# Patient Record
Sex: Female | Born: 1955 | Race: Black or African American | Hispanic: No | Marital: Single | Smoking: Former smoker
Health system: Southern US, Community
[De-identification: ages and names within clinical notes are randomized; demographics above are authoritative.]

## PROBLEM LIST (undated history)

## (undated) DIAGNOSIS — D649 Anemia, unspecified: Secondary | ICD-10-CM

## (undated) DIAGNOSIS — E119 Type 2 diabetes mellitus without complications: Secondary | ICD-10-CM

## (undated) DIAGNOSIS — Z90711 Acquired absence of uterus with remaining cervical stump: Secondary | ICD-10-CM

## (undated) DIAGNOSIS — I340 Nonrheumatic mitral (valve) insufficiency: Secondary | ICD-10-CM

## (undated) DIAGNOSIS — Z803 Family history of malignant neoplasm of breast: Secondary | ICD-10-CM

## (undated) DIAGNOSIS — G709 Myoneural disorder, unspecified: Secondary | ICD-10-CM

## (undated) DIAGNOSIS — I10 Essential (primary) hypertension: Secondary | ICD-10-CM

## (undated) DIAGNOSIS — J45909 Unspecified asthma, uncomplicated: Secondary | ICD-10-CM

## (undated) DIAGNOSIS — C801 Malignant (primary) neoplasm, unspecified: Secondary | ICD-10-CM

## (undated) DIAGNOSIS — Z8041 Family history of malignant neoplasm of ovary: Secondary | ICD-10-CM

## (undated) DIAGNOSIS — I639 Cerebral infarction, unspecified: Secondary | ICD-10-CM

## (undated) DIAGNOSIS — F419 Anxiety disorder, unspecified: Secondary | ICD-10-CM

## (undated) DIAGNOSIS — I509 Heart failure, unspecified: Secondary | ICD-10-CM

## (undated) DIAGNOSIS — K219 Gastro-esophageal reflux disease without esophagitis: Secondary | ICD-10-CM

## (undated) DIAGNOSIS — F329 Major depressive disorder, single episode, unspecified: Secondary | ICD-10-CM

## (undated) DIAGNOSIS — Z853 Personal history of malignant neoplasm of breast: Secondary | ICD-10-CM

## (undated) DIAGNOSIS — L659 Nonscarring hair loss, unspecified: Secondary | ICD-10-CM

## (undated) DIAGNOSIS — K859 Acute pancreatitis without necrosis or infection, unspecified: Secondary | ICD-10-CM

## (undated) DIAGNOSIS — Z800 Family history of malignant neoplasm of digestive organs: Secondary | ICD-10-CM

## (undated) DIAGNOSIS — E669 Obesity, unspecified: Secondary | ICD-10-CM

## (undated) DIAGNOSIS — Z8042 Family history of malignant neoplasm of prostate: Secondary | ICD-10-CM

## (undated) DIAGNOSIS — F32A Depression, unspecified: Secondary | ICD-10-CM

## (undated) DIAGNOSIS — N189 Chronic kidney disease, unspecified: Secondary | ICD-10-CM

## (undated) HISTORY — DX: Family history of malignant neoplasm of ovary: Z80.41

## (undated) HISTORY — DX: Type 2 diabetes mellitus without complications: E11.9

## (undated) HISTORY — DX: Obesity, unspecified: E66.9

## (undated) HISTORY — DX: Family history of malignant neoplasm of digestive organs: Z80.0

## (undated) HISTORY — PX: TUBAL LIGATION: SHX77

## (undated) HISTORY — DX: Family history of malignant neoplasm of prostate: Z80.42

## (undated) HISTORY — DX: Acute pancreatitis without necrosis or infection, unspecified: K85.90

## (undated) HISTORY — DX: Cerebral infarction, unspecified: I63.9

## (undated) HISTORY — DX: Personal history of malignant neoplasm of breast: Z85.3

## (undated) HISTORY — DX: Heart failure, unspecified: I50.9

## (undated) HISTORY — PX: PORTA CATH INSERTION: CATH118285

## (undated) HISTORY — PX: EYE SURGERY: SHX253

## (undated) HISTORY — DX: Chronic kidney disease, unspecified: N18.9

## (undated) HISTORY — PX: CHOLECYSTECTOMY: SHX55

## (undated) HISTORY — DX: Unspecified asthma, uncomplicated: J45.909

## (undated) HISTORY — DX: Major depressive disorder, single episode, unspecified: F32.9

## (undated) HISTORY — DX: Nonscarring hair loss, unspecified: L65.9

## (undated) HISTORY — DX: Depression, unspecified: F32.A

## (undated) HISTORY — DX: Family history of malignant neoplasm of breast: Z80.3

---

## 1995-08-05 DIAGNOSIS — I509 Heart failure, unspecified: Secondary | ICD-10-CM

## 1995-08-05 DIAGNOSIS — K859 Acute pancreatitis without necrosis or infection, unspecified: Secondary | ICD-10-CM

## 1995-08-05 HISTORY — DX: Acute pancreatitis without necrosis or infection, unspecified: K85.90

## 1995-08-05 HISTORY — DX: Heart failure, unspecified: I50.9

## 2004-06-13 ENCOUNTER — Emergency Department: Payer: Self-pay | Admitting: Emergency Medicine

## 2004-06-13 ENCOUNTER — Other Ambulatory Visit: Payer: Self-pay

## 2004-08-03 ENCOUNTER — Emergency Department: Payer: Self-pay | Admitting: Emergency Medicine

## 2007-11-30 ENCOUNTER — Emergency Department: Payer: Self-pay | Admitting: Internal Medicine

## 2007-11-30 ENCOUNTER — Other Ambulatory Visit: Payer: Self-pay

## 2008-08-04 DIAGNOSIS — I639 Cerebral infarction, unspecified: Secondary | ICD-10-CM

## 2008-08-04 HISTORY — DX: Cerebral infarction, unspecified: I63.9

## 2010-09-22 ENCOUNTER — Emergency Department: Payer: Self-pay | Admitting: Emergency Medicine

## 2013-10-17 DIAGNOSIS — Z79899 Other long term (current) drug therapy: Secondary | ICD-10-CM | POA: Diagnosis not present

## 2013-10-18 DIAGNOSIS — E119 Type 2 diabetes mellitus without complications: Secondary | ICD-10-CM | POA: Diagnosis not present

## 2013-10-18 DIAGNOSIS — I10 Essential (primary) hypertension: Secondary | ICD-10-CM | POA: Diagnosis not present

## 2013-10-18 DIAGNOSIS — M549 Dorsalgia, unspecified: Secondary | ICD-10-CM | POA: Diagnosis not present

## 2013-10-18 DIAGNOSIS — I509 Heart failure, unspecified: Secondary | ICD-10-CM | POA: Diagnosis not present

## 2014-03-25 DIAGNOSIS — Z23 Encounter for immunization: Secondary | ICD-10-CM | POA: Diagnosis not present

## 2014-04-27 DIAGNOSIS — E119 Type 2 diabetes mellitus without complications: Secondary | ICD-10-CM | POA: Diagnosis not present

## 2014-04-27 DIAGNOSIS — I5022 Chronic systolic (congestive) heart failure: Secondary | ICD-10-CM | POA: Diagnosis not present

## 2014-04-27 DIAGNOSIS — I10 Essential (primary) hypertension: Secondary | ICD-10-CM | POA: Diagnosis not present

## 2014-04-27 DIAGNOSIS — I509 Heart failure, unspecified: Secondary | ICD-10-CM | POA: Diagnosis not present

## 2014-05-22 ENCOUNTER — Emergency Department: Payer: Self-pay | Admitting: Emergency Medicine

## 2014-05-22 ENCOUNTER — Ambulatory Visit: Payer: Self-pay | Admitting: Internal Medicine

## 2014-05-22 DIAGNOSIS — N643 Galactorrhea not associated with childbirth: Secondary | ICD-10-CM | POA: Diagnosis not present

## 2014-05-22 DIAGNOSIS — R1110 Vomiting, unspecified: Secondary | ICD-10-CM | POA: Diagnosis not present

## 2014-05-22 DIAGNOSIS — N6452 Nipple discharge: Secondary | ICD-10-CM | POA: Diagnosis not present

## 2014-05-22 DIAGNOSIS — Z7982 Long term (current) use of aspirin: Secondary | ICD-10-CM | POA: Diagnosis not present

## 2014-05-22 DIAGNOSIS — I10 Essential (primary) hypertension: Secondary | ICD-10-CM | POA: Diagnosis not present

## 2014-05-22 DIAGNOSIS — Z87891 Personal history of nicotine dependence: Secondary | ICD-10-CM | POA: Diagnosis not present

## 2014-05-22 DIAGNOSIS — E119 Type 2 diabetes mellitus without complications: Secondary | ICD-10-CM | POA: Diagnosis not present

## 2014-05-22 DIAGNOSIS — Z79899 Other long term (current) drug therapy: Secondary | ICD-10-CM | POA: Diagnosis not present

## 2014-05-29 ENCOUNTER — Ambulatory Visit: Payer: Self-pay | Admitting: Internal Medicine

## 2014-05-29 DIAGNOSIS — C773 Secondary and unspecified malignant neoplasm of axilla and upper limb lymph nodes: Secondary | ICD-10-CM | POA: Diagnosis not present

## 2014-05-29 DIAGNOSIS — Z853 Personal history of malignant neoplasm of breast: Secondary | ICD-10-CM

## 2014-05-29 DIAGNOSIS — C50012 Malignant neoplasm of nipple and areola, left female breast: Secondary | ICD-10-CM | POA: Diagnosis not present

## 2014-05-29 DIAGNOSIS — C50412 Malignant neoplasm of upper-outer quadrant of left female breast: Secondary | ICD-10-CM | POA: Diagnosis not present

## 2014-05-29 DIAGNOSIS — N63 Unspecified lump in breast: Secondary | ICD-10-CM | POA: Diagnosis not present

## 2014-05-29 HISTORY — DX: Personal history of malignant neoplasm of breast: Z85.3

## 2014-06-06 ENCOUNTER — Ambulatory Visit: Payer: Self-pay | Admitting: Internal Medicine

## 2014-06-06 DIAGNOSIS — Z7982 Long term (current) use of aspirin: Secondary | ICD-10-CM | POA: Diagnosis not present

## 2014-06-06 DIAGNOSIS — N189 Chronic kidney disease, unspecified: Secondary | ICD-10-CM | POA: Diagnosis not present

## 2014-06-06 DIAGNOSIS — Z87891 Personal history of nicotine dependence: Secondary | ICD-10-CM | POA: Diagnosis not present

## 2014-06-06 DIAGNOSIS — I69354 Hemiplegia and hemiparesis following cerebral infarction affecting left non-dominant side: Secondary | ICD-10-CM | POA: Diagnosis not present

## 2014-06-06 DIAGNOSIS — E119 Type 2 diabetes mellitus without complications: Secondary | ICD-10-CM | POA: Diagnosis not present

## 2014-06-06 DIAGNOSIS — C773 Secondary and unspecified malignant neoplasm of axilla and upper limb lymph nodes: Secondary | ICD-10-CM | POA: Diagnosis not present

## 2014-06-06 DIAGNOSIS — I129 Hypertensive chronic kidney disease with stage 1 through stage 4 chronic kidney disease, or unspecified chronic kidney disease: Secondary | ICD-10-CM | POA: Diagnosis not present

## 2014-06-06 DIAGNOSIS — I509 Heart failure, unspecified: Secondary | ICD-10-CM | POA: Diagnosis not present

## 2014-06-06 DIAGNOSIS — Z170 Estrogen receptor positive status [ER+]: Secondary | ICD-10-CM | POA: Diagnosis not present

## 2014-06-06 DIAGNOSIS — F329 Major depressive disorder, single episode, unspecified: Secondary | ICD-10-CM | POA: Diagnosis not present

## 2014-06-06 DIAGNOSIS — Z79899 Other long term (current) drug therapy: Secondary | ICD-10-CM | POA: Diagnosis not present

## 2014-06-06 DIAGNOSIS — C50412 Malignant neoplasm of upper-outer quadrant of left female breast: Secondary | ICD-10-CM | POA: Diagnosis not present

## 2014-06-06 DIAGNOSIS — E669 Obesity, unspecified: Secondary | ICD-10-CM | POA: Diagnosis not present

## 2014-06-06 LAB — CBC CANCER CENTER
BASOS ABS: 0.1 x10 3/mm (ref 0.0–0.1)
BASOS PCT: 0.9 %
EOS ABS: 0 x10 3/mm (ref 0.0–0.7)
Eosinophil %: 0.4 %
HCT: 37.6 % (ref 35.0–47.0)
HGB: 12.5 g/dL (ref 12.0–16.0)
LYMPHS PCT: 21 %
Lymphocyte #: 2 x10 3/mm (ref 1.0–3.6)
MCH: 30.1 pg (ref 26.0–34.0)
MCHC: 33.1 g/dL (ref 32.0–36.0)
MCV: 91 fL (ref 80–100)
MONO ABS: 0.7 x10 3/mm (ref 0.2–0.9)
MONOS PCT: 6.7 %
Neutrophil #: 6.9 x10 3/mm — ABNORMAL HIGH (ref 1.4–6.5)
Neutrophil %: 71 %
Platelet: 396 x10 3/mm (ref 150–440)
RBC: 4.14 10*6/uL (ref 3.80–5.20)
RDW: 12 % (ref 11.5–14.5)
WBC: 9.7 x10 3/mm (ref 3.6–11.0)

## 2014-06-06 LAB — COMPREHENSIVE METABOLIC PANEL
Albumin: 3.3 g/dL — ABNORMAL LOW (ref 3.4–5.0)
Alkaline Phosphatase: 74 U/L
Anion Gap: 7 (ref 7–16)
BUN: 17 mg/dL (ref 7–18)
Bilirubin,Total: 0.4 mg/dL (ref 0.2–1.0)
CALCIUM: 9.2 mg/dL (ref 8.5–10.1)
CO2: 28 mmol/L (ref 21–32)
Chloride: 95 mmol/L — ABNORMAL LOW (ref 98–107)
Creatinine: 1.63 mg/dL — ABNORMAL HIGH (ref 0.60–1.30)
EGFR (African American): 42 — ABNORMAL LOW
EGFR (Non-African Amer.): 35 — ABNORMAL LOW
Glucose: 349 mg/dL — ABNORMAL HIGH (ref 65–99)
Osmolality: 276 (ref 275–301)
POTASSIUM: 3.9 mmol/L (ref 3.5–5.1)
SGOT(AST): 7 U/L — ABNORMAL LOW (ref 15–37)
SGPT (ALT): 12 U/L — ABNORMAL LOW
Sodium: 130 mmol/L — ABNORMAL LOW (ref 136–145)
Total Protein: 8.2 g/dL (ref 6.4–8.2)

## 2014-06-06 LAB — PROTIME-INR
INR: 1
PROTHROMBIN TIME: 13.3 s (ref 11.5–14.7)

## 2014-06-06 LAB — APTT: Activated PTT: 30.4 secs (ref 23.6–35.9)

## 2014-06-07 LAB — CANCER ANTIGEN 27.29: CA 27.29: 58 U/mL — ABNORMAL HIGH (ref 0.0–38.6)

## 2014-06-09 DIAGNOSIS — N189 Chronic kidney disease, unspecified: Secondary | ICD-10-CM

## 2014-06-09 DIAGNOSIS — N185 Chronic kidney disease, stage 5: Secondary | ICD-10-CM | POA: Insufficient documentation

## 2014-06-09 DIAGNOSIS — F329 Major depressive disorder, single episode, unspecified: Secondary | ICD-10-CM | POA: Insufficient documentation

## 2014-06-09 DIAGNOSIS — J45909 Unspecified asthma, uncomplicated: Secondary | ICD-10-CM | POA: Insufficient documentation

## 2014-06-09 DIAGNOSIS — I5022 Chronic systolic (congestive) heart failure: Secondary | ICD-10-CM | POA: Diagnosis not present

## 2014-06-09 DIAGNOSIS — F32A Depression, unspecified: Secondary | ICD-10-CM | POA: Insufficient documentation

## 2014-06-09 DIAGNOSIS — I63529 Cerebral infarction due to unspecified occlusion or stenosis of unspecified anterior cerebral artery: Secondary | ICD-10-CM | POA: Diagnosis not present

## 2014-06-09 DIAGNOSIS — I10 Essential (primary) hypertension: Secondary | ICD-10-CM | POA: Diagnosis not present

## 2014-06-14 ENCOUNTER — Ambulatory Visit: Payer: Self-pay | Admitting: Internal Medicine

## 2014-06-14 DIAGNOSIS — C50912 Malignant neoplasm of unspecified site of left female breast: Secondary | ICD-10-CM | POA: Diagnosis not present

## 2014-06-14 DIAGNOSIS — C7981 Secondary malignant neoplasm of breast: Secondary | ICD-10-CM | POA: Diagnosis not present

## 2014-06-14 DIAGNOSIS — R911 Solitary pulmonary nodule: Secondary | ICD-10-CM | POA: Diagnosis not present

## 2014-06-14 DIAGNOSIS — R948 Abnormal results of function studies of other organs and systems: Secondary | ICD-10-CM | POA: Diagnosis not present

## 2014-06-16 DIAGNOSIS — Z170 Estrogen receptor positive status [ER+]: Secondary | ICD-10-CM | POA: Diagnosis not present

## 2014-06-16 DIAGNOSIS — C50412 Malignant neoplasm of upper-outer quadrant of left female breast: Secondary | ICD-10-CM | POA: Diagnosis not present

## 2014-06-16 DIAGNOSIS — I129 Hypertensive chronic kidney disease with stage 1 through stage 4 chronic kidney disease, or unspecified chronic kidney disease: Secondary | ICD-10-CM | POA: Diagnosis not present

## 2014-06-16 DIAGNOSIS — C773 Secondary and unspecified malignant neoplasm of axilla and upper limb lymph nodes: Secondary | ICD-10-CM | POA: Diagnosis not present

## 2014-06-16 DIAGNOSIS — N189 Chronic kidney disease, unspecified: Secondary | ICD-10-CM | POA: Diagnosis not present

## 2014-06-16 DIAGNOSIS — E119 Type 2 diabetes mellitus without complications: Secondary | ICD-10-CM | POA: Diagnosis not present

## 2014-06-19 ENCOUNTER — Ambulatory Visit: Payer: Self-pay | Admitting: Internal Medicine

## 2014-06-19 DIAGNOSIS — Z01810 Encounter for preprocedural cardiovascular examination: Secondary | ICD-10-CM | POA: Diagnosis not present

## 2014-06-19 DIAGNOSIS — I10 Essential (primary) hypertension: Secondary | ICD-10-CM | POA: Diagnosis not present

## 2014-06-19 DIAGNOSIS — Z01812 Encounter for preprocedural laboratory examination: Secondary | ICD-10-CM | POA: Diagnosis not present

## 2014-06-19 LAB — DRUG SCREEN, URINE
Amphetamines, Ur Screen: NEGATIVE (ref ?–1000)
BARBITURATES, UR SCREEN: NEGATIVE (ref ?–200)
Benzodiazepine, Ur Scrn: NEGATIVE (ref ?–200)
Cannabinoid 50 Ng, Ur ~~LOC~~: NEGATIVE (ref ?–50)
Cocaine Metabolite,Ur ~~LOC~~: NEGATIVE (ref ?–300)
MDMA (ECSTASY) UR SCREEN: NEGATIVE (ref ?–500)
METHADONE, UR SCREEN: NEGATIVE (ref ?–300)
OPIATE, UR SCREEN: NEGATIVE (ref ?–300)
Phencyclidine (PCP) Ur S: NEGATIVE (ref ?–25)
Tricyclic, Ur Screen: NEGATIVE (ref ?–1000)

## 2014-06-20 DIAGNOSIS — I5022 Chronic systolic (congestive) heart failure: Secondary | ICD-10-CM | POA: Diagnosis not present

## 2014-06-21 ENCOUNTER — Ambulatory Visit: Payer: Self-pay | Admitting: Internal Medicine

## 2014-06-21 DIAGNOSIS — I5022 Chronic systolic (congestive) heart failure: Secondary | ICD-10-CM | POA: Insufficient documentation

## 2014-06-21 DIAGNOSIS — Z7982 Long term (current) use of aspirin: Secondary | ICD-10-CM | POA: Diagnosis not present

## 2014-06-21 DIAGNOSIS — Z87891 Personal history of nicotine dependence: Secondary | ICD-10-CM | POA: Diagnosis not present

## 2014-06-21 DIAGNOSIS — E782 Mixed hyperlipidemia: Secondary | ICD-10-CM | POA: Diagnosis not present

## 2014-06-21 DIAGNOSIS — Z8673 Personal history of transient ischemic attack (TIA), and cerebral infarction without residual deficits: Secondary | ICD-10-CM | POA: Diagnosis not present

## 2014-06-21 DIAGNOSIS — Z8249 Family history of ischemic heart disease and other diseases of the circulatory system: Secondary | ICD-10-CM | POA: Diagnosis not present

## 2014-06-21 DIAGNOSIS — Z833 Family history of diabetes mellitus: Secondary | ICD-10-CM | POA: Diagnosis not present

## 2014-06-21 DIAGNOSIS — I340 Nonrheumatic mitral (valve) insufficiency: Secondary | ICD-10-CM | POA: Diagnosis not present

## 2014-06-21 DIAGNOSIS — C781 Secondary malignant neoplasm of mediastinum: Secondary | ICD-10-CM | POA: Diagnosis not present

## 2014-06-21 DIAGNOSIS — I10 Essential (primary) hypertension: Secondary | ICD-10-CM | POA: Diagnosis not present

## 2014-06-21 DIAGNOSIS — Z79899 Other long term (current) drug therapy: Secondary | ICD-10-CM | POA: Diagnosis not present

## 2014-06-21 DIAGNOSIS — C771 Secondary and unspecified malignant neoplasm of intrathoracic lymph nodes: Secondary | ICD-10-CM | POA: Diagnosis not present

## 2014-06-21 DIAGNOSIS — J45909 Unspecified asthma, uncomplicated: Secondary | ICD-10-CM | POA: Diagnosis not present

## 2014-06-21 DIAGNOSIS — C50912 Malignant neoplasm of unspecified site of left female breast: Secondary | ICD-10-CM | POA: Diagnosis not present

## 2014-06-21 DIAGNOSIS — N289 Disorder of kidney and ureter, unspecified: Secondary | ICD-10-CM | POA: Diagnosis not present

## 2014-06-21 DIAGNOSIS — E119 Type 2 diabetes mellitus without complications: Secondary | ICD-10-CM | POA: Diagnosis not present

## 2014-06-21 DIAGNOSIS — R599 Enlarged lymph nodes, unspecified: Secondary | ICD-10-CM | POA: Diagnosis not present

## 2014-06-21 DIAGNOSIS — K279 Peptic ulcer, site unspecified, unspecified as acute or chronic, without hemorrhage or perforation: Secondary | ICD-10-CM | POA: Diagnosis not present

## 2014-06-21 DIAGNOSIS — R591 Generalized enlarged lymph nodes: Secondary | ICD-10-CM | POA: Diagnosis not present

## 2014-06-21 DIAGNOSIS — E669 Obesity, unspecified: Secondary | ICD-10-CM | POA: Diagnosis not present

## 2014-07-04 ENCOUNTER — Ambulatory Visit: Payer: Self-pay | Admitting: Internal Medicine

## 2014-07-08 DIAGNOSIS — K859 Acute pancreatitis, unspecified: Secondary | ICD-10-CM | POA: Diagnosis not present

## 2014-07-08 DIAGNOSIS — C50919 Malignant neoplasm of unspecified site of unspecified female breast: Secondary | ICD-10-CM | POA: Diagnosis not present

## 2014-07-08 DIAGNOSIS — E119 Type 2 diabetes mellitus without complications: Secondary | ICD-10-CM | POA: Diagnosis not present

## 2014-07-08 DIAGNOSIS — E785 Hyperlipidemia, unspecified: Secondary | ICD-10-CM | POA: Diagnosis not present

## 2014-07-10 DIAGNOSIS — C50919 Malignant neoplasm of unspecified site of unspecified female breast: Secondary | ICD-10-CM | POA: Diagnosis not present

## 2014-07-10 DIAGNOSIS — I10 Essential (primary) hypertension: Secondary | ICD-10-CM | POA: Diagnosis not present

## 2014-07-10 DIAGNOSIS — G629 Polyneuropathy, unspecified: Secondary | ICD-10-CM | POA: Diagnosis not present

## 2014-07-10 DIAGNOSIS — E119 Type 2 diabetes mellitus without complications: Secondary | ICD-10-CM | POA: Diagnosis not present

## 2014-07-10 DIAGNOSIS — E785 Hyperlipidemia, unspecified: Secondary | ICD-10-CM | POA: Diagnosis not present

## 2014-07-10 DIAGNOSIS — K859 Acute pancreatitis, unspecified: Secondary | ICD-10-CM | POA: Diagnosis not present

## 2014-07-10 DIAGNOSIS — C50912 Malignant neoplasm of unspecified site of left female breast: Secondary | ICD-10-CM | POA: Diagnosis not present

## 2014-07-20 DIAGNOSIS — F329 Major depressive disorder, single episode, unspecified: Secondary | ICD-10-CM | POA: Diagnosis not present

## 2014-07-20 DIAGNOSIS — J45909 Unspecified asthma, uncomplicated: Secondary | ICD-10-CM | POA: Diagnosis not present

## 2014-07-20 DIAGNOSIS — E119 Type 2 diabetes mellitus without complications: Secondary | ICD-10-CM | POA: Diagnosis not present

## 2014-07-20 DIAGNOSIS — I340 Nonrheumatic mitral (valve) insufficiency: Secondary | ICD-10-CM | POA: Diagnosis not present

## 2014-07-20 DIAGNOSIS — Z452 Encounter for adjustment and management of vascular access device: Secondary | ICD-10-CM | POA: Diagnosis not present

## 2014-07-20 DIAGNOSIS — E785 Hyperlipidemia, unspecified: Secondary | ICD-10-CM | POA: Diagnosis not present

## 2014-07-20 DIAGNOSIS — I10 Essential (primary) hypertension: Secondary | ICD-10-CM | POA: Diagnosis not present

## 2014-07-20 DIAGNOSIS — Z87891 Personal history of nicotine dependence: Secondary | ICD-10-CM | POA: Diagnosis not present

## 2014-07-20 DIAGNOSIS — I509 Heart failure, unspecified: Secondary | ICD-10-CM | POA: Diagnosis not present

## 2014-07-20 DIAGNOSIS — C50912 Malignant neoplasm of unspecified site of left female breast: Secondary | ICD-10-CM | POA: Diagnosis not present

## 2014-07-20 DIAGNOSIS — Z8673 Personal history of transient ischemic attack (TIA), and cerebral infarction without residual deficits: Secondary | ICD-10-CM | POA: Diagnosis not present

## 2014-07-24 DIAGNOSIS — E119 Type 2 diabetes mellitus without complications: Secondary | ICD-10-CM | POA: Diagnosis not present

## 2014-07-24 DIAGNOSIS — I509 Heart failure, unspecified: Secondary | ICD-10-CM | POA: Diagnosis not present

## 2014-07-24 DIAGNOSIS — J45909 Unspecified asthma, uncomplicated: Secondary | ICD-10-CM | POA: Diagnosis not present

## 2014-07-24 DIAGNOSIS — I10 Essential (primary) hypertension: Secondary | ICD-10-CM | POA: Diagnosis not present

## 2014-07-24 DIAGNOSIS — C50912 Malignant neoplasm of unspecified site of left female breast: Secondary | ICD-10-CM | POA: Diagnosis not present

## 2014-08-08 DIAGNOSIS — C50212 Malignant neoplasm of upper-inner quadrant of left female breast: Secondary | ICD-10-CM | POA: Diagnosis not present

## 2014-08-18 DIAGNOSIS — C50912 Malignant neoplasm of unspecified site of left female breast: Secondary | ICD-10-CM | POA: Diagnosis not present

## 2014-08-25 DIAGNOSIS — D708 Other neutropenia: Secondary | ICD-10-CM | POA: Diagnosis not present

## 2014-08-31 DIAGNOSIS — C50212 Malignant neoplasm of upper-inner quadrant of left female breast: Secondary | ICD-10-CM | POA: Diagnosis not present

## 2014-08-31 DIAGNOSIS — D708 Other neutropenia: Secondary | ICD-10-CM | POA: Diagnosis not present

## 2014-09-12 DIAGNOSIS — C50912 Malignant neoplasm of unspecified site of left female breast: Secondary | ICD-10-CM | POA: Diagnosis not present

## 2014-09-19 DIAGNOSIS — C50912 Malignant neoplasm of unspecified site of left female breast: Secondary | ICD-10-CM | POA: Diagnosis not present

## 2014-09-21 DIAGNOSIS — Z170 Estrogen receptor positive status [ER+]: Secondary | ICD-10-CM | POA: Diagnosis not present

## 2014-09-21 DIAGNOSIS — C50212 Malignant neoplasm of upper-inner quadrant of left female breast: Secondary | ICD-10-CM | POA: Diagnosis not present

## 2014-09-26 DIAGNOSIS — D708 Other neutropenia: Secondary | ICD-10-CM | POA: Diagnosis not present

## 2014-09-30 DIAGNOSIS — I10 Essential (primary) hypertension: Secondary | ICD-10-CM | POA: Diagnosis not present

## 2014-10-03 DIAGNOSIS — D708 Other neutropenia: Secondary | ICD-10-CM | POA: Diagnosis not present

## 2014-10-10 DIAGNOSIS — C50912 Malignant neoplasm of unspecified site of left female breast: Secondary | ICD-10-CM | POA: Diagnosis not present

## 2014-10-17 DIAGNOSIS — C50912 Malignant neoplasm of unspecified site of left female breast: Secondary | ICD-10-CM | POA: Diagnosis not present

## 2014-10-17 DIAGNOSIS — D708 Other neutropenia: Secondary | ICD-10-CM | POA: Diagnosis not present

## 2014-10-24 DIAGNOSIS — D708 Other neutropenia: Secondary | ICD-10-CM | POA: Diagnosis not present

## 2014-10-24 DIAGNOSIS — Z170 Estrogen receptor positive status [ER+]: Secondary | ICD-10-CM | POA: Diagnosis not present

## 2014-10-24 DIAGNOSIS — C50212 Malignant neoplasm of upper-inner quadrant of left female breast: Secondary | ICD-10-CM | POA: Diagnosis not present

## 2014-10-31 DIAGNOSIS — C50912 Malignant neoplasm of unspecified site of left female breast: Secondary | ICD-10-CM | POA: Diagnosis not present

## 2014-11-07 DIAGNOSIS — C50912 Malignant neoplasm of unspecified site of left female breast: Secondary | ICD-10-CM | POA: Diagnosis not present

## 2014-11-07 DIAGNOSIS — D708 Other neutropenia: Secondary | ICD-10-CM | POA: Diagnosis not present

## 2014-11-14 DIAGNOSIS — D708 Other neutropenia: Secondary | ICD-10-CM | POA: Diagnosis not present

## 2014-11-21 DIAGNOSIS — C50912 Malignant neoplasm of unspecified site of left female breast: Secondary | ICD-10-CM | POA: Diagnosis not present

## 2014-11-27 LAB — SURGICAL PATHOLOGY

## 2014-11-28 DIAGNOSIS — Z170 Estrogen receptor positive status [ER+]: Secondary | ICD-10-CM | POA: Diagnosis not present

## 2014-11-28 DIAGNOSIS — Z09 Encounter for follow-up examination after completed treatment for conditions other than malignant neoplasm: Secondary | ICD-10-CM | POA: Diagnosis not present

## 2014-11-28 DIAGNOSIS — D708 Other neutropenia: Secondary | ICD-10-CM | POA: Diagnosis not present

## 2014-11-28 DIAGNOSIS — C50212 Malignant neoplasm of upper-inner quadrant of left female breast: Secondary | ICD-10-CM | POA: Diagnosis not present

## 2014-12-05 DIAGNOSIS — D708 Other neutropenia: Secondary | ICD-10-CM | POA: Diagnosis not present

## 2014-12-12 DIAGNOSIS — C50912 Malignant neoplasm of unspecified site of left female breast: Secondary | ICD-10-CM | POA: Diagnosis not present

## 2014-12-15 DIAGNOSIS — I10 Essential (primary) hypertension: Secondary | ICD-10-CM | POA: Insufficient documentation

## 2014-12-18 DIAGNOSIS — C50212 Malignant neoplasm of upper-inner quadrant of left female breast: Secondary | ICD-10-CM | POA: Diagnosis not present

## 2015-01-02 DIAGNOSIS — C50212 Malignant neoplasm of upper-inner quadrant of left female breast: Secondary | ICD-10-CM | POA: Diagnosis not present

## 2015-01-02 DIAGNOSIS — D708 Other neutropenia: Secondary | ICD-10-CM | POA: Diagnosis not present

## 2015-01-02 DIAGNOSIS — Z09 Encounter for follow-up examination after completed treatment for conditions other than malignant neoplasm: Secondary | ICD-10-CM | POA: Diagnosis not present

## 2015-01-02 DIAGNOSIS — Z170 Estrogen receptor positive status [ER+]: Secondary | ICD-10-CM | POA: Diagnosis not present

## 2015-01-06 DIAGNOSIS — I10 Essential (primary) hypertension: Secondary | ICD-10-CM | POA: Diagnosis not present

## 2015-01-06 DIAGNOSIS — Z131 Encounter for screening for diabetes mellitus: Secondary | ICD-10-CM | POA: Diagnosis not present

## 2015-01-06 DIAGNOSIS — O1201 Gestational edema, first trimester: Secondary | ICD-10-CM | POA: Diagnosis not present

## 2015-01-06 DIAGNOSIS — C50919 Malignant neoplasm of unspecified site of unspecified female breast: Secondary | ICD-10-CM | POA: Diagnosis not present

## 2015-01-23 DIAGNOSIS — C50912 Malignant neoplasm of unspecified site of left female breast: Secondary | ICD-10-CM | POA: Diagnosis not present

## 2015-02-22 DIAGNOSIS — Z09 Encounter for follow-up examination after completed treatment for conditions other than malignant neoplasm: Secondary | ICD-10-CM | POA: Diagnosis not present

## 2015-02-22 DIAGNOSIS — D708 Other neutropenia: Secondary | ICD-10-CM | POA: Diagnosis not present

## 2015-02-22 DIAGNOSIS — C50212 Malignant neoplasm of upper-inner quadrant of left female breast: Secondary | ICD-10-CM | POA: Diagnosis not present

## 2015-03-03 DIAGNOSIS — I10 Essential (primary) hypertension: Secondary | ICD-10-CM | POA: Diagnosis not present

## 2015-03-03 DIAGNOSIS — E119 Type 2 diabetes mellitus without complications: Secondary | ICD-10-CM | POA: Diagnosis not present

## 2015-03-03 DIAGNOSIS — E785 Hyperlipidemia, unspecified: Secondary | ICD-10-CM | POA: Diagnosis not present

## 2015-03-06 DIAGNOSIS — C50912 Malignant neoplasm of unspecified site of left female breast: Secondary | ICD-10-CM | POA: Diagnosis not present

## 2015-04-23 DIAGNOSIS — C50212 Malignant neoplasm of upper-inner quadrant of left female breast: Secondary | ICD-10-CM | POA: Diagnosis not present

## 2015-05-03 DIAGNOSIS — Z170 Estrogen receptor positive status [ER+]: Secondary | ICD-10-CM | POA: Diagnosis not present

## 2015-05-03 DIAGNOSIS — C50212 Malignant neoplasm of upper-inner quadrant of left female breast: Secondary | ICD-10-CM | POA: Diagnosis not present

## 2015-05-03 DIAGNOSIS — D708 Other neutropenia: Secondary | ICD-10-CM | POA: Diagnosis not present

## 2015-05-03 DIAGNOSIS — Z09 Encounter for follow-up examination after completed treatment for conditions other than malignant neoplasm: Secondary | ICD-10-CM | POA: Diagnosis not present

## 2015-05-19 DIAGNOSIS — Z23 Encounter for immunization: Secondary | ICD-10-CM | POA: Diagnosis not present

## 2015-06-14 ENCOUNTER — Telehealth: Payer: Self-pay | Admitting: Internal Medicine

## 2015-06-14 NOTE — Telephone Encounter (Signed)
This former patient saw Dr. Ma Hillock twice last year and then moved to [facility]. She would like to return and enter Dr. Aletha Halim clinic. Do we need a fresh referral or can we just schedule her? She said she has her scans on disc and most of her records. Please advise.

## 2015-06-18 NOTE — Telephone Encounter (Signed)
Apt was scheduled as directed. Patient will need to bring all records, pathology, scans to the appointment.

## 2015-06-25 ENCOUNTER — Encounter: Payer: Self-pay | Admitting: *Deleted

## 2015-06-25 ENCOUNTER — Inpatient Hospital Stay: Payer: Medicare Other | Attending: Internal Medicine | Admitting: Internal Medicine

## 2015-06-25 VITALS — BP 177/95 | HR 80 | Temp 97.8°F | Wt 185.8 lb

## 2015-06-25 DIAGNOSIS — Z8673 Personal history of transient ischemic attack (TIA), and cerebral infarction without residual deficits: Secondary | ICD-10-CM | POA: Diagnosis not present

## 2015-06-25 DIAGNOSIS — Z170 Estrogen receptor positive status [ER+]: Secondary | ICD-10-CM | POA: Diagnosis not present

## 2015-06-25 DIAGNOSIS — Z7982 Long term (current) use of aspirin: Secondary | ICD-10-CM | POA: Diagnosis not present

## 2015-06-25 DIAGNOSIS — C50912 Malignant neoplasm of unspecified site of left female breast: Secondary | ICD-10-CM | POA: Diagnosis not present

## 2015-06-25 DIAGNOSIS — Z87891 Personal history of nicotine dependence: Secondary | ICD-10-CM | POA: Insufficient documentation

## 2015-06-25 DIAGNOSIS — N189 Chronic kidney disease, unspecified: Secondary | ICD-10-CM | POA: Diagnosis not present

## 2015-06-25 DIAGNOSIS — C771 Secondary and unspecified malignant neoplasm of intrathoracic lymph nodes: Secondary | ICD-10-CM | POA: Diagnosis not present

## 2015-06-25 DIAGNOSIS — E669 Obesity, unspecified: Secondary | ICD-10-CM | POA: Diagnosis not present

## 2015-06-25 DIAGNOSIS — Z79899 Other long term (current) drug therapy: Secondary | ICD-10-CM | POA: Diagnosis not present

## 2015-06-25 DIAGNOSIS — Z79811 Long term (current) use of aromatase inhibitors: Secondary | ICD-10-CM

## 2015-06-25 DIAGNOSIS — Z9221 Personal history of antineoplastic chemotherapy: Secondary | ICD-10-CM | POA: Insufficient documentation

## 2015-06-25 DIAGNOSIS — Z7984 Long term (current) use of oral hypoglycemic drugs: Secondary | ICD-10-CM | POA: Insufficient documentation

## 2015-06-25 DIAGNOSIS — E119 Type 2 diabetes mellitus without complications: Secondary | ICD-10-CM | POA: Diagnosis not present

## 2015-06-25 DIAGNOSIS — J45909 Unspecified asthma, uncomplicated: Secondary | ICD-10-CM

## 2015-06-25 NOTE — Progress Notes (Signed)
[name] is due to re-start her ibrance next Wednesday 11/30. She will get labs drawn next Wednesday and RN will notified her about the results before restarting the [facility].  The patient is taking Ibrance 125 mg daily x 3 weeks and then is off for 1 week. 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 She obtained her femara at [facility]. She currently has 3 RFs on file for both medications. The pt was instructed to contact our office upon refilling the last RX rfs.  She will be consented today for medical records from Dr. Larina Earthly (hem/onc in [place], [place]-ph# [phone])

## 2015-06-25 NOTE — Progress Notes (Signed)
[place] OFFICE PROGRESS NOTE  Patient Care Team: [name], [name]   SUMMARY OF ONCOLOGIC HISTORY:  # OCT 2015-STAGE IV LEFT BREAST T2N1 [T=4cm; [name]; [name]; [name]; EBUS- Positive Paratrac/subcarinal LN s/p ? Taxotere [in [place]; [name]; [name];[name]; [name]; suv 3]  # ? Bony lesions- PET sep 2016-non-hypermetabolic sclerotic lesions T10; Ant R iliac bone; inferior sternum- not on X-geva  INTERVAL HISTORY:  This is my first interaction with the patient since I joined the practice September 2016. I reviewed the patient's prior charts/pertinent labs/imaging in detail; findings are summarized above.   A very pleasant 59 year old female patient with above history of metastatic left breast cancer currently on second line therapy with ibrance plus Femara is here for follow-up.   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 She moved to [place] to be with her family. As per the patient she was treated with chemotherapy with Taxotere 6 cycles; and sometimes in March 2016 she was started on second line therapy with ibrance plus Femara.  Patient denies any side effects from the treatment no diarrhea. No chest pain or shortness of breath or cough. No headaches. Her tingling and numbness.  Patient did not have any surgery.  REVIEW OF SYSTEMS:  A complete 10 point review of system is done which is negative except mentioned above/history of present illness.   PAST MEDICAL HISTORY :  Past Medical History  Diagnosis Date  . History of left breast cancer 05/29/14  . Diabetes mellitus, type 2 ([place])   . Asthma   . Obesity   . Stroke [facility]) 2010    with mild left arm weakness  . Pancreatitis  1997  . CHF (congestive heart failure) ([place]) 1997  . CKD (chronic kidney disease)   . Depression   . Hair loss     PAST SURGICAL HISTORY :   Past Surgical History  Procedure Laterality Date  . Cesarean section    . Cholecystectomy      FAMILY HISTORY :   Family History  Problem Relation Age of Onset  . Diabetes    . Hypertension      SOCIAL HISTORY:   Social History  Substance Use Topics  . Smoking status: Former Smoker -- 0.50 packs/day for 1 years    Types: Cigarettes  . Smokeless tobacco: Never Used  . Alcohol Use: No    ALLERGIES:  has No Known Allergies.  MEDICATIONS:  Current Outpatient Prescriptions  Medication Sig Dispense Refill  . albuterol (PROAIR HFA) 108 (90 BASE) MCG/ACT inhaler Inhale into the lungs.    Marland Kitchen albuterol (PROVENTIL) (2.5 MG/3ML) 0.083% nebulizer solution Inhale into the lungs.    . ALPRAZolam (XANAX) 0.5 MG tablet Take by mouth.    Marland Kitchen amLODipine (NORVASC) 10 MG tablet Take by mouth.    Marland Kitchen aspirin EC 81 MG tablet Take by mouth.    Marland Kitchen atenolol (TENORMIN) 50 MG tablet TAKE 1 1/2 TABLETS BY MOUTH TWICE DAILY    . B-D ULTRA-FINE 33 LANCETS MISC Use 1 each 2 (two) times daily.    . bumetanide (BUMEX) 0.5 MG tablet TAKE 1 TABLET BY MOUTH TWICE DAILY    . cloNIDine (CATAPRES) 0.2 MG tablet Take by mouth.    . enalapril (VASOTEC)  10 MG tablet Take by mouth.    Marland Kitchen FLUoxetine (PROZAC) 20 MG capsule Take by mouth.    Marland Kitchen glucose blood (ONE TOUCH ULTRA TEST) test strip Use 1 each 2 (two) times daily. Use as instructed.    . glyBURIDE (DIABETA) 5 MG tablet Take by mouth.    . metFORMIN (GLUCOPHAGE) 500 MG tablet TAKE 2 TABLETS BY MOUTH TWICE DAILY    . salmeterol (SEREVENT) 50 MCG/DOSE diskus inhaler Inhale 1 puff into the lungs 2 (two) times daily.    . simvastatin (ZOCOR) 20 MG tablet Take by mouth.    Marland Kitchen acetaminophen-codeine (TYLENOL #4) 300-60 MG tablet     . B-D ULTRAFINE III SHORT PEN 31G X 8 MM MISC     . Cinnamon 500 MG capsule Take by mouth.    .  letrozole (FEMARA) 2.5 MG tablet     . LEVEMIR FLEXTOUCH 100 UNIT/ML Pen      No current facility-administered medications for this visit.    PHYSICAL EXAMINATION: ECOG PERFORMANCE STATUS: 0 - Asymptomatic  BP 177/95 mmHg  Pulse 80  Temp(Src) 97.8 F (36.6 C) (Tympanic)  Wt 185 lb 13.6 oz (84.3 kg)  Filed Weights   06/25/15 1352  Weight: 185 lb 13.6 oz (84.3 kg)    GENERAL: Well-nourished well-developed; Alert, no distress and comfortable.   Accompanied by her husband.  EYES: no pallor or icterus OROPHARYNX: no thrush or ulceration; good dentition  NECK: supple, no masses felt LYMPH:  no palpable lymphadenopathy in the cervical, axillary or inguinal regions LUNGS: clear to auscultation and  No wheeze or crackles HEART/CVS: regular rate & rhythm and no murmurs; No lower extremity edema ABDOMEN:abdomen soft, non-tender and normal bowel sounds Musculoskeletal:no cyanosis of digits and no clubbing  PSYCH: alert & oriented x 3 with fluent speech NEURO: no focal motor/sensory deficits SKIN:  no rashes or significant lesions LEFT BREAST exam [in the presence of nurse]- dimpling of the skin noted around 3:00 position. 3 x 3 cm mass noted; mobile [feels softer as per patient]   LABORATORY DATA:  I have reviewed the data as listed    Component Value Date/Time   NA 130* 06/06/2014 1102   K 3.9 06/06/2014 1102   CL 95* 06/06/2014 1102   CO2 28 06/06/2014 1102   GLUCOSE 349* 06/06/2014 1102   BUN 17 06/06/2014 1102   CREATININE 1.63* 06/06/2014 1102   CALCIUM 9.2 06/06/2014 1102   PROT 8.2 06/06/2014 1102   ALBUMIN 3.3* 06/06/2014 1102   AST 7* 06/06/2014 1102   ALT 12* 06/06/2014 1102   ALKPHOS 74 06/06/2014 1102   BILITOT 0.4 06/06/2014 1102   GFRNONAA 35* 06/06/2014 1102   GFRAA 42* 06/06/2014 1102    No results found for: SPEP, UPEP  Lab Results  Component Value Date   WBC 9.7 06/06/2014   NEUTROABS 6.9* 06/06/2014   HGB 12.5 06/06/2014   HCT 37.6 06/06/2014    MCV 91 06/06/2014   PLT 396 06/06/2014      Chemistry      Component Value Date/Time   NA 130* 06/06/2014 1102   K 3.9 06/06/2014 1102   CL 95* 06/06/2014 1102   CO2 28 06/06/2014 1102   BUN 17 06/06/2014 1102   CREATININE 1.63* 06/06/2014 1102      Component Value Date/Time   CALCIUM 9.2 06/06/2014 1102   ALKPHOS 74 06/06/2014 1102   AST 7* 06/06/2014 1102   ALT 12* 06/06/2014 1102   BILITOT 0.4 06/06/2014 1102  RADIOGRAPHIC STUDIES: I have personally reviewed the radiological images as listed and agreed with the findings in the report. No results found.   ASSESSMENT & PLAN:   # Left breast cancer stage IV/oligometastatic to the mediastinal lymph nodes currently on second line therapy with ibrance and Femara since March 2016. Patient seems to be tolerating therapy well;   I reviewed the recent PET scan images- September 2016 [in [place]] which is compared to a previous PET scan from May 2016 showing approximately 3 cm left breast mass with SUV of 2.3. Also precarinal/subcarinal lymph nodes decreased metabolic stable size.  # Given the continued response to therapy/ patient's good tolerance recommend continuing current therapy with ibrance and Femara. Patient will finish this cycle of treatment tomorrow. I plan to repeat CBC in approximately 1 week/29th- and if labs look at that time she can restart her next cycle.  # Given the oligo metastatic disease- evaluation of possible surgery/radiation to the mediastinal lymph nodes could be considered. This will be discussed with the tumor conference.  # She will plan to follow-up with M.D./in my absence- in approximately 5 weeks/prior to starting next cycle of treatment.  # Patient will follow-up with me in approximately 9 weeks'/ when she is again due for the next treatment cycle.  # Patient will get port flushed next week. This seems to be flushed every 6-8 weeks.  We'll plan to get a CBC CMP prior to each cycle of  treatment.    Orders Placed This Encounter  Procedures  . CBC with Differential    Standing Status: Future     Number of Occurrences:      Standing Expiration Date: 06/24/2016  . Comprehensive metabolic panel    Standing Status: Future     Number of Occurrences:      Standing Expiration Date: 06/24/2016    Order Specific Question:  Has the patient fasted?    Answer:  No  . CBC with Differential    Standing Status: Future     Number of Occurrences:      Standing Expiration Date: 06/24/2016  . Comprehensive metabolic panel    Standing Status: Future     Number of Occurrences:      Standing Expiration Date: 06/24/2016    Order Specific Question:  Has the patient fasted?    Answer:  No  . CBC with Differential    Standing Status: Future     Number of Occurrences:      Standing Expiration Date: 06/24/2016  . Comprehensive metabolic panel    Standing Status: Future     Number of Occurrences:      Standing Expiration Date: 06/24/2016    Order Specific Question:  Has the patient fasted?    Answer:  No   All questions were answered. The patient knows to call the clinic with any problems, questions or concerns. No barriers to learning was detected.  40 minutes face-to-face spent with the patient has been regarding the above medical care/prognosis; with more than 50% of time spent on counseling and coordination of care.     Cammie Sickle, MD 06/25/2015 2:23 PM

## 2015-06-25 NOTE — Progress Notes (Signed)
Patient here today for follow up regarding breast cancer.  Patient brings her last PET disk and written results with her today for MD review.  Patient offers no complaints today.  She does state that she fell last week going out her back door but thinks it was because of a loose shoe string that she tripped over.

## 2015-07-04 ENCOUNTER — Inpatient Hospital Stay: Payer: Medicare Other

## 2015-07-04 DIAGNOSIS — C50912 Malignant neoplasm of unspecified site of left female breast: Secondary | ICD-10-CM | POA: Diagnosis not present

## 2015-07-04 DIAGNOSIS — C801 Malignant (primary) neoplasm, unspecified: Secondary | ICD-10-CM

## 2015-07-04 DIAGNOSIS — Z9221 Personal history of antineoplastic chemotherapy: Secondary | ICD-10-CM | POA: Diagnosis not present

## 2015-07-04 DIAGNOSIS — Z79811 Long term (current) use of aromatase inhibitors: Secondary | ICD-10-CM | POA: Diagnosis not present

## 2015-07-04 DIAGNOSIS — C771 Secondary and unspecified malignant neoplasm of intrathoracic lymph nodes: Secondary | ICD-10-CM | POA: Diagnosis not present

## 2015-07-04 DIAGNOSIS — N189 Chronic kidney disease, unspecified: Secondary | ICD-10-CM | POA: Diagnosis not present

## 2015-07-04 DIAGNOSIS — Z170 Estrogen receptor positive status [ER+]: Secondary | ICD-10-CM | POA: Diagnosis not present

## 2015-07-04 LAB — COMPREHENSIVE METABOLIC PANEL
ALBUMIN: 4.1 g/dL (ref 3.5–5.0)
ALK PHOS: 41 U/L (ref 38–126)
ALT: 12 U/L — AB (ref 14–54)
AST: 19 U/L (ref 15–41)
Anion gap: 10 (ref 5–15)
BILIRUBIN TOTAL: 0.9 mg/dL (ref 0.3–1.2)
BUN: 21 mg/dL — AB (ref 6–20)
CALCIUM: 8.4 mg/dL — AB (ref 8.9–10.3)
CO2: 26 mmol/L (ref 22–32)
Chloride: 99 mmol/L — ABNORMAL LOW (ref 101–111)
Creatinine, Ser: 2 mg/dL — ABNORMAL HIGH (ref 0.44–1.00)
GFR calc Af Amer: 31 mL/min — ABNORMAL LOW (ref >60)
GFR calc non Af Amer: 26 mL/min — ABNORMAL LOW (ref >60)
GLUCOSE: 189 mg/dL — AB (ref 65–99)
Potassium: 3.7 mmol/L (ref 3.5–5.1)
Sodium: 135 mmol/L (ref 135–145)
TOTAL PROTEIN: 7.3 g/dL (ref 6.5–8.1)

## 2015-07-04 LAB — CBC WITH DIFFERENTIAL/PLATELET
BASOS ABS: 0.1 10*3/uL (ref 0–0.1)
BASOS PCT: 2 %
EOS PCT: 1 %
Eosinophils Absolute: 0 10*3/uL (ref 0–0.7)
HEMATOCRIT: 29.6 % — AB (ref 35.0–47.0)
Hemoglobin: 10.2 g/dL — ABNORMAL LOW (ref 12.0–16.0)
LYMPHS PCT: 29 %
Lymphs Abs: 1 10*3/uL (ref 1.0–3.6)
MCH: 38.7 pg — ABNORMAL HIGH (ref 26.0–34.0)
MCHC: 34.4 g/dL (ref 32.0–36.0)
MCV: 112.6 fL — ABNORMAL HIGH (ref 80.0–100.0)
Monocytes Absolute: 0.5 10*3/uL (ref 0.2–0.9)
Monocytes Relative: 13 %
NEUTROS ABS: 2 10*3/uL (ref 1.4–6.5)
Neutrophils Relative %: 55 %
Platelets: 162 10*3/uL (ref 150–440)
RBC: 2.63 MIL/uL — AB (ref 3.80–5.20)
RDW: 14.9 % — ABNORMAL HIGH (ref 11.5–14.5)
WBC: 3.5 10*3/uL — AB (ref 3.6–11.0)

## 2015-07-04 MED ORDER — SODIUM CHLORIDE 0.9 % IJ SOLN
10.0000 mL | INTRAMUSCULAR | Status: DC | PRN
  Administered 2015-07-04: 10 mL via INTRAVENOUS
  Filled 2015-07-04: qty 10

## 2015-07-04 MED ORDER — HEPARIN SOD (PORK) LOCK FLUSH 100 UNIT/ML IV SOLN
INTRAVENOUS | Status: AC
Start: 2015-07-04 — End: 2015-07-04
  Filled 2015-07-04: qty 5

## 2015-07-04 MED ORDER — HEPARIN SOD (PORK) LOCK FLUSH 100 UNIT/ML IV SOLN
500.0000 [IU] | Freq: Once | INTRAVENOUS | Status: AC
  Administered 2015-07-04: 500 [IU] via INTRAVENOUS

## 2015-07-09 ENCOUNTER — Telehealth: Payer: Self-pay | Admitting: Internal Medicine

## 2015-07-09 ENCOUNTER — Telehealth: Payer: Self-pay | Admitting: *Deleted

## 2015-07-09 NOTE — Telephone Encounter (Signed)
Dr. Rogue Bussing, when do you want patient to restart her Ibrance?

## 2015-07-09 NOTE — Telephone Encounter (Signed)
I reviewed the labs patient's CBC -shows white count of 3.5 hemoglobin 10.2 platelets normal at 162. Absolute neutrophil count is 2.0. Creatinine slightly elevated at 2.0/slightly elevated from baseline of 1.67 a year ago.  # Recommend to start taking ibrance; also recommend drinking fluids. Follow-up is planned.  Please inform patient for recommendations.

## 2015-07-10 NOTE — Telephone Encounter (Signed)
pt instructed by Katrina, RN to restart ibrance. patient instructed to increase fluids and that her labs had improved. Pt told to follow up as planned

## 2015-07-31 ENCOUNTER — Encounter: Payer: Self-pay | Admitting: Internal Medicine

## 2015-07-31 ENCOUNTER — Inpatient Hospital Stay: Payer: Medicare Other | Attending: Internal Medicine | Admitting: Internal Medicine

## 2015-07-31 ENCOUNTER — Inpatient Hospital Stay: Payer: Medicare Other

## 2015-07-31 VITALS — BP 153/92 | HR 78 | Temp 95.4°F | Wt 182.8 lb

## 2015-07-31 DIAGNOSIS — Z87891 Personal history of nicotine dependence: Secondary | ICD-10-CM | POA: Insufficient documentation

## 2015-07-31 DIAGNOSIS — C771 Secondary and unspecified malignant neoplasm of intrathoracic lymph nodes: Secondary | ICD-10-CM | POA: Diagnosis not present

## 2015-07-31 DIAGNOSIS — Z170 Estrogen receptor positive status [ER+]: Secondary | ICD-10-CM | POA: Diagnosis not present

## 2015-07-31 DIAGNOSIS — J45909 Unspecified asthma, uncomplicated: Secondary | ICD-10-CM | POA: Insufficient documentation

## 2015-07-31 DIAGNOSIS — C50912 Malignant neoplasm of unspecified site of left female breast: Secondary | ICD-10-CM

## 2015-07-31 DIAGNOSIS — Z79899 Other long term (current) drug therapy: Secondary | ICD-10-CM | POA: Insufficient documentation

## 2015-07-31 DIAGNOSIS — E119 Type 2 diabetes mellitus without complications: Secondary | ICD-10-CM | POA: Diagnosis not present

## 2015-07-31 DIAGNOSIS — Z8673 Personal history of transient ischemic attack (TIA), and cerebral infarction without residual deficits: Secondary | ICD-10-CM | POA: Insufficient documentation

## 2015-07-31 DIAGNOSIS — Z79811 Long term (current) use of aromatase inhibitors: Secondary | ICD-10-CM

## 2015-07-31 DIAGNOSIS — Z9221 Personal history of antineoplastic chemotherapy: Secondary | ICD-10-CM | POA: Insufficient documentation

## 2015-07-31 DIAGNOSIS — N189 Chronic kidney disease, unspecified: Secondary | ICD-10-CM | POA: Diagnosis not present

## 2015-07-31 LAB — COMPREHENSIVE METABOLIC PANEL
ALBUMIN: 4.3 g/dL (ref 3.5–5.0)
ALK PHOS: 46 U/L (ref 38–126)
ALT: 11 U/L — ABNORMAL LOW (ref 14–54)
ANION GAP: 7 (ref 5–15)
AST: 13 U/L — ABNORMAL LOW (ref 15–41)
BILIRUBIN TOTAL: 1 mg/dL (ref 0.3–1.2)
BUN: 30 mg/dL — ABNORMAL HIGH (ref 6–20)
CALCIUM: 8.4 mg/dL — AB (ref 8.9–10.3)
CO2: 27 mmol/L (ref 22–32)
Chloride: 97 mmol/L — ABNORMAL LOW (ref 101–111)
Creatinine, Ser: 2.51 mg/dL — ABNORMAL HIGH (ref 0.44–1.00)
GFR calc non Af Amer: 20 mL/min — ABNORMAL LOW (ref >60)
GFR, EST AFRICAN AMERICAN: 23 mL/min — AB (ref >60)
GLUCOSE: 339 mg/dL — AB (ref 65–99)
POTASSIUM: 3.9 mmol/L (ref 3.5–5.1)
SODIUM: 131 mmol/L — AB (ref 135–145)
TOTAL PROTEIN: 7.8 g/dL (ref 6.5–8.1)

## 2015-07-31 LAB — CBC WITH DIFFERENTIAL/PLATELET
BASOS PCT: 2 %
Basophils Absolute: 0.1 10*3/uL (ref 0–0.1)
EOS PCT: 0 %
Eosinophils Absolute: 0 10*3/uL (ref 0–0.7)
HCT: 30.3 % — ABNORMAL LOW (ref 35.0–47.0)
HEMOGLOBIN: 10.6 g/dL — AB (ref 12.0–16.0)
LYMPHS PCT: 32 %
Lymphs Abs: 1 10*3/uL (ref 1.0–3.6)
MCH: 38.4 pg — AB (ref 26.0–34.0)
MCHC: 34.8 g/dL (ref 32.0–36.0)
MCV: 110.2 fL — AB (ref 80.0–100.0)
MONO ABS: 0.2 10*3/uL (ref 0.2–0.9)
MONOS PCT: 7 %
NEUTROS ABS: 1.9 10*3/uL (ref 1.4–6.5)
Neutrophils Relative %: 59 %
Platelets: 183 10*3/uL (ref 150–440)
RBC: 2.75 MIL/uL — AB (ref 3.80–5.20)
RDW: 14 % (ref 11.5–14.5)
WBC: 3.2 10*3/uL — ABNORMAL LOW (ref 3.6–11.0)

## 2015-07-31 NOTE — Progress Notes (Signed)
Patient is asking for refill on Clonidine.  States she takes 2 tablets a day instead of one. Patient instructed to call her PCP.

## 2015-07-31 NOTE — Progress Notes (Signed)
[place] OFFICE PROGRESS NOTE  Patient Care Team: [name], [name]   SUMMARY OF ONCOLOGIC HISTORY:  # OCT 2015-STAGE IV LEFT BREAST T2N1 [T=4cm; [name]; [name]; [name]; EBUS- Positive Paratrac/subcarinal LN s/p ? Taxotere [in [place]; [name]; [name];[name]; [name]; suv 3]  # ? Bony lesions- PET sep 2016-non-hypermetabolic sclerotic lesions T10; Ant R iliac bone; inferior sternum- not on X-geva  INTERVAL HISTORY:  A very pleasant 59 year old female patient with above history of metastatic left breast cancer currently on second line therapy with ibrance plus Femara is here for follow-up.   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 She moved to [place] to be with her family. As per the patient she was treated with chemotherapy with Taxotere 6 cycles; and sometimes in March 2016 she was started on second line therapy with ibrance plus Femara.  She has been able to tolerate treatment with Femara and Ibrance remarkably well without any significant fevers, infections, GI disturbance.  REVIEW OF SYSTEMS:  A complete 10 point review of system is done which is negative except mentioned above/history of present illness.   PAST MEDICAL HISTORY :  Past Medical History  Diagnosis Date  . History of left breast cancer 05/29/14  . Diabetes mellitus, type 2 ([place])   . Asthma   . Obesity   . Stroke [facility]) 2010    with mild left arm weakness  . Pancreatitis 1997  . CHF (congestive heart failure) ([place]) 1997  . CKD (chronic kidney disease)   . Depression   . Hair loss     PAST SURGICAL HISTORY :   Past Surgical History  Procedure Laterality Date  . Cesarean section    . Cholecystectomy      FAMILY  HISTORY :   Family History  Problem Relation Age of Onset  . Diabetes    . Hypertension      SOCIAL HISTORY:   Social History  Substance Use Topics  . Smoking status: Former Smoker -- 0.50 packs/day for 1 years    Types: Cigarettes  . Smokeless tobacco: Never Used  . Alcohol Use: No    ALLERGIES:  has No Known Allergies.  MEDICATIONS:  Current Outpatient Prescriptions  Medication Sig Dispense Refill  . acetaminophen-codeine (TYLENOL #4) 300-60 MG tablet     . albuterol (PROAIR HFA) 108 (90 BASE) MCG/ACT inhaler Inhale into the lungs.    Marland Kitchen albuterol (PROVENTIL) (2.5 MG/3ML) 0.083% nebulizer solution Inhale into the lungs.    . ALPRAZolam (XANAX) 0.5 MG tablet Take by mouth.    Marland Kitchen amLODipine (NORVASC) 10 MG tablet Take by mouth.    Marland Kitchen aspirin EC 81 MG tablet Take by mouth.    Marland Kitchen atenolol (TENORMIN) 50 MG tablet TAKE 1 1/2 TABLETS BY MOUTH TWICE DAILY    . B-D ULTRA-FINE 33 LANCETS MISC Use 1 each 2 (two) times daily.    . B-D ULTRAFINE III SHORT PEN 31G X 8 MM MISC     . bumetanide (BUMEX) 0.5 MG tablet TAKE 1 TABLET BY MOUTH TWICE DAILY    . Cinnamon 500 MG capsule Take by mouth.    . cloNIDine (CATAPRES) 0.2 MG tablet Take by mouth.    . enalapril (VASOTEC) 10 MG tablet Take by mouth.    Marland Kitchen  FLUoxetine (PROZAC) 20 MG capsule Take by mouth.    Marland Kitchen glucose blood (ONE TOUCH ULTRA TEST) test strip Use 1 each 2 (two) times daily. Use as instructed.    . glyBURIDE (DIABETA) 5 MG tablet Take by mouth.    . letrozole (FEMARA) 2.5 MG tablet     . LEVEMIR FLEXTOUCH 100 UNIT/ML Pen     . metFORMIN (GLUCOPHAGE) 500 MG tablet TAKE 2 TABLETS BY MOUTH TWICE DAILY    . salmeterol (SEREVENT) 50 MCG/DOSE diskus inhaler Inhale 1 puff into the lungs 2 (two) times daily.    . simvastatin (ZOCOR) 20 MG tablet Take by mouth.     No current facility-administered medications for this visit.    PHYSICAL EXAMINATION: ECOG PERFORMANCE STATUS: 0 - Asymptomatic  BP 153/92 mmHg  Pulse 78  Temp(Src) 95.4  F (35.2 C) (Tympanic)  Wt 182 lb 12.2 oz (82.9 kg)  Filed Weights   07/31/15 1030  Weight: 182 lb 12.2 oz (82.9 kg)  There is no height on file to calculate BMI.   GENERAL: Well-nourished well-developed African-American female; Alert, no distress and comfortable.   Accompanied by her husband.  EYES: no pallor or icterus OROPHARYNX: no thrush or ulceration; good dentition  NECK: supple, no masses felt LYMPH:  no palpable lymphadenopathy in the cervical, axillary or inguinal regions LUNGS: clear to auscultation and  No wheeze or crackles HEART/CVS: regular rate & rhythm and no murmurs; No lower extremity edema ABDOMEN:abdomen soft, non-tender and normal bowel sounds Musculoskeletal:no cyanosis of digits and no clubbing  PSYCH: alert & oriented x 3 with fluent speech NEURO: no focal motor/sensory deficits SKIN:  no rashes or significant lesions LEFT BREAST exam [in the presence of nurse]- dimpling of the skin noted around 3:00 position. 3 x 3 cm mass noted; mobile [feels softer as per patient] she has LABORATORY DATA:  I have reviewed the data as listed    Component Value Date/Time   NA 131* 07/31/2015 1002   NA 130* 06/06/2014 1102   K 3.9 07/31/2015 1002   K 3.9 06/06/2014 1102   CL 97* 07/31/2015 1002   CL 95* 06/06/2014 1102   CO2 27 07/31/2015 1002   CO2 28 06/06/2014 1102   GLUCOSE 339* 07/31/2015 1002   GLUCOSE 349* 06/06/2014 1102   BUN 30* 07/31/2015 1002   BUN 17 06/06/2014 1102   CREATININE 2.51* 07/31/2015 1002   CREATININE 1.63* 06/06/2014 1102   CALCIUM 8.4* 07/31/2015 1002   CALCIUM 9.2 06/06/2014 1102   PROT 7.8 07/31/2015 1002   PROT 8.2 06/06/2014 1102   ALBUMIN 4.3 07/31/2015 1002   ALBUMIN 3.3* 06/06/2014 1102   AST 13* 07/31/2015 1002   AST 7* 06/06/2014 1102   ALT 11* 07/31/2015 1002   ALT 12* 06/06/2014 1102   ALKPHOS 46 07/31/2015 1002   ALKPHOS 74 06/06/2014 1102   BILITOT 1.0 07/31/2015 1002   BILITOT 0.4 06/06/2014 1102   GFRNONAA 20*  07/31/2015 1002   GFRNONAA 35* 06/06/2014 1102   GFRAA 23* 07/31/2015 1002   GFRAA 42* 06/06/2014 1102    No results found for: SPEP, UPEP  Lab Results  Component Value Date   WBC 3.2* 07/31/2015   NEUTROABS 1.9 07/31/2015   HGB 10.6* 07/31/2015   HCT 30.3* 07/31/2015   MCV 110.2* 07/31/2015   PLT 183 07/31/2015      Chemistry      Component Value Date/Time   NA 131* 07/31/2015 1002   NA 130* 06/06/2014 1102  K 3.9 07/31/2015 1002   K 3.9 06/06/2014 1102   CL 97* 07/31/2015 1002   CL 95* 06/06/2014 1102   CO2 27 07/31/2015 1002   CO2 28 06/06/2014 1102   BUN 30* 07/31/2015 1002   BUN 17 06/06/2014 1102   CREATININE 2.51* 07/31/2015 1002   CREATININE 1.63* 06/06/2014 1102      Component Value Date/Time   CALCIUM 8.4* 07/31/2015 1002   CALCIUM 9.2 06/06/2014 1102   ALKPHOS 46 07/31/2015 1002   ALKPHOS 74 06/06/2014 1102   AST 13* 07/31/2015 1002   AST 7* 06/06/2014 1102   ALT 11* 07/31/2015 1002   ALT 12* 06/06/2014 1102   BILITOT 1.0 07/31/2015 1002   BILITOT 0.4 06/06/2014 1102       RADIOGRAPHIC STUDIES: I have personally reviewed the radiological images as listed and agreed with the findings in the report. No results found.   ASSESSMENT & PLAN:   # Left breast cancer stage IV/oligometastatic to the mediastinal lymph nodes currently on second line therapy with ibrance and Femara since March 2016. Patient seems to be tolerating therapy well; she is on her off week today.  We will continue current treatment, since there appears to be response to treatment, and she is able to tolerated remarkably well. She will return to our clinic in 4 weeks, at that point we will check her labs and decide on additional imaging studies. She will continue with Femara, which she also is able to tolerate well.   # Patient will get port flushed next week. This seems to be flushed every 6-8 weeks.  We'll plan to get a CBC CMP prior to each cycle of treatment.    No orders  of the defined types were placed in this encounter.   All questions were answered. The patient knows to call the clinic with any problems, questions or concerns. No barriers to learning was detected.  40 minutes face-to-face spent with the patient has been regarding the above medical care/prognosis; with more than 50% of time spent on counseling and coordination of care.     Roxana Hires, MD 07/31/2015 10:33 AM

## 2015-08-15 ENCOUNTER — Inpatient Hospital Stay: Payer: Medicare Other | Attending: Internal Medicine

## 2015-08-15 DIAGNOSIS — I129 Hypertensive chronic kidney disease with stage 1 through stage 4 chronic kidney disease, or unspecified chronic kidney disease: Secondary | ICD-10-CM | POA: Insufficient documentation

## 2015-08-15 DIAGNOSIS — C801 Malignant (primary) neoplasm, unspecified: Secondary | ICD-10-CM

## 2015-08-15 DIAGNOSIS — C771 Secondary and unspecified malignant neoplasm of intrathoracic lymph nodes: Secondary | ICD-10-CM | POA: Insufficient documentation

## 2015-08-15 DIAGNOSIS — Z79811 Long term (current) use of aromatase inhibitors: Secondary | ICD-10-CM | POA: Insufficient documentation

## 2015-08-15 DIAGNOSIS — Z452 Encounter for adjustment and management of vascular access device: Secondary | ICD-10-CM | POA: Insufficient documentation

## 2015-08-15 DIAGNOSIS — E1165 Type 2 diabetes mellitus with hyperglycemia: Secondary | ICD-10-CM | POA: Diagnosis not present

## 2015-08-15 DIAGNOSIS — Z170 Estrogen receptor positive status [ER+]: Secondary | ICD-10-CM | POA: Insufficient documentation

## 2015-08-15 DIAGNOSIS — Z8673 Personal history of transient ischemic attack (TIA), and cerebral infarction without residual deficits: Secondary | ICD-10-CM | POA: Insufficient documentation

## 2015-08-15 DIAGNOSIS — Z7982 Long term (current) use of aspirin: Secondary | ICD-10-CM | POA: Diagnosis not present

## 2015-08-15 DIAGNOSIS — Z87891 Personal history of nicotine dependence: Secondary | ICD-10-CM | POA: Diagnosis not present

## 2015-08-15 DIAGNOSIS — Z9221 Personal history of antineoplastic chemotherapy: Secondary | ICD-10-CM | POA: Diagnosis not present

## 2015-08-15 DIAGNOSIS — N189 Chronic kidney disease, unspecified: Secondary | ICD-10-CM | POA: Insufficient documentation

## 2015-08-15 DIAGNOSIS — Z79899 Other long term (current) drug therapy: Secondary | ICD-10-CM | POA: Diagnosis not present

## 2015-08-15 DIAGNOSIS — C50912 Malignant neoplasm of unspecified site of left female breast: Secondary | ICD-10-CM | POA: Insufficient documentation

## 2015-08-15 DIAGNOSIS — D649 Anemia, unspecified: Secondary | ICD-10-CM | POA: Diagnosis not present

## 2015-08-15 DIAGNOSIS — Z7984 Long term (current) use of oral hypoglycemic drugs: Secondary | ICD-10-CM | POA: Insufficient documentation

## 2015-08-15 MED ORDER — HEPARIN SOD (PORK) LOCK FLUSH 100 UNIT/ML IV SOLN
INTRAVENOUS | Status: AC
  Filled 2015-08-15: qty 5

## 2015-08-15 MED ORDER — SODIUM CHLORIDE 0.9 % IJ SOLN
10.0000 mL | Freq: Once | INTRAMUSCULAR | Status: AC
Start: 2015-08-15 — End: 2015-08-15
  Administered 2015-08-15: 10 mL via INTRAVENOUS
  Filled 2015-08-15: qty 10

## 2015-08-15 MED ORDER — HEPARIN SOD (PORK) LOCK FLUSH 100 UNIT/ML IV SOLN
500.0000 [IU] | Freq: Once | INTRAVENOUS | Status: AC
  Administered 2015-08-15: 500 [IU] via INTRAVENOUS

## 2015-08-23 DIAGNOSIS — F3342 Major depressive disorder, recurrent, in full remission: Secondary | ICD-10-CM | POA: Diagnosis not present

## 2015-08-23 DIAGNOSIS — M791 Myalgia: Secondary | ICD-10-CM | POA: Diagnosis not present

## 2015-08-23 DIAGNOSIS — I10 Essential (primary) hypertension: Secondary | ICD-10-CM | POA: Diagnosis not present

## 2015-08-23 DIAGNOSIS — E1121 Type 2 diabetes mellitus with diabetic nephropathy: Secondary | ICD-10-CM | POA: Diagnosis not present

## 2015-08-23 DIAGNOSIS — C50412 Malignant neoplasm of upper-outer quadrant of left female breast: Secondary | ICD-10-CM | POA: Diagnosis not present

## 2015-08-23 DIAGNOSIS — Z8673 Personal history of transient ischemic attack (TIA), and cerebral infarction without residual deficits: Secondary | ICD-10-CM | POA: Diagnosis not present

## 2015-08-23 DIAGNOSIS — G8929 Other chronic pain: Secondary | ICD-10-CM | POA: Diagnosis not present

## 2015-08-23 DIAGNOSIS — K861 Other chronic pancreatitis: Secondary | ICD-10-CM | POA: Diagnosis not present

## 2015-08-27 ENCOUNTER — Inpatient Hospital Stay: Payer: Medicare Other

## 2015-08-27 ENCOUNTER — Inpatient Hospital Stay (HOSPITAL_BASED_OUTPATIENT_CLINIC_OR_DEPARTMENT_OTHER): Payer: Medicare Other | Admitting: Internal Medicine

## 2015-08-27 VITALS — BP 146/94 | HR 72 | Temp 95.2°F | Ht 61.0 in | Wt 183.4 lb

## 2015-08-27 DIAGNOSIS — Z170 Estrogen receptor positive status [ER+]: Secondary | ICD-10-CM | POA: Diagnosis not present

## 2015-08-27 DIAGNOSIS — Z9221 Personal history of antineoplastic chemotherapy: Secondary | ICD-10-CM | POA: Diagnosis not present

## 2015-08-27 DIAGNOSIS — E119 Type 2 diabetes mellitus without complications: Secondary | ICD-10-CM

## 2015-08-27 DIAGNOSIS — C50912 Malignant neoplasm of unspecified site of left female breast: Secondary | ICD-10-CM | POA: Diagnosis not present

## 2015-08-27 DIAGNOSIS — Z7982 Long term (current) use of aspirin: Secondary | ICD-10-CM

## 2015-08-27 DIAGNOSIS — Z452 Encounter for adjustment and management of vascular access device: Secondary | ICD-10-CM | POA: Diagnosis not present

## 2015-08-27 DIAGNOSIS — Z7984 Long term (current) use of oral hypoglycemic drugs: Secondary | ICD-10-CM

## 2015-08-27 DIAGNOSIS — D649 Anemia, unspecified: Secondary | ICD-10-CM

## 2015-08-27 DIAGNOSIS — R7989 Other specified abnormal findings of blood chemistry: Secondary | ICD-10-CM

## 2015-08-27 DIAGNOSIS — I129 Hypertensive chronic kidney disease with stage 1 through stage 4 chronic kidney disease, or unspecified chronic kidney disease: Secondary | ICD-10-CM

## 2015-08-27 DIAGNOSIS — N189 Chronic kidney disease, unspecified: Secondary | ICD-10-CM

## 2015-08-27 DIAGNOSIS — E1165 Type 2 diabetes mellitus with hyperglycemia: Secondary | ICD-10-CM

## 2015-08-27 DIAGNOSIS — E1122 Type 2 diabetes mellitus with diabetic chronic kidney disease: Secondary | ICD-10-CM | POA: Insufficient documentation

## 2015-08-27 DIAGNOSIS — C771 Secondary and unspecified malignant neoplasm of intrathoracic lymph nodes: Secondary | ICD-10-CM | POA: Diagnosis not present

## 2015-08-27 DIAGNOSIS — C170 Malignant neoplasm of duodenum: Secondary | ICD-10-CM

## 2015-08-27 DIAGNOSIS — C50412 Malignant neoplasm of upper-outer quadrant of left female breast: Secondary | ICD-10-CM

## 2015-08-27 DIAGNOSIS — Z79811 Long term (current) use of aromatase inhibitors: Secondary | ICD-10-CM | POA: Diagnosis not present

## 2015-08-27 DIAGNOSIS — I639 Cerebral infarction, unspecified: Secondary | ICD-10-CM | POA: Insufficient documentation

## 2015-08-27 DIAGNOSIS — Z79899 Other long term (current) drug therapy: Secondary | ICD-10-CM

## 2015-08-27 LAB — COMPREHENSIVE METABOLIC PANEL
ALT: 11 U/L — AB (ref 14–54)
AST: 13 U/L — AB (ref 15–41)
Albumin: 4.1 g/dL (ref 3.5–5.0)
Alkaline Phosphatase: 42 U/L (ref 38–126)
Anion gap: 9 (ref 5–15)
BUN: 42 mg/dL — AB (ref 6–20)
CHLORIDE: 98 mmol/L — AB (ref 101–111)
CO2: 23 mmol/L (ref 22–32)
CREATININE: 3.64 mg/dL — AB (ref 0.44–1.00)
Calcium: 8.5 mg/dL — ABNORMAL LOW (ref 8.9–10.3)
GFR calc Af Amer: 15 mL/min — ABNORMAL LOW (ref >60)
GFR calc non Af Amer: 13 mL/min — ABNORMAL LOW (ref >60)
Glucose, Bld: 264 mg/dL — ABNORMAL HIGH (ref 65–99)
Potassium: 4.3 mmol/L (ref 3.5–5.1)
SODIUM: 130 mmol/L — AB (ref 135–145)
Total Bilirubin: 0.6 mg/dL (ref 0.3–1.2)
Total Protein: 7.5 g/dL (ref 6.5–8.1)

## 2015-08-27 LAB — CBC WITH DIFFERENTIAL/PLATELET
Basophils Absolute: 0 10*3/uL (ref 0–0.1)
Basophils Relative: 2 %
EOS ABS: 0 10*3/uL (ref 0–0.7)
EOS PCT: 1 %
HCT: 28.4 % — ABNORMAL LOW (ref 35.0–47.0)
Hemoglobin: 10 g/dL — ABNORMAL LOW (ref 12.0–16.0)
LYMPHS ABS: 0.9 10*3/uL — AB (ref 1.0–3.6)
Lymphocytes Relative: 28 %
MCH: 38.1 pg — AB (ref 26.0–34.0)
MCHC: 35.2 g/dL (ref 32.0–36.0)
MCV: 108.4 fL — ABNORMAL HIGH (ref 80.0–100.0)
MONO ABS: 0.2 10*3/uL (ref 0.2–0.9)
MONOS PCT: 6 %
Neutro Abs: 2 10*3/uL (ref 1.4–6.5)
Neutrophils Relative %: 63 %
PLATELETS: 233 10*3/uL (ref 150–440)
RBC: 2.62 MIL/uL — ABNORMAL LOW (ref 3.80–5.20)
RDW: 14 % (ref 11.5–14.5)
WBC: 3.1 10*3/uL — ABNORMAL LOW (ref 3.6–11.0)

## 2015-08-27 NOTE — Progress Notes (Signed)
[place] OFFICE PROGRESS NOTE  Patient Care Team: [name], [name]   SUMMARY OF ONCOLOGIC HISTORY:  # OCT 2015-STAGE IV LEFT BREAST T2N1 [T=4cm; [name]; [name]; [name]; EBUS- Positive Paratrac/subcarinal LN s/p ? Taxotere [in [place]; [name]; [name];[name]; [name]; suv 3]  # ? Bony lesions- PET sep 2016-non-hypermetabolic sclerotic lesions T10; Ant R iliac bone; inferior sternum- not on X-geva  # Pancreatitis Hx/ CKD [creat ~ 2-3]  INTERVAL HISTORY:  [name] with above history of metastatic left breast cancer currently on second line therapy with ibrance plus Femara is here for follow-up.   Patient's appetite is good. No shortness of breath or cough. No weight loss. Denies any diarrhea.  She does admit to decreased by mouth intake; cannot particularly well controlled blood sugars.   REVIEW OF SYSTEMS:  A complete 10 point review of system is done which is negative except mentioned above/history of present illness.   PAST MEDICAL HISTORY :  Past Medical History  Diagnosis Date  . History of left breast cancer 05/29/14  . Diabetes mellitus, type 2 ([place])   . Asthma   . Obesity   . Stroke [facility]) 2010    with mild left arm weakness  . Pancreatitis 1997  . CHF (congestive heart failure) ([place]) 1997  . CKD (chronic kidney disease)   . Depression   . Hair loss     PAST SURGICAL HISTORY :   Past Surgical History  Procedure Laterality Date  . Cesarean section    . Cholecystectomy      FAMILY HISTORY :   Family History  Problem Relation Age of Onset  . Diabetes    . Hypertension      SOCIAL HISTORY:   Social History  Substance Use Topics  . Smoking status: Former Smoker -- 0.50 packs/day for 1 years    Types: Cigarettes  . Smokeless  tobacco: Never Used  . Alcohol Use: No    ALLERGIES:  has No Known Allergies.  MEDICATIONS:  Current Outpatient Prescriptions  Medication Sig Dispense Refill  . acetaminophen-codeine (TYLENOL #4) 300-60 MG tablet     . albuterol (PROAIR HFA) 108 (90 BASE) MCG/ACT inhaler Inhale into the lungs.    Marland Kitchen albuterol (PROVENTIL) (2.5 MG/3ML) 0.083% nebulizer solution Inhale into the lungs.    . ALPRAZolam (XANAX) 0.5 MG tablet Take by mouth.    Marland Kitchen aspirin EC 81 MG tablet Take by mouth.    Marland Kitchen atenolol (TENORMIN) 50 MG tablet TAKE 1 1/2 TABLETS BY MOUTH TWICE DAILY    . B-D ULTRA-FINE 33 LANCETS MISC Use 1 each 2 (two) times daily.    . B-D ULTRAFINE III SHORT PEN 31G X 8 MM MISC     . bumetanide (BUMEX) 0.5 MG tablet TAKE 1 TABLET BY MOUTH TWICE DAILY    . Cinnamon 500 MG capsule Take by mouth.    . cloNIDine (CATAPRES) 0.2 MG tablet Take by mouth.    Marland Kitchen FLUoxetine (PROZAC) 20 MG capsule Take by mouth.    Marland Kitchen glucose blood (ONE TOUCH ULTRA TEST) test strip Use 1 each 2 (two) times daily. Use as instructed.    . glyBURIDE (DIABETA) 5 MG tablet Take by mouth.    . letrozole (FEMARA) 2.5 MG tablet     . LEVEMIR FLEXTOUCH 100 UNIT/ML Pen     .  metFORMIN (GLUCOPHAGE) 500 MG tablet TAKE 2 TABLETS BY MOUTH TWICE DAILY    . palbociclib (IBRANCE) 125 MG capsule Take 125 mg by mouth daily with breakfast. Take whole with food., 1 pill daily for 3 weeks and 1 week off    . salmeterol (SEREVENT) 50 MCG/DOSE diskus inhaler Inhale 1 puff into the lungs 2 (two) times daily.    . simvastatin (ZOCOR) 20 MG tablet Take by mouth.    Marland Kitchen amLODipine (NORVASC) 10 MG tablet Take by mouth.    . enalapril (VASOTEC) 20 MG tablet      No current facility-administered medications for this visit.    PHYSICAL EXAMINATION: ECOG PERFORMANCE STATUS: 0 - Asymptomatic  BP 146/94 mmHg  Pulse 72  Temp(Src) 95.2 F (35.1 C)  Ht '5\' 1"'$  (1.549 m)  Wt 183 lb 6.8 oz (83.2 kg)  BMI 34.68 kg/m2  Filed Weights   08/27/15 1030   Weight: 183 lb 6.8 oz (83.2 kg)    GENERAL: Well-nourished well-developed; Alert, no distress and comfortable.   Accompanied by her son/grandson. EYES: no pallor or icterus OROPHARYNX: no thrush or ulceration; good dentition  NECK: supple, no masses felt LYMPH:  no palpable lymphadenopathy in the cervical, axillary or inguinal regions LUNGS: clear to auscultation and  No wheeze or crackles HEART/CVS: regular rate & rhythm and no murmurs; No lower extremity edema ABDOMEN:abdomen soft, non-tender and normal bowel sounds Musculoskeletal:no cyanosis of digits and no clubbing  PSYCH: alert & oriented x 3 with fluent speech NEURO: no focal motor/sensory deficits SKIN:  no rashes or significant lesions LEFT BREAST exam [in the presence of nurse]- dimpling of the skin noted around 3:00 position. The cords or  mass noted; mobile [feels softer as per patient]   LABORATORY DATA:  I have reviewed the data as listed    Component Value Date/Time   NA 130* 08/27/2015 1005   NA 130* 06/06/2014 1102   K 4.3 08/27/2015 1005   K 3.9 06/06/2014 1102   CL 98* 08/27/2015 1005   CL 95* 06/06/2014 1102   CO2 23 08/27/2015 1005   CO2 28 06/06/2014 1102   GLUCOSE 264* 08/27/2015 1005   GLUCOSE 349* 06/06/2014 1102   BUN 42* 08/27/2015 1005   BUN 17 06/06/2014 1102   CREATININE 3.64* 08/27/2015 1005   CREATININE 1.63* 06/06/2014 1102   CALCIUM 8.5* 08/27/2015 1005   CALCIUM 9.2 06/06/2014 1102   PROT 7.5 08/27/2015 1005   PROT 8.2 06/06/2014 1102   ALBUMIN 4.1 08/27/2015 1005   ALBUMIN 3.3* 06/06/2014 1102   AST 13* 08/27/2015 1005   AST 7* 06/06/2014 1102   ALT 11* 08/27/2015 1005   ALT 12* 06/06/2014 1102   ALKPHOS 42 08/27/2015 1005   ALKPHOS 74 06/06/2014 1102   BILITOT 0.6 08/27/2015 1005   BILITOT 0.4 06/06/2014 1102   GFRNONAA 13* 08/27/2015 1005   GFRNONAA 35* 06/06/2014 1102   GFRAA 15* 08/27/2015 1005   GFRAA 42* 06/06/2014 1102    No results found for: SPEP, UPEP  Lab  Results  Component Value Date   WBC 3.1* 08/27/2015   NEUTROABS 2.0 08/27/2015   HGB 10.0* 08/27/2015   HCT 28.4* 08/27/2015   MCV 108.4* 08/27/2015   PLT 233 08/27/2015      Chemistry      Component Value Date/Time   NA 130* 08/27/2015 1005   NA 130* 06/06/2014 1102   K 4.3 08/27/2015 1005   K 3.9 06/06/2014 1102   CL 98* 08/27/2015 1005  CL 95* 06/06/2014 1102   CO2 23 08/27/2015 1005   CO2 28 06/06/2014 1102   BUN 42* 08/27/2015 1005   BUN 17 06/06/2014 1102   CREATININE 3.64* 08/27/2015 1005   CREATININE 1.63* 06/06/2014 1102      Component Value Date/Time   CALCIUM 8.5* 08/27/2015 1005   CALCIUM 9.2 06/06/2014 1102   ALKPHOS 42 08/27/2015 1005   ALKPHOS 74 06/06/2014 1102   AST 13* 08/27/2015 1005   AST 7* 06/06/2014 1102   ALT 11* 08/27/2015 1005   ALT 12* 06/06/2014 1102   BILITOT 0.6 08/27/2015 1005   BILITOT 0.4 06/06/2014 1102       RADIOGRAPHIC STUDIES: I have personally reviewed the radiological images as listed and agreed with the findings in the report. No results found.   ASSESSMENT & PLAN:  # Left breast cancer stage IV/oligometastatic to the mediastinal lymph nodes currently on second line therapy with ibrance and Femara since March 2016. clinically  NO evidence of progression.   # I would recommend restaging PET scan [patient has kidney dysfunction-see discussion below]. Patient's last PET scan was in September in [place] [[place]]  # Patient will continue Femara with ibrance. She starts new cycle next week. Reviewed the blood counts no neutropenia/mild anemia. Platelets normal.  # CKD- worse; creatinine has jumped to 3.67/likely secondary to poorly controlled blood sugars/history of hypertension. I would recommend evaluation with nephrology. Patient agrees.  # We'll plan to get a CBC CMP prior to each cycle of treatment. Patient will get a PET scan in 4 weeks; follow up with me in 5 weeks.  25 minutes face-to-face spent with the patient has been  regarding the above medical care/prognosis; with more than 50% of time spent on counseling and coordination of care.     Cammie Sickle, MD 08/27/2015 10:58 AM

## 2015-08-30 ENCOUNTER — Other Ambulatory Visit: Payer: Self-pay | Admitting: Internal Medicine

## 2015-08-30 ENCOUNTER — Telehealth: Payer: Self-pay | Admitting: *Deleted

## 2015-08-30 DIAGNOSIS — C50412 Malignant neoplasm of upper-outer quadrant of left female breast: Secondary | ICD-10-CM

## 2015-08-30 MED ORDER — PALBOCICLIB 125 MG PO CAPS: 125.0000 mg | ORAL_CAPSULE | Freq: Every day | ORAL | Status: DC

## 2015-08-30 NOTE — Telephone Encounter (Signed)
Needs RF on Ibrance.

## 2015-08-30 NOTE — Telephone Encounter (Signed)
New RX for [employer] sent to [facility] via fax.

## 2015-09-10 DIAGNOSIS — I129 Hypertensive chronic kidney disease with stage 1 through stage 4 chronic kidney disease, or unspecified chronic kidney disease: Secondary | ICD-10-CM | POA: Diagnosis not present

## 2015-09-10 DIAGNOSIS — R809 Proteinuria, unspecified: Secondary | ICD-10-CM | POA: Diagnosis not present

## 2015-09-10 DIAGNOSIS — N184 Chronic kidney disease, stage 4 (severe): Secondary | ICD-10-CM | POA: Diagnosis not present

## 2015-09-10 DIAGNOSIS — E1122 Type 2 diabetes mellitus with diabetic chronic kidney disease: Secondary | ICD-10-CM | POA: Diagnosis not present

## 2015-09-14 ENCOUNTER — Other Ambulatory Visit: Payer: Self-pay | Admitting: Nephrology

## 2015-09-14 DIAGNOSIS — N184 Chronic kidney disease, stage 4 (severe): Secondary | ICD-10-CM

## 2015-09-18 ENCOUNTER — Ambulatory Visit
Admission: RE | Admit: 2015-09-18 | Discharge: 2015-09-18 | Disposition: A | Discharge status: Home / Self Care | Payer: Medicare Other | Source: Ambulatory Visit | Attending: Nephrology | Admitting: Nephrology

## 2015-09-18 DIAGNOSIS — N184 Chronic kidney disease, stage 4 (severe): Secondary | ICD-10-CM | POA: Diagnosis not present

## 2015-09-27 ENCOUNTER — Other Ambulatory Visit: Payer: Self-pay | Admitting: Internal Medicine

## 2015-09-27 ENCOUNTER — Ambulatory Visit
Admission: RE | Admit: 2015-09-27 | Discharge: 2015-09-27 | Disposition: A | Discharge status: Home / Self Care | Payer: Medicare Other | Source: Ambulatory Visit | Attending: Internal Medicine | Admitting: Internal Medicine

## 2015-09-27 DIAGNOSIS — C801 Malignant (primary) neoplasm, unspecified: Secondary | ICD-10-CM | POA: Diagnosis not present

## 2015-09-27 DIAGNOSIS — C50412 Malignant neoplasm of upper-outer quadrant of left female breast: Secondary | ICD-10-CM | POA: Diagnosis not present

## 2015-09-27 DIAGNOSIS — I517 Cardiomegaly: Secondary | ICD-10-CM | POA: Diagnosis not present

## 2015-09-27 DIAGNOSIS — R748 Abnormal levels of other serum enzymes: Secondary | ICD-10-CM | POA: Diagnosis not present

## 2015-09-27 DIAGNOSIS — I7090 Unspecified atherosclerosis: Secondary | ICD-10-CM | POA: Insufficient documentation

## 2015-09-27 DIAGNOSIS — M899 Disorder of bone, unspecified: Secondary | ICD-10-CM | POA: Diagnosis not present

## 2015-09-27 DIAGNOSIS — R7989 Other specified abnormal findings of blood chemistry: Secondary | ICD-10-CM

## 2015-09-27 DIAGNOSIS — C7981 Secondary malignant neoplasm of breast: Secondary | ICD-10-CM | POA: Diagnosis not present

## 2015-09-27 LAB — GLUCOSE, CAPILLARY: GLUCOSE-CAPILLARY: 175 mg/dL — AB (ref 65–99)

## 2015-09-27 MED ORDER — TECHNETIUM TC 99M SESTAMIBI - CARDIOLITE
12.7900 | Freq: Once | INTRAVENOUS | Status: AC | PRN
  Administered 2015-09-27: 12.79 via INTRAVENOUS

## 2015-10-01 ENCOUNTER — Inpatient Hospital Stay: Payer: Medicare Other

## 2015-10-01 ENCOUNTER — Inpatient Hospital Stay: Payer: Medicare Other | Attending: Internal Medicine | Admitting: Internal Medicine

## 2015-10-01 ENCOUNTER — Encounter: Payer: Self-pay | Admitting: Internal Medicine

## 2015-10-01 VITALS — BP 179/110 | HR 75 | Temp 97.4°F | Resp 18 | Ht 61.0 in | Wt 183.2 lb

## 2015-10-01 DIAGNOSIS — N184 Chronic kidney disease, stage 4 (severe): Secondary | ICD-10-CM | POA: Diagnosis not present

## 2015-10-01 DIAGNOSIS — J45909 Unspecified asthma, uncomplicated: Secondary | ICD-10-CM | POA: Diagnosis not present

## 2015-10-01 DIAGNOSIS — I129 Hypertensive chronic kidney disease with stage 1 through stage 4 chronic kidney disease, or unspecified chronic kidney disease: Secondary | ICD-10-CM | POA: Diagnosis not present

## 2015-10-01 DIAGNOSIS — Z79811 Long term (current) use of aromatase inhibitors: Secondary | ICD-10-CM | POA: Insufficient documentation

## 2015-10-01 DIAGNOSIS — Z794 Long term (current) use of insulin: Secondary | ICD-10-CM | POA: Diagnosis not present

## 2015-10-01 DIAGNOSIS — N2581 Secondary hyperparathyroidism of renal origin: Secondary | ICD-10-CM | POA: Diagnosis not present

## 2015-10-01 DIAGNOSIS — Z95828 Presence of other vascular implants and grafts: Secondary | ICD-10-CM

## 2015-10-01 DIAGNOSIS — Z87891 Personal history of nicotine dependence: Secondary | ICD-10-CM | POA: Insufficient documentation

## 2015-10-01 DIAGNOSIS — C778 Secondary and unspecified malignant neoplasm of lymph nodes of multiple regions: Secondary | ICD-10-CM | POA: Insufficient documentation

## 2015-10-01 DIAGNOSIS — E1122 Type 2 diabetes mellitus with diabetic chronic kidney disease: Secondary | ICD-10-CM | POA: Insufficient documentation

## 2015-10-01 DIAGNOSIS — Z8719 Personal history of other diseases of the digestive system: Secondary | ICD-10-CM | POA: Insufficient documentation

## 2015-10-01 DIAGNOSIS — N189 Chronic kidney disease, unspecified: Secondary | ICD-10-CM | POA: Insufficient documentation

## 2015-10-01 DIAGNOSIS — Z9221 Personal history of antineoplastic chemotherapy: Secondary | ICD-10-CM | POA: Diagnosis not present

## 2015-10-01 DIAGNOSIS — C7951 Secondary malignant neoplasm of bone: Secondary | ICD-10-CM | POA: Insufficient documentation

## 2015-10-01 DIAGNOSIS — C50412 Malignant neoplasm of upper-outer quadrant of left female breast: Secondary | ICD-10-CM

## 2015-10-01 DIAGNOSIS — Z7982 Long term (current) use of aspirin: Secondary | ICD-10-CM | POA: Insufficient documentation

## 2015-10-01 DIAGNOSIS — C50912 Malignant neoplasm of unspecified site of left female breast: Secondary | ICD-10-CM | POA: Diagnosis not present

## 2015-10-01 DIAGNOSIS — Z79899 Other long term (current) drug therapy: Secondary | ICD-10-CM | POA: Insufficient documentation

## 2015-10-01 DIAGNOSIS — D649 Anemia, unspecified: Secondary | ICD-10-CM | POA: Diagnosis not present

## 2015-10-01 DIAGNOSIS — R7989 Other specified abnormal findings of blood chemistry: Secondary | ICD-10-CM

## 2015-10-01 DIAGNOSIS — Z8041 Family history of malignant neoplasm of ovary: Secondary | ICD-10-CM | POA: Diagnosis not present

## 2015-10-01 DIAGNOSIS — F329 Major depressive disorder, single episode, unspecified: Secondary | ICD-10-CM | POA: Insufficient documentation

## 2015-10-01 DIAGNOSIS — D631 Anemia in chronic kidney disease: Secondary | ICD-10-CM | POA: Diagnosis not present

## 2015-10-01 LAB — COMPREHENSIVE METABOLIC PANEL
ALK PHOS: 45 U/L (ref 38–126)
ALT: 15 U/L (ref 14–54)
AST: 16 U/L (ref 15–41)
Albumin: 4 g/dL (ref 3.5–5.0)
Anion gap: 8 (ref 5–15)
BUN: 35 mg/dL — ABNORMAL HIGH (ref 6–20)
CALCIUM: 8.5 mg/dL — AB (ref 8.9–10.3)
CO2: 22 mmol/L (ref 22–32)
CREATININE: 2.86 mg/dL — AB (ref 0.44–1.00)
Chloride: 99 mmol/L — ABNORMAL LOW (ref 101–111)
GFR, EST AFRICAN AMERICAN: 20 mL/min — AB (ref >60)
GFR, EST NON AFRICAN AMERICAN: 17 mL/min — AB (ref >60)
Glucose, Bld: 370 mg/dL — ABNORMAL HIGH (ref 65–99)
Potassium: 4.1 mmol/L (ref 3.5–5.1)
SODIUM: 129 mmol/L — AB (ref 135–145)
Total Bilirubin: 0.5 mg/dL (ref 0.3–1.2)
Total Protein: 7.2 g/dL (ref 6.5–8.1)

## 2015-10-01 LAB — CBC WITH DIFFERENTIAL/PLATELET
Basophils Absolute: 0.1 10*3/uL (ref 0–0.1)
Basophils Relative: 4 %
EOS ABS: 0 10*3/uL (ref 0–0.7)
EOS PCT: 1 %
HCT: 25.3 % — ABNORMAL LOW (ref 35.0–47.0)
HEMOGLOBIN: 9.1 g/dL — AB (ref 12.0–16.0)
LYMPHS ABS: 0.9 10*3/uL — AB (ref 1.0–3.6)
LYMPHS PCT: 29 %
MCH: 38.1 pg — AB (ref 26.0–34.0)
MCHC: 35.9 g/dL (ref 32.0–36.0)
MCV: 106 fL — AB (ref 80.0–100.0)
MONOS PCT: 9 %
Monocytes Absolute: 0.3 10*3/uL (ref 0.2–0.9)
Neutro Abs: 1.8 10*3/uL (ref 1.4–6.5)
Neutrophils Relative %: 59 %
PLATELETS: 178 10*3/uL (ref 150–440)
RBC: 2.39 MIL/uL — AB (ref 3.80–5.20)
RDW: 13.6 % (ref 11.5–14.5)
WBC: 3.1 10*3/uL — AB (ref 3.6–11.0)

## 2015-10-01 MED ORDER — SODIUM CHLORIDE 0.9% FLUSH
10.0000 mL | INTRAVENOUS | Status: DC | PRN
  Administered 2015-10-01: 10 mL via INTRAVENOUS
  Filled 2015-10-01: qty 10

## 2015-10-01 MED ORDER — HEPARIN SOD (PORK) LOCK FLUSH 100 UNIT/ML IV SOLN
500.0000 [IU] | Freq: Once | INTRAVENOUS | Status: AC
  Administered 2015-10-01: 500 [IU] via INTRAVENOUS

## 2015-10-01 NOTE — Progress Notes (Signed)
Pt tolerating ibrance and letrozole without any concerns.  She is on her week off of the ibrance and she is expecting a new shipment today or tom. Eating well, drinking good. Bowels are good. No c/o. Pt b/p up 179/110 pt has been running around helping a friend here today and she is also nervous about her scan results today.  Recheck 15 min later 179/111

## 2015-10-01 NOTE — Progress Notes (Signed)
[place] OFFICE PROGRESS NOTE  Patient Care Team: [name], [name]   SUMMARY OF ONCOLOGIC HISTORY:  # OCT 2015-STAGE IV LEFT BREAST T2N1 [T=4cm; [name]; [name]; [name]; EBUS- Positive Paratrac/subcarinal LN s/p ? Taxotere [in [place]; [name]; [name];[name]; [name]; suv 3]; [name]; [name]  # ? Bony lesions- PET sep 2016-non-hypermetabolic sclerotic lesions T10; Ant R iliac bone; inferior sternum- not on X-geva  # Pancreatitis Hx/ CKD [creat ~ 2-3]  INTERVAL HISTORY:  [name] with above history of metastatic left breast cancer currently on second line therapy with ibrance plus Femara is here for follow-up.   Denies any unusual shortness of breath or cough. Her appetite is good.  Patient's appetite is good.  No weight loss. Denies any diarrhea.   REVIEW OF SYSTEMS:  A complete 10 point review of system is done which is negative except mentioned above/history of present illness.   PAST MEDICAL HISTORY :  Past Medical History  Diagnosis Date  . History of left breast cancer 05/29/14  . Diabetes mellitus, type 2 ([place])   . Asthma   . Obesity   . Stroke [facility]) 2010    with mild left arm weakness  . Pancreatitis 1997  . CHF (congestive heart failure) ([place]) 1997  . CKD (chronic kidney disease)   . Depression   . Hair loss     PAST SURGICAL HISTORY :   Past Surgical History  Procedure Laterality Date  . Cesarean section    . Cholecystectomy      FAMILY HISTORY :   Family History  Problem Relation Age of Onset  . Ovarian cancer Mother   . Diabetes Mother   . Hypertension Mother   . COPD Father   . Hypertension Father   . Diabetes Sister   . Diabetes  Brother     SOCIAL HISTORY:   Social History  Substance Use Topics  . Smoking status: Former Smoker -- 0.50 packs/day for 1 years    Types: Cigarettes  . Smokeless tobacco: Never Used  . Alcohol Use: No    ALLERGIES:  has No Known Allergies.  MEDICATIONS:  Current Outpatient Prescriptions  Medication Sig Dispense Refill  . acetaminophen-codeine (TYLENOL #4) 300-60 MG tablet     . albuterol (PROAIR HFA) 108 (90 BASE) MCG/ACT inhaler Inhale into the lungs.    Marland Kitchen albuterol (PROVENTIL) (2.5 MG/3ML) 0.083% nebulizer solution Inhale into the lungs.    . ALPRAZolam (XANAX) 0.5 MG tablet Take by mouth.    Marland Kitchen amLODipine (NORVASC) 10 MG tablet Take by mouth.    Marland Kitchen aspirin EC 81 MG tablet Take by mouth.    Marland Kitchen atenolol (TENORMIN) 50 MG tablet TAKE 1 1/2 TABLETS BY MOUTH TWICE DAILY    . B-D ULTRA-FINE 33 LANCETS MISC Use 1 each 2 (two) times daily.    . B-D ULTRAFINE III SHORT PEN 31G X 8 MM MISC     . bumetanide (BUMEX) 0.5 MG tablet TAKE 1 TABLET BY MOUTH TWICE DAILY    . Cinnamon 500 MG capsule Take by mouth.    . cloNIDine (CATAPRES) 0.2 MG tablet Take by mouth.    . enalapril (VASOTEC) 20 MG tablet     . FLUoxetine (PROZAC) 20 MG capsule  Take by mouth.    . glyBURIDE (DIABETA) 5 MG tablet Take by mouth.    . letrozole (FEMARA) 2.5 MG tablet     . LEVEMIR FLEXTOUCH 100 UNIT/ML Pen Inject 22 Units into the skin daily.     . palbociclib (IBRANCE) 125 MG capsule Take 1 capsule (125 mg total) by mouth daily with breakfast. Take whole with food., 1 pill daily for 3 weeks and 1 week off 21 capsule 6  . salmeterol (SEREVENT) 50 MCG/DOSE diskus inhaler Inhale 1 puff into the lungs 2 (two) times daily.    . simvastatin (ZOCOR) 20 MG tablet Take by mouth.    Marland Kitchen glucose blood (ONE TOUCH ULTRA TEST) test strip Use 1 each 2 (two) times daily. Use as instructed.     No current facility-administered medications for this visit.    PHYSICAL EXAMINATION: ECOG PERFORMANCE STATUS: 0 - Asymptomatic  BP  179/110 mmHg  Pulse 75  Temp(Src) 97.4 F (36.3 C) (Tympanic)  Resp 18  Ht '5\' 1"'$  (1.549 m)  Wt 183 lb 3.2 oz (83.1 kg)  BMI 34.63 kg/m2  Filed Weights   10/01/15 1047 10/01/15 1102  Weight: 183 lb 3.2 oz (83.1 kg) 183 lb 3.2 oz (83.1 kg)    GENERAL: Well-nourished well-developed; Alert, no distress and comfortable.   Accompanied by her family.  EYES: no pallor or icterus OROPHARYNX: no thrush or ulceration; good dentition  NECK: supple, no masses felt LYMPH:  no palpable lymphadenopathy in the cervical, axillary or inguinal regions LUNGS: clear to auscultation and  No wheeze or crackles HEART/CVS: regular rate & rhythm and no murmurs; No lower extremity edema ABDOMEN:abdomen soft, non-tender and normal bowel sounds Musculoskeletal:no cyanosis of digits and no clubbing  PSYCH: alert & oriented x 3 with fluent speech NEURO: no focal motor/sensory deficits SKIN:  no rashes or significant lesions LEFT BREAST exam [in the presence of nurse]- dimpling of the skin noted around 3:00 position. The cords or  mass noted; mobile [feels softer as per patient]   LABORATORY DATA:  I have reviewed the data as listed    Component Value Date/Time   NA 129* 10/01/2015 0958   NA 130* 06/06/2014 1102   K 4.1 10/01/2015 0958   K 3.9 06/06/2014 1102   CL 99* 10/01/2015 0958   CL 95* 06/06/2014 1102   CO2 22 10/01/2015 0958   CO2 28 06/06/2014 1102   GLUCOSE 370* 10/01/2015 0958   GLUCOSE 349* 06/06/2014 1102   BUN 35* 10/01/2015 0958   BUN 17 06/06/2014 1102   CREATININE 2.86* 10/01/2015 0958   CREATININE 1.63* 06/06/2014 1102   CALCIUM 8.5* 10/01/2015 0958   CALCIUM 9.2 06/06/2014 1102   PROT 7.2 10/01/2015 0958   PROT 8.2 06/06/2014 1102   ALBUMIN 4.0 10/01/2015 0958   ALBUMIN 3.3* 06/06/2014 1102   AST 16 10/01/2015 0958   AST 7* 06/06/2014 1102   ALT 15 10/01/2015 0958   ALT 12* 06/06/2014 1102   ALKPHOS 45 10/01/2015 0958   ALKPHOS 74 06/06/2014 1102   BILITOT 0.5 10/01/2015  0958   BILITOT 0.4 06/06/2014 1102   GFRNONAA 17* 10/01/2015 0958   GFRNONAA 35* 06/06/2014 1102   GFRAA 20* 10/01/2015 0958   GFRAA 42* 06/06/2014 1102    No results found for: SPEP, UPEP  Lab Results  Component Value Date   WBC 3.1* 10/01/2015   NEUTROABS 1.8 10/01/2015   HGB 9.1* 10/01/2015   HCT 25.3* 10/01/2015   MCV 106.0* 10/01/2015  PLT 178 10/01/2015      Chemistry      Component Value Date/Time   NA 129* 10/01/2015 0958   NA 130* 06/06/2014 1102   K 4.1 10/01/2015 0958   K 3.9 06/06/2014 1102   CL 99* 10/01/2015 0958   CL 95* 06/06/2014 1102   CO2 22 10/01/2015 0958   CO2 28 06/06/2014 1102   BUN 35* 10/01/2015 0958   BUN 17 06/06/2014 1102   CREATININE 2.86* 10/01/2015 0958   CREATININE 1.63* 06/06/2014 1102      Component Value Date/Time   CALCIUM 8.5* 10/01/2015 0958   CALCIUM 9.2 06/06/2014 1102   ALKPHOS 45 10/01/2015 0958   ALKPHOS 74 06/06/2014 1102   AST 16 10/01/2015 0958   AST 7* 06/06/2014 1102   ALT 15 10/01/2015 0958   ALT 12* 06/06/2014 1102   BILITOT 0.5 10/01/2015 0958   BILITOT 0.4 06/06/2014 1102       RADIOGRAPHIC STUDIES: I have personally reviewed the radiological images as listed and agreed with the findings in the report. No results found.   ASSESSMENT & PLAN:  # Left breast cancer stage IV/oligometastatic to the mediastinal lymph nodes currently on second line therapy with ibrance and Femara since March 2016. clinically  NO evidence of progression. PET- no progression/improved left breast mass/ stable-treated bone lesions.  # Patient will continue Femara with ibrance. She starts new cycle next week. Reviewed the blood counts no neutropenia/anemia- hemoglobin around 9. Platelets normal.  # CKD- creatinine- 2.8 slightly improved/- likely secondary to poorly controlled blood sugars/history of hypertension. She has been evaluated by nephrology. Appreciate the recommendations.  # Anemia likely from chronic kidney disease/  however Procrit could be offered- with the concern for progression of malignancy will be discussed. For now recommend starting by mouth twice a day; and also  iron studies ferritin iron saturations at next visit.  # blood pressure/ blood sugar- defer to her PCP.  # We'll plan to get a CBC CMP prior to each cycle of treatment/follow-up with me in 4 weeks.  25 minutes face-to-face spent with the patient has been regarding the above medical care/prognosis; with more than 50% of time spent on counseling and coordination of care.     Cammie Sickle, MD 10/01/2015 11:13 AM

## 2015-10-29 ENCOUNTER — Inpatient Hospital Stay: Payer: Medicare Other

## 2015-10-29 ENCOUNTER — Inpatient Hospital Stay: Payer: Medicare Other | Admitting: Internal Medicine

## 2015-11-07 ENCOUNTER — Ambulatory Visit: Payer: Medicare Other | Admitting: Internal Medicine

## 2015-11-07 ENCOUNTER — Other Ambulatory Visit: Payer: Medicare Other

## 2015-11-08 ENCOUNTER — Inpatient Hospital Stay (HOSPITAL_BASED_OUTPATIENT_CLINIC_OR_DEPARTMENT_OTHER): Payer: Medicare Other | Admitting: Internal Medicine

## 2015-11-08 ENCOUNTER — Inpatient Hospital Stay: Payer: Medicare Other | Attending: Internal Medicine

## 2015-11-08 ENCOUNTER — Inpatient Hospital Stay: Payer: Medicare Other

## 2015-11-08 VITALS — BP 138/90 | HR 79 | Temp 97.7°F | Ht 64.8 in | Wt 185.5 lb

## 2015-11-08 DIAGNOSIS — Z7982 Long term (current) use of aspirin: Secondary | ICD-10-CM | POA: Insufficient documentation

## 2015-11-08 DIAGNOSIS — C7951 Secondary malignant neoplasm of bone: Secondary | ICD-10-CM | POA: Diagnosis not present

## 2015-11-08 DIAGNOSIS — Z452 Encounter for adjustment and management of vascular access device: Secondary | ICD-10-CM | POA: Insufficient documentation

## 2015-11-08 DIAGNOSIS — I129 Hypertensive chronic kidney disease with stage 1 through stage 4 chronic kidney disease, or unspecified chronic kidney disease: Secondary | ICD-10-CM

## 2015-11-08 DIAGNOSIS — D649 Anemia, unspecified: Secondary | ICD-10-CM | POA: Diagnosis not present

## 2015-11-08 DIAGNOSIS — Z87891 Personal history of nicotine dependence: Secondary | ICD-10-CM

## 2015-11-08 DIAGNOSIS — I509 Heart failure, unspecified: Secondary | ICD-10-CM | POA: Diagnosis not present

## 2015-11-08 DIAGNOSIS — Z79899 Other long term (current) drug therapy: Secondary | ICD-10-CM | POA: Insufficient documentation

## 2015-11-08 DIAGNOSIS — I69354 Hemiplegia and hemiparesis following cerebral infarction affecting left non-dominant side: Secondary | ICD-10-CM | POA: Insufficient documentation

## 2015-11-08 DIAGNOSIS — Z170 Estrogen receptor positive status [ER+]: Secondary | ICD-10-CM

## 2015-11-08 DIAGNOSIS — Z9221 Personal history of antineoplastic chemotherapy: Secondary | ICD-10-CM

## 2015-11-08 DIAGNOSIS — E1122 Type 2 diabetes mellitus with diabetic chronic kidney disease: Secondary | ICD-10-CM

## 2015-11-08 DIAGNOSIS — N189 Chronic kidney disease, unspecified: Secondary | ICD-10-CM | POA: Diagnosis not present

## 2015-11-08 DIAGNOSIS — C50912 Malignant neoplasm of unspecified site of left female breast: Secondary | ICD-10-CM | POA: Diagnosis not present

## 2015-11-08 DIAGNOSIS — Z95828 Presence of other vascular implants and grafts: Secondary | ICD-10-CM

## 2015-11-08 DIAGNOSIS — Z79811 Long term (current) use of aromatase inhibitors: Secondary | ICD-10-CM | POA: Diagnosis not present

## 2015-11-08 DIAGNOSIS — C778 Secondary and unspecified malignant neoplasm of lymph nodes of multiple regions: Secondary | ICD-10-CM | POA: Insufficient documentation

## 2015-11-08 DIAGNOSIS — Z794 Long term (current) use of insulin: Secondary | ICD-10-CM | POA: Insufficient documentation

## 2015-11-08 DIAGNOSIS — C50412 Malignant neoplasm of upper-outer quadrant of left female breast: Secondary | ICD-10-CM

## 2015-11-08 LAB — COMPREHENSIVE METABOLIC PANEL
ALT: 16 U/L (ref 14–54)
ANION GAP: 6 (ref 5–15)
AST: 21 U/L (ref 15–41)
Albumin: 4 g/dL (ref 3.5–5.0)
Alkaline Phosphatase: 58 U/L (ref 38–126)
BUN: 41 mg/dL — ABNORMAL HIGH (ref 6–20)
CHLORIDE: 95 mmol/L — AB (ref 101–111)
CO2: 23 mmol/L (ref 22–32)
Calcium: 8.7 mg/dL — ABNORMAL LOW (ref 8.9–10.3)
Creatinine, Ser: 3.16 mg/dL — ABNORMAL HIGH (ref 0.44–1.00)
GFR calc non Af Amer: 15 mL/min — ABNORMAL LOW (ref >60)
GFR, EST AFRICAN AMERICAN: 18 mL/min — AB (ref >60)
Glucose, Bld: 598 mg/dL (ref 65–99)
POTASSIUM: 4.9 mmol/L (ref 3.5–5.1)
SODIUM: 124 mmol/L — AB (ref 135–145)
Total Bilirubin: 0.4 mg/dL (ref 0.3–1.2)
Total Protein: 7.5 g/dL (ref 6.5–8.1)

## 2015-11-08 LAB — CBC WITH DIFFERENTIAL/PLATELET
Basophils Absolute: 0 10*3/uL (ref 0–0.1)
Basophils Relative: 1 %
EOS ABS: 0 10*3/uL (ref 0–0.7)
Eosinophils Relative: 0 %
HEMATOCRIT: 26.2 % — AB (ref 35.0–47.0)
HEMOGLOBIN: 9.2 g/dL — AB (ref 12.0–16.0)
LYMPHS ABS: 0.4 10*3/uL — AB (ref 1.0–3.6)
Lymphocytes Relative: 10 %
MCH: 37.2 pg — AB (ref 26.0–34.0)
MCHC: 35 g/dL (ref 32.0–36.0)
MCV: 106.3 fL — ABNORMAL HIGH (ref 80.0–100.0)
MONO ABS: 0.3 10*3/uL (ref 0.2–0.9)
MONOS PCT: 8 %
NEUTROS ABS: 3.5 10*3/uL (ref 1.4–6.5)
NEUTROS PCT: 81 %
Platelets: 131 10*3/uL — ABNORMAL LOW (ref 150–440)
RBC: 2.46 MIL/uL — ABNORMAL LOW (ref 3.80–5.20)
RDW: 13 % (ref 11.5–14.5)
WBC: 4.3 10*3/uL (ref 3.6–11.0)

## 2015-11-08 LAB — FERRITIN: FERRITIN: 551 ng/mL — AB (ref 11–307)

## 2015-11-08 LAB — IRON AND TIBC
IRON: 22 ug/dL — AB (ref 28–170)
Saturation Ratios: 7 % — ABNORMAL LOW (ref 10.4–31.8)
TIBC: 314 ug/dL (ref 250–450)
UIBC: 292 ug/dL

## 2015-11-08 LAB — LACTATE DEHYDROGENASE: LDH: 155 U/L (ref 98–192)

## 2015-11-08 MED ORDER — SODIUM CHLORIDE 0.9% FLUSH
10.0000 mL | INTRAVENOUS | Status: DC | PRN
  Administered 2015-11-08: 10 mL via INTRAVENOUS
  Filled 2015-11-08: qty 10

## 2015-11-08 MED ORDER — HEPARIN SOD (PORK) LOCK FLUSH 100 UNIT/ML IV SOLN
500.0000 [IU] | Freq: Once | INTRAVENOUS | Status: AC
  Administered 2015-11-08: 500 [IU] via INTRAVENOUS

## 2015-11-08 NOTE — Progress Notes (Signed)
[place] OFFICE PROGRESS NOTE  Patient Care Team: [name], [name]   SUMMARY OF ONCOLOGIC HISTORY:  # OCT 2015-STAGE IV LEFT BREAST T2N1 [T=4cm; [name]; [name]; [name]; EBUS- Positive Paratrac/subcarinal LN s/p ? Taxotere [in [place]; [name]; [name];[name]; [name]; suv 3]; [name]; [name]  # ? Bony lesions- PET sep 2016-non-hypermetabolic sclerotic lesions T10; Ant R iliac bone; inferior sternum- not on X-geva  # Pancreatitis Hx/ CKD [creat ~ 2-3]  INTERVAL HISTORY:  [name] with above history of metastatic left breast cancer currently on second line therapy with ibrance plus Femara is here for follow-up.  Patient started ibrance on April 2nd.   Patient denies any unusual fatigue. Denies any dizziness. Denies any nausea vomiting. Denies any unusual shortness of breath or cough. Her appetite is good. No weight loss. Denies any diarrhea.  REVIEW OF SYSTEMS:  A complete 10 point review of system is done which is negative except mentioned above/history of present illness.   PAST MEDICAL HISTORY :  Past Medical History  Diagnosis Date  . History of left breast cancer 05/29/14  . Diabetes mellitus, type 2 ([place])   . Asthma   . Obesity   . Stroke [facility]) 2010    with mild left arm weakness  . Pancreatitis 1997  . CHF (congestive heart failure) ([place]) 1997  . CKD (chronic kidney disease)   . Depression   . Hair loss     PAST SURGICAL HISTORY :   Past Surgical History  Procedure Laterality Date  . Cesarean section    . Cholecystectomy      FAMILY HISTORY :   Family History  Problem Relation Age of Onset  . Ovarian cancer Mother   . Diabetes Mother   .  Hypertension Mother   . COPD Father   . Hypertension Father   . Diabetes Sister   . Diabetes Brother     SOCIAL HISTORY:   Social History  Substance Use Topics  . Smoking status: Former Smoker -- 0.50 packs/day for 1 years    Types: Cigarettes  . Smokeless tobacco: Never Used  . Alcohol Use: No    ALLERGIES:  has No Known Allergies.  MEDICATIONS:  Current Outpatient Prescriptions  Medication Sig Dispense Refill  . acetaminophen-codeine (TYLENOL #4) 300-60 MG tablet     . albuterol (PROAIR HFA) 108 (90 BASE) MCG/ACT inhaler Inhale into the lungs.    Marland Kitchen albuterol (PROVENTIL) (2.5 MG/3ML) 0.083% nebulizer solution Inhale into the lungs.    . ALPRAZolam (XANAX) 0.5 MG tablet Take by mouth.    Marland Kitchen amLODipine (NORVASC) 10 MG tablet Take by mouth.    Marland Kitchen aspirin EC 81 MG tablet Take by mouth.    Marland Kitchen atenolol (TENORMIN) 50 MG tablet TAKE 1 1/2 TABLETS BY MOUTH TWICE DAILY    . B-D ULTRA-FINE 33 LANCETS MISC Use 1 each 2 (two) times daily.    . B-D ULTRAFINE III SHORT PEN 31G X 8 MM MISC     . bumetanide (BUMEX) 0.5 MG tablet TAKE 1 TABLET BY MOUTH TWICE DAILY    . Cinnamon 500 MG capsule Take by mouth.    . cloNIDine (CATAPRES) 0.2 MG tablet Take by mouth.    . enalapril (VASOTEC) 20  MG tablet     . FLUoxetine (PROZAC) 20 MG capsule Take by mouth.    Marland Kitchen glucose blood (ONE TOUCH ULTRA TEST) test strip Use 1 each 2 (two) times daily. Use as instructed.    . glyBURIDE (DIABETA) 5 MG tablet Take by mouth.    . letrozole (FEMARA) 2.5 MG tablet     . LEVEMIR FLEXTOUCH 100 UNIT/ML Pen Inject 22 Units into the skin daily.     . salmeterol (SEREVENT) 50 MCG/DOSE diskus inhaler Inhale 1 puff into the lungs 2 (two) times daily.    . simvastatin (ZOCOR) 20 MG tablet Take by mouth.    . palbociclib (IBRANCE) 125 MG capsule Take 1 capsule (125 mg total) by mouth daily with breakfast. Take whole with food., 1 pill daily for 3 weeks and 1 week off 21 capsule 6   No current facility-administered  medications for this visit.    PHYSICAL EXAMINATION: ECOG PERFORMANCE STATUS: 0 - Asymptomatic  BP 138/90 mmHg  Pulse 79  Temp(Src) 97.7 F (36.5 C) (Tympanic)  Ht 5' 4.8" (1.646 m)  Wt 185 lb 8.3 oz (84.15 kg)  BMI 31.06 kg/m2  Filed Weights   11/08/15 1027  Weight: 185 lb 8.3 oz (84.15 kg)    GENERAL: Well-nourished well-developed; Alert, no distress and comfortable.  She is alone. EYES: no pallor or icterus OROPHARYNX: no thrush or ulceration; good dentition  NECK: supple, no masses felt LYMPH:  no palpable lymphadenopathy in the cervical, axillary or inguinal regions LUNGS: clear to auscultation and  No wheeze or crackles HEART/CVS: regular rate & rhythm and no murmurs; No lower extremity edema ABDOMEN:abdomen soft, non-tender and normal bowel sounds Musculoskeletal:no cyanosis of digits and no clubbing  PSYCH: alert & oriented x 3 with fluent speech NEURO: no focal motor/sensory deficits SKIN:  no rashes or significant lesions    LABORATORY DATA:  I have reviewed the data as listed    Component Value Date/Time   NA 124* 11/08/2015 0952   NA 130* 06/06/2014 1102   K 4.9 11/08/2015 0952   K 3.9 06/06/2014 1102   CL 95* 11/08/2015 0952   CL 95* 06/06/2014 1102   CO2 23 11/08/2015 0952   CO2 28 06/06/2014 1102   GLUCOSE 598* 11/08/2015 0952   GLUCOSE 349* 06/06/2014 1102   BUN 41* 11/08/2015 0952   BUN 17 06/06/2014 1102   CREATININE 3.16* 11/08/2015 0952   CREATININE 1.63* 06/06/2014 1102   CALCIUM 8.7* 11/08/2015 0952   CALCIUM 9.2 06/06/2014 1102   PROT 7.5 11/08/2015 0952   PROT 8.2 06/06/2014 1102   ALBUMIN 4.0 11/08/2015 0952   ALBUMIN 3.3* 06/06/2014 1102   AST 21 11/08/2015 0952   AST 7* 06/06/2014 1102   ALT 16 11/08/2015 0952   ALT 12* 06/06/2014 1102   ALKPHOS 58 11/08/2015 0952   ALKPHOS 74 06/06/2014 1102   BILITOT 0.4 11/08/2015 0952   BILITOT 0.4 06/06/2014 1102   GFRNONAA 15* 11/08/2015 0952   GFRNONAA 35* 06/06/2014 1102   GFRAA  18* 11/08/2015 0952   GFRAA 42* 06/06/2014 1102    No results found for: SPEP, UPEP  Lab Results  Component Value Date   WBC 4.3 11/08/2015   NEUTROABS 3.5 11/08/2015   HGB 9.2* 11/08/2015   HCT 26.2* 11/08/2015   MCV 106.3* 11/08/2015   PLT 131* 11/08/2015      Chemistry      Component Value Date/Time   NA 124* 11/08/2015 0952   NA 130* 06/06/2014 1102  K 4.9 11/08/2015 0952   K 3.9 06/06/2014 1102   CL 95* 11/08/2015 0952   CL 95* 06/06/2014 1102   CO2 23 11/08/2015 0952   CO2 28 06/06/2014 1102   BUN 41* 11/08/2015 0952   BUN 17 06/06/2014 1102   CREATININE 3.16* 11/08/2015 0952   CREATININE 1.63* 06/06/2014 1102      Component Value Date/Time   CALCIUM 8.7* 11/08/2015 0952   CALCIUM 9.2 06/06/2014 1102   ALKPHOS 58 11/08/2015 0952   ALKPHOS 74 06/06/2014 1102   AST 21 11/08/2015 0952   AST 7* 06/06/2014 1102   ALT 16 11/08/2015 0952   ALT 12* 06/06/2014 1102   BILITOT 0.4 11/08/2015 0952   BILITOT 0.4 06/06/2014 1102        ASSESSMENT & PLAN:  # Left breast cancer stage IV/oligometastatic to the mediastinal lymph nodes currently on second line therapy with ibrance and Femara since March 2016. clinically  NO evidence of progression. FEB 2017- PET- no progression/improved left breast mass/ stable-treated bone lesions.  # Patient will continue Femara with ibrance. Patient started [place] on April 2nd.  Reviewed the blood counts no neutropenia/anemia- hemoglobin around 9. Platelets normal.  #  Elevated blood sugars-  598;  Recommend taking  Extra 6 units of Levemir today/ total of 30 units.  Also spoke to [place]-  He recommends evaluation  In his office/  Unlikely patient will need  Mealtime insulin. Patient is asked to call the office now.  # CKD- creatinine- 3.16- likely secondary to poorly controlled blood sugars/history of hypertension. Appreciate nephrology recommendations.  # Anemia likely from chronic kidney disease-  Hemoglobin 9. Fairly  symptomatically. Awaiting on  Iron studies. If low recommend  By mouth iron.  # We'll plan to get a CBC CMP prior to each cycle of treatment/follow-up with me in 4 weeks.     Cammie Sickle, MD 11/08/2015 10:38 AM

## 2015-11-08 NOTE — Patient Instructions (Addendum)
Please contact Dr. Linton Ham office for insulin/glucose management. Call their office today to request an urgent appointment.  Dr. Rogue Bussing spoke with Dr. Ginette Pitman regarding the necessity of this appointment. Your glucose today was 598.  Take 6 more units of Levimir today when you go home.

## 2015-11-08 NOTE — Progress Notes (Signed)
Critical glucose called by Collene Mares in [facility] lab. Glucose 598. Read back process performed with Maudie Mercury in Lab at 1024 am.  Read back process performed with Dr. Rogue Bussing at 1025am.  Pt instructed to take 6 more units of Levemir when pt arrives back home today.  She took 24 units of Levmier this morning.  Teach back process performed with patient.

## 2015-11-08 NOTE — Progress Notes (Signed)
Patient ambulates without assistance, patient denies pain or discomfort.  Vitals documented, medication record updated, information provided by patient.

## 2015-11-09 DIAGNOSIS — C50812 Malignant neoplasm of overlapping sites of left female breast: Secondary | ICD-10-CM | POA: Diagnosis not present

## 2015-11-09 DIAGNOSIS — J4520 Mild intermittent asthma, uncomplicated: Secondary | ICD-10-CM | POA: Diagnosis not present

## 2015-11-09 DIAGNOSIS — I10 Essential (primary) hypertension: Secondary | ICD-10-CM | POA: Diagnosis not present

## 2015-11-09 DIAGNOSIS — E1122 Type 2 diabetes mellitus with diabetic chronic kidney disease: Secondary | ICD-10-CM | POA: Diagnosis not present

## 2015-11-09 DIAGNOSIS — D6489 Other specified anemias: Secondary | ICD-10-CM | POA: Diagnosis not present

## 2015-11-09 DIAGNOSIS — F3289 Other specified depressive episodes: Secondary | ICD-10-CM | POA: Diagnosis not present

## 2015-11-09 DIAGNOSIS — I5022 Chronic systolic (congestive) heart failure: Secondary | ICD-10-CM | POA: Diagnosis not present

## 2015-11-09 DIAGNOSIS — N183 Chronic kidney disease, stage 3 (moderate): Secondary | ICD-10-CM | POA: Diagnosis not present

## 2015-11-09 LAB — SOLUBLE TRANSFERRIN RECEPTOR: TRANSFERRIN RECEPTOR: 24.6 nmol/L (ref 12.2–27.3)

## 2015-11-26 ENCOUNTER — Ambulatory Visit: Payer: Medicare Other | Admitting: Internal Medicine

## 2015-11-26 ENCOUNTER — Other Ambulatory Visit: Payer: Medicare Other

## 2015-12-06 ENCOUNTER — Other Ambulatory Visit: Payer: Medicare Other

## 2015-12-06 ENCOUNTER — Inpatient Hospital Stay: Payer: Medicare Other

## 2015-12-06 ENCOUNTER — Ambulatory Visit: Payer: Medicare Other | Admitting: Internal Medicine

## 2015-12-06 ENCOUNTER — Inpatient Hospital Stay: Payer: Medicare Other | Admitting: Internal Medicine

## 2015-12-12 DIAGNOSIS — N183 Chronic kidney disease, stage 3 (moderate): Secondary | ICD-10-CM | POA: Diagnosis not present

## 2015-12-12 DIAGNOSIS — D6489 Other specified anemias: Secondary | ICD-10-CM | POA: Diagnosis not present

## 2015-12-12 DIAGNOSIS — Z794 Long term (current) use of insulin: Secondary | ICD-10-CM | POA: Diagnosis not present

## 2015-12-12 DIAGNOSIS — F3289 Other specified depressive episodes: Secondary | ICD-10-CM | POA: Diagnosis not present

## 2015-12-12 DIAGNOSIS — I10 Essential (primary) hypertension: Secondary | ICD-10-CM | POA: Diagnosis not present

## 2015-12-12 DIAGNOSIS — C50812 Malignant neoplasm of overlapping sites of left female breast: Secondary | ICD-10-CM | POA: Diagnosis not present

## 2015-12-12 DIAGNOSIS — J4520 Mild intermittent asthma, uncomplicated: Secondary | ICD-10-CM | POA: Diagnosis not present

## 2015-12-12 DIAGNOSIS — E1122 Type 2 diabetes mellitus with diabetic chronic kidney disease: Secondary | ICD-10-CM | POA: Diagnosis not present

## 2015-12-14 ENCOUNTER — Inpatient Hospital Stay (HOSPITAL_BASED_OUTPATIENT_CLINIC_OR_DEPARTMENT_OTHER): Payer: Medicare Other | Admitting: Internal Medicine

## 2015-12-14 ENCOUNTER — Inpatient Hospital Stay: Payer: Medicare Other | Attending: Internal Medicine

## 2015-12-14 ENCOUNTER — Inpatient Hospital Stay: Payer: Medicare Other

## 2015-12-14 VITALS — BP 133/79 | HR 81 | Temp 97.0°F | Resp 18 | Wt 188.9 lb

## 2015-12-14 DIAGNOSIS — Z170 Estrogen receptor positive status [ER+]: Secondary | ICD-10-CM | POA: Diagnosis not present

## 2015-12-14 DIAGNOSIS — Z8041 Family history of malignant neoplasm of ovary: Secondary | ICD-10-CM | POA: Diagnosis not present

## 2015-12-14 DIAGNOSIS — C7951 Secondary malignant neoplasm of bone: Secondary | ICD-10-CM | POA: Diagnosis not present

## 2015-12-14 DIAGNOSIS — E1122 Type 2 diabetes mellitus with diabetic chronic kidney disease: Secondary | ICD-10-CM | POA: Diagnosis not present

## 2015-12-14 DIAGNOSIS — Z79811 Long term (current) use of aromatase inhibitors: Secondary | ICD-10-CM

## 2015-12-14 DIAGNOSIS — D509 Iron deficiency anemia, unspecified: Secondary | ICD-10-CM | POA: Insufficient documentation

## 2015-12-14 DIAGNOSIS — N189 Chronic kidney disease, unspecified: Secondary | ICD-10-CM | POA: Diagnosis not present

## 2015-12-14 DIAGNOSIS — C50912 Malignant neoplasm of unspecified site of left female breast: Secondary | ICD-10-CM | POA: Insufficient documentation

## 2015-12-14 DIAGNOSIS — Z87891 Personal history of nicotine dependence: Secondary | ICD-10-CM | POA: Insufficient documentation

## 2015-12-14 DIAGNOSIS — C778 Secondary and unspecified malignant neoplasm of lymph nodes of multiple regions: Secondary | ICD-10-CM

## 2015-12-14 DIAGNOSIS — D631 Anemia in chronic kidney disease: Secondary | ICD-10-CM | POA: Insufficient documentation

## 2015-12-14 DIAGNOSIS — C50412 Malignant neoplasm of upper-outer quadrant of left female breast: Secondary | ICD-10-CM

## 2015-12-14 DIAGNOSIS — Z79899 Other long term (current) drug therapy: Secondary | ICD-10-CM

## 2015-12-14 DIAGNOSIS — I69354 Hemiplegia and hemiparesis following cerebral infarction affecting left non-dominant side: Secondary | ICD-10-CM

## 2015-12-14 DIAGNOSIS — Z7982 Long term (current) use of aspirin: Secondary | ICD-10-CM

## 2015-12-14 DIAGNOSIS — Z9221 Personal history of antineoplastic chemotherapy: Secondary | ICD-10-CM

## 2015-12-14 DIAGNOSIS — Z794 Long term (current) use of insulin: Secondary | ICD-10-CM

## 2015-12-14 DIAGNOSIS — Z95828 Presence of other vascular implants and grafts: Secondary | ICD-10-CM

## 2015-12-14 LAB — CBC WITH DIFFERENTIAL/PLATELET
Basophils Absolute: 0.1 10*3/uL (ref 0–0.1)
Basophils Relative: 1 %
Eosinophils Absolute: 0 10*3/uL (ref 0–0.7)
Eosinophils Relative: 1 %
HEMATOCRIT: 25.8 % — AB (ref 35.0–47.0)
Hemoglobin: 9.1 g/dL — ABNORMAL LOW (ref 12.0–16.0)
LYMPHS PCT: 14 %
Lymphs Abs: 0.8 10*3/uL — ABNORMAL LOW (ref 1.0–3.6)
MCH: 36.6 pg — ABNORMAL HIGH (ref 26.0–34.0)
MCHC: 35.3 g/dL (ref 32.0–36.0)
MCV: 103.6 fL — AB (ref 80.0–100.0)
MONO ABS: 0.3 10*3/uL (ref 0.2–0.9)
MONOS PCT: 5 %
NEUTROS ABS: 4.6 10*3/uL (ref 1.4–6.5)
Neutrophils Relative %: 79 %
Platelets: 347 10*3/uL (ref 150–440)
RBC: 2.49 MIL/uL — ABNORMAL LOW (ref 3.80–5.20)
RDW: 13 % (ref 11.5–14.5)
WBC: 5.8 10*3/uL (ref 3.6–11.0)

## 2015-12-14 LAB — COMPREHENSIVE METABOLIC PANEL
ALT: 13 U/L — ABNORMAL LOW (ref 14–54)
ANION GAP: 10 (ref 5–15)
AST: 22 U/L (ref 15–41)
Albumin: 3.9 g/dL (ref 3.5–5.0)
Alkaline Phosphatase: 65 U/L (ref 38–126)
BILIRUBIN TOTAL: 0.4 mg/dL (ref 0.3–1.2)
BUN: 47 mg/dL — ABNORMAL HIGH (ref 6–20)
CO2: 22 mmol/L (ref 22–32)
Calcium: 9.2 mg/dL (ref 8.9–10.3)
Chloride: 101 mmol/L (ref 101–111)
Creatinine, Ser: 3.36 mg/dL — ABNORMAL HIGH (ref 0.44–1.00)
GFR, EST AFRICAN AMERICAN: 16 mL/min — AB (ref >60)
GFR, EST NON AFRICAN AMERICAN: 14 mL/min — AB (ref >60)
Glucose, Bld: 402 mg/dL — ABNORMAL HIGH (ref 65–99)
Potassium: 3.9 mmol/L (ref 3.5–5.1)
Sodium: 133 mmol/L — ABNORMAL LOW (ref 135–145)
TOTAL PROTEIN: 7.7 g/dL (ref 6.5–8.1)

## 2015-12-14 MED ORDER — HEPARIN SOD (PORK) LOCK FLUSH 100 UNIT/ML IV SOLN
500.0000 [IU] | Freq: Once | INTRAVENOUS | Status: AC
  Administered 2015-12-14: 500 [IU] via INTRAVENOUS

## 2015-12-14 MED ORDER — SODIUM CHLORIDE 0.9% FLUSH
10.0000 mL | INTRAVENOUS | Status: DC | PRN
  Administered 2015-12-14: 10 mL via INTRAVENOUS
  Filled 2015-12-14: qty 10

## 2015-12-14 NOTE — Progress Notes (Signed)
[place] OFFICE PROGRESS NOTE  Patient Care Team: [name], [name]   SUMMARY OF ONCOLOGIC HISTORY:  # OCT 2015-STAGE IV LEFT BREAST T2N1 [T=4cm; [name]; [name]; [name]; EBUS- Positive Paratrac/subcarinal LN s/p ? Taxotere [in [place]; [name]; [name];[name]; [name]; suv 3]; [name]; [name]  # ? Bony lesions- PET sep 2016-non-hypermetabolic sclerotic lesions T10; Ant R iliac bone; inferior sternum- not on X-geva  # Poorly controlled Blood sugars-  # Pancreatitis Hx/ CKD [creat ~ 2-3]  INTERVAL HISTORY:  [name] with above history of metastatic left breast cancer currently on second line therapy with ibrance plus Femara is here for follow-up.    Her appetite is good. Denies any weight loss. No bone pain. No nausea vomiting diarrhea. No cough or shortness of breath.  Patient continues to complain of elevated blood sugars/ she has been following up with PCP. She also has an appointment with nephrology next week.  REVIEW OF SYSTEMS:  A complete 10 point review of system is done which is negative except mentioned above/history of present illness.   PAST MEDICAL HISTORY :  Past Medical History  Diagnosis Date  . History of left breast cancer 05/29/14  . Diabetes mellitus, type 2 ([place])   . Asthma   . Obesity   . Stroke [facility]) 2010    with mild left arm weakness  . Pancreatitis 1997  . CHF (congestive heart failure) ([place]) 1997  . CKD (chronic kidney disease)   . Depression   . Hair loss     PAST SURGICAL HISTORY :   Past Surgical History  Procedure Laterality Date  . Cesarean section    . Cholecystectomy      FAMILY HISTORY :   Family History  Problem  Relation Age of Onset  . Ovarian cancer Mother   . Diabetes Mother   . Hypertension Mother   . COPD Father   . Hypertension Father   . Diabetes Sister   . Diabetes Brother     SOCIAL HISTORY:   Social History  Substance Use Topics  . Smoking status: Former Smoker -- 0.50 packs/day for 1 years    Types: Cigarettes  . Smokeless tobacco: Never Used  . Alcohol Use: No    ALLERGIES:  has No Known Allergies.  MEDICATIONS:  Current Outpatient Prescriptions  Medication Sig Dispense Refill  . acetaminophen-codeine (TYLENOL #4) 300-60 MG tablet Take 2 tablets by mouth every 6 (six) hours as needed for moderate pain.     Marland Kitchen albuterol (PROAIR HFA) 108 (90 BASE) MCG/ACT inhaler Inhale 1 puff into the lungs every 6 (six) hours as needed for shortness of breath.     Marland Kitchen albuterol (PROVENTIL) (2.5 MG/3ML) 0.083% nebulizer solution Inhale 2.5 mg into the lungs every 6 (six) hours as needed for shortness of breath.     . ALPRAZolam (XANAX) 0.5 MG tablet Take 0.5 mg by mouth at bedtime as needed for anxiety or sleep.     Marland Kitchen amLODipine (NORVASC) 10 MG tablet Take 10 mg by mouth daily.     Marland Kitchen aspirin EC 81 MG tablet Take 81 mg by mouth once.     Marland Kitchen atenolol (TENORMIN) 50 MG tablet TAKE 1 1/2 TABLETS BY MOUTH TWICE DAILY    .  B-D ULTRA-FINE 33 LANCETS MISC Use 1 each 2 (two) times daily.    . B-D ULTRAFINE III SHORT PEN 31G X 8 MM MISC     . bumetanide (BUMEX) 0.5 MG tablet TAKE 1 TABLET BY MOUTH TWICE DAILY    . Cinnamon 500 MG capsule Take 500 mg by mouth daily.     . cloNIDine (CATAPRES) 0.2 MG tablet Take 0.2 mg by mouth daily.     . enalapril (VASOTEC) 20 MG tablet Take 20 mg by mouth daily.     Marland Kitchen FLUoxetine (PROZAC) 20 MG capsule Take 20 mg by mouth daily.     Marland Kitchen glucose blood (ONE TOUCH ULTRA TEST) test strip Use 1 each 2 (two) times daily. Use as instructed.    . glyBURIDE (DIABETA) 5 MG tablet Take 5 mg by mouth daily with breakfast.     . letrozole (FEMARA) 2.5 MG tablet Take 2.5 mg by mouth  daily.     Marland Kitchen LEVEMIR FLEXTOUCH 100 UNIT/ML Pen Inject 24 Units into the skin daily.     Marland Kitchen NOVOLOG FLEXPEN 100 UNIT/ML FlexPen     . palbociclib (IBRANCE) 125 MG capsule Take 1 capsule (125 mg total) by mouth daily with breakfast. Take whole with food., 1 pill daily for 3 weeks and 1 week off 21 capsule 6  . salmeterol (SEREVENT) 50 MCG/DOSE diskus inhaler Inhale 1 puff into the lungs 2 (two) times daily.    . simvastatin (ZOCOR) 20 MG tablet Take 20 mg by mouth daily at 6 PM.     . Vitamin D, Ergocalciferol, (DRISDOL) 50000 units CAPS capsule Take by mouth.     No current facility-administered medications for this visit.   Facility-Administered Medications Ordered in Other Visits  Medication Dose Route Frequency Provider Last Rate Last Dose  . sodium chloride flush (NS) 0.9 % injection 10 mL  10 mL Intravenous PRN Cammie Sickle, MD   10 mL at 12/14/15 0921    PHYSICAL EXAMINATION: ECOG PERFORMANCE STATUS: 0 - Asymptomatic  BP 133/79 mmHg  Pulse 81  Temp(Src) 97 F (36.1 C) (Tympanic)  Resp 18  Wt 188 lb 15 oz (85.7 kg)  Filed Weights   12/14/15 0942  Weight: 188 lb 15 oz (85.7 kg)    GENERAL: Well-nourished well-developed; Alert, no distress and comfortable.  She is Accompanied by her husband. EYES: no pallor or icterus OROPHARYNX: no thrush or ulceration; good dentition  NECK: supple, no masses felt LYMPH:  no palpable lymphadenopathy in the cervical, axillary or inguinal regions LUNGS: clear to auscultation and  No wheeze or crackles HEART/CVS: regular rate & rhythm and no murmurs; No lower extremity edema ABDOMEN:abdomen soft, non-tender and normal bowel sounds Musculoskeletal:no cyanosis of digits and no clubbing  PSYCH: alert & oriented x 3 with fluent speech NEURO: no focal motor/sensory deficits SKIN:  no rashes or significant lesions    LABORATORY DATA:  I have reviewed the data as listed    Component Value Date/Time   NA 133* 12/14/2015 0920   NA 130*  06/06/2014 1102   K 3.9 12/14/2015 0920   K 3.9 06/06/2014 1102   CL 101 12/14/2015 0920   CL 95* 06/06/2014 1102   CO2 22 12/14/2015 0920   CO2 28 06/06/2014 1102   GLUCOSE 402* 12/14/2015 0920   GLUCOSE 349* 06/06/2014 1102   BUN 47* 12/14/2015 0920   BUN 17 06/06/2014 1102   CREATININE 3.36* 12/14/2015 0920   CREATININE 1.63* 06/06/2014 1102   CALCIUM 9.2  12/14/2015 0920   CALCIUM 9.2 06/06/2014 1102   PROT 7.7 12/14/2015 0920   PROT 8.2 06/06/2014 1102   ALBUMIN 3.9 12/14/2015 0920   ALBUMIN 3.3* 06/06/2014 1102   AST 22 12/14/2015 0920   AST 7* 06/06/2014 1102   ALT 13* 12/14/2015 0920   ALT 12* 06/06/2014 1102   ALKPHOS 65 12/14/2015 0920   ALKPHOS 74 06/06/2014 1102   BILITOT 0.4 12/14/2015 0920   BILITOT 0.4 06/06/2014 1102   GFRNONAA 14* 12/14/2015 0920   GFRNONAA 35* 06/06/2014 1102   GFRAA 16* 12/14/2015 0920   GFRAA 42* 06/06/2014 1102    No results found for: SPEP, UPEP  Lab Results  Component Value Date   WBC 5.8 12/14/2015   NEUTROABS 4.6 12/14/2015   HGB 9.1* 12/14/2015   HCT 25.8* 12/14/2015   MCV 103.6* 12/14/2015   PLT 347 12/14/2015      Chemistry      Component Value Date/Time   NA 133* 12/14/2015 0920   NA 130* 06/06/2014 1102   K 3.9 12/14/2015 0920   K 3.9 06/06/2014 1102   CL 101 12/14/2015 0920   CL 95* 06/06/2014 1102   CO2 22 12/14/2015 0920   CO2 28 06/06/2014 1102   BUN 47* 12/14/2015 0920   BUN 17 06/06/2014 1102   CREATININE 3.36* 12/14/2015 0920   CREATININE 1.63* 06/06/2014 1102      Component Value Date/Time   CALCIUM 9.2 12/14/2015 0920   CALCIUM 9.2 06/06/2014 1102   ALKPHOS 65 12/14/2015 0920   ALKPHOS 74 06/06/2014 1102   AST 22 12/14/2015 0920   AST 7* 06/06/2014 1102   ALT 13* 12/14/2015 0920   ALT 12* 06/06/2014 1102   BILITOT 0.4 12/14/2015 0920   BILITOT 0.4 06/06/2014 1102        ASSESSMENT & PLAN:  # Left breast cancer stage IV/oligometastatic to the mediastinal lymph nodes currently on second  line therapy with ibrance and Femara since March 2016. clinically  NO evidence of progression. FEB 2017- PET- no progression/improved left breast mass/ stable-treated bone lesions. Discussed that we will plan to get a PET scan in the next one to 2 months.  # Patient will continue Femara with ibrance. Tolerating without any major side effects.   #  Elevated blood sugars-  402. Patient has been following up with PCP regarding better control of diabetes.  # CKD- creatinine- 3.36- likely secondary to poorly controlled blood sugars/history of hypertension. Appreciate nephrology recommendations.  # Anemia likely from chronic kidney disease/iron deficiency saturation 7%-  Hemoglobin 9.1 Fairly asymptomatically. Recommend by mouth iron twice a day.  # Metastatic disease to the bone-stable/ not on x-geva because of renal failure.  # We'll plan to get a CBC CMP prior to each cycle of treatment/follow-up with me in 4 weeks.     Cammie Sickle, MD 12/14/2015 10:10 AM

## 2015-12-19 DIAGNOSIS — J4520 Mild intermittent asthma, uncomplicated: Secondary | ICD-10-CM | POA: Diagnosis not present

## 2015-12-19 DIAGNOSIS — I10 Essential (primary) hypertension: Secondary | ICD-10-CM | POA: Diagnosis not present

## 2015-12-19 DIAGNOSIS — I5022 Chronic systolic (congestive) heart failure: Secondary | ICD-10-CM | POA: Diagnosis not present

## 2015-12-19 DIAGNOSIS — N184 Chronic kidney disease, stage 4 (severe): Secondary | ICD-10-CM | POA: Diagnosis not present

## 2015-12-19 DIAGNOSIS — E119 Type 2 diabetes mellitus without complications: Secondary | ICD-10-CM | POA: Diagnosis not present

## 2015-12-19 DIAGNOSIS — Z23 Encounter for immunization: Secondary | ICD-10-CM | POA: Diagnosis not present

## 2015-12-19 DIAGNOSIS — E782 Mixed hyperlipidemia: Secondary | ICD-10-CM | POA: Diagnosis not present

## 2015-12-19 DIAGNOSIS — C50812 Malignant neoplasm of overlapping sites of left female breast: Secondary | ICD-10-CM | POA: Diagnosis not present

## 2015-12-24 DIAGNOSIS — D631 Anemia in chronic kidney disease: Secondary | ICD-10-CM | POA: Diagnosis not present

## 2015-12-24 DIAGNOSIS — R809 Proteinuria, unspecified: Secondary | ICD-10-CM | POA: Diagnosis not present

## 2015-12-24 DIAGNOSIS — E1122 Type 2 diabetes mellitus with diabetic chronic kidney disease: Secondary | ICD-10-CM | POA: Diagnosis not present

## 2015-12-24 DIAGNOSIS — I129 Hypertensive chronic kidney disease with stage 1 through stage 4 chronic kidney disease, or unspecified chronic kidney disease: Secondary | ICD-10-CM | POA: Diagnosis not present

## 2015-12-24 DIAGNOSIS — N2581 Secondary hyperparathyroidism of renal origin: Secondary | ICD-10-CM | POA: Diagnosis not present

## 2015-12-24 DIAGNOSIS — N184 Chronic kidney disease, stage 4 (severe): Secondary | ICD-10-CM | POA: Diagnosis not present

## 2016-01-02 ENCOUNTER — Inpatient Hospital Stay: Payer: Medicare Other

## 2016-01-02 ENCOUNTER — Inpatient Hospital Stay (HOSPITAL_BASED_OUTPATIENT_CLINIC_OR_DEPARTMENT_OTHER): Payer: Medicare Other | Admitting: Internal Medicine

## 2016-01-02 ENCOUNTER — Telehealth: Payer: Self-pay | Admitting: *Deleted

## 2016-01-02 VITALS — BP 144/87 | HR 71 | Temp 97.1°F | Resp 18 | Wt 188.3 lb

## 2016-01-02 DIAGNOSIS — Z79811 Long term (current) use of aromatase inhibitors: Secondary | ICD-10-CM

## 2016-01-02 DIAGNOSIS — N189 Chronic kidney disease, unspecified: Secondary | ICD-10-CM

## 2016-01-02 DIAGNOSIS — Z79899 Other long term (current) drug therapy: Secondary | ICD-10-CM

## 2016-01-02 DIAGNOSIS — Z9221 Personal history of antineoplastic chemotherapy: Secondary | ICD-10-CM

## 2016-01-02 DIAGNOSIS — C778 Secondary and unspecified malignant neoplasm of lymph nodes of multiple regions: Secondary | ICD-10-CM

## 2016-01-02 DIAGNOSIS — Z7982 Long term (current) use of aspirin: Secondary | ICD-10-CM

## 2016-01-02 DIAGNOSIS — Z170 Estrogen receptor positive status [ER+]: Secondary | ICD-10-CM

## 2016-01-02 DIAGNOSIS — I69354 Hemiplegia and hemiparesis following cerebral infarction affecting left non-dominant side: Secondary | ICD-10-CM

## 2016-01-02 DIAGNOSIS — E1122 Type 2 diabetes mellitus with diabetic chronic kidney disease: Secondary | ICD-10-CM

## 2016-01-02 DIAGNOSIS — C50912 Malignant neoplasm of unspecified site of left female breast: Secondary | ICD-10-CM

## 2016-01-02 DIAGNOSIS — D631 Anemia in chronic kidney disease: Secondary | ICD-10-CM

## 2016-01-02 DIAGNOSIS — Z87891 Personal history of nicotine dependence: Secondary | ICD-10-CM

## 2016-01-02 DIAGNOSIS — C7951 Secondary malignant neoplasm of bone: Secondary | ICD-10-CM | POA: Diagnosis not present

## 2016-01-02 DIAGNOSIS — D509 Iron deficiency anemia, unspecified: Secondary | ICD-10-CM

## 2016-01-02 DIAGNOSIS — Z794 Long term (current) use of insulin: Secondary | ICD-10-CM

## 2016-01-02 LAB — CBC WITH DIFFERENTIAL/PLATELET
BASOS ABS: 0 10*3/uL (ref 0–0.1)
Basophils Relative: 0 %
EOS PCT: 1 %
Eosinophils Absolute: 0 10*3/uL (ref 0–0.7)
HCT: 29 % — ABNORMAL LOW (ref 35.0–47.0)
HEMOGLOBIN: 10.1 g/dL — AB (ref 12.0–16.0)
LYMPHS ABS: 1.3 10*3/uL (ref 1.0–3.6)
LYMPHS PCT: 27 %
MCH: 35.7 pg — ABNORMAL HIGH (ref 26.0–34.0)
MCHC: 34.7 g/dL (ref 32.0–36.0)
MCV: 102.9 fL — AB (ref 80.0–100.0)
Monocytes Absolute: 0.6 10*3/uL (ref 0.2–0.9)
Monocytes Relative: 13 %
NEUTROS ABS: 2.8 10*3/uL (ref 1.4–6.5)
NEUTROS PCT: 59 %
PLATELETS: 173 10*3/uL (ref 150–440)
RBC: 2.82 MIL/uL — AB (ref 3.80–5.20)
RDW: 13.9 % (ref 11.5–14.5)
WBC: 4.8 10*3/uL (ref 3.6–11.0)

## 2016-01-02 LAB — COMPREHENSIVE METABOLIC PANEL
ALK PHOS: 59 U/L (ref 38–126)
ALT: 13 U/L — AB (ref 14–54)
AST: 16 U/L (ref 15–41)
Albumin: 4.2 g/dL (ref 3.5–5.0)
Anion gap: 7 (ref 5–15)
BUN: 41 mg/dL — AB (ref 6–20)
CALCIUM: 9.2 mg/dL (ref 8.9–10.3)
CO2: 27 mmol/L (ref 22–32)
CREATININE: 2.88 mg/dL — AB (ref 0.44–1.00)
Chloride: 102 mmol/L (ref 101–111)
GFR calc Af Amer: 20 mL/min — ABNORMAL LOW (ref >60)
GFR, EST NON AFRICAN AMERICAN: 17 mL/min — AB (ref >60)
Glucose, Bld: 148 mg/dL — ABNORMAL HIGH (ref 65–99)
Potassium: 3.3 mmol/L — ABNORMAL LOW (ref 3.5–5.1)
SODIUM: 136 mmol/L (ref 135–145)
Total Bilirubin: 0.6 mg/dL (ref 0.3–1.2)
Total Protein: 8.2 g/dL — ABNORMAL HIGH (ref 6.5–8.1)

## 2016-01-02 NOTE — Progress Notes (Signed)
[place] OFFICE PROGRESS NOTE  Patient Care Team: [name], [name]   SUMMARY OF ONCOLOGIC HISTORY:  # OCT 2015-STAGE IV LEFT BREAST T2N1 [T=4cm; [name]; [name]; [name]; EBUS- Positive Paratrac/subcarinal LN s/p ? Taxotere [in [place]; [name]; [name];[name]; [name]; suv 3]; [name]; [name]  # ? Bony lesions- PET sep 2016-non-hypermetabolic sclerotic lesions T10; Ant R iliac bone; inferior sternum- not on X-geva  # Poorly controlled Blood sugars- improved.   # Pancreatitis Hx/ CKD [creat ~ 2-3]  INTERVAL HISTORY:  [name] with above history of metastatic left breast cancer currently on second line therapy with ibrance plus Femara is here for follow-up.    As per the patient her blood sugars have been better controlled in the last many weeks. Denies any nausea vomiting. Denies any diarrhea.    Denies any weight loss. No bone pain. No nausea vomiting diarrhea. No cough or shortness of breath. No swelling in the legs.   REVIEW OF SYSTEMS:  A complete 10 point review of system is done which is negative except mentioned above/history of present illness.   PAST MEDICAL HISTORY :  Past Medical History  Diagnosis Date  . History of left breast cancer 05/29/14  . Diabetes mellitus, type 2 ([place])   . Asthma   . Obesity   . Stroke [facility]) 2010    with mild left arm weakness  . Pancreatitis 1997  . CHF (congestive heart failure) ([place]) 1997  . CKD (chronic kidney disease)   . Depression   . Hair loss     PAST SURGICAL HISTORY :   Past Surgical History  Procedure Laterality Date  . Cesarean section    . Cholecystectomy      FAMILY HISTORY :   Family History  Problem  Relation Age of Onset  . Ovarian cancer Mother   . Diabetes Mother   . Hypertension Mother   . COPD Father   . Hypertension Father   . Diabetes Sister   . Diabetes Brother     SOCIAL HISTORY:   Social History  Substance Use Topics  . Smoking status: Former Smoker -- 0.50 packs/day for 1 years    Types: Cigarettes  . Smokeless tobacco: Never Used  . Alcohol Use: No    ALLERGIES:  has No Known Allergies.  MEDICATIONS:  Current Outpatient Prescriptions  Medication Sig Dispense Refill  . acetaminophen-codeine (TYLENOL #4) 300-60 MG tablet Take 2 tablets by mouth every 6 (six) hours as needed for moderate pain.     Marland Kitchen albuterol (PROAIR HFA) 108 (90 BASE) MCG/ACT inhaler Inhale 1 puff into the lungs every 6 (six) hours as needed for shortness of breath.     Marland Kitchen albuterol (PROVENTIL) (2.5 MG/3ML) 0.083% nebulizer solution Inhale 2.5 mg into the lungs every 6 (six) hours as needed for shortness of breath.     . ALPRAZolam (XANAX) 0.5 MG tablet Take 0.5 mg by mouth at bedtime as needed for anxiety or sleep.     Marland Kitchen amLODipine (NORVASC) 10 MG tablet Take 10 mg by mouth daily.     Marland Kitchen aspirin EC 81 MG tablet Take 81 mg by mouth once.     Marland Kitchen atenolol (TENORMIN) 50 MG tablet TAKE 1 1/2 TABLETS BY  MOUTH TWICE DAILY    . B-D ULTRA-FINE 33 LANCETS MISC Use 1 each 2 (two) times daily.    . B-D ULTRAFINE III SHORT PEN 31G X 8 MM MISC     . bumetanide (BUMEX) 0.5 MG tablet TAKE 1 TABLET BY MOUTH TWICE DAILY    . Cinnamon 500 MG capsule Take 500 mg by mouth daily.     . cloNIDine (CATAPRES) 0.2 MG tablet Take 0.2 mg by mouth daily.     . enalapril (VASOTEC) 20 MG tablet Take 20 mg by mouth daily.     Marland Kitchen FLUoxetine (PROZAC) 20 MG capsule Take 20 mg by mouth daily.     Marland Kitchen glucose blood (ONE TOUCH ULTRA TEST) test strip Use 1 each 2 (two) times daily. Use as instructed.    . glyBURIDE (DIABETA) 5 MG tablet Take 5 mg by mouth daily with breakfast.     . letrozole (FEMARA) 2.5 MG tablet Take 2.5 mg by mouth  daily.     Marland Kitchen LEVEMIR FLEXTOUCH 100 UNIT/ML Pen Inject 24 Units into the skin daily.     Marland Kitchen NOVOLOG FLEXPEN 100 UNIT/ML FlexPen     . palbociclib (IBRANCE) 125 MG capsule Take 1 capsule (125 mg total) by mouth daily with breakfast. Take whole with food., 1 pill daily for 3 weeks and 1 week off 21 capsule 6  . salmeterol (SEREVENT) 50 MCG/DOSE diskus inhaler Inhale 1 puff into the lungs 2 (two) times daily.    . simvastatin (ZOCOR) 20 MG tablet Take 20 mg by mouth daily at 6 PM.     . vitamin B-12 (CYANOCOBALAMIN) 1000 MCG tablet Take 1,000 mcg by mouth daily.     No current facility-administered medications for this visit.    PHYSICAL EXAMINATION: ECOG PERFORMANCE STATUS: 0 - Asymptomatic  BP 144/87 mmHg  Pulse 71  Temp(Src) 97.1 F (36.2 C) (Tympanic)  Resp 18  Wt 188 lb 4.4 oz (85.4 kg)  SpO2 100%  Filed Weights   01/02/16 1530  Weight: 188 lb 4.4 oz (85.4 kg)    GENERAL: Well-nourished well-developed; Alert, no distress and comfortable.  She is alone. EYES: no pallor or icterus OROPHARYNX: no thrush or ulceration; good dentition  NECK: supple, no masses felt LYMPH:  no palpable lymphadenopathy in the cervical, axillary or inguinal regions LUNGS: clear to auscultation and  No wheeze or crackles HEART/CVS: regular rate & rhythm and no murmurs; No lower extremity edema ABDOMEN:abdomen soft, non-tender and normal bowel sounds Musculoskeletal:no cyanosis of digits and no clubbing  PSYCH: alert & oriented x 3 with fluent speech NEURO: no focal motor/sensory deficits SKIN:  no rashes or significant lesions    LABORATORY DATA:  I have reviewed the data as listed    Component Value Date/Time   NA 136 01/02/2016 1512   NA 130* 06/06/2014 1102   K 3.3* 01/02/2016 1512   K 3.9 06/06/2014 1102   CL 102 01/02/2016 1512   CL 95* 06/06/2014 1102   CO2 27 01/02/2016 1512   CO2 28 06/06/2014 1102   GLUCOSE 148* 01/02/2016 1512   GLUCOSE 349* 06/06/2014 1102   BUN 41* 01/02/2016  1512   BUN 17 06/06/2014 1102   CREATININE 2.88* 01/02/2016 1512   CREATININE 1.63* 06/06/2014 1102   CALCIUM 9.2 01/02/2016 1512   CALCIUM 9.2 06/06/2014 1102   PROT 8.2* 01/02/2016 1512   PROT 8.2 06/06/2014 1102   ALBUMIN 4.2 01/02/2016 1512   ALBUMIN 3.3* 06/06/2014 1102   AST 16 01/02/2016  1512   AST 7* 06/06/2014 1102   ALT 13* 01/02/2016 1512   ALT 12* 06/06/2014 1102   ALKPHOS 59 01/02/2016 1512   ALKPHOS 74 06/06/2014 1102   BILITOT 0.6 01/02/2016 1512   BILITOT 0.4 06/06/2014 1102   GFRNONAA 17* 01/02/2016 1512   GFRNONAA 35* 06/06/2014 1102   GFRAA 20* 01/02/2016 1512   GFRAA 42* 06/06/2014 1102    No results found for: SPEP, UPEP  Lab Results  Component Value Date   WBC 4.8 01/02/2016   NEUTROABS 2.8 01/02/2016   HGB 10.1* 01/02/2016   HCT 29.0* 01/02/2016   MCV 102.9* 01/02/2016   PLT 173 01/02/2016      Chemistry      Component Value Date/Time   NA 136 01/02/2016 1512   NA 130* 06/06/2014 1102   K 3.3* 01/02/2016 1512   K 3.9 06/06/2014 1102   CL 102 01/02/2016 1512   CL 95* 06/06/2014 1102   CO2 27 01/02/2016 1512   CO2 28 06/06/2014 1102   BUN 41* 01/02/2016 1512   BUN 17 06/06/2014 1102   CREATININE 2.88* 01/02/2016 1512   CREATININE 1.63* 06/06/2014 1102      Component Value Date/Time   CALCIUM 9.2 01/02/2016 1512   CALCIUM 9.2 06/06/2014 1102   ALKPHOS 59 01/02/2016 1512   ALKPHOS 74 06/06/2014 1102   AST 16 01/02/2016 1512   AST 7* 06/06/2014 1102   ALT 13* 01/02/2016 1512   ALT 12* 06/06/2014 1102   BILITOT 0.6 01/02/2016 1512   BILITOT 0.4 06/06/2014 1102        ASSESSMENT & PLAN:  # Left breast cancer stage IV/oligometastatic to the mediastinal lymph nodes currently on second line therapy with ibrance and Femara since March 2016. clinically  NO evidence of progression. FEB 2017- PET- no progression/improved left breast mass/ stable-treated bone lesions. We'll plan to get a PET scan July August 2017.   # Patient will  continue Femara with ibrance. Tolerating without any major side effects. CBC within normal limits except for hemoglobin 10. Patient will start ibrance on June 2.  # Significantly improved blood sugars- today random blood glucose 148  # Chronic kidney disease creatinine today improved at 2.88.  # Anemia likely from chronic kidney disease/iron deficiency saturation 7%- currently on by mouth iron once a day. Hemoglobin improved at 10.  # Metastatic disease to the bone-stable/ not on x-geva because of renal failure.  # We'll plan to get a CBC CMP prior to each cycle of treatment/follow-up with me in 4 weeks.    Cammie Sickle, MD 01/02/2016 3:49 PM

## 2016-01-02 NOTE — Telephone Encounter (Signed)
Contacted pt. She missed her apt with Dr. Rogue Bussing this morning for breast cancer/Ibrance f/u.  Pt states that she forgot about this md apt.  Pt instructed & agreed to come at 1515 this afternoon for lab/md apt.  Dr. Rogue Bussing made aware.

## 2016-01-02 NOTE — Progress Notes (Signed)
Patient here for F/U on her treatment regime of Ibrance and Femara. Denies hot flashes or any side effects. Appetite is good. Occ constipation from the oral iron pills that she takes. Reports energy is normal.

## 2016-01-23 ENCOUNTER — Telehealth: Payer: Self-pay | Admitting: *Deleted

## 2016-01-23 NOTE — Telephone Encounter (Signed)
Patient came to clinic today to have her port checked.  She states when she got up this morning it was bleeding.  RN called to registration to speak with patient.   Assessed port and found her to have an open area along the scar line above the port. The open area was less than "pin head" size.  Palpated area around it and patient stated there was no tenderness or soreness.  Area not red or bruised.  Prior to coming to clinic patient had covered the area with a bandaid.  There was no blood or drainage on the bandaid.  Patient is due to have port flushed one week from today. Advised patient to apply antibiotic cream to area as precaution and to call us back if she notes any more drainage, soreness or tenderness. Patient verbalized understanding.

## 2016-01-30 ENCOUNTER — Inpatient Hospital Stay: Payer: Medicare Other

## 2016-01-30 ENCOUNTER — Inpatient Hospital Stay (HOSPITAL_BASED_OUTPATIENT_CLINIC_OR_DEPARTMENT_OTHER): Payer: Medicare Other | Admitting: Internal Medicine

## 2016-01-30 ENCOUNTER — Inpatient Hospital Stay: Payer: Medicare Other | Attending: Internal Medicine

## 2016-01-30 VITALS — BP 170/96 | HR 82 | Temp 97.0°F | Resp 18 | Wt 192.7 lb

## 2016-01-30 DIAGNOSIS — E1122 Type 2 diabetes mellitus with diabetic chronic kidney disease: Secondary | ICD-10-CM | POA: Insufficient documentation

## 2016-01-30 DIAGNOSIS — Z8673 Personal history of transient ischemic attack (TIA), and cerebral infarction without residual deficits: Secondary | ICD-10-CM | POA: Diagnosis not present

## 2016-01-30 DIAGNOSIS — C7951 Secondary malignant neoplasm of bone: Secondary | ICD-10-CM | POA: Diagnosis not present

## 2016-01-30 DIAGNOSIS — N189 Chronic kidney disease, unspecified: Secondary | ICD-10-CM | POA: Insufficient documentation

## 2016-01-30 DIAGNOSIS — Z95828 Presence of other vascular implants and grafts: Secondary | ICD-10-CM

## 2016-01-30 DIAGNOSIS — Z79899 Other long term (current) drug therapy: Secondary | ICD-10-CM | POA: Insufficient documentation

## 2016-01-30 DIAGNOSIS — D649 Anemia, unspecified: Secondary | ICD-10-CM | POA: Insufficient documentation

## 2016-01-30 DIAGNOSIS — F329 Major depressive disorder, single episode, unspecified: Secondary | ICD-10-CM | POA: Diagnosis not present

## 2016-01-30 DIAGNOSIS — Z170 Estrogen receptor positive status [ER+]: Secondary | ICD-10-CM

## 2016-01-30 DIAGNOSIS — J45909 Unspecified asthma, uncomplicated: Secondary | ICD-10-CM

## 2016-01-30 DIAGNOSIS — Z794 Long term (current) use of insulin: Secondary | ICD-10-CM | POA: Insufficient documentation

## 2016-01-30 DIAGNOSIS — E669 Obesity, unspecified: Secondary | ICD-10-CM | POA: Diagnosis not present

## 2016-01-30 DIAGNOSIS — C50411 Malignant neoplasm of upper-outer quadrant of right female breast: Secondary | ICD-10-CM | POA: Diagnosis not present

## 2016-01-30 DIAGNOSIS — Z7982 Long term (current) use of aspirin: Secondary | ICD-10-CM

## 2016-01-30 DIAGNOSIS — C50412 Malignant neoplasm of upper-outer quadrant of left female breast: Principal | ICD-10-CM

## 2016-01-30 DIAGNOSIS — Z87891 Personal history of nicotine dependence: Secondary | ICD-10-CM | POA: Diagnosis not present

## 2016-01-30 DIAGNOSIS — I129 Hypertensive chronic kidney disease with stage 1 through stage 4 chronic kidney disease, or unspecified chronic kidney disease: Secondary | ICD-10-CM | POA: Diagnosis not present

## 2016-01-30 DIAGNOSIS — I509 Heart failure, unspecified: Secondary | ICD-10-CM | POA: Diagnosis not present

## 2016-01-30 DIAGNOSIS — Z8719 Personal history of other diseases of the digestive system: Secondary | ICD-10-CM | POA: Insufficient documentation

## 2016-01-30 DIAGNOSIS — C50912 Malignant neoplasm of unspecified site of left female breast: Secondary | ICD-10-CM

## 2016-01-30 DIAGNOSIS — E509 Vitamin A deficiency, unspecified: Secondary | ICD-10-CM

## 2016-01-30 LAB — CBC WITH DIFFERENTIAL/PLATELET
Basophils Absolute: 0 10*3/uL (ref 0–0.1)
Basophils Relative: 1 %
Eosinophils Absolute: 0 10*3/uL (ref 0–0.7)
Eosinophils Relative: 1 %
HCT: 28.4 % — ABNORMAL LOW (ref 35.0–47.0)
HEMOGLOBIN: 10.1 g/dL — AB (ref 12.0–16.0)
LYMPHS PCT: 38 %
Lymphs Abs: 1.3 10*3/uL (ref 1.0–3.6)
MCH: 36.2 pg — AB (ref 26.0–34.0)
MCHC: 35.4 g/dL (ref 32.0–36.0)
MCV: 102 fL — AB (ref 80.0–100.0)
Monocytes Absolute: 0.3 10*3/uL (ref 0.2–0.9)
Monocytes Relative: 10 %
NEUTROS ABS: 1.7 10*3/uL (ref 1.4–6.5)
NEUTROS PCT: 50 %
Platelets: 174 10*3/uL (ref 150–440)
RBC: 2.79 MIL/uL — AB (ref 3.80–5.20)
RDW: 13.8 % (ref 11.5–14.5)
WBC: 3.5 10*3/uL — AB (ref 3.6–11.0)

## 2016-01-30 LAB — COMPREHENSIVE METABOLIC PANEL
ALK PHOS: 54 U/L (ref 38–126)
ALT: 14 U/L (ref 14–54)
AST: 15 U/L (ref 15–41)
Albumin: 4.1 g/dL (ref 3.5–5.0)
Anion gap: 6 (ref 5–15)
BUN: 44 mg/dL — AB (ref 6–20)
CALCIUM: 9 mg/dL (ref 8.9–10.3)
CO2: 25 mmol/L (ref 22–32)
CREATININE: 2.83 mg/dL — AB (ref 0.44–1.00)
Chloride: 105 mmol/L (ref 101–111)
GFR, EST AFRICAN AMERICAN: 20 mL/min — AB (ref >60)
GFR, EST NON AFRICAN AMERICAN: 17 mL/min — AB (ref >60)
Glucose, Bld: 64 mg/dL — ABNORMAL LOW (ref 65–99)
Potassium: 3.6 mmol/L (ref 3.5–5.1)
Sodium: 136 mmol/L (ref 135–145)
Total Bilirubin: 0.7 mg/dL (ref 0.3–1.2)
Total Protein: 7.8 g/dL (ref 6.5–8.1)

## 2016-01-30 MED ORDER — HEPARIN SOD (PORK) LOCK FLUSH 100 UNIT/ML IV SOLN
500.0000 [IU] | Freq: Once | INTRAVENOUS | Status: AC
  Administered 2016-01-30: 500 [IU] via INTRAVENOUS

## 2016-01-30 MED ORDER — SODIUM CHLORIDE 0.9% FLUSH
10.0000 mL | INTRAVENOUS | Status: AC | PRN
  Administered 2016-01-30: 10 mL via INTRAVENOUS
  Filled 2016-01-30: qty 10

## 2016-01-30 NOTE — Progress Notes (Signed)
[place] OFFICE PROGRESS NOTE  Patient Care Team: [name], [name]   SUMMARY OF ONCOLOGIC HISTORY:  Oncology History   # OCT 2015-STAGE IV LEFT BREAST T2N1 [T=4cm; [name]; [name]; [name]; EBUS- Positive Paratrac/subcarinal LN s/p ? Taxotere [in [place]; [name]; [name];[name]; [name]; suv 3]; [name]; [name]  # ? Bony lesions- PET sep 2016-non-hypermetabolic sclerotic lesions T10; Ant R iliac bone; inferior sternum- not on X-geva  # Poorly controlled Blood sugars- improved.   # Pancreatitis Hx/ CKD [creat ~ 2-3]     Malignant neoplasm of upper-outer quadrant of female breast ([place])   08/23/2015 Initial Diagnosis Malignant neoplasm of upper-outer quadrant of female breast ([place])     INTERVAL HISTORY:  [name] with above history of metastatic left breast cancer currently on second line therapy with ibrance plus Femara is here for follow-up.     Patient states her blood sugars have been better controlled.  She in fact felt that her sugars have been running low this morning-  She had treated track her. Denies any nausea vomiting. Denies any diarrhea.    Denies any weight loss. No bone pain. No nausea vomiting.  No cough or shortness of breath. No swelling in the legs.   REVIEW OF SYSTEMS:  A complete 10 point review of system is done which is negative except mentioned above/history of present illness.   PAST MEDICAL HISTORY :  Past Medical History  Diagnosis Date  . History of left breast cancer 05/29/14  . Diabetes mellitus, type 2 ([place])   . Asthma   . Obesity   . Stroke [facility]) 2010    with mild left arm weakness  . Pancreatitis 1997  . CHF (congestive heart  failure) ([place]) 1997  . CKD (chronic kidney disease)   . Depression   . Hair loss     PAST SURGICAL HISTORY :   Past Surgical History  Procedure Laterality Date  . Cesarean section    . Cholecystectomy      FAMILY HISTORY :   Family History  Problem Relation Age of Onset  . Ovarian cancer Mother   . Diabetes Mother   . Hypertension Mother   . COPD Father   . Hypertension Father   . Diabetes Sister   . Diabetes Brother     SOCIAL HISTORY:   Social History  Substance Use Topics  . Smoking status: Former Smoker -- 0.50 packs/day for 1 years    Types: Cigarettes  . Smokeless tobacco: Never Used  . Alcohol Use: No    ALLERGIES:  has No Known Allergies.  MEDICATIONS:  Current Outpatient Prescriptions  Medication Sig Dispense Refill  . acetaminophen-codeine (TYLENOL #4) 300-60 MG tablet Take 2 tablets by mouth every 6 (six) hours as needed for moderate pain.     Marland Kitchen albuterol (PROAIR HFA) 108 (90 BASE) MCG/ACT inhaler Inhale 1 puff into the lungs every 6 (six) hours as needed for shortness of breath.     Marland Kitchen albuterol (PROVENTIL) (2.5 MG/3ML) 0.083% nebulizer solution Inhale 2.5 mg into the lungs every 6 (six) hours as needed for shortness of breath.     . ALPRAZolam (XANAX) 0.5 MG tablet Take 0.5 mg by mouth at bedtime as needed for anxiety or  sleep.     . amLODipine (NORVASC) 10 MG tablet Take 10 mg by mouth daily.     Marland Kitchen aspirin EC 81 MG tablet Take 81 mg by mouth once.     Marland Kitchen atenolol (TENORMIN) 50 MG tablet TAKE 1 1/2 TABLETS BY MOUTH TWICE DAILY    . B-D ULTRA-FINE 33 LANCETS MISC Use 1 each 2 (two) times daily.    . B-D ULTRAFINE III SHORT PEN 31G X 8 MM MISC     . bumetanide (BUMEX) 0.5 MG tablet TAKE 1 TABLET BY MOUTH TWICE DAILY    . Cinnamon 500 MG capsule Take 500 mg by mouth daily.     . cloNIDine (CATAPRES) 0.2 MG tablet Take 0.2 mg by mouth daily.     . enalapril (VASOTEC) 20 MG tablet Take 20 mg by mouth daily.     Marland Kitchen FLUoxetine (PROZAC) 20 MG capsule Take 20 mg  by mouth daily.     Marland Kitchen glucose blood (ONE TOUCH ULTRA TEST) test strip Use 1 each 2 (two) times daily. Use as instructed.    . glyBURIDE (DIABETA) 5 MG tablet Take 5 mg by mouth daily with breakfast.     . letrozole (FEMARA) 2.5 MG tablet Take 2.5 mg by mouth daily.     Marland Kitchen LEVEMIR FLEXTOUCH 100 UNIT/ML Pen Inject 24 Units into the skin daily.     Marland Kitchen NOVOLOG FLEXPEN 100 UNIT/ML FlexPen     . palbociclib (IBRANCE) 125 MG capsule Take 1 capsule (125 mg total) by mouth daily with breakfast. Take whole with food., 1 pill daily for 3 weeks and 1 week off 21 capsule 6  . salmeterol (SEREVENT) 50 MCG/DOSE diskus inhaler Inhale 1 puff into the lungs 2 (two) times daily.    . simvastatin (ZOCOR) 20 MG tablet Take 20 mg by mouth daily at 6 PM.     . vitamin B-12 (CYANOCOBALAMIN) 1000 MCG tablet Take 1,000 mcg by mouth daily.     No current facility-administered medications for this visit.   Facility-Administered Medications Ordered in Other Visits  Medication Dose Route Frequency Provider Last Rate Last Dose  . sodium chloride flush (NS) 0.9 % injection 10 mL  10 mL Intravenous PRN Cammie Sickle, MD   10 mL at 01/30/16 1054    PHYSICAL EXAMINATION: ECOG PERFORMANCE STATUS: 0 - Asymptomatic  BP 170/96 mmHg  Pulse 82  Temp(Src) 97 F (36.1 C) (Tympanic)  Resp 18  Wt 192 lb 10.9 oz (87.4 kg)  Filed Weights   01/30/16 1149  Weight: 192 lb 10.9 oz (87.4 kg)    GENERAL: Well-nourished well-developed; Alert, no distress and comfortable.  She is alone. EYES: no pallor or icterus OROPHARYNX: no thrush or ulceration; good dentition  NECK: supple, no masses felt LYMPH:  no palpable lymphadenopathy in the cervical, axillary or inguinal regions LUNGS: clear to auscultation and  No wheeze or crackles HEART/CVS: regular rate & rhythm and no murmurs; No lower extremity edema ABDOMEN:abdomen soft, non-tender and normal bowel sounds Musculoskeletal:no cyanosis of digits and no clubbing  PSYCH: alert  & oriented x 3 with fluent speech NEURO: no focal motor/sensory deficits SKIN:  no rashes or significant lesions    LABORATORY DATA:  I have reviewed the data as listed    Component Value Date/Time   NA 136 01/30/2016 1051   NA 130* 06/06/2014 1102   K 3.6 01/30/2016 1051   K 3.9 06/06/2014 1102   CL 105 01/30/2016 1051   CL 95* 06/06/2014  1102   CO2 25 01/30/2016 1051   CO2 28 06/06/2014 1102   GLUCOSE 64* 01/30/2016 1051   GLUCOSE 349* 06/06/2014 1102   BUN 44* 01/30/2016 1051   BUN 17 06/06/2014 1102   CREATININE 2.83* 01/30/2016 1051   CREATININE 1.63* 06/06/2014 1102   CALCIUM 9.0 01/30/2016 1051   CALCIUM 9.2 06/06/2014 1102   PROT 7.8 01/30/2016 1051   PROT 8.2 06/06/2014 1102   ALBUMIN 4.1 01/30/2016 1051   ALBUMIN 3.3* 06/06/2014 1102   AST 15 01/30/2016 1051   AST 7* 06/06/2014 1102   ALT 14 01/30/2016 1051   ALT 12* 06/06/2014 1102   ALKPHOS 54 01/30/2016 1051   ALKPHOS 74 06/06/2014 1102   BILITOT 0.7 01/30/2016 1051   BILITOT 0.4 06/06/2014 1102   GFRNONAA 17* 01/30/2016 1051   GFRNONAA 35* 06/06/2014 1102   GFRAA 20* 01/30/2016 1051   GFRAA 42* 06/06/2014 1102    No results found for: SPEP, UPEP  Lab Results  Component Value Date   WBC 3.5* 01/30/2016   NEUTROABS 1.7 01/30/2016   HGB 10.1* 01/30/2016   HCT 28.4* 01/30/2016   MCV 102.0* 01/30/2016   PLT 174 01/30/2016      Chemistry      Component Value Date/Time   NA 136 01/30/2016 1051   NA 130* 06/06/2014 1102   K 3.6 01/30/2016 1051   K 3.9 06/06/2014 1102   CL 105 01/30/2016 1051   CL 95* 06/06/2014 1102   CO2 25 01/30/2016 1051   CO2 28 06/06/2014 1102   BUN 44* 01/30/2016 1051   BUN 17 06/06/2014 1102   CREATININE 2.83* 01/30/2016 1051   CREATININE 1.63* 06/06/2014 1102      Component Value Date/Time   CALCIUM 9.0 01/30/2016 1051   CALCIUM 9.2 06/06/2014 1102   ALKPHOS 54 01/30/2016 1051   ALKPHOS 74 06/06/2014 1102   AST 15 01/30/2016 1051   AST 7* 06/06/2014 1102    ALT 14 01/30/2016 1051   ALT 12* 06/06/2014 1102   BILITOT 0.7 01/30/2016 1051   BILITOT 0.4 06/06/2014 1102        ASSESSMENT & PLAN:  Malignant neoplasm of upper-outer quadrant of female breast [facility]) # Left breast cancer stage IV/oligometastatic to the mediastinal lymph nodes currently  with ibrance and Femara since March 2016. clinically  NO evidence of progression. FEB 2017- PET- no progression/improved left breast mass/ stable-treated bone lesions. We'll plan to get a PET scan July August 2017.  # Patient will continue Femara with ibrance. Tolerating without any major side effects. CBC within normal limits except for hemoglobin 10. Patient will start ibrance on July 2nd..   # Significantly improved blood sugars- Today glucose is 64.   # Chronic kidney disease creatinine today improved at 2.83..   # Anemia likely from chronic kidney disease/iron deficiency saturation 7%- currently on by mouth iron once a day. Hemoglobin improved at 10.  # Metastatic disease to the bone-stable/ not on x-geva because of renal failure.  # We'll plan to get a CBC CMP prior to each cycle of treatment/follow-up with me in 4 weeks.       Cammie Sickle, MD 01/30/2016 4:31 PM

## 2016-01-30 NOTE — Assessment & Plan Note (Addendum)
#   Left breast cancer stage IV/oligometastatic to the mediastinal lymph nodes currently  with ibrance and Femara since March 2016. clinically  NO evidence of progression. FEB 2017- PET- no progression/improved left breast mass/ stable-treated bone lesions. We'll plan to get a PET scan July August 2017.  # Patient will continue Femara with ibrance. Tolerating without any major side effects. CBC within normal limits except for hemoglobin 10. Patient will start ibrance on July 2nd..   # Significantly improved blood sugars- Today glucose is 64.   # Chronic kidney disease creatinine today improved at 2.83..   # Anemia likely from chronic kidney disease/iron deficiency saturation 7%- currently on by mouth iron once a day. Hemoglobin improved at 10.  # Metastatic disease to the bone-stable/ not on x-geva because of renal failure.  # We'll plan to get a CBC CMP prior to each cycle of treatment/follow-up with me in 4 weeks.

## 2016-02-28 ENCOUNTER — Inpatient Hospital Stay: Payer: Medicare Other | Attending: Internal Medicine

## 2016-02-28 ENCOUNTER — Inpatient Hospital Stay (HOSPITAL_BASED_OUTPATIENT_CLINIC_OR_DEPARTMENT_OTHER): Payer: Medicare Other | Admitting: Internal Medicine

## 2016-02-28 DIAGNOSIS — Z87891 Personal history of nicotine dependence: Secondary | ICD-10-CM | POA: Insufficient documentation

## 2016-02-28 DIAGNOSIS — Z8673 Personal history of transient ischemic attack (TIA), and cerebral infarction without residual deficits: Secondary | ICD-10-CM

## 2016-02-28 DIAGNOSIS — Z170 Estrogen receptor positive status [ER+]: Secondary | ICD-10-CM | POA: Insufficient documentation

## 2016-02-28 DIAGNOSIS — C779 Secondary and unspecified malignant neoplasm of lymph node, unspecified: Secondary | ICD-10-CM | POA: Diagnosis not present

## 2016-02-28 DIAGNOSIS — D649 Anemia, unspecified: Secondary | ICD-10-CM

## 2016-02-28 DIAGNOSIS — E669 Obesity, unspecified: Secondary | ICD-10-CM | POA: Diagnosis not present

## 2016-02-28 DIAGNOSIS — E1165 Type 2 diabetes mellitus with hyperglycemia: Secondary | ICD-10-CM

## 2016-02-28 DIAGNOSIS — N189 Chronic kidney disease, unspecified: Secondary | ICD-10-CM | POA: Diagnosis not present

## 2016-02-28 DIAGNOSIS — Z79899 Other long term (current) drug therapy: Secondary | ICD-10-CM | POA: Insufficient documentation

## 2016-02-28 DIAGNOSIS — Z7982 Long term (current) use of aspirin: Secondary | ICD-10-CM | POA: Diagnosis not present

## 2016-02-28 DIAGNOSIS — I129 Hypertensive chronic kidney disease with stage 1 through stage 4 chronic kidney disease, or unspecified chronic kidney disease: Secondary | ICD-10-CM | POA: Insufficient documentation

## 2016-02-28 DIAGNOSIS — C7951 Secondary malignant neoplasm of bone: Secondary | ICD-10-CM | POA: Diagnosis not present

## 2016-02-28 DIAGNOSIS — Z79811 Long term (current) use of aromatase inhibitors: Secondary | ICD-10-CM | POA: Diagnosis not present

## 2016-02-28 DIAGNOSIS — C50411 Malignant neoplasm of upper-outer quadrant of right female breast: Secondary | ICD-10-CM

## 2016-02-28 DIAGNOSIS — J45909 Unspecified asthma, uncomplicated: Secondary | ICD-10-CM | POA: Insufficient documentation

## 2016-02-28 DIAGNOSIS — C50412 Malignant neoplasm of upper-outer quadrant of left female breast: Secondary | ICD-10-CM

## 2016-02-28 DIAGNOSIS — F329 Major depressive disorder, single episode, unspecified: Secondary | ICD-10-CM | POA: Insufficient documentation

## 2016-02-28 DIAGNOSIS — I509 Heart failure, unspecified: Secondary | ICD-10-CM | POA: Insufficient documentation

## 2016-02-28 LAB — COMPREHENSIVE METABOLIC PANEL
ALK PHOS: 53 U/L (ref 38–126)
ALT: 13 U/L — AB (ref 14–54)
AST: 16 U/L (ref 15–41)
Albumin: 4.2 g/dL (ref 3.5–5.0)
Anion gap: 7 (ref 5–15)
BILIRUBIN TOTAL: 0.6 mg/dL (ref 0.3–1.2)
BUN: 47 mg/dL — ABNORMAL HIGH (ref 6–20)
CO2: 26 mmol/L (ref 22–32)
CREATININE: 3.02 mg/dL — AB (ref 0.44–1.00)
Calcium: 9.1 mg/dL (ref 8.9–10.3)
Chloride: 103 mmol/L (ref 101–111)
GFR, EST AFRICAN AMERICAN: 19 mL/min — AB (ref >60)
GFR, EST NON AFRICAN AMERICAN: 16 mL/min — AB (ref >60)
Glucose, Bld: 150 mg/dL — ABNORMAL HIGH (ref 65–99)
Potassium: 3.4 mmol/L — ABNORMAL LOW (ref 3.5–5.1)
Sodium: 136 mmol/L (ref 135–145)
Total Protein: 7.8 g/dL (ref 6.5–8.1)

## 2016-02-28 LAB — CBC WITH DIFFERENTIAL/PLATELET
Basophils Absolute: 0.1 10*3/uL (ref 0–0.1)
Basophils Relative: 2 %
Eosinophils Absolute: 0 10*3/uL (ref 0–0.7)
Eosinophils Relative: 1 %
HCT: 28.2 % — ABNORMAL LOW (ref 35.0–47.0)
HEMOGLOBIN: 10 g/dL — AB (ref 12.0–16.0)
LYMPHS ABS: 1 10*3/uL (ref 1.0–3.6)
LYMPHS PCT: 31 %
MCH: 35.9 pg — ABNORMAL HIGH (ref 26.0–34.0)
MCHC: 35.3 g/dL (ref 32.0–36.0)
MCV: 101.7 fL — AB (ref 80.0–100.0)
MONOS PCT: 8 %
Monocytes Absolute: 0.3 10*3/uL (ref 0.2–0.9)
NEUTROS PCT: 58 %
Neutro Abs: 1.9 10*3/uL (ref 1.4–6.5)
Platelets: 196 10*3/uL (ref 150–440)
RBC: 2.77 MIL/uL — AB (ref 3.80–5.20)
RDW: 14.1 % (ref 11.5–14.5)
WBC: 3.4 10*3/uL — AB (ref 3.6–11.0)

## 2016-02-28 NOTE — Assessment & Plan Note (Signed)
#   Left breast cancer stage IV/oligometastatic to the mediastinal lymph nodes currently  with ibrance and Femara since March 2016. clinically  NO evidence of progression. FEB 2017- PET- no progression/improved left breast mass/ stable-treated bone lesions. Order PET now.   # Patient will continue Femara with ibrance. Tolerating without any major side effects. CBC within normal limits except for hemoglobin 10. Patient will start ibrance on July 2nd..   # Significantly improved blood sugars- Today glucose is 64.   # Chronic kidney disease creatinine today improved at 2.83..   # Anemia likely from chronic kidney disease/iron deficiency saturation 7%- currently on by mouth iron once a day. Hemoglobin improved at 10.  # Metastatic disease to the bone-stable/ not on x-geva because of renal failure.  # We'll plan to get a CBC CMP prior to each cycle of treatment/follow-up with me in 4 weeks.

## 2016-02-28 NOTE — Progress Notes (Signed)
[facility] OFFICE PROGRESS NOTE  Patient Care Team: [name], [name]   SUMMARY OF ONCOLOGIC HISTORY:  Oncology History   # OCT 2015-STAGE IV LEFT BREAST T2N1 [T=4cm; [name]; [name]; [name]; EBUS- Positive Paratrac/subcarinal LN s/p ? Taxotere [in [place]; [name]; [name];[name]; [name]; suv 3]; [name]; [name]  # ? Bony lesions- PET sep 2016-non-hypermetabolic sclerotic lesions T10; Ant R iliac bone; inferior sternum- not on X-geva  # Poorly controlled Blood sugars- improved.   # Pancreatitis Hx/ CKD [creat ~ 2-3]     Malignant neoplasm of upper-outer quadrant of female breast ([place])   08/23/2015 Initial Diagnosis    Malignant neoplasm of upper-outer quadrant of female breast ([place])      Breast cancer of upper-outer quadrant of left female breast ([place])   02/28/2016 Initial Diagnosis    Breast cancer of upper-outer quadrant of left female breast ([place])       INTERVAL HISTORY:  [name] with above history of metastatic left breast cancer currently on second line therapy with ibrance plus Femara is here for follow-up.     patient denies any unusual back pain or headaches.. Denies any nausea vomiting. Denies any diarrhea.  Denies any new lumps or bumps.   Denies any weight loss. No bone pain. No nausea vomiting.  No cough or shortness of breath. No swelling in the legs.   REVIEW OF SYSTEMS:  A complete 10 point review of system is done which is negative except mentioned above/history of present illness.   PAST MEDICAL HISTORY :  Past Medical History:  Diagnosis Date  . Asthma   . CHF (congestive heart failure) ([place]) 1997  . CKD (chronic kidney disease)   .  Depression   . Diabetes mellitus, type 2 ([place])   . Hair loss   . History of left breast cancer 05/29/14  . Obesity   . Pancreatitis 1997  . Stroke [facility]) 2010   with mild left arm weakness    PAST SURGICAL HISTORY :   Past Surgical History:  Procedure Laterality Date  . CESAREAN SECTION    . CHOLECYSTECTOMY      FAMILY HISTORY :   Family History  Problem Relation Age of Onset  . Ovarian cancer Mother   . Diabetes Mother   . Hypertension Mother   . COPD Father   . Hypertension Father   . Diabetes Sister   . Diabetes Brother     SOCIAL HISTORY:   Social History  Substance Use Topics  . Smoking status: Former Smoker    Packs/day: 0.50    Years: 1.00    Types: Cigarettes  . Smokeless tobacco: Never Used  . Alcohol use No    ALLERGIES:  has No Known Allergies.  MEDICATIONS:  Current Outpatient Prescriptions  Medication Sig Dispense Refill  . acetaminophen-codeine (TYLENOL #4) 300-60 MG tablet Take 2 tablets by mouth every 6 (six) hours as needed for moderate pain.     Marland Kitchen albuterol (PROAIR HFA) 108 (90 BASE) MCG/ACT inhaler Inhale 1 puff into the lungs every 6 (six) hours as needed for shortness of breath.     Marland Kitchen albuterol (PROVENTIL) (2.5 MG/3ML) 0.083% nebulizer solution Inhale 2.5 mg into the lungs every 6 (six) hours as needed  for shortness of breath.     . ALPRAZolam (XANAX) 0.5 MG tablet Take 0.5 mg by mouth at bedtime as needed for anxiety or sleep.     Marland Kitchen amLODipine (NORVASC) 10 MG tablet Take 10 mg by mouth daily.     Marland Kitchen aspirin EC 81 MG tablet Take 81 mg by mouth once.     Marland Kitchen atenolol (TENORMIN) 50 MG tablet TAKE 1 1/2 TABLETS BY MOUTH TWICE DAILY    . B-D ULTRA-FINE 33 LANCETS MISC Use 1 each 2 (two) times daily.    . B-D ULTRAFINE III SHORT PEN 31G X 8 MM MISC     . bumetanide (BUMEX) 0.5 MG tablet TAKE 1 TABLET BY MOUTH TWICE DAILY    . Cinnamon 500 MG capsule Take 500 mg by mouth daily.     . cloNIDine (CATAPRES) 0.2 MG tablet Take 0.2 mg by mouth daily.      . enalapril (VASOTEC) 20 MG tablet Take 20 mg by mouth daily.     Marland Kitchen FLUoxetine (PROZAC) 20 MG capsule Take 20 mg by mouth daily.     Marland Kitchen glucose blood (ONE TOUCH ULTRA TEST) test strip Use 1 each 2 (two) times daily. Use as instructed.    . glyBURIDE (DIABETA) 5 MG tablet Take 5 mg by mouth daily with breakfast.     . letrozole (FEMARA) 2.5 MG tablet Take 2.5 mg by mouth daily.     Marland Kitchen LEVEMIR FLEXTOUCH 100 UNIT/ML Pen Inject 24 Units into the skin daily.     Marland Kitchen NOVOLOG FLEXPEN 100 UNIT/ML FlexPen     . palbociclib (IBRANCE) 125 MG capsule Take 1 capsule (125 mg total) by mouth daily with breakfast. Take whole with food., 1 pill daily for 3 weeks and 1 week off 21 capsule 6  . salmeterol (SEREVENT) 50 MCG/DOSE diskus inhaler Inhale 1 puff into the lungs 2 (two) times daily.    . simvastatin (ZOCOR) 20 MG tablet Take 20 mg by mouth daily at 6 PM.     . vitamin B-12 (CYANOCOBALAMIN) 1000 MCG tablet Take 1,000 mcg by mouth daily.     No current facility-administered medications for this visit.    Facility-Administered Medications Ordered in Other Visits  Medication Dose Route Frequency Provider Last Rate Last Dose  . sodium chloride flush (NS) 0.9 % injection 10 mL  10 mL Intravenous PRN Cammie Sickle, MD   10 mL at 01/30/16 1054    PHYSICAL EXAMINATION: ECOG PERFORMANCE STATUS: 0 - Asymptomatic  BP (!) 157/95 (BP Location: Left Arm, Patient Position: Sitting)   Pulse 67   Temp (!) 96.6 F (35.9 C) (Tympanic)   Resp 18   Wt 193 lb 6 oz (87.7 kg)   BMI 32.38 kg/m   Filed Weights   02/28/16 1124  Weight: 193 lb 6 oz (87.7 kg)    GENERAL: Well-nourished well-developed; Alert, no distress and comfortable.  She is alone. EYES: no pallor or icterus OROPHARYNX: no thrush or ulceration; good dentition  NECK: supple, no masses felt LYMPH:  no palpable lymphadenopathy in the cervical, axillary or inguinal regions LUNGS: clear to auscultation and  No wheeze or crackles HEART/CVS:  regular rate & rhythm and no murmurs; No lower extremity edema ABDOMEN:abdomen soft, non-tender and normal bowel sounds Musculoskeletal:no cyanosis of digits and no clubbing  PSYCH: alert & oriented x 3 with fluent speech NEURO: no focal motor/sensory deficits SKIN:  no rashes or significant lesions    LABORATORY DATA:  I have reviewed  the data as listed    Component Value Date/Time   NA 136 02/28/2016 1022   NA 130 (L) 06/06/2014 1102   K 3.4 (L) 02/28/2016 1022   K 3.9 06/06/2014 1102   CL 103 02/28/2016 1022   CL 95 (L) 06/06/2014 1102   CO2 26 02/28/2016 1022   CO2 28 06/06/2014 1102   GLUCOSE 150 (H) 02/28/2016 1022   GLUCOSE 349 (H) 06/06/2014 1102   BUN 47 (H) 02/28/2016 1022   BUN 17 06/06/2014 1102   CREATININE 3.02 (H) 02/28/2016 1022   CREATININE 1.63 (H) 06/06/2014 1102   CALCIUM 9.1 02/28/2016 1022   CALCIUM 9.2 06/06/2014 1102   PROT 7.8 02/28/2016 1022   PROT 8.2 06/06/2014 1102   ALBUMIN 4.2 02/28/2016 1022   ALBUMIN 3.3 (L) 06/06/2014 1102   AST 16 02/28/2016 1022   AST 7 (L) 06/06/2014 1102   ALT 13 (L) 02/28/2016 1022   ALT 12 (L) 06/06/2014 1102   ALKPHOS 53 02/28/2016 1022   ALKPHOS 74 06/06/2014 1102   BILITOT 0.6 02/28/2016 1022   BILITOT 0.4 06/06/2014 1102   GFRNONAA 16 (L) 02/28/2016 1022   GFRNONAA 35 (L) 06/06/2014 1102   GFRAA 19 (L) 02/28/2016 1022   GFRAA 42 (L) 06/06/2014 1102    No results found for: SPEP, UPEP  Lab Results  Component Value Date   WBC 3.4 (L) 02/28/2016   NEUTROABS 1.9 02/28/2016   HGB 10.0 (L) 02/28/2016   HCT 28.2 (L) 02/28/2016   MCV 101.7 (H) 02/28/2016   PLT 196 02/28/2016      Chemistry      Component Value Date/Time   NA 136 02/28/2016 1022   NA 130 (L) 06/06/2014 1102   K 3.4 (L) 02/28/2016 1022   K 3.9 06/06/2014 1102   CL 103 02/28/2016 1022   CL 95 (L) 06/06/2014 1102   CO2 26 02/28/2016 1022   CO2 28 06/06/2014 1102   BUN 47 (H) 02/28/2016 1022   BUN 17 06/06/2014 1102   CREATININE  3.02 (H) 02/28/2016 1022   CREATININE 1.63 (H) 06/06/2014 1102      Component Value Date/Time   CALCIUM 9.1 02/28/2016 1022   CALCIUM 9.2 06/06/2014 1102   ALKPHOS 53 02/28/2016 1022   ALKPHOS 74 06/06/2014 1102   AST 16 02/28/2016 1022   AST 7 (L) 06/06/2014 1102   ALT 13 (L) 02/28/2016 1022   ALT 12 (L) 06/06/2014 1102   BILITOT 0.6 02/28/2016 1022   BILITOT 0.4 06/06/2014 1102        ASSESSMENT & PLAN:  Malignant neoplasm of upper-outer quadrant of female breast [facility]) # Left breast cancer stage IV/oligometastatic to the mediastinal lymph nodes currently  with ibrance and Femara since March 2016. clinically  NO evidence of progression. FEB 2017- PET- no progression/improved left breast mass/ stable-treated bone lesions. Order PET now.   # Patient will continue Femara with ibrance. Tolerating without any major side effects. CBC within normal limits except for hemoglobin 10. Patient will start ibrance on July 2nd..   # Significantly improved blood sugars- Today glucose is 64.   # Chronic kidney disease creatinine today improved at 2.83..   # Anemia likely from chronic kidney disease/iron deficiency saturation 7%- currently on by mouth iron once a day. Hemoglobin improved at 10.  # Metastatic disease to the bone-stable/ not on x-geva because of renal failure.  # We'll plan to get a CBC CMP prior to each cycle of treatment/follow-up with me in  4 weeks.  Breast cancer of upper-outer quadrant of left female breast [facility]) # Left breast cancer stage IV/oligometastatic to the mediastinal lymph nodes currently  with ibrance and Femara since March 2016. clinically  NO evidence of progression. FEB 2017- PET- no progression/improved left breast mass/ stable-treated bone lesions. We'll plan to get a PET scan  August 2017.  # Patient will continue Femara with ibrance. Tolerating without any major side effects. CBC within normal limits except for hemoglobin 10.  # Significantly improved  blood sugars-   # Chronic kidney disease creatinine today improved at 2.83..   # Anemia likely from chronic kidney disease/iron deficiency saturation 7%- currently on by mouth iron once a day. Hemoglobin improved at 10.  # Metastatic disease to the bone-stable/ not on x-geva because of renal failure.  # We'll plan to get a CBC CMP prior to each cycle of treatment/follow-up with me in 4 weeks/ PET scan in 3 weeks.       Cammie Sickle, MD 02/29/2016 7:10 PM

## 2016-02-28 NOTE — Assessment & Plan Note (Addendum)
#   Left breast cancer stage IV/oligometastatic to the mediastinal lymph nodes currently  with ibrance and Femara since March 2016. clinically  NO evidence of progression. FEB 2017- PET- no progression/improved left breast mass/ stable-treated bone lesions. We'll plan to get a PET scan  August 2017.  # Patient will continue Femara with ibrance. Tolerating without any major side effects. CBC within normal limits except for hemoglobin 10.  # Significantly improved blood sugars-   # Chronic kidney disease creatinine today improved at 2.83..   # Anemia likely from chronic kidney disease/iron deficiency saturation 7%- currently on by mouth iron once a day. Hemoglobin improved at 10.  # Metastatic disease to the bone-stable/ not on x-geva because of renal failure.  # We'll plan to get a CBC CMP prior to each cycle of treatment/follow-up with me in 4 weeks/ PET scan in 3 weeks.

## 2016-03-10 ENCOUNTER — Other Ambulatory Visit: Payer: Self-pay | Admitting: Internal Medicine

## 2016-03-11 ENCOUNTER — Inpatient Hospital Stay: Payer: Medicare Other | Attending: Internal Medicine

## 2016-03-11 DIAGNOSIS — F329 Major depressive disorder, single episode, unspecified: Secondary | ICD-10-CM | POA: Insufficient documentation

## 2016-03-11 DIAGNOSIS — Z7984 Long term (current) use of oral hypoglycemic drugs: Secondary | ICD-10-CM | POA: Diagnosis not present

## 2016-03-11 DIAGNOSIS — C50412 Malignant neoplasm of upper-outer quadrant of left female breast: Secondary | ICD-10-CM | POA: Diagnosis not present

## 2016-03-11 DIAGNOSIS — Z7982 Long term (current) use of aspirin: Secondary | ICD-10-CM | POA: Diagnosis not present

## 2016-03-11 DIAGNOSIS — Z8041 Family history of malignant neoplasm of ovary: Secondary | ICD-10-CM | POA: Insufficient documentation

## 2016-03-11 DIAGNOSIS — I129 Hypertensive chronic kidney disease with stage 1 through stage 4 chronic kidney disease, or unspecified chronic kidney disease: Secondary | ICD-10-CM | POA: Diagnosis not present

## 2016-03-11 DIAGNOSIS — Z170 Estrogen receptor positive status [ER+]: Secondary | ICD-10-CM | POA: Insufficient documentation

## 2016-03-11 DIAGNOSIS — Z794 Long term (current) use of insulin: Secondary | ICD-10-CM | POA: Diagnosis not present

## 2016-03-11 DIAGNOSIS — E669 Obesity, unspecified: Secondary | ICD-10-CM | POA: Insufficient documentation

## 2016-03-11 DIAGNOSIS — Z87891 Personal history of nicotine dependence: Secondary | ICD-10-CM | POA: Insufficient documentation

## 2016-03-11 DIAGNOSIS — Z8673 Personal history of transient ischemic attack (TIA), and cerebral infarction without residual deficits: Secondary | ICD-10-CM | POA: Insufficient documentation

## 2016-03-11 DIAGNOSIS — C7951 Secondary malignant neoplasm of bone: Secondary | ICD-10-CM | POA: Insufficient documentation

## 2016-03-11 DIAGNOSIS — J45909 Unspecified asthma, uncomplicated: Secondary | ICD-10-CM | POA: Diagnosis not present

## 2016-03-11 DIAGNOSIS — N183 Chronic kidney disease, stage 3 (moderate): Secondary | ICD-10-CM | POA: Insufficient documentation

## 2016-03-11 DIAGNOSIS — E1165 Type 2 diabetes mellitus with hyperglycemia: Secondary | ICD-10-CM | POA: Diagnosis not present

## 2016-03-11 DIAGNOSIS — Z79811 Long term (current) use of aromatase inhibitors: Secondary | ICD-10-CM | POA: Diagnosis not present

## 2016-03-11 DIAGNOSIS — Z452 Encounter for adjustment and management of vascular access device: Secondary | ICD-10-CM | POA: Insufficient documentation

## 2016-03-11 DIAGNOSIS — Z9221 Personal history of antineoplastic chemotherapy: Secondary | ICD-10-CM | POA: Insufficient documentation

## 2016-03-11 DIAGNOSIS — Z79899 Other long term (current) drug therapy: Secondary | ICD-10-CM | POA: Diagnosis not present

## 2016-03-11 DIAGNOSIS — Z95828 Presence of other vascular implants and grafts: Secondary | ICD-10-CM

## 2016-03-11 DIAGNOSIS — C779 Secondary and unspecified malignant neoplasm of lymph node, unspecified: Secondary | ICD-10-CM | POA: Diagnosis not present

## 2016-03-11 MED ORDER — HEPARIN SOD (PORK) LOCK FLUSH 100 UNIT/ML IV SOLN
500.0000 [IU] | Freq: Once | INTRAVENOUS | Status: AC
  Administered 2016-03-11: 500 [IU] via INTRAVENOUS

## 2016-03-11 MED ORDER — SODIUM CHLORIDE 0.9% FLUSH
10.0000 mL | INTRAVENOUS | Status: DC | PRN
  Administered 2016-03-11: 10 mL via INTRAVENOUS
  Filled 2016-03-11: qty 10

## 2016-03-12 ENCOUNTER — Inpatient Hospital Stay: Payer: Medicare Other

## 2016-03-24 ENCOUNTER — Ambulatory Visit
Admission: RE | Admit: 2016-03-24 | Discharge: 2016-03-24 | Disposition: A | Discharge status: Home / Self Care | Payer: Medicare Other | Source: Ambulatory Visit | Attending: Internal Medicine | Admitting: Internal Medicine

## 2016-03-24 DIAGNOSIS — M899 Disorder of bone, unspecified: Secondary | ICD-10-CM | POA: Insufficient documentation

## 2016-03-24 DIAGNOSIS — C50412 Malignant neoplasm of upper-outer quadrant of left female breast: Secondary | ICD-10-CM | POA: Insufficient documentation

## 2016-03-24 DIAGNOSIS — C50919 Malignant neoplasm of unspecified site of unspecified female breast: Secondary | ICD-10-CM | POA: Diagnosis not present

## 2016-03-24 LAB — GLUCOSE, CAPILLARY: Glucose-Capillary: 184 mg/dL — ABNORMAL HIGH (ref 65–99)

## 2016-03-24 MED ORDER — FLUDEOXYGLUCOSE F - 18 (FDG) INJECTION
13.1200 | Freq: Once | INTRAVENOUS | Status: AC
  Administered 2016-03-24: 13.12 via INTRAVENOUS

## 2016-03-27 ENCOUNTER — Ambulatory Visit: Payer: Medicare Other | Admitting: Internal Medicine

## 2016-03-27 ENCOUNTER — Other Ambulatory Visit: Payer: Medicare Other

## 2016-03-28 ENCOUNTER — Other Ambulatory Visit: Payer: Self-pay

## 2016-03-28 ENCOUNTER — Encounter: Payer: Self-pay | Admitting: Internal Medicine

## 2016-03-28 ENCOUNTER — Inpatient Hospital Stay: Payer: Medicare Other

## 2016-03-28 ENCOUNTER — Inpatient Hospital Stay (HOSPITAL_BASED_OUTPATIENT_CLINIC_OR_DEPARTMENT_OTHER): Payer: Medicare Other | Admitting: Internal Medicine

## 2016-03-28 VITALS — BP 153/96 | HR 71 | Temp 97.8°F | Resp 18 | Ht 62.0 in | Wt 195.8 lb

## 2016-03-28 DIAGNOSIS — Z794 Long term (current) use of insulin: Secondary | ICD-10-CM

## 2016-03-28 DIAGNOSIS — Z452 Encounter for adjustment and management of vascular access device: Secondary | ICD-10-CM

## 2016-03-28 DIAGNOSIS — J45909 Unspecified asthma, uncomplicated: Secondary | ICD-10-CM

## 2016-03-28 DIAGNOSIS — Z170 Estrogen receptor positive status [ER+]: Secondary | ICD-10-CM

## 2016-03-28 DIAGNOSIS — C50412 Malignant neoplasm of upper-outer quadrant of left female breast: Secondary | ICD-10-CM

## 2016-03-28 DIAGNOSIS — Z7982 Long term (current) use of aspirin: Secondary | ICD-10-CM

## 2016-03-28 DIAGNOSIS — N183 Chronic kidney disease, stage 3 (moderate): Secondary | ICD-10-CM

## 2016-03-28 DIAGNOSIS — Z87891 Personal history of nicotine dependence: Secondary | ICD-10-CM

## 2016-03-28 DIAGNOSIS — C50912 Malignant neoplasm of unspecified site of left female breast: Secondary | ICD-10-CM

## 2016-03-28 DIAGNOSIS — Z79899 Other long term (current) drug therapy: Secondary | ICD-10-CM

## 2016-03-28 DIAGNOSIS — C779 Secondary and unspecified malignant neoplasm of lymph node, unspecified: Secondary | ICD-10-CM | POA: Diagnosis not present

## 2016-03-28 DIAGNOSIS — E1165 Type 2 diabetes mellitus with hyperglycemia: Secondary | ICD-10-CM

## 2016-03-28 DIAGNOSIS — Z8673 Personal history of transient ischemic attack (TIA), and cerebral infarction without residual deficits: Secondary | ICD-10-CM

## 2016-03-28 DIAGNOSIS — Z79811 Long term (current) use of aromatase inhibitors: Secondary | ICD-10-CM | POA: Diagnosis not present

## 2016-03-28 DIAGNOSIS — E669 Obesity, unspecified: Secondary | ICD-10-CM

## 2016-03-28 DIAGNOSIS — Z7984 Long term (current) use of oral hypoglycemic drugs: Secondary | ICD-10-CM

## 2016-03-28 DIAGNOSIS — I129 Hypertensive chronic kidney disease with stage 1 through stage 4 chronic kidney disease, or unspecified chronic kidney disease: Secondary | ICD-10-CM

## 2016-03-28 DIAGNOSIS — Z9221 Personal history of antineoplastic chemotherapy: Secondary | ICD-10-CM

## 2016-03-28 DIAGNOSIS — F329 Major depressive disorder, single episode, unspecified: Secondary | ICD-10-CM

## 2016-03-28 DIAGNOSIS — Z8041 Family history of malignant neoplasm of ovary: Secondary | ICD-10-CM

## 2016-03-28 DIAGNOSIS — C7951 Secondary malignant neoplasm of bone: Secondary | ICD-10-CM | POA: Diagnosis not present

## 2016-03-28 LAB — CBC WITH DIFFERENTIAL/PLATELET
BASOS ABS: 0 10*3/uL (ref 0–0.1)
BASOS PCT: 1 %
EOS ABS: 0 10*3/uL (ref 0–0.7)
EOS PCT: 1 %
HCT: 28.1 % — ABNORMAL LOW (ref 35.0–47.0)
Hemoglobin: 10.1 g/dL — ABNORMAL LOW (ref 12.0–16.0)
Lymphocytes Relative: 28 %
Lymphs Abs: 1 10*3/uL (ref 1.0–3.6)
MCH: 36.9 pg — ABNORMAL HIGH (ref 26.0–34.0)
MCHC: 35.7 g/dL (ref 32.0–36.0)
MCV: 103.2 fL — ABNORMAL HIGH (ref 80.0–100.0)
Monocytes Absolute: 0.2 10*3/uL (ref 0.2–0.9)
Monocytes Relative: 5 %
Neutro Abs: 2.4 10*3/uL (ref 1.4–6.5)
Neutrophils Relative %: 65 %
PLATELETS: 219 10*3/uL (ref 150–440)
RBC: 2.73 MIL/uL — AB (ref 3.80–5.20)
RDW: 13.1 % (ref 11.5–14.5)
WBC: 3.7 10*3/uL (ref 3.6–11.0)

## 2016-03-28 LAB — COMPREHENSIVE METABOLIC PANEL
ALT: 11 U/L — AB (ref 14–54)
AST: 16 U/L (ref 15–41)
Albumin: 4.2 g/dL (ref 3.5–5.0)
Alkaline Phosphatase: 49 U/L (ref 38–126)
Anion gap: 5 (ref 5–15)
BUN: 40 mg/dL — AB (ref 6–20)
CHLORIDE: 104 mmol/L (ref 101–111)
CO2: 25 mmol/L (ref 22–32)
CREATININE: 3.06 mg/dL — AB (ref 0.44–1.00)
Calcium: 8.5 mg/dL — ABNORMAL LOW (ref 8.9–10.3)
GFR calc Af Amer: 18 mL/min — ABNORMAL LOW (ref >60)
GFR calc non Af Amer: 16 mL/min — ABNORMAL LOW (ref >60)
Glucose, Bld: 188 mg/dL — ABNORMAL HIGH (ref 65–99)
POTASSIUM: 3.7 mmol/L (ref 3.5–5.1)
SODIUM: 134 mmol/L — AB (ref 135–145)
Total Bilirubin: 0.5 mg/dL (ref 0.3–1.2)
Total Protein: 7.7 g/dL (ref 6.5–8.1)

## 2016-03-28 MED ORDER — LETROZOLE 2.5 MG PO TABS: 2.5000 mg | ORAL_TABLET | Freq: Every day | ORAL | 3 refills | Status: DC

## 2016-03-28 NOTE — Progress Notes (Signed)
[place] OFFICE PROGRESS NOTE  Patient Care Team: [name], [name]   SUMMARY OF ONCOLOGIC HISTORY:  Oncology History   # OCT 2015-STAGE IV LEFT BREAST T2N1 [T=4cm; [name]; [name]; [name]; EBUS- Positive Paratrac/subcarinal LN s/p ? Taxotere [in [place]; [name]; [name];[name]; [name]; suv 3]; [name]; [name]; [name].   # ? Bony lesions- PET sep 2016-non-hypermetabolic sclerotic lesions T10; Ant R iliac bone; inferior sternum- not on X-geva  # Poorly controlled Blood sugars- improved.   # Pancreatitis Hx/ CKD [creat ~ 2-3]     Malignant neoplasm of upper-outer quadrant of female breast ([place])   08/23/2015 Initial Diagnosis    Malignant neoplasm of upper-outer quadrant of female breast ([place])       Breast cancer of upper-outer quadrant of left female breast ([place])   02/28/2016 Initial Diagnosis    Breast cancer of upper-outer quadrant of left female breast ([place])        INTERVAL HISTORY:  [name] with above history of metastatic left breast cancer currently on second line therapy with ibrance plus Femara is here for follow-up.    Appetite good. Denies any weight loss. No bone pain. No nausea vomiting. Blood sugars are better controlled.  patient denies any unusual back pain or headaches.. Denies any nausea vomiting. Denies any diarrhea.  Denies any new lumps or bumps.    No cough or shortness of breath. No swelling in the legs.   REVIEW OF SYSTEMS:  A complete 10 point review of system is done which is negative except mentioned above/history of present illness.   PAST MEDICAL HISTORY :  Past Medical History:   Diagnosis Date  . Asthma   . CHF (congestive heart failure) ([place]) 1997  . CKD (chronic kidney disease)   . Depression   . Diabetes mellitus, type 2 ([place])   . Hair loss   . History of left breast cancer 05/29/14  . Obesity   . Pancreatitis 1997  . Stroke [facility]) 2010   with mild left arm weakness    PAST SURGICAL HISTORY :   Past Surgical History:  Procedure Laterality Date  . CESAREAN SECTION    . CHOLECYSTECTOMY      FAMILY HISTORY :   Family History  Problem Relation Age of Onset  . Ovarian cancer Mother   . Diabetes Mother   . Hypertension Mother   . COPD Father   . Hypertension Father   . Diabetes Sister   . Diabetes Brother     SOCIAL HISTORY:   Social History  Substance Use Topics  . Smoking status: Former Smoker    Packs/day: 0.50    Years: 1.00    Types: Cigarettes  . Smokeless tobacco: Never Used  . Alcohol use No    ALLERGIES:  has No Known Allergies.  MEDICATIONS:  Current Outpatient Prescriptions  Medication Sig Dispense Refill  . acetaminophen-codeine (TYLENOL #4) 300-60 MG tablet Take 2 tablets by mouth every 6 (six) hours as needed for moderate pain.     Marland Kitchen albuterol (PROAIR HFA) 108 (90 BASE) MCG/ACT inhaler Inhale 1 puff into the lungs every 6 (six) hours as needed for shortness of breath.     Marland Kitchen  albuterol (PROVENTIL) (2.5 MG/3ML) 0.083% nebulizer solution Inhale 2.5 mg into the lungs every 6 (six) hours as needed for shortness of breath.     . ALPRAZolam (XANAX) 0.5 MG tablet Take 0.5 mg by mouth at bedtime as needed for anxiety or sleep.     Marland Kitchen amLODipine (NORVASC) 10 MG tablet Take 10 mg by mouth daily.     Marland Kitchen aspirin EC 81 MG tablet Take 81 mg by mouth once.     Marland Kitchen atenolol (TENORMIN) 50 MG tablet TAKE 1 1/2 TABLETS BY MOUTH TWICE DAILY    . B-D ULTRA-FINE 33 LANCETS MISC Use 1 each 2 (two) times daily.    . B-D ULTRAFINE III SHORT PEN 31G X 8 MM MISC     . bumetanide (BUMEX) 0.5 MG tablet TAKE 1 TABLET BY MOUTH TWICE DAILY    . Cinnamon 500  MG capsule Take 500 mg by mouth daily.     . cloNIDine (CATAPRES) 0.2 MG tablet Take 0.2 mg by mouth daily.     . enalapril (VASOTEC) 20 MG tablet Take 20 mg by mouth daily.     . ferrous sulfate 325 (65 FE) MG tablet Take 325 mg by mouth 2 (two) times daily with a meal.    . FLUoxetine (PROZAC) 20 MG capsule Take 20 mg by mouth daily.     Marland Kitchen glucose blood (ONE TOUCH ULTRA TEST) test strip Use 1 each 2 (two) times daily. Use as instructed.    . glyBURIDE (DIABETA) 5 MG tablet Take 5 mg by mouth daily with breakfast.     . IBRANCE 125 MG capsule TAKE 1 CAPSULE BY MOUTH ONE TIME DAILY WITH BREAKFAST FOR 3 WEEKS ON FOLLOWED BY 1 WEEK OFF. TAKE WHOLE WITH FOOD. 21 capsule 6  . letrozole (FEMARA) 2.5 MG tablet Take 1 tablet (2.5 mg total) by mouth daily. 90 tablet 3  . LEVEMIR FLEXTOUCH 100 UNIT/ML Pen Inject 24 Units into the skin daily.     Marland Kitchen NOVOLOG FLEXPEN 100 UNIT/ML FlexPen     . salmeterol (SEREVENT) 50 MCG/DOSE diskus inhaler Inhale 1 puff into the lungs 2 (two) times daily.    . simvastatin (ZOCOR) 20 MG tablet Take 20 mg by mouth daily at 6 PM.     . vitamin B-12 (CYANOCOBALAMIN) 1000 MCG tablet Take 1,000 mcg by mouth daily.     No current facility-administered medications for this visit.    Facility-Administered Medications Ordered in Other Visits  Medication Dose Route Frequency Provider Last Rate Last Dose  . sodium chloride flush (NS) 0.9 % injection 10 mL  10 mL Intravenous PRN Cammie Sickle, MD   10 mL at 01/30/16 1054    PHYSICAL EXAMINATION: ECOG PERFORMANCE STATUS: 0 - Asymptomatic  BP (!) 153/96 (BP Location: Left Arm, Patient Position: Sitting)   Pulse 71   Temp 97.8 F (36.6 C) (Tympanic)   Resp 18   Ht '5\' 2"'$  (1.575 m)   Wt 195 lb 12.8 oz (88.8 kg)   BMI 35.81 kg/m   Filed Weights   03/28/16 1207  Weight: 195 lb 12.8 oz (88.8 kg)    GENERAL: Well-nourished well-developed; Alert, no distress and comfortable.  She is alone. EYES: no pallor or  icterus OROPHARYNX: no thrush or ulceration; good dentition  NECK: supple, no masses felt LYMPH:  no palpable lymphadenopathy in the cervical, axillary or inguinal regions LUNGS: clear to auscultation and  No wheeze or crackles HEART/CVS: regular rate & rhythm and no murmurs; No  lower extremity edema ABDOMEN:abdomen soft, non-tender and normal bowel sounds Musculoskeletal:no cyanosis of digits and no clubbing  PSYCH: alert & oriented x 3 with fluent speech NEURO: no focal motor/sensory deficits SKIN:  no rashes or significant lesions    LABORATORY DATA:  I have reviewed the data as listed    Component Value Date/Time   NA 134 (L) 03/28/2016 1125   NA 130 (L) 06/06/2014 1102   K 3.7 03/28/2016 1125   K 3.9 06/06/2014 1102   CL 104 03/28/2016 1125   CL 95 (L) 06/06/2014 1102   CO2 25 03/28/2016 1125   CO2 28 06/06/2014 1102   GLUCOSE 188 (H) 03/28/2016 1125   GLUCOSE 349 (H) 06/06/2014 1102   BUN 40 (H) 03/28/2016 1125   BUN 17 06/06/2014 1102   CREATININE 3.06 (H) 03/28/2016 1125   CREATININE 1.63 (H) 06/06/2014 1102   CALCIUM 8.5 (L) 03/28/2016 1125   CALCIUM 9.2 06/06/2014 1102   PROT 7.7 03/28/2016 1125   PROT 8.2 06/06/2014 1102   ALBUMIN 4.2 03/28/2016 1125   ALBUMIN 3.3 (L) 06/06/2014 1102   AST 16 03/28/2016 1125   AST 7 (L) 06/06/2014 1102   ALT 11 (L) 03/28/2016 1125   ALT 12 (L) 06/06/2014 1102   ALKPHOS 49 03/28/2016 1125   ALKPHOS 74 06/06/2014 1102   BILITOT 0.5 03/28/2016 1125   BILITOT 0.4 06/06/2014 1102   GFRNONAA 16 (L) 03/28/2016 1125   GFRNONAA 35 (L) 06/06/2014 1102   GFRAA 18 (L) 03/28/2016 1125   GFRAA 42 (L) 06/06/2014 1102    No results found for: SPEP, UPEP  Lab Results  Component Value Date   WBC 3.7 03/28/2016   NEUTROABS 2.4 03/28/2016   HGB 10.1 (L) 03/28/2016   HCT 28.1 (L) 03/28/2016   MCV 103.2 (H) 03/28/2016   PLT 219 03/28/2016      Chemistry      Component Value Date/Time   NA 134 (L) 03/28/2016 1125   NA 130 (L)  06/06/2014 1102   K 3.7 03/28/2016 1125   K 3.9 06/06/2014 1102   CL 104 03/28/2016 1125   CL 95 (L) 06/06/2014 1102   CO2 25 03/28/2016 1125   CO2 28 06/06/2014 1102   BUN 40 (H) 03/28/2016 1125   BUN 17 06/06/2014 1102   CREATININE 3.06 (H) 03/28/2016 1125   CREATININE 1.63 (H) 06/06/2014 1102      Component Value Date/Time   CALCIUM 8.5 (L) 03/28/2016 1125   CALCIUM 9.2 06/06/2014 1102   ALKPHOS 49 03/28/2016 1125   ALKPHOS 74 06/06/2014 1102   AST 16 03/28/2016 1125   AST 7 (L) 06/06/2014 1102   ALT 11 (L) 03/28/2016 1125   ALT 12 (L) 06/06/2014 1102   BILITOT 0.5 03/28/2016 1125   BILITOT 0.4 06/06/2014 1102     IMPRESSION: 1. Minimal metabolic activity associated the small LEFT breast mass unchanged from comparison exam. 2. No evidence of hypermetabolic metastatic adenopathy. 3. Sclerotic bone lesions without metabolic activity, unchanged from prior.   Electronically Signed   By: Suzy Bouchard M.D.   On: 03/24/2016 11:22   ASSESSMENT & PLAN:  Breast cancer of upper-outer quadrant of left female breast [facility]) # Left breast cancer stage IV/oligometastatic to the mediastinal lymph nodes currently  with ibrance and Femara since March 2016. Clinically  NO evidence of progression. AUG 2017- PET- no progression/improved left breast mass/ stable-treated bone lesions.   # Patient will continue Femara with ibrance. Tolerating without any major side  effects. CBC within normal limits except for hemoglobin 10.  # Chronic kidney disease creatinine stable 3. Follows up with Dr. Juleen China  # Anemia likely from chronic kidney disease/iron deficiency saturation 7%-  PO  mouth iron once a day. Hemoglobin improved at 10.  # Metastatic disease to the bone-stable/ not on x-geva because of renal failure.  # Follow with labs to marker 4 weeks/MD.       Cammie Sickle, MD 03/28/2016 5:21 PM

## 2016-03-28 NOTE — Progress Notes (Signed)
Pt here for PET results

## 2016-03-28 NOTE — Assessment & Plan Note (Addendum)
#   Left breast cancer stage IV/oligometastatic to the mediastinal lymph nodes currently  with ibrance and Femara since March 2016. Clinically  NO evidence of progression. AUG 2017- PET- no progression/improved left breast mass/ stable-treated bone lesions.   # Patient will continue Femara with ibrance. Tolerating without any major side effects. CBC within normal limits except for hemoglobin 10.  # Chronic kidney disease creatinine stable 3. Follows up with Dr. Juleen China  # Anemia likely from chronic kidney disease/iron deficiency saturation 7%-  PO  mouth iron once a day. Hemoglobin improved at 10.  # Metastatic disease to the bone-stable/ not on x-geva because of renal failure.  # Follow with labs to marker 4 weeks/MD.

## 2016-03-29 LAB — CANCER ANTIGEN 27.29: CA 27.29: 40.7 U/mL — ABNORMAL HIGH (ref 0.0–38.6)

## 2016-03-31 DIAGNOSIS — I129 Hypertensive chronic kidney disease with stage 1 through stage 4 chronic kidney disease, or unspecified chronic kidney disease: Secondary | ICD-10-CM | POA: Diagnosis not present

## 2016-03-31 DIAGNOSIS — R809 Proteinuria, unspecified: Secondary | ICD-10-CM | POA: Diagnosis not present

## 2016-03-31 DIAGNOSIS — N2581 Secondary hyperparathyroidism of renal origin: Secondary | ICD-10-CM | POA: Diagnosis not present

## 2016-03-31 DIAGNOSIS — N184 Chronic kidney disease, stage 4 (severe): Secondary | ICD-10-CM | POA: Diagnosis not present

## 2016-03-31 DIAGNOSIS — E1122 Type 2 diabetes mellitus with diabetic chronic kidney disease: Secondary | ICD-10-CM | POA: Diagnosis not present

## 2016-04-25 ENCOUNTER — Inpatient Hospital Stay: Payer: Medicare Other

## 2016-04-25 ENCOUNTER — Inpatient Hospital Stay: Payer: Medicare Other | Attending: Internal Medicine | Admitting: Internal Medicine

## 2016-04-25 ENCOUNTER — Encounter: Payer: Self-pay | Admitting: Internal Medicine

## 2016-04-25 VITALS — BP 127/84 | HR 67 | Temp 96.6°F | Resp 17 | Ht 62.0 in | Wt 197.0 lb

## 2016-04-25 DIAGNOSIS — Z8041 Family history of malignant neoplasm of ovary: Secondary | ICD-10-CM | POA: Diagnosis not present

## 2016-04-25 DIAGNOSIS — E1165 Type 2 diabetes mellitus with hyperglycemia: Secondary | ICD-10-CM | POA: Diagnosis not present

## 2016-04-25 DIAGNOSIS — K859 Acute pancreatitis without necrosis or infection, unspecified: Secondary | ICD-10-CM | POA: Diagnosis not present

## 2016-04-25 DIAGNOSIS — E1122 Type 2 diabetes mellitus with diabetic chronic kidney disease: Secondary | ICD-10-CM | POA: Insufficient documentation

## 2016-04-25 DIAGNOSIS — Z8673 Personal history of transient ischemic attack (TIA), and cerebral infarction without residual deficits: Secondary | ICD-10-CM | POA: Insufficient documentation

## 2016-04-25 DIAGNOSIS — I509 Heart failure, unspecified: Secondary | ICD-10-CM | POA: Insufficient documentation

## 2016-04-25 DIAGNOSIS — C50412 Malignant neoplasm of upper-outer quadrant of left female breast: Secondary | ICD-10-CM

## 2016-04-25 DIAGNOSIS — Z7982 Long term (current) use of aspirin: Secondary | ICD-10-CM | POA: Diagnosis not present

## 2016-04-25 DIAGNOSIS — Z95828 Presence of other vascular implants and grafts: Secondary | ICD-10-CM

## 2016-04-25 DIAGNOSIS — Z170 Estrogen receptor positive status [ER+]: Secondary | ICD-10-CM | POA: Diagnosis not present

## 2016-04-25 DIAGNOSIS — F329 Major depressive disorder, single episode, unspecified: Secondary | ICD-10-CM | POA: Insufficient documentation

## 2016-04-25 DIAGNOSIS — Z79811 Long term (current) use of aromatase inhibitors: Secondary | ICD-10-CM | POA: Insufficient documentation

## 2016-04-25 DIAGNOSIS — Z79899 Other long term (current) drug therapy: Secondary | ICD-10-CM | POA: Diagnosis not present

## 2016-04-25 DIAGNOSIS — Z87891 Personal history of nicotine dependence: Secondary | ICD-10-CM | POA: Diagnosis not present

## 2016-04-25 DIAGNOSIS — E669 Obesity, unspecified: Secondary | ICD-10-CM | POA: Insufficient documentation

## 2016-04-25 DIAGNOSIS — J45909 Unspecified asthma, uncomplicated: Secondary | ICD-10-CM | POA: Diagnosis not present

## 2016-04-25 DIAGNOSIS — N189 Chronic kidney disease, unspecified: Secondary | ICD-10-CM | POA: Insufficient documentation

## 2016-04-25 DIAGNOSIS — Z794 Long term (current) use of insulin: Secondary | ICD-10-CM | POA: Insufficient documentation

## 2016-04-25 LAB — CBC WITH DIFFERENTIAL/PLATELET
BASOS ABS: 0.1 10*3/uL (ref 0–0.1)
BASOS PCT: 2 %
Eosinophils Absolute: 0 10*3/uL (ref 0–0.7)
Eosinophils Relative: 1 %
HEMATOCRIT: 28.2 % — AB (ref 35.0–47.0)
HEMOGLOBIN: 9.9 g/dL — AB (ref 12.0–16.0)
Lymphocytes Relative: 30 %
Lymphs Abs: 1.1 10*3/uL (ref 1.0–3.6)
MCH: 35.7 pg — ABNORMAL HIGH (ref 26.0–34.0)
MCHC: 35 g/dL (ref 32.0–36.0)
MCV: 102 fL — ABNORMAL HIGH (ref 80.0–100.0)
Monocytes Absolute: 0.2 10*3/uL (ref 0.2–0.9)
Monocytes Relative: 6 %
NEUTROS ABS: 2.3 10*3/uL (ref 1.4–6.5)
NEUTROS PCT: 61 %
Platelets: 267 10*3/uL (ref 150–440)
RBC: 2.76 MIL/uL — AB (ref 3.80–5.20)
RDW: 13 % (ref 11.5–14.5)
WBC: 3.7 10*3/uL (ref 3.6–11.0)

## 2016-04-25 LAB — COMPREHENSIVE METABOLIC PANEL
ALBUMIN: 4 g/dL (ref 3.5–5.0)
ALK PHOS: 53 U/L (ref 38–126)
ALT: 14 U/L (ref 14–54)
AST: 18 U/L (ref 15–41)
Anion gap: 7 (ref 5–15)
BILIRUBIN TOTAL: 0.7 mg/dL (ref 0.3–1.2)
BUN: 34 mg/dL — AB (ref 6–20)
CALCIUM: 9.1 mg/dL (ref 8.9–10.3)
CO2: 26 mmol/L (ref 22–32)
Chloride: 104 mmol/L (ref 101–111)
Creatinine, Ser: 3.43 mg/dL — ABNORMAL HIGH (ref 0.44–1.00)
GFR calc Af Amer: 16 mL/min — ABNORMAL LOW (ref >60)
GFR calc non Af Amer: 14 mL/min — ABNORMAL LOW (ref >60)
GLUCOSE: 124 mg/dL — AB (ref 65–99)
Potassium: 3.8 mmol/L (ref 3.5–5.1)
Sodium: 137 mmol/L (ref 135–145)
TOTAL PROTEIN: 7.4 g/dL (ref 6.5–8.1)

## 2016-04-25 MED ORDER — SODIUM CHLORIDE 0.9% FLUSH
10.0000 mL | INTRAVENOUS | Status: DC | PRN
  Administered 2016-04-25: 10 mL via INTRAVENOUS
  Filled 2016-04-25: qty 10

## 2016-04-25 MED ORDER — HEPARIN SOD (PORK) LOCK FLUSH 100 UNIT/ML IV SOLN
500.0000 [IU] | Freq: Once | INTRAVENOUS | Status: AC
  Administered 2016-04-25: 500 [IU] via INTRAVENOUS

## 2016-04-25 NOTE — Progress Notes (Signed)
No concerns no changes

## 2016-04-25 NOTE — Assessment & Plan Note (Addendum)
#   Left breast cancer stage IV/oligometastatic to the mediastinal lymph nodes currently  with ibrance and Femara since March 2016. Clinically  NO evidence of progression. AUG 2017- PET- no progression/improved left breast mass/ stable-treated bone lesions.   # Patient will continue Femara with ibrance. Tolerating without any major side effects. CBC within normal limits except for hemoglobin 9.9. Clinically no evidence of progression.  # Anemia likely from chronic kidney disease/iron deficiency saturation 7%-  PO  mouth iron once a day. Stable.   # wants tooth pulled- check CBC prior; okay for flu shot.   # Metastatic disease to the bone-stable/ not on x-geva because of renal failure/ # Chronic kidney disease creatinine stable 3 .4. Follows up with Dr. Juleen China  # Follow with labs to marker 4 weeks/MD.

## 2016-04-25 NOTE — Progress Notes (Signed)
[place] OFFICE PROGRESS NOTE  Patient Care Team: [name], [name]   SUMMARY OF ONCOLOGIC HISTORY:  Oncology History   # OCT 2015-STAGE IV LEFT BREAST T2N1 [T=4cm; [name]; [name]; [name]; EBUS- Positive Paratrac/subcarinal LN s/p ? Taxotere [in [place]; [name]; [name];[name]; [name]; suv 3]; [name]; [name]; [name].   # ? Bony lesions- PET sep 2016-non-hypermetabolic sclerotic lesions T10; Ant R iliac bone; inferior sternum- not on X-geva  # Poorly controlled Blood sugars- improved.   # Pancreatitis Hx/ CKD [creat ~ 2-3]     Malignant neoplasm of upper-outer quadrant of female breast ([place])   08/23/2015 Initial Diagnosis    Malignant neoplasm of upper-outer quadrant of female breast ([place])       Breast cancer of upper-outer quadrant of left female breast ([place])   02/28/2016 Initial Diagnosis    Breast cancer of upper-outer quadrant of left female breast ([place])        INTERVAL HISTORY:  [name] with above history of metastatic left breast cancer currently on second line therapy with ibrance plus Femara is here for follow-up.    No unusual aches and pains. Appetite good. Denies any weight loss. No bone pain. No nausea vomiting. Blood sugars are better controlled.  patient denies any unusual back pain or headaches.. Denies any nausea vomiting. Denies any diarrhea.  Denies any new lumps or bumps.   No cough or shortness of breath. No swelling in the legs.  The patient's blood sugars are better controlled.   REVIEW OF SYSTEMS:  A complete 10 point review of system is done which is negative except mentioned  above/history of present illness.   PAST MEDICAL HISTORY :  Past Medical History:  Diagnosis Date  . Asthma   . CHF (congestive heart failure) ([place]) 1997  . CKD (chronic kidney disease)   . Depression   . Diabetes mellitus, type 2 ([place])   . Hair loss   . History of left breast cancer 05/29/14  . Obesity   . Pancreatitis 1997  . Stroke [facility]) 2010   with mild left arm weakness    PAST SURGICAL HISTORY :   Past Surgical History:  Procedure Laterality Date  . CESAREAN SECTION    . CHOLECYSTECTOMY      FAMILY HISTORY :   Family History  Problem Relation Age of Onset  . Ovarian cancer Mother   . Diabetes Mother   . Hypertension Mother   . COPD Father   . Hypertension Father   . Diabetes Sister   . Diabetes Brother     SOCIAL HISTORY:   Social History  Substance Use Topics  . Smoking status: Former Smoker    Packs/day: 0.50    Years: 1.00    Types: Cigarettes  . Smokeless tobacco: Never Used  . Alcohol use No    ALLERGIES:  has No Known Allergies.  MEDICATIONS:  Current Outpatient Prescriptions  Medication Sig Dispense Refill  . acetaminophen-codeine (TYLENOL #4) 300-60 MG tablet Take 2 tablets by mouth every 6 (six) hours as needed for moderate pain.     Marland Kitchen albuterol (PROAIR HFA) 108 (90 BASE) MCG/ACT inhaler Inhale 1 puff into the lungs every 6 (  six) hours as needed for shortness of breath.     Marland Kitchen albuterol (PROVENTIL) (2.5 MG/3ML) 0.083% nebulizer solution Inhale 2.5 mg into the lungs every 6 (six) hours as needed for shortness of breath.     . ALPRAZolam (XANAX) 0.5 MG tablet Take 0.5 mg by mouth at bedtime as needed for anxiety or sleep.     Marland Kitchen amLODipine (NORVASC) 10 MG tablet Take 10 mg by mouth daily.     Marland Kitchen aspirin EC 81 MG tablet Take 81 mg by mouth once.     Marland Kitchen atenolol (TENORMIN) 50 MG tablet TAKE 1 1/2 TABLETS BY MOUTH TWICE DAILY    . B-D ULTRA-FINE 33 LANCETS MISC Use 1 each 2 (two) times daily.    . B-D ULTRAFINE III SHORT PEN 31G X 8 MM MISC     .  bumetanide (BUMEX) 0.5 MG tablet TAKE 1 TABLET BY MOUTH TWICE DAILY    . Cinnamon 500 MG capsule Take 500 mg by mouth daily.     . cloNIDine (CATAPRES) 0.2 MG tablet Take 0.2 mg by mouth daily.     . enalapril (VASOTEC) 20 MG tablet Take 20 mg by mouth daily.     . ferrous sulfate 325 (65 FE) MG tablet Take 325 mg by mouth 2 (two) times daily with a meal.    . FLUoxetine (PROZAC) 20 MG capsule Take 20 mg by mouth daily.     Marland Kitchen glucose blood (ONE TOUCH ULTRA TEST) test strip Use 1 each 2 (two) times daily. Use as instructed.    . glyBURIDE (DIABETA) 5 MG tablet Take 5 mg by mouth daily with breakfast.     . IBRANCE 125 MG capsule TAKE 1 CAPSULE BY MOUTH ONE TIME DAILY WITH BREAKFAST FOR 3 WEEKS ON FOLLOWED BY 1 WEEK OFF. TAKE WHOLE WITH FOOD. 21 capsule 6  . letrozole (FEMARA) 2.5 MG tablet Take 1 tablet (2.5 mg total) by mouth daily. 90 tablet 3  . LEVEMIR FLEXTOUCH 100 UNIT/ML Pen Inject 24 Units into the skin daily.     Marland Kitchen NOVOLOG FLEXPEN 100 UNIT/ML FlexPen     . salmeterol (SEREVENT) 50 MCG/DOSE diskus inhaler Inhale 1 puff into the lungs 2 (two) times daily.    . simvastatin (ZOCOR) 20 MG tablet Take 20 mg by mouth daily at 6 PM.     . vitamin B-12 (CYANOCOBALAMIN) 1000 MCG tablet Take 1,000 mcg by mouth daily.     No current facility-administered medications for this visit.    Facility-Administered Medications Ordered in Other Visits  Medication Dose Route Frequency Provider Last Rate Last Dose  . sodium chloride flush (NS) 0.9 % injection 10 mL  10 mL Intravenous PRN Cammie Sickle, MD   10 mL at 01/30/16 1054    PHYSICAL EXAMINATION: ECOG PERFORMANCE STATUS: 0 - Asymptomatic  BP 127/84 (BP Location: Right Arm, Patient Position: Sitting)   Pulse 67   Temp (!) 96.6 F (35.9 C)   Resp 17   Ht '5\' 2"'$  (1.575 m)   Wt 197 lb (89.4 kg)   BMI 36.03 kg/m   Filed Weights   04/25/16 1108  Weight: 197 lb (89.4 kg)    GENERAL: Well-nourished well-developed; Alert, no distress  and comfortable.  She is alone. EYES: no pallor or icterus OROPHARYNX: no thrush or ulceration; good dentition  NECK: supple, no masses felt LYMPH:  no palpable lymphadenopathy in the cervical, axillary or inguinal regions LUNGS: clear to auscultation and  No wheeze or crackles HEART/CVS:  regular rate & rhythm and no murmurs; No lower extremity edema ABDOMEN:abdomen soft, non-tender and normal bowel sounds Musculoskeletal:no cyanosis of digits and no clubbing  PSYCH: alert & oriented x 3 with fluent speech NEURO: no focal motor/sensory deficits SKIN:  no rashes or significant lesions    LABORATORY DATA:  I have reviewed the data as listed    Component Value Date/Time   NA 137 04/25/2016 1043   NA 130 (L) 06/06/2014 1102   K 3.8 04/25/2016 1043   K 3.9 06/06/2014 1102   CL 104 04/25/2016 1043   CL 95 (L) 06/06/2014 1102   CO2 26 04/25/2016 1043   CO2 28 06/06/2014 1102   GLUCOSE 124 (H) 04/25/2016 1043   GLUCOSE 349 (H) 06/06/2014 1102   BUN 34 (H) 04/25/2016 1043   BUN 17 06/06/2014 1102   CREATININE 3.43 (H) 04/25/2016 1043   CREATININE 1.63 (H) 06/06/2014 1102   CALCIUM 9.1 04/25/2016 1043   CALCIUM 9.2 06/06/2014 1102   PROT 7.4 04/25/2016 1043   PROT 8.2 06/06/2014 1102   ALBUMIN 4.0 04/25/2016 1043   ALBUMIN 3.3 (L) 06/06/2014 1102   AST 18 04/25/2016 1043   AST 7 (L) 06/06/2014 1102   ALT 14 04/25/2016 1043   ALT 12 (L) 06/06/2014 1102   ALKPHOS 53 04/25/2016 1043   ALKPHOS 74 06/06/2014 1102   BILITOT 0.7 04/25/2016 1043   BILITOT 0.4 06/06/2014 1102   GFRNONAA 14 (L) 04/25/2016 1043   GFRNONAA 35 (L) 06/06/2014 1102   GFRAA 16 (L) 04/25/2016 1043   GFRAA 42 (L) 06/06/2014 1102    No results found for: SPEP, UPEP  Lab Results  Component Value Date   WBC 3.7 04/25/2016   NEUTROABS 2.3 04/25/2016   HGB 9.9 (L) 04/25/2016   HCT 28.2 (L) 04/25/2016   MCV 102.0 (H) 04/25/2016   PLT 267 04/25/2016      Chemistry      Component Value Date/Time   NA  137 04/25/2016 1043   NA 130 (L) 06/06/2014 1102   K 3.8 04/25/2016 1043   K 3.9 06/06/2014 1102   CL 104 04/25/2016 1043   CL 95 (L) 06/06/2014 1102   CO2 26 04/25/2016 1043   CO2 28 06/06/2014 1102   BUN 34 (H) 04/25/2016 1043   BUN 17 06/06/2014 1102   CREATININE 3.43 (H) 04/25/2016 1043   CREATININE 1.63 (H) 06/06/2014 1102      Component Value Date/Time   CALCIUM 9.1 04/25/2016 1043   CALCIUM 9.2 06/06/2014 1102   ALKPHOS 53 04/25/2016 1043   ALKPHOS 74 06/06/2014 1102   AST 18 04/25/2016 1043   AST 7 (L) 06/06/2014 1102   ALT 14 04/25/2016 1043   ALT 12 (L) 06/06/2014 1102   BILITOT 0.7 04/25/2016 1043   BILITOT 0.4 06/06/2014 1102     IMPRESSION: 1. Minimal metabolic activity associated the small LEFT breast mass unchanged from comparison exam. 2. No evidence of hypermetabolic metastatic adenopathy. 3. Sclerotic bone lesions without metabolic activity, unchanged from prior.   Electronically Signed   By: Suzy Bouchard M.D.   On: 03/24/2016 11:22   ASSESSMENT & PLAN:  Breast cancer of upper-outer quadrant of left female breast [place]) # Left breast cancer stage IV/oligometastatic to the mediastinal lymph nodes currently  with ibrance and Femara since March 2016. Clinically  NO evidence of progression. AUG 2017- PET- no progression/improved left breast mass/ stable-treated bone lesions.   # Patient will continue Femara with ibrance. Tolerating without any  major side effects. CBC within normal limits except for hemoglobin 9.9. Clinically no evidence of progression.  # Anemia likely from chronic kidney disease/iron deficiency saturation 7%-  PO  mouth iron once a day. Stable.   # wants tooth pulled- check CBC prior; okay for flu shot.   # Metastatic disease to the bone-stable/ not on x-geva because of renal failure/ # Chronic kidney disease creatinine stable 3 .4. Follows up with Dr. Juleen China  # Follow with labs to marker 4 weeks/MD.       Cammie Sickle, MD 04/25/2016 5:05 PM

## 2016-04-28 ENCOUNTER — Other Ambulatory Visit: Payer: Self-pay

## 2016-04-28 DIAGNOSIS — C50912 Malignant neoplasm of unspecified site of left female breast: Secondary | ICD-10-CM

## 2016-04-28 DIAGNOSIS — Z23 Encounter for immunization: Secondary | ICD-10-CM | POA: Diagnosis not present

## 2016-05-20 DIAGNOSIS — E119 Type 2 diabetes mellitus without complications: Secondary | ICD-10-CM | POA: Diagnosis not present

## 2016-05-20 DIAGNOSIS — N184 Chronic kidney disease, stage 4 (severe): Secondary | ICD-10-CM | POA: Diagnosis not present

## 2016-05-20 DIAGNOSIS — J4520 Mild intermittent asthma, uncomplicated: Secondary | ICD-10-CM | POA: Diagnosis not present

## 2016-05-20 DIAGNOSIS — C50812 Malignant neoplasm of overlapping sites of left female breast: Secondary | ICD-10-CM | POA: Diagnosis not present

## 2016-05-20 DIAGNOSIS — I5022 Chronic systolic (congestive) heart failure: Secondary | ICD-10-CM | POA: Diagnosis not present

## 2016-05-20 DIAGNOSIS — E782 Mixed hyperlipidemia: Secondary | ICD-10-CM | POA: Diagnosis not present

## 2016-05-20 DIAGNOSIS — Z794 Long term (current) use of insulin: Secondary | ICD-10-CM | POA: Diagnosis not present

## 2016-05-20 DIAGNOSIS — I10 Essential (primary) hypertension: Secondary | ICD-10-CM | POA: Diagnosis not present

## 2016-05-23 ENCOUNTER — Inpatient Hospital Stay (HOSPITAL_BASED_OUTPATIENT_CLINIC_OR_DEPARTMENT_OTHER): Payer: Medicare Other | Admitting: Internal Medicine

## 2016-05-23 ENCOUNTER — Inpatient Hospital Stay: Payer: Medicare Other | Attending: Internal Medicine

## 2016-05-23 DIAGNOSIS — Z87891 Personal history of nicotine dependence: Secondary | ICD-10-CM | POA: Insufficient documentation

## 2016-05-23 DIAGNOSIS — C778 Secondary and unspecified malignant neoplasm of lymph nodes of multiple regions: Secondary | ICD-10-CM | POA: Insufficient documentation

## 2016-05-23 DIAGNOSIS — Z7982 Long term (current) use of aspirin: Secondary | ICD-10-CM

## 2016-05-23 DIAGNOSIS — D649 Anemia, unspecified: Secondary | ICD-10-CM

## 2016-05-23 DIAGNOSIS — Z79899 Other long term (current) drug therapy: Secondary | ICD-10-CM | POA: Insufficient documentation

## 2016-05-23 DIAGNOSIS — Z170 Estrogen receptor positive status [ER+]: Secondary | ICD-10-CM | POA: Diagnosis not present

## 2016-05-23 DIAGNOSIS — Z79811 Long term (current) use of aromatase inhibitors: Secondary | ICD-10-CM

## 2016-05-23 DIAGNOSIS — Z853 Personal history of malignant neoplasm of breast: Secondary | ICD-10-CM

## 2016-05-23 DIAGNOSIS — C50212 Malignant neoplasm of upper-inner quadrant of left female breast: Secondary | ICD-10-CM | POA: Insufficient documentation

## 2016-05-23 DIAGNOSIS — Z794 Long term (current) use of insulin: Secondary | ICD-10-CM | POA: Insufficient documentation

## 2016-05-23 DIAGNOSIS — E669 Obesity, unspecified: Secondary | ICD-10-CM | POA: Insufficient documentation

## 2016-05-23 DIAGNOSIS — I509 Heart failure, unspecified: Secondary | ICD-10-CM

## 2016-05-23 DIAGNOSIS — C7951 Secondary malignant neoplasm of bone: Secondary | ICD-10-CM

## 2016-05-23 DIAGNOSIS — I129 Hypertensive chronic kidney disease with stage 1 through stage 4 chronic kidney disease, or unspecified chronic kidney disease: Secondary | ICD-10-CM | POA: Insufficient documentation

## 2016-05-23 DIAGNOSIS — N189 Chronic kidney disease, unspecified: Secondary | ICD-10-CM

## 2016-05-23 DIAGNOSIS — E1165 Type 2 diabetes mellitus with hyperglycemia: Secondary | ICD-10-CM | POA: Insufficient documentation

## 2016-05-23 DIAGNOSIS — F329 Major depressive disorder, single episode, unspecified: Secondary | ICD-10-CM

## 2016-05-23 DIAGNOSIS — C50912 Malignant neoplasm of unspecified site of left female breast: Secondary | ICD-10-CM

## 2016-05-23 DIAGNOSIS — Z8673 Personal history of transient ischemic attack (TIA), and cerebral infarction without residual deficits: Secondary | ICD-10-CM

## 2016-05-23 DIAGNOSIS — Z8041 Family history of malignant neoplasm of ovary: Secondary | ICD-10-CM | POA: Insufficient documentation

## 2016-05-23 DIAGNOSIS — J45909 Unspecified asthma, uncomplicated: Secondary | ICD-10-CM | POA: Diagnosis not present

## 2016-05-23 LAB — CBC WITH DIFFERENTIAL/PLATELET
BASOS ABS: 0.1 10*3/uL (ref 0–0.1)
Basophils Relative: 1 %
EOS PCT: 1 %
Eosinophils Absolute: 0 10*3/uL (ref 0–0.7)
HEMATOCRIT: 27.5 % — AB (ref 35.0–47.0)
Hemoglobin: 9.7 g/dL — ABNORMAL LOW (ref 12.0–16.0)
LYMPHS ABS: 1 10*3/uL (ref 1.0–3.6)
LYMPHS PCT: 22 %
MCH: 36.2 pg — AB (ref 26.0–34.0)
MCHC: 35.3 g/dL (ref 32.0–36.0)
MCV: 102.4 fL — AB (ref 80.0–100.0)
MONO ABS: 0.2 10*3/uL (ref 0.2–0.9)
MONOS PCT: 6 %
NEUTROS ABS: 3 10*3/uL (ref 1.4–6.5)
Neutrophils Relative %: 70 %
Platelets: 339 10*3/uL (ref 150–440)
RBC: 2.69 MIL/uL — ABNORMAL LOW (ref 3.80–5.20)
RDW: 12.8 % (ref 11.5–14.5)
WBC: 4.3 10*3/uL (ref 3.6–11.0)

## 2016-05-23 LAB — COMPREHENSIVE METABOLIC PANEL
ALT: 13 U/L — ABNORMAL LOW (ref 14–54)
AST: 18 U/L (ref 15–41)
Albumin: 4.1 g/dL (ref 3.5–5.0)
Alkaline Phosphatase: 58 U/L (ref 38–126)
Anion gap: 9 (ref 5–15)
BILIRUBIN TOTAL: 0.7 mg/dL (ref 0.3–1.2)
BUN: 43 mg/dL — AB (ref 6–20)
CO2: 22 mmol/L (ref 22–32)
CREATININE: 3.45 mg/dL — AB (ref 0.44–1.00)
Calcium: 9.2 mg/dL (ref 8.9–10.3)
Chloride: 104 mmol/L (ref 101–111)
GFR, EST AFRICAN AMERICAN: 16 mL/min — AB (ref >60)
GFR, EST NON AFRICAN AMERICAN: 14 mL/min — AB (ref >60)
Glucose, Bld: 121 mg/dL — ABNORMAL HIGH (ref 65–99)
POTASSIUM: 3.7 mmol/L (ref 3.5–5.1)
Sodium: 135 mmol/L (ref 135–145)
TOTAL PROTEIN: 7.8 g/dL (ref 6.5–8.1)

## 2016-05-23 NOTE — Progress Notes (Signed)
[place] OFFICE PROGRESS NOTE  Patient Care Team: [name], [name]   SUMMARY OF ONCOLOGIC HISTORY:  Oncology History   # OCT 2015-STAGE IV LEFT BREAST T2N1 [T=4cm; [name]; [name]; [name]; EBUS- Positive Paratrac/subcarinal LN s/p ? Taxotere [in [place]; [name]; [name];[name]; [name]; suv 3]; [name]; [name]; [name].   # ? Bony lesions- PET sep 2016-non-hypermetabolic sclerotic lesions T10; Ant R iliac bone; inferior sternum- not on X-geva  # Poorly controlled Blood sugars- improved.   # Pancreatitis Hx/ CKD [creat ~ 2-3]     Malignant neoplasm of upper-outer quadrant of female breast ([place])   08/23/2015 Initial Diagnosis    Malignant neoplasm of upper-outer quadrant of female breast ([place])       Breast cancer of upper-outer quadrant of left female breast ([place])   02/28/2016 Initial Diagnosis    Breast cancer of upper-outer quadrant of left female breast ([place])        INTERVAL HISTORY:  [name] with above history of metastatic left breast cancer currently on second line therapy with ibrance plus Femara is here for follow-up.    Denies any worsening pain. Denies any shortness of breath or chest pain or cough. No tingling or numbness. No diarrhea. No swelling in legs. No weight gain or weight loss.  The patient's blood sugars are better controlled.   REVIEW OF SYSTEMS:  A complete 10 point review of system is done which is negative except mentioned above/history of present illness.   PAST MEDICAL HISTORY :  Past Medical History:  Diagnosis Date  . Asthma   . CHF (congestive heart failure) ([place]) 1997  . CKD  (chronic kidney disease)   . Depression   . Diabetes mellitus, type 2 ([place])   . Hair loss   . History of left breast cancer 05/29/14  . Obesity   . Pancreatitis 1997  . Stroke [facility]) 2010   with mild left arm weakness    PAST SURGICAL HISTORY :   Past Surgical History:  Procedure Laterality Date  . CESAREAN SECTION    . CHOLECYSTECTOMY      FAMILY HISTORY :   Family History  Problem Relation Age of Onset  . Ovarian cancer Mother   . Diabetes Mother   . Hypertension Mother   . COPD Father   . Hypertension Father   . Diabetes Sister   . Diabetes Brother     SOCIAL HISTORY:   Social History  Substance Use Topics  . Smoking status: Former Smoker    Packs/day: 0.50    Years: 1.00    Types: Cigarettes  . Smokeless tobacco: Never Used  . Alcohol use No    ALLERGIES:  has No Known Allergies.  MEDICATIONS:  Current Outpatient Prescriptions  Medication Sig Dispense Refill  . acetaminophen-codeine (TYLENOL #4) 300-60 MG tablet Take 2 tablets by mouth every 6 (six) hours as needed for moderate pain.     Marland Kitchen albuterol (PROAIR HFA) 108 (90 BASE) MCG/ACT inhaler Inhale 1 puff into the lungs every 6 (six) hours as needed for shortness of breath.     Marland Kitchen albuterol (PROVENTIL) (2.5 MG/3ML) 0.083% nebulizer solution Inhale 2.5 mg into the lungs every 6 (  six) hours as needed for shortness of breath.     . ALPRAZolam (XANAX) 0.5 MG tablet Take 0.5 mg by mouth at bedtime as needed for anxiety or sleep.     Marland Kitchen amLODipine (NORVASC) 10 MG tablet Take 10 mg by mouth daily.     Marland Kitchen aspirin EC 81 MG tablet Take 81 mg by mouth once.     Marland Kitchen atenolol (TENORMIN) 50 MG tablet TAKE 1 1/2 TABLETS BY MOUTH TWICE DAILY    . B-D ULTRA-FINE 33 LANCETS MISC Use 1 each 2 (two) times daily.    . B-D ULTRAFINE III SHORT PEN 31G X 8 MM MISC     . bumetanide (BUMEX) 0.5 MG tablet TAKE 1 TABLET BY MOUTH TWICE DAILY    . Cinnamon 500 MG capsule Take 500 mg by mouth daily.     . cloNIDine (CATAPRES) 0.2 MG tablet  Take 0.2 mg by mouth daily.     . enalapril (VASOTEC) 20 MG tablet Take 20 mg by mouth daily.     . ferrous sulfate 325 (65 FE) MG tablet Take 325 mg by mouth 2 (two) times daily with a meal.    . FLUoxetine (PROZAC) 20 MG capsule Take 20 mg by mouth daily.     Marland Kitchen glucose blood (ONE TOUCH ULTRA TEST) test strip Use 1 each 2 (two) times daily. Use as instructed.    . glyBURIDE (DIABETA) 5 MG tablet Take 5 mg by mouth daily with breakfast.     . IBRANCE 125 MG capsule TAKE 1 CAPSULE BY MOUTH ONE TIME DAILY WITH BREAKFAST FOR 3 WEEKS ON FOLLOWED BY 1 WEEK OFF. TAKE WHOLE WITH FOOD. 21 capsule 6  . letrozole (FEMARA) 2.5 MG tablet Take 1 tablet (2.5 mg total) by mouth daily. 90 tablet 3  . LEVEMIR FLEXTOUCH 100 UNIT/ML Pen Inject 24 Units into the skin daily.     Marland Kitchen NOVOLOG FLEXPEN 100 UNIT/ML FlexPen     . salmeterol (SEREVENT) 50 MCG/DOSE diskus inhaler Inhale 1 puff into the lungs 2 (two) times daily.    . simvastatin (ZOCOR) 20 MG tablet Take 20 mg by mouth daily at 6 PM.     . vitamin B-12 (CYANOCOBALAMIN) 1000 MCG tablet Take 1,000 mcg by mouth daily.     No current facility-administered medications for this visit.    Facility-Administered Medications Ordered in Other Visits  Medication Dose Route Frequency Provider Last Rate Last Dose  . sodium chloride flush (NS) 0.9 % injection 10 mL  10 mL Intravenous PRN Cammie Sickle, MD   10 mL at 01/30/16 1054    PHYSICAL EXAMINATION: ECOG PERFORMANCE STATUS: 0 - Asymptomatic  BP 133/83 (BP Location: Right Arm, Patient Position: Sitting)   Pulse 69   Temp 97.5 F (36.4 C) (Tympanic)   Resp 18   Wt 198 lb (89.8 kg)   BMI 36.21 kg/m   Filed Weights   05/23/16 1131  Weight: 198 lb (89.8 kg)    GENERAL: Well-nourished well-developed; Alert, no distress and comfortable.  She is Accompanied with family.  EYES: no pallor or icterus OROPHARYNX: no thrush or ulceration; good dentition  NECK: supple, no masses felt LYMPH:  no palpable  lymphadenopathy in the cervical, axillary or inguinal regions LUNGS: clear to auscultation and  No wheeze or crackles HEART/CVS: regular rate & rhythm and no murmurs; No lower extremity edema ABDOMEN:abdomen soft, non-tender and normal bowel sounds Musculoskeletal:no cyanosis of digits and no clubbing  PSYCH: alert & oriented x 3  with fluent speech NEURO: no focal motor/sensory deficits SKIN:  no rashes or significant lesions    LABORATORY DATA:  I have reviewed the data as listed    Component Value Date/Time   NA 135 05/23/2016 1050   NA 130 (L) 06/06/2014 1102   K 3.7 05/23/2016 1050   K 3.9 06/06/2014 1102   CL 104 05/23/2016 1050   CL 95 (L) 06/06/2014 1102   CO2 22 05/23/2016 1050   CO2 28 06/06/2014 1102   GLUCOSE 121 (H) 05/23/2016 1050   GLUCOSE 349 (H) 06/06/2014 1102   BUN 43 (H) 05/23/2016 1050   BUN 17 06/06/2014 1102   CREATININE 3.45 (H) 05/23/2016 1050   CREATININE 1.63 (H) 06/06/2014 1102   CALCIUM 9.2 05/23/2016 1050   CALCIUM 9.2 06/06/2014 1102   PROT 7.8 05/23/2016 1050   PROT 8.2 06/06/2014 1102   ALBUMIN 4.1 05/23/2016 1050   ALBUMIN 3.3 (L) 06/06/2014 1102   AST 18 05/23/2016 1050   AST 7 (L) 06/06/2014 1102   ALT 13 (L) 05/23/2016 1050   ALT 12 (L) 06/06/2014 1102   ALKPHOS 58 05/23/2016 1050   ALKPHOS 74 06/06/2014 1102   BILITOT 0.7 05/23/2016 1050   BILITOT 0.4 06/06/2014 1102   GFRNONAA 14 (L) 05/23/2016 1050   GFRNONAA 35 (L) 06/06/2014 1102   GFRAA 16 (L) 05/23/2016 1050   GFRAA 42 (L) 06/06/2014 1102    No results found for: SPEP, UPEP  Lab Results  Component Value Date   WBC 4.3 05/23/2016   NEUTROABS 3.0 05/23/2016   HGB 9.7 (L) 05/23/2016   HCT 27.5 (L) 05/23/2016   MCV 102.4 (H) 05/23/2016   PLT 339 05/23/2016      Chemistry      Component Value Date/Time   NA 135 05/23/2016 1050   NA 130 (L) 06/06/2014 1102   K 3.7 05/23/2016 1050   K 3.9 06/06/2014 1102   CL 104 05/23/2016 1050   CL 95 (L) 06/06/2014 1102   CO2  22 05/23/2016 1050   CO2 28 06/06/2014 1102   BUN 43 (H) 05/23/2016 1050   BUN 17 06/06/2014 1102   CREATININE 3.45 (H) 05/23/2016 1050   CREATININE 1.63 (H) 06/06/2014 1102      Component Value Date/Time   CALCIUM 9.2 05/23/2016 1050   CALCIUM 9.2 06/06/2014 1102   ALKPHOS 58 05/23/2016 1050   ALKPHOS 74 06/06/2014 1102   AST 18 05/23/2016 1050   AST 7 (L) 06/06/2014 1102   ALT 13 (L) 05/23/2016 1050   ALT 12 (L) 06/06/2014 1102   BILITOT 0.7 05/23/2016 1050   BILITOT 0.4 06/06/2014 1102     IMPRESSION: 1. Minimal metabolic activity associated the small LEFT breast mass unchanged from comparison exam. 2. No evidence of hypermetabolic metastatic adenopathy. 3. Sclerotic bone lesions without metabolic activity, unchanged from prior.   Electronically Signed   By: Suzy Bouchard M.D.   On: 03/24/2016 11:22   ASSESSMENT & PLAN:  Carcinoma of upper-inner quadrant of left breast in female, estrogen receptor positive ([place]) # Left breast cancer stage IV/oligometastatic to the mediastinal lymph nodes currently  with ibrance and Femara since March 2016. Clinically  NO evidence of progression. AUG 2017- PET- no progression/improved left breast mass/ stable-treated bone lesions. Discussed that we'll get scans in in December or January.  # Patient will continue Femara with ibrance '125mg'$  3 w-On; 1 W-off. Tolerating without any major side effects. CBC within normal limits except for hemoglobin 9.9. Clinically no  evidence of progression.  # Anemia likely from chronic kidney disease/iron deficiency saturation 7%- Hb 9.7;? From Ibrance. Non- compliance PO  mouth iron once a day. If not IV iron.   # Metastatic disease to the bone-stable/ not on x-geva because of renal failure/ # Chronic kidney disease creatinine stable 3 .4. Follows up with Dr. Juleen China in Dec 2017.   # Follow with labs to marker 4 weeks/MD.       Cammie Sickle, MD 05/23/2016 11:51 AM

## 2016-05-23 NOTE — Assessment & Plan Note (Addendum)
#   Left breast cancer stage IV/oligometastatic to the mediastinal lymph nodes currently  with ibrance and Femara since March 2016. Clinically  NO evidence of progression. AUG 2017- PET- no progression/improved left breast mass/ stable-treated bone lesions. Discussed that we'll get scans in in December or January.  # Patient will continue Femara with ibrance '125mg'$  3 w-On; 1 W-off. Tolerating without any major side effects. CBC within normal limits except for hemoglobin 9.9. Clinically no evidence of progression.  # Anemia likely from chronic kidney disease/iron deficiency saturation 7%- Hb 9.7;? From Ibrance. Non- compliance PO  mouth iron once a day. If not IV iron.   # Metastatic disease to the bone-stable/ not on x-geva because of renal failure/ # Chronic kidney disease creatinine stable 3 .4. Follows up with Dr. Juleen China in Dec 2017.   # Follow with labs to marker 4 weeks/MD.

## 2016-05-24 LAB — CANCER ANTIGEN 27.29: CA 27.29: 50.4 U/mL — AB (ref 0.0–38.6)

## 2016-05-26 DIAGNOSIS — M545 Low back pain: Secondary | ICD-10-CM | POA: Diagnosis not present

## 2016-05-26 DIAGNOSIS — C50812 Malignant neoplasm of overlapping sites of left female breast: Secondary | ICD-10-CM | POA: Diagnosis not present

## 2016-05-26 DIAGNOSIS — I10 Essential (primary) hypertension: Secondary | ICD-10-CM | POA: Diagnosis not present

## 2016-05-26 DIAGNOSIS — E782 Mixed hyperlipidemia: Secondary | ICD-10-CM | POA: Diagnosis not present

## 2016-05-26 DIAGNOSIS — E1121 Type 2 diabetes mellitus with diabetic nephropathy: Secondary | ICD-10-CM | POA: Diagnosis not present

## 2016-05-26 DIAGNOSIS — I5022 Chronic systolic (congestive) heart failure: Secondary | ICD-10-CM | POA: Diagnosis not present

## 2016-05-26 DIAGNOSIS — F3289 Other specified depressive episodes: Secondary | ICD-10-CM | POA: Diagnosis not present

## 2016-05-26 DIAGNOSIS — G8929 Other chronic pain: Secondary | ICD-10-CM | POA: Diagnosis not present

## 2016-06-20 ENCOUNTER — Inpatient Hospital Stay (HOSPITAL_BASED_OUTPATIENT_CLINIC_OR_DEPARTMENT_OTHER): Payer: Medicare Other | Admitting: Internal Medicine

## 2016-06-20 ENCOUNTER — Inpatient Hospital Stay: Payer: Medicare Other

## 2016-06-20 ENCOUNTER — Inpatient Hospital Stay: Payer: Medicare Other | Attending: Internal Medicine

## 2016-06-20 VITALS — BP 126/79 | HR 69 | Temp 97.6°F | Resp 20 | Ht 62.0 in | Wt 199.0 lb

## 2016-06-20 DIAGNOSIS — Z170 Estrogen receptor positive status [ER+]: Secondary | ICD-10-CM | POA: Insufficient documentation

## 2016-06-20 DIAGNOSIS — N189 Chronic kidney disease, unspecified: Secondary | ICD-10-CM | POA: Insufficient documentation

## 2016-06-20 DIAGNOSIS — I509 Heart failure, unspecified: Secondary | ICD-10-CM | POA: Diagnosis not present

## 2016-06-20 DIAGNOSIS — R531 Weakness: Secondary | ICD-10-CM

## 2016-06-20 DIAGNOSIS — Z7982 Long term (current) use of aspirin: Secondary | ICD-10-CM | POA: Diagnosis not present

## 2016-06-20 DIAGNOSIS — C7951 Secondary malignant neoplasm of bone: Secondary | ICD-10-CM | POA: Diagnosis not present

## 2016-06-20 DIAGNOSIS — Z87891 Personal history of nicotine dependence: Secondary | ICD-10-CM | POA: Insufficient documentation

## 2016-06-20 DIAGNOSIS — E1165 Type 2 diabetes mellitus with hyperglycemia: Secondary | ICD-10-CM

## 2016-06-20 DIAGNOSIS — Z79899 Other long term (current) drug therapy: Secondary | ICD-10-CM

## 2016-06-20 DIAGNOSIS — I129 Hypertensive chronic kidney disease with stage 1 through stage 4 chronic kidney disease, or unspecified chronic kidney disease: Secondary | ICD-10-CM | POA: Diagnosis not present

## 2016-06-20 DIAGNOSIS — Z8673 Personal history of transient ischemic attack (TIA), and cerebral infarction without residual deficits: Secondary | ICD-10-CM

## 2016-06-20 DIAGNOSIS — Z8719 Personal history of other diseases of the digestive system: Secondary | ICD-10-CM

## 2016-06-20 DIAGNOSIS — Z794 Long term (current) use of insulin: Secondary | ICD-10-CM | POA: Diagnosis not present

## 2016-06-20 DIAGNOSIS — J45909 Unspecified asthma, uncomplicated: Secondary | ICD-10-CM | POA: Insufficient documentation

## 2016-06-20 DIAGNOSIS — C50212 Malignant neoplasm of upper-inner quadrant of left female breast: Secondary | ICD-10-CM

## 2016-06-20 DIAGNOSIS — Z8041 Family history of malignant neoplasm of ovary: Secondary | ICD-10-CM | POA: Diagnosis not present

## 2016-06-20 DIAGNOSIS — Z79811 Long term (current) use of aromatase inhibitors: Secondary | ICD-10-CM | POA: Diagnosis not present

## 2016-06-20 DIAGNOSIS — E669 Obesity, unspecified: Secondary | ICD-10-CM

## 2016-06-20 DIAGNOSIS — Z9049 Acquired absence of other specified parts of digestive tract: Secondary | ICD-10-CM

## 2016-06-20 DIAGNOSIS — C50412 Malignant neoplasm of upper-outer quadrant of left female breast: Secondary | ICD-10-CM | POA: Diagnosis not present

## 2016-06-20 DIAGNOSIS — D509 Iron deficiency anemia, unspecified: Secondary | ICD-10-CM

## 2016-06-20 LAB — CBC WITH DIFFERENTIAL/PLATELET
BASOS PCT: 1 %
Basophils Absolute: 0 10*3/uL (ref 0–0.1)
Eosinophils Absolute: 0.1 10*3/uL (ref 0–0.7)
Eosinophils Relative: 3 %
HEMATOCRIT: 28 % — AB (ref 35.0–47.0)
Hemoglobin: 10 g/dL — ABNORMAL LOW (ref 12.0–16.0)
Lymphocytes Relative: 28 %
Lymphs Abs: 0.8 10*3/uL — ABNORMAL LOW (ref 1.0–3.6)
MCH: 36.2 pg — ABNORMAL HIGH (ref 26.0–34.0)
MCHC: 35.7 g/dL (ref 32.0–36.0)
MCV: 101.6 fL — AB (ref 80.0–100.0)
MONO ABS: 0.2 10*3/uL (ref 0.2–0.9)
MONOS PCT: 8 %
NEUTROS ABS: 1.8 10*3/uL (ref 1.4–6.5)
Neutrophils Relative %: 60 %
Platelets: 411 10*3/uL (ref 150–440)
RBC: 2.76 MIL/uL — ABNORMAL LOW (ref 3.80–5.20)
RDW: 13 % (ref 11.5–14.5)
WBC: 3.1 10*3/uL — ABNORMAL LOW (ref 3.6–11.0)

## 2016-06-20 LAB — COMPREHENSIVE METABOLIC PANEL
ALBUMIN: 4.1 g/dL (ref 3.5–5.0)
ALT: 15 U/L (ref 14–54)
ANION GAP: 7 (ref 5–15)
AST: 18 U/L (ref 15–41)
Alkaline Phosphatase: 56 U/L (ref 38–126)
BILIRUBIN TOTAL: 0.7 mg/dL (ref 0.3–1.2)
BUN: 36 mg/dL — ABNORMAL HIGH (ref 6–20)
CO2: 26 mmol/L (ref 22–32)
Calcium: 9.3 mg/dL (ref 8.9–10.3)
Chloride: 103 mmol/L (ref 101–111)
Creatinine, Ser: 3.84 mg/dL — ABNORMAL HIGH (ref 0.44–1.00)
GFR, EST AFRICAN AMERICAN: 14 mL/min — AB (ref >60)
GFR, EST NON AFRICAN AMERICAN: 12 mL/min — AB (ref >60)
Glucose, Bld: 72 mg/dL (ref 65–99)
POTASSIUM: 3.9 mmol/L (ref 3.5–5.1)
Sodium: 136 mmol/L (ref 135–145)
TOTAL PROTEIN: 7.6 g/dL (ref 6.5–8.1)

## 2016-06-20 MED ORDER — HEPARIN SOD (PORK) LOCK FLUSH 100 UNIT/ML IV SOLN
500.0000 [IU] | Freq: Once | INTRAVENOUS | Status: AC
  Administered 2016-06-20: 500 [IU] via INTRAVENOUS
  Filled 2016-06-20: qty 5

## 2016-06-20 MED ORDER — SODIUM CHLORIDE 0.9% FLUSH
10.0000 mL | Freq: Once | INTRAVENOUS | Status: AC
  Administered 2016-06-20: 10 mL via INTRAVENOUS
  Filled 2016-06-20: qty 10

## 2016-06-20 NOTE — Progress Notes (Signed)
[facility] OFFICE PROGRESS NOTE  Patient Care Team: [name], [name]   SUMMARY OF ONCOLOGIC HISTORY:  Oncology History   # OCT 2015-STAGE IV LEFT BREAST T2N1 [T=4cm; [name]; [name]; [name]; EBUS- Positive Paratrac/subcarinal LN s/p ? Taxotere [in [place]; [name]; [name];[name]; [name]; suv 3]; [name]; [name]; [name].   # ? Bony lesions- PET sep 2016-non-hypermetabolic sclerotic lesions T10; Ant R iliac bone; inferior sternum- not on X-geva  # Poorly controlled Blood sugars- improved.   # Pancreatitis Hx/ CKD [creat ~ 2-3]     Malignant neoplasm of upper-outer quadrant of female breast ([place])   08/23/2015 Initial Diagnosis    Malignant neoplasm of upper-outer quadrant of female breast ([place])       Breast cancer of upper-outer quadrant of left female breast ([place])   02/28/2016 Initial Diagnosis    Breast cancer of upper-outer quadrant of left female breast ([place])        INTERVAL HISTORY:  [name] with above history of metastatic left breast cancer currently on second line therapy with ibrance plus Femara is here for follow-up.    Denies any swelling of the legs. Denies any significant weight gain. Her diabetes is fairly well controlled. No unusual bone pain.  Denies any shortness of breath or chest pain or cough. No tingling or numbness. No diarrhea. No swelling in legs. No weight gain or weight loss.   REVIEW OF SYSTEMS:  A complete 10 point review of system is done which is negative except mentioned above/history of present illness.   PAST MEDICAL HISTORY :  Past Medical History:  Diagnosis Date  . Asthma   .  CHF (congestive heart failure) ([place]) 1997  . CKD (chronic kidney disease)   . Depression   . Diabetes mellitus, type 2 ([place])   . Hair loss   . History of left breast cancer 05/29/14  . Obesity   . Pancreatitis 1997  . Stroke [facility]) 2010   with mild left arm weakness    PAST SURGICAL HISTORY :   Past Surgical History:  Procedure Laterality Date  . CESAREAN SECTION    . CHOLECYSTECTOMY      FAMILY HISTORY :   Family History  Problem Relation Age of Onset  . Ovarian cancer Mother   . Diabetes Mother   . Hypertension Mother   . COPD Father   . Hypertension Father   . Diabetes Sister   . Diabetes Brother     SOCIAL HISTORY:   Social History  Substance Use Topics  . Smoking status: Former Smoker    Packs/day: 0.50    Years: 1.00    Types: Cigarettes  . Smokeless tobacco: Never Used  . Alcohol use No    ALLERGIES:  has No Known Allergies.  MEDICATIONS:  Current Outpatient Prescriptions  Medication Sig Dispense Refill  . acetaminophen-codeine (TYLENOL #4) 300-60 MG tablet Take 2 tablets by mouth every 6 (six) hours as needed for moderate pain.     Marland Kitchen albuterol (PROAIR HFA) 108 (90 BASE) MCG/ACT inhaler Inhale 1 puff into the lungs every 6 (six) hours as needed for shortness of breath.     Marland Kitchen albuterol (PROVENTIL) (2.5 MG/3ML) 0.083%  nebulizer solution Inhale 2.5 mg into the lungs every 6 (six) hours as needed for shortness of breath.     . ALPRAZolam (XANAX) 0.5 MG tablet Take 0.5 mg by mouth at bedtime as needed for anxiety or sleep.     Marland Kitchen amLODipine (NORVASC) 10 MG tablet Take 10 mg by mouth daily.     Marland Kitchen aspirin EC 81 MG tablet Take 81 mg by mouth once.     Marland Kitchen atenolol (TENORMIN) 50 MG tablet TAKE 1 1/2 TABLETS BY MOUTH TWICE DAILY    . B-D ULTRA-FINE 33 LANCETS MISC Use 1 each 2 (two) times daily.    . B-D ULTRAFINE III SHORT PEN 31G X 8 MM MISC     . bumetanide (BUMEX) 0.5 MG tablet TAKE 1 TABLET BY MOUTH TWICE DAILY    . Cinnamon 500 MG capsule Take 500 mg by mouth  daily.     . cloNIDine (CATAPRES) 0.2 MG tablet Take 0.2 mg by mouth daily.     . enalapril (VASOTEC) 20 MG tablet Take 20 mg by mouth daily.     . ferrous sulfate 325 (65 FE) MG tablet Take 325 mg by mouth 2 (two) times daily with a meal.    . FLUoxetine (PROZAC) 20 MG capsule Take 20 mg by mouth daily.     Marland Kitchen glucose blood (ONE TOUCH ULTRA TEST) test strip Use 1 each 2 (two) times daily. Use as instructed.    . glyBURIDE (DIABETA) 5 MG tablet Take 5 mg by mouth daily with breakfast.     . IBRANCE 125 MG capsule TAKE 1 CAPSULE BY MOUTH ONE TIME DAILY WITH BREAKFAST FOR 3 WEEKS ON FOLLOWED BY 1 WEEK OFF. TAKE WHOLE WITH FOOD. 21 capsule 6  . letrozole (FEMARA) 2.5 MG tablet Take 1 tablet (2.5 mg total) by mouth daily. 90 tablet 3  . LEVEMIR FLEXTOUCH 100 UNIT/ML Pen Inject 48 Units into the skin daily.     Marland Kitchen NOVOLOG FLEXPEN 100 UNIT/ML FlexPen     . salmeterol (SEREVENT) 50 MCG/DOSE diskus inhaler Inhale 1 puff into the lungs 2 (two) times daily.    . simvastatin (ZOCOR) 20 MG tablet Take 20 mg by mouth daily at 6 PM.     . vitamin B-12 (CYANOCOBALAMIN) 1000 MCG tablet Take 1,000 mcg by mouth daily.     No current facility-administered medications for this visit.    Facility-Administered Medications Ordered in Other Visits  Medication Dose Route Frequency Provider Last Rate Last Dose  . sodium chloride flush (NS) 0.9 % injection 10 mL  10 mL Intravenous PRN Cammie Sickle, MD   10 mL at 01/30/16 1054    PHYSICAL EXAMINATION: ECOG PERFORMANCE STATUS: 0 - Asymptomatic  BP 126/79 (Patient Position: Sitting)   Pulse 69   Temp 97.6 F (36.4 C) (Tympanic)   Resp 20   Ht '5\' 2"'$  (1.575 m)   Wt 199 lb (90.3 kg)   BMI 36.40 kg/m   Filed Weights   06/20/16 1051  Weight: 199 lb (90.3 kg)    GENERAL: Well-nourished well-developed; Alert, no distress and comfortable.  She is Accompanied with family.  EYES: no pallor or icterus OROPHARYNX: no thrush or ulceration; good dentition   NECK: supple, no masses felt LYMPH:  no palpable lymphadenopathy in the cervical, axillary or inguinal regions LUNGS: clear to auscultation and  No wheeze or crackles HEART/CVS: regular rate & rhythm and no murmurs; No lower extremity edema ABDOMEN:abdomen soft, non-tender and normal bowel sounds Musculoskeletal:no  cyanosis of digits and no clubbing  PSYCH: alert & oriented x 3 with fluent speech NEURO: no focal motor/sensory deficits SKIN:  no rashes or significant lesions    LABORATORY DATA:  I have reviewed the data as listed    Component Value Date/Time   NA 136 06/20/2016 0910   NA 130 (L) 06/06/2014 1102   K 3.9 06/20/2016 0910   K 3.9 06/06/2014 1102   CL 103 06/20/2016 0910   CL 95 (L) 06/06/2014 1102   CO2 26 06/20/2016 0910   CO2 28 06/06/2014 1102   GLUCOSE 72 06/20/2016 0910   GLUCOSE 349 (H) 06/06/2014 1102   BUN 36 (H) 06/20/2016 0910   BUN 17 06/06/2014 1102   CREATININE 3.84 (H) 06/20/2016 0910   CREATININE 1.63 (H) 06/06/2014 1102   CALCIUM 9.3 06/20/2016 0910   CALCIUM 9.2 06/06/2014 1102   PROT 7.6 06/20/2016 0910   PROT 8.2 06/06/2014 1102   ALBUMIN 4.1 06/20/2016 0910   ALBUMIN 3.3 (L) 06/06/2014 1102   AST 18 06/20/2016 0910   AST 7 (L) 06/06/2014 1102   ALT 15 06/20/2016 0910   ALT 12 (L) 06/06/2014 1102   ALKPHOS 56 06/20/2016 0910   ALKPHOS 74 06/06/2014 1102   BILITOT 0.7 06/20/2016 0910   BILITOT 0.4 06/06/2014 1102   GFRNONAA 12 (L) 06/20/2016 0910   GFRNONAA 35 (L) 06/06/2014 1102   GFRAA 14 (L) 06/20/2016 0910   GFRAA 42 (L) 06/06/2014 1102    No results found for: SPEP, UPEP  Lab Results  Component Value Date   WBC 3.1 (L) 06/20/2016   NEUTROABS 1.8 06/20/2016   HGB 10.0 (L) 06/20/2016   HCT 28.0 (L) 06/20/2016   MCV 101.6 (H) 06/20/2016   PLT 411 06/20/2016      Chemistry      Component Value Date/Time   NA 136 06/20/2016 0910   NA 130 (L) 06/06/2014 1102   K 3.9 06/20/2016 0910   K 3.9 06/06/2014 1102   CL 103  06/20/2016 0910   CL 95 (L) 06/06/2014 1102   CO2 26 06/20/2016 0910   CO2 28 06/06/2014 1102   BUN 36 (H) 06/20/2016 0910   BUN 17 06/06/2014 1102   CREATININE 3.84 (H) 06/20/2016 0910   CREATININE 1.63 (H) 06/06/2014 1102      Component Value Date/Time   CALCIUM 9.3 06/20/2016 0910   CALCIUM 9.2 06/06/2014 1102   ALKPHOS 56 06/20/2016 0910   ALKPHOS 74 06/06/2014 1102   AST 18 06/20/2016 0910   AST 7 (L) 06/06/2014 1102   ALT 15 06/20/2016 0910   ALT 12 (L) 06/06/2014 1102   BILITOT 0.7 06/20/2016 0910   BILITOT 0.4 06/06/2014 1102     IMPRESSION: 1. Minimal metabolic activity associated the small LEFT breast mass unchanged from comparison exam. 2. No evidence of hypermetabolic metastatic adenopathy. 3. Sclerotic bone lesions without metabolic activity, unchanged from prior.   Electronically Signed   By: Suzy Bouchard M.D.   On: 03/24/2016 11:22  Results for EMILI, MCLOUGHLIN (MRN [id number]) as of 06/20/2016 10:54  Ref. Range 06/06/2014 11:02 03/28/2016 11:25 05/23/2016 10:50  CA 27.29 Latest Ref Range: 0.0 - 38.6 U/mL 58.0 (H) 40.7 (H) 50.4 (H)   ASSESSMENT & PLAN:  Carcinoma of upper-inner quadrant of left breast in female, estrogen receptor positive ([place]) # Left breast cancer stage IV/oligometastatic to the mediastinal lymph nodes currently  with ibrance and Femara since March 2016. Clinically  NO evidence of progression. AUG 2017-  PET- no progression/improved left breast mass/ stable-treated bone lesions. TM going up as above.   # Patient will continue Femara with ibrance '125mg'$  3 w-On; 1 W-off. Tolerating without any major side effects. CBC within normal limits except for hemoglobin 10. Clinically no evidence of progression. We will get a PET scan for further evaluation- especially the tumor marker going up.  # Anemia/ IDA-CKD- Hb 10 s/p Iv iron.   # CKD- creat 3.8/ slightly worse; recommend increasing fluids. appt with nephrology on dec 1st.   # Follow with  labs to marker 4 weeks/MD; PET scan prior.       Cammie Sickle, MD 06/20/2016 12:14 PM

## 2016-06-20 NOTE — Progress Notes (Signed)
Pt is doing well today Pt reports NO tablets/doses missed in the last week/month.  Pt has tolerated the medication well without any side effects  Needs RF on femara.

## 2016-06-20 NOTE — Assessment & Plan Note (Addendum)
#   Left breast cancer stage IV/oligometastatic to the mediastinal lymph nodes currently  with ibrance and Femara since March 2016. Clinically  NO evidence of progression. AUG 2017- PET- no progression/improved left breast mass/ stable-treated bone lesions. TM going up as above.   # Patient will continue Femara with ibrance '125mg'$  3 w-On; 1 W-off. Tolerating without any major side effects. CBC within normal limits except for hemoglobin 10. Clinically no evidence of progression. We will get a PET scan for further evaluation- especially the tumor marker going up.  # Anemia/ IDA-CKD- Hb 10 s/p Iv iron.   # CKD- creat 3.8/ slightly worse; recommend increasing fluids. appt with nephrology on dec 1st.   # Follow with labs to marker 4 weeks/MD; PET scan prior.

## 2016-06-21 LAB — CANCER ANTIGEN 27.29: CA 27.29: 51 U/mL — ABNORMAL HIGH (ref 0.0–38.6)

## 2016-06-23 ENCOUNTER — Telehealth: Payer: Self-pay

## 2016-06-23 NOTE — Telephone Encounter (Signed)
Patient has been notified that tumor markers remained stable and to follow up as planned per heather.

## 2016-06-23 NOTE — Telephone Encounter (Signed)
-----   Message from Sabino Gasser, RN sent at 06/23/2016  4:19 PM EST ----- Regarding: FW: call remind with tumor markers   ----- Message ----- From: Sabino Gasser, RN Sent: 06/20/2016  10:50 AM To: Sabino Gasser, RN Subject: call remind with tumor markers                 Tumor marker

## 2016-07-04 DIAGNOSIS — N184 Chronic kidney disease, stage 4 (severe): Secondary | ICD-10-CM | POA: Diagnosis not present

## 2016-07-04 DIAGNOSIS — R809 Proteinuria, unspecified: Secondary | ICD-10-CM | POA: Diagnosis not present

## 2016-07-04 DIAGNOSIS — I129 Hypertensive chronic kidney disease with stage 1 through stage 4 chronic kidney disease, or unspecified chronic kidney disease: Secondary | ICD-10-CM | POA: Diagnosis not present

## 2016-07-04 DIAGNOSIS — N2581 Secondary hyperparathyroidism of renal origin: Secondary | ICD-10-CM | POA: Diagnosis not present

## 2016-07-04 DIAGNOSIS — E1122 Type 2 diabetes mellitus with diabetic chronic kidney disease: Secondary | ICD-10-CM | POA: Diagnosis not present

## 2016-07-11 ENCOUNTER — Ambulatory Visit
Admission: RE | Admit: 2016-07-11 | Discharge: 2016-07-11 | Disposition: A | Discharge status: Home / Self Care | Payer: Medicare Other | Source: Ambulatory Visit | Attending: Internal Medicine | Admitting: Internal Medicine

## 2016-07-11 DIAGNOSIS — Z79899 Other long term (current) drug therapy: Secondary | ICD-10-CM | POA: Insufficient documentation

## 2016-07-11 DIAGNOSIS — Z7982 Long term (current) use of aspirin: Secondary | ICD-10-CM | POA: Insufficient documentation

## 2016-07-11 DIAGNOSIS — C50212 Malignant neoplasm of upper-inner quadrant of left female breast: Secondary | ICD-10-CM | POA: Diagnosis not present

## 2016-07-11 DIAGNOSIS — Z170 Estrogen receptor positive status [ER+]: Secondary | ICD-10-CM | POA: Insufficient documentation

## 2016-07-11 LAB — GLUCOSE, CAPILLARY
Glucose-Capillary: 213 mg/dL — ABNORMAL HIGH (ref 65–99)
Glucose-Capillary: 228 mg/dL — ABNORMAL HIGH (ref 65–99)

## 2016-07-15 ENCOUNTER — Ambulatory Visit
Admission: RE | Admit: 2016-07-15 | Discharge: 2016-07-15 | Disposition: A | Discharge status: Home / Self Care | Payer: Medicare Other | Source: Ambulatory Visit | Attending: Internal Medicine | Admitting: Internal Medicine

## 2016-07-15 DIAGNOSIS — Z170 Estrogen receptor positive status [ER+]: Secondary | ICD-10-CM | POA: Insufficient documentation

## 2016-07-15 DIAGNOSIS — J329 Chronic sinusitis, unspecified: Secondary | ICD-10-CM | POA: Diagnosis not present

## 2016-07-15 DIAGNOSIS — C50212 Malignant neoplasm of upper-inner quadrant of left female breast: Secondary | ICD-10-CM | POA: Insufficient documentation

## 2016-07-15 DIAGNOSIS — M899 Disorder of bone, unspecified: Secondary | ICD-10-CM | POA: Insufficient documentation

## 2016-07-15 DIAGNOSIS — C50912 Malignant neoplasm of unspecified site of left female breast: Secondary | ICD-10-CM | POA: Diagnosis not present

## 2016-07-15 LAB — GLUCOSE, CAPILLARY: Glucose-Capillary: 134 mg/dL — ABNORMAL HIGH (ref 65–99)

## 2016-07-15 MED ORDER — FLUDEOXYGLUCOSE F - 18 (FDG) INJECTION
12.7000 | Freq: Once | INTRAVENOUS | Status: AC | PRN
  Administered 2016-07-15: 12.7 via INTRAVENOUS

## 2016-07-18 ENCOUNTER — Inpatient Hospital Stay: Payer: Medicare Other

## 2016-07-18 ENCOUNTER — Inpatient Hospital Stay (HOSPITAL_BASED_OUTPATIENT_CLINIC_OR_DEPARTMENT_OTHER): Payer: Medicare Other | Admitting: Internal Medicine

## 2016-07-18 ENCOUNTER — Other Ambulatory Visit: Payer: Self-pay

## 2016-07-18 ENCOUNTER — Inpatient Hospital Stay: Payer: Medicare Other | Attending: Internal Medicine

## 2016-07-18 VITALS — BP 131/79 | HR 68 | Temp 95.8°F | Wt 194.0 lb

## 2016-07-18 DIAGNOSIS — E669 Obesity, unspecified: Secondary | ICD-10-CM

## 2016-07-18 DIAGNOSIS — J45909 Unspecified asthma, uncomplicated: Secondary | ICD-10-CM | POA: Diagnosis not present

## 2016-07-18 DIAGNOSIS — Z7982 Long term (current) use of aspirin: Secondary | ICD-10-CM | POA: Diagnosis not present

## 2016-07-18 DIAGNOSIS — Z79811 Long term (current) use of aromatase inhibitors: Secondary | ICD-10-CM | POA: Diagnosis not present

## 2016-07-18 DIAGNOSIS — C50412 Malignant neoplasm of upper-outer quadrant of left female breast: Secondary | ICD-10-CM | POA: Diagnosis not present

## 2016-07-18 DIAGNOSIS — Z79899 Other long term (current) drug therapy: Secondary | ICD-10-CM | POA: Diagnosis not present

## 2016-07-18 DIAGNOSIS — E119 Type 2 diabetes mellitus without complications: Secondary | ICD-10-CM | POA: Insufficient documentation

## 2016-07-18 DIAGNOSIS — Z8673 Personal history of transient ischemic attack (TIA), and cerebral infarction without residual deficits: Secondary | ICD-10-CM

## 2016-07-18 DIAGNOSIS — E1165 Type 2 diabetes mellitus with hyperglycemia: Secondary | ICD-10-CM

## 2016-07-18 DIAGNOSIS — Z8041 Family history of malignant neoplasm of ovary: Secondary | ICD-10-CM | POA: Diagnosis not present

## 2016-07-18 DIAGNOSIS — C771 Secondary and unspecified malignant neoplasm of intrathoracic lymph nodes: Secondary | ICD-10-CM

## 2016-07-18 DIAGNOSIS — F329 Major depressive disorder, single episode, unspecified: Secondary | ICD-10-CM | POA: Diagnosis not present

## 2016-07-18 DIAGNOSIS — D509 Iron deficiency anemia, unspecified: Secondary | ICD-10-CM

## 2016-07-18 DIAGNOSIS — I509 Heart failure, unspecified: Secondary | ICD-10-CM

## 2016-07-18 DIAGNOSIS — I129 Hypertensive chronic kidney disease with stage 1 through stage 4 chronic kidney disease, or unspecified chronic kidney disease: Secondary | ICD-10-CM | POA: Insufficient documentation

## 2016-07-18 DIAGNOSIS — Z170 Estrogen receptor positive status [ER+]: Secondary | ICD-10-CM

## 2016-07-18 DIAGNOSIS — Z87891 Personal history of nicotine dependence: Secondary | ICD-10-CM | POA: Insufficient documentation

## 2016-07-18 DIAGNOSIS — N184 Chronic kidney disease, stage 4 (severe): Secondary | ICD-10-CM | POA: Diagnosis not present

## 2016-07-18 DIAGNOSIS — C50212 Malignant neoplasm of upper-inner quadrant of left female breast: Secondary | ICD-10-CM

## 2016-07-18 DIAGNOSIS — Z95828 Presence of other vascular implants and grafts: Secondary | ICD-10-CM

## 2016-07-18 LAB — COMPREHENSIVE METABOLIC PANEL
ALBUMIN: 3.8 g/dL (ref 3.5–5.0)
ALK PHOS: 65 U/L (ref 38–126)
ALT: 15 U/L (ref 14–54)
AST: 18 U/L (ref 15–41)
Anion gap: 5 (ref 5–15)
BILIRUBIN TOTAL: 0.7 mg/dL (ref 0.3–1.2)
BUN: 30 mg/dL — AB (ref 6–20)
CALCIUM: 8.9 mg/dL (ref 8.9–10.3)
CO2: 24 mmol/L (ref 22–32)
CREATININE: 3.73 mg/dL — AB (ref 0.44–1.00)
Chloride: 106 mmol/L (ref 101–111)
GFR calc Af Amer: 14 mL/min — ABNORMAL LOW (ref >60)
GFR, EST NON AFRICAN AMERICAN: 12 mL/min — AB (ref >60)
GLUCOSE: 99 mg/dL (ref 65–99)
POTASSIUM: 3.9 mmol/L (ref 3.5–5.1)
Sodium: 135 mmol/L (ref 135–145)
TOTAL PROTEIN: 7.6 g/dL (ref 6.5–8.1)

## 2016-07-18 LAB — CBC WITH DIFFERENTIAL/PLATELET
BASOS ABS: 0.1 10*3/uL (ref 0–0.1)
Basophils Relative: 2 %
Eosinophils Absolute: 0 10*3/uL (ref 0–0.7)
Eosinophils Relative: 1 %
HEMATOCRIT: 25.4 % — AB (ref 35.0–47.0)
HEMOGLOBIN: 8.8 g/dL — AB (ref 12.0–16.0)
LYMPHS PCT: 19 %
Lymphs Abs: 0.7 10*3/uL — ABNORMAL LOW (ref 1.0–3.6)
MCH: 34.8 pg — ABNORMAL HIGH (ref 26.0–34.0)
MCHC: 34.8 g/dL (ref 32.0–36.0)
MCV: 100 fL (ref 80.0–100.0)
MONO ABS: 0.2 10*3/uL (ref 0.2–0.9)
Monocytes Relative: 5 %
NEUTROS ABS: 2.8 10*3/uL (ref 1.4–6.5)
NEUTROS PCT: 73 %
Platelets: 469 10*3/uL — ABNORMAL HIGH (ref 150–440)
RBC: 2.54 MIL/uL — ABNORMAL LOW (ref 3.80–5.20)
RDW: 13.4 % (ref 11.5–14.5)
WBC: 3.8 10*3/uL (ref 3.6–11.0)

## 2016-07-18 LAB — IRON AND TIBC
Iron: 153 ug/dL (ref 28–170)
Saturation Ratios: 49 % — ABNORMAL HIGH (ref 10.4–31.8)
TIBC: 311 ug/dL (ref 250–450)
UIBC: 158 ug/dL

## 2016-07-18 LAB — FERRITIN: FERRITIN: 332 ng/mL — AB (ref 11–307)

## 2016-07-18 MED ORDER — SODIUM CHLORIDE 0.9% FLUSH
10.0000 mL | INTRAVENOUS | Status: DC | PRN
  Administered 2016-07-18: 10 mL via INTRAVENOUS
  Filled 2016-07-18: qty 10

## 2016-07-18 MED ORDER — HEPARIN SOD (PORK) LOCK FLUSH 100 UNIT/ML IV SOLN
500.0000 [IU] | Freq: Once | INTRAVENOUS | Status: AC
  Administered 2016-07-18: 500 [IU] via INTRAVENOUS

## 2016-07-18 NOTE — Progress Notes (Signed)
Patient here today for follow up Carcinoma of upper-inner quadrant of left breast in female.  Patient has no new concerns today.  Patient states that she is tolerating IBRANCE 125 MG capsule well with no problems, Patient states that she has not missed any doses.

## 2016-07-18 NOTE — Progress Notes (Signed)
[facility] OFFICE PROGRESS NOTE  Patient Care Team: [name], [name]   SUMMARY OF ONCOLOGIC HISTORY:  Oncology History   # OCT 2015-STAGE IV LEFT BREAST T2N1 [T=4cm; [name]; [name]; [name]; EBUS- Positive Paratrac/subcarinal LN s/p ? Taxotere [in [place]; [name]; [name];[name]; [name]; suv 3]; [name]; [name]; [name];  [name]; Ant R iliac bone; inferior sternum- not on X-geva  # Poorly controlled Blood sugars- improved.   # Pancreatitis Hx/ CKD IV [creat ~ 2-3]     Malignant neoplasm of upper-outer quadrant of female breast ([place])   08/23/2015 Initial Diagnosis    Malignant neoplasm of upper-outer quadrant of female breast ([place])       Breast cancer of upper-outer quadrant of left female breast ([place])   02/28/2016 Initial Diagnosis    Breast cancer of upper-outer quadrant of left female breast ([place])       Carcinoma of upper-inner quadrant of left breast in female, estrogen receptor positive ([place])   05/23/2016 Initial Diagnosis    Carcinoma of upper-inner quadrant of left breast in female, estrogen receptor positive ([place])       INTERVAL HISTORY:  [name] with above history of metastatic left breast cancer currently on second line therapy with ibrance plus Femara is here for follow-up/ To review the results of the PET scan.     She continues to be off dialysis. Denies any swelling of the legs. Denies any significant weight gain.  No unusual bone pain.  Denies any shortness of breath or chest pain  or cough. No tingling or numbness. No diarrhea. No swelling in legs. No weight gain or weight loss.     REVIEW OF SYSTEMS:  A complete 10 point review of system is done which is negative except mentioned above/history of present illness.   PAST MEDICAL HISTORY :  Past Medical History:  Diagnosis Date  . Asthma   . CHF (congestive heart failure) ([place]) 1997  . CKD (chronic kidney disease)   . Depression   . Diabetes mellitus, type 2 ([place])   . Hair loss   . History of left breast cancer 05/29/14  . Obesity   . Pancreatitis 1997  . Stroke [facility]) 2010   with mild left arm weakness    PAST SURGICAL HISTORY :   Past Surgical History:  Procedure Laterality Date  . CESAREAN SECTION    . CHOLECYSTECTOMY      FAMILY HISTORY :   Family History  Problem Relation Age of Onset  . Ovarian cancer Mother   . Diabetes Mother   . Hypertension Mother   . COPD Father   . Hypertension Father   . Diabetes Sister   . Diabetes Brother     SOCIAL HISTORY:   Social History  Substance Use Topics  . Smoking status: Former Smoker    Packs/day: 0.50    Years: 1.00    Types: Cigarettes  . Smokeless tobacco: Never Used  . Alcohol use No    ALLERGIES:  has No Known Allergies.  MEDICATIONS:  Current Outpatient Prescriptions  Medication Sig Dispense  Refill  . acetaminophen-codeine (TYLENOL #4) 300-60 MG tablet Take 2 tablets by mouth every 6 (six) hours as needed for moderate pain.     Marland Kitchen albuterol (PROAIR HFA) 108 (90 BASE) MCG/ACT inhaler Inhale 1 puff into the lungs every 6 (six) hours as needed for shortness of breath.     Marland Kitchen albuterol (PROVENTIL) (2.5 MG/3ML) 0.083% nebulizer solution Inhale 2.5 mg into the lungs every 6 (six) hours as needed for shortness of breath.     . ALPRAZolam (XANAX) 0.5 MG tablet Take 0.5 mg by mouth at bedtime as needed for anxiety or sleep.     Marland Kitchen amLODipine (NORVASC) 10 MG tablet Take 10 mg by mouth daily.     Marland Kitchen aspirin EC 81 MG tablet Take 81 mg by mouth once.      Marland Kitchen atenolol (TENORMIN) 50 MG tablet TAKE 1 1/2 TABLETS BY MOUTH TWICE DAILY    . B-D ULTRA-FINE 33 LANCETS MISC Use 1 each 2 (two) times daily.    . B-D ULTRAFINE III SHORT PEN 31G X 8 MM MISC     . bumetanide (BUMEX) 0.5 MG tablet TAKE 1 TABLET BY MOUTH TWICE DAILY    . Cinnamon 500 MG capsule Take 500 mg by mouth daily.     . cloNIDine (CATAPRES) 0.2 MG tablet Take 0.2 mg by mouth daily.     . enalapril (VASOTEC) 20 MG tablet Take 20 mg by mouth daily.     . ferrous sulfate 325 (65 FE) MG tablet Take 325 mg by mouth 2 (two) times daily with a meal.    . FLUoxetine (PROZAC) 20 MG capsule Take 20 mg by mouth daily.     Marland Kitchen glucose blood (ONE TOUCH ULTRA TEST) test strip Use 1 each 2 (two) times daily. Use as instructed.    . glyBURIDE (DIABETA) 5 MG tablet Take 5 mg by mouth daily with breakfast.     . IBRANCE 125 MG capsule TAKE 1 CAPSULE BY MOUTH ONE TIME DAILY WITH BREAKFAST FOR 3 WEEKS ON FOLLOWED BY 1 WEEK OFF. TAKE WHOLE WITH FOOD. 21 capsule 6  . letrozole (FEMARA) 2.5 MG tablet Take 1 tablet (2.5 mg total) by mouth daily. 90 tablet 3  . LEVEMIR FLEXTOUCH 100 UNIT/ML Pen Inject 48 Units into the skin daily.     Marland Kitchen NOVOLOG FLEXPEN 100 UNIT/ML FlexPen     . salmeterol (SEREVENT) 50 MCG/DOSE diskus inhaler Inhale 1 puff into the lungs 2 (two) times daily.    . simvastatin (ZOCOR) 20 MG tablet Take 20 mg by mouth daily at 6 PM.     . vitamin B-12 (CYANOCOBALAMIN) 1000 MCG tablet Take 1,000 mcg by mouth daily.     No current facility-administered medications for this visit.    Facility-Administered Medications Ordered in Other Visits  Medication Dose Route Frequency Provider Last Rate Last Dose  . sodium chloride flush (NS) 0.9 % injection 10 mL  10 mL Intravenous PRN Cammie Sickle, MD   10 mL at 01/30/16 1054    PHYSICAL EXAMINATION: ECOG PERFORMANCE STATUS: 0 - Asymptomatic  BP 131/79 (BP Location: Right Arm, Patient Position: Sitting)   Pulse 68   Temp (!) 95.8 F (35.4 C)  (Tympanic)   Wt 194 lb (88 kg)   BMI 35.48 kg/m   Filed Weights   07/18/16 1001  Weight: 194 lb (88 kg)    GENERAL: Well-nourished well-developed; Alert, no distress and comfortable.  She is Accompanied with family.  EYES: no pallor or  icterus OROPHARYNX: no thrush or ulceration; good dentition  NECK: supple, no masses felt LYMPH:  no palpable lymphadenopathy in the cervical, axillary or inguinal regions LUNGS: clear to auscultation and  No wheeze or crackles HEART/CVS: regular rate & rhythm and no murmurs; No lower extremity edema ABDOMEN:abdomen soft, non-tender and normal bowel sounds Musculoskeletal:no cyanosis of digits and no clubbing  PSYCH: alert & oriented x 3 with fluent speech NEURO: no focal motor/sensory deficits SKIN:  no rashes or significant lesions    LABORATORY DATA:  I have reviewed the data as listed    Component Value Date/Time   NA 135 07/18/2016 0850   NA 130 (L) 06/06/2014 1102   K 3.9 07/18/2016 0850   K 3.9 06/06/2014 1102   CL 106 07/18/2016 0850   CL 95 (L) 06/06/2014 1102   CO2 24 07/18/2016 0850   CO2 28 06/06/2014 1102   GLUCOSE 99 07/18/2016 0850   GLUCOSE 349 (H) 06/06/2014 1102   BUN 30 (H) 07/18/2016 0850   BUN 17 06/06/2014 1102   CREATININE 3.73 (H) 07/18/2016 0850   CREATININE 1.63 (H) 06/06/2014 1102   CALCIUM 8.9 07/18/2016 0850   CALCIUM 9.2 06/06/2014 1102   PROT 7.6 07/18/2016 0850   PROT 8.2 06/06/2014 1102   ALBUMIN 3.8 07/18/2016 0850   ALBUMIN 3.3 (L) 06/06/2014 1102   AST 18 07/18/2016 0850   AST 7 (L) 06/06/2014 1102   ALT 15 07/18/2016 0850   ALT 12 (L) 06/06/2014 1102   ALKPHOS 65 07/18/2016 0850   ALKPHOS 74 06/06/2014 1102   BILITOT 0.7 07/18/2016 0850   BILITOT 0.4 06/06/2014 1102   GFRNONAA 12 (L) 07/18/2016 0850   GFRNONAA 35 (L) 06/06/2014 1102   GFRAA 14 (L) 07/18/2016 0850   GFRAA 42 (L) 06/06/2014 1102    No results found for: SPEP, UPEP  Lab Results  Component Value Date   WBC 3.8  07/18/2016   NEUTROABS 2.8 07/18/2016   HGB 8.8 (L) 07/18/2016   HCT 25.4 (L) 07/18/2016   MCV 100.0 07/18/2016   PLT 469 (H) 07/18/2016      Chemistry      Component Value Date/Time   NA 135 07/18/2016 0850   NA 130 (L) 06/06/2014 1102   K 3.9 07/18/2016 0850   K 3.9 06/06/2014 1102   CL 106 07/18/2016 0850   CL 95 (L) 06/06/2014 1102   CO2 24 07/18/2016 0850   CO2 28 06/06/2014 1102   BUN 30 (H) 07/18/2016 0850   BUN 17 06/06/2014 1102   CREATININE 3.73 (H) 07/18/2016 0850   CREATININE 1.63 (H) 06/06/2014 1102      Component Value Date/Time   CALCIUM 8.9 07/18/2016 0850   CALCIUM 9.2 06/06/2014 1102   ALKPHOS 65 07/18/2016 0850   ALKPHOS 74 06/06/2014 1102   AST 18 07/18/2016 0850   AST 7 (L) 06/06/2014 1102   ALT 15 07/18/2016 0850   ALT 12 (L) 06/06/2014 1102   BILITOT 0.7 07/18/2016 0850   BILITOT 0.4 06/06/2014 1102      Results for HALEN, MOSSBARGER A (MRN [id number]) as of 06/20/2016 10:54  Ref. Range 06/06/2014 11:02 03/28/2016 11:25 05/23/2016 10:50  CA 27.29 Latest Ref Range: 0.0 - 38.6 U/mL 58.0 (H) 40.7 (H) 50.4 (H)   ASSESSMENT & PLAN:  Carcinoma of upper-inner quadrant of left breast in female, estrogen receptor positive ([place]) # Left breast cancer stage IV/oligometastatic to the mediastinal lymph nodes currently  with ibrance and Femara since March 2016.  Clinically  NO evidence of progression. DEC 12th 2017- PET- no progression/improved left breast mass/ stable-treated bone lesions.   # Patient will continue Femara with ibrance '125mg'$  3 w-On; 1 W-off. Tolerating without any major side effects. CBC within normal limits except for hemoglobin 10. Clinically no evidence of progression.   # Anemia/ IDA-CKD- Hb 8.8; recommend increasing PO iron BID. Will recheck iron studies today.   # CKD Stage IV- creat 3.8/ slightly worse; Stable  # Follow with labs to marker 6 weeks/MD.       Cammie Sickle, MD 07/19/2016 11:45 AM

## 2016-07-18 NOTE — Assessment & Plan Note (Addendum)
#   Left breast cancer stage IV/oligometastatic to the mediastinal lymph nodes currently  with ibrance and Femara since March 2016.  Clinically  NO evidence of progression. DEC 12th 2017- PET- no progression/improved left breast mass/ stable-treated bone lesions.   # Patient will continue Femara with ibrance '125mg'$  3 w-On; 1 W-off. Tolerating without any major side effects. CBC within normal limits except for hemoglobin 10. Clinically no evidence of progression.   # Anemia/ IDA-CKD- Hb 8.8; recommend increasing PO iron BID. Will recheck iron studies today.   # CKD Stage IV- creat 3.8/ slightly worse; Stable  # Follow with labs to marker 6 weeks/MD.

## 2016-08-01 ENCOUNTER — Inpatient Hospital Stay: Payer: Medicare Other

## 2016-08-12 DIAGNOSIS — I120 Hypertensive chronic kidney disease with stage 5 chronic kidney disease or end stage renal disease: Secondary | ICD-10-CM | POA: Diagnosis not present

## 2016-08-12 DIAGNOSIS — E1122 Type 2 diabetes mellitus with diabetic chronic kidney disease: Secondary | ICD-10-CM | POA: Diagnosis not present

## 2016-08-12 DIAGNOSIS — R809 Proteinuria, unspecified: Secondary | ICD-10-CM | POA: Diagnosis not present

## 2016-08-12 DIAGNOSIS — N2581 Secondary hyperparathyroidism of renal origin: Secondary | ICD-10-CM | POA: Diagnosis not present

## 2016-08-12 DIAGNOSIS — I129 Hypertensive chronic kidney disease with stage 1 through stage 4 chronic kidney disease, or unspecified chronic kidney disease: Secondary | ICD-10-CM | POA: Diagnosis not present

## 2016-08-12 DIAGNOSIS — D631 Anemia in chronic kidney disease: Secondary | ICD-10-CM | POA: Diagnosis not present

## 2016-08-12 DIAGNOSIS — N185 Chronic kidney disease, stage 5: Secondary | ICD-10-CM | POA: Diagnosis not present

## 2016-08-29 ENCOUNTER — Inpatient Hospital Stay (HOSPITAL_BASED_OUTPATIENT_CLINIC_OR_DEPARTMENT_OTHER): Payer: Medicare Other | Admitting: Internal Medicine

## 2016-08-29 ENCOUNTER — Inpatient Hospital Stay: Payer: Medicare Other | Attending: Internal Medicine

## 2016-08-29 VITALS — BP 138/88 | HR 68 | Temp 97.3°F | Resp 18 | Wt 195.1 lb

## 2016-08-29 DIAGNOSIS — J45909 Unspecified asthma, uncomplicated: Secondary | ICD-10-CM

## 2016-08-29 DIAGNOSIS — Z9049 Acquired absence of other specified parts of digestive tract: Secondary | ICD-10-CM | POA: Diagnosis not present

## 2016-08-29 DIAGNOSIS — Z79811 Long term (current) use of aromatase inhibitors: Secondary | ICD-10-CM

## 2016-08-29 DIAGNOSIS — E669 Obesity, unspecified: Secondary | ICD-10-CM | POA: Diagnosis not present

## 2016-08-29 DIAGNOSIS — Z8719 Personal history of other diseases of the digestive system: Secondary | ICD-10-CM

## 2016-08-29 DIAGNOSIS — Z7982 Long term (current) use of aspirin: Secondary | ICD-10-CM | POA: Insufficient documentation

## 2016-08-29 DIAGNOSIS — E1165 Type 2 diabetes mellitus with hyperglycemia: Secondary | ICD-10-CM | POA: Diagnosis not present

## 2016-08-29 DIAGNOSIS — Z79899 Other long term (current) drug therapy: Secondary | ICD-10-CM

## 2016-08-29 DIAGNOSIS — Z8041 Family history of malignant neoplasm of ovary: Secondary | ICD-10-CM | POA: Diagnosis not present

## 2016-08-29 DIAGNOSIS — F329 Major depressive disorder, single episode, unspecified: Secondary | ICD-10-CM | POA: Diagnosis not present

## 2016-08-29 DIAGNOSIS — Z8673 Personal history of transient ischemic attack (TIA), and cerebral infarction without residual deficits: Secondary | ICD-10-CM | POA: Diagnosis not present

## 2016-08-29 DIAGNOSIS — D509 Iron deficiency anemia, unspecified: Secondary | ICD-10-CM | POA: Diagnosis not present

## 2016-08-29 DIAGNOSIS — N189 Chronic kidney disease, unspecified: Secondary | ICD-10-CM | POA: Insufficient documentation

## 2016-08-29 DIAGNOSIS — Z170 Estrogen receptor positive status [ER+]: Secondary | ICD-10-CM

## 2016-08-29 DIAGNOSIS — Z87891 Personal history of nicotine dependence: Secondary | ICD-10-CM | POA: Diagnosis not present

## 2016-08-29 DIAGNOSIS — I129 Hypertensive chronic kidney disease with stage 1 through stage 4 chronic kidney disease, or unspecified chronic kidney disease: Secondary | ICD-10-CM | POA: Diagnosis not present

## 2016-08-29 DIAGNOSIS — C50212 Malignant neoplasm of upper-inner quadrant of left female breast: Secondary | ICD-10-CM | POA: Diagnosis not present

## 2016-08-29 LAB — COMPREHENSIVE METABOLIC PANEL
ALT: 13 U/L — AB (ref 14–54)
AST: 16 U/L (ref 15–41)
Albumin: 4.2 g/dL (ref 3.5–5.0)
Alkaline Phosphatase: 62 U/L (ref 38–126)
Anion gap: 5 (ref 5–15)
BILIRUBIN TOTAL: 0.8 mg/dL (ref 0.3–1.2)
BUN: 49 mg/dL — ABNORMAL HIGH (ref 6–20)
CHLORIDE: 103 mmol/L (ref 101–111)
CO2: 26 mmol/L (ref 22–32)
CREATININE: 3.52 mg/dL — AB (ref 0.44–1.00)
Calcium: 9.2 mg/dL (ref 8.9–10.3)
GFR, EST AFRICAN AMERICAN: 15 mL/min — AB (ref >60)
GFR, EST NON AFRICAN AMERICAN: 13 mL/min — AB (ref >60)
Glucose, Bld: 177 mg/dL — ABNORMAL HIGH (ref 65–99)
Potassium: 4.1 mmol/L (ref 3.5–5.1)
Sodium: 134 mmol/L — ABNORMAL LOW (ref 135–145)
Total Protein: 8 g/dL (ref 6.5–8.1)

## 2016-08-29 LAB — CBC WITH DIFFERENTIAL/PLATELET
Basophils Absolute: 0.1 10*3/uL (ref 0–0.1)
Basophils Relative: 3 %
EOS PCT: 1 %
Eosinophils Absolute: 0 10*3/uL (ref 0–0.7)
HEMATOCRIT: 28.1 % — AB (ref 35.0–47.0)
Hemoglobin: 9.8 g/dL — ABNORMAL LOW (ref 12.0–16.0)
LYMPHS ABS: 0.6 10*3/uL — AB (ref 1.0–3.6)
LYMPHS PCT: 27 %
MCH: 35.9 pg — AB (ref 26.0–34.0)
MCHC: 34.9 g/dL (ref 32.0–36.0)
MCV: 102.7 fL — AB (ref 80.0–100.0)
MONO ABS: 0.2 10*3/uL (ref 0.2–0.9)
Monocytes Relative: 8 %
NEUTROS ABS: 1.4 10*3/uL (ref 1.4–6.5)
Neutrophils Relative %: 63 %
PLATELETS: 198 10*3/uL (ref 150–440)
RBC: 2.74 MIL/uL — AB (ref 3.80–5.20)
RDW: 14.2 % (ref 11.5–14.5)
WBC: 2.2 10*3/uL — ABNORMAL LOW (ref 3.6–11.0)

## 2016-08-29 NOTE — Progress Notes (Signed)
[name] OFFICE PROGRESS NOTE  Patient Care Team: Gloria Harrier, [name]   SUMMARY OF ONCOLOGIC HISTORY:  Oncology History   # OCT 2015-STAGE IV LEFT BREAST T2N1 [T=4cm; [name]; [name]; [name]; EBUS- Positive Paratrac/subcarinal LN s/p ? Taxotere [in [place]; [name]; [name];[name]; [name]; suv 3]; [name]; [name]; [name];  [name]; Ant R iliac bone; inferior sternum- not on X-geva  # Poorly controlled Blood sugars- improved.   # Pancreatitis Hx/ CKD IV [creat ~ 2-3]     Carcinoma of upper-inner quadrant of left breast in female, estrogen receptor positive ([name])   05/23/2016 Initial Diagnosis    Carcinoma of upper-inner quadrant of left breast in female, estrogen receptor positive ([name])       INTERVAL HISTORY:  [name] with above history of metastatic left breast cancer currently on second line therapy with ibrance plus Femara is here for follow-up.   [name] denies any unusual shortness of breath or cough.  [name] continues to be off dialysis. Denies any swelling of the legs. Denies any significant weight gain.  No unusual bone pain.  No diarrhea. No swelling in legs. No weight gain or weight loss.  [name] is planning to go to [place] in the next few weeks to visit her grandbaby.   REVIEW OF SYSTEMS:  A complete 10 point review of system is done which is negative except mentioned above/history of present illness.   PAST MEDICAL HISTORY :  Past Medical History:  Diagnosis Date  . Asthma   . CHF (congestive  heart failure) ([place]) 1997  . CKD (chronic kidney disease)   . Depression   . Diabetes mellitus, type 2 ([place])   . Hair loss   . History of left breast cancer 05/29/14  . Obesity   . Pancreatitis 1997  . Stroke [facility]) 2010   with mild left arm weakness    PAST SURGICAL HISTORY :   Past Surgical History:  Procedure Laterality Date  . CESAREAN SECTION    . CHOLECYSTECTOMY      FAMILY HISTORY :   Family History  Problem Relation Age of Onset  . Ovarian cancer Mother   . Diabetes Mother   . Hypertension Mother   . COPD Father   . Hypertension Father   . Diabetes Sister   . Diabetes Brother     SOCIAL HISTORY:   Social History  Substance Use Topics  . Smoking status: Former Smoker    Packs/day: 0.50    Years: 1.00    Types: Cigarettes  . Smokeless tobacco: Never Used  . Alcohol use No    ALLERGIES:  has No Known Allergies.  MEDICATIONS:  Current Outpatient Prescriptions  Medication Sig Dispense Refill  . acetaminophen-codeine (TYLENOL #4) 300-60 MG tablet Take 2 tablets by mouth every 6 (six) hours as needed for moderate pain.     Marland Kitchen albuterol (PROAIR HFA) 108 (90 BASE) MCG/ACT inhaler Inhale 1 puff into the lungs every 6 (six) hours as needed for shortness of breath.     Marland Kitchen albuterol (PROVENTIL) (2.5 MG/3ML) 0.083% nebulizer solution Inhale  2.5 mg into the lungs every 6 (six) hours as needed for shortness of breath.     . ALPRAZolam (XANAX) 0.5 MG tablet Take 0.5 mg by mouth at bedtime as needed for anxiety or sleep.     Marland Kitchen amLODipine (NORVASC) 10 MG tablet Take 10 mg by mouth daily.     Marland Kitchen aspirin EC 81 MG tablet Take 81 mg by mouth once.     Marland Kitchen atenolol (TENORMIN) 50 MG tablet TAKE 1 1/2 TABLETS BY MOUTH TWICE DAILY    . B-D ULTRA-FINE 33 LANCETS MISC Use 1 each 2 (two) times daily.    . B-D ULTRAFINE III SHORT PEN 31G X 8 MM MISC     . bumetanide (BUMEX) 0.5 MG tablet TAKE 1 TABLET BY MOUTH TWICE DAILY    . Cinnamon 500 MG capsule Take 500 mg by mouth daily.     .  cloNIDine (CATAPRES) 0.2 MG tablet Take 0.2 mg by mouth daily.     . enalapril (VASOTEC) 20 MG tablet Take 20 mg by mouth daily.     . ferrous sulfate 325 (65 FE) MG tablet Take 325 mg by mouth 2 (two) times daily with a meal.    . FLUoxetine (PROZAC) 20 MG capsule Take 20 mg by mouth daily.     Marland Kitchen glucose blood (ONE TOUCH ULTRA TEST) test strip Use 1 each 2 (two) times daily. Use as instructed.    . glyBURIDE (DIABETA) 5 MG tablet Take 5 mg by mouth daily with breakfast.     . IBRANCE 125 MG capsule TAKE 1 CAPSULE BY MOUTH ONE TIME DAILY WITH BREAKFAST FOR 3 WEEKS ON FOLLOWED BY 1 WEEK OFF. TAKE WHOLE WITH FOOD. 21 capsule 6  . letrozole (FEMARA) 2.5 MG tablet Take 1 tablet (2.5 mg total) by mouth daily. 90 tablet 3  . LEVEMIR FLEXTOUCH 100 UNIT/ML Pen Inject 48 Units into the skin daily.     Marland Kitchen NOVOLOG FLEXPEN 100 UNIT/ML FlexPen     . salmeterol (SEREVENT) 50 MCG/DOSE diskus inhaler Inhale 1 puff into the lungs 2 (two) times daily.    . simvastatin (ZOCOR) 20 MG tablet Take 20 mg by mouth daily at 6 PM.     . vitamin B-12 (CYANOCOBALAMIN) 1000 MCG tablet Take 1,000 mcg by mouth daily.     No current facility-administered medications for this visit.    Facility-Administered Medications Ordered in Other Visits  Medication Dose Route Frequency Provider Last Rate Last Dose  . sodium chloride flush (NS) 0.9 % injection 10 mL  10 mL Intravenous PRN Cammie Sickle, MD   10 mL at 01/30/16 1054    PHYSICAL EXAMINATION: ECOG PERFORMANCE STATUS: 0 - Asymptomatic  BP 138/88 (BP Location: Left Arm, Patient Position: Sitting)   Pulse 68   Temp 97.3 F (36.3 C) (Tympanic)   Resp 18   Wt 195 lb 2 oz (88.5 kg)   BMI 35.69 kg/m   Filed Weights   08/29/16 1021  Weight: 195 lb 2 oz (88.5 kg)    GENERAL: Well-nourished well-developed; Alert, no distress and comfortable.  [name] is Accompanied with family.  EYES: no pallor or icterus OROPHARYNX: no thrush or ulceration; good dentition  NECK:  supple, no masses felt LYMPH:  no palpable lymphadenopathy in the cervical, axillary or inguinal regions LUNGS: clear to auscultation and  No wheeze or crackles HEART/CVS: regular rate & rhythm and no murmurs; No lower extremity edema ABDOMEN:abdomen soft, non-tender and normal bowel sounds Musculoskeletal:no cyanosis of  digits and no clubbing  PSYCH: alert & oriented x 3 with fluent speech NEURO: no focal motor/sensory deficits SKIN:  no rashes or significant lesions    LABORATORY DATA:  I have reviewed the data as listed    Component Value Date/Time   NA 134 (L) 08/29/2016 0957   NA 130 (L) 06/06/2014 1102   K 4.1 08/29/2016 0957   K 3.9 06/06/2014 1102   CL 103 08/29/2016 0957   CL 95 (L) 06/06/2014 1102   CO2 26 08/29/2016 0957   CO2 28 06/06/2014 1102   GLUCOSE 177 (H) 08/29/2016 0957   GLUCOSE 349 (H) 06/06/2014 1102   BUN 49 (H) 08/29/2016 0957   BUN 17 06/06/2014 1102   CREATININE 3.52 (H) 08/29/2016 0957   CREATININE 1.63 (H) 06/06/2014 1102   CALCIUM 9.2 08/29/2016 0957   CALCIUM 9.2 06/06/2014 1102   PROT 8.0 08/29/2016 0957   PROT 8.2 06/06/2014 1102   ALBUMIN 4.2 08/29/2016 0957   ALBUMIN 3.3 (L) 06/06/2014 1102   AST 16 08/29/2016 0957   AST 7 (L) 06/06/2014 1102   ALT 13 (L) 08/29/2016 0957   ALT 12 (L) 06/06/2014 1102   ALKPHOS 62 08/29/2016 0957   ALKPHOS 74 06/06/2014 1102   BILITOT 0.8 08/29/2016 0957   BILITOT 0.4 06/06/2014 1102   GFRNONAA 13 (L) 08/29/2016 0957   GFRNONAA 35 (L) 06/06/2014 1102   GFRAA 15 (L) 08/29/2016 0957   GFRAA 42 (L) 06/06/2014 1102    No results found for: SPEP, UPEP  Lab Results  Component Value Date   WBC 2.2 (L) 08/29/2016   NEUTROABS 1.4 08/29/2016   HGB 9.8 (L) 08/29/2016   HCT 28.1 (L) 08/29/2016   MCV 102.7 (H) 08/29/2016   PLT 198 08/29/2016      Chemistry      Component Value Date/Time   NA 134 (L) 08/29/2016 0957   NA 130 (L) 06/06/2014 1102   K 4.1 08/29/2016 0957   K 3.9 06/06/2014 1102   CL  103 08/29/2016 0957   CL 95 (L) 06/06/2014 1102   CO2 26 08/29/2016 0957   CO2 28 06/06/2014 1102   BUN 49 (H) 08/29/2016 0957   BUN 17 06/06/2014 1102   CREATININE 3.52 (H) 08/29/2016 0957   CREATININE 1.63 (H) 06/06/2014 1102      Component Value Date/Time   CALCIUM 9.2 08/29/2016 0957   CALCIUM 9.2 06/06/2014 1102   ALKPHOS 62 08/29/2016 0957   ALKPHOS 74 06/06/2014 1102   AST 16 08/29/2016 0957   AST 7 (L) 06/06/2014 1102   ALT 13 (L) 08/29/2016 0957   ALT 12 (L) 06/06/2014 1102   BILITOT 0.8 08/29/2016 0957   BILITOT 0.4 06/06/2014 1102      Results for LURLINE, CAVER A (MRN [id number]) as of 06/20/2016 10:54  Ref. Range 06/06/2014 11:02 03/28/2016 11:25 05/23/2016 10:50  CA 27.29 Latest Ref Range: 0.0 - 38.6 U/mL 58.0 (H) 40.7 (H) 50.4 (H)   ASSESSMENT & PLAN:  Carcinoma of upper-inner quadrant of left breast in female, estrogen receptor positive ([place]) # Left breast cancer stage IV/oligometastatic to the mediastinal lymph nodes currently  with ibrance and Femara since March 2016.  Clinically  NO evidence of progression. DEC 12th 2017- PET- no progression/improved left breast mass/ stable-treated bone lesions.   # Patient will continue Femara with ibrance '125mg'$  3 w-On; 1 W-off [start again on 2nd Feb]. Tolerating without any major side effects. CBC within normal limits except for hemoglobin  9.8.  Clinically no evidence of progression.   # Anemia/ IDA-CKD- Hb 9.8; on PO iron. Dec 2017- not iron deficient.   # CKD Stage IV- creat 3.8/ slightly worse; Stable  # Follow with 4 weeks/MD/port flush.       Cammie Sickle, MD 08/29/2016 1:47 PM

## 2016-08-29 NOTE — Assessment & Plan Note (Addendum)
#   Left breast cancer stage IV/oligometastatic to the mediastinal lymph nodes currently  with ibrance and Femara since March 2016.  Clinically  NO evidence of progression. DEC 12th 2017- PET- no progression/improved left breast mass/ stable-treated bone lesions.   # Patient will continue Femara with ibrance '125mg'$  3 w-On; 1 W-off [start again on 2nd Feb]. Tolerating without any major side effects. CBC within normal limits except for hemoglobin  9.8. Clinically no evidence of progression.   # Anemia/ IDA-CKD- Hb 9.8; on PO iron. Dec 2017- not iron deficient.   # CKD Stage IV- creat 3.8/ slightly worse; Stable  # Follow with 4 weeks/MD/port flush.

## 2016-08-29 NOTE — Progress Notes (Signed)
Patient offers no complaints today.c

## 2016-08-30 LAB — CANCER ANTIGEN 27.29: CA 27.29: 56.8 U/mL — ABNORMAL HIGH (ref 0.0–38.6)

## 2016-09-15 ENCOUNTER — Other Ambulatory Visit: Payer: Self-pay | Admitting: Internal Medicine

## 2016-09-22 DIAGNOSIS — I10 Essential (primary) hypertension: Secondary | ICD-10-CM | POA: Diagnosis not present

## 2016-09-22 DIAGNOSIS — G8929 Other chronic pain: Secondary | ICD-10-CM | POA: Diagnosis not present

## 2016-09-22 DIAGNOSIS — M545 Low back pain: Secondary | ICD-10-CM | POA: Diagnosis not present

## 2016-09-22 DIAGNOSIS — E1121 Type 2 diabetes mellitus with diabetic nephropathy: Secondary | ICD-10-CM | POA: Diagnosis not present

## 2016-09-22 DIAGNOSIS — D649 Anemia, unspecified: Secondary | ICD-10-CM | POA: Diagnosis not present

## 2016-09-22 DIAGNOSIS — C50812 Malignant neoplasm of overlapping sites of left female breast: Secondary | ICD-10-CM | POA: Diagnosis not present

## 2016-09-22 DIAGNOSIS — Z794 Long term (current) use of insulin: Secondary | ICD-10-CM | POA: Diagnosis not present

## 2016-09-22 DIAGNOSIS — E782 Mixed hyperlipidemia: Secondary | ICD-10-CM | POA: Diagnosis not present

## 2016-09-22 DIAGNOSIS — Z0000 Encounter for general adult medical examination without abnormal findings: Secondary | ICD-10-CM | POA: Diagnosis not present

## 2016-09-22 DIAGNOSIS — I5022 Chronic systolic (congestive) heart failure: Secondary | ICD-10-CM | POA: Diagnosis not present

## 2016-09-22 DIAGNOSIS — F3289 Other specified depressive episodes: Secondary | ICD-10-CM | POA: Diagnosis not present

## 2016-09-23 DIAGNOSIS — I10 Essential (primary) hypertension: Secondary | ICD-10-CM | POA: Diagnosis not present

## 2016-09-23 DIAGNOSIS — E1122 Type 2 diabetes mellitus with diabetic chronic kidney disease: Secondary | ICD-10-CM | POA: Diagnosis not present

## 2016-09-23 DIAGNOSIS — I5022 Chronic systolic (congestive) heart failure: Secondary | ICD-10-CM | POA: Diagnosis not present

## 2016-09-23 DIAGNOSIS — E782 Mixed hyperlipidemia: Secondary | ICD-10-CM | POA: Diagnosis not present

## 2016-09-23 DIAGNOSIS — Z0000 Encounter for general adult medical examination without abnormal findings: Secondary | ICD-10-CM | POA: Diagnosis not present

## 2016-09-23 DIAGNOSIS — M25512 Pain in left shoulder: Secondary | ICD-10-CM | POA: Diagnosis not present

## 2016-09-23 DIAGNOSIS — I340 Nonrheumatic mitral (valve) insufficiency: Secondary | ICD-10-CM | POA: Diagnosis not present

## 2016-09-23 DIAGNOSIS — N184 Chronic kidney disease, stage 4 (severe): Secondary | ICD-10-CM | POA: Diagnosis not present

## 2016-09-26 ENCOUNTER — Inpatient Hospital Stay: Payer: Medicare Other

## 2016-09-26 ENCOUNTER — Inpatient Hospital Stay: Payer: Medicare Other | Attending: Internal Medicine

## 2016-09-26 ENCOUNTER — Inpatient Hospital Stay (HOSPITAL_BASED_OUTPATIENT_CLINIC_OR_DEPARTMENT_OTHER): Payer: Medicare Other | Admitting: Internal Medicine

## 2016-09-26 DIAGNOSIS — C50212 Malignant neoplasm of upper-inner quadrant of left female breast: Secondary | ICD-10-CM | POA: Diagnosis not present

## 2016-09-26 DIAGNOSIS — Z8719 Personal history of other diseases of the digestive system: Secondary | ICD-10-CM

## 2016-09-26 DIAGNOSIS — Z87891 Personal history of nicotine dependence: Secondary | ICD-10-CM

## 2016-09-26 DIAGNOSIS — N184 Chronic kidney disease, stage 4 (severe): Secondary | ICD-10-CM

## 2016-09-26 DIAGNOSIS — Z79899 Other long term (current) drug therapy: Secondary | ICD-10-CM | POA: Diagnosis not present

## 2016-09-26 DIAGNOSIS — E1165 Type 2 diabetes mellitus with hyperglycemia: Secondary | ICD-10-CM

## 2016-09-26 DIAGNOSIS — Z9049 Acquired absence of other specified parts of digestive tract: Secondary | ICD-10-CM

## 2016-09-26 DIAGNOSIS — Z7982 Long term (current) use of aspirin: Secondary | ICD-10-CM

## 2016-09-26 DIAGNOSIS — E669 Obesity, unspecified: Secondary | ICD-10-CM | POA: Diagnosis not present

## 2016-09-26 DIAGNOSIS — F329 Major depressive disorder, single episode, unspecified: Secondary | ICD-10-CM

## 2016-09-26 DIAGNOSIS — Z79811 Long term (current) use of aromatase inhibitors: Secondary | ICD-10-CM

## 2016-09-26 DIAGNOSIS — I509 Heart failure, unspecified: Secondary | ICD-10-CM | POA: Insufficient documentation

## 2016-09-26 DIAGNOSIS — Z9221 Personal history of antineoplastic chemotherapy: Secondary | ICD-10-CM

## 2016-09-26 DIAGNOSIS — Z170 Estrogen receptor positive status [ER+]: Secondary | ICD-10-CM

## 2016-09-26 DIAGNOSIS — D509 Iron deficiency anemia, unspecified: Secondary | ICD-10-CM

## 2016-09-26 DIAGNOSIS — Z8673 Personal history of transient ischemic attack (TIA), and cerebral infarction without residual deficits: Secondary | ICD-10-CM | POA: Insufficient documentation

## 2016-09-26 DIAGNOSIS — C7951 Secondary malignant neoplasm of bone: Secondary | ICD-10-CM | POA: Insufficient documentation

## 2016-09-26 DIAGNOSIS — Z8041 Family history of malignant neoplasm of ovary: Secondary | ICD-10-CM

## 2016-09-26 DIAGNOSIS — I129 Hypertensive chronic kidney disease with stage 1 through stage 4 chronic kidney disease, or unspecified chronic kidney disease: Secondary | ICD-10-CM

## 2016-09-26 DIAGNOSIS — Z7984 Long term (current) use of oral hypoglycemic drugs: Secondary | ICD-10-CM | POA: Insufficient documentation

## 2016-09-26 DIAGNOSIS — J45909 Unspecified asthma, uncomplicated: Secondary | ICD-10-CM | POA: Diagnosis not present

## 2016-09-26 DIAGNOSIS — Z95828 Presence of other vascular implants and grafts: Secondary | ICD-10-CM

## 2016-09-26 LAB — CBC WITH DIFFERENTIAL/PLATELET
BASOS PCT: 2 %
Basophils Absolute: 0.1 10*3/uL (ref 0–0.1)
EOS ABS: 0 10*3/uL (ref 0–0.7)
EOS PCT: 1 %
HCT: 26.6 % — ABNORMAL LOW (ref 35.0–47.0)
HEMOGLOBIN: 9.5 g/dL — AB (ref 12.0–16.0)
Lymphocytes Relative: 34 %
Lymphs Abs: 0.9 10*3/uL — ABNORMAL LOW (ref 1.0–3.6)
MCH: 36.7 pg — ABNORMAL HIGH (ref 26.0–34.0)
MCHC: 35.5 g/dL (ref 32.0–36.0)
MCV: 103.1 fL — ABNORMAL HIGH (ref 80.0–100.0)
MONOS PCT: 8 %
Monocytes Absolute: 0.2 10*3/uL (ref 0.2–0.9)
NEUTROS PCT: 55 %
Neutro Abs: 1.5 10*3/uL (ref 1.4–6.5)
PLATELETS: 268 10*3/uL (ref 150–440)
RBC: 2.58 MIL/uL — ABNORMAL LOW (ref 3.80–5.20)
RDW: 13.9 % (ref 11.5–14.5)
WBC: 2.8 10*3/uL — ABNORMAL LOW (ref 3.6–11.0)

## 2016-09-26 LAB — COMPREHENSIVE METABOLIC PANEL
ALBUMIN: 4.2 g/dL (ref 3.5–5.0)
ALT: 16 U/L (ref 14–54)
ANION GAP: 9 (ref 5–15)
AST: 19 U/L (ref 15–41)
Alkaline Phosphatase: 56 U/L (ref 38–126)
BUN: 58 mg/dL — ABNORMAL HIGH (ref 6–20)
CHLORIDE: 106 mmol/L (ref 101–111)
CO2: 19 mmol/L — AB (ref 22–32)
Calcium: 9 mg/dL (ref 8.9–10.3)
Creatinine, Ser: 4.11 mg/dL — ABNORMAL HIGH (ref 0.44–1.00)
GFR calc non Af Amer: 11 mL/min — ABNORMAL LOW (ref >60)
GFR, EST AFRICAN AMERICAN: 13 mL/min — AB (ref >60)
GLUCOSE: 149 mg/dL — AB (ref 65–99)
POTASSIUM: 4.2 mmol/L (ref 3.5–5.1)
SODIUM: 134 mmol/L — AB (ref 135–145)
Total Bilirubin: 0.6 mg/dL (ref 0.3–1.2)
Total Protein: 8 g/dL (ref 6.5–8.1)

## 2016-09-26 MED ORDER — SODIUM CHLORIDE 0.9% FLUSH
10.0000 mL | INTRAVENOUS | Status: DC | PRN
  Administered 2016-09-26: 10 mL via INTRAVENOUS
  Filled 2016-09-26: qty 10

## 2016-09-26 MED ORDER — HEPARIN SOD (PORK) LOCK FLUSH 100 UNIT/ML IV SOLN
500.0000 [IU] | Freq: Once | INTRAVENOUS | Status: AC
  Administered 2016-09-26: 500 [IU] via INTRAVENOUS

## 2016-09-26 MED ORDER — HEPARIN SOD (PORK) LOCK FLUSH 100 UNIT/ML IV SOLN
INTRAVENOUS | Status: AC
  Filled 2016-09-26: qty 5

## 2016-09-26 NOTE — Assessment & Plan Note (Addendum)
#   Left breast cancer stage IV/oligometastatic to the mediastinal lymph nodes currently  with ibrance and Femara since March 2016.  Clinically  NO evidence of progression. DEC 12th 2017- PET- no progression/improved left breast mass/ stable-treated bone lesions.   # Patient will continue Femara with ibrance '125mg'$  3 w-On; 1 W-off [start again on 2nd Mar]. Tolerating without any major side effects. CBC within normal limits except for hemoglobin 9.5. Clinically no evidence of progression.   # Anemia/ IDA-CKD- Hb 9.5; on PO iron and SL B-12. Dec 2017- not iron deficient.   # CKD Stage IV- creat 4.1/ worsening. Being followed by Dr. Juleen China, nephrology.  # Follow-up in 4 weeks: labs (CBC, CMP)/MD; 8 weeks: labs/MD/port flush

## 2016-09-26 NOTE — Progress Notes (Signed)
Patient started her week off today with Ibrance. Last pill taken yesterday. Patient has not missed any dosing of Ibrance 125 mg capsule.  Patient takes along with Femara. Has not missed any dosing of Femara.

## 2016-09-26 NOTE — Progress Notes (Signed)
[name] OFFICE PROGRESS NOTE  Patient Care Team: [name], [name]   SUMMARY OF ONCOLOGIC HISTORY:  Oncology History   # OCT 2015-STAGE IV LEFT BREAST T2N1 [T=4cm; [name]; [name]; [name]; EBUS- Positive Paratrac/subcarinal LN s/p ? Taxotere [in [place]; [name]; [name];[name]; [name]; suv 3]; [name]; [name]; [name];  [name]; Ant R iliac bone; inferior sternum- not on X-geva  # Poorly controlled Blood sugars- improved.   # Pancreatitis Hx/ CKD IV [creat ~ 2-3]     Carcinoma of upper-inner quadrant of left breast in female, estrogen receptor positive ([place])   05/23/2016 Initial Diagnosis    Carcinoma of upper-inner quadrant of left breast in female, estrogen receptor positive ([place])       INTERVAL HISTORY:  [name] with above history of metastatic left breast cancer currently on second line therapy with ibrance plus Femara is here for follow-up.   She denies any unusual shortness of breath or cough.  She continues to be off dialysis. Denies any swelling of the legs.  Reports good appetite, denies any weight change.  No unusual bone pain.  No diarrhea.  No fatigue.  No insomnia.  Her granddaughter was born today in [place], and she plans to travel there next week for a one-week stay to help out.    REVIEW OF SYSTEMS:  A complete 10 point review of system is done which is negative except mentioned above/history of present illness.   PAST MEDICAL HISTORY :  Past Medical History:  Diagnosis Date  .  Asthma   . CHF (congestive heart failure) ([place]) 1997  . CKD (chronic kidney disease)   . Depression   . Diabetes mellitus, type 2 ([place])   . Hair loss   . History of left breast cancer 05/29/14  . Obesity   . Pancreatitis 1997  . Stroke [facility]) 2010   with mild left arm weakness    PAST SURGICAL HISTORY :   Past Surgical History:  Procedure Laterality Date  . CESAREAN SECTION    . CHOLECYSTECTOMY      FAMILY HISTORY :   Family History  Problem Relation Age of Onset  . Ovarian cancer Mother   . Diabetes Mother   . Hypertension Mother   . COPD Father   . Hypertension Father   . Diabetes Sister   . Diabetes Brother     SOCIAL HISTORY:   Social History  Substance Use Topics  . Smoking status: Former Smoker    Packs/day: 0.50    Years: 1.00    Types: Cigarettes  . Smokeless tobacco: Never Used  . Alcohol use No    ALLERGIES:  has No Known Allergies.  MEDICATIONS:  Current Outpatient Prescriptions  Medication Sig Dispense Refill  . acetaminophen-codeine (TYLENOL #4) 300-60 MG tablet Take 2 tablets by mouth every 6 (six) hours as needed for moderate pain.     Marland Kitchen albuterol (PROAIR HFA) 108 (90 BASE) MCG/ACT inhaler Inhale 1 puff into the lungs every 6 (six) hours as needed for shortness of breath.     Marland Kitchen albuterol (PROVENTIL) (  2.5 MG/3ML) 0.083% nebulizer solution Inhale 2.5 mg into the lungs every 6 (six) hours as needed for shortness of breath.     . ALPRAZolam (XANAX) 0.5 MG tablet Take 0.5 mg by mouth at bedtime as needed for anxiety or sleep.     Marland Kitchen amLODipine (NORVASC) 10 MG tablet Take 10 mg by mouth daily.     Marland Kitchen aspirin EC 81 MG tablet Take 81 mg by mouth once.     Marland Kitchen atenolol (TENORMIN) 50 MG tablet TAKE 1 1/2 TABLETS BY MOUTH TWICE DAILY    . B-D ULTRA-FINE 33 LANCETS MISC Use 1 each 2 (two) times daily.    . B-D ULTRAFINE III SHORT PEN 31G X 8 MM MISC     . bumetanide (BUMEX) 0.5 MG tablet TAKE 1 TABLET BY MOUTH TWICE DAILY    . Cinnamon 500 MG capsule Take 500  mg by mouth daily.     . cloNIDine (CATAPRES) 0.2 MG tablet Take 0.2 mg by mouth daily.     . enalapril (VASOTEC) 20 MG tablet Take 20 mg by mouth daily.     . ferrous sulfate 325 (65 FE) MG tablet Take 325 mg by mouth 2 (two) times daily with a meal.    . FLUoxetine (PROZAC) 20 MG capsule Take 20 mg by mouth daily.     Marland Kitchen glucose blood (ONE TOUCH ULTRA TEST) test strip Use 1 each 2 (two) times daily. Use as instructed.    . glyBURIDE (DIABETA) 5 MG tablet Take 5 mg by mouth daily with breakfast.     . IBRANCE 125 MG capsule TAKE 1 CAPSULE BY MOUTH ONE TIME DAILY WITH BREAKFAST FOR 3 WEEKS ON FOLLOWED BY 1 WEEK OFF. TAKE WHOLE WITH FOOD. 21 capsule 6  . letrozole (FEMARA) 2.5 MG tablet Take 1 tablet (2.5 mg total) by mouth daily. 90 tablet 3  . LEVEMIR FLEXTOUCH 100 UNIT/ML Pen Inject 48 Units into the skin daily.     Marland Kitchen NOVOLOG FLEXPEN 100 UNIT/ML FlexPen Inject 10 Units into the skin 2 (two) times daily at 8 am and 10 pm.     . salmeterol (SEREVENT) 50 MCG/DOSE diskus inhaler Inhale 1 puff into the lungs 2 (two) times daily.    . simvastatin (ZOCOR) 20 MG tablet Take 20 mg by mouth daily at 6 PM.     . vitamin B-12 (CYANOCOBALAMIN) 1000 MCG tablet Take 1,000 mcg by mouth daily.     No current facility-administered medications for this visit.    Facility-Administered Medications Ordered in Other Visits  Medication Dose Route Frequency Provider Last Rate Last Dose  . sodium chloride flush (NS) 0.9 % injection 10 mL  10 mL Intravenous PRN Cammie Sickle, MD   10 mL at 01/30/16 1054    PHYSICAL EXAMINATION: ECOG PERFORMANCE STATUS: 0 - Asymptomatic  BP 134/83 (BP Location: Right Arm, Patient Position: Sitting)   Pulse 68   Temp 97.8 F (36.6 C) (Tympanic)   Resp 18   Ht '5\' 2"'$  (1.575 m)   Wt 196 lb (88.9 kg)   BMI 35.85 kg/m   Filed Weights   09/26/16 1149  Weight: 196 lb (88.9 kg)    GENERAL: Well-nourished well-developed; Alert, no distress and comfortable.  She is  Accompanied with family.  EYES: no pallor or icterus OROPHARYNX: no thrush or ulceration; good dentition  NECK: supple, no masses felt LYMPH:  no palpable lymphadenopathy in the cervical, axillary or inguinal regions LUNGS: clear to auscultation and  No  wheeze or crackles HEART/CVS: regular rate & rhythm and no murmurs; No lower extremity edema ABDOMEN:abdomen soft, non-tender and normal bowel sounds Musculoskeletal:no cyanosis of digits and no clubbing  PSYCH: alert & oriented x 3 with fluent speech NEURO: no focal motor/sensory deficits SKIN:  no rashes or significant lesions    LABORATORY DATA:  I have reviewed the data as listed    Component Value Date/Time   NA 134 (L) 09/26/2016 0935   NA 130 (L) 06/06/2014 1102   K 4.2 09/26/2016 0935   K 3.9 06/06/2014 1102   CL 106 09/26/2016 0935   CL 95 (L) 06/06/2014 1102   CO2 19 (L) 09/26/2016 0935   CO2 28 06/06/2014 1102   GLUCOSE 149 (H) 09/26/2016 0935   GLUCOSE 349 (H) 06/06/2014 1102   BUN 58 (H) 09/26/2016 0935   BUN 17 06/06/2014 1102   CREATININE 4.11 (H) 09/26/2016 0935   CREATININE 1.63 (H) 06/06/2014 1102   CALCIUM 9.0 09/26/2016 0935   CALCIUM 9.2 06/06/2014 1102   PROT 8.0 09/26/2016 0935   PROT 8.2 06/06/2014 1102   ALBUMIN 4.2 09/26/2016 0935   ALBUMIN 3.3 (L) 06/06/2014 1102   AST 19 09/26/2016 0935   AST 7 (L) 06/06/2014 1102   ALT 16 09/26/2016 0935   ALT 12 (L) 06/06/2014 1102   ALKPHOS 56 09/26/2016 0935   ALKPHOS 74 06/06/2014 1102   BILITOT 0.6 09/26/2016 0935   BILITOT 0.4 06/06/2014 1102   GFRNONAA 11 (L) 09/26/2016 0935   GFRNONAA 35 (L) 06/06/2014 1102   GFRAA 13 (L) 09/26/2016 0935   GFRAA 42 (L) 06/06/2014 1102    No results found for: SPEP, UPEP  Lab Results  Component Value Date   WBC 2.8 (L) 09/26/2016   NEUTROABS 1.5 09/26/2016   HGB 9.5 (L) 09/26/2016   HCT 26.6 (L) 09/26/2016   MCV 103.1 (H) 09/26/2016   PLT 268 09/26/2016      Chemistry      Component Value Date/Time    NA 134 (L) 09/26/2016 0935   NA 130 (L) 06/06/2014 1102   K 4.2 09/26/2016 0935   K 3.9 06/06/2014 1102   CL 106 09/26/2016 0935   CL 95 (L) 06/06/2014 1102   CO2 19 (L) 09/26/2016 0935   CO2 28 06/06/2014 1102   BUN 58 (H) 09/26/2016 0935   BUN 17 06/06/2014 1102   CREATININE 4.11 (H) 09/26/2016 0935   CREATININE 1.63 (H) 06/06/2014 1102      Component Value Date/Time   CALCIUM 9.0 09/26/2016 0935   CALCIUM 9.2 06/06/2014 1102   ALKPHOS 56 09/26/2016 0935   ALKPHOS 74 06/06/2014 1102   AST 19 09/26/2016 0935   AST 7 (L) 06/06/2014 1102   ALT 16 09/26/2016 0935   ALT 12 (L) 06/06/2014 1102   BILITOT 0.6 09/26/2016 0935   BILITOT 0.4 06/06/2014 1102      Results for SOMA, BACHAND A (MRN [id number]) as of 06/20/2016 10:54  Ref. Range 06/06/2014 11:02 03/28/2016 11:25 05/23/2016 10:50  CA 27.29 Latest Ref Range: 0.0 - 38.6 U/mL 58.0 (H) 40.7 (H) 50.4 (H)   ASSESSMENT & PLAN:  Carcinoma of upper-inner quadrant of left breast in female, estrogen receptor positive ([place]) # Left breast cancer stage IV/oligometastatic to the mediastinal lymph nodes currently  with ibrance and Femara since March 2016.  Clinically  NO evidence of progression. DEC 12th 2017- PET- no progression/improved left breast mass/ stable-treated bone lesions.   # Patient will continue Femara with ibrance  $'125mg'H$  3 w-On; 1 Marcell Anger [start again on 2nd Mar]. Tolerating without any major side effects. CBC within normal limits except for hemoglobin 9.5. Clinically no evidence of progression.   # Anemia/ IDA-CKD- Hb 9.5; on PO iron and SL B-12. Dec 2017- not iron deficient.   # CKD Stage IV- creat 4.1/ worsening. Being followed by Dr. Juleen China, nephrology.  # Follow-up in 4 weeks: labs (CBC, CMP)/MD; 8 weeks: labs/MD/port flush       Lucendia Herrlich, NP 09/26/2016 12:35 PM

## 2016-09-27 LAB — CANCER ANTIGEN 27.29: CA 27.29: 52.8 U/mL — ABNORMAL HIGH (ref 0.0–38.6)

## 2016-10-24 ENCOUNTER — Inpatient Hospital Stay (HOSPITAL_BASED_OUTPATIENT_CLINIC_OR_DEPARTMENT_OTHER): Payer: Medicare Other | Admitting: Internal Medicine

## 2016-10-24 ENCOUNTER — Inpatient Hospital Stay: Payer: Medicare Other | Attending: Internal Medicine

## 2016-10-24 VITALS — BP 137/78 | HR 65 | Temp 96.6°F | Resp 16 | Wt 199.2 lb

## 2016-10-24 DIAGNOSIS — Z87891 Personal history of nicotine dependence: Secondary | ICD-10-CM

## 2016-10-24 DIAGNOSIS — I509 Heart failure, unspecified: Secondary | ICD-10-CM | POA: Insufficient documentation

## 2016-10-24 DIAGNOSIS — Z8673 Personal history of transient ischemic attack (TIA), and cerebral infarction without residual deficits: Secondary | ICD-10-CM | POA: Insufficient documentation

## 2016-10-24 DIAGNOSIS — C7951 Secondary malignant neoplasm of bone: Secondary | ICD-10-CM

## 2016-10-24 DIAGNOSIS — F329 Major depressive disorder, single episode, unspecified: Secondary | ICD-10-CM | POA: Diagnosis not present

## 2016-10-24 DIAGNOSIS — Z79899 Other long term (current) drug therapy: Secondary | ICD-10-CM | POA: Diagnosis not present

## 2016-10-24 DIAGNOSIS — J45909 Unspecified asthma, uncomplicated: Secondary | ICD-10-CM | POA: Diagnosis not present

## 2016-10-24 DIAGNOSIS — C50212 Malignant neoplasm of upper-inner quadrant of left female breast: Secondary | ICD-10-CM | POA: Diagnosis not present

## 2016-10-24 DIAGNOSIS — Z170 Estrogen receptor positive status [ER+]: Secondary | ICD-10-CM | POA: Diagnosis not present

## 2016-10-24 DIAGNOSIS — E1165 Type 2 diabetes mellitus with hyperglycemia: Secondary | ICD-10-CM

## 2016-10-24 DIAGNOSIS — Z8719 Personal history of other diseases of the digestive system: Secondary | ICD-10-CM | POA: Insufficient documentation

## 2016-10-24 DIAGNOSIS — N184 Chronic kidney disease, stage 4 (severe): Secondary | ICD-10-CM | POA: Diagnosis not present

## 2016-10-24 DIAGNOSIS — Z79811 Long term (current) use of aromatase inhibitors: Secondary | ICD-10-CM | POA: Insufficient documentation

## 2016-10-24 DIAGNOSIS — Z7982 Long term (current) use of aspirin: Secondary | ICD-10-CM | POA: Diagnosis not present

## 2016-10-24 DIAGNOSIS — I129 Hypertensive chronic kidney disease with stage 1 through stage 4 chronic kidney disease, or unspecified chronic kidney disease: Secondary | ICD-10-CM

## 2016-10-24 DIAGNOSIS — E669 Obesity, unspecified: Secondary | ICD-10-CM

## 2016-10-24 DIAGNOSIS — Z8041 Family history of malignant neoplasm of ovary: Secondary | ICD-10-CM

## 2016-10-24 LAB — CBC WITH DIFFERENTIAL/PLATELET
BASOS ABS: 0.1 10*3/uL (ref 0–0.1)
BASOS PCT: 2 %
EOS PCT: 1 %
Eosinophils Absolute: 0 10*3/uL (ref 0–0.7)
HCT: 28.4 % — ABNORMAL LOW (ref 35.0–47.0)
Hemoglobin: 10.1 g/dL — ABNORMAL LOW (ref 12.0–16.0)
Lymphocytes Relative: 32 %
Lymphs Abs: 0.7 10*3/uL — ABNORMAL LOW (ref 1.0–3.6)
MCH: 36.7 pg — ABNORMAL HIGH (ref 26.0–34.0)
MCHC: 35.3 g/dL (ref 32.0–36.0)
MCV: 103.8 fL — ABNORMAL HIGH (ref 80.0–100.0)
MONO ABS: 0.2 10*3/uL (ref 0.2–0.9)
Monocytes Relative: 7 %
NEUTROS ABS: 1.3 10*3/uL — AB (ref 1.4–6.5)
Neutrophils Relative %: 58 %
PLATELETS: 263 10*3/uL (ref 150–440)
RBC: 2.74 MIL/uL — ABNORMAL LOW (ref 3.80–5.20)
RDW: 13.4 % (ref 11.5–14.5)
WBC: 2.3 10*3/uL — ABNORMAL LOW (ref 3.6–11.0)

## 2016-10-24 LAB — COMPREHENSIVE METABOLIC PANEL
ALBUMIN: 4.1 g/dL (ref 3.5–5.0)
ALK PHOS: 56 U/L (ref 38–126)
ALT: 15 U/L (ref 14–54)
AST: 18 U/L (ref 15–41)
Anion gap: 7 (ref 5–15)
BILIRUBIN TOTAL: 0.5 mg/dL (ref 0.3–1.2)
BUN: 47 mg/dL — AB (ref 6–20)
CALCIUM: 9.2 mg/dL (ref 8.9–10.3)
CO2: 27 mmol/L (ref 22–32)
Chloride: 102 mmol/L (ref 101–111)
Creatinine, Ser: 3.87 mg/dL — ABNORMAL HIGH (ref 0.44–1.00)
GFR calc Af Amer: 14 mL/min — ABNORMAL LOW (ref >60)
GFR calc non Af Amer: 12 mL/min — ABNORMAL LOW (ref >60)
GLUCOSE: 182 mg/dL — AB (ref 65–99)
Potassium: 3.8 mmol/L (ref 3.5–5.1)
Sodium: 136 mmol/L (ref 135–145)
TOTAL PROTEIN: 7.8 g/dL (ref 6.5–8.1)

## 2016-10-24 NOTE — Assessment & Plan Note (Addendum)
#   Left breast cancer stage IV/oligometastatic to the mediastinal lymph nodes currently  with ibrance and Femara since March 2016.  Clinically  NO evidence of progression. DEC 12th 2017- PET- no progression/improved left breast mass/ stable-treated bone lesions.   # Patient will continue Femara with ibrance '125mg'$  3 w-On; 1 W-off [start again on 2nd april]. Tolerating without any major side effects. CBC within normal limits except for hemoglobin 10.  Clinically no evidence of progression.   # Anemia/ IDA-CKD- Hb 10;  on PO iron and SL B-12. Dec 2017- not iron deficient.   # CKD Stage IV- creat 3.8/ stable. appt   # Follow-up in 4 weeks: labs (CBC, CMP)/MD; 8 weeks: labs/MD/port flush will order PET scan at visit.

## 2016-10-24 NOTE — Progress Notes (Signed)
[name] OFFICE PROGRESS NOTE  Patient Care Team: [name], [name]   SUMMARY OF ONCOLOGIC HISTORY:  Oncology History   # OCT 2015-STAGE IV LEFT BREAST T2N1 [T=4cm; [name]; [name]; [name]; EBUS- Positive Paratrac/subcarinal LN s/p ? Taxotere [in [place]; [name]; [name];[name]; [name]; suv 3]; [name]; [name]; [name];  [name]; Ant R iliac bone; inferior sternum- not on X-geva  # Poorly controlled Blood sugars- improved.   # Pancreatitis Hx/ CKD IV [creat ~ 2-3]     Carcinoma of upper-inner quadrant of left breast in female, estrogen receptor positive ([place])   05/23/2016 Initial Diagnosis    Carcinoma of upper-inner quadrant of left breast in female, estrogen receptor positive ([place])       INTERVAL HISTORY:  [name] with above history of metastatic left breast cancer currently on second line therapy with ibrance plus Femara is here for follow-up.   In the interim she has visited her daughter/granddaughter in [place]. The trip was uneventful.  She denies any unusual shortness of breath or cough. Denies any swelling of the legs. Denies any significant weight gain.  No unusual bone pain.  No diarrhea. No swelling in legs. No weight gain or weight loss.    REVIEW OF SYSTEMS:  A complete 10 point review of system is done which is negative except mentioned above/history of present illness.   PAST MEDICAL HISTORY :  Past Medical History:  Diagnosis Date  . Asthma   . CHF (congestive heart failure)  ([place]) 1997  . CKD (chronic kidney disease)   . Depression   . Diabetes mellitus, type 2 ([place])   . Hair loss   . History of left breast cancer 05/29/14  . Obesity   . Pancreatitis 1997  . Stroke [facility]) 2010   with mild left arm weakness    PAST SURGICAL HISTORY :   Past Surgical History:  Procedure Laterality Date  . CESAREAN SECTION    . CHOLECYSTECTOMY      FAMILY HISTORY :   Family History  Problem Relation Age of Onset  . Ovarian cancer Mother   . Diabetes Mother   . Hypertension Mother   . COPD Father   . Hypertension Father   . Diabetes Sister   . Diabetes Brother     SOCIAL HISTORY:   Social History  Substance Use Topics  . Smoking status: Former Smoker    Packs/day: 0.50    Years: 1.00    Types: Cigarettes  . Smokeless tobacco: Never Used  . Alcohol use No    ALLERGIES:  has No Known Allergies.  MEDICATIONS:  Current Outpatient Prescriptions  Medication Sig Dispense Refill  . acetaminophen-codeine (TYLENOL #4) 300-60 MG tablet Take 2 tablets by mouth every 6 (six) hours as needed for moderate pain.     Marland Kitchen albuterol (PROAIR HFA) 108 (90 BASE) MCG/ACT inhaler Inhale 1 puff into the lungs every 6 (six) hours as needed for shortness of breath.     Marland Kitchen albuterol (PROVENTIL) (2.5 MG/3ML) 0.083% nebulizer solution Inhale 2.5 mg into the lungs every 6 (six)  hours as needed for shortness of breath.     . ALPRAZolam (XANAX) 0.5 MG tablet Take 0.5 mg by mouth at bedtime as needed for anxiety or sleep.     Marland Kitchen amLODipine (NORVASC) 10 MG tablet Take 10 mg by mouth daily.     Marland Kitchen aspirin EC 81 MG tablet Take 81 mg by mouth once.     Marland Kitchen atenolol (TENORMIN) 50 MG tablet TAKE 1 1/2 TABLETS BY MOUTH TWICE DAILY    . B-D ULTRA-FINE 33 LANCETS MISC Use 1 each 2 (two) times daily.    . B-D ULTRAFINE III SHORT PEN 31G X 8 MM MISC     . bumetanide (BUMEX) 0.5 MG tablet TAKE 1 TABLET BY MOUTH TWICE DAILY    . calcitRIOL (ROCALTROL) 0.25 MCG capsule Take 0.25 mcg by mouth 3 (three)  times a week.    . Cinnamon 500 MG capsule Take 500 mg by mouth daily.     . cloNIDine (CATAPRES) 0.2 MG tablet Take 0.2 mg by mouth daily.     . enalapril (VASOTEC) 20 MG tablet Take 20 mg by mouth daily.     . ferrous sulfate 325 (65 FE) MG tablet Take 325 mg by mouth 2 (two) times daily with a meal.    . FLUoxetine (PROZAC) 20 MG capsule Take 20 mg by mouth daily.     Marland Kitchen glucose blood (ONE TOUCH ULTRA TEST) test strip Use 1 each 2 (two) times daily. Use as instructed.    . glyBURIDE (DIABETA) 5 MG tablet Take 5 mg by mouth daily with breakfast.     . IBRANCE 125 MG capsule TAKE 1 CAPSULE BY MOUTH ONE TIME DAILY WITH BREAKFAST FOR 3 WEEKS ON FOLLOWED BY 1 WEEK OFF. TAKE WHOLE WITH FOOD. 21 capsule 6  . letrozole (FEMARA) 2.5 MG tablet Take 1 tablet (2.5 mg total) by mouth daily. 90 tablet 3  . LEVEMIR FLEXTOUCH 100 UNIT/ML Pen Inject 48 Units into the skin daily.     . mometasone (NASONEX) 50 MCG/ACT nasal spray Place into the nose.    Marland Kitchen NOVOLOG FLEXPEN 100 UNIT/ML FlexPen Inject 10 Units into the skin 2 (two) times daily at 8 am and 10 pm.     . salmeterol (SEREVENT) 50 MCG/DOSE diskus inhaler Inhale 1 puff into the lungs 2 (two) times daily.    . simvastatin (ZOCOR) 20 MG tablet Take 20 mg by mouth daily at 6 PM.     . vitamin B-12 (CYANOCOBALAMIN) 1000 MCG tablet Take 1,000 mcg by mouth daily.     No current facility-administered medications for this visit.    Facility-Administered Medications Ordered in Other Visits  Medication Dose Route Frequency Provider Last Rate Last Dose  . sodium chloride flush (NS) 0.9 % injection 10 mL  10 mL Intravenous PRN Cammie Sickle, MD   10 mL at 01/30/16 1054    PHYSICAL EXAMINATION: ECOG PERFORMANCE STATUS: 0 - Asymptomatic  BP 137/78 (BP Location: Right Arm, Patient Position: Sitting)   Pulse 65   Temp (!) 96.6 F (35.9 C) (Tympanic)   Resp 16   Wt 199 lb 3 oz (90.4 kg)   BMI 36.43 kg/m   Filed Weights   10/24/16 0856  Weight: 199  lb 3 oz (90.4 kg)    GENERAL: Well-nourished well-developed; Alert, no distress and comfortable.  She is Accompanied with family.  EYES: no pallor or icterus OROPHARYNX: no thrush or ulceration; good dentition  NECK: supple, no masses felt LYMPH:  no palpable lymphadenopathy in the cervical, axillary or inguinal regions LUNGS: clear to auscultation and  No wheeze or crackles HEART/CVS: regular rate & rhythm and no murmurs; No lower extremity edema ABDOMEN:abdomen soft, non-tender and normal bowel sounds Musculoskeletal:no cyanosis of digits and no clubbing  PSYCH: alert & oriented x 3 with fluent speech NEURO: no focal motor/sensory deficits SKIN:  no rashes or significant lesions    LABORATORY DATA:  I have reviewed the data as listed    Component Value Date/Time   NA 136 10/24/2016 0757   NA 130 (L) 06/06/2014 1102   K 3.8 10/24/2016 0757   K 3.9 06/06/2014 1102   CL 102 10/24/2016 0757   CL 95 (L) 06/06/2014 1102   CO2 27 10/24/2016 0757   CO2 28 06/06/2014 1102   GLUCOSE 182 (H) 10/24/2016 0757   GLUCOSE 349 (H) 06/06/2014 1102   BUN 47 (H) 10/24/2016 0757   BUN 17 06/06/2014 1102   CREATININE 3.87 (H) 10/24/2016 0757   CREATININE 1.63 (H) 06/06/2014 1102   CALCIUM 9.2 10/24/2016 0757   CALCIUM 9.2 06/06/2014 1102   PROT 7.8 10/24/2016 0757   PROT 8.2 06/06/2014 1102   ALBUMIN 4.1 10/24/2016 0757   ALBUMIN 3.3 (L) 06/06/2014 1102   AST 18 10/24/2016 0757   AST 7 (L) 06/06/2014 1102   ALT 15 10/24/2016 0757   ALT 12 (L) 06/06/2014 1102   ALKPHOS 56 10/24/2016 0757   ALKPHOS 74 06/06/2014 1102   BILITOT 0.5 10/24/2016 0757   BILITOT 0.4 06/06/2014 1102   GFRNONAA 12 (L) 10/24/2016 0757   GFRNONAA 35 (L) 06/06/2014 1102   GFRAA 14 (L) 10/24/2016 0757   GFRAA 42 (L) 06/06/2014 1102    No results found for: SPEP, UPEP  Lab Results  Component Value Date   WBC 2.3 (L) 10/24/2016   NEUTROABS 1.3 (L) 10/24/2016   HGB 10.1 (L) 10/24/2016   HCT 28.4 (L)  10/24/2016   MCV 103.8 (H) 10/24/2016   PLT 263 10/24/2016      Chemistry      Component Value Date/Time   NA 136 10/24/2016 0757   NA 130 (L) 06/06/2014 1102   K 3.8 10/24/2016 0757   K 3.9 06/06/2014 1102   CL 102 10/24/2016 0757   CL 95 (L) 06/06/2014 1102   CO2 27 10/24/2016 0757   CO2 28 06/06/2014 1102   BUN 47 (H) 10/24/2016 0757   BUN 17 06/06/2014 1102   CREATININE 3.87 (H) 10/24/2016 0757   CREATININE 1.63 (H) 06/06/2014 1102      Component Value Date/Time   CALCIUM 9.2 10/24/2016 0757   CALCIUM 9.2 06/06/2014 1102   ALKPHOS 56 10/24/2016 0757   ALKPHOS 74 06/06/2014 1102   AST 18 10/24/2016 0757   AST 7 (L) 06/06/2014 1102   ALT 15 10/24/2016 0757   ALT 12 (L) 06/06/2014 1102   BILITOT 0.5 10/24/2016 0757   BILITOT 0.4 06/06/2014 1102      Results for Gloria, [name] A (MRN [id number]) as of 06/20/2016 10:54  Ref. Range 06/06/2014 11:02 03/28/2016 11:25 05/23/2016 10:50  CA 27.29 Latest Ref Range: 0.0 - 38.6 U/mL 58.0 (H) 40.7 (H) 50.4 (H)   ASSESSMENT & PLAN:  Carcinoma of upper-inner quadrant of left breast in female, estrogen receptor positive ([place]) # Left breast cancer stage IV/oligometastatic to the mediastinal lymph nodes currently  with ibrance and Femara since March 2016.  Clinically  NO evidence of progression. DEC 12th 2017- PET- no progression/improved left  breast mass/ stable-treated bone lesions.   # Patient will continue Femara with ibrance '125mg'$  3 w-On; 1 W-off [start again on 2nd april]. Tolerating without any major side effects. CBC within normal limits except for hemoglobin 10.  Clinically no evidence of progression.   # Anemia/ IDA-CKD- Hb 10;  on PO iron and SL B-12. Dec 2017- not iron deficient.   # CKD Stage IV- creat 3.8/ stable. appt   # Follow-up in 4 weeks: labs (CBC, CMP)/MD; 8 weeks: labs/MD/port flush will order PET scan at visit.       Cammie Sickle, MD 10/24/2016 5:04 PM

## 2016-10-24 NOTE — Progress Notes (Signed)
Patient here today for follow up.  Patient states no new concerns today  

## 2016-11-21 ENCOUNTER — Inpatient Hospital Stay: Payer: Medicare Other

## 2016-11-21 ENCOUNTER — Inpatient Hospital Stay: Payer: Medicare Other | Attending: Internal Medicine

## 2016-11-21 ENCOUNTER — Inpatient Hospital Stay (HOSPITAL_BASED_OUTPATIENT_CLINIC_OR_DEPARTMENT_OTHER): Payer: Medicare Other | Admitting: Internal Medicine

## 2016-11-21 VITALS — BP 135/87 | HR 69 | Temp 97.8°F | Resp 18 | Ht 62.0 in | Wt 201.0 lb

## 2016-11-21 DIAGNOSIS — Z8041 Family history of malignant neoplasm of ovary: Secondary | ICD-10-CM | POA: Diagnosis not present

## 2016-11-21 DIAGNOSIS — Z7982 Long term (current) use of aspirin: Secondary | ICD-10-CM | POA: Diagnosis not present

## 2016-11-21 DIAGNOSIS — Z79811 Long term (current) use of aromatase inhibitors: Secondary | ICD-10-CM

## 2016-11-21 DIAGNOSIS — N184 Chronic kidney disease, stage 4 (severe): Secondary | ICD-10-CM

## 2016-11-21 DIAGNOSIS — E1122 Type 2 diabetes mellitus with diabetic chronic kidney disease: Secondary | ICD-10-CM

## 2016-11-21 DIAGNOSIS — Z87891 Personal history of nicotine dependence: Secondary | ICD-10-CM | POA: Diagnosis not present

## 2016-11-21 DIAGNOSIS — Z170 Estrogen receptor positive status [ER+]: Secondary | ICD-10-CM | POA: Diagnosis not present

## 2016-11-21 DIAGNOSIS — J45909 Unspecified asthma, uncomplicated: Secondary | ICD-10-CM

## 2016-11-21 DIAGNOSIS — E669 Obesity, unspecified: Secondary | ICD-10-CM | POA: Insufficient documentation

## 2016-11-21 DIAGNOSIS — Z8673 Personal history of transient ischemic attack (TIA), and cerebral infarction without residual deficits: Secondary | ICD-10-CM | POA: Diagnosis not present

## 2016-11-21 DIAGNOSIS — C50212 Malignant neoplasm of upper-inner quadrant of left female breast: Secondary | ICD-10-CM | POA: Diagnosis not present

## 2016-11-21 DIAGNOSIS — C7951 Secondary malignant neoplasm of bone: Secondary | ICD-10-CM

## 2016-11-21 DIAGNOSIS — Z79899 Other long term (current) drug therapy: Secondary | ICD-10-CM

## 2016-11-21 DIAGNOSIS — F329 Major depressive disorder, single episode, unspecified: Secondary | ICD-10-CM | POA: Diagnosis not present

## 2016-11-21 DIAGNOSIS — I129 Hypertensive chronic kidney disease with stage 1 through stage 4 chronic kidney disease, or unspecified chronic kidney disease: Secondary | ICD-10-CM | POA: Insufficient documentation

## 2016-11-21 DIAGNOSIS — I509 Heart failure, unspecified: Secondary | ICD-10-CM | POA: Diagnosis not present

## 2016-11-21 LAB — COMPREHENSIVE METABOLIC PANEL
ALBUMIN: 4.2 g/dL (ref 3.5–5.0)
ALK PHOS: 62 U/L (ref 38–126)
ALT: 17 U/L (ref 14–54)
ANION GAP: 7 (ref 5–15)
AST: 19 U/L (ref 15–41)
BILIRUBIN TOTAL: 0.6 mg/dL (ref 0.3–1.2)
BUN: 49 mg/dL — AB (ref 6–20)
CALCIUM: 9 mg/dL (ref 8.9–10.3)
CO2: 23 mmol/L (ref 22–32)
Chloride: 107 mmol/L (ref 101–111)
Creatinine, Ser: 3.91 mg/dL — ABNORMAL HIGH (ref 0.44–1.00)
GFR calc Af Amer: 13 mL/min — ABNORMAL LOW (ref >60)
GFR calc non Af Amer: 12 mL/min — ABNORMAL LOW (ref >60)
GLUCOSE: 237 mg/dL — AB (ref 65–99)
Potassium: 4.2 mmol/L (ref 3.5–5.1)
SODIUM: 137 mmol/L (ref 135–145)
TOTAL PROTEIN: 7.6 g/dL (ref 6.5–8.1)

## 2016-11-21 LAB — CBC WITH DIFFERENTIAL/PLATELET
BASOS ABS: 0.1 10*3/uL (ref 0–0.1)
BASOS PCT: 3 %
EOS ABS: 0 10*3/uL (ref 0–0.7)
Eosinophils Relative: 1 %
HEMATOCRIT: 28.5 % — AB (ref 35.0–47.0)
HEMOGLOBIN: 10.1 g/dL — AB (ref 12.0–16.0)
Lymphocytes Relative: 29 %
Lymphs Abs: 0.8 10*3/uL — ABNORMAL LOW (ref 1.0–3.6)
MCH: 37 pg — ABNORMAL HIGH (ref 26.0–34.0)
MCHC: 35.5 g/dL (ref 32.0–36.0)
MCV: 104.2 fL — ABNORMAL HIGH (ref 80.0–100.0)
Monocytes Absolute: 0.2 10*3/uL (ref 0.2–0.9)
Monocytes Relative: 8 %
NEUTROS ABS: 1.6 10*3/uL (ref 1.4–6.5)
NEUTROS PCT: 59 %
Platelets: 333 10*3/uL (ref 150–440)
RBC: 2.74 MIL/uL — AB (ref 3.80–5.20)
RDW: 13.3 % (ref 11.5–14.5)
WBC: 2.8 10*3/uL — ABNORMAL LOW (ref 3.6–11.0)

## 2016-11-21 MED ORDER — HEPARIN SOD (PORK) LOCK FLUSH 100 UNIT/ML IV SOLN
500.0000 [IU] | Freq: Once | INTRAVENOUS | Status: AC
  Administered 2016-11-21: 500 [IU] via INTRAVENOUS

## 2016-11-21 MED ORDER — SODIUM CHLORIDE 0.9% FLUSH
10.0000 mL | INTRAVENOUS | Status: DC | PRN
  Administered 2016-11-21: 10 mL via INTRAVENOUS
  Filled 2016-11-21: qty 10

## 2016-11-21 NOTE — Progress Notes (Signed)
[place] OFFICE PROGRESS NOTE  Patient Care Team: [name], [name]   SUMMARY OF ONCOLOGIC HISTORY:  Oncology History   # OCT 2015-STAGE IV LEFT BREAST T2N1 [T=4cm; [name]; [name]; [name]; EBUS- Positive Paratrac/subcarinal LN s/p ? Taxotere [in [place]; [name]; [name];[name]; [name]; suv 3]; [name]; [name]; [name];  [name]; Ant R iliac bone; inferior sternum- not on X-geva  # Poorly controlled Blood sugars- improved.   # Pancreatitis Hx/ CKD IV [creat ~ 2-3]     Carcinoma of upper-inner quadrant of left breast in female, estrogen receptor positive ([place])   05/23/2016 Initial Diagnosis    Carcinoma of upper-inner quadrant of left breast in female, estrogen receptor positive ([place])       INTERVAL HISTORY:  [name] with above history of metastatic left breast cancer currently on second line therapy with ibrance plus Femara is here for follow-up.   She denies any unusual shortness of breath or cough. Denies any swelling of the legs. Denies any significant weight gainOr weight loss..  No unusual bone pain.  No diarrhea. No swelling in legs.   REVIEW OF SYSTEMS:  A complete 10 point review of system is done which is negative except mentioned above/history of present illness.   PAST MEDICAL HISTORY :  Past Medical History:  Diagnosis Date  . Asthma   . CHF (congestive heart failure) ([place]) 1997  . CKD (chronic kidney disease)   . Depression   . Diabetes mellitus, type 2 ([place])   . Hair loss   .  History of left breast cancer 05/29/14  . Obesity   . Pancreatitis 1997  . Stroke [facility]) 2010   with mild left arm weakness    PAST SURGICAL HISTORY :   Past Surgical History:  Procedure Laterality Date  . CESAREAN SECTION    . CHOLECYSTECTOMY      FAMILY HISTORY :   Family History  Problem Relation Age of Onset  . Ovarian cancer Mother   . Diabetes Mother   . Hypertension Mother   . COPD Father   . Hypertension Father   . Diabetes Sister   . Diabetes Brother     SOCIAL HISTORY:   Social History  Substance Use Topics  . Smoking status: Former Smoker    Packs/day: 0.50    Years: 1.00    Types: Cigarettes  . Smokeless tobacco: Never Used  . Alcohol use No    ALLERGIES:  has No Known Allergies.  MEDICATIONS:  Current Outpatient Prescriptions  Medication Sig Dispense Refill  . albuterol (PROAIR HFA) 108 (90 BASE) MCG/ACT inhaler Inhale 1 puff into the lungs every 6 (six) hours as needed for shortness of breath.     Marland Kitchen albuterol (PROVENTIL) (2.5 MG/3ML) 0.083% nebulizer solution Inhale 2.5 mg into the lungs every 6 (six) hours as needed for shortness of breath.     . ALPRAZolam (XANAX) 0.5 MG tablet Take 0.5 mg by mouth at bedtime as needed for anxiety or sleep.     Marland Kitchen amLODipine (NORVASC) 10 MG tablet Take 10 mg by mouth  daily.     . aspirin EC 81 MG tablet Take 81 mg by mouth once.     Marland Kitchen atenolol (TENORMIN) 50 MG tablet TAKE 1 1/2 TABLETS BY MOUTH TWICE DAILY    . B-D ULTRA-FINE 33 LANCETS MISC Use 1 each 2 (two) times daily.    . B-D ULTRAFINE III SHORT PEN 31G X 8 MM MISC     . bumetanide (BUMEX) 0.5 MG tablet TAKE 1 TABLET BY MOUTH TWICE DAILY    . calcitRIOL (ROCALTROL) 0.25 MCG capsule Take 0.25 mcg by mouth 3 (three) times a week.    . Cinnamon 500 MG capsule Take 500 mg by mouth daily.     . cloNIDine (CATAPRES) 0.2 MG tablet Take 0.2 mg by mouth 2 (two) times daily.     . enalapril (VASOTEC) 20 MG tablet Take 20 mg by mouth 2 (two) times daily.     . ferrous  sulfate 325 (65 FE) MG tablet Take 325 mg by mouth 2 (two) times daily with a meal.    . FLUoxetine (PROZAC) 20 MG capsule Take 20 mg by mouth 2 (two) times daily.     Marland Kitchen glucose blood (ONE TOUCH ULTRA TEST) test strip Use 1 each 2 (two) times daily. Use as instructed.    . glyBURIDE (DIABETA) 5 MG tablet Take 5 mg by mouth daily with breakfast.     . IBRANCE 125 MG capsule TAKE 1 CAPSULE BY MOUTH ONE TIME DAILY WITH BREAKFAST FOR 3 WEEKS ON FOLLOWED BY 1 WEEK OFF. TAKE WHOLE WITH FOOD. 21 capsule 6  . letrozole (FEMARA) 2.5 MG tablet Take 1 tablet (2.5 mg total) by mouth daily. 90 tablet 3  . LEVEMIR FLEXTOUCH 100 UNIT/ML Pen Inject 50 Units into the skin daily.     Marland Kitchen NOVOLOG FLEXPEN 100 UNIT/ML FlexPen Inject 10 Units into the skin 2 (two) times daily at 8 am and 10 pm.     . salmeterol (SEREVENT) 50 MCG/DOSE diskus inhaler Inhale 1 puff into the lungs 2 (two) times daily.    . simvastatin (ZOCOR) 20 MG tablet Take 20 mg by mouth daily at 6 PM.     . vitamin B-12 (CYANOCOBALAMIN) 1000 MCG tablet Take 1,000 mcg by mouth daily.    Marland Kitchen acetaminophen-codeine (TYLENOL #4) 300-60 MG tablet Take 2 tablets by mouth every 6 (six) hours as needed for moderate pain.     . mometasone (NASONEX) 50 MCG/ACT nasal spray Place 2 sprays into the nose daily as needed.      No current facility-administered medications for this visit.    Facility-Administered Medications Ordered in Other Visits  Medication Dose Route Frequency Provider Last Rate Last Dose  . sodium chloride flush (NS) 0.9 % injection 10 mL  10 mL Intravenous PRN Cammie Sickle, MD   10 mL at 01/30/16 1054    PHYSICAL EXAMINATION: ECOG PERFORMANCE STATUS: 0 - Asymptomatic  BP 135/87 (BP Location: Right Arm, Patient Position: Sitting)   Pulse 69   Temp 97.8 F (36.6 C) (Tympanic)   Resp 18   Ht '5\' 2"'$  (1.575 m)   Wt 201 lb (91.2 kg)   BMI 36.76 kg/m   Filed Weights   11/21/16 1015  Weight: 201 lb (91.2 kg)    GENERAL:  Well-nourished well-developed; Alert, no distress and comfortable.  She is Accompanied with family.  EYES: no pallor or icterus OROPHARYNX: no thrush or ulceration; good dentition  NECK: supple, no masses felt LYMPH:  no palpable lymphadenopathy  in the cervical, axillary or inguinal regions LUNGS: clear to auscultation and  No wheeze or crackles HEART/CVS: regular rate & rhythm and no murmurs; No lower extremity edema ABDOMEN:abdomen soft, non-tender and normal bowel sounds Musculoskeletal:no cyanosis of digits and no clubbing  PSYCH: alert & oriented x 3 with fluent speech NEURO: no focal motor/sensory deficits SKIN:  no rashes or significant lesions    LABORATORY DATA:  I have reviewed the data as listed    Component Value Date/Time   NA 137 11/21/2016 0944   NA 130 (L) 06/06/2014 1102   K 4.2 11/21/2016 0944   K 3.9 06/06/2014 1102   CL 107 11/21/2016 0944   CL 95 (L) 06/06/2014 1102   CO2 23 11/21/2016 0944   CO2 28 06/06/2014 1102   GLUCOSE 237 (H) 11/21/2016 0944   GLUCOSE 349 (H) 06/06/2014 1102   BUN 49 (H) 11/21/2016 0944   BUN 17 06/06/2014 1102   CREATININE 3.91 (H) 11/21/2016 0944   CREATININE 1.63 (H) 06/06/2014 1102   CALCIUM 9.0 11/21/2016 0944   CALCIUM 9.2 06/06/2014 1102   PROT 7.6 11/21/2016 0944   PROT 8.2 06/06/2014 1102   ALBUMIN 4.2 11/21/2016 0944   ALBUMIN 3.3 (L) 06/06/2014 1102   AST 19 11/21/2016 0944   AST 7 (L) 06/06/2014 1102   ALT 17 11/21/2016 0944   ALT 12 (L) 06/06/2014 1102   ALKPHOS 62 11/21/2016 0944   ALKPHOS 74 06/06/2014 1102   BILITOT 0.6 11/21/2016 0944   BILITOT 0.4 06/06/2014 1102   GFRNONAA 12 (L) 11/21/2016 0944   GFRNONAA 35 (L) 06/06/2014 1102   GFRAA 13 (L) 11/21/2016 0944   GFRAA 42 (L) 06/06/2014 1102    No results found for: SPEP, UPEP  Lab Results  Component Value Date   WBC 2.8 (L) 11/21/2016   NEUTROABS 1.6 11/21/2016   HGB 10.1 (L) 11/21/2016   HCT 28.5 (L) 11/21/2016   MCV 104.2 (H) 11/21/2016    PLT 333 11/21/2016      Chemistry      Component Value Date/Time   NA 137 11/21/2016 0944   NA 130 (L) 06/06/2014 1102   K 4.2 11/21/2016 0944   K 3.9 06/06/2014 1102   CL 107 11/21/2016 0944   CL 95 (L) 06/06/2014 1102   CO2 23 11/21/2016 0944   CO2 28 06/06/2014 1102   BUN 49 (H) 11/21/2016 0944   BUN 17 06/06/2014 1102   CREATININE 3.91 (H) 11/21/2016 0944   CREATININE 1.63 (H) 06/06/2014 1102      Component Value Date/Time   CALCIUM 9.0 11/21/2016 0944   CALCIUM 9.2 06/06/2014 1102   ALKPHOS 62 11/21/2016 0944   ALKPHOS 74 06/06/2014 1102   AST 19 11/21/2016 0944   AST 7 (L) 06/06/2014 1102   ALT 17 11/21/2016 0944   ALT 12 (L) 06/06/2014 1102   BILITOT 0.6 11/21/2016 0944   BILITOT 0.4 06/06/2014 1102      Results for ANELISSE, JACOBSON A (MRN [id number]) as of 06/20/2016 10:54  Ref. Range 06/06/2014 11:02 03/28/2016 11:25 05/23/2016 10:50  CA 27.29 Latest Ref Range: 0.0 - 38.6 U/mL 58.0 (H) 40.7 (H) 50.4 (H)   ASSESSMENT & PLAN:  Carcinoma of upper-inner quadrant of left breast in female, estrogen receptor positive ([place]) # Left breast cancer stage IV/oligometastatic to the mediastinal lymph nodes currently  with ibrance and Femara since March 2016. DEC 12th 2017- PET- no progression/improved left breast mass/ stable-treated bone lesions.   # Clinically  NO  evidence of progression.  Patient will continue Femara with ibrance '125mg'$  3 w-On; 1 W-off [last pill tomorrow; start again on 2nd May 2018. Tolerating without any major side effects. CBC 2.8/ANC- 1.6; within normal limits except for hemoglobin 10.  Ordered PET today.   # Anemia/ IDA-CKD- Hb 10;  on PO iron and SL B-12. Dec 2017- not iron deficient.   # CKD Stage IV- creat 3.8/ stable. appt with Dr.Kolluru in few weeks.   # Follow-up in 4 weeks: labs (CBC, CMP)/MD;   CC; Dr.Kolluru.       Cammie Sickle, MD 11/23/2016 6:36 PM

## 2016-11-21 NOTE — Assessment & Plan Note (Addendum)
#   Left breast cancer stage IV/oligometastatic to the mediastinal lymph nodes currently  with ibrance and Femara since March 2016. DEC 12th 2017- PET- no progression/improved left breast mass/ stable-treated bone lesions.   # Clinically  NO evidence of progression.  Patient will continue Femara with ibrance '125mg'$  3 w-On; 1 W-off [last pill tomorrow; start again on 2nd May 2018. Tolerating without any major side effects. CBC 2.8/ANC- 1.6; within normal limits except for hemoglobin 10.  Ordered PET today.   # Anemia/ IDA-CKD- Hb 10;  on PO iron and SL B-12. Dec 2017- not iron deficient.   # CKD Stage IV- creat 3.8/ stable. appt with Dr.Kolluru in few weeks.   # Follow-up in 4 weeks: labs (CBC, CMP)/MD;   CC; Dr.Kolluru.

## 2016-11-21 NOTE — Progress Notes (Signed)
She will finish her last capsule of Ibrance tomorrow, then will start her day off on Sunday. She has already received her next shipment of Ibrance yesterday.  Patient has not missed any dosing.

## 2016-11-22 LAB — CANCER ANTIGEN 27.29: CA 27.29: 67.3 U/mL — ABNORMAL HIGH (ref 0.0–38.6)

## 2016-12-17 ENCOUNTER — Ambulatory Visit
Admission: RE | Admit: 2016-12-17 | Discharge: 2016-12-17 | Disposition: A | Discharge status: Home / Self Care | Payer: Medicare Other | Source: Ambulatory Visit | Attending: Internal Medicine | Admitting: Internal Medicine

## 2016-12-17 DIAGNOSIS — Z170 Estrogen receptor positive status [ER+]: Secondary | ICD-10-CM | POA: Diagnosis not present

## 2016-12-17 DIAGNOSIS — C50212 Malignant neoplasm of upper-inner quadrant of left female breast: Secondary | ICD-10-CM | POA: Insufficient documentation

## 2016-12-17 LAB — GLUCOSE, CAPILLARY: GLUCOSE-CAPILLARY: 256 mg/dL — AB (ref 65–99)

## 2016-12-18 ENCOUNTER — Ambulatory Visit
Admission: RE | Admit: 2016-12-18 | Discharge: 2016-12-18 | Disposition: A | Discharge status: Home / Self Care | Payer: Medicare Other | Source: Ambulatory Visit | Attending: Internal Medicine | Admitting: Internal Medicine

## 2016-12-18 ENCOUNTER — Telehealth: Payer: Self-pay | Admitting: *Deleted

## 2016-12-18 DIAGNOSIS — Z170 Estrogen receptor positive status [ER+]: Secondary | ICD-10-CM | POA: Insufficient documentation

## 2016-12-18 DIAGNOSIS — C50212 Malignant neoplasm of upper-inner quadrant of left female breast: Secondary | ICD-10-CM | POA: Diagnosis not present

## 2016-12-18 LAB — GLUCOSE, CAPILLARY
Glucose-Capillary: 210 mg/dL — ABNORMAL HIGH (ref 65–99)
Glucose-Capillary: 221 mg/dL — ABNORMAL HIGH (ref 65–99)
Glucose-Capillary: 222 mg/dL — ABNORMAL HIGH (ref 65–99)

## 2016-12-18 NOTE — Telephone Encounter (Signed)
-----   Message from Manus Rudd, RN sent at 12/18/2016 11:36 AM EDT ----- [facility] for [facility]  [phone]) called to report that they were unable to perform the scheduled scan today due to elevated blood glucose levels. Patient reported that the "chemo" pill she is taking elevates her blood sugar.  Imaging requests that scan be ordered during the chemo break. Patient was sent home and will need to notified of new appt. She was also scheduled to see Dr. Jacinto Reap on 5/18.  Call me if you have questiosn

## 2016-12-18 NOTE — Telephone Encounter (Addendum)
Patient came to clinic today at 1150 am requesting to speak to Dr. Aletha Halim nurse regarding her plan of care.   Discussed with patient obtaining an apt with pcp - asap with Dr. Ginette Pitman to manage blood sugars. Pt was not able to get her pet scan due to high glucose levels. Dr. Rogue Bussing needs glucose levels to be under control before pet scan is scheduled. Pt instructed to keep her apts tomorrow in the clinic as scheduled.  1600-Rn spoke [name] at Dr. Linton Ham office. Apt obtained for patient for Tuesday  945 am - arrive at 930.  1605- Pt made aware of the apt time and read back the time/date of the appointment.

## 2016-12-19 ENCOUNTER — Inpatient Hospital Stay: Payer: Medicare Other | Attending: Internal Medicine

## 2016-12-19 ENCOUNTER — Inpatient Hospital Stay (HOSPITAL_BASED_OUTPATIENT_CLINIC_OR_DEPARTMENT_OTHER): Payer: Medicare Other | Admitting: Internal Medicine

## 2016-12-19 VITALS — BP 135/83 | HR 69 | Temp 96.8°F | Resp 18 | Wt 197.8 lb

## 2016-12-19 DIAGNOSIS — Z79811 Long term (current) use of aromatase inhibitors: Secondary | ICD-10-CM

## 2016-12-19 DIAGNOSIS — N184 Chronic kidney disease, stage 4 (severe): Secondary | ICD-10-CM

## 2016-12-19 DIAGNOSIS — Z87891 Personal history of nicotine dependence: Secondary | ICD-10-CM

## 2016-12-19 DIAGNOSIS — E669 Obesity, unspecified: Secondary | ICD-10-CM | POA: Insufficient documentation

## 2016-12-19 DIAGNOSIS — Z170 Estrogen receptor positive status [ER+]: Secondary | ICD-10-CM | POA: Insufficient documentation

## 2016-12-19 DIAGNOSIS — D631 Anemia in chronic kidney disease: Secondary | ICD-10-CM | POA: Diagnosis not present

## 2016-12-19 DIAGNOSIS — C7951 Secondary malignant neoplasm of bone: Secondary | ICD-10-CM | POA: Diagnosis not present

## 2016-12-19 DIAGNOSIS — I129 Hypertensive chronic kidney disease with stage 1 through stage 4 chronic kidney disease, or unspecified chronic kidney disease: Secondary | ICD-10-CM

## 2016-12-19 DIAGNOSIS — I509 Heart failure, unspecified: Secondary | ICD-10-CM | POA: Diagnosis not present

## 2016-12-19 DIAGNOSIS — J45909 Unspecified asthma, uncomplicated: Secondary | ICD-10-CM

## 2016-12-19 DIAGNOSIS — Z8673 Personal history of transient ischemic attack (TIA), and cerebral infarction without residual deficits: Secondary | ICD-10-CM | POA: Diagnosis not present

## 2016-12-19 DIAGNOSIS — E1165 Type 2 diabetes mellitus with hyperglycemia: Secondary | ICD-10-CM | POA: Insufficient documentation

## 2016-12-19 DIAGNOSIS — Z8041 Family history of malignant neoplasm of ovary: Secondary | ICD-10-CM

## 2016-12-19 DIAGNOSIS — Z7982 Long term (current) use of aspirin: Secondary | ICD-10-CM

## 2016-12-19 DIAGNOSIS — Z79899 Other long term (current) drug therapy: Secondary | ICD-10-CM | POA: Diagnosis not present

## 2016-12-19 DIAGNOSIS — K859 Acute pancreatitis without necrosis or infection, unspecified: Secondary | ICD-10-CM

## 2016-12-19 DIAGNOSIS — C778 Secondary and unspecified malignant neoplasm of lymph nodes of multiple regions: Secondary | ICD-10-CM | POA: Insufficient documentation

## 2016-12-19 DIAGNOSIS — F329 Major depressive disorder, single episode, unspecified: Secondary | ICD-10-CM | POA: Insufficient documentation

## 2016-12-19 DIAGNOSIS — C50212 Malignant neoplasm of upper-inner quadrant of left female breast: Secondary | ICD-10-CM

## 2016-12-19 LAB — COMPREHENSIVE METABOLIC PANEL
ALBUMIN: 4.1 g/dL (ref 3.5–5.0)
ALK PHOS: 57 U/L (ref 38–126)
ALT: 12 U/L — ABNORMAL LOW (ref 14–54)
AST: 17 U/L (ref 15–41)
Anion gap: 7 (ref 5–15)
BILIRUBIN TOTAL: 0.8 mg/dL (ref 0.3–1.2)
BUN: 47 mg/dL — ABNORMAL HIGH (ref 6–20)
CO2: 22 mmol/L (ref 22–32)
Calcium: 9.3 mg/dL (ref 8.9–10.3)
Chloride: 104 mmol/L (ref 101–111)
Creatinine, Ser: 3.5 mg/dL — ABNORMAL HIGH (ref 0.44–1.00)
GFR calc Af Amer: 15 mL/min — ABNORMAL LOW (ref >60)
GFR, EST NON AFRICAN AMERICAN: 13 mL/min — AB (ref >60)
GLUCOSE: 142 mg/dL — AB (ref 65–99)
Potassium: 3.7 mmol/L (ref 3.5–5.1)
Sodium: 133 mmol/L — ABNORMAL LOW (ref 135–145)
TOTAL PROTEIN: 7.6 g/dL (ref 6.5–8.1)

## 2016-12-19 LAB — CBC WITH DIFFERENTIAL/PLATELET
BASOS ABS: 0 10*3/uL (ref 0–0.1)
BASOS PCT: 1 %
Eosinophils Absolute: 0.1 10*3/uL (ref 0–0.7)
Eosinophils Relative: 2 %
HEMATOCRIT: 28.2 % — AB (ref 35.0–47.0)
HEMOGLOBIN: 10 g/dL — AB (ref 12.0–16.0)
Lymphocytes Relative: 33 %
Lymphs Abs: 0.9 10*3/uL — ABNORMAL LOW (ref 1.0–3.6)
MCH: 36.5 pg — ABNORMAL HIGH (ref 26.0–34.0)
MCHC: 35.5 g/dL (ref 32.0–36.0)
MCV: 102.9 fL — AB (ref 80.0–100.0)
MONO ABS: 0.3 10*3/uL (ref 0.2–0.9)
Monocytes Relative: 9 %
NEUTROS ABS: 1.6 10*3/uL (ref 1.4–6.5)
NEUTROS PCT: 55 %
Platelets: 391 10*3/uL (ref 150–440)
RBC: 2.74 MIL/uL — AB (ref 3.80–5.20)
RDW: 13 % (ref 11.5–14.5)
WBC: 2.9 10*3/uL — AB (ref 3.6–11.0)

## 2016-12-19 MED ORDER — SODIUM CHLORIDE 0.9% FLUSH
10.0000 mL | Freq: Once | INTRAVENOUS | Status: AC
  Administered 2016-12-19: 10 mL via INTRAVENOUS
  Filled 2016-12-19: qty 10

## 2016-12-19 MED ORDER — HEPARIN SOD (PORK) LOCK FLUSH 100 UNIT/ML IV SOLN
500.0000 [IU] | Freq: Once | INTRAVENOUS | Status: AC
  Administered 2016-12-19: 500 [IU] via INTRAVENOUS

## 2016-12-19 NOTE — Progress Notes (Signed)
Patient has appointment with PCP, Dr. Ginette Pitman, on 12/23/16 for hyperglycemia control.

## 2016-12-19 NOTE — Progress Notes (Signed)
[place] OFFICE PROGRESS NOTE  Patient Care Team: [name], [name]   SUMMARY OF ONCOLOGIC HISTORY:  Oncology History   # OCT 2015-STAGE IV LEFT BREAST T2N1 [T=4cm; [name]; [name]; [name]; EBUS- Positive Paratrac/subcarinal LN s/p ? Taxotere [in [place]; [name]; [name];[name]; [name]; suv 3]; [name]; [name]; [name];  [name]; Ant R iliac bone; inferior sternum- not on X-geva  # Poorly controlled Blood sugars- improved.   # Pancreatitis Hx/ CKD IV [creat ~ 2-3]     Carcinoma of upper-inner quadrant of left breast in female, estrogen receptor positive ([place])   05/23/2016 Initial Diagnosis    Carcinoma of upper-inner quadrant of left breast in female, estrogen receptor positive ([place])       INTERVAL HISTORY:  [name] with above history of metastatic left breast cancer currently on second line therapy with ibrance plus Femara is here for follow-up. PET scan was not done prior to this visit because of elevated blood sugars.  Patient admits to elevated blood sugars because of dietary indiscretion/ and also "social stress". She is awaiting to visit her PCP next week- for better control of blood sugars prior to PET scan.   She denies any unusual shortness of breath or cough. Denies any swelling of the legs. Denies any significant weight gainOr weight loss..  No unusual bone pain.  No diarrhea. No swelling in legs.   REVIEW OF SYSTEMS:  A complete 10 point review of system is done which is negative except  mentioned above/history of present illness.   PAST MEDICAL HISTORY :  Past Medical History:  Diagnosis Date  . Asthma   . CHF (congestive heart failure) ([place]) 1997  . CKD (chronic kidney disease)   . Depression   . Diabetes mellitus, type 2 ([place])   . Hair loss   . History of left breast cancer 05/29/14  . Obesity   . Pancreatitis 1997  . Stroke [facility]) 2010   with mild left arm weakness    PAST SURGICAL HISTORY :   Past Surgical History:  Procedure Laterality Date  . CESAREAN SECTION    . CHOLECYSTECTOMY      FAMILY HISTORY :   Family History  Problem Relation Age of Onset  . Ovarian cancer Mother   . Diabetes Mother   . Hypertension Mother   . COPD Father   . Hypertension Father   . Diabetes Sister   . Diabetes Brother     SOCIAL HISTORY:   Social History  Substance Use Topics  . Smoking status: Former Smoker    Packs/day: 0.50    Years: 1.00    Types: Cigarettes  . Smokeless tobacco: Never Used  . Alcohol use No    ALLERGIES:  has No Known Allergies.  MEDICATIONS:  Current Outpatient Prescriptions  Medication Sig Dispense Refill  . acetaminophen-codeine (TYLENOL #4) 300-60 MG tablet Take 2 tablets by mouth every 6 (six) hours as needed for moderate pain.     Marland Kitchen albuterol (PROAIR HFA) 108 (90 BASE) MCG/ACT inhaler Inhale 1 puff into the lungs every 6 (six)  hours as needed for shortness of breath.     Marland Kitchen albuterol (PROVENTIL) (2.5 MG/3ML) 0.083% nebulizer solution Inhale 2.5 mg into the lungs every 6 (six) hours as needed for shortness of breath.     . ALPRAZolam (XANAX) 0.5 MG tablet Take 0.5 mg by mouth at bedtime as needed for anxiety or sleep.     Marland Kitchen amLODipine (NORVASC) 10 MG tablet Take 10 mg by mouth daily.     Marland Kitchen aspirin EC 81 MG tablet Take 81 mg by mouth once.     Marland Kitchen atenolol (TENORMIN) 50 MG tablet TAKE 1 1/2 TABLETS BY MOUTH TWICE DAILY    . B-D ULTRA-FINE 33 LANCETS MISC Use 1 each 2 (two) times daily.    . B-D ULTRAFINE III SHORT PEN 31G X 8 MM MISC      . bumetanide (BUMEX) 0.5 MG tablet TAKE 1 TABLET BY MOUTH TWICE DAILY    . calcitRIOL (ROCALTROL) 0.25 MCG capsule Take 0.25 mcg by mouth 3 (three) times a week.    . Cinnamon 500 MG capsule Take 500 mg by mouth daily.     . cloNIDine (CATAPRES) 0.2 MG tablet Take 0.2 mg by mouth 2 (two) times daily.     . enalapril (VASOTEC) 20 MG tablet Take 20 mg by mouth 2 (two) times daily.     . ferrous sulfate 325 (65 FE) MG tablet Take 325 mg by mouth 2 (two) times daily with a meal.    . FLUoxetine (PROZAC) 20 MG capsule Take 20 mg by mouth 2 (two) times daily.     Marland Kitchen glucose blood (ONE TOUCH ULTRA TEST) test strip Use 1 each 2 (two) times daily. Use as instructed.    . glyBURIDE (DIABETA) 5 MG tablet Take 5 mg by mouth daily with breakfast.     . IBRANCE 125 MG capsule TAKE 1 CAPSULE BY MOUTH ONE TIME DAILY WITH BREAKFAST FOR 3 WEEKS ON FOLLOWED BY 1 WEEK OFF. TAKE WHOLE WITH FOOD. 21 capsule 6  . letrozole (FEMARA) 2.5 MG tablet Take 1 tablet (2.5 mg total) by mouth daily. 90 tablet 3  . LEVEMIR FLEXTOUCH 100 UNIT/ML Pen Inject 50 Units into the skin daily.     . mometasone (NASONEX) 50 MCG/ACT nasal spray Place 2 sprays into the nose daily as needed.     Marland Kitchen NOVOLOG FLEXPEN 100 UNIT/ML FlexPen Inject 10 Units into the skin 2 (two) times daily at 8 am and 10 pm.     . salmeterol (SEREVENT) 50 MCG/DOSE diskus inhaler Inhale 1 puff into the lungs 2 (two) times daily.    . simvastatin (ZOCOR) 20 MG tablet Take 20 mg by mouth daily at 6 PM.     . vitamin B-12 (CYANOCOBALAMIN) 1000 MCG tablet Take 1,000 mcg by mouth daily.     No current facility-administered medications for this visit.    Facility-Administered Medications Ordered in Other Visits  Medication Dose Route Frequency Provider Last Rate Last Dose  . sodium chloride flush (NS) 0.9 % injection 10 mL  10 mL Intravenous PRN Cammie Sickle, MD   10 mL at 01/30/16 1054    PHYSICAL EXAMINATION: ECOG PERFORMANCE STATUS: 0 -  Asymptomatic  BP 135/83   Pulse 69   Temp (!) 96.8 F (36 C) (Tympanic)   Resp 18   Wt 197 lb 12.8 oz (89.7 kg)   BMI 36.18 kg/m   Filed Weights   12/19/16 1330  Weight: 197 lb 12.8 oz (89.7 kg)  GENERAL: Well-nourished well-developed; Alert, no distress and comfortable.  She is Accompanied with family.  EYES: no pallor or icterus OROPHARYNX: no thrush or ulceration; good dentition  NECK: supple, no masses felt LYMPH:  no palpable lymphadenopathy in the cervical, axillary or inguinal regions LUNGS: clear to auscultation and  No wheeze or crackles HEART/CVS: regular rate & rhythm and no murmurs; No lower extremity edema ABDOMEN:abdomen soft, non-tender and normal bowel sounds Musculoskeletal:no cyanosis of digits and no clubbing  PSYCH: alert & oriented x 3 with fluent speech NEURO: no focal motor/sensory deficits SKIN:  no rashes or significant lesions    LABORATORY DATA:  I have reviewed the data as listed    Component Value Date/Time   NA 133 (L) 12/19/2016 1305   NA 130 (L) 06/06/2014 1102   K 3.7 12/19/2016 1305   K 3.9 06/06/2014 1102   CL 104 12/19/2016 1305   CL 95 (L) 06/06/2014 1102   CO2 22 12/19/2016 1305   CO2 28 06/06/2014 1102   GLUCOSE 142 (H) 12/19/2016 1305   GLUCOSE 349 (H) 06/06/2014 1102   BUN 47 (H) 12/19/2016 1305   BUN 17 06/06/2014 1102   CREATININE 3.50 (H) 12/19/2016 1305   CREATININE 1.63 (H) 06/06/2014 1102   CALCIUM 9.3 12/19/2016 1305   CALCIUM 9.2 06/06/2014 1102   PROT 7.6 12/19/2016 1305   PROT 8.2 06/06/2014 1102   ALBUMIN 4.1 12/19/2016 1305   ALBUMIN 3.3 (L) 06/06/2014 1102   AST 17 12/19/2016 1305   AST 7 (L) 06/06/2014 1102   ALT 12 (L) 12/19/2016 1305   ALT 12 (L) 06/06/2014 1102   ALKPHOS 57 12/19/2016 1305   ALKPHOS 74 06/06/2014 1102   BILITOT 0.8 12/19/2016 1305   BILITOT 0.4 06/06/2014 1102   GFRNONAA 13 (L) 12/19/2016 1305   GFRNONAA 35 (L) 06/06/2014 1102   GFRAA 15 (L) 12/19/2016 1305   GFRAA 42 (L)  06/06/2014 1102    No results found for: SPEP, UPEP  Lab Results  Component Value Date   WBC 2.9 (L) 12/19/2016   NEUTROABS 1.6 12/19/2016   HGB 10.0 (L) 12/19/2016   HCT 28.2 (L) 12/19/2016   MCV 102.9 (H) 12/19/2016   PLT 391 12/19/2016      Chemistry      Component Value Date/Time   NA 133 (L) 12/19/2016 1305   NA 130 (L) 06/06/2014 1102   K 3.7 12/19/2016 1305   K 3.9 06/06/2014 1102   CL 104 12/19/2016 1305   CL 95 (L) 06/06/2014 1102   CO2 22 12/19/2016 1305   CO2 28 06/06/2014 1102   BUN 47 (H) 12/19/2016 1305   BUN 17 06/06/2014 1102   CREATININE 3.50 (H) 12/19/2016 1305   CREATININE 1.63 (H) 06/06/2014 1102      Component Value Date/Time   CALCIUM 9.3 12/19/2016 1305   CALCIUM 9.2 06/06/2014 1102   ALKPHOS 57 12/19/2016 1305   ALKPHOS 74 06/06/2014 1102   AST 17 12/19/2016 1305   AST 7 (L) 06/06/2014 1102   ALT 12 (L) 12/19/2016 1305   ALT 12 (L) 06/06/2014 1102   BILITOT 0.8 12/19/2016 1305   BILITOT 0.4 06/06/2014 1102     Results for Hengst, Toshua A (MRN [id number]) as of 12/21/2016 17:50  Ref. Range 06/20/2016 09:10 08/29/2016 09:57 09/26/2016 09:35 11/21/2016 09:44 12/19/2016 13:05  CA 27.29 Latest Ref Range: 0.0 - 38.6 U/mL 51.0 (H) 56.8 (H) 52.8 (H) 67.3 (H) 70.7 (H)     ASSESSMENT & PLAN:  Carcinoma of upper-inner quadrant of left breast in female, estrogen receptor positive ([place]) # Left breast cancer stage IV/oligometastatic to the mediastinal lymph nodes currently  with ibrance and Femara since March 2016. DEC 12th 2017- PET- no progression/improved left breast mass/ stable-treated bone lesions. Await PET scan at this time  # Clinically  NO evidence of progression.  Patient will continue Femara with ibrance '125mg'$  3 w-On; 1 W-off [last pill tomorrow; start again on 2nd May 2018. Tolerating without any major side effects. CBC 2.9/ANC- 1.6; within normal limits except for hemoglobin 10.    # Elevated Blood sugars- sec to ? Stress- unable to get PET  scan. Await receipt PCP next week  # Anemia/ IDA-CKD- Hb 10;  on PO iron and SL B-12. Dec 2017- not iron deficient. Hold of Aranesp given the active malignancy.  # CKD Stage IV- creat 3.5/ stable. appt with Dr.Kolluru in few weeks.   # Follow-up on July 2nd  labs (CBC, CMP) /MD; PET scan in week of 29th; will call with results.   CC; Dr.Kolluru. Dr.Hande.       Cammie Sickle, MD 12/21/2016 5:51 PM

## 2016-12-19 NOTE — Assessment & Plan Note (Addendum)
#   Left breast cancer stage IV/oligometastatic to the mediastinal lymph nodes currently  with ibrance and Femara since March 2016. DEC 12th 2017- PET- no progression/improved left breast mass/ stable-treated bone lesions. Await PET scan at this time  # Clinically  NO evidence of progression.  Patient will continue Femara with ibrance '125mg'$  3 w-On; 1 W-off [last pill tomorrow; start again on 2nd May 2018. Tolerating without any major side effects. CBC 2.9/ANC- 1.6; within normal limits except for hemoglobin 10.    # Elevated Blood sugars- sec to ? Stress- unable to get PET scan. Await receipt PCP next week  # Anemia/ IDA-CKD- Hb 10;  on PO iron and SL B-12. Dec 2017- not iron deficient. Hold of Aranesp given the active malignancy.  # CKD Stage IV- creat 3.5/ stable. appt with Dr.Kolluru in few weeks.   # Follow-up on July 2nd  labs (CBC, CMP) /MD; PET scan in week of 29th; will call with results.   CC; Dr.Kolluru. Dr.Hande.

## 2016-12-20 LAB — CANCER ANTIGEN 27.29: CA 27.29: 70.7 U/mL — ABNORMAL HIGH (ref 0.0–38.6)

## 2016-12-23 DIAGNOSIS — E1122 Type 2 diabetes mellitus with diabetic chronic kidney disease: Secondary | ICD-10-CM | POA: Diagnosis not present

## 2016-12-23 DIAGNOSIS — I10 Essential (primary) hypertension: Secondary | ICD-10-CM | POA: Diagnosis not present

## 2016-12-23 DIAGNOSIS — I5022 Chronic systolic (congestive) heart failure: Secondary | ICD-10-CM | POA: Diagnosis not present

## 2016-12-23 DIAGNOSIS — N184 Chronic kidney disease, stage 4 (severe): Secondary | ICD-10-CM | POA: Diagnosis not present

## 2016-12-23 DIAGNOSIS — C50412 Malignant neoplasm of upper-outer quadrant of left female breast: Secondary | ICD-10-CM | POA: Diagnosis not present

## 2016-12-23 DIAGNOSIS — F3289 Other specified depressive episodes: Secondary | ICD-10-CM | POA: Diagnosis not present

## 2016-12-23 DIAGNOSIS — Z794 Long term (current) use of insulin: Secondary | ICD-10-CM | POA: Diagnosis not present

## 2016-12-23 DIAGNOSIS — E782 Mixed hyperlipidemia: Secondary | ICD-10-CM | POA: Diagnosis not present

## 2016-12-23 DIAGNOSIS — J4520 Mild intermittent asthma, uncomplicated: Secondary | ICD-10-CM | POA: Diagnosis not present

## 2016-12-31 ENCOUNTER — Ambulatory Visit
Admission: RE | Admit: 2016-12-31 | Discharge: 2016-12-31 | Disposition: A | Discharge status: Home / Self Care | Payer: Medicare Other | Source: Ambulatory Visit | Attending: Internal Medicine | Admitting: Internal Medicine

## 2016-12-31 DIAGNOSIS — I517 Cardiomegaly: Secondary | ICD-10-CM | POA: Diagnosis not present

## 2016-12-31 DIAGNOSIS — Z90711 Acquired absence of uterus with remaining cervical stump: Secondary | ICD-10-CM

## 2016-12-31 DIAGNOSIS — C50212 Malignant neoplasm of upper-inner quadrant of left female breast: Secondary | ICD-10-CM | POA: Insufficient documentation

## 2016-12-31 DIAGNOSIS — Z79899 Other long term (current) drug therapy: Secondary | ICD-10-CM | POA: Diagnosis not present

## 2016-12-31 DIAGNOSIS — Z170 Estrogen receptor positive status [ER+]: Secondary | ICD-10-CM | POA: Insufficient documentation

## 2016-12-31 DIAGNOSIS — C50912 Malignant neoplasm of unspecified site of left female breast: Secondary | ICD-10-CM | POA: Diagnosis not present

## 2016-12-31 DIAGNOSIS — Z7982 Long term (current) use of aspirin: Secondary | ICD-10-CM | POA: Insufficient documentation

## 2016-12-31 HISTORY — DX: Acquired absence of uterus with remaining cervical stump: Z90.711

## 2016-12-31 HISTORY — PX: PARTIAL HYSTERECTOMY: SHX80

## 2016-12-31 LAB — GLUCOSE, CAPILLARY: GLUCOSE-CAPILLARY: 134 mg/dL — AB (ref 65–99)

## 2016-12-31 MED ORDER — FLUDEOXYGLUCOSE F - 18 (FDG) INJECTION
12.0000 | Freq: Once | INTRAVENOUS | Status: AC | PRN
  Administered 2016-12-31: 12.84 via INTRAVENOUS

## 2017-01-02 ENCOUNTER — Telehealth: Payer: Self-pay | Admitting: Internal Medicine

## 2017-01-02 NOTE — Telephone Encounter (Signed)
Left message for pt to state the scan -fairly stable; continue ibrance +femara/current therapy.

## 2017-01-02 NOTE — Telephone Encounter (Signed)
Patient return phone call for Dr. Rogue Bussing 1500. RN return phone call at 1611. Pt informed that pet scan was stable and to continue with current ibrance and femara. She thanked me for returning her phone call.

## 2017-01-07 DIAGNOSIS — I120 Hypertensive chronic kidney disease with stage 5 chronic kidney disease or end stage renal disease: Secondary | ICD-10-CM | POA: Diagnosis not present

## 2017-01-07 DIAGNOSIS — N2581 Secondary hyperparathyroidism of renal origin: Secondary | ICD-10-CM | POA: Diagnosis not present

## 2017-01-07 DIAGNOSIS — R809 Proteinuria, unspecified: Secondary | ICD-10-CM | POA: Diagnosis not present

## 2017-01-07 DIAGNOSIS — E1122 Type 2 diabetes mellitus with diabetic chronic kidney disease: Secondary | ICD-10-CM | POA: Diagnosis not present

## 2017-01-07 DIAGNOSIS — D631 Anemia in chronic kidney disease: Secondary | ICD-10-CM | POA: Diagnosis not present

## 2017-01-07 DIAGNOSIS — N185 Chronic kidney disease, stage 5: Secondary | ICD-10-CM | POA: Diagnosis not present

## 2017-01-23 DIAGNOSIS — I5022 Chronic systolic (congestive) heart failure: Secondary | ICD-10-CM | POA: Diagnosis not present

## 2017-01-23 DIAGNOSIS — E782 Mixed hyperlipidemia: Secondary | ICD-10-CM | POA: Diagnosis not present

## 2017-01-23 DIAGNOSIS — M25512 Pain in left shoulder: Secondary | ICD-10-CM | POA: Diagnosis not present

## 2017-01-23 DIAGNOSIS — N184 Chronic kidney disease, stage 4 (severe): Secondary | ICD-10-CM | POA: Diagnosis not present

## 2017-01-23 DIAGNOSIS — E1122 Type 2 diabetes mellitus with diabetic chronic kidney disease: Secondary | ICD-10-CM | POA: Diagnosis not present

## 2017-01-23 DIAGNOSIS — I340 Nonrheumatic mitral (valve) insufficiency: Secondary | ICD-10-CM | POA: Diagnosis not present

## 2017-01-23 DIAGNOSIS — I10 Essential (primary) hypertension: Secondary | ICD-10-CM | POA: Diagnosis not present

## 2017-01-26 DIAGNOSIS — I5022 Chronic systolic (congestive) heart failure: Secondary | ICD-10-CM | POA: Diagnosis not present

## 2017-01-26 DIAGNOSIS — N184 Chronic kidney disease, stage 4 (severe): Secondary | ICD-10-CM | POA: Diagnosis not present

## 2017-01-26 DIAGNOSIS — Z794 Long term (current) use of insulin: Secondary | ICD-10-CM | POA: Diagnosis not present

## 2017-01-26 DIAGNOSIS — E1122 Type 2 diabetes mellitus with diabetic chronic kidney disease: Secondary | ICD-10-CM | POA: Diagnosis not present

## 2017-01-26 DIAGNOSIS — I63521 Cerebral infarction due to unspecified occlusion or stenosis of right anterior cerebral artery: Secondary | ICD-10-CM | POA: Diagnosis not present

## 2017-01-26 DIAGNOSIS — I10 Essential (primary) hypertension: Secondary | ICD-10-CM | POA: Diagnosis not present

## 2017-01-26 DIAGNOSIS — I340 Nonrheumatic mitral (valve) insufficiency: Secondary | ICD-10-CM | POA: Diagnosis not present

## 2017-02-02 ENCOUNTER — Inpatient Hospital Stay: Payer: Medicare Other | Attending: Internal Medicine

## 2017-02-02 ENCOUNTER — Inpatient Hospital Stay (HOSPITAL_BASED_OUTPATIENT_CLINIC_OR_DEPARTMENT_OTHER): Payer: Medicare Other | Admitting: Internal Medicine

## 2017-02-02 VITALS — BP 128/76 | HR 71 | Temp 97.2°F | Resp 16 | Wt 196.0 lb

## 2017-02-02 DIAGNOSIS — E669 Obesity, unspecified: Secondary | ICD-10-CM

## 2017-02-02 DIAGNOSIS — I509 Heart failure, unspecified: Secondary | ICD-10-CM | POA: Diagnosis not present

## 2017-02-02 DIAGNOSIS — F329 Major depressive disorder, single episode, unspecified: Secondary | ICD-10-CM | POA: Diagnosis not present

## 2017-02-02 DIAGNOSIS — Z7982 Long term (current) use of aspirin: Secondary | ICD-10-CM | POA: Diagnosis not present

## 2017-02-02 DIAGNOSIS — K859 Acute pancreatitis without necrosis or infection, unspecified: Secondary | ICD-10-CM | POA: Diagnosis not present

## 2017-02-02 DIAGNOSIS — J45909 Unspecified asthma, uncomplicated: Secondary | ICD-10-CM | POA: Diagnosis not present

## 2017-02-02 DIAGNOSIS — I129 Hypertensive chronic kidney disease with stage 1 through stage 4 chronic kidney disease, or unspecified chronic kidney disease: Secondary | ICD-10-CM

## 2017-02-02 DIAGNOSIS — Z79899 Other long term (current) drug therapy: Secondary | ICD-10-CM

## 2017-02-02 DIAGNOSIS — Z87891 Personal history of nicotine dependence: Secondary | ICD-10-CM | POA: Diagnosis not present

## 2017-02-02 DIAGNOSIS — Z8673 Personal history of transient ischemic attack (TIA), and cerebral infarction without residual deficits: Secondary | ICD-10-CM | POA: Diagnosis not present

## 2017-02-02 DIAGNOSIS — E119 Type 2 diabetes mellitus without complications: Secondary | ICD-10-CM

## 2017-02-02 DIAGNOSIS — C50212 Malignant neoplasm of upper-inner quadrant of left female breast: Secondary | ICD-10-CM | POA: Diagnosis not present

## 2017-02-02 DIAGNOSIS — C7951 Secondary malignant neoplasm of bone: Secondary | ICD-10-CM | POA: Diagnosis not present

## 2017-02-02 DIAGNOSIS — Z8041 Family history of malignant neoplasm of ovary: Secondary | ICD-10-CM

## 2017-02-02 DIAGNOSIS — Z170 Estrogen receptor positive status [ER+]: Secondary | ICD-10-CM | POA: Insufficient documentation

## 2017-02-02 DIAGNOSIS — N184 Chronic kidney disease, stage 4 (severe): Secondary | ICD-10-CM | POA: Diagnosis not present

## 2017-02-02 DIAGNOSIS — Z79811 Long term (current) use of aromatase inhibitors: Secondary | ICD-10-CM

## 2017-02-02 DIAGNOSIS — D631 Anemia in chronic kidney disease: Secondary | ICD-10-CM | POA: Diagnosis not present

## 2017-02-02 DIAGNOSIS — E1122 Type 2 diabetes mellitus with diabetic chronic kidney disease: Secondary | ICD-10-CM | POA: Diagnosis not present

## 2017-02-02 DIAGNOSIS — L739 Follicular disorder, unspecified: Secondary | ICD-10-CM | POA: Diagnosis not present

## 2017-02-02 DIAGNOSIS — Z95828 Presence of other vascular implants and grafts: Secondary | ICD-10-CM

## 2017-02-02 LAB — CBC WITH DIFFERENTIAL/PLATELET
Basophils Absolute: 0.1 10*3/uL (ref 0–0.1)
Basophils Relative: 3 %
Eosinophils Absolute: 0 10*3/uL (ref 0–0.7)
Eosinophils Relative: 1 %
HEMATOCRIT: 28.4 % — AB (ref 35.0–47.0)
Hemoglobin: 10.2 g/dL — ABNORMAL LOW (ref 12.0–16.0)
LYMPHS PCT: 28 %
Lymphs Abs: 1.1 10*3/uL (ref 1.0–3.6)
MCH: 36.4 pg — ABNORMAL HIGH (ref 26.0–34.0)
MCHC: 35.8 g/dL (ref 32.0–36.0)
MCV: 101.5 fL — AB (ref 80.0–100.0)
MONO ABS: 0.5 10*3/uL (ref 0.2–0.9)
MONOS PCT: 12 %
NEUTROS ABS: 2.2 10*3/uL (ref 1.4–6.5)
Neutrophils Relative %: 56 %
Platelets: 156 10*3/uL (ref 150–440)
RBC: 2.8 MIL/uL — ABNORMAL LOW (ref 3.80–5.20)
RDW: 12.9 % (ref 11.5–14.5)
WBC: 3.8 10*3/uL (ref 3.6–11.0)

## 2017-02-02 LAB — COMPREHENSIVE METABOLIC PANEL
ALT: 13 U/L — ABNORMAL LOW (ref 14–54)
ANION GAP: 8 (ref 5–15)
AST: 17 U/L (ref 15–41)
Albumin: 4.1 g/dL (ref 3.5–5.0)
Alkaline Phosphatase: 65 U/L (ref 38–126)
BILIRUBIN TOTAL: 0.6 mg/dL (ref 0.3–1.2)
BUN: 49 mg/dL — ABNORMAL HIGH (ref 6–20)
CO2: 24 mmol/L (ref 22–32)
Calcium: 9.3 mg/dL (ref 8.9–10.3)
Chloride: 102 mmol/L (ref 101–111)
Creatinine, Ser: 3.54 mg/dL — ABNORMAL HIGH (ref 0.44–1.00)
GFR calc non Af Amer: 13 mL/min — ABNORMAL LOW (ref >60)
GFR, EST AFRICAN AMERICAN: 15 mL/min — AB (ref >60)
Glucose, Bld: 165 mg/dL — ABNORMAL HIGH (ref 65–99)
POTASSIUM: 4.1 mmol/L (ref 3.5–5.1)
Sodium: 134 mmol/L — ABNORMAL LOW (ref 135–145)
TOTAL PROTEIN: 7.6 g/dL (ref 6.5–8.1)

## 2017-02-02 MED ORDER — HEPARIN SOD (PORK) LOCK FLUSH 100 UNIT/ML IV SOLN
500.0000 [IU] | Freq: Once | INTRAVENOUS | Status: AC
  Administered 2017-02-02: 500 [IU] via INTRAVENOUS

## 2017-02-02 MED ORDER — CEPHALEXIN 500 MG PO CAPS: 500.0000 mg | ORAL_CAPSULE | Freq: Three times a day (TID) | ORAL | 0 refills | Status: DC

## 2017-02-02 MED ORDER — SODIUM CHLORIDE 0.9% FLUSH
10.0000 mL | INTRAVENOUS | Status: DC | PRN
  Administered 2017-02-02: 10 mL via INTRAVENOUS
  Filled 2017-02-02: qty 10

## 2017-02-02 NOTE — Progress Notes (Signed)
Patient here today for follow up.   

## 2017-02-02 NOTE — Assessment & Plan Note (Addendum)
#   Left breast cancer stage IV/oligometastatic to the mediastinal lymph nodes currently  with ibrance and Femara since March 2016. May 30th  PET- no progression/improved left breast mass/ stable-treated bone lesions; except for Right acetabular increased uptake [asymptomatic; monitor for now]  # Clinically  NO evidence of progression.  Patient will continue Femara with ibrance 125mg  3 w-On; 1 W-off; start ibrance next week [see discussion below].  # right submaibular-folliculitis reocmmend kelfex 500 TID x 7. Discussed that if it started in the better- this evening to be drained. She'll call her PCP if this Gets worse.  # Anemia/ IDA-CKD- Hb 10;  on PO iron and SL B-12. Dec 2017- not iron deficient. Hold of Aranesp given the active malignancy.  # CKD Stage IV- creat 3.5/ stable. appt with Dr.Kolluru in September 2018.    # follow up in 5 weeks/labs.   # I reviewed the blood work- with the patient in detail; also reviewed the imaging independently [as summarized above]; and with the patient in detail.

## 2017-02-02 NOTE — Progress Notes (Signed)
[place] OFFICE PROGRESS NOTE  Patient Care Team: [name], [name]   SUMMARY OF ONCOLOGIC HISTORY:  Oncology History   # OCT 2015-STAGE IV LEFT BREAST T2N1 [T=4cm; [name]; [name]; [name]; EBUS- Positive Paratrac/subcarinal LN s/p ? Taxotere [in [place]; [name]; [name];[name]; [name]; suv 3]; [name]; [name]; [name];  [name]; Ant R iliac bone; inferior sternum- not on X-geva  # Poorly controlled Blood sugars- improved.   # Pancreatitis Hx/ CKD IV [creat ~ 2-3]     Carcinoma of upper-inner quadrant of left breast in female, estrogen receptor positive ([place])   05/23/2016 Initial Diagnosis    Carcinoma of upper-inner quadrant of left breast in female, estrogen receptor positive ([place])       INTERVAL HISTORY:  [name] with above history of metastatic left breast cancer currently on second line therapy with ibrance plus Femara is here for follow-up/To review the results of the PET scan.  Patient noted to have "small knot under her right neck". No bleeding noted. Mildly tender.  She denies any unusual shortness of breath or cough. Denies any swelling of the legs. Denies any significant weight gainOr weight loss..  No unusual bone pain.  No diarrhea. No swelling in legs.   REVIEW OF SYSTEMS:  A complete 10 point review of system is done which is negative except mentioned above/history of present illness.   PAST MEDICAL HISTORY :  Past Medical History:  Diagnosis Date  . Asthma   . CHF (congestive  heart failure) ([place]) 1997  . CKD (chronic kidney disease)   . Depression   . Diabetes mellitus, type 2 ([place])   . Hair loss   . History of left breast cancer 05/29/14  . History of partial hysterectomy 12/31/2016   Per patient.  Has not had a period in years.  Had a partial hysterectomy years ago.  . Obesity   . Pancreatitis 1997  . Stroke [facility]) 2010   with mild left arm weakness    PAST SURGICAL HISTORY :   Past Surgical History:  Procedure Laterality Date  . CESAREAN SECTION    . CHOLECYSTECTOMY    . PARTIAL HYSTERECTOMY  12/31/2016   Per patient, she has not had a period in years since she had a partial hysterectomy.    FAMILY HISTORY :   Family History  Problem Relation Age of Onset  . Ovarian cancer Mother   . Diabetes Mother   . Hypertension Mother   . COPD Father   . Hypertension Father   . Diabetes Sister   . Diabetes Brother     SOCIAL HISTORY:   Social History  Substance Use Topics  . Smoking status: Former Smoker    Packs/day: 0.50    Years: 1.00    Types: Cigarettes  . Smokeless tobacco: Never Used  . Alcohol use No    ALLERGIES:  has No Known Allergies.  MEDICATIONS:  Current Outpatient Prescriptions  Medication Sig Dispense Refill  . acetaminophen-codeine (TYLENOL #4) 300-60 MG tablet Take 2 tablets by mouth every 6 (six) hours as needed for  moderate pain.     Marland Kitchen albuterol (PROAIR HFA) 108 (90 BASE) MCG/ACT inhaler Inhale 1 puff into the lungs every 6 (six) hours as needed for shortness of breath.     Marland Kitchen albuterol (PROVENTIL) (2.5 MG/3ML) 0.083% nebulizer solution Inhale 2.5 mg into the lungs every 6 (six) hours as needed for shortness of breath.     . ALPRAZolam (XANAX) 0.5 MG tablet Take 0.5 mg by mouth at bedtime as needed for anxiety or sleep.     Marland Kitchen amLODipine (NORVASC) 10 MG tablet Take 10 mg by mouth daily.     Marland Kitchen aspirin EC 81 MG tablet Take 81 mg by mouth once.     Marland Kitchen atenolol (TENORMIN) 50 MG tablet TAKE 1 1/2 TABLETS BY MOUTH TWICE DAILY     . B-D ULTRA-FINE 33 LANCETS MISC Use 1 each 2 (two) times daily.    . B-D ULTRAFINE III SHORT PEN 31G X 8 MM MISC     . bumetanide (BUMEX) 0.5 MG tablet TAKE 1 TABLET BY MOUTH TWICE DAILY    . calcitRIOL (ROCALTROL) 0.25 MCG capsule Take 0.25 mcg by mouth 3 (three) times a week.    . Cinnamon 500 MG capsule Take 500 mg by mouth daily.     . cloNIDine (CATAPRES) 0.2 MG tablet Take 0.2 mg by mouth 2 (two) times daily.     . enalapril (VASOTEC) 20 MG tablet Take 20 mg by mouth 2 (two) times daily.     . ferrous sulfate 325 (65 FE) MG tablet Take 325 mg by mouth 2 (two) times daily with a meal.    . FLUoxetine (PROZAC) 20 MG capsule Take 20 mg by mouth 2 (two) times daily.     Marland Kitchen glucose blood (ONE TOUCH ULTRA TEST) test strip Use 1 each 2 (two) times daily. Use as instructed.    . glyBURIDE (DIABETA) 5 MG tablet Take 5 mg by mouth daily with breakfast.     . IBRANCE 125 MG capsule TAKE 1 CAPSULE BY MOUTH ONE TIME DAILY WITH BREAKFAST FOR 3 WEEKS ON FOLLOWED BY 1 WEEK OFF. TAKE WHOLE WITH FOOD. 21 capsule 6  . letrozole (FEMARA) 2.5 MG tablet Take 1 tablet (2.5 mg total) by mouth daily. 90 tablet 3  . LEVEMIR FLEXTOUCH 100 UNIT/ML Pen Inject 55 Units into the skin daily.     . mometasone (NASONEX) 50 MCG/ACT nasal spray Place 2 sprays into the nose daily as needed.     Marland Kitchen NOVOLOG FLEXPEN 100 UNIT/ML FlexPen Inject 7 Units into the skin 2 (two) times daily at 8 am and 10 pm.     . salmeterol (SEREVENT) 50 MCG/DOSE diskus inhaler Inhale 1 puff into the lungs 2 (two) times daily.    . simvastatin (ZOCOR) 20 MG tablet Take 20 mg by mouth daily at 6 PM.     . vitamin B-12 (CYANOCOBALAMIN) 1000 MCG tablet Take 1,000 mcg by mouth daily.    . cephALEXin (KEFLEX) 500 MG capsule Take 1 capsule (500 mg total) by mouth 3 (three) times daily. 21 capsule 0   No current facility-administered medications for this visit.    Facility-Administered Medications Ordered in Other Visits  Medication Dose Route Frequency  Provider Last Rate Last Dose  . sodium chloride flush (NS) 0.9 % injection 10 mL  10 mL Intravenous PRN Cammie Sickle, MD   10 mL at 01/30/16 1054    PHYSICAL EXAMINATION: ECOG PERFORMANCE STATUS: 0 - Asymptomatic  BP 128/76 (BP Location: Right  Arm, Patient Position: Sitting)   Pulse 71   Temp 97.2 F (36.2 C) (Tympanic)   Resp 16   Wt 196 lb (88.9 kg)   BMI 35.85 kg/m   Filed Weights   02/02/17 1020  Weight: 196 lb (88.9 kg)    GENERAL: Well-nourished well-developed; Alert, no distress and comfortable.  She is Accompanied with family.  EYES: no pallor or icterus OROPHARYNX: no thrush or ulceration; good dentition  NECK: supple, no masses felt LYMPH:  no palpable lymphadenopathy in the cervical, axillary or inguinal regions LUNGS: clear to auscultation and  No wheeze or crackles HEART/CVS: regular rate & rhythm and no murmurs; No lower extremity edema ABDOMEN:abdomen soft, non-tender and normal bowel sounds Musculoskeletal:no cyanosis of digits and no clubbing  PSYCH: alert & oriented x 3 with fluent speech NEURO: no focal motor/sensory deficits SKIN:  Approximately 2 x 2 centimeter raised lesion noted under the right neck. Tender fluctuant.    LABORATORY DATA:  I have reviewed the data as listed    Component Value Date/Time   NA 134 (L) 02/02/2017 0915   NA 130 (L) 06/06/2014 1102   K 4.1 02/02/2017 0915   K 3.9 06/06/2014 1102   CL 102 02/02/2017 0915   CL 95 (L) 06/06/2014 1102   CO2 24 02/02/2017 0915   CO2 28 06/06/2014 1102   GLUCOSE 165 (H) 02/02/2017 0915   GLUCOSE 349 (H) 06/06/2014 1102   BUN 49 (H) 02/02/2017 0915   BUN 17 06/06/2014 1102   CREATININE 3.54 (H) 02/02/2017 0915   CREATININE 1.63 (H) 06/06/2014 1102   CALCIUM 9.3 02/02/2017 0915   CALCIUM 9.2 06/06/2014 1102   PROT 7.6 02/02/2017 0915   PROT 8.2 06/06/2014 1102   ALBUMIN 4.1 02/02/2017 0915   ALBUMIN 3.3 (L) 06/06/2014 1102   AST 17 02/02/2017 0915   AST 7 (L) 06/06/2014  1102   ALT 13 (L) 02/02/2017 0915   ALT 12 (L) 06/06/2014 1102   ALKPHOS 65 02/02/2017 0915   ALKPHOS 74 06/06/2014 1102   BILITOT 0.6 02/02/2017 0915   BILITOT 0.4 06/06/2014 1102   GFRNONAA 13 (L) 02/02/2017 0915   GFRNONAA 35 (L) 06/06/2014 1102   GFRAA 15 (L) 02/02/2017 0915   GFRAA 42 (L) 06/06/2014 1102    No results found for: SPEP, UPEP  Lab Results  Component Value Date   WBC 3.8 02/02/2017   NEUTROABS 2.2 02/02/2017   HGB 10.2 (L) 02/02/2017   HCT 28.4 (L) 02/02/2017   MCV 101.5 (H) 02/02/2017   PLT 156 02/02/2017      Chemistry      Component Value Date/Time   NA 134 (L) 02/02/2017 0915   NA 130 (L) 06/06/2014 1102   K 4.1 02/02/2017 0915   K 3.9 06/06/2014 1102   CL 102 02/02/2017 0915   CL 95 (L) 06/06/2014 1102   CO2 24 02/02/2017 0915   CO2 28 06/06/2014 1102   BUN 49 (H) 02/02/2017 0915   BUN 17 06/06/2014 1102   CREATININE 3.54 (H) 02/02/2017 0915   CREATININE 1.63 (H) 06/06/2014 1102      Component Value Date/Time   CALCIUM 9.3 02/02/2017 0915   CALCIUM 9.2 06/06/2014 1102   ALKPHOS 65 02/02/2017 0915   ALKPHOS 74 06/06/2014 1102   AST 17 02/02/2017 0915   AST 7 (L) 06/06/2014 1102   ALT 13 (L) 02/02/2017 0915   ALT 12 (L) 06/06/2014 1102   BILITOT 0.6 02/02/2017 0915   BILITOT 0.4 06/06/2014  1102     Results for [name], [name] (MRN [id number]) as of 12/21/2016 17:50  Ref. Range 06/20/2016 09:10 08/29/2016 09:57 09/26/2016 09:35 11/21/2016 09:44 12/19/2016 13:05  CA 27.29 Latest Ref Range: 0.0 - 38.6 U/mL 51.0 (H) 56.8 (H) 52.8 (H) 67.3 (H) 70.7 (H)     ASSESSMENT & PLAN:  Carcinoma of upper-inner quadrant of left breast in female, estrogen receptor positive ([place]) # Left breast cancer stage IV/oligometastatic to the mediastinal lymph nodes currently  with ibrance and Femara since March 2016. May 30th  PET- no progression/improved left breast mass/ stable-treated bone lesions; except for Right acetabular increased uptake [asymptomatic; monitor  for now]  # Clinically  NO evidence of progression.  Patient will continue Femara with ibrance 125mg  3 w-On; 1 W-off; start ibrance next week [see discussion below].  # right submaibular-folliculitis reocmmend kelfex 500 TID x 7. Discussed that if it started in the better- this evening to be drained. She'll call her PCP if this Gets worse.  # Anemia/ IDA-CKD- Hb 10;  on PO iron and SL B-12. Dec 2017- not iron deficient. Hold of Aranesp given the active malignancy.  # CKD Stage IV- creat 3.5/ stable. appt with Dr.Kolluru in September 2018.    # follow up in 5 weeks/labs.   # I reviewed the blood work- with the patient in detail; also reviewed the imaging independently [as summarized above]; and with the patient in detail.        Cammie Sickle, MD 02/02/2017 5:07 PM

## 2017-02-03 LAB — CANCER ANTIGEN 27.29: CA 27.29: 74.9 U/mL — AB (ref 0.0–38.6)

## 2017-03-09 ENCOUNTER — Inpatient Hospital Stay (HOSPITAL_BASED_OUTPATIENT_CLINIC_OR_DEPARTMENT_OTHER): Payer: Medicare Other | Admitting: Internal Medicine

## 2017-03-09 ENCOUNTER — Inpatient Hospital Stay: Payer: Medicare Other | Attending: Internal Medicine

## 2017-03-09 VITALS — BP 131/86 | HR 70 | Temp 96.6°F | Resp 16 | Wt 193.8 lb

## 2017-03-09 DIAGNOSIS — N184 Chronic kidney disease, stage 4 (severe): Secondary | ICD-10-CM

## 2017-03-09 DIAGNOSIS — Z79811 Long term (current) use of aromatase inhibitors: Secondary | ICD-10-CM | POA: Diagnosis not present

## 2017-03-09 DIAGNOSIS — Z9221 Personal history of antineoplastic chemotherapy: Secondary | ICD-10-CM | POA: Insufficient documentation

## 2017-03-09 DIAGNOSIS — Z8673 Personal history of transient ischemic attack (TIA), and cerebral infarction without residual deficits: Secondary | ICD-10-CM

## 2017-03-09 DIAGNOSIS — E119 Type 2 diabetes mellitus without complications: Secondary | ICD-10-CM | POA: Diagnosis not present

## 2017-03-09 DIAGNOSIS — Z87891 Personal history of nicotine dependence: Secondary | ICD-10-CM

## 2017-03-09 DIAGNOSIS — C7951 Secondary malignant neoplasm of bone: Secondary | ICD-10-CM | POA: Diagnosis not present

## 2017-03-09 DIAGNOSIS — Z794 Long term (current) use of insulin: Secondary | ICD-10-CM | POA: Diagnosis not present

## 2017-03-09 DIAGNOSIS — Z7982 Long term (current) use of aspirin: Secondary | ICD-10-CM | POA: Diagnosis not present

## 2017-03-09 DIAGNOSIS — Z170 Estrogen receptor positive status [ER+]: Secondary | ICD-10-CM

## 2017-03-09 DIAGNOSIS — C50212 Malignant neoplasm of upper-inner quadrant of left female breast: Secondary | ICD-10-CM

## 2017-03-09 DIAGNOSIS — J45909 Unspecified asthma, uncomplicated: Secondary | ICD-10-CM

## 2017-03-09 DIAGNOSIS — I509 Heart failure, unspecified: Secondary | ICD-10-CM

## 2017-03-09 DIAGNOSIS — I129 Hypertensive chronic kidney disease with stage 1 through stage 4 chronic kidney disease, or unspecified chronic kidney disease: Secondary | ICD-10-CM

## 2017-03-09 DIAGNOSIS — F329 Major depressive disorder, single episode, unspecified: Secondary | ICD-10-CM

## 2017-03-09 DIAGNOSIS — D509 Iron deficiency anemia, unspecified: Secondary | ICD-10-CM | POA: Insufficient documentation

## 2017-03-09 DIAGNOSIS — Z8719 Personal history of other diseases of the digestive system: Secondary | ICD-10-CM | POA: Insufficient documentation

## 2017-03-09 DIAGNOSIS — E669 Obesity, unspecified: Secondary | ICD-10-CM

## 2017-03-09 DIAGNOSIS — Z79899 Other long term (current) drug therapy: Secondary | ICD-10-CM | POA: Insufficient documentation

## 2017-03-09 LAB — COMPREHENSIVE METABOLIC PANEL
ALT: 12 U/L — ABNORMAL LOW (ref 14–54)
ANION GAP: 7 (ref 5–15)
AST: 15 U/L (ref 15–41)
Albumin: 4.3 g/dL (ref 3.5–5.0)
Alkaline Phosphatase: 58 U/L (ref 38–126)
BUN: 46 mg/dL — AB (ref 6–20)
CHLORIDE: 104 mmol/L (ref 101–111)
CO2: 25 mmol/L (ref 22–32)
Calcium: 9.1 mg/dL (ref 8.9–10.3)
Creatinine, Ser: 3.86 mg/dL — ABNORMAL HIGH (ref 0.44–1.00)
GFR calc Af Amer: 14 mL/min — ABNORMAL LOW (ref >60)
GFR calc non Af Amer: 12 mL/min — ABNORMAL LOW (ref >60)
GLUCOSE: 125 mg/dL — AB (ref 65–99)
POTASSIUM: 5 mmol/L (ref 3.5–5.1)
SODIUM: 136 mmol/L (ref 135–145)
Total Bilirubin: 0.6 mg/dL (ref 0.3–1.2)
Total Protein: 7.9 g/dL (ref 6.5–8.1)

## 2017-03-09 LAB — CBC WITH DIFFERENTIAL/PLATELET
BASOS ABS: 0.1 10*3/uL (ref 0–0.1)
BASOS PCT: 2 %
Eosinophils Absolute: 0 10*3/uL (ref 0–0.7)
Eosinophils Relative: 1 %
HEMATOCRIT: 28.4 % — AB (ref 35.0–47.0)
Hemoglobin: 10 g/dL — ABNORMAL LOW (ref 12.0–16.0)
LYMPHS PCT: 33 %
Lymphs Abs: 1 10*3/uL (ref 1.0–3.6)
MCH: 35.9 pg — ABNORMAL HIGH (ref 26.0–34.0)
MCHC: 35.3 g/dL (ref 32.0–36.0)
MCV: 101.9 fL — ABNORMAL HIGH (ref 80.0–100.0)
MONO ABS: 0.4 10*3/uL (ref 0.2–0.9)
Monocytes Relative: 12 %
NEUTROS ABS: 1.5 10*3/uL (ref 1.4–6.5)
Neutrophils Relative %: 52 %
PLATELETS: 160 10*3/uL (ref 150–440)
RBC: 2.79 MIL/uL — ABNORMAL LOW (ref 3.80–5.20)
RDW: 13.8 % (ref 11.5–14.5)
WBC: 2.9 10*3/uL — ABNORMAL LOW (ref 3.6–11.0)

## 2017-03-09 NOTE — Progress Notes (Signed)
[facility] OFFICE PROGRESS NOTE  Patient Care Team: [name], [name]   SUMMARY OF ONCOLOGIC HISTORY:  Oncology History   # OCT 2015-STAGE IV LEFT BREAST T2N1 [T=4cm; [name]; [name]; [name]; EBUS- Positive Paratrac/subcarinal LN s/p ? Taxotere [in [place]; [name]; [name];[name]; [name]; suv 3]; [name]; [name]; [name];  [name]; Ant R iliac bone; inferior sternum- not on X-geva  # Poorly controlled Blood sugars- improved.   # Pancreatitis Hx/ CKD IV [creat ~ 2-3]     Carcinoma of upper-inner quadrant of left breast in female, estrogen receptor positive ([place])   05/23/2016 Initial Diagnosis    Carcinoma of upper-inner quadrant of left breast in female, estrogen receptor positive ([place])       INTERVAL HISTORY:  [name] with above history of metastatic left breast cancer currently on second line therapy with ibrance plus Femara is here for follow-up.   Patient states her right neck "knot"-that has resolved since starting on antibiotics. No fevers or chills. She denies any unusual shortness of breath or cough. Denies any swelling of the legs. Denies any significant weight gain Or weight loss. No unusual bone pain.  No diarrhea. No swelling in legs.   REVIEW OF SYSTEMS:  A complete 10 point review of system is done which is negative except mentioned above/history of present illness.   PAST MEDICAL HISTORY :  Past Medical History:  Diagnosis Date  . Asthma   . CHF (congestive heart failure) ([place]) 1997   . CKD (chronic kidney disease)   . Depression   . Diabetes mellitus, type 2 ([place])   . Hair loss   . History of left breast cancer 05/29/14  . History of partial hysterectomy 12/31/2016   Per patient.  Has not had a period in years.  Had a partial hysterectomy years ago.  . Obesity   . Pancreatitis 1997  . Stroke [facility]) 2010   with mild left arm weakness    PAST SURGICAL HISTORY :   Past Surgical History:  Procedure Laterality Date  . CESAREAN SECTION    . CHOLECYSTECTOMY    . PARTIAL HYSTERECTOMY  12/31/2016   Per patient, she has not had a period in years since she had a partial hysterectomy.    FAMILY HISTORY :   Family History  Problem Relation Age of Onset  . Ovarian cancer Mother   . Diabetes Mother   . Hypertension Mother   . COPD Father   . Hypertension Father   . Diabetes Sister   . Diabetes Brother     SOCIAL HISTORY:   Social History  Substance Use Topics  . Smoking status: Former Smoker    Packs/day: 0.50    Years: 1.00    Types: Cigarettes  . Smokeless tobacco: Never Used  . Alcohol use No    ALLERGIES:  has No Known Allergies.  MEDICATIONS:  Current Outpatient Prescriptions  Medication Sig Dispense Refill  . acetaminophen-codeine (TYLENOL #4) 300-60 MG tablet Take 2 tablets by mouth every 6 (six) hours as needed for moderate pain.     Marland Kitchen  albuterol (PROAIR HFA) 108 (90 BASE) MCG/ACT inhaler Inhale 1 puff into the lungs every 6 (six) hours as needed for shortness of breath.     Marland Kitchen albuterol (PROVENTIL) (2.5 MG/3ML) 0.083% nebulizer solution Inhale 2.5 mg into the lungs every 6 (six) hours as needed for shortness of breath.     . ALPRAZolam (XANAX) 0.5 MG tablet Take 0.5 mg by mouth at bedtime as needed for anxiety or sleep.     Marland Kitchen amLODipine (NORVASC) 10 MG tablet Take 10 mg by mouth daily.     Marland Kitchen aspirin EC 81 MG tablet Take 81 mg by mouth once.     Marland Kitchen atenolol (TENORMIN) 50 MG tablet TAKE 1 1/2 TABLETS BY MOUTH TWICE DAILY    . B-D ULTRA-FINE 33  LANCETS MISC Use 1 each 2 (two) times daily.    . B-D ULTRAFINE III SHORT PEN 31G X 8 MM MISC     . bumetanide (BUMEX) 0.5 MG tablet TAKE 1 TABLET BY MOUTH TWICE DAILY    . calcitRIOL (ROCALTROL) 0.25 MCG capsule Take 0.25 mcg by mouth 3 (three) times a week.    . cephALEXin (KEFLEX) 500 MG capsule Take 1 capsule (500 mg total) by mouth 3 (three) times daily. 21 capsule 0  . Cinnamon 500 MG capsule Take 500 mg by mouth daily.     . cloNIDine (CATAPRES) 0.2 MG tablet Take 0.2 mg by mouth 2 (two) times daily.     . enalapril (VASOTEC) 20 MG tablet Take 20 mg by mouth 2 (two) times daily.     . ferrous sulfate 325 (65 FE) MG tablet Take 325 mg by mouth 2 (two) times daily with a meal.    . FLUoxetine (PROZAC) 20 MG capsule Take 20 mg by mouth 2 (two) times daily.     Marland Kitchen glucose blood (ONE TOUCH ULTRA TEST) test strip Use 1 each 2 (two) times daily. Use as instructed.    . glyBURIDE (DIABETA) 5 MG tablet Take 5 mg by mouth daily with breakfast.     . letrozole (FEMARA) 2.5 MG tablet Take 1 tablet (2.5 mg total) by mouth daily. 90 tablet 3  . LEVEMIR FLEXTOUCH 100 UNIT/ML Pen Inject 55 Units into the skin daily.     . mometasone (NASONEX) 50 MCG/ACT nasal spray Place 2 sprays into the nose daily as needed.     Marland Kitchen NOVOLOG FLEXPEN 100 UNIT/ML FlexPen Inject 7 Units into the skin 2 (two) times daily at 8 am and 10 pm.     . salmeterol (SEREVENT) 50 MCG/DOSE diskus inhaler Inhale 1 puff into the lungs 2 (two) times daily.    . simvastatin (ZOCOR) 20 MG tablet Take 20 mg by mouth daily at 6 PM.     . vitamin B-12 (CYANOCOBALAMIN) 1000 MCG tablet Take 1,000 mcg by mouth daily.    Leslee Home 125 MG capsule TAKE 1 CAPSULE BY MOUTH ONE TIME DAILY WITH BREAKFAST FOR 3 WEEKS ON FOLLOWED BY 1 WEEK OFF. TAKE WHOLE WITH FOOD. (Patient not taking: Reported on 03/09/2017) 21 capsule 6   No current facility-administered medications for this visit.    Facility-Administered Medications Ordered in Other Visits  Medication  Dose Route Frequency Provider Last Rate Last Dose  . sodium chloride flush (NS) 0.9 % injection 10 mL  10 mL Intravenous PRN Cammie Sickle, MD   10 mL at 01/30/16 1054    PHYSICAL EXAMINATION: ECOG PERFORMANCE STATUS: 0 - Asymptomatic  BP 131/86 (BP Location: Left Arm,  Patient Position: Sitting)   Pulse 70   Temp (!) 96.6 F (35.9 C) (Tympanic)   Resp 16   Wt 193 lb 12.8 oz (87.9 kg)   BMI 35.45 kg/m   Filed Weights   03/09/17 1122  Weight: 193 lb 12.8 oz (87.9 kg)    GENERAL: Well-nourished well-developed; Alert, no distress and comfortable.  She is Accompanied with family.  EYES: no pallor or icterus OROPHARYNX: no thrush or ulceration; good dentition  NECK: supple, no masses felt LYMPH:  no palpable lymphadenopathy in the cervical, axillary or inguinal regions LUNGS: clear to auscultation and  No wheeze or crackles HEART/CVS: regular rate & rhythm and no murmurs; No lower extremity edema ABDOMEN:abdomen soft, non-tender and normal bowel sounds Musculoskeletal:no cyanosis of digits and no clubbing  PSYCH: alert & oriented x 3 with fluent speech NEURO: no focal motor/sensory deficits SKIN:  Resolved "folliculitis"    LABORATORY DATA:  I have reviewed the data as listed    Component Value Date/Time   NA 136 03/09/2017 1055   NA 130 (L) 06/06/2014 1102   K 5.0 03/09/2017 1055   K 3.9 06/06/2014 1102   CL 104 03/09/2017 1055   CL 95 (L) 06/06/2014 1102   CO2 25 03/09/2017 1055   CO2 28 06/06/2014 1102   GLUCOSE 125 (H) 03/09/2017 1055   GLUCOSE 349 (H) 06/06/2014 1102   BUN 46 (H) 03/09/2017 1055   BUN 17 06/06/2014 1102   CREATININE 3.86 (H) 03/09/2017 1055   CREATININE 1.63 (H) 06/06/2014 1102   CALCIUM 9.1 03/09/2017 1055   CALCIUM 9.2 06/06/2014 1102   PROT 7.9 03/09/2017 1055   PROT 8.2 06/06/2014 1102   ALBUMIN 4.3 03/09/2017 1055   ALBUMIN 3.3 (L) 06/06/2014 1102   AST 15 03/09/2017 1055   AST 7 (L) 06/06/2014 1102   ALT 12 (L) 03/09/2017  1055   ALT 12 (L) 06/06/2014 1102   ALKPHOS 58 03/09/2017 1055   ALKPHOS 74 06/06/2014 1102   BILITOT 0.6 03/09/2017 1055   BILITOT 0.4 06/06/2014 1102   GFRNONAA 12 (L) 03/09/2017 1055   GFRNONAA 35 (L) 06/06/2014 1102   GFRAA 14 (L) 03/09/2017 1055   GFRAA 42 (L) 06/06/2014 1102    No results found for: SPEP, UPEP  Lab Results  Component Value Date   WBC 2.9 (L) 03/09/2017   NEUTROABS 1.5 03/09/2017   HGB 10.0 (L) 03/09/2017   HCT 28.4 (L) 03/09/2017   MCV 101.9 (H) 03/09/2017   PLT 160 03/09/2017      Chemistry      Component Value Date/Time   NA 136 03/09/2017 1055   NA 130 (L) 06/06/2014 1102   K 5.0 03/09/2017 1055   K 3.9 06/06/2014 1102   CL 104 03/09/2017 1055   CL 95 (L) 06/06/2014 1102   CO2 25 03/09/2017 1055   CO2 28 06/06/2014 1102   BUN 46 (H) 03/09/2017 1055   BUN 17 06/06/2014 1102   CREATININE 3.86 (H) 03/09/2017 1055   CREATININE 1.63 (H) 06/06/2014 1102      Component Value Date/Time   CALCIUM 9.1 03/09/2017 1055   CALCIUM 9.2 06/06/2014 1102   ALKPHOS 58 03/09/2017 1055   ALKPHOS 74 06/06/2014 1102   AST 15 03/09/2017 1055   AST 7 (L) 06/06/2014 1102   ALT 12 (L) 03/09/2017 1055   ALT 12 (L) 06/06/2014 1102   BILITOT 0.6 03/09/2017 1055   BILITOT 0.4 06/06/2014 1102     Results for Moroney, Ysabela  A (MRN [id number]) as of 12/21/2016 17:50  Ref. Range 06/20/2016 09:10 08/29/2016 09:57 09/26/2016 09:35 11/21/2016 09:44 12/19/2016 13:05  CA 27.29 Latest Ref Range: 0.0 - 38.6 U/mL 51.0 (H) 56.8 (H) 52.8 (H) 67.3 (H) 70.7 (H)     ASSESSMENT & PLAN:  Carcinoma of upper-inner quadrant of left breast in female, estrogen receptor positive ([place]) # Left breast cancer stage IV/oligometastatic to the mediastinal lymph nodes currently  with ibrance and Femara since March 2016. May 30th  PET- no progression/improved left breast mass/ stable-treated bone lesions; except for Right acetabular increased uptake [asymptomatic; monitor for now]  # Clinically  NO  evidence of progression.  Patient will continue Femara with ibrance 125mg  3 w-On; 1 W-off [again start 8/07]  # right submaibular-folliculitis s/p kelfex; resolved.   # Anemia/ IDA-CKD- Hb 10-STABLE.  # CKD Stage IV- creat 3.5/ stable; No dialysis yet. appt with Dr.Kolluru in September 2018.    # follow up in 4 weeks/ labs.       Cammie Sickle, MD 03/09/2017 11:48 AM

## 2017-03-09 NOTE — Assessment & Plan Note (Addendum)
#   Left breast cancer stage IV/oligometastatic to the mediastinal lymph nodes currently  with ibrance and Femara since March 2016. May 30th  PET- no progression/improved left breast mass/ stable-treated bone lesions; except for Right acetabular increased uptake [asymptomatic; monitor for now]  # Clinically  NO evidence of progression.  Patient will continue Femara with ibrance 125mg  3 w-On; 1 W-off [again start 8/07]  # right submaibular-folliculitis s/p kelfex; resolved.   # Anemia/ IDA-CKD- Hb 10-STABLE.  # CKD Stage IV- creat 3.5/ stable; No dialysis yet. appt with Dr.Kolluru in September 2018.    # follow up in 4 weeks/ labs.

## 2017-03-09 NOTE — Progress Notes (Signed)
Patient is here today for a follow up. Patient states no new concerns today.  

## 2017-03-10 LAB — CANCER ANTIGEN 27.29: CAN 27.29: 89.6 U/mL — AB (ref 0.0–38.6)

## 2017-03-20 ENCOUNTER — Other Ambulatory Visit: Payer: Self-pay | Admitting: Internal Medicine

## 2017-03-20 DIAGNOSIS — C50912 Malignant neoplasm of unspecified site of left female breast: Secondary | ICD-10-CM

## 2017-04-04 DIAGNOSIS — Z23 Encounter for immunization: Secondary | ICD-10-CM | POA: Diagnosis not present

## 2017-04-09 ENCOUNTER — Encounter: Payer: Self-pay | Admitting: Internal Medicine

## 2017-04-09 ENCOUNTER — Other Ambulatory Visit: Payer: Medicare Other

## 2017-04-09 ENCOUNTER — Ambulatory Visit: Payer: Medicare Other | Admitting: Internal Medicine

## 2017-04-09 ENCOUNTER — Inpatient Hospital Stay: Payer: Medicare Other | Attending: Hematology and Oncology | Admitting: Internal Medicine

## 2017-04-09 ENCOUNTER — Inpatient Hospital Stay: Payer: Medicare Other

## 2017-04-09 VITALS — BP 116/75 | HR 68 | Temp 97.8°F | Resp 20 | Ht 62.0 in | Wt 193.5 lb

## 2017-04-09 DIAGNOSIS — Z79811 Long term (current) use of aromatase inhibitors: Secondary | ICD-10-CM | POA: Diagnosis not present

## 2017-04-09 DIAGNOSIS — Z8041 Family history of malignant neoplasm of ovary: Secondary | ICD-10-CM | POA: Insufficient documentation

## 2017-04-09 DIAGNOSIS — I509 Heart failure, unspecified: Secondary | ICD-10-CM | POA: Diagnosis not present

## 2017-04-09 DIAGNOSIS — E669 Obesity, unspecified: Secondary | ICD-10-CM | POA: Diagnosis not present

## 2017-04-09 DIAGNOSIS — Z9049 Acquired absence of other specified parts of digestive tract: Secondary | ICD-10-CM | POA: Diagnosis not present

## 2017-04-09 DIAGNOSIS — Z8619 Personal history of other infectious and parasitic diseases: Secondary | ICD-10-CM | POA: Diagnosis not present

## 2017-04-09 DIAGNOSIS — Z8719 Personal history of other diseases of the digestive system: Secondary | ICD-10-CM | POA: Insufficient documentation

## 2017-04-09 DIAGNOSIS — Z90710 Acquired absence of both cervix and uterus: Secondary | ICD-10-CM | POA: Insufficient documentation

## 2017-04-09 DIAGNOSIS — Z87891 Personal history of nicotine dependence: Secondary | ICD-10-CM | POA: Insufficient documentation

## 2017-04-09 DIAGNOSIS — Z170 Estrogen receptor positive status [ER+]: Secondary | ICD-10-CM | POA: Diagnosis not present

## 2017-04-09 DIAGNOSIS — C50212 Malignant neoplasm of upper-inner quadrant of left female breast: Secondary | ICD-10-CM | POA: Diagnosis not present

## 2017-04-09 DIAGNOSIS — Z9221 Personal history of antineoplastic chemotherapy: Secondary | ICD-10-CM | POA: Insufficient documentation

## 2017-04-09 DIAGNOSIS — Z79899 Other long term (current) drug therapy: Secondary | ICD-10-CM | POA: Diagnosis not present

## 2017-04-09 DIAGNOSIS — F329 Major depressive disorder, single episode, unspecified: Secondary | ICD-10-CM | POA: Insufficient documentation

## 2017-04-09 DIAGNOSIS — Z794 Long term (current) use of insulin: Secondary | ICD-10-CM | POA: Diagnosis not present

## 2017-04-09 DIAGNOSIS — E1165 Type 2 diabetes mellitus with hyperglycemia: Secondary | ICD-10-CM

## 2017-04-09 DIAGNOSIS — J45909 Unspecified asthma, uncomplicated: Secondary | ICD-10-CM | POA: Diagnosis not present

## 2017-04-09 DIAGNOSIS — I129 Hypertensive chronic kidney disease with stage 1 through stage 4 chronic kidney disease, or unspecified chronic kidney disease: Secondary | ICD-10-CM | POA: Insufficient documentation

## 2017-04-09 DIAGNOSIS — C7951 Secondary malignant neoplasm of bone: Secondary | ICD-10-CM | POA: Diagnosis not present

## 2017-04-09 DIAGNOSIS — Z7952 Long term (current) use of systemic steroids: Secondary | ICD-10-CM | POA: Insufficient documentation

## 2017-04-09 DIAGNOSIS — N184 Chronic kidney disease, stage 4 (severe): Secondary | ICD-10-CM | POA: Diagnosis not present

## 2017-04-09 DIAGNOSIS — Z7982 Long term (current) use of aspirin: Secondary | ICD-10-CM | POA: Insufficient documentation

## 2017-04-09 DIAGNOSIS — Z8673 Personal history of transient ischemic attack (TIA), and cerebral infarction without residual deficits: Secondary | ICD-10-CM | POA: Diagnosis not present

## 2017-04-09 LAB — COMPREHENSIVE METABOLIC PANEL
ALT: 22 U/L (ref 14–54)
ANION GAP: 10 (ref 5–15)
AST: 29 U/L (ref 15–41)
Albumin: 4.1 g/dL (ref 3.5–5.0)
Alkaline Phosphatase: 62 U/L (ref 38–126)
BILIRUBIN TOTAL: 0.5 mg/dL (ref 0.3–1.2)
BUN: 46 mg/dL — AB (ref 6–20)
CO2: 21 mmol/L — ABNORMAL LOW (ref 22–32)
Calcium: 9.1 mg/dL (ref 8.9–10.3)
Chloride: 103 mmol/L (ref 101–111)
Creatinine, Ser: 3.62 mg/dL — ABNORMAL HIGH (ref 0.44–1.00)
GFR calc Af Amer: 15 mL/min — ABNORMAL LOW (ref >60)
GFR, EST NON AFRICAN AMERICAN: 13 mL/min — AB (ref >60)
Glucose, Bld: 142 mg/dL — ABNORMAL HIGH (ref 65–99)
POTASSIUM: 4.1 mmol/L (ref 3.5–5.1)
Sodium: 134 mmol/L — ABNORMAL LOW (ref 135–145)
TOTAL PROTEIN: 7.9 g/dL (ref 6.5–8.1)

## 2017-04-09 LAB — CBC WITH DIFFERENTIAL/PLATELET
Basophils Absolute: 0.1 10*3/uL (ref 0–0.1)
Basophils Relative: 3 %
Eosinophils Absolute: 0 10*3/uL (ref 0–0.7)
Eosinophils Relative: 1 %
HEMATOCRIT: 27.5 % — AB (ref 35.0–47.0)
Hemoglobin: 9.9 g/dL — ABNORMAL LOW (ref 12.0–16.0)
LYMPHS ABS: 1.1 10*3/uL (ref 1.0–3.6)
LYMPHS PCT: 31 %
MCH: 36.2 pg — AB (ref 26.0–34.0)
MCHC: 35.8 g/dL (ref 32.0–36.0)
MCV: 101.2 fL — AB (ref 80.0–100.0)
MONO ABS: 0.5 10*3/uL (ref 0.2–0.9)
MONOS PCT: 13 %
NEUTROS ABS: 1.9 10*3/uL (ref 1.4–6.5)
Neutrophils Relative %: 52 %
Platelets: 182 10*3/uL (ref 150–440)
RBC: 2.72 MIL/uL — ABNORMAL LOW (ref 3.80–5.20)
RDW: 13.9 % (ref 11.5–14.5)
WBC: 3.6 10*3/uL (ref 3.6–11.0)

## 2017-04-09 MED ORDER — HEPARIN SOD (PORK) LOCK FLUSH 100 UNIT/ML IV SOLN
500.0000 [IU] | Freq: Once | INTRAVENOUS | Status: AC
Start: 2017-04-09 — End: 2017-04-09
  Administered 2017-04-09: 500 [IU] via INTRAVENOUS

## 2017-04-09 MED ORDER — SODIUM CHLORIDE 0.9% FLUSH
10.0000 mL | INTRAVENOUS | Status: DC | PRN
  Administered 2017-04-09: 10 mL via INTRAVENOUS
  Filled 2017-04-09: qty 10

## 2017-04-09 NOTE — Assessment & Plan Note (Addendum)
#   Left breast cancer stage IV/oligometastatic to the mediastinal lymph nodes currently  with ibrance and Femara since March 2016. May 30th  PET- no progression/improved left breast mass/ stable-treated bone lesions; except for Right acetabular increased uptake [asymptomatic; monitor for now]; but ca-27-29  # Clinically  NO evidence of progression.  Patient will continue Femara with ibrance 125mg  3 w-On; 1 W-off [again start 9/05]. Labs today reviewed;  acceptable for continue.   # Anemia/ IDA-CKD- Hb 9.9-STABLE.  # CKD Stage IV- creat 3.5/ stable; No dialysis yet. appt with Dr.Kolluru in September 2018.    # follow up in 4 weeks/ labs; PET scan few days prior.

## 2017-04-09 NOTE — Progress Notes (Signed)
She is day 2 of her 21 day cycle of Ibrance. Patient has not missed any dosing of her ibrance.  Pt uses [employer]

## 2017-04-09 NOTE — Progress Notes (Signed)
[place] OFFICE PROGRESS NOTE  Patient Care Team: [name], [name]   SUMMARY OF ONCOLOGIC HISTORY:  Oncology History   # OCT 2015-STAGE IV LEFT BREAST T2N1 [T=4cm; [name]; [name]; [name]; EBUS- Positive Paratrac/subcarinal LN s/p ? Taxotere [in [place]; [name]; [name];[name]; [name]; suv 3]; [name]; [name]; [name];  [name]; Ant R iliac bone; inferior sternum- not on X-geva  # Poorly controlled Blood sugars- improved.   # Pancreatitis Hx/ CKD IV [creat ~ 2-3]     Carcinoma of upper-inner quadrant of left breast in female, estrogen receptor positive ([place])   05/23/2016 Initial Diagnosis    Carcinoma of upper-inner quadrant of left breast in female, estrogen receptor positive ([place])       INTERVAL HISTORY:  [name] with above history of metastatic left breast cancer currently on second line therapy with ibrance plus Femara is here for follow-up.   No fevers or chills. She denies any unusual shortness of breath or cough. Denies any swelling of the legs. Denies any significant weight gain Or weight loss. No unusual bone pain.  No diarrhea. No swelling in legs.   REVIEW OF SYSTEMS:  A complete 10 point review of system is done which is negative except mentioned above/history of present illness.   PAST MEDICAL HISTORY :  Past Medical History:  Diagnosis Date  . Asthma   . CHF (congestive heart failure) ([place]) 1997  . CKD (chronic kidney disease)   . Depression   . Diabetes mellitus, type 2 ([place])    . Hair loss   . History of left breast cancer 05/29/14  . History of partial hysterectomy 12/31/2016   Per patient.  Has not had a period in years.  Had a partial hysterectomy years ago.  . Obesity   . Pancreatitis 1997  . Stroke [facility]) 2010   with mild left arm weakness    PAST SURGICAL HISTORY :   Past Surgical History:  Procedure Laterality Date  . CESAREAN SECTION    . CHOLECYSTECTOMY    . PARTIAL HYSTERECTOMY  12/31/2016   Per patient, she has not had a period in years since she had a partial hysterectomy.    FAMILY HISTORY :   Family History  Problem Relation Age of Onset  . Ovarian cancer Mother   . Diabetes Mother   . Hypertension Mother   . COPD Father   . Hypertension Father   . Diabetes Sister   . Diabetes Brother     SOCIAL HISTORY:   Social History  Substance Use Topics  . Smoking status: Former Smoker    Packs/day: 0.50    Years: 1.00    Types: Cigarettes  . Smokeless tobacco: Never Used  . Alcohol use No    ALLERGIES:  has No Known Allergies.  MEDICATIONS:  Current Outpatient Prescriptions  Medication Sig Dispense Refill  . albuterol (PROAIR HFA) 108 (90 BASE) MCG/ACT inhaler Inhale 1 puff into the lungs every 6 (six) hours as needed for shortness of breath.     Marland Kitchen albuterol (PROVENTIL) (2.5 MG/3ML) 0.083% nebulizer solution  Inhale 2.5 mg into the lungs every 6 (six) hours as needed for shortness of breath.     . ALPRAZolam (XANAX) 0.5 MG tablet Take 0.5 mg by mouth at bedtime as needed for anxiety or sleep.     Marland Kitchen amLODipine (NORVASC) 10 MG tablet Take 10 mg by mouth daily.     Marland Kitchen aspirin EC 81 MG tablet Take 81 mg by mouth once.     Marland Kitchen atenolol (TENORMIN) 50 MG tablet TAKE 1 1/2 TABLETS BY MOUTH TWICE DAILY    . B-D ULTRA-FINE 33 LANCETS MISC Use 1 each 2 (two) times daily.    . B-D ULTRAFINE III SHORT PEN 31G X 8 MM MISC     . bumetanide (BUMEX) 0.5 MG tablet TAKE 1 TABLET BY MOUTH TWICE DAILY    . calcitRIOL (ROCALTROL) 0.25 MCG capsule Take  0.25 mcg by mouth 3 (three) times a week.    . Cinnamon 500 MG capsule Take 500 mg by mouth daily.     . cloNIDine (CATAPRES) 0.2 MG tablet Take 0.2 mg by mouth 2 (two) times daily.     . enalapril (VASOTEC) 20 MG tablet Take 20 mg by mouth 2 (two) times daily.     . ferrous sulfate 325 (65 FE) MG tablet Take 325 mg by mouth 2 (two) times daily with a meal.    . FLUoxetine (PROZAC) 20 MG capsule Take 20 mg by mouth 2 (two) times daily.     Marland Kitchen glucose blood (ONE TOUCH ULTRA TEST) test strip Use 1 each 2 (two) times daily. Use as instructed.    . glyBURIDE (DIABETA) 5 MG tablet Take 5 mg by mouth daily with breakfast.     . IBRANCE 125 MG capsule TAKE 1 CAPSULE BY MOUTH ONE TIME DAILY WITH BREAKFAST FOR 3 WEEKS ON FOLLOWED BY 1 WEEK OFF. TAKE WHOLE WITH FOOD. 21 capsule 6  . letrozole (FEMARA) 2.5 MG tablet TAKE 1 TABLET BY MOUTH DAILY 90 tablet 0  . LEVEMIR FLEXTOUCH 100 UNIT/ML Pen Inject 55 Units into the skin daily.     . mometasone (NASONEX) 50 MCG/ACT nasal spray Place 2 sprays into the nose daily as needed.     Marland Kitchen NOVOLOG FLEXPEN 100 UNIT/ML FlexPen Inject 7 Units into the skin 2 (two) times daily at 8 am and 10 pm.     . salmeterol (SEREVENT) 50 MCG/DOSE diskus inhaler Inhale 1 puff into the lungs 2 (two) times daily.    . simvastatin (ZOCOR) 20 MG tablet Take 20 mg by mouth daily at 6 PM.     . vitamin B-12 (CYANOCOBALAMIN) 1000 MCG tablet Take 1,000 mcg by mouth daily.    Marland Kitchen acetaminophen-codeine (TYLENOL #4) 300-60 MG tablet Take 2 tablets by mouth every 6 (six) hours as needed for moderate pain.      No current facility-administered medications for this visit.    Facility-Administered Medications Ordered in Other Visits  Medication Dose Route Frequency Provider Last Rate Last Dose  . sodium chloride flush (NS) 0.9 % injection 10 mL  10 mL Intravenous PRN Cammie Sickle, MD   10 mL at 01/30/16 1054    PHYSICAL EXAMINATION: ECOG PERFORMANCE STATUS: 0 - Asymptomatic  BP 116/75    Pulse 68   Temp 97.8 F (36.6 C) (Tympanic)   Resp 20   Ht 5\' 2"  (1.575 m)   Wt 193 lb 8 oz (87.8 kg)   BMI 35.39 kg/m   Filed Weights   04/09/17 1107  Weight: 193 lb 8 oz (87.8 kg)    GENERAL: Well-nourished well-developed; Alert, no distress and comfortable.  She is Accompanied with family.  EYES: no pallor or icterus OROPHARYNX: no thrush or ulceration; good dentition  NECK: supple, no masses felt LYMPH:  no palpable lymphadenopathy in the cervical, axillary or inguinal regions LUNGS: clear to auscultation and  No wheeze or crackles HEART/CVS: regular rate & rhythm and no murmurs; No lower extremity edema ABDOMEN:abdomen soft, non-tender and normal bowel sounds Musculoskeletal:no cyanosis of digits and no clubbing  PSYCH: alert & oriented x 3 with fluent speech NEURO: no focal motor/sensory deficits SKIN:  Resolved "folliculitis"    LABORATORY DATA:  I have reviewed the data as listed    Component Value Date/Time   NA 134 (L) 04/09/2017 1004   NA 130 (L) 06/06/2014 1102   K 4.1 04/09/2017 1004   K 3.9 06/06/2014 1102   CL 103 04/09/2017 1004   CL 95 (L) 06/06/2014 1102   CO2 21 (L) 04/09/2017 1004   CO2 28 06/06/2014 1102   GLUCOSE 142 (H) 04/09/2017 1004   GLUCOSE 349 (H) 06/06/2014 1102   BUN 46 (H) 04/09/2017 1004   BUN 17 06/06/2014 1102   CREATININE 3.62 (H) 04/09/2017 1004   CREATININE 1.63 (H) 06/06/2014 1102   CALCIUM 9.1 04/09/2017 1004   CALCIUM 9.2 06/06/2014 1102   PROT 7.9 04/09/2017 1004   PROT 8.2 06/06/2014 1102   ALBUMIN 4.1 04/09/2017 1004   ALBUMIN 3.3 (L) 06/06/2014 1102   AST 29 04/09/2017 1004   AST 7 (L) 06/06/2014 1102   ALT 22 04/09/2017 1004   ALT 12 (L) 06/06/2014 1102   ALKPHOS 62 04/09/2017 1004   ALKPHOS 74 06/06/2014 1102   BILITOT 0.5 04/09/2017 1004   BILITOT 0.4 06/06/2014 1102   GFRNONAA 13 (L) 04/09/2017 1004   GFRNONAA 35 (L) 06/06/2014 1102   GFRAA 15 (L) 04/09/2017 1004   GFRAA 42 (L) 06/06/2014 1102    No  results found for: SPEP, UPEP  Lab Results  Component Value Date   WBC 3.6 04/09/2017   NEUTROABS 1.9 04/09/2017   HGB 9.9 (L) 04/09/2017   HCT 27.5 (L) 04/09/2017   MCV 101.2 (H) 04/09/2017   PLT 182 04/09/2017      Chemistry      Component Value Date/Time   NA 134 (L) 04/09/2017 1004   NA 130 (L) 06/06/2014 1102   K 4.1 04/09/2017 1004   K 3.9 06/06/2014 1102   CL 103 04/09/2017 1004   CL 95 (L) 06/06/2014 1102   CO2 21 (L) 04/09/2017 1004   CO2 28 06/06/2014 1102   BUN 46 (H) 04/09/2017 1004   BUN 17 06/06/2014 1102   CREATININE 3.62 (H) 04/09/2017 1004   CREATININE 1.63 (H) 06/06/2014 1102      Component Value Date/Time   CALCIUM 9.1 04/09/2017 1004   CALCIUM 9.2 06/06/2014 1102   ALKPHOS 62 04/09/2017 1004   ALKPHOS 74 06/06/2014 1102   AST 29 04/09/2017 1004   AST 7 (L) 06/06/2014 1102   ALT 22 04/09/2017 1004   ALT 12 (L) 06/06/2014 1102   BILITOT 0.5 04/09/2017 1004   BILITOT 0.4 06/06/2014 1102     Results for Lawhead, Yanisa A (MRN [id number]) as of 12/21/2016 17:50  Ref. Range 06/20/2016 09:10 08/29/2016 09:57 09/26/2016 09:35 11/21/2016 09:44 12/19/2016 13:05  CA 27.29 Latest Ref Range: 0.0 - 38.6 U/mL 51.0 (H) 56.8 (H) 52.8 (H) 67.3 (H) 70.7 (H)  ASSESSMENT & PLAN:  Carcinoma of upper-inner quadrant of left breast in female, estrogen receptor positive ([place]) # Left breast cancer stage IV/oligometastatic to the mediastinal lymph nodes currently  with ibrance and Femara since March 2016. May 30th  PET- no progression/improved left breast mass/ stable-treated bone lesions; except for Right acetabular increased uptake [asymptomatic; monitor for now]; but ca-27-29  # Clinically  NO evidence of progression.  Patient will continue Femara with ibrance 125mg  3 w-On; 1 W-off [again start 9/05]. Labs today reviewed;  acceptable for continue.   # Anemia/ IDA-CKD- Hb 9.9-STABLE.  # CKD Stage IV- creat 3.5/ stable; No dialysis yet. appt with Dr.Kolluru in September  2018.    # follow up in 4 weeks/ labs; PET scan few days prior.       Cammie Sickle, MD 04/19/2017 8:21 PM

## 2017-04-10 LAB — CANCER ANTIGEN 27.29: CA 27.29: 92.2 U/mL — ABNORMAL HIGH (ref 0.0–38.6)

## 2017-04-23 DIAGNOSIS — D631 Anemia in chronic kidney disease: Secondary | ICD-10-CM | POA: Diagnosis not present

## 2017-04-23 DIAGNOSIS — N185 Chronic kidney disease, stage 5: Secondary | ICD-10-CM | POA: Diagnosis not present

## 2017-04-23 DIAGNOSIS — I120 Hypertensive chronic kidney disease with stage 5 chronic kidney disease or end stage renal disease: Secondary | ICD-10-CM | POA: Diagnosis not present

## 2017-04-23 DIAGNOSIS — R809 Proteinuria, unspecified: Secondary | ICD-10-CM | POA: Diagnosis not present

## 2017-04-23 DIAGNOSIS — N2581 Secondary hyperparathyroidism of renal origin: Secondary | ICD-10-CM | POA: Diagnosis not present

## 2017-04-23 DIAGNOSIS — E1122 Type 2 diabetes mellitus with diabetic chronic kidney disease: Secondary | ICD-10-CM | POA: Diagnosis not present

## 2017-05-05 ENCOUNTER — Ambulatory Visit: Admission: RE | Admit: 2017-05-05 | Payer: Medicare Other | Source: Ambulatory Visit

## 2017-05-06 ENCOUNTER — Ambulatory Visit
Admission: RE | Admit: 2017-05-06 | Discharge: 2017-05-06 | Disposition: A | Discharge status: Home / Self Care | Payer: Medicare Other | Source: Ambulatory Visit | Attending: Internal Medicine | Admitting: Internal Medicine

## 2017-05-06 ENCOUNTER — Other Ambulatory Visit: Payer: Self-pay | Admitting: *Deleted

## 2017-05-06 DIAGNOSIS — C50212 Malignant neoplasm of upper-inner quadrant of left female breast: Secondary | ICD-10-CM

## 2017-05-06 DIAGNOSIS — Z170 Estrogen receptor positive status [ER+]: Principal | ICD-10-CM

## 2017-05-07 ENCOUNTER — Inpatient Hospital Stay: Payer: Medicare Other | Attending: Internal Medicine

## 2017-05-07 ENCOUNTER — Inpatient Hospital Stay (HOSPITAL_BASED_OUTPATIENT_CLINIC_OR_DEPARTMENT_OTHER): Payer: Medicare Other | Admitting: Internal Medicine

## 2017-05-07 VITALS — BP 151/89 | HR 65 | Temp 97.6°F | Resp 20 | Ht 62.0 in | Wt 193.0 lb

## 2017-05-07 DIAGNOSIS — Z79811 Long term (current) use of aromatase inhibitors: Secondary | ICD-10-CM | POA: Insufficient documentation

## 2017-05-07 DIAGNOSIS — I130 Hypertensive heart and chronic kidney disease with heart failure and stage 1 through stage 4 chronic kidney disease, or unspecified chronic kidney disease: Secondary | ICD-10-CM

## 2017-05-07 DIAGNOSIS — Z8041 Family history of malignant neoplasm of ovary: Secondary | ICD-10-CM

## 2017-05-07 DIAGNOSIS — Z79899 Other long term (current) drug therapy: Secondary | ICD-10-CM | POA: Insufficient documentation

## 2017-05-07 DIAGNOSIS — Z170 Estrogen receptor positive status [ER+]: Secondary | ICD-10-CM | POA: Diagnosis not present

## 2017-05-07 DIAGNOSIS — C50212 Malignant neoplasm of upper-inner quadrant of left female breast: Secondary | ICD-10-CM | POA: Diagnosis not present

## 2017-05-07 DIAGNOSIS — N184 Chronic kidney disease, stage 4 (severe): Secondary | ICD-10-CM | POA: Diagnosis not present

## 2017-05-07 DIAGNOSIS — E1122 Type 2 diabetes mellitus with diabetic chronic kidney disease: Secondary | ICD-10-CM

## 2017-05-07 DIAGNOSIS — C7951 Secondary malignant neoplasm of bone: Secondary | ICD-10-CM | POA: Insufficient documentation

## 2017-05-07 DIAGNOSIS — I509 Heart failure, unspecified: Secondary | ICD-10-CM | POA: Diagnosis not present

## 2017-05-07 DIAGNOSIS — D509 Iron deficiency anemia, unspecified: Secondary | ICD-10-CM

## 2017-05-07 DIAGNOSIS — E1165 Type 2 diabetes mellitus with hyperglycemia: Secondary | ICD-10-CM | POA: Insufficient documentation

## 2017-05-07 DIAGNOSIS — I69354 Hemiplegia and hemiparesis following cerebral infarction affecting left non-dominant side: Secondary | ICD-10-CM | POA: Diagnosis not present

## 2017-05-07 DIAGNOSIS — I252 Old myocardial infarction: Secondary | ICD-10-CM | POA: Insufficient documentation

## 2017-05-07 DIAGNOSIS — Z87891 Personal history of nicotine dependence: Secondary | ICD-10-CM | POA: Diagnosis not present

## 2017-05-07 LAB — CBC WITH DIFFERENTIAL/PLATELET
BASOS ABS: 0.1 10*3/uL (ref 0–0.1)
BASOS PCT: 3 %
EOS ABS: 0 10*3/uL (ref 0–0.7)
Eosinophils Relative: 1 %
HEMATOCRIT: 28.3 % — AB (ref 35.0–47.0)
HEMOGLOBIN: 10 g/dL — AB (ref 12.0–16.0)
Lymphocytes Relative: 33 %
Lymphs Abs: 0.9 10*3/uL — ABNORMAL LOW (ref 1.0–3.6)
MCH: 36.3 pg — ABNORMAL HIGH (ref 26.0–34.0)
MCHC: 35.5 g/dL (ref 32.0–36.0)
MCV: 102.4 fL — ABNORMAL HIGH (ref 80.0–100.0)
MONOS PCT: 16 %
Monocytes Absolute: 0.4 10*3/uL (ref 0.2–0.9)
NEUTROS ABS: 1.3 10*3/uL — AB (ref 1.4–6.5)
NEUTROS PCT: 47 %
Platelets: 164 10*3/uL (ref 150–440)
RBC: 2.77 MIL/uL — AB (ref 3.80–5.20)
RDW: 13.7 % (ref 11.5–14.5)
WBC: 2.8 10*3/uL — AB (ref 3.6–11.0)

## 2017-05-07 LAB — COMPREHENSIVE METABOLIC PANEL
ALBUMIN: 4.2 g/dL (ref 3.5–5.0)
ALT: 14 U/L (ref 14–54)
ANION GAP: 7 (ref 5–15)
AST: 18 U/L (ref 15–41)
Alkaline Phosphatase: 57 U/L (ref 38–126)
BILIRUBIN TOTAL: 0.5 mg/dL (ref 0.3–1.2)
BUN: 56 mg/dL — ABNORMAL HIGH (ref 6–20)
CHLORIDE: 108 mmol/L (ref 101–111)
CO2: 22 mmol/L (ref 22–32)
Calcium: 8.9 mg/dL (ref 8.9–10.3)
Creatinine, Ser: 4.01 mg/dL — ABNORMAL HIGH (ref 0.44–1.00)
GFR calc Af Amer: 13 mL/min — ABNORMAL LOW (ref >60)
GFR calc non Af Amer: 11 mL/min — ABNORMAL LOW (ref >60)
GLUCOSE: 121 mg/dL — AB (ref 65–99)
POTASSIUM: 4.7 mmol/L (ref 3.5–5.1)
SODIUM: 137 mmol/L (ref 135–145)
TOTAL PROTEIN: 7.9 g/dL (ref 6.5–8.1)

## 2017-05-07 NOTE — Progress Notes (Signed)
Patient unable to get her pet scan due to machine in nuc med being down. She is sch for next Wednesday to obtain the pet scan.

## 2017-05-07 NOTE — Assessment & Plan Note (Addendum)
#   Left breast cancer stage IV/oligometastatic to the mediastinal lymph nodes currently  with ibrance and Femara since March 2016. May 30th  PET- no progression/improved left breast mass/ stable-treated bone lesions; except for Right acetabular increased uptake [asymptomatic; monitor for now]; Ca-27-29-RISING; awaiting Repeat PET scan-next week.   # Clinically  NO evidence of progression.  Patient will continue Femara with ibrance 125mg  3 w-On; 1 W-off [again start 9/05]. Labs today reviewed;  acceptable for continue. Await PET scan pending.   # Anemia/ IDA-CKD- Hb 9.9-STABLE.  # CKD Stage IV- creat 3.5/ stable; No dialysis yet. appt with Dr.Kolluru in September 2018.    # Letter for OTC medications-   # follow up in 4 weeks/ labs; will call re: PET scan.

## 2017-05-08 LAB — CANCER ANTIGEN 27.29: CA 27.29: 98 U/mL — ABNORMAL HIGH (ref 0.0–38.6)

## 2017-05-08 NOTE — Progress Notes (Signed)
[place] OFFICE PROGRESS NOTE  Patient Care Team: [name], [name]   SUMMARY OF ONCOLOGIC HISTORY:  Oncology History   # OCT 2015-STAGE IV LEFT BREAST T2N1 [T=4cm; [name]; [name]; [name]; EBUS- Positive Paratrac/subcarinal LN s/p ? Taxotere [in [place]; [name]; [name];[name]; [name]; suv 3]; [name]; [name]; [name];  [name]; Ant R iliac bone; inferior sternum- not on X-geva  # Poorly controlled Blood sugars- improved.   # Pancreatitis Hx/ CKD IV [creat ~ 2-3]     Carcinoma of upper-inner quadrant of left breast in female, estrogen receptor positive ([place])   05/23/2016 Initial Diagnosis    Carcinoma of upper-inner quadrant of left breast in female, estrogen receptor positive ([place])       INTERVAL HISTORY:  [name] with above history of metastatic left breast cancer currently on second line therapy with ibrance plus Femara is here for follow-up.  Patient was unable to get her PET scan because of breakdown of the PET scanner.   Denies any significant weight gain Or weight loss. No unusual bone pain.  No diarrhea. No swelling in legs. No fevers or chills. She denies any unusual shortness of breath or cough. Denies any headaches.   REVIEW OF SYSTEMS:  A complete 10 point review of system is done which is negative except mentioned above/history of present illness.   PAST MEDICAL HISTORY :  Past Medical History:  Diagnosis Date  . Asthma   . CHF (congestive heart failure) ([place]) 1997  . CKD (chronic  kidney disease)   . Depression   . Diabetes mellitus, type 2 ([place])   . Hair loss   . History of left breast cancer 05/29/14  . History of partial hysterectomy 12/31/2016   Per patient.  Has not had a period in years.  Had a partial hysterectomy years ago.  . Obesity   . Pancreatitis 1997  . Stroke [facility]) 2010   with mild left arm weakness    PAST SURGICAL HISTORY :   Past Surgical History:  Procedure Laterality Date  . CESAREAN SECTION    . CHOLECYSTECTOMY    . PARTIAL HYSTERECTOMY  12/31/2016   Per patient, she has not had a period in years since she had a partial hysterectomy.    FAMILY HISTORY :   Family History  Problem Relation Age of Onset  . Ovarian cancer Mother   . Diabetes Mother   . Hypertension Mother   . COPD Father   . Hypertension Father   . Diabetes Sister   . Diabetes Brother     SOCIAL HISTORY:   Social History  Substance Use Topics  . Smoking status: Former Smoker    Packs/day: 0.50    Years: 1.00    Types: Cigarettes  . Smokeless tobacco: Never Used  . Alcohol use No    ALLERGIES:  has No Known Allergies.  MEDICATIONS:  Current Outpatient Prescriptions  Medication Sig Dispense Refill  . acetaminophen-codeine (TYLENOL #4) 300-60 MG tablet Take 2 tablets by mouth every 6 (six) hours as needed for moderate pain.     Marland Kitchen  albuterol (PROAIR HFA) 108 (90 BASE) MCG/ACT inhaler Inhale 1 puff into the lungs every 6 (six) hours as needed for shortness of breath.     Marland Kitchen albuterol (PROVENTIL) (2.5 MG/3ML) 0.083% nebulizer solution Inhale 2.5 mg into the lungs every 6 (six) hours as needed for shortness of breath.     . ALPRAZolam (XANAX) 0.5 MG tablet Take 0.5 mg by mouth at bedtime as needed for anxiety or sleep.     Marland Kitchen amLODipine (NORVASC) 10 MG tablet Take 10 mg by mouth daily.     Marland Kitchen aspirin EC 81 MG tablet Take 81 mg by mouth once.     Marland Kitchen atenolol (TENORMIN) 50 MG tablet TAKE 1 1/2 TABLETS BY MOUTH TWICE DAILY    . B-D ULTRA-FINE 33 LANCETS MISC Use 1  each 2 (two) times daily.    . B-D ULTRAFINE III SHORT PEN 31G X 8 MM MISC     . bumetanide (BUMEX) 0.5 MG tablet TAKE 1 TABLET BY MOUTH TWICE DAILY    . calcitRIOL (ROCALTROL) 0.25 MCG capsule Take 0.25 mcg by mouth 3 (three) times a week.    . Cinnamon 500 MG capsule Take 500 mg by mouth daily.     . cloNIDine (CATAPRES) 0.2 MG tablet Take 0.2 mg by mouth 2 (two) times daily.     . enalapril (VASOTEC) 20 MG tablet Take 20 mg by mouth 2 (two) times daily.     . ferrous sulfate 325 (65 FE) MG tablet Take 325 mg by mouth 2 (two) times daily with a meal.    . FLUoxetine (PROZAC) 20 MG capsule Take 20 mg by mouth 2 (two) times daily.     Marland Kitchen glucose blood (ONE TOUCH ULTRA TEST) test strip Use 1 each 2 (two) times daily. Use as instructed.    . glyBURIDE (DIABETA) 5 MG tablet Take 5 mg by mouth daily with breakfast.     . IBRANCE 125 MG capsule TAKE 1 CAPSULE BY MOUTH ONE TIME DAILY WITH BREAKFAST FOR 3 WEEKS ON FOLLOWED BY 1 WEEK OFF. TAKE WHOLE WITH FOOD. 21 capsule 6  . letrozole (FEMARA) 2.5 MG tablet TAKE 1 TABLET BY MOUTH DAILY 90 tablet 0  . LEVEMIR FLEXTOUCH 100 UNIT/ML Pen Inject 55 Units into the skin daily.     . mometasone (NASONEX) 50 MCG/ACT nasal spray Place 2 sprays into the nose daily as needed.     Marland Kitchen NOVOLOG FLEXPEN 100 UNIT/ML FlexPen Inject 7 Units into the skin 2 (two) times daily at 8 am and 10 pm.     . salmeterol (SEREVENT) 50 MCG/DOSE diskus inhaler Inhale 1 puff into the lungs 2 (two) times daily.    . simvastatin (ZOCOR) 20 MG tablet Take 20 mg by mouth daily at 6 PM.     . vitamin B-12 (CYANOCOBALAMIN) 1000 MCG tablet Take 1,000 mcg by mouth daily.     No current facility-administered medications for this visit.    Facility-Administered Medications Ordered in Other Visits  Medication Dose Route Frequency Provider Last Rate Last Dose  . sodium chloride flush (NS) 0.9 % injection 10 mL  10 mL Intravenous PRN Cammie Sickle, MD   10 mL at 01/30/16 1054    PHYSICAL  EXAMINATION: ECOG PERFORMANCE STATUS: 0 - Asymptomatic  BP (!) 151/89   Pulse 65   Temp 97.6 F (36.4 C) (Tympanic)   Resp 20   Ht 5\' 2"  (1.575 m)   Wt 193 lb (87.5 kg)   BMI 35.30  kg/m   Filed Weights   05/07/17 1053  Weight: 193 lb (87.5 kg)    GENERAL: Well-nourished well-developed; Alert, no distress and comfortable.  She is alone.  EYES: no pallor or icterus OROPHARYNX: no thrush or ulceration; good dentition  NECK: supple, no masses felt LYMPH:  no palpable lymphadenopathy in the cervical, axillary or inguinal regions LUNGS: clear to auscultation and  No wheeze or crackles HEART/CVS: regular rate & rhythm and no murmurs; No lower extremity edema ABDOMEN:abdomen soft, non-tender and normal bowel sounds Musculoskeletal:no cyanosis of digits and no clubbing  PSYCH: alert & oriented x 3 with fluent speech NEURO: no focal motor/sensory deficits SKIN:  Resolved "folliculitis"    LABORATORY DATA:  I have reviewed the data as listed    Component Value Date/Time   NA 137 05/07/2017 0934   NA 130 (L) 06/06/2014 1102   K 4.7 05/07/2017 0934   K 3.9 06/06/2014 1102   CL 108 05/07/2017 0934   CL 95 (L) 06/06/2014 1102   CO2 22 05/07/2017 0934   CO2 28 06/06/2014 1102   GLUCOSE 121 (H) 05/07/2017 0934   GLUCOSE 349 (H) 06/06/2014 1102   BUN 56 (H) 05/07/2017 0934   BUN 17 06/06/2014 1102   CREATININE 4.01 (H) 05/07/2017 0934   CREATININE 1.63 (H) 06/06/2014 1102   CALCIUM 8.9 05/07/2017 0934   CALCIUM 9.2 06/06/2014 1102   PROT 7.9 05/07/2017 0934   PROT 8.2 06/06/2014 1102   ALBUMIN 4.2 05/07/2017 0934   ALBUMIN 3.3 (L) 06/06/2014 1102   AST 18 05/07/2017 0934   AST 7 (L) 06/06/2014 1102   ALT 14 05/07/2017 0934   ALT 12 (L) 06/06/2014 1102   ALKPHOS 57 05/07/2017 0934   ALKPHOS 74 06/06/2014 1102   BILITOT 0.5 05/07/2017 0934   BILITOT 0.4 06/06/2014 1102   GFRNONAA 11 (L) 05/07/2017 0934   GFRNONAA 35 (L) 06/06/2014 1102   GFRAA 13 (L) 05/07/2017 0934    GFRAA 42 (L) 06/06/2014 1102    No results found for: SPEP, UPEP  Lab Results  Component Value Date   WBC 2.8 (L) 05/07/2017   NEUTROABS 1.3 (L) 05/07/2017   HGB 10.0 (L) 05/07/2017   HCT 28.3 (L) 05/07/2017   MCV 102.4 (H) 05/07/2017   PLT 164 05/07/2017      Chemistry      Component Value Date/Time   NA 137 05/07/2017 0934   NA 130 (L) 06/06/2014 1102   K 4.7 05/07/2017 0934   K 3.9 06/06/2014 1102   CL 108 05/07/2017 0934   CL 95 (L) 06/06/2014 1102   CO2 22 05/07/2017 0934   CO2 28 06/06/2014 1102   BUN 56 (H) 05/07/2017 0934   BUN 17 06/06/2014 1102   CREATININE 4.01 (H) 05/07/2017 0934   CREATININE 1.63 (H) 06/06/2014 1102      Component Value Date/Time   CALCIUM 8.9 05/07/2017 0934   CALCIUM 9.2 06/06/2014 1102   ALKPHOS 57 05/07/2017 0934   ALKPHOS 74 06/06/2014 1102   AST 18 05/07/2017 0934   AST 7 (L) 06/06/2014 1102   ALT 14 05/07/2017 0934   ALT 12 (L) 06/06/2014 1102   BILITOT 0.5 05/07/2017 0934   BILITOT 0.4 06/06/2014 1102      Results for OLUWATOBI, RUPPE A (MRN [id number]) as of 05/08/2017 08:29  Ref. Range 12/19/2016 13:05 02/02/2017 09:15 03/09/2017 10:55 04/09/2017 10:04 05/07/2017 09:34  CA 27.29 Latest Ref Range: 0.0 - 38.6 U/mL 70.7 (H) 74.9 (H)  CA 27.29 Latest Ref Range: 0.0 - 38.6 U/mL   89.6 (H) 92.2 (H) 98.0 (H)     ASSESSMENT & PLAN:  Carcinoma of upper-inner quadrant of left breast in female, estrogen receptor positive ([place]) # Left breast cancer stage IV/oligometastatic to the mediastinal lymph nodes currently  with ibrance and Femara since March 2016. May 30th  PET- no progression/improved left breast mass/ stable-treated bone lesions; except for Right acetabular increased uptake [asymptomatic; monitor for now]; Ca-27-29-RISING; awaiting Repeat PET scan-next week.   # Clinically  NO evidence of progression.  Patient will continue Femara with ibrance 125mg  3 w-On; 1 W-off [again start 9/05]. Labs today reviewed;  acceptable for continue.  Await PET scan pending.   # Anemia/ IDA-CKD- Hb 9.9-STABLE.  # CKD Stage IV- creat 3.5/ stable; No dialysis yet. appt with Dr.Kolluru in September 2018.    # Letter for OTC medications-   # follow up in 4 weeks/ labs; will call re: PET scan.       Cammie Sickle, MD 05/08/2017 8:31 AM

## 2017-05-11 ENCOUNTER — Encounter: Payer: Self-pay | Admitting: *Deleted

## 2017-05-13 ENCOUNTER — Encounter
Admission: RE | Admit: 2017-05-13 | Discharge: 2017-05-13 | Disposition: A | Discharge status: Home / Self Care | Payer: Medicare Other | Source: Ambulatory Visit | Attending: Internal Medicine | Admitting: Internal Medicine

## 2017-05-13 DIAGNOSIS — Z79899 Other long term (current) drug therapy: Secondary | ICD-10-CM | POA: Insufficient documentation

## 2017-05-13 DIAGNOSIS — M799 Soft tissue disorder, unspecified: Secondary | ICD-10-CM | POA: Insufficient documentation

## 2017-05-13 DIAGNOSIS — C7951 Secondary malignant neoplasm of bone: Secondary | ICD-10-CM | POA: Diagnosis not present

## 2017-05-13 DIAGNOSIS — R911 Solitary pulmonary nodule: Secondary | ICD-10-CM | POA: Diagnosis not present

## 2017-05-13 DIAGNOSIS — Z170 Estrogen receptor positive status [ER+]: Secondary | ICD-10-CM | POA: Diagnosis not present

## 2017-05-13 DIAGNOSIS — I700 Atherosclerosis of aorta: Secondary | ICD-10-CM | POA: Insufficient documentation

## 2017-05-13 DIAGNOSIS — C50912 Malignant neoplasm of unspecified site of left female breast: Secondary | ICD-10-CM | POA: Diagnosis not present

## 2017-05-13 DIAGNOSIS — C50212 Malignant neoplasm of upper-inner quadrant of left female breast: Secondary | ICD-10-CM | POA: Diagnosis not present

## 2017-05-13 LAB — GLUCOSE, CAPILLARY: Glucose-Capillary: 133 mg/dL — ABNORMAL HIGH (ref 65–99)

## 2017-05-13 MED ORDER — FLUDEOXYGLUCOSE F - 18 (FDG) INJECTION
11.9400 | Freq: Once | INTRAVENOUS | Status: AC | PRN
  Administered 2017-05-13: 11.94 via INTRAVENOUS

## 2017-05-15 ENCOUNTER — Telehealth: Payer: Self-pay | Admitting: Internal Medicine

## 2017-05-15 NOTE — Telephone Encounter (Signed)
[name]- Please inform pt that PET is STABLE;[ shows disease fairly unchanged from previous scan]; no new sites of disease noted. Will review scan with pt at next visit.

## 2017-05-15 NOTE — Telephone Encounter (Signed)
Patient notified of these results and verbalized understanding.

## 2017-06-04 ENCOUNTER — Inpatient Hospital Stay: Payer: Medicare Other | Attending: Internal Medicine | Admitting: Internal Medicine

## 2017-06-04 ENCOUNTER — Inpatient Hospital Stay: Payer: Medicare Other

## 2017-06-04 VITALS — BP 164/91 | HR 64 | Resp 16 | Wt 193.0 lb

## 2017-06-04 DIAGNOSIS — C7951 Secondary malignant neoplasm of bone: Secondary | ICD-10-CM | POA: Diagnosis not present

## 2017-06-04 DIAGNOSIS — Z8041 Family history of malignant neoplasm of ovary: Secondary | ICD-10-CM | POA: Diagnosis not present

## 2017-06-04 DIAGNOSIS — N184 Chronic kidney disease, stage 4 (severe): Secondary | ICD-10-CM | POA: Diagnosis not present

## 2017-06-04 DIAGNOSIS — I130 Hypertensive heart and chronic kidney disease with heart failure and stage 1 through stage 4 chronic kidney disease, or unspecified chronic kidney disease: Secondary | ICD-10-CM | POA: Diagnosis not present

## 2017-06-04 DIAGNOSIS — Z170 Estrogen receptor positive status [ER+]: Secondary | ICD-10-CM

## 2017-06-04 DIAGNOSIS — E1165 Type 2 diabetes mellitus with hyperglycemia: Secondary | ICD-10-CM

## 2017-06-04 DIAGNOSIS — Z8673 Personal history of transient ischemic attack (TIA), and cerebral infarction without residual deficits: Secondary | ICD-10-CM | POA: Diagnosis not present

## 2017-06-04 DIAGNOSIS — Z79899 Other long term (current) drug therapy: Secondary | ICD-10-CM | POA: Diagnosis not present

## 2017-06-04 DIAGNOSIS — C50212 Malignant neoplasm of upper-inner quadrant of left female breast: Secondary | ICD-10-CM | POA: Diagnosis not present

## 2017-06-04 DIAGNOSIS — I700 Atherosclerosis of aorta: Secondary | ICD-10-CM | POA: Diagnosis not present

## 2017-06-04 DIAGNOSIS — Z79811 Long term (current) use of aromatase inhibitors: Secondary | ICD-10-CM | POA: Diagnosis not present

## 2017-06-04 DIAGNOSIS — Z87891 Personal history of nicotine dependence: Secondary | ICD-10-CM | POA: Diagnosis not present

## 2017-06-04 DIAGNOSIS — Z95828 Presence of other vascular implants and grafts: Secondary | ICD-10-CM

## 2017-06-04 DIAGNOSIS — Z7982 Long term (current) use of aspirin: Secondary | ICD-10-CM | POA: Diagnosis not present

## 2017-06-04 DIAGNOSIS — Z7951 Long term (current) use of inhaled steroids: Secondary | ICD-10-CM | POA: Diagnosis not present

## 2017-06-04 DIAGNOSIS — E1122 Type 2 diabetes mellitus with diabetic chronic kidney disease: Secondary | ICD-10-CM | POA: Diagnosis not present

## 2017-06-04 DIAGNOSIS — I509 Heart failure, unspecified: Secondary | ICD-10-CM | POA: Insufficient documentation

## 2017-06-04 LAB — COMPREHENSIVE METABOLIC PANEL
ALK PHOS: 61 U/L (ref 38–126)
ALT: 15 U/L (ref 14–54)
AST: 19 U/L (ref 15–41)
Albumin: 4.2 g/dL (ref 3.5–5.0)
Anion gap: 9 (ref 5–15)
BUN: 50 mg/dL — AB (ref 6–20)
CHLORIDE: 101 mmol/L (ref 101–111)
CO2: 21 mmol/L — AB (ref 22–32)
CREATININE: 3.58 mg/dL — AB (ref 0.44–1.00)
Calcium: 9 mg/dL (ref 8.9–10.3)
GFR calc Af Amer: 15 mL/min — ABNORMAL LOW (ref >60)
GFR, EST NON AFRICAN AMERICAN: 13 mL/min — AB (ref >60)
Glucose, Bld: 216 mg/dL — ABNORMAL HIGH (ref 65–99)
Potassium: 4.8 mmol/L (ref 3.5–5.1)
Sodium: 131 mmol/L — ABNORMAL LOW (ref 135–145)
Total Bilirubin: 0.6 mg/dL (ref 0.3–1.2)
Total Protein: 7.9 g/dL (ref 6.5–8.1)

## 2017-06-04 LAB — CBC WITH DIFFERENTIAL/PLATELET
BASOS ABS: 0.1 10*3/uL (ref 0–0.1)
Basophils Relative: 4 %
EOS PCT: 0 %
Eosinophils Absolute: 0 10*3/uL (ref 0–0.7)
HCT: 29.3 % — ABNORMAL LOW (ref 35.0–47.0)
HEMOGLOBIN: 10.1 g/dL — AB (ref 12.0–16.0)
LYMPHS PCT: 22 %
Lymphs Abs: 0.8 10*3/uL — ABNORMAL LOW (ref 1.0–3.6)
MCH: 35.9 pg — ABNORMAL HIGH (ref 26.0–34.0)
MCHC: 34.4 g/dL (ref 32.0–36.0)
MCV: 104.3 fL — AB (ref 80.0–100.0)
Monocytes Absolute: 0.4 10*3/uL (ref 0.2–0.9)
Monocytes Relative: 11 %
NEUTROS ABS: 2.5 10*3/uL (ref 1.4–6.5)
NEUTROS PCT: 63 %
PLATELETS: 206 10*3/uL (ref 150–440)
RBC: 2.81 MIL/uL — AB (ref 3.80–5.20)
RDW: 13.5 % (ref 11.5–14.5)
WBC: 3.9 10*3/uL (ref 3.6–11.0)

## 2017-06-04 MED ORDER — HEPARIN SOD (PORK) LOCK FLUSH 100 UNIT/ML IV SOLN
500.0000 [IU] | INTRAVENOUS | Status: AC | PRN
  Administered 2017-06-04: 500 [IU]

## 2017-06-04 MED ORDER — SODIUM CHLORIDE 0.9% FLUSH
10.0000 mL | INTRAVENOUS | Status: AC | PRN
  Administered 2017-06-04: 10 mL
  Filled 2017-06-04: qty 10

## 2017-06-04 NOTE — Progress Notes (Signed)
[place] OFFICE PROGRESS NOTE  Patient Care Team: [name], [name] as PCP - General (Internal Medicine)   SUMMARY OF ONCOLOGIC HISTORY:  Oncology History   # OCT 2015-STAGE IV LEFT BREAST T2N1 [T=4cm; [name]; [name]; [name]; EBUS- Positive Paratrac/subcarinal LN s/p ? Taxotere [in [employer]; [name]; [name];[name]; [name]; suv 3]; [name]; [name]; [name];  [name]; [name]; inferior sternum- not on X-geva  # Poorly controlled Blood sugars- improved.   # Pancreatitis Hx/ CKD IV [creat ~ 2-3]     Carcinoma of upper-inner quadrant of left breast in female, estrogen receptor positive ([place])   05/23/2016 Initial Diagnosis    Carcinoma of upper-inner quadrant of left breast in female, estrogen receptor positive ([place])       INTERVAL HISTORY:  [name] very pleasant 61 year old female patient with above history of metastatic left breast cancer currently on second line therapy with ibrance plus Femara is here for follow-up/ review the results of the PET scan.  Denies any significant weight gain Or weight loss. No unusual bone pain.  No diarrhea. No swelling in legs. No fevers or chills. She denies any unusual shortness of breath or cough. Denies any headaches.   REVIEW OF SYSTEMS:  [name] complete 10 point review of system is done which is negative except mentioned above/history of present illness.   PAST MEDICAL HISTORY :  Past Medical History:  Diagnosis Date  . Asthma   . CHF (congestive heart failure) ([place]) 1997  . CKD (chronic kidney disease)   . Depression   . Diabetes  mellitus, type 2 ([place])   . Hair loss   . History of left breast cancer 05/29/14  . History of partial hysterectomy 12/31/2016   Per patient.  Has not had [name] period in years.  Had [name] partial hysterectomy years ago.  . Obesity   . Pancreatitis 1997  . Stroke [facility]) 2010   with mild left arm weakness    PAST SURGICAL HISTORY :   Past Surgical History:  Procedure Laterality Date  . CESAREAN SECTION    . CHOLECYSTECTOMY    . PARTIAL HYSTERECTOMY  12/31/2016   Per patient, she has not had [name] period in years since she had [name] partial hysterectomy.    FAMILY HISTORY :   Family History  Problem Relation Age of Onset  . Ovarian cancer Mother   . Diabetes Mother   . Hypertension Mother   . COPD Father   . Hypertension Father   . Diabetes Sister   . Diabetes Brother     SOCIAL HISTORY:   Social History   Tobacco Use  . Smoking status: Former Smoker    Packs/day: 0.50    Years: 1.00    Pack years: 0.50    Types: Cigarettes  . Smokeless tobacco: Never Used  Substance Use Topics  . Alcohol use: No    Alcohol/week: 0.0 oz  . Drug use: No    ALLERGIES:  has No Known Allergies.  MEDICATIONS:  Current Outpatient Medications  Medication Sig Dispense Refill  . acetaminophen-codeine (TYLENOL #4) 300-60 MG tablet Take 2 tablets by mouth every  6 (six) hours as needed for moderate pain.     Marland Kitchen albuterol (PROAIR HFA) 108 (90 BASE) MCG/ACT inhaler Inhale 1 puff into the lungs every 6 (six) hours as needed for shortness of breath.     Marland Kitchen albuterol (PROVENTIL) (2.5 MG/3ML) 0.083% nebulizer solution Inhale 2.5 mg into the lungs every 6 (six) hours as needed for shortness of breath.     . ALPRAZolam (XANAX) 0.5 MG tablet Take 0.5 mg by mouth at bedtime as needed for anxiety or sleep.     Marland Kitchen amLODipine (NORVASC) 10 MG tablet Take 10 mg by mouth daily.     Marland Kitchen aspirin EC 81 MG tablet Take 81 mg by mouth once.     Marland Kitchen atenolol (TENORMIN) 50 MG tablet TAKE 1 1/2 TABLETS BY MOUTH TWICE DAILY    . B-D  ULTRA-FINE 33 LANCETS MISC Use 1 each 2 (two) times daily.    . B-D ULTRAFINE III SHORT PEN 31G X 8 MM MISC     . bumetanide (BUMEX) 0.5 MG tablet TAKE 1 TABLET BY MOUTH TWICE DAILY    . calcitRIOL (ROCALTROL) 0.25 MCG capsule Take 0.25 mcg by mouth 3 (three) times [name] week.    . Cinnamon 500 MG capsule Take 500 mg by mouth daily.     . cloNIDine (CATAPRES) 0.2 MG tablet Take 0.2 mg by mouth 2 (two) times daily.     . enalapril (VASOTEC) 20 MG tablet Take 20 mg by mouth 2 (two) times daily.     . ferrous sulfate 325 (65 FE) MG tablet Take 325 mg by mouth 2 (two) times daily with [name] meal.    . FLUoxetine (PROZAC) 20 MG capsule Take 20 mg by mouth 2 (two) times daily.     Marland Kitchen glucose blood (ONE TOUCH ULTRA TEST) test strip Use 1 each 2 (two) times daily. Use as instructed.    . glyBURIDE (DIABETA) 5 MG tablet Take 5 mg by mouth daily with breakfast.     . IBRANCE 125 MG capsule TAKE 1 CAPSULE BY MOUTH ONE TIME DAILY WITH BREAKFAST FOR 3 WEEKS ON FOLLOWED BY 1 WEEK OFF. TAKE WHOLE WITH FOOD. 21 capsule 6  . letrozole (FEMARA) 2.5 MG tablet TAKE 1 TABLET BY MOUTH DAILY 90 tablet 0  . LEVEMIR FLEXTOUCH 100 UNIT/ML Pen Inject 55 Units into the skin daily.     . mometasone (NASONEX) 50 MCG/ACT nasal spray Place 2 sprays into the nose daily as needed.     Marland Kitchen NOVOLOG FLEXPEN 100 UNIT/ML FlexPen Inject 7 Units into the skin 2 (two) times daily at 8 am and 10 pm.     . salmeterol (SEREVENT) 50 MCG/DOSE diskus inhaler Inhale 1 puff into the lungs 2 (two) times daily.    . simvastatin (ZOCOR) 20 MG tablet Take 20 mg by mouth daily at 6 PM.     . vitamin B-12 (CYANOCOBALAMIN) 1000 MCG tablet Take 1,000 mcg by mouth daily.     No current facility-administered medications for this visit.    Facility-Administered Medications Ordered in Other Visits  Medication Dose Route Frequency Provider Last Rate Last Dose  . sodium chloride flush (NS) 0.9 % injection 10 mL  10 mL Intravenous PRN Gloria Dalton [name], [name]   10  mL at 01/30/16 1054    PHYSICAL EXAMINATION: ECOG PERFORMANCE STATUS: 0 - Asymptomatic  BP (!) 164/91 (Patient Position: Sitting)   Pulse 64   Resp 16   Wt 193 lb (87.5 kg)   BMI  35.30 kg/m   Filed Weights   06/04/17 1027  Weight: 193 lb (87.5 kg)    GENERAL: Well-nourished well-developed; Alert, no distress and comfortable.  She is alone.  EYES: no pallor or icterus OROPHARYNX: no thrush or ulceration; good dentition  NECK: supple, no masses felt LYMPH:  no palpable lymphadenopathy in the cervical, axillary or inguinal regions LUNGS: clear to auscultation and  No wheeze or crackles HEART/CVS: regular rate & rhythm and no murmurs; No lower extremity edema ABDOMEN:abdomen soft, non-tender and normal bowel sounds Musculoskeletal:no cyanosis of digits and no clubbing  PSYCH: alert & oriented x 3 with fluent speech NEURO: no focal motor/sensory deficits SKIN:  No rash.     LABORATORY DATA:  I have reviewed the data as listed    Component Value Date/Time   NA 131 (L) 06/04/2017 1016   NA 130 (L) 06/06/2014 1102   K 4.8 06/04/2017 1016   K 3.9 06/06/2014 1102   CL 101 06/04/2017 1016   CL 95 (L) 06/06/2014 1102   CO2 21 (L) 06/04/2017 1016   CO2 28 06/06/2014 1102   GLUCOSE 216 (H) 06/04/2017 1016   GLUCOSE 349 (H) 06/06/2014 1102   BUN 50 (H) 06/04/2017 1016   BUN 17 06/06/2014 1102   CREATININE 3.58 (H) 06/04/2017 1016   CREATININE 1.63 (H) 06/06/2014 1102   CALCIUM 9.0 06/04/2017 1016   CALCIUM 9.2 06/06/2014 1102   PROT 7.9 06/04/2017 1016   PROT 8.2 06/06/2014 1102   ALBUMIN 4.2 06/04/2017 1016   ALBUMIN 3.3 (L) 06/06/2014 1102   AST 19 06/04/2017 1016   AST 7 (L) 06/06/2014 1102   ALT 15 06/04/2017 1016   ALT 12 (L) 06/06/2014 1102   ALKPHOS 61 06/04/2017 1016   ALKPHOS 74 06/06/2014 1102   BILITOT 0.6 06/04/2017 1016   BILITOT 0.4 06/06/2014 1102   GFRNONAA 13 (L) 06/04/2017 1016   GFRNONAA 35 (L) 06/06/2014 1102   GFRAA 15 (L) 06/04/2017 1016    GFRAA 42 (L) 06/06/2014 1102    No results found for: SPEP, UPEP  Lab Results  Component Value Date   WBC 3.9 06/04/2017   NEUTROABS 2.5 06/04/2017   HGB 10.1 (L) 06/04/2017   HCT 29.3 (L) 06/04/2017   MCV 104.3 (H) 06/04/2017   PLT 206 06/04/2017      Chemistry      Component Value Date/Time   NA 131 (L) 06/04/2017 1016   NA 130 (L) 06/06/2014 1102   K 4.8 06/04/2017 1016   K 3.9 06/06/2014 1102   CL 101 06/04/2017 1016   CL 95 (L) 06/06/2014 1102   CO2 21 (L) 06/04/2017 1016   CO2 28 06/06/2014 1102   BUN 50 (H) 06/04/2017 1016   BUN 17 06/06/2014 1102   CREATININE 3.58 (H) 06/04/2017 1016   CREATININE 1.63 (H) 06/06/2014 1102      Component Value Date/Time   CALCIUM 9.0 06/04/2017 1016   CALCIUM 9.2 06/06/2014 1102   ALKPHOS 61 06/04/2017 1016   ALKPHOS 74 06/06/2014 1102   AST 19 06/04/2017 1016   AST 7 (L) 06/06/2014 1102   ALT 15 06/04/2017 1016   ALT 12 (L) 06/06/2014 1102   BILITOT 0.6 06/04/2017 1016   BILITOT 0.4 06/06/2014 1102      Results for [name], [name] (MRN [id number]) as of 05/08/2017 08:29  Ref. Range 12/19/2016 13:05 02/02/2017 09:15 03/09/2017 10:55 04/09/2017 10:04 05/07/2017 09:34  CA 27.29 Latest Ref Range: 0.0 - 38.6 U/mL 70.7 (H) 74.9 (  H)     CA 27.29 Latest Ref Range: 0.0 - 38.6 U/mL   89.6 (H) 92.2 (H) 98.0 (H)   IMPRESSION: 1. 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 No new sites of metastatic disease. 2. Persistent mild hypermetabolism associated with soft tissue density in the supraumbilical right paramidline ventral abdominal wall. 3. 10 mm right lower lobe nodule, stable and decreased in size from 06/14/2014. 4.  Aortic atherosclerosis (ICD10-170.0).   Electronically Signed   By: Gloria Picket M.D.   On: 05/13/2017 11:48   ASSESSMENT & PLAN:  Carcinoma of upper-inner quadrant of left breast in female, estrogen receptor positive ([place]) # Left breast cancer stage IV/oligometastatic to the mediastinal lymph nodes  currently  with ibrance and Femara since March 2016. PET Oct 10th 2018-- no progression/improved left breast mass/ stable-treated bone lesions; except for Right acetabular STABLE uptake [asymptomatic; monitor for now]; Ca-27-29-RISING.  # Clinically  NO evidence of progression.  Patient will continue Femara with ibrance 125mg  3 w-On; 1 W-off [again start 9/05]. Labs today reviewed;  acceptable for continue.  # Anemia/ IDA-CKD- Hb 10-STABLE.  # CKD Stage IV- creat 3.5/ stable; No dialysis yet. appt with Dr.Kolluru in September 2018.    # Letter for OTC medications-   # follow up in 4 weeks/ labs.   # I reviewed the blood work- with the patient in detail; also reviewed the imaging independently [as summarized above]; and with the patient in detail.     Gloria Sickle, [name] 06/07/2017 12:01 PM

## 2017-06-04 NOTE — Assessment & Plan Note (Signed)
#   Left breast cancer stage IV/oligometastatic to the mediastinal lymph nodes currently  with ibrance and Femara since March 2016. PET Oct 10th 2018-- no progression/improved left breast mass/ stable-treated bone lesions; except for Right acetabular STABLE uptake [asymptomatic; monitor for now]; Ca-27-29-RISING.  # Clinically  NO evidence of progression.  Patient will continue Femara with ibrance 125mg  3 w-On; 1 W-off [again start 9/05]. Labs today reviewed;  acceptable for continue.  # Anemia/ IDA-CKD- Hb 10-STABLE.  # CKD Stage IV- creat 3.5/ stable; No dialysis yet. appt with Dr.Kolluru in September 2018.    # Letter for OTC medications-   # follow up in 4 weeks/ labs.   # I reviewed the blood work- with the patient in detail; also reviewed the imaging independently [as summarized above]; and with the patient in detail.

## 2017-06-05 DIAGNOSIS — C50812 Malignant neoplasm of overlapping sites of left female breast: Secondary | ICD-10-CM | POA: Diagnosis not present

## 2017-06-05 DIAGNOSIS — I10 Essential (primary) hypertension: Secondary | ICD-10-CM | POA: Diagnosis not present

## 2017-06-05 DIAGNOSIS — D649 Anemia, unspecified: Secondary | ICD-10-CM | POA: Diagnosis not present

## 2017-06-05 DIAGNOSIS — E1122 Type 2 diabetes mellitus with diabetic chronic kidney disease: Secondary | ICD-10-CM | POA: Diagnosis not present

## 2017-06-05 DIAGNOSIS — N184 Chronic kidney disease, stage 4 (severe): Secondary | ICD-10-CM | POA: Diagnosis not present

## 2017-06-05 DIAGNOSIS — I340 Nonrheumatic mitral (valve) insufficiency: Secondary | ICD-10-CM | POA: Diagnosis not present

## 2017-06-05 DIAGNOSIS — Z794 Long term (current) use of insulin: Secondary | ICD-10-CM | POA: Diagnosis not present

## 2017-06-05 LAB — CANCER ANTIGEN 27.29: CAN 27.29: 109.7 U/mL — AB (ref 0.0–38.6)

## 2017-06-24 ENCOUNTER — Other Ambulatory Visit: Payer: Self-pay | Admitting: Internal Medicine

## 2017-06-24 DIAGNOSIS — C50912 Malignant neoplasm of unspecified site of left female breast: Secondary | ICD-10-CM

## 2017-07-09 ENCOUNTER — Inpatient Hospital Stay (HOSPITAL_BASED_OUTPATIENT_CLINIC_OR_DEPARTMENT_OTHER): Payer: Medicare Other | Admitting: Internal Medicine

## 2017-07-09 ENCOUNTER — Inpatient Hospital Stay: Payer: Medicare Other | Attending: Internal Medicine

## 2017-07-09 VITALS — BP 164/93 | HR 68 | Resp 16 | Wt 193.8 lb

## 2017-07-09 DIAGNOSIS — D509 Iron deficiency anemia, unspecified: Secondary | ICD-10-CM | POA: Insufficient documentation

## 2017-07-09 DIAGNOSIS — E1122 Type 2 diabetes mellitus with diabetic chronic kidney disease: Secondary | ICD-10-CM | POA: Diagnosis not present

## 2017-07-09 DIAGNOSIS — D631 Anemia in chronic kidney disease: Secondary | ICD-10-CM | POA: Diagnosis not present

## 2017-07-09 DIAGNOSIS — Z87891 Personal history of nicotine dependence: Secondary | ICD-10-CM

## 2017-07-09 DIAGNOSIS — Z95828 Presence of other vascular implants and grafts: Secondary | ICD-10-CM

## 2017-07-09 DIAGNOSIS — I700 Atherosclerosis of aorta: Secondary | ICD-10-CM

## 2017-07-09 DIAGNOSIS — I509 Heart failure, unspecified: Secondary | ICD-10-CM | POA: Insufficient documentation

## 2017-07-09 DIAGNOSIS — C7951 Secondary malignant neoplasm of bone: Secondary | ICD-10-CM

## 2017-07-09 DIAGNOSIS — Z8041 Family history of malignant neoplasm of ovary: Secondary | ICD-10-CM

## 2017-07-09 DIAGNOSIS — Z170 Estrogen receptor positive status [ER+]: Secondary | ICD-10-CM | POA: Insufficient documentation

## 2017-07-09 DIAGNOSIS — C50212 Malignant neoplasm of upper-inner quadrant of left female breast: Secondary | ICD-10-CM | POA: Diagnosis not present

## 2017-07-09 DIAGNOSIS — N184 Chronic kidney disease, stage 4 (severe): Secondary | ICD-10-CM | POA: Insufficient documentation

## 2017-07-09 DIAGNOSIS — Z79811 Long term (current) use of aromatase inhibitors: Secondary | ICD-10-CM | POA: Diagnosis not present

## 2017-07-09 DIAGNOSIS — I130 Hypertensive heart and chronic kidney disease with heart failure and stage 1 through stage 4 chronic kidney disease, or unspecified chronic kidney disease: Secondary | ICD-10-CM | POA: Insufficient documentation

## 2017-07-09 DIAGNOSIS — Z8673 Personal history of transient ischemic attack (TIA), and cerebral infarction without residual deficits: Secondary | ICD-10-CM | POA: Diagnosis not present

## 2017-07-09 DIAGNOSIS — I252 Old myocardial infarction: Secondary | ICD-10-CM | POA: Diagnosis not present

## 2017-07-09 DIAGNOSIS — Z79899 Other long term (current) drug therapy: Secondary | ICD-10-CM | POA: Insufficient documentation

## 2017-07-09 LAB — CBC WITH DIFFERENTIAL/PLATELET
BASOS ABS: 0.2 10*3/uL — AB (ref 0–0.1)
Basophils Relative: 5 %
EOS PCT: 1 %
Eosinophils Absolute: 0 10*3/uL (ref 0–0.7)
HEMATOCRIT: 29.9 % — AB (ref 35.0–47.0)
HEMOGLOBIN: 10.3 g/dL — AB (ref 12.0–16.0)
LYMPHS ABS: 1 10*3/uL (ref 1.0–3.6)
LYMPHS PCT: 28 %
MCH: 35.9 pg — AB (ref 26.0–34.0)
MCHC: 34.5 g/dL (ref 32.0–36.0)
MCV: 104.1 fL — AB (ref 80.0–100.0)
Monocytes Absolute: 0.3 10*3/uL (ref 0.2–0.9)
Monocytes Relative: 9 %
NEUTROS ABS: 2.2 10*3/uL (ref 1.4–6.5)
Neutrophils Relative %: 57 %
PLATELETS: 207 10*3/uL (ref 150–440)
RBC: 2.87 MIL/uL — AB (ref 3.80–5.20)
RDW: 13.4 % (ref 11.5–14.5)
WBC: 3.7 10*3/uL (ref 3.6–11.0)

## 2017-07-09 LAB — COMPREHENSIVE METABOLIC PANEL
ALBUMIN: 3.9 g/dL (ref 3.5–5.0)
ALT: 13 U/L — ABNORMAL LOW (ref 14–54)
ANION GAP: 9 (ref 5–15)
AST: 17 U/L (ref 15–41)
Alkaline Phosphatase: 71 U/L (ref 38–126)
BILIRUBIN TOTAL: 0.8 mg/dL (ref 0.3–1.2)
BUN: 37 mg/dL — AB (ref 6–20)
CHLORIDE: 101 mmol/L (ref 101–111)
CO2: 23 mmol/L (ref 22–32)
Calcium: 9 mg/dL (ref 8.9–10.3)
Creatinine, Ser: 4.11 mg/dL — ABNORMAL HIGH (ref 0.44–1.00)
GFR calc Af Amer: 13 mL/min — ABNORMAL LOW (ref >60)
GFR, EST NON AFRICAN AMERICAN: 11 mL/min — AB (ref >60)
Glucose, Bld: 165 mg/dL — ABNORMAL HIGH (ref 65–99)
POTASSIUM: 4.5 mmol/L (ref 3.5–5.1)
Sodium: 133 mmol/L — ABNORMAL LOW (ref 135–145)
TOTAL PROTEIN: 7.8 g/dL (ref 6.5–8.1)

## 2017-07-09 MED ORDER — SODIUM CHLORIDE 0.9% FLUSH
10.0000 mL | INTRAVENOUS | Status: AC | PRN
  Administered 2017-07-09: 10 mL
  Filled 2017-07-09: qty 10

## 2017-07-09 MED ORDER — HEPARIN SOD (PORK) LOCK FLUSH 100 UNIT/ML IV SOLN
500.0000 [IU] | INTRAVENOUS | Status: AC | PRN
  Administered 2017-07-09: 500 [IU]

## 2017-07-09 NOTE — Assessment & Plan Note (Addendum)
#   Left breast cancer stage IV/oligometastatic to the mediastinal lymph nodes currently  with ibrance and Femara since March 2016. PET Oct 10th 2018-- no progression/improved left breast mass/ stable-treated bone lesions; except for Right acetabular STABLE uptake [asymptomatic; monitor for now]; Ca-27-29-RISING.  # Clinically  NO evidence of progression-I reviewed my concerns of increasing tumor marker; although PET scan October 2018-stable.  Patient will continue Femara with ibrance 125mg  3 w-On; 1 W-off [again started 12/03. Labs today reviewed;  acceptable for continue.   #Elevated blood pressure-recommend close monitoring at home; and inform us if systolic consistently above 140  # Anemia/ IDA-CKD- Hb 10-STABLE.  # acute Bornchitis- resolved.   # CKD Stage IV- creat 4.11/ slightly worse; but  No dialysis yet. appt with Dr.Kolluru on jan 3rd 2018.    # follow up in 4 weeks/ labs.

## 2017-07-09 NOTE — Progress Notes (Signed)
[place] OFFICE PROGRESS NOTE  [name] Care Team: [name], [name]   SUMMARY OF ONCOLOGIC HISTORY:  Oncology History   # OCT 2015-STAGE IV LEFT BREAST T2N1 [T=4cm; [name]; [name]; [name]; EBUS- Positive Paratrac/subcarinal LN s/p ? Taxotere [in [place]; [name]; [name];[name]; [name]; suv 3]; [name]; [name]; [name];  [name]; Ant R iliac bone; inferior sternum- not on X-geva  # Poorly controlled Blood sugars- improved.   # Pancreatitis Hx/ CKD IV [creat ~ 2-3]     Carcinoma of upper-inner quadrant of left breast in female, estrogen receptor positive ([place])   05/23/2016 Initial Diagnosis    Carcinoma of upper-inner quadrant of left breast in female, estrogen receptor positive ([place])        INTERVAL HISTORY:  A very pleasant 61 year old female [name] with above history of metastatic left breast cancer currently on second line therapy with ibrance plus Femara is here for follow-up.  In the interim [name] was prescribed Z-Pak by PCP for cough and left chest wall pain.  Currently status post antibiotics symptoms have resolved.  She currently denies any significant weight gain Or weight loss. No unusual bone pain.  No diarrhea. No swelling in legs. No fevers or chills. She denies any unusual shortness of breath or cough. Denies any headaches.  Denies any swelling in the legs.  REVIEW OF SYSTEMS:  A complete 10 point review of system is done which is negative except mentioned above/history of present illness.   PAST MEDICAL HISTORY :   Past Medical History:  Diagnosis Date  . Asthma   . CHF (congestive heart failure) ([place]) 1997  . CKD (chronic kidney disease)   . Depression   . Diabetes mellitus, type 2 ([place])   . Hair loss   . History of left breast cancer 10/26/Gloria  . History of partial hysterectomy 12/31/2016   Per [name].  Has not had a period in years.  Had a partial hysterectomy years ago.  . Obesity   . Pancreatitis 1997  . Stroke [facility]) 2010   with mild left arm weakness    PAST SURGICAL HISTORY :   Past Surgical History:  Procedure Laterality Date  . CESAREAN SECTION    . CHOLECYSTECTOMY    . PARTIAL HYSTERECTOMY  12/31/2016   Per [name], she has not had a period in years since she had a partial hysterectomy.    FAMILY HISTORY :   Family History  Problem Relation Age of Onset  . Ovarian cancer Mother   . Diabetes Mother   . Hypertension Mother   . COPD Father   . Hypertension Father   . Diabetes Sister   . Diabetes Brother     SOCIAL HISTORY:   Social History   Tobacco Use  . Smoking status: Former Smoker    Packs/day: 0.50    Years: 1.00    Pack years: 0.50    Types: Cigarettes  . Smokeless tobacco: Never Used  Substance Use Topics  . Alcohol use: No    Alcohol/week: 0.0 oz  . Drug use: No    ALLERGIES:  has No  Known Allergies.  MEDICATIONS:  Current Outpatient Medications  Medication Sig Dispense Refill  . acetaminophen-codeine (TYLENOL #4) 300-60 MG tablet Take 2 tablets by mouth every 6 (six) hours as needed for moderate pain.     Marland Kitchen albuterol (PROAIR HFA) 108 (90 BASE) MCG/ACT inhaler Inhale 1 puff into the lungs every 6 (six) hours as needed for shortness of breath.     Marland Kitchen albuterol (PROVENTIL) (2.5 MG/3ML) 0.083% nebulizer solution Inhale 2.5 mg into the lungs every 6 (six) hours as needed for shortness of breath.     . ALPRAZolam (XANAX) 0.5 MG tablet Take 0.5 mg by mouth at bedtime as needed for anxiety or sleep.     Marland Kitchen amLODipine (NORVASC) 10 MG tablet Take 10 mg by  mouth daily.     Marland Kitchen aspirin EC 81 MG tablet Take 81 mg by mouth once.     Marland Kitchen atenolol (TENORMIN) 50 MG tablet TAKE 1 1/2 TABLETS BY MOUTH TWICE DAILY    . B-D ULTRA-FINE 33 LANCETS MISC Use 1 each 2 (two) times daily.    . B-D ULTRAFINE III SHORT PEN 31G X 8 MM MISC     . bumetanide (BUMEX) 0.5 MG tablet TAKE 1 TABLET BY MOUTH TWICE DAILY    . calcitRIOL (ROCALTROL) 0.25 MCG capsule Take 0.25 mcg by mouth 3 (three) times a week.    . Cinnamon 500 MG capsule Take 500 mg by mouth daily.     . cloNIDine (CATAPRES) 0.2 MG tablet Take 0.2 mg by mouth 2 (two) times daily.     . enalapril (VASOTEC) 20 MG tablet Take 20 mg by mouth 2 (two) times daily.     . ferrous sulfate 325 (65 FE) MG tablet Take 325 mg by mouth 2 (two) times daily with a meal.    . FLUoxetine (PROZAC) 20 MG capsule Take 20 mg by mouth 2 (two) times daily.     Marland Kitchen glucose blood (ONE TOUCH ULTRA TEST) test strip Use 1 each 2 (two) times daily. Use as instructed.    . glyBURIDE (DIABETA) 5 MG tablet Take 5 mg by mouth daily with breakfast.     . IBRANCE 125 MG capsule TAKE 1 CAPSULE BY MOUTH ONE TIME DAILY WITH BREAKFAST FOR 3 WEEKS ON FOLLOWED BY 1 WEEK OFF. TAKE WHOLE WITH FOOD. 21 capsule 6  . letrozole (FEMARA) 2.5 MG tablet TAKE 1 TABLET BY MOUTH DAILY 90 tablet 0  . LEVEMIR FLEXTOUCH 100 UNIT/ML Pen Inject 55 Units into the skin daily.     . mometasone (NASONEX) 50 MCG/ACT nasal spray Place 2 sprays into the nose daily as needed.     Marland Kitchen NOVOLOG FLEXPEN 100 UNIT/ML FlexPen Inject 7 Units into the skin 2 (two) times daily at 8 am and 10 pm.     . salmeterol (SEREVENT) 50 MCG/DOSE diskus inhaler Inhale 1 puff into the lungs 2 (two) times daily.    . simvastatin (ZOCOR) 20 MG tablet Take 20 mg by mouth daily at 6 PM.     . vitamin B-12 (CYANOCOBALAMIN) 1000 MCG tablet Take 1,000 mcg by mouth daily.     No current facility-administered medications for this visit.    Facility-Administered Medications Ordered in Other Visits   Medication Dose Route Frequency Provider Last Rate Last Dose  . sodium chloride flush (NS) 0.9 % injection 10 mL  10 mL Intravenous PRN Cammie Sickle, MD   10 mL at 01/30/16 1054    PHYSICAL EXAMINATION: ECOG PERFORMANCE STATUS: 0 -  Asymptomatic  BP (!) 164/93 (BP Location: Left Arm, [name] Position: Sitting)   Pulse 68   Resp 16   Wt 193 lb 12.8 oz (87.9 kg)   BMI 35.45 kg/m   Filed Weights   07/09/17 1126 07/09/17 1130  Weight: 193 lb 12.8 oz (87.9 kg) 193 lb 12.8 oz (87.9 kg)    GENERAL: Well-nourished well-developed; Alert, no distress and comfortable.  She is alone.  EYES: no pallor or icterus OROPHARYNX: no thrush or ulceration; good dentition  NECK: supple, no masses felt LYMPH:  no palpable lymphadenopathy in the cervical, axillary or inguinal regions LUNGS: clear to auscultation and  No wheeze or crackles HEART/CVS: regular rate & rhythm and no murmurs; No lower extremity edema ABDOMEN:abdomen soft, non-tender and normal bowel sounds Musculoskeletal:no cyanosis of digits and no clubbing  PSYCH: alert & oriented x 3 with fluent speech NEURO: no focal motor/sensory deficits SKIN:  No rash.     LABORATORY DATA:  I have reviewed the data as listed    Component Value Date/Time   NA 133 (L) 07/09/2017 1029   NA 130 (L) 06/06/2014 1102   K 4.5 07/09/2017 1029   K 3.9 06/06/2014 1102   CL 101 07/09/2017 1029   CL 95 (L) 06/06/2014 1102   CO2 23 07/09/2017 1029   CO2 28 06/06/2014 1102   GLUCOSE 165 (H) 07/09/2017 1029   GLUCOSE 349 (H) 06/06/2014 1102   BUN 37 (H) 07/09/2017 1029   BUN 17 06/06/2014 1102   CREATININE 4.11 (H) 07/09/2017 1029   CREATININE 1.63 (H) 06/06/2014 1102   CALCIUM 9.0 07/09/2017 1029   CALCIUM 9.2 06/06/2014 1102   PROT 7.8 07/09/2017 1029   PROT 8.2 06/06/2014 1102   ALBUMIN 3.9 07/09/2017 1029   ALBUMIN 3.3 (L) 06/06/2014 1102   AST 17 07/09/2017 1029   AST 7 (L) 06/06/2014 1102   ALT 13 (L) 07/09/2017 1029   ALT 12  (L) 06/06/2014 1102   ALKPHOS 71 07/09/2017 1029   ALKPHOS 74 06/06/2014 1102   BILITOT 0.8 07/09/2017 1029   BILITOT 0.4 06/06/2014 1102   GFRNONAA 11 (L) 07/09/2017 1029   GFRNONAA 35 (L) 06/06/2014 1102   GFRAA 13 (L) 07/09/2017 1029   GFRAA 42 (L) 06/06/2014 1102    No results found for: SPEP, UPEP  Lab Results  Component Value Date   WBC 3.7 07/09/2017   NEUTROABS 2.2 07/09/2017   HGB 10.3 (L) 07/09/2017   HCT 29.9 (L) 07/09/2017   MCV 104.1 (H) 07/09/2017   PLT 207 07/09/2017      Chemistry      Component Value Date/Time   NA 133 (L) 07/09/2017 1029   NA 130 (L) 06/06/2014 1102   K 4.5 07/09/2017 1029   K 3.9 06/06/2014 1102   CL 101 07/09/2017 1029   CL 95 (L) 06/06/2014 1102   CO2 23 07/09/2017 1029   CO2 28 06/06/2014 1102   BUN 37 (H) 07/09/2017 1029   BUN 17 06/06/2014 1102   CREATININE 4.11 (H) 07/09/2017 1029   CREATININE 1.63 (H) 06/06/2014 1102      Component Value Date/Time   CALCIUM 9.0 07/09/2017 1029   CALCIUM 9.2 06/06/2014 1102   ALKPHOS 71 07/09/2017 1029   ALKPHOS 74 06/06/2014 1102   AST 17 07/09/2017 1029   AST 7 (L) 06/06/2014 1102   ALT 13 (L) 07/09/2017 1029   ALT 12 (L) 06/06/2014 1102   BILITOT 0.8 07/09/2017 1029   BILITOT 0.4 06/06/2014 1102  Results for ADALEA, HANDLER (MRN [id number]) as of 05/08/2017 08:29  Ref. Range 12/19/2016 13:05 02/02/2017 09:Gloria 03/09/2017 10:55 04/09/2017 10:04 05/07/2017 09:34  CA 27.29 Latest Ref Range: 0.0 - 38.6 U/mL 70.7 (H) 74.9 (H)     CA 27.29 Latest Ref Range: 0.0 - 38.6 U/mL   89.6 (H) 92.2 (H) 98.0 (H)   IMPRESSION: 1. 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 No new sites of metastatic disease. 2. Persistent mild hypermetabolism associated with soft tissue density in the supraumbilical right paramidline ventral abdominal wall. 3. 10 mm right lower lobe nodule, stable and decreased in size from 06/14/2014. 4.  Aortic atherosclerosis (ICD10-170.0).   Electronically Signed   By:  Lorin Picket M.D.   On: 05/13/2017 11:48   ASSESSMENT & PLAN:  Carcinoma of upper-inner quadrant of left breast in female, estrogen receptor positive ([place]) # Left breast cancer stage IV/oligometastatic to the mediastinal lymph nodes currently  with ibrance and Femara since March 2016. PET Oct 10th 2018-- no progression/improved left breast mass/ stable-treated bone lesions; except for Right acetabular STABLE uptake [asymptomatic; monitor for now]; Ca-27-29-RISING.  # Clinically  NO evidence of progression-I reviewed my concerns of increasing tumor marker; although PET scan October 2018-stable.  [name] will continue Femara with ibrance 125mg  3 w-On; 1 W-off [again started 12/03. Labs today reviewed;  acceptable for continue.   #Elevated blood pressure-recommend close monitoring at home; and inform us if systolic consistently above 140  # Anemia/ IDA-CKD- Hb 10-STABLE.  # acute Bornchitis- resolved.   # CKD Stage IV- creat 4.11/ slightly worse; but  No dialysis yet. appt with Dr.Kolluru on jan 3rd 2018.    # follow up in 4 weeks/ labs.     Cammie Sickle, MD 07/09/2017 1:25 PM

## 2017-07-10 LAB — CANCER ANTIGEN 27.29: CAN 27.29: 114 U/mL — AB (ref 0.0–38.6)

## 2017-08-07 ENCOUNTER — Other Ambulatory Visit: Payer: Self-pay

## 2017-08-07 ENCOUNTER — Encounter: Payer: Self-pay | Admitting: Internal Medicine

## 2017-08-07 ENCOUNTER — Inpatient Hospital Stay: Payer: Medicare Other | Attending: Internal Medicine | Admitting: *Deleted

## 2017-08-07 ENCOUNTER — Inpatient Hospital Stay (HOSPITAL_BASED_OUTPATIENT_CLINIC_OR_DEPARTMENT_OTHER): Payer: Medicare Other | Admitting: Internal Medicine

## 2017-08-07 VITALS — BP 146/88 | HR 72 | Temp 97.9°F | Resp 18 | Ht 62.0 in | Wt 194.0 lb

## 2017-08-07 DIAGNOSIS — Z90710 Acquired absence of both cervix and uterus: Secondary | ICD-10-CM | POA: Insufficient documentation

## 2017-08-07 DIAGNOSIS — D509 Iron deficiency anemia, unspecified: Secondary | ICD-10-CM | POA: Insufficient documentation

## 2017-08-07 DIAGNOSIS — Z170 Estrogen receptor positive status [ER+]: Secondary | ICD-10-CM | POA: Diagnosis not present

## 2017-08-07 DIAGNOSIS — Z7982 Long term (current) use of aspirin: Secondary | ICD-10-CM | POA: Diagnosis not present

## 2017-08-07 DIAGNOSIS — Z794 Long term (current) use of insulin: Secondary | ICD-10-CM | POA: Insufficient documentation

## 2017-08-07 DIAGNOSIS — E1122 Type 2 diabetes mellitus with diabetic chronic kidney disease: Secondary | ICD-10-CM

## 2017-08-07 DIAGNOSIS — C7951 Secondary malignant neoplasm of bone: Secondary | ICD-10-CM

## 2017-08-07 DIAGNOSIS — Z79899 Other long term (current) drug therapy: Secondary | ICD-10-CM | POA: Diagnosis not present

## 2017-08-07 DIAGNOSIS — Z79811 Long term (current) use of aromatase inhibitors: Secondary | ICD-10-CM | POA: Diagnosis not present

## 2017-08-07 DIAGNOSIS — I700 Atherosclerosis of aorta: Secondary | ICD-10-CM | POA: Diagnosis not present

## 2017-08-07 DIAGNOSIS — Z8673 Personal history of transient ischemic attack (TIA), and cerebral infarction without residual deficits: Secondary | ICD-10-CM

## 2017-08-07 DIAGNOSIS — C50212 Malignant neoplasm of upper-inner quadrant of left female breast: Secondary | ICD-10-CM | POA: Diagnosis not present

## 2017-08-07 DIAGNOSIS — Z95828 Presence of other vascular implants and grafts: Secondary | ICD-10-CM

## 2017-08-07 DIAGNOSIS — N184 Chronic kidney disease, stage 4 (severe): Secondary | ICD-10-CM | POA: Diagnosis not present

## 2017-08-07 DIAGNOSIS — I509 Heart failure, unspecified: Secondary | ICD-10-CM | POA: Insufficient documentation

## 2017-08-07 DIAGNOSIS — I130 Hypertensive heart and chronic kidney disease with heart failure and stage 1 through stage 4 chronic kidney disease, or unspecified chronic kidney disease: Secondary | ICD-10-CM | POA: Insufficient documentation

## 2017-08-07 DIAGNOSIS — D631 Anemia in chronic kidney disease: Secondary | ICD-10-CM | POA: Diagnosis not present

## 2017-08-07 LAB — COMPREHENSIVE METABOLIC PANEL
ALBUMIN: 4 g/dL (ref 3.5–5.0)
ALK PHOS: 67 U/L (ref 38–126)
ALT: 11 U/L — ABNORMAL LOW (ref 14–54)
ANION GAP: 12 (ref 5–15)
AST: 17 U/L (ref 15–41)
BUN: 43 mg/dL — ABNORMAL HIGH (ref 6–20)
CALCIUM: 9 mg/dL (ref 8.9–10.3)
CHLORIDE: 99 mmol/L — AB (ref 101–111)
CO2: 23 mmol/L (ref 22–32)
Creatinine, Ser: 4.34 mg/dL — ABNORMAL HIGH (ref 0.44–1.00)
GFR calc non Af Amer: 10 mL/min — ABNORMAL LOW (ref >60)
GFR, EST AFRICAN AMERICAN: 12 mL/min — AB (ref >60)
GLUCOSE: 114 mg/dL — AB (ref 65–99)
Potassium: 4.3 mmol/L (ref 3.5–5.1)
SODIUM: 134 mmol/L — AB (ref 135–145)
Total Bilirubin: 0.8 mg/dL (ref 0.3–1.2)
Total Protein: 7.8 g/dL (ref 6.5–8.1)

## 2017-08-07 LAB — CBC WITH DIFFERENTIAL/PLATELET
BASOS PCT: 1 %
Basophils Absolute: 0 10*3/uL (ref 0–0.1)
Eosinophils Absolute: 0 10*3/uL (ref 0–0.7)
Eosinophils Relative: 1 %
HEMATOCRIT: 28.5 % — AB (ref 35.0–47.0)
Hemoglobin: 9.8 g/dL — ABNORMAL LOW (ref 12.0–16.0)
LYMPHS ABS: 1 10*3/uL (ref 1.0–3.6)
Lymphocytes Relative: 24 %
MCH: 36.1 pg — AB (ref 26.0–34.0)
MCHC: 34.3 g/dL (ref 32.0–36.0)
MCV: 105.2 fL — AB (ref 80.0–100.0)
MONO ABS: 0.6 10*3/uL (ref 0.2–0.9)
MONOS PCT: 14 %
NEUTROS ABS: 2.6 10*3/uL (ref 1.4–6.5)
NEUTROS PCT: 60 %
Platelets: 153 10*3/uL (ref 150–440)
RBC: 2.71 MIL/uL — ABNORMAL LOW (ref 3.80–5.20)
RDW: 13.3 % (ref 11.5–14.5)
WBC: 4.2 10*3/uL (ref 3.6–11.0)

## 2017-08-07 MED ORDER — HEPARIN SOD (PORK) LOCK FLUSH 100 UNIT/ML IV SOLN
500.0000 [IU] | INTRAVENOUS | Status: AC | PRN
  Administered 2017-08-07: 500 [IU]

## 2017-08-07 MED ORDER — METOPROLOL TARTRATE 25 MG PO TABS: 25.0000 mg | ORAL_TABLET | Freq: Two times a day (BID) | ORAL | 3 refills | Status: DC

## 2017-08-07 MED ORDER — SODIUM CHLORIDE 0.9% FLUSH
10.0000 mL | INTRAVENOUS | Status: AC | PRN
  Administered 2017-08-07: 10 mL
  Filled 2017-08-07: qty 10

## 2017-08-07 NOTE — Progress Notes (Signed)
[name] OFFICE PROGRESS NOTE  Patient Care Team: [name], [name]   SUMMARY OF ONCOLOGIC HISTORY:  Oncology History   # OCT 2015-STAGE IV LEFT BREAST T2N1 [T=4cm; [name]; [name]; [name]; EBUS- Positive Paratrac/subcarinal LN s/p ? Taxotere [in [place]; [name]; [name];[name]; [name]; suv 3]; [name]; [name]; [name];  [name]; Ant R iliac bone; inferior sternum- not on X-geva  # Poorly controlled Blood sugars- improved.   # Pancreatitis Hx/ CKD IV [creat ~ 2-3]     Carcinoma of upper-inner quadrant of left breast in female, estrogen receptor positive ([place])   05/23/2016 Initial Diagnosis    Carcinoma of upper-inner quadrant of left breast in female, estrogen receptor positive ([place])       INTERVAL HISTORY:  A very pleasant 62 year old female patient with above history of metastatic left breast cancer currently on second line therapy with ibrance plus Femara is here for follow-up.  She currently denies any significant weight gain Or weight loss. No unusual bone pain.  No diarrhea. No swelling in legs. No fevers or chills. She denies any unusual shortness of breath or cough. Denies any headaches.  Denies any swelling in the legs.  She has had no symptoms of bronchitis or pneumonia.  REVIEW OF SYSTEMS:  A complete 10 point review of system is done which is negative except mentioned above/history of present illness.   PAST MEDICAL HISTORY :  Past Medical History:  Diagnosis Date  . Asthma   . CHF (congestive heart failure) ([place]) 1997   . CKD (chronic kidney disease)   . Depression   . Diabetes mellitus, type 2 ([place])   . Hair loss   . History of left breast cancer 05/29/14  . History of partial hysterectomy 12/31/2016   Per patient.  Has not had a period in years.  Had a partial hysterectomy years ago.  . Obesity   . Pancreatitis 1997  . Stroke [facility]) 2010   with mild left arm weakness    PAST SURGICAL HISTORY :   Past Surgical History:  Procedure Laterality Date  . CESAREAN SECTION    . CHOLECYSTECTOMY    . PARTIAL HYSTERECTOMY  12/31/2016   Per patient, she has not had a period in years since she had a partial hysterectomy.    FAMILY HISTORY :   Family History  Problem Relation Age of Onset  . Ovarian cancer Mother   . Diabetes Mother   . Hypertension Mother   . COPD Father   . Hypertension Father   . Diabetes Sister   . Diabetes Brother     SOCIAL HISTORY:   Social History   Tobacco Use  . Smoking status: Former Smoker    Packs/day: 0.50    Years: 1.00    Pack years: 0.50    Types: Cigarettes  . Smokeless tobacco: Never Used  Substance Use Topics  . Alcohol use: No    Alcohol/week: 0.0 oz  . Drug use: No    ALLERGIES:  has No Known Allergies.  MEDICATIONS:  Current Outpatient Medications  Medication Sig Dispense Refill  . [employer]  125 MG capsule TAKE 1 CAPSULE BY MOUTH ONE TIME DAILY WITH BREAKFAST FOR 3 WEEKS ON FOLLOWED BY 1 WEEK OFF. TAKE WHOLE WITH FOOD. 21 capsule 6  . letrozole (FEMARA) 2.5 MG tablet TAKE 1 TABLET BY MOUTH DAILY 90 tablet 0  . acetaminophen-codeine (TYLENOL #4) 300-60 MG tablet Take 2 tablets by mouth every 6 (six) hours as needed for moderate pain.     Marland Kitchen albuterol (PROAIR HFA) 108 (90 BASE) MCG/ACT inhaler Inhale 1 puff into the lungs every 6 (six) hours as needed for shortness of breath.     Marland Kitchen albuterol (PROVENTIL) (2.5 MG/3ML) 0.083% nebulizer solution Inhale 2.5 mg into the lungs every 6 (six) hours as needed for shortness of breath.     . ALPRAZolam (XANAX)  0.5 MG tablet Take 0.5 mg by mouth at bedtime as needed for anxiety or sleep.     Marland Kitchen amLODipine (NORVASC) 10 MG tablet Take 10 mg by mouth daily.     Marland Kitchen aspirin EC 81 MG tablet Take 81 mg by mouth once.     Marland Kitchen atenolol (TENORMIN) 50 MG tablet TAKE 1 1/2 TABLETS BY MOUTH TWICE DAILY    . B-D ULTRA-FINE 33 LANCETS MISC Use 1 each 2 (two) times daily.    . B-D ULTRAFINE III SHORT PEN 31G X 8 MM MISC     . bumetanide (BUMEX) 0.5 MG tablet TAKE 1 TABLET BY MOUTH TWICE DAILY    . calcitRIOL (ROCALTROL) 0.25 MCG capsule Take 0.25 mcg by mouth 3 (three) times a week.    . Cinnamon 500 MG capsule Take 500 mg by mouth daily.     . cloNIDine (CATAPRES) 0.2 MG tablet Take 0.2 mg by mouth 2 (two) times daily.     . enalapril (VASOTEC) 20 MG tablet Take 20 mg by mouth 2 (two) times daily.     . ferrous sulfate 325 (65 FE) MG tablet Take 325 mg by mouth 2 (two) times daily with a meal.    . FLUoxetine (PROZAC) 20 MG capsule Take 20 mg by mouth 2 (two) times daily.     Marland Kitchen glucose blood (ONE TOUCH ULTRA TEST) test strip Use 1 each 2 (two) times daily. Use as instructed.    . glyBURIDE (DIABETA) 5 MG tablet Take 5 mg by mouth daily with breakfast.     . LEVEMIR FLEXTOUCH 100 UNIT/ML Pen Inject 55 Units into the skin daily.     . metoprolol tartrate (LOPRESSOR) 25 MG tablet Take 1 tablet (25 mg total) by mouth 2 (two) times daily. 60 tablet 3  . mometasone (NASONEX) 50 MCG/ACT nasal spray Place 2 sprays into the nose daily as needed.     Marland Kitchen NOVOLOG FLEXPEN 100 UNIT/ML FlexPen Inject 7 Units into the skin 2 (two) times daily at 8 am and 10 pm.     . salmeterol (SEREVENT) 50 MCG/DOSE diskus inhaler Inhale 1 puff into the lungs 2 (two) times daily.    . simvastatin (ZOCOR) 20 MG tablet Take 20 mg by mouth daily at 6 PM.     . vitamin B-12 (CYANOCOBALAMIN) 1000 MCG tablet Take 1,000 mcg by mouth daily.     No current facility-administered medications for this visit.    Facility-Administered Medications Ordered in Other  Visits  Medication Dose Route Frequency Provider Last Rate Last Dose  . sodium chloride flush (NS) 0.9 % injection 10 mL  10 mL Intravenous PRN Cammie Sickle, MD   10 mL at 01/30/16 1054  PHYSICAL EXAMINATION: ECOG PERFORMANCE STATUS: 0 - Asymptomatic  BP (!) 146/88 (BP Location: Right Arm, Patient Position: Sitting)   Pulse 72   Temp 97.9 F (36.6 C) (Tympanic)   Resp 18   Ht 5\' 2"  (1.575 m)   Wt 194 lb (88 kg)   BMI 35.48 kg/m   Filed Weights   08/07/17 1049  Weight: 194 lb (88 kg)    GENERAL: Well-nourished well-developed; Alert, no distress and comfortable.  She is alone.  EYES: no pallor or icterus OROPHARYNX: no thrush or ulceration; good dentition  NECK: supple, no masses felt LYMPH:  no palpable lymphadenopathy in the cervical, axillary or inguinal regions LUNGS: clear to auscultation and  No wheeze or crackles HEART/CVS: regular rate & rhythm and no murmurs; No lower extremity edema ABDOMEN:abdomen soft, non-tender and normal bowel sounds Musculoskeletal:no cyanosis of digits and no clubbing  PSYCH: alert & oriented x 3 with fluent speech NEURO: no focal motor/sensory deficits SKIN:  No rash.     LABORATORY DATA:  I have reviewed the data as listed    Component Value Date/Time   NA 134 (L) 08/07/2017 0949   NA 130 (L) 06/06/2014 1102   K 4.3 08/07/2017 0949   K 3.9 06/06/2014 1102   CL 99 (L) 08/07/2017 0949   CL 95 (L) 06/06/2014 1102   CO2 23 08/07/2017 0949   CO2 28 06/06/2014 1102   GLUCOSE 114 (H) 08/07/2017 0949   GLUCOSE 349 (H) 06/06/2014 1102   BUN 43 (H) 08/07/2017 0949   BUN 17 06/06/2014 1102   CREATININE 4.34 (H) 08/07/2017 0949   CREATININE 1.63 (H) 06/06/2014 1102   CALCIUM 9.0 08/07/2017 0949   CALCIUM 9.2 06/06/2014 1102   PROT 7.8 08/07/2017 0949   PROT 8.2 06/06/2014 1102   ALBUMIN 4.0 08/07/2017 0949   ALBUMIN 3.3 (L) 06/06/2014 1102   AST 17 08/07/2017 0949   AST 7 (L) 06/06/2014 1102   ALT 11 (L) 08/07/2017 0949    ALT 12 (L) 06/06/2014 1102   ALKPHOS 67 08/07/2017 0949   ALKPHOS 74 06/06/2014 1102   BILITOT 0.8 08/07/2017 0949   BILITOT 0.4 06/06/2014 1102   GFRNONAA 10 (L) 08/07/2017 0949   GFRNONAA 35 (L) 06/06/2014 1102   GFRAA 12 (L) 08/07/2017 0949   GFRAA 42 (L) 06/06/2014 1102    No results found for: SPEP, UPEP  Lab Results  Component Value Date   WBC 4.2 08/07/2017   NEUTROABS 2.6 08/07/2017   HGB 9.8 (L) 08/07/2017   HCT 28.5 (L) 08/07/2017   MCV 105.2 (H) 08/07/2017   PLT 153 08/07/2017      Chemistry      Component Value Date/Time   NA 134 (L) 08/07/2017 0949   NA 130 (L) 06/06/2014 1102   K 4.3 08/07/2017 0949   K 3.9 06/06/2014 1102   CL 99 (L) 08/07/2017 0949   CL 95 (L) 06/06/2014 1102   CO2 23 08/07/2017 0949   CO2 28 06/06/2014 1102   BUN 43 (H) 08/07/2017 0949   BUN 17 06/06/2014 1102   CREATININE 4.34 (H) 08/07/2017 0949   CREATININE 1.63 (H) 06/06/2014 1102      Component Value Date/Time   CALCIUM 9.0 08/07/2017 0949   CALCIUM 9.2 06/06/2014 1102   ALKPHOS 67 08/07/2017 0949   ALKPHOS 74 06/06/2014 1102   AST 17 08/07/2017 0949   AST 7 (L) 06/06/2014 1102   ALT 11 (L) 08/07/2017 0949   ALT 12 (L) 06/06/2014 1102  BILITOT 0.8 08/07/2017 0949   BILITOT 0.4 06/06/2014 1102      Results for [name], [name] (MRN [id number]) as of 05/08/2017 08:29  Ref. Range 12/19/2016 13:05 02/02/2017 09:15 03/09/2017 10:55 04/09/2017 10:04 05/07/2017 09:34  CA 27.29 Latest Ref Range: 0.0 - 38.6 U/mL 70.7 (H) 74.9 (H)     CA 27.29 Latest Ref Range: 0.0 - 38.6 U/mL   89.6 (H) 92.2 (H) 98.0 (H)   IMPRESSION: 1. 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 No new sites of metastatic disease. 2. Persistent mild hypermetabolism associated with soft tissue density in the supraumbilical right paramidline ventral abdominal wall. 3. 10 mm right lower lobe nodule, stable and decreased in size from 06/14/2014. 4.  Aortic atherosclerosis (ICD10-170.0).   Electronically  Signed   By: Lorin Picket M.D.   On: 05/13/2017 11:48   ASSESSMENT & PLAN:  Carcinoma of upper-inner quadrant of left breast in female, estrogen receptor positive ([place]) # Left breast cancer stage IV/oligometastatic to the mediastinal lymph nodes currently  with ibrance and Femara since March 2016. PET Oct 10th 2018-- no progression/improved left breast mass/ stable-treated bone lesions; except for Right acetabular STABLE uptake [asymptomatic; monitor for now]; Ca-27-29-RISING ~119 indec 2018.  # Clinically  NO evidence of progression-I reviewed my concerns of increasing tumor marker; although PET scan October 2018-stable.  However if there is progression of disease on repeat imaging -the next option for the patient would be chemotherapy-which might put patient to dialysis; which patient wants to hold off for now  # Patient will continue Femara with ibrance 125mg  3 w-On; 1 W-off [again started 12/03. Labs today reviewed;  acceptable for continue.   #Elevated blood pressure-recommend close monitoring at home; and alos add metoprolol 25 BID. Script sent to pharmacy.   # Anemia/ IDA-CKD- Hb 9-10-STABLE; on PO iron.   # CKD Stage IV- creat 4.17/ slightly worse; but  No dialysis yet. appt with Dr.Kolluru on jan 17th.    # follow up in 4 weeks/ labs; ca 27-29.     Cammie Sickle, MD 08/07/2017 12:34 PM

## 2017-08-07 NOTE — Assessment & Plan Note (Signed)
#   Left breast cancer stage IV/oligometastatic to the mediastinal lymph nodes currently  with ibrance and Femara since March 2016. PET Oct 10th 2018-- no progression/improved left breast mass/ stable-treated bone lesions; except for Right acetabular STABLE uptake [asymptomatic; monitor for now]; Ca-27-29-RISING ~119 indec 2018.  # Clinically  NO evidence of progression-I reviewed my concerns of increasing tumor marker; although PET scan October 2018-stable.  However if there is progression of disease on repeat imaging -the next option for the patient would be chemotherapy-which might put patient to dialysis; which patient wants to hold off for now  # Patient will continue Femara with ibrance 125mg  3 w-On; 1 W-off [again started 12/03. Labs today reviewed;  acceptable for continue.   #Elevated blood pressure-recommend close monitoring at home; and alos add metoprolol 25 BID. Script sent to pharmacy.   # Anemia/ IDA-CKD- Hb 9-10-STABLE; on PO iron.   # CKD Stage IV- creat 4.17/ slightly worse; but  No dialysis yet. appt with Dr.Kolluru on jan 17th.    # follow up in 4 weeks/ labs; ca 27-29.

## 2017-08-07 NOTE — Progress Notes (Signed)
Patient started back on Ibrance on 08/05/17. Has not missed any dosing. Currently taking her Femara as directed. Patient reports that her pcp gave her a zpac for bronchitis about a month ago. Patient reports 'a dull tenderness' at left lower rib cage since her dx of bronchitis. Pt reports improvement in URI symptoms. She reports an occasional cough. She has not had any chest xrays.

## 2017-08-08 LAB — CANCER ANTIGEN 27.29: CA 27.29: 105.9 U/mL — ABNORMAL HIGH (ref 0.0–38.6)

## 2017-08-20 DIAGNOSIS — N185 Chronic kidney disease, stage 5: Secondary | ICD-10-CM | POA: Diagnosis not present

## 2017-08-20 DIAGNOSIS — D631 Anemia in chronic kidney disease: Secondary | ICD-10-CM | POA: Diagnosis not present

## 2017-08-20 DIAGNOSIS — I120 Hypertensive chronic kidney disease with stage 5 chronic kidney disease or end stage renal disease: Secondary | ICD-10-CM | POA: Diagnosis not present

## 2017-08-20 DIAGNOSIS — E871 Hypo-osmolality and hyponatremia: Secondary | ICD-10-CM | POA: Diagnosis not present

## 2017-08-20 DIAGNOSIS — N2581 Secondary hyperparathyroidism of renal origin: Secondary | ICD-10-CM | POA: Diagnosis not present

## 2017-09-04 ENCOUNTER — Inpatient Hospital Stay (HOSPITAL_BASED_OUTPATIENT_CLINIC_OR_DEPARTMENT_OTHER): Payer: Medicare Other | Admitting: Internal Medicine

## 2017-09-04 ENCOUNTER — Inpatient Hospital Stay: Payer: Medicare Other | Attending: Internal Medicine

## 2017-09-04 VITALS — BP 160/93 | HR 71 | Temp 97.7°F | Wt 193.6 lb

## 2017-09-04 DIAGNOSIS — Z79899 Other long term (current) drug therapy: Secondary | ICD-10-CM | POA: Insufficient documentation

## 2017-09-04 DIAGNOSIS — Z90710 Acquired absence of both cervix and uterus: Secondary | ICD-10-CM | POA: Insufficient documentation

## 2017-09-04 DIAGNOSIS — Z8673 Personal history of transient ischemic attack (TIA), and cerebral infarction without residual deficits: Secondary | ICD-10-CM | POA: Diagnosis not present

## 2017-09-04 DIAGNOSIS — Z8041 Family history of malignant neoplasm of ovary: Secondary | ICD-10-CM | POA: Diagnosis not present

## 2017-09-04 DIAGNOSIS — Z7951 Long term (current) use of inhaled steroids: Secondary | ICD-10-CM

## 2017-09-04 DIAGNOSIS — E1122 Type 2 diabetes mellitus with diabetic chronic kidney disease: Secondary | ICD-10-CM | POA: Insufficient documentation

## 2017-09-04 DIAGNOSIS — Z87891 Personal history of nicotine dependence: Secondary | ICD-10-CM | POA: Diagnosis not present

## 2017-09-04 DIAGNOSIS — Z170 Estrogen receptor positive status [ER+]: Secondary | ICD-10-CM

## 2017-09-04 DIAGNOSIS — D509 Iron deficiency anemia, unspecified: Secondary | ICD-10-CM | POA: Insufficient documentation

## 2017-09-04 DIAGNOSIS — C50212 Malignant neoplasm of upper-inner quadrant of left female breast: Secondary | ICD-10-CM | POA: Insufficient documentation

## 2017-09-04 DIAGNOSIS — N184 Chronic kidney disease, stage 4 (severe): Secondary | ICD-10-CM | POA: Insufficient documentation

## 2017-09-04 DIAGNOSIS — I700 Atherosclerosis of aorta: Secondary | ICD-10-CM | POA: Diagnosis not present

## 2017-09-04 DIAGNOSIS — Z794 Long term (current) use of insulin: Secondary | ICD-10-CM | POA: Diagnosis not present

## 2017-09-04 DIAGNOSIS — Z7982 Long term (current) use of aspirin: Secondary | ICD-10-CM

## 2017-09-04 DIAGNOSIS — Z79811 Long term (current) use of aromatase inhibitors: Secondary | ICD-10-CM

## 2017-09-04 DIAGNOSIS — D631 Anemia in chronic kidney disease: Secondary | ICD-10-CM | POA: Diagnosis not present

## 2017-09-04 DIAGNOSIS — I130 Hypertensive heart and chronic kidney disease with heart failure and stage 1 through stage 4 chronic kidney disease, or unspecified chronic kidney disease: Secondary | ICD-10-CM | POA: Diagnosis not present

## 2017-09-04 DIAGNOSIS — C7951 Secondary malignant neoplasm of bone: Secondary | ICD-10-CM | POA: Diagnosis not present

## 2017-09-04 DIAGNOSIS — Z95828 Presence of other vascular implants and grafts: Secondary | ICD-10-CM

## 2017-09-04 LAB — COMPREHENSIVE METABOLIC PANEL
ALK PHOS: 84 U/L (ref 38–126)
ALT: 11 U/L — ABNORMAL LOW (ref 14–54)
ANION GAP: 13 (ref 5–15)
AST: 15 U/L (ref 15–41)
Albumin: 4 g/dL (ref 3.5–5.0)
BUN: 43 mg/dL — ABNORMAL HIGH (ref 6–20)
CALCIUM: 9.2 mg/dL (ref 8.9–10.3)
CO2: 20 mmol/L — ABNORMAL LOW (ref 22–32)
CREATININE: 3.5 mg/dL — AB (ref 0.44–1.00)
Chloride: 101 mmol/L (ref 101–111)
GFR calc non Af Amer: 13 mL/min — ABNORMAL LOW (ref >60)
GFR, EST AFRICAN AMERICAN: 15 mL/min — AB (ref >60)
Glucose, Bld: 104 mg/dL — ABNORMAL HIGH (ref 65–99)
Potassium: 3.8 mmol/L (ref 3.5–5.1)
Sodium: 134 mmol/L — ABNORMAL LOW (ref 135–145)
Total Bilirubin: 0.7 mg/dL (ref 0.3–1.2)
Total Protein: 8 g/dL (ref 6.5–8.1)

## 2017-09-04 LAB — CBC WITH DIFFERENTIAL/PLATELET
BASOS PCT: 2 %
Basophils Absolute: 0.1 10*3/uL (ref 0–0.1)
EOS ABS: 0 10*3/uL (ref 0–0.7)
Eosinophils Relative: 1 %
HCT: 29.4 % — ABNORMAL LOW (ref 35.0–47.0)
HEMOGLOBIN: 10 g/dL — AB (ref 12.0–16.0)
Lymphocytes Relative: 24 %
Lymphs Abs: 0.9 10*3/uL — ABNORMAL LOW (ref 1.0–3.6)
MCH: 35.8 pg — ABNORMAL HIGH (ref 26.0–34.0)
MCHC: 34.2 g/dL (ref 32.0–36.0)
MCV: 104.8 fL — ABNORMAL HIGH (ref 80.0–100.0)
MONOS PCT: 12 %
Monocytes Absolute: 0.4 10*3/uL (ref 0.2–0.9)
NEUTROS PCT: 61 %
Neutro Abs: 2.3 10*3/uL (ref 1.4–6.5)
Platelets: 183 10*3/uL (ref 150–440)
RBC: 2.8 MIL/uL — AB (ref 3.80–5.20)
RDW: 13.5 % (ref 11.5–14.5)
WBC: 3.7 10*3/uL (ref 3.6–11.0)

## 2017-09-04 MED ORDER — HEPARIN SOD (PORK) LOCK FLUSH 100 UNIT/ML IV SOLN
500.0000 [IU] | INTRAVENOUS | Status: AC | PRN
  Administered 2017-09-04: 500 [IU]

## 2017-09-04 MED ORDER — SODIUM CHLORIDE 0.9% FLUSH
10.0000 mL | INTRAVENOUS | Status: AC | PRN
  Administered 2017-09-04: 10 mL
  Filled 2017-09-04: qty 10

## 2017-09-04 NOTE — Progress Notes (Signed)
[place] OFFICE PROGRESS NOTE  Patient Care Team: [name], [name]   SUMMARY OF ONCOLOGIC HISTORY:  Oncology History   # OCT 2015-STAGE IV LEFT BREAST T2N1 [T=4cm; [name]; [name]; [name]; EBUS- Positive Paratrac/subcarinal LN s/p ? Taxotere [in [place]; [name]; [name];[name]; [name]; suv 3]; [name]; [name]; [name];  [name]; Ant R iliac bone; inferior sternum- not on X-geva  # Poorly controlled Blood sugars- improved.   # Pancreatitis Hx/ CKD IV [creat ~ 2-3]     Carcinoma of upper-inner quadrant of left breast in female, estrogen receptor positive ([place])   05/23/2016 Initial Diagnosis    Carcinoma of upper-inner quadrant of left breast in female, estrogen receptor positive ([place])       INTERVAL HISTORY:  A very pleasant 62 year old female patient with above history of metastatic left breast cancer currently on second line therapy with ibrance plus Femara is here for follow-up.  Patient has been followed by nephrology closely for elevated blood pressures.  She has noted to have-mild worsening of the renal function-however this is stable.  No plan for dialysis.  She currently denies any significant weight gain Or weight loss. No unusual bone pain.  No diarrhea. No swelling in legs. No fevers or chills. She denies any unusual shortness of breath or cough. Denies any headaches.  Denies any swelling in the legs.   REVIEW OF SYSTEMS:  A complete 10 point review of system is done which is negative except mentioned above/history of  present illness.   PAST MEDICAL HISTORY :  Past Medical History:  Diagnosis Date  . Asthma   . CHF (congestive heart failure) ([place]) 1997  . CKD (chronic kidney disease)   . Depression   . Diabetes mellitus, type 2 ([place])   . Hair loss   . History of left breast cancer 05/29/14  . History of partial hysterectomy 12/31/2016   Per patient.  Has not had a period in years.  Had a partial hysterectomy years ago.  . Obesity   . Pancreatitis 1997  . Stroke [facility]) 2010   with mild left arm weakness    PAST SURGICAL HISTORY :   Past Surgical History:  Procedure Laterality Date  . CESAREAN SECTION    . CHOLECYSTECTOMY    . PARTIAL HYSTERECTOMY  12/31/2016   Per patient, she has not had a period in years since she had a partial hysterectomy.    FAMILY HISTORY :   Family History  Problem Relation Age of Onset  . Ovarian cancer Mother   . Diabetes Mother   . Hypertension Mother   . COPD Father   . Hypertension Father   . Diabetes Sister   . Diabetes Brother     SOCIAL HISTORY:   Social History   Tobacco Use  . Smoking status: Former Smoker    Packs/day: 0.50    Years: 1.00    Pack years: 0.50    Types: Cigarettes  . Smokeless tobacco: Never Used  Substance Use Topics  . Alcohol use: No    Alcohol/week: 0.0 oz  . Drug use: No  ALLERGIES:  has No Known Allergies.  MEDICATIONS:  Current Outpatient Medications  Medication Sig Dispense Refill  . albuterol (PROAIR HFA) 108 (90 BASE) MCG/ACT inhaler Inhale 1 puff into the lungs every 6 (six) hours as needed for shortness of breath.     Marland Kitchen albuterol (PROVENTIL) (2.5 MG/3ML) 0.083% nebulizer solution Inhale 2.5 mg into the lungs every 6 (six) hours as needed for shortness of breath.     . ALPRAZolam (XANAX) 0.5 MG tablet Take 0.5 mg by mouth at bedtime as needed for anxiety or sleep.     Marland Kitchen amLODipine (NORVASC) 10 MG tablet Take 10 mg by mouth daily.     Marland Kitchen aspirin EC 81 MG tablet Take 81 mg by mouth once.     . B-D  ULTRA-FINE 33 LANCETS MISC Use 1 each 2 (two) times daily.    . B-D ULTRAFINE III SHORT PEN 31G X 8 MM MISC     . bumetanide (BUMEX) 0.5 MG tablet TAKE 1 TABLET BY MOUTH TWICE DAILY    . calcitRIOL (ROCALTROL) 0.25 MCG capsule Take 0.25 mcg by mouth 3 (three) times a week.    . Cinnamon 500 MG capsule Take 500 mg by mouth daily.     . cloNIDine (CATAPRES) 0.2 MG tablet Take 0.2 mg by mouth 2 (two) times daily.     . enalapril (VASOTEC) 20 MG tablet Take 20 mg by mouth 2 (two) times daily.     . ferrous sulfate 325 (65 FE) MG tablet Take 325 mg by mouth 2 (two) times daily with a meal.    . FLUoxetine (PROZAC) 20 MG capsule Take 20 mg by mouth 2 (two) times daily.     Marland Kitchen glucose blood (ONE TOUCH ULTRA TEST) test strip Use 1 each 2 (two) times daily. Use as instructed.    . glyBURIDE (DIABETA) 5 MG tablet Take 5 mg by mouth daily with breakfast.     . IBRANCE 125 MG capsule TAKE 1 CAPSULE BY MOUTH ONE TIME DAILY WITH BREAKFAST FOR 3 WEEKS ON FOLLOWED BY 1 WEEK OFF. TAKE WHOLE WITH FOOD. 21 capsule 6  . letrozole (FEMARA) 2.5 MG tablet TAKE 1 TABLET BY MOUTH DAILY 90 tablet 0  . LEVEMIR FLEXTOUCH 100 UNIT/ML Pen Inject 55 Units into the skin daily.     . mometasone (NASONEX) 50 MCG/ACT nasal spray Place 2 sprays into the nose daily as needed.     Marland Kitchen NOVOLOG FLEXPEN 100 UNIT/ML FlexPen Inject 7 Units into the skin 2 (two) times daily at 8 am and 10 pm.     . salmeterol (SEREVENT) 50 MCG/DOSE diskus inhaler Inhale 1 puff into the lungs 2 (two) times daily.    . simvastatin (ZOCOR) 20 MG tablet Take 20 mg by mouth daily at 6 PM.     . vitamin B-12 (CYANOCOBALAMIN) 1000 MCG tablet Take 1,000 mcg by mouth daily.    Marland Kitchen acetaminophen-codeine (TYLENOL #4) 300-60 MG tablet Take 2 tablets by mouth every 6 (six) hours as needed for moderate pain.     Marland Kitchen atenolol (TENORMIN) 100 MG tablet TAKE 1 TABLETS BY MOUTH TWICE DAILY    . metoprolol tartrate (LOPRESSOR) 25 MG tablet Take 1 tablet (25 mg total) by mouth 2  (two) times daily. (Patient not taking: Reported on 09/04/2017) 60 tablet 3   No current facility-administered medications for this visit.    Facility-Administered Medications Ordered in Other Visits  Medication Dose Route Frequency Provider Last Rate Last Dose  . sodium chloride  flush (NS) 0.9 % injection 10 mL  10 mL Intravenous PRN Cammie Sickle, MD   10 mL at 01/30/16 1054    PHYSICAL EXAMINATION: ECOG PERFORMANCE STATUS: 0 - Asymptomatic  BP (!) 160/93 (BP Location: Right Arm, Patient Position: Sitting)   Pulse 71   Temp 97.7 F (36.5 C) (Oral)   Wt 193 lb 9.6 oz (87.8 kg)   SpO2 100%   BMI 35.41 kg/m   Filed Weights   09/04/17 1016  Weight: 193 lb 9.6 oz (87.8 kg)    GENERAL: Well-nourished well-developed; Alert, no distress and comfortable.  She is alone.  EYES: no pallor or icterus OROPHARYNX: no thrush or ulceration; good dentition  NECK: supple, no masses felt LYMPH:  no palpable lymphadenopathy in the cervical, axillary or inguinal regions LUNGS: clear to auscultation and  No wheeze or crackles HEART/CVS: regular rate & rhythm and no murmurs; No lower extremity edema ABDOMEN:abdomen soft, non-tender and normal bowel sounds Musculoskeletal:no cyanosis of digits and no clubbing  PSYCH: alert & oriented x 3 with fluent speech NEURO: no focal motor/sensory deficits SKIN:  No rash.     LABORATORY DATA:  I have reviewed the data as listed    Component Value Date/Time   NA 134 (L) 09/04/2017 0950   NA 130 (L) 06/06/2014 1102   K 3.8 09/04/2017 0950   K 3.9 06/06/2014 1102   CL 101 09/04/2017 0950   CL 95 (L) 06/06/2014 1102   CO2 20 (L) 09/04/2017 0950   CO2 28 06/06/2014 1102   GLUCOSE 104 (H) 09/04/2017 0950   GLUCOSE 349 (H) 06/06/2014 1102   BUN 43 (H) 09/04/2017 0950   BUN 17 06/06/2014 1102   CREATININE 3.50 (H) 09/04/2017 0950   CREATININE 1.63 (H) 06/06/2014 1102   CALCIUM 9.2 09/04/2017 0950   CALCIUM 9.2 06/06/2014 1102   PROT 8.0  09/04/2017 0950   PROT 8.2 06/06/2014 1102   ALBUMIN 4.0 09/04/2017 0950   ALBUMIN 3.3 (L) 06/06/2014 1102   AST 15 09/04/2017 0950   AST 7 (L) 06/06/2014 1102   ALT 11 (L) 09/04/2017 0950   ALT 12 (L) 06/06/2014 1102   ALKPHOS 84 09/04/2017 0950   ALKPHOS 74 06/06/2014 1102   BILITOT 0.7 09/04/2017 0950   BILITOT 0.4 06/06/2014 1102   GFRNONAA 13 (L) 09/04/2017 0950   GFRNONAA 35 (L) 06/06/2014 1102   GFRAA 15 (L) 09/04/2017 0950   GFRAA 42 (L) 06/06/2014 1102    No results found for: SPEP, UPEP  Lab Results  Component Value Date   WBC 3.7 09/04/2017   NEUTROABS 2.3 09/04/2017   HGB 10.0 (L) 09/04/2017   HCT 29.4 (L) 09/04/2017   MCV 104.8 (H) 09/04/2017   PLT 183 09/04/2017      Chemistry      Component Value Date/Time   NA 134 (L) 09/04/2017 0950   NA 130 (L) 06/06/2014 1102   K 3.8 09/04/2017 0950   K 3.9 06/06/2014 1102   CL 101 09/04/2017 0950   CL 95 (L) 06/06/2014 1102   CO2 20 (L) 09/04/2017 0950   CO2 28 06/06/2014 1102   BUN 43 (H) 09/04/2017 0950   BUN 17 06/06/2014 1102   CREATININE 3.50 (H) 09/04/2017 0950   CREATININE 1.63 (H) 06/06/2014 1102      Component Value Date/Time   CALCIUM 9.2 09/04/2017 0950   CALCIUM 9.2 06/06/2014 1102   ALKPHOS 84 09/04/2017 0950   ALKPHOS 74 06/06/2014 1102   AST 15  09/04/2017 0950   AST 7 (L) 06/06/2014 1102   ALT 11 (L) 09/04/2017 0950   ALT 12 (L) 06/06/2014 1102   BILITOT 0.7 09/04/2017 0950   BILITOT 0.4 06/06/2014 1102     Results for THEA, HOLSHOUSER (MRN [id number]) as of 09/04/2017 10:26  Ref. Range 04/09/2017 10:04 05/07/2017 09:34 06/04/2017 10:16 07/09/2017 10:29 08/07/2017 09:35  CA 27.29 Latest Ref Range: 0.0 - 38.6 U/mL 92.2 (H) 98.0 (H) 109.7 (H) 114.0 (H) 105.9 (H)   IMPRESSION: 1. 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 No new sites of metastatic disease. 2. Persistent mild hypermetabolism associated with soft tissue density in the supraumbilical right paramidline ventral  abdominal wall. 3. 10 mm right lower lobe nodule, stable and decreased in size from 06/14/2014. 4.  Aortic atherosclerosis (ICD10-170.0).   Electronically Signed   By: Lorin Picket M.D.   On: 05/13/2017 11:48   ASSESSMENT & PLAN:  Carcinoma of upper-inner quadrant of left breast in female, estrogen receptor positive ([place]) # Left breast cancer stage IV/oligometastatic to the mediastinal lymph nodes currently  with ibrance and Femara since March 2016. PET Oct 10th 2018-- no progression/improved left breast mass/ stable-treated bone lesions; except for Right acetabular STABLE uptake [asymptomatic; monitor for now]; Ca-27-29-RISING ~105 in dec 2018.  # Clinically  NO evidence of progression-I reviewed my concerns of increasing tumor marker; although PET scan October 2018-stable. Rising/stable- tumor marker.  # Patient will continue Femara with ibrance 125mg  3 w-On; 1 W-off [again started 2/2 Labs today reviewed;  acceptable for continue.   #Elevated blood pressure-recommend close monitoring at home; followed closely by Nephrology.   # Anemia/ IDA-CKD- Hb 9-10-STABLE; on PO iron.   # CKD Stage IV- creat 3.5 better; but  No dialysis yet. Monitored closely nephrology.  # follow up in 4 weeks/ labs; ca 27-29.     Cammie Sickle, MD 09/04/2017 12:14 PM

## 2017-09-04 NOTE — Addendum Note (Signed)
Addended by: Mallie Snooks I on: 09/04/2017 10:06 AM   Modules accepted: Orders, SmartSet

## 2017-09-04 NOTE — Assessment & Plan Note (Addendum)
#   Left breast cancer stage IV/oligometastatic to the mediastinal lymph nodes currently  with ibrance and Femara since March 2016. PET Oct 10th 2018-- no progression/improved left breast mass/ stable-treated bone lesions; except for Right acetabular STABLE uptake [asymptomatic; monitor for now]; Ca-27-29-RISING ~105 in dec 2018.  # Clinically  NO evidence of progression-I reviewed my concerns of increasing tumor marker; although PET scan October 2018-stable. Rising/stable- tumor marker.  # Patient will continue Femara with ibrance 125mg  3 w-On; 1 W-off [again started 2/2 Labs today reviewed;  acceptable for continue.   #Elevated blood pressure-recommend close monitoring at home; followed closely by Nephrology.   # Anemia/ IDA-CKD- Hb 9-10-STABLE; on PO iron.   # CKD Stage IV- creat 3.5 better; but  No dialysis yet. Monitored closely nephrology.  # follow up in 4 weeks/ labs; ca 27-29.

## 2017-09-05 LAB — CANCER ANTIGEN 27.29: CAN 27.29: 106.3 U/mL — AB (ref 0.0–38.6)

## 2017-09-21 ENCOUNTER — Other Ambulatory Visit: Payer: Self-pay | Admitting: Internal Medicine

## 2017-09-21 DIAGNOSIS — C50912 Malignant neoplasm of unspecified site of left female breast: Secondary | ICD-10-CM

## 2017-10-02 ENCOUNTER — Inpatient Hospital Stay: Payer: Medicare Other | Attending: Internal Medicine

## 2017-10-02 ENCOUNTER — Encounter: Payer: Self-pay | Admitting: Internal Medicine

## 2017-10-02 ENCOUNTER — Inpatient Hospital Stay (HOSPITAL_BASED_OUTPATIENT_CLINIC_OR_DEPARTMENT_OTHER): Payer: Medicare Other | Admitting: Internal Medicine

## 2017-10-02 VITALS — BP 159/88 | HR 76 | Temp 97.1°F | Resp 16 | Wt 188.6 lb

## 2017-10-02 DIAGNOSIS — Z7982 Long term (current) use of aspirin: Secondary | ICD-10-CM | POA: Diagnosis not present

## 2017-10-02 DIAGNOSIS — Z79899 Other long term (current) drug therapy: Secondary | ICD-10-CM | POA: Diagnosis not present

## 2017-10-02 DIAGNOSIS — Z794 Long term (current) use of insulin: Secondary | ICD-10-CM

## 2017-10-02 DIAGNOSIS — Z90711 Acquired absence of uterus with remaining cervical stump: Secondary | ICD-10-CM | POA: Diagnosis not present

## 2017-10-02 DIAGNOSIS — R911 Solitary pulmonary nodule: Secondary | ICD-10-CM

## 2017-10-02 DIAGNOSIS — Z8041 Family history of malignant neoplasm of ovary: Secondary | ICD-10-CM

## 2017-10-02 DIAGNOSIS — Z8673 Personal history of transient ischemic attack (TIA), and cerebral infarction without residual deficits: Secondary | ICD-10-CM

## 2017-10-02 DIAGNOSIS — J45909 Unspecified asthma, uncomplicated: Secondary | ICD-10-CM | POA: Insufficient documentation

## 2017-10-02 DIAGNOSIS — I130 Hypertensive heart and chronic kidney disease with heart failure and stage 1 through stage 4 chronic kidney disease, or unspecified chronic kidney disease: Secondary | ICD-10-CM | POA: Insufficient documentation

## 2017-10-02 DIAGNOSIS — C50212 Malignant neoplasm of upper-inner quadrant of left female breast: Secondary | ICD-10-CM

## 2017-10-02 DIAGNOSIS — Z170 Estrogen receptor positive status [ER+]: Secondary | ICD-10-CM

## 2017-10-02 DIAGNOSIS — C7951 Secondary malignant neoplasm of bone: Secondary | ICD-10-CM | POA: Diagnosis not present

## 2017-10-02 DIAGNOSIS — I700 Atherosclerosis of aorta: Secondary | ICD-10-CM

## 2017-10-02 DIAGNOSIS — Z79811 Long term (current) use of aromatase inhibitors: Secondary | ICD-10-CM | POA: Insufficient documentation

## 2017-10-02 DIAGNOSIS — I509 Heart failure, unspecified: Secondary | ICD-10-CM | POA: Insufficient documentation

## 2017-10-02 DIAGNOSIS — D509 Iron deficiency anemia, unspecified: Secondary | ICD-10-CM

## 2017-10-02 DIAGNOSIS — E1122 Type 2 diabetes mellitus with diabetic chronic kidney disease: Secondary | ICD-10-CM

## 2017-10-02 DIAGNOSIS — Z90710 Acquired absence of both cervix and uterus: Secondary | ICD-10-CM

## 2017-10-02 DIAGNOSIS — N184 Chronic kidney disease, stage 4 (severe): Secondary | ICD-10-CM | POA: Insufficient documentation

## 2017-10-02 DIAGNOSIS — Z87891 Personal history of nicotine dependence: Secondary | ICD-10-CM | POA: Diagnosis not present

## 2017-10-02 DIAGNOSIS — Z95828 Presence of other vascular implants and grafts: Secondary | ICD-10-CM

## 2017-10-02 LAB — COMPREHENSIVE METABOLIC PANEL
ALK PHOS: 62 U/L (ref 38–126)
ALT: 10 U/L — ABNORMAL LOW (ref 14–54)
AST: 15 U/L (ref 15–41)
Albumin: 3.7 g/dL (ref 3.5–5.0)
Anion gap: 7 (ref 5–15)
BILIRUBIN TOTAL: 0.4 mg/dL (ref 0.3–1.2)
BUN: 58 mg/dL — AB (ref 6–20)
CALCIUM: 9.1 mg/dL (ref 8.9–10.3)
CO2: 23 mmol/L (ref 22–32)
Chloride: 105 mmol/L (ref 101–111)
Creatinine, Ser: 3.9 mg/dL — ABNORMAL HIGH (ref 0.44–1.00)
GFR calc Af Amer: 13 mL/min — ABNORMAL LOW (ref >60)
GFR calc non Af Amer: 12 mL/min — ABNORMAL LOW (ref >60)
Glucose, Bld: 88 mg/dL (ref 65–99)
POTASSIUM: 4.4 mmol/L (ref 3.5–5.1)
Sodium: 135 mmol/L (ref 135–145)
TOTAL PROTEIN: 8.1 g/dL (ref 6.5–8.1)

## 2017-10-02 LAB — CBC WITH DIFFERENTIAL/PLATELET
BASOS ABS: 0.1 10*3/uL (ref 0–0.1)
BASOS PCT: 1 %
EOS ABS: 0 10*3/uL (ref 0–0.7)
Eosinophils Relative: 0 %
HEMATOCRIT: 25.4 % — AB (ref 35.0–47.0)
HEMOGLOBIN: 8.9 g/dL — AB (ref 12.0–16.0)
Lymphocytes Relative: 16 %
Lymphs Abs: 0.6 10*3/uL — ABNORMAL LOW (ref 1.0–3.6)
MCH: 36.2 pg — ABNORMAL HIGH (ref 26.0–34.0)
MCHC: 34.9 g/dL (ref 32.0–36.0)
MCV: 103.7 fL — ABNORMAL HIGH (ref 80.0–100.0)
MONOS PCT: 11 %
Monocytes Absolute: 0.4 10*3/uL (ref 0.2–0.9)
NEUTROS ABS: 2.8 10*3/uL (ref 1.4–6.5)
NEUTROS PCT: 72 %
Platelets: 244 10*3/uL (ref 150–440)
RBC: 2.45 MIL/uL — AB (ref 3.80–5.20)
RDW: 13.1 % (ref 11.5–14.5)
WBC: 3.9 10*3/uL (ref 3.6–11.0)

## 2017-10-02 MED ORDER — SODIUM CHLORIDE 0.9% FLUSH
10.0000 mL | INTRAVENOUS | Status: DC | PRN
  Administered 2017-10-02: 10 mL via INTRAVENOUS
  Filled 2017-10-02: qty 10

## 2017-10-02 MED ORDER — HEPARIN SOD (PORK) LOCK FLUSH 100 UNIT/ML IV SOLN
500.0000 [IU] | Freq: Once | INTRAVENOUS | Status: AC
  Administered 2017-10-02: 500 [IU] via INTRAVENOUS

## 2017-10-02 NOTE — Assessment & Plan Note (Addendum)
#   Left breast cancer stage IV/oligometastatic to the mediastinal lymph nodes currently  with ibrance and Femara since March 2016. PET Oct 10th 2018-- no progression/improved left breast mass/ stable-treated bone lesions; except for Right acetabular STABLE uptake [asymptomatic; monitor for now]; Ca-27-29-RISING ~105 in dec 2018.  # Clinically  NO evidence of progression-I reviewed my concerns of increasing tumor marker; although PET scan October 2018-stable. Rising/stable- tumor marker ~107. Will get PET scan prior to next visit.   # Patient will continue Femara with ibrance 125mg  3 w-On; 1 W-off [again started 2/2 Labs today reviewed;  acceptable for continue.   #Elevated blood pressure-recommend close monitoring at home; followed closely by Nephrology.   # Anemia/ IDA-CKD- Hb 8.9/ recommend compliance with PO iron.   # CKD Stage IV- creat 3.5/STABLE; but  No dialysis yet. Monitored closely nephrology.  # follow up in 4 weeks/ labs; ca 27-29. PET prior.

## 2017-10-02 NOTE — Progress Notes (Signed)
[place] OFFICE PROGRESS NOTE  Patient Care Team: [name], [name]   SUMMARY OF ONCOLOGIC HISTORY:  Oncology History   # OCT 2015-STAGE IV LEFT BREAST T2N1 [T=4cm; [name]; [name]; [name]; EBUS- Positive Paratrac/subcarinal LN s/p ? Taxotere [in [place]; [name]; [name];[name]; [name]; suv 3]; [name]; [name]; [name];  [name]; Ant R iliac bone; inferior sternum- not on X-geva  # Poorly controlled Blood sugars- improved.   # Pancreatitis Hx/ CKD IV [creat ~ 2-3]     Carcinoma of upper-inner quadrant of left breast in female, estrogen receptor positive ([place])   05/23/2016 Initial Diagnosis    Carcinoma of upper-inner quadrant of left breast in female, estrogen receptor positive ([place])       INTERVAL HISTORY:  A very pleasant 62 year old female patient with above history of metastatic left breast cancer currently on second line therapy with ibrance plus Femara is here for follow-up.  Patient continues to deny any unusual weight gain or weight loss.  No swelling in the legs.  Follows up with nephrology closely.  States that she has been missing her iron pills recently.  Otherwise denies any blood in stools or black stools.  REVIEW OF SYSTEMS:  A complete 10 point review of system is done which is negative except mentioned above/history of present illness.   PAST MEDICAL HISTORY :  Past Medical History:  Diagnosis Date  . Asthma   . CHF (congestive heart failure) ([place]) 1997  . CKD (chronic kidney disease)   . Depression   .  Diabetes mellitus, type 2 ([place])   . Hair loss   . History of left breast cancer 05/29/14  . History of partial hysterectomy 12/31/2016   Per patient.  Has not had a period in years.  Had a partial hysterectomy years ago.  . Obesity   . Pancreatitis 1997  . Stroke [facility]) 2010   with mild left arm weakness    PAST SURGICAL HISTORY :   Past Surgical History:  Procedure Laterality Date  . CESAREAN SECTION    . CHOLECYSTECTOMY    . PARTIAL HYSTERECTOMY  12/31/2016   Per patient, she has not had a period in years since she had a partial hysterectomy.    FAMILY HISTORY :   Family History  Problem Relation Age of Onset  . Ovarian cancer Mother   . Diabetes Mother   . Hypertension Mother   . COPD Father   . Hypertension Father   . Diabetes Sister   . Diabetes Brother     SOCIAL HISTORY:   Social History   Tobacco Use  . Smoking status: Former Smoker    Packs/day: 0.50    Years: 1.00    Pack years: 0.50    Types: Cigarettes  . Smokeless tobacco: Never Used  Substance Use Topics  . Alcohol use: No    Alcohol/week: 0.0 oz  . Drug use: No    ALLERGIES:  has No Known Allergies.  MEDICATIONS:  Current Outpatient Medications  Medication Sig Dispense Refill  . acetaminophen-codeine (TYLENOL #4) 300-60 MG tablet Take 2 tablets by mouth  every 6 (six) hours as needed for moderate pain.     Marland Kitchen albuterol (PROAIR HFA) 108 (90 BASE) MCG/ACT inhaler Inhale 1 puff into the lungs every 6 (six) hours as needed for shortness of breath.     Marland Kitchen albuterol (PROVENTIL) (2.5 MG/3ML) 0.083% nebulizer solution Inhale 2.5 mg into the lungs every 6 (six) hours as needed for shortness of breath.     . ALPRAZolam (XANAX) 0.5 MG tablet Take 0.5 mg by mouth at bedtime as needed for anxiety or sleep.     Marland Kitchen amLODipine (NORVASC) 10 MG tablet Take 10 mg by mouth daily.     Marland Kitchen aspirin EC 81 MG tablet Take 81 mg by mouth once.     Marland Kitchen atenolol (TENORMIN) 100 MG tablet TAKE 1 TABLETS BY MOUTH TWICE DAILY    .  B-D ULTRA-FINE 33 LANCETS MISC Use 1 each 2 (two) times daily.    . B-D ULTRAFINE III SHORT PEN 31G X 8 MM MISC     . bumetanide (BUMEX) 0.5 MG tablet TAKE 1 TABLET BY MOUTH TWICE DAILY    . calcitRIOL (ROCALTROL) 0.25 MCG capsule Take 0.25 mcg by mouth 3 (three) times a week.    . Cinnamon 500 MG capsule Take 500 mg by mouth daily.     . cloNIDine (CATAPRES) 0.2 MG tablet Take 0.2 mg by mouth 2 (two) times daily.     . enalapril (VASOTEC) 20 MG tablet Take 20 mg by mouth 2 (two) times daily.     . ferrous sulfate 325 (65 FE) MG tablet Take 325 mg by mouth 2 (two) times daily with a meal.    . FLUoxetine (PROZAC) 20 MG capsule Take 20 mg by mouth 2 (two) times daily.     Marland Kitchen glucose blood (ONE TOUCH ULTRA TEST) test strip Use 1 each 2 (two) times daily. Use as instructed.    . glyBURIDE (DIABETA) 5 MG tablet Take 5 mg by mouth daily with breakfast.     . IBRANCE 125 MG capsule TAKE 1 CAPSULE BY MOUTH ONE TIME DAILY WITH BREAKFAST FOR 3 WEEKS ON FOLLOWED BY 1 WEEK OFF. TAKE WHOLE WITH FOOD. 21 capsule 6  . letrozole (FEMARA) 2.5 MG tablet TAKE 1 TABLET BY MOUTH DAILY 90 tablet 0  . LEVEMIR FLEXTOUCH 100 UNIT/ML Pen Inject 55 Units into the skin daily.     . metoprolol tartrate (LOPRESSOR) 25 MG tablet Take 1 tablet (25 mg total) by mouth 2 (two) times daily. 60 tablet 3  . mometasone (NASONEX) 50 MCG/ACT nasal spray Place 2 sprays into the nose daily as needed.     Marland Kitchen NOVOLOG FLEXPEN 100 UNIT/ML FlexPen Inject 7 Units into the skin 2 (two) times daily at 8 am and 10 pm.     . salmeterol (SEREVENT) 50 MCG/DOSE diskus inhaler Inhale 1 puff into the lungs 2 (two) times daily.    . simvastatin (ZOCOR) 20 MG tablet Take 20 mg by mouth daily at 6 PM.     . vitamin B-12 (CYANOCOBALAMIN) 1000 MCG tablet Take 1,000 mcg by mouth daily.     No current facility-administered medications for this visit.    Facility-Administered Medications Ordered in Other Visits  Medication Dose Route Frequency Provider Last  Rate Last Dose  . sodium chloride flush (NS) 0.9 % injection 10 mL  10 mL Intravenous PRN Cammie Sickle, MD   10 mL at 01/30/16 1054    PHYSICAL EXAMINATION: ECOG PERFORMANCE STATUS: 0 - Asymptomatic  BP Marland Kitchen)  159/88 (BP Location: Left Arm, Patient Position: Sitting)   Pulse 76   Temp (!) 97.1 F (36.2 C)   Resp 16   Wt 188 lb 9.6 oz (85.5 kg)   BMI 34.50 kg/m   Filed Weights   10/02/17 1003  Weight: 188 lb 9.6 oz (85.5 kg)    GENERAL: Well-nourished well-developed; Alert, no distress and comfortable.  She is alone.  EYES: no pallor or icterus OROPHARYNX: no thrush or ulceration; good dentition  NECK: supple, no masses felt LYMPH:  no palpable lymphadenopathy in the cervical, axillary or inguinal regions LUNGS: clear to auscultation and  No wheeze or crackles HEART/CVS: regular rate & rhythm and no murmurs; No lower extremity edema ABDOMEN:abdomen soft, non-tender and normal bowel sounds Musculoskeletal:no cyanosis of digits and no clubbing  PSYCH: alert & oriented x 3 with fluent speech NEURO: no focal motor/sensory deficits SKIN:  No rash.     LABORATORY DATA:  I have reviewed the data as listed    Component Value Date/Time   NA 135 10/02/2017 0908   NA 130 (L) 06/06/2014 1102   K 4.4 10/02/2017 0908   K 3.9 06/06/2014 1102   CL 105 10/02/2017 0908   CL 95 (L) 06/06/2014 1102   CO2 23 10/02/2017 0908   CO2 28 06/06/2014 1102   GLUCOSE 88 10/02/2017 0908   GLUCOSE 349 (H) 06/06/2014 1102   BUN 58 (H) 10/02/2017 0908   BUN 17 06/06/2014 1102   CREATININE 3.90 (H) 10/02/2017 0908   CREATININE 1.63 (H) 06/06/2014 1102   CALCIUM 9.1 10/02/2017 0908   CALCIUM 9.2 06/06/2014 1102   PROT 8.1 10/02/2017 0908   PROT 8.2 06/06/2014 1102   ALBUMIN 3.7 10/02/2017 0908   ALBUMIN 3.3 (L) 06/06/2014 1102   AST 15 10/02/2017 0908   AST 7 (L) 06/06/2014 1102   ALT 10 (L) 10/02/2017 0908   ALT 12 (L) 06/06/2014 1102   ALKPHOS 62 10/02/2017 0908   ALKPHOS 74  06/06/2014 1102   BILITOT 0.4 10/02/2017 0908   BILITOT 0.4 06/06/2014 1102   GFRNONAA 12 (L) 10/02/2017 0908   GFRNONAA 35 (L) 06/06/2014 1102   GFRAA 13 (L) 10/02/2017 0908   GFRAA 42 (L) 06/06/2014 1102    No results found for: SPEP, UPEP  Lab Results  Component Value Date   WBC 3.9 10/02/2017   NEUTROABS 2.8 10/02/2017   HGB 8.9 (L) 10/02/2017   HCT 25.4 (L) 10/02/2017   MCV 103.7 (H) 10/02/2017   PLT 244 10/02/2017      Chemistry      Component Value Date/Time   NA 135 10/02/2017 0908   NA 130 (L) 06/06/2014 1102   K 4.4 10/02/2017 0908   K 3.9 06/06/2014 1102   CL 105 10/02/2017 0908   CL 95 (L) 06/06/2014 1102   CO2 23 10/02/2017 0908   CO2 28 06/06/2014 1102   BUN 58 (H) 10/02/2017 0908   BUN 17 06/06/2014 1102   CREATININE 3.90 (H) 10/02/2017 0908   CREATININE 1.63 (H) 06/06/2014 1102      Component Value Date/Time   CALCIUM 9.1 10/02/2017 0908   CALCIUM 9.2 06/06/2014 1102   ALKPHOS 62 10/02/2017 0908   ALKPHOS 74 06/06/2014 1102   AST 15 10/02/2017 0908   AST 7 (L) 06/06/2014 1102   ALT 10 (L) 10/02/2017 0908   ALT 12 (L) 06/06/2014 1102   BILITOT 0.4 10/02/2017 0908   BILITOT 0.4 06/06/2014 1102     Results for Lotts,  JULIEANNE HADSALL (MRN [id number]) as of 10/02/2017 10:25  Ref. Range 05/07/2017 09:34 06/04/2017 10:16 07/09/2017 10:29 08/07/2017 09:35 09/04/2017 09:50  CA 27.29 Latest Ref Range: 0.0 - 38.6 U/mL 98.0 (H) 109.7 (H) 114.0 (H) 105.9 (H) 106.3 (H)    IMPRESSION: 1. 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 No new sites of metastatic disease. 2. Persistent mild hypermetabolism associated with soft tissue density in the supraumbilical right paramidline ventral abdominal wall. 3. 10 mm right lower lobe nodule, stable and decreased in size from 06/14/2014. 4.  Aortic atherosclerosis (ICD10-170.0).   Electronically Signed   By: Lorin Picket M.D.   On: 05/13/2017 11:48   ASSESSMENT & PLAN:  Carcinoma of upper-inner quadrant of left  breast in female, estrogen receptor positive ([place]) # Left breast cancer stage IV/oligometastatic to the mediastinal lymph nodes currently  with ibrance and Femara since March 2016. PET Oct 10th 2018-- no progression/improved left breast mass/ stable-treated bone lesions; except for Right acetabular STABLE uptake [asymptomatic; monitor for now]; Ca-27-29-RISING ~105 in dec 2018.  # Clinically  NO evidence of progression-I reviewed my concerns of increasing tumor marker; although PET scan October 2018-stable. Rising/stable- tumor marker ~107. Will get PET scan prior to next visit.   # Patient will continue Femara with ibrance 125mg  3 w-On; 1 W-off [again started 2/2 Labs today reviewed;  acceptable for continue.   #Elevated blood pressure-recommend close monitoring at home; followed closely by Nephrology.   # Anemia/ IDA-CKD- Hb 8.9/ recommend compliance with PO iron.   # CKD Stage IV- creat 3.5/STABLE; but  No dialysis yet. Monitored closely nephrology.  # follow up in 4 weeks/ labs; ca 27-29. PET prior.     Cammie Sickle, MD 10/03/2017 11:16 PM

## 2017-10-03 LAB — CANCER ANTIGEN 27.29: CAN 27.29: 100.4 U/mL — AB (ref 0.0–38.6)

## 2017-10-15 DIAGNOSIS — J4520 Mild intermittent asthma, uncomplicated: Secondary | ICD-10-CM | POA: Diagnosis not present

## 2017-10-15 DIAGNOSIS — I5022 Chronic systolic (congestive) heart failure: Secondary | ICD-10-CM | POA: Diagnosis not present

## 2017-10-15 DIAGNOSIS — Z0000 Encounter for general adult medical examination without abnormal findings: Secondary | ICD-10-CM | POA: Diagnosis not present

## 2017-10-15 DIAGNOSIS — I340 Nonrheumatic mitral (valve) insufficiency: Secondary | ICD-10-CM | POA: Diagnosis not present

## 2017-10-15 DIAGNOSIS — R143 Flatulence: Secondary | ICD-10-CM | POA: Diagnosis not present

## 2017-10-15 DIAGNOSIS — G8929 Other chronic pain: Secondary | ICD-10-CM | POA: Diagnosis not present

## 2017-10-15 DIAGNOSIS — I10 Essential (primary) hypertension: Secondary | ICD-10-CM | POA: Diagnosis not present

## 2017-10-15 DIAGNOSIS — N184 Chronic kidney disease, stage 4 (severe): Secondary | ICD-10-CM | POA: Diagnosis not present

## 2017-10-15 DIAGNOSIS — C50812 Malignant neoplasm of overlapping sites of left female breast: Secondary | ICD-10-CM | POA: Diagnosis not present

## 2017-10-15 DIAGNOSIS — E1122 Type 2 diabetes mellitus with diabetic chronic kidney disease: Secondary | ICD-10-CM | POA: Diagnosis not present

## 2017-10-15 DIAGNOSIS — Z794 Long term (current) use of insulin: Secondary | ICD-10-CM | POA: Diagnosis not present

## 2017-11-02 ENCOUNTER — Ambulatory Visit
Admission: RE | Admit: 2017-11-02 | Discharge: 2017-11-02 | Disposition: A | Discharge status: Home / Self Care | Payer: Medicare Other | Source: Ambulatory Visit | Attending: Internal Medicine | Admitting: Internal Medicine

## 2017-11-02 DIAGNOSIS — C50212 Malignant neoplasm of upper-inner quadrant of left female breast: Secondary | ICD-10-CM | POA: Diagnosis not present

## 2017-11-02 DIAGNOSIS — M899 Disorder of bone, unspecified: Secondary | ICD-10-CM | POA: Diagnosis not present

## 2017-11-02 DIAGNOSIS — I700 Atherosclerosis of aorta: Secondary | ICD-10-CM | POA: Diagnosis not present

## 2017-11-02 DIAGNOSIS — I517 Cardiomegaly: Secondary | ICD-10-CM | POA: Insufficient documentation

## 2017-11-02 DIAGNOSIS — Z5111 Encounter for antineoplastic chemotherapy: Secondary | ICD-10-CM | POA: Diagnosis not present

## 2017-11-02 DIAGNOSIS — J320 Chronic maxillary sinusitis: Secondary | ICD-10-CM | POA: Diagnosis not present

## 2017-11-02 DIAGNOSIS — Z170 Estrogen receptor positive status [ER+]: Secondary | ICD-10-CM | POA: Insufficient documentation

## 2017-11-02 DIAGNOSIS — J321 Chronic frontal sinusitis: Secondary | ICD-10-CM | POA: Diagnosis not present

## 2017-11-02 DIAGNOSIS — J323 Chronic sphenoidal sinusitis: Secondary | ICD-10-CM | POA: Diagnosis not present

## 2017-11-02 DIAGNOSIS — C50912 Malignant neoplasm of unspecified site of left female breast: Secondary | ICD-10-CM | POA: Diagnosis not present

## 2017-11-02 DIAGNOSIS — R918 Other nonspecific abnormal finding of lung field: Secondary | ICD-10-CM | POA: Diagnosis not present

## 2017-11-02 LAB — GLUCOSE, CAPILLARY: Glucose-Capillary: 96 mg/dL (ref 65–99)

## 2017-11-02 MED ORDER — FLUDEOXYGLUCOSE F - 18 (FDG) INJECTION
9.6300 | Freq: Once | INTRAVENOUS | Status: AC | PRN
  Administered 2017-11-02: 9.63 via INTRAVENOUS

## 2017-11-04 ENCOUNTER — Other Ambulatory Visit: Payer: Self-pay | Admitting: Internal Medicine

## 2017-11-05 ENCOUNTER — Other Ambulatory Visit: Payer: Self-pay | Admitting: *Deleted

## 2017-11-05 DIAGNOSIS — Z170 Estrogen receptor positive status [ER+]: Principal | ICD-10-CM

## 2017-11-05 DIAGNOSIS — C50212 Malignant neoplasm of upper-inner quadrant of left female breast: Secondary | ICD-10-CM

## 2017-11-06 ENCOUNTER — Inpatient Hospital Stay: Payer: Medicare Other | Attending: Internal Medicine

## 2017-11-06 ENCOUNTER — Inpatient Hospital Stay (HOSPITAL_BASED_OUTPATIENT_CLINIC_OR_DEPARTMENT_OTHER): Payer: Medicare Other | Admitting: Internal Medicine

## 2017-11-06 ENCOUNTER — Encounter: Payer: Self-pay | Admitting: Internal Medicine

## 2017-11-06 VITALS — BP 146/83 | HR 71 | Temp 97.6°F | Resp 20 | Ht 62.0 in | Wt 189.0 lb

## 2017-11-06 DIAGNOSIS — Z79899 Other long term (current) drug therapy: Secondary | ICD-10-CM | POA: Diagnosis not present

## 2017-11-06 DIAGNOSIS — D509 Iron deficiency anemia, unspecified: Secondary | ICD-10-CM

## 2017-11-06 DIAGNOSIS — C50212 Malignant neoplasm of upper-inner quadrant of left female breast: Secondary | ICD-10-CM

## 2017-11-06 DIAGNOSIS — Z8673 Personal history of transient ischemic attack (TIA), and cerebral infarction without residual deficits: Secondary | ICD-10-CM

## 2017-11-06 DIAGNOSIS — Z7982 Long term (current) use of aspirin: Secondary | ICD-10-CM | POA: Insufficient documentation

## 2017-11-06 DIAGNOSIS — I509 Heart failure, unspecified: Secondary | ICD-10-CM

## 2017-11-06 DIAGNOSIS — Z170 Estrogen receptor positive status [ER+]: Secondary | ICD-10-CM | POA: Diagnosis not present

## 2017-11-06 DIAGNOSIS — E1122 Type 2 diabetes mellitus with diabetic chronic kidney disease: Secondary | ICD-10-CM

## 2017-11-06 DIAGNOSIS — C782 Secondary malignant neoplasm of pleura: Secondary | ICD-10-CM | POA: Diagnosis not present

## 2017-11-06 DIAGNOSIS — D631 Anemia in chronic kidney disease: Secondary | ICD-10-CM | POA: Diagnosis not present

## 2017-11-06 DIAGNOSIS — I700 Atherosclerosis of aorta: Secondary | ICD-10-CM | POA: Diagnosis not present

## 2017-11-06 DIAGNOSIS — J45909 Unspecified asthma, uncomplicated: Secondary | ICD-10-CM | POA: Insufficient documentation

## 2017-11-06 DIAGNOSIS — N184 Chronic kidney disease, stage 4 (severe): Secondary | ICD-10-CM | POA: Diagnosis not present

## 2017-11-06 DIAGNOSIS — Z87891 Personal history of nicotine dependence: Secondary | ICD-10-CM | POA: Insufficient documentation

## 2017-11-06 DIAGNOSIS — I252 Old myocardial infarction: Secondary | ICD-10-CM | POA: Insufficient documentation

## 2017-11-06 DIAGNOSIS — C7951 Secondary malignant neoplasm of bone: Secondary | ICD-10-CM | POA: Insufficient documentation

## 2017-11-06 DIAGNOSIS — Z90710 Acquired absence of both cervix and uterus: Secondary | ICD-10-CM | POA: Diagnosis not present

## 2017-11-06 DIAGNOSIS — I130 Hypertensive heart and chronic kidney disease with heart failure and stage 1 through stage 4 chronic kidney disease, or unspecified chronic kidney disease: Secondary | ICD-10-CM

## 2017-11-06 DIAGNOSIS — Z90711 Acquired absence of uterus with remaining cervical stump: Secondary | ICD-10-CM | POA: Insufficient documentation

## 2017-11-06 DIAGNOSIS — Z8041 Family history of malignant neoplasm of ovary: Secondary | ICD-10-CM | POA: Diagnosis not present

## 2017-11-06 DIAGNOSIS — C771 Secondary and unspecified malignant neoplasm of intrathoracic lymph nodes: Secondary | ICD-10-CM | POA: Diagnosis not present

## 2017-11-06 DIAGNOSIS — Z794 Long term (current) use of insulin: Secondary | ICD-10-CM | POA: Insufficient documentation

## 2017-11-06 DIAGNOSIS — Z79811 Long term (current) use of aromatase inhibitors: Secondary | ICD-10-CM

## 2017-11-06 DIAGNOSIS — Z5111 Encounter for antineoplastic chemotherapy: Secondary | ICD-10-CM | POA: Insufficient documentation

## 2017-11-06 DIAGNOSIS — Z95828 Presence of other vascular implants and grafts: Secondary | ICD-10-CM

## 2017-11-06 LAB — COMPREHENSIVE METABOLIC PANEL
ALBUMIN: 3.9 g/dL (ref 3.5–5.0)
ALK PHOS: 82 U/L (ref 38–126)
ALT: 13 U/L — ABNORMAL LOW (ref 14–54)
AST: 18 U/L (ref 15–41)
Anion gap: 7 (ref 5–15)
BILIRUBIN TOTAL: 0.5 mg/dL (ref 0.3–1.2)
BUN: 48 mg/dL — AB (ref 6–20)
CALCIUM: 9.1 mg/dL (ref 8.9–10.3)
CO2: 21 mmol/L — ABNORMAL LOW (ref 22–32)
Chloride: 104 mmol/L (ref 101–111)
Creatinine, Ser: 3.78 mg/dL — ABNORMAL HIGH (ref 0.44–1.00)
GFR calc Af Amer: 14 mL/min — ABNORMAL LOW (ref >60)
GFR calc non Af Amer: 12 mL/min — ABNORMAL LOW (ref >60)
GLUCOSE: 181 mg/dL — AB (ref 65–99)
Potassium: 4.1 mmol/L (ref 3.5–5.1)
Sodium: 132 mmol/L — ABNORMAL LOW (ref 135–145)
TOTAL PROTEIN: 7.6 g/dL (ref 6.5–8.1)

## 2017-11-06 LAB — CBC WITH DIFFERENTIAL/PLATELET
BASOS ABS: 0.1 10*3/uL (ref 0–0.1)
BASOS PCT: 3 %
Eosinophils Absolute: 0 10*3/uL (ref 0–0.7)
Eosinophils Relative: 1 %
HEMATOCRIT: 25.6 % — AB (ref 35.0–47.0)
HEMOGLOBIN: 9.1 g/dL — AB (ref 12.0–16.0)
Lymphocytes Relative: 24 %
Lymphs Abs: 1 10*3/uL (ref 1.0–3.6)
MCH: 36 pg — ABNORMAL HIGH (ref 26.0–34.0)
MCHC: 35.6 g/dL (ref 32.0–36.0)
MCV: 101 fL — ABNORMAL HIGH (ref 80.0–100.0)
MONOS PCT: 14 %
Monocytes Absolute: 0.6 10*3/uL (ref 0.2–0.9)
NEUTROS ABS: 2.4 10*3/uL (ref 1.4–6.5)
NEUTROS PCT: 58 %
Platelets: 189 10*3/uL (ref 150–440)
RBC: 2.54 MIL/uL — ABNORMAL LOW (ref 3.80–5.20)
RDW: 13.8 % (ref 11.5–14.5)
WBC: 4.1 10*3/uL (ref 3.6–11.0)

## 2017-11-06 MED ORDER — HEPARIN SOD (PORK) LOCK FLUSH 100 UNIT/ML IV SOLN
500.0000 [IU] | INTRAVENOUS | Status: AC | PRN
  Administered 2017-11-06: 500 [IU]

## 2017-11-06 MED ORDER — SODIUM CHLORIDE 0.9% FLUSH
10.0000 mL | INTRAVENOUS | Status: AC | PRN
  Administered 2017-11-06: 10 mL
  Filled 2017-11-06: qty 10

## 2017-11-06 NOTE — Progress Notes (Signed)
START ON PATHWAY REGIMEN - Breast     A cycle is every 28 days (3 weeks on and 1 week off):     Paclitaxel   **Always confirm dose/schedule in your pharmacy ordering system**    Patient Characteristics: Distant Metastases or Locoregional Recurrent Disease - Unresected, HER2 Negative/Unknown/Equivocal, ER Positive, Chemotherapy, First Line Therapeutic Status: Distant Metastases BRCA Mutation Status: Awaiting Test Results ER Status: Positive (+) HER2 Status: Negative (-) PR Status: Positive (+) Line of therapy: First Line Intent of Therapy: Non-Curative / Palliative Intent, Discussed with Patient

## 2017-11-06 NOTE — Progress Notes (Signed)
[place] OFFICE PROGRESS NOTE  Patient Care Team: [name], [name]   SUMMARY OF ONCOLOGIC HISTORY:  Oncology History   # OCT 2015-STAGE IV LEFT BREAST T2N1 [T=4cm; [name]; [name]; [name]; EBUS- Positive Paratrac/subcarinal LN s/p ? Taxotere [in [place]; [name]; [name];[name]; [name]; suv 3]; [name]; [name]; [name];  [name]; Ant R iliac bone; [name]; [name]; START-Taxol weekly.   # Poorly controlled Blood sugars- improved.   # Pancreatitis Hx/ CKD IV [creat ~ 3-4]; [name]; mild left sided weakness]     Carcinoma of upper-inner quadrant of left breast in female, estrogen receptor positive ([place])    INTERVAL HISTORY:  A very pleasant 62 year old female patient with above history of metastatic left breast cancer currently on ibrance plus Femara is here for follow-up/review the results of the PET scan.  Patient denies any unusual shortness of breath or cough.  Denies any nausea vomiting or diarrhea.   Patient continues to deny any unusual weight gain or weight loss.  No swelling in the legs.  Follows up with nephrology closely.  Otherwise denies any blood in stools or black stools.  REVIEW OF SYSTEMS:  A complete 10 point review of system is done which is negative except mentioned above/history of present illness.   PAST MEDICAL HISTORY :  Past Medical History:  Diagnosis Date  . Asthma   . CHF (congestive heart failure) ([place])  1997  . CKD (chronic kidney disease)   . Depression   . Diabetes mellitus, type 2 ([place])   . Hair loss   . History of left breast cancer 05/29/14  . History of partial hysterectomy 12/31/2016   Per patient.  Has not had a period in years.  Had a partial hysterectomy years ago.  . Obesity   . Pancreatitis 1997  . Stroke [facility]) 2010   with mild left arm weakness    PAST SURGICAL HISTORY :   Past Surgical History:  Procedure Laterality Date  . CESAREAN SECTION    . CHOLECYSTECTOMY    . PARTIAL HYSTERECTOMY  12/31/2016   Per patient, she has not had a period in years since she had a partial hysterectomy.    FAMILY HISTORY :   Family History  Problem Relation Age of Onset  . Ovarian cancer Mother   . Diabetes Mother   . Hypertension Mother   . COPD Father   . Hypertension Father   . Diabetes Sister   . Diabetes Brother     SOCIAL HISTORY:   Social History   Tobacco Use  . Smoking status: Former Smoker    Packs/day: 0.50    Years: 1.00    Pack years: 0.50    Types: Cigarettes  . Smokeless tobacco: Never Used  Substance Use Topics  . Alcohol use: No    Alcohol/week: 0.0 oz  . Drug use: No    ALLERGIES:  has No Known Allergies.  MEDICATIONS:  Current Outpatient Medications  Medication Sig Dispense Refill  . acetaminophen-codeine (TYLENOL #4)  300-60 MG tablet Take 2 tablets by mouth every 6 (six) hours as needed for moderate pain.     Marland Kitchen albuterol (PROAIR HFA) 108 (90 BASE) MCG/ACT inhaler Inhale 1 puff into the lungs every 6 (six) hours as needed for shortness of breath.     Marland Kitchen albuterol (PROVENTIL) (2.5 MG/3ML) 0.083% nebulizer solution Inhale 2.5 mg into the lungs every 6 (six) hours as needed for shortness of breath.     . ALPRAZolam (XANAX) 0.5 MG tablet Take 0.5 mg by mouth at bedtime as needed for anxiety or sleep.     Marland Kitchen amLODipine (NORVASC) 10 MG tablet Take 10 mg by mouth daily.     Marland Kitchen aspirin EC 81 MG tablet Take 81 mg by mouth once.     Marland Kitchen atenolol (TENORMIN)  100 MG tablet TAKE 1 TABLETS BY MOUTH TWICE DAILY    . B-D ULTRA-FINE 33 LANCETS MISC Use 1 each 2 (two) times daily.    . B-D ULTRAFINE III SHORT PEN 31G X 8 MM MISC     . bumetanide (BUMEX) 0.5 MG tablet TAKE 1 TABLET BY MOUTH TWICE DAILY    . calcitRIOL (ROCALTROL) 0.25 MCG capsule Take 0.25 mcg by mouth 3 (three) times a week.    . Cinnamon 500 MG capsule Take 500 mg by mouth daily.     . cloNIDine (CATAPRES) 0.2 MG tablet Take 0.2 mg by mouth 2 (two) times daily.     . enalapril (VASOTEC) 20 MG tablet Take 20 mg by mouth 2 (two) times daily.     . ferrous sulfate 325 (65 FE) MG tablet Take 325 mg by mouth 2 (two) times daily with a meal.    . FLUoxetine (PROZAC) 20 MG capsule Take 20 mg by mouth 2 (two) times daily.     Marland Kitchen glucose blood (ONE TOUCH ULTRA TEST) test strip Use 1 each 2 (two) times daily. Use as instructed.    . glyBURIDE (DIABETA) 5 MG tablet Take 5 mg by mouth daily with breakfast.     . IBRANCE 125 MG capsule TAKE 1 CAPSULE BY MOUTH ONE TIME DAILY WITH BREAKFAST FOR 3 WEEKS ON FOLLOWED BY 1 WEEK OFF. TAKE WHOLE WITH FOOD. 21 capsule 6  . letrozole (FEMARA) 2.5 MG tablet TAKE 1 TABLET BY MOUTH DAILY 90 tablet 0  . LEVEMIR FLEXTOUCH 100 UNIT/ML Pen Inject 55 Units into the skin daily.     . metoprolol tartrate (LOPRESSOR) 25 MG tablet TAKE 1 TABLET BY MOUTH TWICE DAILY 180 tablet 3  . mometasone (NASONEX) 50 MCG/ACT nasal spray Place 2 sprays into the nose daily as needed.     Marland Kitchen NOVOLOG FLEXPEN 100 UNIT/ML FlexPen Inject 7 Units into the skin 2 (two) times daily at 8 am and 10 pm.     . salmeterol (SEREVENT) 50 MCG/DOSE diskus inhaler Inhale 1 puff into the lungs 2 (two) times daily.    . simvastatin (ZOCOR) 20 MG tablet Take 20 mg by mouth daily at 6 PM.     . vitamin B-12 (CYANOCOBALAMIN) 1000 MCG tablet Take 1,000 mcg by mouth daily.     No current facility-administered medications for this visit.    Facility-Administered Medications Ordered in Other Visits  Medication  Dose Route Frequency Provider Last Rate Last Dose  . sodium chloride flush (NS) 0.9 % injection 10 mL  10 mL Intravenous PRN Cammie Sickle, MD   10 mL at 01/30/16 1054    PHYSICAL EXAMINATION: ECOG PERFORMANCE STATUS: 0 -  Asymptomatic  BP (!) 146/83 (Patient Position: Sitting)   Pulse 71   Temp 97.6 F (36.4 C) (Tympanic)   Resp 20   Ht 5\' 2"  (1.575 m)   Wt 189 lb (85.7 kg)   BMI 34.57 kg/m   Filed Weights   11/06/17 1041  Weight: 189 lb (85.7 kg)    GENERAL: Well-nourished well-developed; Alert, no distress and comfortable.  She is alone.  EYES: no pallor or icterus OROPHARYNX: no thrush or ulceration; good dentition  NECK: supple, no masses felt LYMPH:  no palpable lymphadenopathy in the cervical, axillary or inguinal regions LUNGS: clear to auscultation and  No wheeze or crackles HEART/CVS: regular rate & rhythm and no murmurs; No lower extremity edema ABDOMEN:abdomen soft, non-tender and normal bowel sounds Musculoskeletal:no cyanosis of digits and no clubbing  PSYCH: alert & oriented x 3 with fluent speech NEURO: no focal motor/sensory deficits SKIN:  No rash.     LABORATORY DATA:  I have reviewed the data as listed    Component Value Date/Time   NA 132 (L) 11/06/2017 1026   NA 130 (L) 06/06/2014 1102   K 4.1 11/06/2017 1026   K 3.9 06/06/2014 1102   CL 104 11/06/2017 1026   CL 95 (L) 06/06/2014 1102   CO2 21 (L) 11/06/2017 1026   CO2 28 06/06/2014 1102   GLUCOSE 181 (H) 11/06/2017 1026   GLUCOSE 349 (H) 06/06/2014 1102   BUN 48 (H) 11/06/2017 1026   BUN 17 06/06/2014 1102   CREATININE 3.78 (H) 11/06/2017 1026   CREATININE 1.63 (H) 06/06/2014 1102   CALCIUM 9.1 11/06/2017 1026   CALCIUM 9.2 06/06/2014 1102   PROT 7.6 11/06/2017 1026   PROT 8.2 06/06/2014 1102   ALBUMIN 3.9 11/06/2017 1026   ALBUMIN 3.3 (L) 06/06/2014 1102   AST 18 11/06/2017 1026   AST 7 (L) 06/06/2014 1102   ALT 13 (L) 11/06/2017 1026   ALT 12 (L) 06/06/2014 1102    ALKPHOS 82 11/06/2017 1026   ALKPHOS 74 06/06/2014 1102   BILITOT 0.5 11/06/2017 1026   BILITOT 0.4 06/06/2014 1102   GFRNONAA 12 (L) 11/06/2017 1026   GFRNONAA 35 (L) 06/06/2014 1102   GFRAA 14 (L) 11/06/2017 1026   GFRAA 42 (L) 06/06/2014 1102    No results found for: SPEP, UPEP  Lab Results  Component Value Date   WBC 4.1 11/06/2017   NEUTROABS 2.4 11/06/2017   HGB 9.1 (L) 11/06/2017   HCT 25.6 (L) 11/06/2017   MCV 101.0 (H) 11/06/2017   PLT 189 11/06/2017      Chemistry      Component Value Date/Time   NA 132 (L) 11/06/2017 1026   NA 130 (L) 06/06/2014 1102   K 4.1 11/06/2017 1026   K 3.9 06/06/2014 1102   CL 104 11/06/2017 1026   CL 95 (L) 06/06/2014 1102   CO2 21 (L) 11/06/2017 1026   CO2 28 06/06/2014 1102   BUN 48 (H) 11/06/2017 1026   BUN 17 06/06/2014 1102   CREATININE 3.78 (H) 11/06/2017 1026   CREATININE 1.63 (H) 06/06/2014 1102      Component Value Date/Time   CALCIUM 9.1 11/06/2017 1026   CALCIUM 9.2 06/06/2014 1102   ALKPHOS 82 11/06/2017 1026   ALKPHOS 74 06/06/2014 1102   AST 18 11/06/2017 1026   AST 7 (L) 06/06/2014 1102   ALT 13 (L) 11/06/2017 1026   ALT 12 (L) 06/06/2014 1102   BILITOT 0.5 11/06/2017 1026   BILITOT 0.4 06/06/2014  1102      IMPRESSION: 1. Progressive malignancy, with enlarging pleural-based masses on the left side which are mildly but abnormally hypermetabolic, and progression of the lytic right iliac bone lesion which now has a more permeative appearance. 2. Subtle foci of activity at the right eighth and tenth rib costotransverse articulations, probably benign but meriting surveillance. 3. A pleural-based nodule along the right middle lobe is stable, as is mild nodularity along the major fissure on the right, and neither are appreciably hypermetabolic at this time. 4. Other imaging findings of potential clinical significance: Right-sided sinusitis with complete opacification of the right frontal and maxillary  sinuses, and evidence of chronic right ethmoid and sphenoid sinusitis. Mild-to-moderate cardiomegaly. Aortic Atherosclerosis (ICD10-I70.0). Non hypermetabolic sclerotic lesions in the sternum and multiple thoracic vertebral bodies compatible with inactive prior metastatic lesions.   Electronically Signed   By: Van Clines M.D.   On: 11/02/2017 10:50    Results for JAZSMIN, COUSE (MRN [id number]) as of 11/06/2017 10:22  Ref. Range 11/21/2016 09:44 12/19/2016 13:05 02/02/2017 09:15 03/09/2017 10:55 04/09/2017 10:04 05/07/2017 09:34 06/04/2017 10:16 07/09/2017 10:29 08/07/2017 09:35 09/04/2017 09:50 10/02/2017 09:08  CA 27.29 Latest Ref Range: 0.0 - 38.6 U/mL 67.3 (H) 70.7 (H) 74.9 (H)          CA 27.29 Latest Ref Range: 0.0 - 38.6 U/mL    89.6 (H) 92.2 (H) 98.0 (H) 109.7 (H) 114.0 (H) 105.9 (H) 106.3 (H) 100.4 (H)     ASSESSMENT & PLAN:  Carcinoma of upper-inner quadrant of left breast in female, estrogen receptor positive ([place]) # Left breast cancer stage IV/oligometastatic to the mediastinal lymph nodes currently  with ibrance and Femara since March 2016; unfortunately April 2019- PET- progressive of pleural based mets/rising TM  #Recommend discontinuation of Femara plus Ibrance.  Recommend starting Taxol weekly chemotherapy; day 15 off; every 28-day cycle.   Discussed the potential side effects including but not limited to-increasing fatigue, nausea vomiting, diarrhea, hair loss, sores in the mouth, increase risk of infection and also neuropathy.  Patient understands that treatments are palliative/not curative.  #Elevated blood pressure-recommend close monitoring at home; followed closely by Nephrology.   #Diabetes on insulin; recommend checking blood sugars closely around the time of chemotherapy-as premedications include steroids/rise blood sugars.  # Anemia/ IDA-CKD- Hb 8.9/ recommend compliance with PO iron.   # CKD Stage IV- creat 3.5/STABLE; but  No dialysis yet. Monitored closely  nephrology. Will inform Dr.Kolluru.   # Taxol; no labs; weekly cbc/taxol; 26th cbc/bmp/Taxol.   # I reviewed the blood work- with the patient in detail; also reviewed the imaging independently [as summarized above]; and with the patient in detail.      Cammie Sickle, MD 11/08/2017 5:52 PM

## 2017-11-06 NOTE — Assessment & Plan Note (Addendum)
#   Left breast cancer stage IV/oligometastatic to the mediastinal lymph nodes currently  with ibrance and Femara since March 2016; unfortunately April 2019- PET- progressive of pleural based mets/rising TM  #Recommend discontinuation of Femara plus Ibrance.  Recommend starting Taxol weekly chemotherapy; day 15 off; every 28-day cycle.   Discussed the potential side effects including but not limited to-increasing fatigue, nausea vomiting, diarrhea, hair loss, sores in the mouth, increase risk of infection and also neuropathy.  Patient understands that treatments are palliative/not curative.  #Elevated blood pressure-recommend close monitoring at home; followed closely by Nephrology.   #Diabetes on insulin; recommend checking blood sugars closely around the time of chemotherapy-as premedications include steroids/rise blood sugars.  # Anemia/ IDA-CKD- Hb 8.9/ recommend compliance with PO iron.   # CKD Stage IV- creat 3.5/STABLE; but  No dialysis yet. Monitored closely nephrology. Will inform Dr.Kolluru.   # Taxol; no labs; weekly cbc/taxol; 26th cbc/bmp/Taxol.   # I reviewed the blood work- with the patient in detail; also reviewed the imaging independently [as summarized above]; and with the patient in detail.

## 2017-11-06 NOTE — Progress Notes (Signed)
Patient on plan of care prior to pathways. 

## 2017-11-07 LAB — CANCER ANTIGEN 27.29: CA 27.29: 115 U/mL — ABNORMAL HIGH (ref 0.0–38.6)

## 2017-11-08 ENCOUNTER — Telehealth: Payer: Self-pay | Admitting: Internal Medicine

## 2017-11-08 MED ORDER — PROCHLORPERAZINE MALEATE 10 MG PO TABS: 10.0000 mg | ORAL_TABLET | Freq: Four times a day (QID) | ORAL | 1 refills | Status: DC | PRN

## 2017-11-08 MED ORDER — ONDANSETRON HCL 8 MG PO TABS: ORAL_TABLET | ORAL | 1 refills | Status: DC

## 2017-11-08 NOTE — Telephone Encounter (Signed)
Please inform patient that I have sent prescription for her antiemetics Zofran/and Compazine to her pharmacy.

## 2017-11-09 ENCOUNTER — Other Ambulatory Visit: Payer: Self-pay | Admitting: Internal Medicine

## 2017-11-09 NOTE — Telephone Encounter (Signed)
Patient notified that these prescriptions have been sent to pharmacy. Patient verbalized understanding.

## 2017-11-13 ENCOUNTER — Inpatient Hospital Stay: Payer: Medicare Other

## 2017-11-13 VITALS — BP 130/80 | HR 74 | Temp 97.0°F | Resp 18

## 2017-11-13 DIAGNOSIS — C7951 Secondary malignant neoplasm of bone: Secondary | ICD-10-CM | POA: Diagnosis not present

## 2017-11-13 DIAGNOSIS — C50212 Malignant neoplasm of upper-inner quadrant of left female breast: Secondary | ICD-10-CM | POA: Diagnosis not present

## 2017-11-13 DIAGNOSIS — Z170 Estrogen receptor positive status [ER+]: Secondary | ICD-10-CM | POA: Diagnosis not present

## 2017-11-13 DIAGNOSIS — Z5111 Encounter for antineoplastic chemotherapy: Secondary | ICD-10-CM | POA: Diagnosis not present

## 2017-11-13 DIAGNOSIS — C771 Secondary and unspecified malignant neoplasm of intrathoracic lymph nodes: Secondary | ICD-10-CM | POA: Diagnosis not present

## 2017-11-13 DIAGNOSIS — C782 Secondary malignant neoplasm of pleura: Secondary | ICD-10-CM | POA: Diagnosis not present

## 2017-11-13 MED ORDER — DEXAMETHASONE SODIUM PHOSPHATE 10 MG/ML IJ SOLN
8.0000 mg | Freq: Once | INTRAMUSCULAR | Status: AC
  Administered 2017-11-13: 8 mg via INTRAVENOUS
  Filled 2017-11-13: qty 1

## 2017-11-13 MED ORDER — DIPHENHYDRAMINE HCL 50 MG/ML IJ SOLN
50.0000 mg | Freq: Once | INTRAMUSCULAR | Status: AC
  Administered 2017-11-13: 50 mg via INTRAVENOUS
  Filled 2017-11-13: qty 1

## 2017-11-13 MED ORDER — FAMOTIDINE IN NACL 20-0.9 MG/50ML-% IV SOLN
20.0000 mg | Freq: Once | INTRAVENOUS | Status: AC
  Administered 2017-11-13: 20 mg via INTRAVENOUS
  Filled 2017-11-13: qty 50

## 2017-11-13 MED ORDER — HEPARIN SOD (PORK) LOCK FLUSH 100 UNIT/ML IV SOLN
500.0000 [IU] | Freq: Once | INTRAVENOUS | Status: AC | PRN
  Administered 2017-11-13: 500 [IU]
  Filled 2017-11-13: qty 5

## 2017-11-13 MED ORDER — SODIUM CHLORIDE 0.9 % IV SOLN: 8.0000 mg | Freq: Once | INTRAVENOUS | Status: DC

## 2017-11-13 MED ORDER — SODIUM CHLORIDE 0.9 % IV SOLN
Freq: Once | INTRAVENOUS | Status: AC
  Administered 2017-11-13: 09:00:00 via INTRAVENOUS
  Filled 2017-11-13: qty 1000

## 2017-11-13 MED ORDER — SODIUM CHLORIDE 0.9% FLUSH
10.0000 mL | INTRAVENOUS | Status: DC | PRN
  Administered 2017-11-13: 10 mL
  Filled 2017-11-13: qty 10

## 2017-11-13 MED ORDER — SODIUM CHLORIDE 0.9 % IV SOLN
80.0000 mg/m2 | Freq: Once | INTRAVENOUS | Status: AC
  Administered 2017-11-13: 156 mg via INTRAVENOUS
  Filled 2017-11-13: qty 26

## 2017-11-13 NOTE — Progress Notes (Signed)
Per MD, Dr. Rogue Bussing, order: Labs from 11/06/17 are being referenced for today's Taxol treatment. MD notified and already aware of Creatinine: 3.78 on 11/06/17. No lab work needed today, proceed with Taxol treatment at this time.

## 2017-11-19 DIAGNOSIS — N2581 Secondary hyperparathyroidism of renal origin: Secondary | ICD-10-CM | POA: Diagnosis not present

## 2017-11-19 DIAGNOSIS — N184 Chronic kidney disease, stage 4 (severe): Secondary | ICD-10-CM | POA: Diagnosis not present

## 2017-11-19 DIAGNOSIS — I129 Hypertensive chronic kidney disease with stage 1 through stage 4 chronic kidney disease, or unspecified chronic kidney disease: Secondary | ICD-10-CM | POA: Diagnosis not present

## 2017-11-19 DIAGNOSIS — E1122 Type 2 diabetes mellitus with diabetic chronic kidney disease: Secondary | ICD-10-CM | POA: Diagnosis not present

## 2017-11-19 DIAGNOSIS — D631 Anemia in chronic kidney disease: Secondary | ICD-10-CM | POA: Diagnosis not present

## 2017-11-20 ENCOUNTER — Inpatient Hospital Stay: Payer: Medicare Other

## 2017-11-20 VITALS — BP 154/83 | HR 70 | Temp 96.8°F | Resp 20 | Wt 193.2 lb

## 2017-11-20 DIAGNOSIS — Z170 Estrogen receptor positive status [ER+]: Secondary | ICD-10-CM | POA: Diagnosis not present

## 2017-11-20 DIAGNOSIS — C771 Secondary and unspecified malignant neoplasm of intrathoracic lymph nodes: Secondary | ICD-10-CM | POA: Diagnosis not present

## 2017-11-20 DIAGNOSIS — C50212 Malignant neoplasm of upper-inner quadrant of left female breast: Secondary | ICD-10-CM | POA: Diagnosis not present

## 2017-11-20 DIAGNOSIS — C782 Secondary malignant neoplasm of pleura: Secondary | ICD-10-CM | POA: Diagnosis not present

## 2017-11-20 DIAGNOSIS — Z5111 Encounter for antineoplastic chemotherapy: Secondary | ICD-10-CM | POA: Diagnosis not present

## 2017-11-20 DIAGNOSIS — C7951 Secondary malignant neoplasm of bone: Secondary | ICD-10-CM | POA: Diagnosis not present

## 2017-11-20 LAB — COMPREHENSIVE METABOLIC PANEL
ALBUMIN: 3.8 g/dL (ref 3.5–5.0)
ALK PHOS: 73 U/L (ref 38–126)
ALT: 18 U/L (ref 14–54)
AST: 19 U/L (ref 15–41)
Anion gap: 8 (ref 5–15)
BILIRUBIN TOTAL: 0.6 mg/dL (ref 0.3–1.2)
BUN: 39 mg/dL — ABNORMAL HIGH (ref 6–20)
CALCIUM: 9 mg/dL (ref 8.9–10.3)
CO2: 23 mmol/L (ref 22–32)
Chloride: 104 mmol/L (ref 101–111)
Creatinine, Ser: 3.1 mg/dL — ABNORMAL HIGH (ref 0.44–1.00)
GFR calc non Af Amer: 15 mL/min — ABNORMAL LOW (ref >60)
GFR, EST AFRICAN AMERICAN: 18 mL/min — AB (ref >60)
Glucose, Bld: 123 mg/dL — ABNORMAL HIGH (ref 65–99)
POTASSIUM: 4.3 mmol/L (ref 3.5–5.1)
Sodium: 135 mmol/L (ref 135–145)
TOTAL PROTEIN: 7.5 g/dL (ref 6.5–8.1)

## 2017-11-20 LAB — CBC WITH DIFFERENTIAL/PLATELET
BASOS ABS: 0.1 10*3/uL (ref 0–0.1)
BASOS PCT: 2 %
Eosinophils Absolute: 0.1 10*3/uL (ref 0–0.7)
Eosinophils Relative: 2 %
HEMATOCRIT: 26.4 % — AB (ref 35.0–47.0)
HEMOGLOBIN: 9.1 g/dL — AB (ref 12.0–16.0)
LYMPHS PCT: 20 %
Lymphs Abs: 0.9 10*3/uL — ABNORMAL LOW (ref 1.0–3.6)
MCH: 35 pg — ABNORMAL HIGH (ref 26.0–34.0)
MCHC: 34.5 g/dL (ref 32.0–36.0)
MCV: 101.7 fL — AB (ref 80.0–100.0)
Monocytes Absolute: 0.4 10*3/uL (ref 0.2–0.9)
Monocytes Relative: 8 %
NEUTROS ABS: 3.2 10*3/uL (ref 1.4–6.5)
NEUTROS PCT: 68 %
Platelets: 424 10*3/uL (ref 150–440)
RBC: 2.6 MIL/uL — AB (ref 3.80–5.20)
RDW: 13.6 % (ref 11.5–14.5)
WBC: 4.7 10*3/uL (ref 3.6–11.0)

## 2017-11-20 MED ORDER — DIPHENHYDRAMINE HCL 50 MG/ML IJ SOLN
50.0000 mg | Freq: Once | INTRAMUSCULAR | Status: AC
  Administered 2017-11-20: 50 mg via INTRAVENOUS
  Filled 2017-11-20: qty 1

## 2017-11-20 MED ORDER — SODIUM CHLORIDE 0.9 % IV SOLN
80.0000 mg/m2 | Freq: Once | INTRAVENOUS | Status: AC
  Administered 2017-11-20: 156 mg via INTRAVENOUS
  Filled 2017-11-20: qty 26

## 2017-11-20 MED ORDER — SODIUM CHLORIDE 0.9 % IV SOLN: 8.0000 mg | Freq: Once | INTRAVENOUS | Status: DC

## 2017-11-20 MED ORDER — DEXAMETHASONE SODIUM PHOSPHATE 10 MG/ML IJ SOLN
8.0000 mg | Freq: Once | INTRAMUSCULAR | Status: AC
  Administered 2017-11-20: 8 mg via INTRAVENOUS
  Filled 2017-11-20: qty 1

## 2017-11-20 MED ORDER — SODIUM CHLORIDE 0.9 % IV SOLN
Freq: Once | INTRAVENOUS | Status: AC
  Administered 2017-11-20: 10:00:00 via INTRAVENOUS
  Filled 2017-11-20: qty 1000

## 2017-11-20 MED ORDER — HEPARIN SOD (PORK) LOCK FLUSH 100 UNIT/ML IV SOLN
500.0000 [IU] | Freq: Once | INTRAVENOUS | Status: AC | PRN
  Administered 2017-11-20: 500 [IU]
  Filled 2017-11-20: qty 5

## 2017-11-20 MED ORDER — SODIUM CHLORIDE 0.9% FLUSH
10.0000 mL | INTRAVENOUS | Status: DC | PRN
  Administered 2017-11-20: 10 mL
  Filled 2017-11-20: qty 10

## 2017-11-20 MED ORDER — FAMOTIDINE IN NACL 20-0.9 MG/50ML-% IV SOLN
20.0000 mg | Freq: Once | INTRAVENOUS | Status: AC
  Administered 2017-11-20: 20 mg via INTRAVENOUS
  Filled 2017-11-20: qty 50

## 2017-11-20 NOTE — Progress Notes (Signed)
Creatinine: 3.1. On call MD, Dr. Janese Banks, notified and aware. Per MD order: proceed with scheduled Taxol treatment today.

## 2017-11-27 ENCOUNTER — Inpatient Hospital Stay: Payer: Medicare Other

## 2017-11-27 ENCOUNTER — Inpatient Hospital Stay (HOSPITAL_BASED_OUTPATIENT_CLINIC_OR_DEPARTMENT_OTHER): Payer: Medicare Other | Admitting: Internal Medicine

## 2017-11-27 ENCOUNTER — Other Ambulatory Visit: Payer: Self-pay

## 2017-11-27 ENCOUNTER — Encounter: Payer: Self-pay | Admitting: Internal Medicine

## 2017-11-27 VITALS — BP 154/83 | HR 74 | Temp 97.9°F | Resp 20 | Ht 62.0 in | Wt 194.0 lb

## 2017-11-27 DIAGNOSIS — C50212 Malignant neoplasm of upper-inner quadrant of left female breast: Secondary | ICD-10-CM | POA: Diagnosis not present

## 2017-11-27 DIAGNOSIS — I130 Hypertensive heart and chronic kidney disease with heart failure and stage 1 through stage 4 chronic kidney disease, or unspecified chronic kidney disease: Secondary | ICD-10-CM

## 2017-11-27 DIAGNOSIS — N184 Chronic kidney disease, stage 4 (severe): Secondary | ICD-10-CM

## 2017-11-27 DIAGNOSIS — Z7951 Long term (current) use of inhaled steroids: Secondary | ICD-10-CM

## 2017-11-27 DIAGNOSIS — C771 Secondary and unspecified malignant neoplasm of intrathoracic lymph nodes: Secondary | ICD-10-CM

## 2017-11-27 DIAGNOSIS — I509 Heart failure, unspecified: Secondary | ICD-10-CM

## 2017-11-27 DIAGNOSIS — I700 Atherosclerosis of aorta: Secondary | ICD-10-CM

## 2017-11-27 DIAGNOSIS — E1122 Type 2 diabetes mellitus with diabetic chronic kidney disease: Secondary | ICD-10-CM | POA: Diagnosis not present

## 2017-11-27 DIAGNOSIS — Z79811 Long term (current) use of aromatase inhibitors: Secondary | ICD-10-CM

## 2017-11-27 DIAGNOSIS — C7951 Secondary malignant neoplasm of bone: Secondary | ICD-10-CM | POA: Diagnosis not present

## 2017-11-27 DIAGNOSIS — Z170 Estrogen receptor positive status [ER+]: Principal | ICD-10-CM

## 2017-11-27 DIAGNOSIS — I252 Old myocardial infarction: Secondary | ICD-10-CM

## 2017-11-27 DIAGNOSIS — D631 Anemia in chronic kidney disease: Secondary | ICD-10-CM | POA: Diagnosis not present

## 2017-11-27 DIAGNOSIS — Z794 Long term (current) use of insulin: Secondary | ICD-10-CM

## 2017-11-27 DIAGNOSIS — Z5111 Encounter for antineoplastic chemotherapy: Secondary | ICD-10-CM | POA: Diagnosis not present

## 2017-11-27 DIAGNOSIS — C782 Secondary malignant neoplasm of pleura: Secondary | ICD-10-CM

## 2017-11-27 DIAGNOSIS — D509 Iron deficiency anemia, unspecified: Secondary | ICD-10-CM

## 2017-11-27 DIAGNOSIS — Z8673 Personal history of transient ischemic attack (TIA), and cerebral infarction without residual deficits: Secondary | ICD-10-CM

## 2017-11-27 DIAGNOSIS — J45909 Unspecified asthma, uncomplicated: Secondary | ICD-10-CM

## 2017-11-27 DIAGNOSIS — Z7982 Long term (current) use of aspirin: Secondary | ICD-10-CM

## 2017-11-27 DIAGNOSIS — Z79899 Other long term (current) drug therapy: Secondary | ICD-10-CM

## 2017-11-27 LAB — COMPREHENSIVE METABOLIC PANEL
ALBUMIN: 3.7 g/dL (ref 3.5–5.0)
ALT: 21 U/L (ref 14–54)
AST: 17 U/L (ref 15–41)
Alkaline Phosphatase: 69 U/L (ref 38–126)
Anion gap: 9 (ref 5–15)
BUN: 38 mg/dL — AB (ref 6–20)
CHLORIDE: 105 mmol/L (ref 101–111)
CO2: 21 mmol/L — ABNORMAL LOW (ref 22–32)
CREATININE: 3.09 mg/dL — AB (ref 0.44–1.00)
Calcium: 8.7 mg/dL — ABNORMAL LOW (ref 8.9–10.3)
GFR calc Af Amer: 18 mL/min — ABNORMAL LOW (ref >60)
GFR calc non Af Amer: 15 mL/min — ABNORMAL LOW (ref >60)
GLUCOSE: 84 mg/dL (ref 65–99)
POTASSIUM: 4.1 mmol/L (ref 3.5–5.1)
Sodium: 135 mmol/L (ref 135–145)
Total Bilirubin: 0.6 mg/dL (ref 0.3–1.2)
Total Protein: 7.4 g/dL (ref 6.5–8.1)

## 2017-11-27 LAB — CBC WITH DIFFERENTIAL/PLATELET
Basophils Absolute: 0.1 10*3/uL (ref 0–0.1)
Basophils Relative: 2 %
EOS PCT: 2 %
Eosinophils Absolute: 0.1 10*3/uL (ref 0–0.7)
HCT: 26.3 % — ABNORMAL LOW (ref 35.0–47.0)
Hemoglobin: 9.3 g/dL — ABNORMAL LOW (ref 12.0–16.0)
LYMPHS ABS: 0.8 10*3/uL — AB (ref 1.0–3.6)
LYMPHS PCT: 21 %
MCH: 35.5 pg — AB (ref 26.0–34.0)
MCHC: 35.3 g/dL (ref 32.0–36.0)
MCV: 100.5 fL — AB (ref 80.0–100.0)
MONO ABS: 0.4 10*3/uL (ref 0.2–0.9)
Monocytes Relative: 11 %
Neutro Abs: 2.6 10*3/uL (ref 1.4–6.5)
Neutrophils Relative %: 64 %
PLATELETS: 352 10*3/uL (ref 150–440)
RBC: 2.61 MIL/uL — ABNORMAL LOW (ref 3.80–5.20)
RDW: 14 % (ref 11.5–14.5)
WBC: 4 10*3/uL (ref 3.6–11.0)

## 2017-11-27 MED ORDER — SODIUM CHLORIDE 0.9 % IV SOLN
80.0000 mg/m2 | Freq: Once | INTRAVENOUS | Status: AC
  Administered 2017-11-27: 156 mg via INTRAVENOUS
  Filled 2017-11-27: qty 26

## 2017-11-27 MED ORDER — FAMOTIDINE IN NACL 20-0.9 MG/50ML-% IV SOLN
20.0000 mg | Freq: Once | INTRAVENOUS | Status: AC
  Administered 2017-11-27: 20 mg via INTRAVENOUS
  Filled 2017-11-27: qty 50

## 2017-11-27 MED ORDER — DEXAMETHASONE SODIUM PHOSPHATE 10 MG/ML IJ SOLN
8.0000 mg | Freq: Once | INTRAMUSCULAR | Status: AC
  Administered 2017-11-27: 8 mg via INTRAVENOUS
  Filled 2017-11-27: qty 1

## 2017-11-27 MED ORDER — SODIUM CHLORIDE 0.9 % IV SOLN
Freq: Once | INTRAVENOUS | Status: AC
Start: 2017-11-27 — End: 2017-11-27
  Administered 2017-11-27: 10:00:00 via INTRAVENOUS
  Filled 2017-11-27: qty 1000

## 2017-11-27 MED ORDER — DIPHENHYDRAMINE HCL 50 MG/ML IJ SOLN
50.0000 mg | Freq: Once | INTRAMUSCULAR | Status: AC
  Administered 2017-11-27: 50 mg via INTRAVENOUS
  Filled 2017-11-27: qty 1

## 2017-11-27 MED ORDER — SODIUM CHLORIDE 0.9 % IV SOLN: 8.0000 mg | Freq: Once | INTRAVENOUS | Status: DC

## 2017-11-27 MED ORDER — HEPARIN SOD (PORK) LOCK FLUSH 100 UNIT/ML IV SOLN
500.0000 [IU] | Freq: Once | INTRAVENOUS | Status: AC | PRN
  Administered 2017-11-27: 500 [IU]

## 2017-11-27 NOTE — Progress Notes (Signed)
[facility] OFFICE PROGRESS NOTE  Patient Care Team: [name], [name]   SUMMARY OF ONCOLOGIC HISTORY:  Oncology History   # OCT 2015-STAGE IV LEFT BREAST T2N1 [T=4cm; [name]; [name]; [name]; EBUS- Positive Paratrac/subcarinal LN s/p ? Taxotere [in [place]; [name]; [name];[name]; [name]; suv 3]; [name]; [name]; [name];  [name]; Ant R iliac bone; [name]; [name]; START-Taxol weekly.   # Poorly controlled Blood sugars- improved.   # Pancreatitis Hx/ CKD IV [creat ~ 3-4]; [name]; mild left sided weakness]     Carcinoma of upper-inner quadrant of left breast in female, estrogen receptor positive ([place])    INTERVAL HISTORY:  A very pleasant 62 year old female patient with above history of metastatic left breast cancer currently weekly Taxol is here for follow-up.  In the interim patient was evaluated by nephrology.  No immediate plans for dialysis.  Patient denies any unusual shortness of breath or cough.  Denies any nausea vomiting or diarrhea.  Patient denies any tingling or numbness.  Appetite is fair.  Denies any pain.  Patient continues to deny any unusual weight gain or weight loss.  No swelling in the legs.   REVIEW OF SYSTEMS:  A complete 10 point review of system is done which is negative except mentioned above/history of present illness.   PAST MEDICAL HISTORY :  Past Medical History:  Diagnosis Date  . Asthma   . CHF  (congestive heart failure) ([place]) 1997  . CKD (chronic kidney disease)   . Depression   . Diabetes mellitus, type 2 ([place])   . Hair loss   . History of left breast cancer 05/29/14  . History of partial hysterectomy 12/31/2016   Per patient.  Has not had a period in years.  Had a partial hysterectomy years ago.  . Obesity   . Pancreatitis 1997  . Stroke [facility]) 2010   with mild left arm weakness    PAST SURGICAL HISTORY :   Past Surgical History:  Procedure Laterality Date  . CESAREAN SECTION    . CHOLECYSTECTOMY    . PARTIAL HYSTERECTOMY  12/31/2016   Per patient, she has not had a period in years since she had a partial hysterectomy.    FAMILY HISTORY :   Family History  Problem Relation Age of Onset  . Ovarian cancer Mother   . Diabetes Mother   . Hypertension Mother   . COPD Father   . Hypertension Father   . Diabetes Sister   . Diabetes Brother     SOCIAL HISTORY:   Social History   Tobacco Use  . Smoking status: Former Smoker    Packs/day: 0.50    Years: 1.00    Pack years: 0.50    Types: Cigarettes  . Smokeless tobacco: Never Used  Substance Use Topics  . Alcohol use: No    Alcohol/week: 0.0 oz  . Drug use: No    ALLERGIES:  has No Known Allergies.  MEDICATIONS:  Current Outpatient Medications  Medication Sig Dispense  Refill  . acetaminophen-codeine (TYLENOL #4) 300-60 MG tablet Take 2 tablets by mouth every 6 (six) hours as needed for moderate pain.     Marland Kitchen albuterol (PROAIR HFA) 108 (90 BASE) MCG/ACT inhaler Inhale 1 puff into the lungs every 6 (six) hours as needed for shortness of breath.     Marland Kitchen albuterol (PROVENTIL) (2.5 MG/3ML) 0.083% nebulizer solution Inhale 2.5 mg into the lungs every 6 (six) hours as needed for shortness of breath.     . ALPRAZolam (XANAX) 0.5 MG tablet Take 0.5 mg by mouth at bedtime as needed for anxiety or sleep.     Marland Kitchen amLODipine (NORVASC) 10 MG tablet Take 10 mg by mouth daily.     Marland Kitchen aspirin EC 81 MG tablet Take 81 mg by mouth  once.     Marland Kitchen atenolol (TENORMIN) 100 MG tablet TAKE 1 TABLETS BY MOUTH TWICE DAILY    . B-D ULTRA-FINE 33 LANCETS MISC Use 1 each 2 (two) times daily.    . B-D ULTRAFINE III SHORT PEN 31G X 8 MM MISC     . bumetanide (BUMEX) 0.5 MG tablet TAKE 1 TABLET BY MOUTH TWICE DAILY    . calcitRIOL (ROCALTROL) 0.25 MCG capsule Take 0.25 mcg by mouth 3 (three) times a week.    . Cinnamon 500 MG capsule Take 500 mg by mouth daily.     . cloNIDine (CATAPRES) 0.2 MG tablet Take 0.2 mg by mouth 2 (two) times daily.     . enalapril (VASOTEC) 20 MG tablet Take 20 mg by mouth 2 (two) times daily.     . ferrous sulfate 325 (65 FE) MG tablet Take 325 mg by mouth 2 (two) times daily with a meal.    . FLUoxetine (PROZAC) 20 MG capsule Take 20 mg by mouth 2 (two) times daily.     Marland Kitchen glucose blood (ONE TOUCH ULTRA TEST) test strip Use 1 each 2 (two) times daily. Use as instructed.    . glyBURIDE (DIABETA) 5 MG tablet Take 5 mg by mouth daily with breakfast.     . LEVEMIR FLEXTOUCH 100 UNIT/ML Pen Inject 55 Units into the skin daily.     . metoprolol tartrate (LOPRESSOR) 25 MG tablet TAKE 1 TABLET BY MOUTH TWICE DAILY 180 tablet 3  . mometasone (NASONEX) 50 MCG/ACT nasal spray Place 2 sprays into the nose daily as needed.     Marland Kitchen NOVOLOG FLEXPEN 100 UNIT/ML FlexPen Inject 7 Units into the skin 2 (two) times daily at 8 am and 10 pm.     . ondansetron (ZOFRAN) 8 MG tablet One pill every 8 hours as needed for nausea/vomitting. 40 tablet 1  . prochlorperazine (COMPAZINE) 10 MG tablet Take 1 tablet (10 mg total) by mouth every 6 (six) hours as needed for nausea or vomiting. 40 tablet 1  . salmeterol (SEREVENT) 50 MCG/DOSE diskus inhaler Inhale 1 puff into the lungs 2 (two) times daily.    . simvastatin (ZOCOR) 20 MG tablet Take 20 mg by mouth daily at 6 PM.     . vitamin B-12 (CYANOCOBALAMIN) 1000 MCG tablet Take 1,000 mcg by mouth daily.     No current facility-administered medications for this visit.     Facility-Administered Medications Ordered in Other Visits  Medication Dose Route Frequency Provider Last Rate Last Dose  . sodium chloride flush (NS) 0.9 % injection 10 mL  10 mL Intravenous PRN Cammie Sickle, MD   10 mL at 01/30/16 1054    PHYSICAL EXAMINATION:  ECOG PERFORMANCE STATUS: 0 - Asymptomatic  BP (!) 154/83   Pulse 74   Temp 97.9 F (36.6 C) (Tympanic)   Resp 20   Ht 5\' 2"  (1.575 m)   Wt 194 lb (88 kg)   BMI 35.48 kg/m   Filed Weights   11/27/17 0959  Weight: 194 lb (88 kg)    GENERAL: Well-nourished well-developed; Alert, no distress and comfortable.  She is alone.  EYES: no pallor or icterus OROPHARYNX: no thrush or ulceration; good dentition  NECK: supple, no masses felt LYMPH:  no palpable lymphadenopathy in the cervical, axillary or inguinal regions LUNGS: clear to auscultation and  No wheeze or crackles HEART/CVS: regular rate & rhythm and no murmurs; No lower extremity edema ABDOMEN:abdomen soft, non-tender and normal bowel sounds Musculoskeletal:no cyanosis of digits and no clubbing  PSYCH: alert & oriented x 3 with fluent speech NEURO: no focal motor/sensory deficits SKIN:  No rash.     LABORATORY DATA:  I have reviewed the data as listed    Component Value Date/Time   NA 135 11/27/2017 0901   NA 130 (L) 06/06/2014 1102   K 4.1 11/27/2017 0901   K 3.9 06/06/2014 1102   CL 105 11/27/2017 0901   CL 95 (L) 06/06/2014 1102   CO2 21 (L) 11/27/2017 0901   CO2 28 06/06/2014 1102   GLUCOSE 84 11/27/2017 0901   GLUCOSE 349 (H) 06/06/2014 1102   BUN 38 (H) 11/27/2017 0901   BUN 17 06/06/2014 1102   CREATININE 3.09 (H) 11/27/2017 0901   CREATININE 1.63 (H) 06/06/2014 1102   CALCIUM 8.7 (L) 11/27/2017 0901   CALCIUM 9.2 06/06/2014 1102   PROT 7.4 11/27/2017 0901   PROT 8.2 06/06/2014 1102   ALBUMIN 3.7 11/27/2017 0901   ALBUMIN 3.3 (L) 06/06/2014 1102   AST 17 11/27/2017 0901   AST 7 (L) 06/06/2014 1102   ALT 21 11/27/2017 0901    ALT 12 (L) 06/06/2014 1102   ALKPHOS 69 11/27/2017 0901   ALKPHOS 74 06/06/2014 1102   BILITOT 0.6 11/27/2017 0901   BILITOT 0.4 06/06/2014 1102   GFRNONAA 15 (L) 11/27/2017 0901   GFRNONAA 35 (L) 06/06/2014 1102   GFRAA 18 (L) 11/27/2017 0901   GFRAA 42 (L) 06/06/2014 1102    No results found for: SPEP, UPEP  Lab Results  Component Value Date   WBC 4.0 11/27/2017   NEUTROABS 2.6 11/27/2017   HGB 9.3 (L) 11/27/2017   HCT 26.3 (L) 11/27/2017   MCV 100.5 (H) 11/27/2017   PLT 352 11/27/2017      Chemistry      Component Value Date/Time   NA 135 11/27/2017 0901   NA 130 (L) 06/06/2014 1102   K 4.1 11/27/2017 0901   K 3.9 06/06/2014 1102   CL 105 11/27/2017 0901   CL 95 (L) 06/06/2014 1102   CO2 21 (L) 11/27/2017 0901   CO2 28 06/06/2014 1102   BUN 38 (H) 11/27/2017 0901   BUN 17 06/06/2014 1102   CREATININE 3.09 (H) 11/27/2017 0901   CREATININE 1.63 (H) 06/06/2014 1102      Component Value Date/Time   CALCIUM 8.7 (L) 11/27/2017 0901   CALCIUM 9.2 06/06/2014 1102   ALKPHOS 69 11/27/2017 0901   ALKPHOS 74 06/06/2014 1102   AST 17 11/27/2017 0901   AST 7 (L) 06/06/2014 1102   ALT 21 11/27/2017 0901   ALT 12 (L) 06/06/2014 1102   BILITOT 0.6 11/27/2017 0901   BILITOT 0.4 06/06/2014 1102  IMPRESSION: 1. Progressive malignancy, with enlarging pleural-based masses on the left side which are mildly but abnormally hypermetabolic, and progression of the lytic right iliac bone lesion which now has a more permeative appearance. 2. Subtle foci of activity at the right eighth and tenth rib costotransverse articulations, probably benign but meriting surveillance. 3. A pleural-based nodule along the right middle lobe is stable, as is mild nodularity along the major fissure on the right, and neither are appreciably hypermetabolic at this time. 4. Other imaging findings of potential clinical significance: Right-sided sinusitis with complete opacification of the  right frontal and maxillary sinuses, and evidence of chronic right ethmoid and sphenoid sinusitis. Mild-to-moderate cardiomegaly. Aortic Atherosclerosis (ICD10-I70.0). Non hypermetabolic sclerotic lesions in the sternum and multiple thoracic vertebral bodies compatible with inactive prior metastatic lesions.   Electronically Signed   By: Van Clines M.D.   On: 11/02/2017 10:50    Results for SHANEIKA, ROSSA (MRN [id number]) as of 11/06/2017 10:22  Ref. Range 11/21/2016 09:44 12/19/2016 13:05 02/02/2017 09:15 03/09/2017 10:55 04/09/2017 10:04 05/07/2017 09:34 06/04/2017 10:16 07/09/2017 10:29 08/07/2017 09:35 09/04/2017 09:50 10/02/2017 09:08  CA 27.29 Latest Ref Range: 0.0 - 38.6 U/mL 67.3 (H) 70.7 (H) 74.9 (H)          CA 27.29 Latest Ref Range: 0.0 - 38.6 U/mL    89.6 (H) 92.2 (H) 98.0 (H) 109.7 (H) 114.0 (H) 105.9 (H) 106.3 (H) 100.4 (H)     ASSESSMENT & PLAN:  Carcinoma of upper-inner quadrant of left breast in female, estrogen receptor positive ([employer]) # Left breast cancer stage IV- April 2019- PET- progressive of pleural based mets/rising TM-currently on Taxol weekly; day 15 off; every 28-day cycle.  # proceed with ccyle #1 day 15 today.  No obvious clinical progression tolerating well; Labs today reviewed;  acceptable for treatment today.   #Diabetes on insulin; recommend checking blood sugars closely around the time of chemotherapy-as premedications include steroids/rise blood sugars.  # Anemia/ IDA-CKD- Hb 9.3/ recommend compliance with PO iron.   # CKD Stage IV- creat 3.5/STABLE; but  No dialysis yet. Monitored closely nephrology. S/p recent eval with Dr.Kolluru.   # Taxol; weekly cbc/taxol- in 2 weeks/ 5/10.      Cammie Sickle, MD 11/28/2017 8:06 AM

## 2017-11-27 NOTE — Progress Notes (Signed)
Pt states that she was not aware that she should have stopped femara. Reminded Pt to stop taking femara. Teach back process performed.

## 2017-11-27 NOTE — Assessment & Plan Note (Addendum)
#   Left breast cancer stage IV- April 2019- PET- progressive of pleural based mets/rising TM-currently on Taxol weekly; day 15 off; every 28-day cycle.  # proceed with ccyle #1 day 15 today.  No obvious clinical progression tolerating well; Labs today reviewed;  acceptable for treatment today.   #Diabetes on insulin; recommend checking blood sugars closely around the time of chemotherapy-as premedications include steroids/rise blood sugars.  # Anemia/ IDA-CKD- Hb 9.3/ recommend compliance with PO iron.   # CKD Stage IV- creat 3.5/STABLE; but  No dialysis yet. Monitored closely nephrology. S/p recent eval with Dr.Kolluru.   # Taxol; weekly cbc/taxol- in 2 weeks/ 5/10.

## 2017-11-27 NOTE — Progress Notes (Signed)
Cr =3.09. Ok to give treatment per MD.

## 2017-12-01 ENCOUNTER — Other Ambulatory Visit: Payer: Self-pay | Admitting: Internal Medicine

## 2017-12-04 ENCOUNTER — Other Ambulatory Visit: Payer: Medicare Other

## 2017-12-04 ENCOUNTER — Ambulatory Visit: Payer: Medicare Other

## 2017-12-10 ENCOUNTER — Other Ambulatory Visit: Payer: Self-pay | Admitting: Internal Medicine

## 2017-12-10 ENCOUNTER — Other Ambulatory Visit: Payer: Self-pay | Admitting: *Deleted

## 2017-12-10 NOTE — Telephone Encounter (Signed)
Refill request inappropriate

## 2017-12-11 ENCOUNTER — Inpatient Hospital Stay: Payer: Medicare Other | Attending: Internal Medicine

## 2017-12-11 ENCOUNTER — Inpatient Hospital Stay (HOSPITAL_BASED_OUTPATIENT_CLINIC_OR_DEPARTMENT_OTHER): Payer: Medicare Other | Admitting: Internal Medicine

## 2017-12-11 ENCOUNTER — Inpatient Hospital Stay: Payer: Medicare Other

## 2017-12-11 VITALS — BP 142/89 | HR 76

## 2017-12-11 VITALS — BP 154/91 | HR 73 | Temp 97.9°F | Resp 16 | Wt 193.2 lb

## 2017-12-11 DIAGNOSIS — Z5111 Encounter for antineoplastic chemotherapy: Secondary | ICD-10-CM | POA: Diagnosis not present

## 2017-12-11 DIAGNOSIS — D631 Anemia in chronic kidney disease: Secondary | ICD-10-CM

## 2017-12-11 DIAGNOSIS — Z170 Estrogen receptor positive status [ER+]: Secondary | ICD-10-CM | POA: Diagnosis not present

## 2017-12-11 DIAGNOSIS — E1122 Type 2 diabetes mellitus with diabetic chronic kidney disease: Secondary | ICD-10-CM | POA: Insufficient documentation

## 2017-12-11 DIAGNOSIS — N184 Chronic kidney disease, stage 4 (severe): Secondary | ICD-10-CM

## 2017-12-11 DIAGNOSIS — C7951 Secondary malignant neoplasm of bone: Secondary | ICD-10-CM | POA: Insufficient documentation

## 2017-12-11 DIAGNOSIS — Z79811 Long term (current) use of aromatase inhibitors: Secondary | ICD-10-CM | POA: Insufficient documentation

## 2017-12-11 DIAGNOSIS — R5383 Other fatigue: Secondary | ICD-10-CM | POA: Insufficient documentation

## 2017-12-11 DIAGNOSIS — I700 Atherosclerosis of aorta: Secondary | ICD-10-CM | POA: Diagnosis not present

## 2017-12-11 DIAGNOSIS — I130 Hypertensive heart and chronic kidney disease with heart failure and stage 1 through stage 4 chronic kidney disease, or unspecified chronic kidney disease: Secondary | ICD-10-CM | POA: Insufficient documentation

## 2017-12-11 DIAGNOSIS — Z79899 Other long term (current) drug therapy: Secondary | ICD-10-CM | POA: Diagnosis not present

## 2017-12-11 DIAGNOSIS — I252 Old myocardial infarction: Secondary | ICD-10-CM | POA: Diagnosis not present

## 2017-12-11 DIAGNOSIS — Z9221 Personal history of antineoplastic chemotherapy: Secondary | ICD-10-CM | POA: Insufficient documentation

## 2017-12-11 DIAGNOSIS — J45909 Unspecified asthma, uncomplicated: Secondary | ICD-10-CM | POA: Insufficient documentation

## 2017-12-11 DIAGNOSIS — Z8673 Personal history of transient ischemic attack (TIA), and cerebral infarction without residual deficits: Secondary | ICD-10-CM

## 2017-12-11 DIAGNOSIS — Z794 Long term (current) use of insulin: Secondary | ICD-10-CM

## 2017-12-11 DIAGNOSIS — C782 Secondary malignant neoplasm of pleura: Secondary | ICD-10-CM

## 2017-12-11 DIAGNOSIS — I509 Heart failure, unspecified: Secondary | ICD-10-CM

## 2017-12-11 DIAGNOSIS — D509 Iron deficiency anemia, unspecified: Secondary | ICD-10-CM | POA: Insufficient documentation

## 2017-12-11 DIAGNOSIS — R5381 Other malaise: Secondary | ICD-10-CM | POA: Insufficient documentation

## 2017-12-11 DIAGNOSIS — C771 Secondary and unspecified malignant neoplasm of intrathoracic lymph nodes: Secondary | ICD-10-CM | POA: Diagnosis not present

## 2017-12-11 DIAGNOSIS — J209 Acute bronchitis, unspecified: Secondary | ICD-10-CM | POA: Insufficient documentation

## 2017-12-11 DIAGNOSIS — C50212 Malignant neoplasm of upper-inner quadrant of left female breast: Secondary | ICD-10-CM

## 2017-12-11 DIAGNOSIS — Z7982 Long term (current) use of aspirin: Secondary | ICD-10-CM

## 2017-12-11 DIAGNOSIS — Z87891 Personal history of nicotine dependence: Secondary | ICD-10-CM | POA: Insufficient documentation

## 2017-12-11 DIAGNOSIS — Z8041 Family history of malignant neoplasm of ovary: Secondary | ICD-10-CM | POA: Insufficient documentation

## 2017-12-11 LAB — COMPREHENSIVE METABOLIC PANEL
ALBUMIN: 3.4 g/dL — AB (ref 3.5–5.0)
ALT: 13 U/L — ABNORMAL LOW (ref 14–54)
ANION GAP: 9 (ref 5–15)
AST: 17 U/L (ref 15–41)
Alkaline Phosphatase: 79 U/L (ref 38–126)
BUN: 50 mg/dL — ABNORMAL HIGH (ref 6–20)
CHLORIDE: 102 mmol/L (ref 101–111)
CO2: 23 mmol/L (ref 22–32)
Calcium: 9.4 mg/dL (ref 8.9–10.3)
Creatinine, Ser: 3.88 mg/dL — ABNORMAL HIGH (ref 0.44–1.00)
GFR calc Af Amer: 13 mL/min — ABNORMAL LOW (ref >60)
GFR calc non Af Amer: 12 mL/min — ABNORMAL LOW (ref >60)
Glucose, Bld: 93 mg/dL (ref 65–99)
POTASSIUM: 4.6 mmol/L (ref 3.5–5.1)
SODIUM: 134 mmol/L — AB (ref 135–145)
Total Bilirubin: 0.5 mg/dL (ref 0.3–1.2)
Total Protein: 7.8 g/dL (ref 6.5–8.1)

## 2017-12-11 LAB — CBC WITH DIFFERENTIAL/PLATELET
BASOS PCT: 1 %
Basophils Absolute: 0.2 10*3/uL — ABNORMAL HIGH (ref 0–0.1)
Eosinophils Absolute: 0.1 10*3/uL (ref 0–0.7)
Eosinophils Relative: 1 %
HEMATOCRIT: 25.5 % — AB (ref 35.0–47.0)
HEMOGLOBIN: 8.8 g/dL — AB (ref 12.0–16.0)
LYMPHS ABS: 1.7 10*3/uL (ref 1.0–3.6)
LYMPHS PCT: 14 %
MCH: 33.7 pg (ref 26.0–34.0)
MCHC: 34.6 g/dL (ref 32.0–36.0)
MCV: 97.4 fL (ref 80.0–100.0)
MONOS PCT: 12 %
Monocytes Absolute: 1.4 10*3/uL — ABNORMAL HIGH (ref 0.2–0.9)
NEUTROS ABS: 8.6 10*3/uL — AB (ref 1.4–6.5)
NEUTROS PCT: 72 %
Platelets: 439 10*3/uL (ref 150–440)
RBC: 2.62 MIL/uL — ABNORMAL LOW (ref 3.80–5.20)
RDW: 14.5 % (ref 11.5–14.5)
WBC: 12 10*3/uL — ABNORMAL HIGH (ref 3.6–11.0)

## 2017-12-11 MED ORDER — HEPARIN SOD (PORK) LOCK FLUSH 100 UNIT/ML IV SOLN
500.0000 [IU] | Freq: Once | INTRAVENOUS | Status: AC
  Administered 2017-12-11: 500 [IU] via INTRAVENOUS
  Filled 2017-12-11: qty 5

## 2017-12-11 MED ORDER — SODIUM CHLORIDE 0.9% FLUSH
10.0000 mL | INTRAVENOUS | Status: DC | PRN
  Administered 2017-12-11: 10 mL via INTRAVENOUS
  Filled 2017-12-11: qty 10

## 2017-12-11 MED ORDER — DIPHENHYDRAMINE HCL 50 MG/ML IJ SOLN
50.0000 mg | Freq: Once | INTRAMUSCULAR | Status: AC
  Administered 2017-12-11: 50 mg via INTRAVENOUS
  Filled 2017-12-11: qty 1

## 2017-12-11 MED ORDER — DEXAMETHASONE SODIUM PHOSPHATE 10 MG/ML IJ SOLN
8.0000 mg | Freq: Once | INTRAMUSCULAR | Status: AC
  Administered 2017-12-11: 8 mg via INTRAVENOUS
  Filled 2017-12-11: qty 1

## 2017-12-11 MED ORDER — FAMOTIDINE IN NACL 20-0.9 MG/50ML-% IV SOLN
20.0000 mg | Freq: Once | INTRAVENOUS | Status: AC
  Administered 2017-12-11: 20 mg via INTRAVENOUS
  Filled 2017-12-11: qty 50

## 2017-12-11 MED ORDER — HEPARIN SOD (PORK) LOCK FLUSH 100 UNIT/ML IV SOLN: 500.0000 [IU] | Freq: Once | INTRAVENOUS | Status: DC | PRN

## 2017-12-11 MED ORDER — SODIUM CHLORIDE 0.9 % IV SOLN
Freq: Once | INTRAVENOUS | Status: AC
  Administered 2017-12-11: 10:00:00 via INTRAVENOUS
  Filled 2017-12-11: qty 1000

## 2017-12-11 MED ORDER — SODIUM CHLORIDE 0.9 % IV SOLN
80.0000 mg/m2 | Freq: Once | INTRAVENOUS | Status: AC
  Administered 2017-12-11: 156 mg via INTRAVENOUS
  Filled 2017-12-11: qty 26

## 2017-12-11 MED ORDER — SODIUM CHLORIDE 0.9% FLUSH
10.0000 mL | INTRAVENOUS | Status: DC | PRN
  Filled 2017-12-11: qty 10

## 2017-12-11 MED ORDER — DEXAMETHASONE SODIUM PHOSPHATE 100 MG/10ML IJ SOLN: 8.0000 mg | Freq: Once | INTRAMUSCULAR | Status: DC

## 2017-12-11 NOTE — Assessment & Plan Note (Addendum)
#  Left breast cancer-stage IV ER PR positive HER-2/neu negative; PET scan April 2019 progressive pleural mets/rising tumor marker. Currently on Taxol  #Proceed with cycle number 2-day 1 Taxol today. Labs today reviewed;  acceptable for treatment today- except for- Elevated creatinine 3.8  #Chronic kidney disease/stage III/IV creatinine worsening 3.8 clinically asymptomatic.  Follow closely with nephrology.  #Anemia chronic kidney disease/chemotherapy hemoglobin 8.5.  Monitor closely.  #Acute bronchitis-status post Z-Pak symptoms improving not resolved.;  Recommend Mucinex/Claritin.  # cbc/bmp/taxol- 1 week; in 2 weeks/labs-Taxol; Ca-27-29.

## 2017-12-11 NOTE — Patient Instructions (Addendum)
#  Take Claritin once a day over-the-counter.   #Mucinex twice a day-over-the-counter

## 2017-12-11 NOTE — Progress Notes (Signed)
[place] OFFICE PROGRESS NOTE  Patient Care Team: [name], [name]  Cancer Staging No matching staging information was found for the patient.   Oncology History   # OCT 2015-STAGE IV LEFT BREAST T2N1 [T=4cm; [name]; [name]; [name]; EBUS- Positive Paratrac/subcarinal LN s/p ? Taxotere [in [place]; [name]; [name];[name]; [name]; suv 3]; [name]; [name]; [name];  [name]; Ant R iliac bone; [name]; [name]; START-Taxol weekly.   # Poorly controlled Blood sugars- improved.   # Pancreatitis Hx/ CKD IV [creat ~ 3-4]; [name]; mild left sided weakness]     Carcinoma of upper-inner quadrant of left breast in female, estrogen receptor positive ([place])      INTERVAL HISTORY:  [name] 62 y.o.  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  Patient was diagnosed with acute bronchitis treated with Z-Pak.-Her symptoms of cough are improving.  However complains of continued congestion /thick mucus.  No hemoptysis.  No fevers.  No swelling in the legs.  No tingling numbness.  Review of Systems  Constitutional: Negative for chills, diaphoresis, fever, malaise/fatigue and weight loss.  HENT: Negative for nosebleeds and sore throat.   Eyes: Negative for double vision.  Respiratory: Positive for cough and shortness of breath. Negative for  hemoptysis, sputum production and wheezing.   Cardiovascular: Negative for chest pain, palpitations, orthopnea and leg swelling.  Gastrointestinal: Negative for abdominal pain, blood in stool, constipation, diarrhea, heartburn, melena, nausea and vomiting.  Genitourinary: Negative for dysuria, frequency and urgency.  Musculoskeletal: Negative for back pain and joint pain.  Skin: Negative.  Negative for itching and rash.  Neurological: Negative for dizziness, tingling, focal weakness, weakness and headaches.  Endo/Heme/Allergies: Does not bruise/bleed easily.  Psychiatric/Behavioral: Negative for depression. The patient is not nervous/anxious and does not have insomnia.       PAST MEDICAL HISTORY :  Past Medical History:  Diagnosis Date  . Asthma   . CHF (congestive heart failure) ([place]) 1997  . CKD (chronic kidney disease)   . Depression   . Diabetes mellitus, type 2 ([place])   . Hair loss   . History of left breast cancer 05/29/14  . History of partial hysterectomy 12/31/2016   Per patient.  Has not had a period in years.  Had a partial hysterectomy years ago.  . Obesity   . Pancreatitis 1997  . Stroke [facility]) 2010   with mild left arm weakness    PAST SURGICAL HISTORY :   Past Surgical History:  Procedure Laterality Date  . CESAREAN SECTION    . CHOLECYSTECTOMY    . PARTIAL HYSTERECTOMY  12/31/2016   Per patient, she has not had a period in years since she had a partial hysterectomy.    FAMILY HISTORY :   Family History  Problem Relation Age of Onset  . Ovarian cancer Mother   . Diabetes Mother   . Hypertension Mother   . COPD Father   .  Hypertension Father   . Diabetes Sister   . Diabetes Brother     SOCIAL HISTORY:   Social History   Tobacco Use  . Smoking status: Former Smoker    Packs/day: 0.50    Years: 1.00    Pack years: 0.50    Types: Cigarettes  . Smokeless tobacco: Never Used  Substance Use Topics  . Alcohol use: No    Alcohol/week: 0.0 oz  . Drug  use: No    ALLERGIES:  has No Known Allergies.  MEDICATIONS:  Current Outpatient Medications  Medication Sig Dispense Refill  . acetaminophen-codeine (TYLENOL #4) 300-60 MG tablet Take 2 tablets by mouth every 6 (six) hours as needed for moderate pain.     Marland Kitchen albuterol (PROAIR HFA) 108 (90 BASE) MCG/ACT inhaler Inhale 1 puff into the lungs every 6 (six) hours as needed for shortness of breath.     Marland Kitchen albuterol (PROVENTIL) (2.5 MG/3ML) 0.083% nebulizer solution Inhale 2.5 mg into the lungs every 6 (six) hours as needed for shortness of breath.     . ALPRAZolam (XANAX) 0.5 MG tablet Take 0.5 mg by mouth at bedtime as needed for anxiety or sleep.     Marland Kitchen amLODipine (NORVASC) 10 MG tablet Take 10 mg by mouth daily.     Marland Kitchen aspirin EC 81 MG tablet Take 81 mg by mouth once.     Marland Kitchen atenolol (TENORMIN) 100 MG tablet TAKE 1 TABLETS BY MOUTH TWICE DAILY    . B-D ULTRA-FINE 33 LANCETS MISC Use 1 each 2 (two) times daily.    . B-D ULTRAFINE III SHORT PEN 31G X 8 MM MISC     . bumetanide (BUMEX) 0.5 MG tablet TAKE 1 TABLET BY MOUTH TWICE DAILY    . calcitRIOL (ROCALTROL) 0.25 MCG capsule Take 0.25 mcg by mouth 3 (three) times a week.    . Cinnamon 500 MG capsule Take 500 mg by mouth daily.     . cloNIDine (CATAPRES) 0.2 MG tablet Take 0.2 mg by mouth 2 (two) times daily.     . enalapril (VASOTEC) 20 MG tablet Take 20 mg by mouth 2 (two) times daily.     . ferrous sulfate 325 (65 FE) MG tablet Take 325 mg by mouth 2 (two) times daily with a meal.    . FLUoxetine (PROZAC) 20 MG capsule Take 20 mg by mouth 2 (two) times daily.     Marland Kitchen glucose blood (ONE TOUCH ULTRA TEST) test strip Use 1 each 2 (two) times daily. Use as instructed.    . glyBURIDE (DIABETA) 5 MG tablet Take 5 mg by mouth daily with breakfast.     . LEVEMIR FLEXTOUCH 100 UNIT/ML Pen Inject 55 Units into the skin daily.     . metoprolol tartrate (LOPRESSOR) 25 MG tablet TAKE 1 TABLET BY MOUTH TWICE DAILY 60 tablet 0  . mometasone (NASONEX) 50  MCG/ACT nasal spray Place 2 sprays into the nose daily as needed.     Marland Kitchen NOVOLOG FLEXPEN 100 UNIT/ML FlexPen Inject 7 Units into the skin 2 (two) times daily at 8 am and 10 pm.     . ondansetron (ZOFRAN) 8 MG tablet One pill every 8 hours as needed for nausea/vomitting. 40 tablet 1  . prochlorperazine (COMPAZINE) 5 MG tablet TAKE 2 TABLETS BY MOUTH EVERY 6 HOURS AS NEEDED FOR NAUSEA OR VOMITING 80 tablet 0  . salmeterol (SEREVENT) 50 MCG/DOSE diskus inhaler Inhale 1 puff into the lungs 2 (two) times daily.    Marland Kitchen  simvastatin (ZOCOR) 20 MG tablet Take 20 mg by mouth daily at 6 PM.     . vitamin B-12 (CYANOCOBALAMIN) 1000 MCG tablet Take 1,000 mcg by mouth daily.     No current facility-administered medications for this visit.    Facility-Administered Medications Ordered in Other Visits  Medication Dose Route Frequency Provider Last Rate Last Dose  . sodium chloride flush (NS) 0.9 % injection 10 mL  10 mL Intravenous PRN Cammie Sickle, MD   10 mL at 01/30/16 1054    PHYSICAL EXAMINATION: ECOG PERFORMANCE STATUS: 0 - Asymptomatic  BP (!) 154/91 (BP Location: Left Arm, Patient Position: Sitting)   Pulse 73   Temp 97.9 F (36.6 C)   Resp 16   Wt 193 lb 3.2 oz (87.6 kg)   BMI 35.34 kg/m   Filed Weights   12/11/17 0914 12/11/17 0923  Weight: 193 lb 3.2 oz (87.6 kg) 193 lb 3.2 oz (87.6 kg)    GENERAL: Well-nourished well-developed; Alert, no distress and comfortable.  Patient is alone. EYES: no pallor or icterus OROPHARYNX: no thrush or ulceration; NECK: supple; no lymph nodes felt. LYMPH:  no palpable lymphadenopathy in the axillary or inguinal regions LUNGS: Decreased breath sounds auscultation bilaterally. No wheeze or crackles HEART/CVS: regular rate & rhythm and no murmurs; No lower extremity edema ABDOMEN:abdomen soft, non-tender and normal bowel sounds. No hepatomegaly or splenomegaly.  Musculoskeletal:no cyanosis of digits and no clubbing  PSYCH: alert & oriented x 3  with fluent speech NEURO: no focal motor/sensory deficits SKIN:  no rashes or significant lesions    LABORATORY DATA:  I have reviewed the data as listed    Component Value Date/Time   NA 134 (L) 12/11/2017 0905   NA 130 (L) 06/06/2014 1102   K 4.6 12/11/2017 0905   K 3.9 06/06/2014 1102   CL 102 12/11/2017 0905   CL 95 (L) 06/06/2014 1102   CO2 23 12/11/2017 0905   CO2 28 06/06/2014 1102   GLUCOSE 93 12/11/2017 0905   GLUCOSE 349 (H) 06/06/2014 1102   BUN 50 (H) 12/11/2017 0905   BUN 17 06/06/2014 1102   CREATININE 3.88 (H) 12/11/2017 0905   CREATININE 1.63 (H) 06/06/2014 1102   CALCIUM 9.4 12/11/2017 0905   CALCIUM 9.2 06/06/2014 1102   PROT 7.8 12/11/2017 0905   PROT 8.2 06/06/2014 1102   ALBUMIN 3.4 (L) 12/11/2017 0905   ALBUMIN 3.3 (L) 06/06/2014 1102   AST 17 12/11/2017 0905   AST 7 (L) 06/06/2014 1102   ALT 13 (L) 12/11/2017 0905   ALT 12 (L) 06/06/2014 1102   ALKPHOS 79 12/11/2017 0905   ALKPHOS 74 06/06/2014 1102   BILITOT 0.5 12/11/2017 0905   BILITOT 0.4 06/06/2014 1102   GFRNONAA 12 (L) 12/11/2017 0905   GFRNONAA 35 (L) 06/06/2014 1102   GFRAA 13 (L) 12/11/2017 0905   GFRAA 42 (L) 06/06/2014 1102    No results found for: SPEP, UPEP  Lab Results  Component Value Date   WBC 12.0 (H) 12/11/2017   NEUTROABS 8.6 (H) 12/11/2017   HGB 8.8 (L) 12/11/2017   HCT 25.5 (L) 12/11/2017   MCV 97.4 12/11/2017   PLT 439 12/11/2017      Chemistry      Component Value Date/Time   NA 134 (L) 12/11/2017 0905   NA 130 (L) 06/06/2014 1102   K 4.6 12/11/2017 0905   K 3.9 06/06/2014 1102   CL 102 12/11/2017 0905   CL 95 (L) 06/06/2014 1102  CO2 23 12/11/2017 0905   CO2 28 06/06/2014 1102   BUN 50 (H) 12/11/2017 0905   BUN 17 06/06/2014 1102   CREATININE 3.88 (H) 12/11/2017 0905   CREATININE 1.63 (H) 06/06/2014 1102      Component Value Date/Time   CALCIUM 9.4 12/11/2017 0905   CALCIUM 9.2 06/06/2014 1102   ALKPHOS 79 12/11/2017 0905   ALKPHOS 74  06/06/2014 1102   AST 17 12/11/2017 0905   AST 7 (L) 06/06/2014 1102   ALT 13 (L) 12/11/2017 0905   ALT 12 (L) 06/06/2014 1102   BILITOT 0.5 12/11/2017 0905   BILITOT 0.4 06/06/2014 1102       RADIOGRAPHIC STUDIES: I have personally reviewed the radiological images as listed and agreed with the findings in the report. No results found.   ASSESSMENT & PLAN:  Carcinoma of upper-inner quadrant of left breast in female, estrogen receptor positive ([place]) #Left breast cancer-stage IV ER PR positive HER-2/neu negative; PET scan April 2019 progressive pleural mets/rising tumor marker. Currently on Taxol  #Proceed with cycle number 2-day 1 Taxol today. Labs today reviewed;  acceptable for treatment today- except for- Elevated creatinine 3.8  #Chronic kidney disease/stage III/IV creatinine worsening 3.8 clinically asymptomatic.  Follow closely with nephrology.  #Anemia chronic kidney disease/chemotherapy hemoglobin 8.5.  Monitor closely.  #Acute bronchitis-status post Z-Pak symptoms improving not resolved.;  Recommend Mucinex/Claritin.  # cbc/bmp/taxol- 1 week; in 2 weeks/labs-Taxol; Ca-27-29.    Orders Placed This Encounter  Procedures  . CBC with Differential/Platelet    Standing Status:   Future    Standing Expiration Date:   12/12/2018  . Comprehensive metabolic panel    Standing Status:   Future    Standing Expiration Date:   12/12/2018  . CBC with Differential/Platelet    Standing Status:   Future    Standing Expiration Date:   12/12/2018  . Comprehensive metabolic panel    Standing Status:   Future    Standing Expiration Date:   12/12/2018  . Cancer antigen 27.29    Standing Status:   Future    Standing Expiration Date:   12/12/2018   All questions were answered. The patient knows to call the clinic with any problems, questions or concerns.      Cammie Sickle, MD 12/11/2017 7:30 PM

## 2017-12-12 LAB — CANCER ANTIGEN 27.29: CAN 27.29: 98.4 U/mL — AB (ref 0.0–38.6)

## 2017-12-17 ENCOUNTER — Other Ambulatory Visit: Payer: Self-pay | Admitting: *Deleted

## 2017-12-17 MED ORDER — PROCHLORPERAZINE MALEATE 10 MG PO TABS: 10.0000 mg | ORAL_TABLET | Freq: Four times a day (QID) | ORAL | 0 refills | Status: DC | PRN

## 2017-12-18 ENCOUNTER — Inpatient Hospital Stay: Payer: Medicare Other

## 2017-12-18 VITALS — BP 127/80 | HR 74 | Temp 97.8°F | Resp 20 | Wt 194.4 lb

## 2017-12-18 DIAGNOSIS — Z170 Estrogen receptor positive status [ER+]: Secondary | ICD-10-CM | POA: Diagnosis not present

## 2017-12-18 DIAGNOSIS — C782 Secondary malignant neoplasm of pleura: Secondary | ICD-10-CM | POA: Diagnosis not present

## 2017-12-18 DIAGNOSIS — C771 Secondary and unspecified malignant neoplasm of intrathoracic lymph nodes: Secondary | ICD-10-CM | POA: Diagnosis not present

## 2017-12-18 DIAGNOSIS — C50212 Malignant neoplasm of upper-inner quadrant of left female breast: Secondary | ICD-10-CM | POA: Diagnosis not present

## 2017-12-18 DIAGNOSIS — C7951 Secondary malignant neoplasm of bone: Secondary | ICD-10-CM | POA: Diagnosis not present

## 2017-12-18 DIAGNOSIS — Z5111 Encounter for antineoplastic chemotherapy: Secondary | ICD-10-CM | POA: Diagnosis not present

## 2017-12-18 LAB — CBC WITH DIFFERENTIAL/PLATELET
BASOS ABS: 0.1 10*3/uL (ref 0–0.1)
Basophils Relative: 1 %
EOS PCT: 2 %
Eosinophils Absolute: 0.1 10*3/uL (ref 0–0.7)
HCT: 24.8 % — ABNORMAL LOW (ref 35.0–47.0)
HEMOGLOBIN: 8.8 g/dL — AB (ref 12.0–16.0)
Lymphocytes Relative: 18 %
Lymphs Abs: 1.4 10*3/uL (ref 1.0–3.6)
MCH: 34.4 pg — ABNORMAL HIGH (ref 26.0–34.0)
MCHC: 35.5 g/dL (ref 32.0–36.0)
MCV: 96.9 fL (ref 80.0–100.0)
Monocytes Absolute: 0.5 10*3/uL (ref 0.2–0.9)
Monocytes Relative: 7 %
Neutro Abs: 5.6 10*3/uL (ref 1.4–6.5)
Neutrophils Relative %: 72 %
PLATELETS: 369 10*3/uL (ref 150–440)
RBC: 2.56 MIL/uL — AB (ref 3.80–5.20)
RDW: 14.6 % — ABNORMAL HIGH (ref 11.5–14.5)
WBC: 7.7 10*3/uL (ref 3.6–11.0)

## 2017-12-18 LAB — COMPREHENSIVE METABOLIC PANEL
ALT: 15 U/L (ref 14–54)
AST: 19 U/L (ref 15–41)
Albumin: 3.5 g/dL (ref 3.5–5.0)
Alkaline Phosphatase: 74 U/L (ref 38–126)
Anion gap: 9 (ref 5–15)
BILIRUBIN TOTAL: 0.6 mg/dL (ref 0.3–1.2)
BUN: 46 mg/dL — AB (ref 6–20)
CHLORIDE: 103 mmol/L (ref 101–111)
CO2: 22 mmol/L (ref 22–32)
CREATININE: 3.06 mg/dL — AB (ref 0.44–1.00)
Calcium: 8.7 mg/dL — ABNORMAL LOW (ref 8.9–10.3)
GFR, EST AFRICAN AMERICAN: 18 mL/min — AB (ref >60)
GFR, EST NON AFRICAN AMERICAN: 16 mL/min — AB (ref >60)
Glucose, Bld: 95 mg/dL (ref 65–99)
Potassium: 4.3 mmol/L (ref 3.5–5.1)
Sodium: 134 mmol/L — ABNORMAL LOW (ref 135–145)
TOTAL PROTEIN: 7.6 g/dL (ref 6.5–8.1)

## 2017-12-18 MED ORDER — SODIUM CHLORIDE 0.9 % IV SOLN
80.0000 mg/m2 | Freq: Once | INTRAVENOUS | Status: AC
  Administered 2017-12-18: 156 mg via INTRAVENOUS
  Filled 2017-12-18: qty 26

## 2017-12-18 MED ORDER — DIPHENHYDRAMINE HCL 50 MG/ML IJ SOLN
50.0000 mg | Freq: Once | INTRAMUSCULAR | Status: AC
  Administered 2017-12-18: 50 mg via INTRAVENOUS
  Filled 2017-12-18: qty 1

## 2017-12-18 MED ORDER — SODIUM CHLORIDE 0.9 % IV SOLN
Freq: Once | INTRAVENOUS | Status: AC
  Administered 2017-12-18: 09:00:00 via INTRAVENOUS
  Filled 2017-12-18: qty 1000

## 2017-12-18 MED ORDER — DEXAMETHASONE SODIUM PHOSPHATE 10 MG/ML IJ SOLN
8.0000 mg | Freq: Once | INTRAMUSCULAR | Status: AC
Start: 2017-12-18 — End: 2017-12-18
  Administered 2017-12-18: 8 mg via INTRAVENOUS
  Filled 2017-12-18: qty 1

## 2017-12-18 MED ORDER — FAMOTIDINE IN NACL 20-0.9 MG/50ML-% IV SOLN
20.0000 mg | Freq: Once | INTRAVENOUS | Status: AC
  Administered 2017-12-18: 20 mg via INTRAVENOUS
  Filled 2017-12-18: qty 50

## 2017-12-18 MED ORDER — HEPARIN SOD (PORK) LOCK FLUSH 100 UNIT/ML IV SOLN: 500.0000 [IU] | Freq: Once | INTRAVENOUS | Status: DC | PRN

## 2017-12-18 MED ORDER — HEPARIN SOD (PORK) LOCK FLUSH 100 UNIT/ML IV SOLN
500.0000 [IU] | Freq: Once | INTRAVENOUS | Status: AC
  Administered 2017-12-18: 500 [IU] via INTRAVENOUS
  Filled 2017-12-18: qty 5

## 2017-12-18 MED ORDER — SODIUM CHLORIDE 0.9% FLUSH
10.0000 mL | INTRAVENOUS | Status: DC | PRN
  Administered 2017-12-18: 10 mL via INTRAVENOUS
  Filled 2017-12-18: qty 10

## 2017-12-19 ENCOUNTER — Other Ambulatory Visit: Payer: Self-pay | Admitting: Internal Medicine

## 2017-12-19 DIAGNOSIS — C50912 Malignant neoplasm of unspecified site of left female breast: Secondary | ICD-10-CM

## 2017-12-21 NOTE — Telephone Encounter (Signed)
Was discontinued on her medicine list and is not mentioned in physician office note that she is on it. Please advise

## 2017-12-21 NOTE — Telephone Encounter (Signed)
Letrozole was d/c due to patient being on taxol. Will decline RF

## 2017-12-25 ENCOUNTER — Inpatient Hospital Stay: Payer: Medicare Other

## 2017-12-25 ENCOUNTER — Encounter: Payer: Self-pay | Admitting: Internal Medicine

## 2017-12-25 ENCOUNTER — Inpatient Hospital Stay (HOSPITAL_BASED_OUTPATIENT_CLINIC_OR_DEPARTMENT_OTHER): Payer: Medicare Other | Admitting: Internal Medicine

## 2017-12-25 VITALS — BP 138/88 | HR 73 | Temp 98.1°F | Resp 16 | Wt 192.6 lb

## 2017-12-25 DIAGNOSIS — Z79811 Long term (current) use of aromatase inhibitors: Secondary | ICD-10-CM

## 2017-12-25 DIAGNOSIS — C50212 Malignant neoplasm of upper-inner quadrant of left female breast: Secondary | ICD-10-CM | POA: Diagnosis not present

## 2017-12-25 DIAGNOSIS — C782 Secondary malignant neoplasm of pleura: Secondary | ICD-10-CM | POA: Diagnosis not present

## 2017-12-25 DIAGNOSIS — C771 Secondary and unspecified malignant neoplasm of intrathoracic lymph nodes: Secondary | ICD-10-CM

## 2017-12-25 DIAGNOSIS — Z9221 Personal history of antineoplastic chemotherapy: Secondary | ICD-10-CM | POA: Diagnosis not present

## 2017-12-25 DIAGNOSIS — Z170 Estrogen receptor positive status [ER+]: Principal | ICD-10-CM

## 2017-12-25 DIAGNOSIS — Z79899 Other long term (current) drug therapy: Secondary | ICD-10-CM

## 2017-12-25 DIAGNOSIS — J45909 Unspecified asthma, uncomplicated: Secondary | ICD-10-CM

## 2017-12-25 DIAGNOSIS — C7951 Secondary malignant neoplasm of bone: Secondary | ICD-10-CM | POA: Diagnosis not present

## 2017-12-25 DIAGNOSIS — Z794 Long term (current) use of insulin: Secondary | ICD-10-CM

## 2017-12-25 DIAGNOSIS — E1122 Type 2 diabetes mellitus with diabetic chronic kidney disease: Secondary | ICD-10-CM

## 2017-12-25 DIAGNOSIS — R5381 Other malaise: Secondary | ICD-10-CM

## 2017-12-25 DIAGNOSIS — Z87891 Personal history of nicotine dependence: Secondary | ICD-10-CM

## 2017-12-25 DIAGNOSIS — Z5111 Encounter for antineoplastic chemotherapy: Secondary | ICD-10-CM | POA: Diagnosis not present

## 2017-12-25 DIAGNOSIS — Z8041 Family history of malignant neoplasm of ovary: Secondary | ICD-10-CM

## 2017-12-25 DIAGNOSIS — I509 Heart failure, unspecified: Secondary | ICD-10-CM

## 2017-12-25 DIAGNOSIS — D509 Iron deficiency anemia, unspecified: Secondary | ICD-10-CM

## 2017-12-25 DIAGNOSIS — R5383 Other fatigue: Secondary | ICD-10-CM

## 2017-12-25 DIAGNOSIS — Z8673 Personal history of transient ischemic attack (TIA), and cerebral infarction without residual deficits: Secondary | ICD-10-CM

## 2017-12-25 DIAGNOSIS — I130 Hypertensive heart and chronic kidney disease with heart failure and stage 1 through stage 4 chronic kidney disease, or unspecified chronic kidney disease: Secondary | ICD-10-CM

## 2017-12-25 DIAGNOSIS — N184 Chronic kidney disease, stage 4 (severe): Secondary | ICD-10-CM

## 2017-12-25 DIAGNOSIS — D631 Anemia in chronic kidney disease: Secondary | ICD-10-CM

## 2017-12-25 DIAGNOSIS — Z7982 Long term (current) use of aspirin: Secondary | ICD-10-CM

## 2017-12-25 DIAGNOSIS — I700 Atherosclerosis of aorta: Secondary | ICD-10-CM

## 2017-12-25 DIAGNOSIS — I252 Old myocardial infarction: Secondary | ICD-10-CM

## 2017-12-25 LAB — COMPREHENSIVE METABOLIC PANEL
ALBUMIN: 3.6 g/dL (ref 3.5–5.0)
ALT: 28 U/L (ref 14–54)
AST: 42 U/L — AB (ref 15–41)
Alkaline Phosphatase: 76 U/L (ref 38–126)
Anion gap: 10 (ref 5–15)
BUN: 40 mg/dL — AB (ref 6–20)
CHLORIDE: 104 mmol/L (ref 101–111)
CO2: 21 mmol/L — ABNORMAL LOW (ref 22–32)
Calcium: 9.1 mg/dL (ref 8.9–10.3)
Creatinine, Ser: 2.86 mg/dL — ABNORMAL HIGH (ref 0.44–1.00)
GFR calc Af Amer: 20 mL/min — ABNORMAL LOW (ref >60)
GFR calc non Af Amer: 17 mL/min — ABNORMAL LOW (ref >60)
GLUCOSE: 91 mg/dL (ref 65–99)
POTASSIUM: 4.2 mmol/L (ref 3.5–5.1)
SODIUM: 135 mmol/L (ref 135–145)
Total Bilirubin: 0.9 mg/dL (ref 0.3–1.2)
Total Protein: 7.7 g/dL (ref 6.5–8.1)

## 2017-12-25 LAB — CBC WITH DIFFERENTIAL/PLATELET
Basophils Absolute: 0 10*3/uL (ref 0–0.1)
Basophils Relative: 1 %
EOS PCT: 0 %
Eosinophils Absolute: 0 10*3/uL (ref 0–0.7)
HEMATOCRIT: 25.8 % — AB (ref 35.0–47.0)
Hemoglobin: 9 g/dL — ABNORMAL LOW (ref 12.0–16.0)
LYMPHS ABS: 0.9 10*3/uL — AB (ref 1.0–3.6)
Lymphocytes Relative: 13 %
MCH: 34.2 pg — AB (ref 26.0–34.0)
MCHC: 34.9 g/dL (ref 32.0–36.0)
MCV: 98.1 fL (ref 80.0–100.0)
MONO ABS: 0.5 10*3/uL (ref 0.2–0.9)
Monocytes Relative: 6 %
Neutro Abs: 5.7 10*3/uL (ref 1.4–6.5)
Neutrophils Relative %: 80 %
PLATELETS: 413 10*3/uL (ref 150–440)
RBC: 2.63 MIL/uL — AB (ref 3.80–5.20)
RDW: 15.5 % — AB (ref 11.5–14.5)
WBC: 7.2 10*3/uL (ref 3.6–11.0)

## 2017-12-25 MED ORDER — FAMOTIDINE IN NACL 20-0.9 MG/50ML-% IV SOLN
20.0000 mg | Freq: Once | INTRAVENOUS | Status: AC
  Administered 2017-12-25: 20 mg via INTRAVENOUS
  Filled 2017-12-25: qty 50

## 2017-12-25 MED ORDER — HEPARIN SOD (PORK) LOCK FLUSH 100 UNIT/ML IV SOLN
500.0000 [IU] | Freq: Once | INTRAVENOUS | Status: AC
  Administered 2017-12-25: 500 [IU] via INTRAVENOUS
  Filled 2017-12-25: qty 5

## 2017-12-25 MED ORDER — SODIUM CHLORIDE 0.9% FLUSH
10.0000 mL | INTRAVENOUS | Status: DC | PRN
  Administered 2017-12-25: 10 mL via INTRAVENOUS
  Filled 2017-12-25: qty 10

## 2017-12-25 MED ORDER — SODIUM CHLORIDE 0.9 % IV SOLN: 8.0000 mg | Freq: Once | INTRAVENOUS | Status: DC

## 2017-12-25 MED ORDER — SODIUM CHLORIDE 0.9 % IV SOLN
80.0000 mg/m2 | Freq: Once | INTRAVENOUS | Status: AC
  Administered 2017-12-25: 156 mg via INTRAVENOUS
  Filled 2017-12-25: qty 26

## 2017-12-25 MED ORDER — SODIUM CHLORIDE 0.9 % IV SOLN
Freq: Once | INTRAVENOUS | Status: AC
  Administered 2017-12-25: 11:00:00 via INTRAVENOUS
  Filled 2017-12-25: qty 1000

## 2017-12-25 MED ORDER — SODIUM CHLORIDE 0.9% FLUSH
10.0000 mL | INTRAVENOUS | Status: DC | PRN
  Filled 2017-12-25: qty 10

## 2017-12-25 MED ORDER — DEXAMETHASONE SODIUM PHOSPHATE 10 MG/ML IJ SOLN
8.0000 mg | Freq: Once | INTRAMUSCULAR | Status: AC
  Administered 2017-12-25: 8 mg via INTRAVENOUS
  Filled 2017-12-25: qty 1

## 2017-12-25 MED ORDER — HEPARIN SOD (PORK) LOCK FLUSH 100 UNIT/ML IV SOLN: 500.0000 [IU] | Freq: Once | INTRAVENOUS | Status: DC | PRN

## 2017-12-25 MED ORDER — DIPHENHYDRAMINE HCL 50 MG/ML IJ SOLN
50.0000 mg | Freq: Once | INTRAMUSCULAR | Status: AC
  Administered 2017-12-25: 50 mg via INTRAVENOUS
  Filled 2017-12-25: qty 1

## 2017-12-25 NOTE — Assessment & Plan Note (Addendum)
#  Left breast cancer-stage IV ER PR positive HER-2/neu negative; PET scan April 2019 progressive pleural mets/rising tumor marker. Currently on Taxol.  Clinically stable  #Proceed with cycle number 2-day 15 today. Labs today reviewed;  acceptable for treatment today; creatinine 3.8.  #Chronic kidney disease stage IV-creatinine 3.8 stable.  Continue follow-up with nephrology.  #Anemia chronic kidney disease/chemotherapy hemoglobin 8.5.  Monitor closely.  # follow up in 2 weeks/labs-Taxol; Ca-27-29.

## 2017-12-25 NOTE — Progress Notes (Signed)
[place] OFFICE PROGRESS NOTE  Patient Care Team: [name], [name]  Cancer Staging No matching staging information was found for the patient.   Oncology History   # OCT 2015-STAGE IV LEFT BREAST T2N1 [T=4cm; [name]; [name]; [name]; EBUS- Positive Paratrac/subcarinal LN s/p ? Taxotere [in [place]; [name]; [name];[name]; [name]; suv 3]; [name]; [name]; [name];  [name]; Ant R iliac bone; [name]; [name]; START-Taxol weekly.   # Poorly controlled Blood sugars- improved.   # Pancreatitis Hx/ CKD IV [creat ~ 3-4; [name]; [name]; mild left sided weakness]  DIAGNOSIS: [ 2015] BREAST CA; [name]; [name]    ;[name], estrogen receptor positive ([place])      INTERVAL HISTORY:  Siearra A Llorente 62 y.o.  female pleasant patient above history of metastatic breast cancer is here for follow-up.  Patient complains of mild fatigue.  Otherwise no new cough or shortness of breath.  Denies any significant tingling or numbness.  No nausea no vomiting.  Review of Systems  Constitutional: Positive for malaise/fatigue. Negative for chills, diaphoresis, [name], hemoptysis,  sputum production, [name], palpitations, orthopnea and leg swelling.  Gastrointestinal: Negative for abdominal pain, blood in stool, constipation, diarrhea, heartburn, melena, nausea and vomiting.  Genitourinary: Negative for dysuria, frequency and urgency.  Musculoskeletal: Negative for back pain and joint pain.  Skin: Negative.  Negative for itching and rash.  Neurological: Negative for dizziness, tingling, focal weakness, weakness and headaches.  Endo/Heme/Allergies: Does not bruise/bleed easily.  Psychiatric/Behavioral: Negative for depression. The patient is not nervous/anxious and does not have insomnia.       PAST MEDICAL HISTORY :  Past Medical History:  Diagnosis Date  . Asthma   . CHF (congestive heart failure) ([place]) 1997  . CKD (chronic kidney disease)   . Depression   . Diabetes mellitus, type 2 ([place])   . Hair loss   . History of left breast cancer 05/29/14  . History of partial hysterectomy 12/31/2016   Per patient.  Has not had a period in years.  Had a partial hysterectomy years ago.  . Obesity   . Pancreatitis 1997  . Stroke [facility]) 2010   with mild left arm weakness    PAST SURGICAL HISTORY :   Past Surgical History:  Procedure Laterality Date  . CESAREAN SECTION    . CHOLECYSTECTOMY    . PARTIAL HYSTERECTOMY  12/31/2016   Per patient, she has not had a period in years since she had a partial hysterectomy.    FAMILY HISTORY :   Family History  Problem Relation Age of Onset  . Ovarian cancer Mother   . Diabetes Mother   .  Hypertension Mother   . COPD Father   . Hypertension Father   . Diabetes Sister   . Diabetes Brother     SOCIAL HISTORY:   Social History   Tobacco Use  . Smoking status: Former Smoker    Packs/day: 0.50    Years: 1.00    Pack years: 0.50    Types: Cigarettes  . Smokeless tobacco: Never Used  Substance Use Topics  . Alcohol use: No    Alcohol/week: 0.0 oz   . Drug use: No    ALLERGIES:  has No Known Allergies.  MEDICATIONS:  Current Outpatient Medications  Medication Sig Dispense Refill  . acetaminophen-codeine (TYLENOL #4) 300-60 MG tablet Take 2 tablets by mouth every 6 (six) hours as needed for moderate pain.     Marland Kitchen albuterol (PROAIR HFA) 108 (90 BASE) MCG/ACT inhaler Inhale 1 puff into the lungs every 6 (six) hours as needed for shortness of breath.     Marland Kitchen albuterol (PROVENTIL) (2.5 MG/3ML) 0.083% nebulizer solution Inhale 2.5 mg into the lungs every 6 (six) hours as needed for shortness of breath.     . ALPRAZolam (XANAX) 0.5 MG tablet Take 0.5 mg by mouth at bedtime as needed for anxiety or sleep.     Marland Kitchen amLODipine (NORVASC) 10 MG tablet Take 10 mg by mouth daily.     Marland Kitchen aspirin EC 81 MG tablet Take 81 mg by mouth once.     Marland Kitchen atenolol (TENORMIN) 100 MG tablet TAKE 1 TABLETS BY MOUTH TWICE DAILY    . B-D ULTRA-FINE 33 LANCETS MISC Use 1 each 2 (two) times daily.    . B-D ULTRAFINE III SHORT PEN 31G X 8 MM MISC     . bumetanide (BUMEX) 0.5 MG tablet TAKE 1 TABLET BY MOUTH TWICE DAILY    . calcitRIOL (ROCALTROL) 0.25 MCG capsule Take 0.25 mcg by mouth 3 (three) times a week.    . Cinnamon 500 MG capsule Take 500 mg by mouth daily.     . cloNIDine (CATAPRES) 0.2 MG tablet Take 0.2 mg by mouth 2 (two) times daily.     . enalapril (VASOTEC) 20 MG tablet Take 20 mg by mouth 2 (two) times daily.     . ferrous sulfate 325 (65 FE) MG tablet Take 325 mg by mouth 2 (two) times daily with a meal.    . FLUoxetine (PROZAC) 20 MG capsule Take 20 mg by mouth 2 (two) times daily.     Marland Kitchen glucose blood (ONE TOUCH ULTRA TEST) test strip Use 1 each 2 (two) times daily. Use as instructed.    . glyBURIDE (DIABETA) 5 MG tablet Take 5 mg by mouth daily with breakfast.     . LEVEMIR FLEXTOUCH 100 UNIT/ML Pen Inject 55 Units into the skin daily.     . metoprolol tartrate (LOPRESSOR) 25 MG tablet TAKE 1 TABLET BY MOUTH TWICE DAILY 60 tablet 0  . mometasone (NASONEX)  50 MCG/ACT nasal spray Place 2 sprays into the nose daily as needed.     Marland Kitchen NOVOLOG FLEXPEN 100 UNIT/ML FlexPen Inject 7 Units into the skin 2 (two) times daily at 8 am and 10 pm.     . ondansetron (ZOFRAN) 8 MG tablet One pill every 8 hours as needed for nausea/vomitting. 40 tablet 1  . prochlorperazine (COMPAZINE) 10 MG tablet Take 1 tablet (10 mg total) by mouth every 6 (six) hours as needed for nausea or vomiting. Please note change in strength 60 tablet 0  .  salmeterol (SEREVENT) 50 MCG/DOSE diskus inhaler Inhale 1 puff into the lungs 2 (two) times daily.    . simvastatin (ZOCOR) 20 MG tablet Take 20 mg by mouth daily at 6 PM.     . vitamin B-12 (CYANOCOBALAMIN) 1000 MCG tablet Take 1,000 mcg by mouth daily.     No current facility-administered medications for this visit.    Facility-Administered Medications Ordered in Other Visits  Medication Dose Route Frequency Provider Last Rate Last Dose  . sodium chloride flush (NS) 0.9 % injection 10 mL  10 mL Intravenous PRN Cammie Sickle, MD   10 mL at 01/30/16 1054    PHYSICAL EXAMINATION: ECOG PERFORMANCE STATUS: 1 - Symptomatic but completely ambulatory  BP 138/88 (BP Location: Left Arm, Patient Position: Sitting)   Pulse 73   Temp 98.1 F (36.7 C) (Oral)   Resp 16   Wt 192 lb 9.6 oz (87.4 kg)   BMI 35.23 kg/m   Filed Weights   12/25/17 0957  Weight: 192 lb 9.6 oz (87.4 kg)    GENERAL: Well-nourished well-developed; Alert, no distress and comfortable.  Alone. EYES: no pallor or icterus OROPHARYNX: no thrush or ulceration; NECK: supple; no lymph nodes felt. LYMPH:  no palpable lymphadenopathy in the axillary or inguinal regions LUNGS: Decreased breath sounds auscultation bilaterally. No wheeze or crackles HEART/CVS: regular rate & rhythm and no murmurs; No lower extremity edema ABDOMEN:abdomen soft, non-tender and normal bowel sounds. No hepatomegaly or splenomegaly.  Musculoskeletal:no cyanosis of digits and no clubbing   PSYCH: alert & oriented x 3 with fluent speech NEURO: no focal motor/sensory deficits SKIN:  no rashes or significant lesions    LABORATORY DATA:  I have reviewed the data as listed    Component Value Date/Time   NA 135 12/25/2017 0917   NA 130 (L) 06/06/2014 1102   K 4.2 12/25/2017 0917   K 3.9 06/06/2014 1102   CL 104 12/25/2017 0917   CL 95 (L) 06/06/2014 1102   CO2 21 (L) 12/25/2017 0917   CO2 28 06/06/2014 1102   GLUCOSE 91 12/25/2017 0917   GLUCOSE 349 (H) 06/06/2014 1102   BUN 40 (H) 12/25/2017 0917   BUN 17 06/06/2014 1102   CREATININE 2.86 (H) 12/25/2017 0917   CREATININE 1.63 (H) 06/06/2014 1102   CALCIUM 9.1 12/25/2017 0917   CALCIUM 9.2 06/06/2014 1102   PROT 7.7 12/25/2017 0917   PROT 8.2 06/06/2014 1102   ALBUMIN 3.6 12/25/2017 0917   ALBUMIN 3.3 (L) 06/06/2014 1102   AST 42 (H) 12/25/2017 0917   AST 7 (L) 06/06/2014 1102   ALT 28 12/25/2017 0917   ALT 12 (L) 06/06/2014 1102   ALKPHOS 76 12/25/2017 0917   ALKPHOS 74 06/06/2014 1102   BILITOT 0.9 12/25/2017 0917   BILITOT 0.4 06/06/2014 1102   GFRNONAA 17 (L) 12/25/2017 0917   GFRNONAA 35 (L) 06/06/2014 1102   GFRAA 20 (L) 12/25/2017 0917   GFRAA 42 (L) 06/06/2014 1102    No results found for: SPEP, UPEP  Lab Results  Component Value Date   WBC 7.2 12/25/2017   NEUTROABS 5.7 12/25/2017   HGB 9.0 (L) 12/25/2017   HCT 25.8 (L) 12/25/2017   MCV 98.1 12/25/2017   PLT 413 12/25/2017      Chemistry      Component Value Date/Time   NA 135 12/25/2017 0917   NA 130 (L) 06/06/2014 1102   K 4.2 12/25/2017 0917   K 3.9 06/06/2014 1102   CL 104 12/25/2017  0917   CL 95 (L) 06/06/2014 1102   CO2 21 (L) 12/25/2017 0917   CO2 28 06/06/2014 1102   BUN 40 (H) 12/25/2017 0917   BUN 17 06/06/2014 1102   CREATININE 2.86 (H) 12/25/2017 0917   CREATININE 1.63 (H) 06/06/2014 1102      Component Value Date/Time   CALCIUM 9.1 12/25/2017 0917   CALCIUM 9.2 06/06/2014 1102   ALKPHOS 76 12/25/2017 0917    ALKPHOS 74 06/06/2014 1102   AST 42 (H) 12/25/2017 0917   AST 7 (L) 06/06/2014 1102   ALT 28 12/25/2017 0917   ALT 12 (L) 06/06/2014 1102   BILITOT 0.9 12/25/2017 0917   BILITOT 0.4 06/06/2014 1102       RADIOGRAPHIC STUDIES: I have personally reviewed the radiological images as listed and agreed with the findings in the report. No results found.   ASSESSMENT & PLAN:  Carcinoma of upper-inner quadrant of left breast in female, estrogen receptor positive ([place]) #Left breast cancer-stage IV ER PR positive HER-2/neu negative; PET scan April 2019 progressive pleural mets/rising tumor marker. Currently on Taxol.  Clinically stable  #Proceed with cycle number 2-day 15 today. Labs today reviewed;  acceptable for treatment today; creatinine 3.8.  #Chronic kidney disease stage IV-creatinine 3.8 stable.  Continue follow-up with nephrology.  #Anemia chronic kidney disease/chemotherapy hemoglobin 8.5.  Monitor closely.  # follow up in 2 weeks/labs-Taxol; Ca-27-29.    Orders Placed This Encounter  Procedures  . CBC with Differential/Platelet    Standing Status:   Future    Standing Expiration Date:   12/26/2018  . Comprehensive metabolic panel    Standing Status:   Future    Standing Expiration Date:   12/26/2018   All questions were answered. The patient knows to call the clinic with any problems, questions or concerns.      Cammie Sickle, MD 12/28/2017 7:46 AM

## 2017-12-26 LAB — CANCER ANTIGEN 27.29: CAN 27.29: 83.9 U/mL — AB (ref 0.0–38.6)

## 2018-01-05 ENCOUNTER — Other Ambulatory Visit: Payer: Self-pay | Admitting: *Deleted

## 2018-01-05 MED ORDER — PROCHLORPERAZINE MALEATE 10 MG PO TABS: 10.0000 mg | ORAL_TABLET | Freq: Four times a day (QID) | ORAL | 4 refills | Status: DC | PRN

## 2018-01-08 ENCOUNTER — Inpatient Hospital Stay: Payer: Medicare Other | Attending: Internal Medicine

## 2018-01-08 ENCOUNTER — Inpatient Hospital Stay (HOSPITAL_BASED_OUTPATIENT_CLINIC_OR_DEPARTMENT_OTHER): Payer: Medicare Other | Admitting: Nurse Practitioner

## 2018-01-08 ENCOUNTER — Inpatient Hospital Stay: Payer: Medicare Other

## 2018-01-08 ENCOUNTER — Encounter: Payer: Self-pay | Admitting: Oncology

## 2018-01-08 ENCOUNTER — Encounter: Payer: Self-pay | Admitting: Nurse Practitioner

## 2018-01-08 VITALS — BP 156/86 | HR 74 | Temp 97.1°F | Resp 18 | Wt 195.0 lb

## 2018-01-08 DIAGNOSIS — R5381 Other malaise: Secondary | ICD-10-CM

## 2018-01-08 DIAGNOSIS — C50212 Malignant neoplasm of upper-inner quadrant of left female breast: Secondary | ICD-10-CM

## 2018-01-08 DIAGNOSIS — Z794 Long term (current) use of insulin: Secondary | ICD-10-CM | POA: Insufficient documentation

## 2018-01-08 DIAGNOSIS — N184 Chronic kidney disease, stage 4 (severe): Secondary | ICD-10-CM

## 2018-01-08 DIAGNOSIS — Z5111 Encounter for antineoplastic chemotherapy: Secondary | ICD-10-CM | POA: Diagnosis not present

## 2018-01-08 DIAGNOSIS — D649 Anemia, unspecified: Secondary | ICD-10-CM

## 2018-01-08 DIAGNOSIS — Z87891 Personal history of nicotine dependence: Secondary | ICD-10-CM | POA: Insufficient documentation

## 2018-01-08 DIAGNOSIS — R5383 Other fatigue: Secondary | ICD-10-CM | POA: Diagnosis not present

## 2018-01-08 DIAGNOSIS — C771 Secondary and unspecified malignant neoplasm of intrathoracic lymph nodes: Secondary | ICD-10-CM

## 2018-01-08 DIAGNOSIS — C782 Secondary malignant neoplasm of pleura: Secondary | ICD-10-CM | POA: Diagnosis not present

## 2018-01-08 DIAGNOSIS — Z170 Estrogen receptor positive status [ER+]: Principal | ICD-10-CM

## 2018-01-08 DIAGNOSIS — Z8041 Family history of malignant neoplasm of ovary: Secondary | ICD-10-CM | POA: Diagnosis not present

## 2018-01-08 DIAGNOSIS — E1122 Type 2 diabetes mellitus with diabetic chronic kidney disease: Secondary | ICD-10-CM | POA: Insufficient documentation

## 2018-01-08 DIAGNOSIS — C7951 Secondary malignant neoplasm of bone: Secondary | ICD-10-CM | POA: Insufficient documentation

## 2018-01-08 DIAGNOSIS — Z79899 Other long term (current) drug therapy: Secondary | ICD-10-CM | POA: Insufficient documentation

## 2018-01-08 DIAGNOSIS — Z7982 Long term (current) use of aspirin: Secondary | ICD-10-CM | POA: Diagnosis not present

## 2018-01-08 DIAGNOSIS — T451X5S Adverse effect of antineoplastic and immunosuppressive drugs, sequela: Secondary | ICD-10-CM | POA: Insufficient documentation

## 2018-01-08 DIAGNOSIS — D6481 Anemia due to antineoplastic chemotherapy: Secondary | ICD-10-CM | POA: Insufficient documentation

## 2018-01-08 DIAGNOSIS — G620 Drug-induced polyneuropathy: Secondary | ICD-10-CM | POA: Insufficient documentation

## 2018-01-08 LAB — CBC WITH DIFFERENTIAL/PLATELET
BASOS ABS: 0.1 10*3/uL (ref 0–0.1)
BASOS PCT: 1 %
Eosinophils Absolute: 0.1 10*3/uL (ref 0–0.7)
Eosinophils Relative: 1 %
HCT: 26.9 % — ABNORMAL LOW (ref 35.0–47.0)
HEMOGLOBIN: 9.3 g/dL — AB (ref 12.0–16.0)
Lymphocytes Relative: 14 %
Lymphs Abs: 1.3 10*3/uL (ref 1.0–3.6)
MCH: 33.5 pg (ref 26.0–34.0)
MCHC: 34.7 g/dL (ref 32.0–36.0)
MCV: 96.5 fL (ref 80.0–100.0)
Monocytes Absolute: 1.1 10*3/uL — ABNORMAL HIGH (ref 0.2–0.9)
Monocytes Relative: 13 %
NEUTROS PCT: 71 %
Neutro Abs: 6.3 10*3/uL (ref 1.4–6.5)
Platelets: 331 10*3/uL (ref 150–440)
RBC: 2.79 MIL/uL — AB (ref 3.80–5.20)
RDW: 15.7 % — ABNORMAL HIGH (ref 11.5–14.5)
WBC: 8.8 10*3/uL (ref 3.6–11.0)

## 2018-01-08 LAB — COMPREHENSIVE METABOLIC PANEL
ALK PHOS: 87 U/L (ref 38–126)
ALT: 15 U/L (ref 14–54)
ANION GAP: 9 (ref 5–15)
AST: 19 U/L (ref 15–41)
Albumin: 3.6 g/dL (ref 3.5–5.0)
BILIRUBIN TOTAL: 0.6 mg/dL (ref 0.3–1.2)
BUN: 32 mg/dL — ABNORMAL HIGH (ref 6–20)
CALCIUM: 9.1 mg/dL (ref 8.9–10.3)
CO2: 22 mmol/L (ref 22–32)
Chloride: 105 mmol/L (ref 101–111)
Creatinine, Ser: 2.94 mg/dL — ABNORMAL HIGH (ref 0.44–1.00)
GFR, EST AFRICAN AMERICAN: 19 mL/min — AB (ref >60)
GFR, EST NON AFRICAN AMERICAN: 16 mL/min — AB (ref >60)
Glucose, Bld: 101 mg/dL — ABNORMAL HIGH (ref 65–99)
Potassium: 4.6 mmol/L (ref 3.5–5.1)
SODIUM: 136 mmol/L (ref 135–145)
TOTAL PROTEIN: 7.3 g/dL (ref 6.5–8.1)

## 2018-01-08 MED ORDER — HEPARIN SOD (PORK) LOCK FLUSH 100 UNIT/ML IV SOLN: 500.0000 [IU] | Freq: Once | INTRAVENOUS | Status: DC | PRN

## 2018-01-08 MED ORDER — FAMOTIDINE IN NACL 20-0.9 MG/50ML-% IV SOLN
20.0000 mg | Freq: Once | INTRAVENOUS | Status: AC
  Administered 2018-01-08: 20 mg via INTRAVENOUS
  Filled 2018-01-08: qty 50

## 2018-01-08 MED ORDER — SODIUM CHLORIDE 0.9 % IV SOLN
Freq: Once | INTRAVENOUS | Status: AC
  Administered 2018-01-08: 10:00:00 via INTRAVENOUS
  Filled 2018-01-08: qty 1000

## 2018-01-08 MED ORDER — DIPHENHYDRAMINE HCL 50 MG/ML IJ SOLN
50.0000 mg | Freq: Once | INTRAMUSCULAR | Status: AC
  Administered 2018-01-08: 50 mg via INTRAVENOUS
  Filled 2018-01-08: qty 1

## 2018-01-08 MED ORDER — DEXAMETHASONE SODIUM PHOSPHATE 10 MG/ML IJ SOLN
8.0000 mg | Freq: Once | INTRAMUSCULAR | Status: AC
  Administered 2018-01-08: 8 mg via INTRAVENOUS
  Filled 2018-01-08: qty 1

## 2018-01-08 MED ORDER — SODIUM CHLORIDE 0.9% FLUSH
10.0000 mL | Freq: Once | INTRAVENOUS | Status: AC
  Administered 2018-01-08: 10 mL via INTRAVENOUS
  Filled 2018-01-08: qty 10

## 2018-01-08 MED ORDER — HEPARIN SOD (PORK) LOCK FLUSH 100 UNIT/ML IV SOLN
500.0000 [IU] | Freq: Once | INTRAVENOUS | Status: AC
  Administered 2018-01-08: 500 [IU] via INTRAVENOUS
  Filled 2018-01-08: qty 5

## 2018-01-08 MED ORDER — SODIUM CHLORIDE 0.9 % IV SOLN
80.0000 mg/m2 | Freq: Once | INTRAVENOUS | Status: AC
  Administered 2018-01-08: 156 mg via INTRAVENOUS
  Filled 2018-01-08: qty 26

## 2018-01-08 NOTE — Assessment & Plan Note (Signed)
Left breast cancer- stage IV- ER/PR +, HER-2/neu -; PET scan on 11/2017 showed progressive pleural mets and rising tumor marker at that time. Clinically, she was stable. Ca 27-29, now decreased 83.9 (12/25/17). Currently on taxol chemotherapy given with palliative intent. Labs today acceptable for treatment. Clinically she feels well.   Chronic kidney disease - stage IV- creatinine 2.94. Stable. Continue to follow up with nephrology.   Type 2 DM- insulin dependent- again discussed close and continued monitoring of blood sugars given steroids as pre-medications. Glucose today 101 exhibiting good control.   Anemia- Hmg today 9.3. Stable. Likely multifactoral d/t anemia of chronic disease and chemotherapy. Normocytic. Continue to monitor.   RTC in 1 week for labs (cbc, cmp, ca 27-29) and consideration of palliative taxol chemotherapy with Dr. Rogue Bussing or myself as covering NP.

## 2018-01-08 NOTE — Progress Notes (Signed)
[place] OFFICE PROGRESS NOTE  Patient Care Team: [name], [name], [name]; [name]; [name]; [name]; EBUS- Positive Paratrac/subcarinal LN s/p ? Taxotere [in [place]; [name]; [name];[name]; [name]; suv 3]; [name]; [name]; [name];  [name]; Ant R iliac bone; [name]; [name]; START-Taxol weekly.   # Poorly controlled Blood sugars- improved.   # Pancreatitis Hx/ CKD IV [creat ~ 3-4; [name]; [name]; mild left sided weakness]  DIAGNOSIS: [ 2015] BREAST CA; [name]; [name]    ;[name], estrogen receptor positive ([place])      INTERVAL HISTORY:  [name] 62 y.o.  female pleasant patient above history of metastatic breast cancer who returns to clinic today for follow-up and assessment prior to chemotherapy. She continues to complain of mild fatigue which has gradually worsened with chemotherapy. She continues to try to be active to improve fatigue and eat regularly. She does not feel this significantly interferes with her ADLs but is somewhat bothersome. Otherwise, she denies specific  complaints, including no new cough or shortness of breath. She denies any numbness or tingling. She denies nausea or vomiting.    Review of Systems  Constitutional: Positive for malaise/fatigue. Negative for chills, diaphoresis, [name], hemoptysis, sputum production, [name], palpitations, orthopnea and leg swelling.  Gastrointestinal: Negative for abdominal pain, blood in stool, constipation, diarrhea, heartburn, melena, nausea and vomiting.  Genitourinary: Negative for dysuria, frequency and urgency.  Musculoskeletal: Negative for back pain and joint pain.  Skin: Negative.  Negative for itching and rash.  Neurological: Negative for dizziness, tingling, focal weakness, weakness and headaches.  Endo/Heme/Allergies: Does not bruise/bleed easily.  Psychiatric/Behavioral: Negative for depression. The patient is not nervous/anxious and does not have insomnia.       PAST MEDICAL HISTORY :  Past Medical History:  Diagnosis Date  . Asthma   . CHF (congestive heart failure) ([place]) 1997  . CKD (chronic kidney disease)   . Depression   . Diabetes mellitus, type 2 ([place])   . Hair loss   . History of left breast cancer 05/29/14  . History of partial hysterectomy 12/31/2016   Per patient.  Has not had a period in years.  Had a partial hysterectomy years ago.  . Obesity   . Pancreatitis 1997  . Stroke [facility]) 2010   with mild left arm weakness    PAST SURGICAL HISTORY :   Past Surgical History:  Procedure Laterality Date  . CESAREAN SECTION    .  CHOLECYSTECTOMY    . PARTIAL HYSTERECTOMY  12/31/2016   Per patient, she has not had a period in years since she had a partial hysterectomy.    FAMILY HISTORY :   Family History  Problem Relation Age of Onset  . Ovarian cancer Mother   . Diabetes  Mother   . Hypertension Mother   . COPD Father   . Hypertension Father   . Diabetes Sister   . Diabetes Brother     SOCIAL HISTORY:   Social History   Tobacco Use  . Smoking status: Former Smoker    Packs/day: 0.50    Years: 1.00    Pack years: 0.50    Types: Cigarettes  . Smokeless tobacco: Never Used  Substance Use Topics  . Alcohol use: No    Alcohol/week: 0.0 oz  . Drug use: No    ALLERGIES:  has No Known Allergies.  MEDICATIONS:  Current Outpatient Medications  Medication Sig Dispense Refill  . acetaminophen-codeine (TYLENOL #4) 300-60 MG tablet Take 2 tablets by mouth every 6 (six) hours as needed for moderate pain.     Marland Kitchen albuterol (PROAIR HFA) 108 (90 BASE) MCG/ACT inhaler Inhale 1 puff into the lungs every 6 (six) hours as needed for shortness of breath.     Marland Kitchen albuterol (PROVENTIL) (2.5 MG/3ML) 0.083% nebulizer solution Inhale 2.5 mg into the lungs every 6 (six) hours as needed for shortness of breath.     . ALPRAZolam (XANAX) 0.5 MG tablet Take 0.5 mg by mouth at bedtime as needed for anxiety or sleep.     Marland Kitchen amLODipine (NORVASC) 10 MG tablet Take 10 mg by mouth daily.     Marland Kitchen aspirin EC 81 MG tablet Take 81 mg by mouth once.     Marland Kitchen atenolol (TENORMIN) 100 MG tablet TAKE 1 TABLETS BY MOUTH TWICE DAILY    . B-D ULTRA-FINE 33 LANCETS MISC Use 1 each 2 (two) times daily.    . B-D ULTRAFINE III SHORT PEN 31G X 8 MM MISC     . bumetanide (BUMEX) 0.5 MG tablet TAKE 1 TABLET BY MOUTH TWICE DAILY    . calcitRIOL (ROCALTROL) 0.25 MCG capsule Take 0.25 mcg by mouth 3 (three) times a week.    . Cinnamon 500 MG capsule Take 500 mg by mouth daily.     . cloNIDine (CATAPRES) 0.2 MG tablet Take 0.2 mg by mouth 2 (two) times daily.     . enalapril (VASOTEC) 20 MG tablet Take 20 mg by mouth 2 (two) times daily.     . ferrous sulfate 325 (65 FE) MG tablet Take 325 mg by mouth 2 (two) times daily with a meal.    . FLUoxetine (PROZAC) 20 MG capsule Take 20 mg by mouth 2 (two) times  daily.     Marland Kitchen glucose blood (ONE TOUCH ULTRA TEST) test strip Use 1 each 2 (two) times daily. Use as instructed.    . glyBURIDE (DIABETA) 5 MG tablet Take 5 mg by mouth daily with breakfast.     . LEVEMIR FLEXTOUCH 100 UNIT/ML Pen Inject 55 Units into the skin daily.     . metoprolol tartrate (LOPRESSOR) 25 MG tablet TAKE 1 TABLET BY MOUTH TWICE DAILY 60 tablet 0  . mometasone (NASONEX) 50 MCG/ACT nasal spray Place 2 sprays into the nose daily as needed.     Marland Kitchen NOVOLOG FLEXPEN 100 UNIT/ML FlexPen Inject 7 Units into the skin 2 (two) times daily at 8 am and 10 pm.     .  ondansetron (ZOFRAN) 8 MG tablet One pill every 8 hours as needed for nausea/vomitting. 40 tablet 1  . prochlorperazine (COMPAZINE) 10 MG tablet Take 1 tablet (10 mg total) by mouth every 6 (six) hours as needed for nausea or vomiting. Please note change in strength 60 tablet 4  . salmeterol (SEREVENT) 50 MCG/DOSE diskus inhaler Inhale 1 puff into the lungs 2 (two) times daily.    . simvastatin (ZOCOR) 20 MG tablet Take 20 mg by mouth daily at 6 PM.     . vitamin B-12 (CYANOCOBALAMIN) 1000 MCG tablet Take 1,000 mcg by mouth daily.     No current facility-administered medications for this visit.    Facility-Administered Medications Ordered in Other Visits  Medication Dose Route Frequency Provider Last Rate Last Dose  . heparin lock flush 100 unit/mL  500 Units Intravenous Once Charlaine Dalton R, MD      . sodium chloride flush (NS) 0.9 % injection 10 mL  10 mL Intravenous PRN Cammie Sickle, MD   10 mL at 01/30/16 1054    PHYSICAL EXAMINATION: ECOG PERFORMANCE STATUS: 1 - Symptomatic but completely ambulatory  BP (!) 156/86 (BP Location: Right Arm, Patient Position: Sitting)   Pulse 74   Temp (!) 97.1 F (36.2 C) (Tympanic)   Resp 18   Wt 195 lb (88.5 kg)   SpO2 97%   BMI 35.67 kg/m   Filed Weights   01/08/18 0921  Weight: 195 lb (88.5 kg)    GENERAL: Well-nourished well-developed; Alert, no distress and  comfortable. Alone.   EYES: no pallor or icterus OROPHARYNX: no thrush or ulceration NECK: supple; no lymph nodes felt LYMPH: no palpable lymphadenopathy in the axillary or inguinal regions LUNGS: Clear to auscultation bilaterally. No wheeze or crackles HEART/CVS: regular rate & rhythm and no murmurs; No lower extremity edema ABDOMEN: Rounded abdomen, soft, non-tender and normal bowel sounds. No hepatomegaly or splenomegaly.  Musculoskeletal: no cyanosis of digits and no clubbing.  PSYCH: alert & oriented x 3 with fluent speech NEURO: no focal motor/sensory deficits SKIN: no rashes or significant lesions   LABORATORY DATA:  I have reviewed the data as listed    Component Value Date/Time   NA 136 01/08/2018 0831   NA 130 (L) 06/06/2014 1102   K 4.6 01/08/2018 0831   K 3.9 06/06/2014 1102   CL 105 01/08/2018 0831   CL 95 (L) 06/06/2014 1102   CO2 22 01/08/2018 0831   CO2 28 06/06/2014 1102   GLUCOSE 101 (H) 01/08/2018 0831   GLUCOSE 349 (H) 06/06/2014 1102   BUN 32 (H) 01/08/2018 0831   BUN 17 06/06/2014 1102   CREATININE 2.94 (H) 01/08/2018 0831   CREATININE 1.63 (H) 06/06/2014 1102   CALCIUM 9.1 01/08/2018 0831   CALCIUM 9.2 06/06/2014 1102   PROT 7.3 01/08/2018 0831   PROT 8.2 06/06/2014 1102   ALBUMIN 3.6 01/08/2018 0831   ALBUMIN 3.3 (L) 06/06/2014 1102   AST 19 01/08/2018 0831   AST 7 (L) 06/06/2014 1102   ALT 15 01/08/2018 0831   ALT 12 (L) 06/06/2014 1102   ALKPHOS 87 01/08/2018 0831   ALKPHOS 74 06/06/2014 1102   BILITOT 0.6 01/08/2018 0831   BILITOT 0.4 06/06/2014 1102   GFRNONAA 16 (L) 01/08/2018 0831   GFRNONAA 35 (L) 06/06/2014 1102   GFRAA 19 (L) 01/08/2018 0831   GFRAA 42 (L) 06/06/2014 1102    No results found for: SPEP, UPEP  Lab Results  Component Value Date   WBC  8.8 01/08/2018   NEUTROABS 6.3 01/08/2018   HGB 9.3 (L) 01/08/2018   HCT 26.9 (L) 01/08/2018   MCV 96.5 01/08/2018   PLT 331 01/08/2018      Chemistry      Component Value  Date/Time   NA 136 01/08/2018 0831   NA 130 (L) 06/06/2014 1102   K 4.6 01/08/2018 0831   K 3.9 06/06/2014 1102   CL 105 01/08/2018 0831   CL 95 (L) 06/06/2014 1102   CO2 22 01/08/2018 0831   CO2 28 06/06/2014 1102   BUN 32 (H) 01/08/2018 0831   BUN 17 06/06/2014 1102   CREATININE 2.94 (H) 01/08/2018 0831   CREATININE 1.63 (H) 06/06/2014 1102      Component Value Date/Time   CALCIUM 9.1 01/08/2018 0831   CALCIUM 9.2 06/06/2014 1102   ALKPHOS 87 01/08/2018 0831   ALKPHOS 74 06/06/2014 1102   AST 19 01/08/2018 0831   AST 7 (L) 06/06/2014 1102   ALT 15 01/08/2018 0831   ALT 12 (L) 06/06/2014 1102   BILITOT 0.6 01/08/2018 0831   BILITOT 0.4 06/06/2014 1102       RADIOGRAPHIC STUDIES: I have personally reviewed the radiological images as listed and agreed with the findings in the report. No results found.   ASSESSMENT & PLAN:  Carcinoma of upper-inner quadrant of left breast in female, estrogen receptor positive ([place]) Left breast cancer- stage IV- ER/PR +, HER-2/neu -; PET scan on 11/2017 showed progressive pleural mets and rising tumor marker at that time. Clinically, she was stable. Ca 27-29, now decreased 83.9 (12/25/17). Currently on taxol chemotherapy given with palliative intent. Labs today acceptable for treatment. Clinically she feels well.   Chronic kidney disease - stage IV- creatinine 2.94. Stable. Continue to follow up with nephrology.   Type 2 DM- insulin dependent- again discussed close and continued monitoring of blood sugars given steroids as pre-medications. Glucose today 101 exhibiting good control.   Anemia- Hmg today 9.3. Stable. Likely multifactoral d/t anemia of chronic disease and chemotherapy. Normocytic. Continue to monitor.   RTC in 1 week for labs (cbc, cmp, ca 27-29) and consideration of palliative taxol chemotherapy with Dr. Rogue Bussing or myself as covering NP.    Orders Placed This Encounter  Procedures  . CBC with Differential/Platelet     Standing Status:   Future    Standing Expiration Date:   01/09/2019  . Comprehensive metabolic panel    Standing Status:   Future    Standing Expiration Date:   01/09/2019  . Cancer antigen 27.29    Standing Status:   Future    Standing Expiration Date:   01/09/2019   All questions were answered. The patient knows to call the clinic with any problems, questions or concerns.     Beckey Rutter, DNP, AGNP-C [place] [facility] [phone] (work cell) [phone] (office) 01/08/18 9:58 AM

## 2018-01-15 ENCOUNTER — Encounter: Payer: Self-pay | Admitting: Oncology

## 2018-01-15 ENCOUNTER — Other Ambulatory Visit: Payer: Self-pay

## 2018-01-15 ENCOUNTER — Inpatient Hospital Stay: Payer: Medicare Other

## 2018-01-15 ENCOUNTER — Inpatient Hospital Stay (HOSPITAL_BASED_OUTPATIENT_CLINIC_OR_DEPARTMENT_OTHER): Payer: Medicare Other | Admitting: Oncology

## 2018-01-15 VITALS — BP 131/82 | HR 80 | Temp 95.7°F | Resp 18 | Wt 192.9 lb

## 2018-01-15 DIAGNOSIS — C50212 Malignant neoplasm of upper-inner quadrant of left female breast: Secondary | ICD-10-CM | POA: Diagnosis not present

## 2018-01-15 DIAGNOSIS — R5383 Other fatigue: Secondary | ICD-10-CM | POA: Diagnosis not present

## 2018-01-15 DIAGNOSIS — Z170 Estrogen receptor positive status [ER+]: Secondary | ICD-10-CM

## 2018-01-15 DIAGNOSIS — N184 Chronic kidney disease, stage 4 (severe): Secondary | ICD-10-CM

## 2018-01-15 DIAGNOSIS — G620 Drug-induced polyneuropathy: Secondary | ICD-10-CM

## 2018-01-15 DIAGNOSIS — C50919 Malignant neoplasm of unspecified site of unspecified female breast: Secondary | ICD-10-CM

## 2018-01-15 DIAGNOSIS — D6481 Anemia due to antineoplastic chemotherapy: Secondary | ICD-10-CM

## 2018-01-15 DIAGNOSIS — C771 Secondary and unspecified malignant neoplasm of intrathoracic lymph nodes: Secondary | ICD-10-CM | POA: Diagnosis not present

## 2018-01-15 DIAGNOSIS — C7951 Secondary malignant neoplasm of bone: Secondary | ICD-10-CM

## 2018-01-15 DIAGNOSIS — Z8041 Family history of malignant neoplasm of ovary: Secondary | ICD-10-CM

## 2018-01-15 DIAGNOSIS — R5381 Other malaise: Secondary | ICD-10-CM | POA: Diagnosis not present

## 2018-01-15 DIAGNOSIS — C782 Secondary malignant neoplasm of pleura: Secondary | ICD-10-CM

## 2018-01-15 DIAGNOSIS — E1122 Type 2 diabetes mellitus with diabetic chronic kidney disease: Secondary | ICD-10-CM | POA: Diagnosis not present

## 2018-01-15 DIAGNOSIS — Z794 Long term (current) use of insulin: Secondary | ICD-10-CM

## 2018-01-15 DIAGNOSIS — Z5111 Encounter for antineoplastic chemotherapy: Secondary | ICD-10-CM

## 2018-01-15 DIAGNOSIS — T451X5S Adverse effect of antineoplastic and immunosuppressive drugs, sequela: Secondary | ICD-10-CM | POA: Diagnosis not present

## 2018-01-15 DIAGNOSIS — Z7982 Long term (current) use of aspirin: Secondary | ICD-10-CM

## 2018-01-15 DIAGNOSIS — Z87891 Personal history of nicotine dependence: Secondary | ICD-10-CM

## 2018-01-15 DIAGNOSIS — T451X5A Adverse effect of antineoplastic and immunosuppressive drugs, initial encounter: Secondary | ICD-10-CM

## 2018-01-15 DIAGNOSIS — D631 Anemia in chronic kidney disease: Secondary | ICD-10-CM

## 2018-01-15 DIAGNOSIS — Z79899 Other long term (current) drug therapy: Secondary | ICD-10-CM

## 2018-01-15 LAB — CBC WITH DIFFERENTIAL/PLATELET
BASOS ABS: 0.1 10*3/uL (ref 0–0.1)
Basophils Relative: 1 %
EOS PCT: 1 %
Eosinophils Absolute: 0.1 10*3/uL (ref 0–0.7)
HCT: 24.8 % — ABNORMAL LOW (ref 35.0–47.0)
Hemoglobin: 8.6 g/dL — ABNORMAL LOW (ref 12.0–16.0)
LYMPHS ABS: 0.9 10*3/uL — AB (ref 1.0–3.6)
LYMPHS PCT: 13 %
MCH: 33.1 pg (ref 26.0–34.0)
MCHC: 34.7 g/dL (ref 32.0–36.0)
MCV: 95.2 fL (ref 80.0–100.0)
MONO ABS: 0.6 10*3/uL (ref 0.2–0.9)
Monocytes Relative: 8 %
Neutro Abs: 5.4 10*3/uL (ref 1.4–6.5)
Neutrophils Relative %: 77 %
PLATELETS: 335 10*3/uL (ref 150–440)
RBC: 2.61 MIL/uL — ABNORMAL LOW (ref 3.80–5.20)
RDW: 16.5 % — AB (ref 11.5–14.5)
WBC: 7 10*3/uL (ref 3.6–11.0)

## 2018-01-15 LAB — COMPREHENSIVE METABOLIC PANEL
ALBUMIN: 3.5 g/dL (ref 3.5–5.0)
ALK PHOS: 98 U/L (ref 38–126)
ALT: 33 U/L (ref 14–54)
AST: 21 U/L (ref 15–41)
Anion gap: 8 (ref 5–15)
BUN: 50 mg/dL — AB (ref 6–20)
CO2: 21 mmol/L — ABNORMAL LOW (ref 22–32)
Calcium: 8.9 mg/dL (ref 8.9–10.3)
Chloride: 107 mmol/L (ref 101–111)
Creatinine, Ser: 2.85 mg/dL — ABNORMAL HIGH (ref 0.44–1.00)
GFR calc Af Amer: 20 mL/min — ABNORMAL LOW (ref >60)
GFR calc non Af Amer: 17 mL/min — ABNORMAL LOW (ref >60)
GLUCOSE: 91 mg/dL (ref 65–99)
POTASSIUM: 4.5 mmol/L (ref 3.5–5.1)
Sodium: 136 mmol/L (ref 135–145)
TOTAL PROTEIN: 7.1 g/dL (ref 6.5–8.1)
Total Bilirubin: 0.7 mg/dL (ref 0.3–1.2)

## 2018-01-15 MED ORDER — SODIUM CHLORIDE 0.9 % IV SOLN: 8.0000 mg | Freq: Once | INTRAVENOUS | Status: DC

## 2018-01-15 MED ORDER — SODIUM CHLORIDE 0.9 % IV SOLN
Freq: Once | INTRAVENOUS | Status: AC
  Administered 2018-01-15: 10:00:00 via INTRAVENOUS
  Filled 2018-01-15: qty 1000

## 2018-01-15 MED ORDER — HEPARIN SOD (PORK) LOCK FLUSH 100 UNIT/ML IV SOLN: 500.0000 [IU] | Freq: Once | INTRAVENOUS | Status: DC | PRN

## 2018-01-15 MED ORDER — DIPHENHYDRAMINE HCL 50 MG/ML IJ SOLN
50.0000 mg | Freq: Once | INTRAMUSCULAR | Status: AC
  Administered 2018-01-15: 50 mg via INTRAVENOUS
  Filled 2018-01-15: qty 1

## 2018-01-15 MED ORDER — DEXAMETHASONE SODIUM PHOSPHATE 10 MG/ML IJ SOLN
8.0000 mg | Freq: Once | INTRAMUSCULAR | Status: AC
  Administered 2018-01-15: 8 mg via INTRAVENOUS
  Filled 2018-01-15: qty 1

## 2018-01-15 MED ORDER — FAMOTIDINE IN NACL 20-0.9 MG/50ML-% IV SOLN
20.0000 mg | Freq: Once | INTRAVENOUS | Status: AC
Start: 2018-01-15 — End: 2018-01-15
  Administered 2018-01-15: 20 mg via INTRAVENOUS
  Filled 2018-01-15: qty 50

## 2018-01-15 MED ORDER — SODIUM CHLORIDE 0.9 % IV SOLN
70.0000 mg/m2 | Freq: Once | INTRAVENOUS | Status: AC
  Administered 2018-01-15: 138 mg via INTRAVENOUS
  Filled 2018-01-15: qty 23

## 2018-01-15 MED ORDER — SODIUM CHLORIDE 0.9% FLUSH
10.0000 mL | INTRAVENOUS | Status: DC | PRN
Start: 2018-01-15 — End: 2018-01-15
  Filled 2018-01-15: qty 10

## 2018-01-15 MED ORDER — HEPARIN SOD (PORK) LOCK FLUSH 100 UNIT/ML IV SOLN
500.0000 [IU] | Freq: Once | INTRAVENOUS | Status: AC
  Administered 2018-01-15: 500 [IU] via INTRAVENOUS
  Filled 2018-01-15: qty 5

## 2018-01-15 MED ORDER — SODIUM CHLORIDE 0.9% FLUSH
10.0000 mL | Freq: Once | INTRAVENOUS | Status: AC
  Administered 2018-01-15: 10 mL via INTRAVENOUS
  Filled 2018-01-15: qty 10

## 2018-01-15 NOTE — Progress Notes (Signed)
Patient here today for follow up. Requesting refill for acetaminophen-codeine and xanax. Also, complains of numbness and tingling to fingers.

## 2018-01-15 NOTE — Progress Notes (Signed)
[place] OFFICE PROGRESS NOTE  Patient Care Team: [name], [name], [name]; [name]; [name]; [name]; EBUS- Positive Paratrac/subcarinal LN s/p ? Taxotere [in [place]; [name]; [name];[name]; [name]; suv 3]; [name]; [name]; [name];  [name]; Ant R iliac bone; [name]; [name]; START-Taxol weekly.   # Poorly controlled Blood sugars- improved.   # Pancreatitis Hx/ CKD IV [creat ~ 3-4; [name]; [name]; mild left sided weakness]  DIAGNOSIS: [ 2015] BREAST CA; [name]; [name]    ;[name], estrogen receptor positive ([place])      INTERVAL HISTORY:  [name] 62 y.o.  female pleasant patient above history of metastatic breast cancer presents for assessment prior to chemotherapy treatment with Taxol.  I am covering Dr. B to see this patient today. Oncology history reviewed.  Patient has been on Taxol day 1, 8, 15 q. 28 days since April 2019.  Patient reports tolerating chemotherapy.  Denies any nausea vomiting fever or chills.  She mentioned numbness and tingling of fingertips, gradual onset, about  1 week ago, persistent, no aggravating factors or improving factors.  She does sometimes have difficulty buttoning shirts.   #Fatigue, chronic, no worse.  No better   Review of Systems  Constitutional: Positive for malaise/fatigue. Negative for chills, diaphoresis, [name], hemoptysis, sputum production, [name], palpitations, orthopnea and leg swelling.  Gastrointestinal: Negative for abdominal pain, blood in stool, constipation, diarrhea, heartburn, melena, nausea and vomiting.  Genitourinary: Negative for dysuria, frequency and urgency.  Musculoskeletal: Negative for back pain and joint pain.  Skin: Negative.  Negative for itching and rash.  Neurological: Positive for tingling and sensory change. Negative for dizziness, focal weakness, weakness and headaches.  Endo/Heme/Allergies: Does not bruise/bleed easily.  Psychiatric/Behavioral: Negative for depression. The patient is not nervous/anxious and does not have insomnia.       PAST MEDICAL HISTORY :  Past Medical History:  Diagnosis Date  . Asthma   . CHF (congestive heart failure) ([place]) 1997  . CKD (chronic kidney disease)   . Depression   . Diabetes mellitus, type 2 ([place])   . Hair loss   . History of left breast cancer 05/29/14  . History of partial hysterectomy 12/31/2016   Per patient.  Has not had a period in years.  Had a partial hysterectomy years ago.  . Obesity   . Pancreatitis 1997  . Stroke [facility]) 2010   with mild left arm weakness    PAST SURGICAL HISTORY :  Past Surgical History:  Procedure Laterality Date  . CESAREAN SECTION    . CHOLECYSTECTOMY    . PARTIAL HYSTERECTOMY  12/31/2016   Per patient, she has not had a period in years since she had a partial hysterectomy.    FAMILY HISTORY :   Family History   Problem Relation Age of Onset  . Ovarian cancer Mother   . Diabetes Mother   . Hypertension Mother   . COPD Father   . Hypertension Father   . Diabetes Sister   . Diabetes Brother     SOCIAL HISTORY:   Social History   Tobacco Use  . Smoking status: Former Smoker    Packs/day: 0.50    Years: 1.00    Pack years: 0.50    Types: Cigarettes  . Smokeless tobacco: Never Used  Substance Use Topics  . Alcohol use: No    Alcohol/week: 0.0 oz  . Drug use: No    ALLERGIES:  has No Known Allergies.  MEDICATIONS:  Current Outpatient Medications  Medication Sig Dispense Refill  . acetaminophen-codeine (TYLENOL #4) 300-60 MG tablet Take 2 tablets by mouth every 6 (six) hours as needed for moderate pain.     Marland Kitchen albuterol (PROAIR HFA) 108 (90 BASE) MCG/ACT inhaler Inhale 1 puff into the lungs every 6 (six) hours as needed for shortness of breath.     Marland Kitchen albuterol (PROVENTIL) (2.5 MG/3ML) 0.083% nebulizer solution Inhale 2.5 mg into the lungs every 6 (six) hours as needed for shortness of breath.     . ALPRAZolam (XANAX) 0.5 MG tablet Take 0.5 mg by mouth at bedtime as needed for anxiety or sleep.     Marland Kitchen amLODipine (NORVASC) 10 MG tablet Take 10 mg by mouth daily.     Marland Kitchen aspirin EC 81 MG tablet Take 81 mg by mouth once.     Marland Kitchen atenolol (TENORMIN) 100 MG tablet TAKE 1 TABLETS BY MOUTH TWICE DAILY    . B-D ULTRA-FINE 33 LANCETS MISC Use 1 each 2 (two) times daily.    . B-D ULTRAFINE III SHORT PEN 31G X 8 MM MISC     . bumetanide (BUMEX) 0.5 MG tablet TAKE 1 TABLET BY MOUTH TWICE DAILY    . calcitRIOL (ROCALTROL) 0.25 MCG capsule Take 0.25 mcg by mouth 3 (three) times a week.    . Cinnamon 500 MG capsule Take 500 mg by mouth daily.     . cloNIDine (CATAPRES) 0.2 MG tablet Take 0.2 mg by mouth 2 (two) times daily.     . enalapril (VASOTEC) 20 MG tablet Take 20 mg by mouth 2 (two) times daily.     . ferrous sulfate 325 (65 FE) MG tablet Take 325 mg by mouth 2 (two) times daily with a meal.    .  FLUoxetine (PROZAC) 20 MG capsule Take 20 mg by mouth 2 (two) times daily.     Marland Kitchen glucose blood (ONE TOUCH ULTRA TEST) test strip Use 1 each 2 (two) times daily. Use as instructed.    . glyBURIDE (DIABETA) 5 MG tablet Take 5 mg by mouth daily with breakfast.     . LEVEMIR FLEXTOUCH 100 UNIT/ML Pen Inject 55 Units into the skin daily.     . metoprolol tartrate (LOPRESSOR) 25 MG tablet TAKE 1 TABLET BY MOUTH TWICE DAILY 60 tablet 0  . mometasone (NASONEX) 50 MCG/ACT nasal spray Place 2 sprays into the nose daily as needed.     Marland Kitchen NOVOLOG FLEXPEN 100 UNIT/ML FlexPen Inject 7  Units into the skin 2 (two) times daily at 8 am and 10 pm.     . ondansetron (ZOFRAN) 8 MG tablet One pill every 8 hours as needed for nausea/vomitting. 40 tablet 1  . prochlorperazine (COMPAZINE) 10 MG tablet Take 1 tablet (10 mg total) by mouth every 6 (six) hours as needed for nausea or vomiting. Please note change in strength 60 tablet 4  . salmeterol (SEREVENT) 50 MCG/DOSE diskus inhaler Inhale 1 puff into the lungs 2 (two) times daily.    . simvastatin (ZOCOR) 20 MG tablet Take 20 mg by mouth daily at 6 PM.     . vitamin B-12 (CYANOCOBALAMIN) 1000 MCG tablet Take 1,000 mcg by mouth daily.     No current facility-administered medications for this visit.    Facility-Administered Medications Ordered in Other Visits  Medication Dose Route Frequency Provider Last Rate Last Dose  . heparin lock flush 100 unit/mL  500 Units Intravenous Once Charlaine Dalton R, MD      . sodium chloride flush (NS) 0.9 % injection 10 mL  10 mL Intravenous PRN Cammie Sickle, MD   10 mL at 01/30/16 1054    PHYSICAL EXAMINATION: ECOG PERFORMANCE STATUS: 1 - Symptomatic but completely ambulatory  BP 131/82 (BP Location: Right Arm, Patient Position: Sitting)   Pulse 80   Temp (!) 95.7 F (35.4 C) (Tympanic)   Resp 18   Wt 192 lb 14.4 oz (87.5 kg)   BMI 35.28 kg/m   Filed Weights   01/15/18 0945  Weight: 192 lb 14.4 oz (87.5 kg)    Physical Exam  Constitutional: She is oriented to person, place, and time and well-developed, well-nourished, and in no distress. No distress.  HENT:  Head: Normocephalic and atraumatic.  Nose: Nose normal.  Mouth/Throat: Oropharynx is clear and moist. No oropharyngeal exudate.  Eyes: Pupils are equal, round, and reactive to light. EOM are normal. Left eye exhibits no discharge. No scleral icterus.  Neck: Normal range of motion. Neck supple. No JVD present.  Cardiovascular: Normal rate, regular rhythm and normal heart sounds.  No murmur heard. Pulmonary/Chest: Effort normal. No respiratory distress. She has no wheezes. She has no rales. She exhibits no tenderness.    Decreased breath on bilaterally  Abdominal: Soft. She exhibits no distension and no mass. There is no tenderness. There is no rebound.  Musculoskeletal: Normal range of motion. She exhibits no edema or tenderness.  Lymphadenopathy:    She has no cervical adenopathy.  Neurological: She is alert and oriented to person, place, and time. No cranial nerve deficit. She exhibits normal muscle tone. Coordination normal.  Skin: Skin is warm and dry. No rash noted. She is not diaphoretic. No erythema.  Psychiatric: Affect and judgment normal.     LABORATORY DATA:  I have reviewed the data as listed    Component Value Date/Time   NA 136 01/15/2018 0930   NA 130 (L) 06/06/2014 1102   K 4.5 01/15/2018 0930   K 3.9 06/06/2014 1102   CL 107 01/15/2018 0930   CL 95 (L) 06/06/2014 1102   CO2 21 (L) 01/15/2018 0930   CO2 28 06/06/2014 1102   GLUCOSE 91 01/15/2018 0930   GLUCOSE 349 (H) 06/06/2014 1102   BUN 50 (H) 01/15/2018 0930   BUN 17 06/06/2014 1102   CREATININE 2.85 (H) 01/15/2018 0930   CREATININE 1.63 (H) 06/06/2014 1102   CALCIUM 8.9 01/15/2018 0930   CALCIUM 9.2 06/06/2014 1102   PROT 7.1 01/15/2018 0930  PROT 8.2 06/06/2014 1102   ALBUMIN 3.5 01/15/2018 0930   ALBUMIN 3.3 (L) 06/06/2014 1102   AST 21  01/15/2018 0930   AST 7 (L) 06/06/2014 1102   ALT 33 01/15/2018 0930   ALT 12 (L) 06/06/2014 1102   ALKPHOS 98 01/15/2018 0930   ALKPHOS 74 06/06/2014 1102   BILITOT 0.7 01/15/2018 0930   BILITOT 0.4 06/06/2014 1102   GFRNONAA 17 (L) 01/15/2018 0930   GFRNONAA 35 (L) 06/06/2014 1102   GFRAA 20 (L) 01/15/2018 0930   GFRAA 42 (L) 06/06/2014 1102    No results found for: SPEP, UPEP  Lab Results  Component Value Date   WBC 7.0 01/15/2018   NEUTROABS 5.4 01/15/2018   HGB 8.6 (L) 01/15/2018   HCT 24.8 (L) 01/15/2018   MCV 95.2 01/15/2018   PLT 335 01/15/2018      Chemistry      Component Value Date/Time   NA 136 01/15/2018 0930   NA 130 (L) 06/06/2014 1102   K 4.5 01/15/2018 0930   K 3.9 06/06/2014 1102   CL 107 01/15/2018 0930   CL 95 (L) 06/06/2014 1102   CO2 21 (L) 01/15/2018 0930   CO2 28 06/06/2014 1102   BUN 50 (H) 01/15/2018 0930   BUN 17 06/06/2014 1102   CREATININE 2.85 (H) 01/15/2018 0930   CREATININE 1.63 (H) 06/06/2014 1102      Component Value Date/Time   CALCIUM 8.9 01/15/2018 0930   CALCIUM 9.2 06/06/2014 1102   ALKPHOS 98 01/15/2018 0930   ALKPHOS 74 06/06/2014 1102   AST 21 01/15/2018 0930   AST 7 (L) 06/06/2014 1102   ALT 33 01/15/2018 0930   ALT 12 (L) 06/06/2014 1102   BILITOT 0.7 01/15/2018 0930   BILITOT 0.4 06/06/2014 1102       RADIOGRAPHIC STUDIES: I have personally reviewed the radiological images as listed and agreed with the findings in the report. No results found.   ASSESSMENT & PLAN:  1. Metastatic breast cancer ([place])   2. Encounter for antineoplastic chemotherapy   3. Neuropathy due to chemotherapeutic drug ([place])   4. Anemia due to antineoplastic chemotherapy    #Breast cancer: Tolerates chemotherapy well except neuropathy.  Proceed with today's Taxol with dose reduced to 70 mg/m.  See below #Neuropathy secondary to chemotherapy drugs.  Likely due to Taxol.  Patient has chronic kidney disease with a GFR less than 30.   Gabapentin and Cymbalta given.  Does reduce Taxol to 70 mg/m.  Discussed with patient about the dosage change she voices understanding.  #Anemia, multifactorial combination of anemia due to chronic kidney disease/antineoplastic treatment.   slightly lower than her baseline.  Continue to monitor.  No need for transfusion. #Chronic kidney disease, creatinine stable.  Patient can follow-up in 1 week for labs and assessment prior to next treatment of Taxol. All questions were answered. The patient knows to call the clinic with any problems, questions or concerns.      Earlie Server, MD 01/15/2018 9:50 AM

## 2018-01-16 LAB — CANCER ANTIGEN 27.29: CAN 27.29: 74.9 U/mL — AB (ref 0.0–38.6)

## 2018-01-22 ENCOUNTER — Other Ambulatory Visit: Payer: Self-pay

## 2018-01-22 ENCOUNTER — Inpatient Hospital Stay: Payer: Medicare Other

## 2018-01-22 ENCOUNTER — Inpatient Hospital Stay (HOSPITAL_BASED_OUTPATIENT_CLINIC_OR_DEPARTMENT_OTHER): Payer: Medicare Other | Admitting: Oncology

## 2018-01-22 ENCOUNTER — Encounter: Payer: Self-pay | Admitting: Oncology

## 2018-01-22 VITALS — BP 136/82 | HR 75 | Temp 97.2°F | Resp 20 | Ht 62.0 in | Wt 192.5 lb

## 2018-01-22 DIAGNOSIS — Z170 Estrogen receptor positive status [ER+]: Secondary | ICD-10-CM | POA: Diagnosis not present

## 2018-01-22 DIAGNOSIS — Z794 Long term (current) use of insulin: Secondary | ICD-10-CM

## 2018-01-22 DIAGNOSIS — T451X5S Adverse effect of antineoplastic and immunosuppressive drugs, sequela: Secondary | ICD-10-CM

## 2018-01-22 DIAGNOSIS — C50919 Malignant neoplasm of unspecified site of unspecified female breast: Secondary | ICD-10-CM

## 2018-01-22 DIAGNOSIS — D6481 Anemia due to antineoplastic chemotherapy: Secondary | ICD-10-CM

## 2018-01-22 DIAGNOSIS — G620 Drug-induced polyneuropathy: Secondary | ICD-10-CM | POA: Diagnosis not present

## 2018-01-22 DIAGNOSIS — C771 Secondary and unspecified malignant neoplasm of intrathoracic lymph nodes: Secondary | ICD-10-CM | POA: Diagnosis not present

## 2018-01-22 DIAGNOSIS — R5381 Other malaise: Secondary | ICD-10-CM | POA: Diagnosis not present

## 2018-01-22 DIAGNOSIS — Z8041 Family history of malignant neoplasm of ovary: Secondary | ICD-10-CM

## 2018-01-22 DIAGNOSIS — D631 Anemia in chronic kidney disease: Secondary | ICD-10-CM

## 2018-01-22 DIAGNOSIS — C782 Secondary malignant neoplasm of pleura: Secondary | ICD-10-CM | POA: Diagnosis not present

## 2018-01-22 DIAGNOSIS — R5383 Other fatigue: Secondary | ICD-10-CM

## 2018-01-22 DIAGNOSIS — N184 Chronic kidney disease, stage 4 (severe): Secondary | ICD-10-CM | POA: Diagnosis not present

## 2018-01-22 DIAGNOSIS — C7951 Secondary malignant neoplasm of bone: Secondary | ICD-10-CM

## 2018-01-22 DIAGNOSIS — Z5111 Encounter for antineoplastic chemotherapy: Secondary | ICD-10-CM

## 2018-01-22 DIAGNOSIS — Z87891 Personal history of nicotine dependence: Secondary | ICD-10-CM

## 2018-01-22 DIAGNOSIS — E1122 Type 2 diabetes mellitus with diabetic chronic kidney disease: Secondary | ICD-10-CM

## 2018-01-22 DIAGNOSIS — C50212 Malignant neoplasm of upper-inner quadrant of left female breast: Secondary | ICD-10-CM

## 2018-01-22 DIAGNOSIS — T451X5A Adverse effect of antineoplastic and immunosuppressive drugs, initial encounter: Secondary | ICD-10-CM

## 2018-01-22 DIAGNOSIS — Z7982 Long term (current) use of aspirin: Secondary | ICD-10-CM

## 2018-01-22 DIAGNOSIS — Z79899 Other long term (current) drug therapy: Secondary | ICD-10-CM

## 2018-01-22 LAB — CBC WITH DIFFERENTIAL/PLATELET
BASOS ABS: 0 10*3/uL (ref 0–0.1)
Basophils Relative: 1 %
EOS ABS: 0 10*3/uL (ref 0–0.7)
Eosinophils Relative: 0 %
HCT: 25.5 % — ABNORMAL LOW (ref 35.0–47.0)
HEMOGLOBIN: 8.7 g/dL — AB (ref 12.0–16.0)
Lymphocytes Relative: 8 %
Lymphs Abs: 0.7 10*3/uL — ABNORMAL LOW (ref 1.0–3.6)
MCH: 32.3 pg (ref 26.0–34.0)
MCHC: 34.1 g/dL (ref 32.0–36.0)
MCV: 94.8 fL (ref 80.0–100.0)
MONOS PCT: 5 %
Monocytes Absolute: 0.4 10*3/uL (ref 0.2–0.9)
NEUTROS PCT: 86 %
Neutro Abs: 7.1 10*3/uL — ABNORMAL HIGH (ref 1.4–6.5)
Platelets: 337 10*3/uL (ref 150–440)
RBC: 2.69 MIL/uL — ABNORMAL LOW (ref 3.80–5.20)
RDW: 16.1 % — ABNORMAL HIGH (ref 11.5–14.5)
WBC: 8.3 10*3/uL (ref 3.6–11.0)

## 2018-01-22 LAB — COMPREHENSIVE METABOLIC PANEL
ALK PHOS: 83 U/L (ref 38–126)
ALT: 19 U/L (ref 14–54)
ANION GAP: 7 (ref 5–15)
AST: 20 U/L (ref 15–41)
Albumin: 3.6 g/dL (ref 3.5–5.0)
BILIRUBIN TOTAL: 0.4 mg/dL (ref 0.3–1.2)
BUN: 42 mg/dL — ABNORMAL HIGH (ref 6–20)
CALCIUM: 9 mg/dL (ref 8.9–10.3)
CO2: 20 mmol/L — AB (ref 22–32)
CREATININE: 2.91 mg/dL — AB (ref 0.44–1.00)
Chloride: 108 mmol/L (ref 101–111)
GFR, EST AFRICAN AMERICAN: 19 mL/min — AB (ref >60)
GFR, EST NON AFRICAN AMERICAN: 16 mL/min — AB (ref >60)
Glucose, Bld: 135 mg/dL — ABNORMAL HIGH (ref 65–99)
Potassium: 4.3 mmol/L (ref 3.5–5.1)
Sodium: 135 mmol/L (ref 135–145)
TOTAL PROTEIN: 7.1 g/dL (ref 6.5–8.1)

## 2018-01-22 MED ORDER — SODIUM CHLORIDE 0.9 % IV SOLN: 8.0000 mg | Freq: Once | INTRAVENOUS | Status: DC

## 2018-01-22 MED ORDER — FAMOTIDINE IN NACL 20-0.9 MG/50ML-% IV SOLN
20.0000 mg | Freq: Once | INTRAVENOUS | Status: AC
  Administered 2018-01-22: 20 mg via INTRAVENOUS
  Filled 2018-01-22: qty 50

## 2018-01-22 MED ORDER — SODIUM CHLORIDE 0.9 % IV SOLN
70.0000 mg/m2 | Freq: Once | INTRAVENOUS | Status: AC
  Administered 2018-01-22: 138 mg via INTRAVENOUS
  Filled 2018-01-22: qty 23

## 2018-01-22 MED ORDER — SODIUM CHLORIDE 0.9% FLUSH
10.0000 mL | INTRAVENOUS | Status: DC | PRN
  Administered 2018-01-22: 10 mL via INTRAVENOUS
  Filled 2018-01-22: qty 10

## 2018-01-22 MED ORDER — HEPARIN SOD (PORK) LOCK FLUSH 100 UNIT/ML IV SOLN
500.0000 [IU] | Freq: Once | INTRAVENOUS | Status: AC
  Administered 2018-01-22: 500 [IU] via INTRAVENOUS
  Filled 2018-01-22: qty 5

## 2018-01-22 MED ORDER — DEXAMETHASONE SODIUM PHOSPHATE 10 MG/ML IJ SOLN
8.0000 mg | Freq: Once | INTRAMUSCULAR | Status: AC
  Administered 2018-01-22: 8 mg via INTRAVENOUS
  Filled 2018-01-22: qty 1

## 2018-01-22 MED ORDER — DIPHENHYDRAMINE HCL 50 MG/ML IJ SOLN
50.0000 mg | Freq: Once | INTRAMUSCULAR | Status: AC
  Administered 2018-01-22: 50 mg via INTRAVENOUS
  Filled 2018-01-22: qty 1

## 2018-01-22 MED ORDER — SODIUM CHLORIDE 0.9 % IV SOLN
Freq: Once | INTRAVENOUS | Status: AC
  Administered 2018-01-22: 10:00:00 via INTRAVENOUS
  Filled 2018-01-22: qty 1000

## 2018-01-22 NOTE — Progress Notes (Signed)
[facility] OFFICE PROGRESS NOTE  Patient Care Team: [name], [name], [name]; [name]; [name]; [name]; EBUS- Positive Paratrac/subcarinal LN s/p ? Taxotere [in [place]; [name]; [name];[name]; [name]; suv 3]; [name]; [name]; [name];  [name]; Ant R iliac bone; [name]; [name]; START-Taxol weekly.   # Poorly controlled Blood sugars- improved.   # Pancreatitis Hx/ CKD IV [creat ~ 3-4; [name]; [name]; mild left sided weakness]  DIAGNOSIS: [ 2015] BREAST CA; [name]; [name]    ;[name], estrogen receptor positive ([place])      INTERVAL HISTORY:  [name] 62 y.o.  female pleasant patient above history of metastatic breast cancer presents for assessment prior to chemotherapy treatment with Taxol.  Covering Dr. Rogue Bussing to see this patient today. Chemotherapy:   Patient has been on Taxol day 1, 8, 15 q. 28 days since April 2019.  Reports tolerating chemotherapy with.  Denies any nausea vomiting fever or chills. Neuropathy: Gradual onset, persistent, worse with holding cold drinks.  No  improving factors.  Last week she reports having difficulty buttoning shirts. Paxil does have been dose reduced last week.  This week he feels that neuropathy is stable.  #Fatigue, chronic, stable.   Review of Systems  Constitutional: Positive for malaise/fatigue. Negative for chills, diaphoresis, fever and weight loss.  HENT: Negative for congestion, ear discharge, ear pain, nosebleeds, sinus pain and sore throat.   Eyes: Negative for double vision, photophobia, pain, discharge and redness.  Respiratory: Negative for cough, hemoptysis, sputum production, [name], palpitations, orthopnea, claudication and leg swelling.  Gastrointestinal: Negative for abdominal pain, blood in stool, constipation, diarrhea, heartburn, melena, nausea and vomiting.  Genitourinary: Negative for dysuria, flank pain, frequency, hematuria and urgency.  Musculoskeletal: Negative for back pain, joint pain, myalgias and neck pain.  Skin: Negative for itching and rash.  Neurological: Positive for tingling and sensory change. Negative for dizziness, tremors, focal weakness, weakness and headaches.  Endo/Heme/Allergies: Negative for environmental allergies. Does not bruise/bleed easily.  Psychiatric/Behavioral: Negative for depression and hallucinations. The patient is not nervous/anxious and does not have insomnia.       PAST MEDICAL HISTORY :  Past Medical History:  Diagnosis Date  . Asthma   . CHF (congestive heart failure) ([place]) 1997  . CKD (chronic kidney disease)   . Depression   . Diabetes mellitus, type 2 ([place])   . Hair loss   . History of left breast cancer 05/29/14  . History of partial hysterectomy 12/31/2016   Per patient.  Has not had a period in years.  Had a partial hysterectomy years ago.  Marland Kitchen  Obesity   . Pancreatitis 1997  . Stroke [facility]) 2010   with mild left arm weakness    PAST SURGICAL HISTORY :   Past Surgical History:   Procedure Laterality Date  . CESAREAN SECTION    . CHOLECYSTECTOMY    . PARTIAL HYSTERECTOMY  12/31/2016   Per patient, she has not had a period in years since she had a partial hysterectomy.    FAMILY HISTORY :   Family History  Problem Relation Age of Onset  . Ovarian cancer Mother   . Diabetes Mother   . Hypertension Mother   . COPD Father   . Hypertension Father   . Diabetes Sister   . Diabetes Brother     SOCIAL HISTORY:   Social History   Tobacco Use  . Smoking status: Former Smoker    Packs/day: 0.50    Years: 1.00    Pack years: 0.50    Types: Cigarettes  . Smokeless tobacco: Never Used  Substance Use Topics  . Alcohol use: No    Alcohol/week: 0.0 oz  . Drug use: No    ALLERGIES:  has No Known Allergies.  MEDICATIONS:  Current Outpatient Medications  Medication Sig Dispense Refill  . acetaminophen-codeine (TYLENOL #4) 300-60 MG tablet Take 2 tablets by mouth every 6 (six) hours as needed for moderate pain.     Marland Kitchen albuterol (PROAIR HFA) 108 (90 BASE) MCG/ACT inhaler Inhale 1 puff into the lungs every 6 (six) hours as needed for shortness of breath.     Marland Kitchen albuterol (PROVENTIL) (2.5 MG/3ML) 0.083% nebulizer solution Inhale 2.5 mg into the lungs every 6 (six) hours as needed for shortness of breath.     . ALPRAZolam (XANAX) 0.5 MG tablet Take 0.5 mg by mouth at bedtime as needed for anxiety or sleep.     Marland Kitchen amLODipine (NORVASC) 10 MG tablet Take 10 mg by mouth daily.     Marland Kitchen aspirin EC 81 MG tablet Take 81 mg by mouth once.     Marland Kitchen atenolol (TENORMIN) 100 MG tablet TAKE 1 TABLETS BY MOUTH TWICE DAILY    . B-D ULTRA-FINE 33 LANCETS MISC Use 1 each 2 (two) times daily.    . B-D ULTRAFINE III SHORT PEN 31G X 8 MM MISC     . bumetanide (BUMEX) 0.5 MG tablet TAKE 1 TABLET BY MOUTH TWICE DAILY    . calcitRIOL (ROCALTROL) 0.25 MCG capsule Take 0.25 mcg by mouth 3 (three) times a week.    . Cinnamon 500 MG capsule Take 500 mg by mouth daily.     . cloNIDine (CATAPRES) 0.2  MG tablet Take 0.2 mg by mouth 2 (two) times daily.     . enalapril (VASOTEC) 20 MG tablet Take 20 mg by mouth 2 (two) times daily.     . ferrous sulfate 325 (65 FE) MG tablet Take 325 mg by mouth 2 (two) times daily with a meal.    . FLUoxetine (PROZAC) 20 MG capsule Take 20 mg by mouth 2 (two) times daily.     Marland Kitchen glucose blood (ONE TOUCH ULTRA TEST) test strip Use 1 each 2 (two) times daily. Use as instructed.    . glyBURIDE (DIABETA) 5 MG tablet Take 5 mg by mouth daily with breakfast.     . LEVEMIR FLEXTOUCH 100 UNIT/ML Pen Inject 55 Units into the skin daily.     . metoprolol tartrate (LOPRESSOR) 25 MG tablet TAKE 1 TABLET BY MOUTH TWICE DAILY 60 tablet 0  .  mometasone (NASONEX) 50 MCG/ACT nasal spray Place 2 sprays into the nose daily as needed.     Marland Kitchen NOVOLOG FLEXPEN 100 UNIT/ML FlexPen Inject 7 Units into the skin 2 (two) times daily at 8 am and 10 pm.     . ondansetron (ZOFRAN) 8 MG tablet One pill every 8 hours as needed for nausea/vomitting. 40 tablet 1  . prochlorperazine (COMPAZINE) 10 MG tablet Take 1 tablet (10 mg total) by mouth every 6 (six) hours as needed for nausea or vomiting. Please note change in strength 60 tablet 4  . salmeterol (SEREVENT) 50 MCG/DOSE diskus inhaler Inhale 1 puff into the lungs 2 (two) times daily.    . simvastatin (ZOCOR) 20 MG tablet Take 20 mg by mouth daily at 6 PM.     . vitamin B-12 (CYANOCOBALAMIN) 1000 MCG tablet Take 1,000 mcg by mouth daily.     No current facility-administered medications for this visit.    Facility-Administered Medications Ordered in Other Visits  Medication Dose Route Frequency Provider Last Rate Last Dose  . sodium chloride flush (NS) 0.9 % injection 10 mL  10 mL Intravenous PRN Cammie Sickle, MD   10 mL at 01/30/16 1054    PHYSICAL EXAMINATION: ECOG PERFORMANCE STATUS: 1 - Symptomatic but completely ambulatory  BP 136/82 (BP Location: Right Arm, Patient Position: Sitting)   Pulse 75   Temp (!) 97.2 F (36.2  C) (Tympanic)   Resp 20   Ht 5\' 2"  (1.575 m)   Wt 192 lb 8 oz (87.3 kg)   BMI 35.21 kg/m   Filed Weights   01/22/18 0852  Weight: 192 lb 8 oz (87.3 kg)   Physical Exam  Constitutional: She is oriented to person, place, and time and well-developed, well-nourished, and in no distress. No distress.  HENT:  Head: Normocephalic and atraumatic.  Nose: Nose normal.  Mouth/Throat: Oropharynx is clear and moist. No oropharyngeal exudate.  Eyes: Pupils are equal, round, and reactive to light. EOM are normal. Left eye exhibits no discharge. No scleral icterus.  Neck: Normal range of motion. Neck supple. No JVD present.  Cardiovascular: Normal rate, regular rhythm and normal heart sounds.  No murmur heard. Pulmonary/Chest: Effort normal and breath sounds normal. No respiratory distress. She has no wheezes. She has no rales. She exhibits no tenderness.    Decreased breath on bilaterally  Abdominal: Soft. She exhibits no distension and no mass. There is no tenderness. There is no rebound.  Musculoskeletal: Normal range of motion. She exhibits no edema or tenderness.  Lymphadenopathy:    She has no cervical adenopathy.  Neurological: She is alert and oriented to person, place, and time. No cranial nerve deficit. She exhibits normal muscle tone. Coordination normal.  Skin: Skin is warm and dry. No rash noted. She is not diaphoretic. No erythema.  Psychiatric: Affect and judgment normal.     LABORATORY DATA:  I have reviewed the data as listed    Component Value Date/Time   NA 136 01/15/2018 0930   NA 130 (L) 06/06/2014 1102   K 4.5 01/15/2018 0930   K 3.9 06/06/2014 1102   CL 107 01/15/2018 0930   CL 95 (L) 06/06/2014 1102   CO2 21 (L) 01/15/2018 0930   CO2 28 06/06/2014 1102   GLUCOSE 91 01/15/2018 0930   GLUCOSE 349 (H) 06/06/2014 1102   BUN 50 (H) 01/15/2018 0930   BUN 17 06/06/2014 1102   CREATININE 2.85 (H) 01/15/2018 0930   CREATININE 1.63 (H) 06/06/2014 1102  CALCIUM 8.9  01/15/2018 0930   CALCIUM 9.2 06/06/2014 1102   PROT 7.1 01/15/2018 0930   PROT 8.2 06/06/2014 1102   ALBUMIN 3.5 01/15/2018 0930   ALBUMIN 3.3 (L) 06/06/2014 1102   AST 21 01/15/2018 0930   AST 7 (L) 06/06/2014 1102   ALT 33 01/15/2018 0930   ALT 12 (L) 06/06/2014 1102   ALKPHOS 98 01/15/2018 0930   ALKPHOS 74 06/06/2014 1102   BILITOT 0.7 01/15/2018 0930   BILITOT 0.4 06/06/2014 1102   GFRNONAA 17 (L) 01/15/2018 0930   GFRNONAA 35 (L) 06/06/2014 1102   GFRAA 20 (L) 01/15/2018 0930   GFRAA 42 (L) 06/06/2014 1102    No results found for: SPEP, UPEP  Lab Results  Component Value Date   WBC 7.0 01/15/2018   NEUTROABS 5.4 01/15/2018   HGB 8.6 (L) 01/15/2018   HCT 24.8 (L) 01/15/2018   MCV 95.2 01/15/2018   PLT 335 01/15/2018      Chemistry      Component Value Date/Time   NA 136 01/15/2018 0930   NA 130 (L) 06/06/2014 1102   K 4.5 01/15/2018 0930   K 3.9 06/06/2014 1102   CL 107 01/15/2018 0930   CL 95 (L) 06/06/2014 1102   CO2 21 (L) 01/15/2018 0930   CO2 28 06/06/2014 1102   BUN 50 (H) 01/15/2018 0930   BUN 17 06/06/2014 1102   CREATININE 2.85 (H) 01/15/2018 0930   CREATININE 1.63 (H) 06/06/2014 1102      Component Value Date/Time   CALCIUM 8.9 01/15/2018 0930   CALCIUM 9.2 06/06/2014 1102   ALKPHOS 98 01/15/2018 0930   ALKPHOS 74 06/06/2014 1102   AST 21 01/15/2018 0930   AST 7 (L) 06/06/2014 1102   ALT 33 01/15/2018 0930   ALT 12 (L) 06/06/2014 1102   BILITOT 0.7 01/15/2018 0930   BILITOT 0.4 06/06/2014 1102       ASSESSMENT & PLAN:  1. Encounter for antineoplastic chemotherapy   2. Metastatic breast cancer ([place])   3. Neuropathy due to chemotherapeutic drug ([place])   4. Anemia due to antineoplastic chemotherapy   5. Anemia in stage 4 chronic kidney disease ([facility])    #Breast cancer: Proceed with today's Taxol with dose reduced to 70 mg/m.  See below.  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  Taxol dose has been dose reduced.  Given her CKD with a  GFR less than 30.  Gabapentin and Cymbalta are not good options for her. Close monitor.  She may need further dose reducing or dose interruption if neuropathy get worse.  #Anemia  multifactorial combination of CKD/antineoplastic treatment.  Stable at baseline.  Continue to monitor.  No need for transfusion today.  #Chronic kidney disease, creatinine stable.  Patient can follow-up in 2 week for labs and assessment prior to next treatment of Taxol. All questions were answered. The patient knows to call the clinic with any problems, questions or concerns.  Total face to face encounter time for this patient visit was 25 min. >50% of the time was  spent in counseling and coordination of care.     Earlie Server, MD 01/22/2018 8:27 AM

## 2018-01-22 NOTE — Progress Notes (Signed)
Worsening neuropathy in fingertips. Unable to button her shirt and has trouble picking up objects.

## 2018-02-05 ENCOUNTER — Inpatient Hospital Stay (HOSPITAL_BASED_OUTPATIENT_CLINIC_OR_DEPARTMENT_OTHER): Payer: Medicare Other | Admitting: Internal Medicine

## 2018-02-05 ENCOUNTER — Inpatient Hospital Stay: Payer: Medicare Other

## 2018-02-05 ENCOUNTER — Encounter: Payer: Self-pay | Admitting: Internal Medicine

## 2018-02-05 ENCOUNTER — Inpatient Hospital Stay: Payer: Medicare Other | Attending: Internal Medicine

## 2018-02-05 VITALS — BP 160/90 | HR 73 | Temp 97.3°F | Resp 16 | Wt 197.6 lb

## 2018-02-05 DIAGNOSIS — R5381 Other malaise: Secondary | ICD-10-CM | POA: Insufficient documentation

## 2018-02-05 DIAGNOSIS — C50212 Malignant neoplasm of upper-inner quadrant of left female breast: Secondary | ICD-10-CM

## 2018-02-05 DIAGNOSIS — I69354 Hemiplegia and hemiparesis following cerebral infarction affecting left non-dominant side: Secondary | ICD-10-CM | POA: Insufficient documentation

## 2018-02-05 DIAGNOSIS — Z7982 Long term (current) use of aspirin: Secondary | ICD-10-CM | POA: Diagnosis not present

## 2018-02-05 DIAGNOSIS — C782 Secondary malignant neoplasm of pleura: Secondary | ICD-10-CM | POA: Insufficient documentation

## 2018-02-05 DIAGNOSIS — N184 Chronic kidney disease, stage 4 (severe): Secondary | ICD-10-CM | POA: Diagnosis not present

## 2018-02-05 DIAGNOSIS — R5383 Other fatigue: Secondary | ICD-10-CM | POA: Diagnosis not present

## 2018-02-05 DIAGNOSIS — C7951 Secondary malignant neoplasm of bone: Secondary | ICD-10-CM | POA: Diagnosis not present

## 2018-02-05 DIAGNOSIS — E1122 Type 2 diabetes mellitus with diabetic chronic kidney disease: Secondary | ICD-10-CM | POA: Insufficient documentation

## 2018-02-05 DIAGNOSIS — D631 Anemia in chronic kidney disease: Secondary | ICD-10-CM | POA: Insufficient documentation

## 2018-02-05 DIAGNOSIS — Z87891 Personal history of nicotine dependence: Secondary | ICD-10-CM | POA: Insufficient documentation

## 2018-02-05 DIAGNOSIS — Z170 Estrogen receptor positive status [ER+]: Secondary | ICD-10-CM | POA: Diagnosis not present

## 2018-02-05 DIAGNOSIS — Z800 Family history of malignant neoplasm of digestive organs: Secondary | ICD-10-CM | POA: Insufficient documentation

## 2018-02-05 DIAGNOSIS — Z803 Family history of malignant neoplasm of breast: Secondary | ICD-10-CM | POA: Insufficient documentation

## 2018-02-05 DIAGNOSIS — Z8041 Family history of malignant neoplasm of ovary: Secondary | ICD-10-CM | POA: Diagnosis not present

## 2018-02-05 DIAGNOSIS — I509 Heart failure, unspecified: Secondary | ICD-10-CM | POA: Insufficient documentation

## 2018-02-05 DIAGNOSIS — C771 Secondary and unspecified malignant neoplasm of intrathoracic lymph nodes: Secondary | ICD-10-CM | POA: Insufficient documentation

## 2018-02-05 DIAGNOSIS — Z79899 Other long term (current) drug therapy: Secondary | ICD-10-CM | POA: Insufficient documentation

## 2018-02-05 DIAGNOSIS — Z5111 Encounter for antineoplastic chemotherapy: Secondary | ICD-10-CM | POA: Diagnosis not present

## 2018-02-05 DIAGNOSIS — G620 Drug-induced polyneuropathy: Secondary | ICD-10-CM | POA: Insufficient documentation

## 2018-02-05 DIAGNOSIS — T451X5S Adverse effect of antineoplastic and immunosuppressive drugs, sequela: Secondary | ICD-10-CM | POA: Diagnosis not present

## 2018-02-05 DIAGNOSIS — Z794 Long term (current) use of insulin: Secondary | ICD-10-CM | POA: Insufficient documentation

## 2018-02-05 LAB — CBC WITH DIFFERENTIAL/PLATELET
BASOS PCT: 1 %
Basophils Absolute: 0.1 10*3/uL (ref 0–0.1)
EOS ABS: 0.1 10*3/uL (ref 0–0.7)
EOS PCT: 1 %
HCT: 28.2 % — ABNORMAL LOW (ref 35.0–47.0)
HEMOGLOBIN: 9.6 g/dL — AB (ref 12.0–16.0)
LYMPHS ABS: 1.3 10*3/uL (ref 1.0–3.6)
Lymphocytes Relative: 13 %
MCH: 32 pg (ref 26.0–34.0)
MCHC: 34 g/dL (ref 32.0–36.0)
MCV: 94.3 fL (ref 80.0–100.0)
MONOS PCT: 11 %
Monocytes Absolute: 1.2 10*3/uL — ABNORMAL HIGH (ref 0.2–0.9)
NEUTROS PCT: 74 %
Neutro Abs: 8 10*3/uL — ABNORMAL HIGH (ref 1.4–6.5)
PLATELETS: 446 10*3/uL — AB (ref 150–440)
RBC: 2.99 MIL/uL — AB (ref 3.80–5.20)
RDW: 16.2 % — ABNORMAL HIGH (ref 11.5–14.5)
WBC: 10.7 10*3/uL (ref 3.6–11.0)

## 2018-02-05 LAB — COMPREHENSIVE METABOLIC PANEL
ALK PHOS: 83 U/L (ref 38–126)
ALT: 15 U/L (ref 0–44)
AST: 18 U/L (ref 15–41)
Albumin: 3.6 g/dL (ref 3.5–5.0)
Anion gap: 10 (ref 5–15)
BUN: 31 mg/dL — AB (ref 6–20)
CALCIUM: 8.8 mg/dL — AB (ref 8.9–10.3)
CHLORIDE: 105 mmol/L (ref 98–111)
CO2: 21 mmol/L — ABNORMAL LOW (ref 22–32)
CREATININE: 3.18 mg/dL — AB (ref 0.44–1.00)
GFR calc Af Amer: 17 mL/min — ABNORMAL LOW (ref >60)
GFR calc non Af Amer: 15 mL/min — ABNORMAL LOW (ref >60)
Glucose, Bld: 85 mg/dL (ref 70–99)
Potassium: 4.4 mmol/L (ref 3.5–5.1)
SODIUM: 136 mmol/L (ref 135–145)
Total Bilirubin: 0.7 mg/dL (ref 0.3–1.2)
Total Protein: 7.2 g/dL (ref 6.5–8.1)

## 2018-02-05 MED ORDER — HEPARIN SOD (PORK) LOCK FLUSH 100 UNIT/ML IV SOLN
500.0000 [IU] | Freq: Once | INTRAVENOUS | Status: AC
  Administered 2018-02-05: 500 [IU] via INTRAVENOUS
  Filled 2018-02-05: qty 5

## 2018-02-05 MED ORDER — PACLITAXEL CHEMO INJECTION 300 MG/50ML
60.0000 mg/m2 | Freq: Once | INTRAVENOUS | Status: AC
  Administered 2018-02-05: 114 mg via INTRAVENOUS
  Filled 2018-02-05: qty 19

## 2018-02-05 MED ORDER — FAMOTIDINE IN NACL 20-0.9 MG/50ML-% IV SOLN
20.0000 mg | Freq: Once | INTRAVENOUS | Status: AC
  Administered 2018-02-05: 20 mg via INTRAVENOUS
  Filled 2018-02-05: qty 50

## 2018-02-05 MED ORDER — DEXAMETHASONE SODIUM PHOSPHATE 10 MG/ML IJ SOLN
8.0000 mg | Freq: Once | INTRAMUSCULAR | Status: AC
  Administered 2018-02-05: 8 mg via INTRAVENOUS
  Filled 2018-02-05: qty 1

## 2018-02-05 MED ORDER — GABAPENTIN 100 MG PO CAPS: 100.0000 mg | ORAL_CAPSULE | Freq: Every day | ORAL | 2 refills | Status: DC

## 2018-02-05 MED ORDER — SODIUM CHLORIDE 0.9 % IV SOLN
Freq: Once | INTRAVENOUS | Status: AC
  Administered 2018-02-05: 10:00:00 via INTRAVENOUS
  Filled 2018-02-05: qty 1000

## 2018-02-05 MED ORDER — HEPARIN SOD (PORK) LOCK FLUSH 100 UNIT/ML IV SOLN: 500.0000 [IU] | Freq: Once | INTRAVENOUS | Status: DC | PRN

## 2018-02-05 MED ORDER — DIPHENHYDRAMINE HCL 50 MG/ML IJ SOLN
50.0000 mg | Freq: Once | INTRAMUSCULAR | Status: AC
  Administered 2018-02-05: 50 mg via INTRAVENOUS
  Filled 2018-02-05: qty 1

## 2018-02-05 MED ORDER — SODIUM CHLORIDE 0.9% FLUSH
10.0000 mL | Freq: Once | INTRAVENOUS | Status: AC
  Administered 2018-02-05: 10 mL via INTRAVENOUS
  Filled 2018-02-05: qty 10

## 2018-02-05 NOTE — Assessment & Plan Note (Addendum)
Left breast cancer- stage IV- ER/PR +, HER-2/neu -; PET scan on 11/2017 showed progressive pleural mets and rising tumor marker at that time.  # on Taxol chemotherapy; clinical response noted improvement of tumor marker noted.  Labs adequate.  We will plan to get a CT scan after the cycle.  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 Start neurontin 100 mg qhs [renal insuff]  Chronic kidney disease - stage IV- creatinine 2.94. SATBLE.  Type 2 DM- insulin dependent- stable.  Anemia- Hmg today 9.3. Stable. .   # follow up in 2 weeks/labs/MD; CT scan prior- C/A/P; in 1 week-cbc/bmp- 1 week.

## 2018-02-05 NOTE — Progress Notes (Signed)
[place] OFFICE PROGRESS NOTE  Patient Care Team: [name], [name], [name]; [name]; [name]; [name]; EBUS- Positive Paratrac/subcarinal LN s/p ? Taxotere [in [place]; [name]; [name];[name]; [name]; suv 3]; [name]; [name]; [name];  [name]; Ant R iliac bone; [name]; [name]; START-Taxol weekly.   # Poorly controlled Blood sugars- improved.   # Pancreatitis Hx/ CKD IV [creat ~ 3-4; [name]; [name]; mild left sided weakness]  DIAGNOSIS: [ 2015] BREAST CA; [name]; [name]    ;[name], estrogen receptor positive ([place])      INTERVAL HISTORY:  [name] 62 y.o.  female pleasant patient above history of metastatic breast cancer currently on Taxol is here for follow-up  Patient complains of mild tingling in the feet.  States that he has been burning at nighttime.  Complains of mild to moderate fatigue.  No new shortness of breath or cough.  Review of Systems  Constitutional: Positive for malaise/fatigue. Negative for chills, diaphoresis, fever and weight loss.  HENT: Negative for  nosebleeds and sore throat.   Eyes: Negative for double vision.  Respiratory: Negative for cough, hemoptysis, sputum production, [name], palpitations, orthopnea and leg swelling.  Gastrointestinal: Negative for abdominal pain, blood in stool, constipation, diarrhea, heartburn, melena, nausea and vomiting.  Genitourinary: Negative for dysuria, frequency and urgency.  Musculoskeletal: Negative for back pain and joint pain.  Skin: Negative.  Negative for itching and rash.  Neurological: Positive for tingling. Negative for dizziness, focal weakness, weakness and headaches.  Endo/Heme/Allergies: Does not bruise/bleed easily.  Psychiatric/Behavioral: Negative for depression. The patient is not nervous/anxious and does not have insomnia.       PAST MEDICAL HISTORY :  Past Medical History:  Diagnosis Date  . Asthma   . CHF (congestive heart failure) ([place]) 1997  . CKD (chronic kidney disease)   . Depression   . Diabetes mellitus, type 2 ([place])   . Hair loss   . History of left breast cancer 05/29/14  . History of partial hysterectomy 12/31/2016   Per patient.  Has not had a period in years.  Had a partial hysterectomy years ago.  . Obesity   . Pancreatitis 1997  . Stroke [facility]) 2010   with mild left arm weakness    PAST SURGICAL HISTORY :   Past Surgical History:  Procedure Laterality Date  . CESAREAN SECTION    . CHOLECYSTECTOMY    . PARTIAL HYSTERECTOMY  12/31/2016   Per patient, she has not had a period in years since she had a partial hysterectomy.    FAMILY HISTORY :   Family  History  Problem Relation Age of Onset  . Ovarian cancer Mother   . Diabetes Mother   . Hypertension Mother   . COPD Father   . Hypertension Father   . Diabetes Sister   . Diabetes Brother     SOCIAL HISTORY:   Social History   Tobacco Use  . Smoking status: Former Smoker    Packs/day: 0.50    Years: 1.00    Pack years:  0.50    Types: Cigarettes  . Smokeless tobacco: Never Used  Substance Use Topics  . Alcohol use: No    Alcohol/week: 0.0 oz  . Drug use: No    ALLERGIES:  has No Known Allergies.  MEDICATIONS:  Current Outpatient Medications  Medication Sig Dispense Refill  . acetaminophen-codeine (TYLENOL #4) 300-60 MG tablet Take 2 tablets by mouth every 6 (six) hours as needed for moderate pain.     Marland Kitchen albuterol (PROAIR HFA) 108 (90 BASE) MCG/ACT inhaler Inhale 1 puff into the lungs every 6 (six) hours as needed for shortness of breath.     Marland Kitchen albuterol (PROVENTIL) (2.5 MG/3ML) 0.083% nebulizer solution Inhale 2.5 mg into the lungs every 6 (six) hours as needed for shortness of breath.     . ALPRAZolam (XANAX) 0.5 MG tablet Take 0.5 mg by mouth at bedtime as needed for anxiety or sleep.     Marland Kitchen amLODipine (NORVASC) 10 MG tablet Take 10 mg by mouth daily.     Marland Kitchen aspirin EC 81 MG tablet Take 81 mg by mouth once.     Marland Kitchen atenolol (TENORMIN) 100 MG tablet TAKE 1 TABLETS BY MOUTH TWICE DAILY    . B-D ULTRA-FINE 33 LANCETS MISC Use 1 each 2 (two) times daily.    . B-D ULTRAFINE III SHORT PEN 31G X 8 MM MISC     . bumetanide (BUMEX) 0.5 MG tablet TAKE 1 TABLET BY MOUTH TWICE DAILY    . calcitRIOL (ROCALTROL) 0.25 MCG capsule Take 0.25 mcg by mouth 3 (three) times a week.    . Cinnamon 500 MG capsule Take 500 mg by mouth daily.     . cloNIDine (CATAPRES) 0.2 MG tablet Take 0.2 mg by mouth 2 (two) times daily.     . enalapril (VASOTEC) 20 MG tablet Take 20 mg by mouth 2 (two) times daily.     . ferrous sulfate 325 (65 FE) MG tablet Take 325 mg by mouth 2 (two) times daily with a meal.    . FLUoxetine (PROZAC) 20 MG capsule Take 20 mg by mouth 2 (two) times daily.     Marland Kitchen glucose blood (ONE TOUCH ULTRA TEST) test strip Use 1 each 2 (two) times daily. Use as instructed.    . glyBURIDE (DIABETA) 5 MG tablet Take 5 mg by mouth daily with breakfast.     . LEVEMIR FLEXTOUCH 100 UNIT/ML Pen Inject 55 Units into the skin  daily.     . metoprolol tartrate (LOPRESSOR) 25 MG tablet TAKE 1 TABLET BY MOUTH TWICE DAILY 60 tablet 0  . mometasone (NASONEX) 50 MCG/ACT nasal spray Place 2 sprays into the nose daily as needed.     Marland Kitchen NOVOLOG FLEXPEN 100 UNIT/ML FlexPen Inject 7 Units into the skin 2 (two) times daily at 8 am and 10 pm.     . ondansetron (ZOFRAN) 8 MG tablet One pill every 8 hours as needed for nausea/vomitting. 40 tablet 1  . prochlorperazine (COMPAZINE) 10 MG tablet Take 1 tablet (10 mg total) by mouth  every 6 (six) hours as needed for nausea or vomiting. Please note change in strength 60 tablet 4  . salmeterol (SEREVENT) 50 MCG/DOSE diskus inhaler Inhale 1 puff into the lungs 2 (two) times daily.    . simvastatin (ZOCOR) 20 MG tablet Take 20 mg by mouth daily at 6 PM.     . vitamin B-12 (CYANOCOBALAMIN) 1000 MCG tablet Take 1,000 mcg by mouth daily.    Marland Kitchen gabapentin (NEURONTIN) 100 MG capsule Take 1 capsule (100 mg total) by mouth at bedtime. 30 capsule 2   No current facility-administered medications for this visit.    Facility-Administered Medications Ordered in Other Visits  Medication Dose Route Frequency Provider Last Rate Last Dose  . sodium chloride flush (NS) 0.9 % injection 10 mL  10 mL Intravenous PRN Cammie Sickle, MD   10 mL at 01/30/16 1054    PHYSICAL EXAMINATION: ECOG PERFORMANCE STATUS: 1 - Symptomatic but completely ambulatory  BP (!) 160/90 (BP Location: Left Arm, Patient Position: Sitting)   Pulse 73   Temp (!) 97.3 F (36.3 C) (Tympanic)   Resp 16   Wt 197 lb 9.6 oz (89.6 kg)   BMI 36.14 kg/m   Filed Weights   02/05/18 0910  Weight: 197 lb 9.6 oz (89.6 kg)    GENERAL: Well-nourished well-developed; Alert, no distress and comfortable.  She is alone. EYES: no pallor or icterus OROPHARYNX: no thrush or ulceration; NECK: supple; no lymph nodes felt. LYMPH:  no palpable lymphadenopathy in the axillary or inguinal regions LUNGS: Decreased breath sounds auscultation  bilaterally. No wheeze or crackles HEART/CVS: regular rate & rhythm and no murmurs; No lower extremity edema ABDOMEN:abdomen soft, non-tender and normal bowel sounds. No hepatomegaly or splenomegaly.  Musculoskeletal:no cyanosis of digits and no clubbing  PSYCH: alert & oriented x 3 with fluent speech NEURO: no focal motor/sensory deficits SKIN:  no rashes or significant lesions    LABORATORY DATA:  I have reviewed the data as listed    Component Value Date/Time   NA 136 02/05/2018 0848   NA 130 (L) 06/06/2014 1102   K 4.4 02/05/2018 0848   K 3.9 06/06/2014 1102   CL 105 02/05/2018 0848   CL 95 (L) 06/06/2014 1102   CO2 21 (L) 02/05/2018 0848   CO2 28 06/06/2014 1102   GLUCOSE 85 02/05/2018 0848   GLUCOSE 349 (H) 06/06/2014 1102   BUN 31 (H) 02/05/2018 0848   BUN 17 06/06/2014 1102   CREATININE 3.18 (H) 02/05/2018 0848   CREATININE 1.63 (H) 06/06/2014 1102   CALCIUM 8.8 (L) 02/05/2018 0848   CALCIUM 9.2 06/06/2014 1102   PROT 7.2 02/05/2018 0848   PROT 8.2 06/06/2014 1102   ALBUMIN 3.6 02/05/2018 0848   ALBUMIN 3.3 (L) 06/06/2014 1102   AST 18 02/05/2018 0848   AST 7 (L) 06/06/2014 1102   ALT 15 02/05/2018 0848   ALT 12 (L) 06/06/2014 1102   ALKPHOS 83 02/05/2018 0848   ALKPHOS 74 06/06/2014 1102   BILITOT 0.7 02/05/2018 0848   BILITOT 0.4 06/06/2014 1102   GFRNONAA 15 (L) 02/05/2018 0848   GFRNONAA 35 (L) 06/06/2014 1102   GFRAA 17 (L) 02/05/2018 0848   GFRAA 42 (L) 06/06/2014 1102    No results found for: SPEP, UPEP  Lab Results  Component Value Date   WBC 10.7 02/05/2018   NEUTROABS 8.0 (H) 02/05/2018   HGB 9.6 (L) 02/05/2018   HCT 28.2 (L) 02/05/2018   MCV 94.3 02/05/2018   PLT 446 (  H) 02/05/2018      Chemistry      Component Value Date/Time   NA 136 02/05/2018 0848   NA 130 (L) 06/06/2014 1102   K 4.4 02/05/2018 0848   K 3.9 06/06/2014 1102   CL 105 02/05/2018 0848   CL 95 (L) 06/06/2014 1102   CO2 21 (L) 02/05/2018 0848   CO2 28 06/06/2014  1102   BUN 31 (H) 02/05/2018 0848   BUN 17 06/06/2014 1102   CREATININE 3.18 (H) 02/05/2018 0848   CREATININE 1.63 (H) 06/06/2014 1102      Component Value Date/Time   CALCIUM 8.8 (L) 02/05/2018 0848   CALCIUM 9.2 06/06/2014 1102   ALKPHOS 83 02/05/2018 0848   ALKPHOS 74 06/06/2014 1102   AST 18 02/05/2018 0848   AST 7 (L) 06/06/2014 1102   ALT 15 02/05/2018 0848   ALT 12 (L) 06/06/2014 1102   BILITOT 0.7 02/05/2018 0848   BILITOT 0.4 06/06/2014 1102       RADIOGRAPHIC STUDIES: I have personally reviewed the radiological images as listed and agreed with the findings in the report. No results found.   ASSESSMENT & PLAN:  Carcinoma of upper-inner quadrant of left breast in female, estrogen receptor positive ([place]) Left breast cancer- stage IV- ER/PR +, HER-2/neu -; PET scan on 11/2017 showed progressive pleural mets and rising tumor marker at that time.  # on Taxol chemotherapy; clinical response noted improvement of tumor marker noted.  Labs adequate.  We will plan to get a CT scan after the cycle.  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 Start neurontin 100 mg qhs [renal insuff]  Chronic kidney disease - stage IV- creatinine 2.94. SATBLE.  Type 2 DM- insulin dependent- stable.  Anemia- Hmg today 9.3. Stable. .   # follow up in 2 weeks/labs/MD; CT scan prior- C/A/P; in 1 week-cbc/bmp- 1 week.   Orders Placed This Encounter  Procedures  . CT CHEST WO CONTRAST    Standing Status:   Future    Standing Expiration Date:   02/06/2019    Order Specific Question:   Preferred imaging location?    Answer:   [facility]    Order Specific Question:   Radiology Contrast Protocol - do NOT remove file path    Answer:   [email or web address]    Order Specific Question:   ** REASON FOR EXAM (FREE TEXT)    Answer:   breast cancer- on chemo    Order Specific Question:   Is patient pregnant?    Answer:   No  . CT Abdomen Pelvis Wo Contrast    Standing Status:    Future    Standing Expiration Date:   02/05/2019    Order Specific Question:   ** REASON FOR EXAM (FREE TEXT)    Answer:   breast cancer on chemo    Order Specific Question:   Is patient pregnant?    Answer:   No    Order Specific Question:   Preferred imaging location?    Answer:   [facility]    Order Specific Question:   Is Oral Contrast requested for this exam?    Answer:   Yes, Per Radiology protocol    Order Specific Question:   Radiology Contrast Protocol - do NOT remove file path    Answer:   \\[email or web address]   All questions were answered. The patient knows to call the clinic with any problems, questions or concerns.      Cammie Sickle,  MD 02/05/2018 10:18 PM

## 2018-02-12 ENCOUNTER — Inpatient Hospital Stay: Payer: Medicare Other

## 2018-02-12 VITALS — BP 116/76 | HR 77 | Temp 97.0°F | Resp 18

## 2018-02-12 DIAGNOSIS — Z170 Estrogen receptor positive status [ER+]: Secondary | ICD-10-CM | POA: Diagnosis not present

## 2018-02-12 DIAGNOSIS — Z5111 Encounter for antineoplastic chemotherapy: Secondary | ICD-10-CM | POA: Diagnosis not present

## 2018-02-12 DIAGNOSIS — C771 Secondary and unspecified malignant neoplasm of intrathoracic lymph nodes: Secondary | ICD-10-CM | POA: Diagnosis not present

## 2018-02-12 DIAGNOSIS — C50212 Malignant neoplasm of upper-inner quadrant of left female breast: Secondary | ICD-10-CM

## 2018-02-12 DIAGNOSIS — C7951 Secondary malignant neoplasm of bone: Secondary | ICD-10-CM | POA: Diagnosis not present

## 2018-02-12 DIAGNOSIS — C782 Secondary malignant neoplasm of pleura: Secondary | ICD-10-CM | POA: Diagnosis not present

## 2018-02-12 LAB — CBC WITH DIFFERENTIAL/PLATELET
BASOS PCT: 1 %
Basophils Absolute: 0.1 10*3/uL (ref 0–0.1)
Eosinophils Absolute: 0.1 10*3/uL (ref 0–0.7)
Eosinophils Relative: 1 %
HCT: 29.8 % — ABNORMAL LOW (ref 35.0–47.0)
HEMOGLOBIN: 10.2 g/dL — AB (ref 12.0–16.0)
Lymphocytes Relative: 26 %
Lymphs Abs: 2.8 10*3/uL (ref 1.0–3.6)
MCH: 31.9 pg (ref 26.0–34.0)
MCHC: 34.1 g/dL (ref 32.0–36.0)
MCV: 93.7 fL (ref 80.0–100.0)
MONO ABS: 1 10*3/uL — AB (ref 0.2–0.9)
MONOS PCT: 9 %
Neutro Abs: 6.8 10*3/uL — ABNORMAL HIGH (ref 1.4–6.5)
Neutrophils Relative %: 63 %
PLATELETS: 414 10*3/uL (ref 150–440)
RBC: 3.18 MIL/uL — ABNORMAL LOW (ref 3.80–5.20)
RDW: 16.5 % — ABNORMAL HIGH (ref 11.5–14.5)
WBC: 10.8 10*3/uL (ref 3.6–11.0)

## 2018-02-12 LAB — COMPREHENSIVE METABOLIC PANEL
ALBUMIN: 4 g/dL (ref 3.5–5.0)
ALK PHOS: 90 U/L (ref 38–126)
ALT: 16 U/L (ref 0–44)
AST: 18 U/L (ref 15–41)
Anion gap: 10 (ref 5–15)
BILIRUBIN TOTAL: 0.4 mg/dL (ref 0.3–1.2)
BUN: 37 mg/dL — ABNORMAL HIGH (ref 6–20)
CALCIUM: 9.3 mg/dL (ref 8.9–10.3)
CO2: 20 mmol/L — AB (ref 22–32)
CREATININE: 2.96 mg/dL — AB (ref 0.44–1.00)
Chloride: 107 mmol/L (ref 98–111)
GFR calc non Af Amer: 16 mL/min — ABNORMAL LOW (ref >60)
GFR, EST AFRICAN AMERICAN: 19 mL/min — AB (ref >60)
GLUCOSE: 67 mg/dL — AB (ref 70–99)
Potassium: 3.7 mmol/L (ref 3.5–5.1)
SODIUM: 137 mmol/L (ref 135–145)
Total Protein: 8.1 g/dL (ref 6.5–8.1)

## 2018-02-12 MED ORDER — DEXAMETHASONE SODIUM PHOSPHATE 10 MG/ML IJ SOLN
8.0000 mg | Freq: Once | INTRAMUSCULAR | Status: AC
  Administered 2018-02-12: 8 mg via INTRAVENOUS
  Filled 2018-02-12: qty 1

## 2018-02-12 MED ORDER — FAMOTIDINE IN NACL 20-0.9 MG/50ML-% IV SOLN
20.0000 mg | Freq: Once | INTRAVENOUS | Status: AC
  Administered 2018-02-12: 20 mg via INTRAVENOUS
  Filled 2018-02-12: qty 50

## 2018-02-12 MED ORDER — SODIUM CHLORIDE 0.9 % IV SOLN
Freq: Once | INTRAVENOUS | Status: AC
  Administered 2018-02-12: 09:00:00 via INTRAVENOUS
  Filled 2018-02-12: qty 1000

## 2018-02-12 MED ORDER — SODIUM CHLORIDE 0.9 % IV SOLN
60.0000 mg/m2 | Freq: Once | INTRAVENOUS | Status: AC
  Administered 2018-02-12: 114 mg via INTRAVENOUS
  Filled 2018-02-12: qty 19

## 2018-02-12 MED ORDER — DIPHENHYDRAMINE HCL 50 MG/ML IJ SOLN
50.0000 mg | Freq: Once | INTRAMUSCULAR | Status: AC
  Administered 2018-02-12: 50 mg via INTRAVENOUS
  Filled 2018-02-12: qty 1

## 2018-02-12 MED ORDER — HEPARIN SOD (PORK) LOCK FLUSH 100 UNIT/ML IV SOLN
500.0000 [IU] | Freq: Once | INTRAVENOUS | Status: AC
  Administered 2018-02-12: 500 [IU] via INTRAVENOUS
  Filled 2018-02-12: qty 5

## 2018-02-12 MED ORDER — SODIUM CHLORIDE 0.9% FLUSH
10.0000 mL | INTRAVENOUS | Status: DC | PRN
  Administered 2018-02-12: 10 mL via INTRAVENOUS
  Filled 2018-02-12: qty 10

## 2018-02-16 ENCOUNTER — Ambulatory Visit
Admission: RE | Admit: 2018-02-16 | Discharge: 2018-02-16 | Disposition: A | Discharge status: Home / Self Care | Payer: Medicare Other | Source: Ambulatory Visit | Attending: Internal Medicine | Admitting: Internal Medicine

## 2018-02-16 DIAGNOSIS — C782 Secondary malignant neoplasm of pleura: Secondary | ICD-10-CM | POA: Insufficient documentation

## 2018-02-16 DIAGNOSIS — C50212 Malignant neoplasm of upper-inner quadrant of left female breast: Secondary | ICD-10-CM | POA: Diagnosis not present

## 2018-02-16 DIAGNOSIS — C7951 Secondary malignant neoplasm of bone: Secondary | ICD-10-CM | POA: Insufficient documentation

## 2018-02-16 DIAGNOSIS — N2889 Other specified disorders of kidney and ureter: Secondary | ICD-10-CM | POA: Diagnosis not present

## 2018-02-16 DIAGNOSIS — J439 Emphysema, unspecified: Secondary | ICD-10-CM | POA: Diagnosis not present

## 2018-02-16 DIAGNOSIS — Z170 Estrogen receptor positive status [ER+]: Secondary | ICD-10-CM | POA: Insufficient documentation

## 2018-02-19 ENCOUNTER — Encounter: Payer: Self-pay | Admitting: Internal Medicine

## 2018-02-19 ENCOUNTER — Inpatient Hospital Stay: Payer: Medicare Other

## 2018-02-19 ENCOUNTER — Inpatient Hospital Stay (HOSPITAL_BASED_OUTPATIENT_CLINIC_OR_DEPARTMENT_OTHER): Payer: Medicare Other | Admitting: Internal Medicine

## 2018-02-19 ENCOUNTER — Other Ambulatory Visit: Payer: Self-pay

## 2018-02-19 VITALS — BP 132/85 | HR 76 | Temp 97.1°F | Resp 20 | Ht 62.0 in | Wt 197.0 lb

## 2018-02-19 DIAGNOSIS — C50212 Malignant neoplasm of upper-inner quadrant of left female breast: Secondary | ICD-10-CM

## 2018-02-19 DIAGNOSIS — R5381 Other malaise: Secondary | ICD-10-CM

## 2018-02-19 DIAGNOSIS — Z170 Estrogen receptor positive status [ER+]: Principal | ICD-10-CM

## 2018-02-19 DIAGNOSIS — Z5111 Encounter for antineoplastic chemotherapy: Secondary | ICD-10-CM | POA: Diagnosis not present

## 2018-02-19 DIAGNOSIS — T451X5S Adverse effect of antineoplastic and immunosuppressive drugs, sequela: Secondary | ICD-10-CM | POA: Diagnosis not present

## 2018-02-19 DIAGNOSIS — C771 Secondary and unspecified malignant neoplasm of intrathoracic lymph nodes: Secondary | ICD-10-CM | POA: Diagnosis not present

## 2018-02-19 DIAGNOSIS — Z8041 Family history of malignant neoplasm of ovary: Secondary | ICD-10-CM

## 2018-02-19 DIAGNOSIS — D631 Anemia in chronic kidney disease: Secondary | ICD-10-CM

## 2018-02-19 DIAGNOSIS — C7951 Secondary malignant neoplasm of bone: Secondary | ICD-10-CM

## 2018-02-19 DIAGNOSIS — C782 Secondary malignant neoplasm of pleura: Secondary | ICD-10-CM

## 2018-02-19 DIAGNOSIS — N184 Chronic kidney disease, stage 4 (severe): Secondary | ICD-10-CM

## 2018-02-19 DIAGNOSIS — R5383 Other fatigue: Secondary | ICD-10-CM

## 2018-02-19 DIAGNOSIS — E1122 Type 2 diabetes mellitus with diabetic chronic kidney disease: Secondary | ICD-10-CM | POA: Diagnosis not present

## 2018-02-19 DIAGNOSIS — I509 Heart failure, unspecified: Secondary | ICD-10-CM

## 2018-02-19 DIAGNOSIS — Z803 Family history of malignant neoplasm of breast: Secondary | ICD-10-CM

## 2018-02-19 DIAGNOSIS — I69354 Hemiplegia and hemiparesis following cerebral infarction affecting left non-dominant side: Secondary | ICD-10-CM

## 2018-02-19 DIAGNOSIS — Z794 Long term (current) use of insulin: Secondary | ICD-10-CM

## 2018-02-19 DIAGNOSIS — Z79899 Other long term (current) drug therapy: Secondary | ICD-10-CM

## 2018-02-19 DIAGNOSIS — Z7982 Long term (current) use of aspirin: Secondary | ICD-10-CM

## 2018-02-19 DIAGNOSIS — Z800 Family history of malignant neoplasm of digestive organs: Secondary | ICD-10-CM

## 2018-02-19 DIAGNOSIS — G620 Drug-induced polyneuropathy: Secondary | ICD-10-CM

## 2018-02-19 DIAGNOSIS — Z87891 Personal history of nicotine dependence: Secondary | ICD-10-CM

## 2018-02-19 LAB — CBC WITH DIFFERENTIAL/PLATELET
Basophils Absolute: 0 10*3/uL (ref 0–0.1)
Basophils Relative: 0 %
EOS PCT: 2 %
Eosinophils Absolute: 0.1 10*3/uL (ref 0–0.7)
HCT: 27.2 % — ABNORMAL LOW (ref 35.0–47.0)
Hemoglobin: 9.2 g/dL — ABNORMAL LOW (ref 12.0–16.0)
LYMPHS ABS: 1 10*3/uL (ref 1.0–3.6)
LYMPHS PCT: 11 %
MCH: 31.8 pg (ref 26.0–34.0)
MCHC: 33.9 g/dL (ref 32.0–36.0)
MCV: 94 fL (ref 80.0–100.0)
MONO ABS: 0.7 10*3/uL (ref 0.2–0.9)
MONOS PCT: 8 %
Neutro Abs: 7.1 10*3/uL — ABNORMAL HIGH (ref 1.4–6.5)
Neutrophils Relative %: 79 %
PLATELETS: 327 10*3/uL (ref 150–440)
RBC: 2.89 MIL/uL — AB (ref 3.80–5.20)
RDW: 16 % — ABNORMAL HIGH (ref 11.5–14.5)
WBC: 8.9 10*3/uL (ref 3.6–11.0)

## 2018-02-19 LAB — COMPREHENSIVE METABOLIC PANEL
ALBUMIN: 3.4 g/dL — AB (ref 3.5–5.0)
ALT: 13 U/L (ref 0–44)
AST: 14 U/L — AB (ref 15–41)
Alkaline Phosphatase: 76 U/L (ref 38–126)
Anion gap: 8 (ref 5–15)
BUN: 30 mg/dL — ABNORMAL HIGH (ref 6–20)
CHLORIDE: 109 mmol/L (ref 98–111)
CO2: 20 mmol/L — AB (ref 22–32)
Calcium: 8.6 mg/dL — ABNORMAL LOW (ref 8.9–10.3)
Creatinine, Ser: 2.83 mg/dL — ABNORMAL HIGH (ref 0.44–1.00)
GFR calc Af Amer: 20 mL/min — ABNORMAL LOW (ref >60)
GFR calc non Af Amer: 17 mL/min — ABNORMAL LOW (ref >60)
GLUCOSE: 130 mg/dL — AB (ref 70–99)
POTASSIUM: 4.3 mmol/L (ref 3.5–5.1)
Sodium: 137 mmol/L (ref 135–145)
Total Bilirubin: 0.4 mg/dL (ref 0.3–1.2)
Total Protein: 6.8 g/dL (ref 6.5–8.1)

## 2018-02-19 MED ORDER — SODIUM CHLORIDE 0.9 % IV SOLN
60.0000 mg/m2 | Freq: Once | INTRAVENOUS | Status: AC
  Administered 2018-02-19: 114 mg via INTRAVENOUS
  Filled 2018-02-19: qty 19

## 2018-02-19 MED ORDER — HEPARIN SOD (PORK) LOCK FLUSH 100 UNIT/ML IV SOLN
500.0000 [IU] | Freq: Once | INTRAVENOUS | Status: AC | PRN
  Administered 2018-02-19: 500 [IU]

## 2018-02-19 MED ORDER — SODIUM CHLORIDE 0.9 % IV SOLN
Freq: Once | INTRAVENOUS | Status: AC
  Administered 2018-02-19: 09:00:00 via INTRAVENOUS
  Filled 2018-02-19: qty 1000

## 2018-02-19 MED ORDER — DEXAMETHASONE SODIUM PHOSPHATE 10 MG/ML IJ SOLN
8.0000 mg | Freq: Once | INTRAMUSCULAR | Status: AC
  Administered 2018-02-19: 8 mg via INTRAVENOUS
  Filled 2018-02-19: qty 1

## 2018-02-19 MED ORDER — DIPHENHYDRAMINE HCL 50 MG/ML IJ SOLN
50.0000 mg | Freq: Once | INTRAMUSCULAR | Status: AC
  Administered 2018-02-19: 50 mg via INTRAVENOUS
  Filled 2018-02-19: qty 1

## 2018-02-19 MED ORDER — HEPARIN SOD (PORK) LOCK FLUSH 100 UNIT/ML IV SOLN
500.0000 [IU] | Freq: Once | INTRAVENOUS | Status: AC
  Administered 2018-02-19: 500 [IU] via INTRAVENOUS
  Filled 2018-02-19: qty 5

## 2018-02-19 MED ORDER — SODIUM CHLORIDE 0.9% FLUSH
10.0000 mL | Freq: Once | INTRAVENOUS | Status: AC
  Administered 2018-02-19: 10 mL via INTRAVENOUS
  Filled 2018-02-19: qty 10

## 2018-02-19 MED ORDER — FAMOTIDINE IN NACL 20-0.9 MG/50ML-% IV SOLN
20.0000 mg | Freq: Once | INTRAVENOUS | Status: AC
  Administered 2018-02-19: 20 mg via INTRAVENOUS
  Filled 2018-02-19: qty 50

## 2018-02-19 NOTE — Assessment & Plan Note (Addendum)
Left breast cancer- stage IV- ER/PR +, HER-2/neu - on Taxol chemotherapy; clinical response noted improvement of tumor marker noted. July 2019- CT scan- stable bone; pleural based mets; except NEW Right LL nodular mass [ c below].  Stable  # Continue Taxol 48m/m2; Labs today reviewed;  acceptable for treatment today.   #Right lower lobe nodular mass pleural-based-likely atelectasis less likely progressive malignancy; get a PET scan for further evaluation.  # PN-2-worsening.  Cut dose of taxol 60 mg/m2. 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   # Chronic kidney disease - stage IV- creatinine 2.6; stable  # ? Genetic predisposition- sister- 618breast cancer- Bil; youngest sister- breast cyst; mom-ovarian cancer; dad's sister [80s]- pancreatic ca- refer to Genetic Counselling  # Type 2 DM- insulin dependent-stable  # Anemia- hemoglobin today 9.3. Stable.; on PO iron.   # follow up in 2 weeks/labs/MD; PET in 2 weeks; GC  # I reviewed the blood work- with the patient in detail; also reviewed the imaging independently [as summarized above]; and with the patient in detail.

## 2018-02-19 NOTE — Progress Notes (Signed)
[place] OFFICE PROGRESS NOTE  Patient Care Team: [name], [name], [name]; [name]; [name]; [name]; EBUS- Positive Paratrac/subcarinal LN s/p ? Taxotere [in [place]; [name]; [name];[name]; [name]; suv 3]; [name]; [name]; [name];  [name]; Ant R iliac bone; [name]; [name]; START-Taxol weekly.   # Poorly controlled Blood sugars- improved.   # Pancreatitis Hx/ CKD IV [creat ~ 3-4; [name]; [name]; mild left sided weakness]  DIAGNOSIS: [ 2015] BREAST CA; [name]; [name]    ;[name], estrogen receptor positive ([place])      INTERVAL HISTORY:  [name] 62 y.o.  female pleasant patient above history of ER PR positive metastatic breast cancer currently on Taxol is here for follow-up/review the results of the CAT scan.  Patient complains of mild fatigue.  Otherwise denies any fever chills worsening cough or shortness of breath.  Complains of mild tingling and numbness in the feet.  Not worse.  Review of Systems  Constitutional: Positive for malaise/fatigue. Negative for chills,  diaphoresis, [name], hemoptysis, sputum production, [name], palpitations, orthopnea and leg swelling.  Gastrointestinal: Negative for abdominal pain, blood in stool, constipation, diarrhea, heartburn, melena, nausea and vomiting.  Genitourinary: Negative for dysuria, frequency and urgency.  Musculoskeletal: Negative for back pain and joint pain.  Skin: Negative.  Negative for itching and rash.  Neurological: Positive for tingling. Negative for dizziness, focal weakness, weakness and headaches.  Endo/Heme/Allergies: Does not bruise/bleed easily.  Psychiatric/Behavioral: Negative for depression. The patient is not nervous/anxious and does not have insomnia.       PAST MEDICAL HISTORY :  Past Medical History:  Diagnosis Date  . Asthma   . CHF (congestive heart failure) ([place]) 1997  . CKD (chronic kidney disease)   . Depression   . Diabetes mellitus, type 2 ([place])   . Hair loss   . History of left breast cancer 05/29/14  . History of partial hysterectomy 12/31/2016   Per patient.  Has not had a period in years.  Had a partial hysterectomy years ago.  . Obesity   . Pancreatitis 1997  . Stroke [facility]) 2010   with mild left arm weakness    PAST SURGICAL HISTORY :   Past Surgical History:  Procedure Laterality Date  . CESAREAN SECTION    . CHOLECYSTECTOMY    . PARTIAL HYSTERECTOMY  12/31/2016   Per patient, she has not had a period in years since she had a partial hysterectomy.  FAMILY HISTORY :   Family History  Problem Relation Age of Onset  . Ovarian cancer Mother   . Diabetes Mother   . Hypertension Mother   . COPD Father   . Hypertension Father   . Diabetes Sister   . Diabetes Brother     SOCIAL HISTORY:   Social History   Tobacco Use  . Smoking status: Former  Smoker    Packs/day: 0.50    Years: 1.00    Pack years: 0.50    Types: Cigarettes  . Smokeless tobacco: Never Used  Substance Use Topics  . Alcohol use: No    Alcohol/week: 0.0 oz  . Drug use: No    ALLERGIES:  has No Known Allergies.  MEDICATIONS:  Current Outpatient Medications  Medication Sig Dispense Refill  . acetaminophen-codeine (TYLENOL #4) 300-60 MG tablet Take 2 tablets by mouth every 6 (six) hours as needed for moderate pain.     Marland Kitchen albuterol (PROAIR HFA) 108 (90 BASE) MCG/ACT inhaler Inhale 1 puff into the lungs every 6 (six) hours as needed for shortness of breath.     Marland Kitchen albuterol (PROVENTIL) (2.5 MG/3ML) 0.083% nebulizer solution Inhale 2.5 mg into the lungs every 6 (six) hours as needed for shortness of breath.     . ALPRAZolam (XANAX) 0.5 MG tablet Take 0.5 mg by mouth at bedtime as needed for anxiety or sleep.     Marland Kitchen amLODipine (NORVASC) 10 MG tablet Take 10 mg by mouth daily.     Marland Kitchen aspirin EC 81 MG tablet Take 81 mg by mouth once.     Marland Kitchen atenolol (TENORMIN) 100 MG tablet TAKE 1 TABLETS BY MOUTH TWICE DAILY    . B-D ULTRA-FINE 33 LANCETS MISC Use 1 each 2 (two) times daily.    . B-D ULTRAFINE III SHORT PEN 31G X 8 MM MISC     . bumetanide (BUMEX) 0.5 MG tablet TAKE 1 TABLET BY MOUTH TWICE DAILY    . calcitRIOL (ROCALTROL) 0.25 MCG capsule Take 0.25 mcg by mouth 3 (three) times a week.    . Cinnamon 500 MG capsule Take 500 mg by mouth daily.     . cloNIDine (CATAPRES) 0.2 MG tablet Take 0.2 mg by mouth 2 (two) times daily.     . enalapril (VASOTEC) 20 MG tablet Take 20 mg by mouth 2 (two) times daily.     . ferrous sulfate 325 (65 FE) MG tablet Take 325 mg by mouth 2 (two) times daily with a meal.    . FLUoxetine (PROZAC) 20 MG capsule Take 20 mg by mouth 2 (two) times daily.     Marland Kitchen gabapentin (NEURONTIN) 100 MG capsule Take 1 capsule (100 mg total) by mouth at bedtime. 30 capsule 2  . glucose blood (ONE TOUCH ULTRA TEST) test strip Use 1 each 2 (two) times daily. Use as  instructed.    . glyBURIDE (DIABETA) 5 MG tablet Take 5 mg by mouth daily with breakfast.     . LEVEMIR FLEXTOUCH 100 UNIT/ML Pen Inject 55 Units into the skin daily.     . metoprolol tartrate (LOPRESSOR) 25 MG tablet TAKE 1 TABLET BY MOUTH TWICE DAILY 60 tablet 0  . mometasone (NASONEX) 50 MCG/ACT nasal spray Place 2 sprays into the nose daily as needed.     Marland Kitchen NOVOLOG FLEXPEN 100 UNIT/ML FlexPen Inject 7 Units into the skin 2 (two) times daily at 8 am and 10 pm.     . ondansetron (ZOFRAN) 8 MG tablet One  pill every 8 hours as needed for nausea/vomitting. 40 tablet 1  . prochlorperazine (COMPAZINE) 10 MG tablet Take 1 tablet (10 mg total) by mouth every 6 (six) hours as needed for nausea or vomiting. Please note change in strength 60 tablet 4  . salmeterol (SEREVENT) 50 MCG/DOSE diskus inhaler Inhale 1 puff into the lungs 2 (two) times daily.    . simvastatin (ZOCOR) 20 MG tablet Take 20 mg by mouth daily at 6 PM.     . vitamin B-12 (CYANOCOBALAMIN) 1000 MCG tablet Take 1,000 mcg by mouth daily.     No current facility-administered medications for this visit.    Facility-Administered Medications Ordered in Other Visits  Medication Dose Route Frequency Provider Last Rate Last Dose  . sodium chloride flush (NS) 0.9 % injection 10 mL  10 mL Intravenous PRN Cammie Sickle, MD   10 mL at 01/30/16 1054    PHYSICAL EXAMINATION: ECOG PERFORMANCE STATUS: 1 - Symptomatic but completely ambulatory  BP 132/85   Pulse 76   Temp (!) 97.1 F (36.2 C) (Tympanic)   Resp 20   Ht 5' 2"  (1.575 m)   Wt 197 lb (89.4 kg)   BMI 36.03 kg/m   Filed Weights   02/19/18 0834  Weight: 197 lb (89.4 kg)    GENERAL: Well-nourished well-developed; Alert, no distress and comfortable.  Alone.Marland Kitchen  EYES: no pallor or icterus OROPHARYNX: no thrush or ulceration; NECK: supple; no lymph nodes felt. LYMPH:  no palpable lymphadenopathy in the axillary or inguinal regions LUNGS: Decreased breath sounds  auscultation bilaterally. No wheeze or crackles HEART/CVS: regular rate & rhythm and no murmurs; No lower extremity edema ABDOMEN:abdomen soft, non-tender and normal bowel sounds. No hepatomegaly or splenomegaly.  Musculoskeletal:no cyanosis of digits and no clubbing  PSYCH: alert & oriented x 3 with fluent speech NEURO: no focal motor/sensory deficits SKIN:  no rashes or significant lesions    LABORATORY DATA:  I have reviewed the data as listed    Component Value Date/Time   NA 137 02/19/2018 0816   NA 130 (L) 06/06/2014 1102   K 4.3 02/19/2018 0816   K 3.9 06/06/2014 1102   CL 109 02/19/2018 0816   CL 95 (L) 06/06/2014 1102   CO2 20 (L) 02/19/2018 0816   CO2 28 06/06/2014 1102   GLUCOSE 130 (H) 02/19/2018 0816   GLUCOSE 349 (H) 06/06/2014 1102   BUN 30 (H) 02/19/2018 0816   BUN 17 06/06/2014 1102   CREATININE 2.83 (H) 02/19/2018 0816   CREATININE 1.63 (H) 06/06/2014 1102   CALCIUM 8.6 (L) 02/19/2018 0816   CALCIUM 9.2 06/06/2014 1102   PROT 6.8 02/19/2018 0816   PROT 8.2 06/06/2014 1102   ALBUMIN 3.4 (L) 02/19/2018 0816   ALBUMIN 3.3 (L) 06/06/2014 1102   AST 14 (L) 02/19/2018 0816   AST 7 (L) 06/06/2014 1102   ALT 13 02/19/2018 0816   ALT 12 (L) 06/06/2014 1102   ALKPHOS 76 02/19/2018 0816   ALKPHOS 74 06/06/2014 1102   BILITOT 0.4 02/19/2018 0816   BILITOT 0.4 06/06/2014 1102   GFRNONAA 17 (L) 02/19/2018 0816   GFRNONAA 35 (L) 06/06/2014 1102   GFRAA 20 (L) 02/19/2018 0816   GFRAA 42 (L) 06/06/2014 1102    No results found for: SPEP, UPEP  Lab Results  Component Value Date   WBC 8.9 02/19/2018   NEUTROABS 7.1 (H) 02/19/2018   HGB 9.2 (L) 02/19/2018   HCT 27.2 (L) 02/19/2018   MCV 94.0 02/19/2018  PLT 327 02/19/2018      Chemistry      Component Value Date/Time   NA 137 02/19/2018 0816   NA 130 (L) 06/06/2014 1102   K 4.3 02/19/2018 0816   K 3.9 06/06/2014 1102   CL 109 02/19/2018 0816   CL 95 (L) 06/06/2014 1102   CO2 20 (L) 02/19/2018 0816    CO2 28 06/06/2014 1102   BUN 30 (H) 02/19/2018 0816   BUN 17 06/06/2014 1102   CREATININE 2.83 (H) 02/19/2018 0816   CREATININE 1.63 (H) 06/06/2014 1102      Component Value Date/Time   CALCIUM 8.6 (L) 02/19/2018 0816   CALCIUM 9.2 06/06/2014 1102   ALKPHOS 76 02/19/2018 0816   ALKPHOS 74 06/06/2014 1102   AST 14 (L) 02/19/2018 0816   AST 7 (L) 06/06/2014 1102   ALT 13 02/19/2018 0816   ALT 12 (L) 06/06/2014 1102   BILITOT 0.4 02/19/2018 0816   BILITOT 0.4 06/06/2014 1102       RADIOGRAPHIC STUDIES: I have personally reviewed the radiological images as listed and agreed with the findings in the report. No results found.   ASSESSMENT & PLAN:  Carcinoma of upper-inner quadrant of left breast in female, estrogen receptor positive ([place]) Left breast cancer- stage IV- ER/PR +, HER-2/neu - on Taxol chemotherapy; clinical response noted improvement of tumor marker noted. July 2019- CT scan- stable bone; pleural based mets; except NEW Right LL nodular mass [ c below].  Stable  # Continue Taxol 27m/m2; Labs today reviewed;  acceptable for treatment today.   #Right lower lobe nodular mass pleural-based-likely atelectasis less likely progressive malignancy; get a PET scan for further evaluation.  # PN-2-worsening.  Cut dose of taxol 60 mg/m2. 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   # Chronic kidney disease - stage IV- creatinine 2.6; stable  # ? Genetic predisposition- sister- 645breast cancer- Bil; youngest sister- breast cyst; mom-ovarian cancer; dad's sister [80s]- pancreatic ca- refer to Genetic Counselling  # Type 2 DM- insulin dependent-stable  # Anemia- hemoglobin today 9.3. Stable.; on PO iron.   # follow up in 2 weeks/labs/MD; PET in 2 weeks; GC  # I reviewed the blood work- with the patient in detail; also reviewed the imaging independently [as summarized above]; and with the patient in detail.     Orders Placed This Encounter  Procedures  . NM  PET Image Restag (PS) Skull Base To Thigh    Standing Status:   Future    Standing Expiration Date:   02/19/2019    Order Specific Question:   ** REASON FOR EXAM (FREE TEXT)    Answer:   ? progression on CT scan; but tumor markes- improving    Order Specific Question:   If indicated for the ordered procedure, I authorize the administration of a radiopharmaceutical per Radiology protocol    Answer:   Yes    Order Specific Question:   Is the patient pregnant?    Answer:   No    Order Specific Question:   Preferred imaging location?    Answer:   [facility]    Order Specific Question:   Radiology Contrast Protocol - do NOT remove file path    Answer:   [email or web address]  . Ambulatory referral to Genetics    Referral Priority:   Routine    Referral Type:   Consultation    Referral Reason:   Specialty Services Required    Number of Visits Requested:  1   All questions were answered. The patient knows to call the clinic with any problems, questions or concerns.      Cammie Sickle, MD 02/27/2018 5:11 PM

## 2018-03-02 ENCOUNTER — Encounter: Admission: RE | Admit: 2018-03-02 | Payer: Medicare Other | Source: Ambulatory Visit

## 2018-03-05 ENCOUNTER — Inpatient Hospital Stay: Payer: Medicare Other

## 2018-03-05 ENCOUNTER — Inpatient Hospital Stay: Payer: Medicare Other | Attending: Internal Medicine

## 2018-03-05 ENCOUNTER — Inpatient Hospital Stay (HOSPITAL_BASED_OUTPATIENT_CLINIC_OR_DEPARTMENT_OTHER): Payer: Medicare Other | Admitting: Internal Medicine

## 2018-03-05 VITALS — BP 161/89 | HR 82 | Temp 97.5°F | Resp 16 | Wt 200.8 lb

## 2018-03-05 DIAGNOSIS — C7800 Secondary malignant neoplasm of unspecified lung: Secondary | ICD-10-CM

## 2018-03-05 DIAGNOSIS — C50212 Malignant neoplasm of upper-inner quadrant of left female breast: Secondary | ICD-10-CM | POA: Diagnosis not present

## 2018-03-05 DIAGNOSIS — Z803 Family history of malignant neoplasm of breast: Secondary | ICD-10-CM | POA: Insufficient documentation

## 2018-03-05 DIAGNOSIS — N184 Chronic kidney disease, stage 4 (severe): Secondary | ICD-10-CM | POA: Insufficient documentation

## 2018-03-05 DIAGNOSIS — D649 Anemia, unspecified: Secondary | ICD-10-CM | POA: Diagnosis not present

## 2018-03-05 DIAGNOSIS — Z87891 Personal history of nicotine dependence: Secondary | ICD-10-CM | POA: Diagnosis not present

## 2018-03-05 DIAGNOSIS — Z7982 Long term (current) use of aspirin: Secondary | ICD-10-CM

## 2018-03-05 DIAGNOSIS — Z170 Estrogen receptor positive status [ER+]: Secondary | ICD-10-CM | POA: Insufficient documentation

## 2018-03-05 DIAGNOSIS — Z5111 Encounter for antineoplastic chemotherapy: Secondary | ICD-10-CM | POA: Diagnosis not present

## 2018-03-05 DIAGNOSIS — C7951 Secondary malignant neoplasm of bone: Secondary | ICD-10-CM | POA: Diagnosis not present

## 2018-03-05 DIAGNOSIS — Z8673 Personal history of transient ischemic attack (TIA), and cerebral infarction without residual deficits: Secondary | ICD-10-CM | POA: Diagnosis not present

## 2018-03-05 DIAGNOSIS — E119 Type 2 diabetes mellitus without complications: Secondary | ICD-10-CM | POA: Insufficient documentation

## 2018-03-05 DIAGNOSIS — G629 Polyneuropathy, unspecified: Secondary | ICD-10-CM | POA: Insufficient documentation

## 2018-03-05 DIAGNOSIS — Z800 Family history of malignant neoplasm of digestive organs: Secondary | ICD-10-CM

## 2018-03-05 LAB — CBC WITH DIFFERENTIAL/PLATELET
Basophils Absolute: 0.1 10*3/uL (ref 0–0.1)
Basophils Relative: 1 %
Eosinophils Absolute: 0.2 10*3/uL (ref 0–0.7)
Eosinophils Relative: 2 %
HCT: 31.1 % — ABNORMAL LOW (ref 35.0–47.0)
Hemoglobin: 10.5 g/dL — ABNORMAL LOW (ref 12.0–16.0)
Lymphocytes Relative: 22 %
Lymphs Abs: 2 10*3/uL (ref 1.0–3.6)
MCH: 31.6 pg (ref 26.0–34.0)
MCHC: 33.9 g/dL (ref 32.0–36.0)
MCV: 93.1 fL (ref 80.0–100.0)
MONO ABS: 1 10*3/uL — AB (ref 0.2–0.9)
Monocytes Relative: 11 %
Neutro Abs: 5.9 10*3/uL (ref 1.4–6.5)
Neutrophils Relative %: 64 %
Platelets: 406 10*3/uL (ref 150–440)
RBC: 3.34 MIL/uL — ABNORMAL LOW (ref 3.80–5.20)
RDW: 15.6 % — ABNORMAL HIGH (ref 11.5–14.5)
WBC: 9.1 10*3/uL (ref 3.6–11.0)

## 2018-03-05 LAB — COMPREHENSIVE METABOLIC PANEL
ALBUMIN: 3.8 g/dL (ref 3.5–5.0)
ALK PHOS: 98 U/L (ref 38–126)
ALT: 15 U/L (ref 0–44)
AST: 19 U/L (ref 15–41)
Anion gap: 9 (ref 5–15)
BILIRUBIN TOTAL: 0.5 mg/dL (ref 0.3–1.2)
BUN: 27 mg/dL — AB (ref 6–20)
CO2: 22 mmol/L (ref 22–32)
Calcium: 9 mg/dL (ref 8.9–10.3)
Chloride: 107 mmol/L (ref 98–111)
Creatinine, Ser: 2.48 mg/dL — ABNORMAL HIGH (ref 0.44–1.00)
GFR calc Af Amer: 23 mL/min — ABNORMAL LOW (ref >60)
GFR, EST NON AFRICAN AMERICAN: 20 mL/min — AB (ref >60)
GLUCOSE: 76 mg/dL (ref 70–99)
Potassium: 3.6 mmol/L (ref 3.5–5.1)
Sodium: 138 mmol/L (ref 135–145)
Total Protein: 7.8 g/dL (ref 6.5–8.1)

## 2018-03-05 MED ORDER — DEXAMETHASONE SODIUM PHOSPHATE 10 MG/ML IJ SOLN
8.0000 mg | Freq: Once | INTRAMUSCULAR | Status: AC
  Administered 2018-03-05: 8 mg via INTRAVENOUS
  Filled 2018-03-05: qty 1

## 2018-03-05 MED ORDER — DIPHENHYDRAMINE HCL 50 MG/ML IJ SOLN
50.0000 mg | Freq: Once | INTRAMUSCULAR | Status: AC
  Administered 2018-03-05: 50 mg via INTRAVENOUS
  Filled 2018-03-05: qty 1

## 2018-03-05 MED ORDER — HEPARIN SOD (PORK) LOCK FLUSH 100 UNIT/ML IV SOLN
500.0000 [IU] | Freq: Once | INTRAVENOUS | Status: AC | PRN
  Administered 2018-03-05: 500 [IU]

## 2018-03-05 MED ORDER — HEPARIN SOD (PORK) LOCK FLUSH 100 UNIT/ML IV SOLN
INTRAVENOUS | Status: AC
Start: 2018-03-05 — End: ?
  Filled 2018-03-05: qty 5

## 2018-03-05 MED ORDER — SODIUM CHLORIDE 0.9 % IV SOLN
60.0000 mg/m2 | Freq: Once | INTRAVENOUS | Status: AC
  Administered 2018-03-05: 114 mg via INTRAVENOUS
  Filled 2018-03-05: qty 19

## 2018-03-05 MED ORDER — SODIUM CHLORIDE 0.9 % IV SOLN
Freq: Once | INTRAVENOUS | Status: AC
  Administered 2018-03-05: 09:00:00 via INTRAVENOUS
  Filled 2018-03-05: qty 1000

## 2018-03-05 MED ORDER — FAMOTIDINE IN NACL 20-0.9 MG/50ML-% IV SOLN
20.0000 mg | Freq: Once | INTRAVENOUS | Status: AC
  Administered 2018-03-05: 20 mg via INTRAVENOUS
  Filled 2018-03-05: qty 50

## 2018-03-05 NOTE — Assessment & Plan Note (Addendum)
Left breast cancer- stage IV- ER/PR +, HER-2/neu - on Taxol chemotherapy; clinical response noted improvement of tumor marker noted. July 2019- CT scan- stable bone; pleural based mets; except NEW Right LL nodular mass [ c below].  Stable; awaiting on PET scan.   # Continue Taxol 36m/m2; Labs today reviewed;  acceptable for treatment today.   #Right lower lobe nodular mass pleural-based-likely atelectasis less likely progressive malignancy. Await PET scan [sec to transportation issues]  # PN-2-worsening.  Cut dose of taxol 60 mg/m2. 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   # Chronic kidney disease - stage IV- creatinine 2.6; stable  # ? Genetic predisposition- sister- 680breast cancer- Bil; youngest sister- breast cyst; mom-ovarian cancer; dad's sister [80s]- pancreatic ca- refer to Genetic Counselling  # Type 2 DM- insulin dependent-stable  # Anemia- hemoglobin today 10.5 STABLE.; on PO iron.   # follow up in 2 weeks/labs/MD; PET on 7th aug; [call with with results] GC referral.

## 2018-03-05 NOTE — Progress Notes (Signed)
[facility] OFFICE PROGRESS NOTE  Patient Care Team: [name], [name], [name]; [name]; [name]; [name]; EBUS- Positive Paratrac/subcarinal LN s/p ? Taxotere [in [place]; [name]; [name];[name]; [name]; suv 3]; [name]; [name]; [name];  [name]; Ant R iliac bone; [name]; [name]; START-Taxol weekly.   # Poorly controlled Blood sugars- improved.   # Pancreatitis Hx/ CKD IV [creat ~ 3-4; [name]; [name]; mild left sided weakness]  DIAGNOSIS: [ 2015] BREAST CA; [name]; [name]    ;[name], estrogen receptor positive ([place])      INTERVAL HISTORY:  [name] 62 y.o.  female pleasant patient above history of metastatic ER PR positive HER-2 negative breast cancer currently on Taxol is here for follow-up.  Patient complains of mild tingling and numbness in her extremities.  Otherwise denies any unusual aches and pains.  No nausea no vomiting.  Appetite is fair.  No chest pain or shortness of breath or cough.  She did not have a PET scan because of transportation issues.  Review of Systems   Constitutional: Positive for malaise/fatigue. Negative for chills, diaphoresis, [name], hemoptysis, sputum production, [name], palpitations, orthopnea and leg swelling.  Gastrointestinal: Negative for abdominal pain, blood in stool, constipation, diarrhea, heartburn, melena, nausea and vomiting.  Genitourinary: Negative for dysuria, frequency and urgency.  Musculoskeletal: Negative for back pain and joint pain.  Skin: Negative.  Negative for itching and rash.  Neurological: Positive for tingling. Negative for dizziness, focal weakness, weakness and headaches.  Endo/Heme/Allergies: Does not bruise/bleed easily.  Psychiatric/Behavioral: Negative for depression. The patient is not nervous/anxious and does not have insomnia.       PAST MEDICAL HISTORY :  Past Medical History:  Diagnosis Date  . Asthma   . Cancer ([place]) 03/10/2018   Per [facility] PET order. Carcinoma of upper-inner quadrant of left breast in female, estrogen receptor positive .  Marland Kitchen CHF (congestive heart failure) ([place]) 1997  . CKD (chronic kidney disease)   . Depression   . Diabetes mellitus, type 2 ([place])   . Hair loss   . History of left breast cancer 05/29/14  . History of partial hysterectomy 12/31/2016   Per patient.  Has not had a period in years.  Had a partial hysterectomy years ago.  . Obesity   . Pancreatitis 1997  . Stroke [facility]) 2010   with mild left arm weakness    PAST SURGICAL HISTORY :   Past Surgical History:  Procedure  Laterality Date  . CESAREAN SECTION    . CHOLECYSTECTOMY    . PARTIAL HYSTERECTOMY  12/31/2016   Per patient, she has not had a period in years since she had a partial hysterectomy.    FAMILY HISTORY :   Family History  Problem Relation Age of Onset  . Ovarian cancer Mother   . Diabetes  Mother   . Hypertension Mother   . COPD Father   . Hypertension Father   . Diabetes Sister   . Diabetes Brother     SOCIAL HISTORY:   Social History   Tobacco Use  . Smoking status: Former Smoker    Packs/day: 0.50    Years: 1.00    Pack years: 0.50    Types: Cigarettes  . Smokeless tobacco: Never Used  Substance Use Topics  . Alcohol use: No    Alcohol/week: 0.0 standard drinks  . Drug use: No    ALLERGIES:  has No Known Allergies.  MEDICATIONS:  Current Outpatient Medications  Medication Sig Dispense Refill  . acetaminophen-codeine (TYLENOL #4) 300-60 MG tablet Take 2 tablets by mouth every 6 (six) hours as needed for moderate pain.     Marland Kitchen albuterol (PROAIR HFA) 108 (90 BASE) MCG/ACT inhaler Inhale 1 puff into the lungs every 6 (six) hours as needed for shortness of breath.     Marland Kitchen albuterol (PROVENTIL) (2.5 MG/3ML) 0.083% nebulizer solution Inhale 2.5 mg into the lungs every 6 (six) hours as needed for shortness of breath.     . ALPRAZolam (XANAX) 0.5 MG tablet Take 0.5 mg by mouth at bedtime as needed for anxiety or sleep.     Marland Kitchen amLODipine (NORVASC) 10 MG tablet Take 10 mg by mouth daily.     Marland Kitchen aspirin EC 81 MG tablet Take 81 mg by mouth once.     Marland Kitchen atenolol (TENORMIN) 100 MG tablet TAKE 1 TABLETS BY MOUTH TWICE DAILY    . B-D ULTRA-FINE 33 LANCETS MISC Use 1 each 2 (two) times daily.    . B-D ULTRAFINE III SHORT PEN 31G X 8 MM MISC     . bumetanide (BUMEX) 0.5 MG tablet TAKE 1 TABLET BY MOUTH TWICE DAILY    . calcitRIOL (ROCALTROL) 0.25 MCG capsule Take 0.25 mcg by mouth 3 (three) times a week.    . Cinnamon 500 MG capsule Take 500 mg by mouth daily.     . cloNIDine (CATAPRES) 0.2 MG tablet Take 0.2 mg by mouth 2 (two) times daily.     . enalapril (VASOTEC) 20 MG tablet Take 20 mg by mouth 2 (two) times daily.     . ferrous sulfate 325 (65 FE) MG tablet Take 325 mg by mouth 2 (two) times daily with a meal.    . FLUoxetine (PROZAC) 20 MG capsule Take 20 mg by mouth 2  (two) times daily.     Marland Kitchen gabapentin (NEURONTIN) 100 MG capsule Take 1 capsule (100 mg total) by mouth at bedtime. 30 capsule 2  . glucose blood (ONE TOUCH ULTRA TEST) test strip Use 1 each 2 (two) times daily. Use as instructed.    . glyBURIDE (DIABETA) 5 MG tablet Take 5 mg by mouth daily with breakfast.     . LEVEMIR FLEXTOUCH 100 UNIT/ML Pen Inject 55 Units into the skin daily.     . metoprolol tartrate (LOPRESSOR) 25 MG tablet TAKE 1 TABLET BY MOUTH TWICE DAILY 60 tablet 0  . mometasone (NASONEX) 50 MCG/ACT nasal spray Place 2 sprays into the  nose daily as needed.     Marland Kitchen NOVOLOG FLEXPEN 100 UNIT/ML FlexPen Inject 7 Units into the skin 2 (two) times daily at 8 am and 10 pm.     . ondansetron (ZOFRAN) 8 MG tablet One pill every 8 hours as needed for nausea/vomitting. 40 tablet 1  . prochlorperazine (COMPAZINE) 10 MG tablet Take 1 tablet (10 mg total) by mouth every 6 (six) hours as needed for nausea or vomiting. Please note change in strength 60 tablet 4  . salmeterol (SEREVENT) 50 MCG/DOSE diskus inhaler Inhale 1 puff into the lungs 2 (two) times daily.    . simvastatin (ZOCOR) 20 MG tablet Take 20 mg by mouth daily at 6 PM.     . vitamin B-12 (CYANOCOBALAMIN) 1000 MCG tablet Take 1,000 mcg by mouth daily.     No current facility-administered medications for this visit.    Facility-Administered Medications Ordered in Other Visits  Medication Dose Route Frequency Provider Last Rate Last Dose  . sodium chloride flush (NS) 0.9 % injection 10 mL  10 mL Intravenous PRN Cammie Sickle, MD   10 mL at 01/30/16 1054    PHYSICAL EXAMINATION: ECOG PERFORMANCE STATUS: 1 - Symptomatic but completely ambulatory  BP (!) 161/89 (BP Location: Left Arm, Patient Position: Sitting)   Pulse 82   Temp (!) 97.5 F (36.4 C) (Tympanic)   Resp 16   Wt 200 lb 12.8 oz (91.1 kg)   BMI 36.73 kg/m   Filed Weights   03/05/18 0850  Weight: 200 lb 12.8 oz (91.1 kg)    GENERAL: Well-nourished  well-developed; Alert, no distress and comfortable.  Alone.  EYES: no pallor or icterus OROPHARYNX: no thrush or ulceration; NECK: supple; no lymph nodes felt. LYMPH:  no palpable lymphadenopathy in the axillary or inguinal regions LUNGS: Decreased breath sounds auscultation bilaterally. No wheeze or crackles HEART/CVS: regular rate & rhythm and no murmurs; No lower extremity edema ABDOMEN:abdomen soft, non-tender and normal bowel sounds. No hepatomegaly or splenomegaly.  Musculoskeletal:no cyanosis of digits and no clubbing  PSYCH: alert & oriented x 3 with fluent speech NEURO: no focal motor/sensory deficits SKIN:  no rashes or significant lesions    LABORATORY DATA:  I have reviewed the data as listed    Component Value Date/Time   NA 135 03/12/2018 0952   NA 130 (L) 06/06/2014 1102   K 4.6 03/12/2018 0952   K 3.9 06/06/2014 1102   CL 107 03/12/2018 0952   CL 95 (L) 06/06/2014 1102   CO2 20 (L) 03/12/2018 0952   CO2 28 06/06/2014 1102   GLUCOSE 82 03/12/2018 0952   GLUCOSE 349 (H) 06/06/2014 1102   BUN 49 (H) 03/12/2018 0952   BUN 17 06/06/2014 1102   CREATININE 2.79 (H) 03/12/2018 0952   CREATININE 1.63 (H) 06/06/2014 1102   CALCIUM 8.8 (L) 03/12/2018 0952   CALCIUM 9.2 06/06/2014 1102   PROT 7.3 03/12/2018 0952   PROT 8.2 06/06/2014 1102   ALBUMIN 3.7 03/12/2018 0952   ALBUMIN 3.3 (L) 06/06/2014 1102   AST 17 03/12/2018 0952   AST 7 (L) 06/06/2014 1102   ALT 18 03/12/2018 0952   ALT 12 (L) 06/06/2014 1102   ALKPHOS 81 03/12/2018 0952   ALKPHOS 74 06/06/2014 1102   BILITOT 0.3 03/12/2018 0952   BILITOT 0.4 06/06/2014 1102   GFRNONAA 17 (L) 03/12/2018 0952   GFRNONAA 35 (L) 06/06/2014 1102   GFRAA 20 (L) 03/12/2018 0952   GFRAA 42 (L) 06/06/2014 1102  No results found for: SPEP, UPEP  Lab Results  Component Value Date   WBC 9.4 03/12/2018   NEUTROABS 7.7 (H) 03/12/2018   HGB 10.0 (L) 03/12/2018   HCT 29.7 (L) 03/12/2018   MCV 92.1 03/12/2018   PLT 341  03/12/2018      Chemistry      Component Value Date/Time   NA 135 03/12/2018 0952   NA 130 (L) 06/06/2014 1102   K 4.6 03/12/2018 0952   K 3.9 06/06/2014 1102   CL 107 03/12/2018 0952   CL 95 (L) 06/06/2014 1102   CO2 20 (L) 03/12/2018 0952   CO2 28 06/06/2014 1102   BUN 49 (H) 03/12/2018 0952   BUN 17 06/06/2014 1102   CREATININE 2.79 (H) 03/12/2018 0952   CREATININE 1.63 (H) 06/06/2014 1102      Component Value Date/Time   CALCIUM 8.8 (L) 03/12/2018 0952   CALCIUM 9.2 06/06/2014 1102   ALKPHOS 81 03/12/2018 0952   ALKPHOS 74 06/06/2014 1102   AST 17 03/12/2018 0952   AST 7 (L) 06/06/2014 1102   ALT 18 03/12/2018 0952   ALT 12 (L) 06/06/2014 1102   BILITOT 0.3 03/12/2018 0952   BILITOT 0.4 06/06/2014 1102       RADIOGRAPHIC STUDIES: I have personally reviewed the radiological images as listed and agreed with the findings in the report. No results found.   ASSESSMENT & PLAN:  Carcinoma of upper-inner quadrant of left breast in female, estrogen receptor positive ([place]) Left breast cancer- stage IV- ER/PR +, HER-2/neu - on Taxol chemotherapy; clinical response noted improvement of tumor marker noted. July 2019- CT scan- stable bone; pleural based mets; except NEW Right LL nodular mass [ c below].  Stable; awaiting on PET scan.   # Continue Taxol 52m/m2; Labs today reviewed;  acceptable for treatment today.   #Right lower lobe nodular mass pleural-based-likely atelectasis less likely progressive malignancy. Await PET scan [sec to transportation issues]  # PN-2-worsening.  Cut dose of taxol 60 mg/m2. 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   # Chronic kidney disease - stage IV- creatinine 2.6; stable  # ? Genetic predisposition- sister- 623breast cancer- Bil; youngest sister- breast cyst; mom-ovarian cancer; dad's sister [80s]- pancreatic ca- refer to Genetic Counselling  # Type 2 DM- insulin dependent-stable  # Anemia- hemoglobin today 10.5 STABLE.;  on PO iron.   # follow up in 2 weeks/labs/MD; PET on 7th aug; [call with with results] GC referral.     Orders Placed This Encounter  Procedures  . CBC with Differential/Platelet    Standing Status:   Standing    Number of Occurrences:   5    Standing Expiration Date:   03/06/2019  . Comprehensive metabolic panel    Standing Status:   Standing    Number of Occurrences:   5    Standing Expiration Date:   03/06/2019   All questions were answered. The patient knows to call the clinic with any problems, questions or concerns.      GCammie Sickle MD 03/19/2018 6:31 AM

## 2018-03-10 ENCOUNTER — Ambulatory Visit
Admission: RE | Admit: 2018-03-10 | Discharge: 2018-03-10 | Disposition: A | Discharge status: Home / Self Care | Payer: Medicare Other | Source: Ambulatory Visit | Attending: Internal Medicine | Admitting: Internal Medicine

## 2018-03-10 DIAGNOSIS — R918 Other nonspecific abnormal finding of lung field: Secondary | ICD-10-CM | POA: Insufficient documentation

## 2018-03-10 DIAGNOSIS — M899 Disorder of bone, unspecified: Secondary | ICD-10-CM | POA: Insufficient documentation

## 2018-03-10 DIAGNOSIS — C50212 Malignant neoplasm of upper-inner quadrant of left female breast: Secondary | ICD-10-CM

## 2018-03-10 DIAGNOSIS — J90 Pleural effusion, not elsewhere classified: Secondary | ICD-10-CM | POA: Insufficient documentation

## 2018-03-10 DIAGNOSIS — Z170 Estrogen receptor positive status [ER+]: Secondary | ICD-10-CM | POA: Insufficient documentation

## 2018-03-10 DIAGNOSIS — C50919 Malignant neoplasm of unspecified site of unspecified female breast: Secondary | ICD-10-CM | POA: Diagnosis not present

## 2018-03-10 DIAGNOSIS — C801 Malignant (primary) neoplasm, unspecified: Secondary | ICD-10-CM

## 2018-03-10 DIAGNOSIS — C7951 Secondary malignant neoplasm of bone: Secondary | ICD-10-CM | POA: Insufficient documentation

## 2018-03-10 HISTORY — DX: Malignant (primary) neoplasm, unspecified: C80.1

## 2018-03-10 LAB — GLUCOSE, CAPILLARY: GLUCOSE-CAPILLARY: 58 mg/dL — AB (ref 70–99)

## 2018-03-10 MED ORDER — FLUDEOXYGLUCOSE F - 18 (FDG) INJECTION
10.4000 | Freq: Once | INTRAVENOUS | Status: AC | PRN
  Administered 2018-03-10: 10.349 via INTRAVENOUS

## 2018-03-12 ENCOUNTER — Inpatient Hospital Stay: Payer: Medicare Other

## 2018-03-12 VITALS — BP 135/84 | HR 69 | Temp 97.3°F | Wt 203.1 lb

## 2018-03-12 DIAGNOSIS — Z7982 Long term (current) use of aspirin: Secondary | ICD-10-CM | POA: Diagnosis not present

## 2018-03-12 DIAGNOSIS — Z170 Estrogen receptor positive status [ER+]: Principal | ICD-10-CM

## 2018-03-12 DIAGNOSIS — C7800 Secondary malignant neoplasm of unspecified lung: Secondary | ICD-10-CM | POA: Diagnosis not present

## 2018-03-12 DIAGNOSIS — C50212 Malignant neoplasm of upper-inner quadrant of left female breast: Secondary | ICD-10-CM | POA: Diagnosis not present

## 2018-03-12 DIAGNOSIS — Z5111 Encounter for antineoplastic chemotherapy: Secondary | ICD-10-CM | POA: Diagnosis not present

## 2018-03-12 DIAGNOSIS — C7951 Secondary malignant neoplasm of bone: Secondary | ICD-10-CM | POA: Diagnosis not present

## 2018-03-12 LAB — CBC WITH DIFFERENTIAL/PLATELET
Basophils Absolute: 0.1 10*3/uL (ref 0–0.1)
Basophils Relative: 1 %
EOS ABS: 0.1 10*3/uL (ref 0–0.7)
EOS PCT: 1 %
HCT: 29.7 % — ABNORMAL LOW (ref 35.0–47.0)
Hemoglobin: 10 g/dL — ABNORMAL LOW (ref 12.0–16.0)
Lymphocytes Relative: 11 %
Lymphs Abs: 1 10*3/uL (ref 1.0–3.6)
MCH: 31.1 pg (ref 26.0–34.0)
MCHC: 33.8 g/dL (ref 32.0–36.0)
MCV: 92.1 fL (ref 80.0–100.0)
MONO ABS: 0.6 10*3/uL (ref 0.2–0.9)
MONOS PCT: 6 %
Neutro Abs: 7.7 10*3/uL — ABNORMAL HIGH (ref 1.4–6.5)
Neutrophils Relative %: 81 %
PLATELETS: 341 10*3/uL (ref 150–440)
RBC: 3.23 MIL/uL — AB (ref 3.80–5.20)
RDW: 15.6 % — AB (ref 11.5–14.5)
WBC: 9.4 10*3/uL (ref 3.6–11.0)

## 2018-03-12 LAB — COMPREHENSIVE METABOLIC PANEL
ALBUMIN: 3.7 g/dL (ref 3.5–5.0)
ALT: 18 U/L (ref 0–44)
AST: 17 U/L (ref 15–41)
Alkaline Phosphatase: 81 U/L (ref 38–126)
Anion gap: 8 (ref 5–15)
BUN: 49 mg/dL — AB (ref 8–23)
CHLORIDE: 107 mmol/L (ref 98–111)
CO2: 20 mmol/L — ABNORMAL LOW (ref 22–32)
CREATININE: 2.79 mg/dL — AB (ref 0.44–1.00)
Calcium: 8.8 mg/dL — ABNORMAL LOW (ref 8.9–10.3)
GFR calc Af Amer: 20 mL/min — ABNORMAL LOW (ref >60)
GFR, EST NON AFRICAN AMERICAN: 17 mL/min — AB (ref >60)
GLUCOSE: 82 mg/dL (ref 70–99)
Potassium: 4.6 mmol/L (ref 3.5–5.1)
Sodium: 135 mmol/L (ref 135–145)
Total Bilirubin: 0.3 mg/dL (ref 0.3–1.2)
Total Protein: 7.3 g/dL (ref 6.5–8.1)

## 2018-03-12 MED ORDER — HEPARIN SOD (PORK) LOCK FLUSH 100 UNIT/ML IV SOLN
500.0000 [IU] | Freq: Once | INTRAVENOUS | Status: AC | PRN
  Administered 2018-03-12: 500 [IU]
  Filled 2018-03-12: qty 5

## 2018-03-12 MED ORDER — DIPHENHYDRAMINE HCL 50 MG/ML IJ SOLN
50.0000 mg | Freq: Once | INTRAMUSCULAR | Status: AC
  Administered 2018-03-12: 50 mg via INTRAVENOUS
  Filled 2018-03-12: qty 1

## 2018-03-12 MED ORDER — SODIUM CHLORIDE 0.9% FLUSH
10.0000 mL | INTRAVENOUS | Status: DC | PRN
  Filled 2018-03-12: qty 10

## 2018-03-12 MED ORDER — SODIUM CHLORIDE 0.9 % IV SOLN
Freq: Once | INTRAVENOUS | Status: AC
  Administered 2018-03-12: 11:00:00 via INTRAVENOUS
  Filled 2018-03-12: qty 1000

## 2018-03-12 MED ORDER — SODIUM CHLORIDE 0.9 % IV SOLN
60.0000 mg/m2 | Freq: Once | INTRAVENOUS | Status: AC
  Administered 2018-03-12: 114 mg via INTRAVENOUS
  Filled 2018-03-12: qty 19

## 2018-03-12 MED ORDER — DEXAMETHASONE SODIUM PHOSPHATE 10 MG/ML IJ SOLN
8.0000 mg | Freq: Once | INTRAMUSCULAR | Status: AC
  Administered 2018-03-12: 8 mg via INTRAVENOUS
  Filled 2018-03-12: qty 1

## 2018-03-12 MED ORDER — FAMOTIDINE IN NACL 20-0.9 MG/50ML-% IV SOLN
20.0000 mg | Freq: Once | INTRAVENOUS | Status: AC
  Administered 2018-03-12: 20 mg via INTRAVENOUS
  Filled 2018-03-12: qty 50

## 2018-03-19 ENCOUNTER — Encounter: Payer: Self-pay | Admitting: Internal Medicine

## 2018-03-19 ENCOUNTER — Inpatient Hospital Stay: Payer: Medicare Other

## 2018-03-19 ENCOUNTER — Other Ambulatory Visit: Payer: Self-pay

## 2018-03-19 ENCOUNTER — Inpatient Hospital Stay (HOSPITAL_BASED_OUTPATIENT_CLINIC_OR_DEPARTMENT_OTHER): Payer: Medicare Other | Admitting: Internal Medicine

## 2018-03-19 VITALS — BP 130/87 | HR 75 | Temp 96.3°F | Resp 20 | Ht 62.0 in | Wt 202.2 lb

## 2018-03-19 DIAGNOSIS — Z170 Estrogen receptor positive status [ER+]: Secondary | ICD-10-CM | POA: Diagnosis not present

## 2018-03-19 DIAGNOSIS — C7951 Secondary malignant neoplasm of bone: Secondary | ICD-10-CM | POA: Diagnosis not present

## 2018-03-19 DIAGNOSIS — E119 Type 2 diabetes mellitus without complications: Secondary | ICD-10-CM | POA: Diagnosis not present

## 2018-03-19 DIAGNOSIS — D649 Anemia, unspecified: Secondary | ICD-10-CM

## 2018-03-19 DIAGNOSIS — Z800 Family history of malignant neoplasm of digestive organs: Secondary | ICD-10-CM | POA: Diagnosis not present

## 2018-03-19 DIAGNOSIS — Z7982 Long term (current) use of aspirin: Secondary | ICD-10-CM | POA: Diagnosis not present

## 2018-03-19 DIAGNOSIS — C7800 Secondary malignant neoplasm of unspecified lung: Secondary | ICD-10-CM | POA: Diagnosis not present

## 2018-03-19 DIAGNOSIS — G629 Polyneuropathy, unspecified: Secondary | ICD-10-CM | POA: Diagnosis not present

## 2018-03-19 DIAGNOSIS — Z803 Family history of malignant neoplasm of breast: Secondary | ICD-10-CM | POA: Diagnosis not present

## 2018-03-19 DIAGNOSIS — C50212 Malignant neoplasm of upper-inner quadrant of left female breast: Secondary | ICD-10-CM

## 2018-03-19 DIAGNOSIS — Z87891 Personal history of nicotine dependence: Secondary | ICD-10-CM | POA: Diagnosis not present

## 2018-03-19 DIAGNOSIS — N184 Chronic kidney disease, stage 4 (severe): Secondary | ICD-10-CM

## 2018-03-19 DIAGNOSIS — Z8673 Personal history of transient ischemic attack (TIA), and cerebral infarction without residual deficits: Secondary | ICD-10-CM

## 2018-03-19 DIAGNOSIS — Z5111 Encounter for antineoplastic chemotherapy: Secondary | ICD-10-CM | POA: Diagnosis not present

## 2018-03-19 LAB — CBC WITH DIFFERENTIAL/PLATELET
BASOS PCT: 1 %
Basophils Absolute: 0 10*3/uL (ref 0–0.1)
Eosinophils Absolute: 0.1 10*3/uL (ref 0–0.7)
Eosinophils Relative: 1 %
HEMATOCRIT: 29.1 % — AB (ref 35.0–47.0)
HEMOGLOBIN: 10 g/dL — AB (ref 12.0–16.0)
LYMPHS ABS: 0.9 10*3/uL — AB (ref 1.0–3.6)
LYMPHS PCT: 12 %
MCH: 31.8 pg (ref 26.0–34.0)
MCHC: 34.4 g/dL (ref 32.0–36.0)
MCV: 92.5 fL (ref 80.0–100.0)
MONOS PCT: 9 %
Monocytes Absolute: 0.6 10*3/uL (ref 0.2–0.9)
NEUTROS ABS: 5.7 10*3/uL (ref 1.4–6.5)
NEUTROS PCT: 77 %
Platelets: 322 10*3/uL (ref 150–440)
RBC: 3.15 MIL/uL — ABNORMAL LOW (ref 3.80–5.20)
RDW: 14.9 % — ABNORMAL HIGH (ref 11.5–14.5)
WBC: 7.4 10*3/uL (ref 3.6–11.0)

## 2018-03-19 LAB — COMPREHENSIVE METABOLIC PANEL
ALT: 17 U/L (ref 0–44)
AST: 21 U/L (ref 15–41)
Albumin: 3.6 g/dL (ref 3.5–5.0)
Alkaline Phosphatase: 80 U/L (ref 38–126)
Anion gap: 9 (ref 5–15)
BUN: 32 mg/dL — ABNORMAL HIGH (ref 8–23)
CO2: 19 mmol/L — ABNORMAL LOW (ref 22–32)
Calcium: 8.6 mg/dL — ABNORMAL LOW (ref 8.9–10.3)
Chloride: 109 mmol/L (ref 98–111)
Creatinine, Ser: 2.74 mg/dL — ABNORMAL HIGH (ref 0.44–1.00)
GFR calc Af Amer: 20 mL/min — ABNORMAL LOW (ref >60)
GFR calc non Af Amer: 18 mL/min — ABNORMAL LOW (ref >60)
Glucose, Bld: 115 mg/dL — ABNORMAL HIGH (ref 70–99)
Potassium: 4 mmol/L (ref 3.5–5.1)
Sodium: 137 mmol/L (ref 135–145)
Total Bilirubin: 0.4 mg/dL (ref 0.3–1.2)
Total Protein: 7 g/dL (ref 6.5–8.1)

## 2018-03-19 MED ORDER — HEPARIN SOD (PORK) LOCK FLUSH 100 UNIT/ML IV SOLN
500.0000 [IU] | Freq: Once | INTRAVENOUS | Status: AC | PRN
  Administered 2018-03-19: 500 [IU]
  Filled 2018-03-19: qty 5

## 2018-03-19 MED ORDER — DEXAMETHASONE SODIUM PHOSPHATE 10 MG/ML IJ SOLN
8.0000 mg | Freq: Once | INTRAMUSCULAR | Status: AC
  Administered 2018-03-19: 8 mg via INTRAVENOUS
  Filled 2018-03-19: qty 1

## 2018-03-19 MED ORDER — SODIUM CHLORIDE 0.9 % IV SOLN
60.0000 mg/m2 | Freq: Once | INTRAVENOUS | Status: AC
  Administered 2018-03-19: 114 mg via INTRAVENOUS
  Filled 2018-03-19: qty 19

## 2018-03-19 MED ORDER — FAMOTIDINE IN NACL 20-0.9 MG/50ML-% IV SOLN
20.0000 mg | Freq: Once | INTRAVENOUS | Status: AC
  Administered 2018-03-19: 20 mg via INTRAVENOUS
  Filled 2018-03-19: qty 50

## 2018-03-19 MED ORDER — DIPHENHYDRAMINE HCL 50 MG/ML IJ SOLN
50.0000 mg | Freq: Once | INTRAMUSCULAR | Status: AC
  Administered 2018-03-19: 50 mg via INTRAVENOUS
  Filled 2018-03-19: qty 1

## 2018-03-19 MED ORDER — SODIUM CHLORIDE 0.9 % IV SOLN
Freq: Once | INTRAVENOUS | Status: AC
  Administered 2018-03-19: 10:00:00 via INTRAVENOUS
  Filled 2018-03-19: qty 1000

## 2018-03-19 NOTE — Assessment & Plan Note (Addendum)
Left breast cancer- stage IV- ER/PR +, HER-2/neu - on Taxol chemotherapy; clinical response noted improvement of tumor marker noted. July 2019- CT scan- stable bone; pleural based mets; except NEW Right LL nodular mass [ c below].  Stable; PET- overall STABLE; RLL nodule- clinically rounded atelectasis; await TM today.   # Continue Taxol 2m/m2; Labs today reviewed;  acceptable for treatment today.   #Right lower lobe nodular mass pleural-based-likely atelectasis less likely progressive malignancy; no significant uptake noted on PET scan.  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   # Chronic kidney disease - stage IV- creatinine 2.6; STABLE.   # ? Genetic predisposition- sister- 669breast cancer- Bil; youngest sister- breast cyst; mom-ovarian cancer; dad's sister [80s]- pancreatic ca- refer to Genetic Counselling  # Type 2 DM- insulin dependent-STABLE.   # Anemia- hemoglobin today 10.2;STABLE; on PO iron.   # [facility] in 2 weeks[facility]labs/MD[facility]

## 2018-03-19 NOTE — Progress Notes (Signed)
[facility] OFFICE PROGRESS NOTE  Patient Care Team: [name], [name], [name]; [name]; [name]; [name]; EBUS- Positive Paratrac/subcarinal LN s/p ? Taxotere [in [place]; [name]; [name];[name]; [name]; suv 3]; [name]; [name]; [name];  [name]; Ant R iliac bone; [name]; [name]; START-Taxol weekly.   # Poorly controlled Blood sugars- improved.   # Pancreatitis Hx/ CKD IV [creat ~ 3-4; [name]; [name]; mild left sided weakness]  # MOLECULAR TESTING: NA []  ------------------------------------------------   DIAGNOSIS: [ 2015] BREAST CA; [name]; [name]    ;[name], estrogen receptor positive ([place])      INTERVAL HISTORY:  [name] 62 y.o.  female pleasant patient above history of ER PR positive HER-2 negative breast cancer on Taxol is here for follow-up/review the PET scan.  Complains of mild fatigue.  Mild tingling and numbness in extremities.  Otherwise no bone pain joint pain.  No worsening shortness of breath.  No swelling in the legs.  Review of Systems  Constitutional: Positive  for malaise/fatigue. Negative for chills, diaphoresis, [name], hemoptysis, sputum production, [name], palpitations, orthopnea and leg swelling.  Gastrointestinal: Negative for abdominal pain, blood in stool, constipation, diarrhea, heartburn, melena, nausea and vomiting.  Genitourinary: Negative for dysuria, frequency and urgency.  Musculoskeletal: Negative for back pain and joint pain.  Skin: Negative.  Negative for itching and rash.  Neurological: Positive for tingling. Negative for dizziness, focal weakness, weakness and headaches.  Endo/Heme/Allergies: Does not bruise/bleed easily.  Psychiatric/Behavioral: Negative for depression. The patient is not nervous/anxious and does not have insomnia.       PAST MEDICAL HISTORY :  Past Medical History:  Diagnosis Date  . Asthma   . Cancer ([place]) 03/10/2018   Per [facility] PET order. Carcinoma of upper-inner quadrant of left breast in female, estrogen receptor positive .  Marland Kitchen CHF (congestive heart failure) ([place]) 1997  . CKD (chronic kidney disease)   . Depression   . Diabetes mellitus, type 2 ([place])   . Hair loss   . History of left breast cancer 05/29/14  . History of partial hysterectomy 12/31/2016   Per patient.  Has not had a period in years.  Had a partial hysterectomy years ago.  . Obesity   . Pancreatitis 1997  . Stroke [facility]) 2010   with mild left arm weakness    PAST SURGICAL HISTORY :   Past Surgical History:  Procedure Laterality Date  . CESAREAN SECTION    .  CHOLECYSTECTOMY    . PARTIAL HYSTERECTOMY  12/31/2016   Per patient, she has not had a period in years since she had a partial hysterectomy.    FAMILY HISTORY :   Family History  Problem Relation Age of Onset  . Ovarian cancer Mother   . Diabetes Mother   . Hypertension  Mother   . COPD Father   . Hypertension Father   . Diabetes Sister   . Diabetes Brother     SOCIAL HISTORY:   Social History   Tobacco Use  . Smoking status: Former Smoker    Packs/day: 0.50    Years: 1.00    Pack years: 0.50    Types: Cigarettes  . Smokeless tobacco: Never Used  Substance Use Topics  . Alcohol use: No    Alcohol/week: 0.0 standard drinks  . Drug use: No    ALLERGIES:  has No Known Allergies.  MEDICATIONS:  Current Outpatient Medications  Medication Sig Dispense Refill  . acetaminophen-codeine (TYLENOL #4) 300-60 MG tablet Take 2 tablets by mouth every 6 (six) hours as needed for moderate pain.     Marland Kitchen albuterol (PROAIR HFA) 108 (90 BASE) MCG/ACT inhaler Inhale 1 puff into the lungs every 6 (six) hours as needed for shortness of breath.     Marland Kitchen albuterol (PROVENTIL) (2.5 MG/3ML) 0.083% nebulizer solution Inhale 2.5 mg into the lungs every 6 (six) hours as needed for shortness of breath.     . ALPRAZolam (XANAX) 0.5 MG tablet Take 0.5 mg by mouth at bedtime as needed for anxiety or sleep.     Marland Kitchen amLODipine (NORVASC) 10 MG tablet Take 10 mg by mouth daily.     Marland Kitchen aspirin EC 81 MG tablet Take 81 mg by mouth once.     Marland Kitchen atenolol (TENORMIN) 100 MG tablet TAKE 1 TABLETS BY MOUTH TWICE DAILY    . B-D ULTRA-FINE 33 LANCETS MISC Use 1 each 2 (two) times daily.    . B-D ULTRAFINE III SHORT PEN 31G X 8 MM MISC     . bumetanide (BUMEX) 0.5 MG tablet TAKE 1 TABLET BY MOUTH TWICE DAILY    . calcitRIOL (ROCALTROL) 0.25 MCG capsule Take 0.25 mcg by mouth 3 (three) times a week.    . Cinnamon 500 MG capsule Take 500 mg by mouth daily.     . cloNIDine (CATAPRES) 0.2 MG tablet Take 0.2 mg by mouth 2 (two) times daily.     . enalapril (VASOTEC) 20 MG tablet Take 20 mg by mouth 2 (two) times daily.     . ferrous sulfate 325 (65 FE) MG tablet Take 325 mg by mouth 2 (two) times daily with a meal.    . FLUoxetine (PROZAC) 20 MG capsule Take 20 mg by mouth 2 (two) times daily.     Marland Kitchen  gabapentin (NEURONTIN) 100 MG capsule Take 1 capsule (100 mg total) by mouth at bedtime. 30 capsule 2  . glucose blood (ONE TOUCH ULTRA TEST) test strip Use 1 each 2 (two) times daily. Use as instructed.    . glyBURIDE (DIABETA) 5 MG tablet Take 5 mg by mouth daily with breakfast.     . LEVEMIR FLEXTOUCH 100 UNIT/ML Pen Inject 55 Units into the skin daily.     . metoprolol tartrate (LOPRESSOR) 25 MG tablet TAKE 1 TABLET BY MOUTH TWICE DAILY 60 tablet 0  . mometasone (NASONEX) 50 MCG/ACT nasal spray Place 2 sprays into the nose daily as needed.     Marland Kitchen NOVOLOG  FLEXPEN 100 UNIT/ML FlexPen Inject 7 Units into the skin 2 (two) times daily at 8 am and 10 pm.     . ondansetron (ZOFRAN) 8 MG tablet One pill every 8 hours as needed for nausea/vomitting. 40 tablet 1  . prochlorperazine (COMPAZINE) 10 MG tablet Take 1 tablet (10 mg total) by mouth every 6 (six) hours as needed for nausea or vomiting. Please note change in strength 60 tablet 4  . salmeterol (SEREVENT) 50 MCG/DOSE diskus inhaler Inhale 1 puff into the lungs 2 (two) times daily.    . simvastatin (ZOCOR) 20 MG tablet Take 20 mg by mouth daily at 6 PM.     . vitamin B-12 (CYANOCOBALAMIN) 1000 MCG tablet Take 1,000 mcg by mouth daily.     No current facility-administered medications for this visit.    Facility-Administered Medications Ordered in Other Visits  Medication Dose Route Frequency Provider Last Rate Last Dose  . sodium chloride flush (NS) 0.9 % injection 10 mL  10 mL Intravenous PRN Cammie Sickle, MD   10 mL at 01/30/16 1054    PHYSICAL EXAMINATION: ECOG PERFORMANCE STATUS: 1 - Symptomatic but completely ambulatory  BP 130/87 (BP Location: Left Arm, Patient Position: Sitting)   Pulse 75   Temp (!) 96.3 F (35.7 C) (Tympanic)   Resp 20   Ht 5' 2"  (1.575 m)   Wt 202 lb 3.2 oz (91.7 kg)   BMI 36.98 kg/m   Filed Weights   03/19/18 0831  Weight: 202 lb 3.2 oz (91.7 kg)    Physical Exam  Constitutional: She is  oriented to person, place, and time and well-developed, well-nourished, and in no distress.  She is alone.  HENT:  Head: Normocephalic and atraumatic.  Mouth/Throat: Oropharynx is clear and moist. No oropharyngeal exudate.  Eyes: Pupils are equal, round, and reactive to light.  Neck: Normal range of motion. Neck supple.  Cardiovascular: Normal rate and regular rhythm.  Pulmonary/Chest: No respiratory distress. She has no wheezes.  Abdominal: Soft. Bowel sounds are normal. She exhibits no distension and no mass. There is no tenderness. There is no rebound and no guarding.  Musculoskeletal: Normal range of motion. She exhibits no edema or tenderness.  Neurological: She is alert and oriented to person, place, and time.  Skin: Skin is warm.  Psychiatric: Affect normal.       LABORATORY DATA:  I have reviewed the data as listed    Component Value Date/Time   NA 137 03/19/2018 0814   NA 130 (L) 06/06/2014 1102   K 4.0 03/19/2018 0814   K 3.9 06/06/2014 1102   CL 109 03/19/2018 0814   CL 95 (L) 06/06/2014 1102   CO2 19 (L) 03/19/2018 0814   CO2 28 06/06/2014 1102   GLUCOSE 115 (H) 03/19/2018 0814   GLUCOSE 349 (H) 06/06/2014 1102   BUN 32 (H) 03/19/2018 0814   BUN 17 06/06/2014 1102   CREATININE 2.74 (H) 03/19/2018 0814   CREATININE 1.63 (H) 06/06/2014 1102   CALCIUM 8.6 (L) 03/19/2018 0814   CALCIUM 9.2 06/06/2014 1102   PROT 7.0 03/19/2018 0814   PROT 8.2 06/06/2014 1102   ALBUMIN 3.6 03/19/2018 0814   ALBUMIN 3.3 (L) 06/06/2014 1102   AST 21 03/19/2018 0814   AST 7 (L) 06/06/2014 1102   ALT 17 03/19/2018 0814   ALT 12 (L) 06/06/2014 1102   ALKPHOS 80 03/19/2018 0814   ALKPHOS 74 06/06/2014 1102   BILITOT 0.4 03/19/2018 0814   BILITOT 0.4 06/06/2014  1102   GFRNONAA 18 (L) 03/19/2018 0814   GFRNONAA 35 (L) 06/06/2014 1102   GFRAA 20 (L) 03/19/2018 0814   GFRAA 42 (L) 06/06/2014 1102    No results found for: SPEP, UPEP  Lab Results  Component Value Date   WBC 7.4  03/19/2018   NEUTROABS 5.7 03/19/2018   HGB 10.0 (L) 03/19/2018   HCT 29.1 (L) 03/19/2018   MCV 92.5 03/19/2018   PLT 322 03/19/2018      Chemistry      Component Value Date/Time   NA 137 03/19/2018 0814   NA 130 (L) 06/06/2014 1102   K 4.0 03/19/2018 0814   K 3.9 06/06/2014 1102   CL 109 03/19/2018 0814   CL 95 (L) 06/06/2014 1102   CO2 19 (L) 03/19/2018 0814   CO2 28 06/06/2014 1102   BUN 32 (H) 03/19/2018 0814   BUN 17 06/06/2014 1102   CREATININE 2.74 (H) 03/19/2018 0814   CREATININE 1.63 (H) 06/06/2014 1102      Component Value Date/Time   CALCIUM 8.6 (L) 03/19/2018 0814   CALCIUM 9.2 06/06/2014 1102   ALKPHOS 80 03/19/2018 0814   ALKPHOS 74 06/06/2014 1102   AST 21 03/19/2018 0814   AST 7 (L) 06/06/2014 1102   ALT 17 03/19/2018 0814   ALT 12 (L) 06/06/2014 1102   BILITOT 0.4 03/19/2018 0814   BILITOT 0.4 06/06/2014 1102       RADIOGRAPHIC STUDIES: I have personally reviewed the radiological images as listed and agreed with the findings in the report. No results found.   ASSESSMENT & PLAN:  Carcinoma of upper-inner quadrant of left breast in female, estrogen receptor positive ([place]) Left breast cancer- stage IV- ER/PR +, HER-2/neu - on Taxol chemotherapy; clinical response noted improvement of tumor marker noted. July 2019- CT scan- stable bone; pleural based mets; except NEW Right LL nodular mass [ c below].  Stable; PET- overall STABLE; RLL nodule- clinically rounded atelectasis; await TM today.   # Continue Taxol 65m/m2; Labs today reviewed;  acceptable for treatment today.   #Right lower lobe nodular mass pleural-based-likely atelectasis less likely progressive malignancy; no significant uptake noted on PET scan.  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   # Chronic kidney disease - stage IV- creatinine 2.6; STABLE.   # ? Genetic predisposition- sister- 628breast cancer- Bil; youngest sister- breast cyst;  mom-ovarian cancer; dad's sister [80s]- pancreatic ca- refer to Genetic Counselling  # Type 2 DM- insulin dependent-STABLE.   # Anemia- hemoglobin today 10.2;STABLE; on PO iron.   # follow up in 2 weeks[facility]labs[facility]     Orders Placed This Encounter  Procedures  . Cancer antigen 27.29    Standing Status:   Future    Number of Occurrences:   1    Standing Expiration Date:   03/20/2019   All questions were answered. The patient knows to call the clinic with any problems, questions or concerns.      GCammie Sickle MD 03/23/2018 9:10 AM

## 2018-03-20 LAB — CANCER ANTIGEN 27.29: CA 27.29: 54 U/mL — ABNORMAL HIGH (ref 0.0–38.6)

## 2018-03-22 ENCOUNTER — Other Ambulatory Visit: Payer: Self-pay | Admitting: Internal Medicine

## 2018-03-22 DIAGNOSIS — Z170 Estrogen receptor positive status [ER+]: Principal | ICD-10-CM

## 2018-03-22 DIAGNOSIS — C50212 Malignant neoplasm of upper-inner quadrant of left female breast: Secondary | ICD-10-CM

## 2018-03-24 DIAGNOSIS — N184 Chronic kidney disease, stage 4 (severe): Secondary | ICD-10-CM | POA: Diagnosis not present

## 2018-03-24 DIAGNOSIS — E1122 Type 2 diabetes mellitus with diabetic chronic kidney disease: Secondary | ICD-10-CM | POA: Diagnosis not present

## 2018-03-24 DIAGNOSIS — E782 Mixed hyperlipidemia: Secondary | ICD-10-CM | POA: Diagnosis not present

## 2018-03-24 DIAGNOSIS — E119 Type 2 diabetes mellitus without complications: Secondary | ICD-10-CM | POA: Diagnosis not present

## 2018-03-24 DIAGNOSIS — C50812 Malignant neoplasm of overlapping sites of left female breast: Secondary | ICD-10-CM | POA: Diagnosis not present

## 2018-03-24 DIAGNOSIS — F3289 Other specified depressive episodes: Secondary | ICD-10-CM | POA: Diagnosis not present

## 2018-03-24 DIAGNOSIS — Z794 Long term (current) use of insulin: Secondary | ICD-10-CM | POA: Diagnosis not present

## 2018-03-24 DIAGNOSIS — R200 Anesthesia of skin: Secondary | ICD-10-CM | POA: Diagnosis not present

## 2018-03-24 DIAGNOSIS — I10 Essential (primary) hypertension: Secondary | ICD-10-CM | POA: Diagnosis not present

## 2018-04-01 ENCOUNTER — Inpatient Hospital Stay (HOSPITAL_BASED_OUTPATIENT_CLINIC_OR_DEPARTMENT_OTHER): Payer: Medicare Other | Admitting: Genetics

## 2018-04-01 ENCOUNTER — Encounter: Payer: Self-pay | Admitting: Genetics

## 2018-04-01 DIAGNOSIS — Z8042 Family history of malignant neoplasm of prostate: Secondary | ICD-10-CM

## 2018-04-01 DIAGNOSIS — C50212 Malignant neoplasm of upper-inner quadrant of left female breast: Secondary | ICD-10-CM

## 2018-04-01 DIAGNOSIS — Z8041 Family history of malignant neoplasm of ovary: Secondary | ICD-10-CM

## 2018-04-01 DIAGNOSIS — C50919 Malignant neoplasm of unspecified site of unspecified female breast: Secondary | ICD-10-CM

## 2018-04-01 DIAGNOSIS — Z170 Estrogen receptor positive status [ER+]: Secondary | ICD-10-CM

## 2018-04-01 DIAGNOSIS — Z1379 Encounter for other screening for genetic and chromosomal anomalies: Secondary | ICD-10-CM

## 2018-04-01 DIAGNOSIS — Z800 Family history of malignant neoplasm of digestive organs: Secondary | ICD-10-CM

## 2018-04-01 DIAGNOSIS — Z803 Family history of malignant neoplasm of breast: Secondary | ICD-10-CM | POA: Insufficient documentation

## 2018-04-01 NOTE — Progress Notes (Signed)
REFERRING PROVIDER: Cammie Sickle, MD [place], [place] [address]  PRIMARY PROVIDER:  [name], MD  PRIMARY REASON FOR VISIT:  1. Carcinoma of upper-inner quadrant of left breast in female, estrogen receptor positive ([place])   2. Family history of breast cancer   3. Family history of ovarian cancer   4. Family history of pancreatic cancer   5. Family history of colon cancer   6. Family history of stomach cancer   7. Family history of prostate cancer   8. Metastatic breast cancer ([place])      HISTORY OF PRESENT ILLNESS:   [name], a 62 y.o. female, was seen for a [place] cancer genetics consultation at the request of Dr. Rogue Bussing due to a personal and family history of cancer.  [name] presents to clinic today to discuss the possibility of a hereditary predisposition to cancer, genetic testing, and to further clarify her future cancer risks, as well as potential cancer risks for family members.   [name] is a 62 year old female with ER/PR+ HER2- metastatic breast cancer.    CANCER HISTORY:  Oncology History   # OCT 2015-STAGE IV LEFT BREAST T2N1 [T=4cm; [name]; [name]; [name]; EBUS- Positive Paratrac/subcarinal LN s/p ? Taxotere [in [place]; [name]; [name];[name]; [name]; suv 3]; [name]; [name]; [name];  [name]; Ant R iliac bone; [name]; [name]; START-Taxol weekly.   # Poorly controlled Blood sugars- improved.   # Pancreatitis Hx/ CKD IV [creat ~ 3-4;  [name]; [name]; mild left sided weakness]  # MOLECULAR TESTING: NA []  ------------------------------------------------   DIAGNOSIS: [ 2015] BREAST CA; [name]; [name]    ;[name], estrogen receptor positive ([place])     HORMONAL RISK FACTORS:  Ovaries intact: yes.  Hysterectomy: yes. fibroids Menopausal status: postmenopausal.    Past Medical History:  Diagnosis Date  . Asthma   . Cancer (Start) 03/10/2018   Per [facility] PET order. Carcinoma of upper-inner quadrant of left breast in female, estrogen receptor positive .  Marland Kitchen CHF (congestive heart failure) ([place]) 1997  . CKD (chronic kidney disease)   . Depression   . Diabetes mellitus, type 2 ([place])   . Family history of breast cancer   . Family history of colon cancer   . Family history of ovarian cancer   . Family history of pancreatic cancer   . Family history of prostate cancer   . Family history of stomach cancer   . Hair loss   . History of left breast cancer 05/29/14  . History of partial hysterectomy 12/31/2016   Per patient.  Has not had a period in years.  Had a partial hysterectomy years ago.  . Obesity   . Pancreatitis 1997  . Stroke [facility]) 2010   with mild left arm weakness    Past Surgical History:  Procedure Laterality Date  . CESAREAN SECTION    .  CHOLECYSTECTOMY    . PARTIAL HYSTERECTOMY  12/31/2016   Per patient, she has not had a period in years since she had a partial hysterectomy.    Social History   Socioeconomic History  . Marital status: Single    Spouse name: Not on file  . Number of children: Not on file  . Years of education: Not on file  . Highest education level: Not on file  Occupational History  . Not on file  Social Needs  . Financial resource strain: Not on file  . Food insecurity:    Worry: Not on file    Inability: Not on file   . Transportation needs:    Medical: Not on file    Non-medical: Not on file  Tobacco Use  . Smoking status: Former Smoker    Packs/day: 0.50    Years: 1.00    Pack years: 0.50    Types: Cigarettes  . Smokeless tobacco: Never Used  Substance and Sexual Activity  . Alcohol use: No    Alcohol/week: 0.0 standard drinks  . Drug use: No  . Sexual activity: Not on file    Comment: quit 8 years ago  Lifestyle  . Physical activity:    Days per week: Not on file    Minutes per session: Not on file  . Stress: Not on file  Relationships  . Social connections:    Talks on phone: Not on file    Gets together: Not on file    Attends religious service: Not on file    Active member of club or organization: Not on file    Attends meetings of clubs or organizations: Not on file    Relationship status: Not on file  Other Topics Concern  . Not on file  Social History Narrative  . Not on file     FAMILY HISTORY:  We obtained a detailed, 4-generation family history.  Significant diagnoses are listed below: Family History  Problem Relation Age of Onset  . Ovarian cancer Mother 16  . Diabetes Mother   . Hypertension Mother   . COPD Father   . Hypertension Father   . Colon cancer Father 15  . Diabetes Sister   . Breast cancer Sister 53       bilateral  . Diabetes Brother   . Leukemia Maternal Aunt   . Pancreatic cancer Paternal Aunt 28  . Pancreatic cancer Paternal Uncle   . Colon cancer Paternal Uncle   . Stomach cancer Maternal Grandfather 72  . Throat cancer Paternal Grandmother   . Breast cancer Maternal Aunt 80  . Colon cancer Maternal Aunt   . Bone cancer Maternal Aunt   . Breast cancer Paternal Aunt        dx >50  . Prostate cancer Paternal Uncle   . Pancreatic cancer Paternal Uncle   . Throat cancer Paternal Uncle   . Lung cancer Paternal Uncle   . Stomach cancer Paternal Uncle   . Brain cancer Paternal Aunt   . Cancer Cousin        liver, kidney  . Prostate cancer  Cousin        meastatic  . Lung cancer Other     [name] has a 37 year-old daughter and identical twin sons ages 74 with no history of cancer.  [name] reports having several stillborn children as well.  [name] has 2 sisters and 3 brothers: -1 sister is in her 44's and has no history of  cancer, she has had some breast cysts -1 sister is 69 and has stage IV bilateral breast cancer.   -3 brothers in their 11's with no history of cancer.   [name] father: died at 65 due to colon cancer dx at 53.  Paternal Aunts/Uncles: 80 paternal aunts/uncles in total: -1 paternal aunt died of pancreatic cancer in her 40's -1 paternal aunt died of brain cancer -1 paternal aunt had breast cancer dx >50 -1 paternal aunt died of a heart attack -1 paternal aunt died of ruptured appendix at 31 -2 paternal aunts/ucnles died as babies -1 paternal uncle had throat and lung cancer -1 paternal uncle had lung cancer -1 paternal uncle had colon and pancreatic cancer -1 paternal uncle had pancreatic cancer, he had a hx of alcohol abuse. -1 paternal uncle died of stomach cancer -1 paternal uncle died of prostate cancer Paternal cousins: 1 paternal cousin is in hospice dying of liver/kidney cancer, 1 paternal cousin died of metastatic prostate cancer Paternal grandfather: unk Paternal grandmother:throat cancer  [name] mother: died at 54 due to ovarian cancer dx at 72, it metastasized to her lung and brain.  Maternal Aunts/Uncles: about 12 maternal aunts/uncles, several maternal half/aunts/uncles.  1 maternal half aunt died of leukemia.  -1 maternal aunt died of bone cancer -1 maternal aunt had a hx of colon cancer dx at 37.  She had a granddaughter who had breast cancer at 29.  -1 maternal aunt had breast cancer dx in her 14's, she is now 54 Maternal cousins: some cancer, details unk Maternal grandfather: died of stomach cancer in his 63's/80's Maternal grandmother:died young due to bleeding  during pregnancy/fall.  She had a sister who had lung cancer.   [name] is unaware of previous family history of genetic testing for hereditary cancer risks. Patient's maternal ancestors are of African American/Caucasian descent, and paternal ancestors are of African American/Native American descent. There is no reported Ashkenazi Jewish ancestry. There is no known consanguinity.  GENETIC COUNSELING ASSESSMENT: [name] is a 62 y.o. female with a personal and family history which is somewhat suggestive of a Hereditary Cancer Predisposition Syndrome. We, therefore, discussed and recommended the following at today's visit.   DISCUSSION: We reviewed the characteristics, features and inheritance patterns of hereditary cancer syndromes. We also discussed genetic testing, including the appropriate family members to test, the process of testing, insurance coverage and turn-around-time for results. We discussed the implications of a negative, positive and/or variant of uncertain significant result. We recommended [name] pursue genetic testing for the Multi-Cancer gene panel.   The Multi-Cancer Panel offered by [facility] includes sequencing and/or deletion duplication testing of the following 84 genes: AIP,ALK, APC, ATM, AXIN2,BAP1,  BARD1, BLM, BMPR1A, BRCA1, BRCA2, BRIP1, CASR, CDC73, CDH1, CDK4, CDKN1B, CDKN1C, CDKN2A (p14ARF), CDKN2A (p16INK4a), CEBPA, CHEK2, CTNNA1, DICER1, DIS3L2, EGFR (c.2369C>T, p.Thr790Met variant only), EPCAM (Deletion/duplication testing only), FH, FLCN, GATA2, GPC3, GREM1 (Promoter region deletion/duplication testing only), HOXB13 (c.251G>A, p.Gly84Glu), HRAS, KIT, MAX, MEN1, MET, MITF (c.952G>A, p.Glu318Lys variant only), MLH1, MSH2, MSH3, MSH6, MUTYH, NBN, NF1, NF2, NTHL1, PALB2, PDGFRA, PHOX2B, PMS2, POLD1, POLE, POT1, PRKAR1A, PTCH1, PTEN, RAD50, RAD51C, RAD51D, RB1, RECQL4, RET, RUNX1, SDHAF2, SDHA (sequence changes only), SDHB, SDHC, SDHD, SMAD4, SMARCA4, SMARCB1,  SMARCE1, STK11, SUFU, TERC, TERT, TMEM127, TP53, TSC1, TSC2, VHL, WRN and WT1.   We discussed that only 5-10% of cancers are associated with a Hereditary cancer predisposition syndrome.  One of the most common hereditary cancer syndromes that increases breast cancer risk  is called Hereditary Breast and Ovarian Cancer (HBOC) syndrome.  This syndrome is caused by mutations in the BRCA1 and BRCA2 genes.  This syndrome increases an individual's lifetime risk to develop breast, ovarian, pancreatic, and other types of cancer.  There are also many other cancer predisposition syndromes caused by mutations in several other genes.  We discussed that if she is found to have a mutation in one of these genes, it may impact future medical management recommendations such as increased cancer screenings and consideration of risk reducing surgeries.  A positive result could also have implications for the patient's family members.  A Negative result would mean we were unable to identify a hereditary component to her cancer, but does not rule out the possibility of a hereditary basis for her cancer.  There could be mutations that are undetectable by current technology, or in genes not yet tested or identified to increase cancer risk.    We discussed the potential to find a Variant of Uncertain Significance or VUS.  These are variants that have not yet been identified as pathogenic or benign, and it is unknown if this variant is associated with increased cancer risk or if this is a normal finding.  Most VUS's are reclassified to benign or likely benign.   It should not be used to make medical management decisions. With time, we suspect the lab will determine the significance of any VUS's identified if any.   Based on Ms. Younkin's personal and family history of cancer, she meets medical criteria for genetic testing. Despite that she meets criteria, she may still have an out of pocket cost. The laboratory can provide her with an  estimate of her OOP cost.  she was given the contact information for the laboratory if she has further questions. Marland Kitchen   PLAN: After considering the risks, benefits, and limitations, [name]  provided informed consent to pursue genetic testing.  Her blood will be drawn at her lab appointment tomorrow 04/02/18 and will be sent to [facility] for analysis of the Multi-Cancer Panel. Results should be available within approximately 2-3 weeks' time, at which point they will be disclosed by telephone to [name], as will any additional recommendations warranted by these results. [name] will receive a summary of her genetic counseling visit and a copy of her results once available. This information will also be available in Epic. We encouraged [name] to remain in contact with cancer genetics annually so that we can continuously update the family history and inform her of any changes in cancer genetics and testing that may be of benefit for her family. [name] questions were answered to her satisfaction today. Our contact information was provided should additional questions or concerns arise.  Based on [name] family history, we recommended her relatives (especially those affected with cancer) also, have genetic counseling and testing. Ms. Wilms will let us know if we can be of any assistance in coordinating genetic counseling and/or testing for this family member.   Lastly, we encouraged Ms. Montoya to remain in contact with cancer genetics annually so that we can continuously update the family history and inform her of any changes in cancer genetics and testing that may be of benefit for this family.   Ms.  Lindor questions were answered to her satisfaction today. Our contact information was provided should additional questions or concerns arise. Thank you for the referral and allowing [place] to share in the care of your patient.   Ria Comment  Bethann Humble, MS, [facility] Certified Genetic  Counselor [email or web address] phone: [phone]  The patient was seen for a total of 60 minutes in face-to-face genetic counseling.

## 2018-04-02 ENCOUNTER — Encounter: Payer: Self-pay | Admitting: Internal Medicine

## 2018-04-02 ENCOUNTER — Inpatient Hospital Stay (HOSPITAL_BASED_OUTPATIENT_CLINIC_OR_DEPARTMENT_OTHER): Payer: Medicare Other | Admitting: Internal Medicine

## 2018-04-02 ENCOUNTER — Inpatient Hospital Stay: Payer: Medicare Other

## 2018-04-02 ENCOUNTER — Other Ambulatory Visit: Payer: Self-pay

## 2018-04-02 VITALS — BP 142/90 | HR 70 | Temp 97.6°F | Resp 20

## 2018-04-02 DIAGNOSIS — C7951 Secondary malignant neoplasm of bone: Secondary | ICD-10-CM

## 2018-04-02 DIAGNOSIS — Z5111 Encounter for antineoplastic chemotherapy: Secondary | ICD-10-CM | POA: Diagnosis not present

## 2018-04-02 DIAGNOSIS — Z170 Estrogen receptor positive status [ER+]: Principal | ICD-10-CM

## 2018-04-02 DIAGNOSIS — C7800 Secondary malignant neoplasm of unspecified lung: Secondary | ICD-10-CM | POA: Diagnosis not present

## 2018-04-02 DIAGNOSIS — Z87891 Personal history of nicotine dependence: Secondary | ICD-10-CM

## 2018-04-02 DIAGNOSIS — C50212 Malignant neoplasm of upper-inner quadrant of left female breast: Secondary | ICD-10-CM

## 2018-04-02 DIAGNOSIS — Z7982 Long term (current) use of aspirin: Secondary | ICD-10-CM | POA: Diagnosis not present

## 2018-04-02 LAB — COMPREHENSIVE METABOLIC PANEL
ALT: 17 U/L (ref 0–44)
ANION GAP: 8 (ref 5–15)
AST: 19 U/L (ref 15–41)
Albumin: 3.8 g/dL (ref 3.5–5.0)
Alkaline Phosphatase: 79 U/L (ref 38–126)
BUN: 32 mg/dL — ABNORMAL HIGH (ref 8–23)
CALCIUM: 8.9 mg/dL (ref 8.9–10.3)
CHLORIDE: 107 mmol/L (ref 98–111)
CO2: 24 mmol/L (ref 22–32)
Creatinine, Ser: 3.03 mg/dL — ABNORMAL HIGH (ref 0.44–1.00)
GFR calc Af Amer: 18 mL/min — ABNORMAL LOW (ref >60)
GFR calc non Af Amer: 16 mL/min — ABNORMAL LOW (ref >60)
Glucose, Bld: 66 mg/dL — ABNORMAL LOW (ref 70–99)
Potassium: 3.9 mmol/L (ref 3.5–5.1)
SODIUM: 139 mmol/L (ref 135–145)
Total Bilirubin: 0.5 mg/dL (ref 0.3–1.2)
Total Protein: 7.2 g/dL (ref 6.5–8.1)

## 2018-04-02 LAB — CBC WITH DIFFERENTIAL/PLATELET
BASOS PCT: 0 %
Basophils Absolute: 0 10*3/uL (ref 0–0.1)
Eosinophils Absolute: 0.1 10*3/uL (ref 0–0.7)
Eosinophils Relative: 1 %
HEMATOCRIT: 29.2 % — AB (ref 35.0–47.0)
HEMOGLOBIN: 10 g/dL — AB (ref 12.0–16.0)
LYMPHS ABS: 1.3 10*3/uL (ref 1.0–3.6)
Lymphocytes Relative: 14 %
MCH: 31.3 pg (ref 26.0–34.0)
MCHC: 34.1 g/dL (ref 32.0–36.0)
MCV: 91.8 fL (ref 80.0–100.0)
MONOS PCT: 12 %
Monocytes Absolute: 1 10*3/uL — ABNORMAL HIGH (ref 0.2–0.9)
NEUTROS ABS: 6.4 10*3/uL (ref 1.4–6.5)
NEUTROS PCT: 73 %
Platelets: 310 10*3/uL (ref 150–440)
RBC: 3.18 MIL/uL — ABNORMAL LOW (ref 3.80–5.20)
RDW: 15 % — ABNORMAL HIGH (ref 11.5–14.5)
WBC: 8.9 10*3/uL (ref 3.6–11.0)

## 2018-04-02 MED ORDER — DIPHENHYDRAMINE HCL 50 MG/ML IJ SOLN
50.0000 mg | Freq: Once | INTRAMUSCULAR | Status: AC
  Administered 2018-04-02: 50 mg via INTRAVENOUS
  Filled 2018-04-02: qty 1

## 2018-04-02 MED ORDER — HEPARIN SOD (PORK) LOCK FLUSH 100 UNIT/ML IV SOLN
500.0000 [IU] | Freq: Once | INTRAVENOUS | Status: AC
  Administered 2018-04-02: 500 [IU] via INTRAVENOUS
  Filled 2018-04-02: qty 5

## 2018-04-02 MED ORDER — SODIUM CHLORIDE 0.9% FLUSH
10.0000 mL | INTRAVENOUS | Status: DC | PRN
  Administered 2018-04-02: 10 mL via INTRAVENOUS
  Filled 2018-04-02: qty 10

## 2018-04-02 MED ORDER — SODIUM CHLORIDE 0.9 % IV SOLN
60.0000 mg/m2 | Freq: Once | INTRAVENOUS | Status: AC
  Administered 2018-04-02: 114 mg via INTRAVENOUS
  Filled 2018-04-02: qty 19

## 2018-04-02 MED ORDER — DEXAMETHASONE SODIUM PHOSPHATE 10 MG/ML IJ SOLN
8.0000 mg | Freq: Once | INTRAMUSCULAR | Status: AC
  Administered 2018-04-02: 8 mg via INTRAVENOUS
  Filled 2018-04-02: qty 1

## 2018-04-02 MED ORDER — HEPARIN SOD (PORK) LOCK FLUSH 100 UNIT/ML IV SOLN: 500.0000 [IU] | Freq: Once | INTRAVENOUS | Status: DC | PRN

## 2018-04-02 MED ORDER — FAMOTIDINE IN NACL 20-0.9 MG/50ML-% IV SOLN
20.0000 mg | Freq: Once | INTRAVENOUS | Status: AC
  Administered 2018-04-02: 20 mg via INTRAVENOUS
  Filled 2018-04-02: qty 50

## 2018-04-02 MED ORDER — SODIUM CHLORIDE 0.9 % IV SOLN
Freq: Once | INTRAVENOUS | Status: AC
  Administered 2018-04-02: 10:00:00 via INTRAVENOUS
  Filled 2018-04-02: qty 250

## 2018-04-02 NOTE — Assessment & Plan Note (Addendum)
Left breast cancer- stage IV- ER/PR +, HER-2/neu - on Taxol chemotherapy;  July 2019/AUG 2019- PET- overall STABLE; RLL nodule- clinically rounded atelectasis. STABLE. TM-coming down.   # Continue Taxol 14m/m2; Labs today reviewed;  Acceptable for treatment today.   #Right lower lobe nodular mass pleural-based-likely atelectasis less likely progressive malignancy; no significant uptake noted on PET scan. STABLE. 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   # Chronic kidney disease - stage IV- creatinine 3.03; STABLE.   # ? Genetic predisposition- sister- 640breast cancer- Bil; youngest sister- breast cyst; mom-ovarian cancer; dad's sister [80s]- pancreatic ca- s/p Genetic counseling [8/29]- pending.  # Type 2 DM- insulin dependent-STABLE.   # Anemia- hemoglobin today 10.2;STABLE; on PO iron.   # follow up in 2 weeks/labs/MD/Taxol. 1 week-taxol/cbc.

## 2018-04-02 NOTE — Progress Notes (Signed)
[facility] OFFICE PROGRESS NOTE  Patient Care Team: [name], [name], [name]; [name]; [name]; [name]; EBUS- Positive Paratrac/subcarinal LN s/p ? Taxotere [in [place]; [name]; [name];[name]; [name]; suv 3]; [name]; [name]; [name];  [name]; Ant R iliac bone; [name]; [name]; START-Taxol weekly.   # Poorly controlled Blood sugars- improved.   # Pancreatitis Hx/ CKD IV [creat ~ 3-4; [name]; [name]; mild left sided weakness]  # MOLECULAR TESTING: NA '[]'  ------------------------------------------------   DIAGNOSIS: [ 2015] BREAST CA; [name]; [name]    ;[name], estrogen receptor positive ([name])      INTERVAL HISTORY:  [name] 62 y.o.  female pleasant patient above history of ER PR positive HER-2 negative breast cancer on Taxol is here for follow-up.  Patient complains of mild tingling and numbness in extremities.  Mild fatigue.  Otherwise no bone pain joint pain.  No swelling of the legs or shortness of breath or cough.  No fever chills.   Review of Systems  Constitutional:  Positive for malaise/fatigue. Negative for chills, diaphoresis, [name], hemoptysis, sputum production, [name], palpitations, orthopnea and leg swelling.  Gastrointestinal: Negative for abdominal pain, blood in stool, constipation, diarrhea, heartburn, melena, nausea and vomiting.  Genitourinary: Negative for dysuria, frequency and urgency.  Musculoskeletal: Negative for back pain and joint pain.  Skin: Negative.  Negative for itching and rash.  Neurological: Positive for tingling. Negative for dizziness, focal weakness, weakness and headaches.  Endo/Heme/Allergies: Does not bruise/bleed easily.  Psychiatric/Behavioral: Negative for depression. The patient is not nervous/anxious and does not have insomnia.       PAST MEDICAL HISTORY :  Past Medical History:  Diagnosis Date  . Asthma   . Cancer ([place]) 03/10/2018   Per [facility] PET order. Carcinoma of upper-inner quadrant of left breast in female, estrogen receptor positive .  Marland Kitchen CHF (congestive heart failure) ([place]) 1997  . CKD (chronic kidney disease)   . Depression   . Diabetes mellitus, type 2 ([place])   . Family history of breast cancer   . Family history of colon cancer   . Family history of ovarian cancer   . Family history of pancreatic cancer   . Family history of prostate cancer   . Family history of stomach cancer   . Hair loss   . History of left breast cancer 05/29/14  . History of partial hysterectomy 12/31/2016   Per patient.  Has not had a period in years.  Had a partial hysterectomy years ago.  . Obesity   . Pancreatitis 1997  . Stroke [facility]) 2010   with mild left arm weakness    PAST SURGICAL HISTORY :   Past Surgical History:  Procedure Laterality Date  . CESAREAN SECTION    . CHOLECYSTECTOMY    . PARTIAL HYSTERECTOMY   12/31/2016   Per patient, she has not had a period in years since she had a partial hysterectomy.    FAMILY HISTORY :   Family History  Problem Relation Age of Onset  . Ovarian cancer Mother 16  . Diabetes Mother   . Hypertension Mother   . COPD Father   . Hypertension Father   . Colon cancer Father 82  . Diabetes Sister   . Breast cancer Sister 1       bilateral  . Diabetes Brother   . Leukemia Maternal Aunt   . Pancreatic cancer Paternal Aunt 18  . Pancreatic cancer Paternal Uncle   . Colon cancer Paternal Uncle   . Stomach cancer Maternal Grandfather 60  . Throat cancer Paternal Grandmother   . Breast cancer Maternal Aunt 80  . Colon cancer Maternal Aunt   . Bone cancer Maternal Aunt   . Breast cancer Paternal Aunt        dx >50  . Prostate cancer Paternal Uncle   . Pancreatic cancer Paternal Uncle   . Throat cancer Paternal Uncle   . Lung cancer Paternal Uncle   . Stomach cancer Paternal Uncle   . Brain cancer Paternal Aunt   . Cancer Cousin        liver, kidney  . Prostate cancer Cousin        meastatic  . Lung cancer Other     SOCIAL HISTORY:   Social History   Tobacco Use  . Smoking status: Former Smoker    Packs/day: 0.50    Years: 1.00    Pack years: 0.50    Types: Cigarettes  . Smokeless tobacco: Never Used  Substance Use Topics  . Alcohol use: No    Alcohol/week: 0.0 standard drinks  . Drug use: No    ALLERGIES:  has No Known Allergies.  MEDICATIONS:  Current Outpatient Medications  Medication Sig Dispense Refill  . acetaminophen-codeine (TYLENOL #4) 300-60 MG tablet Take 2 tablets by mouth every 6 (six) hours as needed for moderate pain.     Marland Kitchen albuterol (PROAIR HFA) 108 (90 BASE) MCG/ACT inhaler Inhale 1 puff into the lungs every 6 (six) hours as needed for shortness of breath.     Marland Kitchen albuterol (PROVENTIL) (2.5 MG/3ML) 0.083% nebulizer solution Inhale 2.5 mg into the lungs every 6 (six) hours as needed for shortness of breath.     .  ALPRAZolam (XANAX) 0.5 MG tablet Take 0.5 mg by mouth at bedtime as needed for anxiety or sleep.     Marland Kitchen amLODipine (NORVASC) 10 MG tablet Take 10 mg by mouth daily.     Marland Kitchen aspirin EC 81 MG tablet Take 81 mg by mouth once.     Marland Kitchen atenolol (TENORMIN) 100 MG tablet TAKE 1 TABLETS BY MOUTH TWICE DAILY    . B-D ULTRA-FINE 33 LANCETS MISC Use 1 each 2 (two) times daily.    . B-D ULTRAFINE III SHORT PEN 31G X 8 MM MISC     . bumetanide (BUMEX) 0.5 MG tablet TAKE 1 TABLET BY MOUTH TWICE DAILY    . calcitRIOL (ROCALTROL) 0.25 MCG capsule Take 0.25 mcg by mouth  3 (three) times a week.    . Cinnamon 500 MG capsule Take 500 mg by mouth daily.     . cloNIDine (CATAPRES) 0.2 MG tablet Take 0.2 mg by mouth 2 (two) times daily.     . enalapril (VASOTEC) 20 MG tablet Take 20 mg by mouth 2 (two) times daily.     . ferrous sulfate 325 (65 FE) MG tablet Take 325 mg by mouth 2 (two) times daily with a meal.    . FLUoxetine (PROZAC) 20 MG capsule Take 20 mg by mouth 2 (two) times daily.     Marland Kitchen gabapentin (NEURONTIN) 100 MG capsule Take 1 capsule (100 mg total) by mouth at bedtime. 30 capsule 2  . glucose blood (ONE TOUCH ULTRA TEST) test strip Use 1 each 2 (two) times daily. Use as instructed.    . glyBURIDE (DIABETA) 5 MG tablet Take 5 mg by mouth daily with breakfast.     . LEVEMIR FLEXTOUCH 100 UNIT/ML Pen Inject 55 Units into the skin daily.     . metoprolol tartrate (LOPRESSOR) 25 MG tablet TAKE 1 TABLET BY MOUTH TWICE DAILY 60 tablet 0  . mometasone (NASONEX) 50 MCG/ACT nasal spray Place 2 sprays into the nose daily as needed.     Marland Kitchen NOVOLOG FLEXPEN 100 UNIT/ML FlexPen Inject 7 Units into the skin 2 (two) times daily at 8 am and 10 pm.     . ondansetron (ZOFRAN) 8 MG tablet One pill every 8 hours as needed for nausea/vomitting. 40 tablet 1  . prochlorperazine (COMPAZINE) 10 MG tablet Take 1 tablet (10 mg total) by mouth every 6 (six) hours as needed for nausea or vomiting. Please note change in strength 60 tablet 4   . salmeterol (SEREVENT) 50 MCG/DOSE diskus inhaler Inhale 1 puff into the lungs 2 (two) times daily.    . simvastatin (ZOCOR) 20 MG tablet Take 20 mg by mouth daily at 6 PM.     . vitamin B-12 (CYANOCOBALAMIN) 1000 MCG tablet Take 1,000 mcg by mouth daily.     No current facility-administered medications for this visit.    Facility-Administered Medications Ordered in Other Visits  Medication Dose Route Frequency Provider Last Rate Last Dose  . heparin lock flush 100 unit/mL  500 Units Intravenous Once Charlaine Dalton R, MD      . sodium chloride flush (NS) 0.9 % injection 10 mL  10 mL Intravenous PRN Cammie Sickle, MD   10 mL at 01/30/16 1054  . sodium chloride flush (NS) 0.9 % injection 10 mL  10 mL Intravenous PRN Cammie Sickle, MD   10 mL at 04/02/18 0809    PHYSICAL EXAMINATION: ECOG PERFORMANCE STATUS: 1 - Symptomatic but completely ambulatory  BP (!) 142/90   Pulse 70   Temp 97.6 F (36.4 C) (Tympanic)   Resp 20   There were no vitals filed for this visit.  Physical Exam  Constitutional: She is oriented to person, place, and time and well-developed, well-nourished, and in no distress.  She is alone.  HENT:  Head: Normocephalic and atraumatic.  Mouth/Throat: Oropharynx is clear and moist. No oropharyngeal exudate.  Eyes: Pupils are equal, round, and reactive to light.  Neck: Normal range of motion. Neck supple.  Cardiovascular: Normal rate and regular rhythm.  Pulmonary/Chest: No respiratory distress. She has no wheezes.  Abdominal: Soft. Bowel sounds are normal. She exhibits no distension and no mass. There is no tenderness. There is no rebound and no guarding.  Musculoskeletal: Normal  range of motion. She exhibits no edema or tenderness.  Neurological: She is alert and oriented to person, place, and time.  Skin: Skin is warm.  Psychiatric: Affect normal.       LABORATORY DATA:  I have reviewed the data as listed    Component Value Date/Time    NA 139 04/02/2018 0809   NA 130 (L) 06/06/2014 1102   K 3.9 04/02/2018 0809   K 3.9 06/06/2014 1102   CL 107 04/02/2018 0809   CL 95 (L) 06/06/2014 1102   CO2 24 04/02/2018 0809   CO2 28 06/06/2014 1102   GLUCOSE 66 (L) 04/02/2018 0809   GLUCOSE 349 (H) 06/06/2014 1102   BUN 32 (H) 04/02/2018 0809   BUN 17 06/06/2014 1102   CREATININE 3.03 (H) 04/02/2018 0809   CREATININE 1.63 (H) 06/06/2014 1102   CALCIUM 8.9 04/02/2018 0809   CALCIUM 9.2 06/06/2014 1102   PROT 7.2 04/02/2018 0809   PROT 8.2 06/06/2014 1102   ALBUMIN 3.8 04/02/2018 0809   ALBUMIN 3.3 (L) 06/06/2014 1102   AST 19 04/02/2018 0809   AST 7 (L) 06/06/2014 1102   ALT 17 04/02/2018 0809   ALT 12 (L) 06/06/2014 1102   ALKPHOS 79 04/02/2018 0809   ALKPHOS 74 06/06/2014 1102   BILITOT 0.5 04/02/2018 0809   BILITOT 0.4 06/06/2014 1102   GFRNONAA 16 (L) 04/02/2018 0809   GFRNONAA 35 (L) 06/06/2014 1102   GFRAA 18 (L) 04/02/2018 0809   GFRAA 42 (L) 06/06/2014 1102    No results found for: SPEP, UPEP  Lab Results  Component Value Date   WBC 8.9 04/02/2018   NEUTROABS 6.4 04/02/2018   HGB 10.0 (L) 04/02/2018   HCT 29.2 (L) 04/02/2018   MCV 91.8 04/02/2018   PLT 310 04/02/2018      Chemistry      Component Value Date/Time   NA 139 04/02/2018 0809   NA 130 (L) 06/06/2014 1102   K 3.9 04/02/2018 0809   K 3.9 06/06/2014 1102   CL 107 04/02/2018 0809   CL 95 (L) 06/06/2014 1102   CO2 24 04/02/2018 0809   CO2 28 06/06/2014 1102   BUN 32 (H) 04/02/2018 0809   BUN 17 06/06/2014 1102   CREATININE 3.03 (H) 04/02/2018 0809   CREATININE 1.63 (H) 06/06/2014 1102      Component Value Date/Time   CALCIUM 8.9 04/02/2018 0809   CALCIUM 9.2 06/06/2014 1102   ALKPHOS 79 04/02/2018 0809   ALKPHOS 74 06/06/2014 1102   AST 19 04/02/2018 0809   AST 7 (L) 06/06/2014 1102   ALT 17 04/02/2018 0809   ALT 12 (L) 06/06/2014 1102   BILITOT 0.5 04/02/2018 0809   BILITOT 0.4 06/06/2014 1102       RADIOGRAPHIC  STUDIES: I have personally reviewed the radiological images as listed and agreed with the findings in the report. No results found.   ASSESSMENT & PLAN:  Carcinoma of upper-inner quadrant of left breast in female, estrogen receptor positive ([place]) Left breast cancer- stage IV- ER/PR +, HER-2/neu - on Taxol chemotherapy;  July 2019/AUG 2019- PET- overall STABLE; RLL nodule- clinically rounded atelectasis. STABLE. TM-coming down.   # Continue Taxol 87m/m2; Labs today reviewed;  Acceptable for treatment today.   #Right lower lobe nodular mass pleural-based-likely atelectasis less likely progressive malignancy; no significant uptake noted on PET scan. STABLE. 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   # Chronic kidney disease - stage IV- creatinine 3.03; STABLE.   # ? Genetic predisposition- sister- 61 breast cancer- Bil; youngest sister- breast cyst; mom-ovarian cancer; dad's sister [80s]- pancreatic ca- s/p Genetic counseling [8/29]- pending.  # Type 2 DM- insulin dependent-STABLE.   # Anemia- hemoglobin today 10.2;STABLE; on PO iron.   # follow up in 2 weeks/labs/MD/Taxol. 1 week-taxol/cbc.    No orders of the defined types were placed in this encounter.  All questions were answered. The patient knows to call the clinic with any problems, questions or concerns.      Cammie Sickle, MD 04/02/2018 9:04 AM

## 2018-04-03 DIAGNOSIS — Z23 Encounter for immunization: Secondary | ICD-10-CM | POA: Diagnosis not present

## 2018-04-07 NOTE — Addendum Note (Signed)
Addended by: Tana Felts on: 04/07/2018 02:05 PM   Modules accepted: Orders

## 2018-04-09 ENCOUNTER — Inpatient Hospital Stay: Payer: Medicare Other

## 2018-04-09 ENCOUNTER — Inpatient Hospital Stay: Payer: Medicare Other | Attending: Internal Medicine

## 2018-04-09 VITALS — BP 138/86 | HR 72 | Temp 97.6°F | Resp 20

## 2018-04-09 DIAGNOSIS — Z8041 Family history of malignant neoplasm of ovary: Secondary | ICD-10-CM | POA: Diagnosis not present

## 2018-04-09 DIAGNOSIS — Z8042 Family history of malignant neoplasm of prostate: Secondary | ICD-10-CM | POA: Insufficient documentation

## 2018-04-09 DIAGNOSIS — Z170 Estrogen receptor positive status [ER+]: Secondary | ICD-10-CM | POA: Diagnosis not present

## 2018-04-09 DIAGNOSIS — Z800 Family history of malignant neoplasm of digestive organs: Secondary | ICD-10-CM

## 2018-04-09 DIAGNOSIS — R5381 Other malaise: Secondary | ICD-10-CM | POA: Diagnosis not present

## 2018-04-09 DIAGNOSIS — I509 Heart failure, unspecified: Secondary | ICD-10-CM | POA: Diagnosis not present

## 2018-04-09 DIAGNOSIS — C50919 Malignant neoplasm of unspecified site of unspecified female breast: Secondary | ICD-10-CM

## 2018-04-09 DIAGNOSIS — D649 Anemia, unspecified: Secondary | ICD-10-CM | POA: Insufficient documentation

## 2018-04-09 DIAGNOSIS — Z808 Family history of malignant neoplasm of other organs or systems: Secondary | ICD-10-CM | POA: Diagnosis not present

## 2018-04-09 DIAGNOSIS — C50212 Malignant neoplasm of upper-inner quadrant of left female breast: Secondary | ICD-10-CM | POA: Diagnosis not present

## 2018-04-09 DIAGNOSIS — R5383 Other fatigue: Secondary | ICD-10-CM | POA: Diagnosis not present

## 2018-04-09 DIAGNOSIS — Z79899 Other long term (current) drug therapy: Secondary | ICD-10-CM | POA: Diagnosis not present

## 2018-04-09 DIAGNOSIS — Z806 Family history of leukemia: Secondary | ICD-10-CM | POA: Insufficient documentation

## 2018-04-09 DIAGNOSIS — R600 Localized edema: Secondary | ICD-10-CM | POA: Insufficient documentation

## 2018-04-09 DIAGNOSIS — Z794 Long term (current) use of insulin: Secondary | ICD-10-CM | POA: Diagnosis not present

## 2018-04-09 DIAGNOSIS — Z87891 Personal history of nicotine dependence: Secondary | ICD-10-CM | POA: Diagnosis not present

## 2018-04-09 DIAGNOSIS — C7951 Secondary malignant neoplasm of bone: Secondary | ICD-10-CM | POA: Diagnosis not present

## 2018-04-09 DIAGNOSIS — Z8673 Personal history of transient ischemic attack (TIA), and cerebral infarction without residual deficits: Secondary | ICD-10-CM | POA: Diagnosis not present

## 2018-04-09 DIAGNOSIS — N184 Chronic kidney disease, stage 4 (severe): Secondary | ICD-10-CM | POA: Insufficient documentation

## 2018-04-09 DIAGNOSIS — Z803 Family history of malignant neoplasm of breast: Secondary | ICD-10-CM | POA: Diagnosis not present

## 2018-04-09 DIAGNOSIS — Z7982 Long term (current) use of aspirin: Secondary | ICD-10-CM | POA: Insufficient documentation

## 2018-04-09 DIAGNOSIS — Z5111 Encounter for antineoplastic chemotherapy: Secondary | ICD-10-CM | POA: Diagnosis not present

## 2018-04-09 DIAGNOSIS — C7800 Secondary malignant neoplasm of unspecified lung: Secondary | ICD-10-CM | POA: Diagnosis not present

## 2018-04-09 DIAGNOSIS — E1122 Type 2 diabetes mellitus with diabetic chronic kidney disease: Secondary | ICD-10-CM | POA: Insufficient documentation

## 2018-04-09 LAB — CBC WITH DIFFERENTIAL/PLATELET
BASOS ABS: 0 10*3/uL (ref 0–0.1)
BASOS PCT: 1 %
Eosinophils Absolute: 0.1 10*3/uL (ref 0–0.7)
Eosinophils Relative: 1 %
HCT: 28.6 % — ABNORMAL LOW (ref 35.0–47.0)
Hemoglobin: 9.7 g/dL — ABNORMAL LOW (ref 12.0–16.0)
Lymphocytes Relative: 17 %
Lymphs Abs: 1 10*3/uL (ref 1.0–3.6)
MCH: 31.2 pg (ref 26.0–34.0)
MCHC: 34 g/dL (ref 32.0–36.0)
MCV: 92 fL (ref 80.0–100.0)
MONO ABS: 0.5 10*3/uL (ref 0.2–0.9)
MONOS PCT: 8 %
NEUTROS PCT: 73 %
Neutro Abs: 4.4 10*3/uL (ref 1.4–6.5)
Platelets: 271 10*3/uL (ref 150–440)
RBC: 3.11 MIL/uL — ABNORMAL LOW (ref 3.80–5.20)
RDW: 14.5 % (ref 11.5–14.5)
WBC: 6 10*3/uL (ref 3.6–11.0)

## 2018-04-09 LAB — COMPREHENSIVE METABOLIC PANEL
ALT: 21 U/L (ref 0–44)
ANION GAP: 10 (ref 5–15)
AST: 18 U/L (ref 15–41)
Albumin: 3.6 g/dL (ref 3.5–5.0)
Alkaline Phosphatase: 75 U/L (ref 38–126)
BILIRUBIN TOTAL: 0.5 mg/dL (ref 0.3–1.2)
BUN: 44 mg/dL — ABNORMAL HIGH (ref 8–23)
CALCIUM: 8.2 mg/dL — AB (ref 8.9–10.3)
CO2: 23 mmol/L (ref 22–32)
Chloride: 105 mmol/L (ref 98–111)
Creatinine, Ser: 2.8 mg/dL — ABNORMAL HIGH (ref 0.44–1.00)
GFR, EST AFRICAN AMERICAN: 20 mL/min — AB (ref >60)
GFR, EST NON AFRICAN AMERICAN: 17 mL/min — AB (ref >60)
GLUCOSE: 116 mg/dL — AB (ref 70–99)
Potassium: 3.8 mmol/L (ref 3.5–5.1)
Sodium: 138 mmol/L (ref 135–145)
TOTAL PROTEIN: 6.8 g/dL (ref 6.5–8.1)

## 2018-04-09 MED ORDER — FAMOTIDINE IN NACL 20-0.9 MG/50ML-% IV SOLN
20.0000 mg | Freq: Once | INTRAVENOUS | Status: AC
  Administered 2018-04-09: 20 mg via INTRAVENOUS
  Filled 2018-04-09: qty 50

## 2018-04-09 MED ORDER — DIPHENHYDRAMINE HCL 50 MG/ML IJ SOLN
50.0000 mg | Freq: Once | INTRAMUSCULAR | Status: AC
  Administered 2018-04-09: 50 mg via INTRAVENOUS
  Filled 2018-04-09: qty 1

## 2018-04-09 MED ORDER — HEPARIN SOD (PORK) LOCK FLUSH 100 UNIT/ML IV SOLN
500.0000 [IU] | Freq: Once | INTRAVENOUS | Status: AC
  Administered 2018-04-09: 500 [IU] via INTRAVENOUS
  Filled 2018-04-09: qty 5

## 2018-04-09 MED ORDER — DEXAMETHASONE SODIUM PHOSPHATE 10 MG/ML IJ SOLN
8.0000 mg | Freq: Once | INTRAMUSCULAR | Status: AC
  Administered 2018-04-09: 8 mg via INTRAVENOUS
  Filled 2018-04-09: qty 1

## 2018-04-09 MED ORDER — SODIUM CHLORIDE 0.9 % IV SOLN
60.0000 mg/m2 | Freq: Once | INTRAVENOUS | Status: AC
  Administered 2018-04-09: 114 mg via INTRAVENOUS
  Filled 2018-04-09: qty 19

## 2018-04-09 MED ORDER — SODIUM CHLORIDE 0.9 % IV SOLN
Freq: Once | INTRAVENOUS | Status: AC
  Administered 2018-04-09: 09:00:00 via INTRAVENOUS
  Filled 2018-04-09: qty 250

## 2018-04-09 MED ORDER — SODIUM CHLORIDE 0.9% FLUSH
10.0000 mL | Freq: Once | INTRAVENOUS | Status: AC
  Administered 2018-04-09: 10 mL via INTRAVENOUS
  Filled 2018-04-09: qty 10

## 2018-04-14 DIAGNOSIS — N2581 Secondary hyperparathyroidism of renal origin: Secondary | ICD-10-CM | POA: Diagnosis not present

## 2018-04-14 DIAGNOSIS — R809 Proteinuria, unspecified: Secondary | ICD-10-CM | POA: Diagnosis not present

## 2018-04-14 DIAGNOSIS — N185 Chronic kidney disease, stage 5: Secondary | ICD-10-CM | POA: Diagnosis not present

## 2018-04-14 DIAGNOSIS — D631 Anemia in chronic kidney disease: Secondary | ICD-10-CM | POA: Diagnosis not present

## 2018-04-14 DIAGNOSIS — E1122 Type 2 diabetes mellitus with diabetic chronic kidney disease: Secondary | ICD-10-CM | POA: Diagnosis not present

## 2018-04-16 ENCOUNTER — Other Ambulatory Visit: Payer: Self-pay

## 2018-04-16 ENCOUNTER — Inpatient Hospital Stay: Payer: Medicare Other

## 2018-04-16 ENCOUNTER — Inpatient Hospital Stay (HOSPITAL_BASED_OUTPATIENT_CLINIC_OR_DEPARTMENT_OTHER): Payer: Medicare Other | Admitting: Internal Medicine

## 2018-04-16 VITALS — BP 160/90 | HR 78 | Temp 97.2°F | Resp 18 | Wt 205.9 lb

## 2018-04-16 DIAGNOSIS — Z170 Estrogen receptor positive status [ER+]: Principal | ICD-10-CM

## 2018-04-16 DIAGNOSIS — I509 Heart failure, unspecified: Secondary | ICD-10-CM | POA: Diagnosis not present

## 2018-04-16 DIAGNOSIS — R600 Localized edema: Secondary | ICD-10-CM

## 2018-04-16 DIAGNOSIS — C7951 Secondary malignant neoplasm of bone: Secondary | ICD-10-CM | POA: Diagnosis not present

## 2018-04-16 DIAGNOSIS — R5381 Other malaise: Secondary | ICD-10-CM

## 2018-04-16 DIAGNOSIS — Z79899 Other long term (current) drug therapy: Secondary | ICD-10-CM

## 2018-04-16 DIAGNOSIS — Z794 Long term (current) use of insulin: Secondary | ICD-10-CM | POA: Diagnosis not present

## 2018-04-16 DIAGNOSIS — C50212 Malignant neoplasm of upper-inner quadrant of left female breast: Secondary | ICD-10-CM | POA: Diagnosis not present

## 2018-04-16 DIAGNOSIS — Z803 Family history of malignant neoplasm of breast: Secondary | ICD-10-CM

## 2018-04-16 DIAGNOSIS — Z7951 Long term (current) use of inhaled steroids: Secondary | ICD-10-CM

## 2018-04-16 DIAGNOSIS — N184 Chronic kidney disease, stage 4 (severe): Secondary | ICD-10-CM | POA: Diagnosis not present

## 2018-04-16 DIAGNOSIS — Z8673 Personal history of transient ischemic attack (TIA), and cerebral infarction without residual deficits: Secondary | ICD-10-CM

## 2018-04-16 DIAGNOSIS — R5383 Other fatigue: Secondary | ICD-10-CM | POA: Diagnosis not present

## 2018-04-16 DIAGNOSIS — D649 Anemia, unspecified: Secondary | ICD-10-CM | POA: Diagnosis not present

## 2018-04-16 DIAGNOSIS — Z5111 Encounter for antineoplastic chemotherapy: Secondary | ICD-10-CM | POA: Diagnosis not present

## 2018-04-16 DIAGNOSIS — C7800 Secondary malignant neoplasm of unspecified lung: Secondary | ICD-10-CM | POA: Diagnosis not present

## 2018-04-16 DIAGNOSIS — E1122 Type 2 diabetes mellitus with diabetic chronic kidney disease: Secondary | ICD-10-CM | POA: Diagnosis not present

## 2018-04-16 DIAGNOSIS — Z87891 Personal history of nicotine dependence: Secondary | ICD-10-CM

## 2018-04-16 DIAGNOSIS — Z7982 Long term (current) use of aspirin: Secondary | ICD-10-CM

## 2018-04-16 LAB — CBC WITH DIFFERENTIAL/PLATELET
BASOS ABS: 0.1 10*3/uL (ref 0–0.1)
Basophils Relative: 1 %
EOS ABS: 0.1 10*3/uL (ref 0–0.7)
Eosinophils Relative: 1 %
HCT: 29.4 % — ABNORMAL LOW (ref 35.0–47.0)
Hemoglobin: 10 g/dL — ABNORMAL LOW (ref 12.0–16.0)
LYMPHS ABS: 1.3 10*3/uL (ref 1.0–3.6)
LYMPHS PCT: 18 %
MCH: 31.6 pg (ref 26.0–34.0)
MCHC: 34 g/dL (ref 32.0–36.0)
MCV: 92.7 fL (ref 80.0–100.0)
MONO ABS: 0.6 10*3/uL (ref 0.2–0.9)
Monocytes Relative: 8 %
Neutro Abs: 5.5 10*3/uL (ref 1.4–6.5)
Neutrophils Relative %: 72 %
PLATELETS: 327 10*3/uL (ref 150–440)
RBC: 3.17 MIL/uL — ABNORMAL LOW (ref 3.80–5.20)
RDW: 15 % — ABNORMAL HIGH (ref 11.5–14.5)
WBC: 7.6 10*3/uL (ref 3.6–11.0)

## 2018-04-16 LAB — COMPREHENSIVE METABOLIC PANEL
ALK PHOS: 68 U/L (ref 38–126)
ALT: 15 U/L (ref 0–44)
ANION GAP: 8 (ref 5–15)
AST: 17 U/L (ref 15–41)
Albumin: 3.9 g/dL (ref 3.5–5.0)
BILIRUBIN TOTAL: 0.7 mg/dL (ref 0.3–1.2)
BUN: 39 mg/dL — ABNORMAL HIGH (ref 8–23)
CALCIUM: 9 mg/dL (ref 8.9–10.3)
CO2: 24 mmol/L (ref 22–32)
Chloride: 106 mmol/L (ref 98–111)
Creatinine, Ser: 2.65 mg/dL — ABNORMAL HIGH (ref 0.44–1.00)
GFR calc non Af Amer: 18 mL/min — ABNORMAL LOW (ref >60)
GFR, EST AFRICAN AMERICAN: 21 mL/min — AB (ref >60)
GLUCOSE: 73 mg/dL (ref 70–99)
Potassium: 4.5 mmol/L (ref 3.5–5.1)
Sodium: 138 mmol/L (ref 135–145)
Total Protein: 7 g/dL (ref 6.5–8.1)

## 2018-04-16 MED ORDER — SODIUM CHLORIDE 0.9 % IV SOLN
Freq: Once | INTRAVENOUS | Status: AC
  Administered 2018-04-16: 09:00:00 via INTRAVENOUS
  Filled 2018-04-16: qty 250

## 2018-04-16 MED ORDER — SODIUM CHLORIDE 0.9% FLUSH
10.0000 mL | INTRAVENOUS | Status: DC | PRN
  Administered 2018-04-16: 10 mL via INTRAVENOUS
  Filled 2018-04-16: qty 10

## 2018-04-16 MED ORDER — FAMOTIDINE IN NACL 20-0.9 MG/50ML-% IV SOLN
20.0000 mg | Freq: Once | INTRAVENOUS | Status: AC
  Administered 2018-04-16: 20 mg via INTRAVENOUS
  Filled 2018-04-16: qty 50

## 2018-04-16 MED ORDER — DEXAMETHASONE SODIUM PHOSPHATE 10 MG/ML IJ SOLN
8.0000 mg | Freq: Once | INTRAMUSCULAR | Status: AC
  Administered 2018-04-16: 8 mg via INTRAVENOUS
  Filled 2018-04-16: qty 1

## 2018-04-16 MED ORDER — DIPHENHYDRAMINE HCL 50 MG/ML IJ SOLN
50.0000 mg | Freq: Once | INTRAMUSCULAR | Status: AC
  Administered 2018-04-16: 50 mg via INTRAVENOUS
  Filled 2018-04-16: qty 1

## 2018-04-16 MED ORDER — HEPARIN SOD (PORK) LOCK FLUSH 100 UNIT/ML IV SOLN
500.0000 [IU] | Freq: Once | INTRAVENOUS | Status: AC
  Administered 2018-04-16: 500 [IU] via INTRAVENOUS
  Filled 2018-04-16: qty 5

## 2018-04-16 MED ORDER — SODIUM CHLORIDE 0.9 % IV SOLN
60.0000 mg/m2 | Freq: Once | INTRAVENOUS | Status: AC
  Administered 2018-04-16: 114 mg via INTRAVENOUS
  Filled 2018-04-16: qty 19

## 2018-04-16 NOTE — Assessment & Plan Note (Addendum)
Left breast cancer- stage IV- ER/PR +, HER-2/neu - on Taxol chemotherapy;  July 2019/AUG 2019- PET- overall STABLE; RLL nodule- clinically rounded atelectasis. STABLE.   # Continue Taxol 26m/m2; Labs today reviewed;  Acceptable for treatment today.   #Right lower lobe nodular mass pleural-based-likely atelectasis less likely progressive malignancy; no significant uptake noted on PET scan. STABLE. 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   # Bil LE swelling/ededma MILD- discussed re: compression stocking/ leg elevation; if not improve then- increase bumex.   # Chronic kidney disease - stage IV- creatinine 2.65- STABLE.   # ? Genetic predisposition- s/p Genetic counseling [8/29]- results pending.   # Type 2 DM- insulin dependent-STABLE.   # Anemia- hemoglobin today-10; STABLE.   # follow up in 2 weeks/labs/MD/Taxol. 1 week-taxol/cbc.

## 2018-04-16 NOTE — Progress Notes (Signed)
[facility] OFFICE PROGRESS NOTE  Patient Care Team: [name], [name], [name]; [name]; [name]; [name]; EBUS- Positive Paratrac/subcarinal LN s/p ? Taxotere [in [place]; [name]; [name];[name]; [name]; suv 3]; [name]; [name]; [name];  [name]; Ant R iliac bone; [name]; [name]; START-Taxol weekly.   # Poorly controlled Blood sugars- improved.   # Pancreatitis Hx/ CKD IV [creat ~ 3-4; [name]; [name]; mild left sided weakness]  # MOLECULAR TESTING: NA [] ------------------------------------------------   DIAGNOSIS: [ 8250] BREAST CA; [name]; [name]    ;[name], estrogen receptor positive ([place])      INTERVAL HISTORY:  [name] 62 y.o.  female pleasant patient above history of ER PR positive HER-2 negative breast cancer on Taxol is here for follow-up.  Patient complains of mild fatigue.  Otherwise no worsening shortness of breath or cough.  She complains of mild tingling and numbness in the extremities.  Not any worse.  Planes of mild swelling in the legs.  No cough fevers or  chills.  Review of Systems  Constitutional: Positive for malaise/fatigue. Negative for chills, diaphoresis, [name], hemoptysis, sputum production, shortness of breath and wheezing.   Cardiovascular: Positive for leg swelling. Negative for chest pain, palpitations and orthopnea.  Gastrointestinal: Negative for abdominal pain, blood in stool, constipation, diarrhea, heartburn, melena, nausea and vomiting.  Genitourinary: Negative for dysuria, frequency and urgency.  Musculoskeletal: Negative for back pain and joint pain.  Skin: Negative.  Negative for itching and rash.  Neurological: Positive for tingling. Negative for dizziness, focal weakness, weakness and headaches.  Endo/Heme/Allergies: Does not bruise/bleed easily.  Psychiatric/Behavioral: Negative for depression. The patient is not nervous/anxious and does not have insomnia.       PAST MEDICAL HISTORY :  Past Medical History:  Diagnosis Date  . Asthma   . Cancer ([place]) 03/10/2018   Per [facility] PET order. Carcinoma of upper-inner quadrant of left breast in female, estrogen receptor positive .  Marland Kitchen CHF (congestive heart failure) ([place]) 1997  . CKD (chronic kidney disease)   . Depression   . Diabetes mellitus, type 2 ([place])   . Family history of breast cancer   . Family history of colon cancer   . Family history of ovarian cancer   . Family history of pancreatic cancer   . Family history of prostate cancer   . Family history of stomach cancer   . Hair loss   . History of left breast cancer 05/29/14  . History of partial hysterectomy 12/31/2016   Per  patient.  Has not had a period in years.  Had a partial hysterectomy years ago.  . Obesity   . Pancreatitis 1997  . Stroke [facility]) 2010   with mild left arm weakness    PAST SURGICAL HISTORY :   Past Surgical History:  Procedure Laterality Date  .  CESAREAN SECTION    . CHOLECYSTECTOMY    . PARTIAL HYSTERECTOMY  12/31/2016   Per patient, she has not had a period in years since she had a partial hysterectomy.    FAMILY HISTORY :   Family History  Problem Relation Age of Onset  . Ovarian cancer Mother 38  . Diabetes Mother   . Hypertension Mother   . COPD Father   . Hypertension Father   . Colon cancer Father 41  . Diabetes Sister   . Breast cancer Sister 27       bilateral  . Diabetes Brother   . Leukemia Maternal Aunt   . Pancreatic cancer Paternal Aunt 23  . Pancreatic cancer Paternal Uncle   . Colon cancer Paternal Uncle   . Stomach cancer Maternal Grandfather 81  . Throat cancer Paternal Grandmother   . Breast cancer Maternal Aunt 80  . Colon cancer Maternal Aunt   . Bone cancer Maternal Aunt   . Breast cancer Paternal Aunt        dx >50  . Prostate cancer Paternal Uncle   . Pancreatic cancer Paternal Uncle   . Throat cancer Paternal Uncle   . Lung cancer Paternal Uncle   . Stomach cancer Paternal Uncle   . Brain cancer Paternal Aunt   . Cancer Cousin        liver, kidney  . Prostate cancer Cousin        meastatic  . Lung cancer Other     SOCIAL HISTORY:   Social History   Tobacco Use  . Smoking status: Former Smoker    Packs/day: 0.50    Years: 1.00    Pack years: 0.50    Types: Cigarettes  . Smokeless tobacco: Never Used  Substance Use Topics  . Alcohol use: No    Alcohol/week: 0.0 standard drinks  . Drug use: No    ALLERGIES:  has No Known Allergies.  MEDICATIONS:  Current Outpatient Medications  Medication Sig Dispense Refill  . albuterol (PROVENTIL) (2.5 MG/3ML) 0.083% nebulizer solution Inhale 2.5 mg into the lungs every 6 (six) hours as needed for shortness of breath.     Marland Kitchen amLODipine (NORVASC) 10 MG tablet Take 10 mg by mouth daily.     Marland Kitchen aspirin EC 81 MG tablet Take 81 mg by mouth once.     Marland Kitchen atenolol (TENORMIN) 100 MG tablet TAKE 1 TABLETS BY MOUTH TWICE DAILY    . B-D ULTRA-FINE  33 LANCETS MISC Use 1 each 2 (two) times daily.    . B-D ULTRAFINE III SHORT PEN 31G X 8 MM MISC     . bumetanide (BUMEX) 0.5 MG tablet TAKE 1 TABLET BY MOUTH TWICE DAILY    . calcitRIOL (ROCALTROL) 0.25 MCG capsule Take 0.25 mcg by mouth 3 (three) times a week.    . Cinnamon 500 MG capsule Take 500 mg by mouth daily.     . cloNIDine (CATAPRES) 0.2 MG tablet Take 0.2 mg by mouth 2 (two) times daily.     . enalapril (VASOTEC) 20 MG tablet Take 20 mg by mouth 2 (two) times daily.     . ferrous sulfate 325 (65 FE)  MG tablet Take 325 mg by mouth 2 (two) times daily with a meal.    . FLUoxetine (PROZAC) 20 MG capsule Take 20 mg by mouth 2 (two) times daily.     Marland Kitchen gabapentin (NEURONTIN) 100 MG capsule Take 1 capsule (100 mg total) by mouth at bedtime. 30 capsule 2  . glucose blood (ONE TOUCH ULTRA TEST) test strip Use 1 each 2 (two) times daily. Use as instructed.    . glyBURIDE (DIABETA) 5 MG tablet Take 5 mg by mouth daily with breakfast.     . LEVEMIR FLEXTOUCH 100 UNIT/ML Pen Inject 55 Units into the skin daily.     . metoprolol tartrate (LOPRESSOR) 25 MG tablet TAKE 1 TABLET BY MOUTH TWICE DAILY 60 tablet 0  . mometasone (NASONEX) 50 MCG/ACT nasal spray Place 2 sprays into the nose daily as needed.     Marland Kitchen NOVOLOG FLEXPEN 100 UNIT/ML FlexPen Inject 7 Units into the skin 2 (two) times daily at 8 am and 10 pm.     . salmeterol (SEREVENT) 50 MCG/DOSE diskus inhaler Inhale 1 puff into the lungs 2 (two) times daily.    . simvastatin (ZOCOR) 20 MG tablet Take 20 mg by mouth daily at 6 PM.     . vitamin B-12 (CYANOCOBALAMIN) 1000 MCG tablet Take 1,000 mcg by mouth daily.    Marland Kitchen acetaminophen-codeine (TYLENOL #4) 300-60 MG tablet Take 2 tablets by mouth every 6 (six) hours as needed for moderate pain.     Marland Kitchen albuterol (PROAIR HFA) 108 (90 BASE) MCG/ACT inhaler Inhale 1 puff into the lungs every 6 (six) hours as needed for shortness of breath.     . ALPRAZolam (XANAX) 0.5 MG tablet Take 0.5 mg by mouth at  bedtime as needed for anxiety or sleep.     Marland Kitchen ondansetron (ZOFRAN) 8 MG tablet One pill every 8 hours as needed for nausea/vomitting. (Patient not taking: Reported on 04/16/2018) 40 tablet 1  . prochlorperazine (COMPAZINE) 10 MG tablet Take 1 tablet (10 mg total) by mouth every 6 (six) hours as needed for nausea or vomiting. Please note change in strength (Patient not taking: Reported on 04/16/2018) 60 tablet 4   No current facility-administered medications for this visit.    Facility-Administered Medications Ordered in Other Visits  Medication Dose Route Frequency Provider Last Rate Last Dose  . 0.9 %  sodium chloride infusion   Intravenous Once Charlaine Dalton R, MD      . dexamethasone (DECADRON) injection 8 mg  8 mg Intravenous Once Charlaine Dalton R, MD      . diphenhydrAMINE (BENADRYL) injection 50 mg  50 mg Intravenous Once Charlaine Dalton R, MD      . famotidine (PEPCID) IVPB 20 mg premix  20 mg Intravenous Once Charlaine Dalton R, MD      . heparin lock flush 100 unit/mL  500 Units Intravenous Once Charlaine Dalton R, MD      . PACLitaxel (TAXOL) 114 mg in sodium chloride 0.9 % 250 mL chemo infusion (</= 37m/m2)  60 mg/m2 (Treatment Plan Recorded) Intravenous Once BCharlaine DaltonR, MD      . sodium chloride flush (NS) 0.9 % injection 10 mL  10 mL Intravenous PRN BCammie Sickle MD   10 mL at 01/30/16 1054  . sodium chloride flush (NS) 0.9 % injection 10 mL  10 mL Intravenous PRN BCammie Sickle MD   10 mL at 04/16/18 0824    PHYSICAL EXAMINATION: ECOG PERFORMANCE STATUS: 1 - Symptomatic  but completely ambulatory  BP (!) 160/90 (BP Location: Left Arm, Patient Position: Sitting)   Pulse 78   Temp (!) 97.2 F (36.2 C) (Tympanic)   Resp 18   Wt 205 lb 14.4 oz (93.4 kg)   BMI 37.66 kg/m   Filed Weights   04/16/18 0839  Weight: 205 lb 14.4 oz (93.4 kg)    Physical Exam  Constitutional: She is oriented to person, place, and time and  well-developed, well-nourished, and in no distress.  She is alone.  HENT:  Head: Normocephalic and atraumatic.  Mouth/Throat: Oropharynx is clear and moist. No oropharyngeal exudate.  Eyes: Pupils are equal, round, and reactive to light.  Neck: Normal range of motion. Neck supple.  Cardiovascular: Normal rate and regular rhythm.  Pulmonary/Chest: No respiratory distress. She has no wheezes.  Abdominal: Soft. Bowel sounds are normal. She exhibits no distension and no mass. There is no tenderness. There is no rebound and no guarding.  Musculoskeletal: Normal range of motion. She exhibits edema. She exhibits no tenderness.  Mild lower extremity swelling bilaterally.  Neurological: She is alert and oriented to person, place, and time.  Skin: Skin is warm.  Psychiatric: Affect normal.       LABORATORY DATA:  I have reviewed the data as listed    Component Value Date/Time   NA 138 04/16/2018 0829   NA 130 (L) 06/06/2014 1102   K 4.5 04/16/2018 0829   K 3.9 06/06/2014 1102   CL 106 04/16/2018 0829   CL 95 (L) 06/06/2014 1102   CO2 24 04/16/2018 0829   CO2 28 06/06/2014 1102   GLUCOSE 73 04/16/2018 0829   GLUCOSE 349 (H) 06/06/2014 1102   BUN 39 (H) 04/16/2018 0829   BUN 17 06/06/2014 1102   CREATININE 2.65 (H) 04/16/2018 0829   CREATININE 1.63 (H) 06/06/2014 1102   CALCIUM 9.0 04/16/2018 0829   CALCIUM 9.2 06/06/2014 1102   PROT 7.0 04/16/2018 0829   PROT 8.2 06/06/2014 1102   ALBUMIN 3.9 04/16/2018 0829   ALBUMIN 3.3 (L) 06/06/2014 1102   AST 17 04/16/2018 0829   AST 7 (L) 06/06/2014 1102   ALT 15 04/16/2018 0829   ALT 12 (L) 06/06/2014 1102   ALKPHOS 68 04/16/2018 0829   ALKPHOS 74 06/06/2014 1102   BILITOT 0.7 04/16/2018 0829   BILITOT 0.4 06/06/2014 1102   GFRNONAA 18 (L) 04/16/2018 0829   GFRNONAA 35 (L) 06/06/2014 1102   GFRAA 21 (L) 04/16/2018 0829   GFRAA 42 (L) 06/06/2014 1102    No results found for: SPEP, UPEP  Lab Results  Component Value Date   WBC  7.6 04/16/2018   NEUTROABS 5.5 04/16/2018   HGB 10.0 (L) 04/16/2018   HCT 29.4 (L) 04/16/2018   MCV 92.7 04/16/2018   PLT 327 04/16/2018      Chemistry      Component Value Date/Time   NA 138 04/16/2018 0829   NA 130 (L) 06/06/2014 1102   K 4.5 04/16/2018 0829   K 3.9 06/06/2014 1102   CL 106 04/16/2018 0829   CL 95 (L) 06/06/2014 1102   CO2 24 04/16/2018 0829   CO2 28 06/06/2014 1102   BUN 39 (H) 04/16/2018 0829   BUN 17 06/06/2014 1102   CREATININE 2.65 (H) 04/16/2018 0829   CREATININE 1.63 (H) 06/06/2014 1102      Component Value Date/Time   CALCIUM 9.0 04/16/2018 0829   CALCIUM 9.2 06/06/2014 1102   ALKPHOS 68 04/16/2018 0829   ALKPHOS  74 06/06/2014 1102   AST 17 04/16/2018 0829   AST 7 (L) 06/06/2014 1102   ALT 15 04/16/2018 0829   ALT 12 (L) 06/06/2014 1102   BILITOT 0.7 04/16/2018 0829   BILITOT 0.4 06/06/2014 1102       RADIOGRAPHIC STUDIES: I have personally reviewed the radiological images as listed and agreed with the findings in the report. No results found.   ASSESSMENT & PLAN:  Carcinoma of upper-inner quadrant of left breast in female, estrogen receptor positive ([place]) Left breast cancer- stage IV- ER/PR +, HER-2/neu - on Taxol chemotherapy;  July 2019/AUG 2019- PET- overall STABLE; RLL nodule- clinically rounded atelectasis. STABLE.   # Continue Taxol 89m/m2; Labs today reviewed;  Acceptable for treatment today.   #Right lower lobe nodular mass pleural-based-likely atelectasis less likely progressive malignancy; no significant uptake noted on PET scan. STABLE. 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   # Bil LE swelling/ededma MILD- discussed re: compression stocking/ leg elevation; if not improve then- increase bumex.   # Chronic kidney disease - stage IV- creatinine 2.65- STABLE.   # ? Genetic predisposition- s/p Genetic counseling [8/29]- results  pending.   # Type 2 DM- insulin dependent-STABLE.   # Anemia- hemoglobin today-10; STABLE.   # follow up in 2 weeks/labs/MD/Taxol. 1 week-taxol/cbc.    No orders of the defined types were placed in this encounter.  All questions were answered. The patient knows to call the clinic with any problems, questions or concerns.      GCammie Sickle MD 04/16/2018 9:19 AM

## 2018-04-16 NOTE — Progress Notes (Signed)
Here for follow up " overall Im doing just fine "  Stated " tingling in hands " continues- but improved she stated.

## 2018-04-30 ENCOUNTER — Other Ambulatory Visit: Payer: Self-pay

## 2018-04-30 ENCOUNTER — Inpatient Hospital Stay: Payer: Medicare Other

## 2018-04-30 ENCOUNTER — Encounter: Payer: Self-pay | Admitting: Internal Medicine

## 2018-04-30 ENCOUNTER — Inpatient Hospital Stay (HOSPITAL_BASED_OUTPATIENT_CLINIC_OR_DEPARTMENT_OTHER): Payer: Medicare Other | Admitting: Internal Medicine

## 2018-04-30 VITALS — BP 134/85 | HR 70 | Temp 97.9°F | Resp 20 | Ht 62.0 in | Wt 206.0 lb

## 2018-04-30 DIAGNOSIS — Z7982 Long term (current) use of aspirin: Secondary | ICD-10-CM

## 2018-04-30 DIAGNOSIS — R5383 Other fatigue: Secondary | ICD-10-CM

## 2018-04-30 DIAGNOSIS — R5381 Other malaise: Secondary | ICD-10-CM

## 2018-04-30 DIAGNOSIS — N184 Chronic kidney disease, stage 4 (severe): Secondary | ICD-10-CM | POA: Diagnosis not present

## 2018-04-30 DIAGNOSIS — Z794 Long term (current) use of insulin: Secondary | ICD-10-CM | POA: Diagnosis not present

## 2018-04-30 DIAGNOSIS — E1122 Type 2 diabetes mellitus with diabetic chronic kidney disease: Secondary | ICD-10-CM

## 2018-04-30 DIAGNOSIS — Z170 Estrogen receptor positive status [ER+]: Secondary | ICD-10-CM

## 2018-04-30 DIAGNOSIS — D649 Anemia, unspecified: Secondary | ICD-10-CM

## 2018-04-30 DIAGNOSIS — Z79899 Other long term (current) drug therapy: Secondary | ICD-10-CM

## 2018-04-30 DIAGNOSIS — Z87891 Personal history of nicotine dependence: Secondary | ICD-10-CM

## 2018-04-30 DIAGNOSIS — C7800 Secondary malignant neoplasm of unspecified lung: Secondary | ICD-10-CM | POA: Diagnosis not present

## 2018-04-30 DIAGNOSIS — C50212 Malignant neoplasm of upper-inner quadrant of left female breast: Secondary | ICD-10-CM | POA: Diagnosis not present

## 2018-04-30 DIAGNOSIS — Z5111 Encounter for antineoplastic chemotherapy: Secondary | ICD-10-CM | POA: Diagnosis not present

## 2018-04-30 DIAGNOSIS — Z803 Family history of malignant neoplasm of breast: Secondary | ICD-10-CM

## 2018-04-30 DIAGNOSIS — I509 Heart failure, unspecified: Secondary | ICD-10-CM | POA: Diagnosis not present

## 2018-04-30 DIAGNOSIS — C7951 Secondary malignant neoplasm of bone: Secondary | ICD-10-CM

## 2018-04-30 DIAGNOSIS — Z8673 Personal history of transient ischemic attack (TIA), and cerebral infarction without residual deficits: Secondary | ICD-10-CM

## 2018-04-30 DIAGNOSIS — R600 Localized edema: Secondary | ICD-10-CM | POA: Diagnosis not present

## 2018-04-30 LAB — COMPREHENSIVE METABOLIC PANEL
ALT: 15 U/L (ref 0–44)
ANION GAP: 9 (ref 5–15)
AST: 17 U/L (ref 15–41)
Albumin: 3.7 g/dL (ref 3.5–5.0)
Alkaline Phosphatase: 77 U/L (ref 38–126)
BILIRUBIN TOTAL: 0.5 mg/dL (ref 0.3–1.2)
BUN: 39 mg/dL — ABNORMAL HIGH (ref 8–23)
CO2: 25 mmol/L (ref 22–32)
Calcium: 8.8 mg/dL — ABNORMAL LOW (ref 8.9–10.3)
Chloride: 104 mmol/L (ref 98–111)
Creatinine, Ser: 3.46 mg/dL — ABNORMAL HIGH (ref 0.44–1.00)
GFR calc Af Amer: 15 mL/min — ABNORMAL LOW (ref >60)
GFR, EST NON AFRICAN AMERICAN: 13 mL/min — AB (ref >60)
Glucose, Bld: 88 mg/dL (ref 70–99)
POTASSIUM: 4.4 mmol/L (ref 3.5–5.1)
Sodium: 138 mmol/L (ref 135–145)
TOTAL PROTEIN: 7.3 g/dL (ref 6.5–8.1)

## 2018-04-30 LAB — CBC
HEMATOCRIT: 28.8 % — AB (ref 35.0–47.0)
HEMOGLOBIN: 9.9 g/dL — AB (ref 12.0–16.0)
MCH: 31.6 pg (ref 26.0–34.0)
MCHC: 34.4 g/dL (ref 32.0–36.0)
MCV: 91.9 fL (ref 80.0–100.0)
PLATELETS: 365 10*3/uL (ref 150–440)
RBC: 3.14 MIL/uL — ABNORMAL LOW (ref 3.80–5.20)
RDW: 14.4 % (ref 11.5–14.5)
WBC: 9.6 10*3/uL (ref 3.6–11.0)

## 2018-04-30 MED ORDER — SODIUM CHLORIDE 0.9 % IV SOLN
Freq: Once | INTRAVENOUS | Status: AC
  Administered 2018-04-30: 09:00:00 via INTRAVENOUS
  Filled 2018-04-30: qty 250

## 2018-04-30 MED ORDER — FAMOTIDINE IN NACL 20-0.9 MG/50ML-% IV SOLN
20.0000 mg | Freq: Once | INTRAVENOUS | Status: AC
  Administered 2018-04-30: 20 mg via INTRAVENOUS
  Filled 2018-04-30: qty 50

## 2018-04-30 MED ORDER — DIPHENHYDRAMINE HCL 50 MG/ML IJ SOLN
50.0000 mg | Freq: Once | INTRAMUSCULAR | Status: AC
  Administered 2018-04-30: 50 mg via INTRAVENOUS
  Filled 2018-04-30: qty 1

## 2018-04-30 MED ORDER — SODIUM CHLORIDE 0.9 % IV SOLN
60.0000 mg/m2 | Freq: Once | INTRAVENOUS | Status: AC
  Administered 2018-04-30: 114 mg via INTRAVENOUS
  Filled 2018-04-30: qty 19

## 2018-04-30 MED ORDER — HEPARIN SOD (PORK) LOCK FLUSH 100 UNIT/ML IV SOLN
500.0000 [IU] | Freq: Once | INTRAVENOUS | Status: AC | PRN
  Administered 2018-04-30: 500 [IU]
  Filled 2018-04-30: qty 5

## 2018-04-30 MED ORDER — DEXAMETHASONE SODIUM PHOSPHATE 10 MG/ML IJ SOLN
8.0000 mg | Freq: Once | INTRAMUSCULAR | Status: AC
  Administered 2018-04-30: 8 mg via INTRAVENOUS
  Filled 2018-04-30: qty 1

## 2018-04-30 NOTE — Assessment & Plan Note (Addendum)
Left breast cancer- stage IV- ER/PR +, HER-2/neu - on Taxol chemotherapy;  July 2019/AUG 2019- PET- overall STABLE; RLL nodule- clinically rounded atelectasis. STABLE.   # Continue Taxol 26m/m2; Labs today reviewed;  Acceptable for treatment today.   #Right lower lobe nodular mass pleural-based-likely atelectasis less likely progressive malignancy; no significant uptake noted on PET scan.  Stable.  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   # Bil LE swelling/ededma MILD- discussed re: compression stocking/ leg elevation; if not improve then- increase bumex.   # Chronic kidney disease - stage IV- creatinine 3.46; slightly worse;/increase in leg swelling-  discussed dietary compliance.   # ? Genetic predisposition- s/p Genetic counseling [8/29]- results pending.   # Type 2 DM- insulin dependent-STABLE  # Anemia- hemoglobin today-9-10; STABLE.   #  1 week-taxol/cbc. follow up in 2 weeks/labs/MD/Taxol.

## 2018-04-30 NOTE — Progress Notes (Signed)
[facility] OFFICE PROGRESS NOTE  Patient Care Team: [name], [name], [name]; [name]; [name]; [name]; EBUS- Positive Paratrac/subcarinal LN s/p ? Taxotere [in [place]; [name]; [name];[name]; [name]; suv 3]; [name]; [name]; [name];  [name]; Ant R iliac bone; [name]; [name]; START-Taxol weekly.   # Poorly controlled Blood sugars- improved.   # Pancreatitis Hx/ CKD IV [creat ~ 3-4; [name]; [name]; mild left sided weakness]  # MOLECULAR TESTING: NA []  ------------------------------------------------   DIAGNOSIS: [ 2015] BREAST CA; [name]; [name]    ;[name], estrogen receptor positive ([place])      INTERVAL HISTORY:  [name] 62 y.o.  female pleasant patient above history of ER PR positive HER-2 negative breast cancer on Taxol is here for follow-up.  Patient complains of swelling in the legs.  Denies any worsening shortness of breath or cough.  Complains of mild tingling and numbness in her hands.  None in the feet.  Not significantly worse.  No cough fevers or chills.  States that  she has been very busy with the family over the last few weeks.  Review of Systems  Constitutional: Positive for malaise/fatigue. Negative for chills, diaphoresis, [name], hemoptysis, sputum production, shortness of breath and wheezing.   Cardiovascular: Positive for leg swelling. Negative for chest pain, palpitations and orthopnea.  Gastrointestinal: Negative for abdominal pain, blood in stool, constipation, diarrhea, heartburn, melena, nausea and vomiting.  Genitourinary: Negative for dysuria, frequency and urgency.  Musculoskeletal: Negative for back pain and joint pain.  Skin: Negative.  Negative for itching and rash.  Neurological: Positive for tingling. Negative for dizziness, focal weakness, weakness and headaches.  Endo/Heme/Allergies: Does not bruise/bleed easily.  Psychiatric/Behavioral: Negative for depression. The patient is not nervous/anxious and does not have insomnia.       PAST MEDICAL HISTORY :  Past Medical History:  Diagnosis Date  . Asthma   . Cancer ([place]) 03/10/2018   Per [facility] PET order. Carcinoma of upper-inner quadrant of left breast in female, estrogen receptor positive .  Marland Kitchen CHF (congestive heart failure) ([place]) 1997  . CKD (chronic kidney disease)   . Depression   . Diabetes mellitus, type 2 ([place])   . Family history of breast cancer   . Family history of colon cancer   . Family history of ovarian cancer   . Family history of pancreatic cancer   . Family history of prostate cancer   . Family history of stomach cancer   . Hair loss   . History of  left breast cancer 05/29/14  . History of partial hysterectomy 12/31/2016   Per patient.  Has not had a period in years.  Had a partial hysterectomy years ago.  . Obesity   . Pancreatitis 1997  . Stroke [facility]) 2010   with mild left arm weakness    PAST SURGICAL HISTORY :   Past  Surgical History:  Procedure Laterality Date  . CESAREAN SECTION    . CHOLECYSTECTOMY    . PARTIAL HYSTERECTOMY  12/31/2016   Per patient, she has not had a period in years since she had a partial hysterectomy.    FAMILY HISTORY :   Family History  Problem Relation Age of Onset  . Ovarian cancer Mother 53  . Diabetes Mother   . Hypertension Mother   . COPD Father   . Hypertension Father   . Colon cancer Father 90  . Diabetes Sister   . Breast cancer Sister 75       bilateral  . Diabetes Brother   . Leukemia Maternal Aunt   . Pancreatic cancer Paternal Aunt 27  . Pancreatic cancer Paternal Uncle   . Colon cancer Paternal Uncle   . Stomach cancer Maternal Grandfather 67  . Throat cancer Paternal Grandmother   . Breast cancer Maternal Aunt 80  . Colon cancer Maternal Aunt   . Bone cancer Maternal Aunt   . Breast cancer Paternal Aunt        dx >50  . Prostate cancer Paternal Uncle   . Pancreatic cancer Paternal Uncle   . Throat cancer Paternal Uncle   . Lung cancer Paternal Uncle   . Stomach cancer Paternal Uncle   . Brain cancer Paternal Aunt   . Cancer Cousin        liver, kidney  . Prostate cancer Cousin        meastatic  . Lung cancer Other     SOCIAL HISTORY:   Social History   Tobacco Use  . Smoking status: Former Smoker    Packs/day: 0.50    Years: 1.00    Pack years: 0.50    Types: Cigarettes  . Smokeless tobacco: Never Used  Substance Use Topics  . Alcohol use: No    Alcohol/week: 0.0 standard drinks  . Drug use: No    ALLERGIES:  has No Known Allergies.  MEDICATIONS:  Current Outpatient Medications  Medication Sig Dispense Refill  . acetaminophen-codeine (TYLENOL #4) 300-60 MG tablet Take 2 tablets by mouth every 6 (six) hours as needed for moderate pain.     Marland Kitchen albuterol (PROAIR HFA) 108 (90 BASE) MCG/ACT inhaler Inhale 1 puff into the lungs every 6 (six) hours as needed for shortness of breath.     Marland Kitchen albuterol (PROVENTIL) (2.5 MG/3ML) 0.083%  nebulizer solution Inhale 2.5 mg into the lungs every 6 (six) hours as needed for shortness of breath.     . ALPRAZolam (XANAX) 0.5 MG tablet Take 0.5 mg by mouth at bedtime as needed for anxiety or sleep.     Marland Kitchen amLODipine (NORVASC) 10 MG tablet Take 10 mg by mouth daily.     Marland Kitchen aspirin EC 81 MG tablet Take 81 mg by mouth once.     Marland Kitchen atenolol (TENORMIN) 100 MG tablet TAKE 1 TABLETS BY MOUTH TWICE DAILY    . B-D ULTRA-FINE 33 LANCETS MISC Use 1 each 2 (two) times daily.    . B-D ULTRAFINE III SHORT PEN 31G X 8 MM MISC     . bumetanide (BUMEX)  0.5 MG tablet TAKE 1 TABLET BY MOUTH TWICE DAILY    . calcitRIOL (ROCALTROL) 0.25 MCG capsule Take 0.25 mcg by mouth 3 (three) times a week.    . Cinnamon 500 MG capsule Take 500 mg by mouth daily.     . cloNIDine (CATAPRES) 0.2 MG tablet Take 0.2 mg by mouth 2 (two) times daily.     . enalapril (VASOTEC) 20 MG tablet Take 20 mg by mouth 2 (two) times daily.     . ferrous sulfate 325 (65 FE) MG tablet Take 325 mg by mouth 2 (two) times daily with a meal.    . FLUoxetine (PROZAC) 20 MG capsule Take 20 mg by mouth 2 (two) times daily.     Marland Kitchen gabapentin (NEURONTIN) 100 MG capsule Take 1 capsule (100 mg total) by mouth at bedtime. 30 capsule 2  . glucose blood (ONE TOUCH ULTRA TEST) test strip Use 1 each 2 (two) times daily. Use as instructed.    . glyBURIDE (DIABETA) 5 MG tablet Take 5 mg by mouth daily with breakfast.     . LEVEMIR FLEXTOUCH 100 UNIT/ML Pen Inject 55 Units into the skin daily.     . metoprolol tartrate (LOPRESSOR) 25 MG tablet TAKE 1 TABLET BY MOUTH TWICE DAILY 60 tablet 0  . mometasone (NASONEX) 50 MCG/ACT nasal spray Place 2 sprays into the nose daily as needed.     Marland Kitchen NOVOLOG FLEXPEN 100 UNIT/ML FlexPen Inject 7 Units into the skin 2 (two) times daily at 8 am and 10 pm.     . salmeterol (SEREVENT) 50 MCG/DOSE diskus inhaler Inhale 1 puff into the lungs 2 (two) times daily.    . simvastatin (ZOCOR) 20 MG tablet Take 20 mg by mouth daily at 6  PM.     . vitamin B-12 (CYANOCOBALAMIN) 1000 MCG tablet Take 1,000 mcg by mouth daily.    . ondansetron (ZOFRAN) 8 MG tablet One pill every 8 hours as needed for nausea/vomitting. (Patient not taking: Reported on 04/16/2018) 40 tablet 1  . prochlorperazine (COMPAZINE) 10 MG tablet Take 1 tablet (10 mg total) by mouth every 6 (six) hours as needed for nausea or vomiting. Please note change in strength (Patient not taking: Reported on 04/16/2018) 60 tablet 4   No current facility-administered medications for this visit.    Facility-Administered Medications Ordered in Other Visits  Medication Dose Route Frequency Provider Last Rate Last Dose  . heparin lock flush 100 unit/mL  500 Units Intracatheter Once PRN Cammie Sickle, MD      . PACLitaxel (TAXOL) 114 mg in sodium chloride 0.9 % 250 mL chemo infusion (</= 80m/m2)  60 mg/m2 (Treatment Plan Recorded) Intravenous Once BCammie Sickle MD 269 mL/hr at 04/30/18 1004 114 mg at 04/30/18 1004  . sodium chloride flush (NS) 0.9 % injection 10 mL  10 mL Intravenous PRN BCammie Sickle MD   10 mL at 01/30/16 1054    PHYSICAL EXAMINATION: ECOG PERFORMANCE STATUS: 1 - Symptomatic but completely ambulatory  BP 134/85   Pulse 70   Temp 97.9 F (36.6 C) (Tympanic)   Resp 20   Ht 5' 2"  (1.575 m)   Wt 206 lb (93.4 kg)   BMI 37.68 kg/m   Filed Weights   04/30/18 0811  Weight: 206 lb (93.4 kg)    Physical Exam  Constitutional: She is oriented to person, place, and time and well-developed, well-nourished, and in no distress.  She is alone.  HENT:  Head: Normocephalic and  atraumatic.  Mouth/Throat: Oropharynx is clear and moist. No oropharyngeal exudate.  Eyes: Pupils are equal, round, and reactive to light.  Neck: Normal range of motion. Neck supple.  Cardiovascular: Normal rate and regular rhythm.  Pulmonary/Chest: No respiratory distress. She has no wheezes.  Abdominal: Soft. Bowel sounds are normal. She exhibits no  distension and no mass. There is no tenderness. There is no rebound and no guarding.  Musculoskeletal: Normal range of motion. She exhibits edema. She exhibits no tenderness.  Mild lower extremity swelling bilaterally.  Neurological: She is alert and oriented to person, place, and time.  Skin: Skin is warm.  Psychiatric: Affect normal.       LABORATORY DATA:  I have reviewed the data as listed    Component Value Date/Time   NA 138 04/30/2018 0754   NA 130 (L) 06/06/2014 1102   K 4.4 04/30/2018 0754   K 3.9 06/06/2014 1102   CL 104 04/30/2018 0754   CL 95 (L) 06/06/2014 1102   CO2 25 04/30/2018 0754   CO2 28 06/06/2014 1102   GLUCOSE 88 04/30/2018 0754   GLUCOSE 349 (H) 06/06/2014 1102   BUN 39 (H) 04/30/2018 0754   BUN 17 06/06/2014 1102   CREATININE 3.46 (H) 04/30/2018 0754   CREATININE 1.63 (H) 06/06/2014 1102   CALCIUM 8.8 (L) 04/30/2018 0754   CALCIUM 9.2 06/06/2014 1102   PROT 7.3 04/30/2018 0754   PROT 8.2 06/06/2014 1102   ALBUMIN 3.7 04/30/2018 0754   ALBUMIN 3.3 (L) 06/06/2014 1102   AST 17 04/30/2018 0754   AST 7 (L) 06/06/2014 1102   ALT 15 04/30/2018 0754   ALT 12 (L) 06/06/2014 1102   ALKPHOS 77 04/30/2018 0754   ALKPHOS 74 06/06/2014 1102   BILITOT 0.5 04/30/2018 0754   BILITOT 0.4 06/06/2014 1102   GFRNONAA 13 (L) 04/30/2018 0754   GFRNONAA 35 (L) 06/06/2014 1102   GFRAA 15 (L) 04/30/2018 0754   GFRAA 42 (L) 06/06/2014 1102    No results found for: SPEP, UPEP  Lab Results  Component Value Date   WBC 9.6 04/30/2018   NEUTROABS 5.5 04/16/2018   HGB 9.9 (L) 04/30/2018   HCT 28.8 (L) 04/30/2018   MCV 91.9 04/30/2018   PLT 365 04/30/2018      Chemistry      Component Value Date/Time   NA 138 04/30/2018 0754   NA 130 (L) 06/06/2014 1102   K 4.4 04/30/2018 0754   K 3.9 06/06/2014 1102   CL 104 04/30/2018 0754   CL 95 (L) 06/06/2014 1102   CO2 25 04/30/2018 0754   CO2 28 06/06/2014 1102   BUN 39 (H) 04/30/2018 0754   BUN 17 06/06/2014  1102   CREATININE 3.46 (H) 04/30/2018 0754   CREATININE 1.63 (H) 06/06/2014 1102      Component Value Date/Time   CALCIUM 8.8 (L) 04/30/2018 0754   CALCIUM 9.2 06/06/2014 1102   ALKPHOS 77 04/30/2018 0754   ALKPHOS 74 06/06/2014 1102   AST 17 04/30/2018 0754   AST 7 (L) 06/06/2014 1102   ALT 15 04/30/2018 0754   ALT 12 (L) 06/06/2014 1102   BILITOT 0.5 04/30/2018 0754   BILITOT 0.4 06/06/2014 1102       RADIOGRAPHIC STUDIES: I have personally reviewed the radiological images as listed and agreed with the findings in the report. No results found.   ASSESSMENT & PLAN:  Carcinoma of upper-inner quadrant of left breast in female, estrogen receptor positive ([place]) Left breast cancer-  stage IV- ER/PR +, HER-2/neu - on Taxol chemotherapy;  July 2019/AUG 2019- PET- overall STABLE; RLL nodule- clinically rounded atelectasis. STABLE.   # Continue Taxol 18m/m2; Labs today reviewed;  Acceptable for treatment today.   #Right lower lobe nodular mass pleural-based-likely atelectasis less likely progressive malignancy; no significant uptake noted on PET scan.  Stable.  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   # Bil LE swelling/ededma MILD- discussed re: compression stocking/ leg elevation; if not improve then- increase bumex.   # Chronic kidney disease - stage IV- creatinine 3.46; slightly worse;/increase in leg swelling-  discussed dietary compliance.   # ? Genetic predisposition- s/p Genetic counseling [8/29]- results pending.   # Type 2 DM- insulin dependent-STABLE  # Anemia- hemoglobin today-9-10; STABLE.   #  1 week-taxol/cbc. follow up in 2 weeks/labs/MD/Taxol.   No orders of the defined types were placed in this encounter.  All questions were answered. The patient knows to call the clinic with any problems, questions or concerns.      GCammie Sickle MD 04/30/2018 10:26 AM

## 2018-05-03 ENCOUNTER — Telehealth: Payer: Self-pay | Admitting: Genetics

## 2018-05-07 ENCOUNTER — Inpatient Hospital Stay: Payer: Medicare Other

## 2018-05-07 ENCOUNTER — Inpatient Hospital Stay: Payer: Medicare Other | Attending: Internal Medicine

## 2018-05-07 VITALS — BP 123/75 | HR 71 | Temp 96.7°F | Resp 20 | Wt 205.0 lb

## 2018-05-07 DIAGNOSIS — Z5111 Encounter for antineoplastic chemotherapy: Secondary | ICD-10-CM | POA: Diagnosis not present

## 2018-05-07 DIAGNOSIS — Z170 Estrogen receptor positive status [ER+]: Principal | ICD-10-CM

## 2018-05-07 DIAGNOSIS — G620 Drug-induced polyneuropathy: Secondary | ICD-10-CM | POA: Diagnosis not present

## 2018-05-07 DIAGNOSIS — Z794 Long term (current) use of insulin: Secondary | ICD-10-CM | POA: Diagnosis not present

## 2018-05-07 DIAGNOSIS — D649 Anemia, unspecified: Secondary | ICD-10-CM | POA: Insufficient documentation

## 2018-05-07 DIAGNOSIS — R918 Other nonspecific abnormal finding of lung field: Secondary | ICD-10-CM | POA: Diagnosis not present

## 2018-05-07 DIAGNOSIS — M7989 Other specified soft tissue disorders: Secondary | ICD-10-CM | POA: Insufficient documentation

## 2018-05-07 DIAGNOSIS — E1122 Type 2 diabetes mellitus with diabetic chronic kidney disease: Secondary | ICD-10-CM | POA: Diagnosis not present

## 2018-05-07 DIAGNOSIS — Z87891 Personal history of nicotine dependence: Secondary | ICD-10-CM | POA: Diagnosis not present

## 2018-05-07 DIAGNOSIS — N184 Chronic kidney disease, stage 4 (severe): Secondary | ICD-10-CM | POA: Diagnosis not present

## 2018-05-07 DIAGNOSIS — T451X5A Adverse effect of antineoplastic and immunosuppressive drugs, initial encounter: Secondary | ICD-10-CM | POA: Diagnosis not present

## 2018-05-07 DIAGNOSIS — C50212 Malignant neoplasm of upper-inner quadrant of left female breast: Secondary | ICD-10-CM

## 2018-05-07 DIAGNOSIS — Z79899 Other long term (current) drug therapy: Secondary | ICD-10-CM | POA: Insufficient documentation

## 2018-05-07 LAB — CBC WITH DIFFERENTIAL/PLATELET
Basophils Absolute: 0 10*3/uL (ref 0–0.1)
Basophils Relative: 0 %
EOS ABS: 0.1 10*3/uL (ref 0–0.7)
Eosinophils Relative: 1 %
HEMATOCRIT: 28.1 % — AB (ref 35.0–47.0)
Hemoglobin: 9.8 g/dL — ABNORMAL LOW (ref 12.0–16.0)
LYMPHS ABS: 1.4 10*3/uL (ref 1.0–3.6)
LYMPHS PCT: 16 %
MCH: 32 pg (ref 26.0–34.0)
MCHC: 34.9 g/dL (ref 32.0–36.0)
MCV: 91.8 fL (ref 80.0–100.0)
MONOS PCT: 9 %
Monocytes Absolute: 0.7 10*3/uL (ref 0.2–0.9)
NEUTROS ABS: 6.3 10*3/uL (ref 1.4–6.5)
NEUTROS PCT: 74 %
Platelets: 330 10*3/uL (ref 150–440)
RBC: 3.06 MIL/uL — ABNORMAL LOW (ref 3.80–5.20)
RDW: 14.5 % (ref 11.5–14.5)
WBC: 8.6 10*3/uL (ref 3.6–11.0)

## 2018-05-07 LAB — COMPREHENSIVE METABOLIC PANEL
ALT: 27 U/L (ref 0–44)
ANION GAP: 9 (ref 5–15)
AST: 34 U/L (ref 15–41)
Albumin: 3.7 g/dL (ref 3.5–5.0)
Alkaline Phosphatase: 88 U/L (ref 38–126)
BILIRUBIN TOTAL: 0.4 mg/dL (ref 0.3–1.2)
BUN: 47 mg/dL — AB (ref 8–23)
CHLORIDE: 107 mmol/L (ref 98–111)
CO2: 23 mmol/L (ref 22–32)
Calcium: 8.4 mg/dL — ABNORMAL LOW (ref 8.9–10.3)
Creatinine, Ser: 3.1 mg/dL — ABNORMAL HIGH (ref 0.44–1.00)
GFR, EST AFRICAN AMERICAN: 18 mL/min — AB (ref >60)
GFR, EST NON AFRICAN AMERICAN: 15 mL/min — AB (ref >60)
Glucose, Bld: 68 mg/dL — ABNORMAL LOW (ref 70–99)
POTASSIUM: 4 mmol/L (ref 3.5–5.1)
Sodium: 139 mmol/L (ref 135–145)
TOTAL PROTEIN: 7.1 g/dL (ref 6.5–8.1)

## 2018-05-07 MED ORDER — SODIUM CHLORIDE 0.9% FLUSH
10.0000 mL | INTRAVENOUS | Status: DC | PRN
  Administered 2018-05-07: 10 mL
  Filled 2018-05-07: qty 10

## 2018-05-07 MED ORDER — FAMOTIDINE IN NACL 20-0.9 MG/50ML-% IV SOLN
20.0000 mg | Freq: Once | INTRAVENOUS | Status: AC
  Administered 2018-05-07: 20 mg via INTRAVENOUS
  Filled 2018-05-07: qty 50

## 2018-05-07 MED ORDER — DIPHENHYDRAMINE HCL 50 MG/ML IJ SOLN
50.0000 mg | Freq: Once | INTRAMUSCULAR | Status: AC
  Administered 2018-05-07: 50 mg via INTRAVENOUS
  Filled 2018-05-07: qty 1

## 2018-05-07 MED ORDER — DEXAMETHASONE SODIUM PHOSPHATE 10 MG/ML IJ SOLN
8.0000 mg | Freq: Once | INTRAMUSCULAR | Status: AC
  Administered 2018-05-07: 8 mg via INTRAVENOUS
  Filled 2018-05-07: qty 1

## 2018-05-07 MED ORDER — SODIUM CHLORIDE 0.9 % IV SOLN
Freq: Once | INTRAVENOUS | Status: AC
  Administered 2018-05-07: 09:00:00 via INTRAVENOUS
  Filled 2018-05-07: qty 250

## 2018-05-07 MED ORDER — HEPARIN SOD (PORK) LOCK FLUSH 100 UNIT/ML IV SOLN
500.0000 [IU] | Freq: Once | INTRAVENOUS | Status: AC | PRN
  Administered 2018-05-07: 500 [IU]
  Filled 2018-05-07: qty 5

## 2018-05-07 MED ORDER — SODIUM CHLORIDE 0.9 % IV SOLN
60.0000 mg/m2 | Freq: Once | INTRAVENOUS | Status: AC
  Administered 2018-05-07: 114 mg via INTRAVENOUS
  Filled 2018-05-07: qty 19

## 2018-05-13 ENCOUNTER — Other Ambulatory Visit: Payer: Self-pay | Admitting: *Deleted

## 2018-05-13 DIAGNOSIS — C50212 Malignant neoplasm of upper-inner quadrant of left female breast: Secondary | ICD-10-CM

## 2018-05-13 DIAGNOSIS — Z170 Estrogen receptor positive status [ER+]: Principal | ICD-10-CM

## 2018-05-14 ENCOUNTER — Inpatient Hospital Stay (HOSPITAL_BASED_OUTPATIENT_CLINIC_OR_DEPARTMENT_OTHER): Payer: Medicare Other | Admitting: Internal Medicine

## 2018-05-14 ENCOUNTER — Inpatient Hospital Stay: Payer: Medicare Other

## 2018-05-14 ENCOUNTER — Encounter: Payer: Self-pay | Admitting: Internal Medicine

## 2018-05-14 VITALS — BP 118/74 | HR 72 | Resp 18

## 2018-05-14 VITALS — BP 138/86 | HR 70 | Temp 97.3°F | Resp 16 | Wt 204.0 lb

## 2018-05-14 DIAGNOSIS — Z5111 Encounter for antineoplastic chemotherapy: Secondary | ICD-10-CM | POA: Diagnosis not present

## 2018-05-14 DIAGNOSIS — Z794 Long term (current) use of insulin: Secondary | ICD-10-CM

## 2018-05-14 DIAGNOSIS — C50212 Malignant neoplasm of upper-inner quadrant of left female breast: Secondary | ICD-10-CM | POA: Diagnosis not present

## 2018-05-14 DIAGNOSIS — Z170 Estrogen receptor positive status [ER+]: Principal | ICD-10-CM

## 2018-05-14 DIAGNOSIS — R918 Other nonspecific abnormal finding of lung field: Secondary | ICD-10-CM

## 2018-05-14 DIAGNOSIS — Z87891 Personal history of nicotine dependence: Secondary | ICD-10-CM | POA: Diagnosis not present

## 2018-05-14 DIAGNOSIS — M7989 Other specified soft tissue disorders: Secondary | ICD-10-CM | POA: Diagnosis not present

## 2018-05-14 DIAGNOSIS — D649 Anemia, unspecified: Secondary | ICD-10-CM

## 2018-05-14 DIAGNOSIS — N184 Chronic kidney disease, stage 4 (severe): Secondary | ICD-10-CM

## 2018-05-14 DIAGNOSIS — Z79899 Other long term (current) drug therapy: Secondary | ICD-10-CM | POA: Diagnosis not present

## 2018-05-14 DIAGNOSIS — G620 Drug-induced polyneuropathy: Secondary | ICD-10-CM | POA: Diagnosis not present

## 2018-05-14 DIAGNOSIS — E1122 Type 2 diabetes mellitus with diabetic chronic kidney disease: Secondary | ICD-10-CM

## 2018-05-14 LAB — CBC WITH DIFFERENTIAL/PLATELET
Abs Immature Granulocytes: 0.18 10*3/uL — ABNORMAL HIGH (ref 0.00–0.07)
Basophils Absolute: 0 10*3/uL (ref 0.0–0.1)
Basophils Relative: 1 %
EOS PCT: 1 %
Eosinophils Absolute: 0.1 10*3/uL (ref 0.0–0.5)
HEMATOCRIT: 27.2 % — AB (ref 36.0–46.0)
Hemoglobin: 9 g/dL — ABNORMAL LOW (ref 12.0–15.0)
IMMATURE GRANULOCYTES: 2 %
LYMPHS ABS: 0.8 10*3/uL (ref 0.7–4.0)
Lymphocytes Relative: 11 %
MCH: 30.1 pg (ref 26.0–34.0)
MCHC: 33.1 g/dL (ref 30.0–36.0)
MCV: 91 fL (ref 80.0–100.0)
MONOS PCT: 7 %
Monocytes Absolute: 0.5 10*3/uL (ref 0.1–1.0)
NEUTROS PCT: 78 %
Neutro Abs: 6.1 10*3/uL (ref 1.7–7.7)
Platelets: 332 10*3/uL (ref 150–400)
RBC: 2.99 MIL/uL — ABNORMAL LOW (ref 3.87–5.11)
RDW: 13.5 % (ref 11.5–15.5)
WBC: 7.6 10*3/uL (ref 4.0–10.5)
nRBC: 0 % (ref 0.0–0.2)

## 2018-05-14 LAB — COMPREHENSIVE METABOLIC PANEL
ALK PHOS: 65 U/L (ref 38–126)
ALT: 20 U/L (ref 0–44)
AST: 18 U/L (ref 15–41)
Albumin: 3.7 g/dL (ref 3.5–5.0)
Anion gap: 9 (ref 5–15)
BUN: 41 mg/dL — ABNORMAL HIGH (ref 8–23)
CALCIUM: 8.3 mg/dL — AB (ref 8.9–10.3)
CO2: 22 mmol/L (ref 22–32)
CREATININE: 2.85 mg/dL — AB (ref 0.44–1.00)
Chloride: 107 mmol/L (ref 98–111)
GFR, EST AFRICAN AMERICAN: 19 mL/min — AB (ref >60)
GFR, EST NON AFRICAN AMERICAN: 17 mL/min — AB (ref >60)
Glucose, Bld: 98 mg/dL (ref 70–99)
Potassium: 3.8 mmol/L (ref 3.5–5.1)
Sodium: 138 mmol/L (ref 135–145)
Total Bilirubin: 0.5 mg/dL (ref 0.3–1.2)
Total Protein: 6.8 g/dL (ref 6.5–8.1)

## 2018-05-14 MED ORDER — HEPARIN SOD (PORK) LOCK FLUSH 100 UNIT/ML IV SOLN
500.0000 [IU] | Freq: Once | INTRAVENOUS | Status: AC
  Administered 2018-05-14: 500 [IU] via INTRAVENOUS
  Filled 2018-05-14: qty 5

## 2018-05-14 MED ORDER — SODIUM CHLORIDE 0.9 % IV SOLN
60.0000 mg/m2 | Freq: Once | INTRAVENOUS | Status: AC
  Administered 2018-05-14: 114 mg via INTRAVENOUS
  Filled 2018-05-14: qty 19

## 2018-05-14 MED ORDER — SODIUM CHLORIDE 0.9 % IV SOLN
Freq: Once | INTRAVENOUS | Status: AC
  Administered 2018-05-14: 10:00:00 via INTRAVENOUS
  Filled 2018-05-14: qty 250

## 2018-05-14 MED ORDER — DEXAMETHASONE SODIUM PHOSPHATE 10 MG/ML IJ SOLN
8.0000 mg | Freq: Once | INTRAMUSCULAR | Status: AC
  Administered 2018-05-14: 8 mg via INTRAVENOUS
  Filled 2018-05-14: qty 1

## 2018-05-14 MED ORDER — FAMOTIDINE IN NACL 20-0.9 MG/50ML-% IV SOLN
20.0000 mg | Freq: Once | INTRAVENOUS | Status: AC
  Administered 2018-05-14: 20 mg via INTRAVENOUS
  Filled 2018-05-14: qty 50

## 2018-05-14 MED ORDER — SODIUM CHLORIDE 0.9% FLUSH
10.0000 mL | INTRAVENOUS | Status: DC | PRN
  Administered 2018-05-14: 10 mL via INTRAVENOUS
  Filled 2018-05-14: qty 10

## 2018-05-14 MED ORDER — DIPHENHYDRAMINE HCL 50 MG/ML IJ SOLN
50.0000 mg | Freq: Once | INTRAMUSCULAR | Status: AC
  Administered 2018-05-14: 50 mg via INTRAVENOUS
  Filled 2018-05-14: qty 1

## 2018-05-14 NOTE — Progress Notes (Signed)
[place] OFFICE PROGRESS NOTE  Patient Care Team: [name], [name], [name]; [name]; [name]; [name]; EBUS- Positive Paratrac/subcarinal LN s/p ? Taxotere [in [place]; [name]; [name];[name]; [name]; suv 3]; [name]; [name]; [name];  [name]; Ant R iliac bone; [name]; [name]; START-Taxol weekly.   # Poorly controlled Blood sugars- improved.   # Pancreatitis Hx/ CKD IV [creat ~ 3-4; [name]; [name]; mild left sided weakness]  # MOLECULAR TESTING: NA []  ------------------------------------------------   DIAGNOSIS: [ 2015] BREAST CA; [name]; [name]    ;[name], estrogen receptor positive ([place])      INTERVAL HISTORY:  [name] 62 y.o.  female pleasant patient above history of ER PR positive HER-2 negative breast cancer on Taxol is here for follow-up.  Admits the leg swelling is improved.  She has been resting more currently.   No worsening shortness of breath or cough.  Mild tingling and numbness in the hands.  Not any significantly worse.  No cough or Fevers or chills.  Review of  Systems  Constitutional: Positive for malaise/fatigue. Negative for chills, diaphoresis, [name], hemoptysis, sputum production, shortness of breath and wheezing.   Cardiovascular: Positive for leg swelling. Negative for chest pain, palpitations and orthopnea.  Gastrointestinal: Negative for abdominal pain, blood in stool, constipation, diarrhea, heartburn, melena, nausea and vomiting.  Genitourinary: Negative for dysuria, frequency and urgency.  Musculoskeletal: Negative for back pain and joint pain.  Skin: Negative.  Negative for itching and rash.  Neurological: Positive for tingling. Negative for dizziness, focal weakness, weakness and headaches.  Endo/Heme/Allergies: Does not bruise/bleed easily.  Psychiatric/Behavioral: Negative for depression. The patient is not nervous/anxious and does not have insomnia.       PAST MEDICAL HISTORY :  Past Medical History:  Diagnosis Date  . Asthma   . Cancer ([place]) 03/10/2018   Per [facility] PET order. Carcinoma of upper-inner quadrant of left breast in female, estrogen receptor positive .  Marland Kitchen CHF (congestive heart failure) ([place]) 1997  . CKD (chronic kidney disease)   . Depression   . Diabetes mellitus, type 2 ([place])   . Family history of breast cancer   . Family history of colon cancer   . Family history of ovarian cancer   . Family history of pancreatic cancer   . Family history of prostate cancer   . Family history of stomach cancer   . Hair loss   . History of left breast cancer 05/29/14  . History of partial hysterectomy 12/31/2016   Per patient.  Has not had a period in years.  Had a partial hysterectomy years ago.  . Obesity   . Pancreatitis 1997  . Stroke [facility]) 2010   with mild left arm weakness    PAST SURGICAL HISTORY :   Past Surgical History:  Procedure Laterality Date  . CESAREAN SECTION    .  CHOLECYSTECTOMY    . PARTIAL HYSTERECTOMY  12/31/2016   Per patient, she has not had a period in years since she had a partial hysterectomy.    FAMILY HISTORY :   Family History  Problem Relation Age of Onset  . Ovarian cancer Mother 40  . Diabetes Mother   . Hypertension Mother   . COPD Father   . Hypertension Father   . Colon cancer Father 64  . Diabetes Sister   . Breast cancer Sister 10       bilateral  . Diabetes Brother   . Leukemia Maternal Aunt   . Pancreatic cancer Paternal Aunt 37  . Pancreatic cancer Paternal Uncle   . Colon cancer Paternal Uncle   . Stomach cancer Maternal Grandfather 27  . Throat cancer Paternal Grandmother   . Breast cancer Maternal Aunt 80  . Colon cancer Maternal Aunt   . Bone cancer Maternal Aunt   . Breast cancer Paternal Aunt        dx >50  . Prostate cancer Paternal Uncle   . Pancreatic cancer Paternal Uncle   . Throat cancer Paternal Uncle   . Lung cancer Paternal Uncle   . Stomach cancer Paternal Uncle   . Brain cancer Paternal Aunt   . Cancer Cousin        liver, kidney  . Prostate cancer Cousin        meastatic  . Lung cancer Other     SOCIAL HISTORY:   Social History   Tobacco Use  . Smoking status: Former Smoker    Packs/day: 0.50    Years: 1.00    Pack years: 0.50    Types: Cigarettes  . Smokeless tobacco: Never Used  Substance Use Topics  . Alcohol use: No    Alcohol/week: 0.0 standard drinks  . Drug use: No    ALLERGIES:  has No Known Allergies.  MEDICATIONS:  Current Outpatient Medications  Medication Sig Dispense Refill  . acetaminophen-codeine (TYLENOL #4) 300-60 MG tablet Take 2 tablets by mouth every 6 (six) hours as needed for moderate pain.     Marland Kitchen albuterol (PROAIR HFA) 108 (90 BASE) MCG/ACT inhaler Inhale 1 puff into the lungs every 6 (six) hours as needed for shortness of breath.     Marland Kitchen albuterol (PROVENTIL) (2.5 MG/3ML) 0.083% nebulizer solution Inhale 2.5 mg into the lungs every 6 (six) hours as  needed for shortness of breath.     . ALPRAZolam (XANAX) 0.5 MG tablet Take 0.5 mg by mouth at bedtime as needed for anxiety or sleep.     Marland Kitchen amLODipine (NORVASC) 10 MG tablet Take 10 mg by mouth daily.     Marland Kitchen aspirin EC 81 MG tablet Take 81 mg by mouth once.     Marland Kitchen atenolol (TENORMIN) 100 MG tablet TAKE 1 TABLETS BY MOUTH TWICE DAILY    . B-D ULTRA-FINE 33 LANCETS MISC Use 1 each 2 (two) times daily.    . B-D ULTRAFINE III SHORT PEN 31G X 8 MM MISC     . bumetanide (BUMEX) 0.5 MG tablet TAKE 1 TABLET BY MOUTH TWICE DAILY    . calcitRIOL (ROCALTROL)  0.25 MCG capsule Take 0.25 mcg by mouth 3 (three) times a week.    . Cinnamon 500 MG capsule Take 500 mg by mouth daily.     . cloNIDine (CATAPRES) 0.2 MG tablet Take 0.2 mg by mouth 2 (two) times daily.     . enalapril (VASOTEC) 20 MG tablet Take 20 mg by mouth 2 (two) times daily.     . ferrous sulfate 325 (65 FE) MG tablet Take 325 mg by mouth 2 (two) times daily with a meal.    . FLUoxetine (PROZAC) 20 MG capsule Take 20 mg by mouth 2 (two) times daily.     Marland Kitchen gabapentin (NEURONTIN) 100 MG capsule Take 1 capsule (100 mg total) by mouth at bedtime. 30 capsule 2  . glucose blood (ONE TOUCH ULTRA TEST) test strip Use 1 each 2 (two) times daily. Use as instructed.    . glyBURIDE (DIABETA) 5 MG tablet Take 5 mg by mouth daily with breakfast.     . LEVEMIR FLEXTOUCH 100 UNIT/ML Pen Inject 55 Units into the skin daily.     . metoprolol tartrate (LOPRESSOR) 25 MG tablet TAKE 1 TABLET BY MOUTH TWICE DAILY 60 tablet 0  . mometasone (NASONEX) 50 MCG/ACT nasal spray Place 2 sprays into the nose daily as needed.     Marland Kitchen NOVOLOG FLEXPEN 100 UNIT/ML FlexPen Inject 7 Units into the skin 2 (two) times daily at 8 am and 10 pm.     . ondansetron (ZOFRAN) 8 MG tablet One pill every 8 hours as needed for nausea/vomitting. 40 tablet 1  . prochlorperazine (COMPAZINE) 10 MG tablet Take 1 tablet (10 mg total) by mouth every 6 (six) hours as needed for nausea or vomiting.  Please note change in strength 60 tablet 4  . salmeterol (SEREVENT) 50 MCG/DOSE diskus inhaler Inhale 1 puff into the lungs 2 (two) times daily.    . simvastatin (ZOCOR) 20 MG tablet Take 20 mg by mouth daily at 6 PM.     . vitamin B-12 (CYANOCOBALAMIN) 1000 MCG tablet Take 1,000 mcg by mouth daily.     No current facility-administered medications for this visit.    Facility-Administered Medications Ordered in Other Visits  Medication Dose Route Frequency Provider Last Rate Last Dose  . sodium chloride flush (NS) 0.9 % injection 10 mL  10 mL Intravenous PRN Cammie Sickle, MD   10 mL at 01/30/16 1054    PHYSICAL EXAMINATION: ECOG PERFORMANCE STATUS: 1 - Symptomatic but completely ambulatory  BP 138/86 (BP Location: Left Arm, Patient Position: Sitting)   Pulse 70   Temp (!) 97.3 F (36.3 C) (Tympanic)   Resp 16   Wt 204 lb (92.5 kg)   BMI 37.31 kg/m   Filed Weights   05/14/18 0851  Weight: 204 lb (92.5 kg)    Physical Exam  Constitutional: She is oriented to person, place, and time and well-developed, well-nourished, and in no distress.  She is alone.  HENT:  Head: Normocephalic and atraumatic.  Mouth/Throat: Oropharynx is clear and moist. No oropharyngeal exudate.  Eyes: Pupils are equal, round, and reactive to light.  Neck: Normal range of motion. Neck supple.  Cardiovascular: Normal rate and regular rhythm.  Pulmonary/Chest: No respiratory distress. She has no wheezes.  Abdominal: Soft. Bowel sounds are normal. She exhibits no distension and no mass. There is no tenderness. There is no rebound and no guarding.  Musculoskeletal: Normal range of motion. She exhibits edema. She exhibits no tenderness.  Mild lower extremity  swelling bilaterally.  Neurological: She is alert and oriented to person, place, and time.  Skin: Skin is warm.  Psychiatric: Affect normal.       LABORATORY DATA:  I have reviewed the data as listed    Component Value Date/Time   NA 138  05/14/2018 0831   NA 130 (L) 06/06/2014 1102   K 3.8 05/14/2018 0831   K 3.9 06/06/2014 1102   CL 107 05/14/2018 0831   CL 95 (L) 06/06/2014 1102   CO2 22 05/14/2018 0831   CO2 28 06/06/2014 1102   GLUCOSE 98 05/14/2018 0831   GLUCOSE 349 (H) 06/06/2014 1102   BUN 41 (H) 05/14/2018 0831   BUN 17 06/06/2014 1102   CREATININE 2.85 (H) 05/14/2018 0831   CREATININE 1.63 (H) 06/06/2014 1102   CALCIUM 8.3 (L) 05/14/2018 0831   CALCIUM 9.2 06/06/2014 1102   PROT 6.8 05/14/2018 0831   PROT 8.2 06/06/2014 1102   ALBUMIN 3.7 05/14/2018 0831   ALBUMIN 3.3 (L) 06/06/2014 1102   AST 18 05/14/2018 0831   AST 7 (L) 06/06/2014 1102   ALT 20 05/14/2018 0831   ALT 12 (L) 06/06/2014 1102   ALKPHOS 65 05/14/2018 0831   ALKPHOS 74 06/06/2014 1102   BILITOT 0.5 05/14/2018 0831   BILITOT 0.4 06/06/2014 1102   GFRNONAA 17 (L) 05/14/2018 0831   GFRNONAA 35 (L) 06/06/2014 1102   GFRAA 19 (L) 05/14/2018 0831   GFRAA 42 (L) 06/06/2014 1102    No results found for: SPEP, UPEP  Lab Results  Component Value Date   WBC 7.6 05/14/2018   NEUTROABS 6.1 05/14/2018   HGB 9.0 (L) 05/14/2018   HCT 27.2 (L) 05/14/2018   MCV 91.0 05/14/2018   PLT 332 05/14/2018      Chemistry      Component Value Date/Time   NA 138 05/14/2018 0831   NA 130 (L) 06/06/2014 1102   K 3.8 05/14/2018 0831   K 3.9 06/06/2014 1102   CL 107 05/14/2018 0831   CL 95 (L) 06/06/2014 1102   CO2 22 05/14/2018 0831   CO2 28 06/06/2014 1102   BUN 41 (H) 05/14/2018 0831   BUN 17 06/06/2014 1102   CREATININE 2.85 (H) 05/14/2018 0831   CREATININE 1.63 (H) 06/06/2014 1102      Component Value Date/Time   CALCIUM 8.3 (L) 05/14/2018 0831   CALCIUM 9.2 06/06/2014 1102   ALKPHOS 65 05/14/2018 0831   ALKPHOS 74 06/06/2014 1102   AST 18 05/14/2018 0831   AST 7 (L) 06/06/2014 1102   ALT 20 05/14/2018 0831   ALT 12 (L) 06/06/2014 1102   BILITOT 0.5 05/14/2018 0831   BILITOT 0.4 06/06/2014 1102       RADIOGRAPHIC STUDIES: I  have personally reviewed the radiological images as listed and agreed with the findings in the report. No results found.   ASSESSMENT & PLAN:  Carcinoma of upper-inner quadrant of left breast in female, estrogen receptor positive ([place]) Left breast cancer- stage IV- ER/PR +, HER-2/neu - on Taxol chemotherapy;  July 2019/AUG 2019- PET- overall STABLE; RLL nodule- clinically rounded atelectasis. STABLE.   # Continue Taxol 69m/m2; Labs today reviewed;  Acceptable for treatment today. Will order CT/PET at next visit.  #Right lower lobe nodular mass pleural-based-likely atelectasis less likely progressive malignancy; no significant uptake noted on PET scan. STABLE.  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   # Bil LE swelling/ededma MILD- discussed re:  compression stocking/ leg improved.   # Chronic kidney disease - stage IV- creatinine 2.85; STABLE.  # ? Genetic predisposition- s/p Genetic counseling [8/29]- results pending.   # Type 2 DM- insulin dependent-STABLE  # Anemia- hemoglobin today-9-10; STABLE; on PO iron.   #  DISPOSITION:  #Treatment today # follow up in 2 weeks/labs/MD-cbc/cmp-ca-27-29-/Taxol-Dr.B   Orders Placed This Encounter  Procedures  . CBC with Differential/Platelet    Standing Status:   Future    Standing Expiration Date:   05/15/2019  . Comprehensive metabolic panel    Standing Status:   Future    Standing Expiration Date:   05/15/2019   All questions were answered. The patient knows to call the clinic with any problems, questions or concerns.      Cammie Sickle, MD 05/14/2018 6:35 PM

## 2018-05-14 NOTE — Assessment & Plan Note (Addendum)
Left breast cancer- stage IV- ER/PR +, HER-2/neu - on Taxol chemotherapy;  July 2019/AUG 2019- PET- overall STABLE; RLL nodule- clinically rounded atelectasis. STABLE.   # Continue Taxol 49m/m2; Labs today reviewed;  Acceptable for treatment today. Will order CT/PET at next visit.  #Right lower lobe nodular mass pleural-based-likely atelectasis less likely progressive malignancy; no significant uptake noted on PET scan. STABLE.  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   # Bil LE swelling/ededma MILD- discussed re: compression stocking/ leg improved.   # Chronic kidney disease - stage IV- creatinine 2.85; STABLE.  # ? Genetic predisposition- s/p Genetic counseling [8/29]- results pending.   # Type 2 DM- insulin dependent-STABLE  # Anemia- hemoglobin today-9-10; STABLE; on PO iron.   #  DISPOSITION:  #Treatment today # follow up in 2 weeks/labs/MD-cbc/cmp-ca-27-29-/Taxol-Dr.B

## 2018-05-21 ENCOUNTER — Ambulatory Visit: Payer: Medicare Other

## 2018-05-21 ENCOUNTER — Other Ambulatory Visit: Payer: Medicare Other

## 2018-05-24 NOTE — Telephone Encounter (Signed)
Left message asking pt to call back to discuss genetic test results.

## 2018-05-28 ENCOUNTER — Inpatient Hospital Stay: Payer: Medicare Other

## 2018-05-28 ENCOUNTER — Inpatient Hospital Stay (HOSPITAL_BASED_OUTPATIENT_CLINIC_OR_DEPARTMENT_OTHER): Payer: Medicare Other | Admitting: Internal Medicine

## 2018-05-28 ENCOUNTER — Other Ambulatory Visit: Payer: Self-pay

## 2018-05-28 VITALS — BP 171/94 | HR 80 | Temp 96.1°F | Resp 18 | Wt 206.6 lb

## 2018-05-28 DIAGNOSIS — Z87891 Personal history of nicotine dependence: Secondary | ICD-10-CM | POA: Diagnosis not present

## 2018-05-28 DIAGNOSIS — Z79899 Other long term (current) drug therapy: Secondary | ICD-10-CM

## 2018-05-28 DIAGNOSIS — C50212 Malignant neoplasm of upper-inner quadrant of left female breast: Secondary | ICD-10-CM

## 2018-05-28 DIAGNOSIS — E1122 Type 2 diabetes mellitus with diabetic chronic kidney disease: Secondary | ICD-10-CM | POA: Diagnosis not present

## 2018-05-28 DIAGNOSIS — D649 Anemia, unspecified: Secondary | ICD-10-CM | POA: Diagnosis not present

## 2018-05-28 DIAGNOSIS — Z170 Estrogen receptor positive status [ER+]: Secondary | ICD-10-CM | POA: Diagnosis not present

## 2018-05-28 DIAGNOSIS — N184 Chronic kidney disease, stage 4 (severe): Secondary | ICD-10-CM | POA: Diagnosis not present

## 2018-05-28 DIAGNOSIS — Z794 Long term (current) use of insulin: Secondary | ICD-10-CM | POA: Diagnosis not present

## 2018-05-28 DIAGNOSIS — M7989 Other specified soft tissue disorders: Secondary | ICD-10-CM | POA: Diagnosis not present

## 2018-05-28 DIAGNOSIS — G620 Drug-induced polyneuropathy: Secondary | ICD-10-CM | POA: Diagnosis not present

## 2018-05-28 DIAGNOSIS — Z5111 Encounter for antineoplastic chemotherapy: Secondary | ICD-10-CM | POA: Diagnosis not present

## 2018-05-28 DIAGNOSIS — R918 Other nonspecific abnormal finding of lung field: Secondary | ICD-10-CM | POA: Diagnosis not present

## 2018-05-28 LAB — CBC WITH DIFFERENTIAL/PLATELET
ABS IMMATURE GRANULOCYTES: 0.15 10*3/uL — AB (ref 0.00–0.07)
Basophils Absolute: 0.1 10*3/uL (ref 0.0–0.1)
Basophils Relative: 1 %
Eosinophils Absolute: 0.1 10*3/uL (ref 0.0–0.5)
Eosinophils Relative: 1 %
HEMATOCRIT: 29.2 % — AB (ref 36.0–46.0)
HEMOGLOBIN: 9.8 g/dL — AB (ref 12.0–15.0)
Immature Granulocytes: 2 %
LYMPHS ABS: 1.4 10*3/uL (ref 0.7–4.0)
LYMPHS PCT: 16 %
MCH: 30.4 pg (ref 26.0–34.0)
MCHC: 33.6 g/dL (ref 30.0–36.0)
MCV: 90.7 fL (ref 80.0–100.0)
MONO ABS: 1 10*3/uL (ref 0.1–1.0)
Monocytes Relative: 12 %
NEUTROS ABS: 5.9 10*3/uL (ref 1.7–7.7)
Neutrophils Relative %: 68 %
Platelets: 313 10*3/uL (ref 150–400)
RBC: 3.22 MIL/uL — AB (ref 3.87–5.11)
RDW: 12.9 % (ref 11.5–15.5)
WBC: 8.6 10*3/uL (ref 4.0–10.5)
nRBC: 0 % (ref 0.0–0.2)

## 2018-05-28 LAB — COMPREHENSIVE METABOLIC PANEL
ALBUMIN: 3.9 g/dL (ref 3.5–5.0)
ALT: 16 U/L (ref 0–44)
AST: 18 U/L (ref 15–41)
Alkaline Phosphatase: 67 U/L (ref 38–126)
Anion gap: 8 (ref 5–15)
BUN: 47 mg/dL — AB (ref 8–23)
CO2: 24 mmol/L (ref 22–32)
Calcium: 9 mg/dL (ref 8.9–10.3)
Chloride: 107 mmol/L (ref 98–111)
Creatinine, Ser: 3.17 mg/dL — ABNORMAL HIGH (ref 0.44–1.00)
GFR calc Af Amer: 17 mL/min — ABNORMAL LOW (ref >60)
GFR calc non Af Amer: 15 mL/min — ABNORMAL LOW (ref >60)
GLUCOSE: 64 mg/dL — AB (ref 70–99)
POTASSIUM: 4.1 mmol/L (ref 3.5–5.1)
SODIUM: 139 mmol/L (ref 135–145)
Total Bilirubin: 0.7 mg/dL (ref 0.3–1.2)
Total Protein: 7.5 g/dL (ref 6.5–8.1)

## 2018-05-28 MED ORDER — DIPHENHYDRAMINE HCL 50 MG/ML IJ SOLN
50.0000 mg | Freq: Once | INTRAMUSCULAR | Status: AC
  Administered 2018-05-28: 50 mg via INTRAVENOUS
  Filled 2018-05-28: qty 1

## 2018-05-28 MED ORDER — SODIUM CHLORIDE 0.9% FLUSH
10.0000 mL | INTRAVENOUS | Status: DC | PRN
  Administered 2018-05-28: 10 mL via INTRAVENOUS
  Filled 2018-05-28: qty 10

## 2018-05-28 MED ORDER — DEXAMETHASONE SODIUM PHOSPHATE 10 MG/ML IJ SOLN
8.0000 mg | Freq: Once | INTRAMUSCULAR | Status: AC
  Administered 2018-05-28: 8 mg via INTRAVENOUS
  Filled 2018-05-28: qty 1

## 2018-05-28 MED ORDER — SODIUM CHLORIDE 0.9 % IV SOLN
60.0000 mg/m2 | Freq: Once | INTRAVENOUS | Status: AC
  Administered 2018-05-28: 114 mg via INTRAVENOUS
  Filled 2018-05-28: qty 19

## 2018-05-28 MED ORDER — FAMOTIDINE IN NACL 20-0.9 MG/50ML-% IV SOLN
20.0000 mg | Freq: Once | INTRAVENOUS | Status: AC
  Administered 2018-05-28: 20 mg via INTRAVENOUS
  Filled 2018-05-28: qty 50

## 2018-05-28 MED ORDER — SODIUM CHLORIDE 0.9 % IV SOLN
Freq: Once | INTRAVENOUS | Status: AC
  Administered 2018-05-28: 09:00:00 via INTRAVENOUS
  Filled 2018-05-28: qty 250

## 2018-05-28 MED ORDER — HEPARIN SOD (PORK) LOCK FLUSH 100 UNIT/ML IV SOLN
500.0000 [IU] | Freq: Once | INTRAVENOUS | Status: AC
  Administered 2018-05-28: 500 [IU] via INTRAVENOUS
  Filled 2018-05-28: qty 5

## 2018-05-28 NOTE — Assessment & Plan Note (Addendum)
Left breast cancer- stage IV- ER/PR +, HER-2/neu - on Taxol chemotherapy;  July 2019/AUG 2019- PET- overall STABLE; RLL nodule- clinically rounded atelectasis. STABLE.   # Continue Taxol 16m/m2; Labs today reviewed;  Acceptable for treatment today. Will order PET scan today.   #Right lower lobe nodular mass pleural-based-likely atelectasis less likely progressive malignancy; no significant uptake noted on PET scan.STABLE.   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   # Bil LE swelling/ededma MILD- discussed re: compression stocking/ leg improved.   # Chronic kidney disease - stage IV- creatinine 2.85; STABLE.  # ? Genetic predisposition- s/p Genetic counseling [8/29]- awaiting results.   # Type 2 DM- insulin dependent/brtiile- sugars- 64 recommend orange juice/crackers.   # Anemia- hemoglobin today-9-10; STABLE; on PO iron.   #  DISPOSITION:  # Treatment today; add ca-27-29 today # 1 week- cbc/bmp/taxol # follow up in 2 weeks/labs/MD-cbc/cmp-ca--/Taxol-Dr.B

## 2018-05-28 NOTE — Progress Notes (Signed)
Here for follow up. No c/o" everything is fine "

## 2018-05-28 NOTE — Progress Notes (Signed)
[facility] OFFICE PROGRESS NOTE  Patient Care Team: [name], [name], [name]; [name]; [name]; [name]; EBUS- Positive Paratrac/subcarinal LN s/p ? Taxotere [in [place]; [name]; [name];[name]; [name]; suv 3]; [name]; [name]; [name];  [name]; Ant R iliac bone; [name]; [name]; START-Taxol weekly.   # Poorly controlled Blood sugars- improved.   # Pancreatitis Hx/ CKD IV [creat ~ 3-4; [name]; [name]; mild left sided weakness]  # MOLECULAR TESTING: NA [] ------------------------------------------------   DIAGNOSIS: [ 6226] BREAST CA; [name]; [name]    ;[name], estrogen receptor positive ([place])      INTERVAL HISTORY:  [name] 62 y.o.  female pleasant patient above history of ER PR positive HER-2 negative breast cancer on Taxol is here for follow-up.  Patient denies any worsening shortness of breath or cough.  Complains of mild tingling and numbness in the hands.  Not worse.  No fever chills nausea vomiting.   Review of Systems  Constitutional: Positive for malaise/fatigue.  Negative for chills, diaphoresis, [name], hemoptysis, sputum production, [name], palpitations and orthopnea.  Gastrointestinal: Negative for abdominal pain, blood in stool, constipation, diarrhea, heartburn, melena, nausea and vomiting.  Genitourinary: Negative for dysuria, frequency and urgency.  Musculoskeletal: Negative for back pain and joint pain.  Skin: Negative.  Negative for itching and rash.  Neurological: Positive for tingling. Negative for dizziness, focal weakness, weakness and headaches.  Endo/Heme/Allergies: Does not bruise/bleed easily.  Psychiatric/Behavioral: Negative for depression. The patient is not nervous/anxious and does not have insomnia.       PAST MEDICAL HISTORY :  Past Medical History:  Diagnosis Date  . Asthma   . Cancer ([place]) 03/10/2018   Per NM PET order. Carcinoma of upper-inner quadrant of left breast in female, estrogen receptor positive .  Marland Kitchen CHF (congestive heart failure) ([place]) 1997  . CKD (chronic kidney disease)   . Depression   . Diabetes mellitus, type 2 ([place])   . Family history of breast cancer   . Family history of colon cancer   . Family history of ovarian cancer   . Family history of pancreatic cancer   . Family history of prostate cancer   . Family history of stomach cancer   . Hair loss   . History of left breast cancer 05/29/14  . History of partial hysterectomy 12/31/2016   Per patient.  Has not had a period in years.  Had a partial hysterectomy years ago.  Marland Kitchen  Obesity   . Pancreatitis 1997  . Stroke [facility]) 2010   with mild left arm weakness    PAST SURGICAL HISTORY :   Past Surgical History:  Procedure Laterality Date  . CESAREAN SECTION    . CHOLECYSTECTOMY    . PARTIAL HYSTERECTOMY  12/31/2016   Per patient, she has not had a  period in years since she had a partial hysterectomy.    FAMILY HISTORY :   Family History  Problem Relation Age of Onset  . Ovarian cancer Mother 71  . Diabetes Mother   . Hypertension Mother   . COPD Father   . Hypertension Father   . Colon cancer Father 42  . Diabetes Sister   . Breast cancer Sister 65       bilateral  . Diabetes Brother   . Leukemia Maternal Aunt   . Pancreatic cancer Paternal Aunt 3  . Pancreatic cancer Paternal Uncle   . Colon cancer Paternal Uncle   . Stomach cancer Maternal Grandfather 58  . Throat cancer Paternal Grandmother   . Breast cancer Maternal Aunt 80  . Colon cancer Maternal Aunt   . Bone cancer Maternal Aunt   . Breast cancer Paternal Aunt        dx >50  . Prostate cancer Paternal Uncle   . Pancreatic cancer Paternal Uncle   . Throat cancer Paternal Uncle   . Lung cancer Paternal Uncle   . Stomach cancer Paternal Uncle   . Brain cancer Paternal Aunt   . Cancer Cousin        liver, kidney  . Prostate cancer Cousin        meastatic  . Lung cancer Other     SOCIAL HISTORY:   Social History   Tobacco Use  . Smoking status: Former Smoker    Packs/day: 0.50    Years: 1.00    Pack years: 0.50    Types: Cigarettes  . Smokeless tobacco: Never Used  Substance Use Topics  . Alcohol use: No    Alcohol/week: 0.0 standard drinks  . Drug use: No    ALLERGIES:  has No Known Allergies.  MEDICATIONS:  Current Outpatient Medications  Medication Sig Dispense Refill  . acetaminophen-codeine (TYLENOL #4) 300-60 MG tablet Take 2 tablets by mouth every 6 (six) hours as needed for moderate pain.     Marland Kitchen albuterol (PROAIR HFA) 108 (90 BASE) MCG/ACT inhaler Inhale 1 puff into the lungs every 6 (six) hours as needed for shortness of breath.     Marland Kitchen albuterol (PROVENTIL) (2.5 MG/3ML) 0.083% nebulizer solution Inhale 2.5 mg into the lungs every 6 (six) hours as needed for shortness of breath.     . ALPRAZolam (XANAX) 0.5 MG tablet Take 0.5 mg by  mouth at bedtime as needed for anxiety or sleep.     Marland Kitchen amLODipine (NORVASC) 10 MG tablet Take 10 mg by mouth daily.     Marland Kitchen aspirin EC 81 MG tablet Take 81 mg by mouth once.     Marland Kitchen atenolol (TENORMIN) 100 MG tablet TAKE 1 TABLETS BY MOUTH TWICE DAILY    . B-D ULTRA-FINE 33 LANCETS MISC Use 1 each 2 (two) times daily.    . B-D ULTRAFINE III SHORT PEN 31G X 8 MM MISC     . bumetanide (BUMEX) 0.5 MG tablet TAKE 1 TABLET BY MOUTH TWICE DAILY    . calcitRIOL (ROCALTROL) 0.25 MCG capsule Take 0.25 mcg by mouth 3 (three) times a week.    Marland Kitchen  Cinnamon 500 MG capsule Take 500 mg by mouth daily.     . cloNIDine (CATAPRES) 0.2 MG tablet Take 0.2 mg by mouth 2 (two) times daily.     . enalapril (VASOTEC) 20 MG tablet Take 20 mg by mouth 2 (two) times daily.     . ferrous sulfate 325 (65 FE) MG tablet Take 325 mg by mouth 2 (two) times daily with a meal.    . FLUoxetine (PROZAC) 20 MG capsule Take 20 mg by mouth 2 (two) times daily.     Marland Kitchen gabapentin (NEURONTIN) 100 MG capsule Take 1 capsule (100 mg total) by mouth at bedtime. 30 capsule 2  . glucose blood (ONE TOUCH ULTRA TEST) test strip Use 1 each 2 (two) times daily. Use as instructed.    . glyBURIDE (DIABETA) 5 MG tablet Take 5 mg by mouth daily with breakfast.     . LEVEMIR FLEXTOUCH 100 UNIT/ML Pen Inject 55 Units into the skin daily.     . metoprolol tartrate (LOPRESSOR) 25 MG tablet TAKE 1 TABLET BY MOUTH TWICE DAILY 60 tablet 0  . mometasone (NASONEX) 50 MCG/ACT nasal spray Place 2 sprays into the nose daily as needed.     Marland Kitchen NOVOLOG FLEXPEN 100 UNIT/ML FlexPen Inject 7 Units into the skin 2 (two) times daily at 8 am and 10 pm.     . salmeterol (SEREVENT) 50 MCG/DOSE diskus inhaler Inhale 1 puff into the lungs 2 (two) times daily.    . simvastatin (ZOCOR) 20 MG tablet Take 20 mg by mouth daily at 6 PM.     . vitamin B-12 (CYANOCOBALAMIN) 1000 MCG tablet Take 1,000 mcg by mouth daily.    . ondansetron (ZOFRAN) 8 MG tablet One pill every 8 hours as needed  for nausea/vomitting. (Patient not taking: Reported on 05/28/2018) 40 tablet 1  . prochlorperazine (COMPAZINE) 10 MG tablet Take 1 tablet (10 mg total) by mouth every 6 (six) hours as needed for nausea or vomiting. Please note change in strength (Patient not taking: Reported on 05/28/2018) 60 tablet 4   No current facility-administered medications for this visit.    Facility-Administered Medications Ordered in Other Visits  Medication Dose Route Frequency Provider Last Rate Last Dose  . sodium chloride flush (NS) 0.9 % injection 10 mL  10 mL Intravenous PRN Cammie Sickle, MD   10 mL at 01/30/16 1054    PHYSICAL EXAMINATION: ECOG PERFORMANCE STATUS: 1 - Symptomatic but completely ambulatory  BP (!) 171/94 (BP Location: Right Arm, Patient Position: Sitting)   Pulse 80   Temp (!) 96.1 F (35.6 C) (Tympanic)   Resp 18   Wt 206 lb 9.6 oz (93.7 kg)   BMI 37.79 kg/m   Filed Weights   05/28/18 0828  Weight: 206 lb 9.6 oz (93.7 kg)    Physical Exam  Constitutional: She is oriented to person, place, and time and well-developed, well-nourished, and in no distress.  She is alone.  HENT:  Head: Normocephalic and atraumatic.  Mouth/Throat: Oropharynx is clear and moist. No oropharyngeal exudate.  Eyes: Pupils are equal, round, and reactive to light.  Neck: Normal range of motion. Neck supple.  Cardiovascular: Normal rate and regular rhythm.  Pulmonary/Chest: No respiratory distress. She has no wheezes.  Abdominal: Soft. Bowel sounds are normal. She exhibits no distension and no mass. There is no tenderness. There is no rebound and no guarding.  Musculoskeletal: Normal range of motion. She exhibits no tenderness.  Neurological: She is alert and oriented  to person, place, and time.  Skin: Skin is warm.  Psychiatric: Affect normal.       LABORATORY DATA:  I have reviewed the data as listed    Component Value Date/Time   NA 139 05/28/2018 0809   NA 130 (L) 06/06/2014 1102    K 4.1 05/28/2018 0809   K 3.9 06/06/2014 1102   CL 107 05/28/2018 0809   CL 95 (L) 06/06/2014 1102   CO2 24 05/28/2018 0809   CO2 28 06/06/2014 1102   GLUCOSE 64 (L) 05/28/2018 0809   GLUCOSE 349 (H) 06/06/2014 1102   BUN 47 (H) 05/28/2018 0809   BUN 17 06/06/2014 1102   CREATININE 3.17 (H) 05/28/2018 0809   CREATININE 1.63 (H) 06/06/2014 1102   CALCIUM 9.0 05/28/2018 0809   CALCIUM 9.2 06/06/2014 1102   PROT 7.5 05/28/2018 0809   PROT 8.2 06/06/2014 1102   ALBUMIN 3.9 05/28/2018 0809   ALBUMIN 3.3 (L) 06/06/2014 1102   AST 18 05/28/2018 0809   AST 7 (L) 06/06/2014 1102   ALT 16 05/28/2018 0809   ALT 12 (L) 06/06/2014 1102   ALKPHOS 67 05/28/2018 0809   ALKPHOS 74 06/06/2014 1102   BILITOT 0.7 05/28/2018 0809   BILITOT 0.4 06/06/2014 1102   GFRNONAA 15 (L) 05/28/2018 0809   GFRNONAA 35 (L) 06/06/2014 1102   GFRAA 17 (L) 05/28/2018 0809   GFRAA 42 (L) 06/06/2014 1102    No results found for: SPEP, UPEP  Lab Results  Component Value Date   WBC 8.6 05/28/2018   NEUTROABS 5.9 05/28/2018   HGB 9.8 (L) 05/28/2018   HCT 29.2 (L) 05/28/2018   MCV 90.7 05/28/2018   PLT 313 05/28/2018      Chemistry      Component Value Date/Time   NA 139 05/28/2018 0809   NA 130 (L) 06/06/2014 1102   K 4.1 05/28/2018 0809   K 3.9 06/06/2014 1102   CL 107 05/28/2018 0809   CL 95 (L) 06/06/2014 1102   CO2 24 05/28/2018 0809   CO2 28 06/06/2014 1102   BUN 47 (H) 05/28/2018 0809   BUN 17 06/06/2014 1102   CREATININE 3.17 (H) 05/28/2018 0809   CREATININE 1.63 (H) 06/06/2014 1102      Component Value Date/Time   CALCIUM 9.0 05/28/2018 0809   CALCIUM 9.2 06/06/2014 1102   ALKPHOS 67 05/28/2018 0809   ALKPHOS 74 06/06/2014 1102   AST 18 05/28/2018 0809   AST 7 (L) 06/06/2014 1102   ALT 16 05/28/2018 0809   ALT 12 (L) 06/06/2014 1102   BILITOT 0.7 05/28/2018 0809   BILITOT 0.4 06/06/2014 1102       RADIOGRAPHIC STUDIES: I have personally reviewed the radiological images as  listed and agreed with the findings in the report. No results found.   ASSESSMENT & PLAN:  Carcinoma of upper-inner quadrant of left breast in female, estrogen receptor positive ([place]) Left breast cancer- stage IV- ER/PR +, HER-2/neu - on Taxol chemotherapy;  July 2019/AUG 2019- PET- overall STABLE; RLL nodule- clinically rounded atelectasis. STABLE.   # Continue Taxol 37m/m2; Labs today reviewed;  Acceptable for treatment today. Will order PET scan today.   #Right lower lobe nodular mass pleural-based-likely atelectasis less likely progressive malignancy; no significant uptake noted on PET scan.STABLE.   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   # Bil LE swelling/ededma MILD- discussed re: compression stocking/ leg improved.   # Chronic kidney disease -  stage IV- creatinine 2.85; STABLE.  # ? Genetic predisposition- s/p Genetic counseling [8/29]- awaiting results.   # Type 2 DM- insulin dependent/brtiile- sugars- 65 recommend orange juice/crackers.   # Anemia- hemoglobin today-9-10; STABLE; on PO iron.   #  DISPOSITION:  # Treatment today; add ca-27-29 today # 1 week- cbc/bmp/taxol # follow up in 2 weeks/labs/MD-cbc/cmp-ca--/Taxol-Dr.B   Orders Placed This Encounter  Procedures  . NM PET Image Restag (PS) Skull Base To Thigh    Standing Status:   Future    Standing Expiration Date:   05/28/2019    Order Specific Question:   ** REASON FOR EXAM (FREE TEXT)    Answer:   breast cancer    Order Specific Question:   If indicated for the ordered procedure, I authorize the administration of a radiopharmaceutical per Radiology protocol    Answer:   Yes    Order Specific Question:   Preferred imaging location?    Answer:   [facility]    Order Specific Question:   Radiology Contrast Protocol - do NOT remove file path    Answer:   [email or web address]  . Cancer antigen 27.29    Standing Status:    Future    Number of Occurrences:   1    Standing Expiration Date:   05/29/2019   All questions were answered. The patient knows to call the clinic with any problems, questions or concerns.      Cammie Sickle, MD 06/02/2018 4:18 PM

## 2018-05-29 LAB — CANCER ANTIGEN 27.29: CAN 27.29: 58.6 U/mL — AB (ref 0.0–38.6)

## 2018-06-04 ENCOUNTER — Inpatient Hospital Stay: Payer: Medicare Other | Attending: Internal Medicine

## 2018-06-04 ENCOUNTER — Inpatient Hospital Stay: Payer: Medicare Other

## 2018-06-04 ENCOUNTER — Telehealth: Payer: Self-pay | Admitting: Genetics

## 2018-06-04 VITALS — BP 136/82 | HR 71 | Temp 96.0°F | Resp 19 | Wt 207.0 lb

## 2018-06-04 DIAGNOSIS — G629 Polyneuropathy, unspecified: Secondary | ICD-10-CM | POA: Insufficient documentation

## 2018-06-04 DIAGNOSIS — R911 Solitary pulmonary nodule: Secondary | ICD-10-CM | POA: Insufficient documentation

## 2018-06-04 DIAGNOSIS — R9389 Abnormal findings on diagnostic imaging of other specified body structures: Secondary | ICD-10-CM | POA: Insufficient documentation

## 2018-06-04 DIAGNOSIS — Z79899 Other long term (current) drug therapy: Secondary | ICD-10-CM | POA: Insufficient documentation

## 2018-06-04 DIAGNOSIS — Z170 Estrogen receptor positive status [ER+]: Principal | ICD-10-CM

## 2018-06-04 DIAGNOSIS — Z5111 Encounter for antineoplastic chemotherapy: Secondary | ICD-10-CM | POA: Diagnosis not present

## 2018-06-04 DIAGNOSIS — C50212 Malignant neoplasm of upper-inner quadrant of left female breast: Secondary | ICD-10-CM

## 2018-06-04 DIAGNOSIS — M7989 Other specified soft tissue disorders: Secondary | ICD-10-CM | POA: Insufficient documentation

## 2018-06-04 DIAGNOSIS — D649 Anemia, unspecified: Secondary | ICD-10-CM | POA: Insufficient documentation

## 2018-06-04 DIAGNOSIS — E1122 Type 2 diabetes mellitus with diabetic chronic kidney disease: Secondary | ICD-10-CM | POA: Diagnosis not present

## 2018-06-04 DIAGNOSIS — Z79810 Long term (current) use of selective estrogen receptor modulators (SERMs): Secondary | ICD-10-CM | POA: Insufficient documentation

## 2018-06-04 DIAGNOSIS — Z794 Long term (current) use of insulin: Secondary | ICD-10-CM | POA: Diagnosis not present

## 2018-06-04 LAB — COMPREHENSIVE METABOLIC PANEL
ALT: 18 U/L (ref 0–44)
ANION GAP: 8 (ref 5–15)
AST: 17 U/L (ref 15–41)
Albumin: 3.7 g/dL (ref 3.5–5.0)
Alkaline Phosphatase: 59 U/L (ref 38–126)
BUN: 41 mg/dL — ABNORMAL HIGH (ref 8–23)
CHLORIDE: 106 mmol/L (ref 98–111)
CO2: 22 mmol/L (ref 22–32)
CREATININE: 2.89 mg/dL — AB (ref 0.44–1.00)
Calcium: 8.3 mg/dL — ABNORMAL LOW (ref 8.9–10.3)
GFR calc Af Amer: 19 mL/min — ABNORMAL LOW (ref >60)
GFR, EST NON AFRICAN AMERICAN: 17 mL/min — AB (ref >60)
Glucose, Bld: 106 mg/dL — ABNORMAL HIGH (ref 70–99)
Potassium: 3.9 mmol/L (ref 3.5–5.1)
SODIUM: 136 mmol/L (ref 135–145)
Total Bilirubin: 0.6 mg/dL (ref 0.3–1.2)
Total Protein: 7 g/dL (ref 6.5–8.1)

## 2018-06-04 LAB — CBC WITH DIFFERENTIAL/PLATELET
Abs Immature Granulocytes: 0.1 10*3/uL — ABNORMAL HIGH (ref 0.00–0.07)
BASOS ABS: 0 10*3/uL (ref 0.0–0.1)
Basophils Relative: 1 %
EOS PCT: 1 %
Eosinophils Absolute: 0.1 10*3/uL (ref 0.0–0.5)
HCT: 28.5 % — ABNORMAL LOW (ref 36.0–46.0)
HEMOGLOBIN: 9.5 g/dL — AB (ref 12.0–15.0)
IMMATURE GRANULOCYTES: 2 %
LYMPHS PCT: 21 %
Lymphs Abs: 1.2 10*3/uL (ref 0.7–4.0)
MCH: 30.5 pg (ref 26.0–34.0)
MCHC: 33.3 g/dL (ref 30.0–36.0)
MCV: 91.6 fL (ref 80.0–100.0)
Monocytes Absolute: 0.6 10*3/uL (ref 0.1–1.0)
Monocytes Relative: 10 %
NRBC: 0 % (ref 0.0–0.2)
Neutro Abs: 3.9 10*3/uL (ref 1.7–7.7)
Neutrophils Relative %: 65 %
Platelets: 271 10*3/uL (ref 150–400)
RBC: 3.11 MIL/uL — AB (ref 3.87–5.11)
RDW: 12.7 % (ref 11.5–15.5)
WBC: 5.8 10*3/uL (ref 4.0–10.5)

## 2018-06-04 MED ORDER — DEXAMETHASONE SODIUM PHOSPHATE 10 MG/ML IJ SOLN
8.0000 mg | Freq: Once | INTRAMUSCULAR | Status: AC
  Administered 2018-06-04: 8 mg via INTRAVENOUS
  Filled 2018-06-04: qty 1

## 2018-06-04 MED ORDER — DIPHENHYDRAMINE HCL 50 MG/ML IJ SOLN
50.0000 mg | Freq: Once | INTRAMUSCULAR | Status: AC
  Administered 2018-06-04: 50 mg via INTRAVENOUS
  Filled 2018-06-04: qty 1

## 2018-06-04 MED ORDER — SODIUM CHLORIDE 0.9 % IV SOLN
Freq: Once | INTRAVENOUS | Status: AC
  Administered 2018-06-04: 09:00:00 via INTRAVENOUS
  Filled 2018-06-04: qty 250

## 2018-06-04 MED ORDER — HEPARIN SOD (PORK) LOCK FLUSH 100 UNIT/ML IV SOLN
500.0000 [IU] | Freq: Once | INTRAVENOUS | Status: AC
  Administered 2018-06-04: 500 [IU] via INTRAVENOUS
  Filled 2018-06-04: qty 5

## 2018-06-04 MED ORDER — SODIUM CHLORIDE 0.9% FLUSH
10.0000 mL | INTRAVENOUS | Status: DC | PRN
  Administered 2018-06-04: 10 mL via INTRAVENOUS
  Filled 2018-06-04: qty 10

## 2018-06-04 MED ORDER — HEPARIN SOD (PORK) LOCK FLUSH 100 UNIT/ML IV SOLN: 500.0000 [IU] | Freq: Once | INTRAVENOUS | Status: DC | PRN

## 2018-06-04 MED ORDER — FAMOTIDINE IN NACL 20-0.9 MG/50ML-% IV SOLN
20.0000 mg | Freq: Once | INTRAVENOUS | Status: AC
  Administered 2018-06-04: 20 mg via INTRAVENOUS
  Filled 2018-06-04: qty 50

## 2018-06-04 MED ORDER — SODIUM CHLORIDE 0.9 % IV SOLN
60.0000 mg/m2 | Freq: Once | INTRAVENOUS | Status: AC
  Administered 2018-06-04: 114 mg via INTRAVENOUS
  Filled 2018-06-04: qty 19

## 2018-06-04 NOTE — Telephone Encounter (Signed)
3rd attempt:  asked pt to call back to discuss genetic test results

## 2018-06-07 ENCOUNTER — Ambulatory Visit
Admission: RE | Admit: 2018-06-07 | Discharge: 2018-06-07 | Disposition: A | Discharge status: Home / Self Care | Payer: Medicare Other | Source: Ambulatory Visit | Attending: Internal Medicine | Admitting: Internal Medicine

## 2018-06-07 DIAGNOSIS — Z170 Estrogen receptor positive status [ER+]: Secondary | ICD-10-CM | POA: Insufficient documentation

## 2018-06-07 DIAGNOSIS — C50212 Malignant neoplasm of upper-inner quadrant of left female breast: Secondary | ICD-10-CM | POA: Diagnosis not present

## 2018-06-07 DIAGNOSIS — I700 Atherosclerosis of aorta: Secondary | ICD-10-CM | POA: Insufficient documentation

## 2018-06-07 DIAGNOSIS — Z5111 Encounter for antineoplastic chemotherapy: Secondary | ICD-10-CM | POA: Diagnosis not present

## 2018-06-07 DIAGNOSIS — J90 Pleural effusion, not elsewhere classified: Secondary | ICD-10-CM | POA: Insufficient documentation

## 2018-06-07 DIAGNOSIS — C50912 Malignant neoplasm of unspecified site of left female breast: Secondary | ICD-10-CM | POA: Diagnosis not present

## 2018-06-07 LAB — GLUCOSE, CAPILLARY: Glucose-Capillary: 86 mg/dL (ref 70–99)

## 2018-06-07 MED ORDER — FLUDEOXYGLUCOSE F - 18 (FDG) INJECTION
10.7000 | Freq: Once | INTRAVENOUS | Status: AC | PRN
  Administered 2018-06-07: 11.47 via INTRAVENOUS

## 2018-06-11 ENCOUNTER — Inpatient Hospital Stay: Payer: Medicare Other

## 2018-06-11 ENCOUNTER — Other Ambulatory Visit: Payer: Self-pay

## 2018-06-11 ENCOUNTER — Inpatient Hospital Stay (HOSPITAL_BASED_OUTPATIENT_CLINIC_OR_DEPARTMENT_OTHER): Payer: Medicare Other | Admitting: Internal Medicine

## 2018-06-11 ENCOUNTER — Encounter: Payer: Self-pay | Admitting: Internal Medicine

## 2018-06-11 VITALS — BP 120/79 | HR 70 | Temp 98.5°F | Resp 16 | Ht 62.0 in | Wt 210.6 lb

## 2018-06-11 DIAGNOSIS — D649 Anemia, unspecified: Secondary | ICD-10-CM | POA: Diagnosis not present

## 2018-06-11 DIAGNOSIS — Z170 Estrogen receptor positive status [ER+]: Secondary | ICD-10-CM

## 2018-06-11 DIAGNOSIS — R911 Solitary pulmonary nodule: Secondary | ICD-10-CM

## 2018-06-11 DIAGNOSIS — Z5111 Encounter for antineoplastic chemotherapy: Secondary | ICD-10-CM | POA: Diagnosis not present

## 2018-06-11 DIAGNOSIS — C50212 Malignant neoplasm of upper-inner quadrant of left female breast: Secondary | ICD-10-CM | POA: Diagnosis not present

## 2018-06-11 DIAGNOSIS — E1122 Type 2 diabetes mellitus with diabetic chronic kidney disease: Secondary | ICD-10-CM

## 2018-06-11 DIAGNOSIS — Z79810 Long term (current) use of selective estrogen receptor modulators (SERMs): Secondary | ICD-10-CM

## 2018-06-11 DIAGNOSIS — G629 Polyneuropathy, unspecified: Secondary | ICD-10-CM

## 2018-06-11 DIAGNOSIS — M7989 Other specified soft tissue disorders: Secondary | ICD-10-CM | POA: Diagnosis not present

## 2018-06-11 DIAGNOSIS — Z794 Long term (current) use of insulin: Secondary | ICD-10-CM | POA: Diagnosis not present

## 2018-06-11 DIAGNOSIS — R9389 Abnormal findings on diagnostic imaging of other specified body structures: Secondary | ICD-10-CM | POA: Diagnosis not present

## 2018-06-11 LAB — CBC WITH DIFFERENTIAL/PLATELET
Abs Immature Granulocytes: 0.1 10*3/uL — ABNORMAL HIGH (ref 0.00–0.07)
BASOS PCT: 0 %
Basophils Absolute: 0 10*3/uL (ref 0.0–0.1)
Eosinophils Absolute: 0.1 10*3/uL (ref 0.0–0.5)
Eosinophils Relative: 1 %
HCT: 28 % — ABNORMAL LOW (ref 36.0–46.0)
HEMOGLOBIN: 9.4 g/dL — AB (ref 12.0–15.0)
Immature Granulocytes: 1 %
LYMPHS PCT: 13 %
Lymphs Abs: 1 10*3/uL (ref 0.7–4.0)
MCH: 30.6 pg (ref 26.0–34.0)
MCHC: 33.6 g/dL (ref 30.0–36.0)
MCV: 91.2 fL (ref 80.0–100.0)
MONO ABS: 0.6 10*3/uL (ref 0.1–1.0)
Monocytes Relative: 7 %
Neutro Abs: 5.8 10*3/uL (ref 1.7–7.7)
Neutrophils Relative %: 78 %
Platelets: 317 10*3/uL (ref 150–400)
RBC: 3.07 MIL/uL — AB (ref 3.87–5.11)
RDW: 13.2 % (ref 11.5–15.5)
WBC: 7.6 10*3/uL (ref 4.0–10.5)
nRBC: 0 % (ref 0.0–0.2)

## 2018-06-11 LAB — COMPREHENSIVE METABOLIC PANEL
ALBUMIN: 3.8 g/dL (ref 3.5–5.0)
ALT: 18 U/L (ref 0–44)
AST: 16 U/L (ref 15–41)
Alkaline Phosphatase: 58 U/L (ref 38–126)
Anion gap: 7 (ref 5–15)
BUN: 40 mg/dL — ABNORMAL HIGH (ref 8–23)
CHLORIDE: 107 mmol/L (ref 98–111)
CO2: 23 mmol/L (ref 22–32)
Calcium: 7.9 mg/dL — ABNORMAL LOW (ref 8.9–10.3)
Creatinine, Ser: 3.12 mg/dL — ABNORMAL HIGH (ref 0.44–1.00)
GFR calc non Af Amer: 15 mL/min — ABNORMAL LOW (ref >60)
GFR, EST AFRICAN AMERICAN: 17 mL/min — AB (ref >60)
Glucose, Bld: 90 mg/dL (ref 70–99)
POTASSIUM: 4.3 mmol/L (ref 3.5–5.1)
SODIUM: 137 mmol/L (ref 135–145)
Total Bilirubin: 0.5 mg/dL (ref 0.3–1.2)
Total Protein: 7 g/dL (ref 6.5–8.1)

## 2018-06-11 MED ORDER — SODIUM CHLORIDE 0.9 % IV SOLN
60.0000 mg/m2 | Freq: Once | INTRAVENOUS | Status: AC
  Administered 2018-06-11: 114 mg via INTRAVENOUS
  Filled 2018-06-11: qty 19

## 2018-06-11 MED ORDER — FAMOTIDINE IN NACL 20-0.9 MG/50ML-% IV SOLN
20.0000 mg | Freq: Once | INTRAVENOUS | Status: AC
  Administered 2018-06-11: 20 mg via INTRAVENOUS
  Filled 2018-06-11: qty 50

## 2018-06-11 MED ORDER — DEXAMETHASONE SODIUM PHOSPHATE 10 MG/ML IJ SOLN
8.0000 mg | Freq: Once | INTRAMUSCULAR | Status: AC
  Administered 2018-06-11: 8 mg via INTRAVENOUS
  Filled 2018-06-11: qty 1

## 2018-06-11 MED ORDER — DIPHENHYDRAMINE HCL 50 MG/ML IJ SOLN
50.0000 mg | Freq: Once | INTRAMUSCULAR | Status: AC
  Administered 2018-06-11: 50 mg via INTRAVENOUS
  Filled 2018-06-11: qty 1

## 2018-06-11 MED ORDER — SODIUM CHLORIDE 0.9 % IV SOLN
Freq: Once | INTRAVENOUS | Status: AC
  Administered 2018-06-11: 10:00:00 via INTRAVENOUS
  Filled 2018-06-11: qty 250

## 2018-06-11 MED ORDER — HEPARIN SOD (PORK) LOCK FLUSH 100 UNIT/ML IV SOLN
500.0000 [IU] | Freq: Once | INTRAVENOUS | Status: AC | PRN
  Administered 2018-06-11: 500 [IU]
  Filled 2018-06-11: qty 5

## 2018-06-11 NOTE — Progress Notes (Signed)
[place] OFFICE PROGRESS NOTE  Patient Care Team: [name], [name], [name]; [name]; [name]; [name]; EBUS- Positive Paratrac/subcarinal LN s/p ? Taxotere [in [place]; [name]; [name];[name]; [name]; suv 3]; [name]; [name]; [name];  [name]; Ant R iliac bone; [name]; [name]; [name]; [name]; [name]; mild left sided weakness]  # MOLECULAR TESTING: NA   #  ------------------------------------------------   DIAGNOSIS: [ 8144] BREAST CA; [name]; [name]    ;[name], estrogen receptor positive ([place])      INTERVAL HISTORY:  [name] 62 y.o.  female pleasant patient above history of ER PR positive HER-2 negative breast cancer on Taxol is here for follow-up/review this to the PET scan.  Patient denies any worsening tingling and numbness in extremities.  Denies any nausea vomiting.  Denies any fevers or chills.  Denies any abdominal pain.  Review of Systems   Constitutional: Positive for malaise/fatigue. Negative for chills, diaphoresis, [name], hemoptysis, sputum production, [name], palpitations and orthopnea.  Gastrointestinal: Negative for abdominal pain, blood in stool, constipation, diarrhea, heartburn, melena, nausea and vomiting.  Genitourinary: Negative for dysuria, frequency and urgency.  Musculoskeletal: Negative for back pain and joint pain.  Skin: Negative.  Negative for itching and rash.  Neurological: Positive for tingling. Negative for dizziness, focal weakness, weakness and headaches.  Endo/Heme/Allergies: Does not bruise/bleed easily.  Psychiatric/Behavioral: Negative for depression. The patient is not nervous/anxious and does not have insomnia.       PAST MEDICAL HISTORY :  Past Medical History:  Diagnosis Date  . Asthma   . Cancer ([place]) 03/10/2018   Per [facility] PET order. Carcinoma of upper-inner quadrant of left breast in female, estrogen receptor positive .  Marland Kitchen CHF (congestive heart failure) ([place]) 1997  . CKD (chronic kidney disease)   . Depression   . Diabetes mellitus, type 2 ([place])   . Family history of breast cancer   . Family history of colon cancer   . Family history of ovarian cancer   . Family history of pancreatic cancer   . Family history of prostate cancer   . Family history of stomach cancer   . Hair loss   . History of left breast cancer 05/29/14  . History of partial hysterectomy 12/31/2016   Per patient.  Has not had a period in years.  Had a partial hysterectomy years ago.  . Obesity   . Pancreatitis 1997  . Stroke [facility]) 2010   with mild left arm weakness    PAST SURGICAL HISTORY :   Past Surgical History:  Procedure Laterality Date  . CESAREAN SECTION    . CHOLECYSTECTOMY    . PARTIAL  HYSTERECTOMY  12/31/2016   Per patient, she has not had a period in years since she had a partial hysterectomy.    FAMILY HISTORY :   Family History  Problem Relation Age of Onset  . Ovarian cancer Mother 31  . Diabetes Mother   . Hypertension Mother   . COPD Father   . Hypertension Father   . Colon cancer Father 9  . Diabetes Sister   . Breast cancer Sister 27       bilateral  . Diabetes Brother   . Leukemia Maternal Aunt   . Pancreatic cancer Paternal Aunt 36  . Pancreatic cancer Paternal Uncle   . Colon cancer Paternal Uncle   . Stomach cancer Maternal Grandfather 18  . Throat cancer Paternal Grandmother   . Breast cancer Maternal Aunt 80  . Colon cancer Maternal Aunt   . Bone cancer Maternal Aunt   . Breast cancer Paternal Aunt        dx >50  . Prostate cancer Paternal Uncle   . Pancreatic cancer Paternal Uncle   . Throat cancer Paternal Uncle   . Lung cancer Paternal Uncle   . Stomach cancer Paternal Uncle   . Brain cancer Paternal Aunt   . Cancer Cousin        liver, kidney  . Prostate cancer Cousin        meastatic  . Lung cancer Other     SOCIAL HISTORY:   Social History   Tobacco Use  . Smoking status: Former Smoker    Packs/day: 0.50    Years: 1.00    Pack years: 0.50    Types: Cigarettes  . Smokeless tobacco: Never Used  Substance Use Topics  . Alcohol use: No    Alcohol/week: 0.0 standard drinks  . Drug use: No    ALLERGIES:  has No Known Allergies.  MEDICATIONS:  Current Outpatient Medications  Medication Sig Dispense Refill  . acetaminophen-codeine (TYLENOL #4) 300-60 MG tablet Take 2 tablets by mouth every 6 (six) hours as needed for moderate pain.     Marland Kitchen albuterol (PROAIR HFA) 108 (90 BASE) MCG/ACT inhaler Inhale 1 puff into the lungs every 6 (six) hours as needed for shortness of breath.     Marland Kitchen albuterol (PROVENTIL) (2.5 MG/3ML) 0.083% nebulizer solution Inhale 2.5 mg into the lungs every 6 (six) hours as needed for shortness of breath.      . ALPRAZolam (XANAX) 0.5 MG tablet Take 0.5 mg by mouth at bedtime as needed for anxiety or sleep.     Marland Kitchen amLODipine (NORVASC) 10 MG tablet Take 10 mg by mouth daily.     Marland Kitchen aspirin EC 81 MG tablet Take 81 mg by mouth once.     Marland Kitchen atenolol (TENORMIN) 100 MG tablet TAKE 1 TABLETS BY MOUTH TWICE DAILY    . B-D ULTRA-FINE 33 LANCETS MISC Use 1 each 2 (two) times daily.    . B-D ULTRAFINE III SHORT PEN 31G X 8 MM MISC     . bumetanide (BUMEX) 0.5 MG tablet TAKE 1 TABLET BY MOUTH TWICE DAILY    . calcitRIOL (ROCALTROL) 0.25 MCG capsule Take 0.25 mcg by mouth  3 (three) times a week.    . Cinnamon 500 MG capsule Take 500 mg by mouth daily.     . cloNIDine (CATAPRES) 0.2 MG tablet Take 0.2 mg by mouth 2 (two) times daily.     . enalapril (VASOTEC) 20 MG tablet Take 20 mg by mouth 2 (two) times daily.     . ferrous sulfate 325 (65 FE) MG tablet Take 325 mg by mouth 2 (two) times daily with a meal.    . FLUoxetine (PROZAC) 20 MG capsule Take 20 mg by mouth 2 (two) times daily.     Marland Kitchen gabapentin (NEURONTIN) 100 MG capsule Take 1 capsule (100 mg total) by mouth at bedtime. 30 capsule 2  . glucose blood (ONE TOUCH ULTRA TEST) test strip Use 1 each 2 (two) times daily. Use as instructed.    . glyBURIDE (DIABETA) 5 MG tablet Take 5 mg by mouth daily with breakfast.     . LEVEMIR FLEXTOUCH 100 UNIT/ML Pen Inject 55 Units into the skin daily.     . metoprolol tartrate (LOPRESSOR) 25 MG tablet TAKE 1 TABLET BY MOUTH TWICE DAILY 60 tablet 0  . mometasone (NASONEX) 50 MCG/ACT nasal spray Place 2 sprays into the nose daily as needed.     Marland Kitchen NOVOLOG FLEXPEN 100 UNIT/ML FlexPen Inject 7 Units into the skin 2 (two) times daily at 8 am and 10 pm.     . ondansetron (ZOFRAN) 8 MG tablet One pill every 8 hours as needed for nausea/vomitting. 40 tablet 1  . prochlorperazine (COMPAZINE) 10 MG tablet Take 1 tablet (10 mg total) by mouth every 6 (six) hours as needed for nausea or vomiting. Please note change in strength 60  tablet 4  . salmeterol (SEREVENT) 50 MCG/DOSE diskus inhaler Inhale 1 puff into the lungs 2 (two) times daily.    . simvastatin (ZOCOR) 20 MG tablet Take 20 mg by mouth daily at 6 PM.     . vitamin B-12 (CYANOCOBALAMIN) 1000 MCG tablet Take 1,000 mcg by mouth daily.     No current facility-administered medications for this visit.    Facility-Administered Medications Ordered in Other Visits  Medication Dose Route Frequency Provider Last Rate Last Dose  . heparin lock flush 100 unit/mL  500 Units Intracatheter Once PRN Cammie Sickle, MD      . PACLitaxel (TAXOL) 114 mg in sodium chloride 0.9 % 250 mL chemo infusion (</= 17m/m2)  60 mg/m2 (Treatment Plan Recorded) Intravenous Once BCammie Sickle MD 269 mL/hr at 06/11/18 1016 114 mg at 06/11/18 1016  . sodium chloride flush (NS) 0.9 % injection 10 mL  10 mL Intravenous PRN BCammie Sickle MD   10 mL at 01/30/16 1054    PHYSICAL EXAMINATION: ECOG PERFORMANCE STATUS: 1 - Symptomatic but completely ambulatory  BP 120/79 (BP Location: Right Arm, Patient Position: Sitting)   Pulse 70   Temp 98.5 F (36.9 C) (Tympanic)   Resp 16   Ht 5' 2"  (1.575 m)   Wt 210 lb 9.6 oz (95.5 kg)   BMI 38.52 kg/m   Filed Weights   06/11/18 0845  Weight: 210 lb 9.6 oz (95.5 kg)    Physical Exam  Constitutional: She is oriented to person, place, and time and well-developed, well-nourished, and in no distress.  She is alone.  HENT:  Head: Normocephalic and atraumatic.  Mouth/Throat: Oropharynx is clear and moist. No oropharyngeal exudate.  Eyes: Pupils are equal, round, and reactive to light.  Neck: Normal range  of motion. Neck supple.  Cardiovascular: Normal rate and regular rhythm.  Pulmonary/Chest: No respiratory distress. She has no wheezes.  Abdominal: Soft. Bowel sounds are normal. She exhibits no distension and no mass. There is no tenderness. There is no rebound and no guarding.  Musculoskeletal: Normal range of motion. She  exhibits no tenderness.  Neurological: She is alert and oriented to person, place, and time.  Skin: Skin is warm.  Psychiatric: Affect normal.       LABORATORY DATA:  I have reviewed the data as listed    Component Value Date/Time   NA 137 06/11/2018 0809   NA 130 (L) 06/06/2014 1102   K 4.3 06/11/2018 0809   K 3.9 06/06/2014 1102   CL 107 06/11/2018 0809   CL 95 (L) 06/06/2014 1102   CO2 23 06/11/2018 0809   CO2 28 06/06/2014 1102   GLUCOSE 90 06/11/2018 0809   GLUCOSE 349 (H) 06/06/2014 1102   BUN 40 (H) 06/11/2018 0809   BUN 17 06/06/2014 1102   CREATININE 3.12 (H) 06/11/2018 0809   CREATININE 1.63 (H) 06/06/2014 1102   CALCIUM 7.9 (L) 06/11/2018 0809   CALCIUM 9.2 06/06/2014 1102   PROT 7.0 06/11/2018 0809   PROT 8.2 06/06/2014 1102   ALBUMIN 3.8 06/11/2018 0809   ALBUMIN 3.3 (L) 06/06/2014 1102   AST 16 06/11/2018 0809   AST 7 (L) 06/06/2014 1102   ALT 18 06/11/2018 0809   ALT 12 (L) 06/06/2014 1102   ALKPHOS 58 06/11/2018 0809   ALKPHOS 74 06/06/2014 1102   BILITOT 0.5 06/11/2018 0809   BILITOT 0.4 06/06/2014 1102   GFRNONAA 15 (L) 06/11/2018 0809   GFRNONAA 35 (L) 06/06/2014 1102   GFRAA 17 (L) 06/11/2018 0809   GFRAA 42 (L) 06/06/2014 1102    No results found for: SPEP, UPEP  Lab Results  Component Value Date   WBC 7.6 06/11/2018   NEUTROABS 5.8 06/11/2018   HGB 9.4 (L) 06/11/2018   HCT 28.0 (L) 06/11/2018   MCV 91.2 06/11/2018   PLT 317 06/11/2018      Chemistry      Component Value Date/Time   NA 137 06/11/2018 0809   NA 130 (L) 06/06/2014 1102   K 4.3 06/11/2018 0809   K 3.9 06/06/2014 1102   CL 107 06/11/2018 0809   CL 95 (L) 06/06/2014 1102   CO2 23 06/11/2018 0809   CO2 28 06/06/2014 1102   BUN 40 (H) 06/11/2018 0809   BUN 17 06/06/2014 1102   CREATININE 3.12 (H) 06/11/2018 0809   CREATININE 1.63 (H) 06/06/2014 1102      Component Value Date/Time   CALCIUM 7.9 (L) 06/11/2018 0809   CALCIUM 9.2 06/06/2014 1102   ALKPHOS 58  06/11/2018 0809   ALKPHOS 74 06/06/2014 1102   AST 16 06/11/2018 0809   AST 7 (L) 06/06/2014 1102   ALT 18 06/11/2018 0809   ALT 12 (L) 06/06/2014 1102   BILITOT 0.5 06/11/2018 0809   BILITOT 0.4 06/06/2014 1102       RADIOGRAPHIC STUDIES: I have personally reviewed the radiological images as listed and agreed with the findings in the report. No results found.   ASSESSMENT & PLAN:  Carcinoma of upper-inner quadrant of left breast in female, estrogen receptor positive ([place]) Left breast cancer- stage IV- ER/PR +, HER-2/neu - on Taxol chemotherapy; NOV 4th 2019- PET- overall STABLE; RLL nodule- clinically rounded atelectasis/improved; but incidental pancreatic uptake [see discussion below]. STABLE.   # Continue Taxol 58m/m2;  Labs today reviewed;  Acceptable for treatment today.   # Right lower lobe nodular mass pleural-based-likely atelectasis- Improved.   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  Monitor for now.   # Bil LE swelling/ededma MILD- discussed re: compression stocking/ leg stable  # Chronic kidney disease - stage IV- creatinine 3.1 stable.  # ? Genetic predisposition- s/p Genetic counseling [8/29]- awaiting results.   # Type 2 DM- insulin dependent/-stable  # Anemia- hemoglobin today-9-10; stable on PO iron.   # I reviewed the blood work- with the patient in detail; also reviewed the imaging independently [as summarized above]; and with the patient in detail.   #  DISPOSITION:  # Treatment today; # follow up in 2 weeks/labs/MD-cbc/cmp-ca-27-29--/Taxol-Dr.B   Orders Placed This Encounter  Procedures  . CBC with Differential/Platelet    Standing Status:   Future    Standing Expiration Date:   06/12/2019  . Comprehensive metabolic panel    Standing Status:   Future    Standing Expiration Date:    06/12/2019  . CBC with Differential/Platelet    Standing Status:   Future    Standing Expiration Date:   06/12/2019  . Comprehensive metabolic panel    Standing Status:   Future    Standing Expiration Date:   06/12/2019  . Cancer antigen 27.29    Standing Status:   Future    Standing Expiration Date:   06/12/2019   All questions were answered. The patient knows to call the clinic with any problems, questions or concerns.      Cammie Sickle, MD 06/11/2018 10:25 AM

## 2018-06-11 NOTE — Assessment & Plan Note (Addendum)
Left breast cancer- stage IV- ER/PR +, HER-2/neu - on Taxol chemotherapy; NOV 4th 2019- PET- overall STABLE; RLL nodule- clinically rounded atelectasis/improved; but incidental pancreatic uptake [see discussion below]. STABLE.   # Continue Taxol 66m/m2; Labs today reviewed;  Acceptable for treatment today.   # Right lower lobe nodular mass pleural-based-likely atelectasis- Improved.   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  Monitor for now.   # Bil LE swelling/ededma MILD- discussed re: compression stocking/ leg stable  # Chronic kidney disease - stage IV- creatinine 3.1 stable.  # ? Genetic predisposition- s/p Genetic counseling [8/29]- awaiting results.   # Type 2 DM- insulin dependent/-stable  # Anemia- hemoglobin today-9-10; stable on PO iron.   # I reviewed the blood work- with the patient in detail; also reviewed the imaging independently [as summarized above]; and with the patient in detail.   #  DISPOSITION:  # Treatment today; # follow up in 2 weeks/labs/MD-cbc/cmp-ca-27-29--/Taxol-Dr.B

## 2018-06-18 ENCOUNTER — Inpatient Hospital Stay: Payer: Medicare Other

## 2018-06-18 ENCOUNTER — Other Ambulatory Visit: Payer: Self-pay | Admitting: Internal Medicine

## 2018-06-18 VITALS — BP 133/83 | HR 78 | Temp 95.4°F | Resp 18 | Wt 212.2 lb

## 2018-06-18 DIAGNOSIS — Z5111 Encounter for antineoplastic chemotherapy: Secondary | ICD-10-CM | POA: Diagnosis not present

## 2018-06-18 DIAGNOSIS — Z79810 Long term (current) use of selective estrogen receptor modulators (SERMs): Secondary | ICD-10-CM | POA: Diagnosis not present

## 2018-06-18 DIAGNOSIS — C50212 Malignant neoplasm of upper-inner quadrant of left female breast: Secondary | ICD-10-CM | POA: Diagnosis not present

## 2018-06-18 DIAGNOSIS — Z170 Estrogen receptor positive status [ER+]: Principal | ICD-10-CM

## 2018-06-18 DIAGNOSIS — R911 Solitary pulmonary nodule: Secondary | ICD-10-CM | POA: Diagnosis not present

## 2018-06-18 DIAGNOSIS — G629 Polyneuropathy, unspecified: Secondary | ICD-10-CM | POA: Diagnosis not present

## 2018-06-18 LAB — CBC WITH DIFFERENTIAL/PLATELET
Abs Immature Granulocytes: 0.18 10*3/uL — ABNORMAL HIGH (ref 0.00–0.07)
Basophils Absolute: 0 10*3/uL (ref 0.0–0.1)
Basophils Relative: 1 %
EOS ABS: 0 10*3/uL (ref 0.0–0.5)
EOS PCT: 1 %
HCT: 29.3 % — ABNORMAL LOW (ref 36.0–46.0)
HEMOGLOBIN: 9.7 g/dL — AB (ref 12.0–15.0)
Immature Granulocytes: 2 %
LYMPHS ABS: 0.8 10*3/uL (ref 0.7–4.0)
LYMPHS PCT: 11 %
MCH: 30.4 pg (ref 26.0–34.0)
MCHC: 33.1 g/dL (ref 30.0–36.0)
MCV: 91.8 fL (ref 80.0–100.0)
MONO ABS: 0.6 10*3/uL (ref 0.1–1.0)
Monocytes Relative: 8 %
Neutro Abs: 6 10*3/uL (ref 1.7–7.7)
Neutrophils Relative %: 77 %
Platelets: 304 10*3/uL (ref 150–400)
RBC: 3.19 MIL/uL — ABNORMAL LOW (ref 3.87–5.11)
RDW: 13.4 % (ref 11.5–15.5)
WBC: 7.7 10*3/uL (ref 4.0–10.5)
nRBC: 0 % (ref 0.0–0.2)

## 2018-06-18 LAB — COMPREHENSIVE METABOLIC PANEL
ALK PHOS: 63 U/L (ref 38–126)
ALT: 18 U/L (ref 0–44)
AST: 19 U/L (ref 15–41)
Albumin: 3.9 g/dL (ref 3.5–5.0)
Anion gap: 9 (ref 5–15)
BUN: 36 mg/dL — AB (ref 8–23)
CALCIUM: 8.4 mg/dL — AB (ref 8.9–10.3)
CHLORIDE: 106 mmol/L (ref 98–111)
CO2: 23 mmol/L (ref 22–32)
CREATININE: 2.85 mg/dL — AB (ref 0.44–1.00)
GFR calc non Af Amer: 17 mL/min — ABNORMAL LOW (ref >60)
GFR, EST AFRICAN AMERICAN: 19 mL/min — AB (ref >60)
Glucose, Bld: 84 mg/dL (ref 70–99)
Potassium: 4.6 mmol/L (ref 3.5–5.1)
SODIUM: 138 mmol/L (ref 135–145)
Total Bilirubin: 0.3 mg/dL (ref 0.3–1.2)
Total Protein: 7.1 g/dL (ref 6.5–8.1)

## 2018-06-18 MED ORDER — HEPARIN SOD (PORK) LOCK FLUSH 100 UNIT/ML IV SOLN
500.0000 [IU] | Freq: Once | INTRAVENOUS | Status: AC
  Administered 2018-06-18: 500 [IU] via INTRAVENOUS
  Filled 2018-06-18: qty 5

## 2018-06-18 MED ORDER — FAMOTIDINE IN NACL 20-0.9 MG/50ML-% IV SOLN: 20.0000 mg | Freq: Once | INTRAVENOUS | Status: DC

## 2018-06-18 MED ORDER — HEPARIN SOD (PORK) LOCK FLUSH 100 UNIT/ML IV SOLN: 500.0000 [IU] | Freq: Once | INTRAVENOUS | Status: DC | PRN

## 2018-06-18 MED ORDER — SODIUM CHLORIDE 0.9% FLUSH
10.0000 mL | Freq: Once | INTRAVENOUS | Status: AC
  Administered 2018-06-18: 10 mL via INTRAVENOUS
  Filled 2018-06-18: qty 10

## 2018-06-18 MED ORDER — DIPHENHYDRAMINE HCL 50 MG/ML IJ SOLN
50.0000 mg | Freq: Once | INTRAMUSCULAR | Status: AC
  Administered 2018-06-18: 50 mg via INTRAVENOUS
  Filled 2018-06-18: qty 1

## 2018-06-18 MED ORDER — SODIUM CHLORIDE 0.9 % IV SOLN
60.0000 mg/m2 | Freq: Once | INTRAVENOUS | Status: AC
  Administered 2018-06-18: 114 mg via INTRAVENOUS
  Filled 2018-06-18: qty 19

## 2018-06-18 MED ORDER — SODIUM CHLORIDE 0.9 % IV SOLN
Freq: Once | INTRAVENOUS | Status: AC
  Administered 2018-06-18: 11:00:00 via INTRAVENOUS
  Filled 2018-06-18: qty 50

## 2018-06-18 MED ORDER — DEXAMETHASONE SODIUM PHOSPHATE 10 MG/ML IJ SOLN
8.0000 mg | Freq: Once | INTRAMUSCULAR | Status: AC
  Administered 2018-06-18: 8 mg via INTRAVENOUS
  Filled 2018-06-18: qty 1

## 2018-06-18 MED ORDER — SODIUM CHLORIDE 0.9 % IV SOLN
Freq: Once | INTRAVENOUS | Status: AC
  Administered 2018-06-18: 11:00:00 via INTRAVENOUS
  Filled 2018-06-18: qty 250

## 2018-06-21 ENCOUNTER — Encounter: Payer: Self-pay | Admitting: Genetics

## 2018-06-21 ENCOUNTER — Telehealth: Payer: Self-pay | Admitting: Genetics

## 2018-06-21 NOTE — Telephone Encounter (Signed)
4th attempt: left message asking pt to call back to discuss genetic test results.    Will send letter notifying patient results are available and to call us to receive/discuss them.

## 2018-06-25 ENCOUNTER — Inpatient Hospital Stay: Payer: Medicare Other

## 2018-06-25 ENCOUNTER — Encounter: Payer: Self-pay | Admitting: Internal Medicine

## 2018-06-25 ENCOUNTER — Inpatient Hospital Stay (HOSPITAL_BASED_OUTPATIENT_CLINIC_OR_DEPARTMENT_OTHER): Payer: Medicare Other | Admitting: Internal Medicine

## 2018-06-25 VITALS — BP 132/82 | HR 76 | Temp 98.1°F | Resp 16 | Wt 210.6 lb

## 2018-06-25 DIAGNOSIS — Z170 Estrogen receptor positive status [ER+]: Secondary | ICD-10-CM | POA: Diagnosis not present

## 2018-06-25 DIAGNOSIS — M7989 Other specified soft tissue disorders: Secondary | ICD-10-CM | POA: Diagnosis not present

## 2018-06-25 DIAGNOSIS — D649 Anemia, unspecified: Secondary | ICD-10-CM | POA: Diagnosis not present

## 2018-06-25 DIAGNOSIS — Z5111 Encounter for antineoplastic chemotherapy: Secondary | ICD-10-CM | POA: Diagnosis not present

## 2018-06-25 DIAGNOSIS — G629 Polyneuropathy, unspecified: Secondary | ICD-10-CM

## 2018-06-25 DIAGNOSIS — R9389 Abnormal findings on diagnostic imaging of other specified body structures: Secondary | ICD-10-CM

## 2018-06-25 DIAGNOSIS — R911 Solitary pulmonary nodule: Secondary | ICD-10-CM

## 2018-06-25 DIAGNOSIS — C50212 Malignant neoplasm of upper-inner quadrant of left female breast: Secondary | ICD-10-CM | POA: Diagnosis not present

## 2018-06-25 DIAGNOSIS — Z79810 Long term (current) use of selective estrogen receptor modulators (SERMs): Secondary | ICD-10-CM

## 2018-06-25 DIAGNOSIS — Z794 Long term (current) use of insulin: Secondary | ICD-10-CM

## 2018-06-25 DIAGNOSIS — E1122 Type 2 diabetes mellitus with diabetic chronic kidney disease: Secondary | ICD-10-CM | POA: Diagnosis not present

## 2018-06-25 LAB — CBC WITH DIFFERENTIAL/PLATELET
ABS IMMATURE GRANULOCYTES: 0.21 10*3/uL — AB (ref 0.00–0.07)
BASOS ABS: 0.1 10*3/uL (ref 0.0–0.1)
Basophils Relative: 1 %
Eosinophils Absolute: 0.1 10*3/uL (ref 0.0–0.5)
Eosinophils Relative: 1 %
HCT: 28.5 % — ABNORMAL LOW (ref 36.0–46.0)
HEMOGLOBIN: 9.7 g/dL — AB (ref 12.0–15.0)
Immature Granulocytes: 3 %
Lymphocytes Relative: 13 %
Lymphs Abs: 0.9 10*3/uL (ref 0.7–4.0)
MCH: 31.2 pg (ref 26.0–34.0)
MCHC: 34 g/dL (ref 30.0–36.0)
MCV: 91.6 fL (ref 80.0–100.0)
MONO ABS: 0.6 10*3/uL (ref 0.1–1.0)
Monocytes Relative: 8 %
NEUTROS ABS: 5.3 10*3/uL (ref 1.7–7.7)
NRBC: 0.3 % — AB (ref 0.0–0.2)
Neutrophils Relative %: 74 %
Platelets: 333 10*3/uL (ref 150–400)
RBC: 3.11 MIL/uL — AB (ref 3.87–5.11)
RDW: 13.1 % (ref 11.5–15.5)
WBC: 7.2 10*3/uL (ref 4.0–10.5)

## 2018-06-25 LAB — COMPREHENSIVE METABOLIC PANEL
ALT: 17 U/L (ref 0–44)
AST: 21 U/L (ref 15–41)
Albumin: 3.8 g/dL (ref 3.5–5.0)
Alkaline Phosphatase: 62 U/L (ref 38–126)
Anion gap: 7 (ref 5–15)
BUN: 41 mg/dL — AB (ref 8–23)
CO2: 23 mmol/L (ref 22–32)
Calcium: 8.4 mg/dL — ABNORMAL LOW (ref 8.9–10.3)
Chloride: 106 mmol/L (ref 98–111)
Creatinine, Ser: 3.14 mg/dL — ABNORMAL HIGH (ref 0.44–1.00)
GFR calc Af Amer: 17 mL/min — ABNORMAL LOW (ref >60)
GFR, EST NON AFRICAN AMERICAN: 15 mL/min — AB (ref >60)
Glucose, Bld: 132 mg/dL — ABNORMAL HIGH (ref 70–99)
Potassium: 4 mmol/L (ref 3.5–5.1)
Sodium: 136 mmol/L (ref 135–145)
Total Bilirubin: 0.5 mg/dL (ref 0.3–1.2)
Total Protein: 6.7 g/dL (ref 6.5–8.1)

## 2018-06-25 MED ORDER — DIPHENHYDRAMINE HCL 50 MG/ML IJ SOLN
50.0000 mg | Freq: Once | INTRAMUSCULAR | Status: AC
  Administered 2018-06-25: 50 mg via INTRAVENOUS
  Filled 2018-06-25: qty 1

## 2018-06-25 MED ORDER — HEPARIN SOD (PORK) LOCK FLUSH 100 UNIT/ML IV SOLN
500.0000 [IU] | Freq: Once | INTRAVENOUS | Status: AC
  Administered 2018-06-25: 500 [IU] via INTRAVENOUS
  Filled 2018-06-25: qty 5

## 2018-06-25 MED ORDER — SODIUM CHLORIDE 0.9 % IV SOLN
Freq: Once | INTRAVENOUS | Status: AC
  Administered 2018-06-25: 10:00:00 via INTRAVENOUS
  Filled 2018-06-25: qty 250

## 2018-06-25 MED ORDER — DEXAMETHASONE SODIUM PHOSPHATE 10 MG/ML IJ SOLN
8.0000 mg | Freq: Once | INTRAMUSCULAR | Status: AC
  Administered 2018-06-25: 8 mg via INTRAVENOUS
  Filled 2018-06-25: qty 1

## 2018-06-25 MED ORDER — SODIUM CHLORIDE 0.9 % IV SOLN
60.0000 mg/m2 | Freq: Once | INTRAVENOUS | Status: AC
  Administered 2018-06-25: 114 mg via INTRAVENOUS
  Filled 2018-06-25: qty 19

## 2018-06-25 MED ORDER — SODIUM CHLORIDE 0.9% FLUSH
10.0000 mL | INTRAVENOUS | Status: DC | PRN
  Administered 2018-06-25: 10 mL via INTRAVENOUS
  Filled 2018-06-25: qty 10

## 2018-06-25 MED ORDER — FAMOTIDINE IN NACL 20-0.9 MG/50ML-% IV SOLN
20.0000 mg | Freq: Once | INTRAVENOUS | Status: AC
  Administered 2018-06-25: 20 mg via INTRAVENOUS
  Filled 2018-06-25: qty 50

## 2018-06-25 NOTE — Assessment & Plan Note (Addendum)
Left breast cancer- stage IV- ER/PR +, HER-2/neu - on Taxol chemotherapy; NOV 4th 2019- PET- overall STABLE; RLL nodule- clinically rounded atelectasis/improved; but incidental pancreatic uptake [see discussion below]. Stable.   # Continue Taxol 94m/m2; Labs today reviewed;  Acceptable for treatment today.   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   # Mild pancreatic uptake-new[ Hx of pancreatitis- in past/Hx of PEI]-clinically not concerning for pancreatitis.  Stable.   # Bil LE swelling/ededma MILD- discussed re: compression stocking/ leg- stable.   # Chronic kidney disease - stage IV- creatinine 3.1 stable.  # ? Genetic predisposition- s/p Genetic counseling [8/29]- awaiting results. Asked pt to call [facility]; [facility]   # Type 2 DM- insulin dependent/-STABLE.   # Anemia- hemoglobin today-9-10; STABLE.  on PO iron.   #  DISPOSITION:  # Treatment today; # follow up on dec 13th /labs/MD-cbc/cmp-ca-27-29--/Taxol-Dr.B

## 2018-06-25 NOTE — Progress Notes (Signed)
[name], [name], [name]; [name]; [name]; [name]; [name]; [name]; [name];[name]; [name]; suv 3]; [name]; [name]; [name];  [name]; [name]; [name]; [name]; [name]; [name]; [name]; [name]; [name]; [name]    ;[name], [name], diaphoresis, [name], hemoptysis, sputum production, [name], palpitations and orthopnea.  Gastrointestinal: Negative for abdominal pain, blood in stool, constipation, diarrhea, heartburn, melena, nausea and vomiting.  Genitourinary: Negative for dysuria, frequency and urgency.  Musculoskeletal: Negative for back pain and joint pain.  Skin: Negative.  Negative for itching and rash.  Neurological: Positive for tingling. Negative for dizziness, focal weakness, weakness and headaches.  Endo/Heme/Allergies: Does not bruise/bleed easily.  Psychiatric/Behavioral: Negative for depression. The patient is not nervous/anxious and does not have insomnia.       PAST MEDICAL HISTORY :  Past Medical History:  Diagnosis Date  . Asthma   . Cancer ([place]) 03/10/2018   Per [facility] PET order. Carcinoma of upper-inner quadrant of left breast in female, [name], type 2 ([place])   . Family history of breast cancer   . Family history of colon cancer   . Family history of ovarian cancer   . Family history of pancreatic cancer   . Family history of prostate cancer   . Family history of stomach cancer   . Hair loss   . History of left breast cancer 05/29/14  . History of partial hysterectomy 12/31/2016   Per patient.  Has not had a period in years.  Had a partial hysterectomy years ago.  . Obesity   . Pancreatitis 1997  . Stroke [facility]) 2010   with mild left arm weakness    PAST SURGICAL HISTORY :   Past Surgical History:  Procedure Laterality Date  . CESAREAN SECTION    . CHOLECYSTECTOMY    . PARTIAL HYSTERECTOMY  12/31/2016   Per patient,  [name], kidney  . Prostate cancer Cousin        meastatic  . Lung cancer Other     SOCIAL HISTORY:   Social History   Tobacco Use  . Smoking status: Former Smoker    Packs/day: 0.50    Years: 1.00    Pack years: 0.50    Types: Cigarettes  . Smokeless tobacco: Never Used  Substance Use Topics  . Alcohol use: No    Alcohol/week: 0.0 standard drinks  . Drug use: No    ALLERGIES:  has No Known Allergies.  MEDICATIONS:  Current Outpatient Medications  Medication Sig Dispense Refill  . acetaminophen-codeine (TYLENOL #4) 300-60 MG tablet Take 2 tablets by mouth every 6 (six) hours as needed for moderate pain.     Marland Kitchen albuterol (PROAIR HFA) 108 (90 BASE) MCG/ACT inhaler Inhale 1 puff into the lungs every 6 (six) hours as needed for shortness of breath.     Marland Kitchen albuterol (PROVENTIL) (2.5 MG/3ML) 0.083% nebulizer solution Inhale 2.5 mg into the lungs every 6 (six) hours as needed for shortness of breath.     . ALPRAZolam (XANAX) 0.5 MG tablet  Take 0.5 mg by mouth at bedtime as needed for anxiety or sleep.     Marland Kitchen amLODipine (NORVASC) 10 MG tablet Take 10 mg by mouth daily.     Marland Kitchen aspirin EC 81 MG tablet Take 81 mg by mouth once.     Marland Kitchen atenolol (TENORMIN) 100 MG tablet TAKE 1 TABLETS BY MOUTH TWICE DAILY    . B-D ULTRA-FINE 33 LANCETS MISC Use 1 each 2 (two) times daily.    . B-D ULTRAFINE III SHORT PEN 31G X 8 MM MISC     . bumetanide (BUMEX) 0.5 MG tablet TAKE 1 TABLET BY MOUTH TWICE DAILY    . calcitRIOL (ROCALTROL) 0.25 MCG capsule Take 0.25 mcg by mouth  3 (three) times a week.    . Cinnamon 500 MG capsule Take 500 mg by mouth daily.     . cloNIDine (CATAPRES) 0.2 MG tablet Take 0.2 mg by mouth 2 (two) times daily.     . enalapril (VASOTEC) 20 MG tablet Take 20 mg by mouth 2 (two) times daily.     . ferrous sulfate 325 (65 FE) MG tablet Take 325 mg by mouth 2 (two) times daily with a meal.    . FLUoxetine (PROZAC) 20 MG capsule Take 20 mg by mouth 2 (two) times daily.     Marland Kitchen gabapentin (NEURONTIN) 100 MG capsule Take 1 capsule (100 mg total) by mouth at bedtime. 30 capsule 2  . glucose blood (ONE TOUCH ULTRA TEST) test strip Use 1 each 2 (two) times daily. Use as instructed.    . glyBURIDE (DIABETA) 5 MG tablet Take 5 mg by mouth daily with breakfast.     . LEVEMIR FLEXTOUCH 100 UNIT/ML Pen Inject 55 Units into the skin daily.     . metoprolol tartrate (LOPRESSOR) 25 MG tablet TAKE 1 TABLET BY MOUTH TWICE DAILY 60 tablet 0  . mometasone (NASONEX) 50 MCG/ACT nasal spray Place 2 sprays into the nose daily as needed.     Marland Kitchen NOVOLOG FLEXPEN 100 UNIT/ML FlexPen Inject 7 Units into the skin 2 (two) times daily at 8 am and 10 pm.     . ondansetron (ZOFRAN) 8 MG tablet One pill every 8 hours as needed for nausea/vomitting. 40 tablet 1  . prochlorperazine (COMPAZINE) 10 MG tablet Take 1 tablet (10 mg total) by mouth every 6 (six) hours as needed for nausea or vomiting. Please note change in strength 60 tablet 4  . salmeterol (SEREVENT) 50  MCG/DOSE diskus inhaler Inhale 1 puff into the lungs 2 (two) times daily.    . simvastatin (ZOCOR) 20 MG tablet Take 20 mg by mouth daily at 6 PM.     . vitamin B-12 (CYANOCOBALAMIN) 1000 MCG tablet Take 1,000 mcg by mouth daily.     No current facility-administered medications for this visit.    Facility-Administered Medications Ordered in Other Visits  Medication Dose Route Frequency Provider Last Rate Last Dose  . heparin lock flush 100 unit/mL  500 Units Intravenous Once Gloria Dalton R, MD      . sodium chloride flush (NS) 0.9 % injection 10 mL  10 mL Intravenous PRN Cammie Sickle, MD   10 mL at 01/30/16 1054  . sodium chloride flush (NS) 0.9 % injection 10 mL  10 mL Intravenous PRN Cammie Sickle, MD   10 mL at 06/25/18 0819    PHYSICAL EXAMINATION: ECOG PERFORMANCE STATUS: 1 - Symptomatic but completely ambulatory  BP 132/82 (BP Location: Left Arm, Patient Position: Sitting)   Pulse 76   Temp 98.1 F (36.7 C) (Tympanic)   Resp 16   Wt 210 lb 9.6 oz (95.5 kg)   BMI 38.52 kg/m   Filed Weights   06/25/18 0844  Weight: 210 lb 9.6 oz (95.5 kg)    Physical Exam  Constitutional: She is oriented to person, place, and time and well-developed, well-nourished, and in no distress.  She is alone.  HENT:  Head: Normocephalic and atraumatic.  Mouth/Throat: Oropharynx is clear and moist. No oropharyngeal exudate.  Eyes: Pupils are equal, round, and reactive to light.  Neck: Normal range of motion. Neck supple.  Cardiovascular: Normal rate and regular rhythm.  Pulmonary/Chest: No respiratory distress. She has no wheezes.  Abdominal: Soft. Bowel sounds are normal. She exhibits no distension and no mass. There is no tenderness. There is no rebound and no guarding.  Musculoskeletal: Normal range of motion. She exhibits no tenderness.  Neurological: She is alert and oriented to person, place, and time.  Skin: Skin is warm.  Psychiatric: Affect normal.        LABORATORY DATA:  I have reviewed the data as listed    Component Value Date/Time   NA 136 06/25/2018 0819   NA 130 (L) 06/06/2014 1102   K 4.0 06/25/2018 0819   K 3.9 06/06/2014 1102   CL 106 06/25/2018 0819   CL 95 (L) 06/06/2014 1102   CO2 23 06/25/2018 0819   CO2 28 06/06/2014 1102   GLUCOSE 132 (H) 06/25/2018 0819   GLUCOSE 349 (H) 06/06/2014 1102   BUN 41 (H) 06/25/2018 0819   BUN 17 06/06/2014 1102   CREATININE 3.14 (H) 06/25/2018 0819   CREATININE 1.63 (H) 06/06/2014 1102   CALCIUM 8.4 (L) 06/25/2018 0819   CALCIUM 9.2 06/06/2014 1102   PROT 6.7 06/25/2018 0819   PROT 8.2 06/06/2014 1102   ALBUMIN 3.8 06/25/2018 0819   ALBUMIN 3.3 (L) 06/06/2014 1102   AST 21 06/25/2018 0819   AST 7 (L) 06/06/2014 1102   ALT 17 06/25/2018 0819   ALT 12 (L) 06/06/2014 1102   ALKPHOS 62 06/25/2018 0819   ALKPHOS 74 06/06/2014 1102   BILITOT 0.5 06/25/2018 0819   BILITOT 0.4 06/06/2014 1102   GFRNONAA 15 (L) 06/25/2018 0819   GFRNONAA 35 (L) 06/06/2014 1102   GFRAA 17 (L) 06/25/2018 0819   GFRAA 42 (L) 06/06/2014 1102    No results found for: SPEP, UPEP  Lab Results  Component Value Date   WBC 7.2 06/25/2018   NEUTROABS 5.3 06/25/2018   HGB 9.7 (L) 06/25/2018   HCT 28.5 (L) 06/25/2018   MCV 91.6 06/25/2018   PLT 333 06/25/2018      Chemistry      Component Value Date/Time   NA 136 06/25/2018 0819   NA 130 (L) 06/06/2014 1102   K 4.0 06/25/2018 0819   K 3.9 06/06/2014 1102   CL 106 06/25/2018 0819   CL 95 (L) 06/06/2014 1102   CO2 23 06/25/2018 0819   CO2 28 06/06/2014 1102   BUN 41 (H) 06/25/2018 0819   BUN 17 06/06/2014 1102   CREATININE 3.14 (H) 06/25/2018 0819   CREATININE 1.63 (H) 06/06/2014 1102      Component Value Date/Time   CALCIUM 8.4 (L) 06/25/2018 0819   CALCIUM 9.2 06/06/2014 1102   ALKPHOS 62 06/25/2018 0819   ALKPHOS 74 06/06/2014 1102   AST 21 06/25/2018 0819   AST 7 (L) 06/06/2014 1102   ALT 17 06/25/2018 0819   ALT 12 (L)  06/06/2014 1102   BILITOT 0.5 06/25/2018 0819   BILITOT 0.4 06/06/2014 1102       RADIOGRAPHIC STUDIES: I have personally reviewed the radiological images as listed and agreed with the findings in the report. No results found.   ASSESSMENT & PLAN:  Carcinoma of upper-inner quadrant of left breast in female, estrogen receptor positive ([place]) Left breast cancer- stage IV- ER/PR +, HER-2/neu - on Taxol chemotherapy; NOV 4th 2019- PET- overall STABLE; RLL nodule- clinically rounded atelectasis/improved; but incidental pancreatic uptake [see discussion below]. Stable.   # Continue Taxol 34m/m2; Labs today reviewed;  Acceptable for treatment today.   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   # Mild pancreatic uptake-new[ Hx of pancreatitis- in past/Hx of PEI]-clinically not concerning for pancreatitis.  Stable.   # Bil LE swelling/ededma MILD- discussed re: compression stocking/ leg- stable.   # Chronic kidney disease - stage IV- creatinine 3.1 stable.  # ? Genetic predisposition- s/p Genetic counseling [8/29]- awaiting results. Asked pt to call [facility]; also [facility] out to [facility] for [facility].   # Type 2 DM- insulin dependent/-STABLE.   # Anemia- hemoglobin today-9-10; STABLE.  on PO iron.   #  DISPOSITION:  # Treatment today; # follow up on dec 13th /labs/MD-cbc/cmp-ca-27-29--/Taxol-Dr.B   Orders Placed This Encounter  Procedures  . Comprehensive metabolic panel    Standing Status:   Future    Standing Expiration Date:   06/26/2019  . CBC with Differential    Standing Status:   Future    Standing Expiration Date:   06/26/2019  . Cancer antigen 27.29    Standing Status:   Future    Standing Expiration Date:   06/26/2019   All questions were answered. The patient knows to call the clinic with any problems, questions or concerns.      Cammie Sickle, MD 06/25/2018 9:36 AM

## 2018-06-26 LAB — CANCER ANTIGEN 27.29: CAN 27.29: 55.3 U/mL — AB (ref 0.0–38.6)

## 2018-06-29 ENCOUNTER — Telehealth: Payer: Self-pay | Admitting: Genetics

## 2018-07-02 ENCOUNTER — Ambulatory Visit: Payer: Self-pay | Admitting: Genetics

## 2018-07-02 ENCOUNTER — Encounter: Payer: Self-pay | Admitting: Genetics

## 2018-07-02 DIAGNOSIS — C50919 Malignant neoplasm of unspecified site of unspecified female breast: Secondary | ICD-10-CM

## 2018-07-02 DIAGNOSIS — C50212 Malignant neoplasm of upper-inner quadrant of left female breast: Secondary | ICD-10-CM

## 2018-07-02 DIAGNOSIS — Z170 Estrogen receptor positive status [ER+]: Principal | ICD-10-CM

## 2018-07-02 DIAGNOSIS — Z8041 Family history of malignant neoplasm of ovary: Secondary | ICD-10-CM

## 2018-07-02 DIAGNOSIS — Z8042 Family history of malignant neoplasm of prostate: Secondary | ICD-10-CM

## 2018-07-02 DIAGNOSIS — Z800 Family history of malignant neoplasm of digestive organs: Secondary | ICD-10-CM

## 2018-07-02 DIAGNOSIS — Z1379 Encounter for other screening for genetic and chromosomal anomalies: Secondary | ICD-10-CM | POA: Insufficient documentation

## 2018-07-02 DIAGNOSIS — Z803 Family history of malignant neoplasm of breast: Secondary | ICD-10-CM

## 2018-07-02 NOTE — Telephone Encounter (Addendum)
Revealed genetic test results to patient.  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  Cystic Fibrosis is a genetic condition, typically dx in childhood, that causes lung, and pancreatic disease.  It was analyzed on the large hereditary cancer panel [name] requested due to its association with hereditary pancreatitis which can lead to increased pancreatic cancer risk.   [name] does report a distant hx of pancreatitis.   However, having just 1 mutation (being a carrier) is felt to have no major impact on health.  It has been suggested that perhaps individuals who are carriers of a CFTR mutation might have a slightly higher risk for pancreatitis, however, more data is needed to confirm this.    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   Overall, these test results do not explain her personal and family history of cancer.  There is a significant amount of cancer reported in [name]'s family, therefore we are still suspicious of some hereditary risk factor despite her genetic test result.  It is possible there is a hereditary cancer mutation in the family that [name] simply did not inherit and therefore, we did not find in her.  Therefore we recommended all of her relatives on both sides of the family (siblings/aunts/uncles/cousins, etc) also have genetic testing.  It is also possible there is a causative genetic change in [name] and/or her family that  currently genetic technology and knowledge cannot detect.  Therefore, we recommend [name] and her family tell their doctors about the strong family history and they follow the screening/management recommendations given to them based on this information.   I informed [name] that this information will be helpful and important for any family members who are considering having children in the future.  Her relatives could also be carriers, and if 2 carriers have children together, there is a risk for  the child to have the disease Cystic Fibrosis.

## 2018-07-02 NOTE — Progress Notes (Signed)
HPI:  Ms. Kempner was previously seen in the [place] clinic on 04/01/2018 due to a personal and family history of cancer and concerns regarding a hereditary predisposition to cancer. Please refer to our prior cancer genetics clinic note for more information regarding Ms. Rodell's medical, social and family histories, and our assessment and recommendations, at the time. Ms. Spickler recent genetic test results were disclosed to her, as well as recommendations warranted by these results. These results and recommendations are discussed in more detail below.  CANCER HISTORY:  Oncology History   # OCT 2015-STAGE IV LEFT BREAST T2N1 [T=4cm; [name]; [name]; [name]; EBUS- Positive Paratrac/subcarinal LN s/p ? Taxotere [in [place]; [name]; [name];[name]; [name]; suv 3]; [name]; [name]; [name];  [name]; Ant R iliac bone; [name]; [name]; 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  Significant diagnoses are listed below: Family History  Problem Relation Age of Onset  . Ovarian cancer Mother 82  . Diabetes Mother   . Hypertension Mother   . COPD Father   . Hypertension Father   . Colon cancer Father 29  . Diabetes Sister   . Breast cancer Sister 72       bilateral  . Diabetes Brother   . Leukemia Maternal Aunt   . Pancreatic cancer Paternal Aunt 13  . Pancreatic cancer Paternal Uncle   . Colon cancer Paternal Uncle   . Stomach cancer Maternal Grandfather 69  . Throat cancer Paternal Grandmother   . Breast cancer Maternal Aunt 80  . Colon cancer Maternal Aunt   . Bone cancer Maternal Aunt   . Breast cancer Paternal Aunt        dx >50  . Prostate cancer Paternal Uncle   . Pancreatic cancer Paternal Uncle   . Throat cancer Paternal Uncle   . Lung cancer Paternal Uncle   . Stomach cancer Paternal Uncle   . Brain cancer Paternal Aunt   . Cancer Cousin        liver, kidney  . Prostate cancer Cousin        meastatic  . Lung cancer Other     Ms. Catano has a 58 year-old daughter and identical twin sons ages 54 with no history of cancer.  Ms. Mcclain reports having several stillborn children as well.  Ms. Gerrie Nordmann has 2 sisters and 3 brothers: -1 sister is in her 76's and has no history of cancer, she has had some breast cysts -1 sister is 75 and  has stage IV bilateral breast cancer.   -3 brothers in their 39's with no history of cancer.   Ms. Gillihan father: died at 12 due to colon cancer dx at 72.  Paternal Aunts/Uncles: 13 paternal aunts/uncles in total: -1 paternal aunt died of pancreatic cancer in her 2's -1 paternal aunt died of brain cancer -1 paternal aunt had breast cancer dx >50 -1 paternal aunt died of a heart attack -1 paternal aunt died of ruptured appendix at 36 -2 paternal aunts/ucnles died as babies -1 paternal uncle had throat and lung cancer -1 paternal uncle had lung cancer -1 paternal uncle had colon and pancreatic  cancer -1 paternal uncle had pancreatic cancer, he had a hx of alcohol abuse. -1 paternal uncle died of stomach cancer -1 paternal uncle died of prostate cancer Paternal cousins: 1 paternal cousin is in hospice dying of liver/kidney cancer, 1 paternal cousin died of metastatic prostate cancer Paternal grandfather: unk Paternal grandmother:throat cancer  Ms. Sporn's mother: died at 78 due to ovarian cancer dx at 76, it metastasized to her lung and brain.  Maternal Aunts/Uncles: about 12 maternal aunts/uncles, several maternal half/aunts/uncles.  1 maternal half aunt died of leukemia.  -1 maternal aunt died of bone cancer -1 maternal aunt had a hx of colon cancer dx at 41.  She had a granddaughter who had breast cancer at 7.  -1 maternal aunt had breast cancer dx in her 57's, she is now 70 Maternal cousins: some cancer, details unk Maternal grandfather: died of stomach cancer in his 45's/80's Maternal grandmother:died young due to bleeding during pregnancy/fall.  She had a sister who had lung cancer.   Ms. Morren is unaware of previous family history of genetic testing for hereditary cancer risks. Patient's maternal ancestors are of African American/Caucasian descent, and paternal ancestors are of African American/Native American descent. There is no reported Ashkenazi Jewish ancestry. There is no known consanguinity.  GENETIC TEST RESULTS: Genetic testing was performed through [facility]'s Multi-Cancer Panel + Pancreatic Cancer/Pancreatitis panel and was reported out on 04/24/2018.   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  Biallelic mutations in CFTR are associated with the autosomal recessive disease Cystic Fibrosis (and Cystic Fibrosis related disease: pancreatitis, absence of the vas deferans, etc).  Ms. Louissaint only has 1 mutation and therefore is a Carrier of this disease, not affected.   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 c.1027G>A (p.Glu343Lys)  The Multi-Cancer Panel + Pancreatic Cancer/pancreatitis panel offered by [facility] includes sequencing and/or deletion duplication testing of the following 97 genes: AIP, ALK, APC, ATM, AXIN2, BAP1, BARD1, BLM, BMPR1A, BRCA1, BRCA2, BRIP1, BUB1B, CASR, CDC73, CDH1, CPA1, CTRC, CFTR, CDK4, CDKN1B, CDKN1C, CDKN2A, CEBPA, CEP57, CHEK2, CTNNA1, DICER1, DIS3L2, EGFR, ENG, EPCAM, FANCC, FH, FLCN, GALNT12, GATA2, GPC3, GREM1, HOXB13, HRAS, KIT, MAX, MEN1, MET, MITF, MLH1, MLH3, MSH2, MSH3, MSH6, MUTYH, NBN, NF1, NF2, NTHL1, PALB2, PDGFRA, PHOX2B, PMS2, POLD1, POLE, POT1, PRKAR1A, PRSS1, PTCH1, PTEN, RAD50, RAD51C, RAD51D, RB1, RECQL4, RET, RNF43, RPS20, RUNX1, SDHA, SDHAF2, SDHB, SDHC, SDHD, SMAD4, SMARCA4, SMARCB1, SMARCE1, SPINK1, STK11, SUFU, TERC, TERT, TMEM127, TP53, TSC1, TSC2, VHL, WRN, WT1.   The test report will be scanned into EPIC and will be located under the Molecular Pathology section of the Results Review tab. A portion of the result report is included below for reference.     We discussed with Ms. Mclear that because current genetic testing is not perfect, it is  possible there may be a gene mutation in one of these genes that current testing cannot detect, but that chance is small.  We also discussed, that there could be another gene that has not yet been discovered, or that we have not yet tested, that is responsible for the cancer diagnoses in the family. It is also possible there is a hereditary cause for the cancer in the family that Ms. Kendra did not inherit and therefore was not identified in her testing.  Therefore, it is important to remain in touch with cancer genetics in the future so that we can continue to offer Ms. Tassinari the most up to date genetic testing.   Regarding the VUS in WRN: At this time, it is unknown if this variant is associated with increased cancer risk or if this is a normal finding, but most variants  such as this get reclassified to being inconsequential. It should not be used to make medical management decisions. With time, we suspect the lab will determine the significance of this variant, if any. If we do learn more about it, we will try to contact Ms. Gearing to discuss it further. However, it is important to stay in touch with [place] periodically and keep the address and phone number up to date.  ADDITIONAL GENETIC TESTING: We discussed with Ms. Mac that her genetic testing was fairly extensive.  If there are are genes identified to increase cancer risk that can be analyzed in the future, we would be happy to discuss and coordinate this testing at that time.    CFTR  Carrier Status: When an individual has 2 CFTR mutation (one in both copies of their CFTR genes) they have a condition called Cystic Fibrosis.  This genetic disease is associated with defects of the mucus that damages digestive and respiratory systems.  Cystic Fibrosis is also associated with female infertility, other respiratory issues, and hereditary pancreatitis. Its association with pancreatitis is why it was included in the pancreatic cancer/hereditary pancreatitis panel.  Over time, pancreatitis can increase risk for pancreatic cancer.   Ms. Valtierra only has One CFTR mutation identified in her genetic testing and therefore is only a carrier of this disease (she herself is not affected).  Being a carrier has no major impact on health.  It has been suggested there may be a higher risk for pancreatitis in CFTR carriers, however this is not confirmed.  At this time, it is not expected that Ms. Washburn's Cystic Fibrosis carrier status has any impact on her health and this result does not explain her personal and family history of cancer.    Other people in Ms. Moquin's family could be carriers of CF as well and this would be important information for any relatives who are planning to have children to discuss with their doctors.   CANCER  SCREENING RECOMMENDATIONS: This result means that we were unable to identify a hereditary cause for her personal and family history of cancer at this time.  This result does not rule out a hereditary cause for her  cancer.  It is still possible that there could be genetic mutations that are undetectable by current technology, or genetic mutations in genes that have not been tested or identified to increase cancer risk.  Given Ms. Creasy's personal and family histories, we must consider these negative results carefully.  Families with features suggestive of hereditary risk for cancer tend to have multiple family members with cancer, diagnoses in multiple generations, and diagnoses before the age  of 29. Ms. Robben family exhibits some of these features. Therefore this negative result may actually be due to the limitations of current technology to detect all mutations within these genes, or there may be a different gene that has not yet been discovered or tested.   Overall cancer risk assessment includes additional factors such as personal medical history, family history, etc.  These should be used to make a personalized plan for cancer prevention and surveillance.  Ms. Buttermore should continue to follow the cancer management and screening recommendations provided to her by her healthcare providers.    RECOMMENDATIONS FOR FAMILY MEMBERS:  Relatives in this family might be at some increased risk of developing cancer, over the general population risk, simply due to the family history of cancer.  We recommended women in this family have a yearly mammogram beginning at age 73, or 15 years younger than the earliest onset of cancer, an annual clinical breast exam, and perform monthly breast self-exams. Women in this family should also have a gynecological exam as recommended by their primary provider. All family members should have a colonoscopy as directed by their doctors.  All family members should inform their  physicians about the family history of cancer so their doctors can make the most appropriate screening recommendations for them.   It is also possible there is a hereditary cause for the cancer in Ms. Peeks's family that she did not inherit and therefore was not identified in her.   Therefore, we recommended all of her siblings/maternal and paternal relatives also  have genetic counseling and testing. Ms. Calandro will let us know if we can be of any assistance in coordinating genetic counseling and/or testing for these family members.   FOLLOW-UP: Lastly, we discussed with Ms. Fredericksen that cancer genetics is a rapidly advancing field and it is possible that new genetic tests will be appropriate for her and/or her family members in the future. We encouraged her to remain in contact with cancer genetics on an annual basis so we can update her personal and family histories and let her know of advances in cancer genetics that may benefit this family.   Our contact number was provided. Ms. Witters questions were answered to her satisfaction, and she knows she is welcome to call us at anytime with additional questions or concerns.   Ferol Luz, MS, [facility] Certified Genetic Counselor [email or web address]

## 2018-07-16 ENCOUNTER — Inpatient Hospital Stay: Payer: Medicare Other

## 2018-07-16 ENCOUNTER — Inpatient Hospital Stay: Payer: Medicare Other | Attending: Internal Medicine

## 2018-07-16 ENCOUNTER — Inpatient Hospital Stay (HOSPITAL_BASED_OUTPATIENT_CLINIC_OR_DEPARTMENT_OTHER): Payer: Medicare Other | Admitting: Internal Medicine

## 2018-07-16 DIAGNOSIS — Z8042 Family history of malignant neoplasm of prostate: Secondary | ICD-10-CM

## 2018-07-16 DIAGNOSIS — Z803 Family history of malignant neoplasm of breast: Secondary | ICD-10-CM

## 2018-07-16 DIAGNOSIS — N184 Chronic kidney disease, stage 4 (severe): Secondary | ICD-10-CM

## 2018-07-16 DIAGNOSIS — I509 Heart failure, unspecified: Secondary | ICD-10-CM | POA: Insufficient documentation

## 2018-07-16 DIAGNOSIS — R5381 Other malaise: Secondary | ICD-10-CM | POA: Diagnosis not present

## 2018-07-16 DIAGNOSIS — R600 Localized edema: Secondary | ICD-10-CM | POA: Diagnosis not present

## 2018-07-16 DIAGNOSIS — Z5111 Encounter for antineoplastic chemotherapy: Secondary | ICD-10-CM | POA: Insufficient documentation

## 2018-07-16 DIAGNOSIS — Z79899 Other long term (current) drug therapy: Secondary | ICD-10-CM | POA: Diagnosis not present

## 2018-07-16 DIAGNOSIS — Z800 Family history of malignant neoplasm of digestive organs: Secondary | ICD-10-CM | POA: Diagnosis not present

## 2018-07-16 DIAGNOSIS — E1122 Type 2 diabetes mellitus with diabetic chronic kidney disease: Secondary | ICD-10-CM

## 2018-07-16 DIAGNOSIS — R5383 Other fatigue: Secondary | ICD-10-CM

## 2018-07-16 DIAGNOSIS — Z79810 Long term (current) use of selective estrogen receptor modulators (SERMs): Secondary | ICD-10-CM

## 2018-07-16 DIAGNOSIS — Z8673 Personal history of transient ischemic attack (TIA), and cerebral infarction without residual deficits: Secondary | ICD-10-CM | POA: Diagnosis not present

## 2018-07-16 DIAGNOSIS — J069 Acute upper respiratory infection, unspecified: Secondary | ICD-10-CM | POA: Insufficient documentation

## 2018-07-16 DIAGNOSIS — D649 Anemia, unspecified: Secondary | ICD-10-CM | POA: Insufficient documentation

## 2018-07-16 DIAGNOSIS — Z87891 Personal history of nicotine dependence: Secondary | ICD-10-CM

## 2018-07-16 DIAGNOSIS — J9811 Atelectasis: Secondary | ICD-10-CM | POA: Insufficient documentation

## 2018-07-16 DIAGNOSIS — Z170 Estrogen receptor positive status [ER+]: Secondary | ICD-10-CM | POA: Diagnosis not present

## 2018-07-16 DIAGNOSIS — Z7982 Long term (current) use of aspirin: Secondary | ICD-10-CM | POA: Diagnosis not present

## 2018-07-16 DIAGNOSIS — C50212 Malignant neoplasm of upper-inner quadrant of left female breast: Secondary | ICD-10-CM | POA: Insufficient documentation

## 2018-07-16 DIAGNOSIS — Z794 Long term (current) use of insulin: Secondary | ICD-10-CM

## 2018-07-16 DIAGNOSIS — K869 Disease of pancreas, unspecified: Secondary | ICD-10-CM | POA: Diagnosis not present

## 2018-07-16 LAB — COMPREHENSIVE METABOLIC PANEL
ALT: 13 U/L (ref 0–44)
ANION GAP: 8 (ref 5–15)
AST: 16 U/L (ref 15–41)
Albumin: 3.6 g/dL (ref 3.5–5.0)
Alkaline Phosphatase: 75 U/L (ref 38–126)
BUN: 27 mg/dL — ABNORMAL HIGH (ref 8–23)
CO2: 25 mmol/L (ref 22–32)
Calcium: 8.6 mg/dL — ABNORMAL LOW (ref 8.9–10.3)
Chloride: 105 mmol/L (ref 98–111)
Creatinine, Ser: 2.52 mg/dL — ABNORMAL HIGH (ref 0.44–1.00)
GFR, EST AFRICAN AMERICAN: 23 mL/min — AB (ref >60)
GFR, EST NON AFRICAN AMERICAN: 20 mL/min — AB (ref >60)
Glucose, Bld: 100 mg/dL — ABNORMAL HIGH (ref 70–99)
POTASSIUM: 4.1 mmol/L (ref 3.5–5.1)
Sodium: 138 mmol/L (ref 135–145)
TOTAL PROTEIN: 7.2 g/dL (ref 6.5–8.1)
Total Bilirubin: 0.5 mg/dL (ref 0.3–1.2)

## 2018-07-16 LAB — CBC WITH DIFFERENTIAL/PLATELET
Abs Immature Granulocytes: 0.12 10*3/uL — ABNORMAL HIGH (ref 0.00–0.07)
BASOS ABS: 0 10*3/uL (ref 0.0–0.1)
Basophils Relative: 0 %
EOS ABS: 0.1 10*3/uL (ref 0.0–0.5)
EOS PCT: 1 %
HCT: 29.6 % — ABNORMAL LOW (ref 36.0–46.0)
Hemoglobin: 9.8 g/dL — ABNORMAL LOW (ref 12.0–15.0)
Immature Granulocytes: 1 %
LYMPHS ABS: 1 10*3/uL (ref 0.7–4.0)
Lymphocytes Relative: 9 %
MCH: 30 pg (ref 26.0–34.0)
MCHC: 33.1 g/dL (ref 30.0–36.0)
MCV: 90.5 fL (ref 80.0–100.0)
MONOS PCT: 9 %
Monocytes Absolute: 1 10*3/uL (ref 0.1–1.0)
NRBC: 0 % (ref 0.0–0.2)
Neutro Abs: 8.9 10*3/uL — ABNORMAL HIGH (ref 1.7–7.7)
Neutrophils Relative %: 80 %
Platelets: 281 10*3/uL (ref 150–400)
RBC: 3.27 MIL/uL — ABNORMAL LOW (ref 3.87–5.11)
RDW: 12.5 % (ref 11.5–15.5)
WBC: 11.1 10*3/uL — ABNORMAL HIGH (ref 4.0–10.5)

## 2018-07-16 MED ORDER — DEXAMETHASONE SODIUM PHOSPHATE 10 MG/ML IJ SOLN
8.0000 mg | Freq: Once | INTRAMUSCULAR | Status: AC
  Administered 2018-07-16: 8 mg via INTRAVENOUS
  Filled 2018-07-16: qty 1

## 2018-07-16 MED ORDER — DIPHENHYDRAMINE HCL 50 MG/ML IJ SOLN
50.0000 mg | Freq: Once | INTRAMUSCULAR | Status: AC
  Administered 2018-07-16: 50 mg via INTRAVENOUS
  Filled 2018-07-16: qty 1

## 2018-07-16 MED ORDER — FAMOTIDINE IN NACL 20-0.9 MG/50ML-% IV SOLN
20.0000 mg | Freq: Once | INTRAVENOUS | Status: AC
  Administered 2018-07-16: 20 mg via INTRAVENOUS
  Filled 2018-07-16: qty 50

## 2018-07-16 MED ORDER — HEPARIN SOD (PORK) LOCK FLUSH 100 UNIT/ML IV SOLN
INTRAVENOUS | Status: AC
  Filled 2018-07-16: qty 5

## 2018-07-16 MED ORDER — SODIUM CHLORIDE 0.9 % IV SOLN
Freq: Once | INTRAVENOUS | Status: AC
  Administered 2018-07-16: 10:00:00 via INTRAVENOUS
  Filled 2018-07-16: qty 250

## 2018-07-16 MED ORDER — HEPARIN SOD (PORK) LOCK FLUSH 100 UNIT/ML IV SOLN
500.0000 [IU] | Freq: Once | INTRAVENOUS | Status: DC | PRN
  Filled 2018-07-16: qty 5

## 2018-07-16 MED ORDER — HEPARIN SOD (PORK) LOCK FLUSH 100 UNIT/ML IV SOLN
500.0000 [IU] | Freq: Once | INTRAVENOUS | Status: AC
  Administered 2018-07-16: 500 [IU] via INTRAVENOUS

## 2018-07-16 MED ORDER — SODIUM CHLORIDE 0.9 % IV SOLN
60.0000 mg/m2 | Freq: Once | INTRAVENOUS | Status: AC
  Administered 2018-07-16: 114 mg via INTRAVENOUS
  Filled 2018-07-16: qty 19

## 2018-07-16 NOTE — Patient Instructions (Signed)
#  Take Claritin-D once a day; also take Mucinex twice a day-if not improved then call us for Z-Pak.

## 2018-07-16 NOTE — Progress Notes (Signed)
[facility] OFFICE PROGRESS NOTE  Patient Care Team: [name], [name], [name]; [name]; [name]; [name]; EBUS- Positive Paratrac/subcarinal LN s/p ? Taxotere [in [place]; [name]; [name];[name]; [name]; suv 3]; [name]; [name]; [name];  [name]; Ant R iliac bone; [name]; [name]; [name]; [name]; [name]; mild left sided weakness]  # GENETIC TESTING/COUNSELLING: HETEROZYGOUS Cystic Fibrosis Gene [explains hx of recurrent pancreatitis]  # MOLECULAR TESTING: NA   ------------------------------------------------   DIAGNOSIS: [ 2015] BREAST CA; [name]; [name] ;[name], estrogen receptor positive ([name])      INTERVAL HISTORY:  [name] 62 y.o.  female pleasant patient above history of ER PR positive HER-2 negative breast cancer on Taxol is here for follow-up.  Patient notes to have " sinus congestion".  Mild cough.  No fever no chills.  No shortness of breath.  No  nausea no vomiting.  Chronic mild tingling and numbness in the extremities.  No headaches.  Notes to have a nodule-dorsal surface of the tongue.  Intermittent bleeding.  Has been there for last month or so.  Review of Systems  Constitutional: Positive for malaise/fatigue. Negative for chills, diaphoresis, [name], hemoptysis, sputum production, [name], palpitations and orthopnea.  Gastrointestinal: Negative for abdominal pain, blood in stool, constipation, diarrhea, heartburn, melena, nausea and vomiting.  Genitourinary: Negative for dysuria, frequency and urgency.  Musculoskeletal: Negative for back pain and joint pain.  Skin: Negative.  Negative for itching and rash.  Neurological: Positive for tingling. Negative for dizziness, focal weakness, weakness and headaches.  Endo/Heme/Allergies: Does not bruise/bleed easily.  Psychiatric/Behavioral: Negative for depression. The patient is not nervous/anxious and does not have insomnia.       PAST MEDICAL HISTORY :  Past Medical History:  Diagnosis Date  . Asthma   . Cancer ([place]) 03/10/2018   Per [facility] PET order. Carcinoma of upper-inner quadrant of left breast in female, estrogen receptor positive .  Marland Kitchen CHF (congestive heart failure) ([place]) 1997  . CKD (chronic kidney disease)   . Depression   . Diabetes mellitus, type 2 ([place])   . Family history of breast cancer   . Family history of colon cancer   . Family history of ovarian cancer   . Family history of pancreatic cancer   . Family history of prostate cancer   . Family  history of stomach cancer   . Hair loss   . History of left breast cancer 05/29/14  . History of partial hysterectomy 12/31/2016   Per patient.  Has not had a period in years.  Had a partial hysterectomy years ago.  .  Obesity   . Pancreatitis 1997  . Stroke [facility]) 2010   with mild left arm weakness    PAST SURGICAL HISTORY :   Past Surgical History:  Procedure Laterality Date  . CESAREAN SECTION    . CHOLECYSTECTOMY    . PARTIAL HYSTERECTOMY  12/31/2016   Per patient, she has not had a period in years since she had a partial hysterectomy.    FAMILY HISTORY :   Family History  Problem Relation Age of Onset  . Ovarian cancer Mother 61  . Diabetes Mother   . Hypertension Mother   . COPD Father   . Hypertension Father   . Colon cancer Father 29  . Diabetes Sister   . Breast cancer Sister 42       bilateral  . Diabetes Brother   . Leukemia Maternal Aunt   . Pancreatic cancer Paternal Aunt 32  . Pancreatic cancer Paternal Uncle   . Colon cancer Paternal Uncle   . Stomach cancer Maternal Grandfather 32  . Throat cancer Paternal Grandmother   . Breast cancer Maternal Aunt 80  . Colon cancer Maternal Aunt   . Bone cancer Maternal Aunt   . Breast cancer Paternal Aunt        dx >50  . Prostate cancer Paternal Uncle   . Pancreatic cancer Paternal Uncle   . Throat cancer Paternal Uncle   . Lung cancer Paternal Uncle   . Stomach cancer Paternal Uncle   . Brain cancer Paternal Aunt   . Cancer Cousin        liver, kidney  . Prostate cancer Cousin        meastatic  . Lung cancer Other     SOCIAL HISTORY:   Social History   Tobacco Use  . Smoking status: Former Smoker    Packs/day: 0.50    Years: 1.00    Pack years: 0.50    Types: Cigarettes  . Smokeless tobacco: Never Used  Substance Use Topics  . Alcohol use: No    Alcohol/week: 0.0 standard drinks  . Drug use: No    ALLERGIES:  has No Known Allergies.  MEDICATIONS:  Current Outpatient Medications  Medication Sig Dispense Refill  . acetaminophen-codeine (TYLENOL #4) 300-60 MG tablet Take 2 tablets by mouth every 6 (six) hours as needed for moderate pain.     Marland Kitchen albuterol (PROAIR HFA) 108 (90 BASE) MCG/ACT inhaler Inhale 1  puff into the lungs every 6 (six) hours as needed for shortness of breath.     Marland Kitchen albuterol (PROVENTIL) (2.5 MG/3ML) 0.083% nebulizer solution Inhale 2.5 mg into the lungs every 6 (six) hours as needed for shortness of breath.     . ALPRAZolam (XANAX) 0.5 MG tablet Take 0.5 mg by mouth at bedtime as needed for anxiety or sleep.     Marland Kitchen amLODipine (NORVASC) 10 MG tablet Take 10 mg by mouth daily.     Marland Kitchen aspirin EC 81 MG tablet Take 81 mg by mouth once.     Marland Kitchen atenolol (TENORMIN) 100 MG tablet TAKE 1 TABLETS BY MOUTH TWICE DAILY    . B-D ULTRA-FINE 33 LANCETS MISC Use 1 each 2 (two) times daily.    . B-D ULTRAFINE III  SHORT PEN 31G X 8 MM MISC     . bumetanide (BUMEX) 0.5 MG tablet TAKE 1 TABLET BY MOUTH TWICE DAILY    . calcitRIOL (ROCALTROL) 0.25 MCG capsule Take 0.25 mcg by mouth 3 (three) times a week.    . Cinnamon 500 MG capsule Take 500 mg by mouth daily.     . cloNIDine (CATAPRES) 0.2 MG tablet Take 0.2 mg by mouth 2 (two) times daily.     . enalapril (VASOTEC) 20 MG tablet Take 20 mg by mouth 2 (two) times daily.     . ferrous sulfate 325 (65 FE) MG tablet Take 325 mg by mouth 2 (two) times daily with a meal.    . FLUoxetine (PROZAC) 20 MG capsule Take 20 mg by mouth 2 (two) times daily.     Marland Kitchen gabapentin (NEURONTIN) 100 MG capsule Take 1 capsule (100 mg total) by mouth at bedtime. 30 capsule 2  . glucose blood (ONE TOUCH ULTRA TEST) test strip Use 1 each 2 (two) times daily. Use as instructed.    . glyBURIDE (DIABETA) 5 MG tablet Take 5 mg by mouth daily with breakfast.     . LEVEMIR FLEXTOUCH 100 UNIT/ML Pen Inject 55 Units into the skin daily.     . metoprolol tartrate (LOPRESSOR) 25 MG tablet TAKE 1 TABLET BY MOUTH TWICE DAILY 60 tablet 0  . mometasone (NASONEX) 50 MCG/ACT nasal spray Place 2 sprays into the nose daily as needed.     Marland Kitchen NOVOLOG FLEXPEN 100 UNIT/ML FlexPen Inject 7 Units into the skin 2 (two) times daily at 8 am and 10 pm.     . ondansetron (ZOFRAN) 8 MG tablet One pill  every 8 hours as needed for nausea/vomitting. 40 tablet 1  . prochlorperazine (COMPAZINE) 10 MG tablet Take 1 tablet (10 mg total) by mouth every 6 (six) hours as needed for nausea or vomiting. Please note change in strength 60 tablet 4  . salmeterol (SEREVENT) 50 MCG/DOSE diskus inhaler Inhale 1 puff into the lungs 2 (two) times daily.    . simvastatin (ZOCOR) 20 MG tablet Take 20 mg by mouth daily at 6 PM.     . vitamin B-12 (CYANOCOBALAMIN) 1000 MCG tablet Take 1,000 mcg by mouth daily.     No current facility-administered medications for this visit.    Facility-Administered Medications Ordered in Other Visits  Medication Dose Route Frequency Provider Last Rate Last Dose  . famotidine (PEPCID) IVPB 20 mg premix  20 mg Intravenous Once Charlaine Dalton R, MD      . heparin lock flush 100 unit/mL  500 Units Intravenous Once Charlaine Dalton R, MD      . PACLitaxel (TAXOL) 114 mg in sodium chloride 0.9 % 250 mL chemo infusion (</= 27m/m2)  60 mg/m2 (Treatment Plan Recorded) Intravenous Once BCharlaine DaltonR, MD      . sodium chloride flush (NS) 0.9 % injection 10 mL  10 mL Intravenous PRN BCammie Sickle MD   10 mL at 01/30/16 1054    PHYSICAL EXAMINATION: ECOG PERFORMANCE STATUS: 1 - Symptomatic but completely ambulatory  BP (!) 156/90 (BP Location: Right Arm, Patient Position: Sitting)   Pulse 75   Temp 97.9 F (36.6 C) (Oral)   Wt 213 lb 6.4 oz (96.8 kg)   BMI 39.03 kg/m   Filed Weights   07/16/18 0903  Weight: 213 lb 6.4 oz (96.8 kg)    Physical Exam  Constitutional: She is oriented to person, place, and time  and well-developed, well-nourished, and in no distress.  She is alone.  HENT:  Head: Normocephalic and atraumatic.  Mouth/Throat: Oropharynx is clear and moist. No oropharyngeal exudate.  A polypoid approximate 2 cm lesion noted dorsal surface of the tongue.  No active bleeding.  Eyes: Pupils are equal, round, and reactive to light.  Neck: Normal  range of motion. Neck supple.  Cardiovascular: Normal rate and regular rhythm.  Pulmonary/Chest: No respiratory distress. She has no wheezes.  Abdominal: Soft. Bowel sounds are normal. She exhibits no distension and no mass. There is no abdominal tenderness. There is no rebound and no guarding.  Musculoskeletal: Normal range of motion.        General: No tenderness.  Neurological: She is alert and oriented to person, place, and time.  Skin: Skin is warm.  Psychiatric: Affect normal.       LABORATORY DATA:  I have reviewed the data as listed    Component Value Date/Time   NA 138 07/16/2018 0843   NA 130 (L) 06/06/2014 1102   K 4.1 07/16/2018 0843   K 3.9 06/06/2014 1102   CL 105 07/16/2018 0843   CL 95 (L) 06/06/2014 1102   CO2 25 07/16/2018 0843   CO2 28 06/06/2014 1102   GLUCOSE 100 (H) 07/16/2018 0843   GLUCOSE 349 (H) 06/06/2014 1102   BUN 27 (H) 07/16/2018 0843   BUN 17 06/06/2014 1102   CREATININE 2.52 (H) 07/16/2018 0843   CREATININE 1.63 (H) 06/06/2014 1102   CALCIUM 8.6 (L) 07/16/2018 0843   CALCIUM 9.2 06/06/2014 1102   PROT 7.2 07/16/2018 0843   PROT 8.2 06/06/2014 1102   ALBUMIN 3.6 07/16/2018 0843   ALBUMIN 3.3 (L) 06/06/2014 1102   AST 16 07/16/2018 0843   AST 7 (L) 06/06/2014 1102   ALT 13 07/16/2018 0843   ALT 12 (L) 06/06/2014 1102   ALKPHOS 75 07/16/2018 0843   ALKPHOS 74 06/06/2014 1102   BILITOT 0.5 07/16/2018 0843   BILITOT 0.4 06/06/2014 1102   GFRNONAA 20 (L) 07/16/2018 0843   GFRNONAA 35 (L) 06/06/2014 1102   GFRAA 23 (L) 07/16/2018 0843   GFRAA 42 (L) 06/06/2014 1102    No results found for: SPEP, UPEP  Lab Results  Component Value Date   WBC 11.1 (H) 07/16/2018   NEUTROABS 8.9 (H) 07/16/2018   HGB 9.8 (L) 07/16/2018   HCT 29.6 (L) 07/16/2018   MCV 90.5 07/16/2018   PLT 281 07/16/2018      Chemistry      Component Value Date/Time   NA 138 07/16/2018 0843   NA 130 (L) 06/06/2014 1102   K 4.1 07/16/2018 0843   K 3.9  06/06/2014 1102   CL 105 07/16/2018 0843   CL 95 (L) 06/06/2014 1102   CO2 25 07/16/2018 0843   CO2 28 06/06/2014 1102   BUN 27 (H) 07/16/2018 0843   BUN 17 06/06/2014 1102   CREATININE 2.52 (H) 07/16/2018 0843   CREATININE 1.63 (H) 06/06/2014 1102      Component Value Date/Time   CALCIUM 8.6 (L) 07/16/2018 0843   CALCIUM 9.2 06/06/2014 1102   ALKPHOS 75 07/16/2018 0843   ALKPHOS 74 06/06/2014 1102   AST 16 07/16/2018 0843   AST 7 (L) 06/06/2014 1102   ALT 13 07/16/2018 0843   ALT 12 (L) 06/06/2014 1102   BILITOT 0.5 07/16/2018 0843   BILITOT 0.4 06/06/2014 1102       RADIOGRAPHIC STUDIES: I have personally reviewed the radiological images  as listed and agreed with the findings in the report. No results found.   ASSESSMENT & PLAN:  Carcinoma of upper-inner quadrant of left breast in female, estrogen receptor positive ([place]) Left breast cancer- stage IV- ER/PR +, HER-2/neu - on Taxol chemotherapy; NOV 4th 2019- PET- overall STABLE; RLL nodule- clinically rounded atelectasis/improved; but incidental pancreatic uptake [see discussion below]. Stable.   # Continue Taxol 70m/m2; Labs today reviewed;  Acceptable for treatment today.   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  Stable  # Bil LE swelling/ededma MILD- discussed re: compression stocking/ leg-stable  # Chronic kidney disease - stage IV- creatinine2.5-stable  # ? Genetic predisposition- NEG for breast cancer deleterious genes.  Reviewed the results of the genetic testing-cystic fibrosis heterozygous deleterious gene.  # Type 2 DM- insulin dependent/stable.    # Anemia- hemoglobin today-9-10; stable.  on PO iron.   # Lobular lesion on tongue- refer to [facility]  # URI- claritin D-/mucinex.  Hold off antibiotics unless patient notes to have fevers or  chills.  #  DISPOSITION:  # Treatment today; # [facility] referral- Dx; Tongue mass # follow up  In 3 weeks /labs/MD-cbc/cmp-ca-27-29--/Taxol-Dr.B   No orders of the defined types were placed in this encounter.  All questions were answered. The patient knows to call the clinic with any problems, questions or concerns.      GCammie Sickle MD 07/16/2018 10:24 AM

## 2018-07-16 NOTE — Assessment & Plan Note (Addendum)
Left breast cancer- stage IV- ER/PR +, HER-2/neu - on Taxol chemotherapy; NOV 4th 2019- PET- overall STABLE; RLL nodule- clinically rounded atelectasis/improved; but incidental pancreatic uptake [see discussion below]. Stable.   # Continue Taxol 61m/m2; Labs today reviewed;  Acceptable for treatment today.   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  Stable  # Bil LE swelling/ededma MILD- discussed re: compression stocking/ leg-stable  # Chronic kidney disease - stage IV- creatinine2.5-stable  # ? Genetic predisposition- NEG for breast cancer deleterious genes.  Reviewed the results of the genetic testing-cystic fibrosis heterozygous deleterious gene.  # Type 2 DM- insulin dependent/stable.    # Anemia- hemoglobin today-9-10; stable.  on PO iron.   # Lobular lesion on tongue- refer to [facility]  # URI- claritin D-/mucinex.  Hold off antibiotics unless patient notes to have fevers or chills.  #  DISPOSITION:  # Treatment today; # [place] referral- Dx; Tongue mass # follow up  In 3 weeks /labs/MD-cbc/cmp-ca-27-29--/Taxol-Dr.B

## 2018-07-17 LAB — CANCER ANTIGEN 27.29: CAN 27.29: 58.5 U/mL — AB (ref 0.0–38.6)

## 2018-07-22 DIAGNOSIS — N184 Chronic kidney disease, stage 4 (severe): Secondary | ICD-10-CM | POA: Diagnosis not present

## 2018-07-22 DIAGNOSIS — D631 Anemia in chronic kidney disease: Secondary | ICD-10-CM | POA: Diagnosis not present

## 2018-07-22 DIAGNOSIS — E1122 Type 2 diabetes mellitus with diabetic chronic kidney disease: Secondary | ICD-10-CM | POA: Diagnosis not present

## 2018-07-22 DIAGNOSIS — N2581 Secondary hyperparathyroidism of renal origin: Secondary | ICD-10-CM | POA: Diagnosis not present

## 2018-07-22 DIAGNOSIS — I129 Hypertensive chronic kidney disease with stage 1 through stage 4 chronic kidney disease, or unspecified chronic kidney disease: Secondary | ICD-10-CM | POA: Diagnosis not present

## 2018-08-02 ENCOUNTER — Other Ambulatory Visit: Payer: Self-pay | Admitting: Internal Medicine

## 2018-08-02 DIAGNOSIS — C50212 Malignant neoplasm of upper-inner quadrant of left female breast: Secondary | ICD-10-CM

## 2018-08-02 DIAGNOSIS — Z170 Estrogen receptor positive status [ER+]: Principal | ICD-10-CM

## 2018-08-06 ENCOUNTER — Inpatient Hospital Stay: Payer: Medicare Other | Attending: Internal Medicine

## 2018-08-06 ENCOUNTER — Inpatient Hospital Stay (HOSPITAL_BASED_OUTPATIENT_CLINIC_OR_DEPARTMENT_OTHER): Payer: Medicare Other | Admitting: Internal Medicine

## 2018-08-06 ENCOUNTER — Inpatient Hospital Stay: Payer: Medicare Other

## 2018-08-06 ENCOUNTER — Encounter: Payer: Self-pay | Admitting: Internal Medicine

## 2018-08-06 VITALS — BP 146/87 | HR 71 | Temp 97.8°F | Resp 16 | Wt 212.0 lb

## 2018-08-06 DIAGNOSIS — E1122 Type 2 diabetes mellitus with diabetic chronic kidney disease: Secondary | ICD-10-CM | POA: Diagnosis not present

## 2018-08-06 DIAGNOSIS — R5381 Other malaise: Secondary | ICD-10-CM | POA: Diagnosis not present

## 2018-08-06 DIAGNOSIS — I509 Heart failure, unspecified: Secondary | ICD-10-CM

## 2018-08-06 DIAGNOSIS — Z803 Family history of malignant neoplasm of breast: Secondary | ICD-10-CM | POA: Diagnosis not present

## 2018-08-06 DIAGNOSIS — Z794 Long term (current) use of insulin: Secondary | ICD-10-CM | POA: Diagnosis not present

## 2018-08-06 DIAGNOSIS — Z87891 Personal history of nicotine dependence: Secondary | ICD-10-CM | POA: Diagnosis not present

## 2018-08-06 DIAGNOSIS — Z79899 Other long term (current) drug therapy: Secondary | ICD-10-CM | POA: Insufficient documentation

## 2018-08-06 DIAGNOSIS — Z8042 Family history of malignant neoplasm of prostate: Secondary | ICD-10-CM

## 2018-08-06 DIAGNOSIS — R200 Anesthesia of skin: Secondary | ICD-10-CM | POA: Diagnosis not present

## 2018-08-06 DIAGNOSIS — Z79810 Long term (current) use of selective estrogen receptor modulators (SERMs): Secondary | ICD-10-CM

## 2018-08-06 DIAGNOSIS — Z7982 Long term (current) use of aspirin: Secondary | ICD-10-CM | POA: Diagnosis not present

## 2018-08-06 DIAGNOSIS — R5383 Other fatigue: Secondary | ICD-10-CM | POA: Diagnosis not present

## 2018-08-06 DIAGNOSIS — C50212 Malignant neoplasm of upper-inner quadrant of left female breast: Secondary | ICD-10-CM | POA: Diagnosis not present

## 2018-08-06 DIAGNOSIS — N184 Chronic kidney disease, stage 4 (severe): Secondary | ICD-10-CM | POA: Insufficient documentation

## 2018-08-06 DIAGNOSIS — Z170 Estrogen receptor positive status [ER+]: Secondary | ICD-10-CM | POA: Diagnosis not present

## 2018-08-06 DIAGNOSIS — D649 Anemia, unspecified: Secondary | ICD-10-CM

## 2018-08-06 DIAGNOSIS — I130 Hypertensive heart and chronic kidney disease with heart failure and stage 1 through stage 4 chronic kidney disease, or unspecified chronic kidney disease: Secondary | ICD-10-CM | POA: Insufficient documentation

## 2018-08-06 DIAGNOSIS — R202 Paresthesia of skin: Secondary | ICD-10-CM | POA: Insufficient documentation

## 2018-08-06 DIAGNOSIS — K869 Disease of pancreas, unspecified: Secondary | ICD-10-CM

## 2018-08-06 DIAGNOSIS — Z5111 Encounter for antineoplastic chemotherapy: Secondary | ICD-10-CM | POA: Diagnosis not present

## 2018-08-06 DIAGNOSIS — R600 Localized edema: Secondary | ICD-10-CM | POA: Diagnosis not present

## 2018-08-06 DIAGNOSIS — Z800 Family history of malignant neoplasm of digestive organs: Secondary | ICD-10-CM | POA: Diagnosis not present

## 2018-08-06 DIAGNOSIS — K148 Other diseases of tongue: Secondary | ICD-10-CM

## 2018-08-06 DIAGNOSIS — R220 Localized swelling, mass and lump, head: Secondary | ICD-10-CM

## 2018-08-06 LAB — COMPREHENSIVE METABOLIC PANEL
ALK PHOS: 80 U/L (ref 38–126)
ALT: 16 U/L (ref 0–44)
ANION GAP: 10 (ref 5–15)
AST: 18 U/L (ref 15–41)
Albumin: 3.7 g/dL (ref 3.5–5.0)
BUN: 45 mg/dL — ABNORMAL HIGH (ref 8–23)
CO2: 23 mmol/L (ref 22–32)
Calcium: 8.9 mg/dL (ref 8.9–10.3)
Chloride: 103 mmol/L (ref 98–111)
Creatinine, Ser: 3.49 mg/dL — ABNORMAL HIGH (ref 0.44–1.00)
GFR calc Af Amer: 16 mL/min — ABNORMAL LOW (ref >60)
GFR calc non Af Amer: 13 mL/min — ABNORMAL LOW (ref >60)
Glucose, Bld: 126 mg/dL — ABNORMAL HIGH (ref 70–99)
Potassium: 4.1 mmol/L (ref 3.5–5.1)
SODIUM: 136 mmol/L (ref 135–145)
Total Bilirubin: 0.5 mg/dL (ref 0.3–1.2)
Total Protein: 7.5 g/dL (ref 6.5–8.1)

## 2018-08-06 LAB — CBC WITH DIFFERENTIAL/PLATELET
Abs Immature Granulocytes: 0.07 10*3/uL (ref 0.00–0.07)
Basophils Absolute: 0 10*3/uL (ref 0.0–0.1)
Basophils Relative: 0 %
Eosinophils Absolute: 0.1 10*3/uL (ref 0.0–0.5)
Eosinophils Relative: 1 %
HEMATOCRIT: 31.3 % — AB (ref 36.0–46.0)
Hemoglobin: 10.3 g/dL — ABNORMAL LOW (ref 12.0–15.0)
IMMATURE GRANULOCYTES: 1 %
LYMPHS ABS: 1.1 10*3/uL (ref 0.7–4.0)
LYMPHS PCT: 13 %
MCH: 29.5 pg (ref 26.0–34.0)
MCHC: 32.9 g/dL (ref 30.0–36.0)
MCV: 89.7 fL (ref 80.0–100.0)
Monocytes Absolute: 0.9 10*3/uL (ref 0.1–1.0)
Monocytes Relative: 10 %
Neutro Abs: 6.5 10*3/uL (ref 1.7–7.7)
Neutrophils Relative %: 75 %
Platelets: 313 10*3/uL (ref 150–400)
RBC: 3.49 MIL/uL — ABNORMAL LOW (ref 3.87–5.11)
RDW: 12.6 % (ref 11.5–15.5)
WBC: 8.6 10*3/uL (ref 4.0–10.5)
nRBC: 0 % (ref 0.0–0.2)

## 2018-08-06 MED ORDER — SODIUM CHLORIDE 0.9 % IV SOLN
Freq: Once | INTRAVENOUS | Status: AC
  Administered 2018-08-06: 10:00:00 via INTRAVENOUS
  Filled 2018-08-06: qty 250

## 2018-08-06 MED ORDER — HEPARIN SOD (PORK) LOCK FLUSH 100 UNIT/ML IV SOLN
500.0000 [IU] | Freq: Once | INTRAVENOUS | Status: AC | PRN
  Administered 2018-08-06: 500 [IU]

## 2018-08-06 MED ORDER — DIPHENHYDRAMINE HCL 50 MG/ML IJ SOLN
50.0000 mg | Freq: Once | INTRAMUSCULAR | Status: AC
  Administered 2018-08-06: 50 mg via INTRAVENOUS
  Filled 2018-08-06: qty 1

## 2018-08-06 MED ORDER — SODIUM CHLORIDE 0.9 % IV SOLN
60.0000 mg/m2 | Freq: Once | INTRAVENOUS | Status: AC
  Administered 2018-08-06: 114 mg via INTRAVENOUS
  Filled 2018-08-06: qty 19

## 2018-08-06 MED ORDER — FAMOTIDINE IN NACL 20-0.9 MG/50ML-% IV SOLN
20.0000 mg | Freq: Once | INTRAVENOUS | Status: AC
  Administered 2018-08-06: 20 mg via INTRAVENOUS
  Filled 2018-08-06: qty 50

## 2018-08-06 MED ORDER — HEPARIN SOD (PORK) LOCK FLUSH 100 UNIT/ML IV SOLN
500.0000 [IU] | Freq: Once | INTRAVENOUS | Status: DC
  Filled 2018-08-06: qty 5

## 2018-08-06 MED ORDER — DEXAMETHASONE SODIUM PHOSPHATE 10 MG/ML IJ SOLN
8.0000 mg | Freq: Once | INTRAMUSCULAR | Status: AC
  Administered 2018-08-06: 8 mg via INTRAVENOUS
  Filled 2018-08-06: qty 1

## 2018-08-06 MED ORDER — SODIUM CHLORIDE 0.9% FLUSH
10.0000 mL | INTRAVENOUS | Status: DC | PRN
  Administered 2018-08-06: 10 mL via INTRAVENOUS
  Filled 2018-08-06: qty 10

## 2018-08-06 NOTE — Assessment & Plan Note (Addendum)
Left breast cancer- stage IV- ER/PR +, HER-2/neu - on Taxol chemotherapy; NOV 4th 2019- PET- overall STABLE; RLL nodule- clinically rounded atelectasis/improved; but incidental pancreatic uptake [hx of pancreatitis]. Stable.   # Continue Taxol 39m/m2; Labs today reviewed;  Acceptable for treatment today. Will order CT/imaging at next visit.   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   # Bil LE swelling/ededma MILD- discussed re: compression stocking/ leg-STABLE.   # Chronic kidney disease - stage IV- creatinine2.5-stable.  # Type 2 DM- insulin dependent/stable.    # Anemia- hemoglobin today-9-10; stable.  on PO iron.   # Lobular lesion on tongue[facility]   #  DISPOSITION:  # Treatment today; # 1 week-cbc/bmp-Taxol # [facility] referral- Dx; Tongue mass # follow up in 2 weeks/labs/MD-cbc/cmp-ca-27-29--/Taxol-Dr.B

## 2018-08-06 NOTE — Progress Notes (Signed)
[name]  Patient Care Team: [name], [name], [name]; [name]; [name]; [name]; EBUS- Positive Paratrac/subcarinal LN s/p ? Taxotere [in [place]; [name]; [name];[name]; [name]; suv 3]; [name]; [name]; [name];  [name]; Ant R iliac bone; [name]; [name]; [name]; [name]; [name]; mild left sided weakness]  # GENETIC TESTING/COUNSELLING: HETEROZYGOUS Cystic Fibrosis Gene [explains hx of recurrent pancreatitis]  # MOLECULAR TESTING: NA  ------------------------------------------------   DIAGNOSIS: [ 2015] BREAST CA; [name]; [name] ;GOALS: Palliative  CURRENT/MOST RECENT THERAPY [ April 2019] TAXOL      Carcinoma of upper-inner quadrant of left breast in female, estrogen receptor positive ([place])      INTERVAL HISTORY:  [name] 63 y.o.  female pleasant patient above history of ER PR positive HER-2 negative breast cancer on Taxol is here for follow-up.  Patient denies any worsening tingling and numbness of the extremities.   Her appetite is good.  She had good  holidays.  No new shortness of breath or cough.  No worsening swelling in the legs.   Review of Systems  Constitutional: Positive for malaise/fatigue. Negative for chills, diaphoresis, [name], hemoptysis, sputum production, [name], palpitations and orthopnea.  Gastrointestinal: Negative for abdominal pain, blood in stool, constipation, diarrhea, heartburn, melena, nausea and vomiting.  Genitourinary: Negative for dysuria, frequency and urgency.  Musculoskeletal: Negative for back pain and joint pain.  Skin: Negative.  Negative for itching and rash.  Neurological: Positive for tingling. Negative for dizziness, focal weakness, weakness and headaches.  Endo/Heme/Allergies: Does not bruise/bleed easily.  Psychiatric/Behavioral: Negative for depression. The patient is not nervous/anxious and does not have insomnia.       PAST MEDICAL HISTORY :  Past Medical History:  Diagnosis Date  . Asthma   . Cancer ([place]) 03/10/2018   Per NM PET order. Carcinoma of upper-inner quadrant of left breast in female, estrogen receptor positive .  Marland Kitchen CHF (congestive heart failure) ([place]) 1997  . CKD (chronic kidney disease)   . Depression   . Diabetes mellitus, type 2 ([place])   . Family history of breast cancer   . Family history of colon cancer   . Family history of ovarian cancer   . Family history of pancreatic cancer   . Family history of prostate cancer   . Family history of stomach cancer   . Hair loss   . History of left breast cancer 05/29/14  . History of  partial hysterectomy 12/31/2016   Per patient.  Has not had a period in years.  Had a partial hysterectomy years ago.  . Obesity   . Pancreatitis 1997  . Stroke [facility]) 2010   with mild left arm weakness    PAST SURGICAL HISTORY :   Past  Surgical History:  Procedure Laterality Date  . CESAREAN SECTION    . CHOLECYSTECTOMY    . PARTIAL HYSTERECTOMY  12/31/2016   Per patient, she has not had a period in years since she had a partial hysterectomy.    FAMILY HISTORY :   Family History  Problem Relation Age of Onset  . Ovarian cancer Mother 69  . Diabetes Mother   . Hypertension Mother   . COPD Father   . Hypertension Father   . Colon cancer Father 96  . Diabetes Sister   . Breast cancer Sister 5       bilateral  . Diabetes Brother   . Leukemia Maternal Aunt   . Pancreatic cancer Paternal Aunt 23  . Pancreatic cancer Paternal Uncle   . Colon cancer Paternal Uncle   . Stomach cancer Maternal Grandfather 51  . Throat cancer Paternal Grandmother   . Breast cancer Maternal Aunt 80  . Colon cancer Maternal Aunt   . Bone cancer Maternal Aunt   . Breast cancer Paternal Aunt        dx >50  . Prostate cancer Paternal Uncle   . Pancreatic cancer Paternal Uncle   . Throat cancer Paternal Uncle   . Lung cancer Paternal Uncle   . Stomach cancer Paternal Uncle   . Brain cancer Paternal Aunt   . Cancer Cousin        liver, kidney  . Prostate cancer Cousin        meastatic  . Lung cancer Other     SOCIAL HISTORY:   Social History   Tobacco Use  . Smoking status: Former Smoker    Packs/day: 0.50    Years: 1.00    Pack years: 0.50    Types: Cigarettes  . Smokeless tobacco: Never Used  Substance Use Topics  . Alcohol use: No    Alcohol/week: 0.0 standard drinks  . Drug use: No    ALLERGIES:  has No Known Allergies.  MEDICATIONS:  Current Outpatient Medications  Medication Sig Dispense Refill  . acetaminophen-codeine (TYLENOL #4) 300-60 MG tablet Take 2 tablets by mouth every 6 (six) hours as needed for moderate pain.     Marland Kitchen albuterol (PROAIR HFA) 108 (90 BASE) MCG/ACT inhaler Inhale 1 puff into the lungs every 6 (six) hours as needed for shortness of breath.     Marland Kitchen albuterol (PROVENTIL) (2.5 MG/3ML) 0.083%  nebulizer solution Inhale 2.5 mg into the lungs every 6 (six) hours as needed for shortness of breath.     . ALPRAZolam (XANAX) 0.5 MG tablet Take 0.5 mg by mouth at bedtime as needed for anxiety or sleep.     Marland Kitchen amLODipine (NORVASC) 10 MG tablet Take 10 mg by mouth daily.     Marland Kitchen aspirin EC 81 MG tablet Take 81 mg by mouth once.     Marland Kitchen atenolol (TENORMIN) 100 MG tablet TAKE 1 TABLETS BY MOUTH TWICE DAILY    . B-D ULTRA-FINE 33 LANCETS MISC Use 1 each 2 (two) times daily.    . B-D ULTRAFINE III SHORT PEN 31G X 8 MM MISC     . bumetanide (BUMEX) 0.5 MG tablet TAKE 1 TABLET BY MOUTH  TWICE DAILY    . calcitRIOL (ROCALTROL) 0.25 MCG capsule Take 0.25 mcg by mouth 3 (three) times a week.    . Cinnamon 500 MG capsule Take 500 mg by mouth daily.     . cloNIDine (CATAPRES) 0.2 MG tablet Take 0.2 mg by mouth 2 (two) times daily.     . enalapril (VASOTEC) 20 MG tablet Take 20 mg by mouth 2 (two) times daily.     . ferrous sulfate 325 (65 FE) MG tablet Take 325 mg by mouth 2 (two) times daily with a meal.    . FLUoxetine (PROZAC) 20 MG capsule Take 20 mg by mouth 2 (two) times daily.     Marland Kitchen gabapentin (NEURONTIN) 100 MG capsule Take 1 capsule (100 mg total) by mouth at bedtime. 30 capsule 2  . glucose blood (ONE TOUCH ULTRA TEST) test strip Use 1 each 2 (two) times daily. Use as instructed.    . glyBURIDE (DIABETA) 5 MG tablet Take 5 mg by mouth daily with breakfast.     . LEVEMIR FLEXTOUCH 100 UNIT/ML Pen Inject 55 Units into the skin daily.     . metoprolol tartrate (LOPRESSOR) 25 MG tablet TAKE 1 TABLET BY MOUTH TWICE DAILY 60 tablet 0  . mometasone (NASONEX) 50 MCG/ACT nasal spray Place 2 sprays into the nose daily as needed.     Marland Kitchen NOVOLOG FLEXPEN 100 UNIT/ML FlexPen Inject 7 Units into the skin 2 (two) times daily at 8 am and 10 pm.     . ondansetron (ZOFRAN) 8 MG tablet One pill every 8 hours as needed for nausea/vomitting. 40 tablet 1  . prochlorperazine (COMPAZINE) 10 MG tablet Take 1 tablet (10 mg  total) by mouth every 6 (six) hours as needed for nausea or vomiting. Please note change in strength 60 tablet 4  . salmeterol (SEREVENT) 50 MCG/DOSE diskus inhaler Inhale 1 puff into the lungs 2 (two) times daily.    . simvastatin (ZOCOR) 20 MG tablet Take 20 mg by mouth daily at 6 PM.     . vitamin B-12 (CYANOCOBALAMIN) 1000 MCG tablet Take 1,000 mcg by mouth daily.     No current facility-administered medications for this visit.    Facility-Administered Medications Ordered in Other Visits  Medication Dose Route Frequency Provider Last Rate Last Dose  . sodium chloride flush (NS) 0.9 % injection 10 mL  10 mL Intravenous PRN Cammie Sickle, MD   10 mL at 01/30/16 1054    PHYSICAL EXAMINATION: ECOG PERFORMANCE STATUS: 1 - Symptomatic but completely ambulatory  BP (!) 146/87 (BP Location: Left Arm, Patient Position: Sitting, Cuff Size: Normal)   Pulse 71   Temp 97.8 F (36.6 C) (Tympanic)   Resp 16   Wt 212 lb (96.2 kg)   BMI 38.78 kg/m   Filed Weights   08/06/18 0855  Weight: 212 lb (96.2 kg)    Physical Exam  Constitutional: She is oriented to person, place, and time and well-developed, well-nourished, and in no distress.  She is alone.  HENT:  Head: Normocephalic and atraumatic.  Mouth/Throat: Oropharynx is clear and moist. No oropharyngeal exudate.  A polypoid approximate 2 cm lesion noted dorsal surface of the tongue.  No active bleeding.  Eyes: Pupils are equal, round, and reactive to light.  Neck: Normal range of motion. Neck supple.  Cardiovascular: Normal rate and regular rhythm.  Pulmonary/Chest: No respiratory distress. She has no wheezes.  Abdominal: Soft. Bowel sounds are normal. She exhibits no distension and no mass.  There is no abdominal tenderness. There is no rebound and no guarding.  Musculoskeletal: Normal range of motion.        General: No tenderness.  Neurological: She is alert and oriented to person, place, and time.  Skin: Skin is warm.   Psychiatric: Affect normal.       LABORATORY DATA:  I have reviewed the data as listed    Component Value Date/Time   NA 136 08/06/2018 0811   NA 130 (L) 06/06/2014 1102   K 4.1 08/06/2018 0811   K 3.9 06/06/2014 1102   CL 103 08/06/2018 0811   CL 95 (L) 06/06/2014 1102   CO2 23 08/06/2018 0811   CO2 28 06/06/2014 1102   GLUCOSE 126 (H) 08/06/2018 0811   GLUCOSE 349 (H) 06/06/2014 1102   BUN 45 (H) 08/06/2018 0811   BUN 17 06/06/2014 1102   CREATININE 3.49 (H) 08/06/2018 0811   CREATININE 1.63 (H) 06/06/2014 1102   CALCIUM 8.9 08/06/2018 0811   CALCIUM 9.2 06/06/2014 1102   PROT 7.5 08/06/2018 0811   PROT 8.2 06/06/2014 1102   ALBUMIN 3.7 08/06/2018 0811   ALBUMIN 3.3 (L) 06/06/2014 1102   AST 18 08/06/2018 0811   AST 7 (L) 06/06/2014 1102   ALT 16 08/06/2018 0811   ALT 12 (L) 06/06/2014 1102   ALKPHOS 80 08/06/2018 0811   ALKPHOS 74 06/06/2014 1102   BILITOT 0.5 08/06/2018 0811   BILITOT 0.4 06/06/2014 1102   GFRNONAA 13 (L) 08/06/2018 0811   GFRNONAA 35 (L) 06/06/2014 1102   GFRAA 16 (L) 08/06/2018 0811   GFRAA 42 (L) 06/06/2014 1102    No results found for: SPEP, UPEP  Lab Results  Component Value Date   WBC 8.6 08/06/2018   NEUTROABS 6.5 08/06/2018   HGB 10.3 (L) 08/06/2018   HCT 31.3 (L) 08/06/2018   MCV 89.7 08/06/2018   PLT 313 08/06/2018      Chemistry      Component Value Date/Time   NA 136 08/06/2018 0811   NA 130 (L) 06/06/2014 1102   K 4.1 08/06/2018 0811   K 3.9 06/06/2014 1102   CL 103 08/06/2018 0811   CL 95 (L) 06/06/2014 1102   CO2 23 08/06/2018 0811   CO2 28 06/06/2014 1102   BUN 45 (H) 08/06/2018 0811   BUN 17 06/06/2014 1102   CREATININE 3.49 (H) 08/06/2018 0811   CREATININE 1.63 (H) 06/06/2014 1102      Component Value Date/Time   CALCIUM 8.9 08/06/2018 0811   CALCIUM 9.2 06/06/2014 1102   ALKPHOS 80 08/06/2018 0811   ALKPHOS 74 06/06/2014 1102   AST 18 08/06/2018 0811   AST 7 (L) 06/06/2014 1102   ALT 16 08/06/2018  0811   ALT 12 (L) 06/06/2014 1102   BILITOT 0.5 08/06/2018 0811   BILITOT 0.4 06/06/2014 1102       RADIOGRAPHIC STUDIES: I have personally reviewed the radiological images as listed and agreed with the findings in the report. No results found.   ASSESSMENT & PLAN:  Carcinoma of upper-inner quadrant of left breast in female, estrogen receptor positive ([place]) Left breast cancer- stage IV- ER/PR +, HER-2/neu - on Taxol chemotherapy; NOV 4th 2019- PET- overall STABLE; RLL nodule- clinically rounded atelectasis/improved; but incidental pancreatic uptake [hx of pancreatitis]. Stable.   # Continue Taxol 53m/m2; Labs today reviewed;  Acceptable for treatment today. Will order CT/imaging at next visit.   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   # Bil  LE swelling/ededma MILD- discussed re: compression stocking/ leg-STABLE.   # Chronic kidney disease - stage IV- creatinine2.5-stable.  # Type 2 DM- insulin dependent/stable.    # Anemia- hemoglobin today-9-10; stable.  on PO iron.   # Lobular lesion on tongue- will make referral to  [facility].   #  DISPOSITION:  # Treatment today; # 1 week-cbc/bmp-Taxol # [facility] referral- Dx; Tongue mass # follow up in 2 weeks/labs/MD-cbc/cmp-ca-27-29--/Taxol-Dr.B   Orders Placed This Encounter  Procedures  . CBC with Differential    Standing Status:   Future    Standing Expiration Date:   08/07/2019  . Basic metabolic panel    Standing Status:   Future    Standing Expiration Date:   08/07/2019  . CBC with Differential    Standing Status:   Future    Standing Expiration Date:   08/07/2019  . Comprehensive metabolic panel    Standing Status:   Future    Standing Expiration Date:   08/07/2019  . Cancer antigen 27.29    Standing Status:   Future    Standing Expiration Date:   08/07/2019  . Ambulatory referral to ENT    Referral Priority:   Urgent    Referral Type:   Consultation    Referral Reason:   Specialty Services Required     Referred to Provider:   Beverly Gust, MD    Requested Specialty:   Otolaryngology    Number of Visits Requested:   1   All questions were answered. The patient knows to call the clinic with any problems, questions or concerns.      Cammie Sickle, MD 08/10/2018 2:34 PM

## 2018-08-07 LAB — CANCER ANTIGEN 27.29: CA 27.29: 55 U/mL — ABNORMAL HIGH (ref 0.0–38.6)

## 2018-08-10 DIAGNOSIS — D3702 Neoplasm of uncertain behavior of tongue: Secondary | ICD-10-CM | POA: Diagnosis not present

## 2018-08-13 ENCOUNTER — Inpatient Hospital Stay: Payer: Medicare Other

## 2018-08-13 VITALS — BP 133/82 | HR 72 | Temp 97.4°F | Resp 18 | Wt 211.0 lb

## 2018-08-13 DIAGNOSIS — D649 Anemia, unspecified: Secondary | ICD-10-CM | POA: Diagnosis not present

## 2018-08-13 DIAGNOSIS — Z170 Estrogen receptor positive status [ER+]: Principal | ICD-10-CM

## 2018-08-13 DIAGNOSIS — Z79810 Long term (current) use of selective estrogen receptor modulators (SERMs): Secondary | ICD-10-CM | POA: Diagnosis not present

## 2018-08-13 DIAGNOSIS — C50212 Malignant neoplasm of upper-inner quadrant of left female breast: Secondary | ICD-10-CM

## 2018-08-13 DIAGNOSIS — K869 Disease of pancreas, unspecified: Secondary | ICD-10-CM | POA: Diagnosis not present

## 2018-08-13 DIAGNOSIS — Z5111 Encounter for antineoplastic chemotherapy: Secondary | ICD-10-CM | POA: Diagnosis not present

## 2018-08-13 LAB — COMPREHENSIVE METABOLIC PANEL
ALBUMIN: 3.7 g/dL (ref 3.5–5.0)
ALT: 16 U/L (ref 0–44)
AST: 17 U/L (ref 15–41)
Alkaline Phosphatase: 72 U/L (ref 38–126)
Anion gap: 6 (ref 5–15)
BUN: 36 mg/dL — ABNORMAL HIGH (ref 8–23)
CO2: 25 mmol/L (ref 22–32)
Calcium: 8.3 mg/dL — ABNORMAL LOW (ref 8.9–10.3)
Chloride: 106 mmol/L (ref 98–111)
Creatinine, Ser: 2.97 mg/dL — ABNORMAL HIGH (ref 0.44–1.00)
GFR calc non Af Amer: 16 mL/min — ABNORMAL LOW (ref >60)
GFR, EST AFRICAN AMERICAN: 19 mL/min — AB (ref >60)
Glucose, Bld: 182 mg/dL — ABNORMAL HIGH (ref 70–99)
Potassium: 4.5 mmol/L (ref 3.5–5.1)
Sodium: 137 mmol/L (ref 135–145)
Total Bilirubin: 0.5 mg/dL (ref 0.3–1.2)
Total Protein: 7.1 g/dL (ref 6.5–8.1)

## 2018-08-13 LAB — CBC WITH DIFFERENTIAL/PLATELET
ABS IMMATURE GRANULOCYTES: 0.05 10*3/uL (ref 0.00–0.07)
Basophils Absolute: 0 10*3/uL (ref 0.0–0.1)
Basophils Relative: 0 %
Eosinophils Absolute: 0.1 10*3/uL (ref 0.0–0.5)
Eosinophils Relative: 1 %
HCT: 29.3 % — ABNORMAL LOW (ref 36.0–46.0)
Hemoglobin: 9.7 g/dL — ABNORMAL LOW (ref 12.0–15.0)
Immature Granulocytes: 1 %
Lymphocytes Relative: 14 %
Lymphs Abs: 1 10*3/uL (ref 0.7–4.0)
MCH: 29.8 pg (ref 26.0–34.0)
MCHC: 33.1 g/dL (ref 30.0–36.0)
MCV: 89.9 fL (ref 80.0–100.0)
Monocytes Absolute: 0.4 10*3/uL (ref 0.1–1.0)
Monocytes Relative: 7 %
NEUTROS ABS: 5.3 10*3/uL (ref 1.7–7.7)
Neutrophils Relative %: 77 %
Platelets: 312 10*3/uL (ref 150–400)
RBC: 3.26 MIL/uL — ABNORMAL LOW (ref 3.87–5.11)
RDW: 12.6 % (ref 11.5–15.5)
WBC: 6.8 10*3/uL (ref 4.0–10.5)
nRBC: 0 % (ref 0.0–0.2)

## 2018-08-13 MED ORDER — DIPHENHYDRAMINE HCL 50 MG/ML IJ SOLN
50.0000 mg | Freq: Once | INTRAMUSCULAR | Status: AC
  Administered 2018-08-13: 50 mg via INTRAVENOUS
  Filled 2018-08-13: qty 1

## 2018-08-13 MED ORDER — SODIUM CHLORIDE 0.9 % IV SOLN
60.0000 mg/m2 | Freq: Once | INTRAVENOUS | Status: AC
  Administered 2018-08-13: 114 mg via INTRAVENOUS
  Filled 2018-08-13: qty 19

## 2018-08-13 MED ORDER — DEXAMETHASONE SODIUM PHOSPHATE 10 MG/ML IJ SOLN
8.0000 mg | Freq: Once | INTRAMUSCULAR | Status: AC
  Administered 2018-08-13: 8 mg via INTRAVENOUS
  Filled 2018-08-13: qty 1

## 2018-08-13 MED ORDER — FAMOTIDINE IN NACL 20-0.9 MG/50ML-% IV SOLN
20.0000 mg | Freq: Once | INTRAVENOUS | Status: AC
  Administered 2018-08-13: 20 mg via INTRAVENOUS
  Filled 2018-08-13: qty 50

## 2018-08-13 MED ORDER — SODIUM CHLORIDE 0.9% FLUSH
10.0000 mL | INTRAVENOUS | Status: DC | PRN
  Administered 2018-08-13: 10 mL
  Filled 2018-08-13: qty 10

## 2018-08-13 MED ORDER — HEPARIN SOD (PORK) LOCK FLUSH 100 UNIT/ML IV SOLN
500.0000 [IU] | Freq: Once | INTRAVENOUS | Status: AC | PRN
  Administered 2018-08-13: 500 [IU]
  Filled 2018-08-13: qty 5

## 2018-08-13 MED ORDER — SODIUM CHLORIDE 0.9 % IV SOLN
Freq: Once | INTRAVENOUS | Status: AC
  Administered 2018-08-13: 09:00:00 via INTRAVENOUS
  Filled 2018-08-13: qty 250

## 2018-08-16 ENCOUNTER — Other Ambulatory Visit: Payer: Self-pay

## 2018-08-16 ENCOUNTER — Encounter
Admission: RE | Admit: 2018-08-16 | Discharge: 2018-08-16 | Disposition: A | Discharge status: Home / Self Care | Payer: Medicare Other | Source: Ambulatory Visit | Attending: Unknown Physician Specialty | Admitting: Unknown Physician Specialty

## 2018-08-16 DIAGNOSIS — Z8673 Personal history of transient ischemic attack (TIA), and cerebral infarction without residual deficits: Secondary | ICD-10-CM | POA: Diagnosis not present

## 2018-08-16 DIAGNOSIS — Z01818 Encounter for other preprocedural examination: Secondary | ICD-10-CM

## 2018-08-16 DIAGNOSIS — F329 Major depressive disorder, single episode, unspecified: Secondary | ICD-10-CM | POA: Diagnosis not present

## 2018-08-16 DIAGNOSIS — N189 Chronic kidney disease, unspecified: Secondary | ICD-10-CM | POA: Diagnosis not present

## 2018-08-16 DIAGNOSIS — I509 Heart failure, unspecified: Secondary | ICD-10-CM | POA: Diagnosis not present

## 2018-08-16 DIAGNOSIS — K149 Disease of tongue, unspecified: Secondary | ICD-10-CM | POA: Diagnosis not present

## 2018-08-16 DIAGNOSIS — L980 Pyogenic granuloma: Secondary | ICD-10-CM | POA: Diagnosis not present

## 2018-08-16 DIAGNOSIS — Z794 Long term (current) use of insulin: Secondary | ICD-10-CM | POA: Diagnosis not present

## 2018-08-16 DIAGNOSIS — G4733 Obstructive sleep apnea (adult) (pediatric): Secondary | ICD-10-CM | POA: Diagnosis not present

## 2018-08-16 DIAGNOSIS — Z853 Personal history of malignant neoplasm of breast: Secondary | ICD-10-CM | POA: Diagnosis not present

## 2018-08-16 DIAGNOSIS — I130 Hypertensive heart and chronic kidney disease with heart failure and stage 1 through stage 4 chronic kidney disease, or unspecified chronic kidney disease: Secondary | ICD-10-CM | POA: Diagnosis not present

## 2018-08-16 DIAGNOSIS — Z79899 Other long term (current) drug therapy: Secondary | ICD-10-CM | POA: Diagnosis not present

## 2018-08-16 DIAGNOSIS — Z87891 Personal history of nicotine dependence: Secondary | ICD-10-CM | POA: Diagnosis not present

## 2018-08-16 DIAGNOSIS — E119 Type 2 diabetes mellitus without complications: Secondary | ICD-10-CM | POA: Diagnosis not present

## 2018-08-16 DIAGNOSIS — E1122 Type 2 diabetes mellitus with diabetic chronic kidney disease: Secondary | ICD-10-CM | POA: Diagnosis not present

## 2018-08-16 HISTORY — DX: Anemia, unspecified: D64.9

## 2018-08-16 HISTORY — DX: Essential (primary) hypertension: I10

## 2018-08-16 NOTE — Patient Instructions (Addendum)
  Your procedure is scheduled on: Tuesday August 17, 2018 Report to Same Day Surgery 2nd floor [facility] Entrance-take elevator on left to 2nd floor.  Check in with surgery information desk.) To find out your arrival time, call [phone] 1:00-3:00 PM today    Remember: Instructions that are not followed completely may result in serious medical risk, up to and including death, or upon the discretion of your surgeon and anesthesiologist your surgery may need to be rescheduled.    __x__ 1. Do not eat any food (including mints, candies, chewing gum) after midnight the night before your procedure. You may drink water up to 2 hours before you are scheduled to arrive at the hospital for your procedure.  Do not drink anything within 2 hours of your scheduled arrival to the hospital.    __x__ 2. No Alcohol for 24 hours before or after surgery.   __x__ 3. No Smoking or e-cigarettes for 24 hours before surgery.  Do not use any chewable tobacco products for at least 6 hours before surgery.   __x__ 4. Notify your doctor if there is any change in your medical condition (cold, fever, infections).   __x__ 5. On the morning of surgery brush your teeth with toothpaste and water.  You may rinse your mouth with mouthwash if you wish.  Do not swallow any toothpaste or mouthwash.   __x__ Use antibacterial soap or body wash such as Dial to shower/bathe on the day of surgery.   Do not wear jewelry, make-up, hairpins, clips or nail polish on the day of surgery.    Do not wear lotions, powders, deodorant, or perfumes.   Do not shave below the face/neck 48 hours prior to surgery.   Do not bring valuables to the hospital.    [facility] is not responsible for any belongings or valuables.               Contacts, dentures or bridgework may not be worn into surgery.  For patients discharged on the day of surgery, you will NOT be permitted to drive yourself home.  You must have a responsible adult  with you for 24 hours after surgery.  __x__ Take these medications on the morning of surgery with a SMALL SIP OF WATER:  1. Amlodipine (Norvasc)  2. Atenolol (Tenormin)  3. Clonidine (Catapres)  4. Fluoxetine (Prozac)  5. Alprazolam (Xanax)  6. Tylenol 4 if needed for pain  Do NOT take on the morning of surgery:  Insulin, Bumetanide (Bumex), Enalapril (Vasotec), Glyburide (Diabeta), Cinnamon supplement  __x__ Use inhalers on the day of surgery and bring them with you to the hospital. Salmeterol (Serevent), Albuterol (Proair)   __x__ Take 1/2 of usual insulin dose the night before surgery and no insulin on the morning of surgery.    3 units of Novolog instead of 7 units at bedtime  __x__ Follow recommendations from Cardiologist, Pulmonologist or PCP regarding stopping Aspirin, Coumadin, Plavix, Eliquis, Effient, Pradaxa, and Pletal.  __x__ TODAY: Stop Anti-inflammatories such as Advil, Ibuprofen, Motrin, Aleve, Naproxen, Naprosyn, BC/Goodies powders or aspirin products. You may continue to take Tylenol and Celebrex.   __x__ TODAY: Stop supplements until after surgery. You may continue to take Vitamin D, Vitamin B, and multivitamin.

## 2018-08-17 ENCOUNTER — Ambulatory Visit: Payer: Medicare Other | Admitting: Anesthesiology

## 2018-08-17 ENCOUNTER — Ambulatory Visit
Admission: RE | Admit: 2018-08-17 | Discharge: 2018-08-17 | Disposition: A | Discharge status: Home / Self Care | Payer: Medicare Other | Attending: Unknown Physician Specialty | Admitting: Unknown Physician Specialty

## 2018-08-17 ENCOUNTER — Encounter
Admission: RE | Disposition: A | Discharge status: Home / Self Care | Payer: Self-pay | Source: Home / Self Care | Attending: Unknown Physician Specialty

## 2018-08-17 DIAGNOSIS — F329 Major depressive disorder, single episode, unspecified: Secondary | ICD-10-CM | POA: Insufficient documentation

## 2018-08-17 DIAGNOSIS — Z853 Personal history of malignant neoplasm of breast: Secondary | ICD-10-CM | POA: Insufficient documentation

## 2018-08-17 DIAGNOSIS — I130 Hypertensive heart and chronic kidney disease with heart failure and stage 1 through stage 4 chronic kidney disease, or unspecified chronic kidney disease: Secondary | ICD-10-CM | POA: Diagnosis not present

## 2018-08-17 DIAGNOSIS — Z8673 Personal history of transient ischemic attack (TIA), and cerebral infarction without residual deficits: Secondary | ICD-10-CM | POA: Insufficient documentation

## 2018-08-17 DIAGNOSIS — G4733 Obstructive sleep apnea (adult) (pediatric): Secondary | ICD-10-CM | POA: Insufficient documentation

## 2018-08-17 DIAGNOSIS — E1122 Type 2 diabetes mellitus with diabetic chronic kidney disease: Secondary | ICD-10-CM | POA: Insufficient documentation

## 2018-08-17 DIAGNOSIS — G4730 Sleep apnea, unspecified: Secondary | ICD-10-CM | POA: Diagnosis not present

## 2018-08-17 DIAGNOSIS — K149 Disease of tongue, unspecified: Secondary | ICD-10-CM | POA: Insufficient documentation

## 2018-08-17 DIAGNOSIS — L980 Pyogenic granuloma: Secondary | ICD-10-CM | POA: Diagnosis not present

## 2018-08-17 DIAGNOSIS — K134 Granuloma and granuloma-like lesions of oral mucosa: Secondary | ICD-10-CM | POA: Diagnosis not present

## 2018-08-17 DIAGNOSIS — Z79899 Other long term (current) drug therapy: Secondary | ICD-10-CM | POA: Insufficient documentation

## 2018-08-17 DIAGNOSIS — I5022 Chronic systolic (congestive) heart failure: Secondary | ICD-10-CM | POA: Diagnosis not present

## 2018-08-17 DIAGNOSIS — Z87891 Personal history of nicotine dependence: Secondary | ICD-10-CM | POA: Insufficient documentation

## 2018-08-17 DIAGNOSIS — N189 Chronic kidney disease, unspecified: Secondary | ICD-10-CM | POA: Diagnosis not present

## 2018-08-17 DIAGNOSIS — Z794 Long term (current) use of insulin: Secondary | ICD-10-CM | POA: Insufficient documentation

## 2018-08-17 DIAGNOSIS — K148 Other diseases of tongue: Secondary | ICD-10-CM | POA: Diagnosis not present

## 2018-08-17 DIAGNOSIS — I509 Heart failure, unspecified: Secondary | ICD-10-CM | POA: Diagnosis not present

## 2018-08-17 DIAGNOSIS — E782 Mixed hyperlipidemia: Secondary | ICD-10-CM | POA: Diagnosis not present

## 2018-08-17 HISTORY — PX: EXCISION OF TONGUE LESION: SHX6434

## 2018-08-17 LAB — GLUCOSE, CAPILLARY
Glucose-Capillary: 254 mg/dL — ABNORMAL HIGH (ref 70–99)
Glucose-Capillary: 264 mg/dL — ABNORMAL HIGH (ref 70–99)

## 2018-08-17 LAB — POCT I-STAT 4, (NA,K, GLUC, HGB,HCT)
Glucose, Bld: 286 mg/dL — ABNORMAL HIGH (ref 70–99)
HCT: 30 % — ABNORMAL LOW (ref 36.0–46.0)
HEMOGLOBIN: 10.2 g/dL — AB (ref 12.0–15.0)
Potassium: 5 mmol/L (ref 3.5–5.1)
Sodium: 135 mmol/L (ref 135–145)

## 2018-08-17 SURGERY — EXCISION, LESION, TONGUE
Anesthesia: General

## 2018-08-17 MED ORDER — SUCCINYLCHOLINE CHLORIDE 20 MG/ML IJ SOLN
INTRAMUSCULAR | Status: AC
  Filled 2018-08-17: qty 1

## 2018-08-17 MED ORDER — OXYCODONE HCL 5 MG PO TABS: 5.0000 mg | ORAL_TABLET | Freq: Once | ORAL | Status: DC | PRN

## 2018-08-17 MED ORDER — PHENYLEPHRINE HCL 10 MG/ML IJ SOLN
INTRAMUSCULAR | Status: AC
  Filled 2018-08-17: qty 1

## 2018-08-17 MED ORDER — SUGAMMADEX SODIUM 200 MG/2ML IV SOLN
INTRAVENOUS | Status: DC | PRN
  Administered 2018-08-17: 200 mg via INTRAVENOUS

## 2018-08-17 MED ORDER — ROCURONIUM BROMIDE 50 MG/5ML IV SOLN
INTRAVENOUS | Status: AC
  Filled 2018-08-17: qty 1

## 2018-08-17 MED ORDER — MEPERIDINE HCL 50 MG/ML IJ SOLN: 6.2500 mg | INTRAMUSCULAR | Status: DC | PRN

## 2018-08-17 MED ORDER — SODIUM CHLORIDE 0.9 % IV SOLN
INTRAVENOUS | Status: DC
  Administered 2018-08-17: 08:00:00 via INTRAVENOUS

## 2018-08-17 MED ORDER — OXYMETAZOLINE HCL 0.05 % NA SOLN
NASAL | Status: AC
  Filled 2018-08-17: qty 15

## 2018-08-17 MED ORDER — OXYCODONE HCL 5 MG/5ML PO SOLN: 5.0000 mg | Freq: Once | ORAL | Status: DC | PRN

## 2018-08-17 MED ORDER — LIDOCAINE HCL (CARDIAC) PF 100 MG/5ML IV SOSY
PREFILLED_SYRINGE | INTRAVENOUS | Status: DC | PRN
  Administered 2018-08-17: 100 mg via INTRAVENOUS

## 2018-08-17 MED ORDER — DEXAMETHASONE SODIUM PHOSPHATE 10 MG/ML IJ SOLN
INTRAMUSCULAR | Status: DC | PRN
  Administered 2018-08-17: 4 mg via INTRAVENOUS

## 2018-08-17 MED ORDER — BUPIVACAINE-EPINEPHRINE (PF) 0.5% -1:200000 IJ SOLN
INTRAMUSCULAR | Status: DC | PRN
  Administered 2018-08-17: 3 mL via PERINEURAL

## 2018-08-17 MED ORDER — ONDANSETRON HCL 4 MG/2ML IJ SOLN
INTRAMUSCULAR | Status: AC
  Filled 2018-08-17: qty 2

## 2018-08-17 MED ORDER — ROCURONIUM BROMIDE 100 MG/10ML IV SOLN
INTRAVENOUS | Status: DC | PRN
  Administered 2018-08-17: 30 mg via INTRAVENOUS

## 2018-08-17 MED ORDER — FENTANYL CITRATE (PF) 100 MCG/2ML IJ SOLN
INTRAMUSCULAR | Status: DC | PRN
  Administered 2018-08-17: 100 ug via INTRAVENOUS

## 2018-08-17 MED ORDER — MIDAZOLAM HCL 2 MG/2ML IJ SOLN
INTRAMUSCULAR | Status: AC
  Filled 2018-08-17: qty 2

## 2018-08-17 MED ORDER — MIDAZOLAM HCL 2 MG/2ML IJ SOLN
INTRAMUSCULAR | Status: DC | PRN
  Administered 2018-08-17: 2 mg via INTRAVENOUS

## 2018-08-17 MED ORDER — DEXAMETHASONE SODIUM PHOSPHATE 10 MG/ML IJ SOLN
INTRAMUSCULAR | Status: AC
  Filled 2018-08-17: qty 1

## 2018-08-17 MED ORDER — PROPOFOL 10 MG/ML IV BOLUS
INTRAVENOUS | Status: DC | PRN
  Administered 2018-08-17: 150 mg via INTRAVENOUS

## 2018-08-17 MED ORDER — LIDOCAINE-EPINEPHRINE 1 %-1:100000 IJ SOLN
INTRAMUSCULAR | Status: AC
  Filled 2018-08-17: qty 1

## 2018-08-17 MED ORDER — FENTANYL CITRATE (PF) 100 MCG/2ML IJ SOLN
25.0000 ug | INTRAMUSCULAR | Status: DC | PRN
  Administered 2018-08-17 (×2): 25 ug via INTRAVENOUS

## 2018-08-17 MED ORDER — ONDANSETRON HCL 4 MG/2ML IJ SOLN
INTRAMUSCULAR | Status: DC | PRN
  Administered 2018-08-17: 4 mg via INTRAVENOUS

## 2018-08-17 MED ORDER — FAMOTIDINE 20 MG PO TABS
ORAL_TABLET | ORAL | Status: AC
  Administered 2018-08-17: 20 mg via ORAL
  Filled 2018-08-17: qty 1

## 2018-08-17 MED ORDER — FENTANYL CITRATE (PF) 100 MCG/2ML IJ SOLN
INTRAMUSCULAR | Status: AC
  Administered 2018-08-17: 25 ug via INTRAVENOUS
  Filled 2018-08-17: qty 2

## 2018-08-17 MED ORDER — PROMETHAZINE HCL 25 MG/ML IJ SOLN: 6.2500 mg | INTRAMUSCULAR | Status: DC | PRN

## 2018-08-17 MED ORDER — FAMOTIDINE 20 MG PO TABS
20.0000 mg | ORAL_TABLET | Freq: Once | ORAL | Status: AC
  Administered 2018-08-17: 20 mg via ORAL

## 2018-08-17 MED ORDER — FENTANYL CITRATE (PF) 100 MCG/2ML IJ SOLN
INTRAMUSCULAR | Status: AC
  Filled 2018-08-17: qty 2

## 2018-08-17 MED ORDER — BUPIVACAINE-EPINEPHRINE (PF) 0.5% -1:200000 IJ SOLN
INTRAMUSCULAR | Status: AC
  Filled 2018-08-17: qty 90

## 2018-08-17 MED ORDER — LIDOCAINE HCL (PF) 2 % IJ SOLN
INTRAMUSCULAR | Status: AC
  Filled 2018-08-17: qty 10

## 2018-08-17 MED ORDER — PROPOFOL 10 MG/ML IV BOLUS
INTRAVENOUS | Status: AC
  Filled 2018-08-17: qty 20

## 2018-08-17 MED ORDER — PHENYLEPHRINE HCL 10 MG/ML IJ SOLN
INTRAMUSCULAR | Status: DC | PRN
  Administered 2018-08-17: 50 ug via INTRAVENOUS

## 2018-08-17 SURGICAL SUPPLY — 19 items
APPLICATOR COTTON TIP 3IN (MISCELLANEOUS) ×1 IMPLANT
BLADE SURG 15 STRL LF DISP TIS (BLADE) ×1 IMPLANT
BLADE SURG 15 STRL SS (BLADE) ×2
CANISTER SUCT 1200ML W/VALVE (MISCELLANEOUS) ×3 IMPLANT
COAG SUCT 10F 3.5MM HAND CTRL (MISCELLANEOUS) ×1 IMPLANT
COAGULATOR SUCT 8FR VV (MISCELLANEOUS) ×1 IMPLANT
COVER WAND RF STERILE (DRAPES) ×1 IMPLANT
ELECT CAUTERY BLADE 6.4 (BLADE) ×3 IMPLANT
ELECT CAUTERY NDL 2.0 MIC (NEEDLE) ×1 IMPLANT
ELECT CAUTERY NEEDLE 2.0 MIC (NEEDLE) IMPLANT
ELECT REM PT RETURN 9FT ADLT (ELECTROSURGICAL) ×3
ELECTRODE REM PT RTRN 9FT ADLT (ELECTROSURGICAL) ×1 IMPLANT
GLOVE BIO SURGEON STRL SZ7.5 (GLOVE) ×3 IMPLANT
GOWN STRL REUS W/ TWL LRG LVL3 (GOWN DISPOSABLE) ×2 IMPLANT
GOWN STRL REUS W/TWL LRG LVL3 (GOWN DISPOSABLE) ×4
NS IRRIG 500ML POUR BTL (IV SOLUTION) ×3 IMPLANT
PACK HEAD/NECK (MISCELLANEOUS) ×3 IMPLANT
SUT SILK 2 0 SH (SUTURE) ×6 IMPLANT
SUT VIC AB 4-0 RB1 18 (SUTURE) ×3 IMPLANT

## 2018-08-17 NOTE — Transfer of Care (Signed)
Immediate Anesthesia Transfer of Care Note  Patient: [name]  Procedure(s) Performed: EXCISION OF TONGUE LESION WITH FROZEN SECTIONS (N/A )  Patient Location: PACU  Anesthesia Type:General  Level of Consciousness: drowsy and responds to stimulation  Airway & Oxygen Therapy: Patient Spontanous Breathing and Patient connected to face mask oxygen  Post-op Assessment: Report given to RN and Post -op Vital signs reviewed and stable  Post vital signs: Reviewed and stable  Last Vitals:  Vitals Value Taken Time  BP 129/80 08/17/2018  9:31 AM  Temp    Pulse 70 08/17/2018  9:31 AM  Resp 21 08/17/2018  9:31 AM  SpO2 100 % 08/17/2018  9:31 AM  Vitals shown include unvalidated device data.  Last Pain:  Vitals:   08/17/18 0711  TempSrc: Temporal         Complications: No apparent anesthesia complications

## 2018-08-17 NOTE — Anesthesia Postprocedure Evaluation (Signed)
Anesthesia Post Note  Patient: [name]  Procedure(s) Performed: EXCISION OF TONGUE LESION WITH FROZEN SECTIONS (N/A )  Patient location during evaluation: PACU Anesthesia Type: General Level of consciousness: awake and alert and oriented Pain management: pain level controlled Vital Signs Assessment: post-procedure vital signs reviewed and stable Respiratory status: spontaneous breathing, nonlabored ventilation and respiratory function stable Cardiovascular status: blood pressure returned to baseline and stable Postop Assessment: no signs of nausea or vomiting Anesthetic complications: no     Last Vitals:  Vitals:   08/17/18 1015 08/17/18 1039  BP: 127/74 120/73  Pulse: 71 68  Resp: 18   Temp: (!) 36.2 C   SpO2: 93% 93%    Last Pain:  Vitals:   08/17/18 1015  TempSrc: Temporal  PainSc: 0-No pain                 Therin Vetsch

## 2018-08-17 NOTE — Anesthesia Preprocedure Evaluation (Addendum)
Anesthesia Evaluation  Patient identified by MRN, date of birth, ID band Patient awake    Reviewed: Allergy & Precautions, NPO status , Patient's Chart, lab work & pertinent test results  History of Anesthesia Complications Negative for: history of anesthetic complications  Airway Mallampati: II  TM Distance: >3 FB Neck ROM: Full    Dental no notable dental hx.    Pulmonary asthma , sleep apnea (mild, does not have CPAP) , former smoker,    breath sounds clear to auscultation- rhonchi (-) wheezing      Cardiovascular hypertension, Pt. on medications +CHF (EF 35%)  (-) CAD, (-) Past MI, (-) Cardiac Stents and (-) CABG  Rhythm:Regular Rate:Normal - Systolic murmurs and - Diastolic murmurs    Neuro/Psych neg Seizures PSYCHIATRIC DISORDERS Depression CVA (mild L sided weakness), Residual Symptoms    GI/Hepatic negative GI ROS, Neg liver ROS,   Endo/Other  diabetes, Insulin Dependent  Renal/GU Renal InsufficiencyRenal disease     Musculoskeletal negative musculoskeletal ROS (+)   Abdominal (+) + obese,   Peds  Hematology  (+) anemia ,   Anesthesia Other Findings Past Medical History: No date: Anemia No date: Asthma 03/10/2018: Cancer ([place])     Comment:  Per [facility] PET order. Carcinoma of upper-inner quadrant of               left breast in female, estrogen receptor positive . 1997: CHF (congestive heart failure) ([facility]) No date: CKD (chronic kidney disease) No date: Depression No date: Diabetes mellitus, type 2 ([facility]) No date: Family history of breast cancer No date: Family history of colon cancer No date: Family history of ovarian cancer No date: Family history of pancreatic cancer No date: Family history of prostate cancer No date: Family history of stomach cancer No date: Hair loss 05/29/14: History of left breast cancer 12/31/2016: History of partial hysterectomy     Comment:  Per patient.  Has not had a period  in years.  Had a               partial hysterectomy years ago. No date: Hypertension No date: Obesity 1997: Pancreatitis 2010: Stroke ([place])     Comment:  with mild left arm weakness   Reproductive/Obstetrics                            Anesthesia Physical Anesthesia Plan  ASA: III  Anesthesia Plan: General   Post-op Pain Management:    Induction: Intravenous  PONV Risk Score and Plan: 2 and Ondansetron, Dexamethasone and Midazolam  Airway Management Planned: Nasal ETT  Additional Equipment:   Intra-op Plan:   Post-operative Plan: Extubation in OR  Informed Consent: I have reviewed the patients History and Physical, chart, labs and discussed the procedure including the risks, benefits and alternatives for the proposed anesthesia with the patient or authorized representative who has indicated his/her understanding and acceptance.   Dental advisory given  Plan Discussed with: CRNA and Anesthesiologist  Anesthesia Plan Comments:         Anesthesia Quick Evaluation

## 2018-08-17 NOTE — Op Note (Signed)
08/17/2018  9:23 AM    [name], Gloria Pines  [id number]   Pre-Op Dx: TONGUE MASS  Post-op Dx: SAME  Proc: Excision dorsal tongue lesion approximately 1.5 cm  Surg:  Roena Malady  Anes:  GOT  EBL: Less than 5 cc  Comp: None  Findings: 1.5 cm exophytic lesion dorsum of tongue  Procedure: [name] was identified in the holding area taken the operating placed in supine position.  After general nasotracheal intubation the table was turned 90 degrees.  The patient was then draped in usual fashion for nasal surgery.  A Molt retractor placed in the oral cavity and the mouth was open.  A 2-0 silk suture was placed through the tongue is retraction stitch.  The tongue was then advanced outward there was an obvious lesion in the midportion of the tongue on the dorsum in the midline.  This measures approximate 1.5 cm.  A local anesthetic of half percent bupivacaine with 1 to 200,000 units of epinephrine was used to inject around the lesion.  A total of 3 cc was used.  A 15 blade was then used to make an elliptical incision around the lesion removing it in its entirety from the surface of the tongue.  The base was inked and sent for frozen section.  A sponge was placed in the oral cavity while we waited on the specimen section.  Approximately 15 minutes the frozen section was returned and came back as a pyogenic granuloma.  The lesion completely excised was no need for any further excision therefore the wound was closed using interrupted 4-0 Vicryl.  The tongue stitch was removed and the Molt retractor was removed the patient was returned to anesthesia where she was extubated in the operating room trach carbon stable condition.  Specimens: Tongue lesion    Dispo:   Good  Plan: Discharged home follow-up 10 days  Roena Malady  08/17/2018 9:23 AM

## 2018-08-17 NOTE — Anesthesia Post-op Follow-up Note (Signed)
Anesthesia QCDR form completed.        

## 2018-08-17 NOTE — Anesthesia Procedure Notes (Signed)
Procedure Name: Intubation Performed by: Lance Muss, CRNA Pre-anesthesia Checklist: Patient identified, Patient being monitored, Timeout performed, Emergency Drugs available and Suction available Patient Re-evaluated:Patient Re-evaluated prior to induction Oxygen Delivery Method: Circle system utilized Preoxygenation: Pre-oxygenation with 100% oxygen Induction Type: IV induction Ventilation: Mask ventilation without difficulty Laryngoscope Size: Mac and 3 Grade View: Grade I Nasal Tubes: Right, Magill forceps- large, utilized and Nasal prep performed Tube size: 6.5 mm Number of attempts: 1 Placement Confirmation: ETT inserted through vocal cords under direct vision,  positive ETCO2 and breath sounds checked- equal and bilateral Tube secured with: Tape Dental Injury: Teeth and Oropharynx as per pre-operative assessment

## 2018-08-17 NOTE — H&P (Signed)
The patient's history has been reviewed, patient examined, no change in status, stable for surgery.  Questions were answered to the patients satisfaction.  

## 2018-08-17 NOTE — Discharge Instructions (Signed)

## 2018-08-18 ENCOUNTER — Encounter: Payer: Self-pay | Admitting: Unknown Physician Specialty

## 2018-08-18 LAB — SURGICAL PATHOLOGY

## 2018-08-20 ENCOUNTER — Inpatient Hospital Stay: Payer: Medicare Other

## 2018-08-20 ENCOUNTER — Inpatient Hospital Stay (HOSPITAL_BASED_OUTPATIENT_CLINIC_OR_DEPARTMENT_OTHER): Payer: Medicare Other | Admitting: Internal Medicine

## 2018-08-20 ENCOUNTER — Other Ambulatory Visit: Payer: Self-pay

## 2018-08-20 ENCOUNTER — Encounter: Payer: Self-pay | Admitting: Internal Medicine

## 2018-08-20 VITALS — BP 157/90 | HR 74 | Temp 98.1°F | Resp 20 | Ht 62.0 in | Wt 209.6 lb

## 2018-08-20 DIAGNOSIS — Z803 Family history of malignant neoplasm of breast: Secondary | ICD-10-CM

## 2018-08-20 DIAGNOSIS — K869 Disease of pancreas, unspecified: Secondary | ICD-10-CM

## 2018-08-20 DIAGNOSIS — E1122 Type 2 diabetes mellitus with diabetic chronic kidney disease: Secondary | ICD-10-CM

## 2018-08-20 DIAGNOSIS — N184 Chronic kidney disease, stage 4 (severe): Secondary | ICD-10-CM | POA: Diagnosis not present

## 2018-08-20 DIAGNOSIS — Z8042 Family history of malignant neoplasm of prostate: Secondary | ICD-10-CM

## 2018-08-20 DIAGNOSIS — R600 Localized edema: Secondary | ICD-10-CM

## 2018-08-20 DIAGNOSIS — D649 Anemia, unspecified: Secondary | ICD-10-CM

## 2018-08-20 DIAGNOSIS — Z170 Estrogen receptor positive status [ER+]: Secondary | ICD-10-CM | POA: Diagnosis not present

## 2018-08-20 DIAGNOSIS — C50212 Malignant neoplasm of upper-inner quadrant of left female breast: Secondary | ICD-10-CM | POA: Diagnosis not present

## 2018-08-20 DIAGNOSIS — R5383 Other fatigue: Secondary | ICD-10-CM | POA: Diagnosis not present

## 2018-08-20 DIAGNOSIS — I509 Heart failure, unspecified: Secondary | ICD-10-CM

## 2018-08-20 DIAGNOSIS — I130 Hypertensive heart and chronic kidney disease with heart failure and stage 1 through stage 4 chronic kidney disease, or unspecified chronic kidney disease: Secondary | ICD-10-CM

## 2018-08-20 DIAGNOSIS — Z7982 Long term (current) use of aspirin: Secondary | ICD-10-CM

## 2018-08-20 DIAGNOSIS — R5381 Other malaise: Secondary | ICD-10-CM

## 2018-08-20 DIAGNOSIS — Z800 Family history of malignant neoplasm of digestive organs: Secondary | ICD-10-CM

## 2018-08-20 DIAGNOSIS — Z5111 Encounter for antineoplastic chemotherapy: Secondary | ICD-10-CM | POA: Diagnosis not present

## 2018-08-20 DIAGNOSIS — Z79810 Long term (current) use of selective estrogen receptor modulators (SERMs): Secondary | ICD-10-CM | POA: Diagnosis not present

## 2018-08-20 DIAGNOSIS — Z794 Long term (current) use of insulin: Secondary | ICD-10-CM | POA: Diagnosis not present

## 2018-08-20 DIAGNOSIS — Z79899 Other long term (current) drug therapy: Secondary | ICD-10-CM

## 2018-08-20 DIAGNOSIS — Z87891 Personal history of nicotine dependence: Secondary | ICD-10-CM

## 2018-08-20 LAB — CBC WITH DIFFERENTIAL/PLATELET
Abs Immature Granulocytes: 0.07 10*3/uL (ref 0.00–0.07)
Basophils Absolute: 0 10*3/uL (ref 0.0–0.1)
Basophils Relative: 0 %
EOS ABS: 0.1 10*3/uL (ref 0.0–0.5)
EOS PCT: 1 %
HCT: 30.4 % — ABNORMAL LOW (ref 36.0–46.0)
Hemoglobin: 10.2 g/dL — ABNORMAL LOW (ref 12.0–15.0)
Immature Granulocytes: 1 %
Lymphocytes Relative: 19 %
Lymphs Abs: 1.3 10*3/uL (ref 0.7–4.0)
MCH: 30.4 pg (ref 26.0–34.0)
MCHC: 33.6 g/dL (ref 30.0–36.0)
MCV: 90.5 fL (ref 80.0–100.0)
Monocytes Absolute: 0.7 10*3/uL (ref 0.1–1.0)
Monocytes Relative: 10 %
Neutro Abs: 4.6 10*3/uL (ref 1.7–7.7)
Neutrophils Relative %: 69 %
Platelets: 309 10*3/uL (ref 150–400)
RBC: 3.36 MIL/uL — ABNORMAL LOW (ref 3.87–5.11)
RDW: 13.2 % (ref 11.5–15.5)
WBC: 6.7 10*3/uL (ref 4.0–10.5)
nRBC: 0 % (ref 0.0–0.2)

## 2018-08-20 LAB — BASIC METABOLIC PANEL
Anion gap: 8 (ref 5–15)
BUN: 34 mg/dL — ABNORMAL HIGH (ref 8–23)
CO2: 25 mmol/L (ref 22–32)
Calcium: 8.4 mg/dL — ABNORMAL LOW (ref 8.9–10.3)
Chloride: 106 mmol/L (ref 98–111)
Creatinine, Ser: 2.8 mg/dL — ABNORMAL HIGH (ref 0.44–1.00)
GFR calc Af Amer: 20 mL/min — ABNORMAL LOW (ref >60)
GFR calc non Af Amer: 17 mL/min — ABNORMAL LOW (ref >60)
Glucose, Bld: 68 mg/dL — ABNORMAL LOW (ref 70–99)
Potassium: 4.4 mmol/L (ref 3.5–5.1)
Sodium: 139 mmol/L (ref 135–145)

## 2018-08-20 MED ORDER — DIPHENHYDRAMINE HCL 50 MG/ML IJ SOLN
50.0000 mg | Freq: Once | INTRAMUSCULAR | Status: AC
  Administered 2018-08-20: 50 mg via INTRAVENOUS
  Filled 2018-08-20: qty 1

## 2018-08-20 MED ORDER — HEPARIN SOD (PORK) LOCK FLUSH 100 UNIT/ML IV SOLN
INTRAVENOUS | Status: AC
  Filled 2018-08-20: qty 5

## 2018-08-20 MED ORDER — SODIUM CHLORIDE 0.9 % IV SOLN
60.0000 mg/m2 | Freq: Once | INTRAVENOUS | Status: AC
  Administered 2018-08-20: 114 mg via INTRAVENOUS
  Filled 2018-08-20: qty 19

## 2018-08-20 MED ORDER — DEXAMETHASONE SODIUM PHOSPHATE 10 MG/ML IJ SOLN
8.0000 mg | Freq: Once | INTRAMUSCULAR | Status: AC
  Administered 2018-08-20: 8 mg via INTRAVENOUS
  Filled 2018-08-20: qty 1

## 2018-08-20 MED ORDER — SODIUM CHLORIDE 0.9% FLUSH
10.0000 mL | Freq: Once | INTRAVENOUS | Status: AC
  Administered 2018-08-20: 10 mL via INTRAVENOUS
  Filled 2018-08-20: qty 10

## 2018-08-20 MED ORDER — HEPARIN SOD (PORK) LOCK FLUSH 100 UNIT/ML IV SOLN
500.0000 [IU] | Freq: Once | INTRAVENOUS | Status: AC
  Administered 2018-08-20: 500 [IU] via INTRAVENOUS
  Filled 2018-08-20: qty 5

## 2018-08-20 MED ORDER — SODIUM CHLORIDE 0.9 % IV SOLN
Freq: Once | INTRAVENOUS | Status: AC
  Administered 2018-08-20: 10:00:00 via INTRAVENOUS
  Filled 2018-08-20: qty 250

## 2018-08-20 MED ORDER — FAMOTIDINE IN NACL 20-0.9 MG/50ML-% IV SOLN
20.0000 mg | Freq: Once | INTRAVENOUS | Status: AC
  Administered 2018-08-20: 20 mg via INTRAVENOUS
  Filled 2018-08-20: qty 50

## 2018-08-20 MED ORDER — HEPARIN SOD (PORK) LOCK FLUSH 100 UNIT/ML IV SOLN: 500.0000 [IU] | Freq: Once | INTRAVENOUS | Status: AC

## 2018-08-20 NOTE — Progress Notes (Signed)
Patient here for treatment. She has had a tumour removed from her tongue this week.

## 2018-08-20 NOTE — Progress Notes (Signed)
[facility] OFFICE PROGRESS NOTE  Patient Care Team: [name], [name], [name]; [name]; [name]; [name]; EBUS- Positive Paratrac/subcarinal LN s/p ? Taxotere [in [place]; [name]; [name];[name]; [name]; suv 3]; [name]; [name]; [name];  [name]; Ant R iliac bone; [name]; [name]; [name]; [name]; [name]; mild left sided weakness]  # Jan 2020-  Lobular lesion on tongue- s/p excision pyogenic granuloma [Dr.McQueen]   # GENETIC TESTING/COUNSELLING: HETEROZYGOUS Cystic Fibrosis Gene [explains hx of recurrent pancreatitis]  # MOLECULAR TESTING: NA  ------------------------------------------------   DIAGNOSIS: [ 2015] BREAST CA; [name]; [name] ;GOALS: Palliative  CURRENT/MOST RECENT THERAPY [ April 2019] TAXOL      Carcinoma of upper-inner quadrant of left breast in female, estrogen receptor positive ([place])      INTERVAL HISTORY:  [name] 63 y.o.  female pleasant patient above history of ER PR positive HER-2 negative breast cancer on Taxol is here for follow-up.  Patient continues to  complain of mild to moderate tingling and numbness in her extremities.  This is not interrupting her daily routine.  No falls.  Her appetite is good.  No weight loss.  Complains of mild swelling in the legs not any worse.  No new shortness of breath or cough.   Review of Systems  Constitutional: Positive for malaise/fatigue. Negative for chills, diaphoresis, [name], hemoptysis, sputum production, shortness of breath and wheezing.   Cardiovascular: Positive for leg swelling. Negative for chest pain, palpitations and orthopnea.  Gastrointestinal: Negative for abdominal pain, blood in stool, constipation, diarrhea, heartburn, melena, nausea and vomiting.  Genitourinary: Negative for dysuria, frequency and urgency.  Musculoskeletal: Negative for back pain and joint pain.  Skin: Negative.  Negative for itching and rash.  Neurological: Positive for tingling. Negative for dizziness, focal weakness, weakness and headaches.  Endo/Heme/Allergies: Does not bruise/bleed easily.  Psychiatric/Behavioral: Negative for depression. The patient is not nervous/anxious and does not have insomnia.       PAST MEDICAL HISTORY :  Past Medical History:  Diagnosis Date  . Anemia   . Asthma   . Cancer ([place]) 03/10/2018   Per [facility] PET order. Carcinoma of upper-inner quadrant of left breast in female, estrogen receptor positive .  Marland Kitchen CHF (congestive heart failure) ([place]) 1997  . CKD (chronic kidney disease)   . Depression   . Diabetes mellitus, type 2 ([place])   . Family history of breast cancer   . Family history of colon cancer   . Family history of ovarian cancer   .  Family history of pancreatic cancer   . Family history of prostate cancer   . Family history of stomach cancer   . Hair loss   . History of left breast cancer 05/29/14  . History of partial hysterectomy 12/31/2016    Per patient.  Has not had a period in years.  Had a partial hysterectomy years ago.  Marland Kitchen Hypertension   . Obesity   . Pancreatitis 1997  . Stroke [facility] 2010   with mild left arm weakness    PAST SURGICAL HISTORY :   Past Surgical History:  Procedure Laterality Date  . CESAREAN SECTION    . CHOLECYSTECTOMY    . EXCISION OF TONGUE LESION N/A 08/17/2018   Procedure: EXCISION OF TONGUE LESION WITH FROZEN SECTIONS;  Surgeon: Beverly Gust, MD;  Location: [facility] ORS;  Service: ENT;  Laterality: N/A;  . PARTIAL HYSTERECTOMY  12/31/2016   Per patient, she has not had a period in years since she had a partial hysterectomy.    FAMILY HISTORY :   Family History  Problem Relation Age of Onset  . Ovarian cancer Mother 73  . Diabetes Mother   . Hypertension Mother   . COPD Father   . Hypertension Father   . Colon cancer Father 80  . Diabetes Sister   . Breast cancer Sister 67       bilateral  . Diabetes Brother   . Leukemia Maternal Aunt   . Pancreatic cancer Paternal Aunt 2  . Pancreatic cancer Paternal Uncle   . Colon cancer Paternal Uncle   . Stomach cancer Maternal Grandfather 72  . Throat cancer Paternal Grandmother   . Breast cancer Maternal Aunt 80  . Colon cancer Maternal Aunt   . Bone cancer Maternal Aunt   . Breast cancer Paternal Aunt        dx >50  . Prostate cancer Paternal Uncle   . Pancreatic cancer Paternal Uncle   . Throat cancer Paternal Uncle   . Lung cancer Paternal Uncle   . Stomach cancer Paternal Uncle   . Brain cancer Paternal Aunt   . Cancer Cousin        liver, kidney  . Prostate cancer Cousin        meastatic  . Lung cancer Other     SOCIAL HISTORY:   Social History   Tobacco Use  . Smoking status: Former Smoker    Packs/day: 0.50    Years: 1.00    Pack years: 0.50    Types: Cigarettes  . Smokeless tobacco: Never Used  Substance Use Topics  . Alcohol use: No    Alcohol/week: 0.0 standard drinks  . Drug use: No    ALLERGIES:  has No  Known Allergies.  MEDICATIONS:  Current Outpatient Medications  Medication Sig Dispense Refill  . acetaminophen-codeine (TYLENOL #4) 300-60 MG tablet Take 2 tablets by mouth every 6 (six) hours as needed for moderate pain.     Marland Kitchen albuterol (PROAIR HFA) 108 (90 BASE) MCG/ACT inhaler Inhale 2 puffs into the lungs every 6 (six) hours as needed for wheezing or shortness of breath.     Marland Kitchen albuterol (PROVENTIL) (2.5 MG/3ML) 0.083% nebulizer solution Inhale 2.5 mg into the lungs every 6 (six) hours as needed for shortness of breath.     . ALPRAZolam (XANAX) 0.5 MG tablet Take 0.5 mg by mouth at bedtime as needed for anxiety or sleep.     Marland Kitchen amLODipine (NORVASC) 10 MG tablet Take 10 mg by mouth daily.     Marland Kitchen  aspirin EC 81 MG tablet Take 81 mg by mouth daily.     Marland Kitchen atenolol (TENORMIN) 100 MG tablet Take 100 mg by mouth 2 (two) times daily.     . B-D ULTRA-FINE 33 LANCETS MISC Use 1 each 2 (two) times daily.    . B-D ULTRAFINE III SHORT PEN 31G X 8 MM MISC     . bumetanide (BUMEX) 0.5 MG tablet Take 0.5 mg by mouth 2 (two) times daily.     . calcitRIOL (ROCALTROL) 0.25 MCG capsule Take 0.25 mcg by mouth every Monday, Wednesday, and Friday.     . Cinnamon 500 MG capsule Take 1,000 mg by mouth daily.     . cloNIDine (CATAPRES) 0.2 MG tablet Take 0.2 mg by mouth 2 (two) times daily.     . enalapril (VASOTEC) 20 MG tablet Take 20 mg by mouth 2 (two) times daily.     . ferrous sulfate 325 (65 FE) MG tablet Take 325 mg by mouth 2 (two) times daily with a meal.    . FLUoxetine (PROZAC) 20 MG capsule Take 20 mg by mouth 2 (two) times daily.     Marland Kitchen gabapentin (NEURONTIN) 100 MG capsule Take 1 capsule (100 mg total) by mouth at bedtime. 30 capsule 2  . glucose blood (ONE TOUCH ULTRA TEST) test strip Use 1 each 2 (two) times daily. Use as instructed.    . glyBURIDE (DIABETA) 5 MG tablet Take 10 mg by mouth 2 (two) times daily with a meal.     . LEVEMIR FLEXTOUCH 100 UNIT/ML Pen Inject 55 Units into the skin daily.      Marland Kitchen loperamide (IMODIUM A-D) 2 MG capsule Take 2 mg by mouth as needed for diarrhea or loose stools.    . metoprolol tartrate (LOPRESSOR) 25 MG tablet TAKE 1 TABLET BY MOUTH TWICE DAILY 60 tablet 0  . mometasone (NASONEX) 50 MCG/ACT nasal spray Place 2 sprays into the nose daily as needed (Allergies).     . NOVOLOG FLEXPEN 100 UNIT/ML FlexPen Inject 7 Units into the skin 2 (two) times daily.     . ondansetron (ZOFRAN) 8 MG tablet One pill every 8 hours as needed for nausea/vomitting. (Patient taking differently: Take 8 mg by mouth every 8 (eight) hours as needed for nausea or vomiting. One pill every 8 hours as needed for nausea/vomitting.) 40 tablet 1  . prochlorperazine (COMPAZINE) 10 MG tablet Take 1 tablet (10 mg total) by mouth every 6 (six) hours as needed for nausea or vomiting. Please note change in strength 60 tablet 4  . salmeterol (SEREVENT) 50 MCG/DOSE diskus inhaler Inhale 1 puff into the lungs daily as needed (shortness of breath).     . simvastatin (ZOCOR) 20 MG tablet Take 20 mg by mouth daily at 6 PM.     . vitamin B-12 (CYANOCOBALAMIN) 1000 MCG tablet Take 1,000 mcg by mouth daily.     No current facility-administered medications for this visit.    Facility-Administered Medications Ordered in Other Visits  Medication Dose Route Frequency Provider Last Rate Last Dose  . sodium chloride flush (NS) 0.9 % injection 10 mL  10 mL Intravenous PRN Cammie Sickle, MD   10 mL at 01/30/16 1054    PHYSICAL EXAMINATION: ECOG PERFORMANCE STATUS: 1 - Symptomatic but completely ambulatory  BP (!) 157/90 (BP Location: Right Arm, Patient Position: Sitting)   Pulse 74   Temp 98.1 F (36.7 C) (Tympanic)   Resp 20   Ht '5\' 2"'  (1.575  m)   Wt 209 lb 9.6 oz (95.1 kg)   BMI 38.34 kg/m   Filed Weights   08/20/18 0836  Weight: 209 lb 9.6 oz (95.1 kg)    Physical Exam  Constitutional: She is oriented to person, place, and time and well-developed, well-nourished, and in no distress.   She is alone.  HENT:  Head: Normocephalic and atraumatic.  Mouth/Throat: Oropharynx is clear and moist. No oropharyngeal exudate.  A polypoid approximate 2 cm lesion noted dorsal surface of the tongue.  No active bleeding.  Eyes: Pupils are equal, round, and reactive to light.  Neck: Normal range of motion. Neck supple.  Cardiovascular: Normal rate and regular rhythm.  Pulmonary/Chest: No respiratory distress. She has no wheezes.  Abdominal: Soft. Bowel sounds are normal. She exhibits no distension and no mass. There is no abdominal tenderness. There is no rebound and no guarding.  Musculoskeletal: Normal range of motion.        General: No tenderness.  Neurological: She is alert and oriented to person, place, and time.  Skin: Skin is warm.  Psychiatric: Affect normal.       LABORATORY DATA:  I have reviewed the data as listed    Component Value Date/Time   NA 139 08/20/2018 0824   NA 130 (L) 06/06/2014 1102   K 4.4 08/20/2018 0824   K 3.9 06/06/2014 1102   CL 106 08/20/2018 0824   CL 95 (L) 06/06/2014 1102   CO2 25 08/20/2018 0824   CO2 28 06/06/2014 1102   GLUCOSE 68 (L) 08/20/2018 0824   GLUCOSE 349 (H) 06/06/2014 1102   BUN 34 (H) 08/20/2018 0824   BUN 17 06/06/2014 1102   CREATININE 2.80 (H) 08/20/2018 0824   CREATININE 1.63 (H) 06/06/2014 1102   CALCIUM 8.4 (L) 08/20/2018 0824   CALCIUM 9.2 06/06/2014 1102   PROT 7.1 08/13/2018 0824   PROT 8.2 06/06/2014 1102   ALBUMIN 3.7 08/13/2018 0824   ALBUMIN 3.3 (L) 06/06/2014 1102   AST 17 08/13/2018 0824   AST 7 (L) 06/06/2014 1102   ALT 16 08/13/2018 0824   ALT 12 (L) 06/06/2014 1102   ALKPHOS 72 08/13/2018 0824   ALKPHOS 74 06/06/2014 1102   BILITOT 0.5 08/13/2018 0824   BILITOT 0.4 06/06/2014 1102   GFRNONAA 17 (L) 08/20/2018 0824   GFRNONAA 35 (L) 06/06/2014 1102   GFRAA 20 (L) 08/20/2018 0824   GFRAA 42 (L) 06/06/2014 1102    No results found for: SPEP, UPEP  Lab Results  Component Value Date   WBC  6.7 08/20/2018   NEUTROABS 4.6 08/20/2018   HGB 10.2 (L) 08/20/2018   HCT 30.4 (L) 08/20/2018   MCV 90.5 08/20/2018   PLT 309 08/20/2018      Chemistry      Component Value Date/Time   NA 139 08/20/2018 0824   NA 130 (L) 06/06/2014 1102   K 4.4 08/20/2018 0824   K 3.9 06/06/2014 1102   CL 106 08/20/2018 0824   CL 95 (L) 06/06/2014 1102   CO2 25 08/20/2018 0824   CO2 28 06/06/2014 1102   BUN 34 (H) 08/20/2018 0824   BUN 17 06/06/2014 1102   CREATININE 2.80 (H) 08/20/2018 0824   CREATININE 1.63 (H) 06/06/2014 1102      Component Value Date/Time   CALCIUM 8.4 (L) 08/20/2018 0824   CALCIUM 9.2 06/06/2014 1102   ALKPHOS 72 08/13/2018 0824   ALKPHOS 74 06/06/2014 1102   AST 17 08/13/2018 0824   AST  7 (L) 06/06/2014 1102   ALT 16 08/13/2018 0824   ALT 12 (L) 06/06/2014 1102   BILITOT 0.5 08/13/2018 0824   BILITOT 0.4 06/06/2014 1102       RADIOGRAPHIC STUDIES: I have personally reviewed the radiological images as listed and agreed with the findings in the report. No results found.   ASSESSMENT & PLAN:  Carcinoma of upper-inner quadrant of left breast in female, estrogen receptor positive ([place]) Left breast cancer- stage IV- ER/PR +, HER-2/neu - on Taxol chemotherapy; NOV 4th 2019- PET- overall STABLE; RLL nodule- clinically rounded atelectasis/improved; but incidental pancreatic uptake [hx of pancreatitis]. Stable.   # Continue Taxol 41m/m2; Labs today reviewed;  Acceptable for treatment today. Will order PET scan at next visit. Discussed with patient; ca-27-29-overall stable.   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   # Bil LE swelling/ededma MILD- discussed re: compression stocking/ leg- stable.   # Chronic kidney disease - stage IV- creatinine2.8- stable.   # Type 2 DM- insulin dependent/stable.    # Anemia- hemoglobin today-9-10; stable.  on PO iron.   # Lobular lesion on tongue- s/p excision pyogenic granuloma [Dr.McQueen]   #  DISPOSITION:  #  Treatment today; # follow up in 2 weeks/labs/MD-cbc/cmp-ca-27-29--/Taxol-Dr.B   No orders of the defined types were placed in this encounter.  All questions were answered. The patient knows to call the clinic with any problems, questions or concerns.      GCammie Sickle MD 08/20/2018 12:59 PM

## 2018-08-20 NOTE — Assessment & Plan Note (Addendum)
Left breast cancer- stage IV- ER/PR +, HER-2/neu - on Taxol chemotherapy; NOV 4th 2019- PET- overall STABLE; RLL nodule- clinically rounded atelectasis/improved; but incidental pancreatic uptake [hx of pancreatitis]. Stable.   # Continue Taxol 64m/m2; Labs today reviewed;  Acceptable for treatment today. Will order PET scan at next visit. Discussed with patient; ca-27-29-overall stable.   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   # Bil LE swelling/ededma MILD- discussed re: compression stocking/ leg- stable.   # Chronic kidney disease - stage IV- creatinine2.8- stable.   # Type 2 DM- insulin dependent/stable.    # Anemia- hemoglobin today-9-10; stable.  on PO iron.   # Lobular lesion on tongue- s/p excision pyogenic granuloma [Dr.McQueen]   #  DISPOSITION:  # Treatment today; # follow up in 2 weeks/labs/MD-cbc/cmp-ca-27-29--/Taxol-Dr.B

## 2018-08-21 LAB — CANCER ANTIGEN 27.29: CA 27.29: 52.6 U/mL — ABNORMAL HIGH (ref 0.0–38.6)

## 2018-09-01 ENCOUNTER — Other Ambulatory Visit: Payer: Self-pay | Admitting: *Deleted

## 2018-09-01 DIAGNOSIS — Z170 Estrogen receptor positive status [ER+]: Principal | ICD-10-CM

## 2018-09-01 DIAGNOSIS — C50212 Malignant neoplasm of upper-inner quadrant of left female breast: Secondary | ICD-10-CM

## 2018-09-03 ENCOUNTER — Inpatient Hospital Stay (HOSPITAL_BASED_OUTPATIENT_CLINIC_OR_DEPARTMENT_OTHER): Payer: Medicare Other | Admitting: Internal Medicine

## 2018-09-03 ENCOUNTER — Inpatient Hospital Stay: Payer: Medicare Other

## 2018-09-03 ENCOUNTER — Encounter: Payer: Self-pay | Admitting: Internal Medicine

## 2018-09-03 VITALS — BP 126/81 | HR 69 | Temp 97.6°F | Resp 16 | Wt 209.2 lb

## 2018-09-03 DIAGNOSIS — C50212 Malignant neoplasm of upper-inner quadrant of left female breast: Secondary | ICD-10-CM | POA: Diagnosis not present

## 2018-09-03 DIAGNOSIS — Z7982 Long term (current) use of aspirin: Secondary | ICD-10-CM | POA: Diagnosis not present

## 2018-09-03 DIAGNOSIS — Z170 Estrogen receptor positive status [ER+]: Principal | ICD-10-CM

## 2018-09-03 DIAGNOSIS — Z87891 Personal history of nicotine dependence: Secondary | ICD-10-CM | POA: Diagnosis not present

## 2018-09-03 DIAGNOSIS — K869 Disease of pancreas, unspecified: Secondary | ICD-10-CM | POA: Diagnosis not present

## 2018-09-03 DIAGNOSIS — D649 Anemia, unspecified: Secondary | ICD-10-CM | POA: Diagnosis not present

## 2018-09-03 DIAGNOSIS — Z5111 Encounter for antineoplastic chemotherapy: Secondary | ICD-10-CM | POA: Diagnosis not present

## 2018-09-03 DIAGNOSIS — Z79810 Long term (current) use of selective estrogen receptor modulators (SERMs): Secondary | ICD-10-CM | POA: Diagnosis not present

## 2018-09-03 LAB — CBC WITH DIFFERENTIAL/PLATELET
Abs Immature Granulocytes: 0.17 10*3/uL — ABNORMAL HIGH (ref 0.00–0.07)
BASOS ABS: 0 10*3/uL (ref 0.0–0.1)
Basophils Relative: 1 %
Eosinophils Absolute: 0.1 10*3/uL (ref 0.0–0.5)
Eosinophils Relative: 1 %
HCT: 29.9 % — ABNORMAL LOW (ref 36.0–46.0)
Hemoglobin: 10 g/dL — ABNORMAL LOW (ref 12.0–15.0)
Immature Granulocytes: 2 %
Lymphocytes Relative: 13 %
Lymphs Abs: 1.1 10*3/uL (ref 0.7–4.0)
MCH: 30.1 pg (ref 26.0–34.0)
MCHC: 33.4 g/dL (ref 30.0–36.0)
MCV: 90.1 fL (ref 80.0–100.0)
Monocytes Absolute: 1 10*3/uL (ref 0.1–1.0)
Monocytes Relative: 11 %
NEUTROS PCT: 72 %
Neutro Abs: 6.3 10*3/uL (ref 1.7–7.7)
Platelets: 333 10*3/uL (ref 150–400)
RBC: 3.32 MIL/uL — ABNORMAL LOW (ref 3.87–5.11)
RDW: 13.3 % (ref 11.5–15.5)
WBC: 8.7 10*3/uL (ref 4.0–10.5)
nRBC: 0 % (ref 0.0–0.2)

## 2018-09-03 LAB — COMPREHENSIVE METABOLIC PANEL
ALT: 15 U/L (ref 0–44)
ANION GAP: 10 (ref 5–15)
AST: 16 U/L (ref 15–41)
Albumin: 3.7 g/dL (ref 3.5–5.0)
Alkaline Phosphatase: 80 U/L (ref 38–126)
BUN: 37 mg/dL — ABNORMAL HIGH (ref 8–23)
CALCIUM: 8.6 mg/dL — AB (ref 8.9–10.3)
CO2: 23 mmol/L (ref 22–32)
Chloride: 104 mmol/L (ref 98–111)
Creatinine, Ser: 3.27 mg/dL — ABNORMAL HIGH (ref 0.44–1.00)
GFR calc Af Amer: 17 mL/min — ABNORMAL LOW (ref >60)
GFR calc non Af Amer: 15 mL/min — ABNORMAL LOW (ref >60)
Glucose, Bld: 107 mg/dL — ABNORMAL HIGH (ref 70–99)
Potassium: 4.3 mmol/L (ref 3.5–5.1)
Sodium: 137 mmol/L (ref 135–145)
Total Bilirubin: 0.6 mg/dL (ref 0.3–1.2)
Total Protein: 7.3 g/dL (ref 6.5–8.1)

## 2018-09-03 MED ORDER — SODIUM CHLORIDE 0.9 % IV SOLN
60.0000 mg/m2 | Freq: Once | INTRAVENOUS | Status: AC
  Administered 2018-09-03: 114 mg via INTRAVENOUS
  Filled 2018-09-03: qty 19

## 2018-09-03 MED ORDER — HEPARIN SOD (PORK) LOCK FLUSH 100 UNIT/ML IV SOLN
500.0000 [IU] | Freq: Once | INTRAVENOUS | Status: AC
  Administered 2018-09-03: 500 [IU] via INTRAVENOUS
  Filled 2018-09-03: qty 5

## 2018-09-03 MED ORDER — SODIUM CHLORIDE 0.9% FLUSH
10.0000 mL | Freq: Once | INTRAVENOUS | Status: AC
  Administered 2018-09-03: 10 mL via INTRAVENOUS
  Filled 2018-09-03: qty 10

## 2018-09-03 MED ORDER — DIPHENHYDRAMINE HCL 50 MG/ML IJ SOLN
50.0000 mg | Freq: Once | INTRAMUSCULAR | Status: AC
  Administered 2018-09-03: 50 mg via INTRAVENOUS
  Filled 2018-09-03: qty 1

## 2018-09-03 MED ORDER — SODIUM CHLORIDE 0.9 % IV SOLN
Freq: Once | INTRAVENOUS | Status: AC
  Administered 2018-09-03: 09:00:00 via INTRAVENOUS
  Filled 2018-09-03: qty 250

## 2018-09-03 MED ORDER — FAMOTIDINE IN NACL 20-0.9 MG/50ML-% IV SOLN
20.0000 mg | Freq: Once | INTRAVENOUS | Status: AC
  Administered 2018-09-03: 20 mg via INTRAVENOUS
  Filled 2018-09-03: qty 50

## 2018-09-03 MED ORDER — DEXAMETHASONE SODIUM PHOSPHATE 10 MG/ML IJ SOLN
8.0000 mg | Freq: Once | INTRAMUSCULAR | Status: AC
  Administered 2018-09-03: 8 mg via INTRAVENOUS
  Filled 2018-09-03: qty 1

## 2018-09-03 NOTE — Progress Notes (Signed)
[name] OFFICE PROGRESS NOTE  Patient Care Team: [name], [name], [name]; [name]; [name]; [name]; EBUS- Positive Paratrac/subcarinal LN s/p ? Taxotere [in [place]; [name]; [name];[name]; [name]; suv 3]; [name]; [name]; [name];  [name]; Ant R iliac bone; [name]; [name]; [name]; [name]; [name]; mild left sided weakness]  # Jan 2020-  Lobular lesion on tongue- s/p excision pyogenic granuloma [Dr.McQueen]   # GENETIC TESTING/COUNSELLING: HETEROZYGOUS Cystic Fibrosis Gene [explains hx of recurrent pancreatitis]  # MOLECULAR TESTING: NA  ------------------------------------------------   DIAGNOSIS: [ 2015] BREAST CA; [name]; [name] ;GOALS: Palliative  CURRENT/MOST RECENT THERAPY [ April 2019] TAXOL      Carcinoma of upper-inner quadrant of left breast in female, estrogen receptor positive ([place])      INTERVAL HISTORY:  [name] 63 y.o.  female pleasant patient above history of ER PR positive HER-2 negative breast cancer on Taxol is here for follow-up.  Patient complains of  mild swelling in the legs.  Not any worse.No significant weight gain.  Mild tingling and numbness in the feet not any worse.  No falls.  Review of Systems  Constitutional: Positive for malaise/fatigue. Negative for chills, diaphoresis, [name], hemoptysis, sputum production, shortness of breath and wheezing.   Cardiovascular: Positive for leg swelling. Negative for chest pain, palpitations and orthopnea.  Gastrointestinal: Negative for abdominal pain, blood in stool, constipation, diarrhea, heartburn, melena, nausea and vomiting.  Genitourinary: Negative for dysuria, frequency and urgency.  Musculoskeletal: Negative for back pain and joint pain.  Skin: Negative.  Negative for itching and rash.  Neurological: Positive for tingling. Negative for dizziness, focal weakness, weakness and headaches.  Endo/Heme/Allergies: Does not bruise/bleed easily.  Psychiatric/Behavioral: Negative for depression. The patient is not nervous/anxious and does not have insomnia.       PAST MEDICAL HISTORY :  Past Medical History:  Diagnosis Date  . Anemia   . Asthma   . Cancer ([place]) 03/10/2018   Per [facility] PET order. Carcinoma of upper-inner quadrant of left breast in female, estrogen receptor positive .  Marland Kitchen CHF (congestive heart failure) ([place]) 1997  . CKD (chronic kidney disease)   . Depression   . Diabetes mellitus, type 2 ([place])   . Family history of breast cancer   . Family history of colon cancer   . Family history of ovarian cancer   . Family history of pancreatic cancer   . Family history of prostate cancer   . Family history of stomach cancer   .  Hair loss   . History of left breast cancer 05/29/14  . History of partial hysterectomy 12/31/2016   Per patient.  Has not had a period in years.  Had a partial hysterectomy years ago.  Marland Kitchen Hypertension   . Obesity   .  Pancreatitis 1997  . Stroke [facility]) 2010   with mild left arm weakness    PAST SURGICAL HISTORY :   Past Surgical History:  Procedure Laterality Date  . CESAREAN SECTION    . CHOLECYSTECTOMY    . EXCISION OF TONGUE LESION N/A 08/17/2018   Procedure: EXCISION OF TONGUE LESION WITH FROZEN SECTIONS;  Surgeon: Beverly Gust, MD;  Location: [facility] ORS;  Service: ENT;  Laterality: N/A;  . PARTIAL HYSTERECTOMY  12/31/2016   Per patient, she has not had a period in years since she had a partial hysterectomy.    FAMILY HISTORY :   Family History  Problem Relation Age of Onset  . Ovarian cancer Mother 54  . Diabetes Mother   . Hypertension Mother   . COPD Father   . Hypertension Father   . Colon cancer Father 22  . Diabetes Sister   . Breast cancer Sister 26       bilateral  . Diabetes Brother   . Leukemia Maternal Aunt   . Pancreatic cancer Paternal Aunt 74  . Pancreatic cancer Paternal Uncle   . Colon cancer Paternal Uncle   . Stomach cancer Maternal Grandfather 52  . Throat cancer Paternal Grandmother   . Breast cancer Maternal Aunt 80  . Colon cancer Maternal Aunt   . Bone cancer Maternal Aunt   . Breast cancer Paternal Aunt        dx >50  . Prostate cancer Paternal Uncle   . Pancreatic cancer Paternal Uncle   . Throat cancer Paternal Uncle   . Lung cancer Paternal Uncle   . Stomach cancer Paternal Uncle   . Brain cancer Paternal Aunt   . Cancer Cousin        liver, kidney  . Prostate cancer Cousin        meastatic  . Lung cancer Other     SOCIAL HISTORY:   Social History   Tobacco Use  . Smoking status: Former Smoker    Packs/day: 0.50    Years: 1.00    Pack years: 0.50    Types: Cigarettes  . Smokeless tobacco: Never Used  Substance Use Topics  . Alcohol use: No    Alcohol/week: 0.0 standard drinks  . Drug use: No    ALLERGIES:  has No Known Allergies.  MEDICATIONS:  Current Outpatient Medications  Medication Sig Dispense Refill  .  acetaminophen-codeine (TYLENOL #4) 300-60 MG tablet Take 2 tablets by mouth every 6 (six) hours as needed for moderate pain.     Marland Kitchen albuterol (PROAIR HFA) 108 (90 BASE) MCG/ACT inhaler Inhale 2 puffs into the lungs every 6 (six) hours as needed for wheezing or shortness of breath.     Marland Kitchen albuterol (PROVENTIL) (2.5 MG/3ML) 0.083% nebulizer solution Inhale 2.5 mg into the lungs every 6 (six) hours as needed for shortness of breath.     . ALPRAZolam (XANAX) 0.5 MG tablet Take 0.5 mg by mouth at bedtime as needed for anxiety or sleep.     Marland Kitchen amLODipine (NORVASC) 10 MG tablet Take 10 mg by mouth daily.     Marland Kitchen aspirin EC 81 MG tablet Take 81 mg by mouth daily.     Marland Kitchen atenolol (TENORMIN) 100  MG tablet Take 100 mg by mouth 2 (two) times daily.     . B-D ULTRA-FINE 33 LANCETS MISC Use 1 each 2 (two) times daily.    . B-D ULTRAFINE III SHORT PEN 31G X 8 MM MISC     . bumetanide (BUMEX) 0.5 MG tablet Take 0.5 mg by mouth 2 (two) times daily.     . calcitRIOL (ROCALTROL) 0.25 MCG capsule Take 0.25 mcg by mouth every Monday, Wednesday, and Friday.     . Cinnamon 500 MG capsule Take 1,000 mg by mouth daily.     . cloNIDine (CATAPRES) 0.2 MG tablet Take 0.2 mg by mouth 2 (two) times daily.     . enalapril (VASOTEC) 20 MG tablet Take 20 mg by mouth 2 (two) times daily.     . ferrous sulfate 325 (65 FE) MG tablet Take 325 mg by mouth 2 (two) times daily with a meal.    . FLUoxetine (PROZAC) 20 MG capsule Take 20 mg by mouth 2 (two) times daily.     Marland Kitchen gabapentin (NEURONTIN) 100 MG capsule Take 1 capsule (100 mg total) by mouth at bedtime. 30 capsule 2  . glucose blood (ONE TOUCH ULTRA TEST) test strip Use 1 each 2 (two) times daily. Use as instructed.    . glyBURIDE (DIABETA) 5 MG tablet Take 10 mg by mouth 2 (two) times daily with a meal.     . LEVEMIR FLEXTOUCH 100 UNIT/ML Pen Inject 55 Units into the skin daily.     Marland Kitchen loperamide (IMODIUM A-D) 2 MG capsule Take 2 mg by mouth as needed for diarrhea or loose stools.     . metoprolol tartrate (LOPRESSOR) 25 MG tablet TAKE 1 TABLET BY MOUTH TWICE DAILY 60 tablet 0  . mometasone (NASONEX) 50 MCG/ACT nasal spray Place 2 sprays into the nose daily as needed (Allergies).     . NOVOLOG FLEXPEN 100 UNIT/ML FlexPen Inject 7 Units into the skin 2 (two) times daily.     . ondansetron (ZOFRAN) 8 MG tablet One pill every 8 hours as needed for nausea/vomitting. (Patient taking differently: Take 8 mg by mouth every 8 (eight) hours as needed for nausea or vomiting. One pill every 8 hours as needed for nausea/vomitting.) 40 tablet 1  . prochlorperazine (COMPAZINE) 10 MG tablet Take 1 tablet (10 mg total) by mouth every 6 (six) hours as needed for nausea or vomiting. Please note change in strength 60 tablet 4  . salmeterol (SEREVENT) 50 MCG/DOSE diskus inhaler Inhale 1 puff into the lungs daily as needed (shortness of breath).     . simvastatin (ZOCOR) 20 MG tablet Take 20 mg by mouth daily at 6 PM.     . vitamin B-12 (CYANOCOBALAMIN) 1000 MCG tablet Take 1,000 mcg by mouth daily.     No current facility-administered medications for this visit.    Facility-Administered Medications Ordered in Other Visits  Medication Dose Route Frequency Provider Last Rate Last Dose  . sodium chloride flush (NS) 0.9 % injection 10 mL  10 mL Intravenous PRN Cammie Sickle, MD   10 mL at 01/30/16 1054    PHYSICAL EXAMINATION: ECOG PERFORMANCE STATUS: 1 - Symptomatic but completely ambulatory  BP 126/81 (BP Location: Left Arm, Patient Position: Sitting, Cuff Size: Normal)   Pulse 69   Temp 97.6 F (36.4 C) (Tympanic)   Resp 16   Wt 209 lb 3.2 oz (94.9 kg)   BMI 38.26 kg/m   Filed Weights   09/03/18 0843  Weight: 209 lb 3.2 oz (94.9 kg)    Physical Exam  Constitutional: She is oriented to person, place, and time and well-developed, well-nourished, and in no distress.  She is alone.  HENT:  Head: Normocephalic and atraumatic.  Mouth/Throat: Oropharynx is clear and moist. No  oropharyngeal exudate.  A polypoid approximate 2 cm lesion noted dorsal surface of the tongue.  No active bleeding.  Eyes: Pupils are equal, round, and reactive to light.  Neck: Normal range of motion. Neck supple.  Cardiovascular: Normal rate and regular rhythm.  Pulmonary/Chest: No respiratory distress. She has no wheezes.  Abdominal: Soft. Bowel sounds are normal. She exhibits no distension and no mass. There is no abdominal tenderness. There is no rebound and no guarding.  Musculoskeletal: Normal range of motion.        General: No tenderness.  Neurological: She is alert and oriented to person, place, and time.  Skin: Skin is warm.  Psychiatric: Affect normal.       LABORATORY DATA:  I have reviewed the data as listed    Component Value Date/Time   NA 134 (L) 09/10/2018 0758   NA 130 (L) 06/06/2014 1102   K 3.8 09/10/2018 0758   K 3.9 06/06/2014 1102   CL 105 09/10/2018 0758   CL 95 (L) 06/06/2014 1102   CO2 23 09/10/2018 0758   CO2 28 06/06/2014 1102   GLUCOSE 117 (H) 09/10/2018 0758   GLUCOSE 349 (H) 06/06/2014 1102   BUN 41 (H) 09/10/2018 0758   BUN 17 06/06/2014 1102   CREATININE 2.97 (H) 09/10/2018 0758   CREATININE 1.63 (H) 06/06/2014 1102   CALCIUM 7.9 (L) 09/10/2018 0758   CALCIUM 9.2 06/06/2014 1102   PROT 7.0 09/10/2018 0758   PROT 8.2 06/06/2014 1102   ALBUMIN 3.5 09/10/2018 0758   ALBUMIN 3.3 (L) 06/06/2014 1102   AST 16 09/10/2018 0758   AST 7 (L) 06/06/2014 1102   ALT 17 09/10/2018 0758   ALT 12 (L) 06/06/2014 1102   ALKPHOS 78 09/10/2018 0758   ALKPHOS 74 06/06/2014 1102   BILITOT 0.5 09/10/2018 0758   BILITOT 0.4 06/06/2014 1102   GFRNONAA 16 (L) 09/10/2018 0758   GFRNONAA 35 (L) 06/06/2014 1102   GFRAA 19 (L) 09/10/2018 0758   GFRAA 42 (L) 06/06/2014 1102    No results found for: SPEP, UPEP  Lab Results  Component Value Date   WBC 6.7 09/10/2018   NEUTROABS 5.3 09/10/2018   HGB 9.5 (L) 09/10/2018   HCT 28.2 (L) 09/10/2018   MCV 89.2  09/10/2018   PLT 294 09/10/2018      Chemistry      Component Value Date/Time   NA 134 (L) 09/10/2018 0758   NA 130 (L) 06/06/2014 1102   K 3.8 09/10/2018 0758   K 3.9 06/06/2014 1102   CL 105 09/10/2018 0758   CL 95 (L) 06/06/2014 1102   CO2 23 09/10/2018 0758   CO2 28 06/06/2014 1102   BUN 41 (H) 09/10/2018 0758   BUN 17 06/06/2014 1102   CREATININE 2.97 (H) 09/10/2018 0758   CREATININE 1.63 (H) 06/06/2014 1102      Component Value Date/Time   CALCIUM 7.9 (L) 09/10/2018 0758   CALCIUM 9.2 06/06/2014 1102   ALKPHOS 78 09/10/2018 0758   ALKPHOS 74 06/06/2014 1102   AST 16 09/10/2018 0758   AST 7 (L) 06/06/2014 1102   ALT 17 09/10/2018 0758   ALT 12 (L) 06/06/2014 1102   BILITOT 0.5 09/10/2018  0758   BILITOT 0.4 06/06/2014 1102       RADIOGRAPHIC STUDIES: I have personally reviewed the radiological images as listed and agreed with the findings in the report. No results found.   ASSESSMENT & PLAN:  Carcinoma of upper-inner quadrant of left breast in female, estrogen receptor positive ([place]) Left breast cancer- stage IV- ER/PR +, HER-2/neu - on Taxol chemotherapy; NOV 4th 2019- PET- overall STABLE; RLL nodule- clinically rounded atelectasis/improved; but incidental pancreatic uptake [hx of pancreatitis]. Stable.   # Continue Taxol 12m/m2; Labs today reviewed;  Acceptable for treatment today. Will order PET  At next visit. Discussed with patient; ca-27-29-overall stable.   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   # Bil LE swelling/ededma MILD- discussed re: compression stocking/ leg- stable.   # Chronic kidney disease - stage IV- creatinine2.8-stable.   # Type 2 DM- insulin dependent/stable.   # Anemia- hemoglobin today-9-10; stable  on PO iron.   #  DISPOSITION:  # Treatment today; # 1 week- cbc/bmp-Taxol # follow up in 2 weeks/labs/MD-cbc/cmp-ca-27-29--/Taxol-Dr.B   Orders Placed This Encounter  Procedures  . CBC with Differential/Platelet     Standing Status:   Standing    Number of Occurrences:   20    Standing Expiration Date:   09/04/2019  . Comprehensive metabolic panel    Standing Status:   Standing    Number of Occurrences:   20    Standing Expiration Date:   09/04/2019  . Cancer antigen 27.29    Standing Status:   Standing    Number of Occurrences:   20    Standing Expiration Date:   09/04/2019   All questions were answered. The patient knows to call the clinic with any problems, questions or concerns.      GCammie Sickle MD 09/10/2018 8:41 AM

## 2018-09-03 NOTE — Assessment & Plan Note (Addendum)
Left breast cancer- stage IV- ER/PR +, HER-2/neu - on Taxol chemotherapy; NOV 4th 2019- PET- overall STABLE; RLL nodule- clinically rounded atelectasis/improved; but incidental pancreatic uptake [hx of pancreatitis]. Stable.   # Continue Taxol 74m/m2; Labs today reviewed;  Acceptable for treatment today. Will order PET  At next visit. Discussed with patient; ca-27-29-overall stable.   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   # Bil LE swelling/ededma MILD- discussed re: compression stocking/ leg- stable.   # Chronic kidney disease - stage IV- creatinine2.8-stable.   # Type 2 DM- insulin dependent/stable.   # Anemia- hemoglobin today-9-10; stable  on PO iron.   #  DISPOSITION:  # Treatment today; # 1 week- cbc/bmp-Taxol # follow up in 2 weeks/labs/MD-cbc/cmp-ca-27-29--/Taxol-Dr.B

## 2018-09-04 LAB — CANCER ANTIGEN 27.29: CAN 27.29: 54.8 U/mL — AB (ref 0.0–38.6)

## 2018-09-10 ENCOUNTER — Inpatient Hospital Stay: Payer: Medicare Other | Attending: Internal Medicine

## 2018-09-10 ENCOUNTER — Inpatient Hospital Stay: Payer: Medicare Other

## 2018-09-10 VITALS — BP 128/78 | HR 71 | Temp 97.1°F | Resp 18 | Wt 210.0 lb

## 2018-09-10 DIAGNOSIS — E11319 Type 2 diabetes mellitus with unspecified diabetic retinopathy without macular edema: Secondary | ICD-10-CM | POA: Insufficient documentation

## 2018-09-10 DIAGNOSIS — D649 Anemia, unspecified: Secondary | ICD-10-CM | POA: Diagnosis not present

## 2018-09-10 DIAGNOSIS — C50212 Malignant neoplasm of upper-inner quadrant of left female breast: Secondary | ICD-10-CM | POA: Diagnosis not present

## 2018-09-10 DIAGNOSIS — N184 Chronic kidney disease, stage 4 (severe): Secondary | ICD-10-CM | POA: Diagnosis not present

## 2018-09-10 DIAGNOSIS — Z5111 Encounter for antineoplastic chemotherapy: Secondary | ICD-10-CM | POA: Insufficient documentation

## 2018-09-10 DIAGNOSIS — Z170 Estrogen receptor positive status [ER+]: Secondary | ICD-10-CM | POA: Insufficient documentation

## 2018-09-10 DIAGNOSIS — Z7982 Long term (current) use of aspirin: Secondary | ICD-10-CM | POA: Diagnosis not present

## 2018-09-10 DIAGNOSIS — Z803 Family history of malignant neoplasm of breast: Secondary | ICD-10-CM | POA: Insufficient documentation

## 2018-09-10 DIAGNOSIS — Z79899 Other long term (current) drug therapy: Secondary | ICD-10-CM | POA: Insufficient documentation

## 2018-09-10 DIAGNOSIS — E1122 Type 2 diabetes mellitus with diabetic chronic kidney disease: Secondary | ICD-10-CM | POA: Insufficient documentation

## 2018-09-10 DIAGNOSIS — Z800 Family history of malignant neoplasm of digestive organs: Secondary | ICD-10-CM | POA: Diagnosis not present

## 2018-09-10 DIAGNOSIS — Z8041 Family history of malignant neoplasm of ovary: Secondary | ICD-10-CM | POA: Diagnosis not present

## 2018-09-10 DIAGNOSIS — I10 Essential (primary) hypertension: Secondary | ICD-10-CM | POA: Insufficient documentation

## 2018-09-10 DIAGNOSIS — Z853 Personal history of malignant neoplasm of breast: Secondary | ICD-10-CM | POA: Insufficient documentation

## 2018-09-10 DIAGNOSIS — Z87891 Personal history of nicotine dependence: Secondary | ICD-10-CM | POA: Insufficient documentation

## 2018-09-10 DIAGNOSIS — Z794 Long term (current) use of insulin: Secondary | ICD-10-CM | POA: Insufficient documentation

## 2018-09-10 LAB — COMPREHENSIVE METABOLIC PANEL
ALT: 17 U/L (ref 0–44)
AST: 16 U/L (ref 15–41)
Albumin: 3.5 g/dL (ref 3.5–5.0)
Alkaline Phosphatase: 78 U/L (ref 38–126)
Anion gap: 6 (ref 5–15)
BUN: 41 mg/dL — ABNORMAL HIGH (ref 8–23)
CO2: 23 mmol/L (ref 22–32)
Calcium: 7.9 mg/dL — ABNORMAL LOW (ref 8.9–10.3)
Chloride: 105 mmol/L (ref 98–111)
Creatinine, Ser: 2.97 mg/dL — ABNORMAL HIGH (ref 0.44–1.00)
GFR calc Af Amer: 19 mL/min — ABNORMAL LOW (ref >60)
GFR calc non Af Amer: 16 mL/min — ABNORMAL LOW (ref >60)
GLUCOSE: 117 mg/dL — AB (ref 70–99)
Potassium: 3.8 mmol/L (ref 3.5–5.1)
Sodium: 134 mmol/L — ABNORMAL LOW (ref 135–145)
Total Bilirubin: 0.5 mg/dL (ref 0.3–1.2)
Total Protein: 7 g/dL (ref 6.5–8.1)

## 2018-09-10 LAB — CBC WITH DIFFERENTIAL/PLATELET
Abs Immature Granulocytes: 0.08 10*3/uL — ABNORMAL HIGH (ref 0.00–0.07)
Basophils Absolute: 0 10*3/uL (ref 0.0–0.1)
Basophils Relative: 1 %
Eosinophils Absolute: 0 10*3/uL (ref 0.0–0.5)
Eosinophils Relative: 1 %
HCT: 28.2 % — ABNORMAL LOW (ref 36.0–46.0)
Hemoglobin: 9.5 g/dL — ABNORMAL LOW (ref 12.0–15.0)
Immature Granulocytes: 1 %
Lymphocytes Relative: 11 %
Lymphs Abs: 0.8 10*3/uL (ref 0.7–4.0)
MCH: 30.1 pg (ref 26.0–34.0)
MCHC: 33.7 g/dL (ref 30.0–36.0)
MCV: 89.2 fL (ref 80.0–100.0)
Monocytes Absolute: 0.5 10*3/uL (ref 0.1–1.0)
Monocytes Relative: 8 %
NEUTROS ABS: 5.3 10*3/uL (ref 1.7–7.7)
Neutrophils Relative %: 78 %
Platelets: 294 10*3/uL (ref 150–400)
RBC: 3.16 MIL/uL — AB (ref 3.87–5.11)
RDW: 13.2 % (ref 11.5–15.5)
WBC: 6.7 10*3/uL (ref 4.0–10.5)
nRBC: 0 % (ref 0.0–0.2)

## 2018-09-10 MED ORDER — FAMOTIDINE IN NACL 20-0.9 MG/50ML-% IV SOLN
20.0000 mg | Freq: Once | INTRAVENOUS | Status: AC
  Administered 2018-09-10: 20 mg via INTRAVENOUS
  Filled 2018-09-10: qty 50

## 2018-09-10 MED ORDER — HEPARIN SOD (PORK) LOCK FLUSH 100 UNIT/ML IV SOLN
500.0000 [IU] | Freq: Once | INTRAVENOUS | Status: AC | PRN
  Administered 2018-09-10: 500 [IU]

## 2018-09-10 MED ORDER — SODIUM CHLORIDE 0.9 % IV SOLN
Freq: Once | INTRAVENOUS | Status: AC
  Administered 2018-09-10: 09:00:00 via INTRAVENOUS
  Filled 2018-09-10: qty 250

## 2018-09-10 MED ORDER — SODIUM CHLORIDE 0.9 % IV SOLN
60.0000 mg/m2 | Freq: Once | INTRAVENOUS | Status: AC
  Administered 2018-09-10: 114 mg via INTRAVENOUS
  Filled 2018-09-10: qty 19

## 2018-09-10 MED ORDER — DIPHENHYDRAMINE HCL 50 MG/ML IJ SOLN
50.0000 mg | Freq: Once | INTRAMUSCULAR | Status: AC
  Administered 2018-09-10: 50 mg via INTRAVENOUS
  Filled 2018-09-10: qty 1

## 2018-09-10 MED ORDER — DEXAMETHASONE SODIUM PHOSPHATE 10 MG/ML IJ SOLN
8.0000 mg | Freq: Once | INTRAMUSCULAR | Status: AC
  Administered 2018-09-10: 8 mg via INTRAVENOUS
  Filled 2018-09-10: qty 1

## 2018-09-11 LAB — CANCER ANTIGEN 27.29: CA 27.29: 59.2 U/mL — ABNORMAL HIGH (ref 0.0–38.6)

## 2018-09-13 DIAGNOSIS — D649 Anemia, unspecified: Secondary | ICD-10-CM | POA: Diagnosis not present

## 2018-09-13 DIAGNOSIS — N184 Chronic kidney disease, stage 4 (severe): Secondary | ICD-10-CM | POA: Diagnosis not present

## 2018-09-13 DIAGNOSIS — E119 Type 2 diabetes mellitus without complications: Secondary | ICD-10-CM | POA: Diagnosis not present

## 2018-09-13 DIAGNOSIS — Z794 Long term (current) use of insulin: Secondary | ICD-10-CM | POA: Diagnosis not present

## 2018-09-13 DIAGNOSIS — I5022 Chronic systolic (congestive) heart failure: Secondary | ICD-10-CM | POA: Diagnosis not present

## 2018-09-13 DIAGNOSIS — I340 Nonrheumatic mitral (valve) insufficiency: Secondary | ICD-10-CM | POA: Diagnosis not present

## 2018-09-13 DIAGNOSIS — F3340 Major depressive disorder, recurrent, in remission, unspecified: Secondary | ICD-10-CM | POA: Insufficient documentation

## 2018-09-13 DIAGNOSIS — I10 Essential (primary) hypertension: Secondary | ICD-10-CM | POA: Diagnosis not present

## 2018-09-13 DIAGNOSIS — E1122 Type 2 diabetes mellitus with diabetic chronic kidney disease: Secondary | ICD-10-CM | POA: Diagnosis not present

## 2018-09-13 DIAGNOSIS — C50412 Malignant neoplasm of upper-outer quadrant of left female breast: Secondary | ICD-10-CM | POA: Diagnosis not present

## 2018-09-13 DIAGNOSIS — C50919 Malignant neoplasm of unspecified site of unspecified female breast: Secondary | ICD-10-CM | POA: Diagnosis not present

## 2018-09-13 DIAGNOSIS — C799 Secondary malignant neoplasm of unspecified site: Secondary | ICD-10-CM | POA: Diagnosis not present

## 2018-09-17 ENCOUNTER — Inpatient Hospital Stay (HOSPITAL_BASED_OUTPATIENT_CLINIC_OR_DEPARTMENT_OTHER): Payer: Medicare Other | Admitting: Internal Medicine

## 2018-09-17 ENCOUNTER — Other Ambulatory Visit: Payer: Self-pay

## 2018-09-17 ENCOUNTER — Inpatient Hospital Stay: Payer: Medicare Other

## 2018-09-17 ENCOUNTER — Encounter: Payer: Self-pay | Admitting: Internal Medicine

## 2018-09-17 VITALS — BP 135/83 | HR 74 | Temp 97.6°F | Resp 20 | Ht 62.0 in | Wt 210.0 lb

## 2018-09-17 DIAGNOSIS — C50212 Malignant neoplasm of upper-inner quadrant of left female breast: Secondary | ICD-10-CM

## 2018-09-17 DIAGNOSIS — E1122 Type 2 diabetes mellitus with diabetic chronic kidney disease: Secondary | ICD-10-CM

## 2018-09-17 DIAGNOSIS — D649 Anemia, unspecified: Secondary | ICD-10-CM | POA: Diagnosis not present

## 2018-09-17 DIAGNOSIS — E1169 Type 2 diabetes mellitus with other specified complication: Secondary | ICD-10-CM | POA: Diagnosis not present

## 2018-09-17 DIAGNOSIS — Z8041 Family history of malignant neoplasm of ovary: Secondary | ICD-10-CM | POA: Diagnosis not present

## 2018-09-17 DIAGNOSIS — N184 Chronic kidney disease, stage 4 (severe): Secondary | ICD-10-CM

## 2018-09-17 DIAGNOSIS — Z79899 Other long term (current) drug therapy: Secondary | ICD-10-CM | POA: Diagnosis not present

## 2018-09-17 DIAGNOSIS — Z170 Estrogen receptor positive status [ER+]: Principal | ICD-10-CM

## 2018-09-17 DIAGNOSIS — Z803 Family history of malignant neoplasm of breast: Secondary | ICD-10-CM | POA: Diagnosis not present

## 2018-09-17 DIAGNOSIS — Z794 Long term (current) use of insulin: Secondary | ICD-10-CM | POA: Diagnosis not present

## 2018-09-17 DIAGNOSIS — Z87891 Personal history of nicotine dependence: Secondary | ICD-10-CM

## 2018-09-17 DIAGNOSIS — Z7982 Long term (current) use of aspirin: Secondary | ICD-10-CM | POA: Diagnosis not present

## 2018-09-17 DIAGNOSIS — Z5111 Encounter for antineoplastic chemotherapy: Secondary | ICD-10-CM | POA: Diagnosis not present

## 2018-09-17 DIAGNOSIS — Z800 Family history of malignant neoplasm of digestive organs: Secondary | ICD-10-CM

## 2018-09-17 DIAGNOSIS — E11319 Type 2 diabetes mellitus with unspecified diabetic retinopathy without macular edema: Secondary | ICD-10-CM | POA: Diagnosis not present

## 2018-09-17 LAB — COMPREHENSIVE METABOLIC PANEL
ALT: 17 U/L (ref 0–44)
AST: 15 U/L (ref 15–41)
Albumin: 3.6 g/dL (ref 3.5–5.0)
Alkaline Phosphatase: 81 U/L (ref 38–126)
Anion gap: 11 (ref 5–15)
BUN: 31 mg/dL — ABNORMAL HIGH (ref 8–23)
CO2: 21 mmol/L — ABNORMAL LOW (ref 22–32)
CREATININE: 2.83 mg/dL — AB (ref 0.44–1.00)
Calcium: 8.5 mg/dL — ABNORMAL LOW (ref 8.9–10.3)
Chloride: 105 mmol/L (ref 98–111)
GFR calc Af Amer: 20 mL/min — ABNORMAL LOW (ref >60)
GFR calc non Af Amer: 17 mL/min — ABNORMAL LOW (ref >60)
Glucose, Bld: 69 mg/dL — ABNORMAL LOW (ref 70–99)
Potassium: 4.2 mmol/L (ref 3.5–5.1)
Sodium: 137 mmol/L (ref 135–145)
Total Bilirubin: 0.4 mg/dL (ref 0.3–1.2)
Total Protein: 7.4 g/dL (ref 6.5–8.1)

## 2018-09-17 LAB — LIPID PANEL
Cholesterol: 141 mg/dL (ref 0–200)
HDL: 36 mg/dL — ABNORMAL LOW (ref >40)
LDL CALC: 77 mg/dL (ref 0–99)
Total CHOL/HDL Ratio: 3.9 RATIO
Triglycerides: 139 mg/dL (ref <150)
VLDL: 28 mg/dL (ref 0–40)

## 2018-09-17 LAB — CBC WITH DIFFERENTIAL/PLATELET
Abs Immature Granulocytes: 0.15 10*3/uL — ABNORMAL HIGH (ref 0.00–0.07)
Basophils Absolute: 0 10*3/uL (ref 0.0–0.1)
Basophils Relative: 0 %
EOS ABS: 0.1 10*3/uL (ref 0.0–0.5)
Eosinophils Relative: 1 %
HCT: 29 % — ABNORMAL LOW (ref 36.0–46.0)
Hemoglobin: 9.8 g/dL — ABNORMAL LOW (ref 12.0–15.0)
IMMATURE GRANULOCYTES: 2 %
LYMPHS ABS: 1.4 10*3/uL (ref 0.7–4.0)
Lymphocytes Relative: 16 %
MCH: 30.3 pg (ref 26.0–34.0)
MCHC: 33.8 g/dL (ref 30.0–36.0)
MCV: 89.8 fL (ref 80.0–100.0)
Monocytes Absolute: 0.7 10*3/uL (ref 0.1–1.0)
Monocytes Relative: 7 %
Neutro Abs: 6.5 10*3/uL (ref 1.7–7.7)
Neutrophils Relative %: 74 %
Platelets: 357 10*3/uL (ref 150–400)
RBC: 3.23 MIL/uL — ABNORMAL LOW (ref 3.87–5.11)
RDW: 13.2 % (ref 11.5–15.5)
WBC: 8.8 10*3/uL (ref 4.0–10.5)
nRBC: 0 % (ref 0.0–0.2)

## 2018-09-17 LAB — HEMOGLOBIN A1C
Hgb A1c MFr Bld: 9 % — ABNORMAL HIGH (ref 4.8–5.6)
MEAN PLASMA GLUCOSE: 211.6 mg/dL

## 2018-09-17 MED ORDER — HEPARIN SOD (PORK) LOCK FLUSH 100 UNIT/ML IV SOLN
500.0000 [IU] | Freq: Once | INTRAVENOUS | Status: AC | PRN
  Administered 2018-09-17: 500 [IU]
  Filled 2018-09-17: qty 5

## 2018-09-17 MED ORDER — SODIUM CHLORIDE 0.9 % IV SOLN
60.0000 mg/m2 | Freq: Once | INTRAVENOUS | Status: AC
  Administered 2018-09-17: 114 mg via INTRAVENOUS
  Filled 2018-09-17: qty 19

## 2018-09-17 MED ORDER — SODIUM CHLORIDE 0.9% FLUSH
10.0000 mL | INTRAVENOUS | Status: DC | PRN
  Filled 2018-09-17: qty 10

## 2018-09-17 MED ORDER — DIPHENHYDRAMINE HCL 50 MG/ML IJ SOLN
50.0000 mg | Freq: Once | INTRAMUSCULAR | Status: AC
  Administered 2018-09-17: 50 mg via INTRAVENOUS
  Filled 2018-09-17: qty 1

## 2018-09-17 MED ORDER — FAMOTIDINE IN NACL 20-0.9 MG/50ML-% IV SOLN
20.0000 mg | Freq: Once | INTRAVENOUS | Status: AC
  Administered 2018-09-17: 20 mg via INTRAVENOUS
  Filled 2018-09-17: qty 50

## 2018-09-17 MED ORDER — DEXAMETHASONE SODIUM PHOSPHATE 10 MG/ML IJ SOLN
8.0000 mg | Freq: Once | INTRAMUSCULAR | Status: AC
  Administered 2018-09-17: 8 mg via INTRAVENOUS
  Filled 2018-09-17: qty 1

## 2018-09-17 MED ORDER — SODIUM CHLORIDE 0.9 % IV SOLN
Freq: Once | INTRAVENOUS | Status: AC
  Administered 2018-09-17: 09:00:00 via INTRAVENOUS
  Filled 2018-09-17: qty 250

## 2018-09-17 NOTE — Progress Notes (Signed)
[place] OFFICE PROGRESS NOTE  Patient Care Team: [name], [name], [name]; [name]; [name]; [name]; EBUS- Positive Paratrac/subcarinal LN s/p ? Taxotere [in [place]; [name]; [name];[name]; [name]; suv 3]; [name]; [name]; [name];  [name]; Ant R iliac bone; [name]; [name]; [name]; [name]; [name]; mild left sided weakness]  # Jan 2020-  Lobular lesion on tongue- s/p excision pyogenic granuloma [Dr.McQueen]   # GENETIC TESTING/COUNSELLING: HETEROZYGOUS Cystic Fibrosis Gene [explains hx of recurrent pancreatitis]  # MOLECULAR TESTING: NA  ------------------------------------------------   DIAGNOSIS: [ 2015] BREAST CA; [name]; [name] ;GOALS: Palliative  CURRENT/MOST RECENT THERAPY [ April 2019] TAXOL      Carcinoma of upper-inner quadrant of left breast in female, estrogen receptor positive ([place])      INTERVAL HISTORY:  [name] 63 y.o.  female pleasant patient above history of ER PR positive HER-2 negative breast cancer on Taxol is here for follow-up.  Patient was recently  evaluated by PCP-found to have diabetic retinopathy.  Patient is upset about this new diagnosis.  However she is coping fairly well otherwise.  Continues to have mild tingling and numbness in extremities.  Not any worse.  No falls.  Mild swelling in the legs no worse.  Review of Systems  Constitutional: Positive for malaise/fatigue. Negative for chills, diaphoresis, [name], hemoptysis, sputum production, shortness of breath and wheezing.   Cardiovascular: Positive for leg swelling. Negative for chest pain, palpitations and orthopnea.  Gastrointestinal: Negative for abdominal pain, blood in stool, constipation, diarrhea, heartburn, melena, nausea and vomiting.  Genitourinary: Negative for dysuria, frequency and urgency.  Musculoskeletal: Negative for back pain and joint pain.  Skin: Negative.  Negative for itching and rash.  Neurological: Positive for tingling. Negative for dizziness, focal weakness, weakness and headaches.  Endo/Heme/Allergies: Does not bruise/bleed easily.  Psychiatric/Behavioral: Negative for depression. The patient is not nervous/anxious and does not have insomnia.       PAST MEDICAL HISTORY :  Past Medical History:  Diagnosis Date  . Anemia   . Asthma   . Cancer ([place]) 03/10/2018   Per NM PET order. Carcinoma of upper-inner quadrant of left breast in female, estrogen receptor positive .  Marland Kitchen CHF (congestive heart failure) ([place]) 1997  . CKD (chronic kidney disease)   . Depression   . Diabetes mellitus, type 2 ([place])   . Family history of breast cancer   . Family history of colon cancer   . Family history of ovarian cancer   . Family history  of pancreatic cancer   . Family history of prostate cancer   . Family history of stomach cancer   . Hair loss   . History of left breast cancer 05/29/14  . History of partial hysterectomy  12/31/2016   Per patient.  Has not had a period in years.  Had a partial hysterectomy years ago.  Marland Kitchen Hypertension   . Obesity   . Pancreatitis 1997  . Stroke [facility]) 2010   with mild left arm weakness    PAST SURGICAL HISTORY :   Past Surgical History:  Procedure Laterality Date  . CESAREAN SECTION    . CHOLECYSTECTOMY    . EXCISION OF TONGUE LESION N/A 08/17/2018   Procedure: EXCISION OF TONGUE LESION WITH FROZEN SECTIONS;  Surgeon: Beverly Gust, MD;  Location: [facility] ORS;  Service: ENT;  Laterality: N/A;  . PARTIAL HYSTERECTOMY  12/31/2016   Per patient, she has not had a period in years since she had a partial hysterectomy.    FAMILY HISTORY :   Family History  Problem Relation Age of Onset  . Ovarian cancer Mother 53  . Diabetes Mother   . Hypertension Mother   . COPD Father   . Hypertension Father   . Colon cancer Father 47  . Diabetes Sister   . Breast cancer Sister 83       bilateral  . Diabetes Brother   . Leukemia Maternal Aunt   . Pancreatic cancer Paternal Aunt 34  . Pancreatic cancer Paternal Uncle   . Colon cancer Paternal Uncle   . Stomach cancer Maternal Grandfather 80  . Throat cancer Paternal Grandmother   . Breast cancer Maternal Aunt 80  . Colon cancer Maternal Aunt   . Bone cancer Maternal Aunt   . Breast cancer Paternal Aunt        dx >50  . Prostate cancer Paternal Uncle   . Pancreatic cancer Paternal Uncle   . Throat cancer Paternal Uncle   . Lung cancer Paternal Uncle   . Stomach cancer Paternal Uncle   . Brain cancer Paternal Aunt   . Cancer Cousin        liver, kidney  . Prostate cancer Cousin        meastatic  . Lung cancer Other     SOCIAL HISTORY:   Social History   Tobacco Use  . Smoking status: Former Smoker    Packs/day: 0.50    Years: 1.00    Pack years: 0.50    Types: Cigarettes  . Smokeless tobacco: Never Used  Substance Use Topics  . Alcohol use: No    Alcohol/week: 0.0 standard drinks  . Drug use: No     ALLERGIES:  has No Known Allergies.  MEDICATIONS:  Current Outpatient Medications  Medication Sig Dispense Refill  . acetaminophen-codeine (TYLENOL #4) 300-60 MG tablet Take 2 tablets by mouth every 6 (six) hours as needed for moderate pain.     Marland Kitchen albuterol (PROAIR HFA) 108 (90 BASE) MCG/ACT inhaler Inhale 2 puffs into the lungs every 6 (six) hours as needed for wheezing or shortness of breath.     Marland Kitchen albuterol (PROVENTIL) (2.5 MG/3ML) 0.083% nebulizer solution Inhale 2.5 mg into the lungs every 6 (six) hours as needed for shortness of breath.     . ALPRAZolam (XANAX) 0.5 MG tablet Take 0.5 mg by mouth at bedtime as needed for anxiety or sleep.     Marland Kitchen amLODipine (NORVASC) 10 MG tablet Take 10 mg by mouth daily.     Marland Kitchen  aspirin EC 81 MG tablet Take 81 mg by mouth daily.     Marland Kitchen atenolol (TENORMIN) 100 MG tablet Take 100 mg by mouth 2 (two) times daily.     . B-D ULTRA-FINE 33 LANCETS MISC Use 1 each 2 (two) times daily.    . B-D ULTRAFINE III SHORT PEN 31G X 8 MM MISC     . bumetanide (BUMEX) 0.5 MG tablet Take 0.5 mg by mouth 2 (two) times daily.     . calcitRIOL (ROCALTROL) 0.25 MCG capsule Take 0.25 mcg by mouth every Monday, Wednesday, and Friday.     . Cinnamon 500 MG capsule Take 1,000 mg by mouth daily.     . cloNIDine (CATAPRES) 0.2 MG tablet Take 0.2 mg by mouth 2 (two) times daily.     . enalapril (VASOTEC) 20 MG tablet Take 20 mg by mouth 2 (two) times daily.     . ferrous sulfate 325 (65 FE) MG tablet Take 325 mg by mouth 2 (two) times daily with a meal.    . FLUoxetine (PROZAC) 20 MG capsule Take 20 mg by mouth 2 (two) times daily.     Marland Kitchen gabapentin (NEURONTIN) 100 MG capsule Take 1 capsule (100 mg total) by mouth at bedtime. 30 capsule 2  . glucose blood (ONE TOUCH ULTRA TEST) test strip Use 1 each 2 (two) times daily. Use as instructed.    . glyBURIDE (DIABETA) 5 MG tablet Take 10 mg by mouth 2 (two) times daily with a meal.     . LEVEMIR FLEXTOUCH 100 UNIT/ML Pen Inject 55 Units  into the skin daily.     Marland Kitchen loperamide (IMODIUM A-D) 2 MG capsule Take 2 mg by mouth as needed for diarrhea or loose stools.    . mometasone (NASONEX) 50 MCG/ACT nasal spray Place 2 sprays into the nose daily as needed (Allergies).     . NOVOLOG FLEXPEN 100 UNIT/ML FlexPen Inject 7 Units into the skin 2 (two) times daily.     . prochlorperazine (COMPAZINE) 10 MG tablet Take 1 tablet (10 mg total) by mouth every 6 (six) hours as needed for nausea or vomiting. Please note change in strength 60 tablet 4  . salmeterol (SEREVENT) 50 MCG/DOSE diskus inhaler Inhale 1 puff into the lungs daily as needed (shortness of breath).     . simvastatin (ZOCOR) 20 MG tablet Take 20 mg by mouth daily at 6 PM.     . vitamin B-12 (CYANOCOBALAMIN) 1000 MCG tablet Take 1,000 mcg by mouth daily.    . ondansetron (ZOFRAN) 8 MG tablet One pill every 8 hours as needed for nausea/vomitting. (Patient not taking: Reported on 09/17/2018) 40 tablet 1   No current facility-administered medications for this visit.    Facility-Administered Medications Ordered in Other Visits  Medication Dose Route Frequency Provider Last Rate Last Dose  . sodium chloride flush (NS) 0.9 % injection 10 mL  10 mL Intravenous PRN Cammie Sickle, MD   10 mL at 01/30/16 1054    PHYSICAL EXAMINATION: ECOG PERFORMANCE STATUS: 1 - Symptomatic but completely ambulatory  BP 135/83 (BP Location: Left Arm, Patient Position: Sitting)   Pulse 74   Temp 97.6 F (36.4 C) (Tympanic)   Resp 20   Ht 5' 2"  (1.575 m)   Wt 210 lb (95.3 kg)   BMI 38.41 kg/m   Filed Weights   09/17/18 0842  Weight: 210 lb (95.3 kg)    Physical Exam  Constitutional: She is oriented to person, place,  and time and well-developed, well-nourished, and in no distress.  She is alone.  HENT:  Head: Normocephalic and atraumatic.  Mouth/Throat: Oropharynx is clear and moist. No oropharyngeal exudate.  A polypoid approximate 2 cm lesion noted dorsal surface of the tongue.   No active bleeding.  Eyes: Pupils are equal, round, and reactive to light.  Neck: Normal range of motion. Neck supple.  Cardiovascular: Normal rate and regular rhythm.  Pulmonary/Chest: No respiratory distress. She has no wheezes.  Abdominal: Soft. Bowel sounds are normal. She exhibits no distension and no mass. There is no abdominal tenderness. There is no rebound and no guarding.  Musculoskeletal: Normal range of motion.        General: No tenderness.  Neurological: She is alert and oriented to person, place, and time.  Skin: Skin is warm.  Psychiatric: Affect normal.       LABORATORY DATA:  I have reviewed the data as listed    Component Value Date/Time   NA 137 09/17/2018 0758   NA 130 (L) 06/06/2014 1102   K 4.2 09/17/2018 0758   K 3.9 06/06/2014 1102   CL 105 09/17/2018 0758   CL 95 (L) 06/06/2014 1102   CO2 21 (L) 09/17/2018 0758   CO2 28 06/06/2014 1102   GLUCOSE 69 (L) 09/17/2018 0758   GLUCOSE 349 (H) 06/06/2014 1102   BUN 31 (H) 09/17/2018 0758   BUN 17 06/06/2014 1102   CREATININE 2.83 (H) 09/17/2018 0758   CREATININE 1.63 (H) 06/06/2014 1102   CALCIUM 8.5 (L) 09/17/2018 0758   CALCIUM 9.2 06/06/2014 1102   PROT 7.4 09/17/2018 0758   PROT 8.2 06/06/2014 1102   ALBUMIN 3.6 09/17/2018 0758   ALBUMIN 3.3 (L) 06/06/2014 1102   AST 15 09/17/2018 0758   AST 7 (L) 06/06/2014 1102   ALT 17 09/17/2018 0758   ALT 12 (L) 06/06/2014 1102   ALKPHOS 81 09/17/2018 0758   ALKPHOS 74 06/06/2014 1102   BILITOT 0.4 09/17/2018 0758   BILITOT 0.4 06/06/2014 1102   GFRNONAA 17 (L) 09/17/2018 0758   GFRNONAA 35 (L) 06/06/2014 1102   GFRAA 20 (L) 09/17/2018 0758   GFRAA 42 (L) 06/06/2014 1102    No results found for: SPEP, UPEP  Lab Results  Component Value Date   WBC 8.8 09/17/2018   NEUTROABS 6.5 09/17/2018   HGB 9.8 (L) 09/17/2018   HCT 29.0 (L) 09/17/2018   MCV 89.8 09/17/2018   PLT 357 09/17/2018      Chemistry      Component Value Date/Time   NA 137  09/17/2018 0758   NA 130 (L) 06/06/2014 1102   K 4.2 09/17/2018 0758   K 3.9 06/06/2014 1102   CL 105 09/17/2018 0758   CL 95 (L) 06/06/2014 1102   CO2 21 (L) 09/17/2018 0758   CO2 28 06/06/2014 1102   BUN 31 (H) 09/17/2018 0758   BUN 17 06/06/2014 1102   CREATININE 2.83 (H) 09/17/2018 0758   CREATININE 1.63 (H) 06/06/2014 1102      Component Value Date/Time   CALCIUM 8.5 (L) 09/17/2018 0758   CALCIUM 9.2 06/06/2014 1102   ALKPHOS 81 09/17/2018 0758   ALKPHOS 74 06/06/2014 1102   AST 15 09/17/2018 0758   AST 7 (L) 06/06/2014 1102   ALT 17 09/17/2018 0758   ALT 12 (L) 06/06/2014 1102   BILITOT 0.4 09/17/2018 0758   BILITOT 0.4 06/06/2014 1102       RADIOGRAPHIC STUDIES: I have personally reviewed the  radiological images as listed and agreed with the findings in the report. No results found.   ASSESSMENT & PLAN:  Carcinoma of upper-inner quadrant of left breast in female, estrogen receptor positive ([place]) Left breast cancer- stage IV- ER/PR +, HER-2/neu - on Taxol chemotherapy; NOV 4th 2019- PET- overall STABLE; RLL nodule- clinically rounded atelectasis/improved; but incidental pancreatic uptake [hx of pancreatitis]. Stable.   # Continue Taxol 56m/m2; Labs today reviewed;  Acceptable for treatment today. Will order PET scan today.  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   # Bil LE swelling/ededma MILD- discussed re: compression stocking/ leg- stable.   # Chronic kidney disease - stage IV- creatinine2.8- stable.   # Type 2 DM- insulin dependent; stable; but newly diagnosed to DM retinopathy.   # Anemia- hemoglobin today-9-10; STABLE.  on PO iron.   #  DISPOSITION:  # Treatment today; # follow up in 2 weeks/labs/MD-cbc/cmp-ca-27-29; PET prior-Taxol-Dr.B   Orders Placed This Encounter  Procedures  . NM PET Image Restag (PS) Skull Base To Thigh    Standing Status:   Future    Standing Expiration Date:   09/17/2019    Order Specific Question:   **  REASON FOR EXAM (FREE TEXT)    Answer:   breast cancer; surveillaince    Order Specific Question:   If indicated for the ordered procedure, I authorize the administration of a radiopharmaceutical per Radiology protocol    Answer:   Yes    Order Specific Question:   Preferred imaging location?    Answer:   [facility]    Order Specific Question:   Radiology Contrast Protocol - do NOT remove file path    Answer:   [email or web address]  . Lipid panel    Standing Status:   Future    Number of Occurrences:   1    Standing Expiration Date:   09/17/2019  . Hemoglobin A1c    Standing Status:   Future    Number of Occurrences:   1    Standing Expiration Date:   09/17/2019   All questions were answered. The patient knows to call the clinic with any problems, questions or concerns.      GCammie Sickle MD 09/19/2018 7:36 PM

## 2018-09-17 NOTE — Assessment & Plan Note (Addendum)
Left breast cancer- stage IV- ER/PR +, HER-2/neu - on Taxol chemotherapy; NOV 4th 2019- PET- overall STABLE; RLL nodule- clinically rounded atelectasis/improved; but incidental pancreatic uptake [hx of pancreatitis]. Stable.   # Continue Taxol 46m/m2; Labs today reviewed;  Acceptable for treatment today. Will order PET scan today.  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   # Bil LE swelling/ededma MILD- discussed re: compression stocking/ leg- stable.   # Chronic kidney disease - stage IV- creatinine2.8- stable.   # Type 2 DM- insulin dependent; stable; but newly diagnosed to DM retinopathy.   # Anemia- hemoglobin today-9-10; STABLE.  on PO iron.   #  DISPOSITION:  # Treatment today; # follow up in 2 weeks/labs/MD-cbc/cmp-ca-27-29; PET prior-Taxol-Dr.B

## 2018-09-18 LAB — CANCER ANTIGEN 27.29: CAN 27.29: 56 U/mL — AB (ref 0.0–38.6)

## 2018-09-29 ENCOUNTER — Ambulatory Visit
Admission: RE | Admit: 2018-09-29 | Discharge: 2018-09-29 | Disposition: A | Discharge status: Home / Self Care | Payer: Medicare Other | Source: Ambulatory Visit | Attending: Internal Medicine | Admitting: Internal Medicine

## 2018-09-29 DIAGNOSIS — E119 Type 2 diabetes mellitus without complications: Secondary | ICD-10-CM | POA: Insufficient documentation

## 2018-09-29 DIAGNOSIS — I700 Atherosclerosis of aorta: Secondary | ICD-10-CM | POA: Diagnosis not present

## 2018-09-29 DIAGNOSIS — C7951 Secondary malignant neoplasm of bone: Secondary | ICD-10-CM | POA: Diagnosis not present

## 2018-09-29 DIAGNOSIS — C50212 Malignant neoplasm of upper-inner quadrant of left female breast: Secondary | ICD-10-CM | POA: Insufficient documentation

## 2018-09-29 DIAGNOSIS — Z170 Estrogen receptor positive status [ER+]: Secondary | ICD-10-CM | POA: Insufficient documentation

## 2018-09-29 DIAGNOSIS — Z853 Personal history of malignant neoplasm of breast: Secondary | ICD-10-CM | POA: Diagnosis not present

## 2018-09-29 LAB — GLUCOSE, CAPILLARY: Glucose-Capillary: 70 mg/dL (ref 70–99)

## 2018-09-29 MED ORDER — FLUDEOXYGLUCOSE F - 18 (FDG) INJECTION
11.7400 | Freq: Once | INTRAVENOUS | Status: AC | PRN
  Administered 2018-09-29: 11.74 via INTRAVENOUS

## 2018-10-01 ENCOUNTER — Inpatient Hospital Stay: Payer: Medicare Other

## 2018-10-01 ENCOUNTER — Encounter: Payer: Self-pay | Admitting: Internal Medicine

## 2018-10-01 ENCOUNTER — Inpatient Hospital Stay (HOSPITAL_BASED_OUTPATIENT_CLINIC_OR_DEPARTMENT_OTHER): Payer: Medicare Other | Admitting: Internal Medicine

## 2018-10-01 VITALS — BP 153/89 | HR 71 | Temp 98.5°F | Resp 16 | Wt 209.6 lb

## 2018-10-01 DIAGNOSIS — Z79899 Other long term (current) drug therapy: Secondary | ICD-10-CM | POA: Diagnosis not present

## 2018-10-01 DIAGNOSIS — E11319 Type 2 diabetes mellitus with unspecified diabetic retinopathy without macular edema: Secondary | ICD-10-CM

## 2018-10-01 DIAGNOSIS — Z8041 Family history of malignant neoplasm of ovary: Secondary | ICD-10-CM | POA: Diagnosis not present

## 2018-10-01 DIAGNOSIS — E1122 Type 2 diabetes mellitus with diabetic chronic kidney disease: Secondary | ICD-10-CM | POA: Diagnosis not present

## 2018-10-01 DIAGNOSIS — N184 Chronic kidney disease, stage 4 (severe): Secondary | ICD-10-CM | POA: Diagnosis not present

## 2018-10-01 DIAGNOSIS — D649 Anemia, unspecified: Secondary | ICD-10-CM

## 2018-10-01 DIAGNOSIS — Z5111 Encounter for antineoplastic chemotherapy: Secondary | ICD-10-CM | POA: Diagnosis not present

## 2018-10-01 DIAGNOSIS — Z87891 Personal history of nicotine dependence: Secondary | ICD-10-CM | POA: Diagnosis not present

## 2018-10-01 DIAGNOSIS — Z794 Long term (current) use of insulin: Secondary | ICD-10-CM

## 2018-10-01 DIAGNOSIS — Z170 Estrogen receptor positive status [ER+]: Secondary | ICD-10-CM | POA: Diagnosis not present

## 2018-10-01 DIAGNOSIS — Z7189 Other specified counseling: Secondary | ICD-10-CM | POA: Insufficient documentation

## 2018-10-01 DIAGNOSIS — Z7982 Long term (current) use of aspirin: Secondary | ICD-10-CM

## 2018-10-01 DIAGNOSIS — Z800 Family history of malignant neoplasm of digestive organs: Secondary | ICD-10-CM

## 2018-10-01 DIAGNOSIS — C50212 Malignant neoplasm of upper-inner quadrant of left female breast: Secondary | ICD-10-CM | POA: Diagnosis not present

## 2018-10-01 DIAGNOSIS — Z803 Family history of malignant neoplasm of breast: Secondary | ICD-10-CM

## 2018-10-01 LAB — COMPREHENSIVE METABOLIC PANEL
ALK PHOS: 80 U/L (ref 38–126)
ALT: 15 U/L (ref 0–44)
ANION GAP: 8 (ref 5–15)
AST: 16 U/L (ref 15–41)
Albumin: 3.7 g/dL (ref 3.5–5.0)
BUN: 33 mg/dL — ABNORMAL HIGH (ref 8–23)
CO2: 23 mmol/L (ref 22–32)
Calcium: 8.4 mg/dL — ABNORMAL LOW (ref 8.9–10.3)
Chloride: 102 mmol/L (ref 98–111)
Creatinine, Ser: 3.15 mg/dL — ABNORMAL HIGH (ref 0.44–1.00)
GFR calc Af Amer: 18 mL/min — ABNORMAL LOW (ref >60)
GFR calc non Af Amer: 15 mL/min — ABNORMAL LOW (ref >60)
GLUCOSE: 126 mg/dL — AB (ref 70–99)
Potassium: 4.5 mmol/L (ref 3.5–5.1)
Sodium: 133 mmol/L — ABNORMAL LOW (ref 135–145)
Total Bilirubin: 0.7 mg/dL (ref 0.3–1.2)
Total Protein: 7.4 g/dL (ref 6.5–8.1)

## 2018-10-01 LAB — CBC WITH DIFFERENTIAL/PLATELET
Abs Immature Granulocytes: 0.35 10*3/uL — ABNORMAL HIGH (ref 0.00–0.07)
Basophils Absolute: 0.1 10*3/uL (ref 0.0–0.1)
Basophils Relative: 1 %
Eosinophils Absolute: 0.1 10*3/uL (ref 0.0–0.5)
Eosinophils Relative: 1 %
HCT: 29 % — ABNORMAL LOW (ref 36.0–46.0)
Hemoglobin: 9.9 g/dL — ABNORMAL LOW (ref 12.0–15.0)
Immature Granulocytes: 4 %
Lymphocytes Relative: 15 %
Lymphs Abs: 1.4 10*3/uL (ref 0.7–4.0)
MCH: 30.9 pg (ref 26.0–34.0)
MCHC: 34.1 g/dL (ref 30.0–36.0)
MCV: 90.6 fL (ref 80.0–100.0)
Monocytes Absolute: 1.2 10*3/uL — ABNORMAL HIGH (ref 0.1–1.0)
Monocytes Relative: 13 %
Neutro Abs: 6 10*3/uL (ref 1.7–7.7)
Neutrophils Relative %: 66 %
Platelets: 314 10*3/uL (ref 150–400)
RBC: 3.2 MIL/uL — ABNORMAL LOW (ref 3.87–5.11)
RDW: 13.1 % (ref 11.5–15.5)
WBC: 9 10*3/uL (ref 4.0–10.5)
nRBC: 0.4 % — ABNORMAL HIGH (ref 0.0–0.2)

## 2018-10-01 MED ORDER — SODIUM CHLORIDE 0.9% FLUSH
10.0000 mL | Freq: Once | INTRAVENOUS | Status: AC
  Administered 2018-10-01: 10 mL via INTRAVENOUS
  Filled 2018-10-01: qty 10

## 2018-10-01 MED ORDER — HEPARIN SOD (PORK) LOCK FLUSH 100 UNIT/ML IV SOLN
500.0000 [IU] | Freq: Once | INTRAVENOUS | Status: AC
  Administered 2018-10-01: 500 [IU] via INTRAVENOUS

## 2018-10-01 NOTE — Assessment & Plan Note (Addendum)
Left breast cancer- stage IV- ER/PR +, HER-2/neu - on Taxol chemotherapy; September 29, 2018 PET scan-stable bone lesions/left breast mass; decreasing effusions/left lung atelectasis; prominent right pelvic wall lymph node[see discussion below].  # HOLD chemo today- sec to PN/stable PET scan. We will resume  Taxol 17m/m2 at next visit.  Also discussed doing treatments every other week.    # Right pelvic wall lymph node; new -[on PET ]approximately centimeter in size; prominent uptake on PET scan.  Question etiology monitor for now.  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 See above.   # Bil LE swelling/ededma MILD- discussed re: compression stocking/ leg-stable.   # Chronic kidney disease - stage IV-STABLE.   # Type 2 DM- insulin dependent; STABLE.   # Anemia- hemoglobin today-9-10; STABLE.  on PO iron.   #  DISPOSITION:  # HOLD Treatment today; # follow up in 2 weeks/labs/MD-cbc/cmp-ca-27-29;-Taxol-Dr.B

## 2018-10-01 NOTE — Progress Notes (Signed)
[facility] OFFICE PROGRESS NOTE  Patient Care Team: [name], [name], [name]; [name]; [name]; [name]; EBUS- Positive Paratrac/subcarinal LN s/p ? Taxotere [in [place]; [name]; [name];[name]; [name]; suv 3]; [name]; [name]; [name];  [name]; Ant R iliac bone; [name]; [name]; [name]; [name]; [name]; mild left sided weakness]  # Jan 2020-  Lobular lesion on tongue- s/p excision pyogenic granuloma [Dr.McQueen]   # GENETIC TESTING/COUNSELLING: HETEROZYGOUS Cystic Fibrosis Gene [explains hx of recurrent pancreatitis]  # MOLECULAR TESTING: NA  ------------------------------------------------   DIAGNOSIS: [ 2015] BREAST CA; [name]; [name] ;GOALS: Palliative  CURRENT/MOST RECENT THERAPY [ April 2019] TAXOL      Carcinoma of upper-inner quadrant of left breast in female, estrogen receptor positive ([place])      INTERVAL HISTORY:  Gloria A [place] 63 y.o.  female pleasant patient above history of ER PR positive HER-2 negative breast cancer on Taxol is here for follow-up.  Patient continues to  complain of tingling and numbness in the extremities gradually worsening.  She has not started her Neurontin yet.  Otherwise appetite is fair.  No weight loss.  No falls.   Review of Systems  Constitutional: Positive for malaise/fatigue. Negative for chills, diaphoresis, [name], hemoptysis, sputum production, shortness of breath and wheezing.   Cardiovascular: Positive for leg swelling. Negative for chest pain, palpitations and orthopnea.  Gastrointestinal: Negative for abdominal pain, blood in stool, constipation, diarrhea, heartburn, melena, nausea and vomiting.  Genitourinary: Negative for dysuria, frequency and urgency.  Musculoskeletal: Negative for back pain and joint pain.  Skin: Negative.  Negative for itching and rash.  Neurological: Positive for tingling. Negative for dizziness, focal weakness, weakness and headaches.  Endo/Heme/Allergies: Does not bruise/bleed easily.  Psychiatric/Behavioral: Negative for depression. The patient is not nervous/anxious and does not have insomnia.       PAST MEDICAL HISTORY :  Past Medical History:  Diagnosis Date  . Anemia   . Asthma   . Cancer ([place]) 03/10/2018   Per [facility] PET order. Carcinoma of upper-inner quadrant of left breast in female, estrogen receptor positive .  Marland Kitchen CHF (congestive heart failure) ([place]) 1997  . CKD (chronic kidney disease)   . Depression   . Diabetes mellitus, type 2 ([place])   . Family history of breast cancer   . Family history of colon cancer   . Family history of ovarian cancer   . Family history of pancreatic cancer   . Family history of prostate cancer   . Family history of  stomach cancer   . Hair loss   . History of left breast cancer 05/29/14  . History of partial hysterectomy 12/31/2016   Per patient.  Has not had a period in years.  Had a partial hysterectomy years ago.  Marland Kitchen  Hypertension   . Obesity   . Pancreatitis 1997  . Stroke [facility]) 2010   with mild left arm weakness    PAST SURGICAL HISTORY :   Past Surgical History:  Procedure Laterality Date  . CESAREAN SECTION    . CHOLECYSTECTOMY    . EXCISION OF TONGUE LESION N/A 08/17/2018   Procedure: EXCISION OF TONGUE LESION WITH FROZEN SECTIONS;  Surgeon: Beverly Gust, MD;  Location: [facility] ORS;  Service: ENT;  Laterality: N/A;  . PARTIAL HYSTERECTOMY  12/31/2016   Per patient, she has not had a period in years since she had a partial hysterectomy.    FAMILY HISTORY :   Family History  Problem Relation Age of Onset  . Ovarian cancer Mother 62  . Diabetes Mother   . Hypertension Mother   . COPD Father   . Hypertension Father   . Colon cancer Father 18  . Diabetes Sister   . Breast cancer Sister 17       bilateral  . Diabetes Brother   . Leukemia Maternal Aunt   . Pancreatic cancer Paternal Aunt 77  . Pancreatic cancer Paternal Uncle   . Colon cancer Paternal Uncle   . Stomach cancer Maternal Grandfather 40  . Throat cancer Paternal Grandmother   . Breast cancer Maternal Aunt 80  . Colon cancer Maternal Aunt   . Bone cancer Maternal Aunt   . Breast cancer Paternal Aunt        dx >50  . Prostate cancer Paternal Uncle   . Pancreatic cancer Paternal Uncle   . Throat cancer Paternal Uncle   . Lung cancer Paternal Uncle   . Stomach cancer Paternal Uncle   . Brain cancer Paternal Aunt   . Cancer Cousin        liver, kidney  . Prostate cancer Cousin        meastatic  . Lung cancer Other     SOCIAL HISTORY:   Social History   Tobacco Use  . Smoking status: Former Smoker    Packs/day: 0.50    Years: 1.00    Pack years: 0.50    Types: Cigarettes  . Smokeless tobacco: Never Used  Substance Use Topics  . Alcohol use: No    Alcohol/week: 0.0 standard drinks  . Drug use: No    ALLERGIES:  has No Known Allergies.  MEDICATIONS:  Current Outpatient Medications  Medication Sig  Dispense Refill  . acetaminophen-codeine (TYLENOL #4) 300-60 MG tablet Take 2 tablets by mouth every 6 (six) hours as needed for moderate pain.     Marland Kitchen albuterol (PROAIR HFA) 108 (90 BASE) MCG/ACT inhaler Inhale 2 puffs into the lungs every 6 (six) hours as needed for wheezing or shortness of breath.     Marland Kitchen albuterol (PROVENTIL) (2.5 MG/3ML) 0.083% nebulizer solution Inhale 2.5 mg into the lungs every 6 (six) hours as needed for shortness of breath.     . ALPRAZolam (XANAX) 0.5 MG tablet Take 0.5 mg by mouth at bedtime as needed for anxiety or sleep.     Marland Kitchen amLODipine (NORVASC) 10 MG tablet Take 10 mg by mouth daily.     Marland Kitchen aspirin EC 81 MG tablet Take 81 mg by mouth daily.     Marland Kitchen  atenolol (TENORMIN) 100 MG tablet Take 100 mg by mouth 2 (two) times daily.     . B-D ULTRA-FINE 33 LANCETS MISC Use 1 each 2 (two) times daily.    . B-D ULTRAFINE III SHORT PEN 31G X 8 MM MISC     . bumetanide (BUMEX) 0.5 MG tablet Take 0.5 mg by mouth 2 (two) times daily.     . calcitRIOL (ROCALTROL) 0.25 MCG capsule Take 0.25 mcg by mouth every Monday, Wednesday, and Friday.     . Cinnamon 500 MG capsule Take 1,000 mg by mouth daily.     . cloNIDine (CATAPRES) 0.2 MG tablet Take 0.2 mg by mouth 2 (two) times daily.     . enalapril (VASOTEC) 20 MG tablet Take 20 mg by mouth 2 (two) times daily.     . ferrous sulfate 325 (65 FE) MG tablet Take 325 mg by mouth 2 (two) times daily with a meal.    . FLUoxetine (PROZAC) 20 MG capsule Take 20 mg by mouth 2 (two) times daily.     Marland Kitchen gabapentin (NEURONTIN) 100 MG capsule Take 1 capsule (100 mg total) by mouth at bedtime. 30 capsule 2  . glucose blood (ONE TOUCH ULTRA TEST) test strip Use 1 each 2 (two) times daily. Use as instructed.    . glyBURIDE (DIABETA) 5 MG tablet Take 10 mg by mouth 2 (two) times daily with a meal.     . LEVEMIR FLEXTOUCH 100 UNIT/ML Pen Inject 55 Units into the skin daily.     Marland Kitchen loperamide (IMODIUM A-D) 2 MG capsule Take 2 mg by mouth as needed for diarrhea  or loose stools.    . mometasone (NASONEX) 50 MCG/ACT nasal spray Place 2 sprays into the nose daily as needed (Allergies).     . NOVOLOG FLEXPEN 100 UNIT/ML FlexPen Inject 7 Units into the skin 2 (two) times daily.     . ondansetron (ZOFRAN) 8 MG tablet One pill every 8 hours as needed for nausea/vomitting. (Patient not taking: Reported on 09/17/2018) 40 tablet 1  . prochlorperazine (COMPAZINE) 10 MG tablet Take 1 tablet (10 mg total) by mouth every 6 (six) hours as needed for nausea or vomiting. Please note change in strength 60 tablet 4  . salmeterol (SEREVENT) 50 MCG/DOSE diskus inhaler Inhale 1 puff into the lungs daily as needed (shortness of breath).     . simvastatin (ZOCOR) 20 MG tablet Take 20 mg by mouth daily at 6 PM.     . vitamin B-12 (CYANOCOBALAMIN) 1000 MCG tablet Take 1,000 mcg by mouth daily.     No current facility-administered medications for this visit.    Facility-Administered Medications Ordered in Other Visits  Medication Dose Route Frequency Provider Last Rate Last Dose  . sodium chloride flush (NS) 0.9 % injection 10 mL  10 mL Intravenous PRN Cammie Sickle, MD   10 mL at 01/30/16 1054    PHYSICAL EXAMINATION: ECOG PERFORMANCE STATUS: 1 - Symptomatic but completely ambulatory  BP (!) 153/89 (BP Location: Left Arm, Patient Position: Sitting, Cuff Size: Normal)   Pulse 71   Temp 98.5 F (36.9 C) (Tympanic)   Resp 16   Wt 209 lb 9.6 oz (95.1 kg)   BMI 38.34 kg/m   Filed Weights   10/01/18 0903  Weight: 209 lb 9.6 oz (95.1 kg)    Physical Exam  Constitutional: She is oriented to person, place, and time and well-developed, well-nourished, and in no distress.  She is alone.  HENT:  Head: Normocephalic and atraumatic.  Mouth/Throat: Oropharynx is clear and moist. No oropharyngeal exudate.  A polypoid approximate 2 cm lesion noted dorsal surface of the tongue.  No active bleeding.  Eyes: Pupils are equal, round, and reactive to light.  Neck: Normal  range of motion. Neck supple.  Cardiovascular: Normal rate and regular rhythm.  Pulmonary/Chest: No respiratory distress. She has no wheezes.  Abdominal: Soft. Bowel sounds are normal. She exhibits no distension and no mass. There is no abdominal tenderness. There is no rebound and no guarding.  Musculoskeletal: Normal range of motion.        General: No tenderness.  Neurological: She is alert and oriented to person, place, and time.  Skin: Skin is warm.  Psychiatric: Affect normal.       LABORATORY DATA:  I have reviewed the data as listed    Component Value Date/Time   NA 133 (L) 10/01/2018 0823   NA 130 (L) 06/06/2014 1102   K 4.5 10/01/2018 0823   K 3.9 06/06/2014 1102   CL 102 10/01/2018 0823   CL 95 (L) 06/06/2014 1102   CO2 23 10/01/2018 0823   CO2 28 06/06/2014 1102   GLUCOSE 126 (H) 10/01/2018 0823   GLUCOSE 349 (H) 06/06/2014 1102   BUN 33 (H) 10/01/2018 0823   BUN 17 06/06/2014 1102   CREATININE 3.15 (H) 10/01/2018 0823   CREATININE 1.63 (H) 06/06/2014 1102   CALCIUM 8.4 (L) 10/01/2018 0823   CALCIUM 9.2 06/06/2014 1102   PROT 7.4 10/01/2018 0823   PROT 8.2 06/06/2014 1102   ALBUMIN 3.7 10/01/2018 0823   ALBUMIN 3.3 (L) 06/06/2014 1102   AST 16 10/01/2018 0823   AST 7 (L) 06/06/2014 1102   ALT 15 10/01/2018 0823   ALT 12 (L) 06/06/2014 1102   ALKPHOS 80 10/01/2018 0823   ALKPHOS 74 06/06/2014 1102   BILITOT 0.7 10/01/2018 0823   BILITOT 0.4 06/06/2014 1102   GFRNONAA 15 (L) 10/01/2018 0823   GFRNONAA 35 (L) 06/06/2014 1102   GFRAA 18 (L) 10/01/2018 0823   GFRAA 42 (L) 06/06/2014 1102    No results found for: SPEP, UPEP  Lab Results  Component Value Date   WBC 9.0 10/01/2018   NEUTROABS 6.0 10/01/2018   HGB 9.9 (L) 10/01/2018   HCT 29.0 (L) 10/01/2018   MCV 90.6 10/01/2018   PLT 314 10/01/2018      Chemistry      Component Value Date/Time   NA 133 (L) 10/01/2018 0823   NA 130 (L) 06/06/2014 1102   K 4.5 10/01/2018 0823   K 3.9 06/06/2014  1102   CL 102 10/01/2018 0823   CL 95 (L) 06/06/2014 1102   CO2 23 10/01/2018 0823   CO2 28 06/06/2014 1102   BUN 33 (H) 10/01/2018 0823   BUN 17 06/06/2014 1102   CREATININE 3.15 (H) 10/01/2018 0823   CREATININE 1.63 (H) 06/06/2014 1102      Component Value Date/Time   CALCIUM 8.4 (L) 10/01/2018 0823   CALCIUM 9.2 06/06/2014 1102   ALKPHOS 80 10/01/2018 0823   ALKPHOS 74 06/06/2014 1102   AST 16 10/01/2018 0823   AST 7 (L) 06/06/2014 1102   ALT 15 10/01/2018 0823   ALT 12 (L) 06/06/2014 1102   BILITOT 0.7 10/01/2018 0823   BILITOT 0.4 06/06/2014 1102       RADIOGRAPHIC STUDIES: I have personally reviewed the radiological images as listed and agreed with the findings in the report. No results found.  ASSESSMENT & PLAN:  Carcinoma of upper-inner quadrant of left breast in female, estrogen receptor positive ([place]) Left breast cancer- stage IV- ER/PR +, HER-2/neu - on Taxol chemotherapy; September 29, 2018 PET scan-stable bone lesions/left breast mass; decreasing effusions/left lung atelectasis; prominent right pelvic wall lymph node[see discussion below].  # HOLD chemo today- sec to PN/stable PET scan. We will resume  Taxol 105m/m2 at next visit.  Also discussed doing treatments every other week.    # Right pelvic wall lymph node; new -[on PET ]approximately centimeter in size; prominent uptake on PET scan.  Question etiology monitor for now.  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 See above.   # Bil LE swelling/ededma MILD- discussed re: compression stocking/ leg-stable.   # Chronic kidney disease - stage IV-STABLE.   # Type 2 DM- insulin dependent; STABLE.   # Anemia- hemoglobin today-9-10; STABLE.  on PO iron.   #  DISPOSITION:  # HOLD Treatment today; # follow up in 2 weeks/labs/MD-cbc/cmp-ca-27-29;-Taxol-Dr.B   No orders of the defined types were placed in this encounter.  All questions were answered. The patient knows to  call the clinic with any problems, questions or concerns.      GCammie Sickle MD 10/01/2018 5:32 PM

## 2018-10-02 LAB — CANCER ANTIGEN 27.29: CA 27.29: 63.4 U/mL — ABNORMAL HIGH (ref 0.0–38.6)

## 2018-10-13 DIAGNOSIS — E113293 Type 2 diabetes mellitus with mild nonproliferative diabetic retinopathy without macular edema, bilateral: Secondary | ICD-10-CM | POA: Diagnosis not present

## 2018-10-15 ENCOUNTER — Inpatient Hospital Stay (HOSPITAL_BASED_OUTPATIENT_CLINIC_OR_DEPARTMENT_OTHER): Payer: Medicare HMO | Admitting: Internal Medicine

## 2018-10-15 ENCOUNTER — Inpatient Hospital Stay: Payer: Medicare HMO

## 2018-10-15 ENCOUNTER — Inpatient Hospital Stay: Payer: Medicare HMO | Attending: Internal Medicine

## 2018-10-15 ENCOUNTER — Encounter: Payer: Self-pay | Admitting: Internal Medicine

## 2018-10-15 ENCOUNTER — Other Ambulatory Visit: Payer: Self-pay

## 2018-10-15 VITALS — BP 141/84 | HR 71 | Temp 96.9°F | Resp 18 | Wt 206.8 lb

## 2018-10-15 DIAGNOSIS — F329 Major depressive disorder, single episode, unspecified: Secondary | ICD-10-CM

## 2018-10-15 DIAGNOSIS — Z170 Estrogen receptor positive status [ER+]: Secondary | ICD-10-CM | POA: Diagnosis not present

## 2018-10-15 DIAGNOSIS — N184 Chronic kidney disease, stage 4 (severe): Secondary | ICD-10-CM | POA: Insufficient documentation

## 2018-10-15 DIAGNOSIS — D649 Anemia, unspecified: Secondary | ICD-10-CM | POA: Diagnosis not present

## 2018-10-15 DIAGNOSIS — Z7982 Long term (current) use of aspirin: Secondary | ICD-10-CM | POA: Diagnosis not present

## 2018-10-15 DIAGNOSIS — C50212 Malignant neoplasm of upper-inner quadrant of left female breast: Secondary | ICD-10-CM | POA: Insufficient documentation

## 2018-10-15 DIAGNOSIS — Z79899 Other long term (current) drug therapy: Secondary | ICD-10-CM | POA: Diagnosis not present

## 2018-10-15 DIAGNOSIS — Z7189 Other specified counseling: Secondary | ICD-10-CM

## 2018-10-15 DIAGNOSIS — Z95828 Presence of other vascular implants and grafts: Secondary | ICD-10-CM

## 2018-10-15 DIAGNOSIS — Z5111 Encounter for antineoplastic chemotherapy: Secondary | ICD-10-CM | POA: Diagnosis not present

## 2018-10-15 DIAGNOSIS — Z87891 Personal history of nicotine dependence: Secondary | ICD-10-CM | POA: Diagnosis not present

## 2018-10-15 DIAGNOSIS — Z794 Long term (current) use of insulin: Secondary | ICD-10-CM | POA: Diagnosis not present

## 2018-10-15 DIAGNOSIS — E119 Type 2 diabetes mellitus without complications: Secondary | ICD-10-CM | POA: Diagnosis not present

## 2018-10-15 LAB — COMPREHENSIVE METABOLIC PANEL
ALT: 16 U/L (ref 0–44)
AST: 16 U/L (ref 15–41)
Albumin: 3.9 g/dL (ref 3.5–5.0)
Alkaline Phosphatase: 78 U/L (ref 38–126)
Anion gap: 8 (ref 5–15)
BILIRUBIN TOTAL: 0.5 mg/dL (ref 0.3–1.2)
BUN: 35 mg/dL — ABNORMAL HIGH (ref 8–23)
CO2: 23 mmol/L (ref 22–32)
Calcium: 8.6 mg/dL — ABNORMAL LOW (ref 8.9–10.3)
Chloride: 104 mmol/L (ref 98–111)
Creatinine, Ser: 3.41 mg/dL — ABNORMAL HIGH (ref 0.44–1.00)
GFR calc Af Amer: 16 mL/min — ABNORMAL LOW (ref >60)
GFR calc non Af Amer: 14 mL/min — ABNORMAL LOW (ref >60)
Glucose, Bld: 88 mg/dL (ref 70–99)
POTASSIUM: 4.1 mmol/L (ref 3.5–5.1)
Sodium: 135 mmol/L (ref 135–145)
TOTAL PROTEIN: 7.6 g/dL (ref 6.5–8.1)

## 2018-10-15 LAB — CBC WITH DIFFERENTIAL/PLATELET
Abs Immature Granulocytes: 0.06 10*3/uL (ref 0.00–0.07)
Basophils Absolute: 0 10*3/uL (ref 0.0–0.1)
Basophils Relative: 0 %
EOS PCT: 2 %
Eosinophils Absolute: 0.1 10*3/uL (ref 0.0–0.5)
HCT: 28.9 % — ABNORMAL LOW (ref 36.0–46.0)
Hemoglobin: 9.7 g/dL — ABNORMAL LOW (ref 12.0–15.0)
Immature Granulocytes: 1 %
Lymphocytes Relative: 13 %
Lymphs Abs: 1.1 10*3/uL (ref 0.7–4.0)
MCH: 30.6 pg (ref 26.0–34.0)
MCHC: 33.6 g/dL (ref 30.0–36.0)
MCV: 91.2 fL (ref 80.0–100.0)
Monocytes Absolute: 0.8 10*3/uL (ref 0.1–1.0)
Monocytes Relative: 11 %
Neutro Abs: 5.9 10*3/uL (ref 1.7–7.7)
Neutrophils Relative %: 73 %
Platelets: 272 10*3/uL (ref 150–400)
RBC: 3.17 MIL/uL — ABNORMAL LOW (ref 3.87–5.11)
RDW: 13 % (ref 11.5–15.5)
WBC: 8 10*3/uL (ref 4.0–10.5)
nRBC: 0 % (ref 0.0–0.2)

## 2018-10-15 MED ORDER — SODIUM CHLORIDE 0.9% FLUSH
10.0000 mL | Freq: Once | INTRAVENOUS | Status: AC
  Administered 2018-10-15: 10 mL via INTRAVENOUS
  Filled 2018-10-15: qty 10

## 2018-10-15 MED ORDER — DIPHENHYDRAMINE HCL 50 MG/ML IJ SOLN
50.0000 mg | Freq: Once | INTRAMUSCULAR | Status: AC
  Administered 2018-10-15: 50 mg via INTRAVENOUS
  Filled 2018-10-15: qty 1

## 2018-10-15 MED ORDER — SODIUM CHLORIDE 0.9 % IV SOLN
Freq: Once | INTRAVENOUS | Status: AC
  Administered 2018-10-15: 10:00:00 via INTRAVENOUS
  Filled 2018-10-15: qty 250

## 2018-10-15 MED ORDER — DEXAMETHASONE SODIUM PHOSPHATE 10 MG/ML IJ SOLN
8.0000 mg | Freq: Once | INTRAMUSCULAR | Status: AC
  Administered 2018-10-15: 8 mg via INTRAVENOUS
  Filled 2018-10-15: qty 1

## 2018-10-15 MED ORDER — FAMOTIDINE IN NACL 20-0.9 MG/50ML-% IV SOLN
20.0000 mg | Freq: Once | INTRAVENOUS | Status: AC
  Administered 2018-10-15: 20 mg via INTRAVENOUS
  Filled 2018-10-15: qty 50

## 2018-10-15 MED ORDER — HEPARIN SOD (PORK) LOCK FLUSH 100 UNIT/ML IV SOLN
500.0000 [IU] | Freq: Once | INTRAVENOUS | Status: AC | PRN
  Administered 2018-10-15: 500 [IU]
  Filled 2018-10-15: qty 5

## 2018-10-15 MED ORDER — SODIUM CHLORIDE 0.9 % IV SOLN
60.0000 mg/m2 | Freq: Once | INTRAVENOUS | Status: AC
  Administered 2018-10-15: 114 mg via INTRAVENOUS
  Filled 2018-10-15: qty 19

## 2018-10-15 NOTE — Assessment & Plan Note (Addendum)
Left breast cancer- stage IV- ER/PR +, HER-2/neu - on Taxol chemotherapy; September 29, 2018 PET scan-stable bone lesions/left breast mass; decreasing effusions/left lung atelectasis; prominent right pelvic wall lymph node[see discussion below].  #Proceed with Taxol every 2 weeks. Labs today reviewed;  acceptable for treatment today.   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  Stable.  # Bil LE swelling/ededma MILD- discussed re: compression stocking/ leg-stable  # Chronic kidney disease - stage IV-stable  # Type 2 DM- insulin dependent; stable  # Anemia- hemoglobin today-9-10; STABLE.  on PO iron.   #  DISPOSITION:  # Treatment today # follow up in 2 weeks/labs/MD-cbc/cmp-ca-27-29;-Taxol-Dr.B

## 2018-10-15 NOTE — Progress Notes (Signed)
[place] OFFICE PROGRESS NOTE  Patient Care Team: [name], [name], [name]; [name]; [name]; [name]; EBUS- Positive Paratrac/subcarinal LN s/p ? Taxotere [in [place]; [name]; [name];[name]; [name]; suv 3]; [name]; [name]; [name];  [name]; Ant R iliac bone; [name]; [name]; START-Taxol weekly. March 2020- Taxol every 2 weeks [PN]  # Poorly controlled Blood sugars- improved.   # Pancreatitis Hx/PEI- on creon in past / CKD IV [creat ~ 3-4; [name]; [name]; mild left sided weakness]  # Jan 2020-  Lobular lesion on tongue- s/p excision pyogenic granuloma [Dr.McQueen]   # GENETIC TESTING/COUNSELLING: HETEROZYGOUS Cystic Fibrosis Gene [explains hx of recurrent pancreatitis]  # MOLECULAR TESTING: NA  ------------------------------------------------   DIAGNOSIS: [ 2015] BREAST CA; [name]; [name] ;GOALS: Palliative  CURRENT/MOST RECENT THERAPY [ April 2019] TAXOL      Carcinoma of upper-inner quadrant of left breast in female, estrogen receptor positive ([place])      INTERVAL HISTORY:  [name] 63 y.o.  female pleasant patient above history of ER PR positive HER-2 negative breast cancer on Taxol is here for  follow-up.  Patient continues to complain of tingling and numbness in extremities.  She finally started taking the Neurontin.  Did not notice any significant improvement yet.  Continues to have chronic swelling in the legs but not any worse.  No new shortness with a cough.  Review of Systems  Constitutional: Positive for malaise/fatigue. Negative for chills, diaphoresis, [name], hemoptysis, sputum production, shortness of breath and wheezing.   Cardiovascular: Positive for leg swelling. Negative for chest pain, palpitations and orthopnea.  Gastrointestinal: Negative for abdominal pain, blood in stool, constipation, diarrhea, heartburn, melena, nausea and vomiting.  Genitourinary: Negative for dysuria, frequency and urgency.  Musculoskeletal: Negative for back pain and joint pain.  Skin: Negative.  Negative for itching and rash.  Neurological: Positive for tingling. Negative for dizziness, focal weakness, weakness and headaches.  Endo/Heme/Allergies: Does not bruise/bleed easily.  Psychiatric/Behavioral: Negative for depression. The patient is not nervous/anxious and does not have insomnia.       PAST MEDICAL HISTORY :  Past Medical History:  Diagnosis Date  . Anemia   . Asthma   . Cancer ([place]) 03/10/2018   Per [facility] PET order. Carcinoma of upper-inner quadrant of left breast in female, estrogen receptor positive .  Marland Kitchen CHF (congestive heart failure) ([place]) 1997  . CKD (chronic kidney disease)   . Depression   . Diabetes mellitus, type 2 ([place])   . Family history of breast cancer   . Family history of colon cancer   . Family history of ovarian cancer   . Family history  of pancreatic cancer   . Family history of prostate cancer   . Family history of stomach cancer   . Hair loss   . History of left breast cancer 05/29/14  . History of partial hysterectomy  12/31/2016   Per patient.  Has not had a period in years.  Had a partial hysterectomy years ago.  Marland Kitchen Hypertension   . Obesity   . Pancreatitis 1997  . Stroke [facility]) 2010   with mild left arm weakness    PAST SURGICAL HISTORY :   Past Surgical History:  Procedure Laterality Date  . CESAREAN SECTION    . CHOLECYSTECTOMY    . EXCISION OF TONGUE LESION N/A 08/17/2018   Procedure: EXCISION OF TONGUE LESION WITH FROZEN SECTIONS;  Surgeon: Beverly Gust, MD;  Location: [facility] ORS;  Service: ENT;  Laterality: N/A;  . PARTIAL HYSTERECTOMY  12/31/2016   Per patient, she has not had a period in years since she had a partial hysterectomy.    FAMILY HISTORY :   Family History  Problem Relation Age of Onset  . Ovarian cancer Mother 53  . Diabetes Mother   . Hypertension Mother   . COPD Father   . Hypertension Father   . Colon cancer Father 47  . Diabetes Sister   . Breast cancer Sister 83       bilateral  . Diabetes Brother   . Leukemia Maternal Aunt   . Pancreatic cancer Paternal Aunt 34  . Pancreatic cancer Paternal Uncle   . Colon cancer Paternal Uncle   . Stomach cancer Maternal Grandfather 80  . Throat cancer Paternal Grandmother   . Breast cancer Maternal Aunt 80  . Colon cancer Maternal Aunt   . Bone cancer Maternal Aunt   . Breast cancer Paternal Aunt        dx >50  . Prostate cancer Paternal Uncle   . Pancreatic cancer Paternal Uncle   . Throat cancer Paternal Uncle   . Lung cancer Paternal Uncle   . Stomach cancer Paternal Uncle   . Brain cancer Paternal Aunt   . Cancer Cousin        liver, kidney  . Prostate cancer Cousin        meastatic  . Lung cancer Other     SOCIAL HISTORY:   Social History   Tobacco Use  . Smoking status: Former Smoker    Packs/day: 0.50    Years: 1.00    Pack years: 0.50    Types: Cigarettes  . Smokeless tobacco: Never Used  Substance Use Topics  . Alcohol use: No    Alcohol/week: 0.0 standard drinks  . Drug use: No     ALLERGIES:  has No Known Allergies.  MEDICATIONS:  Current Outpatient Medications  Medication Sig Dispense Refill  . acetaminophen-codeine (TYLENOL #4) 300-60 MG tablet Take 2 tablets by mouth every 6 (six) hours as needed for moderate pain.     Marland Kitchen albuterol (PROAIR HFA) 108 (90 BASE) MCG/ACT inhaler Inhale 2 puffs into the lungs every 6 (six) hours as needed for wheezing or shortness of breath.     Marland Kitchen albuterol (PROVENTIL) (2.5 MG/3ML) 0.083% nebulizer solution Inhale 2.5 mg into the lungs every 6 (six) hours as needed for shortness of breath.     . ALPRAZolam (XANAX) 0.5 MG tablet Take 0.5 mg by mouth at bedtime as needed for anxiety or sleep.     Marland Kitchen amLODipine (NORVASC) 10 MG tablet Take 10 mg by mouth daily.     Marland Kitchen  aspirin EC 81 MG tablet Take 81 mg by mouth daily.     Marland Kitchen atenolol (TENORMIN) 100 MG tablet Take 100 mg by mouth 2 (two) times daily.     . B-D ULTRA-FINE 33 LANCETS MISC Use 1 each 2 (two) times daily.    . B-D ULTRAFINE III SHORT PEN 31G X 8 MM MISC     . bumetanide (BUMEX) 0.5 MG tablet Take 0.5 mg by mouth 2 (two) times daily.     . calcitRIOL (ROCALTROL) 0.25 MCG capsule Take 0.25 mcg by mouth every Monday, Wednesday, and Friday.     . Cinnamon 500 MG capsule Take 1,000 mg by mouth daily.     . cloNIDine (CATAPRES) 0.2 MG tablet Take 0.2 mg by mouth 2 (two) times daily.     . enalapril (VASOTEC) 20 MG tablet Take 20 mg by mouth 2 (two) times daily.     . famotidine (PEPCID) 20 MG tablet Take 20 mg by mouth 2 (two) times daily.    . ferrous sulfate 325 (65 FE) MG tablet Take 325 mg by mouth 2 (two) times daily with a meal.    . FLUoxetine (PROZAC) 20 MG capsule Take 20 mg by mouth 2 (two) times daily.     . Fluticasone Propionate, Inhal, (FLOVENT DISKUS) 100 MCG/BLIST AEPB Inhale into the lungs. Inhale 2 inhalations into the lungs 2 times daily PRN    . gabapentin (NEURONTIN) 100 MG capsule Take 1 capsule (100 mg total) by mouth at bedtime. 30 capsule 2  . glucose blood  (ONE TOUCH ULTRA TEST) test strip Use 1 each 2 (two) times daily. Use as instructed.    . glyBURIDE (DIABETA) 5 MG tablet Take 10 mg by mouth 2 (two) times daily with a meal.     . LEVEMIR FLEXTOUCH 100 UNIT/ML Pen Inject 55 Units into the skin daily.     Marland Kitchen loperamide (IMODIUM A-D) 2 MG capsule Take 2 mg by mouth as needed for diarrhea or loose stools.    . mometasone (NASONEX) 50 MCG/ACT nasal spray Place 2 sprays into the nose daily as needed (Allergies).     . NOVOLOG FLEXPEN 100 UNIT/ML FlexPen Inject 7 Units into the skin 2 (two) times daily.     . ondansetron (ZOFRAN) 8 MG tablet One pill every 8 hours as needed for nausea/vomitting. 40 tablet 1  . prochlorperazine (COMPAZINE) 10 MG tablet Take 1 tablet (10 mg total) by mouth every 6 (six) hours as needed for nausea or vomiting. Please note change in strength 60 tablet 4  . salmeterol (SEREVENT) 50 MCG/DOSE diskus inhaler Inhale 1 puff into the lungs daily as needed (shortness of breath).     . simvastatin (ZOCOR) 20 MG tablet Take 20 mg by mouth daily at 6 PM.     . vitamin B-12 (CYANOCOBALAMIN) 1000 MCG tablet Take 1,000 mcg by mouth daily.     No current facility-administered medications for this visit.    Facility-Administered Medications Ordered in Other Visits  Medication Dose Route Frequency Provider Last Rate Last Dose  . sodium chloride flush (NS) 0.9 % injection 10 mL  10 mL Intravenous PRN Cammie Sickle, MD   10 mL at 01/30/16 1054    PHYSICAL EXAMINATION: ECOG PERFORMANCE STATUS: 1 - Symptomatic but completely ambulatory  BP (!) 141/84 (BP Location: Right Arm)   Pulse 71   Temp (!) 96.9 F (36.1 C) (Tympanic)   Resp 18   Wt 206 lb 12.8 oz (93.8 kg)  BMI 37.82 kg/m   Filed Weights   10/15/18 0849  Weight: 206 lb 12.8 oz (93.8 kg)    Physical Exam  Constitutional: She is oriented to person, place, and time and well-developed, well-nourished, and in no distress.  She is alone.  HENT:  Head:  Normocephalic and atraumatic.  Mouth/Throat: Oropharynx is clear and moist. No oropharyngeal exudate.  Eyes: Pupils are equal, round, and reactive to light.  Neck: Normal range of motion. Neck supple.  Cardiovascular: Normal rate and regular rhythm.  Pulmonary/Chest: Breath sounds normal. No respiratory distress. She has no wheezes.  Abdominal: Soft. Bowel sounds are normal. She exhibits no distension and no mass. There is no abdominal tenderness. There is no rebound and no guarding.  Musculoskeletal: Normal range of motion.        General: No tenderness.  Neurological: She is alert and oriented to person, place, and time.  Skin: Skin is warm.  Psychiatric: Affect normal.       LABORATORY DATA:  I have reviewed the data as listed    Component Value Date/Time   NA 135 10/15/2018 0752   NA 130 (L) 06/06/2014 1102   K 4.1 10/15/2018 0752   K 3.9 06/06/2014 1102   CL 104 10/15/2018 0752   CL 95 (L) 06/06/2014 1102   CO2 23 10/15/2018 0752   CO2 28 06/06/2014 1102   GLUCOSE 88 10/15/2018 0752   GLUCOSE 349 (H) 06/06/2014 1102   BUN 35 (H) 10/15/2018 0752   BUN 17 06/06/2014 1102   CREATININE 3.41 (H) 10/15/2018 0752   CREATININE 1.63 (H) 06/06/2014 1102   CALCIUM 8.6 (L) 10/15/2018 0752   CALCIUM 9.2 06/06/2014 1102   PROT 7.6 10/15/2018 0752   PROT 8.2 06/06/2014 1102   ALBUMIN 3.9 10/15/2018 0752   ALBUMIN 3.3 (L) 06/06/2014 1102   AST 16 10/15/2018 0752   AST 7 (L) 06/06/2014 1102   ALT 16 10/15/2018 0752   ALT 12 (L) 06/06/2014 1102   ALKPHOS 78 10/15/2018 0752   ALKPHOS 74 06/06/2014 1102   BILITOT 0.5 10/15/2018 0752   BILITOT 0.4 06/06/2014 1102   GFRNONAA 14 (L) 10/15/2018 0752   GFRNONAA 35 (L) 06/06/2014 1102   GFRAA 16 (L) 10/15/2018 0752   GFRAA 42 (L) 06/06/2014 1102    No results found for: SPEP, UPEP  Lab Results  Component Value Date   WBC 8.0 10/15/2018   NEUTROABS 5.9 10/15/2018   HGB 9.7 (L) 10/15/2018   HCT 28.9 (L) 10/15/2018   MCV 91.2  10/15/2018   PLT 272 10/15/2018      Chemistry      Component Value Date/Time   NA 135 10/15/2018 0752   NA 130 (L) 06/06/2014 1102   K 4.1 10/15/2018 0752   K 3.9 06/06/2014 1102   CL 104 10/15/2018 0752   CL 95 (L) 06/06/2014 1102   CO2 23 10/15/2018 0752   CO2 28 06/06/2014 1102   BUN 35 (H) 10/15/2018 0752   BUN 17 06/06/2014 1102   CREATININE 3.41 (H) 10/15/2018 0752   CREATININE 1.63 (H) 06/06/2014 1102      Component Value Date/Time   CALCIUM 8.6 (L) 10/15/2018 0752   CALCIUM 9.2 06/06/2014 1102   ALKPHOS 78 10/15/2018 0752   ALKPHOS 74 06/06/2014 1102   AST 16 10/15/2018 0752   AST 7 (L) 06/06/2014 1102   ALT 16 10/15/2018 0752   ALT 12 (L) 06/06/2014 1102   BILITOT 0.5 10/15/2018 0752   BILITOT 0.4  06/06/2014 1102       RADIOGRAPHIC STUDIES: I have personally reviewed the radiological images as listed and agreed with the findings in the report. No results found.   ASSESSMENT & PLAN:  Carcinoma of upper-inner quadrant of left breast in female, estrogen receptor positive ([place]) Left breast cancer- stage IV- ER/PR +, HER-2/neu - on Taxol chemotherapy; September 29, 2018 PET scan-stable bone lesions/left breast mass; decreasing effusions/left lung atelectasis; prominent right pelvic wall lymph node[see discussion below].  #Proceed with Taxol every 2 weeks. Labs today reviewed;  acceptable for treatment today.   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  Stable.  # Bil LE swelling/ededma MILD- discussed re: compression stocking/ leg-stable  # Chronic kidney disease - stage IV-stable  # Type 2 DM- insulin dependent; stable  # Anemia- hemoglobin today-9-10; STABLE.  on PO iron.   #  DISPOSITION:  # Treatment today # follow up in 2 weeks/labs/MD-cbc/cmp-ca-27-29;-Taxol-Dr.B   No orders of the defined types were placed in this encounter.  All questions were answered. The patient knows to call the clinic with any problems, questions or  concerns.      Cammie Sickle, MD 10/16/2018 10:40 AM

## 2018-10-15 NOTE — Progress Notes (Signed)
Patient here for follow up. Pt went to eye Dr last week and she states they are going to do retinal and cataract surgery next month.

## 2018-10-16 LAB — CANCER ANTIGEN 27.29: CAN 27.29: 64.8 U/mL — AB (ref 0.0–38.6)

## 2018-10-18 DIAGNOSIS — N2581 Secondary hyperparathyroidism of renal origin: Secondary | ICD-10-CM | POA: Diagnosis not present

## 2018-10-18 DIAGNOSIS — D631 Anemia in chronic kidney disease: Secondary | ICD-10-CM | POA: Diagnosis not present

## 2018-10-18 DIAGNOSIS — N185 Chronic kidney disease, stage 5: Secondary | ICD-10-CM | POA: Diagnosis not present

## 2018-10-18 DIAGNOSIS — I120 Hypertensive chronic kidney disease with stage 5 chronic kidney disease or end stage renal disease: Secondary | ICD-10-CM | POA: Diagnosis not present

## 2018-10-18 DIAGNOSIS — E1122 Type 2 diabetes mellitus with diabetic chronic kidney disease: Secondary | ICD-10-CM | POA: Diagnosis not present

## 2018-10-28 ENCOUNTER — Other Ambulatory Visit: Payer: Self-pay

## 2018-10-29 ENCOUNTER — Encounter: Payer: Self-pay | Admitting: Internal Medicine

## 2018-10-29 ENCOUNTER — Inpatient Hospital Stay: Payer: Medicare HMO

## 2018-10-29 ENCOUNTER — Other Ambulatory Visit: Payer: Self-pay

## 2018-10-29 ENCOUNTER — Inpatient Hospital Stay (HOSPITAL_BASED_OUTPATIENT_CLINIC_OR_DEPARTMENT_OTHER): Payer: Medicare HMO | Admitting: Internal Medicine

## 2018-10-29 VITALS — BP 123/73 | HR 69 | Resp 18

## 2018-10-29 VITALS — BP 134/79 | HR 77 | Temp 97.1°F | Resp 16 | Wt 207.2 lb

## 2018-10-29 DIAGNOSIS — F329 Major depressive disorder, single episode, unspecified: Secondary | ICD-10-CM

## 2018-10-29 DIAGNOSIS — Z170 Estrogen receptor positive status [ER+]: Secondary | ICD-10-CM

## 2018-10-29 DIAGNOSIS — Z7982 Long term (current) use of aspirin: Secondary | ICD-10-CM

## 2018-10-29 DIAGNOSIS — D649 Anemia, unspecified: Secondary | ICD-10-CM

## 2018-10-29 DIAGNOSIS — Z87891 Personal history of nicotine dependence: Secondary | ICD-10-CM

## 2018-10-29 DIAGNOSIS — E119 Type 2 diabetes mellitus without complications: Secondary | ICD-10-CM

## 2018-10-29 DIAGNOSIS — N184 Chronic kidney disease, stage 4 (severe): Secondary | ICD-10-CM | POA: Diagnosis not present

## 2018-10-29 DIAGNOSIS — Z79899 Other long term (current) drug therapy: Secondary | ICD-10-CM

## 2018-10-29 DIAGNOSIS — Z7189 Other specified counseling: Secondary | ICD-10-CM

## 2018-10-29 DIAGNOSIS — Z5111 Encounter for antineoplastic chemotherapy: Secondary | ICD-10-CM | POA: Diagnosis not present

## 2018-10-29 DIAGNOSIS — Z794 Long term (current) use of insulin: Secondary | ICD-10-CM | POA: Diagnosis not present

## 2018-10-29 DIAGNOSIS — C50212 Malignant neoplasm of upper-inner quadrant of left female breast: Secondary | ICD-10-CM | POA: Diagnosis not present

## 2018-10-29 LAB — CBC WITH DIFFERENTIAL/PLATELET
Abs Immature Granulocytes: 0.2 10*3/uL — ABNORMAL HIGH (ref 0.00–0.07)
Basophils Absolute: 0.1 10*3/uL (ref 0.0–0.1)
Basophils Relative: 1 %
EOS ABS: 0.3 10*3/uL (ref 0.0–0.5)
Eosinophils Relative: 3 %
HCT: 30.3 % — ABNORMAL LOW (ref 36.0–46.0)
Hemoglobin: 10.4 g/dL — ABNORMAL LOW (ref 12.0–15.0)
IMMATURE GRANULOCYTES: 2 %
Lymphocytes Relative: 23 %
Lymphs Abs: 2.3 10*3/uL (ref 0.7–4.0)
MCH: 31 pg (ref 26.0–34.0)
MCHC: 34.3 g/dL (ref 30.0–36.0)
MCV: 90.2 fL (ref 80.0–100.0)
Monocytes Absolute: 1.1 10*3/uL — ABNORMAL HIGH (ref 0.1–1.0)
Monocytes Relative: 11 %
Neutro Abs: 6.1 10*3/uL (ref 1.7–7.7)
Neutrophils Relative %: 60 %
PLATELETS: 342 10*3/uL (ref 150–400)
RBC: 3.36 MIL/uL — ABNORMAL LOW (ref 3.87–5.11)
RDW: 12.7 % (ref 11.5–15.5)
WBC: 10 10*3/uL (ref 4.0–10.5)
nRBC: 0 % (ref 0.0–0.2)

## 2018-10-29 LAB — COMPREHENSIVE METABOLIC PANEL
ALT: 17 U/L (ref 0–44)
AST: 18 U/L (ref 15–41)
Albumin: 4 g/dL (ref 3.5–5.0)
Alkaline Phosphatase: 82 U/L (ref 38–126)
Anion gap: 10 (ref 5–15)
BUN: 36 mg/dL — ABNORMAL HIGH (ref 8–23)
CO2: 22 mmol/L (ref 22–32)
Calcium: 9 mg/dL (ref 8.9–10.3)
Chloride: 104 mmol/L (ref 98–111)
Creatinine, Ser: 3.36 mg/dL — ABNORMAL HIGH (ref 0.44–1.00)
GFR calc Af Amer: 16 mL/min — ABNORMAL LOW (ref >60)
GFR calc non Af Amer: 14 mL/min — ABNORMAL LOW (ref >60)
Glucose, Bld: 73 mg/dL (ref 70–99)
Potassium: 3.9 mmol/L (ref 3.5–5.1)
Sodium: 136 mmol/L (ref 135–145)
Total Bilirubin: 0.6 mg/dL (ref 0.3–1.2)
Total Protein: 7.9 g/dL (ref 6.5–8.1)

## 2018-10-29 MED ORDER — SODIUM CHLORIDE 0.9 % IV SOLN
60.0000 mg/m2 | Freq: Once | INTRAVENOUS | Status: AC
  Administered 2018-10-29: 114 mg via INTRAVENOUS
  Filled 2018-10-29: qty 19

## 2018-10-29 MED ORDER — HEPARIN SOD (PORK) LOCK FLUSH 100 UNIT/ML IV SOLN
500.0000 [IU] | Freq: Once | INTRAVENOUS | Status: AC | PRN
  Administered 2018-10-29: 500 [IU]
  Filled 2018-10-29: qty 5

## 2018-10-29 MED ORDER — FAMOTIDINE IN NACL 20-0.9 MG/50ML-% IV SOLN
20.0000 mg | Freq: Once | INTRAVENOUS | Status: AC
  Administered 2018-10-29: 20 mg via INTRAVENOUS

## 2018-10-29 MED ORDER — SODIUM CHLORIDE 0.9 % IV SOLN
Freq: Once | INTRAVENOUS | Status: AC
  Filled 2018-10-29: qty 100

## 2018-10-29 MED ORDER — DIPHENHYDRAMINE HCL 50 MG/ML IJ SOLN
50.0000 mg | Freq: Once | INTRAMUSCULAR | Status: AC
  Administered 2018-10-29: 50 mg via INTRAVENOUS
  Filled 2018-10-29: qty 1

## 2018-10-29 MED ORDER — DEXAMETHASONE SODIUM PHOSPHATE 10 MG/ML IJ SOLN
8.0000 mg | Freq: Once | INTRAMUSCULAR | Status: AC
  Administered 2018-10-29: 8 mg via INTRAVENOUS
  Filled 2018-10-29: qty 1

## 2018-10-29 MED ORDER — SODIUM CHLORIDE 0.9 % IV SOLN
Freq: Once | INTRAVENOUS | Status: AC
  Administered 2018-10-29: 09:00:00 via INTRAVENOUS
  Filled 2018-10-29: qty 250

## 2018-10-29 NOTE — Progress Notes (Signed)
[facility] OFF[facility] PROGRESS NOTE  Patient Care Team: [name], [name], [name]; [name]; [name]; [name]; EBUS- Positive Paratrac/subcarinal LN s/p ? Taxotere [in [place]; [name]; [name];[name]; [name]; suv 3]; [name]; [name]; [name];  [name]; Ant R iliac bone; [name]; [name]; START-Taxol weekly. March 2020- Taxol every 2 weeks [PN]  # Poorly controlled Blood sugars- improved.   # Pancreatitis Hx/PEI- on creon in past / CKD IV [creat ~ 3-4; [name]; [name]; mild left sided weakness]  # Jan 2020-  Lobular lesion on tongue- s/p excision pyogenic granuloma [Dr.McQueen]   # GENETIC TESTING/COUNSELLING: HETEROZYGOUS Cystic Fibrosis Gene [explains hx of recurrent pancreatitis]  # MOLECULAR TESTING: NA  ------------------------------------------------   DIAGNOSIS: [ 2015] BREAST CA; [name]; [name] ;GOALS: Palliative  CURRENT/MOST RECENT THERAPY [ April 2019] TAXOL      Carcinoma of upper-inner quadrant of left breast in female, estrogen receptor positive ([place])      INTERVAL HISTORY:  [name] 63 y.o.  female pleasant patient above history of ER PR positive HER-2 negative breast cancer on Taxol is here for  follow-up.  Patient continues to complain of mild tingling and numbness in the extremities.  However notes improvement in the neuropathy since starting Neurontin.  No nausea no vomiting.  No new cough or fevers or chills.  Review of Systems  Constitutional: Positive for malaise/fatigue. Negative for chills, diaphoresis, [name], hemoptysis, sputum production, shortness of breath and wheezing.   Cardiovascular: Positive for leg swelling. Negative for chest pain, palpitations and orthopnea.  Gastrointestinal: Negative for abdominal pain, blood in stool, constipation, diarrhea, heartburn, melena, nausea and vomiting.  Genitourinary: Negative for dysuria, frequency and urgency.  Musculoskeletal: Negative for back pain and joint pain.  Skin: Negative.  Negative for itching and rash.  Neurological: Positive for tingling. Negative for dizziness, focal weakness, weakness and headaches.  Endo/Heme/Allergies: Does not bruise/bleed easily.  Psychiatric/Behavioral: Negative for depression. The patient is not nervous/anxious and does not have insomnia.       PAST MEDICAL HISTORY :  Past Medical History:  Diagnosis Date  . Anemia   . Asthma   . Cancer ([place]) 03/10/2018   Per [facility] PET order. Carcinoma of upper-inner quadrant of left breast in female, estrogen receptor positive .  Marland Kitchen CHF (congestive heart failure) ([place]) 1997  . CKD (chronic kidney disease)   . Depression   . Diabetes mellitus, type 2 ([place])   . Family history of breast cancer   . Family history of colon cancer   . Family history of ovarian cancer   . Family history of pancreatic cancer   . Family history of prostate  cancer   . Family history of stomach cancer   . Hair loss   . History of left breast cancer 05/29/14  . History of partial hysterectomy 12/31/2016   Per patient.  Has not had a period in  years.  Had a partial hysterectomy years ago.  Marland Kitchen Hypertension   . Obesity   . Pancreatitis 1997  . Stroke [facility]) 2010   with mild left arm weakness    PAST SURGICAL HISTORY :   Past Surgical History:  Procedure Laterality Date  . CESAREAN SECTION    . CHOLECYSTECTOMY    . EXCISION OF TONGUE LESION N/A 08/17/2018   Procedure: EXCISION OF TONGUE LESION WITH FROZEN SECTIONS;  Surgeon: Beverly Gust, MD;  Location: [facility] ORS;  Service: ENT;  Laterality: N/A;  . PARTIAL HYSTERECTOMY  12/31/2016   Per patient, she has not had a period in years since she had a partial hysterectomy.    FAMILY HISTORY :   Family History  Problem Relation Age of Onset  . Ovarian cancer Mother 71  . Diabetes Mother   . Hypertension Mother   . COPD Father   . Hypertension Father   . Colon cancer Father 74  . Diabetes Sister   . Breast cancer Sister 68       bilateral  . Diabetes Brother   . Leukemia Maternal Aunt   . Pancreatic cancer Paternal Aunt 54  . Pancreatic cancer Paternal Uncle   . Colon cancer Paternal Uncle   . Stomach cancer Maternal Grandfather 35  . Throat cancer Paternal Grandmother   . Breast cancer Maternal Aunt 80  . Colon cancer Maternal Aunt   . Bone cancer Maternal Aunt   . Breast cancer Paternal Aunt        dx >50  . Prostate cancer Paternal Uncle   . Pancreatic cancer Paternal Uncle   . Throat cancer Paternal Uncle   . Lung cancer Paternal Uncle   . Stomach cancer Paternal Uncle   . Brain cancer Paternal Aunt   . Cancer Cousin        liver, kidney  . Prostate cancer Cousin        meastatic  . Lung cancer Other     SOCIAL HISTORY:   Social History   Tobacco Use  . Smoking status: Former Smoker    Packs/day: 0.50    Years: 1.00    Pack years: 0.50    Types: Cigarettes  . Smokeless tobacco: Never Used  Substance Use Topics  . Alcohol use: No    Alcohol/week: 0.0 standard drinks  . Drug use: No    ALLERGIES:  has No Known Allergies.  MEDICATIONS:   Current Outpatient Medications  Medication Sig Dispense Refill  . acetaminophen-codeine (TYLENOL #4) 300-60 MG tablet Take 2 tablets by mouth every 6 (six) hours as needed for moderate pain.     Marland Kitchen albuterol (PROAIR HFA) 108 (90 BASE) MCG/ACT inhaler Inhale 2 puffs into the lungs every 6 (six) hours as needed for wheezing or shortness of breath.     Marland Kitchen albuterol (PROVENTIL) (2.5 MG/3ML) 0.083% nebulizer solution Inhale 2.5 mg into the lungs every 6 (six) hours as needed for shortness of breath.     . ALPRAZolam (XANAX) 0.5 MG tablet Take 0.5 mg by mouth at bedtime as needed for anxiety or sleep.     Marland Kitchen amLODipine (NORVASC) 10 MG tablet Take 10 mg by mouth daily.     Marland Kitchen aspirin EC 81 MG tablet Take 81  mg by mouth daily.     Marland Kitchen atenolol (TENORMIN) 100 MG tablet Take 100 mg by mouth 2 (two) times daily.     . B-D ULTRA-FINE 33 LANCETS MISC Use 1 each 2 (two) times daily.    . B-D ULTRAFINE III SHORT PEN 31G X 8 MM MISC     . bumetanide (BUMEX) 0.5 MG tablet Take 0.5 mg by mouth 2 (two) times daily.     . calcitRIOL (ROCALTROL) 0.25 MCG capsule Take 0.25 mcg by mouth every Monday, Wednesday, and Friday.     . Cinnamon 500 MG capsule Take 1,000 mg by mouth daily.     . cloNIDine (CATAPRES) 0.2 MG tablet Take 0.2 mg by mouth 2 (two) times daily.     . enalapril (VASOTEC) 20 MG tablet Take 20 mg by mouth 2 (two) times daily.     . famotidine (PEPCID) 20 MG tablet Take 20 mg by mouth 2 (two) times daily.    . ferrous sulfate 325 (65 FE) MG tablet Take 325 mg by mouth 2 (two) times daily with a meal.    . FLUoxetine (PROZAC) 20 MG capsule Take 20 mg by mouth 2 (two) times daily.     . Fluticasone Propionate, Inhal, (FLOVENT DISKUS) 100 MCG/BLIST AEPB Inhale into the lungs. Inhale 2 inhalations into the lungs 2 times daily PRN    . gabapentin (NEURONTIN) 100 MG capsule Take 1 capsule (100 mg total) by mouth at bedtime. 30 capsule 2  . glucose blood (ONE TOUCH ULTRA TEST) test strip Use 1 each 2 (two) times  daily. Use as instructed.    . glyBURIDE (DIABETA) 5 MG tablet Take 10 mg by mouth 2 (two) times daily with a meal.     . LEVEMIR FLEXTOUCH 100 UNIT/ML Pen Inject 55 Units into the skin daily.     Marland Kitchen loperamide (IMODIUM A-D) 2 MG capsule Take 2 mg by mouth as needed for diarrhea or loose stools.    . mometasone (NASONEX) 50 MCG/ACT nasal spray Place 2 sprays into the nose daily as needed (Allergies).     . NOVOLOG FLEXPEN 100 UNIT/ML FlexPen Inject 7 Units into the skin 2 (two) times daily.     . ondansetron (ZOFRAN) 8 MG tablet One pill every 8 hours as needed for nausea/vomitting. 40 tablet 1  . prochlorperazine (COMPAZINE) 10 MG tablet Take 1 tablet (10 mg total) by mouth every 6 (six) hours as needed for nausea or vomiting. Please note change in strength 60 tablet 4  . salmeterol (SEREVENT) 50 MCG/DOSE diskus inhaler Inhale 1 puff into the lungs daily as needed (shortness of breath).     . simvastatin (ZOCOR) 20 MG tablet Take 20 mg by mouth daily at 6 PM.     . vitamin B-12 (CYANOCOBALAMIN) 1000 MCG tablet Take 1,000 mcg by mouth daily.     No current facility-administered medications for this visit.    Facility-Administered Medications Ordered in Other Visits  Medication Dose Route Frequency Provider Last Rate Last Dose  . dexamethasone (DECADRON) injection 8 mg  8 mg Intravenous Once Charlaine Dalton R, MD      . diphenhydrAMINE (BENADRYL) injection 50 mg  50 mg Intravenous Once Charlaine Dalton R, MD      . famotidine (PEPCID) IVPB 20 mg premix  20 mg Intravenous Once Charlaine Dalton R, MD      . heparin lock flush 100 unit/mL  500 Units Intracatheter Once PRN Cammie Sickle, MD      .  PACLitaxel (TAXOL) 114 mg in sodium chloride 0.9 % 250 mL chemo infusion (</= 48m/m2)  60 mg/m2 (Treatment Plan Recorded) Intravenous Once BCharlaine DaltonR, MD      . sodium chloride flush (NS) 0.9 % injection 10 mL  10 mL Intravenous PRN BCammie Sickle MD   10 mL at 01/30/16  1054    PHYSICAL EXAMINATION: ECOG PERFORMANCE STATUS: 1 - Symptomatic but completely ambulatory  BP 134/79 (BP Location: Left Arm, Patient Position: Sitting, Cuff Size: Normal)   Pulse 77   Temp (!) 97.1 F (36.2 C) (Tympanic)   Resp 16   Wt 207 lb 3.2 oz (94 kg)   BMI 37.90 kg/m   Filed Weights   10/29/18 0842  Weight: 207 lb 3.2 oz (94 kg)    Physical Exam  Constitutional: She is oriented to person, place, and time and well-developed, well-nourished, and in no distress.  She is alone.  HENT:  Head: Normocephalic and atraumatic.  Mouth/Throat: Oropharynx is clear and moist. No oropharyngeal exudate.  Eyes: Pupils are equal, round, and reactive to light.  Neck: Normal range of motion. Neck supple.  Cardiovascular: Normal rate and regular rhythm.  Pulmonary/Chest: Breath sounds normal. No respiratory distress. She has no wheezes.  Abdominal: Soft. Bowel sounds are normal. She exhibits no distension and no mass. There is no abdominal tenderness. There is no rebound and no guarding.  Musculoskeletal: Normal range of motion.        General: No tenderness.  Neurological: She is alert and oriented to person, place, and time.  Skin: Skin is warm.  Psychiatric: Affect normal.       LABORATORY DATA:  I have reviewed the data as listed    Component Value Date/Time   NA 136 10/29/2018 0811   NA 130 (L) 06/06/2014 1102   K 3.9 10/29/2018 0811   K 3.9 06/06/2014 1102   CL 104 10/29/2018 0811   CL 95 (L) 06/06/2014 1102   CO2 22 10/29/2018 0811   CO2 28 06/06/2014 1102   GLUCOSE 73 10/29/2018 0811   GLUCOSE 349 (H) 06/06/2014 1102   BUN 36 (H) 10/29/2018 0811   BUN 17 06/06/2014 1102   CREATININE 3.36 (H) 10/29/2018 0811   CREATININE 1.63 (H) 06/06/2014 1102   CALCIUM 9.0 10/29/2018 0811   CALCIUM 9.2 06/06/2014 1102   PROT 7.9 10/29/2018 0811   PROT 8.2 06/06/2014 1102   ALBUMIN 4.0 10/29/2018 0811   ALBUMIN 3.3 (L) 06/06/2014 1102   AST 18 10/29/2018 0811   AST  7 (L) 06/06/2014 1102   ALT 17 10/29/2018 0811   ALT 12 (L) 06/06/2014 1102   ALKPHOS 82 10/29/2018 0811   ALKPHOS 74 06/06/2014 1102   BILITOT 0.6 10/29/2018 0811   BILITOT 0.4 06/06/2014 1102   GFRNONAA 14 (L) 10/29/2018 0811   GFRNONAA 35 (L) 06/06/2014 1102   GFRAA 16 (L) 10/29/2018 0811   GFRAA 42 (L) 06/06/2014 1102    No results found for: SPEP, UPEP  Lab Results  Component Value Date   WBC 10.0 10/29/2018   NEUTROABS 6.1 10/29/2018   HGB 10.4 (L) 10/29/2018   HCT 30.3 (L) 10/29/2018   MCV 90.2 10/29/2018   PLT 342 10/29/2018      Chemistry      Component Value Date/Time   NA 136 10/29/2018 0811   NA 130 (L) 06/06/2014 1102   K 3.9 10/29/2018 0811   K 3.9 06/06/2014 1102   CL 104 10/29/2018 0811   CL  95 (L) 06/06/2014 1102   CO2 22 10/29/2018 0811   CO2 28 06/06/2014 1102   BUN 36 (H) 10/29/2018 0811   BUN 17 06/06/2014 1102   CREATININE 3.36 (H) 10/29/2018 0811   CREATININE 1.63 (H) 06/06/2014 1102      Component Value Date/Time   CALCIUM 9.0 10/29/2018 0811   CALCIUM 9.2 06/06/2014 1102   ALKPHOS 82 10/29/2018 0811   ALKPHOS 74 06/06/2014 1102   AST 18 10/29/2018 0811   AST 7 (L) 06/06/2014 1102   ALT 17 10/29/2018 0811   ALT 12 (L) 06/06/2014 1102   BILITOT 0.6 10/29/2018 0811   BILITOT 0.4 06/06/2014 1102       RADIOGRAPHIC STUDIES: I have personally reviewed the radiological images as listed and agreed with the findings in the report. No results found.   ASSESSMENT & PLAN:  Carcinoma of upper-inner quadrant of left breast in female, estrogen receptor positive ([place]) Left breast cancer- stage IV- ER/PR +, HER-2/neu - on Taxol chemotherapy; September 29, 2018 PET scan-stable bone lesions/left breast mass; decreasing effusions/left lung atelectasis; prominent right pelvic wall lymph node[see discussion below]. slow rise in Tumor markers; clinically stable.  #Proceed with Taxol every 2 weeks. Labs today reviewed;  acceptable for treatment today.    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   # Bil LE swelling/ededma MILD- discussed re: compression stocking/ leg- stable.   # Chronic kidney disease - stage IV-stable.  # Anemia- hemoglobin today-9-10; stable..  on PO iron.   # Educated the patient regarding novel coronavirus-modes of transmission/risks; and measures to avoid infection.   #  DISPOSITION:  # Treatment today # follow up in 2 weeks/labs/MD-cbc/cmp-ca-27-29;-Taxol-Dr.B   No orders of the defined types were placed in this encounter.  All questions were answered. The patient knows to call the clinic with any problems, questions or concerns.      Cammie Sickle, MD 10/29/2018 9:30 AM

## 2018-10-29 NOTE — Assessment & Plan Note (Signed)
Left breast cancer- stage IV- ER/PR +, HER-2/neu - on Taxol chemotherapy; September 29, 2018 PET scan-stable bone lesions/left breast mass; decreasing effusions/left lung atelectasis; prominent right pelvic wall lymph node[see discussion below]. slow rise in Tumor markers; clinically stable.  #Proceed with Taxol every 2 weeks. Labs today reviewed;  acceptable for treatment today.   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   # Bil LE swelling/ededma MILD- discussed re: compression stocking/ leg- stable.   # Chronic kidney disease - stage IV-stable.  # Anemia- hemoglobin today-9-10; stable..  on PO iron.   # Educated the patient regarding novel coronavirus-modes of transmission/risks; and measures to avoid infection.   #  DISPOSITION:  # Treatment today # follow up in 2 weeks/labs/MD-cbc/cmp-ca-27-29;-Taxol-Dr.B

## 2018-10-30 LAB — CANCER ANTIGEN 27.29: CA 27.29: 76.4 U/mL — ABNORMAL HIGH (ref 0.0–38.6)

## 2018-11-11 ENCOUNTER — Other Ambulatory Visit: Payer: Self-pay

## 2018-11-12 ENCOUNTER — Inpatient Hospital Stay (HOSPITAL_BASED_OUTPATIENT_CLINIC_OR_DEPARTMENT_OTHER): Payer: Medicare HMO | Admitting: Internal Medicine

## 2018-11-12 ENCOUNTER — Other Ambulatory Visit: Payer: Self-pay

## 2018-11-12 ENCOUNTER — Inpatient Hospital Stay: Payer: Medicare HMO | Attending: Internal Medicine

## 2018-11-12 ENCOUNTER — Inpatient Hospital Stay: Payer: Medicare HMO

## 2018-11-12 DIAGNOSIS — I509 Heart failure, unspecified: Secondary | ICD-10-CM | POA: Insufficient documentation

## 2018-11-12 DIAGNOSIS — R5383 Other fatigue: Secondary | ICD-10-CM | POA: Diagnosis not present

## 2018-11-12 DIAGNOSIS — C50212 Malignant neoplasm of upper-inner quadrant of left female breast: Secondary | ICD-10-CM | POA: Diagnosis not present

## 2018-11-12 DIAGNOSIS — Z170 Estrogen receptor positive status [ER+]: Secondary | ICD-10-CM | POA: Insufficient documentation

## 2018-11-12 DIAGNOSIS — D649 Anemia, unspecified: Secondary | ICD-10-CM | POA: Diagnosis not present

## 2018-11-12 DIAGNOSIS — Z79899 Other long term (current) drug therapy: Secondary | ICD-10-CM | POA: Insufficient documentation

## 2018-11-12 DIAGNOSIS — R5381 Other malaise: Secondary | ICD-10-CM | POA: Diagnosis not present

## 2018-11-12 DIAGNOSIS — Z7982 Long term (current) use of aspirin: Secondary | ICD-10-CM | POA: Diagnosis not present

## 2018-11-12 DIAGNOSIS — Z95828 Presence of other vascular implants and grafts: Secondary | ICD-10-CM

## 2018-11-12 DIAGNOSIS — I130 Hypertensive heart and chronic kidney disease with heart failure and stage 1 through stage 4 chronic kidney disease, or unspecified chronic kidney disease: Secondary | ICD-10-CM | POA: Diagnosis not present

## 2018-11-12 DIAGNOSIS — E1122 Type 2 diabetes mellitus with diabetic chronic kidney disease: Secondary | ICD-10-CM

## 2018-11-12 DIAGNOSIS — Z87891 Personal history of nicotine dependence: Secondary | ICD-10-CM | POA: Diagnosis not present

## 2018-11-12 DIAGNOSIS — Z803 Family history of malignant neoplasm of breast: Secondary | ICD-10-CM

## 2018-11-12 DIAGNOSIS — Z7189 Other specified counseling: Secondary | ICD-10-CM

## 2018-11-12 DIAGNOSIS — M899 Disorder of bone, unspecified: Secondary | ICD-10-CM | POA: Diagnosis not present

## 2018-11-12 DIAGNOSIS — N184 Chronic kidney disease, stage 4 (severe): Secondary | ICD-10-CM | POA: Diagnosis not present

## 2018-11-12 DIAGNOSIS — Z794 Long term (current) use of insulin: Secondary | ICD-10-CM | POA: Insufficient documentation

## 2018-11-12 DIAGNOSIS — Z8041 Family history of malignant neoplasm of ovary: Secondary | ICD-10-CM

## 2018-11-12 DIAGNOSIS — Z800 Family history of malignant neoplasm of digestive organs: Secondary | ICD-10-CM | POA: Diagnosis not present

## 2018-11-12 DIAGNOSIS — Z5111 Encounter for antineoplastic chemotherapy: Secondary | ICD-10-CM | POA: Diagnosis not present

## 2018-11-12 LAB — CBC WITH DIFFERENTIAL/PLATELET
Abs Immature Granulocytes: 0.07 10*3/uL (ref 0.00–0.07)
Basophils Absolute: 0 10*3/uL (ref 0.0–0.1)
Basophils Relative: 1 %
Eosinophils Absolute: 0.1 10*3/uL (ref 0.0–0.5)
Eosinophils Relative: 1 %
HCT: 28.7 % — ABNORMAL LOW (ref 36.0–46.0)
Hemoglobin: 9.7 g/dL — ABNORMAL LOW (ref 12.0–15.0)
Immature Granulocytes: 1 %
Lymphocytes Relative: 12 %
Lymphs Abs: 0.8 10*3/uL (ref 0.7–4.0)
MCH: 31.2 pg (ref 26.0–34.0)
MCHC: 33.8 g/dL (ref 30.0–36.0)
MCV: 92.3 fL (ref 80.0–100.0)
Monocytes Absolute: 0.7 10*3/uL (ref 0.1–1.0)
Monocytes Relative: 10 %
Neutro Abs: 4.9 10*3/uL (ref 1.7–7.7)
Neutrophils Relative %: 75 %
Platelets: 357 10*3/uL (ref 150–400)
RBC: 3.11 MIL/uL — ABNORMAL LOW (ref 3.87–5.11)
RDW: 12.8 % (ref 11.5–15.5)
WBC: 6.6 10*3/uL (ref 4.0–10.5)
nRBC: 0 % (ref 0.0–0.2)

## 2018-11-12 LAB — COMPREHENSIVE METABOLIC PANEL
ALT: 13 U/L (ref 0–44)
AST: 16 U/L (ref 15–41)
Albumin: 3.8 g/dL (ref 3.5–5.0)
Alkaline Phosphatase: 75 U/L (ref 38–126)
Anion gap: 7 (ref 5–15)
BUN: 34 mg/dL — ABNORMAL HIGH (ref 8–23)
CO2: 24 mmol/L (ref 22–32)
Calcium: 8.4 mg/dL — ABNORMAL LOW (ref 8.9–10.3)
Chloride: 105 mmol/L (ref 98–111)
Creatinine, Ser: 3.42 mg/dL — ABNORMAL HIGH (ref 0.44–1.00)
GFR calc Af Amer: 16 mL/min — ABNORMAL LOW (ref >60)
GFR calc non Af Amer: 14 mL/min — ABNORMAL LOW (ref >60)
Glucose, Bld: 95 mg/dL (ref 70–99)
Potassium: 4.1 mmol/L (ref 3.5–5.1)
Sodium: 136 mmol/L (ref 135–145)
Total Bilirubin: 0.5 mg/dL (ref 0.3–1.2)
Total Protein: 7.3 g/dL (ref 6.5–8.1)

## 2018-11-12 MED ORDER — HEPARIN SOD (PORK) LOCK FLUSH 100 UNIT/ML IV SOLN
500.0000 [IU] | Freq: Once | INTRAVENOUS | Status: AC | PRN
  Administered 2018-11-12: 500 [IU]
  Filled 2018-11-12: qty 5

## 2018-11-12 MED ORDER — SODIUM CHLORIDE 0.9 % IV SOLN
60.0000 mg/m2 | Freq: Once | INTRAVENOUS | Status: AC
  Administered 2018-11-12: 114 mg via INTRAVENOUS
  Filled 2018-11-12: qty 19

## 2018-11-12 MED ORDER — DIPHENHYDRAMINE HCL 50 MG/ML IJ SOLN
50.0000 mg | Freq: Once | INTRAMUSCULAR | Status: AC
  Administered 2018-11-12: 10:00:00 50 mg via INTRAVENOUS
  Filled 2018-11-12: qty 1

## 2018-11-12 MED ORDER — FAMOTIDINE IN NACL 20-0.9 MG/50ML-% IV SOLN
20.0000 mg | Freq: Once | INTRAVENOUS | Status: AC
  Administered 2018-11-12: 20 mg via INTRAVENOUS
  Filled 2018-11-12: qty 50

## 2018-11-12 MED ORDER — SODIUM CHLORIDE 0.9 % IV SOLN
Freq: Once | INTRAVENOUS | Status: AC
  Administered 2018-11-12: 09:00:00 via INTRAVENOUS
  Filled 2018-11-12: qty 250

## 2018-11-12 MED ORDER — SODIUM CHLORIDE 0.9% FLUSH
10.0000 mL | Freq: Once | INTRAVENOUS | Status: AC
  Administered 2018-11-12: 10 mL via INTRAVENOUS
  Filled 2018-11-12: qty 10

## 2018-11-12 MED ORDER — DEXAMETHASONE SODIUM PHOSPHATE 10 MG/ML IJ SOLN
8.0000 mg | Freq: Once | INTRAMUSCULAR | Status: AC
  Administered 2018-11-12: 8 mg via INTRAVENOUS
  Filled 2018-11-12: qty 1

## 2018-11-12 NOTE — Assessment & Plan Note (Addendum)
Left breast cancer- stage IV- ER/PR +, HER-2/neu - on Taxol chemotherapy; September 29, 2018 PET scan-stable bone lesions/left breast mass; decreasing effusions/left lung atelectasis; prominent right pelvic wall lymph node[see discussion below]. slow rise in Tumor markers;stable..  # Proceed with Taxol every 2 weeks. Labs today reviewed;  acceptable for treatment today.   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   # Chronic kidney disease - stage IV-stable.  # Anemia- hemoglobin today-9-10; stable.on PO iron.   #  DISPOSITION:  # Treatment today # follow up on 05/01 [covid/fridays] /labs/MD-cbc/cmp-ca-27-29; -Taxol-Dr.B

## 2018-11-12 NOTE — Progress Notes (Signed)
[place] OFFICE PROGRESS NOTE  Patient Care Team: [name], [name], [name]; [name]; [name]; [name]; EBUS- Positive Paratrac/subcarinal LN s/p ? Taxotere [in [place]; [name]; [name];[name]; [name]; suv 3]; [name]; [name]; [name];  [name]; Ant R iliac bone; [name]; [name]; START-Taxol weekly. March 2020- Taxol every 2 weeks [PN]  # Poorly controlled Blood sugars- improved.   # Pancreatitis Hx/PEI- on creon in past / CKD IV [creat ~ 3-4; [name]; [name]; mild left sided weakness]  # Jan 2020-  Lobular lesion on tongue- s/p excision pyogenic granuloma [Dr.McQueen]   # GENETIC TESTING/COUNSELLING: HETEROZYGOUS Cystic Fibrosis Gene [explains hx of recurrent pancreatitis]  # MOLECULAR TESTING: NA  ------------------------------------------------   DIAGNOSIS: [ 2015] BREAST CA; [name]; [name] ;GOALS: Palliative  CURRENT/MOST RECENT THERAPY [ April 2019] TAXOL      Carcinoma of upper-inner quadrant of left breast in female, estrogen receptor positive ([place])      INTERVAL HISTORY:  [name] 63 y.o.  female pleasant patient above history of ER PR positive HER-2 negative breast cancer on Taxol is here for  follow-up.  Tingling and numbness improved since starting Neurontin.  No falls.  No nausea no vomiting.  No worsening shortness of breath or cough.  No fevers or chills.   Review of Systems  Constitutional: Positive for malaise/fatigue. Negative for chills, diaphoresis, [name], hemoptysis, sputum production, shortness of breath and wheezing.   Cardiovascular: Positive for leg swelling. Negative for chest pain, palpitations and orthopnea.  Gastrointestinal: Negative for abdominal pain, blood in stool, constipation, diarrhea, heartburn, melena, nausea and vomiting.  Genitourinary: Negative for dysuria, frequency and urgency.  Musculoskeletal: Negative for back pain and joint pain.  Skin: Negative.  Negative for itching and rash.  Neurological: Positive for tingling. Negative for dizziness, focal weakness, weakness and headaches.  Endo/Heme/Allergies: Does not bruise/bleed easily.  Psychiatric/Behavioral: Negative for depression. The patient is not nervous/anxious and does not have insomnia.       PAST MEDICAL HISTORY :  Past Medical History:  Diagnosis Date  . Anemia   . Asthma   . Cancer ([place]) 03/10/2018   Per [facility] PET order. Carcinoma of upper-inner quadrant of left breast in female, estrogen receptor positive .  Marland Kitchen CHF (congestive heart failure) ([place]) 1997  . CKD (chronic kidney disease)   . Depression   . Diabetes mellitus, type 2 ([place])   . Family history of breast cancer   . Family history of colon cancer   . Family history of ovarian cancer   . Family history of pancreatic cancer   . Family history of prostate cancer   . Family history  of stomach cancer   . Hair loss   . History of left breast cancer 05/29/14  . History of partial hysterectomy 12/31/2016   Per patient.  Has not had a period in years.  Had a partial hysterectomy years ago.  Marland Kitchen  Hypertension   . Obesity   . Pancreatitis 1997  . Stroke [facility]) 2010   with mild left arm weakness    PAST SURGICAL HISTORY :   Past Surgical History:  Procedure Laterality Date  . CESAREAN SECTION    . CHOLECYSTECTOMY    . EXCISION OF TONGUE LESION N/A 08/17/2018   Procedure: EXCISION OF TONGUE LESION WITH FROZEN SECTIONS;  Surgeon: Beverly Gust, MD;  Location: [facility] ORS;  Service: ENT;  Laterality: N/A;  . PARTIAL HYSTERECTOMY  12/31/2016   Per patient, she has not had a period in years since she had a partial hysterectomy.    FAMILY HISTORY :   Family History  Problem Relation Age of Onset  . Ovarian cancer Mother 50  . Diabetes Mother   . Hypertension Mother   . COPD Father   . Hypertension Father   . Colon cancer Father 87  . Diabetes Sister   . Breast cancer Sister 22       bilateral  . Diabetes Brother   . Leukemia Maternal Aunt   . Pancreatic cancer Paternal Aunt 22  . Pancreatic cancer Paternal Uncle   . Colon cancer Paternal Uncle   . Stomach cancer Maternal Grandfather 4  . Throat cancer Paternal Grandmother   . Breast cancer Maternal Aunt 80  . Colon cancer Maternal Aunt   . Bone cancer Maternal Aunt   . Breast cancer Paternal Aunt        dx >50  . Prostate cancer Paternal Uncle   . Pancreatic cancer Paternal Uncle   . Throat cancer Paternal Uncle   . Lung cancer Paternal Uncle   . Stomach cancer Paternal Uncle   . Brain cancer Paternal Aunt   . Cancer Cousin        liver, kidney  . Prostate cancer Cousin        meastatic  . Lung cancer Other     SOCIAL HISTORY:   Social History   Tobacco Use  . Smoking status: Former Smoker    Packs/day: 0.50    Years: 1.00    Pack years: 0.50    Types: Cigarettes  . Smokeless tobacco: Never Used  Substance Use Topics  . Alcohol use: No    Alcohol/week: 0.0 standard drinks  . Drug use: No    ALLERGIES:  has No Known Allergies.  MEDICATIONS:  Current Outpatient Medications  Medication Sig  Dispense Refill  . albuterol (PROAIR HFA) 108 (90 BASE) MCG/ACT inhaler Inhale 2 puffs into the lungs every 6 (six) hours as needed for wheezing or shortness of breath.     Marland Kitchen albuterol (PROVENTIL) (2.5 MG/3ML) 0.083% nebulizer solution Inhale 2.5 mg into the lungs every 6 (six) hours as needed for shortness of breath.     . ALPRAZolam (XANAX) 0.5 MG tablet Take 0.5 mg by mouth at bedtime as needed for anxiety or sleep.     Marland Kitchen amLODipine (NORVASC) 10 MG tablet Take 10 mg by mouth daily.     Marland Kitchen aspirin EC 81 MG tablet Take 81 mg by mouth daily.     Marland Kitchen atenolol (TENORMIN) 100 MG tablet Take 100 mg by mouth 2 (two) times daily.     . B-D ULTRA-FINE 33  LANCETS MISC Use 1 each 2 (two) times daily.    . B-D ULTRAFINE III SHORT PEN 31G X 8 MM MISC     . bumetanide (BUMEX) 0.5 MG tablet Take 0.5 mg by mouth 2 (two) times daily.     . calcitRIOL (ROCALTROL) 0.25 MCG capsule Take 0.25 mcg by mouth every Monday, Wednesday, and Friday.     . Cinnamon 500 MG capsule Take 1,000 mg by mouth daily.     . cloNIDine (CATAPRES) 0.2 MG tablet Take 0.2 mg by mouth 2 (two) times daily.     . enalapril (VASOTEC) 20 MG tablet Take 20 mg by mouth 2 (two) times daily.     . famotidine (PEPCID) 20 MG tablet Take 20 mg by mouth 2 (two) times daily.    . ferrous sulfate 325 (65 FE) MG tablet Take 325 mg by mouth 2 (two) times daily with a meal.    . FLUoxetine (PROZAC) 20 MG capsule Take 20 mg by mouth 2 (two) times daily.     . Fluticasone Propionate, Inhal, (FLOVENT DISKUS) 100 MCG/BLIST AEPB Inhale into the lungs. Inhale 2 inhalations into the lungs 2 times daily PRN    . gabapentin (NEURONTIN) 100 MG capsule Take 1 capsule (100 mg total) by mouth at bedtime. 30 capsule 2  . glucose blood (ONE TOUCH ULTRA TEST) test strip Use 1 each 2 (two) times daily. Use as instructed.    . glyBURIDE (DIABETA) 5 MG tablet Take 10 mg by mouth 2 (two) times daily with a meal.     . LEVEMIR FLEXTOUCH 100 UNIT/ML Pen Inject 55 Units into the  skin daily.     Marland Kitchen loperamide (IMODIUM A-D) 2 MG capsule Take 2 mg by mouth as needed for diarrhea or loose stools.    . mometasone (NASONEX) 50 MCG/ACT nasal spray Place 2 sprays into the nose daily as needed (Allergies).     . NOVOLOG FLEXPEN 100 UNIT/ML FlexPen Inject 7 Units into the skin 2 (two) times daily.     . ondansetron (ZOFRAN) 8 MG tablet One pill every 8 hours as needed for nausea/vomitting. 40 tablet 1  . prochlorperazine (COMPAZINE) 10 MG tablet Take 1 tablet (10 mg total) by mouth every 6 (six) hours as needed for nausea or vomiting. Please note change in strength 60 tablet 4  . salmeterol (SEREVENT) 50 MCG/DOSE diskus inhaler Inhale 1 puff into the lungs daily as needed (shortness of breath).     . simvastatin (ZOCOR) 20 MG tablet Take 20 mg by mouth daily at 6 PM.     . vitamin B-12 (CYANOCOBALAMIN) 1000 MCG tablet Take 1,000 mcg by mouth daily.    Marland Kitchen acetaminophen-codeine (TYLENOL #4) 300-60 MG tablet Take 2 tablets by mouth every 6 (six) hours as needed for moderate pain.      No current facility-administered medications for this visit.    Facility-Administered Medications Ordered in Other Visits  Medication Dose Route Frequency Provider Last Rate Last Dose  . 0.9 %  sodium chloride infusion   Intravenous Once Charlaine Dalton R, MD      . dexamethasone (DECADRON) injection 8 mg  8 mg Intravenous Once Charlaine Dalton R, MD      . diphenhydrAMINE (BENADRYL) injection 50 mg  50 mg Intravenous Once Charlaine Dalton R, MD      . famotidine (PEPCID) IVPB 20 mg premix  20 mg Intravenous Once Charlaine Dalton R, MD      . heparin lock flush 100 unit/mL  500 Units Intracatheter Once PRN Cammie Sickle, MD      . PACLitaxel (TAXOL) 114 mg in sodium chloride 0.9 % 250 mL chemo infusion (</= 65m/m2)  60 mg/m2 (Treatment Plan Recorded) Intravenous Once BCharlaine DaltonR, MD      . sodium chloride flush (NS) 0.9 % injection 10 mL  10 mL Intravenous PRN BCammie Sickle MD   10 mL at 01/30/16 1054    PHYSICAL EXAMINATION: ECOG PERFORMANCE STATUS: 1 - Symptomatic but completely ambulatory  BP 121/70   Pulse 70   Temp 97.9 F (36.6 C) (Tympanic)   Resp 20   Ht _0  (1.575 m)   Wt 206 lb (93.4 kg)   BMI 37.68 kg/m   Filed Weights   11/12/18 0847  Weight: 206 lb (93.4 kg)    Physical Exam  Constitutional: She is oriented to person, place, and time and well-developed, well-nourished, and in no distress.  She is alone.  HENT:  Head: Normocephalic and atraumatic.  Mouth/Throat: Oropharynx is clear and moist. No oropharyngeal exudate.  Eyes: Pupils are equal, round, and reactive to light.  Neck: Normal range of motion. Neck supple.  Cardiovascular: Normal rate and regular rhythm.  Pulmonary/Chest: Breath sounds normal. No respiratory distress. She has no wheezes.  Abdominal: Soft. Bowel sounds are normal. She exhibits no distension and no mass. There is no abdominal tenderness. There is no rebound and no guarding.  Musculoskeletal: Normal range of motion.        General: No tenderness.  Neurological: She is alert and oriented to person, place, and time.  Skin: Skin is warm.  Psychiatric: Affect normal.       LABORATORY DATA:  I have reviewed the data as listed    Component Value Date/Time   NA 136 11/12/2018 0759   NA 130 (L) 06/06/2014 1102   K 4.1 11/12/2018 0759   K 3.9 06/06/2014 1102   CL 105 11/12/2018 0759   CL 95 (L) 06/06/2014 1102   CO2 24 11/12/2018 0759   CO2 28 06/06/2014 1102   GLUCOSE 95 11/12/2018 0759   GLUCOSE 349 (H) 06/06/2014 1102   BUN 34 (H) 11/12/2018 0759   BUN 17 06/06/2014 1102   CREATININE 3.42 (H) 11/12/2018 0759   CREATININE 1.63 (H) 06/06/2014 1102   CALCIUM 8.4 (L) 11/12/2018 0759   CALCIUM 9.2 06/06/2014 1102   PROT 7.3 11/12/2018 0759   PROT 8.2 06/06/2014 1102   ALBUMIN 3.8 11/12/2018 0759   ALBUMIN 3.3 (L) 06/06/2014 1102   AST 16 11/12/2018 0759   AST 7 (L) 06/06/2014 1102    ALT 13 11/12/2018 0759   ALT 12 (L) 06/06/2014 1102   ALKPHOS 75 11/12/2018 0759   ALKPHOS 74 06/06/2014 1102   BILITOT 0.5 11/12/2018 0759   BILITOT 0.4 06/06/2014 1102   GFRNONAA 14 (L) 11/12/2018 0759   GFRNONAA 35 (L) 06/06/2014 1102   GFRAA 16 (L) 11/12/2018 0759   GFRAA 42 (L) 06/06/2014 1102    No results found for: SPEP, UPEP  Lab Results  Component Value Date   WBC 6.6 11/12/2018   NEUTROABS 4.9 11/12/2018   HGB 9.7 (L) 11/12/2018   HCT 28.7 (L) 11/12/2018   MCV 92.3 11/12/2018   PLT 357 11/12/2018      Chemistry      Component Value Date/Time   NA 136 11/12/2018 0759   NA 130 (L) 06/06/2014 1102   K 4.1 11/12/2018 0759   K 3.9 06/06/2014 1102  CL 105 11/12/2018 0759   CL 95 (L) 06/06/2014 1102   CO2 24 11/12/2018 0759   CO2 28 06/06/2014 1102   BUN 34 (H) 11/12/2018 0759   BUN 17 06/06/2014 1102   CREATININE 3.42 (H) 11/12/2018 0759   CREATININE 1.63 (H) 06/06/2014 1102      Component Value Date/Time   CALCIUM 8.4 (L) 11/12/2018 0759   CALCIUM 9.2 06/06/2014 1102   ALKPHOS 75 11/12/2018 0759   ALKPHOS 74 06/06/2014 1102   AST 16 11/12/2018 0759   AST 7 (L) 06/06/2014 1102   ALT 13 11/12/2018 0759   ALT 12 (L) 06/06/2014 1102   BILITOT 0.5 11/12/2018 0759   BILITOT 0.4 06/06/2014 1102       RADIOGRAPHIC STUDIES: I have personally reviewed the radiological images as listed and agreed with the findings in the report. No results found.   ASSESSMENT & PLAN:  Carcinoma of upper-inner quadrant of left breast in female, estrogen receptor positive ([place]) Left breast cancer- stage IV- ER/PR +, HER-2/neu - on Taxol chemotherapy; September 29, 2018 PET scan-stable bone lesions/left breast mass; decreasing effusions/left lung atelectasis; prominent right pelvic wall lymph node[see discussion below]. slow rise in Tumor markers;stable..  # Proceed with Taxol every 2 weeks. Labs today reviewed;  acceptable for treatment today.   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   # Chronic kidney disease - stage IV-stable.  # Anemia- hemoglobin today-9-10; stable.on PO iron.   #  DISPOSITION:  # Treatment today # follow up on 05/01 [covid] /labs/MD-cbc/cmp-ca-27-29; -Taxol-Dr.B   No orders of the defined types were placed in this encounter.  All questions were answered. The patient knows to call the clinic with any problems, questions or concerns.      Cammie Sickle, MD 11/12/2018 9:09 AM

## 2018-11-13 LAB — CANCER ANTIGEN 27.29: CA 27.29: 75.2 U/mL — ABNORMAL HIGH (ref 0.0–38.6)

## 2018-12-03 ENCOUNTER — Inpatient Hospital Stay (HOSPITAL_BASED_OUTPATIENT_CLINIC_OR_DEPARTMENT_OTHER): Payer: Medicare HMO | Admitting: Internal Medicine

## 2018-12-03 ENCOUNTER — Other Ambulatory Visit: Payer: Self-pay

## 2018-12-03 ENCOUNTER — Inpatient Hospital Stay: Payer: Medicare HMO

## 2018-12-03 ENCOUNTER — Inpatient Hospital Stay: Payer: Medicare HMO | Attending: Internal Medicine

## 2018-12-03 ENCOUNTER — Encounter: Payer: Self-pay | Admitting: Internal Medicine

## 2018-12-03 VITALS — BP 128/74 | HR 70 | Temp 95.6°F | Resp 20 | Ht 62.0 in | Wt 210.0 lb

## 2018-12-03 DIAGNOSIS — Z79899 Other long term (current) drug therapy: Secondary | ICD-10-CM | POA: Insufficient documentation

## 2018-12-03 DIAGNOSIS — M899 Disorder of bone, unspecified: Secondary | ICD-10-CM | POA: Insufficient documentation

## 2018-12-03 DIAGNOSIS — Z8041 Family history of malignant neoplasm of ovary: Secondary | ICD-10-CM

## 2018-12-03 DIAGNOSIS — I69359 Hemiplegia and hemiparesis following cerebral infarction affecting unspecified side: Secondary | ICD-10-CM | POA: Diagnosis not present

## 2018-12-03 DIAGNOSIS — J45909 Unspecified asthma, uncomplicated: Secondary | ICD-10-CM | POA: Diagnosis not present

## 2018-12-03 DIAGNOSIS — I130 Hypertensive heart and chronic kidney disease with heart failure and stage 1 through stage 4 chronic kidney disease, or unspecified chronic kidney disease: Secondary | ICD-10-CM

## 2018-12-03 DIAGNOSIS — Z800 Family history of malignant neoplasm of digestive organs: Secondary | ICD-10-CM

## 2018-12-03 DIAGNOSIS — Z170 Estrogen receptor positive status [ER+]: Secondary | ICD-10-CM

## 2018-12-03 DIAGNOSIS — E1122 Type 2 diabetes mellitus with diabetic chronic kidney disease: Secondary | ICD-10-CM

## 2018-12-03 DIAGNOSIS — Z7189 Other specified counseling: Secondary | ICD-10-CM

## 2018-12-03 DIAGNOSIS — R5383 Other fatigue: Secondary | ICD-10-CM | POA: Insufficient documentation

## 2018-12-03 DIAGNOSIS — R200 Anesthesia of skin: Secondary | ICD-10-CM

## 2018-12-03 DIAGNOSIS — Z5111 Encounter for antineoplastic chemotherapy: Secondary | ICD-10-CM | POA: Diagnosis not present

## 2018-12-03 DIAGNOSIS — Z95828 Presence of other vascular implants and grafts: Secondary | ICD-10-CM

## 2018-12-03 DIAGNOSIS — C50212 Malignant neoplasm of upper-inner quadrant of left female breast: Secondary | ICD-10-CM

## 2018-12-03 DIAGNOSIS — D649 Anemia, unspecified: Secondary | ICD-10-CM

## 2018-12-03 DIAGNOSIS — Z794 Long term (current) use of insulin: Secondary | ICD-10-CM | POA: Diagnosis not present

## 2018-12-03 DIAGNOSIS — Z803 Family history of malignant neoplasm of breast: Secondary | ICD-10-CM | POA: Diagnosis not present

## 2018-12-03 DIAGNOSIS — R110 Nausea: Secondary | ICD-10-CM | POA: Diagnosis not present

## 2018-12-03 DIAGNOSIS — Z7951 Long term (current) use of inhaled steroids: Secondary | ICD-10-CM

## 2018-12-03 DIAGNOSIS — Z7982 Long term (current) use of aspirin: Secondary | ICD-10-CM | POA: Insufficient documentation

## 2018-12-03 DIAGNOSIS — I509 Heart failure, unspecified: Secondary | ICD-10-CM

## 2018-12-03 DIAGNOSIS — Z87891 Personal history of nicotine dependence: Secondary | ICD-10-CM | POA: Insufficient documentation

## 2018-12-03 DIAGNOSIS — R600 Localized edema: Secondary | ICD-10-CM

## 2018-12-03 DIAGNOSIS — K861 Other chronic pancreatitis: Secondary | ICD-10-CM

## 2018-12-03 DIAGNOSIS — I6359 Cerebral infarction due to unspecified occlusion or stenosis of other cerebral artery: Secondary | ICD-10-CM

## 2018-12-03 DIAGNOSIS — R202 Paresthesia of skin: Secondary | ICD-10-CM | POA: Insufficient documentation

## 2018-12-03 DIAGNOSIS — R5381 Other malaise: Secondary | ICD-10-CM

## 2018-12-03 DIAGNOSIS — N184 Chronic kidney disease, stage 4 (severe): Secondary | ICD-10-CM | POA: Diagnosis not present

## 2018-12-03 LAB — CBC WITH DIFFERENTIAL/PLATELET
Abs Immature Granulocytes: 0.1 10*3/uL — ABNORMAL HIGH (ref 0.00–0.07)
Basophils Absolute: 0 10*3/uL (ref 0.0–0.1)
Basophils Relative: 1 %
Eosinophils Absolute: 0.1 10*3/uL (ref 0.0–0.5)
Eosinophils Relative: 2 %
HCT: 29.6 % — ABNORMAL LOW (ref 36.0–46.0)
Hemoglobin: 10.1 g/dL — ABNORMAL LOW (ref 12.0–15.0)
Immature Granulocytes: 1 %
Lymphocytes Relative: 17 %
Lymphs Abs: 1.3 10*3/uL (ref 0.7–4.0)
MCH: 30.9 pg (ref 26.0–34.0)
MCHC: 34.1 g/dL (ref 30.0–36.0)
MCV: 90.5 fL (ref 80.0–100.0)
Monocytes Absolute: 1 10*3/uL (ref 0.1–1.0)
Monocytes Relative: 13 %
Neutro Abs: 5 10*3/uL (ref 1.7–7.7)
Neutrophils Relative %: 66 %
Platelets: 220 10*3/uL (ref 150–400)
RBC: 3.27 MIL/uL — ABNORMAL LOW (ref 3.87–5.11)
RDW: 12.3 % (ref 11.5–15.5)
WBC: 7.5 10*3/uL (ref 4.0–10.5)
nRBC: 0 % (ref 0.0–0.2)

## 2018-12-03 LAB — COMPREHENSIVE METABOLIC PANEL
ALT: 15 U/L (ref 0–44)
AST: 16 U/L (ref 15–41)
Albumin: 3.9 g/dL (ref 3.5–5.0)
Alkaline Phosphatase: 75 U/L (ref 38–126)
Anion gap: 10 (ref 5–15)
BUN: 38 mg/dL — ABNORMAL HIGH (ref 8–23)
CO2: 22 mmol/L (ref 22–32)
Calcium: 8.5 mg/dL — ABNORMAL LOW (ref 8.9–10.3)
Chloride: 102 mmol/L (ref 98–111)
Creatinine, Ser: 3.26 mg/dL — ABNORMAL HIGH (ref 0.44–1.00)
GFR calc Af Amer: 17 mL/min — ABNORMAL LOW (ref >60)
GFR calc non Af Amer: 15 mL/min — ABNORMAL LOW (ref >60)
Glucose, Bld: 111 mg/dL — ABNORMAL HIGH (ref 70–99)
Potassium: 4.1 mmol/L (ref 3.5–5.1)
Sodium: 134 mmol/L — ABNORMAL LOW (ref 135–145)
Total Bilirubin: 0.5 mg/dL (ref 0.3–1.2)
Total Protein: 7.3 g/dL (ref 6.5–8.1)

## 2018-12-03 MED ORDER — SODIUM CHLORIDE 0.9 % IV SOLN
Freq: Once | INTRAVENOUS | Status: AC
  Administered 2018-12-03: 10:00:00 via INTRAVENOUS
  Filled 2018-12-03: qty 250

## 2018-12-03 MED ORDER — SODIUM CHLORIDE 0.9% FLUSH
10.0000 mL | Freq: Once | INTRAVENOUS | Status: AC
  Administered 2018-12-03: 08:00:00 10 mL via INTRAVENOUS
  Filled 2018-12-03: qty 10

## 2018-12-03 MED ORDER — DIPHENHYDRAMINE HCL 50 MG/ML IJ SOLN
50.0000 mg | Freq: Once | INTRAMUSCULAR | Status: AC
  Administered 2018-12-03: 50 mg via INTRAVENOUS
  Filled 2018-12-03: qty 1

## 2018-12-03 MED ORDER — SODIUM CHLORIDE 0.9 % IV SOLN
60.0000 mg/m2 | Freq: Once | INTRAVENOUS | Status: AC
  Administered 2018-12-03: 114 mg via INTRAVENOUS
  Filled 2018-12-03: qty 19

## 2018-12-03 MED ORDER — FAMOTIDINE IN NACL 20-0.9 MG/50ML-% IV SOLN
20.0000 mg | Freq: Once | INTRAVENOUS | Status: AC
  Administered 2018-12-03: 20 mg via INTRAVENOUS
  Filled 2018-12-03: qty 50

## 2018-12-03 MED ORDER — HEPARIN SOD (PORK) LOCK FLUSH 100 UNIT/ML IV SOLN
500.0000 [IU] | Freq: Once | INTRAVENOUS | Status: AC
  Administered 2018-12-03: 11:00:00 500 [IU] via INTRAVENOUS
  Filled 2018-12-03: qty 5

## 2018-12-03 MED ORDER — DEXAMETHASONE SODIUM PHOSPHATE 10 MG/ML IJ SOLN
8.0000 mg | Freq: Once | INTRAMUSCULAR | Status: AC
  Administered 2018-12-03: 8 mg via INTRAVENOUS
  Filled 2018-12-03: qty 1

## 2018-12-03 NOTE — Assessment & Plan Note (Addendum)
Left breast cancer- stage IV- ER/PR +, HER-2/neu - on Taxol chemotherapy; September 29, 2018 PET scan-stable bone lesions/left breast mass; decreasing effusions/left lung atelectasis; prominent right pelvic wall lymph node[see discussion below]. Cl STABLE; but rise in Tumor markers.   # Proceed with Taxol every 2 weeks. Labs today reviewed;  acceptable for treatment today.  Will order scan at next visit.  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   # Chronic kidney disease - stage IV-ctreat- 3.26-STABLE.  # Anemia- hemoglobin today-9-10; STABLE. on PO iron.   #  DISPOSITION:  # Treatment today # follow up in 2 weeks /labs/MD-cbc/cmp-ca-27-29; -Taxol-Dr.B

## 2018-12-03 NOTE — Progress Notes (Signed)
[facility] OFFICE PROGRESS NOTE  Patient Care Team: [name], [name], [name]; [name]; [name]; [name]; EBUS- Positive Paratrac/subcarinal LN s/p ? Taxotere [in [place]; [name]; [name];[name]; [name]; suv 3]; [name]; [name]; [name];  [name]; Ant R iliac bone; [name]; [name]; START-Taxol weekly. March 2020- Taxol every 2 weeks [PN]  # Poorly controlled Blood sugars- improved.   # Pancreatitis Hx/PEI- on creon in past / CKD IV [creat ~ 3-4; [name]; [name]; mild left sided weakness]  # Jan 2020-  Lobular lesion on tongue- s/p excision pyogenic granuloma [Dr.McQueen]   # GENETIC TESTING/COUNSELLING: HETEROZYGOUS Cystic Fibrosis Gene [explains hx of recurrent pancreatitis]  # MOLECULAR TESTING: NA  ------------------------------------------------   DIAGNOSIS: [ 2015] BREAST CA; [name]; [name] ;GOALS: Palliative  CURRENT/MOST RECENT THERAPY [ April 2019] TAXOL      Carcinoma of upper-inner quadrant of left breast in female, estrogen receptor positive ([place])      INTERVAL HISTORY:  [name] 63 y.o.  female pleasant patient above history of ER PR positive HER-2 negative breast cancer on Taxol is here for  follow-up.  Patient continues to have mild tingling and numbness in extremities.  Not any worse.  She continues to be on Neurontin.  Mild nausea no vomiting.  Mild swelling in the legs.  No fever chills   Review of Systems  Constitutional: Positive for malaise/fatigue. Negative for chills, diaphoresis, [name], hemoptysis, sputum production, shortness of breath and wheezing.   Cardiovascular: Positive for leg swelling. Negative for chest pain, palpitations and orthopnea.  Gastrointestinal: Negative for abdominal pain, blood in stool, constipation, diarrhea, heartburn, melena, nausea and vomiting.  Genitourinary: Negative for dysuria, frequency and urgency.  Musculoskeletal: Negative for back pain and joint pain.  Skin: Negative.  Negative for itching and rash.  Neurological: Positive for tingling. Negative for dizziness, focal weakness, weakness and headaches.  Endo/Heme/Allergies: Does not bruise/bleed easily.  Psychiatric/Behavioral: Negative for depression. The patient is not nervous/anxious and does not have insomnia.       PAST MEDICAL HISTORY :  Past Medical History:  Diagnosis Date  . Anemia   . Asthma   . Cancer ([place]) 03/10/2018   Per [facility] PET order. Carcinoma of upper-inner quadrant of left breast in female, estrogen receptor positive .  Marland Kitchen CHF (congestive heart failure) ([place]) 1997  . CKD (chronic kidney disease)   . Depression   . Diabetes mellitus, type 2 ([place])   . Family history of breast cancer   . Family history of colon cancer   . Family history of ovarian cancer   . Family history of pancreatic cancer   . Family history  of prostate cancer   . Family history of stomach cancer   . Hair loss   . History of left breast cancer 05/29/14  . History of partial hysterectomy 12/31/2016   Per patient.  Has not had a period in years.  Had a  partial hysterectomy years ago.  Marland Kitchen Hypertension   . Obesity   . Pancreatitis 1997  . Stroke [facility]) 2010   with mild left arm weakness    PAST SURGICAL HISTORY :   Past Surgical History:  Procedure Laterality Date  . CESAREAN SECTION    . CHOLECYSTECTOMY    . EXCISION OF TONGUE LESION N/A 08/17/2018   Procedure: EXCISION OF TONGUE LESION WITH FROZEN SECTIONS;  Surgeon: Beverly Gust, MD;  Location: [facility] ORS;  Service: ENT;  Laterality: N/A;  . PARTIAL HYSTERECTOMY  12/31/2016   Per patient, she has not had a period in years since she had a partial hysterectomy.    FAMILY HISTORY :   Family History  Problem Relation Age of Onset  . Ovarian cancer Mother 63  . Diabetes Mother   . Hypertension Mother   . COPD Father   . Hypertension Father   . Colon cancer Father 4  . Diabetes Sister   . Breast cancer Sister 80       bilateral  . Diabetes Brother   . Leukemia Maternal Aunt   . Pancreatic cancer Paternal Aunt 27  . Pancreatic cancer Paternal Uncle   . Colon cancer Paternal Uncle   . Stomach cancer Maternal Grandfather 83  . Throat cancer Paternal Grandmother   . Breast cancer Maternal Aunt 80  . Colon cancer Maternal Aunt   . Bone cancer Maternal Aunt   . Breast cancer Paternal Aunt        dx >50  . Prostate cancer Paternal Uncle   . Pancreatic cancer Paternal Uncle   . Throat cancer Paternal Uncle   . Lung cancer Paternal Uncle   . Stomach cancer Paternal Uncle   . Brain cancer Paternal Aunt   . Cancer Cousin        liver, kidney  . Prostate cancer Cousin        meastatic  . Lung cancer Other     SOCIAL HISTORY:   Social History   Tobacco Use  . Smoking status: Former Smoker    Packs/day: 0.50    Years: 1.00    Pack years: 0.50    Types: Cigarettes  . Smokeless tobacco: Never Used  Substance Use Topics  . Alcohol use: No    Alcohol/week: 0.0 standard drinks  . Drug use: No    ALLERGIES:  has No Known Allergies.  MEDICATIONS:  Current Outpatient  Medications  Medication Sig Dispense Refill  . acetaminophen-codeine (TYLENOL #4) 300-60 MG tablet Take 2 tablets by mouth every 6 (six) hours as needed for moderate pain.     Marland Kitchen albuterol (PROAIR HFA) 108 (90 BASE) MCG/ACT inhaler Inhale 2 puffs into the lungs every 6 (six) hours as needed for wheezing or shortness of breath.     Marland Kitchen albuterol (PROVENTIL) (2.5 MG/3ML) 0.083% nebulizer solution Inhale 2.5 mg into the lungs every 6 (six) hours as needed for shortness of breath.     . ALPRAZolam (XANAX) 0.5 MG tablet Take 0.5 mg by mouth at bedtime as needed for anxiety or sleep.     Marland Kitchen amLODipine (NORVASC) 10 MG tablet Take 10 mg by mouth daily.     Marland Kitchen aspirin EC 81 MG tablet  Take 81 mg by mouth daily.     Marland Kitchen atenolol (TENORMIN) 100 MG tablet Take 100 mg by mouth 2 (two) times daily.     . B-D ULTRA-FINE 33 LANCETS MISC Use 1 each 2 (two) times daily.    . B-D ULTRAFINE III SHORT PEN 31G X 8 MM MISC     . bumetanide (BUMEX) 0.5 MG tablet Take 0.5 mg by mouth 2 (two) times daily.     . calcitRIOL (ROCALTROL) 0.25 MCG capsule Take 0.25 mcg by mouth every Monday, Wednesday, and Friday.     . Cinnamon 500 MG capsule Take 1,000 mg by mouth daily.     . cloNIDine (CATAPRES) 0.2 MG tablet Take 0.2 mg by mouth 2 (two) times daily.     . enalapril (VASOTEC) 20 MG tablet Take 20 mg by mouth 2 (two) times daily.     . famotidine (PEPCID) 20 MG tablet Take 20 mg by mouth 2 (two) times daily.    . ferrous sulfate 325 (65 FE) MG tablet Take 325 mg by mouth 2 (two) times daily with a meal.    . FLUoxetine (PROZAC) 20 MG capsule Take 20 mg by mouth 2 (two) times daily.     . Fluticasone Propionate, Inhal, (FLOVENT DISKUS) 100 MCG/BLIST AEPB Inhale into the lungs. Inhale 2 inhalations into the lungs 2 times daily PRN    . gabapentin (NEURONTIN) 100 MG capsule Take 1 capsule (100 mg total) by mouth at bedtime. 30 capsule 2  . glucose blood (ONE TOUCH ULTRA TEST) test strip Use 1 each 2 (two) times daily. Use as  instructed.    . glyBURIDE (DIABETA) 5 MG tablet Take 10 mg by mouth 2 (two) times daily with a meal.     . LEVEMIR FLEXTOUCH 100 UNIT/ML Pen Inject 55 Units into the skin daily.     Marland Kitchen loperamide (IMODIUM A-D) 2 MG capsule Take 2 mg by mouth as needed for diarrhea or loose stools.    . mometasone (NASONEX) 50 MCG/ACT nasal spray Place 2 sprays into the nose daily as needed (Allergies).     . NOVOLOG FLEXPEN 100 UNIT/ML FlexPen Inject 7 Units into the skin 2 (two) times daily.     . ondansetron (ZOFRAN) 8 MG tablet One pill every 8 hours as needed for nausea/vomitting. 40 tablet 1  . prochlorperazine (COMPAZINE) 10 MG tablet Take 1 tablet (10 mg total) by mouth every 6 (six) hours as needed for nausea or vomiting. Please note change in strength 60 tablet 4  . salmeterol (SEREVENT) 50 MCG/DOSE diskus inhaler Inhale 1 puff into the lungs daily as needed (shortness of breath).     . simvastatin (ZOCOR) 20 MG tablet Take 20 mg by mouth daily at 6 PM.     . vitamin B-12 (CYANOCOBALAMIN) 1000 MCG tablet Take 1,000 mcg by mouth daily.     No current facility-administered medications for this visit.    Facility-Administered Medications Ordered in Other Visits  Medication Dose Route Frequency Provider Last Rate Last Dose  . sodium chloride flush (NS) 0.9 % injection 10 mL  10 mL Intravenous PRN Cammie Sickle, MD   10 mL at 01/30/16 1054    PHYSICAL EXAMINATION: ECOG PERFORMANCE STATUS: 1 - Symptomatic but completely ambulatory  BP 128/74 (BP Location: Right Arm, Patient Position: Sitting, Cuff Size: Normal)   Pulse 70   Temp (!) 95.6 F (35.3 C) (Tympanic)   Resp 20   Ht _0  (1.575 m)  Wt 210 lb (95.3 kg)   BMI 38.41 kg/m   Filed Weights   12/03/18 0841  Weight: 210 lb (95.3 kg)    Physical Exam  Constitutional: She is oriented to person, place, and time and well-developed, well-nourished, and in no distress.  She is alone.  HENT:  Head: Normocephalic and atraumatic.   Mouth/Throat: Oropharynx is clear and moist. No oropharyngeal exudate.  Eyes: Pupils are equal, round, and reactive to light.  Neck: Normal range of motion. Neck supple.  Cardiovascular: Normal rate and regular rhythm.  Pulmonary/Chest: Breath sounds normal. No respiratory distress. She has no wheezes.  Abdominal: Soft. Bowel sounds are normal. She exhibits no distension and no mass. There is no abdominal tenderness. There is no rebound and no guarding.  Musculoskeletal: Normal range of motion.        General: No tenderness.  Neurological: She is alert and oriented to person, place, and time.  Skin: Skin is warm.  Psychiatric: Affect normal.       LABORATORY DATA:  I have reviewed the data as listed    Component Value Date/Time   NA 134 (L) 12/03/2018 0756   NA 130 (L) 06/06/2014 1102   K 4.1 12/03/2018 0756   K 3.9 06/06/2014 1102   CL 102 12/03/2018 0756   CL 95 (L) 06/06/2014 1102   CO2 22 12/03/2018 0756   CO2 28 06/06/2014 1102   GLUCOSE 111 (H) 12/03/2018 0756   GLUCOSE 349 (H) 06/06/2014 1102   BUN 38 (H) 12/03/2018 0756   BUN 17 06/06/2014 1102   CREATININE 3.26 (H) 12/03/2018 0756   CREATININE 1.63 (H) 06/06/2014 1102   CALCIUM 8.5 (L) 12/03/2018 0756   CALCIUM 9.2 06/06/2014 1102   PROT 7.3 12/03/2018 0756   PROT 8.2 06/06/2014 1102   ALBUMIN 3.9 12/03/2018 0756   ALBUMIN 3.3 (L) 06/06/2014 1102   AST 16 12/03/2018 0756   AST 7 (L) 06/06/2014 1102   ALT 15 12/03/2018 0756   ALT 12 (L) 06/06/2014 1102   ALKPHOS 75 12/03/2018 0756   ALKPHOS 74 06/06/2014 1102   BILITOT 0.5 12/03/2018 0756   BILITOT 0.4 06/06/2014 1102   GFRNONAA 15 (L) 12/03/2018 0756   GFRNONAA 35 (L) 06/06/2014 1102   GFRAA 17 (L) 12/03/2018 0756   GFRAA 42 (L) 06/06/2014 1102    No results found for: SPEP, UPEP  Lab Results  Component Value Date   WBC 7.5 12/03/2018   NEUTROABS 5.0 12/03/2018   HGB 10.1 (L) 12/03/2018   HCT 29.6 (L) 12/03/2018   MCV 90.5 12/03/2018   PLT 220  12/03/2018      Chemistry      Component Value Date/Time   NA 134 (L) 12/03/2018 0756   NA 130 (L) 06/06/2014 1102   K 4.1 12/03/2018 0756   K 3.9 06/06/2014 1102   CL 102 12/03/2018 0756   CL 95 (L) 06/06/2014 1102   CO2 22 12/03/2018 0756   CO2 28 06/06/2014 1102   BUN 38 (H) 12/03/2018 0756   BUN 17 06/06/2014 1102   CREATININE 3.26 (H) 12/03/2018 0756   CREATININE 1.63 (H) 06/06/2014 1102      Component Value Date/Time   CALCIUM 8.5 (L) 12/03/2018 0756   CALCIUM 9.2 06/06/2014 1102   ALKPHOS 75 12/03/2018 0756   ALKPHOS 74 06/06/2014 1102   AST 16 12/03/2018 0756   AST 7 (L) 06/06/2014 1102   ALT 15 12/03/2018 0756   ALT 12 (L) 06/06/2014 1102  BILITOT 0.5 12/03/2018 0756   BILITOT 0.4 06/06/2014 1102       RADIOGRAPHIC STUDIES: I have personally reviewed the radiological images as listed and agreed with the findings in the report. No results found.   ASSESSMENT & PLAN:  Carcinoma of upper-inner quadrant of left breast in female, estrogen receptor positive ([place]) Left breast cancer- stage IV- ER/PR +, HER-2/neu - on Taxol chemotherapy; September 29, 2018 PET scan-stable bone lesions/left breast mass; decreasing effusions/left lung atelectasis; prominent right pelvic wall lymph node[see discussion below]. Cl STABLE; but rise in Tumor markers.   # Proceed with Taxol every 2 weeks. Labs today reviewed;  acceptable for treatment today.  Will order scan at next visit.  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   # Chronic kidney disease - stage IV-ctreat- 3.26-STABLE.  # Anemia- hemoglobin today-9-10; STABLE. on PO iron.   #  DISPOSITION:  # Treatment today # follow up in 2 weeks /labs/MD-cbc/cmp-ca-27-29; -Taxol-Dr.B   No orders of the defined types were placed in this encounter.  All questions were answered. The patient knows to call the clinic with any problems,  questions or concerns.      Cammie Sickle, MD 12/06/2018 12:33 PM

## 2018-12-04 LAB — CANCER ANTIGEN 27.29: CA 27.29: 85.1 U/mL — ABNORMAL HIGH (ref 0.0–38.6)

## 2018-12-17 ENCOUNTER — Inpatient Hospital Stay: Payer: Medicare HMO | Attending: Internal Medicine

## 2018-12-17 ENCOUNTER — Other Ambulatory Visit: Payer: Self-pay

## 2018-12-17 ENCOUNTER — Inpatient Hospital Stay (HOSPITAL_BASED_OUTPATIENT_CLINIC_OR_DEPARTMENT_OTHER): Payer: Medicare HMO | Admitting: Internal Medicine

## 2018-12-17 ENCOUNTER — Inpatient Hospital Stay: Payer: Medicare HMO

## 2018-12-17 VITALS — BP 155/94 | HR 71 | Temp 96.8°F | Resp 20 | Ht 62.0 in | Wt 207.0 lb

## 2018-12-17 DIAGNOSIS — D649 Anemia, unspecified: Secondary | ICD-10-CM

## 2018-12-17 DIAGNOSIS — N184 Chronic kidney disease, stage 4 (severe): Secondary | ICD-10-CM | POA: Insufficient documentation

## 2018-12-17 DIAGNOSIS — Z79899 Other long term (current) drug therapy: Secondary | ICD-10-CM | POA: Insufficient documentation

## 2018-12-17 DIAGNOSIS — Z803 Family history of malignant neoplasm of breast: Secondary | ICD-10-CM | POA: Insufficient documentation

## 2018-12-17 DIAGNOSIS — R200 Anesthesia of skin: Secondary | ICD-10-CM | POA: Diagnosis not present

## 2018-12-17 DIAGNOSIS — Z8041 Family history of malignant neoplasm of ovary: Secondary | ICD-10-CM | POA: Insufficient documentation

## 2018-12-17 DIAGNOSIS — K861 Other chronic pancreatitis: Secondary | ICD-10-CM | POA: Diagnosis not present

## 2018-12-17 DIAGNOSIS — Z87891 Personal history of nicotine dependence: Secondary | ICD-10-CM | POA: Insufficient documentation

## 2018-12-17 DIAGNOSIS — Z7189 Other specified counseling: Secondary | ICD-10-CM

## 2018-12-17 DIAGNOSIS — C50212 Malignant neoplasm of upper-inner quadrant of left female breast: Secondary | ICD-10-CM | POA: Diagnosis not present

## 2018-12-17 DIAGNOSIS — R110 Nausea: Secondary | ICD-10-CM | POA: Diagnosis not present

## 2018-12-17 DIAGNOSIS — R202 Paresthesia of skin: Secondary | ICD-10-CM | POA: Diagnosis not present

## 2018-12-17 DIAGNOSIS — I509 Heart failure, unspecified: Secondary | ICD-10-CM | POA: Diagnosis not present

## 2018-12-17 DIAGNOSIS — Z170 Estrogen receptor positive status [ER+]: Secondary | ICD-10-CM

## 2018-12-17 DIAGNOSIS — I130 Hypertensive heart and chronic kidney disease with heart failure and stage 1 through stage 4 chronic kidney disease, or unspecified chronic kidney disease: Secondary | ICD-10-CM | POA: Diagnosis not present

## 2018-12-17 DIAGNOSIS — Z8042 Family history of malignant neoplasm of prostate: Secondary | ICD-10-CM | POA: Insufficient documentation

## 2018-12-17 DIAGNOSIS — Z7982 Long term (current) use of aspirin: Secondary | ICD-10-CM

## 2018-12-17 DIAGNOSIS — Z90710 Acquired absence of both cervix and uterus: Secondary | ICD-10-CM

## 2018-12-17 DIAGNOSIS — R5381 Other malaise: Secondary | ICD-10-CM | POA: Diagnosis not present

## 2018-12-17 DIAGNOSIS — Z800 Family history of malignant neoplasm of digestive organs: Secondary | ICD-10-CM | POA: Insufficient documentation

## 2018-12-17 DIAGNOSIS — Z5111 Encounter for antineoplastic chemotherapy: Secondary | ICD-10-CM | POA: Insufficient documentation

## 2018-12-17 DIAGNOSIS — J45909 Unspecified asthma, uncomplicated: Secondary | ICD-10-CM | POA: Diagnosis not present

## 2018-12-17 DIAGNOSIS — E1122 Type 2 diabetes mellitus with diabetic chronic kidney disease: Secondary | ICD-10-CM | POA: Diagnosis not present

## 2018-12-17 DIAGNOSIS — M899 Disorder of bone, unspecified: Secondary | ICD-10-CM | POA: Diagnosis not present

## 2018-12-17 DIAGNOSIS — I69359 Hemiplegia and hemiparesis following cerebral infarction affecting unspecified side: Secondary | ICD-10-CM | POA: Diagnosis not present

## 2018-12-17 DIAGNOSIS — R5383 Other fatigue: Secondary | ICD-10-CM | POA: Diagnosis not present

## 2018-12-17 DIAGNOSIS — R600 Localized edema: Secondary | ICD-10-CM | POA: Diagnosis not present

## 2018-12-17 DIAGNOSIS — Z95828 Presence of other vascular implants and grafts: Secondary | ICD-10-CM

## 2018-12-17 LAB — CBC WITH DIFFERENTIAL/PLATELET
Abs Immature Granulocytes: 0.08 10*3/uL — ABNORMAL HIGH (ref 0.00–0.07)
Basophils Absolute: 0.1 10*3/uL (ref 0.0–0.1)
Basophils Relative: 1 %
Eosinophils Absolute: 0.1 10*3/uL (ref 0.0–0.5)
Eosinophils Relative: 1 %
HCT: 29.8 % — ABNORMAL LOW (ref 36.0–46.0)
Hemoglobin: 10.2 g/dL — ABNORMAL LOW (ref 12.0–15.0)
Immature Granulocytes: 1 %
Lymphocytes Relative: 14 %
Lymphs Abs: 1.2 10*3/uL (ref 0.7–4.0)
MCH: 30.7 pg (ref 26.0–34.0)
MCHC: 34.2 g/dL (ref 30.0–36.0)
MCV: 89.8 fL (ref 80.0–100.0)
Monocytes Absolute: 0.8 10*3/uL (ref 0.1–1.0)
Monocytes Relative: 10 %
Neutro Abs: 6.1 10*3/uL (ref 1.7–7.7)
Neutrophils Relative %: 73 %
Platelets: 288 10*3/uL (ref 150–400)
RBC: 3.32 MIL/uL — ABNORMAL LOW (ref 3.87–5.11)
RDW: 12.4 % (ref 11.5–15.5)
WBC: 8.4 10*3/uL (ref 4.0–10.5)
nRBC: 0 % (ref 0.0–0.2)

## 2018-12-17 LAB — COMPREHENSIVE METABOLIC PANEL
ALT: 14 U/L (ref 0–44)
AST: 14 U/L — ABNORMAL LOW (ref 15–41)
Albumin: 3.8 g/dL (ref 3.5–5.0)
Alkaline Phosphatase: 75 U/L (ref 38–126)
Anion gap: 6 (ref 5–15)
BUN: 44 mg/dL — ABNORMAL HIGH (ref 8–23)
CO2: 26 mmol/L (ref 22–32)
Calcium: 8.8 mg/dL — ABNORMAL LOW (ref 8.9–10.3)
Chloride: 103 mmol/L (ref 98–111)
Creatinine, Ser: 3.06 mg/dL — ABNORMAL HIGH (ref 0.44–1.00)
GFR calc Af Amer: 18 mL/min — ABNORMAL LOW (ref >60)
GFR calc non Af Amer: 16 mL/min — ABNORMAL LOW (ref >60)
Glucose, Bld: 82 mg/dL (ref 70–99)
Potassium: 4.3 mmol/L (ref 3.5–5.1)
Sodium: 135 mmol/L (ref 135–145)
Total Bilirubin: 0.5 mg/dL (ref 0.3–1.2)
Total Protein: 7.4 g/dL (ref 6.5–8.1)

## 2018-12-17 MED ORDER — DEXAMETHASONE SODIUM PHOSPHATE 10 MG/ML IJ SOLN
8.0000 mg | Freq: Once | INTRAMUSCULAR | Status: AC
  Administered 2018-12-17: 8 mg via INTRAVENOUS
  Filled 2018-12-17: qty 1

## 2018-12-17 MED ORDER — SODIUM CHLORIDE 0.9% FLUSH
10.0000 mL | Freq: Once | INTRAVENOUS | Status: AC
  Administered 2018-12-17: 08:00:00 10 mL via INTRAVENOUS
  Filled 2018-12-17: qty 10

## 2018-12-17 MED ORDER — FAMOTIDINE IN NACL 20-0.9 MG/50ML-% IV SOLN
20.0000 mg | Freq: Once | INTRAVENOUS | Status: AC
  Administered 2018-12-17: 20 mg via INTRAVENOUS
  Filled 2018-12-17: qty 50

## 2018-12-17 MED ORDER — HEPARIN SOD (PORK) LOCK FLUSH 100 UNIT/ML IV SOLN
500.0000 [IU] | Freq: Once | INTRAVENOUS | Status: AC | PRN
  Administered 2018-12-17: 500 [IU]
  Filled 2018-12-17: qty 5

## 2018-12-17 MED ORDER — SODIUM CHLORIDE 0.9 % IV SOLN
Freq: Once | INTRAVENOUS | Status: AC
  Administered 2018-12-17: 10:00:00 via INTRAVENOUS
  Filled 2018-12-17: qty 250

## 2018-12-17 MED ORDER — SODIUM CHLORIDE 0.9 % IV SOLN
60.0000 mg/m2 | Freq: Once | INTRAVENOUS | Status: AC
  Administered 2018-12-17: 114 mg via INTRAVENOUS
  Filled 2018-12-17: qty 19

## 2018-12-17 MED ORDER — DIPHENHYDRAMINE HCL 50 MG/ML IJ SOLN
50.0000 mg | Freq: Once | INTRAMUSCULAR | Status: AC
  Administered 2018-12-17: 50 mg via INTRAVENOUS
  Filled 2018-12-17: qty 1

## 2018-12-17 NOTE — Progress Notes (Signed)
[facility] OFFICE PROGRESS NOTE  Patient Care Team: [name], [name], [name]; [name]; [name]; [name]; EBUS- Positive Paratrac/subcarinal LN s/p ? Taxotere [in [place]; [name]; [name];[name]; [name]; suv 3]; [name]; [name]; [name];  [name]; Ant R iliac bone; [name]; [name]; START-Taxol weekly. March 2020- Taxol every 2 weeks [PN]  # Poorly controlled Blood sugars- improved.   # Pancreatitis Hx/PEI- on creon in past / CKD IV [creat ~ 3-4; [name]; [name]; mild left sided weakness]  # Jan 2020-  Lobular lesion on tongue- s/p excision pyogenic granuloma [Dr.McQueen]   # GENETIC TESTING/COUNSELLING: HETEROZYGOUS Cystic Fibrosis Gene [explains hx of recurrent pancreatitis]  # MOLECULAR TESTING: NA  ------------------------------------------------   DIAGNOSIS: [ 2015] BREAST CA; [name]; [name] ;GOALS: Palliative  CURRENT/MOST RECENT THERAPY [ April 2019] TAXOL      Carcinoma of upper-inner quadrant of left breast in female, estrogen receptor positive ([place])      INTERVAL HISTORY:  [name] 63 y.o.  female pleasant patient above history of ER PR positive HER-2 negative breast cancer on Taxol is here for  follow-up.  Patient has mild tingling and numbness in extremities.  Not any worse.  She continues with Neurontin.  Denies any headaches.  Denies any nausea vomiting.  Mild fatigue.  Mild swelling in the legs.   Review of Systems  Constitutional: Positive for malaise/fatigue. Negative for chills, diaphoresis, [name], hemoptysis, sputum production, shortness of breath and wheezing.   Cardiovascular: Positive for leg swelling. Negative for chest pain, palpitations and orthopnea.  Gastrointestinal: Negative for abdominal pain, blood in stool, constipation, diarrhea, heartburn, melena, nausea and vomiting.  Genitourinary: Negative for dysuria, frequency and urgency.  Musculoskeletal: Negative for back pain and joint pain.  Skin: Negative.  Negative for itching and rash.  Neurological: Positive for tingling. Negative for dizziness, focal weakness, weakness and headaches.  Endo/Heme/Allergies: Does not bruise/bleed easily.  Psychiatric/Behavioral: Negative for depression. The patient is not nervous/anxious and does not have insomnia.       PAST MEDICAL HISTORY :  Past Medical History:  Diagnosis Date  . Anemia   . Asthma   . Cancer ([place]) 03/10/2018   Per NM PET order. Carcinoma of upper-inner quadrant of left breast in female, estrogen receptor positive .  Marland Kitchen CHF (congestive heart failure) ([place]) 1997  . CKD (chronic kidney disease)   . Depression   . Diabetes mellitus, type 2 ([place])   . Family history of breast cancer   . Family history of colon cancer   . Family history of ovarian cancer   . Family history of pancreatic cancer   . Family history of  prostate cancer   . Family history of stomach cancer   . Hair loss   . History of left breast cancer 05/29/14  . History of partial hysterectomy 12/31/2016   Per patient.  Has not had a period in years.   Had a partial hysterectomy years ago.  Marland Kitchen Hypertension   . Obesity   . Pancreatitis 1997  . Stroke [facility]) 2010   with mild left arm weakness    PAST SURGICAL HISTORY :   Past Surgical History:  Procedure Laterality Date  . CESAREAN SECTION    . CHOLECYSTECTOMY    . EXCISION OF TONGUE LESION N/A 08/17/2018   Procedure: EXCISION OF TONGUE LESION WITH FROZEN SECTIONS;  Surgeon: Beverly Gust, MD;  Location: [facility] ORS;  Service: ENT;  Laterality: N/A;  . PARTIAL HYSTERECTOMY  12/31/2016   Per patient, she has not had a period in years since she had a partial hysterectomy.    FAMILY HISTORY :   Family History  Problem Relation Age of Onset  . Ovarian cancer Mother 5  . Diabetes Mother   . Hypertension Mother   . COPD Father   . Hypertension Father   . Colon cancer Father 92  . Diabetes Sister   . Breast cancer Sister 96       bilateral  . Diabetes Brother   . Leukemia Maternal Aunt   . Pancreatic cancer Paternal Aunt 58  . Pancreatic cancer Paternal Uncle   . Colon cancer Paternal Uncle   . Stomach cancer Maternal Grandfather 75  . Throat cancer Paternal Grandmother   . Breast cancer Maternal Aunt 80  . Colon cancer Maternal Aunt   . Bone cancer Maternal Aunt   . Breast cancer Paternal Aunt        dx >50  . Prostate cancer Paternal Uncle   . Pancreatic cancer Paternal Uncle   . Throat cancer Paternal Uncle   . Lung cancer Paternal Uncle   . Stomach cancer Paternal Uncle   . Brain cancer Paternal Aunt   . Cancer Cousin        liver, kidney  . Prostate cancer Cousin        meastatic  . Lung cancer Other     SOCIAL HISTORY:   Social History   Tobacco Use  . Smoking status: Former Smoker    Packs/day: 0.50    Years: 1.00    Pack years: 0.50    Types: Cigarettes  . Smokeless tobacco: Never Used  Substance Use Topics  . Alcohol use: No    Alcohol/week: 0.0 standard drinks  . Drug use: No    ALLERGIES:  has No Known Allergies.  MEDICATIONS:  Current  Outpatient Medications  Medication Sig Dispense Refill  . acetaminophen-codeine (TYLENOL #4) 300-60 MG tablet Take 2 tablets by mouth every 6 (six) hours as needed for moderate pain.     Marland Kitchen albuterol (PROAIR HFA) 108 (90 BASE) MCG/ACT inhaler Inhale 2 puffs into the lungs every 6 (six) hours as needed for wheezing or shortness of breath.     Marland Kitchen albuterol (PROVENTIL) (2.5 MG/3ML) 0.083% nebulizer solution Inhale 2.5 mg into the lungs every 6 (six) hours as needed for shortness of breath.     . ALPRAZolam (XANAX) 0.5 MG tablet Take 0.5 mg by mouth at bedtime as needed for anxiety or sleep.     Marland Kitchen amLODipine (NORVASC) 10 MG tablet Take 10 mg by mouth daily.     Marland Kitchen aspirin EC 81 MG tablet Take  81 mg by mouth daily.     Marland Kitchen atenolol (TENORMIN) 100 MG tablet Take 100 mg by mouth 2 (two) times daily.     . B-D ULTRA-FINE 33 LANCETS MISC Use 1 each 2 (two) times daily.    . B-D ULTRAFINE III SHORT PEN 31G X 8 MM MISC     . bumetanide (BUMEX) 0.5 MG tablet Take 0.5 mg by mouth 2 (two) times daily.     . calcitRIOL (ROCALTROL) 0.25 MCG capsule Take 0.25 mcg by mouth every Monday, Wednesday, and Friday.     . Cinnamon 500 MG capsule Take 1,000 mg by mouth daily.     . cloNIDine (CATAPRES) 0.2 MG tablet Take 0.2 mg by mouth 2 (two) times daily.     . enalapril (VASOTEC) 20 MG tablet Take 20 mg by mouth 2 (two) times daily.     . famotidine (PEPCID) 20 MG tablet Take 20 mg by mouth 2 (two) times daily.    . ferrous sulfate 325 (65 FE) MG tablet Take 325 mg by mouth 2 (two) times daily with a meal.    . FLUoxetine (PROZAC) 20 MG capsule Take 20 mg by mouth 2 (two) times daily.     . Fluticasone Propionate, Inhal, (FLOVENT DISKUS) 100 MCG/BLIST AEPB Inhale into the lungs. Inhale 2 inhalations into the lungs 2 times daily PRN    . gabapentin (NEURONTIN) 100 MG capsule Take 1 capsule (100 mg total) by mouth at bedtime. 30 capsule 2  . glucose blood (ONE TOUCH ULTRA TEST) test strip Use 1 each 2 (two) times daily. Use  as instructed.    . glyBURIDE (DIABETA) 5 MG tablet Take 10 mg by mouth 2 (two) times daily with a meal.     . LEVEMIR FLEXTOUCH 100 UNIT/ML Pen Inject 55 Units into the skin daily.     Marland Kitchen loperamide (IMODIUM A-D) 2 MG capsule Take 2 mg by mouth as needed for diarrhea or loose stools.    . mometasone (NASONEX) 50 MCG/ACT nasal spray Place 2 sprays into the nose daily as needed (Allergies).     . NOVOLOG FLEXPEN 100 UNIT/ML FlexPen Inject 7 Units into the skin 2 (two) times daily.     . ondansetron (ZOFRAN) 8 MG tablet One pill every 8 hours as needed for nausea/vomitting. 40 tablet 1  . prochlorperazine (COMPAZINE) 10 MG tablet Take 1 tablet (10 mg total) by mouth every 6 (six) hours as needed for nausea or vomiting. Please note change in strength 60 tablet 4  . salmeterol (SEREVENT) 50 MCG/DOSE diskus inhaler Inhale 1 puff into the lungs daily as needed (shortness of breath).     . simvastatin (ZOCOR) 20 MG tablet Take 20 mg by mouth daily at 6 PM.     . vitamin B-12 (CYANOCOBALAMIN) 1000 MCG tablet Take 1,000 mcg by mouth daily.     No current facility-administered medications for this visit.    Facility-Administered Medications Ordered in Other Visits  Medication Dose Route Frequency Provider Last Rate Last Dose  . sodium chloride flush (NS) 0.9 % injection 10 mL  10 mL Intravenous PRN Cammie Sickle, MD   10 mL at 01/30/16 1054    PHYSICAL EXAMINATION: ECOG PERFORMANCE STATUS: 1 - Symptomatic but completely ambulatory  BP (!) 155/94 (BP Location: Right Arm, Patient Position: Sitting, Cuff Size: Normal)   Pulse 71   Temp (!) 96.8 F (36 C) (Tympanic)   Resp 20   Ht 5' 2" (1.575 m)  Wt 207 lb (93.9 kg)   BMI 37.86 kg/m   Filed Weights   12/17/18 0838  Weight: 207 lb (93.9 kg)    Physical Exam  Constitutional: She is oriented to person, place, and time and well-developed, well-nourished, and in no distress.  She is alone.  HENT:  Head: Normocephalic and atraumatic.   Mouth/Throat: Oropharynx is clear and moist. No oropharyngeal exudate.  Eyes: Pupils are equal, round, and reactive to light.  Neck: Normal range of motion. Neck supple.  Cardiovascular: Normal rate and regular rhythm.  Pulmonary/Chest: Breath sounds normal. No respiratory distress. She has no wheezes.  Abdominal: Soft. Bowel sounds are normal. She exhibits no distension and no mass. There is no abdominal tenderness. There is no rebound and no guarding.  Musculoskeletal: Normal range of motion.        General: No tenderness.  Neurological: She is alert and oriented to person, place, and time.  Skin: Skin is warm.  Psychiatric: Affect normal.       LABORATORY DATA:  I have reviewed the data as listed    Component Value Date/Time   NA 135 12/17/2018 0800   NA 130 (L) 06/06/2014 1102   K 4.3 12/17/2018 0800   K 3.9 06/06/2014 1102   CL 103 12/17/2018 0800   CL 95 (L) 06/06/2014 1102   CO2 26 12/17/2018 0800   CO2 28 06/06/2014 1102   GLUCOSE 82 12/17/2018 0800   GLUCOSE 349 (H) 06/06/2014 1102   BUN 44 (H) 12/17/2018 0800   BUN 17 06/06/2014 1102   CREATININE 3.06 (H) 12/17/2018 0800   CREATININE 1.63 (H) 06/06/2014 1102   CALCIUM 8.8 (L) 12/17/2018 0800   CALCIUM 9.2 06/06/2014 1102   PROT 7.4 12/17/2018 0800   PROT 8.2 06/06/2014 1102   ALBUMIN 3.8 12/17/2018 0800   ALBUMIN 3.3 (L) 06/06/2014 1102   AST 14 (L) 12/17/2018 0800   AST 7 (L) 06/06/2014 1102   ALT 14 12/17/2018 0800   ALT 12 (L) 06/06/2014 1102   ALKPHOS 75 12/17/2018 0800   ALKPHOS 74 06/06/2014 1102   BILITOT 0.5 12/17/2018 0800   BILITOT 0.4 06/06/2014 1102   GFRNONAA 16 (L) 12/17/2018 0800   GFRNONAA 35 (L) 06/06/2014 1102   GFRAA 18 (L) 12/17/2018 0800   GFRAA 42 (L) 06/06/2014 1102    No results found for: SPEP, UPEP  Lab Results  Component Value Date   WBC 8.4 12/17/2018   NEUTROABS 6.1 12/17/2018   HGB 10.2 (L) 12/17/2018   HCT 29.8 (L) 12/17/2018   MCV 89.8 12/17/2018   PLT 288  12/17/2018      Chemistry      Component Value Date/Time   NA 135 12/17/2018 0800   NA 130 (L) 06/06/2014 1102   K 4.3 12/17/2018 0800   K 3.9 06/06/2014 1102   CL 103 12/17/2018 0800   CL 95 (L) 06/06/2014 1102   CO2 26 12/17/2018 0800   CO2 28 06/06/2014 1102   BUN 44 (H) 12/17/2018 0800   BUN 17 06/06/2014 1102   CREATININE 3.06 (H) 12/17/2018 0800   CREATININE 1.63 (H) 06/06/2014 1102      Component Value Date/Time   CALCIUM 8.8 (L) 12/17/2018 0800   CALCIUM 9.2 06/06/2014 1102   ALKPHOS 75 12/17/2018 0800   ALKPHOS 74 06/06/2014 1102   AST 14 (L) 12/17/2018 0800   AST 7 (L) 06/06/2014 1102   ALT 14 12/17/2018 0800   ALT 12 (L) 06/06/2014 1102   BILITOT  0.5 12/17/2018 0800   BILITOT 0.4 06/06/2014 1102       RADIOGRAPHIC STUDIES: I have personally reviewed the radiological images as listed and agreed with the findings in the report. No results found.   ASSESSMENT & PLAN:  Carcinoma of upper-inner quadrant of left breast in female, estrogen receptor positive ([place]) Left breast cancer- stage IV- ER/PR +, HER-2/neu - on Taxol chemotherapy; September 29, 2018 PET scan-stable bone lesions/left breast mass; decreasing effusions/left lung atelectasis; prominent right pelvic wall lymph node[see discussion below].  Clinically stable.  However tumor markers slowly rising.  # Proceed with Taxol every 2 weeks. Labs today reviewed;  acceptable for treatment today.  Will order scan today.  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   #  DISPOSITION:  # Treatment today # follow up in June 2nd/labs/MD-cbc/cmp-ca-27-29; Taxol; PET scan prior- Dr.B   Orders Placed This Encounter  Procedures  . NM PET Image Restag (PS) Skull Base To Thigh    Standing Status:   Future     Standing Expiration Date:   12/17/2019    Order Specific Question:   ** REASON FOR EXAM (FREE TEXT)    Answer:   breast cancer metastatic    Order Specific Question:   If indicated for the ordered procedure, I authorize the administration of a radiopharmaceutical per Radiology protocol    Answer:   Yes    Order Specific Question:   Preferred imaging location?    Answer:   [facility]    Order Specific Question:   Radiology Contrast Protocol - do NOT remove file path    Answer:   [email or web address]   All questions were answered. The patient knows to call the clinic with any problems, questions or concerns.      Cammie Sickle, MD 12/21/2018 12:58 PM

## 2018-12-17 NOTE — Assessment & Plan Note (Addendum)
Left breast cancer- stage IV- ER/PR +, HER-2/neu - on Taxol chemotherapy; September 29, 2018 PET scan-stable bone lesions/left breast mass; decreasing effusions/left lung atelectasis; prominent right pelvic wall lymph node[see discussion below].  Clinically stable.  However tumor markers slowly rising.  # Proceed with Taxol every 2 weeks. Labs today reviewed;  acceptable for treatment today.  Will order scan today.  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   #  DISPOSITION:  # Treatment today # follow up in June 2nd/labs/MD-cbc/cmp-ca-27-29; Taxol; PET scan prior- Dr.B

## 2018-12-18 LAB — CANCER ANTIGEN 27.29: CA 27.29: 86 U/mL — ABNORMAL HIGH (ref 0.0–38.6)

## 2018-12-22 ENCOUNTER — Other Ambulatory Visit: Payer: Self-pay | Admitting: Internal Medicine

## 2018-12-28 ENCOUNTER — Ambulatory Visit
Admission: RE | Admit: 2018-12-28 | Discharge: 2018-12-28 | Disposition: A | Discharge status: Home / Self Care | Payer: Medicare HMO | Source: Ambulatory Visit | Attending: Internal Medicine | Admitting: Internal Medicine

## 2018-12-28 ENCOUNTER — Other Ambulatory Visit: Payer: Self-pay

## 2018-12-28 DIAGNOSIS — E119 Type 2 diabetes mellitus without complications: Secondary | ICD-10-CM | POA: Diagnosis not present

## 2018-12-28 DIAGNOSIS — M899 Disorder of bone, unspecified: Secondary | ICD-10-CM | POA: Diagnosis not present

## 2018-12-28 DIAGNOSIS — C50412 Malignant neoplasm of upper-outer quadrant of left female breast: Secondary | ICD-10-CM | POA: Diagnosis not present

## 2018-12-28 DIAGNOSIS — C50212 Malignant neoplasm of upper-inner quadrant of left female breast: Secondary | ICD-10-CM | POA: Insufficient documentation

## 2018-12-28 DIAGNOSIS — C50912 Malignant neoplasm of unspecified site of left female breast: Secondary | ICD-10-CM | POA: Diagnosis not present

## 2018-12-28 DIAGNOSIS — E1122 Type 2 diabetes mellitus with diabetic chronic kidney disease: Secondary | ICD-10-CM | POA: Diagnosis not present

## 2018-12-28 DIAGNOSIS — N184 Chronic kidney disease, stage 4 (severe): Secondary | ICD-10-CM | POA: Diagnosis not present

## 2018-12-28 DIAGNOSIS — Z794 Long term (current) use of insulin: Secondary | ICD-10-CM | POA: Diagnosis not present

## 2018-12-28 DIAGNOSIS — F3340 Major depressive disorder, recurrent, in remission, unspecified: Secondary | ICD-10-CM | POA: Diagnosis not present

## 2018-12-28 DIAGNOSIS — I340 Nonrheumatic mitral (valve) insufficiency: Secondary | ICD-10-CM | POA: Diagnosis not present

## 2018-12-28 DIAGNOSIS — R8290 Unspecified abnormal findings in urine: Secondary | ICD-10-CM | POA: Diagnosis not present

## 2018-12-28 DIAGNOSIS — I10 Essential (primary) hypertension: Secondary | ICD-10-CM | POA: Diagnosis not present

## 2018-12-28 DIAGNOSIS — Z170 Estrogen receptor positive status [ER+]: Secondary | ICD-10-CM | POA: Diagnosis not present

## 2018-12-28 DIAGNOSIS — C50919 Malignant neoplasm of unspecified site of unspecified female breast: Secondary | ICD-10-CM | POA: Diagnosis not present

## 2018-12-28 DIAGNOSIS — I5022 Chronic systolic (congestive) heart failure: Secondary | ICD-10-CM | POA: Diagnosis not present

## 2018-12-28 DIAGNOSIS — C799 Secondary malignant neoplasm of unspecified site: Secondary | ICD-10-CM | POA: Diagnosis not present

## 2018-12-28 LAB — GLUCOSE, CAPILLARY: Glucose-Capillary: 155 mg/dL — ABNORMAL HIGH (ref 70–99)

## 2018-12-28 MED ORDER — FLUDEOXYGLUCOSE F - 18 (FDG) INJECTION
10.7000 | Freq: Once | INTRAVENOUS | Status: AC | PRN
  Administered 2018-12-28: 08:00:00 11.18 via INTRAVENOUS

## 2019-01-04 ENCOUNTER — Other Ambulatory Visit: Payer: Self-pay

## 2019-01-04 ENCOUNTER — Inpatient Hospital Stay: Payer: Medicare HMO

## 2019-01-04 ENCOUNTER — Inpatient Hospital Stay (HOSPITAL_BASED_OUTPATIENT_CLINIC_OR_DEPARTMENT_OTHER): Payer: Medicare HMO | Admitting: Internal Medicine

## 2019-01-04 ENCOUNTER — Inpatient Hospital Stay: Payer: Medicare HMO | Attending: Internal Medicine

## 2019-01-04 VITALS — BP 165/71 | HR 74 | Temp 96.0°F | Resp 20 | Ht 62.0 in | Wt 206.0 lb

## 2019-01-04 DIAGNOSIS — Z803 Family history of malignant neoplasm of breast: Secondary | ICD-10-CM

## 2019-01-04 DIAGNOSIS — F329 Major depressive disorder, single episode, unspecified: Secondary | ICD-10-CM | POA: Insufficient documentation

## 2019-01-04 DIAGNOSIS — J45909 Unspecified asthma, uncomplicated: Secondary | ICD-10-CM | POA: Diagnosis not present

## 2019-01-04 DIAGNOSIS — Z7951 Long term (current) use of inhaled steroids: Secondary | ICD-10-CM | POA: Diagnosis not present

## 2019-01-04 DIAGNOSIS — Z8041 Family history of malignant neoplasm of ovary: Secondary | ICD-10-CM | POA: Insufficient documentation

## 2019-01-04 DIAGNOSIS — Z87891 Personal history of nicotine dependence: Secondary | ICD-10-CM

## 2019-01-04 DIAGNOSIS — Z800 Family history of malignant neoplasm of digestive organs: Secondary | ICD-10-CM | POA: Diagnosis not present

## 2019-01-04 DIAGNOSIS — Z7982 Long term (current) use of aspirin: Secondary | ICD-10-CM | POA: Insufficient documentation

## 2019-01-04 DIAGNOSIS — Z8042 Family history of malignant neoplasm of prostate: Secondary | ICD-10-CM

## 2019-01-04 DIAGNOSIS — N184 Chronic kidney disease, stage 4 (severe): Secondary | ICD-10-CM | POA: Insufficient documentation

## 2019-01-04 DIAGNOSIS — Z5111 Encounter for antineoplastic chemotherapy: Secondary | ICD-10-CM | POA: Insufficient documentation

## 2019-01-04 DIAGNOSIS — E119 Type 2 diabetes mellitus without complications: Secondary | ICD-10-CM | POA: Diagnosis not present

## 2019-01-04 DIAGNOSIS — Z95828 Presence of other vascular implants and grafts: Secondary | ICD-10-CM

## 2019-01-04 DIAGNOSIS — D649 Anemia, unspecified: Secondary | ICD-10-CM | POA: Diagnosis not present

## 2019-01-04 DIAGNOSIS — Z170 Estrogen receptor positive status [ER+]: Secondary | ICD-10-CM | POA: Diagnosis not present

## 2019-01-04 DIAGNOSIS — Z79899 Other long term (current) drug therapy: Secondary | ICD-10-CM | POA: Diagnosis not present

## 2019-01-04 DIAGNOSIS — I10 Essential (primary) hypertension: Secondary | ICD-10-CM | POA: Diagnosis not present

## 2019-01-04 DIAGNOSIS — Z7189 Other specified counseling: Secondary | ICD-10-CM

## 2019-01-04 DIAGNOSIS — C50212 Malignant neoplasm of upper-inner quadrant of left female breast: Secondary | ICD-10-CM | POA: Diagnosis not present

## 2019-01-04 DIAGNOSIS — Z794 Long term (current) use of insulin: Secondary | ICD-10-CM | POA: Insufficient documentation

## 2019-01-04 LAB — CBC WITH DIFFERENTIAL/PLATELET
Abs Immature Granulocytes: 0.15 10*3/uL — ABNORMAL HIGH (ref 0.00–0.07)
Basophils Absolute: 0 10*3/uL (ref 0.0–0.1)
Basophils Relative: 1 %
Eosinophils Absolute: 0.2 10*3/uL (ref 0.0–0.5)
Eosinophils Relative: 2 %
HCT: 31.1 % — ABNORMAL LOW (ref 36.0–46.0)
Hemoglobin: 10.6 g/dL — ABNORMAL LOW (ref 12.0–15.0)
Immature Granulocytes: 2 %
Lymphocytes Relative: 17 %
Lymphs Abs: 1.5 10*3/uL (ref 0.7–4.0)
MCH: 31 pg (ref 26.0–34.0)
MCHC: 34.1 g/dL (ref 30.0–36.0)
MCV: 90.9 fL (ref 80.0–100.0)
Monocytes Absolute: 1.1 10*3/uL — ABNORMAL HIGH (ref 0.1–1.0)
Monocytes Relative: 13 %
Neutro Abs: 5.5 10*3/uL (ref 1.7–7.7)
Neutrophils Relative %: 65 %
Platelets: 295 10*3/uL (ref 150–400)
RBC: 3.42 MIL/uL — ABNORMAL LOW (ref 3.87–5.11)
RDW: 12.6 % (ref 11.5–15.5)
WBC: 8.4 10*3/uL (ref 4.0–10.5)
nRBC: 0 % (ref 0.0–0.2)

## 2019-01-04 LAB — COMPREHENSIVE METABOLIC PANEL
ALT: 16 U/L (ref 0–44)
AST: 18 U/L (ref 15–41)
Albumin: 4 g/dL (ref 3.5–5.0)
Alkaline Phosphatase: 79 U/L (ref 38–126)
Anion gap: 9 (ref 5–15)
BUN: 43 mg/dL — ABNORMAL HIGH (ref 8–23)
CO2: 23 mmol/L (ref 22–32)
Calcium: 8.3 mg/dL — ABNORMAL LOW (ref 8.9–10.3)
Chloride: 105 mmol/L (ref 98–111)
Creatinine, Ser: 3.48 mg/dL — ABNORMAL HIGH (ref 0.44–1.00)
GFR calc Af Amer: 16 mL/min — ABNORMAL LOW (ref >60)
GFR calc non Af Amer: 13 mL/min — ABNORMAL LOW (ref >60)
Glucose, Bld: 79 mg/dL (ref 70–99)
Potassium: 4.2 mmol/L (ref 3.5–5.1)
Sodium: 137 mmol/L (ref 135–145)
Total Bilirubin: 0.6 mg/dL (ref 0.3–1.2)
Total Protein: 7.5 g/dL (ref 6.5–8.1)

## 2019-01-04 MED ORDER — DIPHENHYDRAMINE HCL 50 MG/ML IJ SOLN
50.0000 mg | Freq: Once | INTRAMUSCULAR | Status: AC
  Administered 2019-01-04: 10:00:00 50 mg via INTRAVENOUS
  Filled 2019-01-04: qty 1

## 2019-01-04 MED ORDER — SODIUM CHLORIDE 0.9 % IV SOLN
60.0000 mg/m2 | Freq: Once | INTRAVENOUS | Status: AC
  Administered 2019-01-04: 114 mg via INTRAVENOUS
  Filled 2019-01-04: qty 19

## 2019-01-04 MED ORDER — DEXAMETHASONE SODIUM PHOSPHATE 10 MG/ML IJ SOLN
8.0000 mg | Freq: Once | INTRAMUSCULAR | Status: AC
  Administered 2019-01-04: 8 mg via INTRAVENOUS
  Filled 2019-01-04: qty 1

## 2019-01-04 MED ORDER — SODIUM CHLORIDE 0.9 % IV SOLN
Freq: Once | INTRAVENOUS | Status: AC
  Administered 2019-01-04: 09:00:00 via INTRAVENOUS
  Filled 2019-01-04: qty 250

## 2019-01-04 MED ORDER — SODIUM CHLORIDE 0.9% FLUSH
10.0000 mL | Freq: Once | INTRAVENOUS | Status: AC
  Administered 2019-01-04: 10 mL via INTRAVENOUS
  Filled 2019-01-04: qty 10

## 2019-01-04 MED ORDER — HEPARIN SOD (PORK) LOCK FLUSH 100 UNIT/ML IV SOLN
500.0000 [IU] | Freq: Once | INTRAVENOUS | Status: AC | PRN
  Administered 2019-01-04: 500 [IU]
  Filled 2019-01-04: qty 5

## 2019-01-04 MED ORDER — FAMOTIDINE IN NACL 20-0.9 MG/50ML-% IV SOLN
20.0000 mg | Freq: Once | INTRAVENOUS | Status: AC
  Administered 2019-01-04: 20 mg via INTRAVENOUS
  Filled 2019-01-04: qty 50

## 2019-01-04 NOTE — Progress Notes (Signed)
[place] OFFICE PROGRESS NOTE  Patient Care Team: [name], [name], [name]; [name]; [name]; [name]; EBUS- Positive Paratrac/subcarinal LN s/p ? Taxotere [in [place]; [name]; [name];[name]; [name]; suv 3]; [name]; [name]; [name];  [name]; Ant R iliac bone; [name]; [name]; START-Taxol weekly. March 2020- Taxol every 2 weeks [PN]  # Poorly controlled Blood sugars- improved.   # Pancreatitis Hx/PEI- on creon in past / CKD IV [creat ~ 3-4; [name]; [name]; mild left sided weakness]  # Jan 2020-  Lobular lesion on tongue- s/p excision pyogenic granuloma [Dr.McQueen]   # GENETIC TESTING/COUNSELLING: HETEROZYGOUS Cystic Fibrosis Gene [explains hx of recurrent pancreatitis]  # MOLECULAR TESTING: NA  ------------------------------------------------   DIAGNOSIS: [ 2015] BREAST CA; [name]; [name] ;GOALS: Palliative  CURRENT/MOST RECENT THERAPY [ April 2019] TAXOL      Carcinoma of upper-inner quadrant of left breast in female, estrogen receptor positive ([place])      INTERVAL HISTORY:  [name] 63 y.o.  female pleasant patient above history of ER PR positive HER-2 negative breast cancer on Taxol is here for  follow-up/review results of the PET scan.  Patient denies any worsening shortness of with cough.  Complains of mild tingling and numbness in extremities.  Not any worse.  She continues to be Neurontin.  Denies any headaches but denies any nausea vomiting.  Mild to moderate fatigue.  Mild swelling in the legs.  Review of Systems  Constitutional: Positive for malaise/fatigue. Negative for chills, diaphoresis, [name], hemoptysis, sputum production, shortness of breath and wheezing.   Cardiovascular: Positive for leg swelling. Negative for chest pain, palpitations and orthopnea.  Gastrointestinal: Negative for abdominal pain, blood in stool, constipation, diarrhea, heartburn, melena, nausea and vomiting.  Genitourinary: Negative for dysuria, frequency and urgency.  Musculoskeletal: Negative for back pain and joint pain.  Skin: Negative.  Negative for itching and rash.  Neurological: Positive for tingling. Negative for dizziness, focal weakness, weakness and headaches.  Endo/Heme/Allergies: Does not bruise/bleed easily.  Psychiatric/Behavioral: Negative for depression. The patient is not nervous/anxious and does not have insomnia.       PAST MEDICAL HISTORY :  Past Medical History:  Diagnosis Date  . Anemia   . Asthma   . Cancer ([place]) 03/10/2018   Per [facility] PET order. Carcinoma of upper-inner quadrant of left breast in female, estrogen receptor positive .  Marland Kitchen CHF (congestive heart failure) ([place]) 1997  . CKD (chronic kidney disease)   . Depression   . Diabetes mellitus, type 2 ([place])   . Family history of breast cancer   . Family history of colon cancer   . Family history of  ovarian cancer   . Family history of pancreatic cancer   . Family history of prostate cancer   . Family history of stomach cancer   . Hair loss   . History of left breast cancer  05/29/14  . History of partial hysterectomy 12/31/2016   Per patient.  Has not had a period in years.  Had a partial hysterectomy years ago.  Marland Kitchen Hypertension   . Obesity   . Pancreatitis 1997  . Stroke [facility]) 2010   with mild left arm weakness    PAST SURGICAL HISTORY :   Past Surgical History:  Procedure Laterality Date  . CESAREAN SECTION    . CHOLECYSTECTOMY    . EXCISION OF TONGUE LESION N/A 08/17/2018   Procedure: EXCISION OF TONGUE LESION WITH FROZEN SECTIONS;  Surgeon: Beverly Gust, MD;  Location: [facility] ORS;  Service: ENT;  Laterality: N/A;  . PARTIAL HYSTERECTOMY  12/31/2016   Per patient, she has not had a period in years since she had a partial hysterectomy.    FAMILY HISTORY :   Family History  Problem Relation Age of Onset  . Ovarian cancer Mother 75  . Diabetes Mother   . Hypertension Mother   . COPD Father   . Hypertension Father   . Colon cancer Father 52  . Diabetes Sister   . Breast cancer Sister 70       bilateral  . Diabetes Brother   . Leukemia Maternal Aunt   . Pancreatic cancer Paternal Aunt 33  . Pancreatic cancer Paternal Uncle   . Colon cancer Paternal Uncle   . Stomach cancer Maternal Grandfather 58  . Throat cancer Paternal Grandmother   . Breast cancer Maternal Aunt 80  . Colon cancer Maternal Aunt   . Bone cancer Maternal Aunt   . Breast cancer Paternal Aunt        dx >50  . Prostate cancer Paternal Uncle   . Pancreatic cancer Paternal Uncle   . Throat cancer Paternal Uncle   . Lung cancer Paternal Uncle   . Stomach cancer Paternal Uncle   . Brain cancer Paternal Aunt   . Cancer Cousin        liver, kidney  . Prostate cancer Cousin        meastatic  . Lung cancer Other     SOCIAL HISTORY:   Social History   Tobacco Use  . Smoking status: Former Smoker    Packs/day: 0.50    Years: 1.00    Pack years: 0.50    Types: Cigarettes  . Smokeless tobacco: Never Used  Substance Use Topics  . Alcohol use: No    Alcohol/week: 0.0  standard drinks  . Drug use: No    ALLERGIES:  has No Known Allergies.  MEDICATIONS:  Current Outpatient Medications  Medication Sig Dispense Refill  . albuterol (PROAIR HFA) 108 (90 BASE) MCG/ACT inhaler Inhale 2 puffs into the lungs every 6 (six) hours as needed for wheezing or shortness of breath.     Marland Kitchen albuterol (PROVENTIL) (2.5 MG/3ML) 0.083% nebulizer solution Inhale 2.5 mg into the lungs every 6 (six) hours as needed for shortness of breath.     . ALPRAZolam (XANAX) 0.5 MG tablet Take 0.5 mg by mouth at bedtime as needed for anxiety or sleep.     Marland Kitchen amLODipine (NORVASC) 10 MG tablet Take 10 mg by mouth daily.     Marland Kitchen amoxicillin (AMOXIL) 875 MG tablet Take 1 tablet by mouth 2 (two) times a day.    Marland Kitchen  aspirin EC 81 MG tablet Take 81 mg by mouth daily.     Marland Kitchen atenolol (TENORMIN) 100 MG tablet Take 100 mg by mouth 2 (two) times daily.     . B-D ULTRA-FINE 33 LANCETS MISC Use 1 each 2 (two) times daily.    . B-D ULTRAFINE III SHORT PEN 31G X 8 MM MISC     . bumetanide (BUMEX) 0.5 MG tablet Take 0.5 mg by mouth 2 (two) times daily.     . calcitRIOL (ROCALTROL) 0.25 MCG capsule Take 0.25 mcg by mouth every Monday, Wednesday, and Friday.     . Cinnamon 500 MG capsule Take 1,000 mg by mouth daily.     . cloNIDine (CATAPRES) 0.2 MG tablet Take 0.2 mg by mouth 2 (two) times daily.     . enalapril (VASOTEC) 20 MG tablet Take 20 mg by mouth 2 (two) times daily.     . famotidine (PEPCID) 20 MG tablet Take 20 mg by mouth 2 (two) times daily.    . ferrous sulfate 325 (65 FE) MG tablet Take 325 mg by mouth 2 (two) times daily with a meal.    . FLUoxetine (PROZAC) 20 MG capsule Take 20 mg by mouth 2 (two) times daily.     . Fluticasone Propionate, Inhal, (FLOVENT DISKUS) 100 MCG/BLIST AEPB Inhale into the lungs. Inhale 2 inhalations into the lungs 2 times daily PRN    . gabapentin (NEURONTIN) 100 MG capsule TAKE 1 CAPSULE(100 MG) BY MOUTH AT BEDTIME 30 capsule 6  . glucose blood (ONE TOUCH ULTRA TEST)  test strip Use 1 each 2 (two) times daily. Use as instructed.    . glyBURIDE (DIABETA) 5 MG tablet Take 10 mg by mouth 2 (two) times daily with a meal.     . LEVEMIR FLEXTOUCH 100 UNIT/ML Pen Inject 55 Units into the skin daily.     . mometasone (NASONEX) 50 MCG/ACT nasal spray Place 2 sprays into the nose daily as needed (Allergies).     . NOVOLOG FLEXPEN 100 UNIT/ML FlexPen Inject 7 Units into the skin 2 (two) times daily.     . salmeterol (SEREVENT) 50 MCG/DOSE diskus inhaler Inhale 1 puff into the lungs daily as needed (shortness of breath).     . simvastatin (ZOCOR) 20 MG tablet Take 20 mg by mouth daily at 6 PM.     . vitamin B-12 (CYANOCOBALAMIN) 1000 MCG tablet Take 1,000 mcg by mouth daily.    Marland Kitchen acetaminophen-codeine (TYLENOL #4) 300-60 MG tablet Take 2 tablets by mouth every 6 (six) hours as needed for moderate pain.     Marland Kitchen loperamide (IMODIUM A-D) 2 MG capsule Take 2 mg by mouth as needed for diarrhea or loose stools.    . ondansetron (ZOFRAN) 8 MG tablet One pill every 8 hours as needed for nausea/vomitting. (Patient not taking: Reported on 01/04/2019) 40 tablet 1  . prochlorperazine (COMPAZINE) 10 MG tablet Take 1 tablet (10 mg total) by mouth every 6 (six) hours as needed for nausea or vomiting. Please note change in strength (Patient not taking: Reported on 01/04/2019) 60 tablet 4   No current facility-administered medications for this visit.    Facility-Administered Medications Ordered in Other Visits  Medication Dose Route Frequency Provider Last Rate Last Dose  . diphenhydrAMINE (BENADRYL) injection 50 mg  50 mg Intravenous Once Charlaine Dalton R, MD      . famotidine (PEPCID) IVPB 20 mg premix  20 mg Intravenous Once Cammie Sickle, MD      .  heparin lock flush 100 unit/mL  500 Units Intracatheter Once PRN Cammie Sickle, MD      . PACLitaxel (TAXOL) 114 mg in sodium chloride 0.9 % 250 mL chemo infusion (</= 57m/m2)  60 mg/m2 (Treatment Plan Recorded) Intravenous  Once BCharlaine DaltonR, MD      . sodium chloride flush (NS) 0.9 % injection 10 mL  10 mL Intravenous PRN BCammie Sickle MD   10 mL at 01/30/16 1054    PHYSICAL EXAMINATION: ECOG PERFORMANCE STATUS: 1 - Symptomatic but completely ambulatory  BP (!) 165/71 (BP Location: Left Arm, Patient Position: Sitting, Cuff Size: Normal)   Pulse 74   Temp (!) 96 F (35.6 C) (Tympanic)   Resp 20   Ht 5' 2"  (1.575 m)   Wt 206 lb (93.4 kg)   BMI 37.68 kg/m   Filed Weights   01/04/19 0829  Weight: 206 lb (93.4 kg)    Physical Exam  Constitutional: She is oriented to person, place, and time and well-developed, well-nourished, and in no distress.  She is alone.  HENT:  Head: Normocephalic and atraumatic.  Mouth/Throat: Oropharynx is clear and moist. No oropharyngeal exudate.  Eyes: Pupils are equal, round, and reactive to light.  Neck: Normal range of motion. Neck supple.  Cardiovascular: Normal rate and regular rhythm.  Pulmonary/Chest: Breath sounds normal. No respiratory distress. She has no wheezes.  Abdominal: Soft. Bowel sounds are normal. She exhibits no distension and no mass. There is no abdominal tenderness. There is no rebound and no guarding.  Musculoskeletal: Normal range of motion.        General: No tenderness.  Neurological: She is alert and oriented to person, place, and time.  Skin: Skin is warm.  Psychiatric: Affect normal.       LABORATORY DATA:  I have reviewed the data as listed    Component Value Date/Time   NA 137 01/04/2019 0802   NA 130 (L) 06/06/2014 1102   K 4.2 01/04/2019 0802   K 3.9 06/06/2014 1102   CL 105 01/04/2019 0802   CL 95 (L) 06/06/2014 1102   CO2 23 01/04/2019 0802   CO2 28 06/06/2014 1102   GLUCOSE 79 01/04/2019 0802   GLUCOSE 349 (H) 06/06/2014 1102   BUN 43 (H) 01/04/2019 0802   BUN 17 06/06/2014 1102   CREATININE 3.48 (H) 01/04/2019 0802   CREATININE 1.63 (H) 06/06/2014 1102   CALCIUM 8.3 (L) 01/04/2019 0802   CALCIUM  9.2 06/06/2014 1102   PROT 7.5 01/04/2019 0802   PROT 8.2 06/06/2014 1102   ALBUMIN 4.0 01/04/2019 0802   ALBUMIN 3.3 (L) 06/06/2014 1102   AST 18 01/04/2019 0802   AST 7 (L) 06/06/2014 1102   ALT 16 01/04/2019 0802   ALT 12 (L) 06/06/2014 1102   ALKPHOS 79 01/04/2019 0802   ALKPHOS 74 06/06/2014 1102   BILITOT 0.6 01/04/2019 0802   BILITOT 0.4 06/06/2014 1102   GFRNONAA 13 (L) 01/04/2019 0802   GFRNONAA 35 (L) 06/06/2014 1102   GFRAA 16 (L) 01/04/2019 0802   GFRAA 42 (L) 06/06/2014 1102    No results found for: SPEP, UPEP  Lab Results  Component Value Date   WBC 8.4 01/04/2019   NEUTROABS 5.5 01/04/2019   HGB 10.6 (L) 01/04/2019   HCT 31.1 (L) 01/04/2019   MCV 90.9 01/04/2019   PLT 295 01/04/2019      Chemistry      Component Value Date/Time   NA 137 01/04/2019 0802  NA 130 (L) 06/06/2014 1102   K 4.2 01/04/2019 0802   K 3.9 06/06/2014 1102   CL 105 01/04/2019 0802   CL 95 (L) 06/06/2014 1102   CO2 23 01/04/2019 0802   CO2 28 06/06/2014 1102   BUN 43 (H) 01/04/2019 0802   BUN 17 06/06/2014 1102   CREATININE 3.48 (H) 01/04/2019 0802   CREATININE 1.63 (H) 06/06/2014 1102      Component Value Date/Time   CALCIUM 8.3 (L) 01/04/2019 0802   CALCIUM 9.2 06/06/2014 1102   ALKPHOS 79 01/04/2019 0802   ALKPHOS 74 06/06/2014 1102   AST 18 01/04/2019 0802   AST 7 (L) 06/06/2014 1102   ALT 16 01/04/2019 0802   ALT 12 (L) 06/06/2014 1102   BILITOT 0.6 01/04/2019 0802   BILITOT 0.4 06/06/2014 1102       RADIOGRAPHIC STUDIES: I have personally reviewed the radiological images as listed and agreed with the findings in the report. No results found.   ASSESSMENT & PLAN:  Carcinoma of upper-inner quadrant of left breast in female, estrogen receptor positive ([place]) Left breast cancer- stage IV- ER/PR +, HER-2/neu - on Taxol chemotherapy; PET May 31st 2020- stable bone lesions/left breast mass; decreasing effusions/left lung atelectasis; right pelvic LN- improved.   Clinically, stable.  However tumor markers slowly rising.  # Proceed with Taxol every 2 weeks. Labs today reviewed;  acceptable for treatment today.    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   # Chronic kidney disease - stage IV-creat-3.48- stable.   # Anemia- hemoglobin today-9-10; stable.   #  DISPOSITION:  # Treatment today # follow up in June 19th /labs/MD-cbc/cmp-ca-27-29; Taxol;- Dr.B   No orders of the defined types were placed in this encounter.  All questions were answered. The patient knows to call the clinic with any problems, questions or concerns.      Cammie Sickle, MD 01/04/2019 9:30 AM

## 2019-01-04 NOTE — Assessment & Plan Note (Addendum)
Left breast cancer- stage IV- ER/PR +, HER-2/neu - on Taxol chemotherapy; PET May 31st 2020- stable bone lesions/left breast mass; decreasing effusions/left lung atelectasis; right pelvic LN- improved.  Clinically, stable.  However tumor markers slowly rising.  # Proceed with Taxol every 2 weeks. Labs today reviewed;  acceptable for treatment today.    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   # Chronic kidney disease - stage IV-creat-3.48- stable.   # Anemia- hemoglobin today-9-10; stable.   #  DISPOSITION:  # Treatment today # follow up in June 19th /labs/MD-cbc/cmp-ca-27-29; Taxol;- Dr.B 

## 2019-01-05 LAB — CANCER ANTIGEN 27.29: CA 27.29: 95.1 U/mL — ABNORMAL HIGH (ref 0.0–38.6)

## 2019-01-12 DIAGNOSIS — I5022 Chronic systolic (congestive) heart failure: Secondary | ICD-10-CM | POA: Diagnosis not present

## 2019-01-12 DIAGNOSIS — F3340 Major depressive disorder, recurrent, in remission, unspecified: Secondary | ICD-10-CM | POA: Diagnosis not present

## 2019-01-12 DIAGNOSIS — E119 Type 2 diabetes mellitus without complications: Secondary | ICD-10-CM | POA: Diagnosis not present

## 2019-01-12 DIAGNOSIS — I340 Nonrheumatic mitral (valve) insufficiency: Secondary | ICD-10-CM | POA: Diagnosis not present

## 2019-01-12 DIAGNOSIS — C50412 Malignant neoplasm of upper-outer quadrant of left female breast: Secondary | ICD-10-CM | POA: Diagnosis not present

## 2019-01-12 DIAGNOSIS — I10 Essential (primary) hypertension: Secondary | ICD-10-CM | POA: Diagnosis not present

## 2019-01-12 DIAGNOSIS — Z0000 Encounter for general adult medical examination without abnormal findings: Secondary | ICD-10-CM | POA: Diagnosis not present

## 2019-01-12 DIAGNOSIS — J4520 Mild intermittent asthma, uncomplicated: Secondary | ICD-10-CM | POA: Diagnosis not present

## 2019-01-12 DIAGNOSIS — E1122 Type 2 diabetes mellitus with diabetic chronic kidney disease: Secondary | ICD-10-CM | POA: Diagnosis not present

## 2019-01-20 ENCOUNTER — Other Ambulatory Visit: Payer: Self-pay

## 2019-01-21 ENCOUNTER — Inpatient Hospital Stay: Payer: Medicare HMO

## 2019-01-21 ENCOUNTER — Other Ambulatory Visit: Payer: Self-pay

## 2019-01-21 ENCOUNTER — Encounter: Payer: Self-pay | Admitting: Internal Medicine

## 2019-01-21 ENCOUNTER — Inpatient Hospital Stay (HOSPITAL_BASED_OUTPATIENT_CLINIC_OR_DEPARTMENT_OTHER): Payer: Medicare HMO | Admitting: Internal Medicine

## 2019-01-21 DIAGNOSIS — E119 Type 2 diabetes mellitus without complications: Secondary | ICD-10-CM | POA: Diagnosis not present

## 2019-01-21 DIAGNOSIS — Z794 Long term (current) use of insulin: Secondary | ICD-10-CM

## 2019-01-21 DIAGNOSIS — Z7982 Long term (current) use of aspirin: Secondary | ICD-10-CM

## 2019-01-21 DIAGNOSIS — Z95828 Presence of other vascular implants and grafts: Secondary | ICD-10-CM

## 2019-01-21 DIAGNOSIS — C50212 Malignant neoplasm of upper-inner quadrant of left female breast: Secondary | ICD-10-CM

## 2019-01-21 DIAGNOSIS — Z7951 Long term (current) use of inhaled steroids: Secondary | ICD-10-CM

## 2019-01-21 DIAGNOSIS — Z803 Family history of malignant neoplasm of breast: Secondary | ICD-10-CM

## 2019-01-21 DIAGNOSIS — N184 Chronic kidney disease, stage 4 (severe): Secondary | ICD-10-CM | POA: Diagnosis not present

## 2019-01-21 DIAGNOSIS — Z7189 Other specified counseling: Secondary | ICD-10-CM

## 2019-01-21 DIAGNOSIS — Z800 Family history of malignant neoplasm of digestive organs: Secondary | ICD-10-CM

## 2019-01-21 DIAGNOSIS — D649 Anemia, unspecified: Secondary | ICD-10-CM | POA: Diagnosis not present

## 2019-01-21 DIAGNOSIS — J45909 Unspecified asthma, uncomplicated: Secondary | ICD-10-CM

## 2019-01-21 DIAGNOSIS — Z170 Estrogen receptor positive status [ER+]: Secondary | ICD-10-CM | POA: Diagnosis not present

## 2019-01-21 DIAGNOSIS — Z8041 Family history of malignant neoplasm of ovary: Secondary | ICD-10-CM

## 2019-01-21 DIAGNOSIS — Z87891 Personal history of nicotine dependence: Secondary | ICD-10-CM

## 2019-01-21 DIAGNOSIS — F329 Major depressive disorder, single episode, unspecified: Secondary | ICD-10-CM

## 2019-01-21 DIAGNOSIS — I10 Essential (primary) hypertension: Secondary | ICD-10-CM | POA: Diagnosis not present

## 2019-01-21 DIAGNOSIS — Z8042 Family history of malignant neoplasm of prostate: Secondary | ICD-10-CM

## 2019-01-21 DIAGNOSIS — Z79899 Other long term (current) drug therapy: Secondary | ICD-10-CM

## 2019-01-21 DIAGNOSIS — Z5111 Encounter for antineoplastic chemotherapy: Secondary | ICD-10-CM | POA: Diagnosis not present

## 2019-01-21 LAB — COMPREHENSIVE METABOLIC PANEL
ALT: 15 U/L (ref 0–44)
AST: 17 U/L (ref 15–41)
Albumin: 3.8 g/dL (ref 3.5–5.0)
Alkaline Phosphatase: 68 U/L (ref 38–126)
Anion gap: 8 (ref 5–15)
BUN: 41 mg/dL — ABNORMAL HIGH (ref 8–23)
CO2: 24 mmol/L (ref 22–32)
Calcium: 8.5 mg/dL — ABNORMAL LOW (ref 8.9–10.3)
Chloride: 103 mmol/L (ref 98–111)
Creatinine, Ser: 3.35 mg/dL — ABNORMAL HIGH (ref 0.44–1.00)
GFR calc Af Amer: 16 mL/min — ABNORMAL LOW (ref >60)
GFR calc non Af Amer: 14 mL/min — ABNORMAL LOW (ref >60)
Glucose, Bld: 130 mg/dL — ABNORMAL HIGH (ref 70–99)
Potassium: 4.1 mmol/L (ref 3.5–5.1)
Sodium: 135 mmol/L (ref 135–145)
Total Bilirubin: 0.5 mg/dL (ref 0.3–1.2)
Total Protein: 7.1 g/dL (ref 6.5–8.1)

## 2019-01-21 LAB — CBC WITH DIFFERENTIAL/PLATELET
Abs Immature Granulocytes: 0.08 10*3/uL — ABNORMAL HIGH (ref 0.00–0.07)
Basophils Absolute: 0 10*3/uL (ref 0.0–0.1)
Basophils Relative: 1 %
Eosinophils Absolute: 0.1 10*3/uL (ref 0.0–0.5)
Eosinophils Relative: 2 %
HCT: 29.7 % — ABNORMAL LOW (ref 36.0–46.0)
Hemoglobin: 10 g/dL — ABNORMAL LOW (ref 12.0–15.0)
Immature Granulocytes: 1 %
Lymphocytes Relative: 14 %
Lymphs Abs: 1 10*3/uL (ref 0.7–4.0)
MCH: 30.6 pg (ref 26.0–34.0)
MCHC: 33.7 g/dL (ref 30.0–36.0)
MCV: 90.8 fL (ref 80.0–100.0)
Monocytes Absolute: 0.9 10*3/uL (ref 0.1–1.0)
Monocytes Relative: 12 %
Neutro Abs: 5.2 10*3/uL (ref 1.7–7.7)
Neutrophils Relative %: 70 %
Platelets: 246 10*3/uL (ref 150–400)
RBC: 3.27 MIL/uL — ABNORMAL LOW (ref 3.87–5.11)
RDW: 12.9 % (ref 11.5–15.5)
WBC: 7.4 10*3/uL (ref 4.0–10.5)
nRBC: 0 % (ref 0.0–0.2)

## 2019-01-21 MED ORDER — FAMOTIDINE IN NACL 20-0.9 MG/50ML-% IV SOLN
20.0000 mg | Freq: Once | INTRAVENOUS | Status: AC
  Administered 2019-01-21: 10:00:00 20 mg via INTRAVENOUS
  Filled 2019-01-21: qty 50

## 2019-01-21 MED ORDER — SODIUM CHLORIDE 0.9 % IV SOLN
Freq: Once | INTRAVENOUS | Status: AC
  Administered 2019-01-21: 10:00:00 via INTRAVENOUS
  Filled 2019-01-21: qty 250

## 2019-01-21 MED ORDER — HEPARIN SOD (PORK) LOCK FLUSH 100 UNIT/ML IV SOLN
500.0000 [IU] | Freq: Once | INTRAVENOUS | Status: AC | PRN
  Administered 2019-01-21: 500 [IU]
  Filled 2019-01-21: qty 5

## 2019-01-21 MED ORDER — DEXAMETHASONE SODIUM PHOSPHATE 10 MG/ML IJ SOLN
4.0000 mg | Freq: Once | INTRAMUSCULAR | Status: AC
  Administered 2019-01-21: 4 mg via INTRAVENOUS
  Filled 2019-01-21: qty 1

## 2019-01-21 MED ORDER — SODIUM CHLORIDE 0.9 % IV SOLN
60.0000 mg/m2 | Freq: Once | INTRAVENOUS | Status: AC
  Administered 2019-01-21: 114 mg via INTRAVENOUS
  Filled 2019-01-21: qty 19

## 2019-01-21 MED ORDER — DIPHENHYDRAMINE HCL 50 MG/ML IJ SOLN
50.0000 mg | Freq: Once | INTRAMUSCULAR | Status: AC
  Administered 2019-01-21: 50 mg via INTRAVENOUS
  Filled 2019-01-21: qty 1

## 2019-01-21 MED ORDER — SODIUM CHLORIDE 0.9% FLUSH
10.0000 mL | Freq: Once | INTRAVENOUS | Status: AC
  Administered 2019-01-21: 10 mL via INTRAVENOUS
  Filled 2019-01-21: qty 10

## 2019-01-21 NOTE — Progress Notes (Signed)
Patient stated that she had been doing well with no complaints. 

## 2019-01-21 NOTE — Progress Notes (Signed)
[facility] [facility] OFFICE PROGRESS NOTE  Patient Care Team: [name], [name], MD as Medical Oncologist (Medical Oncology)  Cancer Staging No matching staging information was found for the patient.   Oncology History Overview Note  # OCT 2015-STAGE IV LEFT BREAST T2N1 [T=4cm; [name]; [name]; [name]; EBUS- Positive Paratrac/subcarinal LN s/p ? Taxotere [in [place]; [name]; [name];[name]; [name]; suv 3]; [name]; [name]; [name];  [name]; Ant R iliac bone; [name]; [name]; START-Taxol weekly. March 2020- Taxol every 2 weeks [PN]  # Poorly controlled Blood sugars- improved.   # Pancreatitis Hx/PEI- on creon in past / CKD IV [creat ~ 3-4; [name]; [name]; mild left sided weakness]  # Jan 2020-  Lobular lesion on tongue- s/p excision pyogenic granuloma [Dr.McQueen]   # GENETIC TESTING/COUNSELLING: HETEROZYGOUS Cystic Fibrosis Gene [explains hx of recurrent pancreatitis]  # MOLECULAR TESTING: NA  ------------------------------------------------   DIAGNOSIS: [ 2015] BREAST CA; [name]; [name] ;GOALS: Palliative  CURRENT/MOST RECENT THERAPY [ April 2019] TAXOL    Carcinoma of upper-inner quadrant of left breast in female, estrogen receptor positive ([place])      INTERVAL HISTORY:  [name] 63 y.o.  female pleasant patient above history of ER PR positive HER-2 negative breast cancer on Taxol is  here for follow-up.  Patient denies any worsening shortness of breath or cough.  Denies any worsening headache.  Denies any worsening tingling or numbness.  Continues to be on Neurontin.  Continues to have mild swelling in the legs not any worse.  Mild fatigue.  Review of Systems  Constitutional: Positive for malaise/fatigue. Negative for chills, diaphoresis, [name], hemoptysis, sputum production, shortness of breath and wheezing.   Cardiovascular: Positive for leg swelling. Negative for chest pain, palpitations and orthopnea.  Gastrointestinal: Negative for abdominal pain, blood in stool, constipation, diarrhea, heartburn, melena, nausea and vomiting.  Genitourinary: Negative for dysuria, frequency and urgency.  Musculoskeletal: Negative for back pain and joint pain.  Skin: Negative.  Negative for itching and rash.  Neurological: Positive for tingling. Negative for dizziness, focal weakness, weakness and headaches.  Endo/Heme/Allergies: Does not bruise/bleed easily.  Psychiatric/Behavioral: Negative for depression. The patient is not nervous/anxious and does not have insomnia.       PAST MEDICAL HISTORY :  Past Medical History:  Diagnosis Date  . Anemia   . Asthma   . Cancer ([place]) 03/10/2018   Per [facility] PET order. Carcinoma of upper-inner quadrant of left breast in female, estrogen receptor positive .  Marland Kitchen CHF (congestive heart failure) ([place]) 1997  . CKD (chronic kidney disease)   . Depression   . Diabetes mellitus, type 2 ([place])   . Family history of breast cancer   . Family history of colon cancer   . Family history of ovarian cancer   . Family history of pancreatic cancer   .  Family history of prostate cancer   . Family history of stomach cancer   . Hair loss   . History of left breast cancer 05/29/14  . History of partial hysterectomy 12/31/2016    Per patient.  Has not had a period in years.  Had a partial hysterectomy years ago.  Marland Kitchen Hypertension   . Obesity   . Pancreatitis 1997  . Stroke [facility]) 2010   with mild left arm weakness    PAST SURGICAL HISTORY :   Past Surgical History:  Procedure Laterality Date  . CESAREAN SECTION    . CHOLECYSTECTOMY    . EXCISION OF TONGUE LESION N/A 08/17/2018   Procedure: EXCISION OF TONGUE LESION WITH FROZEN SECTIONS;  Surgeon: Beverly Gust, MD;  Location: [facility] ORS;  Service: ENT;  Laterality: N/A;  . PARTIAL HYSTERECTOMY  12/31/2016   Per patient, she has not had a period in years since she had a partial hysterectomy.    FAMILY HISTORY :   Family History  Problem Relation Age of Onset  . Ovarian cancer Mother 55  . Diabetes Mother   . Hypertension Mother   . COPD Father   . Hypertension Father   . Colon cancer Father 50  . Diabetes Sister   . Breast cancer Sister 38       bilateral  . Diabetes Brother   . Leukemia Maternal Aunt   . Pancreatic cancer Paternal Aunt 49  . Pancreatic cancer Paternal Uncle   . Colon cancer Paternal Uncle   . Stomach cancer Maternal Grandfather 81  . Throat cancer Paternal Grandmother   . Breast cancer Maternal Aunt 80  . Colon cancer Maternal Aunt   . Bone cancer Maternal Aunt   . Breast cancer Paternal Aunt        dx >50  . Prostate cancer Paternal Uncle   . Pancreatic cancer Paternal Uncle   . Throat cancer Paternal Uncle   . Lung cancer Paternal Uncle   . Stomach cancer Paternal Uncle   . Brain cancer Paternal Aunt   . Cancer Cousin        liver, kidney  . Prostate cancer Cousin        meastatic  . Lung cancer Other     SOCIAL HISTORY:   Social History   Tobacco Use  . Smoking status: Former Smoker    Packs/day: 0.50    Years: 1.00    Pack years: 0.50    Types: Cigarettes  . Smokeless tobacco: Never Used  Substance Use Topics  . Alcohol use: No    Alcohol/week: 0.0 standard drinks  . Drug use: No    ALLERGIES:  has  No Known Allergies.  MEDICATIONS:  Current Outpatient Medications  Medication Sig Dispense Refill  . acetaminophen-codeine (TYLENOL #4) 300-60 MG tablet Take 2 tablets by mouth every 6 (six) hours as needed for moderate pain.     Marland Kitchen albuterol (PROAIR HFA) 108 (90 BASE) MCG/ACT inhaler Inhale 2 puffs into the lungs every 6 (six) hours as needed for wheezing or shortness of breath.     Marland Kitchen albuterol (PROVENTIL) (2.5 MG/3ML) 0.083% nebulizer solution Inhale 2.5 mg into the lungs every 6 (six) hours as needed for shortness of breath.     . ALPRAZolam (XANAX) 0.5 MG tablet Take 0.5 mg by mouth at bedtime as needed for anxiety or sleep.     Marland Kitchen amLODipine (NORVASC) 10 MG tablet Take 10 mg by mouth daily.     Marland Kitchen aspirin EC 81  MG tablet Take 81 mg by mouth daily.     Marland Kitchen atenolol (TENORMIN) 100 MG tablet Take 100 mg by mouth 2 (two) times daily.     . B-D ULTRA-FINE 33 LANCETS MISC Use 1 each 2 (two) times daily.    . B-D ULTRAFINE III SHORT PEN 31G X 8 MM MISC     . bumetanide (BUMEX) 0.5 MG tablet Take 0.5 mg by mouth 2 (two) times daily.     . calcitRIOL (ROCALTROL) 0.25 MCG capsule Take 0.25 mcg by mouth every Monday, Wednesday, and Friday.     . Cinnamon 500 MG capsule Take 1,000 mg by mouth daily.     . cloNIDine (CATAPRES) 0.2 MG tablet Take 0.2 mg by mouth 2 (two) times daily.     . enalapril (VASOTEC) 10 MG tablet Take 2 tablets by mouth 2 (two) times a day.    . famotidine (PEPCID) 20 MG tablet Take 20 mg by mouth 2 (two) times daily.    . ferrous sulfate 325 (65 FE) MG tablet Take 325 mg by mouth 2 (two) times daily with a meal.    . FLUoxetine (PROZAC) 20 MG capsule Take 20 mg by mouth 2 (two) times daily.     . Fluticasone Propionate, Inhal, (FLOVENT DISKUS) 100 MCG/BLIST AEPB Inhale into the lungs. Inhale 2 inhalations into the lungs 2 times daily PRN    . gabapentin (NEURONTIN) 100 MG capsule TAKE 1 CAPSULE(100 MG) BY MOUTH AT BEDTIME 30 capsule 6  . glucose blood (ONE TOUCH ULTRA TEST) test  strip Use 1 each 2 (two) times daily. Use as instructed.    . glyBURIDE (DIABETA) 5 MG tablet Take 10 mg by mouth 2 (two) times daily with a meal.     . LEVEMIR FLEXTOUCH 100 UNIT/ML Pen Inject 55 Units into the skin daily.     Marland Kitchen loperamide (IMODIUM A-D) 2 MG capsule Take 2 mg by mouth as needed for diarrhea or loose stools.    . mometasone (NASONEX) 50 MCG/ACT nasal spray Place 2 sprays into the nose daily as needed (Allergies).     . NOVOLOG FLEXPEN 100 UNIT/ML FlexPen Inject 7 Units into the skin 2 (two) times daily.     . ondansetron (ZOFRAN) 8 MG tablet One pill every 8 hours as needed for nausea/vomitting. 40 tablet 1  . prochlorperazine (COMPAZINE) 10 MG tablet Take 1 tablet (10 mg total) by mouth every 6 (six) hours as needed for nausea or vomiting. Please note change in strength 60 tablet 4  . salmeterol (SEREVENT) 50 MCG/DOSE diskus inhaler Inhale 1 puff into the lungs daily as needed (shortness of breath).     . simvastatin (ZOCOR) 20 MG tablet Take 20 mg by mouth daily at 6 PM.     . vitamin B-12 (CYANOCOBALAMIN) 1000 MCG tablet Take 1,000 mcg by mouth daily.     No current facility-administered medications for this visit.    Facility-Administered Medications Ordered in Other Visits  Medication Dose Route Frequency Provider Last Rate Last Dose  . sodium chloride flush (NS) 0.9 % injection 10 mL  10 mL Intravenous PRN Cammie Sickle, MD   10 mL at 01/30/16 1054    PHYSICAL EXAMINATION: ECOG PERFORMANCE STATUS: 1 - Symptomatic but completely ambulatory  BP (!) 142/83 (Patient Position: Sitting)   Pulse 73   Temp (!) 97.3 F (36.3 C) (Tympanic)   Ht '5\' 2"'  (1.575 m)   Wt 213 lb (96.6 kg)   BMI 38.96 kg/m  Filed Weights   01/21/19 0841  Weight: 213 lb (96.6 kg)    Physical Exam  Constitutional: She is oriented to person, place, and time and well-developed, well-nourished, and in no distress.  She is alone.  HENT:  Head: Normocephalic and atraumatic.   Mouth/Throat: Oropharynx is clear and moist. No oropharyngeal exudate.  Eyes: Pupils are equal, round, and reactive to light.  Neck: Normal range of motion. Neck supple.  Cardiovascular: Normal rate and regular rhythm.  Pulmonary/Chest: Breath sounds normal. No respiratory distress. She has no wheezes.  Abdominal: Soft. Bowel sounds are normal. She exhibits no distension and no mass. There is no abdominal tenderness. There is no rebound and no guarding.  Musculoskeletal: Normal range of motion.        General: Edema present. No tenderness.  Neurological: She is alert and oriented to person, place, and time.  Skin: Skin is warm.  Psychiatric: Affect normal.       LABORATORY DATA:  I have reviewed the data as listed    Component Value Date/Time   NA 135 01/21/2019 0757   NA 130 (L) 06/06/2014 1102   K 4.1 01/21/2019 0757   K 3.9 06/06/2014 1102   CL 103 01/21/2019 0757   CL 95 (L) 06/06/2014 1102   CO2 24 01/21/2019 0757   CO2 28 06/06/2014 1102   GLUCOSE 130 (H) 01/21/2019 0757   GLUCOSE 349 (H) 06/06/2014 1102   BUN 41 (H) 01/21/2019 0757   BUN 17 06/06/2014 1102   CREATININE 3.35 (H) 01/21/2019 0757   CREATININE 1.63 (H) 06/06/2014 1102   CALCIUM 8.5 (L) 01/21/2019 0757   CALCIUM 9.2 06/06/2014 1102   PROT 7.1 01/21/2019 0757   PROT 8.2 06/06/2014 1102   ALBUMIN 3.8 01/21/2019 0757   ALBUMIN 3.3 (L) 06/06/2014 1102   AST 17 01/21/2019 0757   AST 7 (L) 06/06/2014 1102   ALT 15 01/21/2019 0757   ALT 12 (L) 06/06/2014 1102   ALKPHOS 68 01/21/2019 0757   ALKPHOS 74 06/06/2014 1102   BILITOT 0.5 01/21/2019 0757   BILITOT 0.4 06/06/2014 1102   GFRNONAA 14 (L) 01/21/2019 0757   GFRNONAA 35 (L) 06/06/2014 1102   GFRAA 16 (L) 01/21/2019 0757   GFRAA 42 (L) 06/06/2014 1102    No results found for: SPEP, UPEP  Lab Results  Component Value Date   WBC 7.4 01/21/2019   NEUTROABS 5.2 01/21/2019   HGB 10.0 (L) 01/21/2019   HCT 29.7 (L) 01/21/2019   MCV 90.8 01/21/2019    PLT 246 01/21/2019      Chemistry      Component Value Date/Time   NA 135 01/21/2019 0757   NA 130 (L) 06/06/2014 1102   K 4.1 01/21/2019 0757   K 3.9 06/06/2014 1102   CL 103 01/21/2019 0757   CL 95 (L) 06/06/2014 1102   CO2 24 01/21/2019 0757   CO2 28 06/06/2014 1102   BUN 41 (H) 01/21/2019 0757   BUN 17 06/06/2014 1102   CREATININE 3.35 (H) 01/21/2019 0757   CREATININE 1.63 (H) 06/06/2014 1102      Component Value Date/Time   CALCIUM 8.5 (L) 01/21/2019 0757   CALCIUM 9.2 06/06/2014 1102   ALKPHOS 68 01/21/2019 0757   ALKPHOS 74 06/06/2014 1102   AST 17 01/21/2019 0757   AST 7 (L) 06/06/2014 1102   ALT 15 01/21/2019 0757   ALT 12 (L) 06/06/2014 1102   BILITOT 0.5 01/21/2019 0757   BILITOT 0.4 06/06/2014 1102  RADIOGRAPHIC STUDIES: I have personally reviewed the radiological images as listed and agreed with the findings in the report. No results found.   ASSESSMENT & PLAN:  Carcinoma of upper-inner quadrant of left breast in female, estrogen receptor positive ([place]) Left breast cancer- stage IV- ER/PR +, HER-2/neu - on Taxol chemotherapy; PET May 31st 2020- stable bone lesions/left breast mass; decreasing effusions/left lung atelectasis; right pelvic LN- improved.  Clinically, stable.  However tumor markers slowly rising ~95.   # Proceed with Taxol every 2 weeks. Labs today reviewed;  acceptable for treatment today.  Discussed that rising tumor markers can precede progression noted on the PET scan.  However I would not recommend changing treatment especially tumor markers.  Continue current therapy for now.  Patient agreement.  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   # Chronic kidney disease - stage IV-creat-3.35- stable.   # Anemia- hemoglobin today-9-10; stable.    #  DISPOSITION:  # Treatment today # follow up in July 2nd /labs/MD-cbc/cmp-ca-27-29; Taxol;-  Dr.B   No orders of the defined types were placed in this encounter.  All questions were answered. The patient knows to call the clinic with any problems, questions or concerns.      Cammie Sickle, MD 01/21/2019 1:02 PM

## 2019-01-21 NOTE — Assessment & Plan Note (Addendum)
Left breast cancer- stage IV- ER/PR +, HER-2/neu - on Taxol chemotherapy; PET May 31st 2020- stable bone lesions/left breast mass; decreasing effusions/left lung atelectasis; right pelvic LN- improved.  Clinically, stable.  However tumor markers slowly rising ~95.   # Proceed with Taxol every 2 weeks. Labs today reviewed;  acceptable for treatment today.  Discussed that rising tumor markers can precede progression noted on the PET scan.  However I would not recommend changing treatment especially tumor markers.  Continue current therapy for now.  Patient agreement.  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   # Chronic kidney disease - stage IV-creat-3.35- stable.   # Anemia- hemoglobin today-9-10; stable.    #  DISPOSITION:  # Treatment today # follow up in July 2nd /labs/MD-cbc/cmp-ca-27-29; Taxol;- Dr.B

## 2019-01-22 LAB — CANCER ANTIGEN 27.29: CA 27.29: 80.8 U/mL — ABNORMAL HIGH (ref 0.0–38.6)

## 2019-01-28 DIAGNOSIS — N2581 Secondary hyperparathyroidism of renal origin: Secondary | ICD-10-CM | POA: Diagnosis not present

## 2019-01-28 DIAGNOSIS — N185 Chronic kidney disease, stage 5: Secondary | ICD-10-CM | POA: Diagnosis not present

## 2019-01-28 DIAGNOSIS — I120 Hypertensive chronic kidney disease with stage 5 chronic kidney disease or end stage renal disease: Secondary | ICD-10-CM | POA: Diagnosis not present

## 2019-01-28 DIAGNOSIS — E1122 Type 2 diabetes mellitus with diabetic chronic kidney disease: Secondary | ICD-10-CM | POA: Diagnosis not present

## 2019-02-02 ENCOUNTER — Other Ambulatory Visit: Payer: Self-pay

## 2019-02-02 ENCOUNTER — Telehealth: Payer: Self-pay | Admitting: Internal Medicine

## 2019-02-02 NOTE — Telephone Encounter (Signed)
Spoke with pt to confirm appt date/time, do pre-appt screen which was completed, and adv of Covid-19 guidelines for appt regarding screening questions, temperature check, face mask required, and no visitors allowed °

## 2019-02-03 ENCOUNTER — Other Ambulatory Visit: Payer: Self-pay

## 2019-02-03 ENCOUNTER — Inpatient Hospital Stay (HOSPITAL_BASED_OUTPATIENT_CLINIC_OR_DEPARTMENT_OTHER): Payer: Medicare HMO | Admitting: Internal Medicine

## 2019-02-03 ENCOUNTER — Inpatient Hospital Stay: Payer: Medicare HMO | Attending: Internal Medicine

## 2019-02-03 ENCOUNTER — Encounter: Payer: Self-pay | Admitting: Internal Medicine

## 2019-02-03 ENCOUNTER — Inpatient Hospital Stay: Payer: Medicare HMO

## 2019-02-03 DIAGNOSIS — F419 Anxiety disorder, unspecified: Secondary | ICD-10-CM

## 2019-02-03 DIAGNOSIS — Z7951 Long term (current) use of inhaled steroids: Secondary | ICD-10-CM | POA: Diagnosis not present

## 2019-02-03 DIAGNOSIS — Z803 Family history of malignant neoplasm of breast: Secondary | ICD-10-CM | POA: Insufficient documentation

## 2019-02-03 DIAGNOSIS — E119 Type 2 diabetes mellitus without complications: Secondary | ICD-10-CM

## 2019-02-03 DIAGNOSIS — Z5111 Encounter for antineoplastic chemotherapy: Secondary | ICD-10-CM | POA: Insufficient documentation

## 2019-02-03 DIAGNOSIS — N184 Chronic kidney disease, stage 4 (severe): Secondary | ICD-10-CM

## 2019-02-03 DIAGNOSIS — D649 Anemia, unspecified: Secondary | ICD-10-CM

## 2019-02-03 DIAGNOSIS — Z170 Estrogen receptor positive status [ER+]: Secondary | ICD-10-CM

## 2019-02-03 DIAGNOSIS — Z7982 Long term (current) use of aspirin: Secondary | ICD-10-CM

## 2019-02-03 DIAGNOSIS — F329 Major depressive disorder, single episode, unspecified: Secondary | ICD-10-CM

## 2019-02-03 DIAGNOSIS — C50212 Malignant neoplasm of upper-inner quadrant of left female breast: Secondary | ICD-10-CM

## 2019-02-03 DIAGNOSIS — J45909 Unspecified asthma, uncomplicated: Secondary | ICD-10-CM

## 2019-02-03 DIAGNOSIS — Z87891 Personal history of nicotine dependence: Secondary | ICD-10-CM | POA: Insufficient documentation

## 2019-02-03 DIAGNOSIS — Z794 Long term (current) use of insulin: Secondary | ICD-10-CM | POA: Diagnosis not present

## 2019-02-03 DIAGNOSIS — Z79899 Other long term (current) drug therapy: Secondary | ICD-10-CM | POA: Insufficient documentation

## 2019-02-03 DIAGNOSIS — Z7189 Other specified counseling: Secondary | ICD-10-CM

## 2019-02-03 DIAGNOSIS — Z95828 Presence of other vascular implants and grafts: Secondary | ICD-10-CM

## 2019-02-03 LAB — CBC WITH DIFFERENTIAL/PLATELET
Abs Immature Granulocytes: 0.11 10*3/uL — ABNORMAL HIGH (ref 0.00–0.07)
Basophils Absolute: 0 10*3/uL (ref 0.0–0.1)
Basophils Relative: 0 %
Eosinophils Absolute: 0.1 10*3/uL (ref 0.0–0.5)
Eosinophils Relative: 2 %
HCT: 28.9 % — ABNORMAL LOW (ref 36.0–46.0)
Hemoglobin: 9.9 g/dL — ABNORMAL LOW (ref 12.0–15.0)
Immature Granulocytes: 2 %
Lymphocytes Relative: 15 %
Lymphs Abs: 1.1 10*3/uL (ref 0.7–4.0)
MCH: 31 pg (ref 26.0–34.0)
MCHC: 34.3 g/dL (ref 30.0–36.0)
MCV: 90.6 fL (ref 80.0–100.0)
Monocytes Absolute: 0.9 10*3/uL (ref 0.1–1.0)
Monocytes Relative: 12 %
Neutro Abs: 5 10*3/uL (ref 1.7–7.7)
Neutrophils Relative %: 69 %
Platelets: 320 10*3/uL (ref 150–400)
RBC: 3.19 MIL/uL — ABNORMAL LOW (ref 3.87–5.11)
RDW: 12.7 % (ref 11.5–15.5)
WBC: 7.2 10*3/uL (ref 4.0–10.5)
nRBC: 0 % (ref 0.0–0.2)

## 2019-02-03 LAB — COMPREHENSIVE METABOLIC PANEL
ALT: 18 U/L (ref 0–44)
AST: 18 U/L (ref 15–41)
Albumin: 3.7 g/dL (ref 3.5–5.0)
Alkaline Phosphatase: 69 U/L (ref 38–126)
Anion gap: 8 (ref 5–15)
BUN: 32 mg/dL — ABNORMAL HIGH (ref 8–23)
CO2: 24 mmol/L (ref 22–32)
Calcium: 8.4 mg/dL — ABNORMAL LOW (ref 8.9–10.3)
Chloride: 103 mmol/L (ref 98–111)
Creatinine, Ser: 3.17 mg/dL — ABNORMAL HIGH (ref 0.44–1.00)
GFR calc Af Amer: 17 mL/min — ABNORMAL LOW (ref >60)
GFR calc non Af Amer: 15 mL/min — ABNORMAL LOW (ref >60)
Glucose, Bld: 159 mg/dL — ABNORMAL HIGH (ref 70–99)
Potassium: 4.3 mmol/L (ref 3.5–5.1)
Sodium: 135 mmol/L (ref 135–145)
Total Bilirubin: 0.6 mg/dL (ref 0.3–1.2)
Total Protein: 7.1 g/dL (ref 6.5–8.1)

## 2019-02-03 MED ORDER — SODIUM CHLORIDE 0.9 % IV SOLN
60.0000 mg/m2 | Freq: Once | INTRAVENOUS | Status: AC
  Administered 2019-02-03: 11:00:00 114 mg via INTRAVENOUS
  Filled 2019-02-03: qty 19

## 2019-02-03 MED ORDER — SODIUM CHLORIDE 0.9% FLUSH
10.0000 mL | Freq: Once | INTRAVENOUS | Status: AC
  Administered 2019-02-03: 08:00:00 10 mL via INTRAVENOUS
  Filled 2019-02-03: qty 10

## 2019-02-03 MED ORDER — HEPARIN SOD (PORK) LOCK FLUSH 100 UNIT/ML IV SOLN
500.0000 [IU] | Freq: Once | INTRAVENOUS | Status: AC | PRN
  Administered 2019-02-03: 500 [IU]
  Filled 2019-02-03: qty 5

## 2019-02-03 MED ORDER — SODIUM CHLORIDE 0.9 % IV SOLN
Freq: Once | INTRAVENOUS | Status: AC
  Administered 2019-02-03: 10:00:00 via INTRAVENOUS
  Filled 2019-02-03: qty 250

## 2019-02-03 MED ORDER — FAMOTIDINE IN NACL 20-0.9 MG/50ML-% IV SOLN
20.0000 mg | Freq: Once | INTRAVENOUS | Status: AC
  Administered 2019-02-03: 10:00:00 20 mg via INTRAVENOUS
  Filled 2019-02-03: qty 50

## 2019-02-03 MED ORDER — DIPHENHYDRAMINE HCL 50 MG/ML IJ SOLN
50.0000 mg | Freq: Once | INTRAMUSCULAR | Status: AC
  Administered 2019-02-03: 50 mg via INTRAVENOUS
  Filled 2019-02-03: qty 1

## 2019-02-03 MED ORDER — DEXAMETHASONE SODIUM PHOSPHATE 10 MG/ML IJ SOLN
4.0000 mg | Freq: Once | INTRAMUSCULAR | Status: AC
  Administered 2019-02-03: 10:00:00 4 mg via INTRAVENOUS
  Filled 2019-02-03: qty 1

## 2019-02-03 NOTE — Progress Notes (Signed)
[facility] OFFICE PROGRESS NOTE  Patient Care Team: [name], [name], MD as Medical Oncologist (Medical Oncology)  Cancer Staging No matching staging information was found for the patient.   Oncology History Overview Note  # OCT 2015-STAGE IV LEFT BREAST T2N1 [T=4cm; [name]; [name]; [name]; EBUS- Positive Paratrac/subcarinal LN s/p ? Taxotere [in [place]; [name]; [name];[name]; [name]; suv 3]; [name]; [name]; [name];  [name]; Ant R iliac bone; [name]; [name]; START-Taxol weekly. March 2020- Taxol every 2 weeks [PN]  # Poorly controlled Blood sugars- improved.   # Pancreatitis Hx/PEI- on creon in past / CKD IV [creat ~ 3-4; [name]; [name]; mild left sided weakness]  # Jan 2020-  Lobular lesion on tongue- s/p excision pyogenic granuloma [Dr.McQueen]   # GENETIC TESTING/COUNSELLING: HETEROZYGOUS Cystic Fibrosis Gene [explains hx of recurrent pancreatitis]  # MOLECULAR TESTING: NA  ------------------------------------------------   DIAGNOSIS: [ 2015] BREAST CA; [name]; [name] ;GOALS: Palliative  CURRENT/MOST RECENT THERAPY [ April 2019] TAXOL    Carcinoma of upper-inner quadrant of left breast in female, estrogen receptor positive ([place])      INTERVAL HISTORY:  [name] 63 y.o.  female pleasant patient above history of ER PR positive HER-2 negative breast cancer on Taxol is  here for follow-up.  Patient denies any headaches.  Denies any nausea vomiting.  Denies any chest pain.  Continues to have chronic mild tingling numbness extremities.  She continues with Neurontin.  Chronic mild swelling in the legs not any worse.  Review of Systems  Constitutional: Positive for malaise/fatigue. Negative for chills, diaphoresis, [name], hemoptysis, sputum production, shortness of breath and wheezing.   Cardiovascular: Positive for leg swelling. Negative for chest pain, palpitations and orthopnea.  Gastrointestinal: Negative for abdominal pain, blood in stool, constipation, diarrhea, heartburn, melena, nausea and vomiting.  Genitourinary: Negative for dysuria, frequency and urgency.  Musculoskeletal: Negative for back pain and joint pain.  Skin: Negative.  Negative for itching and rash.  Neurological: Positive for tingling. Negative for dizziness, focal weakness, weakness and headaches.  Endo/Heme/Allergies: Does not bruise/bleed easily.  Psychiatric/Behavioral: Negative for depression. The patient is not nervous/anxious and does not have insomnia.       PAST MEDICAL HISTORY :  Past Medical History:  Diagnosis Date  . Anemia   . Anxiety   . Asthma   . Cancer ([place]) 03/10/2018   Per [facility] PET order. Carcinoma of upper-inner quadrant of left breast in female, estrogen receptor positive .  Marland Kitchen Cancer (HCC)    LUNG  . CHF (congestive heart failure) ([place]) 1997  . CKD (chronic kidney disease)   . Depression   . Diabetes mellitus, type 2 ([place])   . Family history of breast cancer   . Family history of colon cancer   . Family history of ovarian cancer   .  Family history of pancreatic cancer   . Family history of prostate cancer   . Family history of stomach cancer   . Hair loss   . History of left breast cancer 05/29/14  . History of partial  hysterectomy 12/31/2016   Per patient.  Has not had a period in years.  Had a partial hysterectomy years ago.  Marland Kitchen Hypertension   . Obesity   . Pancreatitis 1997  . Stroke [facility]) 2010   with mild left arm weakness    PAST SURGICAL HISTORY :   Past Surgical History:  Procedure Laterality Date  . CESAREAN SECTION    . CHOLECYSTECTOMY    . EXCISION OF TONGUE LESION N/A 08/17/2018   Procedure: EXCISION OF TONGUE LESION WITH FROZEN SECTIONS;  Surgeon: Beverly Gust, MD;  Location: [facility] ORS;  Service: ENT;  Laterality: N/A;  . PARTIAL HYSTERECTOMY  12/31/2016   Per patient, she has not had a period in years since she had a partial hysterectomy.  Marland Kitchen PORTA CATH INSERTION    . TUBAL LIGATION      FAMILY HISTORY :   Family History  Problem Relation Age of Onset  . Ovarian cancer Mother 74  . Diabetes Mother   . Hypertension Mother   . COPD Father   . Hypertension Father   . Colon cancer Father 98  . Diabetes Sister   . Breast cancer Sister 105       bilateral  . Diabetes Brother   . Leukemia Maternal Aunt   . Pancreatic cancer Paternal Aunt 56  . Pancreatic cancer Paternal Uncle   . Colon cancer Paternal Uncle   . Stomach cancer Maternal Grandfather 61  . Throat cancer Paternal Grandmother   . Breast cancer Maternal Aunt 80  . Colon cancer Maternal Aunt   . Bone cancer Maternal Aunt   . Breast cancer Paternal Aunt        dx >50  . Prostate cancer Paternal Uncle   . Pancreatic cancer Paternal Uncle   . Throat cancer Paternal Uncle   . Lung cancer Paternal Uncle   . Stomach cancer Paternal Uncle   . Brain cancer Paternal Aunt   . Cancer Cousin        liver, kidney  . Prostate cancer Cousin        meastatic  . Lung cancer Other     SOCIAL HISTORY:   Social History   Tobacco Use  . Smoking status: Former Smoker    Packs/day: 0.50    Years: 1.00    Pack years: 0.50    Types: Cigarettes  . Smokeless tobacco: Never Used  Substance Use Topics  . Alcohol use: No     Alcohol/week: 0.0 standard drinks  . Drug use: No    ALLERGIES:  has No Known Allergies.  MEDICATIONS:  Current Outpatient Medications  Medication Sig Dispense Refill  . acetaminophen-codeine (TYLENOL #4) 300-60 MG tablet Take 2 tablets by mouth every 6 (six) hours as needed for moderate pain.     Marland Kitchen albuterol (PROAIR HFA) 108 (90 BASE) MCG/ACT inhaler Inhale 2 puffs into the lungs every 6 (six) hours as needed for wheezing or shortness of breath.     Marland Kitchen albuterol (PROVENTIL) (2.5 MG/3ML) 0.083% nebulizer solution Inhale 2.5 mg into the lungs every 6 (six) hours as needed for shortness of breath.     . ALPRAZolam (XANAX) 0.5 MG tablet Take 0.5 mg by mouth at bedtime as needed for anxiety or sleep.     Marland Kitchen  amLODipine (NORVASC) 10 MG tablet Take 10 mg by mouth daily.     Marland Kitchen aspirin EC 81 MG tablet Take 81 mg by mouth daily.     Marland Kitchen atenolol (TENORMIN) 100 MG tablet Take 100 mg by mouth 2 (two) times daily.     . B-D ULTRA-FINE 33 LANCETS MISC Use 1 each 2 (two) times daily.    . B-D ULTRAFINE III SHORT PEN 31G X 8 MM MISC     . bumetanide (BUMEX) 0.5 MG tablet Take 0.5 mg by mouth 2 (two) times daily.     . calcitRIOL (ROCALTROL) 0.25 MCG capsule Take 0.25 mcg by mouth every Monday, Wednesday, and Friday.     . Cinnamon 500 MG capsule Take 1,000 mg by mouth daily.     . cloNIDine (CATAPRES) 0.2 MG tablet Take 0.2 mg by mouth 2 (two) times daily.     . enalapril (VASOTEC) 10 MG tablet Take 2 tablets by mouth 2 (two) times a day.    . famotidine (PEPCID) 20 MG tablet Take 20 mg by mouth 2 (two) times daily.    . ferrous sulfate 325 (65 FE) MG tablet Take 325 mg by mouth 2 (two) times daily with a meal.    . FLUoxetine (PROZAC) 20 MG capsule Take 20 mg by mouth 2 (two) times daily.     . Fluticasone Propionate, Inhal, (FLOVENT DISKUS) 100 MCG/BLIST AEPB Inhale into the lungs. Inhale 2 inhalations into the lungs 2 times daily PRN    . gabapentin (NEURONTIN) 100 MG capsule TAKE 1 CAPSULE(100 MG) BY  MOUTH AT BEDTIME 30 capsule 6  . glucose blood (ONE TOUCH ULTRA TEST) test strip Use 1 each 2 (two) times daily. Use as instructed.    . glyBURIDE (DIABETA) 5 MG tablet Take 10 mg by mouth 2 (two) times daily with a meal.     . LEVEMIR FLEXTOUCH 100 UNIT/ML Pen Inject 55 Units into the skin daily.     Marland Kitchen loperamide (IMODIUM A-D) 2 MG capsule Take 2 mg by mouth as needed for diarrhea or loose stools.    . mometasone (NASONEX) 50 MCG/ACT nasal spray Place 2 sprays into the nose daily as needed (Allergies).     . NOVOLOG FLEXPEN 100 UNIT/ML FlexPen Inject 7 Units into the skin 2 (two) times daily.     . ondansetron (ZOFRAN) 8 MG tablet One pill every 8 hours as needed for nausea/vomitting. 40 tablet 1  . prochlorperazine (COMPAZINE) 10 MG tablet Take 1 tablet (10 mg total) by mouth every 6 (six) hours as needed for nausea or vomiting. Please note change in strength 60 tablet 4  . salmeterol (SEREVENT) 50 MCG/DOSE diskus inhaler Inhale 1 puff into the lungs daily as needed (shortness of breath).     . simvastatin (ZOCOR) 20 MG tablet Take 20 mg by mouth daily at 6 PM.     . vitamin B-12 (CYANOCOBALAMIN) 1000 MCG tablet Take 1,000 mcg by mouth daily.     No current facility-administered medications for this visit.    Facility-Administered Medications Ordered in Other Visits  Medication Dose Route Frequency Provider Last Rate Last Dose  . sodium chloride flush (NS) 0.9 % injection 10 mL  10 mL Intravenous PRN Cammie Sickle, MD   10 mL at 01/30/16 1054    PHYSICAL EXAMINATION: ECOG PERFORMANCE STATUS: 1 - Symptomatic but completely ambulatory  BP (!) 162/97 (Patient Position: Sitting, Cuff Size: Normal)   Pulse 71   Temp 97.6 F (36.4  C) (Tympanic)   Resp 20   Ht 5' 2"  (1.575 m)   Wt 212 lb (96.2 kg)   BMI 38.78 kg/m   Filed Weights   02/03/19 0828  Weight: 212 lb (96.2 kg)    Physical Exam  Constitutional: She is oriented to person, place, and time and well-developed,  well-nourished, and in no distress.  She is alone.  HENT:  Head: Normocephalic and atraumatic.  Mouth/Throat: Oropharynx is clear and moist. No oropharyngeal exudate.  Eyes: Pupils are equal, round, and reactive to light.  Neck: Normal range of motion. Neck supple.  Cardiovascular: Normal rate and regular rhythm.  Pulmonary/Chest: Breath sounds normal. No respiratory distress. She has no wheezes.  Abdominal: Soft. Bowel sounds are normal. She exhibits no distension and no mass. There is no abdominal tenderness. There is no rebound and no guarding.  Musculoskeletal: Normal range of motion.        General: Edema present. No tenderness.  Neurological: She is alert and oriented to person, place, and time.  Skin: Skin is warm.  Psychiatric: Affect normal.       LABORATORY DATA:  I have reviewed the data as listed    Component Value Date/Time   NA 135 02/03/2019 0803   NA 130 (L) 06/06/2014 1102   K 4.3 02/03/2019 0803   K 3.9 06/06/2014 1102   CL 103 02/03/2019 0803   CL 95 (L) 06/06/2014 1102   CO2 24 02/03/2019 0803   CO2 28 06/06/2014 1102   GLUCOSE 159 (H) 02/03/2019 0803   GLUCOSE 349 (H) 06/06/2014 1102   BUN 32 (H) 02/03/2019 0803   BUN 17 06/06/2014 1102   CREATININE 3.17 (H) 02/03/2019 0803   CREATININE 1.63 (H) 06/06/2014 1102   CALCIUM 8.4 (L) 02/03/2019 0803   CALCIUM 9.2 06/06/2014 1102   PROT 7.1 02/03/2019 0803   PROT 8.2 06/06/2014 1102   ALBUMIN 3.7 02/03/2019 0803   ALBUMIN 3.3 (L) 06/06/2014 1102   AST 18 02/03/2019 0803   AST 7 (L) 06/06/2014 1102   ALT 18 02/03/2019 0803   ALT 12 (L) 06/06/2014 1102   ALKPHOS 69 02/03/2019 0803   ALKPHOS 74 06/06/2014 1102   BILITOT 0.6 02/03/2019 0803   BILITOT 0.4 06/06/2014 1102   GFRNONAA 15 (L) 02/03/2019 0803   GFRNONAA 35 (L) 06/06/2014 1102   GFRAA 17 (L) 02/03/2019 0803   GFRAA 42 (L) 06/06/2014 1102    No results found for: SPEP, UPEP  Lab Results  Component Value Date   WBC 7.2 02/03/2019    NEUTROABS 5.0 02/03/2019   HGB 9.9 (L) 02/03/2019   HCT 28.9 (L) 02/03/2019   MCV 90.6 02/03/2019   PLT 320 02/03/2019      Chemistry      Component Value Date/Time   NA 135 02/03/2019 0803   NA 130 (L) 06/06/2014 1102   K 4.3 02/03/2019 0803   K 3.9 06/06/2014 1102   CL 103 02/03/2019 0803   CL 95 (L) 06/06/2014 1102   CO2 24 02/03/2019 0803   CO2 28 06/06/2014 1102   BUN 32 (H) 02/03/2019 0803   BUN 17 06/06/2014 1102   CREATININE 3.17 (H) 02/03/2019 0803   CREATININE 1.63 (H) 06/06/2014 1102      Component Value Date/Time   CALCIUM 8.4 (L) 02/03/2019 0803   CALCIUM 9.2 06/06/2014 1102   ALKPHOS 69 02/03/2019 0803   ALKPHOS 74 06/06/2014 1102   AST 18 02/03/2019 0803   AST 7 (L) 06/06/2014 1102  ALT 18 02/03/2019 0803   ALT 12 (L) 06/06/2014 1102   BILITOT 0.6 02/03/2019 0803   BILITOT 0.4 06/06/2014 1102       RADIOGRAPHIC STUDIES: I have personally reviewed the radiological images as listed and agreed with the findings in the report. No results found.   ASSESSMENT & PLAN:  Carcinoma of upper-inner quadrant of left breast in female, estrogen receptor positive ([place]) Left breast cancer- stage IV- ER/PR +, HER-2/neu - on Taxol chemotherapy; PET May 31st 2020- stable bone lesions/left breast mass; decreasing effusions/left lung atelectasis; right pelvic LN- improved.  Clinically stable; TM-80-90s.    # Proceed with Taxol every 2 weeks. Labs today reviewed;  acceptable for treatment today.  # cataracts surgery- on July 24th- at [facility] eye.   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   # Bil LE swelling/ededma MILD- discussed re: compression stocking/ leg- stable.   # Chronic kidney disease - stage IV-creat-3.313- stable/Dr.Kolluru  # Anemia- hemoglobin today-9-10 stable.   #  DISPOSITION:  # Treatment today # follow up in July 31st [cataract] labs/MD-cbc/cmp-ca-27-29; Taxol;- Dr.B   No orders of the defined types were placed in this  encounter.  All questions were answered. The patient knows to call the clinic with any problems, questions or concerns.      Cammie Sickle, MD 02/15/2019 7:57 PM

## 2019-02-03 NOTE — Assessment & Plan Note (Addendum)
Left breast cancer- stage IV- ER/PR +, HER-2/neu - on Taxol chemotherapy; PET May 31st 2020- stable bone lesions/left breast mass; decreasing effusions/left lung atelectasis; right pelvic LN- improved.  Clinically stable; TM-80-90s.    # Proceed with Taxol every 2 weeks. Labs today reviewed;  acceptable for treatment today.  # cataracts surgery- on July 24th- at [facility] eye.   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   # Bil LE swelling/ededma MILD- discussed re: compression stocking/ leg- stable.   # Chronic kidney disease - stage IV-creat-3.313- stable/Dr.Kolluru  # Anemia- hemoglobin today-9-10 stable.   #  DISPOSITION:  # Treatment today # follow up in July 31st [cataract] labs/MD-cbc/cmp-ca-27-29; Taxol;- Dr.B

## 2019-02-04 LAB — CANCER ANTIGEN 27.29: CA 27.29: 83.7 U/mL — ABNORMAL HIGH (ref 0.0–38.6)

## 2019-02-14 DIAGNOSIS — H25041 Posterior subcapsular polar age-related cataract, right eye: Secondary | ICD-10-CM | POA: Diagnosis not present

## 2019-02-14 DIAGNOSIS — I340 Nonrheumatic mitral (valve) insufficiency: Secondary | ICD-10-CM | POA: Diagnosis not present

## 2019-02-14 DIAGNOSIS — H2513 Age-related nuclear cataract, bilateral: Secondary | ICD-10-CM | POA: Diagnosis not present

## 2019-02-15 ENCOUNTER — Encounter: Payer: Self-pay | Admitting: *Deleted

## 2019-02-21 ENCOUNTER — Other Ambulatory Visit
Admission: RE | Admit: 2019-02-21 | Discharge: 2019-02-21 | Disposition: A | Discharge status: Home / Self Care | Payer: Medicare HMO | Source: Ambulatory Visit | Attending: Ophthalmology | Admitting: Ophthalmology

## 2019-02-21 ENCOUNTER — Other Ambulatory Visit: Payer: Self-pay

## 2019-02-21 DIAGNOSIS — Z1159 Encounter for screening for other viral diseases: Secondary | ICD-10-CM | POA: Diagnosis not present

## 2019-02-22 LAB — SARS CORONAVIRUS 2 (TAT 6-24 HRS): SARS Coronavirus 2: NEGATIVE

## 2019-02-24 ENCOUNTER — Ambulatory Visit
Admission: RE | Admit: 2019-02-24 | Discharge: 2019-02-24 | Disposition: A | Discharge status: Home / Self Care | Payer: Medicare HMO | Attending: Ophthalmology | Admitting: Ophthalmology

## 2019-02-24 ENCOUNTER — Other Ambulatory Visit: Payer: Self-pay

## 2019-02-24 ENCOUNTER — Encounter
Admission: RE | Disposition: A | Discharge status: Home / Self Care | Payer: Self-pay | Source: Home / Self Care | Attending: Ophthalmology

## 2019-02-24 ENCOUNTER — Encounter: Payer: Self-pay | Admitting: Certified Registered Nurse Anesthetist

## 2019-02-24 ENCOUNTER — Ambulatory Visit: Payer: Self-pay | Admitting: Certified Registered Nurse Anesthetist

## 2019-02-24 DIAGNOSIS — E1122 Type 2 diabetes mellitus with diabetic chronic kidney disease: Secondary | ICD-10-CM | POA: Diagnosis not present

## 2019-02-24 DIAGNOSIS — Z7982 Long term (current) use of aspirin: Secondary | ICD-10-CM | POA: Diagnosis not present

## 2019-02-24 DIAGNOSIS — E1136 Type 2 diabetes mellitus with diabetic cataract: Secondary | ICD-10-CM | POA: Insufficient documentation

## 2019-02-24 DIAGNOSIS — H2511 Age-related nuclear cataract, right eye: Secondary | ICD-10-CM | POA: Insufficient documentation

## 2019-02-24 DIAGNOSIS — Z794 Long term (current) use of insulin: Secondary | ICD-10-CM | POA: Insufficient documentation

## 2019-02-24 DIAGNOSIS — I130 Hypertensive heart and chronic kidney disease with heart failure and stage 1 through stage 4 chronic kidney disease, or unspecified chronic kidney disease: Secondary | ICD-10-CM | POA: Diagnosis not present

## 2019-02-24 DIAGNOSIS — N189 Chronic kidney disease, unspecified: Secondary | ICD-10-CM | POA: Insufficient documentation

## 2019-02-24 DIAGNOSIS — E782 Mixed hyperlipidemia: Secondary | ICD-10-CM | POA: Diagnosis not present

## 2019-02-24 DIAGNOSIS — I509 Heart failure, unspecified: Secondary | ICD-10-CM | POA: Insufficient documentation

## 2019-02-24 DIAGNOSIS — E1140 Type 2 diabetes mellitus with diabetic neuropathy, unspecified: Secondary | ICD-10-CM | POA: Diagnosis not present

## 2019-02-24 DIAGNOSIS — F419 Anxiety disorder, unspecified: Secondary | ICD-10-CM | POA: Diagnosis not present

## 2019-02-24 DIAGNOSIS — I69354 Hemiplegia and hemiparesis following cerebral infarction affecting left non-dominant side: Secondary | ICD-10-CM | POA: Diagnosis not present

## 2019-02-24 DIAGNOSIS — K219 Gastro-esophageal reflux disease without esophagitis: Secondary | ICD-10-CM | POA: Diagnosis not present

## 2019-02-24 DIAGNOSIS — H25041 Posterior subcapsular polar age-related cataract, right eye: Secondary | ICD-10-CM | POA: Diagnosis not present

## 2019-02-24 DIAGNOSIS — Z87891 Personal history of nicotine dependence: Secondary | ICD-10-CM | POA: Insufficient documentation

## 2019-02-24 DIAGNOSIS — C7800 Secondary malignant neoplasm of unspecified lung: Secondary | ICD-10-CM | POA: Diagnosis not present

## 2019-02-24 DIAGNOSIS — D631 Anemia in chronic kidney disease: Secondary | ICD-10-CM | POA: Insufficient documentation

## 2019-02-24 DIAGNOSIS — C50919 Malignant neoplasm of unspecified site of unspecified female breast: Secondary | ICD-10-CM | POA: Diagnosis not present

## 2019-02-24 DIAGNOSIS — E7800 Pure hypercholesterolemia, unspecified: Secondary | ICD-10-CM | POA: Diagnosis not present

## 2019-02-24 DIAGNOSIS — Z79899 Other long term (current) drug therapy: Secondary | ICD-10-CM | POA: Diagnosis not present

## 2019-02-24 HISTORY — DX: Anxiety disorder, unspecified: F41.9

## 2019-02-24 HISTORY — PX: CATARACT EXTRACTION W/PHACO: SHX586

## 2019-02-24 LAB — GLUCOSE, CAPILLARY
Glucose-Capillary: 244 mg/dL — ABNORMAL HIGH (ref 70–99)
Glucose-Capillary: 273 mg/dL — ABNORMAL HIGH (ref 70–99)

## 2019-02-24 SURGERY — PHACOEMULSIFICATION, CATARACT, WITH IOL INSERTION
Anesthesia: Monitor Anesthesia Care | Site: Eye | Laterality: Right

## 2019-02-24 MED ORDER — CARBACHOL 0.01 % IO SOLN
INTRAOCULAR | Status: DC | PRN
  Administered 2019-02-24: 0.5 mL via INTRAOCULAR

## 2019-02-24 MED ORDER — INSULIN ASPART 100 UNIT/ML ~~LOC~~ SOLN
7.0000 [IU] | Freq: Once | SUBCUTANEOUS | Status: AC
  Administered 2019-02-24: 07:00:00 7 [IU] via SUBCUTANEOUS

## 2019-02-24 MED ORDER — MIDAZOLAM HCL 2 MG/2ML IJ SOLN
INTRAMUSCULAR | Status: AC
  Filled 2019-02-24: qty 2

## 2019-02-24 MED ORDER — MOXIFLOXACIN HCL 0.5 % OP SOLN
OPHTHALMIC | Status: DC | PRN
  Administered 2019-02-24: 0.2 mL via OPHTHALMIC

## 2019-02-24 MED ORDER — LIDOCAINE HCL (PF) 4 % IJ SOLN
INTRAOCULAR | Status: DC | PRN
  Administered 2019-02-24: 4 mL via OPHTHALMIC

## 2019-02-24 MED ORDER — MOXIFLOXACIN HCL 0.5 % OP SOLN
OPHTHALMIC | Status: AC
  Filled 2019-02-24: qty 3

## 2019-02-24 MED ORDER — SODIUM CHLORIDE 0.9 % IV SOLN
INTRAVENOUS | Status: DC
  Administered 2019-02-24: 09:00:00 via INTRAVENOUS

## 2019-02-24 MED ORDER — NA CHONDROIT SULF-NA HYALURON 40-17 MG/ML IO SOLN
INTRAOCULAR | Status: DC | PRN
  Administered 2019-02-24: 1 mL via INTRAOCULAR

## 2019-02-24 MED ORDER — EPINEPHRINE PF 1 MG/ML IJ SOLN
INTRAOCULAR | Status: DC | PRN
  Administered 2019-02-24: 1 mL via OPHTHALMIC

## 2019-02-24 MED ORDER — NA HYALUR & NA CHOND-NA HYALUR 0.55-0.5 ML IO KIT
PACK | INTRAOCULAR | Status: DC | PRN
  Administered 2019-02-24: 1 via OPHTHALMIC

## 2019-02-24 MED ORDER — ARMC OPHTHALMIC DILATING DROPS
OPHTHALMIC | Status: AC
  Administered 2019-02-24: 1 via OPHTHALMIC
  Filled 2019-02-24: qty 0.5

## 2019-02-24 MED ORDER — MOXIFLOXACIN HCL 0.5 % OP SOLN: 1.0000 [drp] | OPHTHALMIC | Status: DC | PRN

## 2019-02-24 MED ORDER — TRYPAN BLUE 0.06 % OP SOLN
OPHTHALMIC | Status: DC | PRN
  Administered 2019-02-24: 0.5 mL via INTRAOCULAR

## 2019-02-24 MED ORDER — TETRACAINE HCL 0.5 % OP SOLN
OPHTHALMIC | Status: AC
  Administered 2019-02-24: 09:00:00 1 [drp] via OPHTHALMIC
  Filled 2019-02-24: qty 4

## 2019-02-24 MED ORDER — DEXMEDETOMIDINE HCL 200 MCG/2ML IV SOLN
INTRAVENOUS | Status: DC | PRN
  Administered 2019-02-24: 8 ug via INTRAVENOUS
  Administered 2019-02-24: 4 ug via INTRAVENOUS

## 2019-02-24 MED ORDER — ARMC OPHTHALMIC DILATING DROPS
1.0000 "application " | OPHTHALMIC | Status: AC
  Administered 2019-02-24 (×3): 1 via OPHTHALMIC

## 2019-02-24 MED ORDER — INSULIN ASPART 100 UNIT/ML ~~LOC~~ SOLN
SUBCUTANEOUS | Status: AC
  Administered 2019-02-24: 7 [IU] via SUBCUTANEOUS
  Filled 2019-02-24: qty 1

## 2019-02-24 MED ORDER — POVIDONE-IODINE 5 % OP SOLN
OPHTHALMIC | Status: DC | PRN
  Administered 2019-02-24: 1 via OPHTHALMIC

## 2019-02-24 MED ORDER — TETRACAINE HCL 0.5 % OP SOLN
1.0000 [drp] | OPHTHALMIC | Status: DC | PRN
  Administered 2019-02-24 (×2): 1 [drp] via OPHTHALMIC

## 2019-02-24 MED ORDER — MIDAZOLAM HCL 2 MG/2ML IJ SOLN
INTRAMUSCULAR | Status: DC | PRN
  Administered 2019-02-24 (×2): 1 mg via INTRAVENOUS

## 2019-02-24 SURGICAL SUPPLY — 18 items
DISSECTOR HYDRO NUCLEUS 50X22 (MISCELLANEOUS) ×8 IMPLANT
DRSG TEGADERM 2-3/8X2-3/4 SM (GAUZE/BANDAGES/DRESSINGS) ×2 IMPLANT
GLOVE BIOGEL M 6.5 STRL (GLOVE) ×2 IMPLANT
GOWN STRL REUS W/ TWL LRG LVL3 (GOWN DISPOSABLE) ×1 IMPLANT
GOWN STRL REUS W/ TWL XL LVL3 (GOWN DISPOSABLE) ×1 IMPLANT
GOWN STRL REUS W/TWL LRG LVL3 (GOWN DISPOSABLE) ×1
GOWN STRL REUS W/TWL XL LVL3 (GOWN DISPOSABLE) ×1
KNIFE 45D UP 2.3 (MISCELLANEOUS) ×2 IMPLANT
LABEL CATARACT MEDS ST (LABEL) ×2 IMPLANT
LENS IOL ACRSF IQ ULTRA 17.5 (Intraocular Lens) ×1 IMPLANT
LENS IOL ACRYSOF IQ 17.5 (Intraocular Lens) ×2 IMPLANT
PACK CATARACT (MISCELLANEOUS) ×2 IMPLANT
PACK CATARACT KING (MISCELLANEOUS) ×2 IMPLANT
PACK EYE AFTER SURG (MISCELLANEOUS) ×2 IMPLANT
SOL BSS BAG (MISCELLANEOUS) ×2
SOLUTION BSS BAG (MISCELLANEOUS) ×1 IMPLANT
WATER STERILE IRR 250ML POUR (IV SOLUTION) ×2 IMPLANT
WIPE NON LINTING 3.25X3.25 (MISCELLANEOUS) ×2 IMPLANT

## 2019-02-24 NOTE — Discharge Instructions (Signed)
Eye Surgery Discharge Instructions  Expect mild scratchy sensation or mild soreness. DO NOT RUB YOUR EYE!  The day of surgery:  Minimal physical activity, but bed rest is not required  No reading, computer work, or close hand work  No bending, lifting, or straining.  May watch TV  For 24 hours:  No driving, legal decisions, or alcoholic beverages  Safety precautions  Eat anything you prefer: It is better to start with liquids, then soup then solid foods.  Solar shield eyeglasses should be worn for comfort in the sunlight/patch while sleeping  Resume all regular medications including aspirin or Coumadin if these were discontinued prior to surgery. You may shower, bathe, shave, or wash your hair. Tylenol may be taken for mild discomfort. Follow eye drop instruction sheet as reviewed.  Call your doctor if you experience significant pain, nausea, or vomiting, fever > 101 or other signs of infection. [phone] or [phone] Specific instructions:  Follow-up Information    Marchia Meiers, MD Follow up.   Specialty: Ophthalmology Contact information: [address] [place] [address] [phone]

## 2019-02-24 NOTE — Transfer of Care (Signed)
Immediate Anesthesia Transfer of Care Note  Patient: [name]  Procedure(s) Performed: CATARACT EXTRACTION PHACO AND INTRAOCULAR LENS PLACEMENT (IOC) RIGHT DIABETES (Right Eye)  Patient Location: Short Stay  Anesthesia Type:MAC  Level of Consciousness: awake, alert , oriented and patient cooperative  Airway & Oxygen Therapy: Patient Spontanous Breathing  Post-op Assessment: Report given to RN and Post -op Vital signs reviewed and stable  Post vital signs: Reviewed and stable  Last Vitals:  Vitals Value Taken Time  BP 117/75 02/24/19 1025  Temp 36.3 C 02/24/19 1025  Pulse 69 02/24/19 1025  Resp 16 02/24/19 1025  SpO2 99 % 02/24/19 1025    Last Pain:  Vitals:   02/24/19 1025  TempSrc: Temporal  PainSc: 0-No pain         Complications: No apparent anesthesia complications

## 2019-02-24 NOTE — Op Note (Signed)
  PREOPERATIVE DIAGNOSIS:  Nuclear sclerotic cataract of the RIGHT eye.   POSTOPERATIVE DIAGNOSIS:  Nuclear sclerotic cataract of the RIGHT eye.   OPERATIVE PROCEDURE: Cataract surgery OD   SURGEON:  Marchia Meiers, MD.   ANESTHESIA:  Anesthesiologist: Gunnar Fusi, MD CRNA: Eben Burow, CRNA  1.      Managed anesthesia care. 2.     0.69ml of Shugarcaine was instilled following the paracentesis   COMPLICATIONS:  None.   TECHNIQUE:   Divide and conquer   DESCRIPTION OF PROCEDURE:  The patient was examined and consented in the preoperative holding area where the aforementioned topical anesthesia was applied to the RIGHT eye and then brought back to the Operating Room where the RIGHT eye was prepped and draped in the usual sterile ophthalmic fashion and a lid speculum was placed. A paracentesis was created with the side port blade, the anterior chamber was washed out with trypan blue to stain the anterior capsule, and the anterior chamber was filled with viscoelastic. A near clear corneal incision was performed with the steel keratome. A continuous curvilinear capsulorrhexis was performed with a cystotome followed by the capsulorrhexis forceps. Hydrodissection and hydrodelineation were carried out with BSS on a blunt cannula. The lens was removed in a divide and conquer  technique and the remaining cortical material was removed with the irrigation-aspiration handpiece. The capsular bag was inflated with viscoelastic and the lens was placed in the capsular bag without complication. The remaining viscoelastic was removed from the eye with the irrigation-aspiration handpiece. The wounds were hydrated. The anterior chamber was flushed and the eye was inflated to physiologic pressure. 0.67ml Vigamox was placed in the anterior chamber. The wounds were found to be water tight. The eye was dressed with Vigamox. The patient was given protective glasses to wear throughout the day and a shield with which  to sleep tonight. The patient was also given drops with which to begin a drop regimen today and will follow-up with me in one day. Implant Name Type Inv. Item Serial No. Manufacturer Lot No. LRB No. Used Action  LENS IOL ACRYSOF IQ 17.5 - [id number] Intraocular Lens LENS IOL ACRYSOF IQ 17.5 [id number] ALCON  Right 1 Implanted    Procedure(s) with comments: CATARACT EXTRACTION PHACO AND INTRAOCULAR LENS PLACEMENT (IOC) RIGHT DIABETES (Right) - [place] 01:13.0 CDE 7.96 Fluid Pack Lot # [id number]  Electronically signed: Marchia Meiers 02/24/2019 2:20 PM

## 2019-02-24 NOTE — Anesthesia Preprocedure Evaluation (Signed)
Anesthesia Evaluation  Patient identified by MRN, date of birth, ID band Patient awake    Reviewed: Allergy & Precautions, NPO status , Patient's Chart, lab work & pertinent test results, reviewed documented beta blocker date and time   History of Anesthesia Complications Negative for: history of anesthetic complications  Airway Mallampati: III       Dental   Pulmonary asthma , neg sleep apnea, neg COPD, former smoker,           Cardiovascular hypertension, Pt. on medications and Pt. on home beta blockers +CHF  (-) Past MI (-) dysrhythmias (-) Valvular Problems/Murmurs     Neuro/Psych neg Seizures Anxiety Depression CVA (L sided weakness, balance problems), Residual Symptoms    GI/Hepatic neg GERD  ,  Endo/Other  diabetes, Type 2, Oral Hypoglycemic Agents  Renal/GU CRFRenal disease     Musculoskeletal   Abdominal   Peds  Hematology   Anesthesia Other Findings   Reproductive/Obstetrics                             Anesthesia Physical Anesthesia Plan  ASA: III  Anesthesia Plan: MAC   Post-op Pain Management:    Induction:   PONV Risk Score and Plan:   Airway Management Planned: Nasal Cannula  Additional Equipment:   Intra-op Plan:   Post-operative Plan:   Informed Consent: I have reviewed the patients History and Physical, chart, labs and discussed the procedure including the risks, benefits and alternatives for the proposed anesthesia with the patient or authorized representative who has indicated his/her understanding and acceptance.       Plan Discussed with:   Anesthesia Plan Comments:         Anesthesia Quick Evaluation

## 2019-02-24 NOTE — Anesthesia Post-op Follow-up Note (Signed)
Anesthesia QCDR form completed.        

## 2019-02-24 NOTE — Anesthesia Postprocedure Evaluation (Signed)
Anesthesia Post Note  Patient: [name]  Procedure(s) Performed: CATARACT EXTRACTION PHACO AND INTRAOCULAR LENS PLACEMENT (IOC) RIGHT DIABETES (Right Eye)  Patient location during evaluation: Short Stay Anesthesia Type: MAC Level of consciousness: awake and alert and oriented Pain management: satisfactory to patient Vital Signs Assessment: post-procedure vital signs reviewed and stable Respiratory status: spontaneous breathing, nonlabored ventilation and respiratory function stable Cardiovascular status: stable Postop Assessment: no headache and no apparent nausea or vomiting Anesthetic complications: no     Last Vitals:  Vitals:   02/24/19 0838 02/24/19 1025  BP: (!) 157/90 117/75  Pulse: 72 69  Resp: 16 16  Temp: 37.1 C (!) 36.3 C  SpO2: 100% 99%    Last Pain:  Vitals:   02/24/19 1025  TempSrc: Temporal  PainSc: 0-No pain                 Eben Burow

## 2019-02-24 NOTE — H&P (Signed)
   I have reviewed the patient's H&P and agree with its findings. There have been no interval changes.  Jeselle Hiser MD Ophthalmology 

## 2019-03-03 ENCOUNTER — Other Ambulatory Visit: Payer: Self-pay

## 2019-03-04 ENCOUNTER — Inpatient Hospital Stay: Payer: Medicare HMO

## 2019-03-04 ENCOUNTER — Encounter: Payer: Self-pay | Admitting: Internal Medicine

## 2019-03-04 ENCOUNTER — Inpatient Hospital Stay (HOSPITAL_BASED_OUTPATIENT_CLINIC_OR_DEPARTMENT_OTHER): Payer: Medicare HMO | Admitting: Internal Medicine

## 2019-03-04 ENCOUNTER — Other Ambulatory Visit: Payer: Self-pay

## 2019-03-04 VITALS — BP 147/79 | HR 71

## 2019-03-04 DIAGNOSIS — Z170 Estrogen receptor positive status [ER+]: Secondary | ICD-10-CM

## 2019-03-04 DIAGNOSIS — D649 Anemia, unspecified: Secondary | ICD-10-CM

## 2019-03-04 DIAGNOSIS — Z5111 Encounter for antineoplastic chemotherapy: Secondary | ICD-10-CM | POA: Diagnosis not present

## 2019-03-04 DIAGNOSIS — Z794 Long term (current) use of insulin: Secondary | ICD-10-CM

## 2019-03-04 DIAGNOSIS — F419 Anxiety disorder, unspecified: Secondary | ICD-10-CM | POA: Diagnosis not present

## 2019-03-04 DIAGNOSIS — C50212 Malignant neoplasm of upper-inner quadrant of left female breast: Secondary | ICD-10-CM

## 2019-03-04 DIAGNOSIS — F329 Major depressive disorder, single episode, unspecified: Secondary | ICD-10-CM

## 2019-03-04 DIAGNOSIS — Z87891 Personal history of nicotine dependence: Secondary | ICD-10-CM

## 2019-03-04 DIAGNOSIS — E119 Type 2 diabetes mellitus without complications: Secondary | ICD-10-CM | POA: Diagnosis not present

## 2019-03-04 DIAGNOSIS — J45909 Unspecified asthma, uncomplicated: Secondary | ICD-10-CM

## 2019-03-04 DIAGNOSIS — Z79899 Other long term (current) drug therapy: Secondary | ICD-10-CM

## 2019-03-04 DIAGNOSIS — Z7982 Long term (current) use of aspirin: Secondary | ICD-10-CM

## 2019-03-04 DIAGNOSIS — N184 Chronic kidney disease, stage 4 (severe): Secondary | ICD-10-CM

## 2019-03-04 DIAGNOSIS — Z7951 Long term (current) use of inhaled steroids: Secondary | ICD-10-CM

## 2019-03-04 DIAGNOSIS — Z7189 Other specified counseling: Secondary | ICD-10-CM

## 2019-03-04 DIAGNOSIS — Z803 Family history of malignant neoplasm of breast: Secondary | ICD-10-CM

## 2019-03-04 LAB — CBC WITH DIFFERENTIAL/PLATELET
Abs Immature Granulocytes: 0.11 10*3/uL — ABNORMAL HIGH (ref 0.00–0.07)
Basophils Absolute: 0 10*3/uL (ref 0.0–0.1)
Basophils Relative: 0 %
Eosinophils Absolute: 0.1 10*3/uL (ref 0.0–0.5)
Eosinophils Relative: 1 %
HCT: 31.2 % — ABNORMAL LOW (ref 36.0–46.0)
Hemoglobin: 10.7 g/dL — ABNORMAL LOW (ref 12.0–15.0)
Immature Granulocytes: 1 %
Lymphocytes Relative: 11 %
Lymphs Abs: 1.1 10*3/uL (ref 0.7–4.0)
MCH: 30.5 pg (ref 26.0–34.0)
MCHC: 34.3 g/dL (ref 30.0–36.0)
MCV: 88.9 fL (ref 80.0–100.0)
Monocytes Absolute: 0.8 10*3/uL (ref 0.1–1.0)
Monocytes Relative: 8 %
Neutro Abs: 7.9 10*3/uL — ABNORMAL HIGH (ref 1.7–7.7)
Neutrophils Relative %: 79 %
Platelets: 273 10*3/uL (ref 150–400)
RBC: 3.51 MIL/uL — ABNORMAL LOW (ref 3.87–5.11)
RDW: 12.4 % (ref 11.5–15.5)
WBC: 10 10*3/uL (ref 4.0–10.5)
nRBC: 0 % (ref 0.0–0.2)

## 2019-03-04 LAB — COMPREHENSIVE METABOLIC PANEL WITH GFR
ALT: 17 U/L (ref 0–44)
AST: 19 U/L (ref 15–41)
Albumin: 3.9 g/dL (ref 3.5–5.0)
Alkaline Phosphatase: 75 U/L (ref 38–126)
Anion gap: 9 (ref 5–15)
BUN: 29 mg/dL — ABNORMAL HIGH (ref 8–23)
CO2: 23 mmol/L (ref 22–32)
Calcium: 8.4 mg/dL — ABNORMAL LOW (ref 8.9–10.3)
Chloride: 104 mmol/L (ref 98–111)
Creatinine, Ser: 2.9 mg/dL — ABNORMAL HIGH (ref 0.44–1.00)
GFR calc Af Amer: 19 mL/min — ABNORMAL LOW
GFR calc non Af Amer: 17 mL/min — ABNORMAL LOW
Glucose, Bld: 101 mg/dL — ABNORMAL HIGH (ref 70–99)
Potassium: 3.9 mmol/L (ref 3.5–5.1)
Sodium: 136 mmol/L (ref 135–145)
Total Bilirubin: 0.5 mg/dL (ref 0.3–1.2)
Total Protein: 7.4 g/dL (ref 6.5–8.1)

## 2019-03-04 MED ORDER — DEXAMETHASONE SODIUM PHOSPHATE 10 MG/ML IJ SOLN
4.0000 mg | Freq: Once | INTRAMUSCULAR | Status: AC
  Administered 2019-03-04: 4 mg via INTRAVENOUS
  Filled 2019-03-04: qty 1

## 2019-03-04 MED ORDER — SODIUM CHLORIDE 0.9% FLUSH
10.0000 mL | Freq: Once | INTRAVENOUS | Status: AC
  Administered 2019-03-04: 08:00:00 10 mL via INTRAVENOUS
  Filled 2019-03-04: qty 10

## 2019-03-04 MED ORDER — DIPHENHYDRAMINE HCL 50 MG/ML IJ SOLN
50.0000 mg | Freq: Once | INTRAMUSCULAR | Status: AC
  Administered 2019-03-04: 10:00:00 50 mg via INTRAVENOUS
  Filled 2019-03-04: qty 1

## 2019-03-04 MED ORDER — FAMOTIDINE IN NACL 20-0.9 MG/50ML-% IV SOLN
20.0000 mg | Freq: Once | INTRAVENOUS | Status: AC
  Administered 2019-03-04: 10:00:00 20 mg via INTRAVENOUS
  Filled 2019-03-04: qty 50

## 2019-03-04 MED ORDER — SODIUM CHLORIDE 0.9 % IV SOLN
Freq: Once | INTRAVENOUS | Status: AC
  Administered 2019-03-04: 09:00:00 via INTRAVENOUS
  Filled 2019-03-04: qty 250

## 2019-03-04 MED ORDER — HEPARIN SOD (PORK) LOCK FLUSH 100 UNIT/ML IV SOLN
500.0000 [IU] | Freq: Once | INTRAVENOUS | Status: AC | PRN
  Administered 2019-03-04: 12:00:00 500 [IU]
  Filled 2019-03-04: qty 5

## 2019-03-04 MED ORDER — SODIUM CHLORIDE 0.9 % IV SOLN
60.0000 mg/m2 | Freq: Once | INTRAVENOUS | Status: AC
  Administered 2019-03-04: 11:00:00 114 mg via INTRAVENOUS
  Filled 2019-03-04: qty 19

## 2019-03-04 NOTE — Assessment & Plan Note (Addendum)
Left breast cancer- stage IV- ER/PR +, HER-2/neu - on Taxol chemotherapy; PET May 31st 2020- stable bone lesions/left breast mass; decreasing effusions/left lung atelectasis; right pelvic LN- improved.  Clinically STABLE; TM-80s.    # Proceed with Taxol every 2 weeks. Labs today reviewed;  acceptable for treatment today.  # cataracts surgery- on July 24th- at [facility] eye.   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    # Bil LE swelling/ededma MILD- discussed re: compression stocking/ leg- stable.  # Chronic kidney disease - stage IV-creat-2.9 stable/Dr.Kolluru  # Anemia- hemoglobin today-9-10-stable;   #  DISPOSITION:  # Treatment today # follow up in in 2 weeks-] labs/MD-cbc/cmp-ca-27-29; Taxol;- Dr.B

## 2019-03-04 NOTE — Progress Notes (Signed)
[place] OFFICE PROGRESS NOTE  Patient Care Team: [name], [name], MD as Medical Oncologist (Medical Oncology)  Cancer Staging No matching staging information was found for the patient.   Oncology History Overview Note  # OCT 2015-STAGE IV LEFT BREAST T2N1 [T=4cm; [name]; [name]; [name]; EBUS- Positive Paratrac/subcarinal LN s/p ? Taxotere [in [place]; [name]; [name];[name]; [name]; suv 3]; [name]; [name]; [name];  [name]; Ant R iliac bone; [name]; [name]; START-Taxol weekly. March 2020- Taxol every 2 weeks [PN]  # Poorly controlled Blood sugars- improved.   # Pancreatitis Hx/PEI- on creon in past / CKD IV [creat ~ 3-4; [name]; [name]; mild left sided weakness]  # Jan 2020-  Lobular lesion on tongue- s/p excision pyogenic granuloma [Dr.McQueen]   # GENETIC TESTING/COUNSELLING: HETEROZYGOUS Cystic Fibrosis Gene [explains hx of recurrent pancreatitis]  # MOLECULAR TESTING: NA  ------------------------------------------------   DIAGNOSIS: [ 2015] BREAST CA; [name]; [name] ;GOALS: Palliative  CURRENT/MOST RECENT THERAPY [ April 2019] TAXOL    Carcinoma of upper-inner quadrant of left breast in female, estrogen receptor positive ([place])      INTERVAL HISTORY:  [name] 63 y.o.  female pleasant patient above history of ER PR positive HER-2 negative breast cancer on Taxol is  here for follow-up.  In the interim patient had cataract surgery; uneventful.  Patient awaiting to have cataract surgery on the other side August 20.  Patient appetite is good.  No weight loss.  No nausea vomiting.  Mild swelling in the legs.  Mild tingling and numbness.  Not any worse   Review of Systems  Constitutional: Positive for malaise/fatigue. Negative for chills, diaphoresis, [name], hemoptysis, sputum production, shortness of breath and wheezing.   Cardiovascular: Positive for leg swelling. Negative for chest pain, palpitations and orthopnea.  Gastrointestinal: Negative for abdominal pain, blood in stool, constipation, diarrhea, heartburn, melena, nausea and vomiting.  Genitourinary: Negative for dysuria, frequency and urgency.  Musculoskeletal: Negative for back pain and joint pain.  Skin: Negative.  Negative for itching and rash.  Neurological: Positive for tingling. Negative for dizziness, focal weakness, weakness and headaches.  Endo/Heme/Allergies: Does not bruise/bleed easily.  Psychiatric/Behavioral: Negative for depression. The patient is not nervous/anxious and does not have insomnia.       PAST MEDICAL HISTORY :  Past Medical History:  Diagnosis Date  . Anemia   . Anxiety   . Asthma   . Cancer ([place]) 03/10/2018   Per [facility] PET order. Carcinoma of upper-inner quadrant of left breast in female, estrogen receptor positive .  Marland Kitchen Cancer (HCC)    LUNG  . CHF (congestive heart failure) ([place]) 1997  . CKD (chronic kidney disease)   . Depression   . Diabetes mellitus, type 2 ([place])   . Family history of breast cancer   . Family history  of colon cancer   . Family history of ovarian cancer   . Family history of pancreatic cancer   . Family history of prostate cancer   . Family history of stomach cancer   . Hair loss   . History of left  breast cancer 05/29/14  . History of partial hysterectomy 12/31/2016   Per patient.  Has not had a period in years.  Had a partial hysterectomy years ago.  Marland Kitchen Hypertension   . Obesity   . Pancreatitis 1997  . Stroke [facility]) 2010   with mild left arm weakness    PAST SURGICAL HISTORY :   Past Surgical History:  Procedure Laterality Date  . CATARACT EXTRACTION W/PHACO Right 02/24/2019   Procedure: CATARACT EXTRACTION PHACO AND INTRAOCULAR LENS PLACEMENT ([place]) RIGHT DIABETES;  Surgeon: Marchia Meiers, MD;  Location: [facility] ORS;  Service: Ophthalmology;  Laterality: Right;  [place] 01:13.0 CDE 7.96 Fluid Pack Lot # [id number]  . CESAREAN SECTION    . CHOLECYSTECTOMY    . EXCISION OF TONGUE LESION N/A 08/17/2018   Procedure: EXCISION OF TONGUE LESION WITH FROZEN SECTIONS;  Surgeon: Beverly Gust, MD;  Location: [facility] ORS;  Service: ENT;  Laterality: N/A;  . PARTIAL HYSTERECTOMY  12/31/2016   Per patient, she has not had a period in years since she had a partial hysterectomy.  Marland Kitchen PORTA CATH INSERTION    . TUBAL LIGATION      FAMILY HISTORY :   Family History  Problem Relation Age of Onset  . Ovarian cancer Mother 88  . Diabetes Mother   . Hypertension Mother   . COPD Father   . Hypertension Father   . Colon cancer Father 38  . Diabetes Sister   . Breast cancer Sister 20       bilateral  . Diabetes Brother   . Leukemia Maternal Aunt   . Pancreatic cancer Paternal Aunt 80  . Pancreatic cancer Paternal Uncle   . Colon cancer Paternal Uncle   . Stomach cancer Maternal Grandfather 98  . Throat cancer Paternal Grandmother   . Breast cancer Maternal Aunt 80  . Colon cancer Maternal Aunt   . Bone cancer Maternal Aunt   . Breast cancer Paternal Aunt        dx >50  . Prostate cancer Paternal Uncle   . Pancreatic cancer Paternal Uncle   . Throat cancer Paternal Uncle   . Lung cancer Paternal Uncle   . Stomach cancer Paternal Uncle   . Brain cancer Paternal Aunt   . Cancer Cousin         liver, kidney  . Prostate cancer Cousin        meastatic  . Lung cancer Other     SOCIAL HISTORY:   Social History   Tobacco Use  . Smoking status: Former Smoker    Packs/day: 0.50    Years: 1.00    Pack years: 0.50    Types: Cigarettes  . Smokeless tobacco: Never Used  Substance Use Topics  . Alcohol use: No    Alcohol/week: 0.0 standard drinks  . Drug use: No    ALLERGIES:  has No Known Allergies.  MEDICATIONS:  Current Outpatient Medications  Medication Sig Dispense Refill  . acetaminophen-codeine (TYLENOL #4) 300-60 MG tablet Take 1-2 tablets by mouth every 4 (four) hours as needed for moderate pain.     Marland Kitchen albuterol (PROAIR HFA) 108 (90 BASE) MCG/ACT inhaler Inhale 2 puffs into the lungs every 6 (six) hours as needed for  wheezing or shortness of breath.     Marland Kitchen albuterol (PROVENTIL) (2.5 MG/3ML) 0.083% nebulizer solution Inhale 2.5 mg into the lungs every 6 (six) hours as needed for shortness of breath.     . ALPRAZolam (XANAX) 0.5 MG tablet Take 0.5 mg by mouth at bedtime as needed for anxiety or sleep.     Mar[place] Kitchen amLODipine (NORVASC) 10 MG tablet Take 10 mg by mouth daily.     Marland Kitchen aspirin EC 81 MG tablet Take 81 mg by mouth daily.     Marland Kitchen atenolol (TENORMIN) 100 MG tablet Take 100 mg by mouth 2 (two) times daily.     . B-D ULTRA-FINE 33 LANCETS MISC Use 1 each 2 (two) times daily.    . B-D ULTRAFINE III SHORT PEN 31G X 8 MM MISC     . bumetanide (BUMEX) 0.5 MG tablet Take 0.5 mg by mouth 2 (two) times daily.     . calcitRIOL (ROCALTROL) 0.25 MCG capsule Take 0.25 mcg by mouth every Monday, Wednesday, and Friday.     . Cinnamon 500 MG capsule Take 1,000 mg by mouth daily.     . cloNIDine (CATAPRES) 0.2 MG tablet Take 0.2 mg by mouth 2 (two) times daily.     . enalapril (VASOTEC) 10 MG tablet Take 20 mg by mouth 2 (two) times a day.     . famotidine (PEPCID) 20 MG tablet Take 20 mg by mouth 2 (two) times daily.    . ferrous sulfate 325 (65 FE) MG tablet Take 325 mg by mouth 2 (two)  times daily with a meal.    . FLUoxetine (PROZAC) 20 MG capsule Take 20 mg by mouth 2 (two) times daily.     . Fluticasone Propionate, Inhal, (FLOVENT DISKUS) 100 MCG/BLIST AEPB Inhale 2 puffs into the lungs 2 (two) times a day. Inhale 2 inhalations into the lungs 2 times daily PRN     . gabapentin (NEURONTIN) 100 MG capsule TAKE 1 CAPSULE(100 MG) BY MOUTH AT BEDTIME (Patient taking differently: Take 100 mg by mouth at bedtime. ) 30 capsule 6  . glucose blood (ONE TOUCH ULTRA TEST) test strip Use 1 each 2 (two) times daily. Use as instructed.    . glyBURIDE (DIABETA) 5 MG tablet Take 10 mg by mouth 2 (two) times daily with a meal.     . LEVEMIR FLEXTOUCH 100 UNIT/ML Pen Inject 55 Units into the skin daily.     Marland Kitchen loperamide (IMODIUM A-D) 2 MG capsule Take 2-4 mg by mouth 4 (four) times daily as needed for diarrhea or loose stools.     . mometasone (NASONEX) 50 MCG/ACT nasal spray Place 2 sprays into the nose daily as needed (Allergies).     . NOVOLOG FLEXPEN 100 UNIT/ML FlexPen Inject 7 Units into the skin 2 (two) times daily.     . ondansetron (ZOFRAN) 8 MG tablet One pill every 8 hours as needed for nausea/vomitting. (Patient taking differently: Take 8 mg by mouth every 8 (eight) hours as needed for nausea or vomiting. ) 40 tablet 1  . prochlorperazine (COMPAZINE) 10 MG tablet Take 1 tablet (10 mg total) by mouth every 6 (six) hours as needed for nausea or vomiting. Please note change in strength (Patient taking differently: Take 10 mg by mouth every 6 (six) hours as needed for nausea or vomiting. ) 60 tablet 4  . salmeterol (SEREVENT) 50 MCG/DOSE diskus inhaler Inhale 1 puff into the lungs daily as needed (shortness of breath).     Marland Kitchen  simvastatin (ZOCOR) 20 MG tablet Take 20 mg by mouth daily at 6 PM.     . vitamin B-12 (CYANOCOBALAMIN) 1000 MCG tablet Take 1,000 mcg by mouth daily.     No current facility-administered medications for this visit.    Facility-Administered Medications Ordered in  Other Visits  Medication Dose Route Frequency Provider Last Rate Last Dose  . famotidine (PEPCID) IVPB 20 mg premix  20 mg Intravenous Once Charlaine Dalton R, MD      . heparin lock flush 100 unit/mL  500 Units Intracatheter Once PRN Cammie Sickle, MD      . PACLitaxel (TAXOL) 114 mg in sodium chloride 0.9 % 250 mL chemo infusion (</= 2m/m2)  60 mg/m2 (Treatment Plan Recorded) Intravenous Once BCharlaine DaltonR, MD      . sodium chloride flush (NS) 0.9 % injection 10 mL  10 mL Intravenous PRN BCammie Sickle MD   10 mL at 01/30/16 1054    PHYSICAL EXAMINATION: ECOG PERFORMANCE STATUS: 1 - Symptomatic but completely ambulatory  BP (!) 168/97   Pulse 69   Temp (!) 96.6 F (35.9 C) (Tympanic)   Resp 20   Ht 5' 3"  (1.6 m)   Wt 217 lb (98.4 kg)   BMI 38.44 kg/m   Filed Weights   03/04/19 0839  Weight: 217 lb (98.4 kg)    Physical Exam  Constitutional: She is oriented to person, place, and time and well-developed, well-nourished, and in no distress.  She is alone.  HENT:  Head: Normocephalic and atraumatic.  Mouth/Throat: Oropharynx is clear and moist. No oropharyngeal exudate.  Eyes: Pupils are equal, round, and reactive to light.  Neck: Normal range of motion. Neck supple.  Cardiovascular: Normal rate and regular rhythm.  Pulmonary/Chest: Breath sounds normal. No respiratory distress. She has no wheezes.  Abdominal: Soft. Bowel sounds are normal. She exhibits no distension and no mass. There is no abdominal tenderness. There is no rebound and no guarding.  Musculoskeletal: Normal range of motion.        General: Edema present. No tenderness.  Neurological: She is alert and oriented to person, place, and time.  Skin: Skin is warm.  Psychiatric: Affect normal.       LABORATORY DATA:  I have reviewed the data as listed    Component Value Date/Time   NA 136 03/04/2019 0816   NA 130 (L) 06/06/2014 1102   K 3.9 03/04/2019 0816   K 3.9 06/06/2014  1102   CL 104 03/04/2019 0816   CL 95 (L) 06/06/2014 1102   CO2 23 03/04/2019 0816   CO2 28 06/06/2014 1102   GLUCOSE 101 (H) 03/04/2019 0816   GLUCOSE 349 (H) 06/06/2014 1102   BUN 29 (H) 03/04/2019 0816   BUN 17 06/06/2014 1102   CREATININE 2.90 (H) 03/04/2019 0816   CREATININE 1.63 (H) 06/06/2014 1102   CALCIUM 8.4 (L) 03/04/2019 0816   CALCIUM 9.2 06/06/2014 1102   PROT 7.4 03/04/2019 0816   PROT 8.2 06/06/2014 1102   ALBUMIN 3.9 03/04/2019 0816   ALBUMIN 3.3 (L) 06/06/2014 1102   AST 19 03/04/2019 0816   AST 7 (L) 06/06/2014 1102   ALT 17 03/04/2019 0816   ALT 12 (L) 06/06/2014 1102   ALKPHOS 75 03/04/2019 0816   ALKPHOS 74 06/06/2014 1102   BILITOT 0.5 03/04/2019 0816   BILITOT 0.4 06/06/2014 1102   GFRNONAA 17 (L) 03/04/2019 0816   GFRNONAA 35 (L) 06/06/2014 1102   GFRAA 19 (L) 03/04/2019 02951  GFRAA 42 (L) 06/06/2014 1102    No results found for: SPEP, UPEP  Lab Results  Component Value Date   WBC 10.0 03/04/2019   NEUTROABS 7.9 (H) 03/04/2019   HGB 10.7 (L) 03/04/2019   HCT 31.2 (L) 03/04/2019   MCV 88.9 03/04/2019   PLT 273 03/04/2019      Chemistry      Component Value Date/Time   NA 136 03/04/2019 0816   NA 130 (L) 06/06/2014 1102   K 3.9 03/04/2019 0816   K 3.9 06/06/2014 1102   CL 104 03/04/2019 0816   CL 95 (L) 06/06/2014 1102   CO2 23 03/04/2019 0816   CO2 28 06/06/2014 1102   BUN 29 (H) 03/04/2019 0816   BUN 17 06/06/2014 1102   CREATININE 2.90 (H) 03/04/2019 0816   CREATININE 1.63 (H) 06/06/2014 1102      Component Value Date/Time   CALCIUM 8.4 (L) 03/04/2019 0816   CALCIUM 9.2 06/06/2014 1102   ALKPHOS 75 03/04/2019 0816   ALKPHOS 74 06/06/2014 1102   AST 19 03/04/2019 0816   AST 7 (L) 06/06/2014 1102   ALT 17 03/04/2019 0816   ALT 12 (L) 06/06/2014 1102   BILITOT 0.5 03/04/2019 0816   BILITOT 0.4 06/06/2014 1102       RADIOGRAPHIC STUDIES: I have personally reviewed the radiological images as listed and agreed with the  findings in the report. No results found.   ASSESSMENT & PLAN:  Carcinoma of upper-inner quadrant of left breast in female, estrogen receptor positive ([place]) Left breast cancer- stage IV- ER/PR +, HER-2/neu - on Taxol chemotherapy; PET May 31st 2020- stable bone lesions/left breast mass; decreasing effusions/left lung atelectasis; right pelvic LN- improved.  Clinically STABLE; TM-80s.    # Proceed with Taxol every 2 weeks. Labs today reviewed;  acceptable for treatment today.  # cataracts surgery- on July 24th- at [facility].   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    # Bil LE swelling/ededma MILD- discussed re: compression stocking/ leg- stable.  # Chronic kidney disease - stage IV-creat-2.9 stable/Dr.Kolluru  # Anemia- hemoglobin today-9-10-stable;   #  DISPOSITION:  # Treatment today # follow up in in 2 weeks-] labs/MD-cbc/cmp-ca-27-29; Taxol;- Dr.B   No orders of the defined types were placed in this encounter.  All questions were answered. The patient knows to call the clinic with any problems, questions or concerns.      Cammie Sickle, MD 03/04/2019 9:55 AM

## 2019-03-05 LAB — CANCER ANTIGEN 27.29: CA 27.29: 89.8 U/mL — ABNORMAL HIGH (ref 0.0–38.6)

## 2019-03-15 DIAGNOSIS — H2512 Age-related nuclear cataract, left eye: Secondary | ICD-10-CM | POA: Diagnosis not present

## 2019-03-16 ENCOUNTER — Encounter: Payer: Self-pay | Admitting: *Deleted

## 2019-03-17 ENCOUNTER — Other Ambulatory Visit: Payer: Self-pay

## 2019-03-18 ENCOUNTER — Inpatient Hospital Stay: Payer: Medicare HMO | Attending: Internal Medicine

## 2019-03-18 ENCOUNTER — Other Ambulatory Visit: Payer: Self-pay

## 2019-03-18 ENCOUNTER — Encounter: Payer: Self-pay | Admitting: Internal Medicine

## 2019-03-18 ENCOUNTER — Inpatient Hospital Stay: Payer: Medicare HMO

## 2019-03-18 ENCOUNTER — Inpatient Hospital Stay (HOSPITAL_BASED_OUTPATIENT_CLINIC_OR_DEPARTMENT_OTHER): Payer: Medicare HMO | Admitting: Internal Medicine

## 2019-03-18 DIAGNOSIS — N189 Chronic kidney disease, unspecified: Secondary | ICD-10-CM | POA: Insufficient documentation

## 2019-03-18 DIAGNOSIS — Z794 Long term (current) use of insulin: Secondary | ICD-10-CM | POA: Diagnosis not present

## 2019-03-18 DIAGNOSIS — E1122 Type 2 diabetes mellitus with diabetic chronic kidney disease: Secondary | ICD-10-CM | POA: Insufficient documentation

## 2019-03-18 DIAGNOSIS — I130 Hypertensive heart and chronic kidney disease with heart failure and stage 1 through stage 4 chronic kidney disease, or unspecified chronic kidney disease: Secondary | ICD-10-CM | POA: Diagnosis not present

## 2019-03-18 DIAGNOSIS — Z79899 Other long term (current) drug therapy: Secondary | ICD-10-CM | POA: Insufficient documentation

## 2019-03-18 DIAGNOSIS — Z7982 Long term (current) use of aspirin: Secondary | ICD-10-CM | POA: Insufficient documentation

## 2019-03-18 DIAGNOSIS — R5383 Other fatigue: Secondary | ICD-10-CM | POA: Diagnosis not present

## 2019-03-18 DIAGNOSIS — I509 Heart failure, unspecified: Secondary | ICD-10-CM | POA: Insufficient documentation

## 2019-03-18 DIAGNOSIS — Z170 Estrogen receptor positive status [ER+]: Secondary | ICD-10-CM

## 2019-03-18 DIAGNOSIS — D649 Anemia, unspecified: Secondary | ICD-10-CM | POA: Insufficient documentation

## 2019-03-18 DIAGNOSIS — Z87891 Personal history of nicotine dependence: Secondary | ICD-10-CM | POA: Diagnosis not present

## 2019-03-18 DIAGNOSIS — C50212 Malignant neoplasm of upper-inner quadrant of left female breast: Secondary | ICD-10-CM

## 2019-03-18 DIAGNOSIS — Z95828 Presence of other vascular implants and grafts: Secondary | ICD-10-CM

## 2019-03-18 DIAGNOSIS — R5381 Other malaise: Secondary | ICD-10-CM | POA: Insufficient documentation

## 2019-03-18 DIAGNOSIS — R600 Localized edema: Secondary | ICD-10-CM | POA: Insufficient documentation

## 2019-03-18 DIAGNOSIS — Z7951 Long term (current) use of inhaled steroids: Secondary | ICD-10-CM | POA: Insufficient documentation

## 2019-03-18 DIAGNOSIS — Z7189 Other specified counseling: Secondary | ICD-10-CM

## 2019-03-18 LAB — CBC WITH DIFFERENTIAL/PLATELET
Abs Immature Granulocytes: 0.1 10*3/uL — ABNORMAL HIGH (ref 0.00–0.07)
Basophils Absolute: 0 10*3/uL (ref 0.0–0.1)
Basophils Relative: 1 %
Eosinophils Absolute: 0.2 10*3/uL (ref 0.0–0.5)
Eosinophils Relative: 2 %
HCT: 30.1 % — ABNORMAL LOW (ref 36.0–46.0)
Hemoglobin: 10.3 g/dL — ABNORMAL LOW (ref 12.0–15.0)
Immature Granulocytes: 2 %
Lymphocytes Relative: 17 %
Lymphs Abs: 1.2 10*3/uL (ref 0.7–4.0)
MCH: 30.8 pg (ref 26.0–34.0)
MCHC: 34.2 g/dL (ref 30.0–36.0)
MCV: 90.1 fL (ref 80.0–100.0)
Monocytes Absolute: 0.8 10*3/uL (ref 0.1–1.0)
Monocytes Relative: 12 %
Neutro Abs: 4.6 10*3/uL (ref 1.7–7.7)
Neutrophils Relative %: 66 %
Platelets: 284 10*3/uL (ref 150–400)
RBC: 3.34 MIL/uL — ABNORMAL LOW (ref 3.87–5.11)
RDW: 12.7 % (ref 11.5–15.5)
WBC: 6.9 10*3/uL (ref 4.0–10.5)
nRBC: 0 % (ref 0.0–0.2)

## 2019-03-18 LAB — COMPREHENSIVE METABOLIC PANEL
ALT: 19 U/L (ref 0–44)
AST: 18 U/L (ref 15–41)
Albumin: 3.7 g/dL (ref 3.5–5.0)
Alkaline Phosphatase: 70 U/L (ref 38–126)
Anion gap: 9 (ref 5–15)
BUN: 35 mg/dL — ABNORMAL HIGH (ref 8–23)
CO2: 24 mmol/L (ref 22–32)
Calcium: 8.6 mg/dL — ABNORMAL LOW (ref 8.9–10.3)
Chloride: 103 mmol/L (ref 98–111)
Creatinine, Ser: 2.83 mg/dL — ABNORMAL HIGH (ref 0.44–1.00)
GFR calc Af Amer: 20 mL/min — ABNORMAL LOW (ref >60)
GFR calc non Af Amer: 17 mL/min — ABNORMAL LOW (ref >60)
Glucose, Bld: 88 mg/dL (ref 70–99)
Potassium: 4 mmol/L (ref 3.5–5.1)
Sodium: 136 mmol/L (ref 135–145)
Total Bilirubin: 0.6 mg/dL (ref 0.3–1.2)
Total Protein: 7.1 g/dL (ref 6.5–8.1)

## 2019-03-18 MED ORDER — DEXAMETHASONE SODIUM PHOSPHATE 10 MG/ML IJ SOLN
4.0000 mg | Freq: Once | INTRAMUSCULAR | Status: AC
  Administered 2019-03-18: 4 mg via INTRAVENOUS
  Filled 2019-03-18: qty 1

## 2019-03-18 MED ORDER — HEPARIN SOD (PORK) LOCK FLUSH 100 UNIT/ML IV SOLN
500.0000 [IU] | Freq: Once | INTRAVENOUS | Status: AC | PRN
  Administered 2019-03-18: 500 [IU]
  Filled 2019-03-18: qty 5

## 2019-03-18 MED ORDER — SODIUM CHLORIDE 0.9% FLUSH
10.0000 mL | Freq: Once | INTRAVENOUS | Status: AC
  Administered 2019-03-18: 08:00:00 10 mL via INTRAVENOUS
  Filled 2019-03-18: qty 10

## 2019-03-18 MED ORDER — SODIUM CHLORIDE 0.9 % IV SOLN
Freq: Once | INTRAVENOUS | Status: AC
  Administered 2019-03-18: 09:00:00 via INTRAVENOUS
  Filled 2019-03-18: qty 250

## 2019-03-18 MED ORDER — DIPHENHYDRAMINE HCL 50 MG/ML IJ SOLN
50.0000 mg | Freq: Once | INTRAMUSCULAR | Status: AC
  Administered 2019-03-18: 50 mg via INTRAVENOUS
  Filled 2019-03-18: qty 1

## 2019-03-18 MED ORDER — SODIUM CHLORIDE 0.9 % IV SOLN
60.0000 mg/m2 | Freq: Once | INTRAVENOUS | Status: AC
  Administered 2019-03-18: 114 mg via INTRAVENOUS
  Filled 2019-03-18: qty 19

## 2019-03-18 MED ORDER — FAMOTIDINE IN NACL 20-0.9 MG/50ML-% IV SOLN
20.0000 mg | Freq: Once | INTRAVENOUS | Status: AC
  Administered 2019-03-18: 20 mg via INTRAVENOUS
  Filled 2019-03-18: qty 50

## 2019-03-18 NOTE — Assessment & Plan Note (Addendum)
Left breast cancer- stage IV- ER/PR +, HER-2/neu - on Taxol chemotherapy; PET May 26th 2020- stable bone lesions/left breast mass; decreasing effusions/left lung atelectasis; right pelvic LN- improved.  Clinically STABLE. TM-80s.    # Proceed with Taxol every 2 weeks. Labs today reviewed;  acceptable for treatment today. Will order scan at next visit.   # cataracts surgery- on July 24th- at [facility] eye. Again planned on aug 20th.   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   # Bil LE swelling/ededma MILD- discussed re: compression stocking/ leg- stable  # Chronic kidney disease - stage IV-creat-2.83-STABLE. /Dr.Kolluru  # Anemia- hemoglobin today-9-10- STABLE.   #  DISPOSITION:  # Treatment today # follow up in in 2 weeks-] labs/MD-cbc/cmp-ca-27-29; Taxol;- Dr.B

## 2019-03-18 NOTE — Progress Notes (Signed)
[name] OFFICE PROGRESS NOTE  Patient Care Team: [name], [name], MD as Medical Oncologist (Medical Oncology) Corey Skains, MD as Consulting Physician (Cardiology)  Cancer Staging No matching staging information was found for the patient.   Oncology History Overview Note  # OCT 2015-STAGE IV LEFT BREAST T2N1 [T=4cm; [name]; [name]; [name]; EBUS- Positive Paratrac/subcarinal LN s/p ? Taxotere [in [place]; [name]; [name];[name]; [name]; suv 3]; [name]; [name]; [name];  [name]; Ant R iliac bone; [name]; [name]; START-Taxol weekly. March 2020- Taxol every 2 weeks [PN]  # Poorly controlled Blood sugars- improved.   # Pancreatitis Hx/PEI- on creon in past / CKD IV [creat ~ 3-4; [name]; [name]; mild left sided weakness]  # Jan 2020-  Lobular lesion on tongue- s/p excision pyogenic granuloma [Dr.McQueen]   # GENETIC TESTING/COUNSELLING: HETEROZYGOUS Cystic Fibrosis Gene [explains hx of recurrent pancreatitis]  # MOLECULAR TESTING: NA  ------------------------------------------------   DIAGNOSIS: [ 2015] BREAST CA; [name]; [name] ;GOALS: Palliative  CURRENT/MOST RECENT THERAPY [ April 2019] TAXOL    Carcinoma of upper-inner quadrant of left breast in female, estrogen receptor positive ([place])      INTERVAL HISTORY:  [name] 63 y.o.  female pleasant patient above  history of ER PR positive HER-2 negative breast cancer on Taxol is here for follow-up.  Patient is awaiting to have a repeat cataract surgery in August 20th.  Patient's appetite is good.  No weight loss but no nausea no vomiting.  Continues to have chronic mild swelling in the legs.  Mild tingling and numbness.  Currently stable on Neurontin pain not any worse.  No headaches.   Review of Systems  Constitutional: Positive for malaise/fatigue. Negative for chills, diaphoresis, [name], hemoptysis, sputum production, shortness of breath and wheezing.   Cardiovascular: Positive for leg swelling. Negative for chest pain, palpitations and orthopnea.  Gastrointestinal: Negative for abdominal pain, blood in stool, constipation, diarrhea, heartburn, melena, nausea and vomiting.  Genitourinary: Negative for dysuria, frequency and urgency.  Musculoskeletal: Negative for back pain and joint pain.  Skin: Negative.  Negative for itching and rash.  Neurological: Positive for tingling. Negative for dizziness, focal weakness, weakness and headaches.  Endo/Heme/Allergies: Does not bruise/bleed easily.  Psychiatric/Behavioral: Negative for depression. The patient is not nervous/anxious and does not have insomnia.       PAST MEDICAL HISTORY :  Past Medical History:  Diagnosis Date  . Anemia   . Anxiety   . Asthma   . Cancer ([place]) 03/10/2018   Per NM PET order. Carcinoma of upper-inner quadrant of left breast in female, estrogen receptor positive .  Marland Kitchen Cancer (HCC)    LUNG  . CHF (congestive heart failure) ([place]) 1997  . CKD (chronic kidney disease)   . Depression   . Diabetes mellitus, type 2 ([place])   .  Family history of breast cancer   . Family history of colon cancer   . Family history of ovarian cancer   . Family history of pancreatic cancer   . Family history of  prostate cancer   . Family history of stomach cancer   . GERD (gastroesophageal reflux disease)    history of an ulcer  . Hair loss   . History of left breast cancer 05/29/14  . History of partial hysterectomy 12/31/2016   Per patient.  Has not had a period in years.  Had a partial hysterectomy years ago.  Marland Kitchen Hypertension   . Mitral valve regurgitation   . Neuromuscular disorder (HCC)    neuropathies in hand  . Obesity   . Pancreatitis 1997  . Stroke [facility]) 2010   with mild left arm weakness    PAST SURGICAL HISTORY :   Past Surgical History:  Procedure Laterality Date  . CATARACT EXTRACTION W/PHACO Right 02/24/2019   Procedure: CATARACT EXTRACTION PHACO AND INTRAOCULAR LENS PLACEMENT ([place]) RIGHT DIABETES;  Surgeon: Marchia Meiers, MD;  Location: [facility] ORS;  Service: Ophthalmology;  Laterality: Right;  [place] 01:13.0 CDE 7.96 Fluid Pack Lot # [id number]  . CESAREAN SECTION    . CHOLECYSTECTOMY    . EXCISION OF TONGUE LESION N/A 08/17/2018   Procedure: EXCISION OF TONGUE LESION WITH FROZEN SECTIONS;  Surgeon: Beverly Gust, MD;  Location: [facility] ORS;  Service: ENT;  Laterality: N/A;  . EYE SURGERY Right    cataract extraction  . PARTIAL HYSTERECTOMY  12/31/2016   Per patient, she has not had a period in years since she had a partial hysterectomy.  Marland Kitchen PORTA CATH INSERTION    . TUBAL LIGATION      FAMILY HISTORY :   Family History  Problem Relation Age of Onset  . Ovarian cancer Mother 15  . Diabetes Mother   . Hypertension Mother   . COPD Father   . Hypertension Father   . Colon cancer Father 79  . Diabetes Sister   . Breast cancer Sister 59       bilateral  . Diabetes Brother   . Leukemia Maternal Aunt   . Pancreatic cancer Paternal Aunt 18  . Pancreatic cancer Paternal Uncle   . Colon cancer Paternal Uncle   . Stomach cancer Maternal Grandfather 51  . Throat cancer Paternal Grandmother   . Breast cancer Maternal Aunt 80  . Colon cancer Maternal Aunt   . Bone cancer  Maternal Aunt   . Breast cancer Paternal Aunt        dx >50  . Prostate cancer Paternal Uncle   . Pancreatic cancer Paternal Uncle   . Throat cancer Paternal Uncle   . Lung cancer Paternal Uncle   . Stomach cancer Paternal Uncle   . Brain cancer Paternal Aunt   . Cancer Cousin        liver, kidney  . Prostate cancer Cousin        meastatic  . Lung cancer Other     SOCIAL HISTORY:   Social History   Tobacco Use  . Smoking status: Former Smoker    Packs/day: 0.50    Years: 1.00    Pack years: 0.50    Types: Cigarettes  . Smokeless tobacco: Never Used  Substance Use Topics  . Alcohol use: No    Alcohol/week: 0.0 standard drinks  . Drug use: No    ALLERGIES:  has No Known Allergies.  MEDICATIONS:  Current Outpatient Medications  Medication Sig  Dispense Refill  . acetaminophen-codeine (TYLENOL #4) 300-60 MG tablet Take 1-2 tablets by mouth every 4 (four) hours as needed for moderate pain.     Marland Kitchen albuterol (PROAIR HFA) 108 (90 BASE) MCG/ACT inhaler Inhale 2 puffs into the lungs every 6 (six) hours as needed for wheezing or shortness of breath.     Marland Kitchen albuterol (PROVENTIL) (2.5 MG/3ML) 0.083% nebulizer solution Inhale 2.5 mg into the lungs every 6 (six) hours as needed for shortness of breath.     . ALPRAZolam (XANAX) 0.5 MG tablet Take 0.5 mg by mouth at bedtime as needed for anxiety or sleep.     Marland Kitchen amLODipine (NORVASC) 10 MG tablet Take 10 mg by mouth daily.     Marland Kitchen aspirin EC 81 MG tablet Take 81 mg by mouth daily.     Marland Kitchen atenolol (TENORMIN) 100 MG tablet Take 100 mg by mouth 2 (two) times daily.     . B-D ULTRA-FINE 33 LANCETS MISC Use 1 each 2 (two) times daily.    . B-D ULTRAFINE III SHORT PEN 31G X 8 MM MISC     . bumetanide (BUMEX) 0.5 MG tablet Take 0.5 mg by mouth 2 (two) times daily.     . calcitRIOL (ROCALTROL) 0.25 MCG capsule Take 0.25 mcg by mouth every Monday, Wednesday, and Friday.     . Cinnamon 500 MG capsule Take 1,000 mg by mouth daily.     . cloNIDine  (CATAPRES) 0.2 MG tablet Take 0.2 mg by mouth 2 (two) times daily.     . enalapril (VASOTEC) 10 MG tablet Take 20 mg by mouth 2 (two) times a day.     . famotidine (PEPCID) 20 MG tablet Take 20 mg by mouth 2 (two) times daily.    . ferrous sulfate 325 (65 FE) MG tablet Take 325 mg by mouth 2 (two) times daily with a meal.    . FLUoxetine (PROZAC) 20 MG capsule Take 20 mg by mouth 2 (two) times daily.     . Fluticasone Propionate, Inhal, (FLOVENT DISKUS) 100 MCG/BLIST AEPB Inhale 2 puffs into the lungs 2 (two) times a day. Inhale 2 inhalations into the lungs 2 times daily PRN     . gabapentin (NEURONTIN) 100 MG capsule TAKE 1 CAPSULE(100 MG) BY MOUTH AT BEDTIME (Patient taking differently: Take 100 mg by mouth at bedtime. ) 30 capsule 6  . glucose blood (ONE TOUCH ULTRA TEST) test strip Use 1 each 2 (two) times daily. Use as instructed.    . glyBURIDE (DIABETA) 5 MG tablet Take 10 mg by mouth 2 (two) times daily with a meal.     . LEVEMIR FLEXTOUCH 100 UNIT/ML Pen Inject 55 Units into the skin daily.     Marland Kitchen loperamide (IMODIUM A-D) 2 MG capsule Take 2-4 mg by mouth 4 (four) times daily as needed for diarrhea or loose stools.     . mometasone (NASONEX) 50 MCG/ACT nasal spray Place 2 sprays into the nose daily as needed (Allergies).     . NOVOLOG FLEXPEN 100 UNIT/ML FlexPen Inject 7 Units into the skin 2 (two) times daily.     . ondansetron (ZOFRAN) 8 MG tablet One pill every 8 hours as needed for nausea/vomitting. (Patient taking differently: Take 8 mg by mouth every 8 (eight) hours as needed for nausea or vomiting. ) 40 tablet 1  . prednisoLONE acetate (PRED FORTE) 1 % ophthalmic suspension Place 1 drop into the left eye 2 (two) times daily.    Marland Kitchen  prochlorperazine (COMPAZINE) 10 MG tablet Take 1 tablet (10 mg total) by mouth every 6 (six) hours as needed for nausea or vomiting. Please note change in strength (Patient taking differently: Take 10 mg by mouth every 6 (six) hours as needed for nausea or  vomiting. ) 60 tablet 4  . salmeterol (SEREVENT) 50 MCG/DOSE diskus inhaler Inhale 1 puff into the lungs daily as needed (shortness of breath).     . simvastatin (ZOCOR) 20 MG tablet Take 20 mg by mouth daily at 6 PM.     . vitamin B-12 (CYANOCOBALAMIN) 1000 MCG tablet Take 1,000 mcg by mouth daily.     No current facility-administered medications for this visit.    Facility-Administered Medications Ordered in Other Visits  Medication Dose Route Frequency Provider Last Rate Last Dose  . sodium chloride flush (NS) 0.9 % injection 10 mL  10 mL Intravenous PRN Cammie Sickle, MD   10 mL at 01/30/16 1054    PHYSICAL EXAMINATION: ECOG PERFORMANCE STATUS: 1 - Symptomatic but completely ambulatory  BP (!) 160/93   Pulse 66   Temp (!) 96.4 F (35.8 C) (Tympanic)   Resp 20   Ht _0  (1.6 m)   Wt 220 lb (99.8 kg)   BMI 38.97 kg/m   Filed Weights   03/18/19 0844  Weight: 220 lb (99.8 kg)    Physical Exam  Constitutional: She is oriented to person, place, and time and well-developed, well-nourished, and in no distress.  She is alone.  HENT:  Head: Normocephalic and atraumatic.  Mouth/Throat: Oropharynx is clear and moist. No oropharyngeal exudate.  Eyes: Pupils are equal, round, and reactive to light.  Neck: Normal range of motion. Neck supple.  Cardiovascular: Normal rate and regular rhythm.  Pulmonary/Chest: Breath sounds normal. No respiratory distress. She has no wheezes.  Abdominal: Soft. Bowel sounds are normal. She exhibits no distension and no mass. There is no abdominal tenderness. There is no rebound and no guarding.  Musculoskeletal: Normal range of motion.        General: Edema present. No tenderness.  Neurological: She is alert and oriented to person, place, and time.  Skin: Skin is warm.  Psychiatric: Affect normal.       LABORATORY DATA:  I have reviewed the data as listed    Component Value Date/Time   NA 136 03/18/2019 0822   NA 130 (L) 06/06/2014  1102   K 4.0 03/18/2019 0822   K 3.9 06/06/2014 1102   CL 103 03/18/2019 0822   CL 95 (L) 06/06/2014 1102   CO2 24 03/18/2019 0822   CO2 28 06/06/2014 1102   GLUCOSE 88 03/18/2019 0822   GLUCOSE 349 (H) 06/06/2014 1102   BUN 35 (H) 03/18/2019 0822   BUN 17 06/06/2014 1102   CREATININE 2.83 (H) 03/18/2019 0822   CREATININE 1.63 (H) 06/06/2014 1102   CALCIUM 8.6 (L) 03/18/2019 0822   CALCIUM 9.2 06/06/2014 1102   PROT 7.1 03/18/2019 0822   PROT 8.2 06/06/2014 1102   ALBUMIN 3.7 03/18/2019 0822   ALBUMIN 3.3 (L) 06/06/2014 1102   AST 18 03/18/2019 0822   AST 7 (L) 06/06/2014 1102   ALT 19 03/18/2019 0822   ALT 12 (L) 06/06/2014 1102   ALKPHOS 70 03/18/2019 0822   ALKPHOS 74 06/06/2014 1102   BILITOT 0.6 03/18/2019 0822   BILITOT 0.4 06/06/2014 1102   GFRNONAA 17 (L) 03/18/2019 0822   GFRNONAA 35 (L) 06/06/2014 1102   GFRAA 20 (L) 03/18/2019 7939  GFRAA 42 (L) 06/06/2014 1102    No results found for: SPEP, UPEP  Lab Results  Component Value Date   WBC 6.9 03/18/2019   NEUTROABS 4.6 03/18/2019   HGB 10.3 (L) 03/18/2019   HCT 30.1 (L) 03/18/2019   MCV 90.1 03/18/2019   PLT 284 03/18/2019      Chemistry      Component Value Date/Time   NA 136 03/18/2019 0822   NA 130 (L) 06/06/2014 1102   K 4.0 03/18/2019 0822   K 3.9 06/06/2014 1102   CL 103 03/18/2019 0822   CL 95 (L) 06/06/2014 1102   CO2 24 03/18/2019 0822   CO2 28 06/06/2014 1102   BUN 35 (H) 03/18/2019 0822   BUN 17 06/06/2014 1102   CREATININE 2.83 (H) 03/18/2019 0822   CREATININE 1.63 (H) 06/06/2014 1102      Component Value Date/Time   CALCIUM 8.6 (L) 03/18/2019 0822   CALCIUM 9.2 06/06/2014 1102   ALKPHOS 70 03/18/2019 0822   ALKPHOS 74 06/06/2014 1102   AST 18 03/18/2019 0822   AST 7 (L) 06/06/2014 1102   ALT 19 03/18/2019 0822   ALT 12 (L) 06/06/2014 1102   BILITOT 0.6 03/18/2019 0822   BILITOT 0.4 06/06/2014 1102       RADIOGRAPHIC STUDIES: I have personally reviewed the radiological  images as listed and agreed with the findings in the report. No results found.   ASSESSMENT & PLAN:  Carcinoma of upper-inner quadrant of left breast in female, estrogen receptor positive ([place]) Left breast cancer- stage IV- ER/PR +, HER-2/neu - on Taxol chemotherapy; PET May 26th 2020- stable bone lesions/left breast mass; decreasing effusions/left lung atelectasis; right pelvic LN- improved.  Clinically STABLE. TM-80s.    # Proceed with Taxol every 2 weeks. Labs today reviewed;  acceptable for treatment today. Will order scan at next visit.   # cataracts surgery- on July 24th- at [facility]. Again planned on aug 20th.   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   # Bil LE swelling/ededma MILD- discussed re: compression stocking/ leg- stable  # Chronic kidney disease - stage IV-creat-2.83-STABLE. /Dr.Kolluru  # Anemia- hemoglobin today-9-10- STABLE.   #  DISPOSITION:  # Treatment today # follow up in in 2 weeks-] labs/MD-cbc/cmp-ca-27-29; Taxol;- Dr.B   No orders of the defined types were placed in this encounter.  All questions were answered. The patient knows to call the clinic with any problems, questions or concerns.      Cammie Sickle, MD 03/18/2019 9:22 AM

## 2019-03-19 LAB — CANCER ANTIGEN 27.29: CA 27.29: 91.1 U/mL — ABNORMAL HIGH (ref 0.0–38.6)

## 2019-03-21 ENCOUNTER — Other Ambulatory Visit
Admission: RE | Admit: 2019-03-21 | Discharge: 2019-03-21 | Disposition: A | Discharge status: Home / Self Care | Payer: Medicare HMO | Source: Ambulatory Visit | Attending: Ophthalmology | Admitting: Ophthalmology

## 2019-03-21 ENCOUNTER — Other Ambulatory Visit: Payer: Self-pay

## 2019-03-21 DIAGNOSIS — Z20828 Contact with and (suspected) exposure to other viral communicable diseases: Secondary | ICD-10-CM | POA: Insufficient documentation

## 2019-03-21 DIAGNOSIS — H2512 Age-related nuclear cataract, left eye: Secondary | ICD-10-CM | POA: Insufficient documentation

## 2019-03-21 DIAGNOSIS — Z01812 Encounter for preprocedural laboratory examination: Secondary | ICD-10-CM | POA: Diagnosis not present

## 2019-03-21 LAB — SARS CORONAVIRUS 2 (TAT 6-24 HRS): SARS Coronavirus 2: NEGATIVE

## 2019-03-24 ENCOUNTER — Ambulatory Visit: Payer: Medicare HMO | Admitting: Certified Registered Nurse Anesthetist

## 2019-03-24 ENCOUNTER — Encounter: Payer: Self-pay | Admitting: *Deleted

## 2019-03-24 ENCOUNTER — Encounter
Admission: RE | Disposition: A | Discharge status: Home / Self Care | Payer: Self-pay | Source: Home / Self Care | Attending: Ophthalmology

## 2019-03-24 ENCOUNTER — Other Ambulatory Visit: Payer: Self-pay

## 2019-03-24 ENCOUNTER — Ambulatory Visit
Admission: RE | Admit: 2019-03-24 | Discharge: 2019-03-24 | Disposition: A | Discharge status: Home / Self Care | Payer: Medicare HMO | Attending: Ophthalmology | Admitting: Ophthalmology

## 2019-03-24 DIAGNOSIS — I69398 Other sequelae of cerebral infarction: Secondary | ICD-10-CM | POA: Insufficient documentation

## 2019-03-24 DIAGNOSIS — E1122 Type 2 diabetes mellitus with diabetic chronic kidney disease: Secondary | ICD-10-CM | POA: Diagnosis not present

## 2019-03-24 DIAGNOSIS — F419 Anxiety disorder, unspecified: Secondary | ICD-10-CM | POA: Insufficient documentation

## 2019-03-24 DIAGNOSIS — H2512 Age-related nuclear cataract, left eye: Secondary | ICD-10-CM | POA: Diagnosis not present

## 2019-03-24 DIAGNOSIS — I130 Hypertensive heart and chronic kidney disease with heart failure and stage 1 through stage 4 chronic kidney disease, or unspecified chronic kidney disease: Secondary | ICD-10-CM | POA: Insufficient documentation

## 2019-03-24 DIAGNOSIS — Z853 Personal history of malignant neoplasm of breast: Secondary | ICD-10-CM | POA: Insufficient documentation

## 2019-03-24 DIAGNOSIS — I340 Nonrheumatic mitral (valve) insufficiency: Secondary | ICD-10-CM | POA: Diagnosis not present

## 2019-03-24 DIAGNOSIS — Z87891 Personal history of nicotine dependence: Secondary | ICD-10-CM | POA: Diagnosis not present

## 2019-03-24 DIAGNOSIS — E1136 Type 2 diabetes mellitus with diabetic cataract: Secondary | ICD-10-CM | POA: Insufficient documentation

## 2019-03-24 DIAGNOSIS — Z90710 Acquired absence of both cervix and uterus: Secondary | ICD-10-CM | POA: Diagnosis not present

## 2019-03-24 DIAGNOSIS — Z8711 Personal history of peptic ulcer disease: Secondary | ICD-10-CM | POA: Diagnosis not present

## 2019-03-24 DIAGNOSIS — I5022 Chronic systolic (congestive) heart failure: Secondary | ICD-10-CM | POA: Diagnosis not present

## 2019-03-24 DIAGNOSIS — N189 Chronic kidney disease, unspecified: Secondary | ICD-10-CM | POA: Insufficient documentation

## 2019-03-24 DIAGNOSIS — Z9221 Personal history of antineoplastic chemotherapy: Secondary | ICD-10-CM | POA: Diagnosis not present

## 2019-03-24 DIAGNOSIS — K219 Gastro-esophageal reflux disease without esophagitis: Secondary | ICD-10-CM | POA: Insufficient documentation

## 2019-03-24 DIAGNOSIS — E7800 Pure hypercholesterolemia, unspecified: Secondary | ICD-10-CM | POA: Insufficient documentation

## 2019-03-24 DIAGNOSIS — I509 Heart failure, unspecified: Secondary | ICD-10-CM | POA: Insufficient documentation

## 2019-03-24 DIAGNOSIS — Z85118 Personal history of other malignant neoplasm of bronchus and lung: Secondary | ICD-10-CM | POA: Insufficient documentation

## 2019-03-24 DIAGNOSIS — Z9049 Acquired absence of other specified parts of digestive tract: Secondary | ICD-10-CM | POA: Insufficient documentation

## 2019-03-24 DIAGNOSIS — L659 Nonscarring hair loss, unspecified: Secondary | ICD-10-CM | POA: Diagnosis not present

## 2019-03-24 DIAGNOSIS — J45909 Unspecified asthma, uncomplicated: Secondary | ICD-10-CM | POA: Diagnosis not present

## 2019-03-24 HISTORY — DX: Nonrheumatic mitral (valve) insufficiency: I34.0

## 2019-03-24 HISTORY — DX: Gastro-esophageal reflux disease without esophagitis: K21.9

## 2019-03-24 HISTORY — PX: CATARACT EXTRACTION W/PHACO: SHX586

## 2019-03-24 HISTORY — DX: Myoneural disorder, unspecified: G70.9

## 2019-03-24 LAB — GLUCOSE, CAPILLARY
Glucose-Capillary: 233 mg/dL — ABNORMAL HIGH (ref 70–99)
Glucose-Capillary: 221 mg/dL — ABNORMAL HIGH (ref 70–99)
Glucose-Capillary: 217 mg/dL — ABNORMAL HIGH (ref 70–99)

## 2019-03-24 SURGERY — PHACOEMULSIFICATION, CATARACT, WITH IOL INSERTION
Anesthesia: Monitor Anesthesia Care | Site: Eye | Laterality: Left

## 2019-03-24 MED ORDER — NA HYALUR & NA CHOND-NA HYALUR 0.55-0.5 ML IO KIT
PACK | INTRAOCULAR | Status: AC
  Filled 2019-03-24: qty 1.05

## 2019-03-24 MED ORDER — LIDOCAINE HCL (PF) 4 % IJ SOLN
INTRAOCULAR | Status: DC | PRN
  Administered 2019-03-24: 2.25 mL via OPHTHALMIC

## 2019-03-24 MED ORDER — MIDAZOLAM HCL 2 MG/2ML IJ SOLN
INTRAMUSCULAR | Status: DC | PRN
  Administered 2019-03-24: 1.5 mg via INTRAVENOUS
  Administered 2019-03-24: 0.5 mg via INTRAVENOUS

## 2019-03-24 MED ORDER — LIDOCAINE HCL (PF) 4 % IJ SOLN
INTRAMUSCULAR | Status: AC
  Filled 2019-03-24: qty 5

## 2019-03-24 MED ORDER — TRYPAN BLUE 0.06 % OP SOLN
OPHTHALMIC | Status: AC
  Filled 2019-03-24: qty 0.5

## 2019-03-24 MED ORDER — EPINEPHRINE PF 1 MG/ML IJ SOLN
INTRAOCULAR | Status: DC | PRN
  Administered 2019-03-24: 1 mL via OPHTHALMIC

## 2019-03-24 MED ORDER — MOXIFLOXACIN HCL 0.5 % OP SOLN
OPHTHALMIC | Status: DC | PRN
  Administered 2019-03-24: .2 mL via OPHTHALMIC

## 2019-03-24 MED ORDER — SODIUM CHLORIDE 0.9 % IV SOLN
INTRAVENOUS | Status: DC
  Administered 2019-03-24: 08:00:00 via INTRAVENOUS

## 2019-03-24 MED ORDER — INSULIN ASPART 100 UNIT/ML ~~LOC~~ SOLN
SUBCUTANEOUS | Status: AC
  Administered 2019-03-24: 08:00:00 4 [IU] via SUBCUTANEOUS
  Filled 2019-03-24: qty 1

## 2019-03-24 MED ORDER — POVIDONE-IODINE 5 % OP SOLN
OPHTHALMIC | Status: DC | PRN
  Administered 2019-03-24: 1 via OPHTHALMIC

## 2019-03-24 MED ORDER — ARMC OPHTHALMIC DILATING DROPS
1.0000 "application " | OPHTHALMIC | Status: AC
  Administered 2019-03-24 (×3): 1 via OPHTHALMIC

## 2019-03-24 MED ORDER — ONDANSETRON HCL 4 MG/2ML IJ SOLN
4.0000 mg | Freq: Once | INTRAMUSCULAR | Status: AC | PRN
  Administered 2019-03-24: 4 mg via INTRAVENOUS

## 2019-03-24 MED ORDER — TRYPAN BLUE 0.06 % OP SOLN
OPHTHALMIC | Status: DC | PRN
  Administered 2019-03-24: .5 mL via INTRAOCULAR

## 2019-03-24 MED ORDER — TETRACAINE HCL 0.5 % OP SOLN
OPHTHALMIC | Status: AC
  Administered 2019-03-24: 08:00:00 1 [drp] via OPHTHALMIC
  Filled 2019-03-24: qty 4

## 2019-03-24 MED ORDER — INSULIN ASPART 100 UNIT/ML ~~LOC~~ SOLN
4.0000 [IU] | Freq: Once | SUBCUTANEOUS | Status: AC
  Administered 2019-03-24: 08:00:00 4 [IU] via SUBCUTANEOUS

## 2019-03-24 MED ORDER — NA CHONDROIT SULF-NA HYALURON 40-17 MG/ML IO SOLN
INTRAOCULAR | Status: AC
  Filled 2019-03-24: qty 1

## 2019-03-24 MED ORDER — MIDAZOLAM HCL 2 MG/2ML IJ SOLN
INTRAMUSCULAR | Status: AC
  Filled 2019-03-24: qty 2

## 2019-03-24 MED ORDER — TETRACAINE HCL 0.5 % OP SOLN
1.0000 [drp] | Freq: Once | OPHTHALMIC | Status: AC
  Administered 2019-03-24 (×2): 1 [drp] via OPHTHALMIC

## 2019-03-24 MED ORDER — FENTANYL CITRATE (PF) 100 MCG/2ML IJ SOLN
INTRAMUSCULAR | Status: AC
  Filled 2019-03-24: qty 2

## 2019-03-24 MED ORDER — POVIDONE-IODINE 5 % OP SOLN
OPHTHALMIC | Status: AC
  Filled 2019-03-24: qty 30

## 2019-03-24 MED ORDER — EPINEPHRINE PF 1 MG/ML IJ SOLN
INTRAMUSCULAR | Status: AC
  Filled 2019-03-24: qty 1

## 2019-03-24 MED ORDER — CARBACHOL 0.01 % IO SOLN
INTRAOCULAR | Status: DC | PRN
  Administered 2019-03-24: .5 mL via INTRAOCULAR

## 2019-03-24 MED ORDER — NA CHONDROIT SULF-NA HYALURON 40-17 MG/ML IO SOLN
INTRAOCULAR | Status: DC | PRN
  Administered 2019-03-24: 1 mL via INTRAOCULAR

## 2019-03-24 MED ORDER — DORZOLAMIDE HCL-TIMOLOL MAL 2-0.5 % OP SOLN
OPHTHALMIC | Status: AC
  Filled 2019-03-24: qty 10

## 2019-03-24 MED ORDER — FENTANYL CITRATE (PF) 100 MCG/2ML IJ SOLN
INTRAMUSCULAR | Status: DC | PRN
  Administered 2019-03-24: 75 ug via INTRAVENOUS
  Administered 2019-03-24: 25 ug via INTRAVENOUS

## 2019-03-24 MED ORDER — ARMC OPHTHALMIC DILATING DROPS
OPHTHALMIC | Status: AC
  Administered 2019-03-24: 1 via OPHTHALMIC
  Filled 2019-03-24: qty 0.5

## 2019-03-24 MED ORDER — MOXIFLOXACIN HCL 0.5 % OP SOLN
OPHTHALMIC | Status: AC
  Filled 2019-03-24: qty 3

## 2019-03-24 MED ORDER — MOXIFLOXACIN HCL 0.5 % OP SOLN: 1.0000 [drp] | Freq: Once | OPHTHALMIC | Status: DC

## 2019-03-24 SURGICAL SUPPLY — 18 items
DISSECTOR HYDRO NUCLEUS 50X22 (MISCELLANEOUS) ×12 IMPLANT
DRSG TEGADERM 2-3/8X2-3/4 SM (GAUZE/BANDAGES/DRESSINGS) ×3 IMPLANT
GLOVE BIOGEL M 6.5 STRL (GLOVE) ×3 IMPLANT
GOWN STRL REUS W/ TWL LRG LVL3 (GOWN DISPOSABLE) ×1 IMPLANT
GOWN STRL REUS W/ TWL XL LVL3 (GOWN DISPOSABLE) ×1 IMPLANT
GOWN STRL REUS W/TWL LRG LVL3 (GOWN DISPOSABLE) ×2
GOWN STRL REUS W/TWL XL LVL3 (GOWN DISPOSABLE) ×2
KNIFE 45D UP 2.3 (MISCELLANEOUS) ×3 IMPLANT
LABEL CATARACT MEDS ST (LABEL) ×3 IMPLANT
LENS IOL ACRSF IQ ULTRA 17.0 (Intraocular Lens) IMPLANT
LENS IOL ACRYSOF IQ 17.0 (Intraocular Lens) ×3 IMPLANT
PACK CATARACT (MISCELLANEOUS) ×3 IMPLANT
PACK CATARACT KING (MISCELLANEOUS) ×3 IMPLANT
PACK EYE AFTER SURG (MISCELLANEOUS) ×3 IMPLANT
SOL BSS BAG (MISCELLANEOUS) ×3
SOLUTION BSS BAG (MISCELLANEOUS) ×1 IMPLANT
WATER STERILE IRR 250ML POUR (IV SOLUTION) ×3 IMPLANT
WIPE NON LINTING 3.25X3.25 (MISCELLANEOUS) ×3 IMPLANT

## 2019-03-24 NOTE — Anesthesia Post-op Follow-up Note (Signed)
Anesthesia QCDR form completed.        

## 2019-03-24 NOTE — Anesthesia Preprocedure Evaluation (Signed)
Anesthesia Evaluation  Patient identified by MRN, date of birth, ID band Patient awake    Reviewed: Allergy & Precautions, H&P , NPO status , reviewed documented beta blocker date and time   Airway Mallampati: III  TM Distance: >3 FB Neck ROM: full    Dental  (+) Teeth Intact, Caps   Pulmonary asthma , former smoker,    Pulmonary exam normal        Cardiovascular hypertension, +CHF  Normal cardiovascular exam     Neuro/Psych PSYCHIATRIC DISORDERS Anxiety Depression  Neuromuscular disease CVA    GI/Hepatic GERD  Medicated and Controlled,  Endo/Other  diabetesMorbid obesity  Renal/GU Renal disease     Musculoskeletal   Abdominal   Peds  Hematology  (+) Blood dyscrasia, anemia ,   Anesthesia Other Findings Past Medical History: No date: Anemia No date: Anxiety No date: Asthma 03/10/2018: Cancer ([place])     Comment:  Per NM PET order. Carcinoma of upper-inner quadrant of               left breast in female, estrogen receptor positive . No date: Cancer [facility])     Comment:  LUNG 1997: CHF (congestive heart failure) ([place]) No date: CKD (chronic kidney disease) No date: Depression No date: Diabetes mellitus, type 2 (HCC) No date: Family history of breast cancer No date: Family history of colon cancer No date: Family history of ovarian cancer No date: Family history of pancreatic cancer No date: Family history of prostate cancer No date: Family history of stomach cancer No date: GERD (gastroesophageal reflux disease)     Comment:  history of an ulcer No date: Hair loss 05/29/14: History of left breast cancer 12/31/2016: History of partial hysterectomy     Comment:  Per patient.  Has not had a period in years.  Had a               partial hysterectomy years ago. No date: Hypertension No date: Mitral valve regurgitation No date: Neuromuscular disorder ([place])     Comment:  neuropathies in hand No date:  Obesity 1997: Pancreatitis 2010: Stroke [facility])     Comment:  with mild left arm weakness  Past Surgical History: 02/24/2019: CATARACT EXTRACTION W/PHACO; Right     Comment:  Procedure: CATARACT EXTRACTION PHACO AND INTRAOCULAR               LENS PLACEMENT ([place]) RIGHT DIABETES;  Surgeon: Marchia Meiers, MD;  Location: [facility] ORS;  Service: Ophthalmology;               Laterality: Right;  [place] 01:13.0 CDE 7.96 Fluid Pack Lot               # [id number] No date: CESAREAN SECTION No date: CHOLECYSTECTOMY 08/17/2018: EXCISION OF TONGUE LESION; N/A     Comment:  Procedure: EXCISION OF TONGUE LESION WITH FROZEN               SECTIONS;  Surgeon: Beverly Gust, MD;  Location: [facility]              ORS;  Service: ENT;  Laterality: N/A; No date: EYE SURGERY; Right     Comment:  cataract extraction 12/31/2016: PARTIAL HYSTERECTOMY     Comment:  Per patient, she has not had a period in years since she              had a partial hysterectomy.  No date: PORTA CATH INSERTION No date: TUBAL LIGATION  BMI    Body Mass Index: 39.05 kg/m      Reproductive/Obstetrics                             Anesthesia Physical Anesthesia Plan  ASA: III  Anesthesia Plan: MAC   Post-op Pain Management:    Induction: Intravenous  PONV Risk Score and Plan: 2 and TIVA and Midazolam  Airway Management Planned: Nasal Cannula and Natural Airway  Additional Equipment:   Intra-op Plan:   Post-operative Plan:   Informed Consent: I have reviewed the patients History and Physical, chart, labs and discussed the procedure including the risks, benefits and alternatives for the proposed anesthesia with the patient or authorized representative who has indicated his/her understanding and acceptance.     Dental Advisory Given  Plan Discussed with: CRNA  Anesthesia Plan Comments:         Anesthesia Quick Evaluation

## 2019-03-24 NOTE — H&P (Signed)
   I have reviewed the patient's H&P and agree with its findings. There have been no interval changes.  Rini Moffit MD Ophthalmology 

## 2019-03-24 NOTE — Anesthesia Postprocedure Evaluation (Signed)
Anesthesia Post Note  Patient: Delayne A Evon  Procedure(s) Performed: CATARACT EXTRACTION PHACO AND INTRAOCULAR LENS PLACEMENT ([facility]) - left diabetic (Left Eye)  Patient location during evaluation: Phase II Anesthesia Type: MAC Level of consciousness: awake and alert Pain management: pain level controlled Vital Signs Assessment: post-procedure vital signs reviewed and stable Respiratory status: spontaneous breathing, nonlabored ventilation and respiratory function stable Cardiovascular status: blood pressure returned to baseline and stable Postop Assessment: no apparent nausea or vomiting Anesthetic complications: no     Last Vitals:  Vitals:   03/24/19 0805 03/24/19 1002  BP: (!) 155/92 125/74  Pulse: 69 64  Resp: 18 18  Temp: 36.6 C 36.5 C  SpO2: 100% 99%    Last Pain:  Vitals:   03/24/19 1002  TempSrc: Oral  PainSc: 0-No pain                 Alphonsus Sias

## 2019-03-24 NOTE — Discharge Instructions (Addendum)
Eye Surgery Discharge Instructions  Expect mild scratchy sensation or mild soreness. DO NOT RUB YOUR EYE!  The day of surgery:  Minimal physical activity, but bed rest is not required  No reading, computer work, or close hand work  No bending, lifting, or straining.  May watch TV  For 24 hours:  No driving, legal decisions, or alcoholic beverages  Safety precautions  Eat anything you prefer: It is better to start with liquids, then soup then solid foods.    Resume all regular medications including aspirin or Coumadin if these were discontinued prior to surgery. You may shower, bathe, shave, or wash your hair. Tylenol may be taken for mild discomfort. DO NOT REMOVE EYE PATCH  Call your doctor if you experience significant pain, nausea, or vomiting, fever > 101 or other signs of infection. [phone] or [phone] Specific instructions:  Follow-up Information    Marchia Meiers, MD Follow up.   Specialty: Ophthalmology Why: 03-25-19 @ 8:15 am  Contact information: [address] [place] [address] [phone]

## 2019-03-24 NOTE — Op Note (Signed)
  PREOPERATIVE DIAGNOSIS:  Nuclear sclerotic cataract of the LEFT eye.   POSTOPERATIVE DIAGNOSIS:  Nuclear sclerotic cataract of the LEFT eye.   OPERATIVE PROCEDURE: Cataract surgery OS   SURGEON:  Marchia Meiers, MD.   ANESTHESIA:  Anesthesiologist: Alphonsus Sias, MD CRNA: Willette Alma, CRNA; Jonna Clark, CRNA  1.      Managed anesthesia care. 2.     0.58ml of Shugarcaine was instilled following the paracentesis   COMPLICATIONS:  Anterior capsular tear, without posterior extension   TECHNIQUE:   Divide and conquer   DESCRIPTION OF PROCEDURE:  The patient was examined and consented in the preoperative holding area where the aforementioned topical anesthesia was applied to the LEFT eye and then brought back to the Operating Room where the left eye was prepped and draped in the usual sterile ophthalmic fashion and a lid speculum was placed. A paracentesis was created with the side port blade, the anterior chamber was washed out with trypan blue to stain the anterior capsule, and the anterior chamber was filled with viscoelastic. A near clear corneal incision was performed with the steel keratome. A continuous curvilinear capsulorrhexis was performed with a cystotome followed by the capsulorrhexis forceps. Hydrodissection and hydrodelineation were carried out with BSS on a blunt cannula. The lens was removed in a divide and conquer  technique and the remaining cortical material was removed with the irrigation-aspiration handpiece. The capsular bag was inflated with viscoelastic and the lens was placed in the capsular bag without complication. The remaining viscoelastic was removed from the eye with the irrigation-aspiration handpiece. The wounds were hydrated. The anterior chamber was flushed and the eye was inflated to physiologic pressure. 0.22ml Vigamox was placed in the anterior chamber. The wounds were found to be water tight. The eye was dressed with Vigamox. The patient was given  protective glasses to wear throughout the day and a shield with which to sleep tonight. The patient was also given drops with which to begin a drop regimen today and will follow-up with me in one day. Implant Name Type Inv. Item Serial No. Manufacturer Lot No. LRB No. Used Action  LENS IOL ACRYSOF IQ 17.0 - [id number] Intraocular Lens LENS IOL ACRYSOF IQ 17.0 [id number] ALCON  Left 1 Implanted    Procedure(s) with comments: CATARACT EXTRACTION PHACO AND INTRAOCULAR LENS PLACEMENT ([facility]) - left diabetic (Left) - [place]  01:36 CDE 13.93 Fluid pack lot # [id number]  Electronically signed: Marchia Meiers 03/24/2019 12:03 PM

## 2019-03-24 NOTE — Transfer of Care (Signed)
Immediate Anesthesia Transfer of Care Note  Patient: [name]  Procedure(s) Performed: CATARACT EXTRACTION PHACO AND INTRAOCULAR LENS PLACEMENT (IOC) - left diabetic (Left Eye)  Patient Location: PACU  Anesthesia Type:MAC  Level of Consciousness: awake, alert  and oriented  Airway & Oxygen Therapy: Patient Spontanous Breathing  Post-op Assessment: Report given to RN and Post -op Vital signs reviewed and stable  Post vital signs: Reviewed and stable  Last Vitals:  Vitals Value Taken Time  BP 125/74 03/24/19 1002  Temp 36.5 C 03/24/19 1002  Pulse 64 03/24/19 1002  Resp 18 03/24/19 1002  SpO2 99 % 03/24/19 1002    Last Pain:  Vitals:   03/24/19 1002  TempSrc: Oral  PainSc: 0-No pain         Complications: No apparent anesthesia complications

## 2019-03-31 ENCOUNTER — Other Ambulatory Visit: Payer: Self-pay

## 2019-04-01 ENCOUNTER — Encounter: Payer: Self-pay | Admitting: Internal Medicine

## 2019-04-01 ENCOUNTER — Other Ambulatory Visit: Payer: Self-pay

## 2019-04-01 ENCOUNTER — Inpatient Hospital Stay: Payer: Medicare HMO

## 2019-04-01 ENCOUNTER — Inpatient Hospital Stay (HOSPITAL_BASED_OUTPATIENT_CLINIC_OR_DEPARTMENT_OTHER): Payer: Medicare HMO | Admitting: Internal Medicine

## 2019-04-01 DIAGNOSIS — I509 Heart failure, unspecified: Secondary | ICD-10-CM | POA: Diagnosis not present

## 2019-04-01 DIAGNOSIS — E1122 Type 2 diabetes mellitus with diabetic chronic kidney disease: Secondary | ICD-10-CM | POA: Diagnosis not present

## 2019-04-01 DIAGNOSIS — C50212 Malignant neoplasm of upper-inner quadrant of left female breast: Secondary | ICD-10-CM

## 2019-04-01 DIAGNOSIS — Z170 Estrogen receptor positive status [ER+]: Secondary | ICD-10-CM

## 2019-04-01 DIAGNOSIS — R5381 Other malaise: Secondary | ICD-10-CM | POA: Diagnosis not present

## 2019-04-01 DIAGNOSIS — Z7189 Other specified counseling: Secondary | ICD-10-CM

## 2019-04-01 DIAGNOSIS — R5383 Other fatigue: Secondary | ICD-10-CM | POA: Diagnosis not present

## 2019-04-01 DIAGNOSIS — Z95828 Presence of other vascular implants and grafts: Secondary | ICD-10-CM

## 2019-04-01 DIAGNOSIS — D649 Anemia, unspecified: Secondary | ICD-10-CM | POA: Diagnosis not present

## 2019-04-01 DIAGNOSIS — I130 Hypertensive heart and chronic kidney disease with heart failure and stage 1 through stage 4 chronic kidney disease, or unspecified chronic kidney disease: Secondary | ICD-10-CM | POA: Diagnosis not present

## 2019-04-01 DIAGNOSIS — R600 Localized edema: Secondary | ICD-10-CM | POA: Diagnosis not present

## 2019-04-01 LAB — COMPREHENSIVE METABOLIC PANEL
ALT: 18 U/L (ref 0–44)
AST: 18 U/L (ref 15–41)
Albumin: 3.7 g/dL (ref 3.5–5.0)
Alkaline Phosphatase: 71 U/L (ref 38–126)
Anion gap: 8 (ref 5–15)
BUN: 24 mg/dL — ABNORMAL HIGH (ref 8–23)
CO2: 26 mmol/L (ref 22–32)
Calcium: 8.2 mg/dL — ABNORMAL LOW (ref 8.9–10.3)
Chloride: 104 mmol/L (ref 98–111)
Creatinine, Ser: 2.97 mg/dL — ABNORMAL HIGH (ref 0.44–1.00)
GFR calc Af Amer: 19 mL/min — ABNORMAL LOW (ref >60)
GFR calc non Af Amer: 16 mL/min — ABNORMAL LOW (ref >60)
Glucose, Bld: 87 mg/dL (ref 70–99)
Potassium: 4 mmol/L (ref 3.5–5.1)
Sodium: 138 mmol/L (ref 135–145)
Total Bilirubin: 0.5 mg/dL (ref 0.3–1.2)
Total Protein: 7.2 g/dL (ref 6.5–8.1)

## 2019-04-01 LAB — CBC WITH DIFFERENTIAL/PLATELET
Abs Immature Granulocytes: 0.15 10*3/uL — ABNORMAL HIGH (ref 0.00–0.07)
Basophils Absolute: 0.1 10*3/uL (ref 0.0–0.1)
Basophils Relative: 1 %
Eosinophils Absolute: 0.1 10*3/uL (ref 0.0–0.5)
Eosinophils Relative: 1 %
HCT: 30.1 % — ABNORMAL LOW (ref 36.0–46.0)
Hemoglobin: 10.4 g/dL — ABNORMAL LOW (ref 12.0–15.0)
Immature Granulocytes: 2 %
Lymphocytes Relative: 15 %
Lymphs Abs: 1.4 10*3/uL (ref 0.7–4.0)
MCH: 31.4 pg (ref 26.0–34.0)
MCHC: 34.6 g/dL (ref 30.0–36.0)
MCV: 90.9 fL (ref 80.0–100.0)
Monocytes Absolute: 1 10*3/uL (ref 0.1–1.0)
Monocytes Relative: 11 %
Neutro Abs: 6.7 10*3/uL (ref 1.7–7.7)
Neutrophils Relative %: 70 %
Platelets: 339 10*3/uL (ref 150–400)
RBC: 3.31 MIL/uL — ABNORMAL LOW (ref 3.87–5.11)
RDW: 12.4 % (ref 11.5–15.5)
WBC: 9.3 10*3/uL (ref 4.0–10.5)
nRBC: 0 % (ref 0.0–0.2)

## 2019-04-01 MED ORDER — SODIUM CHLORIDE 0.9 % IV SOLN
Freq: Once | INTRAVENOUS | Status: AC
  Administered 2019-04-01: 10:00:00 via INTRAVENOUS
  Filled 2019-04-01: qty 250

## 2019-04-01 MED ORDER — SODIUM CHLORIDE 0.9% FLUSH
10.0000 mL | Freq: Once | INTRAVENOUS | Status: AC
  Administered 2019-04-01: 10 mL via INTRAVENOUS
  Filled 2019-04-01: qty 10

## 2019-04-01 MED ORDER — DIPHENHYDRAMINE HCL 50 MG/ML IJ SOLN
50.0000 mg | Freq: Once | INTRAMUSCULAR | Status: AC
  Administered 2019-04-01: 50 mg via INTRAVENOUS
  Filled 2019-04-01: qty 1

## 2019-04-01 MED ORDER — DEXAMETHASONE SODIUM PHOSPHATE 10 MG/ML IJ SOLN
4.0000 mg | Freq: Once | INTRAMUSCULAR | Status: AC
  Administered 2019-04-01: 4 mg via INTRAVENOUS
  Filled 2019-04-01: qty 1

## 2019-04-01 MED ORDER — SODIUM CHLORIDE 0.9 % IV SOLN
60.0000 mg/m2 | Freq: Once | INTRAVENOUS | Status: AC
  Administered 2019-04-01: 114 mg via INTRAVENOUS
  Filled 2019-04-01: qty 19

## 2019-04-01 MED ORDER — HEPARIN SOD (PORK) LOCK FLUSH 100 UNIT/ML IV SOLN
500.0000 [IU] | Freq: Once | INTRAVENOUS | Status: AC | PRN
  Administered 2019-04-01: 500 [IU]
  Filled 2019-04-01: qty 5

## 2019-04-01 MED ORDER — FAMOTIDINE IN NACL 20-0.9 MG/50ML-% IV SOLN
20.0000 mg | Freq: Once | INTRAVENOUS | Status: AC
  Administered 2019-04-01: 20 mg via INTRAVENOUS
  Filled 2019-04-01: qty 50

## 2019-04-01 NOTE — Assessment & Plan Note (Addendum)
Left breast cancer- stage IV- ER/PR +, HER-2/neu - on Taxol chemotherapy; PET May 26th 2020- stable bone lesions/left breast mass; decreasing effusions/left lung atelectasis; right pelvic LN- improved.  Clinically stable. TM-80s.    # Proceed with Taxol every 2 weeks. Labs today reviewed;  acceptable for treatment today. Ordered PET scan today. 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   # Bil LE swelling/ededma MILD- discussed re: compression stocking/ leg- stable  # Chronic kidney disease - stage IV-creat-2.9; STABLE. Dr.Kolluru  # Anemia- hemoglobin today-9-10- STABLE  #  DISPOSITION:  # Treatment today # follow up in 2 weeks-] labs/MD-cbc/cmp-ca-27-29; Taxol;PET prior-  Dr.B

## 2019-04-01 NOTE — Progress Notes (Signed)
[name] OFFICE PROGRESS NOTE  Patient Care Team: [name], [name], MD as Medical Oncologist (Medical Oncology) Corey Skains, MD as Consulting Physician (Cardiology)  Cancer Staging No matching staging information was found for the patient.   Oncology History Overview Note  # OCT 2015-STAGE IV LEFT BREAST T2N1 [T=4cm; [name]; [name]; [name]; EBUS- Positive Paratrac/subcarinal LN s/p ? Taxotere [in [place]; [name]; [name];[name]; [name]; suv 3]; [name]; [name]; [name];  [name]; Ant R iliac bone; [name]; [name]; START-Taxol weekly. March 2020- Taxol every 2 weeks [PN]  # Poorly controlled Blood sugars- improved.   # Pancreatitis Hx/PEI- on creon in past / CKD IV [creat ~ 3-4; [name]; [name]; mild left sided weakness]  # Jan 2020-  Lobular lesion on tongue- s/p excision pyogenic granuloma [Dr.McQueen]   # GENETIC TESTING/COUNSELLING: HETEROZYGOUS Cystic Fibrosis Gene [explains hx of recurrent pancreatitis]  # MOLECULAR TESTING: NA  ------------------------------------------------   DIAGNOSIS: [ 2015] BREAST CA; [name]; [name] ;GOALS: Palliative  CURRENT/MOST RECENT THERAPY [ April 2019] TAXOL    Carcinoma of upper-inner quadrant of left breast in female, estrogen receptor positive ([name])      INTERVAL HISTORY:  [name] 63 y.o.  female pleasant patient above  history of ER PR positive HER-2 negative breast cancer on Taxol is here for follow-up.  Patient underwent cataract surgery on the left eye on 20 August last week.  Postoperative recovery uneventful.  No nausea no vomiting.  Chronic swelling in the legs not any worse.  Chronic mild tingling and numbness in extremities.  Stable on Neurontin.  No headaches.   Review of Systems  Constitutional: Positive for malaise/fatigue. Negative for chills, diaphoresis, [name], hemoptysis, sputum production, shortness of breath and wheezing.   Cardiovascular: Positive for leg swelling. Negative for chest pain, palpitations and orthopnea.  Gastrointestinal: Negative for abdominal pain, blood in stool, constipation, diarrhea, heartburn, melena, nausea and vomiting.  Genitourinary: Negative for dysuria, frequency and urgency.  Musculoskeletal: Negative for back pain and joint pain.  Skin: Negative.  Negative for itching and rash.  Neurological: Positive for tingling. Negative for dizziness, focal weakness, weakness and headaches.  Endo/Heme/Allergies: Does not bruise/bleed easily.  Psychiatric/Behavioral: Negative for depression. The patient is not nervous/anxious and does not have insomnia.       PAST MEDICAL HISTORY :  Past Medical History:  Diagnosis Date  . Anemia   . Anxiety   . Asthma   . Cancer ([place]) 03/10/2018   Per NM PET order. Carcinoma of upper-inner quadrant of left breast in female, estrogen receptor positive .  Marland Kitchen Cancer (HCC)    LUNG  . CHF (congestive heart failure) ([place]) 1997  . CKD (chronic kidney disease)   . Depression   . Diabetes mellitus, type 2 ([place])   . Family history of breast cancer   .  Family history of colon cancer   . Family history of ovarian cancer   . Family history of pancreatic cancer   . Family history of prostate cancer   .  Family history of stomach cancer   . GERD (gastroesophageal reflux disease)    history of an ulcer  . Hair loss   . History of left breast cancer 05/29/14  . History of partial hysterectomy 12/31/2016   Per patient.  Has not had a period in years.  Had a partial hysterectomy years ago.  Marland Kitchen Hypertension   . Mitral valve regurgitation   . Neuromuscular disorder (HCC)    neuropathies in hand  . Obesity   . Pancreatitis 1997  . Stroke [facility] 2010   with mild left arm weakness    PAST SURGICAL HISTORY :   Past Surgical History:  Procedure Laterality Date  . CATARACT EXTRACTION W/PHACO Right 02/24/2019   Procedure: CATARACT EXTRACTION PHACO AND INTRAOCULAR LENS PLACEMENT ([place]) RIGHT DIABETES;  Surgeon: Marchia Meiers, MD;  Location: [facility] ORS;  Service: Ophthalmology;  Laterality: Right;  [place] 01:13.0 CDE 7.96 Fluid Pack Lot # [id number]  . CATARACT EXTRACTION W/PHACO Left 03/24/2019   Procedure: CATARACT EXTRACTION PHACO AND INTRAOCULAR LENS PLACEMENT (IOC) - left diabetic;  Surgeon: Marchia Meiers, MD;  Location: [facility] ORS;  Service: Ophthalmology;  Laterality: Left;  [place]  01:36 CDE 13.93 Fluid pack lot # [id number]  . CESAREAN SECTION    . CHOLECYSTECTOMY    . EXCISION OF TONGUE LESION N/A 08/17/2018   Procedure: EXCISION OF TONGUE LESION WITH FROZEN SECTIONS;  Surgeon: Beverly Gust, MD;  Location: [facility] ORS;  Service: ENT;  Laterality: N/A;  . EYE SURGERY Right    cataract extraction  . PARTIAL HYSTERECTOMY  12/31/2016   Per patient, she has not had a period in years since she had a partial hysterectomy.  Marland Kitchen PORTA CATH INSERTION    . TUBAL LIGATION      FAMILY HISTORY :   Family History  Problem Relation Age of Onset  . Ovarian cancer Mother 60  . Diabetes Mother   . Hypertension Mother   . COPD Father   . Hypertension Father   . Colon cancer Father 35  . Diabetes Sister   . Breast cancer Sister 31       bilateral  . Diabetes Brother   . Leukemia Maternal Aunt   . Pancreatic  cancer Paternal Aunt 27  . Pancreatic cancer Paternal Uncle   . Colon cancer Paternal Uncle   . Stomach cancer Maternal Grandfather 79  . Throat cancer Paternal Grandmother   . Breast cancer Maternal Aunt 80  . Colon cancer Maternal Aunt   . Bone cancer Maternal Aunt   . Breast cancer Paternal Aunt        dx >50  . Prostate cancer Paternal Uncle   . Pancreatic cancer Paternal Uncle   . Throat cancer Paternal Uncle   . Lung cancer Paternal Uncle   . Stomach cancer Paternal Uncle   . Brain cancer Paternal Aunt   . Cancer Cousin        liver, kidney  . Prostate cancer Cousin        meastatic  . Lung cancer Other     SOCIAL HISTORY:   Social History   Tobacco Use  . Smoking status: Former Smoker    Packs/day: 0.50    Years: 1.00    Pack years: 0.50    Types: Cigarettes  . Smokeless tobacco: Never Used  Substance Use Topics  . Alcohol use: No    Alcohol/week: 0.0 standard drinks  . Drug use: No    ALLERGIES:  has No Known Allergies.  MEDICATIONS:  Current Outpatient Medications  Medication Sig Dispense Refill  . acetaminophen-codeine (TYLENOL #4) 300-60 MG tablet Take 1-2 tablets by mouth every 4 (four) hours as needed for moderate pain.     Marland Kitchen albuterol (PROAIR HFA) 108 (90 BASE) MCG/ACT inhaler Inhale 2 puffs into the lungs every 6 (six) hours as needed for wheezing or shortness of breath.     Marland Kitchen albuterol (PROVENTIL) (2.5 MG/3ML) 0.083% nebulizer solution Inhale 2.5 mg into the lungs every 6 (six) hours as needed for shortness of breath.     . ALPRAZolam (XANAX) 0.5 MG tablet Take 0.5 mg by mouth at bedtime as needed for anxiety or sleep.     Marland Kitchen amLODipine (NORVASC) 10 MG tablet Take 10 mg by mouth daily.     Marland Kitchen aspirin EC 81 MG tablet Take 81 mg by mouth daily.     Marland Kitchen atenolol (TENORMIN) 100 MG tablet Take 100 mg by mouth 2 (two) times daily.     . B-D ULTRA-FINE 33 LANCETS MISC Use 1 each 2 (two) times daily.    . B-D ULTRAFINE III SHORT PEN 31G X 8 MM MISC     .  bumetanide (BUMEX) 0.5 MG tablet Take 0.5 mg by mouth 2 (two) times daily.     . calcitRIOL (ROCALTROL) 0.25 MCG capsule Take 0.25 mcg by mouth every Monday, Wednesday, and Friday.     . Cinnamon 500 MG capsule Take 1,000 mg by mouth daily.     . cloNIDine (CATAPRES) 0.2 MG tablet Take 0.2 mg by mouth 2 (two) times daily.     . enalapril (VASOTEC) 10 MG tablet Take 20 mg by mouth 2 (two) times a day.     . famotidine (PEPCID) 20 MG tablet Take 20 mg by mouth 2 (two) times daily.    . ferrous sulfate 325 (65 FE) MG tablet Take 325 mg by mouth 2 (two) times daily with a meal.    . FLUoxetine (PROZAC) 20 MG capsule Take 20 mg by mouth 2 (two) times daily.     . Fluticasone Propionate, Inhal, (FLOVENT DISKUS) 100 MCG/BLIST AEPB Inhale 2 puffs into the lungs 2 (two) times a day. Inhale 2 inhalations into the lungs 2 times daily PRN     . gabapentin (NEURONTIN) 100 MG capsule TAKE 1 CAPSULE(100 MG) BY MOUTH AT BEDTIME (Patient taking differently: Take 100 mg by mouth at bedtime. ) 30 capsule 6  . glucose blood (ONE TOUCH ULTRA TEST) test strip Use 1 each 2 (two) times daily. Use as instructed.    . glyBURIDE (DIABETA) 5 MG tablet Take 10 mg by mouth 2 (two) times daily with a meal.     . LEVEMIR FLEXTOUCH 100 UNIT/ML Pen Inject 55 Units into the skin daily.     Marland Kitchen loperamide (IMODIUM A-D) 2 MG capsule Take 2-4 mg by mouth 4 (four) times daily as needed for diarrhea or loose stools.     . mometasone (NASONEX) 50 MCG/ACT nasal spray Place 2 sprays into the nose daily as needed (Allergies).     . NOVOLOG FLEXPEN 100 UNIT/ML FlexPen Inject 7 Units into the skin 2 (two) times daily.     . ondansetron (ZOFRAN) 8 MG tablet One pill every 8 hours as needed for nausea/vomitting. (Patient taking differently: Take 8 mg by mouth every  8 (eight) hours as needed for nausea or vomiting. ) 40 tablet 1  . prednisoLONE acetate (PRED FORTE) 1 % ophthalmic suspension Place 1 drop into the left eye 2 (two) times daily.    .  prochlorperazine (COMPAZINE) 10 MG tablet Take 1 tablet (10 mg total) by mouth every 6 (six) hours as needed for nausea or vomiting. Please note change in strength (Patient taking differently: Take 10 mg by mouth every 6 (six) hours as needed for nausea or vomiting. ) 60 tablet 4  . salmeterol (SEREVENT) 50 MCG/DOSE diskus inhaler Inhale 1 puff into the lungs daily as needed (shortness of breath).     . simvastatin (ZOCOR) 20 MG tablet Take 20 mg by mouth daily at 6 PM.     . vitamin B-12 (CYANOCOBALAMIN) 1000 MCG tablet Take 1,000 mcg by mouth daily.     No current facility-administered medications for this visit.    Facility-Administered Medications Ordered in Other Visits  Medication Dose Route Frequency Provider Last Rate Last Dose  . sodium chloride flush (NS) 0.9 % injection 10 mL  10 mL Intravenous PRN Cammie Sickle, MD   10 mL at 01/30/16 1054    PHYSICAL EXAMINATION: ECOG PERFORMANCE STATUS: 1 - Symptomatic but completely ambulatory  BP (!) 155/94 (BP Location: Left Arm, Patient Position: Sitting, Cuff Size: Normal)   Pulse 70   Temp (!) 96.7 F (35.9 C) (Tympanic)   Resp 16   Wt 220 lb (99.8 kg)   BMI 38.97 kg/m   Filed Weights   04/01/19 0833  Weight: 220 lb (99.8 kg)    Physical Exam  Constitutional: She is oriented to person, place, and time and well-developed, well-nourished, and in no distress.  She is alone.  HENT:  Head: Normocephalic and atraumatic.  Mouth/Throat: Oropharynx is clear and moist. No oropharyngeal exudate.  Eyes: Pupils are equal, round, and reactive to light.  Neck: Normal range of motion. Neck supple.  Cardiovascular: Normal rate and regular rhythm.  Pulmonary/Chest: Breath sounds normal. No respiratory distress. She has no wheezes.  Abdominal: Soft. Bowel sounds are normal. She exhibits no distension and no mass. There is no abdominal tenderness. There is no rebound and no guarding.  Musculoskeletal: Normal range of motion.         General: Edema present. No tenderness.  Neurological: She is alert and oriented to person, place, and time.  Skin: Skin is warm.  Psychiatric: Affect normal.    LABORATORY DATA:  I have reviewed the data as listed    Component Value Date/Time   NA 138 04/01/2019 0802   NA 130 (L) 06/06/2014 1102   K 4.0 04/01/2019 0802   K 3.9 06/06/2014 1102   CL 104 04/01/2019 0802   CL 95 (L) 06/06/2014 1102   CO2 26 04/01/2019 0802   CO2 28 06/06/2014 1102   GLUCOSE 87 04/01/2019 0802   GLUCOSE 349 (H) 06/06/2014 1102   BUN 24 (H) 04/01/2019 0802   BUN 17 06/06/2014 1102   CREATININE 2.97 (H) 04/01/2019 0802   CREATININE 1.63 (H) 06/06/2014 1102   CALCIUM 8.2 (L) 04/01/2019 0802   CALCIUM 9.2 06/06/2014 1102   PROT 7.2 04/01/2019 0802   PROT 8.2 06/06/2014 1102   ALBUMIN 3.7 04/01/2019 0802   ALBUMIN 3.3 (L) 06/06/2014 1102   AST 18 04/01/2019 0802   AST 7 (L) 06/06/2014 1102   ALT 18 04/01/2019 0802   ALT 12 (L) 06/06/2014 1102   ALKPHOS 71 04/01/2019 0802  ALKPHOS 74 06/06/2014 1102   BILITOT 0.5 04/01/2019 0802   BILITOT 0.4 06/06/2014 1102   GFRNONAA 16 (L) 04/01/2019 0802   GFRNONAA 35 (L) 06/06/2014 1102   GFRAA 19 (L) 04/01/2019 0802   GFRAA 42 (L) 06/06/2014 1102    No results found for: SPEP, UPEP  Lab Results  Component Value Date   WBC 9.3 04/01/2019   NEUTROABS 6.7 04/01/2019   HGB 10.4 (L) 04/01/2019   HCT 30.1 (L) 04/01/2019   MCV 90.9 04/01/2019   PLT 339 04/01/2019      Chemistry      Component Value Date/Time   NA 138 04/01/2019 0802   NA 130 (L) 06/06/2014 1102   K 4.0 04/01/2019 0802   K 3.9 06/06/2014 1102   CL 104 04/01/2019 0802   CL 95 (L) 06/06/2014 1102   CO2 26 04/01/2019 0802   CO2 28 06/06/2014 1102   BUN 24 (H) 04/01/2019 0802   BUN 17 06/06/2014 1102   CREATININE 2.97 (H) 04/01/2019 0802   CREATININE 1.63 (H) 06/06/2014 1102      Component Value Date/Time   CALCIUM 8.2 (L) 04/01/2019 0802   CALCIUM 9.2 06/06/2014 1102    ALKPHOS 71 04/01/2019 0802   ALKPHOS 74 06/06/2014 1102   AST 18 04/01/2019 0802   AST 7 (L) 06/06/2014 1102   ALT 18 04/01/2019 0802   ALT 12 (L) 06/06/2014 1102   BILITOT 0.5 04/01/2019 0802   BILITOT 0.4 06/06/2014 1102       RADIOGRAPHIC STUDIES: I have personally reviewed the radiological images as listed and agreed with the findings in the report. No results found.   ASSESSMENT & PLAN:  Carcinoma of upper-inner quadrant of left breast in female, estrogen receptor positive ([place]) Left breast cancer- stage IV- ER/PR +, HER-2/neu - on Taxol chemotherapy; PET May 26th 2020- stable bone lesions/left breast mass; decreasing effusions/left lung atelectasis; right pelvic LN- improved.  Clinically stable. TM-80s.    # Proceed with Taxol every 2 weeks. Labs today reviewed;  acceptable for treatment today. Ordered PET scan today.   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   # Bil LE swelling/ededma MILD- discussed re: compression stocking/ leg- stable  # Chronic kidney disease - stage IV-creat-2.9; STABLE. Dr.Kolluru  # Anemia- hemoglobin today-9-10- STABLE  #  DISPOSITION:  # Treatment today # follow up in 2 weeks-] labs/MD-cbc/cmp-ca-27-29; Taxol;PET prior-  Dr.B   Orders Placed This Encounter  Procedures  . NM PET Image Restag (PS) Skull Base To Thigh    Standing Status:   Future    Standing Expiration Date:   03/31/2020    Order Specific Question:   ** REASON FOR EXAM (FREE TEXT)    Answer:   breast cancer    Order Specific Question:   If indicated for the ordered procedure, I authorize the administration of a radiopharmaceutical per Radiology protocol    Answer:   Yes    Order Specific Question:   Radiology Contrast Protocol - do NOT remove file path    Answer:   [email or web address]   All questions were answered. The patient knows to call the clinic with any problems, questions or concerns.      Cammie Sickle, MD 04/01/2019  9:08 AM

## 2019-04-02 LAB — CANCER ANTIGEN 27.29: CA 27.29: 85.6 U/mL — ABNORMAL HIGH (ref 0.0–38.6)

## 2019-04-12 ENCOUNTER — Other Ambulatory Visit: Payer: Self-pay

## 2019-04-12 ENCOUNTER — Ambulatory Visit
Admission: RE | Admit: 2019-04-12 | Discharge: 2019-04-12 | Disposition: A | Discharge status: Home / Self Care | Payer: Medicare HMO | Source: Ambulatory Visit | Attending: Internal Medicine | Admitting: Internal Medicine

## 2019-04-12 DIAGNOSIS — Z170 Estrogen receptor positive status [ER+]: Secondary | ICD-10-CM | POA: Diagnosis not present

## 2019-04-12 DIAGNOSIS — C50212 Malignant neoplasm of upper-inner quadrant of left female breast: Secondary | ICD-10-CM | POA: Diagnosis not present

## 2019-04-12 DIAGNOSIS — R918 Other nonspecific abnormal finding of lung field: Secondary | ICD-10-CM | POA: Diagnosis not present

## 2019-04-12 DIAGNOSIS — C7951 Secondary malignant neoplasm of bone: Secondary | ICD-10-CM | POA: Insufficient documentation

## 2019-04-12 DIAGNOSIS — E119 Type 2 diabetes mellitus without complications: Secondary | ICD-10-CM | POA: Insufficient documentation

## 2019-04-12 DIAGNOSIS — C50912 Malignant neoplasm of unspecified site of left female breast: Secondary | ICD-10-CM | POA: Diagnosis not present

## 2019-04-12 LAB — GLUCOSE, CAPILLARY: Glucose-Capillary: 70 mg/dL (ref 70–99)

## 2019-04-12 MED ORDER — FLUDEOXYGLUCOSE F - 18 (FDG) INJECTION
11.4000 | Freq: Once | INTRAVENOUS | Status: AC | PRN
  Administered 2019-04-12: 10.97 via INTRAVENOUS

## 2019-04-14 NOTE — Progress Notes (Signed)
Patient is doing well no complaints 

## 2019-04-15 ENCOUNTER — Other Ambulatory Visit: Payer: Self-pay

## 2019-04-15 ENCOUNTER — Inpatient Hospital Stay: Payer: Medicare HMO | Attending: Internal Medicine

## 2019-04-15 ENCOUNTER — Inpatient Hospital Stay (HOSPITAL_BASED_OUTPATIENT_CLINIC_OR_DEPARTMENT_OTHER): Payer: Medicare HMO | Admitting: Internal Medicine

## 2019-04-15 ENCOUNTER — Inpatient Hospital Stay: Payer: Medicare HMO

## 2019-04-15 ENCOUNTER — Encounter: Payer: Self-pay | Admitting: Internal Medicine

## 2019-04-15 VITALS — BP 157/85 | HR 74 | Resp 18

## 2019-04-15 DIAGNOSIS — F419 Anxiety disorder, unspecified: Secondary | ICD-10-CM | POA: Diagnosis not present

## 2019-04-15 DIAGNOSIS — F329 Major depressive disorder, single episode, unspecified: Secondary | ICD-10-CM | POA: Insufficient documentation

## 2019-04-15 DIAGNOSIS — Z7951 Long term (current) use of inhaled steroids: Secondary | ICD-10-CM | POA: Insufficient documentation

## 2019-04-15 DIAGNOSIS — Z7982 Long term (current) use of aspirin: Secondary | ICD-10-CM | POA: Insufficient documentation

## 2019-04-15 DIAGNOSIS — Z79899 Other long term (current) drug therapy: Secondary | ICD-10-CM | POA: Diagnosis not present

## 2019-04-15 DIAGNOSIS — C7951 Secondary malignant neoplasm of bone: Secondary | ICD-10-CM | POA: Diagnosis not present

## 2019-04-15 DIAGNOSIS — Z7189 Other specified counseling: Secondary | ICD-10-CM

## 2019-04-15 DIAGNOSIS — E119 Type 2 diabetes mellitus without complications: Secondary | ICD-10-CM | POA: Insufficient documentation

## 2019-04-15 DIAGNOSIS — Z170 Estrogen receptor positive status [ER+]: Secondary | ICD-10-CM | POA: Insufficient documentation

## 2019-04-15 DIAGNOSIS — J45909 Unspecified asthma, uncomplicated: Secondary | ICD-10-CM | POA: Diagnosis not present

## 2019-04-15 DIAGNOSIS — Z794 Long term (current) use of insulin: Secondary | ICD-10-CM | POA: Insufficient documentation

## 2019-04-15 DIAGNOSIS — C50212 Malignant neoplasm of upper-inner quadrant of left female breast: Secondary | ICD-10-CM | POA: Diagnosis not present

## 2019-04-15 DIAGNOSIS — Z5111 Encounter for antineoplastic chemotherapy: Secondary | ICD-10-CM | POA: Diagnosis not present

## 2019-04-15 DIAGNOSIS — N184 Chronic kidney disease, stage 4 (severe): Secondary | ICD-10-CM | POA: Diagnosis not present

## 2019-04-15 DIAGNOSIS — I10 Essential (primary) hypertension: Secondary | ICD-10-CM | POA: Insufficient documentation

## 2019-04-15 DIAGNOSIS — Z87891 Personal history of nicotine dependence: Secondary | ICD-10-CM | POA: Diagnosis not present

## 2019-04-15 LAB — COMPREHENSIVE METABOLIC PANEL WITH GFR
ALT: 14 U/L (ref 0–44)
AST: 16 U/L (ref 15–41)
Albumin: 4.1 g/dL (ref 3.5–5.0)
Alkaline Phosphatase: 66 U/L (ref 38–126)
Anion gap: 8 (ref 5–15)
BUN: 26 mg/dL — ABNORMAL HIGH (ref 8–23)
CO2: 25 mmol/L (ref 22–32)
Calcium: 8.2 mg/dL — ABNORMAL LOW (ref 8.9–10.3)
Chloride: 104 mmol/L (ref 98–111)
Creatinine, Ser: 2.65 mg/dL — ABNORMAL HIGH (ref 0.44–1.00)
GFR calc Af Amer: 22 mL/min — ABNORMAL LOW
GFR calc non Af Amer: 19 mL/min — ABNORMAL LOW
Glucose, Bld: 59 mg/dL — ABNORMAL LOW (ref 70–99)
Potassium: 3.9 mmol/L (ref 3.5–5.1)
Sodium: 137 mmol/L (ref 135–145)
Total Bilirubin: 0.7 mg/dL (ref 0.3–1.2)
Total Protein: 7.3 g/dL (ref 6.5–8.1)

## 2019-04-15 LAB — CBC WITH DIFFERENTIAL/PLATELET
Abs Immature Granulocytes: 0.17 K/uL — ABNORMAL HIGH (ref 0.00–0.07)
Basophils Absolute: 0.1 K/uL (ref 0.0–0.1)
Basophils Relative: 1 %
Eosinophils Absolute: 0.1 K/uL (ref 0.0–0.5)
Eosinophils Relative: 1 %
HCT: 30.9 % — ABNORMAL LOW (ref 36.0–46.0)
Hemoglobin: 10.6 g/dL — ABNORMAL LOW (ref 12.0–15.0)
Immature Granulocytes: 2 %
Lymphocytes Relative: 18 %
Lymphs Abs: 1.8 K/uL (ref 0.7–4.0)
MCH: 31.2 pg (ref 26.0–34.0)
MCHC: 34.3 g/dL (ref 30.0–36.0)
MCV: 90.9 fL (ref 80.0–100.0)
Monocytes Absolute: 1 K/uL (ref 0.1–1.0)
Monocytes Relative: 10 %
Neutro Abs: 7.1 K/uL (ref 1.7–7.7)
Neutrophils Relative %: 68 %
Platelets: 337 K/uL (ref 150–400)
RBC: 3.4 MIL/uL — ABNORMAL LOW (ref 3.87–5.11)
RDW: 12.6 % (ref 11.5–15.5)
WBC: 10.3 K/uL (ref 4.0–10.5)
nRBC: 0 % (ref 0.0–0.2)

## 2019-04-15 MED ORDER — DEXAMETHASONE SODIUM PHOSPHATE 10 MG/ML IJ SOLN
4.0000 mg | Freq: Once | INTRAMUSCULAR | Status: AC
  Administered 2019-04-15: 10:00:00 4 mg via INTRAVENOUS
  Filled 2019-04-15: qty 1

## 2019-04-15 MED ORDER — FAMOTIDINE IN NACL 20-0.9 MG/50ML-% IV SOLN
20.0000 mg | Freq: Once | INTRAVENOUS | Status: AC
  Administered 2019-04-15: 10:00:00 20 mg via INTRAVENOUS
  Filled 2019-04-15: qty 50

## 2019-04-15 MED ORDER — HEPARIN SOD (PORK) LOCK FLUSH 100 UNIT/ML IV SOLN
500.0000 [IU] | Freq: Once | INTRAVENOUS | Status: AC | PRN
  Administered 2019-04-15: 11:00:00 500 [IU]
  Filled 2019-04-15: qty 5

## 2019-04-15 MED ORDER — DIPHENHYDRAMINE HCL 50 MG/ML IJ SOLN
50.0000 mg | Freq: Once | INTRAMUSCULAR | Status: AC
  Administered 2019-04-15: 10:00:00 50 mg via INTRAVENOUS
  Filled 2019-04-15: qty 1

## 2019-04-15 MED ORDER — SODIUM CHLORIDE 0.9 % IV SOLN
60.0000 mg/m2 | Freq: Once | INTRAVENOUS | Status: AC
  Administered 2019-04-15: 10:00:00 114 mg via INTRAVENOUS
  Filled 2019-04-15: qty 19

## 2019-04-15 MED ORDER — SODIUM CHLORIDE 0.9 % IV SOLN
Freq: Once | INTRAVENOUS | Status: AC
  Administered 2019-04-15: 10:00:00 via INTRAVENOUS
  Filled 2019-04-15: qty 250

## 2019-04-15 NOTE — Assessment & Plan Note (Addendum)
Left breast cancer- stage IV- ER/PR +, HER-2/neu - on Taxol every 2 weeks.  Chemotherapy; PET scan April 12, 2019-mild progressive disease ; increased uptake pleural-based lesion/lung lesions; intense uptake right acetabular region otherwise stable bone lesions/left breast mass; tumor marker rising.    #Discussed the need for changing therapy given the mild progression of disease.  Would recommend eribulin-renally dosed [start at 1 mg/m]  We will plan to start in 2 weeks.  Discussed the potential side effects include neutropenia/low blood counts; neuropathy.  #Proceed with Taxol today; Labs today reviewed;  acceptable for treatment today.   # Bone mets-sclerotic; Right acetabular uptake- but asymptomatic.   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  Recommend try PPI/omperazole; stop pepcid  # DISPOSITION:  # taxol Treatment today  # follow up in 2 weeks-] labs/MD-cbc/cmp-ca-27-29; Eribulin [new];-  Dr.B  # I reviewed the blood work- with the patient in detail; also reviewed the imaging independently [as summarized above]; and with the patient in detail.   # 40 minutes face-to-face with the patient discussing the above plan of care; more than 50% of time spent on prognosis/ natural history; counseling and coordination.

## 2019-04-15 NOTE — Patient Instructions (Signed)
#   Take omeprazole 20 mg OTC 1 hour prior BF; and stop pepcid/famotidine

## 2019-04-15 NOTE — Progress Notes (Signed)
[facility] OFFICE PROGRESS NOTE  Patient Care Team: [name], [name], MD as Medical Oncologist (Medical Oncology) Corey Skains, MD as Consulting Physician (Cardiology)  Cancer Staging No matching staging information was found for the patient.   Oncology History Overview Note  # OCT 2015-STAGE IV LEFT BREAST T2N1 [T=4cm; [name]; [name]; [name]; EBUS- Positive Paratrac/subcarinal LN s/p ? Taxotere [in [place]; [name]; [name];[name]; [name]; suv 3]; [name]; [name]; [name];  [name]; Ant R iliac bone; [name]; [name]; START-Taxol weekly. March 2020- Taxol every 2 weeks [PN]  # Poorly controlled Blood sugars- improved.   # Pancreatitis Hx/PEI- on creon in past / CKD IV [creat ~ 3-4; [name]; [name]; mild left sided weakness]  # Jan 2020-  Lobular lesion on tongue- s/p excision pyogenic granuloma [Dr.McQueen]   # GENETIC TESTING/COUNSELLING: HETEROZYGOUS Cystic Fibrosis Gene [explains hx of recurrent pancreatitis]  # MOLECULAR TESTING: NA  ------------------------------------------------   DIAGNOSIS: [ 5038] BREAST CA; [name]; [name] ;GOALS: Palliative  CURRENT/MOST RECENT THERAPY [ April 2019] TAXOL    Carcinoma of upper-inner quadrant of left breast in female, estrogen receptor positive ([place])  04/29/2019 -  Chemotherapy   The patient had eriBULin mesylate ([place]) 2.1 mg in  sodium chloride 0.9 % 100 mL chemo infusion, 1 mg/m2 = 2.1 mg (original dose ), Intravenous,  Once, 0 of 4 cycles Dose modification: 1 mg/m2 (Cycle 1, Reason: Provider Judgment)  for chemotherapy treatment.     INTERVAL HISTORY:  [name] 63 y.o.  female pleasant patient above history of ER PR positive HER-2 negative breast cancer on Taxol is here for follow-up/review results of the PET scan.  Patient denies any hip pain.  Denies any new onset of back pain.  No nausea no vomiting but no headaches.  Chronic swelling in the legs not any worse.  Chronic mild tingling and numbness in extremities.  This is not any worse.   Complains of mild abdominal discomfort in the epigastric region over the last few weeks.  Prior history of pancreatitis; but no flareup noted at this time.  Patient is on H2 antagonist.  Review of Systems  Constitutional: Positive for malaise/fatigue. Negative for chills, diaphoresis, [name], hemoptysis, sputum production, shortness of breath and wheezing.   Cardiovascular: Positive for leg swelling. Negative for chest pain, palpitations and orthopnea.  Gastrointestinal: Negative for abdominal pain, blood in stool, constipation, diarrhea, heartburn, melena, nausea and vomiting.  Genitourinary: Negative for dysuria, frequency and urgency.  Musculoskeletal: Negative for back pain and joint pain.  Skin: Negative.  Negative for itching and rash.  Neurological: Positive for tingling. Negative for dizziness, focal weakness, weakness and headaches.  Endo/Heme/Allergies: Does not bruise/bleed easily.  Psychiatric/Behavioral: Negative for depression. The patient is not nervous/anxious and does not have insomnia.     PAST MEDICAL HISTORY :  Past Medical History:  Diagnosis Date  . Anemia   . Anxiety   . Asthma   . Cancer ([place]) 03/10/2018    Per [facility] PET order. Carcinoma of upper-inner quadrant of left breast in female, estrogen receptor positive .  Marland Kitchen Cancer (HCC)    LUNG  . CHF (congestive heart failure) ([place]) 1997  . CKD (chronic kidney disease)   . Depression   . Diabetes mellitus, type 2 ([place])   . Family history of breast cancer   . Family history of colon cancer   . Family history of ovarian cancer   . Family history of pancreatic cancer   . Family history of prostate cancer   . Family history of stomach cancer   . GERD (gastroesophageal reflux disease)    history of an ulcer  . Hair loss   . History of left breast cancer 05/29/14  . History of partial hysterectomy 12/31/2016   Per patient.  Has not had a period in years.  Had a partial hysterectomy years ago.  Marland Kitchen Hypertension   . Mitral valve regurgitation   . Neuromuscular disorder (HCC)    neuropathies in hand  . Obesity   . Pancreatitis 1997  . Stroke [facility]) 2010   with mild left arm weakness    PAST SURGICAL HISTORY :   Past Surgical History:  Procedure Laterality Date  . CATARACT EXTRACTION W/PHACO Right 02/24/2019   Procedure: CATARACT EXTRACTION PHACO AND INTRAOCULAR LENS PLACEMENT ([place]) RIGHT DIABETES;  Surgeon: Marchia Meiers, MD;  Location: [facility] ORS;  Service: Ophthalmology;  Laterality: Right;  [place] 01:13.0 CDE 7.96 Fluid Pack Lot # [id number]  . CATARACT EXTRACTION W/PHACO Left 03/24/2019   Procedure: CATARACT EXTRACTION PHACO AND INTRAOCULAR LENS PLACEMENT ([facility]) - left diabetic;  Surgeon: Marchia Meiers, MD;  Location: [facility] ORS;  Service: Ophthalmology;  Laterality: Left;  [place]  01:36 CDE 13.93 Fluid pack lot # [id number]  . CESAREAN SECTION    . CHOLECYSTECTOMY    . EXCISION OF TONGUE LESION N/A 08/17/2018   Procedure: EXCISION OF TONGUE LESION WITH FROZEN SECTIONS;  Surgeon: Beverly Gust, MD;  Location: [facility] ORS;  Service: ENT;  Laterality: N/A;  . EYE SURGERY Right    cataract extraction  . PARTIAL HYSTERECTOMY  12/31/2016   Per patient, she has  not had a period in years since she had a partial hysterectomy.  Marland Kitchen PORTA CATH INSERTION    . TUBAL LIGATION      FAMILY HISTORY :   Family History  Problem Relation Age of Onset  . Ovarian cancer Mother 69  . Diabetes Mother   . Hypertension Mother   . COPD Father   . Hypertension Father   . Colon cancer Father 68  . Diabetes Sister   . Breast cancer Sister 77       bilateral  . Diabetes Brother   . Leukemia Maternal Aunt   . Pancreatic cancer Paternal Aunt 63  . Pancreatic cancer Paternal Uncle   . Colon cancer Paternal Uncle   . Stomach cancer Maternal Grandfather 50  . Throat cancer Paternal Grandmother   . Breast cancer Maternal Aunt 80  . Colon cancer Maternal Aunt   . Bone cancer Maternal Aunt   . Breast cancer Paternal Aunt        dx >50  . Prostate cancer Paternal Uncle   . Pancreatic cancer Paternal Uncle   . Throat cancer Paternal Uncle   . Lung cancer Paternal Uncle   . Stomach cancer Paternal  Uncle   . Brain cancer Paternal Aunt   . Cancer Cousin        liver, kidney  . Prostate cancer Cousin        meastatic  . Lung cancer Other     SOCIAL HISTORY:   Social History   Tobacco Use  . Smoking status: Former Smoker    Packs/day: 0.50    Years: 1.00    Pack years: 0.50    Types: Cigarettes  . Smokeless tobacco: Never Used  Substance Use Topics  . Alcohol use: No    Alcohol/week: 0.0 standard drinks  . Drug use: No    ALLERGIES:  has No Known Allergies.  MEDICATIONS:  Current Outpatient Medications  Medication Sig Dispense Refill  . acetaminophen-codeine (TYLENOL #4) 300-60 MG tablet Take 1-2 tablets by mouth every 4 (four) hours as needed for moderate pain.     Marland Kitchen albuterol (PROAIR HFA) 108 (90 BASE) MCG/ACT inhaler Inhale 2 puffs into the lungs every 6 (six) hours as needed for wheezing or shortness of breath.     Marland Kitchen albuterol (PROVENTIL) (2.5 MG/3ML) 0.083% nebulizer solution Inhale 2.5 mg into the lungs every 6 (six) hours as needed for  shortness of breath.     . ALPRAZolam (XANAX) 0.5 MG tablet Take 0.5 mg by mouth at bedtime as needed for anxiety or sleep.     Marland Kitchen amLODipine (NORVASC) 10 MG tablet Take 10 mg by mouth daily.     Marland Kitchen aspirin EC 81 MG tablet Take 81 mg by mouth daily.     Marland Kitchen atenolol (TENORMIN) 100 MG tablet Take 100 mg by mouth 2 (two) times daily.     . B-D ULTRA-FINE 33 LANCETS MISC Use 1 each 2 (two) times daily.    . B-D ULTRAFINE III SHORT PEN 31G X 8 MM MISC     . bumetanide (BUMEX) 0.5 MG tablet Take 0.5 mg by mouth 2 (two) times daily.     . calcitRIOL (ROCALTROL) 0.25 MCG capsule Take 0.25 mcg by mouth every Monday, Wednesday, and Friday.     . Cinnamon 500 MG capsule Take 1,000 mg by mouth daily.     . cloNIDine (CATAPRES) 0.2 MG tablet Take 0.2 mg by mouth 2 (two) times daily.     . enalapril (VASOTEC) 10 MG tablet Take 20 mg by mouth 2 (two) times a day.     . famotidine (PEPCID) 20 MG tablet Take 20 mg by mouth 2 (two) times daily.    . ferrous sulfate 325 (65 FE) MG tablet Take 325 mg by mouth 2 (two) times daily with a meal.    . FLUoxetine (PROZAC) 20 MG capsule Take 20 mg by mouth 2 (two) times daily.     . Fluticasone Propionate, Inhal, (FLOVENT DISKUS) 100 MCG/BLIST AEPB Inhale 2 puffs into the lungs 2 (two) times a day. Inhale 2 inhalations into the lungs 2 times daily PRN     . gabapentin (NEURONTIN) 100 MG capsule TAKE 1 CAPSULE(100 MG) BY MOUTH AT BEDTIME (Patient taking differently: Take 100 mg by mouth at bedtime. ) 30 capsule 6  . glucose blood (ONE TOUCH ULTRA TEST) test strip Use 1 each 2 (two) times daily. Use as instructed.    . glyBURIDE (DIABETA) 5 MG tablet Take 10 mg by mouth 2 (two) times daily with a meal.     . LEVEMIR FLEXTOUCH 100 UNIT/ML Pen Inject 55 Units into the skin daily.     Marland Kitchen loperamide (IMODIUM A-D)  2 MG capsule Take 2-4 mg by mouth 4 (four) times daily as needed for diarrhea or loose stools.     . mometasone (NASONEX) 50 MCG/ACT nasal spray Place 2 sprays into the  nose daily as needed (Allergies).     . NOVOLOG FLEXPEN 100 UNIT/ML FlexPen Inject 7 Units into the skin 2 (two) times daily.     . ondansetron (ZOFRAN) 8 MG tablet One pill every 8 hours as needed for nausea/vomitting. (Patient taking differently: Take 8 mg by mouth every 8 (eight) hours as needed for nausea or vomiting. ) 40 tablet 1  . prednisoLONE acetate (PRED FORTE) 1 % ophthalmic suspension Place 1 drop into the left eye 2 (two) times daily.    . prochlorperazine (COMPAZINE) 10 MG tablet Take 1 tablet (10 mg total) by mouth every 6 (six) hours as needed for nausea or vomiting. Please note change in strength (Patient taking differently: Take 10 mg by mouth every 6 (six) hours as needed for nausea or vomiting. ) 60 tablet 4  . salmeterol (SEREVENT) 50 MCG/DOSE diskus inhaler Inhale 1 puff into the lungs daily as needed (shortness of breath).     . simvastatin (ZOCOR) 20 MG tablet Take 20 mg by mouth daily at 6 PM.     . vitamin B-12 (CYANOCOBALAMIN) 1000 MCG tablet Take 1,000 mcg by mouth daily.     No current facility-administered medications for this visit.    Facility-Administered Medications Ordered in Other Visits  Medication Dose Route Frequency Provider Last Rate Last Dose  . sodium chloride flush (NS) 0.9 % injection 10 mL  10 mL Intravenous PRN Cammie Sickle, MD   10 mL at 01/30/16 1054    PHYSICAL EXAMINATION: ECOG PERFORMANCE STATUS: 1 - Symptomatic but completely ambulatory  BP (!) 153/89 (BP Location: Right Arm, Patient Position: Sitting)   Pulse 72   Temp (!) 95.9 F (35.5 C)   Resp 18   Wt 216 lb 9.6 oz (98.2 kg)   BMI 38.37 kg/m   Filed Weights   04/15/19 0845  Weight: 216 lb 9.6 oz (98.2 kg)    Physical Exam  Constitutional: She is oriented to person, place, and time and well-developed, well-nourished, and in no distress.  She is alone.  HENT:  Head: Normocephalic and atraumatic.  Mouth/Throat: Oropharynx is clear and moist. No oropharyngeal  exudate.  Eyes: Pupils are equal, round, and reactive to light.  Neck: Normal range of motion. Neck supple.  Cardiovascular: Normal rate and regular rhythm.  Pulmonary/Chest: Breath sounds normal. No respiratory distress. She has no wheezes.  Abdominal: Soft. Bowel sounds are normal. She exhibits no distension and no mass. There is no abdominal tenderness. There is no rebound and no guarding.  Musculoskeletal: Normal range of motion.        General: Edema present. No tenderness.  Neurological: She is alert and oriented to person, place, and time.  Skin: Skin is warm.  Psychiatric: Affect normal.    LABORATORY DATA:  I have reviewed the data as listed    Component Value Date/Time   NA 137 04/15/2019 0808   NA 130 (L) 06/06/2014 1102   K 3.9 04/15/2019 0808   K 3.9 06/06/2014 1102   CL 104 04/15/2019 0808   CL 95 (L) 06/06/2014 1102   CO2 25 04/15/2019 0808   CO2 28 06/06/2014 1102   GLUCOSE 59 (L) 04/15/2019 0808   GLUCOSE 349 (H) 06/06/2014 1102   BUN 26 (H) 04/15/2019 0808   BUN  17 06/06/2014 1102   CREATININE 2.65 (H) 04/15/2019 0808   CREATININE 1.63 (H) 06/06/2014 1102   CALCIUM 8.2 (L) 04/15/2019 0808   CALCIUM 9.2 06/06/2014 1102   PROT 7.3 04/15/2019 0808   PROT 8.2 06/06/2014 1102   ALBUMIN 4.1 04/15/2019 0808   ALBUMIN 3.3 (L) 06/06/2014 1102   AST 16 04/15/2019 0808   AST 7 (L) 06/06/2014 1102   ALT 14 04/15/2019 0808   ALT 12 (L) 06/06/2014 1102   ALKPHOS 66 04/15/2019 0808   ALKPHOS 74 06/06/2014 1102   BILITOT 0.7 04/15/2019 0808   BILITOT 0.4 06/06/2014 1102   GFRNONAA 19 (L) 04/15/2019 0808   GFRNONAA 35 (L) 06/06/2014 1102   GFRAA 22 (L) 04/15/2019 0808   GFRAA 42 (L) 06/06/2014 1102    No results found for: SPEP, UPEP  Lab Results  Component Value Date   WBC 10.3 04/15/2019   NEUTROABS 7.1 04/15/2019   HGB 10.6 (L) 04/15/2019   HCT 30.9 (L) 04/15/2019   MCV 90.9 04/15/2019   PLT 337 04/15/2019      Chemistry      Component Value  Date/Time   NA 137 04/15/2019 0808   NA 130 (L) 06/06/2014 1102   K 3.9 04/15/2019 0808   K 3.9 06/06/2014 1102   CL 104 04/15/2019 0808   CL 95 (L) 06/06/2014 1102   CO2 25 04/15/2019 0808   CO2 28 06/06/2014 1102   BUN 26 (H) 04/15/2019 0808   BUN 17 06/06/2014 1102   CREATININE 2.65 (H) 04/15/2019 0808   CREATININE 1.63 (H) 06/06/2014 1102      Component Value Date/Time   CALCIUM 8.2 (L) 04/15/2019 0808   CALCIUM 9.2 06/06/2014 1102   ALKPHOS 66 04/15/2019 0808   ALKPHOS 74 06/06/2014 1102   AST 16 04/15/2019 0808   AST 7 (L) 06/06/2014 1102   ALT 14 04/15/2019 0808   ALT 12 (L) 06/06/2014 1102   BILITOT 0.7 04/15/2019 0808   BILITOT 0.4 06/06/2014 1102       RADIOGRAPHIC STUDIES: I have personally reviewed the radiological images as listed and agreed with the findings in the report. No results found.   ASSESSMENT & PLAN:  Carcinoma of upper-inner quadrant of left breast in female, estrogen receptor positive ([place]) Left breast cancer- stage IV- ER/PR +, HER-2/neu - on Taxol every 2 weeks.  Chemotherapy; PET scan April 12, 2019-mild progressive disease ; increased uptake pleural-based lesion/lung lesions; intense uptake right acetabular region otherwise stable bone lesions/left breast mass; tumor marker rising.    #Discussed the need for changing therapy given the mild progression of disease.  Would recommend eribulin-renally dosed [start at 1 mg/m]  We will plan to start in 2 weeks.  Discussed the potential side effects include neutropenia/low blood counts; neuropathy.  #Proceed with Taxol today; Labs today reviewed;  acceptable for treatment today.   # Bone mets-sclerotic; Right acetabular uptake- but asymptomatic.   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  Recommend try PPI/omperazole; stop pepcid  # DISPOSITION:  # taxol Treatment today  # follow up in 2 weeks-] labs/MD-cbc/cmp-ca-27-29; Eribulin [new];-  Dr.B  # I reviewed the blood work- with the patient in detail; also reviewed the imaging independently [as summarized above]; and with the patient in detail.   # 40  minutes face-to-face with the patient discussing the above plan of care; more than 50% of time spent on prognosis/ natural history; counseling and coordination.    No orders of the defined types were placed in this encounter.  All questions were answered. The patient knows to call the clinic with any problems, questions or concerns.      Cammie Sickle, MD 04/19/2019 7:19 AM

## 2019-04-15 NOTE — Progress Notes (Signed)
Pt in for follow up and treatment today.  Reports having pain in left knee and abdominal area. Pt reports had flu shot on labor day.

## 2019-04-16 LAB — CANCER ANTIGEN 27.29: CA 27.29: 87.2 U/mL — ABNORMAL HIGH (ref 0.0–38.6)

## 2019-04-19 NOTE — Progress Notes (Signed)
DISCONTINUE ON PATHWAY REGIMEN - Breast     A cycle is every 28 days (3 weeks on and 1 week off):     Paclitaxel   **Always confirm dose/schedule in your pharmacy ordering system**  REASON: Disease Progression PRIOR TREATMENT: [id number]: Paclitaxel 80 mg/m2 D1, 8, 15 q28 Days Until Progression or Unacceptable Toxicity TREATMENT RESPONSE: Progressive Disease (PD)  START ON PATHWAY REGIMEN - Breast     A cycle is every 21 days:     Eribulin mesylate   **Always confirm dose/schedule in your pharmacy ordering system**  Patient Characteristics: Distant Metastases or Locoregional Recurrent Disease - Unresected or Locally Advanced Unresectable Disease Progressing after Neoadjuvant and Local Therapies, HER2 Negative/Unknown/Equivocal, ER Positive, Chemotherapy, Second Line Therapeutic Status: Distant Metastases BRCA Mutation Status: Awaiting Test Results ER Status: Positive (+) HER2 Status: Negative (-) PR Status: Positive (+) Line of Therapy: Second Line Intent of Therapy: Non-Curative / Palliative Intent, Discussed with Patient

## 2019-04-27 DIAGNOSIS — E871 Hypo-osmolality and hyponatremia: Secondary | ICD-10-CM | POA: Diagnosis not present

## 2019-04-27 DIAGNOSIS — D631 Anemia in chronic kidney disease: Secondary | ICD-10-CM | POA: Diagnosis not present

## 2019-04-27 DIAGNOSIS — D638 Anemia in other chronic diseases classified elsewhere: Secondary | ICD-10-CM | POA: Insufficient documentation

## 2019-04-27 DIAGNOSIS — R809 Proteinuria, unspecified: Secondary | ICD-10-CM | POA: Insufficient documentation

## 2019-04-27 DIAGNOSIS — N185 Chronic kidney disease, stage 5: Secondary | ICD-10-CM | POA: Diagnosis not present

## 2019-04-27 DIAGNOSIS — I120 Hypertensive chronic kidney disease with stage 5 chronic kidney disease or end stage renal disease: Secondary | ICD-10-CM | POA: Diagnosis not present

## 2019-04-27 DIAGNOSIS — E1122 Type 2 diabetes mellitus with diabetic chronic kidney disease: Secondary | ICD-10-CM | POA: Diagnosis not present

## 2019-04-27 DIAGNOSIS — N2581 Secondary hyperparathyroidism of renal origin: Secondary | ICD-10-CM | POA: Insufficient documentation

## 2019-04-27 DIAGNOSIS — I129 Hypertensive chronic kidney disease with stage 1 through stage 4 chronic kidney disease, or unspecified chronic kidney disease: Secondary | ICD-10-CM | POA: Insufficient documentation

## 2019-04-29 ENCOUNTER — Inpatient Hospital Stay (HOSPITAL_BASED_OUTPATIENT_CLINIC_OR_DEPARTMENT_OTHER): Payer: Medicare HMO | Admitting: Internal Medicine

## 2019-04-29 ENCOUNTER — Other Ambulatory Visit: Payer: Self-pay

## 2019-04-29 ENCOUNTER — Inpatient Hospital Stay: Payer: Medicare HMO

## 2019-04-29 DIAGNOSIS — C50212 Malignant neoplasm of upper-inner quadrant of left female breast: Secondary | ICD-10-CM

## 2019-04-29 DIAGNOSIS — Z95828 Presence of other vascular implants and grafts: Secondary | ICD-10-CM

## 2019-04-29 DIAGNOSIS — E119 Type 2 diabetes mellitus without complications: Secondary | ICD-10-CM | POA: Diagnosis not present

## 2019-04-29 DIAGNOSIS — Z170 Estrogen receptor positive status [ER+]: Secondary | ICD-10-CM

## 2019-04-29 DIAGNOSIS — I10 Essential (primary) hypertension: Secondary | ICD-10-CM | POA: Diagnosis not present

## 2019-04-29 DIAGNOSIS — Z7189 Other specified counseling: Secondary | ICD-10-CM

## 2019-04-29 DIAGNOSIS — N184 Chronic kidney disease, stage 4 (severe): Secondary | ICD-10-CM | POA: Diagnosis not present

## 2019-04-29 DIAGNOSIS — C7951 Secondary malignant neoplasm of bone: Secondary | ICD-10-CM | POA: Diagnosis not present

## 2019-04-29 DIAGNOSIS — F419 Anxiety disorder, unspecified: Secondary | ICD-10-CM | POA: Diagnosis not present

## 2019-04-29 DIAGNOSIS — F329 Major depressive disorder, single episode, unspecified: Secondary | ICD-10-CM | POA: Diagnosis not present

## 2019-04-29 DIAGNOSIS — Z5111 Encounter for antineoplastic chemotherapy: Secondary | ICD-10-CM | POA: Diagnosis not present

## 2019-04-29 LAB — COMPREHENSIVE METABOLIC PANEL
ALT: 14 U/L (ref 0–44)
AST: 15 U/L (ref 15–41)
Albumin: 3.8 g/dL (ref 3.5–5.0)
Alkaline Phosphatase: 72 U/L (ref 38–126)
Anion gap: 9 (ref 5–15)
BUN: 20 mg/dL (ref 8–23)
CO2: 25 mmol/L (ref 22–32)
Calcium: 8.6 mg/dL — ABNORMAL LOW (ref 8.9–10.3)
Chloride: 103 mmol/L (ref 98–111)
Creatinine, Ser: 2.59 mg/dL — ABNORMAL HIGH (ref 0.44–1.00)
GFR calc Af Amer: 22 mL/min — ABNORMAL LOW (ref >60)
GFR calc non Af Amer: 19 mL/min — ABNORMAL LOW (ref >60)
Glucose, Bld: 81 mg/dL (ref 70–99)
Potassium: 3.6 mmol/L (ref 3.5–5.1)
Sodium: 137 mmol/L (ref 135–145)
Total Bilirubin: 0.8 mg/dL (ref 0.3–1.2)
Total Protein: 7.5 g/dL (ref 6.5–8.1)

## 2019-04-29 LAB — CBC WITH DIFFERENTIAL/PLATELET
Abs Immature Granulocytes: 0.16 10*3/uL — ABNORMAL HIGH (ref 0.00–0.07)
Basophils Absolute: 0.1 10*3/uL (ref 0.0–0.1)
Basophils Relative: 1 %
Eosinophils Absolute: 0.2 10*3/uL (ref 0.0–0.5)
Eosinophils Relative: 2 %
HCT: 30.2 % — ABNORMAL LOW (ref 36.0–46.0)
Hemoglobin: 10.2 g/dL — ABNORMAL LOW (ref 12.0–15.0)
Immature Granulocytes: 2 %
Lymphocytes Relative: 18 %
Lymphs Abs: 1.6 10*3/uL (ref 0.7–4.0)
MCH: 30.9 pg (ref 26.0–34.0)
MCHC: 33.8 g/dL (ref 30.0–36.0)
MCV: 91.5 fL (ref 80.0–100.0)
Monocytes Absolute: 1 10*3/uL (ref 0.1–1.0)
Monocytes Relative: 12 %
Neutro Abs: 5.6 10*3/uL (ref 1.7–7.7)
Neutrophils Relative %: 65 %
Platelets: 335 10*3/uL (ref 150–400)
RBC: 3.3 MIL/uL — ABNORMAL LOW (ref 3.87–5.11)
RDW: 12.7 % (ref 11.5–15.5)
WBC: 8.6 10*3/uL (ref 4.0–10.5)
nRBC: 0.2 % (ref 0.0–0.2)

## 2019-04-29 MED ORDER — HEPARIN SOD (PORK) LOCK FLUSH 100 UNIT/ML IV SOLN
500.0000 [IU] | Freq: Once | INTRAVENOUS | Status: AC | PRN
  Administered 2019-04-29: 10:00:00 500 [IU]
  Filled 2019-04-29: qty 5

## 2019-04-29 MED ORDER — SODIUM CHLORIDE 0.9% FLUSH
10.0000 mL | Freq: Once | INTRAVENOUS | Status: AC
  Administered 2019-04-29: 10 mL via INTRAVENOUS
  Filled 2019-04-29: qty 10

## 2019-04-29 MED ORDER — SODIUM CHLORIDE 0.9 % IV SOLN
Freq: Once | INTRAVENOUS | Status: AC
  Administered 2019-04-29: 09:00:00 via INTRAVENOUS
  Filled 2019-04-29: qty 250

## 2019-04-29 MED ORDER — SODIUM CHLORIDE 0.9 % IV SOLN
2.0000 mg | Freq: Once | INTRAVENOUS | Status: AC
  Administered 2019-04-29: 2 mg via INTRAVENOUS
  Filled 2019-04-29: qty 4

## 2019-04-29 MED ORDER — PROCHLORPERAZINE MALEATE 10 MG PO TABS
10.0000 mg | ORAL_TABLET | Freq: Once | ORAL | Status: AC
  Administered 2019-04-29: 10 mg via ORAL
  Filled 2019-04-29: qty 1

## 2019-04-29 MED ORDER — SODIUM CHLORIDE 0.9 % IV SOLN
1.0000 mg/m2 | Freq: Once | INTRAVENOUS | Status: DC
  Filled 2019-04-29: qty 4.2

## 2019-04-29 NOTE — Assessment & Plan Note (Addendum)
Left breast cancer- stage IV- ER/PR +, HER-2/neu.  PET scan April 12, 2019-mild progressive disease ; increased uptake pleural-based lesion/lung lesions; intense uptake right acetabular region otherwise stable bone lesions/left breast mass; tumor marker rising.  Proceed with eribulin.  # proceed with eribulin-renally dosed [start at 1 mg/m].Labs today reviewed;  acceptable for treatment today.  Discussed the potential side effects include neutropenia/low blood counts; neuropathy.  # Bone mets-sclerotic; Right acetabular uptake- but asymptomatic. 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   # DISPOSITION:  # eribulin Treatment today  # eribulin- 1 week- cbc/bmp  # follow up in 3 weeks labs/MD-cbc/cmp-ca-27-29; Eribulin - Dr.B

## 2019-04-29 NOTE — Progress Notes (Signed)
[facility] OFFICE PROGRESS NOTE  Patient Care Team: [name], [name], MD as Medical Oncologist (Medical Oncology) Corey Skains, MD as Consulting Physician (Cardiology)  Cancer Staging No matching staging information was found for the patient.   Oncology History Overview Note  # OCT 2015-STAGE IV LEFT BREAST T2N1 [T=4cm; [name]; [name]; [name]; EBUS- Positive Paratrac/subcarinal LN s/p ? Taxotere [in [place]; [name]; [name];[name]; [name]; suv 3]; [name]; [name]; [name];  [name]; Ant R iliac bone; [name]; [name]; START-Taxol weekly. March 2020- Taxol every 2 weeks [PN]  # Poorly controlled Blood sugars- improved.   # Pancreatitis Hx/PEI- on creon in past / CKD IV [creat ~ 3-4; [name]; [name]; mild left sided weakness]  # Jan 2020-  Lobular lesion on tongue- s/p excision pyogenic granuloma [Dr.McQueen]   # GENETIC TESTING/COUNSELLING: HETEROZYGOUS Cystic Fibrosis Gene [explains hx of recurrent pancreatitis]  # MOLECULAR TESTING: NA  ------------------------------------------------   DIAGNOSIS: [ 7408] BREAST CA; [name]; [name] ;GOALS: Palliative  CURRENT/MOST RECENT THERAPY [ April 2019] TAXOL    Carcinoma of upper-inner quadrant of left breast in female, estrogen receptor positive ([place])  04/29/2019 -  Chemotherapy   The patient had eriBULin mesylate ([place]) 2 mg in sodium  chloride 0.9 % 100 mL chemo infusion, 2 mg, Intravenous,  Once, 1 of 4 cycles Dose modification: 1 mg/m2 (original dose 1 mg/m2, Cycle 1, Reason: Provider Judgment) Administration: 2 mg (04/29/2019)  for chemotherapy treatment.     INTERVAL HISTORY:  [name] 63 y.o.  female pleasant patient above history of metastatic ER PR positive HER-2 negative breast cancer-noted to have progressive disease on Taxol on a PET scan.  Patient is here to proceed with eribulin.  The interim patient was evaluated by nephrology.  Appetite is good.  No nausea no vomiting.  Denies any worsening hip pain.  No back pain.  No significant nausea vomiting.  Abdominal pain improved.  She is currently on omeprazole.  Chronic swelling in the legs.  Review of Systems  Constitutional: Positive for malaise/fatigue. Negative for chills, diaphoresis, [name], hemoptysis, sputum production, shortness of breath and wheezing.   Cardiovascular: Positive for leg swelling. Negative for chest pain, palpitations and orthopnea.  Gastrointestinal: Negative for abdominal pain, blood in stool, constipation, diarrhea, heartburn, melena, nausea and vomiting.  Genitourinary: Negative for dysuria, frequency and urgency.  Musculoskeletal: Negative for back pain and joint pain.  Skin: Negative.  Negative for itching and rash.  Neurological: Positive for tingling. Negative for dizziness, focal weakness, weakness and headaches.  Endo/Heme/Allergies: Does not bruise/bleed easily.  Psychiatric/Behavioral: Negative for depression. The patient is not nervous/anxious and does not have insomnia.     PAST MEDICAL HISTORY :  Past Medical History:  Diagnosis Date  . Anemia   . Anxiety   . Asthma   .  Cancer ([place]) 03/10/2018   Per NM PET order. Carcinoma of upper-inner quadrant of left breast in female,  estrogen receptor positive .  Marland Kitchen Cancer (HCC)    LUNG  . CHF (congestive heart failure) ([place]) 1997  . CKD (chronic kidney disease)   . Depression   . Diabetes mellitus, type 2 ([place])   . Family history of breast cancer   . Family history of colon cancer   . Family history of ovarian cancer   . Family history of pancreatic cancer   . Family history of prostate cancer   . Family history of stomach cancer   . GERD (gastroesophageal reflux disease)    history of an ulcer  . Hair loss   . History of left breast cancer 05/29/14  . History of partial hysterectomy 12/31/2016   Per patient.  Has not had a period in years.  Had a partial hysterectomy years ago.  Marland Kitchen Hypertension   . Mitral valve regurgitation   . Neuromuscular disorder (HCC)    neuropathies in hand  . Obesity   . Pancreatitis 1997  . Stroke [facility]) 2010   with mild left arm weakness    PAST SURGICAL HISTORY :   Past Surgical History:  Procedure Laterality Date  . CATARACT EXTRACTION W/PHACO Right 02/24/2019   Procedure: CATARACT EXTRACTION PHACO AND INTRAOCULAR LENS PLACEMENT ([place]) RIGHT DIABETES;  Surgeon: Marchia Meiers, MD;  Location: [facility] ORS;  Service: Ophthalmology;  Laterality: Right;  [place] 01:13.0 CDE 7.96 Fluid Pack Lot # [id number]  . CATARACT EXTRACTION W/PHACO Left 03/24/2019   Procedure: CATARACT EXTRACTION PHACO AND INTRAOCULAR LENS PLACEMENT ([facility]) - left diabetic;  Surgeon: Marchia Meiers, MD;  Location: [facility] ORS;  Service: Ophthalmology;  Laterality: Left;  [place]  01:36 CDE 13.93 Fluid pack lot # [id number]  . CESAREAN SECTION    . CHOLECYSTECTOMY    . EXCISION OF TONGUE LESION N/A 08/17/2018   Procedure: EXCISION OF TONGUE LESION WITH FROZEN SECTIONS;  Surgeon: Beverly Gust, MD;  Location: [facility] ORS;  Service: ENT;  Laterality: N/A;  . EYE SURGERY Right    cataract extraction  . PARTIAL HYSTERECTOMY  12/31/2016   Per patient, she has not had a period in years since she had a partial hysterectomy.  Marland Kitchen PORTA CATH  INSERTION    . TUBAL LIGATION      FAMILY HISTORY :   Family History  Problem Relation Age of Onset  . Ovarian cancer Mother 51  . Diabetes Mother   . Hypertension Mother   . COPD Father   . Hypertension Father   . Colon cancer Father 70  . Diabetes Sister   . Breast cancer Sister 46       bilateral  . Diabetes Brother   . Leukemia Maternal Aunt   . Pancreatic cancer Paternal Aunt 73  . Pancreatic cancer Paternal Uncle   . Colon cancer Paternal Uncle   . Stomach cancer Maternal Grandfather 12  . Throat cancer Paternal Grandmother   . Breast cancer Maternal Aunt 80  . Colon cancer Maternal Aunt   . Bone cancer Maternal Aunt   . Breast cancer Paternal Aunt        dx >50  . Prostate cancer Paternal Uncle   . Pancreatic cancer Paternal Uncle   . Throat cancer Paternal Uncle   . Lung cancer Paternal Uncle   . Stomach cancer Paternal Uncle   . Brain cancer Paternal Aunt   . Cancer Cousin  liver, kidney  . Prostate cancer Cousin        meastatic  . Lung cancer Other     SOCIAL HISTORY:   Social History   Tobacco Use  . Smoking status: Former Smoker    Packs/day: 0.50    Years: 1.00    Pack years: 0.50    Types: Cigarettes  . Smokeless tobacco: Never Used  Substance Use Topics  . Alcohol use: No    Alcohol/week: 0.0 standard drinks  . Drug use: No    ALLERGIES:  has No Known Allergies.  MEDICATIONS:  Current Outpatient Medications  Medication Sig Dispense Refill  . acetaminophen-codeine (TYLENOL #4) 300-60 MG tablet Take 1-2 tablets by mouth every 4 (four) hours as needed for moderate pain.     Marland Kitchen albuterol (PROAIR HFA) 108 (90 BASE) MCG/ACT inhaler Inhale 2 puffs into the lungs every 6 (six) hours as needed for wheezing or shortness of breath.     Marland Kitchen albuterol (PROVENTIL) (2.5 MG/3ML) 0.083% nebulizer solution Inhale 2.5 mg into the lungs every 6 (six) hours as needed for shortness of breath.     . ALPRAZolam (XANAX) 0.5 MG tablet Take 0.5 mg by mouth at  bedtime as needed for anxiety or sleep.     Marland Kitchen amLODipine (NORVASC) 10 MG tablet Take 10 mg by mouth daily.     Marland Kitchen aspirin EC 81 MG tablet Take 81 mg by mouth daily.     Marland Kitchen atenolol (TENORMIN) 100 MG tablet Take 100 mg by mouth 2 (two) times daily.     . B-D ULTRA-FINE 33 LANCETS MISC Use 1 each 2 (two) times daily.    . B-D ULTRAFINE III SHORT PEN 31G X 8 MM MISC     . bumetanide (BUMEX) 0.5 MG tablet Take 0.5 mg by mouth 2 (two) times daily.     . calcitRIOL (ROCALTROL) 0.25 MCG capsule Take 0.25 mcg by mouth every Monday, Wednesday, and Friday.     . Cinnamon 500 MG capsule Take 1,000 mg by mouth daily.     . cloNIDine (CATAPRES) 0.2 MG tablet Take 0.2 mg by mouth 2 (two) times daily.     . enalapril (VASOTEC) 10 MG tablet Take 20 mg by mouth 2 (two) times a day.     . famotidine (PEPCID) 20 MG tablet Take 20 mg by mouth 2 (two) times daily.    . ferrous sulfate 325 (65 FE) MG tablet Take 325 mg by mouth 2 (two) times daily with a meal.    . FLUoxetine (PROZAC) 20 MG capsule Take 20 mg by mouth 2 (two) times daily.     . Fluticasone Propionate, Inhal, (FLOVENT DISKUS) 100 MCG/BLIST AEPB Inhale 2 puffs into the lungs 2 (two) times a day. Inhale 2 inhalations into the lungs 2 times daily PRN     . gabapentin (NEURONTIN) 100 MG capsule TAKE 1 CAPSULE(100 MG) BY MOUTH AT BEDTIME (Patient taking differently: Take 100 mg by mouth at bedtime. ) 30 capsule 6  . glucose blood (ONE TOUCH ULTRA TEST) test strip Use 1 each 2 (two) times daily. Use as instructed.    . glyBURIDE (DIABETA) 5 MG tablet Take 10 mg by mouth 2 (two) times daily with a meal.     . LEVEMIR FLEXTOUCH 100 UNIT/ML Pen Inject 55 Units into the skin daily.     Marland Kitchen loperamide (IMODIUM A-D) 2 MG capsule Take 2-4 mg by mouth 4 (four) times daily as needed for diarrhea or loose stools.     Marland Kitchen  mometasone (NASONEX) 50 MCG/ACT nasal spray Place 2 sprays into the nose daily as needed (Allergies).     . NOVOLOG FLEXPEN 100 UNIT/ML FlexPen Inject 7  Units into the skin 2 (two) times daily.     . ondansetron (ZOFRAN) 8 MG tablet One pill every 8 hours as needed for nausea/vomitting. (Patient taking differently: Take 8 mg by mouth every 8 (eight) hours as needed for nausea or vomiting. ) 40 tablet 1  . prednisoLONE acetate (PRED FORTE) 1 % ophthalmic suspension Place 1 drop into the left eye 2 (two) times daily.    . prochlorperazine (COMPAZINE) 10 MG tablet Take 1 tablet (10 mg total) by mouth every 6 (six) hours as needed for nausea or vomiting. Please note change in strength (Patient taking differently: Take 10 mg by mouth every 6 (six) hours as needed for nausea or vomiting. ) 60 tablet 4  . salmeterol (SEREVENT) 50 MCG/DOSE diskus inhaler Inhale 1 puff into the lungs daily as needed (shortness of breath).     . simvastatin (ZOCOR) 20 MG tablet Take 20 mg by mouth daily at 6 PM.     . vitamin B-12 (CYANOCOBALAMIN) 1000 MCG tablet Take 1,000 mcg by mouth daily.     No current facility-administered medications for this visit.    Facility-Administered Medications Ordered in Other Visits  Medication Dose Route Frequency Provider Last Rate Last Dose  . sodium chloride flush (NS) 0.9 % injection 10 mL  10 mL Intravenous PRN Cammie Sickle, MD   10 mL at 01/30/16 1054    PHYSICAL EXAMINATION: ECOG PERFORMANCE STATUS: 1 - Symptomatic but completely ambulatory  BP (!) 172/98 (BP Location: Left Arm, Patient Position: Sitting, Cuff Size: Normal)   Pulse 75   Temp 98.1 F (36.7 C) (Tympanic)   Wt 217 lb (98.4 kg)   BMI 38.44 kg/m   Filed Weights   04/28/19 1522  Weight: 217 lb (98.4 kg)    Physical Exam  Constitutional: She is oriented to person, place, and time and well-developed, well-nourished, and in no distress.  She is alone.  HENT:  Head: Normocephalic and atraumatic.  Mouth/Throat: Oropharynx is clear and moist. No oropharyngeal exudate.  Eyes: Pupils are equal, round, and reactive to light.  Neck: Normal range of  motion. Neck supple.  Cardiovascular: Normal rate and regular rhythm.  Pulmonary/Chest: Breath sounds normal. No respiratory distress. She has no wheezes.  Abdominal: Soft. Bowel sounds are normal. She exhibits no distension and no mass. There is no abdominal tenderness. There is no rebound and no guarding.  Musculoskeletal: Normal range of motion.        General: Edema present. No tenderness.  Neurological: She is alert and oriented to person, place, and time.  Skin: Skin is warm.  Psychiatric: Affect normal.    LABORATORY DATA:  I have reviewed the data as listed    Component Value Date/Time   NA 137 04/29/2019 0801   NA 130 (L) 06/06/2014 1102   K 3.6 04/29/2019 0801   K 3.9 06/06/2014 1102   CL 103 04/29/2019 0801   CL 95 (L) 06/06/2014 1102   CO2 25 04/29/2019 0801   CO2 28 06/06/2014 1102   GLUCOSE 81 04/29/2019 0801   GLUCOSE 349 (H) 06/06/2014 1102   BUN 20 04/29/2019 0801   BUN 17 06/06/2014 1102   CREATININE 2.59 (H) 04/29/2019 0801   CREATININE 1.63 (H) 06/06/2014 1102   CALCIUM 8.6 (L) 04/29/2019 0801   CALCIUM 9.2 06/06/2014 1102  PROT 7.5 04/29/2019 0801   PROT 8.2 06/06/2014 1102   ALBUMIN 3.8 04/29/2019 0801   ALBUMIN 3.3 (L) 06/06/2014 1102   AST 15 04/29/2019 0801   AST 7 (L) 06/06/2014 1102   ALT 14 04/29/2019 0801   ALT 12 (L) 06/06/2014 1102   ALKPHOS 72 04/29/2019 0801   ALKPHOS 74 06/06/2014 1102   BILITOT 0.8 04/29/2019 0801   BILITOT 0.4 06/06/2014 1102   GFRNONAA 19 (L) 04/29/2019 0801   GFRNONAA 35 (L) 06/06/2014 1102   GFRAA 22 (L) 04/29/2019 0801   GFRAA 42 (L) 06/06/2014 1102    No results found for: SPEP, UPEP  Lab Results  Component Value Date   WBC 8.6 04/29/2019   NEUTROABS 5.6 04/29/2019   HGB 10.2 (L) 04/29/2019   HCT 30.2 (L) 04/29/2019   MCV 91.5 04/29/2019   PLT 335 04/29/2019      Chemistry      Component Value Date/Time   NA 137 04/29/2019 0801   NA 130 (L) 06/06/2014 1102   K 3.6 04/29/2019 0801   K 3.9  06/06/2014 1102   CL 103 04/29/2019 0801   CL 95 (L) 06/06/2014 1102   CO2 25 04/29/2019 0801   CO2 28 06/06/2014 1102   BUN 20 04/29/2019 0801   BUN 17 06/06/2014 1102   CREATININE 2.59 (H) 04/29/2019 0801   CREATININE 1.63 (H) 06/06/2014 1102      Component Value Date/Time   CALCIUM 8.6 (L) 04/29/2019 0801   CALCIUM 9.2 06/06/2014 1102   ALKPHOS 72 04/29/2019 0801   ALKPHOS 74 06/06/2014 1102   AST 15 04/29/2019 0801   AST 7 (L) 06/06/2014 1102   ALT 14 04/29/2019 0801   ALT 12 (L) 06/06/2014 1102   BILITOT 0.8 04/29/2019 0801   BILITOT 0.4 06/06/2014 1102       RADIOGRAPHIC STUDIES: I have personally reviewed the radiological images as listed and agreed with the findings in the report. No results found.   ASSESSMENT & PLAN:  Carcinoma of upper-inner quadrant of left breast in female, estrogen receptor positive ([place]) Left breast cancer- stage IV- ER/PR +, HER-2/neu.  PET scan April 12, 2019-mild progressive disease ; increased uptake pleural-based lesion/lung lesions; intense uptake right acetabular region otherwise stable bone lesions/left breast mass; tumor marker rising.  Proceed with eribulin.  # proceed with eribulin-renally dosed [start at 1 mg/m].Labs today reviewed;  acceptable for treatment today.  Discussed the potential side effects include neutropenia/low blood counts; neuropathy.  # Bone mets-sclerotic; Right acetabular uptake- but asymptomatic. 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   # DISPOSITION:  # eribulin Treatment today  # eribulin- 1 week- cbc/bmp  # follow up in 3 weeks labs/MD-cbc/cmp-ca-27-29; Eribulin - Dr.B   Orders Placed This Encounter  Procedures  . CBC  with Differential/Platelet    Standing Status:   Standing    Number of Occurrences:   10    Standing Expiration Date:   04/28/2020  . Comprehensive metabolic panel    Standing Status:   Standing    Number of Occurrences:   10    Standing Expiration Date:   04/28/2020  . Cancer antigen 27.29    Standing Status:   Standing    Number of Occurrences:   10  Standing Expiration Date:   04/28/2020   All questions were answered. The patient knows to call the clinic with any problems, questions or concerns.      Cammie Sickle, MD 04/29/2019 1:19 PM

## 2019-04-30 LAB — CANCER ANTIGEN 27.29: CA 27.29: 96.9 U/mL — ABNORMAL HIGH (ref 0.0–38.6)

## 2019-05-06 ENCOUNTER — Inpatient Hospital Stay: Payer: Medicare HMO | Attending: Internal Medicine

## 2019-05-06 ENCOUNTER — Inpatient Hospital Stay (HOSPITAL_BASED_OUTPATIENT_CLINIC_OR_DEPARTMENT_OTHER): Payer: Medicare HMO | Admitting: Oncology

## 2019-05-06 ENCOUNTER — Inpatient Hospital Stay: Payer: Medicare HMO

## 2019-05-06 ENCOUNTER — Other Ambulatory Visit: Payer: Self-pay

## 2019-05-06 DIAGNOSIS — L02511 Cutaneous abscess of right hand: Secondary | ICD-10-CM | POA: Diagnosis not present

## 2019-05-06 DIAGNOSIS — Z90710 Acquired absence of both cervix and uterus: Secondary | ICD-10-CM | POA: Diagnosis not present

## 2019-05-06 DIAGNOSIS — Z803 Family history of malignant neoplasm of breast: Secondary | ICD-10-CM | POA: Diagnosis not present

## 2019-05-06 DIAGNOSIS — F329 Major depressive disorder, single episode, unspecified: Secondary | ICD-10-CM | POA: Insufficient documentation

## 2019-05-06 DIAGNOSIS — Z87891 Personal history of nicotine dependence: Secondary | ICD-10-CM | POA: Insufficient documentation

## 2019-05-06 DIAGNOSIS — E119 Type 2 diabetes mellitus without complications: Secondary | ICD-10-CM | POA: Insufficient documentation

## 2019-05-06 DIAGNOSIS — Z8041 Family history of malignant neoplasm of ovary: Secondary | ICD-10-CM | POA: Diagnosis not present

## 2019-05-06 DIAGNOSIS — C7951 Secondary malignant neoplasm of bone: Secondary | ICD-10-CM | POA: Diagnosis not present

## 2019-05-06 DIAGNOSIS — Z794 Long term (current) use of insulin: Secondary | ICD-10-CM | POA: Diagnosis not present

## 2019-05-06 DIAGNOSIS — Z170 Estrogen receptor positive status [ER+]: Secondary | ICD-10-CM

## 2019-05-06 DIAGNOSIS — I10 Essential (primary) hypertension: Secondary | ICD-10-CM | POA: Insufficient documentation

## 2019-05-06 DIAGNOSIS — Z8042 Family history of malignant neoplasm of prostate: Secondary | ICD-10-CM | POA: Insufficient documentation

## 2019-05-06 DIAGNOSIS — C50212 Malignant neoplasm of upper-inner quadrant of left female breast: Secondary | ICD-10-CM | POA: Diagnosis not present

## 2019-05-06 DIAGNOSIS — Z7951 Long term (current) use of inhaled steroids: Secondary | ICD-10-CM | POA: Diagnosis not present

## 2019-05-06 DIAGNOSIS — L089 Local infection of the skin and subcutaneous tissue, unspecified: Secondary | ICD-10-CM | POA: Diagnosis not present

## 2019-05-06 DIAGNOSIS — Z5111 Encounter for antineoplastic chemotherapy: Secondary | ICD-10-CM | POA: Diagnosis not present

## 2019-05-06 DIAGNOSIS — Z79899 Other long term (current) drug therapy: Secondary | ICD-10-CM | POA: Insufficient documentation

## 2019-05-06 DIAGNOSIS — L03011 Cellulitis of right finger: Secondary | ICD-10-CM | POA: Diagnosis not present

## 2019-05-06 DIAGNOSIS — Z7982 Long term (current) use of aspirin: Secondary | ICD-10-CM | POA: Insufficient documentation

## 2019-05-06 DIAGNOSIS — D6481 Anemia due to antineoplastic chemotherapy: Secondary | ICD-10-CM | POA: Insufficient documentation

## 2019-05-06 DIAGNOSIS — J45909 Unspecified asthma, uncomplicated: Secondary | ICD-10-CM | POA: Insufficient documentation

## 2019-05-06 DIAGNOSIS — F419 Anxiety disorder, unspecified: Secondary | ICD-10-CM | POA: Insufficient documentation

## 2019-05-06 DIAGNOSIS — N184 Chronic kidney disease, stage 4 (severe): Secondary | ICD-10-CM | POA: Insufficient documentation

## 2019-05-06 DIAGNOSIS — Z95828 Presence of other vascular implants and grafts: Secondary | ICD-10-CM

## 2019-05-06 DIAGNOSIS — Z800 Family history of malignant neoplasm of digestive organs: Secondary | ICD-10-CM | POA: Insufficient documentation

## 2019-05-06 LAB — CBC WITH DIFFERENTIAL/PLATELET
Abs Immature Granulocytes: 0.17 10*3/uL — ABNORMAL HIGH (ref 0.00–0.07)
Basophils Absolute: 0.1 10*3/uL (ref 0.0–0.1)
Basophils Relative: 1 %
Eosinophils Absolute: 0.1 10*3/uL (ref 0.0–0.5)
Eosinophils Relative: 1 %
HCT: 27.1 % — ABNORMAL LOW (ref 36.0–46.0)
Hemoglobin: 9.3 g/dL — ABNORMAL LOW (ref 12.0–15.0)
Immature Granulocytes: 2 %
Lymphocytes Relative: 10 %
Lymphs Abs: 1 10*3/uL (ref 0.7–4.0)
MCH: 31 pg (ref 26.0–34.0)
MCHC: 34.3 g/dL (ref 30.0–36.0)
MCV: 90.3 fL (ref 80.0–100.0)
Monocytes Absolute: 0.7 10*3/uL (ref 0.1–1.0)
Monocytes Relative: 6 %
Neutro Abs: 8.8 10*3/uL — ABNORMAL HIGH (ref 1.7–7.7)
Neutrophils Relative %: 80 %
Platelets: 249 10*3/uL (ref 150–400)
RBC: 3 MIL/uL — ABNORMAL LOW (ref 3.87–5.11)
RDW: 12.3 % (ref 11.5–15.5)
WBC: 10.8 10*3/uL — ABNORMAL HIGH (ref 4.0–10.5)
nRBC: 0 % (ref 0.0–0.2)

## 2019-05-06 LAB — COMPREHENSIVE METABOLIC PANEL
ALT: 17 U/L (ref 0–44)
AST: 16 U/L (ref 15–41)
Albumin: 3.6 g/dL (ref 3.5–5.0)
Alkaline Phosphatase: 65 U/L (ref 38–126)
Anion gap: 8 (ref 5–15)
BUN: 22 mg/dL (ref 8–23)
CO2: 24 mmol/L (ref 22–32)
Calcium: 8.5 mg/dL — ABNORMAL LOW (ref 8.9–10.3)
Chloride: 102 mmol/L (ref 98–111)
Creatinine, Ser: 2.89 mg/dL — ABNORMAL HIGH (ref 0.44–1.00)
GFR calc Af Amer: 19 mL/min — ABNORMAL LOW (ref >60)
GFR calc non Af Amer: 17 mL/min — ABNORMAL LOW (ref >60)
Glucose, Bld: 199 mg/dL — ABNORMAL HIGH (ref 70–99)
Potassium: 3.3 mmol/L — ABNORMAL LOW (ref 3.5–5.1)
Sodium: 134 mmol/L — ABNORMAL LOW (ref 135–145)
Total Bilirubin: 0.6 mg/dL (ref 0.3–1.2)
Total Protein: 7 g/dL (ref 6.5–8.1)

## 2019-05-06 MED ORDER — HEPARIN SOD (PORK) LOCK FLUSH 100 UNIT/ML IV SOLN
500.0000 [IU] | Freq: Once | INTRAVENOUS | Status: AC
  Administered 2019-05-06: 500 [IU] via INTRAVENOUS
  Filled 2019-05-06: qty 5

## 2019-05-06 MED ORDER — SODIUM CHLORIDE 0.9% FLUSH
10.0000 mL | Freq: Once | INTRAVENOUS | Status: AC
  Administered 2019-05-06: 10 mL via INTRAVENOUS
  Filled 2019-05-06: qty 10

## 2019-05-06 MED ORDER — AMOXICILLIN-POT CLAVULANATE 875-125 MG PO TABS: 1.0000 | ORAL_TABLET | Freq: Two times a day (BID) | ORAL | 0 refills | Status: DC

## 2019-05-07 LAB — CANCER ANTIGEN 27.29: CA 27.29: 88.8 U/mL — ABNORMAL HIGH (ref 0.0–38.6)

## 2019-05-10 DIAGNOSIS — L02511 Cutaneous abscess of right hand: Secondary | ICD-10-CM | POA: Diagnosis not present

## 2019-05-10 DIAGNOSIS — I130 Hypertensive heart and chronic kidney disease with heart failure and stage 1 through stage 4 chronic kidney disease, or unspecified chronic kidney disease: Secondary | ICD-10-CM | POA: Diagnosis not present

## 2019-05-10 DIAGNOSIS — J45909 Unspecified asthma, uncomplicated: Secondary | ICD-10-CM | POA: Diagnosis not present

## 2019-05-10 DIAGNOSIS — E1122 Type 2 diabetes mellitus with diabetic chronic kidney disease: Secondary | ICD-10-CM | POA: Diagnosis not present

## 2019-05-10 DIAGNOSIS — I509 Heart failure, unspecified: Secondary | ICD-10-CM | POA: Diagnosis not present

## 2019-05-10 DIAGNOSIS — Z01818 Encounter for other preprocedural examination: Secondary | ICD-10-CM | POA: Diagnosis not present

## 2019-05-10 DIAGNOSIS — N189 Chronic kidney disease, unspecified: Secondary | ICD-10-CM | POA: Diagnosis not present

## 2019-05-10 DIAGNOSIS — I69954 Hemiplegia and hemiparesis following unspecified cerebrovascular disease affecting left non-dominant side: Secondary | ICD-10-CM | POA: Diagnosis not present

## 2019-05-10 DIAGNOSIS — G8918 Other acute postprocedural pain: Secondary | ICD-10-CM | POA: Diagnosis not present

## 2019-05-10 DIAGNOSIS — I340 Nonrheumatic mitral (valve) insufficiency: Secondary | ICD-10-CM | POA: Diagnosis not present

## 2019-05-10 DIAGNOSIS — M65841 Other synovitis and tenosynovitis, right hand: Secondary | ICD-10-CM | POA: Diagnosis not present

## 2019-05-10 DIAGNOSIS — L03011 Cellulitis of right finger: Secondary | ICD-10-CM | POA: Diagnosis not present

## 2019-05-13 ENCOUNTER — Ambulatory Visit: Payer: Medicare HMO

## 2019-05-13 ENCOUNTER — Telehealth: Payer: Self-pay | Admitting: Internal Medicine

## 2019-05-13 ENCOUNTER — Inpatient Hospital Stay: Payer: Medicare HMO | Admitting: Internal Medicine

## 2019-05-13 ENCOUNTER — Inpatient Hospital Stay: Payer: Medicare HMO

## 2019-05-13 ENCOUNTER — Other Ambulatory Visit: Payer: Medicare HMO

## 2019-05-13 NOTE — Assessment & Plan Note (Deleted)
Left breast cancer- stage IV- ER/PR +, HER-2/neu.  PET scan April 12, 2019-mild progressive disease ; increased uptake pleural-based lesion/lung lesions; intense uptake right acetabular region otherwise stable bone lesions/left breast mass; tumor marker rising.  Proceed with eribulin.  # proceed with eribulin-renally dosed [start at 1 mg/m].Labs today reviewed;  acceptable for treatment today.  Discussed the potential side effects include neutropenia/low blood counts; neuropathy.  # Bone mets-sclerotic; Right acetabular uptake- but asymptomatic. 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   # DISPOSITION:  # eribulin Treatment today  # eribulin- 1 week- cbc/bmp  # follow up in 3 weeks labs/MD-cbc/cmp-ca-27-29; Eribulin - Dr.B

## 2019-05-13 NOTE — Telephone Encounter (Signed)
Spoke to patient regarding her infected/abscess of the thumb.  Status post incision and drainage.  Awaiting repeat evaluation in [place]. Recommend follow-up as planned next week

## 2019-05-13 NOTE — Progress Notes (Deleted)
[facility] OFFICE PROGRESS NOTE  Patient Care Team: [name], [name], MD as Medical Oncologist (Medical Oncology) Corey Skains, MD as Consulting Physician (Cardiology)  Cancer Staging No matching staging information was found for the patient.   Oncology History Overview Note  # OCT 2015-STAGE IV LEFT BREAST T2N1 [T=4cm; [name]; [name]; [name]; EBUS- Positive Paratrac/subcarinal LN s/p ? Taxotere [in [place]; [name]; [name];[name]; [name]; suv 3]; [name]; [name]; [name];  [name]; Ant R iliac bone; [name]; [name]; START-Taxol weekly. March 2020- Taxol every 2 weeks [PN]  # Poorly controlled Blood sugars- improved.   # Pancreatitis Hx/PEI- on creon in past / CKD IV [creat ~ 3-4; [name]; [name]; mild left sided weakness]  # Jan 2020-  Lobular lesion on tongue- s/p excision pyogenic granuloma [Dr.McQueen]   # GENETIC TESTING/COUNSELLING: HETEROZYGOUS Cystic Fibrosis Gene [explains hx of recurrent pancreatitis]  # MOLECULAR TESTING: NA  ------------------------------------------------   DIAGNOSIS: [ 2683] BREAST CA; [name]; [name] ;GOALS: Palliative  CURRENT/MOST RECENT THERAPY [ April 2019] TAXOL    Carcinoma of upper-inner quadrant of left breast in female, estrogen receptor positive ([place])  04/29/2019 -  Chemotherapy   The patient had eriBULin mesylate ([place]) 2 mg in sodium  chloride 0.9 % 100 mL chemo infusion, 2 mg, Intravenous,  Once, 1 of 4 cycles Dose modification: 1 mg/m2 (original dose 1 mg/m2, Cycle 1, Reason: Provider Judgment) Administration: 2 mg (04/29/2019)  for chemotherapy treatment.     INTERVAL HISTORY:  [name] 63 y.o.  female pleasant patient above history of metastatic ER PR positive HER-2 negative breast cancer-noted to have progressive disease on Taxol on a PET scan.  Patient is here to proceed with eribulin.  The interim patient was evaluated by nephrology.  Appetite is good.  No nausea no vomiting.  Denies any worsening hip pain.  No back pain.  No significant nausea vomiting.  Abdominal pain improved.  She is currently on omeprazole.  Chronic swelling in the legs.  Review of Systems  Constitutional: Positive for malaise/fatigue. Negative for chills, diaphoresis, [name], hemoptysis, sputum production, shortness of breath and wheezing.   Cardiovascular: Positive for leg swelling. Negative for chest pain, palpitations and orthopnea.  Gastrointestinal: Negative for abdominal pain, blood in stool, constipation, diarrhea, heartburn, melena, nausea and vomiting.  Genitourinary: Negative for dysuria, frequency and urgency.  Musculoskeletal: Negative for back pain and joint pain.  Skin: Negative.  Negative for itching and rash.  Neurological: Positive for tingling. Negative for dizziness, focal weakness, weakness and headaches.  Endo/Heme/Allergies: Does not bruise/bleed easily.  Psychiatric/Behavioral: Negative for depression. The patient is not nervous/anxious and does not have insomnia.     PAST MEDICAL HISTORY :  Past Medical History:  Diagnosis Date  . Anemia   . Anxiety   . Asthma   .  Cancer ([place]) 03/10/2018   Per [facility] PET order. Carcinoma of upper-inner quadrant of left breast in female,  estrogen receptor positive .  Marland Kitchen Cancer (HCC)    LUNG  . CHF (congestive heart failure) ([place]) 1997  . CKD (chronic kidney disease)   . Depression   . Diabetes mellitus, type 2 ([place])   . Family history of breast cancer   . Family history of colon cancer   . Family history of ovarian cancer   . Family history of pancreatic cancer   . Family history of prostate cancer   . Family history of stomach cancer   . GERD (gastroesophageal reflux disease)    history of an ulcer  . Hair loss   . History of left breast cancer 05/29/14  . History of partial hysterectomy 12/31/2016   Per patient.  Has not had a period in years.  Had a partial hysterectomy years ago.  Marland Kitchen Hypertension   . Mitral valve regurgitation   . Neuromuscular disorder (HCC)    neuropathies in hand  . Obesity   . Pancreatitis 1997  . Stroke [facility]) 2010   with mild left arm weakness    PAST SURGICAL HISTORY :   Past Surgical History:  Procedure Laterality Date  . CATARACT EXTRACTION W/PHACO Right 02/24/2019   Procedure: CATARACT EXTRACTION PHACO AND INTRAOCULAR LENS PLACEMENT ([place]) RIGHT DIABETES;  Surgeon: Marchia Meiers, MD;  Location: [facility] ORS;  Service: Ophthalmology;  Laterality: Right;  [place] 01:13.0 CDE 7.96 Fluid Pack Lot # [id number]  . CATARACT EXTRACTION W/PHACO Left 03/24/2019   Procedure: CATARACT EXTRACTION PHACO AND INTRAOCULAR LENS PLACEMENT ([facility]) - left diabetic;  Surgeon: Marchia Meiers, MD;  Location: [facility] ORS;  Service: Ophthalmology;  Laterality: Left;  [place]  01:36 CDE 13.93 Fluid pack lot # [id number]  . CESAREAN SECTION    . CHOLECYSTECTOMY    . EXCISION OF TONGUE LESION N/A 08/17/2018   Procedure: EXCISION OF TONGUE LESION WITH FROZEN SECTIONS;  Surgeon: Beverly Gust, MD;  Location: [facility] ORS;  Service: ENT;  Laterality: N/A;  . EYE SURGERY Right    cataract extraction  . PARTIAL HYSTERECTOMY  12/31/2016   Per patient, she has not had a period in years since she had a partial hysterectomy.  Marland Kitchen PORTA CATH  INSERTION    . TUBAL LIGATION      FAMILY HISTORY :   Family History  Problem Relation Age of Onset  . Ovarian cancer Mother 63  . Diabetes Mother   . Hypertension Mother   . COPD Father   . Hypertension Father   . Colon cancer Father 7  . Diabetes Sister   . Breast cancer Sister 65       bilateral  . Diabetes Brother   . Leukemia Maternal Aunt   . Pancreatic cancer Paternal Aunt 38  . Pancreatic cancer Paternal Uncle   . Colon cancer Paternal Uncle   . Stomach cancer Maternal Grandfather 2  . Throat cancer Paternal Grandmother   . Breast cancer Maternal Aunt 80  . Colon cancer Maternal Aunt   . Bone cancer Maternal Aunt   . Breast cancer Paternal Aunt        dx >50  . Prostate cancer Paternal Uncle   . Pancreatic cancer Paternal Uncle   . Throat cancer Paternal Uncle   . Lung cancer Paternal Uncle   . Stomach cancer Paternal Uncle   . Brain cancer Paternal Aunt   . Cancer Cousin  liver, kidney  . Prostate cancer Cousin        meastatic  . Lung cancer Other     SOCIAL HISTORY:   Social History   Tobacco Use  . Smoking status: Former Smoker    Packs/day: 0.50    Years: 1.00    Pack years: 0.50    Types: Cigarettes  . Smokeless tobacco: Never Used  Substance Use Topics  . Alcohol use: No    Alcohol/week: 0.0 standard drinks  . Drug use: No    ALLERGIES:  has No Known Allergies.  MEDICATIONS:  Current Outpatient Medications  Medication Sig Dispense Refill  . acetaminophen-codeine (TYLENOL #4) 300-60 MG tablet Take 1-2 tablets by mouth every 4 (four) hours as needed for moderate pain.     Marland Kitchen albuterol (PROAIR HFA) 108 (90 BASE) MCG/ACT inhaler Inhale 2 puffs into the lungs every 6 (six) hours as needed for wheezing or shortness of breath.     Marland Kitchen albuterol (PROVENTIL) (2.5 MG/3ML) 0.083% nebulizer solution Inhale 2.5 mg into the lungs every 6 (six) hours as needed for shortness of breath.     . ALPRAZolam (XANAX) 0.5 MG tablet Take 0.5 mg by mouth at  bedtime as needed for anxiety or sleep.     Marland Kitchen amLODipine (NORVASC) 10 MG tablet Take 10 mg by mouth daily.     Marland Kitchen amoxicillin-clavulanate (AUGMENTIN) 875-125 MG tablet Take 1 tablet by mouth 2 (two) times daily. 20 tablet 0  . aspirin EC 81 MG tablet Take 81 mg by mouth daily.     Marland Kitchen atenolol (TENORMIN) 100 MG tablet Take 100 mg by mouth 2 (two) times daily.     . B-D ULTRA-FINE 33 LANCETS MISC Use 1 each 2 (two) times daily.    . B-D ULTRAFINE III SHORT PEN 31G X 8 MM MISC     . bumetanide (BUMEX) 0.5 MG tablet Take 0.5 mg by mouth 2 (two) times daily.     . calcitRIOL (ROCALTROL) 0.25 MCG capsule Take 0.25 mcg by mouth every Monday, Wednesday, and Friday.     . Cinnamon 500 MG capsule Take 1,000 mg by mouth daily.     . cloNIDine (CATAPRES) 0.2 MG tablet Take 0.2 mg by mouth 2 (two) times daily.     . enalapril (VASOTEC) 10 MG tablet Take 20 mg by mouth 2 (two) times a day.     . famotidine (PEPCID) 20 MG tablet Take 20 mg by mouth 2 (two) times daily.    . ferrous sulfate 325 (65 FE) MG tablet Take 325 mg by mouth 2 (two) times daily with a meal.    . FLUoxetine (PROZAC) 20 MG capsule Take 20 mg by mouth 2 (two) times daily.     . Fluticasone Propionate, Inhal, (FLOVENT DISKUS) 100 MCG/BLIST AEPB Inhale 2 puffs into the lungs 2 (two) times a day. Inhale 2 inhalations into the lungs 2 times daily PRN     . gabapentin (NEURONTIN) 100 MG capsule TAKE 1 CAPSULE(100 MG) BY MOUTH AT BEDTIME (Patient taking differently: Take 100 mg by mouth at bedtime. ) 30 capsule 6  . glucose blood (ONE TOUCH ULTRA TEST) test strip Use 1 each 2 (two) times daily. Use as instructed.    . glyBURIDE (DIABETA) 5 MG tablet Take 10 mg by mouth 2 (two) times daily with a meal.     . LEVEMIR FLEXTOUCH 100 UNIT/ML Pen Inject 55 Units into the skin daily.     Marland Kitchen loperamide (IMODIUM A-D) 2  MG capsule Take 2-4 mg by mouth 4 (four) times daily as needed for diarrhea or loose stools.     . mometasone (NASONEX) 50 MCG/ACT nasal  spray Place 2 sprays into the nose daily as needed (Allergies).     . NOVOLOG FLEXPEN 100 UNIT/ML FlexPen Inject 7 Units into the skin 2 (two) times daily.     . ondansetron (ZOFRAN) 8 MG tablet One pill every 8 hours as needed for nausea/vomitting. (Patient taking differently: Take 8 mg by mouth every 8 (eight) hours as needed for nausea or vomiting. ) 40 tablet 1  . prednisoLONE acetate (PRED FORTE) 1 % ophthalmic suspension Place 1 drop into the left eye 2 (two) times daily.    . prochlorperazine (COMPAZINE) 10 MG tablet Take 1 tablet (10 mg total) by mouth every 6 (six) hours as needed for nausea or vomiting. Please note change in strength (Patient taking differently: Take 10 mg by mouth every 6 (six) hours as needed for nausea or vomiting. ) 60 tablet 4  . salmeterol (SEREVENT) 50 MCG/DOSE diskus inhaler Inhale 1 puff into the lungs daily as needed (shortness of breath).     . simvastatin (ZOCOR) 20 MG tablet Take 20 mg by mouth daily at 6 PM.     . vitamin B-12 (CYANOCOBALAMIN) 1000 MCG tablet Take 1,000 mcg by mouth daily.     No current facility-administered medications for this visit.    Facility-Administered Medications Ordered in Other Visits  Medication Dose Route Frequency Provider Last Rate Last Dose  . sodium chloride flush (NS) 0.9 % injection 10 mL  10 mL Intravenous PRN Cammie Sickle, MD   10 mL at 01/30/16 1054    PHYSICAL EXAMINATION: ECOG PERFORMANCE STATUS: 1 - Symptomatic but completely ambulatory  There were no vitals taken for this visit.  There were no vitals filed for this visit.  Physical Exam  Constitutional: She is oriented to person, place, and time and well-developed, well-nourished, and in no distress.  She is alone.  HENT:  Head: Normocephalic and atraumatic.  Mouth/Throat: Oropharynx is clear and moist. No oropharyngeal exudate.  Eyes: Pupils are equal, round, and reactive to light.  Neck: Normal range of motion. Neck supple.  Cardiovascular:  Normal rate and regular rhythm.  Pulmonary/Chest: Breath sounds normal. No respiratory distress. She has no wheezes.  Abdominal: Soft. Bowel sounds are normal. She exhibits no distension and no mass. There is no abdominal tenderness. There is no rebound and no guarding.  Musculoskeletal: Normal range of motion.        General: Edema present. No tenderness.  Neurological: She is alert and oriented to person, place, and time.  Skin: Skin is warm.  Psychiatric: Affect normal.    LABORATORY DATA:  I have reviewed the data as listed    Component Value Date/Time   NA 134 (L) 05/06/2019 0833   NA 130 (L) 06/06/2014 1102   K 3.3 (L) 05/06/2019 0833   K 3.9 06/06/2014 1102   CL 102 05/06/2019 0833   CL 95 (L) 06/06/2014 1102   CO2 24 05/06/2019 0833   CO2 28 06/06/2014 1102   GLUCOSE 199 (H) 05/06/2019 0833   GLUCOSE 349 (H) 06/06/2014 1102   BUN 22 05/06/2019 0833   BUN 17 06/06/2014 1102   CREATININE 2.89 (H) 05/06/2019 0833   CREATININE 1.63 (H) 06/06/2014 1102   CALCIUM 8.5 (L) 05/06/2019 0833   CALCIUM 9.2 06/06/2014 1102   PROT 7.0 05/06/2019 0833   PROT 8.2 06/06/2014  1102   ALBUMIN 3.6 05/06/2019 0833   ALBUMIN 3.3 (L) 06/06/2014 1102   AST 16 05/06/2019 0833   AST 7 (L) 06/06/2014 1102   ALT 17 05/06/2019 0833   ALT 12 (L) 06/06/2014 1102   ALKPHOS 65 05/06/2019 0833   ALKPHOS 74 06/06/2014 1102   BILITOT 0.6 05/06/2019 0833   BILITOT 0.4 06/06/2014 1102   GFRNONAA 17 (L) 05/06/2019 0833   GFRNONAA 35 (L) 06/06/2014 1102   GFRAA 19 (L) 05/06/2019 0833   GFRAA 42 (L) 06/06/2014 1102    No results found for: SPEP, UPEP  Lab Results  Component Value Date   WBC 10.8 (H) 05/06/2019   NEUTROABS 8.8 (H) 05/06/2019   HGB 9.3 (L) 05/06/2019   HCT 27.1 (L) 05/06/2019   MCV 90.3 05/06/2019   PLT 249 05/06/2019      Chemistry      Component Value Date/Time   NA 134 (L) 05/06/2019 0833   NA 130 (L) 06/06/2014 1102   K 3.3 (L) 05/06/2019 0833   K 3.9 06/06/2014  1102   CL 102 05/06/2019 0833   CL 95 (L) 06/06/2014 1102   CO2 24 05/06/2019 0833   CO2 28 06/06/2014 1102   BUN 22 05/06/2019 0833   BUN 17 06/06/2014 1102   CREATININE 2.89 (H) 05/06/2019 0833   CREATININE 1.63 (H) 06/06/2014 1102      Component Value Date/Time   CALCIUM 8.5 (L) 05/06/2019 0833   CALCIUM 9.2 06/06/2014 1102   ALKPHOS 65 05/06/2019 0833   ALKPHOS 74 06/06/2014 1102   AST 16 05/06/2019 0833   AST 7 (L) 06/06/2014 1102   ALT 17 05/06/2019 0833   ALT 12 (L) 06/06/2014 1102   BILITOT 0.6 05/06/2019 0833   BILITOT 0.4 06/06/2014 1102       RADIOGRAPHIC STUDIES: I have personally reviewed the radiological images as listed and agreed with the findings in the report. No results found.   ASSESSMENT & PLAN:  No problem-specific Assessment & Plan notes found for this encounter.   No orders of the defined types were placed in this encounter.  All questions were answered. The patient knows to call the clinic with any problems, questions or concerns.      Cammie Sickle, MD 05/13/2019 7:32 AM

## 2019-05-17 DIAGNOSIS — C50412 Malignant neoplasm of upper-outer quadrant of left female breast: Secondary | ICD-10-CM | POA: Diagnosis not present

## 2019-05-17 DIAGNOSIS — L03011 Cellulitis of right finger: Secondary | ICD-10-CM | POA: Diagnosis not present

## 2019-05-17 DIAGNOSIS — E119 Type 2 diabetes mellitus without complications: Secondary | ICD-10-CM | POA: Diagnosis not present

## 2019-05-17 DIAGNOSIS — E1122 Type 2 diabetes mellitus with diabetic chronic kidney disease: Secondary | ICD-10-CM | POA: Diagnosis not present

## 2019-05-17 DIAGNOSIS — Z794 Long term (current) use of insulin: Secondary | ICD-10-CM | POA: Diagnosis not present

## 2019-05-17 DIAGNOSIS — I10 Essential (primary) hypertension: Secondary | ICD-10-CM | POA: Diagnosis not present

## 2019-05-17 DIAGNOSIS — N184 Chronic kidney disease, stage 4 (severe): Secondary | ICD-10-CM | POA: Diagnosis not present

## 2019-05-17 DIAGNOSIS — R8290 Unspecified abnormal findings in urine: Secondary | ICD-10-CM | POA: Diagnosis not present

## 2019-05-17 DIAGNOSIS — I5022 Chronic systolic (congestive) heart failure: Secondary | ICD-10-CM | POA: Diagnosis not present

## 2019-05-17 DIAGNOSIS — I340 Nonrheumatic mitral (valve) insufficiency: Secondary | ICD-10-CM | POA: Diagnosis not present

## 2019-05-17 DIAGNOSIS — F3340 Major depressive disorder, recurrent, in remission, unspecified: Secondary | ICD-10-CM | POA: Diagnosis not present

## 2019-05-17 DIAGNOSIS — J4520 Mild intermittent asthma, uncomplicated: Secondary | ICD-10-CM | POA: Diagnosis not present

## 2019-05-17 NOTE — Progress Notes (Signed)
Symptom Management Consult note [facility]  Telephone:([phone] [phone]  Patient Care Team: [name], [name], MD as Medical Oncologist (Medical Oncology) Corey Skains, MD as Consulting Physician (Cardiology)   Name of the patient: [name]  [id number]  08/05/1955   Date of visit: 05/06/2019   Diagnosis-left breast cancer  Chief complaint/ Reason for visit-swollen finger  Heme/Onc history:  Oncology History Overview Note  # OCT 2015-STAGE IV LEFT BREAST T2N1 [T=4cm; [name]; [name]; [name]; EBUS- Positive Paratrac/subcarinal LN s/p ? Taxotere [in [place]; [name]; [name];[name]; [name]; suv 3]; [name]; [name]; [name];  [name]; Ant R iliac bone; [name]; [name]; START-Taxol weekly. March 2020- Taxol every 2 weeks [PN]; SEP 2020- PET progression  # SEP 9/25/200- ERIBULIN   # Poorly controlled Blood sugars- improved.   # Pancreatitis Hx/PEI- on creon in past / CKD IV [creat ~ 3-4; [name]; [name]; mild left sided weakness]  # Jan 2020-  Lobular lesion on tongue- s/p excision pyogenic granuloma [Dr.McQueen]   # GENETIC TESTING/COUNSELLING: HETEROZYGOUS Cystic Fibrosis Gene [explains hx of recurrent pancreatitis]  # MOLECULAR TESTING: NA  ------------------------------------------------   DIAGNOSIS: [ 4765] BREAST CA; [name]; [name] ;GOALS:  Palliative  CURRENT/MOST RECENT THERAPY: ERIBULIN.     Carcinoma of upper-inner quadrant of left breast in female, estrogen receptor positive ([place])  04/29/2019 -  Chemotherapy   The patient had eriBULin mesylate (HALAVEN) 2 mg in sodium chloride 0.9 % 100 mL chemo infusion, 2 mg, Intravenous,  Once, 1 of 4 cycles Dose modification: 1 mg/m2 (original dose 1 mg/m2, Cycle 1, Reason: Provider Judgment) Administration: 2 mg (04/29/2019)  for chemotherapy treatment.     Interval history-patient presents to symptom management today for complaints of acute Right thumb injury. States that a few days ago one of her grandchildren accidentally shut her thumb in the door.  She removed her acrylic nail because it was broken and has applied topical ointments and ice.  She has taken Tylenol for the pain with relief until this morning.  States today, she has noticed yellow/creamy pus coming from around the nailbed and swelling with increased pain.  She would like to be evaluated.  She denies any fevers, chest pain, nausea, vomiting, constipation or diarrhea.  ECOG FS:1 - Symptomatic but completely ambulatory  Review of systems- Review of Systems  Constitutional: Positive for malaise/fatigue.  Musculoskeletal:       Right thumb pain    Current treatmen t- S/p 1 cycle single agent Taxol.   No Known Allergies   Past Medical History:  Diagnosis Date  . Anemia   . Anxiety   . Asthma   . Cancer ([place]) 03/10/2018   Per [facility] PET order. Carcinoma of upper-inner quadrant of left breast in female, estrogen receptor positive .  Marland Kitchen Cancer (HCC)    LUNG  . CHF (congestive heart failure) ([place]) 1997  . CKD (chronic kidney disease)   . Depression   .  Diabetes mellitus, type 2 ([place])   . Family history of breast cancer   . Family history of colon cancer   . Family history of ovarian cancer   . Family history of pancreatic cancer   . Family history of prostate cancer   . Family history of stomach cancer   . GERD  (gastroesophageal reflux disease)    history of an ulcer  . Hair loss   . History of left breast cancer 05/29/14  . History of partial hysterectomy 12/31/2016   Per patient.  Has not had a period in years.  Had a partial hysterectomy years ago.  Marland Kitchen Hypertension   . Mitral valve regurgitation   . Neuromuscular disorder (HCC)    neuropathies in hand  . Obesity   . Pancreatitis 1997  . Stroke [facility]) 2010   with mild left arm weakness     Past Surgical History:  Procedure Laterality Date  . CATARACT EXTRACTION W/PHACO Right 02/24/2019   Procedure: CATARACT EXTRACTION PHACO AND INTRAOCULAR LENS PLACEMENT ([place]) RIGHT DIABETES;  Surgeon: Marchia Meiers, MD;  Location: [facility] ORS;  Service: Ophthalmology;  Laterality: Right;  [place] 01:13.0 CDE 7.96 Fluid Pack Lot # [id number]  . CATARACT EXTRACTION W/PHACO Left 03/24/2019   Procedure: CATARACT EXTRACTION PHACO AND INTRAOCULAR LENS PLACEMENT ([facility]) - left diabetic;  Surgeon: Marchia Meiers, MD;  Location: [facility] ORS;  Service: Ophthalmology;  Laterality: Left;  [place]  01:36 CDE 13.93 Fluid pack lot # [id number]  . CESAREAN SECTION    . CHOLECYSTECTOMY    . EXCISION OF TONGUE LESION N/A 08/17/2018   Procedure: EXCISION OF TONGUE LESION WITH FROZEN SECTIONS;  Surgeon: Beverly Gust, MD;  Location: [facility] ORS;  Service: ENT;  Laterality: N/A;  . EYE SURGERY Right    cataract extraction  . PARTIAL HYSTERECTOMY  12/31/2016   Per patient, she has not had a period in years since she had a partial hysterectomy.  Marland Kitchen PORTA CATH INSERTION    . TUBAL LIGATION      Social History   Socioeconomic History  . Marital status: Single    Spouse name: S.O.. .... keith  . Number of children: Not on file  . Years of education: Not on file  . Highest education level: Not on file  Occupational History    Comment: disabled  Social Needs  . Financial resource strain: Not on file  . Food insecurity    Worry: Not on file    Inability: Not on file  . Transportation needs     Medical: Not on file    Non-medical: Not on file  Tobacco Use  . Smoking status: Former Smoker    Packs/day: 0.50    Years: 1.00    Pack years: 0.50    Types: Cigarettes  . Smokeless tobacco: Never Used  Substance and Sexual Activity  . Alcohol use: No    Alcohol/week: 0.0 standard drinks  . Drug use: No  . Sexual activity: Not on file    Comment: quit 8 years ago  Lifestyle  . Physical activity    Days per week: Not on file    Minutes per session: Not on file  . Stress: Not on file  Relationships  . Social Herbalist on phone: Not on file    Gets together: Not on file    Attends religious service: Not on file    Active member of club or organization: Not on file    Attends meetings of clubs or organizations:  Not on file    Relationship status: Not on file  . Intimate partner violence    Fear of current or ex partner: Not on file    Emotionally abused: Not on file    Physically abused: Not on file    Forced sexual activity: Not on file  Other Topics Concern  . Not on file  Social History Narrative  . Not on file    Family History  Problem Relation Age of Onset  . Ovarian cancer Mother 80  . Diabetes Mother   . Hypertension Mother   . COPD Father   . Hypertension Father   . Colon cancer Father 72  . Diabetes Sister   . Breast cancer Sister 57       bilateral  . Diabetes Brother   . Leukemia Maternal Aunt   . Pancreatic cancer Paternal Aunt 43  . Pancreatic cancer Paternal Uncle   . Colon cancer Paternal Uncle   . Stomach cancer Maternal Grandfather 16  . Throat cancer Paternal Grandmother   . Breast cancer Maternal Aunt 80  . Colon cancer Maternal Aunt   . Bone cancer Maternal Aunt   . Breast cancer Paternal Aunt        dx >50  . Prostate cancer Paternal Uncle   . Pancreatic cancer Paternal Uncle   . Throat cancer Paternal Uncle   . Lung cancer Paternal Uncle   . Stomach cancer Paternal Uncle   . Brain cancer Paternal Aunt   . Cancer  Cousin        liver, kidney  . Prostate cancer Cousin        meastatic  . Lung cancer Other      Current Outpatient Medications:  .  acetaminophen-codeine (TYLENOL #4) 300-60 MG tablet, Take 1-2 tablets by mouth every 4 (four) hours as needed for moderate pain. , Disp: , Rfl:  .  albuterol (PROAIR HFA) 108 (90 BASE) MCG/ACT inhaler, Inhale 2 puffs into the lungs every 6 (six) hours as needed for wheezing or shortness of breath. , Disp: , Rfl:  .  albuterol (PROVENTIL) (2.5 MG/3ML) 0.083% nebulizer solution, Inhale 2.5 mg into the lungs every 6 (six) hours as needed for shortness of breath. , Disp: , Rfl:  .  ALPRAZolam (XANAX) 0.5 MG tablet, Take 0.5 mg by mouth at bedtime as needed for anxiety or sleep. , Disp: , Rfl:  .  amLODipine (NORVASC) 10 MG tablet, Take 10 mg by mouth daily. , Disp: , Rfl:  .  amoxicillin-clavulanate (AUGMENTIN) 875-125 MG tablet, Take 1 tablet by mouth 2 (two) times daily., Disp: 20 tablet, Rfl: 0 .  aspirin EC 81 MG tablet, Take 81 mg by mouth daily. , Disp: , Rfl:  .  atenolol (TENORMIN) 100 MG tablet, Take 100 mg by mouth 2 (two) times daily. , Disp: , Rfl:  .  B-D ULTRA-FINE 33 LANCETS MISC, Use 1 each 2 (two) times daily., Disp: , Rfl:  .  B-D ULTRAFINE III SHORT PEN 31G X 8 MM MISC, , Disp: , Rfl:  .  bumetanide (BUMEX) 0.5 MG tablet, Take 0.5 mg by mouth 2 (two) times daily. , Disp: , Rfl:  .  calcitRIOL (ROCALTROL) 0.25 MCG capsule, Take 0.25 mcg by mouth every Monday, Wednesday, and Friday. , Disp: , Rfl:  .  Cinnamon 500 MG capsule, Take 1,000 mg by mouth daily. , Disp: , Rfl:  .  cloNIDine (CATAPRES) 0.2 MG tablet, Take 0.2 mg by mouth 2 (two) times  daily. , Disp: , Rfl:  .  enalapril (VASOTEC) 10 MG tablet, Take 20 mg by mouth 2 (two) times a day. , Disp: , Rfl:  .  famotidine (PEPCID) 20 MG tablet, Take 20 mg by mouth 2 (two) times daily., Disp: , Rfl:  .  ferrous sulfate 325 (65 FE) MG tablet, Take 325 mg by mouth 2 (two) times daily with a meal.,  Disp: , Rfl:  .  FLUoxetine (PROZAC) 20 MG capsule, Take 20 mg by mouth 2 (two) times daily. , Disp: , Rfl:  .  Fluticasone Propionate, Inhal, (FLOVENT DISKUS) 100 MCG/BLIST AEPB, Inhale 2 puffs into the lungs 2 (two) times a day. Inhale 2 inhalations into the lungs 2 times daily PRN , Disp: , Rfl:  .  gabapentin (NEURONTIN) 100 MG capsule, TAKE 1 CAPSULE(100 MG) BY MOUTH AT BEDTIME (Patient taking differently: Take 100 mg by mouth at bedtime. ), Disp: 30 capsule, Rfl: 6 .  glucose blood (ONE TOUCH ULTRA TEST) test strip, Use 1 each 2 (two) times daily. Use as instructed., Disp: , Rfl:  .  glyBURIDE (DIABETA) 5 MG tablet, Take 10 mg by mouth 2 (two) times daily with a meal. , Disp: , Rfl:  .  LEVEMIR FLEXTOUCH 100 UNIT/ML Pen, Inject 55 Units into the skin daily. , Disp: , Rfl:  .  loperamide (IMODIUM A-D) 2 MG capsule, Take 2-4 mg by mouth 4 (four) times daily as needed for diarrhea or loose stools. , Disp: , Rfl:  .  mometasone (NASONEX) 50 MCG/ACT nasal spray, Place 2 sprays into the nose daily as needed (Allergies). , Disp: , Rfl:  .  NOVOLOG FLEXPEN 100 UNIT/ML FlexPen, Inject 7 Units into the skin 2 (two) times daily. , Disp: , Rfl:  .  ondansetron (ZOFRAN) 8 MG tablet, One pill every 8 hours as needed for nausea/vomitting. (Patient taking differently: Take 8 mg by mouth every 8 (eight) hours as needed for nausea or vomiting. ), Disp: 40 tablet, Rfl: 1 .  prednisoLONE acetate (PRED FORTE) 1 % ophthalmic suspension, Place 1 drop into the left eye 2 (two) times daily., Disp: , Rfl:  .  prochlorperazine (COMPAZINE) 10 MG tablet, Take 1 tablet (10 mg total) by mouth every 6 (six) hours as needed for nausea or vomiting. Please note change in strength (Patient taking differently: Take 10 mg by mouth every 6 (six) hours as needed for nausea or vomiting. ), Disp: 60 tablet, Rfl: 4 .  salmeterol (SEREVENT) 50 MCG/DOSE diskus inhaler, Inhale 1 puff into the lungs daily as needed (shortness of breath). ,  Disp: , Rfl:  .  simvastatin (ZOCOR) 20 MG tablet, Take 20 mg by mouth daily at 6 PM. , Disp: , Rfl:  .  vitamin B-12 (CYANOCOBALAMIN) 1000 MCG tablet, Take 1,000 mcg by mouth daily., Disp: , Rfl:  No current facility-administered medications for this visit.   Facility-Administered Medications Ordered in Other Visits:  .  sodium chloride flush (NS) 0.9 % injection 10 mL, 10 mL, Intravenous, PRN, Cammie Sickle, MD, 10 mL at 01/30/16 1054  Physical exam: There were no vitals filed for this visit. Physical Exam Constitutional:      Appearance: Normal appearance.  HENT:     Head: Normocephalic and atraumatic.  Eyes:     Pupils: Pupils are equal, round, and reactive to light.  Neck:     Musculoskeletal: Normal range of motion.  Cardiovascular:     Rate and Rhythm: Normal rate and regular rhythm.  Heart sounds: Normal heart sounds. No murmur.  Pulmonary:     Effort: Pulmonary effort is normal.     Breath sounds: Normal breath sounds. No wheezing.  Abdominal:     General: Bowel sounds are normal. There is no distension.     Palpations: Abdomen is soft.     Tenderness: There is no abdominal tenderness.  Musculoskeletal: Normal range of motion.  Skin:    General: Skin is warm and dry.     Findings: No rash.  Neurological:     Mental Status: She is alert and oriented to person, place, and time.  Psychiatric:        Judgment: Judgment normal.      CMP Latest Ref Rng & Units 05/20/2019  Glucose 70 - 99 mg/dL 146(H)  BUN 8 - 23 mg/dL 38(H)  Creatinine 0.44 - 1.00 mg/dL 4.00(H)  Sodium 135 - 145 mmol/L 134(L)  Potassium 3.5 - 5.1 mmol/L 5.0  Chloride 98 - 111 mmol/L 106  CO2 22 - 32 mmol/L 20(L)  Calcium 8.9 - 10.3 mg/dL 8.5(L)  Total Protein 6.5 - 8.1 g/dL 7.4  Total Bilirubin 0.3 - 1.2 mg/dL 0.6  Alkaline Phos 38 - 126 U/L 66  AST 15 - 41 U/L 18  ALT 0 - 44 U/L 14   CBC Latest Ref Rng & Units 05/20/2019  WBC 4.0 - 10.5 K/uL 10.7(H)  Hemoglobin 12.0 - 15.0 g/dL  8.7(L)  Hematocrit 36.0 - 46.0 % 26.4(L)  Platelets 150 - 400 K/uL 341       No results found.   Assessment and plan- Patient is a 63 y.o. female who presents to symptom management for complaints of right thumb pain, swelling and yellow/white pus production.   Left breast cancer stage IV ER PR positive HER-2/neu negative: Most recent PET from September 8 revealed mild progression of disease.  She was started on eribulin-04/29/2019.  Currently tolerating well and due for next infusion today.  Right thumb injury: Recommend holding treatment for today and stat appointment with [facility] for possible incision and drainage.  Dr. Yevette Edwards agrees.  Plan: Hold treatment for today. Stat appointment with [facility].  Called ahead and will be seen at 1 PM today. Rx Augmentin twice daily x10 days.  Disposition: Return to clinic in 1 week for assessment, labs and possible treatment.   Visit Diagnosis 1. Abscess of right thumb   2. Carcinoma of upper-inner quadrant of left breast in female, estrogen receptor positive ([place])     Patient expressed understanding and was in agreement with this plan. She also understands that She can call clinic at any time with any questions, concerns, or complaints.   Greater than 50% was spent in counseling and coordination of care with this patient including but not limited to discussion of the relevant topics above (See A&P) including, but not limited to diagnosis and management of acute and chronic medical conditions.   Thank you for allowing me to participate in the care of this very pleasant patient.    Jacquelin Hawking, NP [place] [facility] Cell - [phone] Pager- [phone] 06/01/2019 2:03 PM

## 2019-05-18 DIAGNOSIS — I69351 Hemiplegia and hemiparesis following cerebral infarction affecting right dominant side: Secondary | ICD-10-CM | POA: Diagnosis not present

## 2019-05-18 DIAGNOSIS — C799 Secondary malignant neoplasm of unspecified site: Secondary | ICD-10-CM | POA: Diagnosis not present

## 2019-05-18 DIAGNOSIS — I509 Heart failure, unspecified: Secondary | ICD-10-CM | POA: Diagnosis not present

## 2019-05-18 DIAGNOSIS — K861 Other chronic pancreatitis: Secondary | ICD-10-CM | POA: Diagnosis not present

## 2019-05-18 DIAGNOSIS — Z79899 Other long term (current) drug therapy: Secondary | ICD-10-CM | POA: Insufficient documentation

## 2019-05-18 DIAGNOSIS — Z794 Long term (current) use of insulin: Secondary | ICD-10-CM | POA: Diagnosis not present

## 2019-05-18 DIAGNOSIS — E1122 Type 2 diabetes mellitus with diabetic chronic kidney disease: Secondary | ICD-10-CM | POA: Diagnosis not present

## 2019-05-18 DIAGNOSIS — I132 Hypertensive heart and chronic kidney disease with heart failure and with stage 5 chronic kidney disease, or end stage renal disease: Secondary | ICD-10-CM | POA: Diagnosis not present

## 2019-05-18 DIAGNOSIS — C50912 Malignant neoplasm of unspecified site of left female breast: Secondary | ICD-10-CM | POA: Diagnosis not present

## 2019-05-18 DIAGNOSIS — N185 Chronic kidney disease, stage 5: Secondary | ICD-10-CM | POA: Diagnosis not present

## 2019-05-19 ENCOUNTER — Other Ambulatory Visit: Payer: Self-pay

## 2019-05-19 NOTE — Progress Notes (Signed)
Pre-visit assessment completed prior to [place] appointment with Dr. Rogue Bussing on 05/19/2019. No concerns Identified at this time. Pt did report that she was rx'd a second round of antibiotics for her finger.

## 2019-05-20 ENCOUNTER — Inpatient Hospital Stay (HOSPITAL_BASED_OUTPATIENT_CLINIC_OR_DEPARTMENT_OTHER): Payer: Medicare HMO | Admitting: Internal Medicine

## 2019-05-20 ENCOUNTER — Telehealth: Payer: Self-pay | Admitting: Internal Medicine

## 2019-05-20 ENCOUNTER — Other Ambulatory Visit: Payer: Self-pay

## 2019-05-20 ENCOUNTER — Inpatient Hospital Stay: Payer: Medicare HMO

## 2019-05-20 DIAGNOSIS — C50212 Malignant neoplasm of upper-inner quadrant of left female breast: Secondary | ICD-10-CM

## 2019-05-20 DIAGNOSIS — Z170 Estrogen receptor positive status [ER+]: Secondary | ICD-10-CM

## 2019-05-20 DIAGNOSIS — F329 Major depressive disorder, single episode, unspecified: Secondary | ICD-10-CM | POA: Diagnosis not present

## 2019-05-20 DIAGNOSIS — D6481 Anemia due to antineoplastic chemotherapy: Secondary | ICD-10-CM | POA: Diagnosis not present

## 2019-05-20 DIAGNOSIS — L02511 Cutaneous abscess of right hand: Secondary | ICD-10-CM | POA: Diagnosis not present

## 2019-05-20 DIAGNOSIS — E119 Type 2 diabetes mellitus without complications: Secondary | ICD-10-CM | POA: Diagnosis not present

## 2019-05-20 DIAGNOSIS — C7951 Secondary malignant neoplasm of bone: Secondary | ICD-10-CM | POA: Diagnosis not present

## 2019-05-20 DIAGNOSIS — Z5111 Encounter for antineoplastic chemotherapy: Secondary | ICD-10-CM | POA: Diagnosis not present

## 2019-05-20 DIAGNOSIS — Z87891 Personal history of nicotine dependence: Secondary | ICD-10-CM | POA: Diagnosis not present

## 2019-05-20 LAB — CBC WITH DIFFERENTIAL/PLATELET
Abs Immature Granulocytes: 0.22 10*3/uL — ABNORMAL HIGH (ref 0.00–0.07)
Basophils Absolute: 0.1 10*3/uL (ref 0.0–0.1)
Basophils Relative: 1 %
Eosinophils Absolute: 0.1 10*3/uL (ref 0.0–0.5)
Eosinophils Relative: 1 %
HCT: 26.4 % — ABNORMAL LOW (ref 36.0–46.0)
Hemoglobin: 8.7 g/dL — ABNORMAL LOW (ref 12.0–15.0)
Immature Granulocytes: 2 %
Lymphocytes Relative: 16 %
Lymphs Abs: 1.7 10*3/uL (ref 0.7–4.0)
MCH: 30.9 pg (ref 26.0–34.0)
MCHC: 33 g/dL (ref 30.0–36.0)
MCV: 93.6 fL (ref 80.0–100.0)
Monocytes Absolute: 1 10*3/uL (ref 0.1–1.0)
Monocytes Relative: 10 %
Neutro Abs: 7.6 10*3/uL (ref 1.7–7.7)
Neutrophils Relative %: 70 %
Platelets: 341 10*3/uL (ref 150–400)
RBC: 2.82 MIL/uL — ABNORMAL LOW (ref 3.87–5.11)
RDW: 13.3 % (ref 11.5–15.5)
WBC: 10.7 10*3/uL — ABNORMAL HIGH (ref 4.0–10.5)
nRBC: 0 % (ref 0.0–0.2)

## 2019-05-20 LAB — COMPREHENSIVE METABOLIC PANEL
ALT: 14 U/L (ref 0–44)
AST: 18 U/L (ref 15–41)
Albumin: 3.9 g/dL (ref 3.5–5.0)
Alkaline Phosphatase: 66 U/L (ref 38–126)
Anion gap: 8 (ref 5–15)
BUN: 38 mg/dL — ABNORMAL HIGH (ref 8–23)
CO2: 20 mmol/L — ABNORMAL LOW (ref 22–32)
Calcium: 8.5 mg/dL — ABNORMAL LOW (ref 8.9–10.3)
Chloride: 106 mmol/L (ref 98–111)
Creatinine, Ser: 4 mg/dL — ABNORMAL HIGH (ref 0.44–1.00)
GFR calc Af Amer: 13 mL/min — ABNORMAL LOW (ref >60)
GFR calc non Af Amer: 11 mL/min — ABNORMAL LOW (ref >60)
Glucose, Bld: 146 mg/dL — ABNORMAL HIGH (ref 70–99)
Potassium: 5 mmol/L (ref 3.5–5.1)
Sodium: 134 mmol/L — ABNORMAL LOW (ref 135–145)
Total Bilirubin: 0.6 mg/dL (ref 0.3–1.2)
Total Protein: 7.4 g/dL (ref 6.5–8.1)

## 2019-05-20 MED ORDER — HEPARIN SOD (PORK) LOCK FLUSH 100 UNIT/ML IV SOLN
500.0000 [IU] | Freq: Once | INTRAVENOUS | Status: AC
  Administered 2019-05-20: 10:00:00 500 [IU] via INTRAVENOUS

## 2019-05-20 MED ORDER — SODIUM CHLORIDE 0.9% FLUSH
10.0000 mL | INTRAVENOUS | Status: DC | PRN
  Administered 2019-05-20: 08:00:00 10 mL via INTRAVENOUS
  Filled 2019-05-20: qty 10

## 2019-05-20 NOTE — Assessment & Plan Note (Addendum)
Left breast cancer- stage IV- ER/PR +, HER-2/neu.  PET scan April 12, 2019-mild progressive disease ; increased uptake pleural-based lesion/lung lesions; intense uptake right acetabular region otherwise stable bone lesions/left breast mass; tumor marker rising. Started Eribulin Marla Roe dosed [start at 1 mg/m] started on-04/29/2019.   #Hold eribulin-given the ongoing right thumb abscess/infection-see below  # Bone mets-sclerotic; Right acetabular uptake- but asymptomatic. STABLE; refer to Radiation Oncology for further consideration of possible radiation.   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  Infection/antibiotics Will inform Dr.Kolluru  # Anemia- hemoglobin today-8.7; again ? infection/renal function slightly worse.   # Right thumb paronychia/abscess [s/p I&D; [facility]]; poorly controlled diabetes. Recommend continued follow-up with Ortho/continue antibiotics.  Recommend holding chemotherapy before restarting treatment.   # wants to go to [facility] week of 8th Nov. Will review at next visit.   # DISPOSITION:  # HOLD CHEMO TODAY;  # follow up in 2 weeks labs/X-MD-cbc/cmp-ca-27-29; Eribulin - Dr.B

## 2019-05-20 NOTE — Telephone Encounter (Signed)
Please refer to Dr. Ulyses Amor bone met; patient aware of my recommendations.

## 2019-05-20 NOTE — Progress Notes (Addendum)
[name] OFFICE PROGRESS NOTE  Patient Care Team: [name], [name], MD as Medical Oncologist (Medical Oncology) Corey Skains, MD as Consulting Physician (Cardiology)  Cancer Staging No matching staging information was found for the patient.   Oncology History Overview Note  # OCT 2015-STAGE IV LEFT BREAST T2N1 [T=4cm; [name]; [name]; [name]; EBUS- Positive Paratrac/subcarinal LN s/p ? Taxotere [in [place]; [name]; [name];[name]; [name]; suv 3]; [name]; [name]; [name];  [name]; Ant R iliac bone; [name]; [name]; START-Taxol weekly. March 2020- Taxol every 2 weeks [PN]; SEP 2020- PET progression  # SEP 9/25/200- ERIBULIN   # Poorly controlled Blood sugars- improved.   # Pancreatitis Hx/PEI- on creon in past / CKD IV [creat ~ 3-4; [name]; [name]; mild left sided weakness]  # Jan 2020-  Lobular lesion on tongue- s/p excision pyogenic granuloma [Dr.McQueen]   # GENETIC TESTING/COUNSELLING: HETEROZYGOUS Cystic Fibrosis Gene [explains hx of recurrent pancreatitis]  # MOLECULAR TESTING: NA  ------------------------------------------------   DIAGNOSIS: [ 6063] BREAST CA; [name]; [name] ;GOALS: Palliative  CURRENT/MOST RECENT THERAPY: ERIBULIN.     Carcinoma of upper-inner quadrant of left breast in female, estrogen receptor positive ([place])  04/29/2019 -  Chemotherapy   The patient  had eriBULin mesylate (HALAVEN) 2 mg in sodium chloride 0.9 % 100 mL chemo infusion, 2 mg, Intravenous,  Once, 1 of 4 cycles Dose modification: 1 mg/m2 (original dose 1 mg/m2, Cycle 1, Reason: Provider Judgment) Administration: 2 mg (04/29/2019)  for chemotherapy treatment.     INTERVAL HISTORY:  Trisa A Connaughton 63 y.o.  female pleasant patient above history of metastatic ER PR positive HER-2 negative breast cancer-recently started on eribulin given the progression of disease 0 follow-up.  Patient has had eribulin 1 week cycle number 1 day 1 approximately 3 weeks ago.  Patient eribulin has been on hold because of right thumb abscess paronychia.  S/p resection of the nail/IND-[place].  She continues with antibiotics.  Keflex.  Otherwise no fever chills.  She continues to dress her finger twice a day.  She is currently awaiting repeat appointment in [place] next week.  She denies any significant pain.   Review of Systems  Constitutional: Positive for malaise/fatigue. Negative for chills, diaphoresis, [name], hemoptysis, sputum production, shortness of breath and wheezing.   Cardiovascular: Positive for leg swelling. Negative for chest pain, palpitations and orthopnea.  Gastrointestinal: Negative for abdominal pain, blood in stool, constipation, diarrhea, heartburn, melena, nausea and vomiting.  Genitourinary: Negative for dysuria, frequency and urgency.  Musculoskeletal: Negative for back pain and joint pain.  Skin: Negative.  Negative for itching and rash.  Neurological: Positive for tingling. Negative for dizziness, focal weakness, weakness and headaches.  Endo/Heme/Allergies: Does not bruise/bleed easily.  Psychiatric/Behavioral: Negative for depression. The patient is not nervous/anxious and does not have insomnia.  PAST MEDICAL HISTORY :  Past Medical  History:  Diagnosis Date  . Anemia   . Anxiety   . Asthma   . Cancer ([place]) 03/10/2018   Per [facility] PET order. Carcinoma of upper-inner quadrant of left breast in female, estrogen receptor positive .  Marland Kitchen Cancer (HCC)    LUNG  . CHF (congestive heart failure) ([place]) 1997  . CKD (chronic kidney disease)   . Depression   . Diabetes mellitus, type 2 ([place])   . Family history of breast cancer   . Family history of colon cancer   . Family history of ovarian cancer   . Family history of pancreatic cancer   . Family history of prostate cancer   . Family history of stomach cancer   . GERD (gastroesophageal reflux disease)    history of an ulcer  . Hair loss   . History of left breast cancer 05/29/14  . History of partial hysterectomy 12/31/2016   Per patient.  Has not had a period in years.  Had a partial hysterectomy years ago.  Marland Kitchen Hypertension   . Mitral valve regurgitation   . Neuromuscular disorder (HCC)    neuropathies in hand  . Obesity   . Pancreatitis 1997  . Stroke [facility]) 2010   with mild left arm weakness    PAST SURGICAL HISTORY :   Past Surgical History:  Procedure Laterality Date  . CATARACT EXTRACTION W/PHACO Right 02/24/2019   Procedure: CATARACT EXTRACTION PHACO AND INTRAOCULAR LENS PLACEMENT ([place]) RIGHT DIABETES;  Surgeon: Marchia Meiers, MD;  Location: [facility] ORS;  Service: Ophthalmology;  Laterality: Right;  [place] 01:13.0 CDE 7.96 Fluid Pack Lot # [id number]  . CATARACT EXTRACTION W/PHACO Left 03/24/2019   Procedure: CATARACT EXTRACTION PHACO AND INTRAOCULAR LENS PLACEMENT ([facility]) - left diabetic;  Surgeon: Marchia Meiers, MD;  Location: [facility] ORS;  Service: Ophthalmology;  Laterality: Left;  [place]  01:36 CDE 13.93 Fluid pack lot # [id number]  . CESAREAN SECTION    . CHOLECYSTECTOMY    . EXCISION OF TONGUE LESION N/A 08/17/2018   Procedure: EXCISION OF TONGUE LESION WITH FROZEN SECTIONS;  Surgeon: Beverly Gust, MD;  Location: [facility] ORS;  Service: ENT;  Laterality: N/A;  . EYE SURGERY  Right    cataract extraction  . PARTIAL HYSTERECTOMY  12/31/2016   Per patient, she has not had a period in years since she had a partial hysterectomy.  Marland Kitchen PORTA CATH INSERTION    . TUBAL LIGATION      FAMILY HISTORY :   Family History  Problem Relation Age of Onset  . Ovarian cancer Mother 7  . Diabetes Mother   . Hypertension Mother   . COPD Father   . Hypertension Father   . Colon cancer Father 38  . Diabetes Sister   . Breast cancer Sister 56       bilateral  . Diabetes Brother   . Leukemia Maternal Aunt   . Pancreatic cancer Paternal Aunt 14  . Pancreatic cancer Paternal Uncle   . Colon cancer Paternal Uncle   . Stomach cancer Maternal Grandfather 27  . Throat cancer Paternal Grandmother   . Breast cancer Maternal Aunt 80  . Colon cancer Maternal Aunt   . Bone cancer Maternal Aunt   . Breast cancer Paternal Aunt        dx >50  . Prostate cancer Paternal Uncle   . Pancreatic cancer Paternal Uncle   . Throat cancer Paternal Uncle   . Lung cancer Paternal Uncle   .  Stomach cancer Paternal Uncle   . Brain cancer Paternal Aunt   . Cancer Cousin        liver, kidney  . Prostate cancer Cousin        meastatic  . Lung cancer Other     SOCIAL HISTORY:   Social History   Tobacco Use  . Smoking status: Former Smoker    Packs/day: 0.50    Years: 1.00    Pack years: 0.50    Types: Cigarettes  . Smokeless tobacco: Never Used  Substance Use Topics  . Alcohol use: No    Alcohol/week: 0.0 standard drinks  . Drug use: No    ALLERGIES:  has No Known Allergies.  MEDICATIONS:  Current Outpatient Medications  Medication Sig Dispense Refill  . acetaminophen-codeine (TYLENOL #4) 300-60 MG tablet Take 1-2 tablets by mouth every 4 (four) hours as needed for moderate pain.     Marland Kitchen albuterol (PROAIR HFA) 108 (90 BASE) MCG/ACT inhaler Inhale 2 puffs into the lungs every 6 (six) hours as needed for wheezing or shortness of breath.     Marland Kitchen albuterol (PROVENTIL) (2.5 MG/3ML)  0.083% nebulizer solution Inhale 2.5 mg into the lungs every 6 (six) hours as needed for shortness of breath.     . ALPRAZolam (XANAX) 0.5 MG tablet Take 0.5 mg by mouth at bedtime as needed for anxiety or sleep.     Marland Kitchen amLODipine (NORVASC) 10 MG tablet Take 10 mg by mouth daily.     Marland Kitchen amoxicillin-clavulanate (AUGMENTIN) 875-125 MG tablet Take 1 tablet by mouth 2 (two) times daily. 20 tablet 0  . aspirin EC 81 MG tablet Take 81 mg by mouth daily.     Marland Kitchen atenolol (TENORMIN) 100 MG tablet Take 100 mg by mouth 2 (two) times daily.     . B-D ULTRA-FINE 33 LANCETS MISC Use 1 each 2 (two) times daily.    . B-D ULTRAFINE III SHORT PEN 31G X 8 MM MISC     . bumetanide (BUMEX) 0.5 MG tablet Take 0.5 mg by mouth 2 (two) times daily.     . calcitRIOL (ROCALTROL) 0.25 MCG capsule Take 0.25 mcg by mouth every Monday, Wednesday, and Friday.     . Cinnamon 500 MG capsule Take 1,000 mg by mouth daily.     . cloNIDine (CATAPRES) 0.2 MG tablet Take 0.2 mg by mouth 2 (two) times daily.     . enalapril (VASOTEC) 10 MG tablet Take 20 mg by mouth 2 (two) times a day.     . famotidine (PEPCID) 20 MG tablet Take 20 mg by mouth 2 (two) times daily.    . ferrous sulfate 325 (65 FE) MG tablet Take 325 mg by mouth 2 (two) times daily with a meal.    . FLUoxetine (PROZAC) 20 MG capsule Take 20 mg by mouth 2 (two) times daily.     . Fluticasone Propionate, Inhal, (FLOVENT DISKUS) 100 MCG/BLIST AEPB Inhale 2 puffs into the lungs 2 (two) times a day. Inhale 2 inhalations into the lungs 2 times daily PRN     . gabapentin (NEURONTIN) 100 MG capsule TAKE 1 CAPSULE(100 MG) BY MOUTH AT BEDTIME (Patient taking differently: Take 100 mg by mouth at bedtime. ) 30 capsule 6  . glucose blood (ONE TOUCH ULTRA TEST) test strip Use 1 each 2 (two) times daily. Use as instructed.    . glyBURIDE (DIABETA) 5 MG tablet Take 10 mg by mouth 2 (two) times daily with a meal.     .  LEVEMIR FLEXTOUCH 100 UNIT/ML Pen Inject 55 Units into the skin daily.      Marland Kitchen loperamide (IMODIUM A-D) 2 MG capsule Take 2-4 mg by mouth 4 (four) times daily as needed for diarrhea or loose stools.     . mometasone (NASONEX) 50 MCG/ACT nasal spray Place 2 sprays into the nose daily as needed (Allergies).     . NOVOLOG FLEXPEN 100 UNIT/ML FlexPen Inject 7 Units into the skin 2 (two) times daily.     . ondansetron (ZOFRAN) 8 MG tablet One pill every 8 hours as needed for nausea/vomitting. (Patient taking differently: Take 8 mg by mouth every 8 (eight) hours as needed for nausea or vomiting. ) 40 tablet 1  . prednisoLONE acetate (PRED FORTE) 1 % ophthalmic suspension Place 1 drop into the left eye 2 (two) times daily.    . prochlorperazine (COMPAZINE) 10 MG tablet Take 1 tablet (10 mg total) by mouth every 6 (six) hours as needed for nausea or vomiting. Please note change in strength (Patient taking differently: Take 10 mg by mouth every 6 (six) hours as needed for nausea or vomiting. ) 60 tablet 4  . salmeterol (SEREVENT) 50 MCG/DOSE diskus inhaler Inhale 1 puff into the lungs daily as needed (shortness of breath).     . simvastatin (ZOCOR) 20 MG tablet Take 20 mg by mouth daily at 6 PM.     . vitamin B-12 (CYANOCOBALAMIN) 1000 MCG tablet Take 1,000 mcg by mouth daily.     No current facility-administered medications for this visit.    Facility-Administered Medications Ordered in Other Visits  Medication Dose Route Frequency Provider Last Rate Last Dose  . sodium chloride flush (NS) 0.9 % injection 10 mL  10 mL Intravenous PRN Cammie Sickle, MD   10 mL at 01/30/16 1054    PHYSICAL EXAMINATION: ECOG PERFORMANCE STATUS: 1 - Symptomatic but completely ambulatory  BP (!) 142/79   Pulse 68   Temp (!) 97.2 F (36.2 C) (Tympanic)   Resp 20   Ht 5' 3"  (1.6 m)   Wt 217 lb (98.4 kg)   BMI 38.44 kg/m   Filed Weights   05/20/19 0857  Weight: 217 lb (98.4 kg)    Physical Exam  Constitutional: She is oriented to person, place, and time and well-developed,  well-nourished, and in no distress.  She is alone.  HENT:  Head: Normocephalic and atraumatic.  Mouth/Throat: Oropharynx is clear and moist. No oropharyngeal exudate.  Eyes: Pupils are equal, round, and reactive to light.  Neck: Normal range of motion. Neck supple.  Cardiovascular: Normal rate and regular rhythm.  Pulmonary/Chest: Breath sounds normal. No respiratory distress. She has no wheezes.  Abdominal: Soft. Bowel sounds are normal. She exhibits no distension and no mass. There is no abdominal tenderness. There is no rebound and no guarding.  Musculoskeletal: Normal range of motion.        General: Edema present. No tenderness.  Neurological: She is alert and oriented to person, place, and time.  Skin: Skin is warm.  Right thumb bandaged-avulsion of the nail-black eschar/puslike discharge noted.   Psychiatric: Affect normal.    LABORATORY DATA:  I have reviewed the data as listed    Component Value Date/Time   NA 134 (L) 05/20/2019 0803   NA 130 (L) 06/06/2014 1102   K 5.0 05/20/2019 0803   K 3.9 06/06/2014 1102   CL 106 05/20/2019 0803   CL 95 (L) 06/06/2014 1102   CO2 20 (L) 05/20/2019 8413  CO2 28 06/06/2014 1102   GLUCOSE 146 (H) 05/20/2019 0803   GLUCOSE 349 (H) 06/06/2014 1102   BUN 38 (H) 05/20/2019 0803   BUN 17 06/06/2014 1102   CREATININE 4.00 (H) 05/20/2019 0803   CREATININE 1.63 (H) 06/06/2014 1102   CALCIUM 8.5 (L) 05/20/2019 0803   CALCIUM 9.2 06/06/2014 1102   PROT 7.4 05/20/2019 0803   PROT 8.2 06/06/2014 1102   ALBUMIN 3.9 05/20/2019 0803   ALBUMIN 3.3 (L) 06/06/2014 1102   AST 18 05/20/2019 0803   AST 7 (L) 06/06/2014 1102   ALT 14 05/20/2019 0803   ALT 12 (L) 06/06/2014 1102   ALKPHOS 66 05/20/2019 0803   ALKPHOS 74 06/06/2014 1102   BILITOT 0.6 05/20/2019 0803   BILITOT 0.4 06/06/2014 1102   GFRNONAA 11 (L) 05/20/2019 0803   GFRNONAA 35 (L) 06/06/2014 1102   GFRAA 13 (L) 05/20/2019 0803   GFRAA 42 (L) 06/06/2014 1102    No results  found for: SPEP, UPEP  Lab Results  Component Value Date   WBC 10.7 (H) 05/20/2019   NEUTROABS 7.6 05/20/2019   HGB 8.7 (L) 05/20/2019   HCT 26.4 (L) 05/20/2019   MCV 93.6 05/20/2019   PLT 341 05/20/2019      Chemistry      Component Value Date/Time   NA 134 (L) 05/20/2019 0803   NA 130 (L) 06/06/2014 1102   K 5.0 05/20/2019 0803   K 3.9 06/06/2014 1102   CL 106 05/20/2019 0803   CL 95 (L) 06/06/2014 1102   CO2 20 (L) 05/20/2019 0803   CO2 28 06/06/2014 1102   BUN 38 (H) 05/20/2019 0803   BUN 17 06/06/2014 1102   CREATININE 4.00 (H) 05/20/2019 0803   CREATININE 1.63 (H) 06/06/2014 1102      Component Value Date/Time   CALCIUM 8.5 (L) 05/20/2019 0803   CALCIUM 9.2 06/06/2014 1102   ALKPHOS 66 05/20/2019 0803   ALKPHOS 74 06/06/2014 1102   AST 18 05/20/2019 0803   AST 7 (L) 06/06/2014 1102   ALT 14 05/20/2019 0803   ALT 12 (L) 06/06/2014 1102   BILITOT 0.6 05/20/2019 0803   BILITOT 0.4 06/06/2014 1102       RADIOGRAPHIC STUDIES: I have personally reviewed the radiological images as listed and agreed with the findings in the report. No results found.   ASSESSMENT & PLAN:  Carcinoma of upper-inner quadrant of left breast in female, estrogen receptor positive ([place]) Left breast cancer- stage IV- ER/PR +, HER-2/neu.  PET scan April 12, 2019-mild progressive disease ; increased uptake pleural-based lesion/lung lesions; intense uptake right acetabular region otherwise stable bone lesions/left breast mass; tumor marker rising. Started Eribulin Marla Roe dosed [start at 1 mg/m] started on-04/29/2019.   #Hold eribulin-given the ongoing right thumb abscess/infection-see below  # Bone mets-sclerotic; Right acetabular uptake- but asymptomatic. STABLE; refer to Radiation Oncology for further consideration of possible radiation.   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  Infection/antibiotics Will inform Dr.Kolluru  # Anemia- hemoglobin today-8.7; again ? infection/renal function slightly worse.   # Right thumb paronychia/abscess [s/p I&D; [facility]]; poorly controlled diabetes. Recommend continued follow-up with Ortho/continue antibiotics.  Recommend holding chemotherapy before restarting treatment.   # wants to go to [facility] week of 8th Nov. Will review at next visit.   # DISPOSITION:  # HOLD CHEMO TODAY;  #  follow up in 2 weeks labs/X-MD-cbc/cmp-ca-27-29; Eribulin - Dr.B   No orders of the defined types were placed in this encounter.  All questions were answered. The patient knows to call the clinic with any problems, questions or concerns.      Cammie Sickle, MD 05/20/2019 12:39 PM

## 2019-05-21 LAB — CANCER ANTIGEN 27.29: CA 27.29: 83.5 U/mL — ABNORMAL HIGH (ref 0.0–38.6)

## 2019-05-26 ENCOUNTER — Other Ambulatory Visit: Payer: Self-pay

## 2019-05-26 ENCOUNTER — Encounter: Payer: Self-pay | Admitting: Radiation Oncology

## 2019-05-26 ENCOUNTER — Ambulatory Visit
Admission: RE | Admit: 2019-05-26 | Discharge: 2019-05-26 | Disposition: A | Discharge status: Home / Self Care | Payer: Medicare HMO | Source: Ambulatory Visit | Attending: Radiation Oncology | Admitting: Radiation Oncology

## 2019-05-26 VITALS — BP 120/80 | HR 68 | Temp 96.9°F | Resp 18 | Wt 204.2 lb

## 2019-05-26 DIAGNOSIS — C7951 Secondary malignant neoplasm of bone: Secondary | ICD-10-CM

## 2019-05-26 DIAGNOSIS — Z01818 Encounter for other preprocedural examination: Secondary | ICD-10-CM | POA: Diagnosis not present

## 2019-05-26 DIAGNOSIS — Z170 Estrogen receptor positive status [ER+]: Secondary | ICD-10-CM | POA: Diagnosis not present

## 2019-05-26 DIAGNOSIS — C50212 Malignant neoplasm of upper-inner quadrant of left female breast: Secondary | ICD-10-CM | POA: Diagnosis not present

## 2019-05-26 DIAGNOSIS — I96 Gangrene, not elsewhere classified: Secondary | ICD-10-CM | POA: Diagnosis not present

## 2019-05-26 DIAGNOSIS — L03011 Cellulitis of right finger: Secondary | ICD-10-CM | POA: Diagnosis not present

## 2019-05-26 NOTE — Consult Note (Signed)
NEW PATIENT EVALUATION  Name: [name]  MRN: [id number]  Date:   05/26/2019     DOB: February 08, 1956   This 63 y.o. 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  REFERRING PHYSICIAN: [name], MD  CHIEF COMPLAINT:  Chief Complaint  Patient presents with  . 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   PREVIOUS INVESTIGATIONS:  PET scan reviewed Pathology report reviewed Clinical notes reviewed  HPI: Patient is a 63 year old female diagnosed in 2015 with stage IV left breast cancer ER/PR positive HER-2/neu negative.  She had periaortic and subcarinal lymph nodes at the time of biopsy.  She has been on multiple therapies including Ibrance and Femara.  PET scan in 2016 demonstrated sclerotic lesions at T10 right iliac bone consistent with metastatic disease.  She has also undergone in 2019 Taxol treatments upon progression of pleural-based mets.  Patient has recently startederibulin.  She has developed the right thumb abscess which is being operated on early next week.  Repeat PET CT scan shows hypermetabolic activity in the right acetabulum.  She is asymptomatic.  She specifically [place] difficulty ambulating or lack of range of motion in her lower extremities.  I have asked to evaluate her for possible palliative radiation therapy to this lesion.  PLANNED TREATMENT REGIMEN: Right hypofractionated palliative radiation therapy to right acetabulum  PAST MEDICAL HISTORY:  has a past medical history of Anemia, Anxiety, Asthma, Cancer ([place]) (03/10/2018), Cancer ([place]), CHF (congestive heart failure) ([place]) (1997), CKD (chronic kidney disease), Depression, Diabetes mellitus, type 2 ([place]), Family history of breast cancer, Family history of colon cancer, Family history of ovarian cancer, Family history of pancreatic cancer,  Family history of prostate cancer, Family history of stomach cancer, GERD (gastroesophageal reflux disease), Hair loss, History of left breast cancer (05/29/14), History of partial hysterectomy (12/31/2016), Hypertension, Mitral valve regurgitation, Neuromuscular disorder ([place]), Obesity, Pancreatitis (1997), and Stroke ([place]) (2010).    PAST SURGICAL HISTORY:  Past Surgical History:  Procedure Laterality Date  . CATARACT EXTRACTION W/PHACO Right 02/24/2019   Procedure: CATARACT EXTRACTION PHACO AND INTRAOCULAR LENS PLACEMENT ([place]) RIGHT DIABETES;  Surgeon: Marchia Meiers, MD;  Location: [facility] ORS;  Service: Ophthalmology;  Laterality: Right;  [place] 01:13.0 CDE 7.96 Fluid Pack Lot # [id number]  . CATARACT EXTRACTION W/PHACO Left 03/24/2019   Procedure: CATARACT EXTRACTION PHACO AND INTRAOCULAR LENS PLACEMENT ([facility]) - left diabetic;  Surgeon: Marchia Meiers, MD;  Location: [facility] ORS;  Service: Ophthalmology;  Laterality: Left;  [place]  01:36 CDE 13.93 Fluid pack lot # [id number]  . CESAREAN SECTION    . CHOLECYSTECTOMY    . EXCISION OF TONGUE LESION N/A 08/17/2018   Procedure: EXCISION OF TONGUE LESION WITH FROZEN SECTIONS;  Surgeon: Beverly Gust, MD;  Location: [facility] ORS;  Service: ENT;  Laterality: N/A;  . EYE SURGERY Right    cataract extraction  . PARTIAL HYSTERECTOMY  12/31/2016   Per patient, she has not had a period in years since she had a partial hysterectomy.  Marland Kitchen PORTA CATH INSERTION    . TUBAL LIGATION      FAMILY HISTORY: family history includes Bone cancer in her maternal aunt; Brain cancer in her paternal aunt; Breast cancer in her paternal aunt; Breast cancer (age of onset: 57) in her sister; Breast cancer (age of onset: 4) in her maternal aunt; COPD in her father; Cancer in  her cousin; Colon cancer in her maternal aunt and paternal uncle; Colon cancer (age of onset: 7) in her father; Diabetes in her brother, mother, and sister; Hypertension in her father and mother; Leukemia in her maternal  aunt; Lung cancer in her paternal uncle and another family member; Ovarian cancer (age of onset: 90) in her mother; Pancreatic cancer in her paternal uncle and paternal uncle; Pancreatic cancer (age of onset: 57) in her paternal aunt; Prostate cancer in her cousin and paternal uncle; Stomach cancer in her paternal uncle; Stomach cancer (age of onset: 77) in her maternal grandfather; Throat cancer in her paternal grandmother and paternal uncle.  SOCIAL HISTORY:  reports that she has quit smoking. Her smoking use included cigarettes. She has a 0.50 pack-year smoking history. She has never used smokeless tobacco. She reports that she does not drink alcohol or use drugs.  ALLERGIES: Patient has no known allergies.  MEDICATIONS:  Current Outpatient Medications  Medication Sig Dispense Refill  . acetaminophen-codeine (TYLENOL #4) 300-60 MG tablet Take 1-2 tablets by mouth every 4 (four) hours as needed for moderate pain.     Marland Kitchen albuterol (PROAIR HFA) 108 (90 BASE) MCG/ACT inhaler Inhale 2 puffs into the lungs every 6 (six) hours as needed for wheezing or shortness of breath.     Marland Kitchen albuterol (PROVENTIL) (2.5 MG/3ML) 0.083% nebulizer solution Inhale 2.5 mg into the lungs every 6 (six) hours as needed for shortness of breath.     . ALPRAZolam (XANAX) 0.5 MG tablet Take 0.5 mg by mouth at bedtime as needed for anxiety or sleep.     Marland Kitchen amLODipine (NORVASC) 10 MG tablet Take 10 mg by mouth daily.     Marland Kitchen amoxicillin-clavulanate (AUGMENTIN) 875-125 MG tablet Take 1 tablet by mouth 2 (two) times daily. 20 tablet 0  . aspirin EC 81 MG tablet Take 81 mg by mouth daily.     Marland Kitchen atenolol (TENORMIN) 100 MG tablet Take 100 mg by mouth 2 (two) times daily.     . B-D ULTRA-FINE 33 LANCETS MISC Use 1 each 2 (two) times daily.    . B-D ULTRAFINE III SHORT PEN 31G X 8 MM MISC     . bumetanide (BUMEX) 0.5 MG tablet Take 0.5 mg by mouth 2 (two) times daily.     . calcitRIOL (ROCALTROL) 0.25 MCG capsule Take 0.25 mcg by mouth  every Monday, Wednesday, and Friday.     . Cinnamon 500 MG capsule Take 1,000 mg by mouth daily.     . cloNIDine (CATAPRES) 0.2 MG tablet Take 0.2 mg by mouth 2 (two) times daily.     . enalapril (VASOTEC) 10 MG tablet Take 20 mg by mouth 2 (two) times a day.     . famotidine (PEPCID) 20 MG tablet Take 20 mg by mouth 2 (two) times daily.    . ferrous sulfate 325 (65 FE) MG tablet Take 325 mg by mouth 2 (two) times daily with a meal.    . FLUoxetine (PROZAC) 20 MG capsule Take 20 mg by mouth 2 (two) times daily.     . Fluticasone Propionate, Inhal, (FLOVENT DISKUS) 100 MCG/BLIST AEPB Inhale 2 puffs into the lungs 2 (two) times a day. Inhale 2 inhalations into the lungs 2 times daily PRN     . gabapentin (NEURONTIN) 100 MG capsule TAKE 1 CAPSULE(100 MG) BY MOUTH AT BEDTIME (Patient taking differently: Take 100 mg by mouth at bedtime. ) 30 capsule 6  . glucose blood (ONE TOUCH ULTRA TEST)  test strip Use 1 each 2 (two) times daily. Use as instructed.    . glyBURIDE (DIABETA) 5 MG tablet Take 10 mg by mouth 2 (two) times daily with a meal.     . LEVEMIR FLEXTOUCH 100 UNIT/ML Pen Inject 55 Units into the skin daily.     Marland Kitchen loperamide (IMODIUM A-D) 2 MG capsule Take 2-4 mg by mouth 4 (four) times daily as needed for diarrhea or loose stools.     . mometasone (NASONEX) 50 MCG/ACT nasal spray Place 2 sprays into the nose daily as needed (Allergies).     . NOVOLOG FLEXPEN 100 UNIT/ML FlexPen Inject 7 Units into the skin 2 (two) times daily.     . ondansetron (ZOFRAN) 8 MG tablet One pill every 8 hours as needed for nausea/vomitting. (Patient taking differently: Take 8 mg by mouth every 8 (eight) hours as needed for nausea or vomiting. ) 40 tablet 1  . prednisoLONE acetate (PRED FORTE) 1 % ophthalmic suspension Place 1 drop into the left eye 2 (two) times daily.    . prochlorperazine (COMPAZINE) 10 MG tablet Take 1 tablet (10 mg total) by mouth every 6 (six) hours as needed for nausea or vomiting. Please note  change in strength (Patient taking differently: Take 10 mg by mouth every 6 (six) hours as needed for nausea or vomiting. ) 60 tablet 4  . salmeterol (SEREVENT) 50 MCG/DOSE diskus inhaler Inhale 1 puff into the lungs daily as needed (shortness of breath).     . simvastatin (ZOCOR) 20 MG tablet Take 20 mg by mouth daily at 6 PM.     . vitamin B-12 (CYANOCOBALAMIN) 1000 MCG tablet Take 1,000 mcg by mouth daily.     No current facility-administered medications for this encounter.    Facility-Administered Medications Ordered in Other Encounters  Medication Dose Route Frequency Provider Last Rate Last Dose  . sodium chloride flush (NS) 0.9 % injection 10 mL  10 mL Intravenous PRN Cammie Sickle, MD   10 mL at 01/30/16 1054    ECOG PERFORMANCE STATUS:  0 - Asymptomatic  REVIEW OF SYSTEMS: Patient denies any weight loss, fatigue, weakness, fever, chills or night sweats. Patient denies any loss of vision, blurred vision. Patient denies any ringing  of the ears or hearing loss. No irregular heartbeat. Patient denies heart murmur or history of fainting. Patient denies any chest pain or pain radiating to her upper extremities. Patient denies any shortness of breath, difficulty breathing at night, cough or hemoptysis. Patient denies any swelling in the lower legs. Patient denies any nausea vomiting, vomiting of blood, or coffee ground material in the vomitus. Patient denies any stomach pain. Patient states has had normal bowel movements no significant constipation or diarrhea. Patient denies any dysuria, hematuria or significant nocturia. Patient denies any problems walking, swelling in the joints or loss of balance. Patient denies any skin changes, loss of hair or loss of weight. Patient denies any excessive worrying or anxiety or significant depression. Patient denies any problems with insomnia. Patient denies excessive thirst, polyuria, polydipsia. Patient denies any swollen glands, patient denies easy  bruising or easy bleeding. Patient denies any recent infections, allergies or URI. Patient "s visual fields have not changed significantly in recent time.   PHYSICAL EXAM: BP 120/80 (BP Location: Left Arm, Patient Position: Sitting)   Pulse 68   Temp (!) 96.9 F (36.1 C) (Tympanic)   Resp 18   Wt 204 lb 3.2 oz (92.6 kg)   BMI 36.17 kg/m  Range of motion of the lower extremities does not elicit pain.  She does have a palpable mass in her left breast.  Well-developed well-nourished patient in NAD. HEENT reveals PERLA, EOMI, discs not visualized.  Oral cavity is clear. No oral mucosal lesions are identified. Neck is clear without evidence of cervical or supraclavicular adenopathy. Lungs are clear to A&P. Cardiac examination is essentially unremarkable with regular rate and rhythm without murmur rub or thrill. Abdomen is benign with no organomegaly or masses noted. Motor sensory and DTR levels are equal and symmetric in the upper and lower extremities. Cranial nerves II through XII are grossly intact. Proprioception is intact. No peripheral adenopathy or edema is identified. No motor or sensory levels are noted. Crude visual fields are within normal range.  LABORATORY DATA: Pathology report reviewed    RADIOLOGY RESULTS: PET CT scan is reviewed and compatible with above-stated findings   IMPRESSION: Progressive or persistent metastatic disease to the bone and especially in the right acetabulum in 63 year old female with stage IV breast cancer  PLAN: At this time like to go ahead with a short hypofractionated course of radiation therapy to the right acetabulum.  We will plan on delivering 2000 cGy in 5 fractions.  Risks and benefits of treatment occluding skin reaction fatigue all were discussed in detail.  I have personally set up and ordered CT simulation for the following week to allow her to heal from her throat thumb surgery.  Patient comprehends my treatment plan well.  I would like to take  this opportunity to thank you for allowing me to participate in the care of your patient.Noreene Filbert, MD

## 2019-05-27 ENCOUNTER — Ambulatory Visit: Payer: Medicare HMO | Admitting: Radiation Oncology

## 2019-05-27 DIAGNOSIS — M25541 Pain in joints of right hand: Secondary | ICD-10-CM | POA: Diagnosis not present

## 2019-05-27 DIAGNOSIS — R809 Proteinuria, unspecified: Secondary | ICD-10-CM | POA: Diagnosis not present

## 2019-05-27 DIAGNOSIS — I132 Hypertensive heart and chronic kidney disease with heart failure and with stage 5 chronic kidney disease, or end stage renal disease: Secondary | ICD-10-CM | POA: Diagnosis not present

## 2019-05-27 DIAGNOSIS — E1122 Type 2 diabetes mellitus with diabetic chronic kidney disease: Secondary | ICD-10-CM | POA: Diagnosis not present

## 2019-05-27 DIAGNOSIS — N185 Chronic kidney disease, stage 5: Secondary | ICD-10-CM | POA: Diagnosis not present

## 2019-05-27 DIAGNOSIS — N2581 Secondary hyperparathyroidism of renal origin: Secondary | ICD-10-CM | POA: Diagnosis not present

## 2019-05-27 DIAGNOSIS — G8918 Other acute postprocedural pain: Secondary | ICD-10-CM | POA: Diagnosis not present

## 2019-05-27 DIAGNOSIS — E1152 Type 2 diabetes mellitus with diabetic peripheral angiopathy with gangrene: Secondary | ICD-10-CM | POA: Diagnosis not present

## 2019-05-27 DIAGNOSIS — D631 Anemia in chronic kidney disease: Secondary | ICD-10-CM | POA: Diagnosis not present

## 2019-05-27 DIAGNOSIS — I96 Gangrene, not elsewhere classified: Secondary | ICD-10-CM | POA: Diagnosis not present

## 2019-05-27 DIAGNOSIS — L03011 Cellulitis of right finger: Secondary | ICD-10-CM | POA: Diagnosis not present

## 2019-05-27 DIAGNOSIS — E871 Hypo-osmolality and hyponatremia: Secondary | ICD-10-CM | POA: Diagnosis not present

## 2019-06-03 ENCOUNTER — Inpatient Hospital Stay (HOSPITAL_BASED_OUTPATIENT_CLINIC_OR_DEPARTMENT_OTHER): Payer: Medicare HMO | Admitting: Oncology

## 2019-06-03 ENCOUNTER — Inpatient Hospital Stay: Payer: Medicare HMO

## 2019-06-03 ENCOUNTER — Other Ambulatory Visit: Payer: Self-pay

## 2019-06-03 ENCOUNTER — Encounter: Payer: Self-pay | Admitting: Oncology

## 2019-06-03 VITALS — BP 147/86 | HR 70 | Temp 96.2°F | Ht 63.0 in | Wt 201.0 lb

## 2019-06-03 DIAGNOSIS — C50919 Malignant neoplasm of unspecified site of unspecified female breast: Secondary | ICD-10-CM | POA: Diagnosis not present

## 2019-06-03 DIAGNOSIS — T451X5A Adverse effect of antineoplastic and immunosuppressive drugs, initial encounter: Secondary | ICD-10-CM

## 2019-06-03 DIAGNOSIS — L02511 Cutaneous abscess of right hand: Secondary | ICD-10-CM | POA: Diagnosis not present

## 2019-06-03 DIAGNOSIS — Z5111 Encounter for antineoplastic chemotherapy: Secondary | ICD-10-CM

## 2019-06-03 DIAGNOSIS — N184 Chronic kidney disease, stage 4 (severe): Secondary | ICD-10-CM

## 2019-06-03 DIAGNOSIS — R7989 Other specified abnormal findings of blood chemistry: Secondary | ICD-10-CM

## 2019-06-03 DIAGNOSIS — C7951 Secondary malignant neoplasm of bone: Secondary | ICD-10-CM | POA: Diagnosis not present

## 2019-06-03 DIAGNOSIS — C50212 Malignant neoplasm of upper-inner quadrant of left female breast: Secondary | ICD-10-CM | POA: Diagnosis not present

## 2019-06-03 DIAGNOSIS — C799 Secondary malignant neoplasm of unspecified site: Secondary | ICD-10-CM

## 2019-06-03 DIAGNOSIS — F329 Major depressive disorder, single episode, unspecified: Secondary | ICD-10-CM | POA: Diagnosis not present

## 2019-06-03 DIAGNOSIS — Z170 Estrogen receptor positive status [ER+]: Secondary | ICD-10-CM

## 2019-06-03 DIAGNOSIS — D6481 Anemia due to antineoplastic chemotherapy: Secondary | ICD-10-CM

## 2019-06-03 DIAGNOSIS — Z95828 Presence of other vascular implants and grafts: Secondary | ICD-10-CM

## 2019-06-03 DIAGNOSIS — Z87891 Personal history of nicotine dependence: Secondary | ICD-10-CM | POA: Diagnosis not present

## 2019-06-03 DIAGNOSIS — Z7189 Other specified counseling: Secondary | ICD-10-CM

## 2019-06-03 DIAGNOSIS — E119 Type 2 diabetes mellitus without complications: Secondary | ICD-10-CM | POA: Diagnosis not present

## 2019-06-03 LAB — COMPREHENSIVE METABOLIC PANEL
ALT: 9 U/L (ref 0–44)
AST: 15 U/L (ref 15–41)
Albumin: 3.9 g/dL (ref 3.5–5.0)
Alkaline Phosphatase: 60 U/L (ref 38–126)
Anion gap: 8 (ref 5–15)
BUN: 42 mg/dL — ABNORMAL HIGH (ref 8–23)
CO2: 19 mmol/L — ABNORMAL LOW (ref 22–32)
Calcium: 8.8 mg/dL — ABNORMAL LOW (ref 8.9–10.3)
Chloride: 108 mmol/L (ref 98–111)
Creatinine, Ser: 4.35 mg/dL — ABNORMAL HIGH (ref 0.44–1.00)
GFR calc Af Amer: 12 mL/min — ABNORMAL LOW (ref >60)
GFR calc non Af Amer: 10 mL/min — ABNORMAL LOW (ref >60)
Glucose, Bld: 126 mg/dL — ABNORMAL HIGH (ref 70–99)
Potassium: 5 mmol/L (ref 3.5–5.1)
Sodium: 135 mmol/L (ref 135–145)
Total Bilirubin: 0.4 mg/dL (ref 0.3–1.2)
Total Protein: 7.2 g/dL (ref 6.5–8.1)

## 2019-06-03 LAB — CBC WITH DIFFERENTIAL/PLATELET
Abs Immature Granulocytes: 0.03 10*3/uL (ref 0.00–0.07)
Basophils Absolute: 0 10*3/uL (ref 0.0–0.1)
Basophils Relative: 1 %
Eosinophils Absolute: 0.1 10*3/uL (ref 0.0–0.5)
Eosinophils Relative: 2 %
HCT: 25.2 % — ABNORMAL LOW (ref 36.0–46.0)
Hemoglobin: 8.3 g/dL — ABNORMAL LOW (ref 12.0–15.0)
Immature Granulocytes: 1 %
Lymphocytes Relative: 20 %
Lymphs Abs: 1.1 10*3/uL (ref 0.7–4.0)
MCH: 32 pg (ref 26.0–34.0)
MCHC: 32.9 g/dL (ref 30.0–36.0)
MCV: 97.3 fL (ref 80.0–100.0)
Monocytes Absolute: 0.6 10*3/uL (ref 0.1–1.0)
Monocytes Relative: 10 %
Neutro Abs: 3.7 10*3/uL (ref 1.7–7.7)
Neutrophils Relative %: 66 %
Platelets: 226 10*3/uL (ref 150–400)
RBC: 2.59 MIL/uL — ABNORMAL LOW (ref 3.87–5.11)
RDW: 14.3 % (ref 11.5–15.5)
WBC: 5.6 10*3/uL (ref 4.0–10.5)
nRBC: 0 % (ref 0.0–0.2)

## 2019-06-03 MED ORDER — PROCHLORPERAZINE MALEATE 10 MG PO TABS
10.0000 mg | ORAL_TABLET | Freq: Once | ORAL | Status: AC
  Administered 2019-06-03: 10 mg via ORAL
  Filled 2019-06-03: qty 1

## 2019-06-03 MED ORDER — HEPARIN SOD (PORK) LOCK FLUSH 100 UNIT/ML IV SOLN
500.0000 [IU] | Freq: Once | INTRAVENOUS | Status: AC | PRN
  Administered 2019-06-03: 500 [IU]
  Filled 2019-06-03: qty 5

## 2019-06-03 MED ORDER — SODIUM CHLORIDE 0.9% FLUSH
10.0000 mL | Freq: Once | INTRAVENOUS | Status: AC
  Administered 2019-06-03: 10 mL via INTRAVENOUS
  Filled 2019-06-03: qty 10

## 2019-06-03 MED ORDER — SODIUM CHLORIDE 0.9 % IV SOLN
Freq: Once | INTRAVENOUS | Status: AC
  Administered 2019-06-03: 09:00:00 via INTRAVENOUS
  Filled 2019-06-03: qty 250

## 2019-06-03 MED ORDER — SODIUM CHLORIDE 0.9 % IV SOLN
2.0000 mg | Freq: Once | INTRAVENOUS | Status: AC
  Administered 2019-06-03: 2 mg via INTRAVENOUS
  Filled 2019-06-03: qty 4

## 2019-06-03 NOTE — Progress Notes (Signed)
Cr 4.35, ok to treat per MD

## 2019-06-04 LAB — CANCER ANTIGEN 27.29: CA 27.29: 85.6 U/mL — ABNORMAL HIGH (ref 0.0–38.6)

## 2019-06-06 ENCOUNTER — Other Ambulatory Visit: Payer: Self-pay

## 2019-06-06 ENCOUNTER — Ambulatory Visit
Admission: RE | Admit: 2019-06-06 | Discharge: 2019-06-06 | Disposition: A | Discharge status: Home / Self Care | Payer: Medicare HMO | Source: Ambulatory Visit | Attending: Radiation Oncology | Admitting: Radiation Oncology

## 2019-06-06 DIAGNOSIS — C50212 Malignant neoplasm of upper-inner quadrant of left female breast: Secondary | ICD-10-CM | POA: Insufficient documentation

## 2019-06-06 DIAGNOSIS — Z510 Encounter for antineoplastic radiation therapy: Secondary | ICD-10-CM | POA: Diagnosis not present

## 2019-06-06 DIAGNOSIS — Z170 Estrogen receptor positive status [ER+]: Secondary | ICD-10-CM | POA: Insufficient documentation

## 2019-06-06 DIAGNOSIS — C7951 Secondary malignant neoplasm of bone: Secondary | ICD-10-CM | POA: Insufficient documentation

## 2019-06-06 NOTE — Progress Notes (Signed)
Hematology/Oncology Consult note [facility]  Telephone:([phone] [phone]  Patient Care Team: [name], [name], MD as Medical Oncologist (Medical Oncology) Corey Skains, MD as Consulting Physician (Cardiology)   Name of the patient: [name]  [id number]  [birth year]   Date of visit: 06/06/19  Diagnosis-stage IV breast cancer on eribulin  Chief complaint/ Reason for visit-on treatment assessment prior to cycle 1 day 8 of eribulin  Heme/Onc history:  Oncology History Overview Note  # OCT 2015-STAGE IV LEFT BREAST T2N1 [T=4cm; [name]; [name]; [name]; EBUS- Positive Paratrac/subcarinal LN s/p ? Taxotere [in [place]; [name]; [name];[name]; [name]; suv 3]; [name]; [name]; [name];  [name]; Ant R iliac bone; [name]; [name]; START-Taxol weekly. March 2020- Taxol every 2 weeks [PN]; SEP 2020- PET progression  # SEP 9/25/200- ERIBULIN   # Poorly controlled Blood sugars- improved.   # Pancreatitis Hx/PEI- on creon in past / CKD IV [creat ~ 3-4; [name]; [name]; mild left sided weakness]  # Jan 2020-  Lobular lesion on tongue- s/p excision pyogenic granuloma [Dr.McQueen]   # GENETIC TESTING/COUNSELLING: HETEROZYGOUS Cystic Fibrosis Gene [explains hx of recurrent pancreatitis]  # MOLECULAR TESTING: NA  ------------------------------------------------   DIAGNOSIS: [ 0109]  BREAST CA; [name]; [name] ;GOALS: Palliative  CURRENT/MOST RECENT THERAPY: ERIBULIN.     Carcinoma of upper-inner quadrant of left breast in female, estrogen receptor positive ([place])  04/29/2019 -  Chemotherapy   The patient had eriBULin mesylate (HALAVEN) 2 mg in sodium chloride 0.9 % 100 mL chemo infusion, 2 mg, Intravenous,  Once, 1 of 4 cycles Dose modification: 1 mg/m2 (original dose 1 mg/m2, Cycle 1, Reason: Provider Judgment) Administration: 2 mg (06/03/2019), 2 mg (04/29/2019)  for chemotherapy treatment.       Interval history-patient last received cycle 1 day 1 of eribulin on 04/29/2019.  When she was seen on 05/20/2019 she was having ongoing thumb abscess/infection and hence her chemo was on hold.  She subsequently saw [facility] and underwent partial amputation of the left thumb and also completed her antibiotic course and is doing well from that standpoint.  Denies any fever  ECOG PS- 1 Pain scale- 0   Review of systems- Review of Systems  Constitutional: Positive for malaise/fatigue. Negative for chills, fever and weight loss.  HENT: Negative for congestion, ear discharge and nosebleeds.   Eyes: Negative for blurred vision.  Respiratory: Negative for cough, hemoptysis, sputum production, [name], palpitations, orthopnea and claudication.  Gastrointestinal: Negative for abdominal pain, blood in stool, constipation, diarrhea, heartburn, melena, nausea and vomiting.  Genitourinary: Negative for dysuria, flank pain, frequency, hematuria and urgency.  Musculoskeletal: Negative for back pain, joint pain and myalgias.  Skin: Negative for rash.  Neurological: Negative for dizziness, tingling, focal weakness, seizures, weakness and headaches.  Endo/Heme/Allergies: Does not bruise/bleed easily.  Psychiatric/Behavioral: Negative for depression and suicidal ideas. The patient does  not have  insomnia.       No Known Allergies   Past Medical History:  Diagnosis Date  . Anemia   . Anxiety   . Asthma   . Cancer ([place]) 03/10/2018   Per [facility] PET order. Carcinoma of upper-inner quadrant of left breast in female, estrogen receptor positive .  Marland Kitchen Cancer (HCC)    LUNG  . CHF (congestive heart failure) ([place]) 1997  . CKD (chronic kidney disease)   . Depression   . Diabetes mellitus, type 2 ([place])   . Family history of breast cancer   . Family history of colon cancer   . Family history of ovarian cancer   . Family history of pancreatic cancer   . Family history of prostate cancer   . Family history of stomach cancer   . GERD (gastroesophageal reflux disease)    history of an ulcer  . Hair loss   . History of left breast cancer 05/29/14  . History of partial hysterectomy 12/31/2016   Per patient.  Has not had a period in years.  Had a partial hysterectomy years ago.  Marland Kitchen Hypertension   . Mitral valve regurgitation   . Neuromuscular disorder (HCC)    neuropathies in hand  . Obesity   . Pancreatitis 1997  . Stroke [facility]) 2010   with mild left arm weakness     Past Surgical History:  Procedure Laterality Date  . CATARACT EXTRACTION W/PHACO Right 02/24/2019   Procedure: CATARACT EXTRACTION PHACO AND INTRAOCULAR LENS PLACEMENT ([place]) RIGHT DIABETES;  Surgeon: Marchia Meiers, MD;  Location: [facility] ORS;  Service: Ophthalmology;  Laterality: Right;  [place] 01:13.0 CDE 7.96 Fluid Pack Lot # [id number]  . CATARACT EXTRACTION W/PHACO Left 03/24/2019   Procedure: CATARACT EXTRACTION PHACO AND INTRAOCULAR LENS PLACEMENT ([facility]) - left diabetic;  Surgeon: Marchia Meiers, MD;  Location: [facility] ORS;  Service: Ophthalmology;  Laterality: Left;  [place]  01:36 CDE 13.93 Fluid pack lot # [id number]  . CESAREAN SECTION    . CHOLECYSTECTOMY    . EXCISION OF TONGUE LESION N/A 08/17/2018   Procedure: EXCISION OF TONGUE LESION WITH FROZEN SECTIONS;  Surgeon: Beverly Gust, MD;  Location: [facility] ORS;  Service: ENT;   Laterality: N/A;  . EYE SURGERY Right    cataract extraction  . PARTIAL HYSTERECTOMY  12/31/2016   Per patient, she has not had a period in years since she had a partial hysterectomy.  Marland Kitchen PORTA CATH INSERTION    . TUBAL LIGATION      Social History   Socioeconomic History  . Marital status: Single    Spouse name: S.O.. .... keith  . Number of children: Not on file  . Years of education: Not on file  . Highest education level: Not on file  Occupational History    Comment: disabled  Social Needs  . Financial resource strain: Not on file  . Food insecurity    Worry: Not on file    Inability: Not on file  . Transportation needs    Medical: Not on file    Non-medical: Not on file  Tobacco Use  . Smoking status: Former Smoker    Packs/day: 0.50    Years: 1.00    Pack years: 0.50    Types: Cigarettes  . Smokeless tobacco: Never Used  Substance and Sexual Activity  . Alcohol use: No    Alcohol/week: 0.0 standard drinks  . Drug use: No  . Sexual activity: Not on file    Comment: quit 8  years ago  Lifestyle  . Physical activity    Days per week: Not on file    Minutes per session: Not on file  . Stress: Not on file  Relationships  . Social Herbalist on phone: Not on file    Gets together: Not on file    Attends religious service: Not on file    Active member of club or organization: Not on file    Attends meetings of clubs or organizations: Not on file    Relationship status: Not on file  . Intimate partner violence    Fear of current or ex partner: Not on file    Emotionally abused: Not on file    Physically abused: Not on file    Forced sexual activity: Not on file  Other Topics Concern  . Not on file  Social History Narrative  . Not on file    Family History  Problem Relation Age of Onset  . Ovarian cancer Mother 11  . Diabetes Mother   . Hypertension Mother   . COPD Father   . Hypertension Father   . Colon cancer Father 63  . Diabetes Sister    . Breast cancer Sister 24       bilateral  . Diabetes Brother   . Leukemia Maternal Aunt   . Pancreatic cancer Paternal Aunt 101  . Pancreatic cancer Paternal Uncle   . Colon cancer Paternal Uncle   . Stomach cancer Maternal Grandfather 60  . Throat cancer Paternal Grandmother   . Breast cancer Maternal Aunt 80  . Colon cancer Maternal Aunt   . Bone cancer Maternal Aunt   . Breast cancer Paternal Aunt        dx >50  . Prostate cancer Paternal Uncle   . Pancreatic cancer Paternal Uncle   . Throat cancer Paternal Uncle   . Lung cancer Paternal Uncle   . Stomach cancer Paternal Uncle   . Brain cancer Paternal Aunt   . Cancer Cousin        liver, kidney  . Prostate cancer Cousin        meastatic  . Lung cancer Other      Current Outpatient Medications:  .  acetaminophen-codeine (TYLENOL #4) 300-60 MG tablet, Take 1-2 tablets by mouth every 4 (four) hours as needed for moderate pain. , Disp: , Rfl:  .  albuterol (PROAIR HFA) 108 (90 BASE) MCG/ACT inhaler, Inhale 2 puffs into the lungs every 6 (six) hours as needed for wheezing or shortness of breath. , Disp: , Rfl:  .  albuterol (PROVENTIL) (2.5 MG/3ML) 0.083% nebulizer solution, Inhale 2.5 mg into the lungs every 6 (six) hours as needed for shortness of breath. , Disp: , Rfl:  .  ALPRAZolam (XANAX) 0.5 MG tablet, Take 0.5 mg by mouth at bedtime as needed for anxiety or sleep. , Disp: , Rfl:  .  amLODipine (NORVASC) 10 MG tablet, Take 10 mg by mouth daily. , Disp: , Rfl:  .  amoxicillin-clavulanate (AUGMENTIN) 875-125 MG tablet, Take 1 tablet by mouth 2 (two) times daily., Disp: 20 tablet, Rfl: 0 .  aspirin EC 81 MG tablet, Take 81 mg by mouth daily. , Disp: , Rfl:  .  atenolol (TENORMIN) 100 MG tablet, Take 100 mg by mouth 2 (two) times daily. , Disp: , Rfl:  .  B-D ULTRA-FINE 33 LANCETS MISC, Use 1 each 2 (two) times daily., Disp: , Rfl:  .  B-D ULTRAFINE III SHORT PEN  31G X 8 MM MISC, , Disp: , Rfl:  .  bumetanide (BUMEX) 0.5  MG tablet, Take 0.5 mg by mouth 2 (two) times daily. , Disp: , Rfl:  .  calcitRIOL (ROCALTROL) 0.25 MCG capsule, Take 0.25 mcg by mouth every Monday, Wednesday, and Friday. , Disp: , Rfl:  .  Cinnamon 500 MG capsule, Take 1,000 mg by mouth daily. , Disp: , Rfl:  .  cloNIDine (CATAPRES) 0.2 MG tablet, Take 0.2 mg by mouth 2 (two) times daily. , Disp: , Rfl:  .  enalapril (VASOTEC) 10 MG tablet, Take 20 mg by mouth 2 (two) times a day. , Disp: , Rfl:  .  famotidine (PEPCID) 20 MG tablet, Take 20 mg by mouth 2 (two) times daily., Disp: , Rfl:  .  ferrous sulfate 325 (65 FE) MG tablet, Take 325 mg by mouth 2 (two) times daily with a meal., Disp: , Rfl:  .  FLUoxetine (PROZAC) 20 MG capsule, Take 20 mg by mouth 2 (two) times daily. , Disp: , Rfl:  .  Fluticasone Propionate, Inhal, (FLOVENT DISKUS) 100 MCG/BLIST AEPB, Inhale 2 puffs into the lungs 2 (two) times a day. Inhale 2 inhalations into the lungs 2 times daily PRN , Disp: , Rfl:  .  gabapentin (NEURONTIN) 100 MG capsule, TAKE 1 CAPSULE(100 MG) BY MOUTH AT BEDTIME (Patient taking differently: Take 100 mg by mouth at bedtime. ), Disp: 30 capsule, Rfl: 6 .  glucose blood (ONE TOUCH ULTRA TEST) test strip, Use 1 each 2 (two) times daily. Use as instructed., Disp: , Rfl:  .  glyBURIDE (DIABETA) 5 MG tablet, Take 10 mg by mouth 2 (two) times daily with a meal. , Disp: , Rfl:  .  influenza vac split quadrivalent PF (AFLURIA QUADRIVALENT) 0.5 ML injection, Inject 60 mcg as directed 1 day or 1 dose., Disp: , Rfl:  .  LEVEMIR FLEXTOUCH 100 UNIT/ML Pen, Inject 55 Units into the skin daily. , Disp: , Rfl:  .  loperamide (IMODIUM A-D) 2 MG capsule, Take 2-4 mg by mouth 4 (four) times daily as needed for diarrhea or loose stools. , Disp: , Rfl:  .  mometasone (NASONEX) 50 MCG/ACT nasal spray, Place 2 sprays into the nose daily as needed (Allergies). , Disp: , Rfl:  .  NOVOLOG FLEXPEN 100 UNIT/ML FlexPen, Inject 7 Units into the skin 2 (two) times daily. , Disp: ,  Rfl:  .  ondansetron (ZOFRAN) 8 MG tablet, One pill every 8 hours as needed for nausea/vomitting. (Patient taking differently: Take 8 mg by mouth every 8 (eight) hours as needed for nausea or vomiting. ), Disp: 40 tablet, Rfl: 1 .  prednisoLONE acetate (PRED FORTE) 1 % ophthalmic suspension, Place 1 drop into the left eye 2 (two) times daily., Disp: , Rfl:  .  prochlorperazine (COMPAZINE) 10 MG tablet, Take 1 tablet (10 mg total) by mouth every 6 (six) hours as needed for nausea or vomiting. Please note change in strength (Patient taking differently: Take 10 mg by mouth every 6 (six) hours as needed for nausea or vomiting. ), Disp: 60 tablet, Rfl: 4 .  salmeterol (SEREVENT) 50 MCG/DOSE diskus inhaler, Inhale 1 puff into the lungs daily as needed (shortness of breath). , Disp: , Rfl:  .  simvastatin (ZOCOR) 20 MG tablet, Take 20 mg by mouth daily at 6 PM. , Disp: , Rfl:  .  vitamin B-12 (CYANOCOBALAMIN) 1000 MCG tablet, Take 1,000 mcg by mouth daily., Disp: , Rfl:  No current  facility-administered medications for this visit.   Facility-Administered Medications Ordered in Other Visits:  .  sodium chloride flush (NS) 0.9 % injection 10 mL, 10 mL, Intravenous, PRN, Cammie Sickle, MD, 10 mL at 01/30/16 1054  Physical exam:  Vitals:   06/03/19 0852  BP: (!) 147/86  Pulse: 70  Temp: (!) 96.2 F (35.7 C)  TempSrc: Tympanic  Weight: 201 lb (91.2 kg)  Height: 5\' 3"  (1.6 m)   Physical Exam HENT:     Head: Normocephalic and atraumatic.  Eyes:     Pupils: Pupils are equal, round, and reactive to light.  Neck:     Musculoskeletal: Normal range of motion.  Cardiovascular:     Rate and Rhythm: Normal rate and regular rhythm.     Heart sounds: Normal heart sounds.  Pulmonary:     Effort: Pulmonary effort is normal.     Breath sounds: Normal breath sounds.  Abdominal:     General: Bowel sounds are normal.     Palpations: Abdomen is soft.  Musculoskeletal:     Comments: Ace wrap in place  over the right thumb  Skin:    General: Skin is warm and dry.  Neurological:     Mental Status: She is alert and oriented to person, place, and time.      CMP Latest Ref Rng & Units 06/03/2019  Glucose 70 - 99 mg/dL 126(H)  BUN 8 - 23 mg/dL 42(H)  Creatinine 0.44 - 1.00 mg/dL 4.35(H)  Sodium 135 - 145 mmol/L 135  Potassium 3.5 - 5.1 mmol/L 5.0  Chloride 98 - 111 mmol/L 108  CO2 22 - 32 mmol/L 19(L)  Calcium 8.9 - 10.3 mg/dL 8.8(L)  Total Protein 6.5 - 8.1 g/dL 7.2  Total Bilirubin 0.3 - 1.2 mg/dL 0.4  Alkaline Phos 38 - 126 U/L 60  AST 15 - 41 U/L 15  ALT 0 - 44 U/L 9   CBC Latest Ref Rng & Units 06/03/2019  WBC 4.0 - 10.5 K/uL 5.6  Hemoglobin 12.0 - 15.0 g/dL 8.3(L)  Hematocrit 36.0 - 46.0 % 25.2(L)  Platelets 150 - 400 K/uL 226      Assessment and plan- Patient is a 63 y.o. female with stage IV breast cancer here for on treatment assessment prior to cycle 1 day 8 of eribulin  1.  Cycle 1 day 8 of eribulin was delayed because of right thumb infection s/p partial amputation and patient is currently healing well from that standpoint.  She will proceed with cycle 1 day 8 of eribulin today  2.  Stage IV CKD: Her creatinine continues to be elevated at 4.3 today.  Her value on 05/20/2019 was also 4 and prior to that her creatinine has been between 2.6-3.  I will touch base with Dr. Rogue Bussing upon his return.  Patient also follows up with nephrology.  Her eribulin is currently at 1 mg per metered squared which is acceptable for her degree of renal dysfunction.  I will give her extra fluids 1 L over 1 hour today.  3.  Patient will be starting palliative radiation to her right acetabulum next week for 5 days.  She may benefit from bisphosphonates down the line.  She will see Dr. Rogue Bussing in 1 week's time for possible cycle 1 day 15 of eribulin based on her anemia and renal dysfunction  4.  Anemia: Likely multifactorial secondary to chemotherapy and/or CKD.  Continue to monitor    Visit Diagnosis 1. Stage 4 chronic kidney disease ([place])  2. Metastasis from malignant tumor of breast ([place])   3. Anemia due to antineoplastic chemotherapy   4. Encounter for antineoplastic chemotherapy      Dr. Randa Evens, MD, MPH [facility] at [facility] [phone] 06/06/2019 8:18 AM

## 2019-06-08 ENCOUNTER — Ambulatory Visit
Admission: RE | Admit: 2019-06-08 | Discharge: 2019-06-08 | Disposition: A | Discharge status: Home / Self Care | Payer: Medicare HMO | Source: Ambulatory Visit | Attending: Radiation Oncology | Admitting: Radiation Oncology

## 2019-06-08 ENCOUNTER — Other Ambulatory Visit: Payer: Self-pay

## 2019-06-08 DIAGNOSIS — C7951 Secondary malignant neoplasm of bone: Secondary | ICD-10-CM | POA: Diagnosis not present

## 2019-06-08 DIAGNOSIS — Z510 Encounter for antineoplastic radiation therapy: Secondary | ICD-10-CM | POA: Diagnosis not present

## 2019-06-08 DIAGNOSIS — Z170 Estrogen receptor positive status [ER+]: Secondary | ICD-10-CM | POA: Diagnosis not present

## 2019-06-08 DIAGNOSIS — C50212 Malignant neoplasm of upper-inner quadrant of left female breast: Secondary | ICD-10-CM | POA: Diagnosis not present

## 2019-06-08 DIAGNOSIS — G8918 Other acute postprocedural pain: Secondary | ICD-10-CM | POA: Diagnosis not present

## 2019-06-09 ENCOUNTER — Other Ambulatory Visit: Payer: Self-pay

## 2019-06-09 ENCOUNTER — Ambulatory Visit
Admission: RE | Admit: 2019-06-09 | Discharge: 2019-06-09 | Disposition: A | Discharge status: Home / Self Care | Payer: Medicare HMO | Source: Ambulatory Visit | Attending: Radiation Oncology | Admitting: Radiation Oncology

## 2019-06-09 DIAGNOSIS — Z170 Estrogen receptor positive status [ER+]: Secondary | ICD-10-CM | POA: Diagnosis not present

## 2019-06-09 DIAGNOSIS — C7951 Secondary malignant neoplasm of bone: Secondary | ICD-10-CM | POA: Diagnosis not present

## 2019-06-09 DIAGNOSIS — G8918 Other acute postprocedural pain: Secondary | ICD-10-CM | POA: Diagnosis not present

## 2019-06-09 DIAGNOSIS — C50212 Malignant neoplasm of upper-inner quadrant of left female breast: Secondary | ICD-10-CM | POA: Diagnosis not present

## 2019-06-09 DIAGNOSIS — Z510 Encounter for antineoplastic radiation therapy: Secondary | ICD-10-CM | POA: Diagnosis not present

## 2019-06-10 ENCOUNTER — Inpatient Hospital Stay: Payer: Medicare HMO

## 2019-06-10 ENCOUNTER — Other Ambulatory Visit: Payer: Self-pay

## 2019-06-10 ENCOUNTER — Ambulatory Visit
Admission: RE | Admit: 2019-06-10 | Discharge: 2019-06-10 | Disposition: A | Discharge status: Home / Self Care | Payer: Medicare HMO | Source: Ambulatory Visit | Attending: Radiation Oncology | Admitting: Radiation Oncology

## 2019-06-10 ENCOUNTER — Inpatient Hospital Stay: Payer: Medicare HMO | Attending: Internal Medicine | Admitting: Internal Medicine

## 2019-06-10 DIAGNOSIS — D649 Anemia, unspecified: Secondary | ICD-10-CM | POA: Diagnosis not present

## 2019-06-10 DIAGNOSIS — C50212 Malignant neoplasm of upper-inner quadrant of left female breast: Secondary | ICD-10-CM

## 2019-06-10 DIAGNOSIS — Z95828 Presence of other vascular implants and grafts: Secondary | ICD-10-CM

## 2019-06-10 DIAGNOSIS — R600 Localized edema: Secondary | ICD-10-CM | POA: Diagnosis not present

## 2019-06-10 DIAGNOSIS — Z8041 Family history of malignant neoplasm of ovary: Secondary | ICD-10-CM | POA: Diagnosis not present

## 2019-06-10 DIAGNOSIS — I129 Hypertensive chronic kidney disease with stage 1 through stage 4 chronic kidney disease, or unspecified chronic kidney disease: Secondary | ICD-10-CM | POA: Insufficient documentation

## 2019-06-10 DIAGNOSIS — Z5111 Encounter for antineoplastic chemotherapy: Secondary | ICD-10-CM | POA: Insufficient documentation

## 2019-06-10 DIAGNOSIS — Z79899 Other long term (current) drug therapy: Secondary | ICD-10-CM | POA: Diagnosis not present

## 2019-06-10 DIAGNOSIS — F418 Other specified anxiety disorders: Secondary | ICD-10-CM | POA: Diagnosis not present

## 2019-06-10 DIAGNOSIS — Z7951 Long term (current) use of inhaled steroids: Secondary | ICD-10-CM | POA: Diagnosis not present

## 2019-06-10 DIAGNOSIS — Z7189 Other specified counseling: Secondary | ICD-10-CM

## 2019-06-10 DIAGNOSIS — N184 Chronic kidney disease, stage 4 (severe): Secondary | ICD-10-CM | POA: Diagnosis not present

## 2019-06-10 DIAGNOSIS — Z170 Estrogen receptor positive status [ER+]: Secondary | ICD-10-CM | POA: Diagnosis not present

## 2019-06-10 DIAGNOSIS — C7951 Secondary malignant neoplasm of bone: Secondary | ICD-10-CM | POA: Diagnosis not present

## 2019-06-10 DIAGNOSIS — R5381 Other malaise: Secondary | ICD-10-CM | POA: Diagnosis not present

## 2019-06-10 DIAGNOSIS — R5383 Other fatigue: Secondary | ICD-10-CM | POA: Diagnosis not present

## 2019-06-10 DIAGNOSIS — Z7982 Long term (current) use of aspirin: Secondary | ICD-10-CM | POA: Diagnosis not present

## 2019-06-10 DIAGNOSIS — Z87891 Personal history of nicotine dependence: Secondary | ICD-10-CM | POA: Diagnosis not present

## 2019-06-10 DIAGNOSIS — Z800 Family history of malignant neoplasm of digestive organs: Secondary | ICD-10-CM | POA: Diagnosis not present

## 2019-06-10 DIAGNOSIS — Z510 Encounter for antineoplastic radiation therapy: Secondary | ICD-10-CM | POA: Diagnosis not present

## 2019-06-10 DIAGNOSIS — E1122 Type 2 diabetes mellitus with diabetic chronic kidney disease: Secondary | ICD-10-CM | POA: Diagnosis not present

## 2019-06-10 DIAGNOSIS — Z792 Long term (current) use of antibiotics: Secondary | ICD-10-CM | POA: Insufficient documentation

## 2019-06-10 DIAGNOSIS — C50919 Malignant neoplasm of unspecified site of unspecified female breast: Secondary | ICD-10-CM

## 2019-06-10 DIAGNOSIS — L02511 Cutaneous abscess of right hand: Secondary | ICD-10-CM | POA: Diagnosis not present

## 2019-06-10 DIAGNOSIS — Z7984 Long term (current) use of oral hypoglycemic drugs: Secondary | ICD-10-CM | POA: Insufficient documentation

## 2019-06-10 DIAGNOSIS — Z803 Family history of malignant neoplasm of breast: Secondary | ICD-10-CM | POA: Diagnosis not present

## 2019-06-10 LAB — COMPREHENSIVE METABOLIC PANEL WITH GFR
ALT: 17 U/L (ref 0–44)
AST: 17 U/L (ref 15–41)
Albumin: 3.6 g/dL (ref 3.5–5.0)
Alkaline Phosphatase: 60 U/L (ref 38–126)
Anion gap: 7 (ref 5–15)
BUN: 28 mg/dL — ABNORMAL HIGH (ref 8–23)
CO2: 19 mmol/L — ABNORMAL LOW (ref 22–32)
Calcium: 8.2 mg/dL — ABNORMAL LOW (ref 8.9–10.3)
Chloride: 106 mmol/L (ref 98–111)
Creatinine, Ser: 3.01 mg/dL — ABNORMAL HIGH (ref 0.44–1.00)
GFR calc Af Amer: 18 mL/min — ABNORMAL LOW
GFR calc non Af Amer: 16 mL/min — ABNORMAL LOW
Glucose, Bld: 203 mg/dL — ABNORMAL HIGH (ref 70–99)
Potassium: 4.5 mmol/L (ref 3.5–5.1)
Sodium: 132 mmol/L — ABNORMAL LOW (ref 135–145)
Total Bilirubin: 0.5 mg/dL (ref 0.3–1.2)
Total Protein: 7 g/dL (ref 6.5–8.1)

## 2019-06-10 LAB — CBC WITH DIFFERENTIAL/PLATELET
Abs Immature Granulocytes: 0.08 10*3/uL — ABNORMAL HIGH (ref 0.00–0.07)
Basophils Absolute: 0 10*3/uL (ref 0.0–0.1)
Basophils Relative: 1 %
Eosinophils Absolute: 0.1 10*3/uL (ref 0.0–0.5)
Eosinophils Relative: 1 %
HCT: 24.4 % — ABNORMAL LOW (ref 36.0–46.0)
Hemoglobin: 8.2 g/dL — ABNORMAL LOW (ref 12.0–15.0)
Immature Granulocytes: 1 %
Lymphocytes Relative: 18 %
Lymphs Abs: 1.1 10*3/uL (ref 0.7–4.0)
MCH: 32 pg (ref 26.0–34.0)
MCHC: 33.6 g/dL (ref 30.0–36.0)
MCV: 95.3 fL (ref 80.0–100.0)
Monocytes Absolute: 0.3 10*3/uL (ref 0.1–1.0)
Monocytes Relative: 5 %
Neutro Abs: 4.4 10*3/uL (ref 1.7–7.7)
Neutrophils Relative %: 74 %
Platelets: 219 10*3/uL (ref 150–400)
RBC: 2.56 MIL/uL — ABNORMAL LOW (ref 3.87–5.11)
RDW: 13.7 % (ref 11.5–15.5)
WBC: 6 10*3/uL (ref 4.0–10.5)
nRBC: 0 % (ref 0.0–0.2)

## 2019-06-10 MED ORDER — PROCHLORPERAZINE MALEATE 10 MG PO TABS
10.0000 mg | ORAL_TABLET | Freq: Once | ORAL | Status: AC
  Administered 2019-06-10: 10 mg via ORAL
  Filled 2019-06-10: qty 1

## 2019-06-10 MED ORDER — SODIUM CHLORIDE 0.9 % IV SOLN
2.0000 mg | Freq: Once | INTRAVENOUS | Status: AC
  Administered 2019-06-10: 11:00:00 2 mg via INTRAVENOUS
  Filled 2019-06-10: qty 4

## 2019-06-10 MED ORDER — SODIUM CHLORIDE 0.9 % IV SOLN
Freq: Once | INTRAVENOUS | Status: AC
  Administered 2019-06-10: 11:00:00 via INTRAVENOUS
  Filled 2019-06-10: qty 250

## 2019-06-10 MED ORDER — SODIUM CHLORIDE 0.9% FLUSH
10.0000 mL | Freq: Once | INTRAVENOUS | Status: AC
  Administered 2019-06-10: 09:00:00 10 mL via INTRAVENOUS
  Filled 2019-06-10: qty 10

## 2019-06-10 MED ORDER — HEPARIN SOD (PORK) LOCK FLUSH 100 UNIT/ML IV SOLN
500.0000 [IU] | Freq: Once | INTRAVENOUS | Status: AC | PRN
  Administered 2019-06-10: 12:00:00 500 [IU]
  Filled 2019-06-10: qty 5

## 2019-06-10 NOTE — Assessment & Plan Note (Addendum)
Left breast cancer- stage IV- ER/PR +, HER-2/neu.  PET scan April 12, 2019-mild progressive disease ; increased uptake pleural-based lesion/lung lesions; intense uptake right acetabular region otherwise stable bone lesions/left breast mass; tumor marker rising.  Currently on eribulin [renally dosed [start at 1 mg/m] started on-04/29/2019.   #Proceed with cycle number 2-day 8 eribulin. Labs today reviewed;  acceptable for treatment today.   # Bone mets-sclerotic; Right acetabular uptake-currently undergoing radiation. Hypocalcemia-ca-8.3; check vit D levels at next visit.   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 Followed closely by nephrology.   # Anemia- hemoglobin today-8.7; again ? infection/renal function slightly worse.   # Right thumb paronychia/abscess [s/p I&D; [facility]]; poorly controlled diabetes.  Improving.  # Patient wants to go to [facility] week of 13th Nov-until after Thanksgiving; counseled the patient regarding risk of thromboembolic events during travel.  # DISPOSITION:  # Eribilin today # follow up on NOV 30th- labs/MD-cbc/cmp-ca-27-29; Eribulin - Dr.B

## 2019-06-10 NOTE — Progress Notes (Signed)
I connected with [name] on 06/10/2019 at  9:15 AM EST by video enabled telemedicine visit and verified that I am speaking with the correct person using two identifiers.  I discussed the limitations, risks, security and privacy concerns of performing an evaluation and management service by telemedicine and the availability of in-person appointments. I also discussed with the patient that there may be a patient responsible charge related to this service. The patient expressed understanding and agreed to proceed.    Other persons participating in the visit and their role in the encounter: RN/medical reconciliation Patient's location: office Provider's location: Home  Oncology History Overview Note  # OCT 2015-STAGE IV LEFT BREAST T2N1 [T=4cm; [name]; [name]; [name]; EBUS- Positive Paratrac/subcarinal LN s/p ? Taxotere [in [place]; [name]; [name];[name]; [name]; suv 3]; [name]; [name]; [name];  [name]; Ant R iliac bone; [name]; [name]; START-Taxol weekly. March 2020- Taxol every 2 weeks [PN]; SEP 2020- PET progression  # SEP 9/25/200- ERIBULIN   # Poorly controlled Blood sugars- improved.   # Pancreatitis Hx/PEI- on creon in past / CKD IV [creat ~ 3-4; [name]; [name]; mild left sided weakness]  # Jan 2020-  Lobular lesion on tongue- s/p excision pyogenic granuloma [Dr.McQueen]   # GENETIC TESTING/COUNSELLING: HETEROZYGOUS Cystic Fibrosis Gene [explains hx of recurrent  pancreatitis]  # MOLECULAR TESTING: NA  ------------------------------------------------   DIAGNOSIS: [ 3016] BREAST CA; [name]; [name] ;GOALS: Palliative  CURRENT/MOST RECENT THERAPY: ERIBULIN.     Carcinoma of upper-inner quadrant of left breast in female, estrogen receptor positive ([place])  04/29/2019 -  Chemotherapy   The patient had eriBULin mesylate (HALAVEN) 2 mg in sodium chloride 0.9 % 100 mL chemo infusion, 2 mg, Intravenous,  Once, 2 of 4 cycles Dose modification: 1 mg/m2 (original dose 1 mg/m2, Cycle 1, Reason: Provider Judgment) Administration: 2 mg (06/03/2019), 2 mg (04/29/2019), 2 mg (06/10/2019)  for chemotherapy treatment.       Chief Complaint: Metastatic breast cancer   History of present illness:[name] 63 y.o.  female with history of metastatic breast cancer ER/PR positive currently on eribulin is here for follow-up.  In the interim patient was also evaluated by radiation oncology she is currently undergoing radiation to right hip.  She feels the hip discomfort is improving.  Patient continues to follow-up with orthopedics for her right thumb abscess/status post incision drainage.  Patient states she is healing well.  She continues to been antibiotics.  She has a follow-up with me next week.  Otherwise no nausea no vomiting.  She is planned to go to [facility] next week for Thanksgiving weekend with the family.  Observation/objective: See below  Assessment and plan: Carcinoma of upper-inner quadrant of left breast in female, estrogen receptor positive ([place]) Left breast cancer- stage IV- ER/PR +, HER-2/neu.  PET scan April 12, 2019-mild progressive disease ; increased uptake pleural-based lesion/lung lesions; intense uptake right acetabular region otherwise stable bone lesions/left breast mass; tumor marker rising.  Currently on eribulin [renally dosed [start  at 1 mg/m] started on-04/29/2019.   #Proceed with cycle number 2-day 8  eribulin. Labs today reviewed;  acceptable for treatment today.   # Bone mets-sclerotic; Right acetabular uptake-currently undergoing radiation. Hypocalcemia-ca-8.3; check vit D levels at next visit.   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 Followed closely by nephrology.   # Anemia- hemoglobin today-8.7; again ? infection/renal function slightly worse.   # Right thumb paronychia/abscess [s/p I&D; [facility]]; poorly controlled diabetes.  Improving.  # Patient wants to go to [facility] week of 13th Nov-until after Thanksgiving; counseled the patient regarding risk of thromboembolic events during travel.  # DISPOSITION:  # Eribilin today # follow up on NOV 30th- labs/MD-cbc/cmp-ca-27-29; Eribulin - Dr.B  Follow-up instructions:  I discussed the assessment and treatment plan with the patient.  The patient was provided an opportunity to ask questions and all were answered.  The patient agreed with the plan and demonstrated understanding of instructions.  The patient was advised to call back or seek an in person evaluation if the symptoms worsen or if the condition fails to improve as anticipated.  Dr. Charlaine Dalton [place] at [facility] 06/12/2019 5:11 PM

## 2019-06-13 ENCOUNTER — Other Ambulatory Visit: Payer: Self-pay

## 2019-06-13 ENCOUNTER — Ambulatory Visit
Admission: RE | Admit: 2019-06-13 | Discharge: 2019-06-13 | Disposition: A | Discharge status: Home / Self Care | Payer: Medicare HMO | Source: Ambulatory Visit | Attending: Radiation Oncology | Admitting: Radiation Oncology

## 2019-06-13 DIAGNOSIS — C50212 Malignant neoplasm of upper-inner quadrant of left female breast: Secondary | ICD-10-CM | POA: Diagnosis not present

## 2019-06-13 DIAGNOSIS — Z170 Estrogen receptor positive status [ER+]: Secondary | ICD-10-CM | POA: Diagnosis not present

## 2019-06-13 DIAGNOSIS — Z510 Encounter for antineoplastic radiation therapy: Secondary | ICD-10-CM | POA: Diagnosis not present

## 2019-06-13 DIAGNOSIS — C7951 Secondary malignant neoplasm of bone: Secondary | ICD-10-CM | POA: Diagnosis not present

## 2019-06-14 ENCOUNTER — Other Ambulatory Visit: Payer: Self-pay

## 2019-06-14 ENCOUNTER — Ambulatory Visit
Admission: RE | Admit: 2019-06-14 | Discharge: 2019-06-14 | Disposition: A | Discharge status: Home / Self Care | Payer: Medicare HMO | Source: Ambulatory Visit | Attending: Radiation Oncology | Admitting: Radiation Oncology

## 2019-06-14 DIAGNOSIS — Z170 Estrogen receptor positive status [ER+]: Secondary | ICD-10-CM | POA: Diagnosis not present

## 2019-06-14 DIAGNOSIS — Z510 Encounter for antineoplastic radiation therapy: Secondary | ICD-10-CM | POA: Diagnosis not present

## 2019-06-14 DIAGNOSIS — C7951 Secondary malignant neoplasm of bone: Secondary | ICD-10-CM | POA: Diagnosis not present

## 2019-06-14 DIAGNOSIS — C50212 Malignant neoplasm of upper-inner quadrant of left female breast: Secondary | ICD-10-CM | POA: Diagnosis not present

## 2019-07-04 ENCOUNTER — Other Ambulatory Visit: Payer: Self-pay

## 2019-07-04 ENCOUNTER — Inpatient Hospital Stay: Payer: Medicare HMO

## 2019-07-04 ENCOUNTER — Inpatient Hospital Stay (HOSPITAL_BASED_OUTPATIENT_CLINIC_OR_DEPARTMENT_OTHER): Payer: Medicare HMO | Admitting: Internal Medicine

## 2019-07-04 DIAGNOSIS — Z170 Estrogen receptor positive status [ER+]: Secondary | ICD-10-CM

## 2019-07-04 DIAGNOSIS — C50212 Malignant neoplasm of upper-inner quadrant of left female breast: Secondary | ICD-10-CM | POA: Diagnosis not present

## 2019-07-04 DIAGNOSIS — Z5111 Encounter for antineoplastic chemotherapy: Secondary | ICD-10-CM | POA: Diagnosis not present

## 2019-07-04 DIAGNOSIS — L02511 Cutaneous abscess of right hand: Secondary | ICD-10-CM | POA: Diagnosis not present

## 2019-07-04 DIAGNOSIS — C7951 Secondary malignant neoplasm of bone: Secondary | ICD-10-CM | POA: Diagnosis not present

## 2019-07-04 DIAGNOSIS — D649 Anemia, unspecified: Secondary | ICD-10-CM | POA: Diagnosis not present

## 2019-07-04 DIAGNOSIS — R600 Localized edema: Secondary | ICD-10-CM | POA: Diagnosis not present

## 2019-07-04 DIAGNOSIS — Z792 Long term (current) use of antibiotics: Secondary | ICD-10-CM | POA: Diagnosis not present

## 2019-07-04 DIAGNOSIS — Z7189 Other specified counseling: Secondary | ICD-10-CM

## 2019-07-04 LAB — CBC WITH DIFFERENTIAL/PLATELET
Abs Immature Granulocytes: 0.11 10*3/uL — ABNORMAL HIGH (ref 0.00–0.07)
Basophils Absolute: 0 10*3/uL (ref 0.0–0.1)
Basophils Relative: 0 %
Eosinophils Absolute: 0.1 10*3/uL (ref 0.0–0.5)
Eosinophils Relative: 1 %
HCT: 28.1 % — ABNORMAL LOW (ref 36.0–46.0)
Hemoglobin: 9.4 g/dL — ABNORMAL LOW (ref 12.0–15.0)
Immature Granulocytes: 1 %
Lymphocytes Relative: 13 %
Lymphs Abs: 1.1 10*3/uL (ref 0.7–4.0)
MCH: 32.5 pg (ref 26.0–34.0)
MCHC: 33.5 g/dL (ref 30.0–36.0)
MCV: 97.2 fL (ref 80.0–100.0)
Monocytes Absolute: 1 10*3/uL (ref 0.1–1.0)
Monocytes Relative: 12 %
Neutro Abs: 6.2 10*3/uL (ref 1.7–7.7)
Neutrophils Relative %: 73 %
Platelets: 251 10*3/uL (ref 150–400)
RBC: 2.89 MIL/uL — ABNORMAL LOW (ref 3.87–5.11)
RDW: 13.6 % (ref 11.5–15.5)
WBC: 8.5 10*3/uL (ref 4.0–10.5)
nRBC: 0 % (ref 0.0–0.2)

## 2019-07-04 LAB — COMPREHENSIVE METABOLIC PANEL
ALT: 13 U/L (ref 0–44)
AST: 14 U/L — ABNORMAL LOW (ref 15–41)
Albumin: 3.9 g/dL (ref 3.5–5.0)
Alkaline Phosphatase: 61 U/L (ref 38–126)
Anion gap: 7 (ref 5–15)
BUN: 35 mg/dL — ABNORMAL HIGH (ref 8–23)
CO2: 22 mmol/L (ref 22–32)
Calcium: 8.2 mg/dL — ABNORMAL LOW (ref 8.9–10.3)
Chloride: 104 mmol/L (ref 98–111)
Creatinine, Ser: 2.84 mg/dL — ABNORMAL HIGH (ref 0.44–1.00)
GFR calc Af Amer: 20 mL/min — ABNORMAL LOW (ref >60)
GFR calc non Af Amer: 17 mL/min — ABNORMAL LOW (ref >60)
Glucose, Bld: 183 mg/dL — ABNORMAL HIGH (ref 70–99)
Potassium: 4.3 mmol/L (ref 3.5–5.1)
Sodium: 133 mmol/L — ABNORMAL LOW (ref 135–145)
Total Bilirubin: 0.5 mg/dL (ref 0.3–1.2)
Total Protein: 7.2 g/dL (ref 6.5–8.1)

## 2019-07-04 LAB — VITAMIN D 25 HYDROXY (VIT D DEFICIENCY, FRACTURES): Vit D, 25-Hydroxy: 9.66 ng/mL — ABNORMAL LOW (ref 30–100)

## 2019-07-04 MED ORDER — PROCHLORPERAZINE MALEATE 10 MG PO TABS
10.0000 mg | ORAL_TABLET | Freq: Once | ORAL | Status: AC
  Administered 2019-07-04: 10 mg via ORAL
  Filled 2019-07-04: qty 1

## 2019-07-04 MED ORDER — HEPARIN SOD (PORK) LOCK FLUSH 100 UNIT/ML IV SOLN
500.0000 [IU] | Freq: Once | INTRAVENOUS | Status: AC
  Administered 2019-07-04: 500 [IU] via INTRAVENOUS

## 2019-07-04 MED ORDER — SODIUM CHLORIDE 0.9 % IV SOLN
Freq: Once | INTRAVENOUS | Status: AC
  Administered 2019-07-04: 10:00:00 via INTRAVENOUS
  Filled 2019-07-04: qty 250

## 2019-07-04 MED ORDER — HEPARIN SOD (PORK) LOCK FLUSH 100 UNIT/ML IV SOLN
INTRAVENOUS | Status: AC
  Filled 2019-07-04: qty 5

## 2019-07-04 MED ORDER — SODIUM CHLORIDE 0.9 % IV SOLN
0.9600 mg/m2 | Freq: Once | INTRAVENOUS | Status: AC
  Administered 2019-07-04: 2 mg via INTRAVENOUS
  Filled 2019-07-04: qty 4

## 2019-07-04 NOTE — Progress Notes (Signed)
Per MD treatment conditions, okay to proceed with Creatinine of 2.84.

## 2019-07-04 NOTE — Assessment & Plan Note (Addendum)
Left breast cancer- stage IV- ER/PR +, HER-2/neu.  PET scan April 12, 2019-mild progressive disease ; increased uptake pleural-based lesion/lung lesions; intense uptake right acetabular region otherwise stable bone lesions/left breast mass; tumor marker rising.  Currently on eribulin [renally dosed [start at 1 mg/m] started on-04/29/2019.   #Proceed with cycle number 3-day 1 eribulin. Labs today reviewed;  acceptable for treatment today.   # Bone mets-sclerotic; Right acetabular uptake-s/p radiation. Hypocalcemia-ca-8.3; check vit D levels today.   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   # Bil LE swelling/ededma MILD- discussed re: compression stocking/ leg- STABLE.   # Chronic kidney disease - stage IV-stable followed by nephrology.  # Anemia- hemoglobin today-9.2; STABLE.   # Right thumb paronychia/abscess [s/p I&D; [facility]]; poorly controlled diabetes.  STABLE.   # DISPOSITION:  # Eribilin today # 1 week- labs-cbc/bmp- Eribulin # follow up on 3 weeks- labs/MD-cbc/cmp-ca-27-29; Eribulin - Dr.B

## 2019-07-04 NOTE — Progress Notes (Signed)
[place] OFFICE PROGRESS NOTE  Patient Care Team: [name], [name], MD as Medical Oncologist (Medical Oncology) Corey Skains, MD as Consulting Physician (Cardiology)  Cancer Staging No matching staging information was found for the patient.   Oncology History Overview Note  # OCT 2015-STAGE IV LEFT BREAST T2N1 [T=4cm; [name]; [name]; [name]; EBUS- Positive Paratrac/subcarinal LN s/p ? Taxotere [in [place]; [name]; [name];[name]; [name]; suv 3]; [name]; [name]; [name];  [name]; Ant R iliac bone; [name]; [name]; START-Taxol weekly. March 2020- Taxol every 2 weeks [PN]; SEP 2020- PET progression  # SEP 9/25/200- ERIBULIN   # Poorly controlled Blood sugars- improved.   # Pancreatitis Hx/PEI- on creon in past / CKD IV [creat ~ 3-4; [name]; [name]; mild left sided weakness]  # Jan 2020-  Lobular lesion on tongue- s/p excision pyogenic granuloma [Dr.McQueen]   # GENETIC TESTING/COUNSELLING: HETEROZYGOUS Cystic Fibrosis Gene [explains hx of recurrent pancreatitis]  # MOLECULAR TESTING: NA  ------------------------------------------------   DIAGNOSIS: [ 0960] BREAST CA; [name]; [name] ;GOALS: Palliative  CURRENT/MOST RECENT THERAPY: ERIBULIN.     Carcinoma of upper-inner quadrant of left breast in female, estrogen receptor positive ([place])  04/29/2019 -  Chemotherapy   The patient  had eriBULin mesylate (HALAVEN) 2 mg in sodium chloride 0.9 % 100 mL chemo infusion, 2 mg, Intravenous,  Once, 3 of 4 cycles Dose modification: 1 mg/m2 (original dose 1 mg/m2, Cycle 1, Reason: Provider Judgment) Administration: 2 mg (06/03/2019), 2 mg (04/29/2019), 2 mg (06/10/2019)  for chemotherapy treatment.     INTERVAL HISTORY:  [name] 63 y.o.  female pleasant patient above history of metastatic ER PR positive HER-2 negative breast cancer-recently started on eribulin given the progression of disease is here for follow-up.  Patient's eribulin therapy has been interrupted because of right thumb abscess/paronychia; also recent travel to [facility] for Thanksgiving.  She is currently off antibiotics.  Right thumb wound healing.   Review of Systems  Constitutional: Positive for malaise/fatigue. Negative for chills, diaphoresis, [name], hemoptysis, sputum production, shortness of breath and wheezing.   Cardiovascular: Positive for leg swelling. Negative for chest pain, palpitations and orthopnea.  Gastrointestinal: Negative for abdominal pain, blood in stool, constipation, diarrhea, heartburn, melena, nausea and vomiting.  Genitourinary: Negative for dysuria, frequency and urgency.  Musculoskeletal: Negative for back pain and joint pain.  Skin: Negative.  Negative for itching and rash.  Neurological: Positive for tingling. Negative for dizziness, focal weakness, weakness and headaches.  Endo/Heme/Allergies: Does not bruise/bleed easily.  Psychiatric/Behavioral: Negative for depression. The patient is not nervous/anxious and does not have insomnia.     PAST MEDICAL HISTORY :  Past Medical History:  Diagnosis Date  . Anemia   . Anxiety   . Asthma   . Cancer ([place]) 03/10/2018   Per [facility] PET order.  Carcinoma of upper-inner quadrant of left breast in  female, estrogen receptor positive .  Marland Kitchen Cancer (HCC)    LUNG  . CHF (congestive heart failure) ([place]) 1997  . CKD (chronic kidney disease)   . Depression   . Diabetes mellitus, type 2 ([place])   . Family history of breast cancer   . Family history of colon cancer   . Family history of ovarian cancer   . Family history of pancreatic cancer   . Family history of prostate cancer   . Family history of stomach cancer   . GERD (gastroesophageal reflux disease)    history of an ulcer  . Hair loss   . History of left breast cancer 05/29/14  . History of partial hysterectomy 12/31/2016   Per patient.  Has not had a period in years.  Had a partial hysterectomy years ago.  Marland Kitchen Hypertension   . Mitral valve regurgitation   . Neuromuscular disorder (HCC)    neuropathies in hand  . Obesity   . Pancreatitis 1997  . Stroke [facility]) 2010   with mild left arm weakness    PAST SURGICAL HISTORY :   Past Surgical History:  Procedure Laterality Date  . CATARACT EXTRACTION W/PHACO Right 02/24/2019   Procedure: CATARACT EXTRACTION PHACO AND INTRAOCULAR LENS PLACEMENT ([place]) RIGHT DIABETES;  Surgeon: Marchia Meiers, MD;  Location: [facility] ORS;  Service: Ophthalmology;  Laterality: Right;  [place] 01:13.0 CDE 7.96 Fluid Pack Lot # [id number]  . CATARACT EXTRACTION W/PHACO Left 03/24/2019   Procedure: CATARACT EXTRACTION PHACO AND INTRAOCULAR LENS PLACEMENT ([facility]) - left diabetic;  Surgeon: Marchia Meiers, MD;  Location: [facility] ORS;  Service: Ophthalmology;  Laterality: Left;  [place]  01:36 CDE 13.93 Fluid pack lot # [id number]  . CESAREAN SECTION    . CHOLECYSTECTOMY    . EXCISION OF TONGUE LESION N/A 08/17/2018   Procedure: EXCISION OF TONGUE LESION WITH FROZEN SECTIONS;  Surgeon: Beverly Gust, MD;  Location: [facility] ORS;  Service: ENT;  Laterality: N/A;  . EYE SURGERY Right    cataract extraction  . PARTIAL HYSTERECTOMY  12/31/2016   Per patient, she has not had a period in years since she had a partial hysterectomy.  Marland Kitchen  PORTA CATH INSERTION    . TUBAL LIGATION      FAMILY HISTORY :   Family History  Problem Relation Age of Onset  . Ovarian cancer Mother 45  . Diabetes Mother   . Hypertension Mother   . COPD Father   . Hypertension Father   . Colon cancer Father 110  . Diabetes Sister   . Breast cancer Sister 17       bilateral  . Diabetes Brother   . Leukemia Maternal Aunt   . Pancreatic cancer Paternal Aunt 23  . Pancreatic cancer Paternal Uncle   . Colon cancer Paternal Uncle   . Stomach cancer Maternal Grandfather 43  . Throat cancer Paternal Grandmother   . Breast cancer Maternal Aunt 80  . Colon cancer Maternal Aunt   . Bone cancer Maternal Aunt   . Breast cancer Paternal Aunt        dx >50  . Prostate cancer Paternal Uncle   . Pancreatic cancer Paternal Uncle   . Throat cancer Paternal Uncle   . Lung cancer Paternal Uncle   . Stomach cancer Paternal Uncle   . Brain cancer Paternal Aunt   . Cancer Cousin        liver, kidney  . Prostate cancer Cousin  meastatic  . Lung cancer Other     SOCIAL HISTORY:   Social History   Tobacco Use  . Smoking status: Former Smoker    Packs/day: 0.50    Years: 1.00    Pack years: 0.50    Types: Cigarettes  . Smokeless tobacco: Never Used  Substance Use Topics  . Alcohol use: No    Alcohol/week: 0.0 standard drinks  . Drug use: No    ALLERGIES:  has No Known Allergies.  MEDICATIONS:  Current Outpatient Medications  Medication Sig Dispense Refill  . acetaminophen-codeine (TYLENOL #4) 300-60 MG tablet Take 1-2 tablets by mouth every 4 (four) hours as needed for moderate pain.     Marland Kitchen albuterol (PROAIR HFA) 108 (90 BASE) MCG/ACT inhaler Inhale 2 puffs into the lungs every 6 (six) hours as needed for wheezing or shortness of breath.     Marland Kitchen albuterol (PROVENTIL) (2.5 MG/3ML) 0.083% nebulizer solution Inhale 2.5 mg into the lungs every 6 (six) hours as needed for shortness of breath.     . ALPRAZolam (XANAX) 0.5 MG tablet Take 0.5 mg  by mouth at bedtime as needed for anxiety or sleep.     Marland Kitchen amLODipine (NORVASC) 10 MG tablet Take 10 mg by mouth daily.     Marland Kitchen amoxicillin-clavulanate (AUGMENTIN) 875-125 MG tablet Take 1 tablet by mouth 2 (two) times daily. 20 tablet 0  . aspirin EC 81 MG tablet Take 81 mg by mouth daily.     Marland Kitchen atenolol (TENORMIN) 100 MG tablet Take 100 mg by mouth 2 (two) times daily.     . B-D ULTRA-FINE 33 LANCETS MISC Use 1 each 2 (two) times daily.    . B-D ULTRAFINE III SHORT PEN 31G X 8 MM MISC     . bumetanide (BUMEX) 0.5 MG tablet Take 0.5 mg by mouth 2 (two) times daily.     . calcitRIOL (ROCALTROL) 0.25 MCG capsule Take 0.25 mcg by mouth every Monday, Wednesday, and Friday.     . Cinnamon 500 MG capsule Take 1,000 mg by mouth daily.     . cloNIDine (CATAPRES) 0.2 MG tablet Take 0.2 mg by mouth 2 (two) times daily.     . enalapril (VASOTEC) 10 MG tablet Take 20 mg by mouth 2 (two) times a day.     . famotidine (PEPCID) 20 MG tablet Take 20 mg by mouth 2 (two) times daily.    . ferrous sulfate 325 (65 FE) MG tablet Take 325 mg by mouth 2 (two) times daily with a meal.    . FLUoxetine (PROZAC) 20 MG capsule Take 20 mg by mouth 2 (two) times daily.     . Fluticasone Propionate, Inhal, (FLOVENT DISKUS) 100 MCG/BLIST AEPB Inhale 2 puffs into the lungs 2 (two) times a day. Inhale 2 inhalations into the lungs 2 times daily PRN     . gabapentin (NEURONTIN) 100 MG capsule TAKE 1 CAPSULE(100 MG) BY MOUTH AT BEDTIME (Patient taking differently: Take 100 mg by mouth at bedtime. ) 30 capsule 6  . glucose blood (ONE TOUCH ULTRA TEST) test strip Use 1 each 2 (two) times daily. Use as instructed.    . glyBURIDE (DIABETA) 5 MG tablet Take 10 mg by mouth 2 (two) times daily with a meal.     . LEVEMIR FLEXTOUCH 100 UNIT/ML Pen Inject 55 Units into the skin daily.     Marland Kitchen loperamide (IMODIUM A-D) 2 MG capsule Take 2-4 mg by mouth 4 (four) times daily as needed for  diarrhea or loose stools.     . mometasone (NASONEX) 50  MCG/ACT nasal spray Place 2 sprays into the nose daily as needed (Allergies).     . NOVOLOG FLEXPEN 100 UNIT/ML FlexPen Inject 7 Units into the skin 2 (two) times daily.     . ondansetron (ZOFRAN) 8 MG tablet One pill every 8 hours as needed for nausea/vomitting. (Patient taking differently: Take 8 mg by mouth every 8 (eight) hours as needed for nausea or vomiting. ) 40 tablet 1  . prednisoLONE acetate (PRED FORTE) 1 % ophthalmic suspension Place 1 drop into the left eye 2 (two) times daily.    . prochlorperazine (COMPAZINE) 10 MG tablet Take 1 tablet (10 mg total) by mouth every 6 (six) hours as needed for nausea or vomiting. Please note change in strength (Patient taking differently: Take 10 mg by mouth every 6 (six) hours as needed for nausea or vomiting. ) 60 tablet 4  . salmeterol (SEREVENT) 50 MCG/DOSE diskus inhaler Inhale 1 puff into the lungs daily as needed (shortness of breath).     . simvastatin (ZOCOR) 20 MG tablet Take 20 mg by mouth daily at 6 PM.     . vitamin B-12 (CYANOCOBALAMIN) 1000 MCG tablet Take 1,000 mcg by mouth daily.     No current facility-administered medications for this visit.    Facility-Administered Medications Ordered in Other Visits  Medication Dose Route Frequency Provider Last Rate Last Dose  . 0.9 %  sodium chloride infusion   Intravenous Once Charlaine Dalton R, MD      . eriBULin mesylate (HALAVEN) 2 mg in sodium chloride 0.9 % 100 mL chemo infusion  0.96 mg/m2 (Treatment Plan Recorded) Intravenous Once Cammie Sickle, MD      . prochlorperazine (COMPAZINE) tablet 10 mg  10 mg Oral Once Charlaine Dalton R, MD      . sodium chloride flush (NS) 0.9 % injection 10 mL  10 mL Intravenous PRN Cammie Sickle, MD   10 mL at 01/30/16 1054    PHYSICAL EXAMINATION: ECOG PERFORMANCE STATUS: 1 - Symptomatic but completely ambulatory  BP (!) 146/86 (BP Location: Right Arm, Patient Position: Sitting, Cuff Size: Large)   Pulse 68   Temp (!) 96.8 F  (36 C) (Tympanic)   Wt 203 lb (92.1 kg)   BMI 35.96 kg/m   Filed Weights   07/04/19 0842  Weight: 203 lb (92.1 kg)    Physical Exam  Constitutional: She is oriented to person, place, and time and well-developed, well-nourished, and in no distress.  She is alone.  HENT:  Head: Normocephalic and atraumatic.  Mouth/Throat: Oropharynx is clear and moist. No oropharyngeal exudate.  Eyes: Pupils are equal, round, and reactive to light.  Neck: Normal range of motion. Neck supple.  Cardiovascular: Normal rate and regular rhythm.  Pulmonary/Chest: Breath sounds normal. No respiratory distress. She has no wheezes.  Abdominal: Soft. Bowel sounds are normal. She exhibits no distension and no mass. There is no abdominal tenderness. There is no rebound and no guarding.  Musculoskeletal: Normal range of motion.        General: Edema present. No tenderness.  Neurological: She is alert and oriented to person, place, and time.  Skin: Skin is warm.  Right thumb bandaged-wound healing.  No signs of infection or pus.  Psychiatric: Affect normal.    LABORATORY DATA:  I have reviewed the data as listed    Component Value Date/Time   NA 133 (L) 07/04/2019 0815  NA 130 (L) 06/06/2014 1102   K 4.3 07/04/2019 0815   K 3.9 06/06/2014 1102   CL 104 07/04/2019 0815   CL 95 (L) 06/06/2014 1102   CO2 22 07/04/2019 0815   CO2 28 06/06/2014 1102   GLUCOSE 183 (H) 07/04/2019 0815   GLUCOSE 349 (H) 06/06/2014 1102   BUN 35 (H) 07/04/2019 0815   BUN 17 06/06/2014 1102   CREATININE 2.84 (H) 07/04/2019 0815   CREATININE 1.63 (H) 06/06/2014 1102   CALCIUM 8.2 (L) 07/04/2019 0815   CALCIUM 9.2 06/06/2014 1102   PROT 7.2 07/04/2019 0815   PROT 8.2 06/06/2014 1102   ALBUMIN 3.9 07/04/2019 0815   ALBUMIN 3.3 (L) 06/06/2014 1102   AST 14 (L) 07/04/2019 0815   AST 7 (L) 06/06/2014 1102   ALT 13 07/04/2019 0815   ALT 12 (L) 06/06/2014 1102   ALKPHOS 61 07/04/2019 0815   ALKPHOS 74 06/06/2014 1102    BILITOT 0.5 07/04/2019 0815   BILITOT 0.4 06/06/2014 1102   GFRNONAA 17 (L) 07/04/2019 0815   GFRNONAA 35 (L) 06/06/2014 1102   GFRAA 20 (L) 07/04/2019 0815   GFRAA 42 (L) 06/06/2014 1102    No results found for: SPEP, UPEP  Lab Results  Component Value Date   WBC 8.5 07/04/2019   NEUTROABS 6.2 07/04/2019   HGB 9.4 (L) 07/04/2019   HCT 28.1 (L) 07/04/2019   MCV 97.2 07/04/2019   PLT 251 07/04/2019      Chemistry      Component Value Date/Time   NA 133 (L) 07/04/2019 0815   NA 130 (L) 06/06/2014 1102   K 4.3 07/04/2019 0815   K 3.9 06/06/2014 1102   CL 104 07/04/2019 0815   CL 95 (L) 06/06/2014 1102   CO2 22 07/04/2019 0815   CO2 28 06/06/2014 1102   BUN 35 (H) 07/04/2019 0815   BUN 17 06/06/2014 1102   CREATININE 2.84 (H) 07/04/2019 0815   CREATININE 1.63 (H) 06/06/2014 1102      Component Value Date/Time   CALCIUM 8.2 (L) 07/04/2019 0815   CALCIUM 9.2 06/06/2014 1102   ALKPHOS 61 07/04/2019 0815   ALKPHOS 74 06/06/2014 1102   AST 14 (L) 07/04/2019 0815   AST 7 (L) 06/06/2014 1102   ALT 13 07/04/2019 0815   ALT 12 (L) 06/06/2014 1102   BILITOT 0.5 07/04/2019 0815   BILITOT 0.4 06/06/2014 1102       RADIOGRAPHIC STUDIES: I have personally reviewed the radiological images as listed and agreed with the findings in the report. No results found.   ASSESSMENT & PLAN:  Carcinoma of upper-inner quadrant of left breast in female, estrogen receptor positive ([place]) Left breast cancer- stage IV- ER/PR +, HER-2/neu.  PET scan April 12, 2019-mild progressive disease ; increased uptake pleural-based lesion/lung lesions; intense uptake right acetabular region otherwise stable bone lesions/left breast mass; tumor marker rising.  Currently on eribulin [renally dosed [start at 1 mg/m] started on-04/29/2019.   #Proceed with cycle number 3-day 1 eribulin. Labs today reviewed;  acceptable for treatment today.   # Bone mets-sclerotic; Right acetabular uptake-s/p radiation.  Hypocalcemia-ca-8.3; check vit D levels today.   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   # Bil LE swelling/ededma MILD- discussed re: compression stocking/ leg- STABLE.   # Chronic kidney disease - stage IV-stable followed by nephrology.  # Anemia- hemoglobin today-9.2; STABLE.   # Right thumb paronychia/abscess [s/p I&D; [facility]]; poorly controlled diabetes.  STABLE.   #  DISPOSITION:  # Eribilin today # 1 week- labs-cbc/bmp- Eribulin # follow up on 3 weeks- labs/MD-cbc/cmp-ca-27-29; Eribulin - Dr.B   No orders of the defined types were placed in this encounter.  All questions were answered. The patient knows to call the clinic with any problems, questions or concerns.      Cammie Sickle, MD 07/04/2019 10:13 AM

## 2019-07-05 LAB — CANCER ANTIGEN 27.29: CA 27.29: 94.8 U/mL — ABNORMAL HIGH (ref 0.0–38.6)

## 2019-07-11 ENCOUNTER — Inpatient Hospital Stay: Payer: Medicare HMO

## 2019-07-11 ENCOUNTER — Inpatient Hospital Stay: Payer: Medicare HMO | Attending: Internal Medicine

## 2019-07-11 ENCOUNTER — Other Ambulatory Visit: Payer: Self-pay

## 2019-07-11 VITALS — BP 124/71 | HR 68 | Temp 97.0°F | Resp 20

## 2019-07-11 DIAGNOSIS — Z170 Estrogen receptor positive status [ER+]: Secondary | ICD-10-CM | POA: Insufficient documentation

## 2019-07-11 DIAGNOSIS — R600 Localized edema: Secondary | ICD-10-CM | POA: Insufficient documentation

## 2019-07-11 DIAGNOSIS — Z7982 Long term (current) use of aspirin: Secondary | ICD-10-CM | POA: Insufficient documentation

## 2019-07-11 DIAGNOSIS — I509 Heart failure, unspecified: Secondary | ICD-10-CM | POA: Diagnosis not present

## 2019-07-11 DIAGNOSIS — N184 Chronic kidney disease, stage 4 (severe): Secondary | ICD-10-CM | POA: Insufficient documentation

## 2019-07-11 DIAGNOSIS — Z7951 Long term (current) use of inhaled steroids: Secondary | ICD-10-CM | POA: Diagnosis not present

## 2019-07-11 DIAGNOSIS — F418 Other specified anxiety disorders: Secondary | ICD-10-CM | POA: Diagnosis not present

## 2019-07-11 DIAGNOSIS — C50212 Malignant neoplasm of upper-inner quadrant of left female breast: Secondary | ICD-10-CM | POA: Insufficient documentation

## 2019-07-11 DIAGNOSIS — Z8041 Family history of malignant neoplasm of ovary: Secondary | ICD-10-CM | POA: Insufficient documentation

## 2019-07-11 DIAGNOSIS — R5381 Other malaise: Secondary | ICD-10-CM | POA: Insufficient documentation

## 2019-07-11 DIAGNOSIS — D649 Anemia, unspecified: Secondary | ICD-10-CM | POA: Insufficient documentation

## 2019-07-11 DIAGNOSIS — Z79899 Other long term (current) drug therapy: Secondary | ICD-10-CM | POA: Insufficient documentation

## 2019-07-11 DIAGNOSIS — Z7189 Other specified counseling: Secondary | ICD-10-CM

## 2019-07-11 DIAGNOSIS — Z800 Family history of malignant neoplasm of digestive organs: Secondary | ICD-10-CM | POA: Insufficient documentation

## 2019-07-11 DIAGNOSIS — I130 Hypertensive heart and chronic kidney disease with heart failure and stage 1 through stage 4 chronic kidney disease, or unspecified chronic kidney disease: Secondary | ICD-10-CM | POA: Diagnosis not present

## 2019-07-11 DIAGNOSIS — E1122 Type 2 diabetes mellitus with diabetic chronic kidney disease: Secondary | ICD-10-CM | POA: Insufficient documentation

## 2019-07-11 DIAGNOSIS — C7951 Secondary malignant neoplasm of bone: Secondary | ICD-10-CM | POA: Diagnosis not present

## 2019-07-11 DIAGNOSIS — R5383 Other fatigue: Secondary | ICD-10-CM | POA: Diagnosis not present

## 2019-07-11 DIAGNOSIS — Z794 Long term (current) use of insulin: Secondary | ICD-10-CM | POA: Insufficient documentation

## 2019-07-11 DIAGNOSIS — Z5111 Encounter for antineoplastic chemotherapy: Secondary | ICD-10-CM | POA: Insufficient documentation

## 2019-07-11 DIAGNOSIS — Z95828 Presence of other vascular implants and grafts: Secondary | ICD-10-CM

## 2019-07-11 DIAGNOSIS — Z803 Family history of malignant neoplasm of breast: Secondary | ICD-10-CM | POA: Diagnosis not present

## 2019-07-11 LAB — COMPREHENSIVE METABOLIC PANEL
ALT: 14 U/L (ref 0–44)
AST: 15 U/L (ref 15–41)
Albumin: 3.7 g/dL (ref 3.5–5.0)
Alkaline Phosphatase: 59 U/L (ref 38–126)
Anion gap: 7 (ref 5–15)
BUN: 39 mg/dL — ABNORMAL HIGH (ref 8–23)
CO2: 23 mmol/L (ref 22–32)
Calcium: 8.4 mg/dL — ABNORMAL LOW (ref 8.9–10.3)
Chloride: 105 mmol/L (ref 98–111)
Creatinine, Ser: 3.62 mg/dL — ABNORMAL HIGH (ref 0.44–1.00)
GFR calc Af Amer: 15 mL/min — ABNORMAL LOW (ref >60)
GFR calc non Af Amer: 13 mL/min — ABNORMAL LOW (ref >60)
Glucose, Bld: 151 mg/dL — ABNORMAL HIGH (ref 70–99)
Potassium: 4 mmol/L (ref 3.5–5.1)
Sodium: 135 mmol/L (ref 135–145)
Total Bilirubin: 0.6 mg/dL (ref 0.3–1.2)
Total Protein: 7.1 g/dL (ref 6.5–8.1)

## 2019-07-11 LAB — CBC WITH DIFFERENTIAL/PLATELET
Abs Immature Granulocytes: 0.08 10*3/uL — ABNORMAL HIGH (ref 0.00–0.07)
Basophils Absolute: 0 10*3/uL (ref 0.0–0.1)
Basophils Relative: 1 %
Eosinophils Absolute: 0.1 10*3/uL (ref 0.0–0.5)
Eosinophils Relative: 1 %
HCT: 27.7 % — ABNORMAL LOW (ref 36.0–46.0)
Hemoglobin: 9.3 g/dL — ABNORMAL LOW (ref 12.0–15.0)
Immature Granulocytes: 1 %
Lymphocytes Relative: 16 %
Lymphs Abs: 1 10*3/uL (ref 0.7–4.0)
MCH: 32.3 pg (ref 26.0–34.0)
MCHC: 33.6 g/dL (ref 30.0–36.0)
MCV: 96.2 fL (ref 80.0–100.0)
Monocytes Absolute: 0.3 10*3/uL (ref 0.1–1.0)
Monocytes Relative: 5 %
Neutro Abs: 5.1 10*3/uL (ref 1.7–7.7)
Neutrophils Relative %: 76 %
Platelets: 236 10*3/uL (ref 150–400)
RBC: 2.88 MIL/uL — ABNORMAL LOW (ref 3.87–5.11)
RDW: 13.2 % (ref 11.5–15.5)
WBC: 6.6 10*3/uL (ref 4.0–10.5)
nRBC: 0 % (ref 0.0–0.2)

## 2019-07-11 MED ORDER — HEPARIN SOD (PORK) LOCK FLUSH 100 UNIT/ML IV SOLN
500.0000 [IU] | Freq: Once | INTRAVENOUS | Status: AC | PRN
  Administered 2019-07-11: 12:00:00 500 [IU]

## 2019-07-11 MED ORDER — SODIUM CHLORIDE 0.9 % IV SOLN
Freq: Once | INTRAVENOUS | Status: AC
  Administered 2019-07-11: 10:00:00 via INTRAVENOUS
  Filled 2019-07-11: qty 250

## 2019-07-11 MED ORDER — PROCHLORPERAZINE MALEATE 10 MG PO TABS
10.0000 mg | ORAL_TABLET | Freq: Once | ORAL | Status: AC
  Administered 2019-07-11: 11:00:00 10 mg via ORAL
  Filled 2019-07-11: qty 1

## 2019-07-11 MED ORDER — SODIUM CHLORIDE 0.9 % IV SOLN
2.0000 mg | Freq: Once | INTRAVENOUS | Status: AC
  Administered 2019-07-11: 11:00:00 2 mg via INTRAVENOUS
  Filled 2019-07-11: qty 4

## 2019-07-11 MED ORDER — HEPARIN SOD (PORK) LOCK FLUSH 100 UNIT/ML IV SOLN
INTRAVENOUS | Status: AC
  Filled 2019-07-11: qty 5

## 2019-07-11 MED ORDER — SODIUM CHLORIDE 0.9% FLUSH
10.0000 mL | Freq: Once | INTRAVENOUS | Status: AC
  Administered 2019-07-11: 10 mL via INTRAVENOUS
  Filled 2019-07-11: qty 10

## 2019-07-12 LAB — CANCER ANTIGEN 27.29: CA 27.29: 100.4 U/mL — ABNORMAL HIGH (ref 0.0–38.6)

## 2019-07-15 ENCOUNTER — Ambulatory Visit: Payer: Medicare HMO | Admitting: Radiation Oncology

## 2019-07-19 DIAGNOSIS — M868X4 Other osteomyelitis, hand: Secondary | ICD-10-CM | POA: Diagnosis not present

## 2019-07-22 ENCOUNTER — Other Ambulatory Visit: Payer: Self-pay

## 2019-07-22 ENCOUNTER — Encounter: Payer: Self-pay | Admitting: Radiation Oncology

## 2019-07-22 ENCOUNTER — Encounter: Payer: Self-pay | Admitting: Internal Medicine

## 2019-07-22 ENCOUNTER — Ambulatory Visit
Admission: RE | Admit: 2019-07-22 | Discharge: 2019-07-22 | Disposition: A | Discharge status: Home / Self Care | Payer: Medicare HMO | Source: Ambulatory Visit | Attending: Radiation Oncology | Admitting: Radiation Oncology

## 2019-07-22 DIAGNOSIS — Z79811 Long term (current) use of aromatase inhibitors: Secondary | ICD-10-CM | POA: Insufficient documentation

## 2019-07-22 DIAGNOSIS — Z170 Estrogen receptor positive status [ER+]: Secondary | ICD-10-CM | POA: Diagnosis not present

## 2019-07-22 DIAGNOSIS — C7951 Secondary malignant neoplasm of bone: Secondary | ICD-10-CM

## 2019-07-22 DIAGNOSIS — Z923 Personal history of irradiation: Secondary | ICD-10-CM | POA: Diagnosis not present

## 2019-07-22 DIAGNOSIS — C50212 Malignant neoplasm of upper-inner quadrant of left female breast: Secondary | ICD-10-CM | POA: Diagnosis not present

## 2019-07-22 NOTE — Progress Notes (Signed)
Radiation Oncology Follow up Note  Name: [name]   Date:   07/22/2019 MRN:  [id number] DOB: [birth year]    This 63 y.o. female presents to the clinic today for 1 month follow-up status post palliative radiation therapy to her right acetabulum for metastatic stage IV invasive mammary carcinoma.  REFERRING PROVIDER: [name], MD  HPI: Patient is a 63 year old female now 1 month out status post palliative radiation therapy to her right hip for involvement of stage IV invasive mammary carcinoma.  Seen today in routine follow-up she is doing well she is in no pain she has good mobilization of her right lower extremity extremity and is ambulating well..  She is Currently on eribulin [and tolerating that well. COMPLICATIONS OF TREATMENT: none  FOLLOW UP COMPLIANCE: keeps appointments   PHYSICAL EXAM:  BP (P) 127/81 (BP Location: Left Arm, Patient Position: Sitting)   Pulse (P) 66   Temp (!) (P) 97.1 F (36.2 C) (Tympanic)   Resp (P) 18  Range of motion of her lower extremities does not elicit pain.  Well-developed well-nourished patient in NAD. HEENT reveals PERLA, EOMI, discs not visualized.  Oral cavity is clear. No oral mucosal lesions are identified. Neck is clear without evidence of cervical or supraclavicular adenopathy. Lungs are clear to A&P. Cardiac examination is essentially unremarkable with regular rate and rhythm without murmur rub or thrill. Abdomen is benign with no organomegaly or masses noted. Motor sensory and DTR levels are equal and symmetric in the upper and lower extremities. Cranial nerves II through XII are grossly intact. Proprioception is intact. No peripheral adenopathy or edema is identified. No motor or sensory levels are noted. Crude visual fields are within normal range.  RADIOLOGY RESULTS: No current films to review  PLAN: Present time she is achieved excellent palliation from her her metastatic disease to her acetabulum.  I am going to turn follow-up  care over to medical oncology.  I be happy to reevaluate the patient anytime should further palliative treatment be indicated.  I would like to take this opportunity to thank you for allowing me to participate in the care of your patient.Noreene Filbert, MD

## 2019-07-25 ENCOUNTER — Inpatient Hospital Stay (HOSPITAL_BASED_OUTPATIENT_CLINIC_OR_DEPARTMENT_OTHER): Payer: Medicare HMO | Admitting: Internal Medicine

## 2019-07-25 ENCOUNTER — Other Ambulatory Visit: Payer: Self-pay

## 2019-07-25 ENCOUNTER — Inpatient Hospital Stay: Payer: Medicare HMO

## 2019-07-25 VITALS — Resp 20

## 2019-07-25 DIAGNOSIS — Z7189 Other specified counseling: Secondary | ICD-10-CM

## 2019-07-25 DIAGNOSIS — C50212 Malignant neoplasm of upper-inner quadrant of left female breast: Secondary | ICD-10-CM

## 2019-07-25 DIAGNOSIS — D649 Anemia, unspecified: Secondary | ICD-10-CM | POA: Diagnosis not present

## 2019-07-25 DIAGNOSIS — Z170 Estrogen receptor positive status [ER+]: Secondary | ICD-10-CM

## 2019-07-25 DIAGNOSIS — I130 Hypertensive heart and chronic kidney disease with heart failure and stage 1 through stage 4 chronic kidney disease, or unspecified chronic kidney disease: Secondary | ICD-10-CM | POA: Diagnosis not present

## 2019-07-25 DIAGNOSIS — C7951 Secondary malignant neoplasm of bone: Secondary | ICD-10-CM | POA: Diagnosis not present

## 2019-07-25 DIAGNOSIS — R600 Localized edema: Secondary | ICD-10-CM | POA: Diagnosis not present

## 2019-07-25 DIAGNOSIS — R5381 Other malaise: Secondary | ICD-10-CM | POA: Diagnosis not present

## 2019-07-25 DIAGNOSIS — Z5111 Encounter for antineoplastic chemotherapy: Secondary | ICD-10-CM | POA: Diagnosis not present

## 2019-07-25 DIAGNOSIS — R5383 Other fatigue: Secondary | ICD-10-CM | POA: Diagnosis not present

## 2019-07-25 LAB — CBC WITH DIFFERENTIAL/PLATELET
Abs Immature Granulocytes: 0.18 10*3/uL — ABNORMAL HIGH (ref 0.00–0.07)
Basophils Absolute: 0 10*3/uL (ref 0.0–0.1)
Basophils Relative: 1 %
Eosinophils Absolute: 0 10*3/uL (ref 0.0–0.5)
Eosinophils Relative: 0 %
HCT: 29.4 % — ABNORMAL LOW (ref 36.0–46.0)
Hemoglobin: 9.7 g/dL — ABNORMAL LOW (ref 12.0–15.0)
Immature Granulocytes: 2 %
Lymphocytes Relative: 11 %
Lymphs Abs: 0.9 10*3/uL (ref 0.7–4.0)
MCH: 31.8 pg (ref 26.0–34.0)
MCHC: 33 g/dL (ref 30.0–36.0)
MCV: 96.4 fL (ref 80.0–100.0)
Monocytes Absolute: 1.1 10*3/uL — ABNORMAL HIGH (ref 0.1–1.0)
Monocytes Relative: 13 %
Neutro Abs: 5.9 10*3/uL (ref 1.7–7.7)
Neutrophils Relative %: 73 %
Platelets: 314 10*3/uL (ref 150–400)
RBC: 3.05 MIL/uL — ABNORMAL LOW (ref 3.87–5.11)
RDW: 13.5 % (ref 11.5–15.5)
WBC: 8.1 10*3/uL (ref 4.0–10.5)
nRBC: 0 % (ref 0.0–0.2)

## 2019-07-25 LAB — COMPREHENSIVE METABOLIC PANEL
ALT: 14 U/L (ref 0–44)
AST: 18 U/L (ref 15–41)
Albumin: 3.9 g/dL (ref 3.5–5.0)
Alkaline Phosphatase: 61 U/L (ref 38–126)
Anion gap: 10 (ref 5–15)
BUN: 42 mg/dL — ABNORMAL HIGH (ref 8–23)
CO2: 22 mmol/L (ref 22–32)
Calcium: 8.3 mg/dL — ABNORMAL LOW (ref 8.9–10.3)
Chloride: 102 mmol/L (ref 98–111)
Creatinine, Ser: 3.65 mg/dL — ABNORMAL HIGH (ref 0.44–1.00)
GFR calc Af Amer: 15 mL/min — ABNORMAL LOW (ref >60)
GFR calc non Af Amer: 13 mL/min — ABNORMAL LOW (ref >60)
Glucose, Bld: 203 mg/dL — ABNORMAL HIGH (ref 70–99)
Potassium: 4.1 mmol/L (ref 3.5–5.1)
Sodium: 134 mmol/L — ABNORMAL LOW (ref 135–145)
Total Bilirubin: 0.6 mg/dL (ref 0.3–1.2)
Total Protein: 7.1 g/dL (ref 6.5–8.1)

## 2019-07-25 MED ORDER — HEPARIN SOD (PORK) LOCK FLUSH 100 UNIT/ML IV SOLN
INTRAVENOUS | Status: AC
  Filled 2019-07-25: qty 5

## 2019-07-25 MED ORDER — SODIUM CHLORIDE 0.9 % IV SOLN
Freq: Once | INTRAVENOUS | Status: AC
  Filled 2019-07-25: qty 250

## 2019-07-25 MED ORDER — PROCHLORPERAZINE MALEATE 10 MG PO TABS
10.0000 mg | ORAL_TABLET | Freq: Once | ORAL | Status: AC
  Administered 2019-07-25: 10 mg via ORAL
  Filled 2019-07-25: qty 1

## 2019-07-25 MED ORDER — HEPARIN SOD (PORK) LOCK FLUSH 100 UNIT/ML IV SOLN
500.0000 [IU] | Freq: Once | INTRAVENOUS | Status: AC | PRN
  Administered 2019-07-25: 500 [IU]
  Filled 2019-07-25: qty 5

## 2019-07-25 MED ORDER — SODIUM CHLORIDE 0.9 % IV SOLN
2.0000 mg | Freq: Once | INTRAVENOUS | Status: AC
  Administered 2019-07-25: 2 mg via INTRAVENOUS
  Filled 2019-07-25: qty 4

## 2019-07-25 NOTE — Progress Notes (Signed)
[place] OFFICE PROGRESS NOTE  Patient Care Team: [name], [name], MD as Medical Oncologist (Medical Oncology) Corey Skains, MD as Consulting Physician (Cardiology)  Cancer Staging No matching staging information was found for the patient.   Oncology History Overview Note  # OCT 2015-STAGE IV LEFT BREAST T2N1 [T=4cm; [name]; [name]; [name]; EBUS- Positive Paratrac/subcarinal LN s/p ? Taxotere [in [place]; [name]; [name];[name]; [name]; suv 3]; [name]; [name]; [name];  [name]; Ant R iliac bone; [name]; [name]; START-Taxol weekly. March 2020- Taxol every 2 weeks [PN]; SEP 2020- PET progression  # SEP 9/25/200- ERIBULIN   # Poorly controlled Blood sugars- improved.   # Pancreatitis Hx/PEI- on creon in past / CKD IV [creat ~ 3-4; [name]; [name]; mild left sided weakness]  # Jan 2020-  Lobular lesion on tongue- s/p excision pyogenic granuloma [Dr.McQueen]   # GENETIC TESTING/COUNSELLING: HETEROZYGOUS Cystic Fibrosis Gene [explains hx of recurrent pancreatitis]  # MOLECULAR TESTING: NA  ------------------------------------------------   DIAGNOSIS: [ 2505] BREAST CA; [name]; [name] ;GOALS: Palliative  CURRENT/MOST RECENT THERAPY: ERIBULIN.     Carcinoma of upper-inner quadrant of left breast in female, estrogen receptor positive ([place])  04/29/2019 -  Chemotherapy   The patient  had eriBULin mesylate ([place]) 2 mg in sodium chloride 0.9 % 100 mL chemo infusion, 2 mg, Intravenous,  Once, 4 of 4 cycles Dose modification: 1 mg/m2 (original dose 1 mg/m2, Cycle 1, Reason: Provider Judgment) Administration: 2 mg (06/03/2019), 2 mg (04/29/2019), 2 mg (06/10/2019), 2 mg (07/04/2019), 2 mg (07/11/2019), 2 mg (07/25/2019)  for chemotherapy treatment.     INTERVAL HISTORY:  [name] 63 y.o.  female pleasant patient above history of metastatic ER PR positive HER-2 negative breast cancer-recently started on eribulin  is here for follow-up.  Patient denies any nausea vomiting.  Continues to have mild tingling and numbness of the extremities.  Denies any worsening shortness of breath or cough.  Mild to moderate fatigue.  Right thumb healing.   Review of Systems  Constitutional: Positive for malaise/fatigue. Negative for chills, diaphoresis, [name], hemoptysis, sputum production, [name], palpitations and orthopnea.  Gastrointestinal: Negative for abdominal pain, blood in stool, constipation, diarrhea, heartburn, melena, nausea and vomiting.  Genitourinary: Negative for dysuria, frequency and urgency.  Musculoskeletal: Negative for back pain and joint pain.  Skin: Negative.  Negative for itching and rash.  Neurological: Positive for tingling. Negative for dizziness, focal weakness, weakness and headaches.  Endo/Heme/Allergies: Does not bruise/bleed easily.  Psychiatric/Behavioral: Negative for depression. The patient is not nervous/anxious and does not have insomnia.     PAST MEDICAL HISTORY :  Past Medical History:  Diagnosis Date  . Anemia   . Anxiety   . Asthma   . Cancer ([place]) 03/10/2018  Per NM PET order. Carcinoma of upper-inner quadrant of left breast in  female, estrogen receptor positive .  Marland Kitchen Cancer (HCC)    LUNG  . CHF (congestive heart failure) ([place]) 1997  . CKD (chronic kidney disease)   . Depression   . Diabetes mellitus, type 2 ([place])   . Family history of breast cancer   . Family history of colon cancer   . Family history of ovarian cancer   . Family history of pancreatic cancer   . Family history of prostate cancer   . Family history of stomach cancer   . GERD (gastroesophageal reflux disease)    history of an ulcer  . Hair loss   . History of left breast cancer 05/29/14  . History of partial hysterectomy 12/31/2016   Per patient.  Has not had a period in years.  Had a partial hysterectomy years ago.  Marland Kitchen Hypertension   . Mitral valve regurgitation   . Neuromuscular disorder (HCC)    neuropathies in hand  . Obesity   . Pancreatitis 1997  . Stroke [facility]) 2010   with mild left arm weakness    PAST SURGICAL HISTORY :   Past Surgical History:  Procedure Laterality Date  . CATARACT EXTRACTION W/PHACO Right 02/24/2019   Procedure: CATARACT EXTRACTION PHACO AND INTRAOCULAR LENS PLACEMENT ([place]) RIGHT DIABETES;  Surgeon: Marchia Meiers, MD;  Location: [facility] ORS;  Service: Ophthalmology;  Laterality: Right;  [place] 01:13.0 CDE 7.96 Fluid Pack Lot # [id number]  . CATARACT EXTRACTION W/PHACO Left 03/24/2019   Procedure: CATARACT EXTRACTION PHACO AND INTRAOCULAR LENS PLACEMENT ([facility]) - left diabetic;  Surgeon: Marchia Meiers, MD;  Location: [facility] ORS;  Service: Ophthalmology;  Laterality: Left;  [place]  01:36 CDE 13.93 Fluid pack lot # [id number]  . CESAREAN SECTION    . CHOLECYSTECTOMY    . EXCISION OF TONGUE LESION N/A 08/17/2018   Procedure: EXCISION OF TONGUE LESION WITH FROZEN SECTIONS;  Surgeon: Beverly Gust, MD;  Location: [facility] ORS;  Service: ENT;  Laterality: N/A;  . EYE SURGERY Right    cataract extraction  . PARTIAL HYSTERECTOMY  12/31/2016   Per patient, she has not had a period in years since she had a partial hysterectomy.  Marland Kitchen  PORTA CATH INSERTION    . TUBAL LIGATION      FAMILY HISTORY :   Family History  Problem Relation Age of Onset  . Ovarian cancer Mother 96  . Diabetes Mother   . Hypertension Mother   . COPD Father   . Hypertension Father   . Colon cancer Father 6  . Diabetes Sister   . Breast cancer Sister 36       bilateral  . Diabetes Brother   . Leukemia Maternal Aunt   . Pancreatic cancer Paternal Aunt 52  . Pancreatic cancer Paternal Uncle   . Colon cancer Paternal Uncle   . Stomach cancer Maternal Grandfather 30  . Throat cancer Paternal Grandmother   . Breast cancer Maternal Aunt 80  . Colon cancer Maternal Aunt   . Bone cancer Maternal Aunt   . Breast cancer Paternal Aunt        dx >50  . Prostate cancer Paternal Uncle   . Pancreatic cancer Paternal Uncle   . Throat cancer Paternal Uncle   . Lung cancer Paternal Uncle   . Stomach cancer Paternal Uncle   . Brain cancer Paternal Aunt   . Cancer Cousin        liver, kidney  . Prostate  cancer Cousin        meastatic  . Lung cancer Other     SOCIAL HISTORY:   Social History   Tobacco Use  . Smoking status: Former Smoker    Packs/day: 0.50    Years: 1.00    Pack years: 0.50    Types: Cigarettes  . Smokeless tobacco: Never Used  Substance Use Topics  . Alcohol use: No    Alcohol/week: 0.0 standard drinks  . Drug use: No    ALLERGIES:  has No Known Allergies.  MEDICATIONS:  Current Outpatient Medications  Medication Sig Dispense Refill  . acetaminophen-codeine (TYLENOL #4) 300-60 MG tablet Take 1-2 tablets by mouth every 4 (four) hours as needed for moderate pain.     Marland Kitchen albuterol (PROAIR HFA) 108 (90 BASE) MCG/ACT inhaler Inhale 2 puffs into the lungs every 6 (six) hours as needed for wheezing or shortness of breath.     Marland Kitchen albuterol (PROVENTIL) (2.5 MG/3ML) 0.083% nebulizer solution Inhale 2.5 mg into the lungs every 6 (six) hours as needed for shortness of breath.     . ALPRAZolam (XANAX) 0.5 MG tablet Take 0.5 mg  by mouth at bedtime as needed for anxiety or sleep.     Marland Kitchen amLODipine (NORVASC) 10 MG tablet Take 10 mg by mouth daily.     Marland Kitchen aspirin EC 81 MG tablet Take 81 mg by mouth daily.     Marland Kitchen atenolol (TENORMIN) 100 MG tablet Take 100 mg by mouth 2 (two) times daily.     . B-D ULTRA-FINE 33 LANCETS MISC Use 1 each 2 (two) times daily.    . B-D ULTRAFINE III SHORT PEN 31G X 8 MM MISC     . bumetanide (BUMEX) 0.5 MG tablet Take 0.5 mg by mouth 2 (two) times daily.     . calcitRIOL (ROCALTROL) 0.25 MCG capsule Take 0.25 mcg by mouth every Monday, Wednesday, and Friday.     . Cinnamon 500 MG capsule Take 1,000 mg by mouth daily.     . cloNIDine (CATAPRES) 0.2 MG tablet Take 0.2 mg by mouth 2 (two) times daily.     . diflorasone (PSORCON) 0.05 % ointment     . enalapril (VASOTEC) 10 MG tablet Take 20 mg by mouth 2 (two) times a day.     . famotidine (PEPCID) 20 MG tablet Take 20 mg by mouth 2 (two) times daily.    . ferrous sulfate 325 (65 FE) MG tablet Take 325 mg by mouth 2 (two) times daily with a meal.    . FLUoxetine (PROZAC) 20 MG capsule Take 20 mg by mouth 2 (two) times daily.     . Fluticasone Propionate, Inhal, (FLOVENT DISKUS) 100 MCG/BLIST AEPB Inhale 2 puffs into the lungs 2 (two) times a day. Inhale 2 inhalations into the lungs 2 times daily PRN     . gabapentin (NEURONTIN) 100 MG capsule TAKE 1 CAPSULE(100 MG) BY MOUTH AT BEDTIME (Patient taking differently: Take 100 mg by mouth at bedtime. ) 30 capsule 6  . glucose blood (ONE TOUCH ULTRA TEST) test strip Use 1 each 2 (two) times daily. Use as instructed.    . glyBURIDE (DIABETA) 5 MG tablet Take 10 mg by mouth 2 (two) times daily with a meal.     . LEVEMIR FLEXTOUCH 100 UNIT/ML Pen Inject 55 Units into the skin daily.     Marland Kitchen loperamide (IMODIUM A-D) 2 MG capsule Take 2-4 mg by mouth 4 (four) times daily as needed for  diarrhea or loose stools.     . mometasone (NASONEX) 50 MCG/ACT nasal spray Place 2 sprays into the nose daily as needed  (Allergies).     . NOVOLOG FLEXPEN 100 UNIT/ML FlexPen Inject 7 Units into the skin 2 (two) times daily.     . ondansetron (ZOFRAN) 8 MG tablet One pill every 8 hours as needed for nausea/vomitting. (Patient taking differently: Take 8 mg by mouth every 8 (eight) hours as needed for nausea or vomiting. ) 40 tablet 1  . oxyCODONE-acetaminophen (PERCOCET/ROXICET) 5-325 MG tablet Take 1 tablet by mouth every 6 (six) hours as needed for moderate pain.     . prednisoLONE acetate (PRED FORTE) 1 % ophthalmic suspension Place 1 drop into the left eye 2 (two) times daily.    . prochlorperazine (COMPAZINE) 10 MG tablet Take 1 tablet (10 mg total) by mouth every 6 (six) hours as needed for nausea or vomiting. Please note change in strength (Patient taking differently: Take 10 mg by mouth every 6 (six) hours as needed for nausea or vomiting. ) 60 tablet 4  . salmeterol (SEREVENT) 50 MCG/DOSE diskus inhaler Inhale 1 puff into the lungs daily as needed (shortness of breath).     . simvastatin (ZOCOR) 20 MG tablet Take 20 mg by mouth daily at 6 PM.     . vitamin B-12 (CYANOCOBALAMIN) 1000 MCG tablet Take 1,000 mcg by mouth daily.     No current facility-administered medications for this visit.   Facility-Administered Medications Ordered in Other Visits  Medication Dose Route Frequency Provider Last Rate Last Admin  . sodium chloride flush (NS) 0.9 % injection 10 mL  10 mL Intravenous PRN Cammie Sickle, MD   10 mL at 01/30/16 1054    PHYSICAL EXAMINATION: ECOG PERFORMANCE STATUS: 1 - Symptomatic but completely ambulatory  BP 128/78 (BP Location: Right Arm, Patient Position: Sitting)   Pulse 67   Temp (!) 96.6 F (35.9 C) (Tympanic)   Wt 200 lb (90.7 kg)   BMI 35.43 kg/m   Filed Weights   07/25/19 0835  Weight: 200 lb (90.7 kg)    Physical Exam  Constitutional: She is oriented to person, place, and time and well-developed, well-nourished, and in no distress.  She is alone.  HENT:  Head:  Normocephalic and atraumatic.  Mouth/Throat: Oropharynx is clear and moist. No oropharyngeal exudate.  Eyes: Pupils are equal, round, and reactive to light.  Cardiovascular: Normal rate and regular rhythm.  Pulmonary/Chest: Breath sounds normal. No respiratory distress. She has no wheezes.  Abdominal: Soft. Bowel sounds are normal. She exhibits no distension and no mass. There is no abdominal tenderness. There is no rebound and no guarding.  Musculoskeletal:        General: Edema present. No tenderness. Normal range of motion.     Cervical back: Normal range of motion and neck supple.  Neurological: She is alert and oriented to person, place, and time.  Skin: Skin is warm.  Right thumb bandaged-wound healing.  No signs of infection or pus.  Psychiatric: Affect normal.    LABORATORY DATA:  I have reviewed the data as listed    Component Value Date/Time   NA 134 (L) 07/25/2019 0820   NA 130 (L) 06/06/2014 1102   K 4.1 07/25/2019 0820   K 3.9 06/06/2014 1102   CL 102 07/25/2019 0820   CL 95 (L) 06/06/2014 1102   CO2 22 07/25/2019 0820   CO2 28 06/06/2014 1102   GLUCOSE 203 (H) 07/25/2019  0820   GLUCOSE 349 (H) 06/06/2014 1102   BUN 42 (H) 07/25/2019 0820   BUN 17 06/06/2014 1102   CREATININE 3.65 (H) 07/25/2019 0820   CREATININE 1.63 (H) 06/06/2014 1102   CALCIUM 8.3 (L) 07/25/2019 0820   CALCIUM 9.2 06/06/2014 1102   PROT 7.1 07/25/2019 0820   PROT 8.2 06/06/2014 1102   ALBUMIN 3.9 07/25/2019 0820   ALBUMIN 3.3 (L) 06/06/2014 1102   AST 18 07/25/2019 0820   AST 7 (L) 06/06/2014 1102   ALT 14 07/25/2019 0820   ALT 12 (L) 06/06/2014 1102   ALKPHOS 61 07/25/2019 0820   ALKPHOS 74 06/06/2014 1102   BILITOT 0.6 07/25/2019 0820   BILITOT 0.4 06/06/2014 1102   GFRNONAA 13 (L) 07/25/2019 0820   GFRNONAA 35 (L) 06/06/2014 1102   GFRAA 15 (L) 07/25/2019 0820   GFRAA 42 (L) 06/06/2014 1102    No results found for: SPEP, UPEP  Lab Results  Component Value Date   WBC 8.1  07/25/2019   NEUTROABS 5.9 07/25/2019   HGB 9.7 (L) 07/25/2019   HCT 29.4 (L) 07/25/2019   MCV 96.4 07/25/2019   PLT 314 07/25/2019      Chemistry      Component Value Date/Time   NA 134 (L) 07/25/2019 0820   NA 130 (L) 06/06/2014 1102   K 4.1 07/25/2019 0820   K 3.9 06/06/2014 1102   CL 102 07/25/2019 0820   CL 95 (L) 06/06/2014 1102   CO2 22 07/25/2019 0820   CO2 28 06/06/2014 1102   BUN 42 (H) 07/25/2019 0820   BUN 17 06/06/2014 1102   CREATININE 3.65 (H) 07/25/2019 0820   CREATININE 1.63 (H) 06/06/2014 1102      Component Value Date/Time   CALCIUM 8.3 (L) 07/25/2019 0820   CALCIUM 9.2 06/06/2014 1102   ALKPHOS 61 07/25/2019 0820   ALKPHOS 74 06/06/2014 1102   AST 18 07/25/2019 0820   AST 7 (L) 06/06/2014 1102   ALT 14 07/25/2019 0820   ALT 12 (L) 06/06/2014 1102   BILITOT 0.6 07/25/2019 0820   BILITOT 0.4 06/06/2014 1102       RADIOGRAPHIC STUDIES: I have personally reviewed the radiological images as listed and agreed with the findings in the report. No results found.   ASSESSMENT & PLAN:  Carcinoma of upper-inner quadrant of left breast in female, estrogen receptor positive ([place]) Left breast cancer- stage IV- ER/PR +, HER-2/neu.  PET scan April 12, 2019-mild progressive disease ; increased uptake pleural-based lesion/lung lesions; intense uptake right acetabular region otherwise stable bone lesions/left breast mass; tumor marker rising.  Currently on eribulin [renally dosed [start at 1 mg/m] started on-04/29/2019.   #Proceed with cycle number 4-day 1 eribulin. Labs today reviewed;  acceptable for treatment today.  Discussed with the patient that tumor marker shows slightly increasing.  Will get a PET scan after this cycle.  If patient has progression of disease would recommend-subsequently different chemotherapy.  # Bone mets-sclerotic; Right acetabular uptake-s/p radiation. Hypocalcemia-ca-8.3/low vitD- on vit D daily.    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   # Bil LE swelling/ededma MILD- discussed re: compression stocking/ leg- STABLE.   # Chronic kidney disease - stage IV-STABLE; followed by nephrology.  # Anemia- hemoglobin today-9.2; STABLE. On PO iron.   # DISPOSITION:  # Eribulin today # 1 week- labs-cbc/bmp- Eribulin # PET week ofJan 4th; follow up on  8th- labs/MD-cbc/cmp-ca-27-29; Eribulin; PET prior - Dr.B   Orders Placed  This Encounter  Procedures  . NM PET Image Restag (PS) Skull Base To Thigh    Standing Status:   Future    Standing Expiration Date:   07/24/2020    Order Specific Question:   ** REASON FOR EXAM (FREE TEXT)    Answer:   breast cancer    Order Specific Question:   If indicated for the ordered procedure, I authorize the administration of a radiopharmaceutical per Radiology protocol    Answer:   Yes    Order Specific Question:   Radiology Contrast Protocol - do NOT remove file path    Answer:   [email or web address]  . CBC with Differential    Standing Status:   Future    Standing Expiration Date:   07/24/2020  . Basic metabolic panel    Standing Status:   Future    Standing Expiration Date:   07/24/2020  . CBC with Differential    Standing Status:   Future    Standing Expiration Date:   07/24/2020  . Comprehensive metabolic panel    Standing Status:   Future    Standing Expiration Date:   07/24/2020  . Cancer antigen 27.29    Standing Status:   Future    Standing Expiration Date:   07/24/2020   All questions were answered. The patient knows to call the clinic with any problems, questions or concerns.      Cammie Sickle, MD 07/25/2019 12:37 PM

## 2019-07-25 NOTE — Assessment & Plan Note (Addendum)
Left breast cancer- stage IV- ER/PR +, HER-2/neu.  PET scan April 12, 2019-mild progressive disease ; increased uptake pleural-based lesion/lung lesions; intense uptake right acetabular region otherwise stable bone lesions/left breast mass; tumor marker rising.  Currently on eribulin [renally dosed [start at 1 mg/m] started on-04/29/2019.   #Proceed with cycle number 4-day 1 eribulin. Labs today reviewed;  acceptable for treatment today.  Discussed with the patient that tumor marker shows slightly increasing.  Will get a PET scan after this cycle.  If patient has progression of disease would recommend-subsequently different chemotherapy.  # Bone mets-sclerotic; Right acetabular uptake-s/p radiation. Hypocalcemia-ca-8.3/low vitD- on vit D daily.    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   # Bil LE swelling/ededma MILD- discussed re: compression stocking/ leg- STABLE.   # Chronic kidney disease - stage IV-STABLE; followed by nephrology.  # Anemia- hemoglobin today-9.2; STABLE. On PO iron.   # DISPOSITION:  # Eribulin today # 1 week- labs-cbc/bmp- Eribulin # PET week ofJan 4th; follow up on  8th- labs/MD-cbc/cmp-ca-27-29; Eribulin; PET prior - Dr.B

## 2019-07-26 DIAGNOSIS — S68521D Partial traumatic transphalangeal amputation of right thumb, subsequent encounter: Secondary | ICD-10-CM | POA: Diagnosis not present

## 2019-07-26 LAB — CANCER ANTIGEN 27.29: CA 27.29: 100.2 U/mL — ABNORMAL HIGH (ref 0.0–38.6)

## 2019-08-01 ENCOUNTER — Ambulatory Visit: Payer: Medicare HMO | Admitting: Radiation Oncology

## 2019-08-03 ENCOUNTER — Inpatient Hospital Stay: Payer: Medicare HMO

## 2019-08-03 ENCOUNTER — Other Ambulatory Visit: Payer: Self-pay

## 2019-08-03 VITALS — BP 113/69 | HR 64 | Temp 96.6°F | Resp 20 | Wt 197.2 lb

## 2019-08-03 DIAGNOSIS — R600 Localized edema: Secondary | ICD-10-CM | POA: Diagnosis not present

## 2019-08-03 DIAGNOSIS — Z170 Estrogen receptor positive status [ER+]: Secondary | ICD-10-CM | POA: Diagnosis not present

## 2019-08-03 DIAGNOSIS — C7951 Secondary malignant neoplasm of bone: Secondary | ICD-10-CM | POA: Diagnosis not present

## 2019-08-03 DIAGNOSIS — R809 Proteinuria, unspecified: Secondary | ICD-10-CM | POA: Diagnosis not present

## 2019-08-03 DIAGNOSIS — C50212 Malignant neoplasm of upper-inner quadrant of left female breast: Secondary | ICD-10-CM

## 2019-08-03 DIAGNOSIS — D649 Anemia, unspecified: Secondary | ICD-10-CM | POA: Diagnosis not present

## 2019-08-03 DIAGNOSIS — E871 Hypo-osmolality and hyponatremia: Secondary | ICD-10-CM | POA: Diagnosis not present

## 2019-08-03 DIAGNOSIS — Z5111 Encounter for antineoplastic chemotherapy: Secondary | ICD-10-CM | POA: Diagnosis not present

## 2019-08-03 DIAGNOSIS — E1122 Type 2 diabetes mellitus with diabetic chronic kidney disease: Secondary | ICD-10-CM | POA: Diagnosis not present

## 2019-08-03 DIAGNOSIS — I120 Hypertensive chronic kidney disease with stage 5 chronic kidney disease or end stage renal disease: Secondary | ICD-10-CM | POA: Diagnosis not present

## 2019-08-03 DIAGNOSIS — N185 Chronic kidney disease, stage 5: Secondary | ICD-10-CM | POA: Diagnosis not present

## 2019-08-03 DIAGNOSIS — D631 Anemia in chronic kidney disease: Secondary | ICD-10-CM | POA: Diagnosis not present

## 2019-08-03 DIAGNOSIS — N2581 Secondary hyperparathyroidism of renal origin: Secondary | ICD-10-CM | POA: Diagnosis not present

## 2019-08-03 DIAGNOSIS — Z7189 Other specified counseling: Secondary | ICD-10-CM

## 2019-08-03 DIAGNOSIS — Z95828 Presence of other vascular implants and grafts: Secondary | ICD-10-CM

## 2019-08-03 DIAGNOSIS — I130 Hypertensive heart and chronic kidney disease with heart failure and stage 1 through stage 4 chronic kidney disease, or unspecified chronic kidney disease: Secondary | ICD-10-CM | POA: Diagnosis not present

## 2019-08-03 DIAGNOSIS — R5383 Other fatigue: Secondary | ICD-10-CM | POA: Diagnosis not present

## 2019-08-03 DIAGNOSIS — R5381 Other malaise: Secondary | ICD-10-CM | POA: Diagnosis not present

## 2019-08-03 LAB — CBC WITH DIFFERENTIAL/PLATELET
Abs Immature Granulocytes: 0.16 10*3/uL — ABNORMAL HIGH (ref 0.00–0.07)
Basophils Absolute: 0.1 10*3/uL (ref 0.0–0.1)
Basophils Relative: 1 %
Eosinophils Absolute: 0 10*3/uL (ref 0.0–0.5)
Eosinophils Relative: 0 %
HCT: 28.3 % — ABNORMAL LOW (ref 36.0–46.0)
Hemoglobin: 9.5 g/dL — ABNORMAL LOW (ref 12.0–15.0)
Immature Granulocytes: 2 %
Lymphocytes Relative: 9 %
Lymphs Abs: 0.9 10*3/uL (ref 0.7–4.0)
MCH: 32 pg (ref 26.0–34.0)
MCHC: 33.6 g/dL (ref 30.0–36.0)
MCV: 95.3 fL (ref 80.0–100.0)
Monocytes Absolute: 0.4 10*3/uL (ref 0.1–1.0)
Monocytes Relative: 4 %
Neutro Abs: 8.2 10*3/uL — ABNORMAL HIGH (ref 1.7–7.7)
Neutrophils Relative %: 84 %
Platelets: 318 10*3/uL (ref 150–400)
RBC: 2.97 MIL/uL — ABNORMAL LOW (ref 3.87–5.11)
RDW: 13.2 % (ref 11.5–15.5)
WBC: 9.7 10*3/uL (ref 4.0–10.5)
nRBC: 0 % (ref 0.0–0.2)

## 2019-08-03 LAB — BASIC METABOLIC PANEL
Anion gap: 10 (ref 5–15)
BUN: 42 mg/dL — ABNORMAL HIGH (ref 8–23)
CO2: 23 mmol/L (ref 22–32)
Calcium: 8.8 mg/dL — ABNORMAL LOW (ref 8.9–10.3)
Chloride: 103 mmol/L (ref 98–111)
Creatinine, Ser: 3.68 mg/dL — ABNORMAL HIGH (ref 0.44–1.00)
GFR calc Af Amer: 14 mL/min — ABNORMAL LOW (ref >60)
GFR calc non Af Amer: 12 mL/min — ABNORMAL LOW (ref >60)
Glucose, Bld: 108 mg/dL — ABNORMAL HIGH (ref 70–99)
Potassium: 3.7 mmol/L (ref 3.5–5.1)
Sodium: 136 mmol/L (ref 135–145)

## 2019-08-03 MED ORDER — SODIUM CHLORIDE 0.9% FLUSH
10.0000 mL | Freq: Once | INTRAVENOUS | Status: DC
  Filled 2019-08-03: qty 10

## 2019-08-03 MED ORDER — SODIUM CHLORIDE 0.9 % IV SOLN
Freq: Once | INTRAVENOUS | Status: AC
  Filled 2019-08-03: qty 250

## 2019-08-03 MED ORDER — HEPARIN SOD (PORK) LOCK FLUSH 100 UNIT/ML IV SOLN
INTRAVENOUS | Status: AC
  Filled 2019-08-03: qty 5

## 2019-08-03 MED ORDER — HEPARIN SOD (PORK) LOCK FLUSH 100 UNIT/ML IV SOLN
500.0000 [IU] | Freq: Once | INTRAVENOUS | Status: AC | PRN
  Administered 2019-08-03: 500 [IU]
  Filled 2019-08-03: qty 5

## 2019-08-03 MED ORDER — SODIUM CHLORIDE 0.9% FLUSH
10.0000 mL | INTRAVENOUS | Status: DC | PRN
  Administered 2019-08-03: 10:00:00 10 mL
  Filled 2019-08-03: qty 10

## 2019-08-03 MED ORDER — SODIUM CHLORIDE 0.9 % IV SOLN
2.0000 mg | Freq: Once | INTRAVENOUS | Status: AC
  Administered 2019-08-03: 2 mg via INTRAVENOUS
  Filled 2019-08-03: qty 4

## 2019-08-03 MED ORDER — PROCHLORPERAZINE MALEATE 10 MG PO TABS
10.0000 mg | ORAL_TABLET | Freq: Once | ORAL | Status: AC
  Administered 2019-08-03: 10 mg via ORAL
  Filled 2019-08-03: qty 1

## 2019-08-03 NOTE — Progress Notes (Signed)
Patient had CBC with Differential and Basic Metabolic Panel drawn today. Comprehensive Metabolic Panel was last done on 07/25/2019. MD, Dr. Rogue Bussing, notified and aware. Per MD order: Comprehensive Metabolic Panel is not needed today; reference CBC with Differential and Basic Metabolic Panel results and proceed with scheduled [place] treatment today.

## 2019-08-04 ENCOUNTER — Other Ambulatory Visit: Payer: Self-pay | Admitting: Internal Medicine

## 2019-08-09 ENCOUNTER — Other Ambulatory Visit: Payer: Self-pay | Admitting: Internal Medicine

## 2019-08-09 ENCOUNTER — Other Ambulatory Visit: Payer: Self-pay

## 2019-08-09 ENCOUNTER — Ambulatory Visit
Admission: RE | Admit: 2019-08-09 | Discharge: 2019-08-09 | Disposition: A | Discharge status: Home / Self Care | Payer: Medicare HMO | Source: Ambulatory Visit | Attending: Internal Medicine | Admitting: Internal Medicine

## 2019-08-09 DIAGNOSIS — J90 Pleural effusion, not elsewhere classified: Secondary | ICD-10-CM | POA: Diagnosis not present

## 2019-08-09 DIAGNOSIS — Z5111 Encounter for antineoplastic chemotherapy: Secondary | ICD-10-CM | POA: Diagnosis not present

## 2019-08-09 DIAGNOSIS — Z170 Estrogen receptor positive status [ER+]: Secondary | ICD-10-CM | POA: Diagnosis not present

## 2019-08-09 DIAGNOSIS — Z79899 Other long term (current) drug therapy: Secondary | ICD-10-CM | POA: Diagnosis not present

## 2019-08-09 DIAGNOSIS — N189 Chronic kidney disease, unspecified: Secondary | ICD-10-CM | POA: Diagnosis not present

## 2019-08-09 DIAGNOSIS — I130 Hypertensive heart and chronic kidney disease with heart failure and stage 1 through stage 4 chronic kidney disease, or unspecified chronic kidney disease: Secondary | ICD-10-CM | POA: Insufficient documentation

## 2019-08-09 DIAGNOSIS — I700 Atherosclerosis of aorta: Secondary | ICD-10-CM | POA: Insufficient documentation

## 2019-08-09 DIAGNOSIS — I509 Heart failure, unspecified: Secondary | ICD-10-CM | POA: Diagnosis not present

## 2019-08-09 DIAGNOSIS — C50212 Malignant neoplasm of upper-inner quadrant of left female breast: Secondary | ICD-10-CM | POA: Diagnosis not present

## 2019-08-09 DIAGNOSIS — C50912 Malignant neoplasm of unspecified site of left female breast: Secondary | ICD-10-CM | POA: Diagnosis not present

## 2019-08-09 DIAGNOSIS — C7951 Secondary malignant neoplasm of bone: Secondary | ICD-10-CM | POA: Insufficient documentation

## 2019-08-09 LAB — GLUCOSE, CAPILLARY: Glucose-Capillary: 142 mg/dL — ABNORMAL HIGH (ref 70–99)

## 2019-08-09 MED ORDER — FLUDEOXYGLUCOSE F - 18 (FDG) INJECTION
10.7000 | Freq: Once | INTRAVENOUS | Status: AC | PRN
  Administered 2019-08-09: 09:00:00 10.7 via INTRAVENOUS

## 2019-08-12 ENCOUNTER — Inpatient Hospital Stay: Payer: Medicare HMO

## 2019-08-12 ENCOUNTER — Other Ambulatory Visit: Payer: Self-pay

## 2019-08-12 ENCOUNTER — Inpatient Hospital Stay: Payer: Medicare HMO | Attending: Internal Medicine

## 2019-08-12 ENCOUNTER — Inpatient Hospital Stay (HOSPITAL_BASED_OUTPATIENT_CLINIC_OR_DEPARTMENT_OTHER): Payer: Medicare HMO | Admitting: Internal Medicine

## 2019-08-12 DIAGNOSIS — Z95828 Presence of other vascular implants and grafts: Secondary | ICD-10-CM

## 2019-08-12 DIAGNOSIS — Z800 Family history of malignant neoplasm of digestive organs: Secondary | ICD-10-CM | POA: Insufficient documentation

## 2019-08-12 DIAGNOSIS — C50212 Malignant neoplasm of upper-inner quadrant of left female breast: Secondary | ICD-10-CM | POA: Diagnosis not present

## 2019-08-12 DIAGNOSIS — Z923 Personal history of irradiation: Secondary | ICD-10-CM | POA: Insufficient documentation

## 2019-08-12 DIAGNOSIS — Z7951 Long term (current) use of inhaled steroids: Secondary | ICD-10-CM | POA: Diagnosis not present

## 2019-08-12 DIAGNOSIS — Z7982 Long term (current) use of aspirin: Secondary | ICD-10-CM | POA: Diagnosis not present

## 2019-08-12 DIAGNOSIS — D649 Anemia, unspecified: Secondary | ICD-10-CM | POA: Diagnosis not present

## 2019-08-12 DIAGNOSIS — M25562 Pain in left knee: Secondary | ICD-10-CM | POA: Insufficient documentation

## 2019-08-12 DIAGNOSIS — I509 Heart failure, unspecified: Secondary | ICD-10-CM | POA: Diagnosis not present

## 2019-08-12 DIAGNOSIS — M25561 Pain in right knee: Secondary | ICD-10-CM | POA: Insufficient documentation

## 2019-08-12 DIAGNOSIS — Z79899 Other long term (current) drug therapy: Secondary | ICD-10-CM | POA: Diagnosis not present

## 2019-08-12 DIAGNOSIS — R200 Anesthesia of skin: Secondary | ICD-10-CM | POA: Insufficient documentation

## 2019-08-12 DIAGNOSIS — I129 Hypertensive chronic kidney disease with stage 1 through stage 4 chronic kidney disease, or unspecified chronic kidney disease: Secondary | ICD-10-CM | POA: Diagnosis not present

## 2019-08-12 DIAGNOSIS — R202 Paresthesia of skin: Secondary | ICD-10-CM | POA: Diagnosis not present

## 2019-08-12 DIAGNOSIS — Z170 Estrogen receptor positive status [ER+]: Secondary | ICD-10-CM | POA: Diagnosis not present

## 2019-08-12 DIAGNOSIS — Z794 Long term (current) use of insulin: Secondary | ICD-10-CM | POA: Diagnosis not present

## 2019-08-12 DIAGNOSIS — E1122 Type 2 diabetes mellitus with diabetic chronic kidney disease: Secondary | ICD-10-CM | POA: Diagnosis not present

## 2019-08-12 DIAGNOSIS — R5381 Other malaise: Secondary | ICD-10-CM | POA: Insufficient documentation

## 2019-08-12 DIAGNOSIS — C7951 Secondary malignant neoplasm of bone: Secondary | ICD-10-CM | POA: Insufficient documentation

## 2019-08-12 DIAGNOSIS — Z87891 Personal history of nicotine dependence: Secondary | ICD-10-CM | POA: Insufficient documentation

## 2019-08-12 DIAGNOSIS — Z5111 Encounter for antineoplastic chemotherapy: Secondary | ICD-10-CM | POA: Diagnosis not present

## 2019-08-12 DIAGNOSIS — F418 Other specified anxiety disorders: Secondary | ICD-10-CM | POA: Diagnosis not present

## 2019-08-12 DIAGNOSIS — Z8673 Personal history of transient ischemic attack (TIA), and cerebral infarction without residual deficits: Secondary | ICD-10-CM | POA: Diagnosis not present

## 2019-08-12 DIAGNOSIS — Z806 Family history of leukemia: Secondary | ICD-10-CM | POA: Insufficient documentation

## 2019-08-12 DIAGNOSIS — R5383 Other fatigue: Secondary | ICD-10-CM | POA: Insufficient documentation

## 2019-08-12 DIAGNOSIS — Z8042 Family history of malignant neoplasm of prostate: Secondary | ICD-10-CM | POA: Insufficient documentation

## 2019-08-12 DIAGNOSIS — Z803 Family history of malignant neoplasm of breast: Secondary | ICD-10-CM | POA: Diagnosis not present

## 2019-08-12 DIAGNOSIS — Z801 Family history of malignant neoplasm of trachea, bronchus and lung: Secondary | ICD-10-CM | POA: Insufficient documentation

## 2019-08-12 DIAGNOSIS — Z8041 Family history of malignant neoplasm of ovary: Secondary | ICD-10-CM | POA: Insufficient documentation

## 2019-08-12 LAB — CBC WITH DIFFERENTIAL/PLATELET
Abs Immature Granulocytes: 0.08 10*3/uL — ABNORMAL HIGH (ref 0.00–0.07)
Basophils Absolute: 0.1 10*3/uL (ref 0.0–0.1)
Basophils Relative: 1 %
Eosinophils Absolute: 0 10*3/uL (ref 0.0–0.5)
Eosinophils Relative: 1 %
HCT: 27.9 % — ABNORMAL LOW (ref 36.0–46.0)
Hemoglobin: 9.3 g/dL — ABNORMAL LOW (ref 12.0–15.0)
Immature Granulocytes: 1 %
Lymphocytes Relative: 16 %
Lymphs Abs: 1 10*3/uL (ref 0.7–4.0)
MCH: 32.2 pg (ref 26.0–34.0)
MCHC: 33.3 g/dL (ref 30.0–36.0)
MCV: 96.5 fL (ref 80.0–100.0)
Monocytes Absolute: 0.5 10*3/uL (ref 0.1–1.0)
Monocytes Relative: 9 %
Neutro Abs: 4.5 10*3/uL (ref 1.7–7.7)
Neutrophils Relative %: 72 %
Platelets: 285 10*3/uL (ref 150–400)
RBC: 2.89 MIL/uL — ABNORMAL LOW (ref 3.87–5.11)
RDW: 13.2 % (ref 11.5–15.5)
WBC: 6.1 10*3/uL (ref 4.0–10.5)
nRBC: 0 % (ref 0.0–0.2)

## 2019-08-12 LAB — COMPREHENSIVE METABOLIC PANEL
ALT: 18 U/L (ref 0–44)
AST: 18 U/L (ref 15–41)
Albumin: 3.9 g/dL (ref 3.5–5.0)
Alkaline Phosphatase: 56 U/L (ref 38–126)
Anion gap: 10 (ref 5–15)
BUN: 40 mg/dL — ABNORMAL HIGH (ref 8–23)
CO2: 23 mmol/L (ref 22–32)
Calcium: 8.9 mg/dL (ref 8.9–10.3)
Chloride: 102 mmol/L (ref 98–111)
Creatinine, Ser: 3.35 mg/dL — ABNORMAL HIGH (ref 0.44–1.00)
GFR calc Af Amer: 16 mL/min — ABNORMAL LOW (ref >60)
GFR calc non Af Amer: 14 mL/min — ABNORMAL LOW (ref >60)
Glucose, Bld: 111 mg/dL — ABNORMAL HIGH (ref 70–99)
Potassium: 3.7 mmol/L (ref 3.5–5.1)
Sodium: 135 mmol/L (ref 135–145)
Total Bilirubin: 0.6 mg/dL (ref 0.3–1.2)
Total Protein: 7.2 g/dL (ref 6.5–8.1)

## 2019-08-12 MED ORDER — HEPARIN SOD (PORK) LOCK FLUSH 100 UNIT/ML IV SOLN
INTRAVENOUS | Status: AC
  Filled 2019-08-12: qty 5

## 2019-08-12 MED ORDER — SODIUM CHLORIDE 0.9% FLUSH
10.0000 mL | Freq: Once | INTRAVENOUS | Status: AC
  Administered 2019-08-12: 10 mL via INTRAVENOUS
  Filled 2019-08-12: qty 10

## 2019-08-12 MED ORDER — HEPARIN SOD (PORK) LOCK FLUSH 100 UNIT/ML IV SOLN
500.0000 [IU] | Freq: Once | INTRAVENOUS | Status: AC
  Administered 2019-08-12: 500 [IU] via INTRAVENOUS
  Filled 2019-08-12: qty 5

## 2019-08-12 NOTE — Progress Notes (Signed)
[facility] OFFICE PROGRESS NOTE  Patient Care Team: [name], [name], MD as Medical Oncologist (Medical Oncology) Corey Skains, MD as Consulting Physician (Cardiology)  Cancer Staging No matching staging information was found for the patient.   Oncology History Overview Note  # OCT 2015-STAGE IV LEFT BREAST T2N1 [T=4cm; [name]; [name]; [name]; EBUS- Positive Paratrac/subcarinal LN s/p ? Taxotere [in [place]; [name]; [name];[name]; [name]; suv 3]; [name]; [name]; [name];  [name]; Ant R iliac bone; [name]; [name]; START-Taxol weekly. March 2020- Taxol every 2 weeks [PN]; SEP 2020- PET progression  # SEP 9/25/200- ERIBULIN   # Poorly controlled Blood sugars- improved.   # Pancreatitis Hx/PEI- on creon in past / CKD IV [creat ~ 3-4; [name]; [name]; mild left sided weakness]  # Jan 2020-  Lobular lesion on tongue- s/p excision pyogenic granuloma [Dr.McQueen]   # GENETIC TESTING/COUNSELLING: HETEROZYGOUS Cystic Fibrosis Gene [explains hx of recurrent pancreatitis]  # MOLECULAR TESTING: NA  ------------------------------------------------   DIAGNOSIS: [ 8366] BREAST CA; [name]; [name] ;GOALS: Palliative  CURRENT/MOST RECENT THERAPY: ERIBULIN.     Carcinoma of upper-inner quadrant of left breast in female, estrogen receptor positive ([place])  04/29/2019 -  Chemotherapy   The patient  had eriBULin mesylate (HALAVEN) 2 mg in sodium chloride 0.9 % 100 mL chemo infusion, 2 mg, Intravenous,  Once, 4 of 7 cycles Dose modification: 1 mg/m2 (original dose 1 mg/m2, Cycle 1, Reason: Provider Judgment) Administration: 2 mg (06/03/2019), 2 mg (04/29/2019), 2 mg (06/10/2019), 2 mg (07/04/2019), 2 mg (07/11/2019), 2 mg (07/25/2019), 2 mg (08/03/2019)  for chemotherapy treatment.     INTERVAL HISTORY:  [name] 64 y.o.  female pleasant patient above history of metastatic ER PR positive HER-2 negative breast cancer-recently started on eribulin  is here for follow-up/review results of the PET scan.  Interim has been evaluated by nephrology-recommend holding off dialysis at this time.  Patient continues to have mild tingling and numbness in extremities.  Denies any worsening shortness of breath or cough.  Mild to moderate fatigue.  Review of Systems  Constitutional: Positive for malaise/fatigue. Negative for chills, diaphoresis, [name], hemoptysis, sputum production, [name], palpitations and orthopnea.  Gastrointestinal: Negative for abdominal pain, blood in stool, constipation, diarrhea, heartburn, melena, nausea and vomiting.  Genitourinary: Negative for dysuria, frequency and urgency.  Musculoskeletal: Negative for back pain and joint pain.  Skin: Negative.  Negative for itching and rash.  Neurological: Positive for tingling. Negative for dizziness, focal weakness, weakness and headaches.  Endo/Heme/Allergies: Does not bruise/bleed easily.  Psychiatric/Behavioral: Negative for depression. The patient is not nervous/anxious and does not have insomnia.     PAST MEDICAL HISTORY :  Past Medical History:  Diagnosis Date  . Anemia   . Anxiety   .  Asthma   . Cancer ([place]) 03/10/2018    Per [facility] PET order. Carcinoma of upper-inner quadrant of left breast in female, estrogen receptor positive .  Marland Kitchen Cancer (HCC)    LUNG  . CHF (congestive heart failure) ([place]) 1997  . CKD (chronic kidney disease)   . Depression   . Diabetes mellitus, type 2 ([place])   . Family history of breast cancer   . Family history of colon cancer   . Family history of ovarian cancer   . Family history of pancreatic cancer   . Family history of prostate cancer   . Family history of stomach cancer   . GERD (gastroesophageal reflux disease)    history of an ulcer  . Hair loss   . History of left breast cancer 05/29/14  . History of partial hysterectomy 12/31/2016   Per patient.  Has not had a period in years.  Had a partial hysterectomy years ago.  Marland Kitchen Hypertension   . Mitral valve regurgitation   . Neuromuscular disorder (HCC)    neuropathies in hand  . Obesity   . Pancreatitis 1997  . Stroke [facility]) 2010   with mild left arm weakness    PAST SURGICAL HISTORY :   Past Surgical History:  Procedure Laterality Date  . CATARACT EXTRACTION W/PHACO Right 02/24/2019   Procedure: CATARACT EXTRACTION PHACO AND INTRAOCULAR LENS PLACEMENT ([place]) RIGHT DIABETES;  Surgeon: Marchia Meiers, MD;  Location: [facility] ORS;  Service: Ophthalmology;  Laterality: Right;  [place] 01:13.0 CDE 7.96 Fluid Pack Lot # [id number]  . CATARACT EXTRACTION W/PHACO Left 03/24/2019   Procedure: CATARACT EXTRACTION PHACO AND INTRAOCULAR LENS PLACEMENT ([facility]) - left diabetic;  Surgeon: Marchia Meiers, MD;  Location: [facility] ORS;  Service: Ophthalmology;  Laterality: Left;  [place]  01:36 CDE 13.93 Fluid pack lot # [id number]  . CESAREAN SECTION    . CHOLECYSTECTOMY    . EXCISION OF TONGUE LESION N/A 08/17/2018   Procedure: EXCISION OF TONGUE LESION WITH FROZEN SECTIONS;  Surgeon: Beverly Gust, MD;  Location: [facility] ORS;  Service: ENT;  Laterality: N/A;  . EYE SURGERY Right    cataract extraction  . PARTIAL HYSTERECTOMY  12/31/2016   Per patient, she has  not had a period in years since she had a partial hysterectomy.  Marland Kitchen PORTA CATH INSERTION    . TUBAL LIGATION      FAMILY HISTORY :   Family History  Problem Relation Age of Onset  . Ovarian cancer Mother 27  . Diabetes Mother   . Hypertension Mother   . COPD Father   . Hypertension Father   . Colon cancer Father 48  . Diabetes Sister   . Breast cancer Sister 44       bilateral  . Diabetes Brother   . Leukemia Maternal Aunt   . Pancreatic cancer Paternal Aunt 14  . Pancreatic cancer Paternal Uncle   . Colon cancer Paternal Uncle   . Stomach cancer Maternal Grandfather 19  . Throat cancer Paternal Grandmother   . Breast cancer Maternal Aunt 80  . Colon cancer Maternal Aunt   . Bone cancer Maternal Aunt   . Breast cancer Paternal Aunt        dx >50  . Prostate cancer Paternal Uncle   . Pancreatic cancer Paternal Uncle   . Throat cancer Paternal Uncle   . Lung cancer Paternal Uncle   . Stomach cancer Paternal Uncle   . Brain cancer Paternal Aunt   . Cancer Cousin  liver, kidney  . Prostate cancer Cousin        meastatic  . Lung cancer Other     SOCIAL HISTORY:   Social History   Tobacco Use  . Smoking status: Former Smoker    Packs/day: 0.50    Years: 1.00    Pack years: 0.50    Types: Cigarettes  . Smokeless tobacco: Never Used  Substance Use Topics  . Alcohol use: No    Alcohol/week: 0.0 standard drinks  . Drug use: No    ALLERGIES:  has No Known Allergies.  MEDICATIONS:  Current Outpatient Medications  Medication Sig Dispense Refill  . acetaminophen-codeine (TYLENOL #4) 300-60 MG tablet Take 1-2 tablets by mouth every 4 (four) hours as needed for moderate pain.     Marland Kitchen albuterol (PROAIR HFA) 108 (90 BASE) MCG/ACT inhaler Inhale 2 puffs into the lungs every 6 (six) hours as needed for wheezing or shortness of breath.     Marland Kitchen albuterol (PROVENTIL) (2.5 MG/3ML) 0.083% nebulizer solution Inhale 2.5 mg into the lungs every 6 (six) hours as needed for  shortness of breath.     . ALPRAZolam (XANAX) 0.5 MG tablet Take 0.5 mg by mouth at bedtime as needed for anxiety or sleep.     Marland Kitchen amLODipine (NORVASC) 10 MG tablet Take 10 mg by mouth daily.     Marland Kitchen aspirin EC 81 MG tablet Take 81 mg by mouth daily.     Marland Kitchen atenolol (TENORMIN) 100 MG tablet Take 100 mg by mouth 2 (two) times daily.     . B-D ULTRA-FINE 33 LANCETS MISC Use 1 each 2 (two) times daily.    . B-D ULTRAFINE III SHORT PEN 31G X 8 MM MISC     . bumetanide (BUMEX) 0.5 MG tablet Take 0.5 mg by mouth 2 (two) times daily.     . calcitRIOL (ROCALTROL) 0.25 MCG capsule Take 0.25 mcg by mouth every Monday, Wednesday, and Friday.     . Cinnamon 500 MG capsule Take 1,000 mg by mouth daily.     . cloNIDine (CATAPRES) 0.2 MG tablet Take 0.2 mg by mouth 2 (two) times daily.     . diflorasone (PSORCON) 0.05 % ointment     . enalapril (VASOTEC) 10 MG tablet Take 20 mg by mouth 2 (two) times a day.     . famotidine (PEPCID) 20 MG tablet Take 20 mg by mouth 2 (two) times daily.    . ferrous sulfate 325 (65 FE) MG tablet Take 325 mg by mouth 2 (two) times daily with a meal.    . FLUoxetine (PROZAC) 20 MG capsule Take 20 mg by mouth 2 (two) times daily.     . Fluticasone Propionate, Inhal, (FLOVENT DISKUS) 100 MCG/BLIST AEPB Inhale 2 puffs into the lungs 2 (two) times a day. Inhale 2 inhalations into the lungs 2 times daily PRN     . gabapentin (NEURONTIN) 100 MG capsule TAKE 1 CAPSULE(100 MG) BY MOUTH AT BEDTIME (Patient taking differently: Take 100 mg by mouth at bedtime. ) 30 capsule 6  . glucose blood (ONE TOUCH ULTRA TEST) test strip Use 1 each 2 (two) times daily. Use as instructed.    . glyBURIDE (DIABETA) 5 MG tablet Take 10 mg by mouth 2 (two) times daily with a meal.     . LEVEMIR FLEXTOUCH 100 UNIT/ML Pen Inject 55 Units into the skin daily.     Marland Kitchen loperamide (IMODIUM A-D) 2 MG capsule Take 2-4 mg by mouth 4 (four)  times daily as needed for diarrhea or loose stools.     . mometasone (NASONEX) 50  MCG/ACT nasal spray Place 2 sprays into the nose daily as needed (Allergies).     . NOVOLOG FLEXPEN 100 UNIT/ML FlexPen Inject 7 Units into the skin 2 (two) times daily.     . ondansetron (ZOFRAN) 8 MG tablet One pill every 8 hours as needed for nausea/vomitting. (Patient taking differently: Take 8 mg by mouth every 8 (eight) hours as needed for nausea or vomiting. ) 40 tablet 1  . oxyCODONE-acetaminophen (PERCOCET/ROXICET) 5-325 MG tablet Take 1 tablet by mouth every 6 (six) hours as needed for moderate pain.     . prednisoLONE acetate (PRED FORTE) 1 % ophthalmic suspension Place 1 drop into the left eye 2 (two) times daily.    . prochlorperazine (COMPAZINE) 10 MG tablet Take 1 tablet (10 mg total) by mouth every 6 (six) hours as needed for nausea or vomiting. Please note change in strength (Patient taking differently: Take 10 mg by mouth every 6 (six) hours as needed for nausea or vomiting. ) 60 tablet 4  . salmeterol (SEREVENT) 50 MCG/DOSE diskus inhaler Inhale 1 puff into the lungs daily as needed (shortness of breath).     . simvastatin (ZOCOR) 20 MG tablet Take 20 mg by mouth daily at 6 PM.     . vitamin B-12 (CYANOCOBALAMIN) 1000 MCG tablet Take 1,000 mcg by mouth daily.     No current facility-administered medications for this visit.   Facility-Administered Medications Ordered in Other Visits  Medication Dose Route Frequency Provider Last Rate Last Admin  . sodium chloride flush (NS) 0.9 % injection 10 mL  10 mL Intravenous PRN Cammie Sickle, MD   10 mL at 01/30/16 1054    PHYSICAL EXAMINATION: ECOG PERFORMANCE STATUS: 1 - Symptomatic but completely ambulatory  BP (!) 150/90 (BP Location: Right Arm, Patient Position: Sitting)   Pulse 66   Temp (!) 96.5 F (35.8 C) (Tympanic)   Resp 20   Wt 196 lb (88.9 kg)   BMI 34.72 kg/m   Filed Weights   08/12/19 0837  Weight: 196 lb (88.9 kg)    Physical Exam  Constitutional: She is oriented to person, place, and time and  well-developed, well-nourished, and in no distress.  She is alone.  HENT:  Head: Normocephalic and atraumatic.  Mouth/Throat: Oropharynx is clear and moist. No oropharyngeal exudate.  Eyes: Pupils are equal, round, and reactive to light.  Cardiovascular: Normal rate and regular rhythm.  Pulmonary/Chest: Breath sounds normal. No respiratory distress. She has no wheezes.  Abdominal: Soft. Bowel sounds are normal. She exhibits no distension and no mass. There is no abdominal tenderness. There is no rebound and no guarding.  Musculoskeletal:        General: Edema present. No tenderness. Normal range of motion.     Cervical back: Normal range of motion and neck supple.  Neurological: She is alert and oriented to person, place, and time.  Skin: Skin is warm.  Right thumb bandaged-wound healing.  No signs of infection or pus.  Psychiatric: Affect normal.    LABORATORY DATA:  I have reviewed the data as listed    Component Value Date/Time   NA 135 08/12/2019 0824   NA 130 (L) 06/06/2014 1102   K 3.7 08/12/2019 0824   K 3.9 06/06/2014 1102   CL 102 08/12/2019 0824   CL 95 (L) 06/06/2014 1102   CO2 23 08/12/2019 0824   CO2  28 06/06/2014 1102   GLUCOSE 111 (H) 08/12/2019 0824   GLUCOSE 349 (H) 06/06/2014 1102   BUN 40 (H) 08/12/2019 0824   BUN 17 06/06/2014 1102   CREATININE 3.35 (H) 08/12/2019 0824   CREATININE 1.63 (H) 06/06/2014 1102   CALCIUM 8.9 08/12/2019 0824   CALCIUM 9.2 06/06/2014 1102   PROT 7.2 08/12/2019 0824   PROT 8.2 06/06/2014 1102   ALBUMIN 3.9 08/12/2019 0824   ALBUMIN 3.3 (L) 06/06/2014 1102   AST 18 08/12/2019 0824   AST 7 (L) 06/06/2014 1102   ALT 18 08/12/2019 0824   ALT 12 (L) 06/06/2014 1102   ALKPHOS 56 08/12/2019 0824   ALKPHOS 74 06/06/2014 1102   BILITOT 0.6 08/12/2019 0824   BILITOT 0.4 06/06/2014 1102   GFRNONAA 14 (L) 08/12/2019 0824   GFRNONAA 35 (L) 06/06/2014 1102   GFRAA 16 (L) 08/12/2019 0824   GFRAA 42 (L) 06/06/2014 1102    No  results found for: SPEP, UPEP  Lab Results  Component Value Date   WBC 6.1 08/12/2019   NEUTROABS 4.5 08/12/2019   HGB 9.3 (L) 08/12/2019   HCT 27.9 (L) 08/12/2019   MCV 96.5 08/12/2019   PLT 285 08/12/2019      Chemistry      Component Value Date/Time   NA 135 08/12/2019 0824   NA 130 (L) 06/06/2014 1102   K 3.7 08/12/2019 0824   K 3.9 06/06/2014 1102   CL 102 08/12/2019 0824   CL 95 (L) 06/06/2014 1102   CO2 23 08/12/2019 0824   CO2 28 06/06/2014 1102   BUN 40 (H) 08/12/2019 0824   BUN 17 06/06/2014 1102   CREATININE 3.35 (H) 08/12/2019 0824   CREATININE 1.63 (H) 06/06/2014 1102      Component Value Date/Time   CALCIUM 8.9 08/12/2019 0824   CALCIUM 9.2 06/06/2014 1102   ALKPHOS 56 08/12/2019 0824   ALKPHOS 74 06/06/2014 1102   AST 18 08/12/2019 0824   AST 7 (L) 06/06/2014 1102   ALT 18 08/12/2019 0824   ALT 12 (L) 06/06/2014 1102   BILITOT 0.6 08/12/2019 0824   BILITOT 0.4 06/06/2014 1102       RADIOGRAPHIC STUDIES: I have personally reviewed the radiological images as listed and agreed with the findings in the report. No results found.   ASSESSMENT & PLAN:  Carcinoma of upper-inner quadrant of left breast in female, estrogen receptor positive ([place]) Left breast cancer- stage IV- ER/PR +, HER-2/neu. Currently on eribulin [renally dosed [start at 1 mg/m] started on-04/29/2019.   PET scan JAN 5th, 2020-Partial response/ over all STABLE disease.  uptake pleural-based lesion/lung lesions/left breast mass. But tumor marker rising.   #Proceed with cycle number 5-day 1 eribulin. Labs today reviewed;  acceptable for treatment today.  Discussed with the patient the discrepancy between rising tumor markers and Stable PET scan.   # Bone mets-sclerotic; Right acetabular uptake-s/p radiation. Hypocalcemia-ca-8.3/low vitD- on vit D daily.  HOLD off Bisphosphonates/ denosumab.   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  # Anemia- hemoglobin today-9.2; STABLE. On PO iron.   # # I discussed regarding Covid precautions/and also discussed proceeding with Covid vaccination when available.  Discussed that unfortunately the data safety and efficacy of vaccination is unclear especially in patients with immunocompromised state.  However, I think the benefits of the vaccination outweigh the potential risks.   # DISPOSITION:  #  HOLD Eribulin today; de-access # on 1/15- labs-cbc/bmp- Eribulin # Follow up on 1/22; /MD-labs- cbc/cmp-ca-27-29; Eribulin; - Dr.B   Orders Placed This Encounter  Procedures  . CBC with Differential    Standing Status:   Future    Standing Expiration Date:   08/11/2020  . Basic metabolic panel    Standing Status:   Future    Standing Expiration Date:   08/11/2020  . CBC with Differential    Standing Status:   Future    Standing Expiration Date:   08/11/2020  . Comprehensive metabolic panel    Standing Status:   Future    Standing Expiration Date:   08/11/2020  . Cancer antigen 27.29    Standing Status:   Future    Standing Expiration Date:   08/11/2020   All questions were answered. The patient knows to call the clinic with any problems, questions or concerns.      Cammie Sickle, MD 08/12/2019 9:42 AM

## 2019-08-12 NOTE — Assessment & Plan Note (Addendum)
Left breast cancer- stage IV- ER/PR +, HER-2/neu. Currently on eribulin [renally dosed [start at 1 mg/m] started on-04/29/2019.   PET scan JAN 5th, 2020-Partial response/ over all STABLE disease.  uptake pleural-based lesion/lung lesions/left breast mass. But tumor marker rising.   #Proceed with cycle number 5-day 1 eribulin. Labs today reviewed;  acceptable for treatment today.  Discussed with the patient the discrepancy between rising tumor markers and Stable PET scan.   # Bone mets-sclerotic; Right acetabular uptake-s/p radiation. Hypocalcemia-ca-8.3/low vitD- on vit D daily.  HOLD off Bisphosphonates/ denosumab.   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  # Anemia- hemoglobin today-9.2; STABLE. On PO iron.   # # I discussed regarding Covid precautions/and also discussed proceeding with Covid vaccination when available.  Discussed that unfortunately the data safety and efficacy of vaccination is unclear especially in patients with immunocompromised state.  However, I think the benefits of the vaccination outweigh the potential risks.   # DISPOSITION:  # HOLD Eribulin today; de-access # on 1/15- labs-cbc/bmp- Eribulin # Follow up on 1/22; /MD-labs- cbc/cmp-ca-27-29; Eribulin; - Dr.B

## 2019-08-13 LAB — CANCER ANTIGEN 27.29: CA 27.29: 110.3 U/mL — ABNORMAL HIGH (ref 0.0–38.6)

## 2019-08-18 ENCOUNTER — Other Ambulatory Visit: Payer: Self-pay

## 2019-08-19 ENCOUNTER — Other Ambulatory Visit: Payer: Self-pay

## 2019-08-19 ENCOUNTER — Other Ambulatory Visit: Payer: Self-pay | Admitting: Internal Medicine

## 2019-08-19 ENCOUNTER — Inpatient Hospital Stay: Payer: Medicare HMO

## 2019-08-19 VITALS — BP 119/76 | HR 68 | Resp 20 | Wt 195.2 lb

## 2019-08-19 DIAGNOSIS — R5381 Other malaise: Secondary | ICD-10-CM | POA: Diagnosis not present

## 2019-08-19 DIAGNOSIS — D649 Anemia, unspecified: Secondary | ICD-10-CM | POA: Diagnosis not present

## 2019-08-19 DIAGNOSIS — Z923 Personal history of irradiation: Secondary | ICD-10-CM | POA: Diagnosis not present

## 2019-08-19 DIAGNOSIS — Z170 Estrogen receptor positive status [ER+]: Secondary | ICD-10-CM | POA: Diagnosis not present

## 2019-08-19 DIAGNOSIS — C7951 Secondary malignant neoplasm of bone: Secondary | ICD-10-CM | POA: Diagnosis not present

## 2019-08-19 DIAGNOSIS — C50212 Malignant neoplasm of upper-inner quadrant of left female breast: Secondary | ICD-10-CM

## 2019-08-19 DIAGNOSIS — Z5111 Encounter for antineoplastic chemotherapy: Secondary | ICD-10-CM | POA: Diagnosis not present

## 2019-08-19 DIAGNOSIS — Z7189 Other specified counseling: Secondary | ICD-10-CM

## 2019-08-19 DIAGNOSIS — R5383 Other fatigue: Secondary | ICD-10-CM | POA: Diagnosis not present

## 2019-08-19 LAB — BASIC METABOLIC PANEL
Anion gap: 10 (ref 5–15)
BUN: 43 mg/dL — ABNORMAL HIGH (ref 8–23)
CO2: 23 mmol/L (ref 22–32)
Calcium: 8.7 mg/dL — ABNORMAL LOW (ref 8.9–10.3)
Chloride: 103 mmol/L (ref 98–111)
Creatinine, Ser: 3.58 mg/dL — ABNORMAL HIGH (ref 0.44–1.00)
GFR calc Af Amer: 15 mL/min — ABNORMAL LOW (ref >60)
GFR calc non Af Amer: 13 mL/min — ABNORMAL LOW (ref >60)
Glucose, Bld: 129 mg/dL — ABNORMAL HIGH (ref 70–99)
Potassium: 4.3 mmol/L (ref 3.5–5.1)
Sodium: 136 mmol/L (ref 135–145)

## 2019-08-19 LAB — CBC WITH DIFFERENTIAL/PLATELET
Abs Immature Granulocytes: 0.25 10*3/uL — ABNORMAL HIGH (ref 0.00–0.07)
Basophils Absolute: 0.1 10*3/uL (ref 0.0–0.1)
Basophils Relative: 1 %
Eosinophils Absolute: 0.1 10*3/uL (ref 0.0–0.5)
Eosinophils Relative: 1 %
HCT: 30.3 % — ABNORMAL LOW (ref 36.0–46.0)
Hemoglobin: 10 g/dL — ABNORMAL LOW (ref 12.0–15.0)
Immature Granulocytes: 3 %
Lymphocytes Relative: 13 %
Lymphs Abs: 1.3 10*3/uL (ref 0.7–4.0)
MCH: 32.1 pg (ref 26.0–34.0)
MCHC: 33 g/dL (ref 30.0–36.0)
MCV: 97.1 fL (ref 80.0–100.0)
Monocytes Absolute: 1.1 10*3/uL — ABNORMAL HIGH (ref 0.1–1.0)
Monocytes Relative: 12 %
Neutro Abs: 6.8 10*3/uL (ref 1.7–7.7)
Neutrophils Relative %: 70 %
Platelets: 320 10*3/uL (ref 150–400)
RBC: 3.12 MIL/uL — ABNORMAL LOW (ref 3.87–5.11)
RDW: 12.9 % (ref 11.5–15.5)
WBC: 9.6 10*3/uL (ref 4.0–10.5)
nRBC: 0 % (ref 0.0–0.2)

## 2019-08-19 MED ORDER — SODIUM CHLORIDE 0.9 % IV SOLN
2.0000 mg | Freq: Once | INTRAVENOUS | Status: AC
  Administered 2019-08-19: 2 mg via INTRAVENOUS
  Filled 2019-08-19: qty 4

## 2019-08-19 MED ORDER — PROCHLORPERAZINE MALEATE 10 MG PO TABS
10.0000 mg | ORAL_TABLET | Freq: Once | ORAL | Status: AC
  Administered 2019-08-19: 10 mg via ORAL
  Filled 2019-08-19: qty 1

## 2019-08-19 MED ORDER — SODIUM CHLORIDE 0.9 % IV SOLN
Freq: Once | INTRAVENOUS | Status: AC
  Filled 2019-08-19: qty 250

## 2019-08-19 MED ORDER — HEPARIN SOD (PORK) LOCK FLUSH 100 UNIT/ML IV SOLN
INTRAVENOUS | Status: AC
  Filled 2019-08-19: qty 5

## 2019-08-19 MED ORDER — HEPARIN SOD (PORK) LOCK FLUSH 100 UNIT/ML IV SOLN
500.0000 [IU] | Freq: Once | INTRAVENOUS | Status: AC | PRN
  Administered 2019-08-19: 500 [IU]
  Filled 2019-08-19: qty 5

## 2019-08-19 MED ORDER — HEPARIN SOD (PORK) LOCK FLUSH 100 UNIT/ML IV SOLN
500.0000 [IU] | Freq: Once | INTRAVENOUS | Status: DC
  Filled 2019-08-19: qty 5

## 2019-08-19 MED ORDER — SODIUM CHLORIDE 0.9% FLUSH
10.0000 mL | Freq: Once | INTRAVENOUS | Status: AC
  Administered 2019-08-19: 08:00:00 10 mL via INTRAVENOUS
  Filled 2019-08-19: qty 10

## 2019-08-25 ENCOUNTER — Other Ambulatory Visit: Payer: Self-pay

## 2019-08-25 NOTE — Progress Notes (Signed)
Patient pre screened for office appointment, no questions or concerns today. Patient reminded of upcoming appointment time and date. 

## 2019-08-26 ENCOUNTER — Other Ambulatory Visit: Payer: Self-pay

## 2019-08-26 ENCOUNTER — Inpatient Hospital Stay: Payer: Medicare HMO

## 2019-08-26 ENCOUNTER — Inpatient Hospital Stay (HOSPITAL_BASED_OUTPATIENT_CLINIC_OR_DEPARTMENT_OTHER): Payer: Medicare HMO | Admitting: Internal Medicine

## 2019-08-26 VITALS — BP 138/81 | HR 74 | Temp 97.0°F | Resp 20 | Wt 196.0 lb

## 2019-08-26 DIAGNOSIS — Z170 Estrogen receptor positive status [ER+]: Secondary | ICD-10-CM

## 2019-08-26 DIAGNOSIS — R5381 Other malaise: Secondary | ICD-10-CM | POA: Diagnosis not present

## 2019-08-26 DIAGNOSIS — Z95828 Presence of other vascular implants and grafts: Secondary | ICD-10-CM

## 2019-08-26 DIAGNOSIS — D649 Anemia, unspecified: Secondary | ICD-10-CM | POA: Diagnosis not present

## 2019-08-26 DIAGNOSIS — Z5111 Encounter for antineoplastic chemotherapy: Secondary | ICD-10-CM | POA: Diagnosis not present

## 2019-08-26 DIAGNOSIS — C50212 Malignant neoplasm of upper-inner quadrant of left female breast: Secondary | ICD-10-CM

## 2019-08-26 DIAGNOSIS — C7951 Secondary malignant neoplasm of bone: Secondary | ICD-10-CM | POA: Diagnosis not present

## 2019-08-26 DIAGNOSIS — Z7189 Other specified counseling: Secondary | ICD-10-CM

## 2019-08-26 DIAGNOSIS — R5383 Other fatigue: Secondary | ICD-10-CM | POA: Diagnosis not present

## 2019-08-26 DIAGNOSIS — Z923 Personal history of irradiation: Secondary | ICD-10-CM | POA: Diagnosis not present

## 2019-08-26 LAB — COMPREHENSIVE METABOLIC PANEL
ALT: 16 U/L (ref 0–44)
AST: 18 U/L (ref 15–41)
Albumin: 4 g/dL (ref 3.5–5.0)
Alkaline Phosphatase: 57 U/L (ref 38–126)
Anion gap: 11 (ref 5–15)
BUN: 51 mg/dL — ABNORMAL HIGH (ref 8–23)
CO2: 20 mmol/L — ABNORMAL LOW (ref 22–32)
Calcium: 8.6 mg/dL — ABNORMAL LOW (ref 8.9–10.3)
Chloride: 100 mmol/L (ref 98–111)
Creatinine, Ser: 3.84 mg/dL — ABNORMAL HIGH (ref 0.44–1.00)
GFR calc Af Amer: 14 mL/min — ABNORMAL LOW (ref >60)
GFR calc non Af Amer: 12 mL/min — ABNORMAL LOW (ref >60)
Glucose, Bld: 207 mg/dL — ABNORMAL HIGH (ref 70–99)
Potassium: 4.1 mmol/L (ref 3.5–5.1)
Sodium: 131 mmol/L — ABNORMAL LOW (ref 135–145)
Total Bilirubin: 0.7 mg/dL (ref 0.3–1.2)
Total Protein: 7.4 g/dL (ref 6.5–8.1)

## 2019-08-26 LAB — CBC WITH DIFFERENTIAL/PLATELET
Abs Immature Granulocytes: 0.16 10*3/uL — ABNORMAL HIGH (ref 0.00–0.07)
Basophils Absolute: 0.1 10*3/uL (ref 0.0–0.1)
Basophils Relative: 1 %
Eosinophils Absolute: 0 10*3/uL (ref 0.0–0.5)
Eosinophils Relative: 0 %
HCT: 28.5 % — ABNORMAL LOW (ref 36.0–46.0)
Hemoglobin: 9.5 g/dL — ABNORMAL LOW (ref 12.0–15.0)
Immature Granulocytes: 2 %
Lymphocytes Relative: 13 %
Lymphs Abs: 1.3 10*3/uL (ref 0.7–4.0)
MCH: 32 pg (ref 26.0–34.0)
MCHC: 33.3 g/dL (ref 30.0–36.0)
MCV: 96 fL (ref 80.0–100.0)
Monocytes Absolute: 0.3 10*3/uL (ref 0.1–1.0)
Monocytes Relative: 4 %
Neutro Abs: 7.5 10*3/uL (ref 1.7–7.7)
Neutrophils Relative %: 80 %
Platelets: 286 10*3/uL (ref 150–400)
RBC: 2.97 MIL/uL — ABNORMAL LOW (ref 3.87–5.11)
RDW: 12.4 % (ref 11.5–15.5)
WBC: 9.3 10*3/uL (ref 4.0–10.5)
nRBC: 0 % (ref 0.0–0.2)

## 2019-08-26 MED ORDER — HEPARIN SOD (PORK) LOCK FLUSH 100 UNIT/ML IV SOLN
INTRAVENOUS | Status: AC
  Filled 2019-08-26: qty 5

## 2019-08-26 MED ORDER — SODIUM CHLORIDE 0.9% FLUSH
10.0000 mL | Freq: Once | INTRAVENOUS | Status: AC
  Administered 2019-08-26: 10 mL via INTRAVENOUS
  Filled 2019-08-26: qty 10

## 2019-08-26 MED ORDER — PROCHLORPERAZINE MALEATE 10 MG PO TABS
10.0000 mg | ORAL_TABLET | Freq: Once | ORAL | Status: AC
  Administered 2019-08-26: 10 mg via ORAL
  Filled 2019-08-26: qty 1

## 2019-08-26 MED ORDER — SODIUM CHLORIDE 0.9 % IV SOLN
Freq: Once | INTRAVENOUS | Status: AC
  Filled 2019-08-26: qty 250

## 2019-08-26 MED ORDER — SODIUM CHLORIDE 0.9 % IV SOLN
2.0000 mg | Freq: Once | INTRAVENOUS | Status: AC
  Administered 2019-08-26: 2 mg via INTRAVENOUS
  Filled 2019-08-26: qty 4

## 2019-08-26 MED ORDER — HEPARIN SOD (PORK) LOCK FLUSH 100 UNIT/ML IV SOLN
500.0000 [IU] | Freq: Once | INTRAVENOUS | Status: AC | PRN
  Administered 2019-08-26: 500 [IU]
  Filled 2019-08-26: qty 5

## 2019-08-26 NOTE — Progress Notes (Signed)
[facility] OFFICE PROGRESS NOTE  Patient Care Team: [name], [name], MD as Medical Oncologist (Medical Oncology) Corey Skains, MD as Consulting Physician (Cardiology)  Cancer Staging No matching staging information was found for the patient.   Oncology History Overview Note  # OCT 2015-STAGE IV LEFT BREAST T2N1 [T=4cm; [name]; [name]; [name]; EBUS- Positive Paratrac/subcarinal LN s/p ? Taxotere [in [place]; [name]; [name];[name]; [name]; suv 3]; [name]; [name]; [name];  [name]; Ant R iliac bone; [name]; [name]; START-Taxol weekly. March 2020- Taxol every 2 weeks [PN]; SEP 2020- PET progression  # SEP 9/25/200- ERIBULIN   # Poorly controlled Blood sugars- improved.   # Pancreatitis Hx/PEI- on creon in past / CKD IV [creat ~ 3-4; [name]; [name]; mild left sided weakness]  # Jan 2020-  Lobular lesion on tongue- s/p excision pyogenic granuloma [Dr.McQueen]   # GENETIC TESTING/COUNSELLING: HETEROZYGOUS Cystic Fibrosis Gene [explains hx of recurrent pancreatitis]  # MOLECULAR TESTING: NA   # PALLIATIVE CARE: 1/22-Discussed/P ------------------------------------------------   DIAGNOSIS: [ 0051] BREAST CA; [name]; [name] ;GOALS: Palliative  CURRENT/MOST RECENT THERAPY: ERIBULIN [C].     Carcinoma of upper-inner quadrant of left breast in female, estrogen receptor positive ([place])   04/29/2019 -  Chemotherapy   The patient had eriBULin mesylate (HAL[place]) 2 mg in sodium chloride 0.9 % 100 mL chemo infusion, 2 mg, Intravenous,  Once, 5 of 7 cycles Dose modification: 1 mg/m2 (original dose 1 mg/m2, Cycle 1, Reason: Provider Judgment) Administration: 2 mg (06/03/2019), 2 mg (04/29/2019), 2 mg (06/10/2019), 2 mg (07/04/2019), 2 mg (07/11/2019), 2 mg (07/25/2019), 2 mg (08/03/2019), 2 mg (08/19/2019), 2 mg (08/26/2019)  for chemotherapy treatment.     INTERVAL HISTORY:  [name] 64 y.o.  female pleasant patient above history of metastatic ER PR positive HER-2 negative breast cancer-recently started on eribulin  is here for follow-up.  Patient has bilateral knee pain right more than left.  Things are slightly worse than usual.  Otherwise denies any nausea vomiting.  Appetite is fair.  Denies any worsening swelling the legs.  Chronic mild tingling and numbness in extremities.  Chronic fatigue.  No fever chills or cough.  Review of Systems  Constitutional: Positive for malaise/fatigue. Negative for chills, diaphoresis, [name], hemoptysis, sputum production, [name], palpitations and orthopnea.  Gastrointestinal: Negative for abdominal pain, blood in stool, constipation, diarrhea, heartburn, melena, nausea and vomiting.  Genitourinary: Negative for dysuria, frequency and urgency.  Musculoskeletal: Negative for back pain and joint pain.  Skin: Negative.  Negative for itching and rash.  Neurological: Positive for tingling. Negative for dizziness, focal weakness, weakness and headaches.  Endo/Heme/Allergies: Does not bruise/bleed easily.  Psychiatric/Behavioral: Negative for depression. The patient is not nervous/anxious and does not have insomnia.  PAST MEDICAL HISTORY :   Past Medical History:  Diagnosis Date  . Anemia   . Anxiety   . Asthma   . Cancer ([place]) 03/10/2018   Per [facility] PET order. Carcinoma of upper-inner quadrant of left breast in female, estrogen receptor positive .  Marland Kitchen Cancer (HCC)    LUNG  . CHF (congestive heart failure) ([place]) 1997  . CKD (chronic kidney disease)   . Depression   . Diabetes mellitus, type 2 ([place])   . Family history of breast cancer   . Family history of colon cancer   . Family history of ovarian cancer   . Family history of pancreatic cancer   . Family history of prostate cancer   . Family history of stomach cancer   . GERD (gastroesophageal reflux disease)    history of an ulcer  . Hair loss   . History of left breast cancer 05/29/14  . History of partial hysterectomy 12/31/2016   Per patient.  Has not had a period in years.  Had a partial hysterectomy years ago.  Marland Kitchen Hypertension   . Mitral valve regurgitation   . Neuromuscular disorder (HCC)    neuropathies in hand  . Obesity   . Pancreatitis 1997  . Stroke [facility]) 2010   with mild left arm weakness    PAST SURGICAL HISTORY :   Past Surgical History:  Procedure Laterality Date  . CATARACT EXTRACTION W/PHACO Right 02/24/2019   Procedure: CATARACT EXTRACTION PHACO AND INTRAOCULAR LENS PLACEMENT ([place]) RIGHT DIABETES;  Surgeon: Marchia Meiers, MD;  Location: [facility] ORS;  Service: Ophthalmology;  Laterality: Right;  [place] 01:13.0 CDE 7.96 Fluid Pack Lot # [id number]  . CATARACT EXTRACTION W/PHACO Left 03/24/2019   Procedure: CATARACT EXTRACTION PHACO AND INTRAOCULAR LENS PLACEMENT ([facility]) - left diabetic;  Surgeon: Marchia Meiers, MD;  Location: [facility] ORS;  Service: Ophthalmology;  Laterality: Left;  [place]  01:36 CDE 13.93 Fluid pack lot # [id number]  . CESAREAN SECTION    . CHOLECYSTECTOMY    . EXCISION OF TONGUE LESION N/A 08/17/2018   Procedure: EXCISION OF TONGUE LESION WITH FROZEN SECTIONS;  Surgeon: Beverly Gust, MD;  Location: [facility] ORS;  Service: ENT;  Laterality: N/A;   . EYE SURGERY Right    cataract extraction  . PARTIAL HYSTERECTOMY  12/31/2016   Per patient, she has not had a period in years since she had a partial hysterectomy.  Marland Kitchen PORTA CATH INSERTION    . TUBAL LIGATION      FAMILY HISTORY :   Family History  Problem Relation Age of Onset  . Ovarian cancer Mother 2  . Diabetes Mother   . Hypertension Mother   . COPD Father   . Hypertension Father   . Colon cancer Father 69  . Diabetes Sister   . Breast cancer Sister 75       bilateral  . Diabetes Brother   . Leukemia Maternal Aunt   . Pancreatic cancer Paternal Aunt 2  . Pancreatic cancer Paternal Uncle   . Colon cancer Paternal Uncle   . Stomach cancer Maternal Grandfather 72  . Throat cancer Paternal Grandmother   . Breast cancer Maternal Aunt 80  . Colon cancer Maternal Aunt   . Bone cancer Maternal Aunt   . Breast cancer Paternal Aunt        dx >50  . Prostate cancer Paternal Uncle   . Pancreatic cancer Paternal Uncle   . Throat cancer Paternal Uncle   . Lung cancer Paternal Uncle   .  Stomach cancer Paternal Uncle   . Brain cancer Paternal Aunt   . Cancer Cousin        liver, kidney  . Prostate cancer Cousin        meastatic  . Lung cancer Other     SOCIAL HISTORY:   Social History   Tobacco Use  . Smoking status: Former Smoker    Packs/day: 0.50    Years: 1.00    Pack years: 0.50    Types: Cigarettes  . Smokeless tobacco: Never Used  Substance Use Topics  . Alcohol use: No    Alcohol/week: 0.0 standard drinks  . Drug use: No    ALLERGIES:  has No Known Allergies.  MEDICATIONS:  Current Outpatient Medications  Medication Sig Dispense Refill  . acetaminophen-codeine (TYLENOL #4) 300-60 MG tablet Take 1-2 tablets by mouth every 4 (four) hours as needed for moderate pain.     Marland Kitchen albuterol (PROAIR HFA) 108 (90 BASE) MCG/ACT inhaler Inhale 2 puffs into the lungs every 6 (six) hours as needed for wheezing or shortness of breath.     Marland Kitchen albuterol (PROVENTIL)  (2.5 MG/3ML) 0.083% nebulizer solution Inhale 2.5 mg into the lungs every 6 (six) hours as needed for shortness of breath.     . ALPRAZolam (XANAX) 0.5 MG tablet Take 0.5 mg by mouth at bedtime as needed for anxiety or sleep.     Marland Kitchen amLODipine (NORVASC) 10 MG tablet Take 10 mg by mouth daily.     Marland Kitchen aspirin EC 81 MG tablet Take 81 mg by mouth daily.     Marland Kitchen atenolol (TENORMIN) 100 MG tablet Take 100 mg by mouth 2 (two) times daily.     . B-D ULTRA-FINE 33 LANCETS MISC Use 1 each 2 (two) times daily.    . B-D ULTRAFINE III SHORT PEN 31G X 8 MM MISC     . bumetanide (BUMEX) 0.5 MG tablet Take 0.5 mg by mouth 2 (two) times daily.     . calcitRIOL (ROCALTROL) 0.25 MCG capsule Take 0.25 mcg by mouth every Monday, Wednesday, and Friday.     . Cinnamon 500 MG capsule Take 1,000 mg by mouth daily.     . cloNIDine (CATAPRES) 0.2 MG tablet Take 0.2 mg by mouth 2 (two) times daily.     . diflorasone (PSORCON) 0.05 % ointment     . enalapril (VASOTEC) 10 MG tablet Take 20 mg by mouth 2 (two) times a day.     . famotidine (PEPCID) 20 MG tablet Take 20 mg by mouth 2 (two) times daily.    . ferrous sulfate 325 (65 FE) MG tablet Take 325 mg by mouth 2 (two) times daily with a meal.    . FLUoxetine (PROZAC) 20 MG capsule Take 20 mg by mouth 2 (two) times daily.     . Fluticasone Propionate, Inhal, (FLOVENT DISKUS) 100 MCG/BLIST AEPB Inhale 2 puffs into the lungs 2 (two) times a day. Inhale 2 inhalations into the lungs 2 times daily PRN     . gabapentin (NEURONTIN) 100 MG capsule TAKE 1 CAPSULE(100 MG) BY MOUTH AT BEDTIME (Patient taking differently: Take 100 mg by mouth at bedtime. ) 30 capsule 6  . glucose blood (ONE TOUCH ULTRA TEST) test strip Use 1 each 2 (two) times daily. Use as instructed.    . glyBURIDE (DIABETA) 5 MG tablet Take 10 mg by mouth 2 (two) times daily with a meal.     . LEVEMIR FLEXTOUCH 100 UNIT/ML Pen Inject 55  Units into the skin daily.     Marland Kitchen loperamide (IMODIUM A-D) 2 MG capsule Take 2-4 mg  by mouth 4 (four) times daily as needed for diarrhea or loose stools.     . mometasone (NASONEX) 50 MCG/ACT nasal spray Place 2 sprays into the nose daily as needed (Allergies).     . NOVOLOG FLEXPEN 100 UNIT/ML FlexPen Inject 7 Units into the skin 2 (two) times daily.     . ondansetron (ZOFRAN) 8 MG tablet One pill every 8 hours as needed for nausea/vomitting. (Patient taking differently: Take 8 mg by mouth every 8 (eight) hours as needed for nausea or vomiting. ) 40 tablet 1  . oxyCODONE-acetaminophen (PERCOCET/ROXICET) 5-325 MG tablet Take 1 tablet by mouth every 6 (six) hours as needed for moderate pain.     . prednisoLONE acetate (PRED FORTE) 1 % ophthalmic suspension Place 1 drop into the left eye 2 (two) times daily.    . prochlorperazine (COMPAZINE) 10 MG tablet Take 1 tablet (10 mg total) by mouth every 6 (six) hours as needed for nausea or vomiting. Please note change in strength (Patient taking differently: Take 10 mg by mouth every 6 (six) hours as needed for nausea or vomiting. ) 60 tablet 4  . salmeterol (SEREVENT) 50 MCG/DOSE diskus inhaler Inhale 1 puff into the lungs daily as needed (shortness of breath).     . simvastatin (ZOCOR) 20 MG tablet Take 20 mg by mouth daily at 6 PM.     . vitamin B-12 (CYANOCOBALAMIN) 1000 MCG tablet Take 1,000 mcg by mouth daily.     No current facility-administered medications for this visit.   Facility-Administered Medications Ordered in Other Visits  Medication Dose Route Frequency Provider Last Rate Last Admin  . sodium chloride flush (NS) 0.9 % injection 10 mL  10 mL Intravenous PRN Cammie Sickle, MD   10 mL at 01/30/16 1054    PHYSICAL EXAMINATION: ECOG PERFORMANCE STATUS: 1 - Symptomatic but completely ambulatory  BP 131/88 (BP Location: Left Arm, Patient Position: Sitting)   Pulse 88   Temp (!) 97.1 F (36.2 C) (Tympanic)   Resp 20   Ht '5\' 3"'  (1.6 m)   Wt 196 lb (88.9 kg)   BMI 34.72 kg/m   Filed Weights   08/26/19 0839   Weight: 196 lb (88.9 kg)    Physical Exam  Constitutional: She is oriented to person, place, and time and well-developed, well-nourished, and in no distress.  She is alone.  HENT:  Head: Normocephalic and atraumatic.  Mouth/Throat: Oropharynx is clear and moist. No oropharyngeal exudate.  Eyes: Pupils are equal, round, and reactive to light.  Cardiovascular: Normal rate and regular rhythm.  Pulmonary/Chest: Breath sounds normal. No respiratory distress. She has no wheezes.  Abdominal: Soft. Bowel sounds are normal. She exhibits no distension and no mass. There is no abdominal tenderness. There is no rebound and no guarding.  Musculoskeletal:        General: Edema present. No tenderness. Normal range of motion.     Cervical back: Normal range of motion and neck supple.  Neurological: She is alert and oriented to person, place, and time.  Skin: Skin is warm.  Right thumb bandaged-wound healing.  No signs of infection or pus.  Psychiatric: Affect normal.    LABORATORY DATA:  I have reviewed the data as listed    Component Value Date/Time   NA 131 (L) 08/26/2019 0801   NA 130 (L) 06/06/2014 1102   K 4.1  08/26/2019 0801   K 3.9 06/06/2014 1102   CL 100 08/26/2019 0801   CL 95 (L) 06/06/2014 1102   CO2 20 (L) 08/26/2019 0801   CO2 28 06/06/2014 1102   GLUCOSE 207 (H) 08/26/2019 0801   GLUCOSE 349 (H) 06/06/2014 1102   BUN 51 (H) 08/26/2019 0801   BUN 17 06/06/2014 1102   CREATININE 3.84 (H) 08/26/2019 0801   CREATININE 1.63 (H) 06/06/2014 1102   CALCIUM 8.6 (L) 08/26/2019 0801   CALCIUM 9.2 06/06/2014 1102   PROT 7.4 08/26/2019 0801   PROT 8.2 06/06/2014 1102   ALBUMIN 4.0 08/26/2019 0801   ALBUMIN 3.3 (L) 06/06/2014 1102   AST 18 08/26/2019 0801   AST 7 (L) 06/06/2014 1102   ALT 16 08/26/2019 0801   ALT 12 (L) 06/06/2014 1102   ALKPHOS 57 08/26/2019 0801   ALKPHOS 74 06/06/2014 1102   BILITOT 0.7 08/26/2019 0801   BILITOT 0.4 06/06/2014 1102   GFRNONAA 12 (L)  08/26/2019 0801   GFRNONAA 35 (L) 06/06/2014 1102   GFRAA 14 (L) 08/26/2019 0801   GFRAA 42 (L) 06/06/2014 1102    No results found for: SPEP, UPEP  Lab Results  Component Value Date   WBC 9.3 08/26/2019   NEUTROABS 7.5 08/26/2019   HGB 9.5 (L) 08/26/2019   HCT 28.5 (L) 08/26/2019   MCV 96.0 08/26/2019   PLT 286 08/26/2019      Chemistry      Component Value Date/Time   NA 131 (L) 08/26/2019 0801   NA 130 (L) 06/06/2014 1102   K 4.1 08/26/2019 0801   K 3.9 06/06/2014 1102   CL 100 08/26/2019 0801   CL 95 (L) 06/06/2014 1102   CO2 20 (L) 08/26/2019 0801   CO2 28 06/06/2014 1102   BUN 51 (H) 08/26/2019 0801   BUN 17 06/06/2014 1102   CREATININE 3.84 (H) 08/26/2019 0801   CREATININE 1.63 (H) 06/06/2014 1102      Component Value Date/Time   CALCIUM 8.6 (L) 08/26/2019 0801   CALCIUM 9.2 06/06/2014 1102   ALKPHOS 57 08/26/2019 0801   ALKPHOS 74 06/06/2014 1102   AST 18 08/26/2019 0801   AST 7 (L) 06/06/2014 1102   ALT 16 08/26/2019 0801   ALT 12 (L) 06/06/2014 1102   BILITOT 0.7 08/26/2019 0801   BILITOT 0.4 06/06/2014 1102       RADIOGRAPHIC STUDIES: I have personally reviewed the radiological images as listed and agreed with the findings in the report. No results found.   ASSESSMENT & PLAN:  Carcinoma of upper-inner quadrant of left breast in female, estrogen receptor positive ([place]) Left breast cancer- stage IV- ER/PR +, HER-2/neu. Currently on eribulin [renally dosed [start at 1 mg/m] started on-04/29/2019.   PET scan JAN 5th, 2020-Partial response/ uptake pleural-based lesion/lung lesions/left breast mass. STABLE; but tumor marker rising.   #Proceed with eribulin.cycle number 5-day 8  Labs today reviewed;  acceptable for treatment today.   I again reviewed the discrepancy between rising tumor markers and Stable PET scan.   # Bone mets-sclerotic; Right acetabular uptake-s/p radiation. Hypocalcemia-ca-8.3/low vitD- on vit D daily.  HOLD off Bisphosphonates/  denosumab.   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 Will discuss with Dr.Kolluru.   # Anemia- hemoglobin today-9.2; STABLE. On PO iron.   # # Palliative care evaluation: Introduced palliative care philosophy and services. I discussed the need  for palliative care evaluation/symptom management to help quality of life in the context of incurable disease.  Patient is reluctant; will hold off for now; patient will let us know.  # DISPOSITION:  #  ERIBULIN today # Follow up in 2 weeks; /MD-labs- cbc/cmp-ca-27-29; Eribulin; - Dr.B   No orders of the defined types were placed in this encounter.  All questions were answered. The patient knows to call the clinic with any problems, questions or concerns.      Cammie Sickle, MD 08/26/2019 10:47 AM

## 2019-08-26 NOTE — Assessment & Plan Note (Addendum)
Left breast cancer- stage IV- ER/PR +, HER-2/neu. Currently on eribulin [renally dosed [start at 1 mg/m] started on-04/29/2019.   PET scan JAN 5th, 2020-Partial response/ uptake pleural-based lesion/lung lesions/left breast mass. STABLE; but tumor marker rising.   #Proceed with eribulin.cycle number 5-day 8  Labs today reviewed;  acceptable for treatment today.   I again reviewed the discrepancy between rising tumor markers and Stable PET scan.   # Bone mets-sclerotic; Right acetabular uptake-s/p radiation. Hypocalcemia-ca-8.3/low vitD- on vit D daily.  HOLD off Bisphosphonates/ denosumab.   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 Will discuss with Dr.Kolluru.   # Anemia- hemoglobin today-9.2; STABLE. On PO iron.   # # Palliative care evaluation: Introduced palliative care philosophy and services. I discussed the need for palliative care evaluation/symptom management to help quality of life in the context of incurable disease.  Patient is reluctant; will hold off for now; patient will let us know.  # DISPOSITION:  #  ERIBULIN today # Follow up in 2 weeks; /MD-labs- cbc/cmp-ca-27-29; Eribulin; - Dr.B

## 2019-08-27 LAB — CANCER ANTIGEN 27.29: CA 27.29: 111.8 U/mL — ABNORMAL HIGH (ref 0.0–38.6)

## 2019-09-08 ENCOUNTER — Other Ambulatory Visit: Payer: Self-pay

## 2019-09-08 ENCOUNTER — Other Ambulatory Visit: Payer: Self-pay | Admitting: *Deleted

## 2019-09-08 ENCOUNTER — Encounter: Payer: Self-pay | Admitting: Internal Medicine

## 2019-09-08 NOTE — Progress Notes (Signed)
Patient pre screened for office appointment, no questions or concerns today. Patient reminded of upcoming appointment time and date. 

## 2019-09-09 ENCOUNTER — Inpatient Hospital Stay: Payer: Medicare HMO

## 2019-09-09 ENCOUNTER — Encounter: Payer: Self-pay | Admitting: Internal Medicine

## 2019-09-09 ENCOUNTER — Inpatient Hospital Stay: Payer: Medicare HMO | Attending: Internal Medicine

## 2019-09-09 ENCOUNTER — Inpatient Hospital Stay (HOSPITAL_BASED_OUTPATIENT_CLINIC_OR_DEPARTMENT_OTHER): Payer: Medicare HMO | Admitting: Internal Medicine

## 2019-09-09 ENCOUNTER — Other Ambulatory Visit: Payer: Self-pay

## 2019-09-09 VITALS — BP 107/66 | HR 67 | Resp 20

## 2019-09-09 DIAGNOSIS — Z923 Personal history of irradiation: Secondary | ICD-10-CM | POA: Insufficient documentation

## 2019-09-09 DIAGNOSIS — Z170 Estrogen receptor positive status [ER+]: Secondary | ICD-10-CM | POA: Diagnosis not present

## 2019-09-09 DIAGNOSIS — D649 Anemia, unspecified: Secondary | ICD-10-CM | POA: Diagnosis not present

## 2019-09-09 DIAGNOSIS — F418 Other specified anxiety disorders: Secondary | ICD-10-CM | POA: Insufficient documentation

## 2019-09-09 DIAGNOSIS — Z5111 Encounter for antineoplastic chemotherapy: Secondary | ICD-10-CM | POA: Diagnosis not present

## 2019-09-09 DIAGNOSIS — C7951 Secondary malignant neoplasm of bone: Secondary | ICD-10-CM | POA: Insufficient documentation

## 2019-09-09 DIAGNOSIS — Z79899 Other long term (current) drug therapy: Secondary | ICD-10-CM | POA: Diagnosis not present

## 2019-09-09 DIAGNOSIS — K5900 Constipation, unspecified: Secondary | ICD-10-CM | POA: Diagnosis not present

## 2019-09-09 DIAGNOSIS — Z87891 Personal history of nicotine dependence: Secondary | ICD-10-CM | POA: Diagnosis not present

## 2019-09-09 DIAGNOSIS — C50212 Malignant neoplasm of upper-inner quadrant of left female breast: Secondary | ICD-10-CM | POA: Insufficient documentation

## 2019-09-09 DIAGNOSIS — M79604 Pain in right leg: Secondary | ICD-10-CM | POA: Diagnosis not present

## 2019-09-09 DIAGNOSIS — Z7982 Long term (current) use of aspirin: Secondary | ICD-10-CM | POA: Insufficient documentation

## 2019-09-09 DIAGNOSIS — R5383 Other fatigue: Secondary | ICD-10-CM | POA: Diagnosis not present

## 2019-09-09 DIAGNOSIS — R5381 Other malaise: Secondary | ICD-10-CM | POA: Insufficient documentation

## 2019-09-09 DIAGNOSIS — N184 Chronic kidney disease, stage 4 (severe): Secondary | ICD-10-CM | POA: Insufficient documentation

## 2019-09-09 DIAGNOSIS — R600 Localized edema: Secondary | ICD-10-CM | POA: Diagnosis not present

## 2019-09-09 DIAGNOSIS — M79605 Pain in left leg: Secondary | ICD-10-CM | POA: Diagnosis not present

## 2019-09-09 DIAGNOSIS — Z7951 Long term (current) use of inhaled steroids: Secondary | ICD-10-CM | POA: Insufficient documentation

## 2019-09-09 DIAGNOSIS — I129 Hypertensive chronic kidney disease with stage 1 through stage 4 chronic kidney disease, or unspecified chronic kidney disease: Secondary | ICD-10-CM | POA: Diagnosis not present

## 2019-09-09 DIAGNOSIS — Z8673 Personal history of transient ischemic attack (TIA), and cerebral infarction without residual deficits: Secondary | ICD-10-CM | POA: Insufficient documentation

## 2019-09-09 DIAGNOSIS — E1122 Type 2 diabetes mellitus with diabetic chronic kidney disease: Secondary | ICD-10-CM | POA: Insufficient documentation

## 2019-09-09 DIAGNOSIS — Z7189 Other specified counseling: Secondary | ICD-10-CM

## 2019-09-09 DIAGNOSIS — R140 Abdominal distension (gaseous): Secondary | ICD-10-CM | POA: Insufficient documentation

## 2019-09-09 DIAGNOSIS — Z794 Long term (current) use of insulin: Secondary | ICD-10-CM | POA: Insufficient documentation

## 2019-09-09 DIAGNOSIS — Z95828 Presence of other vascular implants and grafts: Secondary | ICD-10-CM

## 2019-09-09 LAB — COMPREHENSIVE METABOLIC PANEL
ALT: 17 U/L (ref 0–44)
AST: 24 U/L (ref 15–41)
Albumin: 3.9 g/dL (ref 3.5–5.0)
Alkaline Phosphatase: 60 U/L (ref 38–126)
Anion gap: 10 (ref 5–15)
BUN: 33 mg/dL — ABNORMAL HIGH (ref 8–23)
CO2: 22 mmol/L (ref 22–32)
Calcium: 8.3 mg/dL — ABNORMAL LOW (ref 8.9–10.3)
Chloride: 102 mmol/L (ref 98–111)
Creatinine, Ser: 3.07 mg/dL — ABNORMAL HIGH (ref 0.44–1.00)
GFR calc Af Amer: 18 mL/min — ABNORMAL LOW (ref >60)
GFR calc non Af Amer: 16 mL/min — ABNORMAL LOW (ref >60)
Glucose, Bld: 80 mg/dL (ref 70–99)
Potassium: 4.3 mmol/L (ref 3.5–5.1)
Sodium: 134 mmol/L — ABNORMAL LOW (ref 135–145)
Total Bilirubin: 0.6 mg/dL (ref 0.3–1.2)
Total Protein: 7.2 g/dL (ref 6.5–8.1)

## 2019-09-09 LAB — CBC WITH DIFFERENTIAL/PLATELET
Abs Immature Granulocytes: 0.22 10*3/uL — ABNORMAL HIGH (ref 0.00–0.07)
Basophils Absolute: 0.1 10*3/uL (ref 0.0–0.1)
Basophils Relative: 1 %
Eosinophils Absolute: 0.1 10*3/uL (ref 0.0–0.5)
Eosinophils Relative: 1 %
HCT: 30.5 % — ABNORMAL LOW (ref 36.0–46.0)
Hemoglobin: 9.9 g/dL — ABNORMAL LOW (ref 12.0–15.0)
Immature Granulocytes: 3 %
Lymphocytes Relative: 18 %
Lymphs Abs: 1.3 10*3/uL (ref 0.7–4.0)
MCH: 31.9 pg (ref 26.0–34.0)
MCHC: 32.5 g/dL (ref 30.0–36.0)
MCV: 98.4 fL (ref 80.0–100.0)
Monocytes Absolute: 1.3 10*3/uL — ABNORMAL HIGH (ref 0.1–1.0)
Monocytes Relative: 18 %
Neutro Abs: 4.3 10*3/uL (ref 1.7–7.7)
Neutrophils Relative %: 59 %
Platelets: 304 10*3/uL (ref 150–400)
RBC: 3.1 MIL/uL — ABNORMAL LOW (ref 3.87–5.11)
RDW: 13.2 % (ref 11.5–15.5)
WBC: 7.3 10*3/uL (ref 4.0–10.5)
nRBC: 0.3 % — ABNORMAL HIGH (ref 0.0–0.2)

## 2019-09-09 MED ORDER — SODIUM CHLORIDE 0.9 % IV SOLN
2.0000 mg | Freq: Once | INTRAVENOUS | Status: AC
  Administered 2019-09-09: 2 mg via INTRAVENOUS
  Filled 2019-09-09: qty 4

## 2019-09-09 MED ORDER — SODIUM CHLORIDE 0.9 % IV SOLN
Freq: Once | INTRAVENOUS | Status: AC
  Filled 2019-09-09: qty 250

## 2019-09-09 MED ORDER — HEPARIN SOD (PORK) LOCK FLUSH 100 UNIT/ML IV SOLN
500.0000 [IU] | Freq: Once | INTRAVENOUS | Status: AC | PRN
  Administered 2019-09-09: 11:00:00 500 [IU]
  Filled 2019-09-09: qty 5

## 2019-09-09 MED ORDER — PROCHLORPERAZINE MALEATE 10 MG PO TABS
10.0000 mg | ORAL_TABLET | Freq: Once | ORAL | Status: AC
  Administered 2019-09-09: 10 mg via ORAL
  Filled 2019-09-09: qty 1

## 2019-09-09 MED ORDER — HEPARIN SOD (PORK) LOCK FLUSH 100 UNIT/ML IV SOLN
INTRAVENOUS | Status: AC
  Filled 2019-09-09: qty 5

## 2019-09-09 MED ORDER — SODIUM CHLORIDE 0.9% FLUSH
10.0000 mL | Freq: Once | INTRAVENOUS | Status: AC
  Administered 2019-09-09: 10 mL via INTRAVENOUS
  Filled 2019-09-09: qty 10

## 2019-09-09 NOTE — Progress Notes (Signed)
[place] OFFICE PROGRESS NOTE  Patient Care Team: [name], [name], MD as Medical Oncologist (Medical Oncology) Corey Skains, MD as Consulting Physician (Cardiology)  Cancer Staging No matching staging information was found for the patient.   Oncology History Overview Note  # OCT 2015-STAGE IV LEFT BREAST T2N1 [T=4cm; [name]; [name]; [name]; EBUS- Positive Paratrac/subcarinal LN s/p ? Taxotere [in [place]; [name]; [name];[name]; [name]; suv 3]; [name]; [name]; [name];  [name]; Ant R iliac bone; [name]; [name]; START-Taxol weekly. March 2020- Taxol every 2 weeks [PN]; SEP 2020- PET progression  # SEP 04/29/2019- ERIBULIN s/p RT - Right hip- [s/p RT- NOV 2020]  # Poorly controlled Blood sugars- improved.   # Pancreatitis Hx/PEI- on creon in past / CKD IV [creat ~ 3-4; [name]; [name]; mild left sided weakness]  # Jan 2020-  Lobular lesion on tongue- s/p excision pyogenic granuloma [Dr.McQueen]   # GENETIC TESTING/COUNSELLING: HETEROZYGOUS Cystic Fibrosis Gene [explains hx of recurrent pancreatitis]  # MOLECULAR TESTING: NA   # PALLIATIVE CARE: 1/22-Discussed/Declined ------------------------------------------------   DIAGNOSIS: [ 2015] BREAST CA; [name]; [name] ;GOALS: Palliative  CURRENT/MOST RECENT THERAPY: ERIBULIN [C].     Carcinoma of upper-inner quadrant of left  breast in female, estrogen receptor positive ([place])  04/29/2019 -  Chemotherapy   The patient had eriBULin mesylate (HALAVEN) 2 mg in sodium chloride 0.9 % 100 mL chemo infusion, 2 mg, Intravenous,  Once, 6 of 7 cycles Dose modification: 1 mg/m2 (original dose 1 mg/m2, Cycle 1, Reason: Provider Judgment) Administration: 2 mg (06/03/2019), 2 mg (04/29/2019), 2 mg (06/10/2019), 2 mg (07/04/2019), 2 mg (07/11/2019), 2 mg (07/25/2019), 2 mg (08/03/2019), 2 mg (08/19/2019), 2 mg (08/26/2019)  for chemotherapy treatment.     INTERVAL HISTORY:  [name] 64 y.o.  female pleasant patient above history of metastatic ER PR positive HER-2 negative breast cancer-currently on eribulin  is here for follow-up.  Patient denies any worsening shortness of breath or cough.  Continues to have chronic mild joint pains not any worse.  Denies any worsening tingling numbness.  Review of Systems  Constitutional: Positive for malaise/fatigue. Negative for chills, diaphoresis, [name], hemoptysis, sputum production, [name], palpitations and orthopnea.  Gastrointestinal: Negative for abdominal pain, blood in stool, constipation, diarrhea, heartburn, melena, nausea and vomiting.  Genitourinary: Negative for dysuria, frequency and urgency.  Musculoskeletal: Negative for back pain and joint pain.  Skin: Negative.  Negative for itching and rash.  Neurological: Positive for tingling. Negative for dizziness, focal weakness, weakness and headaches.  Endo/Heme/Allergies: Does not bruise/bleed easily.  Psychiatric/Behavioral: Negative for depression. The patient is not nervous/anxious and does not have insomnia.     PAST MEDICAL HISTORY :  Past Medical History:  Diagnosis Date  . Anemia   .  Anxiety   . Asthma   . Cancer  ([place]) 03/10/2018   Per [facility] PET order. Carcinoma of upper-inner quadrant of left breast in female, estrogen receptor positive .  Marland Kitchen Cancer (HCC)    LUNG  . CHF (congestive heart failure) ([place]) 1997  . CKD (chronic kidney disease)   . Depression   . Diabetes mellitus, type 2 ([place])   . Family history of breast cancer   . Family history of colon cancer   . Family history of ovarian cancer   . Family history of pancreatic cancer   . Family history of prostate cancer   . Family history of stomach cancer   . GERD (gastroesophageal reflux disease)    history of an ulcer  . Hair loss   . History of left breast cancer 05/29/14  . History of partial hysterectomy 12/31/2016   Per patient.  Has not had a period in years.  Had a partial hysterectomy years ago.  Marland Kitchen Hypertension   . Mitral valve regurgitation   . Neuromuscular disorder (HCC)    neuropathies in hand  . Obesity   . Pancreatitis 1997  . Stroke [facility]) 2010   with mild left arm weakness    PAST SURGICAL HISTORY :   Past Surgical History:  Procedure Laterality Date  . CATARACT EXTRACTION W/PHACO Right 02/24/2019   Procedure: CATARACT EXTRACTION PHACO AND INTRAOCULAR LENS PLACEMENT ([place]) RIGHT DIABETES;  Surgeon: Marchia Meiers, MD;  Location: [facility] ORS;  Service: Ophthalmology;  Laterality: Right;  [place] 01:13.0 CDE 7.96 Fluid Pack Lot # [id number]  . CATARACT EXTRACTION W/PHACO Left 03/24/2019   Procedure: CATARACT EXTRACTION PHACO AND INTRAOCULAR LENS PLACEMENT ([facility]) - left diabetic;  Surgeon: Marchia Meiers, MD;  Location: [facility] ORS;  Service: Ophthalmology;  Laterality: Left;  [place]  01:36 CDE 13.93 Fluid pack lot # [id number]  . CESAREAN SECTION    . CHOLECYSTECTOMY    . EXCISION OF TONGUE LESION N/A 08/17/2018   Procedure: EXCISION OF TONGUE LESION WITH FROZEN SECTIONS;  Surgeon: Beverly Gust, MD;  Location: [facility] ORS;  Service: ENT;  Laterality: N/A;  . EYE SURGERY Right    cataract extraction  . PARTIAL HYSTERECTOMY  12/31/2016   Per  patient, she has not had a period in years since she had a partial hysterectomy.  Marland Kitchen PORTA CATH INSERTION    . TUBAL LIGATION      FAMILY HISTORY :   Family History  Problem Relation Age of Onset  . Ovarian cancer Mother 72  . Diabetes Mother   . Hypertension Mother   . COPD Father   . Hypertension Father   . Colon cancer Father 2  . Diabetes Sister   . Breast cancer Sister 66       bilateral  . Diabetes Brother   . Leukemia Maternal Aunt   . Pancreatic cancer Paternal Aunt 13  . Pancreatic cancer Paternal Uncle   . Colon cancer Paternal Uncle   . Stomach cancer Maternal Grandfather 12  . Throat cancer Paternal Grandmother   . Breast cancer Maternal Aunt 80  . Colon cancer Maternal Aunt   . Bone cancer Maternal Aunt   . Breast cancer Paternal Aunt        dx >50  . Prostate cancer Paternal Uncle   . Pancreatic cancer Paternal Uncle   . Throat cancer Paternal Uncle   . Lung cancer Paternal Uncle   . Stomach cancer Paternal Uncle   . Brain cancer Paternal Aunt   . Cancer  Cousin        liver, kidney  . Prostate cancer Cousin        meastatic  . Lung cancer Other     SOCIAL HISTORY:   Social History   Tobacco Use  . Smoking status: Former Smoker    Packs/day: 0.50    Years: 1.00    Pack years: 0.50    Types: Cigarettes  . Smokeless tobacco: Never Used  Substance Use Topics  . Alcohol use: No    Alcohol/week: 0.0 standard drinks  . Drug use: No    ALLERGIES:  has No Known Allergies.  MEDICATIONS:  Current Outpatient Medications  Medication Sig Dispense Refill  . acetaminophen-codeine (TYLENOL #4) 300-60 MG tablet Take 1-2 tablets by mouth every 4 (four) hours as needed for moderate pain.     Marland Kitchen albuterol (PROAIR HFA) 108 (90 BASE) MCG/ACT inhaler Inhale 2 puffs into the lungs every 6 (six) hours as needed for wheezing or shortness of breath.     Marland Kitchen albuterol (PROVENTIL) (2.5 MG/3ML) 0.083% nebulizer solution Inhale 2.5 mg into the lungs every 6 (six) hours as  needed for shortness of breath.     . ALPRAZolam (XANAX) 0.5 MG tablet Take 0.5 mg by mouth at bedtime as needed for anxiety or sleep.     Marland Kitchen amLODipine (NORVASC) 10 MG tablet Take 10 mg by mouth daily.     Marland Kitchen aspirin EC 81 MG tablet Take 81 mg by mouth daily.     Marland Kitchen atenolol (TENORMIN) 100 MG tablet Take 100 mg by mouth 2 (two) times daily.     . B-D ULTRA-FINE 33 LANCETS MISC Use 1 each 2 (two) times daily.    . B-D ULTRAFINE III SHORT PEN 31G X 8 MM MISC     . bumetanide (BUMEX) 0.5 MG tablet Take 0.5 mg by mouth 2 (two) times daily.     . calcitRIOL (ROCALTROL) 0.25 MCG capsule Take 0.25 mcg by mouth every Monday, Wednesday, and Friday.     . Cinnamon 500 MG capsule Take 1,000 mg by mouth daily.     . cloNIDine (CATAPRES) 0.2 MG tablet Take 0.2 mg by mouth 2 (two) times daily.     . diflorasone (PSORCON) 0.05 % ointment     . enalapril (VASOTEC) 10 MG tablet Take 20 mg by mouth 2 (two) times a day.     . famotidine (PEPCID) 20 MG tablet Take 20 mg by mouth 2 (two) times daily.    . ferrous sulfate 325 (65 FE) MG tablet Take 325 mg by mouth 2 (two) times daily with a meal.    . FLUoxetine (PROZAC) 20 MG capsule Take 20 mg by mouth 2 (two) times daily.     . Fluticasone Propionate, Inhal, (FLOVENT DISKUS) 100 MCG/BLIST AEPB Inhale 2 puffs into the lungs 2 (two) times a day. Inhale 2 inhalations into the lungs 2 times daily PRN     . gabapentin (NEURONTIN) 100 MG capsule TAKE 1 CAPSULE(100 MG) BY MOUTH AT BEDTIME (Patient taking differently: Take 100 mg by mouth at bedtime. ) 30 capsule 6  . glucose blood (ONE TOUCH ULTRA TEST) test strip Use 1 each 2 (two) times daily. Use as instructed.    . glyBURIDE (DIABETA) 5 MG tablet Take 10 mg by mouth 2 (two) times daily with a meal.     . LEVEMIR FLEXTOUCH 100 UNIT/ML Pen Inject 55 Units into the skin daily.     Marland Kitchen loperamide (IMODIUM A-D) 2 MG  capsule Take 2-4 mg by mouth 4 (four) times daily as needed for diarrhea or loose stools.     . mometasone  (NASONEX) 50 MCG/ACT nasal spray Place 2 sprays into the nose daily as needed (Allergies).     . NOVOLOG FLEXPEN 100 UNIT/ML FlexPen Inject 7 Units into the skin 2 (two) times daily.     . ondansetron (ZOFRAN) 8 MG tablet One pill every 8 hours as needed for nausea/vomitting. (Patient taking differently: Take 8 mg by mouth every 8 (eight) hours as needed for nausea or vomiting. ) 40 tablet 1  . oxyCODONE-acetaminophen (PERCOCET/ROXICET) 5-325 MG tablet Take 1 tablet by mouth every 6 (six) hours as needed for moderate pain.     . prednisoLONE acetate (PRED FORTE) 1 % ophthalmic suspension Place 1 drop into the left eye 2 (two) times daily.    . prochlorperazine (COMPAZINE) 10 MG tablet Take 1 tablet (10 mg total) by mouth every 6 (six) hours as needed for nausea or vomiting. Please note change in strength (Patient taking differently: Take 10 mg by mouth every 6 (six) hours as needed for nausea or vomiting. ) 60 tablet 4  . salmeterol (SEREVENT) 50 MCG/DOSE diskus inhaler Inhale 1 puff into the lungs daily as needed (shortness of breath).     . simvastatin (ZOCOR) 20 MG tablet Take 20 mg by mouth daily at 6 PM.     . vitamin B-12 (CYANOCOBALAMIN) 1000 MCG tablet Take 1,000 mcg by mouth daily.     No current facility-administered medications for this visit.   Facility-Administered Medications Ordered in Other Visits  Medication Dose Route Frequency Provider Last Rate Last Admin  . eriBULin mesylate (HALAVEN) 2 mg in sodium chloride 0.9 % 100 mL chemo infusion  2 mg Intravenous Once Charlaine Dalton R, MD      . heparin lock flush 100 unit/mL  500 Units Intracatheter Once PRN Charlaine Dalton R, MD      . sodium chloride flush (NS) 0.9 % injection 10 mL  10 mL Intravenous PRN Cammie Sickle, MD   10 mL at 01/30/16 1054    PHYSICAL EXAMINATION: ECOG PERFORMANCE STATUS: 1 - Symptomatic but completely ambulatory  BP (!) 164/91 (BP Location: Left Arm, Patient Position: Sitting, Cuff Size:  Large)   Pulse 76   Temp (!) 96.9 F (36.1 C) (Tympanic)   Wt 198 lb 2 oz (89.9 kg)   BMI 35.10 kg/m   Filed Weights   09/09/19 0845  Weight: 198 lb 2 oz (89.9 kg)    Physical Exam  Constitutional: She is oriented to person, place, and time and well-developed, well-nourished, and in no distress.  She is alone.  HENT:  Head: Normocephalic and atraumatic.  Mouth/Throat: Oropharynx is clear and moist. No oropharyngeal exudate.  Eyes: Pupils are equal, round, and reactive to light.  Cardiovascular: Normal rate and regular rhythm.  Pulmonary/Chest: Breath sounds normal. No respiratory distress. She has no wheezes.  Abdominal: Soft. Bowel sounds are normal. She exhibits no distension and no mass. There is no abdominal tenderness. There is no rebound and no guarding.  Musculoskeletal:        General: Edema present. No tenderness. Normal range of motion.     Cervical back: Normal range of motion and neck supple.  Neurological: She is alert and oriented to person, place, and time.  Skin: Skin is warm.  Right thumb bandaged-wound healing.  No signs of infection or pus.  Psychiatric: Affect normal.    LABORATORY DATA:  I have reviewed the data as listed    Component Value Date/Time   NA 134 (L) 09/09/2019 0815   NA 130 (L) 06/06/2014 1102   K 4.3 09/09/2019 0815   K 3.9 06/06/2014 1102   CL 102 09/09/2019 0815   CL 95 (L) 06/06/2014 1102   CO2 22 09/09/2019 0815   CO2 28 06/06/2014 1102   GLUCOSE 80 09/09/2019 0815   GLUCOSE 349 (H) 06/06/2014 1102   BUN 33 (H) 09/09/2019 0815   BUN 17 06/06/2014 1102   CREATININE 3.07 (H) 09/09/2019 0815   CREATININE 1.63 (H) 06/06/2014 1102   CALCIUM 8.3 (L) 09/09/2019 0815   CALCIUM 9.2 06/06/2014 1102   PROT 7.2 09/09/2019 0815   PROT 8.2 06/06/2014 1102   ALBUMIN 3.9 09/09/2019 0815   ALBUMIN 3.3 (L) 06/06/2014 1102   AST 24 09/09/2019 0815   AST 7 (L) 06/06/2014 1102   ALT 17 09/09/2019 0815   ALT 12 (L) 06/06/2014 1102    ALKPHOS 60 09/09/2019 0815   ALKPHOS 74 06/06/2014 1102   BILITOT 0.6 09/09/2019 0815   BILITOT 0.4 06/06/2014 1102   GFRNONAA 16 (L) 09/09/2019 0815   GFRNONAA 35 (L) 06/06/2014 1102   GFRAA 18 (L) 09/09/2019 0815   GFRAA 42 (L) 06/06/2014 1102    No results found for: SPEP, UPEP  Lab Results  Component Value Date   WBC 7.3 09/09/2019   NEUTROABS 4.3 09/09/2019   HGB 9.9 (L) 09/09/2019   HCT 30.5 (L) 09/09/2019   MCV 98.4 09/09/2019   PLT 304 09/09/2019      Chemistry      Component Value Date/Time   NA 134 (L) 09/09/2019 0815   NA 130 (L) 06/06/2014 1102   K 4.3 09/09/2019 0815   K 3.9 06/06/2014 1102   CL 102 09/09/2019 0815   CL 95 (L) 06/06/2014 1102   CO2 22 09/09/2019 0815   CO2 28 06/06/2014 1102   BUN 33 (H) 09/09/2019 0815   BUN 17 06/06/2014 1102   CREATININE 3.07 (H) 09/09/2019 0815   CREATININE 1.63 (H) 06/06/2014 1102      Component Value Date/Time   CALCIUM 8.3 (L) 09/09/2019 0815   CALCIUM 9.2 06/06/2014 1102   ALKPHOS 60 09/09/2019 0815   ALKPHOS 74 06/06/2014 1102   AST 24 09/09/2019 0815   AST 7 (L) 06/06/2014 1102   ALT 17 09/09/2019 0815   ALT 12 (L) 06/06/2014 1102   BILITOT 0.6 09/09/2019 0815   BILITOT 0.4 06/06/2014 1102       RADIOGRAPHIC STUDIES: I have personally reviewed the radiological images as listed and agreed with the findings in the report. No results found.   ASSESSMENT & PLAN:  Carcinoma of upper-inner quadrant of left breast in female, estrogen receptor positive ([place]) Left breast cancer- stage IV- ER/PR +, HER-2/neu. Currently on eribulin [renally dosed [start at 1 mg/m] started on-04/29/2019.   PET scan JAN 5th, 2020-Partial response/ uptake pleural-based lesion/lung lesions/left breast mass. STABLE; but tumor marker rising.   #Proceed with eribulin.cycle number 6-day 1  Labs today reviewed;  acceptable for treatment today.   # Bone mets-sclerotic; Right acetabular uptake-s/p radiation. Hypocalcemia-ca-8.3/low  vitD- on vit D daily. STABLE;  HOLD off Bisphosphonates/ denosumab.   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   # Anemia- hemoglobin today-9.2; STABLE;  On PO iron.  # DISPOSITION:  #  ERIBULIN today #  1 week- labs- cbc/bmp-Eribulin # Follow up in 3 weeks; /MD-labs- cbc/cmp-ca-27-29; Eribulin; - Dr.B   No orders of the defined types were placed in this encounter.  All questions were answered. The patient knows to call the clinic with any problems, questions or concerns.      Cammie Sickle, MD 09/09/2019 10:26 AM

## 2019-09-09 NOTE — Assessment & Plan Note (Addendum)
Left breast cancer- stage IV- ER/PR +, HER-2/neu. Currently on eribulin [renally dosed [start at 1 mg/m] started on-04/29/2019.   PET scan JAN 5th, 2020-Partial response/ uptake pleural-based lesion/lung lesions/left breast mass. STABLE; but tumor marker rising.   #Proceed with eribulin.cycle number 6-day 1  Labs today reviewed;  acceptable for treatment today.   # Bone mets-sclerotic; Right acetabular uptake-s/p radiation. Hypocalcemia-ca-8.3/low vitD- on vit D daily. STABLE;  HOLD off Bisphosphonates/ denosumab.   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   # Anemia- hemoglobin today-9.2; STABLE;  On PO iron.  # DISPOSITION:  #  ERIBULIN today # 1 week- labs- cbc/bmp-Eribulin # Follow up in 3 weeks; /MD-labs- ACL/JQW-[id number]-28-54; Eribulin; - Dr.B

## 2019-09-10 LAB — CANCER ANTIGEN 27.29: CA 27.29: 104.3 U/mL — ABNORMAL HIGH (ref 0.0–38.6)

## 2019-09-15 ENCOUNTER — Other Ambulatory Visit: Payer: Self-pay

## 2019-09-16 ENCOUNTER — Other Ambulatory Visit: Payer: Self-pay

## 2019-09-16 ENCOUNTER — Inpatient Hospital Stay: Payer: Medicare HMO

## 2019-09-16 VITALS — BP 136/79 | HR 65 | Temp 95.9°F | Resp 20 | Wt 196.0 lb

## 2019-09-16 DIAGNOSIS — Z5111 Encounter for antineoplastic chemotherapy: Secondary | ICD-10-CM | POA: Diagnosis not present

## 2019-09-16 DIAGNOSIS — D649 Anemia, unspecified: Secondary | ICD-10-CM | POA: Diagnosis not present

## 2019-09-16 DIAGNOSIS — Z170 Estrogen receptor positive status [ER+]: Secondary | ICD-10-CM | POA: Diagnosis not present

## 2019-09-16 DIAGNOSIS — Z923 Personal history of irradiation: Secondary | ICD-10-CM | POA: Diagnosis not present

## 2019-09-16 DIAGNOSIS — C50212 Malignant neoplasm of upper-inner quadrant of left female breast: Secondary | ICD-10-CM

## 2019-09-16 DIAGNOSIS — R5381 Other malaise: Secondary | ICD-10-CM | POA: Diagnosis not present

## 2019-09-16 DIAGNOSIS — C7951 Secondary malignant neoplasm of bone: Secondary | ICD-10-CM | POA: Diagnosis not present

## 2019-09-16 DIAGNOSIS — Z7189 Other specified counseling: Secondary | ICD-10-CM

## 2019-09-16 DIAGNOSIS — R5383 Other fatigue: Secondary | ICD-10-CM | POA: Diagnosis not present

## 2019-09-16 LAB — COMPREHENSIVE METABOLIC PANEL
ALT: 25 U/L (ref 0–44)
AST: 27 U/L (ref 15–41)
Albumin: 3.9 g/dL (ref 3.5–5.0)
Alkaline Phosphatase: 56 U/L (ref 38–126)
Anion gap: 10 (ref 5–15)
BUN: 36 mg/dL — ABNORMAL HIGH (ref 8–23)
CO2: 21 mmol/L — ABNORMAL LOW (ref 22–32)
Calcium: 8.7 mg/dL — ABNORMAL LOW (ref 8.9–10.3)
Chloride: 104 mmol/L (ref 98–111)
Creatinine, Ser: 3.21 mg/dL — ABNORMAL HIGH (ref 0.44–1.00)
GFR calc Af Amer: 17 mL/min — ABNORMAL LOW (ref >60)
GFR calc non Af Amer: 15 mL/min — ABNORMAL LOW (ref >60)
Glucose, Bld: 113 mg/dL — ABNORMAL HIGH (ref 70–99)
Potassium: 4.2 mmol/L (ref 3.5–5.1)
Sodium: 135 mmol/L (ref 135–145)
Total Bilirubin: 0.6 mg/dL (ref 0.3–1.2)
Total Protein: 7.2 g/dL (ref 6.5–8.1)

## 2019-09-16 LAB — CBC WITH DIFFERENTIAL/PLATELET
Abs Immature Granulocytes: 0.36 10*3/uL — ABNORMAL HIGH (ref 0.00–0.07)
Basophils Absolute: 0 10*3/uL (ref 0.0–0.1)
Basophils Relative: 0 %
Eosinophils Absolute: 0 10*3/uL (ref 0.0–0.5)
Eosinophils Relative: 0 %
HCT: 29.5 % — ABNORMAL LOW (ref 36.0–46.0)
Hemoglobin: 9.8 g/dL — ABNORMAL LOW (ref 12.0–15.0)
Immature Granulocytes: 4 %
Lymphocytes Relative: 12 %
Lymphs Abs: 1.1 10*3/uL (ref 0.7–4.0)
MCH: 32 pg (ref 26.0–34.0)
MCHC: 33.2 g/dL (ref 30.0–36.0)
MCV: 96.4 fL (ref 80.0–100.0)
Monocytes Absolute: 0.4 10*3/uL (ref 0.1–1.0)
Monocytes Relative: 4 %
Neutro Abs: 7.3 10*3/uL (ref 1.7–7.7)
Neutrophils Relative %: 80 %
Platelets: 294 10*3/uL (ref 150–400)
RBC: 3.06 MIL/uL — ABNORMAL LOW (ref 3.87–5.11)
RDW: 12.7 % (ref 11.5–15.5)
WBC: 9.3 10*3/uL (ref 4.0–10.5)
nRBC: 0 % (ref 0.0–0.2)

## 2019-09-16 MED ORDER — HEPARIN SOD (PORK) LOCK FLUSH 100 UNIT/ML IV SOLN
INTRAVENOUS | Status: AC
  Filled 2019-09-16: qty 5

## 2019-09-16 MED ORDER — HEPARIN SOD (PORK) LOCK FLUSH 100 UNIT/ML IV SOLN
500.0000 [IU] | Freq: Once | INTRAVENOUS | Status: AC | PRN
  Administered 2019-09-16: 10:00:00 500 [IU]
  Filled 2019-09-16: qty 5

## 2019-09-16 MED ORDER — PROCHLORPERAZINE MALEATE 10 MG PO TABS
10.0000 mg | ORAL_TABLET | Freq: Once | ORAL | Status: AC
  Administered 2019-09-16: 10 mg via ORAL
  Filled 2019-09-16: qty 1

## 2019-09-16 MED ORDER — SODIUM CHLORIDE 0.9% FLUSH
10.0000 mL | Freq: Once | INTRAVENOUS | Status: AC
  Administered 2019-09-16: 10 mL via INTRAVENOUS
  Filled 2019-09-16: qty 10

## 2019-09-16 MED ORDER — SODIUM CHLORIDE 0.9 % IV SOLN
Freq: Once | INTRAVENOUS | Status: AC
  Filled 2019-09-16: qty 250

## 2019-09-16 MED ORDER — SODIUM CHLORIDE 0.9 % IV SOLN
2.0000 mg | Freq: Once | INTRAVENOUS | Status: AC
  Administered 2019-09-16: 2 mg via INTRAVENOUS
  Filled 2019-09-16: qty 4

## 2019-09-19 DIAGNOSIS — R2231 Localized swelling, mass and lump, right upper limb: Secondary | ICD-10-CM | POA: Diagnosis not present

## 2019-09-19 DIAGNOSIS — M170 Bilateral primary osteoarthritis of knee: Secondary | ICD-10-CM | POA: Diagnosis not present

## 2019-09-19 DIAGNOSIS — L089 Local infection of the skin and subcutaneous tissue, unspecified: Secondary | ICD-10-CM | POA: Diagnosis not present

## 2019-09-20 DIAGNOSIS — I340 Nonrheumatic mitral (valve) insufficiency: Secondary | ICD-10-CM | POA: Diagnosis not present

## 2019-09-20 DIAGNOSIS — C50812 Malignant neoplasm of overlapping sites of left female breast: Secondary | ICD-10-CM | POA: Diagnosis not present

## 2019-09-20 DIAGNOSIS — Z823 Family history of stroke: Secondary | ICD-10-CM | POA: Diagnosis not present

## 2019-09-20 DIAGNOSIS — I132 Hypertensive heart and chronic kidney disease with heart failure and with stage 5 chronic kidney disease, or end stage renal disease: Secondary | ICD-10-CM | POA: Diagnosis not present

## 2019-09-20 DIAGNOSIS — C50412 Malignant neoplasm of upper-outer quadrant of left female breast: Secondary | ICD-10-CM | POA: Diagnosis not present

## 2019-09-20 DIAGNOSIS — M25561 Pain in right knee: Secondary | ICD-10-CM | POA: Diagnosis not present

## 2019-09-20 DIAGNOSIS — E1122 Type 2 diabetes mellitus with diabetic chronic kidney disease: Secondary | ICD-10-CM | POA: Diagnosis not present

## 2019-09-20 DIAGNOSIS — N184 Chronic kidney disease, stage 4 (severe): Secondary | ICD-10-CM | POA: Diagnosis not present

## 2019-09-20 DIAGNOSIS — C799 Secondary malignant neoplasm of unspecified site: Secondary | ICD-10-CM | POA: Diagnosis not present

## 2019-09-20 DIAGNOSIS — C50919 Malignant neoplasm of unspecified site of unspecified female breast: Secondary | ICD-10-CM | POA: Diagnosis not present

## 2019-09-20 DIAGNOSIS — S6701XA Crushing injury of right thumb, initial encounter: Secondary | ICD-10-CM | POA: Diagnosis not present

## 2019-09-20 DIAGNOSIS — K861 Other chronic pancreatitis: Secondary | ICD-10-CM | POA: Diagnosis not present

## 2019-09-20 DIAGNOSIS — N185 Chronic kidney disease, stage 5: Secondary | ICD-10-CM | POA: Diagnosis not present

## 2019-09-20 DIAGNOSIS — E118 Type 2 diabetes mellitus with unspecified complications: Secondary | ICD-10-CM | POA: Diagnosis not present

## 2019-09-20 DIAGNOSIS — I639 Cerebral infarction, unspecified: Secondary | ICD-10-CM | POA: Diagnosis not present

## 2019-09-20 DIAGNOSIS — D649 Anemia, unspecified: Secondary | ICD-10-CM | POA: Diagnosis not present

## 2019-09-20 DIAGNOSIS — M25562 Pain in left knee: Secondary | ICD-10-CM | POA: Diagnosis not present

## 2019-09-20 DIAGNOSIS — R3989 Other symptoms and signs involving the genitourinary system: Secondary | ICD-10-CM | POA: Diagnosis not present

## 2019-09-20 DIAGNOSIS — I5022 Chronic systolic (congestive) heart failure: Secondary | ICD-10-CM | POA: Diagnosis not present

## 2019-09-20 DIAGNOSIS — F3340 Major depressive disorder, recurrent, in remission, unspecified: Secondary | ICD-10-CM | POA: Diagnosis not present

## 2019-09-20 DIAGNOSIS — Z79899 Other long term (current) drug therapy: Secondary | ICD-10-CM | POA: Diagnosis not present

## 2019-09-20 DIAGNOSIS — E1165 Type 2 diabetes mellitus with hyperglycemia: Secondary | ICD-10-CM | POA: Diagnosis not present

## 2019-09-20 DIAGNOSIS — G8929 Other chronic pain: Secondary | ICD-10-CM | POA: Diagnosis not present

## 2019-09-20 DIAGNOSIS — F329 Major depressive disorder, single episode, unspecified: Secondary | ICD-10-CM | POA: Diagnosis not present

## 2019-09-20 DIAGNOSIS — I10 Essential (primary) hypertension: Secondary | ICD-10-CM | POA: Diagnosis not present

## 2019-09-27 ENCOUNTER — Inpatient Hospital Stay
Admission: EM | Admit: 2019-09-27 | Discharge: 2019-10-01 | DRG: 640 | Disposition: A | Discharge status: Home Health | Payer: Medicare HMO | Source: Ambulatory Visit | Attending: Internal Medicine | Admitting: Internal Medicine

## 2019-09-27 ENCOUNTER — Encounter: Payer: Self-pay | Admitting: Emergency Medicine

## 2019-09-27 ENCOUNTER — Other Ambulatory Visit: Payer: Self-pay

## 2019-09-27 ENCOUNTER — Encounter: Payer: Self-pay | Admitting: Oncology

## 2019-09-27 ENCOUNTER — Ambulatory Visit
Admission: RE | Admit: 2019-09-27 | Discharge: 2019-09-27 | Disposition: A | Discharge status: Home / Self Care | Payer: Medicare HMO | Source: Ambulatory Visit | Attending: Oncology | Admitting: Oncology

## 2019-09-27 ENCOUNTER — Other Ambulatory Visit: Payer: Self-pay | Admitting: *Deleted

## 2019-09-27 ENCOUNTER — Telehealth: Payer: Self-pay | Admitting: *Deleted

## 2019-09-27 ENCOUNTER — Inpatient Hospital Stay (HOSPITAL_BASED_OUTPATIENT_CLINIC_OR_DEPARTMENT_OTHER): Payer: Medicare HMO | Admitting: Oncology

## 2019-09-27 VITALS — BP 92/63 | HR 64 | Temp 96.9°F | Resp 20 | Ht 63.0 in | Wt 198.0 lb

## 2019-09-27 DIAGNOSIS — R600 Localized edema: Secondary | ICD-10-CM | POA: Insufficient documentation

## 2019-09-27 DIAGNOSIS — M79605 Pain in left leg: Secondary | ICD-10-CM

## 2019-09-27 DIAGNOSIS — N2581 Secondary hyperparathyroidism of renal origin: Secondary | ICD-10-CM | POA: Diagnosis not present

## 2019-09-27 DIAGNOSIS — C7951 Secondary malignant neoplasm of bone: Secondary | ICD-10-CM | POA: Diagnosis not present

## 2019-09-27 DIAGNOSIS — Z170 Estrogen receptor positive status [ER+]: Secondary | ICD-10-CM

## 2019-09-27 DIAGNOSIS — M170 Bilateral primary osteoarthritis of knee: Secondary | ICD-10-CM | POA: Diagnosis not present

## 2019-09-27 DIAGNOSIS — E785 Hyperlipidemia, unspecified: Secondary | ICD-10-CM | POA: Diagnosis present

## 2019-09-27 DIAGNOSIS — K5900 Constipation, unspecified: Secondary | ICD-10-CM | POA: Diagnosis present

## 2019-09-27 DIAGNOSIS — Z20822 Contact with and (suspected) exposure to covid-19: Secondary | ICD-10-CM | POA: Diagnosis present

## 2019-09-27 DIAGNOSIS — E872 Acidosis, unspecified: Secondary | ICD-10-CM

## 2019-09-27 DIAGNOSIS — K219 Gastro-esophageal reflux disease without esophagitis: Secondary | ICD-10-CM | POA: Diagnosis present

## 2019-09-27 DIAGNOSIS — Z79899 Other long term (current) drug therapy: Secondary | ICD-10-CM

## 2019-09-27 DIAGNOSIS — Z89011 Acquired absence of right thumb: Secondary | ICD-10-CM

## 2019-09-27 DIAGNOSIS — M79604 Pain in right leg: Secondary | ICD-10-CM

## 2019-09-27 DIAGNOSIS — I5022 Chronic systolic (congestive) heart failure: Secondary | ICD-10-CM | POA: Diagnosis not present

## 2019-09-27 DIAGNOSIS — Z8673 Personal history of transient ischemic attack (TIA), and cerebral infarction without residual deficits: Secondary | ICD-10-CM

## 2019-09-27 DIAGNOSIS — D631 Anemia in chronic kidney disease: Secondary | ICD-10-CM | POA: Diagnosis present

## 2019-09-27 DIAGNOSIS — I10 Essential (primary) hypertension: Secondary | ICD-10-CM | POA: Diagnosis not present

## 2019-09-27 DIAGNOSIS — J45909 Unspecified asthma, uncomplicated: Secondary | ICD-10-CM | POA: Diagnosis present

## 2019-09-27 DIAGNOSIS — Z87891 Personal history of nicotine dependence: Secondary | ICD-10-CM

## 2019-09-27 DIAGNOSIS — J9811 Atelectasis: Secondary | ICD-10-CM | POA: Diagnosis not present

## 2019-09-27 DIAGNOSIS — R296 Repeated falls: Secondary | ICD-10-CM | POA: Diagnosis present

## 2019-09-27 DIAGNOSIS — Z800 Family history of malignant neoplasm of digestive organs: Secondary | ICD-10-CM

## 2019-09-27 DIAGNOSIS — C50212 Malignant neoplasm of upper-inner quadrant of left female breast: Secondary | ICD-10-CM

## 2019-09-27 DIAGNOSIS — Z7982 Long term (current) use of aspirin: Secondary | ICD-10-CM

## 2019-09-27 DIAGNOSIS — D638 Anemia in other chronic diseases classified elsewhere: Secondary | ICD-10-CM | POA: Diagnosis not present

## 2019-09-27 DIAGNOSIS — R531 Weakness: Secondary | ICD-10-CM | POA: Diagnosis not present

## 2019-09-27 DIAGNOSIS — N179 Acute kidney failure, unspecified: Secondary | ICD-10-CM | POA: Diagnosis not present

## 2019-09-27 DIAGNOSIS — E1122 Type 2 diabetes mellitus with diabetic chronic kidney disease: Secondary | ICD-10-CM | POA: Diagnosis present

## 2019-09-27 DIAGNOSIS — E1142 Type 2 diabetes mellitus with diabetic polyneuropathy: Secondary | ICD-10-CM | POA: Diagnosis present

## 2019-09-27 DIAGNOSIS — C50912 Malignant neoplasm of unspecified site of left female breast: Secondary | ICD-10-CM | POA: Diagnosis present

## 2019-09-27 DIAGNOSIS — T368X5A Adverse effect of other systemic antibiotics, initial encounter: Secondary | ICD-10-CM | POA: Diagnosis present

## 2019-09-27 DIAGNOSIS — E1159 Type 2 diabetes mellitus with other circulatory complications: Secondary | ICD-10-CM

## 2019-09-27 DIAGNOSIS — Z803 Family history of malignant neoplasm of breast: Secondary | ICD-10-CM | POA: Diagnosis not present

## 2019-09-27 DIAGNOSIS — E875 Hyperkalemia: Secondary | ICD-10-CM | POA: Diagnosis not present

## 2019-09-27 DIAGNOSIS — I130 Hypertensive heart and chronic kidney disease with heart failure and stage 1 through stage 4 chronic kidney disease, or unspecified chronic kidney disease: Secondary | ICD-10-CM | POA: Diagnosis present

## 2019-09-27 DIAGNOSIS — F418 Other specified anxiety disorders: Secondary | ICD-10-CM | POA: Diagnosis present

## 2019-09-27 DIAGNOSIS — I152 Hypertension secondary to endocrine disorders: Secondary | ICD-10-CM

## 2019-09-27 DIAGNOSIS — N170 Acute kidney failure with tubular necrosis: Secondary | ICD-10-CM | POA: Diagnosis present

## 2019-09-27 DIAGNOSIS — F419 Anxiety disorder, unspecified: Secondary | ICD-10-CM

## 2019-09-27 DIAGNOSIS — N184 Chronic kidney disease, stage 4 (severe): Secondary | ICD-10-CM | POA: Diagnosis not present

## 2019-09-27 DIAGNOSIS — Z794 Long term (current) use of insulin: Secondary | ICD-10-CM

## 2019-09-27 DIAGNOSIS — C50919 Malignant neoplasm of unspecified site of unspecified female breast: Secondary | ICD-10-CM

## 2019-09-27 DIAGNOSIS — I129 Hypertensive chronic kidney disease with stage 1 through stage 4 chronic kidney disease, or unspecified chronic kidney disease: Secondary | ICD-10-CM | POA: Diagnosis not present

## 2019-09-27 DIAGNOSIS — M79662 Pain in left lower leg: Secondary | ICD-10-CM | POA: Diagnosis not present

## 2019-09-27 DIAGNOSIS — E1169 Type 2 diabetes mellitus with other specified complication: Secondary | ICD-10-CM

## 2019-09-27 LAB — COMPREHENSIVE METABOLIC PANEL
ALT: 26 U/L (ref 0–44)
AST: 23 U/L (ref 15–41)
Albumin: 4.3 g/dL (ref 3.5–5.0)
Alkaline Phosphatase: 62 U/L (ref 38–126)
Anion gap: 14 (ref 5–15)
BUN: 99 mg/dL — ABNORMAL HIGH (ref 8–23)
CO2: 17 mmol/L — ABNORMAL LOW (ref 22–32)
Calcium: 7.9 mg/dL — ABNORMAL LOW (ref 8.9–10.3)
Chloride: 100 mmol/L (ref 98–111)
Creatinine, Ser: 7.3 mg/dL — ABNORMAL HIGH (ref 0.44–1.00)
GFR calc Af Amer: 6 mL/min — ABNORMAL LOW (ref >60)
GFR calc non Af Amer: 5 mL/min — ABNORMAL LOW (ref >60)
Glucose, Bld: 115 mg/dL — ABNORMAL HIGH (ref 70–99)
Potassium: 5.9 mmol/L — ABNORMAL HIGH (ref 3.5–5.1)
Sodium: 131 mmol/L — ABNORMAL LOW (ref 135–145)
Total Bilirubin: 0.4 mg/dL (ref 0.3–1.2)
Total Protein: 8 g/dL (ref 6.5–8.1)

## 2019-09-27 LAB — CBC WITH DIFFERENTIAL/PLATELET
Abs Immature Granulocytes: 0.12 10*3/uL — ABNORMAL HIGH (ref 0.00–0.07)
Basophils Absolute: 0 10*3/uL (ref 0.0–0.1)
Basophils Relative: 0 %
Eosinophils Absolute: 0 10*3/uL (ref 0.0–0.5)
Eosinophils Relative: 0 %
HCT: 29.3 % — ABNORMAL LOW (ref 36.0–46.0)
Hemoglobin: 9.8 g/dL — ABNORMAL LOW (ref 12.0–15.0)
Immature Granulocytes: 2 %
Lymphocytes Relative: 12 %
Lymphs Abs: 0.9 10*3/uL (ref 0.7–4.0)
MCH: 32.1 pg (ref 26.0–34.0)
MCHC: 33.4 g/dL (ref 30.0–36.0)
MCV: 96.1 fL (ref 80.0–100.0)
Monocytes Absolute: 1.2 10*3/uL — ABNORMAL HIGH (ref 0.1–1.0)
Monocytes Relative: 15 %
Neutro Abs: 5.6 10*3/uL (ref 1.7–7.7)
Neutrophils Relative %: 71 %
Platelets: 379 10*3/uL (ref 150–400)
RBC: 3.05 MIL/uL — ABNORMAL LOW (ref 3.87–5.11)
RDW: 14.5 % (ref 11.5–15.5)
WBC: 7.9 10*3/uL (ref 4.0–10.5)
nRBC: 0 % (ref 0.0–0.2)

## 2019-09-27 LAB — URINALYSIS, COMPLETE (UACMP) WITH MICROSCOPIC
Bacteria, UA: NONE SEEN
Bilirubin Urine: NEGATIVE
Glucose, UA: NEGATIVE mg/dL
Hgb urine dipstick: NEGATIVE
Ketones, ur: NEGATIVE mg/dL
Nitrite: NEGATIVE
Protein, ur: 30 mg/dL — AB
Specific Gravity, Urine: 1.012 (ref 1.005–1.030)
pH: 5 (ref 5.0–8.0)

## 2019-09-27 LAB — SODIUM, URINE, RANDOM: Sodium, Ur: 34 mmol/L

## 2019-09-27 LAB — RENAL FUNCTION PANEL
Albumin: 4 g/dL (ref 3.5–5.0)
Anion gap: 13 (ref 5–15)
BUN: 99 mg/dL — ABNORMAL HIGH (ref 8–23)
CO2: 16 mmol/L — ABNORMAL LOW (ref 22–32)
Calcium: 7.8 mg/dL — ABNORMAL LOW (ref 8.9–10.3)
Chloride: 105 mmol/L (ref 98–111)
Creatinine, Ser: 6.91 mg/dL — ABNORMAL HIGH (ref 0.44–1.00)
GFR calc Af Amer: 7 mL/min — ABNORMAL LOW (ref >60)
GFR calc non Af Amer: 6 mL/min — ABNORMAL LOW (ref >60)
Glucose, Bld: 95 mg/dL (ref 70–99)
Phosphorus: 8.5 mg/dL — ABNORMAL HIGH (ref 2.5–4.6)
Potassium: 5.4 mmol/L — ABNORMAL HIGH (ref 3.5–5.1)
Sodium: 134 mmol/L — ABNORMAL LOW (ref 135–145)

## 2019-09-27 LAB — BRAIN NATRIURETIC PEPTIDE: B Natriuretic Peptide: 60 pg/mL (ref 0.0–100.0)

## 2019-09-27 LAB — GLUCOSE, CAPILLARY: Glucose-Capillary: 90 mg/dL (ref 70–99)

## 2019-09-27 MED ORDER — ACETAMINOPHEN 650 MG RE SUPP: 650.0000 mg | Freq: Four times a day (QID) | RECTAL | Status: DC | PRN

## 2019-09-27 MED ORDER — FLUTICASONE PROPIONATE (INHAL) 100 MCG/BLIST IN AEPB: 2.0000 | INHALATION_SPRAY | Freq: Two times a day (BID) | RESPIRATORY_TRACT | Status: DC | PRN

## 2019-09-27 MED ORDER — ACETAMINOPHEN 325 MG PO TABS: 650.0000 mg | ORAL_TABLET | Freq: Four times a day (QID) | ORAL | Status: DC | PRN

## 2019-09-27 MED ORDER — FAMOTIDINE 20 MG PO TABS
10.0000 mg | ORAL_TABLET | Freq: Every day | ORAL | Status: DC
  Administered 2019-09-28 – 2019-10-01 (×4): 10 mg via ORAL
  Filled 2019-09-27 (×4): qty 1

## 2019-09-27 MED ORDER — CALCIUM GLUCONATE-NACL 1-0.675 GM/50ML-% IV SOLN
1.0000 g | Freq: Once | INTRAVENOUS | Status: AC
  Administered 2019-09-27: 22:00:00 1000 mg via INTRAVENOUS
  Filled 2019-09-27: qty 50

## 2019-09-27 MED ORDER — ALBUTEROL SULFATE (2.5 MG/3ML) 0.083% IN NEBU: 2.5000 mg | INHALATION_SOLUTION | Freq: Four times a day (QID) | RESPIRATORY_TRACT | Status: DC | PRN

## 2019-09-27 MED ORDER — ATENOLOL 25 MG PO TABS
100.0000 mg | ORAL_TABLET | Freq: Two times a day (BID) | ORAL | Status: DC
  Administered 2019-09-29 – 2019-10-01 (×5): 100 mg via ORAL
  Filled 2019-09-27 (×7): qty 4

## 2019-09-27 MED ORDER — CALCITRIOL 0.25 MCG PO CAPS
0.2500 ug | ORAL_CAPSULE | Freq: Every day | ORAL | Status: DC
  Administered 2019-09-28 – 2019-10-01 (×4): 0.25 ug via ORAL
  Filled 2019-09-27 (×4): qty 1

## 2019-09-27 MED ORDER — HEPARIN SODIUM (PORCINE) 5000 UNIT/ML IJ SOLN
5000.0000 [IU] | Freq: Three times a day (TID) | INTRAMUSCULAR | Status: DC
  Administered 2019-09-27 – 2019-10-01 (×12): 5000 [IU] via SUBCUTANEOUS
  Filled 2019-09-27 (×13): qty 1

## 2019-09-27 MED ORDER — AMLODIPINE BESYLATE 5 MG PO TABS
10.0000 mg | ORAL_TABLET | Freq: Every day | ORAL | Status: DC
  Administered 2019-09-29 – 2019-10-01 (×3): 10 mg via ORAL
  Filled 2019-09-27 (×3): qty 2

## 2019-09-27 MED ORDER — SENNOSIDES-DOCUSATE SODIUM 8.6-50 MG PO TABS
2.0000 | ORAL_TABLET | Freq: Two times a day (BID) | ORAL | Status: DC
  Administered 2019-09-27 – 2019-10-01 (×5): 2 via ORAL
  Filled 2019-09-27 (×7): qty 2

## 2019-09-27 MED ORDER — FLUOXETINE HCL 20 MG PO CAPS
20.0000 mg | ORAL_CAPSULE | Freq: Two times a day (BID) | ORAL | Status: DC
  Administered 2019-09-28 – 2019-10-01 (×8): 20 mg via ORAL
  Filled 2019-09-27 (×9): qty 1

## 2019-09-27 MED ORDER — ASPIRIN EC 81 MG PO TBEC
81.0000 mg | DELAYED_RELEASE_TABLET | Freq: Every day | ORAL | Status: DC
  Administered 2019-09-28 – 2019-10-01 (×4): 81 mg via ORAL
  Filled 2019-09-27 (×4): qty 1

## 2019-09-27 MED ORDER — NON FORMULARY: 3.0000 mg | Freq: Every evening | Status: DC | PRN

## 2019-09-27 MED ORDER — BUDESONIDE 0.25 MG/2ML IN SUSP
0.2500 mg | Freq: Two times a day (BID) | RESPIRATORY_TRACT | Status: DC
  Administered 2019-09-28 – 2019-10-01 (×7): 0.25 mg via RESPIRATORY_TRACT
  Filled 2019-09-27 (×7): qty 2

## 2019-09-27 MED ORDER — SIMVASTATIN 20 MG PO TABS
20.0000 mg | ORAL_TABLET | Freq: Every evening | ORAL | Status: DC
  Administered 2019-09-28 – 2019-09-30 (×3): 20 mg via ORAL
  Filled 2019-09-27 (×3): qty 1

## 2019-09-27 MED ORDER — LACTATED RINGERS IV SOLN: INTRAVENOUS | Status: DC

## 2019-09-27 MED ORDER — SODIUM CHLORIDE 0.9 % IV SOLN
1.0000 g | Freq: Once | INTRAVENOUS | Status: AC
  Administered 2019-09-27: 21:00:00 1 g via INTRAVENOUS
  Filled 2019-09-27: qty 10

## 2019-09-27 MED ORDER — POLYETHYLENE GLYCOL 3350 17 G PO PACK
17.0000 g | PACK | Freq: Two times a day (BID) | ORAL | Status: DC
  Administered 2019-09-27 – 2019-10-01 (×5): 17 g via ORAL
  Filled 2019-09-27 (×7): qty 1

## 2019-09-27 MED ORDER — INSULIN ASPART 100 UNIT/ML ~~LOC~~ SOLN
5.0000 [IU] | Freq: Two times a day (BID) | SUBCUTANEOUS | Status: DC
  Administered 2019-09-28 – 2019-10-01 (×7): 5 [IU] via SUBCUTANEOUS
  Filled 2019-09-27 (×7): qty 1

## 2019-09-27 MED ORDER — SODIUM CHLORIDE 0.9 % IV SOLN: 1.0000 g | Freq: Once | INTRAVENOUS | Status: DC

## 2019-09-27 MED ORDER — GABAPENTIN 100 MG PO CAPS
100.0000 mg | ORAL_CAPSULE | Freq: Every day | ORAL | Status: DC
  Administered 2019-09-27 – 2019-09-30 (×4): 100 mg via ORAL
  Filled 2019-09-27 (×4): qty 1

## 2019-09-27 MED ORDER — LACTATED RINGERS IV BOLUS
1000.0000 mL | Freq: Once | INTRAVENOUS | Status: AC
  Administered 2019-09-27: 19:00:00 1000 mL via INTRAVENOUS

## 2019-09-27 MED ORDER — AMLODIPINE BESYLATE 5 MG PO TABS: 10.0000 mg | ORAL_TABLET | Freq: Every day | ORAL | Status: DC

## 2019-09-27 MED ORDER — SODIUM ZIRCONIUM CYCLOSILICATE 10 G PO PACK
10.0000 g | PACK | Freq: Once | ORAL | Status: AC
  Administered 2019-09-28: 10 g via ORAL
  Filled 2019-09-27: qty 1

## 2019-09-27 MED ORDER — MELATONIN 5 MG PO TABS
5.0000 mg | ORAL_TABLET | Freq: Every evening | ORAL | Status: DC | PRN
  Administered 2019-09-27 – 2019-09-30 (×4): 5 mg via ORAL
  Filled 2019-09-27 (×4): qty 1

## 2019-09-27 MED ORDER — SODIUM CHLORIDE 0.9% FLUSH
3.0000 mL | Freq: Two times a day (BID) | INTRAVENOUS | Status: DC
  Administered 2019-09-27 – 2019-10-01 (×5): 3 mL via INTRAVENOUS

## 2019-09-27 MED ORDER — ALPRAZOLAM 0.5 MG PO TABS
0.5000 mg | ORAL_TABLET | Freq: Two times a day (BID) | ORAL | Status: DC | PRN
  Filled 2019-09-27: qty 1

## 2019-09-27 MED ORDER — INSULIN DETEMIR 100 UNIT/ML ~~LOC~~ SOLN
35.0000 [IU] | Freq: Every day | SUBCUTANEOUS | Status: DC
  Administered 2019-09-28 – 2019-10-01 (×4): 35 [IU] via SUBCUTANEOUS
  Filled 2019-09-27 (×5): qty 0.35

## 2019-09-27 MED ORDER — OXYCODONE-ACETAMINOPHEN 5-325 MG PO TABS: 1.0000 | ORAL_TABLET | Freq: Every day | ORAL | Status: DC | PRN

## 2019-09-27 MED ORDER — CLONIDINE HCL 0.1 MG PO TABS
0.2000 mg | ORAL_TABLET | Freq: Two times a day (BID) | ORAL | Status: DC
  Administered 2019-09-29 – 2019-10-01 (×5): 0.2 mg via ORAL
  Filled 2019-09-27 (×6): qty 2

## 2019-09-27 MED ORDER — SODIUM CHLORIDE 0.9 % IV SOLN: INTRAVENOUS | Status: DC

## 2019-09-27 MED ORDER — CLONIDINE HCL 0.1 MG PO TABS: 0.2000 mg | ORAL_TABLET | Freq: Two times a day (BID) | ORAL | Status: DC

## 2019-09-27 NOTE — ED Notes (Signed)
Pt given meal tray.

## 2019-09-27 NOTE — ED Triage Notes (Addendum)
Pt presents to ED via POV with c/o abnormal labs. Pt states was sent by her cancer doctor due to abnormal labs, states had labs, [place], and X-rays done PTA.   Abnormal Labs: BUN 99, Creatinine 7.3, K 5.9

## 2019-09-27 NOTE — ED Provider Notes (Signed)
[facility] Emergency Department Provider Note  ____________________________________________   First MD Initiated Contact with Patient 09/27/19 1831     (approximate)  I have reviewed the triage vital signs and the nursing notes.   HISTORY  Chief Complaint Abnormal Lab    HPI [name] is a 64 y.o. female with past medical history as below here with abnormal lab.  The patient currently has known  metastatic breast cancer, on chemotherapy, with CKD.  She reportedly has been generally weak for the last 1 to 2 weeks, with increasing swelling in her legs, weakness, and sensation of general malaise.  She has had somewhat decreased appetite.  Denies any vomiting or diarrhea.  She has fallen multiple times due to the weakness in her legs, which causes her legs to "give out." She's had slightly darker and decreased UOP. She had outpt labs with Oncology today which showed significant AKI. Sent here for admission.       Past Medical History:  Diagnosis Date  . Anemia   . Anxiety   . Asthma   . Cancer ([place]) 03/10/2018   Per NM PET order. Carcinoma of upper-inner quadrant of left breast in female, estrogen receptor positive .  Marland Kitchen Cancer (HCC)    LUNG  . CHF (congestive heart failure) ([place]) 1997  . CKD (chronic kidney disease)   . Depression   . Diabetes mellitus, type 2 ([place])   . Family history of breast cancer   . Family history of colon cancer   . Family history of ovarian cancer   . Family history of pancreatic cancer   . Family history of prostate cancer   . Family history of stomach cancer   . GERD (gastroesophageal reflux disease)    history of an ulcer  . Hair loss   . History of left breast cancer 05/29/14  . History of partial hysterectomy 12/31/2016   Per patient.  Has not had a period in years.  Had a partial hysterectomy years ago.  Marland Kitchen Hypertension   . Mitral valve regurgitation   . Neuromuscular disorder (HCC)    neuropathies in hand   . Obesity   . Pancreatitis 1997  . Stroke [facility]) 2010   with mild left arm weakness    Patient Active Problem List   Diagnosis Date Noted  . Taking a statin medication 05/18/2019  . Anemia in chronic kidney disease 04/27/2019  . Benign hypertensive kidney disease with chronic kidney disease 04/27/2019  . Hyposmolality and/or hyponatremia 04/27/2019  . Proteinuria 04/27/2019  . Secondary hyperparathyroidism of renal origin ([place]) 04/27/2019  . Goals of care, counseling/discussion 10/01/2018  . Major depressive disorder, recurrent, in remission ([place]) 09/13/2018  . Metastasis from malignant tumor of breast ([place]) 09/13/2018  . Genetic testing 07/02/2018  . Family history of breast cancer   . Family history of ovarian cancer   . Family history of pancreatic cancer   . Family history of colon cancer   . Family history of stomach cancer   . Family history of prostate cancer   . Carcinoma of upper-inner quadrant of left breast in female, estrogen receptor positive ([place]) 05/23/2016  . Type 2 diabetes mellitus with diabetic chronic kidney disease ([place]) 08/27/2015  . Cerebrovascular accident (CVA) ([place]) 08/27/2015  . Benign essential HTN 12/15/2014  . Chronic systolic heart failure ([place]) 06/21/2014  . Combined fat and carbohydrate induced hyperlipemia 06/21/2014  . MI (mitral incompetence) 06/21/2014  . Airway hyperreactivity 06/09/2014  . Chronic kidney disease (  CKD), stage V ([place]) 06/09/2014  . Clinical depression 06/09/2014    Past Surgical History:  Procedure Laterality Date  . CATARACT EXTRACTION W/PHACO Right 02/24/2019   Procedure: CATARACT EXTRACTION PHACO AND INTRAOCULAR LENS PLACEMENT ([place]) RIGHT DIABETES;  Surgeon: Marchia Meiers, MD;  Location: [facility] ORS;  Service: Ophthalmology;  Laterality: Right;  [place] 01:13.0 CDE 7.96 Fluid Pack Lot # [id number]  . CATARACT EXTRACTION W/PHACO Left 03/24/2019   Procedure: CATARACT EXTRACTION PHACO AND INTRAOCULAR LENS PLACEMENT (IOC) - left  diabetic;  Surgeon: Marchia Meiers, MD;  Location: [facility] ORS;  Service: Ophthalmology;  Laterality: Left;  [place]  01:36 CDE 13.93 Fluid pack lot # [id number]  . CESAREAN SECTION    . CHOLECYSTECTOMY    . EXCISION OF TONGUE LESION N/A 08/17/2018   Procedure: EXCISION OF TONGUE LESION WITH FROZEN SECTIONS;  Surgeon: Beverly Gust, MD;  Location: [facility] ORS;  Service: ENT;  Laterality: N/A;  . EYE SURGERY Right    cataract extraction  . PARTIAL HYSTERECTOMY  12/31/2016   Per patient, she has not had a period in years since she had a partial hysterectomy.  Marland Kitchen PORTA CATH INSERTION    . TUBAL LIGATION      Prior to Admission medications   Medication Sig Start Date End Date Taking? Authorizing Provider  acetaminophen-codeine (TYLENOL #4) 300-60 MG tablet Take 1-2 tablets by mouth every 4 (four) hours as needed for moderate pain.  04/10/15   [provider]  albuterol (PROAIR HFA) 108 (90 BASE) MCG/ACT inhaler Inhale 2 puffs into the lungs every 6 (six) hours as needed for wheezing or shortness of breath.  06/20/14   [provider]  albuterol (PROVENTIL) (2.5 MG/3ML) 0.083% nebulizer solution Inhale 2.5 mg into the lungs every 6 (six) hours as needed for shortness of breath.  07/17/14   [provider]  ALPRAZolam Duanne Moron) 0.5 MG tablet Take 0.5 mg by mouth at bedtime as needed for anxiety or sleep.  06/20/14   [provider]  amLODipine (NORVASC) 10 MG tablet Take 10 mg by mouth daily.  06/21/14   [provider]  aspirin EC 81 MG tablet Take 81 mg by mouth daily.  06/21/14   [provider]  atenolol (TENORMIN) 100 MG tablet Take 100 mg by mouth 2 (two) times daily.  04/07/14   [provider]  B-D ULTRA-FINE 33 LANCETS MISC Use 1 each 2 (two) times daily. 06/20/14   [provider]  B-D ULTRAFINE III SHORT PEN 31G X 8 MM MISC  04/05/15   [provider]  bumetanide (BUMEX) 0.5 MG tablet Take 0.5 mg by mouth 2 (two) times daily.   08/22/14   [provider]  calcitRIOL (ROCALTROL) 0.25 MCG capsule Take 0.25 mcg by mouth daily.  09/21/16   [provider]  cephALEXin (KEFLEX) 500 MG capsule Take 1 capsule by mouth every 6 (six) hours. 09/19/19   [provider]  Cinnamon 500 MG capsule Take 1,000 mg by mouth daily.     [provider]  cloNIDine (CATAPRES) 0.2 MG tablet Take 0.2 mg by mouth 2 (two) times daily.  06/21/14   [provider]  diflorasone (PSORCON) 0.05 % ointment Apply 1 application topically daily.  06/21/19   [provider]  ELDERBERRY PO Take 1 tablet by mouth daily.    [provider]  enalapril (VASOTEC) 10 MG tablet Take 20 mg by mouth 2 (two) times a day.  01/12/19   [provider]  famotidine (PEPCID)  20 MG tablet Take 1 tablet by mouth daily. 09/14/19   [provider]  ferrous sulfate 325 (65 FE) MG tablet Take 325 mg by mouth 2 (two) times daily with a meal.    [provider]  FLUoxetine (PROZAC) 20 MG capsule Take 20 mg by mouth 2 (two) times daily.  06/21/14   [provider]  Fluticasone Propionate, Inhal, (FLOVENT DISKUS) 100 MCG/BLIST AEPB Inhale 2 puffs into the lungs 2 (two) times a day. Inhale 2 inhalations into the lungs 2 times daily PRN  09/13/18   [provider]  gabapentin (NEURONTIN) 100 MG capsule TAKE 1 CAPSULE(100 MG) BY MOUTH AT BEDTIME Patient taking differently: Take 100 mg by mouth at bedtime.  12/22/18   Cammie Sickle, MD  glucose blood (ONE TOUCH ULTRA TEST) test strip Use 1 each 2 (two) times daily. Use as instructed. 06/20/14   [provider]  glyBURIDE (DIABETA) 5 MG tablet Take 10 mg by mouth 2 (two) times daily with a meal.  06/20/14   [provider]  LEVEMIR FLEXTOUCH 100 UNIT/ML Pen Inject 55 Units into the skin daily.  05/09/15   [provider]  loperamide (IMODIUM A-D) 2 MG capsule Take 2-4 mg by mouth 4 (four) times daily as  needed for diarrhea or loose stools.     [provider]  mometasone (NASONEX) 50 MCG/ACT nasal spray Place 2 sprays into the nose daily as needed (Allergies).  12/19/15   [provider]  NOVOLOG FLEXPEN 100 UNIT/ML FlexPen Inject 7 Units into the skin 2 (two) times daily.  11/09/15   [provider]  ondansetron (ZOFRAN) 8 MG tablet One pill every 8 hours as needed for nausea/vomitting. Patient not taking: Reported on 09/27/2019 11/08/17   Cammie Sickle, MD  prochlorperazine (COMPAZINE) 10 MG tablet Take 1 tablet (10 mg total) by mouth every 6 (six) hours as needed for nausea or vomiting. Please note change in strength Patient not taking: Reported on 09/27/2019 01/05/18   Cammie Sickle, MD  salmeterol (SEREVENT) 50 MCG/DOSE diskus inhaler Inhale 1 puff into the lungs daily as needed (shortness of breath).     [provider]  simvastatin (ZOCOR) 20 MG tablet Take 20 mg by mouth daily at 6 PM.  06/21/14   [provider]  sulfamethoxazole-trimethoprim (BACTRIM DS) 800-160 MG tablet Take 1 tablet by mouth in the morning and at bedtime. 09/19/19   [provider]  vitamin B-12 (CYANOCOBALAMIN) 1000 MCG tablet Take 1,000 mcg by mouth daily.    [provider]    Allergies Patient has no known allergies.  Family History  Problem Relation Age of Onset  . Ovarian cancer Mother 75  . Diabetes Mother   . Hypertension Mother   . COPD Father   . Hypertension Father   . Colon cancer Father 5  . Diabetes Sister   . Breast cancer Sister 11       bilateral  . Diabetes Brother   . Leukemia Maternal Aunt   . Pancreatic cancer Paternal Aunt 18  . Pancreatic cancer Paternal Uncle   . Colon cancer Paternal Uncle   . Stomach cancer Maternal Grandfather 71  . Throat cancer Paternal Grandmother   . Breast cancer Maternal Aunt 80  . Colon cancer Maternal Aunt   . Bone cancer Maternal Aunt   . Breast cancer Paternal Aunt        dx  >50  . Prostate cancer Paternal Uncle   .  Pancreatic cancer Paternal Uncle   . Throat cancer Paternal Uncle   . Lung cancer Paternal Uncle   . Stomach cancer Paternal Uncle   . Brain cancer Paternal Aunt   . Cancer Cousin        liver, kidney  . Prostate cancer Cousin        meastatic  . Lung cancer Other     Social History Social History   Tobacco Use  . Smoking status: Former Smoker    Packs/day: 0.50    Years: 1.00    Pack years: 0.50    Types: Cigarettes  . Smokeless tobacco: Never Used  Substance Use Topics  . Alcohol use: No    Alcohol/week: 0.0 standard drinks  . Drug use: No    Review of Systems  Review of Systems  Constitutional: Positive for fatigue. Negative for fever.  HENT: Negative for congestion and sore throat.   Eyes: Negative for visual disturbance.  Respiratory: Negative for cough and shortness of breath.   Cardiovascular: Negative for chest pain.  Gastrointestinal: Negative for abdominal pain, diarrhea, nausea and vomiting.  Genitourinary: Negative for flank pain.  Musculoskeletal: Positive for gait problem. Negative for back pain and neck pain.  Skin: Negative for rash and wound.  Neurological: Positive for weakness.  All other systems reviewed and are negative.    ____________________________________________  PHYSICAL EXAM:      VITAL SIGNS: ED Triage Vitals  Enc Vitals Group     BP 09/27/19 1709 118/68     Pulse Rate 09/27/19 1709 68     Resp 09/27/19 1709 20     Temp 09/27/19 1709 97.6 F (36.4 C)     Temp Source 09/27/19 1709 Oral     SpO2 09/27/19 1709 98 %     Weight 09/27/19 1708 198 lb (89.8 kg)     Height 09/27/19 1708 5\' 3"  (1.6 m)     Head Circumference --      Peak Flow --      Pain Score 09/27/19 1716 0     Pain Loc --      Pain Edu? --      Excl. in [place]? --      Physical Exam Vitals and nursing note reviewed.  Constitutional:      General: She is not in acute distress.    Appearance: She is well-developed.   HENT:     Head: Normocephalic and atraumatic.     Mouth/Throat:     Mouth: Mucous membranes are dry.  Eyes:     Conjunctiva/sclera: Conjunctivae normal.  Cardiovascular:     Rate and Rhythm: Normal rate and regular rhythm.     Heart sounds: Normal heart sounds. No murmur. No friction rub.  Pulmonary:     Effort: Pulmonary effort is normal. No respiratory distress.     Breath sounds: Normal breath sounds. No wheezing or rales.  Abdominal:     General: There is no distension.     Palpations: Abdomen is soft.     Tenderness: There is no abdominal tenderness.  Musculoskeletal:     Cervical back: Neck supple.     Right lower leg: Edema present.     Left lower leg: Edema present.  Skin:    General: Skin is warm.     Capillary Refill: Capillary refill takes less than 2 seconds.     Findings: No rash.  Neurological:     Mental Status: She is alert and oriented to person, place, and time.  Motor: No abnormal muscle tone.       ____________________________________________   LABS (all labs ordered are listed, but only abnormal results are displayed)  Labs Reviewed  URINALYSIS, COMPLETE (UACMP) WITH MICROSCOPIC - Abnormal; Notable for the following components:      Result Value   Color, Urine YELLOW (*)    APPearance CLOUDY (*)    Protein, ur 30 (*)    Leukocytes,Ua LARGE (*)    All other components within normal limits    ____________________________________________  EKG: Normal sinus rhythm, ventricular rate 68.  QRS 99, QTc 47.  Slight peaking of T waves. No ischemia. ________________________________________  RADIOLOGY All imaging, including plain films, CT scans, and ultrasounds, independently reviewed by me, and interpretations confirmed via formal radiology reads.  ED MD interpretation:   CXR from clinic today: Clear AXR: neg DVT STudy b/l LE: No DVT  Official radiology report(s): DG Chest 2 View  Result Date: 09/27/2019 CLINICAL DATA:  64 year old female  with constipation. EXAM: CHEST - 2 VIEW COMPARISON:  Chest CT dated 02/16/2018. FINDINGS: Right-sided Port-A-Cath with tip over central SVC. Minimal left lung base subsegmental atelectasis. No focal consolidation, pleural effusion, or pneumothorax. The cardiac silhouette is within normal limits. No acute osseous pathology. IMPRESSION: No active cardiopulmonary disease. Electronically Signed   By: Anner Crete M.D.   On: 09/27/2019 17:23   US Venous Img Lower Bilateral  Result Date: 09/27/2019 CLINICAL DATA:  Bilateral lower extremity edema and pain EXAM: BILATERAL LOWER EXTREMITY VENOUS DOPPLER ULTRASOUND TECHNIQUE: Gray-scale sonography with graded compression, as well as color Doppler and duplex ultrasound were performed to evaluate the lower extremity deep venous systems from the level of the common femoral vein and including the common femoral, femoral, profunda femoral, popliteal and calf veins including the posterior tibial, peroneal and gastrocnemius veins when visible. The superficial great saphenous vein was also interrogated. Spectral Doppler was utilized to evaluate flow at rest and with distal augmentation maneuvers in the common femoral, femoral and popliteal veins. COMPARISON:  None. FINDINGS: RIGHT LOWER EXTREMITY Common Femoral Vein: No evidence of thrombus. Normal compressibility, respiratory phasicity and response to augmentation. Saphenofemoral Junction: No evidence of thrombus. Normal compressibility and flow on color Doppler imaging. Profunda Femoral Vein: No evidence of thrombus. Normal compressibility and flow on color Doppler imaging. Femoral Vein: No evidence of thrombus. Normal compressibility, respiratory phasicity and response to augmentation. Popliteal Vein: No evidence of thrombus. Normal compressibility, respiratory phasicity and response to augmentation. Calf Veins: Limited assessment of the calf veins secondary to edema. No significant occlusive thrombus visualized. LEFT  LOWER EXTREMITY Common Femoral Vein: No evidence of thrombus. Normal compressibility, respiratory phasicity and response to augmentation. Saphenofemoral Junction: No evidence of thrombus. Normal compressibility and flow on color Doppler imaging. Profunda Femoral Vein: No evidence of thrombus. Normal compressibility and flow on color Doppler imaging. Femoral Vein: No evidence of thrombus. Normal compressibility, respiratory phasicity and response to augmentation. Popliteal Vein: No evidence of thrombus. Normal compressibility, respiratory phasicity and response to augmentation. Calf Veins: Limited assessment of the calf veins because of edema. No significant occlusive thrombus. Other Findings: Popliteal fossa Baker's cyst measures 3.4 x 1.9 x 2.9 cm. IMPRESSION: No evidence of significant DVT in either extremity. Limited assessment of the calf veins. 3.4 cm left popliteal fossa Baker's cyst. Electronically Signed   By: Jerilynn Mages.  Shick M.D.   On: 09/27/2019 16:32   DG Abd 2 Views  Result Date: 09/27/2019 CLINICAL DATA:  64 year old female with constipation. EXAM: ABDOMEN - 2 VIEW  COMPARISON:  CT of the abdomen pelvis dated 02/16/2018. FINDINGS: Large amount of dense stool noted throughout the colon. There is no bowel dilatation or evidence of obstruction. No free air or radiopaque calculi. Right upper quadrant cholecystectomy clips. A single tubal ligation clip noted in the right hemipelvis. The osseous structures and soft tissues are grossly unremarkable. IMPRESSION: Constipation.  No bowel obstruction. Electronically Signed   By: Anner Crete M.D.   On: 09/27/2019 17:24    ____________________________________________  PROCEDURES   Procedure(s) performed (including Critical Care):  Procedures  ____________________________________________  INITIAL IMPRESSION / MDM / [place] / ED COURSE  As part of my medical decision making, I reviewed the following data within the electronic medical  record:  Nursing notes reviewed and incorporated, Old chart reviewed, Notes from prior ED visits, and [place] was evaluated in Emergency Department on 09/27/2019 for the symptoms described in the history of present illness. She was evaluated in the context of the global COVID-19 pandemic, which necessitated consideration that the patient might be at risk for infection with the SARS-CoV-2 virus that causes COVID-19. Institutional protocols and algorithms that pertain to the evaluation of patients at risk for COVID-19 are in a state of rapid change based on information released by regulatory bodies including the CDC and federal and state organizations. These policies and algorithms were followed during the patient's care in the ED.  Some ED evaluations and interventions may be delayed as a result of limited staffing during the pandemic.*     Medical Decision Making:  64 yo F here with acute on chronic kidney failure in setting of possible UTI, active chemotherapy. Will start IVF, admission. UA with pyuria, hematuria - will start Rocephin.  ____________________________________________  FINAL CLINICAL IMPRESSION(S) / ED DIAGNOSES  Final diagnoses:  Acute renal failure superimposed on stage 4 chronic kidney disease, unspecified acute renal failure type ([place])     MEDICATIONS GIVEN DURING THIS VISIT:  Medications  cefTRIAXone (ROCEPHIN) 1 g in sodium chloride 0.9 % 100 mL IVPB (has no administration in time range)  lactated ringers bolus 1,000 mL (1,000 mLs Intravenous New Bag/Given 09/27/19 1906)     ED Discharge Orders    None       Note:  This document was prepared using Dragon voice recognition software and may include unintentional dictation errors.   Duffy Bruce, MD 09/27/19 [phone]

## 2019-09-27 NOTE — ED Notes (Signed)
Pt requesting "something to help me sleep." Message sent to MD via secure chat.

## 2019-09-27 NOTE — Telephone Encounter (Signed)
Spoke with patient per Satira Anis, NP. Patient being sent to the emergency room for acute renal failure. Potassium at 5.9. creatinine 7.30. Per Sonia Baller, NP. Pt's nephrologist - Dr. Juleen China is aware that patient is going to the ER.

## 2019-09-27 NOTE — H&P (Signed)
History and Physical    [name] KNL:[id number] DOB: 05-Aug-1955 DOA: 09/27/2019  PCP: [name], MD  Patient coming from: Oncology office  I have personally briefly reviewed patient's old medical records in [place]  Chief Complaint: Abnormal lab  HPI: [name] is a 64 y.o. 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  Patient reports about 2 weeks of slight swelling in both of her legs associated with cramping sensation.  She has some generalized weakness and reports having a couple minor falls at times when she felt as if her knees were giving out.  She denies any significant injury.  She reports constipation with last bowel movement occurring 7 days ago.  She reports good urine output without dysuria.  She denies any subjective fevers, chills, diaphoresis, chest pain, palpitations, dyspnea, abdominal pain.  She was seen at her oncology office today.  She underwent bilateral lower extremity ultrasound which were negative for DVT.  A 3.4 cm left popliteal fossa Baker's cyst was noted.  2 view chest x-ray showed right-sided Port-A-Cath in place without active cardiopulmonary disease.  Abdominal x-ray showed large amount of dense stool throughout the colon without evidence of obstruction.  Labs were obtained and notable for BUN 99, creatinine 7.3, GFR 6 (creatinine 3.1, GFR 18 on 09/20/2019), sodium 131, potassium 5.9, chloride 100, bicarb 17, WBC 7.9, hemoglobin 9.8, platelets 379,000, BNP 60.  Due to her worsening renal function she was advised to present to the ED for further evaluation and management.  Of note, patient was recently seen by her orthopedic physician for evaluation of  small abscess at the distal tip of the right thumb on 09/19/2019.  Per documentation there was no surrounding cellulitis.  Abscess was punctured and cultures were collected and sent.  She was started on prophylactic Keflex and Bactrim.  ED Course:  Initial vitals show BP 118/68, pulse 68, RR 20, temp 97.6 Fahrenheit, SPO2 98% on room air.  Urinalysis showed negative nitrites, large leukocytes, 21-50 RBC/hpf, 21-WBC/hpf, no bacteria on microscopy.  Patient was given 1 L LR, IV calcium gluconate 1 g, and IV ceftriaxone.  The hospitalist service was consulted admit for further evaluation management.  Review of Systems: All systems reviewed and are negative except as documented in history of present illness above.   Past Medical History:  Diagnosis Date  . Anemia   . Anxiety   . Asthma   . Cancer ([place]) 03/10/2018   Per NM PET order. Carcinoma of upper-inner quadrant of left breast in female, estrogen receptor positive .  Marland Kitchen Cancer (HCC)    LUNG  . CHF (congestive heart failure) ([place]) 1997  . CKD (chronic kidney disease)   . Depression   . Diabetes mellitus, type 2 ([place])   . Family history of breast cancer   . Family history of colon cancer   . Family history of ovarian cancer   . Family history of pancreatic cancer   . Family history of prostate cancer   . Family history of stomach cancer   . GERD (gastroesophageal reflux disease)    history of an ulcer  . Hair loss   . History of left breast cancer 05/29/14  . History of partial hysterectomy 12/31/2016   Per patient.  Has not  had a period in years.  Had a partial hysterectomy years ago.  Marland Kitchen Hypertension   . Mitral valve regurgitation   . Neuromuscular disorder (HCC)    neuropathies in hand  . Obesity   . Pancreatitis 1997  . Stroke [facility]) 2010   with mild left arm weakness    Past Surgical History:  Procedure Laterality Date  . CATARACT EXTRACTION W/PHACO Right 02/24/2019   Procedure: CATARACT EXTRACTION PHACO AND  INTRAOCULAR LENS PLACEMENT ([place]) RIGHT DIABETES;  Surgeon: Marchia Meiers, MD;  Location: [facility] ORS;  Service: Ophthalmology;  Laterality: Right;  [place] 01:13.0 CDE 7.96 Fluid Pack Lot # [id number]  . CATARACT EXTRACTION W/PHACO Left 03/24/2019   Procedure: CATARACT EXTRACTION PHACO AND INTRAOCULAR LENS PLACEMENT ([facility]) - left diabetic;  Surgeon: Marchia Meiers, MD;  Location: [facility] ORS;  Service: Ophthalmology;  Laterality: Left;  [place]  01:36 CDE 13.93 Fluid pack lot # [id number]  . CESAREAN SECTION    . CHOLECYSTECTOMY    . EXCISION OF TONGUE LESION N/A 08/17/2018   Procedure: EXCISION OF TONGUE LESION WITH FROZEN SECTIONS;  Surgeon: Beverly Gust, MD;  Location: [facility] ORS;  Service: ENT;  Laterality: N/A;  . EYE SURGERY Right    cataract extraction  . PARTIAL HYSTERECTOMY  12/31/2016   Per patient, she has not had a period in years since she had a partial hysterectomy.  Marland Kitchen PORTA CATH INSERTION    . TUBAL LIGATION      Social History:  reports that she has quit smoking. Her smoking use included cigarettes. She has a 0.50 pack-year smoking history. She has never used smokeless tobacco. She reports that she does not drink alcohol or use drugs.  Allergies  Allergen Reactions  . Fish-Derived Products     Family History  Problem Relation Age of Onset  . Ovarian cancer Mother 41  . Diabetes Mother   . Hypertension Mother   . COPD Father   . Hypertension Father   . Colon cancer Father 58  . Diabetes Sister   . Breast cancer Sister 83       bilateral  . Diabetes Brother   . Leukemia Maternal Aunt   . Pancreatic cancer Paternal Aunt 87  . Pancreatic cancer Paternal Uncle   . Colon cancer Paternal Uncle   . Stomach cancer Maternal Grandfather 8  . Throat cancer Paternal Grandmother   . Breast cancer Maternal Aunt 80  . Colon cancer Maternal Aunt   . Bone cancer Maternal Aunt   . Breast cancer Paternal Aunt        dx >50  . Prostate cancer Paternal Uncle   . Pancreatic cancer Paternal  Uncle   . Throat cancer Paternal Uncle   . Lung cancer Paternal Uncle   . Stomach cancer Paternal Uncle   . Brain cancer Paternal Aunt   . Cancer Cousin        liver, kidney  . Prostate cancer Cousin        meastatic  . Lung cancer Other      Prior to Admission medications   Medication Sig Start Date End Date Taking? Authorizing Provider  acetaminophen-codeine (TYLENOL #4) 300-60 MG tablet Take 1 tablet by mouth 2 (two) times daily as needed for moderate pain.    Yes [provider]  ALPRAZolam Duanne Moron) 0.5 MG tablet Take 0.5 mg by mouth 2 (two) times daily as needed for anxiety or sleep.    Yes [provider]  atenolol (TENORMIN) 100 MG tablet Take 100 mg  by mouth 2 (two) times daily.  04/07/14  Yes [provider]  cephALEXin (KEFLEX) 500 MG capsule Take 1 capsule by mouth 4 (four) times daily.  09/19/19 09/29/19 Yes [provider]  cloNIDine (CATAPRES) 0.2 MG tablet Take 0.2 mg by mouth 2 (two) times daily.  06/21/14  Yes [provider]  enalapril (VASOTEC) 10 MG tablet Take 20 mg by mouth 2 (two) times a day.  01/12/19  Yes [provider]  famotidine (PEPCID) 20 MG tablet Take 20 mg by mouth 2 (two) times daily.  09/14/19  Yes [provider]  ferrous sulfate 325 (65 FE) MG tablet Take 325 mg by mouth 2 (two) times daily with a meal.   Yes [provider]  FLUoxetine (PROZAC) 20 MG capsule Take 20 mg by mouth 2 (two) times daily.  06/21/14  Yes [provider]  gabapentin (NEURONTIN) 100 MG capsule TAKE 1 CAPSULE(100 MG) BY MOUTH AT BEDTIME Patient taking differently: Take 100 mg by mouth at bedtime.  12/22/18  Yes Cammie Sickle, MD  glyBURIDE (DIABETA) 5 MG tablet Take 10 mg by mouth 2 (two) times daily with a meal.  06/20/14  Yes [provider]  LEVEMIR FLEXTOUCH 100 UNIT/ML Pen Inject 55 Units into the skin daily.  05/09/15  Yes [provider]  NOVOLOG FLEXPEN 100 UNIT/ML FlexPen  Inject 7 Units into the skin 2 (two) times daily.  11/09/15  Yes [provider]  simvastatin (ZOCOR) 20 MG tablet Take 20 mg by mouth daily at 6 PM.  06/21/14  Yes [provider]  sulfamethoxazole-trimethoprim (BACTRIM DS) 800-160 MG tablet Take 1 tablet by mouth 2 (two) times daily.  09/19/19 09/29/19 Yes [provider]  albuterol (PROAIR HFA) 108 (90 BASE) MCG/ACT inhaler Inhale 2 puffs into the lungs every 6 (six) hours as needed for wheezing or shortness of breath.  06/20/14   [provider]  albuterol (PROVENTIL) (2.5 MG/3ML) 0.083% nebulizer solution Inhale 2.5 mg into the lungs every 6 (six) hours as needed for shortness of breath.  07/17/14   [provider]  amLODipine (NORVASC) 10 MG tablet Take 10 mg by mouth daily.  06/21/14   [provider]  aspirin EC 81 MG tablet Take 81 mg by mouth daily.  06/21/14   [provider]  bumetanide (BUMEX) 0.5 MG tablet Take 0.5 mg by mouth 2 (two) times daily.  08/22/14   [provider]  calcitRIOL (ROCALTROL) 0.25 MCG capsule Take 0.25 mcg by mouth daily.  09/21/16   [provider]  Cinnamon 500 MG capsule Take 1,000 mg by mouth daily.     [provider]  diflorasone (PSORCON) 0.05 % ointment Apply 1 application topically daily.  06/21/19   [provider]  ELDERBERRY PO Take 1 tablet by mouth daily.    [provider]  Fluticasone Propionate, Inhal, (FLOVENT DISKUS) 100 MCG/BLIST AEPB Inhale 2 puffs into the lungs 2 (two) times a day. Inhale 2 inhalations into the lungs 2 times daily PRN  09/13/18   [provider]  loperamide (IMODIUM A-D) 2 MG capsule Take 2-4 mg by mouth 4 (four) times daily as needed for diarrhea or loose stools.     [provider]  mometasone (NASONEX) 50 MCG/ACT nasal spray Place 2 sprays into the nose daily as needed (Allergies).  12/19/15   [provider]  salmeterol (SEREVENT) 50 MCG/DOSE  diskus inhaler Inhale 1 puff into the lungs daily as needed (shortness  of breath).     [provider]  vitamin B-12 (CYANOCOBALAMIN) 1000 MCG tablet Take 1,000 mcg by mouth daily.    [provider]    Physical Exam: Vitals:   09/27/19 1708 09/27/19 1709  BP:  118/68  Pulse:  68  Resp:  20  Temp:  97.6 F (36.4 C)  TempSrc:  Oral  SpO2:  98%  Weight: 89.8 kg   Height: 5\' 3"  (1.6 m)    Constitutional: Resting in bed with head elevated, NAD, calm, comfortable Eyes: PERRL, lids and conjunctivae normal ENMT: Mucous membranes are moist. Posterior pharynx clear of any exudate or lesions.Normal dentition.  Neck: normal, supple, no masses. Respiratory: Faint end expiratory wheezing bilaterally.  Normal respiratory effort. No accessory muscle use.  Cardiovascular: Regular rate and rhythm, no murmurs / rubs / gallops.  Trace lower extremity edema just above the ankles. 2+ pedal pulses. Abdomen: no tenderness, no masses palpated. No hepatosplenomegaly. Bowel sounds positive.  Musculoskeletal: no clubbing / cyanosis.  S/p distal right thumb amputation. Good ROM, no contractures. Normal muscle tone.  Skin: S/p distal right thumb amputation without open wound, tenderness, or cellulitis.  No rashes, lesions, ulcers. No induration Neurologic: CN 2-12 grossly intact. Sensation intact, Strength 5/5 in all 4.  Psychiatric: Normal judgment and insight. Alert and oriented x 3. Normal mood.   Labs on Admission: I have personally reviewed following labs and imaging studies  CBC: Recent Labs  Lab 09/27/19 1406  WBC 7.9  NEUTROABS 5.6  HGB 9.8*  HCT 29.3*  MCV 96.1  PLT 834   Basic Metabolic Panel: Recent Labs  Lab 09/27/19 1406  NA 131*  K 5.9*  CL 100  CO2 17*  GLUCOSE 115*  BUN 99*  CREATININE 7.30*  CALCIUM 7.9*   GFR: Estimated Creatinine Clearance: 8.5 mL/min (A) (by C-G formula based on SCr of 7.3 mg/dL (H)). Liver Function Tests: Recent Labs  Lab  09/27/19 1406  AST 23  ALT 26  ALKPHOS 62  BILITOT 0.4  PROT 8.0  ALBUMIN 4.3   No results for input(s): LIPASE, AMYLASE in the last 168 hours. No results for input(s): AMMONIA in the last 168 hours. Coagulation Profile: No results for input(s): INR, PROTIME in the last 168 hours. Cardiac Enzymes: No results for input(s): CKTOTAL, CKMB, CKMBINDEX, TROPONINI in the last 168 hours. BNP (last 3 results) No results for input(s): PROBNP in the last 8760 hours. HbA1C: No results for input(s): HGBA1C in the last 72 hours. CBG: No results for input(s): GLUCAP in the last 168 hours. Lipid Profile: No results for input(s): CHOL, HDL, LDLCALC, TRIG, CHOLHDL, LDLDIRECT in the last 72 hours. Thyroid Function Tests: No results for input(s): TSH, T4TOTAL, FREET4, T3FREE, THYROIDAB in the last 72 hours. Anemia Panel: No results for input(s): VITAMINB12, FOLATE, FERRITIN, TIBC, IRON, RETICCTPCT in the last 72 hours. Urine analysis:    Component Value Date/Time   COLORURINE YELLOW (A) 09/27/2019 1900   APPEARANCEUR CLOUDY (A) 09/27/2019 1900   LABSPEC 1.012 09/27/2019 1900   PHURINE 5.0 09/27/2019 1900   GLUCOSEU NEGATIVE 09/27/2019 1900   HGBUR NEGATIVE 09/27/2019 1900   BILIRUBINUR NEGATIVE 09/27/2019 1900   KETONESUR NEGATIVE 09/27/2019 1900   PROTEINUR 30 (A) 09/27/2019 1900   NITRITE NEGATIVE 09/27/2019 1900   LEUKOCYTESUR LARGE (A) 09/27/2019 1900    Radiological Exams on Admission: DG Chest 2 View  Result Date: 09/27/2019 CLINICAL DATA:  64 year old female with constipation. EXAM: CHEST - 2 VIEW COMPARISON:  Chest CT dated  02/16/2018. FINDINGS: Right-sided Port-A-Cath with tip over central SVC. Minimal left lung base subsegmental atelectasis. No focal consolidation, pleural effusion, or pneumothorax. The cardiac silhouette is within normal limits. No acute osseous pathology. IMPRESSION: No active cardiopulmonary disease. Electronically Signed   By: Anner Crete M.D.   On:  09/27/2019 17:23   US Venous Img Lower Bilateral  Result Date: 09/27/2019 CLINICAL DATA:  Bilateral lower extremity edema and pain EXAM: BILATERAL LOWER EXTREMITY VENOUS DOPPLER ULTRASOUND TECHNIQUE: Gray-scale sonography with graded compression, as well as color Doppler and duplex ultrasound were performed to evaluate the lower extremity deep venous systems from the level of the common femoral vein and including the common femoral, femoral, profunda femoral, popliteal and calf veins including the posterior tibial, peroneal and gastrocnemius veins when visible. The superficial great saphenous vein was also interrogated. Spectral Doppler was utilized to evaluate flow at rest and with distal augmentation maneuvers in the common femoral, femoral and popliteal veins. COMPARISON:  None. FINDINGS: RIGHT LOWER EXTREMITY Common Femoral Vein: No evidence of thrombus. Normal compressibility, respiratory phasicity and response to augmentation. Saphenofemoral Junction: No evidence of thrombus. Normal compressibility and flow on color Doppler imaging. Profunda Femoral Vein: No evidence of thrombus. Normal compressibility and flow on color Doppler imaging. Femoral Vein: No evidence of thrombus. Normal compressibility, respiratory phasicity and response to augmentation. Popliteal Vein: No evidence of thrombus. Normal compressibility, respiratory phasicity and response to augmentation. Calf Veins: Limited assessment of the calf veins secondary to edema. No significant occlusive thrombus visualized. LEFT LOWER EXTREMITY Common Femoral Vein: No evidence of thrombus. Normal compressibility, respiratory phasicity and response to augmentation. Saphenofemoral Junction: No evidence of thrombus. Normal compressibility and flow on color Doppler imaging. Profunda Femoral Vein: No evidence of thrombus. Normal compressibility and flow on color Doppler imaging. Femoral Vein: No evidence of thrombus. Normal compressibility, respiratory  phasicity and response to augmentation. Popliteal Vein: No evidence of thrombus. Normal compressibility, respiratory phasicity and response to augmentation. Calf Veins: Limited assessment of the calf veins because of edema. No significant occlusive thrombus. Other Findings: Popliteal fossa Baker's cyst measures 3.4 x 1.9 x 2.9 cm. IMPRESSION: No evidence of significant DVT in either extremity. Limited assessment of the calf veins. 3.4 cm left popliteal fossa Baker's cyst. Electronically Signed   By: Jerilynn Mages.  Shick M.D.   On: 09/27/2019 16:32   DG Abd 2 Views  Result Date: 09/27/2019 CLINICAL DATA:  64 year old female with constipation. EXAM: ABDOMEN - 2 VIEW COMPARISON:  CT of the abdomen pelvis dated 02/16/2018. FINDINGS: Large amount of dense stool noted throughout the colon. There is no bowel dilatation or evidence of obstruction. No free air or radiopaque calculi. Right upper quadrant cholecystectomy clips. A single tubal ligation clip noted in the right hemipelvis. The osseous structures and soft tissues are grossly unremarkable. IMPRESSION: Constipation.  No bowel obstruction. Electronically Signed   By: Anner Crete M.D.   On: 09/27/2019 17:24    EKG: Independently reviewed. Sinus rhythm without acute ischemic changes, first-degree AV block, QTC 487.  When compared to prior, PR interval is longer.  Assessment/Plan Principal Problem:   Acute renal failure superimposed on stage 4 chronic kidney disease (HCC) Active Problems:   Asthma   Chronic systolic CHF (congestive heart failure) (HCC)   Depression with anxiety   Type 2 diabetes mellitus with diabetic chronic kidney disease (HCC)   Anemia of chronic disease   Metastatic breast cancer ([place])   Hypertension associated with diabetes ([place])   Hyperlipidemia associated with type 2 diabetes  mellitus ([place])   Hyperkalemia  Marguarite A Dalpe is a 64 y.o. female with medical history significant for CKD stage IV, metastatic left breast cancer with  osseous involvement, chronic systolic CHF, insulin-dependent type 2 diabetes, history of CVA, hypertension, hyperlipidemia, anemia of chronic kidney disease, asthma, GERD, and depression/anxiety who is admitted with AKI on CKD stage IV.   AKI on CKD stage IV: Does not appear to be significantly volume overloaded.  History suggestive of multifactorial cause in setting of decreased oral intake and recent Bactrim use.  Will start trial of IV fluids and monitor progress.  Nephrology aware and will follow. -Start IVF LR @ 125/hr x 12 hours -Monitor strict I/O's, bladder scan as needed -Obtain renal ultrasound -Discontinue Bactrim, hold Bumex and enalapril for now -Check urine sodium and urea -Nephrology to follow, appreciate further assistance  Hyperkalemia: Mild hyperkalemia in setting of AKI.  Given IV calcium gluconate in the ED.  Will give dose of Lokelma.  Anticipate further improvement with IV fluids.  Stage IV left breast cancer with osseous metastasis: Follows with oncology, completed 6 cycles of eribulin.  Chronic systolic CHF: Overall compensated without evidence of acute exacerbation.  Holding enalapril and Bumex as above.  Hypertension: Holding enalapril and Bumex as above.  Resume home amlodipine, clonidine, atenolol.  Type 2 diabetes: Continue reduced home Levemir 35 units every morning plus NovoLog 7 units twice a day with meals.  Asthma: Has some expiratory wheezing on exam without overt exacerbation.  Continue Flovent and as needed albuterol.  Constipation: Reports last bowel movement 7 days ago.  Abdominal x-ray also shows significant stool burden without SBO.  Start scheduled MiraLAX and Senokot twice daily.  Abnormal urinalysis: Without urinary symptoms.  No bacteria seen on microscopy.  Given IV ceftriaxone in the ED.  Hold further antibiotics.  Anemia of chronic disease: Chronic and stable without evidence of bleeding.  Continue to monitor.  Recent right thumb  abscess: Prior history of distal right thumb amputation, recently seen by orthopedics on 09/19/2019 for distal right thumb abscess.  Abscess was punctured and she was started on prophylactic Bactrim and cephalexin.  Appears well-healed.  Will discontinue antibiotics.  History of CVA: Continue aspirin and statin.  Hyperlipidemia: Continue simvastatin.  Depression with anxiety: Continue fluoxetine.  Generalized weakness: PT/OT eval.  DVT prophylaxis: Subcutaneous heparin Code Status: Full code, confirmed with patient Family Communication: Discussed with patient, she has discussed with family Disposition Plan: From home, discharge pending progression of renal function. Consults called: Nephrology Admission status: Observation   Zada Finders MD [facility]  If 7PM-7AM, please contact night-coverage [email or web address]  09/27/2019, 8:28 PM

## 2019-09-27 NOTE — Progress Notes (Signed)
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 Taxotere [in [place]; [name]; [name];[name]; [name]; suv 3]; [name]; [name]; [name];  [name]; Ant R iliac bone; [name]; [name]; START-Taxol weekly. March 2020- Taxol every 2 weeks [PN]; SEP 2020- PET progression  # SEP 04/29/2019- ERIBULIN s/p RT - Right hip- [s/p RT- NOV 2020]  # Poorly controlled Blood sugars- improved.   # Pancreatitis Hx/PEI- on creon in past / CKD IV [creat ~ 3-4; [name]; [name]; mild left sided weakness]  # Jan 2020-  Lobular lesion on tongue- s/p excision pyogenic granuloma [Dr.McQueen]   # GENETIC TESTING/COUNSELLING: HETEROZYGOUS Cystic Fibrosis Gene [explains hx of recurrent pancreatitis]  # MOLECULAR TESTING: NA   # PALLIATIVE  CARE: 1/22-Discussed/Declined ------------------------------------------------   DIAGNOSIS: [ 2015] BREAST CA; [name]; [name] ;GOALS: Palliative  CURRENT/MOST RECENT THERAPY: ERIBULIN [C].     Carcinoma of upper-inner quadrant of left breast in female, estrogen receptor positive ([place])  04/29/2019 -  Chemotherapy   The patient had eriBULin mesylate ([place]) 2 mg in sodium chloride 0.9 % 100 mL chemo infusion, 2 mg, Intravenous,  Once, 6 of 7 cycles Dose modification: 1 mg/m2 (original dose 1 mg/m2, Cycle 1, Reason: Provider Judgment) Administration: 2 mg (06/03/2019), 2 mg (04/29/2019), 2 mg (06/10/2019), 2 mg (07/04/2019), 2 mg (07/11/2019), 2 mg (07/25/2019), 2 mg (08/03/2019), 2 mg (08/19/2019), 2 mg (08/26/2019), 2 mg (09/09/2019), 2 mg (09/16/2019)  for chemotherapy treatment.     Interval history-patient presents to symptom management today for complaints of bilateral lower extremity swelling and pain.  Symptoms started approximately 2 weeks ago and have worsened since that time.  She was evaluated by Dr. Earley Favor on 09/20/2019 for bilateral knee pain and received steroid injections.  She was given a referral to physical therapy and to knee surgeon for possible surgical intervention.  She denies any complications from the injections. No site infection/pain. She also has history of cyst of thumb with irrigation and debridement and partial right thumb amputation.  She was started back on Keflex and Bactrim prophylactically approximately 1 week ago.   Mrs. Mielke also has history of chronic kidney disease stage IV.  She is followed by Dr. Juleen China and is currently on enalapril for renal protection.  Plan is for peritoneal dialysis when clinically indicated.  Baseline creatinine is 3-4.   She is on Erubilin renally dose reduced for metastatic breast cancer.  She is status post 6 cycles.  Her last treatment was on 09/16/2019.  She admits to constipation.  Her last bowel movement was  approximately 10 days ago.  She has a good appetite and has been eating and drinking well.  Admits to abdominal bloating but denies pain.  Has taken otc laxative/stool softener without relief.  She occasionally takes Tylenol No. 4 for pain typically due to her thumb abscess.  She denies any recent fevers or illness, easy bleeding or bruising, weight loss, chest pain, nausea, vomiting or diarrhea.  She denies any urinary concerns.  ECOG FS:1 - Symptomatic but completely ambulatory  Review of systems- Review of Systems  Constitutional: Positive for malaise/fatigue. Negative for chills, fever and weight loss.  HENT: Negative for congestion, ear pain and tinnitus.   Eyes: Negative.  Negative for blurred vision and double vision.  Respiratory: Negative.  Negative for cough, sputum production and shortness of breath.   Cardiovascular: Positive for leg swelling. Negative for chest pain and palpitations.  Gastrointestinal: Positive for constipation. Negative for abdominal pain, diarrhea, nausea and vomiting.       Abdominal bloating  Genitourinary: Negative for dysuria, frequency and urgency.  Musculoskeletal: Negative for back pain and falls.  Skin: Negative.  Negative for rash.  Neurological: Positive for dizziness and weakness. Negative for headaches.  Endo/Heme/Allergies: Negative.  Does not bruise/bleed easily.  Psychiatric/Behavioral: Negative.  Negative for depression. The patient is not nervous/anxious and does not have insomnia.      Current treatment-status post cycle 6-day 1 [place]  No Known Allergies   Past Medical History:  Diagnosis Date  . Anemia   . Anxiety   . Asthma   . Cancer ([place]) 03/10/2018   Per NM PET order. Carcinoma of upper-inner quadrant of left breast in female, estrogen receptor positive .  Marland Kitchen Cancer (HCC)    LUNG  . CHF (congestive heart failure) ([place]) 1997  . CKD (chronic kidney disease)   . Depression   . Diabetes mellitus, type 2 ([place])   . Family  history of breast cancer   . Family history of colon cancer   . Family history of ovarian cancer   . Family history of pancreatic cancer   . Family history of prostate cancer   . Family history of stomach cancer   . GERD (gastroesophageal reflux disease)    history of an ulcer  . Hair loss   . History of left breast cancer 05/29/14  . History of partial hysterectomy 12/31/2016   Per patient.  Has not had a period in years.  Had a partial hysterectomy years ago.  Marland Kitchen Hypertension   . Mitral valve regurgitation   . Neuromuscular disorder (HCC)    neuropathies in hand  . Obesity   . Pancreatitis 1997  . Stroke [facility]) 2010   with mild left arm weakness     Past Surgical History:  Procedure Laterality Date  . CATARACT EXTRACTION W/PHACO Right 02/24/2019   Procedure: CATARACT EXTRACTION PHACO AND INTRAOCULAR LENS PLACEMENT ([place]) RIGHT DIABETES;  Surgeon: Marchia Meiers, MD;  Location: [facility] ORS;  Service: Ophthalmology;  Laterality: Right;  [place] 01:13.0 CDE 7.96 Fluid Pack Lot # [id number]  . CATARACT EXTRACTION W/PHACO Left 03/24/2019   Procedure: CATARACT EXTRACTION PHACO AND INTRAOCULAR LENS PLACEMENT (I[facility]) - left diabetic;  Surgeon: Marchia Meiers, MD;  Location: [facility] ORS;  Service: Ophthalmology;  Laterality: Left;  [place]  01:36 CDE 13.93 Fluid pack lot # [id number]  . CESAREAN SECTION    .  CHOLECYSTECTOMY    . EXCISION OF TONGUE LESION N/A 08/17/2018   Procedure: EXCISION OF TONGUE LESION WITH FROZEN SECTIONS;  Surgeon: Beverly Gust, MD;  Location: [facility] ORS;  Service: ENT;  Laterality: N/A;  . EYE SURGERY Right    cataract extraction  . PARTIAL HYSTERECTOMY  12/31/2016   Per patient, she has not had a period in years since she had a partial hysterectomy.  Marland Kitchen PORTA CATH INSERTION    . TUBAL LIGATION      Social History   Socioeconomic History  . Marital status: Single    Spouse name: S.O.. .... keith  . Number of children: Not on file  . Years of education: Not on file  . Highest  education level: Not on file  Occupational History    Comment: disabled  Tobacco Use  . Smoking status: Former Smoker    Packs/day: 0.50    Years: 1.00    Pack years: 0.50    Types: Cigarettes  . Smokeless tobacco: Never Used  Substance and Sexual Activity  . Alcohol use: No    Alcohol/week: 0.0 standard drinks  . Drug use: No  . Sexual activity: Not on file    Comment: quit 8 years ago  Other Topics Concern  . Not on file  Social History Narrative  . Not on file   Social Determinants of Health   Financial Resource Strain:   . Difficulty of Paying Living Expenses: Not on file  Food Insecurity:   . Worried About Charity fundraiser in the Last Year: Not on file  . Ran Out of Food in the Last Year: Not on file  Transportation Needs:   . Lack of Transportation (Medical): Not on file  . Lack of Transportation (Non-Medical): Not on file  Physical Activity:   . Days of Exercise per Week: Not on file  . Minutes of Exercise per Session: Not on file  Stress:   . Feeling of Stress : Not on file  Social Connections:   . Frequency of Communication with Friends and Family: Not on file  . Frequency of Social Gatherings with Friends and Family: Not on file  . Attends Religious Services: Not on file  . Active Member of Clubs or Organizations: Not on file  . Attends Archivist Meetings: Not on file  . Marital Status: Not on file  Intimate Partner Violence:   . Fear of Current or Ex-Partner: Not on file  . Emotionally Abused: Not on file  . Physically Abused: Not on file  . Sexually Abused: Not on file    Family History  Problem Relation Age of Onset  . Ovarian cancer Mother 67  . Diabetes Mother   . Hypertension Mother   . COPD Father   . Hypertension Father   . Colon cancer Father 38  . Diabetes Sister   . Breast cancer Sister 76       bilateral  . Diabetes Brother   . Leukemia Maternal Aunt   . Pancreatic cancer Paternal Aunt 25  . Pancreatic cancer Paternal  Uncle   . Colon cancer Paternal Uncle   . Stomach cancer Maternal Grandfather 16  . Throat cancer Paternal Grandmother   . Breast cancer Maternal Aunt 80  . Colon cancer Maternal Aunt   . Bone cancer Maternal Aunt   . Breast cancer Paternal Aunt        dx >50  . Prostate cancer Paternal Uncle   . Pancreatic cancer Paternal Uncle   .  Throat cancer Paternal Uncle   . Lung cancer Paternal Uncle   . Stomach cancer Paternal Uncle   . Brain cancer Paternal Aunt   . Cancer Cousin        liver, kidney  . Prostate cancer Cousin        meastatic  . Lung cancer Other      Current Outpatient Medications:  .  amLODipine (NORVASC) 10 MG tablet, Take 10 mg by mouth daily. , Disp: , Rfl:  .  aspirin EC 81 MG tablet, Take 81 mg by mouth daily. , Disp: , Rfl:  .  atenolol (TENORMIN) 100 MG tablet, Take 100 mg by mouth 2 (two) times daily. , Disp: , Rfl:  .  B-D ULTRA-FINE 33 LANCETS MISC, Use 1 each 2 (two) times daily., Disp: , Rfl:  .  B-D ULTRAFINE III SHORT PEN 31G X 8 MM MISC, , Disp: , Rfl:  .  bumetanide (BUMEX) 0.5 MG tablet, Take 0.5 mg by mouth 2 (two) times daily. , Disp: , Rfl:  .  cephALEXin (KEFLEX) 500 MG capsule, Take 1 capsule by mouth every 6 (six) hours., Disp: , Rfl:  .  Cinnamon 500 MG capsule, Take 1,000 mg by mouth daily. , Disp: , Rfl:  .  cloNIDine (CATAPRES) 0.2 MG tablet, Take 0.2 mg by mouth 2 (two) times daily. , Disp: , Rfl:  .  diflorasone (PSORCON) 0.05 % ointment, Apply 1 application topically daily. , Disp: , Rfl:  .  ELDERBERRY PO, Take 1 tablet by mouth daily., Disp: , Rfl:  .  enalapril (VASOTEC) 10 MG tablet, Take 20 mg by mouth 2 (two) times a day. , Disp: , Rfl:  .  famotidine (PEPCID) 20 MG tablet, Take 1 tablet by mouth daily., Disp: , Rfl:  .  ferrous sulfate 325 (65 FE) MG tablet, Take 325 mg by mouth 2 (two) times daily with a meal., Disp: , Rfl:  .  FLUoxetine (PROZAC) 20 MG capsule, Take 20 mg by mouth 2 (two) times daily. , Disp: , Rfl:  .   Fluticasone Propionate, Inhal, (FLOVENT DISKUS) 100 MCG/BLIST AEPB, Inhale 2 puffs into the lungs 2 (two) times a day. Inhale 2 inhalations into the lungs 2 times daily PRN , Disp: , Rfl:  .  gabapentin (NEURONTIN) 100 MG capsule, TAKE 1 CAPSULE(100 MG) BY MOUTH AT BEDTIME (Patient taking differently: Take 100 mg by mouth at bedtime. ), Disp: 30 capsule, Rfl: 6 .  glucose blood (ONE TOUCH ULTRA TEST) test strip, Use 1 each 2 (two) times daily. Use as instructed., Disp: , Rfl:  .  glyBURIDE (DIABETA) 5 MG tablet, Take 10 mg by mouth 2 (two) times daily with a meal. , Disp: , Rfl:  .  LEVEMIR FLEXTOUCH 100 UNIT/ML Pen, Inject 55 Units into the skin daily. , Disp: , Rfl:  .  mometasone (NASONEX) 50 MCG/ACT nasal spray, Place 2 sprays into the nose daily as needed (Allergies). , Disp: , Rfl:  .  NOVOLOG FLEXPEN 100 UNIT/ML FlexPen, Inject 7 Units into the skin 2 (two) times daily. , Disp: , Rfl:  .  simvastatin (ZOCOR) 20 MG tablet, Take 20 mg by mouth daily at 6 PM. , Disp: , Rfl:  .  sulfamethoxazole-trimethoprim (BACTRIM DS) 800-160 MG tablet, Take 1 tablet by mouth in the morning and at bedtime., Disp: , Rfl:  .  vitamin B-12 (CYANOCOBALAMIN) 1000 MCG tablet, Take 1,000 mcg by mouth daily., Disp: , Rfl:  .  acetaminophen-codeine (TYLENOL #4)  300-60 MG tablet, Take 1-2 tablets by mouth every 4 (four) hours as needed for moderate pain. , Disp: , Rfl:  .  albuterol (PROAIR HFA) 108 (90 BASE) MCG/ACT inhaler, Inhale 2 puffs into the lungs every 6 (six) hours as needed for wheezing or shortness of breath. , Disp: , Rfl:  .  albuterol (PROVENTIL) (2.5 MG/3ML) 0.083% nebulizer solution, Inhale 2.5 mg into the lungs every 6 (six) hours as needed for shortness of breath. , Disp: , Rfl:  .  ALPRAZolam (XANAX) 0.5 MG tablet, Take 0.5 mg by mouth at bedtime as needed for anxiety or sleep. , Disp: , Rfl:  .  calcitRIOL (ROCALTROL) 0.25 MCG capsule, Take 0.25 mcg by mouth daily. , Disp: , Rfl:  .  loperamide  (IMODIUM A-D) 2 MG capsule, Take 2-4 mg by mouth 4 (four) times daily as needed for diarrhea or loose stools. , Disp: , Rfl:  .  ondansetron (ZOFRAN) 8 MG tablet, One pill every 8 hours as needed for nausea/vomitting. (Patient not taking: Reported on 09/27/2019), Disp: 40 tablet, Rfl: 1 .  prochlorperazine (COMPAZINE) 10 MG tablet, Take 1 tablet (10 mg total) by mouth every 6 (six) hours as needed for nausea or vomiting. Please note change in strength (Patient not taking: Reported on 09/27/2019), Disp: 60 tablet, Rfl: 4 .  salmeterol (SEREVENT) 50 MCG/DOSE diskus inhaler, Inhale 1 puff into the lungs daily as needed (shortness of breath). , Disp: , Rfl:  No current facility-administered medications for this visit.  Facility-Administered Medications Ordered in Other Visits:  .  sodium chloride flush (NS) 0.9 % injection 10 mL, 10 mL, Intravenous, PRN, Charlaine Dalton R, MD, 10 mL at 01/30/16 1054  Physical exam:  Vitals:   09/27/19 1330 09/27/19 1357  BP: 92/63   Pulse: 64   Resp: 20   Temp: (!) 96.9 F (36.1 C)   TempSrc: Tympanic   SpO2: 100%   Weight:  198 lb (89.8 kg)  Height:  '5\' 3"'  (1.6 m)   Physical Exam Vitals reviewed.  Constitutional:      Appearance: Normal appearance.  HENT:     Head: Normocephalic and atraumatic.  Eyes:     Pupils: Pupils are equal, round, and reactive to light.  Cardiovascular:     Rate and Rhythm: Normal rate and regular rhythm.     Heart sounds: Normal heart sounds. No murmur.  Pulmonary:     Effort: Pulmonary effort is normal.     Breath sounds: Normal breath sounds. No wheezing.  Abdominal:     General: Bowel sounds are normal. There is no distension.     Palpations: Abdomen is soft.     Tenderness: There is no abdominal tenderness.     Comments: bloating  Musculoskeletal:        General: Normal range of motion.     Cervical back: Normal range of motion.     Right lower leg: Edema present.     Left lower leg: Edema present.  Skin:     General: Skin is warm and dry.     Findings: No rash.  Neurological:     Mental Status: She is alert and oriented to person, place, and time.  Psychiatric:        Judgment: Judgment normal.      CMP Latest Ref Rng & Units 09/16/2019  Glucose 70 - 99 mg/dL 113(H)  BUN 8 - 23 mg/dL 36(H)  Creatinine 0.44 - 1.00 mg/dL 3.21(H)  Sodium 135 - 145 mmol/L  135  Potassium 3.5 - 5.1 mmol/L 4.2  Chloride 98 - 111 mmol/L 104  CO2 22 - 32 mmol/L 21(L)  Calcium 8.9 - 10.3 mg/dL 8.7(L)  Total Protein 6.5 - 8.1 g/dL 7.2  Total Bilirubin 0.3 - 1.2 mg/dL 0.6  Alkaline Phos 38 - 126 U/L 56  AST 15 - 41 U/L 27  ALT 0 - 44 U/L 25   CBC Latest Ref Rng & Units 09/27/2019  WBC 4.0 - 10.5 K/uL 7.9  Hemoglobin 12.0 - 15.0 g/dL 9.8(L)  Hematocrit 36.0 - 46.0 % 29.3(L)  Platelets 150 - 400 K/uL 379    No images are attached to the encounter.  No results found.  Assessment and plan- Patient is a 64 y.o. female who presents to symptom management for complaints of bilateral lower extremity swelling and constipation for about 2 weeks.  Left breast cancer stage IV: ER PR positive HER-2/neu negative.  Currently on Eribulin-recently completed cycle 6.  Most recent imaging from 08/09/2019 revealed partial response; stable with a rising tumor marker.  She has bony mets to right acetabular status post radiation with improvement noted.  She is scheduled to return to clinic on 09/30/2019 for lab work and consideration of cycle 7.  Bilateral lower extremity swelling: Unclear etiology.  Differentials include bilateral DVT, nephrotic syndrome (hx stage IV CKD), congestive heart failure, cellulitis from recent steroid injections or medication induced.  We will review her medications.  She was recently started on Keflex and Bactrim for thumb abscess.  Constipation: Patient states she is not had a bowel movement in approximately 10 days.  She admits to passing gas and belching.  Abdomen is soft and bowel sounds auscultated.  Will complete work-up with imaging.   Plan: Lab work-CBC, CMP, BNP Stat bilateral ultrasound to rule out DVT-Negative Stat abdominal and chest x-ray  Labs revealed Creatinine of 7.30 and BUN 99. Normal BNP.   Dr. Juleen China and Dr. Rogue Bussing notified-recommend ed evaluation  Dr. Juleen China will consult either tonight or tomorrow.   Disposition: Patient to be evaluated in the emergency department ASAP given acute renal failure.    Visit Diagnosis 1. Bilateral edema of lower extremity   2. Bilateral leg pain   3. Carcinoma of upper-inner quadrant of left breast in female, estrogen receptor positive ([place])     Patient expressed understanding and was in agreement with this plan. She also understands that She can call clinic at any time with any questions, concerns, or complaints.   Greater than 50% was spent in counseling and coordination of care with this patient including but not limited to discussion of the relevant topics above (See A&P) including, but not limited to diagnosis and management of acute and chronic medical conditions.   Thank you for allowing me to participate in the care of this very pleasant patient.    Jacquelin Hawking, NP [place] [facility] Cell - [phone] Pager- [phone] 09/27/2019 2:53 PM

## 2019-09-28 ENCOUNTER — Observation Stay: Payer: Medicare HMO

## 2019-09-28 DIAGNOSIS — Z803 Family history of malignant neoplasm of breast: Secondary | ICD-10-CM | POA: Diagnosis not present

## 2019-09-28 DIAGNOSIS — E872 Acidosis, unspecified: Secondary | ICD-10-CM

## 2019-09-28 DIAGNOSIS — E1122 Type 2 diabetes mellitus with diabetic chronic kidney disease: Secondary | ICD-10-CM | POA: Diagnosis present

## 2019-09-28 DIAGNOSIS — N170 Acute kidney failure with tubular necrosis: Secondary | ICD-10-CM | POA: Diagnosis present

## 2019-09-28 DIAGNOSIS — E1159 Type 2 diabetes mellitus with other circulatory complications: Secondary | ICD-10-CM | POA: Diagnosis present

## 2019-09-28 DIAGNOSIS — I130 Hypertensive heart and chronic kidney disease with heart failure and stage 1 through stage 4 chronic kidney disease, or unspecified chronic kidney disease: Secondary | ICD-10-CM | POA: Diagnosis present

## 2019-09-28 DIAGNOSIS — I129 Hypertensive chronic kidney disease with stage 1 through stage 4 chronic kidney disease, or unspecified chronic kidney disease: Secondary | ICD-10-CM | POA: Diagnosis not present

## 2019-09-28 DIAGNOSIS — N179 Acute kidney failure, unspecified: Secondary | ICD-10-CM | POA: Diagnosis present

## 2019-09-28 DIAGNOSIS — C7951 Secondary malignant neoplasm of bone: Secondary | ICD-10-CM | POA: Diagnosis present

## 2019-09-28 DIAGNOSIS — R296 Repeated falls: Secondary | ICD-10-CM | POA: Diagnosis present

## 2019-09-28 DIAGNOSIS — I5022 Chronic systolic (congestive) heart failure: Secondary | ICD-10-CM | POA: Diagnosis present

## 2019-09-28 DIAGNOSIS — E1142 Type 2 diabetes mellitus with diabetic polyneuropathy: Secondary | ICD-10-CM | POA: Diagnosis present

## 2019-09-28 DIAGNOSIS — N184 Chronic kidney disease, stage 4 (severe): Secondary | ICD-10-CM

## 2019-09-28 DIAGNOSIS — D631 Anemia in chronic kidney disease: Secondary | ICD-10-CM | POA: Diagnosis present

## 2019-09-28 DIAGNOSIS — E875 Hyperkalemia: Secondary | ICD-10-CM | POA: Diagnosis present

## 2019-09-28 DIAGNOSIS — E785 Hyperlipidemia, unspecified: Secondary | ICD-10-CM | POA: Diagnosis present

## 2019-09-28 DIAGNOSIS — I10 Essential (primary) hypertension: Secondary | ICD-10-CM | POA: Diagnosis not present

## 2019-09-28 DIAGNOSIS — D638 Anemia in other chronic diseases classified elsewhere: Secondary | ICD-10-CM | POA: Diagnosis not present

## 2019-09-28 DIAGNOSIS — J45909 Unspecified asthma, uncomplicated: Secondary | ICD-10-CM | POA: Diagnosis present

## 2019-09-28 DIAGNOSIS — T368X5A Adverse effect of other systemic antibiotics, initial encounter: Secondary | ICD-10-CM | POA: Diagnosis present

## 2019-09-28 DIAGNOSIS — C50912 Malignant neoplasm of unspecified site of left female breast: Secondary | ICD-10-CM | POA: Diagnosis present

## 2019-09-28 DIAGNOSIS — K5900 Constipation, unspecified: Secondary | ICD-10-CM | POA: Diagnosis present

## 2019-09-28 DIAGNOSIS — Z8673 Personal history of transient ischemic attack (TIA), and cerebral infarction without residual deficits: Secondary | ICD-10-CM | POA: Diagnosis not present

## 2019-09-28 DIAGNOSIS — Z87891 Personal history of nicotine dependence: Secondary | ICD-10-CM | POA: Diagnosis not present

## 2019-09-28 DIAGNOSIS — N2581 Secondary hyperparathyroidism of renal origin: Secondary | ICD-10-CM | POA: Diagnosis present

## 2019-09-28 DIAGNOSIS — Z170 Estrogen receptor positive status [ER+]: Secondary | ICD-10-CM | POA: Diagnosis not present

## 2019-09-28 DIAGNOSIS — Z20822 Contact with and (suspected) exposure to covid-19: Secondary | ICD-10-CM | POA: Diagnosis present

## 2019-09-28 DIAGNOSIS — F418 Other specified anxiety disorders: Secondary | ICD-10-CM | POA: Diagnosis present

## 2019-09-28 LAB — RENAL FUNCTION PANEL
Albumin: 3.4 g/dL — ABNORMAL LOW (ref 3.5–5.0)
Anion gap: 10 (ref 5–15)
BUN: 106 mg/dL — ABNORMAL HIGH (ref 8–23)
CO2: 17 mmol/L — ABNORMAL LOW (ref 22–32)
Calcium: 7.6 mg/dL — ABNORMAL LOW (ref 8.9–10.3)
Chloride: 107 mmol/L (ref 98–111)
Creatinine, Ser: 6.91 mg/dL — ABNORMAL HIGH (ref 0.44–1.00)
GFR calc Af Amer: 7 mL/min — ABNORMAL LOW (ref >60)
GFR calc non Af Amer: 6 mL/min — ABNORMAL LOW (ref >60)
Glucose, Bld: 150 mg/dL — ABNORMAL HIGH (ref 70–99)
Phosphorus: 8.6 mg/dL — ABNORMAL HIGH (ref 2.5–4.6)
Potassium: 5.6 mmol/L — ABNORMAL HIGH (ref 3.5–5.1)
Sodium: 134 mmol/L — ABNORMAL LOW (ref 135–145)

## 2019-09-28 LAB — CBC
HCT: 24 % — ABNORMAL LOW (ref 36.0–46.0)
Hemoglobin: 8 g/dL — ABNORMAL LOW (ref 12.0–15.0)
MCH: 32.1 pg (ref 26.0–34.0)
MCHC: 33.3 g/dL (ref 30.0–36.0)
MCV: 96.4 fL (ref 80.0–100.0)
Platelets: 311 10*3/uL (ref 150–400)
RBC: 2.49 MIL/uL — ABNORMAL LOW (ref 3.87–5.11)
RDW: 14.4 % (ref 11.5–15.5)
WBC: 6.1 10*3/uL (ref 4.0–10.5)
nRBC: 0 % (ref 0.0–0.2)

## 2019-09-28 LAB — GLUCOSE, CAPILLARY
Glucose-Capillary: 132 mg/dL — ABNORMAL HIGH (ref 70–99)
Glucose-Capillary: 114 mg/dL — ABNORMAL HIGH (ref 70–99)
Glucose-Capillary: 124 mg/dL — ABNORMAL HIGH (ref 70–99)
Glucose-Capillary: 151 mg/dL — ABNORMAL HIGH (ref 70–99)
Glucose-Capillary: 193 mg/dL — ABNORMAL HIGH (ref 70–99)

## 2019-09-28 LAB — URINE CULTURE: Culture: NO GROWTH

## 2019-09-28 LAB — HEPATITIS B SURFACE ANTIBODY,QUALITATIVE: Hep B S Ab: NONREACTIVE

## 2019-09-28 LAB — HIV ANTIBODY (ROUTINE TESTING W REFLEX): HIV Screen 4th Generation wRfx: NONREACTIVE

## 2019-09-28 LAB — HEPATITIS B SURFACE ANTIGEN: Hepatitis B Surface Ag: NONREACTIVE

## 2019-09-28 LAB — SARS CORONAVIRUS 2 BY RT PCR (DIASORIN): SARS Coronavirus 2: NEGATIVE

## 2019-09-28 LAB — POTASSIUM: Potassium: 5.1 mmol/L (ref 3.5–5.1)

## 2019-09-28 MED ORDER — TRAMADOL HCL 50 MG PO TABS: 50.0000 mg | ORAL_TABLET | Freq: Three times a day (TID) | ORAL | Status: DC | PRN

## 2019-09-28 MED ORDER — SODIUM ZIRCONIUM CYCLOSILICATE 10 G PO PACK
10.0000 g | PACK | Freq: Every day | ORAL | Status: AC
  Administered 2019-09-28: 11:00:00 10 g via ORAL
  Filled 2019-09-28: qty 1

## 2019-09-28 MED ORDER — SODIUM BICARBONATE-DEXTROSE 150-5 MEQ/L-% IV SOLN
150.0000 meq | INTRAVENOUS | Status: DC
  Administered 2019-09-28 – 2019-10-01 (×4): 150 meq via INTRAVENOUS
  Filled 2019-09-28 (×5): qty 1000

## 2019-09-28 MED ORDER — CHLORHEXIDINE GLUCONATE CLOTH 2 % EX PADS
6.0000 | MEDICATED_PAD | Freq: Every day | CUTANEOUS | Status: DC
  Administered 2019-09-28 – 2019-10-01 (×4): 6 via TOPICAL

## 2019-09-28 NOTE — Evaluation (Addendum)
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   Clinical Impression   Pt was seen for OT evaluation this date. Prior to hospital admission, pt was Indep with ADLs and IADLs including driving and laundry, but endorses not feeling like driving in past 2 weeks and sitting to fold laundry d/t increased fatigue. Pt lives with a friend in [facility] with 2 STE and started using 4WW for fxl mobility in recent weeks d/t increased feeling od weakness or "like my knees wanted to give out". Currently pt demonstrates impairments as described below (See OT problem list) which functionally limit her ability to perform ADL/self-care tasks. Pt currently requires MIN A/CGA for ADL transfers, CGA with RW for fxl mobility, MOD A for seated LB dressing, and setup for seated UB ADLs.  Pt would benefit from skilled OT to address noted impairments and functional limitations (see below for any additional details) in order to maximize safety and independence while minimizing falls risk and caregiver burden. Upon hospital discharge, recommend HHOT and intermittent supv to maximize pt safety and return to functional independence during meaningful occupations of daily life. Of note, pt with elevated potassium this date (5.6), okay'ed for therapy by attending, Dr. Jimmye Norman, HR monitored throughout at 68-78bpm. Pt tolerated session well overall.    Follow Up Recommendations  Home health OT;Supervision - Intermittent(supv for OOB fxl mobility/ADL transfers at least initially on d/c)    Equipment Recommendations  3 in 1 bedside commode;Other (comment)(grab bars in shower)    Recommendations for Other Services        Precautions / Restrictions Precautions Precautions: Fall Restrictions Weight Bearing Restrictions: No      Mobility Bed Mobility Overal bed mobility: Modified Independent             General bed mobility comments: requires extended time, uses bed rail  Transfers Overall transfer level: Needs assistance Equipment used: Rolling walker (2 wheeled) Transfers: Sit to/from Stand Sit to Stand: Min guard;Min assist              Balance Overall balance assessment: Needs assistance Sitting-balance support: Feet supported Sitting balance-Leahy Scale: Normal     Standing balance support: Bilateral upper extremity supported Standing balance-Leahy Scale: Fair Standing balance comment: CGA and reliance on UE support for static stand.                           ADL either performed or assessed with clinical judgement   ADL                                         General ADL Comments: Pt requires setup for seated UB ADLs including self-feeding and grooming d/t limited sensation/FMC of R thumb (hx distal phalanx removed). Pt requires MOD A with LB dressing in sitting which she endorses is a struggle at baseline, but she can usually complete task. Requires MIN A/CGA for ADL transfers with RW and CGA for fxl mobility with RW.     Vision Patient Visual Report: No change from baseline Additional Comments: pt reports hx  vision sxs including cataract removal.     Perception     Praxis      Pertinent Vitals/Pain Pain Assessment: No/denies pain     Hand Dominance Right(part of R thumb removed after accident closing door on thumb and poor healing d/t DM per pt report.)   Extremity/Trunk Assessment Upper Extremity Assessment Upper Extremity Assessment: Overall WFL for tasks assessed;RUE deficits/detail;LUE deficits/detail RUE Deficits / Details: Pt L UE very mildly weaker than R UE, detectable on grip assessment (R grip MMT 4+/5). increased difficulty  opening containers/packets for lunch with R hand d/t limited sensation in thumb RUE Sensation: decreased light touch RUE Coordination: decreased fine motor LUE Deficits / Details: L grip MMT 4/5, shld and elbow flex/ext 4/5 and pt moves through full arc of motion bilaterally   Lower Extremity Assessment Lower Extremity Assessment: Defer to PT evaluation;Overall [facility] for tasks assessed   Cervical / Trunk Assessment Cervical / Trunk Assessment: Normal   Communication Communication Communication: No difficulties   Cognition Arousal/Alertness: Awake/alert Behavior During Therapy: WFL for tasks assessed/performed Overall Cognitive Status: Within Functional Limits for tasks assessed                                     General Comments       Exercises Other Exercises Other Exercises: OT facilitates education re: role of OT in acute setting including potential need for AE education for LB dressing/bathing. Pt agreeable and verbalized understanding.   Shoulder Instructions      Home Living Family/patient expects to be discharged to:: Private residence Living Arrangements: Non-relatives/Friends Available Help at Discharge: Friend(s);Available 24 hours/day(friend is retired and pt reports this person is able to help.) Type of Home: House Home Access: Stairs to enter [employer] of Steps: 2 Entrance Stairs-Rails: None Home Layout: One level     Bathroom Shower/Tub: Teacher, early years/pre: Standard     Home Equipment: Environmental consultant - 4 wheels          Prior Functioning/Environment Level of Independence: Independent with assistive device(s)        Comments: pt was using 4WW within recent weeks d/t weakness, but typically uses no AD. Able to perform IADLs like cooking and cleaning, but does sit to rest intermittently with these tasks and has been tolerating less in recent week before admission. + driving until ~0YTK ago per pt.        OT  Problem List: Decreased strength;Decreased activity tolerance;Impaired balance (sitting and/or standing)      OT Treatment/Interventions: Self-care/ADL training;Therapeutic exercise;Energy conservation;DME and/or AE instruction;Therapeutic activities;Patient/family education;Balance training    OT Goals(Current goals can be found in the care plan section) Acute Rehab OT Goals Patient Stated Goal: to get around better and do what I was able to do a few weeks ago. OT Goal Formulation: With patient Time For Goal Achievement: 10/12/19 Potential to Achieve Goals: Good  OT Frequency: Min 2X/week   Barriers to D/C:            Co-evaluation              AM-PAC OT "6 Clicks" Daily Activity     Outcome Measure Help from another person eating meals?: A Little(setup) Help from another person taking care of personal grooming?: A Little Help from another person toileting, which includes using toliet, bedpan, or urinal?: A Little Help from another person bathing (including washing, rinsing, drying)?: A  Lot Help from another person to put on and taking off regular upper body clothing?: A Little Help from another person to put on and taking off regular lower body clothing?: A Lot 6 Click Score: 16   End of Session Equipment Utilized During Treatment: Gait belt;Rolling walker Nurse Communication: Mobility status;Other (comment)(notified RN of HR being stable between 68-78bpm throughout session.)  Activity Tolerance: Patient tolerated treatment well Patient left: in bed;with call bell/phone within reach(seated EOB with bed alarm off as MD present and PT presenting for evaluation)  OT Visit Diagnosis: Unsteadiness on feet (R26.81);History of falling (Z91.81)                Time: 8372-9021 OT Time Calculation (min): 54 min Charges:  OT General Charges $OT Visit: 1 Visit OT Evaluation $OT Eval Moderate Complexity: 1 Mod OT Treatments $Self Care/Home Management : 23-37 mins $Therapeutic  Activity: 8-22 mins  Gerrianne Scale, MS, OTR/L ascom [phone] 09/28/19, 10:16 AM

## 2019-09-28 NOTE — Progress Notes (Signed)
[facility]  ROUNDING NOTE   Subjective:   [name] admitted to [facility] on 09/27/2019 for Acute renal failure superimposed on stage 4 chronic kidney disease ([place]) [N17.9, N18.4] Acute renal failure superimposed on stage 4 chronic kidney disease, unspecified acute renal failure type ([place]) [N17.9, N18.4]  Patient states she started having increasing shortness of breath, abdominal distention and peripheral edema. Also with lower extremity weakness. Denies use of nonsteroidal anti-inflammatory agents.   Recent steroid injections to her bilateral knees on 2/15.   Objective:  Vital signs in last 24 hours:  Temp:  [96.9 F (36.1 C)-98.4 F (36.9 C)] 98.4 F (36.9 C) (02/24 0750) Pulse Rate:  [64-72] 69 (02/24 0750) Resp:  [17-22] 18 (02/23 2202) BP: (92-118)/(63-71) 106/71 (02/24 0750) SpO2:  [97 %-100 %] 99 % (02/24 0750) Weight:  [81.6 kg-92.5 kg] 81.6 kg (02/24 0500)  Weight change:  Filed Weights   09/27/19 1708 09/27/19 2202 09/28/19 0500  Weight: 89.8 kg 92.5 kg 81.6 kg    Intake/Output: I/O last 3 completed shifts: In: 956.4 [P.O.:200; I.V.:714.1; IV Piggyback:42.3] Out: 400 [Urine:400]   Intake/Output this shift:  No intake/output data recorded.  Physical Exam: General: NAD,   Head: Normocephalic, atraumatic. Moist oral mucosal membranes  Eyes: Anicteric, PERRL  Neck: Supple, trachea midline  Lungs:  Clear to auscultation  Heart: Regular rate and rhythm  Abdomen:  Soft, nontender,   Extremities:  + peripheral edema.  Neurologic: Nonfocal, moving all four extremities  Skin: No lesions  Access: none    Basic Metabolic Panel: Recent Labs  Lab 09/27/19 1406 09/27/19 2242 09/28/19 0439  NA 131* 134* 134*  K 5.9* 5.4* 5.6*  CL 100 105 107  CO2 17* 16* 17*  GLUCOSE 115* 95 150*  BUN 99* 99* 106*  CREATININE 7.30* 6.91* 6.91*  CALCIUM 7.9* 7.8* 7.6*  PHOS  --  8.5* 8.6*    Liver Function Tests: Recent Labs  Lab 09/27/19 1406  09/27/19 2242 09/28/19 0439  AST 23  --   --   ALT 26  --   --   ALKPHOS 62  --   --   BILITOT 0.4  --   --   PROT 8.0  --   --   ALBUMIN 4.3 4.0 3.4*   No results for input(s): LIPASE, AMYLASE in the last 168 hours. No results for input(s): AMMONIA in the last 168 hours.  CBC: Recent Labs  Lab 09/27/19 1406 09/28/19 0439  WBC 7.9 6.1  NEUTROABS 5.6  --   HGB 9.8* 8.0*  HCT 29.3* 24.0*  MCV 96.1 96.4  PLT 379 311    Cardiac Enzymes: No results for input(s): CKTOTAL, CKMB, CKMBINDEX, TROPONINI in the last 168 hours.  BNP: Invalid input(s): POCBNP  CBG: Recent Labs  Lab 09/27/19 2306 09/28/19 0558 09/28/19 0751  GLUCAP 90 132* 114*    Microbiology: Results for orders placed or performed during the hospital encounter of 09/27/19  SARS Coronavirus 2 by RT PCR     Status: None   Collection Time: 09/27/19  9:05 PM  Result Value Ref Range Status   SARS Coronavirus 2 NEGATIVE NEGATIVE Final    Comment: (NOTE) Result indicates the ABSENCE of SARS-CoV-2 RNA in the patient specimen.  The lowest concentration of SARS-CoV-2 viral copies this assay can detect in nasopharyngeal swab specimens is 500 copies / mL.  A negative result does not preclude SARS-CoV-2 infection and should not be used as the sole basis for patient management  decisions. A negative result may occur with improper specimen collection / handling, submission of a specimen other than nasopharyngeal swab, presence of viral mutation(s) within the areas targeted by this assay, and inadequate number of viral copies (<500 copies / mL) present.  Negative results must be combined with clinical observations, patient history, and epidemiological information.  The expected result is NEGATIVE.  Patient Fact Sheet:  BlogSelections.co.uk   Provider Fact Sheet:  https://lucas.com/   This test is not yet approved or cleared by the [place] FDA and  has been  authorized for  detection and/or diagnosis of SARS-CoV-2 by FDA under an Emergency Use Authorization (EUA).  This EUA will remain in effect (meaning this test can be used) for the duration of  the COVID-19 declaration under Section 564(b)(1) of the Act, 21 U.S.C. section 360bbb-3(b)(1), unless the authorization is terminated or revoked sooner Performed at [facility], [place] [address] [place], [place] [address]     Coagulation Studies: No results for input(s): LABPROT, INR in the last 72 hours.  Urinalysis: Recent Labs    09/27/19 1900  COLORURINE YELLOW*  LABSPEC 1.012  PHURINE 5.0  GLUCOSEU NEGATIVE  HGBUR NEGATIVE  BILIRUBINUR NEGATIVE  KETONESUR NEGATIVE  PROTEINUR 30*  NITRITE NEGATIVE  LEUKOCYTESUR LARGE*      Imaging: DG Chest 2 View  Result Date: 09/27/2019 CLINICAL DATA:  64 year old female with constipation. EXAM: CHEST - 2 VIEW COMPARISON:  Chest CT dated 02/16/2018. FINDINGS: Right-sided Port-A-Cath with tip over central SVC. Minimal left lung base subsegmental atelectasis. No focal consolidation, pleural effusion, or pneumothorax. The cardiac silhouette is within normal limits. No acute osseous pathology. IMPRESSION: No active cardiopulmonary disease. Electronically Signed   By: Anner Crete M.D.   On: 09/27/2019 17:23   US RENAL  Result Date: 09/28/2019 CLINICAL DATA:  Acute renal failure superimposed on stage 4 chronic kidney disease. EXAM: RENAL / URINARY TRACT ULTRASOUND COMPLETE COMPARISON:  September 18, 2015. FINDINGS: Right Kidney: Renal measurements: 10.1 x 4.9 x 4.2 cm = volume: 107 mL. Increased echogenicity of renal parenchyma is noted. 2 simple cysts are noted, the largest measuring 1.8 cm in midpole. No mass or hydronephrosis visualized. Left Kidney: Renal measurements: 10.3 x 4.8 x 4.2 cm = volume: 103 mL. Increased echogenicity of renal parenchyma is noted suggesting medical renal disease. 6 mm simple cyst is seen in upper pole. No mass or  hydronephrosis visualized. Bladder: Appears normal for degree of bladder distention. Other: None. IMPRESSION: Increased echogenicity of renal parenchyma is noted bilaterally suggesting medical renal disease. No hydronephrosis or renal obstruction is noted. Bilateral renal cysts are noted. Electronically Signed   By: Marijo Conception M.D.   On: 09/28/2019 10:33   US Venous Img Lower Bilateral  Result Date: 09/27/2019 CLINICAL DATA:  Bilateral lower extremity edema and pain EXAM: BILATERAL LOWER EXTREMITY VENOUS DOPPLER ULTRASOUND TECHNIQUE: Gray-scale sonography with graded compression, as well as color Doppler and duplex ultrasound were performed to evaluate the lower extremity deep venous systems from the level of the common femoral vein and including the common femoral, femoral, profunda femoral, popliteal and calf veins including the posterior tibial, peroneal and gastrocnemius veins when visible. The superficial great saphenous vein was also interrogated. Spectral Doppler was utilized to evaluate flow at rest and with distal augmentation maneuvers in the common femoral, femoral and popliteal veins. COMPARISON:  None. FINDINGS: RIGHT LOWER EXTREMITY Common Femoral Vein: No evidence of thrombus. Normal compressibility, respiratory phasicity and response to augmentation. Saphenofemoral Junction: No  evidence of thrombus. Normal compressibility and flow on color Doppler imaging. Profunda Femoral Vein: No evidence of thrombus. Normal compressibility and flow on color Doppler imaging. Femoral Vein: No evidence of thrombus. Normal compressibility, respiratory phasicity and response to augmentation. Popliteal Vein: No evidence of thrombus. Normal compressibility, respiratory phasicity and response to augmentation. Calf Veins: Limited assessment of the calf veins secondary to edema. No significant occlusive thrombus visualized. LEFT LOWER EXTREMITY Common Femoral Vein: No evidence of thrombus. Normal compressibility,  respiratory phasicity and response to augmentation. Saphenofemoral Junction: No evidence of thrombus. Normal compressibility and flow on color Doppler imaging. Profunda Femoral Vein: No evidence of thrombus. Normal compressibility and flow on color Doppler imaging. Femoral Vein: No evidence of thrombus. Normal compressibility, respiratory phasicity and response to augmentation. Popliteal Vein: No evidence of thrombus. Normal compressibility, respiratory phasicity and response to augmentation. Calf Veins: Limited assessment of the calf veins because of edema. No significant occlusive thrombus. Other Findings: Popliteal fossa Baker's cyst measures 3.4 x 1.9 x 2.9 cm. IMPRESSION: No evidence of significant DVT in either extremity. Limited assessment of the calf veins. 3.4 cm left popliteal fossa Baker's cyst. Electronically Signed   By: Jerilynn Mages.  Shick M.D.   On: 09/27/2019 16:32   DG Abd 2 Views  Result Date: 09/27/2019 CLINICAL DATA:  64 year old female with constipation. EXAM: ABDOMEN - 2 VIEW COMPARISON:  CT of the abdomen pelvis dated 02/16/2018. FINDINGS: Large amount of dense stool noted throughout the colon. There is no bowel dilatation or evidence of obstruction. No free air or radiopaque calculi. Right upper quadrant cholecystectomy clips. A single tubal ligation clip noted in the right hemipelvis. The osseous structures and soft tissues are grossly unremarkable. IMPRESSION: Constipation.  No bowel obstruction. Electronically Signed   By: Anner Crete M.D.   On: 09/27/2019 17:24     Medications:   . sodium bicarbonate 150 mEq in dextrose 5% 1000 mL     . amLODipine  10 mg Oral Daily  . aspirin EC  81 mg Oral Daily  . atenolol  100 mg Oral BID  . budesonide (PULMICORT) nebulizer solution  0.25 mg Nebulization BID  . calcitRIOL  0.25 mcg Oral Daily  . Chlorhexidine Gluconate Cloth  6 each Topical Q0600  . cloNIDine  0.2 mg Oral BID  . famotidine  10 mg Oral Daily  . FLUoxetine  20 mg Oral BID   . gabapentin  100 mg Oral QHS  . heparin  5,000 Units Subcutaneous Q8H  . insulin aspart  5 Units Subcutaneous BID WC  . insulin detemir  35 Units Subcutaneous Daily  . polyethylene glycol  17 g Oral BID  . senna-docusate  2 tablet Oral BID  . simvastatin  20 mg Oral QPM  . sodium chloride flush  3 mL Intravenous Q12H   acetaminophen **OR** acetaminophen, albuterol, ALPRAZolam, Melatonin, oxyCODONE-acetaminophen, traMADol  Assessment/ Plan:  [name] is a 64 y.o. black female with metastatic breast cancer, diabetes mellitus type II insulin dependent, asthma, CVA, hypertension, congestive heart failure, depression, hypertension and hyperlipidemia.   1. Acute renal failure on chronic kidney disease stage IV: baseline creatinine of 3.21, GFR of 17 on 09/16/19.  2. Hyperkalemia 3. Metabolic acidosis 4. Anemia with chronic kidney disease 5. Secondary Hyperparathyroidism 6. Hypertension with chronic kidney disease  Plan  Acute renal failure seems secondary to bactrim however prerenal azotemia cannot be ruled out.  No indication for dialysis.  Change IV fluids to sodium bicarbonate   LOS: 0 Alaisha Eversley  2/24/202111:44 AM

## 2019-09-28 NOTE — TOC Initial Note (Signed)
Transition of Care [facility]) - Initial/Assessment Note    Patient Details  Name: [name] MRN: [id number] Date of Birth: 06-25-1956  Transition [facility]) CM/SW Contact:    Su Hilt, RN Phone Number: 09/28/2019, 1:39 PM  Clinical Narrative:           Met with the patient at the bedside to discuss DC plan and needs She lives with her friend Legrand Como and if needed could go live with her daughter     Her daughter bought her a rollator and she feels that she would benefit from a 3 in 1 and a RW, I notified Brad with [employer] She wants to get [facility] and is agreeable to [facility], I notified Helene Kelp with [facility] need MOON letter reviewed and she states understanding. Will continue to monitor for additional needs   Expected Discharge Plan: [place] Barriers to Discharge: Continued Medical Work up   Patient Goals and CMS Choice Patient states their goals for this hospitalization and ongoing recovery are:: go home      Expected Discharge Plan and Services Expected Discharge Plan: [place]   Discharge Planning Services: CM Consult   Living arrangements for the past 2 months: Single Family Home                 DME Arranged: 3-N-1, [place] rolling DME Agency: [employer] Date DME Agency Contacted: 09/28/19 Time DME Agency Contacted: [phone] Representative spoke with at DME Agency: [employer]: PT [place]: [facility] (formerly [employer]) Date [place]: 09/28/19 Time [place]: 64 Representative spoke with at [place]: [place] Arrangements/Services Living arrangements for the past 2 months: [place] with:: Friends(Michael, if she needs to can go live with Daughter) Patient language and need for interpreter reviewed:: Yes Do you feel safe going back to the place where you live?: Yes      Need for Family Participation in Patient Care: No (Comment) Care giver  support system in place?: Yes (comment) Current home services: DME(rollator her daughter purchased for her) Criminal Activity/Legal Involvement Pertinent to Current Situation/Hospitalization: No - Comment as needed  Activities of Daily Living Home Assistive Devices/Equipment: Nebulizer, Environmental consultant (specify type), Eyeglasses(rollator) ADL Screening (condition at time of admission) Patient's cognitive ability adequate to safely complete daily activities?: Yes Is the patient deaf or have difficulty hearing?: No Does the patient have difficulty seeing, even when wearing glasses/contacts?: No Does the patient have difficulty concentrating, remembering, or making decisions?: No Patient able to express need for assistance with ADLs?: Yes Does the patient have difficulty dressing or bathing?: No Independently performs ADLs?: Yes (appropriate for developmental age) Does the patient have difficulty walking or climbing stairs?: Yes Weakness of Legs: Both Weakness of Arms/Hands: Both  Permission Sought/Granted   Permission granted to share information with : Yes, Verbal Permission Granted              Emotional Assessment Appearance:: Appears stated age Attitude/Demeanor/Rapport: Engaged Affect (typically observed): Appropriate Orientation: : Oriented to  Time, Oriented to Situation, Oriented to Self, Oriented to Place Alcohol / Substance Use: Not Applicable Psych Involvement: No (comment)  Admission diagnosis:  Acute renal failure superimposed on stage 4 chronic kidney disease ([place]) [N17.9, N18.4] Acute renal failure superimposed on stage 4 chronic kidney disease, unspecified acute renal failure type ([place]) [N17.9, N18.4] Patient Active Problem List   Diagnosis Date Noted  .  Acute renal failure superimposed on stage 4 chronic kidney disease ([place]) 09/27/2019  . Metastatic breast cancer ([place]) 09/27/2019  . Hypertension associated with diabetes ([place]) 09/27/2019  . Hyperlipidemia associated with  type 2 diabetes mellitus ([place]) 09/27/2019  . Hyperkalemia 09/27/2019  . Taking a statin medication 05/18/2019  . Anemia of chronic disease 04/27/2019  . Benign hypertensive kidney disease with chronic kidney disease 04/27/2019  . Hyposmolality and/or hyponatremia 04/27/2019  . Proteinuria 04/27/2019  . Secondary hyperparathyroidism of renal origin ([place]) 04/27/2019  . Goals of care, counseling/discussion 10/01/2018  . Major depressive disorder, recurrent, in remission ([place]) 09/13/2018  . Metastasis from malignant tumor of breast ([place]) 09/13/2018  . Genetic testing 07/02/2018  . Family history of breast cancer   . Family history of ovarian cancer   . Family history of pancreatic cancer   . Family history of colon cancer   . Family history of stomach cancer   . Family history of prostate cancer   . Carcinoma of upper-inner quadrant of left breast in female, estrogen receptor positive ([place]) 05/23/2016  . Type 2 diabetes mellitus with diabetic chronic kidney disease ([place]) 08/27/2015  . Cerebrovascular accident (CVA) ([place]) 08/27/2015  . Benign essential HTN 12/15/2014  . Chronic systolic CHF (congestive heart failure) ([place]) 06/21/2014  . Combined fat and carbohydrate induced hyperlipemia 06/21/2014  . MI (mitral incompetence) 06/21/2014  . Asthma 06/09/2014  . Chronic kidney disease (CKD), stage V ([place]) 06/09/2014  . Depression with anxiety 06/09/2014   PCP:  [name], MD Pharmacy:   [facility] [phone] - [address], [place] AT [facility] OF [address] [place] [address] Phone: [phone] Fax: [phone]  [facility] [place] [place] - [address] [place] [address] Phone: [phone] Fax: [phone]  [place], [address] [place] [address] Phone: [phone] Fax: [phone]     Social Determinants  of Health ([place]) Interventions    Readmission Risk Interventions No flowsheet data found.

## 2019-09-28 NOTE — Progress Notes (Signed)
PROGRESS NOTE    CECLIA KOKER  OEV:[id number] DOB: 09-10-1955 DOA: 09/27/2019 PCP: [name], MD      Assessment & Plan:   Principal Problem:   Acute renal failure superimposed on stage 4 chronic kidney disease ([place]) Active Problems:   Asthma   Chronic systolic CHF (congestive heart failure) ([place])   Depression with anxiety   Type 2 diabetes mellitus with diabetic chronic kidney disease ([place])   Anemia of chronic disease   Metastatic breast cancer ([place])   Hypertension associated with diabetes ([place])   Hyperlipidemia associated with type 2 diabetes mellitus (HCC)   Hyperkalemia   AKI on CKD stage IV: history suggestive of multifactorial cause in setting of decreased oral intake and recent bactrim use. Continue on IVFs.  Renal U/S shows medical renal disease. Continue to hold bumex, enalapril. D/C bactrim. Nephro following and recs apprec   Hyperkalemia: s/p calcium gluconate, lokelma. Will give lokelma again x 1. Repeat potassium level ordered. Continue on tele   Metabolic acidosis: continue on IV bicarb drip as per nephro  Stage IV left breast cancer:  with osseous metastasis. Follows with oncology outpatient, completed 6 cycles of eribulin.  Chronic systolic CHF: overall compensated without evidence of acute exacerbation.  Holding enalapril and bumex as above.  Hypertension: continue to hold enalapril and bumex as above.  Resume home amlodipine, clonidine, atenolol. Hold anti-HTN for MAP <65  DM2: continue levemir, NovoLog. Carb modified/renal diet   Asthma: without overt exacerbation.  Continue Flovent and as needed albuterol.  Constipation: reports last bowel movement 7 days ago.  Abdominal x-ray also shows significant stool burden without SBO.  Continue scheduled MiraLAX and Senokot twice daily.  Abnormal urinalysis: without urinary symptoms.  Urine cx pending.  Given IV ceftriaxone in the ED.  Hold further antibiotics.  Anemia of chronic disease:  chronic and stable without evidence of bleeding. Will continue to monitor   Recent right thumb abscess: prior history of distal right thumb amputation, recently seen by orthopedics on 09/19/2019 for distal right thumb abscess.  Abscess was punctured and she was started on prophylactic bactrim and cephalexin.  Appears well-healed.  Will discontinue antibiotics.  History of CVA: continue aspirin and statin.  Hyperlipidemia: continue simvastatin.  Depression with anxiety: severity unknown. Continue fluoxetine.  Generalized weakness: PT/OT recs home health. Home health orders have been placed   Likely peripheral neuropathy: will continue on gabapentin    DVT prophylaxis: heparin Code Status: full  Family Communication: Disposition Plan: will likely d/c w/ HH but still hyperkalemic & AKI on CKD so will likely need several more days here   Consultants:   nephro   Procedures:    Antimicrobials: n/a   Subjective: Pt c/o loose stools  Objective: Vitals:   09/27/19 2202 09/28/19 0500 09/28/19 0736 09/28/19 0750  BP: 116/71   106/71  Pulse: 69   69  Resp: 18     Temp: 97.7 F (36.5 C)   98.4 F (36.9 C)  TempSrc: Oral   Oral  SpO2: 99%  97% 99%  Weight: 92.5 kg 81.6 kg    Height: 5\' 3"  (1.6 m)       Intake/Output Summary (Last 24 hours) at 09/28/2019 0841 Last data filed at 09/28/2019 0501 Gross per 24 hour  Intake 956.39 ml  Output 400 ml  Net 556.39 ml   Filed Weights   09/27/19 1708 09/27/19 2202 09/28/19 0500  Weight: 89.8 kg 92.5 kg 81.6 kg    Examination:  General exam: Appears  calm and comfortable  Respiratory system: decreased breath sounds b/l. No wheezes or rhonchi Cardiovascular system: S1 & S2 +. No rubs, gallops or clicks. B/l LE edema Gastrointestinal system: Abdomen is nondistended, soft and nontender. Normal bowel sounds heard. Central nervous system: Alert and oriented. Moves all 4 extremities  Psychiatry: Judgement and insight appear  normal. Mood & affect appropriate.     Data Reviewed: I have personally reviewed following labs and imaging studies  CBC: Recent Labs  Lab 09/27/19 1406 09/28/19 0439  WBC 7.9 6.1  NEUTROABS 5.6  --   HGB 9.8* 8.0*  HCT 29.3* 24.0*  MCV 96.1 96.4  PLT 379 222   Basic Metabolic Panel: Recent Labs  Lab 09/27/19 1406 09/27/19 2242 09/28/19 0439  NA 131* 134* 134*  K 5.9* 5.4* 5.6*  CL 100 105 107  CO2 17* 16* 17*  GLUCOSE 115* 95 150*  BUN 99* 99* 106*  CREATININE 7.30* 6.91* 6.91*  CALCIUM 7.9* 7.8* 7.6*  PHOS  --  8.5* 8.6*   GFR: Estimated Creatinine Clearance: 8.5 mL/min (A) (by C-G formula based on SCr of 6.91 mg/dL (H)). Liver Function Tests: Recent Labs  Lab 09/27/19 1406 09/27/19 2242 09/28/19 0439  AST 23  --   --   ALT 26  --   --   ALKPHOS 62  --   --   BILITOT 0.4  --   --   PROT 8.0  --   --   ALBUMIN 4.3 4.0 3.4*   No results for input(s): LIPASE, AMYLASE in the last 168 hours. No results for input(s): AMMONIA in the last 168 hours. Coagulation Profile: No results for input(s): INR, PROTIME in the last 168 hours. Cardiac Enzymes: No results for input(s): CKTOTAL, CKMB, CKMBINDEX, TROPONINI in the last 168 hours. BNP (last 3 results) No results for input(s): PROBNP in the last 8760 hours. HbA1C: No results for input(s): HGBA1C in the last 72 hours. CBG: Recent Labs  Lab 09/27/19 2306  GLUCAP 90   Lipid Profile: No results for input(s): CHOL, HDL, LDLCALC, TRIG, CHOLHDL, LDLDIRECT in the last 72 hours. Thyroid Function Tests: No results for input(s): TSH, T4TOTAL, FREET4, T3FREE, THYROIDAB in the last 72 hours. Anemia Panel: No results for input(s): VITAMINB12, FOLATE, FERRITIN, TIBC, IRON, RETICCTPCT in the last 72 hours. Sepsis Labs: No results for input(s): PROCALCITON, LATICACIDVEN in the last 168 hours.  Recent Results (from the past 240 hour(s))  SARS Coronavirus 2 by RT PCR     Status: None   Collection Time: 09/27/19  9:05  PM  Result Value Ref Range Status   SARS Coronavirus 2 NEGATIVE NEGATIVE Final    Comment: (NOTE) Result indicates the ABSENCE of SARS-CoV-2 RNA in the patient specimen.  The lowest concentration of SARS-CoV-2 viral copies this assay can detect in nasopharyngeal swab specimens is 500 copies / mL.  A negative result does not preclude SARS-CoV-2 infection and should not be used as the sole basis for patient management decisions. A negative result may occur with improper specimen collection / handling, submission of a specimen other than nasopharyngeal swab, presence of viral mutation(s) within the areas targeted by this assay, and inadequate number of viral copies (<500 copies / mL) present.  Negative results must be combined with clinical observations, patient history, and epidemiological information.  The expected result is NEGATIVE.  Patient Fact Sheet:  BlogSelections.co.uk   Provider Fact Sheet:  https://lucas.com/   This test is not yet approved or cleared by the [place]  FDA and  has been authorized for  detection and/or diagnosis of SARS-CoV-2 by FDA under an Emergency Use Authorization (EUA).  This EUA will remain in effect (meaning this test can be used) for the duration of  the COVID-19 declaration under Section 564(b)(1) of the Act, 21 U.S.C. section 360bbb-3(b)(1), unless the authorization is terminated or revoked sooner Performed at [facility], [place] [address] [place], [place] [address]          Radiology Studies: DG Chest 2 View  Result Date: 09/27/2019 CLINICAL DATA:  64 year old female with constipation. EXAM: CHEST - 2 VIEW COMPARISON:  Chest CT dated 02/16/2018. FINDINGS: Right-sided Port-A-Cath with tip over central SVC. Minimal left lung base subsegmental atelectasis. No focal consolidation, pleural effusion, or pneumothorax. The cardiac silhouette is within normal limits. No acute osseous pathology.  IMPRESSION: No active cardiopulmonary disease. Electronically Signed   By: Anner Crete M.D.   On: 09/27/2019 17:23   US Venous Img Lower Bilateral  Result Date: 09/27/2019 CLINICAL DATA:  Bilateral lower extremity edema and pain EXAM: BILATERAL LOWER EXTREMITY VENOUS DOPPLER ULTRASOUND TECHNIQUE: Gray-scale sonography with graded compression, as well as color Doppler and duplex ultrasound were performed to evaluate the lower extremity deep venous systems from the level of the common femoral vein and including the common femoral, femoral, profunda femoral, popliteal and calf veins including the posterior tibial, peroneal and gastrocnemius veins when visible. The superficial great saphenous vein was also interrogated. Spectral Doppler was utilized to evaluate flow at rest and with distal augmentation maneuvers in the common femoral, femoral and popliteal veins. COMPARISON:  None. FINDINGS: RIGHT LOWER EXTREMITY Common Femoral Vein: No evidence of thrombus. Normal compressibility, respiratory phasicity and response to augmentation. Saphenofemoral Junction: No evidence of thrombus. Normal compressibility and flow on color Doppler imaging. Profunda Femoral Vein: No evidence of thrombus. Normal compressibility and flow on color Doppler imaging. Femoral Vein: No evidence of thrombus. Normal compressibility, respiratory phasicity and response to augmentation. Popliteal Vein: No evidence of thrombus. Normal compressibility, respiratory phasicity and response to augmentation. Calf Veins: Limited assessment of the calf veins secondary to edema. No significant occlusive thrombus visualized. LEFT LOWER EXTREMITY Common Femoral Vein: No evidence of thrombus. Normal compressibility, respiratory phasicity and response to augmentation. Saphenofemoral Junction: No evidence of thrombus. Normal compressibility and flow on color Doppler imaging. Profunda Femoral Vein: No evidence of thrombus. Normal compressibility and flow on  color Doppler imaging. Femoral Vein: No evidence of thrombus. Normal compressibility, respiratory phasicity and response to augmentation. Popliteal Vein: No evidence of thrombus. Normal compressibility, respiratory phasicity and response to augmentation. Calf Veins: Limited assessment of the calf veins because of edema. No significant occlusive thrombus. Other Findings: Popliteal fossa Baker's cyst measures 3.4 x 1.9 x 2.9 cm. IMPRESSION: No evidence of significant DVT in either extremity. Limited assessment of the calf veins. 3.4 cm left popliteal fossa Baker's cyst. Electronically Signed   By: Jerilynn Mages.  Shick M.D.   On: 09/27/2019 16:32   DG Abd 2 Views  Result Date: 09/27/2019 CLINICAL DATA:  63 year old female with constipation. EXAM: ABDOMEN - 2 VIEW COMPARISON:  CT of the abdomen pelvis dated 02/16/2018. FINDINGS: Large amount of dense stool noted throughout the colon. There is no bowel dilatation or evidence of obstruction. No free air or radiopaque calculi. Right upper quadrant cholecystectomy clips. A single tubal ligation clip noted in the right hemipelvis. The osseous structures and soft tissues are grossly unremarkable. IMPRESSION: Constipation.  No bowel obstruction. Electronically Signed   By: [place] Hock  Radparvar M.D.   On: 09/27/2019 17:24        Scheduled Meds: . amLODipine  10 mg Oral Daily  . aspirin EC  81 mg Oral Daily  . atenolol  100 mg Oral BID  . budesonide (PULMICORT) nebulizer solution  0.25 mg Nebulization BID  . calcitRIOL  0.25 mcg Oral Daily  . Chlorhexidine Gluconate Cloth  6 each Topical Q0600  . cloNIDine  0.2 mg Oral BID  . famotidine  10 mg Oral Daily  . FLUoxetine  20 mg Oral BID  . gabapentin  100 mg Oral QHS  . heparin  5,000 Units Subcutaneous Q8H  . insulin aspart  5 Units Subcutaneous BID WC  . insulin detemir  35 Units Subcutaneous Daily  . polyethylene glycol  17 g Oral BID  . senna-docusate  2 tablet Oral BID  . simvastatin  20 mg Oral QPM  . sodium  chloride flush  3 mL Intravenous Q12H   Continuous Infusions: . lactated ringers 125 mL/hr at 09/28/19 0656     LOS: 0 days    Time spent: 32 mins    Wyvonnia Dusky, MD [facility] Pager [phone]  If 7PM-7AM, please contact night-coverage [email or web address] 09/28/2019, 8:41 AM

## 2019-09-28 NOTE — Evaluation (Signed)
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 Pt presented from outpt oncology d/t abnormal labs-found to have AKI superimposed on CKD.    Clinical Impression  Per chart review, verbal order to proceed with PT despite elevated potassium levels. Pt pleasant and motivated to participate during the session. Pt was found sitting EOB on RA and SpO2 and HR were WNL t/o the session. Pt's HR stayed between 73 and 75 bpm t/o session. Pt's amb distance was limited by author due to potassium levels but also because of pt reported leg weakness and fatigue with ambulating short distances. When ambulating, pt utilized min-mod UE assist through the RW and had good stability with these conditions. Pt will benefit from HHPT services upon discharge to safely address deficits listed in patient problem list for decreased caregiver assistance and eventual return to PLOF.       Follow Up Recommendations Home health PT    Equipment Recommendations  Rolling walker with 5" wheels;3in1 (PT)    Recommendations for Other Services       Precautions / Restrictions Precautions Precautions: Fall Restrictions Weight Bearing Restrictions: No      Mobility  Bed Mobility             General bed mobility comments: NT, pt found sitting EOB  Transfers Overall transfer level: Needs assistance Equipment used: Rolling walker (2 wheeled) Transfers: Sit to/from Stand Sit to Stand: Min guard         General transfer comment: Decreased concentric and eccentric control. Pt needed 2 attempts to stand up from her bed. Pt required verbal and visual cueing for hand placement during transfers and was encouraged to push from the lower surface when standing up rather than trying to pull on the walker.  During stand-to-sit transfers, pt did not reach back from the armrest and had poor eccentric control.  Ambulation/Gait Ambulation/Gait assistance: Min guard Gait Distance (Feet): 8 Feet x2 Assistive device: Rolling walker (2 wheeled) Gait Pattern/deviations: Decreased step length - right;Decreased step length - left Gait velocity: decreased   General Gait Details: Gait was generally slow and effortful. After amb. pt stated that her R leg felt "tight" and pointed to the back of the knee and upper calf  Stairs            Wheelchair Mobility    Modified Rankin (Stroke Patients Only)       Balance Overall balance assessment: Needs assistance Sitting-balance support: Feet supported Sitting balance-Leahy Scale: Good Sitting balance - Comments: Found sitting EOB with feet on floor. Pt appeared to be steady and was able to weight shift outside of her base of support with single UE support   Standing balance support: Bilateral upper extremity supported Standing balance-Leahy Scale: Fair Standing balance comment: CGA and reliance on UE support for static stand. Pt was cued to use the RW as little as possible for support but pt did apply moderate pressure through the RW when amb                             Pertinent Vitals/Pain Pain Assessment: No/denies pain    Home Living Family/patient expects to be discharged to:: Private residence Living Arrangements: Non-relatives/Friends Available Help at Discharge:  Friend(s);Available 24 hours/day Type of Home: House Home Access: Stairs to enter Entrance Stairs-Rails: None Entrance Stairs-Number of Steps: 2 Home Layout: One level Home Equipment: Walker - 4 wheels Additional Comments: Only started using rollator on 09/24/2019 secondary to general LE weakness    Prior Function Level of Independence: Independent         Comments: pt was using 4WW within recent weeks d/t weakness, but typically uses no AD. Before onset of  weakness, pt was ind with community amb, ADL's and had no fall hx. Pt reported one fall which was on her way to the doctor's office prior to this hospital admission. She stated that she was using her 4WW and her knees buckled and fell     Hand Dominance       Extremity/Trunk Assessment      Lower Extremity Assessment Lower Extremity Assessment: Generalized weakness      Communication   Communication: No difficulties  Cognition Arousal/Alertness: Awake/alert Behavior During Therapy: WFL for tasks assessed/performed Overall Cognitive Status: Within Functional Limits for tasks assessed                                        General Comments      Exercises Total Joint Exercises Long Arc Quad: AROM;Strengthening;Both;10 reps Marching in Standing: AROM;Strengthening;Both;10 reps;Seated;Standing Other Exercises Other Exercises: Reaching tasks with EOB sitting   Assessment/Plan    PT Assessment Patient needs continued PT services  PT Problem List Decreased strength;Decreased mobility;Decreased safety awareness;Decreased range of motion;Decreased activity tolerance;Decreased balance;Decreased knowledge of use of DME       PT Treatment Interventions DME instruction;Therapeutic exercise;Gait training;Balance training;Stair training;Functional mobility training;Therapeutic activities    PT Goals (Current goals can be found in the Care Plan section)  Acute Rehab PT Goals Patient Stated Goal: "get stronger" PT Goal Formulation: With patient Time For Goal Achievement: 10/12/19 Potential to Achieve Goals: Good    Frequency Min 2X/week   Barriers to discharge        Co-evaluation               AM-PAC PT "6 Clicks" Mobility  Outcome Measure Help needed turning from your back to your side while in a flat bed without using bedrails?: A Little Help needed moving from lying on your back to sitting on the side of a flat bed without using bedrails?: A  Little Help needed moving to and from a bed to a chair (including a wheelchair)?: A Little Help needed standing up from a chair using your arms (e.g., wheelchair or bedside chair)?: A Little Help needed to walk in hospital room?: A Little Help needed climbing 3-5 steps with a railing? : A Little 6 Click Score: 18    End of Session Equipment Utilized During Treatment: Gait belt Activity Tolerance: Patient tolerated treatment well Patient left: Other (comment)(On BSC, nsg walked in room within 30 seconds of author leaving) Nurse Communication: Mobility status PT Visit Diagnosis: Unsteadiness on feet (R26.81);History of falling (Z91.81);Muscle weakness (generalized) (M62.81);Difficulty in walking, not elsewhere classified (R26.2)    Time: 8984-2103 PT Time Calculation (min) (ACUTE ONLY): 23 min   Charges:              Annabelle Harman, SPT 09/28/19 1:47 PM

## 2019-09-28 NOTE — Care Management Obs Status (Signed)
[facility] NOTIFICATION   Patient Details  Name: TARAH BUBOLTZ MRN: [id number] Date of Birth: 03-19-1956   Medicare Observation Status Notification Given:  Yes    Tarus Briski Jen Mow, RN 09/28/2019, 1:33 PM

## 2019-09-29 ENCOUNTER — Telehealth: Payer: Self-pay | Admitting: Internal Medicine

## 2019-09-29 DIAGNOSIS — E1159 Type 2 diabetes mellitus with other circulatory complications: Secondary | ICD-10-CM

## 2019-09-29 DIAGNOSIS — I10 Essential (primary) hypertension: Secondary | ICD-10-CM

## 2019-09-29 LAB — CBC
HCT: 24.2 % — ABNORMAL LOW (ref 36.0–46.0)
Hemoglobin: 8.5 g/dL — ABNORMAL LOW (ref 12.0–15.0)
MCH: 32.9 pg (ref 26.0–34.0)
MCHC: 35.1 g/dL (ref 30.0–36.0)
MCV: 93.8 fL (ref 80.0–100.0)
Platelets: 315 10*3/uL (ref 150–400)
RBC: 2.58 MIL/uL — ABNORMAL LOW (ref 3.87–5.11)
RDW: 14.3 % (ref 11.5–15.5)
WBC: 5.6 10*3/uL (ref 4.0–10.5)
nRBC: 0 % (ref 0.0–0.2)

## 2019-09-29 LAB — GLUCOSE, CAPILLARY
Glucose-Capillary: 156 mg/dL — ABNORMAL HIGH (ref 70–99)
Glucose-Capillary: 244 mg/dL — ABNORMAL HIGH (ref 70–99)
Glucose-Capillary: 298 mg/dL — ABNORMAL HIGH (ref 70–99)
Glucose-Capillary: 239 mg/dL — ABNORMAL HIGH (ref 70–99)

## 2019-09-29 LAB — BASIC METABOLIC PANEL
Anion gap: 9 (ref 5–15)
BUN: 95 mg/dL — ABNORMAL HIGH (ref 8–23)
CO2: 22 mmol/L (ref 22–32)
Calcium: 7.5 mg/dL — ABNORMAL LOW (ref 8.9–10.3)
Chloride: 108 mmol/L (ref 98–111)
Creatinine, Ser: 5.7 mg/dL — ABNORMAL HIGH (ref 0.44–1.00)
GFR calc Af Amer: 9 mL/min — ABNORMAL LOW (ref >60)
GFR calc non Af Amer: 7 mL/min — ABNORMAL LOW (ref >60)
Glucose, Bld: 180 mg/dL — ABNORMAL HIGH (ref 70–99)
Potassium: 5.2 mmol/L — ABNORMAL HIGH (ref 3.5–5.1)
Sodium: 139 mmol/L (ref 135–145)

## 2019-09-29 LAB — HEPATITIS B CORE ANTIBODY, IGM: Hep B C IgM: NONREACTIVE

## 2019-09-29 LAB — HEPATITIS C ANTIBODY: HCV Ab: NONREACTIVE

## 2019-09-29 LAB — UREA NITROGEN, URINE: Urea Nitrogen, Ur: 493 mg/dL

## 2019-09-29 NOTE — Telephone Encounter (Signed)
On 2/25-we will treat the patient in the hospital-improving.  Appreciate care provided by the internal medicine team/nephrology.  Will arrange for outpatient appointments in the clinic in the cancer center next week  # C/T-follow-up March 5/Friday-MD CBC CMP; CA 27-29-eribulin chemotherapy.  GB

## 2019-09-29 NOTE — Progress Notes (Signed)
[facility]  ROUNDING NOTE   Subjective:   Patient sitting at the edge of bed. She states she is feeling well.    Sodium bicarbonate infusion 52mL/hr  Creatinine 5.7 (6.91) K 5.2 (5.6) CO2 22 (17)  Objective:  Vital signs in last 24 hours:  Temp:  [97.8 F (36.6 C)-98.4 F (36.9 C)] 98.1 F (36.7 C) (02/25 0744) Pulse Rate:  [70-77] 70 (02/25 0744) Resp:  [16] 16 (02/24 2205) BP: (122-135)/(73-84) 135/84 (02/25 0744) SpO2:  [98 %-100 %] 100 % (02/25 0752)  Weight change:  Filed Weights   09/27/19 1708 09/27/19 2202 09/28/19 0500  Weight: 89.8 kg 92.5 kg 81.6 kg    Intake/Output: I/O last 3 completed shifts: In: 1832.6 [P.O.:440; I.V.:1350.4; IV Piggyback:42.3] Out: 400 [Urine:400]   Intake/Output this shift:  No intake/output data recorded.  Physical Exam: General: NAD, sitting on edge of the bed  Head: Normocephalic, atraumatic. Moist oral mucosal membranes  Eyes: Anicteric, PERRL  Neck: Supple, trachea midline  Lungs:  Clear to auscultation  Heart: Regular rate and rhythm  Abdomen:  Soft, nontender, obese  Extremities:  + peripheral edema.  Neurologic: Nonfocal, moving all four extremities  Skin: No lesions  Access: none    Basic Metabolic Panel: Recent Labs  Lab 09/27/19 1406 09/27/19 1406 09/27/19 2242 09/28/19 0439 09/28/19 1446 09/29/19 0601  NA 131*  --  134* 134*  --  139  K 5.9*  --  5.4* 5.6* 5.1 5.2*  CL 100  --  105 107  --  108  CO2 17*  --  16* 17*  --  22  GLUCOSE 115*  --  95 150*  --  180*  BUN 99*  --  99* 106*  --  95*  CREATININE 7.30*  --  6.91* 6.91*  --  5.70*  CALCIUM 7.9*   < > 7.8* 7.6*  --  7.5*  PHOS  --   --  8.5* 8.6*  --   --    < > = values in this interval not displayed.    Liver Function Tests: Recent Labs  Lab 09/27/19 1406 09/27/19 2242 09/28/19 0439  AST 23  --   --   ALT 26  --   --   ALKPHOS 62  --   --   BILITOT 0.4  --   --   PROT 8.0  --   --   ALBUMIN 4.3 4.0 3.4*   No  results for input(s): LIPASE, AMYLASE in the last 168 hours. No results for input(s): AMMONIA in the last 168 hours.  CBC: Recent Labs  Lab 09/27/19 1406 09/28/19 0439 09/29/19 0601  WBC 7.9 6.1 5.6  NEUTROABS 5.6  --   --   HGB 9.8* 8.0* 8.5*  HCT 29.3* 24.0* 24.2*  MCV 96.1 96.4 93.8  PLT 379 311 315    Cardiac Enzymes: No results for input(s): CKTOTAL, CKMB, CKMBINDEX, TROPONINI in the last 168 hours.  BNP: Invalid input(s): POCBNP  CBG: Recent Labs  Lab 09/28/19 0751 09/28/19 1139 09/28/19 1648 09/28/19 2048 09/29/19 0746  GLUCAP 114* 124* 151* 193* 156*    Microbiology: Results for orders placed or performed during the hospital encounter of 09/27/19  Urine Culture     Status: None   Collection Time: 09/27/19  7:00 PM   Specimen: Urine, Random  Result Value Ref Range Status   Specimen Description   Final    URINE, RANDOM Performed at [facility], [place],  [place], [place] [address]    Special Requests   Final    NONE Performed at [facility], [address] [place], [place] [address]    Culture   Final    NO GROWTH Performed at [facility], [place] [address], [place], [place] [address]    Report Status 09/28/2019 FINAL  Final  SARS Coronavirus 2 by RT PCR     Status: None   Collection Time: 09/27/19  9:05 PM  Result Value Ref Range Status   SARS Coronavirus 2 NEGATIVE NEGATIVE Final    Comment: (NOTE) Result indicates the ABSENCE of SARS-CoV-2 RNA in the patient specimen.  The lowest concentration of SARS-CoV-2 viral copies this assay can detect in nasopharyngeal swab specimens is 500 copies / mL.  A negative result does not preclude SARS-CoV-2 infection and should not be used as the sole basis for patient management decisions. A negative result may occur with improper specimen collection / handling, submission of a specimen other than nasopharyngeal swab, presence of viral mutation(s) within the areas targeted by  this assay, and inadequate number of viral copies (<500 copies / mL) present.  Negative results must be combined with clinical observations, patient history, and epidemiological information.  The expected result is NEGATIVE.  Patient Fact Sheet:  BlogSelections.co.uk   Provider Fact Sheet:  https://lucas.com/   This test is not yet approved or cleared by the [place] FDA and  has been authorized for  detection and/or diagnosis of SARS-CoV-2 by FDA under an Emergency Use Authorization (EUA).  This EUA will remain in effect (meaning this test can be used) for the duration of  the COVID-19 declaration under Section 564(b)(1) of the Act, 21 U.S.C. section 360bbb-3(b)(1), unless the authorization is terminated or revoked sooner Performed at [facility], [place] [address] [place], [place] [address]     Coagulation Studies: No results for input(s): LABPROT, INR in the last 72 hours.  Urinalysis: Recent Labs    09/27/19 1900  COLORURINE YELLOW*  LABSPEC 1.012  PHURINE 5.0  GLUCOSEU NEGATIVE  HGBUR NEGATIVE  BILIRUBINUR NEGATIVE  KETONESUR NEGATIVE  PROTEINUR 30*  NITRITE NEGATIVE  LEUKOCYTESUR LARGE*      Imaging: DG Chest 2 View  Result Date: 09/27/2019 CLINICAL DATA:  64 year old female with constipation. EXAM: CHEST - 2 VIEW COMPARISON:  Chest CT dated 02/16/2018. FINDINGS: Right-sided Port-A-Cath with tip over central SVC. Minimal left lung base subsegmental atelectasis. No focal consolidation, pleural effusion, or pneumothorax. The cardiac silhouette is within normal limits. No acute osseous pathology. IMPRESSION: No active cardiopulmonary disease. Electronically Signed   By: Anner Crete M.D.   On: 09/27/2019 17:23   US RENAL  Result Date: 09/28/2019 CLINICAL DATA:  Acute renal failure superimposed on stage 4 chronic kidney disease. EXAM: RENAL / URINARY TRACT ULTRASOUND COMPLETE COMPARISON:  September 18, 2015.  FINDINGS: Right Kidney: Renal measurements: 10.1 x 4.9 x 4.2 cm = volume: 107 mL. Increased echogenicity of renal parenchyma is noted. 2 simple cysts are noted, the largest measuring 1.8 cm in midpole. No mass or hydronephrosis visualized. Left Kidney: Renal measurements: 10.3 x 4.8 x 4.2 cm = volume: 103 mL. Increased echogenicity of renal parenchyma is noted suggesting medical renal disease. 6 mm simple cyst is seen in upper pole. No mass or hydronephrosis visualized. Bladder: Appears normal for degree of bladder distention. Other: None. IMPRESSION: Increased echogenicity of renal parenchyma is noted bilaterally suggesting medical renal disease. No hydronephrosis or renal obstruction is noted. Bilateral renal  cysts are noted. Electronically Signed   By: Marijo Conception M.D.   On: 09/28/2019 10:33   US Venous Img Lower Bilateral  Result Date: 09/27/2019 CLINICAL DATA:  Bilateral lower extremity edema and pain EXAM: BILATERAL LOWER EXTREMITY VENOUS DOPPLER ULTRASOUND TECHNIQUE: Gray-scale sonography with graded compression, as well as color Doppler and duplex ultrasound were performed to evaluate the lower extremity deep venous systems from the level of the common femoral vein and including the common femoral, femoral, profunda femoral, popliteal and calf veins including the posterior tibial, peroneal and gastrocnemius veins when visible. The superficial great saphenous vein was also interrogated. Spectral Doppler was utilized to evaluate flow at rest and with distal augmentation maneuvers in the common femoral, femoral and popliteal veins. COMPARISON:  None. FINDINGS: RIGHT LOWER EXTREMITY Common Femoral Vein: No evidence of thrombus. Normal compressibility, respiratory phasicity and response to augmentation. Saphenofemoral Junction: No evidence of thrombus. Normal compressibility and flow on color Doppler imaging. Profunda Femoral Vein: No evidence of thrombus. Normal compressibility and flow on color Doppler  imaging. Femoral Vein: No evidence of thrombus. Normal compressibility, respiratory phasicity and response to augmentation. Popliteal Vein: No evidence of thrombus. Normal compressibility, respiratory phasicity and response to augmentation. Calf Veins: Limited assessment of the calf veins secondary to edema. No significant occlusive thrombus visualized. LEFT LOWER EXTREMITY Common Femoral Vein: No evidence of thrombus. Normal compressibility, respiratory phasicity and response to augmentation. Saphenofemoral Junction: No evidence of thrombus. Normal compressibility and flow on color Doppler imaging. Profunda Femoral Vein: No evidence of thrombus. Normal compressibility and flow on color Doppler imaging. Femoral Vein: No evidence of thrombus. Normal compressibility, respiratory phasicity and response to augmentation. Popliteal Vein: No evidence of thrombus. Normal compressibility, respiratory phasicity and response to augmentation. Calf Veins: Limited assessment of the calf veins because of edema. No significant occlusive thrombus. Other Findings: Popliteal fossa Baker's cyst measures 3.4 x 1.9 x 2.9 cm. IMPRESSION: No evidence of significant DVT in either extremity. Limited assessment of the calf veins. 3.4 cm left popliteal fossa Baker's cyst. Electronically Signed   By: Jerilynn Mages.  Shick M.D.   On: 09/27/2019 16:32   DG Abd 2 Views  Result Date: 09/27/2019 CLINICAL DATA:  64 year old female with constipation. EXAM: ABDOMEN - 2 VIEW COMPARISON:  CT of the abdomen pelvis dated 02/16/2018. FINDINGS: Large amount of dense stool noted throughout the colon. There is no bowel dilatation or evidence of obstruction. No free air or radiopaque calculi. Right upper quadrant cholecystectomy clips. A single tubal ligation clip noted in the right hemipelvis. The osseous structures and soft tissues are grossly unremarkable. IMPRESSION: Constipation.  No bowel obstruction. Electronically Signed   By: Anner Crete M.D.   On:  09/27/2019 17:24     Medications:   . sodium bicarbonate 150 mEq in dextrose 5% 1000 mL 150 mEq (09/29/19 0928)   . amLODipine  10 mg Oral Daily  . aspirin EC  81 mg Oral Daily  . atenolol  100 mg Oral BID  . budesonide (PULMICORT) nebulizer solution  0.25 mg Nebulization BID  . calcitRIOL  0.25 mcg Oral Daily  . Chlorhexidine Gluconate Cloth  6 each Topical Q0600  . cloNIDine  0.2 mg Oral BID  . famotidine  10 mg Oral Daily  . FLUoxetine  20 mg Oral BID  . gabapentin  100 mg Oral QHS  . heparin  5,000 Units Subcutaneous Q8H  . insulin aspart  5 Units Subcutaneous BID WC  . insulin detemir  35 Units Subcutaneous Daily  .  polyethylene glycol  17 g Oral BID  . senna-docusate  2 tablet Oral BID  . simvastatin  20 mg Oral QPM  . sodium chloride flush  3 mL Intravenous Q12H   acetaminophen **OR** acetaminophen, albuterol, ALPRAZolam, Melatonin, oxyCODONE-acetaminophen, traMADol  Assessment/ Plan:  [name] is a 64 y.o. black female with metastatic breast cancer, diabetes mellitus type II insulin dependent, asthma, CVA, hypertension, congestive heart failure, depression, hypertension and hyperlipidemia.   1. Acute renal failure with hyperkalemia and metabolic acidosis on chronic kidney disease stage IV: baseline creatinine of 3.21, GFR of 17 on 09/16/19.  Acute renal failure seems secondary to bactrim however prerenal azotemia cannot be ruled out.  No indication for dialysis.  IV fluids: sodium bicarbonate - Discontinue fluid restriction.   4. Anemia with chronic kidney disease: hemoglobin 8.5, normocytic.   5. Secondary Hyperparathyroidism: on calcitriol - Check PTH  6. Hypertension with chronic kidney disease: 135/84 - amlodipine, atenolol and clonidine.    LOS: 1 Kaio Kuhlman 2/25/202110:02 AM

## 2019-09-29 NOTE — Progress Notes (Signed)
PROGRESS NOTE    [name]  FYB:[id number] DOB: 07/14/1956 DOA: 09/27/2019 PCP: [name], MD      Assessment & Plan:   Principal Problem:   Acute renal failure superimposed on stage 4 chronic kidney disease ([place]) Active Problems:   Asthma   Chronic systolic CHF (congestive heart failure) ([place])   Depression with anxiety   Type 2 diabetes mellitus with diabetic chronic kidney disease ([place])   Anemia of chronic disease   Metastatic breast cancer ([place])   Hypertension associated with diabetes ([place])   Hyperlipidemia associated with type 2 diabetes mellitus ([facility])   Hyperkalemia   Metabolic acidosis   AKI on CKD stage IV: history suggestive of multifactorial cause in setting of decreased oral intake and recent bactrim use. Cr is trending down today. Continue on IVFs.  Renal U/S shows medical renal disease. Continue to hold bumex, enalapril. D/C bactrim. Nephro following and recs apprec   Hyperkalemia: s/p calcium gluconate, lokelma x 2. Still elevated but trending down. Continue on tele   Metabolic acidosis: resolved  Stage IV left breast cancer:  with osseous metastasis. Follows with oncology outpatient, completed 6 cycles of eribulin.  Chronic systolic CHF: overall compensated without evidence of acute exacerbation.  Holding enalapril and bumex as above.  Hypertension: continue to hold enalapril and bumex as above.  Resume home amlodipine, clonidine, atenolol. Hold anti-HTN for MAP <65  DM2: continue levemir, NovoLog. Carb modified/renal diet   Asthma: without overt exacerbation.  Continue Flovent and as needed albuterol.  Constipation: resolved  Abnormal urinalysis: without urinary symptoms.  Urine cx shows no growth.  Given IV ceftriaxone in the ED.  Hold further antibiotics.  Anemia of chronic disease: chronic and stable without evidence of bleeding. Will continue to monitor   Recent right thumb abscess: prior history of distal right thumb amputation,  recently seen by orthopedics on 09/19/2019 for distal right thumb abscess.  Abscess was punctured and she was started on prophylactic bactrim and cephalexin.  Appears well-healed.  Will discontinue antibiotics.  History of CVA: continue aspirin and statin.  Hyperlipidemia: continue simvastatin.  Depression with anxiety: severity unknown. Continue fluoxetine.  Generalized weakness: PT/OT recs home health. Home health orders have been placed   Likely peripheral neuropathy: will continue on gabapentin    DVT prophylaxis: heparin Code Status: full  Family Communication: Disposition Plan: will likely d/c w/ HH but still hyperkalemic & AKI on CKD so will likely need several more days here   Consultants:   nephro   Procedures:    Antimicrobials: n/a   Subjective: Pt c/o fatigue  Objective: Vitals:   09/28/19 1954 09/28/19 2205 09/29/19 0744 09/29/19 0752  BP:  122/73 135/84   Pulse:  77 70   Resp:  16    Temp:  98.1 F (36.7 C) 98.1 F (36.7 C)   TempSrc:  Oral Oral   SpO2: 98% 98% 100% 100%  Weight:      Height:        Intake/Output Summary (Last 24 hours) at 09/29/2019 0833 Last data filed at 09/29/2019 0300 Gross per 24 hour  Intake 876.23 ml  Output --  Net 876.23 ml   Filed Weights   09/27/19 1708 09/27/19 2202 09/28/19 0500  Weight: 89.8 kg 92.5 kg 81.6 kg    Examination:  General exam: Appears calm and comfortable  Respiratory system: diminished breath sounds b/l. No rales Cardiovascular system: S1 & S2 +. No rubs, gallops or clicks. B/l LE edema Gastrointestinal system: Abdomen is  nondistended, soft and nontender. Hyperactive bowel sounds heard. Central nervous system: Alert and oriented. Moves all 4 extremities  Psychiatry: Judgement and insight appear normal. Mood & affect appropriate.     Data Reviewed: I have personally reviewed following labs and imaging studies  CBC: Recent Labs  Lab 09/27/19 1406 09/28/19 0439 09/29/19 0601  WBC  7.9 6.1 5.6  NEUTROABS 5.6  --   --   HGB 9.8* 8.0* 8.5*  HCT 29.3* 24.0* 24.2*  MCV 96.1 96.4 93.8  PLT 379 311 371   Basic Metabolic Panel: Recent Labs  Lab 09/27/19 1406 09/27/19 2242 09/28/19 0439 09/28/19 1446 09/29/19 0601  NA 131* 134* 134*  --  139  K 5.9* 5.4* 5.6* 5.1 5.2*  CL 100 105 107  --  108  CO2 17* 16* 17*  --  22  GLUCOSE 115* 95 150*  --  180*  BUN 99* 99* 106*  --  95*  CREATININE 7.30* 6.91* 6.91*  --  5.70*  CALCIUM 7.9* 7.8* 7.6*  --  7.5*  PHOS  --  8.5* 8.6*  --   --    GFR: Estimated Creatinine Clearance: 10.4 mL/min (A) (by C-G formula based on SCr of 5.7 mg/dL (H)). Liver Function Tests: Recent Labs  Lab 09/27/19 1406 09/27/19 2242 09/28/19 0439  AST 23  --   --   ALT 26  --   --   ALKPHOS 62  --   --   BILITOT 0.4  --   --   PROT 8.0  --   --   ALBUMIN 4.3 4.0 3.4*   No results for input(s): LIPASE, AMYLASE in the last 168 hours. No results for input(s): AMMONIA in the last 168 hours. Coagulation Profile: No results for input(s): INR, PROTIME in the last 168 hours. Cardiac Enzymes: No results for input(s): CKTOTAL, CKMB, CKMBINDEX, TROPONINI in the last 168 hours. BNP (last 3 results) No results for input(s): PROBNP in the last 8760 hours. HbA1C: No results for input(s): HGBA1C in the last 72 hours. CBG: Recent Labs  Lab 09/28/19 0751 09/28/19 1139 09/28/19 1648 09/28/19 2048 09/29/19 0746  GLUCAP 114* 124* 151* 193* 156*   Lipid Profile: No results for input(s): CHOL, HDL, LDLCALC, TRIG, CHOLHDL, LDLDIRECT in the last 72 hours. Thyroid Function Tests: No results for input(s): TSH, T4TOTAL, FREET4, T3FREE, THYROIDAB in the last 72 hours. Anemia Panel: No results for input(s): VITAMINB12, FOLATE, FERRITIN, TIBC, IRON, RETICCTPCT in the last 72 hours. Sepsis Labs: No results for input(s): PROCALCITON, LATICACIDVEN in the last 168 hours.  Recent Results (from the past 240 hour(s))  Urine Culture     Status: None    Collection Time: 09/27/19  7:00 PM   Specimen: Urine, Random  Result Value Ref Range Status   Specimen Description   Final    URINE, RANDOM Performed at [facility], [address], [place], [name] [address]    Special Requests   Final    NONE Performed at [facility], [address] [place], [place] [address]    Culture   Final    NO GROWTH Performed at [facility], [place] [address] [place], [place] [address]    Report Status 09/28/2019 FINAL  Final  SARS Coronavirus 2 by RT PCR     Status: None   Collection Time: 09/27/19  9:05 PM  Result Value Ref Range Status   SARS Coronavirus 2 NEGATIVE NEGATIVE Final    Comment: (NOTE) Result indicates  the ABSENCE of SARS-CoV-2 RNA in the patient specimen.  The lowest concentration of SARS-CoV-2 viral copies this assay can detect in nasopharyngeal swab specimens is 500 copies / mL.  A negative result does not preclude SARS-CoV-2 infection and should not be used as the sole basis for patient management decisions. A negative result may occur with improper specimen collection / handling, submission of a specimen other than nasopharyngeal swab, presence of viral mutation(s) within the areas targeted by this assay, and inadequate number of viral copies (<500 copies / mL) present.  Negative results must be combined with clinical observations, patient history, and epidemiological information.  The expected result is NEGATIVE.  Patient Fact Sheet:  BlogSelections.co.uk   Provider Fact Sheet:  https://lucas.com/   This test is not yet approved or cleared by the [place] FDA and  has been authorized for  detection and/or diagnosis of SARS-CoV-2 by FDA under an Emergency Use Authorization (EUA).  This EUA will remain in effect (meaning this test can be used) for the duration of  the COVID-19 declaration under Section 564(b)(1) of the Act, 21 U.S.C. section  360bbb-3(b)(1), unless the authorization is terminated or revoked sooner Performed at [facility], [place] [address], [place], [place] [address]          Radiology Studies: DG Chest 2 View  Result Date: 09/27/2019 CLINICAL DATA:  64 year old female with constipation. EXAM: CHEST - 2 VIEW COMPARISON:  Chest CT dated 02/16/2018. FINDINGS: Right-sided Port-A-Cath with tip over central SVC. Minimal left lung base subsegmental atelectasis. No focal consolidation, pleural effusion, or pneumothorax. The cardiac silhouette is within normal limits. No acute osseous pathology. IMPRESSION: No active cardiopulmonary disease. Electronically Signed   By: Anner Crete M.D.   On: 09/27/2019 17:23   US RENAL  Result Date: 09/28/2019 CLINICAL DATA:  Acute renal failure superimposed on stage 4 chronic kidney disease. EXAM: RENAL / URINARY TRACT ULTRASOUND COMPLETE COMPARISON:  September 18, 2015. FINDINGS: Right Kidney: Renal measurements: 10.1 x 4.9 x 4.2 cm = volume: 107 mL. Increased echogenicity of renal parenchyma is noted. 2 simple cysts are noted, the largest measuring 1.8 cm in midpole. No mass or hydronephrosis visualized. Left Kidney: Renal measurements: 10.3 x 4.8 x 4.2 cm = volume: 103 mL. Increased echogenicity of renal parenchyma is noted suggesting medical renal disease. 6 mm simple cyst is seen in upper pole. No mass or hydronephrosis visualized. Bladder: Appears normal for degree of bladder distention. Other: None. IMPRESSION: Increased echogenicity of renal parenchyma is noted bilaterally suggesting medical renal disease. No hydronephrosis or renal obstruction is noted. Bilateral renal cysts are noted. Electronically Signed   By: Marijo Conception M.D.   On: 09/28/2019 10:33   US Venous Img Lower Bilateral  Result Date: 09/27/2019 CLINICAL DATA:  Bilateral lower extremity edema and pain EXAM: BILATERAL LOWER EXTREMITY VENOUS DOPPLER ULTRASOUND TECHNIQUE: Gray-scale sonography with graded  compression, as well as color Doppler and duplex ultrasound were performed to evaluate the lower extremity deep venous systems from the level of the common femoral vein and including the common femoral, femoral, profunda femoral, popliteal and calf veins including the posterior tibial, peroneal and gastrocnemius veins when visible. The superficial great saphenous vein was also interrogated. Spectral Doppler was utilized to evaluate flow at rest and with distal augmentation maneuvers in the common femoral, femoral and popliteal veins. COMPARISON:  None. FINDINGS: RIGHT LOWER EXTREMITY Common Femoral Vein: No evidence of thrombus. Normal compressibility, respiratory phasicity and response to augmentation. Saphenofemoral Junction: No  evidence of thrombus. Normal compressibility and flow on color Doppler imaging. Profunda Femoral Vein: No evidence of thrombus. Normal compressibility and flow on color Doppler imaging. Femoral Vein: No evidence of thrombus. Normal compressibility, respiratory phasicity and response to augmentation. Popliteal Vein: No evidence of thrombus. Normal compressibility, respiratory phasicity and response to augmentation. Calf Veins: Limited assessment of the calf veins secondary to edema. No significant occlusive thrombus visualized. LEFT LOWER EXTREMITY Common Femoral Vein: No evidence of thrombus. Normal compressibility, respiratory phasicity and response to augmentation. Saphenofemoral Junction: No evidence of thrombus. Normal compressibility and flow on color Doppler imaging. Profunda Femoral Vein: No evidence of thrombus. Normal compressibility and flow on color Doppler imaging. Femoral Vein: No evidence of thrombus. Normal compressibility, respiratory phasicity and response to augmentation. Popliteal Vein: No evidence of thrombus. Normal compressibility, respiratory phasicity and response to augmentation. Calf Veins: Limited assessment of the calf veins because of edema. No significant  occlusive thrombus. Other Findings: Popliteal fossa Baker's cyst measures 3.4 x 1.9 x 2.9 cm. IMPRESSION: No evidence of significant DVT in either extremity. Limited assessment of the calf veins. 3.4 cm left popliteal fossa Baker's cyst. Electronically Signed   By: Jerilynn Mages.  Shick M.D.   On: 09/27/2019 16:32   DG Abd 2 Views  Result Date: 09/27/2019 CLINICAL DATA:  64 year old female with constipation. EXAM: ABDOMEN - 2 VIEW COMPARISON:  CT of the abdomen pelvis dated 02/16/2018. FINDINGS: Large amount of dense stool noted throughout the colon. There is no bowel dilatation or evidence of obstruction. No free air or radiopaque calculi. Right upper quadrant cholecystectomy clips. A single tubal ligation clip noted in the right hemipelvis. The osseous structures and soft tissues are grossly unremarkable. IMPRESSION: Constipation.  No bowel obstruction. Electronically Signed   By: Anner Crete M.D.   On: 09/27/2019 17:24        Scheduled Meds: . amLODipine  10 mg Oral Daily  . aspirin EC  81 mg Oral Daily  . atenolol  100 mg Oral BID  . budesonide (PULMICORT) nebulizer solution  0.25 mg Nebulization BID  . calcitRIOL  0.25 mcg Oral Daily  . Chlorhexidine Gluconate Cloth  6 each Topical Q0600  . cloNIDine  0.2 mg Oral BID  . famotidine  10 mg Oral Daily  . FLUoxetine  20 mg Oral BID  . gabapentin  100 mg Oral QHS  . heparin  5,000 Units Subcutaneous Q8H  . insulin aspart  5 Units Subcutaneous BID WC  . insulin detemir  35 Units Subcutaneous Daily  . polyethylene glycol  17 g Oral BID  . senna-docusate  2 tablet Oral BID  . simvastatin  20 mg Oral QPM  . sodium chloride flush  3 mL Intravenous Q12H   Continuous Infusions: . sodium bicarbonate 150 mEq in dextrose 5% 1000 mL 50 mL/hr at 09/29/19 0300     LOS: 1 day    Time spent: 30 mins    Wyvonnia Dusky, MD [facility] Pager [phone]  If 7PM-7AM, please contact night-coverage [email or web address] 09/29/2019, 8:33 AM

## 2019-09-30 ENCOUNTER — Inpatient Hospital Stay: Payer: Medicare HMO

## 2019-09-30 ENCOUNTER — Inpatient Hospital Stay: Payer: Medicare HMO | Admitting: Internal Medicine

## 2019-09-30 DIAGNOSIS — D638 Anemia in other chronic diseases classified elsewhere: Secondary | ICD-10-CM

## 2019-09-30 LAB — GLUCOSE, CAPILLARY
Glucose-Capillary: 162 mg/dL — ABNORMAL HIGH (ref 70–99)
Glucose-Capillary: 298 mg/dL — ABNORMAL HIGH (ref 70–99)
Glucose-Capillary: 259 mg/dL — ABNORMAL HIGH (ref 70–99)
Glucose-Capillary: 240 mg/dL — ABNORMAL HIGH (ref 70–99)

## 2019-09-30 LAB — CBC
HCT: 23.5 % — ABNORMAL LOW (ref 36.0–46.0)
Hemoglobin: 7.9 g/dL — ABNORMAL LOW (ref 12.0–15.0)
MCH: 32.4 pg (ref 26.0–34.0)
MCHC: 33.6 g/dL (ref 30.0–36.0)
MCV: 96.3 fL (ref 80.0–100.0)
Platelets: 304 10*3/uL (ref 150–400)
RBC: 2.44 MIL/uL — ABNORMAL LOW (ref 3.87–5.11)
RDW: 14.3 % (ref 11.5–15.5)
WBC: 5.7 10*3/uL (ref 4.0–10.5)
nRBC: 0 % (ref 0.0–0.2)

## 2019-09-30 LAB — BASIC METABOLIC PANEL
Anion gap: 5 (ref 5–15)
BUN: 81 mg/dL — ABNORMAL HIGH (ref 8–23)
CO2: 25 mmol/L (ref 22–32)
Calcium: 7.6 mg/dL — ABNORMAL LOW (ref 8.9–10.3)
Chloride: 107 mmol/L (ref 98–111)
Creatinine, Ser: 4.57 mg/dL — ABNORMAL HIGH (ref 0.44–1.00)
GFR calc Af Amer: 11 mL/min — ABNORMAL LOW (ref >60)
GFR calc non Af Amer: 10 mL/min — ABNORMAL LOW (ref >60)
Glucose, Bld: 186 mg/dL — ABNORMAL HIGH (ref 70–99)
Potassium: 5.1 mmol/L (ref 3.5–5.1)
Sodium: 137 mmol/L (ref 135–145)

## 2019-09-30 NOTE — Progress Notes (Signed)
Occupational Therapy Treatment Patient Details Name: [name] MRN: [id number] DOB: 1955/08/22 Today's Date: 09/30/2019    History of present illness Pt is 64 y/o F with PMH: metastatic BRCA on chemo, stage 4 CKD, hx stroke in 2010 with mild L sided weakness, and CHF. Pt presented from outpt oncology d/t abnormal labs-found to have AKI superimposed on CKD.   OT comments  [name] was seen for OT treatment on this date. Upon arrival to room pt was alert, seated on EOB with RN present in room. Pt A&O x 4 denies pain and agreeable to tx. Pt instructed in falls prevention, importance of supervision during OOB and EOB activity, home/routine modification, DME/AE recommendations (reacher, grab bars, BSC, shower chair, nail apron), and energy conservation strategies. Requiring SBA + increased time standing grooming ADL tasks; MOD A for LBD seated EOB. Pt verbalized understanding /return demonstrated of instruction provided. Pt making good progress toward goals. Pt continues to benefit from skilled OT services to maximize return to PLOF and minimize risk of future falls, injury, caregiver burden, and readmission. Will continue to follow POC. Discharge recommendation remains appropriate.    Follow Up Recommendations  Home health OT;Supervision - Intermittent    Equipment Recommendations  3 in 1 bedside commode;Other (comment)(grab bars in shower)    Recommendations for Other Services      Precautions / Restrictions Precautions Precautions: Fall Restrictions Weight Bearing Restrictions: No       Mobility Bed Mobility Overal bed mobility: Modified Independent             General bed mobility comments: NT, pt found sitting EOB  Transfers Overall transfer level: Needs assistance Equipment used: Rolling walker (2 wheeled) Transfers: Sit to/from Stand Sit to Stand: (SBA)         General transfer comment: Pt demonstrated improved RW technique during sit<>stand t/f at EOB and EOC  verbalizing step by step process to self and to OT. MIN VCs to stay inside RW and for line management.     Balance Overall balance assessment: Needs assistance Sitting-balance support: Feet supported Sitting balance-Leahy Scale: Good     Standing balance support: Single extremity supported;During functional activity Standing balance-Leahy Scale: Fair Standing balance comment: SBA + UE support during dyanmic standing - unable to reach outside BOS                           ADL either performed or assessed with clinical judgement   ADL Overall ADL's : Needs assistance/impaired                                       General ADL Comments: SBA + RW + increased time tooth brushing and face washing standing sinkside. Pt dropped toothpaste cap in sink x3 while rescrewing cap 2/2 limiteded FMC of R thumb (hx of distal phalanx removed). Pt benefitted from falls prevention reinforcement t/o session as pt considers herself Independent with mobility at this time. Close SBA + RW for in room functional mobility and sit<>stand x2. Therapist reinforced need for supervision when OOB or EOB. Supervision doff sock seated EOB c reacher, MOD A don sock seated EOB. Setup for self-feeding/drinking reclined in chair.      Vision       Perception     Praxis      Cognition Arousal/Alertness: Awake/alert Behavior During Therapy: [employer] for tasks  assessed/performed Overall Cognitive Status: Within Functional Limits for tasks assessed                                 General Comments: Pt stated she used the bathroom this AM w/o assistance utilizing IV pole as support. Pt agreeable to request assistance from [place] but did not see this as a safety issue.        Exercises Exercises: Other exercises Other Exercises Other Exercises: Pt educated re: falls prevention, importance of supervision during OOB and EOB activity, home/routine modification, DME/AE recommendations  (reacher, grab bars, BSC shower chair, nail apron), and energy conservation strategies Other Exercises: Grooming standing sinkside, LBD c reacher, sit<>stand x2, room setup, self-drinking, in room functional mobility, t/f + RW technique, sitting/standing balance/tolerance   Shoulder Instructions       General Comments HR remained stable at 72 t/o 5 min standing ADLs.     Pertinent Vitals/ Pain       Pain Assessment: No/denies pain  Home Living                                          Prior Functioning/Environment              Frequency  Min 2X/week        Progress Toward Goals  OT Goals(current goals can now be found in the care plan section)  Progress towards OT goals: Progressing toward goals  Acute Rehab OT Goals Patient Stated Goal: "get stronger" OT Goal Formulation: With patient Time For Goal Achievement: 10/12/19 Potential to Achieve Goals: Good ADL Goals Pt Will Perform Grooming: with supervision;standing(with RW sink-side to complete 2-3 g/h tasks to improve fxl a) Pt Will Transfer to Toilet: with supervision;ambulating;grab bars(with RW for fxl mobility to restroom) Pt Will Perform Toileting - Clothing Manipulation and hygiene: with supervision;sit to/from stand Pt/caregiver will Perform Home Exercise Program: Increased ROM;Both right and left upper extremity;With theraband;With theraputty  Plan Discharge plan remains appropriate;Frequency remains appropriate    Co-evaluation                 AM-PAC OT "6 Clicks" Daily Activity     Outcome Measure   Help from another person eating meals?: A Little Help from another person taking care of personal grooming?: None Help from another person toileting, which includes using toliet, bedpan, or urinal?: A Little Help from another person bathing (including washing, rinsing, drying)?: A Little Help from another person to put on and taking off regular upper body clothing?: A Little Help  from another person to put on and taking off regular lower body clothing?: A Lot 6 Click Score: 18    End of Session Equipment Utilized During Treatment: Rolling walker  OT Visit Diagnosis: Unsteadiness on feet (R26.81);History of falling (Z91.81)   Activity Tolerance Patient tolerated treatment well   Patient Left in chair;with call bell/phone within reach;with chair alarm set   Nurse Communication Other (comment)(Pt left in chair)        Time: 4742-5956 OT Time Calculation (min): 25 min  Charges: OT General Charges $OT Visit: 1 Visit OT Treatments $Self Care/Home Management : 23-37 mins  Dessie Coma, M.S. OTR/L  09/30/19, 9:43 AM

## 2019-09-30 NOTE — Progress Notes (Signed)
Physical Therapy Treatment Patient Details Name: [name] MRN: [id number] DOB: [birth year] Today's Date: 09/30/2019    History of Present Illness Pt is 64 y/o F with PMH: metastatic BRCA on chemo, stage 4 CKD, hx stroke in 2010 with mild L sided weakness, and CHF. Pt presented from outpt oncology d/t abnormal labs-found to have AKI superimposed on CKD.    PT Comments    Pt pleasant and motivated to participate during the session. Pt was able to complete transfers, amb, and stair training without significant difficulty with HR and SpO2 WNL t/o session. Pt ambulated a total of 175 feet and stated that the distances felt "easy". Pt had good carryover in terms of RW use and had good stability with no instances of knees buckling during any part of the session. Pt will benefit from HHPT services upon discharge to safely address deficits listed in patient problem list for decreased caregiver assistance and eventual return to PLOF.      Follow Up Recommendations  Home health PT     Equipment Recommendations  Rolling walker with 5" wheels;3in1 (PT)    Recommendations for Other Services       Precautions / Restrictions Precautions Precautions: Fall Restrictions Weight Bearing Restrictions: No    Mobility  Bed Mobility General bed mobility comments: NT, pt found sitting EOB  Transfers Overall transfer level: Needs assistance Equipment used: Rolling walker (2 wheeled) Transfers: Sit to/from Stand Sit to Stand: Supervision         General transfer comment: Pt required verbal cueing for hand placement with the RW and took inc time and effort  Ambulation/Gait Ambulation/Gait assistance: Min guard Gait Distance (Feet): 125 Feet x1, 50 feet x1 Assistive device: Rolling walker (2 wheeled) Gait Pattern/deviations: Decreased step length - right;Decreased step length - left;Scissoring Gait velocity: decreased   General Gait Details: towards the end of her first amb bout, pt had a  sissoring gait. On her second walk, she self-corrected this. Min BUE assist from the RW   Stairs Stairs: Yes Stairs assistance: Min guard Stair Management: One rail Left;Step to pattern;Forwards Number of Stairs: 3(3 with L rail only, 2 with bilat rail) General stair comments: cueing for sequencing with teachback and visual demonstration   Wheelchair Mobility    Modified Rankin (Stroke Patients Only)       Balance Overall balance assessment: Needs assistance Sitting-balance support: Feet supported Sitting balance-Leahy Scale: Good Sitting balance - Comments: Found sitting EOB with feet on floor. Pt appeared to be steady and was able to weight shift outside of her base of support with single UE support   Standing balance support: Bilateral upper extremity supported;During functional activity Standing balance-Leahy Scale: Fair Standing balance comment: With static standing, pt able to maintain upright standing posture with UE support. With amb, pt had a very light BUE assit with the RW                            Cognition Arousal/Alertness: Awake/alert Behavior During Therapy: WFL for tasks assessed/performed Overall Cognitive Status: Within Functional Limits for tasks assessed        Exercises Total Joint Exercises Long Arc Quad: AROM;Strengthening;Both;10 reps Marching in Standing: AROM;Strengthening;Both;10 reps;Seated;Standing Other Exercises Other Exercises: Stair training    General Comments       Pertinent Vitals/Pain Pain Assessment: No/denies pain    Home Living  Prior Function            PT Goals (current goals can now be found in the care plan section) Acute Rehab PT Goals Progress towards PT goals: Progressing toward goals    Frequency    Min 2X/week      PT Plan Current plan remains appropriate    Co-evaluation              AM-PAC PT "6 Clicks" Mobility   Outcome Measure  Help needed  turning from your back to your side while in a flat bed without using bedrails?: None Help needed moving from lying on your back to sitting on the side of a flat bed without using bedrails?: A Little Help needed moving to and from a bed to a chair (including a wheelchair)?: A Little Help needed standing up from a chair using your arms (e.g., wheelchair or bedside chair)?: A Little Help needed to walk in hospital room?: A Little Help needed climbing 3-5 steps with a railing? : A Little 6 Click Score: 19    End of Session Equipment Utilized During Treatment: Gait belt Activity Tolerance: Patient tolerated treatment well Patient left: in chair;with call bell/phone within reach;with chair alarm set Nurse Communication: Mobility status PT Visit Diagnosis: Unsteadiness on feet (R26.81);History of falling (Z91.81);Muscle weakness (generalized) (M62.81);Difficulty in walking, not elsewhere classified (R26.2)     Time: 4039-7953 PT Time Calculation (min) (ACUTE ONLY): 24 min  Charges:                        Annabelle Harman, SPT 09/30/19 1:40 PM

## 2019-09-30 NOTE — Progress Notes (Signed)
[facility]  ROUNDING NOTE   Subjective:   Patient sitting at the edge of bed. She states she is feeling well.    Sodium bicarbonate infusion 69mL/hr  Creatinine 4.57 (5.7) (6.91) K 5.1 (5.2) (5.6)  Objective:  Vital signs in last 24 hours:  Temp:  [97.8 F (36.6 C)-98.4 F (36.9 C)] 98.2 F (36.8 C) (02/26 0806) Pulse Rate:  [67-75] 67 (02/26 0806) Resp:  [16-17] 17 (02/26 0806) BP: (111-154)/(71-88) 111/71 (02/26 0806) SpO2:  [98 %-99 %] 99 % (02/26 0806)  Weight change:  Filed Weights   09/27/19 1708 09/27/19 2202 09/28/19 0500  Weight: 89.8 kg 92.5 kg 81.6 kg    Intake/Output: I/O last 3 completed shifts: In: 558.9 [I.V.:558.9] Out: -    Intake/Output this shift:  Total I/O In: 480 [P.O.:480] Out: -   Physical Exam: General: NAD, sitting in chair  Head: Normocephalic, atraumatic. Moist oral mucosal membranes  Eyes: Anicteric, PERRL  Neck: Supple, trachea midline  Lungs:  Clear to auscultation  Heart: Regular rate and rhythm  Abdomen:  Soft, nontender, obese  Extremities:  + peripheral edema.  Neurologic: Nonfocal, moving all four extremities  Skin: No lesions  Access: none    Basic Metabolic Panel: Recent Labs  Lab 09/27/19 1406 09/27/19 1406 09/27/19 2242 09/27/19 2242 09/28/19 0439 09/28/19 1446 09/29/19 0601 09/30/19 0604  NA 131*  --  134*  --  134*  --  139 137  K 5.9*   < > 5.4*  --  5.6* 5.1 5.2* 5.1  CL 100  --  105  --  107  --  108 107  CO2 17*  --  16*  --  17*  --  22 25  GLUCOSE 115*  --  95  --  150*  --  180* 186*  BUN 99*  --  99*  --  106*  --  95* 81*  CREATININE 7.30*  --  6.91*  --  6.91*  --  5.70* 4.57*  CALCIUM 7.9*   < > 7.8*   < > 7.6*  --  7.5* 7.6*  PHOS  --   --  8.5*  --  8.6*  --   --   --    < > = values in this interval not displayed.    Liver Function Tests: Recent Labs  Lab 09/27/19 1406 09/27/19 2242 09/28/19 0439  AST 23  --   --   ALT 26  --   --   ALKPHOS 62  --   --   BILITOT  0.4  --   --   PROT 8.0  --   --   ALBUMIN 4.3 4.0 3.4*   No results for input(s): LIPASE, AMYLASE in the last 168 hours. No results for input(s): AMMONIA in the last 168 hours.  CBC: Recent Labs  Lab 09/27/19 1406 09/28/19 0439 09/29/19 0601 09/30/19 0604  WBC 7.9 6.1 5.6 5.7  NEUTROABS 5.6  --   --   --   HGB 9.8* 8.0* 8.5* 7.9*  HCT 29.3* 24.0* 24.2* 23.5*  MCV 96.1 96.4 93.8 96.3  PLT 379 311 315 304    Cardiac Enzymes: No results for input(s): CKTOTAL, CKMB, CKMBINDEX, TROPONINI in the last 168 hours.  BNP: Invalid input(s): POCBNP  CBG: Recent Labs  Lab 09/29/19 0746 09/29/19 1150 09/29/19 1638 09/29/19 2119 09/30/19 0807  GLUCAP 156* 244* 298* 239* 162*    Microbiology: Results for orders placed or performed during the hospital encounter  of 09/27/19  Urine Culture     Status: None   Collection Time: 09/27/19  7:00 PM   Specimen: Urine, Random  Result Value Ref Range Status   Specimen Description   Final    URINE, RANDOM Performed at [facility], [address], [place], [place] [address]    Special Requests   Final    NONE Performed at [facility], [address], [place], [place] [address]    Culture   Final    NO GROWTH Performed at [facility], [place] [address], [place], [place] [address]    Report Status 09/28/2019 FINAL  Final  SARS Coronavirus 2 by RT PCR     Status: None   Collection Time: 09/27/19  9:05 PM  Result Value Ref Range Status   SARS Coronavirus 2 NEGATIVE NEGATIVE Final    Comment: (NOTE) Result indicates the ABSENCE of SARS-CoV-2 RNA in the patient specimen.  The lowest concentration of SARS-CoV-2 viral copies this assay can detect in nasopharyngeal swab specimens is 500 copies / mL.  A negative result does not preclude SARS-CoV-2 infection and should not be used as the sole basis for patient management decisions. A negative result may occur with improper specimen collection / handling,  submission of a specimen other than nasopharyngeal swab, presence of viral mutation(s) within the areas targeted by this assay, and inadequate number of viral copies (<500 copies / mL) present.  Negative results must be combined with clinical observations, patient history, and epidemiological information.  The expected result is NEGATIVE.  Patient Fact Sheet:  BlogSelections.co.uk   Provider Fact Sheet:  https://lucas.com/   This test is not yet approved or cleared by the [place] FDA and  has been authorized for  detection and/or diagnosis of SARS-CoV-2 by FDA under an Emergency Use Authorization (EUA).  This EUA will remain in effect (meaning this test can be used) for the duration of  the COVID-19 declaration under Section 564(b)(1) of the Act, 21 U.S.C. section 360bbb-3(b)(1), unless the authorization is terminated or revoked sooner Performed at [facility], [place] [address] [place], [place] [address]     Coagulation Studies: No results for input(s): LABPROT, INR in the last 72 hours.  Urinalysis: Recent Labs    09/27/19 1900  COLORURINE YELLOW*  LABSPEC 1.012  PHURINE 5.0  GLUCOSEU NEGATIVE  HGBUR NEGATIVE  BILIRUBINUR NEGATIVE  KETONESUR NEGATIVE  PROTEINUR 30*  NITRITE NEGATIVE  LEUKOCYTESUR LARGE*      Imaging: No results found.   Medications:   . sodium bicarbonate 150 mEq in dextrose 5% 1000 mL 150 mEq (09/30/19 1012)   . amLODipine  10 mg Oral Daily  . aspirin EC  81 mg Oral Daily  . atenolol  100 mg Oral BID  . budesonide (PULMICORT) nebulizer solution  0.25 mg Nebulization BID  . calcitRIOL  0.25 mcg Oral Daily  . Chlorhexidine Gluconate Cloth  6 each Topical Q0600  . cloNIDine  0.2 mg Oral BID  . famotidine  10 mg Oral Daily  . FLUoxetine  20 mg Oral BID  . gabapentin  100 mg Oral QHS  . heparin  5,000 Units Subcutaneous Q8H  . insulin aspart  5 Units Subcutaneous BID WC  . insulin  detemir  35 Units Subcutaneous Daily  . polyethylene glycol  17 g Oral BID  . senna-docusate  2 tablet Oral BID  . simvastatin  20 mg Oral QPM  . sodium chloride flush  3 mL  Intravenous Q12H   acetaminophen **OR** acetaminophen, albuterol, ALPRAZolam, Melatonin, oxyCODONE-acetaminophen, traMADol  Assessment/ Plan:  Ms. CHRISTYN GUTKOWSKI is a 64 y.o. black female with metastatic breast cancer, diabetes mellitus type II insulin dependent, asthma, CVA, hypertension, congestive heart failure, depression, hypertension and hyperlipidemia.   1. Acute renal failure with hyperkalemia and metabolic acidosis on chronic kidney disease stage IV: baseline creatinine of 3.21, GFR of 17 on 09/16/19.  Acute renal failure seems secondary to bactrim however prerenal azotemia cannot be ruled out.  No indication for dialysis.  IV fluids: sodium bicarbonate  4. Anemia with chronic kidney disease: hemoglobin 7.9, normocytic.   5. Secondary Hyperparathyroidism: on calcitriol - Pending PTH  6. Hypertension with chronic kidney disease:  - amlodipine, atenolol and clonidine.    LOS: 2 Kentavious Michele 2/26/202110:45 AM

## 2019-09-30 NOTE — Care Management Important Message (Signed)
Important Message  Patient Details  Name: [name] MRN: [id number] Date of Birth: [birth year]   Medicare Important Message Given:  N/A - LOS <3 / Initial given by admissions     [name] 09/30/2019, 8:12 AM

## 2019-09-30 NOTE — Progress Notes (Signed)
Inpatient Diabetes Program Recommendations  AACE/ADA: New Consensus Statement on Inpatient Glycemic Control (2015)  Target Ranges:  Prepandial:   less than 140 mg/dL      Peak postprandial:   less than 180 mg/dL (1-2 hours)      Critically ill patients:  140 - 180 mg/dL   Lab Results  Component Value Date   GLUCAP 162 (H) 09/30/2019   HGBA1C 9.0 (H) 09/17/2018    Review of Glycemic Control Results for ANERI, SLAGEL (MRN [id number]) as of 09/30/2019 09:46  Ref. Range 09/29/2019 07:46 09/29/2019 11:50 09/29/2019 16:38 09/29/2019 21:19 09/30/2019 08:07  Glucose-Capillary Latest Ref Range: 70 - 99 mg/dL 156 (H) 244 (H) 298 (H) 239 (H) 162 (H)   Diabetes history: DM 2 Outpatient Diabetes medications: Glyburide 10 mg bid, Levemir 55 units Daily, Novolog 7 units bid with meals Current orders for Inpatient glycemic control:  Levemir 35 units Daily, Novolog 5 units bid with meals  Inpatient Diabetes Program Recommendations:    Consdier adding Novolog "very sensitive scale" 0-6 units tid starting at 150 mg/dl.  Thanks,  Tama Headings RN, MSN, BC-ADM Inpatient Diabetes Coordinator Team Pager [phone] (8a-5p)

## 2019-09-30 NOTE — Progress Notes (Signed)
PROGRESS NOTE    [name]  TDV:[id number] DOB: 05-30-1956 DOA: 09/27/2019 PCP: [name], MD      Assessment & Plan:   Principal Problem:   Acute renal failure superimposed on stage 4 chronic kidney disease ([place]) Active Problems:   Asthma   Chronic systolic CHF (congestive heart failure) ([place])   Depression with anxiety   Type 2 diabetes mellitus with diabetic chronic kidney disease ([place])   Anemia of chronic disease   Metastatic breast cancer ([place])   Hypertension associated with diabetes ([place])   Hyperlipidemia associated with type 2 diabetes mellitus ([facility])   Hyperkalemia   Metabolic acidosis   AKI on CKD stage IV: history suggestive of multifactorial cause in setting of decreased oral intake and recent bactrim use. Cr continues to trend down daily. Continue on IVFs.  Renal U/S shows medical renal disease. Continue to hold bumex, enalapril. D/C bactrim. Nephro following and recs apprec   Hyperkalemia: s/p calcium gluconate, lokelma x 2. Resolved  Metabolic acidosis: resolved  Stage IV left breast cancer:  with osseous metastasis. Follows with oncology outpatient, completed 6 cycles of eribulin.  Chronic systolic CHF: overall compensated without evidence of acute exacerbation.  Holding enalapril and bumex as above.  Hypertension: continue to hold enalapril and bumex as above.  Continue home amlodipine, clonidine, atenolol. Hold anti-HTN for MAP <65  DM2: continue levemir, novoLog. Carb modified/renal diet   Asthma: without exacerbation.  Continue flovent and as needed albuterol.  Constipation: resolved  Abnormal urinalysis: without urinary symptoms.  Urine cx shows no growth.  Given IV ceftriaxone in the ED.  Hold further antibiotics.  Anemia of chronic disease: chronic and stable without evidence of bleeding. Will continue to monitor   Recent right thumb abscess: prior history of distal right thumb amputation, recently seen by orthopedics on  09/19/2019 for distal right thumb abscess.  Abscess was punctured and she was started on prophylactic bactrim and cephalexin.  Appears well-healed. Abx d/c  History of CVA: continue aspirin and statin.  Hyperlipidemia: continue simvastatin.  Depression with anxiety: severity unknown. Continue fluoxetine.  Generalized weakness: PT/OT recs home health. Home health orders have been placed   Likely peripheral neuropathy: will continue on gabapentin    DVT prophylaxis: heparin Code Status: full  Family Communication: Disposition Plan: will likely d/c w/ HH but still AKI on CKD so will likely need a couple more days here   Consultants:   nephro   Procedures:    Antimicrobials: n/a   Subjective: Pt c/o malaise  Objective: Vitals:   09/29/19 2150 09/29/19 2333 09/30/19 0740 09/30/19 0806  BP: (!) 141/88 116/73  111/71  Pulse: 72 70  67  Resp:  16  17  Temp: 98.4 F (36.9 C) 98.3 F (36.8 C)  98.2 F (36.8 C)  TempSrc: Oral Oral  Oral  SpO2: 99% 99% 98% 99%  Weight:      Height:        Intake/Output Summary (Last 24 hours) at 09/30/2019 0626 Last data filed at 09/29/2019 2154 Gross per 24 hour  Intake 6 ml  Output --  Net 6 ml   Filed Weights   09/27/19 1708 09/27/19 2202 09/28/19 0500  Weight: 89.8 kg 92.5 kg 81.6 kg    Examination:  General exam: Appears calm and comfortable  Respiratory system: decreased breath sounds b/l. No rales Cardiovascular system: S1 & S2 +. No rubs, gallops or clicks. B/l LE edema Gastrointestinal system: Abdomen is nondistended, soft and nontender. Normal bowel sounds  heard. Central nervous system: Alert and oriented. Moves all 4 extremities  Psychiatry: Judgement and insight appear normal. Mood & affect appropriate.     Data Reviewed: I have personally reviewed following labs and imaging studies  CBC: Recent Labs  Lab 09/27/19 1406 09/28/19 0439 09/29/19 0601 09/30/19 0604  WBC 7.9 6.1 5.6 5.7  NEUTROABS 5.6  --    --   --   HGB 9.8* 8.0* 8.5* 7.9*  HCT 29.3* 24.0* 24.2* 23.5*  MCV 96.1 96.4 93.8 96.3  PLT 379 311 315 099   Basic Metabolic Panel: Recent Labs  Lab 09/27/19 1406 09/27/19 1406 09/27/19 2242 09/28/19 0439 09/28/19 1446 09/29/19 0601 09/30/19 0604  NA 131*  --  134* 134*  --  139 137  K 5.9*   < > 5.4* 5.6* 5.1 5.2* 5.1  CL 100  --  105 107  --  108 107  CO2 17*  --  16* 17*  --  22 25  GLUCOSE 115*  --  95 150*  --  180* 186*  BUN 99*  --  99* 106*  --  95* 81*  CREATININE 7.30*  --  6.91* 6.91*  --  5.70* 4.57*  CALCIUM 7.9*  --  7.8* 7.6*  --  7.5* 7.6*  PHOS  --   --  8.5* 8.6*  --   --   --    < > = values in this interval not displayed.   GFR: Estimated Creatinine Clearance: 12.9 mL/min (A) (by C-G formula based on SCr of 4.57 mg/dL (H)). Liver Function Tests: Recent Labs  Lab 09/27/19 1406 09/27/19 2242 09/28/19 0439  AST 23  --   --   ALT 26  --   --   ALKPHOS 62  --   --   BILITOT 0.4  --   --   PROT 8.0  --   --   ALBUMIN 4.3 4.0 3.4*   No results for input(s): LIPASE, AMYLASE in the last 168 hours. No results for input(s): AMMONIA in the last 168 hours. Coagulation Profile: No results for input(s): INR, PROTIME in the last 168 hours. Cardiac Enzymes: No results for input(s): CKTOTAL, CKMB, CKMBINDEX, TROPONINI in the last 168 hours. BNP (last 3 results) No results for input(s): PROBNP in the last 8760 hours. HbA1C: No results for input(s): HGBA1C in the last 72 hours. CBG: Recent Labs  Lab 09/29/19 0746 09/29/19 1150 09/29/19 1638 09/29/19 2119 09/30/19 0807  GLUCAP 156* 244* 298* 239* 162*   Lipid Profile: No results for input(s): CHOL, HDL, LDLCALC, TRIG, CHOLHDL, LDLDIRECT in the last 72 hours. Thyroid Function Tests: No results for input(s): TSH, T4TOTAL, FREET4, T3FREE, THYROIDAB in the last 72 hours. Anemia Panel: No results for input(s): VITAMINB12, FOLATE, FERRITIN, TIBC, IRON, RETICCTPCT in the last 72 hours. Sepsis Labs: No  results for input(s): PROCALCITON, LATICACIDVEN in the last 168 hours.  Recent Results (from the past 240 hour(s))  Urine Culture     Status: None   Collection Time: 09/27/19  7:00 PM   Specimen: Urine, Random  Result Value Ref Range Status   Specimen Description   Final    URINE, RANDOM Performed at [facility], [address], [place], [place] [address]    Special Requests   Final    NONE Performed at [facility], [address], [place], [place] [address]    Culture   Final    NO GROWTH Performed at [facility], [address] [place], [place] [address]    Report Status 09/28/2019 FINAL  Final  SARS Coronavirus 2 by RT PCR     Status: None   Collection Time: 09/27/19  9:05 PM  Result Value Ref Range Status   SARS Coronavirus 2 NEGATIVE NEGATIVE Final    Comment: (NOTE) Result indicates the ABSENCE of SARS-CoV-2 RNA in the patient specimen.  The lowest concentration of SARS-CoV-2 viral copies this assay can detect in nasopharyngeal swab specimens is 500 copies / mL.  A negative result does not preclude SARS-CoV-2 infection and should not be used as the sole basis for patient management decisions. A negative result may occur with improper specimen collection / handling, submission of a specimen other than nasopharyngeal swab, presence of viral mutation(s) within the areas targeted by this assay, and inadequate number of viral copies (<500 copies / mL) present.  Negative results must be combined with clinical observations, patient history, and epidemiological information.  The expected result is NEGATIVE.  Patient Fact Sheet:  BlogSelections.co.uk   Provider Fact Sheet:  https://lucas.com/   This test is not yet approved or cleared by the [place] FDA and  has been authorized for  detection and/or diagnosis of SARS-CoV-2 by FDA under an Emergency Use Authorization (EUA).  This EUA will remain  in effect (meaning this test can be used) for the duration of  the COVID-19 declaration under Section 564(b)(1) of the Act, 21 U.S.C. section 360bbb-3(b)(1), unless the authorization is terminated or revoked sooner Performed at [facility], [place] [address], [place], [place] [address]          Radiology Studies: US RENAL  Result Date: 09/28/2019 CLINICAL DATA:  Acute renal failure superimposed on stage 4 chronic kidney disease. EXAM: RENAL / URINARY TRACT ULTRASOUND COMPLETE COMPARISON:  September 18, 2015. FINDINGS: Right Kidney: Renal measurements: 10.1 x 4.9 x 4.2 cm = volume: 107 mL. Increased echogenicity of renal parenchyma is noted. 2 simple cysts are noted, the largest measuring 1.8 cm in midpole. No mass or hydronephrosis visualized. Left Kidney: Renal measurements: 10.3 x 4.8 x 4.2 cm = volume: 103 mL. Increased echogenicity of renal parenchyma is noted suggesting medical renal disease. 6 mm simple cyst is seen in upper pole. No mass or hydronephrosis visualized. Bladder: Appears normal for degree of bladder distention. Other: None. IMPRESSION: Increased echogenicity of renal parenchyma is noted bilaterally suggesting medical renal disease. No hydronephrosis or renal obstruction is noted. Bilateral renal cysts are noted. Electronically Signed   By: Marijo Conception M.D.   On: 09/28/2019 10:33        Scheduled Meds: . amLODipine  10 mg Oral Daily  . aspirin EC  81 mg Oral Daily  . atenolol  100 mg Oral BID  . budesonide (PULMICORT) nebulizer solution  0.25 mg Nebulization BID  . calcitRIOL  0.25 mcg Oral Daily  . Chlorhexidine Gluconate Cloth  6 each Topical Q0600  . cloNIDine  0.2 mg Oral BID  . famotidine  10 mg Oral Daily  . FLUoxetine  20 mg Oral BID  . gabapentin  100 mg Oral QHS  . heparin  5,000 Units Subcutaneous Q8H  . insulin aspart  5 Units Subcutaneous BID WC  . insulin detemir  35 Units Subcutaneous Daily  . polyethylene glycol  17 g Oral BID  .  senna-docusate  2 tablet Oral BID  . simvastatin  20 mg Oral QPM  . sodium chloride flush  3 mL Intravenous Q12H  Continuous Infusions: . sodium bicarbonate 150 mEq in dextrose 5% 1000 mL 50 mL/hr at 09/29/19 1532     LOS: 2 days    Time spent: 30 mins    Wyvonnia Dusky, MD [facility] Pager [phone]  If 7PM-7AM, please contact night-coverage [email or web address] 09/30/2019, 8:22 AM

## 2019-10-01 LAB — IRON AND TIBC
Iron: 101 ug/dL (ref 28–170)
Saturation Ratios: 42 % — ABNORMAL HIGH (ref 10.4–31.8)
TIBC: 242 ug/dL — ABNORMAL LOW (ref 250–450)
UIBC: 141 ug/dL

## 2019-10-01 LAB — QUANTIFERON-TB GOLD PLUS: QuantiFERON-TB Gold Plus: NEGATIVE

## 2019-10-01 LAB — RETICULOCYTES
Immature Retic Fract: 17.3 % — ABNORMAL HIGH (ref 2.3–15.9)
RBC.: 2.45 MIL/uL — ABNORMAL LOW (ref 3.87–5.11)
Retic Count, Absolute: 101.4 10*3/uL (ref 19.0–186.0)
Retic Ct Pct: 4.1 % — ABNORMAL HIGH (ref 0.4–3.1)

## 2019-10-01 LAB — QUANTIFERON-TB GOLD PLUS (RQFGPL)
QuantiFERON Mitogen Value: >10 IU/mL
QuantiFERON Nil Value: 0.02 IU/mL
QuantiFERON TB1 Ag Value: 0.04 IU/mL
QuantiFERON TB2 Ag Value: 0.05 IU/mL

## 2019-10-01 LAB — CBC
HCT: 23.1 % — ABNORMAL LOW (ref 36.0–46.0)
Hemoglobin: 7.9 g/dL — ABNORMAL LOW (ref 12.0–15.0)
MCH: 32.5 pg (ref 26.0–34.0)
MCHC: 34.2 g/dL (ref 30.0–36.0)
MCV: 95.1 fL (ref 80.0–100.0)
Platelets: 291 10*3/uL (ref 150–400)
RBC: 2.43 MIL/uL — ABNORMAL LOW (ref 3.87–5.11)
RDW: 14 % (ref 11.5–15.5)
WBC: 6.1 10*3/uL (ref 4.0–10.5)
nRBC: 0 % (ref 0.0–0.2)

## 2019-10-01 LAB — GLUCOSE, CAPILLARY
Glucose-Capillary: 155 mg/dL — ABNORMAL HIGH (ref 70–99)
Glucose-Capillary: 145 mg/dL — ABNORMAL HIGH (ref 70–99)
Glucose-Capillary: 166 mg/dL — ABNORMAL HIGH (ref 70–99)

## 2019-10-01 LAB — BASIC METABOLIC PANEL
Anion gap: 5 (ref 5–15)
BUN: 67 mg/dL — ABNORMAL HIGH (ref 8–23)
CO2: 28 mmol/L (ref 22–32)
Calcium: 7.8 mg/dL — ABNORMAL LOW (ref 8.9–10.3)
Chloride: 105 mmol/L (ref 98–111)
Creatinine, Ser: 3.68 mg/dL — ABNORMAL HIGH (ref 0.44–1.00)
GFR calc Af Amer: 14 mL/min — ABNORMAL LOW (ref >60)
GFR calc non Af Amer: 12 mL/min — ABNORMAL LOW (ref >60)
Glucose, Bld: 167 mg/dL — ABNORMAL HIGH (ref 70–99)
Potassium: 5.1 mmol/L (ref 3.5–5.1)
Sodium: 138 mmol/L (ref 135–145)

## 2019-10-01 LAB — FOLATE: Folate: 3.7 ng/mL — ABNORMAL LOW (ref >5.9)

## 2019-10-01 LAB — PARATHYROID HORMONE, INTACT (NO CA): PTH: 244 pg/mL — ABNORMAL HIGH (ref 15–65)

## 2019-10-01 LAB — FERRITIN: Ferritin: 725 ng/mL — ABNORMAL HIGH (ref 11–307)

## 2019-10-01 NOTE — Progress Notes (Signed)
[name]  MRN: [id number]  DOB/AGE: 06-Aug-1955 64 y.o.  Primary Care Physician:Hande, Cherlyn Labella, MD  Admit date: 09/27/2019  Chief Complaint:  Chief Complaint  Patient presents with  . Abnormal Lab    S-Pt presented on  09/27/2019 with  Chief Complaint  Patient presents with  . Abnormal Lab  .    Pt today feels better    Patient main complaint during today's visit was I want to go home  Medications  . amLODipine  10 mg Oral Daily  . aspirin EC  81 mg Oral Daily  . atenolol  100 mg Oral BID  . budesonide (PULMICORT) nebulizer solution  0.25 mg Nebulization BID  . calcitRIOL  0.25 mcg Oral Daily  . Chlorhexidine Gluconate Cloth  6 each Topical Q0600  . cloNIDine  0.2 mg Oral BID  . famotidine  10 mg Oral Daily  . FLUoxetine  20 mg Oral BID  . gabapentin  100 mg Oral QHS  . heparin  5,000 Units Subcutaneous Q8H  . insulin aspart  5 Units Subcutaneous BID WC  . insulin detemir  35 Units Subcutaneous Daily  . polyethylene glycol  17 g Oral BID  . senna-docusate  2 tablet Oral BID  . simvastatin  20 mg Oral QPM  . sodium chloride flush  3 mL Intravenous Q12H         JGG:[id number] from the symptoms mentioned above,there are no other symptoms referable to all systems reviewed.  Physical Exam: Vital signs in last 24 hours: Temp:  [97.9 F (36.6 C)-98.3 F (36.8 C)] 97.9 F (36.6 C) (02/26 2313) Pulse Rate:  [70-75] 75 (02/26 2313) Resp:  [16-18] 16 (02/26 2313) BP: (123-138)/(75-77) 136/75 (02/26 2313) SpO2:  [98 %-100 %] 100 % (02/26 2313) Weight change:  Last BM Date: 09/29/19  Intake/Output from previous day: 02/26 0701 - 02/27 0700 In: 960 [P.O.:960] Out: -  No intake/output data recorded.   Physical Exam: General- pt is awake,alert, oriented to time place and person Resp- No acute REsp distress, CTA B/L NO Rhonchi CVS- S1S2 regular in rate and rhythm GIT- BS+, soft, NT, ND EXT- NO LE Edema, Cyanosis   Lab Results: CBC Recent Labs   09/30/19 0604 10/01/19 0527  WBC 5.7 6.1  HGB 7.9* 7.9*  HCT 23.5* 23.1*  PLT 304 291    BMET Recent Labs    09/30/19 0604 10/01/19 0527  NA 137 138  K 5.1 5.1  CL 107 105  CO2 25 28  GLUCOSE 186* 167*  BUN 81* 67*  CREATININE 4.57* 3.68*  CALCIUM 7.6* 7.8*   Creatinine trend 2021 6.9==> 3.6 3.0--3.8-baseline range 2020 2.6--3.6 2019 3.5--4.3-AKI 2018 3.5--4.1-AKI 2017 2.8--3.6-AKI 2016 2.0--2.5 2015 1.6  MICRO Recent Results (from the past 240 hour(s))  Urine Culture     Status: None   Collection Time: 09/27/19  7:00 PM   Specimen: Urine, Random  Result Value Ref Range Status   Specimen Description   Final    URINE, RANDOM Performed at [facility], [address], [place], [place] [address]    Special Requests   Final    NONE Performed at [facility], [address], [place], [place] [address]    Culture   Final    NO GROWTH Performed at [facility], [place] [address], [place], [place] [address]    Report Status 09/28/2019 FINAL  Final  SARS Coronavirus 2 by RT PCR     Status: None   Collection  Time: 09/27/19  9:05 PM  Result Value Ref Range Status   SARS Coronavirus 2 NEGATIVE NEGATIVE Final    Comment: (NOTE) Result indicates the ABSENCE of SARS-CoV-2 RNA in the patient specimen.  The lowest concentration of SARS-CoV-2 viral copies this assay can detect in nasopharyngeal swab specimens is 500 copies / mL.  A negative result does not preclude SARS-CoV-2 infection and should not be used as the sole basis for patient management decisions. A negative result may occur with improper specimen collection / handling, submission of a specimen other than nasopharyngeal swab, presence of viral mutation(s) within the areas targeted by this assay, and inadequate number of viral copies (<500 copies / mL) present.  Negative results must be combined with clinical observations, patient history, and epidemiological information.  The  expected result is NEGATIVE.  Patient Fact Sheet:  BlogSelections.co.uk   Provider Fact Sheet:  https://lucas.com/   This test is not yet approved or cleared by the [place] FDA and  has been authorized for  detection and/or diagnosis of SARS-CoV-2 by FDA under an Emergency Use Authorization (EUA).  This EUA will remain in effect (meaning this test can be used) for the duration of  the COVID-19 declaration under Section 564(b)(1) of the Act, 21 U.S.C. section 360bbb-3(b)(1), unless the authorization is terminated or revoked sooner Performed at [facility], [place] [address] [place], [place] [address]       Lab Results  Component Value Date   PTH 244 (H) 09/30/2019   CALCIUM 7.8 (L) 10/01/2019   PHOS 8.6 (H) 09/28/2019               Impression:  [name] is a 64 year old African-American femaleerican female with metastatic breast cancer, diabetes mellitus type II insulin dependent, asthma, CVA, hypertension, congestive heart failure, depression, hypertension and hyperlipidemia.     1)Renal  AKI secondary to ATN/prerenal AKI had possible contribution from Bactrim/ACE/diuretics Patient has AKI on CKD Patient has baseline CKD stage IV          Patient has CKD since 2015-most likely before that          Patient has CKD most likely secondary to diabetes mellitus              Patient CKD progression has been marked with multiple episodes of acute kidney injury              Patient AKI is now better Patient creatinine is trending down   2)HTN Patient blood pressure is at goal   Medication- On Calcium Channel Blockers On Beta blockers On Central Acting Sympatholytics    3)Anemia of chronic disease-patient has CKD as well as stage IV left breast cancer  HGb is not at goal (9--11) We will check anemia profile then decide about need benefit risk of Epogen   4) secondary hyperparathyroidism -CKD Mineral-Bone  Disorder   Secondary Hyperparathyroidism present . Patient is on calcitriol   Phosphorus is not at goal. Will recheck phosphorus if not better will start patient on binders  5) diabetes mellitus Patient has diabetes mellitus is being managed by the primary team  6) electrolytes   sodium Normonatremic   potassium Normokalemic    7)Acid base Co2 at goal Patient earlier had acidosis which is now much better Patient is on bicarb drip We will DC bicarb    Plan:  Will DC bicarb drip. We will follow up on Chem-7. Educated patient with current GFR and When patient is discharged,she  will benefit from outpatient nephrology follow-up     Emmelyn Schmale s [facility] 10/01/2019, 9:30 AM

## 2019-10-01 NOTE — TOC Transition Note (Addendum)
Transition of Care [facility]) - CM/SW Discharge Note   Patient Details  Name: JENTRI AYE MRN: [id number] Date of Birth: 01/08/1956  Transition of Care [facility]) CM/SW Contact:  Boris Sharper, LCSW Phone Number: [phone] 10/01/2019, 1:51 PM   Clinical Narrative:    Patient medically stable for discharge. CSW notified patient and her son or discharge. CSW left Helene Kelp of [facility] a voicemail notifying her on patients  Discharge. CSW notified Brad with [facility] and delivered 3 n1 and rolling walker to pt's room.   Final next level of care: [place] Barriers to Discharge: No Barriers Identified   Patient Goals and CMS Choice Patient states their goals for this hospitalization and ongoing recovery are:: to gain strength and be independent      Discharge Placement                Patient to be transferred to facility by: Son Name of family member notified: Lanny Hurst Patient and family notified of of transfer: 10/01/19  Discharge Plan and Services   Discharge Planning Services: CM Consult            DME Arranged: 3-N-1, Walker rolling DME Agency: [employer] Date DME Agency Contacted: 10/01/19 Time DME Agency Contacted: [phone] Representative spoke with at DME Agency: [place]: PT, OT [place] Agency: [facility] (now [facility]) Date [place]: 10/01/19 Time [place]: [place] Representative spoke with at [place]: Ocean[employer])  Social Determinants of Health ([place]) Interventions     Readmission Risk Interventions No flowsheet data found.

## 2019-10-01 NOTE — Discharge Summary (Signed)
Physician Discharge Summary  [name] NOM:[id number] DOB: [birth year] DOA: 09/27/2019  PCP: [name], MD  Admit date: 09/27/2019 Discharge date: 10/01/2019  Admitted From: home Disposition: home w/ home health  Recommendations for Outpatient Follow-up:  1. Follow up with PCP in 1-2 weeks 2. F/u nephro, Dr. Juleen China w/in 1 week  Home Health: yes Equipment/Devices: walker, bedside commode   Discharge Condition: stable CODE STATUS: full  Diet recommendation: Heart Healthy /Renal/Carb modified   Brief/Interim Summary: HPI was taken from Dr. Zada Finders:  [name] is a 64 y.o. 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  Patient reports about 2 weeks of slight swelling in both of her legs associated with cramping sensation.  She has some generalized weakness and reports having a couple minor falls at times when she felt as if her knees were giving out.  She denies any significant injury.  She reports constipation with last bowel movement occurring 7 days ago.  She reports good urine output without dysuria.  She denies any subjective fevers, chills, diaphoresis, chest pain, palpitations, dyspnea, abdominal pain.  She was seen at her oncology office today.  She underwent bilateral lower extremity ultrasound which were negative for DVT.  A 3.4 cm left popliteal fossa Baker's cyst was noted.  2 view chest x-ray showed right-sided Port-A-Cath in place without active cardiopulmonary disease.  Abdominal x-ray showed large amount of dense stool throughout the colon without evidence of obstruction.  Labs were obtained and notable for BUN 99, creatinine 7.3, GFR 6 (creatinine 3.1, GFR 18 on  09/20/2019), sodium 131, potassium 5.9, chloride 100, bicarb 17, WBC 7.9, hemoglobin 9.8, platelets 379,000, BNP 60.  Due to her worsening renal function she was advised to present to the ED for further evaluation and management.  Of note, patient was recently seen by her orthopedic physician for evaluation of small abscess at the distal tip of the right thumb on 09/19/2019.  Per documentation there was no surrounding cellulitis.  Abscess was punctured and cultures were collected and sent.  She was started on prophylactic Keflex and Bactrim.  ED Course:  Initial vitals show BP 118/68, pulse 68, RR 20, temp 97.6 Fahrenheit, SPO2 98% on room air.  Urinalysis showed negative nitrites, large leukocytes, 21-50 RBC/hpf, 21-WBC/hpf, no bacteria on microscopy.  Patient was given 1 L LR, IV calcium gluconate 1 g, and IV ceftriaxone.  The hospitalist service was consulted admit for further evaluation management.   Hospital course from Dr. Lenise Herald 2/24-2/27/21: Pt was found to have AKI on CKDIV likely secondary to decreased oral intake and recent bactrim use. Bactrim was d/c and pt was started on IVFs. Pt's Cr was 3.68 & GFR was 14 on the day of d/c. Baseline Cr is 3.21/GFR of 17 as per nephro. Of note, pt was also found to have hyperkalemia and was given calcium gluconate & lokelma x2. Pt's home dose of bumex and enalapril were held while the pt was inpatient. Hyperkalemia resolved prior to d/c. Also, PT/OT saw the pt and recommended home health which was set up prior to d/c.  Discharge Diagnoses:  Principal Problem:   Acute renal failure superimposed on stage 4 chronic kidney disease (HCC) Active Problems:   Asthma   Chronic systolic CHF (congestive heart failure) (  [place])   Depression with anxiety   Type 2 diabetes mellitus with diabetic chronic kidney disease ([place])   Anemia of chronic disease   Metastatic breast cancer ([place])   Hypertension associated with diabetes ([place])   Hyperlipidemia  associated with type 2 diabetes mellitus (HCC)   Hyperkalemia   Metabolic acidosis  AKI on CKD stage IV: history suggestive of multifactorial cause in setting of decreased oral intake and recent bactrim use. Cr continues to trend down daily. Continue on IVFs.  Renal U/S shows medical renal disease. Continue to hold bumex, enalapril while inpatient. D/C bactrim. Nephro following and recs apprec   Hyperkalemia: s/p calcium gluconate, lokelma x 2. Resolved  Metabolic acidosis: resolved  Stage IV left breast cancer:  with osseous metastasis. Follows with oncology outpatient, completed 6 cycles of eribulin.  Chronic systolic CHF: overall compensated without evidence of acute exacerbation.  Holding enalapril and bumex as above.  Hypertension: continue to hold enalapril and bumex as above.  Continue home amlodipine, clonidine, atenolol. Hold anti-HTN for MAP <65  DM2: continue levemir, novoLog. Carb modified/renal diet   Asthma: without exacerbation.  Continue flovent and as needed albuterol.  Constipation: resolved  Abnormal urinalysis: without urinary symptoms.  Urine cx shows no growth.  Given IV ceftriaxone in the ED.  Hold further antibiotics.  Anemia of chronic disease: chronic and stable without evidence of bleeding. Will continue to monitor   Recent right thumb abscess: prior history of distal right thumb amputation, recently seen by orthopedics on 09/19/2019 for distal right thumb abscess.  Abscess was punctured and she was started on prophylactic bactrim and cephalexin.  Appears well-healed. Abx d/c  History of CVA: continue aspirin and statin.  Hyperlipidemia: continue simvastatin.  Depression with anxiety: severity unknown. Continue fluoxetine.  Generalized weakness: PT/OT recs home health. Home health orders have been placed   Likely peripheral neuropathy: will continue on gabapentin   Discharge Instructions  Discharge Instructions    Diet - low sodium heart  healthy   Complete by: As directed    Diet Carb Modified   Complete by: As directed    Discharge instructions   Complete by: As directed    F/u PCP in 1-2 weeks; F/u nephron w/in 1 week   Increase activity slowly   Complete by: As directed      Allergies as of 10/01/2019      Reactions   Fish-derived Products       Medication List    TAKE these medications   acetaminophen-codeine 300-60 MG tablet Commonly known as: TYLENOL #4 Take 1 tablet by mouth 2 (two) times daily as needed for moderate pain.   ALPRAZolam 0.5 MG tablet Commonly known as: XANAX Take 0.5 mg by mouth 2 (two) times daily as needed for anxiety or sleep.   amLODipine 10 MG tablet Commonly known as: NORVASC Take 10 mg by mouth daily.   aspirin EC 81 MG tablet Take 81 mg by mouth daily.   atenolol 100 MG tablet Commonly known as: TENORMIN Take 100 mg by mouth 2 (two) times daily.   bumetanide 0.5 MG tablet Commonly known as: BUMEX Take 0.5 mg by mouth 2 (two) times daily.   calcitRIOL 0.25 MCG capsule Commonly known as: ROCALTROL Take 0.25 mcg by mouth daily.   Cinnamon 500 MG capsule Take 500 mg by mouth 2 (two) times daily.   cloNIDine 0.2 MG tablet Commonly known as: CATAPRES Take 0.2 mg by mouth 2 (two) times daily.   diflorasone 0.05 % ointment  Commonly known as: PSORCON Apply 1 application topically as directed.   ELDERBERRY PO Take 1 tablet by mouth daily.   enalapril 10 MG tablet Commonly known as: VASOTEC Take 20 mg by mouth 2 (two) times a day.   famotidine 20 MG tablet Commonly known as: PEPCID Take 20 mg by mouth 2 (two) times daily.   ferrous sulfate 325 (65 FE) MG tablet Take 325 mg by mouth 2 (two) times daily with a meal.   Flovent Diskus 100 MCG/BLIST Aepb Generic drug: Fluticasone Propionate (Inhal) Inhale 2 puffs into the lungs 2 (two) times daily as needed (respiratory problems).   FLUoxetine 20 MG capsule Commonly known as: PROZAC Take 20 mg by mouth 2 (two)  times daily.   gabapentin 100 MG capsule Commonly known as: NEURONTIN TAKE 1 CAPSULE(100 MG) BY MOUTH AT BEDTIME What changed: See the new instructions.   glyBURIDE 5 MG tablet Commonly known as: DIABETA Take 10 mg by mouth 2 (two) times daily with a meal.   Imodium A-D 2 MG capsule Generic drug: loperamide Take 2-4 mg by mouth 4 (four) times daily as needed for diarrhea or loose stools.   Levemir FlexTouch 100 UNIT/ML Pen Generic drug: Insulin Detemir Inject 55 Units into the skin daily.   NovoLOG FlexPen 100 UNIT/ML FlexPen Generic drug: insulin aspart Inject 7 Units into the skin 2 (two) times daily.   oxyCODONE-acetaminophen 5-325 MG tablet Commonly known as: PERCOCET/ROXICET Take 1 tablet by mouth daily as needed for severe pain.   ProAir HFA 108 (90 Base) MCG/ACT inhaler Generic drug: albuterol Inhale 2 puffs into the lungs every 6 (six) hours as needed for wheezing or shortness of breath.   albuterol (2.5 MG/3ML) 0.083% nebulizer solution Commonly known as: PROVENTIL Inhale 2.5 mg into the lungs every 6 (six) hours as needed for shortness of breath.   simvastatin 20 MG tablet Commonly known as: ZOCOR Take 20 mg by mouth every evening.   vitamin B-12 1000 MCG tablet Commonly known as: CYANOCOBALAMIN Take 1,000 mcg by mouth daily.            Durable Medical Equipment  (From admission, onward)         Start     Ordered   09/28/19 1357  For home use only DME Walker rolling  Once    Question Answer Comment  Walker: With 5 Inch Wheels   Patient needs a walker to treat with the following condition Generalized weakness      09/28/19 1356   09/28/19 1357  For home use only DME Bedside commode  Once    Question:  Patient needs a bedside commode to treat with the following condition  Answer:  Generalized weakness   09/28/19 1356          Allergies  Allergen Reactions  . Fish-Derived Products     Consultations:  Nephro, Dr.  Juleen China   Procedures/Studies: DG Chest 2 View  Result Date: 09/27/2019 CLINICAL DATA:  64 year old female with constipation. EXAM: CHEST - 2 VIEW COMPARISON:  Chest CT dated 02/16/2018. FINDINGS: Right-sided Port-A-Cath with tip over central SVC. Minimal left lung base subsegmental atelectasis. No focal consolidation, pleural effusion, or pneumothorax. The cardiac silhouette is within normal limits. No acute osseous pathology. IMPRESSION: No active cardiopulmonary disease. Electronically Signed   By: Anner Crete M.D.   On: 09/27/2019 17:23   US RENAL  Result Date: 09/28/2019 CLINICAL DATA:  Acute renal failure superimposed on stage 4 chronic kidney disease. EXAM: RENAL / URINARY  TRACT ULTRASOUND COMPLETE COMPARISON:  September 18, 2015. FINDINGS: Right Kidney: Renal measurements: 10.1 x 4.9 x 4.2 cm = volume: 107 mL. Increased echogenicity of renal parenchyma is noted. 2 simple cysts are noted, the largest measuring 1.8 cm in midpole. No mass or hydronephrosis visualized. Left Kidney: Renal measurements: 10.3 x 4.8 x 4.2 cm = volume: 103 mL. Increased echogenicity of renal parenchyma is noted suggesting medical renal disease. 6 mm simple cyst is seen in upper pole. No mass or hydronephrosis visualized. Bladder: Appears normal for degree of bladder distention. Other: None. IMPRESSION: Increased echogenicity of renal parenchyma is noted bilaterally suggesting medical renal disease. No hydronephrosis or renal obstruction is noted. Bilateral renal cysts are noted. Electronically Signed   By: Marijo Conception M.D.   On: 09/28/2019 10:33   US Venous Img Lower Bilateral  Result Date: 09/27/2019 CLINICAL DATA:  Bilateral lower extremity edema and pain EXAM: BILATERAL LOWER EXTREMITY VENOUS DOPPLER ULTRASOUND TECHNIQUE: Gray-scale sonography with graded compression, as well as color Doppler and duplex ultrasound were performed to evaluate the lower extremity deep venous systems from the level of the common  femoral vein and including the common femoral, femoral, profunda femoral, popliteal and calf veins including the posterior tibial, peroneal and gastrocnemius veins when visible. The superficial great saphenous vein was also interrogated. Spectral Doppler was utilized to evaluate flow at rest and with distal augmentation maneuvers in the common femoral, femoral and popliteal veins. COMPARISON:  None. FINDINGS: RIGHT LOWER EXTREMITY Common Femoral Vein: No evidence of thrombus. Normal compressibility, respiratory phasicity and response to augmentation. Saphenofemoral Junction: No evidence of thrombus. Normal compressibility and flow on color Doppler imaging. Profunda Femoral Vein: No evidence of thrombus. Normal compressibility and flow on color Doppler imaging. Femoral Vein: No evidence of thrombus. Normal compressibility, respiratory phasicity and response to augmentation. Popliteal Vein: No evidence of thrombus. Normal compressibility, respiratory phasicity and response to augmentation. Calf Veins: Limited assessment of the calf veins secondary to edema. No significant occlusive thrombus visualized. LEFT LOWER EXTREMITY Common Femoral Vein: No evidence of thrombus. Normal compressibility, respiratory phasicity and response to augmentation. Saphenofemoral Junction: No evidence of thrombus. Normal compressibility and flow on color Doppler imaging. Profunda Femoral Vein: No evidence of thrombus. Normal compressibility and flow on color Doppler imaging. Femoral Vein: No evidence of thrombus. Normal compressibility, respiratory phasicity and response to augmentation. Popliteal Vein: No evidence of thrombus. Normal compressibility, respiratory phasicity and response to augmentation. Calf Veins: Limited assessment of the calf veins because of edema. No significant occlusive thrombus. Other Findings: Popliteal fossa Baker's cyst measures 3.4 x 1.9 x 2.9 cm. IMPRESSION: No evidence of significant DVT in either extremity.  Limited assessment of the calf veins. 3.4 cm left popliteal fossa Baker's cyst. Electronically Signed   By: Jerilynn Mages.  Shick M.D.   On: 09/27/2019 16:32   DG Abd 2 Views  Result Date: 09/27/2019 CLINICAL DATA:  64 year old female with constipation. EXAM: ABDOMEN - 2 VIEW COMPARISON:  CT of the abdomen pelvis dated 02/16/2018. FINDINGS: Large amount of dense stool noted throughout the colon. There is no bowel dilatation or evidence of obstruction. No free air or radiopaque calculi. Right upper quadrant cholecystectomy clips. A single tubal ligation clip noted in the right hemipelvis. The osseous structures and soft tissues are grossly unremarkable. IMPRESSION: Constipation.  No bowel obstruction. Electronically Signed   By: Anner Crete M.D.   On: 09/27/2019 17:24       Subjective: Pt denies of any complaints   Discharge Exam: Vitals:  09/30/19 2136 09/30/19 2313  BP: 138/76 136/75  Pulse: 72 75  Resp: 18 16  Temp: 98.3 F (36.8 C) 97.9 F (36.6 C)  SpO2: 100% 100%   Vitals:   09/30/19 1615 09/30/19 1916 09/30/19 2136 09/30/19 2313  BP: 123/77  138/76 136/75  Pulse: 70  72 75  Resp: 17  18 16   Temp: 98.3 F (36.8 C)  98.3 F (36.8 C) 97.9 F (36.6 C)  TempSrc: Oral  Oral Oral  SpO2: 98% 98% 100% 100%  Weight:      Height:        General: Pt is alert, awake, not in acute distress Cardiovascular:  S1/S2 +, no rubs, no gallops Respiratory: CTA bilaterally, no wheezing, no rales Abdominal: Soft, NT, ND, bowel sounds + Extremities:  no cyanosis    The results of significant diagnostics from this hospitalization (including imaging, microbiology, ancillary and laboratory) are listed below for reference.     Microbiology: Recent Results (from the past 240 hour(s))  Urine Culture     Status: None   Collection Time: 09/27/19  7:00 PM   Specimen: Urine, Random  Result Value Ref Range Status   Specimen Description   Final    URINE, RANDOM Performed at [facility],  [address], [place], [place] [address]    Special Requests   Final    NONE Performed at [facility], [address] [place], [place] [address]    Culture   Final    NO GROWTH Performed at [facility], [place] [address], [place], [place] [address]    Report Status 09/28/2019 FINAL  Final  SARS Coronavirus 2 by RT PCR     Status: None   Collection Time: 09/27/19  9:05 PM  Result Value Ref Range Status   SARS Coronavirus 2 NEGATIVE NEGATIVE Final    Comment: (NOTE) Result indicates the ABSENCE of SARS-CoV-2 RNA in the patient specimen.  The lowest concentration of SARS-CoV-2 viral copies this assay can detect in nasopharyngeal swab specimens is 500 copies / mL.  A negative result does not preclude SARS-CoV-2 infection and should not be used as the sole basis for patient management decisions. A negative result may occur with improper specimen collection / handling, submission of a specimen other than nasopharyngeal swab, presence of viral mutation(s) within the areas targeted by this assay, and inadequate number of viral copies (<500 copies / mL) present.  Negative results must be combined with clinical observations, patient history, and epidemiological information.  The expected result is NEGATIVE.  Patient Fact Sheet:  BlogSelections.co.uk   Provider Fact Sheet:  https://lucas.com/   This test is not yet approved or cleared by the [place] FDA and  has been authorized for  detection and/or diagnosis of SARS-CoV-2 by FDA under an Emergency Use Authorization (EUA).  This EUA will remain in effect (meaning this test can be used) for the duration of  the COVID-19 declaration under Section 564(b)(1) of the Act, 21 U.S.C. section 360bbb-3(b)(1), unless the authorization is terminated or revoked sooner Performed at [facility], [place] [address], [place], [place] [address]      Labs: BNP (last 3  results) Recent Labs    09/27/19 1406  BNP 25.3   Basic Metabolic Panel: Recent Labs  Lab 09/27/19 2242 09/27/19 2242 09/28/19 0439 09/28/19 1446 09/29/19 0601 09/30/19 0604 10/01/19 0527  NA 134*  --  134*  --  139 137 138  K 5.4*   < >  5.6* 5.1 5.2* 5.1 5.1  CL 105  --  107  --  108 107 105  CO2 16*  --  17*  --  22 25 28   GLUCOSE 95  --  150*  --  180* 186* 167*  BUN 99*  --  106*  --  95* 81* 67*  CREATININE 6.91*  --  6.91*  --  5.70* 4.57* 3.68*  CALCIUM 7.8*  --  7.6*  --  7.5* 7.6* 7.8*  PHOS 8.5*  --  8.6*  --   --   --   --    < > = values in this interval not displayed.   Liver Function Tests: Recent Labs  Lab 09/27/19 1406 09/27/19 2242 09/28/19 0439  AST 23  --   --   ALT 26  --   --   ALKPHOS 62  --   --   BILITOT 0.4  --   --   PROT 8.0  --   --   ALBUMIN 4.3 4.0 3.4*   No results for input(s): LIPASE, AMYLASE in the last 168 hours. No results for input(s): AMMONIA in the last 168 hours. CBC: Recent Labs  Lab 09/27/19 1406 09/28/19 0439 09/29/19 0601 09/30/19 0604 10/01/19 0527  WBC 7.9 6.1 5.6 5.7 6.1  NEUTROABS 5.6  --   --   --   --   HGB 9.8* 8.0* 8.5* 7.9* 7.9*  HCT 29.3* 24.0* 24.2* 23.5* 23.1*  MCV 96.1 96.4 93.8 96.3 95.1  PLT 379 311 315 304 291   Cardiac Enzymes: No results for input(s): CKTOTAL, CKMB, CKMBINDEX, TROPONINI in the last 168 hours. BNP: Invalid input(s): POCBNP CBG: Recent Labs  Lab 09/30/19 1158 09/30/19 1659 09/30/19 2113 10/01/19 0736 10/01/19 1123  GLUCAP 298* 259* 240* 145* 166*   D-Dimer No results for input(s): DDIMER in the last 72 hours. Hgb A1c No results for input(s): HGBA1C in the last 72 hours. Lipid Profile No results for input(s): CHOL, HDL, LDLCALC, TRIG, CHOLHDL, LDLDIRECT in the last 72 hours. Thyroid function studies No results for input(s): TSH, T4TOTAL, T3FREE, THYROIDAB in the last 72 hours.  Invalid input(s): FREET3 Anemia work up Recent Labs    10/01/19 0527  FOLATE 3.7*   FERRITIN 725*  TIBC 242*  IRON 101  RETICCTPCT 4.1*   Urinalysis    Component Value Date/Time   COLORURINE YELLOW (A) 09/27/2019 1900   APPEARANCEUR CLOUDY (A) 09/27/2019 1900   LABSPEC 1.012 09/27/2019 1900   PHURINE 5.0 09/27/2019 1900   GLUCOSEU NEGATIVE 09/27/2019 1900   HGBUR NEGATIVE 09/27/2019 1900   [place] NEGATIVE 09/27/2019 1900   KETONESUR NEGATIVE 09/27/2019 1900   PROTEINUR 30 (A) 09/27/2019 1900   NITRITE NEGATIVE 09/27/2019 1900   LEUKOCYTESUR LARGE (A) 09/27/2019 1900   Sepsis Labs Invalid input(s): PROCALCITONIN,  WBC,  LACTICIDVEN Microbiology Recent Results (from the past 240 hour(s))  Urine Culture     Status: None   Collection Time: 09/27/19  7:00 PM   Specimen: Urine, Random  Result Value Ref Range Status   Specimen Description   Final    URINE, RANDOM Performed at [facility], [address] [place], [place] [address]    Special Requests   Final    NONE Performed at [facility], [address] [place], [place] [address]    Culture   Final    NO GROWTH Performed at [facility], [place] [address], [place], [place] [address]    Report  Status 09/28/2019 FINAL  Final  SARS Coronavirus 2 by RT PCR     Status: None   Collection Time: 09/27/19  9:05 PM  Result Value Ref Range Status   SARS Coronavirus 2 NEGATIVE NEGATIVE Final    Comment: (NOTE) Result indicates the ABSENCE of SARS-CoV-2 RNA in the patient specimen.  The lowest concentration of SARS-CoV-2 viral copies this assay can detect in nasopharyngeal swab specimens is 500 copies / mL.  A negative result does not preclude SARS-CoV-2 infection and should not be used as the sole basis for patient management decisions. A negative result may occur with improper specimen collection / handling, submission of a specimen other than nasopharyngeal swab, presence of viral mutation(s) within the areas targeted by this assay, and inadequate number of viral copies (<500  copies / mL) present.  Negative results must be combined with clinical observations, patient history, and epidemiological information.  The expected result is NEGATIVE.  Patient Fact Sheet:  BlogSelections.co.uk   Provider Fact Sheet:  https://lucas.com/   This test is not yet approved or cleared by the [place] FDA and  has been authorized for  detection and/or diagnosis of SARS-CoV-2 by FDA under an Emergency Use Authorization (EUA).  This EUA will remain in effect (meaning this test can be used) for the duration of  the COVID-19 declaration under Section 564(b)(1) of the Act, 21 U.S.C. section 360bbb-3(b)(1), unless the authorization is terminated or revoked sooner Performed at [facility], [place] [address] [place], [place] [address]      Time coordinating discharge: Over 30 minutes  SIGNED:   Wyvonnia Dusky, MD  [facility] 10/01/2019, 1:00 PM Pager   If 7PM-7AM, please contact night-coverage [email or web address]

## 2019-10-05 DIAGNOSIS — F418 Other specified anxiety disorders: Secondary | ICD-10-CM | POA: Diagnosis not present

## 2019-10-05 DIAGNOSIS — F329 Major depressive disorder, single episode, unspecified: Secondary | ICD-10-CM | POA: Diagnosis not present

## 2019-10-05 DIAGNOSIS — I5022 Chronic systolic (congestive) heart failure: Secondary | ICD-10-CM | POA: Diagnosis not present

## 2019-10-05 DIAGNOSIS — I130 Hypertensive heart and chronic kidney disease with heart failure and stage 1 through stage 4 chronic kidney disease, or unspecified chronic kidney disease: Secondary | ICD-10-CM | POA: Diagnosis not present

## 2019-10-05 DIAGNOSIS — N184 Chronic kidney disease, stage 4 (severe): Secondary | ICD-10-CM | POA: Diagnosis not present

## 2019-10-05 DIAGNOSIS — E1122 Type 2 diabetes mellitus with diabetic chronic kidney disease: Secondary | ICD-10-CM | POA: Diagnosis not present

## 2019-10-05 DIAGNOSIS — J45909 Unspecified asthma, uncomplicated: Secondary | ICD-10-CM | POA: Diagnosis not present

## 2019-10-05 DIAGNOSIS — I10 Essential (primary) hypertension: Secondary | ICD-10-CM | POA: Diagnosis not present

## 2019-10-05 DIAGNOSIS — D631 Anemia in chronic kidney disease: Secondary | ICD-10-CM | POA: Diagnosis not present

## 2019-10-05 DIAGNOSIS — E1142 Type 2 diabetes mellitus with diabetic polyneuropathy: Secondary | ICD-10-CM | POA: Diagnosis not present

## 2019-10-06 ENCOUNTER — Other Ambulatory Visit: Payer: Self-pay

## 2019-10-06 ENCOUNTER — Encounter: Payer: Self-pay | Admitting: Internal Medicine

## 2019-10-06 DIAGNOSIS — I5022 Chronic systolic (congestive) heart failure: Secondary | ICD-10-CM | POA: Diagnosis not present

## 2019-10-06 DIAGNOSIS — E1122 Type 2 diabetes mellitus with diabetic chronic kidney disease: Secondary | ICD-10-CM | POA: Diagnosis not present

## 2019-10-06 DIAGNOSIS — C50919 Malignant neoplasm of unspecified site of unspecified female breast: Secondary | ICD-10-CM | POA: Diagnosis not present

## 2019-10-06 DIAGNOSIS — N179 Acute kidney failure, unspecified: Secondary | ICD-10-CM | POA: Diagnosis not present

## 2019-10-06 DIAGNOSIS — Z794 Long term (current) use of insulin: Secondary | ICD-10-CM | POA: Diagnosis not present

## 2019-10-06 DIAGNOSIS — C799 Secondary malignant neoplasm of unspecified site: Secondary | ICD-10-CM | POA: Diagnosis not present

## 2019-10-06 DIAGNOSIS — N184 Chronic kidney disease, stage 4 (severe): Secondary | ICD-10-CM | POA: Diagnosis not present

## 2019-10-06 DIAGNOSIS — Z09 Encounter for follow-up examination after completed treatment for conditions other than malignant neoplasm: Secondary | ICD-10-CM | POA: Diagnosis not present

## 2019-10-07 ENCOUNTER — Inpatient Hospital Stay: Payer: Medicare HMO

## 2019-10-07 ENCOUNTER — Inpatient Hospital Stay (HOSPITAL_BASED_OUTPATIENT_CLINIC_OR_DEPARTMENT_OTHER): Payer: Medicare HMO | Admitting: Internal Medicine

## 2019-10-07 ENCOUNTER — Encounter: Payer: Self-pay | Admitting: Internal Medicine

## 2019-10-07 ENCOUNTER — Other Ambulatory Visit: Payer: Self-pay

## 2019-10-07 ENCOUNTER — Inpatient Hospital Stay: Payer: Medicare HMO | Attending: Internal Medicine

## 2019-10-07 DIAGNOSIS — Z8042 Family history of malignant neoplasm of prostate: Secondary | ICD-10-CM | POA: Insufficient documentation

## 2019-10-07 DIAGNOSIS — Z7189 Other specified counseling: Secondary | ICD-10-CM

## 2019-10-07 DIAGNOSIS — M25562 Pain in left knee: Secondary | ICD-10-CM | POA: Diagnosis not present

## 2019-10-07 DIAGNOSIS — C7951 Secondary malignant neoplasm of bone: Secondary | ICD-10-CM | POA: Diagnosis not present

## 2019-10-07 DIAGNOSIS — R5383 Other fatigue: Secondary | ICD-10-CM | POA: Diagnosis not present

## 2019-10-07 DIAGNOSIS — Z8041 Family history of malignant neoplasm of ovary: Secondary | ICD-10-CM | POA: Diagnosis not present

## 2019-10-07 DIAGNOSIS — I129 Hypertensive chronic kidney disease with stage 1 through stage 4 chronic kidney disease, or unspecified chronic kidney disease: Secondary | ICD-10-CM | POA: Insufficient documentation

## 2019-10-07 DIAGNOSIS — Z923 Personal history of irradiation: Secondary | ICD-10-CM | POA: Diagnosis not present

## 2019-10-07 DIAGNOSIS — D649 Anemia, unspecified: Secondary | ICD-10-CM | POA: Insufficient documentation

## 2019-10-07 DIAGNOSIS — R5381 Other malaise: Secondary | ICD-10-CM | POA: Insufficient documentation

## 2019-10-07 DIAGNOSIS — Z170 Estrogen receptor positive status [ER+]: Secondary | ICD-10-CM | POA: Insufficient documentation

## 2019-10-07 DIAGNOSIS — Z800 Family history of malignant neoplasm of digestive organs: Secondary | ICD-10-CM | POA: Diagnosis not present

## 2019-10-07 DIAGNOSIS — Z87891 Personal history of nicotine dependence: Secondary | ICD-10-CM | POA: Diagnosis not present

## 2019-10-07 DIAGNOSIS — C50212 Malignant neoplasm of upper-inner quadrant of left female breast: Secondary | ICD-10-CM | POA: Insufficient documentation

## 2019-10-07 DIAGNOSIS — F418 Other specified anxiety disorders: Secondary | ICD-10-CM | POA: Diagnosis not present

## 2019-10-07 DIAGNOSIS — Z7982 Long term (current) use of aspirin: Secondary | ICD-10-CM | POA: Insufficient documentation

## 2019-10-07 DIAGNOSIS — Z79899 Other long term (current) drug therapy: Secondary | ICD-10-CM | POA: Insufficient documentation

## 2019-10-07 DIAGNOSIS — Z7951 Long term (current) use of inhaled steroids: Secondary | ICD-10-CM | POA: Diagnosis not present

## 2019-10-07 DIAGNOSIS — Z8673 Personal history of transient ischemic attack (TIA), and cerebral infarction without residual deficits: Secondary | ICD-10-CM | POA: Insufficient documentation

## 2019-10-07 DIAGNOSIS — Z803 Family history of malignant neoplasm of breast: Secondary | ICD-10-CM | POA: Insufficient documentation

## 2019-10-07 DIAGNOSIS — Z5111 Encounter for antineoplastic chemotherapy: Secondary | ICD-10-CM | POA: Insufficient documentation

## 2019-10-07 DIAGNOSIS — N184 Chronic kidney disease, stage 4 (severe): Secondary | ICD-10-CM | POA: Diagnosis not present

## 2019-10-07 DIAGNOSIS — E1122 Type 2 diabetes mellitus with diabetic chronic kidney disease: Secondary | ICD-10-CM | POA: Diagnosis not present

## 2019-10-07 DIAGNOSIS — Z95828 Presence of other vascular implants and grafts: Secondary | ICD-10-CM

## 2019-10-07 DIAGNOSIS — Z794 Long term (current) use of insulin: Secondary | ICD-10-CM | POA: Insufficient documentation

## 2019-10-07 DIAGNOSIS — M25561 Pain in right knee: Secondary | ICD-10-CM | POA: Insufficient documentation

## 2019-10-07 LAB — CBC WITH DIFFERENTIAL/PLATELET
Abs Immature Granulocytes: 0.17 10*3/uL — ABNORMAL HIGH (ref 0.00–0.07)
Basophils Absolute: 0 10*3/uL (ref 0.0–0.1)
Basophils Relative: 0 %
Eosinophils Absolute: 0 10*3/uL (ref 0.0–0.5)
Eosinophils Relative: 0 %
HCT: 26.6 % — ABNORMAL LOW (ref 36.0–46.0)
Hemoglobin: 8.6 g/dL — ABNORMAL LOW (ref 12.0–15.0)
Immature Granulocytes: 2 %
Lymphocytes Relative: 11 %
Lymphs Abs: 1.1 10*3/uL (ref 0.7–4.0)
MCH: 32.3 pg (ref 26.0–34.0)
MCHC: 32.3 g/dL (ref 30.0–36.0)
MCV: 100 fL (ref 80.0–100.0)
Monocytes Absolute: 1.1 10*3/uL — ABNORMAL HIGH (ref 0.1–1.0)
Monocytes Relative: 11 %
Neutro Abs: 8 10*3/uL — ABNORMAL HIGH (ref 1.7–7.7)
Neutrophils Relative %: 76 %
Platelets: 299 10*3/uL (ref 150–400)
RBC: 2.66 MIL/uL — ABNORMAL LOW (ref 3.87–5.11)
RDW: 14.1 % (ref 11.5–15.5)
WBC: 10.5 10*3/uL (ref 4.0–10.5)
nRBC: 0 % (ref 0.0–0.2)

## 2019-10-07 LAB — COMPREHENSIVE METABOLIC PANEL
ALT: 45 U/L — ABNORMAL HIGH (ref 0–44)
AST: 26 U/L (ref 15–41)
Albumin: 3.7 g/dL (ref 3.5–5.0)
Alkaline Phosphatase: 55 U/L (ref 38–126)
Anion gap: 8 (ref 5–15)
BUN: 55 mg/dL — ABNORMAL HIGH (ref 8–23)
CO2: 23 mmol/L (ref 22–32)
Calcium: 8.9 mg/dL (ref 8.9–10.3)
Chloride: 104 mmol/L (ref 98–111)
Creatinine, Ser: 3.11 mg/dL — ABNORMAL HIGH (ref 0.44–1.00)
GFR calc Af Amer: 18 mL/min — ABNORMAL LOW (ref >60)
GFR calc non Af Amer: 15 mL/min — ABNORMAL LOW (ref >60)
Glucose, Bld: 64 mg/dL — ABNORMAL LOW (ref 70–99)
Potassium: 4.5 mmol/L (ref 3.5–5.1)
Sodium: 135 mmol/L (ref 135–145)
Total Bilirubin: 0.4 mg/dL (ref 0.3–1.2)
Total Protein: 6.9 g/dL (ref 6.5–8.1)

## 2019-10-07 MED ORDER — SODIUM CHLORIDE 0.9 % IV SOLN
2.0000 mg | Freq: Once | INTRAVENOUS | Status: AC
  Administered 2019-10-07: 2 mg via INTRAVENOUS
  Filled 2019-10-07: qty 4

## 2019-10-07 MED ORDER — HEPARIN SOD (PORK) LOCK FLUSH 100 UNIT/ML IV SOLN
INTRAVENOUS | Status: AC
  Filled 2019-10-07: qty 5

## 2019-10-07 MED ORDER — SODIUM CHLORIDE 0.9 % IV SOLN
Freq: Once | INTRAVENOUS | Status: AC
  Filled 2019-10-07: qty 250

## 2019-10-07 MED ORDER — SODIUM CHLORIDE 0.9% FLUSH
10.0000 mL | Freq: Once | INTRAVENOUS | Status: AC
  Administered 2019-10-07: 10 mL via INTRAVENOUS
  Filled 2019-10-07: qty 10

## 2019-10-07 MED ORDER — HEPARIN SOD (PORK) LOCK FLUSH 100 UNIT/ML IV SOLN
500.0000 [IU] | Freq: Once | INTRAVENOUS | Status: AC | PRN
  Administered 2019-10-07: 500 [IU]
  Filled 2019-10-07: qty 5

## 2019-10-07 MED ORDER — PROCHLORPERAZINE MALEATE 10 MG PO TABS
10.0000 mg | ORAL_TABLET | Freq: Once | ORAL | Status: AC
  Administered 2019-10-07: 10 mg via ORAL
  Filled 2019-10-07: qty 1

## 2019-10-07 NOTE — Progress Notes (Signed)
[place] OFFICE PROGRESS NOTE  Patient Care Team: [name], [name], MD as Medical Oncologist (Medical Oncology) Corey Skains, MD as Consulting Physician (Cardiology)  Cancer Staging No matching staging information was found for the patient.   Oncology History Overview Note  # OCT 2015-STAGE IV LEFT BREAST T2N1 [T=4cm; [name]; [name]; [name]; EBUS- Positive Paratrac/subcarinal LN s/p ? Taxotere [in [place]; [name]; [name];[name]; [name]; suv 3]; [name]; [name]; [name];  [name]; Ant R iliac bone; [name]; [name]; START-Taxol weekly. March 2020- Taxol every 2 weeks [PN]; SEP 2020- PET progression  # SEP 04/29/2019- ERIBULIN s/p RT - Right hip- [s/p RT- NOV 2020]  # Poorly controlled Blood sugars- improved.   # Pancreatitis Hx/PEI- on creon in past / CKD IV [creat ~ 3-4; [name]; [name]; mild left sided weakness]  # Jan 2020-  Lobular lesion on tongue- s/p excision pyogenic granuloma [Dr.McQueen]   # GENETIC TESTING/COUNSELLING: HETEROZYGOUS Cystic Fibrosis Gene [explains hx of recurrent pancreatitis]  # MOLECULAR TESTING: NA   # PALLIATIVE CARE: 1/22-Discussed/Declined ------------------------------------------------   DIAGNOSIS: [ 2015] BREAST CA; [name]; [name] ;GOALS: Palliative  CURRENT/MOST RECENT THERAPY: ERIBULIN [C].     Carcinoma of upper-inner quadrant of left  breast in female, estrogen receptor positive ([place])  04/29/2019 -  Chemotherapy   The patient had eriBULin mesylate (HALAVEN) 2 mg in sodium chloride 0.9 % 100 mL chemo infusion, 2 mg, Intravenous,  Once, 7 of 9 cycles Dose modification: 1 mg/m2 (original dose 1 mg/m2, Cycle 1, Reason: Provider Judgment) Administration: 2 mg (06/03/2019), 2 mg (04/29/2019), 2 mg (06/10/2019), 2 mg (07/04/2019), 2 mg (07/11/2019), 2 mg (07/25/2019), 2 mg (08/03/2019), 2 mg (08/19/2019), 2 mg (08/26/2019), 2 mg (09/09/2019), 2 mg (09/16/2019), 2 mg (10/07/2019)  for chemotherapy treatment.     INTERVAL HISTORY:  [name] 64 y.o.  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  In the interim approximately 1 week ago patient was admitted to hospital for worsening renal failure-/weight gain-improved on diuretics.  Did not need dialysis.  Lower extremity Dopplers negative.  Patient denies any worsening shortness of breath or cough.  Continues to have chronic mild joint pains not any worse.  Denies any worsening tingling numbness.  Review of Systems  Constitutional: Positive for malaise/fatigue. Negative for chills, diaphoresis, [name], hemoptysis, sputum production, [name], palpitations and orthopnea.  Gastrointestinal: Negative for abdominal pain, blood in stool, constipation, diarrhea, heartburn, melena, nausea and vomiting.  Genitourinary: Negative for dysuria, frequency and urgency.  Musculoskeletal: Negative for back pain and joint pain.  Skin: Negative.  Negative for itching and rash.  Neurological: Positive for tingling. Negative for dizziness, focal weakness, weakness  and headaches.   Endo/Heme/Allergies: Does not bruise/bleed easily.  Psychiatric/Behavioral: Negative for depression. The patient is not nervous/anxious and does not have insomnia.     PAST MEDICAL HISTORY :  Past Medical History:  Diagnosis Date  . Anemia   . Anxiety   . Asthma   . Cancer ([place]) 03/10/2018   Per [facility] PET order. Carcinoma of upper-inner quadrant of left breast in female, estrogen receptor positive .  Marland Kitchen Cancer (HCC)    LUNG  . CHF (congestive heart failure) ([place]) 1997  . CKD (chronic kidney disease)   . Depression   . Diabetes mellitus, type 2 ([place])   . Family history of breast cancer   . Family history of colon cancer   . Family history of ovarian cancer   . Family history of pancreatic cancer   . Family history of prostate cancer   . Family history of stomach cancer   . GERD (gastroesophageal reflux disease)    history of an ulcer  . Hair loss   . History of left breast cancer 05/29/14  . History of partial hysterectomy 12/31/2016   Per patient.  Has not had a period in years.  Had a partial hysterectomy years ago.  Marland Kitchen Hypertension   . Mitral valve regurgitation   . Neuromuscular disorder (HCC)    neuropathies in hand  . Obesity   . Pancreatitis 1997  . Stroke [facility]) 2010   with mild left arm weakness    PAST SURGICAL HISTORY :   Past Surgical History:  Procedure Laterality Date  . CATARACT EXTRACTION W/PHACO Right 02/24/2019   Procedure: CATARACT EXTRACTION PHACO AND INTRAOCULAR LENS PLACEMENT ([place]) RIGHT DIABETES;  Surgeon: Marchia Meiers, MD;  Location: [facility] ORS;  Service: Ophthalmology;  Laterality: Right;  [place] 01:13.0 CDE 7.96 Fluid Pack Lot # [id number]  . CATARACT EXTRACTION W/PHACO Left 03/24/2019   Procedure: CATARACT EXTRACTION PHACO AND INTRAOCULAR LENS PLACEMENT ([facility]) - left diabetic;  Surgeon: Marchia Meiers, MD;  Location: [facility] ORS;  Service: Ophthalmology;  Laterality: Left;  [place]  01:36 CDE 13.93 Fluid pack lot # [id number]  . CESAREAN SECTION    .  CHOLECYSTECTOMY    . EXCISION OF TONGUE LESION N/A 08/17/2018   Procedure: EXCISION OF TONGUE LESION WITH FROZEN SECTIONS;  Surgeon: Beverly Gust, MD;  Location: [facility] ORS;  Service: ENT;  Laterality: N/A;  . EYE SURGERY Right    cataract extraction  . PARTIAL HYSTERECTOMY  12/31/2016   Per patient, she has not had a period in years since she had a partial hysterectomy.  Marland Kitchen PORTA CATH INSERTION    . TUBAL LIGATION      FAMILY HISTORY :   Family History  Problem Relation Age of Onset  . Ovarian cancer Mother 31  . Diabetes Mother   . Hypertension Mother   . COPD Father   . Hypertension Father   . Colon cancer Father 69  . Diabetes Sister   . Breast cancer Sister 38       bilateral  . Diabetes Brother   . Leukemia Maternal Aunt   . Pancreatic cancer Paternal Aunt 61  . Pancreatic cancer Paternal Uncle   . Colon cancer Paternal Uncle   . Stomach cancer Maternal Grandfather 31  . Throat cancer Paternal Grandmother   . Breast cancer Maternal Aunt 80  . Colon cancer Maternal Aunt   . Bone cancer Maternal Aunt   . Breast cancer Paternal Aunt        dx >50  .  Prostate cancer Paternal Uncle   . Pancreatic cancer Paternal Uncle   . Throat cancer Paternal Uncle   . Lung cancer Paternal Uncle   . Stomach cancer Paternal Uncle   . Brain cancer Paternal Aunt   . Cancer Cousin        liver, kidney  . Prostate cancer Cousin        meastatic  . Lung cancer Other     SOCIAL HISTORY:   Social History   Tobacco Use  . Smoking status: Former Smoker    Packs/day: 0.50    Years: 1.00    Pack years: 0.50    Types: Cigarettes  . Smokeless tobacco: Never Used  Substance Use Topics  . Alcohol use: No    Alcohol/week: 0.0 standard drinks  . Drug use: No    ALLERGIES:  is allergic to fish-derived products and sulfamethoxazole-trimethoprim.  MEDICATIONS:  Current Outpatient Medications  Medication Sig Dispense Refill  . acetaminophen-codeine (TYLENOL #4) 300-60 MG tablet Take  1 tablet by mouth 2 (two) times daily as needed for moderate pain.     Marland Kitchen albuterol (PROAIR HFA) 108 (90 BASE) MCG/ACT inhaler Inhale 2 puffs into the lungs every 6 (six) hours as needed for wheezing or shortness of breath.     Marland Kitchen albuterol (PROVENTIL) (2.5 MG/3ML) 0.083% nebulizer solution Inhale 2.5 mg into the lungs every 6 (six) hours as needed for shortness of breath.     . ALPRAZolam (XANAX) 0.5 MG tablet Take 0.5 mg by mouth 2 (two) times daily as needed for anxiety or sleep.     Marland Kitchen amLODipine (NORVASC) 10 MG tablet Take 10 mg by mouth daily.     Marland Kitchen aspirin EC 81 MG tablet Take 81 mg by mouth daily.     Marland Kitchen atenolol (TENORMIN) 100 MG tablet Take 100 mg by mouth 2 (two) times daily.     . bumetanide (BUMEX) 0.5 MG tablet Take 0.5 mg by mouth 2 (two) times daily.     . calcitRIOL (ROCALTROL) 0.25 MCG capsule Take 0.25 mcg by mouth daily.     . Cinnamon 500 MG capsule Take 500 mg by mouth 2 (two) times daily.     . cloNIDine (CATAPRES) 0.2 MG tablet Take 0.2 mg by mouth 2 (two) times daily.     . diflorasone (PSORCON) 0.05 % ointment Apply 1 application topically as directed.     Marland Kitchen ELDERBERRY PO Take 1 tablet by mouth daily.    . enalapril (VASOTEC) 10 MG tablet Take 20 mg by mouth 2 (two) times a day.     . famotidine (PEPCID) 20 MG tablet Take 20 mg by mouth 2 (two) times daily.     . ferrous sulfate 325 (65 FE) MG tablet Take 325 mg by mouth 2 (two) times daily with a meal.    . FLUoxetine (PROZAC) 20 MG capsule Take 20 mg by mouth 2 (two) times daily.     . Fluticasone Propionate, Inhal, (FLOVENT DISKUS) 100 MCG/BLIST AEPB Inhale 2 puffs into the lungs 2 (two) times daily as needed (respiratory problems).     . gabapentin (NEURONTIN) 100 MG capsule TAKE 1 CAPSULE(100 MG) BY MOUTH AT BEDTIME (Patient taking differently: Take 100 mg by mouth at bedtime. ) 30 capsule 6  . glyBURIDE (DIABETA) 5 MG tablet Take 10 mg by mouth 2 (two) times daily with a meal.     . LEVEMIR FLEXTOUCH 100 UNIT/ML Pen  Inject 55 Units into the skin daily.     Marland Kitchen  loperamide (IMODIUM A-D) 2 MG capsule Take 2-4 mg by mouth 4 (four) times daily as needed for diarrhea or loose stools.     Marland Kitchen NOVOLOG FLEXPEN 100 UNIT/ML FlexPen Inject 7 Units into the skin 2 (two) times daily.     Marland Kitchen oxyCODONE-acetaminophen (PERCOCET/ROXICET) 5-325 MG tablet Take 1 tablet by mouth daily as needed for severe pain.    . simvastatin (ZOCOR) 20 MG tablet Take 20 mg by mouth every evening.     . vitamin B-12 (CYANOCOBALAMIN) 1000 MCG tablet Take 1,000 mcg by mouth daily.     No current facility-administered medications for this visit.   Facility-Administered Medications Ordered in Other Visits  Medication Dose Route Frequency Provider Last Rate Last Admin  . sodium chloride flush (NS) 0.9 % injection 10 mL  10 mL Intravenous PRN Cammie Sickle, MD   10 mL at 01/30/16 1054    PHYSICAL EXAMINATION: ECOG PERFORMANCE STATUS: 1 - Symptomatic but completely ambulatory  BP (!) 160/94 (BP Location: Right Arm, Patient Position: Sitting, Cuff Size: Normal)   Pulse 72   Temp (!) 96.6 F (35.9 C) (Tympanic)   Resp 20   Wt 195 lb 8 oz (88.7 kg)   BMI 34.63 kg/m   Filed Weights   10/07/19 0837  Weight: 195 lb 8 oz (88.7 kg)    Physical Exam  Constitutional: She is oriented to person, place, and time and well-developed, well-nourished, and in no distress.  She is alone.  HENT:  Head: Normocephalic and atraumatic.  Mouth/Throat: Oropharynx is clear and moist. No oropharyngeal exudate.  Eyes: Pupils are equal, round, and reactive to light.  Cardiovascular: Normal rate and regular rhythm.  Pulmonary/Chest: Breath sounds normal. No respiratory distress. She has no wheezes.  Abdominal: Soft. Bowel sounds are normal. She exhibits no distension and no mass. There is no abdominal tenderness. There is no rebound and no guarding.  Musculoskeletal:        General: Edema present. No tenderness. Normal range of motion.     Cervical back:  Normal range of motion and neck supple.  Neurological: She is alert and oriented to person, place, and time.  Skin: Skin is warm.  Right thumb bandaged-wound healing.  No signs of infection or pus.  Psychiatric: Affect normal.    LABORATORY DATA:  I have reviewed the data as listed    Component Value Date/Time   NA 135 10/07/2019 0759   NA 130 (L) 06/06/2014 1102   K 4.5 10/07/2019 0759   K 3.9 06/06/2014 1102   CL 104 10/07/2019 0759   CL 95 (L) 06/06/2014 1102   CO2 23 10/07/2019 0759   CO2 28 06/06/2014 1102   GLUCOSE 64 (L) 10/07/2019 0759   GLUCOSE 349 (H) 06/06/2014 1102   BUN 55 (H) 10/07/2019 0759   BUN 17 06/06/2014 1102   CREATININE 3.11 (H) 10/07/2019 0759   CREATININE 1.63 (H) 06/06/2014 1102   CALCIUM 8.9 10/07/2019 0759   CALCIUM 9.2 06/06/2014 1102   PROT 6.9 10/07/2019 0759   PROT 8.2 06/06/2014 1102   ALBUMIN 3.7 10/07/2019 0759   ALBUMIN 3.3 (L) 06/06/2014 1102   AST 26 10/07/2019 0759   AST 7 (L) 06/06/2014 1102   ALT 45 (H) 10/07/2019 0759   ALT 12 (L) 06/06/2014 1102   ALKPHOS 55 10/07/2019 0759   ALKPHOS 74 06/06/2014 1102   BILITOT 0.4 10/07/2019 0759   BILITOT 0.4 06/06/2014 1102   GFRNONAA 15 (L) 10/07/2019 0759   GFRNONAA 35 (L)  06/06/2014 1102   GFRAA 18 (L) 10/07/2019 0759   GFRAA 42 (L) 06/06/2014 1102    No results found for: SPEP, UPEP  Lab Results  Component Value Date   WBC 10.5 10/07/2019   NEUTROABS 8.0 (H) 10/07/2019   HGB 8.6 (L) 10/07/2019   HCT 26.6 (L) 10/07/2019   MCV 100.0 10/07/2019   PLT 299 10/07/2019      Chemistry      Component Value Date/Time   NA 135 10/07/2019 0759   NA 130 (L) 06/06/2014 1102   K 4.5 10/07/2019 0759   K 3.9 06/06/2014 1102   CL 104 10/07/2019 0759   CL 95 (L) 06/06/2014 1102   CO2 23 10/07/2019 0759   CO2 28 06/06/2014 1102   BUN 55 (H) 10/07/2019 0759   BUN 17 06/06/2014 1102   CREATININE 3.11 (H) 10/07/2019 0759   CREATININE 1.63 (H) 06/06/2014 1102      Component Value  Date/Time   CALCIUM 8.9 10/07/2019 0759   CALCIUM 9.2 06/06/2014 1102   ALKPHOS 55 10/07/2019 0759   ALKPHOS 74 06/06/2014 1102   AST 26 10/07/2019 0759   AST 7 (L) 06/06/2014 1102   ALT 45 (H) 10/07/2019 0759   ALT 12 (L) 06/06/2014 1102   BILITOT 0.4 10/07/2019 0759   BILITOT 0.4 06/06/2014 1102       RADIOGRAPHIC STUDIES: I have personally reviewed the radiological images as listed and agreed with the findings in the report. No results found.   ASSESSMENT & PLAN:  Carcinoma of upper-inner quadrant of left breast in female, estrogen receptor positive ([place]) Left breast cancer- stage IV- ER/PR +, HER-2/neu. Currently on eribulin [renally dosed [start at 1 mg/m] started on-04/29/2019.   PET scan JAN 5th, 2020-Partial response/ uptake pleural-based lesion/lung lesions/left breast mass. STABLE; but tumor marker rising.   #Proceed with eribulin.cycle number 7-day 1  Labs today reviewed;  acceptable for treatment today.  Patient tolerating treatment well except for the recent acute renal failure on CKD see below.  # Bone mets-sclerotic; Right acetabular uptake-s/p radiation. Hypocalcemia-ca-8.3/low vitD- on vit D daily.  Stable.  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  Patient did not need dialysis.  Holding off dialysis; awaiting evaluation with nephrology on 3/08.   # Anemia- hemoglobin today-8.3; STABLE;  On PO iron.  # DISPOSITION:  #  ERIBULIN today # 1 week- labs- cbc/bmp-Eribulin # Follow up in 3 weeks; /MD-labs- [id number]; Eribulin; - Dr.B   Orders Placed This Encounter  Procedures  . CBC with Differential    Standing Status:   Future    Standing Expiration Date:   10/06/2020  . Basic metabolic panel    Standing Status:   Future    Standing Expiration Date:   10/06/2020  . CBC with Differential    Standing Status:   Future    Standing Expiration Date:   10/06/2020  . Comprehensive metabolic  panel    Standing Status:   Future    Standing Expiration Date:   10/06/2020  . Cancer antigen 27.29    Standing Status:   Future    Standing Expiration Date:   10/06/2020   All questions were answered. The patient knows to call the clinic with any problems, questions or concerns.      Cammie Sickle, MD 10/10/2019 8:37 AM

## 2019-10-07 NOTE — Assessment & Plan Note (Addendum)
Left breast cancer- stage IV- ER/PR +, HER-2/neu. Currently on eribulin [renally dosed [start at 1 mg/m] started on-04/29/2019.   PET scan JAN 5th, 2020-Partial response/ uptake pleural-based lesion/lung lesions/left breast mass. STABLE; but tumor marker rising.   #Proceed with eribulin.cycle number 7-day 1  Labs today reviewed;  acceptable for treatment today.  Patient tolerating treatment well except for the recent acute renal failure on CKD see below.  # Bone mets-sclerotic; Right acetabular uptake-s/p radiation. Hypocalcemia-ca-8.3/low vitD- on vit D daily.  Stable.  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  Patient did not need dialysis.  Holding off dialysis; awaiting evaluation with nephrology on 3/08.   # Anemia- hemoglobin today-8.3; STABLE;  On PO iron.  # DISPOSITION:  #  ERIBULIN today # 1 week- labs- cbc/bmp-Eribulin # Follow up in 3 weeks; /MD-labs- [id number]; Eribulin; - Dr.B

## 2019-10-08 LAB — CANCER ANTIGEN 27.29: CA 27.29: 94.3 U/mL — ABNORMAL HIGH (ref 0.0–38.6)

## 2019-10-10 DIAGNOSIS — I120 Hypertensive chronic kidney disease with stage 5 chronic kidney disease or end stage renal disease: Secondary | ICD-10-CM | POA: Diagnosis not present

## 2019-10-10 DIAGNOSIS — E1122 Type 2 diabetes mellitus with diabetic chronic kidney disease: Secondary | ICD-10-CM | POA: Diagnosis not present

## 2019-10-10 DIAGNOSIS — E871 Hypo-osmolality and hyponatremia: Secondary | ICD-10-CM | POA: Diagnosis not present

## 2019-10-10 DIAGNOSIS — R809 Proteinuria, unspecified: Secondary | ICD-10-CM | POA: Diagnosis not present

## 2019-10-10 DIAGNOSIS — N185 Chronic kidney disease, stage 5: Secondary | ICD-10-CM | POA: Diagnosis not present

## 2019-10-10 DIAGNOSIS — D631 Anemia in chronic kidney disease: Secondary | ICD-10-CM | POA: Diagnosis not present

## 2019-10-10 DIAGNOSIS — N178 Other acute kidney failure: Secondary | ICD-10-CM | POA: Diagnosis not present

## 2019-10-10 DIAGNOSIS — N2581 Secondary hyperparathyroidism of renal origin: Secondary | ICD-10-CM | POA: Diagnosis not present

## 2019-10-11 DIAGNOSIS — N184 Chronic kidney disease, stage 4 (severe): Secondary | ICD-10-CM | POA: Diagnosis not present

## 2019-10-11 DIAGNOSIS — I5022 Chronic systolic (congestive) heart failure: Secondary | ICD-10-CM | POA: Diagnosis not present

## 2019-10-11 DIAGNOSIS — F329 Major depressive disorder, single episode, unspecified: Secondary | ICD-10-CM | POA: Diagnosis not present

## 2019-10-11 DIAGNOSIS — E1122 Type 2 diabetes mellitus with diabetic chronic kidney disease: Secondary | ICD-10-CM | POA: Diagnosis not present

## 2019-10-11 DIAGNOSIS — I130 Hypertensive heart and chronic kidney disease with heart failure and stage 1 through stage 4 chronic kidney disease, or unspecified chronic kidney disease: Secondary | ICD-10-CM | POA: Diagnosis not present

## 2019-10-11 DIAGNOSIS — J45909 Unspecified asthma, uncomplicated: Secondary | ICD-10-CM | POA: Diagnosis not present

## 2019-10-11 DIAGNOSIS — E1142 Type 2 diabetes mellitus with diabetic polyneuropathy: Secondary | ICD-10-CM | POA: Diagnosis not present

## 2019-10-11 DIAGNOSIS — D631 Anemia in chronic kidney disease: Secondary | ICD-10-CM | POA: Diagnosis not present

## 2019-10-11 DIAGNOSIS — F418 Other specified anxiety disorders: Secondary | ICD-10-CM | POA: Diagnosis not present

## 2019-10-12 DIAGNOSIS — F329 Major depressive disorder, single episode, unspecified: Secondary | ICD-10-CM | POA: Diagnosis not present

## 2019-10-12 DIAGNOSIS — J45909 Unspecified asthma, uncomplicated: Secondary | ICD-10-CM | POA: Diagnosis not present

## 2019-10-12 DIAGNOSIS — I130 Hypertensive heart and chronic kidney disease with heart failure and stage 1 through stage 4 chronic kidney disease, or unspecified chronic kidney disease: Secondary | ICD-10-CM | POA: Diagnosis not present

## 2019-10-12 DIAGNOSIS — E1142 Type 2 diabetes mellitus with diabetic polyneuropathy: Secondary | ICD-10-CM | POA: Diagnosis not present

## 2019-10-12 DIAGNOSIS — I5022 Chronic systolic (congestive) heart failure: Secondary | ICD-10-CM | POA: Diagnosis not present

## 2019-10-12 DIAGNOSIS — D631 Anemia in chronic kidney disease: Secondary | ICD-10-CM | POA: Diagnosis not present

## 2019-10-12 DIAGNOSIS — E1122 Type 2 diabetes mellitus with diabetic chronic kidney disease: Secondary | ICD-10-CM | POA: Diagnosis not present

## 2019-10-12 DIAGNOSIS — F418 Other specified anxiety disorders: Secondary | ICD-10-CM | POA: Diagnosis not present

## 2019-10-12 DIAGNOSIS — N184 Chronic kidney disease, stage 4 (severe): Secondary | ICD-10-CM | POA: Diagnosis not present

## 2019-10-14 ENCOUNTER — Inpatient Hospital Stay: Payer: Medicare HMO

## 2019-10-14 ENCOUNTER — Other Ambulatory Visit: Payer: Self-pay

## 2019-10-14 VITALS — BP 128/75 | HR 66 | Temp 96.0°F | Resp 20 | Wt 194.0 lb

## 2019-10-14 DIAGNOSIS — C50212 Malignant neoplasm of upper-inner quadrant of left female breast: Secondary | ICD-10-CM

## 2019-10-14 DIAGNOSIS — D649 Anemia, unspecified: Secondary | ICD-10-CM | POA: Diagnosis not present

## 2019-10-14 DIAGNOSIS — Z5111 Encounter for antineoplastic chemotherapy: Secondary | ICD-10-CM | POA: Diagnosis not present

## 2019-10-14 DIAGNOSIS — R5381 Other malaise: Secondary | ICD-10-CM | POA: Diagnosis not present

## 2019-10-14 DIAGNOSIS — Z170 Estrogen receptor positive status [ER+]: Secondary | ICD-10-CM

## 2019-10-14 DIAGNOSIS — Z7189 Other specified counseling: Secondary | ICD-10-CM

## 2019-10-14 DIAGNOSIS — Z95828 Presence of other vascular implants and grafts: Secondary | ICD-10-CM

## 2019-10-14 DIAGNOSIS — C7951 Secondary malignant neoplasm of bone: Secondary | ICD-10-CM | POA: Diagnosis not present

## 2019-10-14 DIAGNOSIS — R5383 Other fatigue: Secondary | ICD-10-CM | POA: Diagnosis not present

## 2019-10-14 DIAGNOSIS — Z923 Personal history of irradiation: Secondary | ICD-10-CM | POA: Diagnosis not present

## 2019-10-14 LAB — CBC WITH DIFFERENTIAL/PLATELET
Abs Immature Granulocytes: 0.09 10*3/uL — ABNORMAL HIGH (ref 0.00–0.07)
Basophils Absolute: 0 10*3/uL (ref 0.0–0.1)
Basophils Relative: 1 %
Eosinophils Absolute: 0.1 10*3/uL (ref 0.0–0.5)
Eosinophils Relative: 1 %
HCT: 25.3 % — ABNORMAL LOW (ref 36.0–46.0)
Hemoglobin: 8.5 g/dL — ABNORMAL LOW (ref 12.0–15.0)
Immature Granulocytes: 1 %
Lymphocytes Relative: 13 %
Lymphs Abs: 0.8 10*3/uL (ref 0.7–4.0)
MCH: 32.2 pg (ref 26.0–34.0)
MCHC: 33.6 g/dL (ref 30.0–36.0)
MCV: 95.8 fL (ref 80.0–100.0)
Monocytes Absolute: 0.3 10*3/uL (ref 0.1–1.0)
Monocytes Relative: 4 %
Neutro Abs: 5.1 10*3/uL (ref 1.7–7.7)
Neutrophils Relative %: 80 %
Platelets: 272 10*3/uL (ref 150–400)
RBC: 2.64 MIL/uL — ABNORMAL LOW (ref 3.87–5.11)
RDW: 13.5 % (ref 11.5–15.5)
WBC: 6.4 10*3/uL (ref 4.0–10.5)
nRBC: 0 % (ref 0.0–0.2)

## 2019-10-14 LAB — BASIC METABOLIC PANEL
Anion gap: 10 (ref 5–15)
BUN: 54 mg/dL — ABNORMAL HIGH (ref 8–23)
CO2: 20 mmol/L — ABNORMAL LOW (ref 22–32)
Calcium: 8.6 mg/dL — ABNORMAL LOW (ref 8.9–10.3)
Chloride: 102 mmol/L (ref 98–111)
Creatinine, Ser: 2.99 mg/dL — ABNORMAL HIGH (ref 0.44–1.00)
GFR calc Af Amer: 19 mL/min — ABNORMAL LOW (ref >60)
GFR calc non Af Amer: 16 mL/min — ABNORMAL LOW (ref >60)
Glucose, Bld: 185 mg/dL — ABNORMAL HIGH (ref 70–99)
Potassium: 4.3 mmol/L (ref 3.5–5.1)
Sodium: 132 mmol/L — ABNORMAL LOW (ref 135–145)

## 2019-10-14 MED ORDER — SODIUM CHLORIDE 0.9 % IV SOLN
Freq: Once | INTRAVENOUS | Status: AC
  Filled 2019-10-14: qty 250

## 2019-10-14 MED ORDER — SODIUM CHLORIDE 0.9 % IV SOLN
2.0000 mg | Freq: Once | INTRAVENOUS | Status: AC
  Administered 2019-10-14: 2 mg via INTRAVENOUS
  Filled 2019-10-14: qty 4

## 2019-10-14 MED ORDER — HEPARIN SOD (PORK) LOCK FLUSH 100 UNIT/ML IV SOLN
INTRAVENOUS | Status: AC
  Filled 2019-10-14: qty 5

## 2019-10-14 MED ORDER — HEPARIN SOD (PORK) LOCK FLUSH 100 UNIT/ML IV SOLN
500.0000 [IU] | Freq: Once | INTRAVENOUS | Status: AC | PRN
  Administered 2019-10-14: 500 [IU]
  Filled 2019-10-14: qty 5

## 2019-10-14 MED ORDER — SODIUM CHLORIDE 0.9% FLUSH
10.0000 mL | Freq: Once | INTRAVENOUS | Status: AC
  Administered 2019-10-14: 10 mL via INTRAVENOUS
  Filled 2019-10-14: qty 10

## 2019-10-14 MED ORDER — PROCHLORPERAZINE MALEATE 10 MG PO TABS
10.0000 mg | ORAL_TABLET | Freq: Once | ORAL | Status: AC
  Administered 2019-10-14: 10 mg via ORAL
  Filled 2019-10-14: qty 1

## 2019-10-17 DIAGNOSIS — J45909 Unspecified asthma, uncomplicated: Secondary | ICD-10-CM | POA: Diagnosis not present

## 2019-10-17 DIAGNOSIS — F329 Major depressive disorder, single episode, unspecified: Secondary | ICD-10-CM | POA: Diagnosis not present

## 2019-10-17 DIAGNOSIS — E1122 Type 2 diabetes mellitus with diabetic chronic kidney disease: Secondary | ICD-10-CM | POA: Diagnosis not present

## 2019-10-17 DIAGNOSIS — F418 Other specified anxiety disorders: Secondary | ICD-10-CM | POA: Diagnosis not present

## 2019-10-17 DIAGNOSIS — I130 Hypertensive heart and chronic kidney disease with heart failure and stage 1 through stage 4 chronic kidney disease, or unspecified chronic kidney disease: Secondary | ICD-10-CM | POA: Diagnosis not present

## 2019-10-17 DIAGNOSIS — D631 Anemia in chronic kidney disease: Secondary | ICD-10-CM | POA: Diagnosis not present

## 2019-10-17 DIAGNOSIS — I5022 Chronic systolic (congestive) heart failure: Secondary | ICD-10-CM | POA: Diagnosis not present

## 2019-10-17 DIAGNOSIS — N184 Chronic kidney disease, stage 4 (severe): Secondary | ICD-10-CM | POA: Diagnosis not present

## 2019-10-17 DIAGNOSIS — E1142 Type 2 diabetes mellitus with diabetic polyneuropathy: Secondary | ICD-10-CM | POA: Diagnosis not present

## 2019-10-18 DIAGNOSIS — S86811D Strain of other muscle(s) and tendon(s) at lower leg level, right leg, subsequent encounter: Secondary | ICD-10-CM | POA: Diagnosis not present

## 2019-10-19 DIAGNOSIS — I5022 Chronic systolic (congestive) heart failure: Secondary | ICD-10-CM | POA: Diagnosis not present

## 2019-10-19 DIAGNOSIS — F418 Other specified anxiety disorders: Secondary | ICD-10-CM | POA: Diagnosis not present

## 2019-10-19 DIAGNOSIS — N184 Chronic kidney disease, stage 4 (severe): Secondary | ICD-10-CM | POA: Diagnosis not present

## 2019-10-19 DIAGNOSIS — I130 Hypertensive heart and chronic kidney disease with heart failure and stage 1 through stage 4 chronic kidney disease, or unspecified chronic kidney disease: Secondary | ICD-10-CM | POA: Diagnosis not present

## 2019-10-19 DIAGNOSIS — E1142 Type 2 diabetes mellitus with diabetic polyneuropathy: Secondary | ICD-10-CM | POA: Diagnosis not present

## 2019-10-19 DIAGNOSIS — F329 Major depressive disorder, single episode, unspecified: Secondary | ICD-10-CM | POA: Diagnosis not present

## 2019-10-19 DIAGNOSIS — J45909 Unspecified asthma, uncomplicated: Secondary | ICD-10-CM | POA: Diagnosis not present

## 2019-10-19 DIAGNOSIS — D631 Anemia in chronic kidney disease: Secondary | ICD-10-CM | POA: Diagnosis not present

## 2019-10-19 DIAGNOSIS — E1122 Type 2 diabetes mellitus with diabetic chronic kidney disease: Secondary | ICD-10-CM | POA: Diagnosis not present

## 2019-10-24 DIAGNOSIS — I5022 Chronic systolic (congestive) heart failure: Secondary | ICD-10-CM | POA: Diagnosis not present

## 2019-10-24 DIAGNOSIS — D631 Anemia in chronic kidney disease: Secondary | ICD-10-CM | POA: Diagnosis not present

## 2019-10-24 DIAGNOSIS — J45909 Unspecified asthma, uncomplicated: Secondary | ICD-10-CM | POA: Diagnosis not present

## 2019-10-24 DIAGNOSIS — N184 Chronic kidney disease, stage 4 (severe): Secondary | ICD-10-CM | POA: Diagnosis not present

## 2019-10-24 DIAGNOSIS — E1142 Type 2 diabetes mellitus with diabetic polyneuropathy: Secondary | ICD-10-CM | POA: Diagnosis not present

## 2019-10-24 DIAGNOSIS — I130 Hypertensive heart and chronic kidney disease with heart failure and stage 1 through stage 4 chronic kidney disease, or unspecified chronic kidney disease: Secondary | ICD-10-CM | POA: Diagnosis not present

## 2019-10-24 DIAGNOSIS — F418 Other specified anxiety disorders: Secondary | ICD-10-CM | POA: Diagnosis not present

## 2019-10-24 DIAGNOSIS — E1122 Type 2 diabetes mellitus with diabetic chronic kidney disease: Secondary | ICD-10-CM | POA: Diagnosis not present

## 2019-10-24 DIAGNOSIS — F329 Major depressive disorder, single episode, unspecified: Secondary | ICD-10-CM | POA: Diagnosis not present

## 2019-10-26 DIAGNOSIS — F329 Major depressive disorder, single episode, unspecified: Secondary | ICD-10-CM | POA: Diagnosis not present

## 2019-10-26 DIAGNOSIS — I5022 Chronic systolic (congestive) heart failure: Secondary | ICD-10-CM | POA: Diagnosis not present

## 2019-10-26 DIAGNOSIS — N184 Chronic kidney disease, stage 4 (severe): Secondary | ICD-10-CM | POA: Diagnosis not present

## 2019-10-26 DIAGNOSIS — E1122 Type 2 diabetes mellitus with diabetic chronic kidney disease: Secondary | ICD-10-CM | POA: Diagnosis not present

## 2019-10-26 DIAGNOSIS — E1142 Type 2 diabetes mellitus with diabetic polyneuropathy: Secondary | ICD-10-CM | POA: Diagnosis not present

## 2019-10-26 DIAGNOSIS — F418 Other specified anxiety disorders: Secondary | ICD-10-CM | POA: Diagnosis not present

## 2019-10-26 DIAGNOSIS — J45909 Unspecified asthma, uncomplicated: Secondary | ICD-10-CM | POA: Diagnosis not present

## 2019-10-26 DIAGNOSIS — I130 Hypertensive heart and chronic kidney disease with heart failure and stage 1 through stage 4 chronic kidney disease, or unspecified chronic kidney disease: Secondary | ICD-10-CM | POA: Diagnosis not present

## 2019-10-26 DIAGNOSIS — D631 Anemia in chronic kidney disease: Secondary | ICD-10-CM | POA: Diagnosis not present

## 2019-10-28 ENCOUNTER — Other Ambulatory Visit: Payer: Self-pay

## 2019-10-28 ENCOUNTER — Inpatient Hospital Stay: Payer: Medicare HMO

## 2019-10-28 ENCOUNTER — Inpatient Hospital Stay (HOSPITAL_BASED_OUTPATIENT_CLINIC_OR_DEPARTMENT_OTHER): Payer: Medicare HMO | Admitting: Internal Medicine

## 2019-10-28 DIAGNOSIS — D649 Anemia, unspecified: Secondary | ICD-10-CM | POA: Diagnosis not present

## 2019-10-28 DIAGNOSIS — R5383 Other fatigue: Secondary | ICD-10-CM | POA: Diagnosis not present

## 2019-10-28 DIAGNOSIS — Z170 Estrogen receptor positive status [ER+]: Secondary | ICD-10-CM | POA: Diagnosis not present

## 2019-10-28 DIAGNOSIS — C7951 Secondary malignant neoplasm of bone: Secondary | ICD-10-CM | POA: Diagnosis not present

## 2019-10-28 DIAGNOSIS — R5381 Other malaise: Secondary | ICD-10-CM | POA: Diagnosis not present

## 2019-10-28 DIAGNOSIS — C50212 Malignant neoplasm of upper-inner quadrant of left female breast: Secondary | ICD-10-CM

## 2019-10-28 DIAGNOSIS — Z7189 Other specified counseling: Secondary | ICD-10-CM

## 2019-10-28 DIAGNOSIS — Z923 Personal history of irradiation: Secondary | ICD-10-CM | POA: Diagnosis not present

## 2019-10-28 DIAGNOSIS — Z5111 Encounter for antineoplastic chemotherapy: Secondary | ICD-10-CM | POA: Diagnosis not present

## 2019-10-28 LAB — CBC WITH DIFFERENTIAL/PLATELET
Abs Immature Granulocytes: 0.15 10*3/uL — ABNORMAL HIGH (ref 0.00–0.07)
Basophils Absolute: 0.1 10*3/uL (ref 0.0–0.1)
Basophils Relative: 1 %
Eosinophils Absolute: 0.1 10*3/uL (ref 0.0–0.5)
Eosinophils Relative: 1 %
HCT: 28.5 % — ABNORMAL LOW (ref 36.0–46.0)
Hemoglobin: 9.8 g/dL — ABNORMAL LOW (ref 12.0–15.0)
Immature Granulocytes: 2 %
Lymphocytes Relative: 16 %
Lymphs Abs: 1.2 10*3/uL (ref 0.7–4.0)
MCH: 32.6 pg (ref 26.0–34.0)
MCHC: 34.4 g/dL (ref 30.0–36.0)
MCV: 94.7 fL (ref 80.0–100.0)
Monocytes Absolute: 1.2 10*3/uL — ABNORMAL HIGH (ref 0.1–1.0)
Monocytes Relative: 15 %
Neutro Abs: 5.2 10*3/uL (ref 1.7–7.7)
Neutrophils Relative %: 65 %
Platelets: 320 10*3/uL (ref 150–400)
RBC: 3.01 MIL/uL — ABNORMAL LOW (ref 3.87–5.11)
RDW: 13.5 % (ref 11.5–15.5)
WBC: 7.9 10*3/uL (ref 4.0–10.5)
nRBC: 0 % (ref 0.0–0.2)

## 2019-10-28 LAB — COMPREHENSIVE METABOLIC PANEL
ALT: 16 U/L (ref 0–44)
AST: 20 U/L (ref 15–41)
Albumin: 3.8 g/dL (ref 3.5–5.0)
Alkaline Phosphatase: 61 U/L (ref 38–126)
Anion gap: 9 (ref 5–15)
BUN: 49 mg/dL — ABNORMAL HIGH (ref 8–23)
CO2: 22 mmol/L (ref 22–32)
Calcium: 8.4 mg/dL — ABNORMAL LOW (ref 8.9–10.3)
Chloride: 101 mmol/L (ref 98–111)
Creatinine, Ser: 3.35 mg/dL — ABNORMAL HIGH (ref 0.44–1.00)
GFR calc Af Amer: 16 mL/min — ABNORMAL LOW (ref >60)
GFR calc non Af Amer: 14 mL/min — ABNORMAL LOW (ref >60)
Glucose, Bld: 123 mg/dL — ABNORMAL HIGH (ref 70–99)
Potassium: 4.6 mmol/L (ref 3.5–5.1)
Sodium: 132 mmol/L — ABNORMAL LOW (ref 135–145)
Total Bilirubin: 0.5 mg/dL (ref 0.3–1.2)
Total Protein: 7.1 g/dL (ref 6.5–8.1)

## 2019-10-28 MED ORDER — HEPARIN SOD (PORK) LOCK FLUSH 100 UNIT/ML IV SOLN
INTRAVENOUS | Status: AC
  Filled 2019-10-28: qty 5

## 2019-10-28 MED ORDER — HEPARIN SOD (PORK) LOCK FLUSH 100 UNIT/ML IV SOLN
500.0000 [IU] | Freq: Once | INTRAVENOUS | Status: AC
  Administered 2019-10-28: 500 [IU] via INTRAVENOUS
  Filled 2019-10-28: qty 5

## 2019-10-28 MED ORDER — SODIUM CHLORIDE 0.9 % IV SOLN
Freq: Once | INTRAVENOUS | Status: AC
  Filled 2019-10-28: qty 250

## 2019-10-28 MED ORDER — SODIUM CHLORIDE 0.9 % IV SOLN
2.0000 mg | Freq: Once | INTRAVENOUS | Status: AC
  Administered 2019-10-28: 2 mg via INTRAVENOUS
  Filled 2019-10-28: qty 4

## 2019-10-28 MED ORDER — PROCHLORPERAZINE MALEATE 10 MG PO TABS
10.0000 mg | ORAL_TABLET | Freq: Once | ORAL | Status: AC
  Administered 2019-10-28: 10 mg via ORAL
  Filled 2019-10-28: qty 1

## 2019-10-28 MED ORDER — SODIUM CHLORIDE 0.9% FLUSH
10.0000 mL | Freq: Once | INTRAVENOUS | Status: AC
  Administered 2019-10-28: 10 mL via INTRAVENOUS
  Filled 2019-10-28: qty 10

## 2019-10-28 NOTE — Progress Notes (Signed)
[place] OFFICE PROGRESS NOTE  Patient Care Team: [name], [name], MD as Medical Oncologist (Medical Oncology) Corey Skains, MD as Consulting Physician (Cardiology)  Cancer Staging No matching staging information was found for the patient.   Oncology History Overview Note  # OCT 2015-STAGE IV LEFT BREAST T2N1 [T=4cm; [name]; [name]; [name]; EBUS- Positive Paratrac/subcarinal LN s/p ? Taxotere [in [place]; [name]; [name];[name]; [name]; suv 3]; [name]; [name]; [name];  [name]; Ant R iliac bone; [name]; [name]; START-Taxol weekly. March 2020- Taxol every 2 weeks [PN]; SEP 2020- PET progression  # SEP 04/29/2019- ERIBULIN s/p RT - Right hip- [s/p RT- NOV 2020]  # Poorly controlled Blood sugars- improved.   # Pancreatitis Hx/PEI- on creon in past / CKD IV [creat ~ 3-4; [name]; [name]; mild left sided weakness]  # Jan 2020-  Lobular lesion on tongue- s/p excision pyogenic granuloma [Dr.McQueen]   # GENETIC TESTING/COUNSELLING: HETEROZYGOUS Cystic Fibrosis Gene [explains hx of recurrent pancreatitis]  # MOLECULAR TESTING: NA   # PALLIATIVE CARE: 1/22-Discussed/Declined ------------------------------------------------   DIAGNOSIS: [ 2015] BREAST CA; [name]; [name] ;GOALS: Palliative  CURRENT/MOST RECENT THERAPY: ERIBULIN [C].     Carcinoma of upper-inner quadrant of left  breast in female, estrogen receptor positive ([place])  04/29/2019 -  Chemotherapy   The patient had eriBULin mesylate ([place]) 2 mg in sodium chloride 0.9 % 100 mL chemo infusion, 2 mg, Intravenous,  Once, 8 of 9 cycles Dose modification: 1 mg/m2 (original dose 1 mg/m2, Cycle 1, Reason: Provider Judgment) Administration: 2 mg (06/03/2019), 2 mg (04/29/2019), 2 mg (06/10/2019), 2 mg (07/04/2019), 2 mg (07/11/2019), 2 mg (07/25/2019), 2 mg (08/03/2019), 2 mg (08/19/2019), 2 mg (08/26/2019), 2 mg (09/09/2019), 2 mg (09/16/2019), 2 mg (10/07/2019), 2 mg (10/14/2019), 2 mg (10/28/2019)  for chemotherapy treatment.     INTERVAL HISTORY:  [name] 64 y.o.  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  Patient complains of bilateral knee pain right more than left.  She has been evaluated orthopedics awaiting MRI.  She is currently getting physical therapy.  States her gait is unstable knees buckling.  No falls.  Otherwise denies any worsening shortness of breath or cough.  No worsening swelling in the legs.  No nausea no vomiting no headaches.  No worsening tingling or numbness.  Chronic fatigue.  Patient wants to go to [facility] to visit her family April 10-24.   Review of Systems  Constitutional: Positive for malaise/fatigue. Negative for chills, diaphoresis, [name], hemoptysis, sputum production, [name], palpitations and orthopnea.  Gastrointestinal: Negative for abdominal pain, blood in stool, constipation, diarrhea, heartburn, melena, nausea and vomiting.  Genitourinary: Negative for  dysuria, frequency and urgency.  Musculoskeletal: Positive for back pain and joint pain.  Skin: Negative.   Negative for itching and rash.  Neurological: Positive for tingling. Negative for dizziness, focal weakness, weakness and headaches.  Endo/Heme/Allergies: Does not bruise/bleed easily.  Psychiatric/Behavioral: Negative for depression. The patient is not nervous/anxious and does not have insomnia.     PAST MEDICAL HISTORY :  Past Medical History:  Diagnosis Date  . Anemia   . Anxiety   . Asthma   . Cancer ([place]) 03/10/2018   Per NM PET order. Carcinoma of upper-inner quadrant of left breast in female, estrogen receptor positive .  Marland Kitchen Cancer (HCC)    LUNG  . CHF (congestive heart failure) ([place]) 1997  . CKD (chronic kidney disease)   . Depression   . Diabetes mellitus, type 2 ([place])   . Family history of breast cancer   . Family history of colon cancer   . Family history of ovarian cancer   . Family history of pancreatic cancer   . Family history of prostate cancer   . Family history of stomach cancer   . GERD (gastroesophageal reflux disease)    history of an ulcer  . Hair loss   . History of left breast cancer 05/29/14  . History of partial hysterectomy 12/31/2016   Per patient.  Has not had a period in years.  Had a partial hysterectomy years ago.  Marland Kitchen Hypertension   . Mitral valve regurgitation   . Neuromuscular disorder (HCC)    neuropathies in hand  . Obesity   . Pancreatitis 1997  . Stroke [facility]) 2010   with mild left arm weakness    PAST SURGICAL HISTORY :   Past Surgical History:  Procedure Laterality Date  . CATARACT EXTRACTION W/PHACO Right 02/24/2019   Procedure: CATARACT EXTRACTION PHACO AND INTRAOCULAR LENS PLACEMENT ([place]) RIGHT DIABETES;  Surgeon: Marchia Meiers, MD;  Location: [facility] ORS;  Service: Ophthalmology;  Laterality: Right;  [place] 01:13.0 CDE 7.96 Fluid Pack Lot # [id number]  . CATARACT EXTRACTION W/PHACO Left 03/24/2019   Procedure: CATARACT EXTRACTION PHACO AND INTRAOCULAR LENS PLACEMENT ([facility]) - left diabetic;  Surgeon: Marchia Meiers, MD;  Location: [facility] ORS;   Service: Ophthalmology;  Laterality: Left;  [place]  01:36 CDE 13.93 Fluid pack lot # [id number]  . CESAREAN SECTION    . CHOLECYSTECTOMY    . EXCISION OF TONGUE LESION N/A 08/17/2018   Procedure: EXCISION OF TONGUE LESION WITH FROZEN SECTIONS;  Surgeon: Beverly Gust, MD;  Location: [facility] ORS;  Service: ENT;  Laterality: N/A;  . EYE SURGERY Right    cataract extraction  . PARTIAL HYSTERECTOMY  12/31/2016   Per patient, she has not had a period in years since she had a partial hysterectomy.  Marland Kitchen PORTA CATH INSERTION    . TUBAL LIGATION      FAMILY HISTORY :   Family History  Problem Relation Age of Onset  . Ovarian cancer Mother 10  . Diabetes Mother   . Hypertension Mother   . COPD Father   . Hypertension Father   . Colon cancer Father 6  . Diabetes Sister   . Breast cancer Sister 69       bilateral  . Diabetes Brother   . Leukemia Maternal Aunt   . Pancreatic cancer Paternal Aunt 54  . Pancreatic cancer Paternal Uncle   . Colon cancer Paternal Uncle   . Stomach cancer Maternal Grandfather 52  . Throat cancer Paternal Grandmother   . Breast cancer  Maternal Aunt 80  . Colon cancer Maternal Aunt   . Bone cancer Maternal Aunt   . Breast cancer Paternal Aunt        dx >50  . Prostate cancer Paternal Uncle   . Pancreatic cancer Paternal Uncle   . Throat cancer Paternal Uncle   . Lung cancer Paternal Uncle   . Stomach cancer Paternal Uncle   . Brain cancer Paternal Aunt   . Cancer Cousin        liver, kidney  . Prostate cancer Cousin        meastatic  . Lung cancer Other     SOCIAL HISTORY:   Social History   Tobacco Use  . Smoking status: Former Smoker    Packs/day: 0.50    Years: 1.00    Pack years: 0.50    Types: Cigarettes  . Smokeless tobacco: Never Used  Substance Use Topics  . Alcohol use: No    Alcohol/week: 0.0 standard drinks  . Drug use: No    ALLERGIES:  is allergic to fish-derived products and sulfamethoxazole-trimethoprim.  MEDICATIONS:   Current Outpatient Medications  Medication Sig Dispense Refill  . acetaminophen-codeine (TYLENOL #4) 300-60 MG tablet Take 1 tablet by mouth 2 (two) times daily as needed for moderate pain.     Marland Kitchen albuterol (PROAIR HFA) 108 (90 BASE) MCG/ACT inhaler Inhale 2 puffs into the lungs every 6 (six) hours as needed for wheezing or shortness of breath.     Marland Kitchen albuterol (PROVENTIL) (2.5 MG/3ML) 0.083% nebulizer solution Inhale 2.5 mg into the lungs every 6 (six) hours as needed for shortness of breath.     . ALPRAZolam (XANAX) 0.5 MG tablet Take 0.5 mg by mouth 2 (two) times daily as needed for anxiety or sleep.     Marland Kitchen amLODipine (NORVASC) 10 MG tablet Take 10 mg by mouth daily.     Marland Kitchen aspirin EC 81 MG tablet Take 81 mg by mouth daily.     Marland Kitchen atenolol (TENORMIN) 100 MG tablet Take 100 mg by mouth 2 (two) times daily.     . bumetanide (BUMEX) 0.5 MG tablet Take 0.5 mg by mouth 2 (two) times daily.     . calcitRIOL (ROCALTROL) 0.25 MCG capsule Take 0.25 mcg by mouth daily.     . Cinnamon 500 MG capsule Take 500 mg by mouth 2 (two) times daily.     . cloNIDine (CATAPRES) 0.2 MG tablet Take 0.2 mg by mouth 2 (two) times daily.     . diflorasone (PSORCON) 0.05 % ointment Apply 1 application topically as directed.     Marland Kitchen ELDERBERRY PO Take 1 tablet by mouth daily.    . enalapril (VASOTEC) 10 MG tablet Take 20 mg by mouth 2 (two) times a day.     . famotidine (PEPCID) 20 MG tablet Take 20 mg by mouth 2 (two) times daily.     . ferrous sulfate 325 (65 FE) MG tablet Take 325 mg by mouth 2 (two) times daily with a meal.    . FLUoxetine (PROZAC) 20 MG capsule Take 20 mg by mouth 2 (two) times daily.     . Fluticasone Propionate, Inhal, (FLOVENT DISKUS) 100 MCG/BLIST AEPB Inhale 2 puffs into the lungs 2 (two) times daily as needed (respiratory problems).     . gabapentin (NEURONTIN) 100 MG capsule TAKE 1 CAPSULE(100 MG) BY MOUTH AT BEDTIME (Patient taking differently: Take 100 mg by mouth at bedtime. ) 30 capsule 6  .  glyBURIDE (DIABETA) 5  MG tablet Take 10 mg by mouth 2 (two) times daily with a meal.     . LEVEMIR FLEXTOUCH 100 UNIT/ML Pen Inject 55 Units into the skin daily.     Marland Kitchen loperamide (IMODIUM A-D) 2 MG capsule Take 2-4 mg by mouth 4 (four) times daily as needed for diarrhea or loose stools.     Marland Kitchen NOVOLOG FLEXPEN 100 UNIT/ML FlexPen Inject 7 Units into the skin 2 (two) times daily.     Marland Kitchen oxyCODONE-acetaminophen (PERCOCET/ROXICET) 5-325 MG tablet Take 1 tablet by mouth daily as needed for severe pain.    . simvastatin (ZOCOR) 20 MG tablet Take 20 mg by mouth every evening.     . vitamin B-12 (CYANOCOBALAMIN) 1000 MCG tablet Take 1,000 mcg by mouth daily.     No current facility-administered medications for this visit.   Facility-Administered Medications Ordered in Other Visits  Medication Dose Route Frequency Provider Last Rate Last Admin  . sodium chloride flush (NS) 0.9 % injection 10 mL  10 mL Intravenous PRN Cammie Sickle, MD   10 mL at 01/30/16 1054    PHYSICAL EXAMINATION: ECOG PERFORMANCE STATUS: 1 - Symptomatic but completely ambulatory  BP 123/76 (BP Location: Right Arm, Patient Position: Sitting)   Pulse 67   Temp (!) 96.2 F (35.7 C) (Tympanic)   Resp 20   Ht 5' 3"  (1.6 m)   Wt 197 lb (89.4 kg)   BMI 34.90 kg/m   Filed Weights   10/28/19 0840  Weight: 197 lb (89.4 kg)    Physical Exam  Constitutional: She is oriented to person, place, and time and well-developed, well-nourished, and in no distress.  She is alone.  HENT:  Head: Normocephalic and atraumatic.  Mouth/Throat: Oropharynx is clear and moist. No oropharyngeal exudate.  Eyes: Pupils are equal, round, and reactive to light.  Cardiovascular: Normal rate and regular rhythm.  Pulmonary/Chest: Breath sounds normal. No respiratory distress. She has no wheezes.  Abdominal: Soft. Bowel sounds are normal. She exhibits no distension and no mass. There is no abdominal tenderness. There is no rebound and no  guarding.  Musculoskeletal:        General: Edema present. No tenderness. Normal range of motion.     Cervical back: Normal range of motion and neck supple.  Neurological: She is alert and oriented to person, place, and time.  Skin: Skin is warm.  Right thumb bandaged-wound healing.  No signs of infection or pus.  Psychiatric: Affect normal.    LABORATORY DATA:  I have reviewed the data as listed    Component Value Date/Time   NA 132 (L) 10/28/2019 0807   NA 130 (L) 06/06/2014 1102   K 4.6 10/28/2019 0807   K 3.9 06/06/2014 1102   CL 101 10/28/2019 0807   CL 95 (L) 06/06/2014 1102   CO2 22 10/28/2019 0807   CO2 28 06/06/2014 1102   GLUCOSE 123 (H) 10/28/2019 0807   GLUCOSE 349 (H) 06/06/2014 1102   BUN 49 (H) 10/28/2019 0807   BUN 17 06/06/2014 1102   CREATININE 3.35 (H) 10/28/2019 0807   CREATININE 1.63 (H) 06/06/2014 1102   CALCIUM 8.4 (L) 10/28/2019 0807   CALCIUM 9.2 06/06/2014 1102   PROT 7.1 10/28/2019 0807   PROT 8.2 06/06/2014 1102   ALBUMIN 3.8 10/28/2019 0807   ALBUMIN 3.3 (L) 06/06/2014 1102   AST 20 10/28/2019 0807   AST 7 (L) 06/06/2014 1102   ALT 16 10/28/2019 0807   ALT 12 (L) 06/06/2014 1102  ALKPHOS 61 10/28/2019 0807   ALKPHOS 74 06/06/2014 1102   BILITOT 0.5 10/28/2019 0807   BILITOT 0.4 06/06/2014 1102   GFRNONAA 14 (L) 10/28/2019 0807   GFRNONAA 35 (L) 06/06/2014 1102   GFRAA 16 (L) 10/28/2019 0807   GFRAA 42 (L) 06/06/2014 1102    No results found for: SPEP, UPEP  Lab Results  Component Value Date   WBC 7.9 10/28/2019   NEUTROABS 5.2 10/28/2019   HGB 9.8 (L) 10/28/2019   HCT 28.5 (L) 10/28/2019   MCV 94.7 10/28/2019   PLT 320 10/28/2019      Chemistry      Component Value Date/Time   NA 132 (L) 10/28/2019 0807   NA 130 (L) 06/06/2014 1102   K 4.6 10/28/2019 0807   K 3.9 06/06/2014 1102   CL 101 10/28/2019 0807   CL 95 (L) 06/06/2014 1102   CO2 22 10/28/2019 0807   CO2 28 06/06/2014 1102   BUN 49 (H) 10/28/2019 0807   BUN  17 06/06/2014 1102   CREATININE 3.35 (H) 10/28/2019 0807   CREATININE 1.63 (H) 06/06/2014 1102      Component Value Date/Time   CALCIUM 8.4 (L) 10/28/2019 0807   CALCIUM 9.2 06/06/2014 1102   ALKPHOS 61 10/28/2019 0807   ALKPHOS 74 06/06/2014 1102   AST 20 10/28/2019 0807   AST 7 (L) 06/06/2014 1102   ALT 16 10/28/2019 0807   ALT 12 (L) 06/06/2014 1102   BILITOT 0.5 10/28/2019 0807   BILITOT 0.4 06/06/2014 1102       RADIOGRAPHIC STUDIES: I have personally reviewed the radiological images as listed and agreed with the findings in the report. No results found.   ASSESSMENT & PLAN:  Carcinoma of upper-inner quadrant of left breast in female, estrogen receptor positive ([place]) Left breast cancer- stage IV- ER/PR +, HER-2/neu. Currently on eribulin [renally dosed [start at 1 mg/m] started on-04/29/2019.   PET scan JAN 5th, 2020-Partial response/ uptake pleural-based lesion/lung lesions/left breast mass. STABLE; but tumor marker rising.   #Proceed with eribulin.cycle number 7-day 1  Labs today reviewed;  acceptable for treatment today.   # Bone mets-sclerotic; Right acetabular uptake-s/p radiation. Hypocalcemia-ca-8.3/low vitD- on vit D daily.  Stable.  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  Patient needing physical therapy.  Awaiting MRI/Ortho evaluation.  # recent acute on Chronic kidney disease - stage IV-stable.  Patient did not need dialysis.  Holding off dialysis.   # Anemia- hemoglobin today-8.3; STABLE;  On PO iron.  [vacation April 10th thru 24th]  # DISPOSITION:  #  ERIBULIN today # 1 week- labs- cbc/bmp-Eribulin # Follow up in 3 weeks; /MD-labs- [facility]-CQ-58-48; Eribulin; - Dr.B   Orders Placed This Encounter  Procedures  . CBC with Differential    Standing Status:   Future    Standing Expiration Date:   10/27/2020  . Basic metabolic panel    Standing Status:   Future    Standing Expiration Date:    10/27/2020   All questions were answered. The patient knows to call the clinic with any problems, questions or concerns.      Cammie Sickle, MD 10/28/2019 12:37 PM

## 2019-10-28 NOTE — Assessment & Plan Note (Addendum)
Left breast cancer- stage IV- ER/PR +, HER-2/neu. Currently on eribulin [renally dosed [start at 1 mg/m] started on-04/29/2019.   PET scan JAN 5th, 2020-Partial response/ uptake pleural-based lesion/lung lesions/left breast mass. STABLE; but tumor marker rising.   #Proceed with eribulin.cycle number 7-day 1  Labs today reviewed;  acceptable for treatment today.   # Bone mets-sclerotic; Right acetabular uptake-s/p radiation. Hypocalcemia-ca-8.3/low vitD- on vit D daily.  Stable.  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  Patient needing physical therapy.  Awaiting MRI/Ortho evaluation.  # recent acute on Chronic kidney disease - stage IV-stable.  Patient did not need dialysis.  Holding off dialysis.   # Anemia- hemoglobin today-8.3; STABLE;  On PO iron.  [vacation April 10th thru 24th]  # DISPOSITION:  #  ERIBULIN today # 1 week- labs- cbc/bmp-Eribulin # Follow up in 3 weeks; /MD-labs- cbc/cmp-ca-27-29; Eribulin; - Dr.B 

## 2019-10-29 LAB — CANCER ANTIGEN 27.29: CA 27.29: 98.7 U/mL — ABNORMAL HIGH (ref 0.0–38.6)

## 2019-10-31 DIAGNOSIS — I5022 Chronic systolic (congestive) heart failure: Secondary | ICD-10-CM | POA: Diagnosis not present

## 2019-10-31 DIAGNOSIS — F418 Other specified anxiety disorders: Secondary | ICD-10-CM | POA: Diagnosis not present

## 2019-10-31 DIAGNOSIS — N184 Chronic kidney disease, stage 4 (severe): Secondary | ICD-10-CM | POA: Diagnosis not present

## 2019-10-31 DIAGNOSIS — I130 Hypertensive heart and chronic kidney disease with heart failure and stage 1 through stage 4 chronic kidney disease, or unspecified chronic kidney disease: Secondary | ICD-10-CM | POA: Diagnosis not present

## 2019-10-31 DIAGNOSIS — E1142 Type 2 diabetes mellitus with diabetic polyneuropathy: Secondary | ICD-10-CM | POA: Diagnosis not present

## 2019-10-31 DIAGNOSIS — D631 Anemia in chronic kidney disease: Secondary | ICD-10-CM | POA: Diagnosis not present

## 2019-10-31 DIAGNOSIS — E1122 Type 2 diabetes mellitus with diabetic chronic kidney disease: Secondary | ICD-10-CM | POA: Diagnosis not present

## 2019-10-31 DIAGNOSIS — F329 Major depressive disorder, single episode, unspecified: Secondary | ICD-10-CM | POA: Diagnosis not present

## 2019-10-31 DIAGNOSIS — J45909 Unspecified asthma, uncomplicated: Secondary | ICD-10-CM | POA: Diagnosis not present

## 2019-11-02 DIAGNOSIS — F329 Major depressive disorder, single episode, unspecified: Secondary | ICD-10-CM | POA: Diagnosis not present

## 2019-11-02 DIAGNOSIS — D631 Anemia in chronic kidney disease: Secondary | ICD-10-CM | POA: Diagnosis not present

## 2019-11-02 DIAGNOSIS — N184 Chronic kidney disease, stage 4 (severe): Secondary | ICD-10-CM | POA: Diagnosis not present

## 2019-11-02 DIAGNOSIS — I5022 Chronic systolic (congestive) heart failure: Secondary | ICD-10-CM | POA: Diagnosis not present

## 2019-11-02 DIAGNOSIS — E1122 Type 2 diabetes mellitus with diabetic chronic kidney disease: Secondary | ICD-10-CM | POA: Diagnosis not present

## 2019-11-02 DIAGNOSIS — E1142 Type 2 diabetes mellitus with diabetic polyneuropathy: Secondary | ICD-10-CM | POA: Diagnosis not present

## 2019-11-02 DIAGNOSIS — I130 Hypertensive heart and chronic kidney disease with heart failure and stage 1 through stage 4 chronic kidney disease, or unspecified chronic kidney disease: Secondary | ICD-10-CM | POA: Diagnosis not present

## 2019-11-02 DIAGNOSIS — J45909 Unspecified asthma, uncomplicated: Secondary | ICD-10-CM | POA: Diagnosis not present

## 2019-11-02 DIAGNOSIS — F418 Other specified anxiety disorders: Secondary | ICD-10-CM | POA: Diagnosis not present

## 2019-11-04 ENCOUNTER — Inpatient Hospital Stay: Payer: Medicare HMO | Attending: Internal Medicine

## 2019-11-04 ENCOUNTER — Inpatient Hospital Stay: Payer: Medicare HMO

## 2019-11-04 ENCOUNTER — Other Ambulatory Visit: Payer: Self-pay

## 2019-11-04 ENCOUNTER — Other Ambulatory Visit: Payer: Self-pay | Admitting: *Deleted

## 2019-11-04 VITALS — BP 131/82 | HR 67 | Temp 97.0°F | Resp 18

## 2019-11-04 DIAGNOSIS — C50212 Malignant neoplasm of upper-inner quadrant of left female breast: Secondary | ICD-10-CM | POA: Insufficient documentation

## 2019-11-04 DIAGNOSIS — I5022 Chronic systolic (congestive) heart failure: Secondary | ICD-10-CM | POA: Diagnosis not present

## 2019-11-04 DIAGNOSIS — F329 Major depressive disorder, single episode, unspecified: Secondary | ICD-10-CM | POA: Diagnosis not present

## 2019-11-04 DIAGNOSIS — Z833 Family history of diabetes mellitus: Secondary | ICD-10-CM | POA: Insufficient documentation

## 2019-11-04 DIAGNOSIS — Z79899 Other long term (current) drug therapy: Secondary | ICD-10-CM | POA: Insufficient documentation

## 2019-11-04 DIAGNOSIS — D631 Anemia in chronic kidney disease: Secondary | ICD-10-CM | POA: Diagnosis not present

## 2019-11-04 DIAGNOSIS — Z8349 Family history of other endocrine, nutritional and metabolic diseases: Secondary | ICD-10-CM | POA: Diagnosis not present

## 2019-11-04 DIAGNOSIS — C7951 Secondary malignant neoplasm of bone: Secondary | ICD-10-CM | POA: Diagnosis not present

## 2019-11-04 DIAGNOSIS — I130 Hypertensive heart and chronic kidney disease with heart failure and stage 1 through stage 4 chronic kidney disease, or unspecified chronic kidney disease: Secondary | ICD-10-CM | POA: Diagnosis not present

## 2019-11-04 DIAGNOSIS — Z7982 Long term (current) use of aspirin: Secondary | ICD-10-CM | POA: Insufficient documentation

## 2019-11-04 DIAGNOSIS — N184 Chronic kidney disease, stage 4 (severe): Secondary | ICD-10-CM | POA: Insufficient documentation

## 2019-11-04 DIAGNOSIS — R2689 Other abnormalities of gait and mobility: Secondary | ICD-10-CM | POA: Diagnosis not present

## 2019-11-04 DIAGNOSIS — E1142 Type 2 diabetes mellitus with diabetic polyneuropathy: Secondary | ICD-10-CM | POA: Diagnosis not present

## 2019-11-04 DIAGNOSIS — Z170 Estrogen receptor positive status [ER+]: Secondary | ICD-10-CM | POA: Diagnosis not present

## 2019-11-04 DIAGNOSIS — Z8249 Family history of ischemic heart disease and other diseases of the circulatory system: Secondary | ICD-10-CM | POA: Diagnosis not present

## 2019-11-04 DIAGNOSIS — J45909 Unspecified asthma, uncomplicated: Secondary | ICD-10-CM | POA: Insufficient documentation

## 2019-11-04 DIAGNOSIS — D649 Anemia, unspecified: Secondary | ICD-10-CM | POA: Diagnosis not present

## 2019-11-04 DIAGNOSIS — Z8041 Family history of malignant neoplasm of ovary: Secondary | ICD-10-CM | POA: Insufficient documentation

## 2019-11-04 DIAGNOSIS — E1122 Type 2 diabetes mellitus with diabetic chronic kidney disease: Secondary | ICD-10-CM | POA: Diagnosis not present

## 2019-11-04 DIAGNOSIS — Z803 Family history of malignant neoplasm of breast: Secondary | ICD-10-CM | POA: Insufficient documentation

## 2019-11-04 DIAGNOSIS — Z923 Personal history of irradiation: Secondary | ICD-10-CM | POA: Diagnosis not present

## 2019-11-04 DIAGNOSIS — I509 Heart failure, unspecified: Secondary | ICD-10-CM | POA: Diagnosis not present

## 2019-11-04 DIAGNOSIS — Z801 Family history of malignant neoplasm of trachea, bronchus and lung: Secondary | ICD-10-CM | POA: Diagnosis not present

## 2019-11-04 DIAGNOSIS — F418 Other specified anxiety disorders: Secondary | ICD-10-CM | POA: Diagnosis not present

## 2019-11-04 DIAGNOSIS — Z7951 Long term (current) use of inhaled steroids: Secondary | ICD-10-CM | POA: Insufficient documentation

## 2019-11-04 DIAGNOSIS — F419 Anxiety disorder, unspecified: Secondary | ICD-10-CM | POA: Diagnosis not present

## 2019-11-04 DIAGNOSIS — K5900 Constipation, unspecified: Secondary | ICD-10-CM | POA: Diagnosis not present

## 2019-11-04 DIAGNOSIS — Z87891 Personal history of nicotine dependence: Secondary | ICD-10-CM | POA: Diagnosis not present

## 2019-11-04 DIAGNOSIS — M25569 Pain in unspecified knee: Secondary | ICD-10-CM | POA: Insufficient documentation

## 2019-11-04 DIAGNOSIS — Z800 Family history of malignant neoplasm of digestive organs: Secondary | ICD-10-CM | POA: Insufficient documentation

## 2019-11-04 DIAGNOSIS — Z5111 Encounter for antineoplastic chemotherapy: Secondary | ICD-10-CM | POA: Diagnosis not present

## 2019-11-04 DIAGNOSIS — Z7189 Other specified counseling: Secondary | ICD-10-CM

## 2019-11-04 LAB — COMPREHENSIVE METABOLIC PANEL
ALT: 18 U/L (ref 0–44)
AST: 19 U/L (ref 15–41)
Albumin: 3.9 g/dL (ref 3.5–5.0)
Alkaline Phosphatase: 60 U/L (ref 38–126)
Anion gap: 10 (ref 5–15)
BUN: 42 mg/dL — ABNORMAL HIGH (ref 8–23)
CO2: 23 mmol/L (ref 22–32)
Calcium: 9 mg/dL (ref 8.9–10.3)
Chloride: 101 mmol/L (ref 98–111)
Creatinine, Ser: 2.81 mg/dL — ABNORMAL HIGH (ref 0.44–1.00)
GFR calc Af Amer: 20 mL/min — ABNORMAL LOW (ref >60)
GFR calc non Af Amer: 17 mL/min — ABNORMAL LOW (ref >60)
Glucose, Bld: 162 mg/dL — ABNORMAL HIGH (ref 70–99)
Potassium: 4.4 mmol/L (ref 3.5–5.1)
Sodium: 134 mmol/L — ABNORMAL LOW (ref 135–145)
Total Bilirubin: 0.4 mg/dL (ref 0.3–1.2)
Total Protein: 7.5 g/dL (ref 6.5–8.1)

## 2019-11-04 LAB — CBC WITH DIFFERENTIAL/PLATELET
Abs Immature Granulocytes: 0.34 10*3/uL — ABNORMAL HIGH (ref 0.00–0.07)
Basophils Absolute: 0.2 10*3/uL — ABNORMAL HIGH (ref 0.0–0.1)
Basophils Relative: 2 %
Eosinophils Absolute: 0 10*3/uL (ref 0.0–0.5)
Eosinophils Relative: 0 %
HCT: 29 % — ABNORMAL LOW (ref 36.0–46.0)
Hemoglobin: 9.9 g/dL — ABNORMAL LOW (ref 12.0–15.0)
Immature Granulocytes: 3 %
Lymphocytes Relative: 9 %
Lymphs Abs: 0.9 10*3/uL (ref 0.7–4.0)
MCH: 31.9 pg (ref 26.0–34.0)
MCHC: 34.1 g/dL (ref 30.0–36.0)
MCV: 93.5 fL (ref 80.0–100.0)
Monocytes Absolute: 0.3 10*3/uL (ref 0.1–1.0)
Monocytes Relative: 3 %
Neutro Abs: 8.3 10*3/uL — ABNORMAL HIGH (ref 1.7–7.7)
Neutrophils Relative %: 83 %
Platelets: 333 10*3/uL (ref 150–400)
RBC: 3.1 MIL/uL — ABNORMAL LOW (ref 3.87–5.11)
RDW: 13 % (ref 11.5–15.5)
WBC: 10 10*3/uL (ref 4.0–10.5)
nRBC: 0 % (ref 0.0–0.2)

## 2019-11-04 MED ORDER — SODIUM CHLORIDE 0.9% FLUSH
10.0000 mL | Freq: Once | INTRAVENOUS | Status: AC
  Administered 2019-11-04: 10 mL via INTRAVENOUS
  Filled 2019-11-04: qty 10

## 2019-11-04 MED ORDER — HEPARIN SOD (PORK) LOCK FLUSH 100 UNIT/ML IV SOLN
INTRAVENOUS | Status: AC
  Filled 2019-11-04: qty 5

## 2019-11-04 MED ORDER — GABAPENTIN 100 MG PO CAPS: ORAL_CAPSULE | ORAL | 6 refills | Status: AC

## 2019-11-04 MED ORDER — PROCHLORPERAZINE MALEATE 10 MG PO TABS
10.0000 mg | ORAL_TABLET | Freq: Once | ORAL | Status: AC
  Administered 2019-11-04: 10 mg via ORAL
  Filled 2019-11-04: qty 1

## 2019-11-04 MED ORDER — SODIUM CHLORIDE 0.9 % IV SOLN
Freq: Once | INTRAVENOUS | Status: AC
  Filled 2019-11-04: qty 250

## 2019-11-04 MED ORDER — SODIUM CHLORIDE 0.9 % IV SOLN
2.0000 mg | Freq: Once | INTRAVENOUS | Status: AC
  Administered 2019-11-04: 2 mg via INTRAVENOUS
  Filled 2019-11-04: qty 4

## 2019-11-04 MED ORDER — HEPARIN SOD (PORK) LOCK FLUSH 100 UNIT/ML IV SOLN
500.0000 [IU] | Freq: Once | INTRAVENOUS | Status: AC | PRN
  Administered 2019-11-04: 500 [IU]
  Filled 2019-11-04: qty 5

## 2019-11-05 LAB — CANCER ANTIGEN 27.29: CA 27.29: 101.9 U/mL — ABNORMAL HIGH (ref 0.0–38.6)

## 2019-11-07 DIAGNOSIS — F418 Other specified anxiety disorders: Secondary | ICD-10-CM | POA: Diagnosis not present

## 2019-11-07 DIAGNOSIS — I5022 Chronic systolic (congestive) heart failure: Secondary | ICD-10-CM | POA: Diagnosis not present

## 2019-11-07 DIAGNOSIS — J45909 Unspecified asthma, uncomplicated: Secondary | ICD-10-CM | POA: Diagnosis not present

## 2019-11-07 DIAGNOSIS — D631 Anemia in chronic kidney disease: Secondary | ICD-10-CM | POA: Diagnosis not present

## 2019-11-07 DIAGNOSIS — N184 Chronic kidney disease, stage 4 (severe): Secondary | ICD-10-CM | POA: Diagnosis not present

## 2019-11-07 DIAGNOSIS — I130 Hypertensive heart and chronic kidney disease with heart failure and stage 1 through stage 4 chronic kidney disease, or unspecified chronic kidney disease: Secondary | ICD-10-CM | POA: Diagnosis not present

## 2019-11-07 DIAGNOSIS — E1142 Type 2 diabetes mellitus with diabetic polyneuropathy: Secondary | ICD-10-CM | POA: Diagnosis not present

## 2019-11-07 DIAGNOSIS — F329 Major depressive disorder, single episode, unspecified: Secondary | ICD-10-CM | POA: Diagnosis not present

## 2019-11-07 DIAGNOSIS — S86811D Strain of other muscle(s) and tendon(s) at lower leg level, right leg, subsequent encounter: Secondary | ICD-10-CM | POA: Diagnosis not present

## 2019-11-07 DIAGNOSIS — E1122 Type 2 diabetes mellitus with diabetic chronic kidney disease: Secondary | ICD-10-CM | POA: Diagnosis not present

## 2019-11-08 DIAGNOSIS — S83206A Unspecified tear of unspecified meniscus, current injury, right knee, initial encounter: Secondary | ICD-10-CM | POA: Diagnosis not present

## 2019-11-09 ENCOUNTER — Telehealth: Payer: Self-pay | Admitting: *Deleted

## 2019-11-09 DIAGNOSIS — F329 Major depressive disorder, single episode, unspecified: Secondary | ICD-10-CM | POA: Diagnosis not present

## 2019-11-09 DIAGNOSIS — J45909 Unspecified asthma, uncomplicated: Secondary | ICD-10-CM | POA: Diagnosis not present

## 2019-11-09 DIAGNOSIS — I130 Hypertensive heart and chronic kidney disease with heart failure and stage 1 through stage 4 chronic kidney disease, or unspecified chronic kidney disease: Secondary | ICD-10-CM | POA: Diagnosis not present

## 2019-11-09 DIAGNOSIS — E1122 Type 2 diabetes mellitus with diabetic chronic kidney disease: Secondary | ICD-10-CM | POA: Diagnosis not present

## 2019-11-09 DIAGNOSIS — I5022 Chronic systolic (congestive) heart failure: Secondary | ICD-10-CM | POA: Diagnosis not present

## 2019-11-09 DIAGNOSIS — F418 Other specified anxiety disorders: Secondary | ICD-10-CM | POA: Diagnosis not present

## 2019-11-09 DIAGNOSIS — E1142 Type 2 diabetes mellitus with diabetic polyneuropathy: Secondary | ICD-10-CM | POA: Diagnosis not present

## 2019-11-09 DIAGNOSIS — N184 Chronic kidney disease, stage 4 (severe): Secondary | ICD-10-CM | POA: Diagnosis not present

## 2019-11-09 DIAGNOSIS — D631 Anemia in chronic kidney disease: Secondary | ICD-10-CM | POA: Diagnosis not present

## 2019-11-09 NOTE — Telephone Encounter (Signed)
Md received a note from [facility] to request for medical clearance for knee surgery.  Per Dr. Rogue Bussing. MD unable to clear/approve patient for surgery until after he evaluates her in April

## 2019-11-11 DIAGNOSIS — C50919 Malignant neoplasm of unspecified site of unspecified female breast: Secondary | ICD-10-CM | POA: Diagnosis not present

## 2019-11-11 DIAGNOSIS — Z794 Long term (current) use of insulin: Secondary | ICD-10-CM | POA: Diagnosis not present

## 2019-11-11 DIAGNOSIS — E1122 Type 2 diabetes mellitus with diabetic chronic kidney disease: Secondary | ICD-10-CM | POA: Diagnosis not present

## 2019-11-11 DIAGNOSIS — N184 Chronic kidney disease, stage 4 (severe): Secondary | ICD-10-CM | POA: Diagnosis not present

## 2019-11-11 DIAGNOSIS — C799 Secondary malignant neoplasm of unspecified site: Secondary | ICD-10-CM | POA: Diagnosis not present

## 2019-11-11 DIAGNOSIS — N179 Acute kidney failure, unspecified: Secondary | ICD-10-CM | POA: Diagnosis not present

## 2019-11-11 DIAGNOSIS — I5022 Chronic systolic (congestive) heart failure: Secondary | ICD-10-CM | POA: Diagnosis not present

## 2019-11-17 DIAGNOSIS — S83209A Unspecified tear of unspecified meniscus, current injury, unspecified knee, initial encounter: Secondary | ICD-10-CM | POA: Insufficient documentation

## 2019-11-21 NOTE — Progress Notes (Signed)
Pharmacist Chemotherapy Monitoring - Follow Up Assessment    I verify that I have reviewed each item in the below checklist:  . Regimen for the patient is scheduled for the appropriate day and plan matches scheduled date. Marland Kitchen Appropriate non-routine labs are ordered dependent on drug ordered. . If applicable, additional medications reviewed and ordered per protocol based on lifetime cumulative doses and/or treatment regimen.   Plan for follow-up and/or issues identified: No . I-vent associated with next due treatment: No . MD and/or nursing notified: No  [name] 11/21/2019 9:04 AM

## 2019-11-24 ENCOUNTER — Other Ambulatory Visit: Payer: Self-pay | Admitting: *Deleted

## 2019-11-24 DIAGNOSIS — C50212 Malignant neoplasm of upper-inner quadrant of left female breast: Secondary | ICD-10-CM

## 2019-11-24 DIAGNOSIS — Z170 Estrogen receptor positive status [ER+]: Secondary | ICD-10-CM

## 2019-11-25 ENCOUNTER — Encounter: Payer: Self-pay | Admitting: Internal Medicine

## 2019-11-28 ENCOUNTER — Inpatient Hospital Stay: Payer: Medicare HMO

## 2019-11-28 ENCOUNTER — Encounter: Payer: Self-pay | Admitting: Internal Medicine

## 2019-11-28 ENCOUNTER — Inpatient Hospital Stay (HOSPITAL_BASED_OUTPATIENT_CLINIC_OR_DEPARTMENT_OTHER): Payer: Medicare HMO | Admitting: Internal Medicine

## 2019-11-28 ENCOUNTER — Other Ambulatory Visit: Payer: Self-pay

## 2019-11-28 DIAGNOSIS — C50212 Malignant neoplasm of upper-inner quadrant of left female breast: Secondary | ICD-10-CM

## 2019-11-28 DIAGNOSIS — Z87891 Personal history of nicotine dependence: Secondary | ICD-10-CM | POA: Diagnosis not present

## 2019-11-28 DIAGNOSIS — Z170 Estrogen receptor positive status [ER+]: Secondary | ICD-10-CM | POA: Diagnosis not present

## 2019-11-28 DIAGNOSIS — C7951 Secondary malignant neoplasm of bone: Secondary | ICD-10-CM | POA: Diagnosis not present

## 2019-11-28 DIAGNOSIS — Z5111 Encounter for antineoplastic chemotherapy: Secondary | ICD-10-CM | POA: Diagnosis not present

## 2019-11-28 DIAGNOSIS — R2689 Other abnormalities of gait and mobility: Secondary | ICD-10-CM | POA: Diagnosis not present

## 2019-11-28 DIAGNOSIS — Z7189 Other specified counseling: Secondary | ICD-10-CM

## 2019-11-28 DIAGNOSIS — D649 Anemia, unspecified: Secondary | ICD-10-CM | POA: Diagnosis not present

## 2019-11-28 DIAGNOSIS — M25569 Pain in unspecified knee: Secondary | ICD-10-CM | POA: Diagnosis not present

## 2019-11-28 LAB — COMPREHENSIVE METABOLIC PANEL
ALT: 17 U/L (ref 0–44)
AST: 19 U/L (ref 15–41)
Albumin: 3.4 g/dL — ABNORMAL LOW (ref 3.5–5.0)
Alkaline Phosphatase: 57 U/L (ref 38–126)
Anion gap: 9 (ref 5–15)
BUN: 46 mg/dL — ABNORMAL HIGH (ref 8–23)
CO2: 26 mmol/L (ref 22–32)
Calcium: 9.1 mg/dL (ref 8.9–10.3)
Chloride: 103 mmol/L (ref 98–111)
Creatinine, Ser: 2.72 mg/dL — ABNORMAL HIGH (ref 0.44–1.00)
GFR calc Af Amer: 21 mL/min — ABNORMAL LOW (ref >60)
GFR calc non Af Amer: 18 mL/min — ABNORMAL LOW (ref >60)
Glucose, Bld: 102 mg/dL — ABNORMAL HIGH (ref 70–99)
Potassium: 4.3 mmol/L (ref 3.5–5.1)
Sodium: 138 mmol/L (ref 135–145)
Total Bilirubin: 0.6 mg/dL (ref 0.3–1.2)
Total Protein: 7 g/dL (ref 6.5–8.1)

## 2019-11-28 LAB — CBC WITH DIFFERENTIAL/PLATELET
Abs Immature Granulocytes: 0.2 10*3/uL — ABNORMAL HIGH (ref 0.00–0.07)
Basophils Absolute: 0.1 10*3/uL (ref 0.0–0.1)
Basophils Relative: 1 %
Eosinophils Absolute: 0.1 10*3/uL (ref 0.0–0.5)
Eosinophils Relative: 1 %
HCT: 28.1 % — ABNORMAL LOW (ref 36.0–46.0)
Hemoglobin: 9.6 g/dL — ABNORMAL LOW (ref 12.0–15.0)
Immature Granulocytes: 2 %
Lymphocytes Relative: 11 %
Lymphs Abs: 1.1 10*3/uL (ref 0.7–4.0)
MCH: 32.3 pg (ref 26.0–34.0)
MCHC: 34.2 g/dL (ref 30.0–36.0)
MCV: 94.6 fL (ref 80.0–100.0)
Monocytes Absolute: 1 10*3/uL (ref 0.1–1.0)
Monocytes Relative: 10 %
Neutro Abs: 7.9 10*3/uL — ABNORMAL HIGH (ref 1.7–7.7)
Neutrophils Relative %: 75 %
Platelets: 262 10*3/uL (ref 150–400)
RBC: 2.97 MIL/uL — ABNORMAL LOW (ref 3.87–5.11)
RDW: 13.6 % (ref 11.5–15.5)
WBC: 10.2 10*3/uL (ref 4.0–10.5)
nRBC: 0 % (ref 0.0–0.2)

## 2019-11-28 MED ORDER — SODIUM CHLORIDE 0.9 % IV SOLN
Freq: Once | INTRAVENOUS | Status: AC
  Filled 2019-11-28: qty 250

## 2019-11-28 MED ORDER — HEPARIN SOD (PORK) LOCK FLUSH 100 UNIT/ML IV SOLN
INTRAVENOUS | Status: AC
  Filled 2019-11-28: qty 5

## 2019-11-28 MED ORDER — HEPARIN SOD (PORK) LOCK FLUSH 100 UNIT/ML IV SOLN
500.0000 [IU] | Freq: Once | INTRAVENOUS | Status: AC | PRN
  Administered 2019-11-28: 500 [IU]
  Filled 2019-11-28: qty 5

## 2019-11-28 MED ORDER — SODIUM CHLORIDE 0.9 % IV SOLN
2.0000 mg | Freq: Once | INTRAVENOUS | Status: AC
  Administered 2019-11-28: 2 mg via INTRAVENOUS
  Filled 2019-11-28: qty 4

## 2019-11-28 MED ORDER — PROCHLORPERAZINE MALEATE 10 MG PO TABS
10.0000 mg | ORAL_TABLET | Freq: Once | ORAL | Status: AC
  Administered 2019-11-28: 10 mg via ORAL
  Filled 2019-11-28: qty 1

## 2019-11-28 NOTE — Assessment & Plan Note (Addendum)
Left breast cancer- stage IV- ER/PR +, HER-2/neu. Currently on eribulin [renally dosed [start at 1 mg/m] started on-04/29/2019.   PET scan JAN 5th, 2020-Partial response/ uptake pleural-based lesion/lung lesions/left breast mass. STABLE; but tumor marker rising.   #Proceed with eribulin.cycle number 7-day 1  Labs today reviewed;  acceptable for treatment today.   # Bone mets-sclerotic; Right acetabular uptake-s/p radiation. Hypocalcemia-ca-8.3/low vitD- on vit D daily.  Stable.  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  Patient needing physical therapy; okay to proceed with arthroscopy-however await PET scan.  # recent acute on Chronic kidney disease - stage IV-stable.  # Anemia- hemoglobin today-8.3; STABLE. On PO iron.  # constipation- miralax BID; stable  # DISPOSITION:  #  ERIBULIN today # 7th may- labs- cbc/cmp; Eribulin # Follow up ion 21st ; /MD-labs- cbc/cmp-ca-27-29; Eribulin;PET scan - Dr.B

## 2019-11-28 NOTE — Progress Notes (Signed)
[place] OFFICE PROGRESS NOTE  Patient Care Team: [name], [name], MD as Medical Oncologist (Medical Oncology) Corey Skains, MD as Consulting Physician (Cardiology)  Cancer Staging No matching staging information was found for the patient.   Oncology History Overview Note  # OCT 2015-STAGE IV LEFT BREAST T2N1 [T=4cm; [name]; [name]; [name]; EBUS- Positive Paratrac/subcarinal LN s/p ? Taxotere [in [place]; [name]; [name];[name]; [name]; suv 3]; [name]; [name]; [name];  [name]; Ant R iliac bone; [name]; [name]; START-Taxol weekly. March 2020- Taxol every 2 weeks [PN]; SEP 2020- PET progression  # SEP 04/29/2019- ERIBULIN s/p RT - Right hip- [s/p RT- NOV 2020]  # Poorly controlled Blood sugars- improved.   # Pancreatitis Hx/PEI- on creon in past / CKD IV [creat ~ 3-4; [name]; [name]; mild left sided weakness]  # Jan 2020-  Lobular lesion on tongue- s/p excision pyogenic granuloma [Dr.McQueen]   # GENETIC TESTING/COUNSELLING: HETEROZYGOUS Cystic Fibrosis Gene [explains hx of recurrent pancreatitis]  # MOLECULAR TESTING: NA   # PALLIATIVE CARE: 1/22-Discussed/Declined ------------------------------------------------   DIAGNOSIS: [ 2015] BREAST CA; [name]; [name] ;GOALS: Palliative  CURRENT/MOST RECENT THERAPY: ERIBULIN [C].     Carcinoma of upper-inner quadrant of left  breast in female, estrogen receptor positive ([place])  04/29/2019 -  Chemotherapy   The patient had eriBULin mesylate ([place]) 2 mg in sodium chloride 0.9 % 100 mL chemo infusion, 2 mg, Intravenous,  Once, 9 of 10 cycles Dose modification: 1 mg/m2 (original dose 1 mg/m2, Cycle 1, Reason: Provider Judgment) Administration: 2 mg (06/03/2019), 2 mg (04/29/2019), 2 mg (06/10/2019), 2 mg (07/04/2019), 2 mg (07/11/2019), 2 mg (07/25/2019), 2 mg (08/03/2019), 2 mg (08/19/2019), 2 mg (08/26/2019), 2 mg (09/09/2019), 2 mg (09/16/2019), 2 mg (10/07/2019), 2 mg (10/14/2019), 2 mg (10/28/2019), 2 mg (11/04/2019), 2 mg (11/28/2019)  for chemotherapy treatment.     INTERVAL HISTORY:  [name] 64 y.o.  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  In the interim patient was evaluated by orthopedics Dr. Harlow Mares.  She is currently undergoing physical therapy.  Denies any nausea vomiting.  Denies any fevers or chills.  Chronic swelling in the legs.  Her trip to [facility] was uneventful.  Review of Systems  Constitutional: Positive for malaise/fatigue. Negative for chills, diaphoresis, [name], hemoptysis, sputum production, [name], palpitations and orthopnea.  Gastrointestinal: Negative for abdominal pain, blood in stool, constipation, diarrhea, heartburn, melena, nausea and vomiting.  Genitourinary: Negative for dysuria, frequency and urgency.  Musculoskeletal: Positive for back pain and joint pain.  Skin: Negative.  Negative for itching and rash.  Neurological: Positive for tingling. Negative for dizziness, focal weakness, weakness and headaches.  Endo/Heme/Allergies: Does not bruise/bleed easily.   Psychiatric/Behavioral: Negative for depression. The patient is not nervous/anxious and does not have insomnia.     PAST MEDICAL HISTORY :  Past Medical History:  Diagnosis Date  . Anemia   . Anxiety   . Asthma   . Cancer ([place]) 03/10/2018   Per NM PET order. Carcinoma of upper-inner quadrant of left breast in female, estrogen receptor positive .  Marland Kitchen Cancer (HCC)    LUNG  . CHF (congestive heart failure) ([place]) 1997  . CKD (chronic kidney disease)   . Depression   . Diabetes mellitus, type 2 ([place])   . Family history of breast cancer   . Family history of colon cancer   . Family history of ovarian cancer   . Family history of pancreatic cancer   . Family history of prostate cancer   . Family history of stomach cancer   . GERD (gastroesophageal reflux disease)    history of an ulcer  . Hair loss   . History of left breast cancer 05/29/14  . History of partial hysterectomy 12/31/2016   Per patient.  Has not had a period in years.  Had a partial hysterectomy years ago.  Marland Kitchen Hypertension   . Mitral valve regurgitation   . Neuromuscular disorder (HCC)    neuropathies in hand  . Obesity   . Pancreatitis 1997  . Stroke [facility]) 2010   with mild left arm weakness    PAST SURGICAL HISTORY :   Past Surgical History:  Procedure Laterality Date  . CATARACT EXTRACTION W/PHACO Right 02/24/2019   Procedure: CATARACT EXTRACTION PHACO AND INTRAOCULAR LENS PLACEMENT ([place]) RIGHT DIABETES;  Surgeon: Marchia Meiers, MD;  Location: [facility] ORS;  Service: Ophthalmology;  Laterality: Right;  [place] 01:13.0 CDE 7.96 Fluid Pack Lot # [id number]  . CATARACT EXTRACTION W/PHACO Left 03/24/2019   Procedure: CATARACT EXTRACTION PHACO AND INTRAOCULAR LENS PLACEMENT ([facility]) - left diabetic;  Surgeon: Marchia Meiers, MD;  Location: [facility] ORS;  Service: Ophthalmology;  Laterality: Left;  [place]  01:36 CDE 13.93 Fluid pack lot # [id number]  . CESAREAN SECTION    . CHOLECYSTECTOMY    . EXCISION OF TONGUE LESION N/A 08/17/2018    Procedure: EXCISION OF TONGUE LESION WITH FROZEN SECTIONS;  Surgeon: Beverly Gust, MD;  Location: [facility] ORS;  Service: ENT;  Laterality: N/A;  . EYE SURGERY Right    cataract extraction  . PARTIAL HYSTERECTOMY  12/31/2016   Per patient, she has not had a period in years since she had a partial hysterectomy.  Marland Kitchen PORTA CATH INSERTION    . TUBAL LIGATION      FAMILY HISTORY :   Family History  Problem Relation Age of Onset  . Ovarian cancer Mother 30  . Diabetes Mother   . Hypertension Mother   . COPD Father   . Hypertension Father   . Colon cancer Father 62  . Diabetes Sister   . Breast cancer Sister 88       bilateral  . Diabetes Brother   . Leukemia Maternal Aunt   . Pancreatic cancer Paternal Aunt 66  . Pancreatic cancer Paternal Uncle   . Colon cancer Paternal Uncle   . Stomach cancer Maternal Grandfather 35  . Throat cancer Paternal Grandmother   . Breast cancer Maternal Aunt 80  . Colon cancer Maternal Aunt   . Bone cancer Maternal Aunt   . Breast cancer Paternal Aunt        dx >50  . Prostate cancer  Paternal Uncle   . Pancreatic cancer Paternal Uncle   . Throat cancer Paternal Uncle   . Lung cancer Paternal Uncle   . Stomach cancer Paternal Uncle   . Brain cancer Paternal Aunt   . Cancer Cousin        liver, kidney  . Prostate cancer Cousin        meastatic  . Lung cancer Other     SOCIAL HISTORY:   Social History   Tobacco Use  . Smoking status: Former Smoker    Packs/day: 0.50    Years: 1.00    Pack years: 0.50    Types: Cigarettes  . Smokeless tobacco: Never Used  Substance Use Topics  . Alcohol use: No    Alcohol/week: 0.0 standard drinks  . Drug use: No    ALLERGIES:  is allergic to fish-derived products and sulfamethoxazole-trimethoprim.  MEDICATIONS:  Current Outpatient Medications  Medication Sig Dispense Refill  . acetaminophen-codeine (TYLENOL #4) 300-60 MG tablet Take 1 tablet by mouth 2 (two) times daily as needed for moderate  pain.     Marland Kitchen albuterol (PROAIR HFA) 108 (90 BASE) MCG/ACT inhaler Inhale 2 puffs into the lungs every 6 (six) hours as needed for wheezing or shortness of breath.     Marland Kitchen albuterol (PROVENTIL) (2.5 MG/3ML) 0.083% nebulizer solution Inhale 2.5 mg into the lungs every 6 (six) hours as needed for shortness of breath.     . ALPRAZolam (XANAX) 0.5 MG tablet Take 0.5 mg by mouth 2 (two) times daily as needed for anxiety or sleep.     Marland Kitchen amLODipine (NORVASC) 10 MG tablet Take 10 mg by mouth daily.     Marland Kitchen aspirin EC 81 MG tablet Take 81 mg by mouth daily.     Marland Kitchen atenolol (TENORMIN) 100 MG tablet Take 100 mg by mouth 2 (two) times daily.     . bumetanide (BUMEX) 0.5 MG tablet Take 0.5 mg by mouth 2 (two) times daily.     . calcitRIOL (ROCALTROL) 0.25 MCG capsule Take 0.25 mcg by mouth daily.     . Cinnamon 500 MG capsule Take 500 mg by mouth 2 (two) times daily.     . cloNIDine (CATAPRES) 0.2 MG tablet Take 0.2 mg by mouth 2 (two) times daily.     . diflorasone (PSORCON) 0.05 % ointment Apply 1 application topically as directed.     Marland Kitchen ELDERBERRY PO Take 1 tablet by mouth daily.    . enalapril (VASOTEC) 10 MG tablet Take 20 mg by mouth 2 (two) times a day.     . famotidine (PEPCID) 20 MG tablet Take 20 mg by mouth 2 (two) times daily.     . ferrous sulfate 325 (65 FE) MG tablet Take 325 mg by mouth 2 (two) times daily with a meal.    . FLUoxetine (PROZAC) 20 MG capsule Take 20 mg by mouth 2 (two) times daily.     . Fluticasone Propionate, Inhal, (FLOVENT DISKUS) 100 MCG/BLIST AEPB Inhale 2 puffs into the lungs 2 (two) times daily as needed (respiratory problems).     . gabapentin (NEURONTIN) 100 MG capsule TAKE 1 CAPSULE(100 MG) BY MOUTH AT BEDTIME 30 capsule 6  . glyBURIDE (DIABETA) 5 MG tablet Take 10 mg by mouth 2 (two) times daily with a meal.     . LEVEMIR FLEXTOUCH 100 UNIT/ML Pen Inject 55 Units into the skin daily.     Marland Kitchen loperamide (IMODIUM A-D) 2 MG capsule Take 2-4 mg by mouth 4 (  four) times daily as  needed for diarrhea or loose stools.     Marland Kitchen NOVOLOG FLEXPEN 100 UNIT/ML FlexPen Inject 7 Units into the skin 2 (two) times daily.     Marland Kitchen oxyCODONE-acetaminophen (PERCOCET/ROXICET) 5-325 MG tablet Take 1 tablet by mouth daily as needed for severe pain.    . simvastatin (ZOCOR) 20 MG tablet Take 20 mg by mouth every evening.     . vitamin B-12 (CYANOCOBALAMIN) 1000 MCG tablet Take 1,000 mcg by mouth daily.     No current facility-administered medications for this visit.   Facility-Administered Medications Ordered in Other Visits  Medication Dose Route Frequency Provider Last Rate Last Admin  . sodium chloride flush (NS) 0.9 % injection 10 mL  10 mL Intravenous PRN Cammie Sickle, MD   10 mL at 01/30/16 1054    PHYSICAL EXAMINATION: ECOG PERFORMANCE STATUS: 1 - Symptomatic but completely ambulatory  BP 126/81 (BP Location: Right Arm, Patient Position: Sitting, Cuff Size: Normal)   Pulse 69   Temp (!) 97 F (36.1 C) (Tympanic)   Filed Weights    Physical Exam  Constitutional: She is oriented to person, place, and time and well-developed, well-nourished, and in no distress.  She is alone.  HENT:  Head: Normocephalic and atraumatic.  Mouth/Throat: Oropharynx is clear and moist. No oropharyngeal exudate.  Eyes: Pupils are equal, round, and reactive to light.  Cardiovascular: Normal rate and regular rhythm.  Pulmonary/Chest: Breath sounds normal. No respiratory distress. She has no wheezes.  Abdominal: Soft. Bowel sounds are normal. She exhibits no distension and no mass. There is no abdominal tenderness. There is no rebound and no guarding.  Musculoskeletal:        General: Edema present. No tenderness. Normal range of motion.     Cervical back: Normal range of motion and neck supple.  Neurological: She is alert and oriented to person, place, and time.  Skin: Skin is warm.  Right thumb bandaged-wound healing.  No signs of infection or pus.  Psychiatric: Affect normal.     LABORATORY DATA:  I have reviewed the data as listed    Component Value Date/Time   NA 138 11/28/2019 0808   NA 130 (L) 06/06/2014 1102   K 4.3 11/28/2019 0808   K 3.9 06/06/2014 1102   CL 103 11/28/2019 0808   CL 95 (L) 06/06/2014 1102   CO2 26 11/28/2019 0808   CO2 28 06/06/2014 1102   GLUCOSE 102 (H) 11/28/2019 0808   GLUCOSE 349 (H) 06/06/2014 1102   BUN 46 (H) 11/28/2019 0808   BUN 17 06/06/2014 1102   CREATININE 2.72 (H) 11/28/2019 0808   CREATININE 1.63 (H) 06/06/2014 1102   CALCIUM 9.1 11/28/2019 0808   CALCIUM 9.2 06/06/2014 1102   PROT 7.0 11/28/2019 0808   PROT 8.2 06/06/2014 1102   ALBUMIN 3.4 (L) 11/28/2019 0808   ALBUMIN 3.3 (L) 06/06/2014 1102   AST 19 11/28/2019 0808   AST 7 (L) 06/06/2014 1102   ALT 17 11/28/2019 0808   ALT 12 (L) 06/06/2014 1102   ALKPHOS 57 11/28/2019 0808   ALKPHOS 74 06/06/2014 1102   BILITOT 0.6 11/28/2019 0808   BILITOT 0.4 06/06/2014 1102   GFRNONAA 18 (L) 11/28/2019 0808   GFRNONAA 35 (L) 06/06/2014 1102   GFRAA 21 (L) 11/28/2019 0808   GFRAA 42 (L) 06/06/2014 1102    No results found for: SPEP, UPEP  Lab Results  Component Value Date   WBC 10.2 11/28/2019   NEUTROABS 7.9 (H)  11/28/2019   HGB 9.6 (L) 11/28/2019   HCT 28.1 (L) 11/28/2019   MCV 94.6 11/28/2019   PLT 262 11/28/2019      Chemistry      Component Value Date/Time   NA 138 11/28/2019 0808   NA 130 (L) 06/06/2014 1102   K 4.3 11/28/2019 0808   K 3.9 06/06/2014 1102   CL 103 11/28/2019 0808   CL 95 (L) 06/06/2014 1102   CO2 26 11/28/2019 0808   CO2 28 06/06/2014 1102   BUN 46 (H) 11/28/2019 0808   BUN 17 06/06/2014 1102   CREATININE 2.72 (H) 11/28/2019 0808   CREATININE 1.63 (H) 06/06/2014 1102      Component Value Date/Time   CALCIUM 9.1 11/28/2019 0808   CALCIUM 9.2 06/06/2014 1102   ALKPHOS 57 11/28/2019 0808   ALKPHOS 74 06/06/2014 1102   AST 19 11/28/2019 0808   AST 7 (L) 06/06/2014 1102   ALT 17 11/28/2019 0808   ALT 12 (L)  06/06/2014 1102   BILITOT 0.6 11/28/2019 0808   BILITOT 0.4 06/06/2014 1102       RADIOGRAPHIC STUDIES: I have personally reviewed the radiological images as listed and agreed with the findings in the report. No results found.   ASSESSMENT & PLAN:  Carcinoma of upper-inner quadrant of left breast in female, estrogen receptor positive ([place]) Left breast cancer- stage IV- ER/PR +, HER-2/neu. Currently on eribulin [renally dosed [start at 1 mg/m] started on-04/29/2019.   PET scan JAN 5th, 2020-Partial response/ uptake pleural-based lesion/lung lesions/left breast mass. STABLE; but tumor marker rising.   #Proceed with eribulin.cycle number 7-day 1  Labs today reviewed;  acceptable for treatment today.   # Bone mets-sclerotic; Right acetabular uptake-s/p radiation. Hypocalcemia-ca-8.3/low vitD- on vit D daily.  Stable.  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  Patient needing physical therapy; okay to proceed with arthroscopy-however await PET scan.  # recent acute on Chronic kidney disease - stage IV-stable.  # Anemia- hemoglobin today-8.3; STABLE. On PO iron.  # constipation- miralax BID; stable  # DISPOSITION:  #  ERIBULIN today # 7th may- labs- cbc/cmp; Eribulin # Follow up ion 21st ; /MD-labs- cbc/cmp-ca-27-29; Eribulin;PET scan - Dr.B   Orders Placed This Encounter  Procedures  . NM PET Image Restag (PS) Skull Base To Thigh    Standing Status:   Future    Standing Expiration Date:   11/27/2020    Order Specific Question:   ** REASON FOR EXAM (FREE TEXT)    Answer:   breast cancer on chemo    Order Specific Question:   If indicated for the ordered procedure, I authorize the administration of a radiopharmaceutical per Radiology protocol    Answer:   Yes    Order Specific Question:   Preferred imaging location?    Answer:   [facility]    Order Specific Question:   Radiology Contrast Protocol - do NOT remove  file path    Answer:   [email or web address]   All questions were answered. The patient knows to call the clinic with any problems, questions or concerns.      Cammie Sickle, MD 12/04/2019 11:38 PM

## 2019-11-29 LAB — CANCER ANTIGEN 27.29: CA 27.29: 101 U/mL — ABNORMAL HIGH (ref 0.0–38.6)

## 2019-11-30 ENCOUNTER — Telehealth: Payer: Self-pay | Admitting: *Deleted

## 2019-11-30 NOTE — Telephone Encounter (Signed)
Left msg with Benjamine Mola at [facility]. Dr. Rogue Bussing would like to talk directly to Dr. Harlow Mares re: patient's need for medical clearance. Benjamine Mola will give the msg to Dr. Harlow Mares.

## 2019-12-01 DIAGNOSIS — F329 Major depressive disorder, single episode, unspecified: Secondary | ICD-10-CM | POA: Diagnosis not present

## 2019-12-01 DIAGNOSIS — F418 Other specified anxiety disorders: Secondary | ICD-10-CM | POA: Diagnosis not present

## 2019-12-01 DIAGNOSIS — N184 Chronic kidney disease, stage 4 (severe): Secondary | ICD-10-CM | POA: Diagnosis not present

## 2019-12-01 DIAGNOSIS — J45909 Unspecified asthma, uncomplicated: Secondary | ICD-10-CM | POA: Diagnosis not present

## 2019-12-01 DIAGNOSIS — E1142 Type 2 diabetes mellitus with diabetic polyneuropathy: Secondary | ICD-10-CM | POA: Diagnosis not present

## 2019-12-01 DIAGNOSIS — I5022 Chronic systolic (congestive) heart failure: Secondary | ICD-10-CM | POA: Diagnosis not present

## 2019-12-01 DIAGNOSIS — D631 Anemia in chronic kidney disease: Secondary | ICD-10-CM | POA: Diagnosis not present

## 2019-12-01 DIAGNOSIS — E1122 Type 2 diabetes mellitus with diabetic chronic kidney disease: Secondary | ICD-10-CM | POA: Diagnosis not present

## 2019-12-01 DIAGNOSIS — I130 Hypertensive heart and chronic kidney disease with heart failure and stage 1 through stage 4 chronic kidney disease, or unspecified chronic kidney disease: Secondary | ICD-10-CM | POA: Diagnosis not present

## 2019-12-01 NOTE — Progress Notes (Signed)

## 2019-12-07 DIAGNOSIS — N185 Chronic kidney disease, stage 5: Secondary | ICD-10-CM | POA: Diagnosis not present

## 2019-12-07 DIAGNOSIS — I120 Hypertensive chronic kidney disease with stage 5 chronic kidney disease or end stage renal disease: Secondary | ICD-10-CM | POA: Diagnosis not present

## 2019-12-07 DIAGNOSIS — N2581 Secondary hyperparathyroidism of renal origin: Secondary | ICD-10-CM | POA: Diagnosis not present

## 2019-12-07 DIAGNOSIS — D631 Anemia in chronic kidney disease: Secondary | ICD-10-CM | POA: Diagnosis not present

## 2019-12-07 DIAGNOSIS — E1122 Type 2 diabetes mellitus with diabetic chronic kidney disease: Secondary | ICD-10-CM | POA: Diagnosis not present

## 2019-12-07 DIAGNOSIS — R809 Proteinuria, unspecified: Secondary | ICD-10-CM | POA: Diagnosis not present

## 2019-12-08 ENCOUNTER — Other Ambulatory Visit: Admission: RE | Admit: 2019-12-08 | Payer: Medicare HMO | Source: Ambulatory Visit

## 2019-12-09 ENCOUNTER — Inpatient Hospital Stay: Payer: Medicare HMO | Attending: Internal Medicine

## 2019-12-09 ENCOUNTER — Inpatient Hospital Stay: Payer: Medicare HMO

## 2019-12-09 ENCOUNTER — Other Ambulatory Visit: Payer: Self-pay

## 2019-12-09 VITALS — BP 148/86 | HR 76 | Temp 97.0°F | Resp 18

## 2019-12-09 DIAGNOSIS — M25569 Pain in unspecified knee: Secondary | ICD-10-CM | POA: Diagnosis not present

## 2019-12-09 DIAGNOSIS — C7951 Secondary malignant neoplasm of bone: Secondary | ICD-10-CM | POA: Insufficient documentation

## 2019-12-09 DIAGNOSIS — Z7982 Long term (current) use of aspirin: Secondary | ICD-10-CM | POA: Insufficient documentation

## 2019-12-09 DIAGNOSIS — C50212 Malignant neoplasm of upper-inner quadrant of left female breast: Secondary | ICD-10-CM | POA: Diagnosis not present

## 2019-12-09 DIAGNOSIS — Z8042 Family history of malignant neoplasm of prostate: Secondary | ICD-10-CM | POA: Insufficient documentation

## 2019-12-09 DIAGNOSIS — R2689 Other abnormalities of gait and mobility: Secondary | ICD-10-CM | POA: Diagnosis not present

## 2019-12-09 DIAGNOSIS — F329 Major depressive disorder, single episode, unspecified: Secondary | ICD-10-CM | POA: Diagnosis not present

## 2019-12-09 DIAGNOSIS — Z8249 Family history of ischemic heart disease and other diseases of the circulatory system: Secondary | ICD-10-CM | POA: Insufficient documentation

## 2019-12-09 DIAGNOSIS — Z803 Family history of malignant neoplasm of breast: Secondary | ICD-10-CM | POA: Diagnosis not present

## 2019-12-09 DIAGNOSIS — Z923 Personal history of irradiation: Secondary | ICD-10-CM | POA: Insufficient documentation

## 2019-12-09 DIAGNOSIS — R918 Other nonspecific abnormal finding of lung field: Secondary | ICD-10-CM | POA: Diagnosis not present

## 2019-12-09 DIAGNOSIS — J45909 Unspecified asthma, uncomplicated: Secondary | ICD-10-CM | POA: Insufficient documentation

## 2019-12-09 DIAGNOSIS — I509 Heart failure, unspecified: Secondary | ICD-10-CM | POA: Insufficient documentation

## 2019-12-09 DIAGNOSIS — Z79899 Other long term (current) drug therapy: Secondary | ICD-10-CM | POA: Insufficient documentation

## 2019-12-09 DIAGNOSIS — I110 Hypertensive heart disease with heart failure: Secondary | ICD-10-CM | POA: Diagnosis not present

## 2019-12-09 DIAGNOSIS — Z90710 Acquired absence of both cervix and uterus: Secondary | ICD-10-CM | POA: Insufficient documentation

## 2019-12-09 DIAGNOSIS — D649 Anemia, unspecified: Secondary | ICD-10-CM | POA: Insufficient documentation

## 2019-12-09 DIAGNOSIS — Z801 Family history of malignant neoplasm of trachea, bronchus and lung: Secondary | ICD-10-CM | POA: Insufficient documentation

## 2019-12-09 DIAGNOSIS — Z170 Estrogen receptor positive status [ER+]: Secondary | ICD-10-CM | POA: Diagnosis not present

## 2019-12-09 DIAGNOSIS — Z5111 Encounter for antineoplastic chemotherapy: Secondary | ICD-10-CM | POA: Diagnosis not present

## 2019-12-09 DIAGNOSIS — N184 Chronic kidney disease, stage 4 (severe): Secondary | ICD-10-CM | POA: Insufficient documentation

## 2019-12-09 DIAGNOSIS — Z8349 Family history of other endocrine, nutritional and metabolic diseases: Secondary | ICD-10-CM | POA: Insufficient documentation

## 2019-12-09 DIAGNOSIS — F419 Anxiety disorder, unspecified: Secondary | ICD-10-CM | POA: Insufficient documentation

## 2019-12-09 DIAGNOSIS — Z7951 Long term (current) use of inhaled steroids: Secondary | ICD-10-CM | POA: Insufficient documentation

## 2019-12-09 DIAGNOSIS — Z833 Family history of diabetes mellitus: Secondary | ICD-10-CM | POA: Insufficient documentation

## 2019-12-09 DIAGNOSIS — Z800 Family history of malignant neoplasm of digestive organs: Secondary | ICD-10-CM | POA: Insufficient documentation

## 2019-12-09 DIAGNOSIS — Z87891 Personal history of nicotine dependence: Secondary | ICD-10-CM | POA: Diagnosis not present

## 2019-12-09 DIAGNOSIS — Z794 Long term (current) use of insulin: Secondary | ICD-10-CM | POA: Diagnosis not present

## 2019-12-09 DIAGNOSIS — Z7189 Other specified counseling: Secondary | ICD-10-CM

## 2019-12-09 DIAGNOSIS — Z95828 Presence of other vascular implants and grafts: Secondary | ICD-10-CM

## 2019-12-09 LAB — COMPREHENSIVE METABOLIC PANEL
ALT: 17 U/L (ref 0–44)
AST: 19 U/L (ref 15–41)
Albumin: 3.7 g/dL (ref 3.5–5.0)
Alkaline Phosphatase: 53 U/L (ref 38–126)
Anion gap: 11 (ref 5–15)
BUN: 42 mg/dL — ABNORMAL HIGH (ref 8–23)
CO2: 21 mmol/L — ABNORMAL LOW (ref 22–32)
Calcium: 8.7 mg/dL — ABNORMAL LOW (ref 8.9–10.3)
Chloride: 103 mmol/L (ref 98–111)
Creatinine, Ser: 2.97 mg/dL — ABNORMAL HIGH (ref 0.44–1.00)
GFR calc Af Amer: 19 mL/min — ABNORMAL LOW (ref >60)
GFR calc non Af Amer: 16 mL/min — ABNORMAL LOW (ref >60)
Glucose, Bld: 258 mg/dL — ABNORMAL HIGH (ref 70–99)
Potassium: 4.1 mmol/L (ref 3.5–5.1)
Sodium: 135 mmol/L (ref 135–145)
Total Bilirubin: 0.6 mg/dL (ref 0.3–1.2)
Total Protein: 7.1 g/dL (ref 6.5–8.1)

## 2019-12-09 LAB — CBC WITH DIFFERENTIAL/PLATELET
Abs Immature Granulocytes: 0.07 10*3/uL (ref 0.00–0.07)
Basophils Absolute: 0.1 10*3/uL (ref 0.0–0.1)
Basophils Relative: 1 %
Eosinophils Absolute: 0.1 10*3/uL (ref 0.0–0.5)
Eosinophils Relative: 2 %
HCT: 28.3 % — ABNORMAL LOW (ref 36.0–46.0)
Hemoglobin: 9.6 g/dL — ABNORMAL LOW (ref 12.0–15.0)
Immature Granulocytes: 1 %
Lymphocytes Relative: 11 %
Lymphs Abs: 0.8 10*3/uL (ref 0.7–4.0)
MCH: 32.5 pg (ref 26.0–34.0)
MCHC: 33.9 g/dL (ref 30.0–36.0)
MCV: 95.9 fL (ref 80.0–100.0)
Monocytes Absolute: 0.7 10*3/uL (ref 0.1–1.0)
Monocytes Relative: 9 %
Neutro Abs: 5.8 10*3/uL (ref 1.7–7.7)
Neutrophils Relative %: 76 %
Platelets: 323 10*3/uL (ref 150–400)
RBC: 2.95 MIL/uL — ABNORMAL LOW (ref 3.87–5.11)
RDW: 13.2 % (ref 11.5–15.5)
WBC: 7.5 10*3/uL (ref 4.0–10.5)
nRBC: 0 % (ref 0.0–0.2)

## 2019-12-09 MED ORDER — SODIUM CHLORIDE 0.9 % IV SOLN
Freq: Once | INTRAVENOUS | Status: AC
  Filled 2019-12-09: qty 250

## 2019-12-09 MED ORDER — HEPARIN SOD (PORK) LOCK FLUSH 100 UNIT/ML IV SOLN
500.0000 [IU] | Freq: Once | INTRAVENOUS | Status: AC | PRN
  Administered 2019-12-09: 500 [IU]
  Filled 2019-12-09: qty 5

## 2019-12-09 MED ORDER — HEPARIN SOD (PORK) LOCK FLUSH 100 UNIT/ML IV SOLN
INTRAVENOUS | Status: AC
  Filled 2019-12-09: qty 5

## 2019-12-09 MED ORDER — SODIUM CHLORIDE 0.9 % IV SOLN
2.0000 mg | Freq: Once | INTRAVENOUS | Status: AC
  Administered 2019-12-09: 2 mg via INTRAVENOUS
  Filled 2019-12-09: qty 4

## 2019-12-09 MED ORDER — PROCHLORPERAZINE MALEATE 10 MG PO TABS
10.0000 mg | ORAL_TABLET | Freq: Once | ORAL | Status: AC
  Administered 2019-12-09: 10 mg via ORAL
  Filled 2019-12-09: qty 1

## 2019-12-09 MED ORDER — SODIUM CHLORIDE 0.9% FLUSH
10.0000 mL | INTRAVENOUS | Status: DC | PRN
  Administered 2019-12-09: 10 mL via INTRAVENOUS
  Filled 2019-12-09: qty 10

## 2019-12-12 ENCOUNTER — Other Ambulatory Visit: Payer: Self-pay

## 2019-12-12 ENCOUNTER — Ambulatory Visit
Admission: RE | Admit: 2019-12-12 | Discharge: 2019-12-12 | Disposition: A | Discharge status: Home / Self Care | Payer: Medicare HMO | Source: Ambulatory Visit | Attending: Internal Medicine | Admitting: Internal Medicine

## 2019-12-12 ENCOUNTER — Ambulatory Visit: Admission: RE | Admit: 2019-12-12 | Payer: Medicare HMO | Source: Home / Self Care | Admitting: Orthopedic Surgery

## 2019-12-12 ENCOUNTER — Encounter: Admission: RE | Payer: Self-pay | Source: Home / Self Care

## 2019-12-12 DIAGNOSIS — C50212 Malignant neoplasm of upper-inner quadrant of left female breast: Secondary | ICD-10-CM | POA: Diagnosis not present

## 2019-12-12 DIAGNOSIS — I700 Atherosclerosis of aorta: Secondary | ICD-10-CM | POA: Insufficient documentation

## 2019-12-12 DIAGNOSIS — R918 Other nonspecific abnormal finding of lung field: Secondary | ICD-10-CM | POA: Insufficient documentation

## 2019-12-12 DIAGNOSIS — C7951 Secondary malignant neoplasm of bone: Secondary | ICD-10-CM | POA: Insufficient documentation

## 2019-12-12 DIAGNOSIS — R911 Solitary pulmonary nodule: Secondary | ICD-10-CM | POA: Diagnosis not present

## 2019-12-12 DIAGNOSIS — Z170 Estrogen receptor positive status [ER+]: Secondary | ICD-10-CM | POA: Insufficient documentation

## 2019-12-12 DIAGNOSIS — C50919 Malignant neoplasm of unspecified site of unspecified female breast: Secondary | ICD-10-CM | POA: Diagnosis not present

## 2019-12-12 DIAGNOSIS — Z79899 Other long term (current) drug therapy: Secondary | ICD-10-CM | POA: Insufficient documentation

## 2019-12-12 DIAGNOSIS — J90 Pleural effusion, not elsewhere classified: Secondary | ICD-10-CM | POA: Diagnosis not present

## 2019-12-12 LAB — GLUCOSE, CAPILLARY: Glucose-Capillary: 118 mg/dL — ABNORMAL HIGH (ref 70–99)

## 2019-12-12 SURGERY — ARTHROSCOPY, KNEE, WITH MEDIAL MENISCECTOMY
Anesthesia: General | Site: Knee | Laterality: Right

## 2019-12-12 MED ORDER — FLUDEOXYGLUCOSE F - 18 (FDG) INJECTION
11.0000 | Freq: Once | INTRAVENOUS | Status: AC | PRN
  Administered 2019-12-12: 11 via INTRAVENOUS

## 2019-12-13 ENCOUNTER — Telehealth: Payer: Self-pay | Admitting: Internal Medicine

## 2019-12-13 NOTE — Telephone Encounter (Signed)
On 5/10 -I spoke to patient regarding results of the PET scan overall stable disease.  Okay from heme-onc standpoint to go proceed with knee arthroscopic surgery [as previously discussed with Dr. Harlow Mares.  Patient will reach out to Dr. Harlow Mares office.

## 2019-12-14 ENCOUNTER — Telehealth: Payer: Self-pay | Admitting: *Deleted

## 2019-12-14 NOTE — Telephone Encounter (Signed)
Patient called reporting that she is scheduled for chemotherapy in the 21st and just got a call from ortho who wants to do her knee surgery on the 20th. She is asking if this would a conflict or not and would like to know if Dr B thinks it would be ok for surgery then have her chemotherapy the next day. Please advise

## 2019-12-15 NOTE — Telephone Encounter (Signed)
H- I spoke to pt yesterday; Ok to re-schedule her appts [MD; ;abs; chemo]; to 5/27.  Thanks GB

## 2019-12-15 NOTE — Telephone Encounter (Signed)
Dr. Jacinto Reap - do we need to cnl chemo next week?

## 2019-12-20 DIAGNOSIS — L89899 Pressure ulcer of other site, unspecified stage: Secondary | ICD-10-CM | POA: Diagnosis not present

## 2019-12-21 DIAGNOSIS — R300 Dysuria: Secondary | ICD-10-CM | POA: Diagnosis not present

## 2019-12-22 NOTE — Progress Notes (Signed)
Pharmacist Chemotherapy Monitoring - Follow Up Assessment    I verify that I have reviewed each item in the below checklist:  . Regimen for the patient is scheduled for the appropriate day and plan matches scheduled date. Marland Kitchen Appropriate non-routine labs are ordered dependent on drug ordered. . If applicable, additional medications reviewed and ordered per protocol based on lifetime cumulative doses and/or treatment regimen.   Plan for follow-up and/or issues identified: No . I-vent associated with next due treatment: No . MD and/or nursing notified: No  Cristel Rail K 12/22/2019 8:33 AM

## 2019-12-23 ENCOUNTER — Inpatient Hospital Stay: Payer: Medicare HMO

## 2019-12-23 ENCOUNTER — Inpatient Hospital Stay: Payer: Medicare HMO | Admitting: Internal Medicine

## 2019-12-27 ENCOUNTER — Encounter: Payer: Medicare HMO | Attending: Physician Assistant | Admitting: Physician Assistant

## 2019-12-27 ENCOUNTER — Other Ambulatory Visit: Payer: Self-pay | Admitting: Physician Assistant

## 2019-12-27 ENCOUNTER — Ambulatory Visit
Admission: RE | Admit: 2019-12-27 | Discharge: 2019-12-27 | Disposition: A | Discharge status: Home / Self Care | Payer: Medicare HMO | Source: Ambulatory Visit | Attending: Physician Assistant | Admitting: Physician Assistant

## 2019-12-27 ENCOUNTER — Other Ambulatory Visit: Payer: Self-pay

## 2019-12-27 DIAGNOSIS — S81801A Unspecified open wound, right lower leg, initial encounter: Secondary | ICD-10-CM | POA: Insufficient documentation

## 2019-12-27 DIAGNOSIS — L97511 Non-pressure chronic ulcer of other part of right foot limited to breakdown of skin: Secondary | ICD-10-CM | POA: Insufficient documentation

## 2019-12-27 DIAGNOSIS — J45909 Unspecified asthma, uncomplicated: Secondary | ICD-10-CM | POA: Insufficient documentation

## 2019-12-27 DIAGNOSIS — I5042 Chronic combined systolic (congestive) and diastolic (congestive) heart failure: Secondary | ICD-10-CM | POA: Insufficient documentation

## 2019-12-27 DIAGNOSIS — Z8349 Family history of other endocrine, nutritional and metabolic diseases: Secondary | ICD-10-CM | POA: Diagnosis not present

## 2019-12-27 DIAGNOSIS — Z8249 Family history of ischemic heart disease and other diseases of the circulatory system: Secondary | ICD-10-CM | POA: Insufficient documentation

## 2019-12-27 DIAGNOSIS — Z809 Family history of malignant neoplasm, unspecified: Secondary | ICD-10-CM | POA: Insufficient documentation

## 2019-12-27 DIAGNOSIS — Z881 Allergy status to other antibiotic agents status: Secondary | ICD-10-CM | POA: Diagnosis not present

## 2019-12-27 DIAGNOSIS — Z833 Family history of diabetes mellitus: Secondary | ICD-10-CM | POA: Diagnosis not present

## 2019-12-27 DIAGNOSIS — E11621 Type 2 diabetes mellitus with foot ulcer: Secondary | ICD-10-CM | POA: Insufficient documentation

## 2019-12-27 DIAGNOSIS — S62616D Displaced fracture of proximal phalanx of right little finger, subsequent encounter for fracture with routine healing: Secondary | ICD-10-CM | POA: Diagnosis not present

## 2019-12-27 DIAGNOSIS — Z836 Family history of other diseases of the respiratory system: Secondary | ICD-10-CM | POA: Insufficient documentation

## 2019-12-27 DIAGNOSIS — I110 Hypertensive heart disease with heart failure: Secondary | ICD-10-CM | POA: Insufficient documentation

## 2019-12-27 DIAGNOSIS — Z87891 Personal history of nicotine dependence: Secondary | ICD-10-CM | POA: Diagnosis not present

## 2019-12-27 NOTE — Progress Notes (Signed)
[name], [name] ([id number]) Visit Report for 12/27/2019 Allergy List Details Patient Name: [name], [name] Date of Service: 12/27/2019 9:45 AM Medical Record Number: [id number] Patient Account Number: [id number] Date of Birth/Sex: 07-Oct-1955 (64 y.o. F) Treating RN: Army Melia Primary Care Provider: [name] Other Clinician: Referring Provider: Carlynn Spry Treating Provider/Extender: STONE III, HOYT Weeks in Treatment: 0 Allergies Active Allergies Bactrim Allergy Notes Electronic Signature(s) Signed: 12/27/2019 11:41:03 AM By: Army Melia Entered By: Army Melia on 12/27/2019 10:09:59 [name], [name] -------------------------------------------------------------------------------- Arrival Information Details Patient Name: Gloria Both A. Date of Service: 12/27/2019 9:45 AM Medical Record Number: [id number] Patient Account Number: [id number] Date of Birth/Sex: 17-Jan-1956 (64 y.o. F) Treating RN: Army Melia Primary Care Provider: [name] Other Clinician: Referring Provider: Carlynn Spry Treating Provider/Extender: Melburn Hake, HOYT Weeks in Treatment: 0 Visit Information Patient Arrived: Wheel Chair Arrival Time: 10:07 Accompanied By: husband Transfer Assistance: None Patient Identification Verified: Yes Patient Has Alerts: Yes Patient Alerts: DMII Electronic Signature(s) Signed: 12/27/2019 12:56:57 PM By: Montey Hora Entered By: Montey Hora on 12/27/2019 10:35:09 [name], [name] -------------------------------------------------------------------------------- Clinic Level of Care Assessment Details Patient Name: Gloria Both A. Date of Service: 12/27/2019 9:45 AM Medical Record Number: [id number] Patient Account Number: [id number] Date of Birth/Sex: 21-Mar-1956 (64 y.o. F) Treating RN: Montey Hora Primary Care Provider: [name] Other Clinician: Referring Provider: Carlynn Spry Treating Provider/Extender:  Melburn Hake, HOYT Weeks in Treatment: 0 Clinic Level of Care Assessment Items TOOL 2 Quantity Score []  - Use when only an EandM is performed on the INITIAL visit 0 ASSESSMENTS - Nursing Assessment / Reassessment X - General Physical Exam (combine w/ comprehensive assessment (listed just below) when performed on new 1 20 pt. evals) X- 1 25 Comprehensive Assessment (HX, ROS, Risk Assessments, Wounds Hx, etc.) ASSESSMENTS - Wound and Skin Assessment / Reassessment X - Simple Wound Assessment / Reassessment - one wound 1 5 []  - 0 Complex Wound Assessment / Reassessment - multiple wounds []  - 0 Dermatologic / Skin Assessment (not related to wound area) ASSESSMENTS - Ostomy and/or Continence Assessment and Care []  - Incontinence Assessment and Management 0 []  - 0 Ostomy Care Assessment and Management (repouching, etc.) PROCESS - Coordination of Care X - Simple Patient / Family Education for ongoing care 1 15 []  - 0 Complex (extensive) Patient / Family Education for ongoing care X- 1 10 Staff obtains Programmer, systems, Records, Test Results / Process Orders []  - 0 Staff telephones HHA, Nursing Homes / Clarify orders / etc []  - 0 Routine Transfer to another Facility (non-emergent condition) []  - 0 Routine Hospital Admission (non-emergent condition) X- 1 15 New Admissions / Biomedical engineer / Ordering NPWT, Apligraf, etc. []  - 0 Emergency Hospital Admission (emergent condition) X- 1 10 Simple Discharge Coordination []  - 0 Complex (extensive) Discharge Coordination PROCESS - Special Needs []  - Pediatric / Minor Patient Management 0 []  - 0 Isolation Patient Management []  - 0 Hearing / Language / Visual special needs []  - 0 Assessment of Community assistance (transportation, D/C planning, etc.) []  - 0 Additional assistance / Altered mentation []  - 0 Support Surface(s) Assessment (bed, cushion, seat, etc.) INTERVENTIONS - Wound Cleansing / Measurement X - Wound Imaging  (photographs - any number of wounds) 1 5 []  - 0 Wound Tracing (instead of photographs) X- 1 5 Simple Wound Measurement - one wound []  - 0 Complex Wound Measurement - multiple wounds [name], [name] ([id number]) X- 1 5 Simple Wound Cleansing - one wound []  - 0 Complex  Wound Cleansing - multiple wounds INTERVENTIONS - Wound Dressings X - Small Wound Dressing one or multiple wounds 1 10 []  - 0 Medium Wound Dressing one or multiple wounds []  - 0 Large Wound Dressing one or multiple wounds []  - 0 Application of Medications - injection INTERVENTIONS - Miscellaneous []  - External ear exam 0 []  - 0 Specimen Collection (cultures, biopsies, blood, body fluids, etc.) []  - 0 Specimen(s) / Culture(s) sent or taken to Lab for analysis []  - 0 Patient Transfer (multiple staff / Harrel Lemon Lift / Similar devices) []  - 0 Simple Staple / Suture removal (25 or less) []  - 0 Complex Staple / Suture removal (26 or more) []  - 0 Hypo / Hyperglycemic Management (close monitor of Blood Glucose) X- 1 15 Ankle / Brachial Index (ABI) - do not check if billed separately Has the patient been seen at the hospital within the last three years: Yes Total Score: 140 Level Of Care: New/Established - Level 4 Electronic Signature(s) Signed: 12/27/2019 12:56:57 PM By: Montey Hora Entered By: Montey Hora on 12/27/2019 11:13:51 [name], [name] -------------------------------------------------------------------------------- Encounter Discharge Information Details Patient Name: Gloria Both A. Date of Service: 12/27/2019 9:45 AM Medical Record Number: [id number] Patient Account Number: [id number] Date of Birth/Sex: [birth year] (64 y.o. F) Treating RN: Montey Hora Primary Care Provider: [name] Other Clinician: Referring Provider: Carlynn Spry Treating Provider/Extender: Melburn Hake, HOYT Weeks in Treatment: 0 Encounter Discharge Information Items Discharge Condition:  Stable Ambulatory Status: Wheelchair Discharge Destination: Home Transportation: Private Auto Accompanied By: husband Schedule Follow-up Appointment: Yes Clinical Summary of Care: Electronic Signature(s) Signed: 12/27/2019 12:56:57 PM By: Montey Hora Entered By: Montey Hora on 12/27/2019 11:14:54 Goodrich, [name] ([id number]) -------------------------------------------------------------------------------- Lower Extremity Assessment Details Patient Name: Gloria Both A. Date of Service: 12/27/2019 9:45 AM Medical Record Number: [id number] Patient Account Number: [id number] Date of Birth/Sex: [birth year] (64 y.o. F) Treating RN: Army Melia Primary Care Provider: [name] Other Clinician: Referring Provider: Carlynn Spry Treating Provider/Extender: Melburn Hake, HOYT Weeks in Treatment: 0 Edema Assessment Assessed: [Left: No] [Right: No] [Left: Edema] [Right: :] Calf Left: Right: Point of Measurement: 28 cm From Medial Instep 47 cm 48 cm Ankle Left: Right: Point of Measurement: 9 cm From Medial Instep 28 cm 28 cm Vascular Assessment Pulses: Dorsalis Pedis Palpable: [Left:Yes] [Right:Yes] Doppler Audible: [Right:Yes] Posterior Tibial Palpable: [Right:Yes Yes] Notes Right leg greater than 220 Electronic Signature(s) Signed: 12/27/2019 11:41:03 AM By: Army Melia Entered By: Army Melia on 12/27/2019 10:24:21 Weideman, Sherl AMarland Kitchen ([id number]) -------------------------------------------------------------------------------- Multi Wound Chart Details Patient Name: Gloria Both A. Date of Service: 12/27/2019 9:45 AM Medical Record Number: [id number] Patient Account Number: [id number] Date of Birth/Sex: [birth year] (64 y.o. F) Treating RN: Montey Hora Primary Care Provider: [name] Other Clinician: Referring Provider: Carlynn Spry Treating Provider/Extender: STONE III, HOYT Weeks in Treatment: 0 Vital Signs Height(in): 62 Pulse(bpm): 75 Weight(lbs):  195 Blood Pressure(mmHg): 135/76 Body Mass Index(BMI): 36 Temperature(F): 97.8 Respiratory Rate(breaths/min): 16 Photos: [N/A:N/A] Wound Location: Right, Medial Foot N/A N/A Wounding Event: Blister N/A N/A Primary Etiology: Diabetic Wound/Ulcer of the Lower N/A N/A Extremity Comorbid History: Cataracts, Asthma, Congestive N/A N/A Heart Failure, Hypertension, Type II Diabetes, Received Chemotherapy, Received Radiation Date Acquired: 12/20/2019 N/A N/A Weeks of Treatment: 0 N/A N/A Wound Status: Open N/A N/A Measurements L x W x D (cm) 5x4.5x0.1 N/A N/A Area (cm) : 17.671 N/A N/A Volume (cm) : 1.767 N/A N/A Classification: Grade 2 N/A N/A Exudate Amount: Medium N/A N/A Exudate Type: Serosanguineous N/A N/A Exudate Color:  red, brown N/A N/A Wound Margin: Flat and Intact N/A N/A Granulation Amount: None Present (0%) N/A N/A Necrotic Amount: Large (67-100%) N/A N/A Exposed Structures: Fascia: No N/A N/A Fat Layer (Subcutaneous Tissue) Exposed: No Tendon: No Muscle: No Joint: No Bone: No Epithelialization: None N/A N/A Treatment Notes Electronic Signature(s) Signed: 12/27/2019 12:56:57 PM By: Montey Hora Entered By: Montey Hora on 12/27/2019 10:34:57 [name], [name] ([id number]) -------------------------------------------------------------------------------- [place] Details Patient Name: Gloria Both A. Date of Service: 12/27/2019 9:45 AM Medical Record Number: [id number] Patient Account Number: [id number] Date of Birth/Sex: 04/07/1956 (64 y.o. F) Treating RN: Montey Hora Primary Care Provider: [name] Other Clinician: Referring Provider: Carlynn Spry Treating Provider/Extender: Melburn Hake, HOYT Weeks in Treatment: 0 Active Inactive Abuse / Safety / Falls / Self Care Management Nursing Diagnoses: Potential for falls Goals: Patient will not experience any injury related to falls Date Initiated: 12/27/2019 Target Resolution  Date: 03/10/2020 Goal Status: Active Interventions: Assess fall risk on admission and as needed Notes: Nutrition Nursing Diagnoses: Impaired glucose control: actual or potential Goals: Patient/caregiver agrees to and verbalizes understanding of need to use nutritional supplements and/or vitamins as prescribed Date Initiated: 12/27/2019 Target Resolution Date: 03/10/2020 Goal Status: Active Interventions: Assess patient nutrition upon admission and as needed per policy Notes: Orientation to the Wound Care Program Nursing Diagnoses: Knowledge deficit related to the wound healing center program Goals: Patient/caregiver will verbalize understanding of the [place] Program Date Initiated: 12/27/2019 Target Resolution Date: 03/10/2020 Goal Status: Active Interventions: Provide education on orientation to the wound center Notes: Wound/Skin Impairment Nursing Diagnoses: Impaired tissue integrity Goals: Ulcer/skin breakdown will heal within 14 weeks Date Initiated: 12/27/2019 Target Resolution Date: 03/10/2020 Goal Status: Active [name], [name] ([id number]) Interventions: Assess patient/caregiver ability to obtain necessary supplies Assess patient/caregiver ability to perform ulcer/skin care regimen upon admission and as needed Assess ulceration(s) every visit Notes: Electronic Signature(s) Signed: 12/27/2019 12:56:57 PM By: Montey Hora Entered By: Montey Hora on 12/27/2019 10:33:00 [name], [name] ([id number]) -------------------------------------------------------------------------------- Pain Assessment Details Patient Name: Gloria Both A. Date of Service: 12/27/2019 9:45 AM Medical Record Number: [id number] Patient Account Number: [id number] Date of Birth/Sex: 06/26/1956 (64 y.o. F) Treating RN: Army Melia Primary Care Provider: [name] Other Clinician: Referring Provider: Carlynn Spry Treating Provider/Extender: Melburn Hake, HOYT Weeks in Treatment:  0 Active Problems Location of Pain Severity and Description of Pain Patient Has Paino Yes Site Locations Pain Location: Pain in Ulcers Rate the pain. Current Pain Level: 6 Pain Management and Medication Current Pain Management: Electronic Signature(s) Signed: 12/27/2019 11:41:03 AM By: Army Melia Entered By: Army Melia on 12/27/2019 10:08:20 [name] ([id number]) -------------------------------------------------------------------------------- Patient/Caregiver Education Details Patient Name: Gloria Both A. Date of Service: 12/27/2019 9:45 AM Medical Record Number: [id number] Patient Account Number: [id number] Date of Birth/Gender: 1955/12/28 (64 y.o. F) Treating RN: Montey Hora Primary Care Physician: [name] Other Clinician: Referring Physician: Carlynn Spry Treating Physician/Extender: Melburn Hake, HOYT Weeks in Treatment: 0 Education Assessment Education Provided To: Patient and Caregiver Education Topics Provided Wound/Skin Impairment: Handouts: Other: wound care and reportable s/s Methods: Demonstration, Explain/Verbal Responses: State content correctly Electronic Signature(s) Signed: 12/27/2019 12:56:57 PM By: Montey Hora Entered By: Montey Hora on 12/27/2019 11:14:17 [name], [name] ([id number]) -------------------------------------------------------------------------------- Wound Assessment Details Patient Name: Gloria Both A. Date of Service: 12/27/2019 9:45 AM Medical Record Number: [id number] Patient Account Number: [id number] Date of Birth/Sex: 04/30/1956 (64 y.o. F) Treating RN: Army Melia Primary Care Provider: [name] Other Clinician: Referring Provider: Carlynn Spry Treating  Provider/Extender: STONE III, HOYT Weeks in Treatment: 0 Wound Status Wound Number: 1 Primary Diabetic Wound/Ulcer of the Lower Extremity Etiology: Wound Location: Right, Medial Foot Wound Open Wounding Event: Blister Status: Date  Acquired: 12/20/2019 Comorbid Cataracts, Asthma, Congestive Heart Failure, Weeks Of Treatment: 0 History: Hypertension, Type II Diabetes, Received Chemotherapy, Clustered Wound: No Received Radiation Photos Wound Measurements Length: (cm) 5 % Width: (cm) 4.5 % Depth: (cm) 0.1 E Area: (cm) 17.671 Volume: (cm) 1.767 Reduction in Area: Reduction in Volume: pithelialization: None Tunneling: No Undermining: No Wound Description Classification: Grade 2 Wound Margin: Flat and Intact Exudate Amount: Medium Exudate Type: Serosanguineous Exudate Color: red, brown Foul Odor After Cleansing: No Slough/Fibrino Yes Wound Bed Granulation Amount: None Present (0%) Exposed Structure Necrotic Amount: Large (67-100%) Fascia Exposed: No Necrotic Quality: Adherent Slough Fat Layer (Subcutaneous Tissue) Exposed: No Tendon Exposed: No Muscle Exposed: No Joint Exposed: No Bone Exposed: No Treatment Notes Wound #1 (Right, Medial Foot) Notes abd, kerlix and tubigrip g Electronic Signature(s) [name], [name] ([id number]) Signed: 12/27/2019 11:41:03 AM By: Army Melia Entered By: Army Melia on 12/27/2019 10:20:17 [name], [name] ([id number]) -------------------------------------------------------------------------------- [place] Details Patient Name: Gloria Both A. Date of Service: 12/27/2019 9:45 AM Medical Record Number: [id number] Patient Account Number: [id number] Date of Birth/Sex: 04/06/1956 (64 y.o. F) Treating RN: Army Melia Primary Care Provider: [name] Other Clinician: Referring Provider: Carlynn Spry Treating Provider/Extender: Melburn Hake, HOYT Weeks in Treatment: 0 Vital Signs Time Taken: 10:08 Temperature (F): 97.8 Height (in): 62 Pulse (bpm): 76 Source: Stated Respiratory Rate (breaths/min): 16 Weight (lbs): 195 Blood Pressure (mmHg): 135/76 Source: Stated Reference Range: 80 - 120 mg / dl Body Mass Index (BMI): 35.7 Electronic  Signature(s) Signed: 12/27/2019 11:41:03 AM By: Army Melia Entered By: Army Melia on 12/27/2019 10:09:44

## 2019-12-27 NOTE — Progress Notes (Signed)
JAMISYN, LANGER ([id number]) Visit Report for 12/27/2019 Abuse/Suicide Risk Screen Details Patient Name: [name], [name] Date of Service: 12/27/2019 9:45 AM Medical Record Number: [id number] Patient Account Number: [id number] Date of Birth/Sex: 09-16-1955 (64 y.o. F) Treating RN: Army Melia Primary Care Kaylon Laroche: [name] Other Clinician: Referring Juel Bellerose: Carlynn Spry Treating Mya Suell/Extender: Melburn Hake, HOYT Weeks in Treatment: 0 Abuse/Suicide Risk Screen Items Answer ABUSE RISK SCREEN: Has anyone close to you tried to hurt or harm you recentlyo No Do you feel uncomfortable with anyone in your familyo No Has anyone forced you do things that you didnot want to doo No Electronic Signature(s) Signed: 12/27/2019 11:41:03 AM By: Army Melia Entered By: Army Melia on 12/27/2019 10:15:50 [name], Gloria AMarland Kitchen ([id number]) -------------------------------------------------------------------------------- Activities of Daily Living Details Patient Name: Gloria Both A. Date of Service: 12/27/2019 9:45 AM Medical Record Number: [id number] Patient Account Number: [id number] Date of Birth/Sex: 11-08-1955 (64 y.o. F) Treating RN: Army Melia Primary Care Maebel Marasco: [name] Other Clinician: Referring Makhayla Mcmurry: Carlynn Spry Treating Keiyana Stehr/Extender: Melburn Hake, HOYT Weeks in Treatment: 0 Activities of Daily Living Items Answer Activities of Daily Living (Please select one for each item) Drive Automobile Not Able Take Medications Completely Able Use Telephone Completely Able Care for Appearance Completely Able Use Toilet Completely Able Bath / Shower Completely Able Dress Self Need Assistance Feed Self Completely Able Walk Need Assistance Get In / Out Bed Completely Able Housework Need Assistance Prepare Meals Completely [place] for Self Completely Able Electronic Signature(s) Signed: 12/27/2019 11:41:03 AM By: Army Melia Entered  By: Army Melia on 12/27/2019 10:16:19 [name], [name] ([id number]) -------------------------------------------------------------------------------- Education Screening Details Patient Name: Gloria Both A. Date of Service: 12/27/2019 9:45 AM Medical Record Number: [id number] Patient Account Number: [id number] Date of Birth/Sex: 1956-05-17 (64 y.o. F) Treating RN: Army Melia Primary Care Mariaclara Spear: [name] Other Clinician: Referring Terald Jump: Carlynn Spry Treating Dimitris Shanahan/Extender: Melburn Hake, HOYT Weeks in Treatment: 0 Primary Learner Assessed: Patient Learning Preferences/Education Level/Primary Language Learning Preference: Explanation Highest Education Level: College or Above Preferred Language: English Cognitive Barrier Language Barrier: No Translator Needed: No Memory Deficit: No Emotional Barrier: No Cultural/Religious Beliefs Affecting Medical Care: No Physical Barrier Impaired Vision: No Impaired Hearing: No Decreased Hand dexterity: No Knowledge/Comprehension Knowledge Level: High Comprehension Level: High Ability to understand written instructions: High Ability to understand verbal instructions: High Motivation Anxiety Level: Calm Cooperation: Cooperative Education Importance: Acknowledges Need Interest in Health Problems: Asks Questions Perception: Coherent Willingness to Engage in Self-Management High Activities: Readiness to Engage in Self-Management High Activities: Electronic Signature(s) Signed: 12/27/2019 11:41:03 AM By: Army Melia Entered By: Army Melia on 12/27/2019 10:16:39 [name], [name] ([id number]) -------------------------------------------------------------------------------- Fall Risk Assessment Details Patient Name: Gloria Both A. Date of Service: 12/27/2019 9:45 AM Medical Record Number: [id number] Patient Account Number: [id number] Date of Birth/Sex: [birth year] (64 y.o. F) Treating RN: Army Melia Primary Care  Rhiann Boucher: [name] Other Clinician: Referring Keshun Berrett: Carlynn Spry Treating Sayvion Vigen/Extender: Melburn Hake, HOYT Weeks in Treatment: 0 Fall Risk Assessment Items Have you had 2 or more falls in the last 12 monthso 0 Yes Have you had any fall that resulted in injury in the last 12 monthso 0 No FALLS RISK SCREEN History of falling - immediate or within 3 months 25 Yes Secondary diagnosis (Do you have 2 or more medical diagnoseso) 0 No Ambulatory aid None/bed rest/wheelchair/nurse 0 No Crutches/cane/walker 15 Yes Furniture 0 No Intravenous therapy Access/Saline/Heparin Lock 0 No Gait/Transferring Normal/ bed rest/ wheelchair 0 No Weak (short steps  with or without shuffle, stooped but able to lift head while walking, may 0 No seek support from furniture) Impaired (short steps with shuffle, may have difficulty arising from chair, head down, impaired 0 No balance) Mental Status Oriented to own ability 0 No Electronic Signature(s) Signed: 12/27/2019 11:41:03 AM By: Army Melia Entered By: Army Melia on 12/27/2019 10:16:49 [name], Gloria AMarland Kitchen ([id number]) -------------------------------------------------------------------------------- Foot Assessment Details Patient Name: Gloria Both A. Date of Service: 12/27/2019 9:45 AM Medical Record Number: [id number] Patient Account Number: [id number] Date of Birth/Sex: 05-Mar-1956 (64 y.o. F) Treating RN: Army Melia Primary Care Cabela Pacifico: [name] Other Clinician: Referring Josha Weekley: Carlynn Spry Treating Landyn Lorincz/Extender: Melburn Hake, HOYT Weeks in Treatment: 0 Foot Assessment Items Site Locations + = Sensation present, - = Sensation absent, C = Callus, U = Ulcer R = Redness, W = Warmth, M = Maceration, PU = Pre-ulcerative lesion F = Fissure, S = Swelling, D = Dryness Assessment Right: Left: Other Deformity: No No Prior Foot Ulcer: No No Prior Amputation: No No Charcot Joint: No No Ambulatory Status: Ambulatory With  Help Assistance Device: Walker Gait: Steady Electronic Signature(s) Signed: 12/27/2019 11:41:03 AM By: Army Melia Entered By: Army Melia on 12/27/2019 10:18:45 [name], Gloria AMarland Kitchen ([id number]) -------------------------------------------------------------------------------- Nutrition Risk Screening Details Patient Name: Gloria Both A. Date of Service: 12/27/2019 9:45 AM Medical Record Number: [id number] Patient Account Number: [id number] Date of Birth/Sex: [birth year] (64 y.o. F) Treating RN: Army Melia Primary Care Kemarion Abbey: [name] Other Clinician: Referring Kedric Bumgarner: Carlynn Spry Treating Danaija Eskridge/Extender: Melburn Hake, HOYT Weeks in Treatment: 0 Height (in): 62 Weight (lbs): 195 Body Mass Index (BMI): 35.7 Nutrition Risk Screening Items Score Screening NUTRITION RISK SCREEN: I have an illness or condition that made me change the kind and/or amount of food I eat 0 No I eat fewer than two meals per day 0 No I eat few fruits and vegetables, or milk products 0 No I have three or more drinks of beer, liquor or wine almost every day 0 No I have tooth or mouth problems that make it hard for me to eat 0 No I don't always have enough money to buy the food I need 0 No I eat alone most of the time 0 No I take three or more different prescribed or over-the-counter drugs a day 0 No Without wanting to, I have lost or gained 10 pounds in the last six months 0 No I am not always physically able to shop, cook and/or feed myself 0 No Nutrition Protocols Good Risk Protocol 0 No interventions needed Moderate Risk Protocol High Risk Proctocol Risk Level: Good Risk Score: 0 Electronic Signature(s) Signed: 12/27/2019 11:41:03 AM By: Army Melia Entered By: Army Melia on 12/27/2019 10:16:53

## 2019-12-27 NOTE — Progress Notes (Signed)
RAYSA, BOSAK ([id number]) Visit Report for 12/27/2019 Chief Complaint Document Details Patient Name: [name], [name] Date of Service: 12/27/2019 9:45 AM Medical Record Number: [id number] Patient Account Number: [id number] Date of Birth/Sex: [birth year] (64 y.o. F) Treating RN: Montey Hora Primary Care Provider: [name] Other Clinician: Referring Provider: Carlynn Spry Treating Provider/Extender: Melburn Hake, Tai Skelly Weeks in Treatment: 0 Information Obtained from: Patient Chief Complaint Right foot ulcer Electronic Signature(s) Signed: 12/27/2019 10:29:14 AM By: Worthy Keeler PA-C Entered By: Worthy Keeler on 12/27/2019 10:29:14 Armetta, [name] ([id number]) -------------------------------------------------------------------------------- HPI Details Patient Name: Gloria Both A. Date of Service: 12/27/2019 9:45 AM Medical Record Number: [id number] Patient Account Number: [id number] Date of Birth/Sex: 05-May-1956 (64 y.o. F) Treating RN: Montey Hora Primary Care Provider: [name] Other Clinician: Referring Provider: Carlynn Spry Treating Provider/Extender: Melburn Hake, Jerald Hennington Weeks in Treatment: 0 History of Present Illness HPI Description: 12/27/2019 upon evaluation today patient appears to be doing somewhat poorly upon initial inspection here in the clinic. She has unfortunately been having issues with for the past week a blister over her right heel that started on the plantar aspect and has spread to the medial aspect. She does have a history of diabetes mellitus type 2 she also has hypertension along with congestive heart failure. With that being said she was supposed to be having knee surgery on her right knee but this was postponed by the surgeon and she was referred to [place] due to the blister noted. Fortunately there is no signs of active infection at this time. With that being said I am concerned about the fact that this could develop into infection. Obviously  we want to prevent such from happening. She does note that she been placed on a antibiotic for urinary tract infection by her primary care provider she has that to pick up. I asked her to call and let us know what that antibiotic was so that we can put that in the chart. For that reason I am not can give her anything empirically at this point. The patient cannot remember any injury that she had to this region in fact she really does not know how this began at all other than the fact that it "just showed up". Electronic Signature(s) Signed: 12/27/2019 12:10:08 PM By: Worthy Keeler PA-C Entered By: Worthy Keeler on 12/27/2019 12:10:08 [name] ([id number]) -------------------------------------------------------------------------------- Physical Exam Details Patient Name: Gloria Both A. Date of Service: 12/27/2019 9:45 AM Medical Record Number: [id number] Patient Account Number: [id number] Date of Birth/Sex: May 16, 1956 (64 y.o. F) Treating RN: Montey Hora Primary Care Provider: [name] Other Clinician: Referring Provider: Carlynn Spry Treating Provider/Extender: STONE III, Kalli Greenfield Weeks in Treatment: 0 Constitutional patient is hypertensive.. pulse regular and within target range for patient.Marland Kitchen respirations regular, non-labored and within target range for patient.Marland Kitchen temperature within target range for patient.. Well-nourished and well-hydrated in no acute distress. Eyes conjunctiva clear no eyelid edema noted. pupils equal round and reactive to light and accommodation. Ears, Nose, Mouth, and Throat no gross abnormality of ear auricles or external auditory canals. normal hearing noted during conversation. mucus membranes moist. Respiratory normal breathing without difficulty. Cardiovascular 2+ dorsalis pedis/posterior tibialis pulses. 1+ pitting edema of the bilateral lower extremities. Musculoskeletal normal gait and posture. no significant deformity or arthritic  changes, no loss or range of motion, no clubbing. Psychiatric this patient is able to make decisions and demonstrates good insight into disease process. Alert and Oriented x 3. pleasant and cooperative. Notes Upon inspection patient  has a blister that appears to be potentially at least partially blood-filled on the heel towards the medial aspect of her right foot. This is not tender to touch does not appear to be warm to touch there is also no signs of erythema surrounding all of which is good news. With that being said fortunately there is no signs of active infection systemically either which is also good news though this is something we need to keep a close eye on in my opinion. She has good pulses but unfortunately her ABIs were noncompressible she is going require in my opinion formal testing for arterial status. Also I am to recommend an x-ray of her heel as she actually did have an MRI but this was of her knee I do not believe they ever x-rayed her foot/heel region. Electronic Signature(s) Signed: 12/27/2019 12:11:04 PM By: Worthy Keeler PA-C Entered By: Worthy Keeler on 12/27/2019 12:11:04 [name], [name] ([id number]) -------------------------------------------------------------------------------- Physician Orders Details Patient Name: Gloria Both A. Date of Service: 12/27/2019 9:45 AM Medical Record Number: [id number] Patient Account Number: [id number] Date of Birth/Sex: 12/09/1955 (64 y.o. F) Treating RN: Montey Hora Primary Care Provider: [name] Other Clinician: Referring Provider: Carlynn Spry Treating Provider/Extender: Melburn Hake, Alyne Martinson Weeks in Treatment: 0 Verbal / Phone Orders: No Diagnosis Coding ICD-10 Coding Code Description E11.621 Type 2 diabetes mellitus with foot ulcer L97.511 Non-pressure chronic ulcer of other part of right foot limited to breakdown of skin I10 Essential (primary) hypertension I50.42 Chronic combined systolic (congestive) and  diastolic (congestive) heart failure Wound Cleansing Wound #1 Right,Medial Foot o May Shower, gently pat wound dry prior to applying new dressing. Secondary Dressing Wound #1 Right,Medial Foot o ABD and Kerlix/Conform Dressing Change Frequency Wound #1 Right,Medial Foot o Change dressing every day. Follow-up Appointments Wound #1 Right,Medial Foot o Return Appointment in 1 week. Edema Control o Elevate legs to the level of the heart and pump ankles as often as possible o Other: - TubiGrip G Radiology o X-ray, foot - Right Foot Services and Therapies o Ankle Brachial Index (ABI) Electronic Signature(s) Signed: 12/27/2019 12:26:36 PM By: Worthy Keeler PA-C Signed: 12/27/2019 12:56:57 PM By: Montey Hora Entered By: Montey Hora on 12/27/2019 10:43:53 Lacasse, Kanika AMarland Kitchen ([id number]) -------------------------------------------------------------------------------- Problem List Details Patient Name: Gloria Both A. Date of Service: 12/27/2019 9:45 AM Medical Record Number: [id number] Patient Account Number: [id number] Date of Birth/Sex: 1955/11/11 (64 y.o. F) Treating RN: Montey Hora Primary Care Provider: [name] Other Clinician: Referring Provider: Carlynn Spry Treating Provider/Extender: Melburn Hake, Natalyn Szymanowski Weeks in Treatment: 0 Active Problems ICD-10 Encounter Code Description Active Date MDM Diagnosis E11.621 Type 2 diabetes mellitus with foot ulcer 12/27/2019 No Yes L97.511 Non-pressure chronic ulcer of other part of right foot limited to 12/27/2019 No Yes breakdown of skin I10 Essential (primary) hypertension 12/27/2019 No Yes I50.42 Chronic combined systolic (congestive) and diastolic (congestive) heart 12/27/2019 No Yes failure Inactive Problems Resolved Problems Electronic Signature(s) Signed: 12/27/2019 10:28:22 AM By: Worthy Keeler PA-C Entered By: Worthy Keeler on 12/27/2019 10:28:22 Vaux, Jennier AMarland Kitchen  ([id number]) -------------------------------------------------------------------------------- Progress Note Details Patient Name: Gloria Both A. Date of Service: 12/27/2019 9:45 AM Medical Record Number: [id number] Patient Account Number: [id number] Date of Birth/Sex: 1955-10-14 (65 y.o. F) Treating RN: Montey Hora Primary Care Provider: [name] Other Clinician: Referring Provider: Carlynn Spry Treating Provider/Extender: Melburn Hake, Shron Ozer Weeks in Treatment: 0 Subjective Chief Complaint Information obtained from Patient Right foot ulcer History of Present Illness (HPI) 12/27/2019 upon evaluation today patient  appears to be doing somewhat poorly upon initial inspection here in the clinic. She has unfortunately been having issues with for the past week a blister over her right heel that started on the plantar aspect and has spread to the medial aspect. She does have a history of diabetes mellitus type 2 she also has hypertension along with congestive heart failure. With that being said she was supposed to be having knee surgery on her right knee but this was postponed by the surgeon and she was referred to [place] due to the blister noted. Fortunately there is no signs of active infection at this time. With that being said I am concerned about the fact that this could develop into infection. Obviously we want to prevent such from happening. She does note that she been placed on a antibiotic for urinary tract infection by her primary care provider she has that to pick up. I asked her to call and let us know what that antibiotic was so that we can put that in the chart. For that reason I am not can give her anything empirically at this point. The patient cannot remember any injury that she had to this region in fact she really does not know how this began at all other than the fact that it "just showed up". Patient History Information obtained from Patient. Allergies Bactrim Family  History Cancer - Father,Mother, Diabetes - Father,Mother,Siblings, Hypertension - Mother,Father, Lung Disease - Father, Thyroid Problems - Siblings, No family history of Heart Disease, Hereditary Spherocytosis, Kidney Disease, Seizures, Stroke, Tuberculosis. Social History Former smoker - 15 years, Marital Status - Single, Alcohol Use - Never, Drug Use - No History, Caffeine Use - Daily. Medical History Eyes Patient has history of Cataracts - removed Denies history of Glaucoma, Optic Neuritis Ear/Nose/Mouth/Throat Denies history of Chronic sinus problems/congestion, Middle ear problems Hematologic/Lymphatic Denies history of Anemia, Hemophilia, Human Immunodeficiency Virus, Lymphedema, Sickle Cell Disease Respiratory Patient has history of Asthma Denies history of Aspiration, Chronic Obstructive Pulmonary Disease (COPD), Pneumothorax, Sleep Apnea, Tuberculosis Cardiovascular Patient has history of Congestive Heart Failure, Hypertension Denies history of Angina, Arrhythmia, Coronary Artery Disease, Deep Vein Thrombosis, Hypotension, Myocardial Infarction, Peripheral Arterial Disease, Peripheral Venous Disease, Phlebitis, Vasculitis Gastrointestinal Denies history of Cirrhosis , Colitis, Crohn s, Hepatitis A, Hepatitis B, Hepatitis C Endocrine Patient has history of Type II Diabetes Denies history of Type I Diabetes Genitourinary Denies history of End Stage Renal Disease Immunological Denies history of Lupus Erythematosus, Raynaud s, Scleroderma Integumentary (Skin) Denies history of History of Burn, History of pressure wounds Musculoskeletal Denies history of Gout, Rheumatoid Arthritis, Osteoarthritis, Osteomyelitis Neurologic Denies history of Dementia, Neuropathy, Quadriplegia, Paraplegia, Seizure Disorder Oncologic Patient has history of Received Chemotherapy, Received Radiation Psychiatric Denies history of Anorexia/bulimia, Confinement Anxiety Kalp, Yenifer A.  ([id number]) Patient is treated with Insulin, Oral Agents. Hospitalization/Surgery History - [facility] weakness. Review of Systems (ROS) Constitutional Symptoms (General Health) Denies complaints or symptoms of Fatigue, Fever, Chills, Marked Weight Change. Eyes Denies complaints or symptoms of Dry Eyes, Vision Changes, Glasses / Contacts. Ear/Nose/Mouth/Throat Denies complaints or symptoms of Difficult clearing ears, Sinusitis. Hematologic/Lymphatic Denies complaints or symptoms of Bleeding / Clotting Disorders, Human Immunodeficiency Virus. Respiratory Denies complaints or symptoms of Chronic or frequent coughs, Shortness of Breath. Cardiovascular Denies complaints or symptoms of Chest pain, LE edema. Gastrointestinal Denies complaints or symptoms of Frequent diarrhea, Nausea, Vomiting. Endocrine Denies complaints or symptoms of Hepatitis, Thyroid disease, Polydypsia (Excessive Thirst). Genitourinary Complains or has symptoms of Kidney failure/ Dialysis. Denies complaints  or symptoms of Incontinence/dribbling. Immunological Denies complaints or symptoms of Hives, Itching. Integumentary (Skin) Denies complaints or symptoms of Wounds, Bleeding or bruising tendency, Breakdown, Swelling. Musculoskeletal Denies complaints or symptoms of Muscle Pain, Muscle Weakness. Neurologic Denies complaints or symptoms of Numbness/parasthesias, Focal/Weakness. Oncologic breast cancer Psychiatric Denies complaints or symptoms of Anxiety, Claustrophobia. Objective Constitutional patient is hypertensive.. pulse regular and within target range for patient.Marland Kitchen respirations regular, non-labored and within target range for patient.Marland Kitchen temperature within target range for patient.. Well-nourished and well-hydrated in no acute distress. Vitals Time Taken: 10:08 AM, Height: 62 in, Source: Stated, Weight: 195 lbs, Source: Stated, BMI: 35.7, Temperature: 97.8 F, Pulse: 76 bpm, Respiratory Rate: 16 breaths/min,  Blood Pressure: 135/76 mmHg. Eyes conjunctiva clear no eyelid edema noted. pupils equal round and reactive to light and accommodation. Ears, Nose, Mouth, and Throat no gross abnormality of ear auricles or external auditory canals. normal hearing noted during conversation. mucus membranes moist. Respiratory normal breathing without difficulty. Cardiovascular 2+ dorsalis pedis/posterior tibialis pulses. 1+ pitting edema of the bilateral lower extremities. Musculoskeletal normal gait and posture. no significant deformity or arthritic changes, no loss or range of motion, no clubbing. Psychiatric this patient is able to make decisions and demonstrates good insight into disease process. Alert and Oriented x 3. pleasant and cooperative. General Notes: Upon inspection patient has a blister that appears to be potentially at least partially blood-filled on the heel towards the medial aspect of her right foot. This is not tender to touch does not appear to be warm to touch there is also no signs of erythema surrounding all of which is good news. With that being said fortunately there is no signs of active infection systemically either which is also good news though this is something we need to keep a close eye on in my opinion. She has good pulses but unfortunately her ABIs were noncompressible she is going Uhls, Zanya A. ([id number]) require in my opinion formal testing for arterial status. Also I am to recommend an x-ray of her heel as she actually did have an MRI but this was of her knee I do not believe they ever x-rayed her foot/heel region. Integumentary (Hair, Skin) Wound #1 status is Open. Original cause of wound was Blister. The wound is located on the Right,Medial Foot. The wound measures 5cm length x 4.5cm width x 0.1cm depth; 17.671cm^2 area and 1.767cm^3 volume. There is no tunneling or undermining noted. There is a medium amount of serosanguineous drainage noted. The wound margin is flat  and intact. There is no granulation within the wound bed. There is a large (67- 100%) amount of necrotic tissue within the wound bed including Adherent Slough. Assessment Active Problems ICD-10 Type 2 diabetes mellitus with foot ulcer Non-pressure chronic ulcer of other part of right foot limited to breakdown of skin Essential (primary) hypertension Chronic combined systolic (congestive) and diastolic (congestive) heart failure Plan Wound Cleansing: Wound #1 Right,Medial Foot: May Shower, gently pat wound dry prior to applying new dressing. Secondary Dressing: Wound #1 Right,Medial Foot: ABD and Kerlix/Conform Dressing Change Frequency: Wound #1 Right,Medial Foot: Change dressing every day. Follow-up Appointments: Wound #1 Right,Medial Foot: Return Appointment in 1 week. Edema Control: Elevate legs to the level of the heart and pump ankles as often as possible Other: - TubiGrip G Services and Therapies ordered were: Ankle Brachial Index (ABI) Radiology ordered were: X-ray, foot - Right Foot 1. My suggestion currently is can be that we go ahead and initiate treatment with a ABD pad  secured with roll gauze and then subsequently Tubigrip applied over this for some pressure to see if we get this to somewhat reabsorb. I feel like this would be preferable to try to open the area up at least at this point. Obviously if it opens on its own or if anything becomes infected or significantly changed I may change my mind in that regard. 2. I am to recommend an x-ray of the right foot in order to evaluate for any signs of osteomyelitis or other abnormalities that may explain what exactly is going on especially in light of the fact that we really do not even know exactly how this began. 3. I am also can a suggest at this point that the patient elevate her legs and try to keep her edema under good control in the interim we will see how she does over the next week. 4. With regard to her arterial  status because her ABIs were noncompressible and get a send her to vascular for formal arterial testing including ABI and TBI with the arterial study. We will see patient back for reevaluation in 1 week here in the clinic. If anything worsens or changes patient will contact our office for additional recommendations. Electronic Signature(s) Signed: 12/27/2019 12:12:48 PM By: Worthy Keeler PA-C Entered By: Worthy Keeler on 12/27/2019 12:12:47 Preisler, [name] ([id number]) -------------------------------------------------------------------------------- ROS/PFSH Details Patient Name: Gloria Both A. Date of Service: 12/27/2019 9:45 AM Medical Record Number: [id number] Patient Account Number: [id number] Date of Birth/Sex: 1955/12/26 (64 y.o. F) Treating RN: Army Melia Primary Care Provider: [name] Other Clinician: Referring Provider: Carlynn Spry Treating Provider/Extender: Melburn Hake, Jacquelyn Antony Weeks in Treatment: 0 Information Obtained From Patient Constitutional Symptoms (General Health) Complaints and Symptoms: Negative for: Fatigue; Fever; Chills; Marked Weight Change Eyes Complaints and Symptoms: Negative for: Dry Eyes; Vision Changes; Glasses / Contacts Medical History: Positive for: Cataracts - removed Negative for: Glaucoma; Optic Neuritis Ear/Nose/Mouth/Throat Complaints and Symptoms: Negative for: Difficult clearing ears; Sinusitis Medical History: Negative for: Chronic sinus problems/congestion; Middle ear problems Hematologic/Lymphatic Complaints and Symptoms: Negative for: Bleeding / Clotting Disorders; Human Immunodeficiency Virus Medical History: Negative for: Anemia; Hemophilia; Human Immunodeficiency Virus; Lymphedema; Sickle Cell Disease Respiratory Complaints and Symptoms: Negative for: Chronic or frequent coughs; Shortness of Breath Medical History: Positive for: Asthma Negative for: Aspiration; Chronic Obstructive Pulmonary Disease (COPD);  Pneumothorax; Sleep Apnea; Tuberculosis Cardiovascular Complaints and Symptoms: Negative for: Chest pain; LE edema Medical History: Positive for: Congestive Heart Failure; Hypertension Negative for: Angina; Arrhythmia; Coronary Artery Disease; Deep Vein Thrombosis; Hypotension; Myocardial Infarction; Peripheral Arterial Disease; Peripheral Venous Disease; Phlebitis; Vasculitis Gastrointestinal Complaints and Symptoms: Negative for: Frequent diarrhea; Nausea; Vomiting Medical History: Negative for: Cirrhosis ; Colitis; Crohnos; Hepatitis A; Hepatitis B; Hepatitis C Endocrine Towle, Shanaye A. ([id number]) Complaints and Symptoms: Negative for: Hepatitis; Thyroid disease; Polydypsia (Excessive Thirst) Medical History: Positive for: Type II Diabetes Negative for: Type I Diabetes Time with diabetes: 4 years Treated with: Insulin, Oral agents Genitourinary Complaints and Symptoms: Positive for: Kidney failure/ Dialysis Negative for: Incontinence/dribbling Medical History: Negative for: End Stage Renal Disease Immunological Complaints and Symptoms: Negative for: Hives; Itching Medical History: Negative for: Lupus Erythematosus; Raynaudos; Scleroderma Integumentary (Skin) Complaints and Symptoms: Negative for: Wounds; Bleeding or bruising tendency; Breakdown; Swelling Medical History: Negative for: History of Burn; History of pressure wounds Musculoskeletal Complaints and Symptoms: Negative for: Muscle Pain; Muscle Weakness Medical History: Negative for: Gout; Rheumatoid Arthritis; Osteoarthritis; Osteomyelitis Neurologic Complaints and Symptoms: Negative for: Numbness/parasthesias; Focal/Weakness Medical History: Negative for:  Dementia; Neuropathy; Quadriplegia; Paraplegia; Seizure Disorder Psychiatric Complaints and Symptoms: Negative for: Anxiety; Claustrophobia Medical History: Negative for: Anorexia/bulimia; Confinement Anxiety Oncologic Complaints and  Symptoms: Review of System Notes: breast cancer Medical History: Positive for: Received Chemotherapy; Received Radiation HBO Extended History Items Eyes: Cataracts Vitiello, Naira A. ([id number]) Immunizations Pneumococcal Vaccine: Received Pneumococcal Vaccination: Yes Implantable Devices None Hospitalization / Surgery History Type of Hospitalization/Surgery [facility] weakness Family and Social History Cancer: Yes - Father,Mother; Diabetes: Yes - Father,Mother,Siblings; Heart Disease: No; Hereditary Spherocytosis: No; Hypertension: Yes - Mother,Father; Kidney Disease: No; Lung Disease: Yes - Father; Seizures: No; Stroke: No; Thyroid Problems: Yes - Siblings; Tuberculosis: No; Former smoker - 29 years; Marital Status - Single; Alcohol Use: Never; Drug Use: No History; Caffeine Use: Daily; Financial Concerns: No; Food, Clothing or Shelter Needs: No; Support System Lacking: No; Transportation Concerns: No Electronic Signature(s) Signed: 12/27/2019 11:41:03 AM By: Army Melia Signed: 12/27/2019 12:26:36 PM By: Worthy Keeler PA-C Entered By: Army Melia on 12/27/2019 10:15:42 Yonker, [name] ([id number]) -------------------------------------------------------------------------------- SuperBill Details Patient Name: Gloria Both A. Date of Service: 12/27/2019 Medical Record Number: [id number] Patient Account Number: [id number] Date of Birth/Sex: 04/27/1956 (64 y.o. F) Treating RN: Montey Hora Primary Care Provider: [name] Other Clinician: Referring Provider: Carlynn Spry Treating Provider/Extender: Melburn Hake, Pete Merten Weeks in Treatment: 0 Diagnosis Coding ICD-10 Codes Code Description E11.621 Type 2 diabetes mellitus with foot ulcer L97.511 Non-pressure chronic ulcer of other part of right foot limited to breakdown of skin I10 Essential (primary) hypertension I50.42 Chronic combined systolic (congestive) and diastolic (congestive) heart failure Facility Procedures CPT4  Code: [id number] Description: 99214 - WOUND CARE VISIT-LEV 4 EST PT Modifier: Quantity: 1 Physician Procedures CPT4 Code: [id number] Description: 63817 - WC PHYS LEVEL 4 - NEW PT Modifier: Quantity: 1 CPT4 Code: Description: ICD-10 Diagnosis Description E11.621 Type 2 diabetes mellitus with foot ulcer L97.511 Non-pressure chronic ulcer of other part of right foot limited to breakd I10 Essential (primary) hypertension I50.42 Chronic combined systolic (congestive)  and diastolic (congestive) heart Modifier: own of skin failure Quantity: Electronic Signature(s) Signed: 12/27/2019 12:13:11 PM By: Worthy Keeler PA-C Entered By: Worthy Keeler on 12/27/2019 12:13:10

## 2019-12-28 ENCOUNTER — Other Ambulatory Visit: Payer: Self-pay

## 2019-12-28 ENCOUNTER — Encounter: Payer: Self-pay | Admitting: Internal Medicine

## 2019-12-28 NOTE — Progress Notes (Signed)
Patient prescreened for appointment. Pt reports that she didn't have her knee surgery because of a wound on her right foot, was prescribed antibiotics on Monday, however couldn't get them until today because pharmacy had to order them. Also saw nephrology yesterday due to burning with urination, was told "I have a staph infection"

## 2019-12-29 ENCOUNTER — Inpatient Hospital Stay: Payer: Medicare HMO

## 2019-12-29 ENCOUNTER — Inpatient Hospital Stay (HOSPITAL_BASED_OUTPATIENT_CLINIC_OR_DEPARTMENT_OTHER): Payer: Medicare HMO | Admitting: Internal Medicine

## 2019-12-29 DIAGNOSIS — C50212 Malignant neoplasm of upper-inner quadrant of left female breast: Secondary | ICD-10-CM | POA: Diagnosis not present

## 2019-12-29 DIAGNOSIS — R918 Other nonspecific abnormal finding of lung field: Secondary | ICD-10-CM | POA: Diagnosis not present

## 2019-12-29 DIAGNOSIS — Z7189 Other specified counseling: Secondary | ICD-10-CM

## 2019-12-29 DIAGNOSIS — Z5111 Encounter for antineoplastic chemotherapy: Secondary | ICD-10-CM | POA: Diagnosis not present

## 2019-12-29 DIAGNOSIS — M25569 Pain in unspecified knee: Secondary | ICD-10-CM | POA: Diagnosis not present

## 2019-12-29 DIAGNOSIS — R2689 Other abnormalities of gait and mobility: Secondary | ICD-10-CM | POA: Diagnosis not present

## 2019-12-29 DIAGNOSIS — C7951 Secondary malignant neoplasm of bone: Secondary | ICD-10-CM | POA: Diagnosis not present

## 2019-12-29 DIAGNOSIS — Z87891 Personal history of nicotine dependence: Secondary | ICD-10-CM | POA: Diagnosis not present

## 2019-12-29 DIAGNOSIS — Z170 Estrogen receptor positive status [ER+]: Secondary | ICD-10-CM | POA: Diagnosis not present

## 2019-12-29 DIAGNOSIS — Z95828 Presence of other vascular implants and grafts: Secondary | ICD-10-CM

## 2019-12-29 DIAGNOSIS — N184 Chronic kidney disease, stage 4 (severe): Secondary | ICD-10-CM | POA: Diagnosis not present

## 2019-12-29 LAB — COMPREHENSIVE METABOLIC PANEL
ALT: 13 U/L (ref 0–44)
AST: 15 U/L (ref 15–41)
Albumin: 3.5 g/dL (ref 3.5–5.0)
Alkaline Phosphatase: 56 U/L (ref 38–126)
Anion gap: 10 (ref 5–15)
BUN: 53 mg/dL — ABNORMAL HIGH (ref 8–23)
CO2: 23 mmol/L (ref 22–32)
Calcium: 8.6 mg/dL — ABNORMAL LOW (ref 8.9–10.3)
Chloride: 101 mmol/L (ref 98–111)
Creatinine, Ser: 3.57 mg/dL — ABNORMAL HIGH (ref 0.44–1.00)
GFR calc Af Amer: 15 mL/min — ABNORMAL LOW (ref >60)
GFR calc non Af Amer: 13 mL/min — ABNORMAL LOW (ref >60)
Glucose, Bld: 184 mg/dL — ABNORMAL HIGH (ref 70–99)
Potassium: 4.4 mmol/L (ref 3.5–5.1)
Sodium: 134 mmol/L — ABNORMAL LOW (ref 135–145)
Total Bilirubin: 0.5 mg/dL (ref 0.3–1.2)
Total Protein: 7.1 g/dL (ref 6.5–8.1)

## 2019-12-29 LAB — CBC WITH DIFFERENTIAL/PLATELET
Abs Immature Granulocytes: 0.13 10*3/uL — ABNORMAL HIGH (ref 0.00–0.07)
Basophils Absolute: 0.1 10*3/uL (ref 0.0–0.1)
Basophils Relative: 0 %
Eosinophils Absolute: 0.1 10*3/uL (ref 0.0–0.5)
Eosinophils Relative: 1 %
HCT: 27 % — ABNORMAL LOW (ref 36.0–46.0)
Hemoglobin: 9.2 g/dL — ABNORMAL LOW (ref 12.0–15.0)
Immature Granulocytes: 1 %
Lymphocytes Relative: 9 %
Lymphs Abs: 1.1 10*3/uL (ref 0.7–4.0)
MCH: 31.7 pg (ref 26.0–34.0)
MCHC: 34.1 g/dL (ref 30.0–36.0)
MCV: 93.1 fL (ref 80.0–100.0)
Monocytes Absolute: 1 10*3/uL (ref 0.1–1.0)
Monocytes Relative: 8 %
Neutro Abs: 9.8 10*3/uL — ABNORMAL HIGH (ref 1.7–7.7)
Neutrophils Relative %: 81 %
Platelets: 331 10*3/uL (ref 150–400)
RBC: 2.9 MIL/uL — ABNORMAL LOW (ref 3.87–5.11)
RDW: 13 % (ref 11.5–15.5)
WBC: 12.1 10*3/uL — ABNORMAL HIGH (ref 4.0–10.5)
nRBC: 0 % (ref 0.0–0.2)

## 2019-12-29 MED ORDER — HEPARIN SOD (PORK) LOCK FLUSH 100 UNIT/ML IV SOLN
500.0000 [IU] | Freq: Once | INTRAVENOUS | Status: AC
  Administered 2019-12-29: 500 [IU] via INTRAVENOUS
  Filled 2019-12-29: qty 5

## 2019-12-29 MED ORDER — SODIUM CHLORIDE 0.9% FLUSH
10.0000 mL | INTRAVENOUS | Status: DC | PRN
  Administered 2019-12-29: 10 mL via INTRAVENOUS
  Filled 2019-12-29: qty 10

## 2019-12-29 NOTE — Assessment & Plan Note (Addendum)
Left breast cancer- stage IV- ER/PR +, HER-2/neu. Currently on eribulin [renally dosed [start at 1 mg/m] started on-04/29/2019.   PET scan MAY 10th, 2021-STABLE; Partial response/ uptake pleural-based lesion/lung lesions/left breast mass.   # HOLD Erubulin; given the wound infection/see below  # Right foot infection/diabetic- [s/p wound clinic evaluation]; on amoxicillin [started 5/26 x 5 days]; defer management to wound clinic.  # Bone mets-sclerotic; Right acetabular uptake-s/p radiation. Hypocalcemia-ca-8.3/low vitD- on vit D daily.  Stable.  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  #  Chronic kidney disease - stage IV-stable [[name]  # Anemia- hemoglobin today-9/CKD-stable. On PO iron.  # DISPOSITION:  #  HOLD chemo today # Follow up in 2 weeks; /MD-labs- cbc/cmp-ca-27-29; Eribulin- Dr.B

## 2019-12-29 NOTE — Progress Notes (Signed)
Pt called for pre assessment by RN yesterday pt denies any changes.  Pt has large abcess on right bottom of foot and ankle.  Reports visits wound clinic.

## 2019-12-29 NOTE — Progress Notes (Signed)
[place] OFFICE PROGRESS NOTE  Patient Care Team: [name], [name], MD as Medical Oncologist (Medical Oncology) Corey Skains, MD as Consulting Physician (Cardiology)  Cancer Staging No matching staging information was found for the patient.   Oncology History Overview Note  # OCT 2015-STAGE IV LEFT BREAST T2N1 [T=4cm; [name]; [name]; [name]; EBUS- Positive Paratrac/subcarinal LN s/p ? Taxotere [in [place]; [name]; [name];[name]; [name]; suv 3]; [name]; [name]; [name];  [name]; Ant R iliac bone; [name]; [name]; START-Taxol weekly. March 2020- Taxol every 2 weeks [PN]; SEP 2020- PET progression  # SEP 04/29/2019- ERIBULIN s/p RT - Right hip- [s/p RT- NOV 2020]  # Poorly controlled Blood sugars- improved.   # Pancreatitis Hx/PEI- on creon in past / CKD IV [creat ~ 3-4; [name]; [name]; mild left sided weakness]  # Jan 2020-  Lobular lesion on tongue- s/p excision pyogenic granuloma [Dr.McQueen]   # GENETIC TESTING/COUNSELLING: HETEROZYGOUS Cystic Fibrosis Gene [explains hx of recurrent pancreatitis]  # MOLECULAR TESTING: NA   # PALLIATIVE CARE: 1/22-Discussed/Declined ------------------------------------------------   DIAGNOSIS: [ 2015] BREAST CA; [name]; [name] ;GOALS: Palliative  CURRENT/MOST RECENT THERAPY: ERIBULIN [C].     Carcinoma of upper-inner quadrant of left  breast in female, estrogen receptor positive ([place])  04/29/2019 -  Chemotherapy   The patient had eriBULin mesylate ([place]) 2 mg in sodium chloride 0.9 % 100 mL chemo infusion, 2 mg, Intravenous,  Once, 9 of 10 cycles Dose modification: 1 mg/m2 (original dose 1 mg/m2, Cycle 1, Reason: Provider Judgment) Administration: 2 mg (06/03/2019), 2 mg (04/29/2019), 2 mg (06/10/2019), 2 mg (07/04/2019), 2 mg (07/11/2019), 2 mg (07/25/2019), 2 mg (08/03/2019), 2 mg (08/19/2019), 2 mg (08/26/2019), 2 mg (09/09/2019), 2 mg (09/16/2019), 2 mg (10/07/2019), 2 mg (10/14/2019), 2 mg (10/28/2019), 2 mg (11/04/2019), 2 mg (11/28/2019), 2 mg (12/09/2019)  for chemotherapy treatment.     INTERVAL HISTORY:  [name] 64 y.o.  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  In the interim patient was evaluated by nephrology/wound care for right foot infection; she is currently on antibiotics.  Patient is arthroscopic surgery on the knees was post-poned because foot infection.  Otherwise denies any nausea vomiting any fevers or chills.  Chronic swelling in legs.  Review of Systems  Constitutional: Positive for malaise/fatigue. Negative for chills, diaphoresis, [name], hemoptysis, sputum production, [name], palpitations and orthopnea.  Gastrointestinal: Negative for abdominal pain, blood in stool, constipation, diarrhea, heartburn, melena, nausea and vomiting.  Genitourinary: Negative for dysuria, frequency and urgency.  Musculoskeletal: Positive for back pain and joint pain.  Skin: Negative.  Negative for itching and rash.  Neurological: Positive for tingling. Negative  for dizziness, focal weakness, weakness and  headaches.  Endo/Heme/Allergies: Does not bruise/bleed easily.  Psychiatric/Behavioral: Negative for depression. The patient is not nervous/anxious and does not have insomnia.     PAST MEDICAL HISTORY :  Past Medical History:  Diagnosis Date  . Anemia   . Anxiety   . Asthma   . Cancer ([place]) 03/10/2018   Per [facility] PET order. Carcinoma of upper-inner quadrant of left breast in female, estrogen receptor positive .  Marland Kitchen Cancer (HCC)    LUNG  . CHF (congestive heart failure) ([place]) 1997  . CKD (chronic kidney disease)   . Depression   . Diabetes mellitus, type 2 ([place])   . Family history of breast cancer   . Family history of colon cancer   . Family history of ovarian cancer   . Family history of pancreatic cancer   . Family history of prostate cancer   . Family history of stomach cancer   . GERD (gastroesophageal reflux disease)    history of an ulcer  . Hair loss   . History of left breast cancer 05/29/14  . History of partial hysterectomy 12/31/2016   Per patient.  Has not had a period in years.  Had a partial hysterectomy years ago.  Marland Kitchen Hypertension   . Mitral valve regurgitation   . Neuromuscular disorder (HCC)    neuropathies in hand  . Obesity   . Pancreatitis 1997  . Stroke [facility]) 2010   with mild left arm weakness    PAST SURGICAL HISTORY :   Past Surgical History:  Procedure Laterality Date  . CATARACT EXTRACTION W/PHACO Right 02/24/2019   Procedure: CATARACT EXTRACTION PHACO AND INTRAOCULAR LENS PLACEMENT ([place]) RIGHT DIABETES;  Surgeon: Marchia Meiers, MD;  Location: [facility] ORS;  Service: Ophthalmology;  Laterality: Right;  [place] 01:13.0 CDE 7.96 Fluid Pack Lot # [id number]  . CATARACT EXTRACTION W/PHACO Left 03/24/2019   Procedure: CATARACT EXTRACTION PHACO AND INTRAOCULAR LENS PLACEMENT ([facility]) - left diabetic;  Surgeon: Marchia Meiers, MD;  Location: [facility] ORS;  Service: Ophthalmology;  Laterality: Left;  [place]  01:36 CDE 13.93 Fluid pack lot # [id number]  . CESAREAN SECTION    .  CHOLECYSTECTOMY    . EXCISION OF TONGUE LESION N/A 08/17/2018   Procedure: EXCISION OF TONGUE LESION WITH FROZEN SECTIONS;  Surgeon: Beverly Gust, MD;  Location: [facility] ORS;  Service: ENT;  Laterality: N/A;  . EYE SURGERY Right    cataract extraction  . PARTIAL HYSTERECTOMY  12/31/2016   Per patient, she has not had a period in years since she had a partial hysterectomy.  Marland Kitchen PORTA CATH INSERTION    . TUBAL LIGATION      FAMILY HISTORY :   Family History  Problem Relation Age of Onset  . Ovarian cancer Mother 47  . Diabetes Mother   . Hypertension Mother   . COPD Father   . Hypertension Father   . Colon cancer Father 56  . Diabetes Sister   . Breast cancer Sister 69       bilateral  . Diabetes Brother   . Leukemia Maternal Aunt   . Pancreatic cancer Paternal Aunt 25  . Pancreatic cancer Paternal Uncle   . Colon cancer Paternal Uncle   . Stomach cancer Maternal Grandfather 25  . Throat cancer Paternal Grandmother   . Breast cancer Maternal Aunt 80  . Colon cancer Maternal Aunt   . Bone cancer Maternal Aunt   . Breast cancer Paternal Aunt  dx >50  . Prostate cancer Paternal Uncle   . Pancreatic cancer Paternal Uncle   . Throat cancer Paternal Uncle   . Lung cancer Paternal Uncle   . Stomach cancer Paternal Uncle   . Brain cancer Paternal Aunt   . Cancer Cousin        liver, kidney  . Prostate cancer Cousin        meastatic  . Lung cancer Other     SOCIAL HISTORY:   Social History   Tobacco Use  . Smoking status: Former Smoker    Packs/day: 0.50    Years: 1.00    Pack years: 0.50    Types: Cigarettes  . Smokeless tobacco: Never Used  Substance Use Topics  . Alcohol use: No    Alcohol/week: 0.0 standard drinks  . Drug use: No    ALLERGIES:  is allergic to fish-derived products and sulfamethoxazole-trimethoprim.  MEDICATIONS:  Current Outpatient Medications  Medication Sig Dispense Refill  . acetaminophen-codeine (TYLENOL #4) 300-60 MG tablet Take  1 tablet by mouth 2 (two) times daily as needed for moderate pain.     Marland Kitchen albuterol (PROAIR HFA) 108 (90 BASE) MCG/ACT inhaler Inhale 2 puffs into the lungs every 6 (six) hours as needed for wheezing or shortness of breath.     Marland Kitchen albuterol (PROVENTIL) (2.5 MG/3ML) 0.083% nebulizer solution Inhale 2.5 mg into the lungs every 6 (six) hours as needed for shortness of breath.     . ALPRAZolam (XANAX) 0.5 MG tablet Take 0.5 mg by mouth 2 (two) times daily as needed for anxiety or sleep.     Marland Kitchen amLODipine (NORVASC) 10 MG tablet Take 10 mg by mouth daily.     Marland Kitchen aspirin EC 81 MG tablet Take 81 mg by mouth daily.     Marland Kitchen atenolol (TENORMIN) 100 MG tablet Take 100 mg by mouth 2 (two) times daily.     . bumetanide (BUMEX) 0.5 MG tablet Take 0.5 mg by mouth 2 (two) times daily.     . calcitRIOL (ROCALTROL) 0.25 MCG capsule Take 0.25 mcg by mouth daily.     . Cinnamon 500 MG capsule Take 500 mg by mouth 2 (two) times daily.     . cloNIDine (CATAPRES) 0.2 MG tablet Take 0.2 mg by mouth 2 (two) times daily.     . diflorasone (PSORCON) 0.05 % ointment Apply 1 application topically as directed.     Marland Kitchen ELDERBERRY PO Take 1 tablet by mouth daily.    . enalapril (VASOTEC) 10 MG tablet Take 20 mg by mouth 2 (two) times a day.     . famotidine (PEPCID) 20 MG tablet Take 20 mg by mouth 2 (two) times daily.     . ferrous sulfate 325 (65 FE) MG tablet Take 325 mg by mouth 2 (two) times daily with a meal.    . FLUoxetine (PROZAC) 20 MG capsule Take 20 mg by mouth 2 (two) times daily.     . Fluticasone Propionate, Inhal, (FLOVENT DISKUS) 100 MCG/BLIST AEPB Inhale 2 puffs into the lungs 2 (two) times daily as needed (respiratory problems).     . gabapentin (NEURONTIN) 100 MG capsule TAKE 1 CAPSULE(100 MG) BY MOUTH AT BEDTIME 30 capsule 6  . glyBURIDE (DIABETA) 5 MG tablet Take 10 mg by mouth 2 (two) times daily with a meal.     . LEVEMIR FLEXTOUCH 100 UNIT/ML Pen Inject 55 Units into the skin daily.     Marland Kitchen loperamide (IMODIUM  A-D) 2 MG  capsule Take 2-4 mg by mouth 4 (four) times daily as needed for diarrhea or loose stools.     Marland Kitchen NOVOLOG FLEXPEN 100 UNIT/ML FlexPen Inject 7 Units into the skin 2 (two) times daily.     Marland Kitchen oxyCODONE-acetaminophen (PERCOCET/ROXICET) 5-325 MG tablet Take 1 tablet by mouth daily as needed for severe pain.    . simvastatin (ZOCOR) 20 MG tablet Take 20 mg by mouth every evening.     . vitamin B-12 (CYANOCOBALAMIN) 1000 MCG tablet Take 1,000 mcg by mouth daily.     No current facility-administered medications for this visit.   Facility-Administered Medications Ordered in Other Visits  Medication Dose Route Frequency Provider Last Rate Last Admin  . sodium chloride flush (NS) 0.9 % injection 10 mL  10 mL Intravenous PRN Cammie Sickle, MD   10 mL at 01/30/16 1054    PHYSICAL EXAMINATION: ECOG PERFORMANCE STATUS: 1 - Symptomatic but completely ambulatory  BP 105/70 (BP Location: Left Arm, Patient Position: Sitting)   Pulse 69   Temp 98 F (36.7 C) (Tympanic)   Resp 18   Wt 199 lb (90.3 kg)   SpO2 100%   BMI 35.25 kg/m   Filed Weights   12/29/19 0845  Weight: 199 lb (90.3 kg)    Physical Exam  Constitutional: She is oriented to person, place, and time and well-developed, well-nourished, and in no distress.  She is alone.  HENT:  Head: Normocephalic and atraumatic.  Mouth/Throat: Oropharynx is clear and moist. No oropharyngeal exudate.  Eyes: Pupils are equal, round, and reactive to light.  Cardiovascular: Normal rate and regular rhythm.  Pulmonary/Chest: Breath sounds normal. No respiratory distress. She has no wheezes.  Abdominal: Soft. Bowel sounds are normal. She exhibits no distension and no mass. There is no abdominal tenderness. There is no rebound and no guarding.  Musculoskeletal:        General: Edema present. No tenderness. Normal range of motion.     Cervical back: Normal range of motion and neck supple.  Neurological: She is alert and oriented to person,  place, and time.  Skin: Skin is warm.  Right thumb bandaged-wound healing.  No signs of infection or pus.  Psychiatric: Affect normal.    LABORATORY DATA:  I have reviewed the data as listed    Component Value Date/Time   NA 134 (L) 12/29/2019 0800   NA 130 (L) 06/06/2014 1102   K 4.4 12/29/2019 0800   K 3.9 06/06/2014 1102   CL 101 12/29/2019 0800   CL 95 (L) 06/06/2014 1102   CO2 23 12/29/2019 0800   CO2 28 06/06/2014 1102   GLUCOSE 184 (H) 12/29/2019 0800   GLUCOSE 349 (H) 06/06/2014 1102   BUN 53 (H) 12/29/2019 0800   BUN 17 06/06/2014 1102   CREATININE 3.57 (H) 12/29/2019 0800   CREATININE 1.63 (H) 06/06/2014 1102   CALCIUM 8.6 (L) 12/29/2019 0800   CALCIUM 9.2 06/06/2014 1102   PROT 7.1 12/29/2019 0800   PROT 8.2 06/06/2014 1102   ALBUMIN 3.5 12/29/2019 0800   ALBUMIN 3.3 (L) 06/06/2014 1102   AST 15 12/29/2019 0800   AST 7 (L) 06/06/2014 1102   ALT 13 12/29/2019 0800   ALT 12 (L) 06/06/2014 1102   ALKPHOS 56 12/29/2019 0800   ALKPHOS 74 06/06/2014 1102   BILITOT 0.5 12/29/2019 0800   BILITOT 0.4 06/06/2014 1102   GFRNONAA 13 (L) 12/29/2019 0800   GFRNONAA 35 (L) 06/06/2014 1102   GFRAA 15 (L) 12/29/2019 0800  GFRAA 42 (L) 06/06/2014 1102    No results found for: SPEP, UPEP  Lab Results  Component Value Date   WBC 12.1 (H) 12/29/2019   NEUTROABS 9.8 (H) 12/29/2019   HGB 9.2 (L) 12/29/2019   HCT 27.0 (L) 12/29/2019   MCV 93.1 12/29/2019   PLT 331 12/29/2019      Chemistry      Component Value Date/Time   NA 134 (L) 12/29/2019 0800   NA 130 (L) 06/06/2014 1102   K 4.4 12/29/2019 0800   K 3.9 06/06/2014 1102   CL 101 12/29/2019 0800   CL 95 (L) 06/06/2014 1102   CO2 23 12/29/2019 0800   CO2 28 06/06/2014 1102   BUN 53 (H) 12/29/2019 0800   BUN 17 06/06/2014 1102   CREATININE 3.57 (H) 12/29/2019 0800   CREATININE 1.63 (H) 06/06/2014 1102      Component Value Date/Time   CALCIUM 8.6 (L) 12/29/2019 0800   CALCIUM 9.2 06/06/2014 1102    ALKPHOS 56 12/29/2019 0800   ALKPHOS 74 06/06/2014 1102   AST 15 12/29/2019 0800   AST 7 (L) 06/06/2014 1102   ALT 13 12/29/2019 0800   ALT 12 (L) 06/06/2014 1102   BILITOT 0.5 12/29/2019 0800   BILITOT 0.4 06/06/2014 1102       RADIOGRAPHIC STUDIES: I have personally reviewed the radiological images as listed and agreed with the findings in the report. No results found.   ASSESSMENT & PLAN:  Carcinoma of upper-inner quadrant of left breast in female, estrogen receptor positive ([place]) Left breast cancer- stage IV- ER/PR +, HER-2/neu. Currently on eribulin [renally dosed [start at 1 mg/m] started on-04/29/2019.   PET scan MAY 10th, 2021-STABLE; Partial response/ uptake pleural-based lesion/lung lesions/left breast mass.   # HOLD Erubulin; given the wound infection/see below  # Right foot infection/diabetic- [s/p wound clinic evaluation]; on amoxicillin [started 5/26 x 5 days]; defer management to wound clinic.  # Bone mets-sclerotic; Right acetabular uptake-s/p radiation. Hypocalcemia-ca-8.3/low vitD- on vit D daily.  Stable.  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  #  Chronic kidney disease - stage IV-stable [[name]  # Anemia- hemoglobin today-9/CKD-stable. On PO iron.  # DISPOSITION:  #  HOLD chemo today # Follow up in 2 weeks; /MD-labs- cbc/cmp-ca-27-29; Eribulin- Dr.B   Orders Placed This Encounter  Procedures  . Cancer antigen 27.29    Standing Status:   Future    Standing Expiration Date:   12/28/2020   All questions were answered. The patient knows to call the clinic with any problems, questions or concerns.      Cammie Sickle, MD 01/02/2020 6:45 PM

## 2020-01-05 ENCOUNTER — Other Ambulatory Visit: Payer: Self-pay

## 2020-01-05 ENCOUNTER — Encounter: Payer: Medicare HMO | Attending: Physician Assistant | Admitting: Physician Assistant

## 2020-01-05 DIAGNOSIS — I5040 Unspecified combined systolic (congestive) and diastolic (congestive) heart failure: Secondary | ICD-10-CM | POA: Diagnosis not present

## 2020-01-05 DIAGNOSIS — I110 Hypertensive heart disease with heart failure: Secondary | ICD-10-CM | POA: Insufficient documentation

## 2020-01-05 DIAGNOSIS — E11621 Type 2 diabetes mellitus with foot ulcer: Secondary | ICD-10-CM | POA: Diagnosis not present

## 2020-01-05 DIAGNOSIS — Z923 Personal history of irradiation: Secondary | ICD-10-CM | POA: Diagnosis not present

## 2020-01-05 DIAGNOSIS — Z9221 Personal history of antineoplastic chemotherapy: Secondary | ICD-10-CM | POA: Insufficient documentation

## 2020-01-05 DIAGNOSIS — I5042 Chronic combined systolic (congestive) and diastolic (congestive) heart failure: Secondary | ICD-10-CM | POA: Insufficient documentation

## 2020-01-05 DIAGNOSIS — L97511 Non-pressure chronic ulcer of other part of right foot limited to breakdown of skin: Secondary | ICD-10-CM | POA: Diagnosis not present

## 2020-01-05 DIAGNOSIS — L97512 Non-pressure chronic ulcer of other part of right foot with fat layer exposed: Secondary | ICD-10-CM | POA: Diagnosis not present

## 2020-01-05 NOTE — Progress Notes (Addendum)
MINSA, WEDDINGTON ([id number]) Visit Report for 01/05/2020 Chief Complaint Document Details Patient Name: [name], [name] A. Date of Service: 01/05/2020 10:15 AM Medical Record Number: [id number] Patient Account Number: [id number] Date of Birth/Sex: [birth year] (64 y.o. Female) Treating RN: Army Melia Primary Care Provider: [name] Other Clinician: Referring Provider: [name] Treating Provider/Extender: Melburn Hake, Yosgar Demirjian Weeks in Treatment: 1 Information Obtained from: Patient Chief Complaint Right foot ulcer Electronic Signature(s) Signed: 01/05/2020 10:27:08 AM By: Worthy Keeler PA-C Entered By: Worthy Keeler on 01/05/2020 10:27:07 Shaikh, [name] ([id number]) -------------------------------------------------------------------------------- Debridement Details Patient Name: Gloria Both A. Date of Service: 01/05/2020 10:15 AM Medical Record Number: [id number] Patient Account Number: [id number] Date of Birth/Sex: August 28, 1955 (64 y.o. Female) Treating RN: Army Melia Primary Care Provider: [name] Other Clinician: Referring Provider: [name] Treating Provider/Extender: Melburn Hake, Aki Abalos Weeks in Treatment: 1 Debridement Performed for Wound #1 Right,Medial Foot Assessment: Performed By: Physician STONE III, Chanie Soucek E., PA-C Debridement Type: Debridement Severity of Tissue Pre Debridement: Fat layer exposed Level of Consciousness (Pre- Awake and Alert procedure): Pre-procedure Verification/Time Out Yes - 10:33 Taken: Start Time: 10:33 Pain Control: Lidocaine Total Area Debrided (L x W): 4 (cm) x 8 (cm) = 32 (cm) Tissue and other material Viable, Non-Viable, Callus, Slough, Subcutaneous, Skin: Dermis , Slough debrided: Level: Skin/Subcutaneous Tissue Debridement Description: Excisional Instrument: Curette, Forceps, Scissors Bleeding: Minimum Hemostasis Achieved: Pressure End Time: 10:37 Procedural Pain: 0 Post Procedural Pain: 0 Response to  Treatment: Procedure was tolerated well Level of Consciousness (Post- Awake and Alert procedure): Post Debridement Measurements of Total Wound Length: (cm) 2.5 Width: (cm) 3 Depth: (cm) 0.1 Volume: (cm) 0.589 Character of Wound/Ulcer Post Debridement: Improved Severity of Tissue Post Debridement: Fat layer exposed Post Procedure Diagnosis Same as Pre-procedure Electronic Signature(s) Signed: 01/05/2020 10:52:20 AM By: Worthy Keeler PA-C Signed: 01/05/2020 11:31:22 AM By: Army Melia Entered By: Worthy Keeler on 01/05/2020 10:52:20 [name], [name] ([id number]) -------------------------------------------------------------------------------- HPI Details Patient Name: Gloria Both A. Date of Service: 01/05/2020 10:15 AM Medical Record Number: [id number] Patient Account Number: [id number] Date of Birth/Sex: 19-Jun-1956 (64 y.o. Female) Treating RN: Army Melia Primary Care Provider: [name] Other Clinician: Referring Provider: [name] Treating Provider/Extender: Melburn Hake, Johnanna Bakke Weeks in Treatment: 1 History of Present Illness HPI Description: 12/27/2019 upon evaluation today patient appears to be doing somewhat poorly upon initial inspection here in the clinic. She has unfortunately been having issues with for the past week a blister over her right heel that started on the plantar aspect and has spread to the medial aspect. She does have a history of diabetes mellitus type 2 she also has hypertension along with congestive heart failure. With that being said she was supposed to be having knee surgery on her right knee but this was postponed by the surgeon and she was referred to [place] due to the blister noted. Fortunately there is no signs of active infection at this time. With that being said I am concerned about the fact that this could develop into infection. Obviously we want to prevent such from happening. She does note that she been placed on a antibiotic for urinary  tract infection by her primary care provider she has that to pick up. I asked her to call and let us know what that antibiotic was so that we can put that in the chart. For that reason I am not can give her anything empirically at this point. The patient cannot remember any injury that she had to this  region in fact she really does not know how this began at all other than the fact that it "just showed up". 01/05/2020 upon evaluation today patient appears to be doing about the same in regard to her heel ulcer. She has been tolerating the dressing changes without complication. Fortunately there is no signs of active infection at this time. No fevers, chills, nausea, vomiting, or diarrhea. With that being said I do believe the skin is getting need to be removed from the heel where this is somewhat deflated there is still a lot of blood collecting underneath and I think this is not can I do her any good to be perfectly honest. Plus we do not really know exactly what everything looks like underneath as far as the actual wound is concerned. Obviously we need to figure that out. Electronic Signature(s) Signed: 01/05/2020 5:14:36 PM By: Worthy Keeler PA-C Entered By: Worthy Keeler on 01/05/2020 17:14:36 Zheng, [name] ([id number]) -------------------------------------------------------------------------------- Physical Exam Details Patient Name: Gloria Both A. Date of Service: 01/05/2020 10:15 AM Medical Record Number: [id number] Patient Account Number: [id number] Date of Birth/Sex: 15-Mar-1956 (64 y.o. Female) Treating RN: Army Melia Primary Care Provider: [name] Other Clinician: Referring Provider: [name] Treating Provider/Extender: STONE III, Yulanda Diggs Weeks in Treatment: 1 Constitutional Well-nourished and well-hydrated in no acute distress. Respiratory normal breathing without difficulty. Psychiatric this patient is able to make decisions and demonstrates good insight into  disease process. Alert and Oriented x 3. pleasant and cooperative. Notes Upon inspection patient's wound actually did require significant debridement today in order to clear away some of the necrotic debris. Fortunately there did not appear to be any evidence of active infection at this time which is great news. I am very pleased in that regard. She did have a wound on the plantar aspect of her heel. That in my opinion is where all of this is emanating from. That area was much smaller than the entire region as a whole. This is good news as most of the area where the blood blister was under was actually new skin. This was however fairly extensive debridement. Electronic Signature(s) Signed: 01/05/2020 5:27:32 PM By: Worthy Keeler PA-C Entered By: Worthy Keeler on 01/05/2020 17:27:32 [name], [name] ([id number]) -------------------------------------------------------------------------------- Physician Orders Details Patient Name: Gloria Both A. Date of Service: 01/05/2020 10:15 AM Medical Record Number: [id number] Patient Account Number: [id number] Date of Birth/Sex: 07-24-1956 (64 y.o. Female) Treating RN: Army Melia Primary Care Provider: [name] Other Clinician: Referring Provider: [name] Treating Provider/Extender: Melburn Hake, Lisa Milian Weeks in Treatment: 1 Verbal / Phone Orders: No Diagnosis Coding ICD-10 Coding Code Description E11.621 Type 2 diabetes mellitus with foot ulcer L97.511 Non-pressure chronic ulcer of other part of right foot limited to breakdown of skin I10 Essential (primary) hypertension I50.42 Chronic combined systolic (congestive) and diastolic (congestive) heart failure Wound Cleansing Wound #1 Right,Medial Foot o May Shower, gently pat wound dry prior to applying new dressing. Primary Wound Dressing Wound #1 Right,Medial Foot o Silver Alginate Secondary Dressing Wound #1 Right,Medial Foot o ABD and Kerlix/Conform Dressing Change  Frequency Wound #1 Right,Medial Foot o Change dressing every other day. Follow-up Appointments Wound #1 Right,Medial Foot o Return Appointment in 1 week. Edema Control o Elevate legs to the level of the heart and pump ankles as often as possible o Other: - TubiGrip G Off-Loading Wound #1 Right,Medial Foot o Open toe surgical shoe with peg assist. Laboratory o Bacteria identified in Wound by Culture (MICRO) - right  foot oooo LOINC Code: 3557-3 [facility] Convenience Name: Wound culture routine Patient Medications Allergies: Bactrim Notifications Medication Indication Start End Augmentin 01/05/2020 DOSE 1 - oral 875 mg-125 mg tablet - 1 tablet oral taken 2 times per day for 10 days [name], [name] ([id number]) Electronic Signature(s) Signed: 01/06/2020 6:04:00 PM By: Worthy Keeler PA-C Previous Signature: 01/05/2020 11:31:22 AM Version By: Army Melia Entered By: Worthy Keeler on 01/05/2020 17:59:57 [name], [name] Kitchen ([id number]) -------------------------------------------------------------------------------- Prescription 01/05/2020 Patient Name: Gloria Both A. Provider: Worthy Keeler PA-C Date of Birth: 30-Dec-1955 NPI#: [id number] Sex: Female DEA#: [id number] Phone #: [phone] License #: Patient Address: [facility] [place], [place] [address] [address] [place], [place] [address] [phone] Allergies Name Reaction Severity Bactrim Medication Medication: Route: Strength: Form: Augmentin oral 875 mg-125 mg tablet Class: PENICILLIN ANTIBIOTICS Dose: Frequency / Time: Indication: 1 1 tablet oral taken 2 times per day for 10 days Number of Refills: Number of Units: 0 Twenty (20) Tablet(s) Generic Substitution: Start Date: End Date: Administered at Facility: Substitution Permitted 0/09/5850 No Note to Pharmacy: Hand Signature: Date(s): Electronic  Signature(s) Signed: 01/06/2020 6:04:00 PM By: Worthy Keeler PA-C Entered By: Worthy Keeler on 01/05/2020 17:59:58 Haynie, [name] ([id number]) --------------------------------------------------------------------------------  Problem List Details Patient Name: Gloria Both A. Date of Service: 01/05/2020 10:15 AM Medical Record Number: [id number] Patient Account Number: [id number] Date of Birth/Sex: 1955/10/16 (64 y.o. Female) Treating RN: Army Melia Primary Care Provider: [name] Other Clinician: Referring Provider: [name] Treating Provider/Extender: Melburn Hake, Carlie Corpus Weeks in Treatment: 1 Active Problems ICD-10 Encounter Code Description Active Date MDM Diagnosis E11.621 Type 2 diabetes mellitus with foot ulcer 12/27/2019 No Yes L97.511 Non-pressure chronic ulcer of other part of right foot limited to 12/27/2019 No Yes breakdown of skin I10 Essential (primary) hypertension 12/27/2019 No Yes I50.42 Chronic combined systolic (congestive) and diastolic (congestive) heart 12/27/2019 No Yes failure Inactive Problems Resolved Problems Electronic Signature(s) Signed: 01/05/2020 10:27:00 AM By: Worthy Keeler PA-C Entered By: Worthy Keeler on 01/05/2020 10:27:00 Esty, Avalene [name] Kitchen ([id number]) -------------------------------------------------------------------------------- Progress Note Details Patient Name: Gloria Both A. Date of Service: 01/05/2020 10:15 AM Medical Record Number: [id number] Patient Account Number: [id number] Date of Birth/Sex: 05/26/1956 (64 y.o. Female) Treating RN: Army Melia Primary Care Provider: [name] Other Clinician: Referring Provider: [name] Treating Provider/Extender: Melburn Hake, Glorious Flicker Weeks in Treatment: 1 Subjective Chief Complaint Information obtained from Patient Right foot ulcer History of Present Illness (HPI) 12/27/2019 upon evaluation today patient appears to be doing somewhat poorly upon initial inspection  here in the clinic. She has unfortunately been having issues with for the past week a blister over her right heel that started on the plantar aspect and has spread to the medial aspect. She does have a history of diabetes mellitus type 2 she also has hypertension along with congestive heart failure. With that being said she was supposed to be having knee surgery on her right knee but this was postponed by the surgeon and she was referred to [place] due to the blister noted. Fortunately there is no signs of active infection at this time. With that being said I am concerned about the fact that this could develop into infection. Obviously we want to prevent such from happening. She does note that she been placed on a antibiotic for urinary tract infection by her primary care provider she has that to pick up. I asked her to call  and let us know what that antibiotic was so that we can put that in the chart. For that reason I am not can give her anything empirically at this point. The patient cannot remember any injury that she had to this region in fact she really does not know how this began at all other than the fact that it "just showed up". 01/05/2020 upon evaluation today patient appears to be doing about the same in regard to her heel ulcer. She has been tolerating the dressing changes without complication. Fortunately there is no signs of active infection at this time. No fevers, chills, nausea, vomiting, or diarrhea. With that being said I do believe the skin is getting need to be removed from the heel where this is somewhat deflated there is still a lot of blood collecting underneath and I think this is not can I do her any good to be perfectly honest. Plus we do not really know exactly what everything looks like underneath as far as the actual wound is concerned. Obviously we need to figure that out. Objective Constitutional Well-nourished and well-hydrated in no acute distress. Vitals Time Taken:  10:15 AM, Height: 62 in, Weight: 195 lbs, BMI: 35.7, Temperature: 97.9 F, Pulse: 76 bpm, Respiratory Rate: 16 breaths/min, Blood Pressure: 136/65 mmHg. Respiratory normal breathing without difficulty. Psychiatric this patient is able to make decisions and demonstrates good insight into disease process. Alert and Oriented x 3. pleasant and cooperative. General Notes: Upon inspection patient's wound actually did require significant debridement today in order to clear away some of the necrotic debris. Fortunately there did not appear to be any evidence of active infection at this time which is great news. I am very pleased in that regard. She did have a wound on the plantar aspect of her heel. That in my opinion is where all of this is emanating from. That area was much smaller than the entire region as a whole. This is good news as most of the area where the blood blister was under was actually new skin. This was however fairly extensive debridement. Integumentary (Hair, Skin) Wound #1 status is Open. Original cause of wound was Blister. The wound is located on the Right,Medial Foot. The wound measures 4cm length x 8cm width x 0.1cm depth; 25.133cm^2 area and 2.513cm^3 volume. The wound is limited to skin breakdown. There is no tunneling or undermining noted. There is a medium amount of serosanguineous drainage noted. The wound margin is flat and intact. There is no granulation within the wound bed. There is a large (67-100%) amount of necrotic tissue within the wound bed including Eschar. General Notes: intact blister that is reabsorbing, entire blackened area measured Seier, [name] ([id number]) Assessment Active Problems ICD-10 Type 2 diabetes mellitus with foot ulcer Non-pressure chronic ulcer of other part of right foot limited to breakdown of skin Essential (primary) hypertension Chronic combined systolic (congestive) and diastolic (congestive) heart failure Procedures Wound  #1 Pre-procedure diagnosis of Wound #1 is a Diabetic Wound/Ulcer of the Lower Extremity located on the Right,Medial Foot .Severity of Tissue Pre Debridement is: Fat layer exposed. There was a Excisional Skin/Subcutaneous Tissue Debridement with a total area of 32 sq cm performed by STONE III, Jamaya Sleeth E., PA-C. With the following instrument(s): Curette, Forceps, and Scissors to remove Viable and Non-Viable tissue/material. Material removed includes Callus, Subcutaneous Tissue, Slough, and Skin: Dermis after achieving pain control using Lidocaine. A time out was conducted at 10:33, prior to the start of the procedure. A Minimum  amount of bleeding was controlled with Pressure. The procedure was tolerated well with a pain level of 0 throughout and a pain level of 0 following the procedure. Post Debridement Measurements: 2.5cm length x 3cm width x 0.1cm depth; 0.589cm^3 volume. Character of Wound/Ulcer Post Debridement is improved. Severity of Tissue Post Debridement is: Fat layer exposed. Post procedure Diagnosis Wound #1: Same as Pre-Procedure Plan Wound Cleansing: Wound #1 Right,Medial Foot: May Shower, gently pat wound dry prior to applying new dressing. Primary Wound Dressing: Wound #1 Right,Medial Foot: Silver Alginate Secondary Dressing: Wound #1 Right,Medial Foot: ABD and Kerlix/Conform Dressing Change Frequency: Wound #1 Right,Medial Foot: Change dressing every other day. Follow-up Appointments: Wound #1 Right,Medial Foot: Return Appointment in 1 week. Edema Control: Elevate legs to the level of the heart and pump ankles as often as possible Other: - TubiGrip G Off-Loading: Wound #1 Right,Medial Foot: Open toe surgical shoe with peg assist. Laboratory ordered were: Wound culture routine - right foot The following medication(s) was prescribed: Augmentin oral 875 mg-125 mg tablet 1 1 tablet oral taken 2 times per day for 10 days starting 01/05/2020 1. I would recommend currently that  we go ahead and initiate treatment with a continuation of antibiotic therapy specifically with Augmentin which the patient did well with this point. She already had 5 days worth I will extend that for 10. 2. I am also going to recommend at this time that we continue with the silver alginate dressing. I do believe that this will be most beneficial for her. It will help with absorbing drainage. 3. I am also can recommend a PEG assist offloading shoe to help with obviously offloading and I did advise her as well she really should try to avoid walking if at all possible. We will see patient back for reevaluation in 1 week here in the clinic. If anything worsens or changes patient will contact our office for additional recommendations. THURZA, KWIECINSKI ([id number]) Electronic Signature(s) Signed: 01/05/2020 6:00:07 PM By: Worthy Keeler PA-C Entered By: Worthy Keeler on 01/05/2020 18:00:07 [place], [name] ([id number]) -------------------------------------------------------------------------------- SuperBill Details Patient Name: Gloria Both A. Date of Service: 01/05/2020 Medical Record Number: [id number] Patient Account Number: [id number] Date of Birth/Sex: 1956-03-13 (64 y.o. Female) Treating RN: Army Melia Primary Care Provider: [name] Other Clinician: Referring Provider: [name] Treating Provider/Extender: Melburn Hake, Gwenn Teodoro Weeks in Treatment: 1 Diagnosis Coding ICD-10 Codes Code Description E11.621 Type 2 diabetes mellitus with foot ulcer L97.511 Non-pressure chronic ulcer of other part of right foot limited to breakdown of skin I10 Essential (primary) hypertension I50.42 Chronic combined systolic (congestive) and diastolic (congestive) heart failure Facility Procedures CPT4 Code: [id number] Description: 11042 - DEB SUBQ TISSUE 20 SQ CM/< Modifier: Quantity: 1 CPT4 Code: Description: ICD-10 Diagnosis Description L97.511 Non-pressure chronic ulcer of other part of  right foot limited to breakd Modifier: own of skin Quantity: CPT4 Code: [id number] Description: 11045 - DEB SUBQ TISS EA ADDL 20CM Modifier: Quantity: 1 CPT4 Code: Description: ICD-10 Diagnosis Description L97.511 Non-pressure chronic ulcer of other part of right foot limited to breakd Modifier: own of skin Quantity: Physician Procedures CPT4 Code: [id number] Description: 99214 - WC PHYS LEVEL 4 - EST PT Modifier: 25 Quantity: 1 CPT4 Code: Description: ICD-10 Diagnosis Description E11.621 Type 2 diabetes mellitus with foot ulcer L97.511 Non-pressure chronic ulcer of other part of right foot limited to breakdo I10 Essential (primary) hypertension I50.42 Chronic combined systolic  (congestive) and diastolic (congestive) heart f Modifier: wn of skin ailure Quantity: CPT4 Code:  8828003 Description: 11042 - WC PHYS SUBQ TISS 20 SQ CM Modifier: Quantity: 1 CPT4 Code: Description: ICD-10 Diagnosis Description L97.511 Non-pressure chronic ulcer of other part of right foot limited to breakdo Modifier: wn of skin Quantity: CPT4 Code: 4917915 Description: 05697 - WC PHYS SUBQ TISS EA ADDL 20 CM Modifier: Quantity: 1 CPT4 Code: Description: ICD-10 Diagnosis Description L97.511 Non-pressure chronic ulcer of other part of right foot limited to breakdo Modifier: wn of skin Quantity: Electronic Signature(s) Signed: 01/05/2020 6:00:30 PM By: Worthy Keeler PA-C Entered By: Worthy Keeler on 01/05/2020 18:00:30

## 2020-01-05 NOTE — Progress Notes (Signed)
[name], [name] ([id number]) Visit Report for 01/05/2020 Arrival Information Details Patient Name: [name], [name] Date of Service: 01/05/2020 10:15 AM Medical Record Number: [id number] Patient Account Number: [id number] Date of Birth/Sex: 26-Aug-1955 (64 y.o. F) Treating RN: Montey Hora Primary Care Lavinia Mcneely: [name] Other Clinician: Referring Mialani Reicks: [name] Treating Greyden Besecker/Extender: Melburn Hake, HOYT Weeks in Treatment: 1 Visit Information History Since Last Visit Added or deleted any medications: No Patient Arrived: Wheel Chair Any new allergies or adverse reactions: No Arrival Time: 10:15 Had a fall or experienced change in No Accompanied By: caregiver activities of daily living that may affect Transfer Assistance: Manual risk of falls: Patient Identification Verified: Yes Signs or symptoms of abuse/neglect since last visito No Secondary Verification Process Completed: Yes Hospitalized since last visit: No Patient Has Alerts: Yes Implantable device outside of the clinic excluding No Patient Alerts: DMII cellular tissue based products placed in the center since last visit: Has Dressing in Place as Prescribed: Yes Pain Present Now: No Electronic Signature(s) Signed: 01/05/2020 4:34:20 PM By: Montey Hora Entered By: Montey Hora on 01/05/2020 10:15:48 [name], [name] ([id number]) -------------------------------------------------------------------------------- Encounter Discharge Information Details Patient Name: Gloria Both A. Date of Service: 01/05/2020 10:15 AM Medical Record Number: [id number] Patient Account Number: [id number] Date of Birth/Sex: 22-Mar-1956 (64 y.o. F) Treating RN: Army Melia Primary Care Panagiota Perfetti: [name] Other Clinician: Referring Chapin Arduini: [name] Treating Diamonte Stavely/Extender: Melburn Hake, HOYT Weeks in Treatment: 1 Encounter Discharge Information Items Post Procedure Vitals Discharge Condition:  Stable Temperature (F): 97.9 Ambulatory Status: Wheelchair Pulse (bpm): 76 Discharge Destination: Home Respiratory Rate (breaths/min): 16 Transportation: Private Auto Blood Pressure (mmHg): 136/65 Accompanied By: family Schedule Follow-up Appointment: Yes Clinical Summary of Care: Electronic Signature(s) Signed: 01/05/2020 11:31:22 AM By: Army Melia Entered By: Army Melia on 01/05/2020 10:46:23 [name], [name] ([id number]) -------------------------------------------------------------------------------- Lower Extremity Assessment Details Patient Name: Gloria Both A. Date of Service: 01/05/2020 10:15 AM Medical Record Number: [id number] Patient Account Number: [id number] Date of Birth/Sex: 27-Dec-1955 (64 y.o. F) Treating RN: Montey Hora Primary Care Tramain Gershman: [name] Other Clinician: Referring Valery Amedee: [name] Treating Diksha Tagliaferro/Extender: STONE III, HOYT Weeks in Treatment: 1 Edema Assessment Assessed: [Left: No] [Right: No] Edema: [Left: Ye] [Right: s] Vascular Assessment Pulses: Dorsalis Pedis Palpable: [Right:Yes] Electronic Signature(s) Signed: 01/05/2020 4:34:20 PM By: Montey Hora Entered By: Montey Hora on 01/05/2020 10:24:15 [name], [name] Kitchen ([id number]) -------------------------------------------------------------------------------- Multi Wound Chart Details Patient Name: Gloria Both A. Date of Service: 01/05/2020 10:15 AM Medical Record Number: [id number] Patient Account Number: [id number] Date of Birth/Sex: 1956/05/13 (64 y.o. F) Treating RN: Army Melia Primary Care Andre Swander: [name] Other Clinician: Referring Gavino Fouch: [name] Treating Bryan Omura/Extender: Melburn Hake, HOYT Weeks in Treatment: 1 Vital Signs Height(in): 62 Pulse(bpm): 55 Weight(lbs): 195 Blood Pressure(mmHg): 136/65 Body Mass Index(BMI): 36 Temperature(F): 97.9 Respiratory Rate(breaths/min): 16 Photos: [N/A:N/A] Wound Location: Right, Medial  Foot N/A N/A Wounding Event: Blister N/A N/A Primary Etiology: Diabetic Wound/Ulcer of the Lower N/A N/A Extremity Comorbid History: Cataracts, Asthma, Congestive N/A N/A Heart Failure, Hypertension, Type II Diabetes, Received Chemotherapy, Received Radiation Date Acquired: 12/20/2019 N/A N/A Weeks of Treatment: 1 N/A N/A Wound Status: Open N/A N/A Measurements L x W x D (cm) 4x8x0.1 N/A N/A Area (cm) : 25.133 N/A N/A Volume (cm) : 2.513 N/A N/A % Reduction in Area: -42.20% N/A N/A % Reduction in Volume: -42.20% N/A N/A Classification: Grade 2 N/A N/A Exudate Amount: Medium N/A N/A Exudate Type: Serosanguineous N/A N/A Exudate Color: red, brown N/A N/A Wound Margin: Flat and Intact  N/A N/A Granulation Amount: None Present (0%) N/A N/A Necrotic Amount: Large (67-100%) N/A N/A Necrotic Tissue: Eschar N/A N/A Exposed Structures: Fascia: No N/A N/A Fat Layer (Subcutaneous Tissue) Exposed: No Tendon: No Muscle: No Joint: No Bone: No Limited to Skin Breakdown Epithelialization: Large (67-100%) N/A N/A Assessment Notes: intact blister that is reabsorbing, N/A N/A entire blackened area measured Treatment Notes Electronic Signature(s) Signed: 01/05/2020 11:31:22 AM By: [name], [name] ([id number]) Entered By: Army Melia on 01/05/2020 10:31:50 [name] ([id number]) -------------------------------------------------------------------------------- Multi-Disciplinary Care Plan Details Patient Name: Gloria Both A. Date of Service: 01/05/2020 10:15 AM Medical Record Number: [id number] Patient Account Number: [id number] Date of Birth/Sex: 21-Jan-1956 (64 y.o. F) Treating RN: Army Melia Primary Care Ariyona Eid: [name] Other Clinician: Referring Jerret Mcbane: [name] Treating Tayla Panozzo/Extender: Melburn Hake, HOYT Weeks in Treatment: 1 Active Inactive Abuse / Safety / Falls / Self Care Management Nursing Diagnoses: Potential for  falls Goals: Patient will not experience any injury related to falls Date Initiated: 12/27/2019 Target Resolution Date: 03/10/2020 Goal Status: Active Interventions: Assess fall risk on admission and as needed Notes: Nutrition Nursing Diagnoses: Impaired glucose control: actual or potential Goals: Patient/caregiver agrees to and verbalizes understanding of need to use nutritional supplements and/or vitamins as prescribed Date Initiated: 12/27/2019 Target Resolution Date: 03/10/2020 Goal Status: Active Interventions: Assess patient nutrition upon admission and as needed per policy Notes: Orientation to the Wound Care Program Nursing Diagnoses: Knowledge deficit related to the wound healing center program Goals: Patient/caregiver will verbalize understanding of the [facility] Date Initiated: 12/27/2019 Target Resolution Date: 03/10/2020 Goal Status: Active Interventions: Provide education on orientation to the wound center Notes: Wound/Skin Impairment Nursing Diagnoses: Impaired tissue integrity Goals: Ulcer/skin breakdown will heal within 14 weeks Date Initiated: 12/27/2019 Target Resolution Date: 03/10/2020 Goal Status: Active KADELYN, DIMASCIO A. ([id number]) Interventions: Assess patient/caregiver ability to obtain necessary supplies Assess patient/caregiver ability to perform ulcer/skin care regimen upon admission and as needed Assess ulceration(s) every visit Notes: Electronic Signature(s) Signed: 01/05/2020 11:31:22 AM By: Army Melia Entered By: Army Melia on 01/05/2020 10:31:28 [name], [name] Kitchen ([id number]) -------------------------------------------------------------------------------- Pain Assessment Details Patient Name: Gloria Both A. Date of Service: 01/05/2020 10:15 AM Medical Record Number: [id number] Patient Account Number: [id number] Date of Birth/Sex: 08/30/1955 (64 y.o. F) Treating RN: Montey Hora Primary Care Torah Pinnock: [name]  Other Clinician: Referring Yocelin Vanlue: [name] Treating Richmond Coldren/Extender: Melburn Hake, HOYT Weeks in Treatment: 1 Active Problems Location of Pain Severity and Description of Pain Patient Has Paino No Site Locations Pain Management and Medication Current Pain Management: Electronic Signature(s) Signed: 01/05/2020 4:34:20 PM By: Montey Hora Entered By: Montey Hora on 01/05/2020 10:19:17 [name] ([id number]) -------------------------------------------------------------------------------- Patient/Caregiver Education Details Patient Name: Gloria Both A. Date of Service: 01/05/2020 10:15 AM Medical Record Number: [id number] Patient Account Number: [id number] Date of Birth/Gender: 1955/11/24 (64 y.o. F) Treating RN: Army Melia Primary Care Physician: [name] Other Clinician: Referring Physician: [name] Treating Physician/Extender: Sharalyn Ink in Treatment: 1 Education Assessment Education Provided To: Patient Education Topics Provided Wound/Skin Impairment: Handouts: Caring for Your Ulcer Methods: Demonstration, Explain/Verbal Responses: State content correctly Electronic Signature(s) Signed: 01/05/2020 11:31:22 AM By: Army Melia Entered By: Army Melia on 01/05/2020 10:45:13 [name], [name] Kitchen ([id number]) -------------------------------------------------------------------------------- Wound Assessment Details Patient Name: Gloria Both A. Date of Service: 01/05/2020 10:15 AM Medical Record Number: [id number] Patient Account Number: [id number] Date of Birth/Sex: 1956/04/15 (64 y.o. F) Treating RN: Montey Hora Primary Care Ciclaly Mulcahey: [name] Other Clinician: Referring Jhade Berko: Ginette Pitman,  Vishwanath Treating Graylin Sperling/Extender: STONE III, HOYT Weeks in Treatment: 1 Wound Status Wound Number: 1 Primary Diabetic Wound/Ulcer of the Lower Extremity Etiology: Wound Location: Right, Medial Foot Wound Open Wounding Event:  Blister Status: Date Acquired: 12/20/2019 Comorbid Cataracts, Asthma, Congestive Heart Failure, Weeks Of Treatment: 1 History: Hypertension, Type II Diabetes, Received Chemotherapy, Clustered Wound: No Received Radiation Photos Wound Measurements Length: (cm) 4 Width: (cm) 8 Depth: (cm) 0.1 Area: (cm) 25.133 Volume: (cm) 2.513 % Reduction in Area: -42.2% % Reduction in Volume: -42.2% Epithelialization: Large (67-100%) Tunneling: No Undermining: No Wound Description Classification: Grade 2 Wound Margin: Flat and Intact Exudate Amount: Medium Exudate Type: Serosanguineous Exudate Color: red, brown Foul Odor After Cleansing: No Slough/Fibrino Yes Wound Bed Granulation Amount: None Present (0%) Exposed Structure Necrotic Amount: Large (67-100%) Fascia Exposed: No Necrotic Quality: Eschar Fat Layer (Subcutaneous Tissue) Exposed: No Tendon Exposed: No Muscle Exposed: No Joint Exposed: No Bone Exposed: No Limited to Skin Breakdown Assessment Notes intact blister that is reabsorbing, entire blackened area measured Treatment Notes Wound #1 (Right, Medial Foot) [name], [name] ([id number]) Notes scell, abd, kerlix and tubigrip g, peg assist Electronic Signature(s) Signed: 01/05/2020 4:34:20 PM By: Montey Hora Entered By: Montey Hora on 01/05/2020 10:23:52 [name], [name] ([id number]) -------------------------------------------------------------------------------- Vitals Details Patient Name: Gloria Both A. Date of Service: 01/05/2020 10:15 AM Medical Record Number: [id number] Patient Account Number: [id number] Date of Birth/Sex: October 25, 1955 (64 y.o. F) Treating RN: Montey Hora Primary Care Reynalda Canny: [name] Other Clinician: Referring Yadir Zentner: [name] Treating Anglea Gordner/Extender: Melburn Hake, HOYT Weeks in Treatment: 1 Vital Signs Time Taken: 10:15 Temperature (F): 97.9 Height (in): 62 Pulse (bpm): 76 Weight (lbs): 195 Respiratory Rate  (breaths/min): 16 Body Mass Index (BMI): 35.7 Blood Pressure (mmHg): 136/65 Reference Range: 80 - 120 mg / dl Electronic Signature(s) Signed: 01/05/2020 4:34:20 PM By: Montey Hora Entered By: Montey Hora on 01/05/2020 10:19:10

## 2020-01-06 ENCOUNTER — Other Ambulatory Visit
Admission: RE | Admit: 2020-01-06 | Discharge: 2020-01-06 | Disposition: A | Discharge status: Home / Self Care | Payer: Medicare HMO | Source: Ambulatory Visit | Attending: Physician Assistant | Admitting: Physician Assistant

## 2020-01-06 DIAGNOSIS — L089 Local infection of the skin and subcutaneous tissue, unspecified: Secondary | ICD-10-CM | POA: Insufficient documentation

## 2020-01-06 NOTE — Progress Notes (Signed)
Pharmacist Chemotherapy Monitoring - Follow Up Assessment    I verify that I have reviewed each item in the below checklist:  . Regimen for the patient is scheduled for the appropriate day and plan matches scheduled date. Marland Kitchen Appropriate non-routine labs are ordered dependent on drug ordered. . If applicable, additional medications reviewed and ordered per protocol based on lifetime cumulative doses and/or treatment regimen.   Plan for follow-up and/or issues identified: No . I-vent associated with next due treatment: No . MD and/or nursing notified: No  Lisaanne Lawrie K 01/06/2020 8:41 AM

## 2020-01-09 LAB — AEROBIC CULTURE W GRAM STAIN (SUPERFICIAL SPECIMEN)
Culture: NORMAL
Gram Stain: NONE SEEN

## 2020-01-12 ENCOUNTER — Encounter: Payer: Medicare HMO | Admitting: Physician Assistant

## 2020-01-12 ENCOUNTER — Other Ambulatory Visit: Payer: Self-pay

## 2020-01-12 ENCOUNTER — Encounter: Payer: Self-pay | Admitting: Internal Medicine

## 2020-01-12 DIAGNOSIS — Z9221 Personal history of antineoplastic chemotherapy: Secondary | ICD-10-CM | POA: Diagnosis not present

## 2020-01-12 DIAGNOSIS — I110 Hypertensive heart disease with heart failure: Secondary | ICD-10-CM | POA: Diagnosis not present

## 2020-01-12 DIAGNOSIS — I5042 Chronic combined systolic (congestive) and diastolic (congestive) heart failure: Secondary | ICD-10-CM | POA: Diagnosis not present

## 2020-01-12 DIAGNOSIS — Z923 Personal history of irradiation: Secondary | ICD-10-CM | POA: Diagnosis not present

## 2020-01-12 DIAGNOSIS — L97511 Non-pressure chronic ulcer of other part of right foot limited to breakdown of skin: Secondary | ICD-10-CM | POA: Diagnosis not present

## 2020-01-12 DIAGNOSIS — E11621 Type 2 diabetes mellitus with foot ulcer: Secondary | ICD-10-CM | POA: Diagnosis not present

## 2020-01-12 NOTE — Progress Notes (Addendum)
[name], [name] ([id number]) Visit Report for 01/12/2020 Chief Complaint Document Details Patient Name: [name], [name] Date of Service: 01/12/2020 10:15 AM Medical Record Number: [id number] Patient Account Number: [id number] Date of Birth/Sex: 10-Apr-1956 (64 y.o. F) Treating RN: Army Melia Primary Care Provider: [name] Other Clinician: Referring Provider: [name] Treating Provider/Extender: Melburn Hake, Karlena Luebke Weeks in Treatment: 2 Information Obtained from: Patient Chief Complaint Right foot ulcer Electronic Signature(s) Signed: 01/12/2020 10:48:15 AM By: Worthy Keeler PA-C Entered By: Worthy Keeler on 01/12/2020 10:48:15 [name], [name] ([id number]) -------------------------------------------------------------------------------- HPI Details Patient Name: Gloria Both A. Date of Service: 01/12/2020 10:15 AM Medical Record Number: [id number] Patient Account Number: [id number] Date of Birth/Sex: [birth year] (64 y.o. F) Treating RN: Army Melia Primary Care Provider: [name] Other Clinician: Referring Provider: [name] Treating Provider/Extender: Melburn Hake, Meloney Feld Weeks in Treatment: 2 History of Present Illness HPI Description: 12/27/2019 upon evaluation today patient appears to be doing somewhat poorly upon initial inspection here in the clinic. She has unfortunately been having issues with for the past week a blister over her right heel that started on the plantar aspect and has spread to the medial aspect. She does have a history of diabetes mellitus type 2 she also has hypertension along with congestive heart failure. With that being said she was supposed to be having knee surgery on her right knee but this was postponed by the surgeon and she was referred to [place] due to the blister noted. Fortunately there is no signs of active infection at this time. With that being said I am concerned about the fact that this could develop into infection.  Obviously we want to prevent such from happening. She does note that she been placed on a antibiotic for urinary tract infection by her primary care provider she has that to pick up. I asked her to call and let us know what that antibiotic was so that we can put that in the chart. For that reason I am not can give her anything empirically at this point. The patient cannot remember any injury that she had to this region in fact she really does not know how this began at all other than the fact that it "just showed up". 01/05/2020 upon evaluation today patient appears to be doing about the same in regard to her heel ulcer. She has been tolerating the dressing changes without complication. Fortunately there is no signs of active infection at this time. No fevers, chills, nausea, vomiting, or diarrhea. With that being said I do believe the skin is getting need to be removed from the heel where this is somewhat deflated there is still a lot of blood collecting underneath and I think this is not can I do her any good to be perfectly honest. Plus we do not really know exactly what everything looks like underneath as far as the actual wound is concerned. Obviously we need to figure that out. 01/12/2020 upon evaluation today patient's wound actually appears to be doing excellent in fact this is very close to complete resolution. Subsequently I also did send a culture after removing the blood blister last week and the patient did not have any bacteria noted just normal skin flora and no growth otherwise after 2 days. Fortunately there is no signs of active infection at this time systemically or locally. Overall I feel that she is doing excellent. Electronic Signature(s) Signed: 01/12/2020 10:56:22 AM By: Worthy Keeler PA-C Entered By: Worthy Keeler on 01/12/2020 10:56:21  [name], [name] ([id number]) -------------------------------------------------------------------------------- Physical Exam  Details Patient Name: [name], [name] Date of Service: 01/12/2020 10:15 AM Medical Record Number: [id number] Patient Account Number: [id number] Date of Birth/Sex: 05-09-1956 (64 y.o. F) Treating RN: Army Melia Primary Care Provider: [name] Other Clinician: Referring Provider: [name] Treating Provider/Extender: STONE III, Alysabeth Scalia Weeks in Treatment: 2 Constitutional Well-nourished and well-hydrated in no acute distress. Respiratory normal breathing without difficulty. Psychiatric this patient is able to make decisions and demonstrates good insight into disease process. Alert and Oriented x 3. pleasant and cooperative. Notes Upon inspection patient's wound bed actually showed signs of excellent granulation and epithelization there does not appear to be any evidence of active infection overall very pleased with where things stand. I do believe that she would be appropriate to proceed with her surgery I see again no evidence of infection even after culture as well as visually and I think this is really no inherent risk for her in my opinion. Electronic Signature(s) Signed: 01/12/2020 10:56:46 AM By: Worthy Keeler PA-C Entered By: Worthy Keeler on 01/12/2020 10:56:46 [name], [name] ([id number]) -------------------------------------------------------------------------------- Physician Orders Details Patient Name: Gloria Both A. Date of Service: 01/12/2020 10:15 AM Medical Record Number: [id number] Patient Account Number: [id number] Date of Birth/Sex: 08-11-1955 (64 y.o. F) Treating RN: Army Melia Primary Care Provider: [name] Other Clinician: Referring Provider: [name] Treating Provider/Extender: Melburn Hake, Micah Galeno Weeks in Treatment: 2 Verbal / Phone Orders: No Diagnosis Coding ICD-10 Coding Code Description E11.621 Type 2 diabetes mellitus with foot ulcer L97.511 Non-pressure chronic ulcer of other part of right foot limited to breakdown of  skin I10 Essential (primary) hypertension I50.42 Chronic combined systolic (congestive) and diastolic (congestive) heart failure Wound Cleansing Wound #1 Right,Medial Foot o May Shower, gently pat wound dry prior to applying new dressing. Primary Wound Dressing Wound #1 Right,Medial Foot o Silver Alginate Secondary Dressing Wound #1 Right,Medial Foot o ABD and Kerlix/Conform Dressing Change Frequency Wound #1 Right,Medial Foot o Change dressing every other day. Follow-up Appointments Wound #1 Right,Medial Foot o Return Appointment in 1 week. Edema Control o Elevate legs to the level of the heart and pump ankles as often as possible o Other: - TubiGrip G Off-Loading Wound #1 Right,Medial Foot o Open toe surgical shoe with peg assist. Electronic Signature(s) Signed: 01/12/2020 11:13:05 AM By: Army Melia Signed: 01/12/2020 4:30:42 PM By: Worthy Keeler PA-C Entered By: Army Melia on 01/12/2020 10:52:29 [name], [name] Kitchen ([id number]) -------------------------------------------------------------------------------- Problem List Details Patient Name: Gloria Both A. Date of Service: 01/12/2020 10:15 AM Medical Record Number: [id number] Patient Account Number: [id number] Date of Birth/Sex: May 29, 1956 (64 y.o. F) Treating RN: Army Melia Primary Care Provider: [name] Other Clinician: Referring Provider: [name] Treating Provider/Extender: Melburn Hake, Kayo Zion Weeks in Treatment: 2 Active Problems ICD-10 Encounter Code Description Active Date MDM Diagnosis E11.621 Type 2 diabetes mellitus with foot ulcer 12/27/2019 No Yes L97.511 Non-pressure chronic ulcer of other part of right foot limited to 12/27/2019 No Yes breakdown of skin I10 Essential (primary) hypertension 12/27/2019 No Yes I50.42 Chronic combined systolic (congestive) and diastolic (congestive) heart 12/27/2019 No Yes failure Inactive Problems Resolved Problems Electronic  Signature(s) Signed: 01/12/2020 10:16:51 AM By: Worthy Keeler PA-C Entered By: Worthy Keeler on 01/12/2020 10:16:50 [name], [name] Kitchen ([id number]) -------------------------------------------------------------------------------- Progress Note Details Patient Name: Gloria Both A. Date of Service: 01/12/2020 10:15 AM Medical Record Number: [id number] Patient Account Number: [id number] Date of Birth/Sex: November 24, 1955 (64 y.o. F) Treating RN: Army Melia Primary Care Provider:  Hande, Vishwanath Other Clinician: Referring Provider: [name] Treating Provider/Extender: Melburn Hake, Misa Fedorko Weeks in Treatment: 2 Subjective Chief Complaint Information obtained from Patient Right foot ulcer History of Present Illness (HPI) 12/27/2019 upon evaluation today patient appears to be doing somewhat poorly upon initial inspection here in the clinic. She has unfortunately been having issues with for the past week a blister over her right heel that started on the plantar aspect and has spread to the medial aspect. She does have a history of diabetes mellitus type 2 she also has hypertension along with congestive heart failure. With that being said she was supposed to be having knee surgery on her right knee but this was postponed by the surgeon and she was referred to [place] due to the blister noted. Fortunately there is no signs of active infection at this time. With that being said I am concerned about the fact that this could develop into infection. Obviously we want to prevent such from happening. She does note that she been placed on a antibiotic for urinary tract infection by her primary care provider she has that to pick up. I asked her to call and let us know what that antibiotic was so that we can put that in the chart. For that reason I am not can give her anything empirically at this point. The patient cannot remember any injury that she had to this region in fact she really does not know how this  began at all other than the fact that it "just showed up". 01/05/2020 upon evaluation today patient appears to be doing about the same in regard to her heel ulcer. She has been tolerating the dressing changes without complication. Fortunately there is no signs of active infection at this time. No fevers, chills, nausea, vomiting, or diarrhea. With that being said I do believe the skin is getting need to be removed from the heel where this is somewhat deflated there is still a lot of blood collecting underneath and I think this is not can I do her any good to be perfectly honest. Plus we do not really know exactly what everything looks like underneath as far as the actual wound is concerned. Obviously we need to figure that out. 01/12/2020 upon evaluation today patient's wound actually appears to be doing excellent in fact this is very close to complete resolution. Subsequently I also did send a culture after removing the blood blister last week and the patient did not have any bacteria noted just normal skin flora and no growth otherwise after 2 days. Fortunately there is no signs of active infection at this time systemically or locally. Overall I feel that she is doing excellent. Objective Constitutional Well-nourished and well-hydrated in no acute distress. Vitals Time Taken: 10:19 AM, Height: 62 in, Weight: 195 lbs, BMI: 35.7, Temperature: 97.8 F, Pulse: 74 bpm, Respiratory Rate: 16 breaths/min, Blood Pressure: 138/77 mmHg. Respiratory normal breathing without difficulty. Psychiatric this patient is able to make decisions and demonstrates good insight into disease process. Alert and Oriented x 3. pleasant and cooperative. General Notes: Upon inspection patient's wound bed actually showed signs of excellent granulation and epithelization there does not appear to be any evidence of active infection overall very pleased with where things stand. I do believe that she would be appropriate to proceed  with her surgery I see again no evidence of infection even after culture as well as visually and I think this is really no inherent risk for her in my opinion. Integumentary (  Hair, Skin) Wound #1 status is Open. Original cause of wound was Blister. The wound is located on the Right,Medial Foot. The wound measures 1.4cm length x 2.1cm width x 0.1cm depth; 2.309cm^2 area and 0.231cm^3 volume. There is Fat Layer (Subcutaneous Tissue) Exposed exposed. There is no tunneling or undermining noted. There is a medium amount of serosanguineous drainage noted. The wound margin is flat and intact. There is large (67-100%) granulation within the wound bed. There is a small (1-33%) amount of necrotic tissue within the wound bed including Adherent Slough. [name], [name] ([id number]) Assessment Active Problems ICD-10 Type 2 diabetes mellitus with foot ulcer Non-pressure chronic ulcer of other part of right foot limited to breakdown of skin Essential (primary) hypertension Chronic combined systolic (congestive) and diastolic (congestive) heart failure Plan Wound Cleansing: Wound #1 Right,Medial Foot: May Shower, gently pat wound dry prior to applying new dressing. Primary Wound Dressing: Wound #1 Right,Medial Foot: Silver Alginate Secondary Dressing: Wound #1 Right,Medial Foot: ABD and Kerlix/Conform Dressing Change Frequency: Wound #1 Right,Medial Foot: Change dressing every other day. Follow-up Appointments: Wound #1 Right,Medial Foot: Return Appointment in 1 week. Edema Control: Elevate legs to the level of the heart and pump ankles as often as possible Other: - TubiGrip G Off-Loading: Wound #1 Right,Medial Foot: Open toe surgical shoe with peg assist. 1. I would recommend currently that we go ahead and continue with a silver alginate dressing to the heel also recommended that she continue to avoid walking on this is much as possible that will help this to heal most effectively. 2. I am  also going to suggest that we continue with the offloading shoe for when she does have to ambulate so that she does not put any pressure on the heel. 3. I also would advise that I do not feel like this wound poses any infectious risk to the patient proceeding with her knee surgeries. Obviously I will contact [facility] to discuss this with them as well but at the same time I will also send this note copy if need be stating the fact as well. But from the standpoint of the negative wound culture coupled with the fact that the wound appears to show no signs of infection and has healed dramatically even since last week I feel like this poses no serious risk to the patient as far as infection is concerned with surgery. We will see patient back for reevaluation in 1 week here in the clinic. If anything worsens or changes patient will contact our office for additional recommendations. Electronic Signature(s) Signed: 01/12/2020 10:58:19 AM By: Worthy Keeler PA-C Entered By: Worthy Keeler on 01/12/2020 10:58:19 [name], [name] ([id number]) -------------------------------------------------------------------------------- SuperBill Details Patient Name: Gloria Both A. Date of Service: 01/12/2020 Medical Record Number: [id number] Patient Account Number: [id number] Date of Birth/Sex: 02/07/1956 (64 y.o. F) Treating RN: Army Melia Primary Care Provider: [name] Other Clinician: Referring Provider: [name] Treating Provider/Extender: Melburn Hake, Atsushi Yom Weeks in Treatment: 2 Diagnosis Coding ICD-10 Codes Code Description E11.621 Type 2 diabetes mellitus with foot ulcer L97.511 Non-pressure chronic ulcer of other part of right foot limited to breakdown of skin I10 Essential (primary) hypertension I50.42 Chronic combined systolic (congestive) and diastolic (congestive) heart failure Facility Procedures CPT4 Code: [id number] Description: 99213 - WOUND CARE VISIT-LEV 3 EST  PT Modifier: Quantity: 1 Physician Procedures CPT4 Code: 6301601 Description: 99213 - WC PHYS LEVEL 3 - EST PT Modifier: Quantity: 1 CPT4 Code: Description: ICD-10 Diagnosis Description E11.621 Type 2 diabetes mellitus with foot  ulcer L97.511 Non-pressure chronic ulcer of other part of right foot limited to break I10 Essential (primary) hypertension I50.42 Chronic combined systolic (congestive)  and diastolic (congestive) heart Modifier: down of skin failure Quantity: Electronic Signature(s) Signed: 01/12/2020 10:58:33 AM By: Worthy Keeler PA-C Entered By: Worthy Keeler on 01/12/2020 10:58:33

## 2020-01-13 ENCOUNTER — Inpatient Hospital Stay (HOSPITAL_BASED_OUTPATIENT_CLINIC_OR_DEPARTMENT_OTHER): Payer: Medicare HMO | Admitting: Internal Medicine

## 2020-01-13 ENCOUNTER — Inpatient Hospital Stay: Payer: Medicare HMO | Attending: Internal Medicine

## 2020-01-13 ENCOUNTER — Inpatient Hospital Stay: Payer: Medicare HMO

## 2020-01-13 VITALS — BP 125/80 | HR 66 | Resp 16

## 2020-01-13 DIAGNOSIS — J45909 Unspecified asthma, uncomplicated: Secondary | ICD-10-CM | POA: Diagnosis not present

## 2020-01-13 DIAGNOSIS — I509 Heart failure, unspecified: Secondary | ICD-10-CM | POA: Insufficient documentation

## 2020-01-13 DIAGNOSIS — N184 Chronic kidney disease, stage 4 (severe): Secondary | ICD-10-CM | POA: Insufficient documentation

## 2020-01-13 DIAGNOSIS — Z87891 Personal history of nicotine dependence: Secondary | ICD-10-CM | POA: Insufficient documentation

## 2020-01-13 DIAGNOSIS — Z7982 Long term (current) use of aspirin: Secondary | ICD-10-CM | POA: Insufficient documentation

## 2020-01-13 DIAGNOSIS — E119 Type 2 diabetes mellitus without complications: Secondary | ICD-10-CM | POA: Insufficient documentation

## 2020-01-13 DIAGNOSIS — D649 Anemia, unspecified: Secondary | ICD-10-CM | POA: Insufficient documentation

## 2020-01-13 DIAGNOSIS — C7951 Secondary malignant neoplasm of bone: Secondary | ICD-10-CM | POA: Insufficient documentation

## 2020-01-13 DIAGNOSIS — Z8041 Family history of malignant neoplasm of ovary: Secondary | ICD-10-CM | POA: Insufficient documentation

## 2020-01-13 DIAGNOSIS — Z8042 Family history of malignant neoplasm of prostate: Secondary | ICD-10-CM | POA: Insufficient documentation

## 2020-01-13 DIAGNOSIS — Z800 Family history of malignant neoplasm of digestive organs: Secondary | ICD-10-CM | POA: Insufficient documentation

## 2020-01-13 DIAGNOSIS — Z7951 Long term (current) use of inhaled steroids: Secondary | ICD-10-CM | POA: Diagnosis not present

## 2020-01-13 DIAGNOSIS — F329 Major depressive disorder, single episode, unspecified: Secondary | ICD-10-CM | POA: Diagnosis not present

## 2020-01-13 DIAGNOSIS — Z923 Personal history of irradiation: Secondary | ICD-10-CM | POA: Diagnosis not present

## 2020-01-13 DIAGNOSIS — I5022 Chronic systolic (congestive) heart failure: Secondary | ICD-10-CM | POA: Diagnosis not present

## 2020-01-13 DIAGNOSIS — Z803 Family history of malignant neoplasm of breast: Secondary | ICD-10-CM | POA: Diagnosis not present

## 2020-01-13 DIAGNOSIS — Z806 Family history of leukemia: Secondary | ICD-10-CM | POA: Diagnosis not present

## 2020-01-13 DIAGNOSIS — I110 Hypertensive heart disease with heart failure: Secondary | ICD-10-CM | POA: Insufficient documentation

## 2020-01-13 DIAGNOSIS — C50212 Malignant neoplasm of upper-inner quadrant of left female breast: Secondary | ICD-10-CM

## 2020-01-13 DIAGNOSIS — Z801 Family history of malignant neoplasm of trachea, bronchus and lung: Secondary | ICD-10-CM | POA: Diagnosis not present

## 2020-01-13 DIAGNOSIS — Z79899 Other long term (current) drug therapy: Secondary | ICD-10-CM | POA: Insufficient documentation

## 2020-01-13 DIAGNOSIS — Z170 Estrogen receptor positive status [ER+]: Secondary | ICD-10-CM | POA: Diagnosis not present

## 2020-01-13 DIAGNOSIS — Z5111 Encounter for antineoplastic chemotherapy: Secondary | ICD-10-CM | POA: Diagnosis not present

## 2020-01-13 DIAGNOSIS — E1142 Type 2 diabetes mellitus with diabetic polyneuropathy: Secondary | ICD-10-CM | POA: Diagnosis not present

## 2020-01-13 DIAGNOSIS — I130 Hypertensive heart and chronic kidney disease with heart failure and stage 1 through stage 4 chronic kidney disease, or unspecified chronic kidney disease: Secondary | ICD-10-CM | POA: Diagnosis not present

## 2020-01-13 DIAGNOSIS — F419 Anxiety disorder, unspecified: Secondary | ICD-10-CM | POA: Diagnosis not present

## 2020-01-13 DIAGNOSIS — Z7189 Other specified counseling: Secondary | ICD-10-CM

## 2020-01-13 DIAGNOSIS — E1122 Type 2 diabetes mellitus with diabetic chronic kidney disease: Secondary | ICD-10-CM | POA: Diagnosis not present

## 2020-01-13 DIAGNOSIS — D631 Anemia in chronic kidney disease: Secondary | ICD-10-CM | POA: Diagnosis not present

## 2020-01-13 LAB — CBC WITH DIFFERENTIAL/PLATELET
Abs Immature Granulocytes: 0.02 10*3/uL (ref 0.00–0.07)
Basophils Absolute: 0.1 10*3/uL (ref 0.0–0.1)
Basophils Relative: 1 %
Eosinophils Absolute: 0.1 10*3/uL (ref 0.0–0.5)
Eosinophils Relative: 2 %
HCT: 26.4 % — ABNORMAL LOW (ref 36.0–46.0)
Hemoglobin: 8.9 g/dL — ABNORMAL LOW (ref 12.0–15.0)
Immature Granulocytes: 0 %
Lymphocytes Relative: 15 %
Lymphs Abs: 0.9 10*3/uL (ref 0.7–4.0)
MCH: 31.4 pg (ref 26.0–34.0)
MCHC: 33.7 g/dL (ref 30.0–36.0)
MCV: 93.3 fL (ref 80.0–100.0)
Monocytes Absolute: 0.6 10*3/uL (ref 0.1–1.0)
Monocytes Relative: 10 %
Neutro Abs: 4.7 10*3/uL (ref 1.7–7.7)
Neutrophils Relative %: 72 %
Platelets: 265 10*3/uL (ref 150–400)
RBC: 2.83 MIL/uL — ABNORMAL LOW (ref 3.87–5.11)
RDW: 12.8 % (ref 11.5–15.5)
WBC: 6.4 10*3/uL (ref 4.0–10.5)
nRBC: 0 % (ref 0.0–0.2)

## 2020-01-13 LAB — COMPREHENSIVE METABOLIC PANEL
ALT: 14 U/L (ref 0–44)
AST: 15 U/L (ref 15–41)
Albumin: 3.4 g/dL — ABNORMAL LOW (ref 3.5–5.0)
Alkaline Phosphatase: 57 U/L (ref 38–126)
Anion gap: 10 (ref 5–15)
BUN: 44 mg/dL — ABNORMAL HIGH (ref 8–23)
CO2: 22 mmol/L (ref 22–32)
Calcium: 8.5 mg/dL — ABNORMAL LOW (ref 8.9–10.3)
Chloride: 104 mmol/L (ref 98–111)
Creatinine, Ser: 2.77 mg/dL — ABNORMAL HIGH (ref 0.44–1.00)
GFR calc Af Amer: 20 mL/min — ABNORMAL LOW (ref >60)
GFR calc non Af Amer: 18 mL/min — ABNORMAL LOW (ref >60)
Glucose, Bld: 198 mg/dL — ABNORMAL HIGH (ref 70–99)
Potassium: 4.3 mmol/L (ref 3.5–5.1)
Sodium: 136 mmol/L (ref 135–145)
Total Bilirubin: 0.6 mg/dL (ref 0.3–1.2)
Total Protein: 7 g/dL (ref 6.5–8.1)

## 2020-01-13 MED ORDER — SODIUM CHLORIDE 0.9% FLUSH
10.0000 mL | Freq: Once | INTRAVENOUS | Status: AC
  Administered 2020-01-13: 10 mL via INTRAVENOUS
  Filled 2020-01-13: qty 10

## 2020-01-13 MED ORDER — PROCHLORPERAZINE MALEATE 10 MG PO TABS
10.0000 mg | ORAL_TABLET | Freq: Once | ORAL | Status: AC
  Administered 2020-01-13: 10 mg via ORAL
  Filled 2020-01-13: qty 1

## 2020-01-13 MED ORDER — SODIUM CHLORIDE 0.9 % IV SOLN
Freq: Once | INTRAVENOUS | Status: AC
  Filled 2020-01-13: qty 250

## 2020-01-13 MED ORDER — HEPARIN SOD (PORK) LOCK FLUSH 100 UNIT/ML IV SOLN
500.0000 [IU] | Freq: Once | INTRAVENOUS | Status: AC
  Administered 2020-01-13: 500 [IU] via INTRAVENOUS
  Filled 2020-01-13: qty 5

## 2020-01-13 MED ORDER — SODIUM CHLORIDE 0.9 % IV SOLN
2.0000 mg | Freq: Once | INTRAVENOUS | Status: AC
  Administered 2020-01-13: 2 mg via INTRAVENOUS
  Filled 2020-01-13: qty 4

## 2020-01-13 MED ORDER — HEPARIN SOD (PORK) LOCK FLUSH 100 UNIT/ML IV SOLN
INTRAVENOUS | Status: AC
  Filled 2020-01-13: qty 5

## 2020-01-13 NOTE — Progress Notes (Signed)
[facility] OFFICE PROGRESS NOTE  Patient Care Team: [name], [name], MD as Medical Oncologist (Medical Oncology) Corey Skains, MD as Consulting Physician (Cardiology)  Cancer Staging No matching staging information was found for the patient.   Oncology History Overview Note  # OCT 2015-STAGE IV LEFT BREAST T2N1 [T=4cm; [name]; [name]; [name]; EBUS- Positive Paratrac/subcarinal LN s/p ? Taxotere [in [place]; [name]; [name];[name]; [name]; suv 3]; [name]; [name]; [name];  [name]; Ant R iliac bone; [name]; [name]; START-Taxol weekly. March 2020- Taxol every 2 weeks [PN]; SEP 2020- PET progression  # SEP 04/29/2019- ERIBULIN s/p RT - Right hip- [s/p RT- NOV 2020]  # Poorly controlled Blood sugars- improved.   # Pancreatitis Hx/PEI- on creon in past / CKD IV [creat ~ 3-4; [name]; [name]; mild left sided weakness]  # Jan 2020-  Lobular lesion on tongue- s/p excision pyogenic granuloma [Dr.McQueen]   # GENETIC TESTING/COUNSELLING: HETEROZYGOUS Cystic Fibrosis Gene [explains hx of recurrent pancreatitis]  # MOLECULAR TESTING: NA   # PALLIATIVE CARE: 1/22-Discussed/Declined ------------------------------------------------   DIAGNOSIS: [ 2015] BREAST CA; [name]; [name] ;GOALS: Palliative  CURRENT/MOST RECENT THERAPY: ERIBULIN [C].     Carcinoma of upper-inner quadrant of left  breast in female, estrogen receptor positive ([place])  04/29/2019 -  Chemotherapy   The patient had eriBULin mesylate (HALAVEN) 2 mg in sodium chloride 0.9 % 100 mL chemo infusion, 2 mg, Intravenous,  Once, 10 of 14 cycles Dose modification: 1 mg/m2 (original dose 1 mg/m2, Cycle 1, Reason: Provider Judgment) Administration: 2 mg (06/03/2019), 2 mg (04/29/2019), 2 mg (06/10/2019), 2 mg (07/04/2019), 2 mg (07/11/2019), 2 mg (07/25/2019), 2 mg (08/03/2019), 2 mg (08/19/2019), 2 mg (08/26/2019), 2 mg (09/09/2019), 2 mg (09/16/2019), 2 mg (10/07/2019), 2 mg (10/14/2019), 2 mg (10/28/2019), 2 mg (11/04/2019), 2 mg (11/28/2019), 2 mg (12/09/2019)  for chemotherapy treatment.     INTERVAL HISTORY:  [name] 64 y.o.  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  In the interim patient was evaluated by wound care-noted to have a "blood blister"-that was drained.  Did not have any active infection issues.  Healing well.  She is currently on approximately 5 more days of oral antibiotics.  She was last evaluated by wound care yesterday.  Denies any nausea vomiting fevers or chills.  Chronic swelling in the legs not any worse.  She is interested in having her arthroscopy surgery done.  Review of Systems  Constitutional: Positive for malaise/fatigue. Negative for chills, diaphoresis, [name], hemoptysis, sputum production, [name], palpitations and orthopnea.  Gastrointestinal: Negative for abdominal pain, blood in stool, constipation, diarrhea, heartburn, melena, nausea and vomiting.  Genitourinary: Negative  for dysuria, frequency and urgency.  Musculoskeletal: Positive for back pain and joint pain.  Skin:  Negative.  Negative for itching and rash.  Neurological: Positive for tingling. Negative for dizziness, focal weakness, weakness and headaches.  Endo/Heme/Allergies: Does not bruise/bleed easily.  Psychiatric/Behavioral: Negative for depression. The patient is not nervous/anxious and does not have insomnia.     PAST MEDICAL HISTORY :  Past Medical History:  Diagnosis Date  . Anemia   . Anxiety   . Asthma   . Cancer ([place]) 03/10/2018   Per [facility] PET order. Carcinoma of upper-inner quadrant of left breast in female, estrogen receptor positive .  Marland Kitchen Cancer (HCC)    LUNG  . CHF (congestive heart failure) ([place]) 1997  . CKD (chronic kidney disease)   . Depression   . Diabetes mellitus, type 2 ([place])   . Family history of breast cancer   . Family history of colon cancer   . Family history of ovarian cancer   . Family history of pancreatic cancer   . Family history of prostate cancer   . Family history of stomach cancer   . GERD (gastroesophageal reflux disease)    history of an ulcer  . Hair loss   . History of left breast cancer 05/29/14  . History of partial hysterectomy 12/31/2016   Per patient.  Has not had a period in years.  Had a partial hysterectomy years ago.  Marland Kitchen Hypertension   . Mitral valve regurgitation   . Neuromuscular disorder (HCC)    neuropathies in hand  . Obesity   . Pancreatitis 1997  . Stroke [facility]) 2010   with mild left arm weakness    PAST SURGICAL HISTORY :   Past Surgical History:  Procedure Laterality Date  . CATARACT EXTRACTION W/PHACO Right 02/24/2019   Procedure: CATARACT EXTRACTION PHACO AND INTRAOCULAR LENS PLACEMENT ([place]) RIGHT DIABETES;  Surgeon: Marchia Meiers, MD;  Location: [facility] ORS;  Service: Ophthalmology;  Laterality: Right;  [place] 01:13.0 CDE 7.96 Fluid Pack Lot # [id number]  . CATARACT EXTRACTION W/PHACO Left 03/24/2019   Procedure: CATARACT EXTRACTION PHACO AND INTRAOCULAR LENS PLACEMENT ([facility]) - left diabetic;  Surgeon: Marchia Meiers, MD;   Location: [facility] ORS;  Service: Ophthalmology;  Laterality: Left;  [place]  01:36 CDE 13.93 Fluid pack lot # [id number]  . CESAREAN SECTION    . CHOLECYSTECTOMY    . EXCISION OF TONGUE LESION N/A 08/17/2018   Procedure: EXCISION OF TONGUE LESION WITH FROZEN SECTIONS;  Surgeon: Beverly Gust, MD;  Location: [facility] ORS;  Service: ENT;  Laterality: N/A;  . EYE SURGERY Right    cataract extraction  . PARTIAL HYSTERECTOMY  12/31/2016   Per patient, she has not had a period in years since she had a partial hysterectomy.  Marland Kitchen PORTA CATH INSERTION    . TUBAL LIGATION      FAMILY HISTORY :   Family History  Problem Relation Age of Onset  . Ovarian cancer Mother 52  . Diabetes Mother   . Hypertension Mother   . COPD Father   . Hypertension Father   . Colon cancer Father 70  . Diabetes Sister   . Breast cancer Sister 59       bilateral  . Diabetes Brother   . Leukemia Maternal Aunt   . Pancreatic cancer Paternal Aunt 54  . Pancreatic cancer Paternal Uncle   . Colon cancer Paternal Uncle   . Stomach cancer Maternal Grandfather 33  . Throat cancer Paternal Grandmother   . Breast  cancer Maternal Aunt 80  . Colon cancer Maternal Aunt   . Bone cancer Maternal Aunt   . Breast cancer Paternal Aunt        dx >50  . Prostate cancer Paternal Uncle   . Pancreatic cancer Paternal Uncle   . Throat cancer Paternal Uncle   . Lung cancer Paternal Uncle   . Stomach cancer Paternal Uncle   . Brain cancer Paternal Aunt   . Cancer Cousin        liver, kidney  . Prostate cancer Cousin        meastatic  . Lung cancer Other     SOCIAL HISTORY:   Social History   Tobacco Use  . Smoking status: Former Smoker    Packs/day: 0.50    Years: 1.00    Pack years: 0.50    Types: Cigarettes  . Smokeless tobacco: Never Used  Vaping Use  . Vaping Use: Never used  Substance Use Topics  . Alcohol use: No    Alcohol/week: 0.0 standard drinks  . Drug use: No    ALLERGIES:  is allergic to fish-derived  products and sulfamethoxazole-trimethoprim.  MEDICATIONS:  Current Outpatient Medications  Medication Sig Dispense Refill  . acetaminophen-codeine (TYLENOL #4) 300-60 MG tablet Take 1 tablet by mouth 2 (two) times daily as needed for moderate pain.     Marland Kitchen albuterol (PROAIR HFA) 108 (90 BASE) MCG/ACT inhaler Inhale 2 puffs into the lungs every 6 (six) hours as needed for wheezing or shortness of breath.     Marland Kitchen albuterol (PROVENTIL) (2.5 MG/3ML) 0.083% nebulizer solution Inhale 2.5 mg into the lungs every 6 (six) hours as needed for shortness of breath.     . ALPRAZolam (XANAX) 0.5 MG tablet Take 0.5 mg by mouth 2 (two) times daily as needed for anxiety or sleep.     Marland Kitchen amLODipine (NORVASC) 10 MG tablet Take 10 mg by mouth daily.     Marland Kitchen aspirin EC 81 MG tablet Take 81 mg by mouth daily.     Marland Kitchen atenolol (TENORMIN) 100 MG tablet Take 100 mg by mouth 2 (two) times daily.     . bumetanide (BUMEX) 0.5 MG tablet Take 0.5 mg by mouth 2 (two) times daily.     . calcitRIOL (ROCALTROL) 0.25 MCG capsule Take 0.25 mcg by mouth daily.     . Cinnamon 500 MG capsule Take 500 mg by mouth 2 (two) times daily.     . cloNIDine (CATAPRES) 0.2 MG tablet Take 0.2 mg by mouth 2 (two) times daily.     . diflorasone (PSORCON) 0.05 % ointment Apply 1 application topically as directed.     Marland Kitchen ELDERBERRY PO Take 1 tablet by mouth daily.    . enalapril (VASOTEC) 10 MG tablet Take 20 mg by mouth 2 (two) times a day.     . famotidine (PEPCID) 20 MG tablet Take 20 mg by mouth 2 (two) times daily.     . ferrous sulfate 325 (65 FE) MG tablet Take 325 mg by mouth 2 (two) times daily with a meal.    . FLUoxetine (PROZAC) 20 MG capsule Take 20 mg by mouth 2 (two) times daily.     . Fluticasone Propionate, Inhal, (FLOVENT DISKUS) 100 MCG/BLIST AEPB Inhale 2 puffs into the lungs 2 (two) times daily as needed (respiratory problems).     . gabapentin (NEURONTIN) 100 MG capsule TAKE 1 CAPSULE(100 MG) BY MOUTH AT BEDTIME 30 capsule 6  .  glyBURIDE (DIABETA) 5 MG  tablet Take 10 mg by mouth 2 (two) times daily with a meal.     . LEVEMIR FLEXTOUCH 100 UNIT/ML Pen Inject 55 Units into the skin daily.     Marland Kitchen loperamide (IMODIUM A-D) 2 MG capsule Take 2-4 mg by mouth 4 (four) times daily as needed for diarrhea or loose stools.     Marland Kitchen NOVOLOG FLEXPEN 100 UNIT/ML FlexPen Inject 7 Units into the skin 2 (two) times daily.     Marland Kitchen oxyCODONE-acetaminophen (PERCOCET/ROXICET) 5-325 MG tablet Take 1 tablet by mouth daily as needed for severe pain.    . simvastatin (ZOCOR) 20 MG tablet Take 20 mg by mouth every evening.     . vitamin B-12 (CYANOCOBALAMIN) 1000 MCG tablet Take 1,000 mcg by mouth daily.     No current facility-administered medications for this visit.   Facility-Administered Medications Ordered in Other Visits  Medication Dose Route Frequency Provider Last Rate Last Admin  . 0.9 %  sodium chloride infusion   Intravenous Once Charlaine Dalton R, MD      . eriBULin mesylate (HALAVEN) 2 mg in sodium chloride 0.9 % 100 mL chemo infusion  2 mg Intravenous Once Charlaine Dalton R, MD      . heparin lock flush 100 unit/mL  500 Units Intravenous Once Charlaine Dalton R, MD      . prochlorperazine (COMPAZINE) tablet 10 mg  10 mg Oral Once Charlaine Dalton R, MD      . sodium chloride flush (NS) 0.9 % injection 10 mL  10 mL Intravenous PRN Cammie Sickle, MD   10 mL at 01/30/16 1054    PHYSICAL EXAMINATION: ECOG PERFORMANCE STATUS: 1 - Symptomatic but completely ambulatory  BP 121/78 (BP Location: Right Arm, Patient Position: Sitting)   Pulse 67   Temp (!) 96.5 F (35.8 C) (Tympanic)   Wt 198 lb 14.4 oz (90.2 kg)   SpO2 100%   BMI 35.23 kg/m   Filed Weights   01/13/20 0836  Weight: 198 lb 14.4 oz (90.2 kg)    Physical Exam Constitutional:      Comments: She is alone.  HENT:     Head: Normocephalic and atraumatic.     Mouth/Throat:     Pharynx: No oropharyngeal exudate.  Eyes:     Pupils: Pupils are  equal, round, and reactive to light.  Cardiovascular:     Rate and Rhythm: Normal rate and regular rhythm.  Pulmonary:     Effort: No respiratory distress.     Breath sounds: Normal breath sounds. No wheezing.  Abdominal:     General: Bowel sounds are normal. There is no distension.     Palpations: Abdomen is soft. There is no mass.     Tenderness: There is no abdominal tenderness. There is no guarding or rebound.  Musculoskeletal:        General: No tenderness. Normal range of motion.     Cervical back: Normal range of motion and neck supple.  Skin:    General: Skin is warm.     Comments: Right thumb bandaged-wound healing.  No signs of infection or pus.  Neurological:     Mental Status: She is alert and oriented to person, place, and time.  Psychiatric:        Mood and Affect: Affect normal.     LABORATORY DATA:  I have reviewed the data as listed    Component Value Date/Time   NA 136 01/13/2020 0804   NA 130 (L) 06/06/2014 1102  K 4.3 01/13/2020 0804   K 3.9 06/06/2014 1102   CL 104 01/13/2020 0804   CL 95 (L) 06/06/2014 1102   CO2 22 01/13/2020 0804   CO2 28 06/06/2014 1102   GLUCOSE 198 (H) 01/13/2020 0804   GLUCOSE 349 (H) 06/06/2014 1102   BUN 44 (H) 01/13/2020 0804   BUN 17 06/06/2014 1102   CREATININE 2.77 (H) 01/13/2020 0804   CREATININE 1.63 (H) 06/06/2014 1102   CALCIUM 8.5 (L) 01/13/2020 0804   CALCIUM 9.2 06/06/2014 1102   PROT 7.0 01/13/2020 0804   PROT 8.2 06/06/2014 1102   ALBUMIN 3.4 (L) 01/13/2020 0804   ALBUMIN 3.3 (L) 06/06/2014 1102   AST 15 01/13/2020 0804   AST 7 (L) 06/06/2014 1102   ALT 14 01/13/2020 0804   ALT 12 (L) 06/06/2014 1102   ALKPHOS 57 01/13/2020 0804   ALKPHOS 74 06/06/2014 1102   BILITOT 0.6 01/13/2020 0804   BILITOT 0.4 06/06/2014 1102   GFRNONAA 18 (L) 01/13/2020 0804   GFRNONAA 35 (L) 06/06/2014 1102   GFRAA 20 (L) 01/13/2020 0804   GFRAA 42 (L) 06/06/2014 1102    No results found for: SPEP, UPEP  Lab Results   Component Value Date   WBC 6.4 01/13/2020   NEUTROABS 4.7 01/13/2020   HGB 8.9 (L) 01/13/2020   HCT 26.4 (L) 01/13/2020   MCV 93.3 01/13/2020   PLT 265 01/13/2020      Chemistry      Component Value Date/Time   NA 136 01/13/2020 0804   NA 130 (L) 06/06/2014 1102   K 4.3 01/13/2020 0804   K 3.9 06/06/2014 1102   CL 104 01/13/2020 0804   CL 95 (L) 06/06/2014 1102   CO2 22 01/13/2020 0804   CO2 28 06/06/2014 1102   BUN 44 (H) 01/13/2020 0804   BUN 17 06/06/2014 1102   CREATININE 2.77 (H) 01/13/2020 0804   CREATININE 1.63 (H) 06/06/2014 1102      Component Value Date/Time   CALCIUM 8.5 (L) 01/13/2020 0804   CALCIUM 9.2 06/06/2014 1102   ALKPHOS 57 01/13/2020 0804   ALKPHOS 74 06/06/2014 1102   AST 15 01/13/2020 0804   AST 7 (L) 06/06/2014 1102   ALT 14 01/13/2020 0804   ALT 12 (L) 06/06/2014 1102   BILITOT 0.6 01/13/2020 0804   BILITOT 0.4 06/06/2014 1102       RADIOGRAPHIC STUDIES: I have personally reviewed the radiological images as listed and agreed with the findings in the report. No results found.   ASSESSMENT & PLAN:  Carcinoma of upper-inner quadrant of left breast in female, estrogen receptor positive ([place]) Left breast cancer- stage IV- ER/PR +, HER-2/neu. Currently on eribulin [renally dosed [start at 1 mg/m] started on-04/29/2019.   PET scan MAY 10th, 2021-STABLE; Partial response/ uptake pleural-based lesion/lung lesions/left breast mass.   #Proceed with eribulin today; Labs today reviewed;  acceptable for treatment today.   # Right foot infection/diabetic- [s/p wound clinic evaluation]; on amoxicillin; reviewed the note from wound care service.  No active infection present at this time.  No major contraindication to chemotherapy.  # Bone mets-sclerotic; Right acetabular uptake-s/p radiation. Hypocalcemia-ca-8.3/low vitD- on vit D daily.  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  Patient has been cleared from wound care for her surgery.  Patient will call us to let us know ahead of time; and we can always work around her arthroscopic knee  surgery.  #  Chronic kidney disease - stage IV-GFR 20 stable [[name]  # Anemia- hemoglobin today-9/CKD-STABLE On PO iron.  # DISPOSITION:  #  chemo today # 1 week- labs- cbc/bmp; Eribulin # Follow up in 3 weeks; /MD-labs- cbc/cmp-ca-27-29; Eribulin- Dr.B   Orders Placed This Encounter  Procedures  . CBC with Differential    Standing Status:   Future    Standing Expiration Date:   01/12/2021  . Basic metabolic panel    Standing Status:   Future    Standing Expiration Date:   01/12/2021  . CBC with Differential    Standing Status:   Future    Standing Expiration Date:   01/12/2021  . Comprehensive metabolic panel    Standing Status:   Future    Standing Expiration Date:   01/12/2021  . Cancer antigen 27.29    Standing Status:   Future    Standing Expiration Date:   01/12/2021   All questions were answered. The patient knows to call the clinic with any problems, questions or concerns.      Cammie Sickle, MD 01/13/2020 9:16 AM

## 2020-01-13 NOTE — Assessment & Plan Note (Addendum)
Left breast cancer- stage IV- ER/PR +, HER-2/neu. Currently on eribulin [renally dosed [start at 1 mg/m] started on-04/29/2019.   PET scan MAY 10th, 2021-STABLE; Partial response/ uptake pleural-based lesion/lung lesions/left breast mass.   #Proceed with eribulin today; Labs today reviewed;  acceptable for treatment today.   # Right foot infection/diabetic- [s/p wound clinic evaluation]; on amoxicillin; reviewed the note from wound care service.  No active infection present at this time.  No major contraindication to chemotherapy.  # Bone mets-sclerotic; Right acetabular uptake-s/p radiation. Hypocalcemia-ca-8.3/low vitD- on vit D daily.  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  Patient has been cleared from wound care for her surgery.  Patient will call us to let us know ahead of time; and we can always work around her arthroscopic knee surgery.  #  Chronic kidney disease - stage IV-GFR 20 stable [[name]  # Anemia- hemoglobin today-9/CKD-STABLE On PO iron.  # DISPOSITION:  #  chemo today # 1 week- labs- cbc/bmp; Eribulin # Follow up in 3 weeks; /MD-labs- cbc/cmp-ca-27-29; Eribulin- Dr.B

## 2020-01-13 NOTE — Progress Notes (Signed)
CHARNIKA, HERBST ([id number]) Visit Report for 01/12/2020 Arrival Information Details Patient Name: [name], [name] Date of Service: 01/12/2020 10:15 AM Medical Record Number: [id number] Patient Account Number: [id number] Date of Birth/Sex: 08-13-1955 (64 y.o. F) Treating RN: [name] Primary Care [name]: [name] Other Clinician: Referring [name]: [name] Treating Jonan Seufert/Extender: [name], [name] [name]: 2 Visit Information History Since Last Visit Added or deleted any medications: No Patient Arrived: Wheel Chair Any new allergies or adverse reactions: No Arrival Time: 10:17 Had a fall or experienced change in No Accompanied By: spouse activities of daily living that may affect Transfer Assistance: None risk of falls: Patient Identification Verified: Yes Signs or symptoms of abuse/neglect since last visito No Secondary Verification Process Completed: Yes Hospitalized since last visit: No Patient Has Alerts: Yes Implantable device outside of the clinic excluding No Patient Alerts: DMII cellular tissue based products placed in the center since last visit: Has Dressing in Place as Prescribed: Yes Pain Present Now: No Electronic Signature(s) Signed: 01/13/2020 12:58:54 PM By: [name], BSN, RN, CWS, Kim RN, BSN Entered By: [name], BSN, RN, CWS, [name] on 01/12/2020 10:19:28 [name] ([id number]) -------------------------------------------------------------------------------- Clinic Level of Care Assessment Details Patient Name: Gloria Both A. Date of Service: 01/12/2020 10:15 AM Medical Record Number: [id number] Patient Account Number: [id number] Date of Birth/Sex: January 05, 1956 (64 y.o. F) Treating RN: [name] Primary Care [name]: [name] Other Clinician: Referring [name]: [name] Treating [name]/Extender: [name], [name] [name]: 2 Clinic Level of Care Assessment Items TOOL 4 Quantity Score []  - Use  when only an EandM is performed on FOLLOW-UP visit 0 ASSESSMENTS - Nursing Assessment / Reassessment X - Reassessment of Co-morbidities (includes updates in patient status) 1 10 X- 1 5 Reassessment of Adherence to [name] Plan ASSESSMENTS - Wound and Skin Assessment / Reassessment X - Simple Wound Assessment / Reassessment - one wound 1 5 []  - 0 Complex Wound Assessment / Reassessment - multiple wounds []  - 0 Dermatologic / Skin Assessment (not related to wound area) ASSESSMENTS - Focused Assessment []  - Circumferential Edema Measurements - multi extremities 0 []  - 0 Nutritional Assessment / Counseling / Intervention []  - 0 Lower Extremity Assessment (monofilament, tuning fork, pulses) []  - 0 Peripheral Arterial Disease Assessment (using hand held doppler) ASSESSMENTS - Ostomy and/or Continence Assessment and Care []  - Incontinence Assessment and Management 0 []  - 0 Ostomy Care Assessment and Management (repouching, etc.) PROCESS - Coordination of Care X - Simple Patient / Family Education for ongoing care 1 15 []  - 0 Complex (extensive) Patient / Family Education for ongoing care []  - 0 Staff obtains Programmer, systems, Records, Test Results / Process Orders []  - 0 Staff telephones HHA, Nursing Homes / Clarify orders / etc []  - 0 Routine Transfer to another Facility (non-emergent condition) []  - 0 Routine Hospital Admission (non-emergent condition) []  - 0 New Admissions / Biomedical engineer / Ordering NPWT, Apligraf, etc. []  - 0 Emergency Hospital Admission (emergent condition) X- 1 10 Simple Discharge Coordination []  - 0 Complex (extensive) Discharge Coordination PROCESS - Special Needs []  - Pediatric / Minor Patient Management 0 []  - 0 Isolation Patient Management []  - 0 Hearing / Language / Visual special needs []  - 0 Assessment of Community assistance (transportation, D/C planning, etc.) []  - 0 Additional assistance / Altered mentation []  - 0 Support  Surface(s) Assessment (bed, cushion, seat, etc.) INTERVENTIONS - Wound Cleansing / Measurement [name], [name] ([id number]) X- 1 5 Simple Wound Cleansing - one  wound []  - 0 Complex Wound Cleansing - multiple wounds X- 1 5 Wound Imaging (photographs - any number of wounds) []  - 0 Wound Tracing (instead of photographs) X- 1 5 Simple Wound Measurement - one wound []  - 0 Complex Wound Measurement - multiple wounds INTERVENTIONS - Wound Dressings []  - Small Wound Dressing one or multiple wounds 0 X- 1 15 Medium Wound Dressing one or multiple wounds []  - 0 Large Wound Dressing one or multiple wounds []  - 0 Application of Medications - topical []  - 0 Application of Medications - injection INTERVENTIONS - Miscellaneous []  - External ear exam 0 []  - 0 Specimen Collection (cultures, biopsies, blood, body fluids, etc.) []  - 0 Specimen(s) / Culture(s) sent or taken to Lab for analysis []  - 0 Patient Transfer (multiple staff / Civil Service fast streamer / Similar devices) []  - 0 Simple Staple / Suture removal (25 or less) []  - 0 Complex Staple / Suture removal (26 or more) []  - 0 Hypo / Hyperglycemic Management (close monitor of Blood Glucose) []  - 0 Ankle / Brachial Index (ABI) - do not check if billed separately X- 1 5 Vital Signs Has the patient been seen at the hospital within the last three years: Yes Total Score: 80 Level Of Care: New/Established - Level 3 Electronic Signature(s) Signed: 01/12/2020 11:13:05 AM By: [name] Entered By: [name] on 01/12/2020 10:53:05 [name], [name] ([id number]) -------------------------------------------------------------------------------- Encounter Discharge Information Details Patient Name: Gloria Both A. Date of Service: 01/12/2020 10:15 AM Medical Record Number: [id number] Patient Account Number: [id number] Date of Birth/Sex: 02-19-1956 (64 y.o. F) Treating RN: [name] Primary Care Mazelle Huebert: [name] Other  Clinician: Referring [name]: [name] Treating [name]/Extender: [name], [name] [name]: 2 Encounter Discharge Information Items Discharge Condition: Stable Ambulatory Status: Wheelchair Discharge Destination: Home Transportation: Private Auto Accompanied By: family Schedule Follow-up Appointment: Yes Clinical Summary of Care: Electronic Signature(s) Signed: 01/12/2020 11:13:05 AM By: [name] Entered By: [name] on 01/12/2020 10:53:51 Linker, [name] ([id number]) -------------------------------------------------------------------------------- Lower Extremity Assessment Details Patient Name: Gloria Both A. Date of Service: 01/12/2020 10:15 AM Medical Record Number: [id number] Patient Account Number: [id number] Date of Birth/Sex: [birth year] (64 y.o. F) Treating RN: [name] Primary Care Yumi Insalaco: [name] Other Clinician: Referring Oriyah Lamphear: [name] Treating Shakthi Scipio/Extender: [name], [name] [name]: 2 Edema Assessment Assessed: [Left: No] [Right: No] Edema: [Left: Ye] [Right: s] Vascular Assessment Pulses: Dorsalis Pedis Palpable: [Right:Yes] Electronic Signature(s) Signed: 01/13/2020 12:58:54 PM By: [name], BSN, RN, CWS, Kim RN, BSN Entered By: [name], BSN, RN, CWS, [name] on 01/12/2020 10:26:00 [name] ([id number]) -------------------------------------------------------------------------------- Multi Wound Chart Details Patient Name: Gloria Both A. Date of Service: 01/12/2020 10:15 AM Medical Record Number: [id number] Patient Account Number: [id number] Date of Birth/Sex: Dec 02, 1955 (64 y.o. F) Treating RN: [name] Primary Care Doree Kuehne: [name] Other Clinician: Referring Zonia Caplin: [name] Treating Floraine Buechler/Extender: [name], [name] [name]: 2 Vital Signs Height(in): 62 Pulse(bpm): 80 Weight(lbs): 195 Blood Pressure(mmHg): 138/77 Body Mass Index(BMI):  36 Temperature(F): 97.8 Respiratory Rate(breaths/min): 16 Photos: [N/A:N/A] Wound Location: Right, Medial Foot N/A N/A Wounding Event: Blister N/A N/A Primary Etiology: Diabetic Wound/Ulcer of the Lower N/A N/A Extremity Comorbid History: Cataracts, Asthma, Congestive N/A N/A Heart Failure, Hypertension, Type II Diabetes, Received Chemotherapy, Received Radiation Date Acquired: 12/20/2019 N/A N/A Gloria of [name]: 2 N/A N/A Wound Status: Open N/A N/A Measurements L x W x D (cm) 1.4x2.1x0.1 N/A N/A Area (cm) : 2.309 N/A N/A Volume (cm) : 0.231 N/A  N/A % Reduction in Area: 86.90% N/A N/A % Reduction in Volume: 86.90% N/A N/A Classification: Grade 2 N/A N/A Exudate Amount: Medium N/A N/A Exudate Type: Serosanguineous N/A N/A Exudate Color: red, brown N/A N/A Wound Margin: Flat and Intact N/A N/A Granulation Amount: Large (67-100%) N/A N/A Necrotic Amount: Small (1-33%) N/A N/A Exposed Structures: Fat Layer (Subcutaneous Tissue) N/A N/A Exposed: Yes Fascia: No Tendon: No Muscle: No Joint: No Bone: No Epithelialization: Small (1-33%) N/A N/A [name] Notes Electronic Signature(s) Signed: 01/12/2020 11:13:05 AM By: [name] Entered By: [name] on 01/12/2020 10:49:40 [name], [name] ([id number]) -------------------------------------------------------------------------------- Multi-Disciplinary Care Plan Details Patient Name: Gloria Both A. Date of Service: 01/12/2020 10:15 AM Medical Record Number: [id number] Patient Account Number: [id number] Date of Birth/Sex: [birth year] (64 y.o. F) Treating RN: [name] Primary Care Aston Lawhorn: [name] Other Clinician: Referring Aaira Oestreicher: [name] Treating Avanti Jetter/Extender: [name], [name] [name]: 2 Active Inactive Abuse / Safety / Falls / Self Care Management Nursing Diagnoses: Potential for falls Goals: Patient will not experience any injury related to falls Date Initiated:  12/27/2019 Target Resolution Date: 03/10/2020 Goal Status: Active Interventions: Assess fall risk on admission and as needed Notes: Nutrition Nursing Diagnoses: Impaired glucose control: actual or potential Goals: Patient/caregiver agrees to and verbalizes understanding of need to use nutritional supplements and/or vitamins as prescribed Date Initiated: 12/27/2019 Target Resolution Date: 03/10/2020 Goal Status: Active Interventions: Assess patient nutrition upon admission and as needed per policy Notes: Orientation to the Wound Care Program Nursing Diagnoses: Knowledge deficit related to the wound healing center program Goals: Patient/caregiver will verbalize understanding of the [facility] Date Initiated: 12/27/2019 Target Resolution Date: 03/10/2020 Goal Status: Active Interventions: Provide education on orientation to the wound center Notes: Wound/Skin Impairment Nursing Diagnoses: Impaired tissue integrity Goals: Ulcer/skin breakdown will heal within 14 Gloria Date Initiated: 12/27/2019 Target Resolution Date: 03/10/2020 Goal Status: Active [name], [name] ([id number]) Interventions: Assess patient/caregiver ability to obtain necessary supplies Assess patient/caregiver ability to perform ulcer/skin care regimen upon admission and as needed Assess ulceration(s) every visit Notes: Electronic Signature(s) Signed: 01/12/2020 11:13:05 AM By: [name] Entered By: [name] on 01/12/2020 10:49:33 Echeverria, [name] Kitchen ([id number]) -------------------------------------------------------------------------------- Pain Assessment Details Patient Name: Gloria Both A. Date of Service: 01/12/2020 10:15 AM Medical Record Number: [id number] Patient Account Number: [id number] Date of Birth/Sex: 1956/05/02 (64 y.o. F) Treating RN: [name] Primary Care Yossi Hinchman: [name] Other Clinician: Referring Tifini Reeder: [name] Treating Rhys Anchondo/Extender: [name], [name] [name]: 2 Active Problems Location of Pain Severity and Description of Pain Patient Has Paino No Site Locations Pain Management and Medication Current Pain Management: Electronic Signature(s) Signed: 01/13/2020 12:58:54 PM By: [name], BSN, RN, CWS, Kim RN, BSN Entered By: [name], BSN, RN, CWS, [name] on 01/12/2020 10:21:07 [name] ([id number]) -------------------------------------------------------------------------------- Patient/Caregiver Education Details Patient Name: Gloria Both A. Date of Service: 01/12/2020 10:15 AM Medical Record Number: [id number] Patient Account Number: [id number] Date of Birth/Gender: 03-01-1956 (64 y.o. F) Treating RN: [name] Primary Care Physician: [name] Other Clinician: Referring Physician: Tracie [name] Treating Physician/Extender: Sharalyn Ink in [name]: 2 Education Assessment Education Provided To: Patient Education Topics Provided Wound/Skin Impairment: Handouts: Caring for Your Ulcer Methods: Demonstration, Explain/Verbal Responses: State content correctly Electronic Signature(s) Signed: 01/12/2020 11:13:05 AM By: [name] Entered By: [name] on 01/12/2020 10:53:20 [name], [name] Kitchen ([id number]) -------------------------------------------------------------------------------- Wound Assessment Details Patient Name: Gloria Both A. Date of Service: 01/12/2020 10:15 AM Medical Record Number: [id number] Patient Account Number:  [id number] Date of Birth/Sex: Oct 09, 1955 (64 y.o. F) Treating RN: [name] Primary Care Simrah Chatham: [name] Other Clinician: Referring Sal Spratley: [name] Treating Maejor Erven/Extender: [name], [name] [name]: 2 Wound Status Wound Number: 1 Primary Diabetic Wound/Ulcer of the Lower Extremity Etiology: Wound Location: Right, Medial Foot Wound Open Wounding Event: Blister Status: Date Acquired: 12/20/2019 Comorbid Cataracts,  Asthma, Congestive Heart Failure, Gloria Of [name]: 2 History: Hypertension, Type II Diabetes, Received Chemotherapy, Clustered Wound: No Received Radiation Photos Wound Measurements Length: (cm) 1.4 % Width: (cm) 2.1 % Depth: (cm) 0.1 E Area: (cm) 2.309 Volume: (cm) 0.231 Reduction in Area: 86.9% Reduction in Volume: 86.9% pithelialization: Small (1-33%) Tunneling: No Undermining: No Wound Description Classification: Grade 2 F Wound Margin: Flat and Intact S Exudate Amount: Medium Exudate Type: Serosanguineous Exudate Color: red, brown oul Odor After Cleansing: No lough/Fibrino Yes Wound Bed Granulation Amount: Large (67-100%) Exposed Structure Necrotic Amount: Small (1-33%) Fascia Exposed: No Necrotic Quality: Adherent Slough Fat Layer (Subcutaneous Tissue) Exposed: Yes Tendon Exposed: No Muscle Exposed: No Joint Exposed: No Bone Exposed: No [name] Notes Wound #1 (Right, Medial Foot) Notes scell, abd, kerlix and tubigrip g, peg assist Electronic Signature(s) [name], [name] ([id number]) Signed: 01/13/2020 12:58:54 PM By: [name], BSN, RN, CWS, Kim RN, BSN Entered By: [name], BSN, RN, CWS, [name] on 01/12/2020 10:27:25 [name] ([id number]) -------------------------------------------------------------------------------- Vitals Details Patient Name: Gloria Both A. Date of Service: 01/12/2020 10:15 AM Medical Record Number: [id number] Patient Account Number: [id number] Date of Birth/Sex: 01-31-1956 (64 y.o. F) Treating RN: [name] Primary Care Jerauld Bostwick: [name] Other Clinician: Referring Zadkiel Dragan: [name] Treating Jazminn Pomales/Extender: [name], [name] [name]: 2 Vital Signs Time Taken: 10:19 Temperature (F): 97.8 Height (in): 62 Pulse (bpm): 74 Weight (lbs): 195 Respiratory Rate (breaths/min): 16 Body Mass Index (BMI): 35.7 Blood Pressure (mmHg): 138/77 Reference Range: 80 - 120 mg / dl Electronic  Signature(s) Signed: 01/13/2020 12:58:54 PM By: [name], BSN, RN, CWS, Kim RN, BSN Entered By: [name], BSN, RN, CWS, [name] on 01/12/2020 10:20:03

## 2020-01-14 LAB — CANCER ANTIGEN 27.29: CA 27.29: 122.3 U/mL — ABNORMAL HIGH (ref 0.0–38.6)

## 2020-01-18 DIAGNOSIS — C799 Secondary malignant neoplasm of unspecified site: Secondary | ICD-10-CM | POA: Diagnosis not present

## 2020-01-18 DIAGNOSIS — I132 Hypertensive heart and chronic kidney disease with heart failure and with stage 5 chronic kidney disease, or end stage renal disease: Secondary | ICD-10-CM | POA: Diagnosis not present

## 2020-01-18 DIAGNOSIS — Z0000 Encounter for general adult medical examination without abnormal findings: Secondary | ICD-10-CM | POA: Diagnosis not present

## 2020-01-18 DIAGNOSIS — F329 Major depressive disorder, single episode, unspecified: Secondary | ICD-10-CM | POA: Diagnosis not present

## 2020-01-18 DIAGNOSIS — Z794 Long term (current) use of insulin: Secondary | ICD-10-CM | POA: Diagnosis not present

## 2020-01-18 DIAGNOSIS — G8929 Other chronic pain: Secondary | ICD-10-CM | POA: Diagnosis not present

## 2020-01-18 DIAGNOSIS — M25561 Pain in right knee: Secondary | ICD-10-CM | POA: Diagnosis not present

## 2020-01-18 DIAGNOSIS — I509 Heart failure, unspecified: Secondary | ICD-10-CM | POA: Diagnosis not present

## 2020-01-18 DIAGNOSIS — D649 Anemia, unspecified: Secondary | ICD-10-CM | POA: Diagnosis not present

## 2020-01-18 DIAGNOSIS — E1122 Type 2 diabetes mellitus with diabetic chronic kidney disease: Secondary | ICD-10-CM | POA: Diagnosis not present

## 2020-01-18 DIAGNOSIS — N185 Chronic kidney disease, stage 5: Secondary | ICD-10-CM | POA: Diagnosis not present

## 2020-01-18 DIAGNOSIS — C50912 Malignant neoplasm of unspecified site of left female breast: Secondary | ICD-10-CM | POA: Diagnosis not present

## 2020-01-19 ENCOUNTER — Encounter: Payer: Medicare HMO | Admitting: Physician Assistant

## 2020-01-19 ENCOUNTER — Other Ambulatory Visit: Payer: Self-pay

## 2020-01-19 DIAGNOSIS — Z9221 Personal history of antineoplastic chemotherapy: Secondary | ICD-10-CM | POA: Diagnosis not present

## 2020-01-19 DIAGNOSIS — E11621 Type 2 diabetes mellitus with foot ulcer: Secondary | ICD-10-CM | POA: Diagnosis not present

## 2020-01-19 DIAGNOSIS — L97512 Non-pressure chronic ulcer of other part of right foot with fat layer exposed: Secondary | ICD-10-CM | POA: Diagnosis not present

## 2020-01-19 DIAGNOSIS — L97511 Non-pressure chronic ulcer of other part of right foot limited to breakdown of skin: Secondary | ICD-10-CM | POA: Diagnosis not present

## 2020-01-19 DIAGNOSIS — I5042 Chronic combined systolic (congestive) and diastolic (congestive) heart failure: Secondary | ICD-10-CM | POA: Diagnosis not present

## 2020-01-19 DIAGNOSIS — I110 Hypertensive heart disease with heart failure: Secondary | ICD-10-CM | POA: Diagnosis not present

## 2020-01-19 DIAGNOSIS — Z923 Personal history of irradiation: Secondary | ICD-10-CM | POA: Diagnosis not present

## 2020-01-19 NOTE — Progress Notes (Signed)
[name], [name] Visit Report for 01/19/2020 Arrival Information Details Patient Name: [name], [name] Date of Service: 01/19/2020 12:45 PM Medical Record Number: [id number] Patient Account Number: [id number] Date of Birth/Sex: 1956/01/08 (64 y.o. F) Treating RN: Army Melia Primary Care Maisha Bogen: [name] Other Clinician: Referring Samanthamarie Ezzell: [name] Treating Marianela Mandrell/Extender: Melburn Hake, HOYT Weeks in Treatment: 3 Visit Information History Since Last Visit Added or deleted any medications: No Patient Arrived: Wheel Chair Any new allergies or adverse reactions: No Arrival Time: 12:49 Signs or symptoms of abuse/neglect since last visito No Accompanied By: Shearon Stalls Hospitalized since last visit: No Transfer Assistance: None Implantable device outside of the clinic excluding No Patient Identification Verified: Yes cellular tissue based products placed in the center Secondary Verification Process Completed: Yes since last visit: Patient Has Alerts: Yes Has Dressing in Place as Prescribed: Yes Patient Alerts: DMII Pain Present Now: No Electronic Signature(s) Signed: 01/19/2020 3:30:19 PM By: Lorine Bears RCP, RRT, CHT Entered By: Lorine Bears on 01/19/2020 12:52:28 Posten, [name] -------------------------------------------------------------------------------- Encounter Discharge Information Details Patient Name: Gloria Both A. Date of Service: 01/19/2020 12:45 PM Medical Record Number: [id number] Patient Account Number: [id number] Date of Birth/Sex: [birth year] (64 y.o. F) Treating RN: Army Melia Primary Care Thos Matsumoto: [name] Other Clinician: Referring Brissia Delisa: [name] Treating Jarrius Huaracha/Extender: Melburn Hake, HOYT Weeks in Treatment: 3 Encounter Discharge Information Items Post Procedure Vitals Discharge Condition: Stable Temperature (F): 97.8 Ambulatory Status:  Ambulatory Pulse (bpm): 70 Discharge Destination: Home Respiratory Rate (breaths/min): 16 Transportation: Private Auto Blood Pressure (mmHg): 149/82 Accompanied By: spouse Schedule Follow-up Appointment: Yes Clinical Summary of Care: Electronic Signature(s) Signed: 01/19/2020 1:58:43 PM By: Army Melia Entered By: Army Melia on 01/19/2020 13:30:06 Mittleman, [name] ([id number]) -------------------------------------------------------------------------------- Lower Extremity Assessment Details Patient Name: Gloria Both A. Date of Service: 01/19/2020 12:45 PM Medical Record Number: [id number] Patient Account Number: [id number] Date of Birth/Sex: 1955-12-05 (64 y.o. F) Treating RN: Montey Hora Primary Care Mia Milan: [name] Other Clinician: Referring Glada Wickstrom: [name] Treating Isaid Salvia/Extender: STONE III, HOYT Weeks in Treatment: 3 Edema Assessment Assessed: [Left: No] [Right: No] Edema: [Left: Ye] [Right: s] Vascular Assessment Pulses: Dorsalis Pedis Palpable: [Right:Yes] Electronic Signature(s) Signed: 01/19/2020 4:14:31 PM By: Montey Hora Entered By: Montey Hora on 01/19/2020 13:01:14 Swango, [name] ([id number]) -------------------------------------------------------------------------------- Multi Wound Chart Details Patient Name: Gloria Both A. Date of Service: 01/19/2020 12:45 PM Medical Record Number: [id number] Patient Account Number: [id number] Date of Birth/Sex: 09-08-1955 (64 y.o. F) Treating RN: Army Melia Primary Care Pragya Lofaso: [name] Other Clinician: Referring Sylina Henion: [name] Treating Jimmie Dattilio/Extender: Melburn Hake, HOYT Weeks in Treatment: 3 Vital Signs Height(in): 62 Pulse(bpm): 70 Weight(lbs): 195 Blood Pressure(mmHg): 149/82 Body Mass Index(BMI): 36 Temperature(F): 97.8 Respiratory Rate(breaths/min): 16 Photos: [N/A:N/A] Wound Location: Right, Medial Foot N/A N/A Wounding Event: Blister N/A  N/A Primary Etiology: Diabetic Wound/Ulcer of the Lower N/A N/A Extremity Comorbid History: Cataracts, Asthma, Congestive N/A N/A Heart Failure, Hypertension, Type II Diabetes, Received Chemotherapy, Received Radiation Date Acquired: 12/20/2019 N/A N/A Weeks of Treatment: 3 N/A N/A Wound Status: Open N/A N/A Measurements L x W x D (cm) 0.5x1.4x0.1 N/A N/A Area (cm) : 0.55 N/A N/A Volume (cm) : 0.055 N/A N/A % Reduction in Area: 96.90% N/A N/A % Reduction in Volume: 96.90% N/A N/A Classification: Grade 2 N/A N/A Exudate Amount: Medium N/A N/A Exudate Type: Serosanguineous N/A N/A Exudate Color: red, brown N/A N/A Wound Margin: Flat and Intact N/A N/A Granulation Amount: Large (67-100%) N/A N/A Necrotic Amount: Small (1-33%)  N/A N/A Exposed Structures: Fat Layer (Subcutaneous Tissue) N/A N/A Exposed: Yes Fascia: No Tendon: No Muscle: No Joint: No Bone: No Epithelialization: Small (1-33%) N/A N/A Treatment Notes Electronic Signature(s) Signed: 01/19/2020 1:58:43 PM By: Army Melia Entered By: Army Melia on 01/19/2020 13:21:58 Ent, [name] ([id number]) -------------------------------------------------------------------------------- [place] Details Patient Name: Gloria Both A. Date of Service: 01/19/2020 12:45 PM Medical Record Number: [id number] Patient Account Number: [id number] Date of Birth/Sex: 1956/03/31 (64 y.o. F) Treating RN: Army Melia Primary Care Zakya Halabi: [name] Other Clinician: Referring Feliciana Narayan: [name] Treating Oskar Cretella/Extender: Melburn Hake, HOYT Weeks in Treatment: 3 Active Inactive Abuse / Safety / Falls / Self Care Management Nursing Diagnoses: Potential for falls Goals: Patient will not experience any injury related to falls Date Initiated: 12/27/2019 Target Resolution Date: 03/10/2020 Goal Status: Active Interventions: Assess fall risk on admission and as needed Notes: Nutrition Nursing  Diagnoses: Impaired glucose control: actual or potential Goals: Patient/caregiver agrees to and verbalizes understanding of need to use nutritional supplements and/or vitamins as prescribed Date Initiated: 12/27/2019 Target Resolution Date: 03/10/2020 Goal Status: Active Interventions: Assess patient nutrition upon admission and as needed per policy Notes: Orientation to the Wound Care Program Nursing Diagnoses: Knowledge deficit related to the wound healing center program Goals: Patient/caregiver will verbalize understanding of the [place] Date Initiated: 12/27/2019 Target Resolution Date: 03/10/2020 Goal Status: Active Interventions: Provide education on orientation to the wound center Notes: Wound/Skin Impairment Nursing Diagnoses: Impaired tissue integrity Goals: Ulcer/skin breakdown will heal within 14 weeks Date Initiated: 12/27/2019 Target Resolution Date: 03/10/2020 Goal Status: Active MARLIN, BRYS A. ([id number]) Interventions: Assess patient/caregiver ability to obtain necessary supplies Assess patient/caregiver ability to perform ulcer/skin care regimen upon admission and as needed Assess ulceration(s) every visit Notes: Electronic Signature(s) Signed: 01/19/2020 1:58:43 PM By: Army Melia Entered By: Army Melia on 01/19/2020 13:21:39 Housand, [name] ([id number]) -------------------------------------------------------------------------------- Pain Assessment Details Patient Name: Gloria Both A. Date of Service: 01/19/2020 12:45 PM Medical Record Number: [id number] Patient Account Number: [id number] Date of Birth/Sex: Dec 24, 1955 (64 y.o. F) Treating RN: Montey Hora Primary Care Carlyon Nolasco: [name] Other Clinician: Referring Chavela Justiniano: [name] Treating Faith Patricelli/Extender: Melburn Hake, HOYT Weeks in Treatment: 3 Active Problems Location of Pain Severity and Description of Pain Patient Has Paino No Site Locations Pain  Management and Medication Current Pain Management: Electronic Signature(s) Signed: 01/19/2020 4:14:31 PM By: Montey Hora Entered By: Montey Hora on 01/19/2020 12:58:38 [name], [name] ([id number]) -------------------------------------------------------------------------------- Patient/Caregiver Education Details Patient Name: Gloria Both A. Date of Service: 01/19/2020 12:45 PM Medical Record Number: [id number] Patient Account Number: [id number] Date of Birth/Gender: Oct 22, 1955 (64 y.o. F) Treating RN: Army Melia Primary Care Physician: [name] Other Clinician: Referring Physician: [name] Treating Physician/Extender: Sharalyn Ink in Treatment: 3 Education Assessment Education Provided To: Patient Education Topics Provided Wound/Skin Impairment: Handouts: Caring for Your Ulcer Methods: Demonstration, Explain/Verbal Responses: State content correctly Electronic Signature(s) Signed: 01/19/2020 1:58:43 PM By: Army Melia Entered By: Army Melia on 01/19/2020 13:29:26 Forrey, [name] Kitchen ([id number]) -------------------------------------------------------------------------------- Wound Assessment Details Patient Name: Gloria Both A. Date of Service: 01/19/2020 12:45 PM Medical Record Number: [id number] Patient Account Number: [id number] Date of Birth/Sex: 1955-12-29 (64 y.o. F) Treating RN: Montey Hora Primary Care Alieah Brinton: [name] Other Clinician: Referring Dawnell Bryant: [name] Treating Laddie Naeem/Extender: STONE III, HOYT Weeks in Treatment: 3 Wound Status Wound Number: 1 Primary Diabetic Wound/Ulcer of the Lower Extremity Etiology: Wound Location: Right, Medial Foot Wound Open Wounding Event: Blister Status: Date Acquired: 12/20/2019 Comorbid Cataracts,  Asthma, Congestive Heart Failure, Weeks Of Treatment: 3 History: Hypertension, Type II Diabetes, Received Chemotherapy, Clustered Wound: No Received  Radiation Photos Wound Measurements Length: (cm) 0.5 % R Width: (cm) 1.4 % R Depth: (cm) 0.1 Epi Area: (cm) 0.55 Tu Volume: (cm) 0.055 Un eduction in Area: 96.9% eduction in Volume: 96.9% thelialization: Small (1-33%) nneling: No dermining: No Wound Description Classification: Grade 2 Fo Wound Margin: Flat and Intact Sl Exudate Amount: Medium Exudate Type: Serosanguineous Exudate Color: red, brown ul Odor After Cleansing: No ough/Fibrino Yes Wound Bed Granulation Amount: Large (67-100%) Exposed Structure Necrotic Amount: Small (1-33%) Fascia Exposed: No Necrotic Quality: Adherent Slough Fat Layer (Subcutaneous Tissue) Exposed: Yes Tendon Exposed: No Muscle Exposed: No Joint Exposed: No Bone Exposed: No Treatment Notes Wound #1 (Right, Medial Foot) Notes scell, abd, kerlix and tubigrip g, Electronic Signature(s) [name], [name] ([id number]) Signed: 01/19/2020 4:14:31 PM By: Montey Hora Entered By: Montey Hora on 01/19/2020 13:01:02 Moyers, [name] ([id number]) -------------------------------------------------------------------------------- Vitals Details Patient Name: Gloria Both A. Date of Service: 01/19/2020 12:45 PM Medical Record Number: [id number] Patient Account Number: [id number] Date of Birth/Sex: 1956-06-24 (64 y.o. F) Treating RN: Army Melia Primary Care Saundra Gin: [name] Other Clinician: Referring Vergia Chea: [name] Treating Patriciann Becht/Extender: Melburn Hake, HOYT Weeks in Treatment: 3 Vital Signs Time Taken: 12:50 Temperature (F): 97.8 Height (in): 62 Pulse (bpm): 70 Weight (lbs): 195 Respiratory Rate (breaths/min): 16 Body Mass Index (BMI): 35.7 Blood Pressure (mmHg): 149/82 Reference Range: 80 - 120 mg / dl Electronic Signature(s) Signed: 01/19/2020 3:30:19 PM By: Lorine Bears RCP, RRT, CHT Entered By: Lorine Bears on 01/19/2020 12:55:47

## 2020-01-19 NOTE — Progress Notes (Signed)
[name], [name] ([id number]) Visit Report for 01/19/2020 Chief Complaint Document Details Patient Name: [name], [name] Date of Service: 01/19/2020 12:45 PM Medical Record Number: [id number] Patient Account Number: [id number] Date of Birth/Sex: 03/09/1956 (64 y.o. F) Treating RN: Army Melia Primary Care Provider: [name] Other Clinician: Referring Provider: [name] Treating Provider/Extender: Melburn Hake, Gene Glazebrook Weeks in Treatment: 3 Information Obtained from: Patient Chief Complaint Right foot ulcer Electronic Signature(s) Signed: 01/19/2020 2:20:40 PM By: Worthy Keeler PA-C Entered By: Worthy Keeler on 01/19/2020 14:20:39 Mattern, [name] ([id number]) -------------------------------------------------------------------------------- Debridement Details Patient Name: Gloria Both A. Date of Service: 01/19/2020 12:45 PM Medical Record Number: [id number] Patient Account Number: [id number] Date of Birth/Sex: 08-May-1956 (64 y.o. F) Treating RN: Army Melia Primary Care Provider: [name] Other Clinician: Referring Provider: [name] Treating Provider/Extender: Melburn Hake, Kristena Wilhelmi Weeks in Treatment: 3 Debridement Performed for Wound #1 Right,Medial Foot Assessment: Performed By: Physician STONE III, Corrado Hymon E., PA-C Debridement Type: Debridement Severity of Tissue Pre Debridement: Fat layer exposed Level of Consciousness (Pre- Awake and Alert procedure): Pre-procedure Verification/Time Out Yes - 13:21 Taken: Start Time: 13:22 Pain Control: Lidocaine Total Area Debrided (L x W): 0.5 (cm) x 1.4 (cm) = 0.7 (cm) Tissue and other material Viable, Non-Viable, Callus, Subcutaneous debrided: Level: Skin/Subcutaneous Tissue Debridement Description: Excisional Instrument: Curette, Forceps, Scissors Bleeding: None End Time: 13:24 Response to Treatment: Procedure was tolerated well Level of Consciousness (Post- Awake and Alert procedure): Post  Debridement Measurements of Total Wound Length: (cm) 0.5 Width: (cm) 1.4 Depth: (cm) 0.1 Volume: (cm) 0.055 Character of Wound/Ulcer Post Debridement: Stable Severity of Tissue Post Debridement: Fat layer exposed Post Procedure Diagnosis Same as Pre-procedure Electronic Signature(s) Signed: 01/19/2020 1:58:43 PM By: Army Melia Signed: 01/19/2020 4:21:54 PM By: Worthy Keeler PA-C Entered By: Army Melia on 01/19/2020 13:23:40 [name], [name] ([id number]) -------------------------------------------------------------------------------- HPI Details Patient Name: Gloria Both A. Date of Service: 01/19/2020 12:45 PM Medical Record Number: [id number] Patient Account Number: [id number] Date of Birth/Sex: [birth year] (64 y.o. F) Treating RN: Army Melia Primary Care Provider: [name] Other Clinician: Referring Provider: [name] Treating Provider/Extender: Melburn Hake, Welborn Keena Weeks in Treatment: 3 History of Present Illness HPI Description: 12/27/2019 upon evaluation today patient appears to be doing somewhat poorly upon initial inspection here in the clinic. She has unfortunately been having issues with for the past week a blister over her right heel that started on the plantar aspect and has spread to the medial aspect. She does have a history of diabetes mellitus type 2 she also has hypertension along with congestive heart failure. With that being said she was supposed to be having knee surgery on her right knee but this was postponed by the surgeon and she was referred to [place] due to the blister noted. Fortunately there is no signs of active infection at this time. With that being said I am concerned about the fact that this could develop into infection. Obviously we want to prevent such from happening. She does note that she been placed on a antibiotic for urinary tract infection by her primary care provider she has that to pick up. I asked her to call and let us know what  that antibiotic was so that we can put that in the chart. For that reason I am not can give her anything empirically at this point. The patient cannot remember any injury that she had to this region in fact she really does not know how this began at all other than the  fact that it "just showed up". 01/05/2020 upon evaluation today patient appears to be doing about the same in regard to her heel ulcer. She has been tolerating the dressing changes without complication. Fortunately there is no signs of active infection at this time. No fevers, chills, nausea, vomiting, or diarrhea. With that being said I do believe the skin is getting need to be removed from the heel where this is somewhat deflated there is still a lot of blood collecting underneath and I think this is not can I do her any good to be perfectly honest. Plus we do not really know exactly what everything looks like underneath as far as the actual wound is concerned. Obviously we need to figure that out. 01/12/2020 upon evaluation today patient's wound actually appears to be doing excellent in fact this is very close to complete resolution. Subsequently I also did send a culture after removing the blood blister last week and the patient did not have any bacteria noted just normal skin flora and no growth otherwise after 2 days. Fortunately there is no signs of active infection at this time systemically or locally. Overall I feel that she is doing excellent. 01/19/2020 upon evaluation today patient appears to be doing excellent at this time in regard to her heel ulcer for the most part. The one issue that we do see is that she is having a new blistered area that amount to clear away some of the skin from today. This seems to be due to her foot slipping in the offloading shoe that she currently is utilizing. Fortunately there is no signs of active infection at this time. No fevers, chills, nausea, vomiting, or diarrhea. Electronic  Signature(s) Signed: 01/19/2020 2:20:51 PM By: Worthy Keeler PA-C Entered By: Worthy Keeler on 01/19/2020 14:20:50 [name], [name] ([id number]) -------------------------------------------------------------------------------- Physical Exam Details Patient Name: Gloria Both A. Date of Service: 01/19/2020 12:45 PM Medical Record Number: [id number] Patient Account Number: [id number] Date of Birth/Sex: January 07, 1956 (64 y.o. F) Treating RN: Army Melia Primary Care Provider: [name] Other Clinician: Referring Provider: [name] Treating Provider/Extender: STONE III, Rick Warnick Weeks in Treatment: 3 Constitutional Well-nourished and well-hydrated in no acute distress. Respiratory normal breathing without difficulty. Psychiatric this patient is able to make decisions and demonstrates good insight into disease process. Alert and Oriented x 3. pleasant and cooperative. Notes Patient's wound bed currently showed signs of good granulation there does not appear to be any evidence of active infection overall very pleased with where things stand. I was able to clear away some of the slough and skin that was over the surface of the wound today which the patient tolerated without complication once debridement was complete she has just a very superficial area of opening that she is able to stay off of it for the next week I think this will make a big difference for her as far as healing is concerned. Electronic Signature(s) Signed: 01/19/2020 2:21:32 PM By: Worthy Keeler PA-C Entered By: Worthy Keeler on 01/19/2020 14:21:32 [name], [name] ([id number]) -------------------------------------------------------------------------------- Physician Orders Details Patient Name: Gloria Both A. Date of Service: 01/19/2020 12:45 PM Medical Record Number: [id number] Patient Account Number: [id number] Date of Birth/Sex: [birth year] (64 y.o. F) Treating RN: Army Melia Primary Care Provider:  [name] Other Clinician: Referring Provider: [name] Treating Provider/Extender: Melburn Hake, Jayanna Kroeger Weeks in Treatment: 3 Verbal / Phone Orders: No Diagnosis Coding Wound Cleansing Wound #1 Right,Medial Foot o May Shower, gently pat wound dry prior to  applying new dressing. Primary Wound Dressing Wound #1 Right,Medial Foot o Silver Alginate Secondary Dressing Wound #1 Right,Medial Foot o ABD and Kerlix/Conform Dressing Change Frequency Wound #1 Right,Medial Foot o Change dressing every other day. Follow-up Appointments Wound #1 Right,Medial Foot o Return Appointment in 1 week. Edema Control o Elevate legs to the level of the heart and pump ankles as often as possible o Other: - TubiGrip G Off-Loading Wound #1 Right,Medial Foot o Open toe surgical shoe with peg assist. Electronic Signature(s) Signed: 01/19/2020 1:58:43 PM By: Army Melia Signed: 01/19/2020 4:21:54 PM By: Worthy Keeler PA-C Entered By: Army Melia on 01/19/2020 13:29:11 [name], [name] Kitchen ([id number]) -------------------------------------------------------------------------------- Problem List Details Patient Name: Gloria Both A. Date of Service: 01/19/2020 12:45 PM Medical Record Number: [id number] Patient Account Number: [id number] Date of Birth/Sex: [birth year] (64 y.o. 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 Date of Service: 01/19/2020 12:45 PM Medical Record Number: [id number] Patient Account Number: [id number] Date of Birth/Sex: 30-Sep-1955 (64 y.o. F) Treating RN: Army Melia Primary Care Provider: [name] Other Clinician: Referring Provider: [name] Treating Provider/Extender: Melburn Hake, Obera Stauch Weeks in Treatment: 3 Subjective Chief Complaint Information obtained from Patient Right foot ulcer History of Present Illness (HPI) 12/27/2019 upon evaluation today patient appears to be doing somewhat poorly upon initial inspection here in the clinic. She has unfortunately been having issues with for the past week a blister over her right heel that started on the plantar aspect and has spread to the medial aspect. She does have a history of diabetes mellitus type 2 she also has hypertension along with congestive heart failure. With that being said she was supposed to be having knee surgery on her right knee but this was postponed by the surgeon and she was referred to [place] due to the blister noted. Fortunately there is no signs of active infection at this time. With that being said I am concerned about the fact that this could develop into infection. Obviously we want to prevent such from happening. She does note that she been placed on a antibiotic for urinary tract infection by her primary care provider she has that to pick up. I asked her to call and let us know what that antibiotic was so that we can put that in the chart. For that reason I am not can give her anything empirically at this point. The patient cannot remember any injury that she had to this region in  fact she really does not know how this began at all other than the fact that it "just showed up". 01/05/2020 upon evaluation today patient appears to be doing about the same in regard to her heel ulcer. She has been tolerating the dressing changes without complication. Fortunately there is no signs of active infection at this time. No fevers, chills, nausea, vomiting, or diarrhea. With  that being said I do believe the skin is getting need to be removed from the heel where this is somewhat deflated there is still a lot of blood collecting underneath and I think this is not can I do her any good to be perfectly honest. Plus we do not really know exactly what everything looks like underneath as far as the actual wound is concerned. Obviously we need to figure that out. 01/12/2020 upon evaluation today patient's wound actually appears to be doing excellent in fact this is very close to complete resolution. Subsequently I also did send a culture after removing the blood blister last week and the patient did not have any bacteria noted just normal skin flora and no growth otherwise after 2 days. Fortunately there is no signs of active infection at this time systemically or locally. Overall I feel that she is doing excellent. 01/19/2020 upon evaluation today patient appears to be doing excellent at this time in regard to her heel ulcer for the most part. The one issue that we do see is that she is having a new blistered area that amount to clear away some of the skin from today. This seems to be due to her foot slipping in the offloading shoe that she currently is utilizing. Fortunately there is no signs of active infection at this time. No fevers, chills, nausea, vomiting, or diarrhea. Objective Constitutional Well-nourished and well-hydrated in no acute distress. Vitals Time Taken: 12:50 PM, Height: 62 in, Weight: 195 lbs, BMI: 35.7, Temperature: 97.8 F, Pulse: 70 bpm, Respiratory Rate: 16  breaths/min, Blood Pressure: 149/82 mmHg. Respiratory normal breathing without difficulty. Psychiatric this patient is able to make decisions and demonstrates good insight into disease process. Alert and Oriented x 3. pleasant and cooperative. General Notes: Patient's wound bed currently showed signs of good granulation there does not appear to be any evidence of active infection overall very pleased with where things stand. I was able to clear away some of the slough and skin that was over the surface of the wound today which the patient tolerated without complication once debridement was complete she has just a very superficial area of opening that she is able to stay off of it for the next week I think this will make a big difference for her as far as healing is concerned. Integumentary (Hair, Skin) Wound #1 status is Open. Original cause of wound was Blister. The wound is located on the Right,Medial Foot. The wound measures 0.5cm length x 1.4cm width x 0.1cm depth; 0.55cm^2 area and 0.055cm^3 volume. There is Fat Layer (Subcutaneous Tissue) Exposed exposed. There is no [name], [name] ([id number]) tunneling or undermining noted. There is a medium amount of serosanguineous drainage noted. The wound margin is flat and intact. There is large (67-100%) granulation within the wound bed. There is a small (1-33%) amount of necrotic tissue within the wound bed including Adherent Slough. Assessment Active Problems ICD-10 Type 2 diabetes mellitus with foot ulcer Non-pressure chronic ulcer of other part of right foot limited to breakdown of skin Essential (primary) hypertension Chronic combined systolic (congestive) and diastolic (congestive) heart failure Procedures Wound #1 Pre-procedure diagnosis of Wound #1 is a Diabetic Wound/Ulcer of the Lower Extremity located on the Right,Medial Foot .Severity of Tissue Pre Debridement is: Fat layer exposed. There was a Excisional Skin/Subcutaneous Tissue  Debridement with a total area of 0.7 sq cm performed by STONE III, Aunya Lemler E., PA-C. With the following instrument(s): Curette, Forceps, and Scissors to remove Viable and  Non-Viable tissue/material. Material removed includes Callus and Subcutaneous Tissue and after achieving pain control using Lidocaine. A time out was conducted at 13:21, prior to the start of the procedure. There was no bleeding. The procedure was tolerated well. Post Debridement Measurements: 0.5cm length x 1.4cm width x 0.1cm depth; 0.055cm^3 volume. Character of Wound/Ulcer Post Debridement is stable. Severity of Tissue Post Debridement is: Fat layer exposed. Post procedure Diagnosis Wound #1: Same as Pre-Procedure Plan Wound Cleansing: Wound #1 Right,Medial Foot: May Shower, gently pat wound dry prior to applying new dressing. Primary Wound Dressing: Wound #1 Right,Medial Foot: Silver Alginate Secondary Dressing: Wound #1 Right,Medial Foot: ABD and Kerlix/Conform Dressing Change Frequency: Wound #1 Right,Medial Foot: Change dressing every other day. Follow-up Appointments: Wound #1 Right,Medial Foot: Return Appointment in 1 week. Edema Control: Elevate legs to the level of the heart and pump ankles as often as possible Other: - TubiGrip G Off-Loading: Wound #1 Right,Medial Foot: Open toe surgical shoe with peg assist. 1. I would recommend currently that we go ahead and continue with the wound care measures as before specifically with regard to the silver alginate dressing which I think is doing a good job for her at this point. 2. I am also can recommend that we continue with a ABD pad with roll gauze to secure. 3. I would also suggest she continue to wear a shoe when she has to do any walking. With that being said if she is not having to walk that would be ideal. I went into great detail about how much I would avoid walking if at all possible to try to help this heal appropriately. We will see patient back for  reevaluation in 1 week here in the clinic. If anything worsens or changes patient will contact our office for additional recommendations. [name], [name] ([id number]) Electronic Signature(s) Signed: 01/19/2020 2:23:26 PM By: Worthy Keeler PA-C Entered By: Worthy Keeler on 01/19/2020 14:23:26 [name], [name] ([id number]) -------------------------------------------------------------------------------- SuperBill Details Patient Name: Gloria Both A. Date of Service: 01/19/2020 Medical Record Number: [id number] Patient Account Number: [id number] Date of Birth/Sex: 1956-06-28 (64 y.o. F) Treating RN: Army Melia Primary Care Provider: [name] Other Clinician: Referring Provider: [name] Treating Provider/Extender: Melburn Hake, Ralonda Tartt Weeks in Treatment: 3 Diagnosis Coding ICD-10 Codes Code Description E11.621 Type 2 diabetes mellitus with foot ulcer L97.511 Non-pressure chronic ulcer of other part of right foot limited to breakdown of skin I10 Essential (primary) hypertension I50.42 Chronic combined systolic (congestive) and diastolic (congestive) heart failure Facility Procedures CPT4 Code: [id number] Description: 76195 - DEB SUBQ TISSUE 20 SQ CM/< Modifier: Quantity: 1 CPT4 Code: Description: ICD-10 Diagnosis Description L97.511 Non-pressure chronic ulcer of other part of right foot limited to breakd Modifier: own of skin Quantity: Physician Procedures CPT4 Code: [id number] Description: 11042 - WC PHYS SUBQ TISS 20 SQ CM Modifier: Quantity: 1 CPT4 Code: Description: ICD-10 Diagnosis Description L97.511 Non-pressure chronic ulcer of other part of right foot limited to breakd Modifier: own of skin Quantity: Electronic Signature(s) Signed: 01/19/2020 2:23:38 PM By: Worthy Keeler PA-C Entered By: Worthy Keeler on 01/19/2020 14:23:37

## 2020-01-20 ENCOUNTER — Inpatient Hospital Stay: Payer: Medicare HMO

## 2020-01-20 VITALS — BP 141/86 | HR 72 | Temp 96.7°F | Resp 18 | Wt 194.6 lb

## 2020-01-20 DIAGNOSIS — F329 Major depressive disorder, single episode, unspecified: Secondary | ICD-10-CM | POA: Diagnosis not present

## 2020-01-20 DIAGNOSIS — Z170 Estrogen receptor positive status [ER+]: Secondary | ICD-10-CM | POA: Diagnosis not present

## 2020-01-20 DIAGNOSIS — C7951 Secondary malignant neoplasm of bone: Secondary | ICD-10-CM | POA: Diagnosis not present

## 2020-01-20 DIAGNOSIS — Z95828 Presence of other vascular implants and grafts: Secondary | ICD-10-CM

## 2020-01-20 DIAGNOSIS — F419 Anxiety disorder, unspecified: Secondary | ICD-10-CM | POA: Diagnosis not present

## 2020-01-20 DIAGNOSIS — Z5111 Encounter for antineoplastic chemotherapy: Secondary | ICD-10-CM | POA: Diagnosis not present

## 2020-01-20 DIAGNOSIS — D649 Anemia, unspecified: Secondary | ICD-10-CM | POA: Diagnosis not present

## 2020-01-20 DIAGNOSIS — Z7189 Other specified counseling: Secondary | ICD-10-CM

## 2020-01-20 DIAGNOSIS — C50212 Malignant neoplasm of upper-inner quadrant of left female breast: Secondary | ICD-10-CM | POA: Diagnosis not present

## 2020-01-20 DIAGNOSIS — N184 Chronic kidney disease, stage 4 (severe): Secondary | ICD-10-CM | POA: Diagnosis not present

## 2020-01-20 LAB — CBC WITH DIFFERENTIAL/PLATELET
Abs Immature Granulocytes: 0.17 10*3/uL — ABNORMAL HIGH (ref 0.00–0.07)
Basophils Absolute: 0.1 10*3/uL (ref 0.0–0.1)
Basophils Relative: 1 %
Eosinophils Absolute: 0.1 10*3/uL (ref 0.0–0.5)
Eosinophils Relative: 2 %
HCT: 26 % — ABNORMAL LOW (ref 36.0–46.0)
Hemoglobin: 8.8 g/dL — ABNORMAL LOW (ref 12.0–15.0)
Immature Granulocytes: 3 %
Lymphocytes Relative: 18 %
Lymphs Abs: 1.1 10*3/uL (ref 0.7–4.0)
MCH: 31.2 pg (ref 26.0–34.0)
MCHC: 33.8 g/dL (ref 30.0–36.0)
MCV: 92.2 fL (ref 80.0–100.0)
Monocytes Absolute: 0.2 10*3/uL (ref 0.1–1.0)
Monocytes Relative: 4 %
Neutro Abs: 4.3 10*3/uL (ref 1.7–7.7)
Neutrophils Relative %: 72 %
Platelets: 287 10*3/uL (ref 150–400)
RBC: 2.82 MIL/uL — ABNORMAL LOW (ref 3.87–5.11)
RDW: 12.4 % (ref 11.5–15.5)
WBC: 6.1 10*3/uL (ref 4.0–10.5)
nRBC: 0 % (ref 0.0–0.2)

## 2020-01-20 LAB — BASIC METABOLIC PANEL
Anion gap: 10 (ref 5–15)
BUN: 33 mg/dL — ABNORMAL HIGH (ref 8–23)
CO2: 23 mmol/L (ref 22–32)
Calcium: 8.6 mg/dL — ABNORMAL LOW (ref 8.9–10.3)
Chloride: 101 mmol/L (ref 98–111)
Creatinine, Ser: 2.66 mg/dL — ABNORMAL HIGH (ref 0.44–1.00)
GFR calc Af Amer: 21 mL/min — ABNORMAL LOW (ref >60)
GFR calc non Af Amer: 18 mL/min — ABNORMAL LOW (ref >60)
Glucose, Bld: 289 mg/dL — ABNORMAL HIGH (ref 70–99)
Potassium: 4.3 mmol/L (ref 3.5–5.1)
Sodium: 134 mmol/L — ABNORMAL LOW (ref 135–145)

## 2020-01-20 MED ORDER — PROCHLORPERAZINE MALEATE 10 MG PO TABS
10.0000 mg | ORAL_TABLET | Freq: Once | ORAL | Status: AC
  Administered 2020-01-20: 10 mg via ORAL
  Filled 2020-01-20: qty 1

## 2020-01-20 MED ORDER — SODIUM CHLORIDE 0.9 % IV SOLN
2.0000 mg | Freq: Once | INTRAVENOUS | Status: AC
  Administered 2020-01-20: 2 mg via INTRAVENOUS
  Filled 2020-01-20: qty 4

## 2020-01-20 MED ORDER — SODIUM CHLORIDE 0.9 % IV SOLN
Freq: Once | INTRAVENOUS | Status: AC
  Filled 2020-01-20: qty 250

## 2020-01-20 MED ORDER — HEPARIN SOD (PORK) LOCK FLUSH 100 UNIT/ML IV SOLN
500.0000 [IU] | Freq: Once | INTRAVENOUS | Status: AC
  Administered 2020-01-20: 500 [IU] via INTRAVENOUS
  Filled 2020-01-20: qty 5

## 2020-01-20 MED ORDER — SODIUM CHLORIDE 0.9% FLUSH
10.0000 mL | Freq: Once | INTRAVENOUS | Status: AC
  Administered 2020-01-20: 10 mL via INTRAVENOUS
  Filled 2020-01-20: qty 10

## 2020-01-20 MED ORDER — HEPARIN SOD (PORK) LOCK FLUSH 100 UNIT/ML IV SOLN
INTRAVENOUS | Status: AC
  Filled 2020-01-20: qty 5

## 2020-01-20 NOTE — Progress Notes (Signed)
Basic Metabolic Panel done today. MD, Dr. Rogue Bussing, notified and aware. Per MD order: Comprehensive Metabolic Panel does not need to be done today; reference Basic Metabolic Panel results and proceed with scheduled Eribulin Mesylate treatment at this time.

## 2020-01-26 ENCOUNTER — Encounter: Payer: Medicare HMO | Admitting: Physician Assistant

## 2020-01-26 ENCOUNTER — Other Ambulatory Visit: Payer: Self-pay

## 2020-01-26 DIAGNOSIS — E11621 Type 2 diabetes mellitus with foot ulcer: Secondary | ICD-10-CM | POA: Diagnosis not present

## 2020-01-26 DIAGNOSIS — I5042 Chronic combined systolic (congestive) and diastolic (congestive) heart failure: Secondary | ICD-10-CM | POA: Diagnosis not present

## 2020-01-26 DIAGNOSIS — L97511 Non-pressure chronic ulcer of other part of right foot limited to breakdown of skin: Secondary | ICD-10-CM | POA: Diagnosis not present

## 2020-01-26 DIAGNOSIS — Z923 Personal history of irradiation: Secondary | ICD-10-CM | POA: Diagnosis not present

## 2020-01-26 DIAGNOSIS — Z9221 Personal history of antineoplastic chemotherapy: Secondary | ICD-10-CM | POA: Diagnosis not present

## 2020-01-26 DIAGNOSIS — I110 Hypertensive heart disease with heart failure: Secondary | ICD-10-CM | POA: Diagnosis not present

## 2020-01-26 NOTE — Progress Notes (Addendum)
ANALISA, SLEDD ([id number]) Visit Report for 01/26/2020 Chief Complaint Document Details Patient Name: [name], [name] Date of Service: 01/26/2020 1:15 PM Medical Record Number: [id number] Patient Account Number: [id number] Date of Birth/Sex: Dec 03, 1955 (64 y.o. F) Treating RN: Army Melia Primary Care Provider: [name] Other Clinician: Referring Provider: [name] Treating Provider/Extender: Melburn Hake, Omaya Nieland Weeks in Treatment: 4 Information Obtained from: Patient Chief Complaint Right foot ulcer Electronic Signature(s) Signed: 01/26/2020 1:27:27 PM By: Worthy Keeler PA-C Entered By: Worthy Keeler on 01/26/2020 13:27:27 Sees, [name] ([id number]) -------------------------------------------------------------------------------- HPI Details Patient Name: [name] Date of Service: 01/26/2020 1:15 PM Medical Record Number: [id number] Patient Account Number: [id number] Date of Birth/Sex: 22-Jan-1956 (64 y.o. F) Treating RN: Army Melia Primary Care Provider: [name] Other Clinician: Referring Provider: [name] Treating Provider/Extender: Melburn Hake, Ladan Vanderzanden Weeks in Treatment: 4 History of Present Illness HPI Description: 12/27/2019 upon evaluation today patient appears to be doing somewhat poorly upon initial inspection here in the clinic. She has unfortunately been having issues with for the past week [name] blister over her right heel that started on the plantar aspect and has spread to the medial aspect. She does have [name] history of diabetes mellitus type 2 she also has hypertension along with congestive heart failure. With that being said she was supposed to be having knee surgery on her right knee but this was postponed by the surgeon and she was referred to [place] due to the blister noted. Fortunately there is no signs of active infection at this time. With that being said I am concerned about the fact that this could develop into infection.  Obviously we want to prevent such from happening. She does note that she been placed on [name] antibiotic for urinary tract infection by her primary care provider she has that to pick up. I asked her to call and let us know what that antibiotic was so that we can put that in the chart. For that reason I am not can give her anything empirically at this point. The patient cannot remember any injury that she had to this region in fact she really does not know how this began at all other than the fact that it "just showed up". 01/05/2020 upon evaluation today patient appears to be doing about the same in regard to her heel ulcer. She has been tolerating the dressing changes without complication. Fortunately there is no signs of active infection at this time. No fevers, chills, nausea, vomiting, or diarrhea. With that being said I do believe the skin is getting need to be removed from the heel where this is somewhat deflated there is still [name] lot of blood collecting underneath and I think this is not can I do her any good to be perfectly honest. Plus we do not really know exactly what everything looks like underneath as far as the actual wound is concerned. Obviously we need to figure that out. 01/12/2020 upon evaluation today patient's wound actually appears to be doing excellent in fact this is very close to complete resolution. Subsequently I also did send [name] culture after removing the blood blister last week and the patient did not have any bacteria noted just normal skin flora and no growth otherwise after 2 days. Fortunately there is no signs of active infection at this time systemically or locally. Overall I feel that she is doing excellent. 01/19/2020 upon evaluation today patient appears to be doing excellent at this time in regard to her heel ulcer  for the most part. The one issue that we do see is that she is having [name] new blistered area that amount to clear away some of the skin from today. This seems to be  due to her foot slipping in the offloading shoe that she currently is utilizing. Fortunately there is no signs of active infection at this time. No fevers, chills, nausea, vomiting, or diarrhea. 01/26/2020 upon evaluation today patient's wound actually showed signs of excellent improvement. She has made great progress even since last week's visit. I do believe that she continues to tolerate the dressing changes without any complication and I am very pleased with that as well. In general I think that we will likely continue with the current measures as long as we are continuing to see the results that we are at this point. Electronic Signature(s) Signed: 01/26/2020 2:53:06 PM By: Worthy Keeler PA-C Entered By: Worthy Keeler on 01/26/2020 14:53:05 Woodbeck, [name] ([id number]) -------------------------------------------------------------------------------- Physical Exam Details Patient Name: [name] Date of Service: 01/26/2020 1:15 PM Medical Record Number: [id number] Patient Account Number: [id number] Date of Birth/Sex: 1956-07-03 (64 y.o. F) Treating RN: Army Melia Primary Care Provider: [name] Other Clinician: Referring Provider: [name] Treating Provider/Extender: STONE III, Cameka Rae Weeks in Treatment: 4 Constitutional Well-nourished and well-hydrated in no acute distress. Respiratory normal breathing without difficulty. Psychiatric this patient is able to make decisions and demonstrates good insight into disease process. Alert and Oriented x 3. pleasant and cooperative. Notes Patient's wound bed currently showed signs of good granulation there was no significant slough buildup that I could not clean away which is saline and gauze that was performed today without complication post debridement the wound bed appears to be doing much better. Electronic Signature(s) Signed: 01/26/2020 2:53:20 PM By: Worthy Keeler PA-C Entered By: Worthy Keeler on 01/26/2020  14:53:20 Mehaffey, [name] ([id number]) -------------------------------------------------------------------------------- Physician Orders Details Patient Name: [name] Date of Service: 01/26/2020 1:15 PM Medical Record Number: [id number] Patient Account Number: [id number] Date of Birth/Sex: Oct 19, 1955 (64 y.o. F) Treating RN: Army Melia Primary Care Provider: [name] Other Clinician: Referring Provider: [name] Treating Provider/Extender: Melburn Hake, Kacey Vicuna Weeks in Treatment: 4 Verbal / Phone Orders: No Diagnosis Coding ICD-10 Coding Code Description E11.621 Type 2 diabetes mellitus with foot ulcer L97.511 Non-pressure chronic ulcer of other part of right foot limited to breakdown of skin I10 Essential (primary) hypertension I50.42 Chronic combined systolic (congestive) and diastolic (congestive) heart failure Wound Cleansing Wound #1 Right,Medial Foot o May Shower, gently pat wound dry prior to applying new dressing. Primary Wound Dressing Wound #1 Right,Medial Foot o Silver Alginate Secondary Dressing Wound #1 Right,Medial Foot o ABD and Kerlix/Conform Dressing Change Frequency Wound #1 Right,Medial Foot o Change dressing every other day. Follow-up Appointments Wound #1 Right,Medial Foot o Return Appointment in 1 week. Edema Control o Elevate legs to the level of the heart and pump ankles as often as possible o Other: - TubiGrip G Off-Loading Wound #1 Right,Medial Foot o Open toe surgical shoe with peg assist. Electronic Signature(s) Signed: 01/26/2020 3:50:50 PM By: Army Melia Signed: 01/26/2020 4:29:48 PM By: Worthy Keeler PA-C Entered By: Army Melia on 01/26/2020 13:54:09 Mille, Salimah AMarland Kitchen ([id number]) -------------------------------------------------------------------------------- Problem List Details Patient Name: [name] Date of Service: 01/26/2020 1:15 PM Medical Record Number: [id number] Patient Account  Number: [id number] Date of Birth/Sex: 1956-06-05 (64 y.o. F) Treating RN: Army Melia Primary Care Provider: [name] Other Clinician: Referring Provider: [name] Treating  Provider/Extender: Melburn Hake, Laurencia Roma Weeks in Treatment: 4 Active Problems ICD-10 Encounter Code Description Active Date MDM Diagnosis E11.621 Type 2 diabetes mellitus with foot ulcer 12/27/2019 No Yes L97.511 Non-pressure chronic ulcer of other part of right foot limited to 12/27/2019 No Yes breakdown of skin I10 Essential (primary) hypertension 12/27/2019 No Yes I50.42 Chronic combined systolic (congestive) and diastolic (congestive) heart 12/27/2019 No Yes failure Inactive Problems Resolved Problems Electronic Signature(s) Signed: 01/26/2020 1:27:22 PM By: Worthy Keeler PA-C Entered By: Worthy Keeler on 01/26/2020 13:27:22 Vasko, Latessa AMarland Kitchen ([id number]) -------------------------------------------------------------------------------- Progress Note Details Patient Name: [name] Date of Service: 01/26/2020 1:15 PM Medical Record Number: [id number] Patient Account Number: [id number] Date of Birth/Sex: Nov 09, 1955 (64 y.o. F) Treating RN: Army Melia Primary Care Provider: [name] Other Clinician: Referring Provider: [name] Treating Provider/Extender: Melburn Hake, Zareth Rippetoe Weeks in Treatment: 4 Subjective Chief Complaint Information obtained from Patient Right foot ulcer History of Present Illness (HPI) 12/27/2019 upon evaluation today patient appears to be doing somewhat poorly upon initial inspection here in the clinic. She has unfortunately been having issues with for the past week [name] blister over her right heel that started on the plantar aspect and has spread to the medial aspect. She does have [name] history of diabetes mellitus type 2 she also has hypertension along with congestive heart failure. With that being said she was supposed to be having knee surgery on her right  knee but this was postponed by the surgeon and she was referred to [place] due to the blister noted. Fortunately there is no signs of active infection at this time. With that being said I am concerned about the fact that this could develop into infection. Obviously we want to prevent such from happening. She does note that she been placed on [name] antibiotic for urinary tract infection by her primary care provider she has that to pick up. I asked her to call and let us know what that antibiotic was so that we can put that in the chart. For that reason I am not can give her anything empirically at this point. The patient cannot remember any injury that she had to this region in fact she really does not know how this began at all other than the fact that it "just showed up". 01/05/2020 upon evaluation today patient appears to be doing about the same in regard to her heel ulcer. She has been tolerating the dressing changes without complication. Fortunately there is no signs of active infection at this time. No fevers, chills, nausea, vomiting, or diarrhea. With that being said I do believe the skin is getting need to be removed from the heel where this is somewhat deflated there is still [name] lot of blood collecting underneath and I think this is not can I do her any good to be perfectly honest. Plus we do not really know exactly what everything looks like underneath as far as the actual wound is concerned. Obviously we need to figure that out. 01/12/2020 upon evaluation today patient's wound actually appears to be doing excellent in fact this is very close to complete resolution. Subsequently I also did send [name] culture after removing the blood blister last week and the patient did not have any bacteria noted just normal skin flora and no growth otherwise after 2 days. Fortunately there is no signs of active infection at this time systemically or locally. Overall I feel that she is doing excellent. 01/19/2020 upon  evaluation today patient appears to be  doing excellent at this time in regard to her heel ulcer for the most part. The one issue that we do see is that she is having [name] new blistered area that amount to clear away some of the skin from today. This seems to be due to her foot slipping in the offloading shoe that she currently is utilizing. Fortunately there is no signs of active infection at this time. No fevers, chills, nausea, vomiting, or diarrhea. 01/26/2020 upon evaluation today patient's wound actually showed signs of excellent improvement. She has made great progress even since last week's visit. I do believe that she continues to tolerate the dressing changes without any complication and I am very pleased with that as well. In general I think that we will likely continue with the current measures as long as we are continuing to see the results that we are at this point. Objective Constitutional Well-nourished and well-hydrated in no acute distress. Vitals Time Taken: 1:25 PM, Height: 62 in, Weight: 195 lbs, BMI: 35.7, Temperature: 97.8 F, Pulse: 72 bpm, Respiratory Rate: 18 breaths/min, Blood Pressure: 168/88 mmHg. Respiratory normal breathing without difficulty. Psychiatric this patient is able to make decisions and demonstrates good insight into disease process. Alert and Oriented x 3. pleasant and cooperative. General Notes: Patient's wound bed currently showed signs of good granulation there was no significant slough buildup that I could not clean away which is saline and gauze that was performed today without complication post debridement the wound bed appears to be doing much better. Integumentary (Hair, Skin) Diop, Dayja [name]. ([id number]) Wound #1 status is Open. Original cause of wound was Blister. The wound is located on the Right,Medial Foot. The wound measures 1.4cm length x 2cm width x 0.1cm depth; 2.199cm^2 area and 0.22cm^3 volume. There is Fat Layer (Subcutaneous Tissue)  Exposed exposed. There is [name] medium amount of serosanguineous drainage noted. The wound margin is flat and intact. There is large (67-100%) granulation within the wound bed. There is [name] small (1-33%) amount of necrotic tissue within the wound bed including Adherent Slough. Assessment Active Problems ICD-10 Type 2 diabetes mellitus with foot ulcer Non-pressure chronic ulcer of other part of right foot limited to breakdown of skin Essential (primary) hypertension Chronic combined systolic (congestive) and diastolic (congestive) heart failure Plan Wound Cleansing: Wound #1 Right,Medial Foot: May Shower, gently pat wound dry prior to applying new dressing. Primary Wound Dressing: Wound #1 Right,Medial Foot: Silver Alginate Secondary Dressing: Wound #1 Right,Medial Foot: ABD and Kerlix/Conform Dressing Change Frequency: Wound #1 Right,Medial Foot: Change dressing every other day. Follow-up Appointments: Wound #1 Right,Medial Foot: Return Appointment in 1 week. Edema Control: Elevate legs to the level of the heart and pump ankles as often as possible Other: - TubiGrip G Off-Loading: Wound #1 Right,Medial Foot: Open toe surgical shoe with peg assist. 1. I would recommend currently that we going to continue with the wound care measures as before with regard to the silver alginate dressing which I do think has been beneficial for her. 2. I am also can suggest that we continue with appropriate offloading she knows to stay off of this and tells me she has been minimizing her walking to only what she absolutely has to do. We will see patient back for reevaluation in 1 week here in the clinic. If anything worsens or changes patient will contact our office for additional recommendations. Electronic Signature(s) Signed: 01/26/2020 2:53:54 PM By: Worthy Keeler PA-C Entered By: Worthy Keeler on 01/26/2020 14:53:53 Quirk, Naima [name].  (  [id number]) -------------------------------------------------------------------------------- SuperBill Details Patient Name: [name], [name] Date of Service: 01/26/2020 Medical Record Number: [id number] Patient Account Number: [id number] Date of Birth/Sex: 10-31-1955 (64 y.o. F) Treating RN: Army Melia Primary Care Provider: [name] Other Clinician: Referring Provider: [name] Treating Provider/Extender: Melburn Hake, Talise Sligh Weeks in Treatment: 4 Diagnosis Coding ICD-10 Codes Code Description E11.621 Type 2 diabetes mellitus with foot ulcer L97.511 Non-pressure chronic ulcer of other part of right foot limited to breakdown of skin I10 Essential (primary) hypertension I50.42 Chronic combined systolic (congestive) and diastolic (congestive) heart failure Facility Procedures CPT4 Code: [id number] Description: 99213 - WOUND CARE VISIT-LEV 3 EST PT Modifier: Quantity: 1 Physician Procedures CPT4 Code: [id number] Description: 99213 - WC PHYS LEVEL 3 - EST PT Modifier: Quantity: 1 CPT4 Code: Description: ICD-10 Diagnosis Description E11.621 Type 2 diabetes mellitus with foot ulcer L97.511 Non-pressure chronic ulcer of other part of right foot limited to break I10 Essential (primary) hypertension I50.42 Chronic combined systolic (congestive)  and diastolic (congestive) heart Modifier: down of skin failure Quantity: Electronic Signature(s) Signed: 01/26/2020 2:54:08 PM By: Worthy Keeler PA-C Entered By: Worthy Keeler on 01/26/2020 14:54:07

## 2020-01-26 NOTE — Progress Notes (Signed)
[name], [name] ([id number]) Visit Report for 01/26/2020 Arrival Information Details Patient Name: [name], [name] Date of Service: 01/26/2020 1:15 PM Medical Record Number: [id number] Patient Account Number: [id number] Date of Birth/Sex: Apr 23, 1956 (64 y.o. F) Treating RN: Army Melia Primary Care Jobina Maita: [name] Other Clinician: Referring Wylodean Shimmel: [name] Treating Schon Zeiders/Extender: Melburn Hake, HOYT Weeks in Treatment: 4 Visit Information History Since Last Visit Added or deleted any medications: No Patient Arrived: Wheel Chair Any new allergies or adverse reactions: No Arrival Time: 13:21 Had [name] fall or experienced change in No Accompanied By: fiance activities of daily living that may affect Transfer Assistance: None risk of falls: Patient Identification Verified: Yes Signs or symptoms of abuse/neglect since last visito No Secondary Verification Process Completed: Yes Hospitalized since last visit: No Patient Has Alerts: Yes Implantable device outside of the clinic excluding No Patient Alerts: DMII cellular tissue based products placed in the center since last visit: Has Dressing in Place as Prescribed: Yes Has Compression in Place as Prescribed: Yes Pain Present Now: No Electronic Signature(s) Signed: 01/26/2020 4:23:12 PM By: Lorine Bears RCP, RRT, CHT Entered By: Lorine Bears on 01/26/2020 13:26:28 [place], [name] ([id number]) -------------------------------------------------------------------------------- Clinic Level of Care Assessment Details Patient Name: [name] Date of Service: 01/26/2020 1:15 PM Medical Record Number: [id number] Patient Account Number: [id number] Date of Birth/Sex: 11-06-1955 (64 y.o. F) Treating RN: Army Melia Primary Care Aryaa Bunting: [name] Other Clinician: Referring Demonte Dobratz: [name] Treating Madelline Eshbach/Extender: Melburn Hake, HOYT Weeks in Treatment: 4 Clinic Level  of Care Assessment Items TOOL 4 Quantity Score []  - Use when only an EandM is performed on FOLLOW-UP visit 0 ASSESSMENTS - Nursing Assessment / Reassessment X - Reassessment of Co-morbidities (includes updates in patient status) 1 10 X- 1 5 Reassessment of Adherence to Treatment Plan ASSESSMENTS - Wound and Skin Assessment / Reassessment X - Simple Wound Assessment / Reassessment - one wound 1 5 []  - 0 Complex Wound Assessment / Reassessment - multiple wounds []  - 0 Dermatologic / Skin Assessment (not related to wound area) ASSESSMENTS - Focused Assessment []  - Circumferential Edema Measurements - multi extremities 0 []  - 0 Nutritional Assessment / Counseling / Intervention []  - 0 Lower Extremity Assessment (monofilament, tuning fork, pulses) []  - 0 Peripheral Arterial Disease Assessment (using hand held doppler) ASSESSMENTS - Ostomy and/or Continence Assessment and Care []  - Incontinence Assessment and Management 0 []  - 0 Ostomy Care Assessment and Management (repouching, etc.) PROCESS - Coordination of Care X - Simple Patient / Family Education for ongoing care 1 15 []  - 0 Complex (extensive) Patient / Family Education for ongoing care X- 1 10 Staff obtains Programmer, systems, Records, Test Results / Process Orders []  - 0 Staff telephones HHA, Nursing Homes / Clarify orders / etc []  - 0 Routine Transfer to another Facility (non-emergent condition) []  - 0 Routine Hospital Admission (non-emergent condition) []  - 0 New Admissions / Biomedical engineer / Ordering NPWT, Apligraf, etc. []  - 0 Emergency Hospital Admission (emergent condition) X- 1 10 Simple Discharge Coordination []  - 0 Complex (extensive) Discharge Coordination PROCESS - Special Needs []  - Pediatric / Minor Patient Management 0 []  - 0 Isolation Patient Management []  - 0 Hearing / Language / Visual special needs []  - 0 Assessment of Community assistance (transportation, D/C planning, etc.) []  -  0 Additional assistance / Altered mentation []  - 0 Support Surface(s) Assessment (bed, cushion, seat, etc.) INTERVENTIONS - Wound Cleansing / Measurement [name], [name] ([id number]) X- 1 5 Simple  Wound Cleansing - one wound []  - 0 Complex Wound Cleansing - multiple wounds X- 1 5 Wound Imaging (photographs - any number of wounds) []  - 0 Wound Tracing (instead of photographs) X- 1 5 Simple Wound Measurement - one wound []  - 0 Complex Wound Measurement - multiple wounds INTERVENTIONS - Wound Dressings []  - Small Wound Dressing one or multiple wounds 0 X- 1 15 Medium Wound Dressing one or multiple wounds []  - 0 Large Wound Dressing one or multiple wounds []  - 0 Application of Medications - topical []  - 0 Application of Medications - injection INTERVENTIONS - Miscellaneous []  - External ear exam 0 []  - 0 Specimen Collection (cultures, biopsies, blood, body fluids, etc.) []  - 0 Specimen(s) / Culture(s) sent or taken to Lab for analysis []  - 0 Patient Transfer (multiple staff / Civil Service fast streamer / Similar devices) []  - 0 Simple Staple / Suture removal (25 or less) []  - 0 Complex Staple / Suture removal (26 or more) []  - 0 Hypo / Hyperglycemic Management (close monitor of Blood Glucose) []  - 0 Ankle / Brachial Index (ABI) - do not check if billed separately X- 1 5 Vital Signs Has the patient been seen at the hospital within the last three years: Yes Total Score: 90 Level Of Care: New/Established - Level 3 Electronic Signature(s) Signed: 01/26/2020 3:50:50 PM By: Army Melia Entered By: Army Melia on 01/26/2020 13:54:33 [name], [name] ([id number]) -------------------------------------------------------------------------------- Encounter Discharge Information Details Patient Name: [name] Date of Service: 01/26/2020 1:15 PM Medical Record Number: [id number] Patient Account Number: [id number] Date of Birth/Sex: [birth year] (64 y.o. F) Treating RN: Army Melia Primary Care Athene Schuhmacher: [name] Other Clinician: Referring Ludger Bones: [name] Treating Key Cen/Extender: Melburn Hake, HOYT Weeks in Treatment: 4 Encounter Discharge Information Items Discharge Condition: Stable Ambulatory Status: Wheelchair Discharge Destination: Home Transportation: Private Auto Accompanied By: family Schedule Follow-up Appointment: Yes Clinical Summary of Care: Electronic Signature(s) Signed: 01/26/2020 3:50:50 PM By: Army Melia Entered By: Army Melia on 01/26/2020 13:55:09 Daino, Gloria AMarland Kitchen ([id number]) -------------------------------------------------------------------------------- Lower Extremity Assessment Details Patient Name: [name] Date of Service: 01/26/2020 1:15 PM Medical Record Number: [id number] Patient Account Number: [id number] Date of Birth/Sex: 04/10/1956 (64 y.o. F) Treating RN: Army Melia Primary Care Emanuela Runnion: [name] Other Clinician: Referring Aland Chestnutt: [name] Treating Gad Aymond/Extender: STONE III, HOYT Weeks in Treatment: 4 Edema Assessment Assessed: [Left: No] [Right: No] Edema: [Left: N] [Right: o] Vascular Assessment Pulses: Dorsalis Pedis Palpable: [Right:Yes] Electronic Signature(s) Signed: 01/26/2020 3:50:50 PM By: Army Melia Entered By: Army Melia on 01/26/2020 13:36:12 [name], [name] ([id number]) -------------------------------------------------------------------------------- Multi Wound Chart Details Patient Name: [name] Date of Service: 01/26/2020 1:15 PM Medical Record Number: [id number] Patient Account Number: [id number] Date of Birth/Sex: 13-Dec-1955 (64 y.o. 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 Comorbid History: Cataracts, Asthma, Congestive N/[name] N/[name] Heart Failure, Hypertension, Type II Diabetes, Received Chemotherapy, Received Radiation Date Acquired: 12/20/2019 N/[name] N/[name] Weeks of Treatment: 4 N/[name] N/[name] Wound Status: Open N/[name] N/[name] Measurements L x W x D (cm) 1.4x2x0.1 N/[name] N/[name] Area (cm) : 2.199 N/[name] N/[name] Volume (cm) : 0.22 N/[name] N/[name] % Reduction in  Area: 87.60% N/[name] N/[name] % Reduction in Volume: 87.50% N/[name] N/[name] Classification: Grade 2 N/[name] N/[name] Exudate Amount: Medium N/[name] N/[name] Exudate Type: Serosanguineous N/[name] N/[name] Exudate Color: red, brown N/[name] N/[name] Wound Margin: Flat and Intact N/[name] N/[name] Granulation Amount: Large (67-100%) N/[name] N/[name] Necrotic Amount: Small (1-33%) N/[name] N/[name] Exposed Structures: Fat Layer (Subcutaneous Tissue) N/[name] N/[name] Exposed: Yes Fascia: No Tendon: No Muscle: No Joint: No Bone: No Epithelialization: Small (1-33%) N/[name] N/[name] Treatment Notes Electronic Signature(s) Signed: 01/26/2020 3:50:50 PM By: Army Melia Entered By: Army Melia on 01/26/2020 13:36:59 [place], [name] ([id number]) -------------------------------------------------------------------------------- [place] Details Patient Name: [name] Date of Service: 01/26/2020 1:15 PM Medical Record Number: [id number] Patient Account Number: [id number] Date of Birth/Sex: 03/17/1956 (64 y.o. F) Treating RN: Army Melia Primary Care Cachet Mccutchen: [name] Other Clinician: Referring Perpetua Elling: [name] Treating Rodolfo Gaster/Extender: Melburn Hake, HOYT Weeks in Treatment: 4 Active Inactive Abuse / Safety / Falls / Self Care Management Nursing Diagnoses: Potential for falls Goals: Patient will not experience any injury related to falls Date Initiated:  12/27/2019 Target Resolution Date: 03/10/2020 Goal Status: Active Interventions: Assess fall risk on admission and as needed Notes: Nutrition Nursing Diagnoses: Impaired glucose control: actual or potential Goals: Patient/caregiver agrees to and verbalizes understanding of need to use nutritional supplements and/or vitamins as prescribed Date Initiated: 12/27/2019 Target Resolution Date: 03/10/2020 Goal Status: Active Interventions: Assess patient nutrition upon admission and as needed per policy Notes: Orientation to the Wound Care Program Nursing Diagnoses: Knowledge deficit related to the wound healing center program Goals: Patient/caregiver will verbalize understanding of the [facility] Date Initiated: 12/27/2019 Target Resolution Date: 03/10/2020 Goal Status: Active Interventions: Provide education on orientation to the wound center Notes: Wound/Skin Impairment Nursing Diagnoses: Impaired tissue integrity Goals: Ulcer/skin breakdown will heal within 14 weeks Date Initiated: 12/27/2019 Target Resolution Date: 03/10/2020 Goal Status: Active [name], [name] ([id number]) Interventions: Assess patient/caregiver ability to obtain necessary supplies Assess patient/caregiver ability to perform ulcer/skin care regimen upon admission and as needed Assess ulceration(s) every visit Notes: Electronic Signature(s) Signed: 01/26/2020 3:50:50 PM By: Army Melia Entered By: Army Melia on 01/26/2020 13:36:52 [name], [name] ([id number]) -------------------------------------------------------------------------------- Pain Assessment Details Patient Name: [name] Date of Service: 01/26/2020 1:15 PM Medical Record Number: [id number] Patient Account Number: [id number] Date of Birth/Sex: 1956/02/11 (64 y.o. F) Treating RN: Army Melia Primary Care Simuel Stebner: [name] Other Clinician: Referring Shamell Suarez: [name] Treating Raihan Kimmel/Extender: Melburn Hake, HOYT Weeks in Treatment: 4 Active Problems Location of Pain Severity and Description of Pain Patient Has Paino No Site Locations Pain Management and Medication Current Pain Management: Electronic Signature(s) Signed: 01/26/2020 3:50:50 PM By: Army Melia Entered By: Army Melia on 01/26/2020 13:35:22 [name], [name] ([id number]) -------------------------------------------------------------------------------- Patient/Caregiver Education Details Patient Name: [name] Date of Service: 01/26/2020 1:15 PM Medical Record Number: [id number] Patient Account Number: [id number] Date of Birth/Gender: 01/12/1956 (64 y.o. F) Treating RN: Army Melia Primary Care Physician: [name] Other Clinician: Referring Physician: [name] Treating Physician/Extender: Sharalyn Ink in Treatment: 4 Education Assessment Education Provided To: Patient Education Topics Provided Wound/Skin Impairment: Handouts: Caring for Your Ulcer Methods: Demonstration, Explain/Verbal Responses: State content correctly Electronic Signature(s) Signed: 01/26/2020 3:50:50 PM By: Army Melia Entered By: Army Melia on 01/26/2020 13:54:43 [name], [name] ([id number]) -------------------------------------------------------------------------------- Wound Assessment Details Patient Name: [name] Date of Service: 01/26/2020 1:15 PM Medical Record Number: [id number] Patient Account Number: [id number] Date of Birth/Sex: 10-18-1955 (64 y.o. F) Treating RN: Army Melia  Primary Care Blaike Newburn: [name] Other Clinician: Referring Leyli Kevorkian: [name] Treating Iveliz Garay/Extender: Melburn Hake, HOYT Weeks in Treatment: 4 Wound Status Wound Number: 1 Primary Diabetic Wound/Ulcer of the Lower Extremity Etiology: Wound Location: Right, Medial Foot Wound Open Wounding Event: Blister Status: Date Acquired: 12/20/2019 Comorbid Cataracts, Asthma, Congestive Heart  Failure, Weeks Of Treatment: 4 History: Hypertension, Type II Diabetes, Received Chemotherapy, Clustered Wound: No Received Radiation Photos Wound Measurements Length: (cm) 1.4 Width: (cm) 2 Depth: (cm) 0.1 Area: (cm) 2.199 Volume: (cm) 0.22 % Reduction in Area: 87.6% % Reduction in Volume: 87.5% Epithelialization: Small (1-33%) Wound Description Classification: Grade 2 Fou Wound Margin: Flat and Intact Slo Exudate Amount: Medium Exudate Type: Serosanguineous Exudate Color: red, brown l Odor After Cleansing: No ugh/Fibrino Yes Wound Bed Granulation Amount: Large (67-100%) Exposed Structure Necrotic Amount: Small (1-33%) Fascia Exposed: No Necrotic Quality: Adherent Slough Fat Layer (Subcutaneous Tissue) Exposed: Yes Tendon Exposed: No Muscle Exposed: No Joint Exposed: No Bone Exposed: No Treatment Notes Wound #1 (Right, Medial Foot) Notes scell, abd, kerlix and tubigrip g, Electronic Signature(s) [name], [name] ([id number]) Signed: 01/26/2020 3:50:50 PM By: Army Melia Entered By: Army Melia on 01/26/2020 13:36:00 [name], [name] ([id number]) -------------------------------------------------------------------------------- Vitals Details Patient Name: [name] Date of Service: 01/26/2020 1:15 PM Medical Record Number: [id number] Patient Account Number: [id number] Date of Birth/Sex: 1955/09/11 (64 y.o. F) Treating RN: Army Melia Primary Care Mekala Winger: [name] Other Clinician: Referring Jovany Disano: [name] Treating Chasyn Cinque/Extender: Melburn Hake, HOYT Weeks in Treatment: 4 Vital Signs Time Taken: 13:25 Temperature (F): 97.8 Height (in): 62 Pulse (bpm): 72 Weight (lbs): 195 Respiratory Rate (breaths/min): 18 Body Mass Index (BMI): 35.7 Blood Pressure (mmHg): 168/88 Reference Range: 80 - 120 mg / dl Electronic Signature(s) Signed: 01/26/2020 4:23:12 PM By: Lorine Bears RCP, RRT, CHT Entered By: Lorine Bears on 01/26/2020 13:27:46

## 2020-02-01 DIAGNOSIS — E782 Mixed hyperlipidemia: Secondary | ICD-10-CM | POA: Diagnosis not present

## 2020-02-01 DIAGNOSIS — Z01818 Encounter for other preprocedural examination: Secondary | ICD-10-CM | POA: Diagnosis not present

## 2020-02-01 DIAGNOSIS — Z794 Long term (current) use of insulin: Secondary | ICD-10-CM | POA: Diagnosis not present

## 2020-02-01 DIAGNOSIS — R0602 Shortness of breath: Secondary | ICD-10-CM | POA: Diagnosis not present

## 2020-02-01 DIAGNOSIS — E119 Type 2 diabetes mellitus without complications: Secondary | ICD-10-CM | POA: Diagnosis not present

## 2020-02-01 DIAGNOSIS — I340 Nonrheumatic mitral (valve) insufficiency: Secondary | ICD-10-CM | POA: Diagnosis not present

## 2020-02-01 DIAGNOSIS — I5022 Chronic systolic (congestive) heart failure: Secondary | ICD-10-CM | POA: Diagnosis not present

## 2020-02-02 ENCOUNTER — Encounter: Payer: Medicare HMO | Attending: Physician Assistant | Admitting: Physician Assistant

## 2020-02-02 ENCOUNTER — Inpatient Hospital Stay: Payer: Medicare HMO

## 2020-02-02 ENCOUNTER — Other Ambulatory Visit: Payer: Self-pay

## 2020-02-02 ENCOUNTER — Inpatient Hospital Stay: Payer: Medicare HMO | Attending: Internal Medicine

## 2020-02-02 ENCOUNTER — Inpatient Hospital Stay (HOSPITAL_BASED_OUTPATIENT_CLINIC_OR_DEPARTMENT_OTHER): Payer: Medicare HMO | Admitting: Internal Medicine

## 2020-02-02 VITALS — BP 113/81 | HR 67 | Temp 96.8°F | Resp 20 | Ht 63.0 in | Wt 197.0 lb

## 2020-02-02 DIAGNOSIS — I130 Hypertensive heart and chronic kidney disease with heart failure and stage 1 through stage 4 chronic kidney disease, or unspecified chronic kidney disease: Secondary | ICD-10-CM | POA: Insufficient documentation

## 2020-02-02 DIAGNOSIS — M25569 Pain in unspecified knee: Secondary | ICD-10-CM | POA: Diagnosis not present

## 2020-02-02 DIAGNOSIS — Z87891 Personal history of nicotine dependence: Secondary | ICD-10-CM | POA: Insufficient documentation

## 2020-02-02 DIAGNOSIS — I110 Hypertensive heart disease with heart failure: Secondary | ICD-10-CM | POA: Diagnosis not present

## 2020-02-02 DIAGNOSIS — Z7189 Other specified counseling: Secondary | ICD-10-CM

## 2020-02-02 DIAGNOSIS — Z170 Estrogen receptor positive status [ER+]: Secondary | ICD-10-CM

## 2020-02-02 DIAGNOSIS — R2689 Other abnormalities of gait and mobility: Secondary | ICD-10-CM | POA: Insufficient documentation

## 2020-02-02 DIAGNOSIS — R5383 Other fatigue: Secondary | ICD-10-CM | POA: Diagnosis not present

## 2020-02-02 DIAGNOSIS — Z7982 Long term (current) use of aspirin: Secondary | ICD-10-CM | POA: Diagnosis not present

## 2020-02-02 DIAGNOSIS — Z79899 Other long term (current) drug therapy: Secondary | ICD-10-CM | POA: Diagnosis not present

## 2020-02-02 DIAGNOSIS — Z8673 Personal history of transient ischemic attack (TIA), and cerebral infarction without residual deficits: Secondary | ICD-10-CM | POA: Insufficient documentation

## 2020-02-02 DIAGNOSIS — Z5111 Encounter for antineoplastic chemotherapy: Secondary | ICD-10-CM | POA: Diagnosis not present

## 2020-02-02 DIAGNOSIS — E1151 Type 2 diabetes mellitus with diabetic peripheral angiopathy without gangrene: Secondary | ICD-10-CM | POA: Insufficient documentation

## 2020-02-02 DIAGNOSIS — S0990XA Unspecified injury of head, initial encounter: Secondary | ICD-10-CM | POA: Diagnosis not present

## 2020-02-02 DIAGNOSIS — E1165 Type 2 diabetes mellitus with hyperglycemia: Secondary | ICD-10-CM | POA: Diagnosis not present

## 2020-02-02 DIAGNOSIS — I5042 Chronic combined systolic (congestive) and diastolic (congestive) heart failure: Secondary | ICD-10-CM | POA: Diagnosis not present

## 2020-02-02 DIAGNOSIS — C50212 Malignant neoplasm of upper-inner quadrant of left female breast: Secondary | ICD-10-CM | POA: Insufficient documentation

## 2020-02-02 DIAGNOSIS — L97511 Non-pressure chronic ulcer of other part of right foot limited to breakdown of skin: Secondary | ICD-10-CM | POA: Insufficient documentation

## 2020-02-02 DIAGNOSIS — D631 Anemia in chronic kidney disease: Secondary | ICD-10-CM | POA: Insufficient documentation

## 2020-02-02 DIAGNOSIS — E1122 Type 2 diabetes mellitus with diabetic chronic kidney disease: Secondary | ICD-10-CM | POA: Insufficient documentation

## 2020-02-02 DIAGNOSIS — N3000 Acute cystitis without hematuria: Secondary | ICD-10-CM | POA: Diagnosis not present

## 2020-02-02 DIAGNOSIS — F418 Other specified anxiety disorders: Secondary | ICD-10-CM | POA: Diagnosis not present

## 2020-02-02 DIAGNOSIS — N184 Chronic kidney disease, stage 4 (severe): Secondary | ICD-10-CM | POA: Diagnosis not present

## 2020-02-02 DIAGNOSIS — C7951 Secondary malignant neoplasm of bone: Secondary | ICD-10-CM | POA: Diagnosis not present

## 2020-02-02 DIAGNOSIS — E11621 Type 2 diabetes mellitus with foot ulcer: Secondary | ICD-10-CM | POA: Diagnosis not present

## 2020-02-02 DIAGNOSIS — I509 Heart failure, unspecified: Secondary | ICD-10-CM | POA: Diagnosis not present

## 2020-02-02 DIAGNOSIS — Z794 Long term (current) use of insulin: Secondary | ICD-10-CM | POA: Insufficient documentation

## 2020-02-02 DIAGNOSIS — R5381 Other malaise: Secondary | ICD-10-CM | POA: Diagnosis not present

## 2020-02-02 DIAGNOSIS — S199XXA Unspecified injury of neck, initial encounter: Secondary | ICD-10-CM | POA: Diagnosis not present

## 2020-02-02 DIAGNOSIS — Z7951 Long term (current) use of inhaled steroids: Secondary | ICD-10-CM | POA: Diagnosis not present

## 2020-02-02 LAB — CBC WITH DIFFERENTIAL/PLATELET
Abs Immature Granulocytes: 0.17 10*3/uL — ABNORMAL HIGH (ref 0.00–0.07)
Basophils Absolute: 0.1 10*3/uL (ref 0.0–0.1)
Basophils Relative: 1 %
Eosinophils Absolute: 0.1 10*3/uL (ref 0.0–0.5)
Eosinophils Relative: 1 %
HCT: 27.9 % — ABNORMAL LOW (ref 36.0–46.0)
Hemoglobin: 9.7 g/dL — ABNORMAL LOW (ref 12.0–15.0)
Immature Granulocytes: 2 %
Lymphocytes Relative: 14 %
Lymphs Abs: 1.2 10*3/uL (ref 0.7–4.0)
MCH: 31.6 pg (ref 26.0–34.0)
MCHC: 34.8 g/dL (ref 30.0–36.0)
MCV: 90.9 fL (ref 80.0–100.0)
Monocytes Absolute: 1.1 10*3/uL — ABNORMAL HIGH (ref 0.1–1.0)
Monocytes Relative: 12 %
Neutro Abs: 6.1 10*3/uL (ref 1.7–7.7)
Neutrophils Relative %: 70 %
Platelets: 444 10*3/uL — ABNORMAL HIGH (ref 150–400)
RBC: 3.07 MIL/uL — ABNORMAL LOW (ref 3.87–5.11)
RDW: 13.2 % (ref 11.5–15.5)
WBC: 8.7 10*3/uL (ref 4.0–10.5)
nRBC: 0 % (ref 0.0–0.2)

## 2020-02-02 LAB — COMPREHENSIVE METABOLIC PANEL
ALT: 14 U/L (ref 0–44)
AST: 14 U/L — ABNORMAL LOW (ref 15–41)
Albumin: 3.4 g/dL — ABNORMAL LOW (ref 3.5–5.0)
Alkaline Phosphatase: 71 U/L (ref 38–126)
Anion gap: 10 (ref 5–15)
BUN: 40 mg/dL — ABNORMAL HIGH (ref 8–23)
CO2: 25 mmol/L (ref 22–32)
Calcium: 8.9 mg/dL (ref 8.9–10.3)
Chloride: 100 mmol/L (ref 98–111)
Creatinine, Ser: 2.67 mg/dL — ABNORMAL HIGH (ref 0.44–1.00)
GFR calc Af Amer: 21 mL/min — ABNORMAL LOW (ref >60)
GFR calc non Af Amer: 18 mL/min — ABNORMAL LOW (ref >60)
Glucose, Bld: 172 mg/dL — ABNORMAL HIGH (ref 70–99)
Potassium: 3.8 mmol/L (ref 3.5–5.1)
Sodium: 135 mmol/L (ref 135–145)
Total Bilirubin: 0.5 mg/dL (ref 0.3–1.2)
Total Protein: 7.6 g/dL (ref 6.5–8.1)

## 2020-02-02 MED ORDER — SODIUM CHLORIDE 0.9 % IV SOLN
2.0000 mg | Freq: Once | INTRAVENOUS | Status: AC
  Administered 2020-02-02: 2 mg via INTRAVENOUS
  Filled 2020-02-02: qty 4

## 2020-02-02 MED ORDER — HEPARIN SOD (PORK) LOCK FLUSH 100 UNIT/ML IV SOLN
500.0000 [IU] | Freq: Once | INTRAVENOUS | Status: AC
  Administered 2020-02-02: 500 [IU] via INTRAVENOUS
  Filled 2020-02-02: qty 5

## 2020-02-02 MED ORDER — HEPARIN SOD (PORK) LOCK FLUSH 100 UNIT/ML IV SOLN
INTRAVENOUS | Status: AC
  Filled 2020-02-02: qty 5

## 2020-02-02 MED ORDER — SODIUM CHLORIDE 0.9% FLUSH
10.0000 mL | INTRAVENOUS | Status: DC | PRN
  Administered 2020-02-02: 10 mL via INTRAVENOUS
  Filled 2020-02-02: qty 10

## 2020-02-02 MED ORDER — PROCHLORPERAZINE MALEATE 10 MG PO TABS
10.0000 mg | ORAL_TABLET | Freq: Once | ORAL | Status: AC
  Administered 2020-02-02: 10 mg via ORAL
  Filled 2020-02-02: qty 1

## 2020-02-02 MED ORDER — SODIUM CHLORIDE 0.9 % IV SOLN
Freq: Once | INTRAVENOUS | Status: AC
  Filled 2020-02-02: qty 250

## 2020-02-02 NOTE — Progress Notes (Addendum)
TAWONA, FILSINGER ([id number]) Visit Report for 02/02/2020 Chief Complaint Document Details Patient Name: [name], [name] Date of Service: 02/02/2020 3:45 PM Medical Record Number: [id number] Patient Account Number: [id number] Date of Birth/Sex: 28-Nov-1955 (64 y.o. F) Treating RN: Army Melia Primary Care Provider: [name] Other Clinician: Referring Provider: [name] Treating Provider/Extender: Melburn Hake, Siarra Gilkerson Weeks in Treatment: 5 Information Obtained from: Patient Chief Complaint Right foot ulcer Electronic Signature(s) Signed: 02/02/2020 3:32:50 PM By: Worthy Keeler PA-C Entered By: Worthy Keeler on 02/02/2020 15:32:49 Seymore, [name] ([id number]) -------------------------------------------------------------------------------- HPI Details Patient Name: [name] Date of Service: 02/02/2020 3:45 PM Medical Record Number: [id number] Patient Account Number: [id number] Date of Birth/Sex: 1955-09-10 (64 y.o. F) Treating RN: Army Melia Primary Care Provider: [name] Other Clinician: Referring Provider: [name] Treating Provider/Extender: Melburn Hake, Granger Chui Weeks in Treatment: 5 History of Present Illness HPI Description: 12/27/2019 upon evaluation today patient appears to be doing somewhat poorly upon initial inspection here in the clinic. She has unfortunately been having issues with for the past week [name] blister over her right heel that started on the plantar aspect and has spread to the medial aspect. She does have [name] history of diabetes mellitus type 2 she also has hypertension along with congestive heart failure. With that being said she was supposed to be having knee surgery on her right knee but this was postponed by the surgeon and she was referred to [place] due to the blister noted. Fortunately there is no signs of active infection at this time. With that being said I am concerned about the fact that this could develop into infection. Obviously we  want to prevent such from happening. She does note that she been placed on [name] antibiotic for urinary tract infection by her primary care provider she has that to pick up. I asked her to call and let us know what that antibiotic was so that we can put that in the chart. For that reason I am not can give her anything empirically at this point. The patient cannot remember any injury that she had to this region in fact she really does not know how this began at all other than the fact that it "just showed up". 01/05/2020 upon evaluation today patient appears to be doing about the same in regard to her heel ulcer. She has been tolerating the dressing changes without complication. Fortunately there is no signs of active infection at this time. No fevers, chills, nausea, vomiting, or diarrhea. With that being said I do believe the skin is getting need to be removed from the heel where this is somewhat deflated there is still [name] lot of blood collecting underneath and I think this is not can I do her any good to be perfectly honest. Plus we do not really know exactly what everything looks like underneath as far as the actual wound is concerned. Obviously we need to figure that out. 01/12/2020 upon evaluation today patient's wound actually appears to be doing excellent in fact this is very close to complete resolution. Subsequently I also did send [name] culture after removing the blood blister last week and the patient did not have any bacteria noted just normal skin flora and no growth otherwise after 2 days. Fortunately there is no signs of active infection at this time systemically or locally. Overall I feel that she is doing excellent. 01/19/2020 upon evaluation today patient appears to be doing excellent at this time in regard to her heel ulcer  for the most part. The one issue that we do see is that she is having [name] new blistered area that amount to clear away some of the skin from today. This seems to be due to her  foot slipping in the offloading shoe that she currently is utilizing. Fortunately there is no signs of active infection at this time. No fevers, chills, nausea, vomiting, or diarrhea. 01/26/2020 upon evaluation today patient's wound actually showed signs of excellent improvement. She has made great progress even since last week's visit. I do believe that she continues to tolerate the dressing changes without any complication and I am very pleased with that as well. In general I think that we will likely continue with the current measures as long as we are continuing to see the results that we are at this point. 02/02/2020 upon evaluation today patient appears to be doing excellent in regard to her wounds currently. She has been tolerating the dressing changes without complication in fact this appears to be completely healed today based on what I am seeing. Electronic Signature(s) Signed: 02/02/2020 3:53:54 PM By: Worthy Keeler PA-C Entered By: Worthy Keeler on 02/02/2020 15:53:54 [place], [name] ([id number]) -------------------------------------------------------------------------------- Physical Exam Details Patient Name: [name] Date of Service: 02/02/2020 3:45 PM Medical Record Number: [id number] Patient Account Number: [id number] Date of Birth/Sex: 1956/04/27 (64 y.o. F) Treating RN: Army Melia Primary Care Provider: [name] Other Clinician: Referring Provider: [name] Treating Provider/Extender: STONE III, Zamyiah Tino Weeks in Treatment: 5 Constitutional Well-nourished and well-hydrated in no acute distress. Respiratory normal breathing without difficulty. Psychiatric this patient is able to make decisions and demonstrates good insight into disease process. Alert and Oriented x 3. pleasant and cooperative. Notes Upon inspection patient's wound bed actually showed signs of good epithelization at this point and fortunately there does not appear to be any evidence  of active infection. Overall I think that she has done extremely well with regard to her heel currently. Electronic Signature(s) Signed: 02/02/2020 3:54:17 PM By: Worthy Keeler PA-C Entered By: Worthy Keeler on 02/02/2020 15:54:16 Formby, [name] ([id number]) -------------------------------------------------------------------------------- Physician Orders Details Patient Name: [name] Date of Service: 02/02/2020 3:45 PM Medical Record Number: [id number] Patient Account Number: [id number] Date of Birth/Sex: 01-11-1956 (64 y.o. F) Treating RN: Army Melia Primary Care Provider: [name] Other Clinician: Referring Provider: [name] Treating Provider/Extender: Melburn Hake, Bertis Hustead Weeks in Treatment: 5 Verbal / Phone Orders: No Diagnosis Coding ICD-10 Coding Code Description E11.621 Type 2 diabetes mellitus with foot ulcer L97.511 Non-pressure chronic ulcer of other part of right foot limited to breakdown of skin I10 Essential (primary) hypertension I50.42 Chronic combined systolic (congestive) and diastolic (congestive) heart failure Discharge From [facility] o Discharge from [place] complete Electronic Signature(s) Signed: 02/02/2020 3:55:13 PM By: Army Melia Signed: 02/02/2020 4:07:16 PM By: Worthy Keeler PA-C Entered By: Army Melia on 02/02/2020 15:51:07 Klug, Shandale AMarland Kitchen ([id number]) -------------------------------------------------------------------------------- Problem List Details Patient Name: [name] Date of Service: 02/02/2020 3:45 PM Medical Record Number: [id number] Patient Account Number: [id number] Date of Birth/Sex: May 17, 1956 (64 y.o. F) Treating RN: Army Melia Primary Care Provider: [name] Other Clinician: Referring Provider: [name] Treating Provider/Extender: Melburn Hake, Shykeem Resurreccion Weeks in Treatment: 5 Active Problems ICD-10 Encounter Code Description Active Date MDM Diagnosis E11.621  Type 2 diabetes mellitus with foot ulcer 12/27/2019 No Yes L97.511 Non-pressure chronic ulcer of other part of right foot limited to 12/27/2019 No Yes breakdown of skin  I10 Essential (primary) hypertension 12/27/2019 No Yes I50.42 Chronic combined systolic (congestive) and diastolic (congestive) heart 12/27/2019 No Yes failure Inactive Problems Resolved Problems Electronic Signature(s) Signed: 02/02/2020 3:32:44 PM By: Worthy Keeler PA-C Entered By: Worthy Keeler on 02/02/2020 15:32:44 Kleckley, Jeanet AMarland Kitchen ([id number]) -------------------------------------------------------------------------------- Progress Note Details Patient Name: [name] Date of Service: 02/02/2020 3:45 PM Medical Record Number: [id number] Patient Account Number: [id number] Date of Birth/Sex: 1956/05/16 (64 y.o. F) Treating RN: Army Melia Primary Care Provider: [name] Other Clinician: Referring Provider: [name] Treating Provider/Extender: Melburn Hake, Mirranda Monrroy Weeks in Treatment: 5 Subjective Chief Complaint Information obtained from Patient Right foot ulcer History of Present Illness (HPI) 12/27/2019 upon evaluation today patient appears to be doing somewhat poorly upon initial inspection here in the clinic. She has unfortunately been having issues with for the past week [name] blister over her right heel that started on the plantar aspect and has spread to the medial aspect. She does have [name] history of diabetes mellitus type 2 she also has hypertension along with congestive heart failure. With that being said she was supposed to be having knee surgery on her right knee but this was postponed by the surgeon and she was referred to [place] due to the blister noted. Fortunately there is no signs of active infection at this time. With that being said I am concerned about the fact that this could develop into infection. Obviously we want to prevent such from happening. She does note that she been placed on [name]  antibiotic for urinary tract infection by her primary care provider she has that to pick up. I asked her to call and let us know what that antibiotic was so that we can put that in the chart. For that reason I am not can give her anything empirically at this point. The patient cannot remember any injury that she had to this region in fact she really does not know how this began at all other than the fact that it "just showed up". 01/05/2020 upon evaluation today patient appears to be doing about the same in regard to her heel ulcer. She has been tolerating the dressing changes without complication. Fortunately there is no signs of active infection at this time. No fevers, chills, nausea, vomiting, or diarrhea. With that being said I do believe the skin is getting need to be removed from the heel where this is somewhat deflated there is still [name] lot of blood collecting underneath and I think this is not can I do her any good to be perfectly honest. Plus we do not really know exactly what everything looks like underneath as far as the actual wound is concerned. Obviously we need to figure that out. 01/12/2020 upon evaluation today patient's wound actually appears to be doing excellent in fact this is very close to complete resolution. Subsequently I also did send [name] culture after removing the blood blister last week and the patient did not have any bacteria noted just normal skin flora and no growth otherwise after 2 days. Fortunately there is no signs of active infection at this time systemically or locally. Overall I feel that she is doing excellent. 01/19/2020 upon evaluation today patient appears to be doing excellent at this time in regard to her heel ulcer for the most part. The one issue that we do see is that she is having [name] new blistered area that amount to clear away some of the skin from today. This seems to be due to  her foot slipping in the offloading shoe that she currently is utilizing.  Fortunately there is no signs of active infection at this time. No fevers, chills, nausea, vomiting, or diarrhea. 01/26/2020 upon evaluation today patient's wound actually showed signs of excellent improvement. She has made great progress even since last week's visit. I do believe that she continues to tolerate the dressing changes without any complication and I am very pleased with that as well. In general I think that we will likely continue with the current measures as long as we are continuing to see the results that we are at this point. 02/02/2020 upon evaluation today patient appears to be doing excellent in regard to her wounds currently. She has been tolerating the dressing changes without complication in fact this appears to be completely healed today based on what I am seeing. Objective Constitutional Well-nourished and well-hydrated in no acute distress. Vitals Time Taken: 3:41 PM, Height: 62 in, Weight: 195 lbs, BMI: 35.7, Temperature: 98.4 F, Pulse: 73 bpm, Respiratory Rate: 18 breaths/min, Blood Pressure: 120/85 mmHg. Respiratory normal breathing without difficulty. Psychiatric this patient is able to make decisions and demonstrates good insight into disease process. Alert and Oriented x 3. pleasant and cooperative. General Notes: Upon inspection patient's wound bed actually showed signs of good epithelization at this point and fortunately there does not Pamer, Rosealie [name]. ([id number]) appear to be any evidence of active infection. Overall I think that she has done extremely well with regard to her heel currently. Integumentary (Hair, Skin) Wound #1 status is Open. Original cause of wound was Blister. The wound is located on the Right,Medial Foot. The wound measures 0cm length x 0cm width x 0cm depth; 0cm^2 area and 0cm^3 volume. There is [name] none present amount of drainage noted. The wound margin is flat and intact. There is no granulation within the wound bed. There is no necrotic  tissue within the wound bed. Assessment Active Problems ICD-10 Type 2 diabetes mellitus with foot ulcer Non-pressure chronic ulcer of other part of right foot limited to breakdown of skin Essential (primary) hypertension Chronic combined systolic (congestive) and diastolic (congestive) heart failure Plan Discharge From [facility] Services: Discharge from [place] complete 1. I would recommend currently that we discontinue wound care services as the patient is completely healed and appears to be doing excellent. 2. I would recommend the patient protect the heel for the next week at least. I really think 2 weeks of continuing to baby it would be best. I would recommend that in regard to anything like pedicures I would hold off for at least [name] month maybe even [name] little bit longer. We will see her back for follow-up visit as needed. Electronic Signature(s) Signed: 02/02/2020 3:55:23 PM By: Worthy Keeler PA-C Entered By: Worthy Keeler on 02/02/2020 15:55:23 Ausley, [name] ([id number]) -------------------------------------------------------------------------------- SuperBill Details Patient Name: [name] Date of Service: 02/02/2020 Medical Record Number: [id number] Patient Account Number: [id number] Date of Birth/Sex: [birth year] (64 y.o. F) Treating RN: Army Melia Primary Care Provider: [name] Other Clinician: Referring Provider: [name] Treating Provider/Extender: Melburn Hake, Dandra Shambaugh Weeks in Treatment: 5 Diagnosis Coding ICD-10 Codes Code Description E11.621 Type 2 diabetes mellitus with foot ulcer L97.511 Non-pressure chronic ulcer of other part of right foot limited to breakdown of skin I10 Essential (primary) hypertension I50.42 Chronic combined systolic (congestive) and diastolic (congestive) heart failure Facility Procedures CPT4 Code: [id number] Description: 73220 - WOUND CARE VISIT-LEV 3 EST PT Modifier: Quantity: 1 Physician  Procedures CPT4 Code: 9359409 Description: 05025 - WC PHYS LEVEL 2 - EST PT Modifier: Quantity: 1 CPT4 Code: Description: ICD-10 Diagnosis Description E11.621 Type 2 diabetes mellitus with foot ulcer L97.511 Non-pressure chronic ulcer of other part of right foot limited to break I10 Essential (primary) hypertension I50.42 Chronic combined systolic (congestive)  and diastolic (congestive) heart Modifier: down of skin failure Quantity: Electronic Signature(s) Signed: 02/02/2020 3:55:40 PM By: Worthy Keeler PA-C Entered By: Worthy Keeler on 02/02/2020 15:55:40

## 2020-02-02 NOTE — Progress Notes (Addendum)
[name], [name] ([id number]) Visit Report for 02/02/2020 Arrival Information Details Patient Name: [name], [name] Date of Service: 02/02/2020 3:45 PM Medical Record Number: [id number] Patient Account Number: [id number] Date of Birth/Sex: 17-Jun-1956 (64 y.o. F) Treating RN: Army Melia Primary Care Lyncoln Maskell: [name] Other Clinician: Referring Lorely Bubb: [name] Treating Asuka Dusseau/Extender: Melburn Hake, HOYT Weeks in Treatment: 5 Visit Information History Since Last Visit Added or deleted any medications: No Patient Arrived: Ambulatory Any new allergies or adverse reactions: No Arrival Time: 15:39 Had a fall or experienced change in No Accompanied By: husband activities of daily living that may affect Transfer Assistance: None risk of falls: Patient Identification Verified: Yes Signs or symptoms of abuse/neglect since last visito No Secondary Verification Process Completed: Yes Hospitalized since last visit: No Patient Has Alerts: Yes Implantable device outside of the clinic excluding No Patient Alerts: DMII cellular tissue based products placed in the center since last visit: Has Dressing in Place as Prescribed: Yes Pain Present Now: No Electronic Signature(s) Signed: 02/02/2020 3:50:28 PM By: Sandre Kitty Entered By: Sandre Kitty on 02/02/2020 15:40:39 [name], [name] Kitchen ([id number]) -------------------------------------------------------------------------------- Clinic Level of Care Assessment Details Patient Name: Gloria Both A. Date of Service: 02/02/2020 3:45 PM Medical Record Number: [id number] Patient Account Number: [id number] Date of Birth/Sex: [birth year] (64 y.o. F) Treating RN: Army Melia Primary Care Mccade Sullenberger: [name] Other Clinician: Referring Tanyiah Laurich: [name] Treating Kemper Heupel/Extender: Melburn Hake, HOYT Weeks in Treatment: 5 Clinic Level of Care Assessment Items TOOL 4 Quantity Score []  - Use when only an EandM is  performed on FOLLOW-UP visit 0 ASSESSMENTS - Nursing Assessment / Reassessment X - Reassessment of Co-morbidities (includes updates in patient status) 1 10 X- 1 5 Reassessment of Adherence to Treatment Plan ASSESSMENTS - Wound and Skin Assessment / Reassessment X - Simple Wound Assessment / Reassessment - one wound 1 5 []  - 0 Complex Wound Assessment / Reassessment - multiple wounds []  - 0 Dermatologic / Skin Assessment (not related to wound area) ASSESSMENTS - Focused Assessment []  - Circumferential Edema Measurements - multi extremities 0 []  - 0 Nutritional Assessment / Counseling / Intervention X- 1 5 Lower Extremity Assessment (monofilament, tuning fork, pulses) []  - 0 Peripheral Arterial Disease Assessment (using hand held doppler) ASSESSMENTS - Ostomy and/or Continence Assessment and Care []  - Incontinence Assessment and Management 0 []  - 0 Ostomy Care Assessment and Management (repouching, etc.) PROCESS - Coordination of Care X - Simple Patient / Family Education for ongoing care 1 15 []  - 0 Complex (extensive) Patient / Family Education for ongoing care X- 1 10 Staff obtains Programmer, systems, Records, Test Results / Process Orders []  - 0 Staff telephones HHA, Nursing Homes / Clarify orders / etc []  - 0 Routine Transfer to another Facility (non-emergent condition) []  - 0 Routine Hospital Admission (non-emergent condition) []  - 0 New Admissions / Biomedical engineer / Ordering NPWT, Apligraf, etc. []  - 0 Emergency Hospital Admission (emergent condition) X- 1 10 Simple Discharge Coordination []  - 0 Complex (extensive) Discharge Coordination PROCESS - Special Needs []  - Pediatric / Minor Patient Management 0 []  - 0 Isolation Patient Management []  - 0 Hearing / Language / Visual special needs []  - 0 Assessment of Community assistance (transportation, D/C planning, etc.) []  - 0 Additional assistance / Altered mentation []  - 0 Support Surface(s) Assessment (bed,  cushion, seat, etc.) INTERVENTIONS - Wound Cleansing / Measurement [name], [name] ([id number]) X- 1 5 Simple Wound Cleansing - one wound []  - 0 Complex Wound Cleansing - multiple  wounds X- 1 5 Wound Imaging (photographs - any number of wounds) []  - 0 Wound Tracing (instead of photographs) X- 1 5 Simple Wound Measurement - one wound []  - 0 Complex Wound Measurement - multiple wounds INTERVENTIONS - Wound Dressings []  - Small Wound Dressing one or multiple wounds 0 []  - 0 Medium Wound Dressing one or multiple wounds []  - 0 Large Wound Dressing one or multiple wounds []  - 0 Application of Medications - topical []  - 0 Application of Medications - injection INTERVENTIONS - Miscellaneous []  - External ear exam 0 []  - 0 Specimen Collection (cultures, biopsies, blood, body fluids, etc.) []  - 0 Specimen(s) / Culture(s) sent or taken to Lab for analysis []  - 0 Patient Transfer (multiple staff / Civil Service fast streamer / Similar devices) []  - 0 Simple Staple / Suture removal (25 or less) []  - 0 Complex Staple / Suture removal (26 or more) []  - 0 Hypo / Hyperglycemic Management (close monitor of Blood Glucose) []  - 0 Ankle / Brachial Index (ABI) - do not check if billed separately X- 1 5 Vital Signs Has the patient been seen at the hospital within the last three years: Yes Total Score: 80 Level Of Care: New/Established - Level 3 Electronic Signature(s) Signed: 02/02/2020 3:55:13 PM By: Army Melia Entered By: Army Melia on 02/02/2020 15:51:29 [name], [name] ([id number]) -------------------------------------------------------------------------------- Encounter Discharge Information Details Patient Name: Gloria Both A. Date of Service: 02/02/2020 3:45 PM Medical Record Number: [id number] Patient Account Number: [id number] Date of Birth/Sex: 07-18-1956 (64 y.o. F) Treating RN: Army Melia Primary Care Jontavious Commons: [name] Other Clinician: Referring Karena Kinker: [name] Treating Jen Benedict/Extender: Melburn Hake, HOYT Weeks in Treatment: 5 Encounter Discharge Information Items Discharge Condition: Stable Ambulatory Status: Wheelchair Discharge Destination: Home Transportation: Private Auto Accompanied By: family Schedule Follow-up Appointment: Yes Clinical Summary of Care: Electronic Signature(s) Signed: 02/02/2020 3:55:13 PM By: Army Melia Entered By: Army Melia on 02/02/2020 15:51:57 Vernet, [name] Kitchen ([id number]) -------------------------------------------------------------------------------- Lower Extremity Assessment Details Patient Name: Gloria Both A. Date of Service: 02/02/2020 3:45 PM Medical Record Number: [id number] Patient Account Number: [id number] Date of Birth/Sex: 10-24-1955 (64 y.o. F) Treating RN: Army Melia Primary Care Jamine Highfill: [name] Other Clinician: Referring Hellon Vaccarella: [name] Treating Chandra Asher/Extender: STONE III, HOYT Weeks in Treatment: 5 Edema Assessment Assessed: [Left: No] [Right: No] Edema: [Left: N] [Right: o] Vascular Assessment Pulses: Dorsalis Pedis Palpable: [Right:Yes] Electronic Signature(s) Signed: 02/02/2020 3:55:13 PM By: Army Melia Entered By: Army Melia on 02/02/2020 15:50:15 Kuchenbecker, [name] ([id number]) -------------------------------------------------------------------------------- Multi Wound Chart Details Patient Name: Gloria Both A. Date of Service: 02/02/2020 3:45 PM Medical Record Number: [id number] Patient Account Number: [id number] Date of Birth/Sex: 1955-11-25 (64 y.o. F) Treating RN: Army Melia Primary Care Alexias Margerum: [name] Other Clinician: Referring Anacristina Steffek: [name] Treating Nimesh Riolo/Extender: Melburn Hake, HOYT Weeks in Treatment: 5 Vital Signs Height(in): 62 Pulse(bpm): 33 Weight(lbs): 195 Blood Pressure(mmHg): 120/85 Body Mass Index(BMI): 36 Temperature(F): 98.4 Respiratory Rate(breaths/min): 18 Photos: [N/A:N/A] Wound  Location: Right, Medial Foot N/A N/A Wounding Event: Blister N/A N/A Primary Etiology: Diabetic Wound/Ulcer of the Lower N/A N/A Extremity Comorbid History: Cataracts, Asthma, Congestive N/A N/A Heart Failure, Hypertension, Type II Diabetes, Received Chemotherapy, Received Radiation Date Acquired: 12/20/2019 N/A N/A Weeks of Treatment: 5 N/A N/A Wound Status: Open N/A N/A Measurements L x W x D (cm) 0x0x0 N/A N/A Area (cm) : 0 N/A N/A Volume (cm) : 0 N/A N/A % Reduction in Area: 100.00% N/A N/A % Reduction in Volume: 100.00% N/A N/A Classification: Grade  2 N/A N/A Exudate Amount: None Present N/A N/A Wound Margin: Flat and Intact N/A N/A Granulation Amount: None Present (0%) N/A N/A Necrotic Amount: None Present (0%) N/A N/A Exposed Structures: Fascia: No N/A N/A Fat Layer (Subcutaneous Tissue) Exposed: No Tendon: No Muscle: No Joint: No Bone: No Epithelialization: Large (67-100%) N/A N/A Treatment Notes Electronic Signature(s) Signed: 02/02/2020 3:55:13 PM By: Army Melia Entered By: Army Melia on 02/02/2020 15:50:43 Jelley, [name] ([id number]) -------------------------------------------------------------------------------- Multi-Disciplinary Care Plan Details Patient Name: Gloria Both A. Date of Service: 02/02/2020 3:45 PM Medical Record Number: [id number] Patient Account Number: [id number] Date of Birth/Sex: Jul 10, 1956 (64 y.o. F) Treating RN: Army Melia Primary Care Thomas Mabry: [name] Other Clinician: Referring Cathey Fredenburg: [name] Treating Keymoni Mccaster/Extender: Melburn Hake, HOYT Weeks in Treatment: 5 Active Inactive Electronic Signature(s) Signed: 02/02/2020 3:55:13 PM By: Army Melia Entered By: Army Melia on 02/02/2020 15:50:35 Murfin, [name] Kitchen ([id number]) -------------------------------------------------------------------------------- Pain Assessment Details Patient Name: Gloria Both A. Date of Service: 02/02/2020 3:45 PM Medical Record  Number: [id number] Patient Account Number: [id number] Date of Birth/Sex: 1955-08-15 (64 y.o. F) Treating RN: Army Melia Primary Care Kayla Deshaies: [name] Other Clinician: Referring Tahira Olivarez: [name] Treating Jola Critzer/Extender: Melburn Hake, HOYT Weeks in Treatment: 5 Active Problems Location of Pain Severity and Description of Pain Patient Has Paino No Site Locations Pain Management and Medication Current Pain Management: Electronic Signature(s) Signed: 02/02/2020 3:50:28 PM By: Sandre Kitty Signed: 02/02/2020 3:55:13 PM By: Army Melia Entered By: Sandre Kitty on 02/02/2020 15:41:49 [name], [name] ([id number]) -------------------------------------------------------------------------------- Patient/Caregiver Education Details Patient Name: Gloria Both A. Date of Service: 02/02/2020 3:45 PM Medical Record Number: [id number] Patient Account Number: [id number] Date of Birth/Gender: 12/19/1955 (64 y.o. F) Treating RN: Army Melia Primary Care Physician: [name] Other Clinician: Referring Physician: [name] Treating Physician/Extender: [name] in Treatment: 5 Education Assessment Education Provided To: Patient Education Topics Provided Wound/Skin Impairment: Handouts: Caring for Your Ulcer Methods: Demonstration, Explain/Verbal Responses: State content correctly Electronic Signature(s) Signed: 02/02/2020 3:55:13 PM By: Army Melia Entered By: Army Melia on 02/02/2020 15:51:39 Kistler, [name] ([id number]) -------------------------------------------------------------------------------- Wound Assessment Details Patient Name: Gloria Both A. Date of Service: 02/02/2020 3:45 PM Medical Record Number: [id number] Patient Account Number: [id number] Date of Birth/Sex: [birth year] (64 y.o. F) Treating RN: Army Melia Primary Care Breella Vanostrand: [name] Other Clinician: Referring Deaisa Merida: [name] Treating  [name]/Extender: Melburn Hake, HOYT Weeks in Treatment: 5 Wound Status Wound Number: 1 Primary Diabetic Wound/Ulcer of the Lower Extremity Etiology: Wound Location: Right, Medial Foot Wound Open Wounding Event: Blister Status: Date Acquired: 12/20/2019 Comorbid Cataracts, Asthma, Congestive Heart Failure, Weeks Of Treatment: 5 History: Hypertension, Type II Diabetes, Received Chemotherapy, Clustered Wound: No Received Radiation Photos Wound Measurements Length: (cm) 0 Width: (cm) 0 Depth: (cm) 0 Area: (cm) 0 Volume: (cm) 0 % Reduction in Area: 100% % Reduction in Volume: 100% Epithelialization: Large (67-100%) Wound Description Classification: Grade 2 Wound Margin: Flat and Intact Exudate Amount: None Present Foul Odor After Cleansing: No Slough/Fibrino Yes Wound Bed Granulation Amount: None Present (0%) Exposed Structure Necrotic Amount: None Present (0%) Fascia Exposed: No Fat Layer (Subcutaneous Tissue) Exposed: No Tendon Exposed: No Muscle Exposed: No Joint Exposed: No Bone Exposed: No Electronic Signature(s) Signed: 02/02/2020 3:55:13 PM By: Army Melia Entered By: Army Melia on 02/02/2020 15:49:59 [name], [name] Kitchen ([id number]) -------------------------------------------------------------------------------- Vitals Details Patient Name: Gloria Both A. Date of Service: 02/02/2020 3:45 PM Medical Record Number: [id number] Patient Account Number: [id number] Date of Birth/Sex: 1956/05/18 (64 y.o. F) Treating RN: Army Melia  Primary Care Chad Tiznado: [name] Other Clinician: Referring Taison Celani: [name] Treating Coriann Brouhard/Extender: Melburn Hake, HOYT Weeks in Treatment: 5 Vital Signs Time Taken: 15:41 Temperature (F): 98.4 Height (in): 62 Pulse (bpm): 73 Weight (lbs): 195 Respiratory Rate (breaths/min): 18 Body Mass Index (BMI): 35.7 Blood Pressure (mmHg): 120/85 Reference Range: 80 - 120 mg / dl Electronic Signature(s) Signed: 02/02/2020  3:50:28 PM By: Sandre Kitty Entered By: Sandre Kitty on 02/02/2020 15:41:31

## 2020-02-02 NOTE — Assessment & Plan Note (Addendum)
Left breast cancer- stage IV- ER/PR +, HER-2/neu. Currently on eribulin [renally dosed [start at 1 mg/m] started on-04/29/2019.   PET scan MAY 10th, 2021-STABLE; Partial response/ uptake pleural-based lesion/lung lesions/left breast mass; but Ca-27-29- Rising. [discussed with pt]; stable.  #Proceed with eribulin today; Labs today reviewed;  acceptable for treatment today.   # Right foot infection/diabetic- [s/p wound clinic evaluation]; STABLE.  # Bone mets-sclerotic; Right acetabular uptake-s/p radiation. Hypocalcemia-ca-8.3/low vitD- on vit D daily. 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   # Chronic kidney disease - stage IV-GFR 18; STABLE; [[name]  # Anemia- hemoglobin today-9/CKD-STABLE; On PO iron.  # DISPOSITION:  #  chemo today # 1 week- labs- cbc/bmp; Eribulin # Follow up in 3 weeks; /MD-labs- cbc/cmp-ca-27-29; Eribulin- Dr.B

## 2020-02-02 NOTE — Progress Notes (Signed)
[facility] OFFICE PROGRESS NOTE  Patient Care Team: [name], [name], MD as Medical Oncologist (Medical Oncology) Corey Skains, MD as Consulting Physician (Cardiology)  Cancer Staging No matching staging information was found for the patient.   Oncology History Overview Note  # OCT 2015-STAGE IV LEFT BREAST T2N1 [T=4cm; [name]; [name]; [name]; EBUS- Positive Paratrac/subcarinal LN s/p ? Taxotere [in [place]; [name]; [name];[name]; [name]; suv 3]; [name]; [name]; [name];  [name]; Ant R iliac bone; [name]; [name]; START-Taxol weekly. March 2020- Taxol every 2 weeks [PN]; SEP 2020- PET progression  # SEP 04/29/2019- ERIBULIN s/p RT - Right hip- [s/p RT- NOV 2020]  # Poorly controlled Blood sugars- improved.   # Pancreatitis Hx/PEI- on creon in past / CKD IV [creat ~ 3-4; [name]; [name]; mild left sided weakness]  # Jan 2020-  Lobular lesion on tongue- s/p excision pyogenic granuloma [Dr.McQueen]   # GENETIC TESTING/COUNSELLING: HETEROZYGOUS Cystic Fibrosis Gene [explains hx of recurrent pancreatitis]  # MOLECULAR TESTING: NA   # PALLIATIVE CARE: 1/22-Discussed/Declined ------------------------------------------------   DIAGNOSIS: [ 2015] BREAST CA; [name]; [name] ;GOALS: Palliative  CURRENT/MOST RECENT THERAPY: ERIBULIN [C].     Carcinoma of upper-inner quadrant of left  breast in female, estrogen receptor positive ([place])  04/29/2019 -  Chemotherapy   The patient had eriBULin mesylate (HALAVEN) 2 mg in sodium chloride 0.9 % 100 mL chemo infusion, 2 mg, Intravenous,  Once, 11 of 14 cycles Dose modification: 1 mg/m2 (original dose 1 mg/m2, Cycle 1, Reason: Provider Judgment) Administration: 2 mg (06/03/2019), 2 mg (04/29/2019), 2 mg (06/10/2019), 2 mg (07/04/2019), 2 mg (07/11/2019), 2 mg (07/25/2019), 2 mg (08/03/2019), 2 mg (08/19/2019), 2 mg (08/26/2019), 2 mg (09/09/2019), 2 mg (09/16/2019), 2 mg (10/07/2019), 2 mg (10/14/2019), 2 mg (10/28/2019), 2 mg (11/04/2019), 2 mg (11/28/2019), 2 mg (12/09/2019), 2 mg (01/13/2020), 2 mg (01/20/2020), 2 mg (02/02/2020)  for chemotherapy treatment.     INTERVAL HISTORY:  [name] 64 y.o.  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  Patient is currently awaiting cardiac clearance for her arthroscopic surgery.  She denies any worsening infectious issues of her foot "blood blister".  Currently not on antibiotics.  No nausea no vomiting no fevers or chills.  Review of Systems  Constitutional: Positive for malaise/fatigue. Negative for chills, diaphoresis, [name], hemoptysis, sputum production, [name], palpitations and orthopnea.  Gastrointestinal: Negative for abdominal pain, blood in stool, constipation, diarrhea, heartburn, melena, nausea and vomiting.  Genitourinary: Negative for dysuria, frequency and urgency.  Musculoskeletal: Positive for back pain and joint pain.  Skin: Negative.  Negative for itching and rash.  Neurological: Positive for tingling. Negative for  dizziness, focal weakness, weakness and headaches.   Endo/Heme/Allergies: Does not bruise/bleed easily.  Psychiatric/Behavioral: Negative for depression. The patient is not nervous/anxious and does not have insomnia.     PAST MEDICAL HISTORY :  Past Medical History:  Diagnosis Date  . Anemia   . Anxiety   . Asthma   . Cancer ([place]) 03/10/2018   Per [facility] PET order. Carcinoma of upper-inner quadrant of left breast in female, estrogen receptor positive .  Marland Kitchen Cancer (HCC)    LUNG  . CHF (congestive heart failure) ([place]) 1997  . CKD (chronic kidney disease)   . Depression   . Diabetes mellitus, type 2 ([place])   . Family history of breast cancer   . Family history of colon cancer   . Family history of ovarian cancer   . Family history of pancreatic cancer   . Family history of prostate cancer   . Family history of stomach cancer   . GERD (gastroesophageal reflux disease)    history of an ulcer  . Hair loss   . History of left breast cancer 05/29/14  . History of partial hysterectomy 12/31/2016   Per patient.  Has not had a period in years.  Had a partial hysterectomy years ago.  Marland Kitchen Hypertension   . Mitral valve regurgitation   . Neuromuscular disorder (HCC)    neuropathies in hand  . Obesity   . Pancreatitis 1997  . Stroke [facility]) 2010   with mild left arm weakness    PAST SURGICAL HISTORY :   Past Surgical History:  Procedure Laterality Date  . CATARACT EXTRACTION W/PHACO Right 02/24/2019   Procedure: CATARACT EXTRACTION PHACO AND INTRAOCULAR LENS PLACEMENT ([place]) RIGHT DIABETES;  Surgeon: Marchia Meiers, MD;  Location: [facility] ORS;  Service: Ophthalmology;  Laterality: Right;  [place] 01:13.0 CDE 7.96 Fluid Pack Lot # [id number]  . CATARACT EXTRACTION W/PHACO Left 03/24/2019   Procedure: CATARACT EXTRACTION PHACO AND INTRAOCULAR LENS PLACEMENT ([facility]) - left diabetic;  Surgeon: Marchia Meiers, MD;  Location: [facility] ORS;  Service: Ophthalmology;  Laterality: Left;  [place]  01:36 CDE 13.93 Fluid pack lot # [id number]  . CESAREAN SECTION    .  CHOLECYSTECTOMY    . EXCISION OF TONGUE LESION N/A 08/17/2018   Procedure: EXCISION OF TONGUE LESION WITH FROZEN SECTIONS;  Surgeon: Beverly Gust, MD;  Location: [facility] ORS;  Service: ENT;  Laterality: N/A;  . EYE SURGERY Right    cataract extraction  . PARTIAL HYSTERECTOMY  12/31/2016   Per patient, she has not had a period in years since she had a partial hysterectomy.  Marland Kitchen PORTA CATH INSERTION    . TUBAL LIGATION      FAMILY HISTORY :   Family History  Problem Relation Age of Onset  . Ovarian cancer Mother 23  . Diabetes Mother   . Hypertension Mother   . COPD Father   . Hypertension Father   . Colon cancer Father 13  . Diabetes Sister   . Breast cancer Sister 10       bilateral  . Diabetes Brother   . Leukemia Maternal Aunt   . Pancreatic cancer Paternal Aunt 82  . Pancreatic cancer Paternal Uncle   . Colon cancer Paternal Uncle   . Stomach cancer Maternal Grandfather 70  . Throat cancer Paternal Grandmother   . Breast cancer Maternal Aunt 80  . Colon cancer Maternal Aunt   . Bone cancer Maternal Aunt   . Breast cancer Paternal Aunt  dx >50  . Prostate cancer Paternal Uncle   . Pancreatic cancer Paternal Uncle   . Throat cancer Paternal Uncle   . Lung cancer Paternal Uncle   . Stomach cancer Paternal Uncle   . Brain cancer Paternal Aunt   . Cancer Cousin        liver, kidney  . Prostate cancer Cousin        meastatic  . Lung cancer Other     SOCIAL HISTORY:   Social History   Tobacco Use  . Smoking status: Former Smoker    Packs/day: 0.50    Years: 1.00    Pack years: 0.50    Types: Cigarettes  . Smokeless tobacco: Never Used  Vaping Use  . Vaping Use: Never used  Substance Use Topics  . Alcohol use: No    Alcohol/week: 0.0 standard drinks  . Drug use: No    ALLERGIES:  is allergic to fish-derived products and sulfamethoxazole-trimethoprim.  MEDICATIONS:  Current Outpatient Medications  Medication Sig Dispense Refill  .  acetaminophen-codeine (TYLENOL #4) 300-60 MG tablet Take 1 tablet by mouth 2 (two) times daily as needed for moderate pain.     Marland Kitchen albuterol (PROAIR HFA) 108 (90 BASE) MCG/ACT inhaler Inhale 2 puffs into the lungs every 6 (six) hours as needed for wheezing or shortness of breath.     Marland Kitchen albuterol (PROVENTIL) (2.5 MG/3ML) 0.083% nebulizer solution Inhale 2.5 mg into the lungs every 6 (six) hours as needed for shortness of breath.     . ALPRAZolam (XANAX) 0.5 MG tablet Take 0.5 mg by mouth 2 (two) times daily as needed for anxiety or sleep.     Marland Kitchen amLODipine (NORVASC) 10 MG tablet Take 10 mg by mouth daily.     Marland Kitchen aspirin EC 81 MG tablet Take 81 mg by mouth daily.     Marland Kitchen atenolol (TENORMIN) 100 MG tablet Take 100 mg by mouth 2 (two) times daily.     . bumetanide (BUMEX) 0.5 MG tablet Take 0.5 mg by mouth 2 (two) times daily.     . calcitRIOL (ROCALTROL) 0.25 MCG capsule Take 0.25 mcg by mouth daily.     . Cinnamon 500 MG capsule Take 500 mg by mouth 2 (two) times daily.     . cloNIDine (CATAPRES) 0.2 MG tablet Take 0.2 mg by mouth 2 (two) times daily.     . diflorasone (PSORCON) 0.05 % ointment Apply 1 application topically as directed.     Marland Kitchen ELDERBERRY PO Take 1 tablet by mouth daily.    . enalapril (VASOTEC) 10 MG tablet Take 20 mg by mouth 2 (two) times a day.     . famotidine (PEPCID) 20 MG tablet Take 20 mg by mouth 2 (two) times daily.     . ferrous sulfate 325 (65 FE) MG tablet Take 325 mg by mouth 2 (two) times daily with a meal.    . FLUoxetine (PROZAC) 20 MG capsule Take 20 mg by mouth 2 (two) times daily.     . Fluticasone Propionate, Inhal, (FLOVENT DISKUS) 100 MCG/BLIST AEPB Inhale 2 puffs into the lungs 2 (two) times daily as needed (respiratory problems).     . gabapentin (NEURONTIN) 100 MG capsule TAKE 1 CAPSULE(100 MG) BY MOUTH AT BEDTIME 30 capsule 6  . glyBURIDE (DIABETA) 5 MG tablet Take 10 mg by mouth 2 (two) times daily with a meal.     . LEVEMIR FLEXTOUCH 100 UNIT/ML Pen Inject 55  Units into the skin daily.     Marland Kitchen  loperamide (IMODIUM A-D) 2 MG capsule Take 2-4 mg by mouth 4 (four) times daily as needed for diarrhea or loose stools.     Marland Kitchen NOVOLOG FLEXPEN 100 UNIT/ML FlexPen Inject 7 Units into the skin 2 (two) times daily.     Marland Kitchen oxyCODONE-acetaminophen (PERCOCET/ROXICET) 5-325 MG tablet Take 1 tablet by mouth daily as needed for severe pain.    . simvastatin (ZOCOR) 20 MG tablet Take 20 mg by mouth every evening.     . vitamin B-12 (CYANOCOBALAMIN) 1000 MCG tablet Take 1,000 mcg by mouth daily.     No current facility-administered medications for this visit.   Facility-Administered Medications Ordered in Other Visits  Medication Dose Route Frequency Provider Last Rate Last Admin  . sodium chloride flush (NS) 0.9 % injection 10 mL  10 mL Intravenous PRN Cammie Sickle, MD   10 mL at 01/30/16 1054    PHYSICAL EXAMINATION: ECOG PERFORMANCE STATUS: 1 - Symptomatic but completely ambulatory  BP 113/81 (Patient Position: Sitting)   Pulse 67   Temp (!) 96.8 F (36 C) (Tympanic)   Resp 20   Ht 5' 3"  (1.6 m)   Wt 197 lb (89.4 kg)   BMI 34.90 kg/m   Filed Weights   02/02/20 0908  Weight: 197 lb (89.4 kg)    Physical Exam Constitutional:      Comments: She is alone.  HENT:     Head: Normocephalic and atraumatic.     Mouth/Throat:     Pharynx: No oropharyngeal exudate.  Eyes:     Pupils: Pupils are equal, round, and reactive to light.  Cardiovascular:     Rate and Rhythm: Normal rate and regular rhythm.  Pulmonary:     Effort: No respiratory distress.     Breath sounds: Normal breath sounds. No wheezing.  Abdominal:     General: Bowel sounds are normal. There is no distension.     Palpations: Abdomen is soft. There is no mass.     Tenderness: There is no abdominal tenderness. There is no guarding or rebound.  Musculoskeletal:        General: No tenderness. Normal range of motion.     Cervical back: Normal range of motion and neck supple.  Skin:     General: Skin is warm.     Comments: Right thumb bandaged-wound healing.  No signs of infection or pus.  Neurological:     Mental Status: She is alert and oriented to person, place, and time.  Psychiatric:        Mood and Affect: Affect normal.     LABORATORY DATA:  I have reviewed the data as listed    Component Value Date/Time   NA 135 02/02/2020 0810   NA 130 (L) 06/06/2014 1102   K 3.8 02/02/2020 0810   K 3.9 06/06/2014 1102   CL 100 02/02/2020 0810   CL 95 (L) 06/06/2014 1102   CO2 25 02/02/2020 0810   CO2 28 06/06/2014 1102   GLUCOSE 172 (H) 02/02/2020 0810   GLUCOSE 349 (H) 06/06/2014 1102   BUN 40 (H) 02/02/2020 0810   BUN 17 06/06/2014 1102   CREATININE 2.67 (H) 02/02/2020 0810   CREATININE 1.63 (H) 06/06/2014 1102   CALCIUM 8.9 02/02/2020 0810   CALCIUM 9.2 06/06/2014 1102   PROT 7.6 02/02/2020 0810   PROT 8.2 06/06/2014 1102   ALBUMIN 3.4 (L) 02/02/2020 0810   ALBUMIN 3.3 (L) 06/06/2014 1102   AST 14 (L) 02/02/2020 0810   AST 7 (  L) 06/06/2014 1102   ALT 14 02/02/2020 0810   ALT 12 (L) 06/06/2014 1102   ALKPHOS 71 02/02/2020 0810   ALKPHOS 74 06/06/2014 1102   BILITOT 0.5 02/02/2020 0810   BILITOT 0.4 06/06/2014 1102   GFRNONAA 18 (L) 02/02/2020 0810   GFRNONAA 35 (L) 06/06/2014 1102   GFRAA 21 (L) 02/02/2020 0810   GFRAA 42 (L) 06/06/2014 1102    No results found for: SPEP, UPEP  Lab Results  Component Value Date   WBC 8.7 02/02/2020   NEUTROABS 6.1 02/02/2020   HGB 9.7 (L) 02/02/2020   HCT 27.9 (L) 02/02/2020   MCV 90.9 02/02/2020   PLT 444 (H) 02/02/2020      Chemistry      Component Value Date/Time   NA 135 02/02/2020 0810   NA 130 (L) 06/06/2014 1102   K 3.8 02/02/2020 0810   K 3.9 06/06/2014 1102   CL 100 02/02/2020 0810   CL 95 (L) 06/06/2014 1102   CO2 25 02/02/2020 0810   CO2 28 06/06/2014 1102   BUN 40 (H) 02/02/2020 0810   BUN 17 06/06/2014 1102   CREATININE 2.67 (H) 02/02/2020 0810   CREATININE 1.63 (H) 06/06/2014 1102       Component Value Date/Time   CALCIUM 8.9 02/02/2020 0810   CALCIUM 9.2 06/06/2014 1102   ALKPHOS 71 02/02/2020 0810   ALKPHOS 74 06/06/2014 1102   AST 14 (L) 02/02/2020 0810   AST 7 (L) 06/06/2014 1102   ALT 14 02/02/2020 0810   ALT 12 (L) 06/06/2014 1102   BILITOT 0.5 02/02/2020 0810   BILITOT 0.4 06/06/2014 1102       RADIOGRAPHIC STUDIES: I have personally reviewed the radiological images as listed and agreed with the findings in the report. No results found.   ASSESSMENT & PLAN:  Carcinoma of upper-inner quadrant of left breast in female, estrogen receptor positive ([place]) Left breast cancer- stage IV- ER/PR +, HER-2/neu. Currently on eribulin [renally dosed [start at 1 mg/m] started on-04/29/2019.   PET scan MAY 10th, 2021-STABLE; Partial response/ uptake pleural-based lesion/lung lesions/left breast mass; but Ca-27-29- Rising. [discussed with pt]; stable.  #Proceed with eribulin today; Labs today reviewed;  acceptable for treatment today.   # Right foot infection/diabetic- [s/p wound clinic evaluation]; STABLE.  # Bone mets-sclerotic; Right acetabular uptake-s/p radiation. Hypocalcemia-ca-8.3/low vitD- on vit D daily. 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   # Chronic kidney disease - stage IV-GFR 18; STABLE; [[name]  # Anemia- hemoglobin today-9/CKD-STABLE; On PO iron.  # DISPOSITION:  #  chemo today # 1 week- labs- cbc/bmp; Eribulin # Follow up in 3 weeks; /MD-labs- cbc/cmp-ca-27-29; Eribulin- Dr.B   Orders Placed This Encounter  Procedures  . CBC with Differential    Standing Status:   Future    Standing Expiration Date:   02/01/2021  . Basic metabolic panel    Standing Status:   Future    Standing Expiration Date:   02/01/2021  . CBC with Differential    Standing Status:   Future    Standing Expiration Date:   02/01/2021  .  Comprehensive metabolic panel    Standing Status:   Future    Standing Expiration Date:   02/01/2021  . Cancer antigen 27.29    Standing Status:   Future    Standing Expiration Date:   02/01/2021   All questions were answered. The patient  knows to call the clinic with any problems, questions or concerns.      Cammie Sickle, MD 02/05/2020 9:14 AM

## 2020-02-03 ENCOUNTER — Ambulatory Visit: Payer: Medicare HMO | Admitting: Oncology

## 2020-02-03 ENCOUNTER — Other Ambulatory Visit: Payer: Medicare HMO

## 2020-02-03 ENCOUNTER — Ambulatory Visit: Payer: Medicare HMO

## 2020-02-03 LAB — CANCER ANTIGEN 27.29: CA 27.29: 142.7 U/mL — ABNORMAL HIGH (ref 0.0–38.6)

## 2020-02-13 ENCOUNTER — Inpatient Hospital Stay: Payer: Medicare HMO

## 2020-02-13 ENCOUNTER — Other Ambulatory Visit: Payer: Self-pay

## 2020-02-13 VITALS — BP 112/71 | HR 69 | Temp 96.5°F | Resp 18 | Wt 196.5 lb

## 2020-02-13 DIAGNOSIS — Z7189 Other specified counseling: Secondary | ICD-10-CM

## 2020-02-13 DIAGNOSIS — Z95828 Presence of other vascular implants and grafts: Secondary | ICD-10-CM

## 2020-02-13 DIAGNOSIS — Z170 Estrogen receptor positive status [ER+]: Secondary | ICD-10-CM

## 2020-02-13 DIAGNOSIS — Z5111 Encounter for antineoplastic chemotherapy: Secondary | ICD-10-CM | POA: Diagnosis not present

## 2020-02-13 DIAGNOSIS — C50212 Malignant neoplasm of upper-inner quadrant of left female breast: Secondary | ICD-10-CM

## 2020-02-13 LAB — COMPREHENSIVE METABOLIC PANEL
ALT: 14 U/L (ref 0–44)
AST: 13 U/L — ABNORMAL LOW (ref 15–41)
Albumin: 3.7 g/dL (ref 3.5–5.0)
Alkaline Phosphatase: 70 U/L (ref 38–126)
Anion gap: 11 (ref 5–15)
BUN: 48 mg/dL — ABNORMAL HIGH (ref 8–23)
CO2: 22 mmol/L (ref 22–32)
Calcium: 8.7 mg/dL — ABNORMAL LOW (ref 8.9–10.3)
Chloride: 101 mmol/L (ref 98–111)
Creatinine, Ser: 3.29 mg/dL — ABNORMAL HIGH (ref 0.44–1.00)
GFR calc Af Amer: 17 mL/min — ABNORMAL LOW (ref >60)
GFR calc non Af Amer: 14 mL/min — ABNORMAL LOW (ref >60)
Glucose, Bld: 168 mg/dL — ABNORMAL HIGH (ref 70–99)
Potassium: 4.3 mmol/L (ref 3.5–5.1)
Sodium: 134 mmol/L — ABNORMAL LOW (ref 135–145)
Total Bilirubin: 0.6 mg/dL (ref 0.3–1.2)
Total Protein: 7.7 g/dL (ref 6.5–8.1)

## 2020-02-13 LAB — CBC WITH DIFFERENTIAL/PLATELET
Abs Immature Granulocytes: 0.11 10*3/uL — ABNORMAL HIGH (ref 0.00–0.07)
Basophils Absolute: 0 10*3/uL (ref 0.0–0.1)
Basophils Relative: 0 %
Eosinophils Absolute: 0 10*3/uL (ref 0.0–0.5)
Eosinophils Relative: 0 %
HCT: 26.2 % — ABNORMAL LOW (ref 36.0–46.0)
Hemoglobin: 9.4 g/dL — ABNORMAL LOW (ref 12.0–15.0)
Immature Granulocytes: 1 %
Lymphocytes Relative: 10 %
Lymphs Abs: 1 10*3/uL (ref 0.7–4.0)
MCH: 31.4 pg (ref 26.0–34.0)
MCHC: 35.9 g/dL (ref 30.0–36.0)
MCV: 87.6 fL (ref 80.0–100.0)
Monocytes Absolute: 0.7 10*3/uL (ref 0.1–1.0)
Monocytes Relative: 8 %
Neutro Abs: 7.8 10*3/uL — ABNORMAL HIGH (ref 1.7–7.7)
Neutrophils Relative %: 81 %
Platelets: 373 10*3/uL (ref 150–400)
RBC: 2.99 MIL/uL — ABNORMAL LOW (ref 3.87–5.11)
RDW: 13.4 % (ref 11.5–15.5)
WBC: 9.7 10*3/uL (ref 4.0–10.5)
nRBC: 0 % (ref 0.0–0.2)

## 2020-02-13 MED ORDER — SODIUM CHLORIDE 0.9 % IV SOLN
2.0000 mg | Freq: Once | INTRAVENOUS | Status: AC
  Administered 2020-02-13: 2 mg via INTRAVENOUS
  Filled 2020-02-13: qty 4

## 2020-02-13 MED ORDER — SODIUM CHLORIDE 0.9 % IV SOLN
Freq: Once | INTRAVENOUS | Status: AC
  Filled 2020-02-13: qty 250

## 2020-02-13 MED ORDER — SODIUM CHLORIDE 0.9% FLUSH
10.0000 mL | INTRAVENOUS | Status: DC | PRN
  Administered 2020-02-13: 10 mL via INTRAVENOUS
  Filled 2020-02-13: qty 10

## 2020-02-13 MED ORDER — PROCHLORPERAZINE MALEATE 10 MG PO TABS
10.0000 mg | ORAL_TABLET | Freq: Once | ORAL | Status: AC
  Administered 2020-02-13: 10 mg via ORAL
  Filled 2020-02-13: qty 1

## 2020-02-13 MED ORDER — HEPARIN SOD (PORK) LOCK FLUSH 100 UNIT/ML IV SOLN
500.0000 [IU] | Freq: Once | INTRAVENOUS | Status: AC | PRN
  Administered 2020-02-13: 500 [IU]
  Filled 2020-02-13: qty 5

## 2020-02-13 MED ORDER — HEPARIN SOD (PORK) LOCK FLUSH 100 UNIT/ML IV SOLN
INTRAVENOUS | Status: AC
  Filled 2020-02-13: qty 5

## 2020-02-14 LAB — CANCER ANTIGEN 27.29: CA 27.29: 130.6 U/mL — ABNORMAL HIGH (ref 0.0–38.6)

## 2020-02-23 ENCOUNTER — Ambulatory Visit: Payer: Medicare HMO | Admitting: Internal Medicine

## 2020-02-23 ENCOUNTER — Other Ambulatory Visit: Payer: Medicare HMO

## 2020-02-23 ENCOUNTER — Ambulatory Visit: Payer: Medicare HMO

## 2020-02-23 ENCOUNTER — Other Ambulatory Visit: Payer: Self-pay

## 2020-02-23 DIAGNOSIS — Z170 Estrogen receptor positive status [ER+]: Secondary | ICD-10-CM

## 2020-02-24 ENCOUNTER — Encounter: Payer: Self-pay | Admitting: Internal Medicine

## 2020-02-24 ENCOUNTER — Inpatient Hospital Stay (HOSPITAL_BASED_OUTPATIENT_CLINIC_OR_DEPARTMENT_OTHER): Payer: Medicare HMO | Admitting: Internal Medicine

## 2020-02-24 ENCOUNTER — Other Ambulatory Visit: Payer: Self-pay

## 2020-02-24 ENCOUNTER — Inpatient Hospital Stay: Payer: Medicare HMO

## 2020-02-24 VITALS — BP 135/87 | HR 73 | Temp 97.4°F | Resp 20 | Wt 188.0 lb

## 2020-02-24 DIAGNOSIS — C50212 Malignant neoplasm of upper-inner quadrant of left female breast: Secondary | ICD-10-CM | POA: Diagnosis not present

## 2020-02-24 DIAGNOSIS — Z7189 Other specified counseling: Secondary | ICD-10-CM

## 2020-02-24 DIAGNOSIS — Z95828 Presence of other vascular implants and grafts: Secondary | ICD-10-CM

## 2020-02-24 DIAGNOSIS — Z170 Estrogen receptor positive status [ER+]: Secondary | ICD-10-CM

## 2020-02-24 DIAGNOSIS — Z5111 Encounter for antineoplastic chemotherapy: Secondary | ICD-10-CM | POA: Diagnosis not present

## 2020-02-24 LAB — CBC WITH DIFFERENTIAL/PLATELET
Abs Immature Granulocytes: 0.11 10*3/uL — ABNORMAL HIGH (ref 0.00–0.07)
Basophils Absolute: 0 10*3/uL (ref 0.0–0.1)
Basophils Relative: 0 %
Eosinophils Absolute: 0.1 10*3/uL (ref 0.0–0.5)
Eosinophils Relative: 1 %
HCT: 29 % — ABNORMAL LOW (ref 36.0–46.0)
Hemoglobin: 10 g/dL — ABNORMAL LOW (ref 12.0–15.0)
Immature Granulocytes: 1 %
Lymphocytes Relative: 9 %
Lymphs Abs: 1 10*3/uL (ref 0.7–4.0)
MCH: 31.2 pg (ref 26.0–34.0)
MCHC: 34.5 g/dL (ref 30.0–36.0)
MCV: 90.3 fL (ref 80.0–100.0)
Monocytes Absolute: 0.9 10*3/uL (ref 0.1–1.0)
Monocytes Relative: 9 %
Neutro Abs: 8.5 10*3/uL — ABNORMAL HIGH (ref 1.7–7.7)
Neutrophils Relative %: 80 %
Platelets: 443 10*3/uL — ABNORMAL HIGH (ref 150–400)
RBC: 3.21 MIL/uL — ABNORMAL LOW (ref 3.87–5.11)
RDW: 13.6 % (ref 11.5–15.5)
WBC: 10.6 10*3/uL — ABNORMAL HIGH (ref 4.0–10.5)
nRBC: 0 % (ref 0.0–0.2)

## 2020-02-24 LAB — COMPREHENSIVE METABOLIC PANEL
ALT: 15 U/L (ref 0–44)
AST: 16 U/L (ref 15–41)
Albumin: 3.7 g/dL (ref 3.5–5.0)
Alkaline Phosphatase: 80 U/L (ref 38–126)
Anion gap: 10 (ref 5–15)
BUN: 36 mg/dL — ABNORMAL HIGH (ref 8–23)
CO2: 23 mmol/L (ref 22–32)
Calcium: 8.9 mg/dL (ref 8.9–10.3)
Chloride: 100 mmol/L (ref 98–111)
Creatinine, Ser: 2.62 mg/dL — ABNORMAL HIGH (ref 0.44–1.00)
GFR calc Af Amer: 22 mL/min — ABNORMAL LOW (ref >60)
GFR calc non Af Amer: 19 mL/min — ABNORMAL LOW (ref >60)
Glucose, Bld: 218 mg/dL — ABNORMAL HIGH (ref 70–99)
Potassium: 4 mmol/L (ref 3.5–5.1)
Sodium: 133 mmol/L — ABNORMAL LOW (ref 135–145)
Total Bilirubin: 0.7 mg/dL (ref 0.3–1.2)
Total Protein: 7.6 g/dL (ref 6.5–8.1)

## 2020-02-24 MED ORDER — HEPARIN SOD (PORK) LOCK FLUSH 100 UNIT/ML IV SOLN
500.0000 [IU] | Freq: Once | INTRAVENOUS | Status: AC | PRN
  Administered 2020-02-24: 500 [IU]
  Filled 2020-02-24: qty 5

## 2020-02-24 MED ORDER — SODIUM CHLORIDE 0.9 % IV SOLN
2.0000 mg | Freq: Once | INTRAVENOUS | Status: AC
  Administered 2020-02-24: 2 mg via INTRAVENOUS
  Filled 2020-02-24: qty 4

## 2020-02-24 MED ORDER — HEPARIN SOD (PORK) LOCK FLUSH 100 UNIT/ML IV SOLN
INTRAVENOUS | Status: AC
  Filled 2020-02-24: qty 5

## 2020-02-24 MED ORDER — SODIUM CHLORIDE 0.9% FLUSH
10.0000 mL | INTRAVENOUS | Status: DC | PRN
  Administered 2020-02-24: 10 mL via INTRAVENOUS
  Filled 2020-02-24: qty 10

## 2020-02-24 MED ORDER — PROCHLORPERAZINE MALEATE 10 MG PO TABS
10.0000 mg | ORAL_TABLET | Freq: Once | ORAL | Status: AC
  Administered 2020-02-24: 10 mg via ORAL
  Filled 2020-02-24: qty 1

## 2020-02-24 MED ORDER — SODIUM CHLORIDE 0.9 % IV SOLN
Freq: Once | INTRAVENOUS | Status: AC
  Filled 2020-02-24: qty 250

## 2020-02-24 NOTE — Progress Notes (Signed)
[place] OFFICE PROGRESS NOTE  Patient Care Team: [name], [name], MD as Medical Oncologist (Medical Oncology) Corey Skains, MD as Consulting Physician (Cardiology)  Cancer Staging No matching staging information was found for the patient.   Oncology History Overview Note  # OCT 2015-STAGE IV LEFT BREAST T2N1 [T=4cm; [name]; [name]; [name]; EBUS- Positive Paratrac/subcarinal LN s/p ? Taxotere [in [place]; [name]; [name];[name]; [name]; suv 3]; [name]; [name]; [name];  [name]; Ant R iliac bone; [name]; [name]; START-Taxol weekly. March 2020- Taxol every 2 weeks [PN]; SEP 2020- PET progression  # SEP 04/29/2019- ERIBULIN s/p RT - Right hip- [s/p RT- NOV 2020]  # Poorly controlled Blood sugars- improved.   # Pancreatitis Hx/PEI- on creon in past / CKD IV [creat ~ 3-4; [name]; [name]; mild left sided weakness]  # Jan 2020-  Lobular lesion on tongue- s/p excision pyogenic granuloma [Dr.McQueen]   # GENETIC TESTING/COUNSELLING: HETEROZYGOUS Cystic Fibrosis Gene [explains hx of recurrent pancreatitis]  # MOLECULAR TESTING: NA   # PALLIATIVE CARE: 1/22-Discussed/Declined ------------------------------------------------   DIAGNOSIS: [ 2015] BREAST CA; [name]; [name] ;GOALS: Palliative  CURRENT/MOST RECENT THERAPY: ERIBULIN [C].     Carcinoma of upper-inner quadrant of left  breast in female, estrogen receptor positive ([place])  04/29/2019 -  Chemotherapy   The patient had eriBULin mesylate ([place]) 2 mg in sodium chloride 0.9 % 100 mL chemo infusion, 2 mg, Intravenous,  Once, 12 of 14 cycles Dose modification: 1 mg/m2 (original dose 1 mg/m2, Cycle 1, Reason: Provider Judgment) Administration: 2 mg (06/03/2019), 2 mg (04/29/2019), 2 mg (06/10/2019), 2 mg (07/04/2019), 2 mg (07/11/2019), 2 mg (07/25/2019), 2 mg (08/03/2019), 2 mg (08/19/2019), 2 mg (08/26/2019), 2 mg (09/09/2019), 2 mg (09/16/2019), 2 mg (10/07/2019), 2 mg (10/14/2019), 2 mg (10/28/2019), 2 mg (11/04/2019), 2 mg (11/28/2019), 2 mg (12/09/2019), 2 mg (01/13/2020), 2 mg (01/20/2020), 2 mg (02/02/2020), 2 mg (02/13/2020), 2 mg (02/24/2020)  for chemotherapy treatment.     INTERVAL HISTORY:  [name] 64 y.o.  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  Patient still awaiting cardiac clearance for her arthroscopic surgery.  In the interim patient has been discharged from wound care clinic-as her lower extremity wound is healed.  No nausea no vomiting no fevers or chills.  Review of Systems  Constitutional: Positive for malaise/fatigue. Negative for chills, diaphoresis, [name], hemoptysis, sputum production, [name], palpitations and orthopnea.  Gastrointestinal: Negative for abdominal pain, blood in stool, constipation, diarrhea, heartburn, melena, nausea and vomiting.  Genitourinary: Negative for dysuria, frequency and urgency.  Musculoskeletal: Positive for back pain and joint pain.  Skin: Negative.  Negative for itching and rash.  Neurological: Positive for tingling. Negative for dizziness, focal weakness,  weakness and headaches.  Endo/Heme/Allergies: Does not bruise/bleed easily.  Psychiatric/Behavioral: Negative for depression. The patient is not nervous/anxious and does not have insomnia.     PAST MEDICAL HISTORY :  Past Medical History:  Diagnosis Date  . Anemia   . Anxiety   . Asthma   . Cancer ([place]) 03/10/2018   Per [facility] PET order. Carcinoma of upper-inner quadrant of left breast in female, estrogen receptor positive .  Marland Kitchen Cancer (HCC)    LUNG  . CHF (congestive heart failure) ([place]) 1997  . CKD (chronic kidney disease)   . Depression   . Diabetes mellitus, type 2 ([place])   . Family history of breast cancer   . Family history of colon cancer   . Family history of ovarian cancer   . Family history of pancreatic cancer   . Family history of prostate cancer   . Family history of stomach cancer   . GERD (gastroesophageal reflux disease)    history of an ulcer  . Hair loss   . History of left breast cancer 05/29/14  . History of partial hysterectomy 12/31/2016   Per patient.  Has not had a period in years.  Had a partial hysterectomy years ago.  Marland Kitchen Hypertension   . Mitral valve regurgitation   . Neuromuscular disorder (HCC)    neuropathies in hand  . Obesity   . Pancreatitis 1997  . Stroke [facility]) 2010   with mild left arm weakness    PAST SURGICAL HISTORY :   Past Surgical History:  Procedure Laterality Date  . CATARACT EXTRACTION W/PHACO Right 02/24/2019   Procedure: CATARACT EXTRACTION PHACO AND INTRAOCULAR LENS PLACEMENT ([place]) RIGHT DIABETES;  Surgeon: Marchia Meiers, MD;  Location: [facility] ORS;  Service: Ophthalmology;  Laterality: Right;  [place] 01:13.0 CDE 7.96 Fluid Pack Lot # [id number]  . CATARACT EXTRACTION W/PHACO Left 03/24/2019   Procedure: CATARACT EXTRACTION PHACO AND INTRAOCULAR LENS PLACEMENT ([facility]) - left diabetic;  Surgeon: Marchia Meiers, MD;  Location: [facility] ORS;  Service: Ophthalmology;  Laterality: Left;  [place]  01:36 CDE 13.93 Fluid pack lot # [id number]  . CESAREAN  SECTION    . CHOLECYSTECTOMY    . EXCISION OF TONGUE LESION N/A 08/17/2018   Procedure: EXCISION OF TONGUE LESION WITH FROZEN SECTIONS;  Surgeon: Beverly Gust, MD;  Location: [facility] ORS;  Service: ENT;  Laterality: N/A;  . EYE SURGERY Right    cataract extraction  . PARTIAL HYSTERECTOMY  12/31/2016   Per patient, she has not had a period in years since she had a partial hysterectomy.  Marland Kitchen PORTA CATH INSERTION    . TUBAL LIGATION      FAMILY HISTORY :   Family History  Problem Relation Age of Onset  . Ovarian cancer Mother 62  . Diabetes Mother   . Hypertension Mother   . COPD Father   . Hypertension Father   . Colon cancer Father 44  . Diabetes Sister   . Breast cancer Sister 57       bilateral  . Diabetes Brother   . Leukemia Maternal Aunt   . Pancreatic cancer Paternal Aunt 33  . Pancreatic cancer Paternal Uncle   . Colon cancer Paternal Uncle   . Stomach cancer Maternal Grandfather 17  . Throat cancer Paternal Grandmother   . Breast cancer Maternal Aunt 80  . Colon cancer Maternal Aunt   . Bone cancer Maternal Aunt   . Breast cancer Paternal Aunt  dx >50  . Prostate cancer Paternal Uncle   . Pancreatic cancer Paternal Uncle   . Throat cancer Paternal Uncle   . Lung cancer Paternal Uncle   . Stomach cancer Paternal Uncle   . Brain cancer Paternal Aunt   . Cancer Cousin        liver, kidney  . Prostate cancer Cousin        meastatic  . Lung cancer Other     SOCIAL HISTORY:   Social History   Tobacco Use  . Smoking status: Former Smoker    Packs/day: 0.50    Years: 1.00    Pack years: 0.50    Types: Cigarettes  . Smokeless tobacco: Never Used  Vaping Use  . Vaping Use: Never used  Substance Use Topics  . Alcohol use: No    Alcohol/week: 0.0 standard drinks  . Drug use: No    ALLERGIES:  is allergic to fish-derived products and sulfamethoxazole-trimethoprim.  MEDICATIONS:  Current Outpatient Medications  Medication Sig Dispense Refill  .  acetaminophen-codeine (TYLENOL #4) 300-60 MG tablet Take 1 tablet by mouth 2 (two) times daily as needed for moderate pain.     Marland Kitchen albuterol (PROAIR HFA) 108 (90 BASE) MCG/ACT inhaler Inhale 2 puffs into the lungs every 6 (six) hours as needed for wheezing or shortness of breath.     Marland Kitchen albuterol (PROVENTIL) (2.5 MG/3ML) 0.083% nebulizer solution Inhale 2.5 mg into the lungs every 6 (six) hours as needed for shortness of breath.     . ALPRAZolam (XANAX) 0.5 MG tablet Take 0.5 mg by mouth 2 (two) times daily as needed for anxiety or sleep.     Marland Kitchen amLODipine (NORVASC) 10 MG tablet Take 10 mg by mouth daily.     Marland Kitchen aspirin EC 81 MG tablet Take 81 mg by mouth daily.     Marland Kitchen atenolol (TENORMIN) 100 MG tablet Take 100 mg by mouth 2 (two) times daily.     . bumetanide (BUMEX) 0.5 MG tablet Take 0.5 mg by mouth 2 (two) times daily.     . calcitRIOL (ROCALTROL) 0.25 MCG capsule Take 0.25 mcg by mouth daily.     . Cinnamon 500 MG capsule Take 500 mg by mouth 2 (two) times daily.     . cloNIDine (CATAPRES) 0.2 MG tablet Take 0.2 mg by mouth 2 (two) times daily.     . diflorasone (PSORCON) 0.05 % ointment Apply 1 application topically as directed.     Marland Kitchen ELDERBERRY PO Take 1 tablet by mouth daily.    . enalapril (VASOTEC) 10 MG tablet Take 20 mg by mouth 2 (two) times a day.     . famotidine (PEPCID) 20 MG tablet Take 20 mg by mouth 2 (two) times daily.     . ferrous sulfate 325 (65 FE) MG tablet Take 325 mg by mouth 2 (two) times daily with a meal.    . FLUoxetine (PROZAC) 20 MG capsule Take 20 mg by mouth 2 (two) times daily.     . Fluticasone Propionate, Inhal, (FLOVENT DISKUS) 100 MCG/BLIST AEPB Inhale 2 puffs into the lungs 2 (two) times daily as needed (respiratory problems).     . gabapentin (NEURONTIN) 100 MG capsule TAKE 1 CAPSULE(100 MG) BY MOUTH AT BEDTIME 30 capsule 6  . glyBURIDE (DIABETA) 5 MG tablet Take 10 mg by mouth 2 (two) times daily with a meal.     . LEVEMIR FLEXTOUCH 100 UNIT/ML Pen Inject 55  Units into the skin daily.     Marland Kitchen  loperamide (IMODIUM A-D) 2 MG capsule Take 2-4 mg by mouth 4 (four) times daily as needed for diarrhea or loose stools.     Marland Kitchen NOVOLOG FLEXPEN 100 UNIT/ML FlexPen Inject 7 Units into the skin 2 (two) times daily.     Marland Kitchen oxyCODONE-acetaminophen (PERCOCET/ROXICET) 5-325 MG tablet Take 1 tablet by mouth daily as needed for severe pain.    . simvastatin (ZOCOR) 20 MG tablet Take 20 mg by mouth every evening.     . vitamin B-12 (CYANOCOBALAMIN) 1000 MCG tablet Take 1,000 mcg by mouth daily.     No current facility-administered medications for this visit.   Facility-Administered Medications Ordered in Other Visits  Medication Dose Route Frequency Provider Last Rate Last Admin  . sodium chloride flush (NS) 0.9 % injection 10 mL  10 mL Intravenous PRN Cammie Sickle, MD   10 mL at 01/30/16 1054    PHYSICAL EXAMINATION: ECOG PERFORMANCE STATUS: 1 - Symptomatic but completely ambulatory  BP (!) 148/87 (BP Location: Right Arm, Patient Position: Sitting, Cuff Size: Large)   Pulse 75   Temp (!) 96.8 F (36 C) (Tympanic)   Resp 16   Ht '5\' 3"'$  (1.6 m)   Wt 188 lb (85.3 kg)   SpO2 99%   BMI 33.30 kg/m   Filed Weights   02/23/20 1405  Weight: 188 lb (85.3 kg)    Physical Exam Constitutional:      Comments: She is alone.  HENT:     Head: Normocephalic and atraumatic.     Mouth/Throat:     Pharynx: No oropharyngeal exudate.  Eyes:     Pupils: Pupils are equal, round, and reactive to light.  Cardiovascular:     Rate and Rhythm: Normal rate and regular rhythm.  Pulmonary:     Effort: No respiratory distress.     Breath sounds: Normal breath sounds. No wheezing.  Abdominal:     General: Bowel sounds are normal. There is no distension.     Palpations: Abdomen is soft. There is no mass.     Tenderness: There is no abdominal tenderness. There is no guarding or rebound.  Musculoskeletal:        General: No tenderness. Normal range of motion.      Cervical back: Normal range of motion and neck supple.  Skin:    General: Skin is warm.     Comments: Right thumb bandaged-wound healing.  No signs of infection or pus.  Neurological:     Mental Status: She is alert and oriented to person, place, and time.  Psychiatric:        Mood and Affect: Affect normal.     LABORATORY DATA:  I have reviewed the data as listed    Component Value Date/Time   NA 133 (L) 02/24/2020 0823   NA 130 (L) 06/06/2014 1102   K 4.0 02/24/2020 0823   K 3.9 06/06/2014 1102   CL 100 02/24/2020 0823   CL 95 (L) 06/06/2014 1102   CO2 23 02/24/2020 0823   CO2 28 06/06/2014 1102   GLUCOSE 218 (H) 02/24/2020 0823   GLUCOSE 349 (H) 06/06/2014 1102   BUN 36 (H) 02/24/2020 0823   BUN 17 06/06/2014 1102   CREATININE 2.62 (H) 02/24/2020 0823   CREATININE 1.63 (H) 06/06/2014 1102   CALCIUM 8.9 02/24/2020 0823   CALCIUM 9.2 06/06/2014 1102   PROT 7.6 02/24/2020 0823   PROT 8.2 06/06/2014 1102   ALBUMIN 3.7 02/24/2020 0823   ALBUMIN 3.3 (L) 06/06/2014  1102   AST 16 02/24/2020 0823   AST 7 (L) 06/06/2014 1102   ALT 15 02/24/2020 0823   ALT 12 (L) 06/06/2014 1102   ALKPHOS 80 02/24/2020 0823   ALKPHOS 74 06/06/2014 1102   BILITOT 0.7 02/24/2020 0823   BILITOT 0.4 06/06/2014 1102   GFRNONAA 19 (L) 02/24/2020 0823   GFRNONAA 35 (L) 06/06/2014 1102   GFRAA 22 (L) 02/24/2020 0823   GFRAA 42 (L) 06/06/2014 1102    No results found for: SPEP, UPEP  Lab Results  Component Value Date   WBC 10.6 (H) 02/24/2020   NEUTROABS 8.5 (H) 02/24/2020   HGB 10.0 (L) 02/24/2020   HCT 29.0 (L) 02/24/2020   MCV 90.3 02/24/2020   PLT 443 (H) 02/24/2020      Chemistry      Component Value Date/Time   NA 133 (L) 02/24/2020 0823   NA 130 (L) 06/06/2014 1102   K 4.0 02/24/2020 0823   K 3.9 06/06/2014 1102   CL 100 02/24/2020 0823   CL 95 (L) 06/06/2014 1102   CO2 23 02/24/2020 0823   CO2 28 06/06/2014 1102   BUN 36 (H) 02/24/2020 0823   BUN 17 06/06/2014 1102    CREATININE 2.62 (H) 02/24/2020 0823   CREATININE 1.63 (H) 06/06/2014 1102      Component Value Date/Time   CALCIUM 8.9 02/24/2020 0823   CALCIUM 9.2 06/06/2014 1102   ALKPHOS 80 02/24/2020 0823   ALKPHOS 74 06/06/2014 1102   AST 16 02/24/2020 0823   AST 7 (L) 06/06/2014 1102   ALT 15 02/24/2020 0823   ALT 12 (L) 06/06/2014 1102   BILITOT 0.7 02/24/2020 0823   BILITOT 0.4 06/06/2014 1102       RADIOGRAPHIC STUDIES: I have personally reviewed the radiological images as listed and agreed with the findings in the report. No results found.   ASSESSMENT & PLAN:  Carcinoma of upper-inner quadrant of left breast in female, estrogen receptor positive ([place]) Left breast cancer- stage IV- ER/PR +, HER-2/neu. Currently on eribulin [renally dosed [start at 1 mg/m] started on-04/29/2019.   PET scan MAY 10th, 2021-STABLE; Partial response/ uptake pleural-based lesion/lung lesions/left breast mass; but Ca-27-29- Rising. [discussed with pt];STABLE.   # Proceed with eribulin today; Labs today reviewed;  acceptable for treatment today. Discussed re: elevated TM; discrepancy with imaging; will repeat imaging at next visit.   # Bone mets-sclerotic; Right acetabular uptake-s/p radiation. Hypocalcemia-ca-8.3/low vitD- on vit D daily. STABLE.   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   #Knee pain arthritis-causing gait instability; awaiting arthroscopic surgery; awaiting cardiac clearance; awaiting 2 d echo. STABLE.   # Chronic kidney disease - stage IV-GFR 18; STABLE; [[name]  # Anemia- hemoglobin today-9/CKD-STABLE; On PO iron.  # DISPOSITION:  #  chemo today # 1 week- labs- cbc/bmp; Eribulin # Follow up in 3 weeks; /MD-labs- cbc/cmp-ca-27-29; Ca-15-3; CEA- Eribulin- Dr.B   Orders Placed This Encounter  Procedures  . CBC with Differential    Standing Status:   Future    Standing Expiration Date:   02/23/2021  . Basic metabolic panel    Standing Status:   Future     Standing Expiration Date:   02/23/2021  . CBC with Differential    Standing Status:   Future    Standing Expiration Date:   02/23/2021  . Comprehensive metabolic panel    Standing Status:   Future    Standing Expiration Date:   02/23/2021  . Cancer antigen  27.29    Standing Status:   Future    Standing Expiration Date:   02/23/2021  . Cancer antigen 15-3    Standing Status:   Future    Standing Expiration Date:   02/23/2021  . CEA    Standing Status:   Future    Standing Expiration Date:   02/23/2021   All questions were answered. The patient knows to call the clinic with any problems, questions or concerns.      Cammie Sickle, MD 02/26/2020 6:01 PM

## 2020-02-24 NOTE — Assessment & Plan Note (Addendum)
Left breast cancer- stage IV- ER/PR +, HER-2/neu. Currently on eribulin [renally dosed [start at 1 mg/m] started on-04/29/2019.   PET scan MAY 10th, 2021-STABLE; Partial response/ uptake pleural-based lesion/lung lesions/left breast mass; but Ca-27-29- Rising. [discussed with pt];STABLE.   # Proceed with eribulin today; Labs today reviewed;  acceptable for treatment today. Discussed re: elevated TM; discrepancy with imaging; will repeat imaging at next visit.   # Bone mets-sclerotic; Right acetabular uptake-s/p radiation. Hypocalcemia-ca-8.3/low vitD- on vit D daily. STABLE.   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   #Knee pain arthritis-causing gait instability; awaiting arthroscopic surgery; awaiting cardiac clearance; awaiting 2 d echo. STABLE.   # Chronic kidney disease - stage IV-GFR 18; STABLE; [[name]  # Anemia- hemoglobin today-9/CKD-STABLE; On PO iron.  # DISPOSITION:  #  chemo today # 1 week- labs- cbc/bmp; Eribulin # Follow up in 3 weeks; /MD-labs- cbc/cmp-ca-27-29; Ca-15-3; CEA- Eribulin- Dr.B

## 2020-02-25 LAB — CA 27.29 (SERIAL MONITOR): CA 27.29: 146.6 U/mL — ABNORMAL HIGH (ref 0.0–38.6)

## 2020-03-01 ENCOUNTER — Emergency Department: Payer: Medicare HMO

## 2020-03-01 ENCOUNTER — Encounter: Payer: Self-pay | Admitting: Emergency Medicine

## 2020-03-01 ENCOUNTER — Telehealth: Payer: Self-pay | Admitting: *Deleted

## 2020-03-01 ENCOUNTER — Inpatient Hospital Stay: Payer: Medicare HMO

## 2020-03-01 ENCOUNTER — Emergency Department
Admission: EM | Admit: 2020-03-01 | Discharge: 2020-03-01 | Disposition: A | Discharge status: Home / Self Care | Payer: Medicare HMO | Source: Home / Self Care | Attending: Emergency Medicine | Admitting: Emergency Medicine

## 2020-03-01 ENCOUNTER — Other Ambulatory Visit: Payer: Self-pay | Admitting: *Deleted

## 2020-03-01 ENCOUNTER — Other Ambulatory Visit: Payer: Self-pay

## 2020-03-01 ENCOUNTER — Inpatient Hospital Stay (HOSPITAL_BASED_OUTPATIENT_CLINIC_OR_DEPARTMENT_OTHER): Payer: Medicare HMO | Admitting: Nurse Practitioner

## 2020-03-01 VITALS — BP 93/66 | HR 73 | Temp 97.3°F | Resp 20 | Ht 63.0 in | Wt 189.0 lb

## 2020-03-01 DIAGNOSIS — Z79899 Other long term (current) drug therapy: Secondary | ICD-10-CM | POA: Insufficient documentation

## 2020-03-01 DIAGNOSIS — N184 Chronic kidney disease, stage 4 (severe): Secondary | ICD-10-CM | POA: Insufficient documentation

## 2020-03-01 DIAGNOSIS — Z87891 Personal history of nicotine dependence: Secondary | ICD-10-CM | POA: Insufficient documentation

## 2020-03-01 DIAGNOSIS — E1122 Type 2 diabetes mellitus with diabetic chronic kidney disease: Secondary | ICD-10-CM | POA: Insufficient documentation

## 2020-03-01 DIAGNOSIS — T451X5A Adverse effect of antineoplastic and immunosuppressive drugs, initial encounter: Secondary | ICD-10-CM

## 2020-03-01 DIAGNOSIS — R531 Weakness: Secondary | ICD-10-CM | POA: Diagnosis not present

## 2020-03-01 DIAGNOSIS — Z7984 Long term (current) use of oral hypoglycemic drugs: Secondary | ICD-10-CM | POA: Insufficient documentation

## 2020-03-01 DIAGNOSIS — R296 Repeated falls: Secondary | ICD-10-CM

## 2020-03-01 DIAGNOSIS — C50212 Malignant neoplasm of upper-inner quadrant of left female breast: Secondary | ICD-10-CM | POA: Insufficient documentation

## 2020-03-01 DIAGNOSIS — Z7951 Long term (current) use of inhaled steroids: Secondary | ICD-10-CM | POA: Insufficient documentation

## 2020-03-01 DIAGNOSIS — C3490 Malignant neoplasm of unspecified part of unspecified bronchus or lung: Secondary | ICD-10-CM | POA: Insufficient documentation

## 2020-03-01 DIAGNOSIS — R739 Hyperglycemia, unspecified: Secondary | ICD-10-CM

## 2020-03-01 DIAGNOSIS — D6481 Anemia due to antineoplastic chemotherapy: Secondary | ICD-10-CM | POA: Diagnosis not present

## 2020-03-01 DIAGNOSIS — J45909 Unspecified asthma, uncomplicated: Secondary | ICD-10-CM | POA: Insufficient documentation

## 2020-03-01 DIAGNOSIS — N3000 Acute cystitis without hematuria: Secondary | ICD-10-CM

## 2020-03-01 DIAGNOSIS — I5022 Chronic systolic (congestive) heart failure: Secondary | ICD-10-CM | POA: Insufficient documentation

## 2020-03-01 DIAGNOSIS — E1165 Type 2 diabetes mellitus with hyperglycemia: Secondary | ICD-10-CM | POA: Insufficient documentation

## 2020-03-01 DIAGNOSIS — S0990XA Unspecified injury of head, initial encounter: Secondary | ICD-10-CM | POA: Diagnosis not present

## 2020-03-01 DIAGNOSIS — S199XXA Unspecified injury of neck, initial encounter: Secondary | ICD-10-CM | POA: Diagnosis not present

## 2020-03-01 DIAGNOSIS — I130 Hypertensive heart and chronic kidney disease with heart failure and stage 1 through stage 4 chronic kidney disease, or unspecified chronic kidney disease: Secondary | ICD-10-CM | POA: Insufficient documentation

## 2020-03-01 DIAGNOSIS — Y92009 Unspecified place in unspecified non-institutional (private) residence as the place of occurrence of the external cause: Secondary | ICD-10-CM

## 2020-03-01 DIAGNOSIS — Z5111 Encounter for antineoplastic chemotherapy: Secondary | ICD-10-CM | POA: Diagnosis not present

## 2020-03-01 LAB — URINALYSIS, COMPLETE (UACMP) WITH MICROSCOPIC
Bilirubin Urine: NEGATIVE
Glucose, UA: >=500 mg/dL — AB
Ketones, ur: NEGATIVE mg/dL
Nitrite: NEGATIVE
Protein, ur: 100 mg/dL — AB
Specific Gravity, Urine: 1.009 (ref 1.005–1.030)
pH: 5 (ref 5.0–8.0)

## 2020-03-01 LAB — SAMPLE TO BLOOD BANK

## 2020-03-01 LAB — GLUCOSE, CAPILLARY
Glucose-Capillary: 421 mg/dL — ABNORMAL HIGH (ref 70–99)
Glucose-Capillary: 217 mg/dL — ABNORMAL HIGH (ref 70–99)

## 2020-03-01 LAB — CBC WITH DIFFERENTIAL/PLATELET
Abs Immature Granulocytes: 0.34 10*3/uL — ABNORMAL HIGH (ref 0.00–0.07)
Basophils Absolute: 0.1 10*3/uL (ref 0.0–0.1)
Basophils Relative: 2 %
Eosinophils Absolute: 0 10*3/uL (ref 0.0–0.5)
Eosinophils Relative: 0 %
HCT: 24.8 % — ABNORMAL LOW (ref 36.0–46.0)
Hemoglobin: 8.7 g/dL — ABNORMAL LOW (ref 12.0–15.0)
Immature Granulocytes: 5 %
Lymphocytes Relative: 14 %
Lymphs Abs: 0.9 10*3/uL (ref 0.7–4.0)
MCH: 31.1 pg (ref 26.0–34.0)
MCHC: 35.1 g/dL (ref 30.0–36.0)
MCV: 88.6 fL (ref 80.0–100.0)
Monocytes Absolute: 0.3 10*3/uL (ref 0.1–1.0)
Monocytes Relative: 5 %
Neutro Abs: 4.8 10*3/uL (ref 1.7–7.7)
Neutrophils Relative %: 74 %
Platelets: 342 10*3/uL (ref 150–400)
RBC: 2.8 MIL/uL — ABNORMAL LOW (ref 3.87–5.11)
RDW: 13.4 % (ref 11.5–15.5)
WBC: 6.6 10*3/uL (ref 4.0–10.5)
nRBC: 0 % (ref 0.0–0.2)

## 2020-03-01 LAB — COMPREHENSIVE METABOLIC PANEL
ALT: 14 U/L (ref 0–44)
AST: 13 U/L — ABNORMAL LOW (ref 15–41)
Albumin: 3.4 g/dL — ABNORMAL LOW (ref 3.5–5.0)
Alkaline Phosphatase: 75 U/L (ref 38–126)
Anion gap: 9 (ref 5–15)
BUN: 37 mg/dL — ABNORMAL HIGH (ref 8–23)
CO2: 23 mmol/L (ref 22–32)
Calcium: 8.5 mg/dL — ABNORMAL LOW (ref 8.9–10.3)
Chloride: 98 mmol/L (ref 98–111)
Creatinine, Ser: 3.16 mg/dL — ABNORMAL HIGH (ref 0.44–1.00)
GFR calc Af Amer: 17 mL/min — ABNORMAL LOW (ref >60)
GFR calc non Af Amer: 15 mL/min — ABNORMAL LOW (ref >60)
Glucose, Bld: 444 mg/dL — ABNORMAL HIGH (ref 70–99)
Potassium: 4.2 mmol/L (ref 3.5–5.1)
Sodium: 130 mmol/L — ABNORMAL LOW (ref 135–145)
Total Bilirubin: 0.6 mg/dL (ref 0.3–1.2)
Total Protein: 6.8 g/dL (ref 6.5–8.1)

## 2020-03-01 LAB — BETA-HYDROXYBUTYRIC ACID: Beta-Hydroxybutyric Acid: <0.05 mmol/L — ABNORMAL LOW (ref 0.05–0.27)

## 2020-03-01 LAB — PROTIME-INR
INR: 1 (ref 0.8–1.2)
Prothrombin Time: 13 seconds (ref 11.4–15.2)

## 2020-03-01 MED ORDER — CEPHALEXIN 250 MG PO CAPS: 250.0000 mg | ORAL_CAPSULE | Freq: Two times a day (BID) | ORAL | 0 refills | Status: AC

## 2020-03-01 MED ORDER — CEPHALEXIN 250 MG PO CAPS
250.0000 mg | ORAL_CAPSULE | Freq: Once | ORAL | Status: AC
  Administered 2020-03-01: 250 mg via ORAL

## 2020-03-01 MED ORDER — CEPHALEXIN 500 MG PO CAPS
500.0000 mg | ORAL_CAPSULE | Freq: Once | ORAL | Status: DC
  Filled 2020-03-01: qty 1

## 2020-03-01 MED ORDER — LACTATED RINGERS IV BOLUS
500.0000 mL | Freq: Once | INTRAVENOUS | Status: AC
  Administered 2020-03-01: 500 mL via INTRAVENOUS

## 2020-03-01 NOTE — ED Triage Notes (Signed)
Pt presents to ED via wheelchair from Cancer center. Pt states increasing difficulty walking due to her knees that she needs to have surgery on. Pt states was sent over by the cancer center due to her blood sugar being up.   Pt states CBG at cancer center was over 400, states at home CBG has been in the 130's.

## 2020-03-01 NOTE — ED Provider Notes (Signed)
[facility] Emergency Department Provider Note   ____________________________________________   First MD Initiated Contact with Patient 03/01/20 1719     (approximate)  I have reviewed the triage vital signs and the nursing notes.   HISTORY  Chief Complaint Hyperglycemia    HPI [name] is a 64 y.o. female with past medical history of hypertension, diabetes, CKD, CHF, stroke, and metastatic breast cancer who presents to the ED for hyperglycemia.  Patient reports that she was having regular follow-up with her oncologist earlier today, and when they saw that she was hyperglycemic she was referred to the ED for further evaluation.  She states that she has been feeling "slightly off" but has been compliant with her diabetic regimen and has not had any fevers.  She denies any cough, chest pain, shortness of breath, abdominal pain, vomiting, diarrhea, dysuria, or hematuria.  She does state that she has been unsteady on her legs with a couple falls recently, typically gets around her house with a walker.  She had a fall last night where she hit her left knee and her head, but denies losing consciousness.  She is not anticoagulated.        Past Medical History:  Diagnosis Date  . Anemia   . Anxiety   . Asthma   . Cancer ([place]) 03/10/2018   Per NM PET order. Carcinoma of upper-inner quadrant of left breast in female, estrogen receptor positive .  Marland Kitchen Cancer (HCC)    LUNG  . CHF (congestive heart failure) ([place]) 1997  . CKD (chronic kidney disease)   . Depression   . Diabetes mellitus, type 2 ([place])   . Family history of breast cancer   . Family history of colon cancer   . Family history of ovarian cancer   . Family history of pancreatic cancer   . Family history of prostate cancer   . Family history of stomach cancer   . GERD (gastroesophageal reflux disease)    history of an ulcer  . Hair loss   . History of left breast cancer 05/29/14  . History of  partial hysterectomy 12/31/2016   Per patient.  Has not had a period in years.  Had a partial hysterectomy years ago.  Marland Kitchen Hypertension   . Mitral valve regurgitation   . Neuromuscular disorder (HCC)    neuropathies in hand  . Obesity   . Pancreatitis 1997  . Stroke [facility]) 2010   with mild left arm weakness    Patient Active Problem List   Diagnosis Date Noted  . Tear of meniscus of knee 11/17/2019  . Metabolic acidosis   . Acute renal failure superimposed on stage 4 chronic kidney disease ([place]) 09/27/2019  . Metastatic breast cancer ([place]) 09/27/2019  . Hypertension associated with diabetes ([place]) 09/27/2019  . Hyperlipidemia associated with type 2 diabetes mellitus ([place]) 09/27/2019  . Hyperkalemia 09/27/2019  . Taking a statin medication 05/18/2019  . Anemia of chronic disease 04/27/2019  . Benign hypertensive kidney disease with chronic kidney disease 04/27/2019  . Hyposmolality and/or hyponatremia 04/27/2019  . Proteinuria 04/27/2019  . Secondary hyperparathyroidism of renal origin ([place]) 04/27/2019  . Goals of care, counseling/discussion 10/01/2018  . Major depressive disorder, recurrent, in remission ([place]) 09/13/2018  . Metastasis from malignant tumor of breast ([place]) 09/13/2018  . Genetic testing 07/02/2018  . Family history of breast cancer   . Family history of ovarian cancer   . Family history of pancreatic cancer   . Family history  of colon cancer   . Family history of stomach cancer   . Family history of prostate cancer   . Carcinoma of upper-inner quadrant of left breast in female, estrogen receptor positive ([place]) 05/23/2016  . Type 2 diabetes mellitus with diabetic chronic kidney disease ([place]) 08/27/2015  . Cerebrovascular accident (CVA) ([place]) 08/27/2015  . Benign essential HTN 12/15/2014  . Chronic systolic CHF (congestive heart failure) ([place]) 06/21/2014  . Combined fat and carbohydrate induced hyperlipemia 06/21/2014  . MI (mitral incompetence) 06/21/2014  .  Asthma 06/09/2014  . Chronic kidney disease (CKD), stage V ([place]) 06/09/2014  . Depression with anxiety 06/09/2014    Past Surgical History:  Procedure Laterality Date  . CATARACT EXTRACTION W/PHACO Right 02/24/2019   Procedure: CATARACT EXTRACTION PHACO AND INTRAOCULAR LENS PLACEMENT ([place]) RIGHT DIABETES;  Surgeon: Marchia Meiers, MD;  Location: [facility] ORS;  Service: Ophthalmology;  Laterality: Right;  [place] 01:13.0 CDE 7.96 Fluid Pack Lot # [id number]  . CATARACT EXTRACTION W/PHACO Left 03/24/2019   Procedure: CATARACT EXTRACTION PHACO AND INTRAOCULAR LENS PLACEMENT (IOC) - left diabetic;  Surgeon: Marchia Meiers, MD;  Location: [facility] ORS;  Service: Ophthalmology;  Laterality: Left;  [place]  01:36 CDE 13.93 Fluid pack lot # [id number]  . CESAREAN SECTION    . CHOLECYSTECTOMY    . EXCISION OF TONGUE LESION N/A 08/17/2018   Procedure: EXCISION OF TONGUE LESION WITH FROZEN SECTIONS;  Surgeon: Beverly Gust, MD;  Location: [facility] ORS;  Service: ENT;  Laterality: N/A;  . EYE SURGERY Right    cataract extraction  . PARTIAL HYSTERECTOMY  12/31/2016   Per patient, she has not had a period in years since she had a partial hysterectomy.  Marland Kitchen PORTA CATH INSERTION    . TUBAL LIGATION      Prior to Admission medications   Medication Sig Start Date End Date Taking? Authorizing Provider  acetaminophen-codeine (TYLENOL #4) 300-60 MG tablet Take 1 tablet by mouth 2 (two) times daily as needed for moderate pain.  Patient not taking: Reported on 03/01/2020    [provider]  albuterol (PROAIR HFA) 108 (90 BASE) MCG/ACT inhaler Inhale 2 puffs into the lungs every 6 (six) hours as needed for wheezing or shortness of breath.  06/20/14   [provider]  albuterol (PROVENTIL) (2.5 MG/3ML) 0.083% nebulizer solution Inhale 2.5 mg into the lungs every 6 (six) hours as needed for shortness of breath.  Patient not taking: Reported on 03/01/2020 07/17/14   [provider]  ALPRAZolam Duanne Moron) 0.5 MG  tablet Take 0.5 mg by mouth 2 (two) times daily as needed for anxiety or sleep.     [provider]  amLODipine (NORVASC) 10 MG tablet Take 10 mg by mouth daily.  06/21/14   [provider]  aspirin EC 81 MG tablet Take 81 mg by mouth daily.  06/21/14   [provider]  atenolol (TENORMIN) 100 MG tablet Take 100 mg by mouth 2 (two) times daily.  04/07/14   [provider]  bumetanide (BUMEX) 0.5 MG tablet Take 0.5 mg by mouth 2 (two) times daily.  08/22/14   [provider]  calcitRIOL (ROCALTROL) 0.25 MCG capsule Take 0.25 mcg by mouth daily.  09/21/16   [provider]  cephALEXin (KEFLEX) 250 MG capsule Take 1 capsule (250 mg total) by mouth 2 (two) times daily for 7 days. 03/01/20 03/08/20  Blake Divine, MD  Cinnamon 500 MG capsule Take 500 mg by mouth 2 (two) times daily.     [provider]  cloNIDine (CATAPRES) 0.2 MG tablet Take 0.2 mg by mouth 2 (two) times daily.  06/21/14   [provider]  diflorasone (PSORCON) 0.05 % ointment Apply 1 application topically as directed.  Patient not taking: Reported on 03/01/2020    [provider]  ELDERBERRY PO Take 1 tablet by mouth daily. Patient not taking: Reported on 03/01/2020    [provider]  enalapril (VASOTEC) 10 MG tablet Take 20 mg by mouth 2 (two) times a day.  01/12/19   [provider]  famotidine (PEPCID) 20 MG tablet Take 20 mg by mouth 2 (two) times daily.  09/14/19   [provider]  ferrous sulfate 325 (65 FE) MG tablet Take 325 mg by mouth 2 (two) times daily with a meal. Patient not taking: Reported on 03/01/2020    [provider]  FLUoxetine (PROZAC) 20 MG capsule Take 20 mg by mouth 2 (two) times daily.  06/21/14   [provider]  Fluticasone Propionate, Inhal, (FLOVENT DISKUS) 100 MCG/BLIST AEPB Inhale 2 puffs into the lungs 2 (two) times daily as needed (respiratory problems).  Patient not taking: Reported  on 03/01/2020    [provider]  gabapentin (NEURONTIN) 100 MG capsule TAKE 1 CAPSULE(100 MG) BY MOUTH AT BEDTIME 11/04/19   Cammie Sickle, MD  glyBURIDE (DIABETA) 5 MG tablet Take 10 mg by mouth 2 (two) times daily with a meal.  06/20/14   [provider]  LEVEMIR FLEXTOUCH 100 UNIT/ML Pen Inject 55 Units into the skin daily.  05/09/15   [provider]  loperamide (IMODIUM A-D) 2 MG capsule Take 2-4 mg by mouth 4 (four) times daily as needed for diarrhea or loose stools.  Patient not taking: Reported on 03/01/2020    [provider]  NOVOLOG FLEXPEN 100 UNIT/ML FlexPen Inject 7 Units into the skin 2 (two) times daily.  11/09/15   [provider]  oxyCODONE-acetaminophen (PERCOCET/ROXICET) 5-325 MG tablet Take 1 tablet by mouth daily as needed for severe pain. Patient not taking: Reported on 03/01/2020    [provider]  simvastatin (ZOCOR) 20 MG tablet Take 20 mg by mouth every evening.     [provider]  vitamin B-12 (CYANOCOBALAMIN) 1000 MCG tablet Take 1,000 mcg by mouth daily.    [provider]    Allergies Fish-derived products and Sulfamethoxazole-trimethoprim  Family History  Problem Relation Age of Onset  . Ovarian cancer Mother 15  . Diabetes Mother   . Hypertension Mother   . COPD Father   . Hypertension Father   . Colon cancer Father 2  . Diabetes Sister   . Breast cancer Sister 63       bilateral  . Diabetes Brother   . Leukemia Maternal Aunt   . Pancreatic cancer Paternal Aunt 72  . Pancreatic cancer Paternal Uncle   . Colon cancer Paternal Uncle   . Stomach cancer Maternal Grandfather 44  . Throat cancer Paternal Grandmother   . Breast cancer Maternal Aunt 80  . Colon cancer Maternal Aunt   . Bone cancer Maternal Aunt   . Breast cancer Paternal Aunt        dx >50  . Prostate cancer Paternal Uncle   . Pancreatic cancer Paternal Uncle   . Throat cancer Paternal Uncle   . Lung cancer  Paternal Uncle   . Stomach cancer Paternal Uncle   . Brain cancer Paternal Aunt   . Cancer Cousin  liver, kidney  . Prostate cancer Cousin        meastatic  . Lung cancer Other     Social History Social History   Tobacco Use  . Smoking status: Former Smoker    Packs/day: 0.50    Years: 1.00    Pack years: 0.50    Types: Cigarettes  . Smokeless tobacco: Never Used  Vaping Use  . Vaping Use: Never used  Substance Use Topics  . Alcohol use: No    Alcohol/week: 0.0 standard drinks  . Drug use: No    Review of Systems  Constitutional: No fever/chills.  Positive for generalized weakness and hyperglycemia. Eyes: No visual changes. ENT: No sore throat. Cardiovascular: Denies chest pain. Respiratory: Denies shortness of breath. Gastrointestinal: No abdominal pain.  No nausea, no vomiting.  No diarrhea.  No constipation. Genitourinary: Negative for dysuria. Musculoskeletal: Negative for back pain. Skin: Negative for rash. Neurological: Negative for headaches, focal weakness or numbness.  ____________________________________________   PHYSICAL EXAM:  VITAL SIGNS: ED Triage Vitals [03/01/20 1438]  Enc Vitals Group     BP 110/80     Pulse Rate 71     Resp 20     Temp      Temp src      SpO2 99 %     Weight 189 lb (85.7 kg)     Height 5\' 3"  (1.6 m)     Head Circumference      Peak Flow      Pain Score 0     Pain Loc      Pain Edu?      Excl. in [place]?     Constitutional: Alert and oriented. Eyes: Conjunctivae are normal. Head: Atraumatic. Nose: No congestion/rhinnorhea. Mouth/Throat: Mucous membranes are moist. Neck: Normal ROM Cardiovascular: Normal rate, regular rhythm. Grossly normal heart sounds. Respiratory: Normal respiratory effort.  No retractions. Lungs CTAB. Gastrointestinal: Soft and nontender. No distention. Genitourinary: deferred Musculoskeletal: No lower extremity tenderness nor edema.  Abrasion to left anterior knee with no associated  bony tenderness, range of motion to bilateral lower extremities is intact without pain. Neurologic:  Normal speech and language. No gross focal neurologic deficits are appreciated. Skin:  Skin is warm, dry and intact. No rash noted. Psychiatric: Mood and affect are normal. Speech and behavior are normal.  ____________________________________________   LABS (all labs ordered are listed, but only abnormal results are displayed)  Labs Reviewed  GLUCOSE, CAPILLARY - Abnormal; Notable for the following components:      Result Value   Glucose-Capillary 421 (*)    All other components within normal limits  BETA-HYDROXYBUTYRIC ACID - Abnormal; Notable for the following components:   Beta-Hydroxybutyric Acid <0.05 (*)    All other components within normal limits  URINALYSIS, COMPLETE (UACMP) WITH MICROSCOPIC - Abnormal; Notable for the following components:   Color, Urine YELLOW (*)    APPearance HAZY (*)    Glucose, UA >=500 (*)    Hgb urine dipstick SMALL (*)    Protein, ur 100 (*)    Leukocytes,Ua LARGE (*)    Bacteria, UA RARE (*)    All other components within normal limits  GLUCOSE, CAPILLARY - Abnormal; Notable for the following components:   Glucose-Capillary 217 (*)    All other components within normal limits  URINE CULTURE    PROCEDURES  Procedure(s) performed (including Critical Care):  Procedures   ____________________________________________   INITIAL IMPRESSION / ASSESSMENT AND PLAN / ED COURSE  64 year old female with past medical history of hypertension, diabetes, CKD, CHF, stroke, and metastatic breast cancer presents to the ED complaining of generalized weakness and hyperglycemia noted on lab work at her oncologist office.  Labs from the office were reviewed and show hyperglycemia with no evidence of DKA, beta hydroxybutyrate is also within normal limits here in the ED.  Patient also with chronic kidney disease and creatinine near her baseline, chronic  anemia noted with H&H similar to her baseline as well.  She states she has been compliant with her diabetic regimen, we will hydrate with IV fluids and recheck blood glucose.  She did have a recent fall and we will screen CT head and C-spine.  She denies any respiratory symptoms to suggest pulmonary infectious process, we will screen for UTI.  Glucose is improving following IV fluids, UA does appear consistent with UTI which would explain her weakness and hyperglycemia.  We will culture urine and treat with Keflex, patient is appropriate for discharge home with PCP follow-up.  She was counseled to return to the ED for new worsening symptoms, patient agrees with plan.      ____________________________________________   FINAL CLINICAL IMPRESSION(S) / ED DIAGNOSES  Final diagnoses:  Hyperglycemia  Acute cystitis without hematuria     ED Discharge Orders         Ordered    cephALEXin (KEFLEX) 250 MG capsule  2 times daily     Discontinue  Reprint     03/01/20 1943           Note:  This document was prepared using Dragon voice recognition software and may include unintentional dictation errors.   Blake Divine, MD 03/01/20 1945

## 2020-03-01 NOTE — ED Notes (Signed)
Pt taken for CT 

## 2020-03-01 NOTE — Progress Notes (Unsigned)
Per dr Rogue Bussing pt has to go to ER due to gluc 444, creat3.16, hgb8.7. b/p 93/66 and she has fell wice in 24 hours and his her head on sink. Also has a skin tear with dried blood on her left knee. Called report to [place] in ER. I took pt to ER

## 2020-03-01 NOTE — ED Notes (Signed)
Pt given meal tray at this time 

## 2020-03-01 NOTE — Telephone Encounter (Signed)
Patient called reporting that her knees are giving out more frequently and that she fell yesterday morning hitting her head on the sink and scraping her knee. She states she does not have a knot and thinks she is alright, but wants to discuss this with Dr B todaydue to having chemotherapy tomorrow and ECHO on the17th for cardiac clearance for her upcoming knee surgery. Please return her call [phone]

## 2020-03-01 NOTE — ED Triage Notes (Signed)
This RN spoke with Dr. Ellender Hose regarding labs, pt had CBC and metabolic panel done a [facility] P[facility]. Dr. Ellender Hose able to add on blood work at this time.

## 2020-03-01 NOTE — Telephone Encounter (Signed)
Sounds good- this afternoon

## 2020-03-01 NOTE — Progress Notes (Signed)
multiple falls in the last 24 hours. Skin tear noted on left knee from fall yesterday. Patient admits to hitting the left side of the head on sink.

## 2020-03-01 NOTE — Telephone Encounter (Signed)
Spoke with Dr. Rogue Bussing. Md would like patient to be evaluated in [facility] today with cbc/metc/ pt/inr

## 2020-03-02 ENCOUNTER — Inpatient Hospital Stay: Payer: Medicare HMO

## 2020-03-02 DIAGNOSIS — T451X5A Adverse effect of antineoplastic and immunosuppressive drugs, initial encounter: Secondary | ICD-10-CM | POA: Insufficient documentation

## 2020-03-02 DIAGNOSIS — W19XXXA Unspecified fall, initial encounter: Secondary | ICD-10-CM | POA: Insufficient documentation

## 2020-03-02 DIAGNOSIS — R531 Weakness: Secondary | ICD-10-CM | POA: Insufficient documentation

## 2020-03-02 DIAGNOSIS — D6481 Anemia due to antineoplastic chemotherapy: Secondary | ICD-10-CM | POA: Insufficient documentation

## 2020-03-02 NOTE — Progress Notes (Signed)
[facility] [place]  [phone] [phone]  Patient Care Team: [name], [name], MD as Medical Oncologist (Medical Oncology) Corey Skains, MD as Consulting Physician (Cardiology)   Name of the patient: [name]  [id number]  Dec 17, 1955   Date of visit: 03/01/20  Diagnosis-metastatic breast cancer  Chief complaint/ Reason for visit-fall and weakness  Heme/Onc history:  Oncology History Overview Note  # OCT 2015-STAGE IV LEFT BREAST T2N1 [T=4cm; [name]; [name]; [name]; EBUS- Positive Paratrac/subcarinal LN s/p ? Taxotere [in [place]; [name]; [name];[name]; [name]; suv 3]; [name]; [name]; [name];  [name]; Ant R iliac bone; [name]; [name]; START-Taxol weekly. March 2020- Taxol every 2 weeks [PN]; SEP 2020- PET progression  # SEP 04/29/2019- ERIBULIN s/p RT - Right hip- [s/p RT- NOV 2020]  # Poorly controlled Blood sugars- improved.   # Pancreatitis Hx/PEI- on creon in past / CKD IV [creat ~ 3-4; [name]; [name]; mild left sided weakness]  # Jan 2020-  Lobular lesion on tongue- s/p excision pyogenic granuloma [Dr.McQueen]   # GENETIC TESTING/COUNSELLING: HETEROZYGOUS Cystic Fibrosis Gene [explains hx of recurrent pancreatitis]  # MOLECULAR TESTING: NA   # PALLIATIVE CARE: 1/22-Discussed/Declined ------------------------------------------------    DIAGNOSIS: [ 2015] BREAST CA; [name]; [name] ;GOALS: Palliative  CURRENT/MOST RECENT THERAPY: ERIBULIN [C].     Carcinoma of upper-inner quadrant of left breast in female, estrogen receptor positive ([place])  04/29/2019 -  Chemotherapy   The patient had eriBULin mesylate ([place]) 2 mg in sodium chloride 0.9 % 100 mL chemo infusion, 2 mg, Intravenous,  Once, 12 of 14 cycles Dose modification: 1 mg/m2 (original dose 1 mg/m2, Cycle 1, Reason: Provider Judgment) Administration: 2 mg (06/03/2019), 2 mg (04/29/2019), 2 mg (06/10/2019), 2 mg (07/04/2019), 2 mg (07/11/2019), 2 mg (07/25/2019), 2 mg (08/03/2019), 2 mg (08/19/2019), 2 mg (08/26/2019), 2 mg (09/09/2019), 2 mg (09/16/2019), 2 mg (10/07/2019), 2 mg (10/14/2019), 2 mg (10/28/2019), 2 mg (11/04/2019), 2 mg (11/28/2019), 2 mg (12/09/2019), 2 mg (01/13/2020), 2 mg (01/20/2020), 2 mg (02/02/2020), 2 mg (02/13/2020), 2 mg (02/24/2020)  for chemotherapy treatment.      Interval history-patient 64 year old female with past medical history significant for hypertension, diabetes, CKD, CHF, stroke, and metastatic breast cancer who presents to clinic today for fall and weakness.  Says that she has felt "off" for the past few days and has noticed increased unsteadiness of her legs.  Endorses 2 falls in the last 24 hours.  Typically uses a walker.  Says she hit her left knee and head.  Not on anticoagulation.  Denies losing consciousness.  Scrape on her left knee but no other injuries or pain.  Says she has been compliant with her diabetes medications.  Denies recent fevers or illness.  No neurologic complaints.  Denies easy bleeding or bruising.  Denies chest pain or shortness of breath.  Denies nausea, vomiting, constipation, or diarrhea.  Denies urinary complaints.  No further specific complaints  today.   ECOG FS: 2-3  Review of systems- Review of Systems  Constitutional: Positive for malaise/fatigue. Negative for chills, fever and weight loss.  HENT:  Negative for hearing loss, nosebleeds, sore throat and tinnitus.   Eyes: Negative for blurred vision and double vision.  Respiratory: Negative for cough, hemoptysis, [name], palpitations and leg swelling.  Gastrointestinal: Negative for abdominal pain, blood in stool, constipation, diarrhea, melena, nausea and vomiting.  Genitourinary: Negative for dysuria and urgency.  Musculoskeletal: Positive for falls and joint pain. Negative for back pain and myalgias.  Skin: Negative for itching and rash.  Neurological: Positive for weakness. Negative for dizziness, tingling, sensory change, loss of consciousness and headaches.  Endo/Heme/Allergies: Negative for environmental allergies. Does not bruise/bleed easily.  Psychiatric/Behavioral: Negative for depression. The patient is not nervous/anxious and does not have insomnia.       Allergies  Allergen Reactions  . Fish-Derived Products   . Sulfamethoxazole-Trimethoprim Other (See Comments)    Past Medical History:  Diagnosis Date  . Anemia   . Anxiety   . Asthma   . Cancer ([place]) 03/10/2018   Per NM PET order. Carcinoma of upper-inner quadrant of left breast in female, estrogen receptor positive .  Marland Kitchen Cancer (HCC)    LUNG  . CHF (congestive heart failure) ([place]) 1997  . CKD (chronic kidney disease)   . Depression   . Diabetes mellitus, type 2 ([place])   . Family history of breast cancer   . Family history of colon cancer   . Family history of ovarian cancer   . Family history of pancreatic cancer   . Family history of prostate cancer   . Family history of stomach cancer   . GERD (gastroesophageal reflux disease)    history of an ulcer  . Hair loss   . History of left breast cancer 05/29/14  . History of partial hysterectomy 12/31/2016   Per patient.  Has not had a period in years.  Had a partial hysterectomy years ago.  Marland Kitchen Hypertension   . Mitral valve regurgitation     . Neuromuscular disorder (HCC)    neuropathies in hand  . Obesity   . Pancreatitis 1997  . Stroke [facility]) 2010   with mild left arm weakness    Past Surgical History:  Procedure Laterality Date  . CATARACT EXTRACTION W/PHACO Right 02/24/2019   Procedure: CATARACT EXTRACTION PHACO AND INTRAOCULAR LENS PLACEMENT ([place]) RIGHT DIABETES;  Surgeon: Marchia Meiers, MD;  Location: [facility] ORS;  Service: Ophthalmology;  Laterality: Right;  [place] 01:13.0 CDE 7.96 Fluid Pack Lot # [id number]  . CATARACT EXTRACTION W/PHACO Left 03/24/2019   Procedure: CATARACT EXTRACTION PHACO AND INTRAOCULAR LENS PLACEMENT ([facility]) - left diabetic;  Surgeon: Marchia Meiers, MD;  Location: [facility] ORS;  Service: Ophthalmology;  Laterality: Left;  [place]  01:36 CDE 13.93 Fluid pack lot # [id number]  . CESAREAN SECTION    . CHOLECYSTECTOMY    . EXCISION OF TONGUE LESION N/A 08/17/2018   Procedure: EXCISION OF TONGUE LESION WITH FROZEN SECTIONS;  Surgeon: Beverly Gust, MD;  Location: [facility] ORS;  Service: ENT;  Laterality: N/A;  . EYE SURGERY Right    cataract extraction  . PARTIAL HYSTERECTOMY  12/31/2016   Per patient, she has not had a period in years since she had a partial hysterectomy.  Marland Kitchen PORTA CATH INSERTION    . TUBAL LIGATION      Social History   Socioeconomic History  .  Marital status: Single    Spouse name: S.O.. .... keith  . Number of children: Not on file  . Years of education: Not on file  . Highest education level: Not on file  Occupational History    Comment: disabled  Tobacco Use  . Smoking status: Former Smoker    Packs/day: 0.50    Years: 1.00    Pack years: 0.50    Types: Cigarettes  . Smokeless tobacco: Never Used  Vaping Use  . Vaping Use: Never used  Substance and Sexual Activity  . Alcohol use: No    Alcohol/week: 0.0 standard drinks  . Drug use: No  . Sexual activity: Not on file    Comment: quit 8 years ago  Other Topics Concern  . Not on file  Social History Narrative  . Not on file    Social Determinants of Health   Financial Resource Strain:   . Difficulty of Paying Living Expenses:   Food Insecurity:   . Worried About Charity fundraiser in the Last Year:   . Arboriculturist in the Last Year:   Transportation Needs:   . Film/video editor (Medical):   Marland Kitchen Lack of Transportation (Non-Medical):   Physical Activity:   . Days of Exercise per Week:   . Minutes of Exercise per Session:   Stress:   . Feeling of Stress :   Social Connections:   . Frequency of Communication with Friends and Family:   . Frequency of Social Gatherings with Friends and Family:   . Attends Religious Services:   . Active Member of Clubs or Organizations:   . Attends Archivist Meetings:   Marland Kitchen Marital Status:   Intimate Partner Violence:   . Fear of Current or Ex-Partner:   . Emotionally Abused:   Marland Kitchen Physically Abused:   . Sexually Abused:     Family History  Problem Relation Age of Onset  . Ovarian cancer Mother 69  . Diabetes Mother   . Hypertension Mother   . COPD Father   . Hypertension Father   . Colon cancer Father 15  . Diabetes Sister   . Breast cancer Sister 98       bilateral  . Diabetes Brother   . Leukemia Maternal Aunt   . Pancreatic cancer Paternal Aunt 53  . Pancreatic cancer Paternal Uncle   . Colon cancer Paternal Uncle   . Stomach cancer Maternal Grandfather 41  . Throat cancer Paternal Grandmother   . Breast cancer Maternal Aunt 80  . Colon cancer Maternal Aunt   . Bone cancer Maternal Aunt   . Breast cancer Paternal Aunt        dx >50  . Prostate cancer Paternal Uncle   . Pancreatic cancer Paternal Uncle   . Throat cancer Paternal Uncle   . Lung cancer Paternal Uncle   . Stomach cancer Paternal Uncle   . Brain cancer Paternal Aunt   . Cancer Cousin        liver, kidney  . Prostate cancer Cousin        meastatic  . Lung cancer Other      Current Outpatient Medications:  .  albuterol (PROAIR HFA) 108 (90 BASE) MCG/ACT inhaler,  Inhale 2 puffs into the lungs every 6 (six) hours as needed for wheezing or shortness of breath. , Disp: , Rfl:  .  ALPRAZolam (XANAX) 0.5 MG tablet, Take 0.5 mg by mouth 2 (two) times daily as needed for anxiety  or sleep. , Disp: , Rfl:  .  amLODipine (NORVASC) 10 MG tablet, Take 10 mg by mouth daily. , Disp: , Rfl:  .  aspirin EC 81 MG tablet, Take 81 mg by mouth daily. , Disp: , Rfl:  .  atenolol (TENORMIN) 100 MG tablet, Take 100 mg by mouth 2 (two) times daily. , Disp: , Rfl:  .  bumetanide (BUMEX) 0.5 MG tablet, Take 0.5 mg by mouth 2 (two) times daily. , Disp: , Rfl:  .  calcitRIOL (ROCALTROL) 0.25 MCG capsule, Take 0.25 mcg by mouth daily. , Disp: , Rfl:  .  Cinnamon 500 MG capsule, Take 500 mg by mouth 2 (two) times daily. , Disp: , Rfl:  .  cloNIDine (CATAPRES) 0.2 MG tablet, Take 0.2 mg by mouth 2 (two) times daily. , Disp: , Rfl:  .  enalapril (VASOTEC) 10 MG tablet, Take 20 mg by mouth 2 (two) times a day. , Disp: , Rfl:  .  famotidine (PEPCID) 20 MG tablet, Take 20 mg by mouth 2 (two) times daily. , Disp: , Rfl:  .  FLUoxetine (PROZAC) 20 MG capsule, Take 20 mg by mouth 2 (two) times daily. , Disp: , Rfl:  .  gabapentin (NEURONTIN) 100 MG capsule, TAKE 1 CAPSULE(100 MG) BY MOUTH AT BEDTIME, Disp: 30 capsule, Rfl: 6 .  glyBURIDE (DIABETA) 5 MG tablet, Take 10 mg by mouth 2 (two) times daily with a meal. , Disp: , Rfl:  .  LEVEMIR FLEXTOUCH 100 UNIT/ML Pen, Inject 55 Units into the skin daily. , Disp: , Rfl:  .  NOVOLOG FLEXPEN 100 UNIT/ML FlexPen, Inject 7 Units into the skin 2 (two) times daily. , Disp: , Rfl:  .  simvastatin (ZOCOR) 20 MG tablet, Take 20 mg by mouth every evening. , Disp: , Rfl:  .  vitamin B-12 (CYANOCOBALAMIN) 1000 MCG tablet, Take 1,000 mcg by mouth daily., Disp: , Rfl:  .  acetaminophen-codeine (TYLENOL #4) 300-60 MG tablet, Take 1 tablet by mouth 2 (two) times daily as needed for moderate pain.  (Patient not taking: Reported on 03/01/2020), Disp: , Rfl:  .   albuterol (PROVENTIL) (2.5 MG/3ML) 0.083% nebulizer solution, Inhale 2.5 mg into the lungs every 6 (six) hours as needed for shortness of breath.  (Patient not taking: Reported on 03/01/2020), Disp: , Rfl:  .  cephALEXin (KEFLEX) 250 MG capsule, Take 1 capsule (250 mg total) by mouth 2 (two) times daily for 7 days., Disp: 14 capsule, Rfl: 0 .  diflorasone (PSORCON) 0.05 % ointment, Apply 1 application topically as directed.  (Patient not taking: Reported on 03/01/2020), Disp: , Rfl:  .  ELDERBERRY PO, Take 1 tablet by mouth daily. (Patient not taking: Reported on 03/01/2020), Disp: , Rfl:  .  ferrous sulfate 325 (65 FE) MG tablet, Take 325 mg by mouth 2 (two) times daily with a meal. (Patient not taking: Reported on 03/01/2020), Disp: , Rfl:  .  Fluticasone Propionate, Inhal, (FLOVENT DISKUS) 100 MCG/BLIST AEPB, Inhale 2 puffs into the lungs 2 (two) times daily as needed (respiratory problems).  (Patient not taking: Reported on 03/01/2020), Disp: , Rfl:  .  loperamide (IMODIUM A-D) 2 MG capsule, Take 2-4 mg by mouth 4 (four) times daily as needed for diarrhea or loose stools.  (Patient not taking: Reported on 03/01/2020), Disp: , Rfl:  .  oxyCODONE-acetaminophen (PERCOCET/ROXICET) 5-325 MG tablet, Take 1 tablet by mouth daily as needed for severe pain. (Patient not taking: Reported on 03/01/2020), Disp: , Rfl:  No current facility-administered medications for this visit.  Facility-Administered Medications Ordered in Other Visits:  .  sodium chloride flush (NS) 0.9 % injection 10 mL, 10 mL, Intravenous, PRN, Cammie Sickle, MD, 10 mL at 01/30/16 1054  Physical exam:  Vitals:   03/01/20 1344  BP: 93/66  Pulse: 73  Resp: 20  Temp: (!) 97.3 F (36.3 C)  TempSrc: Tympanic  Weight: 189 lb (85.7 kg)  Height: 5\' 3"  (1.6 m)   Physical Exam Constitutional:      Appearance: She is well-developed.     Comments: Wheelchair. Fatigued appearing  HENT:     Head: Atraumatic.     Mouth/Throat:      Pharynx: No oropharyngeal exudate.  Eyes:     General: No scleral icterus.    Conjunctiva/sclera: Conjunctivae normal.  Cardiovascular:     Rate and Rhythm: Normal rate and regular rhythm.  Pulmonary:     Effort: Pulmonary effort is normal.     Breath sounds: Normal breath sounds.  Abdominal:     General: Bowel sounds are normal. There is no distension.     Palpations: Abdomen is soft.     Tenderness: There is no abdominal tenderness.  Musculoskeletal:        General: Normal range of motion.     Cervical back: Normal range of motion and neck supple.     Right lower leg: Edema present.     Left lower leg: Edema present.  Skin:    General: Skin is warm and dry.     Findings: Lesion (abrasion left knee) present.  Neurological:     Mental Status: She is alert and oriented to person, place, and time. Mental status is at baseline.  Psychiatric:        Mood and Affect: Mood normal.        Behavior: Behavior normal.      CMP Latest Ref Rng & Units 03/01/2020  Glucose 70 - 99 mg/dL 444(H)  BUN 8 - 23 mg/dL 37(H)  Creatinine 0.44 - 1.00 mg/dL 3.16(H)  Sodium 135 - 145 mmol/L 130(L)  Potassium 3.5 - 5.1 mmol/L 4.2  Chloride 98 - 111 mmol/L 98  CO2 22 - 32 mmol/L 23  Calcium 8.9 - 10.3 mg/dL 8.5(L)  Total Protein 6.5 - 8.1 g/dL 6.8  Total Bilirubin 0.3 - 1.2 mg/dL 0.6  Alkaline Phos 38 - 126 U/L 75  AST 15 - 41 U/L 13(L)  ALT 0 - 44 U/L 14   CBC Latest Ref Rng & Units 03/01/2020  WBC 4.0 - 10.5 K/uL 6.6  Hemoglobin 12.0 - 15.0 g/dL 8.7(L)  Hematocrit 36 - 46 % 24.8(L)  Platelets 150 - 400 K/uL 342    No images are attached to the encounter.  CT Head Wo Contrast  Result Date: 03/01/2020 CLINICAL DATA:  Head trauma fall metastatic breast cancer EXAM: CT HEAD WITHOUT CONTRAST TECHNIQUE: Contiguous axial images were obtained from the base of the skull through the vertex without intravenous contrast. COMPARISON:  PET CT 12/12/2019 FINDINGS: Brain: No acute territorial infarction,  hemorrhage, or intracranial mass. Minimal white matter hypodensity on the right. Nonenlarged ventricles. Vascular: No hyperdense vessels.  No unexpected calcification Skull: No fracture Sinuses/Orbits: No acute finding. Other: None IMPRESSION: 1. No CT evidence for acute intracranial abnormality. 2. Minimal white matter hypodensity, may reflect small vessel ischemic change. Electronically Signed   By: Donavan Foil M.D.   On: 03/01/2020 18:45   CT Cervical Spine Wo Contrast  Result Date: 03/01/2020  CLINICAL DATA:  Fall EXAM: CT CERVICAL SPINE WITHOUT CONTRAST TECHNIQUE: Multidetector CT imaging of the cervical spine was performed without intravenous contrast. Multiplanar CT image reconstructions were also generated. COMPARISON:  None. FINDINGS: Alignment: Mild reversal of cervical lordosis. No subluxation. Facet alignment within normal limits. Skull base and vertebrae: No acute fracture. No primary bone lesion or focal pathologic process. Soft tissues and spinal canal: No prevertebral fluid or swelling. No visible canal hematoma. Disc levels:  Mild degenerative changes at C5-C6 and C6-C7. Upper chest: Negative. Other: None IMPRESSION: Mild reversal of cervical lordosis. No acute osseous abnormality. Electronically Signed   By: Donavan Foil M.D.   On: 03/01/2020 18:48    Assessment and plan- Patient is a 64 y.o. female with metastatic breast cancer currently receiving rib 1.  Presents to symptom management clinic for weakness, falls, hyperglycemia and hypertension.   1.  Weakness-likely secondary to general deconditioning with metastatic breast cancer.  Would likely benefit from UA to rule out UTI in setting of hyperglycemia.   2.  Hyperglycemia-blood sugar > 400 in clinic.  Reports compliance with medications.  Not currently on steroids.  Question fluid volume deficit in setting of hypertension.  Would likely benefit from IV fluids.  3.  Falls-secondary to weakness and general deconditioning.   Recommend CT imaging of head and C-spine.  Can consider PT and OT evaluation.  4.  Anemia-hemoglobin 8.7.  Likely secondary to treatment though with recent fall cannot rule out bleed.   Findings discussed with Dr. Rogue Bussing who recommends transfer to ER for further evaluation and work-up.  Patient agreeable.  Nursing to transport.   Visit Diagnosis 1. Fall in home, initial encounter   2. Weakness generalized   3. Antineoplastic chemotherapy induced anemia   4. Acute hyperglycemia     Patient expressed understanding and was in agreement with this plan. She also understands that She can call clinic at any time with any questions, concerns, or complaints.   Thank you for allowing me to participate in the care of this very pleasant patient.   Beckey Rutter, DNP, AGNP-C [place] [facility] [place]

## 2020-03-15 ENCOUNTER — Other Ambulatory Visit: Payer: Self-pay

## 2020-03-15 ENCOUNTER — Encounter: Payer: Self-pay | Admitting: Internal Medicine

## 2020-03-15 NOTE — Progress Notes (Signed)
Patient prescribed Keflex by Dr. Ginette Pitman due to wound on right foot. She was unable to obtain an apt in the wound care center until after 04/12/20.

## 2020-03-16 ENCOUNTER — Inpatient Hospital Stay (HOSPITAL_BASED_OUTPATIENT_CLINIC_OR_DEPARTMENT_OTHER): Payer: Medicare HMO | Admitting: Internal Medicine

## 2020-03-16 ENCOUNTER — Encounter: Payer: Self-pay | Admitting: Internal Medicine

## 2020-03-16 ENCOUNTER — Inpatient Hospital Stay: Payer: Medicare HMO | Attending: Internal Medicine

## 2020-03-16 ENCOUNTER — Inpatient Hospital Stay: Payer: Medicare HMO

## 2020-03-16 DIAGNOSIS — M25569 Pain in unspecified knee: Secondary | ICD-10-CM | POA: Diagnosis not present

## 2020-03-16 DIAGNOSIS — C50212 Malignant neoplasm of upper-inner quadrant of left female breast: Secondary | ICD-10-CM | POA: Diagnosis not present

## 2020-03-16 DIAGNOSIS — D631 Anemia in chronic kidney disease: Secondary | ICD-10-CM | POA: Insufficient documentation

## 2020-03-16 DIAGNOSIS — N184 Chronic kidney disease, stage 4 (severe): Secondary | ICD-10-CM | POA: Insufficient documentation

## 2020-03-16 DIAGNOSIS — C7951 Secondary malignant neoplasm of bone: Secondary | ICD-10-CM | POA: Insufficient documentation

## 2020-03-16 DIAGNOSIS — Z87891 Personal history of nicotine dependence: Secondary | ICD-10-CM | POA: Diagnosis not present

## 2020-03-16 DIAGNOSIS — Z800 Family history of malignant neoplasm of digestive organs: Secondary | ICD-10-CM | POA: Insufficient documentation

## 2020-03-16 DIAGNOSIS — Z833 Family history of diabetes mellitus: Secondary | ICD-10-CM | POA: Insufficient documentation

## 2020-03-16 DIAGNOSIS — Z808 Family history of malignant neoplasm of other organs or systems: Secondary | ICD-10-CM | POA: Insufficient documentation

## 2020-03-16 DIAGNOSIS — Z8042 Family history of malignant neoplasm of prostate: Secondary | ICD-10-CM | POA: Diagnosis not present

## 2020-03-16 DIAGNOSIS — R5383 Other fatigue: Secondary | ICD-10-CM | POA: Insufficient documentation

## 2020-03-16 DIAGNOSIS — Z8041 Family history of malignant neoplasm of ovary: Secondary | ICD-10-CM | POA: Diagnosis not present

## 2020-03-16 DIAGNOSIS — M549 Dorsalgia, unspecified: Secondary | ICD-10-CM | POA: Diagnosis not present

## 2020-03-16 DIAGNOSIS — M2550 Pain in unspecified joint: Secondary | ICD-10-CM | POA: Insufficient documentation

## 2020-03-16 DIAGNOSIS — M1710 Unilateral primary osteoarthritis, unspecified knee: Secondary | ICD-10-CM | POA: Insufficient documentation

## 2020-03-16 DIAGNOSIS — Z806 Family history of leukemia: Secondary | ICD-10-CM | POA: Diagnosis not present

## 2020-03-16 DIAGNOSIS — Z836 Family history of other diseases of the respiratory system: Secondary | ICD-10-CM | POA: Insufficient documentation

## 2020-03-16 DIAGNOSIS — Z170 Estrogen receptor positive status [ER+]: Secondary | ICD-10-CM

## 2020-03-16 DIAGNOSIS — Z79899 Other long term (current) drug therapy: Secondary | ICD-10-CM | POA: Insufficient documentation

## 2020-03-16 DIAGNOSIS — Z803 Family history of malignant neoplasm of breast: Secondary | ICD-10-CM | POA: Insufficient documentation

## 2020-03-16 DIAGNOSIS — Z8249 Family history of ischemic heart disease and other diseases of the circulatory system: Secondary | ICD-10-CM | POA: Diagnosis not present

## 2020-03-16 DIAGNOSIS — Z8673 Personal history of transient ischemic attack (TIA), and cerebral infarction without residual deficits: Secondary | ICD-10-CM | POA: Diagnosis not present

## 2020-03-16 LAB — CBC WITH DIFFERENTIAL/PLATELET
Abs Immature Granulocytes: 0.13 10*3/uL — ABNORMAL HIGH (ref 0.00–0.07)
Basophils Absolute: 0.1 10*3/uL (ref 0.0–0.1)
Basophils Relative: 0 %
Eosinophils Absolute: 0.1 10*3/uL (ref 0.0–0.5)
Eosinophils Relative: 0 %
HCT: 25.1 % — ABNORMAL LOW (ref 36.0–46.0)
Hemoglobin: 8.7 g/dL — ABNORMAL LOW (ref 12.0–15.0)
Immature Granulocytes: 1 %
Lymphocytes Relative: 6 %
Lymphs Abs: 0.7 10*3/uL (ref 0.7–4.0)
MCH: 31.2 pg (ref 26.0–34.0)
MCHC: 34.7 g/dL (ref 30.0–36.0)
MCV: 90 fL (ref 80.0–100.0)
Monocytes Absolute: 0.9 10*3/uL (ref 0.1–1.0)
Monocytes Relative: 7 %
Neutro Abs: 11.1 10*3/uL — ABNORMAL HIGH (ref 1.7–7.7)
Neutrophils Relative %: 86 %
Platelets: 355 10*3/uL (ref 150–400)
RBC: 2.79 MIL/uL — ABNORMAL LOW (ref 3.87–5.11)
RDW: 13.7 % (ref 11.5–15.5)
WBC: 12.9 10*3/uL — ABNORMAL HIGH (ref 4.0–10.5)
nRBC: 0 % (ref 0.0–0.2)

## 2020-03-16 LAB — COMPREHENSIVE METABOLIC PANEL
ALT: 11 U/L (ref 0–44)
AST: 15 U/L (ref 15–41)
Albumin: 3 g/dL — ABNORMAL LOW (ref 3.5–5.0)
Alkaline Phosphatase: 75 U/L (ref 38–126)
Anion gap: 12 (ref 5–15)
BUN: 36 mg/dL — ABNORMAL HIGH (ref 8–23)
CO2: 22 mmol/L (ref 22–32)
Calcium: 8.4 mg/dL — ABNORMAL LOW (ref 8.9–10.3)
Chloride: 99 mmol/L (ref 98–111)
Creatinine, Ser: 2.88 mg/dL — ABNORMAL HIGH (ref 0.44–1.00)
GFR calc Af Amer: 19 mL/min — ABNORMAL LOW (ref >60)
GFR calc non Af Amer: 17 mL/min — ABNORMAL LOW (ref >60)
Glucose, Bld: 211 mg/dL — ABNORMAL HIGH (ref 70–99)
Potassium: 4.4 mmol/L (ref 3.5–5.1)
Sodium: 133 mmol/L — ABNORMAL LOW (ref 135–145)
Total Bilirubin: 0.5 mg/dL (ref 0.3–1.2)
Total Protein: 7 g/dL (ref 6.5–8.1)

## 2020-03-16 MED ORDER — HEPARIN SOD (PORK) LOCK FLUSH 100 UNIT/ML IV SOLN
500.0000 [IU] | Freq: Once | INTRAVENOUS | Status: AC
  Administered 2020-03-16: 500 [IU]
  Filled 2020-03-16: qty 5

## 2020-03-16 MED ORDER — HEPARIN SOD (PORK) LOCK FLUSH 100 UNIT/ML IV SOLN
INTRAVENOUS | Status: AC
  Filled 2020-03-16: qty 5

## 2020-03-16 MED ORDER — SODIUM CHLORIDE 0.9% FLUSH
10.0000 mL | Freq: Once | INTRAVENOUS | Status: AC
  Administered 2020-03-16: 10 mL via INTRAVENOUS
  Filled 2020-03-16: qty 10

## 2020-03-16 NOTE — Progress Notes (Signed)
[place] OFFICE PROGRESS NOTE  Patient Care Team: [name], [name], MD as Medical Oncologist (Medical Oncology) Corey Skains, MD as Consulting Physician (Cardiology)  Cancer Staging No matching staging information was found for the patient.   Oncology History Overview Note  # OCT 2015-STAGE IV LEFT BREAST T2N1 [T=4cm; [name]; [name]; [name]; EBUS- Positive Paratrac/subcarinal LN s/p ? Taxotere [in [place]; [name]; [name];[name]; [name]; suv 3]; [name]; [name]; [name];  [name]; Ant R iliac bone; [name]; [name]; START-Taxol weekly. March 2020- Taxol every 2 weeks [PN]; SEP 2020- PET progression  # SEP 04/29/2019- ERIBULIN s/p RT - Right hip- [s/p RT- NOV 2020]  # Poorly controlled Blood sugars- improved.   # Pancreatitis Hx/PEI- on creon in past / CKD IV [creat ~ 3-4; [name]; [name]; mild left sided weakness]  # Jan 2020-  Lobular lesion on tongue- s/p excision pyogenic granuloma [Dr.McQueen]   # GENETIC TESTING/COUNSELLING: HETEROZYGOUS Cystic Fibrosis Gene [explains hx of recurrent pancreatitis]  # MOLECULAR TESTING: NA   # PALLIATIVE CARE: 1/22-Discussed/Declined ------------------------------------------------   DIAGNOSIS: [ 2015] BREAST CA; [name]; [name] ;GOALS: Palliative  CURRENT/MOST RECENT THERAPY: ERIBULIN [C].     Carcinoma of upper-inner quadrant of left  breast in female, estrogen receptor positive ([place])  04/29/2019 -  Chemotherapy   The patient had eriBULin mesylate (HALAVEN) 2 mg in sodium chloride 0.9 % 100 mL chemo infusion, 2 mg, Intravenous,  Once, 12 of 14 cycles Dose modification: 1 mg/m2 (original dose 1 mg/m2, Cycle 1, Reason: Provider Judgment) Administration: 2 mg (06/03/2019), 2 mg (04/29/2019), 2 mg (06/10/2019), 2 mg (07/04/2019), 2 mg (07/11/2019), 2 mg (07/25/2019), 2 mg (08/03/2019), 2 mg (08/19/2019), 2 mg (08/26/2019), 2 mg (09/09/2019), 2 mg (09/16/2019), 2 mg (10/07/2019), 2 mg (10/14/2019), 2 mg (10/28/2019), 2 mg (11/04/2019), 2 mg (11/28/2019), 2 mg (12/09/2019), 2 mg (01/13/2020), 2 mg (01/20/2020), 2 mg (02/02/2020), 2 mg (02/13/2020), 2 mg (02/24/2020)  for chemotherapy treatment.     INTERVAL HISTORY:  [name] 64 y.o.  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  In the interim patient 1 week ago was evaluated in the symptomatic med clinic/sent over to the emergency room-poorly controlled blood sugars/borderline low blood pressures.  Question UTI discharged home on antibiotics.  Patient noted to have infection of the left foot for which she received 2 rounds of antibiotics.  Notes some mild improvement.  Not Resolved.  Review of Systems  Constitutional: Positive for malaise/fatigue. Negative for chills, diaphoresis, [name], hemoptysis, sputum production, [name], palpitations and orthopnea.  Gastrointestinal: Negative for abdominal pain, blood in stool, constipation, diarrhea, heartburn, melena, nausea and vomiting.  Genitourinary: Negative for dysuria,  frequency and urgency.  Musculoskeletal: Positive for back pain and  joint pain.  Skin: Negative.  Negative for itching and rash.  Neurological: Positive for tingling. Negative for dizziness, focal weakness, weakness and headaches.  Endo/Heme/Allergies: Does not bruise/bleed easily.  Psychiatric/Behavioral: Negative for depression. The patient is not nervous/anxious and does not have insomnia.     PAST MEDICAL HISTORY :  Past Medical History:  Diagnosis Date  . Anemia   . Anxiety   . Asthma   . Cancer ([place]) 03/10/2018   Per NM PET order. Carcinoma of upper-inner quadrant of left breast in female, estrogen receptor positive .  Marland Kitchen Cancer (HCC)    LUNG  . CHF (congestive heart failure) ([place]) 1997  . CKD (chronic kidney disease)   . Depression   . Diabetes mellitus, type 2 ([place])   . Family history of breast cancer   . Family history of colon cancer   . Family history of ovarian cancer   . Family history of pancreatic cancer   . Family history of prostate cancer   . Family history of stomach cancer   . GERD (gastroesophageal reflux disease)    history of an ulcer  . Hair loss   . History of left breast cancer 05/29/14  . History of partial hysterectomy 12/31/2016   Per patient.  Has not had a period in years.  Had a partial hysterectomy years ago.  Marland Kitchen Hypertension   . Mitral valve regurgitation   . Neuromuscular disorder (HCC)    neuropathies in hand  . Obesity   . Pancreatitis 1997  . Stroke [facility]) 2010   with mild left arm weakness    PAST SURGICAL HISTORY :   Past Surgical History:  Procedure Laterality Date  . CATARACT EXTRACTION W/PHACO Right 02/24/2019   Procedure: CATARACT EXTRACTION PHACO AND INTRAOCULAR LENS PLACEMENT ([place]) RIGHT DIABETES;  Surgeon: Marchia Meiers, MD;  Location: [facility] ORS;  Service: Ophthalmology;  Laterality: Right;  [place] 01:13.0 CDE 7.96 Fluid Pack Lot # [id number]  . CATARACT EXTRACTION W/PHACO Left 03/24/2019   Procedure: CATARACT EXTRACTION PHACO AND INTRAOCULAR LENS PLACEMENT ([facility]) - left diabetic;  Surgeon: Marchia Meiers, MD;  Location: [facility] ORS;  Service: Ophthalmology;  Laterality: Left;  [place]  01:36 CDE 13.93 Fluid pack lot # [id number]  . CESAREAN SECTION    . CHOLECYSTECTOMY    . EXCISION OF TONGUE LESION N/A 08/17/2018   Procedure: EXCISION OF TONGUE LESION WITH FROZEN SECTIONS;  Surgeon: Beverly Gust, MD;  Location: [facility] ORS;  Service: ENT;  Laterality: N/A;  . EYE SURGERY Right    cataract extraction  . PARTIAL HYSTERECTOMY  12/31/2016   Per patient, she has not had a period in years since she had a partial hysterectomy.  Marland Kitchen PORTA CATH INSERTION    . TUBAL LIGATION      FAMILY HISTORY :   Family History  Problem Relation Age of Onset  . Ovarian cancer Mother 74  . Diabetes Mother   . Hypertension Mother   . COPD Father   . Hypertension Father   . Colon cancer Father 73  . Diabetes Sister   . Breast cancer Sister 17       bilateral  . Diabetes Brother   . Leukemia Maternal Aunt   . Pancreatic cancer Paternal Aunt 57  . Pancreatic cancer Paternal Uncle   . Colon cancer Paternal Uncle   . Stomach cancer Maternal Grandfather 69  . Throat cancer Paternal Grandmother   . Breast cancer Maternal  Aunt 80  . Colon cancer Maternal Aunt   . Bone cancer Maternal Aunt   . Breast cancer Paternal Aunt        dx >50  . Prostate cancer Paternal Uncle   . Pancreatic cancer Paternal Uncle   . Throat cancer Paternal Uncle   . Lung cancer Paternal Uncle   . Stomach cancer Paternal Uncle   . Brain cancer Paternal Aunt   . Cancer Cousin        liver, kidney  . Prostate cancer Cousin        meastatic  . Lung cancer Other     SOCIAL HISTORY:   Social History   Tobacco Use  . Smoking status: Former Smoker    Packs/day: 0.50    Years: 1.00    Pack years: 0.50    Types: Cigarettes  . Smokeless tobacco: Never Used  Vaping Use  . Vaping Use: Never used  Substance Use Topics  . Alcohol use: No    Alcohol/week: 0.0 standard drinks  . Drug use: No    ALLERGIES:  is allergic to  fish-derived products and sulfamethoxazole-trimethoprim.  MEDICATIONS:  Current Outpatient Medications  Medication Sig Dispense Refill  . albuterol (PROAIR HFA) 108 (90 BASE) MCG/ACT inhaler Inhale 2 puffs into the lungs every 6 (six) hours as needed for wheezing or shortness of breath.     . ALPRAZolam (XANAX) 0.5 MG tablet Take 0.5 mg by mouth 2 (two) times daily as needed for anxiety or sleep.     Marland Kitchen amLODipine (NORVASC) 10 MG tablet Take 10 mg by mouth daily.     Marland Kitchen aspirin EC 81 MG tablet Take 81 mg by mouth daily.     Marland Kitchen atenolol (TENORMIN) 100 MG tablet Take 100 mg by mouth 2 (two) times daily.     . bumetanide (BUMEX) 0.5 MG tablet Take 0.5 mg by mouth 2 (two) times daily.     . calcitRIOL (ROCALTROL) 0.25 MCG capsule Take 0.25 mcg by mouth daily.     . cephALEXin (KEFLEX) 500 MG capsule Take 1 capsule by mouth in the morning, at noon, and at bedtime.    . Cinnamon 500 MG capsule Take 500 mg by mouth 2 (two) times daily.     . cloNIDine (CATAPRES) 0.2 MG tablet Take 0.2 mg by mouth 2 (two) times daily.     . enalapril (VASOTEC) 10 MG tablet Take 20 mg by mouth 2 (two) times a day.     . famotidine (PEPCID) 20 MG tablet Take 20 mg by mouth 2 (two) times daily.     Marland Kitchen FLUoxetine (PROZAC) 20 MG capsule Take 20 mg by mouth 2 (two) times daily.     Marland Kitchen gabapentin (NEURONTIN) 100 MG capsule TAKE 1 CAPSULE(100 MG) BY MOUTH AT BEDTIME 30 capsule 6  . glyBURIDE (DIABETA) 5 MG tablet Take 10 mg by mouth 2 (two) times daily with a meal.     . LEVEMIR FLEXTOUCH 100 UNIT/ML Pen Inject 55 Units into the skin daily.     Marland Kitchen NOVOLOG FLEXPEN 100 UNIT/ML FlexPen Inject 7 Units into the skin 2 (two) times daily.     . simvastatin (ZOCOR) 20 MG tablet Take 20 mg by mouth every evening.     . vitamin B-12 (CYANOCOBALAMIN) 1000 MCG tablet Take 1,000 mcg by mouth daily.    Marland Kitchen acetaminophen-codeine (TYLENOL #4) 300-60 MG tablet Take 1 tablet by mouth 2 (two) times daily as needed for moderate pain.  (Patient not  taking: Reported on 03/01/2020)    . albuterol (PROVENTIL) (2.5 MG/3ML) 0.083% nebulizer solution Inhale 2.5 mg into the lungs every 6 (six) hours as needed for shortness of breath.  (Patient not taking: Reported on 03/01/2020)    . diflorasone (PSORCON) 0.05 % ointment Apply 1 application topically as directed.  (Patient not taking: Reported on 03/01/2020)    . ELDERBERRY PO Take 1 tablet by mouth daily. (Patient not taking: Reported on 03/01/2020)    . ferrous sulfate 325 (65 FE) MG tablet Take 325 mg by mouth 2 (two) times daily with a meal. (Patient not taking: Reported on 03/01/2020)    . Fluticasone Propionate, Inhal, (FLOVENT DISKUS) 100 MCG/BLIST AEPB Inhale 2 puffs into the lungs 2 (two) times daily as needed (respiratory problems).  (Patient not taking: Reported on 03/01/2020)    . loperamide (IMODIUM A-D) 2 MG capsule Take 2-4 mg by mouth 4 (four) times daily as needed for diarrhea or loose stools.  (Patient not taking: Reported on 03/01/2020)    . oxyCODONE-acetaminophen (PERCOCET/ROXICET) 5-325 MG tablet Take 1 tablet by mouth daily as needed for severe pain. (Patient not taking: Reported on 03/01/2020)     No current facility-administered medications for this visit.   Facility-Administered Medications Ordered in Other Visits  Medication Dose Route Frequency Provider Last Rate Last Admin  . sodium chloride flush (NS) 0.9 % injection 10 mL  10 mL Intravenous PRN Cammie Sickle, MD   10 mL at 01/30/16 1054    PHYSICAL EXAMINATION: ECOG PERFORMANCE STATUS: 1 - Symptomatic but completely ambulatory  BP 116/73 (BP Location: Right Arm, Patient Position: Sitting, Cuff Size: Normal)   Pulse 73   Temp (!) 97.1 F (36.2 C) (Tympanic)   Resp 16   Ht 5' 3" (1.6 m)   Wt 194 lb (88 kg)   SpO2 100%   BMI 34.37 kg/m   Filed Weights   03/16/20 0827  Weight: 194 lb (88 kg)    Physical Exam Constitutional:      Comments: She is alone.  HENT:     Head: Normocephalic and atraumatic.      Mouth/Throat:     Pharynx: No oropharyngeal exudate.  Eyes:     Pupils: Pupils are equal, round, and reactive to light.  Cardiovascular:     Rate and Rhythm: Normal rate and regular rhythm.  Pulmonary:     Effort: No respiratory distress.     Breath sounds: Normal breath sounds. No wheezing.  Abdominal:     General: Bowel sounds are normal. There is no distension.     Palpations: Abdomen is soft. There is no mass.     Tenderness: There is no abdominal tenderness. There is no guarding or rebound.  Musculoskeletal:        General: No tenderness. Normal range of motion.     Cervical back: Normal range of motion and neck supple.  Skin:    General: Skin is warm.     Comments: Left sole-ulceration noted/see picture below.  Neurological:     Mental Status: She is alert and oriented to person, place, and time.  Psychiatric:        Mood and Affect: Affect normal.     LABORATORY DATA:  I have reviewed the data as listed    Component Value Date/Time   NA 133 (L) 03/16/2020 0811   NA 130 (L) 06/06/2014 1102   K 4.4 03/16/2020 0811   K 3.9 06/06/2014 1102   CL 99 03/16/2020 0811  CL 95 (L) 06/06/2014 1102   CO2 22 03/16/2020 0811   CO2 28 06/06/2014 1102   GLUCOSE 211 (H) 03/16/2020 0811   GLUCOSE 349 (H) 06/06/2014 1102   BUN 36 (H) 03/16/2020 0811   BUN 17 06/06/2014 1102   CREATININE 2.88 (H) 03/16/2020 0811   CREATININE 1.63 (H) 06/06/2014 1102   CALCIUM 8.4 (L) 03/16/2020 0811   CALCIUM 9.2 06/06/2014 1102   PROT 7.0 03/16/2020 0811   PROT 8.2 06/06/2014 1102   ALBUMIN 3.0 (L) 03/16/2020 0811   ALBUMIN 3.3 (L) 06/06/2014 1102   AST 15 03/16/2020 0811   AST 7 (L) 06/06/2014 1102   ALT 11 03/16/2020 0811   ALT 12 (L) 06/06/2014 1102   ALKPHOS 75 03/16/2020 0811   ALKPHOS 74 06/06/2014 1102   BILITOT 0.5 03/16/2020 0811   BILITOT 0.4 06/06/2014 1102   GFRNONAA 17 (L) 03/16/2020 0811   GFRNONAA 35 (L) 06/06/2014 1102   GFRAA 19 (L) 03/16/2020 0811   GFRAA 42 (L)  06/06/2014 1102    No results found for: SPEP, UPEP  Lab Results  Component Value Date   WBC 12.9 (H) 03/16/2020   NEUTROABS 11.1 (H) 03/16/2020   HGB 8.7 (L) 03/16/2020   HCT 25.1 (L) 03/16/2020   MCV 90.0 03/16/2020   PLT 355 03/16/2020      Chemistry      Component Value Date/Time   NA 133 (L) 03/16/2020 0811   NA 130 (L) 06/06/2014 1102   K 4.4 03/16/2020 0811   K 3.9 06/06/2014 1102   CL 99 03/16/2020 0811   CL 95 (L) 06/06/2014 1102   CO2 22 03/16/2020 0811   CO2 28 06/06/2014 1102   BUN 36 (H) 03/16/2020 0811   BUN 17 06/06/2014 1102   CREATININE 2.88 (H) 03/16/2020 0811   CREATININE 1.63 (H) 06/06/2014 1102      Component Value Date/Time   CALCIUM 8.4 (L) 03/16/2020 0811   CALCIUM 9.2 06/06/2014 1102   ALKPHOS 75 03/16/2020 0811   ALKPHOS 74 06/06/2014 1102   AST 15 03/16/2020 0811   AST 7 (L) 06/06/2014 1102   ALT 11 03/16/2020 0811   ALT 12 (L) 06/06/2014 1102   BILITOT 0.5 03/16/2020 0811   BILITOT 0.4 06/06/2014 1102         RADIOGRAPHIC STUDIES: I have personally reviewed the radiological images as listed and agreed with the findings in the report. No results found.   ASSESSMENT & PLAN:  Carcinoma of upper-inner quadrant of left breast in female, estrogen receptor positive ([place]) Left breast cancer- stage IV- ER/PR +, HER-2/neu. Currently on eribulin [renally dosed [start at 1 mg/m] started on-04/29/2019.   PET scan MAY 10th, 2021-STABLE; Partial response/ uptake pleural-based lesion/lung lesions/left breast mass; but Ca-27-29- Rising. [discussed with pt];STABLE.   # HOLD eribulin today; Labs today reviewed;  acceptable for treatment today. Discussed re: elevated TM; discrepancy with imaging; will order imaging today  # Left foot infection- NEW; Hx risk of complications s/p 2 rounds anti-biotcs- recommend urgent evaluation with wound care.  Discussed with wound care; patient has appointment next week.  # Bone mets-sclerotic; Right acetabular  uptake-s/p radiation. Hypocalcemia-ca-8.3/low vitD- on vit D daily. STABLE.   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   # Chronic kidney disease - stage IV-GFR 19; STABLE: [[name]  # Anemia- hemoglobin today-8-9/CKD-STABLE; On PO iron.  # DISPOSITION:  #  HOLD  chemo today #  Follow up in 2 weeks; /MD-labs- cbc/cmp-ca-27-29; Ca-15-3; CEA- Eribulin; PET prior- Dr.B   Orders Placed This Encounter  Procedures  . NM PET Image Restag (PS) Skull Base To Thigh    Standing Status:   Future    Standing Expiration Date:   03/16/2021    Order Specific Question:   If indicated for the ordered procedure, I authorize the administration of a radiopharmaceutical per Radiology protocol    Answer:   Yes    Order Specific Question:   Preferred imaging location?    Answer:   [facility]    Order Specific Question:   Radiology Contrast Protocol - do NOT remove file path    Answer:   [email or web address]  . CBC with Differential    Standing Status:   Future    Standing Expiration Date:   03/16/2021  . Comprehensive metabolic panel    Standing Status:   Future    Standing Expiration Date:   03/16/2021  . Cancer antigen 27.29    Standing Status:   Future    Standing Expiration Date:   03/16/2021  . Cancer antigen 15-3    Standing Status:   Future    Standing Expiration Date:   03/16/2021  . CEA    Standing Status:   Future    Standing Expiration Date:   03/16/2021   All questions were answered. The patient knows to call the clinic with any problems, questions or concerns.      Cammie Sickle, MD 03/18/2020 4:33 PM

## 2020-03-16 NOTE — Assessment & Plan Note (Addendum)
Left breast cancer- stage IV- ER/PR +, HER-2/neu. Currently on eribulin [renally dosed [start at 1 mg/m] started on-04/29/2019.   PET scan MAY 10th, 2021-STABLE; Partial response/ uptake pleural-based lesion/lung lesions/left breast mass; but Ca-27-29- Rising. [discussed with pt];STABLE.   # HOLD eribulin today; Labs today reviewed;  acceptable for treatment today. Discussed re: elevated TM; discrepancy with imaging; will order imaging today  # Left foot infection- NEW; Hx risk of complications s/p 2 rounds anti-biotcs- recommend urgent evaluation with wound care.  Discussed with wound care; patient has appointment next week.  # Bone mets-sclerotic; Right acetabular uptake-s/p radiation. Hypocalcemia-ca-8.3/low vitD- on vit D daily. STABLE.   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   # Chronic kidney disease - stage IV-GFR 19; STABLE: [[name]  # Anemia- hemoglobin today-8-9/CKD-STABLE; On PO iron.  # DISPOSITION:  #  HOLD chemo today #  Follow up in 2 weeks; /MD-labs- cbc/cmp-ca-27-29; Ca-15-3; CEA- Eribulin; PET prior- Dr.B

## 2020-03-17 LAB — CANCER ANTIGEN 27.29: CA 27.29: 119.6 U/mL — ABNORMAL HIGH (ref 0.0–38.6)

## 2020-03-17 LAB — CANCER ANTIGEN 15-3: CA 15-3: 79.2 U/mL — ABNORMAL HIGH (ref 0.0–25.0)

## 2020-03-17 LAB — CEA: CEA: 23.6 ng/mL — ABNORMAL HIGH (ref 0.0–4.7)

## 2020-03-19 DIAGNOSIS — D631 Anemia in chronic kidney disease: Secondary | ICD-10-CM | POA: Diagnosis not present

## 2020-03-19 DIAGNOSIS — E1122 Type 2 diabetes mellitus with diabetic chronic kidney disease: Secondary | ICD-10-CM | POA: Diagnosis not present

## 2020-03-19 DIAGNOSIS — R809 Proteinuria, unspecified: Secondary | ICD-10-CM | POA: Diagnosis not present

## 2020-03-19 DIAGNOSIS — N185 Chronic kidney disease, stage 5: Secondary | ICD-10-CM | POA: Diagnosis not present

## 2020-03-19 DIAGNOSIS — I120 Hypertensive chronic kidney disease with stage 5 chronic kidney disease or end stage renal disease: Secondary | ICD-10-CM | POA: Diagnosis not present

## 2020-03-19 DIAGNOSIS — E871 Hypo-osmolality and hyponatremia: Secondary | ICD-10-CM | POA: Diagnosis not present

## 2020-03-19 DIAGNOSIS — N2581 Secondary hyperparathyroidism of renal origin: Secondary | ICD-10-CM | POA: Diagnosis not present

## 2020-03-20 ENCOUNTER — Ambulatory Visit: Payer: Medicare HMO | Admitting: Physician Assistant

## 2020-03-20 DIAGNOSIS — R0602 Shortness of breath: Secondary | ICD-10-CM | POA: Diagnosis not present

## 2020-03-20 DIAGNOSIS — E782 Mixed hyperlipidemia: Secondary | ICD-10-CM | POA: Diagnosis not present

## 2020-03-20 DIAGNOSIS — I5022 Chronic systolic (congestive) heart failure: Secondary | ICD-10-CM | POA: Diagnosis not present

## 2020-03-20 DIAGNOSIS — Z01818 Encounter for other preprocedural examination: Secondary | ICD-10-CM | POA: Diagnosis not present

## 2020-03-20 DIAGNOSIS — I6523 Occlusion and stenosis of bilateral carotid arteries: Secondary | ICD-10-CM | POA: Diagnosis not present

## 2020-03-22 DIAGNOSIS — E782 Mixed hyperlipidemia: Secondary | ICD-10-CM | POA: Diagnosis not present

## 2020-03-22 DIAGNOSIS — I10 Essential (primary) hypertension: Secondary | ICD-10-CM | POA: Diagnosis not present

## 2020-03-22 DIAGNOSIS — I340 Nonrheumatic mitral (valve) insufficiency: Secondary | ICD-10-CM | POA: Diagnosis not present

## 2020-03-22 DIAGNOSIS — N184 Chronic kidney disease, stage 4 (severe): Secondary | ICD-10-CM | POA: Diagnosis not present

## 2020-03-26 ENCOUNTER — Encounter: Payer: Medicare HMO | Admitting: Physician Assistant

## 2020-03-26 ENCOUNTER — Other Ambulatory Visit: Payer: Self-pay

## 2020-03-26 ENCOUNTER — Other Ambulatory Visit
Admission: RE | Admit: 2020-03-26 | Discharge: 2020-03-26 | Disposition: A | Discharge status: Home / Self Care | Payer: Medicare HMO | Source: Ambulatory Visit | Attending: Physician Assistant | Admitting: Physician Assistant

## 2020-03-26 DIAGNOSIS — E785 Hyperlipidemia, unspecified: Secondary | ICD-10-CM | POA: Diagnosis not present

## 2020-03-26 DIAGNOSIS — I129 Hypertensive chronic kidney disease with stage 1 through stage 4 chronic kidney disease, or unspecified chronic kidney disease: Secondary | ICD-10-CM | POA: Diagnosis not present

## 2020-03-26 DIAGNOSIS — C7951 Secondary malignant neoplasm of bone: Secondary | ICD-10-CM | POA: Diagnosis present

## 2020-03-26 DIAGNOSIS — E1169 Type 2 diabetes mellitus with other specified complication: Secondary | ICD-10-CM | POA: Diagnosis present

## 2020-03-26 DIAGNOSIS — M869 Osteomyelitis, unspecified: Secondary | ICD-10-CM | POA: Diagnosis present

## 2020-03-26 DIAGNOSIS — M86172 Other acute osteomyelitis, left ankle and foot: Secondary | ICD-10-CM | POA: Diagnosis not present

## 2020-03-26 DIAGNOSIS — Z89422 Acquired absence of other left toe(s): Secondary | ICD-10-CM | POA: Diagnosis not present

## 2020-03-26 DIAGNOSIS — M2550 Pain in unspecified joint: Secondary | ICD-10-CM | POA: Diagnosis not present

## 2020-03-26 DIAGNOSIS — Z79899 Other long term (current) drug therapy: Secondary | ICD-10-CM | POA: Diagnosis not present

## 2020-03-26 DIAGNOSIS — L0390 Cellulitis, unspecified: Secondary | ICD-10-CM | POA: Diagnosis not present

## 2020-03-26 DIAGNOSIS — Z833 Family history of diabetes mellitus: Secondary | ICD-10-CM | POA: Insufficient documentation

## 2020-03-26 DIAGNOSIS — J9811 Atelectasis: Secondary | ICD-10-CM | POA: Diagnosis not present

## 2020-03-26 DIAGNOSIS — Z9889 Other specified postprocedural states: Secondary | ICD-10-CM | POA: Diagnosis not present

## 2020-03-26 DIAGNOSIS — C799 Secondary malignant neoplasm of unspecified site: Secondary | ICD-10-CM | POA: Diagnosis not present

## 2020-03-26 DIAGNOSIS — J910 Malignant pleural effusion: Secondary | ICD-10-CM | POA: Diagnosis not present

## 2020-03-26 DIAGNOSIS — A419 Sepsis, unspecified organism: Secondary | ICD-10-CM | POA: Diagnosis present

## 2020-03-26 DIAGNOSIS — L03116 Cellulitis of left lower limb: Secondary | ICD-10-CM | POA: Insufficient documentation

## 2020-03-26 DIAGNOSIS — I130 Hypertensive heart and chronic kidney disease with heart failure and stage 1 through stage 4 chronic kidney disease, or unspecified chronic kidney disease: Secondary | ICD-10-CM | POA: Diagnosis present

## 2020-03-26 DIAGNOSIS — J90 Pleural effusion, not elsewhere classified: Secondary | ICD-10-CM | POA: Diagnosis not present

## 2020-03-26 DIAGNOSIS — I330 Acute and subacute infective endocarditis: Secondary | ICD-10-CM | POA: Diagnosis not present

## 2020-03-26 DIAGNOSIS — K219 Gastro-esophageal reflux disease without esophagitis: Secondary | ICD-10-CM | POA: Diagnosis not present

## 2020-03-26 DIAGNOSIS — D631 Anemia in chronic kidney disease: Secondary | ICD-10-CM | POA: Diagnosis not present

## 2020-03-26 DIAGNOSIS — I10 Essential (primary) hypertension: Secondary | ICD-10-CM | POA: Diagnosis not present

## 2020-03-26 DIAGNOSIS — E11621 Type 2 diabetes mellitus with foot ulcer: Secondary | ICD-10-CM | POA: Diagnosis present

## 2020-03-26 DIAGNOSIS — N184 Chronic kidney disease, stage 4 (severe): Secondary | ICD-10-CM | POA: Diagnosis present

## 2020-03-26 DIAGNOSIS — L97522 Non-pressure chronic ulcer of other part of left foot with fat layer exposed: Secondary | ICD-10-CM | POA: Insufficient documentation

## 2020-03-26 DIAGNOSIS — Z961 Presence of intraocular lens: Secondary | ICD-10-CM | POA: Diagnosis present

## 2020-03-26 DIAGNOSIS — R42 Dizziness and giddiness: Secondary | ICD-10-CM | POA: Diagnosis not present

## 2020-03-26 DIAGNOSIS — R0602 Shortness of breath: Secondary | ICD-10-CM | POA: Diagnosis not present

## 2020-03-26 DIAGNOSIS — Z803 Family history of malignant neoplasm of breast: Secondary | ICD-10-CM | POA: Diagnosis not present

## 2020-03-26 DIAGNOSIS — I5022 Chronic systolic (congestive) heart failure: Secondary | ICD-10-CM | POA: Diagnosis present

## 2020-03-26 DIAGNOSIS — Z881 Allergy status to other antibiotic agents status: Secondary | ICD-10-CM | POA: Insufficient documentation

## 2020-03-26 DIAGNOSIS — R5381 Other malaise: Secondary | ICD-10-CM | POA: Diagnosis not present

## 2020-03-26 DIAGNOSIS — Z7401 Bed confinement status: Secondary | ICD-10-CM | POA: Diagnosis not present

## 2020-03-26 DIAGNOSIS — I110 Hypertensive heart disease with heart failure: Secondary | ICD-10-CM | POA: Insufficient documentation

## 2020-03-26 DIAGNOSIS — E1122 Type 2 diabetes mellitus with diabetic chronic kidney disease: Secondary | ICD-10-CM | POA: Diagnosis present

## 2020-03-26 DIAGNOSIS — E1152 Type 2 diabetes mellitus with diabetic peripheral angiopathy with gangrene: Secondary | ICD-10-CM | POA: Diagnosis present

## 2020-03-26 DIAGNOSIS — N185 Chronic kidney disease, stage 5: Secondary | ICD-10-CM | POA: Diagnosis not present

## 2020-03-26 DIAGNOSIS — S91302A Unspecified open wound, left foot, initial encounter: Secondary | ICD-10-CM | POA: Diagnosis not present

## 2020-03-26 DIAGNOSIS — Z9842 Cataract extraction status, left eye: Secondary | ICD-10-CM | POA: Diagnosis not present

## 2020-03-26 DIAGNOSIS — S98912A Complete traumatic amputation of left foot, level unspecified, initial encounter: Secondary | ICD-10-CM | POA: Diagnosis not present

## 2020-03-26 DIAGNOSIS — F419 Anxiety disorder, unspecified: Secondary | ICD-10-CM | POA: Diagnosis present

## 2020-03-26 DIAGNOSIS — E872 Acidosis: Secondary | ICD-10-CM | POA: Diagnosis present

## 2020-03-26 DIAGNOSIS — I5042 Chronic combined systolic (congestive) and diastolic (congestive) heart failure: Secondary | ICD-10-CM | POA: Insufficient documentation

## 2020-03-26 DIAGNOSIS — R6520 Severe sepsis without septic shock: Secondary | ICD-10-CM | POA: Diagnosis not present

## 2020-03-26 DIAGNOSIS — Z87891 Personal history of nicotine dependence: Secondary | ICD-10-CM | POA: Insufficient documentation

## 2020-03-26 DIAGNOSIS — R600 Localized edema: Secondary | ICD-10-CM | POA: Diagnosis not present

## 2020-03-26 DIAGNOSIS — A4102 Sepsis due to Methicillin resistant Staphylococcus aureus: Secondary | ICD-10-CM | POA: Diagnosis present

## 2020-03-26 DIAGNOSIS — C50919 Malignant neoplasm of unspecified site of unspecified female breast: Secondary | ICD-10-CM | POA: Diagnosis not present

## 2020-03-26 DIAGNOSIS — N179 Acute kidney failure, unspecified: Secondary | ICD-10-CM | POA: Diagnosis not present

## 2020-03-26 DIAGNOSIS — J45909 Unspecified asthma, uncomplicated: Secondary | ICD-10-CM | POA: Insufficient documentation

## 2020-03-26 DIAGNOSIS — R918 Other nonspecific abnormal finding of lung field: Secondary | ICD-10-CM | POA: Diagnosis not present

## 2020-03-26 DIAGNOSIS — F32A Depression, unspecified: Secondary | ICD-10-CM | POA: Diagnosis not present

## 2020-03-26 DIAGNOSIS — Z20822 Contact with and (suspected) exposure to covid-19: Secondary | ICD-10-CM | POA: Diagnosis present

## 2020-03-26 DIAGNOSIS — F329 Major depressive disorder, single episode, unspecified: Secondary | ICD-10-CM | POA: Diagnosis present

## 2020-03-26 DIAGNOSIS — M7989 Other specified soft tissue disorders: Secondary | ICD-10-CM | POA: Diagnosis not present

## 2020-03-26 DIAGNOSIS — Z170 Estrogen receptor positive status [ER+]: Secondary | ICD-10-CM | POA: Diagnosis not present

## 2020-03-26 DIAGNOSIS — Z8249 Family history of ischemic heart disease and other diseases of the circulatory system: Secondary | ICD-10-CM | POA: Insufficient documentation

## 2020-03-26 DIAGNOSIS — Z9851 Tubal ligation status: Secondary | ICD-10-CM | POA: Diagnosis not present

## 2020-03-26 DIAGNOSIS — Z9841 Cataract extraction status, right eye: Secondary | ICD-10-CM | POA: Diagnosis not present

## 2020-03-26 DIAGNOSIS — C7970 Secondary malignant neoplasm of unspecified adrenal gland: Secondary | ICD-10-CM | POA: Diagnosis present

## 2020-03-26 DIAGNOSIS — N2581 Secondary hyperparathyroidism of renal origin: Secondary | ICD-10-CM | POA: Diagnosis present

## 2020-03-26 DIAGNOSIS — R140 Abdominal distension (gaseous): Secondary | ICD-10-CM | POA: Diagnosis not present

## 2020-03-26 DIAGNOSIS — C50212 Malignant neoplasm of upper-inner quadrant of left female breast: Secondary | ICD-10-CM | POA: Diagnosis present

## 2020-03-26 DIAGNOSIS — C7900 Secondary malignant neoplasm of unspecified kidney and renal pelvis: Secondary | ICD-10-CM | POA: Diagnosis present

## 2020-03-28 ENCOUNTER — Ambulatory Visit
Admission: RE | Admit: 2020-03-28 | Discharge: 2020-03-28 | Disposition: A | Discharge status: Home / Self Care | Payer: Medicare HMO | Source: Ambulatory Visit | Attending: Internal Medicine | Admitting: Internal Medicine

## 2020-03-28 ENCOUNTER — Other Ambulatory Visit: Payer: Self-pay

## 2020-03-28 DIAGNOSIS — C50212 Malignant neoplasm of upper-inner quadrant of left female breast: Secondary | ICD-10-CM

## 2020-03-28 DIAGNOSIS — N6320 Unspecified lump in the left breast, unspecified quadrant: Secondary | ICD-10-CM | POA: Insufficient documentation

## 2020-03-28 DIAGNOSIS — C7951 Secondary malignant neoplasm of bone: Secondary | ICD-10-CM | POA: Diagnosis not present

## 2020-03-28 DIAGNOSIS — C50919 Malignant neoplasm of unspecified site of unspecified female breast: Secondary | ICD-10-CM | POA: Diagnosis not present

## 2020-03-28 DIAGNOSIS — J90 Pleural effusion, not elsewhere classified: Secondary | ICD-10-CM | POA: Insufficient documentation

## 2020-03-28 DIAGNOSIS — C782 Secondary malignant neoplasm of pleura: Secondary | ICD-10-CM | POA: Insufficient documentation

## 2020-03-28 DIAGNOSIS — R918 Other nonspecific abnormal finding of lung field: Secondary | ICD-10-CM | POA: Diagnosis not present

## 2020-03-28 DIAGNOSIS — I700 Atherosclerosis of aorta: Secondary | ICD-10-CM | POA: Insufficient documentation

## 2020-03-28 LAB — GLUCOSE, CAPILLARY
Glucose-Capillary: 45 mg/dL — ABNORMAL LOW (ref 70–99)
Glucose-Capillary: 47 mg/dL — ABNORMAL LOW (ref 70–99)

## 2020-03-28 MED ORDER — FLUDEOXYGLUCOSE F - 18 (FDG) INJECTION
10.1000 | Freq: Once | INTRAVENOUS | Status: AC | PRN
  Administered 2020-03-28: 10.22 via INTRAVENOUS

## 2020-03-28 NOTE — Progress Notes (Addendum)
JENNEA, RAGER ([id number]) Visit Report for 03/26/2020 Allergy List Details Patient Name: JANITA, CAMBEROS A. Date of Service: 03/26/2020 1:45 PM Medical Record Number: [id number] Patient Account Number: [id number] Date of Birth/Sex: [birth year] (64 y.o. F) Treating RN: Cornell Barman Primary Care Xan Sparkman: [name] Other Clinician: Referring Jenavi Beedle: [name] Treating Azarah Dacy/Extender: Melburn Hake, HOYT Weeks in Treatment: 0 Allergies Active Allergies Bactrim Allergy Notes Electronic Signature(s) Signed: 03/27/2020 11:15:54 AM By: Darci Needle Entered By: Darci Needle on 03/26/2020 14:22:19 Trager, [name] ([id number]) -------------------------------------------------------------------------------- Arrival Information Details Patient Name: [name] Date of Service: 03/26/2020 1:45 PM Medical Record Number: [id number] Patient Account Number: [id number] Date of Birth/Sex: [birth year] (64 y.o. F) Treating RN: Cornell Barman Primary Care Hilarie Sinha: [name] Other Clinician: Referring Mechille Varghese: [name] Treating Aviyana Sonntag/Extender: Melburn Hake, HOYT Weeks in Treatment: 0 Visit Information Patient Arrived: Wheel Chair Arrival Time: 13:51 Accompanied By: friend Transfer Assistance: None Patient Identification Verified: Yes Secondary Verification Process Completed: Yes Patient Requires Transmission-Based Precautions: No Patient Has Alerts: No History Since Last Visit All ordered tests and consults were completed: No Added or deleted any medications: No Any new allergies or adverse reactions: No Had a fall or experienced change in activities of daily living that may affect risk of falls: No Signs or symptoms of abuse/neglect since last visito No Hospitalized since last visit: No Implantable device outside of the clinic excluding cellular tissue based products placed in the center since last visit: No Electronic Signature(s) Signed: 03/27/2020  11:15:54 AM By: Darci Needle Entered By: Darci Needle on 03/26/2020 13:52:02 Lajara, [name] ([id number]) -------------------------------------------------------------------------------- Clinic Level of Care Assessment Details Patient Name: [name] Date of Service: 03/26/2020 1:45 PM Medical Record Number: [id number] Patient Account Number: [id number] Date of Birth/Sex: 1956/04/14 (64 y.o. F) Treating RN: Grover Canavan Primary Care Nakeya Adinolfi: [name] Other Clinician: Referring Gagandeep Pettet: [name] Treating Cheick Suhr/Extender: Melburn Hake, HOYT Weeks in Treatment: 0 Clinic Level of Care Assessment Items TOOL 2 Quantity Score []  - Use when only an EandM is performed on the INITIAL visit 0 ASSESSMENTS - Nursing Assessment / Reassessment X - General Physical Exam (combine w/ comprehensive assessment (listed just below) when performed on new 1 20 pt. evals) X- 1 25 Comprehensive Assessment (HX, ROS, Risk Assessments, Wounds Hx, etc.) ASSESSMENTS - Wound and Skin Assessment / Reassessment []  - Simple Wound Assessment / Reassessment - one wound 0 X- 2 5 Complex Wound Assessment / Reassessment - multiple wounds []  - 0 Dermatologic / Skin Assessment (not related to wound area) ASSESSMENTS - Ostomy and/or Continence Assessment and Care []  - Incontinence Assessment and Management 0 []  - 0 Ostomy Care Assessment and Management (repouching, etc.) PROCESS - Coordination of Care []  - Simple Patient / Family Education for ongoing care 0 X- 1 20 Complex (extensive) Patient / Family Education for ongoing care []  - 0 Staff obtains Programmer, systems, Records, Test Results / Process Orders []  - 0 Staff telephones HHA, Nursing Homes / Clarify orders / etc []  - 0 Routine Transfer to another Facility (non-emergent condition) []  - 0 Routine Hospital Admission (non-emergent condition) []  - 0 New Admissions / Biomedical engineer / Ordering NPWT, Apligraf, etc. []  -  0 Emergency Hospital Admission (emergent condition) X- 1 10 Simple Discharge Coordination []  - 0 Complex (extensive) Discharge Coordination PROCESS - Special Needs []  - Pediatric / Minor Patient Management 0 []  - 0 Isolation Patient Management []  - 0 Hearing / Language / Visual special needs []  - 0 Assessment of Community assistance (transportation, D/C  planning, etc.) []  - 0 Additional assistance / Altered mentation []  - 0 Support Surface(s) Assessment (bed, cushion, seat, etc.) INTERVENTIONS - Wound Cleansing / Measurement []  - Wound Imaging (photographs - any number of wounds) 0 []  - 0 Wound Tracing (instead of photographs) []  - 0 Simple Wound Measurement - one wound X- 2 5 Complex Wound Measurement - multiple wounds Fernandez, Tristina A. ([id number]) []  - 0 Simple Wound Cleansing - one wound X- 2 5 Complex Wound Cleansing - multiple wounds INTERVENTIONS - Wound Dressings X - Small Wound Dressing one or multiple wounds 2 10 []  - 0 Medium Wound Dressing one or multiple wounds []  - 0 Large Wound Dressing one or multiple wounds []  - 0 Application of Medications - injection INTERVENTIONS - Miscellaneous []  - External ear exam 0 []  - 0 Specimen Collection (cultures, biopsies, blood, body fluids, etc.) []  - 0 Specimen(s) / Culture(s) sent or taken to Lab for analysis []  - 0 Patient Transfer (multiple staff / Civil Service fast streamer / Similar devices) []  - 0 Simple Staple / Suture removal (25 or less) []  - 0 Complex Staple / Suture removal (26 or more) []  - 0 Hypo / Hyperglycemic Management (close monitor of Blood Glucose) X- 1 15 Ankle / Brachial Index (ABI) - do not check if billed separately Has the patient been seen at the hospital within the last three years: Yes Total Score: 140 Level Of Care: New/Established - Level 4 Electronic Signature(s) Signed: 03/26/2020 4:26:28 PM By: Grover Canavan Entered By: Grover Canavan on 03/26/2020 14:59:01 Labus, Melaine AMarland Kitchen  ([id number]) -------------------------------------------------------------------------------- Lower Extremity Assessment Details Patient Name: [name] Date of Service: 03/26/2020 1:45 PM Medical Record Number: [id number] Patient Account Number: [id number] Date of Birth/Sex: [birth year] (64 y.o. F) Treating RN: Cornell Barman Primary Care Kristain Filo: [name] Other Clinician: Referring Scarleth Brame: [name] Treating Nataliah Hatlestad/Extender: Melburn Hake, HOYT Weeks in Treatment: 0 Edema Assessment Assessed: [Left: Yes] [Right: Yes] Edema: [Left: Yes] [Right: Yes] Calf Left: Right: Point of Measurement: 25 cm From Medial Instep 37 cm 38.5 cm Ankle Left: Right: Point of Measurement: 12 cm From Medial Instep 28 cm 27 cm Vascular Assessment Pulses: Dorsalis Pedis Doppler Audible: [Left:Yes] [Right:Yes] Posterior Tibial Doppler Audible: [Left:Yes] [Right:Yes] Blood Pressure: Brachial: [Right:137] Dorsalis Pedis: 180 [Left:Dorsalis Pedis: 110] Ankle: Posterior Tibial: 110 [Left:Posterior Tibial: 80 1.31] [Right:0.80] Electronic Signature(s) Signed: 03/27/2020 11:15:54 AM By: Darci Needle Signed: 03/27/2020 7:43:28 PM By: Gretta Cool, BSN, RN, CWS, Kim RN, BSN Entered By: Darci Needle on 03/26/2020 14:22:06 Blazina, Lorrane AMarland Kitchen ([id number]) -------------------------------------------------------------------------------- Multi Wound Chart Details Patient Name: [name] Date of Service: 03/26/2020 1:45 PM Medical Record Number: [id number] Patient Account Number: [id number] Date of Birth/Sex: 04/10/1956 (64 y.o. F) Treating RN: Cornell Barman Primary Care Safaa Stingley: [name] Other Clinician: Referring Lynnet Hefley: [name] Treating Felicha Frayne/Extender: Melburn Hake, HOYT Weeks in Treatment: 0 Vital Signs Height(in): 62 Pulse(bpm): 72 Weight(lbs): 194 Blood Pressure(mmHg): 137/74 Body Mass Index(BMI): 35 Temperature(F): 98.2 Respiratory Rate(breaths/min):  20 Photos: [N/A:N/A] Wound Location: Left, Lateral Foot Left, Plantar Foot N/A Wounding Event: Pressure Injury Pressure Injury N/A Primary Etiology: Diabetic Wound/Ulcer of the Lower Diabetic Wound/Ulcer of the Lower N/A Extremity Extremity Comorbid History: Cataracts, Asthma, Congestive Cataracts, Asthma, Congestive N/A Heart Failure, Hypertension, Type II Heart Failure, Hypertension, Type II Diabetes, Received Chemotherapy Diabetes, Received Chemotherapy Date Acquired: 03/05/2020 03/05/2020 N/A Weeks of Treatment: 0 0 N/A Wound Status: Open Open N/A Measurements L x W x D (cm) 3x3.5x0.1 3x4.3x0.1 N/A Area (cm) : 8.247 10.132 N/A Volume (cm) :  0.825 1.013 N/A % Reduction in Area: 0.00% N/A N/A % Reduction in Volume: 0.00% N/A N/A Classification: Unable to visualize wound bed Unable to visualize wound bed N/A Exudate Amount: None Present None Present N/A Foul Odor After Cleansing: Yes Yes N/A Odor Anticipated Due to Product No No N/A Use: Granulation Amount: None Present (0%) None Present (0%) N/A Necrotic Amount: Large (67-100%) Large (67-100%) N/A Necrotic Tissue: Eschar Eschar N/A Exposed Structures: Fat Layer (Subcutaneous Tissue): Fat Layer (Subcutaneous Tissue): N/A Yes Yes Fascia: No Fascia: No Tendon: No Tendon: No Muscle: No Muscle: No Joint: No Joint: No Bone: No Bone: No Epithelialization: N/A None N/A Treatment Notes Electronic Signature(s) Signed: 03/26/2020 4:26:28 PM By: Grover Canavan Entered By: Grover Canavan on 03/26/2020 14:43:00 Janeway, [name] ([id number]) -------------------------------------------------------------------------------- [place] Details Patient Name: [name] Date of Service: 03/26/2020 1:45 PM Medical Record Number: [id number] Patient Account Number: [id number] Date of Birth/Sex: 11/15/1955 (64 y.o. F) Treating RN: Cornell Barman Primary Care Kristilyn Coltrane: [name] Other Clinician: Referring Affie Gasner:  [name] Treating Lizbet Cirrincione/Extender: Worthy Keeler Weeks in Treatment: 0 Active Inactive Electronic Signature(s) Signed: 04/13/2020 8:23:22 AM By: Gretta Cool, BSN, RN, CWS, Kim RN, BSN Previous Signature: 03/26/2020 4:26:28 PM Version By: Grover Canavan Previous Signature: 03/27/2020 7:43:28 PM Version By: Gretta Cool, BSN, RN, CWS, Kim RN, BSN Entered By: Gretta Cool, BSN, RN, CWS, Kim on 04/13/2020 84:13:24 [name] ([id number]) -------------------------------------------------------------------------------- Pain Assessment Details Patient Name: [name] Date of Service: 03/26/2020 1:45 PM Medical Record Number: [id number] Patient Account Number: [id number] Date of Birth/Sex: Dec 05, 1955 (64 y.o. F) Treating RN: Cornell Barman Primary Care Amram Maya: [name] Other Clinician: Referring Suan Pyeatt: [name] Treating Mahkayla Preece/Extender: Melburn Hake, HOYT Weeks in Treatment: 0 Active Problems Location of Pain Severity and Description of Pain Patient Has Paino No Site Locations Pain Management and Medication Current Pain Management: Electronic Signature(s) Signed: 03/27/2020 11:15:54 AM By: Darci Needle Signed: 03/27/2020 7:43:28 PM By: Gretta Cool, BSN, RN, CWS, Kim RN, BSN Entered By: Darci Needle on 03/26/2020 13:52:17 [name] ([id number]) -------------------------------------------------------------------------------- Patient/Caregiver Education Details Patient Name: [name] Date of Service: 03/26/2020 1:45 PM Medical Record Number: [id number] Patient Account Number: [id number] Date of Birth/Gender: [birth year] (64 y.o. F) Treating RN: Grover Canavan Primary Care Physician: [name] Other Clinician: Referring Physician: [name] Treating Physician/Extender: Sharalyn Ink in Treatment: 0 Education Assessment Education Provided To: Patient Education Topics Provided Wound/Skin Impairment: Handouts: Caring for Your  Ulcer Methods: Explain/Verbal Responses: State content correctly Electronic Signature(s) Signed: 03/26/2020 4:26:28 PM By: Grover Canavan Entered By: Grover Canavan on 03/26/2020 15:00:20 [name] ([id number]) -------------------------------------------------------------------------------- Wound Assessment Details Patient Name: [name] Date of Service: 03/26/2020 1:45 PM Medical Record Number: [id number] Patient Account Number: [id number] Date of Birth/Sex: 1956/06/19 (64 y.o. F) Treating RN: Cornell Barman Primary Care Jalyssa Fleisher: [name] Other Clinician: Referring Kortny Lirette: [name] Treating Obdulio Mash/Extender: Melburn Hake, HOYT Weeks in Treatment: 0 Wound Status Wound Number: 2 Primary Diabetic Wound/Ulcer of the Lower Extremity Etiology: Wound Location: Left, Lateral Foot Wound Open Wounding Event: Pressure Injury Status: Date Acquired: 03/05/2020 Comorbid Cataracts, Asthma, Congestive Heart Failure, Weeks Of Treatment: 0 History: Hypertension, Type II Diabetes, Received Chemotherapy Clustered Wound: No Photos Wound Measurements Length: (cm) 3 Width: (cm) 3.5 Depth: (cm) 0.1 Area: (cm) 8.247 Volume: (cm) 0.825 % Reduction in Area: 0% % Reduction in Volume: 0% Wound Description Classification: Unable to visualize wound bed Exudate Amount: None Present Foul Odor After Cleansing: Yes Due to Product Use: No Slough/Fibrino  No Wound Bed Granulation Amount: None Present (0%) Exposed Structure Necrotic Amount: Large (67-100%) Fascia Exposed: No Necrotic Quality: Eschar Fat Layer (Subcutaneous Tissue) Exposed: Yes Tendon Exposed: No Muscle Exposed: No Joint Exposed: No Bone Exposed: No Electronic Signature(s) Signed: 03/27/2020 11:15:54 AM By: Darci Needle Signed: 03/27/2020 7:43:28 PM By: Gretta Cool, BSN, RN, CWS, Kim RN, BSN Entered By: Darci Needle on 03/26/2020 14:30:58 Freeney, Fadumo AMarland Kitchen  ([id number]) -------------------------------------------------------------------------------- Wound Assessment Details Patient Name: [name] Date of Service: 03/26/2020 1:45 PM Medical Record Number: [id number] Patient Account Number: [id number] Date of Birth/Sex: Mar 20, 1956 (64 y.o. F) Treating RN: Cornell Barman Primary Care Lexianna Weinrich: [name] Other Clinician: Referring Kenda Kloehn: [name] Treating Grabiel Schmutz/Extender: Melburn Hake, HOYT Weeks in Treatment: 0 Wound Status Wound Number: 3 Primary Diabetic Wound/Ulcer of the Lower Extremity Etiology: Wound Location: Left, Plantar Foot Wound Open Wounding Event: Pressure Injury Status: Date Acquired: 03/05/2020 Comorbid Cataracts, Asthma, Congestive Heart Failure, Weeks Of Treatment: 0 History: Hypertension, Type II Diabetes, Received Chemotherapy Clustered Wound: No Photos Wound Measurements Length: (cm) 3 Width: (cm) 4.3 Depth: (cm) 0.1 Area: (cm) 10.132 Volume: (cm) 1.013 % Reduction in Area: % Reduction in Volume: Epithelialization: None Wound Description Classification: Unable to visualize wound bed Exudate Amount: None Present Foul Odor After Cleansing: Yes Due to Product Use: No Wound Bed Granulation Amount: None Present (0%) Exposed Structure Necrotic Amount: Large (67-100%) Fascia Exposed: No Necrotic Quality: Eschar Fat Layer (Subcutaneous Tissue) Exposed: Yes Tendon Exposed: No Muscle Exposed: No Joint Exposed: No Bone Exposed: No Electronic Signature(s) Signed: 03/27/2020 11:15:54 AM By: Darci Needle Signed: 03/27/2020 7:43:28 PM By: Gretta Cool, BSN, RN, CWS, Kim RN, BSN Entered By: Darci Needle on 03/26/2020 14:33:10 Hodgman, Glendell AMarland Kitchen ([id number]) -------------------------------------------------------------------------------- Vit[place] Details Patient Name: [name] Date of Service: 03/26/2020 1:45 PM Medical Record Number: [id number] Patient Account Number: [id number] Date of  Birth/Sex: Jul 03, 1956 (64 y.o. F) Treating RN: Cornell Barman Primary Care Birgit Nowling: [name] Other Clinician: Referring Demontez Novack: [name] Treating Lindwood Mogel/Extender: Melburn Hake, HOYT Weeks in Treatment: 0 Vital Signs Time Taken: 01:35 Temperature (F): 98.2 Height (in): 62 Pulse (bpm): 72 Source: Stated Respiratory Rate (breaths/min): 20 Weight (lbs): 194 Blood Pressure (mmHg): 137/74 Source: Stated Reference Range: 80 - 120 mg / dl Body Mass Index (BMI): 35.5 Electronic Signature(s) Signed: 03/27/2020 11:15:54 AM By: Darci Needle Entered By: Darci Needle on 03/26/2020 13:53:30

## 2020-03-28 NOTE — Progress Notes (Signed)
TANGANIKA, BARRADAS ([id number]) Visit Report for 03/26/2020 Abuse/Suicide Risk Screen Details Patient Name: [name], [name] Date of Service: 03/26/2020 1:45 PM Medical Record Number: [id number] Patient Account Number: [id number] Date of Birth/Sex: Apr 04, 1956 (64 y.o. F) Treating RN: Cornell Barman Primary Care Rafael Quesada: [name] Other Clinician: Referring Anarely Nicholls: [name] Treating Emony Dormer/Extender: Melburn Hake, HOYT Weeks in Treatment: 0 Abuse/Suicide Risk Screen Items Answer ABUSE RISK SCREEN: Has anyone close to you tried to hurt or harm you recentlyo No Do you feel uncomfortable with anyone in your familyo No Has anyone forced you do things that you didnot want to doo No Electronic Signature(s) Signed: 03/27/2020 11:15:54 AM By: Darci Needle Signed: 03/27/2020 7:43:28 PM By: Gretta Cool, BSN, RN, CWS, Kim RN, BSN Entered By: Darci Needle on 03/26/2020 14:25:39 [place], Bekka AMarland Kitchen ([id number]) -------------------------------------------------------------------------------- Activities of Daily Living Details Patient Name: Gloria Both A. Date of Service: 03/26/2020 1:45 PM Medical Record Number: [id number] Patient Account Number: [id number] Date of Birth/Sex: 12-05-1955 (65 y.o. F) Treating RN: Cornell Barman Primary Care Nhung Danko: [name] Other Clinician: Referring Yonas Bunda: [name] Treating Cleva Camero/Extender: Melburn Hake, HOYT Weeks in Treatment: 0 Activities of Daily Living Items Answer Activities of Daily Living (Please select one for each item) Drive Automobile Not Able Take Medications Completely Able Use Telephone Completely Able Care for Appearance Completely Able Use Toilet Completely Able Bath / Shower Completely Able Dress Self Completely Able Feed Self Completely Able Walk Completely Able Get In / Out Bed Completely Able Housework Completely Able Prepare Meals Completely Able Handle Money Completely Able Shop for Self Completely  Able Electronic Signature(s) Signed: 03/27/2020 11:15:54 AM By: Darci Needle Signed: 03/27/2020 7:43:28 PM By: Gretta Cool, BSN, RN, CWS, Kim RN, BSN Entered By: Darci Needle on 03/26/2020 14:26:34 Oh, [name] ([id number]) -------------------------------------------------------------------------------- Education Screening Details Patient Name: Gloria Both A. Date of Service: 03/26/2020 1:45 PM Medical Record Number: [id number] Patient Account Number: [id number] Date of Birth/Sex: Aug 27, 1955 (64 y.o. F) Treating RN: Cornell Barman Primary Care Coletta Lockner: [name] Other Clinician: Referring Dinesha Twiggs: [name] Treating Madelena Maturin/Extender: Melburn Hake, HOYT Weeks in Treatment: 0 Learning Preferences/Education Level/Primary Language Highest Education Level: College or Above Preferred Language: English Cognitive Barrier Language Barrier: No Translator Needed: No Memory Deficit: No Emotional Barrier: No Cultural/Religious Beliefs Affecting Medical Care: No Physical Barrier Impaired Vision: No Impaired Hearing: No Decreased Hand dexterity: No Knowledge/Comprehension Knowledge Level: High Comprehension Level: High Ability to understand written instructions: High Ability to understand verbal instructions: High Motivation Anxiety Level: Calm Cooperation: Cooperative Education Importance: Acknowledges Need Interest in Health Problems: Asks Questions Perception: Coherent Willingness to Engage in Self-Management High Activities: Readiness to Engage in Self-Management High Activities: Electronic Signature(s) Signed: 03/27/2020 11:15:54 AM By: Darci Needle Signed: 03/27/2020 7:43:28 PM By: Gretta Cool, BSN, RN, CWS, Kim RN, BSN Entered By: Darci Needle on 03/26/2020 14:27:17 Depaoli, [name] ([id number]) -------------------------------------------------------------------------------- Fall Risk Assessment Details Patient Name: Gloria Both A. Date of Service:  03/26/2020 1:45 PM Medical Record Number: [id number] Patient Account Number: [id number] Date of Birth/Sex: 1956-07-16 (64 y.o. F) Treating RN: Cornell Barman Primary Care Kalliope Riesen: [name] Other Clinician: Referring Avraham Benish: [name] Treating Ameera Tigue/Extender: Melburn Hake, HOYT Weeks in Treatment: 0 Fall Risk Assessment Items Have you had 2 or more falls in the last 12 monthso 0 Yes Have you had any fall that resulted in injury in the last 12 monthso 0 No FALLS RISK SCREEN History of falling - immediate or within 3 months 25 Yes Secondary diagnosis (Do you have 2 or more medical diagnoseso) 15  Yes Ambulatory aid None/bed rest/wheelchair/nurse 0 Yes Crutches/cane/walker 0 No Furniture 0 No Intravenous therapy Access/Saline/Heparin Lock 0 No Gait/Transferring Normal/ bed rest/ wheelchair 0 No Weak (short steps with or without shuffle, stooped but able to lift head while walking, may 0 No seek support from furniture) Impaired (short steps with shuffle, may have difficulty arising from chair, head down, impaired 0 No balance) Mental Status Oriented to own ability 0 Yes Electronic Signature(s) Signed: 03/27/2020 11:15:54 AM By: Darci Needle Signed: 03/27/2020 7:43:28 PM By: Gretta Cool, BSN, RN, CWS, Kim RN, BSN Entered By: Darci Needle on 03/26/2020 14:28:09 [place], [name] ([id number]) -------------------------------------------------------------------------------- Foot Assessment Details Patient Name: Gloria Both A. Date of Service: 03/26/2020 1:45 PM Medical Record Number: [id number] Patient Account Number: [id number] Date of Birth/Sex: 1956-04-08 (64 y.o. F) Treating RN: Cornell Barman Primary Care Ricard Faulkner: [name] Other Clinician: Referring Jayshon Dommer: [name] Treating Eavan Gonterman/Extender: Melburn Hake, HOYT Weeks in Treatment: 0 Foot Assessment Items Site Locations + = Sensation present, - = Sensation absent, C = Callus, U = Ulcer R = Redness, W =  Warmth, M = Maceration, PU = Pre-ulcerative lesion F = Fissure, S = Swelling, D = Dryness Assessment Right: Left: Other Deformity: No No Prior Foot Ulcer: No No Prior Amputation: No No Charcot Joint: No No Ambulatory Status: Gait: Electronic Signature(s) Signed: 03/27/2020 11:15:54 AM By: Darci Needle Signed: 03/27/2020 7:43:28 PM By: Gretta Cool, BSN, RN, CWS, Kim RN, BSN Entered By: Darci Needle on 03/26/2020 14:29:05 [place], Altamese AMarland Kitchen ([id number]) -------------------------------------------------------------------------------- Nutrition Risk Screening Details Patient Name: Gloria Both A. Date of Service: 03/26/2020 1:45 PM Medical Record Number: [id number] Patient Account Number: [id number] Date of Birth/Sex: Jul 10, 1956 (64 y.o. F) Treating RN: Cornell Barman Primary Care Marcellius Montagna: [name] Other Clinician: Referring Nyema Hachey: [name] Treating Yvett Rossel/Extender: Melburn Hake, HOYT Weeks in Treatment: 0 Height (in): 62 Weight (lbs): 194 Body Mass Index (BMI): 35.5 Nutrition Risk Screening Items Score Screening NUTRITION RISK SCREEN: I have an illness or condition that made me change the kind and/or amount of food I eat 0 No I eat fewer than two meals per day 0 No I eat few fruits and vegetables, or milk products 0 No I have three or more drinks of beer, liquor or wine almost every day 0 No I have tooth or mouth problems that make it hard for me to eat 0 No I don't always have enough money to buy the food I need 0 No I eat alone most of the time 0 No I take three or more different prescribed or over-the-counter drugs a day 1 Yes Without wanting to, I have lost or gained 10 pounds in the last six months 0 No I am not always physically able to shop, cook and/or feed myself 0 No Nutrition Protocols Good Risk Protocol 0 No interventions needed Moderate Risk Protocol High Risk Proctocol Risk Level: Good Risk Score: 1 Electronic Signature(s) Signed: 03/27/2020  11:15:54 AM By: Darci Needle Signed: 03/27/2020 7:43:28 PM By: Gretta Cool, BSN, RN, CWS, Kim RN, BSN Entered By: Darci Needle on 03/26/2020 14:28:33

## 2020-03-28 NOTE — Progress Notes (Signed)
ALYZA, ARTIAGA ([id number]) Visit Report for 03/26/2020 Chief Complaint Document Details Patient Name: [name], [name] Date of Service: 03/26/2020 1:45 PM Medical Record Number: [id number] Patient Account Number: [id number] Date of Birth/Sex: 1955/12/06 (64 y.o. F) Treating RN: Cornell Barman Primary Care Provider: [name] Other Clinician: Referring Provider: [name] Treating Provider/Extender: Melburn Hake, Jaceion Aday Weeks in Treatment: 0 Information Obtained from: Patient Chief Complaint Left foot ulcer Electronic Signature(s) Signed: 03/26/2020 2:40:02 PM By: Worthy Keeler PA-C Entered By: Worthy Keeler on 03/26/2020 14:40:02 Maeda, [name] ([id number]) -------------------------------------------------------------------------------- HPI Details Patient Name: Gloria Both A. Date of Service: 03/26/2020 1:45 PM Medical Record Number: [id number] Patient Account Number: [id number] Date of Birth/Sex: 12-06-1955 (64 y.o. F) Treating RN: Cornell Barman Primary Care Provider: [name] Other Clinician: Referring Provider: [name] Treating Provider/Extender: Melburn Hake, Jai Bear Weeks in Treatment: 0 History of Present Illness HPI Description: 12/27/2019 upon evaluation today patient appears to be doing somewhat poorly upon initial inspection here in the clinic. She has unfortunately been having issues with for the past week a blister over her right heel that started on the plantar aspect and has spread to the medial aspect. She does have a history of diabetes mellitus type 2 she also has hypertension along with congestive heart failure. With that being said she was supposed to be having knee surgery on her right knee but this was postponed by the surgeon and she was referred to [place] due to the blister noted. Fortunately there is no signs of active infection at this time. With that being said I am concerned about the fact that this could develop into infection. Obviously we  want to prevent such from happening. She does note that she been placed on a antibiotic for urinary tract infection by her primary care provider she has that to pick up. I asked her to call and let us know what that antibiotic was so that we can put that in the chart. For that reason I am not can give her anything empirically at this point. The patient cannot remember any injury that she had to this region in fact she really does not know how this began at all other than the fact that it "just showed up". 01/05/2020 upon evaluation today patient appears to be doing about the same in regard to her heel ulcer. She has been tolerating the dressing changes without complication. Fortunately there is no signs of active infection at this time. No fevers, chills, nausea, vomiting, or diarrhea. With that being said I do believe the skin is getting need to be removed from the heel where this is somewhat deflated there is still a lot of blood collecting underneath and I think this is not can I do her any good to be perfectly honest. Plus we do not really know exactly what everything looks like underneath as far as the actual wound is concerned. Obviously we need to figure that out. 01/12/2020 upon evaluation today patient's wound actually appears to be doing excellent in fact this is very close to complete resolution. Subsequently I also did send a culture after removing the blood blister last week and the patient did not have any bacteria noted just normal skin flora and no growth otherwise after 2 days. Fortunately there is no signs of active infection at this time systemically or locally. Overall I feel that she is doing excellent. 01/19/2020 upon evaluation today patient appears to be doing excellent at this time in regard to her heel ulcer  for the most part. The one issue that we do see is that she is having a new blistered area that amount to clear away some of the skin from today. This seems to be due to her  foot slipping in the offloading shoe that she currently is utilizing. Fortunately there is no signs of active infection at this time. No fevers, chills, nausea, vomiting, or diarrhea. 01/26/2020 upon evaluation today patient's wound actually showed signs of excellent improvement. She has made great progress even since last week's visit. I do believe that she continues to tolerate the dressing changes without any complication and I am very pleased with that as well. In general I think that we will likely continue with the current measures as long as we are continuing to see the results that we are at this point. 02/02/2020 upon evaluation today patient appears to be doing excellent in regard to her wounds currently. She has been tolerating the dressing changes without complication in fact this appears to be completely healed today based on what I am seeing. Readmission: 03/26/2020 on evaluation today patient presents for readmission concerning an issue that she is actually having with her opposite foot compared to the one that I treated last time she was with this back in July when she healed. Subsequently she tells me that she really does not know how this came up. She does not have any particular injury that she is aware of at this point. With that being said she tells me that the area came up somewhat unexpectedly and has been dark and similar to what she had before. She has had some drainage from it as well. She has been on Keflex initially for urinary tract infection then her doctor actually gave her a second round due to the heel itself in order to try to help with this. Nonetheless she still has erythema and warmth of the foot I am concerned that the Keflex is not the appropriate medication. Previously we used Augmentin but that is very similar to the Keflex to be honest. I am not really sure if that would be the best option for her at this time. Nonetheless she probably needs something oral while  we await the results of the culture and then if we need to make any adjustments or changes we can do so at that point. I do need to keep her kidney function in consideration with this. Electronic Signature(s) Signed: 03/26/2020 3:16:15 PM By: Worthy Keeler PA-C Entered By: Worthy Keeler on 03/26/2020 15:16:15 Distel, [name] ([id number]) -------------------------------------------------------------------------------- Physical Exam Details Patient Name: Gloria Both A. Date of Service: 03/26/2020 1:45 PM Medical Record Number: [id number] Patient Account Number: [id number] Date of Birth/Sex: Dec 28, 1955 (64 y.o. F) Treating RN: Cornell Barman Primary Care Provider: [name] Other Clinician: Referring Provider: [name] Treating Provider/Extender: Melburn Hake, Gustav Knueppel Weeks in Treatment: 0 Constitutional sitting or standing blood pressure is within target range for patient.. pulse regular and within target range for patient.Marland Kitchen respirations regular, non- labored and within target range for patient.Marland Kitchen temperature within target range for patient.. Well-nourished and well-hydrated in no acute distress. Eyes conjunctiva clear no eyelid edema noted. pupils equal round and reactive to light and accommodation. Ears, Nose, Mouth, and Throat no gross abnormality of ear auricles or external auditory canals. normal hearing noted during conversation. mucus membranes moist. Respiratory normal breathing without difficulty. Cardiovascular 1+ pitting edema of the bilateral lower extremities. Musculoskeletal Patient unable to walk without assistance. no significant deformity or arthritic  changes, no loss or range of motion, no clubbing. Psychiatric this patient is able to make decisions and demonstrates good insight into disease process. Alert and Oriented x 3. pleasant and cooperative. Notes Section patient's wound actually showed signs of poor granulation at this time in fact there is a lot of  eschar noted at this point which it does have me concerned. Fortunately there is no signs of active infection at this time which is great news systemically though locally I feel like she does have evidence of infection based on what I am seeing. No sharp debridement was performed right now being that she has had a couple of these happen despite the fact were getting okay readings for her ABIs I really would like to have a more thorough arterial study with ABIs and TBI's to ensure she actually has good blood flow that there is nothing seen that is good to require any type of intervention. Electronic Signature(s) Signed: 03/26/2020 3:17:32 PM By: Worthy Keeler PA-C Entered By: Worthy Keeler on 03/26/2020 15:17:32 Wierman, [name] ([id number]) -------------------------------------------------------------------------------- Physician Orders Details Patient Name: Gloria Both A. Date of Service: 03/26/2020 1:45 PM Medical Record Number: [id number] Patient Account Number: [id number] Date of Birth/Sex: 1955-08-20 (64 y.o. F) Treating RN: Grover Canavan Primary Care Provider: [name] Other Clinician: Referring Provider: [name] Treating Provider/Extender: Melburn Hake, Eustacio Ellen Weeks in Treatment: 0 Verbal / Phone Orders: No Diagnosis Coding ICD-10 Coding Code Description E11.621 Type 2 diabetes mellitus with foot ulcer L97.522 Non-pressure chronic ulcer of other part of left foot with fat layer exposed I10 Essential (primary) hypertension I50.42 Chronic combined systolic (congestive) and diastolic (congestive) heart failure Wound Cleansing o May shower with protection. Primary Wound Dressing Wound #2 Left,Lateral Foot o Other: - povidone iodine Wound #3 Left,Plantar Foot o Silver Alginate Secondary Dressing o Gauze and Kerlix/Conform Dressing Change Frequency o Change dressing every other day. Follow-up Appointments o Return Appointment in 1  week. Laboratory o Bacteria identified in Wound by Culture (MICRO) oooo LOINC Code: 3220-2 R[facility] Convenience Name: Wound culture routine Services and Therapies o Arterial Studies- Bilateral - with ABI and TBI Patient Medications Allergies: Bactrim Notifications Medication Indication Start End doxycycline hyclate 03/26/2020 DOSE 1 - oral 100 mg capsule - 1 capsule oral taken 2 times per day for 14 days Notes doxycycline Electronic Signature(s) Signed: 03/26/2020 3:04:06 PM By: Haydee Monica, Ubah AMarland Kitchen ([id number]) Entered By: Worthy Keeler on 03/26/2020 15:04:05 Huynh, Curry AMarland Kitchen ([id number]) -------------------------------------------------------------------------------- Problem List Details Patient Name: Gloria Both A. Date of Service: 03/26/2020 1:45 PM Medical Record Number: [id number] Patient Account Number: [id number] Date of Birth/Sex: 06-17-1956 (64 y.o. F) Treating RN: Cornell Barman Primary Care Provider: [name] Other Clinician: Referring Provider: [name] Treating Provider/Extender: Melburn Hake, Laria Grimmett Weeks in Treatment: 0 Active Problems ICD-10 Encounter Code Description Active Date MDM Diagnosis E11.621 Type 2 diabetes mellitus with foot ulcer 03/26/2020 No Yes L97.522 Non-pressure chronic ulcer of other part of left foot with fat layer 03/26/2020 No Yes exposed I10 Essential (primary) hypertension 03/26/2020 No Yes I50.42 Chronic combined systolic (congestive) and diastolic (congestive) heart 03/26/2020 No Yes failure Inactive Problems Resolved Problems Electronic Signature(s) Signed: 03/26/2020 2:39:36 PM By: Worthy Keeler PA-C Entered By: Worthy Keeler on 03/26/2020 14:39:36 Cecilio, Lissie AMarland Kitchen ([id number]) -------------------------------------------------------------------------------- Progress Note Details Patient Name: Gloria Both A. Date of Service: 03/26/2020 1:45 PM Medical Record Number: [id number] Patient Account Number:  [id number] Date of Birth/Sex: 1956/06/02 (64 y.o. F) Treating RN: Cornell Barman  Primary Care Provider: [name] Other Clinician: Referring Provider: [name] Treating Provider/Extender: Melburn Hake, Yajahira Tison Weeks in Treatment: 0 Subjective Chief Complaint Information obtained from Patient Left foot ulcer History of Present Illness (HPI) 12/27/2019 upon evaluation today patient appears to be doing somewhat poorly upon initial inspection here in the clinic. She has unfortunately been having issues with for the past week a blister over her right heel that started on the plantar aspect and has spread to the medial aspect. She does have a history of diabetes mellitus type 2 she also has hypertension along with congestive heart failure. With that being said she was supposed to be having knee surgery on her right knee but this was postponed by the surgeon and she was referred to [place] due to the blister noted. Fortunately there is no signs of active infection at this time. With that being said I am concerned about the fact that this could develop into infection. Obviously we want to prevent such from happening. She does note that she been placed on a antibiotic for urinary tract infection by her primary care provider she has that to pick up. I asked her to call and let us know what that antibiotic was so that we can put that in the chart. For that reason I am not can give her anything empirically at this point. The patient cannot remember any injury that she had to this region in fact she really does not know how this began at all other than the fact that it "just showed up". 01/05/2020 upon evaluation today patient appears to be doing about the same in regard to her heel ulcer. She has been tolerating the dressing changes without complication. Fortunately there is no signs of active infection at this time. No fevers, chills, nausea, vomiting, or diarrhea. With that being said I do believe the skin  is getting need to be removed from the heel where this is somewhat deflated there is still a lot of blood collecting underneath and I think this is not can I do her any good to be perfectly honest. Plus we do not really know exactly what everything looks like underneath as far as the actual wound is concerned. Obviously we need to figure that out. 01/12/2020 upon evaluation today patient's wound actually appears to be doing excellent in fact this is very close to complete resolution. Subsequently I also did send a culture after removing the blood blister last week and the patient did not have any bacteria noted just normal skin flora and no growth otherwise after 2 days. Fortunately there is no signs of active infection at this time systemically or locally. Overall I feel that she is doing excellent. 01/19/2020 upon evaluation today patient appears to be doing excellent at this time in regard to her heel ulcer for the most part. The one issue that we do see is that she is having a new blistered area that amount to clear away some of the skin from today. This seems to be due to her foot slipping in the offloading shoe that she currently is utilizing. Fortunately there is no signs of active infection at this time. No fevers, chills, nausea, vomiting, or diarrhea. 01/26/2020 upon evaluation today patient's wound actually showed signs of excellent improvement. She has made great progress even since last week's visit. I do believe that she continues to tolerate the dressing changes without any complication and I am very pleased with that as well. In general I think that we  will likely continue with the current measures as long as we are continuing to see the results that we are at this point. 02/02/2020 upon evaluation today patient appears to be doing excellent in regard to her wounds currently. She has been tolerating the dressing changes without complication in fact this appears to be completely healed today  based on what I am seeing. Readmission: 03/26/2020 on evaluation today patient presents for readmission concerning an issue that she is actually having with her opposite foot compared to the one that I treated last time she was with this back in July when she healed. Subsequently she tells me that she really does not know how this came up. She does not have any particular injury that she is aware of at this point. With that being said she tells me that the area came up somewhat unexpectedly and has been dark and similar to what she had before. She has had some drainage from it as well. She has been on Keflex initially for urinary tract infection then her doctor actually gave her a second round due to the heel itself in order to try to help with this. Nonetheless she still has erythema and warmth of the foot I am concerned that the Keflex is not the appropriate medication. Previously we used Augmentin but that is very similar to the Keflex to be honest. I am not really sure if that would be the best option for her at this time. Nonetheless she probably needs something oral while we await the results of the culture and then if we need to make any adjustments or changes we can do so at that point. I do need to keep her kidney function in consideration with this. Patient History Information obtained from Patient. Allergies Bactrim Family History Cancer - Father,Mother, Diabetes - Father,Mother,Siblings, Hypertension - Mother,Father, Lung Disease - Father, Thyroid Problems - Siblings, No family history of Heart Disease, Hereditary Spherocytosis, Kidney Disease, Seizures, Stroke, Tuberculosis. Social History Former smoker - 15 years, Marital Status - Single, Alcohol Use - Never, Drug Use - No History, Caffeine Use - Daily. JOELYS, STAUBS ([id number]) Medical History Eyes Patient has history of Cataracts - removed Denies history of Glaucoma, Optic Neuritis Ear/Nose/Mouth/Throat Denies history of  Chronic sinus problems/congestion, Middle ear problems Hematologic/Lymphatic Denies history of Anemia, Hemophilia, Human Immunodeficiency Virus, Lymphedema, Sickle Cell Disease Respiratory Patient has history of Asthma Denies history of Aspiration, Chronic Obstructive Pulmonary Disease (COPD), Pneumothorax, Sleep Apnea, Tuberculosis Cardiovascular Patient has history of Congestive Heart Failure, Hypertension Denies history of Angina, Arrhythmia, Coronary Artery Disease, Deep Vein Thrombosis, Hypotension, Myocardial Infarction, Peripheral Arterial Disease, Peripheral Venous Disease, Phlebitis, Vasculitis Gastrointestinal Denies history of Cirrhosis , Colitis, Crohn s, Hepatitis A, Hepatitis B, Hepatitis C Endocrine Patient has history of Type II Diabetes Denies history of Type I Diabetes Genitourinary Denies history of End Stage Renal Disease Immunological Denies history of Lupus Erythematosus, Raynaud s, Scleroderma Integumentary (Skin) Denies history of History of Burn, History of pressure wounds Musculoskeletal Denies history of Gout, Rheumatoid Arthritis, Osteoarthritis, Osteomyelitis Neurologic Denies history of Dementia, Neuropathy, Quadriplegia, Paraplegia, Seizure Disorder Oncologic Patient has history of Received Chemotherapy Denies history of Received Radiation Psychiatric Denies history of Anorexia/bulimia, Confinement Anxiety Hospitalization/Surgery History - [facility] weakness. Review of Systems (ROS) Constitutional Symptoms (General Health) Complains or has symptoms of Fatigue. Denies complaints or symptoms of Fever, Chills, Marked Weight Change. Eyes Denies complaints or symptoms of Dry Eyes, Vision Changes, Glasses / Contacts. Ear/Nose/Mouth/Throat Denies complaints or symptoms of Difficult clearing  ears, Sinusitis. Hematologic/Lymphatic Denies complaints or symptoms of Bleeding / Clotting Disorders, Human Immunodeficiency Virus. Respiratory Denies complaints or  symptoms of Chronic or frequent coughs, Shortness of Breath. Cardiovascular Complains or has symptoms of LE edema. Denies complaints or symptoms of Chest pain. Gastrointestinal Denies complaints or symptoms of Frequent diarrhea, Nausea, Vomiting. Endocrine Denies complaints or symptoms of Hepatitis, Thyroid disease, Polydypsia (Excessive Thirst). Genitourinary Denies complaints or symptoms of Kidney failure/ Dialysis, Incontinence/dribbling. Immunological Denies complaints or symptoms of Hives, Itching. Integumentary (Skin) Complains or has symptoms of Wounds - dibetic foot ulcers, Breakdown, Swelling. Denies complaints or symptoms of Bleeding or bruising tendency. Musculoskeletal Denies complaints or symptoms of Muscle Pain, Muscle Weakness. Neurologic Denies complaints or symptoms of Numbness/parasthesias, Focal/Weakness. Psychiatric Denies complaints or symptoms of Anxiety, Claustrophobia. Objective Curling, Amely A. ([id number]) Constitutional sitting or standing blood pressure is within target range for patient.. pulse regular and within target range for patient.Marland Kitchen respirations regular, non- labored and within target range for patient.Marland Kitchen temperature within target range for patient.. Well-nourished and well-hydrated in no acute distress. Vitals Time Taken: 1:35 AM, Height: 62 in, Source: Stated, Weight: 194 lbs, Source: Stated, BMI: 35.5, Temperature: 98.2 F, Pulse: 72 bpm, Respiratory Rate: 20 breaths/min, Blood Pressure: 137/74 mmHg. Eyes conjunctiva clear no eyelid edema noted. pupils equal round and reactive to light and accommodation. Ears, Nose, Mouth, and Throat no gross abnormality of ear auricles or external auditory canals. normal hearing noted during conversation. mucus membranes moist. Respiratory normal breathing without difficulty. Cardiovascular 1+ pitting edema of the bilateral lower extremities. Musculoskeletal Patient unable to walk without assistance. no  significant deformity or arthritic changes, no loss or range of motion, no clubbing. Psychiatric this patient is able to make decisions and demonstrates good insight into disease process. Alert and Oriented x 3. pleasant and cooperative. General Notes: Section patient's wound actually showed signs of poor granulation at this time in fact there is a lot of eschar noted at this point which it does have me concerned. Fortunately there is no signs of active infection at this time which is great news systemically though locally I feel like she does have evidence of infection based on what I am seeing. No sharp debridement was performed right now being that she has had a couple of these happen despite the fact were getting okay readings for her ABIs I really would like to have a more thorough arterial study with ABIs and TBI's to ensure she actually has good blood flow that there is nothing seen that is good to require any type of intervention. Integumentary (Hair, Skin) Wound #2 status is Open. Original cause of wound was Pressure Injury. The wound is located on the Left,Lateral Foot. The wound measures 3cm length x 3.5cm width x 0.1cm depth; 8.247cm^2 area and 0.825cm^3 volume. There is Fat Layer (Subcutaneous Tissue) exposed. There is a none present amount of drainage noted. Foul odor after cleansing was noted. There is no granulation within the wound bed. There is a large (67- 100%) amount of necrotic tissue within the wound bed including Eschar. Wound #3 status is Open. Original cause of wound was Pressure Injury. The wound is located on the [place]. The wound measures 3cm length x 4.3cm width x 0.1cm depth; 10.132cm^2 area and 1.013cm^3 volume. There is Fat Layer (Subcutaneous Tissue) exposed. There is a none present amount of drainage noted. Foul odor after cleansing was noted. There is no granulation within the wound bed. There is a large (67- 100%) amount of necrotic tissue  within the  wound bed including Eschar. Assessment Active Problems ICD-10 Type 2 diabetes mellitus with foot ulcer Non-pressure chronic ulcer of other part of left foot with fat layer exposed Essential (primary) hypertension Chronic combined systolic (congestive) and diastolic (congestive) heart failure Plan Wound Cleansing: May shower with protection. Primary Wound Dressing: Wound #2 Left,Lateral Foot: Other: - povidone iodine Wound #3 Left,Plantar Foot: Silver Alginate Secondary Dressing: Gauze and Kerlix/Conform Dressing Change Frequency: Change dressing every other day. Yoon, Shanetha A. ([id number]) Follow-up Appointments: Return Appointment in 1 week. Laboratory ordered were: Wound culture routine Services and Therapies ordered were: Arterial Studies- Bilateral - with ABI and TBI The following medication(s) was prescribed: doxycycline hyclate oral 100 mg capsule 1 1 capsule oral taken 2 times per day for 14 days starting 03/26/2020 General Notes: doxycycline 1. I would recommend currently that we actually utilized initially for the wounds and then subsequently put an alginate over the distal aspect wound as this seems to be draining a little bit more I think it needs a little help in that regard. The more proximal is mainly just dry and I think the Betadine alone is fine for this. 2. We will just use dry gauze to secure this through a roll gauze technique and then subsequently use an ABD pad as well for some padding. 3. I am also can recommend the patient should avoid extensive standing or lengthy walking at this point which could make things worse as well. 4. With regard to infection I am going to go ahead and place her on doxycycline this seems to be an appropriate medication even with her kidney function. I am also obtaining a wound culture today we will see what the results of this culture show. 5. I am also can recommend that we go ahead and initiate treatment with a referral to  vascular for arterial studies with ABI and TBI due to the fact that she has had recurrent issues like this with her feet that seem to happen unprovoked. I think we want to ensure to be honest that she does not have any issues with arterial flow. We will see patient back for reevaluation in 1 week here in the clinic. If anything worsens or changes patient will contact our office for additional recommendations. Electronic Signature(s) Signed: 03/26/2020 3:18:52 PM By: Worthy Keeler PA-C Entered By: Worthy Keeler on 03/26/2020 15:18:52 Trine, [name] ([id number]) -------------------------------------------------------------------------------- ROS/PFSH Details Patient Name: Gloria Both A. Date of Service: 03/26/2020 1:45 PM Medical Record Number: [id number] Patient Account Number: [id number] Date of Birth/Sex: 1955-10-29 (64 y.o. F) Treating RN: Cornell Barman Primary Care Provider: [name] Other Clinician: Referring Provider: [name] Treating Provider/Extender: Melburn Hake, Jensen Cheramie Weeks in Treatment: 0 Information Obtained From Patient Constitutional Symptoms (General Health) Complaints and Symptoms: Positive for: Fatigue Negative for: Fever; Chills; Marked Weight Change Eyes Complaints and Symptoms: Negative for: Dry Eyes; Vision Changes; Glasses / Contacts Medical History: Positive for: Cataracts - removed Negative for: Glaucoma; Optic Neuritis Ear/Nose/Mouth/Throat Complaints and Symptoms: Negative for: Difficult clearing ears; Sinusitis Medical History: Negative for: Chronic sinus problems/congestion; Middle ear problems Hematologic/Lymphatic Complaints and Symptoms: Negative for: Bleeding / Clotting Disorders; Human Immunodeficiency Virus Medical History: Negative for: Anemia; Hemophilia; Human Immunodeficiency Virus; Lymphedema; Sickle Cell Disease Respiratory Complaints and Symptoms: Negative for: Chronic or frequent coughs; Shortness of Breath Medical  History: Positive for: Asthma Negative for: Aspiration; Chronic Obstructive Pulmonary Disease (COPD); Pneumothorax; Sleep Apnea; Tuberculosis Cardiovascular Complaints and Symptoms: Positive for: LE edema Negative for: Chest pain  Medical History: Positive for: Congestive Heart Failure; Hypertension Negative for: Angina; Arrhythmia; Coronary Artery Disease; Deep Vein Thrombosis; Hypotension; Myocardial Infarction; Peripheral Arterial Disease; Peripheral Venous Disease; Phlebitis; Vasculitis Gastrointestinal Complaints and Symptoms: Negative for: Frequent diarrhea; Nausea; Vomiting Medical History: Negative for: Cirrhosis ; Colitis; Crohnos; Hepatitis A; Hepatitis B; Hepatitis C Elias, Dameisha A. ([id number]) Endocrine Complaints and Symptoms: Negative for: Hepatitis; Thyroid disease; Polydypsia (Excessive Thirst) Medical History: Positive for: Type II Diabetes Negative for: Type I Diabetes Time with diabetes: 4 years Treated with: Insulin, Oral agents Genitourinary Complaints and Symptoms: Negative for: Kidney failure/ Dialysis; Incontinence/dribbling Medical History: Negative for: End Stage Renal Disease Immunological Complaints and Symptoms: Negative for: Hives; Itching Medical History: Negative for: Lupus Erythematosus; Raynaudos; Scleroderma Integumentary (Skin) Complaints and Symptoms: Positive for: Wounds - dibetic foot ulcers; Breakdown; Swelling Negative for: Bleeding or bruising tendency Medical History: Negative for: History of Burn; History of pressure wounds Musculoskeletal Complaints and Symptoms: Negative for: Muscle Pain; Muscle Weakness Medical History: Negative for: Gout; Rheumatoid Arthritis; Osteoarthritis; Osteomyelitis Neurologic Complaints and Symptoms: Negative for: Numbness/parasthesias; Focal/Weakness Medical History: Negative for: Dementia; Neuropathy; Quadriplegia; Paraplegia; Seizure Disorder Psychiatric Complaints and Symptoms: Negative  for: Anxiety; Claustrophobia Medical History: Negative for: Anorexia/bulimia; Confinement Anxiety Oncologic Medical History: Positive for: Received Chemotherapy Negative for: Received Radiation HBO Extended History Items Eyes: Cataracts [place], Torey A. ([id number]) Immunizations Pneumococcal Vaccine: Received Pneumococcal Vaccination: Yes Implantable Devices None Hospitalization / Surgery History Type of Hospitalization/Surgery [facility] weakness Family and Social History Cancer: Yes - Father,Mother; Diabetes: Yes - Father,Mother,Siblings; Heart Disease: No; Hereditary Spherocytosis: No; Hypertension: Yes - Mother,Father; Kidney Disease: No; Lung Disease: Yes - Father; Seizures: No; Stroke: No; Thyroid Problems: Yes - Siblings; Tuberculosis: No; Former smoker - 45 years; Marital Status - Single; Alcohol Use: Never; Drug Use: No History; Caffeine Use: Daily; Financial Concerns: No; Food, Clothing or Shelter Needs: No; Support System Lacking: No; Transportation Concerns: No Electronic Signature(s) Signed: 03/26/2020 4:25:47 PM By: Worthy Keeler PA-C Signed: 03/27/2020 11:15:54 AM By: Darci Needle Signed: 03/27/2020 7:43:28 PM By: Gretta Cool, BSN, RN, CWS, Kim RN, BSN Entered By: Darci Needle on 03/26/2020 14:25:24 Knaus, [name] ([id number]) -------------------------------------------------------------------------------- SuperBill Details Patient Name: Gloria Both A. Date of Service: 03/26/2020 Medical Record Number: [id number] Patient Account Number: [id number] Date of Birth/Sex: 15-Jan-1956 (64 y.o. F) Treating RN: Grover Canavan Primary Care Provider: [name] Other Clinician: Referring Provider: [name] Treating Provider/Extender: Melburn Hake, Selestino Nila Weeks in Treatment: 0 Diagnosis Coding ICD-10 Codes Code Description E11.621 Type 2 diabetes mellitus with foot ulcer L97.522 Non-pressure chronic ulcer of other part of left foot with fat layer exposed I10  Essential (primary) hypertension I50.42 Chronic combined systolic (congestive) and diastolic (congestive) heart failure Facility Procedures CPT4 Code: 80998338 Description: 99214 - WOUND CARE VISIT-LEV 4 EST PT Modifier: Quantity: 1 Physician Procedures CPT4 Code: [id number] Description: 99214 - WC PHYS LEVEL 4 - EST PT Modifier: Quantity: 1 CPT4 Code: Description: ICD-10 Diagnosis Description E11.621 Type 2 diabetes mellitus with foot ulcer L97.522 Non-pressure chronic ulcer of other part of left foot with fat layer e I10 Essential (primary) hypertension I50.42 Chronic combined systolic (congestive)  and diastolic (congestive) hear Modifier: xposed t failure Quantity: Electronic Signature(s) Signed: 03/26/2020 3:19:42 PM By: Worthy Keeler PA-C Entered By: Worthy Keeler on 03/26/2020 15:19:41

## 2020-03-29 ENCOUNTER — Inpatient Hospital Stay
Admission: EM | Admit: 2020-03-29 | Discharge: 2020-04-09 | DRG: 854 | Disposition: A | Discharge status: Skilled Nursing Facility | Payer: Medicare HMO | Attending: Hospitalist | Admitting: Hospitalist

## 2020-03-29 ENCOUNTER — Inpatient Hospital Stay: Payer: Medicare HMO

## 2020-03-29 ENCOUNTER — Other Ambulatory Visit: Payer: Self-pay

## 2020-03-29 ENCOUNTER — Emergency Department: Payer: Medicare HMO

## 2020-03-29 ENCOUNTER — Other Ambulatory Visit (INDEPENDENT_AMBULATORY_CARE_PROVIDER_SITE_OTHER): Payer: Self-pay | Admitting: Physician Assistant

## 2020-03-29 ENCOUNTER — Encounter (INDEPENDENT_AMBULATORY_CARE_PROVIDER_SITE_OTHER): Payer: Self-pay

## 2020-03-29 DIAGNOSIS — J910 Malignant pleural effusion: Secondary | ICD-10-CM | POA: Diagnosis not present

## 2020-03-29 DIAGNOSIS — Z9842 Cataract extraction status, left eye: Secondary | ICD-10-CM | POA: Diagnosis not present

## 2020-03-29 DIAGNOSIS — I5022 Chronic systolic (congestive) heart failure: Secondary | ICD-10-CM | POA: Diagnosis present

## 2020-03-29 DIAGNOSIS — N179 Acute kidney failure, unspecified: Secondary | ICD-10-CM | POA: Diagnosis present

## 2020-03-29 DIAGNOSIS — Z170 Estrogen receptor positive status [ER+]: Secondary | ICD-10-CM | POA: Diagnosis not present

## 2020-03-29 DIAGNOSIS — F419 Anxiety disorder, unspecified: Secondary | ICD-10-CM | POA: Diagnosis present

## 2020-03-29 DIAGNOSIS — Z20822 Contact with and (suspected) exposure to covid-19: Secondary | ICD-10-CM | POA: Diagnosis present

## 2020-03-29 DIAGNOSIS — I10 Essential (primary) hypertension: Secondary | ICD-10-CM | POA: Diagnosis present

## 2020-03-29 DIAGNOSIS — C7970 Secondary malignant neoplasm of unspecified adrenal gland: Secondary | ICD-10-CM | POA: Diagnosis present

## 2020-03-29 DIAGNOSIS — Z803 Family history of malignant neoplasm of breast: Secondary | ICD-10-CM

## 2020-03-29 DIAGNOSIS — M86172 Other acute osteomyelitis, left ankle and foot: Secondary | ICD-10-CM | POA: Diagnosis not present

## 2020-03-29 DIAGNOSIS — Z9851 Tubal ligation status: Secondary | ICD-10-CM | POA: Diagnosis not present

## 2020-03-29 DIAGNOSIS — Z87891 Personal history of nicotine dependence: Secondary | ICD-10-CM

## 2020-03-29 DIAGNOSIS — E1122 Type 2 diabetes mellitus with diabetic chronic kidney disease: Secondary | ICD-10-CM | POA: Diagnosis present

## 2020-03-29 DIAGNOSIS — N185 Chronic kidney disease, stage 5: Secondary | ICD-10-CM | POA: Diagnosis present

## 2020-03-29 DIAGNOSIS — F329 Major depressive disorder, single episode, unspecified: Secondary | ICD-10-CM | POA: Diagnosis present

## 2020-03-29 DIAGNOSIS — N183 Chronic kidney disease, stage 3 unspecified: Secondary | ICD-10-CM | POA: Diagnosis present

## 2020-03-29 DIAGNOSIS — C7900 Secondary malignant neoplasm of unspecified kidney and renal pelvis: Secondary | ICD-10-CM | POA: Diagnosis present

## 2020-03-29 DIAGNOSIS — C50912 Malignant neoplasm of unspecified site of left female breast: Secondary | ICD-10-CM

## 2020-03-29 DIAGNOSIS — F32A Depression, unspecified: Secondary | ICD-10-CM | POA: Diagnosis not present

## 2020-03-29 DIAGNOSIS — C7951 Secondary malignant neoplasm of bone: Secondary | ICD-10-CM | POA: Diagnosis present

## 2020-03-29 DIAGNOSIS — A4102 Sepsis due to Methicillin resistant Staphylococcus aureus: Secondary | ICD-10-CM | POA: Diagnosis present

## 2020-03-29 DIAGNOSIS — Z882 Allergy status to sulfonamides status: Secondary | ICD-10-CM

## 2020-03-29 DIAGNOSIS — I130 Hypertensive heart and chronic kidney disease with heart failure and stage 1 through stage 4 chronic kidney disease, or unspecified chronic kidney disease: Secondary | ICD-10-CM | POA: Diagnosis present

## 2020-03-29 DIAGNOSIS — E1152 Type 2 diabetes mellitus with diabetic peripheral angiopathy with gangrene: Secondary | ICD-10-CM | POA: Diagnosis present

## 2020-03-29 DIAGNOSIS — N2581 Secondary hyperparathyroidism of renal origin: Secondary | ICD-10-CM | POA: Diagnosis present

## 2020-03-29 DIAGNOSIS — I340 Nonrheumatic mitral (valve) insufficiency: Secondary | ICD-10-CM | POA: Diagnosis present

## 2020-03-29 DIAGNOSIS — Z9841 Cataract extraction status, right eye: Secondary | ICD-10-CM

## 2020-03-29 DIAGNOSIS — E872 Acidosis, unspecified: Secondary | ICD-10-CM | POA: Diagnosis present

## 2020-03-29 DIAGNOSIS — I152 Hypertension secondary to endocrine disorders: Secondary | ICD-10-CM | POA: Diagnosis present

## 2020-03-29 DIAGNOSIS — E1169 Type 2 diabetes mellitus with other specified complication: Secondary | ICD-10-CM | POA: Diagnosis present

## 2020-03-29 DIAGNOSIS — D638 Anemia in other chronic diseases classified elsewhere: Secondary | ICD-10-CM | POA: Diagnosis present

## 2020-03-29 DIAGNOSIS — C799 Secondary malignant neoplasm of unspecified site: Secondary | ICD-10-CM | POA: Diagnosis not present

## 2020-03-29 DIAGNOSIS — C50212 Malignant neoplasm of upper-inner quadrant of left female breast: Secondary | ICD-10-CM | POA: Diagnosis present

## 2020-03-29 DIAGNOSIS — I96 Gangrene, not elsewhere classified: Secondary | ICD-10-CM

## 2020-03-29 DIAGNOSIS — N184 Chronic kidney disease, stage 4 (severe): Secondary | ICD-10-CM | POA: Diagnosis present

## 2020-03-29 DIAGNOSIS — J45909 Unspecified asthma, uncomplicated: Secondary | ICD-10-CM | POA: Diagnosis present

## 2020-03-29 DIAGNOSIS — Z79899 Other long term (current) drug therapy: Secondary | ICD-10-CM

## 2020-03-29 DIAGNOSIS — A419 Sepsis, unspecified organism: Secondary | ICD-10-CM | POA: Diagnosis present

## 2020-03-29 DIAGNOSIS — M869 Osteomyelitis, unspecified: Secondary | ICD-10-CM | POA: Diagnosis present

## 2020-03-29 DIAGNOSIS — Z91013 Allergy to seafood: Secondary | ICD-10-CM

## 2020-03-29 DIAGNOSIS — Z961 Presence of intraocular lens: Secondary | ICD-10-CM | POA: Diagnosis present

## 2020-03-29 DIAGNOSIS — E11621 Type 2 diabetes mellitus with foot ulcer: Secondary | ICD-10-CM | POA: Diagnosis present

## 2020-03-29 DIAGNOSIS — C50919 Malignant neoplasm of unspecified site of unspecified female breast: Secondary | ICD-10-CM | POA: Diagnosis present

## 2020-03-29 DIAGNOSIS — Z794 Long term (current) use of insulin: Secondary | ICD-10-CM

## 2020-03-29 DIAGNOSIS — L0390 Cellulitis, unspecified: Secondary | ICD-10-CM | POA: Diagnosis not present

## 2020-03-29 DIAGNOSIS — R6520 Severe sepsis without septic shock: Secondary | ICD-10-CM | POA: Diagnosis not present

## 2020-03-29 DIAGNOSIS — M79606 Pain in leg, unspecified: Secondary | ICD-10-CM

## 2020-03-29 DIAGNOSIS — Z7982 Long term (current) use of aspirin: Secondary | ICD-10-CM

## 2020-03-29 LAB — CBC WITH DIFFERENTIAL/PLATELET
Abs Immature Granulocytes: 0.21 10*3/uL — ABNORMAL HIGH (ref 0.00–0.07)
Basophils Absolute: 0 10*3/uL (ref 0.0–0.1)
Basophils Relative: 0 %
Eosinophils Absolute: 0 10*3/uL (ref 0.0–0.5)
Eosinophils Relative: 0 %
HCT: 26.1 % — ABNORMAL LOW (ref 36.0–46.0)
Hemoglobin: 8.6 g/dL — ABNORMAL LOW (ref 12.0–15.0)
Immature Granulocytes: 1 %
Lymphocytes Relative: 1 %
Lymphs Abs: 0.2 10*3/uL — ABNORMAL LOW (ref 0.7–4.0)
MCH: 30.1 pg (ref 26.0–34.0)
MCHC: 33 g/dL (ref 30.0–36.0)
MCV: 91.3 fL (ref 80.0–100.0)
Monocytes Absolute: 1.5 10*3/uL — ABNORMAL HIGH (ref 0.1–1.0)
Monocytes Relative: 6 %
Neutro Abs: 23.2 10*3/uL — ABNORMAL HIGH (ref 1.7–7.7)
Neutrophils Relative %: 92 %
Platelets: 359 10*3/uL (ref 150–400)
RBC: 2.86 MIL/uL — ABNORMAL LOW (ref 3.87–5.11)
RDW: 14.6 % (ref 11.5–15.5)
WBC: 25.2 10*3/uL — ABNORMAL HIGH (ref 4.0–10.5)
nRBC: 0 % (ref 0.0–0.2)

## 2020-03-29 LAB — AEROBIC CULTURE W GRAM STAIN (SUPERFICIAL SPECIMEN): Gram Stain: NONE SEEN

## 2020-03-29 LAB — COMPREHENSIVE METABOLIC PANEL
ALT: 55 U/L — ABNORMAL HIGH (ref 0–44)
AST: 78 U/L — ABNORMAL HIGH (ref 15–41)
Albumin: 3 g/dL — ABNORMAL LOW (ref 3.5–5.0)
Alkaline Phosphatase: 94 U/L (ref 38–126)
Anion gap: 11 (ref 5–15)
BUN: 37 mg/dL — ABNORMAL HIGH (ref 8–23)
CO2: 22 mmol/L (ref 22–32)
Calcium: 8.5 mg/dL — ABNORMAL LOW (ref 8.9–10.3)
Chloride: 100 mmol/L (ref 98–111)
Creatinine, Ser: 2.74 mg/dL — ABNORMAL HIGH (ref 0.44–1.00)
GFR calc Af Amer: 21 mL/min — ABNORMAL LOW (ref >60)
GFR calc non Af Amer: 18 mL/min — ABNORMAL LOW (ref >60)
Glucose, Bld: 381 mg/dL — ABNORMAL HIGH (ref 70–99)
Potassium: 4.8 mmol/L (ref 3.5–5.1)
Sodium: 133 mmol/L — ABNORMAL LOW (ref 135–145)
Total Bilirubin: 0.8 mg/dL (ref 0.3–1.2)
Total Protein: 7.7 g/dL (ref 6.5–8.1)

## 2020-03-29 LAB — SEDIMENTATION RATE: Sed Rate: 106 mm/hr — ABNORMAL HIGH (ref 0–30)

## 2020-03-29 LAB — LACTIC ACID, PLASMA
Lactic Acid, Venous: 2.1 mmol/L (ref 0.5–1.9)
Lactic Acid, Venous: 1.4 mmol/L (ref 0.5–1.9)

## 2020-03-29 LAB — C-REACTIVE PROTEIN: CRP: 20.2 mg/dL — ABNORMAL HIGH (ref <1.0)

## 2020-03-29 LAB — SARS CORONAVIRUS 2 BY RT PCR (HOSPITAL ORDER, PERFORMED IN ~~LOC~~ HOSPITAL LAB): SARS Coronavirus 2: NEGATIVE

## 2020-03-29 MED ORDER — VANCOMYCIN HCL IN DEXTROSE 1-5 GM/200ML-% IV SOLN
1000.0000 mg | INTRAVENOUS | Status: DC
  Filled 2020-03-29: qty 200

## 2020-03-29 MED ORDER — BUDESONIDE 0.5 MG/2ML IN SUSP: 2.0000 mL | Freq: Two times a day (BID) | RESPIRATORY_TRACT | Status: DC | PRN

## 2020-03-29 MED ORDER — CLONIDINE HCL 0.1 MG PO TABS
0.2000 mg | ORAL_TABLET | Freq: Two times a day (BID) | ORAL | Status: DC
  Administered 2020-03-30 – 2020-04-09 (×21): 0.2 mg via ORAL
  Filled 2020-03-29 (×21): qty 2

## 2020-03-29 MED ORDER — FERROUS SULFATE 325 (65 FE) MG PO TABS
325.0000 mg | ORAL_TABLET | Freq: Two times a day (BID) | ORAL | Status: DC
  Administered 2020-03-30 – 2020-04-09 (×18): 325 mg via ORAL
  Filled 2020-03-29 (×19): qty 1

## 2020-03-29 MED ORDER — ONDANSETRON HCL 4 MG/2ML IJ SOLN
4.0000 mg | Freq: Four times a day (QID) | INTRAMUSCULAR | Status: DC | PRN
  Administered 2020-03-30 – 2020-04-02 (×2): 4 mg via INTRAVENOUS
  Filled 2020-03-29: qty 2

## 2020-03-29 MED ORDER — SODIUM CHLORIDE 0.9 % IV SOLN
2.0000 g | Freq: Once | INTRAVENOUS | Status: AC
  Administered 2020-03-29: 2 g via INTRAVENOUS
  Filled 2020-03-29: qty 2

## 2020-03-29 MED ORDER — ONDANSETRON HCL 4 MG PO TABS: 4.0000 mg | ORAL_TABLET | Freq: Four times a day (QID) | ORAL | Status: DC | PRN

## 2020-03-29 MED ORDER — SODIUM CHLORIDE 0.9 % IV SOLN
2.0000 g | INTRAVENOUS | Status: DC
  Filled 2020-03-29: qty 2

## 2020-03-29 MED ORDER — FAMOTIDINE 20 MG PO TABS
20.0000 mg | ORAL_TABLET | Freq: Two times a day (BID) | ORAL | Status: DC
  Administered 2020-03-30 – 2020-03-31 (×4): 20 mg via ORAL
  Filled 2020-03-29 (×4): qty 1

## 2020-03-29 MED ORDER — BUMETANIDE 0.5 MG PO TABS: 0.5000 mg | ORAL_TABLET | Freq: Two times a day (BID) | ORAL | Status: DC

## 2020-03-29 MED ORDER — VANCOMYCIN HCL 500 MG/100ML IV SOLN
500.0000 mg | Freq: Once | INTRAVENOUS | Status: AC
  Administered 2020-03-29: 500 mg via INTRAVENOUS
  Filled 2020-03-29: qty 100

## 2020-03-29 MED ORDER — SIMVASTATIN 20 MG PO TABS
20.0000 mg | ORAL_TABLET | Freq: Every evening | ORAL | Status: DC
  Administered 2020-03-29 – 2020-04-08 (×11): 20 mg via ORAL
  Filled 2020-03-29 (×4): qty 1
  Filled 2020-03-29: qty 2
  Filled 2020-03-29 (×7): qty 1

## 2020-03-29 MED ORDER — ACETAMINOPHEN 650 MG RE SUPP: 650.0000 mg | Freq: Four times a day (QID) | RECTAL | Status: DC | PRN

## 2020-03-29 MED ORDER — ALPRAZOLAM 0.5 MG PO TABS: 0.5000 mg | ORAL_TABLET | Freq: Two times a day (BID) | ORAL | Status: DC | PRN

## 2020-03-29 MED ORDER — ACETAMINOPHEN 500 MG PO TABS
1000.0000 mg | ORAL_TABLET | Freq: Once | ORAL | Status: AC
  Administered 2020-03-29: 1000 mg via ORAL
  Filled 2020-03-29: qty 2

## 2020-03-29 MED ORDER — AMLODIPINE BESYLATE 10 MG PO TABS
10.0000 mg | ORAL_TABLET | Freq: Every day | ORAL | Status: DC
  Administered 2020-03-30 – 2020-04-09 (×11): 10 mg via ORAL
  Filled 2020-03-29 (×4): qty 1
  Filled 2020-03-29: qty 2
  Filled 2020-03-29 (×6): qty 1

## 2020-03-29 MED ORDER — ALBUTEROL SULFATE HFA 108 (90 BASE) MCG/ACT IN AERS: 2.0000 | INHALATION_SPRAY | Freq: Four times a day (QID) | RESPIRATORY_TRACT | Status: DC | PRN

## 2020-03-29 MED ORDER — CALCITRIOL 0.25 MCG PO CAPS
0.2500 ug | ORAL_CAPSULE | Freq: Every day | ORAL | Status: DC
  Administered 2020-03-30 – 2020-04-09 (×11): 0.25 ug via ORAL
  Filled 2020-03-29 (×12): qty 1

## 2020-03-29 MED ORDER — ASPIRIN EC 81 MG PO TBEC
81.0000 mg | DELAYED_RELEASE_TABLET | Freq: Every day | ORAL | Status: DC
  Administered 2020-03-30 – 2020-04-09 (×11): 81 mg via ORAL
  Filled 2020-03-29 (×10): qty 1

## 2020-03-29 MED ORDER — DOCUSATE SODIUM 100 MG PO CAPS
100.0000 mg | ORAL_CAPSULE | Freq: Two times a day (BID) | ORAL | Status: DC
  Administered 2020-03-30 – 2020-04-09 (×17): 100 mg via ORAL
  Filled 2020-03-29 (×22): qty 1

## 2020-03-29 MED ORDER — INSULIN DETEMIR 100 UNIT/ML ~~LOC~~ SOLN
55.0000 [IU] | Freq: Every day | SUBCUTANEOUS | Status: DC
  Administered 2020-03-30 – 2020-04-03 (×3): 55 [IU] via SUBCUTANEOUS
  Filled 2020-03-29 (×5): qty 0.55

## 2020-03-29 MED ORDER — LACTATED RINGERS IV BOLUS (SEPSIS)
600.0000 mL | Freq: Once | INTRAVENOUS | Status: AC
  Administered 2020-03-29: 600 mL via INTRAVENOUS

## 2020-03-29 MED ORDER — ENOXAPARIN SODIUM 30 MG/0.3ML ~~LOC~~ SOLN
30.0000 mg | SUBCUTANEOUS | Status: DC
  Administered 2020-03-30 – 2020-04-02 (×5): 30 mg via SUBCUTANEOUS
  Filled 2020-03-29 (×7): qty 0.3

## 2020-03-29 MED ORDER — ACETAMINOPHEN-CODEINE #4 300-60 MG PO TABS
1.0000 | ORAL_TABLET | Freq: Two times a day (BID) | ORAL | Status: DC | PRN
Start: 2020-03-29 — End: 2020-03-29

## 2020-03-29 MED ORDER — SODIUM CHLORIDE 0.9 % IV SOLN: INTRAVENOUS | Status: DC

## 2020-03-29 MED ORDER — ACETAMINOPHEN 325 MG PO TABS: 650.0000 mg | ORAL_TABLET | Freq: Four times a day (QID) | ORAL | Status: DC | PRN

## 2020-03-29 MED ORDER — VANCOMYCIN HCL IN DEXTROSE 1-5 GM/200ML-% IV SOLN: 1000.0000 mg | Freq: Once | INTRAVENOUS | Status: DC

## 2020-03-29 MED ORDER — LACTATED RINGERS IV BOLUS (SEPSIS)
1000.0000 mL | Freq: Once | INTRAVENOUS | Status: AC
  Administered 2020-03-29: 1000 mL via INTRAVENOUS

## 2020-03-29 MED ORDER — FLUOXETINE HCL 20 MG PO CAPS
20.0000 mg | ORAL_CAPSULE | Freq: Two times a day (BID) | ORAL | Status: DC
  Administered 2020-03-30 – 2020-04-09 (×20): 20 mg via ORAL
  Filled 2020-03-29 (×23): qty 1

## 2020-03-29 MED ORDER — ATENOLOL 100 MG PO TABS
100.0000 mg | ORAL_TABLET | Freq: Two times a day (BID) | ORAL | Status: DC
  Administered 2020-03-30 – 2020-04-09 (×20): 100 mg via ORAL
  Filled 2020-03-29 (×21): qty 1

## 2020-03-29 MED ORDER — ALBUTEROL SULFATE (2.5 MG/3ML) 0.083% IN NEBU: 2.5000 mg | INHALATION_SOLUTION | Freq: Four times a day (QID) | RESPIRATORY_TRACT | Status: DC | PRN

## 2020-03-29 MED ORDER — SODIUM CHLORIDE 0.9 % IV BOLUS (SEPSIS)
500.0000 mL | Freq: Once | INTRAVENOUS | Status: AC
  Administered 2020-03-29: 500 mL via INTRAVENOUS

## 2020-03-29 MED ORDER — METRONIDAZOLE IN NACL 5-0.79 MG/ML-% IV SOLN
500.0000 mg | Freq: Three times a day (TID) | INTRAVENOUS | Status: DC
  Administered 2020-03-29: 500 mg via INTRAVENOUS
  Filled 2020-03-29: qty 100

## 2020-03-29 MED ORDER — SENNA 8.6 MG PO TABS
1.0000 | ORAL_TABLET | Freq: Two times a day (BID) | ORAL | Status: DC
  Administered 2020-03-30 – 2020-04-09 (×16): 8.6 mg via ORAL
  Filled 2020-03-29 (×20): qty 1

## 2020-03-29 MED ORDER — OXYCODONE HCL 5 MG PO TABS
5.0000 mg | ORAL_TABLET | Freq: Three times a day (TID) | ORAL | Status: DC | PRN
  Administered 2020-03-30 – 2020-04-08 (×13): 5 mg via ORAL
  Filled 2020-03-29 (×14): qty 1

## 2020-03-29 MED ORDER — VANCOMYCIN HCL IN DEXTROSE 1-5 GM/200ML-% IV SOLN
1000.0000 mg | Freq: Once | INTRAVENOUS | Status: AC
  Administered 2020-03-29: 1000 mg via INTRAVENOUS
  Filled 2020-03-29: qty 200

## 2020-03-29 NOTE — Progress Notes (Signed)
PHARMACY -  BRIEF ANTIBIOTIC NOTE   Pharmacy has received consult(s) for Vancomycin and Cefepime from an ED provider.  The patient's profile has been reviewed for ht/wt/allergies/indication/available labs.    One time order(s) placed for Vancomycin 1000mg  and cefepime 2g IV. Will order an additional vancomycin 500mg  for total load dose of 1500mg .   Further antibiotics/pharmacy consults should be ordered by admitting physician if indicated.                       Thank you, Pernell Dupre, PharmD, BCPS Clinical Pharmacist 03/29/2020 3:43 PM

## 2020-03-29 NOTE — ED Provider Notes (Signed)
[facility] Emergency Department Provider Note  ____________________________________________   First MD Initiated Contact with Patient 03/29/20 1518     (approximate)  I have reviewed the triage vital signs and the nursing notes.   HISTORY  Chief Complaint Shortness of Breath    HPI [name] is a 64 y.o. female  With PMHx metastatic breast CA, DM, CKD, CHF, here with open, draining foot wound, and fever. Pt states that she recently re-established care with the wound clinic for a woudn to her left foot. It has been present for "a while" but has become increasingly malodorous and is spreading over past several days. She had been on keflex and was switched to Bactrim. Over the past 24 hours, she has developed fever, chills, and body aches, along with transient SOB though no cough. She states she "could not get warm" today so presents to the ED. Denies any significant pain 2/2 neuropathy in her feet. She has a h/o wound to the R foot which has healed. She endorses some spreading redness, swelling along her left foot and lower leg over past 48 hours.         Past Medical History:  Diagnosis Date  . Anemia   . Anxiety   . Asthma   . Cancer ([place]) 03/10/2018   Per NM PET order. Carcinoma of upper-inner quadrant of left breast in female, estrogen receptor positive .  Marland Kitchen Cancer (HCC)    LUNG  . CHF (congestive heart failure) ([place]) 1997  . CKD (chronic kidney disease)   . Depression   . Diabetes mellitus, type 2 ([place])   . Family history of breast cancer   . Family history of colon cancer   . Family history of ovarian cancer   . Family history of pancreatic cancer   . Family history of prostate cancer   . Family history of stomach cancer   . GERD (gastroesophageal reflux disease)    history of an ulcer  . Hair loss   . History of left breast cancer 05/29/14  . History of partial hysterectomy 12/31/2016   Per patient.  Has not had a period in years.   Had a partial hysterectomy years ago.  Marland Kitchen Hypertension   . Mitral valve regurgitation   . Neuromuscular disorder (HCC)    neuropathies in hand  . Obesity   . Pancreatitis 1997  . Stroke [facility]) 2010   with mild left arm weakness    Patient Active Problem List   Diagnosis Date Noted  . Sepsis ([place]) 03/29/2020  . Antineoplastic chemotherapy induced anemia 03/02/2020  . Weakness generalized 03/02/2020  . Fall at home 03/02/2020  . Tear of meniscus of knee 11/17/2019  . Metabolic acidosis   . Acute renal failure superimposed on stage 4 chronic kidney disease ([place]) 09/27/2019  . Metastatic breast cancer ([place]) 09/27/2019  . Hypertension associated with diabetes ([place]) 09/27/2019  . Hyperlipidemia associated with type 2 diabetes mellitus ([place]) 09/27/2019  . Hyperkalemia 09/27/2019  . Taking a statin medication 05/18/2019  . Anemia of chronic disease 04/27/2019  . Benign hypertensive kidney disease with chronic kidney disease 04/27/2019  . Hyposmolality and/or hyponatremia 04/27/2019  . Proteinuria 04/27/2019  . Secondary hyperparathyroidism of renal origin ([place]) 04/27/2019  . Goals of care, counseling/discussion 10/01/2018  . Major depressive disorder, recurrent, in remission ([place]) 09/13/2018  . Metastasis from malignant tumor of breast ([place]) 09/13/2018  . Genetic testing 07/02/2018  . Family history of breast cancer   . Family  history of ovarian cancer   . Family history of pancreatic cancer   . Family history of colon cancer   . Family history of stomach cancer   . Family history of prostate cancer   . Carcinoma of upper-inner quadrant of left breast in female, estrogen receptor positive ([place]) 05/23/2016  . Type 2 diabetes mellitus with diabetic chronic kidney disease ([place]) 08/27/2015  . Cerebrovascular accident (CVA) ([address]) 08/27/2015  . Benign essential HTN 12/15/2014  . Chronic systolic CHF (congestive heart failure) ([place]) 06/21/2014  . Combined fat and carbohydrate induced  hyperlipemia 06/21/2014  . MI (mitral incompetence) 06/21/2014  . Asthma 06/09/2014  . Chronic kidney disease (CKD), stage V ([place]) 06/09/2014  . Depression with anxiety 06/09/2014    Past Surgical History:  Procedure Laterality Date  . CATARACT EXTRACTION W/PHACO Right 02/24/2019   Procedure: CATARACT EXTRACTION PHACO AND INTRAOCULAR LENS PLACEMENT ([place]) RIGHT DIABETES;  Surgeon: Marchia Meiers, MD;  Location: [facility] ORS;  Service: Ophthalmology;  Laterality: Right;  [place] 01:13.0 CDE 7.96 Fluid Pack Lot # [id number]  . CATARACT EXTRACTION W/PHACO Left 03/24/2019   Procedure: CATARACT EXTRACTION PHACO AND INTRAOCULAR LENS PLACEMENT (I[facility]) - left diabetic;  Surgeon: Marchia Meiers, MD;  Location: [facility] ORS;  Service: Ophthalmology;  Laterality: Left;  [place]  01:36 CDE 13.93 Fluid pack lot # [id number]  . CESAREAN SECTION    . CHOLECYSTECTOMY    . EXCISION OF TONGUE LESION N/A 08/17/2018   Procedure: EXCISION OF TONGUE LESION WITH FROZEN SECTIONS;  Surgeon: Beverly Gust, MD;  Location: [facility] ORS;  Service: ENT;  Laterality: N/A;  . EYE SURGERY Right    cataract extraction  . PARTIAL HYSTERECTOMY  12/31/2016   Per patient, she has not had a period in years since she had a partial hysterectomy.  Marland Kitchen PORTA CATH INSERTION    . TUBAL LIGATION      Prior to Admission medications   Medication Sig Start Date End Date Taking? Authorizing Provider  acetaminophen-codeine (TYLENOL #4) 300-60 MG tablet Take 1 tablet by mouth 2 (two) times daily as needed for moderate pain.    Yes [provider]  albuterol (PROAIR HFA) 108 (90 BASE) MCG/ACT inhaler Inhale 2 puffs into the lungs every 6 (six) hours as needed for wheezing or shortness of breath.  06/20/14  Yes [provider]  ALPRAZolam Duanne Moron) 0.5 MG tablet Take 0.5 mg by mouth 2 (two) times daily as needed for anxiety or sleep.    Yes [provider]  amLODipine (NORVASC) 10 MG tablet Take 10 mg by mouth daily.  06/21/14  Yes [provider]  aspirin EC 81 MG tablet Take 81 mg by mouth daily.  06/21/14  Yes [provider]  atenolol (TENORMIN) 100 MG tablet Take 100 mg by mouth 2 (two) times daily.  04/07/14  Yes [provider]  bumetanide (BUMEX) 0.5 MG tablet Take 0.5 mg by mouth 2 (two) times daily.  08/22/14  Yes [provider]  calcitRIOL (ROCALTROL) 0.25 MCG capsule Take 0.25 mcg by mouth daily.  09/21/16  Yes [provider]  Cinnamon 500 MG capsule Take 500 mg by mouth 2 (two) times daily.    Yes [provider]  cloNIDine (CATAPRES) 0.2 MG tablet Take 0.2 mg by mouth 2 (two) times daily.  06/21/14  Yes [provider]  doxycycline (VIBRAMYCIN) 100 MG capsule TAKE 1 CAPSULE BY MOUTH TWICE DAILY FOR 14 DAYS 03/26/20 04/09/20 Yes [provider]  enalapril (VASOTEC) 10 MG tablet Take  20 mg by mouth 2 (two) times a day.  01/12/19  Yes [provider]  famotidine (PEPCID) 20 MG tablet Take 20 mg by mouth 2 (two) times daily.  09/14/19  Yes [provider]  ferrous sulfate 325 (65 FE) MG tablet Take 325 mg by mouth daily with breakfast.   Yes [provider]  FLUoxetine (PROZAC) 20 MG capsule Take 20 mg by mouth 2 (two) times daily.  06/21/14  Yes [provider]  gabapentin (NEURONTIN) 100 MG capsule TAKE 1 CAPSULE(100 MG) BY MOUTH AT BEDTIME 11/04/19  Yes Cammie Sickle, MD  glyBURIDE (DIABETA) 5 MG tablet Take 10 mg by mouth 2 (two) times daily with a meal.  06/20/14  Yes [provider]  LEVEMIR FLEXTOUCH 100 UNIT/ML Pen Inject 55 Units into the skin daily.  05/09/15  Yes [provider]  loperamide (IMODIUM A-D) 2 MG capsule Take 2-4 mg by mouth 4 (four) times daily as needed for diarrhea or loose stools.    Yes [provider]  NOVOLOG FLEXPEN 100 UNIT/ML FlexPen Inject 7 Units into the skin 2 (two) times daily.  11/09/15  Yes [provider]  simvastatin (ZOCOR) 20 MG tablet Take 20  mg by mouth every evening.    Yes [provider]  vitamin B-12 (CYANOCOBALAMIN) 1000 MCG tablet Take 1,000 mcg by mouth daily.   Yes [provider]    Allergies Fish-derived products and Sulfamethoxazole-trimethoprim  Family History  Problem Relation Age of Onset  . Ovarian cancer Mother 78  . Diabetes Mother   . Hypertension Mother   . COPD Father   . Hypertension Father   . Colon cancer Father 64  . Diabetes Sister   . Breast cancer Sister 45       bilateral  . Diabetes Brother   . Leukemia Maternal Aunt   . Pancreatic cancer Paternal Aunt 8  . Pancreatic cancer Paternal Uncle   . Colon cancer Paternal Uncle   . Stomach cancer Maternal Grandfather 7  . Throat cancer Paternal Grandmother   . Breast cancer Maternal Aunt 80  . Colon cancer Maternal Aunt   . Bone cancer Maternal Aunt   . Breast cancer Paternal Aunt        dx >50  . Prostate cancer Paternal Uncle   . Pancreatic cancer Paternal Uncle   . Throat cancer Paternal Uncle   . Lung cancer Paternal Uncle   . Stomach cancer Paternal Uncle   . Brain cancer Paternal Aunt   . Cancer Cousin        liver, kidney  . Prostate cancer Cousin        meastatic  . Lung cancer Other     Social History Social History   Tobacco Use  . Smoking status: Former Smoker    Packs/day: 0.50    Years: 1.00    Pack years: 0.50    Types: Cigarettes  . Smokeless tobacco: Never Used  Vaping Use  . Vaping Use: Never used  Substance Use Topics  . Alcohol use: No    Alcohol/week: 0.0 standard drinks  . Drug use: No    Review of Systems  Review of Systems  Constitutional: Positive for chills, fatigue and fever.  HENT: Negative for congestion and sore throat.   Eyes: Negative for visual disturbance.  Respiratory: Negative for cough and shortness of breath.   Cardiovascular: Positive for leg swelling. Negative for chest pain.  Gastrointestinal: Negative for abdominal pain, diarrhea, nausea and  vomiting.   Genitourinary: Negative for flank pain.  Musculoskeletal: Negative for back pain and neck pain.  Skin: Positive for rash and wound.  Neurological: Negative for weakness.  All other systems reviewed and are negative.    ____________________________________________  PHYSICAL EXAM:      VITAL SIGNS: ED Triage Vitals  Enc Vitals Group     BP 03/29/20 1116 134/75     Pulse Rate 03/29/20 1116 92     Resp 03/29/20 1116 18     Temp 03/29/20 1116 (!) 101.9 F (38.8 C)     Temp Source 03/29/20 1116 Oral     SpO2 03/29/20 1116 95 %     Weight 03/29/20 1119 194 lb (88 kg)     Height 03/29/20 1119 5\' 2"  (1.575 m)     Head Circumference --      Peak Flow --      Pain Score 03/29/20 1118 0     Pain Loc --      Pain Edu? --      Excl. in [place]? --      Physical Exam Vitals and nursing note reviewed.  Constitutional:      General: She is not in acute distress.    Appearance: She is well-developed.  HENT:     Head: Normocephalic and atraumatic.  Eyes:     Conjunctiva/sclera: Conjunctivae normal.  Cardiovascular:     Rate and Rhythm: Normal rate and regular rhythm.     Heart sounds: Normal heart sounds.  Pulmonary:     Effort: Pulmonary effort is normal. No respiratory distress.     Breath sounds: No wheezing.  Abdominal:     General: There is no distension.  Musculoskeletal:     Cervical back: Neck supple.  Skin:    General: Skin is warm.     Capillary Refill: Capillary refill takes less than 2 seconds.     Findings: No rash.  Neurological:     Mental Status: She is alert and oriented to person, place, and time.     Motor: No abnormal muscle tone.      LOWER EXTREMITY EXAM: LEFT  INSPECTION & PALPATION: Malodorous open ulcerations to lateral aspect of left foot, with some areas of apparent skin necrosis. Redness tracking to the dorsum of the foot and up to the mid lower lower.  SENSORY: sensation is intact to light touch in:  Superficial peroneal nerve distribution  (over dorsum of foot) Deep peroneal nerve distribution (over first dorsal web space) Sural nerve distribution (over lateral aspect 5th metatarsal) Saphenous nerve distribution (over medial instep)  MOTOR:  + Motor EHL (great toe dorsiflexion) + FHL (great toe plantar flexion)  + TA (ankle dorsiflexion)  + GSC (ankle plantar flexion)  VASCULAR: 2+ dorsalis pedis and posterior tibialis pulses Capillary refill < 2 sec, toes warm and well-perfused  COMPARTMENTS: Soft, warm, well-perfused No pain with passive extension No parethesias       ____________________________________________   LABS (all labs ordered are listed, but only abnormal results are displayed)  Labs Reviewed  LACTIC ACID, PLASMA - Abnormal; Notable for the following components:      Result Value   Lactic Acid, Venous 2.1 (*)    All other components within normal limits  COMPREHENSIVE METABOLIC PANEL - Abnormal; Notable for the following components:   Sodium 133 (*)    Glucose, Bld 381 (*)    BUN 37 (*)    Creatinine, Ser 2.74 (*)    Calcium 8.5 (*)  Albumin 3.0 (*)    AST 78 (*)    ALT 55 (*)    GFR calc non Af Amer 18 (*)    GFR calc Af Amer 21 (*)    All other components within normal limits  CBC WITH DIFFERENTIAL/PLATELET - Abnormal; Notable for the following components:   WBC 25.2 (*)    RBC 2.86 (*)    Hemoglobin 8.6 (*)    HCT 26.1 (*)    Neutro Abs 23.2 (*)    Lymphs Abs 0.2 (*)    Monocytes Absolute 1.5 (*)    Abs Immature Granulocytes 0.21 (*)    All other components within normal limits  C-REACTIVE PROTEIN - Abnormal; Notable for the following components:   CRP 20.2 (*)    All other components within normal limits  SEDIMENTATION RATE - Abnormal; Notable for the following components:   Sed Rate 106 (*)    All other components within normal limits  SARS CORONAVIRUS 2 BY RT PCR (HOSPITAL ORDER, [place])  CULTURE, BLOOD (ROUTINE X 2)  CULTURE, BLOOD  (ROUTINE X 2)  CULTURE, BLOOD (SINGLE)  LACTIC ACID, PLASMA  PROTIME-INR  CORTISOL-AM, BLOOD  PROCALCITONIN  CBC  CREATININE, SERUM  COMPREHENSIVE METABOLIC PANEL  CBC    ____________________________________________  EKG: Normal sinus rhythm, VR 91. QRS 80, QTc 462. No acute St elevations or depressions. ________________________________________  RADIOLOGY All imaging, including plain films, CT scans, and ultrasounds, independently reviewed by me, and interpretations confirmed via formal radiology reads.  ED MD interpretation:   CXR: Clear, no focal abnormality XR Foot: soft tissue swelling, subQ air  Official radiology report(s): DG Chest 2 View  Result Date: 03/29/2020 CLINICAL DATA:  Shortness of breath and dizziness.  Breast carcinoma EXAM: CHEST - 2 VIEW COMPARISON:  Chest radiograph September 27, 2019; PET-CT March 28, 2020 FINDINGS: There are pleural effusions bilaterally with bibasilar atelectatic change. The masslike area in the lingula seen on recent PET study is appreciable by radiography measuring 1.4 x 1.3 cm. Lungs elsewhere are clear. Heart is upper normal in size with pulmonary vascularity normal. Port-A-Cath tip is in the superior vena cava. No adenopathy is evident by radiography. IMPRESSION: Stable nodular opacity in the lingula measuring 1.4 x 1.3 cm. Bilateral pleural effusions with bibasilar atelectasis. No new opacity evident compared to recent PET study. Heart upper normal in size.  Port-A-Cath tip in superior vena cava. Electronically Signed   By: Lowella Grip III M.D.   On: 03/29/2020 12:33   DG Foot Complete Left  Result Date: 03/29/2020 CLINICAL DATA:  Left foot wound, initial encounter EXAM: LEFT FOOT - COMPLETE 3+ VIEW COMPARISON:  None. FINDINGS: Considerable soft tissue swelling is noted in the distal aspect of the foot. Some subcutaneous air is noted about the fifth MTP joint. These changes correspond to the given clinical history of localized  infection. No definitive fracture or bony erosive changes are noted. No other focal abnormality is seen. IMPRESSION: Soft tissue swelling and subcutaneous air consistent with localized infection predominately about the fifth MTP joint. No definitive erosive changes are noted. If clinically indicated MRI may be helpful for further evaluation. Electronically Signed   By: Inez Catalina M.D.   On: 03/29/2020 15:56    ____________________________________________  PROCEDURES   Procedure(s) performed (including Critical Care):  .Critical Care Performed by: Duffy Bruce, MD Authorized by: Duffy Bruce, MD   Critical care provider statement:    Critical care time (minutes):  35  Critical care time was exclusive of:  Separately billable procedures and treating other patients and teaching time   Critical care was necessary to treat or prevent imminent or life-threatening deterioration of the following conditions:  Circulatory failure, cardiac failure and sepsis   Critical care was time spent personally by me on the following activities:  Development of treatment plan with patient or surrogate, discussions with consultants, evaluation of patient's response to treatment, examination of patient, obtaining history from patient or surrogate, ordering and performing treatments and interventions, ordering and review of laboratory studies, ordering and review of radiographic studies, pulse oximetry, re-evaluation of patient's condition and review of old charts   I assumed direction of critical care for this patient from another provider in my specialty: no      ____________________________________________  INITIAL IMPRESSION / MDM / [place] / ED COURSE  As part of my medical decision making, I reviewed the following data within the [place] notes reviewed and incorporated, Old chart reviewed, Notes from prior ED visits, and [place] Controlled Substance Database        *Makinzie A Saia was evaluated in Emergency Department on 03/30/2020 for the symptoms described in the history of present illness. She was evaluated in the context of the global COVID-19 pandemic, which necessitated consideration that the patient might be at risk for infection with the SARS-CoV-2 virus that causes COVID-19. Institutional protocols and algorithms that pertain to the evaluation of patients at risk for COVID-19 are in a state of rapid change based on information released by regulatory bodies including the CDC and federal and state organizations. These policies and algorithms were followed during the patient's care in the ED.  Some ED evaluations and interventions may be delayed as a result of limited staffing during the pandemic.*     Medical Decision Making:  64 yo F here with sepsis 2/2 cellulitis and open wound of left foot. Pt has extensive PMHx as above and unfortunately appears to have a gangrenous, acutely infected wound of her left lateral foot/fifth digit. Pt started on sepsis protocol with IVF, antibiotics, and monitoring. H/o CHF, edema, and LA < 4 without clinical signs of shock so will be cautious with fluids. WBC 25.2, renal function is near baseline. Plain films do show some possible joint involvement. Dr. Vickki Muff with Podiatry consulted and will admit to medicine.  ____________________________________________  FINAL CLINICAL IMPRESSION(S) / ED DIAGNOSES  Final diagnoses:  Sepsis due to cellulitis [facility])     MEDICATIONS GIVEN DURING THIS VISIT:  Medications  aspirin EC tablet 81 mg (has no administration in time range)  amLODipine (NORVASC) tablet 10 mg (has no administration in time range)  atenolol (TENORMIN) tablet 100 mg (100 mg Oral Not Given 03/30/20 0124)  cloNIDine (CATAPRES) tablet 0.2 mg (0.2 mg Oral Not Given 03/30/20 0124)  simvastatin (ZOCOR) tablet 20 mg (20 mg Oral Given 03/29/20 1941)  ALPRAZolam (XANAX) tablet 0.5 mg (has no administration in time  range)  FLUoxetine (PROZAC) capsule 20 mg (20 mg Oral Given 03/30/20 0123)  insulin detemir (LEVEMIR) injection 55 Units (has no administration in time range)  calcitRIOL (ROCALTROL) capsule 0.25 mcg (has no administration in time range)  famotidine (PEPCID) tablet 20 mg (20 mg Oral Given 03/30/20 0123)  ferrous sulfate tablet 325 mg (has no administration in time range)  albuterol (PROVENTIL) (2.5 MG/3ML) 0.083% nebulizer solution 2.5 mg (has no administration in time range)  budesonide (PULMICORT) nebulizer solution 0.5 mg (has no administration in time  range)  enoxaparin (LOVENOX) injection 30 mg (30 mg Subcutaneous Given 03/30/20 0122)  0.9 %  sodium chloride infusion (has no administration in time range)  acetaminophen (TYLENOL) tablet 650 mg (has no administration in time range)    Or  acetaminophen (TYLENOL) suppository 650 mg (has no administration in time range)  docusate sodium (COLACE) capsule 100 mg (100 mg Oral Given 03/30/20 0123)  senna (SENOKOT) tablet 8.6 mg (8.6 mg Oral Given 03/30/20 0123)  ondansetron (ZOFRAN) tablet 4 mg (has no administration in time range)    Or  ondansetron (ZOFRAN) injection 4 mg (has no administration in time range)  vancomycin (VANCOCIN) IVPB 1000 mg/200 mL premix (has no administration in time range)  ceFEPIme (MAXIPIME) 2 g in sodium chloride 0.9 % 100 mL IVPB (has no administration in time range)  oxyCODONE (Oxy IR/ROXICODONE) immediate release tablet 5 mg (has no administration in time range)  lactated ringers bolus 1,000 mL (0 mLs Intravenous Stopped 03/29/20 1708)    And  lactated ringers bolus 600 mL (0 mLs Intravenous Stopped 03/29/20 1639)  ceFEPIme (MAXIPIME) 2 g in sodium chloride 0.9 % 100 mL IVPB (0 g Intravenous Stopped 03/29/20 1631)  vancomycin (VANCOCIN) IVPB 1000 mg/200 mL premix (0 mg Intravenous Stopped 03/29/20 1834)  acetaminophen (TYLENOL) tablet 1,000 mg (1,000 mg Oral Given 03/29/20 1645)  vancomycin (VANCOREADY) IVPB 500 mg/100 mL  (0 mg Intravenous Stopped 03/29/20 1834)  sodium chloride 0.9 % bolus 500 mL (500 mLs Intravenous New Bag/Given 03/29/20 1941)     ED Discharge Orders    None       Note:  This document was prepared using Dragon voice recognition software and may include unintentional dictation errors.   Duffy Bruce, MD 03/30/20 0201

## 2020-03-29 NOTE — Progress Notes (Signed)
Notified bedside nurse of need to draw repeat lactic acid. 

## 2020-03-29 NOTE — ED Notes (Signed)
Pt given phone for MRI.

## 2020-03-29 NOTE — ED Notes (Signed)
Pt transported to MRI 

## 2020-03-29 NOTE — H&P (Addendum)
History and Physical    [name] HEN:[id number] DOB: [birth year] DOA: 03/29/2020  PCP: [name], MD   Patient coming from:  Home  I have personally briefly reviewed patient's old medical records in [place]  Chief Complaint:  HPI: [name] is a 64 y.o. female with medical history significant of metastatic breast cancer, diabetes mellitus, CKD stage III, CHF, presents in the emergency department with open draining left foot wound and associated fever.  Patient reports having this wound for a while,  she has recently established care with the wound clinic but it has recently gotten worse and started draining.  She reports seeing her primary care physician and was started on Keflex and then which was recently switched to Bactrim but she is not seeing any improvement.  She reports having fever,  associated chills and body ache.  she denies any shortness of breath,  cough. 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  ED Course: She was febrile with a fever of 101.9, heart rate 92, respiratory rate 18, blood pressure 134 / 75, O2 saturation 95% on room air. Labs: CMP: Sodium 133 potassium 4.8, chloride 100, bicarb 22, BUN 37, creatinine 2.74, glucose 381 , calcium 8.5, WBC 25.2 hemoglobin 8.6, hematocrit 26.1, platelet 359, LFTs slightly elevated, lactic acid 2.1. CXR: Stable nodular opacity in the lingula measuring 1.4 x 1.3 cm. Bilateral pleural effusions with bibasilar atelectasis.  Heart upper normal in size.  Port-A-Cath tip in superior vena cava.  Xray Left foot: Soft tissue swelling and subcutaneous air consistent with localized infection predominately about the fifth MTP joint.  No definitive erosive changes are noted. If clinically indicated MRI may be helpful for further evaluation.   Review of Systems: As per HPI otherwise 10 point review of systems negative.  Review of Systems   Constitutional: Positive for chills and fever.  HENT: Negative.   Eyes: Negative.   Respiratory: Negative.   Cardiovascular: Negative.   Gastrointestinal: Negative.   Genitourinary: Negative.   Musculoskeletal: Positive for joint pain and neck pain.  Skin: Positive for rash.  Neurological: Negative.   Endo/Heme/Allergies: Negative.   Psychiatric/Behavioral: Positive for depression. The patient has insomnia.     Past Medical History:  Diagnosis Date  . Anemia   . Anxiety   . Asthma   . Cancer ([place]) 03/10/2018   Per NM PET order. Carcinoma of upper-inner quadrant of left breast in female, estrogen receptor positive .  Marland Kitchen Cancer (HCC)    LUNG  . CHF (congestive heart failure) ([place]) 1997  . CKD (chronic kidney disease)   . Depression   . Diabetes mellitus, type 2 ([place])   . Family history of breast cancer   . Family history of colon cancer   . Family history of ovarian cancer   . Family history of pancreatic cancer   . Family history of prostate cancer   . Family history of stomach cancer   . GERD (gastroesophageal reflux disease)    history of an ulcer  . Hair loss   . History of left breast cancer 05/29/14  . History of partial hysterectomy 12/31/2016   Per patient.  Has not had a period in years.  Had a partial hysterectomy years ago.  Marland Kitchen Hypertension   . Mitral valve regurgitation   . Neuromuscular disorder (HCC)    neuropathies in hand  . Obesity   . Pancreatitis 1997  . Stroke [facility]) 2010  with mild left arm weakness    Past Surgical History:  Procedure Laterality Date  . AMPUTATION Left 03/30/2020   Procedure: AMPUTATION 5th RAY;  Surgeon: Samara Deist, DPM;  Location: [facility] ORS;  Service: Podiatry;  Laterality: Left;  . APPLICATION OF WOUND VAC Left 03/30/2020   Procedure: APPLICATION OF WOUND VAC;  Surgeon: Samara Deist, DPM;  Location: [facility] ORS;  Service: Podiatry;  Laterality: Left;  . CATARACT EXTRACTION W/PHACO Right 02/24/2019   Procedure: CATARACT  EXTRACTION PHACO AND INTRAOCULAR LENS PLACEMENT ([place]) RIGHT DIABETES;  Surgeon: Marchia Meiers, MD;  Location: [facility] ORS;  Service: Ophthalmology;  Laterality: Right;  [place] 01:13.0 CDE 7.96 Fluid Pack Lot # [id number]  . CATARACT EXTRACTION W/PHACO Left 03/24/2019   Procedure: CATARACT EXTRACTION PHACO AND INTRAOCULAR LENS PLACEMENT ([facility]) - left diabetic;  Surgeon: Marchia Meiers, MD;  Location: [facility] ORS;  Service: Ophthalmology;  Laterality: Left;  [place]  01:36 CDE 13.93 Fluid pack lot # [id number]  . CESAREAN SECTION    . CHOLECYSTECTOMY    . EXCISION OF TONGUE LESION N/A 08/17/2018   Procedure: EXCISION OF TONGUE LESION WITH FROZEN SECTIONS;  Surgeon: Beverly Gust, MD;  Location: [facility] ORS;  Service: ENT;  Laterality: N/A;  . EYE SURGERY Right    cataract extraction  . IRRIGATION AND DEBRIDEMENT FOOT Left 03/30/2020   Procedure: IRRIGATION AND DEBRIDEMENT FOOT;  Surgeon: Samara Deist, DPM;  Location: [facility] ORS;  Service: Podiatry;  Laterality: Left;  . PARTIAL HYSTERECTOMY  12/31/2016   Per patient, she has not had a period in years since she had a partial hysterectomy.  Marland Kitchen PORTA CATH INSERTION    . TUBAL LIGATION       reports that she has quit smoking. Her smoking use included cigarettes. She has a 0.50 pack-year smoking history. She has never used smokeless tobacco. She reports that she does not drink alcohol and does not use drugs.  Allergies  Allergen Reactions  . Chlorhexidine   . Fish-Derived Products   . Sulfamethoxazole-Trimethoprim Other (See Comments)    Family History  Problem Relation Age of Onset  . Ovarian cancer Mother 51  . Diabetes Mother   . Hypertension Mother   . COPD Father   . Hypertension Father   . Colon cancer Father 77  . Diabetes Sister   . Breast cancer Sister 90       bilateral  . Diabetes Brother   . Leukemia Maternal Aunt   . Pancreatic cancer Paternal Aunt 21  . Pancreatic cancer Paternal Uncle   . Colon cancer Paternal Uncle   . Stomach cancer  Maternal Grandfather 80  . Throat cancer Paternal Grandmother   . Breast cancer Maternal Aunt 80  . Colon cancer Maternal Aunt   . Bone cancer Maternal Aunt   . Breast cancer Paternal Aunt        dx >50  . Prostate cancer Paternal Uncle   . Pancreatic cancer Paternal Uncle   . Throat cancer Paternal Uncle   . Lung cancer Paternal Uncle   . Stomach cancer Paternal Uncle   . Brain cancer Paternal Aunt   . Cancer Cousin        liver, kidney  . Prostate cancer Cousin        meastatic  . Lung cancer Other     Family history reviewed and not pertinent   Prior to Admission medications   Medication Sig Start Date End Date Taking? Authorizing Provider  acetaminophen-codeine (TYLENOL #4) 300-60 MG tablet Take 1 tablet  by mouth 2 (two) times daily as needed for moderate pain.  Patient not taking: Reported on 03/01/2020    [provider]  albuterol (PROAIR HFA) 108 (90 BASE) MCG/ACT inhaler Inhale 2 puffs into the lungs every 6 (six) hours as needed for wheezing or shortness of breath.  06/20/14   [provider]  albuterol (PROVENTIL) (2.5 MG/3ML) 0.083% nebulizer solution Inhale 2.5 mg into the lungs every 6 (six) hours as needed for shortness of breath.  Patient not taking: Reported on 03/01/2020 07/17/14   [provider]  ALPRAZolam Duanne Moron) 0.5 MG tablet Take 0.5 mg by mouth 2 (two) times daily as needed for anxiety or sleep.     [provider]  amLODipine (NORVASC) 10 MG tablet Take 10 mg by mouth daily.  06/21/14   [provider]  aspirin EC 81 MG tablet Take 81 mg by mouth daily.  06/21/14   [provider]  atenolol (TENORMIN) 100 MG tablet Take 100 mg by mouth 2 (two) times daily.  04/07/14   [provider]  bumetanide (BUMEX) 0.5 MG tablet Take 0.5 mg by mouth 2 (two) times daily.  08/22/14   [provider]  calcitRIOL (ROCALTROL) 0.25 MCG capsule Take 0.25 mcg by mouth daily.  09/21/16   [provider]   Cinnamon 500 MG capsule Take 500 mg by mouth 2 (two) times daily.     [provider]  cloNIDine (CATAPRES) 0.2 MG tablet Take 0.2 mg by mouth 2 (two) times daily.  06/21/14   [provider]  diflorasone (PSORCON) 0.05 % ointment Apply 1 application topically as directed.  Patient not taking: Reported on 03/01/2020    [provider]  ELDERBERRY PO Take 1 tablet by mouth daily. Patient not taking: Reported on 03/01/2020    [provider]  enalapril (VASOTEC) 10 MG tablet Take 20 mg by mouth 2 (two) times a day.  01/12/19   [provider]  famotidine (PEPCID) 20 MG tablet Take 20 mg by mouth 2 (two) times daily.  09/14/19   [provider]  ferrous sulfate 325 (65 FE) MG tablet Take 325 mg by mouth 2 (two) times daily with a meal. Patient not taking: Reported on 03/01/2020    [provider]  FLUoxetine (PROZAC) 20 MG capsule Take 20 mg by mouth 2 (two) times daily.  06/21/14   [provider]  Fluticasone Propionate, Inhal, (FLOVENT DISKUS) 100 MCG/BLIST AEPB Inhale 2 puffs into the lungs 2 (two) times daily as needed (respiratory problems).  Patient not taking: Reported on 03/01/2020    [provider]  gabapentin (NEURONTIN) 100 MG capsule TAKE 1 CAPSULE(100 MG) BY MOUTH AT BEDTIME 11/04/19   Cammie Sickle, MD  glyBURIDE (DIABETA) 5 MG tablet Take 10 mg by mouth 2 (two) times daily with a meal.  06/20/14   [provider]  LEVEMIR FLEXTOUCH 100 UNIT/ML Pen Inject 55 Units into the skin daily.  05/09/15   [provider]  loperamide (IMODIUM A-D) 2 MG capsule Take 2-4 mg by mouth 4 (four) times daily as needed for diarrhea or loose stools.  Patient not taking: Reported on 03/01/2020    [provider]  NOVOLOG FLEXPEN 100 UNIT/ML FlexPen Inject 7 Units into the skin 2 (two) times daily.  11/09/15   [provider]  oxyCODONE-acetaminophen (PERCOCET/ROXICET) 5-325 MG tablet Take 1  tablet by mouth daily as needed for severe pain. Patient not taking: Reported on 03/01/2020  [provider]  simvastatin (ZOCOR) 20 MG tablet Take 20 mg by mouth every evening.     [provider]  vitamin B-12 (CYANOCOBALAMIN) 1000 MCG tablet Take 1,000 mcg by mouth daily.    [provider]    Physical Exam: Vitals:   04/04/20 1038 04/04/20 1039 04/04/20 1129 04/04/20 1417  BP: (!) 160/95 (!) 160/95 (!) 153/97 (!) 183/105  Pulse: 75 75 74 77  Resp:   16 20  Temp:   97.8 F (36.6 C) 98.4 F (36.9 C)  TempSrc:   Oral Oral  SpO2: 100% 100%  97%  Weight:    88.5 kg  Height:    5\' 2"  (1.575 m)    Constitutional: NAD, calm, comfortable Vitals:   04/04/20 1038 04/04/20 1039 04/04/20 1129 04/04/20 1417  BP: (!) 160/95 (!) 160/95 (!) 153/97 (!) 183/105  Pulse: 75 75 74 77  Resp:   16 20  Temp:   97.8 F (36.6 C) 98.4 F (36.9 C)  TempSrc:   Oral Oral  SpO2: 100% 100%  97%  Weight:    88.5 kg  Height:    5\' 2"  (1.575 m)   Eyes: PERRL, lids and conjunctivae normal ENMT: Mucous membranes are moist. Posterior pharynx clear of any exudate or lesions.Normal dentition.  Neck: normal, supple, no masses, no thyromegaly Respiratory: clear to auscultation bilaterally, no wheezing, no crackles. Normal respiratory effort. No accessory muscle use.  Chest : porta cath noted Cardiovascular: Regular rate and rhythm, no murmurs / rubs / gallops. No extremity edema. 2+ pedal pulses. No carotid bruits.  Abdomen: no tenderness, no masses palpated. No hepatosplenomegaly. Bowel sounds positive.  Musculoskeletal: no clubbing / cyanosis. Left foot wound noted, draining , associated redness. Skin: no rashes, lesions, ulcers. No induration Neurologic: CN 2-12 grossly intact. Sensation intact, DTR normal. Strength 5/5 in all 4.  Psychiatric: Normal judgment and insight. Alert and oriented x 3. Normal mood.     Labs on Admission: I have personally reviewed following labs and  imaging studies  CBC: Recent Labs  Lab 03/29/20 1127 03/30/20 0453 03/31/20 0530 04/01/20 0705 04/02/20 0710 04/03/20 0821 04/04/20 0505  WBC 25.2*   < > 19.6* 16.4* 11.5* 12.4* 13.9*  NEUTROABS 23.2*  --   --   --   --   --   --   HGB 8.6*   < > 7.5* 7.4* 8.1* 8.8* 8.4*  HCT 26.1*   < > 21.9* 23.0* 24.5* 26.9* 25.4*  MCV 91.3   < > 89.0 92.4 91.1 91.5 91.0  PLT 359   < > 346 433* 450* 504* 483*   < > = values in this interval not displayed.   Basic Metabolic Panel: Recent Labs  Lab 03/31/20 0530 04/01/20 0705 04/02/20 0710 04/03/20 0821 04/04/20 0505  NA 135 137 140 137 139  K 5.1 4.4 4.6 4.7 4.0  CL 106 109 112* 109 110  CO2 24 21* 21* 21* 21*  GLUCOSE 347* 96 140* 217* 82  BUN 39* 38* 33* 30* 27*  CREATININE 2.43* 2.39* 2.10* 1.92* 1.90*  CALCIUM 8.0* 8.4* 8.3* 8.6* 8.7*  MG 2.1  --  2.2 2.1 2.1  PHOS 3.9  --  2.6 2.3* 2.9   GFR: Estimated Creatinine Clearance: 31.3 mL/min (A) (by C-G formula based on SCr of 1.9 mg/dL (H)). Liver Function Tests: Recent Labs  Lab 03/29/20 1127 03/30/20 0453  AST 78* 30  ALT 55* 38  ALKPHOS 94 77  BILITOT 0.8 1.0  PROT 7.7 6.9  ALBUMIN 3.0* 2.6*   No results for input(s): LIPASE, AMYLASE in the last 168 hours. No results for input(s): AMMONIA in the last 168 hours. Coagulation Profile: Recent Labs  Lab 03/30/20 0453  INR 1.3*   Cardiac Enzymes: No results for input(s): CKTOTAL, CKMB, CKMBINDEX, TROPONINI in the last 168 hours. BNP (last 3 results) No results for input(s): PROBNP in the last 8760 hours. HbA1C: Recent Labs    04/04/20 0505  HGBA1C 8.0*   CBG: Recent Labs  Lab 04/03/20 1206 04/03/20 1607 04/03/20 2140 04/04/20 0749 04/04/20 1132  GLUCAP 267* 181* 145* 91 109*   Lipid Profile: No results for input(s): CHOL, HDL, LDLCALC, TRIG, CHOLHDL, LDLDIRECT in the last 72 hours. Thyroid Function Tests: No results for input(s): TSH, T4TOTAL, FREET4, T3FREE, THYROIDAB in the last 72 hours. Anemia  Panel: No results for input(s): VITAMINB12, FOLATE, FERRITIN, TIBC, IRON, RETICCTPCT in the last 72 hours. Urine analysis:    Component Value Date/Time   COLORURINE YELLOW (A) 03/01/2020 1850   APPEARANCEUR HAZY (A) 03/01/2020 1850   LABSPEC 1.009 03/01/2020 1850   PHURINE 5.0 03/01/2020 1850   GLUCOSEU >=500 (A) 03/01/2020 1850   HGBUR SMALL (A) 03/01/2020 1850   BILIRUBINUR NEGATIVE 03/01/2020 [place] NEGATIVE 03/01/2020 1850   PROTEINUR 100 (A) 03/01/2020 1850   NITRITE NEGATIVE 03/01/2020 1850   LEUKOCYTESUR LARGE (A) 03/01/2020 1850    Radiological Exams on Admission: DG Foot Complete Left  Result Date: 04/03/2020 CLINICAL DATA:  Fifth ray amputation EXAM: LEFT FOOT - COMPLETE 3+ VIEW COMPARISON:  None. FINDINGS: Interval postsurgical changes from transmetatarsal amputation of the fifth ray at the level of the proximal diaphysis. Resection margin is smooth. Expected postoperative changes within the distal soft tissues at the amputation site. No acute fracture. No malalignment. No new areas of cortical irregularity. IMPRESSION: Interval postsurgical changes from transmetatarsal amputation of the fifth ray at the level of the proximal diaphysis. Electronically Signed   By: Davina Poke D.O.   On: 04/03/2020 16:23    EKG: Independently reviewed.  Normal sinus rhythm.  Assessment/Plan Principal Problem:   Sepsis ([place]) Active Problems:   Asthma   Benign essential HTN   Chronic kidney disease (CKD), stage V (HCC)   Chronic systolic CHF (congestive heart failure) (HCC)   Depression with anxiety   Type 2 diabetes mellitus with diabetic chronic kidney disease (HCC)   MI (mitral incompetence)   Carcinoma of upper-inner quadrant of left breast in female, estrogen receptor positive ([place])   Metastasis from malignant tumor of breast (HCC)   Anemia of chronic disease   Lactic acidosis   Sepsis secondary to infected Left foot wound/ Lactic acidosis: POA: temp 101.9  HR  92 RR 18 BP 134/75 lactic acid 2.1.  Leukocytosis 25.5  Source foot infection. Start sepsis order set. Lactic acidosis : She received IV fluids in the ED, Lactic acid normalized. Given Vanco, cefepime and Flagyl in the ED. We will continue vancomycin and cefepime,  pharmacy consulted. Adequate pain control with oxycodone as needed. Continue gentle hydration. Podiatry consulted, will follow up recommendation.   She might need debridement or amputation we will keep patient n.p.o. overnight.  AKI on CKD stage III: Baseline creatinine remains around 2.1.   Avoid nephrotoxic medications, continue gentle hydration,  recheck labs tomorrow morning.  Anemia of chronic disease: No obvious bleeding, hemoglobin is stable.  Hypertension:  Continue home medications once medical reconciliation complete.  Diabetes mellitus Continue Lantus 55 units at  night. Sliding scale,  obtain hemoglobin A1c.  Elevated LFTs:  Could be due to metastatic lesion.  Metastatic breast cancer; She follows up outpatient with oncologist, consider up oncology consult.  Depression with anxiety: Resume home meds   DVT prophylaxis:  Lovenox Code Status: Full Family Communication: Discussed with patient,  Nobody at bedside. Disposition Plan: Anticipated discharge home once medically clear. Consults called: Podiatrist Admission status: Inpatient.   Shawna Clamp MD [facility]   If 7PM-7AM, please contact night-coverage [email or web address]   04/04/2020, 2:30 PM

## 2020-03-29 NOTE — ED Notes (Signed)
MD Shawna Clamp at bedside.

## 2020-03-29 NOTE — Progress Notes (Signed)
Pharmacy Antibiotic Note  [name] is a 64 y.o. female admitted on 03/29/2020 with sepsis.  Pharmacy has been consulted for Cefepime and vancomycin dosing. Patient has PMH of CKD stage V, DM,  CHF, and metastatic breast cancer. Tmax: 101.9, WBC: 25.2   Patient received Vancomycin 1500mg  IV and Cefepime 2g IV in ED.   Plan: Will start vancomycin 1g IV every 24 hours based on  Vancomycin Nomogram.   Start Cefepime 2g IV every 24 hours   Height: 5\' 2"  (157.5 cm) Weight: 88 kg (194 lb) IBW/kg (Calculated) : 50.1  Temp (24hrs), Avg:100.2 F (37.9 C), Min:98.4 F (36.9 C), Max:101.9 F (38.8 C)  Recent Labs  Lab 03/29/20 1127 03/29/20 1551  WBC 25.2*  --   CREATININE 2.74*  --   LATICACIDVEN 2.1* 1.4    Estimated Creatinine Clearance: 21.7 mL/min (A) (by C-G formula based on SCr of 2.74 mg/dL (H)).    Allergies  Allergen Reactions  . Fish-Derived Products   . Sulfamethoxazole-Trimethoprim Other (See Comments)    Antimicrobials this admission: 8/26 Cefepime >>  8/26 Vancomycin  >>   Microbiology results: 8/26 BCx: pending  Thank you for allowing pharmacy to be a part of this patient's care.  Pernell Dupre, PharmD, BCPS Clinical Pharmacist 03/29/2020 5:33 PM

## 2020-03-29 NOTE — ED Notes (Signed)
Podiatrist at bedside at this time.

## 2020-03-29 NOTE — ED Triage Notes (Signed)
Pt states she woke up in the middle of the night feeling feverish and chills- pt states this morning she started having SHOB and dizziness- pt was placed on an antibiotic for an abscess and is worried that the medication is making her feel bad- pt has metastatic breast cancer

## 2020-03-29 NOTE — ED Notes (Signed)
Drainage/ulcer  noted on left foot. VO per MD Issacs to place nonstick depressing. Dressing placed by this RN.

## 2020-03-29 NOTE — Consult Note (Signed)
ORTHOPAEDIC CONSULTATION  REQUESTING PHYSICIAN: Shawna Clamp, MD  Chief Complaint: Left foot infection and wound  HPI: [name] is a 64 y.o. female who complains of longstanding wound to her left foot.  She has been seen at the outpatient wound care center.  Came to the ER with worsening infection to her left foot.  She has a longstanding history of diabetes with neuropathy.  Past Medical History:  Diagnosis Date  . Anemia   . Anxiety   . Asthma   . Cancer ([place]) 03/10/2018   Per NM PET order. Carcinoma of upper-inner quadrant of left breast in female, estrogen receptor positive .  Marland Kitchen Cancer (HCC)    LUNG  . CHF (congestive heart failure) ([place]) 1997  . CKD (chronic kidney disease)   . Depression   . Diabetes mellitus, type 2 ([place])   . Family history of breast cancer   . Family history of colon cancer   . Family history of ovarian cancer   . Family history of pancreatic cancer   . Family history of prostate cancer   . Family history of stomach cancer   . GERD (gastroesophageal reflux disease)    history of an ulcer  . Hair loss   . History of left breast cancer 05/29/14  . History of partial hysterectomy 12/31/2016   Per patient.  Has not had a period in years.  Had a partial hysterectomy years ago.  Marland Kitchen Hypertension   . Mitral valve regurgitation   . Neuromuscular disorder (HCC)    neuropathies in hand  . Obesity   . Pancreatitis 1997  . Stroke [facility]) 2010   with mild left arm weakness   Past Surgical History:  Procedure Laterality Date  . CATARACT EXTRACTION W/PHACO Right 02/24/2019   Procedure: CATARACT EXTRACTION PHACO AND INTRAOCULAR LENS PLACEMENT ([place]) RIGHT DIABETES;  Surgeon: Marchia Meiers, MD;  Location: [facility] ORS;  Service: Ophthalmology;  Laterality: Right;  [place] 01:13.0 CDE 7.96 Fluid Pack Lot # [id number]  . CATARACT EXTRACTION W/PHACO Left 03/24/2019   Procedure: CATARACT EXTRACTION PHACO AND INTRAOCULAR LENS PLACEMENT ([facility]) - left diabetic;  Surgeon:  Marchia Meiers, MD;  Location: [facility] ORS;  Service: Ophthalmology;  Laterality: Left;  [place]  01:36 CDE 13.93 Fluid pack lot # [id number]  . CESAREAN SECTION    . CHOLECYSTECTOMY    . EXCISION OF TONGUE LESION N/A 08/17/2018   Procedure: EXCISION OF TONGUE LESION WITH FROZEN SECTIONS;  Surgeon: Beverly Gust, MD;  Location: [facility] ORS;  Service: ENT;  Laterality: N/A;  . EYE SURGERY Right    cataract extraction  . PARTIAL HYSTERECTOMY  12/31/2016   Per patient, she has not had a period in years since she had a partial hysterectomy.  Marland Kitchen PORTA CATH INSERTION    . TUBAL LIGATION     Social History   Socioeconomic History  . Marital status: Single    Spouse name: S.O.. .... keith  . Number of children: Not on file  . Years of education: Not on file  . Highest education level: Not on file  Occupational History    Comment: disabled  Tobacco Use  . Smoking status: Former Smoker    Packs/day: 0.50    Years: 1.00    Pack years: 0.50    Types: Cigarettes  . Smokeless tobacco: Never Used  Vaping Use  . Vaping Use: Never used  Substance and Sexual Activity  . Alcohol use: No    Alcohol/week: 0.0 standard drinks  . Drug use: No  .  Sexual activity: Not on file    Comment: quit 8 years ago  Other Topics Concern  . Not on file  Social History Narrative  . Not on file   Social Determinants of Health   Financial Resource Strain:   . Difficulty of Paying Living Expenses: Not on file  Food Insecurity:   . Worried About Charity fundraiser in the Last Year: Not on file  . Ran Out of Food in the Last Year: Not on file  Transportation Needs:   . Lack of Transportation (Medical): Not on file  . Lack of Transportation (Non-Medical): Not on file  Physical Activity:   . Days of Exercise per Week: Not on file  . Minutes of Exercise per Session: Not on file  Stress:   . Feeling of Stress : Not on file  Social Connections:   . Frequency of Communication with Friends and Family: Not on file  .  Frequency of Social Gatherings with Friends and Family: Not on file  . Attends Religious Services: Not on file  . Active Member of Clubs or Organizations: Not on file  . Attends Archivist Meetings: Not on file  . Marital Status: Not on file   Family History  Problem Relation Age of Onset  . Ovarian cancer Mother 4  . Diabetes Mother   . Hypertension Mother   . COPD Father   . Hypertension Father   . Colon cancer Father 30  . Diabetes Sister   . Breast cancer Sister 63       bilateral  . Diabetes Brother   . Leukemia Maternal Aunt   . Pancreatic cancer Paternal Aunt 2  . Pancreatic cancer Paternal Uncle   . Colon cancer Paternal Uncle   . Stomach cancer Maternal Grandfather 24  . Throat cancer Paternal Grandmother   . Breast cancer Maternal Aunt 80  . Colon cancer Maternal Aunt   . Bone cancer Maternal Aunt   . Breast cancer Paternal Aunt        dx >50  . Prostate cancer Paternal Uncle   . Pancreatic cancer Paternal Uncle   . Throat cancer Paternal Uncle   . Lung cancer Paternal Uncle   . Stomach cancer Paternal Uncle   . Brain cancer Paternal Aunt   . Cancer Cousin        liver, kidney  . Prostate cancer Cousin        meastatic  . Lung cancer Other    Allergies  Allergen Reactions  . Fish-Derived Products   . Sulfamethoxazole-Trimethoprim Other (See Comments)   Prior to Admission medications   Medication Sig Start Date End Date Taking? Authorizing Provider  albuterol (PROAIR HFA) 108 (90 BASE) MCG/ACT inhaler Inhale 2 puffs into the lungs every 6 (six) hours as needed for wheezing or shortness of breath.  06/20/14  Yes [provider]  amLODipine (NORVASC) 10 MG tablet Take 10 mg by mouth daily.  06/21/14  Yes [provider]  aspirin EC 81 MG tablet Take 81 mg by mouth daily.  06/21/14  Yes [provider]  atenolol (TENORMIN) 100 MG tablet Take 100 mg by mouth 2 (two) times daily.  04/07/14  Yes [provider]   bumetanide (BUMEX) 0.5 MG tablet Take 0.5 mg by mouth 2 (two) times daily.  08/22/14  Yes [provider]  calcitRIOL (ROCALTROL) 0.25 MCG capsule Take 0.25 mcg by mouth daily.  09/21/16  Yes [provider]  Cinnamon 500 MG capsule  Take 500 mg by mouth 2 (two) times daily.    Yes [provider]  cloNIDine (CATAPRES) 0.2 MG tablet Take 0.2 mg by mouth 2 (two) times daily.  06/21/14  Yes [provider]  doxycycline (VIBRAMYCIN) 100 MG capsule TAKE 1 CAPSULE BY MOUTH TWICE DAILY FOR 14 DAYS 03/26/20 04/09/20 Yes [provider]  enalapril (VASOTEC) 10 MG tablet Take 20 mg by mouth 2 (two) times a day.  01/12/19  Yes [provider]  famotidine (PEPCID) 20 MG tablet Take 20 mg by mouth 2 (two) times daily.  09/14/19  Yes [provider]  FLUoxetine (PROZAC) 20 MG capsule Take 20 mg by mouth 2 (two) times daily.  06/21/14  Yes [provider]  gabapentin (NEURONTIN) 100 MG capsule TAKE 1 CAPSULE(100 MG) BY MOUTH AT BEDTIME 11/04/19  Yes Cammie Sickle, MD  glyBURIDE (DIABETA) 5 MG tablet Take 10 mg by mouth 2 (two) times daily with a meal.  06/20/14  Yes [provider]  LEVEMIR FLEXTOUCH 100 UNIT/ML Pen Inject 55 Units into the skin daily.  05/09/15  Yes [provider]  loperamide (IMODIUM A-D) 2 MG capsule Take 2-4 mg by mouth 4 (four) times daily as needed for diarrhea or loose stools.    Yes [provider]  NOVOLOG FLEXPEN 100 UNIT/ML FlexPen Inject 7 Units into the skin 2 (two) times daily.  11/09/15  Yes [provider]  simvastatin (ZOCOR) 20 MG tablet Take 20 mg by mouth every evening.    Yes [provider]  acetaminophen-codeine (TYLENOL #4) 300-60 MG tablet Take 1 tablet by mouth 2 (two) times daily as needed for moderate pain.  Patient not taking: Reported on 03/01/2020    [provider]  ALPRAZolam Duanne Moron) 0.5 MG tablet Take 0.5 mg by mouth 2 (two) times daily as  needed for anxiety or sleep.     [provider]  vitamin B-12 (CYANOCOBALAMIN) 1000 MCG tablet Take 1,000 mcg by mouth daily.    [provider]   DG Chest 2 View  Result Date: 03/29/2020 CLINICAL DATA:  Shortness of breath and dizziness.  Breast carcinoma EXAM: CHEST - 2 VIEW COMPARISON:  Chest radiograph September 27, 2019; PET-CT March 28, 2020 FINDINGS: There are pleural effusions bilaterally with bibasilar atelectatic change. The masslike area in the lingula seen on recent PET study is appreciable by radiography measuring 1.4 x 1.3 cm. Lungs elsewhere are clear. Heart is upper normal in size with pulmonary vascularity normal. Port-A-Cath tip is in the superior vena cava. No adenopathy is evident by radiography. IMPRESSION: Stable nodular opacity in the lingula measuring 1.4 x 1.3 cm. Bilateral pleural effusions with bibasilar atelectasis. No new opacity evident compared to recent PET study. Heart upper normal in size.  Port-A-Cath tip in superior vena cava. Electronically Signed   By: Lowella Grip III M.D.   On: 03/29/2020 12:33   NM PET Image Restag (PS) Skull Base To Thigh  Result Date: 03/28/2020 CLINICAL DATA:  Subsequent treatment strategy for breast cancer. EXAM: NUCLEAR MEDICINE PET SKULL BASE TO THIGH TECHNIQUE: 10.2 mCi F-18 FDG was injected intravenously. Full-ring PET imaging was performed from the skull base to thigh after the radiotracer. CT data was obtained and used for attenuation correction and anatomic localization. Fasting blood glucose: 47 mg/dl COMPARISON:  12/12/2019. FINDINGS: Mediastinal blood pool activity: SUV max 1.9 Liver activity: SUV max NA NECK: No abnormal hypermetabolism. Incidental CT findings: None. CHEST: New and pre-existing hypermetabolic pleural/subpleural nodules are  seen bilaterally. Index 1.4 x 1.8 cm juxta mediastinal subpleural right upper lobe nodule has an SUV max of 7.1, compared to 3.2 previously. 10 mm subcarinal lymph node has an  SUV max of 3.8. No hypermetabolic axillary adenopathy. Lateral left breast mass measures similar, 1.8 x 3.3 cm with an SUV max 4.4, compared to 3.4 previously. Incidental CT findings: Right IJ Port-A-Cath terminates in the SVC. Atherosclerotic calcification of the aorta. Heart is enlarged. No pericardial effusion. Moderate right pleural effusion and small left pleural effusion, increased, with associated pleural thickening. ABDOMEN/PELVIS: New hypermetabolism in both adrenal glands, with an index SUV mass on the right of 4.4. The CT appearance of the adrenal glands is unchanged from 12/12/2019, however. No abnormal hypermetabolism in the liver, spleen or pancreas. Left external iliac lymph nodes are mildly hypermetabolic. Index 7 mm left external iliac lymph node (3/217) has increased slightly in size from 4 mm on 12/12/2019 and has an SUV max of 2.8. Uptake associated with soft tissue thickening along the right paramidline ventral abdominal wall may be postoperative in etiology. Incidental CT findings: Liver is unremarkable. Cholecystectomy. Adrenal glands are otherwise unremarkable. 1.4 cm low-attenuation lesion off the right kidney is likely a cyst although definitive characterization is limited postcontrast imaging. Kidneys, spleen, pancreas, stomach and bowel are otherwise grossly unremarkable. SKELETON: Treated sclerotic lesions are seen. For example, an index lesion in the right acetabulum does not show abnormal hypermetabolism. There are new foci of hypermetabolism within the spine. Index 5 mm lytic lesion in the T10 vertebral body (3/117) has an SUV max of 4.8. Incidental CT findings: Degenerative changes in the spine. IMPRESSION: 1. Progressive pleuroparenchymal and osseous metastatic disease. 2. New hypermetabolic lymph nodes in the subcarinal station and left external iliac chain. 3. Similar hypermetabolic primary left breast mass. 4. New hypermetabolism within the adrenal glands, without definite CT  correlate. Continued attention on follow-up exams is warranted. 5. Moderate right and small left pleural effusions, increased, with associated pleural thickening. 6.  Aortic atherosclerosis (ICD10-I70.0). Electronically Signed   By: Lorin Picket M.D.   On: 03/28/2020 10:51   DG Foot Complete Left  Result Date: 03/29/2020 CLINICAL DATA:  Left foot wound, initial encounter EXAM: LEFT FOOT - COMPLETE 3+ VIEW COMPARISON:  None. FINDINGS: Considerable soft tissue swelling is noted in the distal aspect of the foot. Some subcutaneous air is noted about the fifth MTP joint. These changes correspond to the given clinical history of localized infection. No definitive fracture or bony erosive changes are noted. No other focal abnormality is seen. IMPRESSION: Soft tissue swelling and subcutaneous air consistent with localized infection predominately about the fifth MTP joint. No definitive erosive changes are noted. If clinically indicated MRI may be helpful for further evaluation. Electronically Signed   By: Inez Catalina M.D.   On: 03/29/2020 15:56    Positive ROS: All other systems have been reviewed and were otherwise negative with the exception of those mentioned in the HPI and as above.  12 point ROS was performed.  Physical Exam: General: Alert and oriented.  No apparent distress.  Vascular:  Left foot:Dorsalis Pedis:  present Posterior Tibial:  diminished  Right foot: Dorsalis Pedis:  present Posterior Tibial:  diminished  Neuro:absent  Derm: No ulceration on the right foot.  Areas of healed ulcer are noted.  On the left foot there is a necrotic ulceration under the fifth MTPJ.  There is some superficial erythema to the dorsal left foot.  With palpation there is fluctuance to  the fifth MTPJ ulcerative site.  Ortho/MS: Noted diffuse edema to bilateral lower extremities with left being worse than the right.  Assessment: Gas producing inspection left fifth MTPJ Necrotic diabetic ulcer left  foot Diabetic neuropathy  Plan: She has small areas of gas on x-ray of this fifth MTPJ ulceration.  At this point we will order an MRI for further evaluation.  We will plan on I&D with debridement and likely partial fifth ray amputation tomorrow in the OR.  I discussed this with the patient and consent has been given.  Orders for surgery were placed by myself.  Discussed the risks and benefits with surgery with the patient and consent was given verbally today.    Elesa Hacker, DPM Cell [phone]   03/29/2020 6:00 PM

## 2020-03-29 NOTE — Progress Notes (Signed)
CODE SEPSIS - PHARMACY COMMUNICATION  **Broad Spectrum Antibiotics should be administered within 1 hour of Sepsis diagnosis**  Time Code Sepsis Called/Page Received: 1522  Antibiotics Ordered: Vancomycin, cefepime, Flagyl   Time of 1st antibiotic administration: @ [place], PharmD, BCPS Clinical Pharmacist 03/29/2020 4:03 PM

## 2020-03-30 ENCOUNTER — Inpatient Hospital Stay: Payer: Medicare HMO | Admitting: Anesthesiology

## 2020-03-30 ENCOUNTER — Inpatient Hospital Stay
Admit: 2020-03-30 | Discharge: 2020-03-30 | Disposition: A | Discharge status: Home / Self Care | Payer: Medicare HMO | Attending: Family Medicine | Admitting: Family Medicine

## 2020-03-30 ENCOUNTER — Inpatient Hospital Stay: Payer: Medicare HMO

## 2020-03-30 ENCOUNTER — Inpatient Hospital Stay: Payer: Medicare HMO | Admitting: Internal Medicine

## 2020-03-30 ENCOUNTER — Encounter
Admission: EM | Disposition: A | Discharge status: Skilled Nursing Facility | Payer: Self-pay | Source: Home / Self Care | Attending: Family Medicine

## 2020-03-30 HISTORY — PX: APPLICATION OF WOUND VAC: SHX5189

## 2020-03-30 HISTORY — PX: AMPUTATION: SHX166

## 2020-03-30 HISTORY — PX: IRRIGATION AND DEBRIDEMENT FOOT: SHX6602

## 2020-03-30 LAB — CBC
HCT: 22 % — ABNORMAL LOW (ref 36.0–46.0)
Hemoglobin: 7.3 g/dL — ABNORMAL LOW (ref 12.0–15.0)
MCH: 30.2 pg (ref 26.0–34.0)
MCHC: 33.2 g/dL (ref 30.0–36.0)
MCV: 90.9 fL (ref 80.0–100.0)
Platelets: 346 10*3/uL (ref 150–400)
RBC: 2.42 MIL/uL — ABNORMAL LOW (ref 3.87–5.11)
RDW: 14.6 % (ref 11.5–15.5)
WBC: 21.2 10*3/uL — ABNORMAL HIGH (ref 4.0–10.5)
nRBC: 0 % (ref 0.0–0.2)

## 2020-03-30 LAB — ECHOCARDIOGRAM COMPLETE
AR max vel: 1.67 cm2
AV Area VTI: 1.74 cm2
AV Area mean vel: 1.59 cm2
AV Mean grad: 6 mmHg
AV Peak grad: 10.4 mmHg
Ao pk vel: 1.61 m/s
Area-P 1/2: 5.66 cm2
Height: 62 in
S' Lateral: 2.79 cm
Weight: 3104 oz

## 2020-03-30 LAB — COMPREHENSIVE METABOLIC PANEL
ALT: 38 U/L (ref 0–44)
AST: 30 U/L (ref 15–41)
Albumin: 2.6 g/dL — ABNORMAL LOW (ref 3.5–5.0)
Alkaline Phosphatase: 77 U/L (ref 38–126)
Anion gap: 9 (ref 5–15)
BUN: 36 mg/dL — ABNORMAL HIGH (ref 8–23)
CO2: 23 mmol/L (ref 22–32)
Calcium: 8.5 mg/dL — ABNORMAL LOW (ref 8.9–10.3)
Chloride: 105 mmol/L (ref 98–111)
Creatinine, Ser: 2.53 mg/dL — ABNORMAL HIGH (ref 0.44–1.00)
GFR calc Af Amer: 23 mL/min — ABNORMAL LOW (ref >60)
GFR calc non Af Amer: 20 mL/min — ABNORMAL LOW (ref >60)
Glucose, Bld: 160 mg/dL — ABNORMAL HIGH (ref 70–99)
Potassium: 4.5 mmol/L (ref 3.5–5.1)
Sodium: 137 mmol/L (ref 135–145)
Total Bilirubin: 1 mg/dL (ref 0.3–1.2)
Total Protein: 6.9 g/dL (ref 6.5–8.1)

## 2020-03-30 LAB — GLUCOSE, CAPILLARY
Glucose-Capillary: 130 mg/dL — ABNORMAL HIGH (ref 70–99)
Glucose-Capillary: 121 mg/dL — ABNORMAL HIGH (ref 70–99)
Glucose-Capillary: 120 mg/dL — ABNORMAL HIGH (ref 70–99)
Glucose-Capillary: 230 mg/dL — ABNORMAL HIGH (ref 70–99)

## 2020-03-30 LAB — PROTIME-INR
INR: 1.3 — ABNORMAL HIGH (ref 0.8–1.2)
Prothrombin Time: 15.3 seconds — ABNORMAL HIGH (ref 11.4–15.2)

## 2020-03-30 LAB — PROCALCITONIN: Procalcitonin: 6.02 ng/mL

## 2020-03-30 LAB — CORTISOL-AM, BLOOD: Cortisol - AM: 20.9 ug/dL (ref 6.7–22.6)

## 2020-03-30 LAB — HEMOGLOBIN AND HEMATOCRIT, BLOOD
HCT: 22.7 % — ABNORMAL LOW (ref 36.0–46.0)
Hemoglobin: 7.4 g/dL — ABNORMAL LOW (ref 12.0–15.0)

## 2020-03-30 SURGERY — IRRIGATION AND DEBRIDEMENT FOOT
Anesthesia: General | Site: Foot | Laterality: Left

## 2020-03-30 MED ORDER — FENTANYL CITRATE (PF) 100 MCG/2ML IJ SOLN: 25.0000 ug | INTRAMUSCULAR | Status: DC | PRN

## 2020-03-30 MED ORDER — SODIUM CHLORIDE 0.9 % IV SOLN
2.0000 g | INTRAVENOUS | Status: DC
  Administered 2020-03-30 – 2020-04-01 (×3): 2 g via INTRAVENOUS
  Filled 2020-03-30 (×4): qty 2

## 2020-03-30 MED ORDER — CHLORHEXIDINE GLUCONATE 4 % EX LIQD: 60.0000 mL | Freq: Once | CUTANEOUS | Status: DC

## 2020-03-30 MED ORDER — FENTANYL CITRATE (PF) 100 MCG/2ML IJ SOLN
INTRAMUSCULAR | Status: DC | PRN
Start: 2020-03-30 — End: 2020-03-30
  Administered 2020-03-30: 100 ug via INTRAVENOUS

## 2020-03-30 MED ORDER — FENTANYL CITRATE (PF) 100 MCG/2ML IJ SOLN
INTRAMUSCULAR | Status: AC
  Filled 2020-03-30: qty 2

## 2020-03-30 MED ORDER — ONDANSETRON HCL 4 MG/2ML IJ SOLN: 4.0000 mg | Freq: Once | INTRAMUSCULAR | Status: DC | PRN

## 2020-03-30 MED ORDER — INSULIN ASPART 100 UNIT/ML ~~LOC~~ SOLN
0.0000 [IU] | Freq: Every day | SUBCUTANEOUS | Status: DC
  Administered 2020-03-30 – 2020-04-05 (×3): 2 [IU] via SUBCUTANEOUS
  Administered 2020-04-07: 3 [IU] via SUBCUTANEOUS
  Filled 2020-03-30 (×4): qty 1

## 2020-03-30 MED ORDER — PROPOFOL 10 MG/ML IV BOLUS
INTRAVENOUS | Status: DC | PRN
  Administered 2020-03-30: 100 mg via INTRAVENOUS

## 2020-03-30 MED ORDER — VANCOMYCIN HCL 750 MG/150ML IV SOLN
750.0000 mg | INTRAVENOUS | Status: DC
  Filled 2020-03-30: qty 150

## 2020-03-30 MED ORDER — LIDOCAINE HCL 1 % IJ SOLN
INTRAMUSCULAR | Status: DC | PRN
  Administered 2020-03-30: 5 mL

## 2020-03-30 MED ORDER — LIDOCAINE-EPINEPHRINE 1 %-1:100000 IJ SOLN
INTRAMUSCULAR | Status: AC
  Filled 2020-03-30: qty 1

## 2020-03-30 MED ORDER — BUPIVACAINE HCL 0.5 % IJ SOLN
INTRAMUSCULAR | Status: DC | PRN
  Administered 2020-03-30: 10 mL

## 2020-03-30 MED ORDER — DEXAMETHASONE SODIUM PHOSPHATE 10 MG/ML IJ SOLN
INTRAMUSCULAR | Status: AC
  Filled 2020-03-30: qty 1

## 2020-03-30 MED ORDER — ACETAMINOPHEN 10 MG/ML IV SOLN
INTRAVENOUS | Status: DC | PRN
  Administered 2020-03-30: 1000 mg via INTRAVENOUS

## 2020-03-30 MED ORDER — ACETAMINOPHEN 10 MG/ML IV SOLN
INTRAVENOUS | Status: AC
  Filled 2020-03-30: qty 100

## 2020-03-30 MED ORDER — ONDANSETRON HCL 4 MG/2ML IJ SOLN
INTRAMUSCULAR | Status: AC
  Filled 2020-03-30: qty 2

## 2020-03-30 MED ORDER — LIDOCAINE HCL (PF) 2 % IJ SOLN
INTRAMUSCULAR | Status: AC
  Filled 2020-03-30: qty 10

## 2020-03-30 MED ORDER — MIDAZOLAM HCL 2 MG/2ML IJ SOLN
INTRAMUSCULAR | Status: DC | PRN
  Administered 2020-03-30: 2 mg via INTRAVENOUS

## 2020-03-30 MED ORDER — PROPOFOL 10 MG/ML IV BOLUS
INTRAVENOUS | Status: AC
  Filled 2020-03-30: qty 20

## 2020-03-30 MED ORDER — DEXAMETHASONE SODIUM PHOSPHATE 10 MG/ML IJ SOLN
INTRAMUSCULAR | Status: DC | PRN
  Administered 2020-03-30: 10 mg via INTRAVENOUS

## 2020-03-30 MED ORDER — POVIDONE-IODINE 10 % EX SWAB: 2.0000 "application " | Freq: Once | CUTANEOUS | Status: DC

## 2020-03-30 MED ORDER — ALBUTEROL SULFATE HFA 108 (90 BASE) MCG/ACT IN AERS
INHALATION_SPRAY | RESPIRATORY_TRACT | Status: AC
  Filled 2020-03-30: qty 6.7

## 2020-03-30 MED ORDER — SUCCINYLCHOLINE CHLORIDE 20 MG/ML IJ SOLN
INTRAMUSCULAR | Status: DC | PRN
  Administered 2020-03-30: 100 mg via INTRAVENOUS

## 2020-03-30 MED ORDER — MIDAZOLAM HCL 2 MG/2ML IJ SOLN
INTRAMUSCULAR | Status: AC
  Filled 2020-03-30: qty 2

## 2020-03-30 MED ORDER — INSULIN ASPART 100 UNIT/ML ~~LOC~~ SOLN
0.0000 [IU] | Freq: Three times a day (TID) | SUBCUTANEOUS | Status: DC
  Administered 2020-03-31: 3 [IU] via SUBCUTANEOUS
  Administered 2020-03-31: 1 [IU] via SUBCUTANEOUS
  Administered 2020-03-31: 5 [IU] via SUBCUTANEOUS
  Administered 2020-04-01 (×2): 1 [IU] via SUBCUTANEOUS
  Administered 2020-04-02: 3 [IU] via SUBCUTANEOUS
  Administered 2020-04-02: 1 [IU] via SUBCUTANEOUS
  Administered 2020-04-02: 2 [IU] via SUBCUTANEOUS
  Administered 2020-04-03: 3 [IU] via SUBCUTANEOUS
  Administered 2020-04-03: 5 [IU] via SUBCUTANEOUS
  Administered 2020-04-03 – 2020-04-05 (×4): 2 [IU] via SUBCUTANEOUS
  Administered 2020-04-06: 3 [IU] via SUBCUTANEOUS
  Administered 2020-04-06: 2 [IU] via SUBCUTANEOUS
  Administered 2020-04-06: 3 [IU] via SUBCUTANEOUS
  Administered 2020-04-07: 7 [IU] via SUBCUTANEOUS
  Administered 2020-04-07: 3 [IU] via SUBCUTANEOUS
  Administered 2020-04-08: 7 [IU] via SUBCUTANEOUS
  Administered 2020-04-08: 5 [IU] via SUBCUTANEOUS
  Administered 2020-04-08: 1 [IU] via SUBCUTANEOUS
  Administered 2020-04-09: 3 [IU] via SUBCUTANEOUS
  Administered 2020-04-09: 1 [IU] via SUBCUTANEOUS
  Filled 2020-03-30 (×24): qty 1

## 2020-03-30 MED ORDER — METRONIDAZOLE IN NACL 5-0.79 MG/ML-% IV SOLN
500.0000 mg | Freq: Three times a day (TID) | INTRAVENOUS | Status: DC
  Administered 2020-03-30 – 2020-04-09 (×29): 500 mg via INTRAVENOUS
  Filled 2020-03-30 (×33): qty 100

## 2020-03-30 SURGICAL SUPPLY — 65 items
BLADE OSC/SAGITTAL MD 5.5X18 (BLADE) ×2 IMPLANT
BLADE OSCILLATING/SAGITTAL (BLADE)
BLADE SW THK.38XMED LNG THN (BLADE) IMPLANT
BNDG COHESIVE 4X5 TAN STRL (GAUZE/BANDAGES/DRESSINGS) ×2 IMPLANT
BNDG COHESIVE 6X5 TAN STRL LF (GAUZE/BANDAGES/DRESSINGS) ×2 IMPLANT
BNDG CONFORM 2 STRL LF (GAUZE/BANDAGES/DRESSINGS) ×2 IMPLANT
BNDG CONFORM 3 STRL LF (GAUZE/BANDAGES/DRESSINGS) ×2 IMPLANT
BNDG ELASTIC 4X5.8 VLCR STR LF (GAUZE/BANDAGES/DRESSINGS) ×2 IMPLANT
BNDG ESMARK 4X12 TAN STRL LF (GAUZE/BANDAGES/DRESSINGS) ×2 IMPLANT
BNDG GAUZE 4.5X4.1 6PLY STRL (MISCELLANEOUS) ×2 IMPLANT
CANISTER SUCT 1200ML W/VALVE (MISCELLANEOUS) ×2 IMPLANT
CANISTER SUCT 3000ML PPV (MISCELLANEOUS) ×2 IMPLANT
CANISTER WOUND CARE 500ML ATS (WOUND CARE) ×2 IMPLANT
COVER WAND RF STERILE (DRAPES) ×2 IMPLANT
CUFF TOURN SGL QUICK 12 (TOURNIQUET CUFF) IMPLANT
CUFF TOURN SGL QUICK 18X4 (TOURNIQUET CUFF) IMPLANT
CUFF TOURN SGL QUICK 24 (TOURNIQUET CUFF) ×1
CUFF TRNQT CYL 24X4X16.5-23 (TOURNIQUET CUFF) ×1 IMPLANT
DRAPE FLUOR MINI C-ARM 54X84 (DRAPES) ×2 IMPLANT
DRAPE XRAY CASSETTE 23X24 (DRAPES) IMPLANT
DRSG MEPILEX FLEX 3X3 (GAUZE/BANDAGES/DRESSINGS) IMPLANT
DURAPREP 26ML APPLICATOR (WOUND CARE) ×2 IMPLANT
ELECT REM PT RETURN 9FT ADLT (ELECTROSURGICAL) ×2
ELECTRODE REM PT RTRN 9FT ADLT (ELECTROSURGICAL) ×1 IMPLANT
GAUZE 4X4 16PLY RFD (DISPOSABLE) ×2 IMPLANT
GAUZE PACKING 1/4 X5 YD (GAUZE/BANDAGES/DRESSINGS) ×2 IMPLANT
GAUZE PACKING IODOFORM 1X5 (PACKING) ×2 IMPLANT
GAUZE SPONGE 4X4 12PLY STRL (GAUZE/BANDAGES/DRESSINGS) ×2 IMPLANT
GAUZE XEROFORM 1X8 LF (GAUZE/BANDAGES/DRESSINGS) ×2 IMPLANT
GLOVE BIO SURGEON STRL SZ7.5 (GLOVE) ×2 IMPLANT
GLOVE INDICATOR 8.0 STRL GRN (GLOVE) ×2 IMPLANT
GOWN STRL REUS W/ TWL XL LVL3 (GOWN DISPOSABLE) ×2 IMPLANT
GOWN STRL REUS W/TWL MED LVL3 (GOWN DISPOSABLE) ×4 IMPLANT
GOWN STRL REUS W/TWL XL LVL3 (GOWN DISPOSABLE) ×2
HANDPIECE VERSAJET DEBRIDEMENT (MISCELLANEOUS) ×2 IMPLANT
IV NS 1000ML (IV SOLUTION) ×1
IV NS 1000ML BAXH (IV SOLUTION) ×1 IMPLANT
KIT DRSG VAC SLVR GRANUFM (MISCELLANEOUS) ×2 IMPLANT
KIT TURNOVER KIT A (KITS) ×2 IMPLANT
LABEL OR SOLS (LABEL) ×2 IMPLANT
NEEDLE FILTER BLUNT 18X 1/2SAF (NEEDLE) ×1
NEEDLE FILTER BLUNT 18X1 1/2 (NEEDLE) ×1 IMPLANT
NEEDLE HYPO 25X1 1.5 SAFETY (NEEDLE) ×2 IMPLANT
NS IRRIG 500ML POUR BTL (IV SOLUTION) ×2 IMPLANT
PACK EXTREMITY (MISCELLANEOUS) ×2 IMPLANT
PAD ABD DERMACEA PRESS 5X9 (GAUZE/BANDAGES/DRESSINGS) ×2 IMPLANT
PULSAVAC PLUS IRRIG FAN TIP (DISPOSABLE) ×2
RASP SM TEAR CROSS CUT (RASP) IMPLANT
SHIELD FULL FACE ANTIFOG 7M (MISCELLANEOUS) ×2 IMPLANT
SOL .9 NS 3000ML IRR  AL (IV SOLUTION) ×1
SOL .9 NS 3000ML IRR UROMATIC (IV SOLUTION) ×1 IMPLANT
SOL PREP PVP 2OZ (MISCELLANEOUS) ×2
SOLUTION PREP PVP 2OZ (MISCELLANEOUS) ×1 IMPLANT
STOCKINETTE IMPERVIOUS 9X36 MD (GAUZE/BANDAGES/DRESSINGS) ×2 IMPLANT
SUT ETHILON 2 0 FS 18 (SUTURE) ×4 IMPLANT
SUT ETHILON 4 0 P 3 18 (SUTURE) ×4 IMPLANT
SUT ETHILON 4-0 (SUTURE) ×1
SUT ETHILON 4-0 FS2 18XMFL BLK (SUTURE) ×1
SUT VIC AB 3-0 SH 27 (SUTURE) ×1
SUT VIC AB 3-0 SH 27X BRD (SUTURE) ×1 IMPLANT
SUT VIC AB 4-0 FS2 27 (SUTURE) ×2 IMPLANT
SUTURE ETHLN 4-0 FS2 18XMF BLK (SUTURE) ×1 IMPLANT
SWAB CULTURE AMIES ANAERIB BLU (MISCELLANEOUS) ×2 IMPLANT
SYR 10ML LL (SYRINGE) ×4 IMPLANT
TIP FAN IRRIG PULSAVAC PLUS (DISPOSABLE) ×1 IMPLANT

## 2020-03-30 NOTE — Progress Notes (Signed)
Pharmacy - Brief Note (cefepime dosing)  Refer to pharmacist's note earlier today for vancomycin dosing.  ID consulted and plan to add cefepime and metronidazole based on OR findings of large necrosis.    Plan  Cefepime 2gm IV q24h based on renal function .  Watch renal function closely and f/u need to adjust vancomycin and/or cefepime. Scr ordered for am  Follow OR cultures  Doreene Eland, PharmD, BCPS.   Work Cell: [phone] 03/30/2020 5:29 PM

## 2020-03-30 NOTE — Op Note (Signed)
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  Local consisted of a one-to-one mixture of 0.5% bupivacaine and 1% lidocaine plain    Hemostasis: Mid calf tourniquet inflated to 200 mmHg for approximately 20 min    Specimen: 5th ray amputation with proximal margin inked and deep wound culture    Complications: None    Operative indications:[name] is an 64 y.o. that presents today for surgical intervention.  The risks/benefits/alternatives/complications have been discussed and consent has been given.    Procedure:  Patient was brought into the OR and placed on the operating table in thesupine position. After anesthesia was obtained theleft lower extremity was prepped and draped in usual sterile fashion.  Attention was directed to the lateral aspect of the left foot along the 5th ray there was a very large necrotic ulceration with severe deep necrosis noted.  A lateral incision was performed from the base of the 5th metatarsal extending to the metatarsophalangeal joint circumferential around the 5th toe.  Full-thickness incision was performed.  The necrotic tissue was full-thickness excised down to the joint.  All of the tissue was then removed after osteotomy was created in the midshaft of the metatarsal.  Deep wound culture was performed of the tissue.  The proximal margin of the right amputation was marked.  Next the deeper tissue was further irrigated and debrided with a versa jet down to the level of bone.  All of the necrotic nonviable soft tissue was excised at this time.  At this time the tourniquet was then dropped.  Bleeders were Bovie cauterized as appropriate.  The skin was then closed proximal and distal with a 3-0 nylon.  The central aspect of the ulcer was left open.  This  was then packed with a wound VAC.    Patient tolerated the procedure and anesthesia well.  Was transported from the OR to the PACU with all vital signs stable and vascular status intact. To be discharged per routine protocol.  Patient will be readmitted for further evaluation.Marland Kitchen

## 2020-03-30 NOTE — ED Notes (Signed)
Patient having an ECHO done

## 2020-03-30 NOTE — Consult Note (Signed)
Infectious Disease     Reason for Consult:DFI and osteomyelitis    Referring Physician: Dr Vickki Muff Date of Admission:  03/29/2020   Principal Problem:   Sepsis ([place]) Active Problems:   Asthma   Benign essential HTN   Chronic kidney disease (CKD), stage V (HCC)   Chronic systolic CHF (congestive heart failure) (HCC)   Depression with anxiety   Type 2 diabetes mellitus with diabetic chronic kidney disease (HCC)   MI (mitral incompetence)   Carcinoma of upper-inner quadrant of left breast in female, estrogen receptor positive ([place])   Metastasis from malignant tumor of breast ([place])   Anemia of chronic disease   HPI: [name] is a 64 y.o. female with PMHx metastatic breast CA, DM, CKD, CHF, admitted 8/26 with open, draining foot wound, and fever. Pt states that she recently re-established care with the wound clinic for a woudn to her left foot. It has been present for "a while" but has become increasingly malodorous and is spreading over past several days. She had been on keflex and was switched to Bactrim.  Otpt cx with MRSA. On admit wbc 25, temp 101.9. MRI with cellulitis and osteo of 5th MT and 5th toe. Underwent today 5th ray amputation with necrotic gangrenous tissue removed.   Past Medical History:  Diagnosis Date  . Anemia   . Anxiety   . Asthma   . Cancer ([place]) 03/10/2018   Per [facility] PET order. Carcinoma of upper-inner quadrant of left breast in female, estrogen receptor positive .  Marland Kitchen Cancer (HCC)    LUNG  . CHF (congestive heart failure) ([place]) 1997  . CKD (chronic kidney disease)   . Depression   . Diabetes mellitus, type 2 ([place])   . Family history of breast cancer   . Family history of colon cancer   . Family history of ovarian cancer   . Family history of pancreatic cancer   . Family history of prostate cancer   . Family history of stomach cancer   . GERD (gastroesophageal reflux disease)    history of an ulcer  . Hair loss   . History of left breast cancer  05/29/14  . History of partial hysterectomy 12/31/2016   Per patient.  Has not had a period in years.  Had a partial hysterectomy years ago.  Marland Kitchen Hypertension   . Mitral valve regurgitation   . Neuromuscular disorder (HCC)    neuropathies in hand  . Obesity   . Pancreatitis 1997  . Stroke [facility]) 2010   with mild left arm weakness   Past Surgical History:  Procedure Laterality Date  . CATARACT EXTRACTION W/PHACO Right 02/24/2019   Procedure: CATARACT EXTRACTION PHACO AND INTRAOCULAR LENS PLACEMENT ([place]) RIGHT DIABETES;  Surgeon: Marchia Meiers, MD;  Location: [facility] ORS;  Service: Ophthalmology;  Laterality: Right;  [place] 01:13.0 CDE 7.96 Fluid Pack Lot # [id number]  . CATARACT EXTRACTION W/PHACO Left 03/24/2019   Procedure: CATARACT EXTRACTION PHACO AND INTRAOCULAR LENS PLACEMENT ([facility]) - left diabetic;  Surgeon: Marchia Meiers, MD;  Location: [facility] ORS;  Service: Ophthalmology;  Laterality: Left;  [place]  01:36 CDE 13.93 Fluid pack lot # [id number]  . CESAREAN SECTION    . CHOLECYSTECTOMY    . EXCISION OF TONGUE LESION N/A 08/17/2018   Procedure: EXCISION OF TONGUE LESION WITH FROZEN SECTIONS;  Surgeon: Beverly Gust, MD;  Location: [facility] ORS;  Service: ENT;  Laterality: N/A;  . EYE SURGERY Right    cataract extraction  . PARTIAL HYSTERECTOMY  12/31/2016  Per patient, she has not had a period in years since she had a partial hysterectomy.  Marland Kitchen PORTA CATH INSERTION    . TUBAL LIGATION     Social History   Tobacco Use  . Smoking status: Former Smoker    Packs/day: 0.50    Years: 1.00    Pack years: 0.50    Types: Cigarettes  . Smokeless tobacco: Never Used  Vaping Use  . Vaping Use: Never used  Substance Use Topics  . Alcohol use: No    Alcohol/week: 0.0 standard drinks  . Drug use: No   Family History  Problem Relation Age of Onset  . Ovarian cancer Mother 26  . Diabetes Mother   . Hypertension Mother   . COPD Father   . Hypertension Father   . Colon cancer Father 71  . Diabetes  Sister   . Breast cancer Sister 4       bilateral  . Diabetes Brother   . Leukemia Maternal Aunt   . Pancreatic cancer Paternal Aunt 34  . Pancreatic cancer Paternal Uncle   . Colon cancer Paternal Uncle   . Stomach cancer Maternal Grandfather 87  . Throat cancer Paternal Grandmother   . Breast cancer Maternal Aunt 80  . Colon cancer Maternal Aunt   . Bone cancer Maternal Aunt   . Breast cancer Paternal Aunt        dx >50  . Prostate cancer Paternal Uncle   . Pancreatic cancer Paternal Uncle   . Throat cancer Paternal Uncle   . Lung cancer Paternal Uncle   . Stomach cancer Paternal Uncle   . Brain cancer Paternal Aunt   . Cancer Cousin        liver, kidney  . Prostate cancer Cousin        meastatic  . Lung cancer Other     Allergies:  Allergies  Allergen Reactions  . Fish-Derived Products   . Sulfamethoxazole-Trimethoprim Other (See Comments)    Current antibiotics: Antibiotics Given (last 72 hours)    Date/Time Action Medication Dose Rate   03/29/20 1559 New Bag/Given   ceFEPIme (MAXIPIME) 2 g in sodium chloride 0.9 % 100 mL IVPB 2 g 200 mL/hr   03/29/20 1710 New Bag/Given   vancomycin (VANCOCIN) IVPB 1000 mg/200 mL premix 1,000 mg 200 mL/hr   03/29/20 1710 New Bag/Given   vancomycin (VANCOREADY) IVPB 500 mg/100 mL 500 mg 100 mL/hr   03/29/20 1712 New Bag/Given   metroNIDAZOLE (FLAGYL) IVPB 500 mg 500 mg 100 mL/hr      MEDICATIONS: . amLODipine  10 mg Oral Daily  . aspirin EC  81 mg Oral Daily  . atenolol  100 mg Oral BID  . calcitRIOL  0.25 mcg Oral Daily  . cloNIDine  0.2 mg Oral BID  . docusate sodium  100 mg Oral BID  . enoxaparin (LOVENOX) injection  30 mg Subcutaneous Q24H  . famotidine  20 mg Oral BID  . ferrous sulfate  325 mg Oral BID WC  . FLUoxetine  20 mg Oral BID  . insulin aspart  0-5 Units Subcutaneous QHS  . insulin aspart  0-9 Units Subcutaneous TID WC  . insulin detemir  55 Units Subcutaneous Daily  . senna  1 tablet Oral BID  .  simvastatin  20 mg Oral QPM    Review of Systems - 11 systems reviewed and negative per HPI   OBJECTIVE: Temp:  [97.9 F (36.6 C)-99 F (37.2 C)] 98.4 F (36.9 C) (08/27  1530) Pulse Rate:  [70-88] 70 (08/27 1530) Resp:  [14-21] 16 (08/27 1530) BP: (104-133)/(57-82) 112/80 (08/27 1530) SpO2:  [89 %-100 %] 96 % (08/27 1530) Physical Exam  Constitutional: He is oriented to person, place, and time. He appears well-developed and well-nourished. No distress.  Mouth/Throat: Oropharynx is clear and moist. .  Cardiovascular: Normal rate, regular rhythm and normal heart sounds. Pulmonary/Chest: Effort normal and breath sounds normal. No respiratory distress. He has no wheezes.  Abdominal: Soft. Bowel sounds are normal. He exhibits no distension. There is no tenderness.  Lymphadenopathy: He has no cervical adenopathy.  Neurological: He is alert and oriented to person, place, and time.  Skin: L foot wrapped R leg with edema, wrapped post op  Psychiatric: He has a normal mood and affect. His behavior is normal.  LABS: Results for orders placed or performed during the hospital encounter of 03/29/20 (from the past 48 hour(s))  Lactic acid, plasma     Status: Abnormal   Collection Time: 03/29/20 11:27 AM  Result Value Ref Range   Lactic Acid, Venous 2.1 (HH) 0.5 - 1.9 mmol/L    Comment: CRITICAL RESULT CALLED TO, READ BACK BY AND VERIFIED WITH ANNA HOLT RN AT 7121 ON 03/29/20 SNG Performed at [facility], [address], [place], [place] [address]   Comprehensive metabolic panel     Status: Abnormal   Collection Time: 03/29/20 11:27 AM  Result Value Ref Range   Sodium 133 (L) 135 - 145 mmol/L   Potassium 4.8 3.5 - 5.1 mmol/L   Chloride 100 98 - 111 mmol/L   CO2 22 22 - 32 mmol/L   Glucose, Bld 381 (H) 70 - 99 mg/dL    Comment: Glucose reference range applies only to samples taken after fasting for at least 8 hours.   BUN 37 (H) 8 - 23 mg/dL   Creatinine, Ser 2.74 (H) 0.44 -  1.00 mg/dL   Calcium 8.5 (L) 8.9 - 10.3 mg/dL   Total Protein 7.7 6.5 - 8.1 g/dL   Albumin 3.0 (L) 3.5 - 5.0 g/dL   AST 78 (H) 15 - 41 U/L   ALT 55 (H) 0 - 44 U/L   Alkaline Phosphatase 94 38 - 126 U/L   Total Bilirubin 0.8 0.3 - 1.2 mg/dL   GFR calc non Af Amer 18 (L) >60 mL/min   GFR calc Af Amer 21 (L) >60 mL/min   Anion gap 11 5 - 15    Comment: Performed at [facility], [address] [place], [place] [address]  CBC with Differential     Status: Abnormal   Collection Time: 03/29/20 11:27 AM  Result Value Ref Range   WBC 25.2 (H) 4.0 - 10.5 K/uL   RBC 2.86 (L) 3.87 - 5.11 MIL/uL   Hemoglobin 8.6 (L) 12.0 - 15.0 g/dL   HCT 26.1 (L) 36 - 46 %   MCV 91.3 80.0 - 100.0 fL   MCH 30.1 26.0 - 34.0 pg   MCHC 33.0 30.0 - 36.0 g/dL   RDW 14.6 11.5 - 15.5 %   Platelets 359 150 - 400 K/uL   nRBC 0.0 0.0 - 0.2 %   Neutrophils Relative % 92 %   Neutro Abs 23.2 (H) 1.7 - 7.7 K/uL   Lymphocytes Relative 1 %   Lymphs Abs 0.2 (L) 0.7 - 4.0 K/uL   Monocytes Relative 6 %   Monocytes Absolute 1.5 (H) 0 - 1 K/uL   Eosinophils Relative 0 %  Eosinophils Absolute 0.0 0 - 0 K/uL   Basophils Relative 0 %   Basophils Absolute 0.0 0 - 0 K/uL   WBC Morphology MORPHOLOGY UNREMARKABLE    RBC Morphology MORPHOLOGY UNREMARKABLE    Smear Review MORPHOLOGY UNREMARKABLE    Immature Granulocytes 1 %   Abs Immature Granulocytes 0.21 (H) 0.00 - 0.07 K/uL    Comment: Performed at [facility], [address] [place], [place] [address]  C-reactive protein     Status: Abnormal   Collection Time: 03/29/20  3:35 PM  Result Value Ref Range   CRP 20.2 (H) <1.0 mg/dL    Comment: Performed at [facility] [address] [place], [place] [address]  Lactic acid, plasma     Status: None   Collection Time: 03/29/20  3:51 PM  Result Value Ref Range   Lactic Acid, Venous 1.4 0.5 - 1.9 mmol/L    Comment: Performed at [facility], [address] [place], [place] [address]   Blood culture (routine x 2)     Status: None (Preliminary result)   Collection Time: 03/29/20  3:51 PM   Specimen: BLOOD  Result Value Ref Range   Specimen Description BLOOD BLOOD RIGHT HAND    Special Requests      BOTTLES DRAWN AEROBIC AND ANAEROBIC Blood Culture adequate volume   Culture      NO GROWTH < 24 HOURS Performed at [name], [address] [place], [place] [address]    Report Status PENDING   Blood culture (routine x 2)     Status: None (Preliminary result)   Collection Time: 03/29/20  3:51 PM   Specimen: BLOOD  Result Value Ref Range   Specimen Description BLOOD LEFT ANTECUBITAL    Special Requests      BOTTLES DRAWN AEROBIC AND ANAEROBIC Blood Culture adequate volume   Culture      NO GROWTH < 24 HOURS Performed at [facility], [address] [place], [place] [address]    Report Status PENDING   Sedimentation rate     Status: Abnormal   Collection Time: 03/29/20  3:51 PM  Result Value Ref Range   Sed Rate 106 (H) 0 - 30 mm/hr    Comment: Performed at [facility], [address] [place], [place] [address]  SARS Coronavirus 2 by RT PCR (hospital order, performed in [facility] hospital lab) Nasopharyngeal Nasopharyngeal Swab     Status: None   Collection Time: 03/29/20  5:15 PM   Specimen: Nasopharyngeal Swab  Result Value Ref Range   SARS Coronavirus 2 NEGATIVE NEGATIVE    Comment: (NOTE) SARS-CoV-2 target nucleic acids are NOT DETECTED.  The SARS-CoV-2 RNA is generally detectable in upper and lower respiratory specimens during the acute phase of infection. The lowest concentration of SARS-CoV-2 viral copies this assay can detect is 250 copies / mL. A negative result does not preclude SARS-CoV-2 infection and should not be used as the sole basis for treatment or other patient management decisions.  A negative result may occur with improper specimen collection / handling, submission of specimen other than  nasopharyngeal swab, presence of viral mutation(s) within the areas targeted by this assay, and inadequate number of viral copies (<250 copies / mL). A negative result must be combined with clinical observations, patient history, and epidemiological information.  Fact Sheet for Patients:   StrictlyIdeas.no  Fact Sheet for Healthcare Providers: BankingDealers.co.za  This test is not yet approved or  cleared by  the [employer] and has been authorized for detection and/or diagnosis of SARS-CoV-2 by FDA under an Emergency Use Authorization (EUA).  This EUA will remain in effect (meaning this test can be used) for the duration of the COVID-19 declaration under Section 564(b)(1) of the Act, 21 U.S.C. section 360bbb-3(b)(1), unless the authorization is terminated or revoked sooner.  Performed at [facility], [address] [place], [place] [address]   Protime-INR     Status: Abnormal   Collection Time: 03/30/20  4:53 AM  Result Value Ref Range   Prothrombin Time 15.3 (H) 11.4 - 15.2 seconds   INR 1.3 (H) 0.8 - 1.2    Comment: (NOTE) INR goal varies based on device and disease states. Performed at [facility], [place], [place] [address]   Procalcitonin     Status: None   Collection Time: 03/30/20  4:53 AM  Result Value Ref Range   Procalcitonin 6.02 ng/mL    Comment:        Interpretation: PCT > 2 ng/mL: Systemic infection (sepsis) is likely, unless other causes are known. (NOTE)       Sepsis PCT Algorithm           Lower Respiratory Tract                                      Infection PCT Algorithm    ----------------------------     ----------------------------         PCT < 0.25 ng/mL                PCT < 0.10 ng/mL          Strongly encourage             Strongly discourage   discontinuation of antibiotics    initiation of antibiotics    ----------------------------      -----------------------------       PCT 0.25 - 0.50 ng/mL            PCT 0.10 - 0.25 ng/mL               OR       >80% decrease in PCT            Discourage initiation of                                            antibiotics      Encourage discontinuation           of antibiotics    ----------------------------     -----------------------------         PCT >= 0.50 ng/mL              PCT 0.26 - 0.50 ng/mL               AND       <80% decrease in PCT              Encourage initiation of                                             antibiotics  Encourage continuation           of antibiotics    ----------------------------     -----------------------------        PCT >= 0.50 ng/mL                  PCT > 0.50 ng/mL               AND         increase in PCT                  Strongly encourage                                      initiation of antibiotics    Strongly encourage escalation           of antibiotics                                     -----------------------------                                           PCT <= 0.25 ng/mL                                                 OR                                        > 80% decrease in PCT                                      Discontinue / Do not initiate                                             antibiotics  Performed at [facility], [address] [place], [place] [address]   Comprehensive metabolic panel     Status: Abnormal   Collection Time: 03/30/20  4:53 AM  Result Value Ref Range   Sodium 137 135 - 145 mmol/L   Potassium 4.5 3.5 - 5.1 mmol/L   Chloride 105 98 - 111 mmol/L   CO2 23 22 - 32 mmol/L   Glucose, Bld 160 (H) 70 - 99 mg/dL    Comment: Glucose reference range applies only to samples taken after fasting for at least 8 hours.   BUN 36 (H) 8 - 23 mg/dL   Creatinine, Ser 2.53 (H) 0.44 - 1.00 mg/dL   Calcium 8.5 (L) 8.9 - 10.3 mg/dL   Total Protein 6.9 6.5 - 8.1 g/dL   Albumin 2.6 (L) 3.5 -  5.0 g/dL   AST 30 15 - 41 U/L   ALT 38 0 - 44 U/L   Alkaline Phosphatase 77 38 - 126 U/L   Total Bilirubin 1.0 0.3 - 1.2 mg/dL  GFR calc non Af Amer 20 (L) >60 mL/min   GFR calc Af Amer 23 (L) >60 mL/min   Anion gap 9 5 - 15    Comment: Performed at [facility], [address] [place], [place] [address]  CBC     Status: Abnormal   Collection Time: 03/30/20  4:53 AM  Result Value Ref Range   WBC 21.2 (H) 4.0 - 10.5 K/uL   RBC 2.42 (L) 3.87 - 5.11 MIL/uL   Hemoglobin 7.3 (L) 12.0 - 15.0 g/dL   HCT 22.0 (L) 36 - 46 %   MCV 90.9 80.0 - 100.0 fL   MCH 30.2 26.0 - 34.0 pg   MCHC 33.2 30.0 - 36.0 g/dL   RDW 14.6 11.5 - 15.5 %   Platelets 346 150 - 400 K/uL   nRBC 0.0 0.0 - 0.2 %    Comment: Performed at [facility], [address] [place], [place] [address]  Cortisol-am, blood     Status: None   Collection Time: 03/30/20  4:54 AM  Result Value Ref Range   Cortisol - AM 20.9 6.7 - 22.6 ug/dL    Comment: Performed at [facility] [address] [place], [place] [address]  Glucose, capillary     Status: Abnormal   Collection Time: 03/30/20 11:04 AM  Result Value Ref Range   Glucose-Capillary 130 (H) 70 - 99 mg/dL    Comment: Glucose reference range applies only to samples taken after fasting for at least 8 hours.  Glucose, capillary     Status: Abnormal   Collection Time: 03/30/20  1:47 PM  Result Value Ref Range   Glucose-Capillary 121 (H) 70 - 99 mg/dL    Comment: Glucose reference range applies only to samples taken after fasting for at least 8 hours.   No components found for: ESR, C REACTIVE PROTEIN MICRO: Recent Results (from the past 720 hour(s))  Aerobic Culture (superficial specimen)     Status: None   Collection Time: 03/26/20  3:10 PM   Specimen: Wound  Result Value Ref Range Status   Specimen Description   Final    WOUND Performed at [facility], [address], [place], [place] [address]    Special Requests   Final     LEFT FOOT Performed at [facility], [address] [place], [place] [address]    Gram Stain   Final    NO WBC SEEN MODERATE GRAM POSITIVE COCCI FEW GRAM NEGATIVE RODS    Culture   Final    FEW METHICILLIN RESISTANT STAPHYLOCOCCUS AUREUS MIXED WITHIN OTHER ORGANISMS Performed at [facility], [place] [address] [place], [place] [address]    Report Status 03/29/2020 FINAL  Final   Organism ID, Bacteria METHICILLIN RESISTANT STAPHYLOCOCCUS AUREUS  Final      Susceptibility   Methicillin resistant staphylococcus aureus - MIC*    CIPROFLOXACIN >=8 RESISTANT Resistant     ERYTHROMYCIN >=8 RESISTANT Resistant     GENTAMICIN <=0.5 SENSITIVE Sensitive     OXACILLIN >=4 RESISTANT Resistant     TETRACYCLINE <=1 SENSITIVE Sensitive     VANCOMYCIN 1 SENSITIVE Sensitive     TRIMETH/SULFA <=10 SENSITIVE Sensitive     CLINDAMYCIN 1 INTERMEDIATE Intermediate     RIFAMPIN 16 RESISTANT Resistant     Inducible Clindamycin NEGATIVE Sensitive     * FEW METHICILLIN RESISTANT STAPHYLOCOCCUS AUREUS  Blood culture (routine x 2)     Status: None (Preliminary result)  Collection Time: 03/29/20  3:51 PM   Specimen: BLOOD  Result Value Ref Range Status   Specimen Description BLOOD BLOOD RIGHT HAND  Final   Special Requests   Final    BOTTLES DRAWN AEROBIC AND ANAEROBIC Blood Culture adequate volume   Culture   Final    NO GROWTH < 24 HOURS Performed at [facility], [address] [place], [place] [address]    Report Status PENDING  Incomplete  Blood culture (routine x 2)     Status: None (Preliminary result)   Collection Time: 03/29/20  3:51 PM   Specimen: BLOOD  Result Value Ref Range Status   Specimen Description BLOOD LEFT ANTECUBITAL  Final   Special Requests   Final    BOTTLES DRAWN AEROBIC AND ANAEROBIC Blood Culture adequate volume   Culture   Final    NO GROWTH < 24 HOURS Performed at [facility], [address] [place], [place] [address]    Report  Status PENDING  Incomplete  SARS Coronavirus 2 by RT PCR (hospital order, performed in [facility] hospital lab) Nasopharyngeal Nasopharyngeal Swab     Status: None   Collection Time: 03/29/20  5:15 PM   Specimen: Nasopharyngeal Swab  Result Value Ref Range Status   SARS Coronavirus 2 NEGATIVE NEGATIVE Final    Comment: (NOTE) SARS-CoV-2 target nucleic acids are NOT DETECTED.  The SARS-CoV-2 RNA is generally detectable in upper and lower respiratory specimens during the acute phase of infection. The lowest concentration of SARS-CoV-2 viral copies this assay can detect is 250 copies / mL. A negative result does not preclude SARS-CoV-2 infection and should not be used as the sole basis for treatment or other patient management decisions.  A negative result may occur with improper specimen collection / handling, submission of specimen other than nasopharyngeal swab, presence of viral mutation(s) within the areas targeted by this assay, and inadequate number of viral copies (<250 copies / mL). A negative result must be combined with clinical observations, patient history, and epidemiological information.  Fact Sheet for Patients:   StrictlyIdeas.no  Fact Sheet for Healthcare Providers: BankingDealers.co.za  This test is not yet approved or  cleared by the [place] FDA and has been authorized for detection and/or diagnosis of SARS-CoV-2 by FDA under an Emergency Use Authorization (EUA).  This EUA will remain in effect (meaning this test can be used) for the duration of the COVID-19 declaration under Section 564(b)(1) of the Act, 21 U.S.C. section 360bbb-3(b)(1), unless the authorization is terminated or revoked sooner.  Performed at [facility], [address] [place], [place] [address]     IMAGING: [place] Chest 2 View  Result Date: 03/29/2020 CLINICAL DATA:  Shortness of breath and dizziness.  Breast carcinoma EXAM:  CHEST - 2 VIEW COMPARISON:  Chest radiograph September 27, 2019; PET-CT March 28, 2020 FINDINGS: There are pleural effusions bilaterally with bibasilar atelectatic change. The masslike area in the lingula seen on recent PET study is appreciable by radiography measuring 1.4 x 1.3 cm. Lungs elsewhere are clear. Heart is upper normal in size with pulmonary vascularity normal. Port-A-Cath tip is in the superior vena cava. No adenopathy is evident by radiography. IMPRESSION: Stable nodular opacity in the lingula measuring 1.4 x 1.3 cm. Bilateral pleural effusions with bibasilar atelectasis. No new opacity evident compared to recent PET study. Heart upper normal in size.  Port-A-Cath tip in superior vena cava. Electronically Signed   By: Lowella Grip III M.D.   On:  03/29/2020 12:33   CT Head Wo Contrast  Result Date: 03/01/2020 CLINICAL DATA:  Head trauma fall metastatic breast cancer EXAM: CT HEAD WITHOUT CONTRAST TECHNIQUE: Contiguous axial images were obtained from the base of the skull through the vertex without intravenous contrast. COMPARISON:  PET CT 12/12/2019 FINDINGS: Brain: No acute territorial infarction, hemorrhage, or intracranial mass. Minimal white matter hypodensity on the right. Nonenlarged ventricles. Vascular: No hyperdense vessels.  No unexpected calcification Skull: No fracture Sinuses/Orbits: No acute finding. Other: None IMPRESSION: 1. No CT evidence for acute intracranial abnormality. 2. Minimal white matter hypodensity, may reflect small vessel ischemic change. Electronically Signed   By: Donavan Foil M.D.   On: 03/01/2020 18:45   CT Cervical Spine Wo Contrast  Result Date: 03/01/2020 CLINICAL DATA:  Fall EXAM: CT CERVICAL SPINE WITHOUT CONTRAST TECHNIQUE: Multidetector CT imaging of the cervical spine was performed without intravenous contrast. Multiplanar CT image reconstructions were also generated. COMPARISON:  None. FINDINGS: Alignment: Mild reversal of cervical lordosis. No  subluxation. Facet alignment within normal limits. Skull base and vertebrae: No acute fracture. No primary bone lesion or focal pathologic process. Soft tissues and spinal canal: No prevertebral fluid or swelling. No visible canal hematoma. Disc levels:  Mild degenerative changes at C5-C6 and C6-C7. Upper chest: Negative. Other: None IMPRESSION: Mild reversal of cervical lordosis. No acute osseous abnormality. Electronically Signed   By: Donavan Foil M.D.   On: 03/01/2020 18:48   MR FOOT LEFT WO CONTRAST  Result Date: 03/30/2020 CLINICAL DATA:  Foot swelling, diabetes, possible osteomyelitis. Gas in the soft tissues at the base of the small toe. EXAM: MRI OF THE LEFT FOOT WITHOUT CONTRAST TECHNIQUE: Multiplanar, multisequence MR imaging of the left forefoot was performed. No intravenous contrast was administered. COMPARISON:  Radiographs 03/29/2020 FINDINGS: Bones/Joint/Cartilage Abnormal edema, cortical destruction/demineralization, and reduced T1 signal in the distal metaphysis and head of the fifth metatarsal and in the proximal phalanx of the small toe (especially proximally) compatible with osteomyelitis. Septic joint is a distinct possibility although the lack of a substantial joint effusion may indicate that the joint is draining to the plantar skin surface. There is synovitis in the fifth MTP joint as well as gas along the margins of the joint capsule. No other regions of osteomyelitis identified. Ligaments Lisfranc ligament intact. Muscles and Tendons Diffuse edema within and along regional musculature which could be neurogenic or due to myositis. Soft tissues Lateral and plantar to the fifth MTP joint there is abnormal gas, suspected cutaneous ulceration, and extensive inflammation. Some tiny amount of metal artifact in this vicinity possibly from bandaging, there is no visible metal foreign body on recent radiographs. Cannot exclude draining sinus tract to the skin from the fifth MTP joint. Do not  see a readily drainable abscess although there is extensive subcutaneous edema tracking in the foot, especially dorsally, and into the toes. Cellulitis is a distinct possibility. IMPRESSION: 1. Osteomyelitis of the distal metaphysis and head of the fifth metatarsal and in the proximal phalanx of the small toe. 2. Fifth MTP septic joint is a distinct possibility although the lack of a substantial joint effusion may indicate that the joint is draining to the plantar or lateral surfaces. Loculations of gas in the soft tissues along the margins of the fifth MTP joint. 3. Extensive subcutaneous edema tracking in the foot, especially dorsally, and into the toes. Cellulitis is a distinct possibility. 4. Diffuse edema within and along regional musculature which could be neurogenic or due to myositis. Electronically Signed  By: Van Clines M.D.   On: 03/30/2020 08:50   NM PET Image Restag (PS) Skull Base To Thigh  Result Date: 03/28/2020 CLINICAL DATA:  Subsequent treatment strategy for breast cancer. EXAM: NUCLEAR MEDICINE PET SKULL BASE TO THIGH TECHNIQUE: 10.2 mCi F-18 FDG was injected intravenously. Full-ring PET imaging was performed from the skull base to thigh after the radiotracer. CT data was obtained and used for attenuation correction and anatomic localization. Fasting blood glucose: 47 mg/dl COMPARISON:  12/12/2019. FINDINGS: Mediastinal blood pool activity: SUV max 1.9 Liver activity: SUV max NA NECK: No abnormal hypermetabolism. Incidental CT findings: None. CHEST: New and pre-existing hypermetabolic pleural/subpleural nodules are seen bilaterally. Index 1.4 x 1.8 cm juxta mediastinal subpleural right upper lobe nodule has an SUV max of 7.1, compared to 3.2 previously. 10 mm subcarinal lymph node has an SUV max of 3.8. No hypermetabolic axillary adenopathy. Lateral left breast mass measures similar, 1.8 x 3.3 cm with an SUV max 4.4, compared to 3.4 previously. Incidental CT findings: Right IJ  Port-A-Cath terminates in the SVC. Atherosclerotic calcification of the aorta. Heart is enlarged. No pericardial effusion. Moderate right pleural effusion and small left pleural effusion, increased, with associated pleural thickening. ABDOMEN/PELVIS: New hypermetabolism in both adrenal glands, with an index SUV mass on the right of 4.4. The CT appearance of the adrenal glands is unchanged from 12/12/2019, however. No abnormal hypermetabolism in the liver, spleen or pancreas. Left external iliac lymph nodes are mildly hypermetabolic. Index 7 mm left external iliac lymph node (3/217) has increased slightly in size from 4 mm on 12/12/2019 and has an SUV max of 2.8. Uptake associated with soft tissue thickening along the right paramidline ventral abdominal wall may be postoperative in etiology. Incidental CT findings: Liver is unremarkable. Cholecystectomy. Adrenal glands are otherwise unremarkable. 1.4 cm low-attenuation lesion off the right kidney is likely a cyst although definitive characterization is limited postcontrast imaging. Kidneys, spleen, pancreas, stomach and bowel are otherwise grossly unremarkable. SKELETON: Treated sclerotic lesions are seen. For example, an index lesion in the right acetabulum does not show abnormal hypermetabolism. There are new foci of hypermetabolism within the spine. Index 5 mm lytic lesion in the T10 vertebral body (3/117) has an SUV max of 4.8. Incidental CT findings: Degenerative changes in the spine. IMPRESSION: 1. Progressive pleuroparenchymal and osseous metastatic disease. 2. New hypermetabolic lymph nodes in the subcarinal station and left external iliac chain. 3. Similar hypermetabolic primary left breast mass. 4. New hypermetabolism within the adrenal glands, without definite CT correlate. Continued attention on follow-up exams is warranted. 5. Moderate right and small left pleural effusions, increased, with associated pleural thickening. 6.  Aortic atherosclerosis  (ICD10-I70.0). Electronically Signed   By: Lorin Picket M.D.   On: 03/28/2020 10:51   DG Foot Complete Left  Result Date: 03/29/2020 CLINICAL DATA:  Left foot wound, initial encounter EXAM: LEFT FOOT - COMPLETE 3+ VIEW COMPARISON:  None. FINDINGS: Considerable soft tissue swelling is noted in the distal aspect of the foot. Some subcutaneous air is noted about the fifth MTP joint. These changes correspond to the given clinical history of localized infection. No definitive fracture or bony erosive changes are noted. No other focal abnormality is seen. IMPRESSION: Soft tissue swelling and subcutaneous air consistent with localized infection predominately about the fifth MTP joint. No definitive erosive changes are noted. If clinically indicated MRI may be helpful for further evaluation. Electronically Signed   By: Inez Catalina M.D.   On: 03/29/2020 15:56   DG  MINI C-ARM IMAGE ONLY  Result Date: 03/30/2020 There is no interpretation for this exam.  This order is for images obtained during a surgical procedure.  Please See "Surgeries" Tab for more information regarding the procedure.   ECHOCARDIOGRAM COMPLETE  Result Date: 03/30/2020    ECHOCARDIOGRAM REPORT   Patient Name:   [name] Date of Exam: 03/30/2020 Medical Rec #:  [id number]      Height:       62.0 in Accession #:    [id number]     Weight:       194.0 lb Date of Birth:  08-28-1955       BSA:          1.887 m Patient Age:    89 years       BP:           129/76 mmHg Patient Gender: F              HR:           88 bpm. Exam Location:  [facility] Procedure: 2D Echo, Cardiac Doppler and Color Doppler Indications:     Fever 780.6  History:         Patient has no prior history of Echocardiogram examinations.                  CHF, Stroke, Arrythmias:PAC; Risk Factors:Hypertension.  Sonographer:     Sherrie Sport RDCS (AE) Referring Phys:  [place] Diagnosing Phys: Serafina Royals MD IMPRESSIONS  1. Left ventricular ejection fraction, by estimation,  is 55 to 60%. The left ventricle has normal function. The left ventricle has no regional wall motion abnormalities. Left ventricular diastolic parameters were normal.  2. Right ventricular systolic function is normal. The right ventricular size is normal. There is normal pulmonary artery systolic pressure.  3. Left atrial size was mildly dilated.  4. The mitral valve is normal in structure. Mild to moderate mitral valve regurgitation.  5. The aortic valve is normal in structure. Aortic valve regurgitation is not visualized. FINDINGS  Left Ventricle: Left ventricular ejection fraction, by estimation, is 55 to 60%. The left ventricle has normal function. The left ventricle has no regional wall motion abnormalities. The left ventricular internal cavity size was normal in size. There is  no left ventricular hypertrophy. Left ventricular diastolic parameters were normal. Right Ventricle: The right ventricular size is normal. No increase in right ventricular wall thickness. Right ventricular systolic function is normal. There is normal pulmonary artery systolic pressure. The tricuspid regurgitant velocity is 2.42 m/s, and  with an assumed right atrial pressure of 10 mmHg, the estimated right ventricular systolic pressure is 66.2 mmHg. Left Atrium: Left atrial size was mildly dilated. Right Atrium: Right atrial size was normal in size. Pericardium: There is no evidence of pericardial effusion. Mitral Valve: The mitral valve is normal in structure. Mild to moderate mitral valve regurgitation. Tricuspid Valve: The tricuspid valve is normal in structure. Tricuspid valve regurgitation is trivial. Aortic Valve: The aortic valve is normal in structure. Aortic valve regurgitation is not visualized. Aortic valve mean gradient measures 6.0 mmHg. Aortic valve peak gradient measures 10.4 mmHg. Aortic valve area, by VTI measures 1.74 cm. Pulmonic Valve: The pulmonic valve was normal in structure. Pulmonic valve regurgitation is not  visualized. Aorta: The aortic root and ascending aorta are structurally normal, with no evidence of dilitation. IAS/Shunts: No atrial level shunt detected by color flow Doppler.  LEFT VENTRICLE PLAX 2D LVIDd:  4.30 cm  Diastology LVIDs:         2.79 cm  LV e' lateral:   7.51 cm/s LV PW:         1.43 cm  LV E/e' lateral: 14.1 LV IVS:        1.17 cm  LV e' medial:    6.31 cm/s LVOT diam:     2.00 cm  LV E/e' medial:  16.8 LV SV:         58 LV SV Index:   31 LVOT Area:     3.14 cm  RIGHT VENTRICLE RV Basal diam:  2.73 cm RV S prime:     15.20 cm/s TAPSE (M-mode): 3.9 cm LEFT ATRIUM             Index       RIGHT ATRIUM           Index LA diam:        4.30 cm 2.28 cm/m  RA Area:     16.00 cm LA Vol (A2C):   65.2 ml 34.55 ml/m RA Volume:   41.00 ml  21.73 ml/m LA Vol (A4C):   42.3 ml 22.42 ml/m LA Biplane Vol: 55.4 ml 29.36 ml/m  AORTIC VALVE                    PULMONIC VALVE AV Area (Vmax):    1.67 cm     PV Vmax:        0.71 m/s AV Area (Vmean):   1.59 cm     PV Peak grad:   2.0 mmHg AV Area (VTI):     1.74 cm     RVOT Peak grad: 3 mmHg AV Vmax:           161.00 cm/s AV Vmean:          114.333 cm/s AV VTI:            0.337 m AV Peak Grad:      10.4 mmHg AV Mean Grad:      6.0 mmHg LVOT Vmax:         85.70 cm/s LVOT Vmean:        57.800 cm/s LVOT VTI:          0.186 m LVOT/AV VTI ratio: 0.55  AORTA Ao Root diam: 2.70 cm MITRAL VALVE                TRICUSPID VALVE MV Area (PHT): 5.66 cm     TR Peak grad:   23.4 mmHg MV Decel Time: 134 msec     TR Vmax:        242.00 cm/s MV E velocity: 106.00 cm/s MV A velocity: 150.00 cm/s  SHUNTS MV E/A ratio:  0.71         Systemic VTI:  0.19 m                             Systemic Diam: 2.00 cm Serafina Royals MD Electronically signed by Serafina Royals MD Signature Date/Time: 03/30/2020/1:20:11 PM    Final     Assessment:   [name] is a 64 y.o. 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 Recent otpt cx MRSA. Current cx pending.  S/p 5th MT ray amp 8/27.   Recommendations Vanco for known MRSA - monitor cr and if worsens change to daptomycin Add cefepime and flagyl. FU bone cx and  margins   Thank you very much for allowing me to participate in the care of this patient. Please call with questions.   Cheral Marker. Ola Spurr, MD

## 2020-03-30 NOTE — Progress Notes (Deleted)
Pharmacy Antibiotic Note  [name] is a 64 y.o. female admitted on 03/29/2020 with sepsis.  Pharmacy has been consulted for Cefepime and vancomycin dosing. Patient has PMH of CKD stage V, DM,  CHF, and metastatic breast cancer. Tmax: 101.9, WBC: 25.2. Patient received Vancomycin 1500mg  IV and Cefepime 2g IV in ED.  Per podiatry, necrotic diabetic L foot ulcer, gas on x-ray of fifth MTPJ; planning for I&D and likely 5th ray amputation this AM (8/27)  Day 2 IV abx, WBC improving, currently afebrile but has had fever in past 24 hr (101.83F).  No significant changes in renal function.  Plan:  Will continue vancomycin 1g IV every 24 hours based on [place] Vancomycin Nomogram.   Continue to monitor renal function and obtain vanc levels as clinically indicated   Continue Cefepime 2g IV every 24 hours   Height: 5\' 2"  (157.5 cm) Weight: 88 kg (194 lb) IBW/kg (Calculated) : 50.1  Temp (24hrs), Avg:99.5 F (37.5 C), Min:98.3 F (36.8 C), Max:101.9 F (38.8 C)  Recent Labs  Lab 03/29/20 1127 03/29/20 1551 03/30/20 0453  WBC 25.2*  --  21.2*  CREATININE 2.74*  --  2.53*  LATICACIDVEN 2.1* 1.4  --     Estimated Creatinine Clearance: 23.5 mL/min (A) (by C-G formula based on SCr of 2.53 mg/dL (H)).    Allergies  Allergen Reactions  . Fish-Derived Products   . Sulfamethoxazole-Trimethoprim Other (See Comments)    Antimicrobials this admission: 8/26 Cefepime >>  8/26 Vancomycin  >>   Microbiology results: 8/26 BCx: NG <24hrs  Thank you for allowing pharmacy to be a part of this patient's care.  Sherilyn Banker, PharmD Pharmacy Resident  03/30/2020 9:17 AM

## 2020-03-30 NOTE — Progress Notes (Signed)
Pharmacy Antibiotic Note  [name] is a 64 y.o. female admitted on 03/29/2020 with MRSA cellulitis pending I&D.  Pharmacy has been consulted for  vancomycin dosing. Patient has PMH of CKD stage V, DM,  CHF, and metastatic breast cancer. Since admission her renal function has improved but is not yet back to her baseline SCr of approximately 2.1 mg/dL, WBC down slightly but still febrile overnight.  Plan:   adjust vancomycin to 750 mg IV every 48 hours  T1/2: 35.8 h, K: 0.019 h-1  Css (calculated): 28.2/11.4 mcg/mL  Daily SCr while on vancomycin to assess renal function  vancomycin levels when clinically appropriate  Height: 5\' 2"  (157.5 cm) Weight: 88 kg (194 lb) IBW/kg (Calculated) : 50.1  Temp (24hrs), Avg:99.5 F (37.5 C), Min:98.3 F (36.8 C), Max:101.9 F (38.8 C)  Recent Labs  Lab 03/29/20 1127 03/29/20 1551 03/30/20 0453  WBC 25.2*  --  21.2*  CREATININE 2.74*  --  2.53*  LATICACIDVEN 2.1* 1.4  --     Estimated Creatinine Clearance: 23.5 mL/min (A) (by C-G formula based on SCr of 2.53 mg/dL (H)).    Allergies  Allergen Reactions  . Fish-Derived Products   . Sulfamethoxazole-Trimethoprim Other (See Comments)    Antimicrobials this admission: 8/26 Cefepime >> 8/27 8/26 Vancomycin  >>   Microbiology results: 8/23 WCx: MRSA 8/27 WCx: pending 8/26 BCx: NG<24h 8/26 SARS CoV-2: negative  Thank you for allowing pharmacy to be a part of this patient's care.  Dallie Piles, PharmD, BCPS Clinical Pharmacist 03/30/2020 7:36 AM

## 2020-03-30 NOTE — Transfer of Care (Signed)
Immediate Anesthesia Transfer of Care Note  Patient: [name]  Procedure(s) Performed: IRRIGATION AND DEBRIDEMENT FOOT (Left Foot) AMPUTATION 5th RAY (Left Foot) APPLICATION OF WOUND VAC (Left Foot)  Patient Location: PACU  Anesthesia Type:General  Level of Consciousness: awake and patient cooperative  Airway & Oxygen Therapy: Patient Spontanous Breathing and Patient connected to face mask oxygen  Post-op Assessment: Report given to RN and Post -op Vital signs reviewed and stable  Post vital signs: Reviewed and stable  Last Vitals:  Vitals Value Taken Time  BP 108/76 03/30/20 1345  Temp    Pulse 72 03/30/20 1348  Resp 16 03/30/20 1347  SpO2 97 % 03/30/20 1348  Vitals shown include unvalidated device data.  Last Pain:  Vitals:   03/30/20 1102  TempSrc: Oral  PainSc:          Complications: No complications documented.

## 2020-03-30 NOTE — Anesthesia Preprocedure Evaluation (Addendum)
Anesthesia Evaluation  Patient identified by MRN, date of birth, ID band  Airway Mallampati: III  TM Distance: >3 FB     Dental   Pulmonary asthma , former smoker,    Pulmonary exam normal        Cardiovascular hypertension, +CHF  Normal cardiovascular exam     Neuro/Psych PSYCHIATRIC DISORDERS Anxiety Depression  Neuromuscular disease CVA    GI/Hepatic GERD  ,  Endo/Other  diabetes  Renal/GU Renal InsufficiencyRenal disease  negative genitourinary   Musculoskeletal   Abdominal Normal abdominal exam  (+)   Peds  Hematology  (+) anemia ,   Anesthesia Other Findings   Reproductive/Obstetrics                            Anesthesia Physical Anesthesia Plan  ASA: III  Anesthesia Plan: General   Post-op Pain Management:    Induction: Intravenous, Rapid sequence and Cricoid pressure planned  PONV Risk Score and Plan:   Airway Management Planned: Oral ETT  Additional Equipment:   Intra-op Plan:   Post-operative Plan: Extubation in OR  Informed Consent: I have reviewed the patients History and Physical, chart, labs and discussed the procedure including the risks, benefits and alternatives for the proposed anesthesia with the patient or authorized representative who has indicated his/her understanding and acceptance.     Dental advisory given  Plan Discussed with: CRNA and Surgeon  Anesthesia Plan Comments:         Anesthesia Quick Evaluation                                  Anesthesia Evaluation  Patient identified by MRN, date of birth, ID band Patient awake    Reviewed: Allergy & Precautions, H&P , NPO status , reviewed documented beta blocker date and time   Airway Mallampati: III  TM Distance: >3 FB Neck ROM: full    Dental  (+) Teeth Intact, Caps   Pulmonary asthma , former smoker,    Pulmonary exam normal        Cardiovascular hypertension, +CHF   Normal cardiovascular exam     Neuro/Psych PSYCHIATRIC DISORDERS Anxiety Depression  Neuromuscular disease CVA    GI/Hepatic GERD  Medicated and Controlled,  Endo/Other  diabetesMorbid obesity  Renal/GU Renal disease     Musculoskeletal   Abdominal   Peds  Hematology  (+) Blood dyscrasia, anemia ,   Anesthesia Other Findings Past Medical History: No date: Anemia No date: Anxiety No date: Asthma 03/10/2018: Cancer ([place])     Comment:  Per NM PET order. Carcinoma of upper-inner quadrant of               left breast in female, estrogen receptor positive . No date: Cancer [facility])     Comment:  LUNG 1997: CHF (congestive heart failure) ([place]) No date: CKD (chronic kidney disease) No date: Depression No date: Diabetes mellitus, type 2 (HCC) No date: Family history of breast cancer No date: Family history of colon cancer No date: Family history of ovarian cancer No date: Family history of pancreatic cancer No date: Family history of prostate cancer No date: Family history of stomach cancer No date: GERD (gastroesophageal reflux disease)     Comment:  history of an ulcer No date: Hair loss 05/29/14: History of left breast cancer 12/31/2016: History of partial hysterectomy     Comment:  Per patient.  Has not had a period in years.  Had a               partial hysterectomy years ago. No date: Hypertension No date: Mitral valve regurgitation No date: Neuromuscular disorder ([place])     Comment:  neuropathies in hand No date: Obesity 1997: Pancreatitis 2010: Stroke [facility])     Comment:  with mild left arm weakness  Past Surgical History: 02/24/2019: CATARACT EXTRACTION W/PHACO; Right     Comment:  Procedure: CATARACT EXTRACTION PHACO AND INTRAOCULAR               LENS PLACEMENT ([place]) RIGHT DIABETES;  Surgeon: Marchia Meiers, MD;  Location: [facility] ORS;  Service: Ophthalmology;               Laterality: Right;  [place] 01:13.0 CDE 7.96 Fluid Pack Lot               #  [id number] No date: CESAREAN SECTION No date: CHOLECYSTECTOMY 08/17/2018: EXCISION OF TONGUE LESION; N/A     Comment:  Procedure: EXCISION OF TONGUE LESION WITH FROZEN               SECTIONS;  Surgeon: Beverly Gust, MD;  Location: [facility]              ORS;  Service: ENT;  Laterality: N/A; No date: EYE SURGERY; Right     Comment:  cataract extraction 12/31/2016: PARTIAL HYSTERECTOMY     Comment:  Per patient, she has not had a period in years since she              had a partial hysterectomy. No date: PORTA CATH INSERTION No date: TUBAL LIGATION  BMI    Body Mass Index: 39.05 kg/m      Reproductive/Obstetrics                             Anesthesia Physical Anesthesia Plan  ASA: III  Anesthesia Plan: MAC   Post-op Pain Management:    Induction: Intravenous  PONV Risk Score and Plan: 2 and TIVA and Midazolam  Airway Management Planned: Nasal Cannula and Natural Airway  Additional Equipment:   Intra-op Plan:   Post-operative Plan:   Informed Consent: I have reviewed the patients History and Physical, chart, labs and discussed the procedure including the risks, benefits and alternatives for the proposed anesthesia with the patient or authorized representative who has indicated his/her understanding and acceptance.     Dental Advisory Given  Plan Discussed with: CRNA  Anesthesia Plan Comments:         Anesthesia Quick Evaluation

## 2020-03-30 NOTE — ED Notes (Signed)
Hourly rounding reveals patient in room sitting on side of bed using bedpan, patient needs one man assist to get back to bed. No complaints, stable, in no acute distress.

## 2020-03-30 NOTE — Progress Notes (Addendum)
PROGRESS NOTE    [name]  BDZ:[id number] DOB: October 13, 1955 DOA: 03/29/2020 PCP: [name], MD   Brief Narrative: [name] is a 64 y.o. female with medical history significant of metastatic breast cancer, diabetes mellitus, CKD stage III, CHF, presents in the emergency department with open draining left foot wound and associated fever.  Patient reports having this wound for a while,  she has recently established care with the wound clinic but it has recently gotten worse and started draining.  She reports seeing her primary care physician and was started on Keflex and then which was recently switched to Bactrim but she is not seeing any improvement.  She reports having fever,  associated chills and body ache.  she denies any shortness of breath,  cough. 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  ED Course: She was febrile with a fever of 101.9, heart rate 92, respiratory rate 18, blood pressure 134 / 75, O2 saturation 95% on room air. Labs: CMP: Sodium 133 potassium 4.8, chloride 100, bicarb 22, BUN 37, creatinine 2.74, glucose 381 , calcium 8.5, WBC 25.2 hemoglobin 8.6, hematocrit 26.1, platelet 359, LFTs slightly elevated, lactic acid 2.1. CXR: Stable nodular opacity in the lingula measuring 1.4 x 1.3 cm. Bilateral pleural effusions with bibasilar atelectasis.  Heart upper normal in size. Port-A-Cath tip in superior vena cava.  Xray Left foot: Soft tissue swelling and subcutaneous air consistent with localized infection predominately about the fifth MTP joint.  No definitive erosive changes are noted. If clinically indicated MRI may be helpful for further evaluation.  Patient was admitted for sepsis secondary to infected left foot wound,  MRI consistent with osteomyelitis.  Podiatry consulted,  Patient underwent fifth ray amputation,  tolerated well.  Patient is continued on IV antibiotics,   Podiatry  is following , wound care needs to be arranged.  Infectious diseases consulted to to guide antibiotic regimen.  Assessment & Plan:   Principal Problem:   Sepsis ([place]) Active Problems:   Asthma   Benign essential HTN   Chronic kidney disease (CKD), stage V (HCC)   Chronic systolic CHF (congestive heart failure) (HCC)   Depression with anxiety   Type 2 diabetes mellitus with diabetic chronic kidney disease (HCC)   MI (mitral incompetence)   Carcinoma of upper-inner quadrant of left breast in female, estrogen receptor positive ([place])   Metastasis from malignant tumor of breast (HCC)   Anemia of chronic disease   Sepsis secondary to infected Left foot wound: POA: temp 101.9  HR 92 RR 18 BP 134/75 lactic acid 2.1.  Leukocytosis 25.5  Source foot infection. Start sepsis order set. She received IV fluids in the ED, Lactic acid normalized. Given Vanco, cefepime and Flagyl in the ED. We will continue vancomycin and cefepime,  pharmacy consulted. Adequate pain control with oxycodone as needed. Continue gentle hydration. Podiatry consulted,  MRI consistent with osteomyelitis. Patient underwent fifth ray amputation tolerated well. Infectious disease consulted to guide about antibiotic regimen. Wound care consult.  AKI on CKD stage III:  Baseline creatinine remains around 2.1.   Avoid nephrotoxic medications, continue gentle hydration,  recheck labs tomorrow morning.  Anemia of chronic disease: No obvious bleeding, hemoglobin is stable.  Hypertension:  Continue home medications once medical reconciliation complete.  Diabetes mellitus Continue Lantus 55 units at night. Sliding scale,  obtain hemoglobin A1c.  Elevated LFTs:  Could be due to metastatic lesion.  Metastatic breast cancer; She follows  up outpatient with oncologist, consider up oncology consult.  Depression with anxiety: Resume home meds   DVT prophylaxis:  Lovenox Code Status: Full Family  Communication: Discussed with patient,  Nobody at bedside. Disposition Plan: Anticipated discharge home once medically clear. Consults called: Podiatrist    Consultants:    Podiatry   Procedures:  Partial 5th ray amputation left foot Antimicrobials: Anti-infectives (From admission, onward)   Start     Dose/Rate Route Frequency Ordered Stop   03/31/20 1800  vancomycin (VANCOREADY) IVPB 750 mg/150 mL        750 mg 150 mL/hr over 60 Minutes Intravenous Every 48 hours 03/30/20 1327     03/30/20 1800  vancomycin (VANCOCIN) IVPB 1000 mg/200 mL premix  Status:  Discontinued        1,000 mg 200 mL/hr over 60 Minutes Intravenous Every 24 hours 03/29/20 1735 03/30/20 1327   03/30/20 1400  ceFEPIme (MAXIPIME) 2 g in sodium chloride 0.9 % 100 mL IVPB  Status:  Discontinued        2 g 200 mL/hr over 30 Minutes Intravenous Every 24 hours 03/29/20 1735 03/30/20 1323   03/29/20 1700  vancomycin (VANCOCIN) IVPB 1000 mg/200 mL premix  Status:  Discontinued        1,000 mg 200 mL/hr over 60 Minutes Intravenous  Once 03/29/20 1658 03/29/20 1700   03/29/20 1630  vancomycin (VANCOREADY) IVPB 500 mg/100 mL        500 mg 100 mL/hr over 60 Minutes Intravenous  Once 03/29/20 1541 03/29/20 1834   03/29/20 1545  metroNIDAZOLE (FLAGYL) IVPB 500 mg  Status:  Discontinued        500 mg 100 mL/hr over 60 Minutes Intravenous Every 8 hours 03/29/20 1534 03/29/20 1714   03/29/20 1530  ceFEPIme (MAXIPIME) 2 g in sodium chloride 0.9 % 100 mL IVPB        2 g 200 mL/hr over 30 Minutes Intravenous  Once 03/29/20 1522 03/29/20 1631   03/29/20 1530  vancomycin (VANCOCIN) IVPB 1000 mg/200 mL premix        1,000 mg 200 mL/hr over 60 Minutes Intravenous  Once 03/29/20 1522 03/29/20 1834      Subjective: Patient was seen and examined at bedside.  No overnight events.  Patient underwent partial fifth ray amputation left foot tolerated well.  She reports in a lot of pain. Objective: Vitals:   03/30/20 1430 03/30/20  1501 03/30/20 1530 03/30/20 1628  BP: 105/67 (!) 107/57 112/80 112/69  Pulse: 70 72 70 72  Resp: 15 20 16 16   Temp: 97.9 F (36.6 C) 99 F (37.2 C) 98.4 F (36.9 C) 97.8 F (36.6 C)  TempSrc:  Oral Oral Oral  SpO2: 96% 95% 96% 96%  Weight:      Height:        Intake/Output Summary (Last 24 hours) at 03/30/2020 1630 Last data filed at 03/30/2020 1349 Gross per 24 hour  Intake 600 ml  Output 20 ml  Net 580 ml   Filed Weights   03/29/20 1119  Weight: 88 kg    Examination:  General exam: Appears calm and comfortable  Respiratory system: Clear to auscultation. Respiratory effort normal. Cardiovascular system: S1 & S2 heard, RRR. No JVD, murmurs, rubs, gallops or clicks. No pedal edema. Gastrointestinal system: Abdomen is nondistended, soft and nontender. No organomegaly or masses felt. Normal bowel sounds heard. Central nervous system: Alert and oriented. No focal neurological deficits. Extremities:  Left partial toe amputation. Skin: No rashes, lesions or  ulcers Psychiatry: Judgement and insight appear normal. Mood & affect appropriate.     Data Reviewed: I have personally reviewed following labs and imaging studies  CBC: Recent Labs  Lab 03/29/20 1127 03/30/20 0453  WBC 25.2* 21.2*  NEUTROABS 23.2*  --   HGB 8.6* 7.3*  HCT 26.1* 22.0*  MCV 91.3 90.9  PLT 359 892   Basic Metabolic Panel: Recent Labs  Lab 03/29/20 1127 03/30/20 0453  NA 133* 137  K 4.8 4.5  CL 100 105  CO2 22 23  GLUCOSE 381* 160*  BUN 37* 36*  CREATININE 2.74* 2.53*  CALCIUM 8.5* 8.5*   GFR: Estimated Creatinine Clearance: 23.5 mL/min (A) (by C-G formula based on SCr of 2.53 mg/dL (H)). Liver Function Tests: Recent Labs  Lab 03/29/20 1127 03/30/20 0453  AST 78* 30  ALT 55* 38  ALKPHOS 94 77  BILITOT 0.8 1.0  PROT 7.7 6.9  ALBUMIN 3.0* 2.6*   No results for input(s): LIPASE, AMYLASE in the last 168 hours. No results for input(s): AMMONIA in the last 168 hours. Coagulation  Profile: Recent Labs  Lab 03/30/20 0453  INR 1.3*   Cardiac Enzymes: No results for input(s): CKTOTAL, CKMB, CKMBINDEX, TROPONINI in the last 168 hours. BNP (last 3 results) No results for input(s): PROBNP in the last 8760 hours. HbA1C: No results for input(s): HGBA1C in the last 72 hours. CBG: Recent Labs  Lab 03/28/20 0809 03/28/20 0811 03/30/20 1104 03/30/20 1347 03/30/20 1626  GLUCAP 47* 45* 130* 121* 120*   Lipid Profile: No results for input(s): CHOL, HDL, LDLCALC, TRIG, CHOLHDL, LDLDIRECT in the last 72 hours. Thyroid Function Tests: No results for input(s): TSH, T4TOTAL, FREET4, T3FREE, THYROIDAB in the last 72 hours. Anemia Panel: No results for input(s): VITAMINB12, FOLATE, FERRITIN, TIBC, IRON, RETICCTPCT in the last 72 hours. Sepsis Labs: Recent Labs  Lab 03/29/20 1127 03/29/20 1551 03/30/20 0453  PROCALCITON  --   --  6.02  LATICACIDVEN 2.1* 1.4  --     Recent Results (from the past 240 hour(s))  Aerobic Culture (superficial specimen)     Status: None   Collection Time: 03/26/20  3:10 PM   Specimen: Wound  Result Value Ref Range Status   Specimen Description   Final    WOUND Performed at [facility], [address], [place], [place] [address]    Special Requests   Final    LEFT FOOT Performed at [facility], [address] [place], [place] [address]    Gram Stain   Final    NO WBC SEEN MODERATE GRAM POSITIVE COCCI FEW GRAM NEGATIVE RODS    Culture   Final    FEW METHICILLIN RESISTANT STAPHYLOCOCCUS AUREUS MIXED WITHIN OTHER ORGANISMS Performed at [facility], [place] [address], [place], [place] [address]    Report Status 03/29/2020 FINAL  Final   Organism ID, Bacteria METHICILLIN RESISTANT STAPHYLOCOCCUS AUREUS  Final      Susceptibility   Methicillin resistant staphylococcus aureus - MIC*    CIPROFLOXACIN >=8 RESISTANT Resistant     ERYTHROMYCIN >=8 RESISTANT Resistant     GENTAMICIN <=0.5 SENSITIVE Sensitive      OXACILLIN >=4 RESISTANT Resistant     TETRACYCLINE <=1 SENSITIVE Sensitive     VANCOMYCIN 1 SENSITIVE Sensitive     TRIMETH/SULFA <=10 SENSITIVE Sensitive     CLINDAMYCIN 1 INTERMEDIATE Intermediate     RIFAMPIN 16 RESISTANT Resistant     Inducible Clindamycin NEGATIVE Sensitive     *  FEW METHICILLIN RESISTANT STAPHYLOCOCCUS AUREUS  Blood culture (routine x 2)     Status: None (Preliminary result)   Collection Time: 03/29/20  3:51 PM   Specimen: BLOOD  Result Value Ref Range Status   Specimen Description BLOOD BLOOD RIGHT HAND  Final   Special Requests   Final    BOTTLES DRAWN AEROBIC AND ANAEROBIC Blood Culture adequate volume   Culture   Final    NO GROWTH < 24 HOURS Performed at [facility], [address] [place], [place] [address]    Report Status PENDING  Incomplete  Blood culture (routine x 2)     Status: None (Preliminary result)   Collection Time: 03/29/20  3:51 PM   Specimen: BLOOD  Result Value Ref Range Status   Specimen Description BLOOD LEFT ANTECUBITAL  Final   Special Requests   Final    BOTTLES DRAWN AEROBIC AND ANAEROBIC Blood Culture adequate volume   Culture   Final    NO GROWTH < 24 HOURS Performed at [facility], [address], [place], [place] [address]    Report Status PENDING  Incomplete  SARS Coronavirus 2 by RT PCR (hospital order, performed in [place] hospital lab) Nasopharyngeal Nasopharyngeal Swab     Status: None   Collection Time: 03/29/20  5:15 PM   Specimen: Nasopharyngeal Swab  Result Value Ref Range Status   SARS Coronavirus 2 NEGATIVE NEGATIVE Final    Comment: (NOTE) SARS-CoV-2 target nucleic acids are NOT DETECTED.  The SARS-CoV-2 RNA is generally detectable in upper and lower respiratory specimens during the acute phase of infection. The lowest concentration of SARS-CoV-2 viral copies this assay can detect is 250 copies / mL. A negative result does not preclude SARS-CoV-2 infection and should not be  used as the sole basis for treatment or other patient management decisions.  A negative result may occur with improper specimen collection / handling, submission of specimen other than nasopharyngeal swab, presence of viral mutation(s) within the areas targeted by this assay, and inadequate number of viral copies (<250 copies / mL). A negative result must be combined with clinical observations, patient history, and epidemiological information.  Fact Sheet for Patients:   StrictlyIdeas.no  Fact Sheet for Healthcare Providers: BankingDealers.co.za  This test is not yet approved or  cleared by the [place] FDA and has been authorized for detection and/or diagnosis of SARS-CoV-2 by FDA under an Emergency Use Authorization (EUA).  This EUA will remain in effect (meaning this test can be used) for the duration of the COVID-19 declaration under Section 564(b)(1) of the Act, 21 U.S.C. section 360bbb-3(b)(1), unless the authorization is terminated or revoked sooner.  Performed at [facility], [address] [place], [place] [address]          Radiology Studies: DG Chest 2 View  Result Date: 03/29/2020 CLINICAL DATA:  Shortness of breath and dizziness.  Breast carcinoma EXAM: CHEST - 2 VIEW COMPARISON:  Chest radiograph September 27, 2019; PET-CT March 28, 2020 FINDINGS: There are pleural effusions bilaterally with bibasilar atelectatic change. The masslike area in the lingula seen on recent PET study is appreciable by radiography measuring 1.4 x 1.3 cm. Lungs elsewhere are clear. Heart is upper normal in size with pulmonary vascularity normal. Port-A-Cath tip is in the superior vena cava. No adenopathy is evident by radiography. IMPRESSION: Stable nodular opacity in the lingula measuring 1.4 x 1.3 cm. Bilateral pleural effusions with bibasilar atelectasis. No new opacity evident compared to recent PET  study. Heart upper normal in  size.  Port-A-Cath tip in superior vena cava. Electronically Signed   By: Lowella Grip III M.D.   On: 03/29/2020 12:33   MR FOOT LEFT WO CONTRAST  Result Date: 03/30/2020 CLINICAL DATA:  Foot swelling, diabetes, possible osteomyelitis. Gas in the soft tissues at the base of the small toe. EXAM: MRI OF THE LEFT FOOT WITHOUT CONTRAST TECHNIQUE: Multiplanar, multisequence MR imaging of the left forefoot was performed. No intravenous contrast was administered. COMPARISON:  Radiographs 03/29/2020 FINDINGS: Bones/Joint/Cartilage Abnormal edema, cortical destruction/demineralization, and reduced T1 signal in the distal metaphysis and head of the fifth metatarsal and in the proximal phalanx of the small toe (especially proximally) compatible with osteomyelitis. Septic joint is a distinct possibility although the lack of a substantial joint effusion may indicate that the joint is draining to the plantar skin surface. There is synovitis in the fifth MTP joint as well as gas along the margins of the joint capsule. No other regions of osteomyelitis identified. Ligaments Lisfranc ligament intact. Muscles and Tendons Diffuse edema within and along regional musculature which could be neurogenic or due to myositis. Soft tissues Lateral and plantar to the fifth MTP joint there is abnormal gas, suspected cutaneous ulceration, and extensive inflammation. Some tiny amount of metal artifact in this vicinity possibly from bandaging, there is no visible metal foreign body on recent radiographs. Cannot exclude draining sinus tract to the skin from the fifth MTP joint. Do not see a readily drainable abscess although there is extensive subcutaneous edema tracking in the foot, especially dorsally, and into the toes. Cellulitis is a distinct possibility. IMPRESSION: 1. Osteomyelitis of the distal metaphysis and head of the fifth metatarsal and in the proximal phalanx of the small toe. 2. Fifth MTP septic joint is a distinct  possibility although the lack of a substantial joint effusion may indicate that the joint is draining to the plantar or lateral surfaces. Loculations of gas in the soft tissues along the margins of the fifth MTP joint. 3. Extensive subcutaneous edema tracking in the foot, especially dorsally, and into the toes. Cellulitis is a distinct possibility. 4. Diffuse edema within and along regional musculature which could be neurogenic or due to myositis. Electronically Signed   By: Van Clines M.D.   On: 03/30/2020 08:50   DG Foot Complete Left  Result Date: 03/29/2020 CLINICAL DATA:  Left foot wound, initial encounter EXAM: LEFT FOOT - COMPLETE 3+ VIEW COMPARISON:  None. FINDINGS: Considerable soft tissue swelling is noted in the distal aspect of the foot. Some subcutaneous air is noted about the fifth MTP joint. These changes correspond to the given clinical history of localized infection. No definitive fracture or bony erosive changes are noted. No other focal abnormality is seen. IMPRESSION: Soft tissue swelling and subcutaneous air consistent with localized infection predominately about the fifth MTP joint. No definitive erosive changes are noted. If clinically indicated MRI may be helpful for further evaluation. Electronically Signed   By: Inez Catalina M.D.   On: 03/29/2020 15:56   DG MINI C-ARM IMAGE ONLY  Result Date: 03/30/2020 There is no interpretation for this exam.  This order is for images obtained during a surgical procedure.  Please See "Surgeries" Tab for more information regarding the procedure.   ECHOCARDIOGRAM COMPLETE  Result Date: 03/30/2020    ECHOCARDIOGRAM REPORT   Patient Name:   [name] Date of Exam: 03/30/2020 Medical Rec #:  [id number]      Height:  62.0 in Accession #:    [id number]     Weight:       194.0 lb Date of Birth:  1955/10/01       BSA:          1.887 m Patient Age:    77 years       BP:           129/76 mmHg Patient Gender: F              HR:           88  bpm. Exam Location:  [facility] Procedure: 2D Echo, Cardiac Doppler and Color Doppler Indications:     Fever 780.6  History:         Patient has no prior history of Echocardiogram examinations.                  CHF, Stroke, Arrythmias:PAC; Risk Factors:Hypertension.  Sonographer:     Sherrie Sport RDCS (AE) Referring Phys:  [place] Diagnosing Phys: Serafina Royals MD IMPRESSIONS  1. Left ventricular ejection fraction, by estimation, is 55 to 60%. The left ventricle has normal function. The left ventricle has no regional wall motion abnormalities. Left ventricular diastolic parameters were normal.  2. Right ventricular systolic function is normal. The right ventricular size is normal. There is normal pulmonary artery systolic pressure.  3. Left atrial size was mildly dilated.  4. The mitral valve is normal in structure. Mild to moderate mitral valve regurgitation.  5. The aortic valve is normal in structure. Aortic valve regurgitation is not visualized. FINDINGS  Left Ventricle: Left ventricular ejection fraction, by estimation, is 55 to 60%. The left ventricle has normal function. The left ventricle has no regional wall motion abnormalities. The left ventricular internal cavity size was normal in size. There is  no left ventricular hypertrophy. Left ventricular diastolic parameters were normal. Right Ventricle: The right ventricular size is normal. No increase in right ventricular wall thickness. Right ventricular systolic function is normal. There is normal pulmonary artery systolic pressure. The tricuspid regurgitant velocity is 2.42 m/s, and  with an assumed right atrial pressure of 10 mmHg, the estimated right ventricular systolic pressure is 52.8 mmHg. Left Atrium: Left atrial size was mildly dilated. Right Atrium: Right atrial size was normal in size. Pericardium: There is no evidence of pericardial effusion. Mitral Valve: The mitral valve is normal in structure. Mild to moderate mitral valve regurgitation.  Tricuspid Valve: The tricuspid valve is normal in structure. Tricuspid valve regurgitation is trivial. Aortic Valve: The aortic valve is normal in structure. Aortic valve regurgitation is not visualized. Aortic valve mean gradient measures 6.0 mmHg. Aortic valve peak gradient measures 10.4 mmHg. Aortic valve area, by VTI measures 1.74 cm. Pulmonic Valve: The pulmonic valve was normal in structure. Pulmonic valve regurgitation is not visualized. Aorta: The aortic root and ascending aorta are structurally normal, with no evidence of dilitation. IAS/Shunts: No atrial level shunt detected by color flow Doppler.  LEFT VENTRICLE PLAX 2D LVIDd:         4.30 cm  Diastology LVIDs:         2.79 cm  LV e' lateral:   7.51 cm/s LV PW:         1.43 cm  LV E/e' lateral: 14.1 LV IVS:        1.17 cm  LV e' medial:    6.31 cm/s LVOT diam:     2.00 cm  LV E/e' medial:  16.8  LV SV:         58 LV SV Index:   31 LVOT Area:     3.14 cm  RIGHT VENTRICLE RV Basal diam:  2.73 cm RV S prime:     15.20 cm/s TAPSE (M-mode): 3.9 cm LEFT ATRIUM             Index       RIGHT ATRIUM           Index LA diam:        4.30 cm 2.28 cm/m  RA Area:     16.00 cm LA Vol (A2C):   65.2 ml 34.55 ml/m RA Volume:   41.00 ml  21.73 ml/m LA Vol (A4C):   42.3 ml 22.42 ml/m LA Biplane Vol: 55.4 ml 29.36 ml/m  AORTIC VALVE                    PULMONIC VALVE AV Area (Vmax):    1.67 cm     PV Vmax:        0.71 m/s AV Area (Vmean):   1.59 cm     PV Peak grad:   2.0 mmHg AV Area (VTI):     1.74 cm     RVOT Peak grad: 3 mmHg AV Vmax:           161.00 cm/s AV Vmean:          114.333 cm/s AV VTI:            0.337 m AV Peak Grad:      10.4 mmHg AV Mean Grad:      6.0 mmHg LVOT Vmax:         85.70 cm/s LVOT Vmean:        57.800 cm/s LVOT VTI:          0.186 m LVOT/AV VTI ratio: 0.55  AORTA Ao Root diam: 2.70 cm MITRAL VALVE                TRICUSPID VALVE MV Area (PHT): 5.66 cm     TR Peak grad:   23.4 mmHg MV Decel Time: 134 msec     TR Vmax:        242.00 cm/s MV  E velocity: 106.00 cm/s MV A velocity: 150.00 cm/s  SHUNTS MV E/A ratio:  0.71         Systemic VTI:  0.19 m                             Systemic Diam: 2.00 cm Serafina Royals MD Electronically signed by Serafina Royals MD Signature Date/Time: 03/30/2020/1:20:11 PM    Final      Scheduled Meds: . amLODipine  10 mg Oral Daily  . aspirin EC  81 mg Oral Daily  . atenolol  100 mg Oral BID  . calcitRIOL  0.25 mcg Oral Daily  . cloNIDine  0.2 mg Oral BID  . docusate sodium  100 mg Oral BID  . enoxaparin (LOVENOX) injection  30 mg Subcutaneous Q24H  . famotidine  20 mg Oral BID  . ferrous sulfate  325 mg Oral BID WC  . FLUoxetine  20 mg Oral BID  . insulin aspart  0-5 Units Subcutaneous QHS  . insulin aspart  0-9 Units Subcutaneous TID WC  . insulin detemir  55 Units Subcutaneous Daily  . senna  1 tablet Oral BID  . simvastatin  20 mg Oral QPM  Continuous Infusions: . sodium chloride 222 mL/hr at 03/30/20 1156  . [START ON 03/31/2020] vancomycin       LOS: 1 day    Time spent: 25 mins.    Shawna Clamp, MD [facility]   If 7PM-7AM, please contact night-coverage

## 2020-03-30 NOTE — Anesthesia Procedure Notes (Signed)
Procedure Name: Intubation Performed by: Gaynelle Cage, CRNA Pre-anesthesia Checklist: Patient identified, Emergency Drugs available, Suction available and Patient being monitored Patient Re-evaluated:Patient Re-evaluated prior to induction Oxygen Delivery Method: Circle system utilized Preoxygenation: Pre-oxygenation with 100% oxygen Induction Type: IV induction, Rapid sequence and Cricoid Pressure applied Laryngoscope Size: Mac and 3 Grade View: Grade II Tube type: Oral Tube size: 7.0 mm Number of attempts: 1 Placement Confirmation: ETT inserted through vocal cords under direct vision,  positive ETCO2 and breath sounds checked- equal and bilateral Secured at: 21 cm Tube secured with: Tape Dental Injury: Teeth and Oropharynx as per pre-operative assessment

## 2020-03-30 NOTE — Progress Notes (Signed)
*  PRELIMINARY RESULTS* Echocardiogram 2D Echocardiogram has been performed.  Sherrie Sport 03/30/2020, 10:23 AM

## 2020-03-30 NOTE — ED Notes (Signed)
Attempted to call report to Clydie Braun, RN [facility]) [phone] she will return call when she finishes with a patient

## 2020-03-30 NOTE — Anesthesia Postprocedure Evaluation (Signed)
Anesthesia Post Note  Patient: [name]  Procedure(s) Performed: IRRIGATION AND DEBRIDEMENT FOOT (Left Foot) AMPUTATION 5th RAY (Left Foot) APPLICATION OF WOUND VAC (Left Foot)  Patient location during evaluation: PACU Anesthesia Type: General Level of consciousness: awake and alert and oriented Pain management: pain level controlled Vital Signs Assessment: post-procedure vital signs reviewed and stable Respiratory status: spontaneous breathing Cardiovascular status: blood pressure returned to baseline Anesthetic complications: no   No complications documented.   Last Vitals:  Vitals:   03/30/20 1501 03/30/20 1530  BP: (!) 107/57 112/80  Pulse: 72 70  Resp: 20 16  Temp: 37.2 C 36.9 C  SpO2: 95% 96%    Last Pain:  Vitals:   03/30/20 1530  TempSrc: Oral  PainSc:                  Jozie Wulf

## 2020-03-31 ENCOUNTER — Encounter: Payer: Self-pay | Admitting: Podiatry

## 2020-03-31 LAB — BASIC METABOLIC PANEL
Anion gap: 5 (ref 5–15)
BUN: 39 mg/dL — ABNORMAL HIGH (ref 8–23)
CO2: 24 mmol/L (ref 22–32)
Calcium: 8 mg/dL — ABNORMAL LOW (ref 8.9–10.3)
Chloride: 106 mmol/L (ref 98–111)
Creatinine, Ser: 2.43 mg/dL — ABNORMAL HIGH (ref 0.44–1.00)
GFR calc Af Amer: 24 mL/min — ABNORMAL LOW (ref >60)
GFR calc non Af Amer: 20 mL/min — ABNORMAL LOW (ref >60)
Glucose, Bld: 347 mg/dL — ABNORMAL HIGH (ref 70–99)
Potassium: 5.1 mmol/L (ref 3.5–5.1)
Sodium: 135 mmol/L (ref 135–145)

## 2020-03-31 LAB — CBC
HCT: 21.9 % — ABNORMAL LOW (ref 36.0–46.0)
Hemoglobin: 7.5 g/dL — ABNORMAL LOW (ref 12.0–15.0)
MCH: 30.5 pg (ref 26.0–34.0)
MCHC: 34.2 g/dL (ref 30.0–36.0)
MCV: 89 fL (ref 80.0–100.0)
Platelets: 346 10*3/uL (ref 150–400)
RBC: 2.46 MIL/uL — ABNORMAL LOW (ref 3.87–5.11)
RDW: 15.1 % (ref 11.5–15.5)
WBC: 19.6 10*3/uL — ABNORMAL HIGH (ref 4.0–10.5)
nRBC: 0 % (ref 0.0–0.2)

## 2020-03-31 LAB — GLUCOSE, CAPILLARY
Glucose-Capillary: 280 mg/dL — ABNORMAL HIGH (ref 70–99)
Glucose-Capillary: 220 mg/dL — ABNORMAL HIGH (ref 70–99)
Glucose-Capillary: 146 mg/dL — ABNORMAL HIGH (ref 70–99)
Glucose-Capillary: 123 mg/dL — ABNORMAL HIGH (ref 70–99)

## 2020-03-31 LAB — MAGNESIUM: Magnesium: 2.1 mg/dL (ref 1.7–2.4)

## 2020-03-31 LAB — PHOSPHORUS: Phosphorus: 3.9 mg/dL (ref 2.5–4.6)

## 2020-03-31 MED ORDER — VANCOMYCIN HCL IN DEXTROSE 1-5 GM/200ML-% IV SOLN: 1000.0000 mg | INTRAVENOUS | Status: DC

## 2020-03-31 MED ORDER — VANCOMYCIN HCL IN DEXTROSE 1-5 GM/200ML-% IV SOLN
1000.0000 mg | INTRAVENOUS | Status: DC
  Administered 2020-03-31 – 2020-04-03 (×2): 1000 mg via INTRAVENOUS
  Filled 2020-03-31 (×2): qty 200

## 2020-03-31 MED ORDER — FAMOTIDINE 20 MG PO TABS
20.0000 mg | ORAL_TABLET | Freq: Every day | ORAL | Status: DC
  Administered 2020-04-01 – 2020-04-09 (×9): 20 mg via ORAL
  Filled 2020-03-31 (×9): qty 1

## 2020-03-31 NOTE — Progress Notes (Signed)
PROGRESS NOTE    [name]  QIO:[id number] DOB: [birth year] DOA: 03/29/2020 PCP: [name], MD   Brief Narrative: [name] is a 64 y.o. female with medical history significant of metastatic breast cancer, diabetes mellitus, CKD stage III, CHF, presents in the emergency department with open draining left foot wound and associated fever.  Patient reports having this wound for a while,  she has recently established care with the wound clinic but it has recently gotten worse and started draining.  She reports seeing her primary care physician and was started on Keflex and then which was recently switched to Bactrim but she is not seeing any improvement.  She reports having fever,  associated chills and body ache.  she denies any shortness of breath,  cough. 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  ED Course: She was febrile with a fever of 101.9, heart rate 92, respiratory rate 18, blood pressure 134 / 75, O2 saturation 95% on room air. Labs: CMP: Sodium 133 potassium 4.8, chloride 100, bicarb 22, BUN 37, creatinine 2.74, glucose 381 , calcium 8.5, WBC 25.2 hemoglobin 8.6, hematocrit 26.1, platelet 359, LFTs slightly elevated, lactic acid 2.1. CXR: Stable nodular opacity in the lingula measuring 1.4 x 1.3 cm. Bilateral pleural effusions with bibasilar atelectasis.  Heart upper normal in size. Port-A-Cath tip in superior vena cava.  Xray Left foot: Soft tissue swelling and subcutaneous air consistent with localized infection predominately about the fifth MTP joint.  No definitive erosive changes are noted. If clinically indicated MRI may be helpful for further evaluation.  Patient was admitted for sepsis secondary to infected left foot wound,  MRI consistent with osteomyelitis.  Podiatry consulted,  Patient underwent fifth ray amputation,  tolerated well.  Patient is continued on IV antibiotics,   Podiatry  is following , wound care needs to be arranged.  Infectious diseases consulted to to guide antibiotic regimen.  ID recommended continue vancomycin for known MRSA and Flagyl and cefepime.  Assessment & Plan:   Principal Problem:   Sepsis ([place]) Active Problems:   Asthma   Benign essential HTN   Chronic kidney disease (CKD), stage V (HCC)   Chronic systolic CHF (congestive heart failure) (HCC)   Depression with anxiety   Type 2 diabetes mellitus with diabetic chronic kidney disease (HCC)   MI (mitral incompetence)   Carcinoma of upper-inner quadrant of left breast in female, estrogen receptor positive ([place])   Metastasis from malignant tumor of breast (HCC)   Anemia of chronic disease   Sepsis secondary to infected Left foot wound: POA: temp 101.9  HR 92 RR 18 BP 134/75 lactic acid 2.1.  Leukocytosis 25.5  Source foot infection. Start sepsis order set. She received IV fluids in the ED, Lactic acid normalized. Given Vanco, cefepime and Flagyl in the ED. We will continue vancomycin and cefepime,  pharmacy consulted. Adequate pain control with oxycodone as needed. Continue gentle hydration. Podiatry consulted,  MRI consistent with osteomyelitis. Patient underwent fifth ray amputation tolerated well. Infectious disease consulted to guide about antibiotic regimen. Continue vancomycin for known MRSA and continue cefepime and Flagyl. Wound care consult.  AKI on CKD stage III:  Baseline creatinine remains around 2.1.   Avoid nephrotoxic medications, continue gentle hydration,  recheck labs tomorrow morning.  Anemia of chronic disease: No obvious bleeding, hemoglobin is stable.  Hypertension:  Continue home medications.  Diabetes mellitus Continue Lantus 55 units at night. Sliding scale,  obtain  hemoglobin A1c.  Elevated LFTs:  Could be due to metastatic lesion.  Metastatic breast cancer; She follows up outpatient with oncologist, consider up oncology  consult.  Depression with anxiety: Resume home meds   DVT prophylaxis:  Lovenox Code Status: Full Family Communication: Discussed with patient,  Nobody at bedside. Disposition Plan: Anticipated discharge home once medically clear. Consults called: Podiatrist    Consultants:    Podiatry   Procedures:  Partial 5th ray amputation left foot Antimicrobials: Anti-infectives (From admission, onward)   Start     Dose/Rate Route Frequency Ordered Stop   04/02/20 1000  vancomycin (VANCOCIN) IVPB 1000 mg/200 mL premix  Status:  Discontinued        1,000 mg 200 mL/hr over 60 Minutes Intravenous Every 48 hours 03/31/20 1041 03/31/20 1042   03/31/20 1800  vancomycin (VANCOCIN) IVPB 1000 mg/200 mL premix        1,000 mg 200 mL/hr over 60 Minutes Intravenous Every 48 hours 03/31/20 1042     03/31/20 1000  vancomycin (VANCOREADY) IVPB 750 mg/150 mL  Status:  Discontinued        750 mg 150 mL/hr over 60 Minutes Intravenous Every 48 hours 03/30/20 1327 03/31/20 1041   03/30/20 2200  metroNIDAZOLE (FLAGYL) IVPB 500 mg        500 mg 100 mL/hr over 60 Minutes Intravenous Every 8 hours 03/30/20 1722     03/30/20 2000  ceFEPIme (MAXIPIME) 2 g in sodium chloride 0.9 % 100 mL IVPB        2 g 200 mL/hr over 30 Minutes Intravenous Every 24 hours 03/30/20 1723     03/30/20 1800  vancomycin (VANCOCIN) IVPB 1000 mg/200 mL premix  Status:  Discontinued        1,000 mg 200 mL/hr over 60 Minutes Intravenous Every 24 hours 03/29/20 1735 03/30/20 1327   03/30/20 1400  ceFEPIme (MAXIPIME) 2 g in sodium chloride 0.9 % 100 mL IVPB  Status:  Discontinued        2 g 200 mL/hr over 30 Minutes Intravenous Every 24 hours 03/29/20 1735 03/30/20 1323   03/29/20 1700  vancomycin (VANCOCIN) IVPB 1000 mg/200 mL premix  Status:  Discontinued        1,000 mg 200 mL/hr over 60 Minutes Intravenous  Once 03/29/20 1658 03/29/20 1700   03/29/20 1630  vancomycin (VANCOREADY) IVPB 500 mg/100 mL        500 mg 100 mL/hr  over 60 Minutes Intravenous  Once 03/29/20 1541 03/29/20 1834   03/29/20 1545  metroNIDAZOLE (FLAGYL) IVPB 500 mg  Status:  Discontinued        500 mg 100 mL/hr over 60 Minutes Intravenous Every 8 hours 03/29/20 1534 03/29/20 1714   03/29/20 1530  ceFEPIme (MAXIPIME) 2 g in sodium chloride 0.9 % 100 mL IVPB        2 g 200 mL/hr over 30 Minutes Intravenous  Once 03/29/20 1522 03/29/20 1631   03/29/20 1530  vancomycin (VANCOCIN) IVPB 1000 mg/200 mL premix        1,000 mg 200 mL/hr over 60 Minutes Intravenous  Once 03/29/20 1522 03/29/20 1834      Subjective: Patient was seen and examined at bedside.  No overnight events.  She reports feeling better.  Her blood pressure is slightly elevated asking to resume her home medications.   Objective: Vitals:   03/31/20 0500 03/31/20 0748 03/31/20 0821 03/31/20 1127  BP: 123/68 126/74 138/76 122/74  Pulse: 71 73 73 71  Resp: 19  16 20 20   Temp: 97.8 F (36.6 C) 97.9 F (36.6 C) 98 F (36.7 C) 98.1 F (36.7 C)  TempSrc: Oral  Oral Oral  SpO2: 94% 94% 95% 96%  Weight:      Height:        Intake/Output Summary (Last 24 hours) at 03/31/2020 1209 Last data filed at 03/31/2020 0600 Gross per 24 hour  Intake 1520 ml  Output 60 ml  Net 1460 ml   Filed Weights   03/29/20 1119  Weight: 88 kg    Examination:  General exam: Appears calm and comfortable  Respiratory system: Clear to auscultation. Respiratory effort normal. Cardiovascular system: S1 & S2 heard, RRR. No JVD, murmurs, rubs, gallops or clicks. No pedal edema. Gastrointestinal system: Abdomen is nondistended, soft and nontender. No organomegaly or masses felt. Normal bowel sounds heard. Central nervous system: Alert and oriented. No focal neurological deficits. Extremities:  Left partial toe amputation. Skin: No rashes, lesions or ulcers Psychiatry: Judgement and insight appear normal. Mood & affect appropriate.     Data Reviewed: I have personally reviewed following labs  and imaging studies  CBC: Recent Labs  Lab 03/29/20 1127 03/30/20 0453 03/30/20 1658 03/31/20 0530  WBC 25.2* 21.2*  --  19.6*  NEUTROABS 23.2*  --   --   --   HGB 8.6* 7.3* 7.4* 7.5*  HCT 26.1* 22.0* 22.7* 21.9*  MCV 91.3 90.9  --  89.0  PLT 359 346  --  786   Basic Metabolic Panel: Recent Labs  Lab 03/29/20 1127 03/30/20 0453 03/31/20 0530  NA 133* 137 135  K 4.8 4.5 5.1  CL 100 105 106  CO2 22 23 24   GLUCOSE 381* 160* 347*  BUN 37* 36* 39*  CREATININE 2.74* 2.53* 2.43*  CALCIUM 8.5* 8.5* 8.0*  MG  --   --  2.1  PHOS  --   --  3.9   GFR: Estimated Creatinine Clearance: 24.4 mL/min (A) (by C-G formula based on SCr of 2.43 mg/dL (H)). Liver Function Tests: Recent Labs  Lab 03/29/20 1127 03/30/20 0453  AST 78* 30  ALT 55* 38  ALKPHOS 94 77  BILITOT 0.8 1.0  PROT 7.7 6.9  ALBUMIN 3.0* 2.6*   No results for input(s): LIPASE, AMYLASE in the last 168 hours. No results for input(s): AMMONIA in the last 168 hours. Coagulation Profile: Recent Labs  Lab 03/30/20 0453  INR 1.3*   Cardiac Enzymes: No results for input(s): CKTOTAL, CKMB, CKMBINDEX, TROPONINI in the last 168 hours. BNP (last 3 results) No results for input(s): PROBNP in the last 8760 hours. HbA1C: No results for input(s): HGBA1C in the last 72 hours. CBG: Recent Labs  Lab 03/30/20 1347 03/30/20 1626 03/30/20 2131 03/31/20 0745 03/31/20 1125  GLUCAP 121* 120* 230* 280* 220*   Lipid Profile: No results for input(s): CHOL, HDL, LDLCALC, TRIG, CHOLHDL, LDLDIRECT in the last 72 hours. Thyroid Function Tests: No results for input(s): TSH, T4TOTAL, FREET4, T3FREE, THYROIDAB in the last 72 hours. Anemia Panel: No results for input(s): VITAMINB12, FOLATE, FERRITIN, TIBC, IRON, RETICCTPCT in the last 72 hours. Sepsis Labs: Recent Labs  Lab 03/29/20 1127 03/29/20 1551 03/30/20 0453  PROCALCITON  --   --  6.02  LATICACIDVEN 2.1* 1.4  --     Recent Results (from the past 240 hour(s))   Aerobic Culture (superficial specimen)     Status: None   Collection Time: 03/26/20  3:10 PM   Specimen: Wound  Result Value Ref Range Status  Specimen Description   Final    WOUND Performed at [facility], [address], [place], [place] [address]    Special Requests   Final    LEFT FOOT Performed at [facility], [place], [place] [address]    Gram Stain   Final    NO WBC SEEN MODERATE GRAM POSITIVE COCCI FEW GRAM NEGATIVE RODS    Culture   Final    FEW METHICILLIN RESISTANT STAPHYLOCOCCUS AUREUS MIXED WITHIN OTHER ORGANISMS Performed at [facility], [place] [address], [place], [place] [address]    Report Status 03/29/2020 FINAL  Final   Organism ID, Bacteria METHICILLIN RESISTANT STAPHYLOCOCCUS AUREUS  Final      Susceptibility   Methicillin resistant staphylococcus aureus - MIC*    CIPROFLOXACIN >=8 RESISTANT Resistant     ERYTHROMYCIN >=8 RESISTANT Resistant     GENTAMICIN <=0.5 SENSITIVE Sensitive     OXACILLIN >=4 RESISTANT Resistant     TETRACYCLINE <=1 SENSITIVE Sensitive     VANCOMYCIN 1 SENSITIVE Sensitive     TRIMETH/SULFA <=10 SENSITIVE Sensitive     CLINDAMYCIN 1 INTERMEDIATE Intermediate     RIFAMPIN 16 RESISTANT Resistant     Inducible Clindamycin NEGATIVE Sensitive     * FEW METHICILLIN RESISTANT STAPHYLOCOCCUS AUREUS  Blood culture (routine x 2)     Status: None (Preliminary result)   Collection Time: 03/29/20  3:51 PM   Specimen: BLOOD  Result Value Ref Range Status   Specimen Description BLOOD BLOOD RIGHT HAND  Final   Special Requests   Final    BOTTLES DRAWN AEROBIC AND ANAEROBIC Blood Culture adequate volume   Culture   Final    NO GROWTH < 24 HOURS Performed at [facility], [address], [place], [place] [address]    Report Status PENDING  Incomplete  Blood culture (routine x 2)     Status: None (Preliminary result)   Collection Time: 03/29/20  3:51 PM   Specimen: BLOOD  Result Value  Ref Range Status   Specimen Description BLOOD LEFT ANTECUBITAL  Final   Special Requests   Final    BOTTLES DRAWN AEROBIC AND ANAEROBIC Blood Culture adequate volume   Culture   Final    NO GROWTH < 24 HOURS Performed at [facility], [address] [place], [place] [address]    Report Status PENDING  Incomplete  SARS Coronavirus 2 by RT PCR (hospital order, performed in [place] hospital lab) Nasopharyngeal Nasopharyngeal Swab     Status: None   Collection Time: 03/29/20  5:15 PM   Specimen: Nasopharyngeal Swab  Result Value Ref Range Status   SARS Coronavirus 2 NEGATIVE NEGATIVE Final    Comment: (NOTE) SARS-CoV-2 target nucleic acids are NOT DETECTED.  The SARS-CoV-2 RNA is generally detectable in upper and lower respiratory specimens during the acute phase of infection. The lowest concentration of SARS-CoV-2 viral copies this assay can detect is 250 copies / mL. A negative result does not preclude SARS-CoV-2 infection and should not be used as the sole basis for treatment or other patient management decisions.  A negative result may occur with improper specimen collection / handling, submission of specimen other than nasopharyngeal swab, presence of viral mutation(s) within the areas targeted by this assay, and inadequate number of viral copies (<250 copies / mL). A negative result must be combined with clinical observations, patient history, and epidemiological information.  Fact Sheet for Patients:   StrictlyIdeas.no  Fact Sheet  for Healthcare Providers: BankingDealers.co.za  This test is not yet approved or  cleared by the [place] and has been authorized for detection and/or diagnosis of SARS-CoV-2 by FDA under an Emergency Use Authorization (EUA).  This EUA will remain in effect (meaning this test can be used) for the duration of the COVID-19 declaration under Section 564(b)(1) of the Act, 21  U.S.C. section 360bbb-3(b)(1), unless the authorization is terminated or revoked sooner.  Performed at [facility], [address] [place], [place] [address]   Aerobic/Anaerobic Culture (surgical/deep wound)     Status: None (Preliminary result)   Collection Time: 03/30/20  1:19 PM   Specimen: PATH Other; Tissue  Result Value Ref Range Status   Specimen Description   Final    WOUND Performed at [facility], [address], [place], [place] [address]    Special Requests   Final    LEFT 5TH TOE JOINT INFECTION Performed at [facility], [address] [place], [place] [address]    Gram Stain   Final    NO WBC SEEN RARE GRAM POSITIVE COCCI IN PAIRS Performed at [facility], [place] [address], [place], [place] [address]    Culture PENDING  Incomplete   Report Status PENDING  Incomplete         Radiology Studies: DG Chest 2 View  Result Date: 03/29/2020 CLINICAL DATA:  Shortness of breath and dizziness.  Breast carcinoma EXAM: CHEST - 2 VIEW COMPARISON:  Chest radiograph September 27, 2019; PET-CT March 28, 2020 FINDINGS: There are pleural effusions bilaterally with bibasilar atelectatic change. The masslike area in the lingula seen on recent PET study is appreciable by radiography measuring 1.4 x 1.3 cm. Lungs elsewhere are clear. Heart is upper normal in size with pulmonary vascularity normal. Port-A-Cath tip is in the superior vena cava. No adenopathy is evident by radiography. IMPRESSION: Stable nodular opacity in the lingula measuring 1.4 x 1.3 cm. Bilateral pleural effusions with bibasilar atelectasis. No new opacity evident compared to recent PET study. Heart upper normal in size.  Port-A-Cath tip in superior vena cava. Electronically Signed   By: Lowella Grip III M.D.   On: 03/29/2020 12:33   MR FOOT LEFT WO CONTRAST  Result Date: 03/30/2020 CLINICAL DATA:  Foot swelling, diabetes, possible osteomyelitis. Gas in the soft tissues at  the base of the small toe. EXAM: MRI OF THE LEFT FOOT WITHOUT CONTRAST TECHNIQUE: Multiplanar, multisequence MR imaging of the left forefoot was performed. No intravenous contrast was administered. COMPARISON:  Radiographs 03/29/2020 FINDINGS: Bones/Joint/Cartilage Abnormal edema, cortical destruction/demineralization, and reduced T1 signal in the distal metaphysis and head of the fifth metatarsal and in the proximal phalanx of the small toe (especially proximally) compatible with osteomyelitis. Septic joint is a distinct possibility although the lack of a substantial joint effusion may indicate that the joint is draining to the plantar skin surface. There is synovitis in the fifth MTP joint as well as gas along the margins of the joint capsule. No other regions of osteomyelitis identified. Ligaments Lisfranc ligament intact. Muscles and Tendons Diffuse edema within and along regional musculature which could be neurogenic or due to myositis. Soft tissues Lateral and plantar to the fifth MTP joint there is abnormal gas, suspected cutaneous ulceration, and extensive inflammation. Some tiny amount of metal artifact in this vicinity possibly from bandaging, there is no visible metal foreign body on recent radiographs. Cannot exclude draining sinus tract to the skin from the fifth MTP joint.  Do not see a readily drainable abscess although there is extensive subcutaneous edema tracking in the foot, especially dorsally, and into the toes. Cellulitis is a distinct possibility. IMPRESSION: 1. Osteomyelitis of the distal metaphysis and head of the fifth metatarsal and in the proximal phalanx of the small toe. 2. Fifth MTP septic joint is a distinct possibility although the lack of a substantial joint effusion may indicate that the joint is draining to the plantar or lateral surfaces. Loculations of gas in the soft tissues along the margins of the fifth MTP joint. 3. Extensive subcutaneous edema tracking in the foot,  especially dorsally, and into the toes. Cellulitis is a distinct possibility. 4. Diffuse edema within and along regional musculature which could be neurogenic or due to myositis. Electronically Signed   By: Van Clines M.D.   On: 03/30/2020 08:50   DG Foot Complete Left  Result Date: 03/29/2020 CLINICAL DATA:  Left foot wound, initial encounter EXAM: LEFT FOOT - COMPLETE 3+ VIEW COMPARISON:  None. FINDINGS: Considerable soft tissue swelling is noted in the distal aspect of the foot. Some subcutaneous air is noted about the fifth MTP joint. These changes correspond to the given clinical history of localized infection. No definitive fracture or bony erosive changes are noted. No other focal abnormality is seen. IMPRESSION: Soft tissue swelling and subcutaneous air consistent with localized infection predominately about the fifth MTP joint. No definitive erosive changes are noted. If clinically indicated MRI may be helpful for further evaluation. Electronically Signed   By: Inez Catalina M.D.   On: 03/29/2020 15:56   DG MINI C-ARM IMAGE ONLY  Result Date: 03/30/2020 There is no interpretation for this exam.  This order is for images obtained during a surgical procedure.  Please See "Surgeries" Tab for more information regarding the procedure.   ECHOCARDIOGRAM COMPLETE  Result Date: 03/30/2020    ECHOCARDIOGRAM REPORT   Patient Name:   HAEDYN BREAU Date of Exam: 03/30/2020 Medical Rec #:  [id number]      Height:       62.0 in Accession #:    [id number]     Weight:       194.0 lb Date of Birth:  10-25-1955       BSA:          1.887 m Patient Age:    17 years       BP:           129/76 mmHg Patient Gender: F              HR:           88 bpm. Exam Location:  [facility] Procedure: 2D Echo, Cardiac Doppler and Color Doppler Indications:     Fever 780.6  History:         Patient has no prior history of Echocardiogram examinations.                  CHF, Stroke, Arrythmias:PAC; Risk Factors:Hypertension.   Sonographer:     Sherrie Sport RDCS (AE) Referring Phys:  [place] Diagnosing Phys: Serafina Royals MD IMPRESSIONS  1. Left ventricular ejection fraction, by estimation, is 55 to 60%. The left ventricle has normal function. The left ventricle has no regional wall motion abnormalities. Left ventricular diastolic parameters were normal.  2. Right ventricular systolic function is normal. The right ventricular size is normal. There is normal pulmonary artery systolic pressure.  3. Left atrial size was mildly dilated.  4. The mitral  valve is normal in structure. Mild to moderate mitral valve regurgitation.  5. The aortic valve is normal in structure. Aortic valve regurgitation is not visualized. FINDINGS  Left Ventricle: Left ventricular ejection fraction, by estimation, is 55 to 60%. The left ventricle has normal function. The left ventricle has no regional wall motion abnormalities. The left ventricular internal cavity size was normal in size. There is  no left ventricular hypertrophy. Left ventricular diastolic parameters were normal. Right Ventricle: The right ventricular size is normal. No increase in right ventricular wall thickness. Right ventricular systolic function is normal. There is normal pulmonary artery systolic pressure. The tricuspid regurgitant velocity is 2.42 m/s, and  with an assumed right atrial pressure of 10 mmHg, the estimated right ventricular systolic pressure is 38.1 mmHg. Left Atrium: Left atrial size was mildly dilated. Right Atrium: Right atrial size was normal in size. Pericardium: There is no evidence of pericardial effusion. Mitral Valve: The mitral valve is normal in structure. Mild to moderate mitral valve regurgitation. Tricuspid Valve: The tricuspid valve is normal in structure. Tricuspid valve regurgitation is trivial. Aortic Valve: The aortic valve is normal in structure. Aortic valve regurgitation is not visualized. Aortic valve mean gradient measures 6.0 mmHg. Aortic  valve peak gradient measures 10.4 mmHg. Aortic valve area, by VTI measures 1.74 cm. Pulmonic Valve: The pulmonic valve was normal in structure. Pulmonic valve regurgitation is not visualized. Aorta: The aortic root and ascending aorta are structurally normal, with no evidence of dilitation. IAS/Shunts: No atrial level shunt detected by color flow Doppler.  LEFT VENTRICLE PLAX 2D LVIDd:         4.30 cm  Diastology LVIDs:         2.79 cm  LV e' lateral:   7.51 cm/s LV PW:         1.43 cm  LV E/e' lateral: 14.1 LV IVS:        1.17 cm  LV e' medial:    6.31 cm/s LVOT diam:     2.00 cm  LV E/e' medial:  16.8 LV SV:         58 LV SV Index:   31 LVOT Area:     3.14 cm  RIGHT VENTRICLE RV Basal diam:  2.73 cm RV S prime:     15.20 cm/s TAPSE (M-mode): 3.9 cm LEFT ATRIUM             Index       RIGHT ATRIUM           Index LA diam:        4.30 cm 2.28 cm/m  RA Area:     16.00 cm LA Vol (A2C):   65.2 ml 34.55 ml/m RA Volume:   41.00 ml  21.73 ml/m LA Vol (A4C):   42.3 ml 22.42 ml/m LA Biplane Vol: 55.4 ml 29.36 ml/m  AORTIC VALVE                    PULMONIC VALVE AV Area (Vmax):    1.67 cm     PV Vmax:        0.71 m/s AV Area (Vmean):   1.59 cm     PV Peak grad:   2.0 mmHg AV Area (VTI):     1.74 cm     RVOT Peak grad: 3 mmHg AV Vmax:           161.00 cm/s AV Vmean:          114.333 cm/s  AV VTI:            0.337 m AV Peak Grad:      10.4 mmHg AV Mean Grad:      6.0 mmHg LVOT Vmax:         85.70 cm/s LVOT Vmean:        57.800 cm/s LVOT VTI:          0.186 m LVOT/AV VTI ratio: 0.55  AORTA Ao Root diam: 2.70 cm MITRAL VALVE                TRICUSPID VALVE MV Area (PHT): 5.66 cm     TR Peak grad:   23.4 mmHg MV Decel Time: 134 msec     TR Vmax:        242.00 cm/s MV E velocity: 106.00 cm/s MV A velocity: 150.00 cm/s  SHUNTS MV E/A ratio:  0.71         Systemic VTI:  0.19 m                             Systemic Diam: 2.00 cm Serafina Royals MD Electronically signed by Serafina Royals MD Signature Date/Time:  03/30/2020/1:20:11 PM    Final      Scheduled Meds:  amLODipine  10 mg Oral Daily   aspirin EC  81 mg Oral Daily   atenolol  100 mg Oral BID   calcitRIOL  0.25 mcg Oral Daily   cloNIDine  0.2 mg Oral BID   docusate sodium  100 mg Oral BID   enoxaparin (LOVENOX) injection  30 mg Subcutaneous Q24H   famotidine  20 mg Oral BID   ferrous sulfate  325 mg Oral BID WC   FLUoxetine  20 mg Oral BID   insulin aspart  0-5 Units Subcutaneous QHS   insulin aspart  0-9 Units Subcutaneous TID WC   insulin detemir  55 Units Subcutaneous Daily   senna  1 tablet Oral BID   simvastatin  20 mg Oral QPM   Continuous Infusions:  sodium chloride 50 mL/hr at 03/30/20 1946   ceFEPime (MAXIPIME) IV Stopped (03/30/20 2020)   metronidazole 500 mg (03/31/20 0500)   vancomycin       LOS: 2 days    Time spent: 25 mins.    Shawna Clamp, MD [facility]   If 7PM-7AM, please contact night-coverage

## 2020-03-31 NOTE — Progress Notes (Signed)
Daily Progress Note   Subjective  - 1 Day Post-Op  F/u left foot 5th ray amp  Objective Vitals:   03/31/20 0500 03/31/20 0748 03/31/20 0821 03/31/20 1127  BP: 123/68 126/74 138/76 122/74  Pulse: 71 73 73 71  Resp: 19 16 20 20   Temp: 97.8 F (36.6 C) 97.9 F (36.6 C) 98 F (36.7 C) 98.1 F (36.7 C)  TempSrc: Oral  Oral Oral  SpO2: 94% 94% 95% 96%  Weight:      Height:        Physical Exam: Wound stable.  Dressing changed.  No purulence.  Erythema improved.      Laboratory CBC    Component Value Date/Time   WBC 19.6 (H) 03/31/2020 0530   HGB 7.5 (L) 03/31/2020 0530   HGB 12.5 06/06/2014 1102   HCT 21.9 (L) 03/31/2020 0530   HCT 37.6 06/06/2014 1102   PLT 346 03/31/2020 0530   PLT 396 06/06/2014 1102    BMET    Component Value Date/Time   NA 135 03/31/2020 0530   NA 130 (L) 06/06/2014 1102   K 5.1 03/31/2020 0530   K 3.9 06/06/2014 1102   CL 106 03/31/2020 0530   CL 95 (L) 06/06/2014 1102   CO2 24 03/31/2020 0530   CO2 28 06/06/2014 1102   GLUCOSE 347 (H) 03/31/2020 0530   GLUCOSE 349 (H) 06/06/2014 1102   BUN 39 (H) 03/31/2020 0530   BUN 17 06/06/2014 1102   CREATININE 2.43 (H) 03/31/2020 0530   CREATININE 1.63 (H) 06/06/2014 1102   CALCIUM 8.0 (L) 03/31/2020 0530   CALCIUM 9.2 06/06/2014 1102   GFRNONAA 20 (L) 03/31/2020 0530   GFRNONAA 35 (L) 06/06/2014 1102   GFRAA 24 (L) 03/31/2020 0530   GFRAA 42 (L) 06/06/2014 1102    Assessment/Planning: S/P amputation 5ht ray for osteo/abscess   Wound vac changed.   C/W M/W/F vac dressings.  Awaiting cultures.  GPC's currently.  NWB left foot.    [name] A  03/31/2020, 2:14 PM

## 2020-03-31 NOTE — Progress Notes (Signed)
Physical Therapy Evaluation Patient Details Name: [name] MRN: [id number] DOB: 05-04-1956 Today's Date: 03/31/2020   History of Present Illness  Per MD note:[name] is a 64 y.o. female with medical history significant of metastatic breast cancer, diabetes mellitus, CKD stage III, CHF, presents in the emergency department with open draining left foot wound and associated fever.  Patient reports having this wound for a while,  she has recently established care with the wound clinic but it has recently gotten worse and started draining.  She reports seeing her primary care physician and was started on Keflex and then which was recently switched to Bactrim but she is not seeing any improvement.  She reports having fever,  associated chills and body ache.  she denies any shortness of breath,  cough. 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  Clinical Impression  Patient agrees to PT evaluation. She reports very limited ambulation at home prior to hospitalization with legs giving out and multiple falls; she was waiting for B knee replacement surgery in October. She was currently receiving HHPT for strengthening BLE.  She has decreased BLE strength, and needs mod assist for supine to sit bed mobility and max assist for sit to supine bed mobility. She is not able to side scoot up towards the head of the bed from a sitting position or perform sit to stand with  NWB LLE. She is not able to reposition herself in bed or scoot up the bed in supine. She reports no pain. She is not able to stand with max assist or walk keeping NWB LLE. Patient will benefit from skilled PT to improve strength and mobility.     Follow Up Recommendations SNF    Equipment Recommendations  Wheelchair (measurements PT);Wheelchair cushion (measurements PT)    Recommendations for Other Services       Precautions / Restrictions  Precautions Precautions: Fall Restrictions Weight Bearing Restrictions: Yes LLE Weight Bearing: Non weight bearing      Mobility  Bed Mobility Overal bed mobility: Needs Assistance Bed Mobility: Supine to Sit;Sit to Supine     Supine to sit: Min assist Sit to supine: Max assist   General bed mobility comments:  (max VC to be NWB LLE)  Transfers Overall transfer level:  (unable to perform sit to stand with NWB LLE)                  Ambulation/Gait Ambulation/Gait assistance:  (unable)              Stairs            Wheelchair Mobility    Modified Rankin (Stroke Patients Only)       Balance Overall balance assessment: Modified Independent                                           Pertinent Vitals/Pain Pain Assessment: No/denies pain    Home Living Family/patient expects to be discharged to:: Private residence Living Arrangements: Non-relatives/Friends Available Help at Discharge: Friend(s)   Home Access: Stairs to enter Entrance Stairs-Rails: Right Entrance Stairs-Number of Steps: 4   Home Equipment: Walker - 2 wheels      Prior Function Level of Independence: Independent with assistive device(s)         Comments:  (ambulated 10 ft with Hx of mult  falls)     Hand Dominance        Extremity/Trunk Assessment   Upper Extremity Assessment Upper Extremity Assessment: Overall WFL for tasks assessed    Lower Extremity Assessment Lower Extremity Assessment: RLE deficits/detail;LLE deficits/detail RLE Deficits / Details:  (2/5 hip abd and flex, 3/5 knee ext) LLE Deficits / Details:  (2/5 hip abd and flex, 3/5 knee ext)       Communication   Communication: No difficulties  Cognition Arousal/Alertness: Awake/alert Behavior During Therapy: WFL for tasks assessed/performed Overall Cognitive Status: Within Functional Limits for tasks assessed                                        General  Comments      Exercises     Assessment/Plan    PT Assessment Patient needs continued PT services  PT Problem List Decreased strength;Decreased safety awareness       PT Treatment Interventions Gait training;Functional mobility training;Therapeutic activities;Therapeutic exercise;Balance training    PT Goals (Current goals can be found in the Care Plan section)  Acute Rehab PT Goals Patient Stated Goal: to walk PT Goal Formulation: Patient unable to participate in goal setting Time For Goal Achievement: 04/14/20 Potential to Achieve Goals: Fair    Frequency Min 2X/week   Barriers to discharge Inaccessible home environment;Decreased caregiver support      Co-evaluation               AM-PAC PT "6 Clicks" Mobility  Outcome Measure Help needed turning from your back to your side while in a flat bed without using bedrails?: A Lot Help needed moving from lying on your back to sitting on the side of a flat bed without using bedrails?: A Lot Help needed moving to and from a bed to a chair (including a wheelchair)?: Total Help needed standing up from a chair using your arms (e.g., wheelchair or bedside chair)?: Total Help needed to walk in hospital room?: Total Help needed climbing 3-5 steps with a railing? : Total 6 Click Score: 8    End of Session Equipment Utilized During Treatment: Gait belt Activity Tolerance: Patient tolerated treatment well;Patient limited by fatigue Patient left: in bed;with bed alarm set Nurse Communication: Mobility status PT Visit Diagnosis: Repeated falls (R29.6);Muscle weakness (generalized) (M62.81);Difficulty in walking, not elsewhere classified (R26.2)    Time: 9470-9628 PT Time Calculation (min) (ACUTE ONLY): 30 min   Charges:   PT Evaluation $PT Eval Low Complexity: 1 Low PT Treatments $Therapeutic Activity: 8-22 mins         Alanson Puls, PT DPT 03/31/2020, 11:23 AM

## 2020-04-01 LAB — GLUCOSE, CAPILLARY
Glucose-Capillary: 44 mg/dL — CL (ref 70–99)
Glucose-Capillary: 88 mg/dL (ref 70–99)
Glucose-Capillary: 63 mg/dL — ABNORMAL LOW (ref 70–99)
Glucose-Capillary: 68 mg/dL — ABNORMAL LOW (ref 70–99)
Glucose-Capillary: 69 mg/dL — ABNORMAL LOW (ref 70–99)
Glucose-Capillary: 108 mg/dL — ABNORMAL HIGH (ref 70–99)
Glucose-Capillary: 135 mg/dL — ABNORMAL HIGH (ref 70–99)
Glucose-Capillary: 146 mg/dL — ABNORMAL HIGH (ref 70–99)
Glucose-Capillary: 140 mg/dL — ABNORMAL HIGH (ref 70–99)

## 2020-04-01 LAB — BASIC METABOLIC PANEL
Anion gap: 7 (ref 5–15)
BUN: 38 mg/dL — ABNORMAL HIGH (ref 8–23)
CO2: 21 mmol/L — ABNORMAL LOW (ref 22–32)
Calcium: 8.4 mg/dL — ABNORMAL LOW (ref 8.9–10.3)
Chloride: 109 mmol/L (ref 98–111)
Creatinine, Ser: 2.39 mg/dL — ABNORMAL HIGH (ref 0.44–1.00)
GFR calc Af Amer: 24 mL/min — ABNORMAL LOW (ref >60)
GFR calc non Af Amer: 21 mL/min — ABNORMAL LOW (ref >60)
Glucose, Bld: 96 mg/dL (ref 70–99)
Potassium: 4.4 mmol/L (ref 3.5–5.1)
Sodium: 137 mmol/L (ref 135–145)

## 2020-04-01 LAB — CBC
HCT: 23 % — ABNORMAL LOW (ref 36.0–46.0)
Hemoglobin: 7.4 g/dL — ABNORMAL LOW (ref 12.0–15.0)
MCH: 29.7 pg (ref 26.0–34.0)
MCHC: 32.2 g/dL (ref 30.0–36.0)
MCV: 92.4 fL (ref 80.0–100.0)
Platelets: 433 10*3/uL — ABNORMAL HIGH (ref 150–400)
RBC: 2.49 MIL/uL — ABNORMAL LOW (ref 3.87–5.11)
RDW: 15.3 % (ref 11.5–15.5)
WBC: 16.4 10*3/uL — ABNORMAL HIGH (ref 4.0–10.5)
nRBC: 0.1 % (ref 0.0–0.2)

## 2020-04-01 NOTE — NC FL2 (Signed)
[place] LEVEL OF CARE SCREENING TOOL     IDENTIFICATION  Patient Name: [name] Birthdate: Mar 31, 1956 Sex: female Admission Date (Current Location): 03/29/2020  [place] and [place] Number:  Engineering geologist and Address:  [facility], [address] [place], [place] [address]      Provider Number: [id number]  Attending Physician Name and Address:  Shawna Clamp, MD  Relative Name and Phone Number:       Current Level of Care: Hospital Recommended Level of Care: [place] Prior Approval Number:    Date Approved/Denied:   PASRR Number: [id number]  Discharge Plan: SNF    Current Diagnoses: Patient Active Problem List   Diagnosis Date Noted  . Sepsis ([place]) 03/29/2020  . Antineoplastic chemotherapy induced anemia 03/02/2020  . Weakness generalized 03/02/2020  . Fall at home 03/02/2020  . Tear of meniscus of knee 11/17/2019  . Metabolic acidosis   . Acute renal failure superimposed on stage 4 chronic kidney disease ([place]) 09/27/2019  . Metastatic breast cancer ([place]) 09/27/2019  . Hypertension associated with diabetes ([place]) 09/27/2019  . Hyperlipidemia associated with type 2 diabetes mellitus ([place]) 09/27/2019  . Hyperkalemia 09/27/2019  . Taking a statin medication 05/18/2019  . Anemia of chronic disease 04/27/2019  . Benign hypertensive kidney disease with chronic kidney disease 04/27/2019  . Hyposmolality and/or hyponatremia 04/27/2019  . Proteinuria 04/27/2019  . Secondary hyperparathyroidism of renal origin ([place]) 04/27/2019  . Goals of care, counseling/discussion 10/01/2018  . Major depressive disorder, recurrent, in remission ([place]) 09/13/2018  . Metastasis from malignant tumor of breast ([place]) 09/13/2018  . Genetic testing 07/02/2018  . Family history of breast cancer   . Family history of ovarian cancer   . Family history of pancreatic cancer   . Family history of colon cancer   . Family history  of stomach cancer   . Family history of prostate cancer   . Carcinoma of upper-inner quadrant of left breast in female, estrogen receptor positive ([place]) 05/23/2016  . Type 2 diabetes mellitus with diabetic chronic kidney disease ([place]) 08/27/2015  . Cerebrovascular accident (CVA) ([place]) 08/27/2015  . Benign essential HTN 12/15/2014  . Chronic systolic CHF (congestive heart failure) ([place]) 06/21/2014  . Combined fat and carbohydrate induced hyperlipemia 06/21/2014  . MI (mitral incompetence) 06/21/2014  . Asthma 06/09/2014  . Chronic kidney disease (CKD), stage V ([place]) 06/09/2014  . Depression with anxiety 06/09/2014    Orientation RESPIRATION BLADDER Height & Weight     Self, Time, Situation, Place    Continent Weight: 194 lb (88 kg) Height:  5\' 2"  (157.5 cm)  BEHAVIORAL SYMPTOMS/MOOD NEUROLOGICAL BOWEL NUTRITION STATUS      Continent    AMBULATORY STATUS COMMUNICATION OF NEEDS Skin   Extensive Assist Verbally                         Personal Care Assistance Level of Assistance  Bathing, Feeding, Dressing, Total care Bathing Assistance: Limited assistance Feeding assistance: Limited assistance Dressing Assistance: Limited assistance Total Care Assistance: Limited assistance   Functional Limitations Info             SPECIAL CARE FACTORS FREQUENCY  PT (By licensed PT), OT (By licensed OT)     PT Frequency: 5 x weekly OT Frequency: 5 x weekly            Contractures      Additional Factors Info  Code Status Code Status Info:  Full             Current Medications (04/01/2020):  This is the current hospital active medication list Current Facility-Administered Medications  Medication Dose Route Frequency Provider Last Rate Last Admin  . 0.9 %  sodium chloride infusion   Intravenous Continuous Samara Deist, DPM   Paused at 03/31/20 1653  . acetaminophen (TYLENOL) tablet 650 mg  650 mg Oral Q6H PRN Samara Deist, DPM       Or  . acetaminophen (TYLENOL)  suppository 650 mg  650 mg Rectal Q6H PRN Samara Deist, DPM      . albuterol (PROVENTIL) (2.5 MG/3ML) 0.083% nebulizer solution 2.5 mg  2.5 mg Inhalation Q6H PRN Samara Deist, DPM      . ALPRAZolam Duanne Moron) tablet 0.5 mg  0.5 mg Oral BID PRN Samara Deist, DPM      . amLODipine (NORVASC) tablet 10 mg  10 mg Oral Daily Samara Deist, DPM   10 mg at 04/01/20 0943  . aspirin EC tablet 81 mg  81 mg Oral Daily Samara Deist, DPM   81 mg at 04/01/20 0944  . atenolol (TENORMIN) tablet 100 mg  100 mg Oral BID Samara Deist, DPM   100 mg at 04/01/20 0946  . budesonide (PULMICORT) nebulizer solution 0.5 mg  2 mL Inhalation BID PRN Samara Deist, DPM      . calcitRIOL (ROCALTROL) capsule 0.25 mcg  0.25 mcg Oral Daily Samara Deist, DPM   0.25 mcg at 04/01/20 0946  . ceFEPIme (MAXIPIME) 2 g in sodium chloride 0.9 % 100 mL IVPB  2 g Intravenous Q24H Berton Mount, RPH   Stopped at 03/31/20 2143  . cloNIDine (CATAPRES) tablet 0.2 mg  0.2 mg Oral BID Samara Deist, DPM   0.2 mg at 04/01/20 0944  . docusate sodium (COLACE) capsule 100 mg  100 mg Oral BID Samara Deist, DPM   100 mg at 04/01/20 0943  . enoxaparin (LOVENOX) injection 30 mg  30 mg Subcutaneous Q24H Samara Deist, DPM   30 mg at 03/31/20 2109  . famotidine (PEPCID) tablet 20 mg  20 mg Oral Daily Shawna Clamp, MD   20 mg at 04/01/20 0943  . ferrous sulfate tablet 325 mg  325 mg Oral BID WC Samara Deist, DPM   325 mg at 04/01/20 0943  . FLUoxetine (PROZAC) capsule 20 mg  20 mg Oral BID Samara Deist, DPM   20 mg at 04/01/20 0946  . insulin aspart (novoLOG) injection 0-5 Units  0-5 Units Subcutaneous QHS Samara Deist, DPM   2 Units at 03/30/20 2139  . insulin aspart (novoLOG) injection 0-9 Units  0-9 Units Subcutaneous TID WC Samara Deist, DPM   1 Units at 03/31/20 1749  . insulin detemir (LEVEMIR) injection 55 Units  55 Units Subcutaneous Daily Samara Deist, DPM   55 Units at 03/31/20 [phone]  . metroNIDAZOLE (FLAGYL) IVPB 500 mg   500 mg Intravenous Q8H Leonel Ramsay, MD   Stopped at 04/01/20 [phone]  . ondansetron (ZOFRAN) tablet 4 mg  4 mg Oral Q6H PRN Samara Deist, DPM       Or  . ondansetron [facility]) injection 4 mg  4 mg Intravenous Q6H PRN Samara Deist, DPM   4 mg at 03/30/20 1321  . oxyCODONE (Oxy IR/ROXICODONE) immediate release tablet 5 mg  5 mg Oral Q8H PRN Samara Deist, DPM   5 mg at 04/01/20 0503  . senna (SENOKOT) tablet 8.6 mg  1 tablet Oral BID Samara Deist, DPM  8.6 mg at 04/01/20 0944  . simvastatin (ZOCOR) tablet 20 mg  20 mg Oral QPM Samara Deist, DPM   20 mg at 03/31/20 1750  . vancomycin (VANCOCIN) IVPB 1000 mg/200 mL premix  1,000 mg Intravenous Q48H Oswald Hillock, [place]   Stopped at 03/31/20 1779   Facility-Administered Medications Ordered in Other Encounters  Medication Dose Route Frequency Provider Last Rate Last Admin  . sodium chloride flush (NS) 0.9 % injection 10 mL  10 mL Intravenous PRN Cammie Sickle, MD   10 mL at 01/30/16 1054     Discharge Medications: Please see discharge summary for a list of discharge medications.  Relevant Imaging Results:  Relevant Lab Results:   Additional Information SS# [id number]  Meriel Flavors, LCSW

## 2020-04-01 NOTE — TOC Initial Note (Signed)
Transition of Care [facility]) - Initial/Assessment Note    Patient Details  Name: [name] MRN: [id number] Date of Birth: Jul 24, 1956  Transition of [facility]) CM/SW Contact:    Meriel Flavors, LCSW Phone Number: 04/01/2020, 10:21 AM  Clinical Narrative:                 CSW met with patient to complete ReAdm screen and discuss discharge plan. Patient drives and has a friend that lives with her that can also provide transportation, Patient is established with a primary care at [facility] and has no issues getting medications and uses [employer]. Patient has support and plans to return home after rehab. Patient understands it is likely she will be recommended to go to skilled nursing for rehab services at discharge, Patient shared she has been to [facility] before and is okay going back there. CSW confirmed starting the process of SNF placement with [facility] and will continue to keep her updated during the process.   Expected Discharge Plan: Skilled Nursing Facility Barriers to Discharge: No Barriers Identified   Patient Goals and CMS Choice Patient states their goals for this hospitalization and ongoing recovery are:: To get back home and be independant like I was CMS Medicare.gov Compare Post Acute Care list provided to:: Patient Choice offered to / list presented to : Patient  Expected Discharge Plan and Services Expected Discharge Plan: [place] In-house Referral: NA Discharge Planning Services: NA Post Acute Care Choice: [place] arrangements for the past 2 months: Single Family Home                 DME Arranged: N/A DME Agency: [facility]       HH Arranged: NA HH Agency: [facility]        Prior Living Arrangements/Services Living arrangements for the past 2 months: [place] Lives with:: Friends Patient language and need for interpreter reviewed:: No Do you feel safe going back to the place where you  live?: Yes      Need for Family Participation in Patient Care: Yes (Comment) (Patient has a close friend that lives with her)   Current home services: DME, Home PT, Other (comment) (Patient had been active with [facility]/PT) Criminal Activity/Legal Involvement Pertinent to Current Situation/Hospitalization: No - Comment as needed  Activities of Daily Living Home Assistive Devices/Equipment: Environmental consultant (specify type) ADL Screening (condition at time of admission) Patient's cognitive ability adequate to safely complete daily activities?: Yes Is the patient deaf or have difficulty hearing?: No Does the patient have difficulty seeing, even when wearing glasses/contacts?: No Does the patient have difficulty concentrating, remembering, or making decisions?: No Patient able to express need for assistance with ADLs?: Yes Does the patient have difficulty dressing or bathing?: No Independently performs ADLs?: Yes (appropriate for developmental age) Does the patient have difficulty walking or climbing stairs?: Yes Weakness of Legs: Both Weakness of Arms/Hands: None  Permission Sought/Granted                  Emotional Assessment Appearance:: Appears younger than stated age Attitude/Demeanor/Rapport: Engaged Affect (typically observed): Accepting Orientation: : Oriented to Self, Oriented to Place, Oriented to  Time, Oriented to Situation Alcohol / Substance Use: Not Applicable Psych Involvement: No (comment)  Admission diagnosis:  Sepsis ([place]) [A41.9] Sepsis due to cellulitis ([place]) [L03.90, A41.9] Patient Active Problem List   Diagnosis Date Noted  . Sepsis ([place]) 03/29/2020  . Antineoplastic chemotherapy induced anemia  03/02/2020  . Weakness generalized 03/02/2020  . Fall at home 03/02/2020  . Tear of meniscus of knee 11/17/2019  . Metabolic acidosis   . Acute renal failure superimposed on stage 4 chronic kidney disease (HCC) 09/27/2019  . Metastatic breast cancer (HCC) 09/27/2019  .  Hypertension associated with diabetes (HCC) 09/27/2019  . Hyperlipidemia associated with type 2 diabetes mellitus (HCC) 09/27/2019  . Hyperkalemia 09/27/2019  . Taking a statin medication 05/18/2019  . Anemia of chronic disease 04/27/2019  . Benign hypertensive kidney disease with chronic kidney disease 04/27/2019  . Hyposmolality and/or hyponatremia 04/27/2019  . Proteinuria 04/27/2019  . Secondary hyperparathyroidism of renal origin (HCC) 04/27/2019  . Goals of care, counseling/discussion 10/01/2018  . Major depressive disorder, recurrent, in remission (HCC) 09/13/2018  . Metastasis from malignant tumor of breast (HCC) 09/13/2018  . Genetic testing 07/02/2018  . Family history of breast cancer   . Family history of ovarian cancer   . Family history of pancreatic cancer   . Family history of colon cancer   . Family history of stomach cancer   . Family history of prostate cancer   . Carcinoma of upper-inner quadrant of left breast in female, estrogen receptor positive (HCC) 05/23/2016  . Type 2 diabetes mellitus with diabetic chronic kidney disease (HCC) 08/27/2015  . Cerebrovascular accident (CVA) (HCC) 08/27/2015  . Benign essential HTN 12/15/2014  . Chronic systolic CHF (congestive heart failure) (HCC) 06/21/2014  . Combined fat and carbohydrate induced hyperlipemia 06/21/2014  . MI (mitral incompetence) 06/21/2014  . Asthma 06/09/2014  . Chronic kidney disease (CKD), stage V (HCC) 06/09/2014  . Depression with anxiety 06/09/2014   PCP:  Hande, Vishwanath, MD Pharmacy:   [id number] - [place], [place] - [address] [place] [address] Phone: [phone] Fax: [phone]  [facility] [place] [place] - [address] [place] [address] Phone: [phone] Fax: [phone]  [facility] [place], [place] - [address] [place] [address] Phone: [phone] Fax: [phone]     Social Determinants of Health (SDOH) Interventions    Readmission Risk Interventions Readmission Risk Prevention Plan 04/01/2020  Transportation Screening Complete  Medication Review (RN Care Manager) Complete  PCP or Specialist appointment within 3-5 days of discharge Complete  HRI or Home Care Consult Complete  SW Recovery Care/Counseling Consult Complete  Palliative Care Screening Not Applicable  Skilled Nursing Facility Not Applicable  Some recent data might be hidden    

## 2020-04-01 NOTE — Progress Notes (Signed)
PROGRESS NOTE    [name]  RXV:[id number] DOB: 01-Aug-1956 DOA: 03/29/2020 PCP: [name], MD   Brief Narrative: [name] is a 64 y.o. female with medical history significant of metastatic breast cancer, diabetes mellitus, CKD stage III, CHF, presents in the emergency department with open draining left foot wound and associated fever.  Patient reports having this wound for a while,  she has recently established care with the wound clinic but it has recently gotten worse and started draining.  She reports seeing her primary care physician and was started on Keflex and then which was recently switched to Bactrim but she is not seeing any improvement.  She reports having fever,  associated chills and body ache.  she denies any shortness of breath,  cough. 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  ED Course: She was febrile with a fever of 101.9, heart rate 92, respiratory rate 18, blood pressure 134 / 75, O2 saturation 95% on room air. Labs: CMP: Sodium 133 potassium 4.8, chloride 100, bicarb 22, BUN 37, creatinine 2.74, glucose 381 , calcium 8.5, WBC 25.2 hemoglobin 8.6, hematocrit 26.1, platelet 359, LFTs slightly elevated, lactic acid 2.1. CXR: Stable nodular opacity in the lingula measuring 1.4 x 1.3 cm. Bilateral pleural effusions with bibasilar atelectasis.  Heart upper normal in size. Port-A-Cath tip in superior vena cava.  Xray Left foot: Soft tissue swelling and subcutaneous air consistent with localized infection predominately about the fifth MTP joint.  No definitive erosive changes are noted. If clinically indicated MRI may be helpful for further evaluation.  Patient was admitted for sepsis secondary to infected left foot wound,  MRI consistent with osteomyelitis.  Podiatry consulted,  Patient underwent fifth ray amputation,  tolerated well.  Patient is continued on IV antibiotics,   Podiatry  is following , wound care needs to be arranged.  Infectious diseases consulted to to guide antibiotic regimen.  ID recommended continue vancomycin for known MRSA and Flagyl and cefepime.  Assessment & Plan:   Principal Problem:   Sepsis ([place]) Active Problems:   Asthma   Benign essential HTN   Chronic kidney disease (CKD), stage V (HCC)   Chronic systolic CHF (congestive heart failure) (HCC)   Depression with anxiety   Type 2 diabetes mellitus with diabetic chronic kidney disease (HCC)   MI (mitral incompetence)   Carcinoma of upper-inner quadrant of left breast in female, estrogen receptor positive ([place])   Metastasis from malignant tumor of breast (HCC)   Anemia of chronic disease   Sepsis secondary to infected Left foot wound: POA: temp 101.9  HR 92 RR 18 BP 134/75 lactic acid 2.1.  Leukocytosis 25.5  Source foot infection. Start sepsis order set. She received IV fluids in the ED, Lactic acid normalized. Given Vanco, cefepime and Flagyl in the ED. We will continue vancomycin and cefepime,  pharmacy consulted. Adequate pain control with oxycodone as needed. Continue gentle hydration. Podiatry consulted,  MRI consistent with osteomyelitis. Patient underwent fifth ray amputation tolerated well. Infectious disease consulted to guide about antibiotic regimen. Continue vancomycin for known MRSA and continue cefepime and Flagyl. Wound care consult.  AKI on CKD stage III:  >>> Improving Baseline creatinine remains around 2.1.   Avoid nephrotoxic medications, continue gentle hydration,  recheck labs tomorrow morning.  Anemia of chronic disease: No obvious bleeding, hemoglobin is stable.  Hypertension:  Continue home medications.  Diabetes mellitus Continue Lantus 55 units at night. Sliding scale,  obtain hemoglobin A1c.  Elevated LFTs:  Could be due to metastatic lesion.  Metastatic breast cancer; She follows up outpatient with oncologist, consider up oncology  consult.  Depression with anxiety: Resume home meds   DVT prophylaxis:  Lovenox Code Status: Full Family Communication: Discussed with patient,  Nobody at bedside. Disposition Plan: Anticipated discharge to SNF  Consults called: Podiatrist Patient is not medically clear awaiting cultures and antibiotic regimen at discharge.   Consultants:    Podiatry   Procedures:  Partial 5th ray amputation left foot Antimicrobials: Anti-infectives (From admission, onward)   Start     Dose/Rate Route Frequency Ordered Stop   04/02/20 1000  vancomycin (VANCOCIN) IVPB 1000 mg/200 mL premix  Status:  Discontinued        1,000 mg 200 mL/hr over 60 Minutes Intravenous Every 48 hours 03/31/20 1041 03/31/20 1042   03/31/20 1800  vancomycin (VANCOCIN) IVPB 1000 mg/200 mL premix        1,000 mg 200 mL/hr over 60 Minutes Intravenous Every 48 hours 03/31/20 1042     03/31/20 1000  vancomycin (VANCOREADY) IVPB 750 mg/150 mL  Status:  Discontinued        750 mg 150 mL/hr over 60 Minutes Intravenous Every 48 hours 03/30/20 1327 03/31/20 1041   03/30/20 2200  metroNIDAZOLE (FLAGYL) IVPB 500 mg        500 mg 100 mL/hr over 60 Minutes Intravenous Every 8 hours 03/30/20 1722     03/30/20 2000  ceFEPIme (MAXIPIME) 2 g in sodium chloride 0.9 % 100 mL IVPB        2 g 200 mL/hr over 30 Minutes Intravenous Every 24 hours 03/30/20 1723     03/30/20 1800  vancomycin (VANCOCIN) IVPB 1000 mg/200 mL premix  Status:  Discontinued        1,000 mg 200 mL/hr over 60 Minutes Intravenous Every 24 hours 03/29/20 1735 03/30/20 1327   03/30/20 1400  ceFEPIme (MAXIPIME) 2 g in sodium chloride 0.9 % 100 mL IVPB  Status:  Discontinued        2 g 200 mL/hr over 30 Minutes Intravenous Every 24 hours 03/29/20 1735 03/30/20 1323   03/29/20 1700  vancomycin (VANCOCIN) IVPB 1000 mg/200 mL premix  Status:  Discontinued        1,000 mg 200 mL/hr over 60 Minutes Intravenous  Once 03/29/20 1658 03/29/20 1700   03/29/20 1630   vancomycin (VANCOREADY) IVPB 500 mg/100 mL        500 mg 100 mL/hr over 60 Minutes Intravenous  Once 03/29/20 1541 03/29/20 1834   03/29/20 1545  metroNIDAZOLE (FLAGYL) IVPB 500 mg  Status:  Discontinued        500 mg 100 mL/hr over 60 Minutes Intravenous Every 8 hours 03/29/20 1534 03/29/20 1714   03/29/20 1530  ceFEPIme (MAXIPIME) 2 g in sodium chloride 0.9 % 100 mL IVPB        2 g 200 mL/hr over 30 Minutes Intravenous  Once 03/29/20 1522 03/29/20 1631   03/29/20 1530  vancomycin (VANCOCIN) IVPB 1000 mg/200 mL premix        1,000 mg 200 mL/hr over 60 Minutes Intravenous  Once 03/29/20 1522 03/29/20 1834      Subjective: Patient was seen and examined at bedside.  No overnight events.  She reports feeling better.   She reports pain is better controlled, denies any overnight issues. Objective: Vitals:   03/31/20 2010 04/01/20 0424 04/01/20 0942 04/01/20 1234  BP: 126/70 (!) 150/78 (!) 151/89 Marland Kitchen)  144/78  Pulse: 70 76 70 73  Resp: 18 20 20 18   Temp: (!) 97.5 F (36.4 C) 97.6 F (36.4 C)  97.9 F (36.6 C)  TempSrc: Oral Oral  Oral  SpO2: 95% 96%  95%  Weight:      Height:        Intake/Output Summary (Last 24 hours) at 04/01/2020 1313 Last data filed at 04/01/2020 0914 Gross per 24 hour  Intake 2667.75 ml  Output 1015 ml  Net 1652.75 ml   Filed Weights   03/29/20 1119  Weight: 88 kg    Examination:  General exam: Appears calm and comfortable  Respiratory system: Clear to auscultation. Respiratory effort normal. Cardiovascular system: S1 & S2 heard, RRR. No JVD, murmurs, rubs, gallops or clicks. No pedal edema. Gastrointestinal system: Abdomen is nondistended, soft and nontender. No organomegaly or masses felt. Normal bowel sounds heard. Central nervous system: Alert and oriented. No focal neurological deficits. Extremities:  Left partial toe amputation. Skin: No rashes, lesions or ulcers Psychiatry: Judgement and insight appear normal. Mood & affect appropriate.      Data Reviewed: I have personally reviewed following labs and imaging studies  CBC: Recent Labs  Lab 03/29/20 1127 03/30/20 0453 03/30/20 1658 03/31/20 0530 04/01/20 0705  WBC 25.2* 21.2*  --  19.6* 16.4*  NEUTROABS 23.2*  --   --   --   --   HGB 8.6* 7.3* 7.4* 7.5* 7.4*  HCT 26.1* 22.0* 22.7* 21.9* 23.0*  MCV 91.3 90.9  --  89.0 92.4  PLT 359 346  --  346 811*   Basic Metabolic Panel: Recent Labs  Lab 03/29/20 1127 03/30/20 0453 03/31/20 0530 04/01/20 0705  NA 133* 137 135 137  K 4.8 4.5 5.1 4.4  CL 100 105 106 109  CO2 22 23 24  21*  GLUCOSE 381* 160* 347* 96  BUN 37* 36* 39* 38*  CREATININE 2.74* 2.53* 2.43* 2.39*  CALCIUM 8.5* 8.5* 8.0* 8.4*  MG  --   --  2.1  --   PHOS  --   --  3.9  --    GFR: Estimated Creatinine Clearance: 24.8 mL/min (A) (by C-G formula based on SCr of 2.39 mg/dL (H)). Liver Function Tests: Recent Labs  Lab 03/29/20 1127 03/30/20 0453  AST 78* 30  ALT 55* 38  ALKPHOS 94 77  BILITOT 0.8 1.0  PROT 7.7 6.9  ALBUMIN 3.0* 2.6*   No results for input(s): LIPASE, AMYLASE in the last 168 hours. No results for input(s): AMMONIA in the last 168 hours. Coagulation Profile: Recent Labs  Lab 03/30/20 0453  INR 1.3*   Cardiac Enzymes: No results for input(s): CKTOTAL, CKMB, CKMBINDEX, TROPONINI in the last 168 hours. BNP (last 3 results) No results for input(s): PROBNP in the last 8760 hours. HbA1C: No results for input(s): HGBA1C in the last 72 hours. CBG: Recent Labs  Lab 04/01/20 0856 04/01/20 0925 04/01/20 0927 04/01/20 1008 04/01/20 1235  GLUCAP 69* 63* 68* 108* 135*   Lipid Profile: No results for input(s): CHOL, HDL, LDLCALC, TRIG, CHOLHDL, LDLDIRECT in the last 72 hours. Thyroid Function Tests: No results for input(s): TSH, T4TOTAL, FREET4, T3FREE, THYROIDAB in the last 72 hours. Anemia Panel: No results for input(s): VITAMINB12, FOLATE, FERRITIN, TIBC, IRON, RETICCTPCT in the last 72 hours. Sepsis Labs: Recent  Labs  Lab 03/29/20 1127 03/29/20 1551 03/30/20 0453  PROCALCITON  --   --  6.02  LATICACIDVEN 2.1* 1.4  --     Recent Results (  from the past 240 hour(s))  Aerobic Culture (superficial specimen)     Status: None   Collection Time: 03/26/20  3:10 PM   Specimen: Wound  Result Value Ref Range Status   Specimen Description   Final    WOUND Performed at [facility], [address] [place], [place] [address]    Special Requests   Final    LEFT FOOT Performed at [name], [address] [place], [place] [address]    Gram Stain   Final    NO WBC SEEN MODERATE GRAM POSITIVE COCCI FEW GRAM NEGATIVE RODS    Culture   Final    FEW METHICILLIN RESISTANT STAPHYLOCOCCUS AUREUS MIXED WITHIN OTHER ORGANISMS Performed at [facility], [place] [address] [place], [place] [address]    Report Status 03/29/2020 FINAL  Final   Organism ID, Bacteria METHICILLIN RESISTANT STAPHYLOCOCCUS AUREUS  Final      Susceptibility   Methicillin resistant staphylococcus aureus - MIC*    CIPROFLOXACIN >=8 RESISTANT Resistant     ERYTHROMYCIN >=8 RESISTANT Resistant     GENTAMICIN <=0.5 SENSITIVE Sensitive     OXACILLIN >=4 RESISTANT Resistant     TETRACYCLINE <=1 SENSITIVE Sensitive     VANCOMYCIN 1 SENSITIVE Sensitive     TRIMETH/SULFA <=10 SENSITIVE Sensitive     CLINDAMYCIN 1 INTERMEDIATE Intermediate     RIFAMPIN 16 RESISTANT Resistant     Inducible Clindamycin NEGATIVE Sensitive     * FEW METHICILLIN RESISTANT STAPHYLOCOCCUS AUREUS  Blood culture (routine x 2)     Status: None (Preliminary result)   Collection Time: 03/29/20  3:51 PM   Specimen: BLOOD  Result Value Ref Range Status   Specimen Description BLOOD BLOOD RIGHT HAND  Final   Special Requests   Final    BOTTLES DRAWN AEROBIC AND ANAEROBIC Blood Culture adequate volume   Culture   Final    NO GROWTH 3 DAYS Performed at [facility], [address], [place], [place] [address]    Report Status  PENDING  Incomplete  Blood culture (routine x 2)     Status: None (Preliminary result)   Collection Time: 03/29/20  3:51 PM   Specimen: BLOOD  Result Value Ref Range Status   Specimen Description BLOOD LEFT ANTECUBITAL  Final   Special Requests   Final    BOTTLES DRAWN AEROBIC AND ANAEROBIC Blood Culture adequate volume   Culture   Final    NO GROWTH 3 DAYS Performed at [facility], [address], [place], [place] [address]    Report Status PENDING  Incomplete  SARS Coronavirus 2 by RT PCR (hospital order, performed in [place] hospital lab) Nasopharyngeal Nasopharyngeal Swab     Status: None   Collection Time: 03/29/20  5:15 PM   Specimen: Nasopharyngeal Swab  Result Value Ref Range Status   SARS Coronavirus 2 NEGATIVE NEGATIVE Final    Comment: (NOTE) SARS-CoV-2 target nucleic acids are NOT DETECTED.  The SARS-CoV-2 RNA is generally detectable in upper and lower respiratory specimens during the acute phase of infection. The lowest concentration of SARS-CoV-2 viral copies this assay can detect is 250 copies / mL. A negative result does not preclude SARS-CoV-2 infection and should not be used as the sole basis for treatment or other patient management decisions.  A negative result may occur with improper specimen collection / handling, submission of specimen other than nasopharyngeal swab, presence of viral mutation(s) within the areas targeted by this assay, and  inadequate number of viral copies (<250 copies / mL). A negative result must be combined with clinical observations, patient history, and epidemiological information.  Fact Sheet for Patients:   StrictlyIdeas.no  Fact Sheet for Healthcare Providers: BankingDealers.co.za  This test is not yet approved or  cleared by the [place] FDA and has been authorized for detection and/or diagnosis of SARS-CoV-2 by FDA under an Emergency Use Authorization (EUA).   This EUA will remain in effect (meaning this test can be used) for the duration of the COVID-19 declaration under Section 564(b)(1) of the Act, 21 U.S.C. section 360bbb-3(b)(1), unless the authorization is terminated or revoked sooner.  Performed at [facility], [address] [place], [place] [address]   Aerobic/Anaerobic Culture (surgical/deep wound)     Status: None (Preliminary result)   Collection Time: 03/30/20  1:19 PM   Specimen: PATH Other; Tissue  Result Value Ref Range Status   Specimen Description   Final    WOUND Performed at [facility], [address] [place], [place] [address]    Special Requests   Final    LEFT 5TH TOE JOINT INFECTION Performed at [facility], [address] [place], [place] [address]    Gram Stain   Final    NO WBC SEEN RARE GRAM POSITIVE COCCI IN PAIRS Performed at [facility], [place] [address], [place], [place] [address]    Culture   Final    FEW ESCHERICHIA COLI NO ANAEROBES ISOLATED; CULTURE IN PROGRESS FOR 5 DAYS    Report Status PENDING  Incomplete   Organism ID, Bacteria ESCHERICHIA COLI  Final      Susceptibility   Escherichia coli - MIC*    AMPICILLIN >=32 RESISTANT Resistant     CEFAZOLIN <=4 SENSITIVE Sensitive     CEFEPIME <=0.12 SENSITIVE Sensitive     CEFTAZIDIME <=1 SENSITIVE Sensitive     CEFTRIAXONE <=0.25 SENSITIVE Sensitive     CIPROFLOXACIN <=0.25 SENSITIVE Sensitive     GENTAMICIN >=16 RESISTANT Resistant     IMIPENEM <=0.25 SENSITIVE Sensitive     TRIMETH/SULFA >=320 RESISTANT Resistant     AMPICILLIN/SULBACTAM >=32 RESISTANT Resistant     PIP/TAZO <=4 SENSITIVE Sensitive     * FEW ESCHERICHIA COLI         Radiology Studies: No results found.   Scheduled Meds: . amLODipine  10 mg Oral Daily  . aspirin EC  81 mg Oral Daily  . atenolol  100 mg Oral BID  . calcitRIOL  0.25 mcg Oral Daily  . cloNIDine  0.2 mg Oral BID  . docusate sodium  100 mg Oral BID  .  enoxaparin (LOVENOX) injection  30 mg Subcutaneous Q24H  . famotidine  20 mg Oral Daily  . ferrous sulfate  325 mg Oral BID WC  . FLUoxetine  20 mg Oral BID  . insulin aspart  0-5 Units Subcutaneous QHS  . insulin aspart  0-9 Units Subcutaneous TID WC  . insulin detemir  55 Units Subcutaneous Daily  . senna  1 tablet Oral BID  . simvastatin  20 mg Oral QPM   Continuous Infusions: . sodium chloride 50 mL/hr at 04/01/20 1052  . ceFEPime (MAXIPIME) IV Stopped (03/31/20 2143)  . metronidazole Stopped (04/01/20 3846)  . vancomycin Stopped (03/31/20 1855)     LOS: 3 days    Time spent: 25 mins.    Shawna Clamp, MD [facility]   If 7PM-7AM, please contact night-coverage

## 2020-04-01 NOTE — Treatment Plan (Signed)
MD Dwyane Dee notified of hypoglycemic episodes. Advised to hold 55 units Levemir since patient sugar has been low this morning. Medication held per verbal order from MD.

## 2020-04-02 ENCOUNTER — Ambulatory Visit: Payer: Medicare HMO | Admitting: Physician Assistant

## 2020-04-02 DIAGNOSIS — C50212 Malignant neoplasm of upper-inner quadrant of left female breast: Secondary | ICD-10-CM

## 2020-04-02 DIAGNOSIS — Z170 Estrogen receptor positive status [ER+]: Secondary | ICD-10-CM

## 2020-04-02 LAB — BASIC METABOLIC PANEL
Anion gap: 7 (ref 5–15)
BUN: 33 mg/dL — ABNORMAL HIGH (ref 8–23)
CO2: 21 mmol/L — ABNORMAL LOW (ref 22–32)
Calcium: 8.3 mg/dL — ABNORMAL LOW (ref 8.9–10.3)
Chloride: 112 mmol/L — ABNORMAL HIGH (ref 98–111)
Creatinine, Ser: 2.1 mg/dL — ABNORMAL HIGH (ref 0.44–1.00)
GFR calc Af Amer: 28 mL/min — ABNORMAL LOW (ref >60)
GFR calc non Af Amer: 24 mL/min — ABNORMAL LOW (ref >60)
Glucose, Bld: 140 mg/dL — ABNORMAL HIGH (ref 70–99)
Potassium: 4.6 mmol/L (ref 3.5–5.1)
Sodium: 140 mmol/L (ref 135–145)

## 2020-04-02 LAB — CBC
HCT: 24.5 % — ABNORMAL LOW (ref 36.0–46.0)
Hemoglobin: 8.1 g/dL — ABNORMAL LOW (ref 12.0–15.0)
MCH: 30.1 pg (ref 26.0–34.0)
MCHC: 33.1 g/dL (ref 30.0–36.0)
MCV: 91.1 fL (ref 80.0–100.0)
Platelets: 450 10*3/uL — ABNORMAL HIGH (ref 150–400)
RBC: 2.69 MIL/uL — ABNORMAL LOW (ref 3.87–5.11)
RDW: 15.6 % — ABNORMAL HIGH (ref 11.5–15.5)
WBC: 11.5 10*3/uL — ABNORMAL HIGH (ref 4.0–10.5)
nRBC: 0.4 % — ABNORMAL HIGH (ref 0.0–0.2)

## 2020-04-02 LAB — GLUCOSE, CAPILLARY
Glucose-Capillary: 131 mg/dL — ABNORMAL HIGH (ref 70–99)
Glucose-Capillary: 206 mg/dL — ABNORMAL HIGH (ref 70–99)
Glucose-Capillary: 199 mg/dL — ABNORMAL HIGH (ref 70–99)
Glucose-Capillary: 196 mg/dL — ABNORMAL HIGH (ref 70–99)

## 2020-04-02 LAB — VANCOMYCIN, TROUGH: Vancomycin Tr: 10 ug/mL — ABNORMAL LOW (ref 15–20)

## 2020-04-02 LAB — MAGNESIUM: Magnesium: 2.2 mg/dL (ref 1.7–2.4)

## 2020-04-02 LAB — PHOSPHORUS: Phosphorus: 2.6 mg/dL (ref 2.5–4.6)

## 2020-04-02 LAB — MRSA PCR SCREENING: MRSA by PCR: NEGATIVE

## 2020-04-02 MED ORDER — CEFAZOLIN SODIUM-DEXTROSE 1-4 GM/50ML-% IV SOLN
1.0000 g | Freq: Two times a day (BID) | INTRAVENOUS | Status: DC
  Administered 2020-04-02 – 2020-04-03 (×2): 1 g via INTRAVENOUS
  Filled 2020-04-02 (×5): qty 50

## 2020-04-02 NOTE — Progress Notes (Signed)
PROGRESS NOTE    [name]  FTD:[id number] DOB: September 16, 1955 DOA: 03/29/2020 PCP: [name], MD   Brief Narrative: [name] is a 64 y.o. female with medical history significant of metastatic breast cancer, diabetes mellitus, CKD stage III, CHF, presents in the emergency department with open draining left foot wound and associated fever. 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 Xray Left foot: Soft tissue swelling and subcutaneous air consistent with localized infection predominately about the fifth MTP joint.  No definitive erosive changes are noted. If clinically indicated MRI may be helpful for further evaluation.  Patient was admitted for sepsis secondary to infected left foot wound,  MRI consistent with osteomyelitis.  Podiatry consulted, Patient underwent fifth ray amputation,  tolerated well.  Patient is continued on IV antibiotics,   Podiatry is following , wound care needs to be arranged.  Infectious diseases consulted to to guide antibiotic regimen.  ID recommended continue vancomycin for known MRSA and Flagyl and cefepime.  Assessment & Plan:   Principal Problem:   Sepsis ([place]) Active Problems:   Asthma   Benign essential HTN   Chronic kidney disease (CKD), stage V (HCC)   Chronic systolic CHF (congestive heart failure) (HCC)   Depression with anxiety   Type 2 diabetes mellitus with diabetic chronic kidney disease (HCC)   MI (mitral incompetence)   Carcinoma of upper-inner quadrant of left breast in female, estrogen receptor positive ([place])   Metastasis from malignant tumor of breast (HCC)   Anemia of chronic disease   Sepsis secondary to infected Left foot wound: POA: temp 101.9  HR 92 RR 18 BP 134/75 lactic acid 2.1.  Leukocytosis 25.5  Source foot infection. Started sepsis order set. She received IV fluids in the ED, Lactic acid normalized. Given Vanco, cefepime and Flagyl in the  ED. We will continue vancomycin and cefepime,  pharmacy consulted. Adequate pain control with oxycodone as needed. Continue gentle hydration. Podiatry consulted,  MRI consistent with osteomyelitis. Patient underwent fifth ray amputation tolerated well. Infectious disease consulted to guide about antibiotic regimen. Continue vancomycin for known MRSA and continue cefepime and Flagyl. Wound care consult.  AKI on CKD stage III:  >>> Improving Baseline creatinine remains around 2.1.   Avoid nephrotoxic medications, continue gentle hydration,  recheck labs tomorrow morning.  Anemia of chronic disease: No obvious bleeding, hemoglobin is stable.  Hypertension:  Continue home medications.  Diabetes mellitus Continue Lantus 55 units at night. Sliding scale,  obtain hemoglobin A1c.  Elevated LFTs:  Could be due to metastatic lesion. Improved.  Metastatic breast cancer; She follows up outpatient with oncologist,  Hem / Oncology consulted.  Depression with anxiety: Resume home meds   DVT prophylaxis:  Lovenox Code Status: Full Family Communication: Discussed with patient,  Nobody at bedside. Disposition Plan: Anticipated discharge to SNF  Consults called: Podiatrist Patient is not medically clear awaiting cultures and antibiotic regimen at discharge.   Consultants:    Podiatry   Procedures:  Partial 5th ray amputation left foot Antimicrobials: Anti-infectives (From admission, onward)   Start     Dose/Rate Route Frequency Ordered Stop   04/02/20 1000  vancomycin (VANCOCIN) IVPB 1000 mg/200 mL premix  Status:  Discontinued        1,000 mg 200 mL/hr over 60 Minutes Intravenous Every 48 hours 03/31/20 1041 03/31/20 1042   03/31/20 1800  vancomycin (VANCOCIN) IVPB 1000 mg/200 mL premix        1,000  mg 200 mL/hr over 60 Minutes Intravenous Every 48 hours 03/31/20 1042     03/31/20 1000  vancomycin (VANCOREADY) IVPB 750 mg/150 mL  Status:  Discontinued        750  mg 150 mL/hr over 60 Minutes Intravenous Every 48 hours 03/30/20 1327 03/31/20 1041   03/30/20 2200  metroNIDAZOLE (FLAGYL) IVPB 500 mg        500 mg 100 mL/hr over 60 Minutes Intravenous Every 8 hours 03/30/20 1722     03/30/20 2000  ceFEPIme (MAXIPIME) 2 g in sodium chloride 0.9 % 100 mL IVPB        2 g 200 mL/hr over 30 Minutes Intravenous Every 24 hours 03/30/20 1723     03/30/20 1800  vancomycin (VANCOCIN) IVPB 1000 mg/200 mL premix  Status:  Discontinued        1,000 mg 200 mL/hr over 60 Minutes Intravenous Every 24 hours 03/29/20 1735 03/30/20 1327   03/30/20 1400  ceFEPIme (MAXIPIME) 2 g in sodium chloride 0.9 % 100 mL IVPB  Status:  Discontinued        2 g 200 mL/hr over 30 Minutes Intravenous Every 24 hours 03/29/20 1735 03/30/20 1323   03/29/20 1700  vancomycin (VANCOCIN) IVPB 1000 mg/200 mL premix  Status:  Discontinued        1,000 mg 200 mL/hr over 60 Minutes Intravenous  Once 03/29/20 1658 03/29/20 1700   03/29/20 1630  vancomycin (VANCOREADY) IVPB 500 mg/100 mL        500 mg 100 mL/hr over 60 Minutes Intravenous  Once 03/29/20 1541 03/29/20 1834   03/29/20 1545  metroNIDAZOLE (FLAGYL) IVPB 500 mg  Status:  Discontinued        500 mg 100 mL/hr over 60 Minutes Intravenous Every 8 hours 03/29/20 1534 03/29/20 1714   03/29/20 1530  ceFEPIme (MAXIPIME) 2 g in sodium chloride 0.9 % 100 mL IVPB        2 g 200 mL/hr over 30 Minutes Intravenous  Once 03/29/20 1522 03/29/20 1631   03/29/20 1530  vancomycin (VANCOCIN) IVPB 1000 mg/200 mL premix        1,000 mg 200 mL/hr over 60 Minutes Intravenous  Once 03/29/20 1522 03/29/20 1834      Subjective: Patient was seen and examined at bedside.  No overnight events.  She reports feeling better.   She asks when she could be discharged. Objective: Vitals:   04/01/20 2105 04/02/20 0500 04/02/20 0739 04/02/20 1148  BP: (!) 154/81 (!) 148/80 (!) 159/88 (!) 158/95  Pulse: 76 74 77 76  Resp: 20 20 20 16   Temp: 97.6 F (36.4 C) 97.9  F (36.6 C)  98 F (36.7 C)  TempSrc: Oral Oral    SpO2: 95% 96% 97% 98%  Weight:      Height:        Intake/Output Summary (Last 24 hours) at 04/02/2020 1406 Last data filed at 04/02/2020 0742 Gross per 24 hour  Intake 860.57 ml  Output 0 ml  Net 860.57 ml   Filed Weights   03/29/20 1119  Weight: 88 kg    Examination:  General exam: Appears calm and comfortable  Respiratory system: Clear to auscultation. Respiratory effort normal. Cardiovascular system: S1 & S2 heard, RRR. No JVD, murmurs, rubs, gallops or clicks. No pedal edema. Gastrointestinal system: Abdomen is nondistended, soft and nontender. No organomegaly or masses felt. Normal bowel sounds heard. Central nervous system: Alert and oriented. No focal neurological deficits. Extremities:  Left partial toe amputation,  covered in dressing. Skin: No rashes, lesions or ulcers Psychiatry: Judgement and insight appear normal. Mood & affect appropriate.     Data Reviewed: I have personally reviewed following labs and imaging studies  CBC: Recent Labs  Lab 03/29/20 1127 03/29/20 1127 03/30/20 0453 03/30/20 1658 03/31/20 0530 04/01/20 0705 04/02/20 0710  WBC 25.2*  --  21.2*  --  19.6* 16.4* 11.5*  NEUTROABS 23.2*  --   --   --   --   --   --   HGB 8.6*   < > 7.3* 7.4* 7.5* 7.4* 8.1*  HCT 26.1*   < > 22.0* 22.7* 21.9* 23.0* 24.5*  MCV 91.3  --  90.9  --  89.0 92.4 91.1  PLT 359  --  346  --  346 433* 450*   < > = values in this interval not displayed.   Basic Metabolic Panel: Recent Labs  Lab 03/29/20 1127 03/30/20 0453 03/31/20 0530 04/01/20 0705 04/02/20 0710  NA 133* 137 135 137 140  K 4.8 4.5 5.1 4.4 4.6  CL 100 105 106 109 112*  CO2 22 23 24  21* 21*  GLUCOSE 381* 160* 347* 96 140*  BUN 37* 36* 39* 38* 33*  CREATININE 2.74* 2.53* 2.43* 2.39* 2.10*  CALCIUM 8.5* 8.5* 8.0* 8.4* 8.3*  MG  --   --  2.1  --  2.2  PHOS  --   --  3.9  --  2.6   GFR: Estimated Creatinine Clearance: 28.3 mL/min (A) (by  C-G formula based on SCr of 2.1 mg/dL (H)). Liver Function Tests: Recent Labs  Lab 03/29/20 1127 03/30/20 0453  AST 78* 30  ALT 55* 38  ALKPHOS 94 77  BILITOT 0.8 1.0  PROT 7.7 6.9  ALBUMIN 3.0* 2.6*   No results for input(s): LIPASE, AMYLASE in the last 168 hours. No results for input(s): AMMONIA in the last 168 hours. Coagulation Profile: Recent Labs  Lab 03/30/20 0453  INR 1.3*   Cardiac Enzymes: No results for input(s): CKTOTAL, CKMB, CKMBINDEX, TROPONINI in the last 168 hours. BNP (last 3 results) No results for input(s): PROBNP in the last 8760 hours. HbA1C: No results for input(s): HGBA1C in the last 72 hours. CBG: Recent Labs  Lab 04/01/20 1235 04/01/20 1647 04/01/20 2053 04/02/20 0747 04/02/20 1148  GLUCAP 135* 146* 140* 131* 206*   Lipid Profile: No results for input(s): CHOL, HDL, LDLCALC, TRIG, CHOLHDL, LDLDIRECT in the last 72 hours. Thyroid Function Tests: No results for input(s): TSH, T4TOTAL, FREET4, T3FREE, THYROIDAB in the last 72 hours. Anemia Panel: No results for input(s): VITAMINB12, FOLATE, FERRITIN, TIBC, IRON, RETICCTPCT in the last 72 hours. Sepsis Labs: Recent Labs  Lab 03/29/20 1127 03/29/20 1551 03/30/20 0453  PROCALCITON  --   --  6.02  LATICACIDVEN 2.1* 1.4  --     Recent Results (from the past 240 hour(s))  Aerobic Culture (superficial specimen)     Status: None   Collection Time: 03/26/20  3:10 PM   Specimen: Wound  Result Value Ref Range Status   Specimen Description   Final    WOUND Performed at [facility], [address], [place], [place] [address]    Special Requests   Final    LEFT FOOT Performed at [facility], [address] [place],  [address]    Gram Stain   Final    NO WBC SEEN MODERATE GRAM POSITIVE COCCI FEW GRAM NEGATIVE RODS    Culture   Final  FEW METHICILLIN RESISTANT STAPHYLOCOCCUS AUREUS MIXED WITHIN OTHER ORGANISMS Performed at [facility],  [address], [place], [place] [address]    Report Status 03/29/2020 FINAL  Final   Organism ID, Bacteria METHICILLIN RESISTANT STAPHYLOCOCCUS AUREUS  Final      Susceptibility   Methicillin resistant staphylococcus aureus - MIC*    CIPROFLOXACIN >=8 RESISTANT Resistant     ERYTHROMYCIN >=8 RESISTANT Resistant     GENTAMICIN <=0.5 SENSITIVE Sensitive     OXACILLIN >=4 RESISTANT Resistant     TETRACYCLINE <=1 SENSITIVE Sensitive     VANCOMYCIN 1 SENSITIVE Sensitive     TRIMETH/SULFA <=10 SENSITIVE Sensitive     CLINDAMYCIN 1 INTERMEDIATE Intermediate     RIFAMPIN 16 RESISTANT Resistant     Inducible Clindamycin NEGATIVE Sensitive     * FEW METHICILLIN RESISTANT STAPHYLOCOCCUS AUREUS  Blood culture (routine x 2)     Status: None (Preliminary result)   Collection Time: 03/29/20  3:51 PM   Specimen: BLOOD  Result Value Ref Range Status   Specimen Description BLOOD BLOOD RIGHT HAND  Final   Special Requests   Final    BOTTLES DRAWN AEROBIC AND ANAEROBIC Blood Culture adequate volume   Culture   Final    NO GROWTH 4 DAYS Performed at [facility], [address] [place], [place] [address]    Report Status PENDING  Incomplete  Blood culture (routine x 2)     Status: None (Preliminary result)   Collection Time: 03/29/20  3:51 PM   Specimen: BLOOD  Result Value Ref Range Status   Specimen Description BLOOD LEFT ANTECUBITAL  Final   Special Requests   Final    BOTTLES DRAWN AEROBIC AND ANAEROBIC Blood Culture adequate volume   Culture   Final    NO GROWTH 4 DAYS Performed at [facility], [address] [place], [place] [address]    Report Status PENDING  Incomplete  SARS Coronavirus 2 by RT PCR (hospital order, performed in [facility] hospital lab) Nasopharyngeal Nasopharyngeal Swab     Status: None   Collection Time: 03/29/20  5:15 PM   Specimen: Nasopharyngeal Swab  Result Value Ref Range Status   SARS Coronavirus 2 NEGATIVE NEGATIVE Final    Comment:  (NOTE) SARS-CoV-2 target nucleic acids are NOT DETECTED.  The SARS-CoV-2 RNA is generally detectable in upper and lower respiratory specimens during the acute phase of infection. The lowest concentration of SARS-CoV-2 viral copies this assay can detect is 250 copies / mL. A negative result does not preclude SARS-CoV-2 infection and should not be used as the sole basis for treatment or other patient management decisions.  A negative result may occur with improper specimen collection / handling, submission of specimen other than nasopharyngeal swab, presence of viral mutation(s) within the areas targeted by this assay, and inadequate number of viral copies (<250 copies / mL). A negative result must be combined with clinical observations, patient history, and epidemiological information.  Fact Sheet for Patients:   StrictlyIdeas.no  Fact Sheet for Healthcare Providers: BankingDealers.co.za  This test is not yet approved or  cleared by the [place] FDA and has been authorized for detection and/or diagnosis of SARS-CoV-2 by FDA under an Emergency Use Authorization (EUA).  This EUA will remain in effect (meaning this test can be used) for the duration of the COVID-19 declaration under Section 564(b)(1) of the Act, 21 U.S.C. section 360bbb-3(b)(1), unless the authorization is terminated or revoked sooner.  Performed at [facility], [address], [place], [place] [address]   Aerobic/Anaerobic Culture (surgical/deep wound)     Status: None (Preliminary result)   Collection Time: 03/30/20  1:19 PM   Specimen: PATH Other; Tissue  Result Value Ref Range Status   Specimen Description   Final    WOUND Performed at [facility], [address], [place], [place] [address]    Special Requests   Final    LEFT 5TH TOE JOINT INFECTION Performed at [facility], [address] [place] [place], [place] [address]     Gram Stain   Final    NO WBC SEEN RARE GRAM POSITIVE COCCI IN PAIRS Performed at [facility], [place] [address] [place], [place] [address]    Culture   Final    FEW ESCHERICHIA COLI NO ANAEROBES ISOLATED; CULTURE IN PROGRESS FOR 5 DAYS    Report Status PENDING  Incomplete   Organism ID, Bacteria ESCHERICHIA COLI  Final      Susceptibility   Escherichia coli - MIC*    AMPICILLIN >=32 RESISTANT Resistant     CEFAZOLIN <=4 SENSITIVE Sensitive     CEFEPIME <=0.12 SENSITIVE Sensitive     CEFTAZIDIME <=1 SENSITIVE Sensitive     CEFTRIAXONE <=0.25 SENSITIVE Sensitive     CIPROFLOXACIN <=0.25 SENSITIVE Sensitive     GENTAMICIN >=16 RESISTANT Resistant     IMIPENEM <=0.25 SENSITIVE Sensitive     TRIMETH/SULFA >=320 RESISTANT Resistant     AMPICILLIN/SULBACTAM >=32 RESISTANT Resistant     PIP/TAZO <=4 SENSITIVE Sensitive     * FEW ESCHERICHIA COLI  MRSA PCR Screening     Status: None   Collection Time: 04/02/20  8:11 AM   Specimen: Nasopharyngeal  Result Value Ref Range Status   MRSA by PCR NEGATIVE NEGATIVE Final    Comment:        The GeneXpert MRSA Assay (FDA approved for NASAL specimens only), is one component of a comprehensive MRSA colonization surveillance program. It is not intended to diagnose MRSA infection nor to guide or monitor treatment for MRSA infections. Performed at [facility], [address] [place],  [address]          Radiology Studies: No results found.   Scheduled Meds: . amLODipine  10 mg Oral Daily  . aspirin EC  81 mg Oral Daily  . atenolol  100 mg Oral BID  . calcitRIOL  0.25 mcg Oral Daily  . cloNIDine  0.2 mg Oral BID  . docusate sodium  100 mg Oral BID  . enoxaparin (LOVENOX) injection  30 mg Subcutaneous Q24H  . famotidine  20 mg Oral Daily  . ferrous sulfate  325 mg Oral BID WC  . FLUoxetine  20 mg Oral BID  . insulin aspart  0-5 Units Subcutaneous QHS  . insulin aspart  0-9 Units Subcutaneous TID WC  .  insulin detemir  55 Units Subcutaneous Daily  . senna  1 tablet Oral BID  . simvastatin  20 mg Oral QPM   Continuous Infusions: . ceFEPime (MAXIPIME) IV Stopped (04/01/20 2132)  . metronidazole 500 mg (04/02/20 1330)  . vancomycin Stopped (03/31/20 1855)     LOS: 4 days    Time spent: 25 mins.    Shawna Clamp, MD [facility]   If 7PM-7AM, please contact night-coverage

## 2020-04-02 NOTE — Assessment & Plan Note (Addendum)
#  64 year old female patient with a history of metastatic breast cancer CKD stage IV; poorly controlled diabetes is currently admitted to hospital for left foot cellulitis/osteomyelitis  #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 [March 25, 2020]-shows progressive disease adrenals/bone/pleural parenchymal-consistent with recently rising tumor marker; discussed that we will plan to start chemotherapy in approximately 3 weeks.  Patient is very nervous regarding holding chemotherapy.  See below  #Left lower extremity infection-cellulitis/osteomyelitis status post amputation-on broad-spectrum antibiotics; patient status post [place] catheter-for IV vancomycin antibiotics until September 24th as per ID.   # Chronic kidney disease - stage III-GFR 35-stable discussed with Dr. Juleen China.   # Anemia- hemoglobin today-8-9/CKD-stable on PO iron.  #Covid vaccination: S/p first vaccination in the hospital.  She will need second dose in about 3 weeks.  Again reviewed the potential side effects of vaccination.  #Prognosis: I had a long discussion the patient that unfortunately she will eventually die of her progressive breast cancer sooner or later whether we did the treatment or not.  However I am extremely concerned about offering chemotherapy prematurely while being treated for active infection.  Discussed my concerns that infection/sepsis sometimes might kill her sooner than the underlying progressive cancer.  She understands that we will take due diligence/clearance from podiatry and also ID before proceeding with chemotherapy.  We will have the patient follow-up in approximately 2 weeks; to reassess her status of infection; and plan chemotherapy/gemcitabine the following week or so.  # 35  minutes face-to-face with the patient discussing the above plan of care; more than 50% of time spent on prognosis/ natural history; counseling and coordination.

## 2020-04-02 NOTE — TOC Progression Note (Signed)
Transition of Care [facility]) - Progression Note    Patient Details  Name: [name] MRN: [id number] Date of Birth: 1955-12-19  [facility] CM/SW Contact  Beverly Sessions, RN Phone Number: 04/02/2020, 1:38 PM  Clinical Narrative:     TOC reached out to [facility] to request the review the referral .  They are not sure with the will be able to offer with a wound vac in place.  They will update me with their decision.  Discussed with patient.  Patient agreeable to extend the bed search, but does not want the referral sent to [facility].   Bed Search extended  Patient states "I want to be clear, I'm not sure if I am going to a facility yet or not, I will let you know"  TOC notified patient that if she goes home, home health and home wound vac would have to be arranged.   MD and bedside RN notified   Expected Discharge Plan: Skilled Nursing Facility Barriers to Discharge: No Barriers Identified  Expected Discharge Plan and Services Expected Discharge Plan: [place] In-house Referral: NA Discharge Planning Services: NA Post Acute Care Choice: [place] arrangements for the past 2 months: Single Family Home                 DME Arranged: N/A DME Agency: [facility]       HH Arranged: NA HH Agency: [facility] [employer])         Social Determinants of Health ([place]) Interventions    Readmission Risk Interventions Readmission Risk Prevention Plan 04/01/2020  Transportation Screening Complete  Medication Review Press photographer) Complete  PCP or Specialist appointment within 3-5 days of discharge Complete  HRI or [place] Complete  SW Recovery Care/Counseling Consult Complete  [place] Not Applicable  Some recent data might be hidden

## 2020-04-02 NOTE — Consult Note (Signed)
[place] CONSULT NOTE  Patient Care Team: [name], [name], MD as Medical Oncologist (Medical Oncology) Corey Skains, MD as Consulting Physician (Cardiology)  CHIEF COMPLAINTS/PURPOSE OF CONSULTATION: Metastatic breast cancer  HISTORY OF PRESENTING ILLNESS:  [name] 64 y.o.  female patient with metastatic breast cancer to lung/bone ER/PR positive HER-2 negative status post multiple chemotherapy, chronic kidney disease stage IV; poorly controlled diabetes, is currently admitted to hospital for lower extremity cellulitis.  Patient also had high-grade fever 102 and chills.  Patient had further work-up showed-osteomyelitis of the left-metatarsal/fifth toe.  Patient is currently status post amputation.  Patient currently under wound VAC.  Currently on broad-spectrum antibiotics.  With regards to metastatic ER/PR positive HER-2 negative breast cancer-patient is status post multiple lines of therapy.  Patient most recently on eribulin chemotherapy.  Patient of note had a PET scan in March 26, 2020-which unfortunately shows progression of disease pleural-parenchymal; increased SUV uptake noted on bone lesions; also increased uptake of adrenal lesions.  This corresponds with the increasing tumor markers.  Patient's chemotherapy will send approximately 2 weeks ago infection issues.  Patient currently denies any fevers or chills.  Admits to improvement of swelling in pain of the lower extremity.  Review of Systems  Constitutional: Positive for chills, fever and malaise/fatigue. Negative for diaphoresis and weight loss.  HENT: Negative for nosebleeds and sore throat.   Eyes: Negative for double vision.  Respiratory: Positive for shortness of breath. Negative for cough, hemoptysis, sputum production and wheezing.   Cardiovascular: Positive for leg swelling. Negative for chest pain, palpitations and orthopnea.   Gastrointestinal: Negative for abdominal pain, blood in stool, constipation, diarrhea, heartburn, melena, nausea and vomiting.  Genitourinary: Negative for dysuria, frequency and urgency.  Musculoskeletal: Positive for back pain and joint pain.  Skin: Negative.  Negative for itching and rash.  Neurological: Positive for tingling. Negative for dizziness, focal weakness, weakness and headaches.  Endo/Heme/Allergies: Does not bruise/bleed easily.  Psychiatric/Behavioral: Negative for depression. The patient is not nervous/anxious and does not have insomnia.      MEDICAL HISTORY:  Past Medical History:  Diagnosis Date  . Anemia   . Anxiety   . Asthma   . Cancer ([place]) 03/10/2018   Per NM PET order. Carcinoma of upper-inner quadrant of left breast in female, estrogen receptor positive .  Marland Kitchen Cancer (HCC)    LUNG  . CHF (congestive heart failure) ([place]) 1997  . CKD (chronic kidney disease)   . Depression   . Diabetes mellitus, type 2 ([place])   . Family history of breast cancer   . Family history of colon cancer   . Family history of ovarian cancer   . Family history of pancreatic cancer   . Family history of prostate cancer   . Family history of stomach cancer   . GERD (gastroesophageal reflux disease)    history of an ulcer  . Hair loss   . History of left breast cancer 05/29/14  . History of partial hysterectomy 12/31/2016   Per patient.  Has not had a period in years.  Had a partial hysterectomy years ago.  Marland Kitchen Hypertension   . Mitral valve regurgitation   . Neuromuscular disorder (HCC)    neuropathies in hand  . Obesity   . Pancreatitis 1997  . Stroke [facility]) 2010   with mild left arm weakness    SURGICAL HISTORY: Past Surgical History:  Procedure Laterality Date  .  AMPUTATION Left 03/30/2020   Procedure: AMPUTATION 5th RAY;  Surgeon: Samara Deist, DPM;  Location: [facility] ORS;  Service: Podiatry;  Laterality: Left;  . APPLICATION OF WOUND VAC Left 03/30/2020   Procedure:  APPLICATION OF WOUND VAC;  Surgeon: Samara Deist, DPM;  Location: [facility] ORS;  Service: Podiatry;  Laterality: Left;  . CATARACT EXTRACTION W/PHACO Right 02/24/2019   Procedure: CATARACT EXTRACTION PHACO AND INTRAOCULAR LENS PLACEMENT ([place]) RIGHT DIABETES;  Surgeon: Marchia Meiers, MD;  Location: [facility] ORS;  Service: Ophthalmology;  Laterality: Right;  [place] 01:13.0 CDE 7.96 Fluid Pack Lot # [id number]  . CATARACT EXTRACTION W/PHACO Left 03/24/2019   Procedure: CATARACT EXTRACTION PHACO AND INTRAOCULAR LENS PLACEMENT ([facility]) - left diabetic;  Surgeon: Marchia Meiers, MD;  Location: [facility] ORS;  Service: Ophthalmology;  Laterality: Left;  [place]  01:36 CDE 13.93 Fluid pack lot # [id number]  . CESAREAN SECTION    . CHOLECYSTECTOMY    . EXCISION OF TONGUE LESION N/A 08/17/2018   Procedure: EXCISION OF TONGUE LESION WITH FROZEN SECTIONS;  Surgeon: Beverly Gust, MD;  Location: [facility] ORS;  Service: ENT;  Laterality: N/A;  . EYE SURGERY Right    cataract extraction  . IRRIGATION AND DEBRIDEMENT FOOT Left 03/30/2020   Procedure: IRRIGATION AND DEBRIDEMENT FOOT;  Surgeon: Samara Deist, DPM;  Location: [facility] ORS;  Service: Podiatry;  Laterality: Left;  . PARTIAL HYSTERECTOMY  12/31/2016   Per patient, she has not had a period in years since she had a partial hysterectomy.  Marland Kitchen PORTA CATH INSERTION    . TUBAL LIGATION      SOCIAL HISTORY: Social History   Socioeconomic History  . Marital status: Single    Spouse name: S.O.. .... keith  . Number of children: Not on file  . Years of education: Not on file  . Highest education level: Not on file  Occupational History    Comment: disabled  Tobacco Use  . Smoking status: Former Smoker    Packs/day: 0.50    Years: 1.00    Pack years: 0.50    Types: Cigarettes  . Smokeless tobacco: Never Used  Vaping Use  . Vaping Use: Never used  Substance and Sexual Activity  . Alcohol use: No    Alcohol/week: 0.0 standard drinks  . Drug use: No  . Sexual activity: Not on  file    Comment: quit 8 years ago  Other Topics Concern  . Not on file  Social History Narrative  . Not on file   Social Determinants of Health   Financial Resource Strain:   . Difficulty of Paying Living Expenses: Not on file  Food Insecurity:   . Worried About Charity fundraiser in the Last Year: Not on file  . Ran Out of Food in the Last Year: Not on file  Transportation Needs:   . Lack of Transportation (Medical): Not on file  . Lack of Transportation (Non-Medical): Not on file  Physical Activity:   . Days of Exercise per Week: Not on file  . Minutes of Exercise per Session: Not on file  Stress:   . Feeling of Stress : Not on file  Social Connections:   . Frequency of Communication with Friends and Family: Not on file  . Frequency of Social Gatherings with Friends and Family: Not on file  . Attends Religious Services: Not on file  . Active Member of Clubs or Organizations: Not on file  . Attends Archivist Meetings: Not on file  . Marital Status: Not  on file  Intimate Partner Violence:   . Fear of Current or Ex-Partner: Not on file  . Emotionally Abused: Not on file  . Physically Abused: Not on file  . Sexually Abused: Not on file    FAMILY HISTORY: Family History  Problem Relation Age of Onset  . Ovarian cancer Mother 38  . Diabetes Mother   . Hypertension Mother   . COPD Father   . Hypertension Father   . Colon cancer Father 50  . Diabetes Sister   . Breast cancer Sister 51       bilateral  . Diabetes Brother   . Leukemia Maternal Aunt   . Pancreatic cancer Paternal Aunt 71  . Pancreatic cancer Paternal Uncle   . Colon cancer Paternal Uncle   . Stomach cancer Maternal Grandfather 73  . Throat cancer Paternal Grandmother   . Breast cancer Maternal Aunt 80  . Colon cancer Maternal Aunt   . Bone cancer Maternal Aunt   . Breast cancer Paternal Aunt        dx >50  . Prostate cancer Paternal Uncle   . Pancreatic cancer Paternal Uncle   . Throat  cancer Paternal Uncle   . Lung cancer Paternal Uncle   . Stomach cancer Paternal Uncle   . Brain cancer Paternal Aunt   . Cancer Cousin        liver, kidney  . Prostate cancer Cousin        meastatic  . Lung cancer Other     ALLERGIES:  is allergic to chlorhexidine, fish-derived products, and sulfamethoxazole-trimethoprim.  MEDICATIONS:  Current Facility-Administered Medications  Medication Dose Route Frequency Provider Last Rate Last Admin  . acetaminophen (TYLENOL) tablet 650 mg  650 mg Oral Q6H PRN Samara Deist, DPM       Or  . acetaminophen (TYLENOL) suppository 650 mg  650 mg Rectal Q6H PRN Samara Deist, DPM      . albuterol (PROVENTIL) (2.5 MG/3ML) 0.083% nebulizer solution 2.5 mg  2.5 mg Inhalation Q6H PRN Samara Deist, DPM      . ALPRAZolam Duanne Moron) tablet 0.5 mg  0.5 mg Oral BID PRN Samara Deist, DPM      . amLODipine (NORVASC) tablet 10 mg  10 mg Oral Daily Samara Deist, DPM   10 mg at 04/02/20 7035  . aspirin EC tablet 81 mg  81 mg Oral Daily Samara Deist, DPM   81 mg at 04/02/20 0093  . atenolol (TENORMIN) tablet 100 mg  100 mg Oral BID Samara Deist, DPM   100 mg at 04/02/20 0743  . budesonide (PULMICORT) nebulizer solution 0.5 mg  2 mL Inhalation BID PRN Samara Deist, DPM      . calcitRIOL (ROCALTROL) capsule 0.25 mcg  0.25 mcg Oral Daily Samara Deist, DPM   0.25 mcg at 04/02/20 0741  . ceFAZolin (ANCEF) IVPB 1 g/50 mL premix  1 g Intravenous Q12H Leonel Ramsay, MD      . cloNIDine (CATAPRES) tablet 0.2 mg  0.2 mg Oral BID Samara Deist, DPM   0.2 mg at 04/02/20 8182  . docusate sodium (COLACE) capsule 100 mg  100 mg Oral BID Samara Deist, DPM   100 mg at 04/02/20 0743  . enoxaparin (LOVENOX) injection 30 mg  30 mg Subcutaneous Q24H Samara Deist, DPM   30 mg at 04/01/20 2102  . famotidine (PEPCID) tablet 20 mg  20 mg Oral Daily Shawna Clamp, MD   20 mg at 04/02/20 0742  . ferrous sulfate tablet 325  mg  325 mg Oral BID WC Samara Deist, DPM    325 mg at 04/02/20 1736  . FLUoxetine (PROZAC) capsule 20 mg  20 mg Oral BID Samara Deist, DPM   20 mg at 04/02/20 0086  . insulin aspart (novoLOG) injection 0-5 Units  0-5 Units Subcutaneous QHS Samara Deist, DPM   2 Units at 03/30/20 2139  . insulin aspart (novoLOG) injection 0-9 Units  0-9 Units Subcutaneous TID WC Samara Deist, DPM   2 Units at 04/02/20 1735  . insulin detemir (LEVEMIR) injection 55 Units  55 Units Subcutaneous Daily Samara Deist, DPM   55 Units at 03/31/20 [phone]  . metroNIDAZOLE (FLAGYL) IVPB 500 mg  500 mg Intravenous Q8H Leonel Ramsay, MD 100 mL/hr at 04/02/20 1330 500 mg at 04/02/20 1330  . ondansetron (ZOFRAN) tablet 4 mg  4 mg Oral Q6H PRN Samara Deist, DPM       Or  . ondansetron [facility]) injection 4 mg  4 mg Intravenous Q6H PRN Samara Deist, DPM   4 mg at 04/02/20 0827  . oxyCODONE (Oxy IR/ROXICODONE) immediate release tablet 5 mg  5 mg Oral Q8H PRN Samara Deist, DPM   5 mg at 04/01/20 2103  . senna (SENOKOT) tablet 8.6 mg  1 tablet Oral BID Samara Deist, DPM   8.6 mg at 04/02/20 5093  . simvastatin (ZOCOR) tablet 20 mg  20 mg Oral QPM Samara Deist, DPM   20 mg at 04/02/20 1736  . vancomycin (VANCOCIN) IVPB 1000 mg/200 mL premix  1,000 mg Intravenous Q48H Oswald Hillock, [place]   Stopped at 03/31/20 2671   Facility-Administered Medications Ordered in Other Encounters  Medication Dose Route Frequency Provider Last Rate Last Admin  . sodium chloride flush (NS) 0.9 % injection 10 mL  10 mL Intravenous PRN Cammie Sickle, MD   10 mL at 01/30/16 1054      .  PHYSICAL EXAMINATION:  Vitals:   04/02/20 1148 04/02/20 2037  BP: (!) 158/95 (!) 154/90  Pulse: 76 76  Resp: 16 20  Temp: 98 F (36.7 C) 98.5 F (36.9 C)  SpO2: 98% 97%   Filed Weights   03/29/20 1119  Weight: 194 lb (88 kg)    Physical Exam HENT:     Head: Normocephalic and atraumatic.     Mouth/Throat:     Pharynx: No oropharyngeal exudate.  Eyes:     Pupils:  Pupils are equal, round, and reactive to light.  Cardiovascular:     Rate and Rhythm: Normal rate and regular rhythm.  Pulmonary:     Effort: No respiratory distress.     Breath sounds: No wheezing.     Comments: Decreased air entry bilaterally the bases. Abdominal:     General: Bowel sounds are normal. There is no distension.     Palpations: Abdomen is soft. There is no mass.     Tenderness: There is no abdominal tenderness. There is no guarding or rebound.  Musculoskeletal:        General: No tenderness. Normal range of motion.     Cervical back: Normal range of motion and neck supple.  Skin:    General: Skin is warm.  Neurological:     Mental Status: She is alert and oriented to person, place, and time.  Psychiatric:        Mood and Affect: Affect normal.      LABORATORY DATA:  I have reviewed the data as listed Lab Results  Component Value Date  WBC 11.5 (H) 04/02/2020   HGB 8.1 (L) 04/02/2020   HCT 24.5 (L) 04/02/2020   MCV 91.1 04/02/2020   PLT 450 (H) 04/02/2020   Recent Labs    03/16/20 0811 03/16/20 0811 03/29/20 1127 03/29/20 1127 03/30/20 0453 03/30/20 0453 03/31/20 0530 04/01/20 0705 04/02/20 0710  NA 133*   < > 133*   < > 137   < > 135 137 140  K 4.4   < > 4.8   < > 4.5   < > 5.1 4.4 4.6  CL 99   < > 100   < > 105   < > 106 109 112*  CO2 22   < > 22   < > 23   < > 24 21* 21*  GLUCOSE 211*   < > 381*   < > 160*   < > 347* 96 140*  BUN 36*   < > 37*   < > 36*   < > 39* 38* 33*  CREATININE 2.88*   < > 2.74*   < > 2.53*   < > 2.43* 2.39* 2.10*  CALCIUM 8.4*   < > 8.5*   < > 8.5*   < > 8.0* 8.4* 8.3*  GFRNONAA 17*   < > 18*   < > 20*   < > 20* 21* 24*  GFRAA 19*   < > 21*   < > 23*   < > 24* 24* 28*  PROT 7.0  --  7.7  --  6.9  --   --   --   --   ALBUMIN 3.0*  --  3.0*  --  2.6*  --   --   --   --   AST 15  --  78*  --  30  --   --   --   --   ALT 11  --  55*  --  38  --   --   --   --   ALKPHOS 75  --  94  --  77  --   --   --   --   BILITOT 0.5   --  0.8  --  1.0  --   --   --   --    < > = values in this interval not displayed.    RADIOGRAPHIC STUDIES: I have personally reviewed the radiological images as listed and agreed with the findings in the report. DG Chest 2 View  Result Date: 03/29/2020 CLINICAL DATA:  Shortness of breath and dizziness.  Breast carcinoma EXAM: CHEST - 2 VIEW COMPARISON:  Chest radiograph September 27, 2019; PET-CT March 28, 2020 FINDINGS: There are pleural effusions bilaterally with bibasilar atelectatic change. The masslike area in the lingula seen on recent PET study is appreciable by radiography measuring 1.4 x 1.3 cm. Lungs elsewhere are clear. Heart is upper normal in size with pulmonary vascularity normal. Port-A-Cath tip is in the superior vena cava. No adenopathy is evident by radiography. IMPRESSION: Stable nodular opacity in the lingula measuring 1.4 x 1.3 cm. Bilateral pleural effusions with bibasilar atelectasis. No new opacity evident compared to recent PET study. Heart upper normal in size.  Port-A-Cath tip in superior vena cava. Electronically Signed   By: Lowella Grip III M.D.   On: 03/29/2020 12:33   MR FOOT LEFT WO CONTRAST  Result Date: 03/30/2020 CLINICAL DATA:  Foot swelling, diabetes, possible osteomyelitis. Gas in the soft tissues at the base of the  small toe. EXAM: MRI OF THE LEFT FOOT WITHOUT CONTRAST TECHNIQUE: Multiplanar, multisequence MR imaging of the left forefoot was performed. No intravenous contrast was administered. COMPARISON:  Radiographs 03/29/2020 FINDINGS: Bones/Joint/Cartilage Abnormal edema, cortical destruction/demineralization, and reduced T1 signal in the distal metaphysis and head of the fifth metatarsal and in the proximal phalanx of the small toe (especially proximally) compatible with osteomyelitis. Septic joint is a distinct possibility although the lack of a substantial joint effusion may indicate that the joint is draining to the plantar skin surface. There is  synovitis in the fifth MTP joint as well as gas along the margins of the joint capsule. No other regions of osteomyelitis identified. Ligaments Lisfranc ligament intact. Muscles and Tendons Diffuse edema within and along regional musculature which could be neurogenic or due to myositis. Soft tissues Lateral and plantar to the fifth MTP joint there is abnormal gas, suspected cutaneous ulceration, and extensive inflammation. Some tiny amount of metal artifact in this vicinity possibly from bandaging, there is no visible metal foreign body on recent radiographs. Cannot exclude draining sinus tract to the skin from the fifth MTP joint. Do not see a readily drainable abscess although there is extensive subcutaneous edema tracking in the foot, especially dorsally, and into the toes. Cellulitis is a distinct possibility. IMPRESSION: 1. Osteomyelitis of the distal metaphysis and head of the fifth metatarsal and in the proximal phalanx of the small toe. 2. Fifth MTP septic joint is a distinct possibility although the lack of a substantial joint effusion may indicate that the joint is draining to the plantar or lateral surfaces. Loculations of gas in the soft tissues along the margins of the fifth MTP joint. 3. Extensive subcutaneous edema tracking in the foot, especially dorsally, and into the toes. Cellulitis is a distinct possibility. 4. Diffuse edema within and along regional musculature which could be neurogenic or due to myositis. Electronically Signed   By: Van Clines M.D.   On: 03/30/2020 08:50   NM PET Image Restag (PS) Skull Base To Thigh  Result Date: 03/28/2020 CLINICAL DATA:  Subsequent treatment strategy for breast cancer. EXAM: NUCLEAR MEDICINE PET SKULL BASE TO THIGH TECHNIQUE: 10.2 mCi F-18 FDG was injected intravenously. Full-ring PET imaging was performed from the skull base to thigh after the radiotracer. CT data was obtained and used for attenuation correction and anatomic localization.  Fasting blood glucose: 47 mg/dl COMPARISON:  12/12/2019. FINDINGS: Mediastinal blood pool activity: SUV max 1.9 Liver activity: SUV max NA NECK: No abnormal hypermetabolism. Incidental CT findings: None. CHEST: New and pre-existing hypermetabolic pleural/subpleural nodules are seen bilaterally. Index 1.4 x 1.8 cm juxta mediastinal subpleural right upper lobe nodule has an SUV max of 7.1, compared to 3.2 previously. 10 mm subcarinal lymph node has an SUV max of 3.8. No hypermetabolic axillary adenopathy. Lateral left breast mass measures similar, 1.8 x 3.3 cm with an SUV max 4.4, compared to 3.4 previously. Incidental CT findings: Right IJ Port-A-Cath terminates in the SVC. Atherosclerotic calcification of the aorta. Heart is enlarged. No pericardial effusion. Moderate right pleural effusion and small left pleural effusion, increased, with associated pleural thickening. ABDOMEN/PELVIS: New hypermetabolism in both adrenal glands, with an index SUV mass on the right of 4.4. The CT appearance of the adrenal glands is unchanged from 12/12/2019, however. No abnormal hypermetabolism in the liver, spleen or pancreas. Left external iliac lymph nodes are mildly hypermetabolic. Index 7 mm left external iliac lymph node (3/217) has increased slightly in size from 4 mm on 12/12/2019 and  has an SUV max of 2.8. Uptake associated with soft tissue thickening along the right paramidline ventral abdominal wall may be postoperative in etiology. Incidental CT findings: Liver is unremarkable. Cholecystectomy. Adrenal glands are otherwise unremarkable. 1.4 cm low-attenuation lesion off the right kidney is likely a cyst although definitive characterization is limited postcontrast imaging. Kidneys, spleen, pancreas, stomach and bowel are otherwise grossly unremarkable. SKELETON: Treated sclerotic lesions are seen. For example, an index lesion in the right acetabulum does not show abnormal hypermetabolism. There are new foci of  hypermetabolism within the spine. Index 5 mm lytic lesion in the T10 vertebral body (3/117) has an SUV max of 4.8. Incidental CT findings: Degenerative changes in the spine. IMPRESSION: 1. Progressive pleuroparenchymal and osseous metastatic disease. 2. New hypermetabolic lymph nodes in the subcarinal station and left external iliac chain. 3. Similar hypermetabolic primary left breast mass. 4. New hypermetabolism within the adrenal glands, without definite CT correlate. Continued attention on follow-up exams is warranted. 5. Moderate right and small left pleural effusions, increased, with associated pleural thickening. 6.  Aortic atherosclerosis (ICD10-I70.0). Electronically Signed   By: Lorin Picket M.D.   On: 03/28/2020 10:51   DG Foot Complete Left  Result Date: 03/29/2020 CLINICAL DATA:  Left foot wound, initial encounter EXAM: LEFT FOOT - COMPLETE 3+ VIEW COMPARISON:  None. FINDINGS: Considerable soft tissue swelling is noted in the distal aspect of the foot. Some subcutaneous air is noted about the fifth MTP joint. These changes correspond to the given clinical history of localized infection. No definitive fracture or bony erosive changes are noted. No other focal abnormality is seen. IMPRESSION: Soft tissue swelling and subcutaneous air consistent with localized infection predominately about the fifth MTP joint. No definitive erosive changes are noted. If clinically indicated MRI may be helpful for further evaluation. Electronically Signed   By: Inez Catalina M.D.   On: 03/29/2020 15:56   DG MINI C-ARM IMAGE ONLY  Result Date: 03/30/2020 There is no interpretation for this exam.  This order is for images obtained during a surgical procedure.  Please See "Surgeries" Tab for more information regarding the procedure.   ECHOCARDIOGRAM COMPLETE  Result Date: 03/30/2020    ECHOCARDIOGRAM REPORT   Patient Name:   [name] Date of Exam: 03/30/2020 Medical Rec #:  [id number]      Height:       62.0  in Accession #:    [id number]     Weight:       194.0 lb Date of Birth:  1956-03-21       BSA:          1.887 m Patient Age:    73 years       BP:           129/76 mmHg Patient Gender: F              HR:           88 bpm. Exam Location:  [facility] Procedure: 2D Echo, Cardiac Doppler and Color Doppler Indications:     Fever 780.6  History:         Patient has no prior history of Echocardiogram examinations.                  CHF, Stroke, Arrythmias:PAC; Risk Factors:Hypertension.  Sonographer:     Sherrie Sport RDCS (AE) Referring Phys:  [place] Diagnosing Phys: Serafina Royals MD IMPRESSIONS  1. Left ventricular ejection fraction, by estimation, is 55 to 60%.  The left ventricle has normal function. The left ventricle has no regional wall motion abnormalities. Left ventricular diastolic parameters were normal.  2. Right ventricular systolic function is normal. The right ventricular size is normal. There is normal pulmonary artery systolic pressure.  3. Left atrial size was mildly dilated.  4. The mitral valve is normal in structure. Mild to moderate mitral valve regurgitation.  5. The aortic valve is normal in structure. Aortic valve regurgitation is not visualized. FINDINGS  Left Ventricle: Left ventricular ejection fraction, by estimation, is 55 to 60%. The left ventricle has normal function. The left ventricle has no regional wall motion abnormalities. The left ventricular internal cavity size was normal in size. There is  no left ventricular hypertrophy. Left ventricular diastolic parameters were normal. Right Ventricle: The right ventricular size is normal. No increase in right ventricular wall thickness. Right ventricular systolic function is normal. There is normal pulmonary artery systolic pressure. The tricuspid regurgitant velocity is 2.42 m/s, and  with an assumed right atrial pressure of 10 mmHg, the estimated right ventricular systolic pressure is 20.9 mmHg. Left Atrium: Left atrial size was mildly  dilated. Right Atrium: Right atrial size was normal in size. Pericardium: There is no evidence of pericardial effusion. Mitral Valve: The mitral valve is normal in structure. Mild to moderate mitral valve regurgitation. Tricuspid Valve: The tricuspid valve is normal in structure. Tricuspid valve regurgitation is trivial. Aortic Valve: The aortic valve is normal in structure. Aortic valve regurgitation is not visualized. Aortic valve mean gradient measures 6.0 mmHg. Aortic valve peak gradient measures 10.4 mmHg. Aortic valve area, by VTI measures 1.74 cm. Pulmonic Valve: The pulmonic valve was normal in structure. Pulmonic valve regurgitation is not visualized. Aorta: The aortic root and ascending aorta are structurally normal, with no evidence of dilitation. IAS/Shunts: No atrial level shunt detected by color flow Doppler.  LEFT VENTRICLE PLAX 2D LVIDd:         4.30 cm  Diastology LVIDs:         2.79 cm  LV e' lateral:   7.51 cm/s LV PW:         1.43 cm  LV E/e' lateral: 14.1 LV IVS:        1.17 cm  LV e' medial:    6.31 cm/s LVOT diam:     2.00 cm  LV E/e' medial:  16.8 LV SV:         58 LV SV Index:   31 LVOT Area:     3.14 cm  RIGHT VENTRICLE RV Basal diam:  2.73 cm RV S prime:     15.20 cm/s TAPSE (M-mode): 3.9 cm LEFT ATRIUM             Index       RIGHT ATRIUM           Index LA diam:        4.30 cm 2.28 cm/m  RA Area:     16.00 cm LA Vol (A2C):   65.2 ml 34.55 ml/m RA Volume:   41.00 ml  21.73 ml/m LA Vol (A4C):   42.3 ml 22.42 ml/m LA Biplane Vol: 55.4 ml 29.36 ml/m  AORTIC VALVE                    PULMONIC VALVE AV Area (Vmax):    1.67 cm     PV Vmax:        0.71 m/s AV Area (Vmean):   1.59 cm  PV Peak grad:   2.0 mmHg AV Area (VTI):     1.74 cm     RVOT Peak grad: 3 mmHg AV Vmax:           161.00 cm/s AV Vmean:          114.333 cm/s AV VTI:            0.337 m AV Peak Grad:      10.4 mmHg AV Mean Grad:      6.0 mmHg LVOT Vmax:         85.70 cm/s LVOT Vmean:        57.800 cm/s LVOT VTI:           0.186 m LVOT/AV VTI ratio: 0.55  AORTA Ao Root diam: 2.70 cm MITRAL VALVE                TRICUSPID VALVE MV Area (PHT): 5.66 cm     TR Peak grad:   23.4 mmHg MV Decel Time: 134 msec     TR Vmax:        242.00 cm/s MV E velocity: 106.00 cm/s MV A velocity: 150.00 cm/s  SHUNTS MV E/A ratio:  0.71         Systemic VTI:  0.19 m                             Systemic Diam: 2.00 cm Serafina Royals MD Electronically signed by Serafina Royals MD Signature Date/Time: 03/30/2020/1:20:11 PM    Final     Carcinoma of upper-inner quadrant of left breast in female, estrogen receptor positive ([place]) #64 year old female patient with a history of metastatic breast cancer CKD stage IV; poorly controlled diabetes is currently admitted to hospital for left foot cellulitis/osteomyelitis  #Left breast cancer stage IV-ER/PR positive HER-2 negative-currently on eribulin-PET scan March 25, 2020-shows progressive disease adrenals/bone/pleural parenchymal-consistent with recently rising tumor marker.  #Left lower extremity infection-cellulitis/osteomyelitis status post amputation-on broad-spectrum antibiotics.  Reviewed/and appreciate the recommendations.  # Chronic kidney disease - stage IV-GFR 19; STABLE: [[name]  # Anemia- hemoglobin today-8-9/CKD-STABLE; On PO iron.  #Recommendations:  #I reviewed recent progression of disease noted in the PET scan and reviewed with the patient implications of progressive disease.  Patient understands that her current therapy is not working; she would need to be started on subsequent line of therapy.  However she also understands that she cannot be started on new chemotherapy-unless her infection issues are resolved/stable.  Is obviously is making the patient quite nervous; which is quite understandable.  I reassured the patient that I would reach out to her other treating doctors; and will try to get her back on chemotherapy when safe from infection standpoint.   Thank you Dr.Kumar  for allowing me to participate in the care of your pleasant patient. Please do not hesitate to contact me with questions or concerns in the interim.  Discussed Dr. Dwyane Dee.  All questions were answered. The patient knows to call the clinic with any problems, questions or concerns.    Cammie Sickle, MD 04/02/2020 9:36 PM

## 2020-04-02 NOTE — Care Management Important Message (Signed)
Important Message  Patient Details  Name: [name] MRN: [id number] Date of Birth: Sep 18, 1955   Medicare Important Message Given:  Yes  Reviewed with patient via room phone.     Dannette Barbara 04/02/2020, 2:02 PM

## 2020-04-02 NOTE — Progress Notes (Signed)
[facility] INFECTIOUS DISEASE PROGRESS NOTE Date of Admission:  03/29/2020     ID: [name] is a 64 y.o. 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 Cx with E coli. Pending   ROS  Eleven systems are reviewed and negative except per hpi  Medications:  Antibiotics Given (last 72 hours)    Date/Time Action Medication Dose Rate   03/30/20 1949 New Bag/Given   ceFEPIme (MAXIPIME) 2 g in sodium chloride 0.9 % 100 mL IVPB 2 g 200 mL/hr   03/30/20 2110 New Bag/Given   metroNIDAZOLE (FLAGYL) IVPB 500 mg 500 mg 100 mL/hr   03/31/20 0500 New Bag/Given   metroNIDAZOLE (FLAGYL) IVPB 500 mg 500 mg 100 mL/hr   03/31/20 1306 New Bag/Given   metroNIDAZOLE (FLAGYL) IVPB 500 mg 500 mg 100 mL/hr   03/31/20 1755 New Bag/Given   vancomycin (VANCOCIN) IVPB 1000 mg/200 mL premix 1,000 mg 200 mL/hr   03/31/20 2113 New Bag/Given   ceFEPIme (MAXIPIME) 2 g in sodium chloride 0.9 % 100 mL IVPB 2 g 200 mL/hr   03/31/20 2228 New Bag/Given   metroNIDAZOLE (FLAGYL) IVPB 500 mg 500 mg 100 mL/hr   04/01/20 0503 New Bag/Given   metroNIDAZOLE (FLAGYL) IVPB 500 mg 500 mg 100 mL/hr   04/01/20 1445 New Bag/Given   metroNIDAZOLE (FLAGYL) IVPB 500 mg 500 mg 100 mL/hr   04/01/20 2102 New Bag/Given   ceFEPIme (MAXIPIME) 2 g in sodium chloride 0.9 % 100 mL IVPB 2 g 200 mL/hr   04/01/20 2229 New Bag/Given   metroNIDAZOLE (FLAGYL) IVPB 500 mg 500 mg 100 mL/hr   04/02/20 0519 New Bag/Given    metroNIDAZOLE (FLAGYL) IVPB 500 mg 500 mg 100 mL/hr   04/02/20 1330 New Bag/Given   metroNIDAZOLE (FLAGYL) IVPB 500 mg 500 mg 100 mL/hr     . amLODipine  10 mg Oral Daily  . aspirin EC  81 mg Oral Daily  . atenolol  100 mg Oral BID  . calcitRIOL  0.25 mcg Oral Daily  . cloNIDine  0.2 mg Oral BID  . docusate sodium  100 mg Oral BID  . enoxaparin (LOVENOX) injection  30 mg Subcutaneous Q24H  . famotidine  20 mg Oral Daily  . ferrous sulfate  325 mg Oral BID WC  . FLUoxetine  20 mg Oral BID  . insulin aspart  0-5 Units Subcutaneous QHS  . insulin aspart  0-9 Units Subcutaneous TID WC  . insulin detemir  55 Units Subcutaneous Daily  . senna  1 tablet Oral BID  . simvastatin  20 mg Oral QPM    Objective: Vital signs in last 24 hours: Temp:  [97.6 F (36.4 C)-98 F (36.7 C)] 98 F (36.7 C) (08/30 1148) Pulse Rate:  [74-77] 76 (08/30 1148) Resp:  [16-20] 16 (08/30 1148) BP: (148-159)/(80-95) 158/95 (08/30 1148) SpO2:  [95 %-98 %] 98 % (08/30 1148) Constitutional: He is oriented to person, place, and time.  He appears well-developed and well-nourished. No distress.  Mouth/Throat: Oropharynx is clear and moist. .  Cardiovascular: Normal rate, regular rhythm and normal heart sounds. Pulmonary/Chest: Effort normal and breath sounds normal. No respiratory distress. He has no wheezes.  Abdominal: Soft. Bowel sounds are normal. He exhibits no distension. There is no tenderness.  Lymphadenopathy: He has no cervical adenopathy.  Neurological: He is alert and oriented to person, place, and time.  Skin: L foot wrapped R leg with edema, wrapped post op Lab Results Recent Labs    04/01/20 0705 04/02/20 0710  WBC 16.4* 11.5*  HGB 7.4* 8.1*  HCT 23.0* 24.5*  NA 137 140  K 4.4 4.6  CL 109 112*  CO2 21* 21*  BUN 38* 33*  CREATININE 2.39* 2.10*    Microbiology: Results for orders placed or performed during the hospital encounter of 03/29/20  Blood culture (routine x 2)     Status:  None (Preliminary result)   Collection Time: 03/29/20  3:51 PM   Specimen: BLOOD  Result Value Ref Range Status   Specimen Description BLOOD BLOOD RIGHT HAND  Final   Special Requests   Final    BOTTLES DRAWN AEROBIC AND ANAEROBIC Blood Culture adequate volume   Culture   Final    NO GROWTH 4 DAYS Performed at [facility], [address], [place], [place] [address]    Report Status PENDING  Incomplete  Blood culture (routine x 2)     Status: None (Preliminary result)   Collection Time: 03/29/20  3:51 PM   Specimen: BLOOD  Result Value Ref Range Status   Specimen Description BLOOD LEFT ANTECUBITAL  Final   Special Requests   Final    BOTTLES DRAWN AEROBIC AND ANAEROBIC Blood Culture adequate volume   Culture   Final    NO GROWTH 4 DAYS Performed at [facility], [address], [place], [place] [address]    Report Status PENDING  Incomplete  SARS Coronavirus 2 by RT PCR (hospital order, performed in [place] hospital lab) Nasopharyngeal Nasopharyngeal Swab     Status: None   Collection Time: 03/29/20  5:15 PM   Specimen: Nasopharyngeal Swab  Result Value Ref Range Status   SARS Coronavirus 2 NEGATIVE NEGATIVE Final    Comment: (NOTE) SARS-CoV-2 target nucleic acids are NOT DETECTED.  The SARS-CoV-2 RNA is generally detectable in upper and lower respiratory specimens during the acute phase of infection. The lowest concentration of SARS-CoV-2 viral copies this assay can detect is 250 copies / mL. A negative result does not preclude SARS-CoV-2 infection and should not be used as the sole basis for treatment or other patient management decisions.  A negative result may occur with improper specimen collection / handling, submission of specimen other than nasopharyngeal swab, presence of viral mutation(s) within the areas targeted by this assay, and inadequate number of viral copies (<250 copies / mL). A negative result must be combined with  clinical observations, patient history, and epidemiological information.  Fact Sheet for Patients:   StrictlyIdeas.no  Fact Sheet for Healthcare Providers: BankingDealers.co.za  This test is not yet approved or  cleared by the [place] FDA and has been authorized for detection and/or diagnosis of SARS-CoV-2 by FDA under an Emergency Use Authorization (EUA).  This EUA will remain in effect (meaning this test can be used) for the duration of the COVID-19 declaration under Section 564(b)(1) of the Act, 21 U.S.C. section 360bbb-3(b)(1), unless the authorization is terminated or revoked sooner.  Performed at [facility], [address] [place], [place] [address]   Aerobic/Anaerobic Culture (surgical/deep wound)     Status: None (Preliminary result)   Collection Time: 03/30/20  1:19 PM   Specimen: PATH Other; Tissue  Result Value Ref Range Status   Specimen Description   Final    WOUND Performed at [facility], [address] [place], [place] [address]    Special Requests   Final    LEFT 5TH TOE JOINT INFECTION Performed at [facility], [address] [place], [place] [address]    Gram Stain   Final    NO WBC SEEN RARE GRAM POSITIVE COCCI IN PAIRS Performed at [facility], [place] [address] [place], [place] [address]    Culture   Final    FEW ESCHERICHIA COLI NO ANAEROBES ISOLATED; CULTURE IN PROGRESS FOR 5 DAYS    Report Status PENDING  Incomplete   Organism ID, Bacteria ESCHERICHIA COLI  Final      Susceptibility   Escherichia coli - MIC*    AMPICILLIN >=32 RESISTANT Resistant     CEFAZOLIN <=4 SENSITIVE Sensitive     CEFEPIME <=0.12 SENSITIVE Sensitive     CEFTAZIDIME <=1 SENSITIVE Sensitive     CEFTRIAXONE <=0.25 SENSITIVE Sensitive     CIPROFLOXACIN <=0.25 SENSITIVE Sensitive     GENTAMICIN >=16 RESISTANT Resistant     IMIPENEM <=0.25 SENSITIVE Sensitive     TRIMETH/SULFA  >=320 RESISTANT Resistant     AMPICILLIN/SULBACTAM >=32 RESISTANT Resistant     PIP/TAZO <=4 SENSITIVE Sensitive     * FEW ESCHERICHIA COLI  MRSA PCR Screening     Status: None   Collection Time: 04/02/20  8:11 AM   Specimen: Nasopharyngeal  Result Value Ref Range Status   MRSA by PCR NEGATIVE NEGATIVE Final    Comment:        The GeneXpert MRSA Assay (FDA approved for NASAL specimens only), is one component of a comprehensive MRSA colonization surveillance program. It is not intended to diagnose MRSA infection nor to guide or monitor treatment for MRSA infections. Performed at [facility], [address], [place], [place] [address]     Studies/Results: No results found.  Assessment/Plan: JOZLYNN PLAIA is a 64 y.o. 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 Recent otpt cx MRSA. Current cx E coli but still pending. S/p 5th MT ray amp 8/27.  8/30 - wbc down - path pending Recommendations Vanco for known MRSA - monitor cr and if worsens change to daptomycin Change cefepime to cefazolin and cont flagyl pending final cultures and bone path. Place Hickman line for IV abx - even if margins negative given amount of infection and inflammation will benefit. CKD - follows with Dr Abigail Butts.   Thank you very much for the consult. Will follow with you.  Leonel Ramsay   04/02/2020, 2:29 PM

## 2020-04-02 NOTE — Progress Notes (Addendum)
Pharmacy Antibiotic Note  [name] is a 64 y.o. female admitted on 03/29/2020 with MRSA cellulitis pending I&D.  Pharmacy has been consulted for  vancomycin and cefepime dosing. Patient has PMH of CKD stage V, DM,  CHF, and metastatic breast cancer. Since admission her renal function has improved and has reached her baseline SCr of approximately 2.1 mg/dL, WBC down slightly but still febrile overnight.  Wound culture has rare GPC and E coli - resistant to ampicillin, gentamicin, Bactrim, and Unasyn/ Cefepime 2g IV q24h de-escalated to cefazolin 1g IV q12h based on sensitivities.   Plan:    Continue vancomycin 1000mg  IV every 48 hours   Daily SCr while on vancomycin to assess renal function  Will order vancomycin levels for 8/30 @1730    Height: 5\' 2"  (157.5 cm) Weight: 88 kg (194 lb) IBW/kg (Calculated) : 50.1  Temp (24hrs), Avg:97.8 F (36.6 C), Min:97.6 F (36.4 C), Max:98 F (36.7 C)  Recent Labs  Lab 03/29/20 1127 03/29/20 1551 03/30/20 0453 03/31/20 0530 04/01/20 0705 04/02/20 0710  WBC 25.2*  --  21.2* 19.6* 16.4* 11.5*  CREATININE 2.74*  --  2.53* 2.43* 2.39* 2.10*  LATICACIDVEN 2.1* 1.4  --   --   --   --     Estimated Creatinine Clearance: 28.3 mL/min (A) (by C-G formula based on SCr of 2.1 mg/dL (H)).    Allergies  Allergen Reactions  . Chlorhexidine   . Fish-Derived Products   . Sulfamethoxazole-Trimethoprim Other (See Comments)    Antimicrobials this admission: 8/26 Cefepime >> 8/30 8/26 Vancomycin  >>  8/24 Metronidazole >> 8/30 Cefazolin>>    Microbiology results: 8/23 WCx: MRSA 8/27 WCx: E coli 8/26 BCx: NGTD 8/26 SARS CoV-2: negative 8/30 MRSA PCR: negative  Thank you for allowing pharmacy to be a part of this patient's care.  Sherilyn Banker, PharmD Pharmacy Resident  04/02/2020 12:56 PM

## 2020-04-02 NOTE — Treatment Plan (Signed)
Spoke to [place] in pharmacy to notify vancomycin time has been moved since trough is not back at this time. He states it is OK awaiting for results from the main hospital.

## 2020-04-02 NOTE — Treatment Plan (Signed)
MD in room per patient and rewrapped her foot and took her off wound vac at this time.

## 2020-04-02 NOTE — Progress Notes (Signed)
Daily Progress Note   Subjective  - 3 Day Post-Op  F/u left foot 5th ray amp  Patient doing well today with minimal to no complaints of pain.  She also has had the wound VAC in place since last Friday.  She notes that no one has changed wound VAC today.  Patient has tried to stay off the left foot is much as possible.  Objective Vitals:   04/01/20 2105 04/02/20 0500 04/02/20 0739 04/02/20 1148  BP: (!) 154/81 (!) 148/80 (!) 159/88 (!) 158/95  Pulse: 76 74 77 76  Resp: 20 20 20 16   Temp: 97.6 F (36.4 C) 97.9 F (36.6 C)  98 F (36.7 C)  TempSrc: Oral Oral    SpO2: 95% 96% 97% 98%  Weight:      Height:        Physical Exam: Left foot wound to fifth ray amputation site appears stable with maceration present.  Dressing changed.  No purulence.  Erythema improved.     Laboratory CBC    Component Value Date/Time   WBC 11.5 (H) 04/02/2020 0710   HGB 8.1 (L) 04/02/2020 0710   HGB 12.5 06/06/2014 1102   HCT 24.5 (L) 04/02/2020 0710   HCT 37.6 06/06/2014 1102   PLT 450 (H) 04/02/2020 0710   PLT 396 06/06/2014 1102    BMET    Component Value Date/Time   NA 140 04/02/2020 0710   NA 130 (L) 06/06/2014 1102   K 4.6 04/02/2020 0710   K 3.9 06/06/2014 1102   CL 112 (H) 04/02/2020 0710   CL 95 (L) 06/06/2014 1102   CO2 21 (L) 04/02/2020 0710   CO2 28 06/06/2014 1102   GLUCOSE 140 (H) 04/02/2020 0710   GLUCOSE 349 (H) 06/06/2014 1102   BUN 33 (H) 04/02/2020 0710   BUN 17 06/06/2014 1102   CREATININE 2.10 (H) 04/02/2020 0710   CREATININE 1.63 (H) 06/06/2014 1102   CALCIUM 8.3 (L) 04/02/2020 0710   CALCIUM 9.2 06/06/2014 1102   GFRNONAA 24 (L) 04/02/2020 0710   GFRNONAA 35 (L) 06/06/2014 1102   GFRAA 28 (L) 04/02/2020 0710   GFRAA 42 (L) 06/06/2014 1102    Assessment/Planning: S/P amputation 5th ray for osteo/abscess   Dressing changed today - betadine wet to dry due to maceration present  Likely to continue wound vac on Wednesday if maceration is  improved.  C/W M/W/F vac dressings when resumed  Awaiting cultures.  GPC's currently.  Appreciate ID recs.  Will re-eval tomorrow  NWB left foot.  Caroline More, DPM  04/02/2020, 1:57 PM

## 2020-04-03 ENCOUNTER — Inpatient Hospital Stay: Payer: Medicare HMO

## 2020-04-03 LAB — GLUCOSE, CAPILLARY
Glucose-Capillary: 201 mg/dL — ABNORMAL HIGH (ref 70–99)
Glucose-Capillary: 267 mg/dL — ABNORMAL HIGH (ref 70–99)
Glucose-Capillary: 181 mg/dL — ABNORMAL HIGH (ref 70–99)

## 2020-04-03 LAB — CULTURE, BLOOD (ROUTINE X 2)
Culture: NO GROWTH
Culture: NO GROWTH
Special Requests: ADEQUATE
Special Requests: ADEQUATE

## 2020-04-03 LAB — PHOSPHORUS: Phosphorus: 2.3 mg/dL — ABNORMAL LOW (ref 2.5–4.6)

## 2020-04-03 LAB — CBC
HCT: 26.9 % — ABNORMAL LOW (ref 36.0–46.0)
Hemoglobin: 8.8 g/dL — ABNORMAL LOW (ref 12.0–15.0)
MCH: 29.9 pg (ref 26.0–34.0)
MCHC: 32.7 g/dL (ref 30.0–36.0)
MCV: 91.5 fL (ref 80.0–100.0)
Platelets: 504 10*3/uL — ABNORMAL HIGH (ref 150–400)
RBC: 2.94 MIL/uL — ABNORMAL LOW (ref 3.87–5.11)
RDW: 15.6 % — ABNORMAL HIGH (ref 11.5–15.5)
WBC: 12.4 10*3/uL — ABNORMAL HIGH (ref 4.0–10.5)
nRBC: 0.4 % — ABNORMAL HIGH (ref 0.0–0.2)

## 2020-04-03 LAB — BASIC METABOLIC PANEL
Anion gap: 7 (ref 5–15)
BUN: 30 mg/dL — ABNORMAL HIGH (ref 8–23)
CO2: 21 mmol/L — ABNORMAL LOW (ref 22–32)
Calcium: 8.6 mg/dL — ABNORMAL LOW (ref 8.9–10.3)
Chloride: 109 mmol/L (ref 98–111)
Creatinine, Ser: 1.92 mg/dL — ABNORMAL HIGH (ref 0.44–1.00)
GFR calc Af Amer: 32 mL/min — ABNORMAL LOW (ref >60)
GFR calc non Af Amer: 27 mL/min — ABNORMAL LOW (ref >60)
Glucose, Bld: 217 mg/dL — ABNORMAL HIGH (ref 70–99)
Potassium: 4.7 mmol/L (ref 3.5–5.1)
Sodium: 137 mmol/L (ref 135–145)

## 2020-04-03 LAB — SURGICAL PATHOLOGY

## 2020-04-03 LAB — MAGNESIUM: Magnesium: 2.1 mg/dL (ref 1.7–2.4)

## 2020-04-03 MED ORDER — VANCOMYCIN HCL 1500 MG/300ML IV SOLN
1500.0000 mg | INTRAVENOUS | Status: DC
  Administered 2020-04-04 – 2020-04-08 (×3): 1500 mg via INTRAVENOUS
  Filled 2020-04-03 (×3): qty 300

## 2020-04-03 MED ORDER — ENOXAPARIN SODIUM 40 MG/0.4ML ~~LOC~~ SOLN
40.0000 mg | SUBCUTANEOUS | Status: DC
  Administered 2020-04-03 – 2020-04-07 (×5): 40 mg via SUBCUTANEOUS
  Filled 2020-04-03 (×5): qty 0.4

## 2020-04-03 MED ORDER — K PHOS MONO-SOD PHOS DI & MONO 155-852-130 MG PO TABS
500.0000 mg | ORAL_TABLET | Freq: Once | ORAL | Status: AC
  Administered 2020-04-03: 500 mg via ORAL
  Filled 2020-04-03: qty 2

## 2020-04-03 MED ORDER — INSULIN DETEMIR 100 UNIT/ML ~~LOC~~ SOLN
25.0000 [IU] | Freq: Every day | SUBCUTANEOUS | Status: DC
  Administered 2020-04-04 – 2020-04-09 (×6): 25 [IU] via SUBCUTANEOUS
  Filled 2020-04-03 (×6): qty 0.25

## 2020-04-03 MED ORDER — CEFAZOLIN SODIUM-DEXTROSE 1-4 GM/50ML-% IV SOLN
1.0000 g | Freq: Three times a day (TID) | INTRAVENOUS | Status: DC
  Administered 2020-04-03 – 2020-04-08 (×13): 1 g via INTRAVENOUS
  Filled 2020-04-03 (×16): qty 50

## 2020-04-03 NOTE — Progress Notes (Signed)
Daily Progress Note   Subjective  - 4 Days Post-Op  Follow-up left fifth ray amputation.  Doing well.  Objective Vitals:   04/02/20 2037 04/03/20 0346 04/03/20 0912 04/03/20 1210  BP: (!) 154/90 (!) 166/94 (!) 172/91 (!) 171/90  Pulse: 76 80  76  Resp: 20 20    Temp: 98.5 F (36.9 C) 97.9 F (36.6 C)  97.9 F (36.6 C)  TempSrc: Oral Oral    SpO2: 97% 95%  98%  Weight:      Height:        Physical Exam: The proximal and distal margins are stable at this time.  Not completely coapted but not dehisced at all.  Laboratory CBC    Component Value Date/Time   WBC 12.4 (H) 04/03/2020 0821   HGB 8.8 (L) 04/03/2020 0821   HGB 12.5 06/06/2014 1102   HCT 26.9 (L) 04/03/2020 0821   HCT 37.6 06/06/2014 1102   PLT 504 (H) 04/03/2020 0821   PLT 396 06/06/2014 1102    BMET    Component Value Date/Time   NA 137 04/03/2020 0821   NA 130 (L) 06/06/2014 1102   K 4.7 04/03/2020 0821   K 3.9 06/06/2014 1102   CL 109 04/03/2020 0821   CL 95 (L) 06/06/2014 1102   CO2 21 (L) 04/03/2020 0821   CO2 28 06/06/2014 1102   GLUCOSE 217 (H) 04/03/2020 0821   GLUCOSE 349 (H) 06/06/2014 1102   BUN 30 (H) 04/03/2020 0821   BUN 17 06/06/2014 1102   CREATININE 1.92 (H) 04/03/2020 0821   CREATININE 1.63 (H) 06/06/2014 1102   CALCIUM 8.6 (L) 04/03/2020 0821   CALCIUM 9.2 06/06/2014 1102   GFRNONAA 27 (L) 04/03/2020 0821   GFRNONAA 35 (L) 06/06/2014 1102   GFRAA 32 (L) 04/03/2020 0821   GFRAA 42 (L) 06/06/2014 1102    Assessment/Planning: Status post fifth ray amputation for osteomyelitis   At this point continue with daily dressing changes with Betadine.  Upon discharge can perform every other day dressing changes or 3 times a week dressing changes with home health skilled nursing.  This can be a Betadine dressing change at that time.  Try to remain minimally weightbearing to this left foot.  Appreciate infectious disease recommendations.  Suspect patient will need long-term IV  antibiotics due to severity of infection.  Will likely need to follow-up with serial x-rays to evaluate the remaining forefoot down the road.  We will order x-rays today for baseline.  Podiatry to follow loosely while in hospital at this point.  Once discharged to follow-up in the outpatient clinic with me in approximately 2 weeks.  Samara Deist A  04/03/2020, 1:17 PM

## 2020-04-03 NOTE — Progress Notes (Signed)
Inpatient Diabetes Program Recommendations  AACE/ADA: New Consensus Statement on Inpatient Glycemic Control (2015)  Target Ranges:  Prepandial:   less than 140 mg/dL      Peak postprandial:   less than 180 mg/dL (1-2 hours)      Critically ill patients:  140 - 180 mg/dL   Lab Results  Component Value Date   GLUCAP 201 (H) 04/03/2020   HGBA1C 9.0 (H) 09/17/2018    Review of Glycemic Control Results for [name], [name] (MRN [id number]) as of 04/03/2020 11:09  Ref. Range 04/01/2020 05:00 04/01/2020 08:56 04/01/2020 09:25 04/01/2020 09:27 04/01/2020 10:08 04/01/2020 12:35 04/01/2020 16:47 04/01/2020 20:53 04/02/2020 07:47 04/02/2020 11:48 04/02/2020 16:22 04/02/2020 21:23 04/03/2020 07:49  Glucose-Capillary Latest Ref Range: 70 - 99 mg/dL 88 69 (L) 63 (L) 68 (L) 108 (H) 135 (H) 146 (H) 140 (H) 131 (H) 206 (H) 199 (H) 196 (H) 201 (H)   Diabetes history: DM2 Outpatient Diabetes medications: Levemir 55 units qd + Novolog 7 units bid Current orders for Inpatient glycemic control: Levemir 55 units qd + Novolog sensitive correction tid + hs 0-5 units  Inpatient Diabetes Program Recommendations:   Noted patient did not receive Levemir yesterday and fasting CBG 44 on 04/01/20 with Levemir held. Received Levemir 55 units this am as prescribed. -Decrease Levemir to 25 units (50% home insulin dose) -Add Novolog meal coverage as needed Secure chat to [place].  Thank you, Nani Gasser. Cheyanne Lamison, RN, MSN, CDE  Diabetes Coordinator Inpatient Glycemic Control Team Team Pager [phone] (8am-5pm) 04/03/2020 11:13 AM

## 2020-04-03 NOTE — Progress Notes (Signed)
[facility]  ROUNDING NOTE   Subjective:   [name] was admitted to [facility] on 03/29/2020 for Sepsis [facility]) [A41.9] Sepsis due to cellulitis ([place]) [L03.90, A41.9]  Patient was found to have osteomyelitis. Patient underwent left 5th toe amputation by Dr. Vickki Muff on 8/27.   Nephrology consulted  Objective:  Vital signs in last 24 hours:  Temp:  [97.9 F (36.6 C)-98.5 F (36.9 C)] 97.9 F (36.6 C) (08/31 1210) Pulse Rate:  [76-80] 76 (08/31 1210) Resp:  [20] 20 (08/31 0346) BP: (154-172)/(90-94) 171/90 (08/31 1210) SpO2:  [95 %-98 %] 98 % (08/31 1210)  Weight change:  Filed Weights   03/29/20 1119  Weight: 88 kg    Intake/Output: I/O last 3 completed shifts: In: 1459.3 [P.O.:420; I.V.:308.5; IV Piggyback:730.8] Out: 0    Intake/Output this shift:  Total I/O In: 750 [P.O.:600; IV Piggyback:150] Out: -   Physical Exam: General: NAD,   Head: Normocephalic, atraumatic. Moist oral mucosal membranes  Eyes: Anicteric, PERRL  Neck: Supple, trachea midline  Lungs:  Clear to auscultation  Heart: Regular rate and rhythm  Abdomen:  Soft, nontender,   Extremities:  left foot in clean and dry dressings  Neurologic: Nonfocal, moving all four extremities  Skin: No lesions       Basic Metabolic Panel: Recent Labs  Lab 03/30/20 0453 03/30/20 0453 03/31/20 0530 03/31/20 0530 04/01/20 0705 04/02/20 0710 04/03/20 0821  NA 137  --  135  --  137 140 137  K 4.5  --  5.1  --  4.4 4.6 4.7  CL 105  --  106  --  109 112* 109  CO2 23  --  24  --  21* 21* 21*  GLUCOSE 160*  --  347*  --  96 140* 217*  BUN 36*  --  39*  --  38* 33* 30*  CREATININE 2.53*  --  2.43*  --  2.39* 2.10* 1.92*  CALCIUM 8.5*   < > 8.0*   < > 8.4* 8.3* 8.6*  MG  --   --  2.1  --   --  2.2 2.1  PHOS  --   --  3.9  --   --  2.6 2.3*   < > = values in this interval not displayed.    Liver Function Tests: Recent Labs  Lab 03/29/20 1127 03/30/20 0453  AST 78* 30  ALT 55* 38   ALKPHOS 94 77  BILITOT 0.8 1.0  PROT 7.7 6.9  ALBUMIN 3.0* 2.6*   No results for input(s): LIPASE, AMYLASE in the last 168 hours. No results for input(s): AMMONIA in the last 168 hours.  CBC: Recent Labs  Lab 03/29/20 1127 03/29/20 1127 03/30/20 0453 03/30/20 0453 03/30/20 1658 03/31/20 0530 04/01/20 0705 04/02/20 0710 04/03/20 0821  WBC 25.2*   < > 21.2*  --   --  19.6* 16.4* 11.5* 12.4*  NEUTROABS 23.2*  --   --   --   --   --   --   --   --   HGB 8.6*   < > 7.3*   < > 7.4* 7.5* 7.4* 8.1* 8.8*  HCT 26.1*   < > 22.0*   < > 22.7* 21.9* 23.0* 24.5* 26.9*  MCV 91.3   < > 90.9  --   --  89.0 92.4 91.1 91.5  PLT 359   < > 346  --   --  346 433* 450* 504*   < > =  values in this interval not displayed.    Cardiac Enzymes: No results for input(s): CKTOTAL, CKMB, CKMBINDEX, TROPONINI in the last 168 hours.  BNP: Invalid input(s): POCBNP  CBG: Recent Labs  Lab 04/02/20 1148 04/02/20 1622 04/02/20 2123 04/03/20 0749 04/03/20 1607  GLUCAP 206* 199* 196* 201* 181*    Microbiology: Results for orders placed or performed during the hospital encounter of 03/29/20  Blood culture (routine x 2)     Status: None   Collection Time: 03/29/20  3:51 PM   Specimen: BLOOD  Result Value Ref Range Status   Specimen Description BLOOD BLOOD RIGHT HAND  Final   Special Requests   Final    BOTTLES DRAWN AEROBIC AND ANAEROBIC Blood Culture adequate volume   Culture   Final    NO GROWTH 5 DAYS Performed at [facility], [address], [place], [place] [address]    Report Status 04/03/2020 FINAL  Final  Blood culture (routine x 2)     Status: None   Collection Time: 03/29/20  3:51 PM   Specimen: BLOOD  Result Value Ref Range Status   Specimen Description BLOOD LEFT ANTECUBITAL  Final   Special Requests   Final    BOTTLES DRAWN AEROBIC AND ANAEROBIC Blood Culture adequate volume   Culture   Final    NO GROWTH 5 DAYS Performed at [facility], [address], [place], [place] [address]    Report Status 04/03/2020 FINAL  Final  SARS Coronavirus 2 by RT PCR (hospital order, performed in [facility] hospital lab) Nasopharyngeal Nasopharyngeal Swab     Status: None   Collection Time: 03/29/20  5:15 PM   Specimen: Nasopharyngeal Swab  Result Value Ref Range Status   SARS Coronavirus 2 NEGATIVE NEGATIVE Final    Comment: (NOTE) SARS-CoV-2 target nucleic acids are NOT DETECTED.  The SARS-CoV-2 RNA is generally detectable in upper and lower respiratory specimens during the acute phase of infection. The lowest concentration of SARS-CoV-2 viral copies this assay can detect is 250 copies / mL. A negative result does not preclude SARS-CoV-2 infection and should not be used as the sole basis for treatment or other patient management decisions.  A negative result may occur with improper specimen collection / handling, submission of specimen other than nasopharyngeal swab, presence of viral mutation(s) within the areas targeted by this assay, and inadequate number of viral copies (<250 copies / mL). A negative result must be combined with clinical observations, patient history, and epidemiological information.  Fact Sheet for Patients:   StrictlyIdeas.no  Fact Sheet for Healthcare Providers: BankingDealers.co.za  This test is not yet approved or  cleared by the [place] FDA and has been authorized for detection and/or diagnosis of SARS-CoV-2 by FDA under an Emergency Use Authorization (EUA).  This EUA will remain in effect (meaning this test can be used) for the duration of the COVID-19 declaration under Section 564(b)(1) of the Act, 21 U.S.C. section 360bbb-3(b)(1), unless the authorization is terminated or revoked sooner.  Performed at [facility], [address] [place], [place] [address]   Aerobic/Anaerobic Culture (surgical/deep wound)     Status: None (Preliminary result)    Collection Time: 03/30/20  1:19 PM   Specimen: PATH Other; Tissue  Result Value Ref Range Status   Specimen Description   Final    WOUND Performed at [facility], [address], [place], [place] [address]    Special Requests   Final    LEFT 5TH  TOE JOINT INFECTION Performed at [facility], [address] [place], [place] [address]    Gram Stain   Final    NO WBC SEEN RARE GRAM POSITIVE COCCI IN PAIRS Performed at [facility], [place] [address], [place], [place] [address]    Culture   Final    FEW ESCHERICHIA COLI NO ANAEROBES ISOLATED; CULTURE IN PROGRESS FOR 5 DAYS    Report Status PENDING  Incomplete   Organism ID, Bacteria ESCHERICHIA COLI  Final      Susceptibility   Escherichia coli - MIC*    AMPICILLIN >=32 RESISTANT Resistant     CEFAZOLIN <=4 SENSITIVE Sensitive     CEFEPIME <=0.12 SENSITIVE Sensitive     CEFTAZIDIME <=1 SENSITIVE Sensitive     CEFTRIAXONE <=0.25 SENSITIVE Sensitive     CIPROFLOXACIN <=0.25 SENSITIVE Sensitive     GENTAMICIN >=16 RESISTANT Resistant     IMIPENEM <=0.25 SENSITIVE Sensitive     TRIMETH/SULFA >=320 RESISTANT Resistant     AMPICILLIN/SULBACTAM >=32 RESISTANT Resistant     PIP/TAZO <=4 SENSITIVE Sensitive     * FEW ESCHERICHIA COLI  MRSA PCR Screening     Status: None   Collection Time: 04/02/20  8:11 AM   Specimen: Nasopharyngeal  Result Value Ref Range Status   MRSA by PCR NEGATIVE NEGATIVE Final    Comment:        The GeneXpert MRSA Assay (FDA approved for NASAL specimens only), is one component of a comprehensive MRSA colonization surveillance program. It is not intended to diagnose MRSA infection nor to guide or monitor treatment for MRSA infections. Performed at [facility], [address] [place], [place] [address]     Coagulation Studies: No results for input(s): LABPROT, INR in the last 72 hours.  Urinalysis: No results for input(s): COLORURINE, LABSPEC, PHURINE, GLUCOSEU,  HGBUR, BILIRUBINUR, KETONESUR, PROTEINUR, UROBILINOGEN, NITRITE, LEUKOCYTESUR in the last 72 hours.  Invalid input(s): APPERANCEUR    Imaging: DG Foot Complete Left  Result Date: 04/03/2020 CLINICAL DATA:  Fifth ray amputation EXAM: LEFT FOOT - COMPLETE 3+ VIEW COMPARISON:  None. FINDINGS: Interval postsurgical changes from transmetatarsal amputation of the fifth ray at the level of the proximal diaphysis. Resection margin is smooth. Expected postoperative changes within the distal soft tissues at the amputation site. No acute fracture. No malalignment. No new areas of cortical irregularity. IMPRESSION: Interval postsurgical changes from transmetatarsal amputation of the fifth ray at the level of the proximal diaphysis. Electronically Signed   By: Davina Poke D.O.   On: 04/03/2020 16:23     Medications:     ceFAZolin (ANCEF) IV     metronidazole 500 mg (04/03/20 1412)   [START ON 04/04/2020] vancomycin      amLODipine  10 mg Oral Daily   aspirin EC  81 mg Oral Daily   atenolol  100 mg Oral BID   calcitRIOL  0.25 mcg Oral Daily   cloNIDine  0.2 mg Oral BID   docusate sodium  100 mg Oral BID   enoxaparin (LOVENOX) injection  40 mg Subcutaneous Q24H   famotidine  20 mg Oral Daily   ferrous sulfate  325 mg Oral BID WC   FLUoxetine  20 mg Oral BID   insulin aspart  0-5 Units Subcutaneous QHS   insulin aspart  0-9 Units Subcutaneous TID WC   [START ON 04/04/2020] insulin detemir  25 Units Subcutaneous Daily   senna  1 tablet Oral BID   simvastatin  20 mg  Oral QPM   acetaminophen **OR** acetaminophen, albuterol, ALPRAZolam, budesonide, ondansetron **OR** ondansetron (ZOFRAN) IV, oxyCODONE  Assessment/ Plan:  [name] is a 64 y.o. black female with metastatic breast cancer, diabetes mellitus type II, asthma, CVA, congestive heart failure, hypertension who is admitted to [facility] on 03/29/2020 for Sepsis ([place]) [A41.9] Sepsis due to cellulitis ([place]) [L03.90,  A41.9]  1. Chronic Kidney Disease stage IV: with proteinuria: creatinine has improved significantly and now close to her baseline of  creatinine of 2.88, GFR of 17 on 03/16/20.  Currently holding enalapril and bumetanide - Do not recommend a PICC or midline on this patient in light of her advanced renal failure.   2. Hypertension: blood pressure elevated with peripheral edema. Home regimen of enalapril, atenolol, bumetanide, clonidine, and amlodipine. Currently taking amlodipine, atenolol, clonidine.  3. Diabetes Mellitus type II with chronic kidney disease: Insulin dependent. Hemoglobin A1c of 9% on 09/17/2018 - Check hemoglobin A1c  4. Secondary Hyperparathyroidism  - Check PTH - Continue calcitriol   5. Anemia with chronic kidney disease:  hemoglobin 8.8 - Followed by [place]   LOS: [place] 8/31/20214:37 PM

## 2020-04-03 NOTE — Progress Notes (Addendum)
PROGRESS NOTE    [name]  TIW:[id number] DOB: November 04, 1955 DOA: 03/29/2020 PCP: [name], MD   Brief Narrative: [name] is a 64 y.o. female with medical history significant of metastatic breast cancer, diabetes mellitus, CKD stage III, CHF, presents in the emergency department with open draining left foot wound and associated fever.   Patient was admitted for sepsis secondary to infected left foot wound,  MRI consistent with osteomyelitis.  Podiatry consulted, Patient underwent fifth ray amputation,  tolerated well.  Patient is continued on IV antibiotics,   Podiatry is following , wound care needs to be arranged.  Infectious diseases consulted to to guide antibiotic regimen.  ID recommended continue vancomycin for known MRSA and Flagyl and cefepime.  Patient will need Hickman's catheter for long-term IV antibiotics given history of CKD stage III.  Vascular surgery consulted for Hickman catheter.  Assessment & Plan:   Principal Problem:   Sepsis ([place]) Active Problems:   Asthma   Benign essential HTN   Chronic kidney disease (CKD), stage V (HCC)   Chronic systolic CHF (congestive heart failure) (HCC)   Depression with anxiety   Type 2 diabetes mellitus with diabetic chronic kidney disease (HCC)   MI (mitral incompetence)   Carcinoma of upper-inner quadrant of left breast in female, estrogen receptor positive ([place])   Metastasis from malignant tumor of breast (HCC)   Anemia of chronic disease   Lactic acidosis   Sepsis secondary to infected Left foot wound / Lactic acidosis: POA: temp 101.9  HR 92 RR 18 BP 134/75 lactic acid 2.1.  Leukocytosis 25.5  Source foot infection. Started sepsis order set. Lactic acidosis : She received IV fluids in the ED, Lactic acid normalized. Given Vanco, cefepime and Flagyl in the ED. Continue vancomycin and cefepime,  pharmacy consulted. Adequate pain control with oxycodone as needed. Continue gentle hydration. Podiatry consulted,   MRI consistent with osteomyelitis. Patient underwent fifth ray amputation tolerated well. Infectious disease consulted to guide about antibiotic regimen. Continue vancomycin for known MRSA and continue cefepime and Flagyl. Wound care consulted, needs outpatient wound   ID recommended Hickman catheter for IV antibiotics. Vascular surgery consulted for Hickman's catheter.  AKI on CKD stage III:  >>> Improving Baseline creatinine remains around 2.1.   Avoid nephrotoxic medications, continue gentle hydration,  recheck labs tomorrow morning.  Anemia of chronic disease: No obvious bleeding, hemoglobin is stable.  Hypertension:  Continue home medications.  Diabetes mellitus Continue Lantus 55 units at night. Sliding scale,  obtain hemoglobin A1c.  Elevated LFTs:  Could be due to metastatic lesion. Improved.  Metastatic breast cancer; She follows up outpatient with oncologist,  Hem / Oncology consulted.  Outpatient follow-up  Depression with anxiety: Resume home meds   DVT prophylaxis:  Lovenox Code Status: Full Family Communication: Discussed with patient,  Nobody at bedside. Disposition Plan: Anticipated discharge to SNF  Consults called: Podiatrist Patient is not medically clear awaiting cultures and antibiotic regimen at discharge.   Consultants:    Podiatry   Procedures:  Partial 5th ray amputation left foot Antimicrobials: Anti-infectives (From admission, onward)   Start     Dose/Rate Route Frequency Ordered Stop   04/04/20 1401  ceFAZolin (ANCEF) 1-4 GM/50ML-% IVPB       Note to Pharmacy: Genelle Bal   : cabinet override      04/04/20 1401 04/05/20 0214   04/04/20 1200  [MAR Hold]  vancomycin (VANCOREADY) IVPB 1500 mg/300 mL        (MAR  Hold since Wed 04/04/2020 at 1413.Hold Reason: Transfer to a Procedural area.)   1,500 mg 150 mL/hr over 120 Minutes Intravenous Every 48 hours 04/03/20 1229     04/03/20 1800  [MAR Hold]  ceFAZolin (ANCEF) IVPB 1 g/50  mL premix        (MAR Hold since Wed 04/04/2020 at 1413.Hold Reason: Transfer to a Procedural area.)   1 g 100 mL/hr over 30 Minutes Intravenous Every 8 hours 04/03/20 1229     04/02/20 2000  ceFAZolin (ANCEF) IVPB 1 g/50 mL premix  Status:  Discontinued        1 g 100 mL/hr over 30 Minutes Intravenous Every 12 hours 04/02/20 1521 04/03/20 1229   04/02/20 1000  vancomycin (VANCOCIN) IVPB 1000 mg/200 mL premix  Status:  Discontinued        1,000 mg 200 mL/hr over 60 Minutes Intravenous Every 48 hours 03/31/20 1041 03/31/20 1042   03/31/20 1800  vancomycin (VANCOCIN) IVPB 1000 mg/200 mL premix  Status:  Discontinued        1,000 mg 200 mL/hr over 60 Minutes Intravenous Every 48 hours 03/31/20 1042 04/03/20 1229   03/31/20 1000  vancomycin (VANCOREADY) IVPB 750 mg/150 mL  Status:  Discontinued        750 mg 150 mL/hr over 60 Minutes Intravenous Every 48 hours 03/30/20 1327 03/31/20 1041   03/30/20 2200  [MAR Hold]  metroNIDAZOLE (FLAGYL) IVPB 500 mg        (MAR Hold since Wed 04/04/2020 at 1413.Hold Reason: Transfer to a Procedural area.)   500 mg 100 mL/hr over 60 Minutes Intravenous Every 8 hours 03/30/20 1722     03/30/20 2000  ceFEPIme (MAXIPIME) 2 g in sodium chloride 0.9 % 100 mL IVPB  Status:  Discontinued        2 g 200 mL/hr over 30 Minutes Intravenous Every 24 hours 03/30/20 1723 04/02/20 1521   03/30/20 1800  vancomycin (VANCOCIN) IVPB 1000 mg/200 mL premix  Status:  Discontinued        1,000 mg 200 mL/hr over 60 Minutes Intravenous Every 24 hours 03/29/20 1735 03/30/20 1327   03/30/20 1400  ceFEPIme (MAXIPIME) 2 g in sodium chloride 0.9 % 100 mL IVPB  Status:  Discontinued        2 g 200 mL/hr over 30 Minutes Intravenous Every 24 hours 03/29/20 1735 03/30/20 1323   03/29/20 1700  vancomycin (VANCOCIN) IVPB 1000 mg/200 mL premix  Status:  Discontinued        1,000 mg 200 mL/hr over 60 Minutes Intravenous  Once 03/29/20 1658 03/29/20 1700   03/29/20 1630  vancomycin (VANCOREADY) IVPB  500 mg/100 mL        500 mg 100 mL/hr over 60 Minutes Intravenous  Once 03/29/20 1541 03/29/20 1834   03/29/20 1545  metroNIDAZOLE (FLAGYL) IVPB 500 mg  Status:  Discontinued        500 mg 100 mL/hr over 60 Minutes Intravenous Every 8 hours 03/29/20 1534 03/29/20 1714   03/29/20 1530  ceFEPIme (MAXIPIME) 2 g in sodium chloride 0.9 % 100 mL IVPB        2 g 200 mL/hr over 30 Minutes Intravenous  Once 03/29/20 1522 03/29/20 1631   03/29/20 1530  vancomycin (VANCOCIN) IVPB 1000 mg/200 mL premix        1,000 mg 200 mL/hr over 60 Minutes Intravenous  Once 03/29/20 1522 03/29/20 1834      Subjective: Patient was seen and examined at bedside.  No overnight  events.   Reports pain is better controlled.  Objective: Vitals:   04/04/20 1038 04/04/20 1039 04/04/20 1129 04/04/20 1417  BP: (!) 160/95 (!) 160/95 (!) 153/97 (!) 183/105  Pulse: 75 75 74 77  Resp:   16 20  Temp:   97.8 F (36.6 C) 98.4 F (36.9 C)  TempSrc:   Oral Oral  SpO2: 100% 100%  97%  Weight:    88.5 kg  Height:    5\' 2"  (1.575 m)    Intake/Output Summary (Last 24 hours) at 04/04/2020 1430 Last data filed at 04/04/2020 0945 Gross per 24 hour  Intake 270 ml  Output --  Net 270 ml   Filed Weights   03/29/20 1119 04/04/20 1417  Weight: 88 kg 88.5 kg    Examination:  General exam: Appears calm and comfortable  Respiratory system: Clear to auscultation. Respiratory effort normal. Cardiovascular system: S1 & S2 heard, RRR. No JVD, murmurs, rubs, gallops or clicks. No pedal edema. Gastrointestinal system: Abdomen is nondistended, soft and nontender. No organomegaly or masses felt. Normal bowel sounds heard. Central nervous system: Alert and oriented. No focal neurological deficits. Extremities:  Left partial toe amputation, covered in dressing. Skin: No rashes, lesions or ulcers Psychiatry: Judgement and insight appear normal. Mood & affect appropriate.     Data Reviewed: I have personally reviewed following labs  and imaging studies  CBC: Recent Labs  Lab 03/29/20 1127 03/30/20 0453 03/31/20 0530 04/01/20 0705 04/02/20 0710 04/03/20 0821 04/04/20 0505  WBC 25.2*   < > 19.6* 16.4* 11.5* 12.4* 13.9*  NEUTROABS 23.2*  --   --   --   --   --   --   HGB 8.6*   < > 7.5* 7.4* 8.1* 8.8* 8.4*  HCT 26.1*   < > 21.9* 23.0* 24.5* 26.9* 25.4*  MCV 91.3   < > 89.0 92.4 91.1 91.5 91.0  PLT 359   < > 346 433* 450* 504* 483*   < > = values in this interval not displayed.   Basic Metabolic Panel: Recent Labs  Lab 03/31/20 0530 04/01/20 0705 04/02/20 0710 04/03/20 0821 04/04/20 0505  NA 135 137 140 137 139  K 5.1 4.4 4.6 4.7 4.0  CL 106 109 112* 109 110  CO2 24 21* 21* 21* 21*  GLUCOSE 347* 96 140* 217* 82  BUN 39* 38* 33* 30* 27*  CREATININE 2.43* 2.39* 2.10* 1.92* 1.90*  CALCIUM 8.0* 8.4* 8.3* 8.6* 8.7*  MG 2.1  --  2.2 2.1 2.1  PHOS 3.9  --  2.6 2.3* 2.9   GFR: Estimated Creatinine Clearance: 31.3 mL/min (A) (by C-G formula based on SCr of 1.9 mg/dL (H)). Liver Function Tests: Recent Labs  Lab 03/29/20 1127 03/30/20 0453  AST 78* 30  ALT 55* 38  ALKPHOS 94 77  BILITOT 0.8 1.0  PROT 7.7 6.9  ALBUMIN 3.0* 2.6*   No results for input(s): LIPASE, AMYLASE in the last 168 hours. No results for input(s): AMMONIA in the last 168 hours. Coagulation Profile: Recent Labs  Lab 03/30/20 0453  INR 1.3*   Cardiac Enzymes: No results for input(s): CKTOTAL, CKMB, CKMBINDEX, TROPONINI in the last 168 hours. BNP (last 3 results) No results for input(s): PROBNP in the last 8760 hours. HbA1C: Recent Labs    04/04/20 0505  HGBA1C 8.0*   CBG: Recent Labs  Lab 04/03/20 1206 04/03/20 1607 04/03/20 2140 04/04/20 0749 04/04/20 1132  GLUCAP 267* 181* 145* 91 109*   Lipid Profile: No  results for input(s): CHOL, HDL, LDLCALC, TRIG, CHOLHDL, LDLDIRECT in the last 72 hours. Thyroid Function Tests: No results for input(s): TSH, T4TOTAL, FREET4, T3FREE, THYROIDAB in the last 72  hours. Anemia Panel: No results for input(s): VITAMINB12, FOLATE, FERRITIN, TIBC, IRON, RETICCTPCT in the last 72 hours. Sepsis Labs: Recent Labs  Lab 03/29/20 1127 03/29/20 1551 03/30/20 0453  PROCALCITON  --   --  6.02  LATICACIDVEN 2.1* 1.4  --     Recent Results (from the past 240 hour(s))  Aerobic Culture (superficial specimen)     Status: None   Collection Time: 03/26/20  3:10 PM   Specimen: Wound  Result Value Ref Range Status   Specimen Description   Final    WOUND Performed at [facility], [address], [place], [place] [address]    Special Requests   Final    LEFT FOOT Performed at [facility], [address] [place], [place] [address]    Gram Stain   Final    NO WBC SEEN MODERATE GRAM POSITIVE COCCI FEW GRAM NEGATIVE RODS    Culture   Final    FEW METHICILLIN RESISTANT STAPHYLOCOCCUS AUREUS MIXED WITHIN OTHER ORGANISMS Performed at [facility], [place] [address] [place], [place] [address]    Report Status 03/29/2020 FINAL  Final   Organism ID, Bacteria METHICILLIN RESISTANT STAPHYLOCOCCUS AUREUS  Final      Susceptibility   Methicillin resistant staphylococcus aureus - MIC*    CIPROFLOXACIN >=8 RESISTANT Resistant     ERYTHROMYCIN >=8 RESISTANT Resistant     GENTAMICIN <=0.5 SENSITIVE Sensitive     OXACILLIN >=4 RESISTANT Resistant     TETRACYCLINE <=1 SENSITIVE Sensitive     VANCOMYCIN 1 SENSITIVE Sensitive     TRIMETH/SULFA <=10 SENSITIVE Sensitive     CLINDAMYCIN 1 INTERMEDIATE Intermediate     RIFAMPIN 16 RESISTANT Resistant     Inducible Clindamycin NEGATIVE Sensitive     * FEW METHICILLIN RESISTANT STAPHYLOCOCCUS AUREUS  Blood culture (routine x 2)     Status: None   Collection Time: 03/29/20  3:51 PM   Specimen: BLOOD  Result Value Ref Range Status   Specimen Description BLOOD BLOOD RIGHT HAND  Final   Special Requests   Final    BOTTLES DRAWN AEROBIC AND ANAEROBIC Blood Culture adequate volume   Culture    Final    NO GROWTH 5 DAYS Performed at [facility], [address], [place], [place] [address]    Report Status 04/03/2020 FINAL  Final  Blood culture (routine x 2)     Status: None   Collection Time: 03/29/20  3:51 PM   Specimen: BLOOD  Result Value Ref Range Status   Specimen Description BLOOD LEFT ANTECUBITAL  Final   Special Requests   Final    BOTTLES DRAWN AEROBIC AND ANAEROBIC Blood Culture adequate volume   Culture   Final    NO GROWTH 5 DAYS Performed at [facility], [address], [place], [place] [address]    Report Status 04/03/2020 FINAL  Final  SARS Coronavirus 2 by RT PCR (hospital order, performed in [facility] hospital lab) Nasopharyngeal Nasopharyngeal Swab     Status: None   Collection Time: 03/29/20  5:15 PM   Specimen: Nasopharyngeal Swab  Result Value Ref Range Status   SARS Coronavirus 2 NEGATIVE NEGATIVE Final    Comment: (NOTE) SARS-CoV-2 target nucleic acids are NOT DETECTED.  The SARS-CoV-2 RNA is generally detectable in upper and  lower respiratory specimens during the acute phase of infection. The lowest concentration of SARS-CoV-2 viral copies this assay can detect is 250 copies / mL. A negative result does not preclude SARS-CoV-2 infection and should not be used as the sole basis for treatment or other patient management decisions.  A negative result may occur with improper specimen collection / handling, submission of specimen other than nasopharyngeal swab, presence of viral mutation(s) within the areas targeted by this assay, and inadequate number of viral copies (<250 copies / mL). A negative result must be combined with clinical observations, patient history, and epidemiological information.  Fact Sheet for Patients:   StrictlyIdeas.no  Fact Sheet for Healthcare Providers: BankingDealers.co.za  This test is not yet approved or  cleared by the [place] FDA and has  been authorized for detection and/or diagnosis of SARS-CoV-2 by FDA under an Emergency Use Authorization (EUA).  This EUA will remain in effect (meaning this test can be used) for the duration of the COVID-19 declaration under Section 564(b)(1) of the Act, 21 U.S.C. section 360bbb-3(b)(1), unless the authorization is terminated or revoked sooner.  Performed at [facility], [address] [place], [place] [address]   Aerobic/Anaerobic Culture (surgical/deep wound)     Status: None   Collection Time: 03/30/20  1:19 PM   Specimen: PATH Other; Tissue  Result Value Ref Range Status   Specimen Description   Final    WOUND Performed at [facility], [address], [place], [place] [address]    Special Requests   Final    LEFT 5TH TOE JOINT INFECTION Performed at [facility], [place], [place] [address]    Gram Stain NO WBC SEEN RARE GRAM POSITIVE COCCI IN PAIRS   Final   Culture   Final    FEW ESCHERICHIA COLI NO ANAEROBES ISOLATED Performed at [facility], [place] [address], [place],  [address]    Report Status 04/04/2020 FINAL  Final   Organism ID, Bacteria ESCHERICHIA COLI  Final      Susceptibility   Escherichia coli - MIC*    AMPICILLIN >=32 RESISTANT Resistant     CEFAZOLIN <=4 SENSITIVE Sensitive     CEFEPIME <=0.12 SENSITIVE Sensitive     CEFTAZIDIME <=1 SENSITIVE Sensitive     CEFTRIAXONE <=0.25 SENSITIVE Sensitive     CIPROFLOXACIN <=0.25 SENSITIVE Sensitive     GENTAMICIN >=16 RESISTANT Resistant     IMIPENEM <=0.25 SENSITIVE Sensitive     TRIMETH/SULFA >=320 RESISTANT Resistant     AMPICILLIN/SULBACTAM >=32 RESISTANT Resistant     PIP/TAZO <=4 SENSITIVE Sensitive     * FEW ESCHERICHIA COLI  MRSA PCR Screening     Status: None   Collection Time: 04/02/20  8:11 AM   Specimen: Nasopharyngeal  Result Value Ref Range Status   MRSA by PCR NEGATIVE NEGATIVE Final    Comment:        The GeneXpert MRSA Assay  (FDA approved for NASAL specimens only), is one component of a comprehensive MRSA colonization surveillance program. It is not intended to diagnose MRSA infection nor to guide or monitor treatment for MRSA infections. Performed at [facility], [address] [place],  [address]          Radiology Studies: DG Foot Complete Left  Result Date: 04/03/2020 CLINICAL DATA:  Fifth ray amputation EXAM: LEFT FOOT - COMPLETE 3+ VIEW COMPARISON:  None. FINDINGS: Interval postsurgical changes from transmetatarsal amputation of the fifth ray at  the level of the proximal diaphysis. Resection margin is smooth. Expected postoperative changes within the distal soft tissues at the amputation site. No acute fracture. No malalignment. No new areas of cortical irregularity. IMPRESSION: Interval postsurgical changes from transmetatarsal amputation of the fifth ray at the level of the proximal diaphysis. Electronically Signed   By: Davina Poke D.O.   On: 04/03/2020 16:23     Scheduled Meds: . [MAR Hold] amLODipine  10 mg Oral Daily  . [MAR Hold] aspirin EC  81 mg Oral Daily  . [MAR Hold] atenolol  100 mg Oral BID  . [MAR Hold] calcitRIOL  0.25 mcg Oral Daily  . [MAR Hold] cloNIDine  0.2 mg Oral BID  . [MAR Hold] docusate sodium  100 mg Oral BID  . [MAR Hold] enoxaparin (LOVENOX) injection  40 mg Subcutaneous Q24H  . [MAR Hold] famotidine  20 mg Oral Daily  . [MAR Hold] ferrous sulfate  325 mg Oral BID WC  . [MAR Hold] FLUoxetine  20 mg Oral BID  . [MAR Hold] insulin aspart  0-5 Units Subcutaneous QHS  . [MAR Hold] insulin aspart  0-9 Units Subcutaneous TID WC  . [MAR Hold] insulin detemir  25 Units Subcutaneous Daily  . [MAR Hold] senna  1 tablet Oral BID  . [MAR Hold] simvastatin  20 mg Oral QPM   Continuous Infusions: . ceFAZolin    . [MAR Hold]  ceFAZolin (ANCEF) IV 1 g (04/04/20 0531)  . [MAR Hold] metronidazole 500 mg (04/04/20 0945)  . [MAR Hold] vancomycin 1,500 mg  (04/04/20 1242)     LOS: 6 days    Time spent: 25 mins.    Shawna Clamp, MD [facility]   If 7PM-7AM, please contact night-coverage

## 2020-04-03 NOTE — Progress Notes (Signed)
[place] INFECTIOUS DISEASE PROGRESS NOTE Date of Admission:  03/29/2020     ID: [name] is a 64 y.o. 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 Cx with E coli. Pending   ROS  Eleven systems are reviewed and negative except per hpi  Medications:  Antibiotics Given (last 72 hours)    Date/Time Action Medication Dose Rate   03/31/20 1755 New Bag/Given   vancomycin (VANCOCIN) IVPB 1000 mg/200 mL premix 1,000 mg 200 mL/hr   03/31/20 2113 New Bag/Given   ceFEPIme (MAXIPIME) 2 g in sodium chloride 0.9 % 100 mL IVPB 2 g 200 mL/hr   03/31/20 2228 New Bag/Given   metroNIDAZOLE (FLAGYL) IVPB 500 mg 500 mg 100 mL/hr   04/01/20 0503 New Bag/Given   metroNIDAZOLE (FLAGYL) IVPB 500 mg 500 mg 100 mL/hr   04/01/20 1445 New Bag/Given   metroNIDAZOLE (FLAGYL) IVPB 500 mg 500 mg 100 mL/hr   04/01/20 2102 New Bag/Given   ceFEPIme (MAXIPIME) 2 g in sodium chloride 0.9 % 100 mL IVPB 2 g 200 mL/hr   04/01/20 2229 New Bag/Given   metroNIDAZOLE (FLAGYL) IVPB 500 mg 500 mg 100 mL/hr   04/02/20 0519 New Bag/Given   metroNIDAZOLE (FLAGYL) IVPB 500 mg 500 mg 100 mL/hr   04/02/20 1330 New Bag/Given   metroNIDAZOLE (FLAGYL) IVPB 500 mg 500 mg 100 mL/hr   04/02/20 2217 New Bag/Given   metroNIDAZOLE (FLAGYL) IVPB 500 mg 500 mg 100 mL/hr   04/02/20 2328 New Bag/Given   ceFAZolin (ANCEF) IVPB 1 g/50 mL premix 1 g 100 mL/hr   04/03/20 0000 New Bag/Given  [moved at this time  since vanc level was not back from pharmacy]   vancomycin (VANCOCIN) IVPB 1000 mg/200 mL premix 1,000 mg 200 mL/hr   04/03/20 6237 New Bag/Given   metroNIDAZOLE (FLAGYL) IVPB 500 mg 500 mg 100 mL/hr   04/03/20 1039 New Bag/Given   ceFAZolin (ANCEF) IVPB 1 g/50 mL premix 1 g 100 mL/hr     . amLODipine  10 mg Oral Daily  . aspirin EC  81 mg Oral Daily  . atenolol  100 mg Oral BID  . calcitRIOL  0.25 mcg Oral Daily  . cloNIDine  0.2 mg Oral BID  . docusate sodium  100 mg Oral BID  . enoxaparin (LOVENOX) injection  40 mg Subcutaneous Q24H  . famotidine  20 mg Oral Daily  . ferrous sulfate  325 mg Oral BID WC  . FLUoxetine  20 mg Oral BID  . insulin aspart  0-5 Units Subcutaneous QHS  . insulin aspart  0-9 Units Subcutaneous TID WC  . [START ON 04/04/2020] insulin detemir  25 Units Subcutaneous Daily  . phosphorus  500 mg Oral Once  . senna  1 tablet Oral BID  . simvastatin  20 mg Oral QPM    Objective: Vital signs in last 24 hours: Temp:  [97.9 F (36.6 C)-98.5 F (36.9 C)] 97.9 F (  36.6 C) (08/31 1210) Pulse Rate:  [76-80] 76 (08/31 1210) Resp:  [20] 20 (08/31 0346) BP: (154-172)/(90-94) 171/90 (08/31 1210) SpO2:  [95 %-98 %] 98 % (08/31 1210) Constitutional: He is oriented to person, place, and time. He appears well-developed and well-nourished. No distress.  Mouth/Throat: Oropharynx is clear and moist. .  Cardiovascular: Normal rate, regular rhythm and normal heart sounds. Pulmonary/Chest: Effort normal and breath sounds normal. No respiratory distress. He has no wheezes.  Abdominal: Soft. Bowel sounds are normal. He exhibits no distension. There is no tenderness.  Lymphadenopathy: He has no cervical adenopathy.  Neurological: He is alert and oriented to person, place, and time.  Skin: L foot wrapped R leg with edema, wrapped post op Lab Results Recent Labs    04/02/20 0710 04/03/20 0821  WBC 11.5* 12.4*  HGB 8.1* 8.8*  HCT 24.5* 26.9*  NA 140 137  K 4.6 4.7  CL  112* 109  CO2 21* 21*  BUN 33* 30*  CREATININE 2.10* 1.92*    Microbiology: Results for orders placed or performed during the hospital encounter of 03/29/20  Blood culture (routine x 2)     Status: None   Collection Time: 03/29/20  3:51 PM   Specimen: BLOOD  Result Value Ref Range Status   Specimen Description BLOOD BLOOD RIGHT HAND  Final   Special Requests   Final    BOTTLES DRAWN AEROBIC AND ANAEROBIC Blood Culture adequate volume   Culture   Final    NO GROWTH 5 DAYS Performed at [facility], [address], [place], [place] [address]    Report Status 04/03/2020 FINAL  Final  Blood culture (routine x 2)     Status: None   Collection Time: 03/29/20  3:51 PM   Specimen: BLOOD  Result Value Ref Range Status   Specimen Description BLOOD LEFT ANTECUBITAL  Final   Special Requests   Final    BOTTLES DRAWN AEROBIC AND ANAEROBIC Blood Culture adequate volume   Culture   Final    NO GROWTH 5 DAYS Performed at [facility], [address], [place], [place] [address]    Report Status 04/03/2020 FINAL  Final  SARS Coronavirus 2 by RT PCR (hospital order, performed in [facility] hospital lab) Nasopharyngeal Nasopharyngeal Swab     Status: None   Collection Time: 03/29/20  5:15 PM   Specimen: Nasopharyngeal Swab  Result Value Ref Range Status   SARS Coronavirus 2 NEGATIVE NEGATIVE Final    Comment: (NOTE) SARS-CoV-2 target nucleic acids are NOT DETECTED.  The SARS-CoV-2 RNA is generally detectable in upper and lower respiratory specimens during the acute phase of infection. The lowest concentration of SARS-CoV-2 viral copies this assay can detect is 250 copies / mL. A negative result does not preclude SARS-CoV-2 infection and should not be used as the sole basis for treatment or other patient management decisions.  A negative result may occur with improper specimen collection / handling, submission of specimen other than nasopharyngeal swab, presence of  viral mutation(s) within the areas targeted by this assay, and inadequate number of viral copies (<250 copies / mL). A negative result must be combined with clinical observations, patient history, and epidemiological information.  Fact Sheet for Patients:   StrictlyIdeas.no  Fact Sheet for Healthcare Providers: BankingDealers.co.za  This test is not yet approved or  cleared by the [place] FDA and has been authorized for detection and/or diagnosis of SARS-CoV-2 by FDA under an Emergency Use Authorization (EUA).  This EUA will remain in effect (meaning this test can be used) for the duration of the COVID-19 declaration under Section 564(b)(1) of the Act, 21 U.S.C. section 360bbb-3(b)(1), unless the authorization is terminated or revoked sooner.  Performed at [facility], [address] [place], [place] [address]   Aerobic/Anaerobic Culture (surgical/deep wound)     Status: None (Preliminary result)   Collection Time: 03/30/20  1:19 PM   Specimen: PATH Other; Tissue  Result Value Ref Range Status   Specimen Description   Final    WOUND Performed at [facility], [address] [place], [place] [address]    Special Requests   Final    LEFT 5TH TOE JOINT INFECTION Performed at [facility], [address] [place], [place] [address]    Gram Stain   Final    NO WBC SEEN RARE GRAM POSITIVE COCCI IN PAIRS Performed at [facility], [place] [address], [place], [place] [address]    Culture   Final    FEW ESCHERICHIA COLI NO ANAEROBES ISOLATED; CULTURE IN PROGRESS FOR 5 DAYS    Report Status PENDING  Incomplete   Organism ID, Bacteria ESCHERICHIA COLI  Final      Susceptibility   Escherichia coli - MIC*    AMPICILLIN >=32 RESISTANT Resistant     CEFAZOLIN <=4 SENSITIVE Sensitive     CEFEPIME <=0.12 SENSITIVE Sensitive     CEFTAZIDIME <=1 SENSITIVE Sensitive     CEFTRIAXONE <=0.25 SENSITIVE  Sensitive     CIPROFLOXACIN <=0.25 SENSITIVE Sensitive     GENTAMICIN >=16 RESISTANT Resistant     IMIPENEM <=0.25 SENSITIVE Sensitive     TRIMETH/SULFA >=320 RESISTANT Resistant     AMPICILLIN/SULBACTAM >=32 RESISTANT Resistant     PIP/TAZO <=4 SENSITIVE Sensitive     * FEW ESCHERICHIA COLI  MRSA PCR Screening     Status: None   Collection Time: 04/02/20  8:11 AM   Specimen: Nasopharyngeal  Result Value Ref Range Status   MRSA by PCR NEGATIVE NEGATIVE Final    Comment:        The GeneXpert MRSA Assay (FDA approved for NASAL specimens only), is one component of a comprehensive MRSA colonization surveillance program. It is not intended to diagnose MRSA infection nor to guide or monitor treatment for MRSA infections. Performed at [facility], [address] [place], [place] [address]     Studies/Results: No results found.  Assessment/Plan: ASHAYLA SUBIA is a 64 y.o. 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 Recent otpt cx MRSA. Current cx E coli but still pending. S/p 5th MT ray amp 8/27.  8/30 - wbc down - path pending Recommendations Vanco for known MRSA - monitor cr and if worsens change to daptomycin Change cefepime to cefazolin and cont flagyl pending final cultures and bone path. Place Hickman line for IV abx - even if margins negative given amount of infection and inflammation will benefit. CKD - follows with Dr Abigail Butts.   Thank you very much for the consult. Will follow with you.  Leonel Ramsay   04/03/2020, 2:08 PM

## 2020-04-03 NOTE — Progress Notes (Signed)
Physical Therapy Treatment Patient Details Name: [name] MRN: [id number] DOB: 10-01-1955 Today's Date: 04/03/2020    History of Present Illness Per MD note:Renella A Bond is a 64 y.o. female with medical history significant of metastatic breast cancer, diabetes mellitus, CKD stage III, CHF, presents in the emergency department with open draining left foot wound and associated fever.  Patient reports having this wound for a while,  she has recently established care with the wound clinic but it has recently gotten worse and started draining.  She reports seeing her primary care physician and was started on Keflex and then which was recently switched to Bactrim but she is not seeing any improvement.  She reports having fever,  associated chills and body ache.  she denies any shortness of breath,  cough. 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    PT Comments    Pt seated in recliner chair upon arrival to room and excited/agreeable to PT session. Pt attempted to perform sit <> stand with max+1 and use of RW. Pt able to clear hips however unable to extend hips and R knee to come into full upright standing. Clinician placed foot under pt's L foot to ensure maintaining NWB status on LLE. Pt then performed RLE open and closed chain therex including resistive leg presses to promote strengthening in glutes/quads/gastrocs and open chain therex on LLE. Emphasis placed on tricep presses and closed chain therex with RLE for carryover to improve transfers/functional mobility and decrease assistance level. Pt limited overall due to fatigue and muscle weakness. Continue to recommend STR at discharge to maximize functional mobility prior to discharge home.   Follow Up Recommendations  SNF     Equipment Recommendations  Wheelchair (measurements PT);Wheelchair cushion (measurements PT)    Recommendations for Other Services        Precautions / Restrictions Precautions Precautions: Fall Restrictions Weight Bearing Restrictions: Yes LLE Weight Bearing: Non weight bearing    Mobility  Bed Mobility               General bed mobility comments: not performed - pt sitting up in recliner chair at arrival  Transfers Overall transfer level: Needs assistance Equipment used: Rolling walker (2 wheeled) Transfers: Sit to/from Stand Sit to Stand: Max assist         General transfer comment: Pt attempted sit <> stand transfer from recliner chair with max A and use of RW; pt able to clear hips however unable to come into complete upright standing remaining flexed forward at hips and R knee flexed; clinician placed foot under pt's L foot to maintain NWB   Ambulation/Gait             General Gait Details: not safe to attempt   Stairs             Wheelchair Mobility    Modified Rankin (Stroke Patients Only)       Balance Overall balance assessment: Needs assistance Sitting-balance support: Feet supported Sitting balance-Leahy Scale: Good Sitting balance - Comments: no LOB noted while pt sitting in recliner chair without back support   Standing balance support: Bilateral upper extremity supported Standing balance-Leahy Scale: Poor Standing balance comment: pt unable to get to full upright stance or maintain balance despite max A and BUE support on RW  Cognition Arousal/Alertness: Awake/alert Behavior During Therapy: WFL for tasks assessed/performed Overall Cognitive Status: Within Functional Limits for tasks assessed                                        Exercises Other Exercises Other Exercises: pt performed open and closed chain activities of RLE and open chain only exercises on LLE Other Exercises: bilat. tricep presses, resistive RLE leg presses, and open chain BUE therex x 10 reps, 2 sets    General Comments         Pertinent Vitals/Pain Pain Assessment: No/denies pain    Home Living                      Prior Function            PT Goals (current goals can now be found in the care plan section) Acute Rehab PT Goals Patient Stated Goal: to walk PT Goal Formulation: Patient unable to participate in goal setting Time For Goal Achievement: 04/14/20 Potential to Achieve Goals: Fair Progress towards PT goals: Progressing toward goals    Frequency    Min 2X/week      PT Plan Current plan remains appropriate    Co-evaluation              AM-PAC PT "6 Clicks" Mobility   Outcome Measure  Help needed turning from your back to your side while in a flat bed without using bedrails?: A Lot Help needed moving from lying on your back to sitting on the side of a flat bed without using bedrails?: A Lot Help needed moving to and from a bed to a chair (including a wheelchair)?: Total Help needed standing up from a chair using your arms (e.g., wheelchair or bedside chair)?: Total Help needed to walk in hospital room?: Total Help needed climbing 3-5 steps with a railing? : Total 6 Click Score: 8    End of Session Equipment Utilized During Treatment: Gait belt Activity Tolerance: Patient tolerated treatment well;Patient limited by fatigue Patient left: in chair;with call bell/phone within reach Nurse Communication: Mobility status PT Visit Diagnosis: Repeated falls (R29.6);Muscle weakness (generalized) (M62.81);Difficulty in walking, not elsewhere classified (R26.2)     Time: 3875-6433 PT Time Calculation (min) (ACUTE ONLY): 30 min  Charges:                        Vale Haven, SPT   Vale Haven 04/03/2020, 4:24 PM

## 2020-04-03 NOTE — Progress Notes (Signed)
[name]   DOB:[birth year]   [id number]    Subjective: Patient resting comfortably chair.  States her fevers improved.  Wound VAC is off.  She is concerned about going to rehab/IV antibiotics.  Interested in going home; however wants to do what is best for her.  Objective:  Vitals:   04/03/20 0912 04/03/20 1210  BP: (!) 172/91 (!) 171/90  Pulse:  76  Resp:    Temp:  97.9 F (36.6 C)  SpO2:  98%     Intake/Output Summary (Last 24 hours) at 04/03/2020 1710 Last data filed at 04/03/2020 1300 Gross per 24 hour  Intake 1335.11 ml  Output 0 ml  Net 1335.11 ml    Physical Exam Constitutional:      Comments: Resting comfortably in the chair.  HENT:     Head: Normocephalic and atraumatic.     Mouth/Throat:     Pharynx: No oropharyngeal exudate.  Eyes:     Pupils: Pupils are equal, round, and reactive to light.  Cardiovascular:     Rate and Rhythm: Normal rate and regular rhythm.  Pulmonary:     Effort: No respiratory distress.     Breath sounds: No wheezing.     Comments: Decreased air entry bilaterally at the bases. Abdominal:     General: Bowel sounds are normal. There is no distension.     Palpations: Abdomen is soft. There is no mass.     Tenderness: There is no abdominal tenderness. There is no guarding or rebound.  Musculoskeletal:        General: No tenderness. Normal range of motion.     Cervical back: Normal range of motion and neck supple.     Comments: Left foot in bandage.  Skin:    General: Skin is warm.  Neurological:     Mental Status: She is alert and oriented to person, place, and time.  Psychiatric:        Mood and Affect: Affect normal.      Labs:  Lab Results  Component Value Date   WBC 12.4 (H) 04/03/2020   HGB 8.8 (L) 04/03/2020   HCT 26.9 (L) 04/03/2020   MCV 91.5 04/03/2020   PLT 504 (H) 04/03/2020   NEUTROABS 23.2 (H) 03/29/2020    Lab Results  Component Value Date   NA 137 04/03/2020   K 4.7 04/03/2020   CL 109 04/03/2020    CO2 21 (L) 04/03/2020    Studies:  DG Foot Complete Left  Result Date: 04/03/2020 CLINICAL DATA:  Fifth ray amputation EXAM: LEFT FOOT - COMPLETE 3+ VIEW COMPARISON:  None. FINDINGS: Interval postsurgical changes from transmetatarsal amputation of the fifth ray at the level of the proximal diaphysis. Resection margin is smooth. Expected postoperative changes within the distal soft tissues at the amputation site. No acute fracture. No malalignment. No new areas of cortical irregularity. IMPRESSION: Interval postsurgical changes from transmetatarsal amputation of the fifth ray at the level of the proximal diaphysis. Electronically Signed   By: Davina Poke D.O.   On: 04/03/2020 16:23    Carcinoma of upper-inner quadrant of left breast in female, estrogen receptor positive [facility]) # 64 year old female patient with a history of metastatic breast cancer CKD stage IV; poorly controlled diabetes is currently admitted to hospital for left foot cellulitis/osteomyelitis  #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  I reviewed the PET scan again [March 25, 2020]-shows progressive disease adrenals/bone/pleural parenchymal-consistent with recently rising tumor marker.  Reviewed with the  patient that response to therapy in general decreases with subsequent lines of therapy.  Also given a complicated infection issues [see below]-the chemotherapy is to be delayed by few weeks.  #Left lower extremity infection-cellulitis/osteomyelitis status post amputation-on broad-spectrum antibiotics; discussed with Dr. Vickki Muff.  Will check with ID regarding duration of antibiotics/clearance to proceed with chemotherapy.  # Chronic kidney disease - stage IV-GFR 19; STABLE: [[name]/ # Anemia- hemoglobin today-8-9/CKD-STABLE; On PO iron.  #Prognosis: Understands prognosis is guarded given progressive malignancy/multiple other medical issues as above.  Will reach out to patient's son/and  also brother as per patient request.   Cammie Sickle, MD 04/03/2020  5:10 PM

## 2020-04-03 NOTE — Progress Notes (Signed)
Pharmacy Antibiotic Note  [name] is a 64 y.o. female admitted on 03/29/2020 with MRSA cellulitis pending I&D.  Pharmacy has been consulted for  vancomycin and cefepime dosing. Patient has PMH of CKD stage V, DM,  CHF, and metastatic breast cancer. Since admission her renal function has improved (perhaps currently below baseline on 8/31).  Patient has I&D 8/27  Today, 04/03/2020  Day #5 antibiotics - currently vancomycin + cefazolin + metronidzole  SCr improved to now < 2 (appears below baseline)  Will order vancomycin levels for 8/30 @1730   WBC = 12.4  Afebrile  Cultures - 8/23 (wound clinic): MRSA; 8/27 L 5th toe: E. Coli  OR pathology: acute osteomyelitis (inflammation at bone and soft tissue margins)  Vancomycin trough 1gm IV q48 (prior to 4th dose) = 10 mcg/mL  Plan:     Based on low trough, adjust vancomycin to 1500mg  IV q48h (goal trough 15-20).  Suspect q24h regimen would be too much for her kidneys  Adjust cefazolin to 2gm IV q8h for now based on estimated CrCl  Daily SCr while on vancomycin to assess renal function  Per ID, plan is IV antibiotics at discharge.    Height: 5\' 2"  (157.5 cm) Weight: 88 kg (194 lb) IBW/kg (Calculated) : 50.1  Temp (24hrs), Avg:98.1 F (36.7 C), Min:97.9 F (36.6 C), Max:98.5 F (36.9 C)  Recent Labs  Lab 03/29/20 1127 03/29/20 1127 03/29/20 1551 03/30/20 0453 03/31/20 0530 04/01/20 0705 04/02/20 0710 04/02/20 1716 04/03/20 0821  WBC 25.2*   < >  --  21.2* 19.6* 16.4* 11.5*  --  12.4*  CREATININE 2.74*   < >  --  2.53* 2.43* 2.39* 2.10*  --  1.92*  LATICACIDVEN 2.1*  --  1.4  --   --   --   --   --   --   VANCOTROUGH  --   --   --   --   --   --   --  10*  --    < > = values in this interval not displayed.    Estimated Creatinine Clearance: 30.9 mL/min (A) (by C-G formula based on SCr of 1.92 mg/dL (H)).    Allergies  Allergen Reactions  . Chlorhexidine   . Fish-Derived Products   .  Sulfamethoxazole-Trimethoprim Other (See Comments)    Antimicrobials this admission: 8/26 Cefepime >> 8/30 8/26 Vancomycin  >>  8/24 Metronidazole >> 8/30 Cefazolin>>    Microbiology results: 8/23 WCx: MRSA 8/27 WCx: E coli 8/26 BCx: NGTD 8/26 SARS CoV-2: negative 8/30 MRSA PCR: negative  Thank you for allowing pharmacy to be a part of this patient's care.  Doreene Eland, PharmD, BCPS.   Work Cell: [phone] 04/03/2020 12:19 PM  \

## 2020-04-03 NOTE — Progress Notes (Signed)
Anticoagulation monitoring(Lovenox):  64 yo female ordered Lovenox 30 mg Q24h  Filed Weights   03/29/20 1119  Weight: 88 kg (194 lb)   Body mass index is 35.48 kg/m.   Lab Results  Component Value Date   CREATININE 1.92 (H) 04/03/2020   CREATININE 2.10 (H) 04/02/2020   CREATININE 2.39 (H) 04/01/2020   Estimated Creatinine Clearance: 30.9 mL/min (A) (by C-G formula based on SCr of 1.92 mg/dL (H)). Hemoglobin & Hematocrit     Component Value Date/Time   HGB 8.8 (L) 04/03/2020 0821   HGB 12.5 06/06/2014 1102   HCT 26.9 (L) 04/03/2020 0821   HCT 37.6 06/06/2014 1102     Per Protocol for Patient with estCrcl > 30 ml/min and BMI < 40, will transition to Lovenox 40 mg Q24h.

## 2020-04-04 ENCOUNTER — Telehealth: Payer: Self-pay | Admitting: Internal Medicine

## 2020-04-04 ENCOUNTER — Encounter
Admission: EM | Disposition: A | Discharge status: Skilled Nursing Facility | Payer: Self-pay | Source: Home / Self Care | Attending: Family Medicine

## 2020-04-04 ENCOUNTER — Other Ambulatory Visit (INDEPENDENT_AMBULATORY_CARE_PROVIDER_SITE_OTHER): Payer: Self-pay | Admitting: Vascular Surgery

## 2020-04-04 DIAGNOSIS — A419 Sepsis, unspecified organism: Secondary | ICD-10-CM

## 2020-04-04 DIAGNOSIS — M86172 Other acute osteomyelitis, left ankle and foot: Secondary | ICD-10-CM

## 2020-04-04 DIAGNOSIS — M869 Osteomyelitis, unspecified: Secondary | ICD-10-CM

## 2020-04-04 DIAGNOSIS — L0390 Cellulitis, unspecified: Secondary | ICD-10-CM

## 2020-04-04 HISTORY — PX: CENTRAL LINE INSERTION-TUNNELED: CATH118291

## 2020-04-04 LAB — BASIC METABOLIC PANEL
Anion gap: 8 (ref 5–15)
BUN: 27 mg/dL — ABNORMAL HIGH (ref 8–23)
CO2: 21 mmol/L — ABNORMAL LOW (ref 22–32)
Calcium: 8.7 mg/dL — ABNORMAL LOW (ref 8.9–10.3)
Chloride: 110 mmol/L (ref 98–111)
Creatinine, Ser: 1.9 mg/dL — ABNORMAL HIGH (ref 0.44–1.00)
GFR calc Af Amer: 32 mL/min — ABNORMAL LOW (ref >60)
GFR calc non Af Amer: 28 mL/min — ABNORMAL LOW (ref >60)
Glucose, Bld: 82 mg/dL (ref 70–99)
Potassium: 4 mmol/L (ref 3.5–5.1)
Sodium: 139 mmol/L (ref 135–145)

## 2020-04-04 LAB — AEROBIC/ANAEROBIC CULTURE W GRAM STAIN (SURGICAL/DEEP WOUND): Gram Stain: NONE SEEN

## 2020-04-04 LAB — CBC
HCT: 25.4 % — ABNORMAL LOW (ref 36.0–46.0)
Hemoglobin: 8.4 g/dL — ABNORMAL LOW (ref 12.0–15.0)
MCH: 30.1 pg (ref 26.0–34.0)
MCHC: 33.1 g/dL (ref 30.0–36.0)
MCV: 91 fL (ref 80.0–100.0)
Platelets: 483 10*3/uL — ABNORMAL HIGH (ref 150–400)
RBC: 2.79 MIL/uL — ABNORMAL LOW (ref 3.87–5.11)
RDW: 15.7 % — ABNORMAL HIGH (ref 11.5–15.5)
WBC: 13.9 10*3/uL — ABNORMAL HIGH (ref 4.0–10.5)
nRBC: 0.1 % (ref 0.0–0.2)

## 2020-04-04 LAB — GLUCOSE, CAPILLARY
Glucose-Capillary: 145 mg/dL — ABNORMAL HIGH (ref 70–99)
Glucose-Capillary: 91 mg/dL (ref 70–99)
Glucose-Capillary: 109 mg/dL — ABNORMAL HIGH (ref 70–99)
Glucose-Capillary: 88 mg/dL (ref 70–99)
Glucose-Capillary: 77 mg/dL (ref 70–99)
Glucose-Capillary: 120 mg/dL — ABNORMAL HIGH (ref 70–99)
Glucose-Capillary: 163 mg/dL — ABNORMAL HIGH (ref 70–99)
Glucose-Capillary: 230 mg/dL — ABNORMAL HIGH (ref 70–99)

## 2020-04-04 LAB — HEMOGLOBIN A1C
Hgb A1c MFr Bld: 8 % — ABNORMAL HIGH (ref 4.8–5.6)
Mean Plasma Glucose: 182.9 mg/dL

## 2020-04-04 LAB — MAGNESIUM: Magnesium: 2.1 mg/dL (ref 1.7–2.4)

## 2020-04-04 LAB — PHOSPHORUS: Phosphorus: 2.9 mg/dL (ref 2.5–4.6)

## 2020-04-04 SURGERY — CENTRAL LINE INSERTION-TUNNELED
Anesthesia: Moderate Sedation

## 2020-04-04 MED ORDER — MIDAZOLAM HCL 5 MG/5ML IJ SOLN
INTRAMUSCULAR | Status: AC
  Filled 2020-04-04: qty 5

## 2020-04-04 MED ORDER — MIDAZOLAM HCL 2 MG/2ML IJ SOLN
INTRAMUSCULAR | Status: DC | PRN
  Administered 2020-04-04 (×3): 1 mg via INTRAVENOUS
  Administered 2020-04-04: 2 mg via INTRAVENOUS

## 2020-04-04 MED ORDER — FENTANYL CITRATE (PF) 100 MCG/2ML IJ SOLN
INTRAMUSCULAR | Status: AC
Start: 2020-04-04 — End: 2020-04-05
  Filled 2020-04-04: qty 2

## 2020-04-04 MED ORDER — DEXTROSE 50 % IV SOLN: 12.5000 g | Freq: Once | INTRAVENOUS | Status: AC

## 2020-04-04 MED ORDER — SODIUM CHLORIDE 0.9 % IV SOLN: INTRAVENOUS | Status: DC

## 2020-04-04 MED ORDER — DEXTROSE 50 % IV SOLN
INTRAVENOUS | Status: AC
  Administered 2020-04-04: 12.5 g via INTRAVENOUS
  Filled 2020-04-04: qty 50

## 2020-04-04 MED ORDER — CEFAZOLIN SODIUM-DEXTROSE 1-4 GM/50ML-% IV SOLN
INTRAVENOUS | Status: AC
  Administered 2020-04-04: 1 g via INTRAVENOUS
  Filled 2020-04-04: qty 50

## 2020-04-04 MED ORDER — FENTANYL CITRATE (PF) 100 MCG/2ML IJ SOLN
INTRAMUSCULAR | Status: DC | PRN
Start: 2020-04-04 — End: 2020-04-04
  Administered 2020-04-04 (×2): 50 ug via INTRAVENOUS

## 2020-04-04 SURGICAL SUPPLY — 12 items
BIOPATCH RED 1 DISK 7.0 (GAUZE/BANDAGES/DRESSINGS) ×1 IMPLANT
BIOPATCH RED 1IN DISK 7.0MM (GAUZE/BANDAGES/DRESSINGS) ×1
DERMABOND ADVANCED (GAUZE/BANDAGES/DRESSINGS) ×2
DERMABOND ADVANCED .7 DNX12 (GAUZE/BANDAGES/DRESSINGS) IMPLANT
GLIDECATH 4FR STR (CATHETERS) ×2 IMPLANT
GLIDEWIRE STIFF .35X180X3 HYDR (WIRE) ×2 IMPLANT
GUIDEWIRE SUPER STIFF .035X180 (WIRE) ×2 IMPLANT
KIT SINGLE LUMEN POWERLINE 5FR (CATHETERS) ×2 IMPLANT
PACK ANGIOGRAPHY (CUSTOM PROCEDURE TRAY) ×2 IMPLANT
SHEATH BRITE TIP 4FRX11 (SHEATH) ×2 IMPLANT
SUT MNCRL AB 4-0 PS2 18 (SUTURE) ×2 IMPLANT
SUT SILK 0 FSL (SUTURE) ×2 IMPLANT

## 2020-04-04 NOTE — Progress Notes (Signed)
PT Cancellation Note  Patient Details Name: [name] MRN: [id number] DOB: [birth year]   Cancelled Treatment:    Reason Eval/Treat Not Completed: Other (comment). Currently out of room and unavailable for therapy. Will re-attempt next available date.   Arvella Massingale 04/04/2020, 3:02 PM  Greggory Stallion, PT, DPT [phone]

## 2020-04-04 NOTE — Interval H&P Note (Signed)
History and Physical Interval Note:  04/04/2020 4:11 PM  [name]  has presented today for surgery, with the diagnosis of Long Term ABX.  The various methods of treatment have been discussed with the patient and family. After consideration of risks, benefits and other options for treatment, the patient has consented to  Procedure(s): CENTRAL LINE INSERTION-TUNNELED (N/A) as a surgical intervention.  The patient's history has been reviewed, patient examined, no change in status, stable for surgery.  I have reviewed the patient's chart and labs.  Questions were answered to the patient's satisfaction.     Hortencia Pilar

## 2020-04-04 NOTE — Op Note (Signed)
OPERATIVE NOTE   PROCEDURE: 1. Insertion of tunneled Hickman catheter left IJ approach with ultrasound and fluoroscopic guidance.  PRE-OPERATIVE DIAGNOSIS: Osteomyelitis requiring parenteral antibiotics  POST-OPERATIVE DIAGNOSIS: Same  SURGEON: Hortencia Pilar.  ANESTHESIA: Conscious sedation was administered under my direct supervision by the interventional radiology RN. IV Versed plus fentanyl were utilized. Continuous ECG, pulse oximetry and blood pressure was monitored throughout the entire procedure. Conscious sedation was for a total of 67 minutes.  ESTIMATED BLOOD LOSS: Minimal cc  CONTRAST USED:  None  FLUOROSCOPY TIME: 5.0 minutes  INDICATIONS:   Gloria A [place]is a 64 y.o. y.o. female who presents with osteomyelitis she will require parenteral antibiotics and therefore is undergoing placement of a appropriate IV access for administration of antibiotics at home.  Risks and benefits of been reviewed all questions answered patient agrees to proceed..  DESCRIPTION: After obtaining full informed written consent, the patient was positioned supine. The left neck and chest wall was prepped and draped in a sterile fashion. Ultrasound was placed in a sterile sleeve. Ultrasound was utilized to identify the left internal jugular vein which is noted to be echolucent and compressible indicating patency. Image is recorded for the permanent record. Under direct ultrasound visualization a micro-needle is inserted into the vein followed by the micro-wire. Micro-sheath was then advanced and a J wire is inserted without difficulty under fluoroscopic guidance. Small counterincision was made at the wire insertion site.  The wire is then measured and the Hickman catheter is trimmed to 22 cm tip to cuff.  Dilator peel-away sheath is then inserted over the wire and the catheter is then inserted without difficulty.  Peel-away sheath is removed.  Catheter is noted to be sitting in the innominate this is too  short and therefore a Glidewire was reintroduced and this catheter was removed.  A new catheter was delivered onto the table and trimmed to 26 cm.  Attempts at advancing the catheter over the Glidewire were not successful ultimately the Glidewire was exchanged for an Amplatz wire and a 4 French sheath dilator and the peel-away sheath from the new kit was able to secure access and the 26 cm catheter advanced over the Amplatz wire through the peel-away sheath.  Purulent sheath was removed.  Wire was then removed under fluoroscopic guidance and the tip of the Hickman catheter is noted to be at the atrial caval junction in excellent position.  The catheter was then flushed with heparinized saline.  Counterincision was closed with a 4-0 Monocryl and the catheter secured to the skin of the left chest wall with 0 silk. A sterile dressing is applied with a Biopatch.  COMPLICATIONS: None  CONDITION: Good  Hortencia Pilar [place] [facility]. Office:  [phone]   04/04/2020,5:12 PM

## 2020-04-04 NOTE — Telephone Encounter (Signed)
Long discussion with the patient's brother Mr. [phone]; discussed the unfortunate situation of left foot cellulitis/osteomyelitis needing amputation toes.  Understands that she will need long-term antibiotics; which will unfortunately interfere with the need for chemotherapy given the recent progression of disease noted on the PET scan.  Defer to hospitalist service for disposition planning/ recommendations.

## 2020-04-04 NOTE — Treatment Plan (Signed)
Consent signed for catheter placement at this time. Writer witnessed.

## 2020-04-04 NOTE — Progress Notes (Signed)
[facility] INFECTIOUS DISEASE PROGRESS NOTE Date of Admission:  03/29/2020     ID: [name] is a 64 y.o. 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 Cx with E coli. Pending   ROS  Eleven systems are reviewed and negative except per hpi  Medications:  Antibiotics Given (last 72 hours)    Date/Time Action Medication Dose Rate   04/01/20 1445 New Bag/Given   metroNIDAZOLE (FLAGYL) IVPB 500 mg 500 mg 100 mL/hr   04/01/20 2102 New Bag/Given   ceFEPIme (MAXIPIME) 2 g in sodium chloride 0.9 % 100 mL IVPB 2 g 200 mL/hr   04/01/20 2229 New Bag/Given   metroNIDAZOLE (FLAGYL) IVPB 500 mg 500 mg 100 mL/hr   04/02/20 0519 New Bag/Given   metroNIDAZOLE (FLAGYL) IVPB 500 mg 500 mg 100 mL/hr   04/02/20 1330 New Bag/Given   metroNIDAZOLE (FLAGYL) IVPB 500 mg 500 mg 100 mL/hr   04/02/20 2217 New Bag/Given   metroNIDAZOLE (FLAGYL) IVPB 500 mg 500 mg 100 mL/hr   04/02/20 2328 New Bag/Given   ceFAZolin (ANCEF) IVPB 1 g/50 mL premix 1 g 100 mL/hr   04/03/20 0000 New Bag/Given  [moved at this time since vanc level was not back from pharmacy]   vancomycin (VANCOCIN) IVPB 1000 mg/200 mL premix 1,000 mg 200 mL/hr   04/03/20 4970 New Bag/Given   metroNIDAZOLE (FLAGYL) IVPB 500 mg 500 mg 100 mL/hr   04/03/20 1039 New Bag/Given   ceFAZolin (ANCEF) IVPB 1 g/50 mL premix 1 g 100 mL/hr   04/03/20 1412 New Bag/Given   metroNIDAZOLE (FLAGYL) IVPB 500 mg 500 mg 100 mL/hr   04/03/20  1826 New Bag/Given   ceFAZolin (ANCEF) IVPB 1 g/50 mL premix 1 g 100 mL/hr   04/03/20 2114 New Bag/Given   metroNIDAZOLE (FLAGYL) IVPB 500 mg 500 mg 100 mL/hr   04/04/20 0531 New Bag/Given   ceFAZolin (ANCEF) IVPB 1 g/50 mL premix 1 g 100 mL/hr   04/04/20 0945 New Bag/Given   metroNIDAZOLE (FLAGYL) IVPB 500 mg 500 mg 100 mL/hr   04/04/20 1242 New Bag/Given   vancomycin (VANCOREADY) IVPB 1500 mg/300 mL 1,500 mg 150 mL/hr     . amLODipine  10 mg Oral Daily  . aspirin EC  81 mg Oral Daily  . atenolol  100 mg Oral BID  . calcitRIOL  0.25 mcg Oral Daily  . cloNIDine  0.2 mg Oral BID  . docusate sodium  100 mg Oral BID  . enoxaparin (LOVENOX) injection  40 mg Subcutaneous Q24H  . famotidine  20 mg Oral Daily  . ferrous sulfate  325 mg Oral BID WC  . FLUoxetine  20 mg Oral BID  . insulin aspart  0-5 Units Subcutaneous QHS  . insulin aspart  0-9 Units Subcutaneous TID WC  . insulin detemir  25 Units Subcutaneous Daily  . senna  1 tablet Oral BID  . simvastatin  20 mg Oral QPM  Objective: Vital signs in last 24 hours: Temp:  [97.6 F (36.4 C)-98.1 F (36.7 C)] 97.8 F (36.6 C) (09/01 1129) Pulse Rate:  [73-76] 74 (09/01 1129) Resp:  [16-20] 16 (09/01 1129) BP: (153-179)/(89-106) 153/97 (09/01 1129) SpO2:  [98 %-100 %] 100 % (09/01 1039) Constitutional: He is oriented to person, place, and time. He appears well-developed and well-nourished. No distress.  Mouth/Throat: Oropharynx is clear and moist. .  Cardiovascular: Normal rate, regular rhythm and normal heart sounds. Pulmonary/Chest: Effort normal and breath sounds normal. No respiratory distress. He has no wheezes.  Abdominal: Soft. Bowel sounds are normal. He exhibits no distension. There is no tenderness.  Lymphadenopathy: He has no cervical adenopathy.  Neurological: He is alert and oriented to person, place, and time.  Skin: L foot wrapped R leg with edema, wrapped post op Lab Results Recent Labs    04/03/20 0821  04/04/20 0505  WBC 12.4* 13.9*  HGB 8.8* 8.4*  HCT 26.9* 25.4*  NA 137 139  K 4.7 4.0  CL 109 110  CO2 21* 21*  BUN 30* 27*  CREATININE 1.92* 1.90*    Microbiology: Results for orders placed or performed during the hospital encounter of 03/29/20  Blood culture (routine x 2)     Status: None   Collection Time: 03/29/20  3:51 PM   Specimen: BLOOD  Result Value Ref Range Status   Specimen Description BLOOD BLOOD RIGHT HAND  Final   Special Requests   Final    BOTTLES DRAWN AEROBIC AND ANAEROBIC Blood Culture adequate volume   Culture   Final    NO GROWTH 5 DAYS Performed at [facility], [address] [place], [place] [address]    Report Status 04/03/2020 FINAL  Final  Blood culture (routine x 2)     Status: None   Collection Time: 03/29/20  3:51 PM   Specimen: BLOOD  Result Value Ref Range Status   Specimen Description BLOOD LEFT ANTECUBITAL  Final   Special Requests   Final    BOTTLES DRAWN AEROBIC AND ANAEROBIC Blood Culture adequate volume   Culture   Final    NO GROWTH 5 DAYS Performed at [facility], [address] [place], [place] [address]    Report Status 04/03/2020 FINAL  Final  SARS Coronavirus 2 by RT PCR (hospital order, performed in [facility] hospital lab) Nasopharyngeal Nasopharyngeal Swab     Status: None   Collection Time: 03/29/20  5:15 PM   Specimen: Nasopharyngeal Swab  Result Value Ref Range Status   SARS Coronavirus 2 NEGATIVE NEGATIVE Final    Comment: (NOTE) SARS-CoV-2 target nucleic acids are NOT DETECTED.  The SARS-CoV-2 RNA is generally detectable in upper and lower respiratory specimens during the acute phase of infection. The lowest concentration of SARS-CoV-2 viral copies this assay can detect is 250 copies / mL. A negative result does not preclude SARS-CoV-2 infection and should not be used as the sole basis for treatment or other patient management decisions.  A negative result may occur with improper specimen  collection / handling, submission of specimen other than nasopharyngeal swab, presence of viral mutation(s) within the areas targeted by this assay, and inadequate number of viral copies (<250 copies / mL). A negative result must be combined with clinical observations, patient history, and epidemiological information.  Fact Sheet for Patients:   StrictlyIdeas.no  Fact Sheet for Healthcare Providers: BankingDealers.co.za  This test is not yet approved or  cleared by the [place] FDA and  has been authorized for detection and/or diagnosis of SARS-CoV-2 by FDA under an Emergency Use Authorization (EUA).  This EUA will remain in effect (meaning this test can be used) for the duration of the COVID-19 declaration under Section 564(b)(1) of the Act, 21 U.S.C. section 360bbb-3(b)(1), unless the authorization is terminated or revoked sooner.  Performed at [facility], [address], [place], [place] [address]   Aerobic/Anaerobic Culture (surgical/deep wound)     Status: None   Collection Time: 03/30/20  1:19 PM   Specimen: PATH Other; Tissue  Result Value Ref Range Status   Specimen Description   Final    WOUND Performed at [facility], [address] [place], [place] [address]    Special Requests   Final    LEFT 5TH TOE JOINT INFECTION Performed at [facility], [place], [place] [address]    Gram Stain NO WBC SEEN RARE GRAM POSITIVE COCCI IN PAIRS   Final   Culture   Final    FEW ESCHERICHIA COLI NO ANAEROBES ISOLATED Performed at [facility], [place] [address] [place], [place] [address]    Report Status 04/04/2020 FINAL  Final   Organism ID, Bacteria ESCHERICHIA COLI  Final      Susceptibility   Escherichia coli - MIC*    AMPICILLIN >=32 RESISTANT Resistant     CEFAZOLIN <=4 SENSITIVE Sensitive     CEFEPIME <=0.12 SENSITIVE Sensitive     CEFTAZIDIME <=1 SENSITIVE  Sensitive     CEFTRIAXONE <=0.25 SENSITIVE Sensitive     CIPROFLOXACIN <=0.25 SENSITIVE Sensitive     GENTAMICIN >=16 RESISTANT Resistant     IMIPENEM <=0.25 SENSITIVE Sensitive     TRIMETH/SULFA >=320 RESISTANT Resistant     AMPICILLIN/SULBACTAM >=32 RESISTANT Resistant     PIP/TAZO <=4 SENSITIVE Sensitive     * FEW ESCHERICHIA COLI  MRSA PCR Screening     Status: None   Collection Time: 04/02/20  8:11 AM   Specimen: Nasopharyngeal  Result Value Ref Range Status   MRSA by PCR NEGATIVE NEGATIVE Final    Comment:        The GeneXpert MRSA Assay (FDA approved for NASAL specimens only), is one component of a comprehensive MRSA colonization surveillance program. It is not intended to diagnose MRSA infection nor to guide or monitor treatment for MRSA infections. Performed at [facility], [address] [place],  [address]     Studies/Results: [place] Foot Complete Left  Result Date: 04/03/2020 CLINICAL DATA:  Fifth ray amputation EXAM: LEFT FOOT - COMPLETE 3+ VIEW COMPARISON:  None. FINDINGS: Interval postsurgical changes from transmetatarsal amputation of the fifth ray at the level of the proximal diaphysis. Resection margin is smooth. Expected postoperative changes within the distal soft tissues at the amputation site. No acute fracture. No malalignment. No new areas of cortical irregularity. IMPRESSION: Interval postsurgical changes from transmetatarsal amputation of the fifth ray at the level of the proximal diaphysis. Electronically Signed   By: Davina Poke D.O.   On: 04/03/2020 16:23   DIAGNOSIS:  A. TOE, LEFT FIFTH RAY; AMPUTATION:  - ULCERATION, ACUTE SUPPURATIVE INFLAMMATION, AND GANGRENOUS NECROSIS.  - ACUTE OSTEOMYELITIS.  - ACUTE INFLAMMATION PRESENT AT BONE AND SOFT TISSUE MARGINS.  Assessment/Plan: FELESIA STAHLECKER is a 64 y.o. 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 Recent otpt cx MRSA. Current cx E coli but still  pending. S/p 5th MT ray amp 8/27. 8/30 - wbc down -  path pending 9/1 cx with E coli. Bone margins +.   Recommendations Vanco for known MRSA - monitor cr and if worsens change to daptomycin Cont cefazolin and cont flagyl pending final cultures and bone path. Best Otpt option might include vanco or dapto if insurance will cover plus daily ceftriaxone and oral flagyl for 4 weeks then oral abx.  Has had Hickman line for IV abx  CKD - follows with Dr Abigail Butts.   Thank you very much for the consult. Will follow with you.  Leonel Ramsay   04/04/2020, 2:03 PM

## 2020-04-04 NOTE — Progress Notes (Signed)
PROGRESS NOTE    [name]  UVO:[id number] DOB: 20-Feb-1956 DOA: 03/29/2020 PCP: [name], MD   Brief Narrative: [name] is a 64 y.o. female with medical history significant of metastatic breast cancer, diabetes mellitus, CKD stage III, CHF, presents in the emergency department with open draining left foot wound and associated fever.   Patient was admitted for sepsis secondary to infected left foot wound,  MRI consistent with osteomyelitis.  Podiatry consulted, Patient underwent fifth ray amputation,  tolerated well.  Patient is continued on IV antibiotics,   Podiatry is following , wound care needs to be arranged.  Infectious diseases consulted to to guide antibiotic regimen.  ID recommended continue vancomycin for known MRSA and Flagyl and cefepime.  Patient will need Hickman's catheter for long-term IV antibiotics given history of CKD stage III.  Vascular surgery consulted for Hickman catheter.  Assessment & Plan:   Principal Problem:   Sepsis ([place]) Active Problems:   Asthma   Benign essential HTN   Chronic kidney disease (CKD), stage V (HCC)   Chronic systolic CHF (congestive heart failure) (HCC)   Depression with anxiety   Type 2 diabetes mellitus with diabetic chronic kidney disease (HCC)   MI (mitral incompetence)   Carcinoma of upper-inner quadrant of left breast in female, estrogen receptor positive ([place])   Metastasis from malignant tumor of breast (HCC)   Anemia of chronic disease   Lactic acidosis   Sepsis secondary to infected Left foot wound / Lactic acidosis: S/p fifth ray amputation POA: temp 101.9  HR 92 RR 18 BP 134/75 lactic acid 2.1.  Leukocytosis 25.5  Source foot infection. Started sepsis order set. Lactic acidosis : She received IV fluids in the ED, Lactic acid normalized. --continue vanc/Ancef/Flagyl, per ID rec --Hickman cath today for continued IV abx  AKI on CKD stage III:  >>> Improving Baseline creatinine remains around 2.1.     --Hold further MIVF --encourage oral hydration  Anemia of chronic disease: No obvious bleeding, hemoglobin is stable.  Hypertension:  Continue home BP regimen  Diabetes mellitus Lantus 25u daily (reduced dose) SSI  Elevated LFTs:  Could be due to metastatic lesion. Improved.  Metastatic breast cancer; She follows up outpatient with oncologist,  Hem / Oncology consulted.  Outpatient follow-up  Depression with anxiety: Continue home meds   DVT prophylaxis: Lovenox SQ Code Status: Full code  Family Communication: son updated at bedside today Status is: inpatient Dispo:   The patient is from: home Anticipated d/c is to: home with [facility].  Pt refused SNF rehab. Anticipated d/c date is: tomorrow Patient currently is not medically stable to d/c due to: Need ID to finalize abx regimen at discharge.    Consultants:    Podiatry   Procedures:  Partial 5th ray amputation left foot Antimicrobials: Anti-infectives (From admission, onward)   Start     Dose/Rate Route Frequency Ordered Stop   04/04/20 1200  [MAR Hold]  vancomycin (VANCOREADY) IVPB 1500 mg/300 mL        (MAR Hold since Wed 04/04/2020 at 1413.Hold Reason: Transfer to a Procedural area.)   1,500 mg 150 mL/hr over 120 Minutes Intravenous Every 48 hours 04/03/20 1229     04/03/20 1800  [MAR Hold]  ceFAZolin (ANCEF) IVPB 1 g/50 mL premix        (MAR Hold since Wed 04/04/2020 at 1413.Hold Reason: Transfer to a Procedural area.)   1 g 100 mL/hr over 30 Minutes Intravenous Every 8 hours 04/03/20 1229  04/02/20 2000  ceFAZolin (ANCEF) IVPB 1 g/50 mL premix  Status:  Discontinued        1 g 100 mL/hr over 30 Minutes Intravenous Every 12 hours 04/02/20 1521 04/03/20 1229   04/02/20 1000  vancomycin (VANCOCIN) IVPB 1000 mg/200 mL premix  Status:  Discontinued        1,000 mg 200 mL/hr over 60 Minutes Intravenous Every 48 hours 03/31/20 1041 03/31/20 1042   03/31/20 1800  vancomycin (VANCOCIN) IVPB 1000 mg/200 mL  premix  Status:  Discontinued        1,000 mg 200 mL/hr over 60 Minutes Intravenous Every 48 hours 03/31/20 1042 04/03/20 1229   03/31/20 1000  vancomycin (VANCOREADY) IVPB 750 mg/150 mL  Status:  Discontinued        750 mg 150 mL/hr over 60 Minutes Intravenous Every 48 hours 03/30/20 1327 03/31/20 1041   03/30/20 2200  [MAR Hold]  metroNIDAZOLE (FLAGYL) IVPB 500 mg        (MAR Hold since Wed 04/04/2020 at 1413.Hold Reason: Transfer to a Procedural area.)   500 mg 100 mL/hr over 60 Minutes Intravenous Every 8 hours 03/30/20 1722     03/30/20 2000  ceFEPIme (MAXIPIME) 2 g in sodium chloride 0.9 % 100 mL IVPB  Status:  Discontinued        2 g 200 mL/hr over 30 Minutes Intravenous Every 24 hours 03/30/20 1723 04/02/20 1521   03/30/20 1800  vancomycin (VANCOCIN) IVPB 1000 mg/200 mL premix  Status:  Discontinued        1,000 mg 200 mL/hr over 60 Minutes Intravenous Every 24 hours 03/29/20 1735 03/30/20 1327   03/30/20 1400  ceFEPIme (MAXIPIME) 2 g in sodium chloride 0.9 % 100 mL IVPB  Status:  Discontinued        2 g 200 mL/hr over 30 Minutes Intravenous Every 24 hours 03/29/20 1735 03/30/20 1323   03/29/20 1700  vancomycin (VANCOCIN) IVPB 1000 mg/200 mL premix  Status:  Discontinued        1,000 mg 200 mL/hr over 60 Minutes Intravenous  Once 03/29/20 1658 03/29/20 1700   03/29/20 1630  vancomycin (VANCOREADY) IVPB 500 mg/100 mL        500 mg 100 mL/hr over 60 Minutes Intravenous  Once 03/29/20 1541 03/29/20 1834   03/29/20 1545  metroNIDAZOLE (FLAGYL) IVPB 500 mg  Status:  Discontinued        500 mg 100 mL/hr over 60 Minutes Intravenous Every 8 hours 03/29/20 1534 03/29/20 1714   03/29/20 1530  ceFEPIme (MAXIPIME) 2 g in sodium chloride 0.9 % 100 mL IVPB        2 g 200 mL/hr over 30 Minutes Intravenous  Once 03/29/20 1522 03/29/20 1631   03/29/20 1530  vancomycin (VANCOCIN) IVPB 1000 mg/200 mL premix        1,000 mg 200 mL/hr over 60 Minutes Intravenous  Once 03/29/20 1522 03/29/20 1834       Subjective: Pt received Hickman today.  Tolerated it well.  No complaints today.  Pt has decided to go back home, against her family's wish for her to go to SNF rehab.   Objective: Vitals:   04/04/20 1417 04/04/20 1604 04/04/20 1609 04/04/20 1615  BP: (!) 183/105 (!) 187/98 (!) 171/96 112/83  Pulse: 77     Resp: 20 (!) 21 (!) 23 13  Temp: 98.4 F (36.9 C)     TempSrc: Oral     SpO2: 97% 100% 100% 98%  Weight: 88.5 kg  Height: 5\' 2"  (1.575 m)       Intake/Output Summary (Last 24 hours) at 04/04/2020 1622 Last data filed at 04/04/2020 1528 Gross per 24 hour  Intake 746.78 ml  Output --  Net 746.78 ml   Filed Weights   03/29/20 1119 04/04/20 1417  Weight: 88 kg 88.5 kg    Examination:  Constitutional: NAD, AAOx3 HEENT: conjunctivae and lids normal, EOMI CV: RRR no M,R,G. Distal pulses +2.  No cyanosis.   RESP: CTA B/L, normal respiratory effort  GI: +BS, NTND Extremities: No effusions, edema in BLE MSK: Left foot wrapped SKIN: warm, dry and intact Neuro: II - XII grossly intact.  Sensation intact Psych: Normal mood and affect.      Data Reviewed: I have personally reviewed following labs and imaging studies  CBC: Recent Labs  Lab 03/29/20 1127 03/30/20 0453 03/31/20 0530 04/01/20 0705 04/02/20 0710 04/03/20 0821 04/04/20 0505  WBC 25.2*   < > 19.6* 16.4* 11.5* 12.4* 13.9*  NEUTROABS 23.2*  --   --   --   --   --   --   HGB 8.6*   < > 7.5* 7.4* 8.1* 8.8* 8.4*  HCT 26.1*   < > 21.9* 23.0* 24.5* 26.9* 25.4*  MCV 91.3   < > 89.0 92.4 91.1 91.5 91.0  PLT 359   < > 346 433* 450* 504* 483*   < > = values in this interval not displayed.   Basic Metabolic Panel: Recent Labs  Lab 03/31/20 0530 04/01/20 0705 04/02/20 0710 04/03/20 0821 04/04/20 0505  NA 135 137 140 137 139  K 5.1 4.4 4.6 4.7 4.0  CL 106 109 112* 109 110  CO2 24 21* 21* 21* 21*  GLUCOSE 347* 96 140* 217* 82  BUN 39* 38* 33* 30* 27*  CREATININE 2.43* 2.39* 2.10* 1.92* 1.90*   CALCIUM 8.0* 8.4* 8.3* 8.6* 8.7*  MG 2.1  --  2.2 2.1 2.1  PHOS 3.9  --  2.6 2.3* 2.9   GFR: Estimated Creatinine Clearance: 31.3 mL/min (A) (by C-G formula based on SCr of 1.9 mg/dL (H)). Liver Function Tests: Recent Labs  Lab 03/29/20 1127 03/30/20 0453  AST 78* 30  ALT 55* 38  ALKPHOS 94 77  BILITOT 0.8 1.0  PROT 7.7 6.9  ALBUMIN 3.0* 2.6*   No results for input(s): LIPASE, AMYLASE in the last 168 hours. No results for input(s): AMMONIA in the last 168 hours. Coagulation Profile: Recent Labs  Lab 03/30/20 0453  INR 1.3*   Cardiac Enzymes: No results for input(s): CKTOTAL, CKMB, CKMBINDEX, TROPONINI in the last 168 hours. BNP (last 3 results) No results for input(s): PROBNP in the last 8760 hours. HbA1C: Recent Labs    04/04/20 0505  HGBA1C 8.0*   CBG: Recent Labs  Lab 04/03/20 2140 04/04/20 0749 04/04/20 1132 04/04/20 1420 04/04/20 1538  GLUCAP 145* 91 109* 88 77   Lipid Profile: No results for input(s): CHOL, HDL, LDLCALC, TRIG, CHOLHDL, LDLDIRECT in the last 72 hours. Thyroid Function Tests: No results for input(s): TSH, T4TOTAL, FREET4, T3FREE, THYROIDAB in the last 72 hours. Anemia Panel: No results for input(s): VITAMINB12, FOLATE, FERRITIN, TIBC, IRON, RETICCTPCT in the last 72 hours. Sepsis Labs: Recent Labs  Lab 03/29/20 1127 03/29/20 1551 03/30/20 0453  PROCALCITON  --   --  6.02  LATICACIDVEN 2.1* 1.4  --     Recent Results (from the past 240 hour(s))  Aerobic Culture (superficial specimen)     Status: None  Collection Time: 03/26/20  3:10 PM   Specimen: Wound  Result Value Ref Range Status   Specimen Description   Final    WOUND Performed at [facility], [address] [place], [place] [address]    Special Requests   Final    LEFT FOOT Performed at [facility], [address] [place], [place] [address]    Gram Stain   Final    NO WBC SEEN MODERATE GRAM POSITIVE COCCI FEW GRAM NEGATIVE RODS     Culture   Final    FEW METHICILLIN RESISTANT STAPHYLOCOCCUS AUREUS MIXED WITHIN OTHER ORGANISMS Performed at [facility], [place] [address], [place], [place] [address]    Report Status 03/29/2020 FINAL  Final   Organism ID, Bacteria METHICILLIN RESISTANT STAPHYLOCOCCUS AUREUS  Final      Susceptibility   Methicillin resistant staphylococcus aureus - MIC*    CIPROFLOXACIN >=8 RESISTANT Resistant     ERYTHROMYCIN >=8 RESISTANT Resistant     GENTAMICIN <=0.5 SENSITIVE Sensitive     OXACILLIN >=4 RESISTANT Resistant     TETRACYCLINE <=1 SENSITIVE Sensitive     VANCOMYCIN 1 SENSITIVE Sensitive     TRIMETH/SULFA <=10 SENSITIVE Sensitive     CLINDAMYCIN 1 INTERMEDIATE Intermediate     RIFAMPIN 16 RESISTANT Resistant     Inducible Clindamycin NEGATIVE Sensitive     * FEW METHICILLIN RESISTANT STAPHYLOCOCCUS AUREUS  Blood culture (routine x 2)     Status: None   Collection Time: 03/29/20  3:51 PM   Specimen: BLOOD  Result Value Ref Range Status   Specimen Description BLOOD BLOOD RIGHT HAND  Final   Special Requests   Final    BOTTLES DRAWN AEROBIC AND ANAEROBIC Blood Culture adequate volume   Culture   Final    NO GROWTH 5 DAYS Performed at [facility], [address], [place], [place] [address]    Report Status 04/03/2020 FINAL  Final  Blood culture (routine x 2)     Status: None   Collection Time: 03/29/20  3:51 PM   Specimen: BLOOD  Result Value Ref Range Status   Specimen Description BLOOD LEFT ANTECUBITAL  Final   Special Requests   Final    BOTTLES DRAWN AEROBIC AND ANAEROBIC Blood Culture adequate volume   Culture   Final    NO GROWTH 5 DAYS Performed at [facility], [address], [place], [place] [address]    Report Status 04/03/2020 FINAL  Final  SARS Coronavirus 2 by RT PCR (hospital order, performed in [facility] hospital lab) Nasopharyngeal Nasopharyngeal Swab     Status: None   Collection Time: 03/29/20  5:15 PM   Specimen: Nasopharyngeal  Swab  Result Value Ref Range Status   SARS Coronavirus 2 NEGATIVE NEGATIVE Final    Comment: (NOTE) SARS-CoV-2 target nucleic acids are NOT DETECTED.  The SARS-CoV-2 RNA is generally detectable in upper and lower respiratory specimens during the acute phase of infection. The lowest concentration of SARS-CoV-2 viral copies this assay can detect is 250 copies / mL. A negative result does not preclude SARS-CoV-2 infection and should not be used as the sole basis for treatment or other patient management decisions.  A negative result may occur with improper specimen collection / handling, submission of specimen other than nasopharyngeal swab, presence of viral mutation(s) within the areas targeted by this assay, and inadequate number of viral copies (<250 copies / mL). A negative result must be combined with clinical observations, patient  history, and epidemiological information.  Fact Sheet for Patients:   StrictlyIdeas.no  Fact Sheet for Healthcare Providers: BankingDealers.co.za  This test is not yet approved or  cleared by the [place] FDA and has been authorized for detection and/or diagnosis of SARS-CoV-2 by FDA under an Emergency Use Authorization (EUA).  This EUA will remain in effect (meaning this test can be used) for the duration of the COVID-19 declaration under Section 564(b)(1) of the Act, 21 U.S.C. section 360bbb-3(b)(1), unless the authorization is terminated or revoked sooner.  Performed at [facility], [address] [place], [place] [address]   Aerobic/Anaerobic Culture (surgical/deep wound)     Status: None   Collection Time: 03/30/20  1:19 PM   Specimen: PATH Other; Tissue  Result Value Ref Range Status   Specimen Description   Final    WOUND Performed at [facility], [address] [place], [place] [address]    Special Requests   Final    LEFT 5TH TOE JOINT INFECTION Performed at  [facility], [place], [place] [address]    Gram Stain NO WBC SEEN RARE GRAM POSITIVE COCCI IN PAIRS   Final   Culture   Final    FEW ESCHERICHIA COLI NO ANAEROBES ISOLATED Performed at [facility], [place] [address] [place], [place] [address]    Report Status 04/04/2020 FINAL  Final   Organism ID, Bacteria ESCHERICHIA COLI  Final      Susceptibility   Escherichia coli - MIC*    AMPICILLIN >=32 RESISTANT Resistant     CEFAZOLIN <=4 SENSITIVE Sensitive     CEFEPIME <=0.12 SENSITIVE Sensitive     CEFTAZIDIME <=1 SENSITIVE Sensitive     CEFTRIAXONE <=0.25 SENSITIVE Sensitive     CIPROFLOXACIN <=0.25 SENSITIVE Sensitive     GENTAMICIN >=16 RESISTANT Resistant     IMIPENEM <=0.25 SENSITIVE Sensitive     TRIMETH/SULFA >=320 RESISTANT Resistant     AMPICILLIN/SULBACTAM >=32 RESISTANT Resistant     PIP/TAZO <=4 SENSITIVE Sensitive     * FEW ESCHERICHIA COLI  MRSA PCR Screening     Status: None   Collection Time: 04/02/20  8:11 AM   Specimen: Nasopharyngeal  Result Value Ref Range Status   MRSA by PCR NEGATIVE NEGATIVE Final    Comment:        The GeneXpert MRSA Assay (FDA approved for NASAL specimens only), is one component of a comprehensive MRSA colonization surveillance program. It is not intended to diagnose MRSA infection nor to guide or monitor treatment for MRSA infections. Performed at [facility], [address], [place], [place] [address]          Radiology Studies: DG Foot Complete Left  Result Date: 04/03/2020 CLINICAL DATA:  Fifth ray amputation EXAM: LEFT FOOT - COMPLETE 3+ VIEW COMPARISON:  None. FINDINGS: Interval postsurgical changes from transmetatarsal amputation of the fifth ray at the level of the proximal diaphysis. Resection margin is smooth. Expected postoperative changes within the distal soft tissues at the amputation site. No acute fracture. No malalignment. No new areas of cortical irregularity.  IMPRESSION: Interval postsurgical changes from transmetatarsal amputation of the fifth ray at the level of the proximal diaphysis. Electronically Signed   By: Davina Poke D.O.   On: 04/03/2020 16:23     Scheduled Meds: . [MAR Hold] amLODipine  10 mg Oral Daily  . [MAR Hold] aspirin EC  81 mg Oral Daily  . [MAR Hold] atenolol  100 mg  Oral BID  . [MAR Hold] calcitRIOL  0.25 mcg Oral Daily  . [MAR Hold] cloNIDine  0.2 mg Oral BID  . [MAR Hold] docusate sodium  100 mg Oral BID  . [MAR Hold] enoxaparin (LOVENOX) injection  40 mg Subcutaneous Q24H  . [MAR Hold] famotidine  20 mg Oral Daily  . fentaNYL      . [MAR Hold] ferrous sulfate  325 mg Oral BID WC  . [MAR Hold] FLUoxetine  20 mg Oral BID  . [MAR Hold] insulin aspart  0-5 Units Subcutaneous QHS  . [MAR Hold] insulin aspart  0-9 Units Subcutaneous TID WC  . [MAR Hold] insulin detemir  25 Units Subcutaneous Daily  . midazolam      . [MAR Hold] senna  1 tablet Oral BID  . [MAR Hold] simvastatin  20 mg Oral QPM   Continuous Infusions: . [MAR Hold]  ceFAZolin (ANCEF) IV 1 g (04/04/20 1557)  . [MAR Hold] metronidazole Stopped (04/04/20 1045)  . [MAR Hold] vancomycin 150 mL/hr at 04/04/20 1243     LOS: 6 days    Enzo Bi, MD [facility]   If 7PM-7AM, please contact night-coverage

## 2020-04-04 NOTE — Consult Note (Signed)
[facility] Vascular Consult Note  MRN : [id number]  DELENA CASEBEER is a 64 y.o. (1956-03-08) female who presents with chief complaint of  Chief Complaint  Patient presents with  . Shortness of Breath   History of Present Illness:  Jolean A Foxworth is a 64 y.o. female with medical history significant of metastatic breast cancer, diabetes mellitus, CKD stage III, CHF, presents in the emergency department with open draining left foot wound and associated fever.  Patient reports having this wound for a while,  she has recently established care with the wound clinic but it has recently gotten worse and started draining.   On 03/30/2020 the patient underwent partial fifth ray amputation of the left foot.  She tolerated the procedure well.  Infectious disease was consulted due to osteomyelitis and sepsis and placed the patient on vancomycin for known MRSA, cefazolin and Flagyl.  Patient will be discharged home on IV antibiotics and vascular surgery was consulted by Dr. Dwyane Dee for [place] placement.  Current Facility-Administered Medications  Medication Dose Route Frequency Provider Last Rate Last Admin  . [MAR Hold] acetaminophen (TYLENOL) tablet 650 mg  650 mg Oral Q6H PRN Samara Deist, DPM       Or  . Doug Sou Hold] acetaminophen (TYLENOL) suppository 650 mg  650 mg Rectal Q6H PRN Samara Deist, DPM      . [MAR Hold] albuterol (PROVENTIL) (2.5 MG/3ML) 0.083% nebulizer solution 2.5 mg  2.5 mg Inhalation Q6H PRN Samara Deist, DPM      . Doug Sou Hold] ALPRAZolam Duanne Moron) tablet 0.5 mg  0.5 mg Oral BID PRN Samara Deist, DPM      . [MAR Hold] amLODipine (NORVASC) tablet 10 mg  10 mg Oral Daily Samara Deist, DPM   10 mg at 04/04/20 0945  . [MAR Hold] aspirin EC tablet 81 mg  81 mg Oral Daily Samara Deist, DPM   81 mg at 04/03/20 0913  . [MAR Hold] atenolol (TENORMIN) tablet 100 mg  100 mg Oral BID Samara Deist, DPM   100 mg at 04/04/20 0945  . [MAR Hold] budesonide  (PULMICORT) nebulizer solution 0.5 mg  2 mL Inhalation BID PRN Samara Deist, DPM      . Doug Sou Hold] calcitRIOL (ROCALTROL) capsule 0.25 mcg  0.25 mcg Oral Daily Samara Deist, DPM   0.25 mcg at 04/04/20 0946  . ceFAZolin (ANCEF) 1-4 GM/50ML-% IVPB           . [MAR Hold] ceFAZolin (ANCEF) IVPB 1 g/50 mL premix  1 g Intravenous Q8H Berton Mount, RPH   Stopped at 04/04/20 0601  . [MAR Hold] cloNIDine (CATAPRES) tablet 0.2 mg  0.2 mg Oral BID Samara Deist, DPM   0.2 mg at 04/04/20 0946  . [MAR Hold] docusate sodium (COLACE) capsule 100 mg  100 mg Oral BID Samara Deist, DPM   100 mg at 04/03/20 2110  . [MAR Hold] enoxaparin (LOVENOX) injection 40 mg  40 mg Subcutaneous Q24H Rocky Morel, RPH   40 mg at 04/03/20 2110  . [MAR Hold] famotidine (PEPCID) tablet 20 mg  20 mg Oral Daily Shawna Clamp, MD   20 mg at 04/04/20 0945  . [MAR Hold] ferrous sulfate tablet 325 mg  325 mg Oral BID WC Samara Deist, DPM   325 mg at 04/03/20 1621  . [MAR Hold] FLUoxetine (PROZAC) capsule 20 mg  20 mg Oral BID Samara Deist, DPM   20 mg at 04/03/20 2110  . [MAR Hold] insulin aspart (novoLOG) injection  0-5 Units  0-5 Units Subcutaneous QHS Samara Deist, DPM   2 Units at 03/30/20 2139  . [MAR Hold] insulin aspart (novoLOG) injection 0-9 Units  0-9 Units Subcutaneous TID WC Samara Deist, DPM   2 Units at 04/03/20 1620  . [MAR Hold] insulin detemir (LEVEMIR) injection 25 Units  25 Units Subcutaneous Daily Shawna Clamp, MD   25 Units at 04/04/20 1035  . [MAR Hold] metroNIDAZOLE (FLAGYL) IVPB 500 mg  500 mg Intravenous Q8H Leonel Ramsay, MD   Stopped at 04/04/20 1045  . [MAR Hold] ondansetron (ZOFRAN) tablet 4 mg  4 mg Oral Q6H PRN Samara Deist, DPM       Or  . Doug Sou Hold] ondansetron [facility]) injection 4 mg  4 mg Intravenous Q6H PRN Samara Deist, DPM   4 mg at 04/02/20 0827  . [MAR Hold] oxyCODONE (Oxy IR/ROXICODONE) immediate release tablet 5 mg  5 mg Oral Q8H PRN Samara Deist, DPM   5 mg at  04/03/20 2110  . [MAR Hold] senna (SENOKOT) tablet 8.6 mg  1 tablet Oral BID Samara Deist, DPM   8.6 mg at 04/03/20 2110  . [MAR Hold] simvastatin (ZOCOR) tablet 20 mg  20 mg Oral QPM Samara Deist, DPM   20 mg at 04/03/20 1829  . [MAR Hold] vancomycin (VANCOREADY) IVPB 1500 mg/300 mL  1,500 mg Intravenous Q48H Berton Mount, RPH 150 mL/hr at 04/04/20 1243 Rate Verify at 04/04/20 1243   Facility-Administered Medications Ordered in Other Encounters  Medication Dose Route Frequency Provider Last Rate Last Admin  . sodium chloride flush (NS) 0.9 % injection 10 mL  10 mL Intravenous PRN Cammie Sickle, MD   10 mL at 01/30/16 1054   Past Medical History:  Diagnosis Date  . Anemia   . Anxiety   . Asthma   . Cancer ([place]) 03/10/2018   Per NM PET order. Carcinoma of upper-inner quadrant of left breast in female, estrogen receptor positive .  Marland Kitchen Cancer (HCC)    LUNG  . CHF (congestive heart failure) ([place]) 1997  . CKD (chronic kidney disease)   . Depression   . Diabetes mellitus, type 2 ([place])   . Family history of breast cancer   . Family history of colon cancer   . Family history of ovarian cancer   . Family history of pancreatic cancer   . Family history of prostate cancer   . Family history of stomach cancer   . GERD (gastroesophageal reflux disease)    history of an ulcer  . Hair loss   . History of left breast cancer 05/29/14  . History of partial hysterectomy 12/31/2016   Per patient.  Has not had a period in years.  Had a partial hysterectomy years ago.  Marland Kitchen Hypertension   . Mitral valve regurgitation   . Neuromuscular disorder (HCC)    neuropathies in hand  . Obesity   . Pancreatitis 1997  . Stroke [facility]) 2010   with mild left arm weakness   Past Surgical History:  Procedure Laterality Date  . AMPUTATION Left 03/30/2020   Procedure: AMPUTATION 5th RAY;  Surgeon: Samara Deist, DPM;  Location: [facility] ORS;  Service: Podiatry;  Laterality: Left;  . APPLICATION OF  WOUND VAC Left 03/30/2020   Procedure: APPLICATION OF WOUND VAC;  Surgeon: Samara Deist, DPM;  Location: [facility] ORS;  Service: Podiatry;  Laterality: Left;  . CATARACT EXTRACTION W/PHACO Right 02/24/2019   Procedure: CATARACT EXTRACTION PHACO AND INTRAOCULAR LENS PLACEMENT (IOC) RIGHT DIABETES;  Surgeon: Marchia Meiers, MD;  Location: [facility] ORS;  Service: Ophthalmology;  Laterality: Right;  [place] 01:13.0 CDE 7.96 Fluid Pack Lot # [id number]  . CATARACT EXTRACTION W/PHACO Left 03/24/2019   Procedure: CATARACT EXTRACTION PHACO AND INTRAOCULAR LENS PLACEMENT (IOC) - left diabetic;  Surgeon: Marchia Meiers, MD;  Location: [facility] ORS;  Service: Ophthalmology;  Laterality: Left;  [place]  01:36 CDE 13.93 Fluid pack lot # [id number]  . CESAREAN SECTION    . CHOLECYSTECTOMY    . EXCISION OF TONGUE LESION N/A 08/17/2018   Procedure: EXCISION OF TONGUE LESION WITH FROZEN SECTIONS;  Surgeon: Beverly Gust, MD;  Location: [facility] ORS;  Service: ENT;  Laterality: N/A;  . EYE SURGERY Right    cataract extraction  . IRRIGATION AND DEBRIDEMENT FOOT Left 03/30/2020   Procedure: IRRIGATION AND DEBRIDEMENT FOOT;  Surgeon: Samara Deist, DPM;  Location: [facility] ORS;  Service: Podiatry;  Laterality: Left;  . PARTIAL HYSTERECTOMY  12/31/2016   Per patient, she has not had a period in years since she had a partial hysterectomy.  Marland Kitchen PORTA CATH INSERTION    . TUBAL LIGATION     Social History Social History   Tobacco Use  . Smoking status: Former Smoker    Packs/day: 0.50    Years: 1.00    Pack years: 0.50    Types: Cigarettes  . Smokeless tobacco: Never Used  Vaping Use  . Vaping Use: Never used  Substance Use Topics  . Alcohol use: No    Alcohol/week: 0.0 standard drinks  . Drug use: No   Family History Family History  Problem Relation Age of Onset  . Ovarian cancer Mother 21  . Diabetes Mother   . Hypertension Mother   . COPD Father   . Hypertension Father   . Colon cancer Father 29  . Diabetes Sister   . Breast  cancer Sister 90       bilateral  . Diabetes Brother   . Leukemia Maternal Aunt   . Pancreatic cancer Paternal Aunt 65  . Pancreatic cancer Paternal Uncle   . Colon cancer Paternal Uncle   . Stomach cancer Maternal Grandfather 72  . Throat cancer Paternal Grandmother   . Breast cancer Maternal Aunt 80  . Colon cancer Maternal Aunt   . Bone cancer Maternal Aunt   . Breast cancer Paternal Aunt        dx >50  . Prostate cancer Paternal Uncle   . Pancreatic cancer Paternal Uncle   . Throat cancer Paternal Uncle   . Lung cancer Paternal Uncle   . Stomach cancer Paternal Uncle   . Brain cancer Paternal Aunt   . Cancer Cousin        liver, kidney  . Prostate cancer Cousin        meastatic  . Lung cancer Other   Denies family history of peripheral artery disease, venous disease or renal disease.  Allergies  Allergen Reactions  . Chlorhexidine   . Fish-Derived Products   . Sulfamethoxazole-Trimethoprim Other (See Comments)   REVIEW OF SYSTEMS (Negative unless checked)  Constitutional: [] Weight loss  [] Fever  [] Chills Cardiac: [] Chest pain   [] Chest pressure   [] Palpitations   [] Shortness of breath when laying flat   [] Shortness of breath at rest   [] Shortness of breath with exertion. Vascular:  [] Pain in legs with walking   [] Pain in legs at rest   [] Pain in legs when laying flat   [] Claudication   [] Pain in feet when walking  [] Pain in  feet at rest  [] Pain in feet when laying flat   [] History of DVT   [] Phlebitis   [] Swelling in legs   [] Varicose veins   [] Non-healing ulcers Pulmonary:   [] Uses home oxygen   [] Productive cough   [] Hemoptysis   [] Wheeze  [] COPD   [] Asthma Neurologic:  [] Dizziness  [] Blackouts   [] Seizures   [] History of stroke   [] History of TIA  [] Aphasia   [] Temporary blindness   [] Dysphagia   [] Weakness or numbness in arms   [] Weakness or numbness in legs Musculoskeletal:  [] Arthritis   [] Joint swelling   [] Joint pain   [] Low back pain Hematologic:  [] Easy bruising   [] Easy bleeding   [] Hypercoagulable state   [] Anemic  [] Hepatitis Gastrointestinal:  [] [name]   [] Vomiting blood  [] Gastroesophageal reflux/heartburn   [] Difficulty swallowing. Genitourinary:  [x] Chronic kidney disease   [] Difficult urination  [] Frequent urination  [] Burning with urination   [] Blood in urine Skin:  [] Rashes   [x] Ulcers   [x] Wounds Psychological:  [] History of anxiety   []  History of major depression.  Physical Examination  Vitals:   04/04/20 1038 04/04/20 1039 04/04/20 1129 04/04/20 1417  BP: (!) 160/95 (!) 160/95 (!) 153/97 (!) 183/105  Pulse: 75 75 74 77  Resp:   16 20  Temp:   97.8 F (36.6 C) 98.4 F (36.9 C)  TempSrc:   Oral Oral  SpO2: 100% 100%  97%  Weight:    88.5 kg  Height:    5\' 2"  (1.575 m)   Body mass index is 35.67 kg/m. Gen: WD/WN Head: [place]/AT, No temporalis wasting Ear/Nose/Throat: Hearing grossly intact, nares w/o erythema or drainage Eyes: Sclera non-icteric, conjunctiva clear Neck: Supple, no nuchal rigidity.  No JVD.  Pulmonary:  Good air movement, clear to auscultation bilaterally.  Cardiac: RRR, normal S1, S2, no Murmurs, rubs or gallops. Chest: Right Port-A-Cath intact.  Skin is clean and dry.  No signs of infection. Vascular: 2+ radial and ulnar pulses bilaterally Gastrointestinal: soft, non-tender/non-distended. No guarding/reflex.  Musculoskeletal: M/S 5/5 throughout.  Extremities without ischemic changes.  No deformity or atrophy.  Minimal edema in the lower extremities bilaterally Neurologic: Intact, no deficits noted Psychiatric: Appropriate Dermatologic: No rashes or ulcers noted.   Lymph : No Cervical, Axillary, or Inguinal lymphadenopathy.  CBC Lab Results  Component Value Date   WBC 13.9 (H) 04/04/2020   HGB 8.4 (L) 04/04/2020   HCT 25.4 (L) 04/04/2020   MCV 91.0 04/04/2020   PLT 483 (H) 04/04/2020   BMET    Component Value Date/Time   NA 139 04/04/2020 0505   NA 130 (L) 06/06/2014 1102   K 4.0 04/04/2020  0505   K 3.9 06/06/2014 1102   CL 110 04/04/2020 0505   CL 95 (L) 06/06/2014 1102   CO2 21 (L) 04/04/2020 0505   CO2 28 06/06/2014 1102   GLUCOSE 82 04/04/2020 0505   GLUCOSE 349 (H) 06/06/2014 1102   BUN 27 (H) 04/04/2020 0505   BUN 17 06/06/2014 1102   CREATININE 1.90 (H) 04/04/2020 0505   CREATININE 1.63 (H) 06/06/2014 1102   CALCIUM 8.7 (L) 04/04/2020 0505   CALCIUM 9.2 06/06/2014 1102   GFRNONAA 28 (L) 04/04/2020 0505   GFRNONAA 35 (L) 06/06/2014 1102   GFRAA 32 (L) 04/04/2020 0505   GFRAA 42 (L) 06/06/2014 1102   Estimated Creatinine Clearance: 31.3 mL/min (A) (by C-G formula based on SCr of 1.9 mg/dL (H)).  COAG Lab Results  Component Value Date   INR  1.3 (H) 03/30/2020   INR 1.0 03/01/2020   INR 1.0 06/06/2014   Radiology DG Chest 2 View  Result Date: 03/29/2020 CLINICAL DATA:  Shortness of breath and dizziness.  Breast carcinoma EXAM: CHEST - 2 VIEW COMPARISON:  Chest radiograph September 27, 2019; PET-CT March 28, 2020 FINDINGS: There are pleural effusions bilaterally with bibasilar atelectatic change. The masslike area in the lingula seen on recent PET study is appreciable by radiography measuring 1.4 x 1.3 cm. Lungs elsewhere are clear. Heart is upper normal in size with pulmonary vascularity normal. Port-A-Cath tip is in the superior vena cava. No adenopathy is evident by radiography. IMPRESSION: Stable nodular opacity in the lingula measuring 1.4 x 1.3 cm. Bilateral pleural effusions with bibasilar atelectasis. No new opacity evident compared to recent PET study. Heart upper normal in size.  Port-A-Cath tip in superior vena cava. Electronically Signed   By: Lowella Grip III M.D.   On: 03/29/2020 12:33   MR FOOT LEFT WO CONTRAST  Result Date: 03/30/2020 CLINICAL DATA:  Foot swelling, diabetes, possible osteomyelitis. Gas in the soft tissues at the base of the small toe. EXAM: MRI OF THE LEFT FOOT WITHOUT CONTRAST TECHNIQUE: Multiplanar, multisequence MR imaging of  the left forefoot was performed. No intravenous contrast was administered. COMPARISON:  Radiographs 03/29/2020 FINDINGS: Bones/Joint/Cartilage Abnormal edema, cortical destruction/demineralization, and reduced T1 signal in the distal metaphysis and head of the fifth metatarsal and in the proximal phalanx of the small toe (especially proximally) compatible with osteomyelitis. Septic joint is a distinct possibility although the lack of a substantial joint effusion may indicate that the joint is draining to the plantar skin surface. There is synovitis in the fifth MTP joint as well as gas along the margins of the joint capsule. No other regions of osteomyelitis identified. Ligaments Lisfranc ligament intact. Muscles and Tendons Diffuse edema within and along regional musculature which could be neurogenic or due to myositis. Soft tissues Lateral and plantar to the fifth MTP joint there is abnormal gas, suspected cutaneous ulceration, and extensive inflammation. Some tiny amount of metal artifact in this vicinity possibly from bandaging, there is no visible metal foreign body on recent radiographs. Cannot exclude draining sinus tract to the skin from the fifth MTP joint. Do not see a readily drainable abscess although there is extensive subcutaneous edema tracking in the foot, especially dorsally, and into the toes. Cellulitis is a distinct possibility. IMPRESSION: 1. Osteomyelitis of the distal metaphysis and head of the fifth metatarsal and in the proximal phalanx of the small toe. 2. Fifth MTP septic joint is a distinct possibility although the lack of a substantial joint effusion may indicate that the joint is draining to the plantar or lateral surfaces. Loculations of gas in the soft tissues along the margins of the fifth MTP joint. 3. Extensive subcutaneous edema tracking in the foot, especially dorsally, and into the toes. Cellulitis is a distinct possibility. 4. Diffuse edema within and along regional musculature  which could be neurogenic or due to myositis. Electronically Signed   By: Van Clines M.D.   On: 03/30/2020 08:50   NM PET Image Restag (PS) Skull Base To Thigh  Result Date: 03/28/2020 CLINICAL DATA:  Subsequent treatment strategy for breast cancer. EXAM: NUCLEAR MEDICINE PET SKULL BASE TO THIGH TECHNIQUE: 10.2 mCi F-18 FDG was injected intravenously. Full-ring PET imaging was performed from the skull base to thigh after the radiotracer. CT data was obtained and used for attenuation correction and anatomic localization. Fasting blood glucose: 47  mg/dl COMPARISON:  12/12/2019. FINDINGS: Mediastinal blood pool activity: SUV max 1.9 Liver activity: SUV max NA NECK: No abnormal hypermetabolism. Incidental CT findings: None. CHEST: New and pre-existing hypermetabolic pleural/subpleural nodules are seen bilaterally. Index 1.4 x 1.8 cm juxta mediastinal subpleural right upper lobe nodule has an SUV max of 7.1, compared to 3.2 previously. 10 mm subcarinal lymph node has an SUV max of 3.8. No hypermetabolic axillary adenopathy. Lateral left breast mass measures similar, 1.8 x 3.3 cm with an SUV max 4.4, compared to 3.4 previously. Incidental CT findings: Right IJ Port-A-Cath terminates in the SVC. Atherosclerotic calcification of the aorta. Heart is enlarged. No pericardial effusion. Moderate right pleural effusion and small left pleural effusion, increased, with associated pleural thickening. ABDOMEN/PELVIS: New hypermetabolism in both adrenal glands, with an index SUV mass on the right of 4.4. The CT appearance of the adrenal glands is unchanged from 12/12/2019, however. No abnormal hypermetabolism in the liver, spleen or pancreas. Left external iliac lymph nodes are mildly hypermetabolic. Index 7 mm left external iliac lymph node (3/217) has increased slightly in size from 4 mm on 12/12/2019 and has an SUV max of 2.8. Uptake associated with soft tissue thickening along the right paramidline ventral abdominal  wall may be postoperative in etiology. Incidental CT findings: Liver is unremarkable. Cholecystectomy. Adrenal glands are otherwise unremarkable. 1.4 cm low-attenuation lesion off the right kidney is likely a cyst although definitive characterization is limited postcontrast imaging. Kidneys, spleen, pancreas, stomach and bowel are otherwise grossly unremarkable. SKELETON: Treated sclerotic lesions are seen. For example, an index lesion in the right acetabulum does not show abnormal hypermetabolism. There are new foci of hypermetabolism within the spine. Index 5 mm lytic lesion in the T10 vertebral body (3/117) has an SUV max of 4.8. Incidental CT findings: Degenerative changes in the spine. IMPRESSION: 1. Progressive pleuroparenchymal and osseous metastatic disease. 2. New hypermetabolic lymph nodes in the subcarinal station and left external iliac chain. 3. Similar hypermetabolic primary left breast mass. 4. New hypermetabolism within the adrenal glands, without definite CT correlate. Continued attention on follow-up exams is warranted. 5. Moderate right and small left pleural effusions, increased, with associated pleural thickening. 6.  Aortic atherosclerosis (ICD10-I70.0). Electronically Signed   By: Lorin Picket M.D.   On: 03/28/2020 10:51   DG Foot Complete Left  Result Date: 04/03/2020 CLINICAL DATA:  Fifth ray amputation EXAM: LEFT FOOT - COMPLETE 3+ VIEW COMPARISON:  None. FINDINGS: Interval postsurgical changes from transmetatarsal amputation of the fifth ray at the level of the proximal diaphysis. Resection margin is smooth. Expected postoperative changes within the distal soft tissues at the amputation site. No acute fracture. No malalignment. No new areas of cortical irregularity. IMPRESSION: Interval postsurgical changes from transmetatarsal amputation of the fifth ray at the level of the proximal diaphysis. Electronically Signed   By: Davina Poke D.O.   On: 04/03/2020 16:23   DG Foot  Complete Left  Result Date: 03/29/2020 CLINICAL DATA:  Left foot wound, initial encounter EXAM: LEFT FOOT - COMPLETE 3+ VIEW COMPARISON:  None. FINDINGS: Considerable soft tissue swelling is noted in the distal aspect of the foot. Some subcutaneous air is noted about the fifth MTP joint. These changes correspond to the given clinical history of localized infection. No definitive fracture or bony erosive changes are noted. No other focal abnormality is seen. IMPRESSION: Soft tissue swelling and subcutaneous air consistent with localized infection predominately about the fifth MTP joint. No definitive erosive changes are noted. If clinically indicated MRI  may be helpful for further evaluation. Electronically Signed   By: Inez Catalina M.D.   On: 03/29/2020 15:56   DG MINI C-ARM IMAGE ONLY  Result Date: 03/30/2020 There is no interpretation for this exam.  This order is for images obtained during a surgical procedure.  Please See "Surgeries" Tab for more information regarding the procedure.   ECHOCARDIOGRAM COMPLETE  Result Date: 03/30/2020    ECHOCARDIOGRAM REPORT   Patient Name:   LILLAH STANDRE Date of Exam: 03/30/2020 Medical Rec #:  [id number]      Height:       62.0 in Accession #:    [id number]     Weight:       194.0 lb Date of Birth:  03/29/1956       BSA:          1.887 m Patient Age:    34 years       BP:           129/76 mmHg Patient Gender: F              HR:           88 bpm. Exam Location:  [facility] Procedure: 2D Echo, Cardiac Doppler and Color Doppler Indications:     Fever 780.6  History:         Patient has no prior history of Echocardiogram examinations.                  CHF, Stroke, Arrythmias:PAC; Risk Factors:Hypertension.  Sonographer:     Sherrie Sport RDCS (AE) Referring Phys:  [place] Diagnosing Phys: Serafina Royals MD IMPRESSIONS  1. Left ventricular ejection fraction, by estimation, is 55 to 60%. The left ventricle has normal function. The left ventricle has no regional wall  motion abnormalities. Left ventricular diastolic parameters were normal.  2. Right ventricular systolic function is normal. The right ventricular size is normal. There is normal pulmonary artery systolic pressure.  3. Left atrial size was mildly dilated.  4. The mitral valve is normal in structure. Mild to moderate mitral valve regurgitation.  5. The aortic valve is normal in structure. Aortic valve regurgitation is not visualized. FINDINGS  Left Ventricle: Left ventricular ejection fraction, by estimation, is 55 to 60%. The left ventricle has normal function. The left ventricle has no regional wall motion abnormalities. The left ventricular internal cavity size was normal in size. There is  no left ventricular hypertrophy. Left ventricular diastolic parameters were normal. Right Ventricle: The right ventricular size is normal. No increase in right ventricular wall thickness. Right ventricular systolic function is normal. There is normal pulmonary artery systolic pressure. The tricuspid regurgitant velocity is 2.42 m/s, and  with an assumed right atrial pressure of 10 mmHg, the estimated right ventricular systolic pressure is 60.1 mmHg. Left Atrium: Left atrial size was mildly dilated. Right Atrium: Right atrial size was normal in size. Pericardium: There is no evidence of pericardial effusion. Mitral Valve: The mitral valve is normal in structure. Mild to moderate mitral valve regurgitation. Tricuspid Valve: The tricuspid valve is normal in structure. Tricuspid valve regurgitation is trivial. Aortic Valve: The aortic valve is normal in structure. Aortic valve regurgitation is not visualized. Aortic valve mean gradient measures 6.0 mmHg. Aortic valve peak gradient measures 10.4 mmHg. Aortic valve area, by VTI measures 1.74 cm. Pulmonic Valve: The pulmonic valve was normal in structure. Pulmonic valve regurgitation is not visualized. Aorta: The aortic root and ascending aorta are structurally normal, with  no  evidence of dilitation. IAS/Shunts: No atrial level shunt detected by color flow Doppler.  LEFT VENTRICLE PLAX 2D LVIDd:         4.30 cm  Diastology LVIDs:         2.79 cm  LV e' lateral:   7.51 cm/s LV PW:         1.43 cm  LV E/e' lateral: 14.1 LV IVS:        1.17 cm  LV e' medial:    6.31 cm/s LVOT diam:     2.00 cm  LV E/e' medial:  16.8 LV SV:         58 LV SV Index:   31 LVOT Area:     3.14 cm  RIGHT VENTRICLE RV Basal diam:  2.73 cm RV S prime:     15.20 cm/s TAPSE (M-mode): 3.9 cm LEFT ATRIUM             Index       RIGHT ATRIUM           Index LA diam:        4.30 cm 2.28 cm/m  RA Area:     16.00 cm LA Vol (A2C):   65.2 ml 34.55 ml/m RA Volume:   41.00 ml  21.73 ml/m LA Vol (A4C):   42.3 ml 22.42 ml/m LA Biplane Vol: 55.4 ml 29.36 ml/m  AORTIC VALVE                    PULMONIC VALVE AV Area (Vmax):    1.67 cm     PV Vmax:        0.71 m/s AV Area (Vmean):   1.59 cm     PV Peak grad:   2.0 mmHg AV Area (VTI):     1.74 cm     RVOT Peak grad: 3 mmHg AV Vmax:           161.00 cm/s AV Vmean:          114.333 cm/s AV VTI:            0.337 m AV Peak Grad:      10.4 mmHg AV Mean Grad:      6.0 mmHg LVOT Vmax:         85.70 cm/s LVOT Vmean:        57.800 cm/s LVOT VTI:          0.186 m LVOT/AV VTI ratio: 0.55  AORTA Ao Root diam: 2.70 cm MITRAL VALVE                TRICUSPID VALVE MV Area (PHT): 5.66 cm     TR Peak grad:   23.4 mmHg MV Decel Time: 134 msec     TR Vmax:        242.00 cm/s MV E velocity: 106.00 cm/s MV A velocity: 150.00 cm/s  SHUNTS MV E/A ratio:  0.71         Systemic VTI:  0.19 m                             Systemic Diam: 2.00 cm Serafina Royals MD Electronically signed by Serafina Royals MD Signature Date/Time: 03/30/2020/1:20:11 PM    Final    Assessment/Plan The patient is a 63 year old female with multiple medical issues including 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  Osteomyelitis /  Sepsis: Patient is status post amputation by podiatry however was also found to have osteomyelitis and sepsis requiring prolonged IV antibiotics.  We will place a Hickman catheter to allow the patient to continue to receive IV antibiotics.  Procedure, risks and benefits were explained to the patient.  All questions were answered.  The patient wished to proceed.  2.  Diabetes: On appropriate medications. Encouraged good control as its slows the progression of atherosclerotic disease  3.  Chronic Kidney Disease: Due to the patient's chronic kidney disease status she is unable to have a PICC placed for long-term antibiotics. This is followed by the patient's nephrologist  Discussed with Dr. Francene Castle, PA-C  04/04/2020 3:50 PM

## 2020-04-04 NOTE — H&P (View-Only) (Signed)
[facility] Vascular Consult Note  MRN : [id number]  [name] is a 64 y.o. (1956/03/17) female who presents with chief complaint of  Chief Complaint  Patient presents with  . Shortness of Breath   History of Present Illness:  [name] is a 64 y.o. female with medical history significant of metastatic breast cancer, diabetes mellitus, CKD stage III, CHF, presents in the emergency department with open draining left foot wound and associated fever.  Patient reports having this wound for a while,  she has recently established care with the wound clinic but it has recently gotten worse and started draining.   On 03/30/2020 the patient underwent partial fifth ray amputation of the left foot.  She tolerated the procedure well.  Infectious disease was consulted due to osteomyelitis and sepsis and placed the patient on vancomycin for known MRSA, cefazolin and Flagyl.  Patient will be discharged home on IV antibiotics and vascular surgery was consulted by Dr. Dwyane Dee for [place] placement.  Current Facility-Administered Medications  Medication Dose Route Frequency Provider Last Rate Last Admin  . [MAR Hold] acetaminophen (TYLENOL) tablet 650 mg  650 mg Oral Q6H PRN Samara Deist, DPM       Or  . Doug Sou Hold] acetaminophen (TYLENOL) suppository 650 mg  650 mg Rectal Q6H PRN Samara Deist, DPM      . [MAR Hold] albuterol (PROVENTIL) (2.5 MG/3ML) 0.083% nebulizer solution 2.5 mg  2.5 mg Inhalation Q6H PRN Samara Deist, DPM      . Doug Sou Hold] ALPRAZolam Duanne Moron) tablet 0.5 mg  0.5 mg Oral BID PRN Samara Deist, DPM      . [MAR Hold] amLODipine (NORVASC) tablet 10 mg  10 mg Oral Daily Samara Deist, DPM   10 mg at 04/04/20 0945  . [MAR Hold] aspirin EC tablet 81 mg  81 mg Oral Daily Samara Deist, DPM   81 mg at 04/03/20 0913  . [MAR Hold] atenolol (TENORMIN) tablet 100 mg  100 mg Oral BID Samara Deist, DPM   100 mg at 04/04/20 0945  . [MAR Hold] budesonide  (PULMICORT) nebulizer solution 0.5 mg  2 mL Inhalation BID PRN Samara Deist, DPM      . Doug Sou Hold] calcitRIOL (ROCALTROL) capsule 0.25 mcg  0.25 mcg Oral Daily Samara Deist, DPM   0.25 mcg at 04/04/20 0946  . ceFAZolin (ANCEF) 1-4 GM/50ML-% IVPB           . [MAR Hold] ceFAZolin (ANCEF) IVPB 1 g/50 mL premix  1 g Intravenous Q8H Berton Mount, RPH   Stopped at 04/04/20 0601  . [MAR Hold] cloNIDine (CATAPRES) tablet 0.2 mg  0.2 mg Oral BID Samara Deist, DPM   0.2 mg at 04/04/20 0946  . [MAR Hold] docusate sodium (COLACE) capsule 100 mg  100 mg Oral BID Samara Deist, DPM   100 mg at 04/03/20 2110  . [MAR Hold] enoxaparin (LOVENOX) injection 40 mg  40 mg Subcutaneous Q24H Rocky Morel, RPH   40 mg at 04/03/20 2110  . [MAR Hold] famotidine (PEPCID) tablet 20 mg  20 mg Oral Daily Shawna Clamp, MD   20 mg at 04/04/20 0945  . [MAR Hold] ferrous sulfate tablet 325 mg  325 mg Oral BID WC Samara Deist, DPM   325 mg at 04/03/20 1621  . [MAR Hold] FLUoxetine (PROZAC) capsule 20 mg  20 mg Oral BID Samara Deist, DPM   20 mg at 04/03/20 2110  . [MAR Hold] insulin aspart (novoLOG) injection  0-5 Units  0-5 Units Subcutaneous QHS Samara Deist, DPM   2 Units at 03/30/20 2139  . [MAR Hold] insulin aspart (novoLOG) injection 0-9 Units  0-9 Units Subcutaneous TID WC Samara Deist, DPM   2 Units at 04/03/20 1620  . [MAR Hold] insulin detemir (LEVEMIR) injection 25 Units  25 Units Subcutaneous Daily Shawna Clamp, MD   25 Units at 04/04/20 1035  . [MAR Hold] metroNIDAZOLE (FLAGYL) IVPB 500 mg  500 mg Intravenous Q8H Leonel Ramsay, MD   Stopped at 04/04/20 1045  . [MAR Hold] ondansetron (ZOFRAN) tablet 4 mg  4 mg Oral Q6H PRN Samara Deist, DPM       Or  . Doug Sou Hold] ondansetron [facility]) injection 4 mg  4 mg Intravenous Q6H PRN Samara Deist, DPM   4 mg at 04/02/20 0827  . [MAR Hold] oxyCODONE (Oxy IR/ROXICODONE) immediate release tablet 5 mg  5 mg Oral Q8H PRN Samara Deist, DPM   5 mg at  04/03/20 2110  . [MAR Hold] senna (SENOKOT) tablet 8.6 mg  1 tablet Oral BID Samara Deist, DPM   8.6 mg at 04/03/20 2110  . [MAR Hold] simvastatin (ZOCOR) tablet 20 mg  20 mg Oral QPM Samara Deist, DPM   20 mg at 04/03/20 1829  . [MAR Hold] vancomycin (VANCOREADY) IVPB 1500 mg/300 mL  1,500 mg Intravenous Q48H Berton Mount, RPH 150 mL/hr at 04/04/20 1243 Rate Verify at 04/04/20 1243   Facility-Administered Medications Ordered in Other Encounters  Medication Dose Route Frequency Provider Last Rate Last Admin  . sodium chloride flush (NS) 0.9 % injection 10 mL  10 mL Intravenous PRN Cammie Sickle, MD   10 mL at 01/30/16 1054   Past Medical History:  Diagnosis Date  . Anemia   . Anxiety   . Asthma   . Cancer ([place]) 03/10/2018   Per NM PET order. Carcinoma of upper-inner quadrant of left breast in female, estrogen receptor positive .  Marland Kitchen Cancer (HCC)    LUNG  . CHF (congestive heart failure) ([place]) 1997  . CKD (chronic kidney disease)   . Depression   . Diabetes mellitus, type 2 ([place])   . Family history of breast cancer   . Family history of colon cancer   . Family history of ovarian cancer   . Family history of pancreatic cancer   . Family history of prostate cancer   . Family history of stomach cancer   . GERD (gastroesophageal reflux disease)    history of an ulcer  . Hair loss   . History of left breast cancer 05/29/14  . History of partial hysterectomy 12/31/2016   Per patient.  Has not had a period in years.  Had a partial hysterectomy years ago.  Marland Kitchen Hypertension   . Mitral valve regurgitation   . Neuromuscular disorder (HCC)    neuropathies in hand  . Obesity   . Pancreatitis 1997  . Stroke [facility]) 2010   with mild left arm weakness   Past Surgical History:  Procedure Laterality Date  . AMPUTATION Left 03/30/2020   Procedure: AMPUTATION 5th RAY;  Surgeon: Samara Deist, DPM;  Location: [facility] ORS;  Service: Podiatry;  Laterality: Left;  . APPLICATION OF  WOUND VAC Left 03/30/2020   Procedure: APPLICATION OF WOUND VAC;  Surgeon: Samara Deist, DPM;  Location: [facility] ORS;  Service: Podiatry;  Laterality: Left;  . CATARACT EXTRACTION W/PHACO Right 02/24/2019   Procedure: CATARACT EXTRACTION PHACO AND INTRAOCULAR LENS PLACEMENT (IOC) RIGHT DIABETES;  Surgeon: Marchia Meiers, MD;  Location: [facility] ORS;  Service: Ophthalmology;  Laterality: Right;  [place] 01:13.0 CDE 7.96 Fluid Pack Lot # [id number]  . CATARACT EXTRACTION W/PHACO Left 03/24/2019   Procedure: CATARACT EXTRACTION PHACO AND INTRAOCULAR LENS PLACEMENT (IOC) - left diabetic;  Surgeon: Marchia Meiers, MD;  Location: [facility] ORS;  Service: Ophthalmology;  Laterality: Left;  [place]  01:36 CDE 13.93 Fluid pack lot # [id number]  . CESAREAN SECTION    . CHOLECYSTECTOMY    . EXCISION OF TONGUE LESION N/A 08/17/2018   Procedure: EXCISION OF TONGUE LESION WITH FROZEN SECTIONS;  Surgeon: Beverly Gust, MD;  Location: [facility] ORS;  Service: ENT;  Laterality: N/A;  . EYE SURGERY Right    cataract extraction  . IRRIGATION AND DEBRIDEMENT FOOT Left 03/30/2020   Procedure: IRRIGATION AND DEBRIDEMENT FOOT;  Surgeon: Samara Deist, DPM;  Location: [facility] ORS;  Service: Podiatry;  Laterality: Left;  . PARTIAL HYSTERECTOMY  12/31/2016   Per patient, she has not had a period in years since she had a partial hysterectomy.  Marland Kitchen PORTA CATH INSERTION    . TUBAL LIGATION     Social History Social History   Tobacco Use  . Smoking status: Former Smoker    Packs/day: 0.50    Years: 1.00    Pack years: 0.50    Types: Cigarettes  . Smokeless tobacco: Never Used  Vaping Use  . Vaping Use: Never used  Substance Use Topics  . Alcohol use: No    Alcohol/week: 0.0 standard drinks  . Drug use: No   Family History Family History  Problem Relation Age of Onset  . Ovarian cancer Mother 43  . Diabetes Mother   . Hypertension Mother   . COPD Father   . Hypertension Father   . Colon cancer Father 62  . Diabetes Sister   . Breast  cancer Sister 60       bilateral  . Diabetes Brother   . Leukemia Maternal Aunt   . Pancreatic cancer Paternal Aunt 68  . Pancreatic cancer Paternal Uncle   . Colon cancer Paternal Uncle   . Stomach cancer Maternal Grandfather 70  . Throat cancer Paternal Grandmother   . Breast cancer Maternal Aunt 80  . Colon cancer Maternal Aunt   . Bone cancer Maternal Aunt   . Breast cancer Paternal Aunt        dx >50  . Prostate cancer Paternal Uncle   . Pancreatic cancer Paternal Uncle   . Throat cancer Paternal Uncle   . Lung cancer Paternal Uncle   . Stomach cancer Paternal Uncle   . Brain cancer Paternal Aunt   . Cancer Cousin        liver, kidney  . Prostate cancer Cousin        meastatic  . Lung cancer Other   Denies family history of peripheral artery disease, venous disease or renal disease.  Allergies  Allergen Reactions  . Chlorhexidine   . Fish-Derived Products   . Sulfamethoxazole-Trimethoprim Other (See Comments)   REVIEW OF SYSTEMS (Negative unless checked)  Constitutional: [] Weight loss  [] Fever  [] Chills Cardiac: [] Chest pain   [] Chest pressure   [] Palpitations   [] Shortness of breath when laying flat   [] Shortness of breath at rest   [] Shortness of breath with exertion. Vascular:  [] Pain in legs with walking   [] Pain in legs at rest   [] Pain in legs when laying flat   [] Claudication   [] Pain in feet when walking  [] Pain in  feet at rest  [] Pain in feet when laying flat   [] History of DVT   [] Phlebitis   [] Swelling in legs   [] Varicose veins   [] Non-healing ulcers Pulmonary:   [] Uses home oxygen   [] Productive cough   [] Hemoptysis   [] Wheeze  [] COPD   [] Asthma Neurologic:  [] Dizziness  [] Blackouts   [] Seizures   [] History of stroke   [] History of TIA  [] Aphasia   [] Temporary blindness   [] Dysphagia   [] Weakness or numbness in arms   [] Weakness or numbness in legs Musculoskeletal:  [] Arthritis   [] Joint swelling   [] Joint pain   [] Low back pain Hematologic:  [] Easy bruising   [] Easy bleeding   [] Hypercoagulable state   [] Anemic  [] Hepatitis Gastrointestinal:  [] [name]   [] Vomiting blood  [] Gastroesophageal reflux/heartburn   [] Difficulty swallowing. Genitourinary:  [x] Chronic kidney disease   [] Difficult urination  [] Frequent urination  [] Burning with urination   [] Blood in urine Skin:  [] Rashes   [x] Ulcers   [x] Wounds Psychological:  [] History of anxiety   []  History of major depression.  Physical Examination  Vitals:   04/04/20 1038 04/04/20 1039 04/04/20 1129 04/04/20 1417  BP: (!) 160/95 (!) 160/95 (!) 153/97 (!) 183/105  Pulse: 75 75 74 77  Resp:   16 20  Temp:   97.8 F (36.6 C) 98.4 F (36.9 C)  TempSrc:   Oral Oral  SpO2: 100% 100%  97%  Weight:    88.5 kg  Height:    5\' 2"  (1.575 m)   Body mass index is 35.67 kg/m. Gen: WD/WN Head: [place]/AT, No temporalis wasting Ear/Nose/Throat: Hearing grossly intact, nares w/o erythema or drainage Eyes: Sclera non-icteric, conjunctiva clear Neck: Supple, no nuchal rigidity.  No JVD.  Pulmonary:  Good air movement, clear to auscultation bilaterally.  Cardiac: RRR, normal S1, S2, no Murmurs, rubs or gallops. Chest: Right Port-A-Cath intact.  Skin is clean and dry.  No signs of infection. Vascular: 2+ radial and ulnar pulses bilaterally Gastrointestinal: soft, non-tender/non-distended. No guarding/reflex.  Musculoskeletal: M/S 5/5 throughout.  Extremities without ischemic changes.  No deformity or atrophy.  Minimal edema in the lower extremities bilaterally Neurologic: Intact, no deficits noted Psychiatric: Appropriate Dermatologic: No rashes or ulcers noted.   Lymph : No Cervical, Axillary, or Inguinal lymphadenopathy.  CBC Lab Results  Component Value Date   WBC 13.9 (H) 04/04/2020   HGB 8.4 (L) 04/04/2020   HCT 25.4 (L) 04/04/2020   MCV 91.0 04/04/2020   PLT 483 (H) 04/04/2020   BMET    Component Value Date/Time   NA 139 04/04/2020 0505   NA 130 (L) 06/06/2014 1102   K 4.0 04/04/2020  0505   K 3.9 06/06/2014 1102   CL 110 04/04/2020 0505   CL 95 (L) 06/06/2014 1102   CO2 21 (L) 04/04/2020 0505   CO2 28 06/06/2014 1102   GLUCOSE 82 04/04/2020 0505   GLUCOSE 349 (H) 06/06/2014 1102   BUN 27 (H) 04/04/2020 0505   BUN 17 06/06/2014 1102   CREATININE 1.90 (H) 04/04/2020 0505   CREATININE 1.63 (H) 06/06/2014 1102   CALCIUM 8.7 (L) 04/04/2020 0505   CALCIUM 9.2 06/06/2014 1102   GFRNONAA 28 (L) 04/04/2020 0505   GFRNONAA 35 (L) 06/06/2014 1102   GFRAA 32 (L) 04/04/2020 0505   GFRAA 42 (L) 06/06/2014 1102   Estimated Creatinine Clearance: 31.3 mL/min (A) (by C-G formula based on SCr of 1.9 mg/dL (H)).  COAG Lab Results  Component Value Date   INR  1.3 (H) 03/30/2020   INR 1.0 03/01/2020   INR 1.0 06/06/2014   Radiology DG Chest 2 View  Result Date: 03/29/2020 CLINICAL DATA:  Shortness of breath and dizziness.  Breast carcinoma EXAM: CHEST - 2 VIEW COMPARISON:  Chest radiograph September 27, 2019; PET-CT March 28, 2020 FINDINGS: There are pleural effusions bilaterally with bibasilar atelectatic change. The masslike area in the lingula seen on recent PET study is appreciable by radiography measuring 1.4 x 1.3 cm. Lungs elsewhere are clear. Heart is upper normal in size with pulmonary vascularity normal. Port-A-Cath tip is in the superior vena cava. No adenopathy is evident by radiography. IMPRESSION: Stable nodular opacity in the lingula measuring 1.4 x 1.3 cm. Bilateral pleural effusions with bibasilar atelectasis. No new opacity evident compared to recent PET study. Heart upper normal in size.  Port-A-Cath tip in superior vena cava. Electronically Signed   By: Lowella Grip III M.D.   On: 03/29/2020 12:33   MR FOOT LEFT WO CONTRAST  Result Date: 03/30/2020 CLINICAL DATA:  Foot swelling, diabetes, possible osteomyelitis. Gas in the soft tissues at the base of the small toe. EXAM: MRI OF THE LEFT FOOT WITHOUT CONTRAST TECHNIQUE: Multiplanar, multisequence MR imaging of  the left forefoot was performed. No intravenous contrast was administered. COMPARISON:  Radiographs 03/29/2020 FINDINGS: Bones/Joint/Cartilage Abnormal edema, cortical destruction/demineralization, and reduced T1 signal in the distal metaphysis and head of the fifth metatarsal and in the proximal phalanx of the small toe (especially proximally) compatible with osteomyelitis. Septic joint is a distinct possibility although the lack of a substantial joint effusion may indicate that the joint is draining to the plantar skin surface. There is synovitis in the fifth MTP joint as well as gas along the margins of the joint capsule. No other regions of osteomyelitis identified. Ligaments Lisfranc ligament intact. Muscles and Tendons Diffuse edema within and along regional musculature which could be neurogenic or due to myositis. Soft tissues Lateral and plantar to the fifth MTP joint there is abnormal gas, suspected cutaneous ulceration, and extensive inflammation. Some tiny amount of metal artifact in this vicinity possibly from bandaging, there is no visible metal foreign body on recent radiographs. Cannot exclude draining sinus tract to the skin from the fifth MTP joint. Do not see a readily drainable abscess although there is extensive subcutaneous edema tracking in the foot, especially dorsally, and into the toes. Cellulitis is a distinct possibility. IMPRESSION: 1. Osteomyelitis of the distal metaphysis and head of the fifth metatarsal and in the proximal phalanx of the small toe. 2. Fifth MTP septic joint is a distinct possibility although the lack of a substantial joint effusion may indicate that the joint is draining to the plantar or lateral surfaces. Loculations of gas in the soft tissues along the margins of the fifth MTP joint. 3. Extensive subcutaneous edema tracking in the foot, especially dorsally, and into the toes. Cellulitis is a distinct possibility. 4. Diffuse edema within and along regional musculature  which could be neurogenic or due to myositis. Electronically Signed   By: Van Clines M.D.   On: 03/30/2020 08:50   NM PET Image Restag (PS) Skull Base To Thigh  Result Date: 03/28/2020 CLINICAL DATA:  Subsequent treatment strategy for breast cancer. EXAM: NUCLEAR MEDICINE PET SKULL BASE TO THIGH TECHNIQUE: 10.2 mCi F-18 FDG was injected intravenously. Full-ring PET imaging was performed from the skull base to thigh after the radiotracer. CT data was obtained and used for attenuation correction and anatomic localization. Fasting blood glucose: 47  mg/dl COMPARISON:  12/12/2019. FINDINGS: Mediastinal blood pool activity: SUV max 1.9 Liver activity: SUV max NA NECK: No abnormal hypermetabolism. Incidental CT findings: None. CHEST: New and pre-existing hypermetabolic pleural/subpleural nodules are seen bilaterally. Index 1.4 x 1.8 cm juxta mediastinal subpleural right upper lobe nodule has an SUV max of 7.1, compared to 3.2 previously. 10 mm subcarinal lymph node has an SUV max of 3.8. No hypermetabolic axillary adenopathy. Lateral left breast mass measures similar, 1.8 x 3.3 cm with an SUV max 4.4, compared to 3.4 previously. Incidental CT findings: Right IJ Port-A-Cath terminates in the SVC. Atherosclerotic calcification of the aorta. Heart is enlarged. No pericardial effusion. Moderate right pleural effusion and small left pleural effusion, increased, with associated pleural thickening. ABDOMEN/PELVIS: New hypermetabolism in both adrenal glands, with an index SUV mass on the right of 4.4. The CT appearance of the adrenal glands is unchanged from 12/12/2019, however. No abnormal hypermetabolism in the liver, spleen or pancreas. Left external iliac lymph nodes are mildly hypermetabolic. Index 7 mm left external iliac lymph node (3/217) has increased slightly in size from 4 mm on 12/12/2019 and has an SUV max of 2.8. Uptake associated with soft tissue thickening along the right paramidline ventral abdominal  wall may be postoperative in etiology. Incidental CT findings: Liver is unremarkable. Cholecystectomy. Adrenal glands are otherwise unremarkable. 1.4 cm low-attenuation lesion off the right kidney is likely a cyst although definitive characterization is limited postcontrast imaging. Kidneys, spleen, pancreas, stomach and bowel are otherwise grossly unremarkable. SKELETON: Treated sclerotic lesions are seen. For example, an index lesion in the right acetabulum does not show abnormal hypermetabolism. There are new foci of hypermetabolism within the spine. Index 5 mm lytic lesion in the T10 vertebral body (3/117) has an SUV max of 4.8. Incidental CT findings: Degenerative changes in the spine. IMPRESSION: 1. Progressive pleuroparenchymal and osseous metastatic disease. 2. New hypermetabolic lymph nodes in the subcarinal station and left external iliac chain. 3. Similar hypermetabolic primary left breast mass. 4. New hypermetabolism within the adrenal glands, without definite CT correlate. Continued attention on follow-up exams is warranted. 5. Moderate right and small left pleural effusions, increased, with associated pleural thickening. 6.  Aortic atherosclerosis (ICD10-I70.0). Electronically Signed   By: Lorin Picket M.D.   On: 03/28/2020 10:51   DG Foot Complete Left  Result Date: 04/03/2020 CLINICAL DATA:  Fifth ray amputation EXAM: LEFT FOOT - COMPLETE 3+ VIEW COMPARISON:  None. FINDINGS: Interval postsurgical changes from transmetatarsal amputation of the fifth ray at the level of the proximal diaphysis. Resection margin is smooth. Expected postoperative changes within the distal soft tissues at the amputation site. No acute fracture. No malalignment. No new areas of cortical irregularity. IMPRESSION: Interval postsurgical changes from transmetatarsal amputation of the fifth ray at the level of the proximal diaphysis. Electronically Signed   By: Davina Poke D.O.   On: 04/03/2020 16:23   DG Foot  Complete Left  Result Date: 03/29/2020 CLINICAL DATA:  Left foot wound, initial encounter EXAM: LEFT FOOT - COMPLETE 3+ VIEW COMPARISON:  None. FINDINGS: Considerable soft tissue swelling is noted in the distal aspect of the foot. Some subcutaneous air is noted about the fifth MTP joint. These changes correspond to the given clinical history of localized infection. No definitive fracture or bony erosive changes are noted. No other focal abnormality is seen. IMPRESSION: Soft tissue swelling and subcutaneous air consistent with localized infection predominately about the fifth MTP joint. No definitive erosive changes are noted. If clinically indicated MRI  may be helpful for further evaluation. Electronically Signed   By: Inez Catalina M.D.   On: 03/29/2020 15:56   DG MINI C-ARM IMAGE ONLY  Result Date: 03/30/2020 There is no interpretation for this exam.  This order is for images obtained during a surgical procedure.  Please See "Surgeries" Tab for more information regarding the procedure.   ECHOCARDIOGRAM COMPLETE  Result Date: 03/30/2020    ECHOCARDIOGRAM REPORT   Patient Name:   SIRIA CALANDRO Date of Exam: 03/30/2020 Medical Rec #:  [id number]      Height:       62.0 in Accession #:    [id number]     Weight:       194.0 lb Date of Birth:  01/04/1956       BSA:          1.887 m Patient Age:    81 years       BP:           129/76 mmHg Patient Gender: F              HR:           88 bpm. Exam Location:  [facility] Procedure: 2D Echo, Cardiac Doppler and Color Doppler Indications:     Fever 780.6  History:         Patient has no prior history of Echocardiogram examinations.                  CHF, Stroke, Arrythmias:PAC; Risk Factors:Hypertension.  Sonographer:     Sherrie Sport RDCS (AE) Referring Phys:  [place] Diagnosing Phys: Serafina Royals MD IMPRESSIONS  1. Left ventricular ejection fraction, by estimation, is 55 to 60%. The left ventricle has normal function. The left ventricle has no regional wall  motion abnormalities. Left ventricular diastolic parameters were normal.  2. Right ventricular systolic function is normal. The right ventricular size is normal. There is normal pulmonary artery systolic pressure.  3. Left atrial size was mildly dilated.  4. The mitral valve is normal in structure. Mild to moderate mitral valve regurgitation.  5. The aortic valve is normal in structure. Aortic valve regurgitation is not visualized. FINDINGS  Left Ventricle: Left ventricular ejection fraction, by estimation, is 55 to 60%. The left ventricle has normal function. The left ventricle has no regional wall motion abnormalities. The left ventricular internal cavity size was normal in size. There is  no left ventricular hypertrophy. Left ventricular diastolic parameters were normal. Right Ventricle: The right ventricular size is normal. No increase in right ventricular wall thickness. Right ventricular systolic function is normal. There is normal pulmonary artery systolic pressure. The tricuspid regurgitant velocity is 2.42 m/s, and  with an assumed right atrial pressure of 10 mmHg, the estimated right ventricular systolic pressure is 81.8 mmHg. Left Atrium: Left atrial size was mildly dilated. Right Atrium: Right atrial size was normal in size. Pericardium: There is no evidence of pericardial effusion. Mitral Valve: The mitral valve is normal in structure. Mild to moderate mitral valve regurgitation. Tricuspid Valve: The tricuspid valve is normal in structure. Tricuspid valve regurgitation is trivial. Aortic Valve: The aortic valve is normal in structure. Aortic valve regurgitation is not visualized. Aortic valve mean gradient measures 6.0 mmHg. Aortic valve peak gradient measures 10.4 mmHg. Aortic valve area, by VTI measures 1.74 cm. Pulmonic Valve: The pulmonic valve was normal in structure. Pulmonic valve regurgitation is not visualized. Aorta: The aortic root and ascending aorta are structurally normal, with  no  evidence of dilitation. IAS/Shunts: No atrial level shunt detected by color flow Doppler.  LEFT VENTRICLE PLAX 2D LVIDd:         4.30 cm  Diastology LVIDs:         2.79 cm  LV e' lateral:   7.51 cm/s LV PW:         1.43 cm  LV E/e' lateral: 14.1 LV IVS:        1.17 cm  LV e' medial:    6.31 cm/s LVOT diam:     2.00 cm  LV E/e' medial:  16.8 LV SV:         58 LV SV Index:   31 LVOT Area:     3.14 cm  RIGHT VENTRICLE RV Basal diam:  2.73 cm RV S prime:     15.20 cm/s TAPSE (M-mode): 3.9 cm LEFT ATRIUM             Index       RIGHT ATRIUM           Index LA diam:        4.30 cm 2.28 cm/m  RA Area:     16.00 cm LA Vol (A2C):   65.2 ml 34.55 ml/m RA Volume:   41.00 ml  21.73 ml/m LA Vol (A4C):   42.3 ml 22.42 ml/m LA Biplane Vol: 55.4 ml 29.36 ml/m  AORTIC VALVE                    PULMONIC VALVE AV Area (Vmax):    1.67 cm     PV Vmax:        0.71 m/s AV Area (Vmean):   1.59 cm     PV Peak grad:   2.0 mmHg AV Area (VTI):     1.74 cm     RVOT Peak grad: 3 mmHg AV Vmax:           161.00 cm/s AV Vmean:          114.333 cm/s AV VTI:            0.337 m AV Peak Grad:      10.4 mmHg AV Mean Grad:      6.0 mmHg LVOT Vmax:         85.70 cm/s LVOT Vmean:        57.800 cm/s LVOT VTI:          0.186 m LVOT/AV VTI ratio: 0.55  AORTA Ao Root diam: 2.70 cm MITRAL VALVE                TRICUSPID VALVE MV Area (PHT): 5.66 cm     TR Peak grad:   23.4 mmHg MV Decel Time: 134 msec     TR Vmax:        242.00 cm/s MV E velocity: 106.00 cm/s MV A velocity: 150.00 cm/s  SHUNTS MV E/A ratio:  0.71         Systemic VTI:  0.19 m                             Systemic Diam: 2.00 cm Serafina Royals MD Electronically signed by Serafina Royals MD Signature Date/Time: 03/30/2020/1:20:11 PM    Final    Assessment/Plan The patient is a 64 year old female with multiple medical issues including 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  Osteomyelitis /  Sepsis: Patient is status post amputation by podiatry however was also found to have osteomyelitis and sepsis requiring prolonged IV antibiotics.  We will place a Hickman catheter to allow the patient to continue to receive IV antibiotics.  Procedure, risks and benefits were explained to the patient.  All questions were answered.  The patient wished to proceed.  2.  Diabetes: On appropriate medications. Encouraged good control as its slows the progression of atherosclerotic disease  3.  Chronic Kidney Disease: Due to the patient's chronic kidney disease status she is unable to have a PICC placed for long-term antibiotics. This is followed by the patient's nephrologist  Discussed with Dr. Francene Castle, PA-C  04/04/2020 3:50 PM

## 2020-04-04 NOTE — Progress Notes (Signed)
Mobility Specialist - Progress Note   04/04/20 1503  Mobility  Activity Refused mobility (Cancellation)  Mobility performed by Mobility specialist    Pt currently out of room and unavailable. Will re-attempt session at a later date/time when pt is available.    Timaya Bojarski Mobility Specialist  04/04/20, 3:05 PM

## 2020-04-04 NOTE — Telephone Encounter (Signed)
I tried to reach pt's son- Unable to LVM for pt's son- [name] as NO VM set up.  GB

## 2020-04-05 ENCOUNTER — Encounter: Payer: Self-pay | Admitting: Vascular Surgery

## 2020-04-05 DIAGNOSIS — N179 Acute kidney failure, unspecified: Secondary | ICD-10-CM

## 2020-04-05 DIAGNOSIS — E11621 Type 2 diabetes mellitus with foot ulcer: Secondary | ICD-10-CM | POA: Diagnosis present

## 2020-04-05 DIAGNOSIS — N183 Chronic kidney disease, stage 3 unspecified: Secondary | ICD-10-CM | POA: Diagnosis present

## 2020-04-05 DIAGNOSIS — R6520 Severe sepsis without septic shock: Secondary | ICD-10-CM

## 2020-04-05 DIAGNOSIS — M869 Osteomyelitis, unspecified: Secondary | ICD-10-CM | POA: Diagnosis present

## 2020-04-05 LAB — BASIC METABOLIC PANEL
Anion gap: 3 — ABNORMAL LOW (ref 5–15)
BUN: 23 mg/dL (ref 8–23)
CO2: 25 mmol/L (ref 22–32)
Calcium: 8.5 mg/dL — ABNORMAL LOW (ref 8.9–10.3)
Chloride: 112 mmol/L — ABNORMAL HIGH (ref 98–111)
Creatinine, Ser: 1.7 mg/dL — ABNORMAL HIGH (ref 0.44–1.00)
GFR calc Af Amer: 37 mL/min — ABNORMAL LOW (ref >60)
GFR calc non Af Amer: 32 mL/min — ABNORMAL LOW (ref >60)
Glucose, Bld: 169 mg/dL — ABNORMAL HIGH (ref 70–99)
Potassium: 4.3 mmol/L (ref 3.5–5.1)
Sodium: 140 mmol/L (ref 135–145)

## 2020-04-05 LAB — GLUCOSE, CAPILLARY
Glucose-Capillary: 151 mg/dL — ABNORMAL HIGH (ref 70–99)
Glucose-Capillary: 178 mg/dL — ABNORMAL HIGH (ref 70–99)
Glucose-Capillary: 180 mg/dL — ABNORMAL HIGH (ref 70–99)
Glucose-Capillary: 230 mg/dL — ABNORMAL HIGH (ref 70–99)

## 2020-04-05 LAB — CBC
HCT: 24.2 % — ABNORMAL LOW (ref 36.0–46.0)
Hemoglobin: 7.8 g/dL — ABNORMAL LOW (ref 12.0–15.0)
MCH: 29.7 pg (ref 26.0–34.0)
MCHC: 32.2 g/dL (ref 30.0–36.0)
MCV: 92 fL (ref 80.0–100.0)
Platelets: 475 10*3/uL — ABNORMAL HIGH (ref 150–400)
RBC: 2.63 MIL/uL — ABNORMAL LOW (ref 3.87–5.11)
RDW: 16.4 % — ABNORMAL HIGH (ref 11.5–15.5)
WBC: 14.1 10*3/uL — ABNORMAL HIGH (ref 4.0–10.5)
nRBC: 0.1 % (ref 0.0–0.2)

## 2020-04-05 LAB — SARS CORONAVIRUS 2 BY RT PCR (HOSPITAL ORDER, PERFORMED IN ~~LOC~~ HOSPITAL LAB): SARS Coronavirus 2: NEGATIVE

## 2020-04-05 LAB — MAGNESIUM: Magnesium: 2 mg/dL (ref 1.7–2.4)

## 2020-04-05 LAB — PARATHYROID HORMONE, INTACT (NO CA): PTH: 44 pg/mL (ref 15–65)

## 2020-04-05 MED ORDER — SODIUM CHLORIDE 0.9 % IV SOLN: INTRAVENOUS | Status: DC | PRN

## 2020-04-05 NOTE — Progress Notes (Signed)
Daijanae A Sroka   DOB:1955/08/07   [id number]    Subjective: Patient sitting in the chair.  No fevers.  No nausea vomiting.  Patient is upset that she has to go to rehab for IV antibiotics.  However she states that she wants to keep going/do the best for herself in order to resume chemotherapy.  Underwent tunneled catheter placement yesterday.  Objective:  Vitals:   04/05/20 0524 04/05/20 1127  BP: (!) 174/98 (!) 159/85  Pulse: 77 72  Resp: 18 16  Temp: 98.1 F (36.7 C) 97.6 F (36.4 C)  SpO2: 97% 98%     Intake/Output Summary (Last 24 hours) at 04/05/2020 1624 Last data filed at 04/05/2020 1000 Gross per 24 hour  Intake 369.17 ml  Output 600 ml  Net -230.83 ml    Physical Exam HENT:     Head: Normocephalic and atraumatic.     Mouth/Throat:     Pharynx: No oropharyngeal exudate.  Eyes:     Pupils: Pupils are equal, round, and reactive to light.  Cardiovascular:     Rate and Rhythm: Normal rate and regular rhythm.  Pulmonary:     Effort: No respiratory distress.     Breath sounds: No wheezing.     Comments: Decreased air entry bilaterally the bases. Abdominal:     General: Bowel sounds are normal. There is no distension.     Palpations: Abdomen is soft. There is no mass.     Tenderness: There is no abdominal tenderness. There is no guarding or rebound.  Musculoskeletal:        General: No tenderness. Normal range of motion.     Cervical back: Normal range of motion and neck supple.     Comments: Left foot in bandage.  Skin:    General: Skin is warm.  Neurological:     Mental Status: She is alert and oriented to person, place, and time.  Psychiatric:        Mood and Affect: Affect normal.      Labs:  Lab Results  Component Value Date   WBC 14.1 (H) 04/05/2020   HGB 7.8 (L) 04/05/2020   HCT 24.2 (L) 04/05/2020   MCV 92.0 04/05/2020   PLT 475 (H) 04/05/2020   NEUTROABS 23.2 (H) 03/29/2020    Lab Results  Component Value Date   NA 140 04/05/2020   K 4.3  04/05/2020   CL 112 (H) 04/05/2020   CO2 25 04/05/2020    Studies:  PERIPHERAL VASCULAR CATHETERIZATION  Result Date: 04/04/2020 See Op Note   Carcinoma of upper-inner quadrant of left breast in female, estrogen receptor positive [facility]) # 64 year old female patient with a history of metastatic breast cancer CKD stage IV; poorly controlled diabetes is currently admitted to hospital for left foot cellulitis/osteomyelitis  #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 [March 25, 2020]-shows progressive disease adrenals/bone/pleural parenchymal-consistent with recently rising tumor marker.  Again hold off starting chemotherapy given acute infection issues.  #Left lower extremity infection-cellulitis/osteomyelitis status post amputation-on broad-spectrum antibiotics; patient status post Hickman catheter-for IV antibiotics until September 27th as per ID.   # Chronic kidney disease - stage IV-GFR 19; stable.  Discussed with Dr. Juleen China.   # Anemia- hemoglobin today-8-9/CKD-stable on PO iron.  #Covid vaccination: Patient is interested to get her first dose in the hospital.  Discussed with hospitalist service/this has been arranged for tomorrow.    Cammie Sickle, MD 04/05/2020  4:24 PM

## 2020-04-05 NOTE — Progress Notes (Signed)
PROGRESS NOTE    CHAPEL SILVERTHORN  PIR:[id number] DOB: 1956-06-23 DOA: 03/29/2020 PCP: [name], MD    Assessment & Plan:   Principal Problem:   Sepsis ([place]) Active Problems:   Asthma   Benign essential HTN   Chronic kidney disease (CKD), stage V (HCC)   Chronic systolic CHF (congestive heart failure) (HCC)   Depression with anxiety   Type 2 diabetes mellitus with diabetic chronic kidney disease (HCC)   MI (mitral incompetence)   Carcinoma of upper-inner quadrant of left breast in female, estrogen receptor positive ([place])   Metastasis from malignant tumor of breast (HCC)   Anemia of chronic disease   Lactic acidosis    Gloria A Mebaneis a 64 y.o.femalewith medical history significant ofmetastatic breast cancer, diabetes mellitus, CKD stage III, CHF, presents in the emergency department with open draining left foot wound and associated fever.   Patient was admitted for sepsis secondary to infected left foot wound,  MRI consistent with osteomyelitis.  Podiatry consulted, Patient underwent fifth ray amputation,  tolerated well.  Patient is continued on IV antibiotics,   Podiatry is following , wound care needs to be arranged.  Infectious diseases consulted to to guide antibiotic regimen.  ID recommended continue vancomycin for known MRSA and Flagyl and cefepime.  Patient will need Hickman's catheter for long-term IV antibiotics given history of CKD stage III.  Vascular surgery consulted for Hickman catheter.   Sepsis secondary to infectedLeftfoot wound  Lactic acidosis: S/p fifth ray amputation YTK:ZSWF [id number]/42 lactic acid 2.1. Leukocytosis 25.5 Source foot infection. She received IV fluids in the ED, Lactic acid normalized. --Hickman cath 9/1 for continued IV abx PLAN: --continue vanc/Ancef/Flagyl for now --will transition to vanc/ceftriaxone and oral Flagyl at discharge, with stop date 04/27/20, per ID rec --follow up with ID in clinic week of  9/20  AKI on CKD stage IIIb:  Cr 2.74 on presentation.  Baseline creatinine around 2.1.  --Hold further MIVF --encourage oral hydration  Anemia of chronic disease: No obvious bleeding, hemoglobin is stable.  Hypertension: Continue home amlodipine, atenolol, clonidine  Diabetes mellitus Lantus 25u daily (reduced dose) SSI  Elevated LFTs:Could be due to metastatic lesion. Improved.  Metastatic breast cancer; She follows up outpatient with oncologist,  Hem / Oncology consulted.  Outpatient follow-up  Depression with anxiety: Continue home Prozac, and Xanax PRN   DVT prophylaxis: Lovenox SQ Code Status: Full code  Family Communication:  Status is: inpatient Dispo:   The patient is from: home Anticipated d/c is to: SNF Anticipated d/c date is: tomorrow Patient currently is medically stable to d/c.   Subjective and Interval History:  Pt reported doing ok.  Appetite not good.    Decided to go to SNF rehab.   Objective: Vitals:   04/04/20 1952 04/05/20 0035 04/05/20 0524 04/05/20 1127  BP: (!) 169/95 (!) 151/94 (!) 174/98 (!) 159/85  Pulse: 83 75 77 72  Resp: 20 20 18 16   Temp: 98 F (36.7 C) 98.2 F (36.8 C) 98.1 F (36.7 C) 97.6 F (36.4 C)  TempSrc: Oral Oral Oral Oral  SpO2: 98% 97% 97% 98%  Weight:      Height:        Intake/Output Summary (Last 24 hours) at 04/05/2020 1920 Last data filed at 04/05/2020 1000 Gross per 24 hour  Intake 250 ml  Output 600 ml  Net -350 ml   Filed Weights   03/29/20 1119 04/04/20 1417  Weight: 88 kg 88.5 kg    Examination:  Constitutional: NAD, AAOx3 HEENT: conjunctivae and lids normal, EOMI CV: RRR no M,R,G. Distal pulses +2.  No cyanosis.   RESP: CTA B/L, normal respiratory effort  GI: +BS, NTND Extremities: No effusions, edema in BLE.  Left foot wrapped. SKIN: warm, dry and intact Neuro: II - XII grossly intact.  Sensation intact Psych: Normal mood and affect.     Data Reviewed: I have personally  reviewed following labs and imaging studies  CBC: Recent Labs  Lab 04/01/20 0705 04/02/20 0710 04/03/20 0821 04/04/20 0505 04/05/20 0430  WBC 16.4* 11.5* 12.4* 13.9* 14.1*  HGB 7.4* 8.1* 8.8* 8.4* 7.8*  HCT 23.0* 24.5* 26.9* 25.4* 24.2*  MCV 92.4 91.1 91.5 91.0 92.0  PLT 433* 450* 504* 483* 885*   Basic Metabolic Panel: Recent Labs  Lab 03/31/20 0530 03/31/20 0530 04/01/20 0705 04/02/20 0710 04/03/20 0821 04/04/20 0505 04/05/20 0430  NA 135   < > 137 140 137 139 140  K 5.1   < > 4.4 4.6 4.7 4.0 4.3  CL 106   < > 109 112* 109 110 112*  CO2 24   < > 21* 21* 21* 21* 25  GLUCOSE 347*   < > 96 140* 217* 82 169*  BUN 39*   < > 38* 33* 30* 27* 23  CREATININE 2.43*   < > 2.39* 2.10* 1.92* 1.90* 1.70*  CALCIUM 8.0*   < > 8.4* 8.3* 8.6* 8.7* 8.5*  MG 2.1  --   --  2.2 2.1 2.1 2.0  PHOS 3.9  --   --  2.6 2.3* 2.9  --    < > = values in this interval not displayed.   GFR: Estimated Creatinine Clearance: 35 mL/min (A) (by C-G formula based on SCr of 1.7 mg/dL (H)). Liver Function Tests: Recent Labs  Lab 03/30/20 0453  AST 30  ALT 38  ALKPHOS 77  BILITOT 1.0  PROT 6.9  ALBUMIN 2.6*   No results for input(s): LIPASE, AMYLASE in the last 168 hours. No results for input(s): AMMONIA in the last 168 hours. Coagulation Profile: Recent Labs  Lab 03/30/20 0453  INR 1.3*   Cardiac Enzymes: No results for input(s): CKTOTAL, CKMB, CKMBINDEX, TROPONINI in the last 168 hours. BNP (last 3 results) No results for input(s): PROBNP in the last 8760 hours. HbA1C: Recent Labs    04/04/20 0505  HGBA1C 8.0*   CBG: Recent Labs  Lab 04/04/20 1758 04/04/20 2144 04/05/20 0750 04/05/20 1124 04/05/20 1643  GLUCAP 163* 230* 151* 178* 180*   Lipid Profile: No results for input(s): CHOL, HDL, LDLCALC, TRIG, CHOLHDL, LDLDIRECT in the last 72 hours. Thyroid Function Tests: No results for input(s): TSH, T4TOTAL, FREET4, T3FREE, THYROIDAB in the last 72 hours. Anemia Panel: No  results for input(s): VITAMINB12, FOLATE, FERRITIN, TIBC, IRON, RETICCTPCT in the last 72 hours. Sepsis Labs: Recent Labs  Lab 03/30/20 0453  PROCALCITON 6.02    Recent Results (from the past 240 hour(s))  Blood culture (routine x 2)     Status: None   Collection Time: 03/29/20  3:51 PM   Specimen: BLOOD  Result Value Ref Range Status   Specimen Description BLOOD BLOOD RIGHT HAND  Final   Special Requests   Final    BOTTLES DRAWN AEROBIC AND ANAEROBIC Blood Culture adequate volume   Culture   Final    NO GROWTH 5 DAYS Performed at [facility], [address], [place], [place] [address]    Report Status 04/03/2020 FINAL  Final  Blood culture (routine x 2)     Status: None   Collection Time: 03/29/20  3:51 PM   Specimen: BLOOD  Result Value Ref Range Status   Specimen Description BLOOD LEFT ANTECUBITAL  Final   Special Requests   Final    BOTTLES DRAWN AEROBIC AND ANAEROBIC Blood Culture adequate volume   Culture   Final    NO GROWTH 5 DAYS Performed at [facility], [address] [place], [place] [address]    Report Status 04/03/2020 FINAL  Final  SARS Coronavirus 2 by RT PCR (hospital order, performed in [facility] hospital lab) Nasopharyngeal Nasopharyngeal Swab     Status: None   Collection Time: 03/29/20  5:15 PM   Specimen: Nasopharyngeal Swab  Result Value Ref Range Status   SARS Coronavirus 2 NEGATIVE NEGATIVE Final    Comment: (NOTE) SARS-CoV-2 target nucleic acids are NOT DETECTED.  The SARS-CoV-2 RNA is generally detectable in upper and lower respiratory specimens during the acute phase of infection. The lowest concentration of SARS-CoV-2 viral copies this assay can detect is 250 copies / mL. A negative result does not preclude SARS-CoV-2 infection and should not be used as the sole basis for treatment or other patient management decisions.  A negative result may occur with improper specimen collection / handling, submission of specimen  other than nasopharyngeal swab, presence of viral mutation(s) within the areas targeted by this assay, and inadequate number of viral copies (<250 copies / mL). A negative result must be combined with clinical observations, patient history, and epidemiological information.  Fact Sheet for Patients:   StrictlyIdeas.no  Fact Sheet for Healthcare Providers: BankingDealers.co.za  This test is not yet approved or  cleared by the [place] FDA and has been authorized for detection and/or diagnosis of SARS-CoV-2 by FDA under an Emergency Use Authorization (EUA).  This EUA will remain in effect (meaning this test can be used) for the duration of the COVID-19 declaration under Section 564(b)(1) of the Act, 21 U.S.C. section 360bbb-3(b)(1), unless the authorization is terminated or revoked sooner.  Performed at [facility], [address] [place], [place] [address]   Aerobic/Anaerobic Culture (surgical/deep wound)     Status: None   Collection Time: 03/30/20  1:19 PM   Specimen: PATH Other; Tissue  Result Value Ref Range Status   Specimen Description   Final    WOUND Performed at [facility], [address] [place], [place] [address]    Special Requests   Final    LEFT 5TH TOE JOINT INFECTION Performed at [facility], [place], [place] [address]    Gram Stain NO WBC SEEN RARE GRAM POSITIVE COCCI IN PAIRS   Final   Culture   Final    FEW ESCHERICHIA COLI NO ANAEROBES ISOLATED Performed at [facility], [place] [address] [place], [place] [address]    Report Status 04/04/2020 FINAL  Final   Organism ID, Bacteria ESCHERICHIA COLI  Final      Susceptibility   Escherichia coli - MIC*    AMPICILLIN >=32 RESISTANT Resistant     CEFAZOLIN <=4 SENSITIVE Sensitive     CEFEPIME <=0.12 SENSITIVE Sensitive     CEFTAZIDIME <=1 SENSITIVE Sensitive     CEFTRIAXONE <=0.25 SENSITIVE Sensitive      CIPROFLOXACIN <=0.25 SENSITIVE Sensitive     GENTAMICIN >=16 RESISTANT Resistant     IMIPENEM <=0.25 SENSITIVE Sensitive     TRIMETH/SULFA >=320 RESISTANT Resistant  AMPICILLIN/SULBACTAM >=32 RESISTANT Resistant     PIP/TAZO <=4 SENSITIVE Sensitive     * FEW ESCHERICHIA COLI  MRSA PCR Screening     Status: None   Collection Time: 04/02/20  8:11 AM   Specimen: Nasopharyngeal  Result Value Ref Range Status   MRSA by PCR NEGATIVE NEGATIVE Final    Comment:        The GeneXpert MRSA Assay (FDA approved for NASAL specimens only), is one component of a comprehensive MRSA colonization surveillance program. It is not intended to diagnose MRSA infection nor to guide or monitor treatment for MRSA infections. Performed at [facility], [address] [place], [place] [address]   SARS Coronavirus 2 by RT PCR (hospital order, performed in [facility] hospital lab) Nasopharyngeal Nasopharyngeal Swab     Status: None   Collection Time: 04/05/20  2:15 PM   Specimen: Nasopharyngeal Swab  Result Value Ref Range Status   SARS Coronavirus 2 NEGATIVE NEGATIVE Final    Comment: (NOTE) SARS-CoV-2 target nucleic acids are NOT DETECTED.  The SARS-CoV-2 RNA is generally detectable in upper and lower respiratory specimens during the acute phase of infection. The lowest concentration of SARS-CoV-2 viral copies this assay can detect is 250 copies / mL. A negative result does not preclude SARS-CoV-2 infection and should not be used as the sole basis for treatment or other patient management decisions.  A negative result may occur with improper specimen collection / handling, submission of specimen other than nasopharyngeal swab, presence of viral mutation(s) within the areas targeted by this assay, and inadequate number of viral copies (<250 copies / mL). A negative result must be combined with clinical observations, patient history, and epidemiological information.  Fact Sheet for  Patients:   StrictlyIdeas.no  Fact Sheet for Healthcare Providers: BankingDealers.co.za  This test is not yet approved or  cleared by the [place] FDA and has been authorized for detection and/or diagnosis of SARS-CoV-2 by FDA under an Emergency Use Authorization (EUA).  This EUA will remain in effect (meaning this test can be used) for the duration of the COVID-19 declaration under Section 564(b)(1) of the Act, 21 U.S.C. section 360bbb-3(b)(1), unless the authorization is terminated or revoked sooner.  Performed at [facility], [address] [place], [place] [address]       Radiology Studies: PERIPHERAL VASCULAR CATHETERIZATION  Result Date: 04/04/2020 See Op Note    Scheduled Meds: . amLODipine  10 mg Oral Daily  . aspirin EC  81 mg Oral Daily  . atenolol  100 mg Oral BID  . calcitRIOL  0.25 mcg Oral Daily  . cloNIDine  0.2 mg Oral BID  . docusate sodium  100 mg Oral BID  . enoxaparin (LOVENOX) injection  40 mg Subcutaneous Q24H  . famotidine  20 mg Oral Daily  . ferrous sulfate  325 mg Oral BID WC  . FLUoxetine  20 mg Oral BID  . insulin aspart  0-5 Units Subcutaneous QHS  . insulin aspart  0-9 Units Subcutaneous TID WC  . insulin detemir  25 Units Subcutaneous Daily  . senna  1 tablet Oral BID  . simvastatin  20 mg Oral QPM   Continuous Infusions: . sodium chloride 10 mL/hr at 04/05/20 1741  .  ceFAZolin (ANCEF) IV 1 g (04/05/20 1258)  . metronidazole 500 mg (04/05/20 1743)  . vancomycin 150 mL/hr at 04/04/20 1243     LOS: 7 days     Enzo Bi, MD [facility] If 7PM-7AM,  please contact night-coverage 04/05/2020, 7:20 PM

## 2020-04-05 NOTE — Progress Notes (Signed)
Infectious Disease Long Term IV Antibiotic Orders Cassidi A Betty Dec 26, 1955  Diagnosis: DM osteomyelitis   Culture results Results for orders placed or performed during the hospital encounter of 03/29/20  Blood culture (routine x 2)     Status: None   Collection Time: 03/29/20  3:51 PM   Specimen: BLOOD  Result Value Ref Range Status   Specimen Description BLOOD BLOOD RIGHT HAND  Final   Special Requests   Final    BOTTLES DRAWN AEROBIC AND ANAEROBIC Blood Culture adequate volume   Culture   Final    NO GROWTH 5 DAYS Performed at [facility], [address] [place], [place] [address]    Report Status 04/03/2020 FINAL  Final  Blood culture (routine x 2)     Status: None   Collection Time: 03/29/20  3:51 PM   Specimen: BLOOD  Result Value Ref Range Status   Specimen Description BLOOD LEFT ANTECUBITAL  Final   Special Requests   Final    BOTTLES DRAWN AEROBIC AND ANAEROBIC Blood Culture adequate volume   Culture   Final    NO GROWTH 5 DAYS Performed at [facility], [address] [place], [place] [address]    Report Status 04/03/2020 FINAL  Final  SARS Coronavirus 2 by RT PCR (hospital order, performed in [facility] hospital lab) Nasopharyngeal Nasopharyngeal Swab     Status: None   Collection Time: 03/29/20  5:15 PM   Specimen: Nasopharyngeal Swab  Result Value Ref Range Status   SARS Coronavirus 2 NEGATIVE NEGATIVE Final    Comment: (NOTE) SARS-CoV-2 target nucleic acids are NOT DETECTED.  The SARS-CoV-2 RNA is generally detectable in upper and lower respiratory specimens during the acute phase of infection. The lowest concentration of SARS-CoV-2 viral copies this assay can detect is 250 copies / mL. A negative result does not preclude SARS-CoV-2 infection and should not be used as the sole basis for treatment or other patient management decisions.  A negative result may occur with improper specimen collection / handling, submission of specimen  other than nasopharyngeal swab, presence of viral mutation(s) within the areas targeted by this assay, and inadequate number of viral copies (<250 copies / mL). A negative result must be combined with clinical observations, patient history, and epidemiological information.  Fact Sheet for Patients:   StrictlyIdeas.no  Fact Sheet for Healthcare Providers: BankingDealers.co.za  This test is not yet approved or  cleared by the [place] FDA and has been authorized for detection and/or diagnosis of SARS-CoV-2 by FDA under an Emergency Use Authorization (EUA).  This EUA will remain in effect (meaning this test can be used) for the duration of the COVID-19 declaration under Section 564(b)(1) of the Act, 21 U.S.C. section 360bbb-3(b)(1), unless the authorization is terminated or revoked sooner.  Performed at [facility], [address] [place], [place] [address]   Aerobic/Anaerobic Culture (surgical/deep wound)     Status: None   Collection Time: 03/30/20  1:19 PM   Specimen: PATH Other; Tissue  Result Value Ref Range Status   Specimen Description   Final    WOUND Performed at [facility], [address] [place], [place] [address]    Special Requests   Final    LEFT 5TH TOE JOINT INFECTION Performed at [facility], [place], [place] [address]    Gram Stain NO WBC SEEN RARE GRAM POSITIVE COCCI IN PAIRS   Final   Culture   Final  FEW ESCHERICHIA COLI NO ANAEROBES ISOLATED Performed at [facility], [address], [place], [place] [address]    Report Status 04/04/2020 FINAL  Final   Organism ID, Bacteria ESCHERICHIA COLI  Final      Susceptibility   Escherichia coli - MIC*    AMPICILLIN >=32 RESISTANT Resistant     CEFAZOLIN <=4 SENSITIVE Sensitive     CEFEPIME <=0.12 SENSITIVE Sensitive     CEFTAZIDIME <=1 SENSITIVE Sensitive     CEFTRIAXONE <=0.25 SENSITIVE Sensitive      CIPROFLOXACIN <=0.25 SENSITIVE Sensitive     GENTAMICIN >=16 RESISTANT Resistant     IMIPENEM <=0.25 SENSITIVE Sensitive     TRIMETH/SULFA >=320 RESISTANT Resistant     AMPICILLIN/SULBACTAM >=32 RESISTANT Resistant     PIP/TAZO <=4 SENSITIVE Sensitive     * FEW ESCHERICHIA COLI  MRSA PCR Screening     Status: None   Collection Time: 04/02/20  8:11 AM   Specimen: Nasopharyngeal  Result Value Ref Range Status   MRSA by PCR NEGATIVE NEGATIVE Final    Comment:        The GeneXpert MRSA Assay (FDA approved for NASAL specimens only), is one component of a comprehensive MRSA colonization surveillance program. It is not intended to diagnose MRSA infection nor to guide or monitor treatment for MRSA infections. Performed at [facility], [address] [place], [place] [address]      LABS Lab Results  Component Value Date   CREATININE 1.70 (H) 04/05/2020   Lab Results  Component Value Date   WBC 14.1 (H) 04/05/2020   HGB 7.8 (L) 04/05/2020   HCT 24.2 (L) 04/05/2020   MCV 92.0 04/05/2020   PLT 475 (H) 04/05/2020   Lab Results  Component Value Date   ESRSEDRATE 106 (H) 03/29/2020   Lab Results  Component Value Date   CRP 20.2 (H) 03/29/2020    Allergies:  Allergies  Allergen Reactions  . Chlorhexidine   . Fish-Derived Products   . Sulfamethoxazole-Trimethoprim Other (See Comments)    Discharge antibiotics Vancomycin      1500            mg  every       48        hours .     Goal vancomycin trough 15-20.    Pharmacy to adjust dosing based on levels Ceftriaxone 2 grams every        24       hours Oral flagyl 500 mg TID for first 2 weeks PICC Care per protocol Labs weekly while on IV antibiotics -FAX weekly labs to [phone] CBC w diff   Comprehensive met panel Vancomycin Trough   CRP   Planned duration of antibiotics  Stop date Sept 24th Follow up clinic date Week of Sept 20th   Leonel Ramsay, MD

## 2020-04-05 NOTE — Progress Notes (Signed)
Physical Therapy Treatment Patient Details Name: [name] MRN: [id number] DOB: [birth year] Today's Date: 04/05/2020    History of Present Illness Per MD note:[name] is a 64 y.o. female with medical history significant of metastatic breast cancer, diabetes mellitus, CKD stage III, CHF, presents in the emergency department with open draining left foot wound and associated fever.  Patient reports having this wound for a while,  she has recently established care with the wound clinic but it has recently gotten worse and started draining.  She reports seeing her primary care physician and was started on Keflex and then which was recently switched to Bactrim but she is not seeing any improvement.  She reports having fever,  associated chills and body ache.  she denies any shortness of breath,  cough. 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    PT Comments    Pt sitting up in recliner chair upon arrival to room and excited to participate in PT this morning. Pt continues to require difficulty coming into full upright standing position remaining flexed at hips and R knee requiring max A and increased verbal cues to extend hips, R knee, and bilateral elbows on RW. Pt has difficulty maintaining NWB on L foot as clinician felt pressure through foot placed under patients. Pt performed BUE and BLE therex with emphasis on triceps, glutes, and quads. Pt with improved tolerance to activities today increasing reps and sets. Pt very motivated to get stronger and wanting to improved functional mobility. Pt making slow but steady progress. Current discharge recommendations remain appropriate. If pt refuses to go to STR, will need HHPT to address functional limitations.   Follow Up Recommendations  SNF     Equipment Recommendations  Wheelchair (measurements PT);Wheelchair cushion (measurements PT)    Recommendations for Other  Services       Precautions / Restrictions Precautions Precautions: Fall Restrictions Weight Bearing Restrictions: Yes LLE Weight Bearing: Non weight bearing    Mobility  Bed Mobility               General bed mobility comments: not assessed - pt in chair upon arrival  Transfers Overall transfer level: Needs assistance Equipment used: Rolling walker (2 wheeled) Transfers: Sit to/from Stand Sit to Stand: Max assist         General transfer comment: Pt performed sit <> stand transfer and able to come into standing after several attempts; required max A for boosting hips, remaining upright, and eccentric control on descent   Ambulation/Gait             General Gait Details: not safe to attempt   Stairs             Wheelchair Mobility    Modified Rankin (Stroke Patients Only)       Balance Overall balance assessment: Needs assistance Sitting-balance support: Feet supported Sitting balance-Leahy Scale: Good Sitting balance - Comments: no LOB with unsupported back   Standing balance support: Bilateral upper extremity supported Standing balance-Leahy Scale: Poor Standing balance comment: max A for standing balance with BUE on RW                            Cognition Arousal/Alertness: Awake/alert Behavior During Therapy: WFL for tasks assessed/performed Overall Cognitive Status: Within Functional Limits for tasks assessed  Exercises Other Exercises Other Exercises: pt performed open and closed chain exercises on RLE and open chain exercises on LLE x 15 reps, 2-3 sets Other Exercises: bilat. tricep presses, resistive RLE leg presses, partial sit <> stands, and open chain BUE therex x 15 reps, 2-3 sets    General Comments        Pertinent Vitals/Pain Pain Assessment: No/denies pain    Home Living                      Prior Function            PT Goals (current  goals can now be found in the care plan section) Acute Rehab PT Goals Patient Stated Goal: to walk PT Goal Formulation: Patient unable to participate in goal setting Time For Goal Achievement: 04/14/20 Potential to Achieve Goals: Fair Progress towards PT goals: Progressing toward goals    Frequency    Min 2X/week      PT Plan Current plan remains appropriate    Co-evaluation              AM-PAC PT "6 Clicks" Mobility   Outcome Measure  Help needed turning from your back to your side while in a flat bed without using bedrails?: A Little Help needed moving from lying on your back to sitting on the side of a flat bed without using bedrails?: A Lot Help needed moving to and from a bed to a chair (including a wheelchair)?: Total Help needed standing up from a chair using your arms (e.g., wheelchair or bedside chair)?: Total Help needed to walk in hospital room?: Total Help needed climbing 3-5 steps with a railing? : Total 6 Click Score: 9    End of Session Equipment Utilized During Treatment: Gait belt Activity Tolerance: Patient tolerated treatment well;Patient limited by fatigue Patient left: in chair;with call bell/phone within reach Nurse Communication: Mobility status PT Visit Diagnosis: Repeated falls (R29.6);Muscle weakness (generalized) (M62.81);Difficulty in walking, not elsewhere classified (R26.2)     Time: 5277-8242 PT Time Calculation (min) (ACUTE ONLY): 40 min  Charges:                        Vale Haven, SPT   Vale Haven 04/05/2020, 1:22 PM

## 2020-04-05 NOTE — TOC Progression Note (Signed)
Transition of Care [facility]) - Progression Note    Patient Details  Name: [name] MRN: [id number] Date of Birth: 10-31-1955  [facility]) CM/SW Contact  Beverly Sessions, RN Phone Number: 04/05/2020, 4:53 PM  Clinical Narrative:    Received call from [facility] stating they do not manage the patient, and insurance auth would have to be requesting through [place].   [facility] notified as they will have to start auth   Expected Discharge Plan: [place] Barriers to Discharge: No Barriers Identified  Expected Discharge Plan and Services Expected Discharge Plan: [place] In-house Referral: NA Discharge Planning Services: NA Post Acute Care Choice: [place] arrangements for the past 2 months: Single Family Home                 DME Arranged: N/A DME Agency: [employer]       HH Arranged: NA HH Agency: [facility] ([facility] [employer])         Social Determinants of Health ([place]) Interventions    Readmission Risk Interventions Readmission Risk Prevention Plan 04/01/2020  Transportation Screening Complete  Medication Review Press photographer) Complete  PCP or Specialist appointment within 3-5 days of discharge Complete  HRI or [place] Complete  SW Recovery Care/Counseling Consult Complete  [place] Not Applicable  Some recent data might be hidden

## 2020-04-05 NOTE — TOC Progression Note (Signed)
Transition of Care [facility]) - Progression Note    Patient Details  Name: [name] MRN: [id number] Date of Birth: [birth year]  Transition of Care [facility]) CM/SW Contact  Beverly Sessions, RN Phone Number: 04/05/2020, 2:05 PM  Clinical Narrative:     Bed offer presented.  [facility]. [facility] in the [facility] Wound vac is off.  Patient will require IV antibiotics.  [facility] is aware.    Insurance auth submitted Rapid covid test requested  Expected Discharge Plan: Skilled Nursing Facility Barriers to Discharge: No Barriers Identified  Expected Discharge Plan and Services Expected Discharge Plan: [place] In-house Referral: NA Discharge Planning Services: NA Post Acute Care Choice: [place] arrangements for the past 2 months: Single Family Home                 DME Arranged: N/A DME Agency: [employer]       HH Arranged: NA HH Agency: [facility] [employer])         Social Determinants of Health ([place]) Interventions    Readmission Risk Interventions Readmission Risk Prevention Plan 04/01/2020  Transportation Screening Complete  Medication Review Press photographer) Complete  PCP or Specialist appointment within 3-5 days of discharge Complete  HRI or [place] Complete  SW Recovery Care/Counseling Consult Complete  [place] Not Applicable  Some recent data might be hidden

## 2020-04-05 NOTE — Progress Notes (Signed)
PHARMACY CONSULT NOTE FOR:  OUTPATIENT  PARENTERAL ANTIBIOTIC THERAPY (OPAT)  Indication: foot osteomyelitis Regimen: vancomycin 1500mg  IV q48h (next dose ue 9/3 at 10am), Ceftriaxone 2gm IV q24h Plus oral antibiotic - metronidazole 500mg  PO TID End date: 04/27/2020   IV antibiotic discharge orders are pended. To discharging provider:  please sign these orders via discharge navigator,  Select New Orders & click on the button choice - Manage This Unsigned Work.     Thank you for allowing pharmacy to be a part of this patient's care.  Doreene Eland, PharmD, BCPS.   Work Cell: [phone] 04/05/2020 1:56 PM

## 2020-04-06 ENCOUNTER — Ambulatory Visit: Payer: Medicare HMO | Attending: Internal Medicine

## 2020-04-06 ENCOUNTER — Telehealth: Payer: Self-pay | Admitting: Internal Medicine

## 2020-04-06 DIAGNOSIS — Z170 Estrogen receptor positive status [ER+]: Secondary | ICD-10-CM

## 2020-04-06 DIAGNOSIS — Z23 Encounter for immunization: Secondary | ICD-10-CM

## 2020-04-06 LAB — BASIC METABOLIC PANEL
Anion gap: 6 (ref 5–15)
BUN: 23 mg/dL (ref 8–23)
CO2: 22 mmol/L (ref 22–32)
Calcium: 8.6 mg/dL — ABNORMAL LOW (ref 8.9–10.3)
Chloride: 110 mmol/L (ref 98–111)
Creatinine, Ser: 1.75 mg/dL — ABNORMAL HIGH (ref 0.44–1.00)
GFR calc Af Amer: 35 mL/min — ABNORMAL LOW (ref >60)
GFR calc non Af Amer: 30 mL/min — ABNORMAL LOW (ref >60)
Glucose, Bld: 151 mg/dL — ABNORMAL HIGH (ref 70–99)
Potassium: 4.4 mmol/L (ref 3.5–5.1)
Sodium: 138 mmol/L (ref 135–145)

## 2020-04-06 LAB — CBC
HCT: 23.8 % — ABNORMAL LOW (ref 36.0–46.0)
Hemoglobin: 8.1 g/dL — ABNORMAL LOW (ref 12.0–15.0)
MCH: 30.3 pg (ref 26.0–34.0)
MCHC: 34 g/dL (ref 30.0–36.0)
MCV: 89.1 fL (ref 80.0–100.0)
Platelets: 464 10*3/uL — ABNORMAL HIGH (ref 150–400)
RBC: 2.67 MIL/uL — ABNORMAL LOW (ref 3.87–5.11)
RDW: 16.7 % — ABNORMAL HIGH (ref 11.5–15.5)
WBC: 15.3 10*3/uL — ABNORMAL HIGH (ref 4.0–10.5)
nRBC: 0.1 % (ref 0.0–0.2)

## 2020-04-06 LAB — IRON AND TIBC
Iron: 49 ug/dL (ref 28–170)
Saturation Ratios: 30 % (ref 10.4–31.8)
TIBC: 162 ug/dL — ABNORMAL LOW (ref 250–450)
UIBC: 113 ug/dL

## 2020-04-06 LAB — GLUCOSE, CAPILLARY
Glucose-Capillary: 151 mg/dL — ABNORMAL HIGH (ref 70–99)
Glucose-Capillary: 218 mg/dL — ABNORMAL HIGH (ref 70–99)
Glucose-Capillary: 214 mg/dL — ABNORMAL HIGH (ref 70–99)
Glucose-Capillary: 162 mg/dL — ABNORMAL HIGH (ref 70–99)

## 2020-04-06 LAB — FOLATE: Folate: 4.9 ng/mL — ABNORMAL LOW (ref >5.9)

## 2020-04-06 LAB — MAGNESIUM: Magnesium: 2.1 mg/dL (ref 1.7–2.4)

## 2020-04-06 MED ORDER — BUMETANIDE 0.5 MG PO TABS
0.5000 mg | ORAL_TABLET | Freq: Two times a day (BID) | ORAL | Status: DC
  Administered 2020-04-06 – 2020-04-09 (×7): 0.5 mg via ORAL
  Filled 2020-04-06 (×8): qty 1

## 2020-04-06 MED ORDER — ENALAPRIL MALEATE 20 MG PO TABS
20.0000 mg | ORAL_TABLET | Freq: Two times a day (BID) | ORAL | Status: DC
  Administered 2020-04-06 – 2020-04-09 (×7): 20 mg via ORAL
  Filled 2020-04-06 (×8): qty 1

## 2020-04-06 MED ORDER — DOCUSATE SODIUM 100 MG PO CAPS: 100.0000 mg | ORAL_CAPSULE | Freq: Two times a day (BID) | ORAL | 0 refills | Status: DC

## 2020-04-06 MED ORDER — METRONIDAZOLE 500 MG PO TABS: 500.0000 mg | ORAL_TABLET | Freq: Three times a day (TID) | ORAL | 0 refills | Status: AC

## 2020-04-06 MED ORDER — SENNA 8.6 MG PO TABS: 1.0000 | ORAL_TABLET | Freq: Two times a day (BID) | ORAL | 0 refills | Status: AC | PRN

## 2020-04-06 MED ORDER — LEVEMIR FLEXTOUCH 100 UNIT/ML ~~LOC~~ SOPN: 25.0000 [IU] | PEN_INJECTOR | Freq: Every day | SUBCUTANEOUS | 11 refills | Status: DC

## 2020-04-06 MED ORDER — CEFTRIAXONE IV (FOR PTA / DISCHARGE USE ONLY): 2.0000 g | INTRAVENOUS | 0 refills | Status: AC

## 2020-04-06 MED ORDER — GLUCERNA SHAKE PO LIQD
237.0000 mL | Freq: Two times a day (BID) | ORAL | Status: DC
  Administered 2020-04-06 – 2020-04-09 (×6): 237 mL via ORAL

## 2020-04-06 MED ORDER — NOVOLOG FLEXPEN 100 UNIT/ML ~~LOC~~ SOPN: 5.0000 [IU] | PEN_INJECTOR | Freq: Three times a day (TID) | SUBCUTANEOUS | 11 refills | Status: DC

## 2020-04-06 MED ORDER — VANCOMYCIN IV (FOR PTA / DISCHARGE USE ONLY): 1500.0000 mg | INTRAVENOUS | 0 refills | Status: AC

## 2020-04-06 NOTE — Telephone Encounter (Signed)
C-Please have patient follow-up EARLY in the week of 20th- MD; labs- cbc/cmp/ca-27-29; No chemo. Dr.B  [facility].  Thanks GB

## 2020-04-06 NOTE — Progress Notes (Signed)
Pharmacy Antibiotic Note  [name] is a 64 y.o. female admitted on 03/29/2020 with MRSA cellulitis pending I&D.  Pharmacy has been consulted for  vancomycin and cefepime dosing. Patient has PMH of CKD stage V, DM,  CHF, and metastatic breast cancer. Since admission her renal function has improved (perhaps currently below baseline on 8/31).  Patient has I&D 8/27  Today, 04/06/2020  Day #8 antibiotics - currently vancomycin + cefazolin + metronidzole  SCr improved to now < 2 (appears below baseline)  Afebrile,  WBC  15.3  Cultures - 8/23 (wound clinic): MRSA; 8/27 L 5th toe: E. Coli  OR pathology: acute osteomyelitis (inflammation at bone and soft tissue margins)   Plan:     vancomycin 1500mg  IV q48h (goal trough 15-20).  (Suspect q24h regimen would be too much for her kidneys)  cefazolin 2gm IV q8h  Daily SCr while on vancomycin to assess renal function  Per ID, plan is IV antibiotics at discharge- OPAT complete 9/2 for Vanc/Cefazolin/Metronidazole thru 9/24.    Height: 5\' 2"  (157.5 cm) Weight: 88.5 kg (195 lb) IBW/kg (Calculated) : 50.1  Temp (24hrs), Avg:97.9 F (36.6 C), Min:97.6 F (36.4 C), Max:98.2 F (36.8 C)  Recent Labs  Lab 04/02/20 0710 04/02/20 1716 04/03/20 0821 04/04/20 0505 04/05/20 0430 04/06/20 0454  WBC 11.5*  --  12.4* 13.9* 14.1* 15.3*  CREATININE 2.10*  --  1.92* 1.90* 1.70* 1.75*  VANCOTROUGH  --  10*  --   --   --   --     Estimated Creatinine Clearance: 34 mL/min (A) (by C-G formula based on SCr of 1.75 mg/dL (H)).    Allergies  Allergen Reactions  . Chlorhexidine   . Fish-Derived Products   . Sulfamethoxazole-Trimethoprim Other (See Comments)    Antimicrobials this admission: 8/26 Cefepime >> 8/30 8/26 Vancomycin  >>  8/24 Metronidazole >> 8/30 Cefazolin>>    Microbiology results: 8/23 WCx: MRSA 8/27 WCx: E coli 8/26 BCx: NGTD 8/26 SARS CoV-2: negative 8/30 MRSA PCR: negative  Thank you for allowing pharmacy to be a  part of this patient's care.  Chinita Greenland PharmD Clinical Pharmacist 04/06/2020  \

## 2020-04-06 NOTE — Progress Notes (Signed)
Kytzia A Leano   DOB:06/28/1956   MR#:[id number]    Subjective: No fever no chills.  Appetite is improving.  No nausea no vomiting.  She is [facility].  Awaiting bed.  Objective:  Vitals:   04/06/20 1207 04/06/20 1658  BP: (!) 163/94 (!) 162/89  Pulse: 74 77  Resp: 16 16  Temp: 97.7 F (36.5 C) 98 F (36.7 C)  SpO2: 100% 99%     Intake/Output Summary (Last 24 hours) at 04/06/2020 1711 Last data filed at 04/06/2020 1511 Gross per 24 hour  Intake 1279.66 ml  Output 450 ml  Net 829.66 ml    Physical Exam Constitutional:      Comments: Patient sitting in the chair in no acute distress.  She is alone.   HENT:     Head: Normocephalic and atraumatic.     Mouth/Throat:     Pharynx: No oropharyngeal exudate.  Eyes:     Pupils: Pupils are equal, round, and reactive to light.  Cardiovascular:     Rate and Rhythm: Normal rate and regular rhythm.  Pulmonary:     Effort: Pulmonary effort is normal. No respiratory distress.     Breath sounds: Normal breath sounds. No wheezing.  Abdominal:     General: Bowel sounds are normal. There is no distension.     Palpations: Abdomen is soft. There is no mass.     Tenderness: There is no abdominal tenderness. There is no guarding or rebound.  Musculoskeletal:        General: No tenderness. Normal range of motion.     Cervical back: Normal range of motion and neck supple.     Comments: Left toes amputation.  Skin:    General: Skin is warm.  Neurological:     Mental Status: She is alert and oriented to person, place, and time.  Psychiatric:        Mood and Affect: Affect normal.      Labs:  Lab Results  Component Value Date   WBC 15.3 (H) 04/06/2020   HGB 8.1 (L) 04/06/2020   HCT 23.8 (L) 04/06/2020   MCV 89.1 04/06/2020   PLT 464 (H) 04/06/2020   NEUTROABS 23.2 (H) 03/29/2020    Lab Results  Component Value Date   NA 138 04/06/2020   K 4.4 04/06/2020   CL 110 04/06/2020   CO2 22 04/06/2020    Studies:   No results found.  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 [March 25, 2020]-shows progressive disease adrenals/bone/pleural parenchymal-consistent with recently rising tumor marker; discussed that we will plan to start chemotherapy in approximately 3 weeks.  Patient is very nervous regarding holding chemotherapy.  See below  #Left lower extremity infection-cellulitis/osteomyelitis status post amputation-on broad-spectrum antibiotics; patient status post [place] catheter-for IV vancomycin antibiotics until September 24th as per ID.   # Chronic kidney disease - stage III-GFR 35-stable discussed with Dr. Kolluru.   # Anemia- hemoglobin today-8-9/CKD-stable on PO iron.  #Covid vaccination: S/p first vaccination in the hospital.  She will need second dose in about 3 weeks.  Again reviewed the potential side effects of vaccination.  #Prognosis: I had a long discussion the patient that unfortunately she will eventually die of her progressive breast cancer sooner   or later whether we did the treatment or not.  However I am extremely concerned about offering chemotherapy prematurely while being treated for active infection.  Discussed my concerns that infection/sepsis sometimes might kill her sooner than the underlying progressive cancer.  She understands that we will take due diligence/clearance from podiatry and also ID before proceeding with chemotherapy.  We will have the patient follow-up in approximately 2 weeks; to reassess her status of infection; and plan chemotherapy/gemcitabine the following week or so.  # 35  minutes face-to-face with the patient discussing the above plan of  care; more than 50% of time spent on prognosis/ natural history; counseling and coordination.    Cammie Sickle, MD 04/06/2020  5:11 PM

## 2020-04-06 NOTE — Care Management Important Message (Addendum)
Important Message  Patient Details  Name: ADALI PENNINGS MRN: [id number] Date of Birth: 02-08-1956   Medicare Important Message Given:  Yes  Reviewed with patient via room phone due to isolation status.   Dannette Barbara 04/06/2020, 4:34 PM

## 2020-04-06 NOTE — Progress Notes (Signed)
PROGRESS NOTE    [name]  YBW:[id number] DOB: 12-11-1955 DOA: 03/29/2020 PCP: [name], MD    Assessment & Plan:   Principal Problem:   Sepsis ([place]) Active Problems:   Asthma   Benign essential HTN   Chronic kidney disease (CKD), stage V (HCC)   Chronic systolic CHF (congestive heart failure) (HCC)   Anxiety and depression   Type 2 diabetes mellitus with diabetic chronic kidney disease (HCC)   MI (mitral incompetence)   Carcinoma of upper-inner quadrant of left breast in female, estrogen receptor positive ([place])   Metastasis from malignant tumor of breast (HCC)   Anemia of chronic disease   AKI (acute kidney injury) ([place])   Hypertension complicating diabetes (HCC)   Lactic acidosis   Osteomyelitis (HCC)   Diabetic foot ulcer (HCC)   CKD (chronic kidney disease), stage III    [name] a 64 y.o. AAfemalewith medical history significant ofmetastatic breast cancer, diabetes mellitus, CKD stage III, CHF, presents in the emergency department with open draining left foot wound and associated fever.   Patient was admitted for sepsis secondary to infected left foot wound,  MRI consistent with osteomyelitis.  Podiatry consulted, Patient underwent fifth ray amputation,  tolerated well.  Patient is continued on IV antibiotics,   Podiatry is following , wound care needs to be arranged.  Infectious diseases consulted to to guide antibiotic regimen.  ID recommended continue vancomycin for known MRSA and Flagyl and cefepime.  Patient will need Hickman's catheter for long-term IV antibiotics given history of CKD stage III.  Vascular surgery consulted for Hickman catheter.   Sepsis secondary to infectedLeftfoot wound  Lactic acidosis: S/p fifth ray amputation JGO:TLXB [id number] lactic acid 2.1. Leukocytosis 25.5 Source foot infection. She received IV fluids in the ED, Lactic acid normalized. --Hickman cath 9/1 for continued IV abx --wound check  by podiatry today noted wound looked good PLAN: --continue vanc/Ancef/Flagyl for now --will transition to vanc/ceftriaxone and oral Flagyl at discharge, with stop date 04/27/20, per ID rec --follow up with ID in clinic week of 9/20  Leukocytosis --WBC trending up, despite being on broad-spectrum abx.  No fever. --Per ID, monitor for now  AKI on CKD stage IIIb:  Cr 2.74 on presentation.  Baseline creatinine around 2.1.  --Hold further MIVF --encourage oral hydration  Anemia of chronic disease: No obvious bleeding, hemoglobin is stable.  Hypertension: Continue home amlodipine, atenolol, clonidine  Diabetes mellitus Lantus 25u daily (reduced dose) SSI  Elevated LFTs:Could be due to metastatic lesion. Improved.  Metastatic breast cancer; She follows up outpatient with oncologist,  Hem / Oncology consulted.  Outpatient follow-up  Depression with anxiety: Continue home Prozac, and Xanax PRN   DVT prophylaxis: Lovenox SQ Code Status: Full code  Family Communication:  Status is: inpatient Dispo:   The patient is from: home Anticipated d/c is to: SNF Anticipated d/c date is: whenever bed available Patient currently is medically stable to d/c.   Subjective and Interval History:  Pt reported doing well.  Just eager to finish her IV abx and get home.     Objective: Vitals:   04/05/20 1950 04/06/20 0448 04/06/20 0810 04/06/20 1207  BP: (!) 153/88 (!) 157/82 (!) 172/96 (!) 163/94  Pulse: 73 76 75 74  Resp: 20 20 16 16   Temp: 97.8 F (36.6 C) 98.2 F (36.8 C)  97.7 F (36.5 C)  TempSrc: Oral Oral  Oral  SpO2: 100% 98% 98% 100%  Weight:      Height:  Intake/Output Summary (Last 24 hours) at 04/06/2020 1649 Last data filed at 04/06/2020 1511 Gross per 24 hour  Intake 1279.66 ml  Output 450 ml  Net 829.66 ml   Filed Weights   03/29/20 1119 04/04/20 1417  Weight: 88 kg 88.5 kg    Examination:   Constitutional: NAD, AAOx3 HEENT: conjunctivae and  lids normal, EOMI CV: No cyanosis.   RESP: normal respiratory effort, on RA Extremities: Left foot in wraps SKIN: warm, dry and intact Neuro: II - XII grossly intact.   Psych: Normal mood and affect.  Appropriate judgement and reason   Data Reviewed: I have personally reviewed following labs and imaging studies  CBC: Recent Labs  Lab 04/02/20 0710 04/03/20 0821 04/04/20 0505 04/05/20 0430 04/06/20 0454  WBC 11.5* 12.4* 13.9* 14.1* 15.3*  HGB 8.1* 8.8* 8.4* 7.8* 8.1*  HCT 24.5* 26.9* 25.4* 24.2* 23.8*  MCV 91.1 91.5 91.0 92.0 89.1  PLT 450* 504* 483* 475* 811*   Basic Metabolic Panel: Recent Labs  Lab 03/31/20 0530 04/01/20 0705 04/02/20 0710 04/03/20 0821 04/04/20 0505 04/05/20 0430 04/06/20 0454  NA 135   < > 140 137 139 140 138  K 5.1   < > 4.6 4.7 4.0 4.3 4.4  CL 106   < > 112* 109 110 112* 110  CO2 24   < > 21* 21* 21* 25 22  GLUCOSE 347*   < > 140* 217* 82 169* 151*  BUN 39*   < > 33* 30* 27* 23 23  CREATININE 2.43*   < > 2.10* 1.92* 1.90* 1.70* 1.75*  CALCIUM 8.0*   < > 8.3* 8.6* 8.7* 8.5* 8.6*  MG 2.1  --  2.2 2.1 2.1 2.0 2.1  PHOS 3.9  --  2.6 2.3* 2.9  --   --    < > = values in this interval not displayed.   GFR: Estimated Creatinine Clearance: 34 mL/min (A) (by C-G formula based on SCr of 1.75 mg/dL (H)). Liver Function Tests: No results for input(s): AST, ALT, ALKPHOS, BILITOT, PROT, ALBUMIN in the last 168 hours. No results for input(s): LIPASE, AMYLASE in the last 168 hours. No results for input(s): AMMONIA in the last 168 hours. Coagulation Profile: No results for input(s): INR, PROTIME in the last 168 hours. Cardiac Enzymes: No results for input(s): CKTOTAL, CKMB, CKMBINDEX, TROPONINI in the last 168 hours. BNP (last 3 results) No results for input(s): PROBNP in the last 8760 hours. HbA1C: Recent Labs    04/04/20 0505  HGBA1C 8.0*   CBG: Recent Labs  Lab 04/05/20 1124 04/05/20 1643 04/05/20 2052 04/06/20 0759 04/06/20 1207    GLUCAP 178* 180* 230* 151* 218*   Lipid Profile: No results for input(s): CHOL, HDL, LDLCALC, TRIG, CHOLHDL, LDLDIRECT in the last 72 hours. Thyroid Function Tests: No results for input(s): TSH, T4TOTAL, FREET4, T3FREE, THYROIDAB in the last 72 hours. Anemia Panel: No results for input(s): VITAMINB12, FOLATE, FERRITIN, TIBC, IRON, RETICCTPCT in the last 72 hours. Sepsis Labs: No results for input(s): PROCALCITON, LATICACIDVEN in the last 168 hours.  Recent Results (from the past 240 hour(s))  Blood culture (routine x 2)     Status: None   Collection Time: 03/29/20  3:51 PM   Specimen: BLOOD  Result Value Ref Range Status   Specimen Description BLOOD BLOOD RIGHT HAND  Final   Special Requests   Final    BOTTLES DRAWN AEROBIC AND ANAEROBIC Blood Culture adequate volume   Culture   Final  NO GROWTH 5 DAYS Performed at [facility], [address] [place], [place] [address]    Report Status 04/03/2020 FINAL  Final  Blood culture (routine x 2)     Status: None   Collection Time: 03/29/20  3:51 PM   Specimen: BLOOD  Result Value Ref Range Status   Specimen Description BLOOD LEFT ANTECUBITAL  Final   Special Requests   Final    BOTTLES DRAWN AEROBIC AND ANAEROBIC Blood Culture adequate volume   Culture   Final    NO GROWTH 5 DAYS Performed at [facility], [address], [place], [place] [address]    Report Status 04/03/2020 FINAL  Final  SARS Coronavirus 2 by RT PCR (hospital order, performed in [facility] hospital lab) Nasopharyngeal Nasopharyngeal Swab     Status: None   Collection Time: 03/29/20  5:15 PM   Specimen: Nasopharyngeal Swab  Result Value Ref Range Status   SARS Coronavirus 2 NEGATIVE NEGATIVE Final    Comment: (NOTE) SARS-CoV-2 target nucleic acids are NOT DETECTED.  The SARS-CoV-2 RNA is generally detectable in upper and lower respiratory specimens during the acute phase of infection. The lowest concentration of SARS-CoV-2 viral  copies this assay can detect is 250 copies / mL. A negative result does not preclude SARS-CoV-2 infection and should not be used as the sole basis for treatment or other patient management decisions.  A negative result may occur with improper specimen collection / handling, submission of specimen other than nasopharyngeal swab, presence of viral mutation(s) within the areas targeted by this assay, and inadequate number of viral copies (<250 copies / mL). A negative result must be combined with clinical observations, patient history, and epidemiological information.  Fact Sheet for Patients:   StrictlyIdeas.no  Fact Sheet for Healthcare Providers: BankingDealers.co.za  This test is not yet approved or  cleared by the [place] FDA and has been authorized for detection and/or diagnosis of SARS-CoV-2 by FDA under an Emergency Use Authorization (EUA).  This EUA will remain in effect (meaning this test can be used) for the duration of the COVID-19 declaration under Section 564(b)(1) of the Act, 21 U.S.C. section 360bbb-3(b)(1), unless the authorization is terminated or revoked sooner.  Performed at [facility], [address], [place], [place] [address]   Aerobic/Anaerobic Culture (surgical/deep wound)     Status: None   Collection Time: 03/30/20  1:19 PM   Specimen: PATH Other; Tissue  Result Value Ref Range Status   Specimen Description   Final    WOUND Performed at [facility], [address], [place], [place] [address]    Special Requests   Final    LEFT 5TH TOE JOINT INFECTION Performed at [facility], [place], [place] [address]    Gram Stain NO WBC SEEN RARE GRAM POSITIVE COCCI IN PAIRS   Final   Culture   Final    FEW ESCHERICHIA COLI NO ANAEROBES ISOLATED Performed at [facility], [place] [address], [place], [place] [address]    Report Status 04/04/2020 FINAL  Final    Organism ID, Bacteria ESCHERICHIA COLI  Final      Susceptibility   Escherichia coli - MIC*    AMPICILLIN >=32 RESISTANT Resistant     CEFAZOLIN <=4 SENSITIVE Sensitive     CEFEPIME <=0.12 SENSITIVE Sensitive     CEFTAZIDIME <=1 SENSITIVE Sensitive     CEFTRIAXONE <=0.25 SENSITIVE Sensitive     CIPROFLOXACIN <=0.25 SENSITIVE Sensitive  GENTAMICIN >=16 RESISTANT Resistant     IMIPENEM <=0.25 SENSITIVE Sensitive     TRIMETH/SULFA >=320 RESISTANT Resistant     AMPICILLIN/SULBACTAM >=32 RESISTANT Resistant     PIP/TAZO <=4 SENSITIVE Sensitive     * FEW ESCHERICHIA COLI  MRSA PCR Screening     Status: None   Collection Time: 04/02/20  8:11 AM   Specimen: Nasopharyngeal  Result Value Ref Range Status   MRSA by PCR NEGATIVE NEGATIVE Final    Comment:        The GeneXpert MRSA Assay (FDA approved for NASAL specimens only), is one component of a comprehensive MRSA colonization surveillance program. It is not intended to diagnose MRSA infection nor to guide or monitor treatment for MRSA infections. Performed at [facility], [address] [place], [place] [address]   SARS Coronavirus 2 by RT PCR (hospital order, performed in [facility] hospital lab) Nasopharyngeal Nasopharyngeal Swab     Status: None   Collection Time: 04/05/20  2:15 PM   Specimen: Nasopharyngeal Swab  Result Value Ref Range Status   SARS Coronavirus 2 NEGATIVE NEGATIVE Final    Comment: (NOTE) SARS-CoV-2 target nucleic acids are NOT DETECTED.  The SARS-CoV-2 RNA is generally detectable in upper and lower respiratory specimens during the acute phase of infection. The lowest concentration of SARS-CoV-2 viral copies this assay can detect is 250 copies / mL. A negative result does not preclude SARS-CoV-2 infection and should not be used as the sole basis for treatment or other patient management decisions.  A negative result may occur with improper specimen collection / handling, submission of  specimen other than nasopharyngeal swab, presence of viral mutation(s) within the areas targeted by this assay, and inadequate number of viral copies (<250 copies / mL). A negative result must be combined with clinical observations, patient history, and epidemiological information.  Fact Sheet for Patients:   StrictlyIdeas.no  Fact Sheet for Healthcare Providers: BankingDealers.co.za  This test is not yet approved or  cleared by the [place] FDA and has been authorized for detection and/or diagnosis of SARS-CoV-2 by FDA under an Emergency Use Authorization (EUA).  This EUA will remain in effect (meaning this test can be used) for the duration of the COVID-19 declaration under Section 564(b)(1) of the Act, 21 U.S.C. section 360bbb-3(b)(1), unless the authorization is terminated or revoked sooner.  Performed at [facility], [address], [place], [place] [address]       Radiology Studies: No results found.   Scheduled Meds: . amLODipine  10 mg Oral Daily  . aspirin EC  81 mg Oral Daily  . atenolol  100 mg Oral BID  . bumetanide  0.5 mg Oral BID  . calcitRIOL  0.25 mcg Oral Daily  . cloNIDine  0.2 mg Oral BID  . docusate sodium  100 mg Oral BID  . enalapril  20 mg Oral BID  . enoxaparin (LOVENOX) injection  40 mg Subcutaneous Q24H  . famotidine  20 mg Oral Daily  . feeding supplement (GLUCERNA SHAKE)  237 mL Oral BID BM  . ferrous sulfate  325 mg Oral BID WC  . FLUoxetine  20 mg Oral BID  . insulin aspart  0-5 Units Subcutaneous QHS  . insulin aspart  0-9 Units Subcutaneous TID WC  . insulin detemir  25 Units Subcutaneous Daily  . senna  1 tablet Oral BID  . simvastatin  20 mg Oral QPM   Continuous Infusions: . sodium chloride 10 mL/hr at 04/06/20  3903  .  ceFAZolin (ANCEF) IV 1 g (04/06/20 1449)  . metronidazole 500 mg (04/06/20 0823)  . vancomycin 1,500 mg (04/06/20 1228)     LOS: 8 days      Enzo Bi, MD [facility] If 7PM-7AM, please contact night-coverage 04/06/2020, 4:49 PM

## 2020-04-06 NOTE — Progress Notes (Signed)
   Covid-19 Vaccination Clinic  Name:  AYIANNA DARNOLD    MRN: [id number] DOB: 07-24-1956  04/06/2020  Ms. Bise was observed post Covid-19 immunization for 15 minutes without incident. She was provided with Vaccine Information Sheet and instruction to access the V-Safe system.   Ms. Ryer was instructed to call 911 with any severe reactions post vaccine: Marland Kitchen Difficulty breathing  . Swelling of face and throat  . A fast heartbeat  . A bad rash all over body  . Dizziness and weakness   Immunizations Administered    Name Date Dose VIS Date Route   Moderna COVID-19 Vaccine 04/06/2020 11:23 AM 0.5 mL 07/2019 Intramuscular   Manufacturer: Moderna   Lot: [id number]   [place]: [id number]

## 2020-04-06 NOTE — Progress Notes (Addendum)
Dressing changed.  Wound stable.  Open area in central aspect for 2ndary closure.  Appreciate ID input.  Dressing changes with betadine dressing changes daily.  Should maintain NWB to left foot.  F/U with podiatry in outpt clinic in 2-3 weeks.

## 2020-04-07 LAB — CBC
HCT: 26.9 % — ABNORMAL LOW (ref 36.0–46.0)
Hemoglobin: 8.9 g/dL — ABNORMAL LOW (ref 12.0–15.0)
MCH: 30.1 pg (ref 26.0–34.0)
MCHC: 33.1 g/dL (ref 30.0–36.0)
MCV: 90.9 fL (ref 80.0–100.0)
Platelets: 471 10*3/uL — ABNORMAL HIGH (ref 150–400)
RBC: 2.96 MIL/uL — ABNORMAL LOW (ref 3.87–5.11)
RDW: 17.3 % — ABNORMAL HIGH (ref 11.5–15.5)
WBC: 17.4 10*3/uL — ABNORMAL HIGH (ref 4.0–10.5)
nRBC: 0.1 % (ref 0.0–0.2)

## 2020-04-07 LAB — BASIC METABOLIC PANEL
Anion gap: 7 (ref 5–15)
BUN: 21 mg/dL (ref 8–23)
CO2: 20 mmol/L — ABNORMAL LOW (ref 22–32)
Calcium: 8.7 mg/dL — ABNORMAL LOW (ref 8.9–10.3)
Chloride: 108 mmol/L (ref 98–111)
Creatinine, Ser: 1.93 mg/dL — ABNORMAL HIGH (ref 0.44–1.00)
GFR calc Af Amer: 31 mL/min — ABNORMAL LOW (ref >60)
GFR calc non Af Amer: 27 mL/min — ABNORMAL LOW (ref >60)
Glucose, Bld: 245 mg/dL — ABNORMAL HIGH (ref 70–99)
Potassium: 4.6 mmol/L (ref 3.5–5.1)
Sodium: 135 mmol/L (ref 135–145)

## 2020-04-07 LAB — GLUCOSE, CAPILLARY
Glucose-Capillary: 118 mg/dL — ABNORMAL HIGH (ref 70–99)
Glucose-Capillary: 238 mg/dL — ABNORMAL HIGH (ref 70–99)
Glucose-Capillary: 312 mg/dL — ABNORMAL HIGH (ref 70–99)
Glucose-Capillary: 252 mg/dL — ABNORMAL HIGH (ref 70–99)

## 2020-04-07 LAB — VITAMIN B12: Vitamin B-12: 2871 pg/mL — ABNORMAL HIGH (ref 180–914)

## 2020-04-07 LAB — MAGNESIUM: Magnesium: 2.1 mg/dL (ref 1.7–2.4)

## 2020-04-07 NOTE — Progress Notes (Signed)
PROGRESS NOTE    [name]  IOE:[id number] DOB: 09/28/1955 DOA: 03/29/2020 PCP: [name], MD    Assessment & Plan:   Principal Problem:   Sepsis ([place]) Active Problems:   Asthma   Benign essential HTN   Chronic kidney disease (CKD), stage V (HCC)   Chronic systolic CHF (congestive heart failure) (HCC)   Anxiety and depression   Type 2 diabetes mellitus with diabetic chronic kidney disease (HCC)   MI (mitral incompetence)   Carcinoma of upper-inner quadrant of left breast in female, estrogen receptor positive ([place])   Metastasis from malignant tumor of breast (HCC)   Anemia of chronic disease   AKI (acute kidney injury) ([place])   Hypertension complicating diabetes (HCC)   Lactic acidosis   Osteomyelitis (HCC)   Diabetic foot ulcer (HCC)   CKD (chronic kidney disease), stage III    Gloria A Mebaneis a 64 y.o. AAfemalewith medical history significant ofmetastatic breast cancer, diabetes mellitus, CKD stage III, CHF, presents in the emergency department with open draining left foot wound and associated fever.   Patient was admitted for sepsis secondary to infected left foot wound,  MRI consistent with osteomyelitis.  Podiatry consulted, Patient underwent fifth ray amputation,  tolerated well.  Patient is continued on IV antibiotics,   Podiatry is following , wound care needs to be arranged.  Infectious diseases consulted to to guide antibiotic regimen.  ID recommended continue vancomycin for known MRSA and Flagyl and cefepime.  Patient will need Hickman's catheter for long-term IV antibiotics given history of CKD stage III.  Vascular surgery consulted for Hickman catheter.   Sepsis secondary to infectedLeftfoot wound  Lactic acidosis: S/p fifth ray amputation HWE:[id number] lactic acid 2.1. Leukocytosis 25.5 Source foot infection. She received IV fluids in the ED, Lactic acid normalized. --Hickman cath 9/1 for continued IV abx --wound check  by podiatry today noted wound looked good PLAN: --continue vanc/Ancef/Flagyl for now --will transition to vanc/ceftriaxone and oral Flagyl at discharge, with stop date 04/27/20, per ID rec --follow up with ID in clinic week of 9/20  Leukocytosis --WBC trending up, despite being on broad-spectrum abx.  No fever. --Per ID, monitor for now  AKI on CKD stage IIIb:  Cr 2.74 on presentation.  Baseline creatinine around 2.1.  --Hold further MIVF --encourage oral hydration  Anemia of chronic disease: No obvious bleeding, hemoglobin is stable. --iron and Vit B12 levels not low, but folate def --start folate supplement  Hypertension: Continue home amlodipine, atenolol, clonidine and enalapril  Diabetes mellitus Lantus 25u daily (reduced dose) SSI  Elevated LFTs:Could be due to metastatic lesion. Improved.  Metastatic breast cancer; She follows up outpatient with oncologist,  Hem / Oncology consulted.  Outpatient follow-up  Depression with anxiety: Continue home Prozac, and Xanax PRN   DVT prophylaxis: Lovenox SQ Code Status: Full code  Family Communication:  Status is: inpatient Dispo:   The patient is from: home Anticipated d/c is to: SNF Anticipated d/c date is: whenever bed available Patient currently is medically stable to d/c.   Subjective and Interval History:  Doesn't like the food, but otherwise no complaints.     Objective: Vitals:   04/06/20 2027 04/07/20 0638 04/07/20 1030 04/07/20 1200  BP: (!) 180/93 (!) 171/91 (!) 182/98 (!) 166/83  Pulse: 78 74 77 80  Resp: 16  18 16   Temp: 97.8 F (36.6 C) 98.4 F (36.9 C) 98.3 F (36.8 C) 97.9 F (36.6 C)  TempSrc: Oral Oral Oral Oral  SpO2: 99%  98% 97% 99%  Weight:      Height:        Intake/Output Summary (Last 24 hours) at 04/07/2020 1453 Last data filed at 04/07/2020 0617 Gross per 24 hour  Intake 1078.7 ml  Output 800 ml  Net 278.7 ml   Filed Weights   03/29/20 1119 04/04/20 1417  Weight:  88 kg 88.5 kg    Examination:   Constitutional: NAD, AAOx3 HEENT: conjunctivae and lids normal, EOMI CV: No cyanosis.   RESP: normal respiratory effort, on RA Extremities: edema in BLE SKIN: warm, dry and intact Neuro: II - XII grossly intact.   Psych: Normal mood and affect.  Appropriate judgement and reason    Data Reviewed: I have personally reviewed following labs and imaging studies  CBC: Recent Labs  Lab 04/03/20 0821 04/04/20 0505 04/05/20 0430 04/06/20 0454 04/07/20 1102  WBC 12.4* 13.9* 14.1* 15.3* 17.4*  HGB 8.8* 8.4* 7.8* 8.1* 8.9*  HCT 26.9* 25.4* 24.2* 23.8* 26.9*  MCV 91.5 91.0 92.0 89.1 90.9  PLT 504* 483* 475* 464* 884*   Basic Metabolic Panel: Recent Labs  Lab 04/02/20 0710 04/02/20 0710 04/03/20 0821 04/04/20 0505 04/05/20 0430 04/06/20 0454 04/07/20 1102  NA 140   < > 137 139 140 138 135  K 4.6   < > 4.7 4.0 4.3 4.4 4.6  CL 112*   < > 109 110 112* 110 108  CO2 21*   < > 21* 21* 25 22 20*  GLUCOSE 140*   < > 217* 82 169* 151* 245*  BUN 33*   < > 30* 27* 23 23 21   CREATININE 2.10*   < > 1.92* 1.90* 1.70* 1.75* 1.93*  CALCIUM 8.3*   < > 8.6* 8.7* 8.5* 8.6* 8.7*  MG 2.2   < > 2.1 2.1 2.0 2.1 2.1  PHOS 2.6  --  2.3* 2.9  --   --   --    < > = values in this interval not displayed.   GFR: Estimated Creatinine Clearance: 30.9 mL/min (A) (by C-G formula based on SCr of 1.93 mg/dL (H)). Liver Function Tests: No results for input(s): AST, ALT, ALKPHOS, BILITOT, PROT, ALBUMIN in the last 168 hours. No results for input(s): LIPASE, AMYLASE in the last 168 hours. No results for input(s): AMMONIA in the last 168 hours. Coagulation Profile: No results for input(s): INR, PROTIME in the last 168 hours. Cardiac Enzymes: No results for input(s): CKTOTAL, CKMB, CKMBINDEX, TROPONINI in the last 168 hours. BNP (last 3 results) No results for input(s): PROBNP in the last 8760 hours. HbA1C: No results for input(s): HGBA1C in the last 72  hours. CBG: Recent Labs  Lab 04/06/20 1207 04/06/20 1657 04/06/20 2130 04/07/20 0810 04/07/20 1157  GLUCAP 218* 214* 162* 118* 238*   Lipid Profile: No results for input(s): CHOL, HDL, LDLCALC, TRIG, CHOLHDL, LDLDIRECT in the last 72 hours. Thyroid Function Tests: No results for input(s): TSH, T4TOTAL, FREET4, T3FREE, THYROIDAB in the last 72 hours. Anemia Panel: Recent Labs    04/06/20 0454  FOLATE 4.9*  TIBC 162*  IRON 49   Sepsis Labs: No results for input(s): PROCALCITON, LATICACIDVEN in the last 168 hours.  Recent Results (from the past 240 hour(s))  Blood culture (routine x 2)     Status: None   Collection Time: 03/29/20  3:51 PM   Specimen: BLOOD  Result Value Ref Range Status   Specimen Description BLOOD BLOOD RIGHT HAND  Final   Special Requests   Final  BOTTLES DRAWN AEROBIC AND ANAEROBIC Blood Culture adequate volume   Culture   Final    NO GROWTH 5 DAYS Performed at [facility], [address] [place], [place] [address]    Report Status 04/03/2020 FINAL  Final  Blood culture (routine x 2)     Status: None   Collection Time: 03/29/20  3:51 PM   Specimen: BLOOD  Result Value Ref Range Status   Specimen Description BLOOD LEFT ANTECUBITAL  Final   Special Requests   Final    BOTTLES DRAWN AEROBIC AND ANAEROBIC Blood Culture adequate volume   Culture   Final    NO GROWTH 5 DAYS Performed at [facility], [address] [place], [place] [address]    Report Status 04/03/2020 FINAL  Final  SARS Coronavirus 2 by RT PCR (hospital order, performed in [facility] hospital lab) Nasopharyngeal Nasopharyngeal Swab     Status: None   Collection Time: 03/29/20  5:15 PM   Specimen: Nasopharyngeal Swab  Result Value Ref Range Status   SARS Coronavirus 2 NEGATIVE NEGATIVE Final    Comment: (NOTE) SARS-CoV-2 target nucleic acids are NOT DETECTED.  The SARS-CoV-2 RNA is generally detectable in upper and lower respiratory specimens during the  acute phase of infection. The lowest concentration of SARS-CoV-2 viral copies this assay can detect is 250 copies / mL. A negative result does not preclude SARS-CoV-2 infection and should not be used as the sole basis for treatment or other patient management decisions.  A negative result may occur with improper specimen collection / handling, submission of specimen other than nasopharyngeal swab, presence of viral mutation(s) within the areas targeted by this assay, and inadequate number of viral copies (<250 copies / mL). A negative result must be combined with clinical observations, patient history, and epidemiological information.  Fact Sheet for Patients:   StrictlyIdeas.no  Fact Sheet for Healthcare Providers: BankingDealers.co.za  This test is not yet approved or  cleared by the [place] FDA and has been authorized for detection and/or diagnosis of SARS-CoV-2 by FDA under an Emergency Use Authorization (EUA).  This EUA will remain in effect (meaning this test can be used) for the duration of the COVID-19 declaration under Section 564(b)(1) of the Act, 21 U.S.C. section 360bbb-3(b)(1), unless the authorization is terminated or revoked sooner.  Performed at [facility], [address] [place], [place] [address]   Aerobic/Anaerobic Culture (surgical/deep wound)     Status: None   Collection Time: 03/30/20  1:19 PM   Specimen: PATH Other; Tissue  Result Value Ref Range Status   Specimen Description   Final    WOUND Performed at [facility], [address], [place], [place] [address]    Special Requests   Final    LEFT 5TH TOE JOINT INFECTION Performed at [facility], [place], [place] [address]    Gram Stain NO WBC SEEN RARE GRAM POSITIVE COCCI IN PAIRS   Final   Culture   Final    FEW ESCHERICHIA COLI NO ANAEROBES ISOLATED Performed at [facility], [place]  [address] [place], [place] [address]    Report Status 04/04/2020 FINAL  Final   Organism ID, Bacteria ESCHERICHIA COLI  Final      Susceptibility   Escherichia coli - MIC*    AMPICILLIN >=32 RESISTANT Resistant     CEFAZOLIN <=4 SENSITIVE Sensitive     CEFEPIME <=0.12 SENSITIVE Sensitive     CEFTAZIDIME <=1 SENSITIVE  Sensitive     CEFTRIAXONE <=0.25 SENSITIVE Sensitive     CIPROFLOXACIN <=0.25 SENSITIVE Sensitive     GENTAMICIN >=16 RESISTANT Resistant     IMIPENEM <=0.25 SENSITIVE Sensitive     TRIMETH/SULFA >=320 RESISTANT Resistant     AMPICILLIN/SULBACTAM >=32 RESISTANT Resistant     PIP/TAZO <=4 SENSITIVE Sensitive     * FEW ESCHERICHIA COLI  MRSA PCR Screening     Status: None   Collection Time: 04/02/20  8:11 AM   Specimen: Nasopharyngeal  Result Value Ref Range Status   MRSA by PCR NEGATIVE NEGATIVE Final    Comment:        The GeneXpert MRSA Assay (FDA approved for NASAL specimens only), is one component of a comprehensive MRSA colonization surveillance program. It is not intended to diagnose MRSA infection nor to guide or monitor treatment for MRSA infections. Performed at [facility], [address] [place], [place] [address]   SARS Coronavirus 2 by RT PCR (hospital order, performed in [facility] hospital lab) Nasopharyngeal Nasopharyngeal Swab     Status: None   Collection Time: 04/05/20  2:15 PM   Specimen: Nasopharyngeal Swab  Result Value Ref Range Status   SARS Coronavirus 2 NEGATIVE NEGATIVE Final    Comment: (NOTE) SARS-CoV-2 target nucleic acids are NOT DETECTED.  The SARS-CoV-2 RNA is generally detectable in upper and lower respiratory specimens during the acute phase of infection. The lowest concentration of SARS-CoV-2 viral copies this assay can detect is 250 copies / mL. A negative result does not preclude SARS-CoV-2 infection and should not be used as the sole basis for treatment or other patient management decisions.  A negative result  may occur with improper specimen collection / handling, submission of specimen other than nasopharyngeal swab, presence of viral mutation(s) within the areas targeted by this assay, and inadequate number of viral copies (<250 copies / mL). A negative result must be combined with clinical observations, patient history, and epidemiological information.  Fact Sheet for Patients:   StrictlyIdeas.no  Fact Sheet for Healthcare Providers: BankingDealers.co.za  This test is not yet approved or  cleared by the [place] FDA and has been authorized for detection and/or diagnosis of SARS-CoV-2 by FDA under an Emergency Use Authorization (EUA).  This EUA will remain in effect (meaning this test can be used) for the duration of the COVID-19 declaration under Section 564(b)(1) of the Act, 21 U.S.C. section 360bbb-3(b)(1), unless the authorization is terminated or revoked sooner.  Performed at [facility], [address], [place], [place] [address]       Radiology Studies: No results found.   Scheduled Meds: . amLODipine  10 mg Oral Daily  . aspirin EC  81 mg Oral Daily  . atenolol  100 mg Oral BID  . bumetanide  0.5 mg Oral BID  . calcitRIOL  0.25 mcg Oral Daily  . cloNIDine  0.2 mg Oral BID  . docusate sodium  100 mg Oral BID  . enalapril  20 mg Oral BID  . enoxaparin (LOVENOX) injection  40 mg Subcutaneous Q24H  . famotidine  20 mg Oral Daily  . feeding supplement (GLUCERNA SHAKE)  237 mL Oral BID BM  . ferrous sulfate  325 mg Oral BID WC  . FLUoxetine  20 mg Oral BID  . insulin aspart  0-5 Units Subcutaneous QHS  . insulin aspart  0-9 Units Subcutaneous TID WC  . insulin detemir  25 Units Subcutaneous Daily  . senna  1 tablet  Oral BID  . simvastatin  20 mg Oral QPM   Continuous Infusions: . sodium chloride 10 mL/hr at 04/07/20 1226  .  ceFAZolin (ANCEF) IV 1 g (04/07/20 1451)  . metronidazole 500 mg (04/07/20  1250)  . vancomycin 1,500 mg (04/06/20 1228)     LOS: 9 days     Enzo Bi, MD [facility] If 7PM-7AM, please contact night-coverage 04/07/2020, 2:53 PM

## 2020-04-08 LAB — MAGNESIUM: Magnesium: 2.1 mg/dL (ref 1.7–2.4)

## 2020-04-08 LAB — CBC
HCT: 26.3 % — ABNORMAL LOW (ref 36.0–46.0)
Hemoglobin: 8.8 g/dL — ABNORMAL LOW (ref 12.0–15.0)
MCH: 30.3 pg (ref 26.0–34.0)
MCHC: 33.5 g/dL (ref 30.0–36.0)
MCV: 90.7 fL (ref 80.0–100.0)
Platelets: 438 10*3/uL — ABNORMAL HIGH (ref 150–400)
RBC: 2.9 MIL/uL — ABNORMAL LOW (ref 3.87–5.11)
RDW: 17.3 % — ABNORMAL HIGH (ref 11.5–15.5)
WBC: 12.6 10*3/uL — ABNORMAL HIGH (ref 4.0–10.5)
nRBC: 0 % (ref 0.0–0.2)

## 2020-04-08 LAB — GLUCOSE, CAPILLARY
Glucose-Capillary: 123 mg/dL — ABNORMAL HIGH (ref 70–99)
Glucose-Capillary: 302 mg/dL — ABNORMAL HIGH (ref 70–99)
Glucose-Capillary: 260 mg/dL — ABNORMAL HIGH (ref 70–99)
Glucose-Capillary: 181 mg/dL — ABNORMAL HIGH (ref 70–99)

## 2020-04-08 LAB — BASIC METABOLIC PANEL
Anion gap: 8 (ref 5–15)
BUN: 26 mg/dL — ABNORMAL HIGH (ref 8–23)
CO2: 22 mmol/L (ref 22–32)
Calcium: 8.7 mg/dL — ABNORMAL LOW (ref 8.9–10.3)
Chloride: 107 mmol/L (ref 98–111)
Creatinine, Ser: 2.14 mg/dL — ABNORMAL HIGH (ref 0.44–1.00)
GFR calc Af Amer: 28 mL/min — ABNORMAL LOW (ref >60)
GFR calc non Af Amer: 24 mL/min — ABNORMAL LOW (ref >60)
Glucose, Bld: 122 mg/dL — ABNORMAL HIGH (ref 70–99)
Potassium: 4.6 mmol/L (ref 3.5–5.1)
Sodium: 137 mmol/L (ref 135–145)

## 2020-04-08 MED ORDER — FOLIC ACID 1 MG PO TABS
1.0000 mg | ORAL_TABLET | Freq: Every day | ORAL | Status: DC
  Administered 2020-04-08 – 2020-04-09 (×2): 1 mg via ORAL
  Filled 2020-04-08 (×2): qty 1

## 2020-04-08 MED ORDER — HYDRALAZINE HCL 50 MG PO TABS: 50.0000 mg | ORAL_TABLET | Freq: Three times a day (TID) | ORAL | Status: DC

## 2020-04-08 MED ORDER — HYDRALAZINE HCL 50 MG PO TABS
50.0000 mg | ORAL_TABLET | Freq: Three times a day (TID) | ORAL | Status: DC
  Administered 2020-04-08 – 2020-04-09 (×5): 50 mg via ORAL
  Filled 2020-04-08 (×5): qty 1

## 2020-04-08 MED ORDER — CEFAZOLIN SODIUM-DEXTROSE 1-4 GM/50ML-% IV SOLN
1.0000 g | Freq: Two times a day (BID) | INTRAVENOUS | Status: DC
  Administered 2020-04-08 – 2020-04-09 (×2): 1 g via INTRAVENOUS
  Filled 2020-04-08 (×3): qty 50

## 2020-04-08 MED ORDER — ENOXAPARIN SODIUM 30 MG/0.3ML ~~LOC~~ SOLN
30.0000 mg | SUBCUTANEOUS | Status: DC
  Administered 2020-04-08: 30 mg via SUBCUTANEOUS
  Filled 2020-04-08: qty 0.3

## 2020-04-08 NOTE — Progress Notes (Signed)
PHARMACIST - PHYSICIAN COMMUNICATION  CONCERNING:  Enoxaparin (Lovenox) for DVT Prophylaxis    RECOMMENDATION: Patient was prescribed enoxaprin 40mg  q24 hours for VTE prophylaxis.   Filed Weights   03/29/20 1119 04/04/20 1417  Weight: 88 kg (194 lb) 88.5 kg (195 lb)    Body mass index is 35.67 kg/m.  Estimated Creatinine Clearance: 27.8 mL/min (A) (by C-G formula based on SCr of 2.14 mg/dL (H)).   Patient is candidate for enoxaparin 30mg  every 24 hours based on CrCl <38ml/min or Weight <45kg  DESCRIPTION: Pharmacy has adjusted enoxaparin dose per [facility] policy.  Patient is now receiving enoxaparin 30 mg every 24 hours    Dallie Piles, PharmD Clinical Pharmacist  04/08/2020 11:10 AM

## 2020-04-08 NOTE — Progress Notes (Addendum)
PROGRESS NOTE    [name]  WJX:[id number] DOB: [birth year] DOA: 03/29/2020 PCP: [name], MD    Assessment & Plan:   Principal Problem:   Sepsis ([place]) Active Problems:   Asthma   Benign essential HTN   Chronic kidney disease (CKD), stage V (HCC)   Chronic systolic CHF (congestive heart failure) (HCC)   Anxiety and depression   Type 2 diabetes mellitus with diabetic chronic kidney disease (HCC)   MI (mitral incompetence)   Carcinoma of upper-inner quadrant of left breast in female, estrogen receptor positive ([place])   Metastasis from malignant tumor of breast (HCC)   Anemia of chronic disease   AKI (acute kidney injury) ([place])   Hypertension complicating diabetes (HCC)   Lactic acidosis   Osteomyelitis (HCC)   Diabetic foot ulcer (HCC)   CKD (chronic kidney disease), stage III    Gloria A Mebaneis a 64 y.o. AAfemalewith medical history significant ofmetastatic breast cancer, diabetes mellitus, CKD stage III, CHF, presents in the emergency department with open draining left foot wound and associated fever.   Patient was admitted for sepsis secondary to infected left foot wound,  MRI consistent with osteomyelitis.  Podiatry consulted, Patient underwent fifth ray amputation,  tolerated well.  Patient is continued on IV antibiotics,   Podiatry is following , wound care needs to be arranged.  Infectious diseases consulted to to guide antibiotic regimen.  ID recommended continue vancomycin for known MRSA and Flagyl and cefepime.  Patient will need Hickman's catheter for long-term IV antibiotics given history of CKD stage III.  Vascular surgery consulted for Hickman catheter.   Sepsis secondary to infectedLeftfoot wound  Lactic acidosis: S/p fifth ray amputation OZH:YQMV [id number]/24 lactic acid 2.1. Leukocytosis 25.5 Source foot infection. She received IV fluids in the ED, Lactic acid normalized. --Hickman cath 9/1 for continued IV abx --wound check  by podiatry today noted wound looked good PLAN: --continue vanc/Ancef/Flagyl for now --will transition to vanc/ceftriaxone and oral Flagyl at discharge, with stop date 04/27/20, per ID rec --follow up with ID in clinic week of 9/20 --pt requests boot for her left foot  Leukocytosis --WBC trending up, despite being on broad-spectrum abx.  No fever. --WBC finally started to trend down on 9/5 --continue to monitor WBC  AKI on CKD stage IIIb:  Cr 2.74 on presentation.  Baseline creatinine around 2.1.  --Hold further MIVF --encourage oral hydration  Anemia of chronic disease: No obvious bleeding, hemoglobin is stable. --iron and Vit B12 levels not low, but folate def --continue folate supplement  Hypertension: --BP has been elevated, but pt prefers not to change her home BP regimen. --Continue full home regimen of amlodipine, atenolol, clonidine and enalapril  Diabetes mellitus Lantus 25u daily (reduced dose) SSI  Elevated LFTs:Could be due to metastatic lesion. Improved.  Metastatic breast cancer; She follows up outpatient with oncologist,  Hem / Oncology consulted.  Outpatient follow-up  Depression with anxiety: Continue home Prozac, and Xanax PRN   DVT prophylaxis: Lovenox SQ Code Status: Full code  Family Communication:  Status is: inpatient Dispo:   The patient is from: home Anticipated d/c is to: SNF Anticipated d/c date is: whenever bed available Patient currently is medically stable to d/c.   Subjective and Interval History:  Pt is trying to eat better.  No complaints.   BP has been elevated, but pt doesn't want her BP regimen changed.   Objective: Vitals:   04/08/20 0837 04/08/20 1048 04/08/20 1224 04/08/20 1548  BP: (!) 175/93 Marland Kitchen)  162/82 (!) 141/79 (!) 152/77  Pulse: 72 76 74 76  Resp:   16   Temp:   98 F (36.7 C) 97.8 F (36.6 C)  TempSrc:   Oral Oral  SpO2:   99%   Weight:      Height:        Intake/Output Summary (Last 24 hours)  at 04/08/2020 1611 Last data filed at 04/08/2020 0600 Gross per 24 hour  Intake 0 ml  Output 800 ml  Net -800 ml   Filed Weights   03/29/20 1119 04/04/20 1417  Weight: 88 kg 88.5 kg    Examination:   Constitutional: NAD, AAOx3 HEENT: conjunctivae and lids normal, EOMI CV: No cyanosis.   RESP: normal respiratory effort, on RA Extremities: swelling in BLE.  Left foot in ACE wrap. SKIN: warm, dry and intact Neuro: II - XII grossly intact.   Psych: Normal mood and affect.  Appropriate judgement and reason   Data Reviewed: I have personally reviewed following labs and imaging studies  CBC: Recent Labs  Lab 04/04/20 0505 04/05/20 0430 04/06/20 0454 04/07/20 1102 04/08/20 0757  WBC 13.9* 14.1* 15.3* 17.4* 12.6*  HGB 8.4* 7.8* 8.1* 8.9* 8.8*  HCT 25.4* 24.2* 23.8* 26.9* 26.3*  MCV 91.0 92.0 89.1 90.9 90.7  PLT 483* 475* 464* 471* 154*   Basic Metabolic Panel: Recent Labs  Lab 04/02/20 0710 04/02/20 0710 04/03/20 0821 04/03/20 0821 04/04/20 0505 04/05/20 0430 04/06/20 0454 04/07/20 1102 04/08/20 0757  NA 140   < > 137   < > 139 140 138 135 137  K 4.6   < > 4.7   < > 4.0 4.3 4.4 4.6 4.6  CL 112*   < > 109   < > 110 112* 110 108 107  CO2 21*   < > 21*   < > 21* 25 22 20* 22  GLUCOSE 140*   < > 217*   < > 82 169* 151* 245* 122*  BUN 33*   < > 30*   < > 27* 23 23 21  26*  CREATININE 2.10*   < > 1.92*   < > 1.90* 1.70* 1.75* 1.93* 2.14*  CALCIUM 8.3*   < > 8.6*   < > 8.7* 8.5* 8.6* 8.7* 8.7*  MG 2.2   < > 2.1   < > 2.1 2.0 2.1 2.1 2.1  PHOS 2.6  --  2.3*  --  2.9  --   --   --   --    < > = values in this interval not displayed.   GFR: Estimated Creatinine Clearance: 27.8 mL/min (A) (by C-G formula based on SCr of 2.14 mg/dL (H)). Liver Function Tests: No results for input(s): AST, ALT, ALKPHOS, BILITOT, PROT, ALBUMIN in the last 168 hours. No results for input(s): LIPASE, AMYLASE in the last 168 hours. No results for input(s): AMMONIA in the last 168  hours. Coagulation Profile: No results for input(s): INR, PROTIME in the last 168 hours. Cardiac Enzymes: No results for input(s): CKTOTAL, CKMB, CKMBINDEX, TROPONINI in the last 168 hours. BNP (last 3 results) No results for input(s): PROBNP in the last 8760 hours. HbA1C: No results for input(s): HGBA1C in the last 72 hours. CBG: Recent Labs  Lab 04/07/20 1157 04/07/20 1656 04/07/20 2108 04/08/20 0806 04/08/20 1225  GLUCAP 238* 312* 252* 123* 302*   Lipid Profile: No results for input(s): CHOL, HDL, LDLCALC, TRIG, CHOLHDL, LDLDIRECT in the last 72 hours. Thyroid Function Tests: No results for input(s):  TSH, T4TOTAL, FREET4, T3FREE, THYROIDAB in the last 72 hours. Anemia Panel: Recent Labs    04/06/20 0454 04/07/20 1102  VITAMINB12  --  2,871*  FOLATE 4.9*  --   TIBC 162*  --   IRON 49  --    Sepsis Labs: No results for input(s): PROCALCITON, LATICACIDVEN in the last 168 hours.  Recent Results (from the past 240 hour(s))  SARS Coronavirus 2 by RT PCR (hospital order, performed in [facility] hospital lab) Nasopharyngeal Nasopharyngeal Swab     Status: None   Collection Time: 03/29/20  5:15 PM   Specimen: Nasopharyngeal Swab  Result Value Ref Range Status   SARS Coronavirus 2 NEGATIVE NEGATIVE Final    Comment: (NOTE) SARS-CoV-2 target nucleic acids are NOT DETECTED.  The SARS-CoV-2 RNA is generally detectable in upper and lower respiratory specimens during the acute phase of infection. The lowest concentration of SARS-CoV-2 viral copies this assay can detect is 250 copies / mL. A negative result does not preclude SARS-CoV-2 infection and should not be used as the sole basis for treatment or other patient management decisions.  A negative result may occur with improper specimen collection / handling, submission of specimen other than nasopharyngeal swab, presence of viral mutation(s) within the areas targeted by this assay, and inadequate number of viral  copies (<250 copies / mL). A negative result must be combined with clinical observations, patient history, and epidemiological information.  Fact Sheet for Patients:   StrictlyIdeas.no  Fact Sheet for Healthcare Providers: BankingDealers.co.za  This test is not yet approved or  cleared by the [place] FDA and has been authorized for detection and/or diagnosis of SARS-CoV-2 by FDA under an Emergency Use Authorization (EUA).  This EUA will remain in effect (meaning this test can be used) for the duration of the COVID-19 declaration under Section 564(b)(1) of the Act, 21 U.S.C. section 360bbb-3(b)(1), unless the authorization is terminated or revoked sooner.  Performed at [facility], [address], [place], [place] [address]   Aerobic/Anaerobic Culture (surgical/deep wound)     Status: None   Collection Time: 03/30/20  1:19 PM   Specimen: PATH Other; Tissue  Result Value Ref Range Status   Specimen Description   Final    WOUND Performed at [facility], [address], [place], [place] [address]    Special Requests   Final    LEFT 5TH TOE JOINT INFECTION Performed at [facility], [place], [place] [address]    Gram Stain NO WBC SEEN RARE GRAM POSITIVE COCCI IN PAIRS   Final   Culture   Final    FEW ESCHERICHIA COLI NO ANAEROBES ISOLATED Performed at [facility], [place] [address], [place], [place] [address]    Report Status 04/04/2020 FINAL  Final   Organism ID, Bacteria ESCHERICHIA COLI  Final      Susceptibility   Escherichia coli - MIC*    AMPICILLIN >=32 RESISTANT Resistant     CEFAZOLIN <=4 SENSITIVE Sensitive     CEFEPIME <=0.12 SENSITIVE Sensitive     CEFTAZIDIME <=1 SENSITIVE Sensitive     CEFTRIAXONE <=0.25 SENSITIVE Sensitive     CIPROFLOXACIN <=0.25 SENSITIVE Sensitive     GENTAMICIN >=16 RESISTANT Resistant     IMIPENEM <=0.25 SENSITIVE Sensitive      TRIMETH/SULFA >=320 RESISTANT Resistant     AMPICILLIN/SULBACTAM >=32 RESISTANT Resistant     PIP/TAZO <=4 SENSITIVE Sensitive     * FEW ESCHERICHIA COLI  MRSA  PCR Screening     Status: None   Collection Time: 04/02/20  8:11 AM   Specimen: Nasopharyngeal  Result Value Ref Range Status   MRSA by PCR NEGATIVE NEGATIVE Final    Comment:        The GeneXpert MRSA Assay (FDA approved for NASAL specimens only), is one component of a comprehensive MRSA colonization surveillance program. It is not intended to diagnose MRSA infection nor to guide or monitor treatment for MRSA infections. Performed at [facility], [address] [place], [place] [address]   SARS Coronavirus 2 by RT PCR (hospital order, performed in [facility] hospital lab) Nasopharyngeal Nasopharyngeal Swab     Status: None   Collection Time: 04/05/20  2:15 PM   Specimen: Nasopharyngeal Swab  Result Value Ref Range Status   SARS Coronavirus 2 NEGATIVE NEGATIVE Final    Comment: (NOTE) SARS-CoV-2 target nucleic acids are NOT DETECTED.  The SARS-CoV-2 RNA is generally detectable in upper and lower respiratory specimens during the acute phase of infection. The lowest concentration of SARS-CoV-2 viral copies this assay can detect is 250 copies / mL. A negative result does not preclude SARS-CoV-2 infection and should not be used as the sole basis for treatment or other patient management decisions.  A negative result may occur with improper specimen collection / handling, submission of specimen other than nasopharyngeal swab, presence of viral mutation(s) within the areas targeted by this assay, and inadequate number of viral copies (<250 copies / mL). A negative result must be combined with clinical observations, patient history, and epidemiological information.  Fact Sheet for Patients:   StrictlyIdeas.no  Fact Sheet for Healthcare  Providers: BankingDealers.co.za  This test is not yet approved or  cleared by the [place] FDA and has been authorized for detection and/or diagnosis of SARS-CoV-2 by FDA under an Emergency Use Authorization (EUA).  This EUA will remain in effect (meaning this test can be used) for the duration of the COVID-19 declaration under Section 564(b)(1) of the Act, 21 U.S.C. section 360bbb-3(b)(1), unless the authorization is terminated or revoked sooner.  Performed at [facility], [address], [place], [place] [address]       Radiology Studies: No results found.   Scheduled Meds:  amLODipine  10 mg Oral Daily   aspirin EC  81 mg Oral Daily   atenolol  100 mg Oral BID   bumetanide  0.5 mg Oral BID   calcitRIOL  0.25 mcg Oral Daily   cloNIDine  0.2 mg Oral BID   docusate sodium  100 mg Oral BID   enalapril  20 mg Oral BID   enoxaparin (LOVENOX) injection  30 mg Subcutaneous Q24H   famotidine  20 mg Oral Daily   feeding supplement (GLUCERNA SHAKE)  237 mL Oral BID BM   ferrous sulfate  325 mg Oral BID WC   FLUoxetine  20 mg Oral BID   folic acid  1 mg Oral Daily   hydrALAZINE  50 mg Oral Q8H   insulin aspart  0-5 Units Subcutaneous QHS   insulin aspart  0-9 Units Subcutaneous TID WC   insulin detemir  25 Units Subcutaneous Daily   senna  1 tablet Oral BID   simvastatin  20 mg Oral QPM   Continuous Infusions:  sodium chloride 10 mL/hr at 04/07/20 1853    ceFAZolin (ANCEF) IV     metronidazole 500 mg (04/08/20 1044)   vancomycin 1,500 mg (04/08/20 1411)     LOS: 10  days     Enzo Bi, MD [facility] If 7PM-7AM, please contact night-coverage 04/08/2020, 4:11 PM

## 2020-04-08 NOTE — Progress Notes (Signed)
Physical Therapy Treatment Patient Details Name: CEAIRA ERNSTER MRN: [id number] DOB: 04/10/1956 Today's Date: 04/08/2020    History of Present Illness Per MD note:[name] is a 64 y.o. female with medical history significant of metastatic breast cancer, diabetes mellitus, CKD stage III, CHF, presents in the emergency department with open draining left foot wound and associated fever.  Patient reports having this wound for a while,  she has recently established care with the wound clinic but it has recently gotten worse and started draining.  She reports seeing her primary care physician and was started on Keflex and then which was recently switched to Bactrim but she is not seeing any improvement.  She reports having fever,  associated chills and body ache.  she denies any shortness of breath,  cough. 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    PT Comments    Pt in recliner, ready for session. Stood multiple time during session with emphasis on increasing standing tolerance and overall strength.  Stood 5 x 2 minutes each.  Worked on sitting and standing exercises.  Overall improved tolerance for activity and remains highly motivated to get stronger.  She does need some cues for NWB when doing ex as she will often ask "which one?" and needs reminder that she is unable to do RLE in standing.    Discussed discharge plan.  SNF is planned but she stated she hopes to go home instead.  She will need a wheelchair if she does discharge home.  She has all other equipment needs except a ramp which is being addressed but not completed as of yet.   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 A cane, walker, crutch will not resolve the patient's issue with performing activities of daily living. A lightweight wheelchair and  cushion is required/recommended and will allow patient to safely perform daily activities. Patient can safely propel the wheelchair in the home or has a caregiver who can provide assistance.  She stated she has stairs to access home.  Bought a ramp of [employer] but it did not work.  Encouraged her to have family work on ramp access.  Stated her landlord has been asked but it is not completed yet.  She stated she does have help at home but voiced concern over ability to manage IV medications.    Will leave recommendation at SNF due to medical needs and mobility needs.  If she chooses to discharge home she will need EMS transport to get into her home if family cannot get her into the home.        Follow Up Recommendations  SNF     Equipment Recommendations  Wheelchair (measurements PT);Wheelchair cushion (measurements PT)    Recommendations for Other Services       Precautions / Restrictions Precautions Precautions: Fall Restrictions Weight Bearing Restrictions: Yes LLE Weight Bearing: Non weight bearing    Mobility  Bed Mobility               General bed mobility comments: not assessed - pt in chair upon arrival  Transfers Overall transfer level: Needs assistance Equipment used: Rolling walker (2 wheeled) Transfers: Sit to/from Stand Sit to Stand: Min guard;Min assist         General transfer comment: sit to stand much improved today  Ambulation/Gait  General Gait Details: deferred due to NWB and inability to maintain.  is pivoting with staff with RW   Stairs             Wheelchair Mobility    Modified Rankin (Stroke Patients Only)       Balance Overall balance assessment: Needs assistance Sitting-balance support: Feet supported Sitting balance-Leahy Scale: Good Sitting balance - Comments: no LOB with unsupported back   Standing balance support: Bilateral upper extremity supported Standing balance-Leahy Scale: Fair Standing  balance comment: able to remains standing with rw and min gaurd today                            Cognition Arousal/Alertness: Awake/alert Behavior During Therapy: WFL for tasks assessed/performed Overall Cognitive Status: Within Functional Limits for tasks assessed                                        Exercises Other Exercises Other Exercises: seated LAQ and ab/add  AROM, marching AAROM,  x 10.  standing LLE SLR 2 x 10 and marches 2 x 10, UE ex's using water bottle for weight    General Comments        Pertinent Vitals/Pain Pain Assessment: No/denies pain    Home Living                      Prior Function            PT Goals (current goals can now be found in the care plan section) Progress towards PT goals: Progressing toward goals    Frequency    Min 2X/week      PT Plan Current plan remains appropriate    Co-evaluation              AM-PAC PT "6 Clicks" Mobility   Outcome Measure  Help needed turning from your back to your side while in a flat bed without using bedrails?: A Little Help needed moving from lying on your back to sitting on the side of a flat bed without using bedrails?: A Little Help needed moving to and from a bed to a chair (including a wheelchair)?: A Little Help needed standing up from a chair using your arms (e.g., wheelchair or bedside chair)?: A Little Help needed to walk in hospital room?: Total Help needed climbing 3-5 steps with a railing? : Total 6 Click Score: 14    End of Session Equipment Utilized During Treatment: Gait belt Activity Tolerance: Patient tolerated treatment well Patient left: in chair;with call bell/phone within reach Nurse Communication: Mobility status       Time: 2035-5974 PT Time Calculation (min) (ACUTE ONLY): 39 min  Charges:  $Therapeutic Exercise: 23-37 mins $Therapeutic Activity: 8-22 mins                    Chesley Noon, PTA 04/08/20, 12:49 PM

## 2020-04-09 DIAGNOSIS — Z803 Family history of malignant neoplasm of breast: Secondary | ICD-10-CM | POA: Diagnosis not present

## 2020-04-09 DIAGNOSIS — K5900 Constipation, unspecified: Secondary | ICD-10-CM | POA: Diagnosis not present

## 2020-04-09 DIAGNOSIS — Z7401 Bed confinement status: Secondary | ICD-10-CM | POA: Diagnosis not present

## 2020-04-09 DIAGNOSIS — N184 Chronic kidney disease, stage 4 (severe): Secondary | ICD-10-CM | POA: Diagnosis not present

## 2020-04-09 DIAGNOSIS — I509 Heart failure, unspecified: Secondary | ICD-10-CM | POA: Diagnosis not present

## 2020-04-09 DIAGNOSIS — M86679 Other chronic osteomyelitis, unspecified ankle and foot: Secondary | ICD-10-CM | POA: Diagnosis not present

## 2020-04-09 DIAGNOSIS — Z01812 Encounter for preprocedural laboratory examination: Secondary | ICD-10-CM | POA: Diagnosis not present

## 2020-04-09 DIAGNOSIS — N185 Chronic kidney disease, stage 5: Secondary | ICD-10-CM | POA: Diagnosis not present

## 2020-04-09 DIAGNOSIS — R6520 Severe sepsis without septic shock: Secondary | ICD-10-CM | POA: Diagnosis not present

## 2020-04-09 DIAGNOSIS — D631 Anemia in chronic kidney disease: Secondary | ICD-10-CM | POA: Diagnosis not present

## 2020-04-09 DIAGNOSIS — L089 Local infection of the skin and subcutaneous tissue, unspecified: Secondary | ICD-10-CM | POA: Diagnosis not present

## 2020-04-09 DIAGNOSIS — J45909 Unspecified asthma, uncomplicated: Secondary | ICD-10-CM | POA: Diagnosis not present

## 2020-04-09 DIAGNOSIS — Z833 Family history of diabetes mellitus: Secondary | ICD-10-CM | POA: Diagnosis not present

## 2020-04-09 DIAGNOSIS — I120 Hypertensive chronic kidney disease with stage 5 chronic kidney disease or end stage renal disease: Secondary | ICD-10-CM | POA: Diagnosis not present

## 2020-04-09 DIAGNOSIS — R809 Proteinuria, unspecified: Secondary | ICD-10-CM | POA: Diagnosis not present

## 2020-04-09 DIAGNOSIS — E1122 Type 2 diabetes mellitus with diabetic chronic kidney disease: Secondary | ICD-10-CM | POA: Diagnosis not present

## 2020-04-09 DIAGNOSIS — I110 Hypertensive heart disease with heart failure: Secondary | ICD-10-CM | POA: Diagnosis not present

## 2020-04-09 DIAGNOSIS — N2581 Secondary hyperparathyroidism of renal origin: Secondary | ICD-10-CM | POA: Diagnosis not present

## 2020-04-09 DIAGNOSIS — M2550 Pain in unspecified joint: Secondary | ICD-10-CM | POA: Diagnosis not present

## 2020-04-09 DIAGNOSIS — I10 Essential (primary) hypertension: Secondary | ICD-10-CM | POA: Diagnosis not present

## 2020-04-09 DIAGNOSIS — C7951 Secondary malignant neoplasm of bone: Secondary | ICD-10-CM | POA: Diagnosis not present

## 2020-04-09 DIAGNOSIS — N179 Acute kidney failure, unspecified: Secondary | ICD-10-CM | POA: Diagnosis not present

## 2020-04-09 DIAGNOSIS — D649 Anemia, unspecified: Secondary | ICD-10-CM | POA: Diagnosis not present

## 2020-04-09 DIAGNOSIS — Z8349 Family history of other endocrine, nutritional and metabolic diseases: Secondary | ICD-10-CM | POA: Diagnosis not present

## 2020-04-09 DIAGNOSIS — Z792 Long term (current) use of antibiotics: Secondary | ICD-10-CM | POA: Diagnosis not present

## 2020-04-09 DIAGNOSIS — Z87891 Personal history of nicotine dependence: Secondary | ICD-10-CM | POA: Diagnosis not present

## 2020-04-09 DIAGNOSIS — Z8249 Family history of ischemic heart disease and other diseases of the circulatory system: Secondary | ICD-10-CM | POA: Diagnosis not present

## 2020-04-09 DIAGNOSIS — Z79899 Other long term (current) drug therapy: Secondary | ICD-10-CM | POA: Diagnosis not present

## 2020-04-09 DIAGNOSIS — F418 Other specified anxiety disorders: Secondary | ICD-10-CM | POA: Diagnosis not present

## 2020-04-09 DIAGNOSIS — N189 Chronic kidney disease, unspecified: Secondary | ICD-10-CM | POA: Diagnosis not present

## 2020-04-09 DIAGNOSIS — Z801 Family history of malignant neoplasm of trachea, bronchus and lung: Secondary | ICD-10-CM | POA: Diagnosis not present

## 2020-04-09 DIAGNOSIS — C7981 Secondary malignant neoplasm of breast: Secondary | ICD-10-CM | POA: Diagnosis not present

## 2020-04-09 DIAGNOSIS — E11311 Type 2 diabetes mellitus with unspecified diabetic retinopathy with macular edema: Secondary | ICD-10-CM | POA: Diagnosis not present

## 2020-04-09 DIAGNOSIS — Z5111 Encounter for antineoplastic chemotherapy: Secondary | ICD-10-CM | POA: Diagnosis not present

## 2020-04-09 DIAGNOSIS — F419 Anxiety disorder, unspecified: Secondary | ICD-10-CM | POA: Diagnosis not present

## 2020-04-09 DIAGNOSIS — Z7951 Long term (current) use of inhaled steroids: Secondary | ICD-10-CM | POA: Diagnosis not present

## 2020-04-09 DIAGNOSIS — M86172 Other acute osteomyelitis, left ankle and foot: Secondary | ICD-10-CM | POA: Diagnosis not present

## 2020-04-09 DIAGNOSIS — E119 Type 2 diabetes mellitus without complications: Secondary | ICD-10-CM | POA: Diagnosis not present

## 2020-04-09 DIAGNOSIS — M79672 Pain in left foot: Secondary | ICD-10-CM | POA: Diagnosis not present

## 2020-04-09 DIAGNOSIS — Z7982 Long term (current) use of aspirin: Secondary | ICD-10-CM | POA: Diagnosis not present

## 2020-04-09 DIAGNOSIS — Z800 Family history of malignant neoplasm of digestive organs: Secondary | ICD-10-CM | POA: Diagnosis not present

## 2020-04-09 DIAGNOSIS — Z794 Long term (current) use of insulin: Secondary | ICD-10-CM | POA: Diagnosis not present

## 2020-04-09 DIAGNOSIS — F329 Major depressive disorder, single episode, unspecified: Secondary | ICD-10-CM | POA: Diagnosis not present

## 2020-04-09 DIAGNOSIS — Z20822 Contact with and (suspected) exposure to covid-19: Secondary | ICD-10-CM | POA: Diagnosis not present

## 2020-04-09 DIAGNOSIS — Z923 Personal history of irradiation: Secondary | ICD-10-CM | POA: Diagnosis not present

## 2020-04-09 DIAGNOSIS — C50212 Malignant neoplasm of upper-inner quadrant of left female breast: Secondary | ICD-10-CM | POA: Diagnosis not present

## 2020-04-09 DIAGNOSIS — E11628 Type 2 diabetes mellitus with other skin complications: Secondary | ICD-10-CM | POA: Diagnosis not present

## 2020-04-09 DIAGNOSIS — A419 Sepsis, unspecified organism: Secondary | ICD-10-CM | POA: Diagnosis not present

## 2020-04-09 DIAGNOSIS — Z170 Estrogen receptor positive status [ER+]: Secondary | ICD-10-CM | POA: Diagnosis not present

## 2020-04-09 DIAGNOSIS — R5381 Other malaise: Secondary | ICD-10-CM | POA: Diagnosis not present

## 2020-04-09 DIAGNOSIS — N390 Urinary tract infection, site not specified: Secondary | ICD-10-CM | POA: Diagnosis not present

## 2020-04-09 LAB — BASIC METABOLIC PANEL
Anion gap: 7 (ref 5–15)
BUN: 27 mg/dL — ABNORMAL HIGH (ref 8–23)
CO2: 22 mmol/L (ref 22–32)
Calcium: 8.4 mg/dL — ABNORMAL LOW (ref 8.9–10.3)
Chloride: 108 mmol/L (ref 98–111)
Creatinine, Ser: 1.79 mg/dL — ABNORMAL HIGH (ref 0.44–1.00)
GFR calc Af Amer: 34 mL/min — ABNORMAL LOW (ref >60)
GFR calc non Af Amer: 30 mL/min — ABNORMAL LOW (ref >60)
Glucose, Bld: 138 mg/dL — ABNORMAL HIGH (ref 70–99)
Potassium: 4.7 mmol/L (ref 3.5–5.1)
Sodium: 137 mmol/L (ref 135–145)

## 2020-04-09 LAB — CBC
HCT: 23 % — ABNORMAL LOW (ref 36.0–46.0)
Hemoglobin: 7.5 g/dL — ABNORMAL LOW (ref 12.0–15.0)
MCH: 30.1 pg (ref 26.0–34.0)
MCHC: 32.6 g/dL (ref 30.0–36.0)
MCV: 92.4 fL (ref 80.0–100.0)
Platelets: 377 10*3/uL (ref 150–400)
RBC: 2.49 MIL/uL — ABNORMAL LOW (ref 3.87–5.11)
RDW: 17.5 % — ABNORMAL HIGH (ref 11.5–15.5)
WBC: 12.5 10*3/uL — ABNORMAL HIGH (ref 4.0–10.5)
nRBC: 0.2 % (ref 0.0–0.2)

## 2020-04-09 LAB — GLUCOSE, CAPILLARY
Glucose-Capillary: 150 mg/dL — ABNORMAL HIGH (ref 70–99)
Glucose-Capillary: 210 mg/dL — ABNORMAL HIGH (ref 70–99)

## 2020-04-09 LAB — MAGNESIUM: Magnesium: 2 mg/dL (ref 1.7–2.4)

## 2020-04-09 MED ORDER — SODIUM CHLORIDE 0.9 % IV SOLN
INTRAVENOUS | Status: DC | PRN
  Administered 2020-04-09: 250 mL via INTRAVENOUS

## 2020-04-09 MED ORDER — FOLIC ACID 1 MG PO TABS: 1.0000 mg | ORAL_TABLET | Freq: Every day | ORAL | Status: DC

## 2020-04-09 NOTE — Discharge Summary (Signed)
Physician Discharge Summary   [name]  female DOB: Aug 24, 1955  HWE:[id number]  PCP: [name], MD  Admit date: 03/29/2020 Discharge date: 04/09/2020  Admitted From: home Disposition:  SNF CODE STATUS: Full code  Discharge Instructions    Discharge instructions   Complete by: As directed    Moderna COVID-19 Vaccine 04/06/2020 11:23 AM 0.5 mL 07/2019 Intramuscular   Manufacturer: Levan Hurst  Lot: [id number]  NDC: 75102-585-27  Pt will need her 2nd dose while in SNF rehab. - -   Discharge wound care:   Complete by: As directed    Dressing changes with betadine dressing changes daily.  Should maintain NWB to left foot.  F/U with podiatry Dr. Vickki Muff in outpt clinic in 2-3 weeks.   Dr. Enzo Bi - -   Home infusion instructions   Complete by: As directed    Instructions: Flushing of vascular access device: 0.9% NaCl pre/post medication administration and prn patency; Heparin 100 u/ml, 90ml for implanted ports and Heparin 10u/ml, 24ml for all other central venous catheters.       Hospital Course:  For full details, please see H&P, progress notes, consult notes and ancillary notes.  Briefly,  Gloria A Mebaneis a 64 y.o. AAfemalewith medical history significant ofmetastatic breast cancer, diabetes mellitus, CKD stage III, CHF, presents in the emergency department with open draining left foot wound and associated fever.  Patient was admitted for sepsis secondary to infected left foot wound.  MRI consistent with osteomyelitis.    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 Leukocytosis 25.2. Source foot infection.  She received IV fluids in the ED, Lactic acid normalized.  Pt was started on vanc/cefepime/flagyl on presentation and switched to vanc/cefazolin/flagyl.  Recent otpt cx MRSA.  Inpatient cx with E coli and Bone  margins +.  Due to continued need for IV abx after discharge, [place] cath was placed on 9/1.  ID was consulted and recommended transitioning to vanc/ceftriaxone and oral Flagyl at discharge, with stop date 04/27/20.  Pt will follow up with ID in clinic week of 9/20.    Pt to have dressing changes with betadine daily and should maintain NWB to left foot.  F/U with podiatry Dr. Vickki Muff in outpt clinic in 2 weeks.  Lactic acidosis, resolved Due to infection.  Resolved with IVF.  Leukocytosis WBC remained elevated despite being on broad-spectrum abx.  No fever.  WBC finally started to trend down on 9/5, and was 12.5 on the day of discharge.  AKI on CKD stage IIIb:  Cr 2.74 on presentation.  Baseline creatinine around 2.1.AKI resolved with IVF and treatment of infection.  Cr 1.79 on the day of discharge.  Anemia of chronic disease and folate def No obvious bleeding, hemoglobin is stable.  Iron level wnl.  Vit B12 was 2871, so vit B12 suppl d/c'ed.  Folate was low at 4.9, so oral suppl started.  Hypertension BP has been elevated despite being on pt's full home BP regimen, but pt prefers not to change her home BP regimen.  Continued full home regimen of amlodipine, atenolol, clonidine and enalapril.  Diabetes mellitus BG has been within goal range with Levemir 25u daily (reduced from prior home dose) and SSI.  Pt was discharged on Levemir 25u daily and meal-time short acting 5u TID with meals.  Elevated LFTs Could be due to metastatic lesion. Improved.  Metastatic breast cancer; She follows up outpatient with  oncologist. Hem / Oncology consulted during this hospitalization.  Depression with anxiety: Continued home Prozac, and Xanax PRN    Discharge Diagnoses:  Principal Problem:   Sepsis ([place]) Active Problems:   Asthma   Benign essential HTN   Chronic kidney disease (CKD), stage V (HCC)   Chronic systolic CHF (congestive heart failure) (HCC)   Anxiety and depression    Type 2 diabetes mellitus with diabetic chronic kidney disease (HCC)   MI (mitral incompetence)   Carcinoma of upper-inner quadrant of left breast in female, estrogen receptor positive ([place])   Metastasis from malignant tumor of breast (HCC)   Anemia of chronic disease   AKI (acute kidney injury) ([place])   Hypertension complicating diabetes (HCC)   Lactic acidosis   Osteomyelitis (HCC)   Diabetic foot ulcer (HCC)   CKD (chronic kidney disease), stage III    Discharge Instructions:  Allergies as of 04/09/2020      Reactions   Chlorhexidine    Fish-derived Products    Sulfamethoxazole-trimethoprim Other (See Comments)      Medication List    STOP taking these medications   doxycycline 100 MG capsule Commonly known as: VIBRAMYCIN   Imodium A-D 2 MG capsule Generic drug: loperamide   vitamin B-12 1000 MCG tablet Commonly known as: CYANOCOBALAMIN     TAKE these medications   acetaminophen-codeine 300-60 MG tablet Commonly known as: TYLENOL #4 Take 1 tablet by mouth 2 (two) times daily as needed for moderate pain.   ALPRAZolam 0.5 MG tablet Commonly known as: XANAX Take 0.5 mg by mouth 2 (two) times daily as needed for anxiety or sleep.   amLODipine 10 MG tablet Commonly known as: NORVASC Take 10 mg by mouth daily.   aspirin EC 81 MG tablet Take 81 mg by mouth daily.   atenolol 100 MG tablet Commonly known as: TENORMIN Take 100 mg by mouth 2 (two) times daily.   bumetanide 0.5 MG tablet Commonly known as: BUMEX Take 0.5 mg by mouth 2 (two) times daily.   calcitRIOL 0.25 MCG capsule Commonly known as: ROCALTROL Take 0.25 mcg by mouth daily.   cefTRIAXone  IVPB Commonly known as: ROCEPHIN Inject 2 g into the vein daily for 22 days. Indication: foot osteomyelitis Last Day of Therapy: 04/27/2020 Labs - Sunday/Monday:  CBC/D, CMP, CRP and vancomycin trough. Labs - Thursday:  BMP and vancomycin trough   Cinnamon 500 MG capsule Take 500 mg by mouth 2 (two) times  daily.   cloNIDine 0.2 MG tablet Commonly known as: CATAPRES Take 0.2 mg by mouth 2 (two) times daily.   docusate sodium 100 MG capsule Commonly known as: COLACE Take 1 capsule (100 mg total) by mouth 2 (two) times daily.   enalapril 10 MG tablet Commonly known as: VASOTEC Take 20 mg by mouth 2 (two) times a day.   famotidine 20 MG tablet Commonly known as: PEPCID Take 20 mg by mouth 2 (two) times daily.   ferrous sulfate 325 (65 FE) MG tablet Take 325 mg by mouth daily with breakfast.   FLUoxetine 20 MG capsule Commonly known as: PROZAC Take 20 mg by mouth 2 (two) times daily.   folic acid 1 MG tablet Commonly known as: FOLVITE Take 1 tablet (1 mg total) by mouth daily. Start taking on: April 10, 2020   gabapentin 100 MG capsule Commonly known as: NEURONTIN TAKE 1 CAPSULE(100 MG) BY MOUTH AT BEDTIME   glyBURIDE 5 MG tablet Commonly known as: DIABETA Take 10 mg by mouth 2 (  two) times daily with a meal.   Levemir FlexTouch 100 UNIT/ML FlexPen Generic drug: insulin detemir Inject 25 Units into the skin daily. What changed: how much to take   metroNIDAZOLE 500 MG tablet Commonly known as: Flagyl Take 1 tablet (500 mg total) by mouth 3 (three) times daily for 22 days.   NovoLOG FlexPen 100 UNIT/ML FlexPen Generic drug: insulin aspart Inject 5 Units into the skin 3 (three) times daily with meals. What changed:   how much to take  when to take this   ProAir HFA 108 (90 Base) MCG/ACT inhaler Generic drug: albuterol Inhale 2 puffs into the lungs every 6 (six) hours as needed for wheezing or shortness of breath.   senna 8.6 MG Tabs tablet Commonly known as: SENOKOT Take 1 tablet (8.6 mg total) by mouth 2 (two) times daily as needed for mild constipation or moderate constipation.   simvastatin 20 MG tablet Commonly known as: ZOCOR Take 20 mg by mouth every evening.   vancomycin  IVPB Inject 1,500 mg into the vein every other day for 21 days. Indication:  foot osteomyelitis Last Day of Therapy: 04/27/2020 Labs - Sunday/Monday:  CBC/D, CMP, CRP and vancomycin trough. Labs - Thursday:  BMP and vancomycin trough            Home Infusion Instuctions  (From admission, onward)         Start     Ordered   04/06/20 0000  Home infusion instructions       Question:  Instructions  Answer:  Flushing of vascular access device: 0.9% NaCl pre/post medication administration and prn patency; Heparin 100 u/ml, 19ml for implanted ports and Heparin 10u/ml, 62ml for all other central venous catheters.   04/06/20 4696           Discharge Care Instructions  (From admission, onward)         Start     Ordered   04/09/20 0000  Discharge wound care:       Comments: Dressing changes with betadine dressing changes daily.  Should maintain NWB to left foot.  F/U with podiatry Dr. Vickki Muff in outpt clinic in 2-3 weeks.   Dr. Enzo Bi - -   04/09/20 1032           Contact information for follow-up providers    Leonel Ramsay, MD On 04/23/2020.   Specialty: Infectious Diseases Why: peak needs to call and make the appointment Contact information: [place] [address] [phone]        [name], MD. Schedule an appointment as soon as possible for a visit in 1 week.   Specialty: Internal Medicine Why: peak needs to make the appointment Contact information: [address] [place] [address] [phone]        Samara Deist, [place]. Schedule an appointment as soon as possible for a visit in 2 weeks.   Specialty: Podiatry Why: peak has to make the appointment Contact information: [place] [address] [phone]        [place].   Specialty: Emergency Medicine Contact information: [place] [phone] [place] [phone]           Contact  information for after-discharge care    Destination    [facility] Preferred SNF.   Service: Skilled Nursing Contact information: [address] [place] [phone]  Allergies  Allergen Reactions  . Chlorhexidine   . Fish-Derived Products   . Sulfamethoxazole-Trimethoprim Other (See Comments)     The results of significant diagnostics from this hospitalization (including imaging, microbiology, ancillary and laboratory) are listed below for reference.   Consultations:   Procedures/Studies: DG Chest 2 View  Result Date: 03/29/2020 CLINICAL DATA:  Shortness of breath and dizziness.  Breast carcinoma EXAM: CHEST - 2 VIEW COMPARISON:  Chest radiograph September 27, 2019; PET-CT March 28, 2020 FINDINGS: There are pleural effusions bilaterally with bibasilar atelectatic change. The masslike area in the lingula seen on recent PET study is appreciable by radiography measuring 1.4 x 1.3 cm. Lungs elsewhere are clear. Heart is upper normal in size with pulmonary vascularity normal. Port-A-Cath tip is in the superior vena cava. No adenopathy is evident by radiography. IMPRESSION: Stable nodular opacity in the lingula measuring 1.4 x 1.3 cm. Bilateral pleural effusions with bibasilar atelectasis. No new opacity evident compared to recent PET study. Heart upper normal in size.  Port-A-Cath tip in superior vena cava. Electronically Signed   By: Lowella Grip III M.D.   On: 03/29/2020 12:33   MR FOOT LEFT WO CONTRAST  Result Date: 03/30/2020 CLINICAL DATA:  Foot swelling, diabetes, possible osteomyelitis. Gas in the soft tissues at the base of the small toe. EXAM: MRI OF THE LEFT FOOT WITHOUT CONTRAST TECHNIQUE: Multiplanar, multisequence MR imaging of the left forefoot was performed. No intravenous contrast was administered. COMPARISON:  Radiographs 03/29/2020 FINDINGS: Bones/Joint/Cartilage Abnormal edema, cortical  destruction/demineralization, and reduced T1 signal in the distal metaphysis and head of the fifth metatarsal and in the proximal phalanx of the small toe (especially proximally) compatible with osteomyelitis. Septic joint is a distinct possibility although the lack of a substantial joint effusion may indicate that the joint is draining to the plantar skin surface. There is synovitis in the fifth MTP joint as well as gas along the margins of the joint capsule. No other regions of osteomyelitis identified. Ligaments Lisfranc ligament intact. Muscles and Tendons Diffuse edema within and along regional musculature which could be neurogenic or due to myositis. Soft tissues Lateral and plantar to the fifth MTP joint there is abnormal gas, suspected cutaneous ulceration, and extensive inflammation. Some tiny amount of metal artifact in this vicinity possibly from bandaging, there is no visible metal foreign body on recent radiographs. Cannot exclude draining sinus tract to the skin from the fifth MTP joint. Do not see a readily drainable abscess although there is extensive subcutaneous edema tracking in the foot, especially dorsally, and into the toes. Cellulitis is a distinct possibility. IMPRESSION: 1. Osteomyelitis of the distal metaphysis and head of the fifth metatarsal and in the proximal phalanx of the small toe. 2. Fifth MTP septic joint is a distinct possibility although the lack of a substantial joint effusion may indicate that the joint is draining to the plantar or lateral surfaces. Loculations of gas in the soft tissues along the margins of the fifth MTP joint. 3. Extensive subcutaneous edema tracking in the foot, especially dorsally, and into the toes. Cellulitis is a distinct possibility. 4. Diffuse edema within and along regional musculature which could be neurogenic or due to myositis. Electronically Signed   By: Van Clines M.D.   On: 03/30/2020 08:50   PERIPHERAL VASCULAR  CATHETERIZATION  Result Date: 04/04/2020 See Op Note  NM PET Image Restag (PS) Skull Base To Thigh  Result Date: 03/28/2020 CLINICAL DATA:  Subsequent treatment strategy for breast cancer. EXAM: NUCLEAR MEDICINE  PET SKULL BASE TO THIGH TECHNIQUE: 10.2 mCi F-18 FDG was injected intravenously. Full-ring PET imaging was performed from the skull base to thigh after the radiotracer. CT data was obtained and used for attenuation correction and anatomic localization. Fasting blood glucose: 47 mg/dl COMPARISON:  12/12/2019. FINDINGS: Mediastinal blood pool activity: SUV max 1.9 Liver activity: SUV max NA NECK: No abnormal hypermetabolism. Incidental CT findings: None. CHEST: New and pre-existing hypermetabolic pleural/subpleural nodules are seen bilaterally. Index 1.4 x 1.8 cm juxta mediastinal subpleural right upper lobe nodule has an SUV max of 7.1, compared to 3.2 previously. 10 mm subcarinal lymph node has an SUV max of 3.8. No hypermetabolic axillary adenopathy. Lateral left breast mass measures similar, 1.8 x 3.3 cm with an SUV max 4.4, compared to 3.4 previously. Incidental CT findings: Right IJ Port-A-Cath terminates in the SVC. Atherosclerotic calcification of the aorta. Heart is enlarged. No pericardial effusion. Moderate right pleural effusion and small left pleural effusion, increased, with associated pleural thickening. ABDOMEN/PELVIS: New hypermetabolism in both adrenal glands, with an index SUV mass on the right of 4.4. The CT appearance of the adrenal glands is unchanged from 12/12/2019, however. No abnormal hypermetabolism in the liver, spleen or pancreas. Left external iliac lymph nodes are mildly hypermetabolic. Index 7 mm left external iliac lymph node (3/217) has increased slightly in size from 4 mm on 12/12/2019 and has an SUV max of 2.8. Uptake associated with soft tissue thickening along the right paramidline ventral abdominal wall may be postoperative in etiology. Incidental CT findings: Liver  is unremarkable. Cholecystectomy. Adrenal glands are otherwise unremarkable. 1.4 cm low-attenuation lesion off the right kidney is likely a cyst although definitive characterization is limited postcontrast imaging. Kidneys, spleen, pancreas, stomach and bowel are otherwise grossly unremarkable. SKELETON: Treated sclerotic lesions are seen. For example, an index lesion in the right acetabulum does not show abnormal hypermetabolism. There are new foci of hypermetabolism within the spine. Index 5 mm lytic lesion in the T10 vertebral body (3/117) has an SUV max of 4.8. Incidental CT findings: Degenerative changes in the spine. IMPRESSION: 1. Progressive pleuroparenchymal and osseous metastatic disease. 2. New hypermetabolic lymph nodes in the subcarinal station and left external iliac chain. 3. Similar hypermetabolic primary left breast mass. 4. New hypermetabolism within the adrenal glands, without definite CT correlate. Continued attention on follow-up exams is warranted. 5. Moderate right and small left pleural effusions, increased, with associated pleural thickening. 6.  Aortic atherosclerosis (ICD10-I70.0). Electronically Signed   By: Lorin Picket M.D.   On: 03/28/2020 10:51   DG Foot Complete Left  Result Date: 04/03/2020 CLINICAL DATA:  Fifth ray amputation EXAM: LEFT FOOT - COMPLETE 3+ VIEW COMPARISON:  None. FINDINGS: Interval postsurgical changes from transmetatarsal amputation of the fifth ray at the level of the proximal diaphysis. Resection margin is smooth. Expected postoperative changes within the distal soft tissues at the amputation site. No acute fracture. No malalignment. No new areas of cortical irregularity. IMPRESSION: Interval postsurgical changes from transmetatarsal amputation of the fifth ray at the level of the proximal diaphysis. Electronically Signed   By: Davina Poke D.O.   On: 04/03/2020 16:23   DG Foot Complete Left  Result Date: 03/29/2020 CLINICAL DATA:  Left foot wound,  initial encounter EXAM: LEFT FOOT - COMPLETE 3+ VIEW COMPARISON:  None. FINDINGS: Considerable soft tissue swelling is noted in the distal aspect of the foot. Some subcutaneous air is noted about the fifth MTP joint. These changes correspond to the given clinical history of localized  infection. No definitive fracture or bony erosive changes are noted. No other focal abnormality is seen. IMPRESSION: Soft tissue swelling and subcutaneous air consistent with localized infection predominately about the fifth MTP joint. No definitive erosive changes are noted. If clinically indicated MRI may be helpful for further evaluation. Electronically Signed   By: Inez Catalina M.D.   On: 03/29/2020 15:56   DG MINI C-ARM IMAGE ONLY  Result Date: 03/30/2020 There is no interpretation for this exam.  This order is for images obtained during a surgical procedure.  Please See "Surgeries" Tab for more information regarding the procedure.   ECHOCARDIOGRAM COMPLETE  Result Date: 03/30/2020    ECHOCARDIOGRAM REPORT   Patient Name:   HOLLYN STUCKY Date of Exam: 03/30/2020 Medical Rec #:  [id number]      Height:       62.0 in Accession #:    [id number]     Weight:       194.0 lb Date of Birth:  1956/02/27       BSA:          1.887 m Patient Age:    64 years       BP:           129/76 mmHg Patient Gender: F              HR:           88 bpm. Exam Location:  [facility] Procedure: 2D Echo, Cardiac Doppler and Color Doppler Indications:     Fever 780.6  History:         Patient has no prior history of Echocardiogram examinations.                  CHF, Stroke, Arrythmias:PAC; Risk Factors:Hypertension.  Sonographer:     Sherrie Sport RDCS (AE) Referring Phys:  [place] Diagnosing Phys: Serafina Royals MD IMPRESSIONS  1. Left ventricular ejection fraction, by estimation, is 55 to 60%. The left ventricle has normal function. The left ventricle has no regional wall motion abnormalities. Left ventricular diastolic parameters were normal.  2.  Right ventricular systolic function is normal. The right ventricular size is normal. There is normal pulmonary artery systolic pressure.  3. Left atrial size was mildly dilated.  4. The mitral valve is normal in structure. Mild to moderate mitral valve regurgitation.  5. The aortic valve is normal in structure. Aortic valve regurgitation is not visualized. FINDINGS  Left Ventricle: Left ventricular ejection fraction, by estimation, is 55 to 60%. The left ventricle has normal function. The left ventricle has no regional wall motion abnormalities. The left ventricular internal cavity size was normal in size. There is  no left ventricular hypertrophy. Left ventricular diastolic parameters were normal. Right Ventricle: The right ventricular size is normal. No increase in right ventricular wall thickness. Right ventricular systolic function is normal. There is normal pulmonary artery systolic pressure. The tricuspid regurgitant velocity is 2.42 m/s, and  with an assumed right atrial pressure of 10 mmHg, the estimated right ventricular systolic pressure is 37.1 mmHg. Left Atrium: Left atrial size was mildly dilated. Right Atrium: Right atrial size was normal in size. Pericardium: There is no evidence of pericardial effusion. Mitral Valve: The mitral valve is normal in structure. Mild to moderate mitral valve regurgitation. Tricuspid Valve: The tricuspid valve is normal in structure. Tricuspid valve regurgitation is trivial. Aortic Valve: The aortic valve is normal in structure. Aortic valve regurgitation is not visualized. Aortic valve mean gradient measures  6.0 mmHg. Aortic valve peak gradient measures 10.4 mmHg. Aortic valve area, by VTI measures 1.74 cm. Pulmonic Valve: The pulmonic valve was normal in structure. Pulmonic valve regurgitation is not visualized. Aorta: The aortic root and ascending aorta are structurally normal, with no evidence of dilitation. IAS/Shunts: No atrial level shunt detected by color flow  Doppler.  LEFT VENTRICLE PLAX 2D LVIDd:         4.30 cm  Diastology LVIDs:         2.79 cm  LV e' lateral:   7.51 cm/s LV PW:         1.43 cm  LV E/e' lateral: 14.1 LV IVS:        1.17 cm  LV e' medial:    6.31 cm/s LVOT diam:     2.00 cm  LV E/e' medial:  16.8 LV SV:         58 LV SV Index:   31 LVOT Area:     3.14 cm  RIGHT VENTRICLE RV Basal diam:  2.73 cm RV S prime:     15.20 cm/s TAPSE (M-mode): 3.9 cm LEFT ATRIUM             Index       RIGHT ATRIUM           Index LA diam:        4.30 cm 2.28 cm/m  RA Area:     16.00 cm LA Vol (A2C):   65.2 ml 34.55 ml/m RA Volume:   41.00 ml  21.73 ml/m LA Vol (A4C):   42.3 ml 22.42 ml/m LA Biplane Vol: 55.4 ml 29.36 ml/m  AORTIC VALVE                    PULMONIC VALVE AV Area (Vmax):    1.67 cm     PV Vmax:        0.71 m/s AV Area (Vmean):   1.59 cm     PV Peak grad:   2.0 mmHg AV Area (VTI):     1.74 cm     RVOT Peak grad: 3 mmHg AV Vmax:           161.00 cm/s AV Vmean:          114.333 cm/s AV VTI:            0.337 m AV Peak Grad:      10.4 mmHg AV Mean Grad:      6.0 mmHg LVOT Vmax:         85.70 cm/s LVOT Vmean:        57.800 cm/s LVOT VTI:          0.186 m LVOT/AV VTI ratio: 0.55  AORTA Ao Root diam: 2.70 cm MITRAL VALVE                TRICUSPID VALVE MV Area (PHT): 5.66 cm     TR Peak grad:   23.4 mmHg MV Decel Time: 134 msec     TR Vmax:        242.00 cm/s MV E velocity: 106.00 cm/s MV A velocity: 150.00 cm/s  SHUNTS MV E/A ratio:  0.71         Systemic VTI:  0.19 m                             Systemic Diam: 2.00 cm Serafina Royals MD Electronically signed by Serafina Royals  MD Signature Date/Time: 03/30/2020/1:20:11 PM    Final       Labs: BNP (last 3 results) Recent Labs    09/27/19 1406  BNP 74.2   Basic Metabolic Panel: Recent Labs  Lab 04/03/20 0821 04/03/20 0821 04/04/20 0505 04/04/20 0505 04/05/20 0430 04/06/20 0454 04/07/20 1102 04/08/20 0757 04/09/20 0454  NA 137   < > 139   < > 140 138 135 137 137  K 4.7   < > 4.0   < > 4.3  4.4 4.6 4.6 4.7  CL 109   < > 110   < > 112* 110 108 107 108  CO2 21*   < > 21*   < > 25 22 20* 22 22  GLUCOSE 217*   < > 82   < > 169* 151* 245* 122* 138*  BUN 30*   < > 27*   < > 23 23 21  26* 27*  CREATININE 1.92*   < > 1.90*   < > 1.70* 1.75* 1.93* 2.14* 1.79*  CALCIUM 8.6*   < > 8.7*   < > 8.5* 8.6* 8.7* 8.7* 8.4*  MG 2.1   < > 2.1   < > 2.0 2.1 2.1 2.1 2.0  PHOS 2.3*  --  2.9  --   --   --   --   --   --    < > = values in this interval not displayed.   Liver Function Tests: No results for input(s): AST, ALT, ALKPHOS, BILITOT, PROT, ALBUMIN in the last 168 hours. No results for input(s): LIPASE, AMYLASE in the last 168 hours. No results for input(s): AMMONIA in the last 168 hours. CBC: Recent Labs  Lab 04/05/20 0430 04/06/20 0454 04/07/20 1102 04/08/20 0757 04/09/20 0454  WBC 14.1* 15.3* 17.4* 12.6* 12.5*  HGB 7.8* 8.1* 8.9* 8.8* 7.5*  HCT 24.2* 23.8* 26.9* 26.3* 23.0*  MCV 92.0 89.1 90.9 90.7 92.4  PLT 475* 464* 471* 438* 377   Cardiac Enzymes: No results for input(s): CKTOTAL, CKMB, CKMBINDEX, TROPONINI in the last 168 hours. BNP: Invalid input(s): POCBNP CBG: Recent Labs  Lab 04/08/20 0806 04/08/20 1225 04/08/20 1724 04/08/20 2024 04/09/20 0806  GLUCAP 123* 302* 260* 181* 150*   D-Dimer No results for input(s): DDIMER in the last 72 hours. Hgb A1c No results for input(s): HGBA1C in the last 72 hours. Lipid Profile No results for input(s): CHOL, HDL, LDLCALC, TRIG, CHOLHDL, LDLDIRECT in the last 72 hours. Thyroid function studies No results for input(s): TSH, T4TOTAL, T3FREE, THYROIDAB in the last 72 hours.  Invalid input(s): FREET3 Anemia work up Recent Labs    04/07/20 [place] 2,871*   Urinalysis    Component Value Date/Time   COLORURINE YELLOW (A) 03/01/2020 1850   APPEARANCEUR HAZY (A) 03/01/2020 1850   LABSPEC 1.009 03/01/2020 1850   PHURINE 5.0 03/01/2020 1850   GLUCOSEU >=500 (A) 03/01/2020 1850   HGBUR SMALL (A) 03/01/2020  1850   BILIRUBINUR NEGATIVE 03/01/2020 [place] NEGATIVE 03/01/2020 1850   PROTEINUR 100 (A) 03/01/2020 1850   NITRITE NEGATIVE 03/01/2020 1850   LEUKOCYTESUR LARGE (A) 03/01/2020 1850   Sepsis Labs Invalid input(s): PROCALCITONIN,  WBC,  LACTICIDVEN Microbiology Recent Results (from the past 240 hour(s))  Aerobic/Anaerobic Culture (surgical/deep wound)     Status: None   Collection Time: 03/30/20  1:19 PM   Specimen: PATH Other; Tissue  Result Value Ref Range Status   Specimen Description   Final    WOUND Performed  at [facility], [address], [place], [place] [address]    Special Requests   Final    LEFT 5TH TOE JOINT INFECTION Performed at [facility], [place], [place] [address]    Gram Stain NO WBC SEEN RARE GRAM POSITIVE COCCI IN PAIRS   Final   Culture   Final    FEW ESCHERICHIA COLI NO ANAEROBES ISOLATED Performed at [facility], [place] [address], [place], [place] [address]    Report Status 04/04/2020 FINAL  Final   Organism ID, Bacteria ESCHERICHIA COLI  Final      Susceptibility   Escherichia coli - MIC*    AMPICILLIN >=32 RESISTANT Resistant     CEFAZOLIN <=4 SENSITIVE Sensitive     CEFEPIME <=0.12 SENSITIVE Sensitive     CEFTAZIDIME <=1 SENSITIVE Sensitive     CEFTRIAXONE <=0.25 SENSITIVE Sensitive     CIPROFLOXACIN <=0.25 SENSITIVE Sensitive     GENTAMICIN >=16 RESISTANT Resistant     IMIPENEM <=0.25 SENSITIVE Sensitive     TRIMETH/SULFA >=320 RESISTANT Resistant     AMPICILLIN/SULBACTAM >=32 RESISTANT Resistant     PIP/TAZO <=4 SENSITIVE Sensitive     * FEW ESCHERICHIA COLI  MRSA PCR Screening     Status: None   Collection Time: 04/02/20  8:11 AM   Specimen: Nasopharyngeal  Result Value Ref Range Status   MRSA by PCR NEGATIVE NEGATIVE Final    Comment:        The GeneXpert MRSA Assay (FDA approved for NASAL specimens only), is one component of a comprehensive MRSA colonization surveillance  program. It is not intended to diagnose MRSA infection nor to guide or monitor treatment for MRSA infections. Performed at [facility], [address] [place], [place] [address]   SARS Coronavirus 2 by RT PCR (hospital order, performed in [facility] hospital lab) Nasopharyngeal Nasopharyngeal Swab     Status: None   Collection Time: 04/05/20  2:15 PM   Specimen: Nasopharyngeal Swab  Result Value Ref Range Status   SARS Coronavirus 2 NEGATIVE NEGATIVE Final    Comment: (NOTE) SARS-CoV-2 target nucleic acids are NOT DETECTED.  The SARS-CoV-2 RNA is generally detectable in upper and lower respiratory specimens during the acute phase of infection. The lowest concentration of SARS-CoV-2 viral copies this assay can detect is 250 copies / mL. A negative result does not preclude SARS-CoV-2 infection and should not be used as the sole basis for treatment or other patient management decisions.  A negative result may occur with improper specimen collection / handling, submission of specimen other than nasopharyngeal swab, presence of viral mutation(s) within the areas targeted by this assay, and inadequate number of viral copies (<250 copies / mL). A negative result must be combined with clinical observations, patient history, and epidemiological information.  Fact Sheet for Patients:   StrictlyIdeas.no  Fact Sheet for Healthcare Providers: BankingDealers.co.za  This test is not yet approved or  cleared by the [place] FDA and has been authorized for detection and/or diagnosis of SARS-CoV-2 by FDA under an Emergency Use Authorization (EUA).  This EUA will remain in effect (meaning this test can be used) for the duration of the COVID-19 declaration under Section 564(b)(1) of the Act, 21 U.S.C. section 360bbb-3(b)(1), unless the authorization is terminated or revoked sooner.  Performed at [facility], [address] [place], [place] [address]      Total time spend on discharging this patient, including the last patient exam,  discussing the hospital stay, instructions for ongoing care as it relates to all pertinent caregivers, as well as preparing the medical discharge records, prescriptions, and/or referrals as applicable, is 35 minutes.    Enzo Bi, MD  [facility] 04/09/2020, 10:56 AM  If 7PM-7AM, please contact night-coverage

## 2020-04-09 NOTE — Progress Notes (Signed)
[name]  A and O x 4 VSS. Pt tolerating diet well. No complaints of pain or nausea. IV removed intact, prescriptions given. Pt voices understanding of discharge instructions with no further questions. Pt discharged to [facility] via ambulance.   Allergies as of 04/09/2020      Reactions   [name]    [name]    [name] Other (See Comments)      Medication List    STOP taking these medications   doxycycline 100 MG capsule Commonly known as: VIBRAMYCIN   Imodium A-D 2 MG capsule Generic drug: loperamide   vitamin B-12 1000 MCG tablet Commonly known as: CYANOCOBALAMIN     TAKE these medications   acetaminophen-codeine 300-60 MG tablet Commonly known as: TYLENOL #4 Take 1 tablet by mouth 2 (two) times daily as needed for moderate pain.   ALPRAZolam 0.5 MG tablet Commonly known as: XANAX Take 0.5 mg by mouth 2 (two) times daily as needed for anxiety or sleep.   amLODipine 10 MG tablet Commonly known as: NORVASC Take 10 mg by mouth daily.   aspirin EC 81 MG tablet Take 81 mg by mouth daily.   atenolol 100 MG tablet Commonly known as: TENORMIN Take 100 mg by mouth 2 (two) times daily.   bumetanide 0.5 MG tablet Commonly known as: BUMEX Take 0.5 mg by mouth 2 (two) times daily.   calcitRIOL 0.25 MCG capsule Commonly known as: ROCALTROL Take 0.25 mcg by mouth daily.   cefTRIAXone  IVPB Commonly known as: ROCEPHIN Inject 2 g into the vein daily for 22 days. Indication: foot osteomyelitis Last Day of Therapy: 04/27/2020 Labs - Sunday/Monday:  CBC/D, CMP, CRP and vancomycin trough. Labs - Thursday:  BMP and vancomycin trough   Cinnamon 500 MG capsule Take 500 mg by mouth 2 (two) times daily.   cloNIDine 0.2 MG tablet Commonly known as: CATAPRES Take 0.2 mg by mouth 2 (two) times daily.   docusate sodium 100 MG capsule Commonly known as: COLACE Take 1 capsule (100 mg total) by mouth 2 (two) times daily.    enalapril 10 MG tablet Commonly known as: VASOTEC Take 20 mg by mouth 2 (two) times a day.   famotidine 20 MG tablet Commonly known as: PEPCID Take 20 mg by mouth 2 (two) times daily.   ferrous sulfate 325 (65 FE) MG tablet Take 325 mg by mouth daily with breakfast.   FLUoxetine 20 MG capsule Commonly known as: PROZAC Take 20 mg by mouth 2 (two) times daily.   folic acid 1 MG tablet Commonly known as: FOLVITE Take 1 tablet (1 mg total) by mouth daily. Start taking on: April 10, 2020   gabapentin 100 MG capsule Commonly known as: NEURONTIN TAKE 1 CAPSULE(100 MG) BY MOUTH AT BEDTIME   glyBURIDE 5 MG tablet Commonly known as: DIABETA Take 10 mg by mouth 2 (two) times daily with a meal.   Levemir FlexTouch 100 UNIT/ML FlexPen Generic drug: insulin detemir Inject 25 Units into the skin daily. What changed: how much to take   metroNIDAZOLE 500 MG tablet Commonly known as: Flagyl Take 1 tablet (500 mg total) by mouth 3 (three) times daily for 22 days.   NovoLOG FlexPen 100 UNIT/ML FlexPen Generic drug: insulin aspart Inject 5 Units into the skin 3 (three) times daily with meals. What changed:   how much to take  when to take this   ProAir HFA 108 (90 Base) MCG/ACT inhaler Generic drug: albuterol Inhale 2 puffs into the  lungs every 6 (six) hours as needed for wheezing or shortness of breath.   senna 8.6 MG Tabs tablet Commonly known as: SENOKOT Take 1 tablet (8.6 mg total) by mouth 2 (two) times daily as needed for mild constipation or moderate constipation.   simvastatin 20 MG tablet Commonly known as: ZOCOR Take 20 mg by mouth every evening.   vancomycin  IVPB Inject 1,500 mg into the vein every other day for 21 days. Indication: foot osteomyelitis Last Day of Therapy: 04/27/2020 Labs - Sunday/Monday:  CBC/D, CMP, CRP and vancomycin trough. Labs - Thursday:  BMP and vancomycin trough            Home Infusion Instuctions  (From admission, onward)          Start     Ordered   04/06/20 0000  Home infusion instructions       Question:  Instructions  Answer:  Flushing of vascular access device: 0.9% NaCl pre/post medication administration and prn patency; Heparin 100 u/ml, 60ml for implanted ports and Heparin 10u/ml, 85ml for all other central venous catheters.   04/06/20 9935           Discharge Care Instructions  (From admission, onward)         Start     Ordered   04/09/20 0000  Discharge wound care:       Comments: Dressing changes with betadine dressing changes daily.  Should maintain NWB to left foot.  F/U with podiatry Dr. Vickki Muff in outpt clinic in 2-3 weeks.   Dr. Enzo Bi - -   04/09/20 1032          Vitals:   04/09/20 0502 04/09/20 0810  BP: (!) 155/81 (!) 160/97  Pulse: 75 73  Resp: 14   Temp: 98.3 F (36.8 C)   SpO2: 97%     [name]

## 2020-04-09 NOTE — TOC Transition Note (Signed)
Transition of Care [facility]) - CM/SW Discharge Note   Patient Details  Name: [name] MRN: [id number] Date of Birth: 31-Dec-1955  [facility]) CM/SW Contact:  Candie Chroman, LCSW Phone Number: 04/09/2020, 12:57 PM   Clinical Narrative:  Patient has orders to discharge to [facility] today. RN will call report to [phone] (Room 712). EMS transport has been set up. Patient is second on the list. No further concerns. CSW signing off.   Final next level of care: Skilled Nursing Facility Barriers to Discharge: Barriers Resolved   Patient Goals and CMS Choice Patient states their goals for this hospitalization and ongoing recovery are:: To get back home and be independant like I was CMS Medicare.gov Compare Post Acute Care list provided to:: Patient Choice offered to / list presented to : Patient  Discharge Placement PASRR number recieved: 04/01/20            Patient chooses bed at: [facility] [place] Patient to be transferred to facility by: EMS   Patient and family notified of of transfer: 04/09/20  Discharge Plan and Services In-house Referral: NA Discharge Planning Services: NA Post Acute Care Choice: Home Health          DME Arranged: N/A DME Agency: [facility]       HH Arranged: NA [place] Agency: [facility] (formerly [employer])        Social Determinants of Health ([place]) Interventions     Readmission Risk Interventions Readmission Risk Prevention Plan 04/01/2020  Transportation Screening Complete  Medication Review Press photographer) Complete  PCP or Specialist appointment within 3-5 days of discharge Complete  HRI or [place] Complete  SW Recovery Care/Counseling Consult Complete  [place] Not Applicable  Some recent data might be hidden

## 2020-04-09 NOTE — TOC Progression Note (Addendum)
Transition of Care [facility]) - Progression Note    Patient Details  Name: [name] MRN: [id number] Date of Birth: [birth year]  Transition of Care [facility]) CM/SW Contact  Candie Chroman, LCSW Phone Number: 04/09/2020, 10:19 AM  Clinical Narrative:  CSW spoke to [facility] admissions coordinator. She confirmed patient has insurance approval and they do have a bed available today (Room 712). Sent secure chat to MD to notify. Patient will not need a new COVID test if she discharges today.   Expected Discharge Plan: [place] Barriers to Discharge: No Barriers Identified  Expected Discharge Plan and Services Expected Discharge Plan: [place] In-house Referral: NA Discharge Planning Services: NA Post Acute Care Choice: [place] arrangements for the past 2 months: Single Family Home Expected Discharge Date: 04/06/20               DME Arranged: N/A DME Agency: [facility]       HH Arranged: NA HH Agency: [facility] ([facility] [employer])         Social Determinants of Health ([place]) Interventions    Readmission Risk Interventions Readmission Risk Prevention Plan 04/01/2020  Transportation Screening Complete  Medication Review Press photographer) Complete  PCP or Specialist appointment within 3-5 days of discharge Complete  HRI or [place] Complete  SW Recovery Care/Counseling Consult Complete  [place] Not Applicable  Some recent data might be hidden

## 2020-04-09 NOTE — Care Management Important Message (Signed)
Important Message  Patient Details  Name: [name] MRN: [id number] Date of Birth: 11-28-1955   Medicare Important Message Given:  Other (see comment)  Attempted to review Medicare IM with patient via room phone, however no answer.  Has discharge order in.  Previously reviewed with patient on 9/3.     Dannette Barbara 04/09/2020, 1:42 PM

## 2020-04-10 ENCOUNTER — Ambulatory Visit: Payer: Medicare HMO | Admitting: Physician Assistant

## 2020-04-10 NOTE — Telephone Encounter (Signed)
Lab orders entered

## 2020-04-10 NOTE — Addendum Note (Signed)
Addended by: Gloris Ham on: 04/10/2020 08:54 AM   Modules accepted: Orders

## 2020-04-11 DIAGNOSIS — I10 Essential (primary) hypertension: Secondary | ICD-10-CM | POA: Diagnosis not present

## 2020-04-11 DIAGNOSIS — N189 Chronic kidney disease, unspecified: Secondary | ICD-10-CM | POA: Diagnosis not present

## 2020-04-11 DIAGNOSIS — D649 Anemia, unspecified: Secondary | ICD-10-CM | POA: Diagnosis not present

## 2020-04-11 DIAGNOSIS — C7981 Secondary malignant neoplasm of breast: Secondary | ICD-10-CM | POA: Diagnosis not present

## 2020-04-11 DIAGNOSIS — E119 Type 2 diabetes mellitus without complications: Secondary | ICD-10-CM | POA: Diagnosis not present

## 2020-04-11 DIAGNOSIS — Z794 Long term (current) use of insulin: Secondary | ICD-10-CM | POA: Diagnosis not present

## 2020-04-11 DIAGNOSIS — F418 Other specified anxiety disorders: Secondary | ICD-10-CM | POA: Diagnosis not present

## 2020-04-11 DIAGNOSIS — M86172 Other acute osteomyelitis, left ankle and foot: Secondary | ICD-10-CM | POA: Diagnosis not present

## 2020-04-12 ENCOUNTER — Other Ambulatory Visit: Payer: Self-pay

## 2020-04-12 DIAGNOSIS — Z170 Estrogen receptor positive status [ER+]: Secondary | ICD-10-CM

## 2020-04-17 DIAGNOSIS — M86172 Other acute osteomyelitis, left ankle and foot: Secondary | ICD-10-CM | POA: Diagnosis not present

## 2020-04-17 DIAGNOSIS — D649 Anemia, unspecified: Secondary | ICD-10-CM | POA: Diagnosis not present

## 2020-04-17 DIAGNOSIS — Z794 Long term (current) use of insulin: Secondary | ICD-10-CM | POA: Diagnosis not present

## 2020-04-17 DIAGNOSIS — D631 Anemia in chronic kidney disease: Secondary | ICD-10-CM | POA: Diagnosis not present

## 2020-04-17 DIAGNOSIS — I10 Essential (primary) hypertension: Secondary | ICD-10-CM | POA: Diagnosis not present

## 2020-04-17 DIAGNOSIS — N189 Chronic kidney disease, unspecified: Secondary | ICD-10-CM | POA: Diagnosis not present

## 2020-04-17 DIAGNOSIS — E119 Type 2 diabetes mellitus without complications: Secondary | ICD-10-CM | POA: Diagnosis not present

## 2020-04-17 DIAGNOSIS — N185 Chronic kidney disease, stage 5: Secondary | ICD-10-CM | POA: Diagnosis not present

## 2020-04-17 DIAGNOSIS — F418 Other specified anxiety disorders: Secondary | ICD-10-CM | POA: Diagnosis not present

## 2020-04-17 DIAGNOSIS — N2581 Secondary hyperparathyroidism of renal origin: Secondary | ICD-10-CM | POA: Diagnosis not present

## 2020-04-17 DIAGNOSIS — K5900 Constipation, unspecified: Secondary | ICD-10-CM | POA: Diagnosis not present

## 2020-04-17 DIAGNOSIS — R809 Proteinuria, unspecified: Secondary | ICD-10-CM | POA: Diagnosis not present

## 2020-04-17 DIAGNOSIS — C7981 Secondary malignant neoplasm of breast: Secondary | ICD-10-CM | POA: Diagnosis not present

## 2020-04-17 DIAGNOSIS — E1122 Type 2 diabetes mellitus with diabetic chronic kidney disease: Secondary | ICD-10-CM | POA: Diagnosis not present

## 2020-04-17 DIAGNOSIS — I120 Hypertensive chronic kidney disease with stage 5 chronic kidney disease or end stage renal disease: Secondary | ICD-10-CM | POA: Diagnosis not present

## 2020-04-19 DIAGNOSIS — M79672 Pain in left foot: Secondary | ICD-10-CM | POA: Diagnosis not present

## 2020-04-19 DIAGNOSIS — M86172 Other acute osteomyelitis, left ankle and foot: Secondary | ICD-10-CM | POA: Diagnosis not present

## 2020-04-23 ENCOUNTER — Inpatient Hospital Stay (HOSPITAL_BASED_OUTPATIENT_CLINIC_OR_DEPARTMENT_OTHER): Payer: Medicare HMO | Admitting: Internal Medicine

## 2020-04-23 ENCOUNTER — Other Ambulatory Visit: Payer: Self-pay

## 2020-04-23 ENCOUNTER — Inpatient Hospital Stay: Payer: Medicare HMO | Attending: Internal Medicine

## 2020-04-23 DIAGNOSIS — J45909 Unspecified asthma, uncomplicated: Secondary | ICD-10-CM | POA: Insufficient documentation

## 2020-04-23 DIAGNOSIS — Z803 Family history of malignant neoplasm of breast: Secondary | ICD-10-CM | POA: Insufficient documentation

## 2020-04-23 DIAGNOSIS — Z833 Family history of diabetes mellitus: Secondary | ICD-10-CM | POA: Diagnosis not present

## 2020-04-23 DIAGNOSIS — Z87891 Personal history of nicotine dependence: Secondary | ICD-10-CM | POA: Diagnosis not present

## 2020-04-23 DIAGNOSIS — F329 Major depressive disorder, single episode, unspecified: Secondary | ICD-10-CM | POA: Diagnosis not present

## 2020-04-23 DIAGNOSIS — Z800 Family history of malignant neoplasm of digestive organs: Secondary | ICD-10-CM | POA: Diagnosis not present

## 2020-04-23 DIAGNOSIS — Z923 Personal history of irradiation: Secondary | ICD-10-CM | POA: Insufficient documentation

## 2020-04-23 DIAGNOSIS — N184 Chronic kidney disease, stage 4 (severe): Secondary | ICD-10-CM | POA: Diagnosis not present

## 2020-04-23 DIAGNOSIS — C7951 Secondary malignant neoplasm of bone: Secondary | ICD-10-CM | POA: Diagnosis not present

## 2020-04-23 DIAGNOSIS — C50212 Malignant neoplasm of upper-inner quadrant of left female breast: Secondary | ICD-10-CM

## 2020-04-23 DIAGNOSIS — I509 Heart failure, unspecified: Secondary | ICD-10-CM | POA: Insufficient documentation

## 2020-04-23 DIAGNOSIS — I110 Hypertensive heart disease with heart failure: Secondary | ICD-10-CM | POA: Insufficient documentation

## 2020-04-23 DIAGNOSIS — Z8249 Family history of ischemic heart disease and other diseases of the circulatory system: Secondary | ICD-10-CM | POA: Insufficient documentation

## 2020-04-23 DIAGNOSIS — Z7982 Long term (current) use of aspirin: Secondary | ICD-10-CM | POA: Insufficient documentation

## 2020-04-23 DIAGNOSIS — Z5111 Encounter for antineoplastic chemotherapy: Secondary | ICD-10-CM | POA: Diagnosis not present

## 2020-04-23 DIAGNOSIS — D649 Anemia, unspecified: Secondary | ICD-10-CM | POA: Diagnosis not present

## 2020-04-23 DIAGNOSIS — Z170 Estrogen receptor positive status [ER+]: Secondary | ICD-10-CM

## 2020-04-23 DIAGNOSIS — Z8349 Family history of other endocrine, nutritional and metabolic diseases: Secondary | ICD-10-CM | POA: Diagnosis not present

## 2020-04-23 DIAGNOSIS — Z801 Family history of malignant neoplasm of trachea, bronchus and lung: Secondary | ICD-10-CM | POA: Diagnosis not present

## 2020-04-23 DIAGNOSIS — Z7951 Long term (current) use of inhaled steroids: Secondary | ICD-10-CM | POA: Insufficient documentation

## 2020-04-23 DIAGNOSIS — F419 Anxiety disorder, unspecified: Secondary | ICD-10-CM | POA: Diagnosis not present

## 2020-04-23 DIAGNOSIS — Z79899 Other long term (current) drug therapy: Secondary | ICD-10-CM | POA: Diagnosis not present

## 2020-04-23 LAB — CBC WITH DIFFERENTIAL/PLATELET
Abs Immature Granulocytes: 0.03 10*3/uL (ref 0.00–0.07)
Basophils Absolute: 0 10*3/uL (ref 0.0–0.1)
Basophils Relative: 0 %
Eosinophils Absolute: 0.1 10*3/uL (ref 0.0–0.5)
Eosinophils Relative: 2 %
HCT: 32.1 % — ABNORMAL LOW (ref 36.0–46.0)
Hemoglobin: 10.8 g/dL — ABNORMAL LOW (ref 12.0–15.0)
Immature Granulocytes: 0 %
Lymphocytes Relative: 12 %
Lymphs Abs: 0.8 10*3/uL (ref 0.7–4.0)
MCH: 30.3 pg (ref 26.0–34.0)
MCHC: 33.6 g/dL (ref 30.0–36.0)
MCV: 89.9 fL (ref 80.0–100.0)
Monocytes Absolute: 0.7 10*3/uL (ref 0.1–1.0)
Monocytes Relative: 10 %
Neutro Abs: 5.2 10*3/uL (ref 1.7–7.7)
Neutrophils Relative %: 76 %
Platelets: 339 10*3/uL (ref 150–400)
RBC: 3.57 MIL/uL — ABNORMAL LOW (ref 3.87–5.11)
RDW: 15.9 % — ABNORMAL HIGH (ref 11.5–15.5)
WBC: 6.9 10*3/uL (ref 4.0–10.5)
nRBC: 0 % (ref 0.0–0.2)

## 2020-04-23 LAB — COMPREHENSIVE METABOLIC PANEL
ALT: 12 U/L (ref 0–44)
AST: 17 U/L (ref 15–41)
Albumin: 3.2 g/dL — ABNORMAL LOW (ref 3.5–5.0)
Alkaline Phosphatase: 65 U/L (ref 38–126)
Anion gap: 7 (ref 5–15)
BUN: 26 mg/dL — ABNORMAL HIGH (ref 8–23)
CO2: 25 mmol/L (ref 22–32)
Calcium: 8.5 mg/dL — ABNORMAL LOW (ref 8.9–10.3)
Chloride: 104 mmol/L (ref 98–111)
Creatinine, Ser: 1.88 mg/dL — ABNORMAL HIGH (ref 0.44–1.00)
GFR calc Af Amer: 32 mL/min — ABNORMAL LOW (ref >60)
GFR calc non Af Amer: 28 mL/min — ABNORMAL LOW (ref >60)
Glucose, Bld: 248 mg/dL — ABNORMAL HIGH (ref 70–99)
Potassium: 4.6 mmol/L (ref 3.5–5.1)
Sodium: 136 mmol/L (ref 135–145)
Total Bilirubin: 0.5 mg/dL (ref 0.3–1.2)
Total Protein: 7.4 g/dL (ref 6.5–8.1)

## 2020-04-23 LAB — C-REACTIVE PROTEIN: CRP: <0.5 mg/dL (ref <1.0)

## 2020-04-23 NOTE — Progress Notes (Signed)
Patient here for follow-up for breast cancer. picc line located in left chest wall last flushed on 9/13 by [facility]. Last port flush 03/16/2020.

## 2020-04-23 NOTE — Progress Notes (Signed)
[facility] OFFICE PROGRESS NOTE  Patient Care Team: [name], [name], MD as Medical Oncologist (Medical Oncology) Corey Skains, MD as Consulting Physician (Cardiology)  Cancer Staging No matching staging information was found for the patient.   Oncology History Overview Note  # OCT 2015-STAGE IV LEFT BREAST T2N1 [T=4cm; [name]; [name]; [name]; EBUS- Positive Paratrac/subcarinal LN s/p ? Taxotere [in [place]; [name]; [name];[name]; [name]; suv 3]; [name]; [name]; [name];  [name]; Ant R iliac bone; [name]; [name]; START-Taxol weekly. March 2020- Taxol every 2 weeks [PN]; SEP 2020- PET progression  # SEP 04/29/2019- ERIBULIN s/p RT - Right hip- [s/p RT- NOV 2020]  # Poorly controlled Blood sugars- improved.   # Pancreatitis Hx/PEI- on creon in past / CKD IV [creat ~ 3-4; [name]; [name]; mild left sided weakness]  # Jan 2020-  Lobular lesion on tongue- s/p excision pyogenic granuloma [Dr.McQueen]   # GENETIC TESTING/COUNSELLING: HETEROZYGOUS Cystic Fibrosis Gene [explains hx of recurrent pancreatitis]  # MOLECULAR TESTING: NA   # PALLIATIVE CARE: 1/22-Discussed/Declined ------------------------------------------------   DIAGNOSIS: [ 2015] BREAST CA; [name]; [name] ;GOALS: Palliative  CURRENT/MOST RECENT THERAPY: ERIBULIN [C].     Carcinoma of upper-inner quadrant of left  breast in female, estrogen receptor positive ([place])  04/29/2019 - 02/24/2020 Chemotherapy   The patient had eriBULin mesylate (HALAVEN) 2 mg in sodium chloride 0.9 % 100 mL chemo infusion, 2 mg, Intravenous,  Once, 12 of 14 cycles Dose modification: 1 mg/m2 (original dose 1 mg/m2, Cycle 1, Reason: Provider Judgment) Administration: 2 mg (06/03/2019), 2 mg (04/29/2019), 2 mg (06/10/2019), 2 mg (07/04/2019), 2 mg (07/11/2019), 2 mg (07/25/2019), 2 mg (08/03/2019), 2 mg (08/19/2019), 2 mg (08/26/2019), 2 mg (09/09/2019), 2 mg (09/16/2019), 2 mg (10/07/2019), 2 mg (10/14/2019), 2 mg (10/28/2019), 2 mg (11/04/2019), 2 mg (11/28/2019), 2 mg (12/09/2019), 2 mg (01/13/2020), 2 mg (01/20/2020), 2 mg (02/02/2020), 2 mg (02/13/2020), 2 mg (02/24/2020)  for chemotherapy treatment.    04/30/2020 -  Chemotherapy   The patient had gemcitabine (GEMZAR) 1,596 mg in sodium chloride 0.9 % 250 mL chemo infusion, 800 mg/m2, Intravenous,  Once, 0 of 4 cycles  for chemotherapy treatment.     INTERVAL HISTORY:  [name] 64 y.o.  female pleasant patient above history of metastatic ER PR positive HER-2 negative breast cancer; CKD stage IV-most recently progressed on eribulin  is here for follow-up.    In the interim patient was admitted to hospital for left foot/fifth metatarsal osteomyelitis/cellulitis.  Patient underwent amputation of the left foot fifth meta tarsal.  Patient currently on long-term antibiotics/tunnel catheter; until 9/24th.   Patient continues to be in rehab; overall doing well.  Continues get physical therapy.   No nausea no vomiting.  Appetite is improving.  No worsening swelling in the legs.  No headaches.  In the interim had met with Dr. Vickki Muff podiatry last week.  Awaiting appointment with Dr. Ola Spurr this week.  Review of  Systems  Constitutional: Positive for malaise/fatigue. Negative for chills, diaphoresis, fever and weight loss.  HENT: Negative for nosebleeds and sore throat.   Eyes: Negative for double  vision.  Respiratory: Negative for cough, hemoptysis, sputum production, [name], palpitations and orthopnea.  Gastrointestinal: Negative for abdominal pain, blood in stool, constipation, diarrhea, heartburn, melena, nausea and vomiting.  Genitourinary: Negative for dysuria, frequency and urgency.  Musculoskeletal: Positive for back pain and joint pain.  Skin: Negative.  Negative for itching and rash.  Neurological: Positive for tingling. Negative for dizziness, focal weakness, weakness and headaches.  Endo/Heme/Allergies: Does not bruise/bleed easily.  Psychiatric/Behavioral: Negative for depression. The patient is not nervous/anxious and does not have insomnia.     PAST MEDICAL HISTORY :  Past Medical History:  Diagnosis Date  . Anemia   . Anxiety   . Asthma   . Cancer ([place]) 03/10/2018   Per [facility] PET order. Carcinoma of upper-inner quadrant of left breast in female, estrogen receptor positive .  Marland Kitchen Cancer (HCC)    LUNG  . CHF (congestive heart failure) ([place]) 1997  . CKD (chronic kidney disease)   . Depression   . Diabetes mellitus, type 2 ([place])   . Family history of breast cancer   . Family history of colon cancer   . Family history of ovarian cancer   . Family history of pancreatic cancer   . Family history of prostate cancer   . Family history of stomach cancer   . GERD (gastroesophageal reflux disease)    history of an ulcer  . Hair loss   . History of left breast cancer 05/29/14  . History of partial hysterectomy 12/31/2016   Per patient.  Has not had a period in years.  Had a partial hysterectomy years ago.  Marland Kitchen Hypertension   . Mitral valve regurgitation   . Neuromuscular disorder (HCC)    neuropathies in hand  . Obesity   . Pancreatitis 1997  . Stroke [facility]) 2010   with mild left arm weakness    PAST SURGICAL HISTORY :   Past Surgical History:  Procedure Laterality Date  . AMPUTATION Left  03/30/2020   Procedure: AMPUTATION 5th RAY;  Surgeon: Samara Deist, DPM;  Location: [facility] ORS;  Service: Podiatry;  Laterality: Left;  . APPLICATION OF WOUND VAC Left 03/30/2020   Procedure: APPLICATION OF WOUND VAC;  Surgeon: Samara Deist, DPM;  Location: [facility] ORS;  Service: Podiatry;  Laterality: Left;  . CATARACT EXTRACTION W/PHACO Right 02/24/2019   Procedure: CATARACT EXTRACTION PHACO AND INTRAOCULAR LENS PLACEMENT ([place]) RIGHT DIABETES;  Surgeon: Marchia Meiers, MD;  Location: [facility] ORS;  Service: Ophthalmology;  Laterality: Right;  [place] 01:13.0 CDE 7.96 Fluid Pack Lot # [id number]  . CATARACT EXTRACTION W/PHACO Left 03/24/2019   Procedure: CATARACT EXTRACTION PHACO AND INTRAOCULAR LENS PLACEMENT (IOC) - left diabetic;  Surgeon: Marchia Meiers, MD;  Location: [facility] ORS;  Service: Ophthalmology;  Laterality: Left;  [place]  01:36 CDE 13.93 Fluid pack lot # [id number]  . CENTRAL LINE INSERTION-TUNNELED N/A 04/04/2020   Procedure: CENTRAL LINE INSERTION-TUNNELED;  Surgeon: Katha Cabal, MD;  Location: [place] CV LAB;  Service: Cardiovascular;  Laterality: N/A;  . CESAREAN SECTION    . CHOLECYSTECTOMY    . EXCISION OF TONGUE LESION N/A 08/17/2018   Procedure: EXCISION OF TONGUE LESION WITH FROZEN SECTIONS;  Surgeon: Beverly Gust, MD;  Location: [facility] ORS;  Service: ENT;  Laterality: N/A;  . EYE SURGERY  Right    cataract extraction  . IRRIGATION AND DEBRIDEMENT FOOT Left 03/30/2020   Procedure: IRRIGATION AND DEBRIDEMENT FOOT;  Surgeon: Samara Deist, DPM;  Location: [facility] ORS;  Service: Podiatry;  Laterality: Left;  . PARTIAL HYSTERECTOMY  12/31/2016   Per patient, she has not had a period in years since she had a partial hysterectomy.  Marland Kitchen PORTA CATH INSERTION    . TUBAL LIGATION      FAMILY HISTORY :   Family History  Problem Relation Age of Onset  . Ovarian cancer Mother 73  . Diabetes Mother   . Hypertension Mother   . COPD Father   . Hypertension Father   . Colon cancer Father 16   . Diabetes Sister   . Breast cancer Sister 90       bilateral  . Diabetes Brother   . Leukemia Maternal Aunt   . Pancreatic cancer Paternal Aunt 64  . Pancreatic cancer Paternal Uncle   . Colon cancer Paternal Uncle   . Stomach cancer Maternal Grandfather 56  . Throat cancer Paternal Grandmother   . Breast cancer Maternal Aunt 80  . Colon cancer Maternal Aunt   . Bone cancer Maternal Aunt   . Breast cancer Paternal Aunt        dx >50  . Prostate cancer Paternal Uncle   . Pancreatic cancer Paternal Uncle   . Throat cancer Paternal Uncle   . Lung cancer Paternal Uncle   . Stomach cancer Paternal Uncle   . Brain cancer Paternal Aunt   . Cancer Cousin        liver, kidney  . Prostate cancer Cousin        meastatic  . Lung cancer Other     SOCIAL HISTORY:   Social History   Tobacco Use  . Smoking status: Former Smoker    Packs/day: 0.50    Years: 1.00    Pack years: 0.50    Types: Cigarettes  . Smokeless tobacco: Never Used  Vaping Use  . Vaping Use: Never used  Substance Use Topics  . Alcohol use: No    Alcohol/week: 0.0 standard drinks  . Drug use: No    ALLERGIES:  is allergic to chlorhexidine, fish-derived products, and sulfamethoxazole-trimethoprim.  MEDICATIONS:  Current Outpatient Medications  Medication Sig Dispense Refill  . acetaminophen-codeine (TYLENOL #4) 300-60 MG tablet Take 1 tablet by mouth 2 (two) times daily as needed for moderate pain.     Marland Kitchen albuterol (PROAIR HFA) 108 (90 BASE) MCG/ACT inhaler Inhale 2 puffs into the lungs every 6 (six) hours as needed for wheezing or shortness of breath.     . ALPRAZolam (XANAX) 0.5 MG tablet Take 0.5 mg by mouth 2 (two) times daily as needed for anxiety or sleep.     Marland Kitchen amLODipine (NORVASC) 10 MG tablet Take 10 mg by mouth daily.     Marland Kitchen aspirin EC 81 MG tablet Take 81 mg by mouth daily.     Marland Kitchen atenolol (TENORMIN) 100 MG tablet Take 100 mg by mouth 2 (two) times daily.     . bumetanide (BUMEX) 0.5 MG tablet Take  0.5 mg by mouth 2 (two) times daily.     . calcitRIOL (ROCALTROL) 0.25 MCG capsule Take 0.25 mcg by mouth daily.     . cefTRIAXone (ROCEPHIN) IVPB Inject 2 g into the vein daily for 22 days. Indication: foot osteomyelitis Last Day of Therapy: 04/27/2020 Labs - Sunday/Monday:  CBC/D, CMP, CRP and vancomycin trough. Labs - Thursday:  BMP and vancomycin trough 22 Units 0  . Cinnamon 500 MG capsule Take 500 mg by mouth 2 (two) times daily.     . cloNIDine (CATAPRES) 0.2 MG tablet Take 0.2 mg by mouth 2 (two) times daily.     Marland Kitchen docusate sodium (COLACE) 100 MG capsule Take 1 capsule (100 mg total) by mouth 2 (two) times daily. 10 capsule 0  . enalapril (VASOTEC) 10 MG tablet Take 20 mg by mouth 2 (two) times a day.     . famotidine (PEPCID) 20 MG tablet Take 20 mg by mouth 2 (two) times daily.     . ferrous sulfate 325 (65 FE) MG tablet Take 325 mg by mouth daily with breakfast.    . FLUoxetine (PROZAC) 20 MG capsule Take 20 mg by mouth 2 (two) times daily.     . folic acid (FOLVITE) 1 MG tablet Take 1 tablet (1 mg total) by mouth daily.    Marland Kitchen gabapentin (NEURONTIN) 100 MG capsule TAKE 1 CAPSULE(100 MG) BY MOUTH AT BEDTIME 30 capsule 6  . glyBURIDE (DIABETA) 5 MG tablet Take 10 mg by mouth 2 (two) times daily with a meal.     . LEVEMIR FLEXTOUCH 100 UNIT/ML FlexPen Inject 25 Units into the skin daily. 15 mL 11  . metroNIDAZOLE (FLAGYL) 500 MG tablet Take 1 tablet (500 mg total) by mouth 3 (three) times daily for 22 days. 66 tablet 0  . NOVOLOG FLEXPEN 100 UNIT/ML FlexPen Inject 5 Units into the skin 3 (three) times daily with meals. 15 mL 11  . senna (SENOKOT) 8.6 MG TABS tablet Take 1 tablet (8.6 mg total) by mouth 2 (two) times daily as needed for mild constipation or moderate constipation. 120 tablet 0  . simvastatin (ZOCOR) 20 MG tablet Take 20 mg by mouth every evening.     . vancomycin IVPB Inject 1,500 mg into the vein every other day for 21 days. Indication: foot osteomyelitis Last Day of  Therapy: 04/27/2020 Labs - Sunday/Monday:  CBC/D, CMP, CRP and vancomycin trough. Labs - Thursday:  BMP and vancomycin trough 12 Units 0   No current facility-administered medications for this visit.   Facility-Administered Medications Ordered in Other Visits  Medication Dose Route Frequency Provider Last Rate Last Admin  . sodium chloride flush (NS) 0.9 % injection 10 mL  10 mL Intravenous PRN Cammie Sickle, MD   10 mL at 01/30/16 1054    PHYSICAL EXAMINATION: ECOG PERFORMANCE STATUS: 1 - Symptomatic but completely ambulatory  BP (!) 166/104   Pulse 78   Temp (!) 96.8 F (36 C) (Tympanic)   Resp 20   Wt 201 lb (91.2 kg)   BMI 36.76 kg/m   Filed Weights   04/23/20 1000  Weight: 201 lb (91.2 kg)    Physical Exam Constitutional:      Comments: She is alone.  HENT:     Head: Normocephalic and atraumatic.     Mouth/Throat:     Pharynx: No oropharyngeal exudate.  Eyes:     Pupils: Pupils are equal, round, and reactive to light.  Cardiovascular:     Rate and Rhythm: Normal rate and regular rhythm.  Pulmonary:     Effort: No respiratory distress.     Breath sounds: Normal breath sounds. No wheezing.  Abdominal:     General: Bowel sounds are normal. There is no distension.     Palpations: Abdomen is soft. There is no mass.     Tenderness: There is no abdominal  tenderness. There is no guarding or rebound.  Musculoskeletal:        General: No tenderness. Normal range of motion.     Cervical back: Normal range of motion and neck supple.  Skin:    General: Skin is warm.     Comments: Left sole-ulceration noted/see picture below.  Neurological:     Mental Status: She is alert and oriented to person, place, and time.  Psychiatric:        Mood and Affect: Affect normal.     LABORATORY DATA:  I have reviewed the data as listed    Component Value Date/Time   NA 136 04/23/2020 0932   NA 130 (L) 06/06/2014 1102   K 4.6 04/23/2020 0932   K 3.9 06/06/2014 1102    CL 104 04/23/2020 0932   CL 95 (L) 06/06/2014 1102   CO2 25 04/23/2020 0932   CO2 28 06/06/2014 1102   GLUCOSE 248 (H) 04/23/2020 0932   GLUCOSE 349 (H) 06/06/2014 1102   BUN 26 (H) 04/23/2020 0932   BUN 17 06/06/2014 1102   CREATININE 1.88 (H) 04/23/2020 0932   CREATININE 1.63 (H) 06/06/2014 1102   CALCIUM 8.5 (L) 04/23/2020 0932   CALCIUM 9.2 06/06/2014 1102   PROT 7.4 04/23/2020 0932   PROT 8.2 06/06/2014 1102   ALBUMIN 3.2 (L) 04/23/2020 0932   ALBUMIN 3.3 (L) 06/06/2014 1102   AST 17 04/23/2020 0932   AST 7 (L) 06/06/2014 1102   ALT 12 04/23/2020 0932   ALT 12 (L) 06/06/2014 1102   ALKPHOS 65 04/23/2020 0932   ALKPHOS 74 06/06/2014 1102   BILITOT 0.5 04/23/2020 0932   BILITOT 0.4 06/06/2014 1102   GFRNONAA 28 (L) 04/23/2020 0932   GFRNONAA 35 (L) 06/06/2014 1102   GFRAA 32 (L) 04/23/2020 0932   GFRAA 42 (L) 06/06/2014 1102    No results found for: SPEP, UPEP  Lab Results  Component Value Date   WBC 6.9 04/23/2020   NEUTROABS 5.2 04/23/2020   HGB 10.8 (L) 04/23/2020   HCT 32.1 (L) 04/23/2020   MCV 89.9 04/23/2020   PLT 339 04/23/2020      Chemistry      Component Value Date/Time   NA 136 04/23/2020 0932   NA 130 (L) 06/06/2014 1102   K 4.6 04/23/2020 0932   K 3.9 06/06/2014 1102   CL 104 04/23/2020 0932   CL 95 (L) 06/06/2014 1102   CO2 25 04/23/2020 0932   CO2 28 06/06/2014 1102   BUN 26 (H) 04/23/2020 0932   BUN 17 06/06/2014 1102   CREATININE 1.88 (H) 04/23/2020 0932   CREATININE 1.63 (H) 06/06/2014 1102      Component Value Date/Time   CALCIUM 8.5 (L) 04/23/2020 0932   CALCIUM 9.2 06/06/2014 1102   ALKPHOS 65 04/23/2020 0932   ALKPHOS 74 06/06/2014 1102   AST 17 04/23/2020 0932   AST 7 (L) 06/06/2014 1102   ALT 12 04/23/2020 0932   ALT 12 (L) 06/06/2014 1102   BILITOT 0.5 04/23/2020 0932   BILITOT 0.4 06/06/2014 1102         RADIOGRAPHIC STUDIES: I have personally reviewed the radiological images as listed and agreed with the  findings in the report. No results found.   ASSESSMENT & PLAN:  Carcinoma of upper-inner quadrant of left breast in female, estrogen receptor positive ([place]) Left breast cancer- stage IV- ER/PR +, HER-2/neu. Most recently on eribulin [renally dosed [start at 1 mg/m] started on-04/29/2019.  PET AUG 2021-shows  progressive lung lesion/pleural-based; bone lesions; consistent with rising tumor marker.   #Discontinue eribulin; however further chemotherapy has been delayed because of infection issues/see below. Proceed with Gemcitabine next week.  Patient understands goal of treatment is palliative not curative.  Understands response rates are anywhere between between 20 to 30%.  # Left foot infection/ osteomyelitis- s/p amputation of left 5th metatarsal. On IV vanc until 9/24; awaiting appt with Dr.Fitzgerald.   # Bone mets-sclerotic; Right acetabular uptake-s/p radiation. Hypocalcemia-ca-8.3/low vitD- on vit D daily.STABLE.   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   #Knee pain arthritis-causing gait instability; awaiting arthroscopic surgery-currently on hold because of progressive malignancy/leg infection.- STABLE.   # Chronic kidney disease - stage III-IV-GFR 32 STABLE;: [[name]  # Anemia- hemoglobin today~10; /CKD-STABLE.  On PO iron.  # DISPOSITION:  #  Follow up in 1 week; MD; labs- cbc/cmp;Gemcitabine- Dr.B   No orders of the defined types were placed in this encounter.  All questions were answered. The patient knows to call the clinic with any problems, questions or concerns.      Cammie Sickle, MD 04/23/2020 4:04 PM

## 2020-04-23 NOTE — Progress Notes (Signed)
DISCONTINUE ON PATHWAY REGIMEN - Breast     A cycle is every 21 days:     Eribulin mesylate   **Always confirm dose/schedule in your pharmacy ordering system**  REASON: Disease Progression PRIOR TREATMENT: [id number]: Eribulin 1.4 mg/m2 D1, 8 q21 Days TREATMENT RESPONSE: Progressive Disease (PD)  START ON PATHWAY REGIMEN - Breast     A cycle is every 21 days:     Gemcitabine   **Always confirm dose/schedule in your pharmacy ordering system**  Patient Characteristics: Distant Metastases or Locoregional Recurrent Disease - Unresected or Locally Advanced Unresectable Disease Progressing after Neoadjuvant and Local Therapies, HER2 Negative/Unknown/Equivocal, ER Positive, Chemotherapy, Third Line and Beyond, Prior or  Contraindicated Anthracycline and Prior Eribulin Therapeutic Status: Distant Metastases ER Status: Positive (+) HER2 Status: Negative (-) PR Status: Positive (+) Therapy Approach Indicated: Standard Chemotherapy/Endocrine Therapy Line of Therapy: Third Line and Beyond Intent of Therapy: Non-Curative / Palliative Intent, Discussed with Patient

## 2020-04-23 NOTE — Assessment & Plan Note (Addendum)
Left breast cancer- stage IV- ER/PR +, HER-2/neu. Most recently on eribulin [renally dosed [start at 1 mg/m] started on-04/29/2019.  PET AUG 2021-shows progressive lung lesion/pleural-based; bone lesions; consistent with rising tumor marker.   #Discontinue eribulin; however further chemotherapy has been delayed because of infection issues/see below. Proceed with Gemcitabine next week.  Patient understands goal of treatment is palliative not curative.  Understands response rates are anywhere between between 20 to 30%.  # Left foot infection/ osteomyelitis- s/p amputation of left 5th metatarsal. On IV vanc until 9/24; awaiting appt with Dr.Fitzgerald.   # Bone mets-sclerotic; Right acetabular uptake-s/p radiation. Hypocalcemia-ca-8.3/low vitD- on vit D daily.STABLE.   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   #Knee pain arthritis-causing gait instability; awaiting arthroscopic surgery-currently on hold because of progressive malignancy/leg infection.- STABLE.   # Chronic kidney disease - stage III-IV-GFR 32 STABLE;: [[name]  # Anemia- hemoglobin today~10; /CKD-STABLE.  On PO iron.  # DISPOSITION:  #  Follow up in 1 week; MD; labs- cbc/cmp;Gemcitabine- Dr.B

## 2020-04-23 NOTE — Progress Notes (Signed)
ON PATHWAY REGIMEN - Breast  No Change  Continue With Treatment as Ordered.  Original Decision Date/Time: 04/19/2019 07:15     A cycle is every 21 days:     Eribulin mesylate   **Always confirm dose/schedule in your pharmacy ordering system**  Patient Characteristics: Distant Metastases or Locoregional Recurrent Disease - Unresected or Locally Advanced Unresectable Disease Progressing after Neoadjuvant and Local Therapies, HER2 Negative/Unknown/Equivocal, ER Positive, Chemotherapy, Second Line Therapeutic Status: Distant Metastases BRCA Mutation Status: Awaiting Test Results ER Status: Positive (+) HER2 Status: Negative (-) PR Status: Positive (+) Line of Therapy: Second Line Intent of Therapy: Non-Curative / Palliative Intent, Discussed with Patient

## 2020-04-24 LAB — CANCER ANTIGEN 27.29: CA 27.29: 218.1 U/mL — ABNORMAL HIGH (ref 0.0–38.6)

## 2020-04-24 LAB — CEA: CEA: 29.7 ng/mL — ABNORMAL HIGH (ref 0.0–4.7)

## 2020-04-24 LAB — CANCER ANTIGEN 15-3: CA 15-3: 135 U/mL — ABNORMAL HIGH (ref 0.0–25.0)

## 2020-04-25 DIAGNOSIS — L089 Local infection of the skin and subcutaneous tissue, unspecified: Secondary | ICD-10-CM | POA: Diagnosis not present

## 2020-04-25 DIAGNOSIS — E11628 Type 2 diabetes mellitus with other skin complications: Secondary | ICD-10-CM | POA: Diagnosis not present

## 2020-04-25 DIAGNOSIS — M86172 Other acute osteomyelitis, left ankle and foot: Secondary | ICD-10-CM | POA: Diagnosis not present

## 2020-04-25 DIAGNOSIS — Z792 Long term (current) use of antibiotics: Secondary | ICD-10-CM | POA: Diagnosis not present

## 2020-04-26 ENCOUNTER — Telehealth (INDEPENDENT_AMBULATORY_CARE_PROVIDER_SITE_OTHER): Payer: Self-pay

## 2020-04-26 DIAGNOSIS — N189 Chronic kidney disease, unspecified: Secondary | ICD-10-CM | POA: Diagnosis not present

## 2020-04-26 DIAGNOSIS — M86172 Other acute osteomyelitis, left ankle and foot: Secondary | ICD-10-CM | POA: Diagnosis not present

## 2020-04-26 DIAGNOSIS — C7981 Secondary malignant neoplasm of breast: Secondary | ICD-10-CM | POA: Diagnosis not present

## 2020-04-26 DIAGNOSIS — F418 Other specified anxiety disorders: Secondary | ICD-10-CM | POA: Diagnosis not present

## 2020-04-26 DIAGNOSIS — E119 Type 2 diabetes mellitus without complications: Secondary | ICD-10-CM | POA: Diagnosis not present

## 2020-04-26 DIAGNOSIS — I10 Essential (primary) hypertension: Secondary | ICD-10-CM | POA: Diagnosis not present

## 2020-04-26 NOTE — Telephone Encounter (Signed)
Spoke with the patient and she is scheduled with Dr. Delana Meyer for a H[place]man cath removal on 05/01/20 with a 1:30 pm arrival time to the [facility]. Covid testing on 04/27/20 between 8-1 pm at the [facility]. Pre-procedure instructions were discussed and will be mailed.

## 2020-04-27 ENCOUNTER — Other Ambulatory Visit: Payer: Self-pay

## 2020-04-27 ENCOUNTER — Other Ambulatory Visit
Admission: RE | Admit: 2020-04-27 | Discharge: 2020-04-27 | Disposition: A | Discharge status: Home / Self Care | Payer: Medicare HMO | Source: Ambulatory Visit | Attending: Vascular Surgery | Admitting: Vascular Surgery

## 2020-04-27 DIAGNOSIS — Z01812 Encounter for preprocedural laboratory examination: Secondary | ICD-10-CM | POA: Insufficient documentation

## 2020-04-27 DIAGNOSIS — Z20822 Contact with and (suspected) exposure to covid-19: Secondary | ICD-10-CM | POA: Insufficient documentation

## 2020-04-28 LAB — SARS CORONAVIRUS 2 (TAT 6-24 HRS): SARS Coronavirus 2: NEGATIVE

## 2020-04-30 ENCOUNTER — Inpatient Hospital Stay (HOSPITAL_BASED_OUTPATIENT_CLINIC_OR_DEPARTMENT_OTHER): Payer: Medicare HMO | Admitting: Internal Medicine

## 2020-04-30 ENCOUNTER — Inpatient Hospital Stay: Payer: Medicare HMO

## 2020-04-30 ENCOUNTER — Encounter: Payer: Self-pay | Admitting: Internal Medicine

## 2020-04-30 ENCOUNTER — Other Ambulatory Visit: Payer: Self-pay

## 2020-04-30 DIAGNOSIS — F329 Major depressive disorder, single episode, unspecified: Secondary | ICD-10-CM | POA: Diagnosis not present

## 2020-04-30 DIAGNOSIS — Z8673 Personal history of transient ischemic attack (TIA), and cerebral infarction without residual deficits: Secondary | ICD-10-CM | POA: Diagnosis not present

## 2020-04-30 DIAGNOSIS — E11621 Type 2 diabetes mellitus with foot ulcer: Secondary | ICD-10-CM | POA: Diagnosis not present

## 2020-04-30 DIAGNOSIS — Z7189 Other specified counseling: Secondary | ICD-10-CM

## 2020-04-30 DIAGNOSIS — C7951 Secondary malignant neoplasm of bone: Secondary | ICD-10-CM | POA: Diagnosis not present

## 2020-04-30 DIAGNOSIS — Z170 Estrogen receptor positive status [ER+]: Secondary | ICD-10-CM | POA: Diagnosis not present

## 2020-04-30 DIAGNOSIS — I509 Heart failure, unspecified: Secondary | ICD-10-CM | POA: Diagnosis not present

## 2020-04-30 DIAGNOSIS — M8610 Other acute osteomyelitis, unspecified site: Secondary | ICD-10-CM | POA: Diagnosis not present

## 2020-04-30 DIAGNOSIS — Z5111 Encounter for antineoplastic chemotherapy: Secondary | ICD-10-CM | POA: Diagnosis not present

## 2020-04-30 DIAGNOSIS — C50212 Malignant neoplasm of upper-inner quadrant of left female breast: Secondary | ICD-10-CM | POA: Diagnosis not present

## 2020-04-30 DIAGNOSIS — R262 Difficulty in walking, not elsewhere classified: Secondary | ICD-10-CM | POA: Diagnosis not present

## 2020-04-30 DIAGNOSIS — D649 Anemia, unspecified: Secondary | ICD-10-CM | POA: Diagnosis not present

## 2020-04-30 DIAGNOSIS — I110 Hypertensive heart disease with heart failure: Secondary | ICD-10-CM | POA: Diagnosis not present

## 2020-04-30 DIAGNOSIS — M6281 Muscle weakness (generalized): Secondary | ICD-10-CM | POA: Diagnosis not present

## 2020-04-30 LAB — CBC WITH DIFFERENTIAL/PLATELET
Abs Immature Granulocytes: 0.07 10*3/uL (ref 0.00–0.07)
Basophils Absolute: 0 10*3/uL (ref 0.0–0.1)
Basophils Relative: 0 %
Eosinophils Absolute: 0 10*3/uL (ref 0.0–0.5)
Eosinophils Relative: 0 %
HCT: 30.4 % — ABNORMAL LOW (ref 36.0–46.0)
Hemoglobin: 10.3 g/dL — ABNORMAL LOW (ref 12.0–15.0)
Immature Granulocytes: 1 %
Lymphocytes Relative: 7 %
Lymphs Abs: 0.8 10*3/uL (ref 0.7–4.0)
MCH: 30.5 pg (ref 26.0–34.0)
MCHC: 33.9 g/dL (ref 30.0–36.0)
MCV: 89.9 fL (ref 80.0–100.0)
Monocytes Absolute: 0.5 10*3/uL (ref 0.1–1.0)
Monocytes Relative: 5 %
Neutro Abs: 9.4 10*3/uL — ABNORMAL HIGH (ref 1.7–7.7)
Neutrophils Relative %: 87 %
Platelets: 300 10*3/uL (ref 150–400)
RBC: 3.38 MIL/uL — ABNORMAL LOW (ref 3.87–5.11)
RDW: 14.8 % (ref 11.5–15.5)
WBC: 10.9 10*3/uL — ABNORMAL HIGH (ref 4.0–10.5)
nRBC: 0 % (ref 0.0–0.2)

## 2020-04-30 LAB — COMPREHENSIVE METABOLIC PANEL
ALT: 14 U/L (ref 0–44)
AST: 16 U/L (ref 15–41)
Albumin: 3.3 g/dL — ABNORMAL LOW (ref 3.5–5.0)
Alkaline Phosphatase: 69 U/L (ref 38–126)
Anion gap: 7 (ref 5–15)
BUN: 28 mg/dL — ABNORMAL HIGH (ref 8–23)
CO2: 25 mmol/L (ref 22–32)
Calcium: 8.5 mg/dL — ABNORMAL LOW (ref 8.9–10.3)
Chloride: 101 mmol/L (ref 98–111)
Creatinine, Ser: 2.25 mg/dL — ABNORMAL HIGH (ref 0.44–1.00)
GFR calc Af Amer: 26 mL/min — ABNORMAL LOW (ref >60)
GFR calc non Af Amer: 22 mL/min — ABNORMAL LOW (ref >60)
Glucose, Bld: 255 mg/dL — ABNORMAL HIGH (ref 70–99)
Potassium: 4.3 mmol/L (ref 3.5–5.1)
Sodium: 133 mmol/L — ABNORMAL LOW (ref 135–145)
Total Bilirubin: 0.6 mg/dL (ref 0.3–1.2)
Total Protein: 7 g/dL (ref 6.5–8.1)

## 2020-04-30 MED ORDER — HEPARIN SOD (PORK) LOCK FLUSH 100 UNIT/ML IV SOLN
500.0000 [IU] | Freq: Once | INTRAVENOUS | Status: AC
  Administered 2020-04-30: 500 [IU] via INTRAVENOUS
  Filled 2020-04-30: qty 5

## 2020-04-30 MED ORDER — HEPARIN SOD (PORK) LOCK FLUSH 100 UNIT/ML IV SOLN
500.0000 [IU] | Freq: Once | INTRAVENOUS | Status: DC | PRN
  Filled 2020-04-30: qty 5

## 2020-04-30 MED ORDER — PROCHLORPERAZINE MALEATE 10 MG PO TABS
10.0000 mg | ORAL_TABLET | Freq: Once | ORAL | Status: AC
  Administered 2020-04-30: 10 mg via ORAL
  Filled 2020-04-30: qty 1

## 2020-04-30 MED ORDER — SODIUM CHLORIDE 0.9 % IV SOLN
1600.0000 mg | Freq: Once | INTRAVENOUS | Status: AC
  Administered 2020-04-30: 1600 mg via INTRAVENOUS
  Filled 2020-04-30: qty 26.3

## 2020-04-30 MED ORDER — SODIUM CHLORIDE 0.9 % IV SOLN
Freq: Once | INTRAVENOUS | Status: AC
  Filled 2020-04-30: qty 250

## 2020-04-30 MED ORDER — SODIUM CHLORIDE 0.9% FLUSH
10.0000 mL | Freq: Once | INTRAVENOUS | Status: AC
  Administered 2020-04-30: 10 mL via INTRAVENOUS
  Filled 2020-04-30: qty 10

## 2020-04-30 MED ORDER — HEPARIN SOD (PORK) LOCK FLUSH 100 UNIT/ML IV SOLN
INTRAVENOUS | Status: AC
  Filled 2020-04-30: qty 5

## 2020-04-30 NOTE — Progress Notes (Signed)
[place] OFFICE PROGRESS NOTE  Patient Care Team: [name], [name], MD as Medical Oncologist (Medical Oncology) Corey Skains, MD as Consulting Physician (Cardiology)  Cancer Staging No matching staging information was found for the patient.   Oncology History Overview Note  # OCT 2015-STAGE IV LEFT BREAST T2N1 [T=4cm; [name]; [name]; [name]; EBUS- Positive Paratrac/subcarinal LN s/p ? Taxotere [in [place]; [name]; [name];[name]; [name]; suv 3]; [name]; [name]; [name];  [name]; Ant R iliac bone; [name]; [name]; START-Taxol weekly. March 2020- Taxol every 2 weeks [PN]; SEP 2020- PET progression  # SEP 04/29/2019- ERIBULIN s/p RT - Right hip- [s/p RT- NOV 2020]; AUG 2021-PET scan progressive disease ; rising tumor markers.  Discontinued Eribulin- AUG 2021 [left foot osteomyelitis s/p toe amputation; Dr. Antonietta Barcelona. [place]];  # SEP [id number]- GEMCIATBINE [871m/m2]  # Poorly controlled Blood sugars- improved.   # Pancreatitis Hx/PEI- on creon in past / CKD IV [creat ~ 3-4; [name]; [name]; mild left sided weakness]  # Jan 2020-  Lobular lesion on tongue- s/p excision pyogenic granuloma [Dr.McQueen]   # GENETIC TESTING/COUNSELLING: HETEROZYGOUS Cystic Fibrosis Gene [explains hx of recurrent pancreatitis]  # MOLECULAR TESTING: NA   # PALLIATIVE CARE:  1/22-Discussed/Declined ------------------------------------------------   DIAGNOSIS: [ 22542]BREAST CA; [name]; [name] ;GOALS: Palliative  CURRENT/MOST RECENT THERAPY: GEMCITABINE[C].     Carcinoma of upper-inner quadrant of left breast in female, estrogen receptor positive ([place]  04/29/2019 - 02/24/2020 Chemotherapy   The patient had eriBULin mesylate (HAL[place]) 2 mg in sodium chloride 0.9 % 100 mL chemo infusion, 2 mg, Intravenous,  Once, 12 of 14 cycles Dose modification: 1 mg/m2 (original dose 1 mg/m2, Cycle 1, Reason: Provider Judgment) Administration: 2 mg (06/03/2019), 2 mg (04/29/2019), 2 mg (06/10/2019), 2 mg (07/04/2019), 2 mg (07/11/2019), 2 mg (07/25/2019), 2 mg (08/03/2019), 2 mg (08/19/2019), 2 mg (08/26/2019), 2 mg (09/09/2019), 2 mg (09/16/2019), 2 mg (10/07/2019), 2 mg (10/14/2019), 2 mg (10/28/2019), 2 mg (11/04/2019), 2 mg (11/28/2019), 2 mg (12/09/2019), 2 mg (01/13/2020), 2 mg (01/20/2020), 2 mg (02/02/2020), 2 mg (02/13/2020), 2 mg (02/24/2020)  for chemotherapy treatment.    04/30/2020 -  Chemotherapy   The patient had gemcitabine (GEMZAR) 1,596 mg in sodium chloride 0.9 % 250 mL chemo infusion, 800 mg/m2 = 1,596 mg, Intravenous,  Once, 1 of 5 cycles  for chemotherapy treatment.     INTERVAL HISTORY:  [name] 64y.o.  female pleasant patient above history of metastatic ER PR positive HER-2 negative breast cancer; CKD stage IV-most recently progressed on eribulin  is here for follow-up.    In the interim patient was evaluated by Dr. FOla Spurrregarding her left foot osteomyelitis-s/p IV vancomycin currently on ciprofloxacin doxycycline.  She is also been evaluated by Dr. FVickki Muffpodiatry.  Awaiting to have catheter taken out tomorrow.  She has been discharged from the rehab to home 4 days ago.  She is able to transfer herself.  However she  still not ambulating of her own.  Denies any worsening pain.  Nuys any nausea vomiting with any headaches.  Chronic swelling in  the legs not any worse.  No fevers no chills.  Review of Systems  Constitutional: Positive for malaise/fatigue. Negative for chills, diaphoresis, [name], hemoptysis, sputum production, [name], palpitations and orthopnea.  Gastrointestinal: Negative for abdominal pain, blood in stool, constipation, diarrhea, heartburn, melena, nausea and vomiting.  Genitourinary: Negative for dysuria, frequency and urgency.  Musculoskeletal: Positive for back pain and joint pain.  Skin: Negative.  Negative for itching and rash.  Neurological: Positive for tingling. Negative for dizziness, focal weakness, weakness and headaches.  Endo/Heme/Allergies: Does not bruise/bleed easily.  Psychiatric/Behavioral: Negative for depression. The patient is not nervous/anxious and does not have insomnia.     PAST MEDICAL HISTORY :  Past Medical History:  Diagnosis Date   Anemia    Anxiety    Asthma    Cancer ([place]) 03/10/2018   Per [facility] PET order. Carcinoma of upper-inner quadrant of left breast in female, estrogen receptor positive .   Cancer ([place])    LUNG   CHF (congestive heart failure) ([place]) 1997   CKD (chronic kidney disease)    Depression    Diabetes mellitus, type 2 ([place])    Family history of breast cancer    Family history of colon cancer    Family history of ovarian cancer    Family history of pancreatic cancer    Family history of prostate cancer    Family history of stomach cancer    GERD (gastroesophageal reflux disease)    history of an ulcer   Hair loss    History of left breast cancer 05/29/14   History of partial hysterectomy 12/31/2016   Per patient.  Has not had a period in years.  Had a partial hysterectomy years ago.   Hypertension    Mitral valve regurgitation     Neuromuscular disorder (HCC)    neuropathies in hand   Obesity    Pancreatitis 1997   Stroke [facility]) 2010   with mild left arm weakness    PAST SURGICAL HISTORY :   Past Surgical History:  Procedure Laterality Date   AMPUTATION Left 03/30/2020   Procedure: AMPUTATION 5th RAY;  Surgeon: Samara Deist, DPM;  Location: [facility] ORS;  Service: Podiatry;  Laterality: Left;   APPLICATION OF WOUND VAC Left 03/30/2020   Procedure: APPLICATION OF WOUND VAC;  Surgeon: Samara Deist, DPM;  Location: [facility] OR[facility];  Service: Podiatry;  Laterality: Left;   CATARACT EXTRACTION W/PHACO Right 02/24/2019   Procedure: CATARACT EXTRACTION PHACO AND INTRAOCULAR LENS PLACEMENT ([place]) RIGHT DIABETES;  Surgeon: Marchia Meiers, MD;  Location: [facility] OR[facility];  Service: Ophthalmology;  Laterality: Right;  [place] 01:13.0 CDE 7.96 Fluid Pack Lot # [id number]   CATARACT EXTRACTION W/PHACO Left 03/24/2019   Procedure: CATARACT EXTRACTION PHACO AND INTRAOCULAR LENS PLACEMENT ([facility]) - left diabetic;  Surgeon: Marchia Meiers, MD;  Location: [facility];  Service: Ophthalmology;  Laterality: Left;  [place]  01:36 CDE 13.93 Fluid pack lot # [id number]   CENTRAL LINE INSERTION-TUNNELED N/A 04/04/2020   Procedure: CENTRAL LINE INSERTION-TUNNELED;  Surgeon: Katha Cabal, MD;  Location: [place] [facility];  Service: Cardiovascular;  Laterality: N/A;   CESAREAN SECTION     CHOLECYSTECTOMY  EXCISION OF TONGUE LESION N/A 08/17/2018   Procedure: EXCISION OF TONGUE LESION WITH FROZEN SECTIONS;  Surgeon: Beverly Gust, MD;  Location: [facility];  Service: ENT;  Laterality: N/A;   EYE SURGERY Right    cataract extraction   IRRIGATION AND DEBRIDEMENT FOOT Left 03/30/2020   Procedure: IRRIGATION AND DEBRIDEMENT FOOT;  Surgeon: Samara Deist, DPM;  Location: [facility];  Service: Podiatry;  Laterality: Left;   PARTIAL HYSTERECTOMY  12/31/2016   Per patient, she has not had a period in years since she had a partial hysterectomy.   PORTA CATH  INSERTION     TUBAL LIGATION      FAMILY HISTORY :   Family History  Problem Relation Age of Onset   Ovarian cancer Mother 81   Diabetes Mother    Hypertension Mother    COPD Father    Hypertension Father    Colon cancer Father 70   Diabetes Sister    Breast cancer Sister 37       bilateral   Diabetes Brother    Leukemia Maternal Aunt    Pancreatic cancer Paternal Aunt 93   Pancreatic cancer Paternal Uncle    Colon cancer Paternal Uncle    Stomach cancer Maternal Grandfather 15   Throat cancer Paternal Grandmother    Breast cancer Maternal Aunt 80   Colon cancer Maternal Aunt    Bone cancer Maternal Aunt    Breast cancer Paternal Aunt        dx >50   Prostate cancer Paternal Uncle    Pancreatic cancer Paternal Uncle    Throat cancer Paternal Uncle    Lung cancer Paternal Uncle    Stomach cancer Paternal Uncle    Brain cancer Paternal Aunt    Cancer Cousin        liver, kidney   Prostate cancer Cousin        meastatic   Lung cancer Other     SOCIAL HISTORY:   Social History   Tobacco Use   Smoking status: Former Smoker    Packs/day: 0.50    Years: 1.00    Pack years: 0.50    Types: Cigarettes   Smokeless tobacco: Never Used  Scientific laboratory technician Use: Never used  Substance Use Topics   Alcohol use: No    Alcohol/week: 0.0 standard drinks   Drug use: No    ALLERGIES:  is allergic to fish-derived products, chlorhexidine, and sulfamethoxazole-trimethoprim.  MEDICATIONS:  Current Outpatient Medications  Medication Sig Dispense Refill   acetaminophen-codeine (TYLENOL #4) 300-60 MG tablet Take 1 tablet by mouth in the morning and at bedtime.      albuterol (PROAIR HFA) 108 (90 BASE) MCG/ACT inhaler Inhale 2 puffs into the lungs every 6 (six) hours as needed for wheezing or shortness of breath.      ALPRAZolam (XANAX) 0.5 MG tablet Take 0.5 mg by mouth 2 (two) times daily as needed for anxiety or sleep.      amLODipine  (NORVASC) 10 MG tablet Take 10 mg by mouth daily.      aspirin EC 81 MG tablet Take 81 mg by mouth daily.      atenolol (TENORMIN) 100 MG tablet Take 100 mg by mouth 2 (two) times daily.      bumetanide (BUMEX) 0.5 MG tablet Take 0.5 mg by mouth 2 (two) times daily.      calcitRIOL (ROCALTROL) 0.25 MCG capsule Take 0.25 mcg by mouth daily.      Cinnamon 500  MG capsule Take 500 mg by mouth 2 (two) times daily.      ciprofloxacin (CIPRO) 500 MG tablet Take 500 mg by mouth 2 (two) times daily.     cloNIDine (CATAPRES) 0.2 MG tablet Take 0.2 mg by mouth 2 (two) times daily.      docusate sodium (COLACE) 100 MG capsule Take 1 capsule (100 mg total) by mouth 2 (two) times daily. (Patient taking differently: Take 100 mg by mouth daily. ) 10 capsule 0   doxycycline (VIBRAMYCIN) 100 MG capsule Take 100 mg by mouth 2 (two) times daily.     enalapril (VASOTEC) 10 MG tablet Take 20 mg by mouth 2 (two) times a day.      famotidine (PEPCID) 20 MG tablet Take 20 mg by mouth 2 (two) times daily.      ferrous sulfate 325 (65 FE) MG tablet Take 325 mg by mouth daily with breakfast.     FLUoxetine (PROZAC) 20 MG capsule Take 20 mg by mouth 2 (two) times daily.      Fluticasone Propionate, Inhal, (FLOVENT DISKUS) 100 MCG/BLIST AEPB Inhale 2 puffs into the lungs in the morning and at bedtime.     folic acid (FOLVITE) 1 MG tablet Take 1 tablet (1 mg total) by mouth daily.     gabapentin (NEURONTIN) 100 MG capsule TAKE 1 CAPSULE(100 MG) BY MOUTH AT BEDTIME (Patient taking differently: Take 100 mg by mouth at bedtime. ) 30 capsule 6   glyBURIDE (DIABETA) 5 MG tablet Take 10 mg by mouth 2 (two) times daily with a meal.      LEVEMIR FLEXTOUCH 100 UNIT/ML FlexPen Inject 25 Units into the skin daily. 15 mL 11   NOVOLOG FLEXPEN 100 UNIT/ML FlexPen Inject 5 Units into the skin 3 (three) times daily with meals. (Patient taking differently: Inject 7 Units into the skin 2 (two) times daily with a meal. ) 15 mL  11   senna (SENOKOT) 8.6 MG TABS tablet Take 1 tablet (8.6 mg total) by mouth 2 (two) times daily as needed for mild constipation or moderate constipation. (Patient not taking: Reported on 04/27/2020) 120 tablet 0   simvastatin (ZOCOR) 20 MG tablet Take 20 mg by mouth every evening.  (Patient not taking: Reported on 04/27/2020)     No current facility-administered medications for this visit.   Facility-Administered Medications Ordered in Other Visits  Medication Dose Route Frequency Provider Last Rate Last Admin   gemcitabine (GEMZAR) 1,600 mg in sodium chloride 0.9 % 250 mL chemo infusion  1,600 mg Intravenous Once Cammie Sickle, MD 584 mL/hr at 04/30/20 1105 1,600 mg at 04/30/20 1105   heparin lock flush 100 unit/mL  500 Units Intravenous Once Charlaine Dalton R, MD       heparin lock flush 100 unit/mL  500 Units Intracatheter Once PRN Cammie Sickle, MD       sodium chloride flush (NS) 0.9 % injection 10 mL  10 mL Intravenous PRN Cammie Sickle, MD   10 mL at 01/30/16 1054    PHYSICAL EXAMINATION: ECOG PERFORMANCE STATUS: 1 - Symptomatic but completely ambulatory  BP (!) 147/88 (BP Location: Right Arm, Patient Position: Sitting, Cuff Size: Large)    Pulse 72    Temp (!) 97 F (36.1 C) (Tympanic)    Resp 16    Ht _0  (1.575 m)    Wt 195 lb (88.5 kg)    SpO2 95%    BMI 35.67 kg/m   [employer]  04/30/20 0934  Weight: 195 lb (88.5 kg)    Physical Exam Constitutional:      Comments: She is alone.  HENT:     Head: Normocephalic and atraumatic.     Mouth/Throat:     Pharynx: No oropharyngeal exudate.  Eyes:     Pupils: Pupils are equal, round, and reactive to light.  Cardiovascular:     Rate and Rhythm: Normal rate and regular rhythm.  Pulmonary:     Effort: No respiratory distress.     Breath sounds: Normal breath sounds. No wheezing.  Abdominal:     General: Bowel sounds are normal. There is no distension.     Palpations: Abdomen is soft.  There is no mass.     Tenderness: There is no abdominal tenderness. There is no guarding or rebound.  Musculoskeletal:        General: No tenderness. Normal range of motion.     Cervical back: Normal range of motion and neck supple.  Skin:    General: Skin is warm.     Comments: Left sole-ulceration noted/see picture below.  Neurological:     Mental Status: She is alert and oriented to person, place, and time.  Psychiatric:        Mood and Affect: Affect normal.     LABORATORY DATA:  I have reviewed the data as listed    Component Value Date/Time   NA 133 (L) 04/30/2020 0912   NA 130 (L) 06/06/2014 1102   K 4.3 04/30/2020 0912   K 3.9 06/06/2014 1102   CL 101 04/30/2020 0912   CL 95 (L) 06/06/2014 1102   CO2 25 04/30/2020 0912   CO2 28 06/06/2014 1102   GLUCOSE 255 (H) 04/30/2020 0912   GLUCOSE 349 (H) 06/06/2014 1102   BUN 28 (H) 04/30/2020 0912   BUN 17 06/06/2014 1102   CREATININE 2.25 (H) 04/30/2020 0912   CREATININE 1.63 (H) 06/06/2014 1102   CALCIUM 8.5 (L) 04/30/2020 0912   CALCIUM 9.2 06/06/2014 1102   PROT 7.0 04/30/2020 0912   PROT 8.2 06/06/2014 1102   ALBUMIN 3.3 (L) 04/30/2020 0912   ALBUMIN 3.3 (L) 06/06/2014 1102   AST 16 04/30/2020 0912   AST 7 (L) 06/06/2014 1102   ALT 14 04/30/2020 0912   ALT 12 (L) 06/06/2014 1102   ALKPHOS 69 04/30/2020 0912   ALKPHOS 74 06/06/2014 1102   BILITOT 0.6 04/30/2020 0912   BILITOT 0.4 06/06/2014 1102   GFRNONAA 22 (L) 04/30/2020 0912   GFRNONAA 35 (L) 06/06/2014 1102   GFRAA 26 (L) 04/30/2020 0912   GFRAA 42 (L) 06/06/2014 1102    No results found for: SPEP, UPEP  Lab Results  Component Value Date   WBC 10.9 (H) 04/30/2020   NEUTROABS 9.4 (H) 04/30/2020   HGB 10.3 (L) 04/30/2020   HCT 30.4 (L) 04/30/2020   MCV 89.9 04/30/2020   PLT 300 04/30/2020      Chemistry      Component Value Date/Time   NA 133 (L) 04/30/2020 0912   NA 130 (L) 06/06/2014 1102   K 4.3 04/30/2020 0912   K 3.9 06/06/2014 1102    CL 101 04/30/2020 0912   CL 95 (L) 06/06/2014 1102   CO2 25 04/30/2020 0912   CO2 28 06/06/2014 1102   BUN 28 (H) 04/30/2020 0912   BUN 17 06/06/2014 1102   CREATININE 2.25 (H) 04/30/2020 0912   CREATININE 1.63 (H) 06/06/2014 1102      Component Value Date/Time  CALCIUM 8.5 (L) 04/30/2020 0912   CALCIUM 9.2 06/06/2014 1102   ALKPHOS 69 04/30/2020 0912   ALKPHOS 74 06/06/2014 1102   AST 16 04/30/2020 0912   AST 7 (L) 06/06/2014 1102   ALT 14 04/30/2020 0912   ALT 12 (L) 06/06/2014 1102   BILITOT 0.6 04/30/2020 0912   BILITOT 0.4 06/06/2014 1102         RADIOGRAPHIC STUDIES: I have personally reviewed the radiological images as listed and agreed with the findings in the report. No results found.   ASSESSMENT & PLAN:  Carcinoma of upper-inner quadrant of left breast in female, estrogen receptor positive ([place]) Left breast cancer- stage IV- ER/PR +, HER-2/neu.  PET AUG 2021-shows progressive lung lesion/pleural-based; bone lesions; consistent with rising tumor marker. Proceed with Gemciabine chemo today.  # proceed Gemcitabine cycle #1; day-1. Labs today reviewed;  acceptable for treatment today.  Patient understands goal of treatment is palliative not curative.  Patient understands increased risk of infection while on chemotherapy.  # Left foot infection/ osteomyelitis- s/p amputation of left 5th metatarsal. S/p IV Vanc; currently on oral cipro and doxycycline s/p evaluation Dr.Fitzgerald. Discussed with Dr.Fowler/Dr. Ola Spurr.   # Bone mets-sclerotic; Right acetabular uptake-s/p radiation. Hypocalcemia-ca-8.3/low vitD- on vit D daily. 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   # Chronic kidney disease - stage III-IV-GFR-28; STABLE [[name]  # Anemia- hemoglobin today~10.2; /CKD-STABLE  On PO iron.  # Second covid- this Friday; Flu shot- in next couple of weeks.   # DISPOSITION:  # Gem chemo today # 1 week-MD; labs-cbc/bmp;Gemcitabine  #   Follow up in 3 week; MD; labs- cbc/cmp;ca-27-27;cea; ca-15-3-;Gemcitabine- Dr.B   Orders Placed This Encounter  Procedures   CBC with Differential    Standing Status:   Future    Standing Expiration Date:   0/98/1191   Basic metabolic panel    Standing Status:   Future    Standing Expiration Date:   04/30/2021   Comprehensive metabolic panel    Standing Status:   Future    Standing Expiration Date:   04/30/2021   CBC with Differential    Standing Status:   Future    Standing Expiration Date:   04/30/2021   Cancer antigen 27.29    Standing Status:   Future    Standing Expiration Date:   04/30/2021   All questions were answered. The patient knows to call the clinic with any problems, questions or concerns.      Cammie Sickle, MD 04/30/2020 11:19 AM

## 2020-04-30 NOTE — Assessment & Plan Note (Addendum)
Left breast cancer- stage IV- ER/PR +, HER-2/neu.  PET AUG 2021-shows progressive lung lesion/pleural-based; bone lesions; consistent with rising tumor marker. Proceed with Gemciabine chemo today.  # proceed Gemcitabine cycle #1; day-1. Labs today reviewed;  acceptable for treatment today.  Patient understands goal of treatment is palliative not curative.  Patient understands increased risk of infection while on chemotherapy.  # Left foot infection/ osteomyelitis- s/p amputation of left 5th metatarsal. S/p IV Vanc; currently on oral cipro and doxycycline s/p evaluation Dr.Fitzgerald. Discussed with Dr.Fowler/Dr. Ola Spurr.   # Bone mets-sclerotic; Right acetabular uptake-s/p radiation. Hypocalcemia-ca-8.3/low vitD- on vit D daily. 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   # Chronic kidney disease - stage III-IV-GFR-28; STABLE [[name]  # Anemia- hemoglobin today~10.2; /CKD-STABLE  On PO iron.  # Second covid- this Friday; Flu shot- in next couple of weeks.   # DISPOSITION:  # Gem chemo today # 1 week-MD; labs-cbc/bmp;Gemcitabine  #  Follow up in 3 week; MD; labs- cbc/cmp;ca-27-27;cea; ca-15-3-;Gemcitabine- Dr.B

## 2020-04-30 NOTE — Progress Notes (Signed)
Pt tolerated infusion well. No s/s of distress or reaction noted. Pt stable at discharge.  

## 2020-05-01 ENCOUNTER — Ambulatory Visit
Admission: RE | Admit: 2020-05-01 | Discharge: 2020-05-01 | Disposition: A | Discharge status: Home / Self Care | Payer: Medicare HMO | Attending: Vascular Surgery | Admitting: Vascular Surgery

## 2020-05-01 ENCOUNTER — Other Ambulatory Visit (INDEPENDENT_AMBULATORY_CARE_PROVIDER_SITE_OTHER): Payer: Self-pay | Admitting: Nurse Practitioner

## 2020-05-01 ENCOUNTER — Encounter: Payer: Self-pay | Admitting: Vascular Surgery

## 2020-05-01 ENCOUNTER — Encounter
Admission: RE | Disposition: A | Discharge status: Home / Self Care | Payer: Self-pay | Source: Home / Self Care | Attending: Vascular Surgery

## 2020-05-01 ENCOUNTER — Other Ambulatory Visit: Payer: Self-pay

## 2020-05-01 DIAGNOSIS — E785 Hyperlipidemia, unspecified: Secondary | ICD-10-CM | POA: Insufficient documentation

## 2020-05-01 DIAGNOSIS — Z87891 Personal history of nicotine dependence: Secondary | ICD-10-CM | POA: Diagnosis not present

## 2020-05-01 DIAGNOSIS — Z8349 Family history of other endocrine, nutritional and metabolic diseases: Secondary | ICD-10-CM | POA: Insufficient documentation

## 2020-05-01 DIAGNOSIS — X58XXXA Exposure to other specified factors, initial encounter: Secondary | ICD-10-CM | POA: Insufficient documentation

## 2020-05-01 DIAGNOSIS — Z853 Personal history of malignant neoplasm of breast: Secondary | ICD-10-CM | POA: Insufficient documentation

## 2020-05-01 DIAGNOSIS — Z792 Long term (current) use of antibiotics: Secondary | ICD-10-CM | POA: Insufficient documentation

## 2020-05-01 DIAGNOSIS — S91302A Unspecified open wound, left foot, initial encounter: Secondary | ICD-10-CM | POA: Insufficient documentation

## 2020-05-01 DIAGNOSIS — Z452 Encounter for adjustment and management of vascular access device: Secondary | ICD-10-CM | POA: Diagnosis not present

## 2020-05-01 DIAGNOSIS — N183 Chronic kidney disease, stage 3 unspecified: Secondary | ICD-10-CM | POA: Insufficient documentation

## 2020-05-01 DIAGNOSIS — I130 Hypertensive heart and chronic kidney disease with heart failure and stage 1 through stage 4 chronic kidney disease, or unspecified chronic kidney disease: Secondary | ICD-10-CM | POA: Insufficient documentation

## 2020-05-01 DIAGNOSIS — Z8673 Personal history of transient ischemic attack (TIA), and cerebral infarction without residual deficits: Secondary | ICD-10-CM | POA: Diagnosis not present

## 2020-05-01 DIAGNOSIS — M86172 Other acute osteomyelitis, left ankle and foot: Secondary | ICD-10-CM | POA: Diagnosis not present

## 2020-05-01 DIAGNOSIS — I509 Heart failure, unspecified: Secondary | ICD-10-CM | POA: Insufficient documentation

## 2020-05-01 DIAGNOSIS — E1140 Type 2 diabetes mellitus with diabetic neuropathy, unspecified: Secondary | ICD-10-CM | POA: Insufficient documentation

## 2020-05-01 DIAGNOSIS — R509 Fever, unspecified: Secondary | ICD-10-CM | POA: Diagnosis not present

## 2020-05-01 DIAGNOSIS — E1122 Type 2 diabetes mellitus with diabetic chronic kidney disease: Secondary | ICD-10-CM | POA: Diagnosis not present

## 2020-05-01 DIAGNOSIS — Z881 Allergy status to other antibiotic agents status: Secondary | ICD-10-CM | POA: Diagnosis not present

## 2020-05-01 DIAGNOSIS — K219 Gastro-esophageal reflux disease without esophagitis: Secondary | ICD-10-CM | POA: Diagnosis not present

## 2020-05-01 HISTORY — PX: DIALYSIS/PERMA CATHETER REMOVAL: CATH118289

## 2020-05-01 SURGERY — DIALYSIS/PERMA CATHETER REMOVAL
Anesthesia: LOCAL

## 2020-05-01 MED ORDER — LIDOCAINE-EPINEPHRINE (PF) 1 %-1:200000 IJ SOLN
INTRAMUSCULAR | Status: DC | PRN
  Administered 2020-05-01: 10 mL via INTRADERMAL

## 2020-05-01 SURGICAL SUPPLY — 2 items
FORCEPS HALSTEAD CVD 5IN STRL (INSTRUMENTS) ×2 IMPLANT
TRAY LACERAT/PLASTIC (MISCELLANEOUS) ×2 IMPLANT

## 2020-05-01 NOTE — Consult Note (Signed)
[facility] Admission History & Physical  MRN : [id number]  [name] is a 64 y.o. (09-Jun-1956) female who presents with chief complaint of scheduled hickman removal.   History of Present Illness:  Presents for scheduled Hickman catheter removal.   Gloria A Mebaneis a 64 y.o.femalewith medical history significant ofmetastatic breast cancer, diabetes mellitus, CKD stage III, CHF, who underwent a hickman catheter placement for IV ABX for open draining left foot wound and associated fever. Patient has now finished her course of IV ABX. Patient denies pain or tenderness overlying the access.    No current facility-administered medications for this encounter.   Facility-Administered Medications Ordered in Other Encounters  Medication Dose Route Frequency Provider Last Rate Last Admin  . sodium chloride flush (NS) 0.9 % injection 10 mL  10 mL Intravenous PRN Cammie Sickle, MD   10 mL at 01/30/16 1054   Past Medical History:  Diagnosis Date  . Anemia   . Anxiety   . Asthma   . Cancer ([place]) 03/10/2018   Per NM PET order. Carcinoma of upper-inner quadrant of left breast in female, estrogen receptor positive .  Marland Kitchen Cancer (HCC)    LUNG  . CHF (congestive heart failure) ([place]) 1997  . CKD (chronic kidney disease)   . Depression   . Diabetes mellitus, type 2 ([place])   . Family history of breast cancer   . Family history of colon cancer   . Family history of ovarian cancer   . Family history of pancreatic cancer   . Family history of prostate cancer   . Family history of stomach cancer   . GERD (gastroesophageal reflux disease)    history of an ulcer  . Hair loss   . History of left breast cancer 05/29/14  . History of partial hysterectomy 12/31/2016   Per patient.  Has not had a period in years.  Had a partial hysterectomy years ago.  Marland Kitchen Hypertension   . Mitral valve regurgitation   . Neuromuscular disorder (HCC)    neuropathies in hand  . Obesity    . Pancreatitis 1997  . Stroke [facility]) 2010   with mild left arm weakness   Past Surgical History:  Procedure Laterality Date  . AMPUTATION Left 03/30/2020   Procedure: AMPUTATION 5th RAY;  Surgeon: Samara Deist, DPM;  Location: [facility] ORS;  Service: Podiatry;  Laterality: Left;  . APPLICATION OF WOUND VAC Left 03/30/2020   Procedure: APPLICATION OF WOUND VAC;  Surgeon: Samara Deist, DPM;  Location: [facility] ORS;  Service: Podiatry;  Laterality: Left;  . CATARACT EXTRACTION W/PHACO Right 02/24/2019   Procedure: CATARACT EXTRACTION PHACO AND INTRAOCULAR LENS PLACEMENT ([place]) RIGHT DIABETES;  Surgeon: Marchia Meiers, MD;  Location: [facility] ORS;  Service: Ophthalmology;  Laterality: Right;  [place] 01:13.0 CDE 7.96 Fluid Pack Lot # [id number]  . CATARACT EXTRACTION W/PHACO Left 03/24/2019   Procedure: CATARACT EXTRACTION PHACO AND INTRAOCULAR LENS PLACEMENT ([facility]) - left diabetic;  Surgeon: Marchia Meiers, MD;  Location: [facility] ORS;  Service: Ophthalmology;  Laterality: Left;  [place]  01:36 CDE 13.93 Fluid pack lot # [id number]  . CENTRAL LINE INSERTION-TUNNELED N/A 04/04/2020   Procedure: CENTRAL LINE INSERTION-TUNNELED;  Surgeon: Katha Cabal, MD;  Location: [facility];  Service: Cardiovascular;  Laterality: N/A;  . CESAREAN SECTION    . CHOLECYSTECTOMY    . EXCISION OF TONGUE LESION N/A 08/17/2018   Procedure: EXCISION OF TONGUE LESION WITH FROZEN SECTIONS;  Surgeon: Beverly Gust, MD;  Location:  [facility] ORS;  Service: ENT;  Laterality: N/A;  . EYE SURGERY Right    cataract extraction  . IRRIGATION AND DEBRIDEMENT FOOT Left 03/30/2020   Procedure: IRRIGATION AND DEBRIDEMENT FOOT;  Surgeon: Samara Deist, DPM;  Location: [facility] ORS;  Service: Podiatry;  Laterality: Left;  . PARTIAL HYSTERECTOMY  12/31/2016   Per patient, she has not had a period in years since she had a partial hysterectomy.  Marland Kitchen PORTA CATH INSERTION    . TUBAL LIGATION     Social History Social History   Tobacco Use  . Smoking  status: Former Smoker    Packs/day: 0.50    Years: 1.00    Pack years: 0.50    Types: Cigarettes  . Smokeless tobacco: Never Used  Vaping Use  . Vaping Use: Never used  Substance Use Topics  . Alcohol use: No    Alcohol/week: 0.0 standard drinks  . Drug use: No   Family History Family History  Problem Relation Age of Onset  . Ovarian cancer Mother 48  . Diabetes Mother   . Hypertension Mother   . COPD Father   . Hypertension Father   . Colon cancer Father 74  . Diabetes Sister   . Breast cancer Sister 24       bilateral  . Diabetes Brother   . Leukemia Maternal Aunt   . Pancreatic cancer Paternal Aunt 13  . Pancreatic cancer Paternal Uncle   . Colon cancer Paternal Uncle   . Stomach cancer Maternal Grandfather 72  . Throat cancer Paternal Grandmother   . Breast cancer Maternal Aunt 80  . Colon cancer Maternal Aunt   . Bone cancer Maternal Aunt   . Breast cancer Paternal Aunt        dx >50  . Prostate cancer Paternal Uncle   . Pancreatic cancer Paternal Uncle   . Throat cancer Paternal Uncle   . Lung cancer Paternal Uncle   . Stomach cancer Paternal Uncle   . Brain cancer Paternal Aunt   . Cancer Cousin        liver, kidney  . Prostate cancer Cousin        meastatic  . Lung cancer Other   No family history of bleeding or clotting disorders, autoimmune disease or porphyria.  Allergies  Allergen Reactions  . Fish-Derived Products Anaphylaxis  . Chlorhexidine   . Sulfamethoxazole-Trimethoprim Other (See Comments)    Caused kidney issues   REVIEW OF SYSTEMS (Negative unless checked)  Constitutional: [] Weight loss  [] Fever  [] Chills Cardiac: [] Chest pain   [] Chest pressure   [] Palpitations   [] Shortness of breath when laying flat   [] Shortness of breath at rest   [x] Shortness of breath with exertion. Vascular:  [] Pain in legs with walking   [] Pain in legs at rest   [] Pain in legs when laying flat   [] Claudication   [] Pain in feet when walking  [] Pain in feet at  rest  [] Pain in feet when laying flat   [] History of DVT   [] Phlebitis   [] Swelling in legs   [] Varicose veins   [] Non-healing ulcers Pulmonary:   [] Uses home oxygen   [] Productive cough   [] Hemoptysis   [] Wheeze  [] COPD   [] Asthma Neurologic:  [] Dizziness  [] Blackouts   [] Seizures   [] History of stroke   [] History of TIA  [] Aphasia   [] Temporary blindness   [] Dysphagia   [] Weakness or numbness in arms   [] Weakness or numbness in legs Musculoskeletal:  [] Arthritis   [] Joint swelling   []   Joint pain   [] Low back pain Hematologic:  [] Easy bruising  [] Easy bleeding   [] Hypercoagulable state   [] Anemic  [] Hepatitis Gastrointestinal:  [] [name]   [] Vomiting blood  [] Gastroesophageal reflux/heartburn   [] Difficulty swallowing. Genitourinary:  [x] Chronic kidney disease   [] Difficult urination  [] Frequent urination  [] Burning with urination   [] Blood in urine Skin:  [] Rashes   [x] Ulcers   [x] Wounds Psychological:  [] History of anxiety   []  History of major depression.  Physical Examination  Vitals:   05/01/20 1316  Temp: (P) 98.1 F (36.7 C)  TempSrc: (P) Oral   There is no height or weight on file to calculate BMI. Gen: WD/WN, NAD Head: [place]/AT, No temporalis wasting. Prominent temp pulse not noted. Ear/Nose/Throat: Hearing grossly intact, nares w/o erythema or drainage, oropharynx w/o Erythema/Exudate,  Eyes: Conjunctiva clear, sclera non-icteric Neck: Trachea midline.  No JVD.  Pulmonary:  Good air movement, respirations not labored, no use of accessory muscles.  Cardiac: RRR, normal S1, S2. Vascular:  Vessel Right Left  Radial Palpable Palpable  Ulnar Not Palpable Not Palpable  Brachial Palpable Palpable  Carotid Palpable, without bruit Palpable, without bruit  Gastrointestinal: soft, non-tender/non-distended. No guarding/reflex.  Musculoskeletal: M/S 5/5 throughout.  Extremities without ischemic changes.  No deformity or atrophy.  Neurologic: Sensation grossly intact in  extremities.  Symmetrical.  Speech is fluent. Motor exam as listed above. Psychiatric: Judgment intact, Mood & affect appropriate for pt's clinical situation. Dermatologic: No rashes or ulcers noted.  No cellulitis or open wounds. Lymph : No Cervical, Axillary, or Inguinal lymphadenopathy.  CBC Lab Results  Component Value Date   WBC 10.9 (H) 04/30/2020   HGB 10.3 (L) 04/30/2020   HCT 30.4 (L) 04/30/2020   MCV 89.9 04/30/2020   PLT 300 04/30/2020   BMET    Component Value Date/Time   NA 133 (L) 04/30/2020 0912   NA 130 (L) 06/06/2014 1102   K 4.3 04/30/2020 0912   K 3.9 06/06/2014 1102   CL 101 04/30/2020 0912   CL 95 (L) 06/06/2014 1102   CO2 25 04/30/2020 0912   CO2 28 06/06/2014 1102   GLUCOSE 255 (H) 04/30/2020 0912   GLUCOSE 349 (H) 06/06/2014 1102   BUN 28 (H) 04/30/2020 0912   BUN 17 06/06/2014 1102   CREATININE 2.25 (H) 04/30/2020 0912   CREATININE 1.63 (H) 06/06/2014 1102   CALCIUM 8.5 (L) 04/30/2020 0912   CALCIUM 9.2 06/06/2014 1102   GFRNONAA 22 (L) 04/30/2020 0912   GFRNONAA 35 (L) 06/06/2014 1102   GFRAA 26 (L) 04/30/2020 0912   GFRAA 42 (L) 06/06/2014 1102   Estimated Creatinine Clearance: 26.5 mL/min (A) (by C-G formula based on SCr of 2.25 mg/dL (H)).  COAG Lab Results  Component Value Date   INR 1.3 (H) 03/30/2020   INR 1.0 03/01/2020   INR 1.0 06/06/2014   Radiology PERIPHERAL VASCULAR CATHETERIZATION  Result Date: 04/04/2020 See Op Note  DG Foot Complete Left  Result Date: 04/03/2020 CLINICAL DATA:  Fifth ray amputation EXAM: LEFT FOOT - COMPLETE 3+ VIEW COMPARISON:  None. FINDINGS: Interval postsurgical changes from transmetatarsal amputation of the fifth ray at the level of the proximal diaphysis. Resection margin is smooth. Expected postoperative changes within the distal soft tissues at the amputation site. No acute fracture. No malalignment. No new areas of cortical irregularity. IMPRESSION: Interval postsurgical changes from  transmetatarsal amputation of the fifth ray at the level of the proximal diaphysis. Electronically Signed   By: Davina Poke D.O.  On: 04/03/2020 16:23   Assessment/Plan 1.  Need for IV ABX:  A hickman catheter was placed for IV ABX for open draining left foot wound and associated fever. Patient has now finished her course of IV ABX.  Patient has now finished her course of IV antibiotics and is no longer need of her catheter.  Will remove this today.  Procedure, risks and benefits explained to the patient.  All questions were answered.  The patient was to proceed.  2.  Hypertension:  Patient will continue medical management; nephrology is following no changes in oral medications.  3.  Hyperlipidemia: Encouraged good control as its slows the progression of atherosclerotic disease  Discussed with Dr. Francene Castle, PA-C  05/01/2020 1:59 PM

## 2020-05-01 NOTE — Op Note (Signed)
Operative Note  Preoperative diagnosis:    1. Need for long term ABX  Postoperative diagnosis:   1. Need for long term ABX  Procedure:  Removal of [place]  Physician Assistant: Hezzie Bump PA-C  Supervising Surgeon:  Delana Meyer, MD  Anesthesia:  Local  EBL:  Minimal  Indication for the Procedure:  Patient has now finished her course of IV ABX This can be removed.  Risks and benefits are discussed and informed consent is obtained.  Description of the Procedure:  The patient's left neck, chest and existing catheter were sterilely prepped and draped. The area around the catheter was anesthetized copiously with 1% lidocaine. The catheter was dissected out with curved hemostats until the cuff was freed from the surrounding fibrous sheath. The fiber sheath was transected, and the catheter was then removed in its entirety using gentle traction. Pressure was held and sterile dressings were placed. The patient tolerated the procedure well and was taken to the recovery room in stable condition.  Baron Parmelee A Medford Staheli  05/01/2020, 2:01 PM  This note was created with Dragon Medical transcription system. Any errors in dictation are purely unintentional.

## 2020-05-01 NOTE — Discharge Instructions (Signed)
Hickman Catheter Removal, Care After Refer to this sheet in the next few weeks. These instructions provide you with information about caring for yourself after your procedure. Your health care provider may also give you more specific instructions. Your treatment has been planned according to current medical practices, but problems sometimes occur. Call your health care provider if you have any problems or questions after your procedure. What can I expect after the procedure? After the procedure, it is common to have:  Some mild redness, swelling, and pain around your catheter site.   Follow these instructions at home: Incision care   Check your removal site  every day for signs of infection. Check for:  More redness, swelling, or pain.  More fluid or blood.  Warmth.  Pus or a bad smell.  Remove your dressing in 48hrs leave open to air  Activity   Return to your normal activities as told by your health care provider. Ask your health care provider what activities are safe for you.  Do not lift anything that is heavier than 10 lb (4.5 kg) for 3 days   You may shower tomorrow  Contact a health care provider if:  You have more fluid or blood coming from your removal site  You have more redness, swelling, or pain at your incisions or around the area where your catheter was removed  Your removal site feel warm to the touch.  You feel unusually weak.  You feel nauseous..  Get help right away if  You have swelling in your arm, shoulder, neck, or face.  You develop chest pain.  You have difficulty breathing.  You feel dizzy or light-headed.  You have pus or a bad smell coming from your removal site  You have a fever.  You develop bleeding from your removal site, and your bleeding does not stop. This information is not intended to replace advice given to you by your health care provider. Make sure you discuss any questions you have with your health care  provider. Document Released: 07/07/2012 Document Revised: 03/23/2016 Document Reviewed: 04/16/2015 Elsevier Interactive Patient Education  2017 [employer].

## 2020-05-02 ENCOUNTER — Encounter: Payer: Self-pay | Admitting: Vascular Surgery

## 2020-05-02 DIAGNOSIS — I509 Heart failure, unspecified: Secondary | ICD-10-CM | POA: Diagnosis not present

## 2020-05-02 DIAGNOSIS — Z4781 Encounter for orthopedic aftercare following surgical amputation: Secondary | ICD-10-CM | POA: Diagnosis not present

## 2020-05-02 DIAGNOSIS — I051 Rheumatic mitral insufficiency: Secondary | ICD-10-CM | POA: Diagnosis not present

## 2020-05-02 DIAGNOSIS — E1169 Type 2 diabetes mellitus with other specified complication: Secondary | ICD-10-CM | POA: Diagnosis not present

## 2020-05-02 DIAGNOSIS — M869 Osteomyelitis, unspecified: Secondary | ICD-10-CM | POA: Diagnosis not present

## 2020-05-02 DIAGNOSIS — C50919 Malignant neoplasm of unspecified site of unspecified female breast: Secondary | ICD-10-CM | POA: Diagnosis not present

## 2020-05-02 DIAGNOSIS — E1122 Type 2 diabetes mellitus with diabetic chronic kidney disease: Secondary | ICD-10-CM | POA: Diagnosis not present

## 2020-05-02 DIAGNOSIS — I130 Hypertensive heart and chronic kidney disease with heart failure and stage 1 through stage 4 chronic kidney disease, or unspecified chronic kidney disease: Secondary | ICD-10-CM | POA: Diagnosis not present

## 2020-05-02 DIAGNOSIS — N184 Chronic kidney disease, stage 4 (severe): Secondary | ICD-10-CM | POA: Diagnosis not present

## 2020-05-02 LAB — CANCER ANTIGEN 27.29: CA 27.29: 191 U/mL — ABNORMAL HIGH (ref 0.0–38.6)

## 2020-05-03 DIAGNOSIS — I509 Heart failure, unspecified: Secondary | ICD-10-CM | POA: Diagnosis not present

## 2020-05-03 DIAGNOSIS — N184 Chronic kidney disease, stage 4 (severe): Secondary | ICD-10-CM | POA: Diagnosis not present

## 2020-05-03 DIAGNOSIS — M869 Osteomyelitis, unspecified: Secondary | ICD-10-CM | POA: Diagnosis not present

## 2020-05-03 DIAGNOSIS — Z4781 Encounter for orthopedic aftercare following surgical amputation: Secondary | ICD-10-CM | POA: Diagnosis not present

## 2020-05-03 DIAGNOSIS — E1169 Type 2 diabetes mellitus with other specified complication: Secondary | ICD-10-CM | POA: Diagnosis not present

## 2020-05-03 DIAGNOSIS — E1122 Type 2 diabetes mellitus with diabetic chronic kidney disease: Secondary | ICD-10-CM | POA: Diagnosis not present

## 2020-05-03 DIAGNOSIS — I051 Rheumatic mitral insufficiency: Secondary | ICD-10-CM | POA: Diagnosis not present

## 2020-05-03 DIAGNOSIS — C50919 Malignant neoplasm of unspecified site of unspecified female breast: Secondary | ICD-10-CM | POA: Diagnosis not present

## 2020-05-03 DIAGNOSIS — I130 Hypertensive heart and chronic kidney disease with heart failure and stage 1 through stage 4 chronic kidney disease, or unspecified chronic kidney disease: Secondary | ICD-10-CM | POA: Diagnosis not present

## 2020-05-04 ENCOUNTER — Other Ambulatory Visit: Payer: Self-pay

## 2020-05-04 ENCOUNTER — Encounter: Payer: Medicare HMO | Attending: Physician Assistant | Admitting: Physician Assistant

## 2020-05-04 DIAGNOSIS — T8189XA Other complications of procedures, not elsewhere classified, initial encounter: Secondary | ICD-10-CM | POA: Diagnosis not present

## 2020-05-04 DIAGNOSIS — L97523 Non-pressure chronic ulcer of other part of left foot with necrosis of muscle: Secondary | ICD-10-CM | POA: Diagnosis present

## 2020-05-04 DIAGNOSIS — E11621 Type 2 diabetes mellitus with foot ulcer: Secondary | ICD-10-CM | POA: Insufficient documentation

## 2020-05-04 NOTE — Progress Notes (Signed)
[name], [name] ([id number]) Visit Report for 05/04/2020 Allergy List Details Patient Name: [name], [name] Date of Service: 05/04/2020 10:45 AM Medical Record Number: [id number] Patient Account Number: [id number] Date of Birth/Sex: 11/09/1955 (64 y.o. F) Treating RN: Cornell Barman Primary Care Mostyn Varnell: [name] Other Clinician: Referring Laken Lobato: [name] Treating Jef Futch/Extender: Melburn Hake, HOYT Weeks in Treatment: 0 Allergies Active Allergies Bactrim Allergy Notes Electronic Signature(s) Signed: 05/04/2020 8:16:30 AM By: Gretta Cool, BSN, RN, CWS, Kim RN, BSN Entered By: Gretta Cool, BSN, RN, CWS, Kim on 05/04/2020 16:61:96

## 2020-05-04 NOTE — Progress Notes (Signed)
[name], [name] ([id number]) Visit Report for 05/04/2020 Education Screening Details Patient Name: [name], [name] Date of Service: 05/04/2020 10:45 AM Medical Record Number: [id number] Patient Account Number: [id number] Date of Birth/Sex: 1956-02-02 (64 y.o. F) Treating RN: Cornell Barman Primary Care Felicidad Sugarman: [name] Other Clinician: Referring Rashi Giuliani: [name] Treating Nate Perri/Extender: Melburn Hake, HOYT Weeks in Treatment: 0 Learning Preferences/Education Level/Primary Language Learning Preference: Explanation Highest Education Level: College or Above Preferred Language: English Cognitive Barrier Language Barrier: No Translator Needed: No Memory Deficit: No Emotional Barrier: No Cultural/Religious Beliefs Affecting Medical Care: No Physical Barrier Impaired Vision: No Impaired Hearing: No Decreased Hand dexterity: No Knowledge/Comprehension Knowledge Level: High Comprehension Level: High Ability to understand written instructions: High Motivation Anxiety Level: Calm Cooperation: Cooperative Education Importance: Acknowledges Need Interest in Health Problems: Asks Questions Perception: Coherent Willingness to Engage in Self-Management High Activities: Readiness to Engage in Self-Management High Activities: Engineer, maintenance) Signed: 05/04/2020 8:17:38 AM By: Gretta Cool, BSN, RN, CWS, Kim RN, BSN Entered By: Gretta Cool, BSN, RN, CWS, Kim on 05/04/2020 08:17:38

## 2020-05-07 ENCOUNTER — Inpatient Hospital Stay: Payer: Medicare HMO

## 2020-05-07 ENCOUNTER — Other Ambulatory Visit: Payer: Self-pay

## 2020-05-07 ENCOUNTER — Inpatient Hospital Stay: Payer: Medicare HMO | Attending: Internal Medicine

## 2020-05-07 VITALS — BP 137/79 | HR 70 | Temp 97.1°F | Resp 18 | Wt 195.0 lb

## 2020-05-07 DIAGNOSIS — M549 Dorsalgia, unspecified: Secondary | ICD-10-CM | POA: Diagnosis not present

## 2020-05-07 DIAGNOSIS — K859 Acute pancreatitis without necrosis or infection, unspecified: Secondary | ICD-10-CM | POA: Insufficient documentation

## 2020-05-07 DIAGNOSIS — N184 Chronic kidney disease, stage 4 (severe): Secondary | ICD-10-CM | POA: Insufficient documentation

## 2020-05-07 DIAGNOSIS — Z170 Estrogen receptor positive status [ER+]: Secondary | ICD-10-CM

## 2020-05-07 DIAGNOSIS — C7951 Secondary malignant neoplasm of bone: Secondary | ICD-10-CM | POA: Insufficient documentation

## 2020-05-07 DIAGNOSIS — Z806 Family history of leukemia: Secondary | ICD-10-CM | POA: Diagnosis not present

## 2020-05-07 DIAGNOSIS — Z5111 Encounter for antineoplastic chemotherapy: Secondary | ICD-10-CM | POA: Insufficient documentation

## 2020-05-07 DIAGNOSIS — Z8041 Family history of malignant neoplasm of ovary: Secondary | ICD-10-CM | POA: Diagnosis not present

## 2020-05-07 DIAGNOSIS — D72829 Elevated white blood cell count, unspecified: Secondary | ICD-10-CM | POA: Diagnosis not present

## 2020-05-07 DIAGNOSIS — Z833 Family history of diabetes mellitus: Secondary | ICD-10-CM | POA: Diagnosis not present

## 2020-05-07 DIAGNOSIS — R5383 Other fatigue: Secondary | ICD-10-CM | POA: Insufficient documentation

## 2020-05-07 DIAGNOSIS — Z808 Family history of malignant neoplasm of other organs or systems: Secondary | ICD-10-CM | POA: Insufficient documentation

## 2020-05-07 DIAGNOSIS — Z800 Family history of malignant neoplasm of digestive organs: Secondary | ICD-10-CM | POA: Diagnosis not present

## 2020-05-07 DIAGNOSIS — M2550 Pain in unspecified joint: Secondary | ICD-10-CM | POA: Insufficient documentation

## 2020-05-07 DIAGNOSIS — Z79899 Other long term (current) drug therapy: Secondary | ICD-10-CM | POA: Insufficient documentation

## 2020-05-07 DIAGNOSIS — B370 Candidal stomatitis: Secondary | ICD-10-CM | POA: Diagnosis not present

## 2020-05-07 DIAGNOSIS — Z836 Family history of other diseases of the respiratory system: Secondary | ICD-10-CM | POA: Diagnosis not present

## 2020-05-07 DIAGNOSIS — M869 Osteomyelitis, unspecified: Secondary | ICD-10-CM | POA: Insufficient documentation

## 2020-05-07 DIAGNOSIS — Z8673 Personal history of transient ischemic attack (TIA), and cerebral infarction without residual deficits: Secondary | ICD-10-CM | POA: Diagnosis not present

## 2020-05-07 DIAGNOSIS — Z803 Family history of malignant neoplasm of breast: Secondary | ICD-10-CM | POA: Diagnosis not present

## 2020-05-07 DIAGNOSIS — Z801 Family history of malignant neoplasm of trachea, bronchus and lung: Secondary | ICD-10-CM | POA: Insufficient documentation

## 2020-05-07 DIAGNOSIS — Z7189 Other specified counseling: Secondary | ICD-10-CM

## 2020-05-07 DIAGNOSIS — C50212 Malignant neoplasm of upper-inner quadrant of left female breast: Secondary | ICD-10-CM | POA: Diagnosis not present

## 2020-05-07 DIAGNOSIS — Z87891 Personal history of nicotine dependence: Secondary | ICD-10-CM | POA: Diagnosis not present

## 2020-05-07 DIAGNOSIS — R531 Weakness: Secondary | ICD-10-CM | POA: Insufficient documentation

## 2020-05-07 DIAGNOSIS — Z8249 Family history of ischemic heart disease and other diseases of the circulatory system: Secondary | ICD-10-CM | POA: Diagnosis not present

## 2020-05-07 LAB — CBC WITH DIFFERENTIAL/PLATELET
Abs Immature Granulocytes: 0.02 10*3/uL (ref 0.00–0.07)
Basophils Absolute: 0 10*3/uL (ref 0.0–0.1)
Basophils Relative: 0 %
Eosinophils Absolute: 0.1 10*3/uL (ref 0.0–0.5)
Eosinophils Relative: 2 %
HCT: 27.3 % — ABNORMAL LOW (ref 36.0–46.0)
Hemoglobin: 9.2 g/dL — ABNORMAL LOW (ref 12.0–15.0)
Immature Granulocytes: 1 %
Lymphocytes Relative: 18 %
Lymphs Abs: 0.7 10*3/uL (ref 0.7–4.0)
MCH: 29.6 pg (ref 26.0–34.0)
MCHC: 33.7 g/dL (ref 30.0–36.0)
MCV: 87.8 fL (ref 80.0–100.0)
Monocytes Absolute: 0.4 10*3/uL (ref 0.1–1.0)
Monocytes Relative: 10 %
Neutro Abs: 2.8 10*3/uL (ref 1.7–7.7)
Neutrophils Relative %: 69 %
Platelets: 261 10*3/uL (ref 150–400)
RBC: 3.11 MIL/uL — ABNORMAL LOW (ref 3.87–5.11)
RDW: 13.8 % (ref 11.5–15.5)
WBC: 4 10*3/uL (ref 4.0–10.5)
nRBC: 0.5 % — ABNORMAL HIGH (ref 0.0–0.2)

## 2020-05-07 LAB — BASIC METABOLIC PANEL
Anion gap: 8 (ref 5–15)
BUN: 40 mg/dL — ABNORMAL HIGH (ref 8–23)
CO2: 26 mmol/L (ref 22–32)
Calcium: 8.9 mg/dL (ref 8.9–10.3)
Chloride: 102 mmol/L (ref 98–111)
Creatinine, Ser: 2.42 mg/dL — ABNORMAL HIGH (ref 0.44–1.00)
GFR calc Af Amer: 24 mL/min — ABNORMAL LOW (ref >60)
GFR calc non Af Amer: 21 mL/min — ABNORMAL LOW (ref >60)
Glucose, Bld: 116 mg/dL — ABNORMAL HIGH (ref 70–99)
Potassium: 4 mmol/L (ref 3.5–5.1)
Sodium: 136 mmol/L (ref 135–145)

## 2020-05-07 MED ORDER — PROCHLORPERAZINE MALEATE 10 MG PO TABS
10.0000 mg | ORAL_TABLET | Freq: Once | ORAL | Status: AC
  Administered 2020-05-07: 10 mg via ORAL
  Filled 2020-05-07: qty 1

## 2020-05-07 MED ORDER — HEPARIN SOD (PORK) LOCK FLUSH 100 UNIT/ML IV SOLN
500.0000 [IU] | Freq: Once | INTRAVENOUS | Status: AC
  Administered 2020-05-07: 500 [IU] via INTRAVENOUS
  Filled 2020-05-07: qty 5

## 2020-05-07 MED ORDER — SODIUM CHLORIDE 0.9 % IV SOLN
1600.0000 mg | Freq: Once | INTRAVENOUS | Status: AC
  Administered 2020-05-07: 1600 mg via INTRAVENOUS
  Filled 2020-05-07: qty 26.3

## 2020-05-07 MED ORDER — HEPARIN SOD (PORK) LOCK FLUSH 100 UNIT/ML IV SOLN
INTRAVENOUS | Status: AC
  Filled 2020-05-07: qty 5

## 2020-05-07 MED ORDER — SODIUM CHLORIDE 0.9 % IV SOLN
Freq: Once | INTRAVENOUS | Status: AC
  Filled 2020-05-07: qty 250

## 2020-05-07 MED ORDER — SODIUM CHLORIDE 0.9% FLUSH
10.0000 mL | Freq: Once | INTRAVENOUS | Status: AC
  Administered 2020-05-07: 10 mL via INTRAVENOUS
  Filled 2020-05-07: qty 10

## 2020-05-08 DIAGNOSIS — M869 Osteomyelitis, unspecified: Secondary | ICD-10-CM | POA: Diagnosis not present

## 2020-05-08 DIAGNOSIS — I051 Rheumatic mitral insufficiency: Secondary | ICD-10-CM | POA: Diagnosis not present

## 2020-05-08 DIAGNOSIS — N184 Chronic kidney disease, stage 4 (severe): Secondary | ICD-10-CM | POA: Diagnosis not present

## 2020-05-08 DIAGNOSIS — I130 Hypertensive heart and chronic kidney disease with heart failure and stage 1 through stage 4 chronic kidney disease, or unspecified chronic kidney disease: Secondary | ICD-10-CM | POA: Diagnosis not present

## 2020-05-08 DIAGNOSIS — C50919 Malignant neoplasm of unspecified site of unspecified female breast: Secondary | ICD-10-CM | POA: Diagnosis not present

## 2020-05-08 DIAGNOSIS — E1169 Type 2 diabetes mellitus with other specified complication: Secondary | ICD-10-CM | POA: Diagnosis not present

## 2020-05-08 DIAGNOSIS — E1122 Type 2 diabetes mellitus with diabetic chronic kidney disease: Secondary | ICD-10-CM | POA: Diagnosis not present

## 2020-05-08 DIAGNOSIS — Z4781 Encounter for orthopedic aftercare following surgical amputation: Secondary | ICD-10-CM | POA: Diagnosis not present

## 2020-05-08 DIAGNOSIS — I509 Heart failure, unspecified: Secondary | ICD-10-CM | POA: Diagnosis not present

## 2020-05-09 DIAGNOSIS — M86172 Other acute osteomyelitis, left ankle and foot: Secondary | ICD-10-CM | POA: Diagnosis not present

## 2020-05-09 DIAGNOSIS — Z4781 Encounter for orthopedic aftercare following surgical amputation: Secondary | ICD-10-CM | POA: Diagnosis not present

## 2020-05-09 DIAGNOSIS — I051 Rheumatic mitral insufficiency: Secondary | ICD-10-CM | POA: Diagnosis not present

## 2020-05-09 DIAGNOSIS — I130 Hypertensive heart and chronic kidney disease with heart failure and stage 1 through stage 4 chronic kidney disease, or unspecified chronic kidney disease: Secondary | ICD-10-CM | POA: Diagnosis not present

## 2020-05-09 DIAGNOSIS — M869 Osteomyelitis, unspecified: Secondary | ICD-10-CM | POA: Diagnosis not present

## 2020-05-09 DIAGNOSIS — N184 Chronic kidney disease, stage 4 (severe): Secondary | ICD-10-CM | POA: Diagnosis not present

## 2020-05-09 DIAGNOSIS — L089 Local infection of the skin and subcutaneous tissue, unspecified: Secondary | ICD-10-CM | POA: Diagnosis not present

## 2020-05-09 DIAGNOSIS — E1169 Type 2 diabetes mellitus with other specified complication: Secondary | ICD-10-CM | POA: Diagnosis not present

## 2020-05-09 DIAGNOSIS — C50919 Malignant neoplasm of unspecified site of unspecified female breast: Secondary | ICD-10-CM | POA: Diagnosis not present

## 2020-05-09 DIAGNOSIS — E1122 Type 2 diabetes mellitus with diabetic chronic kidney disease: Secondary | ICD-10-CM | POA: Diagnosis not present

## 2020-05-09 DIAGNOSIS — E11628 Type 2 diabetes mellitus with other skin complications: Secondary | ICD-10-CM | POA: Diagnosis not present

## 2020-05-09 DIAGNOSIS — C50412 Malignant neoplasm of upper-outer quadrant of left female breast: Secondary | ICD-10-CM | POA: Diagnosis not present

## 2020-05-09 DIAGNOSIS — I509 Heart failure, unspecified: Secondary | ICD-10-CM | POA: Diagnosis not present

## 2020-05-10 DIAGNOSIS — I130 Hypertensive heart and chronic kidney disease with heart failure and stage 1 through stage 4 chronic kidney disease, or unspecified chronic kidney disease: Secondary | ICD-10-CM | POA: Diagnosis not present

## 2020-05-10 DIAGNOSIS — Z4781 Encounter for orthopedic aftercare following surgical amputation: Secondary | ICD-10-CM | POA: Diagnosis not present

## 2020-05-10 DIAGNOSIS — C50919 Malignant neoplasm of unspecified site of unspecified female breast: Secondary | ICD-10-CM | POA: Diagnosis not present

## 2020-05-10 DIAGNOSIS — E1169 Type 2 diabetes mellitus with other specified complication: Secondary | ICD-10-CM | POA: Diagnosis not present

## 2020-05-10 DIAGNOSIS — I509 Heart failure, unspecified: Secondary | ICD-10-CM | POA: Diagnosis not present

## 2020-05-10 DIAGNOSIS — E1122 Type 2 diabetes mellitus with diabetic chronic kidney disease: Secondary | ICD-10-CM | POA: Diagnosis not present

## 2020-05-10 DIAGNOSIS — M869 Osteomyelitis, unspecified: Secondary | ICD-10-CM | POA: Diagnosis not present

## 2020-05-10 DIAGNOSIS — I051 Rheumatic mitral insufficiency: Secondary | ICD-10-CM | POA: Diagnosis not present

## 2020-05-10 DIAGNOSIS — N184 Chronic kidney disease, stage 4 (severe): Secondary | ICD-10-CM | POA: Diagnosis not present

## 2020-05-11 ENCOUNTER — Encounter: Payer: Medicare HMO | Admitting: Physician Assistant

## 2020-05-11 ENCOUNTER — Other Ambulatory Visit: Payer: Self-pay

## 2020-05-11 DIAGNOSIS — C50919 Malignant neoplasm of unspecified site of unspecified female breast: Secondary | ICD-10-CM | POA: Diagnosis not present

## 2020-05-11 DIAGNOSIS — I130 Hypertensive heart and chronic kidney disease with heart failure and stage 1 through stage 4 chronic kidney disease, or unspecified chronic kidney disease: Secondary | ICD-10-CM | POA: Diagnosis not present

## 2020-05-11 DIAGNOSIS — B370 Candidal stomatitis: Secondary | ICD-10-CM | POA: Diagnosis not present

## 2020-05-11 DIAGNOSIS — Z4781 Encounter for orthopedic aftercare following surgical amputation: Secondary | ICD-10-CM | POA: Diagnosis not present

## 2020-05-11 DIAGNOSIS — J811 Chronic pulmonary edema: Secondary | ICD-10-CM | POA: Diagnosis not present

## 2020-05-11 DIAGNOSIS — Z89422 Acquired absence of other left toe(s): Secondary | ICD-10-CM | POA: Diagnosis not present

## 2020-05-11 DIAGNOSIS — J189 Pneumonia, unspecified organism: Secondary | ICD-10-CM | POA: Diagnosis not present

## 2020-05-11 DIAGNOSIS — E11621 Type 2 diabetes mellitus with foot ulcer: Secondary | ICD-10-CM | POA: Diagnosis not present

## 2020-05-11 DIAGNOSIS — N184 Chronic kidney disease, stage 4 (severe): Secondary | ICD-10-CM | POA: Diagnosis not present

## 2020-05-11 DIAGNOSIS — E1169 Type 2 diabetes mellitus with other specified complication: Secondary | ICD-10-CM | POA: Diagnosis not present

## 2020-05-11 DIAGNOSIS — J90 Pleural effusion, not elsewhere classified: Secondary | ICD-10-CM | POA: Diagnosis not present

## 2020-05-11 DIAGNOSIS — C7951 Secondary malignant neoplasm of bone: Secondary | ICD-10-CM | POA: Diagnosis not present

## 2020-05-11 DIAGNOSIS — I509 Heart failure, unspecified: Secondary | ICD-10-CM | POA: Diagnosis not present

## 2020-05-11 DIAGNOSIS — I051 Rheumatic mitral insufficiency: Secondary | ICD-10-CM | POA: Diagnosis not present

## 2020-05-11 DIAGNOSIS — M869 Osteomyelitis, unspecified: Secondary | ICD-10-CM | POA: Diagnosis not present

## 2020-05-11 DIAGNOSIS — E1122 Type 2 diabetes mellitus with diabetic chronic kidney disease: Secondary | ICD-10-CM | POA: Diagnosis not present

## 2020-05-11 DIAGNOSIS — E11628 Type 2 diabetes mellitus with other skin complications: Secondary | ICD-10-CM | POA: Diagnosis not present

## 2020-05-11 DIAGNOSIS — J029 Acute pharyngitis, unspecified: Secondary | ICD-10-CM | POA: Diagnosis not present

## 2020-05-11 DIAGNOSIS — R0602 Shortness of breath: Secondary | ICD-10-CM | POA: Diagnosis not present

## 2020-05-11 DIAGNOSIS — T8189XA Other complications of procedures, not elsewhere classified, initial encounter: Secondary | ICD-10-CM | POA: Diagnosis not present

## 2020-05-11 DIAGNOSIS — I5022 Chronic systolic (congestive) heart failure: Secondary | ICD-10-CM | POA: Diagnosis not present

## 2020-05-11 DIAGNOSIS — L089 Local infection of the skin and subcutaneous tissue, unspecified: Secondary | ICD-10-CM | POA: Diagnosis not present

## 2020-05-11 DIAGNOSIS — Z794 Long term (current) use of insulin: Secondary | ICD-10-CM | POA: Diagnosis not present

## 2020-05-11 NOTE — Progress Notes (Signed)
Gloria, [name] ([id number]) Visit Report for 05/11/2020 Arrival Information Details Patient Name: [name], [name] Date of Service: 05/11/2020 3:15 PM Medical Record Number: [id number] Patient Account Number: [id number] Date of Birth/Sex: 05-20-1956 (64 y.o. F) Treating RN: Darci Needle Primary Care Monnie Gudgel: [name] Other Clinician: Referring Tevan Marian: [name] Treating Isadora Delorey/Extender: Melburn Hake, HOYT Weeks in Treatment: 1 Visit Information History Since Last Visit All ordered tests and consults were completed: No Patient Arrived: Wheel Chair Added or deleted any medications: No Arrival Time: 15:36 Any new allergies or adverse reactions: No Accompanied By: friend Had a fall or experienced change in No Transfer Assistance: None activities of daily living that may affect Patient Requires Transmission-Based Precautions: No risk of falls: Patient Has Alerts: No Signs or symptoms of abuse/neglect since last visito No Hospitalized since last visit: No Implantable device outside of the clinic excluding No cellular tissue based products placed in the center since last visit: Has Dressing in Place as Prescribed: Yes Pain Present Now: No Electronic Signature(s) Signed: 05/11/2020 4:14:36 PM By: Darci Needle Entered By: Darci Needle on 05/11/2020 15:37:05 [name] ([id number]) -------------------------------------------------------------------------------- Clinic Level of Care Assessment Details Patient Name: Gloria Both A. Date of Service: 05/11/2020 3:15 PM Medical Record Number: [id number] Patient Account Number: [id number] Date of Birth/Sex: Mar 20, 1956 (64 y.o. F) Treating RN: Grover Canavan Primary Care Mera Gunkel: [name] Other Clinician: Referring Shaylea Ucci: [name] Treating Vonceil Upshur/Extender: Melburn Hake, HOYT Weeks in Treatment: 1 Clinic Level of Care Assessment Items TOOL 4 Quantity Score []  - Use when only an EandM  is performed on FOLLOW-UP visit 0 ASSESSMENTS - Nursing Assessment / Reassessment X - Reassessment of Co-morbidities (includes updates in patient status) 1 10 X- 1 5 Reassessment of Adherence to Treatment Plan ASSESSMENTS - Wound and Skin Assessment / Reassessment X - Simple Wound Assessment / Reassessment - one wound 1 5 []  - 0 Complex Wound Assessment / Reassessment - multiple wounds []  - 0 Dermatologic / Skin Assessment (not related to wound area) ASSESSMENTS - Focused Assessment []  - Circumferential Edema Measurements - multi extremities 0 []  - 0 Nutritional Assessment / Counseling / Intervention X- 1 5 Lower Extremity Assessment (monofilament, tuning fork, pulses) []  - 0 Peripheral Arterial Disease Assessment (using hand held doppler) ASSESSMENTS - Ostomy and/or Continence Assessment and Care []  - Incontinence Assessment and Management 0 []  - 0 Ostomy Care Assessment and Management (repouching, etc.) PROCESS - Coordination of Care X - Simple Patient / Family Education for ongoing care 1 15 []  - 0 Complex (extensive) Patient / Family Education for ongoing care []  - 0 Staff obtains Programmer, systems, Records, Test Results / Process Orders []  - 0 Staff telephones HHA, Nursing Homes / Clarify orders / etc []  - 0 Routine Transfer to another Facility (non-emergent condition) []  - 0 Routine Hospital Admission (non-emergent condition) []  - 0 New Admissions / Biomedical engineer / Ordering NPWT, Apligraf, etc. []  - 0 Emergency Hospital Admission (emergent condition) X- 1 10 Simple Discharge Coordination []  - 0 Complex (extensive) Discharge Coordination PROCESS - Special Needs []  - Pediatric / Minor Patient Management 0 []  - 0 Isolation Patient Management []  - 0 Hearing / Language / Visual special needs []  - 0 Assessment of Community assistance (transportation, D/C planning, etc.) []  - 0 Additional assistance / Altered mentation []  - 0 Support Surface(s) Assessment (bed,  cushion, seat, etc.) INTERVENTIONS - Wound Cleansing / Measurement Zurawski, Nakira A. ([id number]) X- 1 5 Simple Wound Cleansing - one wound []  - 0 Complex Wound Cleansing -  multiple wounds X- 1 5 Wound Imaging (photographs - any number of wounds) []  - 0 Wound Tracing (instead of photographs) X- 1 5 Simple Wound Measurement - one wound []  - 0 Complex Wound Measurement - multiple wounds INTERVENTIONS - Wound Dressings X - Small Wound Dressing one or multiple wounds 1 10 []  - 0 Medium Wound Dressing one or multiple wounds []  - 0 Large Wound Dressing one or multiple wounds []  - 0 Application of Medications - topical []  - 0 Application of Medications - injection INTERVENTIONS - Miscellaneous []  - External ear exam 0 []  - 0 Specimen Collection (cultures, biopsies, blood, body fluids, etc.) []  - 0 Specimen(s) / Culture(s) sent or taken to Lab for analysis []  - 0 Patient Transfer (multiple staff / Civil Service fast streamer / Similar devices) []  - 0 Simple Staple / Suture removal (25 or less) []  - 0 Complex Staple / Suture removal (26 or more) []  - 0 Hypo / Hyperglycemic Management (close monitor of Blood Glucose) []  - 0 Ankle / Brachial Index (ABI) - do not check if billed separately X- 1 5 Vital Signs Has the patient been seen at the hospital within the last three years: Yes Total Score: 80 Level Of Care: New/Established - Level 3 Electronic Signature(s) Signed: 05/11/2020 4:37:12 PM By: Grover Canavan Entered By: Grover Canavan on 05/11/2020 15:50:00 [name] -------------------------------------------------------------------------------- Encounter Discharge Information Details Patient Name: Gloria Both A. Date of Service: 05/11/2020 3:15 PM Medical Record Number: [id number] Patient Account Number: [id number] Date of Birth/Sex: 03-05-1956 (64 y.o. F) Treating RN: Grover Canavan Primary Care Chanon Loney: [name] Other Clinician: Referring Lyncoln Maskell:  [name] Treating Chiyoko Torrico/Extender: Melburn Hake, HOYT Weeks in Treatment: 1 Encounter Discharge Information Items Discharge Condition: Stable Ambulatory Status: Wheelchair Discharge Destination: Home Transportation: Other Accompanied By: self Schedule Follow-up Appointment: Yes Clinical Summary of Care: Electronic Signature(s) Signed: 05/11/2020 4:37:12 PM By: Grover Canavan Entered By: Grover Canavan on 05/11/2020 15:50:44 Zenor, Beverley [name] ([id number]) -------------------------------------------------------------------------------- Lower Extremity Assessment Details Patient Name: Gloria Both A. Date of Service: 05/11/2020 3:15 PM Medical Record Number: [id number] Patient Account Number: [id number] Date of Birth/Sex: [birth year] (64 y.o. F) Treating RN: Darci Needle Primary Care Fredric Slabach: [name] Other Clinician: Referring Dejia Ebron: [name] Treating Adedamola Seto/Extender: Melburn Hake, HOYT Weeks in Treatment: 1 Edema Assessment Assessed: [Left: Yes] [Right: No] Edema: [Left: Ye] [Right: s] Calf Left: Right: Point of Measurement: 26 cm From Medial Instep 38.5 cm Ankle Left: Right: Point of Measurement: 12 cm From Medial Instep 32 cm Vascular Assessment Pulses: Dorsalis Pedis Palpable: [Left:Yes] Posterior Tibial Palpable: [Left:Yes] Electronic Signature(s) Signed: 05/11/2020 4:14:36 PM By: Darci Needle Entered By: Darci Needle on 05/11/2020 15:39:30 Wojcicki, Skyelyn AMarland Kitchen ([id number]) -------------------------------------------------------------------------------- Multi Wound Chart Details Patient Name: Gloria Both A. Date of Service: 05/11/2020 3:15 PM Medical Record Number: [id number] Patient Account Number: [id number] Date of Birth/Sex: 1956/03/28 (64 y.o. F) Treating RN: Grover Canavan Primary Care Modine Oppenheimer: [name] Other Clinician: Referring Ahamed Hofland: [name] Treating Nilesh Stegall/Extender: Melburn Hake, HOYT Weeks in  Treatment: 1 Vital Signs Height(in): 62 Pulse(bpm): 73 Weight(lbs): 196 Blood Pressure(mmHg): 116/73 Body Mass Index(BMI): 36 Temperature(F): 97.6 Respiratory Rate(breaths/min): 24 Photos: [N/A:N/A] Wound Location: Left, Lateral Foot N/A N/A Wounding Event: Surgical Injury N/A N/A Primary Etiology: Diabetic Wound/Ulcer of the Lower N/A N/A Extremity Comorbid History: Cataracts, Asthma, Congestive N/A N/A Heart Failure, Hypertension, Type II Diabetes, End Stage Renal Disease, History of pressure wounds, Received Chemotherapy Date Acquired: 04/06/2020 N/A N/A Weeks of Treatment: 1 N/A N/A Wound Status: Open N/A N/A  Measurements L x W x D (cm) 6.6x0.2x0.2 N/A N/A Area (cm) : 1.037 N/A N/A Volume (cm) : 0.207 N/A N/A % Reduction in Area: -140.00% N/A N/A % Reduction in Volume: -381.40% N/A N/A Classification: Unable to visualize wound bed N/A N/A Exudate Amount: Small N/A N/A Exudate Type: Serosanguineous N/A N/A Exudate Color: red, brown N/A N/A Wound Margin: Distinct, outline attached N/A N/A Granulation Amount: None Present (0%) N/A N/A Necrotic Amount: Large (67-100%) N/A N/A Exposed Structures: Fat Layer (Subcutaneous Tissue): N/A N/A Yes Fascia: No Tendon: No Muscle: No Joint: No Bone: No Epithelialization: None N/A N/A Treatment Notes Electronic Signature(s) Signed: 05/11/2020 4:37:12 PM By: Grover Canavan Entered By: Grover Canavan on 05/11/2020 15:44:38 Deblanc, Beverley [name] ([id number]) -------------------------------------------------------------------------------- [place] Details Patient Name: Gloria Both A. Date of Service: 05/11/2020 3:15 PM Medical Record Number: [id number] Patient Account Number: [id number] Date of Birth/Sex: Oct 20, 1955 (64 y.o. F) Treating RN: Grover Canavan Primary Care Jyl Chico: [name] Other Clinician: Referring Ogechi Kuehnel: [name] Treating Nelsy Madonna/Extender: Melburn Hake, HOYT Weeks in  Treatment: 1 Active Inactive Wound/Skin Impairment Nursing Diagnoses: Impaired tissue integrity Knowledge deficit related to ulceration/compromised skin integrity Goals: Patient/caregiver will verbalize understanding of skin care regimen Date Initiated: 05/04/2020 Target Resolution Date: 05/13/2020 Goal Status: Active Interventions: Assess patient/caregiver ability to obtain necessary supplies Assess patient/caregiver ability to perform ulcer/skin care regimen upon admission and as needed Provide education on ulcer and skin care Notes: Electronic Signature(s) Signed: 05/11/2020 4:37:12 PM By: Grover Canavan Entered By: Grover Canavan on 05/11/2020 15:44:12 Manuele, Azelie A. ([id number]) -------------------------------------------------------------------------------- Pain Assessment Details Patient Name: Gloria Both A. Date of Service: 05/11/2020 3:15 PM Medical Record Number: [id number] Patient Account Number: [id number] Date of Birth/Sex: 1956-06-13 (64 y.o. F) Treating RN: Darci Needle Primary Care Leamon Palau: [name] Other Clinician: Referring Kaylor Simenson: [name] Treating Dominga Mcduffie/Extender: Melburn Hake, HOYT Weeks in Treatment: 1 Active Problems Location of Pain Severity and Description of Pain Patient Has Paino No Site Locations With Dressing Change: No Pain Management and Medication Current Pain Management: Electronic Signature(s) Signed: 05/11/2020 4:14:36 PM By: Darci Needle Entered By: Darci Needle on 05/11/2020 15:37:49 Doiron, Beverley [name] ([id number]) -------------------------------------------------------------------------------- Patient/Caregiver Education Details Patient Name: Gloria Both A. Date of Service: 05/11/2020 3:15 PM Medical Record Number: [id number] Patient Account Number: [id number] Date of Birth/Gender: Feb 12, 1956 (64 y.o. F) Treating RN: Grover Canavan Primary Care Physician: [name] Other Clinician: Referring  Physician: [name] Treating Physician/Extender: Sharalyn Ink in Treatment: 1 Education Assessment Education Provided To: Patient Education Topics Provided Wound/Skin Impairment: Methods: Explain/Verbal Responses: State content correctly Electronic Signature(s) Signed: 05/11/2020 4:37:12 PM By: Grover Canavan Entered By: Grover Canavan on 05/11/2020 15:44:51 Mitton, Jenese AMarland Kitchen ([id number]) -------------------------------------------------------------------------------- Wound Assessment Details Patient Name: Gloria Both A. Date of Service: 05/11/2020 3:15 PM Medical Record Number: [id number] Patient Account Number: [id number] Date of Birth/Sex: 1955-09-23 (64 y.o. F) Treating RN: Darci Needle Primary Care Chaynce Schafer: [name] Other Clinician: Referring Sheneika Walstad: [name] Treating Khalidah Herbold/Extender: Melburn Hake, HOYT Weeks in Treatment: 1 Wound Status Wound Number: 4 Primary Diabetic Wound/Ulcer of the Lower Extremity Etiology: Wound Location: Left, Lateral Foot Wound Open Wounding Event: Surgical Injury Status: Date Acquired: 04/06/2020 Comorbid Cataracts, Asthma, Congestive Heart Failure, Weeks Of Treatment: 1 History: Hypertension, Type II Diabetes, End Stage Renal Disease, Clustered Wound: No History of pressure wounds, Received Chemotherapy Photos Wound Measurements Length: (cm) 6.6 Width: (cm) 0.2 Depth: (cm) 0.2 Area: (cm) 1.037 Volume: (cm) 0.207 % Reduction in Area: -140% % Reduction in Volume: -381.4% Epithelialization: None Wound Description  Classification: Unable to visualize wound bed Foul Wound Margin: Distinct, outline attached Slou Exudate Amount: Small Exudate Type: Serosanguineous Exudate Color: red, brown Odor After Cleansing: No gh/Fibrino No Wound Bed Granulation Amount: None Present (0%) Exposed Structure Necrotic Amount: Large (67-100%) Fascia Exposed: No Necrotic Quality: Adherent Slough Fat Layer  (Subcutaneous Tissue) Exposed: Yes Tendon Exposed: No Muscle Exposed: No Joint Exposed: No Bone Exposed: No Treatment Notes Wound #4 (Left, Lateral Foot) Notes scell, abd, conform, ace wrap Electronic Signature(s) SHELBA, SUSI ([id number]) Signed: 05/11/2020 4:14:36 PM By: Darci Needle Entered By: Darci Needle on 05/11/2020 15:38:55 Winiecki, Beverley [name] ([id number]) -------------------------------------------------------------------------------- Vitals Details Patient Name: Gloria Both A. Date of Service: 05/11/2020 3:15 PM Medical Record Number: [id number] Patient Account Number: [id number] Date of Birth/Sex: [birth year] (64 y.o. F) Treating RN: Darci Needle Primary Care Sears Oran: [name] Other Clinician: Referring Marysol Wellnitz: [name] Treating Micky Sheller/Extender: Melburn Hake, HOYT Weeks in Treatment: 1 Vital Signs Time Taken: 03:20 Temperature (F): 97.6 Height (in): 62 Pulse (bpm): 73 Weight (lbs): 196 Respiratory Rate (breaths/min): 24 Body Mass Index (BMI): 35.8 Blood Pressure (mmHg): 116/73 Reference Range: 80 - 120 mg / dl Electronic Signature(s) Signed: 05/11/2020 4:14:36 PM By: Darci Needle Entered By: Darci Needle on 05/11/2020 15:37:38

## 2020-05-11 NOTE — Progress Notes (Addendum)
[name], [name] ([id number]) Visit Report for 05/11/2020 Chief Complaint Document Details Patient Name: Gloria, LEAVY [name]. Date of Service: 05/11/2020 3:15 PM Medical Record Number: [id number] Patient Account Number: [id number] Date of Birth/Sex: [birth year] (64 y.o. F) Treating RN: Grover Canavan Primary Care Provider: [name] Other Clinician: Referring Provider: [name] Treating Provider/Extender: Melburn Hake, Arelis Neumeier Weeks in Treatment: 1 Information Obtained from: Patient Chief Complaint Left foot surgical ulcer Electronic Signature(s) Signed: 05/11/2020 3:27:54 PM By: Worthy Keeler PA-C Entered By: Worthy Keeler on 05/11/2020 15:27:54 [name], [name] -------------------------------------------------------------------------------- HPI Details Patient Name: [name] Date of Service: 05/11/2020 3:15 PM Medical Record Number: [id number] Patient Account Number: [id number] Date of Birth/Sex: [birth year] (64 y.o. F) Treating RN: Grover Canavan Primary Care Provider: [name] Other Clinician: Referring Provider: [name] Treating Provider/Extender: Melburn Hake, Dorrene Bently Weeks in Treatment: 1 History of Present Illness HPI Description: 12/27/2019 upon evaluation today patient appears to be doing somewhat poorly upon initial inspection here in the clinic. She has unfortunately been having issues with for the past week [name] blister over her right heel that started on the plantar aspect and has spread to the medial aspect. She does have [name] history of diabetes mellitus type 2 she also has hypertension along with congestive heart failure. With that being said she was supposed to be having knee surgery on her right knee but this was postponed by the surgeon and she was referred to [place] due to the blister noted. Fortunately there is no signs of active infection at this time. With that being said I am concerned about the fact that this could develop into  infection. Obviously we want to prevent such from happening. She does note that she been placed on [name] antibiotic for urinary tract infection by her primary care provider she has that to pick up. I asked her to call and let us know what that antibiotic was so that we can put that in the chart. For that reason I am not can give her anything empirically at this point. The patient cannot remember any injury that she had to this region in fact she really does not know how this began at all other than the fact that it "just showed up". 01/05/2020 upon evaluation today patient appears to be doing about the same in regard to her heel ulcer. She has been tolerating the dressing changes without complication. Fortunately there is no signs of active infection at this time. No fevers, chills, nausea, vomiting, or diarrhea. With that being said I do believe the skin is getting need to be removed from the heel where this is somewhat deflated there is still [name] lot of blood collecting underneath and I think this is not can I do her any good to be perfectly honest. Plus we do not really know exactly what everything looks like underneath as far as the actual wound is concerned. Obviously we need to figure that out. 01/12/2020 upon evaluation today patient's wound actually appears to be doing excellent in fact this is very close to complete resolution. Subsequently I also did send [name] culture after removing the blood blister last week and the patient did not have any bacteria noted just normal skin flora and no growth otherwise after 2 days. Fortunately there is no signs of active infection at this time systemically or locally. Overall I feel that she is doing excellent. 01/19/2020 upon evaluation today patient appears to be doing excellent at this time in regard to her heel  ulcer for the most part. The one issue that we do see is that she is having [name] new blistered area that amount to clear away some of the skin from today. This  seems to be due to her foot slipping in the offloading shoe that she currently is utilizing. Fortunately there is no signs of active infection at this time. No fevers, chills, nausea, vomiting, or diarrhea. 01/26/2020 upon evaluation today patient's wound actually showed signs of excellent improvement. She has made great progress even since last week's visit. I do believe that she continues to tolerate the dressing changes without any complication and I am very pleased with that as well. In general I think that we will likely continue with the current measures as long as we are continuing to see the results that we are at this point. 02/02/2020 upon evaluation today patient appears to be doing excellent in regard to her wounds currently. She has been tolerating the dressing changes without complication in fact this appears to be completely healed today based on what I am seeing. Readmission: 03/26/2020 on evaluation today patient presents for readmission concerning an issue that she is actually having with her opposite foot compared to the one that I treated last time she was with this back in July when she healed. Subsequently she tells me that she really does not know how this came up. She does not have any particular injury that she is aware of at this point. With that being said she tells me that the area came up somewhat unexpectedly and has been dark and similar to what she had before. She has had some drainage from it as well. She has been on Keflex initially for urinary tract infection then her doctor actually gave her [name] second round due to the heel itself in order to try to help with this. Nonetheless she still has erythema and warmth of the foot I am concerned that the Keflex is not the appropriate medication. Previously we used Augmentin but that is very similar to the Keflex to be honest. I am not really sure if that would be the best option for her at this time. Nonetheless she probably  needs something oral while we await the results of the culture and then if we need to make any adjustments or changes we can do so at that point. I do need to keep her kidney function in consideration with this. 05/04/2020 upon evaluation today patient presents for reevaluation here in the clinic after having had surgery as performed by Dr. Vickki Muff. She actually goes back to see him this coming week on October 5. She still has sutures in place at the moment currently he has recommended Betadine to the wound area followed by dry gauze dressing and cleansing with saline. Her surgery was roughly 4 weeks ago. She tells me that she is really not having any significant pain which is great news. Overall I am pleased with where things stand. I do believe that once the sutures are out this may potentially slightly dehisced nonetheless I think that we should be able to hopefully get this to close effectively without much complication. However time will tell we will see what happens once the sutures are indeed removed. 05/11/2020 upon evaluation today patient appears to be doing decently well in regard to her foot ulcer. Fortunately there is no signs of active infection at this time. Actually appears to be healing okay and the sutures have been removed at this point. I do  believe talking the alginate into the crease where this is healing will be appropriate to try to keep this area nice and clean and allow it to hopefully continue to heal. Fortunately there is no signs of active infection. Electronic Signature(s) Signed: 05/11/2020 3:51:06 PM By: Worthy Keeler PA-C Entered By: Worthy Keeler on 05/11/2020 15:51:06 Magro, [name] ([id number]) -------------------------------------------------------------------------------- Physical Exam Details Patient Name: [name] Date of Service: 05/11/2020 3:15 PM Medical Record Number: [id number] Patient Account Number: [id number] Date of Birth/Sex: 06-Jul-1956 (64  y.o. F) Treating RN: Grover Canavan Primary Care Provider: [name] Other Clinician: Referring Provider: [name] Treating Provider/Extender: Melburn Hake, Havard Radigan Weeks in Treatment: 1 Notes Upon inspection patient's wound actually showed signs of good granulation at this time there does not appear to be any evidence of active infection which is great news and overall I am extremely pleased with where things stand today. Electronic Signature(s) Signed: 05/11/2020 3:55:20 PM By: Worthy Keeler PA-C Entered By: Worthy Keeler on 05/11/2020 15:55:19 Conerly, [name] ([id number]) -------------------------------------------------------------------------------- Physician Orders Details Patient Name: [name] Date of Service: 05/11/2020 3:15 PM Medical Record Number: [id number] Patient Account Number: [id number] Date of Birth/Sex: October 16, 1955 (64 y.o. F) Treating RN: Grover Canavan Primary Care Provider: [name] Other Clinician: Referring Provider: [name] Treating Provider/Extender: Melburn Hake, Kamee Bobst Weeks in Treatment: 1 Verbal / Phone Orders: No Diagnosis Coding ICD-10 Coding Code Description E11.621 Type 2 diabetes mellitus with foot ulcer L97.523 Non-pressure chronic ulcer of other part of left foot with necrosis of muscle I10 Essential (primary) hypertension I50.42 Chronic combined systolic (congestive) and diastolic (congestive) heart failure Wound Cleansing Wound #4 Left,Lateral Foot o Dial antibacterial soap, wash wounds, rinse and pat dry prior to dressing wounds Primary Wound Dressing Wound #4 Left,Lateral Foot o Silver Alginate Secondary Dressing Wound #4 Left,Lateral Foot o ABD and Kerlix/Conform o Other - ace wrap to hold all in place Dressing Change Frequency Wound #4 Left,Lateral Foot o Change dressing every other day. Follow-up Appointments Wound #4 Left,Lateral Foot o Return Appointment in 2 weeks. Electronic  Signature(s) Signed: 05/11/2020 4:37:12 PM By: Grover Canavan Signed: 05/11/2020 4:45:56 PM By: Worthy Keeler PA-C Entered By: Grover Canavan on 05/11/2020 15:49:26 He, [name] ([id number]) -------------------------------------------------------------------------------- Problem List Details Patient Name: [name] Date of Service: 05/11/2020 3:15 PM Medical Record Number: [id number] Patient Account Number: [id number] Date of Birth/Sex: 1955-10-13 (64 y.o. F) Treating RN: Grover Canavan Primary Care Provider: [name] Other Clinician: Referring Provider: [name] Treating Provider/Extender: Melburn Hake, Maryori Weide Weeks in Treatment: 1 Active Problems ICD-10 Encounter Code Description Active Date MDM Diagnosis E11.621 Type 2 diabetes mellitus with foot ulcer 05/04/2020 No Yes L97.523 Non-pressure chronic ulcer of other part of left foot with necrosis of 05/04/2020 No Yes muscle I10 Essential (primary) hypertension 05/04/2020 No Yes I50.42 Chronic combined systolic (congestive) and diastolic (congestive) heart 05/04/2020 No Yes failure Inactive Problems Resolved Problems Electronic Signature(s) Signed: 05/11/2020 3:27:18 PM By: Worthy Keeler PA-C Entered By: Worthy Keeler on 05/11/2020 15:27:17 Quinlivan, Makayla AMarland Kitchen ([id number]) -------------------------------------------------------------------------------- Progress Note Details Patient Name: [name] Date of Service: 05/11/2020 3:15 PM Medical Record Number: [id number] Patient Account Number: [id number] Date of Birth/Sex: August 17, 1955 (64 y.o. F) Treating RN: Grover Canavan Primary Care Provider: [name] Other Clinician: Referring Provider: [name] Treating Provider/Extender: Melburn Hake, Sabree Nuon Weeks in Treatment: 1 Subjective Chief Complaint Information obtained from Patient Left foot surgical ulcer History of Present Illness (HPI) 12/27/2019 upon evaluation today patient  appears  to be doing somewhat poorly upon initial inspection here in the clinic. She has unfortunately been having issues with for the past week [name] blister over her right heel that started on the plantar aspect and has spread to the medial aspect. She does have [name] history of diabetes mellitus type 2 she also has hypertension along with congestive heart failure. With that being said she was supposed to be having knee surgery on her right knee but this was postponed by the surgeon and she was referred to [place] due to the blister noted. Fortunately there is no signs of active infection at this time. With that being said I am concerned about the fact that this could develop into infection. Obviously we want to prevent such from happening. She does note that she been placed on [name] antibiotic for urinary tract infection by her primary care provider she has that to pick up. I asked her to call and let us know what that antibiotic was so that we can put that in the chart. For that reason I am not can give her anything empirically at this point. The patient cannot remember any injury that she had to this region in fact she really does not know how this began at all other than the fact that it "just showed up". 01/05/2020 upon evaluation today patient appears to be doing about the same in regard to her heel ulcer. She has been tolerating the dressing changes without complication. Fortunately there is no signs of active infection at this time. No fevers, chills, nausea, vomiting, or diarrhea. With that being said I do believe the skin is getting need to be removed from the heel where this is somewhat deflated there is still [name] lot of blood collecting underneath and I think this is not can I do her any good to be perfectly honest. Plus we do not really know exactly what everything looks like underneath as far as the actual wound is concerned. Obviously we need to figure that out. 01/12/2020 upon evaluation today patient's wound  actually appears to be doing excellent in fact this is very close to complete resolution. Subsequently I also did send [name] culture after removing the blood blister last week and the patient did not have any bacteria noted just normal skin flora and no growth otherwise after 2 days. Fortunately there is no signs of active infection at this time systemically or locally. Overall I feel that she is doing excellent. 01/19/2020 upon evaluation today patient appears to be doing excellent at this time in regard to her heel ulcer for the most part. The one issue that we do see is that she is having [name] new blistered area that amount to clear away some of the skin from today. This seems to be due to her foot slipping in the offloading shoe that she currently is utilizing. Fortunately there is no signs of active infection at this time. No fevers, chills, nausea, vomiting, or diarrhea. 01/26/2020 upon evaluation today patient's wound actually showed signs of excellent improvement. She has made great progress even since last week's visit. I do believe that she continues to tolerate the dressing changes without any complication and I am very pleased with that as well. In general I think that we will likely continue with the current measures as long as we are continuing to see the results that we are at this point. 02/02/2020 upon evaluation today patient appears to be doing excellent in regard to her wounds currently.  She has been tolerating the dressing changes without complication in fact this appears to be completely healed today based on what I am seeing. Readmission: 03/26/2020 on evaluation today patient presents for readmission concerning an issue that she is actually having with her opposite foot compared to the one that I treated last time she was with this back in July when she healed. Subsequently she tells me that she really does not know how this came up. She does not have any particular injury that she is  aware of at this point. With that being said she tells me that the area came up somewhat unexpectedly and has been dark and similar to what she had before. She has had some drainage from it as well. She has been on Keflex initially for urinary tract infection then her doctor actually gave her [name] second round due to the heel itself in order to try to help with this. Nonetheless she still has erythema and warmth of the foot I am concerned that the Keflex is not the appropriate medication. Previously we used Augmentin but that is very similar to the Keflex to be honest. I am not really sure if that would be the best option for her at this time. Nonetheless she probably needs something oral while we await the results of the culture and then if we need to make any adjustments or changes we can do so at that point. I do need to keep her kidney function in consideration with this. 05/04/2020 upon evaluation today patient presents for reevaluation here in the clinic after having had surgery as performed by Dr. Vickki Muff. She actually goes back to see him this coming week on October 5. She still has sutures in place at the moment currently he has recommended Betadine to the wound area followed by dry gauze dressing and cleansing with saline. Her surgery was roughly 4 weeks ago. She tells me that she is really not having any significant pain which is great news. Overall I am pleased with where things stand. I do believe that once the sutures are out this may potentially slightly dehisced nonetheless I think that we should be able to hopefully get this to close effectively without much complication. However time will tell we will see what happens once the sutures are indeed removed. 05/11/2020 upon evaluation today patient appears to be doing decently well in regard to her foot ulcer. Fortunately there is no signs of active infection at this time. Actually appears to be healing okay and the sutures have been removed at  this point. I do believe talking the alginate into the crease where this is healing will be appropriate to try to keep this area nice and clean and allow it to hopefully continue to heal. Fortunately there is no signs of active infection. Mczeal, Krishna [name]. ([id number]) Objective Constitutional Vitals Time Taken: 3:20 AM, Height: 62 in, Weight: 196 lbs, BMI: 35.8, Temperature: 97.6 F, Pulse: 73 bpm, Respiratory Rate: 24 breaths/min, Blood Pressure: 116/73 mmHg. Integumentary (Hair, Skin) Wound #4 status is Open. Original cause of wound was Surgical Injury. The wound is located on the Left,Lateral Foot. The wound measures 6.6cm length x 0.2cm width x 0.2cm depth; 1.037cm^2 area and 0.207cm^3 volume. There is Fat Layer (Subcutaneous Tissue) exposed. There is [name] small amount of serosanguineous drainage noted. The wound margin is distinct with the outline attached to the wound base. There is no granulation within the wound bed. There is [name] large (67-100%) amount of necrotic tissue within  the wound bed including Adherent Slough. Assessment Active Problems ICD-10 Type 2 diabetes mellitus with foot ulcer Non-pressure chronic ulcer of other part of left foot with necrosis of muscle Essential (primary) hypertension Chronic combined systolic (congestive) and diastolic (congestive) heart failure Plan Wound Cleansing: Wound #4 Left,Lateral Foot: Dial antibacterial soap, wash wounds, rinse and pat dry prior to dressing wounds Primary Wound Dressing: Wound #4 Left,Lateral Foot: Silver Alginate Secondary Dressing: Wound #4 Left,Lateral Foot: ABD and Kerlix/Conform Other - ace wrap to hold all in place Dressing Change Frequency: Wound #4 Left,Lateral Foot: Change dressing every other day. Follow-up Appointments: Wound #4 Left,Lateral Foot: Return Appointment in 2 weeks. 1. I would recommend currently that we go ahead and continue with the wound care measures with the silver alginate this seems to  be tucked into the wound location. 2. I am also can recommend the patient continue to monitor for any signs of worsening infection if anything happens she will contact the office let me know. 3. I would recommend as well the patient continue to offload in order to ensure that we do not have any further breakdown and allow this to continue to heal I think that is the best thing she can do at this point for herself to be honest. We will see patient back for reevaluation in 2 weeks here in the clinic. If anything worsens or changes patient will contact our office for additional recommendations. Electronic Signature(s) Signed: 05/11/2020 3:56:01 PM By: Worthy Keeler PA-C Entered By: Worthy Keeler on 05/11/2020 15:56:01 Kassebaum, [name] ([id number]) Shari Prows, [name] ([id number]) -------------------------------------------------------------------------------- SuperBill Details Patient Name: [name] Date of Service: 05/11/2020 Medical Record Number: [id number] Patient Account Number: [id number] Date of Birth/Sex: [birth year] (64 y.o. F) Treating RN: Grover Canavan Primary Care Provider: [name] Other Clinician: Referring Provider: [name] Treating Provider/Extender: Melburn Hake, Domonik Levario Weeks in Treatment: 1 Diagnosis Coding ICD-10 Codes Code Description E11.621 Type 2 diabetes mellitus with foot ulcer L97.523 Non-pressure chronic ulcer of other part of left foot with necrosis of muscle I10 Essential (primary) hypertension I50.42 Chronic combined systolic (congestive) and diastolic (congestive) heart failure Facility Procedures CPT4 Code: [id number] Description: 99213 - WOUND CARE VISIT-LEV 3 EST PT Modifier: Quantity: 1 Physician Procedures CPT4 Code: [id number] Description: 99213 - WC PHYS LEVEL 3 - EST PT Modifier: Quantity: 1 CPT4 Code: Description: ICD-10 Diagnosis Description E11.621 Type 2 diabetes mellitus with foot ulcer L97.523 Non-pressure chronic ulcer  of other part of left foot with necrosis of I10 Essential (primary) hypertension I50.42 Chronic combined systolic (congestive)  and diastolic (congestive) hear Modifier: muscle t failure Quantity: Electronic Signature(s) Signed: 05/11/2020 3:56:12 PM By: Worthy Keeler PA-C Entered By: Worthy Keeler on 05/11/2020 15:56:12

## 2020-05-14 DIAGNOSIS — I051 Rheumatic mitral insufficiency: Secondary | ICD-10-CM | POA: Diagnosis not present

## 2020-05-14 DIAGNOSIS — I509 Heart failure, unspecified: Secondary | ICD-10-CM | POA: Diagnosis not present

## 2020-05-14 DIAGNOSIS — E1122 Type 2 diabetes mellitus with diabetic chronic kidney disease: Secondary | ICD-10-CM | POA: Diagnosis not present

## 2020-05-14 DIAGNOSIS — N184 Chronic kidney disease, stage 4 (severe): Secondary | ICD-10-CM | POA: Diagnosis not present

## 2020-05-14 DIAGNOSIS — Z4781 Encounter for orthopedic aftercare following surgical amputation: Secondary | ICD-10-CM | POA: Diagnosis not present

## 2020-05-14 DIAGNOSIS — I130 Hypertensive heart and chronic kidney disease with heart failure and stage 1 through stage 4 chronic kidney disease, or unspecified chronic kidney disease: Secondary | ICD-10-CM | POA: Diagnosis not present

## 2020-05-14 DIAGNOSIS — E1169 Type 2 diabetes mellitus with other specified complication: Secondary | ICD-10-CM | POA: Diagnosis not present

## 2020-05-14 DIAGNOSIS — M869 Osteomyelitis, unspecified: Secondary | ICD-10-CM | POA: Diagnosis not present

## 2020-05-14 DIAGNOSIS — C50919 Malignant neoplasm of unspecified site of unspecified female breast: Secondary | ICD-10-CM | POA: Diagnosis not present

## 2020-05-15 DIAGNOSIS — N184 Chronic kidney disease, stage 4 (severe): Secondary | ICD-10-CM | POA: Diagnosis not present

## 2020-05-15 DIAGNOSIS — E1169 Type 2 diabetes mellitus with other specified complication: Secondary | ICD-10-CM | POA: Diagnosis not present

## 2020-05-15 DIAGNOSIS — E1122 Type 2 diabetes mellitus with diabetic chronic kidney disease: Secondary | ICD-10-CM | POA: Diagnosis not present

## 2020-05-15 DIAGNOSIS — I130 Hypertensive heart and chronic kidney disease with heart failure and stage 1 through stage 4 chronic kidney disease, or unspecified chronic kidney disease: Secondary | ICD-10-CM | POA: Diagnosis not present

## 2020-05-15 DIAGNOSIS — M869 Osteomyelitis, unspecified: Secondary | ICD-10-CM | POA: Diagnosis not present

## 2020-05-15 DIAGNOSIS — I051 Rheumatic mitral insufficiency: Secondary | ICD-10-CM | POA: Diagnosis not present

## 2020-05-15 DIAGNOSIS — Z4781 Encounter for orthopedic aftercare following surgical amputation: Secondary | ICD-10-CM | POA: Diagnosis not present

## 2020-05-15 DIAGNOSIS — I509 Heart failure, unspecified: Secondary | ICD-10-CM | POA: Diagnosis not present

## 2020-05-15 DIAGNOSIS — C50919 Malignant neoplasm of unspecified site of unspecified female breast: Secondary | ICD-10-CM | POA: Diagnosis not present

## 2020-05-17 DIAGNOSIS — I509 Heart failure, unspecified: Secondary | ICD-10-CM | POA: Diagnosis not present

## 2020-05-17 DIAGNOSIS — N184 Chronic kidney disease, stage 4 (severe): Secondary | ICD-10-CM | POA: Diagnosis not present

## 2020-05-17 DIAGNOSIS — I130 Hypertensive heart and chronic kidney disease with heart failure and stage 1 through stage 4 chronic kidney disease, or unspecified chronic kidney disease: Secondary | ICD-10-CM | POA: Diagnosis not present

## 2020-05-17 DIAGNOSIS — I051 Rheumatic mitral insufficiency: Secondary | ICD-10-CM | POA: Diagnosis not present

## 2020-05-17 DIAGNOSIS — E1122 Type 2 diabetes mellitus with diabetic chronic kidney disease: Secondary | ICD-10-CM | POA: Diagnosis not present

## 2020-05-17 DIAGNOSIS — E1169 Type 2 diabetes mellitus with other specified complication: Secondary | ICD-10-CM | POA: Diagnosis not present

## 2020-05-17 DIAGNOSIS — Z4781 Encounter for orthopedic aftercare following surgical amputation: Secondary | ICD-10-CM | POA: Diagnosis not present

## 2020-05-17 DIAGNOSIS — C50919 Malignant neoplasm of unspecified site of unspecified female breast: Secondary | ICD-10-CM | POA: Diagnosis not present

## 2020-05-17 DIAGNOSIS — M869 Osteomyelitis, unspecified: Secondary | ICD-10-CM | POA: Diagnosis not present

## 2020-05-21 ENCOUNTER — Inpatient Hospital Stay: Payer: Medicare HMO

## 2020-05-21 ENCOUNTER — Other Ambulatory Visit: Payer: Self-pay | Admitting: *Deleted

## 2020-05-21 ENCOUNTER — Other Ambulatory Visit: Payer: Self-pay

## 2020-05-21 ENCOUNTER — Inpatient Hospital Stay (HOSPITAL_BASED_OUTPATIENT_CLINIC_OR_DEPARTMENT_OTHER): Payer: Medicare HMO | Admitting: Internal Medicine

## 2020-05-21 VITALS — BP 114/66 | HR 68 | Temp 96.5°F | Resp 18 | Ht 62.0 in | Wt 197.0 lb

## 2020-05-21 DIAGNOSIS — M869 Osteomyelitis, unspecified: Secondary | ICD-10-CM | POA: Diagnosis not present

## 2020-05-21 DIAGNOSIS — K859 Acute pancreatitis without necrosis or infection, unspecified: Secondary | ICD-10-CM | POA: Diagnosis not present

## 2020-05-21 DIAGNOSIS — D649 Anemia, unspecified: Secondary | ICD-10-CM

## 2020-05-21 DIAGNOSIS — B370 Candidal stomatitis: Secondary | ICD-10-CM | POA: Diagnosis not present

## 2020-05-21 DIAGNOSIS — Z170 Estrogen receptor positive status [ER+]: Secondary | ICD-10-CM

## 2020-05-21 DIAGNOSIS — C50212 Malignant neoplasm of upper-inner quadrant of left female breast: Secondary | ICD-10-CM

## 2020-05-21 DIAGNOSIS — N184 Chronic kidney disease, stage 4 (severe): Secondary | ICD-10-CM | POA: Diagnosis not present

## 2020-05-21 DIAGNOSIS — D72829 Elevated white blood cell count, unspecified: Secondary | ICD-10-CM | POA: Diagnosis not present

## 2020-05-21 DIAGNOSIS — C7951 Secondary malignant neoplasm of bone: Secondary | ICD-10-CM | POA: Diagnosis not present

## 2020-05-21 DIAGNOSIS — Z5111 Encounter for antineoplastic chemotherapy: Secondary | ICD-10-CM | POA: Diagnosis not present

## 2020-05-21 LAB — COMPREHENSIVE METABOLIC PANEL
ALT: 13 U/L (ref 0–44)
AST: 19 U/L (ref 15–41)
Albumin: 2.6 g/dL — ABNORMAL LOW (ref 3.5–5.0)
Alkaline Phosphatase: 55 U/L (ref 38–126)
Anion gap: 6 (ref 5–15)
BUN: 71 mg/dL — ABNORMAL HIGH (ref 8–23)
CO2: 25 mmol/L (ref 22–32)
Calcium: 8.6 mg/dL — ABNORMAL LOW (ref 8.9–10.3)
Chloride: 104 mmol/L (ref 98–111)
Creatinine, Ser: 3.35 mg/dL — ABNORMAL HIGH (ref 0.44–1.00)
GFR, Estimated: 14 mL/min — ABNORMAL LOW (ref >60)
Glucose, Bld: 129 mg/dL — ABNORMAL HIGH (ref 70–99)
Potassium: 5.2 mmol/L — ABNORMAL HIGH (ref 3.5–5.1)
Sodium: 135 mmol/L (ref 135–145)
Total Bilirubin: 0.6 mg/dL (ref 0.3–1.2)
Total Protein: 6.8 g/dL (ref 6.5–8.1)

## 2020-05-21 LAB — CBC WITH DIFFERENTIAL/PLATELET
Abs Immature Granulocytes: 0.92 10*3/uL — ABNORMAL HIGH (ref 0.00–0.07)
Basophils Absolute: 0.1 10*3/uL (ref 0.0–0.1)
Basophils Relative: 1 %
Eosinophils Absolute: 0.1 10*3/uL (ref 0.0–0.5)
Eosinophils Relative: 1 %
HCT: 22 % — ABNORMAL LOW (ref 36.0–46.0)
Hemoglobin: 7.5 g/dL — ABNORMAL LOW (ref 12.0–15.0)
Immature Granulocytes: 18 %
Lymphocytes Relative: 11 %
Lymphs Abs: 0.6 10*3/uL — ABNORMAL LOW (ref 0.7–4.0)
MCH: 30.1 pg (ref 26.0–34.0)
MCHC: 34.1 g/dL (ref 30.0–36.0)
MCV: 88.4 fL (ref 80.0–100.0)
Monocytes Absolute: 1.1 10*3/uL — ABNORMAL HIGH (ref 0.1–1.0)
Monocytes Relative: 22 %
Neutro Abs: 2.4 10*3/uL (ref 1.7–7.7)
Neutrophils Relative %: 47 %
Platelets: 497 10*3/uL — ABNORMAL HIGH (ref 150–400)
RBC: 2.49 MIL/uL — ABNORMAL LOW (ref 3.87–5.11)
RDW: 14 % (ref 11.5–15.5)
Smear Review: NORMAL
WBC: 5.1 10*3/uL (ref 4.0–10.5)
nRBC: 3.3 % — ABNORMAL HIGH (ref 0.0–0.2)

## 2020-05-21 LAB — ABO/RH: ABO/RH(D): A POS

## 2020-05-21 LAB — PREPARE RBC (CROSSMATCH)

## 2020-05-21 MED ORDER — ACETAMINOPHEN 325 MG PO TABS
650.0000 mg | ORAL_TABLET | Freq: Once | ORAL | Status: AC
  Administered 2020-05-21: 650 mg via ORAL
  Filled 2020-05-21: qty 2

## 2020-05-21 MED ORDER — HEPARIN SOD (PORK) LOCK FLUSH 100 UNIT/ML IV SOLN
500.0000 [IU] | Freq: Once | INTRAVENOUS | Status: AC
  Administered 2020-05-21: 500 [IU] via INTRAVENOUS
  Filled 2020-05-21: qty 5

## 2020-05-21 MED ORDER — SODIUM CHLORIDE 0.9% FLUSH
10.0000 mL | INTRAVENOUS | Status: DC | PRN
  Administered 2020-05-21: 10 mL via INTRAVENOUS
  Filled 2020-05-21: qty 10

## 2020-05-21 MED ORDER — HEPARIN SOD (PORK) LOCK FLUSH 100 UNIT/ML IV SOLN
INTRAVENOUS | Status: AC
  Filled 2020-05-21: qty 5

## 2020-05-21 MED ORDER — DIPHENHYDRAMINE HCL 25 MG PO CAPS
25.0000 mg | ORAL_CAPSULE | Freq: Once | ORAL | Status: AC
  Administered 2020-05-21: 25 mg via ORAL
  Filled 2020-05-21: qty 1

## 2020-05-21 MED ORDER — SODIUM CHLORIDE 0.9% IV SOLUTION
250.0000 mL | Freq: Once | INTRAVENOUS | Status: AC
  Administered 2020-05-21: 250 mL via INTRAVENOUS
  Filled 2020-05-21: qty 250

## 2020-05-21 NOTE — Progress Notes (Signed)
Recently diagnosed with strep and currently taking antibiotics for that.

## 2020-05-21 NOTE — Progress Notes (Signed)
Patient had 1 unit transfused at the [facility], no reaction suspected. Patient/vitals stable. Patient educated. D'C'd with family member. Patient states "she feels better already".

## 2020-05-21 NOTE — Progress Notes (Signed)
[facility] OFFICE PROGRESS NOTE  Patient Care Team: [name], [name], MD as Medical Oncologist (Medical Oncology) Corey Skains, MD as Consulting Physician (Cardiology)  Cancer Staging No matching staging information was found for the patient.   Oncology History Overview Note  # OCT 2015-STAGE IV LEFT BREAST T2N1 [T=4cm; [name]; [name]; [name]; EBUS- Positive Paratrac/subcarinal LN s/p ? Taxotere [in [place]; [name]; [name];[name]; [name]; suv 3]; [name]; [name]; [name];  [name]; Ant R iliac bone; [name]; [name]; START-Taxol weekly. March 2020- Taxol every 2 weeks [PN]; SEP 2020- PET progression  # SEP 04/29/2019- ERIBULIN s/p RT - Right hip- [s/p RT- NOV 2020]; AUG 2021-PET scan progressive disease ; rising tumor markers.  Discontinued Eribulin- AUG 2021 [left foot osteomyelitis s/p toe amputation; Dr. Antonietta Barcelona. [place]];  # SEP [id number]- GEMCIATBINE [820m/m2]  # Poorly controlled Blood sugars- improved.   # Pancreatitis Hx/PEI- on creon in past / CKD IV [creat ~ 3-4; [name]; [name]; mild left sided weakness]  # Jan 2020-  Lobular lesion on tongue- s/p excision pyogenic granuloma [Dr.McQueen]   # GENETIC TESTING/COUNSELLING: HETEROZYGOUS Cystic Fibrosis Gene [explains hx of recurrent pancreatitis]  # MOLECULAR TESTING: NA   # PALLIATIVE CARE:  1/22-Discussed/Declined ------------------------------------------------   DIAGNOSIS: [ 264[address]]BREAST CA; [name]; [name] ;GOALS: Palliative  CURRENT/MOST RECENT THERAPY: GEMCITABINE[C].     Carcinoma of upper-inner quadrant of left breast in female, estrogen receptor positive ([place]  04/29/2019 - 02/24/2020 Chemotherapy   The patient had eriBULin mesylate (HALAVEN) 2 mg in sodium chloride 0.9 % 100 mL chemo infusion, 2 mg, Intravenous,  Once, 12 of 14 cycles Dose modification: 1 mg/m2 (original dose 1 mg/m2, Cycle 1, Reason: Provider Judgment) Administration: 2 mg (06/03/2019), 2 mg (04/29/2019), 2 mg (06/10/2019), 2 mg (07/04/2019), 2 mg (07/11/2019), 2 mg (07/25/2019), 2 mg (08/03/2019), 2 mg (08/19/2019), 2 mg (08/26/2019), 2 mg (09/09/2019), 2 mg (09/16/2019), 2 mg (10/07/2019), 2 mg (10/14/2019), 2 mg (10/28/2019), 2 mg (11/04/2019), 2 mg (11/28/2019), 2 mg (12/09/2019), 2 mg (01/13/2020), 2 mg (01/20/2020), 2 mg (02/02/2020), 2 mg (02/13/2020), 2 mg (02/24/2020)  for chemotherapy treatment.    04/30/2020 -  Chemotherapy   The patient had gemcitabine (GEMZAR) 1,600 mg in sodium chloride 0.9 % 250 mL chemo infusion, 1,596 mg, Intravenous,  Once, 1 of 5 cycles Administration: 1,600 mg (04/30/2020), 1,600 mg (05/07/2020)  for chemotherapy treatment.     INTERVAL HISTORY:  [name] 64y.o.  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  Patient is currently s/p cycle #1 of gemcitabine approximately 2 weeks ago.   Patient noted to have worsening fatigue.  Also complains of worsening shortness of breath on exertion.  Patient had episode of nausea with vomiting s/p antiemetics.  No diarrhea.  No skin rash.  In the interim patient was evaluated at urgent care clinic for difficulty swallowing-diagnosed  with thrush.  Patient is currently on nystatin.  Patient has been released by  podiatry/ID-with regards to her wound healing.  She is currently off antibiotics.  Review of Systems  Constitutional: Positive for malaise/fatigue. Negative for chills, diaphoresis, [name], hemoptysis, sputum production, [name], palpitations and orthopnea.  Gastrointestinal: Negative for abdominal pain, blood in stool, constipation, diarrhea, heartburn, melena, nausea and vomiting.  Genitourinary: Negative for dysuria, frequency and urgency.  Musculoskeletal: Positive for back pain and joint pain.  Skin: Negative.  Negative for itching and rash.  Neurological: Positive for tingling. Negative for dizziness, focal weakness, weakness and headaches.  Endo/Heme/Allergies: Does not bruise/bleed easily.  Psychiatric/Behavioral: Negative for depression. The patient is not nervous/anxious and does not have insomnia.     PAST MEDICAL HISTORY :  Past Medical History:  Diagnosis Date  . Anemia   . Anxiety   . Asthma   . Cancer ([place]) 03/10/2018   Per [facility] PET order. Carcinoma of upper-inner quadrant of left breast in female, estrogen receptor positive .  Marland Kitchen Cancer (HCC)    LUNG  . CHF (congestive heart failure) ([place]) 1997  . CKD (chronic kidney disease)   . Depression   . Diabetes mellitus, type 2 ([place])   . Family history of breast cancer   . Family history of colon cancer   . Family history of ovarian cancer   . Family history of pancreatic cancer   . Family history of prostate cancer   . Family history of stomach cancer   . GERD (gastroesophageal reflux disease)    history of an ulcer  . Hair loss   . History of left breast cancer 05/29/14  . History of partial hysterectomy 12/31/2016   Per patient.  Has not had a period in years.  Had a partial hysterectomy years ago.  Marland Kitchen Hypertension   .  Mitral valve regurgitation   . Neuromuscular disorder (HCC)    neuropathies in hand  . Obesity   . Pancreatitis 1997  . Stroke [facility]) 2010   with mild left arm weakness    PAST SURGICAL HISTORY :   Past Surgical History:  Procedure Laterality Date  . AMPUTATION Left 03/30/2020   Procedure: AMPUTATION 5th RAY;  Surgeon: Samara Deist, DPM;  Location: [facility] ORS;  Service: Podiatry;  Laterality: Left;  . APPLICATION OF WOUND VAC Left 03/30/2020   Procedure: APPLICATION OF WOUND VAC;  Surgeon: Samara Deist, DPM;  Location: [facility] OR[facility];  Service: Podiatry;  Laterality: Left;  . CATARACT EXTRACTION W/PHACO Right 02/24/2019   Procedure: CATARACT EXTRACTION PHACO AND INTRAOCULAR LENS PLACEMENT ([place]) RIGHT DIABETES;  Surgeon: Marchia Meiers, MD;  Location: [facility] OR[facility];  Service: Ophthalmology;  Laterality: Right;  [place] 01:13.0 CDE 7.96 Fluid Pack Lot # [id number]  . CATARACT EXTRACTION W/PHACO Left 03/24/2019   Procedure: CATARACT EXTRACTION PHACO AND INTRAOCULAR LENS PLACEMENT ([facility]) - left diabetic;  Surgeon: Marchia Meiers, MD;  Location: [facility] OR[facility];  Service: Ophthalmology;  Laterality: Left;  [place]  01:36 CDE 13.93 Fluid pack lot # [id number]  . CENTRAL LINE INSERTION-TUNNELED N/A 04/04/2020   Procedure: CENTRAL LINE INSERTION-TUNNELED;  Surgeon: Katha Cabal, MD;  Location: [facility];  Service: Cardiovascular;  Laterality: N/A;  . CESAREAN SECTION    . CHOLECYSTECTOMY    . DIALYSIS/PERMA CATHETER REMOVAL N/A 05/01/2020  Procedure: DIALYSIS/PERMA CATHETER REMOVAL;  Surgeon: Katha Cabal, MD;  Location: [facility];  Service: Cardiovascular;  Laterality: N/A;  . EXCISION OF TONGUE LESION N/A 08/17/2018   Procedure: EXCISION OF TONGUE LESION WITH FROZEN SECTIONS;  Surgeon: Beverly Gust, MD;  Location: [facility] OR[facility];  Service: ENT;  Laterality: N/A;  . EYE SURGERY Right    cataract extraction  . IRRIGATION AND DEBRIDEMENT FOOT Left 03/30/2020   Procedure: IRRIGATION AND DEBRIDEMENT  FOOT;  Surgeon: Samara Deist, DPM;  Location: [facility] OR[facility];  Service: Podiatry;  Laterality: Left;  . PARTIAL HYSTERECTOMY  12/31/2016   Per patient, she has not had a period in years since she had a partial hysterectomy.  Marland Kitchen PORTA CATH INSERTION    . TUBAL LIGATION      FAMILY HISTORY :   Family History  Problem Relation Age of Onset  . Ovarian cancer Mother 33  . Diabetes Mother   . Hypertension Mother   . COPD Father   . Hypertension Father   . Colon cancer Father 64  . Diabetes Sister   . Breast cancer Sister 81       bilateral  . Diabetes Brother   . Leukemia Maternal Aunt   . Pancreatic cancer Paternal Aunt 43  . Pancreatic cancer Paternal Uncle   . Colon cancer Paternal Uncle   . Stomach cancer Maternal Grandfather 39  . Throat cancer Paternal Grandmother   . Breast cancer Maternal Aunt 80  . Colon cancer Maternal Aunt   . Bone cancer Maternal Aunt   . Breast cancer Paternal Aunt        dx >50  . Prostate cancer Paternal Uncle   . Pancreatic cancer Paternal Uncle   . Throat cancer Paternal Uncle   . Lung cancer Paternal Uncle   . Stomach cancer Paternal Uncle   . Brain cancer Paternal Aunt   . Cancer Cousin        liver, kidney  . Prostate cancer Cousin        meastatic  . Lung cancer Other     SOCIAL HISTORY:   Social History   Tobacco Use  . Smoking status: Former Smoker    Packs/day: 0.50    Years: 1.00    Pack years: 0.50    Types: Cigarettes  . Smokeless tobacco: Never Used  Vaping Use  . Vaping Use: Never used  Substance Use Topics  . Alcohol use: No    Alcohol/week: 0.0 standard drinks  . Drug use: No    ALLERGIES:  is allergic to fish-derived products, chlorhexidine, and sulfamethoxazole-trimethoprim.  MEDICATIONS:  Current Outpatient Medications  Medication Sig Dispense Refill  . acetaminophen-codeine (TYLENOL #4) 300-60 MG tablet Take 1 tablet by mouth in the morning and at bedtime.     Marland Kitchen albuterol (PROAIR HFA) 108 (90 BASE) MCG/ACT  inhaler Inhale 2 puffs into the lungs every 6 (six) hours as needed for wheezing or shortness of breath.     . ALPRAZolam (XANAX) 0.5 MG tablet Take 0.5 mg by mouth 2 (two) times daily as needed for anxiety or sleep.     Marland Kitchen amLODipine (NORVASC) 10 MG tablet Take 10 mg by mouth daily.     Marland Kitchen aspirin EC 81 MG tablet Take 81 mg by mouth daily.     Marland Kitchen atenolol (TENORMIN) 100 MG tablet Take 100 mg by mouth 2 (two) times daily.     . bumetanide (BUMEX) 0.5 MG tablet Take 0.5 mg by mouth 2 (two) times  daily.     . calcitRIOL (ROCALTROL) 0.25 MCG capsule Take 0.25 mcg by mouth daily.     . Cinnamon 500 MG capsule Take 500 mg by mouth 2 (two) times daily.     . cloNIDine (CATAPRES) 0.2 MG tablet Take 0.2 mg by mouth 2 (two) times daily.     Marland Kitchen docusate sodium (COLACE) 100 MG capsule Take 1 capsule (100 mg total) by mouth 2 (two) times daily. (Patient taking differently: Take 100 mg by mouth daily. ) 10 capsule 0  . enalapril (VASOTEC) 10 MG tablet Take 20 mg by mouth 2 (two) times a day.     . famotidine (PEPCID) 20 MG tablet Take 20 mg by mouth 2 (two) times daily.     . ferrous sulfate 325 (65 FE) MG tablet Take 325 mg by mouth daily with breakfast.    . FLUoxetine (PROZAC) 20 MG capsule Take 20 mg by mouth 2 (two) times daily.     . Fluticasone Propionate, Inhal, (FLOVENT DISKUS) 100 MCG/BLIST AEPB Inhale 2 puffs into the lungs in the morning and at bedtime.    . gabapentin (NEURONTIN) 100 MG capsule TAKE 1 CAPSULE(100 MG) BY MOUTH AT BEDTIME (Patient taking differently: Take 100 mg by mouth at bedtime. ) 30 capsule 6  . glyBURIDE (DIABETA) 5 MG tablet Take 10 mg by mouth 2 (two) times daily with a meal.     . LEVEMIR FLEXTOUCH 100 UNIT/ML FlexPen Inject 25 Units into the skin daily. 15 mL 11  . NOVOLOG FLEXPEN 100 UNIT/ML FlexPen Inject 5 Units into the skin 3 (three) times daily with meals. (Patient taking differently: Inject 7 Units into the skin 2 (two) times daily with a meal. ) 15 mL 11  . nystatin  (MYCOSTATIN) 100000 UNIT/ML suspension Swish and swallow 5 mLs 4 (four) times daily for 10 days    . oxyCODONE-acetaminophen (PERCOCET/ROXICET) 5-325 MG tablet Take by mouth.    . senna (SENOKOT) 8.6 MG TABS tablet Take 1 tablet (8.6 mg total) by mouth 2 (two) times daily as needed for mild constipation or moderate constipation. 120 tablet 0  . simvastatin (ZOCOR) 20 MG tablet Take 20 mg by mouth every evening.     . folic acid (FOLVITE) 1 MG tablet Take 1 tablet (1 mg total) by mouth daily. (Patient not taking: Reported on 05/21/2020)     No current facility-administered medications for this visit.   Facility-Administered Medications Ordered in Other Visits  Medication Dose Route Frequency Provider Last Rate Last Admin  . heparin lock flush 100 unit/mL  500 Units Intravenous Once Charlaine Dalton R, MD      . sodium chloride flush (NS) 0.9 % injection 10 mL  10 mL Intravenous PRN Cammie Sickle, MD   10 mL at 01/30/16 1054  . sodium chloride flush (NS) 0.9 % injection 10 mL  10 mL Intravenous PRN Cammie Sickle, MD   10 mL at 05/21/20 0841    PHYSICAL EXAMINATION: ECOG PERFORMANCE STATUS: 1 - Symptomatic but completely ambulatory  BP 114/66 (BP Location: Right Arm, Patient Position: Sitting, Cuff Size: Large)   Pulse 68   Temp (!) 96.5 F (35.8 C) (Tympanic)   Resp 18   Ht 5' 2"  (1.575 m)   Wt 197 lb (89.4 kg)   SpO2 100%   BMI 36.03 kg/m   Filed Weights   05/21/20 0848  Weight: 197 lb (89.4 kg)    Physical Exam Constitutional:      Comments: She  is alone.  HENT:     Head: Normocephalic and atraumatic.     Mouth/Throat:     Pharynx: No oropharyngeal exudate.  Eyes:     Pupils: Pupils are equal, round, and reactive to light.  Cardiovascular:     Rate and Rhythm: Normal rate and regular rhythm.  Pulmonary:     Effort: No respiratory distress.     Breath sounds: Normal breath sounds. No wheezing.  Abdominal:     General: Bowel sounds are normal. There  is no distension.     Palpations: Abdomen is soft. There is no mass.     Tenderness: There is no abdominal tenderness. There is no guarding or rebound.  Musculoskeletal:        General: No tenderness. Normal range of motion.     Cervical back: Normal range of motion and neck supple.  Skin:    General: Skin is warm.     Comments: Left sole-ulceration noted/see picture below.  Neurological:     Mental Status: She is alert and oriented to person, place, and time.  Psychiatric:        Mood and Affect: Affect normal.     LABORATORY DATA:  I have reviewed the data as listed    Component Value Date/Time   NA 135 05/21/2020 0841   NA 130 (L) 06/06/2014 1102   K 5.2 (H) 05/21/2020 0841   K 3.9 06/06/2014 1102   CL 104 05/21/2020 0841   CL 95 (L) 06/06/2014 1102   CO2 25 05/21/2020 0841   CO2 28 06/06/2014 1102   GLUCOSE 129 (H) 05/21/2020 0841   GLUCOSE 349 (H) 06/06/2014 1102   BUN 71 (H) 05/21/2020 0841   BUN 17 06/06/2014 1102   CREATININE 3.35 (H) 05/21/2020 0841   CREATININE 1.63 (H) 06/06/2014 1102   CALCIUM 8.6 (L) 05/21/2020 0841   CALCIUM 9.2 06/06/2014 1102   PROT 6.8 05/21/2020 0841   PROT 8.2 06/06/2014 1102   ALBUMIN 2.6 (L) 05/21/2020 0841   ALBUMIN 3.3 (L) 06/06/2014 1102   AST 19 05/21/2020 0841   AST 7 (L) 06/06/2014 1102   ALT 13 05/21/2020 0841   ALT 12 (L) 06/06/2014 1102   ALKPHOS 55 05/21/2020 0841   ALKPHOS 74 06/06/2014 1102   BILITOT 0.6 05/21/2020 0841   BILITOT 0.4 06/06/2014 1102   GFRNONAA 14 (L) 05/21/2020 0841   GFRNONAA 35 (L) 06/06/2014 1102   GFRAA 24 (L) 05/07/2020 0807   GFRAA 42 (L) 06/06/2014 1102    No results found for: SPEP, UPEP  Lab Results  Component Value Date   WBC 5.1 05/21/2020   NEUTROABS 2.4 05/21/2020   HGB 7.5 (L) 05/21/2020   HCT 22.0 (L) 05/21/2020   MCV 88.4 05/21/2020   PLT 497 (H) 05/21/2020      Chemistry      Component Value Date/Time   NA 135 05/21/2020 0841   NA 130 (L) 06/06/2014 1102   K 5.2  (H) 05/21/2020 0841   K 3.9 06/06/2014 1102   CL 104 05/21/2020 0841   CL 95 (L) 06/06/2014 1102   CO2 25 05/21/2020 0841   CO2 28 06/06/2014 1102   BUN 71 (H) 05/21/2020 0841   BUN 17 06/06/2014 1102   CREATININE 3.35 (H) 05/21/2020 0841   CREATININE 1.63 (H) 06/06/2014 1102      Component Value Date/Time   CALCIUM 8.6 (L) 05/21/2020 0841   CALCIUM 9.2 06/06/2014 1102   ALKPHOS 55 05/21/2020 0841   ALKPHOS  74 06/06/2014 1102   AST 19 05/21/2020 0841   AST 7 (L) 06/06/2014 1102   ALT 13 05/21/2020 0841   ALT 12 (L) 06/06/2014 1102   BILITOT 0.6 05/21/2020 0841   BILITOT 0.4 06/06/2014 1102         RADIOGRAPHIC STUDIES: I have personally reviewed the radiological images as listed and agreed with the findings in the report. No results found.   ASSESSMENT & PLAN:  Carcinoma of upper-inner quadrant of left breast in female, estrogen receptor positive ([place]) Left breast cancer- stage IV- ER/PR +, HER-2/neu.  PET AUG 2021-shows progressive lung lesion/pleural-based; bone lesions; consistent with rising tumor marker.  Currently on gemcitabine chemotherapy.  # HOLD cycle #2 of gemcitabine day 1. Labs today reviewed; NOT  acceptable for treatment today- anemia Hb 7.5; GFR-14 [baseline 24]; K-5.2  [see below].  Given reasons below discussed with patient that we recommend holding chemotherapy today.  We will reassess at next visit.  # Anemia-hemoglobin 7.5 symptomatic anemia.-Multifactorial chronic kidney disease/chemotherapy. On PO iron; Sep 2021- Iron sat-30% Plan PRBC transfusion.  If worse would recommend-Retacrit.  # Chronic kidney disease - stage IV [GFR-12]- worse; K-5.2;- [[name]; # HOLD ENALAPRIL because of slightly elevated potassium.  # Oral thrush-new- currently on s/p nystatin. STABLE.   # Bone mets-sclerotic; Right acetabular uptake-s/p radiation. Hypocalcemia-ca-8.3/low vitD- on vit D daily. STABLE.   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 S/P oral cipro and doxycycline s/p evaluation Dr.Fitzgerald/Dr.Folwer. STABLE.   # DISPOSITION:  # HOLD Gem chemo today # HOLD tube today; PRBC today # 1 week-MD; labs-cbc/bmp;Gemcitabine; HOLD tube/possible PRBC  #  Follow up in 2 week; MD; labs- cbc/cmp;ca-27-27;cea; ca-15-3-;Gemcitabine; HOLD tube/possible PRBC - Dr.B   Orders Placed This Encounter  Procedures  . Informed Consent Details: Physician/Practitioner Attestation; Transcribe to consent form and obtain patient signature    Standing Status:   Future    Number of Occurrences:   1    Standing Expiration Date:   05/21/2021    Order Specific Question:   Physician/Practitioner attestation of informed consent for blood and or blood product transfusion    Answer:   I, the physician/practitioner, attest that I have discussed with the patient the benefits, risks, side effects, alternatives, likelihood of achieving goals and potential problems during recovery for the procedure that I have provided informed consent.    Order Specific Question:   Product(s)    Answer:   All Product(s)  . Type and screen         Standing Status:   Future    Number of Occurrences:   1    Standing Expiration Date:   05/21/2021   All questions were answered. The patient knows to call the clinic with any problems, questions or concerns.      Cammie Sickle, MD 05/21/2020 1:21 PM

## 2020-05-21 NOTE — Patient Instructions (Signed)
#   HOLD ENALAPRIL because of slightly high Potassium.

## 2020-05-21 NOTE — Assessment & Plan Note (Addendum)
Left breast cancer- stage IV- ER/PR +, HER-2/neu.  PET AUG 2021-shows progressive lung lesion/pleural-based; bone lesions; consistent with rising tumor marker.  Currently on gemcitabine chemotherapy.  # HOLD cycle #2 of gemcitabine day 1. Labs today reviewed; NOT  acceptable for treatment today- anemia Hb 7.5; GFR-14 [baseline 24]; K-5.2  [see below].  Given reasons below discussed with patient that we recommend holding chemotherapy today.  We will reassess at next visit.  # Anemia-hemoglobin 7.5 symptomatic anemia.-Multifactorial chronic kidney disease/chemotherapy. On PO iron; Sep 2021- Iron sat-30% Plan PRBC transfusion.  If worse would recommend-Retacrit.  # Chronic kidney disease - stage IV [GFR-12]- worse; K-5.2;- [[name]; # HOLD ENALAPRIL because of slightly elevated potassium.  # Oral thrush-new- currently on s/p nystatin. STABLE.   # Bone mets-sclerotic; Right acetabular uptake-s/p radiation. Hypocalcemia-ca-8.3/low vitD- on vit D daily. STABLE.   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 S/P oral cipro and doxycycline s/p evaluation Dr.Fitzgerald/Dr.Folwer. STABLE.   # DISPOSITION:  # HOLD Gem chemo today # HOLD tube today; PRBC today # 1 week-MD; labs-cbc/bmp;Gemcitabine; HOLD tube/possible PRBC  #  Follow up in 2 week; MD; labs- cbc/cmp;ca-27-27;cea; ca-15-3-;Gemcitabine; HOLD tube/possible PRBC - Dr.B

## 2020-05-22 ENCOUNTER — Inpatient Hospital Stay: Payer: Medicare HMO

## 2020-05-22 LAB — TYPE AND SCREEN
ABO/RH(D): A POS
Antibody Screen: NEGATIVE
Unit division: 0
Unit division: 0

## 2020-05-22 LAB — BPAM RBC
Blood Product Expiration Date: 202110182359
Blood Product Expiration Date: 202111182359
ISSUE DATE / TIME: 202110181218
Unit Type and Rh: 6200
Unit Type and Rh: 9500

## 2020-05-22 LAB — CANCER ANTIGEN 27.29: CA 27.29: 227 U/mL — ABNORMAL HIGH (ref 0.0–38.6)

## 2020-05-23 DIAGNOSIS — N2581 Secondary hyperparathyroidism of renal origin: Secondary | ICD-10-CM | POA: Diagnosis not present

## 2020-05-23 DIAGNOSIS — I120 Hypertensive chronic kidney disease with stage 5 chronic kidney disease or end stage renal disease: Secondary | ICD-10-CM | POA: Diagnosis not present

## 2020-05-23 DIAGNOSIS — R809 Proteinuria, unspecified: Secondary | ICD-10-CM | POA: Diagnosis not present

## 2020-05-23 DIAGNOSIS — D631 Anemia in chronic kidney disease: Secondary | ICD-10-CM | POA: Diagnosis not present

## 2020-05-23 DIAGNOSIS — E1122 Type 2 diabetes mellitus with diabetic chronic kidney disease: Secondary | ICD-10-CM | POA: Diagnosis not present

## 2020-05-23 DIAGNOSIS — N185 Chronic kidney disease, stage 5: Secondary | ICD-10-CM | POA: Diagnosis not present

## 2020-05-24 DIAGNOSIS — R531 Weakness: Secondary | ICD-10-CM | POA: Diagnosis not present

## 2020-05-24 DIAGNOSIS — E1122 Type 2 diabetes mellitus with diabetic chronic kidney disease: Secondary | ICD-10-CM | POA: Diagnosis not present

## 2020-05-24 DIAGNOSIS — I10 Essential (primary) hypertension: Secondary | ICD-10-CM | POA: Diagnosis not present

## 2020-05-24 DIAGNOSIS — J4520 Mild intermittent asthma, uncomplicated: Secondary | ICD-10-CM | POA: Diagnosis not present

## 2020-05-24 DIAGNOSIS — Z794 Long term (current) use of insulin: Secondary | ICD-10-CM | POA: Diagnosis not present

## 2020-05-24 DIAGNOSIS — C50412 Malignant neoplasm of upper-outer quadrant of left female breast: Secondary | ICD-10-CM | POA: Diagnosis not present

## 2020-05-24 DIAGNOSIS — N184 Chronic kidney disease, stage 4 (severe): Secondary | ICD-10-CM | POA: Diagnosis not present

## 2020-05-24 DIAGNOSIS — N185 Chronic kidney disease, stage 5: Secondary | ICD-10-CM | POA: Diagnosis not present

## 2020-05-24 DIAGNOSIS — Z23 Encounter for immunization: Secondary | ICD-10-CM | POA: Diagnosis not present

## 2020-05-25 DIAGNOSIS — Z4781 Encounter for orthopedic aftercare following surgical amputation: Secondary | ICD-10-CM | POA: Diagnosis not present

## 2020-05-25 DIAGNOSIS — C50919 Malignant neoplasm of unspecified site of unspecified female breast: Secondary | ICD-10-CM | POA: Diagnosis not present

## 2020-05-25 DIAGNOSIS — E1169 Type 2 diabetes mellitus with other specified complication: Secondary | ICD-10-CM | POA: Diagnosis not present

## 2020-05-25 DIAGNOSIS — M869 Osteomyelitis, unspecified: Secondary | ICD-10-CM | POA: Diagnosis not present

## 2020-05-25 DIAGNOSIS — I509 Heart failure, unspecified: Secondary | ICD-10-CM | POA: Diagnosis not present

## 2020-05-25 DIAGNOSIS — N184 Chronic kidney disease, stage 4 (severe): Secondary | ICD-10-CM | POA: Diagnosis not present

## 2020-05-25 DIAGNOSIS — I051 Rheumatic mitral insufficiency: Secondary | ICD-10-CM | POA: Diagnosis not present

## 2020-05-25 DIAGNOSIS — E1122 Type 2 diabetes mellitus with diabetic chronic kidney disease: Secondary | ICD-10-CM | POA: Diagnosis not present

## 2020-05-25 DIAGNOSIS — I130 Hypertensive heart and chronic kidney disease with heart failure and stage 1 through stage 4 chronic kidney disease, or unspecified chronic kidney disease: Secondary | ICD-10-CM | POA: Diagnosis not present

## 2020-05-28 ENCOUNTER — Inpatient Hospital Stay: Payer: Medicare HMO

## 2020-05-28 ENCOUNTER — Other Ambulatory Visit: Payer: Self-pay

## 2020-05-28 ENCOUNTER — Encounter: Payer: Self-pay | Admitting: Internal Medicine

## 2020-05-28 ENCOUNTER — Inpatient Hospital Stay (HOSPITAL_BASED_OUTPATIENT_CLINIC_OR_DEPARTMENT_OTHER): Payer: Medicare HMO | Admitting: Internal Medicine

## 2020-05-28 VITALS — BP 122/82 | HR 73 | Temp 96.0°F | Resp 18 | Wt 191.0 lb

## 2020-05-28 DIAGNOSIS — Z170 Estrogen receptor positive status [ER+]: Secondary | ICD-10-CM

## 2020-05-28 DIAGNOSIS — C7951 Secondary malignant neoplasm of bone: Secondary | ICD-10-CM | POA: Diagnosis not present

## 2020-05-28 DIAGNOSIS — C50212 Malignant neoplasm of upper-inner quadrant of left female breast: Secondary | ICD-10-CM | POA: Diagnosis not present

## 2020-05-28 DIAGNOSIS — Z7189 Other specified counseling: Secondary | ICD-10-CM

## 2020-05-28 DIAGNOSIS — N184 Chronic kidney disease, stage 4 (severe): Secondary | ICD-10-CM | POA: Diagnosis not present

## 2020-05-28 DIAGNOSIS — M869 Osteomyelitis, unspecified: Secondary | ICD-10-CM | POA: Diagnosis not present

## 2020-05-28 DIAGNOSIS — B370 Candidal stomatitis: Secondary | ICD-10-CM | POA: Diagnosis not present

## 2020-05-28 DIAGNOSIS — D72829 Elevated white blood cell count, unspecified: Secondary | ICD-10-CM | POA: Diagnosis not present

## 2020-05-28 DIAGNOSIS — K859 Acute pancreatitis without necrosis or infection, unspecified: Secondary | ICD-10-CM | POA: Diagnosis not present

## 2020-05-28 DIAGNOSIS — D649 Anemia, unspecified: Secondary | ICD-10-CM

## 2020-05-28 DIAGNOSIS — Z5111 Encounter for antineoplastic chemotherapy: Secondary | ICD-10-CM | POA: Diagnosis not present

## 2020-05-28 LAB — CBC WITH DIFFERENTIAL/PLATELET
Abs Immature Granulocytes: 1.65 10*3/uL — ABNORMAL HIGH (ref 0.00–0.07)
Basophils Absolute: 0.2 10*3/uL — ABNORMAL HIGH (ref 0.0–0.1)
Basophils Relative: 1 %
Eosinophils Absolute: 0.6 10*3/uL — ABNORMAL HIGH (ref 0.0–0.5)
Eosinophils Relative: 4 %
HCT: 26.9 % — ABNORMAL LOW (ref 36.0–46.0)
Hemoglobin: 8.8 g/dL — ABNORMAL LOW (ref 12.0–15.0)
Immature Granulocytes: 10 %
Lymphocytes Relative: 8 %
Lymphs Abs: 1.4 10*3/uL (ref 0.7–4.0)
MCH: 29 pg (ref 26.0–34.0)
MCHC: 32.7 g/dL (ref 30.0–36.0)
MCV: 88.8 fL (ref 80.0–100.0)
Monocytes Absolute: 3.4 10*3/uL — ABNORMAL HIGH (ref 0.1–1.0)
Monocytes Relative: 21 %
Neutro Abs: 9.5 10*3/uL — ABNORMAL HIGH (ref 1.7–7.7)
Neutrophils Relative %: 56 %
Platelets: 883 10*3/uL — ABNORMAL HIGH (ref 150–400)
RBC: 3.03 MIL/uL — ABNORMAL LOW (ref 3.87–5.11)
RDW: 15.8 % — ABNORMAL HIGH (ref 11.5–15.5)
Smear Review: NORMAL
WBC: 16.7 10*3/uL — ABNORMAL HIGH (ref 4.0–10.5)
nRBC: 0.4 % — ABNORMAL HIGH (ref 0.0–0.2)

## 2020-05-28 LAB — COMPREHENSIVE METABOLIC PANEL
ALT: 9 U/L (ref 0–44)
AST: 14 U/L — ABNORMAL LOW (ref 15–41)
Albumin: 2.6 g/dL — ABNORMAL LOW (ref 3.5–5.0)
Alkaline Phosphatase: 71 U/L (ref 38–126)
Anion gap: 7 (ref 5–15)
BUN: 37 mg/dL — ABNORMAL HIGH (ref 8–23)
CO2: 28 mmol/L (ref 22–32)
Calcium: 8.5 mg/dL — ABNORMAL LOW (ref 8.9–10.3)
Chloride: 102 mmol/L (ref 98–111)
Creatinine, Ser: 2.84 mg/dL — ABNORMAL HIGH (ref 0.44–1.00)
GFR, Estimated: 18 mL/min — ABNORMAL LOW (ref >60)
Glucose, Bld: 67 mg/dL — ABNORMAL LOW (ref 70–99)
Potassium: 4.6 mmol/L (ref 3.5–5.1)
Sodium: 137 mmol/L (ref 135–145)
Total Bilirubin: 0.5 mg/dL (ref 0.3–1.2)
Total Protein: 6.9 g/dL (ref 6.5–8.1)

## 2020-05-28 LAB — SAMPLE TO BLOOD BANK

## 2020-05-28 MED ORDER — SODIUM CHLORIDE 0.9 % IV SOLN
Freq: Once | INTRAVENOUS | Status: AC
  Filled 2020-05-28: qty 250

## 2020-05-28 MED ORDER — HEPARIN SOD (PORK) LOCK FLUSH 100 UNIT/ML IV SOLN
500.0000 [IU] | Freq: Once | INTRAVENOUS | Status: AC | PRN
  Administered 2020-05-28: 500 [IU]
  Filled 2020-05-28: qty 5

## 2020-05-28 MED ORDER — SODIUM CHLORIDE 0.9 % IV SOLN
1600.0000 mg | Freq: Once | INTRAVENOUS | Status: AC
  Administered 2020-05-28: 1600 mg via INTRAVENOUS
  Filled 2020-05-28: qty 26.3

## 2020-05-28 MED ORDER — PROCHLORPERAZINE MALEATE 10 MG PO TABS
10.0000 mg | ORAL_TABLET | Freq: Once | ORAL | Status: AC
  Administered 2020-05-28: 10 mg via ORAL
  Filled 2020-05-28: qty 1

## 2020-05-28 NOTE — Progress Notes (Signed)
[facility] OFFICE PROGRESS NOTE  Patient Care Team: [name], [name], MD as Medical Oncologist (Medical Oncology) Corey Skains, MD as Consulting Physician (Cardiology)  Cancer Staging No matching staging information was found for the patient.   Oncology History Overview Note  # OCT 2015-STAGE IV LEFT BREAST T2N1 [T=4cm; [name]; [name]; [name]; EBUS- Positive Paratrac/subcarinal LN s/p ? Taxotere [in [place]; [name]; [name];[name]; [name]; suv 3]; [name]; [name]; [name];  [name]; Ant R iliac bone; [name]; [name]; START-Taxol weekly. March 2020- Taxol every 2 weeks [PN]; SEP 2020- PET progression  # SEP 04/29/2019- ERIBULIN s/p RT - Right hip- [s/p RT- NOV 2020]; AUG 2021-PET scan progressive disease ; rising tumor markers.  Discontinued Eribulin- AUG 2021 [left foot osteomyelitis s/p toe amputation; Dr. Antonietta Barcelona. Fowler];  # SEP [id number]- GEMCIATBINE [882m/m2]  # Poorly controlled Blood sugars- improved.   # Pancreatitis Hx/PEI- on creon in past / CKD IV [creat ~ 3-4; [name]; [name]; mild left sided weakness]  # Jan 2020-  Lobular lesion on tongue- s/p excision pyogenic granuloma [Dr.McQueen]   # GENETIC TESTING/COUNSELLING: HETEROZYGOUS Cystic Fibrosis Gene [explains hx of recurrent pancreatitis]  # MOLECULAR TESTING: NA   # PALLIATIVE CARE:  1/22-Discussed/Declined ------------------------------------------------   DIAGNOSIS: [ 23007]BREAST CA; [name]; [name] ;GOALS: Palliative  CURRENT/MOST RECENT THERAPY: GEMCITABINE[C].     Carcinoma of upper-inner quadrant of left breast in female, estrogen receptor positive ([place]  04/29/2019 - 02/24/2020 Chemotherapy   The patient had eriBULin mesylate (HAL[place]) 2 mg in sodium chloride 0.9 % 100 mL chemo infusion, 2 mg, Intravenous,  Once, 12 of 14 cycles Dose modification: 1 mg/m2 (original dose 1 mg/m2, Cycle 1, Reason: Provider Judgment) Administration: 2 mg (06/03/2019), 2 mg (04/29/2019), 2 mg (06/10/2019), 2 mg (07/04/2019), 2 mg (07/11/2019), 2 mg (07/25/2019), 2 mg (08/03/2019), 2 mg (08/19/2019), 2 mg (08/26/2019), 2 mg (09/09/2019), 2 mg (09/16/2019), 2 mg (10/07/2019), 2 mg (10/14/2019), 2 mg (10/28/2019), 2 mg (11/04/2019), 2 mg (11/28/2019), 2 mg (12/09/2019), 2 mg (01/13/2020), 2 mg (01/20/2020), 2 mg (02/02/2020), 2 mg (02/13/2020), 2 mg (02/24/2020)  for chemotherapy treatment.    04/30/2020 -  Chemotherapy   The patient had gemcitabine (GEMZAR) 1,600 mg in sodium chloride 0.9 % 250 mL chemo infusion, 1,596 mg, Intravenous,  Once, 2 of 5 cycles Administration: 1,600 mg (04/30/2020), 1,600 mg (05/07/2020), 1,600 mg (05/28/2020)  for chemotherapy treatment.     INTERVAL HISTORY:  [name] 64y.o.  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  Patient is currently s/p cycle #1 of gemcitabine approximately 3 weeks ago. Held last week because of anemia- Hb- 7.5; s/p PRBC transfusion. S/p evaluation with neprhology; and PCP. S/p flu shot. Awaiting to see the Dr.Fowler; podiatry.  No nausea no vomiting.  No diarrhea.  No fevers or chills.   Review of Systems  Constitutional:  Positive for malaise/fatigue. Negative for chills, diaphoresis, fever and weight loss.  HENT:  Negative for nosebleeds and sore throat.   Eyes: Negative for double vision.  Respiratory: Negative for cough, hemoptysis, sputum production, [name], palpitations and orthopnea.  Gastrointestinal: Negative for abdominal pain, blood in stool, constipation, diarrhea, heartburn, melena, nausea and vomiting.  Genitourinary: Negative for dysuria, frequency and urgency.  Musculoskeletal: Positive for back pain and joint pain.  Skin: Negative.  Negative for itching and rash.  Neurological: Positive for tingling. Negative for dizziness, focal weakness, weakness and headaches.  Endo/Heme/Allergies: Does not bruise/bleed easily.  Psychiatric/Behavioral: Negative for depression. The patient is not nervous/anxious and does not have insomnia.     PAST MEDICAL HISTORY :  Past Medical History:  Diagnosis Date   Anemia    Anxiety    Asthma    Cancer ([place]) 03/10/2018   Per [facility] PET order. Carcinoma of upper-inner quadrant of left breast in female, estrogen receptor positive .   Cancer ([place])    LUNG   CHF (congestive heart failure) ([place]) 1997   CKD (chronic kidney disease)    Depression    Diabetes mellitus, type 2 ([place])    Family history of breast cancer    Family history of colon cancer    Family history of ovarian cancer    Family history of pancreatic cancer    Family history of prostate cancer    Family history of stomach cancer    GERD (gastroesophageal reflux disease)    history of an ulcer   Hair loss    History of left breast cancer 05/29/14   History of partial hysterectomy 12/31/2016   Per patient.  Has not had a period in years.  Had a partial hysterectomy years ago.   Hypertension    Mitral valve regurgitation    Neuromuscular disorder (HCC)    neuropathies in hand   Obesity    Pancreatitis 1997   Stroke [facility]) 2010   with mild left arm weakness    PAST SURGICAL HISTORY :   Past  Surgical History:  Procedure Laterality Date   AMPUTATION Left 03/30/2020   Procedure: AMPUTATION 5th RAY;  Surgeon: Samara Deist, DPM;  Location: [facility] OR[facility];  Service: Podiatry;  Laterality: Left;   APPLICATION OF WOUND VAC Left 03/30/2020   Procedure: APPLICATION OF WOUND VAC;  Surgeon: Samara Deist, DPM;  Location: [facility];  Service: Podiatry;  Laterality: Left;   CATARACT EXTRACTION W/PHACO Right 02/24/2019   Procedure: CATARACT EXTRACTION PHACO AND INTRAOCULAR LENS PLACEMENT ([place]) RIGHT DIABETES;  Surgeon: Marchia Meiers, MD;  Location: [facility];  Service: Ophthalmology;  Laterality: Right;  [place] 01:13.0 CDE 7.96 Fluid Pack Lot # [id number]   CATARACT EXTRACTION W/PHACO Left 03/24/2019   Procedure: CATARACT EXTRACTION PHACO AND INTRAOCULAR LENS PLACEMENT ([facility]) - left diabetic;  Surgeon: Marchia Meiers, MD;  Location: [facility];  Service: Ophthalmology;  Laterality: Left;  [place]  01:36 CDE 13.93 Fluid pack lot # [id number]   CENTRAL LINE INSERTION-TUNNELED N/A 04/04/2020   Procedure: CENTRAL LINE INSERTION-TUNNELED;  Surgeon: Katha Cabal, MD;  Location: [facility];  Service: Cardiovascular;  Laterality: N/A;   CESAREAN SECTION     CHOLECYSTECTOMY     DIALYSIS/PERMA CATHETER REMOVAL N/A 05/01/2020   Procedure: DIALYSIS/PERMA CATHETER REMOVAL;  Surgeon: Katha Cabal, MD;  Location: [facility];  Service: Cardiovascular;  Laterality: N/A;   EXCISION OF TONGUE LESION N/A 08/17/2018  Procedure: EXCISION OF TONGUE LESION WITH FROZEN SECTIONS;  Surgeon: Beverly Gust, MD;  Location: [facility];  Service: ENT;  Laterality: N/A;   EYE SURGERY Right    cataract extraction   IRRIGATION AND DEBRIDEMENT FOOT Left 03/30/2020   Procedure: IRRIGATION AND DEBRIDEMENT FOOT;  Surgeon: Samara Deist, DPM;  Location: [facility];  Service: Podiatry;  Laterality: Left;   PARTIAL HYSTERECTOMY  12/31/2016   Per patient, she has not had a period in years since she had a partial  hysterectomy.   PORTA CATH INSERTION     TUBAL LIGATION      FAMILY HISTORY :   Family History  Problem Relation Age of Onset   Ovarian cancer Mother 93   Diabetes Mother    Hypertension Mother    COPD Father    Hypertension Father    Colon cancer Father 30   Diabetes Sister    Breast cancer Sister 59       bilateral   Diabetes Brother    Leukemia Maternal Aunt    Pancreatic cancer Paternal Aunt 98   Pancreatic cancer Paternal Uncle    Colon cancer Paternal Uncle    Stomach cancer Maternal Grandfather 70   Throat cancer Paternal Grandmother    Breast cancer Maternal Aunt 80   Colon cancer Maternal Aunt    Bone cancer Maternal Aunt    Breast cancer Paternal Aunt        dx >50   Prostate cancer Paternal Uncle    Pancreatic cancer Paternal Uncle    Throat cancer Paternal Uncle    Lung cancer Paternal Uncle    Stomach cancer Paternal Uncle    Brain cancer Paternal Aunt    Cancer Cousin        liver, kidney   Prostate cancer Cousin        meastatic   Lung cancer Other     SOCIAL HISTORY:   Social History   Tobacco Use   Smoking status: Former Smoker    Packs/day: 0.50    Years: 1.00    Pack years: 0.50    Types: Cigarettes   Smokeless tobacco: Never Used  Scientific laboratory technician Use: Never used  Substance Use Topics   Alcohol use: No    Alcohol/week: 0.0 standard drinks   Drug use: No    ALLERGIES:  is allergic to fish-derived products, sulfamethoxazole-trimethoprim, and chlorhexidine.  MEDICATIONS:  No current facility-administered medications for this visit.   No current outpatient medications on file.   Facility-Administered Medications Ordered in Other Visits  Medication Dose Route Frequency Provider Last Rate Last Admin   0.9 %  sodium chloride infusion (Manually program via Guardrails IV Fluids)   Intravenous Once Fritzi Mandes, MD       0.9 %  sodium chloride infusion  250 mL Intravenous PRN Agbata, Tochukwu, MD        albuterol (VENTOLIN HFA) 108 (90 Base) MCG/ACT inhaler 2 puff  2 puff Inhalation Q6H PRN Agbata, Tochukwu, MD       ALPRAZolam (XANAX) tablet 0.5 mg  0.5 mg Oral BID PRN Agbata, Tochukwu, MD       amLODipine (NORVASC) tablet 10 mg  10 mg Oral Daily Agbata, Tochukwu, MD   10 mg at 06/04/20 0838   atenolol (TENORMIN) tablet 50 mg  50 mg Oral Daily Agbata, Tochukwu, MD   50 mg at 06/04/20 0835   bisacodyl (DULCOLAX) suppository 10 mg  10 mg Rectal Daily PRN Collier Bullock, MD  budesonide (PULMICORT) nebulizer solution 0.25 mg  0.25 mg Nebulization BID Agbata, Tochukwu, MD   0.25 mg at 06/03/20 2124   bumetanide (BUMEX) tablet 0.5 mg  0.5 mg Oral BID Agbata, Tochukwu, MD   0.5 mg at 06/04/20 0272   calcitRIOL (ROCALTROL) capsule 0.25 mcg  0.25 mcg Oral Daily Agbata, Tochukwu, MD   0.25 mcg at 06/04/20 0835   cloNIDine (CATAPRES) tablet 0.2 mg  0.2 mg Oral BID Agbata, Tochukwu, MD   0.2 mg at 06/04/20 0835   docusate sodium (COLACE) capsule 100 mg  100 mg Oral Daily Agbata, Tochukwu, MD   100 mg at 06/04/20 0835   famotidine (PEPCID) tablet 20 mg  20 mg Oral BID Agbata, Tochukwu, MD   20 mg at 06/04/20 0835   feeding supplement (GLUCERNA SHAKE) (GLUCERNA SHAKE) liquid 237 mL  237 mL Oral BID BM Fritzi Mandes, MD   237 mL at 06/04/20 0838   ferrous sulfate tablet 325 mg  325 mg Oral Q breakfast Agbata, Tochukwu, MD   325 mg at 06/04/20 0835   FLUoxetine (PROZAC) capsule 20 mg  20 mg Oral BID Agbata, Tochukwu, MD   20 mg at 06/04/20 0835   gabapentin (NEURONTIN) capsule 100 mg  100 mg Oral QHS Agbata, Tochukwu, MD   100 mg at 06/03/20 2148   insulin aspart (novoLOG) injection 0-15 Units  0-15 Units Subcutaneous TID WC Agbata, Tochukwu, MD   3 Units at 06/04/20 0835   ondansetron (ZOFRAN) tablet 4 mg  4 mg Oral Q6H PRN Agbata, Tochukwu, MD       Or   ondansetron (ZOFRAN) injection 4 mg  4 mg Intravenous Q6H PRN Agbata, Tochukwu, MD   4 mg at 06/03/20 1006    oxyCODONE-acetaminophen (PERCOCET/ROXICET) 5-325 MG per tablet 1 tablet  1 tablet Oral Q4H PRN Agbata, Tochukwu, MD   1 tablet at 06/03/20 2148   senna (SENOKOT) tablet 8.6 mg  1 tablet Oral BID PRN Agbata, Tochukwu, MD       simvastatin (ZOCOR) tablet 20 mg  20 mg Oral QHS Agbata, Tochukwu, MD   20 mg at 06/03/20 2149   sodium chloride flush (NS) 0.9 % injection 10 mL  10 mL Intravenous PRN Cammie Sickle, MD   10 mL at 01/30/16 1054   sodium chloride flush (NS) 0.9 % injection 3 mL  3 mL Intravenous Q12H Agbata, Tochukwu, MD   3 mL at 06/04/20 0855   sodium chloride flush (NS) 0.9 % injection 3 mL  3 mL Intravenous PRN Agbata, Tochukwu, MD        PHYSICAL EXAMINATION: ECOG PERFORMANCE STATUS: 1 - Symptomatic but completely ambulatory  BP 133/77    Pulse 79    Temp (!) 96.8 F (36 C) (Tympanic)    Resp 16    Ht '5\' 2"'  (1.575 m)    Wt 191 lb (86.6 kg)    SpO2 96%    BMI 34.93 kg/m   Filed Weights   05/28/20 1006  Weight: 191 lb (86.6 kg)    Physical Exam Constitutional:      Comments: She is alone.  HENT:     Head: Normocephalic and atraumatic.     Mouth/Throat:     Pharynx: No oropharyngeal exudate.  Eyes:     Pupils: Pupils are equal, round, and reactive to light.  Cardiovascular:     Rate and Rhythm: Normal rate and regular rhythm.  Pulmonary:     Effort: No respiratory distress.  Breath sounds: Normal breath sounds. No wheezing.  Abdominal:     General: Bowel sounds are normal. There is no distension.     Palpations: Abdomen is soft. There is no mass.     Tenderness: There is no abdominal tenderness. There is no guarding or rebound.  Musculoskeletal:        General: No tenderness. Normal range of motion.     Cervical back: Normal range of motion and neck supple.  Skin:    General: Skin is warm.     Comments: Left sole-ulceration noted/see picture below.  Neurological:     Mental Status: She is alert and oriented to person, place, and time.  Psychiatric:         Mood and Affect: Affect normal.     LABORATORY DATA:  I have reviewed the data as listed    Component Value Date/Time   NA 137 06/03/2020 0504   NA 130 (L) 06/06/2014 1102   K 4.2 06/03/2020 0504   K 3.9 06/06/2014 1102   CL 101 06/03/2020 0504   CL 95 (L) 06/06/2014 1102   CO2 27 06/03/2020 0504   CO2 28 06/06/2014 1102   GLUCOSE 80 06/03/2020 0504   GLUCOSE 349 (H) 06/06/2014 1102   BUN 46 (H) 06/03/2020 0504   BUN 17 06/06/2014 1102   CREATININE 2.42 (H) 06/04/2020 0446   CREATININE 1.63 (H) 06/06/2014 1102   CALCIUM 8.3 (L) 06/03/2020 0504   CALCIUM 9.2 06/06/2014 1102   PROT 6.9 05/28/2020 0829   PROT 8.2 06/06/2014 1102   ALBUMIN 2.6 (L) 05/28/2020 0829   ALBUMIN 3.3 (L) 06/06/2014 1102   AST 14 (L) 05/28/2020 0829   AST 7 (L) 06/06/2014 1102   ALT 9 05/28/2020 0829   ALT 12 (L) 06/06/2014 1102   ALKPHOS 71 05/28/2020 0829   ALKPHOS 74 06/06/2014 1102   BILITOT 0.5 05/28/2020 0829   BILITOT 0.4 06/06/2014 1102   GFRNONAA 22 (L) 06/04/2020 0446   GFRNONAA 35 (L) 06/06/2014 1102   GFRAA 24 (L) 05/07/2020 0807   GFRAA 42 (L) 06/06/2014 1102    No results found for: SPEP, UPEP  Lab Results  Component Value Date   WBC 6.6 06/04/2020   NEUTROABS 4.6 06/04/2020   HGB 7.6 (L) 06/04/2020   HCT 23.3 (L) 06/04/2020   MCV 90.0 06/04/2020   PLT 417 (H) 06/04/2020      Chemistry      Component Value Date/Time   NA 137 06/03/2020 0504   NA 130 (L) 06/06/2014 1102   K 4.2 06/03/2020 0504   K 3.9 06/06/2014 1102   CL 101 06/03/2020 0504   CL 95 (L) 06/06/2014 1102   CO2 27 06/03/2020 0504   CO2 28 06/06/2014 1102   BUN 46 (H) 06/03/2020 0504   BUN 17 06/06/2014 1102   CREATININE 2.42 (H) 06/04/2020 0446   CREATININE 1.63 (H) 06/06/2014 1102      Component Value Date/Time   CALCIUM 8.3 (L) 06/03/2020 0504   CALCIUM 9.2 06/06/2014 1102   ALKPHOS 71 05/28/2020 0829   ALKPHOS 74 06/06/2014 1102   AST 14 (L) 05/28/2020 0829   AST 7 (L) 06/06/2014  1102   ALT 9 05/28/2020 0829   ALT 12 (L) 06/06/2014 1102   BILITOT 0.5 05/28/2020 0829   BILITOT 0.4 06/06/2014 1102         RADIOGRAPHIC STUDIES: I have personally reviewed the radiological images as listed and agreed with the findings in the report. DG  Chest 2 View  Result Date: 06/02/2020 CLINICAL DATA:  Shortness of breath EXAM: CHEST - 2 VIEW COMPARISON:  March 29, 2020 FINDINGS: Stable right Port-A-Cath. No pneumothorax. Bilateral pleural effusions with underlying opacities are similar in the interval. Mild interstitial prominence. No other acute abnormalities or changes. IMPRESSION: 1. Stable bilateral pleural effusions with underlying opacities. 2. Possible mild pulmonary edema/pulmonary venous congestion. Electronically Signed   By: Dorise Bullion III M.D   On: 06/02/2020 11:29   CT Chest Wo Contrast  Result Date: 06/02/2020 CLINICAL DATA:  64 year old female with a history of shortness of breath, metastatic breast cancer EXAM: CT CHEST WITHOUT CONTRAST TECHNIQUE: Multidetector CT imaging of the chest was performed following the standard protocol without IV contrast. COMPARISON:  PET-CT 03/28/2020 FINDINGS: Cardiovascular: Heart size is unchanged. No pericardial fluid. Normal course caliber and contour of the thoracic aorta. No significant atherosclerotic changes. Right IJ port catheter. Mediastinum/Nodes: Unremarkable thyroid/thoracic inlet. Unremarkable thoracic esophagus. The subcarinal node node measures smaller than the comparison PET CT, previously approximately 10 mm, now 7 mm. Otherwise, no enlarged mediastinal lymph nodes. Lungs/Pleura: Bilateral pleural effusions, predominantly in the dependent aspects of the lungs. There is rim of hyperdensity adjacent to the low-density fluid at the costophrenic sulci and extending to the medial, posterior, and lateral pleural/fluid interface of the bilateral lungs. The fluid extends along the lateral lung to the anterior lung on the  left, with small volume of partially trapped fluid. There is hyperdense tissue thickening along the pleural mediastinal interface of the upper mediastinum, more pronounced on the left. The appearance of the nodular thickening is relatively similar to the PET CT dated 03/28/2020. Overall, the volume of fluid appears to have slightly progressed. Nodular lung changes at the periphery of the left upper lobe on image 45 of series 3, left lower lobe on image 86 of series 2, and at the lower right lung, image 75 of series 2 and image 92 of series 2. No evidence of interlobular septal thickening. There is no confluent airspace disease. No endotracheal or endobronchial debris. Upper Abdomen: No acute finding of the upper abdomen. Musculoskeletal: Redemonstration of spiculated soft tissue nodule of the left breast, compatible with the given history of breast cancer. The more inferior aspect of the mass measures slightly larger in AP dimension on the axial CT, previously 27 mm, now 30 mm. Although sclerotic foci within the skeleton are relatively unchanged, there are new/enlarging lytic lesions of the thoracic spine including: T5 on image 48 of series 2, with new lytic lesion adjacent to the sclerotic focus T6 at the anterior vertebral body, enlarging from the prior T7 at the right vertebral body, mixed lytic and sclerotic, enlarging from the prior T10 at the anterior vertebral body, enlarging T11, posterior vertebral body, enlarging IMPRESSION: Bilateral malignant pleural effusions, which are only slightly larger than the comparison PET CT of 03/28/2020, with associated evidence of visceral/parietal pleural metastases. Redemonstration skeletal metastases, with new/enlarging lytic lesions of the thoracic spine compatible with progression. The left breast mass appears to be enlarging compared to the prior PET. Electronically Signed   By: Corrie Mckusick D.O.   On: 06/02/2020 15:35   DG Foot Complete Left  Result Date:  06/02/2020 CLINICAL DATA:  Osteomyelitis of the left foot. EXAM: LEFT FOOT - COMPLETE 3+ VIEW COMPARISON:  April 03, 2020 FINDINGS: There is no evidence of fracture or dislocation. There is no evidence of cortical destructive lesions. Stable postsurgical changes from transmetatarsal amputation of the fifth ray. Soft tissue  swelling of the midfoot. IMPRESSION: 1. No radiographic evidence of osteomyelitis. 2. Soft tissue swelling of the midfoot. 3. Stable postsurgical changes from transmetatarsal amputation of the fifth ray. Electronically Signed   By: Fidela Salisbury M.D.   On: 06/02/2020 16:51     ASSESSMENT & PLAN:  Carcinoma of upper-inner quadrant of left breast in female, estrogen receptor positive ([place]) Left breast cancer- stage IV- ER/PR +, HER-2/neu.  PET AUG 2021-shows progressive lung lesion/pleural-based; bone lesions; consistent with rising tumor marker.  Currently on gemcitabine chemotherapy.  # proceed with cycle #2 of gemcitabine day 1. Labs today reviewed; acceptable; except elevated WBC-16.  # Anemia-hemoglobin 8.8; Symptomatic anemia.-Multifactorial chronic kidney disease/chemotherapy.  s/p PRBC transfusion 1 week ago.  Monitor closely..  # Chronic kidney disease - stage IV [GFR-12]- STABLE.  # Oral thrush- currently on s/p nystatin; STABLE  # Bone mets-sclerotic; Right acetabular uptake-s/p radiation. Hypocalcemia-ca-8.3/low vitD- on vit D daily. STABLE.   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 S/P oral cipro and doxycycline s/p evaluation Dr.Fitzgerald/Dr.Folwer. STABLE  #Leukocytosis-no obvious signs and symptoms of infection.  Reactive-monitor closely.  # DISPOSITION:   # chemo today #  Follow up as planned/ next week- MD; labs- cbc/cmp;ca-27-27;cea; ca-15-3-;Gemcitabine; HOLD tube/possible PRBC - Dr.B   Orders Placed This Encounter  Procedures   Cancer antigen 27.29     Standing Status:   Future    Standing Expiration Date:   05/29/2021   Cancer antigen 15-3    Standing Status:   Future    Standing Expiration Date:   05/29/2021   CEA    Standing Status:   Future    Standing Expiration Date:   05/29/2021   Hold Tube- Blood Bank    Standing Status:   Future    Standing Expiration Date:   05/29/2021   All questions were answered. The patient knows to call the clinic with any problems, questions or concerns.      Cammie Sickle, MD 06/04/2020 9:24 AM

## 2020-05-28 NOTE — Progress Notes (Signed)
Hemoglobin 8.8, ANC elevated at 9.5, Platelets elevated at 883, Glucose is 67 ( pt denies s/s, and reports not eating anything prior to reporting to clinic, pt  given something to eat and drink), and Creatinine is 2.84. Dr. Rogue Bussing aware and assessed pt in exam room. Per Dr. Rogue Bussing okay to proceed with scheduled treatment, no need for blood transfusion at this time.

## 2020-05-28 NOTE — Assessment & Plan Note (Addendum)
Left breast cancer- stage IV- ER/PR +, HER-2/neu.  PET AUG 2021-shows progressive lung lesion/pleural-based; bone lesions; consistent with rising tumor marker.  Currently on gemcitabine chemotherapy.  # proceed with cycle #2 of gemcitabine day 1. Labs today reviewed; acceptable; except elevated WBC-16.  # Anemia-hemoglobin 8.8; Symptomatic anemia.-Multifactorial chronic kidney disease/chemotherapy.  s/p PRBC transfusion 1 week ago.  Monitor closely..  # Chronic kidney disease - stage IV [GFR-12]- STABLE.  # Oral thrush- currently on s/p nystatin; STABLE  # Bone mets-sclerotic; Right acetabular uptake-s/p radiation. Hypocalcemia-ca-8.3/low vitD- on vit D daily. STABLE.   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 S/P oral cipro and doxycycline s/p evaluation Dr.Fitzgerald/Dr.Folwer. STABLE  #Leukocytosis-no obvious signs and symptoms of infection.  Reactive-monitor closely.  # DISPOSITION:   # chemo today #  Follow up as planned/ next week- MD; labs- cbc/cmp;ca-27-27;cea; ca-15-3-;Gemcitabine; HOLD tube/possible PRBC - Dr.B

## 2020-05-29 ENCOUNTER — Other Ambulatory Visit: Payer: Self-pay

## 2020-05-29 ENCOUNTER — Encounter: Payer: Medicare HMO | Admitting: Physician Assistant

## 2020-05-29 DIAGNOSIS — L97522 Non-pressure chronic ulcer of other part of left foot with fat layer exposed: Secondary | ICD-10-CM | POA: Diagnosis not present

## 2020-05-29 DIAGNOSIS — E11621 Type 2 diabetes mellitus with foot ulcer: Secondary | ICD-10-CM | POA: Diagnosis not present

## 2020-05-29 NOTE — Progress Notes (Addendum)
CHAYIL, GANTT ([id number]) Visit Report for 05/29/2020 Chief Complaint Document Details Patient Name: [name], [name] Date of Service: 05/29/2020 9:15 AM Medical Record Number: [id number] Patient Account Number: [id number] Date of Birth/Sex: April 07, 1956 (64 y.o. F) Treating RN: Cornell Barman Primary Care Provider: [name] Other Clinician: Referring Provider: [name] Treating Provider/Extender: Skipper Cliche in Treatment: 3 Information Obtained from: Patient Chief Complaint Left foot surgical ulcer Electronic Signature(s) Signed: 05/29/2020 10:03:31 AM By: Worthy Keeler PA-C Entered By: Worthy Keeler on 05/29/2020 10:03:31 [name], [name] ([id number]) -------------------------------------------------------------------------------- Debridement Details Patient Name: Gloria Both A. Date of Service: 05/29/2020 9:15 AM Medical Record Number: [id number] Patient Account Number: [id number] Date of Birth/Sex: [birth year] (64 y.o. F) Treating RN: Cornell Barman Primary Care Provider: [name] Other Clinician: Referring Provider: [name] Treating Provider/Extender: Skipper Cliche in Treatment: 3 Debridement Performed for Wound #4 Left,Lateral Foot Assessment: Performed By: Physician Tommie Sams., PA-C Debridement Type: Debridement Severity of Tissue Pre Debridement: Fat layer exposed Level of Consciousness (Pre- Awake and Alert procedure): Pre-procedure Verification/Time Out Yes - 09:55 Taken: Start Time: 10:06 Pain Control: Lidocaine 4% Topical Solution Total Area Debrided (L x W): 6 (cm) x 0.8 (cm) = 4.8 (cm) Tissue and other material Viable, Non-Viable, Slough, Subcutaneous, Slough debrided: Level: Skin/Subcutaneous Tissue Debridement Description: Excisional Instrument: Curette Bleeding: Minimum Hemostasis Achieved: Pressure End Time: 10:09 Procedural Pain: 0 Post Procedural Pain: 0 Response to Treatment: Procedure was  tolerated well Level of Consciousness (Post- Awake and Alert procedure): Post Debridement Measurements of Total Wound Length: (cm) 6 Width: (cm) 0.8 Depth: (cm) 1.2 Volume: (cm) 4.524 Character of Wound/Ulcer Post Debridement: Improved Severity of Tissue Post Debridement: Fat layer exposed Post Procedure Diagnosis Same as Pre-procedure Electronic Signature(s) Signed: 05/29/2020 4:48:14 PM By: Baruch Gouty RN, BSN Signed: 05/29/2020 5:11:17 PM By: Worthy Keeler PA-C Signed: 05/30/2020 5:48:54 PM By: Gretta Cool, BSN, RN, CWS, Kim RN, BSN Entered By: Baruch Gouty on 05/29/2020 10:07:59 Herne, Klare A. ([id number]) -------------------------------------------------------------------------------- HPI Details Patient Name: Gloria Both A. Date of Service: 05/29/2020 9:15 AM Medical Record Number: [id number] Patient Account Number: [id number] Date of Birth/Sex: 04/26/1956 (64 y.o. F) Treating RN: Cornell Barman Primary Care Provider: [name] Other Clinician: Referring Provider: [name] Treating Provider/Extender: Skipper Cliche in Treatment: 3 History of Present Illness HPI Description: 12/27/2019 upon evaluation today patient appears to be doing somewhat poorly upon initial inspection here in the clinic. She has unfortunately been having issues with for the past week a blister over her right heel that started on the plantar aspect and has spread to the medial aspect. She does have a history of diabetes mellitus type 2 she also has hypertension along with congestive heart failure. With that being said she was supposed to be having knee surgery on her right knee but this was postponed by the surgeon and she was referred to [place] due to the blister noted. Fortunately there is no signs of active infection at this time. With that being said I am concerned about the fact that this could develop into infection. Obviously we want to prevent such from happening. She does note  that she been placed on a antibiotic for urinary tract infection by her primary care provider she has that to pick up. I asked her to call and let us know what that antibiotic was so that we can put that in the chart. For that reason I am not can give her anything empirically at this point. The patient cannot remember  any injury that she had to this region in fact she really does not know how this began at all other than the fact that it "just showed up". 01/05/2020 upon evaluation today patient appears to be doing about the same in regard to her heel ulcer. She has been tolerating the dressing changes without complication. Fortunately there is no signs of active infection at this time. No fevers, chills, nausea, vomiting, or diarrhea. With that being said I do believe the skin is getting need to be removed from the heel where this is somewhat deflated there is still a lot of blood collecting underneath and I think this is not can I do her any good to be perfectly honest. Plus we do not really know exactly what everything looks like underneath as far as the actual wound is concerned. Obviously we need to figure that out. 01/12/2020 upon evaluation today patient's wound actually appears to be doing excellent in fact this is very close to complete resolution. Subsequently I also did send a culture after removing the blood blister last week and the patient did not have any bacteria noted just normal skin flora and no growth otherwise after 2 days. Fortunately there is no signs of active infection at this time systemically or locally. Overall I feel that she is doing excellent. 01/19/2020 upon evaluation today patient appears to be doing excellent at this time in regard to her heel ulcer for the most part. The one issue that we do see is that she is having a new blistered area that amount to clear away some of the skin from today. This seems to be due to her foot slipping in the offloading shoe that she  currently is utilizing. Fortunately there is no signs of active infection at this time. No fevers, chills, nausea, vomiting, or diarrhea. 01/26/2020 upon evaluation today patient's wound actually showed signs of excellent improvement. She has made great progress even since last week's visit. I do believe that she continues to tolerate the dressing changes without any complication and I am very pleased with that as well. In general I think that we will likely continue with the current measures as long as we are continuing to see the results that we are at this point. 02/02/2020 upon evaluation today patient appears to be doing excellent in regard to her wounds currently. She has been tolerating the dressing changes without complication in fact this appears to be completely healed today based on what I am seeing. Readmission: 03/26/2020 on evaluation today patient presents for readmission concerning an issue that she is actually having with her opposite foot compared to the one that I treated last time she was with this back in July when she healed. Subsequently she tells me that she really does not know how this came up. She does not have any particular injury that she is aware of at this point. With that being said she tells me that the area came up somewhat unexpectedly and has been dark and similar to what she had before. She has had some drainage from it as well. She has been on Keflex initially for urinary tract infection then her doctor actually gave her a second round due to the heel itself in order to try to help with this. Nonetheless she still has erythema and warmth of the foot I am concerned that the Keflex is not the appropriate medication. Previously we used Augmentin but that is very similar to the Keflex to be honest. I am  not really sure if that would be the best option for her at this time. Nonetheless she probably needs something oral while we await the results of the culture and then if  we need to make any adjustments or changes we can do so at that point. I do need to keep her kidney function in consideration with this. 05/04/2020 upon evaluation today patient presents for reevaluation here in the clinic after having had surgery as performed by Dr. Vickki Muff. She actually goes back to see him this coming week on October 5. She still has sutures in place at the moment currently he has recommended Betadine to the wound area followed by dry gauze dressing and cleansing with saline. Her surgery was roughly 4 weeks ago. She tells me that she is really not having any significant pain which is great news. Overall I am pleased with where things stand. I do believe that once the sutures are out this may potentially slightly dehisced nonetheless I think that we should be able to hopefully get this to close effectively without much complication. However time will tell we will see what happens once the sutures are indeed removed. 05/11/2020 upon evaluation today patient appears to be doing decently well in regard to her foot ulcer. Fortunately there is no signs of active infection at this time. Actually appears to be healing okay and the sutures have been removed at this point. I do believe talking the alginate into the crease where this is healing will be appropriate to try to keep this area nice and clean and allow it to hopefully continue to heal. Fortunately there is no signs of active infection. 05/29/2020 upon evaluation today patient appears to be doing excellent in regard to her foot ulcer all things considered she does have some necrotic tissue in the central portion of the wound but fortunately this does not seem to be a significant issue at this point. Overall I am extremely pleased with how the patient in general seems to be progressing. Electronic Signature(s) Signed: 05/29/2020 5:02:20 PM By: Haydee Monica, Gizelle AMarland Kitchen ([id number]) Entered By: Worthy Keeler on 05/29/2020  17:02:20 [name] ([id number]) -------------------------------------------------------------------------------- Physical Exam Details Patient Name: Gloria Both A. Date of Service: 05/29/2020 9:15 AM Medical Record Number: [id number] Patient Account Number: [id number] Date of Birth/Sex: [birth year] (64 y.o. F) Treating RN: Cornell Barman Primary Care Provider: [name] Other Clinician: Referring Provider: [name] Treating Provider/Extender: Skipper Cliche in Treatment: 3 Constitutional Well-nourished and well-hydrated in no acute distress. Respiratory normal breathing without difficulty. Psychiatric this patient is able to make decisions and demonstrates good insight into disease process. Alert and Oriented x 3. pleasant and cooperative. Notes Patient's wound bed did require sharp debridement today to clear away some of the necrotic debris from the surface of the wound and she tolerated this today without complication. Fortunately post debridement the patient's wound bed actually seem to be doing significantly better which was great news. Electronic Signature(s) Signed: 05/29/2020 5:03:07 PM By: Worthy Keeler PA-C Entered By: Worthy Keeler on 05/29/2020 17:03:06 [name], [name] ([id number]) -------------------------------------------------------------------------------- Physician Orders Details Patient Name: Gloria Both A. Date of Service: 05/29/2020 9:15 AM Medical Record Number: [id number] Patient Account Number: [id number] Date of Birth/Sex: June 04, 1956 (64 y.o. F) Treating RN: Cornell Barman Primary Care Provider: [name] Other Clinician: Referring Provider: [name] Treating Provider/Extender: Skipper Cliche in Treatment: 3 Verbal / Phone Orders: No Diagnosis Coding ICD-10 Coding Code Description E11.621 Type 2 diabetes  mellitus with foot ulcer L97.523 Non-pressure chronic ulcer of other part of left foot with necrosis of  muscle I10 Essential (primary) hypertension I50.42 Chronic combined systolic (congestive) and diastolic (congestive) heart failure Wound Cleansing Wound #4 Left,Lateral Foot o Dial antibacterial soap, wash wounds, rinse and pat dry prior to dressing wounds Primary Wound Dressing Wound #4 Left,Lateral Foot o Silver Alginate Secondary Dressing Wound #4 Left,Lateral Foot o ABD and Kerlix/Conform o Other - ace wrap to hold all in place Dressing Change Frequency Wound #4 Left,Lateral Foot o Change dressing every other day. Follow-up Appointments Wound #4 Left,Lateral Foot o Return Appointment in 2 weeks. Off-Loading o Open toe surgical shoe Patient Medications Allergies: sea food, Bactrim Notifications Medication Indication Start End lidocaine prior to debridement 05/29/2020 DOSE topical 4 % cream - cream topical Electronic Signature(s) Signed: 05/29/2020 4:48:14 PM By: Baruch Gouty RN, BSN Signed: 05/29/2020 5:11:17 PM By: Worthy Keeler PA-C Entered By: Baruch Gouty on 05/29/2020 10:10:13 [name], Gloria AMarland Kitchen ([id number]) -------------------------------------------------------------------------------- Problem List Details Patient Name: Gloria Both A. Date of Service: 05/29/2020 9:15 AM Medical Record Number: [id number] Patient Account Number: [id number] Date of Birth/Sex: 12-Jun-1956 (64 y.o. F) Treating RN: Cornell Barman Primary Care Provider: [name] Other Clinician: Referring Provider: [name] Treating Provider/Extender: Skipper Cliche in Treatment: 3 Active Problems ICD-10 Encounter Code Description Active Date MDM Diagnosis E11.621 Type 2 diabetes mellitus with foot ulcer 05/04/2020 No Yes L97.523 Non-pressure chronic ulcer of other part of left foot with necrosis of 05/04/2020 No Yes muscle I10 Essential (primary) hypertension 05/04/2020 No Yes I50.42 Chronic combined systolic (congestive) and diastolic (congestive) heart 05/04/2020  No Yes failure Inactive Problems Resolved Problems Electronic Signature(s) Signed: 05/29/2020 10:03:25 AM By: Worthy Keeler PA-C Entered By: Worthy Keeler on 05/29/2020 10:03:24 [name], Gloria AMarland Kitchen ([id number]) -------------------------------------------------------------------------------- Progress Note Details Patient Name: Gloria Both A. Date of Service: 05/29/2020 9:15 AM Medical Record Number: [id number] Patient Account Number: [id number] Date of Birth/Sex: 1956/06/16 (64 y.o. F) Treating RN: Cornell Barman Primary Care Provider: [name] Other Clinician: Referring Provider: [name] Treating Provider/Extender: Skipper Cliche in Treatment: 3 Subjective Chief Complaint Information obtained from Patient Left foot surgical ulcer History of Present Illness (HPI) 12/27/2019 upon evaluation today patient appears to be doing somewhat poorly upon initial inspection here in the clinic. She has unfortunately been having issues with for the past week a blister over her right heel that started on the plantar aspect and has spread to the medial aspect. She does have a history of diabetes mellitus type 2 she also has hypertension along with congestive heart failure. With that being said she was supposed to be having knee surgery on her right knee but this was postponed by the surgeon and she was referred to [place] due to the blister noted. Fortunately there is no signs of active infection at this time. With that being said I am concerned about the fact that this could develop into infection. Obviously we want to prevent such from happening. She does note that she been placed on a antibiotic for urinary tract infection by her primary care provider she has that to pick up. I asked her to call and let us know what that antibiotic was so that we can put that in the chart. For that reason I am not can give her anything empirically at this point. The patient cannot remember any injury that  she had to this region in fact she really does not know how this began at all other  than the fact that it "just showed up". 01/05/2020 upon evaluation today patient appears to be doing about the same in regard to her heel ulcer. She has been tolerating the dressing changes without complication. Fortunately there is no signs of active infection at this time. No fevers, chills, nausea, vomiting, or diarrhea. With that being said I do believe the skin is getting need to be removed from the heel where this is somewhat deflated there is still a lot of blood collecting underneath and I think this is not can I do her any good to be perfectly honest. Plus we do not really know exactly what everything looks like underneath as far as the actual wound is concerned. Obviously we need to figure that out. 01/12/2020 upon evaluation today patient's wound actually appears to be doing excellent in fact this is very close to complete resolution. Subsequently I also did send a culture after removing the blood blister last week and the patient did not have any bacteria noted just normal skin flora and no growth otherwise after 2 days. Fortunately there is no signs of active infection at this time systemically or locally. Overall I feel that she is doing excellent. 01/19/2020 upon evaluation today patient appears to be doing excellent at this time in regard to her heel ulcer for the most part. The one issue that we do see is that she is having a new blistered area that amount to clear away some of the skin from today. This seems to be due to her foot slipping in the offloading shoe that she currently is utilizing. Fortunately there is no signs of active infection at this time. No fevers, chills, nausea, vomiting, or diarrhea. 01/26/2020 upon evaluation today patient's wound actually showed signs of excellent improvement. She has made great progress even since last week's visit. I do believe that she continues to tolerate the  dressing changes without any complication and I am very pleased with that as well. In general I think that we will likely continue with the current measures as long as we are continuing to see the results that we are at this point. 02/02/2020 upon evaluation today patient appears to be doing excellent in regard to her wounds currently. She has been tolerating the dressing changes without complication in fact this appears to be completely healed today based on what I am seeing. Readmission: 03/26/2020 on evaluation today patient presents for readmission concerning an issue that she is actually having with her opposite foot compared to the one that I treated last time she was with this back in July when she healed. Subsequently she tells me that she really does not know how this came up. She does not have any particular injury that she is aware of at this point. With that being said she tells me that the area came up somewhat unexpectedly and has been dark and similar to what she had before. She has had some drainage from it as well. She has been on Keflex initially for urinary tract infection then her doctor actually gave her a second round due to the heel itself in order to try to help with this. Nonetheless she still has erythema and warmth of the foot I am concerned that the Keflex is not the appropriate medication. Previously we used Augmentin but that is very similar to the Keflex to be honest. I am not really sure if that would be the best option for her at this time. Nonetheless she probably needs something oral  while we await the results of the culture and then if we need to make any adjustments or changes we can do so at that point. I do need to keep her kidney function in consideration with this. 05/04/2020 upon evaluation today patient presents for reevaluation here in the clinic after having had surgery as performed by Dr. Vickki Muff. She actually goes back to see him this coming week on October 5.  She still has sutures in place at the moment currently he has recommended Betadine to the wound area followed by dry gauze dressing and cleansing with saline. Her surgery was roughly 4 weeks ago. She tells me that she is really not having any significant pain which is great news. Overall I am pleased with where things stand. I do believe that once the sutures are out this may potentially slightly dehisced nonetheless I think that we should be able to hopefully get this to close effectively without much complication. However time will tell we will see what happens once the sutures are indeed removed. 05/11/2020 upon evaluation today patient appears to be doing decently well in regard to her foot ulcer. Fortunately there is no signs of active infection at this time. Actually appears to be healing okay and the sutures have been removed at this point. I do believe talking the alginate into the crease where this is healing will be appropriate to try to keep this area nice and clean and allow it to hopefully continue to heal. Fortunately there is no signs of active infection. 05/29/2020 upon evaluation today patient appears to be doing excellent in regard to her foot ulcer all things considered she does have some necrotic tissue in the central portion of the wound but fortunately this does not seem to be a significant issue at this point. Overall I am extremely pleased with how the patient in general seems to be progressing. [name], [name] ([id number]) Objective Constitutional Well-nourished and well-hydrated in no acute distress. Vitals Time Taken: 9:32 AM, Height: 62 in, Weight: 196 lbs, BMI: 35.8, Temperature: 98.1 F, Pulse: 88 bpm, Respiratory Rate: 18 breaths/min, Blood Pressure: 155/89 mmHg. Respiratory normal breathing without difficulty. Psychiatric this patient is able to make decisions and demonstrates good insight into disease process. Alert and Oriented x 3. pleasant and  cooperative. General Notes: Patient's wound bed did require sharp debridement today to clear away some of the necrotic debris from the surface of the wound and she tolerated this today without complication. Fortunately post debridement the patient's wound bed actually seem to be doing significantly better which was great news. Integumentary (Hair, Skin) Wound #4 status is Open. Original cause of wound was Surgical Injury. The wound is located on the Left,Lateral Foot. The wound measures 6cm length x 0.8cm width x 1.2cm depth; 3.77cm^2 area and 4.524cm^3 volume. There is Fat Layer (Subcutaneous Tissue) exposed. There is no tunneling or undermining noted. There is a small amount of serous drainage noted. The wound margin is distinct with the outline attached to the wound base. There is no granulation within the wound bed. There is a medium (34-66%) amount of necrotic tissue within the wound bed including Adherent Slough. Assessment Active Problems ICD-10 Type 2 diabetes mellitus with foot ulcer Non-pressure chronic ulcer of other part of left foot with necrosis of muscle Essential (primary) hypertension Chronic combined systolic (congestive) and diastolic (congestive) heart failure Procedures Wound #4 Pre-procedure diagnosis of Wound #4 is a Diabetic Wound/Ulcer of the Lower Extremity located on the Left,Lateral Foot .Severity of Tissue  Pre Debridement is: Fat layer exposed. There was a Excisional Skin/Subcutaneous Tissue Debridement with a total area of 4.8 sq cm performed by Tommie Sams., PA-C. With the following instrument(s): Curette to remove Viable and Non-Viable tissue/material. Material removed includes Subcutaneous Tissue and Slough and after achieving pain control using Lidocaine 4% Topical Solution. No specimens were taken. A time out was conducted at 09:55, prior to the start of the procedure. A Minimum amount of bleeding was controlled with Pressure. The procedure was  tolerated well with a pain level of 0 throughout and a pain level of 0 following the procedure. Post Debridement Measurements: 6cm length x 0.8cm width x 1.2cm depth; 4.524cm^3 volume. Character of Wound/Ulcer Post Debridement is improved. Severity of Tissue Post Debridement is: Fat layer exposed. Post procedure Diagnosis Wound #4: Same as Pre-Procedure Plan [name], [name] ([id number]) Wound Cleansing: Wound #4 Left,Lateral Foot: Dial antibacterial soap, wash wounds, rinse and pat dry prior to dressing wounds Primary Wound Dressing: Wound #4 Left,Lateral Foot: Silver Alginate Secondary Dressing: Wound #4 Left,Lateral Foot: ABD and Kerlix/Conform Other - ace wrap to hold all in place Dressing Change Frequency: Wound #4 Left,Lateral Foot: Change dressing every other day. Follow-up Appointments: Wound #4 Left,Lateral Foot: Return Appointment in 2 weeks. Off-Loading: Open toe surgical shoe The following medication(s) was prescribed: lidocaine topical 4 % cream cream topical for prior to debridement was prescribed at facility 1. I am going to suggest currently that we have the patient continue to utilize the silver alginate dressing I think this is good to be good to talk into the wound bed I do not think the Betadine is necessary any longer. 2. I am also can recommend that we continue with an ABD pad and roll gauze to secure along with an Ace wrap to hold in place. 3. I am also can I suggest that we have the patient continue with open toe surgical shoes to try to keep things under control here. We will see patient back for reevaluation in 2 weeks here in the clinic. If anything worsens or changes patient will contact our office for additional recommendations. Electronic Signature(s) Signed: 05/29/2020 5:03:54 PM By: Worthy Keeler PA-C Entered By: Worthy Keeler on 05/29/2020 17:03:54 [name], [name]  ([id number]) -------------------------------------------------------------------------------- SuperBill Details Patient Name: Gloria Both A. Date of Service: 05/29/2020 Medical Record Number: [id number] Patient Account Number: [id number] Date of Birth/Sex: 25-Oct-1955 (64 y.o. 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 I10 Essential (primary) hypertension I50.42 Chronic combined systolic (congestive) and diastolic (congestive) heart failure Facility Procedures CPT4 Code: 55732202 Description: 54270 - DEB SUBQ TISSUE 20 SQ CM/< Modifier: Quantity: 1 CPT4 Code: Description: ICD-10 Diagnosis Description 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

## 2020-05-30 DIAGNOSIS — E1169 Type 2 diabetes mellitus with other specified complication: Secondary | ICD-10-CM | POA: Diagnosis not present

## 2020-05-30 DIAGNOSIS — I051 Rheumatic mitral insufficiency: Secondary | ICD-10-CM | POA: Diagnosis not present

## 2020-05-30 DIAGNOSIS — Z4781 Encounter for orthopedic aftercare following surgical amputation: Secondary | ICD-10-CM | POA: Diagnosis not present

## 2020-05-30 DIAGNOSIS — I130 Hypertensive heart and chronic kidney disease with heart failure and stage 1 through stage 4 chronic kidney disease, or unspecified chronic kidney disease: Secondary | ICD-10-CM | POA: Diagnosis not present

## 2020-05-30 DIAGNOSIS — E1122 Type 2 diabetes mellitus with diabetic chronic kidney disease: Secondary | ICD-10-CM | POA: Diagnosis not present

## 2020-05-30 DIAGNOSIS — N184 Chronic kidney disease, stage 4 (severe): Secondary | ICD-10-CM | POA: Diagnosis not present

## 2020-05-30 DIAGNOSIS — I509 Heart failure, unspecified: Secondary | ICD-10-CM | POA: Diagnosis not present

## 2020-05-30 DIAGNOSIS — C50919 Malignant neoplasm of unspecified site of unspecified female breast: Secondary | ICD-10-CM | POA: Diagnosis not present

## 2020-05-30 DIAGNOSIS — M869 Osteomyelitis, unspecified: Secondary | ICD-10-CM | POA: Diagnosis not present

## 2020-05-31 NOTE — Progress Notes (Signed)
[name] ([id number]) Visit Report for 05/29/2020 Arrival Information Details Patient Name: [name], [name] Date of Service: 05/29/2020 9:15 AM Medical Record Number: [id number] Patient Account Number: [id number] Date of Birth/Sex: [birth year] (64 y.o. F) Treating RN: Cornell Barman Primary Care Artemisa Sladek: [name] Other Clinician: Referring Mose Colaizzi: [name] Treating Bayyinah Dukeman/Extender: Skipper Cliche in Treatment: 3 Visit Information History Since Last Visit Added or deleted any medications: No Patient Arrived: Wheel Chair Any new allergies or adverse reactions: No Arrival Time: 09:30 Had a fall or experienced change in No Accompanied By: fiance activities of daily living that may affect Transfer Assistance: None risk of falls: Patient Identification Verified: Yes Signs or symptoms of abuse/neglect since last visito No Secondary Verification Process Completed: Yes Hospitalized since last visit: No Patient Requires Transmission-Based Precautions: No Implantable device outside of the clinic excluding No Patient Has Alerts: No cellular tissue based products placed in the center since last visit: Has Dressing in Place as Prescribed: Yes Has Compression in Place as Prescribed: Yes Has Footwear/Offloading in Place as Prescribed: Yes Left: Surgical Shoe with Pressure Relief Insole Pain Present Now: No Electronic Signature(s) Signed: 05/29/2020 4:01:56 PM By: Lorine Bears RCP, RRT, CHT Entered By: Lorine Bears on 05/29/2020 09:31:56 [name], [name] ([id number]) -------------------------------------------------------------------------------- Encounter Discharge Information Details Patient Name: Gloria Both A. Date of Service: 05/29/2020 9:15 AM Medical Record Number: [id number] Patient Account Number: [id number] Date of Birth/Sex: [birth year] (64 y.o. F) Treating RN: Cornell Barman Primary Care Olin Gurski: [name] Other  Clinician: Referring Ketra Duchesne: [name] Treating Aarib Pulido/Extender: Skipper Cliche in Treatment: 3 Encounter Discharge Information Items Post Procedure Vitals Discharge Condition: Stable Temperature (F): 98.1 Ambulatory Status: Wheelchair Pulse (bpm): 88 Discharge Destination: Home Respiratory Rate (breaths/min): 18 Transportation: Private Auto Blood Pressure (mmHg): 155/89 Accompanied By: spouse Schedule Follow-up Appointment: Yes Clinical Summary of Care: Patient Declined Electronic Signature(s) Signed: 05/29/2020 4:48:14 PM By: Baruch Gouty RN, BSN Entered By: Baruch Gouty on 05/29/2020 10:24:28 [name] ([id number]) -------------------------------------------------------------------------------- Lower Extremity Assessment Details Patient Name: Gloria Both A. Date of Service: 05/29/2020 9:15 AM Medical Record Number: [id number] Patient Account Number: [id number] Date of Birth/Sex: February 08, 1956 (64 y.o. F) Treating RN: Cornell Barman Primary Care Elyse Prevo: [name] Other Clinician: Referring Travell Desaulniers: [name] Treating Guy Seese/Extender: Skipper Cliche in Treatment: 3 Edema Assessment Assessed: [Left: No] [Right: No] Edema: [Left: Ye] [Right: s] Calf Left: Right: Point of Measurement: 26 cm From Medial Instep 45 cm Ankle Left: Right: Point of Measurement: 12 cm From Medial Instep 29 cm Vascular Assessment Pulses: Dorsalis Pedis Palpable: [Left:Yes] Posterior Tibial Palpable: [Left:Yes] Electronic Signature(s) Signed: 05/30/2020 5:48:54 PM By: Gretta Cool, BSN, RN, CWS, Kim RN, BSN Entered By: Gretta Cool, BSN, RN, CWS, Kim on 05/29/2020 09:50:00 [name], [name] ([id number]) -------------------------------------------------------------------------------- Multi Wound Chart Details Patient Name: Gloria Both A. Date of Service: 05/29/2020 9:15 AM Medical Record Number: [id number] Patient Account Number: [id number] Date of Birth/Sex:  1955/10/25 (64 y.o. F) Treating RN: Cornell Barman Primary Care Zoi Devine: [name] Other Clinician: Referring Chalmer Zheng: [name] Treating Shaune Malacara/Extender: Skipper Cliche in Treatment: 3 Vital Signs Height(in): 62 Pulse(bpm): 88 Weight(lbs): 196 Blood Pressure(mmHg): 155/89 Body Mass Index(BMI): 36 Temperature(F): 98.1 Respiratory Rate(breaths/min): 18 Photos: [N/A:N/A] Wound Location: Left, Lateral Foot N/A N/A Wounding Event: Surgical Injury N/A N/A Primary Etiology: Diabetic Wound/Ulcer of the Lower N/A N/A Extremity Comorbid History: Cataracts, Asthma, Congestive N/A N/A Heart Failure, Hypertension, Type II Diabetes, End Stage Renal Disease, History of pressure wounds, Received Chemotherapy Date Acquired: 04/06/2020 N/A N/A  Weeks of Treatment: 3 N/A N/A Wound Status: Open N/A N/A Measurements L x W x D (cm) 6x0.8x1.2 N/A N/A Area (cm) : 3.77 N/A N/A Volume (cm) : 4.524 N/A N/A % Reduction in Area: -772.70% N/A N/A % Reduction in Volume: -10420.90% N/A N/A Classification: Unable to visualize wound bed N/A N/A Exudate Amount: Small N/A N/A Exudate Type: Serous N/A N/A Exudate Color: amber N/A N/A Wound Margin: Distinct, outline attached N/A N/A Granulation Amount: None Present (0%) N/A N/A Necrotic Amount: Medium (34-66%) N/A N/A Exposed Structures: Fat Layer (Subcutaneous Tissue): N/A N/A Yes Fascia: No Tendon: No Muscle: No Joint: No Bone: No Epithelialization: None N/A N/A Treatment Notes Electronic Signature(s) Signed: 05/30/2020 5:48:54 PM By: Gretta Cool, BSN, RN, CWS, Kim RN, BSN Entered By: Gretta Cool, BSN, RN, CWS, Kim on 05/29/2020 09:50:39 [name], [name] ([id number]) -------------------------------------------------------------------------------- [place] Details Patient Name: Gloria Both A. Date of Service: 05/29/2020 9:15 AM Medical Record Number: [id number] Patient Account Number: [id number] Date of Birth/Sex:  June 08, 1956 (64 y.o. F) Treating RN: Cornell Barman Primary Care Ripley Lovecchio: [name] Other Clinician: Referring Yeng Perz: [name] Treating Samani Deal/Extender: Skipper Cliche in Treatment: 3 Active Inactive Wound/Skin Impairment Nursing Diagnoses: Impaired tissue integrity Knowledge deficit related to ulceration/compromised skin integrity Goals: Patient/caregiver will verbalize understanding of skin care regimen Date Initiated: 05/04/2020 Target Resolution Date: 05/13/2020 Goal Status: Active Interventions: Assess patient/caregiver ability to obtain necessary supplies Assess patient/caregiver ability to perform ulcer/skin care regimen upon admission and as needed Provide education on ulcer and skin care Notes: Electronic Signature(s) Signed: 05/30/2020 5:48:54 PM By: Gretta Cool, BSN, RN, CWS, Kim RN, BSN Entered By: Gretta Cool, BSN, RN, CWS, Kim on 05/29/2020 09:50:22 [name], [name] ([id number]) -------------------------------------------------------------------------------- Pain Assessment Details Patient Name: Gloria Both A. Date of Service: 05/29/2020 9:15 AM Medical Record Number: [id number] Patient Account Number: [id number] Date of Birth/Sex: [birth year] (64 y.o. F) Treating RN: Cornell Barman Primary Care September Mormile: [name] Other Clinician: Referring Osie Merkin: [name] Treating Juanetta Negash/Extender: Skipper Cliche in Treatment: 3 Active Problems Location of Pain Severity and Description of Pain Patient Has Paino No Site Locations With Dressing Change: No Rate the pain. Current Pain Level: 0 Pain Management and Medication Current Pain Management: Electronic Signature(s) Signed: 05/30/2020 5:48:54 PM By: Gretta Cool, BSN, RN, CWS, Kim RN, BSN Entered By: Gretta Cool, BSN, RN, CWS, Kim on 05/29/2020 09:39:54 [name], [name] ([id number]) -------------------------------------------------------------------------------- Patient/Caregiver Education Details Patient  Name: Gloria Both A. Date of Service: 05/29/2020 9:15 AM Medical Record Number: [id number] Patient Account Number: [id number] Date of Birth/Gender: [birth year] (64 y.o. F) Treating RN: Cornell Barman Primary Care Physician: [name] Other Clinician: Referring Physician: [name] Treating Physician/Extender: Skipper Cliche in Treatment: 3 Education Assessment Education Provided To: Patient Education Topics Provided Offloading: Methods: Explain/Verbal Responses: Reinforcements needed, State content correctly Wound/Skin Impairment: Methods: Explain/Verbal Responses: Reinforcements needed, State content correctly Electronic Signature(s) Signed: 05/29/2020 4:48:14 PM By: Baruch Gouty RN, BSN Entered By: Baruch Gouty on 05/29/2020 10:11:45 [name], [name] Kitchen ([id number]) -------------------------------------------------------------------------------- Wound Assessment Details Patient Name: Gloria Both A. Date of Service: 05/29/2020 9:15 AM Medical Record Number: [id number] Patient Account Number: [id number] Date of Birth/Sex: 21-Oct-1955 (63 y.o. F) Treating RN: Cornell Barman Primary Care Genee Rann: [name] Other Clinician: Referring Madelina Sanda: [name] Treating Lanayah Gartley/Extender: Skipper Cliche in Treatment: 3 Wound Status Wound Number: 4 Primary Diabetic Wound/Ulcer of the Lower Extremity Etiology: Wound Location: Left, Lateral Foot Wound Open Wounding Event: Surgical Injury Status: Date Acquired: 04/06/2020 Comorbid Cataracts, Asthma, Congestive Heart Failure, Weeks Of Treatment: 3  History: Hypertension, Type II Diabetes, End Stage Renal Disease, Clustered Wound: No History of pressure wounds, Received Chemotherapy Photos Wound Measurements Length: (cm) 6 Width: (cm) 0.8 Depth: (cm) 1.2 Area: (cm) 3.77 Volume: (cm) 4.524 % Reduction in Area: -772.7% % Reduction in Volume: -10420.9% Epithelialization: None Tunneling:  No Undermining: No Wound Description Classification: Unable to visualize wound bed Fou Wound Margin: Distinct, outline attached Slo Exudate Amount: Small Exudate Type: Serous Exudate Color: amber l Odor After Cleansing: No ugh/Fibrino No Wound Bed Granulation Amount: None Present (0%) Exposed Structure Necrotic Amount: Medium (34-66%) Fascia Exposed: No Necrotic Quality: Adherent Slough Fat Layer (Subcutaneous Tissue) Exposed: Yes Tendon Exposed: No Muscle Exposed: No Joint Exposed: No Bone Exposed: No Treatment Notes Wound #4 (Left, Lateral Foot) Notes scell, abd, conform, ace wrap Electronic Signature(s) [name], [name] ([id number]) Signed: 05/30/2020 5:48:54 PM By: Gretta Cool, BSN, RN, CWS, Kim RN, BSN Entered By: Gretta Cool, BSN, RN, CWS, Kim on 05/29/2020 09:46:01 [name], [name] ([id number]) -------------------------------------------------------------------------------- [place] Details Patient Name: Gloria Both A. Date of Service: 05/29/2020 9:15 AM Medical Record Number: [id number] Patient Account Number: [id number] Date of Birth/Sex: 25-Oct-1955 (64 y.o. F) Treating RN: Cornell Barman Primary Care Shenica Holzheimer: [name] Other Clinician: Referring Keaton Beichner: [name] Treating Dreama Kuna/Extender: Skipper Cliche in Treatment: 3 Vital Signs Time Taken: 09:32 Temperature (F): 98.1 Height (in): 62 Pulse (bpm): 88 Weight (lbs): 196 Respiratory Rate (breaths/min): 18 Body Mass Index (BMI): 35.8 Blood Pressure (mmHg): 155/89 Reference Range: 80 - 120 mg / dl Electronic Signature(s) Signed: 05/29/2020 4:01:56 PM By: Lorine Bears RCP, RRT, CHT Entered By: Lorine Bears on 05/29/2020 09:34:32

## 2020-06-02 ENCOUNTER — Other Ambulatory Visit: Payer: Self-pay

## 2020-06-02 ENCOUNTER — Encounter: Payer: Self-pay | Admitting: Emergency Medicine

## 2020-06-02 ENCOUNTER — Emergency Department: Payer: Medicare HMO

## 2020-06-02 ENCOUNTER — Inpatient Hospital Stay
Admission: EM | Admit: 2020-06-02 | Discharge: 2020-06-06 | DRG: 571 | Disposition: A | Discharge status: Home Health | Payer: Medicare HMO | Attending: Internal Medicine | Admitting: Internal Medicine

## 2020-06-02 ENCOUNTER — Other Ambulatory Visit: Payer: Self-pay | Admitting: Hematology and Oncology

## 2020-06-02 ENCOUNTER — Inpatient Hospital Stay: Payer: Medicare HMO

## 2020-06-02 DIAGNOSIS — D6481 Anemia due to antineoplastic chemotherapy: Secondary | ICD-10-CM | POA: Diagnosis present

## 2020-06-02 DIAGNOSIS — L97529 Non-pressure chronic ulcer of other part of left foot with unspecified severity: Secondary | ICD-10-CM | POA: Diagnosis present

## 2020-06-02 DIAGNOSIS — I340 Nonrheumatic mitral (valve) insufficiency: Secondary | ICD-10-CM | POA: Diagnosis present

## 2020-06-02 DIAGNOSIS — L97422 Non-pressure chronic ulcer of left heel and midfoot with fat layer exposed: Secondary | ICD-10-CM | POA: Diagnosis not present

## 2020-06-02 DIAGNOSIS — Z87891 Personal history of nicotine dependence: Secondary | ICD-10-CM

## 2020-06-02 DIAGNOSIS — D631 Anemia in chronic kidney disease: Secondary | ICD-10-CM | POA: Diagnosis not present

## 2020-06-02 DIAGNOSIS — E11621 Type 2 diabetes mellitus with foot ulcer: Secondary | ICD-10-CM | POA: Diagnosis present

## 2020-06-02 DIAGNOSIS — I129 Hypertensive chronic kidney disease with stage 1 through stage 4 chronic kidney disease, or unspecified chronic kidney disease: Secondary | ICD-10-CM | POA: Diagnosis not present

## 2020-06-02 DIAGNOSIS — I69334 Monoplegia of upper limb following cerebral infarction affecting left non-dominant side: Secondary | ICD-10-CM | POA: Diagnosis not present

## 2020-06-02 DIAGNOSIS — E1122 Type 2 diabetes mellitus with diabetic chronic kidney disease: Secondary | ICD-10-CM | POA: Diagnosis present

## 2020-06-02 DIAGNOSIS — Z7982 Long term (current) use of aspirin: Secondary | ICD-10-CM

## 2020-06-02 DIAGNOSIS — Z9049 Acquired absence of other specified parts of digestive tract: Secondary | ICD-10-CM

## 2020-06-02 DIAGNOSIS — N2581 Secondary hyperparathyroidism of renal origin: Secondary | ICD-10-CM | POA: Diagnosis present

## 2020-06-02 DIAGNOSIS — T451X5A Adverse effect of antineoplastic and immunosuppressive drugs, initial encounter: Secondary | ICD-10-CM | POA: Diagnosis present

## 2020-06-02 DIAGNOSIS — D649 Anemia, unspecified: Secondary | ICD-10-CM | POA: Diagnosis not present

## 2020-06-02 DIAGNOSIS — Z9889 Other specified postprocedural states: Secondary | ICD-10-CM | POA: Diagnosis not present

## 2020-06-02 DIAGNOSIS — E1169 Type 2 diabetes mellitus with other specified complication: Secondary | ICD-10-CM | POA: Diagnosis present

## 2020-06-02 DIAGNOSIS — R262 Difficulty in walking, not elsewhere classified: Secondary | ICD-10-CM | POA: Diagnosis not present

## 2020-06-02 DIAGNOSIS — Z923 Personal history of irradiation: Secondary | ICD-10-CM

## 2020-06-02 DIAGNOSIS — D72829 Elevated white blood cell count, unspecified: Secondary | ICD-10-CM | POA: Diagnosis not present

## 2020-06-02 DIAGNOSIS — I130 Hypertensive heart and chronic kidney disease with heart failure and stage 1 through stage 4 chronic kidney disease, or unspecified chronic kidney disease: Secondary | ICD-10-CM | POA: Diagnosis present

## 2020-06-02 DIAGNOSIS — N184 Chronic kidney disease, stage 4 (severe): Secondary | ICD-10-CM | POA: Diagnosis present

## 2020-06-02 DIAGNOSIS — C799 Secondary malignant neoplasm of unspecified site: Secondary | ICD-10-CM | POA: Diagnosis present

## 2020-06-02 DIAGNOSIS — C50919 Malignant neoplasm of unspecified site of unspecified female breast: Secondary | ICD-10-CM | POA: Diagnosis present

## 2020-06-02 DIAGNOSIS — Z90711 Acquired absence of uterus with remaining cervical stump: Secondary | ICD-10-CM

## 2020-06-02 DIAGNOSIS — M8610 Other acute osteomyelitis, unspecified site: Secondary | ICD-10-CM | POA: Diagnosis not present

## 2020-06-02 DIAGNOSIS — R188 Other ascites: Secondary | ICD-10-CM | POA: Diagnosis present

## 2020-06-02 DIAGNOSIS — M86172 Other acute osteomyelitis, left ankle and foot: Secondary | ICD-10-CM

## 2020-06-02 DIAGNOSIS — Z794 Long term (current) use of insulin: Secondary | ICD-10-CM

## 2020-06-02 DIAGNOSIS — E669 Obesity, unspecified: Secondary | ICD-10-CM | POA: Diagnosis present

## 2020-06-02 DIAGNOSIS — Z170 Estrogen receptor positive status [ER+]: Secondary | ICD-10-CM | POA: Diagnosis not present

## 2020-06-02 DIAGNOSIS — Z515 Encounter for palliative care: Secondary | ICD-10-CM | POA: Diagnosis not present

## 2020-06-02 DIAGNOSIS — Z79899 Other long term (current) drug therapy: Secondary | ICD-10-CM

## 2020-06-02 DIAGNOSIS — Z888 Allergy status to other drugs, medicaments and biological substances status: Secondary | ICD-10-CM

## 2020-06-02 DIAGNOSIS — J918 Pleural effusion in other conditions classified elsewhere: Secondary | ICD-10-CM | POA: Diagnosis not present

## 2020-06-02 DIAGNOSIS — M6281 Muscle weakness (generalized): Secondary | ICD-10-CM | POA: Diagnosis not present

## 2020-06-02 DIAGNOSIS — C7981 Secondary malignant neoplasm of breast: Secondary | ICD-10-CM | POA: Diagnosis not present

## 2020-06-02 DIAGNOSIS — I5032 Chronic diastolic (congestive) heart failure: Secondary | ICD-10-CM | POA: Diagnosis not present

## 2020-06-02 DIAGNOSIS — J90 Pleural effusion, not elsewhere classified: Secondary | ICD-10-CM

## 2020-06-02 DIAGNOSIS — Z89422 Acquired absence of other left toe(s): Secondary | ICD-10-CM

## 2020-06-02 DIAGNOSIS — Z8614 Personal history of Methicillin resistant Staphylococcus aureus infection: Secondary | ICD-10-CM

## 2020-06-02 DIAGNOSIS — J910 Malignant pleural effusion: Secondary | ICD-10-CM | POA: Diagnosis not present

## 2020-06-02 DIAGNOSIS — C7801 Secondary malignant neoplasm of right lung: Secondary | ICD-10-CM | POA: Diagnosis not present

## 2020-06-02 DIAGNOSIS — C782 Secondary malignant neoplasm of pleura: Secondary | ICD-10-CM | POA: Diagnosis not present

## 2020-06-02 DIAGNOSIS — C7951 Secondary malignant neoplasm of bone: Secondary | ICD-10-CM | POA: Diagnosis present

## 2020-06-02 DIAGNOSIS — I10 Essential (primary) hypertension: Secondary | ICD-10-CM | POA: Diagnosis not present

## 2020-06-02 DIAGNOSIS — F32A Depression, unspecified: Secondary | ICD-10-CM

## 2020-06-02 DIAGNOSIS — M7989 Other specified soft tissue disorders: Secondary | ICD-10-CM | POA: Diagnosis not present

## 2020-06-02 DIAGNOSIS — E785 Hyperlipidemia, unspecified: Secondary | ICD-10-CM | POA: Diagnosis present

## 2020-06-02 DIAGNOSIS — Z79891 Long term (current) use of opiate analgesic: Secondary | ICD-10-CM

## 2020-06-02 DIAGNOSIS — C50912 Malignant neoplasm of unspecified site of left female breast: Secondary | ICD-10-CM

## 2020-06-02 DIAGNOSIS — L97421 Non-pressure chronic ulcer of left heel and midfoot limited to breakdown of skin: Secondary | ICD-10-CM

## 2020-06-02 DIAGNOSIS — Z7984 Long term (current) use of oral hypoglycemic drugs: Secondary | ICD-10-CM

## 2020-06-02 DIAGNOSIS — Z20822 Contact with and (suspected) exposure to covid-19: Secondary | ICD-10-CM | POA: Diagnosis present

## 2020-06-02 DIAGNOSIS — Z803 Family history of malignant neoplasm of breast: Secondary | ICD-10-CM

## 2020-06-02 DIAGNOSIS — Z853 Personal history of malignant neoplasm of breast: Secondary | ICD-10-CM | POA: Diagnosis not present

## 2020-06-02 DIAGNOSIS — Z6834 Body mass index (BMI) 34.0-34.9, adult: Secondary | ICD-10-CM

## 2020-06-02 DIAGNOSIS — F419 Anxiety disorder, unspecified: Secondary | ICD-10-CM | POA: Diagnosis not present

## 2020-06-02 DIAGNOSIS — E1142 Type 2 diabetes mellitus with diabetic polyneuropathy: Secondary | ICD-10-CM | POA: Diagnosis present

## 2020-06-02 DIAGNOSIS — I509 Heart failure, unspecified: Secondary | ICD-10-CM | POA: Diagnosis not present

## 2020-06-02 DIAGNOSIS — Z91013 Allergy to seafood: Secondary | ICD-10-CM

## 2020-06-02 DIAGNOSIS — C7802 Secondary malignant neoplasm of left lung: Secondary | ICD-10-CM | POA: Diagnosis not present

## 2020-06-02 DIAGNOSIS — C50212 Malignant neoplasm of upper-inner quadrant of left female breast: Secondary | ICD-10-CM | POA: Diagnosis not present

## 2020-06-02 DIAGNOSIS — R0602 Shortness of breath: Secondary | ICD-10-CM

## 2020-06-02 DIAGNOSIS — K219 Gastro-esophageal reflux disease without esophagitis: Secondary | ICD-10-CM | POA: Diagnosis present

## 2020-06-02 DIAGNOSIS — Z7951 Long term (current) use of inhaled steroids: Secondary | ICD-10-CM

## 2020-06-02 DIAGNOSIS — Z833 Family history of diabetes mellitus: Secondary | ICD-10-CM

## 2020-06-02 DIAGNOSIS — Z8673 Personal history of transient ischemic attack (TIA), and cerebral infarction without residual deficits: Secondary | ICD-10-CM | POA: Diagnosis not present

## 2020-06-02 DIAGNOSIS — I5033 Acute on chronic diastolic (congestive) heart failure: Secondary | ICD-10-CM | POA: Insufficient documentation

## 2020-06-02 LAB — URINALYSIS, COMPLETE (UACMP) WITH MICROSCOPIC
Bacteria, UA: NONE SEEN
Bilirubin Urine: NEGATIVE
Glucose, UA: 50 mg/dL — AB
Hgb urine dipstick: NEGATIVE
Ketones, ur: NEGATIVE mg/dL
Leukocytes,Ua: NEGATIVE
Nitrite: NEGATIVE
Protein, ur: 100 mg/dL — AB
Specific Gravity, Urine: 1.01 (ref 1.005–1.030)
pH: 6 (ref 5.0–8.0)

## 2020-06-02 LAB — RESPIRATORY PANEL BY RT PCR (FLU A&B, COVID)
Influenza A by PCR: NEGATIVE
Influenza B by PCR: NEGATIVE
SARS Coronavirus 2 by RT PCR: NEGATIVE

## 2020-06-02 LAB — CBG MONITORING, ED
Glucose-Capillary: 132 mg/dL — ABNORMAL HIGH (ref 70–99)
Glucose-Capillary: 101 mg/dL — ABNORMAL HIGH (ref 70–99)

## 2020-06-02 LAB — CBC
HCT: 24.4 % — ABNORMAL LOW (ref 36.0–46.0)
Hemoglobin: 7.9 g/dL — ABNORMAL LOW (ref 12.0–15.0)
MCH: 29.2 pg (ref 26.0–34.0)
MCHC: 32.4 g/dL (ref 30.0–36.0)
MCV: 90 fL (ref 80.0–100.0)
Platelets: 596 10*3/uL — ABNORMAL HIGH (ref 150–400)
RBC: 2.71 MIL/uL — ABNORMAL LOW (ref 3.87–5.11)
RDW: 15.8 % — ABNORMAL HIGH (ref 11.5–15.5)
WBC: 20.5 10*3/uL — ABNORMAL HIGH (ref 4.0–10.5)
nRBC: 0 % (ref 0.0–0.2)

## 2020-06-02 LAB — BASIC METABOLIC PANEL
Anion gap: 13 (ref 5–15)
BUN: 50 mg/dL — ABNORMAL HIGH (ref 8–23)
CO2: 25 mmol/L (ref 22–32)
Calcium: 8.5 mg/dL — ABNORMAL LOW (ref 8.9–10.3)
Chloride: 98 mmol/L (ref 98–111)
Creatinine, Ser: 3.17 mg/dL — ABNORMAL HIGH (ref 0.44–1.00)
GFR, Estimated: 16 mL/min — ABNORMAL LOW (ref >60)
Glucose, Bld: 267 mg/dL — ABNORMAL HIGH (ref 70–99)
Potassium: 4.5 mmol/L (ref 3.5–5.1)
Sodium: 136 mmol/L (ref 135–145)

## 2020-06-02 LAB — PROCALCITONIN: Procalcitonin: 0.47 ng/mL

## 2020-06-02 LAB — BRAIN NATRIURETIC PEPTIDE: B Natriuretic Peptide: 336.9 pg/mL — ABNORMAL HIGH (ref 0.0–100.0)

## 2020-06-02 MED ORDER — FAMOTIDINE 20 MG PO TABS
20.0000 mg | ORAL_TABLET | Freq: Two times a day (BID) | ORAL | Status: DC
  Administered 2020-06-03 – 2020-06-04 (×3): 20 mg via ORAL
  Filled 2020-06-02 (×5): qty 1

## 2020-06-02 MED ORDER — FLUOXETINE HCL 20 MG PO CAPS
20.0000 mg | ORAL_CAPSULE | Freq: Two times a day (BID) | ORAL | Status: DC
  Administered 2020-06-03 – 2020-06-06 (×8): 20 mg via ORAL
  Filled 2020-06-02 (×10): qty 1

## 2020-06-02 MED ORDER — BUMETANIDE 0.5 MG PO TABS
0.5000 mg | ORAL_TABLET | Freq: Two times a day (BID) | ORAL | Status: DC
  Administered 2020-06-03 – 2020-06-06 (×7): 0.5 mg via ORAL
  Filled 2020-06-02 (×9): qty 1

## 2020-06-02 MED ORDER — ALBUTEROL SULFATE HFA 108 (90 BASE) MCG/ACT IN AERS
2.0000 | INHALATION_SPRAY | Freq: Four times a day (QID) | RESPIRATORY_TRACT | Status: DC | PRN
  Filled 2020-06-02: qty 6.7

## 2020-06-02 MED ORDER — AMLODIPINE BESYLATE 10 MG PO TABS
10.0000 mg | ORAL_TABLET | Freq: Every day | ORAL | Status: DC
  Administered 2020-06-03 – 2020-06-06 (×4): 10 mg via ORAL
  Filled 2020-06-02: qty 2
  Filled 2020-06-02 (×3): qty 1

## 2020-06-02 MED ORDER — SODIUM CHLORIDE 0.9 % IV SOLN: 1.0000 g | INTRAVENOUS | Status: DC

## 2020-06-02 MED ORDER — FERROUS SULFATE 325 (65 FE) MG PO TABS
325.0000 mg | ORAL_TABLET | Freq: Every day | ORAL | Status: DC
  Administered 2020-06-03 – 2020-06-06 (×4): 325 mg via ORAL
  Filled 2020-06-02 (×4): qty 1

## 2020-06-02 MED ORDER — FUROSEMIDE 10 MG/ML IJ SOLN
60.0000 mg | Freq: Once | INTRAMUSCULAR | Status: AC
  Administered 2020-06-02: 60 mg via INTRAVENOUS
  Filled 2020-06-02: qty 8

## 2020-06-02 MED ORDER — ONDANSETRON HCL 4 MG/2ML IJ SOLN
4.0000 mg | Freq: Four times a day (QID) | INTRAMUSCULAR | Status: DC | PRN
  Administered 2020-06-03: 4 mg via INTRAVENOUS
  Filled 2020-06-02: qty 2

## 2020-06-02 MED ORDER — SODIUM CHLORIDE 0.9% FLUSH
3.0000 mL | Freq: Two times a day (BID) | INTRAVENOUS | Status: DC
  Administered 2020-06-03 – 2020-06-06 (×7): 3 mL via INTRAVENOUS

## 2020-06-02 MED ORDER — CALCITRIOL 0.25 MCG PO CAPS
0.2500 ug | ORAL_CAPSULE | Freq: Every day | ORAL | Status: DC
  Administered 2020-06-03 – 2020-06-06 (×4): 0.25 ug via ORAL
  Filled 2020-06-02 (×4): qty 1

## 2020-06-02 MED ORDER — SODIUM CHLORIDE 0.9 % IV SOLN: 250.0000 mL | INTRAVENOUS | Status: DC | PRN

## 2020-06-02 MED ORDER — ENALAPRIL MALEATE 10 MG PO TABS: 20.0000 mg | ORAL_TABLET | Freq: Two times a day (BID) | ORAL | Status: DC

## 2020-06-02 MED ORDER — BISACODYL 10 MG RE SUPP
10.0000 mg | Freq: Every day | RECTAL | Status: DC | PRN
  Filled 2020-06-02: qty 1

## 2020-06-02 MED ORDER — ONDANSETRON HCL 4 MG PO TABS: 4.0000 mg | ORAL_TABLET | Freq: Four times a day (QID) | ORAL | Status: DC | PRN

## 2020-06-02 MED ORDER — SODIUM CHLORIDE 0.9% FLUSH: 3.0000 mL | INTRAVENOUS | Status: DC | PRN

## 2020-06-02 MED ORDER — INSULIN ASPART 100 UNIT/ML ~~LOC~~ SOLN
0.0000 [IU] | Freq: Three times a day (TID) | SUBCUTANEOUS | Status: DC
  Administered 2020-06-02 – 2020-06-03 (×2): 2 [IU] via SUBCUTANEOUS
  Administered 2020-06-03 – 2020-06-04 (×2): 3 [IU] via SUBCUTANEOUS
  Administered 2020-06-04: 2 [IU] via SUBCUTANEOUS
  Administered 2020-06-04: 5 [IU] via SUBCUTANEOUS
  Administered 2020-06-05: 2 [IU] via SUBCUTANEOUS
  Administered 2020-06-05: 3 [IU] via SUBCUTANEOUS
  Administered 2020-06-06: 11 [IU] via SUBCUTANEOUS
  Administered 2020-06-06: 5 [IU] via SUBCUTANEOUS
  Filled 2020-06-02 (×10): qty 1

## 2020-06-02 MED ORDER — CLONIDINE HCL 0.1 MG PO TABS
0.2000 mg | ORAL_TABLET | Freq: Two times a day (BID) | ORAL | Status: DC
  Administered 2020-06-03 – 2020-06-06 (×8): 0.2 mg via ORAL
  Filled 2020-06-02 (×9): qty 2

## 2020-06-02 MED ORDER — DOCUSATE SODIUM 100 MG PO CAPS
100.0000 mg | ORAL_CAPSULE | Freq: Every day | ORAL | Status: DC
  Administered 2020-06-03 – 2020-06-06 (×4): 100 mg via ORAL
  Filled 2020-06-02 (×4): qty 1

## 2020-06-02 MED ORDER — SODIUM CHLORIDE 0.9 % IV SOLN
2.0000 g | INTRAVENOUS | Status: DC
  Administered 2020-06-02: 2 g via INTRAVENOUS
  Filled 2020-06-02 (×2): qty 20

## 2020-06-02 MED ORDER — ALPRAZOLAM 0.5 MG PO TABS: 0.5000 mg | ORAL_TABLET | Freq: Two times a day (BID) | ORAL | Status: DC | PRN

## 2020-06-02 MED ORDER — FLUTICASONE PROPIONATE (INHAL) 100 MCG/BLIST IN AEPB: 2.0000 | INHALATION_SPRAY | RESPIRATORY_TRACT | Status: DC

## 2020-06-02 MED ORDER — SENNA 8.6 MG PO TABS: 1.0000 | ORAL_TABLET | Freq: Two times a day (BID) | ORAL | Status: DC | PRN

## 2020-06-02 MED ORDER — OXYCODONE-ACETAMINOPHEN 5-325 MG PO TABS
1.0000 | ORAL_TABLET | ORAL | Status: DC | PRN
  Administered 2020-06-03 – 2020-06-05 (×2): 1 via ORAL
  Filled 2020-06-02 (×2): qty 1

## 2020-06-02 MED ORDER — ATENOLOL 50 MG PO TABS
50.0000 mg | ORAL_TABLET | Freq: Every day | ORAL | Status: DC
  Administered 2020-06-03 – 2020-06-06 (×4): 50 mg via ORAL
  Filled 2020-06-02 (×3): qty 1
  Filled 2020-06-02: qty 2

## 2020-06-02 MED ORDER — FUROSEMIDE 10 MG/ML IJ SOLN: 60.0000 mg | Freq: Once | INTRAMUSCULAR | Status: DC

## 2020-06-02 MED ORDER — GABAPENTIN 100 MG PO CAPS
100.0000 mg | ORAL_CAPSULE | Freq: Every day | ORAL | Status: DC
  Administered 2020-06-03 – 2020-06-05 (×4): 100 mg via ORAL
  Filled 2020-06-02 (×5): qty 1

## 2020-06-02 MED ORDER — SIMVASTATIN 20 MG PO TABS
20.0000 mg | ORAL_TABLET | Freq: Every day | ORAL | Status: DC
  Administered 2020-06-03 – 2020-06-05 (×4): 20 mg via ORAL
  Filled 2020-06-02 (×2): qty 1
  Filled 2020-06-02 (×2): qty 2
  Filled 2020-06-02 (×2): qty 1

## 2020-06-02 MED ORDER — VANCOMYCIN HCL 1750 MG/350ML IV SOLN
1750.0000 mg | Freq: Once | INTRAVENOUS | Status: AC
  Administered 2020-06-02: 1750 mg via INTRAVENOUS
  Filled 2020-06-02: qty 350

## 2020-06-02 MED ORDER — VANCOMYCIN HCL IN DEXTROSE 1-5 GM/200ML-% IV SOLN: 1000.0000 mg | INTRAVENOUS | Status: DC

## 2020-06-02 NOTE — ED Notes (Signed)
CBG 101

## 2020-06-02 NOTE — ED Provider Notes (Signed)
Per hemonc-No steroids or neulasta.   Wbc's are spontaneous likely due to infection.    Nena Polio, MD 06/02/20 [phone]

## 2020-06-02 NOTE — Progress Notes (Signed)
Pharmacy Antibiotic Note  [name] is a 64 y.o. female admitted on 06/02/2020 with shortness of breath and chronic L foot wound (with history of osteomyelitis).  Pharmacy has been consulted for vancomycin dosing.  Plan: Vancomycin 1750mg  IV x 1  Then start vancomycin 1000mg  IV q48h Monitor clinical picture, renal function, vanc levels at Css if needed F/U C&S, abx de-escalation, LOT   Height: 5\' 2"  (157.5 cm) Weight: 86.6 kg (191 lb) IBW/kg (Calculated) : 50.1  Temp (24hrs), Avg:98.1 F (36.7 C), Min:98.1 F (36.7 C), Max:98.1 F (36.7 C)  Recent Labs  Lab 05/28/20 0829 06/02/20 1048  WBC 16.7* 20.5*  CREATININE 2.84* 3.17*    Estimated Creatinine Clearance: 18.6 mL/min (A) (by C-G formula based on SCr of 3.17 mg/dL (H)).    Allergies  Allergen Reactions  . Fish-Derived Products Anaphylaxis  . Chlorhexidine   . Sulfamethoxazole-Trimethoprim Other (See Comments)    Caused kidney issues    Antimicrobials this admission: vanc 10/30>   Microbiology results: None collected   Thank you for allowing pharmacy to be a part of this patient's care.  Mills Koller 06/02/2020 4:26 PM

## 2020-06-02 NOTE — ED Notes (Signed)
Applied sacrum dressing at request of RN. No broken skin.

## 2020-06-02 NOTE — ED Triage Notes (Signed)
Pt via pov from home with sob x several days. Pt states she was treated for "fluid in my lungs" and the sob "never really went away." Pt states she has been using her inhaler with little relief. Pt has heart failure and metastatic breast cancer.

## 2020-06-02 NOTE — H&P (Signed)
History and Physical    [name] IOX:[id number] DOB: 19-Jul-1956 DOA: 06/02/2020  PCP: [name], MD   Patient coming from: Home  I have personally briefly reviewed patient's old medical records in [place]  Chief Complaint: Shortness of breath  HPI: [name] is a 64 y.o. female with medical history significant for stage IV breast cancer on chemotherapy, stage IV chronic kidney disease, depression, hypertension, GERD who presents to the emergency room for evaluation of shortness of breath mostly with exertion which she has had for about a week.  Patient states that her symptoms have continued to worsen which is why she presented to the emergency room.  Shortness of breath is associated with orthopnea but she has chronic lower extremity swelling.  She denies having any chest pain, no nausea, no vomiting, no abdominal pain, no cough or any urinary symptoms. At rest patient was noted to have room air pulse oximetry of 92% but with conversation pulse oximetry dropped to 89% and improved to the upper 90s on 2 L Patient is status post transfusion of 1 unit of packed RBC on 10/18 and received chemotherapy on 05/28/20. 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  Labs show sodium 136, potassium 4.5, chloride 98, bicarb 25, glucose 267, BUN 50, creatinine 3.7, calcium 8.5, BNP 336, procalcitonin 0.47, white count 20.5, hemoglobin 7.9, hematocrit 24.4, MCV 90, RDW 15.8, platelet count 598 Respiratory viral panel is negative CT scan of the chest without contrast shows bilateral malignant pleural effusions which are only slightly larger in comparison PET/CT with associated evidence of visceral, parietal pleural metastasis.  Redemonstration of skeletal mets with new/enlarging lytic lesions of the thoracic spine compatible with progression.  The left breast mass appears to be enlarged compared to the prior PET.   Twelve-lead EKG reviewed by me shows normal sinus rhythm   ED Course: Patient is a 64 year old African-American female with a history of metastatic breast cancer who presents to the ER for evaluation of worsening shortness of breath compared to her baseline associated with orthopnea.  CT scan of the chest shows bilateral malignant pleural effusions with diffuse metastatic disease.  Patient will be admitted to the hospital for further evaluation.     Review of Systems: As per HPI otherwise 10 point review of systems negative.    Past Medical History:  Diagnosis Date  . Anemia   . Anxiety   . Asthma   . Cancer ([place]) 03/10/2018   Per [facility] PET order. Carcinoma of upper-inner quadrant of left breast in female, estrogen receptor positive .  Marland Kitchen Cancer (HCC)    LUNG  . CHF (congestive heart failure) ([place]) 1997  . CKD (chronic kidney disease)   . Depression   . Diabetes mellitus, type 2 ([place])   . Family history of breast cancer   . Family history of colon cancer   . Family history of ovarian cancer   . Family history of pancreatic cancer   . Family history of prostate cancer   . Family history of stomach cancer   . GERD (gastroesophageal reflux disease)    history of an ulcer  . Hair loss   . History of left breast cancer 05/29/14  . History of partial hysterectomy 12/31/2016   Per patient.  Has not had a period in years.  Had a partial hysterectomy years ago.  Marland Kitchen Hypertension   . Mitral valve regurgitation   .  Neuromuscular disorder (HCC)    neuropathies in hand  . Obesity   . Pancreatitis 1997  . Stroke [facility]) 2010   with mild left arm weakness    Past Surgical History:  Procedure Laterality Date  . AMPUTATION Left 03/30/2020   Procedure: AMPUTATION 5th RAY;  Surgeon: Samara Deist, DPM;  Location: [facility] ORS;  Service: Podiatry;  Laterality: Left;  . APPLICATION OF WOUND VAC Left 03/30/2020   Procedure: APPLICATION OF WOUND VAC;  Surgeon: Samara Deist, DPM;  Location: [facility]  ORS;  Service: Podiatry;  Laterality: Left;  . CATARACT EXTRACTION W/PHACO Right 02/24/2019   Procedure: CATARACT EXTRACTION PHACO AND INTRAOCULAR LENS PLACEMENT ([place]) RIGHT DIABETES;  Surgeon: Marchia Meiers, MD;  Location: [facility] ORS;  Service: Ophthalmology;  Laterality: Right;  [place] 01:13.0 CDE 7.96 Fluid Pack Lot # [id number]  . CATARACT EXTRACTION W/PHACO Left 03/24/2019   Procedure: CATARACT EXTRACTION PHACO AND INTRAOCULAR LENS PLACEMENT ([facility]) - left diabetic;  Surgeon: Marchia Meiers, MD;  Location: [facility] ORS;  Service: Ophthalmology;  Laterality: Left;  [place]  01:36 CDE 13.93 Fluid pack lot # [id number]  . CENTRAL LINE INSERTION-TUNNELED N/A 04/04/2020   Procedure: CENTRAL LINE INSERTION-TUNNELED;  Surgeon: Katha Cabal, MD;  Location: [place] CV LAB;  Service: Cardiovascular;  Laterality: N/A;  . CESAREAN SECTION    . CHOLECYSTECTOMY    . DIALYSIS/PERMA CATHETER REMOVAL N/A 05/01/2020   Procedure: DIALYSIS/PERMA CATHETER REMOVAL;  Surgeon: Katha Cabal, MD;  Location: [place] CV LAB;  Service: Cardiovascular;  Laterality: N/A;  . EXCISION OF TONGUE LESION N/A 08/17/2018   Procedure: EXCISION OF TONGUE LESION WITH FROZEN SECTIONS;  Surgeon: Beverly Gust, MD;  Location: [facility] ORS;  Service: ENT;  Laterality: N/A;  . EYE SURGERY Right    cataract extraction  . IRRIGATION AND DEBRIDEMENT FOOT Left 03/30/2020   Procedure: IRRIGATION AND DEBRIDEMENT FOOT;  Surgeon: Samara Deist, DPM;  Location: [facility] ORS;  Service: Podiatry;  Laterality: Left;  . PARTIAL HYSTERECTOMY  12/31/2016   Per patient, she has not had a period in years since she had a partial hysterectomy.  Marland Kitchen PORTA CATH INSERTION    . TUBAL LIGATION       reports that she has quit smoking. Her smoking use included cigarettes. She has a 0.50 pack-year smoking history. She has never used smokeless tobacco. She reports that she does not drink alcohol and does not use drugs.  Allergies  Allergen Reactions  .  Fish-Derived Products Anaphylaxis  . Chlorhexidine   . Sulfamethoxazole-Trimethoprim Other (See Comments)    Caused kidney issues    Family History  Problem Relation Age of Onset  . Ovarian cancer Mother 5  . Diabetes Mother   . Hypertension Mother   . COPD Father   . Hypertension Father   . Colon cancer Father 1  . Diabetes Sister   . Breast cancer Sister 35       bilateral  . Diabetes Brother   . Leukemia Maternal Aunt   . Pancreatic cancer Paternal Aunt 77  . Pancreatic cancer Paternal Uncle   . Colon cancer Paternal Uncle   . Stomach cancer Maternal Grandfather 33  . Throat cancer Paternal Grandmother   . Breast cancer Maternal Aunt 80  . Colon cancer Maternal Aunt   . Bone cancer Maternal Aunt   . Breast cancer Paternal Aunt        dx >50  . Prostate cancer Paternal Uncle   . Pancreatic cancer Paternal Uncle   . Throat cancer  Paternal Uncle   . Lung cancer Paternal Uncle   . Stomach cancer Paternal Uncle   . Brain cancer Paternal Aunt   . Cancer Cousin        liver, kidney  . Prostate cancer Cousin        meastatic  . Lung cancer Other      Prior to Admission medications   Medication Sig Start Date End Date Taking? Authorizing Provider  acetaminophen-codeine (TYLENOL #4) 300-60 MG tablet Take 1 tablet by mouth in the morning and at bedtime.     [provider]  albuterol (PROAIR HFA) 108 (90 BASE) MCG/ACT inhaler Inhale 2 puffs into the lungs every 6 (six) hours as needed for wheezing or shortness of breath.  06/20/14   [provider]  ALPRAZolam Duanne Moron) 0.5 MG tablet Take 0.5 mg by mouth 2 (two) times daily as needed for anxiety or sleep.     [provider]  amLODipine (NORVASC) 10 MG tablet Take 10 mg by mouth daily.  06/21/14   [provider]  aspirin EC 81 MG tablet Take 81 mg by mouth daily.  06/21/14   [provider]  atenolol (TENORMIN) 50 MG tablet Take 50 mg by mouth 2 (two) times daily.  04/07/14    [provider]  bumetanide (BUMEX) 2 MG tablet Take 0.5 mg by mouth 2 (two) times daily.  08/22/14   [provider]  calcitRIOL (ROCALTROL) 0.25 MCG capsule Take 0.25 mcg by mouth daily.  09/21/16   [provider]  Cinnamon 500 MG capsule Take 500 mg by mouth 2 (two) times daily.     [provider]  cloNIDine (CATAPRES) 0.2 MG tablet Take 0.2 mg by mouth 2 (two) times daily.  06/21/14   [provider]  docusate sodium (COLACE) 100 MG capsule Take 1 capsule (100 mg total) by mouth 2 (two) times daily. Patient taking differently: Take 100 mg by mouth daily.  04/06/20   Enzo Bi, MD  enalapril (VASOTEC) 10 MG tablet Take 20 mg by mouth 2 (two) times a day.  01/12/19   [provider]  famotidine (PEPCID) 20 MG tablet Take 20 mg by mouth 2 (two) times daily.  09/14/19   [provider]  ferrous sulfate 325 (65 FE) MG tablet Take 325 mg by mouth daily with breakfast.    [provider]  FLUoxetine (PROZAC) 20 MG capsule Take 20 mg by mouth 2 (two) times daily.  06/21/14   [provider]  Fluticasone Propionate, Inhal, (FLOVENT DISKUS) 100 MCG/BLIST AEPB Inhale 2 puffs into the lungs in the morning and at bedtime.    [provider]  gabapentin (NEURONTIN) 100 MG capsule TAKE 1 CAPSULE(100 MG) BY MOUTH AT BEDTIME Patient taking differently: Take 100 mg by mouth at bedtime.  11/04/19   Cammie Sickle, MD  glyBURIDE (DIABETA) 5 MG tablet Take 10 mg by mouth 2 (two) times daily with a meal.  06/20/14   [provider]  LEVEMIR FLEXTOUCH 100 UNIT/ML FlexPen Inject 25 Units into the skin daily. 04/06/20   Enzo Bi, MD  NOVOLOG FLEXPEN 100 UNIT/ML FlexPen Inject 5 Units into the skin 3 (three) times daily with meals. Patient taking differently: Inject 7 Units into the skin 2 (two) times daily with a meal.  04/06/20   Enzo Bi, MD  oxyCODONE-acetaminophen (PERCOCET/ROXICET) 5-325 MG tablet Take by mouth.  05/17/20 06/16/20  [provider]  senna (SENOKOT) 8.6 MG TABS tablet Take 1  tablet (8.6 mg total) by mouth 2 (two) times daily as needed for mild constipation or moderate constipation. 04/06/20   Enzo Bi, MD  simvastatin (ZOCOR) 20 MG tablet Take 20 mg by mouth every evening.     [provider]    Physical Exam: Vitals:   06/02/20 1040 06/02/20 1041 06/02/20 1043  BP:  125/69   Pulse:  84   Resp:  18   Temp:  98.1 F (36.7 C)   TempSrc:  Oral   SpO2: 92% 92%   Weight:   86.6 kg  Height:   5\' 2"  (1.575 m)     Vitals:   06/02/20 1040 06/02/20 1041 06/02/20 1043  BP:  125/69   Pulse:  84   Resp:  18   Temp:  98.1 F (36.7 C)   TempSrc:  Oral   SpO2: 92% 92%   Weight:   86.6 kg  Height:   5\' 2"  (1.575 m)    Constitutional: NAD, alert and oriented x 3.  Chronically ill-appearing.  Looks older than stated age Eyes: PERRL, lids and conjunctivae pallor ENMT: Mucous membranes are dry.  Neck: normal, supple, no masses, no thyromegaly Respiratory: Decreased air entry at the bases bilaterally, no wheezing,  Normal respiratory effort. No accessory muscle use.  Cardiovascular: Regular rate and rhythm, no murmurs / rubs / gallops.  Chronic lower extremity edema. 2+ pedal pulses. No carotid bruits.  Abdomen: no tenderness, no masses palpated. No hepatosplenomegaly. Bowel sounds positive.  Musculoskeletal: no clubbing / cyanosis. No joint deformity upper and lower extremities.  Skin: no rashes, lesions, ulcers.  Neurologic: No gross focal neurologic deficit Psychiatric: Normal mood and affect.   Labs on Admission: I have personally reviewed following labs and imaging studies  CBC: Recent Labs  Lab 05/28/20 0829 06/02/20 1048  WBC 16.7* 20.5*  NEUTROABS 9.5*  --   HGB 8.8* 7.9*  HCT 26.9* 24.4*  MCV 88.8 90.0  PLT 883* 500*   Basic Metabolic Panel: Recent Labs  Lab 05/28/20 0829 06/02/20 1048  NA 137 136  K 4.6 4.5  CL 102 98  CO2 28 25  GLUCOSE  67* 267*  BUN 37* 50*  CREATININE 2.84* 3.17*  CALCIUM 8.5* 8.5*   GFR: Estimated Creatinine Clearance: 18.6 mL/min (A) (by C-G formula based on SCr of 3.17 mg/dL (H)). Liver Function Tests: Recent Labs  Lab 05/28/20 0829  AST 14*  ALT 9  ALKPHOS 71  BILITOT 0.5  PROT 6.9  ALBUMIN 2.6*   No results for input(s): LIPASE, AMYLASE in the last 168 hours. No results for input(s): AMMONIA in the last 168 hours. Coagulation Profile: No results for input(s): INR, PROTIME in the last 168 hours. Cardiac Enzymes: No results for input(s): CKTOTAL, CKMB, CKMBINDEX, TROPONINI in the last 168 hours. BNP (last 3 results) No results for input(s): PROBNP in the last 8760 hours. HbA1C: No results for input(s): HGBA1C in the last 72 hours. CBG: No results for input(s): GLUCAP in the last 168 hours. Lipid Profile: No results for input(s): CHOL, HDL, LDLCALC, TRIG, CHOLHDL, LDLDIRECT in the last 72 hours. Thyroid Function Tests: No results for input(s): TSH, T4TOTAL, FREET4, T3FREE, THYROIDAB in the last 72 hours. Anemia Panel: No results for input(s): VITAMINB12, FOLATE, FERRITIN, TIBC, IRON, RETICCTPCT in the last 72 hours. Urine analysis:    Component Value Date/Time   COLORURINE YELLOW (A) 03/01/2020 1850   APPEARANCEUR HAZY (A) 03/01/2020 1850   LABSPEC 1.009 03/01/2020 1850   PHURINE 5.0 03/01/2020  [place] >=500 (A) 03/01/2020 1850   HGBUR SMALL (A) 03/01/2020 1850   BILIRUBINUR NEGATIVE 03/01/2020 [place] 03/01/2020 1850   PROTEINUR 100 (A) 03/01/2020 1850   NITRITE NEGATIVE 03/01/2020 1850   LEUKOCYTESUR LARGE (A) 03/01/2020 1850    Radiological Exams on Admission: DG Chest 2 View  Result Date: 06/02/2020 CLINICAL DATA:  Shortness of breath EXAM: CHEST - 2 VIEW COMPARISON:  March 29, 2020 FINDINGS: Stable right Port-A-Cath. No pneumothorax. Bilateral pleural effusions with underlying opacities are similar in the interval. Mild interstitial  prominence. No other acute abnormalities or changes. IMPRESSION: 1. Stable bilateral pleural effusions with underlying opacities. 2. Possible mild pulmonary edema/pulmonary venous congestion. Electronically Signed   By: Dorise Bullion III M.D   On: 06/02/2020 11:29   CT Chest Wo Contrast  Result Date: 06/02/2020 CLINICAL DATA:  64 year old female with a history of shortness of breath, metastatic breast cancer EXAM: CT CHEST WITHOUT CONTRAST TECHNIQUE: Multidetector CT imaging of the chest was performed following the standard protocol without IV contrast. COMPARISON:  PET-CT 03/28/2020 FINDINGS: Cardiovascular: Heart size is unchanged. No pericardial fluid. Normal course caliber and contour of the thoracic aorta. No significant atherosclerotic changes. Right IJ port catheter. Mediastinum/Nodes: Unremarkable thyroid/thoracic inlet. Unremarkable thoracic esophagus. The subcarinal node node measures smaller than the comparison PET CT, previously approximately 10 mm, now 7 mm. Otherwise, no enlarged mediastinal lymph nodes. Lungs/Pleura: Bilateral pleural effusions, predominantly in the dependent aspects of the lungs. There is rim of hyperdensity adjacent to the low-density fluid at the costophrenic sulci and extending to the medial, posterior, and lateral pleural/fluid interface of the bilateral lungs. The fluid extends along the lateral lung to the anterior lung on the left, with small volume of partially trapped fluid. There is hyperdense tissue thickening along the pleural mediastinal interface of the upper mediastinum, more pronounced on the left. The appearance of the nodular thickening is relatively similar to the PET CT dated 03/28/2020. Overall, the volume of fluid appears to have slightly progressed. Nodular lung changes at the periphery of the left upper lobe on image 45 of series 3, left lower lobe on image 86 of series 2, and at the lower right lung, image 75 of series 2 and image 92 of series 2. No  evidence of interlobular septal thickening. There is no confluent airspace disease. No endotracheal or endobronchial debris. Upper Abdomen: No acute finding of the upper abdomen. Musculoskeletal: Redemonstration of spiculated soft tissue nodule of the left breast, compatible with the given history of breast cancer. The more inferior aspect of the mass measures slightly larger in AP dimension on the axial CT, previously 27 mm, now 30 mm. Although sclerotic foci within the skeleton are relatively unchanged, there are new/enlarging lytic lesions of the thoracic spine including: T5 on image 48 of series 2, with new lytic lesion adjacent to the sclerotic focus T6 at the anterior vertebral body, enlarging from the prior T7 at the right vertebral body, mixed lytic and sclerotic, enlarging from the prior T10 at the anterior vertebral body, enlarging T11, posterior vertebral body, enlarging IMPRESSION: Bilateral malignant pleural effusions, which are only slightly larger than the comparison PET CT of 03/28/2020, with associated evidence of visceral/parietal pleural metastases. Redemonstration skeletal metastases, with new/enlarging lytic lesions of the thoracic spine compatible with progression. The left breast mass appears to be enlarging compared to the prior PET. Electronically Signed   By: Corrie Mckusick D.O.   On: 06/02/2020 15:35    EKG:  Independently reviewed.  Normal sinus rhythm  Assessment/Plan Principal Problem:   Malignant pleural effusion Active Problems:   Benign essential HTN   Anxiety and depression   Type 2 diabetes mellitus with diabetic chronic kidney disease (HCC)   Metastasis from malignant tumor of breast (HCC)   Antineoplastic chemotherapy induced anemia   Diabetic foot ulcer (HCC)     Malignant pleural effusion Secondary to known metastatic breast cancer Patient presents for evaluation of exertional shortness of breath and at rest has room air pulse oximetry of 92% which dropped to  89% with conversation Pulse oximetry improved following oxygen supplementation at 2 L We will consult IR for thoracentesis for symptom control Patient may need pleurodesis to prevent reaccumulation of pleural fluid   Metastatic breast cancer Patient with known stage IV breast cancer CT scan of the chest without contrast shows bilateral malignant pleural effusions, which are only slightly larger than the comparison PET CT of 03/28/2020, with associated evidence of visceral/parietal pleural metastases. Re demonstration of skeletal metastases, with new/enlarging lytic lesions of the thoracic spine compatible with progression. The left breast mass appears to be enlarging compared to the prior PET. We will request oncology consult    Diabetes mellitus with complications of stage IV chronic kidney disease Maintain consistent carbohydrate diet Sliding scale insulin for glycemic control Consult nephrology    Antineoplastic induced anemia Secondary to chemotherapy which patient is receiving for metastatic breast cancer Hemoglobin today on admission is 7.9g/dl Patient was transfused 1 unit of packed RBC on 05/21/20 She may require further transfusion during this hospitalization    Hypertension Patient is on multiple antihypertensive medications which will be resumed during this hospitalization   Depression and anxiety Continue alprazolam and fluoxetine    History of diastolic dysfunction CHF Continue Bumex, atenolol and enalapril    Leukocytosis Unclear etiology Concern for an infectious process Awaiting results of UA Patient has a nonhealing left leg wound following a ray amputation of the left fifth toe for osteomyelitis Obtain left foot x-ray Wound cultures at the time yielded E. coli and MRSA We will treat patient empirically with vancomycin and Rocephin We will request podiatry consult     DVT prophylaxis: Lovenox Code Status: Full code Family Communication:  Greater than 50% of time was spent discussing plan of care with patient at the bedside. She verbalizes understanding and agrees with the plan. CODE STATUS was discussed with patient request to be a full code Disposition Plan: Back to previous home environment Consults called: Oncology/podiatry/nephrology/interventional radiology    Collier Bullock MD [facility]     06/02/2020, 4:15 PM

## 2020-06-02 NOTE — ED Provider Notes (Signed)
[facility] Emergency Department Provider Note   ____________________________________________   First MD Initiated Contact with Patient 06/02/20 1328     (approximate)  I have reviewed the triage vital signs and the nursing notes.   HISTORY  Chief Complaint Shortness of Breath    HPI [name] is a 64 y.o. female the history of asthma, breast carcinoma, CHF, chronic kidney disease, mitral valve regurgitation and several other medical issues  Patient reports that for the last several days she has had increasing shortness of breath.  Feels like she cannot catch her breath especially when she tries to exert herself.  She has been under the care of the wound care clinic for chronic wound in her left foot that seems to be healing well.  She is not had any cough or fever.  Symptoms are worsened by laying flat and also with exerting herself.  She recently had a follow-up visit with oncology and reports she had to receive a unit of blood earlier this week  No chest pain.  No headaches.   Past Medical History:  Diagnosis Date  . Anemia   . Anxiety   . Asthma   . Cancer ([place]) 03/10/2018   Per NM PET order. Carcinoma of upper-inner quadrant of left breast in female, estrogen receptor positive .  Marland Kitchen Cancer (HCC)    LUNG  . CHF (congestive heart failure) ([place]) 1997  . CKD (chronic kidney disease)   . Depression   . Diabetes mellitus, type 2 ([place])   . Family history of breast cancer   . Family history of colon cancer   . Family history of ovarian cancer   . Family history of pancreatic cancer   . Family history of prostate cancer   . Family history of stomach cancer   . GERD (gastroesophageal reflux disease)    history of an ulcer  . Hair loss   . History of left breast cancer 05/29/14  . History of partial hysterectomy 12/31/2016   Per patient.  Has not had a period in years.  Had a partial hysterectomy years ago.  Marland Kitchen Hypertension   . Mitral valve  regurgitation   . Neuromuscular disorder (HCC)    neuropathies in hand  . Obesity   . Pancreatitis 1997  . Stroke [facility]) 2010   with mild left arm weakness    Patient Active Problem List   Diagnosis Date Noted  . Acute on chronic diastolic CHF (congestive heart failure) ([place]) 06/02/2020  . Osteomyelitis ([place]) 04/05/2020  . Diabetic foot ulcer ([place]) 04/05/2020  . CKD (chronic kidney disease), stage III ([place]) 04/05/2020  . Sepsis ([place]) 03/29/2020  . Antineoplastic chemotherapy induced anemia 03/02/2020  . Weakness generalized 03/02/2020  . Fall at home 03/02/2020  . Tear of meniscus of knee 11/17/2019  . Lactic acidosis   . AKI (acute kidney injury) ([place]) 09/27/2019  . Metastatic breast cancer ([place]) 09/27/2019  . Hypertension complicating diabetes ([place]) 09/27/2019  . Hyperlipidemia associated with type 2 diabetes mellitus ([place]) 09/27/2019  . Hyperkalemia 09/27/2019  . Taking a statin medication 05/18/2019  . Anemia of chronic disease 04/27/2019  . Benign hypertensive kidney disease with chronic kidney disease 04/27/2019  . Hyposmolality and/or hyponatremia 04/27/2019  . Proteinuria 04/27/2019  . Secondary hyperparathyroidism of renal origin ([place]) 04/27/2019  . Goals of care, counseling/discussion 10/01/2018  . Major depressive disorder, recurrent, in remission ([place]) 09/13/2018  . Metastasis from malignant tumor of breast ([place]) 09/13/2018  . Genetic testing 07/02/2018  .  Family history of breast cancer   . Family history of ovarian cancer   . Family history of pancreatic cancer   . Family history of colon cancer   . Family history of stomach cancer   . Family history of prostate cancer   . Carcinoma of upper-inner quadrant of left breast in female, estrogen receptor positive ([place]) 05/23/2016  . Type 2 diabetes mellitus with diabetic chronic kidney disease ([place]) 08/27/2015  . Cerebrovascular accident (CVA) ([place]) 08/27/2015  . Benign essential HTN 12/15/2014  . Chronic  systolic CHF (congestive heart failure) ([place]) 06/21/2014  . Combined fat and carbohydrate induced hyperlipemia 06/21/2014  . MI (mitral incompetence) 06/21/2014  . Asthma 06/09/2014  . Chronic kidney disease (CKD), stage V ([place]) 06/09/2014  . Anxiety and depression 06/09/2014    Past Surgical History:  Procedure Laterality Date  . AMPUTATION Left 03/30/2020   Procedure: AMPUTATION 5th RAY;  Surgeon: Samara Deist, DPM;  Location: [facility] ORS;  Service: Podiatry;  Laterality: Left;  . APPLICATION OF WOUND VAC Left 03/30/2020   Procedure: APPLICATION OF WOUND VAC;  Surgeon: Samara Deist, DPM;  Location: [facility] ORS;  Service: Podiatry;  Laterality: Left;  . CATARACT EXTRACTION W/PHACO Right 02/24/2019   Procedure: CATARACT EXTRACTION PHACO AND INTRAOCULAR LENS PLACEMENT ([place]) RIGHT DIABETES;  Surgeon: Marchia Meiers, MD;  Location: [facility] ORS;  Service: Ophthalmology;  Laterality: Right;  [place] 01:13.0 CDE 7.96 Fluid Pack Lot # [id number]  . CATARACT EXTRACTION W/PHACO Left 03/24/2019   Procedure: CATARACT EXTRACTION PHACO AND INTRAOCULAR LENS PLACEMENT (IOC) - left diabetic;  Surgeon: Marchia Meiers, MD;  Location: [facility] ORS;  Service: Ophthalmology;  Laterality: Left;  [place]  01:36 CDE 13.93 Fluid pack lot # [id number]  . CENTRAL LINE INSERTION-TUNNELED N/A 04/04/2020   Procedure: CENTRAL LINE INSERTION-TUNNELED;  Surgeon: Katha Cabal, MD;  Location: [place] [facility];  Service: Cardiovascular;  Laterality: N/A;  . CESAREAN SECTION    . CHOLECYSTECTOMY    . DIALYSIS/PERMA CATHETER REMOVAL N/A 05/01/2020   Procedure: DIALYSIS/PERMA CATHETER REMOVAL;  Surgeon: Katha Cabal, MD;  Location: [place] [facility];  Service: Cardiovascular;  Laterality: N/A;  . EXCISION OF TONGUE LESION N/A 08/17/2018   Procedure: EXCISION OF TONGUE LESION WITH FROZEN SECTIONS;  Surgeon: Beverly Gust, MD;  Location: [facility] ORS;  Service: ENT;  Laterality: N/A;  . EYE SURGERY Right    cataract extraction  .  IRRIGATION AND DEBRIDEMENT FOOT Left 03/30/2020   Procedure: IRRIGATION AND DEBRIDEMENT FOOT;  Surgeon: Samara Deist, DPM;  Location: [facility] ORS;  Service: Podiatry;  Laterality: Left;  . PARTIAL HYSTERECTOMY  12/31/2016   Per patient, she has not had a period in years since she had a partial hysterectomy.  Marland Kitchen PORTA CATH INSERTION    . TUBAL LIGATION      Prior to Admission medications   Medication Sig Start Date End Date Taking? Authorizing Provider  acetaminophen-codeine (TYLENOL #4) 300-60 MG tablet Take 1 tablet by mouth in the morning and at bedtime.     [provider]  albuterol (PROAIR HFA) 108 (90 BASE) MCG/ACT inhaler Inhale 2 puffs into the lungs every 6 (six) hours as needed for wheezing or shortness of breath.  06/20/14   [provider]  ALPRAZolam Duanne Moron) 0.5 MG tablet Take 0.5 mg by mouth 2 (two) times daily as needed for anxiety or sleep.     [provider]  amLODipine (NORVASC) 10 MG tablet Take 10 mg by mouth daily.  06/21/14   [provider]  aspirin EC 81 MG tablet Take 81 mg by mouth daily.  06/21/14   [provider]  atenolol (TENORMIN) 50 MG tablet Take 50 mg by mouth 2 (two) times daily.  04/07/14   [provider]  bumetanide (BUMEX) 2 MG tablet Take 0.5 mg by mouth 2 (two) times daily.  08/22/14   [provider]  calcitRIOL (ROCALTROL) 0.25 MCG capsule Take 0.25 mcg by mouth daily.  09/21/16   [provider]  Cinnamon 500 MG capsule Take 500 mg by mouth 2 (two) times daily.     [provider]  cloNIDine (CATAPRES) 0.2 MG tablet Take 0.2 mg by mouth 2 (two) times daily.  06/21/14   [provider]  docusate sodium (COLACE) 100 MG capsule Take 1 capsule (100 mg total) by mouth 2 (two) times daily. Patient taking differently: Take 100 mg by mouth daily.  04/06/20   Enzo Bi, MD  enalapril (VASOTEC) 10 MG tablet Take 20 mg by mouth 2 (two) times a day.  01/12/19   [provider]  famotidine (PEPCID) 20 MG tablet Take 20 mg by mouth 2 (two) times daily.  09/14/19   [provider]  ferrous sulfate 325 (65 FE) MG tablet Take 325 mg by mouth daily with breakfast.    [provider]  FLUoxetine (PROZAC) 20 MG capsule Take 20 mg by mouth 2 (two) times daily.  06/21/14   [provider]  Fluticasone Propionate, Inhal, (FLOVENT DISKUS) 100 MCG/BLIST AEPB Inhale 2 puffs into the lungs in the morning and at bedtime.    [provider]  gabapentin (NEURONTIN) 100 MG capsule TAKE 1 CAPSULE(100 MG) BY MOUTH AT BEDTIME Patient taking differently: Take 100 mg by mouth at bedtime.  11/04/19   Cammie Sickle, MD  glyBURIDE (DIABETA) 5 MG tablet Take 10 mg by mouth 2 (two) times daily with a meal.  06/20/14   [provider]  LEVEMIR FLEXTOUCH 100 UNIT/ML FlexPen Inject 25 Units into the skin daily. 04/06/20   Enzo Bi, MD  NOVOLOG FLEXPEN 100 UNIT/ML FlexPen Inject 5 Units into the skin 3 (three) times daily with meals. Patient taking differently: Inject 7 Units into the skin 2 (two) times daily with a meal.  04/06/20   Enzo Bi, MD  oxyCODONE-acetaminophen (PERCOCET/ROXICET) 5-325 MG tablet Take by mouth. 05/17/20 06/16/20  [provider]  senna (SENOKOT) 8.6 MG TABS tablet Take 1 tablet (8.6 mg total) by mouth 2 (two) times daily as needed for mild constipation or moderate constipation. 04/06/20   Enzo Bi, MD  simvastatin (ZOCOR) 20 MG tablet Take 20 mg by mouth every evening.     [provider]    Allergies Fish-derived products, Chlorhexidine, and Sulfamethoxazole-trimethoprim  Family History  Problem Relation Age of Onset  . Ovarian cancer Mother 69  . Diabetes Mother   . Hypertension Mother   . COPD Father   . Hypertension Father   . Colon cancer Father 51  . Diabetes Sister   . Breast cancer Sister 74       bilateral  . Diabetes Brother   . Leukemia Maternal Aunt   . Pancreatic cancer Paternal Aunt  25  . Pancreatic cancer Paternal Uncle   . Colon cancer Paternal Uncle   . Stomach cancer Maternal Grandfather 65  . Throat cancer Paternal Grandmother   . Breast cancer Maternal Aunt 80  . Colon cancer Maternal Aunt   . Bone cancer Maternal Aunt   . Breast  cancer Paternal Aunt        dx >50  . Prostate cancer Paternal Uncle   . Pancreatic cancer Paternal Uncle   . Throat cancer Paternal Uncle   . Lung cancer Paternal Uncle   . Stomach cancer Paternal Uncle   . Brain cancer Paternal Aunt   . Cancer Cousin        liver, kidney  . Prostate cancer Cousin        meastatic  . Lung cancer Other     Social History Social History   Tobacco Use  . Smoking status: Former Smoker    Packs/day: 0.50    Years: 1.00    Pack years: 0.50    Types: Cigarettes  . Smokeless tobacco: Never Used  Vaping Use  . Vaping Use: Never used  Substance Use Topics  . Alcohol use: No    Alcohol/week: 0.0 standard drinks  . Drug use: No    Review of Systems Constitutional: No fever/chills Eyes: No visual changes. ENT: No sore throat. Cardiovascular: Denies chest pain. Respiratory: See HPI Gastrointestinal: No abdominal pain.   Genitourinary: Negative for dysuria. Musculoskeletal: Negative for back pain. Skin: Negative for rash. Neurological: Negative for headaches, areas of focal weakness or numbness.    ____________________________________________   PHYSICAL EXAM:  VITAL SIGNS: ED Triage Vitals  Enc Vitals Group     BP 06/02/20 1041 125/69     Pulse Rate 06/02/20 1041 84     Resp 06/02/20 1041 18     Temp 06/02/20 1041 98.1 F (36.7 C)     Temp Source 06/02/20 1041 Oral     SpO2 06/02/20 1040 92 %     Weight 06/02/20 1043 191 lb (86.6 kg)     Height 06/02/20 1043 5\' 2"  (1.575 m)     Head Circumference --      Peak Flow --      Pain Score 06/02/20 1041 5     Pain Loc --      Pain Edu? --      Excl. in [place]? --     Constitutional: Alert and oriented.  Mildly dyspneic,  oxygen saturation 89% on room air when she is speaking.  This improves to the mid 90s as she rests.  I placed the patient on 2 L nasal cannula for comfort which she reports does help thereafter Eyes: Conjunctivae are normal. Head: Atraumatic. Nose: No congestion/rhinnorhea. Mouth/Throat: Mucous membranes are moist. Neck: No stridor.  Cardiovascular: Normal rate, regular rhythm. Grossly normal heart sounds.  Good peripheral circulation. Respiratory: Mild use of accessory muscles, appears moderately to mildly dyspneic.  Speaks in very short phrases.  Minimal tachypnea respiratory rate about 22.  Mild hypoxia on room air corrects with oxygen when administered via nasal cannula Gastrointestinal: Soft and nontender. No distention. Musculoskeletal: No lower extremity tenderness nor edema except her left lower foot which she has been bandaged, over the lateral left foot she has a slow to heal somewhat granulomatous edged incision, but there is no frank purulence or drainage or erythema surrounding.. Neurologic:  Normal speech and language. No gross focal neurologic deficits are appreciated.  Skin:  Skin is warm, dry and intact. No rash noted. Psychiatric: Mood and affect are normal. Speech and behavior are normal.  ____________________________________________   LABS (all labs ordered are listed, but only abnormal results are displayed)  Labs Reviewed  BASIC METABOLIC PANEL - Abnormal; Notable for the following components:      Result Value   Glucose,  Bld 267 (*)    BUN 50 (*)    Creatinine, Ser 3.17 (*)    Calcium 8.5 (*)    GFR, Estimated 16 (*)    All other components within normal limits  CBC - Abnormal; Notable for the following components:   WBC 20.5 (*)    RBC 2.71 (*)    Hemoglobin 7.9 (*)    HCT 24.4 (*)    RDW 15.8 (*)    Platelets 596 (*)    All other components within normal limits  BRAIN NATRIURETIC PEPTIDE - Abnormal; Notable for the following components:   B Natriuretic  Peptide 336.9 (*)    All other components within normal limits  RESPIRATORY PANEL BY RT PCR (FLU A&B, COVID)  CULTURE, BLOOD (ROUTINE X 2)  CULTURE, BLOOD (ROUTINE X 2)  URINE CULTURE  PROCALCITONIN  URINALYSIS, COMPLETE (UACMP) WITH MICROSCOPIC   ____________________________________________  EKG  Reviewed interpreted at 1040 Heart rate 85 QRS 80 QTc 470 Normal sinus rhythm, no evidence of acute ischemia.  Mild artifact ____________________________________________  RADIOLOGY  DG Chest 2 View  Result Date: 06/02/2020 CLINICAL DATA:  Shortness of breath EXAM: CHEST - 2 VIEW COMPARISON:  March 29, 2020 FINDINGS: Stable right Port-A-Cath. No pneumothorax. Bilateral pleural effusions with underlying opacities are similar in the interval. Mild interstitial prominence. No other acute abnormalities or changes. IMPRESSION: 1. Stable bilateral pleural effusions with underlying opacities. 2. Possible mild pulmonary edema/pulmonary venous congestion. Electronically Signed   By: Dorise Bullion III M.D   On: 06/02/2020 11:29     Personally viewed the patient's imaging, appears that she has bilateral pleural effusions.  Of note the radiologist did notes underlying opacities as well which appear to be stable. ____________________________________________   PROCEDURES  Procedure(s) performed: None  Procedures  Critical Care performed: No  ____________________________________________   INITIAL IMPRESSION / ASSESSMENT AND PLAN / ED COURSE  Pertinent labs & imaging results that were available during my care of the patient were reviewed by me and considered in my medical decision making (see chart for details).   Patient presents for dyspnea.  This appears somewhat exertional and also positional.  Has a history of congestive heart failure as well.  Of note today she denies any symptoms of infection such as fever or cough or chills, but she does have significant leukocytosis.  Additionally  she has mildly elevated creatinine from her baseline.  She has bilateral pleural effusions with possible underlying of opacities as well.  I do have concern regarding her elevated white count but given her presentation afebrile status I question if this may be volume overload, pneumonia, or multifactorial cause for shortness of breath including potentially anemia.  Differential is broad.  She does have a mild oxygen requirement as well.  She is not a good candidate for CT angiogram or IV contrast due to her renal function.  She denies chest pain, does not have pleuritic pain thus lowering my suspicion for pulmonary embolism  ----------------------------------------- 1:52 PM on 06/02/2020 -----------------------------------------  I have paged oncology to discuss and obtain recommendations, additionally as we were to determine cause of her dyspnea will send BNP and pro calcitonin to hopefully help differentiate potential etiologies and further tailor her care.  ----------------------------------------- 3:29 PM on 06/02/2020 -----------------------------------------    Pages to Dr. Mike Gip, thus far unanswered. Trying to determine if patient was on a recent steroid or neulasta which could explain elevated wbcs  She is listed as on-call for medical call oncology.  I have  discussed the case with Dr. Francine Graven and we will perform further work-up including CT of the chest without contrast and obtain cultures as we exclude the potential for infection though at this point aside from a elevated white count I favor volume overload and CHF as likely driving factor but certainly infection is still on her differential and part of her ongoing work-up today      ____________________________________________   FINAL CLINICAL IMPRESSION(S) / ED DIAGNOSES  Final diagnoses:  Shortness of breath  Leukocytosis, unspecified type        Note:  This document was prepared using Dragon voice recognition  software and may include unintentional dictation errors       Delman Kitten, MD 06/02/20 1530

## 2020-06-03 ENCOUNTER — Other Ambulatory Visit: Payer: Self-pay

## 2020-06-03 ENCOUNTER — Other Ambulatory Visit: Payer: Self-pay | Admitting: Hematology and Oncology

## 2020-06-03 DIAGNOSIS — J910 Malignant pleural effusion: Secondary | ICD-10-CM

## 2020-06-03 DIAGNOSIS — C7981 Secondary malignant neoplasm of breast: Secondary | ICD-10-CM

## 2020-06-03 DIAGNOSIS — C799 Secondary malignant neoplasm of unspecified site: Secondary | ICD-10-CM

## 2020-06-03 DIAGNOSIS — D72829 Elevated white blood cell count, unspecified: Secondary | ICD-10-CM

## 2020-06-03 DIAGNOSIS — I10 Essential (primary) hypertension: Secondary | ICD-10-CM

## 2020-06-03 DIAGNOSIS — C50919 Malignant neoplasm of unspecified site of unspecified female breast: Secondary | ICD-10-CM

## 2020-06-03 LAB — BASIC METABOLIC PANEL
Anion gap: 9 (ref 5–15)
BUN: 46 mg/dL — ABNORMAL HIGH (ref 8–23)
CO2: 27 mmol/L (ref 22–32)
Calcium: 8.3 mg/dL — ABNORMAL LOW (ref 8.9–10.3)
Chloride: 101 mmol/L (ref 98–111)
Creatinine, Ser: 2.73 mg/dL — ABNORMAL HIGH (ref 0.44–1.00)
GFR, Estimated: 19 mL/min — ABNORMAL LOW (ref >60)
Glucose, Bld: 80 mg/dL (ref 70–99)
Potassium: 4.2 mmol/L (ref 3.5–5.1)
Sodium: 137 mmol/L (ref 135–145)

## 2020-06-03 LAB — CBC
HCT: 19.5 % — ABNORMAL LOW (ref 36.0–46.0)
Hemoglobin: 6.4 g/dL — ABNORMAL LOW (ref 12.0–15.0)
MCH: 29.5 pg (ref 26.0–34.0)
MCHC: 32.8 g/dL (ref 30.0–36.0)
MCV: 89.9 fL (ref 80.0–100.0)
Platelets: 445 10*3/uL — ABNORMAL HIGH (ref 150–400)
RBC: 2.17 MIL/uL — ABNORMAL LOW (ref 3.87–5.11)
RDW: 15.7 % — ABNORMAL HIGH (ref 11.5–15.5)
WBC: 15.1 10*3/uL — ABNORMAL HIGH (ref 4.0–10.5)
nRBC: 0.3 % — ABNORMAL HIGH (ref 0.0–0.2)

## 2020-06-03 LAB — GLUCOSE, CAPILLARY
Glucose-Capillary: 142 mg/dL — ABNORMAL HIGH (ref 70–99)
Glucose-Capillary: 147 mg/dL — ABNORMAL HIGH (ref 70–99)

## 2020-06-03 LAB — CBG MONITORING, ED
Glucose-Capillary: 94 mg/dL (ref 70–99)
Glucose-Capillary: 157 mg/dL — ABNORMAL HIGH (ref 70–99)

## 2020-06-03 LAB — PREPARE RBC (CROSSMATCH)

## 2020-06-03 MED ORDER — BUDESONIDE 0.25 MG/2ML IN SUSP
0.2500 mg | Freq: Two times a day (BID) | RESPIRATORY_TRACT | Status: DC
  Administered 2020-06-03 – 2020-06-05 (×3): 0.25 mg via RESPIRATORY_TRACT
  Filled 2020-06-03 (×5): qty 2

## 2020-06-03 MED ORDER — SODIUM CHLORIDE 0.9% IV SOLUTION: Freq: Once | INTRAVENOUS | Status: DC

## 2020-06-03 MED ORDER — GLUCERNA SHAKE PO LIQD
237.0000 mL | Freq: Two times a day (BID) | ORAL | Status: DC
  Administered 2020-06-03 – 2020-06-06 (×5): 237 mL via ORAL

## 2020-06-03 MED ORDER — SODIUM CHLORIDE 0.9% IV SOLUTION: Freq: Once | INTRAVENOUS | Status: AC

## 2020-06-03 NOTE — Consult Note (Addendum)
PODIATRY / FOOT AND ANKLE SURGERY CONSULTATION NOTE  Requesting Physician:  Dr. Francine Graven  Reason for consult: L foot wound possible infection  Chief Complaint: L foot wound, SOB   HPI: [name] is a 64 y.o. female who presented at [facility] due to shortness of breath.  Patient was admitted through the emergency room due to increased bilateral pleural effusions.  Patient does have a history of metastatic stage IV breast cancer.  Patient is currently being treated for shortness of breath and pleural effusions.  Patient was noted to have notable increase in white blood cell count concerning for potential infection.  Patient previously had a left partial fifth ray amputation with Dr. Vickki Muff several months ago in which she is still trying to heal the wound.  Podiatry team was consulted due to potential concerns for infection.  Previous wound cultures grew MRSA and E. coli.  Patient has been treated for several weeks with IV antibiotics and has last completed oral doses of ciprofloxacin and doxycycline.  Patient notes no pain at this time to the left foot or tenderness.  Patient does have numbness and tingling to bilateral lower extremities.  PMHx:  Past Medical History:  Diagnosis Date  . Anemia   . Anxiety   . Asthma   . Cancer ([place]) 03/10/2018   Per NM PET order. Carcinoma of upper-inner quadrant of left breast in female, estrogen receptor positive .  Marland Kitchen Cancer (HCC)    LUNG  . CHF (congestive heart failure) ([place]) 1997  . CKD (chronic kidney disease)   . Depression   . Diabetes mellitus, type 2 ([place])   . Family history of breast cancer   . Family history of colon cancer   . Family history of ovarian cancer   . Family history of pancreatic cancer   . Family history of prostate cancer   . Family history of stomach cancer   . GERD (gastroesophageal reflux disease)    history of an ulcer  . Hair loss   . History of left breast cancer 05/29/14  . History of partial  hysterectomy 12/31/2016   Per patient.  Has not had a period in years.  Had a partial hysterectomy years ago.  Marland Kitchen Hypertension   . Mitral valve regurgitation   . Neuromuscular disorder (HCC)    neuropathies in hand  . Obesity   . Pancreatitis 1997  . Stroke [facility]) 2010   with mild left arm weakness    Surgical Hx:  Past Surgical History:  Procedure Laterality Date  . AMPUTATION Left 03/30/2020   Procedure: AMPUTATION 5th RAY;  Surgeon: Samara Deist, DPM;  Location: [facility] ORS;  Service: Podiatry;  Laterality: Left;  . APPLICATION OF WOUND VAC Left 03/30/2020   Procedure: APPLICATION OF WOUND VAC;  Surgeon: Samara Deist, DPM;  Location: [facility] ORS;  Service: Podiatry;  Laterality: Left;  . CATARACT EXTRACTION W/PHACO Right 02/24/2019   Procedure: CATARACT EXTRACTION PHACO AND INTRAOCULAR LENS PLACEMENT ([place]) RIGHT DIABETES;  Surgeon: Marchia Meiers, MD;  Location: [facility] ORS;  Service: Ophthalmology;  Laterality: Right;  [place] 01:13.0 CDE 7.96 Fluid Pack Lot # [id number]  . CATARACT EXTRACTION W/PHACO Left 03/24/2019   Procedure: CATARACT EXTRACTION PHACO AND INTRAOCULAR LENS PLACEMENT ([facility]) - left diabetic;  Surgeon: Marchia Meiers, MD;  Location: [facility] ORS;  Service: Ophthalmology;  Laterality: Left;  [place]  01:36 CDE 13.93 Fluid pack lot # [id number]  . CENTRAL LINE INSERTION-TUNNELED N/A 04/04/2020   Procedure: CENTRAL LINE INSERTION-TUNNELED;  Surgeon: Delana Meyer,  Dolores Lory, MD;  Location: [place] [facility] LAB;  Service: Cardiovascular;  Laterality: N/A;  . CESAREAN SECTION    . CHOLECYSTECTOMY    . DIALYSIS/PERMA CATHETER REMOVAL N/A 05/01/2020   Procedure: DIALYSIS/PERMA CATHETER REMOVAL;  Surgeon: Katha Cabal, MD;  Location: [place] [facility];  Service: Cardiovascular;  Laterality: N/A;  . EXCISION OF TONGUE LESION N/A 08/17/2018   Procedure: EXCISION OF TONGUE LESION WITH FROZEN SECTIONS;  Surgeon: Beverly Gust, MD;  Location: [facility] ORS;  Service: ENT;  Laterality: N/A;  . EYE SURGERY  Right    cataract extraction  . IRRIGATION AND DEBRIDEMENT FOOT Left 03/30/2020   Procedure: IRRIGATION AND DEBRIDEMENT FOOT;  Surgeon: Samara Deist, DPM;  Location: [facility] ORS;  Service: Podiatry;  Laterality: Left;  . PARTIAL HYSTERECTOMY  12/31/2016   Per patient, she has not had a period in years since she had a partial hysterectomy.  Marland Kitchen PORTA CATH INSERTION    . TUBAL LIGATION      FHx:  Family History  Problem Relation Age of Onset  . Ovarian cancer Mother 80  . Diabetes Mother   . Hypertension Mother   . COPD Father   . Hypertension Father   . Colon cancer Father 47  . Diabetes Sister   . Breast cancer Sister 7       bilateral  . Diabetes Brother   . Leukemia Maternal Aunt   . Pancreatic cancer Paternal Aunt 86  . Pancreatic cancer Paternal Uncle   . Colon cancer Paternal Uncle   . Stomach cancer Maternal Grandfather 44  . Throat cancer Paternal Grandmother   . Breast cancer Maternal Aunt 80  . Colon cancer Maternal Aunt   . Bone cancer Maternal Aunt   . Breast cancer Paternal Aunt        dx >50  . Prostate cancer Paternal Uncle   . Pancreatic cancer Paternal Uncle   . Throat cancer Paternal Uncle   . Lung cancer Paternal Uncle   . Stomach cancer Paternal Uncle   . Brain cancer Paternal Aunt   . Cancer Cousin        liver, kidney  . Prostate cancer Cousin        meastatic  . Lung cancer Other     Social History:  reports that she has quit smoking. Her smoking use included cigarettes. She has a 0.50 pack-year smoking history. She has never used smokeless tobacco. She reports that she does not drink alcohol and does not use drugs.  Allergies:  Allergies  Allergen Reactions  . Fish-Derived Products Anaphylaxis  . Sulfamethoxazole-Trimethoprim Other (See Comments)    Caused kidney issues  . Chlorhexidine     Review of Systems: General ROS: positive for  - fatigue Respiratory ROS: positive for - shortness of breath Cardiovascular ROS: positive for -  dyspnea on exertion Gastrointestinal ROS: no abdominal pain, change in bowel habits, or black or bloody stools Musculoskeletal ROS: positive for - joint swelling Neurological ROS: positive for - numbness/tingling Dermatological ROS: positive for Left lateral foot wound  (Not in a hospital admission)   Physical Exam: General: Alert and oriented.  No apparent distress.  Vascular: DP/PT pulses faintly palpable left side bilateral.  No hair growth noted to digits bilateral.  Moderate bilateral lower extremity edema present, pitting.  Neuro: Light touch sensation absent to bilateral lower extremities.  Derm: Ulcerative site present to the left fifth metatarsal base area extending to the distal lateral foot, appears to on initial examination have  a large amount of fibrous tissue present to the wound.  No bone appears to be exposed and the wound appears to probe to subcutaneous tissue only.  No purulence.  No erythema to the area, minimal edema to the specific spot.  No odor.  No acute signs of infection present to the wound.  Wound measures approximately 5.5 x 0.4 x 0.6 cm.  Appearance of wound after debridement performed today bedside.   MSK: Left partial fifth ray amputation  Results for orders placed or performed during the hospital encounter of 06/02/20 (from the past 48 hour(s))  Basic metabolic panel     Status: Abnormal   Collection Time: 06/02/20 10:48 AM  Result Value Ref Range   Sodium 136 135 - 145 mmol/L   Potassium 4.5 3.5 - 5.1 mmol/L   Chloride 98 98 - 111 mmol/L   CO2 25 22 - 32 mmol/L   Glucose, Bld 267 (H) 70 - 99 mg/dL    Comment: Glucose reference range applies only to samples taken after fasting for at least 8 hours.   BUN 50 (H) 8 - 23 mg/dL   Creatinine, Ser 3.17 (H) 0.44 - 1.00 mg/dL   Calcium 8.5 (L) 8.9 - 10.3 mg/dL   GFR, Estimated 16 (L) >60 mL/min    Comment: (NOTE) Calculated using the CKD-EPI Creatinine Equation (2021)    Anion gap 13 5 - 15     Comment: Performed at [facility], [address] [place], [place] [address]  CBC     Status: Abnormal   Collection Time: 06/02/20 10:48 AM  Result Value Ref Range   WBC 20.5 (H) 4.0 - 10.5 K/uL   RBC 2.71 (L) 3.87 - 5.11 MIL/uL   Hemoglobin 7.9 (L) 12.0 - 15.0 g/dL   HCT 24.4 (L) 36 - 46 %   MCV 90.0 80.0 - 100.0 fL   MCH 29.2 26.0 - 34.0 pg   MCHC 32.4 30.0 - 36.0 g/dL   RDW 15.8 (H) 11.5 - 15.5 %   Platelets 596 (H) 150 - 400 K/uL   nRBC 0.0 0.0 - 0.2 %    Comment: Performed at [facility], [address], [place], [place] [address]  Brain natriuretic peptide     Status: Abnormal   Collection Time: 06/02/20 10:48 AM  Result Value Ref Range   B Natriuretic Peptide 336.9 (H) 0.0 - 100.0 pg/mL    Comment: Performed at [facility], [address] [place], [place] [address]  Procalcitonin - Baseline     Status: None   Collection Time: 06/02/20 10:48 AM  Result Value Ref Range   Procalcitonin 0.47 ng/mL    Comment:        Interpretation: PCT (Procalcitonin) <= 0.5 ng/mL: Systemic infection (sepsis) is not likely. Local bacterial infection is possible. (NOTE)       Sepsis PCT Algorithm           Lower Respiratory Tract                                      Infection PCT Algorithm    ----------------------------     ----------------------------         PCT < 0.25 ng/mL                PCT < 0.10 ng/mL          Strongly encourage  Strongly discourage   discontinuation of antibiotics    initiation of antibiotics    ----------------------------     -----------------------------       PCT 0.25 - 0.50 ng/mL            PCT 0.10 - 0.25 ng/mL               OR       >80% decrease in PCT            Discourage initiation of                                            antibiotics      Encourage discontinuation           of antibiotics    ----------------------------     -----------------------------         PCT >= 0.50 ng/mL              PCT 0.26  - 0.50 ng/mL               AND        <80% decrease in PCT             Encourage initiation of                                             antibiotics       Encourage continuation           of antibiotics    ----------------------------     -----------------------------        PCT >= 0.50 ng/mL                  PCT > 0.50 ng/mL               AND         increase in PCT                  Strongly encourage                                      initiation of antibiotics    Strongly encourage escalation           of antibiotics                                     -----------------------------                                           PCT <= 0.25 ng/mL                                                 OR                                        >  80% decrease in PCT                                      Discontinue / Do not initiate                                             antibiotics  Performed at [facility], [address] [place], [place] [address]   Respiratory Panel by RT PCR (Flu A&B, Covid) - Nasopharyngeal Swab     Status: None   Collection Time: 06/02/20  2:08 PM   Specimen: Nasopharyngeal Swab  Result Value Ref Range   SARS Coronavirus 2 by RT PCR NEGATIVE NEGATIVE    Comment: (NOTE) SARS-CoV-2 target nucleic acids are NOT DETECTED.  The SARS-CoV-2 RNA is generally detectable in upper respiratoy specimens during the acute phase of infection. The lowest concentration of SARS-CoV-2 viral copies this assay can detect is 131 copies/mL. A negative result does not preclude SARS-Cov-2 infection and should not be used as the sole basis for treatment or other patient management decisions. A negative result may occur with  improper specimen collection/handling, submission of specimen other than nasopharyngeal swab, presence of viral mutation(s) within the areas targeted by this assay, and inadequate number of viral copies (<131 copies/mL). A negative result must be  combined with clinical observations, patient history, and epidemiological information. The expected result is Negative.  Fact Sheet for Patients:  PinkCheek.be  Fact Sheet for Healthcare Providers:  GravelBags.it  This test is no t yet approved or cleared by the [place] FDA and  has been authorized for detection and/or diagnosis of SARS-CoV-2 by FDA under an Emergency Use Authorization (EUA). This EUA will remain  in effect (meaning this test can be used) for the duration of the COVID-19 declaration under Section 564(b)(1) of the Act, 21 U.S.C. section 360bbb-3(b)(1), unless the authorization is terminated or revoked sooner.     Influenza A by PCR NEGATIVE NEGATIVE   Influenza B by PCR NEGATIVE NEGATIVE    Comment: (NOTE) The Xpert Xpress SARS-CoV-2/FLU/RSV assay is intended as an aid in  the diagnosis of influenza from Nasopharyngeal swab specimens and  should not be used as a sole basis for treatment. Nasal washings and  aspirates are unacceptable for Xpert Xpress SARS-CoV-2/FLU/RSV  testing.  Fact Sheet for Patients: PinkCheek.be  Fact Sheet for Healthcare Providers: GravelBags.it  This test is not yet approved or cleared by the [place] FDA and  has been authorized for detection and/or diagnosis of SARS-CoV-2 by  FDA under an Emergency Use Authorization (EUA). This EUA will remain  in effect (meaning this test can be used) for the duration of the  Covid-19 declaration under Section 564(b)(1) of the Act, 21  U.S.C. section 360bbb-3(b)(1), unless the authorization is  terminated or revoked. Performed at [facility], [address] [place], [place] [address]   Culture, blood (Routine X 2) w Reflex to ID Panel     Status: None (Preliminary result)   Collection Time: 06/02/20  4:32 PM   Specimen: BLOOD  Result Value Ref Range    Specimen Description BLOOD RIGHT PORT    Special Requests      BOTTLES DRAWN AEROBIC AND ANAEROBIC Blood Culture adequate volume   Culture  NO GROWTH < 12 HOURS Performed at [facility], [address] [place], [place] [address]    Report Status PENDING   Culture, blood (Routine X 2) w Reflex to ID Panel     Status: None (Preliminary result)   Collection Time: 06/02/20  6:00 PM   Specimen: BLOOD  Result Value Ref Range   Specimen Description BLOOD RIGHT PORT    Special Requests      BOTTLES DRAWN AEROBIC AND ANAEROBIC Blood Culture adequate volume   Culture      NO GROWTH < 12 HOURS Performed at [facility], [address] [place], [place] [address]    Report Status PENDING   Urinalysis, Complete w Microscopic     Status: Abnormal   Collection Time: 06/02/20  6:00 PM  Result Value Ref Range   Color, Urine YELLOW (A) YELLOW   APPearance HAZY (A) CLEAR   Specific Gravity, Urine 1.010 1.005 - 1.030   pH 6.0 5.0 - 8.0   Glucose, UA 50 (A) NEGATIVE mg/dL   Hgb urine dipstick NEGATIVE NEGATIVE   Bilirubin Urine NEGATIVE NEGATIVE   Ketones, ur NEGATIVE NEGATIVE mg/dL   Protein, ur 100 (A) NEGATIVE mg/dL   Nitrite NEGATIVE NEGATIVE   Leukocytes,Ua NEGATIVE NEGATIVE   RBC / HPF 0-5 0 - 5 RBC/hpf   WBC, UA 0-5 0 - 5 WBC/hpf   Bacteria, UA NONE SEEN NONE SEEN   Squamous Epithelial / LPF 6-10 0 - 5    Comment: Performed at [facility], [address] [place], [place] [address]  CBG monitoring, ED     Status: Abnormal   Collection Time: 06/02/20  8:11 PM  Result Value Ref Range   Glucose-Capillary 132 (H) 70 - 99 mg/dL    Comment: Glucose reference range applies only to samples taken after fasting for at least 8 hours.  CBG monitoring, ED     Status: Abnormal   Collection Time: 06/02/20 10:59 PM  Result Value Ref Range   Glucose-Capillary 101 (H) 70 - 99 mg/dL    Comment: Glucose reference range applies only to samples taken after fasting for  at least 8 hours.  Basic metabolic panel     Status: Abnormal   Collection Time: 06/03/20  5:04 AM  Result Value Ref Range   Sodium 137 135 - 145 mmol/L   Potassium 4.2 3.5 - 5.1 mmol/L   Chloride 101 98 - 111 mmol/L   CO2 27 22 - 32 mmol/L   Glucose, Bld 80 70 - 99 mg/dL    Comment: Glucose reference range applies only to samples taken after fasting for at least 8 hours.   BUN 46 (H) 8 - 23 mg/dL   Creatinine, Ser 2.73 (H) 0.44 - 1.00 mg/dL   Calcium 8.3 (L) 8.9 - 10.3 mg/dL   GFR, Estimated 19 (L) >60 mL/min    Comment: (NOTE) Calculated using the CKD-EPI Creatinine Equation (2021)    Anion gap 9 5 - 15    Comment: Performed at [facility], [address] [place], [place] [address]  CBC     Status: Abnormal   Collection Time: 06/03/20  5:04 AM  Result Value Ref Range   WBC 15.1 (H) 4.0 - 10.5 K/uL   RBC 2.17 (L) 3.87 - 5.11 MIL/uL   Hemoglobin 6.4 (L) 12.0 - 15.0 g/dL   HCT 19.5 (L) 36 - 46 %   MCV 89.9 80.0 - 100.0 fL   MCH 29.5 26.0 - 34.0  pg   MCHC 32.8 30.0 - 36.0 g/dL   RDW 15.7 (H) 11.5 - 15.5 %   Platelets 445 (H) 150 - 400 K/uL   nRBC 0.3 (H) 0.0 - 0.2 %    Comment: Performed at [facility], [address] [place], [place] [address]  CBG monitoring, ED     Status: None   Collection Time: 06/03/20  9:19 AM  Result Value Ref Range   Glucose-Capillary 94 70 - 99 mg/dL    Comment: Glucose reference range applies only to samples taken after fasting for at least 8 hours.   DG Chest 2 View  Result Date: 06/02/2020 CLINICAL DATA:  Shortness of breath EXAM: CHEST - 2 VIEW COMPARISON:  March 29, 2020 FINDINGS: Stable right Port-A-Cath. No pneumothorax. Bilateral pleural effusions with underlying opacities are similar in the interval. Mild interstitial prominence. No other acute abnormalities or changes. IMPRESSION: 1. Stable bilateral pleural effusions with underlying opacities. 2. Possible mild pulmonary edema/pulmonary venous congestion.  Electronically Signed   By: Dorise Bullion III M.D   On: 06/02/2020 11:29   CT Chest Wo Contrast  Result Date: 06/02/2020 CLINICAL DATA:  64 year old female with a history of shortness of breath, metastatic breast cancer EXAM: CT CHEST WITHOUT CONTRAST TECHNIQUE: Multidetector CT imaging of the chest was performed following the standard protocol without IV contrast. COMPARISON:  PET-CT 03/28/2020 FINDINGS: Cardiovascular: Heart size is unchanged. No pericardial fluid. Normal course caliber and contour of the thoracic aorta. No significant atherosclerotic changes. Right IJ port catheter. Mediastinum/Nodes: Unremarkable thyroid/thoracic inlet. Unremarkable thoracic esophagus. The subcarinal node node measures smaller than the comparison PET CT, previously approximately 10 mm, now 7 mm. Otherwise, no enlarged mediastinal lymph nodes. Lungs/Pleura: Bilateral pleural effusions, predominantly in the dependent aspects of the lungs. There is rim of hyperdensity adjacent to the low-density fluid at the costophrenic sulci and extending to the medial, posterior, and lateral pleural/fluid interface of the bilateral lungs. The fluid extends along the lateral lung to the anterior lung on the left, with small volume of partially trapped fluid. There is hyperdense tissue thickening along the pleural mediastinal interface of the upper mediastinum, more pronounced on the left. The appearance of the nodular thickening is relatively similar to the PET CT dated 03/28/2020. Overall, the volume of fluid appears to have slightly progressed. Nodular lung changes at the periphery of the left upper lobe on image 45 of series 3, left lower lobe on image 86 of series 2, and at the lower right lung, image 75 of series 2 and image 92 of series 2. No evidence of interlobular septal thickening. There is no confluent airspace disease. No endotracheal or endobronchial debris. Upper Abdomen: No acute finding of the upper abdomen.  Musculoskeletal: Redemonstration of spiculated soft tissue nodule of the left breast, compatible with the given history of breast cancer. The more inferior aspect of the mass measures slightly larger in AP dimension on the axial CT, previously 27 mm, now 30 mm. Although sclerotic foci within the skeleton are relatively unchanged, there are new/enlarging lytic lesions of the thoracic spine including: T5 on image 48 of series 2, with new lytic lesion adjacent to the sclerotic focus T6 at the anterior vertebral body, enlarging from the prior T7 at the right vertebral body, mixed lytic and sclerotic, enlarging from the prior T10 at the anterior vertebral body, enlarging T11, posterior vertebral body, enlarging IMPRESSION: Bilateral malignant pleural effusions, which are only slightly larger than the comparison PET CT of 03/28/2020, with associated  evidence of visceral/parietal pleural metastases. Redemonstration skeletal metastases, with new/enlarging lytic lesions of the thoracic spine compatible with progression. The left breast mass appears to be enlarging compared to the prior PET. Electronically Signed   By: Corrie Mckusick D.O.   On: 06/02/2020 15:35   DG Foot Complete Left  Result Date: 06/02/2020 CLINICAL DATA:  Osteomyelitis of the left foot. EXAM: LEFT FOOT - COMPLETE 3+ VIEW COMPARISON:  April 03, 2020 FINDINGS: There is no evidence of fracture or dislocation. There is no evidence of cortical destructive lesions. Stable postsurgical changes from transmetatarsal amputation of the fifth ray. Soft tissue swelling of the midfoot. IMPRESSION: 1. No radiographic evidence of osteomyelitis. 2. Soft tissue swelling of the midfoot. 3. Stable postsurgical changes from transmetatarsal amputation of the fifth ray. Electronically Signed   By: Fidela Salisbury M.D.   On: 06/02/2020 16:51    Blood pressure 120/70, pulse 81, temperature 98.1 F (36.7 C), temperature source Oral, resp. rate 18, height 5\' 2"  (1.575  m), weight 86.6 kg, SpO2 99 %.   Assessment 1. Left fifth metatarsal base/distal ulceration status post partial fifth ray amputation, subcutaneous tissue depth 2. Diabetes type 2 polyneuropathy 3. PVD 4. Shortness of breath secondary to pleural effusions and metastatic breast cancer 5. CKD  Plan -Patient seen and examined -X-ray imaging reviewed and discussed with patient in detail and compared to previous imaging that was taken few months ago.  Shows no changes at all to the bone to the fifth metatarsal base to indicate osteomyelitis.  No signs of infection present. -Clinically the bone appears to be completely covered with tissue at this time and appears to have a nonhealing ulceration to the lateral aspect the left foot with large amount of fibrous tissue present that probes only to subcutaneous tissue. -Wound debridement performed as described below without incident.  Patient tolerated procedure well.  Wound culture taken and sent off.  Order placed. -Redressed with Betadine soaked gauze inside the open ulceration site followed by 4 x 4 gauze, ABD, Kerlix, Ace wrap.  Patient tolerated well. -Appreciate recommendations for antibiotic therapy but at this time do not see any obvious signs of infection to the left foot based on clinical and radiographic examination.  If other potential concerns arise and no other sources of infection are noted then could potentially obtain an MRI of the left foot but do not believe it warrants it at this time based on the clinical and radiographic picture so far. -Dressing changes ordered daily. -Patient should be using Prevalon boots when in bed to avoid pressure type ulcerations.  Orders placed.  100% subcutaneous wound debridement procedure: Verbal consent obtained Prepped the area with Betadine.  100% excisional subcutaneous wound debridement performed to the left fifth metatarsal partial ray amputation ulceration/dehsicence site with a 15 blade without  incident removing all nonviable necrotic tissue to the level subcutaneous tissue removing fibrous debris as well as hyperkeratotic tissue.  Wound measurement predebridement measures 5.5 x 0.4 x 0.6, post debridement measurements were the same.  Hemostasis achieved with compression.  Wound bed appeared to be 100% granular after debridement.  Applied mildly compressive dressing as described above.  Podiatry team will follow peripherally until discharge.  Patient appears to be very stable at this time.  Patient to continue follow-up with Dr. Vickki Muff or myself and with wound care center after discharge.  Could potentially in the future look into performing skin substitutes on this area if insurance allows.  Caroline More, DPM 06/03/2020, 11:14 AM

## 2020-06-03 NOTE — ED Notes (Signed)
Patient assisted to bed side commode with instructions to use call bell when she was finished so we could help her back into the bed.

## 2020-06-03 NOTE — Discharge Instructions (Signed)
Continue same previous dressing change instructions that were given by wound care center.

## 2020-06-03 NOTE — Progress Notes (Signed)
[facility]  ROUNDING NOTE   Subjective:   Patient reports she has some shortness of breath when not on the oxygen. Typically does not use oxygen at home. Denies any urinary symptoms, diarrhea. Admits to edema. She has a good appetite but reports she is not able to eat much.  She reports that she has not received her blood transfusion yet today   Objective:  Vital signs in last 24 hours:  Pulse Rate:  [74-81] 81 (10/31 0900) Resp:  [16-18] 18 (10/31 0552) BP: (94-120)/(63-70) 120/70 (10/31 0900) SpO2:  [98 %-100 %] 99 % (10/31 0900)  Weight change:  Filed Weights   06/02/20 1043  Weight: 86.6 kg    Intake/Output: No intake/output data recorded.   Intake/Output this shift:  No intake/output data recorded.  Physical Exam: General: NAD,   Head: Normocephalic, atraumatic. Moist oral mucosal membranes  Eyes: Anicteric, PERRL  Neck: Supple, trachea midline  Lungs:  Decreased breath sounds, On 3 L nasal cannula   Heart: Regular rate and rhythm  Abdomen:  Soft, nontender,   Extremities:  + peripheral edema.  Neurologic: Nonfocal, moving all four extremities  Skin: No lesions       Basic Metabolic Panel: Recent Labs  Lab 05/28/20 0829 06/02/20 1048 06/03/20 0504  NA 137 136 137  K 4.6 4.5 4.2  CL 102 98 101  CO2 28 25 27   GLUCOSE 67* 267* 80  BUN 37* 50* 46*  CREATININE 2.84* 3.17* 2.73*  CALCIUM 8.5* 8.5* 8.3*    Liver Function Tests: Recent Labs  Lab 05/28/20 0829  AST 14*  ALT 9  ALKPHOS 71  BILITOT 0.5  PROT 6.9  ALBUMIN 2.6*   No results for input(s): LIPASE, AMYLASE in the last 168 hours. No results for input(s): AMMONIA in the last 168 hours.  CBC: Recent Labs  Lab 05/28/20 0829 06/02/20 1048 06/03/20 0504  WBC 16.7* 20.5* 15.1*  NEUTROABS 9.5*  --   --   HGB 8.8* 7.9* 6.4*  HCT 26.9* 24.4* 19.5*  MCV 88.8 90.0 89.9  PLT 883* 596* 445*    Cardiac Enzymes: No results for input(s): CKTOTAL, CKMB, CKMBINDEX, TROPONINI in  the last 168 hours.  BNP: Invalid input(s): POCBNP  CBG: Recent Labs  Lab 06/02/20 2011 06/02/20 2259 06/03/20 0919  GLUCAP 132* 101* 94    Microbiology: Results for orders placed or performed during the hospital encounter of 06/02/20  Respiratory Panel by RT PCR (Flu A&B, Covid) - Nasopharyngeal Swab     Status: None   Collection Time: 06/02/20  2:08 PM   Specimen: Nasopharyngeal Swab  Result Value Ref Range Status   SARS Coronavirus 2 by RT PCR NEGATIVE NEGATIVE Final    Comment: (NOTE) SARS-CoV-2 target nucleic acids are NOT DETECTED.  The SARS-CoV-2 RNA is generally detectable in upper respiratoy specimens during the acute phase of infection. The lowest concentration of SARS-CoV-2 viral copies this assay can detect is 131 copies/mL. A negative result does not preclude SARS-Cov-2 infection and should not be used as the sole basis for treatment or other patient management decisions. A negative result may occur with  improper specimen collection/handling, submission of specimen other than nasopharyngeal swab, presence of viral mutation(s) within the areas targeted by this assay, and inadequate number of viral copies (<131 copies/mL). A negative result must be combined with clinical observations, patient history, and epidemiological information. The expected result is Negative.  Fact Sheet for Patients:  PinkCheek.be  Fact Sheet for Healthcare Providers:  GravelBags.it  This test is no t yet approved or cleared by the [place] and  has been authorized for detection and/or diagnosis of SARS-CoV-2 by FDA under an Emergency Use Authorization (EUA). This EUA will remain  in effect (meaning this test can be used) for the duration of the COVID-19 declaration under Section 564(b)(1) of the Act, 21 U.S.C. section 360bbb-3(b)(1), unless the authorization is terminated or revoked sooner.     Influenza A by PCR  NEGATIVE NEGATIVE Final   Influenza B by PCR NEGATIVE NEGATIVE Final    Comment: (NOTE) The Xpert Xpress SARS-CoV-2/FLU/RSV assay is intended as an aid in  the diagnosis of influenza from Nasopharyngeal swab specimens and  should not be used as a sole basis for treatment. Nasal washings and  aspirates are unacceptable for Xpert Xpress SARS-CoV-2/FLU/RSV  testing.  Fact Sheet for Patients: PinkCheek.be  Fact Sheet for Healthcare Providers: GravelBags.it  This test is not yet approved or cleared by the [place] FDA and  has been authorized for detection and/or diagnosis of SARS-CoV-2 by  FDA under an Emergency Use Authorization (EUA). This EUA will remain  in effect (meaning this test can be used) for the duration of the  Covid-19 declaration under Section 564(b)(1) of the Act, 21  U.S.C. section 360bbb-3(b)(1), unless the authorization is  terminated or revoked. Performed at [facility], [address] [place], [place] [address]   Culture, blood (Routine X 2) w Reflex to ID Panel     Status: None (Preliminary result)   Collection Time: 06/02/20  4:32 PM   Specimen: BLOOD  Result Value Ref Range Status   Specimen Description BLOOD RIGHT PORT  Final   Special Requests   Final    BOTTLES DRAWN AEROBIC AND ANAEROBIC Blood Culture adequate volume   Culture   Final    NO GROWTH < 12 HOURS Performed at [facility], [address], [place], [place] [address]    Report Status PENDING  Incomplete  Culture, blood (Routine X 2) w Reflex to ID Panel     Status: None (Preliminary result)   Collection Time: 06/02/20  6:00 PM   Specimen: BLOOD  Result Value Ref Range Status   Specimen Description BLOOD RIGHT PORT  Final   Special Requests   Final    BOTTLES DRAWN AEROBIC AND ANAEROBIC Blood Culture adequate volume   Culture   Final    NO GROWTH < 12 HOURS Performed at [facility], [address] [place], [place] [address]    Report Status PENDING  Incomplete    Coagulation Studies: No results for input(s): LABPROT, INR in the last 72 hours.  Urinalysis: Recent Labs    06/02/20 1800  COLORURINE YELLOW*  LABSPEC 1.010  PHURINE 6.0  GLUCOSEU 50*  HGBUR NEGATIVE  BILIRUBINUR NEGATIVE  KETONESUR NEGATIVE  PROTEINUR 100*  NITRITE NEGATIVE  LEUKOCYTESUR NEGATIVE      Imaging: DG Chest 2 View  Result Date: 06/02/2020 CLINICAL DATA:  Shortness of breath EXAM: CHEST - 2 VIEW COMPARISON:  March 29, 2020 FINDINGS: Stable right Port-A-Cath. No pneumothorax. Bilateral pleural effusions with underlying opacities are similar in the interval. Mild interstitial prominence. No other acute abnormalities or changes. IMPRESSION: 1. Stable bilateral pleural effusions with underlying opacities. 2. Possible mild pulmonary edema/pulmonary venous congestion. Electronically Signed   By: Dorise Bullion III M.D   On: 06/02/2020 11:29   CT Chest Wo Contrast  Result Date: 06/02/2020 CLINICAL DATA:  64 year old female with a  history of shortness of breath, metastatic breast cancer EXAM: CT CHEST WITHOUT CONTRAST TECHNIQUE: Multidetector CT imaging of the chest was performed following the standard protocol without IV contrast. COMPARISON:  PET-CT 03/28/2020 FINDINGS: Cardiovascular: Heart size is unchanged. No pericardial fluid. Normal course caliber and contour of the thoracic aorta. No significant atherosclerotic changes. Right IJ port catheter. Mediastinum/Nodes: Unremarkable thyroid/thoracic inlet. Unremarkable thoracic esophagus. The subcarinal node node measures smaller than the comparison PET CT, previously approximately 10 mm, now 7 mm. Otherwise, no enlarged mediastinal lymph nodes. Lungs/Pleura: Bilateral pleural effusions, predominantly in the dependent aspects of the lungs. There is rim of hyperdensity adjacent to the low-density fluid at the costophrenic sulci and extending to the medial,  posterior, and lateral pleural/fluid interface of the bilateral lungs. The fluid extends along the lateral lung to the anterior lung on the left, with small volume of partially trapped fluid. There is hyperdense tissue thickening along the pleural mediastinal interface of the upper mediastinum, more pronounced on the left. The appearance of the nodular thickening is relatively similar to the PET CT dated 03/28/2020. Overall, the volume of fluid appears to have slightly progressed. Nodular lung changes at the periphery of the left upper lobe on image 45 of series 3, left lower lobe on image 86 of series 2, and at the lower right lung, image 75 of series 2 and image 92 of series 2. No evidence of interlobular septal thickening. There is no confluent airspace disease. No endotracheal or endobronchial debris. Upper Abdomen: No acute finding of the upper abdomen. Musculoskeletal: Redemonstration of spiculated soft tissue nodule of the left breast, compatible with the given history of breast cancer. The more inferior aspect of the mass measures slightly larger in AP dimension on the axial CT, previously 27 mm, now 30 mm. Although sclerotic foci within the skeleton are relatively unchanged, there are new/enlarging lytic lesions of the thoracic spine including: T5 on image 48 of series 2, with new lytic lesion adjacent to the sclerotic focus T6 at the anterior vertebral body, enlarging from the prior T7 at the right vertebral body, mixed lytic and sclerotic, enlarging from the prior T10 at the anterior vertebral body, enlarging T11, posterior vertebral body, enlarging IMPRESSION: Bilateral malignant pleural effusions, which are only slightly larger than the comparison PET CT of 03/28/2020, with associated evidence of visceral/parietal pleural metastases. Redemonstration skeletal metastases, with new/enlarging lytic lesions of the thoracic spine compatible with progression. The left breast mass appears to be enlarging  compared to the prior PET. Electronically Signed   By: Corrie Mckusick D.O.   On: 06/02/2020 15:35   DG Foot Complete Left  Result Date: 06/02/2020 CLINICAL DATA:  Osteomyelitis of the left foot. EXAM: LEFT FOOT - COMPLETE 3+ VIEW COMPARISON:  April 03, 2020 FINDINGS: There is no evidence of fracture or dislocation. There is no evidence of cortical destructive lesions. Stable postsurgical changes from transmetatarsal amputation of the fifth ray. Soft tissue swelling of the midfoot. IMPRESSION: 1. No radiographic evidence of osteomyelitis. 2. Soft tissue swelling of the midfoot. 3. Stable postsurgical changes from transmetatarsal amputation of the fifth ray. Electronically Signed   By: Fidela Salisbury M.D.   On: 06/02/2020 16:51     Medications:   . sodium chloride    . cefTRIAXone (ROCEPHIN)  IV Stopped (06/02/20 2226)  . [START ON 06/04/2020] vancomycin     . amLODipine  10 mg Oral Daily  . atenolol  50 mg Oral Daily  . budesonide (PULMICORT) nebulizer solution  0.25  mg Nebulization BID  . bumetanide  0.5 mg Oral BID  . calcitRIOL  0.25 mcg Oral Daily  . cloNIDine  0.2 mg Oral BID  . docusate sodium  100 mg Oral Daily  . famotidine  20 mg Oral BID  . ferrous sulfate  325 mg Oral Q breakfast  . FLUoxetine  20 mg Oral BID  . gabapentin  100 mg Oral QHS  . insulin aspart  0-15 Units Subcutaneous TID WC  . simvastatin  20 mg Oral QHS  . sodium chloride flush  3 mL Intravenous Q12H   sodium chloride, albuterol, ALPRAZolam, bisacodyl, ondansetron **OR** ondansetron (ZOFRAN) IV, oxyCODONE-acetaminophen, senna, sodium chloride flush  Assessment/ Plan:  [name] is a 64 y.o.  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 Chronic kidney disease stage IV  - creatinine 2.73, GFR 19. Unclear of patients baseline. Recent transition to IV.   2. Hypertension: controlled  - on multiple medications: enalapril, atenolol, bumetanide, clonidine and amlodipine.   3.  Anemia of CKD and chemotherapy induced  - hgb 6.4  - planning for a transfusion   4. Bilateral pleural effusions  - management per primary team            LOS: Nora 10/31/202111:56 AM

## 2020-06-03 NOTE — Progress Notes (Signed)
[facility] [place] NAME: [name]    MR#:  [id number]  DATE OF BIRTH:  [birth year]  SUBJECTIVE:  patient came in with increasing shortness of breath and weakness. Found to have bilateral pleural effusion and anemia. Denies any complaints other than poor appetite. She is requesting glucerna drink.  REVIEW OF SYSTEMS:   Review of Systems  Constitutional: Positive for malaise/fatigue. Negative for chills, fever and weight loss.  HENT: Negative for ear discharge, ear pain and nosebleeds.   Eyes: Negative for blurred vision, pain and discharge.  Respiratory: Positive for shortness of breath. Negative for sputum production, wheezing and stridor.   Cardiovascular: Positive for leg swelling. Negative for chest pain, palpitations, orthopnea and PND.  Gastrointestinal: Positive for nausea. Negative for abdominal pain, diarrhea and vomiting.  Genitourinary: Negative for frequency and urgency.  Musculoskeletal: Negative for back pain and joint pain.  Neurological: Positive for weakness. Negative for sensory change, speech change and focal weakness.  Psychiatric/Behavioral: Negative for depression and hallucinations. The patient is not nervous/anxious.    Tolerating Diet:poor Tolerating PT:   DRUG ALLERGIES:   Allergies  Allergen Reactions  . Fish-Derived Products Anaphylaxis  . Sulfamethoxazole-Trimethoprim Other (See Comments)    Caused kidney issues  . Chlorhexidine     VITALS:  Blood pressure 121/70, pulse 87, temperature 98.8 F (37.1 C), temperature source Oral, resp. rate 18, height 5\' 2"  (1.575 m), weight 86.6 kg, SpO2 99 %.  PHYSICAL EXAMINATION:   Physical Exam  GENERAL:  64 y.o.-year-old patient lying in the bed with no acute distress. Chronically ill HEENT: Head atraumatic, normocephalic. Oropharynx and nasopharynx clear. Pallor+ NECK:  Supple, no jugular venous distention. No thyroid enlargement, no tenderness.   LUNGS:decreased breath sounds bilaterally mid lungs , no wheezing, rales, rhonchi. No use of accessory muscles of respiration.  CARDIOVASCULAR: S1, S2 normal. No murmurs, rubs, or gallops.  ABDOMEN: Soft, nontender, distended. Bowel sounds present. No organomegaly or mass.  EXTREMITIES:chronic + edema b/l.       NEUROLOGIC: Cranial nerves II through XII are intact. No focal Motor or sensory deficits b/l.   PSYCHIATRIC:  patient is alert and oriented x 3.  SKIN: No obvious rash, lesion, or ulcer.   LABORATORY PANEL:  CBC Recent Labs  Lab 06/03/20 0504  WBC 15.1*  HGB 6.4*  HCT 19.5*  PLT 445*    Chemistries  Recent Labs  Lab 05/28/20 0829 06/02/20 1048 06/03/20 0504  NA 137   < > 137  K 4.6   < > 4.2  CL 102   < > 101  CO2 28   < > 27  GLUCOSE 67*   < > 80  BUN 37*   < > 46*  CREATININE 2.84*   < > 2.73*  CALCIUM 8.5*   < > 8.3*  AST 14*  --   --   ALT 9  --   --   ALKPHOS 71  --   --   BILITOT 0.5  --   --    < > = values in this interval not displayed.   Cardiac Enzymes No results for input(s): TROPONINI in the last 168 hours. RADIOLOGY:  DG Chest 2 View  Result Date: 06/02/2020 CLINICAL DATA:  Shortness of breath EXAM: CHEST - 2 VIEW COMPARISON:  March 29, 2020 FINDINGS: Stable right Port-A-Cath. No pneumothorax. Bilateral pleural effusions with underlying opacities are similar in the interval. Mild interstitial prominence. No other acute abnormalities or  changes. IMPRESSION: 1. Stable bilateral pleural effusions with underlying opacities. 2. Possible mild pulmonary edema/pulmonary venous congestion. Electronically Signed   By: Dorise Bullion III M.D   On: 06/02/2020 11:29   CT Chest Wo Contrast  Result Date: 06/02/2020 CLINICAL DATA:  64 year old female with a history of shortness of breath, metastatic breast cancer EXAM: CT CHEST WITHOUT CONTRAST TECHNIQUE: Multidetector CT imaging of the chest was performed following the standard protocol without IV  contrast. COMPARISON:  PET-CT 03/28/2020 FINDINGS: Cardiovascular: Heart size is unchanged. No pericardial fluid. Normal course caliber and contour of the thoracic aorta. No significant atherosclerotic changes. Right IJ port catheter. Mediastinum/Nodes: Unremarkable thyroid/thoracic inlet. Unremarkable thoracic esophagus. The subcarinal node node measures smaller than the comparison PET CT, previously approximately 10 mm, now 7 mm. Otherwise, no enlarged mediastinal lymph nodes. Lungs/Pleura: Bilateral pleural effusions, predominantly in the dependent aspects of the lungs. There is rim of hyperdensity adjacent to the low-density fluid at the costophrenic sulci and extending to the medial, posterior, and lateral pleural/fluid interface of the bilateral lungs. The fluid extends along the lateral lung to the anterior lung on the left, with small volume of partially trapped fluid. There is hyperdense tissue thickening along the pleural mediastinal interface of the upper mediastinum, more pronounced on the left. The appearance of the nodular thickening is relatively similar to the PET CT dated 03/28/2020. Overall, the volume of fluid appears to have slightly progressed. Nodular lung changes at the periphery of the left upper lobe on image 45 of series 3, left lower lobe on image 86 of series 2, and at the lower right lung, image 75 of series 2 and image 92 of series 2. No evidence of interlobular septal thickening. There is no confluent airspace disease. No endotracheal or endobronchial debris. Upper Abdomen: No acute finding of the upper abdomen. Musculoskeletal: Redemonstration of spiculated soft tissue nodule of the left breast, compatible with the given history of breast cancer. The more inferior aspect of the mass measures slightly larger in AP dimension on the axial CT, previously 27 mm, now 30 mm. Although sclerotic foci within the skeleton are relatively unchanged, there are new/enlarging lytic lesions of the  thoracic spine including: T5 on image 48 of series 2, with new lytic lesion adjacent to the sclerotic focus T6 at the anterior vertebral body, enlarging from the prior T7 at the right vertebral body, mixed lytic and sclerotic, enlarging from the prior T10 at the anterior vertebral body, enlarging T11, posterior vertebral body, enlarging IMPRESSION: Bilateral malignant pleural effusions, which are only slightly larger than the comparison PET CT of 03/28/2020, with associated evidence of visceral/parietal pleural metastases. Redemonstration skeletal metastases, with new/enlarging lytic lesions of the thoracic spine compatible with progression. The left breast mass appears to be enlarging compared to the prior PET. Electronically Signed   By: Corrie Mckusick D.O.   On: 06/02/2020 15:35   DG Foot Complete Left  Result Date: 06/02/2020 CLINICAL DATA:  Osteomyelitis of the left foot. EXAM: LEFT FOOT - COMPLETE 3+ VIEW COMPARISON:  April 03, 2020 FINDINGS: There is no evidence of fracture or dislocation. There is no evidence of cortical destructive lesions. Stable postsurgical changes from transmetatarsal amputation of the fifth ray. Soft tissue swelling of the midfoot. IMPRESSION: 1. No radiographic evidence of osteomyelitis. 2. Soft tissue swelling of the midfoot. 3. Stable postsurgical changes from transmetatarsal amputation of the fifth ray. Electronically Signed   By: Fidela Salisbury M.D.   On: 06/02/2020 16:51   ASSESSMENT AND PLAN:  [name] is a 64 y.o. female with medical history significant for stage IV breast cancer on chemotherapy, stage IV chronic kidney disease, depression, hypertension, GERD who presents to the emergency room for evaluation of shortness of breath mostly with exertion which she has had for about a week.    Malignant pleural effusion, Bilateral Secondary to known metastatic breast cancer -Patient presents for evaluation of exertional shortness of breath and at rest has  room air pulse oximetry of 92% which dropped to 89% with conversation -Pulse oximetry improved following oxygen supplementation at 2 L -We will consult IR for thoracentesis for symptom control -Patient may need pleurodesis or Pleurx catheter to prevent reaccumulation of pleural fluid  Metastatic breast cancer Patient with known stage IV breast cancer -CT scan of the chest without contrast shows bilateral malignant pleural effusions, which are only slightly larger than the comparison PET CT of 03/28/2020, with associated evidence of visceral/parietal pleural metastases. -Re demonstration of skeletal metastases, with new/enlarging lytic lesions of the thoracic spine compatible with progression.The left breast mass appears to be enlarging compared to the prior PET. - oncology consult with Dr. Mike Gip appreciated.  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  Hypertension -continue BUMEX amlodipine and atenolol  Depression and anxiety -Continue alprazolam and fluoxetine  History of diastolic dysfunction CHF Continue Bumex, atenolol and enalapril  Leukocytosis -Unclear etiology -UA ok, BC neg, Foot does not appear infected per Dr Luana Shu -Patient has a nonhealing left leg wound following a ray amputation of the left fifth toe for osteomyelitis -I am hold further abxs for now--no fever, Vitals ok -WBC trending down   DVT prophylaxis: Lovenox Code Status: Full code Family Communication: Greater than 50% of time was spent discussing plan of care with patient at the bedside. She verbalizes understanding and agrees with the plan. CODE STATUS was  discussed with patient request to be a full code Disposition Plan: Back to previous home environment Consults called: Oncology/podiatry/nephrology/interventional radiology         TOTAL TIME TAKING CARE OF THIS PATIENT: *25* minutes.  >50% time spent on counselling and coordination of care  Note: This dictation was prepared with Dragon dictation along with smaller phrase technology. Any transcriptional errors that result from this process are unintentional.  Fritzi Mandes M.D    [facility]   CC: Primary care physician; [name], MDPatient ID: [name], female   DOB: Dec 10, 1955, 64 y.o.   MRN: [id number]

## 2020-06-03 NOTE — Consult Note (Signed)
[facility]  Date of admission:  06/02/2020  Inpatient day:  06/03/2020  Consulting physician:  Dr Royce Macadamia Agbata.  Reason for Consultation:  Metastatic breast cancer.  Chief Complaint: [name] is a 64 y.o. female with metastatic breast cancer who was admitted through the emergency room with shortness of breath and increasing bilateral pleural effusions.  HPI: The patient has a history of stage IV left breast cancer since 05/2014.  She has been followed by Dr. Rogue Bussing.  She has received multiple treatments including Ibrance and Femara, weekly Taxol, eribulin, and recently gemcitabine (began 04/30/2020).  Patient was last seen in the medical oncology clinic by Dr. Rogue Bussing on 05/28/2020.  At that time, she received gemcitabine.  CBC revealed a hematocrit of 26.9, hemoglobin 8.8, MCV 88.8, platelets 883,000, WBC 16,700 with an ANC of 9500.  Creatinine was 2.84.  She did not receive Neulasta or steroids.  She has multifactorial anemia including chronic kidney disease and chemotherapy-induced anemia.  She last received a PRBC transfusion on 05/21/2020 for a hemoglobin of 7.5.  She has a history of left foot infection/osteomyelitis s/p amputation of the left fifth metatarsal.  Wound cultures grew MRSA and E coli.  She was treated with several weeks of IV antibiotics.  She completed oral antibiotics (ciprofloxacin and doxycycline) on 05/16/2020.  Last evaluation was stable.  Patient denies any pain, drainage, increased redness or tenderness.  Echo on 03/30/2020 revealed ejection fraction of 55-60%.  There was mild to moderate mitral valve regurgitation.    Patient states that over the past 2 weeks she has had increasing shortness of breath.  She presented to the emergency room secondary to difficulty breathing.  She states that when she moves it hurts.  She notes pain in her rib cage.  She feels fluid in her lungs.  She denies any cough or fever.  She denies any  nausea, vomiting or diarrhea.  She denies any urinary symptoms.  She recently had thrush which was treated.  Labs on presentation to the emergency room included a hematocrit of 2024.4, hemoglobin 7.9, MCV 90, platelets 596,000, WBC 20,500.  Influenza a and B were negative.  COVID-19 was negative.  BUN was 50 with a creatinine of 3.17.  CBC this morning includes a hematocrit of 19.5, hemoglobin 6.4, MCV 89.9, platelets 445,000, white count 15,100.  Urinalysis was negative.  CXR on 06/02/2020 revealed lateral pleural effusions with underlying opacities.  There was possible mild pulmonary edema/pulmonary venous congestion.  Chest CT on 06/02/2020 revealed bilateral malignant pleural effusions which are slightly larger than the PET scan from 03/28/2020 with associated evidence of visceral/parietal pleural metastasis.  There was redemonstration of skeletal metastasis with new/enlarging lytic lesions of the thoracic spine c/w progression.  The left breast mass appeared to be enlarging compared to the prior PET scan.  Pain films of the left foot on 06/02/2020 revealed no radiographic evidence of osteomyelitis.  There was soft tissue swelling of the midfoot and postsurgical changes s/p transmetatarsal amputation of the fifth toe.  Symptomatically, the patient states that she feels "fine except her breathing".  She notes some weight loss.  Appetite is diminished but is been getting better.  She denies any concerns regarding her left foot.  She denies any fevers.   Past Medical History:  Diagnosis Date  . Anemia   . Anxiety   . Asthma   . Cancer ([place]) 03/10/2018   Per NM PET order. Carcinoma of upper-inner quadrant of left breast in female, estrogen  receptor positive .  Marland Kitchen Cancer (HCC)    LUNG  . CHF (congestive heart failure) ([place]) 1997  . CKD (chronic kidney disease)   . Depression   . Diabetes mellitus, type 2 ([place])   . Family history of breast cancer   . Family history of colon cancer   . Family  history of ovarian cancer   . Family history of pancreatic cancer   . Family history of prostate cancer   . Family history of stomach cancer   . GERD (gastroesophageal reflux disease)    history of an ulcer  . Hair loss   . History of left breast cancer 05/29/14  . History of partial hysterectomy 12/31/2016   Per patient.  Has not had a period in years.  Had a partial hysterectomy years ago.  Marland Kitchen Hypertension   . Mitral valve regurgitation   . Neuromuscular disorder (HCC)    neuropathies in hand  . Obesity   . Pancreatitis 1997  . Stroke [facility]) 2010   with mild left arm weakness    Past Surgical History:  Procedure Laterality Date  . AMPUTATION Left 03/30/2020   Procedure: AMPUTATION 5th RAY;  Surgeon: Samara Deist, DPM;  Location: [facility] ORS;  Service: Podiatry;  Laterality: Left;  . APPLICATION OF WOUND VAC Left 03/30/2020   Procedure: APPLICATION OF WOUND VAC;  Surgeon: Samara Deist, DPM;  Location: [facility] ORS;  Service: Podiatry;  Laterality: Left;  . CATARACT EXTRACTION W/PHACO Right 02/24/2019   Procedure: CATARACT EXTRACTION PHACO AND INTRAOCULAR LENS PLACEMENT ([place]) RIGHT DIABETES;  Surgeon: Marchia Meiers, MD;  Location: [facility] ORS;  Service: Ophthalmology;  Laterality: Right;  [place] 01:13.0 CDE 7.96 Fluid Pack Lot # [id number]  . CATARACT EXTRACTION W/PHACO Left 03/24/2019   Procedure: CATARACT EXTRACTION PHACO AND INTRAOCULAR LENS PLACEMENT ([facility]) - left diabetic;  Surgeon: Marchia Meiers, MD;  Location: [facility] ORS;  Service: Ophthalmology;  Laterality: Left;  [place]  01:36 CDE 13.93 Fluid pack lot # [id number]  . CENTRAL LINE INSERTION-TUNNELED N/A 04/04/2020   Procedure: CENTRAL LINE INSERTION-TUNNELED;  Surgeon: Katha Cabal, MD;  Location: [place] [facility];  Service: Cardiovascular;  Laterality: N/A;  . CESAREAN SECTION    . CHOLECYSTECTOMY    . DIALYSIS/PERMA CATHETER REMOVAL N/A 05/01/2020   Procedure: DIALYSIS/PERMA CATHETER REMOVAL;  Surgeon: Katha Cabal, MD;  Location:  [place] [facility];  Service: Cardiovascular;  Laterality: N/A;  . EXCISION OF TONGUE LESION N/A 08/17/2018   Procedure: EXCISION OF TONGUE LESION WITH FROZEN SECTIONS;  Surgeon: Beverly Gust, MD;  Location: [facility] ORS;  Service: ENT;  Laterality: N/A;  . EYE SURGERY Right    cataract extraction  . IRRIGATION AND DEBRIDEMENT FOOT Left 03/30/2020   Procedure: IRRIGATION AND DEBRIDEMENT FOOT;  Surgeon: Samara Deist, DPM;  Location: [facility] ORS;  Service: Podiatry;  Laterality: Left;  . PARTIAL HYSTERECTOMY  12/31/2016   Per patient, she has not had a period in years since she had a partial hysterectomy.  Marland Kitchen PORTA CATH INSERTION    . TUBAL LIGATION      Family History  Problem Relation Age of Onset  . Ovarian cancer Mother 6  . Diabetes Mother   . Hypertension Mother   . COPD Father   . Hypertension Father   . Colon cancer Father 51  . Diabetes Sister   . Breast cancer Sister 4       bilateral  . Diabetes Brother   . Leukemia Maternal Aunt   . Pancreatic cancer Paternal Aunt  80  . Pancreatic cancer Paternal Uncle   . Colon cancer Paternal Uncle   . Stomach cancer Maternal Grandfather 60  . Throat cancer Paternal Grandmother   . Breast cancer Maternal Aunt 80  . Colon cancer Maternal Aunt   . Bone cancer Maternal Aunt   . Breast cancer Paternal Aunt        dx >50  . Prostate cancer Paternal Uncle   . Pancreatic cancer Paternal Uncle   . Throat cancer Paternal Uncle   . Lung cancer Paternal Uncle   . Stomach cancer Paternal Uncle   . Brain cancer Paternal Aunt   . Cancer Cousin        liver, kidney  . Prostate cancer Cousin        meastatic  . Lung cancer Other     Social History:  reports that she has quit smoking. Her smoking use included cigarettes. She has a 0.50 pack-year smoking history. She has never used smokeless tobacco. She reports that she does not drink alcohol and does not use drugs.  The patient denies any exposure to radiation or toxins.  The patient  lives in [place].  She is alone today.  Allergies:  Allergies  Allergen Reactions  . Fish-Derived Products Anaphylaxis  . Sulfamethoxazole-Trimethoprim Other (See Comments)    Caused kidney issues  . Chlorhexidine     (Not in a hospital admission)   Review of Systems  Constitutional: Positive for malaise/fatigue and weight loss. Negative for chills, diaphoresis and fever.       "Feels fine except for breathing".  HENT: Negative for congestion, ear pain, nosebleeds, sinus pain and sore throat.        Recent thrush, resolved.  Eyes: Negative for blurred vision, double vision, photophobia and pain.  Respiratory: Positive for shortness of breath. Negative for cough, hemoptysis, sputum production and wheezing.        Feels fluid building up in her lungs.  Cardiovascular: Negative for chest pain, palpitations, claudication, leg swelling and PND.       EF 55 to 60% on 03/30/2020.  Gastrointestinal: Negative for abdominal pain, blood in stool, constipation, diarrhea, heartburn, melena, nausea and vomiting.       Decreased appetite, but improving.  Genitourinary: Negative.  Negative for dysuria, flank pain, frequency and urgency.       Chronic kidney disease.  Musculoskeletal: Negative.  Negative for back pain, falls, joint pain, myalgias and neck pain.  Skin: Negative.  Negative for itching and rash.  Neurological: Negative for dizziness, sensory change, speech change, focal weakness, seizures, weakness and headaches.  Endo/Heme/Allergies: Negative for environmental allergies. Does not bruise/bleed easily.  Psychiatric/Behavioral: Negative for depression and memory loss. The patient is not nervous/anxious and does not have insomnia.     Vitals:  Blood pressure 113/68, pulse 79, temperature 98.1 F (36.7 C), temperature source Oral, resp. rate 18, height 5\' 2"  (1.575 m), weight 191 lb (86.6 kg), SpO2 100 %.   Physical Exam Vitals reviewed.  Constitutional:      Appearance: She is  not toxic-appearing.     Comments: Chronically ill-appearing woman sitting up in bed in the ER in no acute distress.  HENT:     Head: Normocephalic and atraumatic.     Comments: Wearing a skullcap.    Nose:     Comments: Branch in place with oxygen 3 liters/min. Eyes:     General: No scleral icterus.    Extraocular Movements: Extraocular movements intact.     Conjunctiva/sclera:  Conjunctivae normal.     Pupils: Pupils are equal, round, and reactive to light.     Comments: Brown eyes.  Neck:     Vascular: No JVD.  Cardiovascular:     Rate and Rhythm: Normal rate and regular rhythm.     Heart sounds: No murmur heard.   Pulmonary:     Effort: Pulmonary effort is normal.     Breath sounds: No wheezing, rhonchi or rales.     Comments: Decreased breath sounds 1/4-1/3 up bilaterally (right > left). Abdominal:     General: Bowel sounds are normal. There is no distension.     Palpations: Abdomen is soft. There is no mass.     Tenderness: There is no abdominal tenderness. There is no guarding or rebound.     Hernia: No hernia is present.  Musculoskeletal:     Cervical back: Normal range of motion and neck supple.     Comments: Right thumb with partial amputation.  Lymphadenopathy:     Cervical: No cervical adenopathy.  Skin:    General: Skin is warm and dry.     Coloration: Skin is not jaundiced or pale.     Comments: Chronic lower extremity changes with dry peeling skin.  Left foot wrapped in ACE bandage.  No erythema or increased warmth above dressing.  Neurological:     Mental Status: She is alert and oriented to person, place, and time.     Cranial Nerves: No cranial nerve deficit.     Sensory: No sensory deficit.  Psychiatric:        Mood and Affect: Mood normal.        Behavior: Behavior normal.        Thought Content: Thought content normal.        Judgment: Judgment normal.    Results for orders placed or performed during the hospital encounter of 06/02/20 (from the past 48  hour(s))  Basic metabolic panel     Status: Abnormal   Collection Time: 06/02/20 10:48 AM  Result Value Ref Range   Sodium 136 135 - 145 mmol/L   Potassium 4.5 3.5 - 5.1 mmol/L   Chloride 98 98 - 111 mmol/L   CO2 25 22 - 32 mmol/L   Glucose, Bld 267 (H) 70 - 99 mg/dL    Comment: Glucose reference range applies only to samples taken after fasting for at least 8 hours.   BUN 50 (H) 8 - 23 mg/dL   Creatinine, Ser 3.17 (H) 0.44 - 1.00 mg/dL   Calcium 8.5 (L) 8.9 - 10.3 mg/dL   GFR, Estimated 16 (L) >60 mL/min    Comment: (NOTE) Calculated using the CKD-EPI Creatinine Equation (2021)    Anion gap 13 5 - 15    Comment: Performed at [facility], [address] [place], [place] [address]  CBC     Status: Abnormal   Collection Time: 06/02/20 10:48 AM  Result Value Ref Range   WBC 20.5 (H) 4.0 - 10.5 K/uL   RBC 2.71 (L) 3.87 - 5.11 MIL/uL   Hemoglobin 7.9 (L) 12.0 - 15.0 g/dL   HCT 24.4 (L) 36 - 46 %   MCV 90.0 80.0 - 100.0 fL   MCH 29.2 26.0 - 34.0 pg   MCHC 32.4 30.0 - 36.0 g/dL   RDW 15.8 (H) 11.5 - 15.5 %   Platelets 596 (H) 150 - 400 K/uL   nRBC 0.0 0.0 - 0.2 %    Comment: Performed at [facility]  [facility], [place], [place] [address]  Brain natriuretic peptide     Status: Abnormal   Collection Time: 06/02/20 10:48 AM  Result Value Ref Range   B Natriuretic Peptide 336.9 (H) 0.0 - 100.0 pg/mL    Comment: Performed at [facility], [address] [place], [place] [address]  Procalcitonin - Baseline     Status: None   Collection Time: 06/02/20 10:48 AM  Result Value Ref Range   Procalcitonin 0.47 ng/mL    Comment:        Interpretation: PCT (Procalcitonin) <= 0.5 ng/mL: Systemic infection (sepsis) is not likely. Local bacterial infection is possible. (NOTE)       Sepsis PCT Algorithm           Lower Respiratory Tract                                      Infection PCT Algorithm    ----------------------------      ----------------------------         PCT < 0.25 ng/mL                PCT < 0.10 ng/mL          Strongly encourage             Strongly discourage   discontinuation of antibiotics    initiation of antibiotics    ----------------------------     -----------------------------       PCT 0.25 - 0.50 ng/mL            PCT 0.10 - 0.25 ng/mL               OR       >80% decrease in PCT            Discourage initiation of                                            antibiotics      Encourage discontinuation           of antibiotics    ----------------------------     -----------------------------         PCT >= 0.50 ng/mL              PCT 0.26 - 0.50 ng/mL               AND        <80% decrease in PCT             Encourage initiation of                                             antibiotics       Encourage continuation           of antibiotics    ----------------------------     -----------------------------        PCT >= 0.50 ng/mL                  PCT > 0.50 ng/mL               AND         increase  in PCT                  Strongly encourage                                      initiation of antibiotics    Strongly encourage escalation           of antibiotics                                     -----------------------------                                           PCT <= 0.25 ng/mL                                                 OR                                        > 80% decrease in PCT                                      Discontinue / Do not initiate                                             antibiotics  Performed at [facility], [address] [place], [place] [address]   Respiratory Panel by RT PCR (Flu A&B, Covid) - Nasopharyngeal Swab     Status: None   Collection Time: 06/02/20  2:08 PM   Specimen: Nasopharyngeal Swab  Result Value Ref Range   SARS Coronavirus 2 by RT PCR NEGATIVE NEGATIVE    Comment: (NOTE) SARS-CoV-2 target nucleic acids are NOT  DETECTED.  The SARS-CoV-2 RNA is generally detectable in upper respiratoy specimens during the acute phase of infection. The lowest concentration of SARS-CoV-2 viral copies this assay can detect is 131 copies/mL. A negative result does not preclude SARS-Cov-2 infection and should not be used as the sole basis for treatment or other patient management decisions. A negative result may occur with  improper specimen collection/handling, submission of specimen other than nasopharyngeal swab, presence of viral mutation(s) within the areas targeted by this assay, and inadequate number of viral copies (<131 copies/mL). A negative result must be combined with clinical observations, patient history, and epidemiological information. The expected result is Negative.  Fact Sheet for Patients:  PinkCheek.be  Fact Sheet for Healthcare Providers:  GravelBags.it  This test is no t yet approved or cleared by the [place] FDA and  has been authorized for detection and/or diagnosis of SARS-CoV-2 by FDA under an Emergency Use Authorization (EUA). This EUA will remain  in effect (meaning this test can be used) for the duration of the COVID-19 declaration under Section 564(b)(1) of the Act,  21 U.S.C. section 360bbb-3(b)(1), unless the authorization is terminated or revoked sooner.     Influenza A by PCR NEGATIVE NEGATIVE   Influenza B by PCR NEGATIVE NEGATIVE    Comment: (NOTE) The Xpert Xpress SARS-CoV-2/FLU/RSV assay is intended as an aid in  the diagnosis of influenza from Nasopharyngeal swab specimens and  should not be used as a sole basis for treatment. Nasal washings and  aspirates are unacceptable for Xpert Xpress SARS-CoV-2/FLU/RSV  testing.  Fact Sheet for Patients: PinkCheek.be  Fact Sheet for Healthcare Providers: GravelBags.it  This test is not yet approved or  cleared by the [place] FDA and  has been authorized for detection and/or diagnosis of SARS-CoV-2 by  FDA under an Emergency Use Authorization (EUA). This EUA will remain  in effect (meaning this test can be used) for the duration of the  Covid-19 declaration under Section 564(b)(1) of the Act, 21  U.S.C. section 360bbb-3(b)(1), unless the authorization is  terminated or revoked. Performed at [facility], [address] [place], [place] [address]   Culture, blood (Routine X 2) w Reflex to ID Panel     Status: None (Preliminary result)   Collection Time: 06/02/20  4:32 PM   Specimen: BLOOD  Result Value Ref Range   Specimen Description BLOOD RIGHT PORT    Special Requests      BOTTLES DRAWN AEROBIC AND ANAEROBIC Blood Culture adequate volume   Culture      NO GROWTH < 12 HOURS Performed at [facility], [address], [place], [place] [address]    Report Status PENDING   Culture, blood (Routine X 2) w Reflex to ID Panel     Status: None (Preliminary result)   Collection Time: 06/02/20  6:00 PM   Specimen: BLOOD  Result Value Ref Range   Specimen Description BLOOD RIGHT PORT    Special Requests      BOTTLES DRAWN AEROBIC AND ANAEROBIC Blood Culture adequate volume   Culture      NO GROWTH < 12 HOURS Performed at [facility], [address], [place], [place] [address]    Report Status PENDING   Urinalysis, Complete w Microscopic     Status: Abnormal   Collection Time: 06/02/20  6:00 PM  Result Value Ref Range   Color, Urine YELLOW (A) YELLOW   APPearance HAZY (A) CLEAR   Specific Gravity, Urine 1.010 1.005 - 1.030   pH 6.0 5.0 - 8.0   Glucose, UA 50 (A) NEGATIVE mg/dL   Hgb urine dipstick NEGATIVE NEGATIVE   Bilirubin Urine NEGATIVE NEGATIVE   Ketones, ur NEGATIVE NEGATIVE mg/dL   Protein, ur 100 (A) NEGATIVE mg/dL   Nitrite NEGATIVE NEGATIVE   Leukocytes,Ua NEGATIVE NEGATIVE   RBC / HPF 0-5 0 - 5 RBC/hpf   WBC, UA 0-5 0 - 5 WBC/hpf    Bacteria, UA NONE SEEN NONE SEEN   Squamous Epithelial / LPF 6-10 0 - 5    Comment: Performed at [facility], [address] [place], [place] [address]  CBG monitoring, ED     Status: Abnormal   Collection Time: 06/02/20  8:11 PM  Result Value Ref Range   Glucose-Capillary 132 (H) 70 - 99 mg/dL    Comment: Glucose reference range applies only to samples taken after fasting for at least 8 hours.  CBG monitoring, ED     Status: Abnormal   Collection Time: 06/02/20 10:59 PM  Result Value Ref Range   Glucose-Capillary 101 (H)  70 - 99 mg/dL    Comment: Glucose reference range applies only to samples taken after fasting for at least 8 hours.  Basic metabolic panel     Status: Abnormal   Collection Time: 06/03/20  5:04 AM  Result Value Ref Range   Sodium 137 135 - 145 mmol/L   Potassium 4.2 3.5 - 5.1 mmol/L   Chloride 101 98 - 111 mmol/L   CO2 27 22 - 32 mmol/L   Glucose, Bld 80 70 - 99 mg/dL    Comment: Glucose reference range applies only to samples taken after fasting for at least 8 hours.   BUN 46 (H) 8 - 23 mg/dL   Creatinine, Ser 2.73 (H) 0.44 - 1.00 mg/dL   Calcium 8.3 (L) 8.9 - 10.3 mg/dL   GFR, Estimated 19 (L) >60 mL/min    Comment: (NOTE) Calculated using the CKD-EPI Creatinine Equation (2021)    Anion gap 9 5 - 15    Comment: Performed at [facility], [address] [place], [place] [address]  CBC     Status: Abnormal   Collection Time: 06/03/20  5:04 AM  Result Value Ref Range   WBC 15.1 (H) 4.0 - 10.5 K/uL   RBC 2.17 (L) 3.87 - 5.11 MIL/uL   Hemoglobin 6.4 (L) 12.0 - 15.0 g/dL   HCT 19.5 (L) 36 - 46 %   MCV 89.9 80.0 - 100.0 fL   MCH 29.5 26.0 - 34.0 pg   MCHC 32.8 30.0 - 36.0 g/dL   RDW 15.7 (H) 11.5 - 15.5 %   Platelets 445 (H) 150 - 400 K/uL   nRBC 0.3 (H) 0.0 - 0.2 %    Comment: Performed at [facility], [address], [place], [place] [address]   DG Chest 2 View  Result Date: 06/02/2020 CLINICAL DATA:  Shortness of  breath EXAM: CHEST - 2 VIEW COMPARISON:  March 29, 2020 FINDINGS: Stable right Port-A-Cath. No pneumothorax. Bilateral pleural effusions with underlying opacities are similar in the interval. Mild interstitial prominence. No other acute abnormalities or changes. IMPRESSION: 1. Stable bilateral pleural effusions with underlying opacities. 2. Possible mild pulmonary edema/pulmonary venous congestion. Electronically Signed   By: Dorise Bullion III M.D   On: 06/02/2020 11:29   CT Chest Wo Contrast  Result Date: 06/02/2020 CLINICAL DATA:  64 year old female with a history of shortness of breath, metastatic breast cancer EXAM: CT CHEST WITHOUT CONTRAST TECHNIQUE: Multidetector CT imaging of the chest was performed following the standard protocol without IV contrast. COMPARISON:  PET-CT 03/28/2020 FINDINGS: Cardiovascular: Heart size is unchanged. No pericardial fluid. Normal course caliber and contour of the thoracic aorta. No significant atherosclerotic changes. Right IJ port catheter. Mediastinum/Nodes: Unremarkable thyroid/thoracic inlet. Unremarkable thoracic esophagus. The subcarinal node node measures smaller than the comparison PET CT, previously approximately 10 mm, now 7 mm. Otherwise, no enlarged mediastinal lymph nodes. Lungs/Pleura: Bilateral pleural effusions, predominantly in the dependent aspects of the lungs. There is rim of hyperdensity adjacent to the low-density fluid at the costophrenic sulci and extending to the medial, posterior, and lateral pleural/fluid interface of the bilateral lungs. The fluid extends along the lateral lung to the anterior lung on the left, with small volume of partially trapped fluid. There is hyperdense tissue thickening along the pleural mediastinal interface of the upper mediastinum, more pronounced on the left. The appearance of the nodular thickening is relatively similar to the PET CT dated 03/28/2020. Overall, the volume of fluid appears to have slightly  progressed.  Nodular lung changes at the periphery of the left upper lobe on image 45 of series 3, left lower lobe on image 86 of series 2, and at the lower right lung, image 75 of series 2 and image 92 of series 2. No evidence of interlobular septal thickening. There is no confluent airspace disease. No endotracheal or endobronchial debris. Upper Abdomen: No acute finding of the upper abdomen. Musculoskeletal: Redemonstration of spiculated soft tissue nodule of the left breast, compatible with the given history of breast cancer. The more inferior aspect of the mass measures slightly larger in AP dimension on the axial CT, previously 27 mm, now 30 mm. Although sclerotic foci within the skeleton are relatively unchanged, there are new/enlarging lytic lesions of the thoracic spine including: T5 on image 48 of series 2, with new lytic lesion adjacent to the sclerotic focus T6 at the anterior vertebral body, enlarging from the prior T7 at the right vertebral body, mixed lytic and sclerotic, enlarging from the prior T10 at the anterior vertebral body, enlarging T11, posterior vertebral body, enlarging IMPRESSION: Bilateral malignant pleural effusions, which are only slightly larger than the comparison PET CT of 03/28/2020, with associated evidence of visceral/parietal pleural metastases. Redemonstration skeletal metastases, with new/enlarging lytic lesions of the thoracic spine compatible with progression. The left breast mass appears to be enlarging compared to the prior PET. Electronically Signed   By: Corrie Mckusick D.O.   On: 06/02/2020 15:35   DG Foot Complete Left  Result Date: 06/02/2020 CLINICAL DATA:  Osteomyelitis of the left foot. EXAM: LEFT FOOT - COMPLETE 3+ VIEW COMPARISON:  April 03, 2020 FINDINGS: There is no evidence of fracture or dislocation. There is no evidence of cortical destructive lesions. Stable postsurgical changes from transmetatarsal amputation of the fifth ray. Soft tissue swelling of  the midfoot. IMPRESSION: 1. No radiographic evidence of osteomyelitis. 2. Soft tissue swelling of the midfoot. 3. Stable postsurgical changes from transmetatarsal amputation of the fifth ray. Electronically Signed   By: Fidela Salisbury M.D.   On: 06/02/2020 16:51    Assessment:  The patient is a 64 y.o. woman with metastatic breast cancer admitted with shortness of breath secondary to bilateral pleural effusions likely secondary to malignant disease.  She began gemcitabine on 04/30/2020 (last 05/28/2020).  PET scan on 03/28/2020 revealed progressive pleural parenchymal and osseous metastatic disease.  There was a new hypermetabolic lymph node in the subcarinal station and left external iliac chain.  Left breast mass appeared similar there were moderate right and small left pleural effusions increased with the associated pleural thickening.  Chest CT on 06/02/2020 revealed bilateral malignant pleural effusions which are slightly larger than the PET scan from 03/28/2020 with associated evidence of visceral/parietal pleural metastasis.  There was redemonstration of skeletal metastasis with new/enlarging lytic lesions of the thoracic spine c/w progression.  The left breast mass appeared to be enlarging compared to the prior PET scan.  She has a history of osteomyelitis of the 5th metatarsal.  Cultures grew MRSA and E coli.  Plain films of the left foot on 06/02/2020 revealed no radiographic evidence of osteomyelitis.  There was soft tissue swelling of the midfoot and postsurgical changes s/p transmetatarsal amputation of the fifth toe.  She has multifactorial anemia likely secondary to anemia of chronic renal disease and chemotherapy-induced anemia.  Symptomatically, she notes a 2-week history of progressive shortness of breath.  Exam reveals decreased breath sounds 1/4- 1/3 way up bilaterally.  Plan:   1.   Metastatic breast cancer  Patient has a longstanding history of metastatic breast cancer  dating back to 70.  She has received multiple treatments.  She began gemcitabine on 04/30/2020 (last 05/28/2020).  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  Chest CT personally reviewed.  Pleural effusions have increased c/w her symptoms.  We discussed the plan for thoracentesis.  She will likely need long term management with Pleurx catheter vs pleurodesis. 2.   Pleural effusions  Etiology likely secondary to metastatic breast cancer.  Please send pleural fluid for cytology well as glucose, protein, cell count and differential and culture. 3.   Leukocytosis  WBC 20,500 to 15,100 overnight.  Etiology unclear but concern for infection.  She has a history of osteomyelitis of the left foot.   Plain films are negative.     Consider follow-up in hospital with Dr Ola Spurr (last seen 05/09/2020).   Urine culture is negative.  Send pleural fluid for culture. Blood cultures are pending.  Patient on broad-spectrum antibiotics (vancomycin and ceftriaxone) 4.  Multifactorial anemia  Hemogobin 6.4 today.    Patient has anemia of chronic renal disease and chemotherapy-induced anemia.  She last received a PRBC transfusion on 05/21/2020 for hemoglobin of 7.5.  Patient denies any bleeding.  Discussed plans for transfusion.  Patient in agreement.  Maintain type and screen. 5.   Code status  Discussed with patient.  Patient currently FULL code.  Thank you for allowing me to participate in [place] 's care.  I will follow her closely with you until Dr Aletha Halim return on Monday, 06/04/2020.   Lequita Asal, MD  06/03/2020, 7:57 AM

## 2020-06-03 NOTE — ED Notes (Signed)
Advised nurse that patient has assigned bed 

## 2020-06-04 ENCOUNTER — Inpatient Hospital Stay: Payer: Medicare HMO

## 2020-06-04 ENCOUNTER — Telehealth: Payer: Self-pay | Admitting: Internal Medicine

## 2020-06-04 ENCOUNTER — Inpatient Hospital Stay: Payer: Medicare HMO | Admitting: Internal Medicine

## 2020-06-04 DIAGNOSIS — Z515 Encounter for palliative care: Secondary | ICD-10-CM

## 2020-06-04 DIAGNOSIS — J910 Malignant pleural effusion: Secondary | ICD-10-CM | POA: Diagnosis not present

## 2020-06-04 DIAGNOSIS — C50212 Malignant neoplasm of upper-inner quadrant of left female breast: Principal | ICD-10-CM

## 2020-06-04 DIAGNOSIS — C7801 Secondary malignant neoplasm of right lung: Secondary | ICD-10-CM

## 2020-06-04 DIAGNOSIS — C7802 Secondary malignant neoplasm of left lung: Secondary | ICD-10-CM

## 2020-06-04 LAB — URINE CULTURE

## 2020-06-04 LAB — CBC WITH DIFFERENTIAL/PLATELET
Abs Immature Granulocytes: 0.57 10*3/uL — ABNORMAL HIGH (ref 0.00–0.07)
Basophils Absolute: 0 10*3/uL (ref 0.0–0.1)
Basophils Relative: 0 %
Eosinophils Absolute: 0.1 10*3/uL (ref 0.0–0.5)
Eosinophils Relative: 1 %
HCT: 23.3 % — ABNORMAL LOW (ref 36.0–46.0)
Hemoglobin: 7.6 g/dL — ABNORMAL LOW (ref 12.0–15.0)
Immature Granulocytes: 9 %
Lymphocytes Relative: 8 %
Lymphs Abs: 0.5 10*3/uL — ABNORMAL LOW (ref 0.7–4.0)
MCH: 29.3 pg (ref 26.0–34.0)
MCHC: 32.6 g/dL (ref 30.0–36.0)
MCV: 90 fL (ref 80.0–100.0)
Monocytes Absolute: 0.8 10*3/uL (ref 0.1–1.0)
Monocytes Relative: 12 %
Neutro Abs: 4.6 10*3/uL (ref 1.7–7.7)
Neutrophils Relative %: 70 %
Platelets: 417 10*3/uL — ABNORMAL HIGH (ref 150–400)
RBC: 2.59 MIL/uL — ABNORMAL LOW (ref 3.87–5.11)
RDW: 15.9 % — ABNORMAL HIGH (ref 11.5–15.5)
WBC: 6.6 10*3/uL (ref 4.0–10.5)
nRBC: 1.2 % — ABNORMAL HIGH (ref 0.0–0.2)

## 2020-06-04 LAB — BODY FLUID CELL COUNT WITH DIFFERENTIAL
Eos, Fluid: 1 %
Lymphs, Fluid: 21 %
Monocyte-Macrophage-Serous Fluid: 12 % — ABNORMAL LOW (ref 50–90)
Neutrophil Count, Fluid: 66 % — ABNORMAL HIGH (ref 0–25)
Other Cells, Fluid: 0 %
Total Nucleated Cell Count, Fluid: 92 cu mm (ref 0–1000)

## 2020-06-04 LAB — GLUCOSE, CAPILLARY
Glucose-Capillary: 170 mg/dL — ABNORMAL HIGH (ref 70–99)
Glucose-Capillary: 215 mg/dL — ABNORMAL HIGH (ref 70–99)
Glucose-Capillary: 142 mg/dL — ABNORMAL HIGH (ref 70–99)
Glucose-Capillary: 109 mg/dL — ABNORMAL HIGH (ref 70–99)

## 2020-06-04 LAB — PATHOLOGIST SMEAR REVIEW

## 2020-06-04 LAB — HEMOGLOBIN AND HEMATOCRIT, BLOOD
HCT: 29.7 % — ABNORMAL LOW (ref 36.0–46.0)
Hemoglobin: 9.2 g/dL — ABNORMAL LOW (ref 12.0–15.0)

## 2020-06-04 LAB — ALBUMIN, PLEURAL OR PERITONEAL FLUID: Albumin, Fluid: 1.5 g/dL

## 2020-06-04 LAB — PREPARE RBC (CROSSMATCH)

## 2020-06-04 LAB — LACTATE DEHYDROGENASE, PLEURAL OR PERITONEAL FLUID: LD, Fluid: 201 U/L — ABNORMAL HIGH (ref 3–23)

## 2020-06-04 LAB — CREATININE, SERUM
Creatinine, Ser: 2.42 mg/dL — ABNORMAL HIGH (ref 0.44–1.00)
GFR, Estimated: 22 mL/min — ABNORMAL LOW (ref >60)

## 2020-06-04 LAB — SEDIMENTATION RATE: Sed Rate: 128 mm/hr — ABNORMAL HIGH (ref 0–30)

## 2020-06-04 LAB — C-REACTIVE PROTEIN: CRP: 17.5 mg/dL — ABNORMAL HIGH (ref <1.0)

## 2020-06-04 LAB — AMYLASE, PLEURAL OR PERITONEAL FLUID: Amylase, Fluid: 28 U/L

## 2020-06-04 LAB — PROTEIN, PLEURAL OR PERITONEAL FLUID: Total protein, fluid: 3.6 g/dL

## 2020-06-04 MED ORDER — FAMOTIDINE 20 MG PO TABS
20.0000 mg | ORAL_TABLET | Freq: Every day | ORAL | Status: DC
  Administered 2020-06-05 – 2020-06-06 (×2): 20 mg via ORAL
  Filled 2020-06-04 (×2): qty 1

## 2020-06-04 MED ORDER — SODIUM CHLORIDE 0.9% IV SOLUTION: Freq: Once | INTRAVENOUS | Status: AC

## 2020-06-04 NOTE — Progress Notes (Signed)
Unable to complete dressing change to Left foot d/t no silver alginate on floor; service response called to have orderly bring supplies; awaiting supplies. Barbaraann Faster, RN 7:20 AM; 06/04/2020

## 2020-06-04 NOTE — Progress Notes (Addendum)
[name]   DOB:06-Sep-1955   [id number]    Subjective: Patient sitting in a chair; on 3 L of oxygen.  Complains of shortness of breath on exertion.  No cough.  No fevers.  No chills.  She feels slightly improved since admission to hospital s/p PRBC transfusion.  Objective:  Vitals:   06/04/20 0800 06/04/20 0932  BP: 132/87 121/77  Pulse: 77   Resp: 20   Temp: 97.7 F (36.5 C)   SpO2: 100% 100%     Intake/Output Summary (Last 24 hours) at 06/04/2020 0944 Last data filed at 06/04/2020 0700 Gross per 24 hour  Intake 291 ml  Output 300 ml  Net -9 ml    Physical Exam Constitutional:      Comments: Patient resting in the chair.  3 L of oxygen.  Comfortable.  Alone.  HENT:     Head: Normocephalic and atraumatic.     Mouth/Throat:     Pharynx: No oropharyngeal exudate.  Eyes:     Pupils: Pupils are equal, round, and reactive to light.  Cardiovascular:     Rate and Rhythm: Normal rate and regular rhythm.  Pulmonary:     Effort: No respiratory distress.     Breath sounds: No wheezing.     Comments: Decreased air entry bilaterally. Abdominal:     General: Bowel sounds are normal. There is no distension.     Palpations: Abdomen is soft. There is no mass.     Tenderness: There is no abdominal tenderness. There is no guarding or rebound.  Musculoskeletal:        General: No tenderness. Normal range of motion.     Cervical back: Normal range of motion and neck supple.     Comments: Left lower extremity bandaged.  Skin:    General: Skin is warm.  Neurological:     Mental Status: She is alert and oriented to person, place, and time.  Psychiatric:        Mood and Affect: Affect normal.      Labs:  Lab Results  Component Value Date   WBC 6.6 06/04/2020   HGB 7.6 (L) 06/04/2020   HCT 23.3 (L) 06/04/2020   MCV 90.0 06/04/2020   PLT 417 (H) 06/04/2020   NEUTROABS 4.6 06/04/2020    Lab Results  Component Value Date   NA 137 06/03/2020   K 4.2 06/03/2020   CL 101  06/03/2020   CO2 27 06/03/2020    Studies:  DG Chest 2 View  Result Date: 06/02/2020 CLINICAL DATA:  Shortness of breath EXAM: CHEST - 2 VIEW COMPARISON:  March 29, 2020 FINDINGS: Stable right Port-A-Cath. No pneumothorax. Bilateral pleural effusions with underlying opacities are similar in the interval. Mild interstitial prominence. No other acute abnormalities or changes. IMPRESSION: 1. Stable bilateral pleural effusions with underlying opacities. 2. Possible mild pulmonary edema/pulmonary venous congestion. Electronically Signed   By: Dorise Bullion III M.D   On: 06/02/2020 11:29   CT Chest Wo Contrast  Result Date: 06/02/2020 CLINICAL DATA:  64 year old female with a history of shortness of breath, metastatic breast cancer EXAM: CT CHEST WITHOUT CONTRAST TECHNIQUE: Multidetector CT imaging of the chest was performed following the standard protocol without IV contrast. COMPARISON:  PET-CT 03/28/2020 FINDINGS: Cardiovascular: Heart size is unchanged. No pericardial fluid. Normal course caliber and contour of the thoracic aorta. No significant atherosclerotic changes. Right IJ port catheter. Mediastinum/Nodes: Unremarkable thyroid/thoracic inlet. Unremarkable thoracic esophagus. The subcarinal node node measures smaller than the comparison PET  CT, previously approximately 10 mm, now 7 mm. Otherwise, no enlarged mediastinal lymph nodes. Lungs/Pleura: Bilateral pleural effusions, predominantly in the dependent aspects of the lungs. There is rim of hyperdensity adjacent to the low-density fluid at the costophrenic sulci and extending to the medial, posterior, and lateral pleural/fluid interface of the bilateral lungs. The fluid extends along the lateral lung to the anterior lung on the left, with small volume of partially trapped fluid. There is hyperdense tissue thickening along the pleural mediastinal interface of the upper mediastinum, more pronounced on the left. The appearance of the nodular  thickening is relatively similar to the PET CT dated 03/28/2020. Overall, the volume of fluid appears to have slightly progressed. Nodular lung changes at the periphery of the left upper lobe on image 45 of series 3, left lower lobe on image 86 of series 2, and at the lower right lung, image 75 of series 2 and image 92 of series 2. No evidence of interlobular septal thickening. There is no confluent airspace disease. No endotracheal or endobronchial debris. Upper Abdomen: No acute finding of the upper abdomen. Musculoskeletal: Redemonstration of spiculated soft tissue nodule of the left breast, compatible with the given history of breast cancer. The more inferior aspect of the mass measures slightly larger in AP dimension on the axial CT, previously 27 mm, now 30 mm. Although sclerotic foci within the skeleton are relatively unchanged, there are new/enlarging lytic lesions of the thoracic spine including: T5 on image 48 of series 2, with new lytic lesion adjacent to the sclerotic focus T6 at the anterior vertebral body, enlarging from the prior T7 at the right vertebral body, mixed lytic and sclerotic, enlarging from the prior T10 at the anterior vertebral body, enlarging T11, posterior vertebral body, enlarging IMPRESSION: Bilateral malignant pleural effusions, which are only slightly larger than the comparison PET CT of 03/28/2020, with associated evidence of visceral/parietal pleural metastases. Redemonstration skeletal metastases, with new/enlarging lytic lesions of the thoracic spine compatible with progression. The left breast mass appears to be enlarging compared to the prior PET. Electronically Signed   By: Corrie Mckusick D.O.   On: 06/02/2020 15:35   DG Foot Complete Left  Result Date: 06/02/2020 CLINICAL DATA:  Osteomyelitis of the left foot. EXAM: LEFT FOOT - COMPLETE 3+ VIEW COMPARISON:  April 03, 2020 FINDINGS: There is no evidence of fracture or dislocation. There is no evidence of cortical  destructive lesions. Stable postsurgical changes from transmetatarsal amputation of the fifth ray. Soft tissue swelling of the midfoot. IMPRESSION: 1. No radiographic evidence of osteomyelitis. 2. Soft tissue swelling of the midfoot. 3. Stable postsurgical changes from transmetatarsal amputation of the fifth ray. Electronically Signed   By: Fidela Salisbury M.D.   On: 06/02/2020 16:51    Carcinoma of upper-inner quadrant of left breast in female, estrogen receptor positive ([place]) #64 year old female patient with multiple medical problems history of metastatic stage IV breast cancer ER/PR positive HER-2 negative status post multiple lines of therapy-most recent gemcitabine currently admitted hospital for worsening shortness of breath  #Worsening shortness of breath-multifactorial severe anemia hemoglobin 6.5; bilateral worsening pleural effusion [see below]; proceed with thoracentesis today.  Discussed with patient that unfortunately given the absence of significant effusion unlikely to give any significant clinical improvement.also given the likely malignant nature of the effusion-unlikely any long-term benefits.  #Metastatic left breast cancer ER/PR +, HER-2/neu-currently on gemcitabine chemotherapy s/p cycle number 2-day 1.  October 30 CT scan unfortunately-shows progression of disease-pleural metastases/bilateral effusion; worsening lytic lesions in  the bones.  See below  # Anemia-hemoglobin-nadir 6.7 s/p PRBC transfusion hemoglobin today 7.5.  Proceed with PRBC transfusion today.  PRBC on transfusion ordered.  # Chronic kidney disease - stage IV [GFR-22]-stable  # Bone mets-sclerotic; Right acetabular uptake-s/p radiation; CT scan shows worsening metastases in the vertebrae; asymptomatic.  See below  #Leukocytosis-up to 20,000 on admission-question infection on broad-spectrum antibiotics.  Appreciate evaluation by podiatry-no obvious evidence of worsening foot infection or osteomyelitis.   Leukocytosis improving.  #Prognosis/treatment plan: A long discussion with patient regarding the progressive nature of the disease; especially context of her poor tolerance to therapy-severe anemia/risk of infections/CKD stage IV etc.  Discussed that we are in a difficult situation where the benefits of chemotherapy seem to be outweighed by the risk of chemotherapy.  However patient states that she is "not ready to give up".  I would recommend palliative care evaluation.  Discussed with Josh.  We will also reach out to patient's son, Lanny Hurst today.   # 40 minutes face-to-face with the patient discussing the above plan of care; more than 50% of time spent on prognosis/ natural history; counseling and coordination.    Cammie Sickle, MD 06/04/2020  9:44 AM

## 2020-06-04 NOTE — Consult Note (Signed)
[name]  [phone] [phone]   Name: [name] Date: 06/04/2020 MRN: [id number]  DOB: 1956-06-01  Patient Care Team: Gloria Harrier, [name] Gloria Sickle, MD as Medical Oncologist (Medical Oncology) Gloria Skains, MD as Consulting Physician (Cardiology)    REASON FOR CONSULTATION: [name] is a 64 y.o. female with multiple medical problems including stage IV breast cancer widely metastatic to lungs and bone on multiple previous lines of chemotherapy, stage IV CKD, anemia, history of CVA, and diabetes.  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  Patient is now hospitalized 06/02/2020 with shortness of breath.  She was found to have acute on chronic anemia.  CT of the chest on 06/02/2020 revealed bilateral malignant pleural effusions and worsening lytic lesions in her thoracic spine consistent with disease progression.  Patient is status post left-sided thoracentesis on 11/1 with 500 mL of fluid removed.  Palliative care was consulted up address goals.  SOCIAL HISTORY:     reports that she has quit smoking. Her smoking use included cigarettes. She has a 0.50 pack-year smoking history. She has never used smokeless tobacco. She reports that she does not drink alcohol and does not use drugs.   Patient is unmarried.  She lives at home with her fianc.  She has a son who lives nearby.  She has another son in [place] and a daughter in [place], [place].  Patient held a variety of jobs in her lifetime and mostly worked in [place].  ADVANCE DIRECTIVES:  Does not have  CODE STATUS: Full code  PAST MEDICAL HISTORY: Past Medical History:  Diagnosis Date   Anemia    Anxiety    Asthma    Cancer ([place]) 03/10/2018   Per NM PET order.  Carcinoma of upper-inner quadrant of left breast in female, estrogen receptor positive .   Cancer ([place])    LUNG   CHF (congestive heart failure) ([place]) 1997   CKD (chronic kidney disease)    Depression    Diabetes mellitus, type 2 ([place])    Family history of breast cancer    Family history of colon cancer    Family history of ovarian cancer    Family history of pancreatic cancer    Family history of prostate cancer    Family history of stomach cancer    GERD (gastroesophageal reflux disease)    history of an ulcer   Hair loss    History of left breast cancer 05/29/14   History of partial hysterectomy 12/31/2016   Per patient.  Has not had a period in years.  Had a partial hysterectomy years ago.   Hypertension    Mitral valve regurgitation    Neuromuscular disorder (HCC)    neuropathies in hand   Obesity    Pancreatitis 1997   Stroke [facility]) 2010   with mild left arm weakness    PAST SURGICAL HISTORY:  Past Surgical History:  Procedure Laterality Date   AMPUTATION Left 03/30/2020   Procedure: AMPUTATION 5th RAY;  Surgeon: Samara Deist, DPM;  Location: [facility] ORS;  Service: Podiatry;  Laterality: Left;   APPLICATION OF WOUND VAC Left 03/30/2020   Procedure: APPLICATION OF WOUND VAC;  Surgeon: Samara Deist, DPM;  Location: [facility] ORS;  Service: Podiatry;  Laterality: Left;   CATARACT EXTRACTION W/PHACO Right 02/24/2019  Procedure: CATARACT EXTRACTION PHACO AND INTRAOCULAR LENS PLACEMENT ([place]) RIGHT DIABETES;  Surgeon: Marchia Meiers, MD;  Location: [facility] ORS;  Service: Ophthalmology;  Laterality: Right;  [place] 01:13.0 CDE 7.96 Fluid Pack Lot # [id number]   CATARACT EXTRACTION W/PHACO Left 03/24/2019   Procedure: CATARACT EXTRACTION PHACO AND INTRAOCULAR LENS PLACEMENT ([facility]) - left diabetic;  Surgeon: Marchia Meiers, MD;  Location: [facility] ORS;  Service: Ophthalmology;  Laterality: Left;  [place]  01:36 CDE 13.93 Fluid pack lot # [id number]   CENTRAL LINE INSERTION-TUNNELED  N/A 04/04/2020   Procedure: CENTRAL LINE INSERTION-TUNNELED;  Surgeon: Katha Cabal, MD;  Location: [place] CV LAB;  Service: Cardiovascular;  Laterality: N/A;   CESAREAN SECTION     CHOLECYSTECTOMY     DIALYSIS/PERMA CATHETER REMOVAL N/A 05/01/2020   Procedure: DIALYSIS/PERMA CATHETER REMOVAL;  Surgeon: Katha Cabal, MD;  Location: [place] CV LAB;  Service: Cardiovascular;  Laterality: N/A;   EXCISION OF TONGUE LESION N/A 08/17/2018   Procedure: EXCISION OF TONGUE LESION WITH FROZEN SECTIONS;  Surgeon: Beverly Gust, MD;  Location: [facility] ORS;  Service: ENT;  Laterality: N/A;   EYE SURGERY Right    cataract extraction   IRRIGATION AND DEBRIDEMENT FOOT Left 03/30/2020   Procedure: IRRIGATION AND DEBRIDEMENT FOOT;  Surgeon: Samara Deist, DPM;  Location: [facility] ORS;  Service: Podiatry;  Laterality: Left;   PARTIAL HYSTERECTOMY  12/31/2016   Per patient, she has not had a period in years since she had a partial hysterectomy.   PORTA CATH INSERTION     TUBAL LIGATION      HEMATOLOGY/ONCOLOGY HISTORY:  Oncology History Overview Note  # OCT 2015-STAGE IV LEFT BREAST T2N1 [T=4cm; [name]; [name]; [name]; EBUS- Positive Paratrac/subcarinal LN s/p ? Taxotere [in [place]; [name]; [name];[name]; [name]; suv 3]; [name]; [name]; [name];  [name]; Ant R iliac bone; [name]; [name]; START-Taxol weekly. March 2020- Taxol every 2 weeks [PN]; SEP 2020- PET progression  # SEP 04/29/2019- ERIBULIN  s/p RT - Right hip- [s/p RT- NOV 2020]; AUG 2021-PET scan progressive disease ; rising tumor markers.  Discontinued Eribulin- AUG 2021 [left foot osteomyelitis s/p toe amputation; Dr. Antonietta Barcelona. [place]];  # SEP [id number]- GEMCIATBINE [884m/m2]  # Poorly controlled Blood sugars- improved.   # Pancreatitis Hx/PEI- on creon in past / CKD IV [creat ~ 3-4; [name]; [name]; mild left sided weakness]  # Jan 2020-  Lobular lesion on tongue- s/p excision pyogenic granuloma [Dr.McQueen]   # GENETIC TESTING/COUNSELLING: HETEROZYGOUS Cystic Fibrosis Gene [explains hx of recurrent pancreatitis]  # MOLECULAR TESTING: NA   # PALLIATIVE CARE: 1/22-Discussed/Declined ------------------------------------------------   DIAGNOSIS: [ 22[address]]BREAST CA; [name]; [name] ;GOALS: Palliative  CURRENT/MOST RECENT THERAPY: GEMCITABINE[C].     Carcinoma of upper-inner quadrant of left breast in female, estrogen receptor positive [place]  04/29/2019 - 02/24/2020 Chemotherapy   The patient had eriBULin mesylate (HALAVEN) 2 mg in sodium chloride 0.9 % 100 mL chemo infusion, 2 mg, Intravenous,  Once, 12 of 14 cycles Dose modification: 1 mg/m2 (original dose  1 mg/m2, Cycle 1, Reason: Provider Judgment) Administration: 2 mg (06/03/2019), 2 mg (04/29/2019), 2 mg (06/10/2019), 2 mg (07/04/2019), 2 mg (07/11/2019), 2 mg (07/25/2019), 2 mg (08/03/2019), 2 mg (08/19/2019), 2 mg (08/26/2019), 2 mg (09/09/2019), 2 mg (09/16/2019), 2 mg (10/07/2019), 2 mg (10/14/2019), 2 mg (10/28/2019), 2 mg (11/04/2019), 2 mg (11/28/2019), 2 mg (12/09/2019), 2 mg (01/13/2020), 2 mg (01/20/2020), 2 mg (02/02/2020), 2 mg (02/13/2020), 2 mg (02/24/2020)  for chemotherapy treatment.    04/30/2020 -  Chemotherapy   The patient had gemcitabine (GEMZAR) 1,600 mg in sodium chloride 0.9 % 250 mL chemo infusion, 1,596 mg, Intravenous,  Once, 2 of 5 cycles Administration: 1,600 mg (04/30/2020), 1,600 mg (05/07/2020), 1,600 mg (05/28/2020)  for  chemotherapy treatment.      ALLERGIES:  is allergic to fish-derived products, sulfamethoxazole-trimethoprim, and chlorhexidine.  MEDICATIONS:  Current Facility-Administered Medications  Medication Dose Route Frequency Provider Last Rate Last Admin   0.9 %  sodium chloride infusion (Manually program via Guardrails IV Fluids)   Intravenous Once Fritzi Mandes, MD       0.9 %  sodium chloride infusion (Manually program via Guardrails IV Fluids)   Intravenous Once Nolberto Hanlon, MD       0.9 %  sodium chloride infusion  250 mL Intravenous PRN Agbata, Tochukwu, MD       albuterol (VENTOLIN HFA) 108 (90 Base) MCG/ACT inhaler 2 puff  2 puff Inhalation Q6H PRN Agbata, Tochukwu, MD       ALPRAZolam (XANAX) tablet 0.5 mg  0.5 mg Oral BID PRN Agbata, Tochukwu, MD       amLODipine (NORVASC) tablet 10 mg  10 mg Oral Daily Agbata, Tochukwu, MD   10 mg at 06/04/20 0838   atenolol (TENORMIN) tablet 50 mg  50 mg Oral Daily Agbata, Tochukwu, MD   50 mg at 06/04/20 0835   bisacodyl (DULCOLAX) suppository 10 mg  10 mg Rectal Daily PRN Agbata, Tochukwu, MD       budesonide (PULMICORT) nebulizer solution 0.25 mg  0.25 mg Nebulization BID Agbata, Tochukwu, MD   0.25 mg at 06/03/20 2124   bumetanide (BUMEX) tablet 0.5 mg  0.5 mg Oral BID Agbata, Tochukwu, MD   0.5 mg at 06/04/20 2774   calcitRIOL (ROCALTROL) capsule 0.25 mcg  0.25 mcg Oral Daily Agbata, Tochukwu, MD   0.25 mcg at 06/04/20 0835   cloNIDine (CATAPRES) tablet 0.2 mg  0.2 mg Oral BID Agbata, Tochukwu, MD   0.2 mg at 06/04/20 0835   docusate sodium (COLACE) capsule 100 mg  100 mg Oral Daily Agbata, Tochukwu, MD   100 mg at 06/04/20 0835   [START ON 06/05/2020] famotidine (PEPCID) tablet 20 mg  20 mg Oral Daily Nolberto Hanlon, MD       feeding supplement (GLUCERNA SHAKE) (GLUCERNA SHAKE) liquid 237 mL  237 mL Oral BID BM Fritzi Mandes, MD   237 mL at 06/04/20 1335   ferrous sulfate tablet 325 mg  325 mg Oral Q breakfast Agbata, Tochukwu, MD   325  mg at 06/04/20 0835   FLUoxetine (PROZAC) capsule 20 mg  20 mg Oral BID Agbata, Tochukwu, MD   20 mg at 06/04/20 0835   gabapentin (NEURONTIN) capsule 100 mg  100 mg Oral QHS Agbata, Tochukwu, MD   100 mg at 06/03/20 2148   insulin aspart (novoLOG) injection 0-15 Units  0-15 Units Subcutaneous TID WC Agbata, Tochukwu, MD   5 Units at 06/04/20 1146   ondansetron (ZOFRAN) tablet 4 mg  4 mg  Oral Q6H PRN Agbata, Tochukwu, MD       Or   ondansetron (ZOFRAN) injection 4 mg  4 mg Intravenous Q6H PRN Agbata, Tochukwu, MD   4 mg at 06/03/20 1006   oxyCODONE-acetaminophen (PERCOCET/ROXICET) 5-325 MG per tablet 1 tablet  1 tablet Oral Q4H PRN Agbata, Tochukwu, MD   1 tablet at 06/03/20 2148   senna (SENOKOT) tablet 8.6 mg  1 tablet Oral BID PRN Agbata, Tochukwu, MD       simvastatin (ZOCOR) tablet 20 mg  20 mg Oral QHS Agbata, Tochukwu, MD   20 mg at 06/03/20 2149   sodium chloride flush (NS) 0.9 % injection 3 mL  3 mL Intravenous Q12H Agbata, Tochukwu, MD   3 mL at 06/04/20 0855   sodium chloride flush (NS) 0.9 % injection 3 mL  3 mL Intravenous PRN Agbata, Tochukwu, MD       Facility-Administered Medications Ordered in Other Encounters  Medication Dose Route Frequency Provider Last Rate Last Admin   sodium chloride flush (NS) 0.9 % injection 10 mL  10 mL Intravenous PRN Gloria Sickle, MD   10 mL at 01/30/16 1054    VITAL SIGNS: BP 116/72 (BP Location: Right Arm)    Pulse 76    Temp 97.8 F (36.6 C) (Oral)    Resp 18    Ht _0  (1.575 m)    Wt 191 lb (86.6 kg)    SpO2 100%    BMI 34.93 kg/m  Filed Weights   06/02/20 1043  Weight: 191 lb (86.6 kg)    Estimated body mass index is 34.93 kg/m as calculated from the following:   Height as of this encounter: _1  (1.575 m).   Weight as of this encounter: 191 lb (86.6 kg).  LABS: CBC:    Component Value Date/Time   WBC 6.6 06/04/2020 0446   HGB 7.6 (L) 06/04/2020 0446   HGB 12.5 06/06/2014 1102   HCT 23.3 (L) 06/04/2020 0446    HCT 37.6 06/06/2014 1102   PLT 417 (H) 06/04/2020 0446   PLT 396 06/06/2014 1102   MCV 90.0 06/04/2020 0446   MCV 91 06/06/2014 1102   NEUTROABS 4.6 06/04/2020 0446   NEUTROABS 6.9 (H) 06/06/2014 1102   LYMPHSABS 0.5 (L) 06/04/2020 0446   LYMPHSABS 2.0 06/06/2014 1102   MONOABS 0.8 06/04/2020 0446   MONOABS 0.7 06/06/2014 1102   EOSABS 0.1 06/04/2020 0446   EOSABS 0.0 06/06/2014 1102   BASOSABS 0.0 06/04/2020 0446   BASOSABS 0.1 06/06/2014 1102   Comprehensive Metabolic Panel:    Component Value Date/Time   NA 137 06/03/2020 0504   NA 130 (L) 06/06/2014 1102   K 4.2 06/03/2020 0504   K 3.9 06/06/2014 1102   CL 101 06/03/2020 0504   CL 95 (L) 06/06/2014 1102   CO2 27 06/03/2020 0504   CO2 28 06/06/2014 1102   BUN 46 (H) 06/03/2020 0504   BUN 17 06/06/2014 1102   CREATININE 2.42 (H) 06/04/2020 0446   CREATININE 1.63 (H) 06/06/2014 1102   GLUCOSE 80 06/03/2020 0504   GLUCOSE 349 (H) 06/06/2014 1102   CALCIUM 8.3 (L) 06/03/2020 0504   CALCIUM 9.2 06/06/2014 1102   AST 14 (L) 05/28/2020 0829   AST 7 (L) 06/06/2014 1102   ALT 9 05/28/2020 0829   ALT 12 (L) 06/06/2014 1102   ALKPHOS 71 05/28/2020 0829   ALKPHOS 74 06/06/2014 1102   BILITOT 0.5 05/28/2020 0829   BILITOT 0.4 06/06/2014 1102  PROT 6.9 05/28/2020 0829   PROT 8.2 06/06/2014 1102   ALBUMIN 2.6 (L) 05/28/2020 0829   ALBUMIN 3.3 (L) 06/06/2014 1102    RADIOGRAPHIC STUDIES: DG Chest 2 View  Result Date: 06/02/2020 CLINICAL DATA:  Shortness of breath EXAM: CHEST - 2 VIEW COMPARISON:  March 29, 2020 FINDINGS: Stable right Port-A-Cath. No pneumothorax. Bilateral pleural effusions with underlying opacities are similar in the interval. Mild interstitial prominence. No other acute abnormalities or changes. IMPRESSION: 1. Stable bilateral pleural effusions with underlying opacities. 2. Possible mild pulmonary edema/pulmonary venous congestion. Electronically Signed   By: Dorise Bullion III M.D   On: 06/02/2020  11:29   CT Chest Wo Contrast  Result Date: 06/02/2020 CLINICAL DATA:  64 year old female with a history of shortness of breath, metastatic breast cancer EXAM: CT CHEST WITHOUT CONTRAST TECHNIQUE: Multidetector CT imaging of the chest was performed following the standard protocol without IV contrast. COMPARISON:  PET-CT 03/28/2020 FINDINGS: Cardiovascular: Heart size is unchanged. No pericardial fluid. Normal course caliber and contour of the thoracic aorta. No significant atherosclerotic changes. Right IJ port catheter. Mediastinum/Nodes: Unremarkable thyroid/thoracic inlet. Unremarkable thoracic esophagus. The subcarinal node node measures smaller than the comparison PET CT, previously approximately 10 mm, now 7 mm. Otherwise, no enlarged mediastinal lymph nodes. Lungs/Pleura: Bilateral pleural effusions, predominantly in the dependent aspects of the lungs. There is rim of hyperdensity adjacent to the low-density fluid at the costophrenic sulci and extending to the medial, posterior, and lateral pleural/fluid interface of the bilateral lungs. The fluid extends along the lateral lung to the anterior lung on the left, with small volume of partially trapped fluid. There is hyperdense tissue thickening along the pleural mediastinal interface of the upper mediastinum, more pronounced on the left. The appearance of the nodular thickening is relatively similar to the PET CT dated 03/28/2020. Overall, the volume of fluid appears to have slightly progressed. Nodular lung changes at the periphery of the left upper lobe on image 45 of series 3, left lower lobe on image 86 of series 2, and at the lower right lung, image 75 of series 2 and image 92 of series 2. No evidence of interlobular septal thickening. There is no confluent airspace disease. No endotracheal or endobronchial debris. Upper Abdomen: No acute finding of the upper abdomen. Musculoskeletal: Redemonstration of spiculated soft tissue nodule of the left  breast, compatible with the given history of breast cancer. The more inferior aspect of the mass measures slightly larger in AP dimension on the axial CT, previously 27 mm, now 30 mm. Although sclerotic foci within the skeleton are relatively unchanged, there are new/enlarging lytic lesions of the thoracic spine including: T5 on image 48 of series 2, with new lytic lesion adjacent to the sclerotic focus T6 at the anterior vertebral body, enlarging from the prior T7 at the right vertebral body, mixed lytic and sclerotic, enlarging from the prior T10 at the anterior vertebral body, enlarging T11, posterior vertebral body, enlarging IMPRESSION: Bilateral malignant pleural effusions, which are only slightly larger than the comparison PET CT of 03/28/2020, with associated evidence of visceral/parietal pleural metastases. Redemonstration skeletal metastases, with new/enlarging lytic lesions of the thoracic spine compatible with progression. The left breast mass appears to be enlarging compared to the prior PET. Electronically Signed   By: Corrie Mckusick D.O.   On: 06/02/2020 15:35   DG Chest Port 1 View  Result Date: 06/04/2020 CLINICAL DATA:  Status post thoracentesis from the left. EXAM: PORTABLE CHEST 1 VIEW COMPARISON:  Chest radiograph  06/02/2020. FINDINGS: Right anterior chest wall Port-A-Cath is present with tip projecting over the superior vena cava. Monitoring leads overlie the patient. Stable enlarged cardiac and mediastinal contours. Persistent moderate bilateral pleural effusions and underlying consolidation. No definite pneumothorax. Thoracic spine degenerative changes. IMPRESSION: Persistent moderate bilateral pleural effusions and underlying consolidation. Electronically Signed   By: Lovey Newcomer M.D.   On: 06/04/2020 10:23   DG Foot Complete Left  Result Date: 06/02/2020 CLINICAL DATA:  Osteomyelitis of the left foot. EXAM: LEFT FOOT - COMPLETE 3+ VIEW COMPARISON:  April 03, 2020 FINDINGS: There is  no evidence of fracture or dislocation. There is no evidence of cortical destructive lesions. Stable postsurgical changes from transmetatarsal amputation of the fifth ray. Soft tissue swelling of the midfoot. IMPRESSION: 1. No radiographic evidence of osteomyelitis. 2. Soft tissue swelling of the midfoot. 3. Stable postsurgical changes from transmetatarsal amputation of the fifth ray. Electronically Signed   By: Fidela Salisbury M.D.   On: 06/02/2020 16:51   US THORACENTESIS ASP PLEURAL SPACE W/IMG GUIDE  Result Date: 06/04/2020 INDICATION: Patient with metastatic breast cancer, now with bilateral pleural effusions. Request made for diagnostic and therapeutic thoracentesis EXAM: ULTRASOUND GUIDED DIAGNOSTIC AND THERAPEUTIC LEFT THORACENTESIS MEDICATIONS: 10 mL 1% lidocaine COMPLICATIONS: None immediate. PROCEDURE: An ultrasound guided thoracentesis was thoroughly discussed with the patient and questions answered. The benefits, risks, alternatives and complications were also discussed. The patient understands and wishes to proceed with the procedure. Written consent was obtained. Ultrasound was performed to localize and mark an adequate pocket of fluid in the left chest. The area was then prepped and draped in the normal sterile fashion. 1% Lidocaine was used for local anesthesia. Under ultrasound guidance a 6 Fr Safe-T-Centesis catheter was introduced. Thoracentesis was performed. The catheter was removed and a dressing applied. FINDINGS: A total of approximately 500 mL of blood-tinged fluid was removed. Samples were sent to the laboratory as requested by the clinical team. Procedure stopped prior to removal of all fluid due to patient's complaint of discomfort. IMPRESSION: Successful ultrasound guided left thoracentesis yielding 500 mL of pleural fluid. Read by: Brynda Greathouse PA-C Electronically Signed   By: Markus Daft M.D.   On: 06/04/2020 11:11    PERFORMANCE STATUS (ECOG) : 3 - Symptomatic, >50%  confined to bed  Review of Systems Unless otherwise noted, a complete review of systems is negative.  Physical Exam General: NAD Pulmonary: Unlabored, on O2 Extremities: no edema, no joint deformities Skin: Left foot wound noted but not visualized.  Foot was bandaged. Neurological: Weakness but otherwise nonfocal  IMPRESSION: I met with patient to discuss goals.  I introduced palliative care services and attempted to establish therapeutic rapport.  Symptomatically, patient reports feeling much improved following her thoracentesis this morning.  She currently denies shortness of breath or other distressing symptoms.  Patient is aware that imaging has shown disease progression.  Given her poor performance status and multiple comorbidities, patient is felt likely to be a poor treatment candidate.  Patient states that she feels like she has a lot of life left to live and does not want to "give up."  We discussed arranging a family meeting and patient was in agreement.  I have tried to call both of patient's sons without success.  We'll continue to try to reach family.  At baseline, patient reports that she lives at home with her fianc.  Her fianc is her primary caregiver as patient is mostly in the wheelchair or bed given her recent amputation.  Patient denies having ACP documents.  She is currently a full code.  PLAN: -Continue current scope of treatment -Will try to reach family to arrange meeting  Case and plan discussed with Dr. Rogue Bussing   Time Total: 60 minutes  Visit consisted of counseling and education dealing with the complex and emotionally intense issues of symptom management and palliative care in the setting of serious and potentially life-threatening illness.Greater than 50%  of this time was spent counseling and coordinating care related to the above assessment and plan.  Signed by: Altha Harm, PhD, NP-C

## 2020-06-04 NOTE — TOC Initial Note (Signed)
Transition of Care [facility]) - Initial/Assessment Note    Patient Details  Name: [name] MRN: [id number] Date of Birth: 1956-02-20  Transition of [facility]) CM/SW Contact:    Candie Chroman, LCSW Phone Number: 06/04/2020, 4:05 PM  Clinical Narrative: Readmission prevention screen complete. CSW met with patient. No supports at bedside. CSW introduced role and explained that discharge planning would be discussed. PCP is Dr. Ginette Pitman. No transportation issues. Pharmacy is [facility] in [place]. No issues obtaining medications. Patient was discharge from [facility] on 10/27 (PT and OT). They would be willing to accept her back if needed. Patient has a wheelchair, walker, and bedside commode at home. She is not on oxygen at home. Will follow for this need. Patient was discharged to [facility] on 9/6. No further concerns. CSW encouraged patient to contact CSW as needed. CSW will continue to follow patient for support and facilitate return home when stable.                 Expected Discharge Plan: [place] Barriers to Discharge: Continued Medical Work up   Patient Goals and CMS Choice        Expected Discharge Plan and Services Expected Discharge Plan: [place]       Living arrangements for the past 2 months: Single Family Home                                      Prior Living Arrangements/Services Living arrangements for the past 2 months: Single Family Home Lives with:: Self Patient language and need for interpreter reviewed:: Yes Do you feel safe going back to the place where you live?: Yes      Need for Family Participation in Patient Care: Yes (Comment) Care giver support system in place?: No (comment)   Criminal Activity/Legal Involvement Pertinent to Current Situation/Hospitalization: No - Comment as needed  Activities of Daily Living Home Assistive Devices/Equipment: Walker (specify type) ADL Screening  (condition at time of admission) Patient's cognitive ability adequate to safely complete daily activities?: Yes Is the patient deaf or have difficulty hearing?: No Does the patient have difficulty seeing, even when wearing glasses/contacts?: Yes Does the patient have difficulty concentrating, remembering, or making decisions?: No Patient able to express need for assistance with ADLs?: Yes Does the patient have difficulty dressing or bathing?: Yes Independently performs ADLs?: No Communication: Independent Dressing (OT): Needs assistance Grooming: Independent Feeding: Independent Bathing: Independent Toileting: Needs assistance In/Out Bed: Needs assistance Walks in Home: Needs assistance Does the patient have difficulty walking or climbing stairs?: Yes Weakness of Legs: Both Weakness of Arms/Hands: None  Permission Sought/Granted                  Emotional Assessment Appearance:: Appears stated age Attitude/Demeanor/Rapport: Engaged, Gracious Affect (typically observed): Accepting, Appropriate, Calm, Pleasant Orientation: : Oriented to Self, Oriented to Place, Oriented to  Time, Oriented to Situation Alcohol / Substance Use: Not Applicable Psych Involvement: No (comment)  Admission diagnosis:  Shortness of breath [R06.02] SOB (shortness of breath) [R06.02] Malignant pleural effusion [J91.0] Acute on chronic diastolic CHF (congestive heart failure) (HCC) [I50.33] Osteomyelitis of ankle or foot, acute, left (HCC) [M86.172] Leukocytosis, unspecified type [D72.829] Patient Active Problem List   Diagnosis Date Noted  . Palliative care encounter   . Acute on chronic diastolic CHF (congestive heart failure) ([place]) 06/02/2020  .  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 Leukocytosis 06/02/2020  . Osteomyelitis ([place]) 04/05/2020  . Diabetic foot ulcer ([place]) 04/05/2020  . CKD (chronic kidney disease), stage III ([place]) 04/05/2020  . Sepsis ([place]) 03/29/2020  . Antineoplastic  chemotherapy induced anemia 03/02/2020  . Weakness generalized 03/02/2020  . Fall at home 03/02/2020  . Tear of meniscus of knee 11/17/2019  . Lactic acidosis   . AKI (acute kidney injury) ([place]) 09/27/2019  . Metastatic breast cancer ([place]) 09/27/2019  . Hypertension complicating diabetes ([place]) 09/27/2019  . Hyperlipidemia associated with type 2 diabetes mellitus ([place]) 09/27/2019  . Hyperkalemia 09/27/2019  . Taking a statin medication 05/18/2019  . Anemia of chronic disease 04/27/2019  . Benign hypertensive kidney disease with chronic kidney disease 04/27/2019  . Hyposmolality and/or hyponatremia 04/27/2019  . Proteinuria 04/27/2019  . Secondary hyperparathyroidism of renal origin ([place]) 04/27/2019  . Goals of care, counseling/discussion 10/01/2018  . Major depressive disorder, recurrent, in remission ([place]) 09/13/2018  . Metastasis from malignant tumor of breast ([place]) 09/13/2018  . Genetic testing 07/02/2018  . Family history of breast cancer   . Family history of ovarian cancer   . Family history of pancreatic cancer   . Family history of colon cancer   . Family history of stomach cancer   . Family history of prostate cancer   . Carcinoma of upper-inner quadrant of left breast in female, estrogen receptor positive ([place]) 05/23/2016  . Type 2 diabetes mellitus with diabetic chronic kidney disease ([place]) 08/27/2015  . Cerebrovascular accident (CVA) ([place]) 08/27/2015  . Benign essential HTN 12/15/2014  . Chronic systolic CHF (congestive heart failure) ([place]) 06/21/2014  . Combined fat and carbohydrate induced hyperlipemia 06/21/2014  . MI (mitral incompetence) 06/21/2014  . Asthma 06/09/2014  . Chronic kidney disease (CKD), stage V ([place]) 06/09/2014  . Anxiety and depression 06/09/2014   PCP:  [name], MD Pharmacy:   [facility] [phone] - [address], [place] AT [facility] OF [address] & [address] [place] [address] Phone: [phone] Fax:  [phone]  [facility] [place], [place] - [address] [place] [address] Phone: [phone] Fax: [phone]  [place], [place] [address] [place] [address] Phone: [phone] Fax: [phone]     Social Determinants of Health ([place]) Interventions    Readmission Risk Interventions Readmission Risk Prevention Plan 06/04/2020 04/01/2020  Transportation Screening Complete Complete  Medication Review ([place]) Complete Complete  PCP or Specialist appointment within 3-5 days of discharge Complete Complete  HRI or [place] Complete Complete  SW Recovery Care/Counseling Consult Complete Complete  Palliative Care Screening Not Applicable Not [place] Not Applicable Not Applicable  Some recent data might be hidden

## 2020-06-04 NOTE — Procedures (Addendum)
PROCEDURE SUMMARY:  Limited US Chest shows fluid bilaterally, some loculations present on the right.  Patient reports greater symptoms/discomfort on the left. Successful US guided left diagnostic and therapeutic thoracentesis. Yielded 500 mL of blood-tinged fluid. Pt tolerated procedure well.  Procedure was stopped prior to removal of all fluid once patient complained of discomfort.  No immediate complications.  Specimen was sent for labs. CXR ordered.  EBL < 5 mL  Docia Barrier PA-C 06/04/2020 10:00 AM

## 2020-06-04 NOTE — Progress Notes (Signed)
PROGRESS NOTE    [name]  NKN:[id number] DOB: 01-06-1956 DOA: 06/02/2020 PCP: [name], MD    Brief Narrative:  [name] is a 64 y.o. female with medical history significant for stage IV breast cancer on chemotherapy, stage IV chronic kidney disease, depression, hypertension, GERD who presents to the emergency room for evaluation of shortness of breath mostly with exertion which she has had for about a week.  CT scan of the chest without contrast shows bilateral malignant pleural effusions which are only slightly larger in comparison PET/CT with associated evidence of visceral, parietal pleural metastasis.  Redemonstration of skeletal mets with new/enlarging lytic lesions of the thoracic spine compatible with progression.  The left breast mass appears to be enlarged compared to the prior PET.   11/1-s/p left diagnostic and therapeutic thoracentesis, yielded 500 mL of blood-tinged fluid  Consultants:   Hematology, podiatry  Procedures: Left thoracentesis, CT  Antimicrobials:       Subjective: States her shortness of breath is better post thoracentesis.  Not very hungry.  No other complaints   Objective: Vitals:   06/03/20 1956 06/03/20 2124 06/03/20 2233 06/04/20 0624  BP: 127/82  117/68 124/76  Pulse: 82  85 81  Resp: 20  20 20   Temp: 98.2 F (36.8 C)  98.7 F (37.1 C) 97.7 F (36.5 C)  TempSrc: Oral  Oral Oral  SpO2: 100% 100% 99% 100%  Weight:      Height:        Intake/Output Summary (Last 24 hours) at 06/04/2020 0750 Last data filed at 06/04/2020 0700 Gross per 24 hour  Intake 291 ml  Output 300 ml  Net -9 ml   Filed Weights   06/02/20 1043  Weight: 86.6 kg    Examination:  General exam: Appears calm and comfortable  Respiratory system: Decreased breath sounds bilaterally at bases, no wheeze rales or rhonchi Cardiovascular system: S1 & S2 heard, RRR. No JVD, murmurs, rubs, gallops or clicks.  Gastrointestinal system: Abdomen is  nondistended, soft and nontender. Normal bowel sounds heard. Central nervous system: Alert and oriented.  Grossly intact Extremities: Warm dry Psychiatry: Judgement and insight appear normal. Mood & affect appropriate in current setting  .     Data Reviewed: I have personally reviewed following labs and imaging studies  CBC: Recent Labs  Lab 05/28/20 0829 06/02/20 1048 06/03/20 0504 06/04/20 0446  WBC 16.7* 20.5* 15.1* 6.6  NEUTROABS 9.5*  --   --  4.6  HGB 8.8* 7.9* 6.4* 7.6*  HCT 26.9* 24.4* 19.5* 23.3*  MCV 88.8 90.0 89.9 90.0  PLT 883* 596* 445* 379*   Basic Metabolic Panel: Recent Labs  Lab 05/28/20 0829 06/02/20 1048 06/03/20 0504 06/04/20 0446  NA 137 136 137  --   K 4.6 4.5 4.2  --   CL 102 98 101  --   CO2 28 25 27   --   GLUCOSE 67* 267* 80  --   BUN 37* 50* 46*  --   CREATININE 2.84* 3.17* 2.73* 2.42*  CALCIUM 8.5* 8.5* 8.3*  --    GFR: Estimated Creatinine Clearance: 24.3 mL/min (A) (by C-G formula based on SCr of 2.42 mg/dL (H)). Liver Function Tests: Recent Labs  Lab 05/28/20 0829  AST 14*  ALT 9  ALKPHOS 71  BILITOT 0.5  PROT 6.9  ALBUMIN 2.6*   No results for input(s): LIPASE, AMYLASE in the last 168 hours. No results for input(s): AMMONIA in the last 168 hours. Coagulation Profile: No  results for input(s): INR, PROTIME in the last 168 hours. Cardiac Enzymes: No results for input(s): CKTOTAL, CKMB, CKMBINDEX, TROPONINI in the last 168 hours. BNP (last 3 results) No results for input(s): PROBNP in the last 8760 hours. HbA1C: No results for input(s): HGBA1C in the last 72 hours. CBG: Recent Labs  Lab 06/02/20 2259 06/03/20 0919 06/03/20 1304 06/03/20 1636 06/03/20 2104  GLUCAP 101* 94 157* 142* 147*   Lipid Profile: No results for input(s): CHOL, HDL, LDLCALC, TRIG, CHOLHDL, LDLDIRECT in the last 72 hours. Thyroid Function Tests: No results for input(s): TSH, T4TOTAL, FREET4, T3FREE, THYROIDAB in the last 72 hours. Anemia  Panel: No results for input(s): VITAMINB12, FOLATE, FERRITIN, TIBC, IRON, RETICCTPCT in the last 72 hours. Sepsis Labs: Recent Labs  Lab 06/02/20 1048  PROCALCITON 0.47    Recent Results (from the past 240 hour(s))  Respiratory Panel by RT PCR (Flu A&B, Covid) - Nasopharyngeal Swab     Status: None   Collection Time: 06/02/20  2:08 PM   Specimen: Nasopharyngeal Swab  Result Value Ref Range Status   SARS Coronavirus 2 by RT PCR NEGATIVE NEGATIVE Final    Comment: (NOTE) SARS-CoV-2 target nucleic acids are NOT DETECTED.  The SARS-CoV-2 RNA is generally detectable in upper respiratoy specimens during the acute phase of infection. The lowest concentration of SARS-CoV-2 viral copies this assay can detect is 131 copies/mL. A negative result does not preclude SARS-Cov-2 infection and should not be used as the sole basis for treatment or other patient management decisions. A negative result may occur with  improper specimen collection/handling, submission of specimen other than nasopharyngeal swab, presence of viral mutation(s) within the areas targeted by this assay, and inadequate number of viral copies (<131 copies/mL). A negative result must be combined with clinical observations, patient history, and epidemiological information. The expected result is Negative.  Fact Sheet for Patients:  PinkCheek.be  Fact Sheet for Healthcare Providers:  GravelBags.it  This test is no t yet approved or cleared by the [place] FDA and  has been authorized for detection and/or diagnosis of SARS-CoV-2 by FDA under an Emergency Use Authorization (EUA). This EUA will remain  in effect (meaning this test can be used) for the duration of the COVID-19 declaration under Section 564(b)(1) of the Act, 21 U.S.C. section 360bbb-3(b)(1), unless the authorization is terminated or revoked sooner.     Influenza A by PCR NEGATIVE NEGATIVE Final    Influenza B by PCR NEGATIVE NEGATIVE Final    Comment: (NOTE) The Xpert Xpress SARS-CoV-2/FLU/RSV assay is intended as an aid in  the diagnosis of influenza from Nasopharyngeal swab specimens and  should not be used as a sole basis for treatment. Nasal washings and  aspirates are unacceptable for Xpert Xpress SARS-CoV-2/FLU/RSV  testing.  Fact Sheet for Patients: PinkCheek.be  Fact Sheet for Healthcare Providers: GravelBags.it  This test is not yet approved or cleared by the [place] FDA and  has been authorized for detection and/or diagnosis of SARS-CoV-2 by  FDA under an Emergency Use Authorization (EUA). This EUA will remain  in effect (meaning this test can be used) for the duration of the  Covid-19 declaration under Section 564(b)(1) of the Act, 21  U.S.C. section 360bbb-3(b)(1), unless the authorization is  terminated or revoked. Performed at [facility], [address] [place], [place] [address]   Culture, blood (Routine X 2) w Reflex to ID Panel     Status: None (Preliminary result)   Collection Time: 06/02/20  4:32 PM   Specimen: BLOOD  Result Value Ref Range Status   Specimen Description BLOOD RIGHT PORT  Final   Special Requests   Final    BOTTLES DRAWN AEROBIC AND ANAEROBIC Blood Culture adequate volume   Culture   Final    NO GROWTH 2 DAYS Performed at [facility], [address], [place], [place] [address]    Report Status PENDING  Incomplete  Culture, blood (Routine X 2) w Reflex to ID Panel     Status: None (Preliminary result)   Collection Time: 06/02/20  6:00 PM   Specimen: BLOOD  Result Value Ref Range Status   Specimen Description BLOOD RIGHT PORT  Final   Special Requests   Final    BOTTLES DRAWN AEROBIC AND ANAEROBIC Blood Culture adequate volume   Culture   Final    NO GROWTH 2 DAYS Performed at [facility], [address], [place], [place] [address]     Report Status PENDING  Incomplete  Aerobic/Anaerobic Culture (surgical/deep wound)     Status: None (Preliminary result)   Collection Time: 06/03/20 11:14 AM   Specimen: Wound  Result Value Ref Range Status   Specimen Description   Final    WOUND Performed at [facility], [address] [place], [place] [address]    Special Requests   Final    NONE Performed at [facility], [address] [place], [place] [address]    Gram Stain   Final    RARE WBC PRESENT, PREDOMINANTLY PMN FEW GRAM POSITIVE COCCI IN PAIRS IN CLUSTERS Performed at [facility], [place] [address] [place], [place] [address]    Culture PENDING  Incomplete   Report Status PENDING  Incomplete         Radiology Studies: DG Chest 2 View  Result Date: 06/02/2020 CLINICAL DATA:  Shortness of breath EXAM: CHEST - 2 VIEW COMPARISON:  March 29, 2020 FINDINGS: Stable right Port-A-Cath. No pneumothorax. Bilateral pleural effusions with underlying opacities are similar in the interval. Mild interstitial prominence. No other acute abnormalities or changes. IMPRESSION: 1. Stable bilateral pleural effusions with underlying opacities. 2. Possible mild pulmonary edema/pulmonary venous congestion. Electronically Signed   By: Dorise Bullion III M.D   On: 06/02/2020 11:29   CT Chest Wo Contrast  Result Date: 06/02/2020 CLINICAL DATA:  63 year old female with a history of shortness of breath, metastatic breast cancer EXAM: CT CHEST WITHOUT CONTRAST TECHNIQUE: Multidetector CT imaging of the chest was performed following the standard protocol without IV contrast. COMPARISON:  PET-CT 03/28/2020 FINDINGS: Cardiovascular: Heart size is unchanged. No pericardial fluid. Normal course caliber and contour of the thoracic aorta. No significant atherosclerotic changes. Right IJ port catheter. Mediastinum/Nodes: Unremarkable thyroid/thoracic inlet. Unremarkable thoracic esophagus. The subcarinal node node measures  smaller than the comparison PET CT, previously approximately 10 mm, now 7 mm. Otherwise, no enlarged mediastinal lymph nodes. Lungs/Pleura: Bilateral pleural effusions, predominantly in the dependent aspects of the lungs. There is rim of hyperdensity adjacent to the low-density fluid at the costophrenic sulci and extending to the medial, posterior, and lateral pleural/fluid interface of the bilateral lungs. The fluid extends along the lateral lung to the anterior lung on the left, with small volume of partially trapped fluid. There is hyperdense tissue thickening along the pleural mediastinal interface of the upper mediastinum, more pronounced on the left. The appearance of the nodular thickening is relatively similar to the PET CT dated 03/28/2020. Overall, the volume of fluid appears  to have slightly progressed. Nodular lung changes at the periphery of the left upper lobe on image 45 of series 3, left lower lobe on image 86 of series 2, and at the lower right lung, image 75 of series 2 and image 92 of series 2. No evidence of interlobular septal thickening. There is no confluent airspace disease. No endotracheal or endobronchial debris. Upper Abdomen: No acute finding of the upper abdomen. Musculoskeletal: Redemonstration of spiculated soft tissue nodule of the left breast, compatible with the given history of breast cancer. The more inferior aspect of the mass measures slightly larger in AP dimension on the axial CT, previously 27 mm, now 30 mm. Although sclerotic foci within the skeleton are relatively unchanged, there are new/enlarging lytic lesions of the thoracic spine including: T5 on image 48 of series 2, with new lytic lesion adjacent to the sclerotic focus T6 at the anterior vertebral body, enlarging from the prior T7 at the right vertebral body, mixed lytic and sclerotic, enlarging from the prior T10 at the anterior vertebral body, enlarging T11, posterior vertebral body, enlarging IMPRESSION: Bilateral  malignant pleural effusions, which are only slightly larger than the comparison PET CT of 03/28/2020, with associated evidence of visceral/parietal pleural metastases. Redemonstration skeletal metastases, with new/enlarging lytic lesions of the thoracic spine compatible with progression. The left breast mass appears to be enlarging compared to the prior PET. Electronically Signed   By: Corrie Mckusick D.O.   On: 06/02/2020 15:35   DG Foot Complete Left  Result Date: 06/02/2020 CLINICAL DATA:  Osteomyelitis of the left foot. EXAM: LEFT FOOT - COMPLETE 3+ VIEW COMPARISON:  April 03, 2020 FINDINGS: There is no evidence of fracture or dislocation. There is no evidence of cortical destructive lesions. Stable postsurgical changes from transmetatarsal amputation of the fifth ray. Soft tissue swelling of the midfoot. IMPRESSION: 1. No radiographic evidence of osteomyelitis. 2. Soft tissue swelling of the midfoot. 3. Stable postsurgical changes from transmetatarsal amputation of the fifth ray. Electronically Signed   By: Fidela Salisbury M.D.   On: 06/02/2020 16:51        Scheduled Meds: . sodium chloride   Intravenous Once  . amLODipine  10 mg Oral Daily  . atenolol  50 mg Oral Daily  . budesonide (PULMICORT) nebulizer solution  0.25 mg Nebulization BID  . bumetanide  0.5 mg Oral BID  . calcitRIOL  0.25 mcg Oral Daily  . cloNIDine  0.2 mg Oral BID  . docusate sodium  100 mg Oral Daily  . famotidine  20 mg Oral BID  . feeding supplement (GLUCERNA SHAKE)  237 mL Oral BID BM  . ferrous sulfate  325 mg Oral Q breakfast  . FLUoxetine  20 mg Oral BID  . gabapentin  100 mg Oral QHS  . insulin aspart  0-15 Units Subcutaneous TID WC  . simvastatin  20 mg Oral QHS  . sodium chloride flush  3 mL Intravenous Q12H   Continuous Infusions: . sodium chloride      Assessment & Plan:   Principal Problem:   Malignant pleural effusion Active Problems:   Benign essential HTN   Anxiety and depression    Type 2 diabetes mellitus with diabetic chronic kidney disease (HCC)   Metastasis from malignant tumor of breast (HCC)   Antineoplastic chemotherapy induced anemia   Diabetic foot ulcer ([place])   Leukocytosis   [name] a 64 y.o.femalewith medical history significant forstage IV breast cancer on chemotherapy, stage IV chronic kidney disease, depression,  hypertension, GERD who presents to the emergency room for evaluation of shortness of breath mostly with exertion which she has had for about a week.   Malignant pleural effusion, Bilateral Secondary to known metastatic breast cancer -Patient presents for evaluation of exertional shortness of breath and at rest has room air pulse oximetry of 92% which dropped to 89% with conversation -11/1-currently on 3L . status post thoracentesis of the left .we will likely need thoracentesis of the right  Keep O2 above 92%  Given the nature of pleural effusion due to malignancy may need Pleurx catheter or pleurodesis to prevent reaccumulation will discuss with hematology    Metastatic breast cancer Patient with known stage IV breast cancer -CT scan of the chest without contrast showsbilateral malignant pleural effusions, which are only slightly larger than the comparison PET CT of 03/28/2020, with associated evidence of visceral/parietal pleural metastases. -Re demonstrationofskeletal metastases, with new/enlarging lytic lesions of the thoracic spine compatible with progression.The left breast mass appears to be enlarging compared to the prior PET. 11/1-oncology following With patient has poor tolerance to therapy-severe anemia/risk of infection/CKD stage IV etc. per oncologist difficult situation where the benefits of chemotherapy may outweighed by the risk of chemotherapy -oncology recommends palliative care   Diabetes mellitus with complications of stage IV chronic kidney disease Blood glucose levels are stable  Continue R-ISS     anemia of chronic disease multifactorial:antineoplastic/poor PO intake/cancer  -Hemoglobin  on admission is 7.9g/dl-- 6.4--1 unit BT on 10/31 11/1-oncology via chat recommended 1 unit PRBC today   Hypertension -Continue Bumex, clonidine, atenolol     Depression and anxiety -Continue alprazolam and fluoxetine  History of diastolic dysfunction CHF Continue atenolol,  Bumex  Leukocytosis -Unclear etiology improved 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 Wound cx sent off. Redressed wound.  Continue empiric abx, per podiatry at this time does not see any obvious signs of infection to the left foot based on clinical and radiographic examination .  If other potential concerns eyes and no other sources of infection are noted then could potentially obtain an MRI of the left foot but do not believe it warrants it at this time based on the clinical and radiographic picture so far. Pt should be using Prevalon boots.  When in bed to avoid pressure type ulcerations.  Orders were placed by podiatry. Culture with multiple organisms Afebrile, at this time antibiotics have been held   CKD stage IV- creatinine 2.42 Nephrology following   DVT prophylaxis: SCD Code Status: Full Family Communication: None at bedside  Status is: Inpatient  Remains inpatient appropriate because:Inpatient level of care appropriate due to severity of illness   Dispo: The patient is from: Home              Anticipated d/c is to: Home              Anticipated d/c date is: 2 days              Patient currently is not medically stable to d/c.needing thoracentesis in am . Palliative care consulted. Getting PRBC transfusion            LOS: 2 days   Time spent: 35 minutes with more than 50% on [place], MD [facility] Pager [phone]  If 7PM-7AM, please contact night-coverage [email or web address] Password [email or web address]  06/04/2020, 7:50 AM

## 2020-06-04 NOTE — Assessment & Plan Note (Addendum)
#  64 year old female patient with multiple medical problems history of metastatic stage IV breast cancer ER/PR positive HER-2 negative status post multiple lines of therapy-most recent gemcitabine currently admitted hospital for worsening shortness of breath  #Worsening shortness of breath-multifactorial severe anemia hemoglobin 6.5; bilateral worsening pleural effusion [see below]; s/p paracentesis x2 bilateral- IMPROVED.  Await fluid cytology.  #Metastatic left breast cancer ER/PR +, HER-2/neu-currently on gemcitabine chemotherapy s/p cycle number 2-day 1.  October 30 CT scan unfortunately-shows progression of disease-pleural metastases/bilateral effusion; worsening lytic lesions in the bones.  Patient is interested in continuing palliative chemotherapy.  Will reassess next week; and if patient clinically stable/improved-we will consider Doxil chemotherapy.  # Anemia-hemoglobin-nadir 6.7 s/p PRBC transfusion hemoglobin-9.7- STABLE.   # Chronic kidney disease - stage IV [GFR-24]- STABLE.   # Bone mets-sclerotic; Right acetabular uptake-s/p radiation; CT scan shows worsening metastases in the vertebrae; currently asymptomatic stable.  See below  #Leukocytosis-up to 20,000 on admission-question infection on broad-spectrum antibiotics.  Appreciate evaluation by podiatry-no obvious evidence of worsening foot infection or osteomyelitis.  Improving-recommend oral antibiotics at discharge.

## 2020-06-04 NOTE — Progress Notes (Signed)
[facility]  ROUNDING NOTE   Subjective:   Patient resting in bed, reports SOB better after left sided thoracentesis, which yielded 500 ml of fluid removal. Patient is on 3L supplemental O2 via [place].    Objective:  Vital signs in last 24 hours:  Temp:  [97.7 F (36.5 C)-98.7 F (37.1 C)] 97.8 F (36.6 C) (11/01 1100) Pulse Rate:  [76-89] 76 (11/01 1100) Resp:  [18-20] 18 (11/01 1100) BP: (114-132)/(65-87) 116/72 (11/01 1100) SpO2:  [95 %-100 %] 100 % (11/01 1100)  Weight change:  Filed Weights   06/02/20 1043  Weight: 86.6 kg    Intake/Output: I/O last 3 completed shifts: In: 291 [Blood:291] Out: 300 [Urine:300]   Intake/Output this shift:  No intake/output data recorded.  Physical Exam: General: Resting in bed, in no acute distress  Head:  Moist oral mucosal membranes  Eyes: Sclerae and conjunctivae clear  Neck: trachea at midline  Lungs:  Respirations even,unlabored, Lungs diminished at the bases   Heart: S1S2, no rubs or gallops  Abdomen:  Soft, nontender, non distended  Extremities:  + peripheral edema.  Neurologic: Oriented x 3  Skin: No acute lesions or rashes       Basic Metabolic Panel: Recent Labs  Lab 06/02/20 1048 06/03/20 0504 06/04/20 0446  NA 136 137  --   K 4.5 4.2  --   CL 98 101  --   CO2 25 27  --   GLUCOSE 267* 80  --   BUN 50* 46*  --   CREATININE 3.17* 2.73* 2.42*  CALCIUM 8.5* 8.3*  --     Liver Function Tests: No results for input(s): AST, ALT, ALKPHOS, BILITOT, PROT, ALBUMIN in the last 168 hours. No results for input(s): LIPASE, AMYLASE in the last 168 hours. No results for input(s): AMMONIA in the last 168 hours.  CBC: Recent Labs  Lab 06/02/20 1048 06/03/20 0504 06/04/20 0446  WBC 20.5* 15.1* 6.6  NEUTROABS  --   --  4.6  HGB 7.9* 6.4* 7.6*  HCT 24.4* 19.5* 23.3*  MCV 90.0 89.9 90.0  PLT 596* 445* 417*    Cardiac Enzymes: No results for input(s): CKTOTAL, CKMB, CKMBINDEX, TROPONINI in the last 168  hours.  BNP: Invalid input(s): POCBNP  CBG: Recent Labs  Lab 06/03/20 0919 06/03/20 1304 06/03/20 1636 06/03/20 2104 06/04/20 0813  GLUCAP 94 157* 142* 147* 170*    Microbiology: Results for orders placed or performed during the hospital encounter of 06/02/20  Respiratory Panel by RT PCR (Flu A&B, Covid) - Nasopharyngeal Swab     Status: None   Collection Time: 06/02/20  2:08 PM   Specimen: Nasopharyngeal Swab  Result Value Ref Range Status   SARS Coronavirus 2 by RT PCR NEGATIVE NEGATIVE Final    Comment: (NOTE) SARS-CoV-2 target nucleic acids are NOT DETECTED.  The SARS-CoV-2 RNA is generally detectable in upper respiratoy specimens during the acute phase of infection. The lowest concentration of SARS-CoV-2 viral copies this assay can detect is 131 copies/mL. A negative result does not preclude SARS-Cov-2 infection and should not be used as the sole basis for treatment or other patient management decisions. A negative result may occur with  improper specimen collection/handling, submission of specimen other than nasopharyngeal swab, presence of viral mutation(s) within the areas targeted by this assay, and inadequate number of viral copies (<131 copies/mL). A negative result must be combined with clinical observations, patient history, and epidemiological information. The expected result is Negative.  Fact Sheet for  Patients:  PinkCheek.be  Fact Sheet for Healthcare Providers:  GravelBags.it  This test is no t yet approved or cleared by the [place] FDA and  has been authorized for detection and/or diagnosis of SARS-CoV-2 by FDA under an Emergency Use Authorization (EUA). This EUA will remain  in effect (meaning this test can be used) for the duration of the COVID-19 declaration under Section 564(b)(1) of the Act, 21 U.S.C. section 360bbb-3(b)(1), unless the authorization is terminated or revoked  sooner.     Influenza A by PCR NEGATIVE NEGATIVE Final   Influenza B by PCR NEGATIVE NEGATIVE Final    Comment: (NOTE) The Xpert Xpress SARS-CoV-2/FLU/RSV assay is intended as an aid in  the diagnosis of influenza from Nasopharyngeal swab specimens and  should not be used as a sole basis for treatment. Nasal washings and  aspirates are unacceptable for Xpert Xpress SARS-CoV-2/FLU/RSV  testing.  Fact Sheet for Patients: PinkCheek.be  Fact Sheet for Healthcare Providers: GravelBags.it  This test is not yet approved or cleared by the [place] FDA and  has been authorized for detection and/or diagnosis of SARS-CoV-2 by  FDA under an Emergency Use Authorization (EUA). This EUA will remain  in effect (meaning this test can be used) for the duration of the  Covid-19 declaration under Section 564(b)(1) of the Act, 21  U.S.C. section 360bbb-3(b)(1), unless the authorization is  terminated or revoked. Performed at [facility], [address] [place], [place] [address]   Culture, blood (Routine X 2) w Reflex to ID Panel     Status: None (Preliminary result)   Collection Time: 06/02/20  4:32 PM   Specimen: BLOOD  Result Value Ref Range Status   Specimen Description BLOOD RIGHT PORT  Final   Special Requests   Final    BOTTLES DRAWN AEROBIC AND ANAEROBIC Blood Culture adequate volume   Culture   Final    NO GROWTH 2 DAYS Performed at [facility], [address] [place], [place] [address]    Report Status PENDING  Incomplete  Culture, blood (Routine X 2) w Reflex to ID Panel     Status: None (Preliminary result)   Collection Time: 06/02/20  6:00 PM   Specimen: BLOOD  Result Value Ref Range Status   Specimen Description BLOOD RIGHT PORT  Final   Special Requests   Final    BOTTLES DRAWN AEROBIC AND ANAEROBIC Blood Culture adequate volume   Culture   Final    NO GROWTH 2 DAYS Performed at [facility], [address] [place], [place] [address]    Report Status PENDING  Incomplete  Urine Culture     Status: Abnormal   Collection Time: 06/02/20  6:00 PM   Specimen: Urine, Random  Result Value Ref Range Status   Specimen Description   Final    URINE, RANDOM Performed at [facility], [address], [place], [place] [address]    Special Requests   Final    NONE Performed at [facility], [address] [place], [place] [address]    Culture MULTIPLE SPECIES PRESENT, SUGGEST RECOLLECTION (A)  Final   Report Status 06/04/2020 FINAL  Final  Aerobic/Anaerobic Culture (surgical/deep wound)     Status: None (Preliminary result)   Collection Time: 06/03/20 11:14 AM   Specimen: Wound  Result Value Ref Range Status   Specimen Description   Final    WOUND Performed at [facility], [address], [place], [place] [address]  Special Requests   Final    NONE Performed at [facility], [address] [place], [place] [address]    Gram Stain   Final    RARE WBC PRESENT, PREDOMINANTLY PMN FEW GRAM POSITIVE COCCI IN PAIRS IN CLUSTERS    Culture   Final    CULTURE REINCUBATED FOR BETTER GROWTH Performed at [facility], [place] [address] [place], [place] [address]    Report Status PENDING  Incomplete    Coagulation Studies: No results for input(s): LABPROT, INR in the last 72 hours.  Urinalysis: Recent Labs    06/02/20 1800  COLORURINE YELLOW*  LABSPEC 1.010  PHURINE 6.0  GLUCOSEU 50*  HGBUR NEGATIVE  BILIRUBINUR NEGATIVE  KETONESUR NEGATIVE  PROTEINUR 100*  NITRITE NEGATIVE  LEUKOCYTESUR NEGATIVE      Imaging: CT Chest Wo Contrast  Result Date: 06/02/2020 CLINICAL DATA:  64 year old female with a history of shortness of breath, metastatic breast cancer EXAM: CT CHEST WITHOUT CONTRAST TECHNIQUE: Multidetector CT imaging of the chest was performed following the standard protocol without IV contrast.  COMPARISON:  PET-CT 03/28/2020 FINDINGS: Cardiovascular: Heart size is unchanged. No pericardial fluid. Normal course caliber and contour of the thoracic aorta. No significant atherosclerotic changes. Right IJ port catheter. Mediastinum/Nodes: Unremarkable thyroid/thoracic inlet. Unremarkable thoracic esophagus. The subcarinal node node measures smaller than the comparison PET CT, previously approximately 10 mm, now 7 mm. Otherwise, no enlarged mediastinal lymph nodes. Lungs/Pleura: Bilateral pleural effusions, predominantly in the dependent aspects of the lungs. There is rim of hyperdensity adjacent to the low-density fluid at the costophrenic sulci and extending to the medial, posterior, and lateral pleural/fluid interface of the bilateral lungs. The fluid extends along the lateral lung to the anterior lung on the left, with small volume of partially trapped fluid. There is hyperdense tissue thickening along the pleural mediastinal interface of the upper mediastinum, more pronounced on the left. The appearance of the nodular thickening is relatively similar to the PET CT dated 03/28/2020. Overall, the volume of fluid appears to have slightly progressed. Nodular lung changes at the periphery of the left upper lobe on image 45 of series 3, left lower lobe on image 86 of series 2, and at the lower right lung, image 75 of series 2 and image 92 of series 2. No evidence of interlobular septal thickening. There is no confluent airspace disease. No endotracheal or endobronchial debris. Upper Abdomen: No acute finding of the upper abdomen. Musculoskeletal: Redemonstration of spiculated soft tissue nodule of the left breast, compatible with the given history of breast cancer. The more inferior aspect of the mass measures slightly larger in AP dimension on the axial CT, previously 27 mm, now 30 mm. Although sclerotic foci within the skeleton are relatively unchanged, there are new/enlarging lytic lesions of the thoracic  spine including: T5 on image 48 of series 2, with new lytic lesion adjacent to the sclerotic focus T6 at the anterior vertebral body, enlarging from the prior T7 at the right vertebral body, mixed lytic and sclerotic, enlarging from the prior T10 at the anterior vertebral body, enlarging T11, posterior vertebral body, enlarging IMPRESSION: Bilateral malignant pleural effusions, which are only slightly larger than the comparison PET CT of 03/28/2020, with associated evidence of visceral/parietal pleural metastases. Redemonstration skeletal metastases, with new/enlarging lytic lesions of the thoracic spine compatible with progression. The left breast mass appears to be enlarging compared to the prior PET. Electronically Signed   By: Corrie Mckusick D.O.   On: 06/02/2020  15:35   DG Chest Port 1 View  Result Date: 06/04/2020 CLINICAL DATA:  Status post thoracentesis from the left. EXAM: PORTABLE CHEST 1 VIEW COMPARISON:  Chest radiograph 06/02/2020. FINDINGS: Right anterior chest wall Port-A-Cath is present with tip projecting over the superior vena cava. Monitoring leads overlie the patient. Stable enlarged cardiac and mediastinal contours. Persistent moderate bilateral pleural effusions and underlying consolidation. No definite pneumothorax. Thoracic spine degenerative changes. IMPRESSION: Persistent moderate bilateral pleural effusions and underlying consolidation. Electronically Signed   By: Lovey Newcomer M.D.   On: 06/04/2020 10:23   DG Foot Complete Left  Result Date: 06/02/2020 CLINICAL DATA:  Osteomyelitis of the left foot. EXAM: LEFT FOOT - COMPLETE 3+ VIEW COMPARISON:  April 03, 2020 FINDINGS: There is no evidence of fracture or dislocation. There is no evidence of cortical destructive lesions. Stable postsurgical changes from transmetatarsal amputation of the fifth ray. Soft tissue swelling of the midfoot. IMPRESSION: 1. No radiographic evidence of osteomyelitis. 2. Soft tissue swelling of the midfoot.  3. Stable postsurgical changes from transmetatarsal amputation of the fifth ray. Electronically Signed   By: Fidela Salisbury M.D.   On: 06/02/2020 16:51   US THORACENTESIS ASP PLEURAL SPACE W/IMG GUIDE  Result Date: 06/04/2020 INDICATION: Patient with metastatic breast cancer, now with bilateral pleural effusions. Request made for diagnostic and therapeutic thoracentesis EXAM: ULTRASOUND GUIDED DIAGNOSTIC AND THERAPEUTIC LEFT THORACENTESIS MEDICATIONS: 10 mL 1% lidocaine COMPLICATIONS: None immediate. PROCEDURE: An ultrasound guided thoracentesis was thoroughly discussed with the patient and questions answered. The benefits, risks, alternatives and complications were also discussed. The patient understands and wishes to proceed with the procedure. Written consent was obtained. Ultrasound was performed to localize and mark an adequate pocket of fluid in the left chest. The area was then prepped and draped in the normal sterile fashion. 1% Lidocaine was used for local anesthesia. Under ultrasound guidance a 6 Fr Safe-T-Centesis catheter was introduced. Thoracentesis was performed. The catheter was removed and a dressing applied. FINDINGS: A total of approximately 500 mL of blood-tinged fluid was removed. Samples were sent to the laboratory as requested by the clinical team. Procedure stopped prior to removal of all fluid due to patient's complaint of discomfort. IMPRESSION: Successful ultrasound guided left thoracentesis yielding 500 mL of pleural fluid. Read by: Brynda Greathouse PA-C Electronically Signed   By: Markus Daft M.D.   On: 06/04/2020 11:11     Medications:   . sodium chloride     . sodium chloride   Intravenous Once  . sodium chloride   Intravenous Once  . amLODipine  10 mg Oral Daily  . atenolol  50 mg Oral Daily  . budesonide (PULMICORT) nebulizer solution  0.25 mg Nebulization BID  . bumetanide  0.5 mg Oral BID  . calcitRIOL  0.25 mcg Oral Daily  . cloNIDine  0.2 mg Oral BID  .  docusate sodium  100 mg Oral Daily  . [START ON 06/05/2020] famotidine  20 mg Oral Daily  . feeding supplement (GLUCERNA SHAKE)  237 mL Oral BID BM  . ferrous sulfate  325 mg Oral Q breakfast  . FLUoxetine  20 mg Oral BID  . gabapentin  100 mg Oral QHS  . insulin aspart  0-15 Units Subcutaneous TID WC  . simvastatin  20 mg Oral QHS  . sodium chloride flush  3 mL Intravenous Q12H   sodium chloride, albuterol, ALPRAZolam, bisacodyl, ondansetron **OR** ondansetron (ZOFRAN) IV, oxyCODONE-acetaminophen, senna, sodium chloride flush  Assessment/ Plan:  [name]  is a 64 y.o.  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 Chronic kidney disease stage IV   Creatinine today is 2.42 GFR 22  2. Hypertension Normotensive, BP 116/72 Continue current antihypertensive regimen  3.  Anemia of CKD and chemotherapy induced  Hemoglobin 7. 6 today  Received PRBC infusion last night  4. Bilateral pleural effusions  -Patient underwent diagnostic and therapeutic Lt.thoracentesis with 500 ml of fluid removal. -Reports symptoms improved after the procedure            LOS: 2 Emilea Goga 11/1/20211:56 PM

## 2020-06-04 NOTE — Telephone Encounter (Signed)
x

## 2020-06-05 ENCOUNTER — Telehealth: Payer: Self-pay | Admitting: Internal Medicine

## 2020-06-05 ENCOUNTER — Inpatient Hospital Stay: Payer: Medicare HMO

## 2020-06-05 DIAGNOSIS — J910 Malignant pleural effusion: Secondary | ICD-10-CM | POA: Diagnosis not present

## 2020-06-05 LAB — BASIC METABOLIC PANEL
Anion gap: 9 (ref 5–15)
BUN: 42 mg/dL — ABNORMAL HIGH (ref 8–23)
CO2: 27 mmol/L (ref 22–32)
Calcium: 8.7 mg/dL — ABNORMAL LOW (ref 8.9–10.3)
Chloride: 102 mmol/L (ref 98–111)
Creatinine, Ser: 2.42 mg/dL — ABNORMAL HIGH (ref 0.44–1.00)
GFR, Estimated: 22 mL/min — ABNORMAL LOW (ref >60)
Glucose, Bld: 108 mg/dL — ABNORMAL HIGH (ref 70–99)
Potassium: 4.7 mmol/L (ref 3.5–5.1)
Sodium: 138 mmol/L (ref 135–145)

## 2020-06-05 LAB — CBC WITH DIFFERENTIAL/PLATELET
Abs Immature Granulocytes: 0.87 10*3/uL — ABNORMAL HIGH (ref 0.00–0.07)
Basophils Absolute: 0.1 10*3/uL (ref 0.0–0.1)
Basophils Relative: 1 %
Eosinophils Absolute: 0 10*3/uL (ref 0.0–0.5)
Eosinophils Relative: 0 %
HCT: 29.4 % — ABNORMAL LOW (ref 36.0–46.0)
Hemoglobin: 9.6 g/dL — ABNORMAL LOW (ref 12.0–15.0)
Immature Granulocytes: 9 %
Lymphocytes Relative: 6 %
Lymphs Abs: 0.6 10*3/uL — ABNORMAL LOW (ref 0.7–4.0)
MCH: 28.9 pg (ref 26.0–34.0)
MCHC: 32.7 g/dL (ref 30.0–36.0)
MCV: 88.6 fL (ref 80.0–100.0)
Monocytes Absolute: 1.3 10*3/uL — ABNORMAL HIGH (ref 0.1–1.0)
Monocytes Relative: 13 %
Neutro Abs: 7.2 10*3/uL (ref 1.7–7.7)
Neutrophils Relative %: 71 %
Platelets: 418 10*3/uL — ABNORMAL HIGH (ref 150–400)
RBC: 3.32 MIL/uL — ABNORMAL LOW (ref 3.87–5.11)
RDW: 17.2 % — ABNORMAL HIGH (ref 11.5–15.5)
Smear Review: NORMAL
WBC: 10.1 10*3/uL (ref 4.0–10.5)
nRBC: 1 % — ABNORMAL HIGH (ref 0.0–0.2)

## 2020-06-05 LAB — GLUCOSE, CAPILLARY
Glucose-Capillary: 135 mg/dL — ABNORMAL HIGH (ref 70–99)
Glucose-Capillary: 162 mg/dL — ABNORMAL HIGH (ref 70–99)
Glucose-Capillary: 196 mg/dL — ABNORMAL HIGH (ref 70–99)

## 2020-06-05 LAB — LACTATE DEHYDROGENASE, PLEURAL OR PERITONEAL FLUID: LD, Fluid: 277 U/L — ABNORMAL HIGH (ref 3–23)

## 2020-06-05 LAB — TYPE AND SCREEN
ABO/RH(D): A POS
Antibody Screen: NEGATIVE
Unit division: 0
Unit division: 0

## 2020-06-05 LAB — AMYLASE, PLEURAL OR PERITONEAL FLUID: Amylase, Fluid: 32 U/L

## 2020-06-05 LAB — BPAM RBC
Blood Product Expiration Date: 202111202359
Blood Product Expiration Date: 202111212359
ISSUE DATE / TIME: 202110311758
ISSUE DATE / TIME: 202111011425
Unit Type and Rh: 6200
Unit Type and Rh: 6200

## 2020-06-05 LAB — PH, BODY FLUID: pH, Body Fluid: 7.5

## 2020-06-05 LAB — BODY FLUID CELL COUNT WITH DIFFERENTIAL
Eos, Fluid: 1 %
Lymphs, Fluid: 24 %
Monocyte-Macrophage-Serous Fluid: 1 %
Neutrophil Count, Fluid: 74 %
Total Nucleated Cell Count, Fluid: 6244 cu mm

## 2020-06-05 LAB — PROTEIN, PLEURAL OR PERITONEAL FLUID: Total protein, fluid: 3.7 g/dL

## 2020-06-05 LAB — GLUCOSE, PLEURAL OR PERITONEAL FLUID: Glucose, Fluid: 101 mg/dL

## 2020-06-05 LAB — CYTOLOGY - NON PAP

## 2020-06-05 MED ORDER — BUMETANIDE 0.25 MG/ML IJ SOLN
1.0000 mg | Freq: Once | INTRAMUSCULAR | Status: AC
  Administered 2020-06-05: 1 mg via INTRAVENOUS
  Filled 2020-06-05: qty 4

## 2020-06-05 MED ORDER — IPRATROPIUM-ALBUTEROL 0.5-2.5 (3) MG/3ML IN SOLN: 3.0000 mL | RESPIRATORY_TRACT | Status: DC | PRN

## 2020-06-05 NOTE — Telephone Encounter (Signed)
On 11/1-I spoke to patient's son Lanny Hurst regarding patient's difficult situation-progressive malignancy; poor tolerance to chemotherapy; multiple comorbidities including chronic kidney disease; severe anemia; infection-osteomyelitis issues/diabetes.  Discussed that patient is high risk of complications from ongoing chemotherapy; the response to therapy would be modest at best.  The son plans to be available for the family meeting this afternoon.

## 2020-06-05 NOTE — Procedures (Signed)
PROCEDURE SUMMARY:  Successful US guided right thoracentesis. Yielded 550 mL of blood tinged fluid. Pt tolerated procedure well. No immediate complications.  Specimen was sent for labs. CXR ordered.  EBL < 5 mL  Ascencion Dike PA-C 06/05/2020 11:45 AM

## 2020-06-05 NOTE — Progress Notes (Signed)
[name]   DOB:08/11/1955   MR#:[id number]    Subjective: Patient had thoracentesis yesterday; contralateral thoracentesis today.  States her breathing is improved.  No fever no chills.  Continues with oxygen.  Objective:  Vitals:   06/05/20 1115 06/05/20 1145  BP: (!) 154/91 (!) 145/80  Pulse: 83 84  Resp:    Temp:    SpO2: 100% 100%     Intake/Output Summary (Last 24 hours) at 06/05/2020 1555 Last data filed at 06/05/2020 0900 Gross per 24 hour  Intake 420 ml  Output 200 ml  Net 220 ml    Physical Exam Constitutional:      Comments: Pt is resting in the bed; on [place] 3 lits. accompanied by his son.   HENT:     Head: Normocephalic and atraumatic.     Mouth/Throat:     Pharynx: No oropharyngeal exudate.  Eyes:     Pupils: Pupils are equal, round, and reactive to light.  Cardiovascular:     Rate and Rhythm: Normal rate and regular rhythm.  Pulmonary:     Effort: No respiratory distress.     Breath sounds: No wheezing.     Comments: Decreased breath sounds bilaterally at bases.  Abdominal:     General: Bowel sounds are normal. There is no distension.     Palpations: Abdomen is soft. There is no mass.     Tenderness: There is no abdominal tenderness. There is no guarding or rebound.  Musculoskeletal:        General: No tenderness. Normal range of motion.     Cervical back: Normal range of motion and neck supple.  Skin:    General: Skin is warm.  Neurological:     Mental Status: She is alert and oriented to person, place, and time.  Psychiatric:        Mood and Affect: Affect normal.      Labs:  Lab Results  Component Value Date   WBC 10.1 06/05/2020   HGB 9.6 (L) 06/05/2020   HCT 29.4 (L) 06/05/2020   MCV 88.6 06/05/2020   PLT 418 (H) 06/05/2020   NEUTROABS 7.2 06/05/2020    Lab Results  Component Value Date   NA 138 06/05/2020   K 4.7 06/05/2020   CL 102 06/05/2020   CO2 27 06/05/2020    Studies:  DG Chest Port 1 View  Result Date:  06/05/2020 CLINICAL DATA:  Status post right-sided thoracentesis. EXAM: PORTABLE CHEST 1 VIEW COMPARISON:  One-view chest x-ray 08/05/2019 FINDINGS: Heart is enlarged. Right IJ Port-A-Cath is stable. Right pleural effusion has decreased. No pneumothorax is present. Left pleural effusion persists. Bilateral associated airspace disease is present. Mild edema is present. IMPRESSION: 1. Decrease in right pleural effusion without pneumothorax. 2. Persistent bilateral pleural effusions and bibasilar airspace disease. Electronically Signed   By: Christopher  Mattern M.D.   On: 06/05/2020 12:07   DG Chest Port 1 View  Result Date: 06/04/2020 CLINICAL DATA:  Status post thoracentesis from the left. EXAM: PORTABLE CHEST 1 VIEW COMPARISON:  Chest radiograph 06/02/2020. FINDINGS: Right anterior chest wall Port-A-Cath is present with tip projecting over the superior vena cava. Monitoring leads overlie the patient. Stable enlarged cardiac and mediastinal contours. Persistent moderate bilateral pleural effusions and underlying consolidation. No definite pneumothorax. Thoracic spine degenerative changes. IMPRESSION: Persistent moderate bilateral pleural effusions and underlying consolidation. Electronically Signed   By: Drew  Davis M.D.   On: 06/04/2020 10:23   US THORACENTESIS ASP PLEURAL SPACE W/IMG GUIDE  Result   Date: 06/05/2020 INDICATION: Shortness of breath. History of breast cancer. Right-sided pleural effusion. Request for diagnostic and therapeutic thoracentesis. EXAM: ULTRASOUND GUIDED RIGHT THORACENTESIS MEDICATIONS: 1% plain lidocaine, 5 mL COMPLICATIONS: None immediate. Postprocedural chest x-ray negative for pneumothorax PROCEDURE: An ultrasound guided thoracentesis was thoroughly discussed with the patient and questions answered. The benefits, risks, alternatives and complications were also discussed. The patient understands and wishes to proceed with the procedure. Written consent was obtained. Ultrasound  was performed to localize and mark an adequate pocket of fluid in the right chest. The area was then prepped and draped in the normal sterile fashion. 1% Lidocaine was used for local anesthesia. Under ultrasound guidance a 6 Fr Safe-T-Centesis catheter was introduced. Thoracentesis was performed. The catheter was removed and a dressing applied. FINDINGS: A total of approximately 550 mL of blood-tinged pleural fluid was removed. Samples were sent to the laboratory as requested by the clinical team. IMPRESSION: Successful ultrasound guided right thoracentesis yielding 550 mL of pleural fluid. Read by: Ascencion Dike PA-C Electronically Signed   By: Markus Daft M.D.   On: 06/05/2020 12:16   US THORACENTESIS ASP PLEURAL SPACE W/IMG GUIDE  Result Date: 06/04/2020 INDICATION: Patient with metastatic breast cancer, now with bilateral pleural effusions. Request made for diagnostic and therapeutic thoracentesis EXAM: ULTRASOUND GUIDED DIAGNOSTIC AND THERAPEUTIC LEFT THORACENTESIS MEDICATIONS: 10 mL 1% lidocaine COMPLICATIONS: None immediate. PROCEDURE: An ultrasound guided thoracentesis was thoroughly discussed with the patient and questions answered. The benefits, risks, alternatives and complications were also discussed. The patient understands and wishes to proceed with the procedure. Written consent was obtained. Ultrasound was performed to localize and mark an adequate pocket of fluid in the left chest. The area was then prepped and draped in the normal sterile fashion. 1% Lidocaine was used for local anesthesia. Under ultrasound guidance a 6 Fr Safe-T-Centesis catheter was introduced. Thoracentesis was performed. The catheter was removed and a dressing applied. FINDINGS: A total of approximately 500 mL of blood-tinged fluid was removed. Samples were sent to the laboratory as requested by the clinical team. Procedure stopped prior to removal of all fluid due to patient's complaint of discomfort. IMPRESSION: Successful  ultrasound guided left thoracentesis yielding 500 mL of pleural fluid. Read by: Brynda Greathouse PA-C Electronically Signed   By: Markus Daft M.D.   On: 06/04/2020 11:11    Carcinoma of upper-inner quadrant of left breast in female, estrogen receptor positive ([place]) #64 year old female patient with multiple medical problems history of metastatic stage IV breast cancer ER/PR positive HER-2 negative status post multiple lines of therapy-most recent gemcitabine currently admitted hospital for worsening shortness of breath  #Worsening shortness of breath-multifactorial severe anemia hemoglobin 6.5; bilateral worsening pleural effusion [see below]; s/p paracentesis x2 bilateral.  Await fluid cytology.  #Metastatic left breast cancer ER/PR +, HER-2/neu-currently on gemcitabine chemotherapy s/p cycle number 2-day 1.  October 30 CT scan unfortunately-shows progression of disease-pleural metastases/bilateral effusion; worsening lytic lesions in the bones.  See below  # Anemia-hemoglobin-nadir 6.7 s/p PRBC transfusion hemoglobin-9.5.   # Chronic kidney disease - stage IV [GFR-22]-stable  # Bone mets-sclerotic; Right acetabular uptake-s/p radiation; CT scan shows worsening metastases in the vertebrae; currently asymptomatic stable.  See below  #Leukocytosis-up to 20,000 on admission-question infection on broad-spectrum antibiotics.  Appreciate evaluation by podiatry-no obvious evidence of worsening foot infection or osteomyelitis.  Improving-recommend oral antibiotics at discharge  #Prognosis/treatment plan: I had a long discussion with the patient and her son-regarding the concerns for progressive disease; declining performance status; multiple  comorbidities interfering with her chemotherapy tolerance/discussed side effects.  Discussed that unfortunately it is getting to a point where patient risk of side effects from the treatment are higher compared to the benefits.   #Also discussed the CODE STATUS.   Strongly recommend DNR/DNI given her progressive malignancy in the context of multiple medical problems/declining performance status.  I strongly believe that should consider DNR/DNI.  Patient/son verbalized the understanding of the status of the situation.  Recommend they talk to the rest of the family.   # 40 minutes face-to-face with the patient discussing the above plan of care; more than 50% of time spent on prognosis/ natural history; counseling and coordination.  Discussed with Dr. Amery.  Also discussed with Josh Borders.   Govinda R Brahmanday, MD 06/05/2020  3:55 PM  

## 2020-06-05 NOTE — Progress Notes (Signed)
Advanced Directive education offered.     06/05/20 1800  Clinical Encounter Type  Visited With Patient  Visit Type Initial  Referral From Palliative care team

## 2020-06-05 NOTE — Care Management Important Message (Signed)
Important Message  Patient Details  Name: [name] MRN: [id number] Date of Birth: 08-01-1956   Medicare Important Message Given:  N/A - LOS <3 / Initial given by admissions  Initial Medicare IM reviewed with patient by Brantley Fling, Patient Access Associate on 06/04/2020 at 12:53pm.   Dannette Barbara 06/05/2020, 8:18 AM

## 2020-06-05 NOTE — Progress Notes (Signed)
[facility]  Telephone:([phone] [phone]   Name: [name] Date: 06/05/2020 MRN: [id number]  DOB: 1956/06/09  Patient Care Team: [name], [name], MD as Medical Oncologist (Medical Oncology) Corey Skains, MD as Consulting Physician (Cardiology)    REASON FOR CONSULTATION: [name] is a 64 y.o. female with multiple medical problems including stage IV breast cancer widely metastatic to lungs and bone on multiple previous lines of chemotherapy, stage IV CKD, anemia, history of CVA, and diabetes.  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  Patient is now hospitalized 06/02/2020 with shortness of breath.  She was found to have acute on chronic anemia.  CT of the chest on 06/02/2020 revealed bilateral malignant pleural effusions and worsening lytic lesions in her thoracic spine consistent with disease progression.  Patient is status post left-sided thoracentesis on 11/1 with 500 mL of fluid removed.  Palliative care was consulted up address goals..    CODE STATUS: Full code  PAST MEDICAL HISTORY: Past Medical History:  Diagnosis Date  . Anemia   . Anxiety   . Asthma   . Cancer ([place]) 03/10/2018   Per NM PET order. Carcinoma of upper-inner quadrant of left breast in female, estrogen receptor positive .  [facility] Kitchen Cancer (HCC)    LUNG  . CHF (congestive heart failure) ([place]) 1997  . CKD (chronic kidney disease)   . Depression   . Diabetes mellitus, type 2 ([place])   . Family history of breast cancer   . Family history of colon cancer   . Family history of ovarian cancer   . Family history of pancreatic cancer   . Family history of prostate cancer   . Family history of stomach cancer   . GERD (gastroesophageal reflux disease)    history  of an ulcer  . Hair loss   . History of left breast cancer 05/29/14  . History of partial hysterectomy 12/31/2016   Per patient.  Has not had a period in years.  Had a partial hysterectomy years ago.  Marland Kitchen Hypertension   . Mitral valve regurgitation   . Neuromuscular disorder (HCC)    neuropathies in hand  . Obesity   . Pancreatitis 1997  . Stroke [facility]) 2010   with mild left arm weakness    PAST SURGICAL HISTORY:  Past Surgical History:  Procedure Laterality Date  . AMPUTATION Left 03/30/2020   Procedure: AMPUTATION 5th RAY;  Surgeon: Samara Deist, DPM;  Location: [facility] ORS;  Service: Podiatry;  Laterality: Left;  . APPLICATION OF WOUND VAC Left 03/30/2020   Procedure: APPLICATION OF WOUND VAC;  Surgeon: Samara Deist, DPM;  Location: [facility] ORS;  Service: Podiatry;  Laterality: Left;  . CATARACT EXTRACTION W/PHACO Right 02/24/2019   Procedure: CATARACT EXTRACTION PHACO AND INTRAOCULAR LENS PLACEMENT ([place]) RIGHT DIABETES;  Surgeon: Marchia Meiers, MD;  Location: [facility] ORS;  Service: Ophthalmology;  Laterality: Right;  [place] 01:13.0 CDE 7.96 Fluid Pack Lot # [id number]  . CATARACT EXTRACTION W/PHACO Left 03/24/2019   Procedure: CATARACT EXTRACTION PHACO AND INTRAOCULAR LENS PLACEMENT ([facility]) - left diabetic;  Surgeon: Marchia Meiers, MD;  Location: [facility] OR[facility];  Service: Ophthalmology;  Laterality: Left;  [place]  01:36 CDE 13.93 Fluid pack lot # [id number]  . CENTRAL LINE INSERTION-TUNNELED N/A 04/04/2020   Procedure: CENTRAL LINE INSERTION-TUNNELED;  Surgeon:  Schnier, Dolores Lory, MD;  Location: [place] [facility];  Service: Cardiovascular;  Laterality: N/A;  . CESAREAN SECTION    . CHOLECYSTECTOMY    . DIALYSIS/PERMA CATHETER REMOVAL N/A 05/01/2020   Procedure: DIALYSIS/PERMA CATHETER REMOVAL;  Surgeon: Katha Cabal, MD;  Location: [place] [facility];  Service: Cardiovascular;  Laterality: N/A;  . EXCISION OF TONGUE LESION N/A 08/17/2018   Procedure: EXCISION OF TONGUE LESION WITH FROZEN SECTIONS;   Surgeon: Beverly Gust, MD;  Location: [facility] ORS;  Service: ENT;  Laterality: N/A;  . EYE SURGERY Right    cataract extraction  . IRRIGATION AND DEBRIDEMENT FOOT Left 03/30/2020   Procedure: IRRIGATION AND DEBRIDEMENT FOOT;  Surgeon: Samara Deist, DPM;  Location: [facility] OR[facility];  Service: Podiatry;  Laterality: Left;  . PARTIAL HYSTERECTOMY  12/31/2016   Per patient, she has not had a period in years since she had a partial hysterectomy.  Marland Kitchen PORTA CATH INSERTION    . TUBAL LIGATION      HEMATOLOGY/ONCOLOGY HISTORY:  Oncology History Overview Note  # OCT 2015-STAGE IV LEFT BREAST T2N1 [T=4cm; [name]; [name]; [name]; EBUS- Positive Paratrac/subcarinal LN s/p ? Taxotere [in [place]; [name]; [name];[name]; [name]; suv 3]; [name]; [name]; [name];  [name]; Ant R iliac bone; [name]; [name]; START-Taxol weekly. March 2020- Taxol every 2 weeks [PN]; SEP 2020- PET progression  # SEP 04/29/2019- ERIBULIN s/p RT - Right hip- [s/p RT- NOV 2020]; AUG 2021-PET scan progressive disease ; rising tumor markers.  Discontinued Eribulin- AUG 2021 [left foot osteomyelitis s/p toe amputation; Dr. Antonietta Barcelona. [place]];  # SEP [id number]- GEMCIATBINE [86m/m2]  # Poorly controlled Blood sugars- improved.   # Pancreatitis Hx/PEI- on creon in past / CKD IV [creat ~ 3-4; [name]; [name]; mild left sided weakness]  # Jan 2020-  Lobular lesion on tongue- s/p excision pyogenic granuloma [Dr.McQueen]   # GENETIC  TESTING/COUNSELLING: HETEROZYGOUS Cystic Fibrosis Gene [explains hx of recurrent pancreatitis]  # MOLECULAR TESTING: NA   # PALLIATIVE CARE: 1/22-Discussed/Declined ------------------------------------------------   DIAGNOSIS: [ [address]]BREAST CA; [name]; [name] ;GOALS: Palliative  CURRENT/MOST RECENT THERAPY: GEMCITABINE[C].     Carcinoma of upper-inner quadrant of left [place], [place]  04/29/2019 - 02/24/2020 Chemotherapy   The patient had eriBULin mesylate (HALAVEN) 2 mg in sodium chloride 0.9 % 100 mL chemo infusion, 2 mg, Intravenous,  Once, 12 of 14 cycles Dose modification: 1 mg/m2 (original dose 1 mg/m2, Cycle 1, Reason: Provider Judgment) Administration: 2 mg (06/03/2019), 2 mg (04/29/2019), 2 mg (06/10/2019), 2 mg (07/04/2019), 2 mg (07/11/2019), 2 mg (07/25/2019), 2 mg (08/03/2019), 2 mg (08/19/2019), 2 mg (08/26/2019), 2 mg (09/09/2019), 2 mg (09/16/2019), 2 mg (10/07/2019), 2 mg (10/14/2019), 2 mg (10/28/2019), 2 mg (11/04/2019), 2 mg (11/28/2019), 2 mg (12/09/2019), 2 mg (01/13/2020), 2 mg (01/20/2020), 2 mg (02/02/2020), 2 mg (02/13/2020), 2 mg (02/24/2020)  for chemotherapy treatment.    04/30/2020 -  Chemotherapy   The patient had gemcitabine (GEMZAR) 1,600 mg in sodium chloride 0.9 %  250 mL chemo infusion, 1,596 mg, Intravenous,  Once, 2 of 5 cycles Administration: 1,600 mg (04/30/2020), 1,600 mg (05/07/2020), 1,600 mg (05/28/2020)  for chemotherapy treatment.      ALLERGIES:  is allergic to fish-derived products, sulfamethoxazole-trimethoprim, and chlorhexidine.  MEDICATIONS:  Current Facility-Administered Medications  Medication Dose Route Frequency Provider Last Rate Last Admin  . 0.9 %  sodium chloride infusion (Manually program via Guardrails IV Fluids)   Intravenous Once Fritzi Mandes, MD      . 0.9 %  sodium chloride infusion  250 mL Intravenous PRN Agbata, Tochukwu, MD      . albuterol (VENTOLIN HFA) 108 (90 Base) MCG/ACT inhaler 2  puff  2 puff Inhalation Q6H PRN Agbata, Tochukwu, MD      . ALPRAZolam Duanne Moron) tablet 0.5 mg  0.5 mg Oral BID PRN Agbata, Tochukwu, MD      . amLODipine (NORVASC) tablet 10 mg  10 mg Oral Daily Agbata, Tochukwu, MD   10 mg at 06/05/20 0818  . atenolol (TENORMIN) tablet 50 mg  50 mg Oral Daily Agbata, Tochukwu, MD   50 mg at 06/05/20 0818  . bisacodyl (DULCOLAX) suppository 10 mg  10 mg Rectal Daily PRN Agbata, Tochukwu, MD      . bumetanide (BUMEX) injection 1 mg  1 mg Intravenous Once Kolluru, Sarath, MD      . bumetanide (BUMEX) tablet 0.5 mg  0.5 mg Oral BID Agbata, Tochukwu, MD   0.5 mg at 06/05/20 0818  . calcitRIOL (ROCALTROL) capsule 0.25 mcg  0.25 mcg Oral Daily Agbata, Tochukwu, MD   0.25 mcg at 06/05/20 0818  . cloNIDine (CATAPRES) tablet 0.2 mg  0.2 mg Oral BID Agbata, Tochukwu, MD   0.2 mg at 06/05/20 1401  . docusate sodium (COLACE) capsule 100 mg  100 mg Oral Daily Agbata, Tochukwu, MD   100 mg at 06/05/20 0818  . famotidine (PEPCID) tablet 20 mg  20 mg Oral Daily Nolberto Hanlon, MD   20 mg at 06/05/20 0818  . feeding supplement (GLUCERNA SHAKE) (GLUCERNA SHAKE) liquid 237 mL  237 mL Oral BID BM Fritzi Mandes, MD   237 mL at 06/04/20 1335  . ferrous sulfate tablet 325 mg  325 mg Oral Q breakfast Agbata, Tochukwu, MD   325 mg at 06/05/20 0821  . FLUoxetine (PROZAC) capsule 20 mg  20 mg Oral BID Agbata, Tochukwu, MD   20 mg at 06/05/20 0818  . gabapentin (NEURONTIN) capsule 100 mg  100 mg Oral QHS Agbata, Tochukwu, MD   100 mg at 06/04/20 2126  . insulin aspart (novoLOG) injection 0-15 Units  0-15 Units Subcutaneous TID WC Agbata, Tochukwu, MD   2 Units at 06/05/20 0821  . ipratropium-albuterol (DUONEB) 0.5-2.5 (3) MG/3ML nebulizer solution 3 mL  3 mL Nebulization Q4H PRN Nolberto Hanlon, MD      . ondansetron (ZOFRAN) tablet 4 mg  4 mg Oral Q6H PRN Agbata, Tochukwu, MD       Or  . ondansetron (ZOFRAN) injection 4 mg  4 mg Intravenous Q6H PRN Agbata, Tochukwu, MD   4 mg at 06/03/20 1006  .  oxyCODONE-acetaminophen (PERCOCET/ROXICET) 5-325 MG per tablet 1 tablet  1 tablet Oral Q4H PRN Agbata, Tochukwu, MD   1 tablet at 06/03/20 2148  . senna (SENOKOT) tablet 8.6 mg  1 tablet Oral BID PRN Agbata, Tochukwu, MD      . simvastatin (ZOCOR) tablet 20 mg  20 mg Oral QHS Agbata, Tochukwu, MD   20 mg at 06/04/20 2125  .  sodium chloride flush (NS) 0.9 % injection 3 mL  3 mL Intravenous Q12H Agbata, Tochukwu, MD   3 mL at 06/05/20 0822  . sodium chloride flush (NS) 0.9 % injection 3 mL  3 mL Intravenous PRN Agbata, Tochukwu, MD       Facility-Administered Medications Ordered in Other Encounters  Medication Dose Route Frequency Provider Last Rate Last Admin  . sodium chloride flush (NS) 0.9 % injection 10 mL  10 mL Intravenous PRN Cammie Sickle, MD   10 mL at 01/30/16 1054    VITAL SIGNS: BP (!) 145/80 (BP Location: Right Arm)   Pulse 84   Temp 97.8 F (36.6 C)   Resp 20   Ht $R'5\' 2"'zH$  (1.575 m)   Wt 191 lb (86.6 kg)   SpO2 100%   BMI 34.93 kg/m  Filed Weights   06/02/20 1043  Weight: 191 lb (86.6 kg)    Estimated body mass index is 34.93 kg/m as calculated from the following:   Height as of this encounter: $RemoveBeforeD'5\' 2"'ZQAqbgilQrcRZH$  (1.575 m).   Weight as of this encounter: 191 lb (86.6 kg).  LABS: CBC:    Component Value Date/Time   WBC 10.1 06/05/2020 0515   HGB 9.6 (L) 06/05/2020 0515   HGB 12.5 06/06/2014 1102   HCT 29.4 (L) 06/05/2020 0515   HCT 37.6 06/06/2014 1102   PLT 418 (H) 06/05/2020 0515   PLT 396 06/06/2014 1102   MCV 88.6 06/05/2020 0515   MCV 91 06/06/2014 1102   NEUTROABS 7.2 06/05/2020 0515   NEUTROABS 6.9 (H) 06/06/2014 1102   LYMPHSABS 0.6 (L) 06/05/2020 0515   LYMPHSABS 2.0 06/06/2014 1102   MONOABS 1.3 (H) 06/05/2020 0515   MONOABS 0.7 06/06/2014 1102   EOSABS 0.0 06/05/2020 0515   EOSABS 0.0 06/06/2014 1102   BASOSABS 0.1 06/05/2020 0515   BASOSABS 0.1 06/06/2014 1102   Comprehensive Metabolic Panel:    Component Value Date/Time   NA 138 06/05/2020  0515   NA 130 (L) 06/06/2014 1102   K 4.7 06/05/2020 0515   K 3.9 06/06/2014 1102   CL 102 06/05/2020 0515   CL 95 (L) 06/06/2014 1102   CO2 27 06/05/2020 0515   CO2 28 06/06/2014 1102   BUN 42 (H) 06/05/2020 0515   BUN 17 06/06/2014 1102   CREATININE 2.42 (H) 06/05/2020 0515   CREATININE 1.63 (H) 06/06/2014 1102   GLUCOSE 108 (H) 06/05/2020 0515   GLUCOSE 349 (H) 06/06/2014 1102   CALCIUM 8.7 (L) 06/05/2020 0515   CALCIUM 9.2 06/06/2014 1102   AST 14 (L) 05/28/2020 0829   AST 7 (L) 06/06/2014 1102   ALT 9 05/28/2020 0829   ALT 12 (L) 06/06/2014 1102   ALKPHOS 71 05/28/2020 0829   ALKPHOS 74 06/06/2014 1102   BILITOT 0.5 05/28/2020 0829   BILITOT 0.4 06/06/2014 1102   PROT 6.9 05/28/2020 0829   PROT 8.2 06/06/2014 1102   ALBUMIN 2.6 (L) 05/28/2020 0829   ALBUMIN 3.3 (L) 06/06/2014 1102    RADIOGRAPHIC STUDIES: DG Chest 2 View  Result Date: 06/02/2020 CLINICAL DATA:  Shortness of breath EXAM: CHEST - 2 VIEW COMPARISON:  March 29, 2020 FINDINGS: Stable right Port-A-Cath. No pneumothorax. Bilateral pleural effusions with underlying opacities are similar in the interval. Mild interstitial prominence. No other acute abnormalities or changes. IMPRESSION: 1. Stable bilateral pleural effusions with underlying opacities. 2. Possible mild pulmonary edema/pulmonary venous congestion. Electronically Signed   By: Dorise Bullion III M.D   On: 06/02/2020 11:29  CT Chest Wo Contrast  Result Date: 06/02/2020 CLINICAL DATA:  64 year old female with a history of shortness of breath, metastatic breast cancer EXAM: CT CHEST WITHOUT CONTRAST TECHNIQUE: Multidetector CT imaging of the chest was performed following the standard protocol without IV contrast. COMPARISON:  PET-CT 03/28/2020 FINDINGS: Cardiovascular: Heart size is unchanged. No pericardial fluid. Normal course caliber and contour of the thoracic aorta. No significant atherosclerotic changes. Right IJ port catheter. Mediastinum/Nodes:  Unremarkable thyroid/thoracic inlet. Unremarkable thoracic esophagus. The subcarinal node node measures smaller than the comparison PET CT, previously approximately 10 mm, now 7 mm. Otherwise, no enlarged mediastinal lymph nodes. Lungs/Pleura: Bilateral pleural effusions, predominantly in the dependent aspects of the lungs. There is rim of hyperdensity adjacent to the low-density fluid at the costophrenic sulci and extending to the medial, posterior, and lateral pleural/fluid interface of the bilateral lungs. The fluid extends along the lateral lung to the anterior lung on the left, with small volume of partially trapped fluid. There is hyperdense tissue thickening along the pleural mediastinal interface of the upper mediastinum, more pronounced on the left. The appearance of the nodular thickening is relatively similar to the PET CT dated 03/28/2020. Overall, the volume of fluid appears to have slightly progressed. Nodular lung changes at the periphery of the left upper lobe on image 45 of series 3, left lower lobe on image 86 of series 2, and at the lower right lung, image 75 of series 2 and image 92 of series 2. No evidence of interlobular septal thickening. There is no confluent airspace disease. No endotracheal or endobronchial debris. Upper Abdomen: No acute finding of the upper abdomen. Musculoskeletal: Redemonstration of spiculated soft tissue nodule of the left breast, compatible with the given history of breast cancer. The more inferior aspect of the mass measures slightly larger in AP dimension on the axial CT, previously 27 mm, now 30 mm. Although sclerotic foci within the skeleton are relatively unchanged, there are new/enlarging lytic lesions of the thoracic spine including: T5 on image 48 of series 2, with new lytic lesion adjacent to the sclerotic focus T6 at the anterior vertebral body, enlarging from the prior T7 at the right vertebral body, mixed lytic and sclerotic, enlarging from the prior T10  at the anterior vertebral body, enlarging T11, posterior vertebral body, enlarging IMPRESSION: Bilateral malignant pleural effusions, which are only slightly larger than the comparison PET CT of 03/28/2020, with associated evidence of visceral/parietal pleural metastases. Redemonstration skeletal metastases, with new/enlarging lytic lesions of the thoracic spine compatible with progression. The left breast mass appears to be enlarging compared to the prior PET. Electronically Signed   By: Corrie Mckusick D.O.   On: 06/02/2020 15:35   DG Chest Port 1 View  Result Date: 06/05/2020 CLINICAL DATA:  Status post right-sided thoracentesis. EXAM: PORTABLE CHEST 1 VIEW COMPARISON:  One-view chest x-ray 08/05/2019 FINDINGS: Heart is enlarged. Right IJ Port-A-Cath is stable. Right pleural effusion has decreased. No pneumothorax is present. Left pleural effusion persists. Bilateral associated airspace disease is present. Mild edema is present. IMPRESSION: 1. Decrease in right pleural effusion without pneumothorax. 2. Persistent bilateral pleural effusions and bibasilar airspace disease. Electronically Signed   By: San Morelle M.D.   On: 06/05/2020 12:07   DG Chest Port 1 View  Result Date: 06/04/2020 CLINICAL DATA:  Status post thoracentesis from the left. EXAM: PORTABLE CHEST 1 VIEW COMPARISON:  Chest radiograph 06/02/2020. FINDINGS: Right anterior chest wall Port-A-Cath is present with tip projecting over the superior vena cava. Monitoring leads  overlie the patient. Stable enlarged cardiac and mediastinal contours. Persistent moderate bilateral pleural effusions and underlying consolidation. No definite pneumothorax. Thoracic spine degenerative changes. IMPRESSION: Persistent moderate bilateral pleural effusions and underlying consolidation. Electronically Signed   By: Lovey Newcomer M.D.   On: 06/04/2020 10:23   DG Foot Complete Left  Result Date: 06/02/2020 CLINICAL DATA:  Osteomyelitis of the left foot.  EXAM: LEFT FOOT - COMPLETE 3+ VIEW COMPARISON:  April 03, 2020 FINDINGS: There is no evidence of fracture or dislocation. There is no evidence of cortical destructive lesions. Stable postsurgical changes from transmetatarsal amputation of the fifth ray. Soft tissue swelling of the midfoot. IMPRESSION: 1. No radiographic evidence of osteomyelitis. 2. Soft tissue swelling of the midfoot. 3. Stable postsurgical changes from transmetatarsal amputation of the fifth ray. Electronically Signed   By: Fidela Salisbury M.D.   On: 06/02/2020 16:51   US THORACENTESIS ASP PLEURAL SPACE W/IMG GUIDE  Result Date: 06/05/2020 INDICATION: Shortness of breath. History of breast cancer. Right-sided pleural effusion. Request for diagnostic and therapeutic thoracentesis. EXAM: ULTRASOUND GUIDED RIGHT THORACENTESIS MEDICATIONS: 1% plain lidocaine, 5 mL COMPLICATIONS: None immediate. Postprocedural chest x-ray negative for pneumothorax PROCEDURE: An ultrasound guided thoracentesis was thoroughly discussed with the patient and questions answered. The benefits, risks, alternatives and complications were also discussed. The patient understands and wishes to proceed with the procedure. Written consent was obtained. Ultrasound was performed to localize and mark an adequate pocket of fluid in the right chest. The area was then prepped and draped in the normal sterile fashion. 1% Lidocaine was used for local anesthesia. Under ultrasound guidance a 6 Fr Safe-T-Centesis catheter was introduced. Thoracentesis was performed. The catheter was removed and a dressing applied. FINDINGS: A total of approximately 550 mL of blood-tinged pleural fluid was removed. Samples were sent to the laboratory as requested by the clinical team. IMPRESSION: Successful ultrasound guided right thoracentesis yielding 550 mL of pleural fluid. Read by: Ascencion Dike PA-C Electronically Signed   By: Markus Daft M.D.   On: 06/05/2020 12:16   US THORACENTESIS ASP  PLEURAL SPACE W/IMG GUIDE  Result Date: 06/04/2020 INDICATION: Patient with metastatic breast cancer, now with bilateral pleural effusions. Request made for diagnostic and therapeutic thoracentesis EXAM: ULTRASOUND GUIDED DIAGNOSTIC AND THERAPEUTIC LEFT THORACENTESIS MEDICATIONS: 10 mL 1% lidocaine COMPLICATIONS: None immediate. PROCEDURE: An ultrasound guided thoracentesis was thoroughly discussed with the patient and questions answered. The benefits, risks, alternatives and complications were also discussed. The patient understands and wishes to proceed with the procedure. Written consent was obtained. Ultrasound was performed to localize and mark an adequate pocket of fluid in the left chest. The area was then prepped and draped in the normal sterile fashion. 1% Lidocaine was used for local anesthesia. Under ultrasound guidance a 6 Fr Safe-T-Centesis catheter was introduced. Thoracentesis was performed. The catheter was removed and a dressing applied. FINDINGS: A total of approximately 500 mL of blood-tinged fluid was removed. Samples were sent to the laboratory as requested by the clinical team. Procedure stopped prior to removal of all fluid due to patient's complaint of discomfort. IMPRESSION: Successful ultrasound guided left thoracentesis yielding 500 mL of pleural fluid. Read by: Brynda Greathouse PA-C Electronically Signed   By: Markus Daft M.D.   On: 06/04/2020 11:11    PERFORMANCE STATUS (ECOG) : 2 - Symptomatic, <50% confined to bed  Review of Systems Unless otherwise noted, a complete review of systems is negative.  Physical Exam General: NAD Pulmonary: Unlabored Extremities: no edema, no joint deformities  Skin: no rashes Neurological: Weakness but otherwise nonfocal  IMPRESSION: Dr. Rogue Bussing and I met with patient and son.  Together, we reviewed patient's current medical problems.  She recognizes that recent imaging has shown disease progression.  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  We also discussed CODE STATUS.  DNR/DNI was strongly recommended.  Patient and family plan to discuss decision-making.  We will ask that the chaplain provide patient with ACP documents prior to discharging from the hospital.  Plan is for follow-up in the [facility] after discharge from the hospital to further discuss goals and possible treatment options.  PLAN: -Continue current scope of treatment -Chaplain consult for ACP documents -Will plan follow up in the [facility] following discharge from the hospital   Patient expressed understanding and was in agreement with this plan. She also understands that She can call the clinic at any time with any questions, concerns, or complaints.     Time Total: 30 minutes  Visit consisted of counseling and education dealing with the complex and emotionally intense issues of symptom management and palliative care in the setting of serious and potentially life-threatening illness.Greater than 50%  of this time was spent counseling and coordinating care related to the above assessment and plan.  Signed by: Altha Harm, PhD, NP-C

## 2020-06-05 NOTE — Progress Notes (Signed)
PROGRESS NOTE    [name]  CHE:[id number] DOB: 1955/11/02 DOA: 06/02/2020 PCP: [name], MD    Brief Narrative:  [name] is a 64 y.o. female with medical history significant for stage IV breast cancer on chemotherapy, stage IV chronic kidney disease, depression, hypertension, GERD who presents to the emergency room for evaluation of shortness of breath mostly with exertion which she has had for about a week.  CT scan of the chest without contrast shows bilateral malignant pleural effusions which are only slightly larger in comparison PET/CT with associated evidence of visceral, parietal pleural metastasis.  Redemonstration of skeletal mets with new/enlarging lytic lesions of the thoracic spine compatible with progression.  The left breast mass appears to be enlarged compared to the prior PET.   11/1-s/p left diagnostic and therapeutic thoracentesis, yielded 500 mL of blood-tinged fluid 11/2- plan for Rt thoracentesis today   Consultants:   Hematology, podiatry, nephrology  Procedures: Left thoracentesis, CT  Antimicrobials:       Subjective: Feels sob is getting better.no new complaints. On 3L currently .   Objective: Vitals:   06/05/20 0114 06/05/20 0418 06/05/20 0716 06/05/20 0739  BP: 119/75 (!) 147/84 (!) 145/82   Pulse: 79 86 82 73  Resp: (!) 24 18 (!) 24 20  Temp: 98.6 F (37 C) 98.4 F (36.9 C) 97.8 F (36.6 C)   TempSrc: Oral Oral    SpO2: 100% 98% 100% 98%  Weight:      Height:        Intake/Output Summary (Last 24 hours) at 06/05/2020 0836 Last data filed at 06/04/2020 2128 Gross per 24 hour  Intake 420 ml  Output 200 ml  Net 220 ml   Filed Weights   06/02/20 1043  Weight: 86.6 kg    Examination: Calm, cooperative, nad Decrease b/l bs, no w/r/r rrr s1/s3 no gallop Soft bening +bs B/l edema , left leg wrapped Aaxox3, grossly intact Mood and affect appropriate in current setting  .     Data Reviewed: I have personally  reviewed following labs and imaging studies  CBC: Recent Labs  Lab 06/02/20 1048 06/03/20 0504 06/04/20 0446 06/04/20 1929 06/05/20 0515  WBC 20.5* 15.1* 6.6  --  10.1  NEUTROABS  --   --  4.6  --  7.2  HGB 7.9* 6.4* 7.6* 9.2* 9.6*  HCT 24.4* 19.5* 23.3* 29.7* 29.4*  MCV 90.0 89.9 90.0  --  88.6  PLT 596* 445* 417*  --  536*   Basic Metabolic Panel: Recent Labs  Lab 06/02/20 1048 06/03/20 0504 06/04/20 0446 06/05/20 0515  NA 136 137  --  138  K 4.5 4.2  --  4.7  CL 98 101  --  102  CO2 25 27  --  27  GLUCOSE 267* 80  --  108*  BUN 50* 46*  --  42*  CREATININE 3.17* 2.73* 2.42* 2.42*  CALCIUM 8.5* 8.3*  --  8.7*   GFR: Estimated Creatinine Clearance: 24.3 mL/min (A) (by C-G formula based on SCr of 2.42 mg/dL (H)). Liver Function Tests: No results for input(s): AST, ALT, ALKPHOS, BILITOT, PROT, ALBUMIN in the last 168 hours. No results for input(s): LIPASE, AMYLASE in the last 168 hours. No results for input(s): AMMONIA in the last 168 hours. Coagulation Profile: No results for input(s): INR, PROTIME in the last 168 hours. Cardiac Enzymes: No results for input(s): CKTOTAL, CKMB, CKMBINDEX, TROPONINI in the last 168 hours. BNP (last 3 results) No results  for input(s): PROBNP in the last 8760 hours. HbA1C: No results for input(s): HGBA1C in the last 72 hours. CBG: Recent Labs  Lab 06/04/20 0813 06/04/20 1113 06/04/20 1719 06/04/20 2124 06/05/20 0753  GLUCAP 170* 215* 142* 109* 135*   Lipid Profile: No results for input(s): CHOL, HDL, LDLCALC, TRIG, CHOLHDL, LDLDIRECT in the last 72 hours. Thyroid Function Tests: No results for input(s): TSH, T4TOTAL, FREET4, T3FREE, THYROIDAB in the last 72 hours. Anemia Panel: No results for input(s): VITAMINB12, FOLATE, FERRITIN, TIBC, IRON, RETICCTPCT in the last 72 hours. Sepsis Labs: Recent Labs  Lab 06/02/20 1048  PROCALCITON 0.47    Recent Results (from the past 240 hour(s))  Respiratory Panel by RT PCR (Flu  A&B, Covid) - Nasopharyngeal Swab     Status: None   Collection Time: 06/02/20  2:08 PM   Specimen: Nasopharyngeal Swab  Result Value Ref Range Status   SARS Coronavirus 2 by RT PCR NEGATIVE NEGATIVE Final    Comment: (NOTE) SARS-CoV-2 target nucleic acids are NOT DETECTED.  The SARS-CoV-2 RNA is generally detectable in upper respiratoy specimens during the acute phase of infection. The lowest concentration of SARS-CoV-2 viral copies this assay can detect is 131 copies/mL. A negative result does not preclude SARS-Cov-2 infection and should not be used as the sole basis for treatment or other patient management decisions. A negative result may occur with  improper specimen collection/handling, submission of specimen other than nasopharyngeal swab, presence of viral mutation(s) within the areas targeted by this assay, and inadequate number of viral copies (<131 copies/mL). A negative result must be combined with clinical observations, patient history, and epidemiological information. The expected result is Negative.  Fact Sheet for Patients:  PinkCheek.be  Fact Sheet for Healthcare Providers:  GravelBags.it  This test is no t yet approved or cleared by the [place] FDA and  has been authorized for detection and/or diagnosis of SARS-CoV-2 by FDA under an Emergency Use Authorization (EUA). This EUA will remain  in effect (meaning this test can be used) for the duration of the COVID-19 declaration under Section 564(b)(1) of the Act, 21 U.S.C. section 360bbb-3(b)(1), unless the authorization is terminated or revoked sooner.     Influenza A by PCR NEGATIVE NEGATIVE Final   Influenza B by PCR NEGATIVE NEGATIVE Final    Comment: (NOTE) The Xpert Xpress SARS-CoV-2/FLU/RSV assay is intended as an aid in  the diagnosis of influenza from Nasopharyngeal swab specimens and  should not be used as a sole basis for treatment. Nasal  washings and  aspirates are unacceptable for Xpert Xpress SARS-CoV-2/FLU/RSV  testing.  Fact Sheet for Patients: PinkCheek.be  Fact Sheet for Healthcare Providers: GravelBags.it  This test is not yet approved or cleared by the [place] FDA and  has been authorized for detection and/or diagnosis of SARS-CoV-2 by  FDA under an Emergency Use Authorization (EUA). This EUA will remain  in effect (meaning this test can be used) for the duration of the  Covid-19 declaration under Section 564(b)(1) of the Act, 21  U.S.C. section 360bbb-3(b)(1), unless the authorization is  terminated or revoked. Performed at [facility], [address] [place], [place] [address]   Culture, blood (Routine X 2) w Reflex to ID Panel     Status: None (Preliminary result)   Collection Time: 06/02/20  4:32 PM   Specimen: BLOOD  Result Value Ref Range Status   Specimen Description BLOOD RIGHT PORT  Final   Special Requests   Final  BOTTLES DRAWN AEROBIC AND ANAEROBIC Blood Culture adequate volume   Culture   Final    NO GROWTH 3 DAYS Performed at [facility], [address] [place], [place] [address]    Report Status PENDING  Incomplete  Culture, blood (Routine X 2) w Reflex to ID Panel     Status: None (Preliminary result)   Collection Time: 06/02/20  6:00 PM   Specimen: BLOOD  Result Value Ref Range Status   Specimen Description BLOOD RIGHT PORT  Final   Special Requests   Final    BOTTLES DRAWN AEROBIC AND ANAEROBIC Blood Culture adequate volume   Culture   Final    NO GROWTH 3 DAYS Performed at [facility], [address], [place], [place] [address]    Report Status PENDING  Incomplete  Urine Culture     Status: Abnormal   Collection Time: 06/02/20  6:00 PM   Specimen: Urine, Random  Result Value Ref Range Status   Specimen Description   Final    URINE, RANDOM Performed at [facility],  [address], [place], [place] [address]    Special Requests   Final    NONE Performed at [facility], [address], [place], [place] [address]    Culture MULTIPLE SPECIES PRESENT, SUGGEST RECOLLECTION (A)  Final   Report Status 06/04/2020 FINAL  Final  Aerobic/Anaerobic Culture (surgical/deep wound)     Status: None (Preliminary result)   Collection Time: 06/03/20 11:14 AM   Specimen: Wound  Result Value Ref Range Status   Specimen Description   Final    WOUND Performed at [facility], [address] [place], [place] [address]    Special Requests   Final    NONE Performed at [facility], [address] [place], [place] [address]    Gram Stain   Final    RARE WBC PRESENT, PREDOMINANTLY PMN FEW GRAM POSITIVE COCCI IN PAIRS IN CLUSTERS    Culture   Final    CULTURE REINCUBATED FOR BETTER GROWTH Performed at [facility], [place] [address] [place], [place] [address]    Report Status PENDING  Incomplete  Body fluid culture     Status: None (Preliminary result)   Collection Time: 06/04/20  9:45 AM   Specimen: PATH Cytology Pleural fluid  Result Value Ref Range Status   Specimen Description   Final    PLEURAL Performed at [facility], [address] [place], [place] [address]    Special Requests   Final    NONE Performed at [facility], [place], [place] [address]    Gram Stain NO WBC SEEN NO ORGANISMS SEEN   Final   Culture   Final    NO GROWTH < 24 HOURS Performed at [facility], [place], [place],  [address]    Report Status PENDING  Incomplete         Radiology Studies: DG Chest Port 1 View  Result Date: 06/04/2020 CLINICAL DATA:  Status post thoracentesis from the left. EXAM: PORTABLE CHEST 1 VIEW COMPARISON:  Chest radiograph 06/02/2020. FINDINGS: Right anterior chest wall Port-A-Cath is present with tip projecting over the superior vena cava. Monitoring  leads overlie the patient. Stable enlarged cardiac and mediastinal contours. Persistent moderate bilateral pleural effusions and underlying consolidation. No definite pneumothorax. Thoracic spine degenerative changes. IMPRESSION: Persistent moderate bilateral pleural effusions and underlying consolidation. Electronically Signed   By: Dian Situ  Rosana Hoes M.D.   On: 06/04/2020 10:23   US THORACENTESIS ASP PLEURAL SPACE W/IMG GUIDE  Result Date: 06/04/2020 INDICATION: Patient with metastatic breast cancer, now with bilateral pleural effusions. Request made for diagnostic and therapeutic thoracentesis EXAM: ULTRASOUND GUIDED DIAGNOSTIC AND THERAPEUTIC LEFT THORACENTESIS MEDICATIONS: 10 mL 1% lidocaine COMPLICATIONS: None immediate. PROCEDURE: An ultrasound guided thoracentesis was thoroughly discussed with the patient and questions answered. The benefits, risks, alternatives and complications were also discussed. The patient understands and wishes to proceed with the procedure. Written consent was obtained. Ultrasound was performed to localize and mark an adequate pocket of fluid in the left chest. The area was then prepped and draped in the normal sterile fashion. 1% Lidocaine was used for local anesthesia. Under ultrasound guidance a 6 Fr Safe-T-Centesis catheter was introduced. Thoracentesis was performed. The catheter was removed and a dressing applied. FINDINGS: A total of approximately 500 mL of blood-tinged fluid was removed. Samples were sent to the laboratory as requested by the clinical team. Procedure stopped prior to removal of all fluid due to patient's complaint of discomfort. IMPRESSION: Successful ultrasound guided left thoracentesis yielding 500 mL of pleural fluid. Read by: Brynda Greathouse PA-C Electronically Signed   By: Markus Daft M.D.   On: 06/04/2020 11:11        Scheduled Meds: . sodium chloride   Intravenous Once  . amLODipine  10 mg Oral Daily  . atenolol  50 mg Oral Daily  . budesonide  (PULMICORT) nebulizer solution  0.25 mg Nebulization BID  . bumetanide  0.5 mg Oral BID  . calcitRIOL  0.25 mcg Oral Daily  . cloNIDine  0.2 mg Oral BID  . docusate sodium  100 mg Oral Daily  . famotidine  20 mg Oral Daily  . feeding supplement (GLUCERNA SHAKE)  237 mL Oral BID BM  . ferrous sulfate  325 mg Oral Q breakfast  . FLUoxetine  20 mg Oral BID  . gabapentin  100 mg Oral QHS  . insulin aspart  0-15 Units Subcutaneous TID WC  . simvastatin  20 mg Oral QHS  . sodium chloride flush  3 mL Intravenous Q12H   Continuous Infusions: . sodium chloride      Assessment & Plan:   Principal Problem:   Malignant pleural effusion Active Problems:   Benign essential HTN   Anxiety and depression   Type 2 diabetes mellitus with diabetic chronic kidney disease (HCC)   Carcinoma of upper-inner quadrant of left breast in female, estrogen receptor positive ([place])   Metastasis from malignant tumor of breast (HCC)   Antineoplastic chemotherapy induced anemia   Diabetic foot ulcer ([place])   Leukocytosis   Palliative care encounter   Yolette A Mebaneis a 64 y.o.femalewith medical history significant forstage IV breast cancer on chemotherapy, stage IV chronic kidney disease, depression, hypertension, GERD who presents to the emergency room for evaluation of shortness of breath mostly with exertion which she has had for about a week.   Malignant pleural effusion, Bilateral Secondary to known metastatic breast cancer -Patient presents for evaluation of exertional shortness of breath and at rest has room air pulse oximetry of 92% which dropped to 89% with conversation 11/2- s/p Lt thoracentesis on 11/1 Consulted IR for Rt thoracentesis earlier-status post 550 blood-tinged fluid removed. x-ray ordered, will f/u Currently on 3 L , keeping sat >92% Given the nature of pleural effusion due to malignancy may need Pleurx catheter or pleurodesis to prevent reaccumulation, will discuss with  hematology Nephrology gave patient Bumex  IV 1     Metastatic breast cancer Patient with known stage IV breast cancer -CT scan of the chest without contrast showsbilateral malignant pleural effusions, which are only slightly larger than the comparison PET CT of 03/28/2020, with associated evidence of visceral/parietal pleural metastases. -Re demonstrationofskeletal metastases, with new/enlarging lytic lesions of the thoracic spine compatible with progression.The left breast mass appears to be enlarging compared to the prior PET. 11/2-Per oncology there will be a family meeting this afternoon since patient is in a difficult situation with progressive malignancy, poor tolerance to chemotherapy, multiple comorbidities including chronic kidney disease, severe anemia, infection and etc.  Patient at risk of complications from ongoing chemotherapy the response to therapy would be modest at best. Palliative care was consulted and following please see note Will follow up with oncology   Diabetes mellitus with complications of stage IV chronic kidney disease Glucose stable  Continue R-ISS     anemia of chronic disease multifactorial:antineoplastic/poor PO intake/cancer  -Hemoglobin  on admission is 7.9g/dl-- 6.4--1 unit BT on 10/31 11/1-oncology via chat recommended 1 unit PRBC today 11/2-hemoglobin stable at 9.6 Transfuse if hemoglobin less than 7   Hypertension -Overall stable, continue Bumex, clonidine, atenolol     Depression and anxiety -Continue alprazolam and fluoxetine    History of diastolic dysfunction CHF Continue atenolol,  Bumex  Leukocytosis -Unclear etiology improved 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 Wound cx sent off. Redressed wound.  Continue empiric abx, per podiatry at this time  does not see any obvious signs of infection to the left foot based on clinical and radiographic examination .  If other potential concerns eyes and no other sources of infection are noted then could potentially obtain an MRI of the left foot but do not believe it warrants it at this time based on the clinical and radiographic picture so far. Pt should be using Prevalon boots.  When in bed to avoid pressure type ulcerations.  Orders were placed by podiatry. Culture with multiple organisms Afebrile, at this time antibiotics have been held   CKD stage IV- creatinine 2.42 Nephrology giving one dose of bumex iv today Monitor levels   DVT prophylaxis: SCD Code Status: Full Family Communication: None at bedside  Status is: Inpatient  Remains inpatient appropriate because:Inpatient level of care appropriate due to severity of illness   Dispo: The patient is from: Home              Anticipated d/c is to: Home              Anticipated d/c date is: 2 days              Patient currently is not medically stable to d/c.needing thoracentesis today, family meeting, getting iv med.  Need to f/u with hematology             LOS: 3 days   Time spent: 35 minutes with more than 50% on La Ward, MD [facility] Pager [phone]  If 7PM-7AM, please contact night-coverage [email or web address] Password [facility] 06/05/2020, 8:36 AM

## 2020-06-05 NOTE — Progress Notes (Signed)
[facility]  ROUNDING NOTE   Subjective:   Patient sitting up on a recliner, in no acute respiratory distress. She is waiting thoracentesis today.  Objective:  Vital signs in last 24 hours:  Temp:  [97.8 F (36.6 C)-98.9 F (37.2 C)] 97.8 F (36.6 C) (11/02 0716) Pulse Rate:  [72-86] 84 (11/02 1145) Resp:  [18-24] 20 (11/02 0739) BP: (96-154)/(66-91) 145/80 (11/02 1145) SpO2:  [98 %-100 %] 100 % (11/02 1145)  Weight change:  Filed Weights   06/02/20 1043  Weight: 86.6 kg    Intake/Output: I/O last 3 completed shifts: In: 711 [I.V.:10; Blood:701] Out: 500 [Urine:500]   Intake/Output this shift:  No intake/output data recorded.  Physical Exam: General: In no acute distress  Head:  Normocephalic,Atraumatic  Eyes: Anicteric  Neck: trachea at midline  Lungs:  Lungs with crackles, diminished at the bases ,Respirations even,unlabored  Heart: Regular  Abdomen:  Soft, nontender, non distended  Extremities:  2+ peripheral edema.  Neurologic: Speech clear and appropriate  Skin: No acute lesions or rashes       Basic Metabolic Panel: Recent Labs  Lab 06/02/20 1048 06/03/20 0504 06/04/20 0446 06/05/20 0515  NA 136 137  --  138  K 4.5 4.2  --  4.7  CL 98 101  --  102  CO2 25 27  --  27  GLUCOSE 267* 80  --  108*  BUN 50* 46*  --  42*  CREATININE 3.17* 2.73* 2.42* 2.42*  CALCIUM 8.5* 8.3*  --  8.7*    Liver Function Tests: No results for input(s): AST, ALT, ALKPHOS, BILITOT, PROT, ALBUMIN in the last 168 hours. No results for input(s): LIPASE, AMYLASE in the last 168 hours. No results for input(s): AMMONIA in the last 168 hours.  CBC: Recent Labs  Lab 06/02/20 1048 06/03/20 0504 06/04/20 0446 06/04/20 1929 06/05/20 0515  WBC 20.5* 15.1* 6.6  --  10.1  NEUTROABS  --   --  4.6  --  7.2  HGB 7.9* 6.4* 7.6* 9.2* 9.6*  HCT 24.4* 19.5* 23.3* 29.7* 29.4*  MCV 90.0 89.9 90.0  --  88.6  PLT 596* 445* 417*  --  418*    Cardiac Enzymes: No  results for input(s): CKTOTAL, CKMB, CKMBINDEX, TROPONINI in the last 168 hours.  BNP: Invalid input(s): POCBNP  CBG: Recent Labs  Lab 06/04/20 0813 06/04/20 1113 06/04/20 1719 06/04/20 2124 06/05/20 0753  GLUCAP 170* 215* 142* 109* 135*    Microbiology: Results for orders placed or performed during the hospital encounter of 06/02/20  Respiratory Panel by RT PCR (Flu A&B, Covid) - Nasopharyngeal Swab     Status: None   Collection Time: 06/02/20  2:08 PM   Specimen: Nasopharyngeal Swab  Result Value Ref Range Status   SARS Coronavirus 2 by RT PCR NEGATIVE NEGATIVE Final    Comment: (NOTE) SARS-CoV-2 target nucleic acids are NOT DETECTED.  The SARS-CoV-2 RNA is generally detectable in upper respiratoy specimens during the acute phase of infection. The lowest concentration of SARS-CoV-2 viral copies this assay can detect is 131 copies/mL. A negative result does not preclude SARS-Cov-2 infection and should not be used as the sole basis for treatment or other patient management decisions. A negative result may occur with  improper specimen collection/handling, submission of specimen other than nasopharyngeal swab, presence of viral mutation(s) within the areas targeted by this assay, and inadequate number of viral copies (<131 copies/mL). A negative result must be combined with clinical observations, patient history,  and epidemiological information. The expected result is Negative.  Fact Sheet for Patients:  PinkCheek.be  Fact Sheet for Healthcare Providers:  GravelBags.it  This test is no t yet approved or cleared by the [place] FDA and  has been authorized for detection and/or diagnosis of SARS-CoV-2 by FDA under an Emergency Use Authorization (EUA). This EUA will remain  in effect (meaning this test can be used) for the duration of the COVID-19 declaration under Section 564(b)(1) of the Act, 21  U.S.C. section 360bbb-3(b)(1), unless the authorization is terminated or revoked sooner.     Influenza A by PCR NEGATIVE NEGATIVE Final   Influenza B by PCR NEGATIVE NEGATIVE Final    Comment: (NOTE) The Xpert Xpress SARS-CoV-2/FLU/RSV assay is intended as an aid in  the diagnosis of influenza from Nasopharyngeal swab specimens and  should not be used as a sole basis for treatment. Nasal washings and  aspirates are unacceptable for Xpert Xpress SARS-CoV-2/FLU/RSV  testing.  Fact Sheet for Patients: PinkCheek.be  Fact Sheet for Healthcare Providers: GravelBags.it  This test is not yet approved or cleared by the [place] FDA and  has been authorized for detection and/or diagnosis of SARS-CoV-2 by  FDA under an Emergency Use Authorization (EUA). This EUA will remain  in effect (meaning this test can be used) for the duration of the  Covid-19 declaration under Section 564(b)(1) of the Act, 21  U.S.C. section 360bbb-3(b)(1), unless the authorization is  terminated or revoked. Performed at [facility], [address] [place], [place] [address]   Culture, blood (Routine X 2) w Reflex to ID Panel     Status: None (Preliminary result)   Collection Time: 06/02/20  4:32 PM   Specimen: BLOOD  Result Value Ref Range Status   Specimen Description BLOOD RIGHT PORT  Final   Special Requests   Final    BOTTLES DRAWN AEROBIC AND ANAEROBIC Blood Culture adequate volume   Culture   Final    NO GROWTH 3 DAYS Performed at [facility], [address], [place], [place] [address]    Report Status PENDING  Incomplete  Culture, blood (Routine X 2) w Reflex to ID Panel     Status: None (Preliminary result)   Collection Time: 06/02/20  6:00 PM   Specimen: BLOOD  Result Value Ref Range Status   Specimen Description BLOOD RIGHT PORT  Final   Special Requests   Final    BOTTLES DRAWN AEROBIC AND ANAEROBIC Blood  Culture adequate volume   Culture   Final    NO GROWTH 3 DAYS Performed at [facility], [address], [place], [place] [address]    Report Status PENDING  Incomplete  Urine Culture     Status: Abnormal   Collection Time: 06/02/20  6:00 PM   Specimen: Urine, Random  Result Value Ref Range Status   Specimen Description   Final    URINE, RANDOM Performed at [facility], [address] [place], [place] [address]    Special Requests   Final    NONE Performed at [facility], [address] [place], [place] [address]    Culture MULTIPLE SPECIES PRESENT, SUGGEST RECOLLECTION (A)  Final   Report Status 06/04/2020 FINAL  Final  Aerobic/Anaerobic Culture (surgical/deep wound)     Status: None (Preliminary result)   Collection Time: 06/03/20 11:14 AM   Specimen: Wound  Result Value Ref Range Status   Specimen Description   Final    WOUND  Performed at [facility], [address] [place], [place] [address]    Special Requests   Final    NONE Performed at [facility], [address] [place], [place] [address]    Gram Stain   Final    RARE WBC PRESENT, PREDOMINANTLY PMN FEW GRAM POSITIVE COCCI IN PAIRS IN CLUSTERS Performed at [facility], [place] [address] [place], [place] [address]    Culture   Final    RARE STAPHYLOCOCCUS AUREUS FEW STAPHYLOCOCCUS EPIDERMIDIS SUSCEPTIBILITIES TO FOLLOW NO ANAEROBES ISOLATED; CULTURE IN PROGRESS FOR 5 DAYS    Report Status PENDING  Incomplete  Body fluid culture     Status: None (Preliminary result)   Collection Time: 06/04/20  9:45 AM   Specimen: PATH Cytology Pleural fluid  Result Value Ref Range Status   Specimen Description   Final    PLEURAL Performed at [facility], [address], [place], [place] [address]    Special Requests   Final    NONE Performed at [facility], [place],  [address]    Gram Stain NO WBC SEEN NO  ORGANISMS SEEN   Final   Culture   Final    NO GROWTH < 24 HOURS Performed at [facility], [address] [place],  [address]    Report Status PENDING  Incomplete    Coagulation Studies: No results for input(s): LABPROT, INR in the last 72 hours.  Urinalysis: Recent Labs    06/02/20 1800  COLORURINE YELLOW*  LABSPEC 1.010  PHURINE 6.0  GLUCOSEU 50*  HGBUR NEGATIVE  BILIRUBINUR NEGATIVE  KETONESUR NEGATIVE  PROTEINUR 100*  NITRITE NEGATIVE  LEUKOCYTESUR NEGATIVE      Imaging: DG Chest Port 1 View  Result Date: 06/05/2020 CLINICAL DATA:  Status post right-sided thoracentesis. EXAM: PORTABLE CHEST 1 VIEW COMPARISON:  One-view chest x-ray 08/05/2019 FINDINGS: Heart is enlarged. Right IJ Port-A-Cath is stable. Right pleural effusion has decreased. No pneumothorax is present. Left pleural effusion persists. Bilateral associated airspace disease is present. Mild edema is present. IMPRESSION: 1. Decrease in right pleural effusion without pneumothorax. 2. Persistent bilateral pleural effusions and bibasilar airspace disease. Electronically Signed   By: San Morelle M.D.   On: 06/05/2020 12:07   DG Chest Port 1 View  Result Date: 06/04/2020 CLINICAL DATA:  Status post thoracentesis from the left. EXAM: PORTABLE CHEST 1 VIEW COMPARISON:  Chest radiograph 06/02/2020. FINDINGS: Right anterior chest wall Port-A-Cath is present with tip projecting over the superior vena cava. Monitoring leads overlie the patient. Stable enlarged cardiac and mediastinal contours. Persistent moderate bilateral pleural effusions and underlying consolidation. No definite pneumothorax. Thoracic spine degenerative changes. IMPRESSION: Persistent moderate bilateral pleural effusions and underlying consolidation. Electronically Signed   By: Lovey Newcomer M.D.   On: 06/04/2020 10:23   US THORACENTESIS ASP PLEURAL SPACE W/IMG GUIDE  Result Date: 06/05/2020 INDICATION: Shortness of breath. History  of breast cancer. Right-sided pleural effusion. Request for diagnostic and therapeutic thoracentesis. EXAM: ULTRASOUND GUIDED RIGHT THORACENTESIS MEDICATIONS: 1% plain lidocaine, 5 mL COMPLICATIONS: None immediate. Postprocedural chest x-ray negative for pneumothorax PROCEDURE: An ultrasound guided thoracentesis was thoroughly discussed with the patient and questions answered. The benefits, risks, alternatives and complications were also discussed. The patient understands and wishes to proceed with the procedure. Written consent was obtained. Ultrasound was performed to localize and mark an adequate pocket of fluid in the right chest. The area was then prepped and draped in the normal  sterile fashion. 1% Lidocaine was used for local anesthesia. Under ultrasound guidance a 6 Fr Safe-T-Centesis catheter was introduced. Thoracentesis was performed. The catheter was removed and a dressing applied. FINDINGS: A total of approximately 550 mL of blood-tinged pleural fluid was removed. Samples were sent to the laboratory as requested by the clinical team. IMPRESSION: Successful ultrasound guided right thoracentesis yielding 550 mL of pleural fluid. Read by: Ascencion Dike PA-C Electronically Signed   By: Markus Daft M.D.   On: 06/05/2020 12:16   US THORACENTESIS ASP PLEURAL SPACE W/IMG GUIDE  Result Date: 06/04/2020 INDICATION: Patient with metastatic breast cancer, now with bilateral pleural effusions. Request made for diagnostic and therapeutic thoracentesis EXAM: ULTRASOUND GUIDED DIAGNOSTIC AND THERAPEUTIC LEFT THORACENTESIS MEDICATIONS: 10 mL 1% lidocaine COMPLICATIONS: None immediate. PROCEDURE: An ultrasound guided thoracentesis was thoroughly discussed with the patient and questions answered. The benefits, risks, alternatives and complications were also discussed. The patient understands and wishes to proceed with the procedure. Written consent was obtained. Ultrasound was performed to localize and mark an adequate  pocket of fluid in the left chest. The area was then prepped and draped in the normal sterile fashion. 1% Lidocaine was used for local anesthesia. Under ultrasound guidance a 6 Fr Safe-T-Centesis catheter was introduced. Thoracentesis was performed. The catheter was removed and a dressing applied. FINDINGS: A total of approximately 500 mL of blood-tinged fluid was removed. Samples were sent to the laboratory as requested by the clinical team. Procedure stopped prior to removal of all fluid due to patient's complaint of discomfort. IMPRESSION: Successful ultrasound guided left thoracentesis yielding 500 mL of pleural fluid. Read by: Brynda Greathouse PA-C Electronically Signed   By: Markus Daft M.D.   On: 06/04/2020 11:11     Medications:   . sodium chloride     . sodium chloride   Intravenous Once  . amLODipine  10 mg Oral Daily  . atenolol  50 mg Oral Daily  . bumetanide (BUMEX) IV  1 mg Intravenous Once  . bumetanide  0.5 mg Oral BID  . calcitRIOL  0.25 mcg Oral Daily  . cloNIDine  0.2 mg Oral BID  . docusate sodium  100 mg Oral Daily  . famotidine  20 mg Oral Daily  . feeding supplement (GLUCERNA SHAKE)  237 mL Oral BID BM  . ferrous sulfate  325 mg Oral Q breakfast  . FLUoxetine  20 mg Oral BID  . gabapentin  100 mg Oral QHS  . insulin aspart  0-15 Units Subcutaneous TID WC  . simvastatin  20 mg Oral QHS  . sodium chloride flush  3 mL Intravenous Q12H   sodium chloride, albuterol, ALPRAZolam, bisacodyl, ipratropium-albuterol, ondansetron **OR** ondansetron (ZOFRAN) IV, oxyCODONE-acetaminophen, senna, sodium chloride flush  Assessment/ Plan:  Ms. BELEM HINTZE is a 64 y.o.  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 Chronic kidney disease stage IV   Renal function stays stable Lab Results  Component Value Date   CREATININE 2.42 (H) 06/05/2020   CREATININE 2.42 (H) 06/04/2020   CREATININE 2.73 (H) 06/03/2020  We will give one dose of Bumex IV today  2. Hypertension Blood pressure readings above goal On Amlodipine,Atenolol, Clonidine   3.  Anemia of CKD and chemotherapy induced  Patient's hemoglobin improved to 9.6, post PRBC transfusion  4. Bilateral pleural effusions  On 06/04/2020,patient underwent diagnostic and therapeutic Lt.thoracentesis with 500 ml of fluid removal. Planning for Rt thoracentesis today            LOS: 3 Terel Bann 11/2/20211:06 PM

## 2020-06-06 DIAGNOSIS — D649 Anemia, unspecified: Secondary | ICD-10-CM

## 2020-06-06 DIAGNOSIS — N184 Chronic kidney disease, stage 4 (severe): Secondary | ICD-10-CM

## 2020-06-06 DIAGNOSIS — C7951 Secondary malignant neoplasm of bone: Secondary | ICD-10-CM

## 2020-06-06 DIAGNOSIS — C782 Secondary malignant neoplasm of pleura: Secondary | ICD-10-CM

## 2020-06-06 DIAGNOSIS — C50912 Malignant neoplasm of unspecified site of left female breast: Secondary | ICD-10-CM

## 2020-06-06 DIAGNOSIS — D72829 Elevated white blood cell count, unspecified: Secondary | ICD-10-CM

## 2020-06-06 LAB — GRAM STAIN

## 2020-06-06 LAB — GLUCOSE, CAPILLARY
Glucose-Capillary: 218 mg/dL — ABNORMAL HIGH (ref 70–99)
Glucose-Capillary: 306 mg/dL — ABNORMAL HIGH (ref 70–99)

## 2020-06-06 LAB — CBC WITH DIFFERENTIAL/PLATELET
Abs Immature Granulocytes: 0.7 10*3/uL — ABNORMAL HIGH (ref 0.00–0.07)
Basophils Absolute: 0.1 10*3/uL (ref 0.0–0.1)
Basophils Relative: 1 %
Eosinophils Absolute: 0 10*3/uL (ref 0.0–0.5)
Eosinophils Relative: 0 %
HCT: 29.4 % — ABNORMAL LOW (ref 36.0–46.0)
Hemoglobin: 9.7 g/dL — ABNORMAL LOW (ref 12.0–15.0)
Immature Granulocytes: 5 %
Lymphocytes Relative: 4 %
Lymphs Abs: 0.6 10*3/uL — ABNORMAL LOW (ref 0.7–4.0)
MCH: 29.5 pg (ref 26.0–34.0)
MCHC: 33 g/dL (ref 30.0–36.0)
MCV: 89.4 fL (ref 80.0–100.0)
Monocytes Absolute: 2.2 10*3/uL — ABNORMAL HIGH (ref 0.1–1.0)
Monocytes Relative: 16 %
Neutro Abs: 9.9 10*3/uL — ABNORMAL HIGH (ref 1.7–7.7)
Neutrophils Relative %: 74 %
Platelets: 397 10*3/uL (ref 150–400)
RBC: 3.29 MIL/uL — ABNORMAL LOW (ref 3.87–5.11)
RDW: 16.5 % — ABNORMAL HIGH (ref 11.5–15.5)
WBC: 13.4 10*3/uL — ABNORMAL HIGH (ref 4.0–10.5)
nRBC: 0.7 % — ABNORMAL HIGH (ref 0.0–0.2)

## 2020-06-06 LAB — BASIC METABOLIC PANEL
Anion gap: 10 (ref 5–15)
BUN: 43 mg/dL — ABNORMAL HIGH (ref 8–23)
CO2: 27 mmol/L (ref 22–32)
Calcium: 8.8 mg/dL — ABNORMAL LOW (ref 8.9–10.3)
Chloride: 102 mmol/L (ref 98–111)
Creatinine, Ser: 2.27 mg/dL — ABNORMAL HIGH (ref 0.44–1.00)
GFR, Estimated: 24 mL/min — ABNORMAL LOW (ref >60)
Glucose, Bld: 223 mg/dL — ABNORMAL HIGH (ref 70–99)
Potassium: 4.8 mmol/L (ref 3.5–5.1)
Sodium: 139 mmol/L (ref 135–145)

## 2020-06-06 LAB — CYTOLOGY - NON PAP

## 2020-06-06 MED ORDER — HEPARIN SOD (PORK) LOCK FLUSH 100 UNIT/ML IV SOLN
500.0000 [IU] | INTRAVENOUS | Status: AC
  Administered 2020-06-06: 500 [IU] via INTRAVENOUS
  Filled 2020-06-06: qty 5

## 2020-06-06 MED ORDER — BUMETANIDE 1 MG PO TABS: 1.0000 mg | ORAL_TABLET | Freq: Two times a day (BID) | ORAL | 0 refills | Status: DC

## 2020-06-06 MED ORDER — AMOXICILLIN-POT CLAVULANATE 250-125 MG PO TABS: 1.0000 | ORAL_TABLET | Freq: Two times a day (BID) | ORAL | 0 refills | Status: AC

## 2020-06-06 NOTE — TOC Progression Note (Addendum)
Transition of Care [facility]) - Progression Note    Patient Details  Name: [name] MRN: [id number] Date of Birth: 14-Jan-1956  Transition of Care [facility]) CM/SW [place], LCSW Phone Number: 06/06/2020, 11:55 AM  Clinical Narrative: Patient agreeable to home health RN. She does not want PT. First preference is [facility] but their nurse cannot see her until the weekend. Second preference is [facility]. Left message for representative to see if they can take her back. They just discharged her last week. RN will complete qualifying saturations test for home oxygen.  12:24 pm: Ordered oxygen through [employer]. [facility] representative checking with office to see if they can take her back.  Expected Discharge Plan: [place] Barriers to Discharge: Continued Medical Work up  Expected Discharge Plan and Services Expected Discharge Plan: [place] arrangements for the past 2 months: Single Family Home                                       Social Determinants of Health (SDOH) Interventions    Readmission Risk Interventions Readmission Risk Prevention Plan 06/04/2020 04/01/2020  Transportation Screening Complete Complete  Medication Review Press photographer) Complete Complete  PCP or Specialist appointment within 3-5 days of discharge Complete Complete  HRI or Home Care Consult Complete Complete  SW Recovery Care/Counseling Consult Complete Complete  Palliative Care Screening Not Applicable Not [place] Not Applicable Not Applicable  Some recent data might be hidden

## 2020-06-06 NOTE — Progress Notes (Signed)
SATURATION QUALIFICATIONS: (This note is used to comply with regulatory documentation for home oxygen)  Patient Saturations on Room Air at Rest = 91%  Patient Saturations on Room Air while Ambulating = 88%  Patient Saturations on 2 Liters of oxygen while Ambulating = 95%  Please briefly explain why patient needs home oxygen: patients oxygen levels drop when she is not wearing oxygen and even worse with exertion

## 2020-06-06 NOTE — Progress Notes (Signed)
Mobility Specialist - Progress Note   06/06/20 1200  Mobility  Range of Motion/Exercises Right leg;Left leg (straight leg raises, heel slides, hip isometrics)  Level of Assistance Standby assist, set-up cues, supervision of patient - no hands on  Assistive Device None  Distance Ambulated (ft) 0 ft  Mobility Response Tolerated well  Mobility performed by Mobility specialist  $Mobility charge 1 Mobility    Pre-mobility: 77 HR, 96% SpO2 Post-mobility: 78 HR, 94% SpO2   Pt was sitting in recliner upon arrival utilizing 2L [place] O2. Pt agreed to session. Pt denied any pain, nausea, or fatigue. Pt declined OOB activity this date stating, "I can't get up right now cause I can't put any weight on my L foot." Per chart review, pt ambulated earlier this date with nursing staff. Pt was able to perform exercises: heel slides, hip isometrics, and straight leg raises with SBA. Pt presented difficulty with seated march d/t limitations in R knee. Overall, pt tolerated session well. Pt was left in recliner with all needs in reach.    Kathee Delton Mobility Specialist 06/06/20, 12:54 PM

## 2020-06-06 NOTE — Progress Notes (Signed)
Order received from Dr Priscella Mann to discontinue telemetry

## 2020-06-06 NOTE — Discharge Summary (Signed)
Physician Discharge Summary  [name] QVZ:[id number] DOB: [birth year] DOA: 06/02/2020  PCP: [name], MD  Admit date: 06/02/2020 Discharge date: 06/06/2020  Admitted From: Home Disposition: Home with home health services  Recommendations for Outpatient Follow-up:  1. Follow up with PCP in 1-2 weeks 2. Follow-up with oncology as directed 3. Follow-up with podiatry as directed  Home Health: Yes Equipment/Devices: Yes oxygen 2 L Discharge Condition: Stable CODE STATUS: Full Diet recommendation: Heart Healthy / Carb Modified Brief/Interim Summary: Gloria A Mebaneis a 64 y.o.femalewith medical history significant forstage IV breast cancer on chemotherapy, stage IV chronic kidney disease, depression, hypertension, GERD who presents to the emergency room for evaluation of shortness of breath mostly with exertion which she has had for about a week.  CT scan of the chest without contrast shows bilateral malignant pleural effusions which are only slightly larger in comparison PET/CT with associated evidence of visceral, parietal pleural metastasis. Redemonstration of skeletal mets with new/enlarging lytic lesions of the thoracic spine compatible with progression. The left breast mass appears to be enlarged compared to the prior PET.  11/1-s/p left diagnostic and therapeutic thoracentesis, yielded 500 mL of blood-tinged fluid 11/2-right thoracentesis, 550 mils fluid removed 11/3-patient stable.  Cleared for discharge per podiatry and nephrology.  Given leukocytosis of unclear etiology will plan on discharge on empiric course of Augmentin.  Dose adjusted for renal function.  Follow-up with oncology, nephrology, podiatry on discharge  Discharge Diagnoses:  Principal Problem:   Malignant pleural effusion Active Problems:   Benign essential HTN   Anxiety and depression   Type 2 diabetes mellitus with diabetic chronic kidney disease ([place])   Carcinoma of upper-inner quadrant of  left breast in female, estrogen receptor positive ([place])   Metastasis from malignant tumor of breast ([place])   Antineoplastic chemotherapy induced anemia   Diabetic foot ulcer ([place])   Leukocytosis   Palliative care encounter   Malignant pleural effusion, Bilateral Secondary to known metastatic breast cancer -Patient presents for evaluation of exertional shortness of breath and at rest has room air pulse oximetry of 92% which dropped to 89% with conversation  s/p Lt thoracentesis on 11/1.  S/p rt thoracentesis 11/2 Total approximately 1 L of blood-tinged fluid removed Suspected malignant effusion Remains on 2L post thoracentesis Meets qualifications for home oxygen Discharge on 2 L home oxygen Close follow-up with oncology post discharge  Metastatic breast cancer Patient with known stage IV breast cancer -CT scan of the chest without contrast showsbilateral malignant pleural effusions, which are only slightlylarger than the comparison PET CT of 03/28/2020, with associated evidence of visceral/parietal pleural metastases. -Re demonstrationofskeletal metastases, with new/enlarging lytic lesions of the thoracic spine compatible with progression.The left breast mass appears to be enlarging compared to the priorPET. 11/2-Per oncology there will be a family meeting this afternoon since patient is in a difficult situation with progressive malignancy, poor tolerance to chemotherapy, multiple comorbidities including chronic kidney disease, severe anemia, infection and etc. despite strong recommendations for DNR/DNI and meeting with oncology palliative care, patient and family elect to continue with full scope of aggressive treatment including full CODE STATUS at this time.  Discussed with oncology on day of discharge.  They will revisit this issues with outpatient   Diabetes mellitus with complications of stage IV chronic kidney disease Can resume home regimen on discharge    anemia of  chronic disease multifactorial:antineoplastic/poor PO intake/cancer -Hemoglobin on admission is 7.9g/dl--6.4--1 unit BT on 10/31 11/1-oncology via chat recommended 1 unit PRBC today 11/2-hemoglobin stable  at 9.6 Stable time of discharge   Hypertension -Overall stable, continue Bumex, clonidine, atenolol  -Home Bumex dose increased to 1 mg twice daily    Depression and anxiety -Continue alprazolam and fluoxetine    History of diastolic dysfunction CHF Continue atenolol,  Bumex  Leukocytosis -Unclear etiology improved 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 Wound cx sent off. Redressed wound.  Continue empiric abx, per podiatry at this time does not see any obvious signs of infection to the left foot based on clinical and radiographic examination .  If other potential concerns eyes and no other sources of infection are noted then could potentially obtain an MRI of the left foot but do not believe it warrants it at this time based on the clinical and radiographic picture so far. Pt should be using Prevalon boots.  When in bed to avoid pressure type ulcerations.  Orders were placed by podiatry. Culture with multiple organisms Afebrile at time of discharge.  Considering immunocompromise state and uptrending white count we will treat empirically with 1 week of Augmentin with dose adjusted for renal function   CKD stage IV-  Patient follows with nephrology as outpatient.  Recommend increasing Bumex dose to 1 mg twice daily.  Outpatient follow-up with nephrology within 2 weeks.   Discharge Instructions  Discharge Instructions    Care order/instruction   Complete by: As directed    Transfuse Parameters   Diet - low sodium heart healthy   Complete by: As directed    Increase activity slowly   Complete  by: As directed    No wound care   Complete by: As directed    Type and screen   Complete by: As directed    [place]      Allergies as of 06/06/2020      Reactions   Fish-derived Products Anaphylaxis   Sulfamethoxazole-trimethoprim Other (See Comments)   Caused kidney issues   Chlorhexidine       Medication List    TAKE these medications   acetaminophen-codeine 300-60 MG tablet Commonly known as: TYLENOL #4 Take 1 tablet by mouth in the morning and at bedtime.   ALPRAZolam 0.5 MG tablet Commonly known as: XANAX Take 0.5 mg by mouth 2 (two) times daily as needed for anxiety or sleep.   amLODipine 10 MG tablet Commonly known as: NORVASC Take 10 mg by mouth daily.   amoxicillin-clavulanate 250-125 MG tablet Commonly known as: AUGMENTIN Take 1 tablet by mouth 2 (two) times daily for 7 days.   aspirin EC 81 MG tablet Take 81 mg by mouth daily.   atenolol 50 MG tablet Commonly known as: TENORMIN Take 50 mg by mouth 2 (two) times daily.   bumetanide 1 MG tablet Commonly known as: BUMEX Take 1 tablet (1 mg total) by mouth 2 (two) times daily. What changed:   medication strength  how much to take   calcitRIOL 0.25 MCG capsule Commonly known as: ROCALTROL Take 0.25 mcg by mouth daily.   Cinnamon 500 MG capsule Take 500 mg by mouth 2 (two) times daily.   cloNIDine 0.2 MG tablet Commonly known as: CATAPRES Take 0.2 mg by mouth 2 (two) times daily.   docusate sodium 100 MG capsule Commonly known as: COLACE Take 1 capsule (100 mg total) by mouth 2 (two) times daily. What changed: when to take  this   enalapril 10 MG tablet Commonly known as: VASOTEC Take 20 mg by mouth 2 (two) times a day.   famotidine 20 MG tablet Commonly known as: PEPCID Take 20 mg by mouth 2 (two) times daily.   ferrous sulfate 325 (65 FE) MG tablet Take 325 mg by mouth daily with breakfast.   Flovent Diskus 100 MCG/BLIST Aepb Generic drug: Fluticasone  Propionate (Inhal) Inhale 2 puffs into the lungs in the morning and at bedtime.   FLUoxetine 20 MG capsule Commonly known as: PROZAC Take 20 mg by mouth 2 (two) times daily.   gabapentin 100 MG capsule Commonly known as: NEURONTIN TAKE 1 CAPSULE(100 MG) BY MOUTH AT BEDTIME What changed:   how much to take  how to take this  when to take this  additional instructions   glyBURIDE 5 MG tablet Commonly known as: DIABETA Take 10 mg by mouth 2 (two) times daily with a meal.   Levemir FlexTouch 100 UNIT/ML FlexPen Generic drug: insulin detemir Inject 25 Units into the skin daily.   NovoLOG FlexPen 100 UNIT/ML FlexPen Generic drug: insulin aspart Inject 5 Units into the skin 3 (three) times daily with meals. What changed:   how much to take  when to take this   oxyCODONE-acetaminophen 5-325 MG tablet Commonly known as: PERCOCET/ROXICET Take by mouth.   ProAir HFA 108 (90 Base) MCG/ACT inhaler Generic drug: albuterol Inhale 2 puffs into the lungs every 6 (six) hours as needed for wheezing or shortness of breath.   senna 8.6 MG Tabs tablet Commonly known as: SENOKOT Take 1 tablet (8.6 mg total) by mouth 2 (two) times daily as needed for mild constipation or moderate constipation.   simvastatin 20 MG tablet Commonly known as: ZOCOR Take 20 mg by mouth every evening.            Durable Medical Equipment  (From admission, onward)         Start     Ordered   06/06/20 1205  For home use only DME oxygen  Once       Question Answer Comment  Length of Need 6 Months   Mode or (Route) Nasal cannula   Liters per Minute 2   Frequency Continuous (stationary and portable oxygen unit needed)   Oxygen conserving device Yes   Oxygen delivery system Gas      06/06/20 1204          Follow-up Information    Samara Deist, DPM. Go on 06/19/2020.   Specialty: Podiatry Why: 11:15 Contact information: [place] [address] [place]             . Schedule an appointment as soon as possible for a visit in 1 week.   Contact information: [address] [place] [phone]             Allergies  Allergen Reactions  . Fish-Derived Products Anaphylaxis  . Sulfamethoxazole-Trimethoprim Other (See Comments)    Caused kidney issues  . Chlorhexidine     Consultations:  Nephrology  Podiatry  Palliative care  Oncology   Procedures/Studies: DG Chest 2 View  Result Date: 06/02/2020 CLINICAL DATA:  Shortness of breath EXAM: CHEST - 2 VIEW COMPARISON:  March 29, 2020 FINDINGS: Stable right Port-A-Cath. No pneumothorax. Bilateral pleural effusions with underlying opacities are similar in the interval. Mild interstitial prominence. No other acute  abnormalities or changes. IMPRESSION: 1. Stable bilateral pleural effusions with underlying opacities. 2. Possible mild pulmonary edema/pulmonary venous congestion. Electronically Signed   By: Dorise Bullion III M.D   On: 06/02/2020 11:29   CT Chest Wo Contrast  Result Date: 06/02/2020 CLINICAL DATA:  64 year old female with a history of shortness of breath, metastatic breast cancer EXAM: CT CHEST WITHOUT CONTRAST TECHNIQUE: Multidetector CT imaging of the chest was performed following the standard protocol without IV contrast. COMPARISON:  PET-CT 03/28/2020 FINDINGS: Cardiovascular: Heart size is unchanged. No pericardial fluid. Normal course caliber and contour of the thoracic aorta. No significant atherosclerotic changes. Right IJ port catheter. Mediastinum/Nodes: Unremarkable thyroid/thoracic inlet. Unremarkable thoracic esophagus. The subcarinal node node measures smaller than the comparison PET CT, previously approximately 10 mm, now 7 mm. Otherwise, no enlarged mediastinal lymph nodes. Lungs/Pleura: Bilateral pleural effusions, predominantly in the dependent aspects of the lungs.  There is rim of hyperdensity adjacent to the low-density fluid at the costophrenic sulci and extending to the medial, posterior, and lateral pleural/fluid interface of the bilateral lungs. The fluid extends along the lateral lung to the anterior lung on the left, with small volume of partially trapped fluid. There is hyperdense tissue thickening along the pleural mediastinal interface of the upper mediastinum, more pronounced on the left. The appearance of the nodular thickening is relatively similar to the PET CT dated 03/28/2020. Overall, the volume of fluid appears to have slightly progressed. Nodular lung changes at the periphery of the left upper lobe on image 45 of series 3, left lower lobe on image 86 of series 2, and at the lower right lung, image 75 of series 2 and image 92 of series 2. No evidence of interlobular septal thickening. There is no confluent airspace disease. No endotracheal or endobronchial debris. Upper Abdomen: No acute finding of the upper abdomen. Musculoskeletal: Redemonstration of spiculated soft tissue nodule of the left breast, compatible with the given history of breast cancer. The more inferior aspect of the mass measures slightly larger in AP dimension on the axial CT, previously 27 mm, now 30 mm. Although sclerotic foci within the skeleton are relatively unchanged, there are new/enlarging lytic lesions of the thoracic spine including: T5 on image 48 of series 2, with new lytic lesion adjacent to the sclerotic focus T6 at the anterior vertebral body, enlarging from the prior T7 at the right vertebral body, mixed lytic and sclerotic, enlarging from the prior T10 at the anterior vertebral body, enlarging T11, posterior vertebral body, enlarging IMPRESSION: Bilateral malignant pleural effusions, which are only slightly larger than the comparison PET CT of 03/28/2020, with associated evidence of visceral/parietal pleural metastases. Redemonstration skeletal metastases, with  new/enlarging lytic lesions of the thoracic spine compatible with progression. The left breast mass appears to be enlarging compared to the prior PET. Electronically Signed   By: Corrie Mckusick D.O.   On: 06/02/2020 15:35   DG Chest Port 1 View  Result Date: 06/05/2020 CLINICAL DATA:  Status post right-sided thoracentesis. EXAM: PORTABLE CHEST 1 VIEW COMPARISON:  One-view chest x-ray 08/05/2019 FINDINGS: Heart is enlarged. Right IJ Port-A-Cath is stable. Right pleural effusion has decreased. No pneumothorax is present. Left pleural effusion persists. Bilateral associated airspace disease is present. Mild edema is present. IMPRESSION: 1. Decrease in right pleural effusion without pneumothorax. 2. Persistent bilateral pleural effusions and bibasilar airspace disease. Electronically Signed   By: San Morelle M.D.   On: 06/05/2020 12:07   DG Chest Port 1 View  Result Date: 06/04/2020 CLINICAL  DATA:  Status post thoracentesis from the left. EXAM: PORTABLE CHEST 1 VIEW COMPARISON:  Chest radiograph 06/02/2020. FINDINGS: Right anterior chest wall Port-A-Cath is present with tip projecting over the superior vena cava. Monitoring leads overlie the patient. Stable enlarged cardiac and mediastinal contours. Persistent moderate bilateral pleural effusions and underlying consolidation. No definite pneumothorax. Thoracic spine degenerative changes. IMPRESSION: Persistent moderate bilateral pleural effusions and underlying consolidation. Electronically Signed   By: Lovey Newcomer M.D.   On: 06/04/2020 10:23   DG Foot Complete Left  Result Date: 06/02/2020 CLINICAL DATA:  Osteomyelitis of the left foot. EXAM: LEFT FOOT - COMPLETE 3+ VIEW COMPARISON:  April 03, 2020 FINDINGS: There is no evidence of fracture or dislocation. There is no evidence of cortical destructive lesions. Stable postsurgical changes from transmetatarsal amputation of the fifth ray. Soft tissue swelling of the midfoot. IMPRESSION: 1. No  radiographic evidence of osteomyelitis. 2. Soft tissue swelling of the midfoot. 3. Stable postsurgical changes from transmetatarsal amputation of the fifth ray. Electronically Signed   By: Fidela Salisbury M.D.   On: 06/02/2020 16:51   US THORACENTESIS ASP PLEURAL SPACE W/IMG GUIDE  Result Date: 06/05/2020 INDICATION: Shortness of breath. History of breast cancer. Right-sided pleural effusion. Request for diagnostic and therapeutic thoracentesis. EXAM: ULTRASOUND GUIDED RIGHT THORACENTESIS MEDICATIONS: 1% plain lidocaine, 5 mL COMPLICATIONS: None immediate. Postprocedural chest x-ray negative for pneumothorax PROCEDURE: An ultrasound guided thoracentesis was thoroughly discussed with the patient and questions answered. The benefits, risks, alternatives and complications were also discussed. The patient understands and wishes to proceed with the procedure. Written consent was obtained. Ultrasound was performed to localize and mark an adequate pocket of fluid in the right chest. The area was then prepped and draped in the normal sterile fashion. 1% Lidocaine was used for local anesthesia. Under ultrasound guidance a 6 Fr Safe-T-Centesis catheter was introduced. Thoracentesis was performed. The catheter was removed and a dressing applied. FINDINGS: A total of approximately 550 mL of blood-tinged pleural fluid was removed. Samples were sent to the laboratory as requested by the clinical team. IMPRESSION: Successful ultrasound guided right thoracentesis yielding 550 mL of pleural fluid. Read by: Ascencion Dike PA-C Electronically Signed   By: Markus Daft M.D.   On: 06/05/2020 12:16   US THORACENTESIS ASP PLEURAL SPACE W/IMG GUIDE  Result Date: 06/04/2020 INDICATION: Patient with metastatic breast cancer, now with bilateral pleural effusions. Request made for diagnostic and therapeutic thoracentesis EXAM: ULTRASOUND GUIDED DIAGNOSTIC AND THERAPEUTIC LEFT THORACENTESIS MEDICATIONS: 10 mL 1% lidocaine  COMPLICATIONS: None immediate. PROCEDURE: An ultrasound guided thoracentesis was thoroughly discussed with the patient and questions answered. The benefits, risks, alternatives and complications were also discussed. The patient understands and wishes to proceed with the procedure. Written consent was obtained. Ultrasound was performed to localize and mark an adequate pocket of fluid in the left chest. The area was then prepped and draped in the normal sterile fashion. 1% Lidocaine was used for local anesthesia. Under ultrasound guidance a 6 Fr Safe-T-Centesis catheter was introduced. Thoracentesis was performed. The catheter was removed and a dressing applied. FINDINGS: A total of approximately 500 mL of blood-tinged fluid was removed. Samples were sent to the laboratory as requested by the clinical team. Procedure stopped prior to removal of all fluid due to patient's complaint of discomfort. IMPRESSION: Successful ultrasound guided left thoracentesis yielding 500 mL of pleural fluid. Read by: Brynda Greathouse PA-C Electronically Signed   By: Markus Daft M.D.   On: 06/04/2020 11:11    (Echo, Carotid,  EGD, Colonoscopy, ERCP)    Subjective: Seen and examined the day of discharge.  No complaints, feels well.  Stable for discharge home.  Discharge Exam: Vitals:   06/06/20 0856 06/06/20 1120  BP: 125/72 119/79  Pulse: 81 77  Resp: 16 16  Temp: (!) 97.4 F (36.3 C) 97.8 F (36.6 C)  SpO2: 94% 100%   Vitals:   06/05/20 2251 06/06/20 0337 06/06/20 0856 06/06/20 1120  BP: 119/72 129/77 125/72 119/79  Pulse: 80 79 81 77  Resp: 16 16 16 16   Temp: 98 F (36.7 C) 98 F (36.7 C) (!) 97.4 F (36.3 C) 97.8 F (36.6 C)  TempSrc: Oral Oral Oral   SpO2: 100% 100% 94% 100%  Weight:      Height:        General: Pt is alert, awake, not in acute distress Cardiovascular: RRR, S1/S2 +, no rubs, no gallops Respiratory: CTA bilaterally, no wheezing, no rhonchi Abdominal: Soft, NT, ND, bowel sounds  + Extremities: Left lower extremity in surgical wraps    The results of significant diagnostics from this hospitalization (including imaging, microbiology, ancillary and laboratory) are listed below for reference.     Microbiology: Recent Results (from the past 240 hour(s))  Respiratory Panel by RT PCR (Flu A&B, Covid) - Nasopharyngeal Swab     Status: None   Collection Time: 06/02/20  2:08 PM   Specimen: Nasopharyngeal Swab  Result Value Ref Range Status   SARS Coronavirus 2 by RT PCR NEGATIVE NEGATIVE Final    Comment: (NOTE) SARS-CoV-2 target nucleic acids are NOT DETECTED.  The SARS-CoV-2 RNA is generally detectable in upper respiratoy specimens during the acute phase of infection. The lowest concentration of SARS-CoV-2 viral copies this assay can detect is 131 copies/mL. A negative result does not preclude SARS-Cov-2 infection and should not be used as the sole basis for treatment or other patient management decisions. A negative result may occur with  improper specimen collection/handling, submission of specimen other than nasopharyngeal swab, presence of viral mutation(s) within the areas targeted by this assay, and inadequate number of viral copies (<131 copies/mL). A negative result must be combined with clinical observations, patient history, and epidemiological information. The expected result is Negative.  Fact Sheet for Patients:  PinkCheek.be  Fact Sheet for Healthcare Providers:  GravelBags.it  This test is no t yet approved or cleared by the [place] FDA and  has been authorized for detection and/or diagnosis of SARS-CoV-2 by FDA under an Emergency Use Authorization (EUA). This EUA will remain  in effect (meaning this test can be used) for the duration of the COVID-19 declaration under Section 564(b)(1) of the Act, 21 U.S.C. section 360bbb-3(b)(1), unless the authorization is terminated or revoked  sooner.     Influenza A by PCR NEGATIVE NEGATIVE Final   Influenza B by PCR NEGATIVE NEGATIVE Final    Comment: (NOTE) The Xpert Xpress SARS-CoV-2/FLU/RSV assay is intended as an aid in  the diagnosis of influenza from Nasopharyngeal swab specimens and  should not be used as a sole basis for treatment. Nasal washings and  aspirates are unacceptable for Xpert Xpress SARS-CoV-2/FLU/RSV  testing.  Fact Sheet for Patients: PinkCheek.be  Fact Sheet for Healthcare Providers: GravelBags.it  This test is not yet approved or cleared by the [place] FDA and  has been authorized for detection and/or diagnosis of SARS-CoV-2 by  FDA under an Emergency Use Authorization (EUA). This EUA will remain  in effect (meaning this test can be used) for  the duration of the  Covid-19 declaration under Section 564(b)(1) of the Act, 21  U.S.C. section 360bbb-3(b)(1), unless the authorization is  terminated or revoked. Performed at [facility], [address] [place], [place] [address]   Culture, blood (Routine X 2) w Reflex to ID Panel     Status: None (Preliminary result)   Collection Time: 06/02/20  4:32 PM   Specimen: BLOOD  Result Value Ref Range Status   Specimen Description BLOOD RIGHT PORT  Final   Special Requests   Final    BOTTLES DRAWN AEROBIC AND ANAEROBIC Blood Culture adequate volume   Culture   Final    NO GROWTH 4 DAYS Performed at [facility], [address] [place], [place] [address]    Report Status PENDING  Incomplete  Culture, blood (Routine X 2) w Reflex to ID Panel     Status: None (Preliminary result)   Collection Time: 06/02/20  6:00 PM   Specimen: BLOOD  Result Value Ref Range Status   Specimen Description BLOOD RIGHT PORT  Final   Special Requests   Final    BOTTLES DRAWN AEROBIC AND ANAEROBIC Blood Culture adequate volume   Culture   Final    NO GROWTH 4 DAYS Performed at [facility], [address], [place], [place] [address]    Report Status PENDING  Incomplete  Urine Culture     Status: Abnormal   Collection Time: 06/02/20  6:00 PM   Specimen: Urine, Random  Result Value Ref Range Status   Specimen Description   Final    URINE, RANDOM Performed at [facility], [address] [place], [place] [address]    Special Requests   Final    NONE Performed at [facility], [address], [place], [place] [address]    Culture MULTIPLE SPECIES PRESENT, SUGGEST RECOLLECTION (A)  Final   Report Status 06/04/2020 FINAL  Final  Aerobic/Anaerobic Culture (surgical/deep wound)     Status: None (Preliminary result)   Collection Time: 06/03/20 11:14 AM   Specimen: Wound  Result Value Ref Range Status   Specimen Description   Final    WOUND Performed at [facility], [address] [place], [place] [address]    Special Requests   Final    NONE Performed at [facility], [address] [place],  [address]    Gram Stain   Final    RARE WBC PRESENT, PREDOMINANTLY PMN FEW GRAM POSITIVE COCCI IN PAIRS IN CLUSTERS Performed at [facility], [place] [address] [place],  [address]    Culture   Final    RARE METHICILLIN RESISTANT STAPHYLOCOCCUS AUREUS FEW STAPHYLOCOCCUS EPIDERMIDIS SUSCEPTIBILITIES TO FOLLOW NO ANAEROBES ISOLATED; CULTURE IN PROGRESS FOR 5 DAYS    Report Status PENDING  Incomplete   Organism ID, Bacteria METHICILLIN RESISTANT STAPHYLOCOCCUS AUREUS  Final      Susceptibility   Methicillin resistant staphylococcus aureus - MIC*    CIPROFLOXACIN >=8 RESISTANT Resistant     ERYTHROMYCIN >=8 RESISTANT Resistant     GENTAMICIN <=0.5 SENSITIVE Sensitive     OXACILLIN >=4 RESISTANT Resistant     TETRACYCLINE <=1 SENSITIVE Sensitive     VANCOMYCIN <=0.5 SENSITIVE Sensitive     TRIMETH/SULFA <=10 SENSITIVE Sensitive     CLINDAMYCIN <=0.25 SENSITIVE Sensitive     RIFAMPIN <=0.5 SENSITIVE  Sensitive     Inducible Clindamycin NEGATIVE Sensitive     * RARE METHICILLIN RESISTANT STAPHYLOCOCCUS AUREUS  Body fluid culture     Status: None (Preliminary result)   Collection Time: 06/04/20  9:45 AM   Specimen: PATH Cytology Pleural fluid  Result Value Ref Range Status   Specimen Description   Final    PLEURAL Performed at [facility], [address], [place], [place] [address]    Special Requests   Final    NONE Performed at [facility], [place], [place] [address]    Gram Stain NO WBC SEEN NO ORGANISMS SEEN   Final   Culture   Final    NO GROWTH 2 DAYS Performed at [facility], [place] [address], [place], [place] [address]    Report Status PENDING  Incomplete  Gram stain     Status: None   Collection Time: 06/05/20 12:04 PM   Specimen: PATH Cytology Pleural fluid  Result Value Ref Range Status   Specimen Description   Final    PLEURAL Performed at [facility], [address] [place], [place] [address]    Special Requests   Final    NONE Performed at [facility], [address] [place], [place] [address]    Gram Stain   Final    RARE WBC PRESENT,BOTH PMN AND MONONUCLEAR NO ORGANISMS SEEN Performed at [facility], [place] [address], [place],  [address]    Report Status 06/06/2020 FINAL  Final     Labs: BNP (last 3 results) Recent Labs    09/27/19 1406 06/02/20 1048  BNP 60.0 433.2*   Basic Metabolic Panel: Recent Labs  Lab 06/02/20 1048 06/03/20 0504 06/04/20 0446 06/05/20 0515 06/06/20 0440  NA 136 137  --  138 139  K 4.5 4.2  --  4.7 4.8  CL 98 101  --  102 102  CO2 25 27  --  27 27  GLUCOSE 267* 80  --  108* 223*  BUN 50* 46*  --  42* 43*  CREATININE 3.17* 2.73* 2.42* 2.42* 2.27*  CALCIUM 8.5* 8.3*  --  8.7* 8.8*   Liver Function Tests: No results for input(s): AST, ALT, ALKPHOS, BILITOT, PROT, ALBUMIN in the last 168 hours. No results for input(s): LIPASE,  AMYLASE in the last 168 hours. No results for input(s): AMMONIA in the last 168 hours. CBC: Recent Labs  Lab 06/02/20 1048 06/02/20 1048 06/03/20 0504 06/04/20 0446 06/04/20 1929 06/05/20 0515 06/06/20 0440  WBC 20.5*  --  15.1* 6.6  --  10.1 13.4*  NEUTROABS  --   --   --  4.6  --  7.2 9.9*  HGB 7.9*   < > 6.4* 7.6* 9.2* 9.6* 9.7*  HCT 24.4*   < > 19.5* 23.3* 29.7* 29.4* 29.4*  MCV 90.0  --  89.9 90.0  --  88.6 89.4  PLT 596*  --  445* 417*  --  418* 397   < > = values in this interval not displayed.   Cardiac Enzymes: No results for input(s): CKTOTAL, CKMB, CKMBINDEX, TROPONINI in the last 168 hours. BNP: Invalid input(s): POCBNP CBG: Recent Labs  Lab 06/05/20 0753 06/05/20 1604 06/05/20 2116 06/06/20 0757 06/06/20 1124  GLUCAP 135* 162* 196* 218* 306*   D-Dimer No results for input(s): DDIMER in the last 72 hours. Hgb A1c No results for input(s): HGBA1C in the last 72 hours. Lipid Profile No results for input(s): CHOL, HDL, LDLCALC, TRIG, CHOLHDL, LDLDIRECT in the last 72 hours. Thyroid function studies No results for input(s):  TSH, T4TOTAL, T3FREE, THYROIDAB in the last 72 hours.  Invalid input(s): FREET3 Anemia work up No results for input(s): VITAMINB12, FOLATE, FERRITIN, TIBC, IRON, RETICCTPCT in the last 72 hours. Urinalysis    Component Value Date/Time   COLORURINE YELLOW (A) 06/02/2020 1800   APPEARANCEUR HAZY (A) 06/02/2020 1800   LABSPEC 1.010 06/02/2020 1800   PHURINE 6.0 06/02/2020 1800   GLUCOSEU 50 (A) 06/02/2020 1800   HGBUR NEGATIVE 06/02/2020 1800   BILIRUBINUR NEGATIVE 06/02/2020 1800   KETONESUR NEGATIVE 06/02/2020 1800   PROTEINUR 100 (A) 06/02/2020 1800   NITRITE NEGATIVE 06/02/2020 1800   LEUKOCYTESUR NEGATIVE 06/02/2020 1800   Sepsis Labs Invalid input(s): PROCALCITONIN,  WBC,  LACTICIDVEN Microbiology Recent Results (from the past 240 hour(s))  Respiratory Panel by RT PCR (Flu A&B, Covid) - Nasopharyngeal Swab     Status: None    Collection Time: 06/02/20  2:08 PM   Specimen: Nasopharyngeal Swab  Result Value Ref Range Status   SARS Coronavirus 2 by RT PCR NEGATIVE NEGATIVE Final    Comment: (NOTE) SARS-CoV-2 target nucleic acids are NOT DETECTED.  The SARS-CoV-2 RNA is generally detectable in upper respiratoy specimens during the acute phase of infection. The lowest concentration of SARS-CoV-2 viral copies this assay can detect is 131 copies/mL. A negative result does not preclude SARS-Cov-2 infection and should not be used as the sole basis for treatment or other patient management decisions. A negative result may occur with  improper specimen collection/handling, submission of specimen other than nasopharyngeal swab, presence of viral mutation(s) within the areas targeted by this assay, and inadequate number of viral copies (<131 copies/mL). A negative result must be combined with clinical observations, patient history, and epidemiological information. The expected result is Negative.  Fact Sheet for Patients:  PinkCheek.be  Fact Sheet for Healthcare Providers:  GravelBags.it  This test is no t yet approved or cleared by the [place] FDA and  has been authorized for detection and/or diagnosis of SARS-CoV-2 by FDA under an Emergency Use Authorization (EUA). This EUA will remain  in effect (meaning this test can be used) for the duration of the COVID-19 declaration under Section 564(b)(1) of the Act, 21 U.S.C. section 360bbb-3(b)(1), unless the authorization is terminated or revoked sooner.     Influenza A by PCR NEGATIVE NEGATIVE Final   Influenza B by PCR NEGATIVE NEGATIVE Final    Comment: (NOTE) The Xpert Xpress SARS-CoV-2/FLU/RSV assay is intended as an aid in  the diagnosis of influenza from Nasopharyngeal swab specimens and  should not be used as a sole basis for treatment. Nasal washings and  aspirates are unacceptable for Xpert  Xpress SARS-CoV-2/FLU/RSV  testing.  Fact Sheet for Patients: PinkCheek.be  Fact Sheet for Healthcare Providers: GravelBags.it  This test is not yet approved or cleared by the [place] FDA and  has been authorized for detection and/or diagnosis of SARS-CoV-2 by  FDA under an Emergency Use Authorization (EUA). This EUA will remain  in effect (meaning this test can be used) for the duration of the  Covid-19 declaration under Section 564(b)(1) of the Act, 21  U.S.C. section 360bbb-3(b)(1), unless the authorization is  terminated or revoked. Performed at [facility], [address] [place], [place] [address]   Culture, blood (Routine X 2) w Reflex to ID Panel     Status: None (Preliminary result)   Collection Time: 06/02/20  4:32 PM   Specimen: BLOOD  Result Value Ref Range Status   Specimen Description BLOOD RIGHT PORT  Final   Special Requests   Final    BOTTLES DRAWN AEROBIC AND ANAEROBIC Blood Culture adequate volume   Culture   Final    NO GROWTH 4 DAYS Performed at [facility], [address] [place], [place] [address]    Report Status PENDING  Incomplete  Culture, blood (Routine X 2) w Reflex to ID Panel     Status: None (Preliminary result)   Collection Time: 06/02/20  6:00 PM   Specimen: BLOOD  Result Value Ref Range Status   Specimen Description BLOOD RIGHT PORT  Final   Special Requests   Final    BOTTLES DRAWN AEROBIC AND ANAEROBIC Blood Culture adequate volume   Culture   Final    NO GROWTH 4 DAYS Performed at [facility], [address] [place], [place] [address]    Report Status PENDING  Incomplete  Urine Culture     Status: Abnormal   Collection Time: 06/02/20  6:00 PM   Specimen: Urine, Random  Result Value Ref Range Status   Specimen Description   Final    URINE, RANDOM Performed at [facility], [address], [place], [place] [address]     Special Requests   Final    NONE Performed at [facility], [address] [place], [place] [address]    Culture MULTIPLE SPECIES PRESENT, SUGGEST RECOLLECTION (A)  Final   Report Status 06/04/2020 FINAL  Final  Aerobic/Anaerobic Culture (surgical/deep wound)     Status: None (Preliminary result)   Collection Time: 06/03/20 11:14 AM   Specimen: Wound  Result Value Ref Range Status   Specimen Description   Final    WOUND Performed at [facility], [address], [place], [place] [address]    Special Requests   Final    NONE Performed at [facility], [address] [place], [place] [address]    Gram Stain   Final    RARE WBC PRESENT, PREDOMINANTLY PMN FEW GRAM POSITIVE COCCI IN PAIRS IN CLUSTERS Performed at [facility], [place] [address], [place], [place] [address]    Culture   Final    RARE METHICILLIN RESISTANT STAPHYLOCOCCUS AUREUS FEW STAPHYLOCOCCUS EPIDERMIDIS SUSCEPTIBILITIES TO FOLLOW NO ANAEROBES ISOLATED; CULTURE IN PROGRESS FOR 5 DAYS    Report Status PENDING  Incomplete   Organism ID, Bacteria METHICILLIN RESISTANT STAPHYLOCOCCUS AUREUS  Final      Susceptibility   Methicillin resistant staphylococcus aureus - MIC*    CIPROFLOXACIN >=8 RESISTANT Resistant     ERYTHROMYCIN >=8 RESISTANT Resistant     GENTAMICIN <=0.5 SENSITIVE Sensitive     OXACILLIN >=4 RESISTANT Resistant     TETRACYCLINE <=1 SENSITIVE Sensitive     VANCOMYCIN <=0.5 SENSITIVE Sensitive     TRIMETH/SULFA <=10 SENSITIVE Sensitive     CLINDAMYCIN <=0.25 SENSITIVE Sensitive     RIFAMPIN <=0.5 SENSITIVE Sensitive     Inducible Clindamycin NEGATIVE Sensitive     * RARE METHICILLIN RESISTANT STAPHYLOCOCCUS AUREUS  Body fluid culture     Status: None (Preliminary result)   Collection Time: 06/04/20  9:45 AM   Specimen: PATH Cytology Pleural fluid  Result Value Ref Range Status   Specimen Description   Final    PLEURAL Performed at [facility], [address] [place], [place] [address]    Special Requests   Final    NONE Performed at [facility], [address], [place], [place] [address]    Gram Stain  NO WBC SEEN NO ORGANISMS SEEN   Final   Culture   Final    NO GROWTH 2 DAYS Performed at [facility], [place] [address], [place], [place] [address]    Report Status PENDING  Incomplete  Gram stain     Status: None   Collection Time: 06/05/20 12:04 PM   Specimen: PATH Cytology Pleural fluid  Result Value Ref Range Status   Specimen Description   Final    PLEURAL Performed at [facility], [address], [place], [place] [address]    Special Requests   Final    NONE Performed at [facility], [address] [place], [place] [address]    Gram Stain   Final    RARE WBC PRESENT,BOTH PMN AND MONONUCLEAR NO ORGANISMS SEEN Performed at [facility], [place] [address] [place], [place] [address]    Report Status 06/06/2020 FINAL  Final     Time coordinating discharge: Over 30 minutes  SIGNED:   Sidney Ace, MD  [facility] 06/06/2020, 2:18 PM Pager   If 7PM-7AM, please contact night-coverage

## 2020-06-06 NOTE — Progress Notes (Signed)
[name]   DOB:1956/06/14   [id number]    Subjective: No acute events overnight.  Patient notes to have improved shortness of breath since thoracentesis.  Patient denies any nausea vomiting abdominal pain.  Patient is eager to go home.  Objective:  Vitals:   06/05/20 2251 06/06/20 0337  BP: 119/72 129/77  Pulse: 80 79  Resp: 16 16  Temp: 98 F (36.7 C) 98 F (36.7 C)  SpO2: 100% 100%     Intake/Output Summary (Last 24 hours) at 06/06/2020 0840 Last data filed at 06/06/2020 4650 Gross per 24 hour  Intake 0 ml  Output 250 ml  Net -250 ml    Physical Exam Constitutional:      Comments: Patient sitting in the chair.  3 L of oxygen.  Alone.  HENT:     Head: Normocephalic and atraumatic.     Mouth/Throat:     Pharynx: No oropharyngeal exudate.  Eyes:     Pupils: Pupils are equal, round, and reactive to light.  Cardiovascular:     Rate and Rhythm: Normal rate and regular rhythm.  Pulmonary:     Effort: No respiratory distress.     Breath sounds: No wheezing.     Comments: Decreased air entry bilaterally at the bases. Abdominal:     General: Bowel sounds are normal. There is no distension.     Palpations: Abdomen is soft. There is no mass.     Tenderness: There is no abdominal tenderness. There is no guarding or rebound.  Musculoskeletal:        General: No tenderness. Normal range of motion.     Cervical back: Normal range of motion and neck supple.  Skin:    General: Skin is warm.  Neurological:     Mental Status: She is alert and oriented to person, place, and time.  Psychiatric:        Mood and Affect: Affect normal.      Labs:  Lab Results  Component Value Date   WBC 13.4 (H) 06/06/2020   HGB 9.7 (L) 06/06/2020   HCT 29.4 (L) 06/06/2020   MCV 89.4 06/06/2020   PLT 397 06/06/2020   NEUTROABS 9.9 (H) 06/06/2020    Lab Results  Component Value Date   NA 139 06/06/2020   K 4.8 06/06/2020   CL 102 06/06/2020   CO2 27 06/06/2020    Studies:  DG  Chest Port 1 View  Result Date: 06/05/2020 CLINICAL DATA:  Status post right-sided thoracentesis. EXAM: PORTABLE CHEST 1 VIEW COMPARISON:  One-view chest x-ray 08/05/2019 FINDINGS: Heart is enlarged. Right IJ Port-A-Cath is stable. Right pleural effusion has decreased. No pneumothorax is present. Left pleural effusion persists. Bilateral associated airspace disease is present. Mild edema is present. IMPRESSION: 1. Decrease in right pleural effusion without pneumothorax. 2. Persistent bilateral pleural effusions and bibasilar airspace disease. Electronically Signed   By: San Morelle M.D.   On: 06/05/2020 12:07   DG Chest Port 1 View  Result Date: 06/04/2020 CLINICAL DATA:  Status post thoracentesis from the left. EXAM: PORTABLE CHEST 1 VIEW COMPARISON:  Chest radiograph 06/02/2020. FINDINGS: Right anterior chest wall Port-A-Cath is present with tip projecting over the superior vena cava. Monitoring leads overlie the patient. Stable enlarged cardiac and mediastinal contours. Persistent moderate bilateral pleural effusions and underlying consolidation. No definite pneumothorax. Thoracic spine degenerative changes. IMPRESSION: Persistent moderate bilateral pleural effusions and underlying consolidation. Electronically Signed   By: Lovey Newcomer M.D.   On: 06/04/2020 10:23  US THORACENTESIS ASP PLEURAL SPACE W/IMG GUIDE  Result Date: 06/05/2020 INDICATION: Shortness of breath. History of breast cancer. Right-sided pleural effusion. Request for diagnostic and therapeutic thoracentesis. EXAM: ULTRASOUND GUIDED RIGHT THORACENTESIS MEDICATIONS: 1% plain lidocaine, 5 mL COMPLICATIONS: None immediate. Postprocedural chest x-ray negative for pneumothorax PROCEDURE: An ultrasound guided thoracentesis was thoroughly discussed with the patient and questions answered. The benefits, risks, alternatives and complications were also discussed. The patient understands and wishes to proceed with the procedure. Written  consent was obtained. Ultrasound was performed to localize and mark an adequate pocket of fluid in the right chest. The area was then prepped and draped in the normal sterile fashion. 1% Lidocaine was used for local anesthesia. Under ultrasound guidance a 6 Fr Safe-T-Centesis catheter was introduced. Thoracentesis was performed. The catheter was removed and a dressing applied. FINDINGS: A total of approximately 550 mL of blood-tinged pleural fluid was removed. Samples were sent to the laboratory as requested by the clinical team. IMPRESSION: Successful ultrasound guided right thoracentesis yielding 550 mL of pleural fluid. Read by: Ascencion Dike PA-C Electronically Signed   By: Markus Daft M.D.   On: 06/05/2020 12:16   US THORACENTESIS ASP PLEURAL SPACE W/IMG GUIDE  Result Date: 06/04/2020 INDICATION: Patient with metastatic breast cancer, now with bilateral pleural effusions. Request made for diagnostic and therapeutic thoracentesis EXAM: ULTRASOUND GUIDED DIAGNOSTIC AND THERAPEUTIC LEFT THORACENTESIS MEDICATIONS: 10 mL 1% lidocaine COMPLICATIONS: None immediate. PROCEDURE: An ultrasound guided thoracentesis was thoroughly discussed with the patient and questions answered. The benefits, risks, alternatives and complications were also discussed. The patient understands and wishes to proceed with the procedure. Written consent was obtained. Ultrasound was performed to localize and mark an adequate pocket of fluid in the left chest. The area was then prepped and draped in the normal sterile fashion. 1% Lidocaine was used for local anesthesia. Under ultrasound guidance a 6 Fr Safe-T-Centesis catheter was introduced. Thoracentesis was performed. The catheter was removed and a dressing applied. FINDINGS: A total of approximately 500 mL of blood-tinged fluid was removed. Samples were sent to the laboratory as requested by the clinical team. Procedure stopped prior to removal of all fluid due to patient's complaint of  discomfort. IMPRESSION: Successful ultrasound guided left thoracentesis yielding 500 mL of pleural fluid. Read by: Brynda Greathouse PA-C Electronically Signed   By: Markus Daft M.D.   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  Await fluid cytology.  #Metastatic left breast cancer ER/PR +, HER-2/neu-currently on gemcitabine chemotherapy s/p cycle number 2-day 1.  October 30 CT scan unfortunately-shows progression of disease-pleural metastases/bilateral effusion; worsening lytic lesions in the bones.  Patient is interested in continuing palliative chemotherapy.  Will reassess next week; and if patient clinically stable/improved-we will consider Doxil chemotherapy.  # Anemia-hemoglobin-nadir 6.7 s/p PRBC transfusion hemoglobin-9.7- STABLE.   # Chronic kidney disease - stage IV [GFR-24]- STABLE.   # Bone mets-sclerotic; Right acetabular uptake-s/p radiation; CT scan shows worsening metastases in the vertebrae; currently asymptomatic stable.  See below  #Leukocytosis-up to 20,000 on admission-question infection on broad-spectrum antibiotics.  Appreciate evaluation by podiatry-no obvious evidence of worsening foot  infection or osteomyelitis.  Improving-recommend oral antibiotics at discharge.   Cammie Sickle, MD 06/06/2020  8:40 AM

## 2020-06-06 NOTE — Progress Notes (Signed)
Inpatient Diabetes Program Recommendations  AACE/ADA: New Consensus Statement on Inpatient Glycemic Control   Target Ranges:  Prepandial:   less than 140 mg/dL      Peak postprandial:   less than 180 mg/dL (1-2 hours)      Critically ill patients:  140 - 180 mg/dL   Results for TRU, LEOPARD (MRN [id number]) as of 06/06/2020 12:37  Ref. Range 06/05/2020 07:53 06/05/2020 16:04 06/05/2020 21:16 06/06/2020 07:57 06/06/2020 11:24  Glucose-Capillary Latest Ref Range: 70 - 99 mg/dL 135 (H) 162 (H) 196 (H) 218 (H) 306 (H)   Review of Glycemic Control  Diabetes history: DM2 Outpatient Diabetes medications: Levemir 25 units daily, Novolog 7 units BID with meals, Glyburide 10 mg BID Current orders for Inpatient glycemic control: Novolog 0-15 units TID with meals  Inpatient Diabetes Program Recommendations:    Insulin: Please consider ordering Levemir 5 units Q24H and Novoog 3 units TID with meals for meal coverage if patient eats at least 50% of meals.  Thanks, Barnie Alderman, RN, MSN, CDE Diabetes Coordinator Inpatient Diabetes Program [phone] (Team Pager from 8am to 5pm)

## 2020-06-06 NOTE — Progress Notes (Signed)
[facility]  ROUNDING NOTE   Subjective:   Patient appears comfortable, sitting on a recliner.She reports feeling much better after bilateral thoracentesis. She denies worsening SOB, on 3L of supplemental O2 via nasal cannula.Dr.Kolluru discussed about future plans, may require dialysis if kidney function declines further.No acute indication for dialysis on this admission. Patient verbalized understanding about the plans.  Objective:  Vital signs in last 24 hours:  Temp:  [97.4 F (36.3 C)-98.5 F (36.9 C)] 97.8 F (36.6 C) (11/03 1120) Pulse Rate:  [77-81] 77 (11/03 1120) Resp:  [16-18] 16 (11/03 1120) BP: (119-129)/(72-80) 119/79 (11/03 1120) SpO2:  [94 %-100 %] 100 % (11/03 1120)  Weight change:  Filed Weights   06/02/20 1043  Weight: 86.6 kg    Intake/Output: I/O last 3 completed shifts: In: 10 [I.V.:10] Out: 450 [Urine:450]   Intake/Output this shift:  No intake/output data recorded.  Physical Exam: General: Awake,alert, sitting up in a recliner  Head:  Normocephalic,Atraumatic  Eyes: Sclerae and conjunctivae clear  Neck: trachea at midline  Lungs:  Respirations symmetrical, unlabored,Lungs diminished at the bases  Heart: S1S2,no rubs or gallops  Abdomen:  Soft, nontender, non distended  Extremities:  2+ peripheral edema.  Neurologic: Oriented, Speech clear and appropriate  Skin: No acute lesions or rashes       Basic Metabolic Panel: Recent Labs  Lab 06/02/20 1048 06/02/20 1048 06/03/20 0504 06/04/20 0446 06/05/20 0515 06/06/20 0440  NA 136  --  137  --  138 139  K 4.5  --  4.2  --  4.7 4.8  CL 98  --  101  --  102 102  CO2 25  --  27  --  27 27  GLUCOSE 267*  --  80  --  108* 223*  BUN 50*  --  46*  --  42* 43*  CREATININE 3.17*  --  2.73* 2.42* 2.42* 2.27*  CALCIUM 8.5*   < > 8.3*  --  8.7* 8.8*   < > = values in this interval not displayed.    Liver Function Tests: No results for input(s): AST, ALT, ALKPHOS, BILITOT, PROT,  ALBUMIN in the last 168 hours. No results for input(s): LIPASE, AMYLASE in the last 168 hours. No results for input(s): AMMONIA in the last 168 hours.  CBC: Recent Labs  Lab 06/02/20 1048 06/02/20 1048 06/03/20 0504 06/04/20 0446 06/04/20 1929 06/05/20 0515 06/06/20 0440  WBC 20.5*  --  15.1* 6.6  --  10.1 13.4*  NEUTROABS  --   --   --  4.6  --  7.2 9.9*  HGB 7.9*   < > 6.4* 7.6* 9.2* 9.6* 9.7*  HCT 24.4*   < > 19.5* 23.3* 29.7* 29.4* 29.4*  MCV 90.0  --  89.9 90.0  --  88.6 89.4  PLT 596*  --  445* 417*  --  418* 397   < > = values in this interval not displayed.    Cardiac Enzymes: No results for input(s): CKTOTAL, CKMB, CKMBINDEX, TROPONINI in the last 168 hours.  BNP: Invalid input(s): POCBNP  CBG: Recent Labs  Lab 06/05/20 0753 06/05/20 1604 06/05/20 2116 06/06/20 0757 06/06/20 1124  GLUCAP 135* 162* 196* 218* 306*    Microbiology: Results for orders placed or performed during the hospital encounter of 06/02/20  Respiratory Panel by RT PCR (Flu A&B, Covid) - Nasopharyngeal Swab     Status: None   Collection Time: 06/02/20  2:08 PM   Specimen: Nasopharyngeal Swab  Result Value Ref Range Status   SARS Coronavirus 2 by RT PCR NEGATIVE NEGATIVE Final    Comment: (NOTE) SARS-CoV-2 target nucleic acids are NOT DETECTED.  The SARS-CoV-2 RNA is generally detectable in upper respiratoy specimens during the acute phase of infection. The lowest concentration of SARS-CoV-2 viral copies this assay can detect is 131 copies/mL. A negative result does not preclude SARS-Cov-2 infection and should not be used as the sole basis for treatment or other patient management decisions. A negative result may occur with  improper specimen collection/handling, submission of specimen other than nasopharyngeal swab, presence of viral mutation(s) within the areas targeted by this assay, and inadequate number of viral copies (<131 copies/mL). A negative result must be combined with  clinical observations, patient history, and epidemiological information. The expected result is Negative.  Fact Sheet for Patients:  PinkCheek.be  Fact Sheet for Healthcare Providers:  GravelBags.it  This test is no t yet approved or cleared by the [place] FDA and  has been authorized for detection and/or diagnosis of SARS-CoV-2 by FDA under an Emergency Use Authorization (EUA). This EUA will remain  in effect (meaning this test can be used) for the duration of the COVID-19 declaration under Section 564(b)(1) of the Act, 21 U.S.C. section 360bbb-3(b)(1), unless the authorization is terminated or revoked sooner.     Influenza A by PCR NEGATIVE NEGATIVE Final   Influenza B by PCR NEGATIVE NEGATIVE Final    Comment: (NOTE) The Xpert Xpress SARS-CoV-2/FLU/RSV assay is intended as an aid in  the diagnosis of influenza from Nasopharyngeal swab specimens and  should not be used as a sole basis for treatment. Nasal washings and  aspirates are unacceptable for Xpert Xpress SARS-CoV-2/FLU/RSV  testing.  Fact Sheet for Patients: PinkCheek.be  Fact Sheet for Healthcare Providers: GravelBags.it  This test is not yet approved or cleared by the [place] FDA and  has been authorized for detection and/or diagnosis of SARS-CoV-2 by  FDA under an Emergency Use Authorization (EUA). This EUA will remain  in effect (meaning this test can be used) for the duration of the  Covid-19 declaration under Section 564(b)(1) of the Act, 21  U.S.C. section 360bbb-3(b)(1), unless the authorization is  terminated or revoked. Performed at [facility], [address] [place], [place] [address]   Culture, blood (Routine X 2) w Reflex to ID Panel     Status: None (Preliminary result)   Collection Time: 06/02/20  4:32 PM   Specimen: BLOOD  Result Value Ref Range Status    Specimen Description BLOOD RIGHT PORT  Final   Special Requests   Final    BOTTLES DRAWN AEROBIC AND ANAEROBIC Blood Culture adequate volume   Culture   Final    NO GROWTH 4 DAYS Performed at [facility], [address] [place], [place] [address]    Report Status PENDING  Incomplete  Culture, blood (Routine X 2) w Reflex to ID Panel     Status: None (Preliminary result)   Collection Time: 06/02/20  6:00 PM   Specimen: BLOOD  Result Value Ref Range Status   Specimen Description BLOOD RIGHT PORT  Final   Special Requests   Final    BOTTLES DRAWN AEROBIC AND ANAEROBIC Blood Culture adequate volume   Culture   Final    NO GROWTH 4 DAYS Performed at [facility], [address] [place], [place] [address]    Report Status PENDING  Incomplete  Urine Culture  Status: Abnormal   Collection Time: 06/02/20  6:00 PM   Specimen: Urine, Random  Result Value Ref Range Status   Specimen Description   Final    URINE, RANDOM Performed at [facility], [address] [place], [place] [address]    Special Requests   Final    NONE Performed at [facility], [address] [place], [place] [address]    Culture MULTIPLE SPECIES PRESENT, SUGGEST RECOLLECTION (A)  Final   Report Status 06/04/2020 FINAL  Final  Aerobic/Anaerobic Culture (surgical/deep wound)     Status: None (Preliminary result)   Collection Time: 06/03/20 11:14 AM   Specimen: Wound  Result Value Ref Range Status   Specimen Description   Final    WOUND Performed at [facility], [address], [place], [place] [address]    Special Requests   Final    NONE Performed at [facility], [address] [place], [place] [address]    Gram Stain   Final    RARE WBC PRESENT, PREDOMINANTLY PMN FEW GRAM POSITIVE COCCI IN PAIRS IN CLUSTERS Performed at [facility], [place] [address], [place], [place] [address]    Culture   Final    RARE METHICILLIN RESISTANT  STAPHYLOCOCCUS AUREUS FEW STAPHYLOCOCCUS EPIDERMIDIS SUSCEPTIBILITIES TO FOLLOW NO ANAEROBES ISOLATED; CULTURE IN PROGRESS FOR 5 DAYS    Report Status PENDING  Incomplete   Organism ID, Bacteria METHICILLIN RESISTANT STAPHYLOCOCCUS AUREUS  Final      Susceptibility   Methicillin resistant staphylococcus aureus - MIC*    CIPROFLOXACIN >=8 RESISTANT Resistant     ERYTHROMYCIN >=8 RESISTANT Resistant     GENTAMICIN <=0.5 SENSITIVE Sensitive     OXACILLIN >=4 RESISTANT Resistant     TETRACYCLINE <=1 SENSITIVE Sensitive     VANCOMYCIN <=0.5 SENSITIVE Sensitive     TRIMETH/SULFA <=10 SENSITIVE Sensitive     CLINDAMYCIN <=0.25 SENSITIVE Sensitive     RIFAMPIN <=0.5 SENSITIVE Sensitive     Inducible Clindamycin NEGATIVE Sensitive     * RARE METHICILLIN RESISTANT STAPHYLOCOCCUS AUREUS  Body fluid culture     Status: None (Preliminary result)   Collection Time: 06/04/20  9:45 AM   Specimen: PATH Cytology Pleural fluid  Result Value Ref Range Status   Specimen Description   Final    PLEURAL Performed at [facility], [address] [place], [place] [address]    Special Requests   Final    NONE Performed at [facility], [address] [place], [place] [address]    Gram Stain NO WBC SEEN NO ORGANISMS SEEN   Final   Culture   Final    NO GROWTH 2 DAYS Performed at [facility], [place] [address] [place], [place] [address]    Report Status PENDING  Incomplete  Gram stain     Status: None   Collection Time: 06/05/20 12:04 PM   Specimen: PATH Cytology Pleural fluid  Result Value Ref Range Status   Specimen Description   Final    PLEURAL Performed at [facility], [address] [place], [place] [address]    Special Requests   Final    NONE Performed at [facility], [address] [place], [place] [address]    Gram Stain   Final    RARE WBC PRESENT,BOTH PMN AND MONONUCLEAR NO ORGANISMS SEEN Performed at [facility],  [place] [address] [place], [place] [address]    Report Status 06/06/2020  FINAL  Final    Coagulation Studies: No results for input(s): LABPROT, INR in the last 72 hours.  Urinalysis: No results for input(s): COLORURINE, LABSPEC, PHURINE, GLUCOSEU, HGBUR, BILIRUBINUR, KETONESUR, PROTEINUR, UROBILINOGEN, NITRITE, LEUKOCYTESUR in the last 72 hours.  Invalid input(s): APPERANCEUR    Imaging: DG Chest Port 1 View  Result Date: 06/05/2020 CLINICAL DATA:  Status post right-sided thoracentesis. EXAM: PORTABLE CHEST 1 VIEW COMPARISON:  One-view chest x-ray 08/05/2019 FINDINGS: Heart is enlarged. Right IJ Port-A-Cath is stable. Right pleural effusion has decreased. No pneumothorax is present. Left pleural effusion persists. Bilateral associated airspace disease is present. Mild edema is present. IMPRESSION: 1. Decrease in right pleural effusion without pneumothorax. 2. Persistent bilateral pleural effusions and bibasilar airspace disease. Electronically Signed   By: San Morelle M.D.   On: 06/05/2020 12:07   US THORACENTESIS ASP PLEURAL SPACE W/IMG GUIDE  Result Date: 06/05/2020 INDICATION: Shortness of breath. History of breast cancer. Right-sided pleural effusion. Request for diagnostic and therapeutic thoracentesis. EXAM: ULTRASOUND GUIDED RIGHT THORACENTESIS MEDICATIONS: 1% plain lidocaine, 5 mL COMPLICATIONS: None immediate. Postprocedural chest x-ray negative for pneumothorax PROCEDURE: An ultrasound guided thoracentesis was thoroughly discussed with the patient and questions answered. The benefits, risks, alternatives and complications were also discussed. The patient understands and wishes to proceed with the procedure. Written consent was obtained. Ultrasound was performed to localize and mark an adequate pocket of fluid in the right chest. The area was then prepped and draped in the normal sterile fashion. 1% Lidocaine was used for local anesthesia. Under ultrasound guidance a 6 Fr  Safe-T-Centesis catheter was introduced. Thoracentesis was performed. The catheter was removed and a dressing applied. FINDINGS: A total of approximately 550 mL of blood-tinged pleural fluid was removed. Samples were sent to the laboratory as requested by the clinical team. IMPRESSION: Successful ultrasound guided right thoracentesis yielding 550 mL of pleural fluid. Read by: Ascencion Dike PA-C Electronically Signed   By: Markus Daft M.D.   On: 06/05/2020 12:16     Medications:   . sodium chloride     . sodium chloride   Intravenous Once  . amLODipine  10 mg Oral Daily  . atenolol  50 mg Oral Daily  . bumetanide  0.5 mg Oral BID  . calcitRIOL  0.25 mcg Oral Daily  . cloNIDine  0.2 mg Oral BID  . docusate sodium  100 mg Oral Daily  . famotidine  20 mg Oral Daily  . feeding supplement (GLUCERNA SHAKE)  237 mL Oral BID BM  . ferrous sulfate  325 mg Oral Q breakfast  . FLUoxetine  20 mg Oral BID  . gabapentin  100 mg Oral QHS  . heparin lock flush  500 Units Intravenous STAT  . insulin aspart  0-15 Units Subcutaneous TID WC  . simvastatin  20 mg Oral QHS  . sodium chloride flush  3 mL Intravenous Q12H   sodium chloride, albuterol, ALPRAZolam, bisacodyl, ipratropium-albuterol, ondansetron **OR** ondansetron (ZOFRAN) IV, oxyCODONE-acetaminophen, senna, sodium chloride flush  Assessment/ Plan:  [name] is a 64 y.o.  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 Chronic kidney disease stage IV    Lab Results  Component Value Date   CREATININE 2.27 (H) 06/06/2020   CREATININE  2.42 (H) 06/05/2020   CREATININE 2.42 (H) 06/04/2020   Creatinine slightly better today Briefly discussed about dialysis  options if renal function deteriorates Will consider vein mapping as outpatient Patient can be followed as outpatient by Nephrology if getting discharged today No acute indication for dialysis   2. Hypertension Normotensive  Continue current antihypertensive regimen   3.  Anemia of CKD and chemotherapy induced  Hemoglobin 9.7 today,at the goal  4. Volume Overload Received IV Bumex yesterday Bilateral lower extremity edema improved slightly  Will increase Bumex PO dose on her home medications           LOS: 4 [name] 11/3/20212:36 PM

## 2020-06-07 ENCOUNTER — Other Ambulatory Visit: Payer: Self-pay | Admitting: Internal Medicine

## 2020-06-07 ENCOUNTER — Other Ambulatory Visit: Payer: Self-pay

## 2020-06-07 DIAGNOSIS — I051 Rheumatic mitral insufficiency: Secondary | ICD-10-CM | POA: Diagnosis not present

## 2020-06-07 DIAGNOSIS — C50212 Malignant neoplasm of upper-inner quadrant of left female breast: Secondary | ICD-10-CM

## 2020-06-07 DIAGNOSIS — Z170 Estrogen receptor positive status [ER+]: Secondary | ICD-10-CM

## 2020-06-07 DIAGNOSIS — I130 Hypertensive heart and chronic kidney disease with heart failure and stage 1 through stage 4 chronic kidney disease, or unspecified chronic kidney disease: Secondary | ICD-10-CM | POA: Diagnosis not present

## 2020-06-07 DIAGNOSIS — N184 Chronic kidney disease, stage 4 (severe): Secondary | ICD-10-CM | POA: Diagnosis not present

## 2020-06-07 DIAGNOSIS — E1122 Type 2 diabetes mellitus with diabetic chronic kidney disease: Secondary | ICD-10-CM | POA: Diagnosis not present

## 2020-06-07 DIAGNOSIS — Z4781 Encounter for orthopedic aftercare following surgical amputation: Secondary | ICD-10-CM | POA: Diagnosis not present

## 2020-06-07 DIAGNOSIS — J45909 Unspecified asthma, uncomplicated: Secondary | ICD-10-CM | POA: Diagnosis not present

## 2020-06-07 DIAGNOSIS — I5042 Chronic combined systolic (congestive) and diastolic (congestive) heart failure: Secondary | ICD-10-CM | POA: Diagnosis not present

## 2020-06-07 DIAGNOSIS — E1142 Type 2 diabetes mellitus with diabetic polyneuropathy: Secondary | ICD-10-CM | POA: Diagnosis not present

## 2020-06-07 DIAGNOSIS — C50919 Malignant neoplasm of unspecified site of unspecified female breast: Secondary | ICD-10-CM | POA: Diagnosis not present

## 2020-06-07 LAB — CULTURE, BLOOD (ROUTINE X 2)
Culture: NO GROWTH
Culture: NO GROWTH
Special Requests: ADEQUATE
Special Requests: ADEQUATE

## 2020-06-07 LAB — BODY FLUID CULTURE
Culture: NO GROWTH
Gram Stain: NONE SEEN

## 2020-06-07 NOTE — Progress Notes (Signed)
DISCONTINUE ON PATHWAY REGIMEN - Breast     A cycle is every 21 days:     Gemcitabine   **Always confirm dose/schedule in your pharmacy ordering system**  REASON: Toxicities / Adverse Event PRIOR TREATMENT: [id number]: Gemcitabine 800 mg/m2 D1, 8 q21 Days TREATMENT RESPONSE: Progressive Disease (PD)  START ON PATHWAY REGIMEN - Breast     A cycle is every 28 days:     Liposomal doxorubicin   **Always confirm dose/schedule in your pharmacy ordering system**  Patient Characteristics: Distant Metastases or Locoregional Recurrent Disease - Unresected or Locally Advanced Unresectable Disease Progressing after Neoadjuvant and Local Therapies, HER2 Negative/Unknown/Equivocal, ER Positive, Chemotherapy, Third Line and Beyond, Anthracycline  Candidate Therapeutic Status: Distant Metastases ER Status: Positive (+) HER2 Status: Negative (-) PR Status: Positive (+) Therapy Approach Indicated: Standard Chemotherapy/Endocrine Therapy Line of Therapy: Third Line and Beyond Intent of Therapy: Non-Curative / Palliative Intent, Discussed with Patient

## 2020-06-08 LAB — AEROBIC/ANAEROBIC CULTURE W GRAM STAIN (SURGICAL/DEEP WOUND)

## 2020-06-11 ENCOUNTER — Other Ambulatory Visit: Payer: Self-pay

## 2020-06-11 NOTE — Patient Outreach (Signed)
[facility]) Care Management  06/11/2020  [name] 1956-01-17 [id number]   EMMI- General Discharge RED ON EMMI ALERT Day # 1 Date: 06/08/20 Red Alert Reason:  Unfilled prescriptions? Yes  Outreach attempt:Spoke with patient. She reports she is doing good since being home. Making adjustments.  Addressed red alert. Patient states she has all her medications.  Patient has appointment on tomorrow to see wound clinic. She states that home health is coming out to see her twice a week.  Discussed further telephonic follow up. Patient declined at this time.    Plan: RN CM will close case.    Jone Baseman, RN, MSN [facility] Care Management Care Management Coordinator Direct Line [phone] Toll Free: [phone]  Fax: [phone]

## 2020-06-12 ENCOUNTER — Other Ambulatory Visit: Payer: Self-pay

## 2020-06-12 ENCOUNTER — Encounter: Payer: Medicare HMO | Attending: Physician Assistant | Admitting: Physician Assistant

## 2020-06-12 DIAGNOSIS — L97512 Non-pressure chronic ulcer of other part of right foot with fat layer exposed: Secondary | ICD-10-CM | POA: Insufficient documentation

## 2020-06-12 DIAGNOSIS — I110 Hypertensive heart disease with heart failure: Secondary | ICD-10-CM | POA: Diagnosis not present

## 2020-06-12 DIAGNOSIS — E11621 Type 2 diabetes mellitus with foot ulcer: Secondary | ICD-10-CM | POA: Diagnosis not present

## 2020-06-12 DIAGNOSIS — I5042 Chronic combined systolic (congestive) and diastolic (congestive) heart failure: Secondary | ICD-10-CM | POA: Insufficient documentation

## 2020-06-12 DIAGNOSIS — L97522 Non-pressure chronic ulcer of other part of left foot with fat layer exposed: Secondary | ICD-10-CM | POA: Diagnosis not present

## 2020-06-12 DIAGNOSIS — L97523 Non-pressure chronic ulcer of other part of left foot with necrosis of muscle: Secondary | ICD-10-CM | POA: Diagnosis not present

## 2020-06-12 NOTE — Progress Notes (Addendum)
Gloria, [name] ([id number]) Visit Report for 06/12/2020 Chief Complaint Document Details Patient Name: [name], [name] Date of Service: 06/12/2020 11:00 AM Medical Record Number: [id number] Patient Account Number: [id number] Date of Birth/Sex: October 08, 1955 (64 y.o. F) Treating RN: Cornell Barman Primary Care Provider: [name] Other Clinician: Referring Provider: [name] Treating Provider/Extender: Skipper Cliche in Treatment: 5 Information Obtained from: Patient Chief Complaint Left foot surgical ulcer Electronic Signature(s) Signed: 06/12/2020 11:00:11 AM By: Worthy Keeler PA-C Entered By: Worthy Keeler on 06/12/2020 11:00:11 Schoening, [name] ([id number]) -------------------------------------------------------------------------------- Debridement Details Patient Name: Gloria Both A. Date of Service: 06/12/2020 11:00 AM Medical Record Number: [id number] Patient Account Number: [id number] Date of Birth/Sex: August 15, 1955 (64 y.o. F) Treating RN: Dolan Amen Primary Care Provider: [name] Other Clinician: Referring Provider: [name] Treating Provider/Extender: Skipper Cliche in Treatment: 5 Debridement Performed for Wound #4 Left,Lateral Foot Assessment: Performed By: Physician Tommie Sams., PA-C Debridement Type: Debridement Severity of Tissue Pre Debridement: Fat layer exposed Level of Consciousness (Pre- Awake and Alert procedure): Pre-procedure Verification/Time Out Yes - 11:15 Taken: Start Time: 11:15 Pain Control: Lidocaine 4% Topical Solution Total Area Debrided (L x W): 6 (cm) x 0.9 (cm) = 5.4 (cm) Tissue and other material Viable, Non-Viable, Slough, Subcutaneous, Slough debrided: Level: Skin/Subcutaneous Tissue Debridement Description: Excisional Instrument: Curette Bleeding: Moderate Hemostasis Achieved: Silver Nitrate End Time: 11:18 Procedural Pain: 0 Post Procedural Pain: 0 Response to Treatment: Procedure was  tolerated well Level of Consciousness (Post- Awake and Alert procedure): Post Debridement Measurements of Total Wound Length: (cm) 6 Width: (cm) 0.9 Depth: (cm) 1 Volume: (cm) 4.241 Character of Wound/Ulcer Post Debridement: Stable Severity of Tissue Post Debridement: Fat layer exposed Post Procedure Diagnosis Same as Pre-procedure Electronic Signature(s) Signed: 06/12/2020 11:46:44 AM By: Charlett Nose Signed: 06/12/2020 4:53:02 PM By: Worthy Keeler PA-C Entered By: Georges Mouse, Minus Breeding on 06/12/2020 11:26:32 [place]unting, [place] Kitchen ([id number]) -------------------------------------------------------------------------------- HPI Details Patient Name: Gloria Both A. Date of Service: 06/12/2020 11:00 AM Medical Record Number: [id number] Patient Account Number: [id number] Date of Birth/Sex: 16-Dec-1955 (64 y.o. F) Treating RN: Cornell Barman Primary Care Provider: [name] Other Clinician: Referring Provider: [name] Treating Provider/Extender: Skipper Cliche in Treatment: 5 History of Present Illness HPI Description: 12/27/2019 upon evaluation today patient appears to be doing somewhat poorly upon initial inspection here in the clinic. She has unfortunately been having issues with for the past week a blister over her right heel that started on the plantar aspect and has spread to the medial aspect. She does have a history of diabetes mellitus type 2 she also has hypertension along with congestive heart failure. With that being said she was supposed to be having knee surgery on her right knee but this was postponed by the surgeon and she was referred to [place] due to the blister noted. Fortunately there is no signs of active infection at this time. With that being said I am concerned about the fact that this could develop into infection. Obviously we want to prevent such from happening. She does note that she been placed on a antibiotic for urinary tract infection by  her primary care provider she has that to pick up. I asked her to call and let us know what that antibiotic was so that we can put that in the chart. For that reason I am not can give her anything empirically at this point. The patient cannot remember any injury that she had to this region in fact she  really does not know how this began at all other than the fact that it "just showed up". 01/05/2020 upon evaluation today patient appears to be doing about the same in regard to her heel ulcer. She has been tolerating the dressing changes without complication. Fortunately there is no signs of active infection at this time. No fevers, chills, nausea, vomiting, or diarrhea. With that being said I do believe the skin is getting need to be removed from the heel where this is somewhat deflated there is still a lot of blood collecting underneath and I think this is not can I do her any good to be perfectly honest. Plus we do not really know exactly what everything looks like underneath as far as the actual wound is concerned. Obviously we need to figure that out. 01/12/2020 upon evaluation today patient's wound actually appears to be doing excellent in fact this is very close to complete resolution. Subsequently I also did send a culture after removing the blood blister last week and the patient did not have any bacteria noted just normal skin flora and no growth otherwise after 2 days. Fortunately there is no signs of active infection at this time systemically or locally. Overall I feel that she is doing excellent. 01/19/2020 upon evaluation today patient appears to be doing excellent at this time in regard to her heel ulcer for the most part. The one issue that we do see is that she is having a new blistered area that amount to clear away some of the skin from today. This seems to be due to her foot slipping in the offloading shoe that she currently is utilizing. Fortunately there is no signs of active infection  at this time. No fevers, chills, nausea, vomiting, or diarrhea. 01/26/2020 upon evaluation today patient's wound actually showed signs of excellent improvement. She has made great progress even since last week's visit. I do believe that she continues to tolerate the dressing changes without any complication and I am very pleased with that as well. In general I think that we will likely continue with the current measures as long as we are continuing to see the results that we are at this point. 02/02/2020 upon evaluation today patient appears to be doing excellent in regard to her wounds currently. She has been tolerating the dressing changes without complication in fact this appears to be completely healed today based on what I am seeing. Readmission: 03/26/2020 on evaluation today patient presents for readmission concerning an issue that she is actually having with her opposite foot compared to the one that I treated last time she was with this back in July when she healed. Subsequently she tells me that she really does not know how this came up. She does not have any particular injury that she is aware of at this point. With that being said she tells me that the area came up somewhat unexpectedly and has been dark and similar to what she had before. She has had some drainage from it as well. She has been on Keflex initially for urinary tract infection then her doctor actually gave her a second round due to the heel itself in order to try to help with this. Nonetheless she still has erythema and warmth of the foot I am concerned that the Keflex is not the appropriate medication. Previously we used Augmentin but that is very similar to the Keflex to be honest. I am not really sure if that would be the best option for  her at this time. Nonetheless she probably needs something oral while we await the results of the culture and then if we need to make any adjustments or changes we can do so at that point. I  do need to keep her kidney function in consideration with this. 05/04/2020 upon evaluation today patient presents for reevaluation here in the clinic after having had surgery as performed by Dr. Vickki Muff. She actually goes back to see him this coming week on October 5. She still has sutures in place at the moment currently he has recommended Betadine to the wound area followed by dry gauze dressing and cleansing with saline. Her surgery was roughly 4 weeks ago. She tells me that she is really not having any significant pain which is great news. Overall I am pleased with where things stand. I do believe that once the sutures are out this may potentially slightly dehisced nonetheless I think that we should be able to hopefully get this to close effectively without much complication. However time will tell we will see what happens once the sutures are indeed removed. 05/11/2020 upon evaluation today patient appears to be doing decently well in regard to her foot ulcer. Fortunately there is no signs of active infection at this time. Actually appears to be healing okay and the sutures have been removed at this point. I do believe talking the alginate into the crease where this is healing will be appropriate to try to keep this area nice and clean and allow it to hopefully continue to heal. Fortunately there is no signs of active infection. 05/29/2020 upon evaluation today patient appears to be doing excellent in regard to her foot ulcer all things considered she does have some necrotic tissue in the central portion of the wound but fortunately this does not seem to be a significant issue at this point. Overall I am extremely pleased with how the patient in general seems to be progressing. 06/12/2020 upon evaluation today patient appears to be doing well in regard to her foot ulcer all things considered. She was in the hospital due to a lung infection although she tells me she is doing better now in the hospital  they did change her dressing and even the surgeon's physician assistant did come by and see her while in the hospital. Overall they are still pleased with where things stand. Nonetheless I still think this can Sider, Danyetta A. ([id number]) take quite some time to get this wound to completely close. That again was discussed with the patient today although I do feel like she is making progress. Electronic Signature(s) Signed: 06/12/2020 1:37:24 PM By: Worthy Keeler PA-C Entered By: Worthy Keeler on 06/12/2020 13:37:23 Corsetti, [name] ([id number]) -------------------------------------------------------------------------------- Physical Exam Details Patient Name: Gloria Both A. Date of Service: 06/12/2020 11:00 AM Medical Record Number: [id number] Patient Account Number: [id number] Date of Birth/Sex: Jan 21, 1956 (64 y.o. F) Treating RN: Cornell Barman Primary Care Provider: [name] Other Clinician: Referring Provider: [name] Treating Provider/Extender: Skipper Cliche in Treatment: 5 Constitutional Well-nourished and well-hydrated in no acute distress. Respiratory normal breathing without difficulty. Psychiatric this patient is able to make decisions and demonstrates good insight into disease process. Alert and Oriented x 3. pleasant and cooperative. Notes On inspection patient's wound bed actually showed signs of good granulation at this time there was slough noted in the crease where the wound does go somewhat deeper I did perform sharp debridement to clear away some of the necrotic debris here down to  fairly good subcutaneous tissue this is excellent news. With that being said I think we need to continue to monitor for any signs of infection or worsening in general if anything does occur then of course we need to address that as necessary. Right now she appears to be doing excellent. Electronic Signature(s) Signed: 06/12/2020 1:37:53 PM By: Worthy Keeler  PA-C Entered By: Worthy Keeler on 06/12/2020 13:37:52 [place]aman, [name] ([id number]) -------------------------------------------------------------------------------- Physician Orders Details Patient Name: Gloria Both A. Date of Service: 06/12/2020 11:00 AM Medical Record Number: [id number] Patient Account Number: [id number] Date of Birth/Sex: [birth year] (64 y.o. F) Treating RN: Dolan Amen Primary Care Provider: [name] Other Clinician: Referring Provider: [name] Treating Provider/Extender: Skipper Cliche in Treatment: 5 Verbal / Phone Orders: No Diagnosis Coding ICD-10 Coding Code Description E11.621 Type 2 diabetes mellitus with foot ulcer L97.523 Non-pressure chronic ulcer of other part of left foot with necrosis of muscle I10 Essential (primary) hypertension I50.42 Chronic combined systolic (congestive) and diastolic (congestive) heart failure Wound Cleansing Wound #4 Left,Lateral Foot o Dial antibacterial soap, wash wounds, rinse and pat dry prior to dressing wounds Primary Wound Dressing Wound #4 Left,Lateral Foot o Silver Alginate Secondary Dressing Wound #4 Left,Lateral Foot o ABD and Kerlix/Conform o Other - ace wrap to hold all in place Dressing Change Frequency Wound #4 Left,Lateral Foot o Change dressing every other day. Follow-up Appointments Wound #4 Left,Lateral Foot o Return Appointment in 2 weeks. Off-Loading o Open toe surgical shoe Electronic Signature(s) Signed: 06/12/2020 11:46:44 AM By: Georges Mouse, Minus Breeding Signed: 06/12/2020 4:53:02 PM By: Worthy Keeler PA-C Entered By: Georges Mouse, Minus Breeding on 06/12/2020 11:20:21 [name] ([id number]) -------------------------------------------------------------------------------- Problem List Details Patient Name: Gloria Both A. Date of Service: 06/12/2020 11:00 AM Medical Record Number: [id number] Patient Account Number: [id number] Date of Birth/Sex: [birth year]  (64 y.o. F) Treating RN: Cornell Barman Primary Care Provider: [name] Other Clinician: Referring Provider: [name] Treating Provider/Extender: Skipper Cliche in Treatment: 5 Active Problems ICD-10 Encounter Code Description Active Date MDM Diagnosis E11.621 Type 2 diabetes mellitus with foot ulcer 05/04/2020 No Yes L97.523 Non-pressure chronic ulcer of other part of left foot with necrosis of 05/04/2020 No Yes muscle I10 Essential (primary) hypertension 05/04/2020 No Yes I50.42 Chronic combined systolic (congestive) and diastolic (congestive) heart 05/04/2020 No Yes failure Inactive Problems Resolved Problems Electronic Signature(s) Signed: 06/12/2020 11:00:05 AM By: Worthy Keeler PA-C Entered By: Worthy Keeler on 06/12/2020 11:00:04 [place], Glynnis AMarland Kitchen ([id number]) -------------------------------------------------------------------------------- Progress Note Details Patient Name: Gloria Both A. Date of Service: 06/12/2020 11:00 AM Medical Record Number: [id number] Patient Account Number: [id number] Date of Birth/Sex: 09-14-1955 (64 y.o. F) Treating RN: Cornell Barman Primary Care Provider: [name] Other Clinician: Referring Provider: [name] Treating Provider/Extender: Skipper Cliche in Treatment: 5 Subjective Chief Complaint Information obtained from Patient Left foot surgical ulcer History of Present Illness (HPI) 12/27/2019 upon evaluation today patient appears to be doing somewhat poorly upon initial inspection here in the clinic. She has unfortunately been having issues with for the past week a blister over her right heel that started on the plantar aspect and has spread to the medial aspect. She does have a history of diabetes mellitus type 2 she also has hypertension along with congestive heart failure. With that being said she was supposed to be having knee surgery on her right knee but this was postponed by the surgeon and she was  referred to [place] due to the blister noted. Fortunately there is  no signs of active infection at this time. With that being said I am concerned about the fact that this could develop into infection. Obviously we want to prevent such from happening. She does note that she been placed on a antibiotic for urinary tract infection by her primary care provider she has that to pick up. I asked her to call and let us know what that antibiotic was so that we can put that in the chart. For that reason I am not can give her anything empirically at this point. The patient cannot remember any injury that she had to this region in fact she really does not know how this began at all other than the fact that it "just showed up". 01/05/2020 upon evaluation today patient appears to be doing about the same in regard to her heel ulcer. She has been tolerating the dressing changes without complication. Fortunately there is no signs of active infection at this time. No fevers, chills, nausea, vomiting, or diarrhea. With that being said I do believe the skin is getting need to be removed from the heel where this is somewhat deflated there is still a lot of blood collecting underneath and I think this is not can I do her any good to be perfectly honest. Plus we do not really know exactly what everything looks like underneath as far as the actual wound is concerned. Obviously we need to figure that out. 01/12/2020 upon evaluation today patient's wound actually appears to be doing excellent in fact this is very close to complete resolution. Subsequently I also did send a culture after removing the blood blister last week and the patient did not have any bacteria noted just normal skin flora and no growth otherwise after 2 days. Fortunately there is no signs of active infection at this time systemically or locally. Overall I feel that she is doing excellent. 01/19/2020 upon evaluation today patient appears to be doing excellent at  this time in regard to her heel ulcer for the most part. The one issue that we do see is that she is having a new blistered area that amount to clear away some of the skin from today. This seems to be due to her foot slipping in the offloading shoe that she currently is utilizing. Fortunately there is no signs of active infection at this time. No fevers, chills, nausea, vomiting, or diarrhea. 01/26/2020 upon evaluation today patient's wound actually showed signs of excellent improvement. She has made great progress even since last week's visit. I do believe that she continues to tolerate the dressing changes without any complication and I am very pleased with that as well. In general I think that we will likely continue with the current measures as long as we are continuing to see the results that we are at this point. 02/02/2020 upon evaluation today patient appears to be doing excellent in regard to her wounds currently. She has been tolerating the dressing changes without complication in fact this appears to be completely healed today based on what I am seeing. Readmission: 03/26/2020 on evaluation today patient presents for readmission concerning an issue that she is actually having with her opposite foot compared to the one that I treated last time she was with this back in July when she healed. Subsequently she tells me that she really does not know how this came up. She does not have any particular injury that she is aware of at this point. With that being said she tells me  that the area came up somewhat unexpectedly and has been dark and similar to what she had before. She has had some drainage from it as well. She has been on Keflex initially for urinary tract infection then her doctor actually gave her a second round due to the heel itself in order to try to help with this. Nonetheless she still has erythema and warmth of the foot I am concerned that the Keflex is not the appropriate medication.  Previously we used Augmentin but that is very similar to the Keflex to be honest. I am not really sure if that would be the best option for her at this time. Nonetheless she probably needs something oral while we await the results of the culture and then if we need to make any adjustments or changes we can do so at that point. I do need to keep her kidney function in consideration with this. 05/04/2020 upon evaluation today patient presents for reevaluation here in the clinic after having had surgery as performed by Dr. Vickki Muff. She actually goes back to see him this coming week on October 5. She still has sutures in place at the moment currently he has recommended Betadine to the wound area followed by dry gauze dressing and cleansing with saline. Her surgery was roughly 4 weeks ago. She tells me that she is really not having any significant pain which is great news. Overall I am pleased with where things stand. I do believe that once the sutures are out this may potentially slightly dehisced nonetheless I think that we should be able to hopefully get this to close effectively without much complication. However time will tell we will see what happens once the sutures are indeed removed. 05/11/2020 upon evaluation today patient appears to be doing decently well in regard to her foot ulcer. Fortunately there is no signs of active infection at this time. Actually appears to be healing okay and the sutures have been removed at this point. I do believe talking the alginate into the crease where this is healing will be appropriate to try to keep this area nice and clean and allow it to hopefully continue to heal. Fortunately there is no signs of active infection. 05/29/2020 upon evaluation today patient appears to be doing excellent in regard to her foot ulcer all things considered she does have some necrotic tissue in the central portion of the wound but fortunately this does not seem to be a significant  issue at this point. Overall I am extremely pleased with how the patient in general seems to be progressing. Wickliff, Keairra A. ([id number]) 06/12/2020 upon evaluation today patient appears to be doing well in regard to her foot ulcer all things considered. She was in the hospital due to a lung infection although she tells me she is doing better now in the hospital they did change her dressing and even the surgeon's physician assistant did come by and see her while in the hospital. Overall they are still pleased with where things stand. Nonetheless I still think this can take quite some time to get this wound to completely close. That again was discussed with the patient today although I do feel like she is making progress. Objective Constitutional Well-nourished and well-hydrated in no acute distress. Vitals Time Taken: 10:54 AM, Height: 62 in, Weight: 196 lbs, BMI: 35.8, Temperature: 98.0 F, Pulse: 81 bpm, Respiratory Rate: 18 breaths/min, Blood Pressure: 166/93 mmHg. Respiratory normal breathing without difficulty. Psychiatric this patient is able to make decisions  and demonstrates good insight into disease process. Alert and Oriented x 3. pleasant and cooperative. General Notes: On inspection patient's wound bed actually showed signs of good granulation at this time there was slough noted in the crease where the wound does go somewhat deeper I did perform sharp debridement to clear away some of the necrotic debris here down to fairly good subcutaneous tissue this is excellent news. With that being said I think we need to continue to monitor for any signs of infection or worsening in general if anything does occur then of course we need to address that as necessary. Right now she appears to be doing excellent. Integumentary (Hair, Skin) Wound #4 status is Open. Original cause of wound was Surgical Injury. The wound is located on the Left,Lateral Foot. The wound measures 6cm length x 0.9cm  width x 0.9cm depth; 4.241cm^2 area and 3.817cm^3 volume. There is Fat Layer (Subcutaneous Tissue) exposed. There is no tunneling or undermining noted. There is a small amount of serosanguineous drainage noted. The wound margin is distinct with the outline attached to the wound base. There is medium (34-66%) pink granulation within the wound bed. There is a medium (34-66%) amount of necrotic tissue within the wound bed including Adherent Slough. Assessment Active Problems ICD-10 Type 2 diabetes mellitus with foot ulcer Non-pressure chronic ulcer of other part of left foot with necrosis of muscle Essential (primary) hypertension Chronic combined systolic (congestive) and diastolic (congestive) heart failure Procedures Wound #4 Pre-procedure diagnosis of Wound #4 is a Diabetic Wound/Ulcer of the Lower Extremity located on the Left,Lateral Foot .Severity of Tissue Pre Debridement is: Fat layer exposed. There was a Excisional Skin/Subcutaneous Tissue Debridement with a total area of 5.4 sq cm performed by Tommie Sams., PA-C. With the following instrument(s): Curette to remove Viable and Non-Viable tissue/material. Material removed includes Subcutaneous Tissue and Slough and after achieving pain control using Lidocaine 4% Topical Solution. A time out was conducted at 11:15, prior to the start of the procedure. A Moderate amount of bleeding was controlled with Silver Nitrate. The procedure was tolerated well with a pain level of 0 throughout and a pain level of 0 following the procedure. Post Debridement Measurements: 6cm length x 0.9cm width x 1cm depth; 4.241cm^3 volume. Character of Wound/Ulcer Post Debridement is stable. Severity of Tissue Post Debridement is: Fat layer exposed. Post procedure Diagnosis Wound #4: Same as Pre-Procedure Ault, Macayla A. ([id number]) Plan Wound Cleansing: Wound #4 Left,Lateral Foot: Dial antibacterial soap, wash wounds, rinse and pat dry prior to dressing  wounds Primary Wound Dressing: Wound #4 Left,Lateral Foot: Silver Alginate Secondary Dressing: Wound #4 Left,Lateral Foot: ABD and Kerlix/Conform Other - ace wrap to hold all in place Dressing Change Frequency: Wound #4 Left,Lateral Foot: Change dressing every other day. Follow-up Appointments: Wound #4 Left,Lateral Foot: Return Appointment in 2 weeks. Off-Loading: Open toe surgical shoe 1. Would recommend currently that we continue with the silver alginate dressing I think that still the best way to go. We are packing this down into the wound in order to allow this to granulate in from the bottom outward. 2. I am also can recommend at this time that we have the patient continue to monitor for any signs of worsening infection specifically with regard to any purulent drainage or otherwise. 3. I would recommend as well the patient continue to try to limit her mobility at this time to allow this to heal as quickly as possible she is using open toe surgical shoe at this  point. We will see patient back for reevaluation in 3 weeks here in the clinic. If anything worsens or changes patient will contact our office for additional recommendations. Electronic Signature(s) Signed: 06/12/2020 1:39:56 PM By: Worthy Keeler PA-C Entered By: Worthy Keeler on 06/12/2020 13:39:56 Bessinger, [name] ([id number]) -------------------------------------------------------------------------------- SuperBill Details Patient Name: Gloria Both A. Date of Service: 06/12/2020 Medical Record Number: [id number] Patient Account Number: [id number] Date of Birth/Sex: 12-07-1955 (64 y.o. F) Treating RN: Cornell Barman Primary Care Provider: [name] Other Clinician: Referring Provider: [name] Treating Provider/Extender: Skipper Cliche in Treatment: 5 Diagnosis Coding ICD-10 Codes Code Description E11.621 Type 2 diabetes mellitus with foot ulcer L97.523 Non-pressure chronic ulcer of other part  of left foot with necrosis of muscle I10 Essential (primary) hypertension I50.42 Chronic combined systolic (congestive) and diastolic (congestive) heart failure Facility Procedures CPT4 Code: 07867544 Description: 92010 - DEB SUBQ TISSUE 20 SQ CM/< Modifier: Quantity: 1 CPT4 Code: Description: ICD-10 Diagnosis Description L97.523 Non-pressure chronic ulcer of other part of left foot with necrosis of m Modifier: uscle Quantity: Physician Procedures CPT4 Code: 0712197 Description: 11042 - WC PHYS SUBQ TISS 20 SQ CM Modifier: Quantity: 1 CPT4 Code: Description: ICD-10 Diagnosis Description L97.523 Non-pressure chronic ulcer of other part of left foot with necrosis of m Modifier: uscle Quantity: Electronic Signature(s) Signed: 06/12/2020 1:40:05 PM By: Worthy Keeler PA-C Entered By: Worthy Keeler on 06/12/2020 13:40:04

## 2020-06-12 NOTE — Progress Notes (Signed)
[name], [name] ([id number]) Visit Report for 06/12/2020 Arrival Information Details Patient Name: [name], [name] Date of Service: 06/12/2020 11:00 AM Medical Record Number: [id number] Patient Account Number: [id number] Date of Birth/Sex: [birth year] (64 y.o. F) Treating RN: Dolan Amen Primary Care Moxon Messler: [name] Other Clinician: Referring Samel Bruna: [name] Treating Dajae Kizer/Extender: Skipper Cliche in Treatment: 5 Visit Information History Since Last Visit Pain Present Now: No Patient Arrived: Wheel Chair Arrival Time: 10:53 Accompanied By: Shearon Stalls Transfer Assistance: EasyPivot Patient Lift Patient Identification Verified: Yes Secondary Verification Process Completed: Yes Patient Requires Transmission-Based No Precautions: Patient Has Alerts: No Electronic Signature(s) Signed: 06/12/2020 11:46:44 AM By: Georges Mouse, Minus Breeding Entered By: Georges Mouse, Minus Breeding on 06/12/2020 10:54:36 [name] ([id number]) -------------------------------------------------------------------------------- Encounter Discharge Information Details Patient Name: Gloria Both A. Date of Service: 06/12/2020 11:00 AM Medical Record Number: [id number] Patient Account Number: [id number] Date of Birth/Sex: 07-10-1956 (64 y.o. F) Treating RN: Dolan Amen Primary Care Sayre Mazor: [name] Other Clinician: Referring Jame Seelig: [name] Treating Tremell Reimers/Extender: Skipper Cliche in Treatment: 5 Encounter Discharge Information Items Post Procedure Vitals Discharge Condition: Stable Temperature (F): 98.0 Ambulatory Status: Wheelchair Pulse (bpm): 81 Discharge Destination: Home Respiratory Rate (breaths/min): 18 Transportation: Private Auto Blood Pressure (mmHg): 166/93 Accompanied By: fiance Schedule Follow-up Appointment: Yes Clinical Summary of Care: Electronic Signature(s) Signed: 06/12/2020 11:46:44 AM By: Georges Mouse, Minus Breeding Entered  By: Georges Mouse, Minus Breeding on 06/12/2020 11:22:23 [name] ([id number]) -------------------------------------------------------------------------------- Lower Extremity Assessment Details Patient Name: Gloria Both A. Date of Service: 06/12/2020 11:00 AM Medical Record Number: [id number] Patient Account Number: [id number] Date of Birth/Sex: May 15, 1956 (64 y.o. F) Treating RN: Dolan Amen Primary Care Anberlin Diez: [name] Other Clinician: Referring Evia Goldsmith: [name] Treating Camaron Cammack/Extender: Skipper Cliche in Treatment: 5 Edema Assessment Assessed: [Left: Yes] [Right: No] Edema: [Left: Ye] [Right: s] Calf Left: Right: Point of Measurement: 26 cm From Medial Instep 46 cm Ankle Left: Right: Point of Measurement: 12 cm From Medial Instep 29.8 cm Vascular Assessment Pulses: Dorsalis Pedis Palpable: [Left:Yes] Electronic Signature(s) Signed: 06/12/2020 11:46:44 AM By: Georges Mouse, Minus Breeding Entered By: Georges Mouse, Minus Breeding on 06/12/2020 11:11:53 [name] ([id number]) -------------------------------------------------------------------------------- Multi-Disciplinary Care Plan Details Patient Name: Gloria Both A. Date of Service: 06/12/2020 11:00 AM Medical Record Number: [id number] Patient Account Number: [id number] Date of Birth/Sex: June 06, 1956 (64 y.o. F) Treating RN: Dolan Amen Primary Care Treesa Mccully: [name] Other Clinician: Referring Parris Signer: [name] Treating Cadee Agro/Extender: Skipper Cliche in Treatment: 5 Active Inactive Wound/Skin Impairment Nursing Diagnoses: Impaired tissue integrity Knowledge deficit related to ulceration/compromised skin integrity Goals: Patient/caregiver will verbalize understanding of skin care regimen Date Initiated: 05/04/2020 Target Resolution Date: 05/13/2020 Goal Status: Active Interventions: Assess patient/caregiver ability to obtain necessary supplies Assess  patient/caregiver ability to perform ulcer/skin care regimen upon admission and as needed Provide education on ulcer and skin care Notes: Electronic Signature(s) Signed: 06/12/2020 11:46:44 AM By: Georges Mouse, Minus Breeding Entered By: Georges Mouse, Kenia on 06/12/2020 11:14:30 [name], [name] Kitchen ([id number]) -------------------------------------------------------------------------------- Pain Assessment Details Patient Name: Gloria Both A. Date of Service: 06/12/2020 11:00 AM Medical Record Number: [id number] Patient Account Number: [id number] Date of Birth/Sex: 1956/08/04 (64 y.o. F) Treating RN: Dolan Amen Primary Care Katelind Pytel: [name] Other Clinician: Referring Kiauna Zywicki: [name] Treating Nester Bachus/Extender: Skipper Cliche in Treatment: 5 Active Problems Location of Pain Severity and Description of Pain Patient Has Paino No Site Locations Rate the pain. Current Pain Level: 0 Pain Management and Medication Current Pain Management: Electronic Signature(s) Signed: 06/12/2020 11:46:44 AM By: Laurin Coder  Staci Acosta Entered By: Georges Mouse, Minus Breeding on 06/12/2020 10:57:46 [name], [name] ([id number]) -------------------------------------------------------------------------------- Patient/Caregiver Education Details Patient Name: Gloria Both A. Date of Service: 06/12/2020 11:00 AM Medical Record Number: [id number] Patient Account Number: [id number] Date of Birth/Gender: 1956-01-03 (64 y.o. F) Treating RN: Dolan Amen Primary Care Physician: [name] Other Clinician: Referring Physician: [name] Treating Physician/Extender: Skipper Cliche in Treatment: 5 Education Assessment Education Provided To: Patient Education Topics Provided Wound Debridement: Handouts: Wound Debridement Methods: Explain/Verbal Responses: State content correctly Wound/Skin Impairment: Handouts: Caring for Your Ulcer Methods: Demonstration,  Explain/Verbal Responses: State content correctly Electronic Signature(s) Signed: 06/12/2020 11:46:44 AM By: Georges Mouse, Minus Breeding Entered By: Georges Mouse, Kenia on 06/12/2020 11:21:43 [name], [name] Kitchen ([id number]) -------------------------------------------------------------------------------- Wound Assessment Details Patient Name: Gloria Both A. Date of Service: 06/12/2020 11:00 AM Medical Record Number: [id number] Patient Account Number: [id number] Date of Birth/Sex: [birth year] (64 y.o. F) Treating RN: Dolan Amen Primary Care Worthington Cruzan: [name] Other Clinician: Referring Khalaya Mcgurn: [name] Treating Chelcey Caputo/Extender: Skipper Cliche in Treatment: 5 Wound Status Wound Number: 4 Primary Diabetic Wound/Ulcer of the Lower Extremity Etiology: Wound Location: Left, Lateral Foot Wound Open Wounding Event: Surgical Injury Status: Date Acquired: 04/06/2020 Comorbid Cataracts, Asthma, Congestive Heart Failure, Weeks Of Treatment: 5 History: Hypertension, Type II Diabetes, End Stage Renal Disease, Clustered Wound: No History of pressure wounds, Received Chemotherapy Photos Wound Measurements Length: (cm) 6 Width: (cm) 0.9 Depth: (cm) 0.9 Area: (cm) 4.241 Volume: (cm) 3.817 % Reduction in Area: -881.7% % Reduction in Volume: -8776.7% Epithelialization: Medium (34-66%) Tunneling: No Undermining: No Wound Description Classification: Grade 2 Wound Margin: Distinct, outline attached Exudate Amount: Small Exudate Type: Serosanguineous Exudate Color: red, brown Foul Odor After Cleansing: No Slough/Fibrino Yes Wound Bed Granulation Amount: Medium (34-66%) Exposed Structure Granulation Quality: Pink Fascia Exposed: No Necrotic Amount: Medium (34-66%) Fat Layer (Subcutaneous Tissue) Exposed: Yes Necrotic Quality: Adherent Slough Tendon Exposed: No Muscle Exposed: No Joint Exposed: No Bone Exposed: No Electronic Signature(s) Signed: 06/12/2020  11:46:44 AM By: Georges Mouse, Minus Breeding Entered By: Georges Mouse, Kenia on 06/12/2020 11:07:54 [name], [name] ([id number]) -------------------------------------------------------------------------------- Vitals Details Patient Name: Gloria Both A. Date of Service: 06/12/2020 11:00 AM Medical Record Number: [id number] Patient Account Number: [id number] Date of Birth/Sex: 21-Jan-1956 (64 y.o. F) Treating RN: Dolan Amen Primary Care Taneasha Fuqua: [name] Other Clinician: Referring Thayer Inabinet: [name] Treating Dorsey Authement/Extender: Skipper Cliche in Treatment: 5 Vital Signs Time Taken: 10:54 Temperature (F): 98.0 Height (in): 62 Pulse (bpm): 81 Weight (lbs): 196 Respiratory Rate (breaths/min): 18 Body Mass Index (BMI): 35.8 Blood Pressure (mmHg): 166/93 Reference Range: 80 - 120 mg / dl Electronic Signature(s) Signed: 06/12/2020 11:46:44 AM By: Georges Mouse, Minus Breeding Entered By: Georges Mouse, Minus Breeding on 06/12/2020 10:57:40

## 2020-06-13 DIAGNOSIS — I130 Hypertensive heart and chronic kidney disease with heart failure and stage 1 through stage 4 chronic kidney disease, or unspecified chronic kidney disease: Secondary | ICD-10-CM | POA: Diagnosis not present

## 2020-06-13 DIAGNOSIS — N184 Chronic kidney disease, stage 4 (severe): Secondary | ICD-10-CM | POA: Diagnosis not present

## 2020-06-13 DIAGNOSIS — I5042 Chronic combined systolic (congestive) and diastolic (congestive) heart failure: Secondary | ICD-10-CM | POA: Diagnosis not present

## 2020-06-13 DIAGNOSIS — Z4781 Encounter for orthopedic aftercare following surgical amputation: Secondary | ICD-10-CM | POA: Diagnosis not present

## 2020-06-13 DIAGNOSIS — C50919 Malignant neoplasm of unspecified site of unspecified female breast: Secondary | ICD-10-CM | POA: Diagnosis not present

## 2020-06-13 DIAGNOSIS — E1122 Type 2 diabetes mellitus with diabetic chronic kidney disease: Secondary | ICD-10-CM | POA: Diagnosis not present

## 2020-06-13 DIAGNOSIS — J45909 Unspecified asthma, uncomplicated: Secondary | ICD-10-CM | POA: Diagnosis not present

## 2020-06-13 DIAGNOSIS — E1142 Type 2 diabetes mellitus with diabetic polyneuropathy: Secondary | ICD-10-CM | POA: Diagnosis not present

## 2020-06-13 DIAGNOSIS — I051 Rheumatic mitral insufficiency: Secondary | ICD-10-CM | POA: Diagnosis not present

## 2020-06-14 DIAGNOSIS — R918 Other nonspecific abnormal finding of lung field: Secondary | ICD-10-CM | POA: Diagnosis not present

## 2020-06-14 DIAGNOSIS — J9611 Chronic respiratory failure with hypoxia: Secondary | ICD-10-CM | POA: Diagnosis not present

## 2020-06-14 DIAGNOSIS — E1122 Type 2 diabetes mellitus with diabetic chronic kidney disease: Secondary | ICD-10-CM | POA: Diagnosis not present

## 2020-06-14 DIAGNOSIS — F3340 Major depressive disorder, recurrent, in remission, unspecified: Secondary | ICD-10-CM | POA: Diagnosis not present

## 2020-06-14 DIAGNOSIS — R0902 Hypoxemia: Secondary | ICD-10-CM | POA: Diagnosis not present

## 2020-06-14 DIAGNOSIS — K861 Other chronic pancreatitis: Secondary | ICD-10-CM | POA: Diagnosis not present

## 2020-06-14 DIAGNOSIS — C799 Secondary malignant neoplasm of unspecified site: Secondary | ICD-10-CM | POA: Diagnosis not present

## 2020-06-14 DIAGNOSIS — Z09 Encounter for follow-up examination after completed treatment for conditions other than malignant neoplasm: Secondary | ICD-10-CM | POA: Diagnosis not present

## 2020-06-14 DIAGNOSIS — I132 Hypertensive heart and chronic kidney disease with heart failure and with stage 5 chronic kidney disease, or end stage renal disease: Secondary | ICD-10-CM | POA: Diagnosis not present

## 2020-06-14 DIAGNOSIS — J910 Malignant pleural effusion: Secondary | ICD-10-CM | POA: Diagnosis not present

## 2020-06-14 DIAGNOSIS — I5033 Acute on chronic diastolic (congestive) heart failure: Secondary | ICD-10-CM | POA: Diagnosis not present

## 2020-06-14 DIAGNOSIS — C50212 Malignant neoplasm of upper-inner quadrant of left female breast: Secondary | ICD-10-CM | POA: Diagnosis not present

## 2020-06-14 DIAGNOSIS — J90 Pleural effusion, not elsewhere classified: Secondary | ICD-10-CM | POA: Diagnosis not present

## 2020-06-15 ENCOUNTER — Inpatient Hospital Stay: Payer: Medicare HMO

## 2020-06-15 ENCOUNTER — Inpatient Hospital Stay: Payer: Medicare HMO | Attending: Internal Medicine

## 2020-06-15 ENCOUNTER — Other Ambulatory Visit: Payer: Self-pay

## 2020-06-15 ENCOUNTER — Inpatient Hospital Stay (HOSPITAL_BASED_OUTPATIENT_CLINIC_OR_DEPARTMENT_OTHER): Payer: Medicare HMO | Admitting: Internal Medicine

## 2020-06-15 ENCOUNTER — Encounter: Payer: Self-pay | Admitting: Internal Medicine

## 2020-06-15 DIAGNOSIS — Z7189 Other specified counseling: Secondary | ICD-10-CM

## 2020-06-15 DIAGNOSIS — Z803 Family history of malignant neoplasm of breast: Secondary | ICD-10-CM | POA: Diagnosis not present

## 2020-06-15 DIAGNOSIS — Z8673 Personal history of transient ischemic attack (TIA), and cerebral infarction without residual deficits: Secondary | ICD-10-CM | POA: Diagnosis not present

## 2020-06-15 DIAGNOSIS — C7951 Secondary malignant neoplasm of bone: Secondary | ICD-10-CM | POA: Diagnosis not present

## 2020-06-15 DIAGNOSIS — R5381 Other malaise: Secondary | ICD-10-CM | POA: Insufficient documentation

## 2020-06-15 DIAGNOSIS — Z170 Estrogen receptor positive status [ER+]: Secondary | ICD-10-CM

## 2020-06-15 DIAGNOSIS — Z7982 Long term (current) use of aspirin: Secondary | ICD-10-CM | POA: Insufficient documentation

## 2020-06-15 DIAGNOSIS — Z87891 Personal history of nicotine dependence: Secondary | ICD-10-CM | POA: Insufficient documentation

## 2020-06-15 DIAGNOSIS — I130 Hypertensive heart and chronic kidney disease with heart failure and stage 1 through stage 4 chronic kidney disease, or unspecified chronic kidney disease: Secondary | ICD-10-CM | POA: Insufficient documentation

## 2020-06-15 DIAGNOSIS — Z8041 Family history of malignant neoplasm of ovary: Secondary | ICD-10-CM | POA: Diagnosis not present

## 2020-06-15 DIAGNOSIS — E1122 Type 2 diabetes mellitus with diabetic chronic kidney disease: Secondary | ICD-10-CM | POA: Insufficient documentation

## 2020-06-15 DIAGNOSIS — I509 Heart failure, unspecified: Secondary | ICD-10-CM | POA: Diagnosis not present

## 2020-06-15 DIAGNOSIS — D72829 Elevated white blood cell count, unspecified: Secondary | ICD-10-CM | POA: Insufficient documentation

## 2020-06-15 DIAGNOSIS — C50212 Malignant neoplasm of upper-inner quadrant of left female breast: Secondary | ICD-10-CM

## 2020-06-15 DIAGNOSIS — Z5111 Encounter for antineoplastic chemotherapy: Secondary | ICD-10-CM | POA: Insufficient documentation

## 2020-06-15 DIAGNOSIS — N184 Chronic kidney disease, stage 4 (severe): Secondary | ICD-10-CM | POA: Diagnosis not present

## 2020-06-15 DIAGNOSIS — Z79899 Other long term (current) drug therapy: Secondary | ICD-10-CM | POA: Insufficient documentation

## 2020-06-15 DIAGNOSIS — Z800 Family history of malignant neoplasm of digestive organs: Secondary | ICD-10-CM | POA: Diagnosis not present

## 2020-06-15 DIAGNOSIS — R5383 Other fatigue: Secondary | ICD-10-CM | POA: Insufficient documentation

## 2020-06-15 LAB — COMPREHENSIVE METABOLIC PANEL WITH GFR
ALT: 9 U/L (ref 0–44)
AST: 15 U/L (ref 15–41)
Albumin: 2.7 g/dL — ABNORMAL LOW (ref 3.5–5.0)
Alkaline Phosphatase: 70 U/L (ref 38–126)
Anion gap: 8 (ref 5–15)
BUN: 28 mg/dL — ABNORMAL HIGH (ref 8–23)
CO2: 31 mmol/L (ref 22–32)
Calcium: 8.9 mg/dL (ref 8.9–10.3)
Chloride: 99 mmol/L (ref 98–111)
Creatinine, Ser: 2 mg/dL — ABNORMAL HIGH (ref 0.44–1.00)
GFR, Estimated: 28 mL/min — ABNORMAL LOW
Glucose, Bld: 101 mg/dL — ABNORMAL HIGH (ref 70–99)
Potassium: 4.8 mmol/L (ref 3.5–5.1)
Sodium: 138 mmol/L (ref 135–145)
Total Bilirubin: 0.8 mg/dL (ref 0.3–1.2)
Total Protein: 7.7 g/dL (ref 6.5–8.1)

## 2020-06-15 LAB — CBC WITH DIFFERENTIAL/PLATELET
Abs Immature Granulocytes: 0.34 10*3/uL — ABNORMAL HIGH (ref 0.00–0.07)
Basophils Absolute: 0.1 10*3/uL (ref 0.0–0.1)
Basophils Relative: 0 %
Eosinophils Absolute: 0.1 10*3/uL (ref 0.0–0.5)
Eosinophils Relative: 1 %
HCT: 28.2 % — ABNORMAL LOW (ref 36.0–46.0)
Hemoglobin: 9.1 g/dL — ABNORMAL LOW (ref 12.0–15.0)
Immature Granulocytes: 2 %
Lymphocytes Relative: 5 %
Lymphs Abs: 0.7 10*3/uL (ref 0.7–4.0)
MCH: 29.4 pg (ref 26.0–34.0)
MCHC: 32.3 g/dL (ref 30.0–36.0)
MCV: 91 fL (ref 80.0–100.0)
Monocytes Absolute: 1.5 10*3/uL — ABNORMAL HIGH (ref 0.1–1.0)
Monocytes Relative: 10 %
Neutro Abs: 12.5 10*3/uL — ABNORMAL HIGH (ref 1.7–7.7)
Neutrophils Relative %: 82 %
Platelets: 212 10*3/uL (ref 150–400)
RBC: 3.1 MIL/uL — ABNORMAL LOW (ref 3.87–5.11)
RDW: 16.2 % — ABNORMAL HIGH (ref 11.5–15.5)
WBC: 15.2 10*3/uL — ABNORMAL HIGH (ref 4.0–10.5)
nRBC: 0 % (ref 0.0–0.2)

## 2020-06-15 LAB — SAMPLE TO BLOOD BANK

## 2020-06-15 MED ORDER — SODIUM CHLORIDE 0.9 % IV SOLN
10.0000 mg | Freq: Once | INTRAVENOUS | Status: AC
  Administered 2020-06-15: 10 mg via INTRAVENOUS
  Filled 2020-06-15: qty 10

## 2020-06-15 MED ORDER — HEPARIN SOD (PORK) LOCK FLUSH 100 UNIT/ML IV SOLN
500.0000 [IU] | Freq: Once | INTRAVENOUS | Status: AC | PRN
  Administered 2020-06-15: 500 [IU]
  Filled 2020-06-15: qty 5

## 2020-06-15 MED ORDER — DEXTROSE 5 % IV SOLN
Freq: Once | INTRAVENOUS | Status: AC
  Filled 2020-06-15: qty 250

## 2020-06-15 MED ORDER — HEPARIN SOD (PORK) LOCK FLUSH 100 UNIT/ML IV SOLN
INTRAVENOUS | Status: AC
  Filled 2020-06-15: qty 5

## 2020-06-15 MED ORDER — DOXORUBICIN HCL LIPOSOMAL CHEMO INJECTION 2 MG/ML
15.0000 mg/m2 | Freq: Once | INTRAVENOUS | Status: AC
  Administered 2020-06-15: 30 mg via INTRAVENOUS
  Filled 2020-06-15: qty 15

## 2020-06-15 NOTE — Assessment & Plan Note (Addendum)
#  metastatic stage IV breast cancer ER/PR positive HER-2 negative status post multiple lines of therapy-most recent gemcitabine most recently admitted hospital for worsening shortness of breath/pleural effusion- noted to have progressive disease; October 30 CT scan unfortunately-shows progression of disease-pleural metastases/bilateral effusion; worsening lytic lesions in the bones.   #We will discontinue gemcitabine.  Proceed with weekly Doxil therapy.  Patient understands treatments are again palliative not curative. Discussed the potential side effects including but not limited to-increasing fatigue, nausea vomiting, diarrhea, hair loss, sores in the mouth, increase risk of infection and also neuropathy.  Also discussed the risk of hand-foot reaction; and also cardiomyopathy.  Patient is at high risk of cardiac events given her underlying chronic kidney disease/poorly controlled diabetes/likely vasculopath.  Recent 2D echo August 2021-50 to 55% ejection fraction.  #Clinically malignant bilateral pleural effusion status post thoracentesis [OCT 2021]-cytology negative.  Monitor closely; if worsen consider Pleurx catheter.  # Anemia-hemoglobin-nadir 6.7 s/p PRBC transfusion; currently hemoglobin-9.7- STABLE.  Monitor closely high risk of worsening anemia on chemotherapy.  # Chronic kidney disease - stage IV [GFR-24]- STABLE.   # Bone mets-sclerotic; Right acetabular uptake-s/p radiation; CT scan shows worsening metastases in the vertebrae; currently asymptomatic- STABLE.   #Leukocytosis-15 ?  Reactive versus others.  No signs of active infection.  S/p- s/p antibiotics.  Monitor closely.  # DISPOSITION:  # chemo today #  Follow up in 1 week MD; labs- cbc/cmp;-DOXIL; HOLD tube/possible 1 unit PRBC - Dr.B  # 40 minutes face-to-face with the patient discussing the above plan of care; more than 50% of time spent on prognosis/ natural history; counseling and coordination.

## 2020-06-15 NOTE — Progress Notes (Signed)
[facility] OFFICE PROGRESS NOTE  Patient Care Team: [name], [name], MD as Medical Oncologist (Medical Oncology) Corey Skains, MD as Consulting Physician (Cardiology)  Cancer Staging No matching staging information was found for the patient.   Oncology History Overview Note  # OCT 2015-STAGE IV LEFT BREAST T2N1 [T=4cm; [name]; [name]; [name]; EBUS- Positive Paratrac/subcarinal LN s/p ? Taxotere [in [place]; [name]; [name];[name]; [name]; suv 3]; [name]; [name];[name];  # ? Bony lesions- PET sep 2016-non-hypermetabolic sclerotic lesions T10; Ant R iliac bone; inferior sternum- not on X-geva  # April 2019- PET scan Progression/pleural based lesions effusion/bone lesions.   # TAXOL   # SEP [id number]- July 2021- ERIBULIN; Progression  # SEP-OCT 2021- GEMCITABINE [interrupted because of hospitalization; progressive disease; discontinued]; #September 2021-left foot osteomyelitis s/p metatarsal amputation [Dr.Fowler/ Dr. Ola Spurr  #October 30th 2021 [hospitalization]-CT scan progressive pleural lesion bilateral pleural effusion/bone lesions; cytology negative status post thoracentesis x2; hemoglobin 6.5 s/p PRBC transfusion Discontinue gemcitabine  # NOV 11th 2021-Doxil weekly.  # Poorly controlled Blood sugars- improved.   # Pancreatitis Hx/PEI- on creon in past / CKD IV [creat ~ 3-4; [name]; [name]; mild left sided weakness]  # Jan 2020-  Lobular lesion on tongue- s/p excision pyogenic granuloma [Dr.McQueen]   # GENETIC TESTING/COUNSELLING: HETEROZYGOUS Cystic Fibrosis Gene [explains hx of recurrent  pancreatitis]  # MOLECULAR TESTING: NA   # PALLIATIVE CARE: 1/22-Discussed/Declined ------------------------------------------------   DIAGNOSIS: [ 2015] BREAST CA; [name]; [name] ;GOALS: Palliative  CURRENT/MOST RECENT THERAPY: DOXIL [C].     Carcinoma of upper-inner quadrant of left breast in female, estrogen receptor positive ([place])  04/29/2019 - 02/24/2020 Chemotherapy   The patient had eriBULin mesylate (HALAVEN) 2 mg in sodium chloride 0.9 % 100 mL chemo infusion, 2 mg, Intravenous,  Once, 12 of 14 cycles Dose modification: 1 mg/m2 (original dose 1 mg/m2, Cycle 1, Reason: Provider Judgment) Administration: 2 mg (06/03/2019), 2 mg (04/29/2019), 2 mg (06/10/2019), 2 mg (07/04/2019), 2 mg (07/11/2019), 2 mg (07/25/2019), 2 mg (08/03/2019), 2 mg (08/19/2019), 2 mg (08/26/2019), 2 mg (09/09/2019), 2 mg (09/16/2019), 2 mg (10/07/2019), 2 mg (10/14/2019), 2 mg (10/28/2019), 2 mg (11/04/2019), 2 mg (11/28/2019), 2 mg (12/09/2019), 2 mg (01/13/2020), 2 mg (01/20/2020), 2 mg (02/02/2020), 2 mg (02/13/2020), 2 mg (02/24/2020)  for chemotherapy treatment.    04/30/2020 - 05/28/2020 Chemotherapy   The patient had gemcitabine (GEMZAR) 1,600 mg in sodium chloride 0.9 % 250 mL chemo infusion, 1,596 mg, Intravenous,  Once, 2 of 5 cycles Administration: 1,600 mg (04/30/2020), 1,600 mg (05/07/2020), 1,600 mg (05/28/2020)  for chemotherapy treatment.    06/15/2020 -  Chemotherapy   The patient had DOXOrubicin HCL LIPOSOMAL (DOXIL) 30 mg in dextrose 5 % 250 mL chemo infusion, 15 mg/m2 = 30 mg, Intravenous,  Once, 1 of 7 cycles Administration: 30 mg (06/15/2020)  for chemotherapy treatment.     INTERVAL HISTORY:  [name] 64 y.o.  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  Patient  most recently admitted hospital for worsening shortness of breath/pleural effusion- noted  to have  progressive disease. ..   S/p evaluation with CXR with PCP-  Patient is currently s/p cycle #1 of gemcitabine approximately 3 weeks ago. Held last week because of anemia- Hb- 7.5; s/p PRBC transfusion. S/p evaluation with neprhology; and PCP. S/p flu shot. Awaiting to see the Dr.Fowler; podiatry.  No nausea no vomiting.  No diarrhea.  No fevers or chills.   Review of Systems  Constitutional: Positive for malaise/fatigue. Negative for chills, diaphoresis, [name], hemoptysis, sputum production, [name], palpitations and orthopnea.  Gastrointestinal: Negative for abdominal pain, blood in stool, constipation, diarrhea, heartburn, melena, nausea and vomiting.  Genitourinary: Negative for dysuria, frequency and urgency.  Musculoskeletal: Positive for back pain and joint pain.  Skin: Negative.  Negative for itching and rash.  Neurological: Positive for tingling. Negative for dizziness, focal weakness, weakness and headaches.  Endo/Heme/Allergies: Does not bruise/bleed easily.  Psychiatric/Behavioral: Negative for depression. The patient is not nervous/anxious and does not have insomnia.     PAST MEDICAL HISTORY :  Past Medical History:  Diagnosis Date  . Anemia   . Anxiety   . Asthma   . Cancer ([place]) 03/10/2018   Per NM PET order. Carcinoma of upper-inner quadrant of left breast in female, estrogen receptor positive .  Marland Kitchen Cancer (HCC)    LUNG  . CHF (congestive heart failure) ([place]) 1997  . CKD (chronic kidney disease)   . Depression   . Diabetes mellitus, type 2 ([place])   . Family history of breast cancer   . Family history of colon cancer   . Family history of ovarian cancer   . Family history of pancreatic cancer   . Family history of prostate cancer   . Family history of stomach  cancer   . GERD (gastroesophageal reflux disease)    history of an ulcer  . Hair loss   . History of left breast cancer 05/29/14  . History of partial hysterectomy 12/31/2016   Per patient.  Has not had a period in years.  Had a partial hysterectomy years ago.  Marland Kitchen Hypertension   . Mitral valve regurgitation   . Neuromuscular disorder (HCC)    neuropathies in hand  . Obesity   . Pancreatitis 1997  . Stroke [facility] 2010   with mild left arm weakness    PAST SURGICAL HISTORY :   Past Surgical History:  Procedure Laterality Date  . AMPUTATION Left 03/30/2020   Procedure: AMPUTATION 5th RAY;  Surgeon: Samara Deist, DPM;  Location: [facility] ORS;  Service: Podiatry;  Laterality: Left;  . APPLICATION OF WOUND VAC Left 03/30/2020   Procedure: APPLICATION OF WOUND VAC;  Surgeon: Samara Deist, DPM;  Location: [facility] OR[facility];  Service: Podiatry;  Laterality: Left;  . CATARACT EXTRACTION W/PHACO Right 02/24/2019   Procedure: CATARACT EXTRACTION PHACO AND INTRAOCULAR LENS PLACEMENT ([place]) RIGHT DIABETES;  Surgeon: Marchia Meiers, MD;  Location: [facility] ORS;  Service: Ophthalmology;  Laterality: Right;  [place] 01:13.0 CDE 7.96 Fluid Pack Lot # [id number]  . CATARACT EXTRACTION W/PHACO Left 03/24/2019   Procedure: CATARACT EXTRACTION PHACO AND INTRAOCULAR LENS PLACEMENT ([facility]) - left diabetic;  Surgeon: Marchia Meiers, MD;  Location: [facility] OR[facility];  Service: Ophthalmology;  Laterality: Left;  [place]  01:36 CDE 13.93 Fluid pack lot # [id number]  . CENTRAL LINE INSERTION-TUNNELED N/A 04/04/2020   Procedure:  CENTRAL LINE INSERTION-TUNNELED;  Surgeon: Delana Meyer Dolores Lory, MD;  Location: [facility];  Service: Cardiovascular;  Laterality: N/A;  . CESAREAN SECTION    . CHOLECYSTECTOMY    . DIALYSIS/PERMA CATHETER REMOVAL N/A 05/01/2020   Procedure: DIALYSIS/PERMA CATHETER REMOVAL;  Surgeon: Katha Cabal, MD;  Location: [facility];  Service: Cardiovascular;  Laterality: N/A;  . EXCISION OF TONGUE LESION N/A 08/17/2018    Procedure: EXCISION OF TONGUE LESION WITH FROZEN SECTIONS;  Surgeon: Beverly Gust, MD;  Location: [facility] OR[facility];  Service: ENT;  Laterality: N/A;  . EYE SURGERY Right    cataract extraction  . IRRIGATION AND DEBRIDEMENT FOOT Left 03/30/2020   Procedure: IRRIGATION AND DEBRIDEMENT FOOT;  Surgeon: Samara Deist, DPM;  Location: [facility] OR[facility];  Service: Podiatry;  Laterality: Left;  . PARTIAL HYSTERECTOMY  12/31/2016   Per patient, she has not had a period in years since she had a partial hysterectomy.  Marland Kitchen PORTA CATH INSERTION    . TUBAL LIGATION      FAMILY HISTORY :   Family History  Problem Relation Age of Onset  . Ovarian cancer Mother 51  . Diabetes Mother   . Hypertension Mother   . COPD Father   . Hypertension Father   . Colon cancer Father 25  . Diabetes Sister   . Breast cancer Sister 80       bilateral  . Diabetes Brother   . Leukemia Maternal Aunt   . Pancreatic cancer Paternal Aunt 11  . Pancreatic cancer Paternal Uncle   . Colon cancer Paternal Uncle   . Stomach cancer Maternal Grandfather 5  . Throat cancer Paternal Grandmother   . Breast cancer Maternal Aunt 80  . Colon cancer Maternal Aunt   . Bone cancer Maternal Aunt   . Breast cancer Paternal Aunt        dx >50  . Prostate cancer Paternal Uncle   . Pancreatic cancer Paternal Uncle   . Throat cancer Paternal Uncle   . Lung cancer Paternal Uncle   . Stomach cancer Paternal Uncle   . Brain cancer Paternal Aunt   . Cancer Cousin        liver, kidney  . Prostate cancer Cousin        meastatic  . Lung cancer Other     SOCIAL HISTORY:   Social History   Tobacco Use  . Smoking status: Former Smoker    Packs/day: 0.50    Years: 1.00    Pack years: 0.50    Types: Cigarettes  . Smokeless tobacco: Never Used  Vaping Use  . Vaping Use: Never used  Substance Use Topics  . Alcohol use: No    Alcohol/week: 0.0 standard drinks  . Drug use: No    ALLERGIES:  is allergic to fish-derived products,  sulfamethoxazole-trimethoprim, and chlorhexidine.  MEDICATIONS:  Current Outpatient Medications  Medication Sig Dispense Refill  . acetaminophen-codeine (TYLENOL #4) 300-60 MG tablet Take 1 tablet by mouth in the morning and at bedtime.     Marland Kitchen albuterol (PROAIR HFA) 108 (90 BASE) MCG/ACT inhaler Inhale 2 puffs into the lungs every 6 (six) hours as needed for wheezing or shortness of breath.     . ALPRAZolam (XANAX) 0.5 MG tablet Take 0.5 mg by mouth 2 (two) times daily as needed for anxiety or sleep.     Marland Kitchen amLODipine (NORVASC) 10 MG tablet Take 10 mg by mouth daily.     Marland Kitchen aspirin EC 81 MG tablet Take 81  mg by mouth daily.     Marland Kitchen atenolol (TENORMIN) 50 MG tablet Take 50 mg by mouth 2 (two) times daily.     . bumetanide (BUMEX) 1 MG tablet Take 1 tablet (1 mg total) by mouth 2 (two) times daily. 60 tablet 0  . calcitRIOL (ROCALTROL) 0.25 MCG capsule Take 0.25 mcg by mouth daily.     . Cinnamon 500 MG capsule Take 500 mg by mouth 2 (two) times daily.     . cloNIDine (CATAPRES) 0.2 MG tablet Take 0.2 mg by mouth 2 (two) times daily.     Marland Kitchen docusate sodium (COLACE) 100 MG capsule Take 1 capsule (100 mg total) by mouth 2 (two) times daily. (Patient taking differently: Take 100 mg by mouth daily. ) 10 capsule 0  . enalapril (VASOTEC) 10 MG tablet Take 20 mg by mouth 2 (two) times a day.     . famotidine (PEPCID) 20 MG tablet Take 20 mg by mouth 2 (two) times daily.     . ferrous sulfate 325 (65 FE) MG tablet Take 325 mg by mouth daily with breakfast.    . FLUoxetine (PROZAC) 20 MG capsule Take 20 mg by mouth 2 (two) times daily.     . Fluticasone Propionate, Inhal, (FLOVENT DISKUS) 100 MCG/BLIST AEPB Inhale 2 puffs into the lungs in the morning and at bedtime.    . gabapentin (NEURONTIN) 100 MG capsule TAKE 1 CAPSULE(100 MG) BY MOUTH AT BEDTIME (Patient taking differently: Take 100 mg by mouth at bedtime. ) 30 capsule 6  . glyBURIDE (DIABETA) 5 MG tablet Take 10 mg by mouth 2 (two) times daily with a  meal.     . LEVEMIR FLEXTOUCH 100 UNIT/ML FlexPen Inject 25 Units into the skin daily. 15 mL 11  . NOVOLOG FLEXPEN 100 UNIT/ML FlexPen Inject 5 Units into the skin 3 (three) times daily with meals. (Patient taking differently: Inject 7 Units into the skin 2 (two) times daily with a meal. ) 15 mL 11  . senna (SENOKOT) 8.6 MG TABS tablet Take 1 tablet (8.6 mg total) by mouth 2 (two) times daily as needed for mild constipation or moderate constipation. 120 tablet 0  . simvastatin (ZOCOR) 20 MG tablet Take 20 mg by mouth every evening.      No current facility-administered medications for this visit.   Facility-Administered Medications Ordered in Other Visits  Medication Dose Route Frequency Provider Last Rate Last Admin  . sodium chloride flush (NS) 0.9 % injection 10 mL  10 mL Intravenous PRN Cammie Sickle, MD   10 mL at 01/30/16 1054    PHYSICAL EXAMINATION: ECOG PERFORMANCE STATUS: 1 - Symptomatic but completely ambulatory  BP (!) 152/96 (BP Location: Right Arm, Patient Position: Sitting, Cuff Size: Normal)   Pulse 78   Temp (!) 97.4 F (36.3 C) (Tympanic)   Resp 16   Ht _0  (1.575 m)   Wt 178 lb (80.7 kg)   SpO2 100%   BMI 32.56 kg/m   Filed Weights   06/15/20 0847  Weight: 178 lb (80.7 kg)    Physical Exam Constitutional:      Comments: She is alone.  HENT:     Head: Normocephalic and atraumatic.     Mouth/Throat:     Pharynx: No oropharyngeal exudate.  Eyes:     Pupils: Pupils are equal, round, and reactive to light.  Cardiovascular:     Rate and Rhythm: Normal rate and regular rhythm.  Pulmonary:  Effort: No respiratory distress.     Breath sounds: Normal breath sounds. No wheezing.  Abdominal:     General: Bowel sounds are normal. There is no distension.     Palpations: Abdomen is soft. There is no mass.     Tenderness: There is no abdominal tenderness. There is no guarding or rebound.  Musculoskeletal:        General: No tenderness. Normal range  of motion.     Cervical back: Normal range of motion and neck supple.  Skin:    General: Skin is warm.     Comments: Left sole-ulceration noted/see picture below.  Neurological:     Mental Status: She is alert and oriented to person, place, and time.  Psychiatric:        Mood and Affect: Affect normal.     LABORATORY DATA:  I have reviewed the data as listed    Component Value Date/Time   NA 138 06/15/2020 0807   NA 130 (L) 06/06/2014 1102   K 4.8 06/15/2020 0807   K 3.9 06/06/2014 1102   CL 99 06/15/2020 0807   CL 95 (L) 06/06/2014 1102   CO2 31 06/15/2020 0807   CO2 28 06/06/2014 1102   GLUCOSE 101 (H) 06/15/2020 0807   GLUCOSE 349 (H) 06/06/2014 1102   BUN 28 (H) 06/15/2020 0807   BUN 17 06/06/2014 1102   CREATININE 2.00 (H) 06/15/2020 0807   CREATININE 1.63 (H) 06/06/2014 1102   CALCIUM 8.9 06/15/2020 0807   CALCIUM 9.2 06/06/2014 1102   PROT 7.7 06/15/2020 0807   PROT 8.2 06/06/2014 1102   ALBUMIN 2.7 (L) 06/15/2020 0807   ALBUMIN 3.3 (L) 06/06/2014 1102   AST 15 06/15/2020 0807   AST 7 (L) 06/06/2014 1102   ALT 9 06/15/2020 0807   ALT 12 (L) 06/06/2014 1102   ALKPHOS 70 06/15/2020 0807   ALKPHOS 74 06/06/2014 1102   BILITOT 0.8 06/15/2020 0807   BILITOT 0.4 06/06/2014 1102   GFRNONAA 28 (L) 06/15/2020 0807   GFRNONAA 35 (L) 06/06/2014 1102   GFRAA 24 (L) 05/07/2020 0807   GFRAA 42 (L) 06/06/2014 1102    No results found for: SPEP, UPEP  Lab Results  Component Value Date   WBC 15.2 (H) 06/15/2020   NEUTROABS 12.5 (H) 06/15/2020   HGB 9.1 (L) 06/15/2020   HCT 28.2 (L) 06/15/2020   MCV 91.0 06/15/2020   PLT 212 06/15/2020      Chemistry      Component Value Date/Time   NA 138 06/15/2020 0807   NA 130 (L) 06/06/2014 1102   K 4.8 06/15/2020 0807   K 3.9 06/06/2014 1102   CL 99 06/15/2020 0807   CL 95 (L) 06/06/2014 1102   CO2 31 06/15/2020 0807   CO2 28 06/06/2014 1102   BUN 28 (H) 06/15/2020 0807   BUN 17 06/06/2014 1102   CREATININE 2.00  (H) 06/15/2020 0807   CREATININE 1.63 (H) 06/06/2014 1102      Component Value Date/Time   CALCIUM 8.9 06/15/2020 0807   CALCIUM 9.2 06/06/2014 1102   ALKPHOS 70 06/15/2020 0807   ALKPHOS 74 06/06/2014 1102   AST 15 06/15/2020 0807   AST 7 (L) 06/06/2014 1102   ALT 9 06/15/2020 0807   ALT 12 (L) 06/06/2014 1102   BILITOT 0.8 06/15/2020 0807   BILITOT 0.4 06/06/2014 1102         RADIOGRAPHIC STUDIES: I have personally reviewed the radiological images as listed and agreed with  the findings in the report. No results found.   ASSESSMENT & PLAN:  Carcinoma of upper-inner quadrant of left breast in female, estrogen receptor positive ([place]) # metastatic stage IV breast cancer ER/PR positive HER-2 negative status post multiple lines of therapy-most recent gemcitabine most recently admitted hospital for worsening shortness of breath/pleural effusion- noted to have progressive disease; October 30 CT scan unfortunately-shows progression of disease-pleural metastases/bilateral effusion; worsening lytic lesions in the bones.   #We will discontinue gemcitabine.  Proceed with weekly Doxil therapy.  Patient understands treatments are again palliative not curative. Discussed the potential side effects including but not limited to-increasing fatigue, nausea vomiting, diarrhea, hair loss, sores in the mouth, increase risk of infection and also neuropathy.  Also discussed the risk of hand-foot reaction; and also cardiomyopathy.  Patient is at high risk of cardiac events given her underlying chronic kidney disease/poorly controlled diabetes/likely vasculopath.  Recent 2D echo August 2021-50 to 55% ejection fraction.  #Clinically malignant bilateral pleural effusion status post thoracentesis [OCT 2021]-cytology negative.  Monitor closely; if worsen consider Pleurx catheter.  # Anemia-hemoglobin-nadir 6.7 s/p PRBC transfusion; currently hemoglobin-9.7- STABLE.  Monitor closely high risk of worsening anemia  on chemotherapy.  # Chronic kidney disease - stage IV [GFR-24]- STABLE.   # Bone mets-sclerotic; Right acetabular uptake-s/p radiation; CT scan shows worsening metastases in the vertebrae; currently asymptomatic- STABLE.   #Leukocytosis-15 ?  Reactive versus others.  No signs of active infection.  S/p- s/p antibiotics.  Monitor closely.  # DISPOSITION:  # chemo today #  Follow up in 1 week MD; labs- cbc/cmp;-DOXIL; HOLD tube/possible 1 unit PRBC - Dr.B  # 40 minutes face-to-face with the patient discussing the above plan of care; more than 50% of time spent on prognosis/ natural history; counseling and coordination.    Orders Placed This Encounter  Procedures  . Comprehensive metabolic panel    Standing Status:   Future    Standing Expiration Date:   06/15/2021   All questions were answered. The patient knows to call the clinic with any problems, questions or concerns.      Cammie Sickle, MD 06/17/2020 7:29 AM

## 2020-06-15 NOTE — Progress Notes (Signed)
[name] can receive treatment today with a creatinine of 2.0.  Patient tolerated first dose of Doxil today without any complications.

## 2020-06-16 LAB — CEA: CEA: 31.2 ng/mL — ABNORMAL HIGH (ref 0.0–4.7)

## 2020-06-16 LAB — CANCER ANTIGEN 15-3: CA 15-3: 218 U/mL — ABNORMAL HIGH (ref 0.0–25.0)

## 2020-06-16 LAB — CANCER ANTIGEN 27.29: CA 27.29: 291.1 U/mL — ABNORMAL HIGH (ref 0.0–38.6)

## 2020-06-18 ENCOUNTER — Telehealth: Payer: Self-pay

## 2020-06-18 DIAGNOSIS — I5042 Chronic combined systolic (congestive) and diastolic (congestive) heart failure: Secondary | ICD-10-CM | POA: Diagnosis not present

## 2020-06-18 DIAGNOSIS — J45909 Unspecified asthma, uncomplicated: Secondary | ICD-10-CM | POA: Diagnosis not present

## 2020-06-18 DIAGNOSIS — E1122 Type 2 diabetes mellitus with diabetic chronic kidney disease: Secondary | ICD-10-CM | POA: Diagnosis not present

## 2020-06-18 DIAGNOSIS — I051 Rheumatic mitral insufficiency: Secondary | ICD-10-CM | POA: Diagnosis not present

## 2020-06-18 DIAGNOSIS — Z4781 Encounter for orthopedic aftercare following surgical amputation: Secondary | ICD-10-CM | POA: Diagnosis not present

## 2020-06-18 DIAGNOSIS — N184 Chronic kidney disease, stage 4 (severe): Secondary | ICD-10-CM | POA: Diagnosis not present

## 2020-06-18 DIAGNOSIS — E1142 Type 2 diabetes mellitus with diabetic polyneuropathy: Secondary | ICD-10-CM | POA: Diagnosis not present

## 2020-06-18 DIAGNOSIS — I130 Hypertensive heart and chronic kidney disease with heart failure and stage 1 through stage 4 chronic kidney disease, or unspecified chronic kidney disease: Secondary | ICD-10-CM | POA: Diagnosis not present

## 2020-06-18 DIAGNOSIS — C50919 Malignant neoplasm of unspecified site of unspecified female breast: Secondary | ICD-10-CM | POA: Diagnosis not present

## 2020-06-18 NOTE — Telephone Encounter (Signed)
-----   Message from Cammie Sickle, MD sent at 06/17/2020  7:35 AM EST ----- Regarding: foundation one H/T- Please check with pathology if we have the tissue from 2015/ breast biopsy to send for foundation one.  Thanks GB

## 2020-06-18 NOTE — Telephone Encounter (Signed)
Called pathology and spoke with Levada Dy in regards to message below. She stated that will take a day to find out if there is enough tissue for foundation one since resources to find this information out are external. Will call me back directly as soon as she finds out.

## 2020-06-19 DIAGNOSIS — M86172 Other acute osteomyelitis, left ankle and foot: Secondary | ICD-10-CM | POA: Diagnosis not present

## 2020-06-19 NOTE — Telephone Encounter (Signed)
Foundation one submitted for patient.

## 2020-06-19 NOTE — Telephone Encounter (Signed)
Pt does have enough tissue from 2015 specimen to do foundation one. Will submit foundation information today.

## 2020-06-20 DIAGNOSIS — I051 Rheumatic mitral insufficiency: Secondary | ICD-10-CM | POA: Diagnosis not present

## 2020-06-20 DIAGNOSIS — E1122 Type 2 diabetes mellitus with diabetic chronic kidney disease: Secondary | ICD-10-CM | POA: Diagnosis not present

## 2020-06-20 DIAGNOSIS — Z4781 Encounter for orthopedic aftercare following surgical amputation: Secondary | ICD-10-CM | POA: Diagnosis not present

## 2020-06-20 DIAGNOSIS — I5042 Chronic combined systolic (congestive) and diastolic (congestive) heart failure: Secondary | ICD-10-CM | POA: Diagnosis not present

## 2020-06-20 DIAGNOSIS — N184 Chronic kidney disease, stage 4 (severe): Secondary | ICD-10-CM | POA: Diagnosis not present

## 2020-06-20 DIAGNOSIS — I130 Hypertensive heart and chronic kidney disease with heart failure and stage 1 through stage 4 chronic kidney disease, or unspecified chronic kidney disease: Secondary | ICD-10-CM | POA: Diagnosis not present

## 2020-06-20 DIAGNOSIS — C50919 Malignant neoplasm of unspecified site of unspecified female breast: Secondary | ICD-10-CM | POA: Diagnosis not present

## 2020-06-21 DIAGNOSIS — I120 Hypertensive chronic kidney disease with stage 5 chronic kidney disease or end stage renal disease: Secondary | ICD-10-CM | POA: Diagnosis not present

## 2020-06-21 DIAGNOSIS — D631 Anemia in chronic kidney disease: Secondary | ICD-10-CM | POA: Diagnosis not present

## 2020-06-21 DIAGNOSIS — N185 Chronic kidney disease, stage 5: Secondary | ICD-10-CM | POA: Diagnosis not present

## 2020-06-21 DIAGNOSIS — R809 Proteinuria, unspecified: Secondary | ICD-10-CM | POA: Diagnosis not present

## 2020-06-21 DIAGNOSIS — E871 Hypo-osmolality and hyponatremia: Secondary | ICD-10-CM | POA: Diagnosis not present

## 2020-06-21 DIAGNOSIS — N2581 Secondary hyperparathyroidism of renal origin: Secondary | ICD-10-CM | POA: Diagnosis not present

## 2020-06-21 DIAGNOSIS — E1122 Type 2 diabetes mellitus with diabetic chronic kidney disease: Secondary | ICD-10-CM | POA: Diagnosis not present

## 2020-06-22 ENCOUNTER — Other Ambulatory Visit: Payer: Self-pay

## 2020-06-22 ENCOUNTER — Inpatient Hospital Stay (HOSPITAL_BASED_OUTPATIENT_CLINIC_OR_DEPARTMENT_OTHER): Payer: Medicare HMO | Admitting: Internal Medicine

## 2020-06-22 ENCOUNTER — Inpatient Hospital Stay: Payer: Medicare HMO

## 2020-06-22 ENCOUNTER — Telehealth: Payer: Self-pay | Admitting: *Deleted

## 2020-06-22 ENCOUNTER — Encounter: Payer: Self-pay | Admitting: Internal Medicine

## 2020-06-22 DIAGNOSIS — Z5111 Encounter for antineoplastic chemotherapy: Secondary | ICD-10-CM | POA: Diagnosis not present

## 2020-06-22 DIAGNOSIS — D649 Anemia, unspecified: Secondary | ICD-10-CM

## 2020-06-22 DIAGNOSIS — Z7189 Other specified counseling: Secondary | ICD-10-CM

## 2020-06-22 DIAGNOSIS — Z170 Estrogen receptor positive status [ER+]: Secondary | ICD-10-CM | POA: Diagnosis not present

## 2020-06-22 DIAGNOSIS — C7951 Secondary malignant neoplasm of bone: Secondary | ICD-10-CM | POA: Diagnosis not present

## 2020-06-22 DIAGNOSIS — C50212 Malignant neoplasm of upper-inner quadrant of left female breast: Secondary | ICD-10-CM | POA: Diagnosis not present

## 2020-06-22 DIAGNOSIS — I130 Hypertensive heart and chronic kidney disease with heart failure and stage 1 through stage 4 chronic kidney disease, or unspecified chronic kidney disease: Secondary | ICD-10-CM | POA: Diagnosis not present

## 2020-06-22 DIAGNOSIS — I509 Heart failure, unspecified: Secondary | ICD-10-CM | POA: Diagnosis not present

## 2020-06-22 DIAGNOSIS — D72829 Elevated white blood cell count, unspecified: Secondary | ICD-10-CM | POA: Diagnosis not present

## 2020-06-22 DIAGNOSIS — R5383 Other fatigue: Secondary | ICD-10-CM | POA: Diagnosis not present

## 2020-06-22 DIAGNOSIS — R5381 Other malaise: Secondary | ICD-10-CM | POA: Diagnosis not present

## 2020-06-22 LAB — CBC WITH DIFFERENTIAL/PLATELET
Abs Immature Granulocytes: 0.2 10*3/uL — ABNORMAL HIGH (ref 0.00–0.07)
Basophils Absolute: 0 10*3/uL (ref 0.0–0.1)
Basophils Relative: 0 %
Eosinophils Absolute: 0.4 10*3/uL (ref 0.0–0.5)
Eosinophils Relative: 3 %
HCT: 25.4 % — ABNORMAL LOW (ref 36.0–46.0)
Hemoglobin: 8.3 g/dL — ABNORMAL LOW (ref 12.0–15.0)
Immature Granulocytes: 2 %
Lymphocytes Relative: 8 %
Lymphs Abs: 0.9 10*3/uL (ref 0.7–4.0)
MCH: 29.6 pg (ref 26.0–34.0)
MCHC: 32.7 g/dL (ref 30.0–36.0)
MCV: 90.7 fL (ref 80.0–100.0)
Monocytes Absolute: 1.4 10*3/uL — ABNORMAL HIGH (ref 0.1–1.0)
Monocytes Relative: 12 %
Neutro Abs: 8.2 10*3/uL — ABNORMAL HIGH (ref 1.7–7.7)
Neutrophils Relative %: 75 %
Platelets: 387 10*3/uL (ref 150–400)
RBC: 2.8 MIL/uL — ABNORMAL LOW (ref 3.87–5.11)
RDW: 16.6 % — ABNORMAL HIGH (ref 11.5–15.5)
WBC: 11 10*3/uL — ABNORMAL HIGH (ref 4.0–10.5)
nRBC: 0 % (ref 0.0–0.2)

## 2020-06-22 LAB — COMPREHENSIVE METABOLIC PANEL
ALT: 9 U/L (ref 0–44)
AST: 16 U/L (ref 15–41)
Albumin: 2.6 g/dL — ABNORMAL LOW (ref 3.5–5.0)
Alkaline Phosphatase: 78 U/L (ref 38–126)
Anion gap: 9 (ref 5–15)
BUN: 31 mg/dL — ABNORMAL HIGH (ref 8–23)
CO2: 32 mmol/L (ref 22–32)
Calcium: 9.1 mg/dL (ref 8.9–10.3)
Chloride: 96 mmol/L — ABNORMAL LOW (ref 98–111)
Creatinine, Ser: 2.23 mg/dL — ABNORMAL HIGH (ref 0.44–1.00)
GFR, Estimated: 24 mL/min — ABNORMAL LOW (ref >60)
Glucose, Bld: 97 mg/dL (ref 70–99)
Potassium: 4.2 mmol/L (ref 3.5–5.1)
Sodium: 137 mmol/L (ref 135–145)
Total Bilirubin: 0.5 mg/dL (ref 0.3–1.2)
Total Protein: 7.5 g/dL (ref 6.5–8.1)

## 2020-06-22 LAB — SAMPLE TO BLOOD BANK

## 2020-06-22 MED ORDER — DEXTROSE 5 % IV SOLN
Freq: Once | INTRAVENOUS | Status: AC
  Filled 2020-06-22: qty 250

## 2020-06-22 MED ORDER — DOXORUBICIN HCL LIPOSOMAL CHEMO INJECTION 2 MG/ML
15.0000 mg/m2 | Freq: Once | INTRAVENOUS | Status: AC
  Administered 2020-06-22: 30 mg via INTRAVENOUS
  Filled 2020-06-22: qty 15

## 2020-06-22 MED ORDER — HEPARIN SOD (PORK) LOCK FLUSH 100 UNIT/ML IV SOLN
INTRAVENOUS | Status: AC
  Filled 2020-06-22: qty 5

## 2020-06-22 MED ORDER — SODIUM CHLORIDE 0.9 % IV SOLN
10.0000 mg | Freq: Once | INTRAVENOUS | Status: AC
  Administered 2020-06-22: 10 mg via INTRAVENOUS
  Filled 2020-06-22: qty 10

## 2020-06-22 MED ORDER — HEPARIN SOD (PORK) LOCK FLUSH 100 UNIT/ML IV SOLN
500.0000 [IU] | Freq: Once | INTRAVENOUS | Status: AC
  Administered 2020-06-22: 500 [IU] via INTRAVENOUS
  Filled 2020-06-22: qty 5

## 2020-06-22 MED ORDER — SODIUM CHLORIDE 0.9% FLUSH
10.0000 mL | INTRAVENOUS | Status: DC | PRN
  Administered 2020-06-22: 10 mL via INTRAVENOUS
  Filled 2020-06-22: qty 10

## 2020-06-22 NOTE — Telephone Encounter (Signed)
-----   Message from Cade sent at 06/22/2020  2:15 PM EST ----- Regarding: [place] Team,   Scanned to media tab for review.  Thanks, [employer]

## 2020-06-22 NOTE — Progress Notes (Signed)
Pt in for follow up, states swelling in legs has improved. States has some constipation but using stool softeners.  Pt reports norvasc was changed to 5mg  daily.

## 2020-06-22 NOTE — Telephone Encounter (Signed)
Faxed requested insurance information to [employer]

## 2020-06-22 NOTE — Progress Notes (Signed)
Patient tolerated chemo infusion well, no concerns voiced. Patient discharged. Stable.

## 2020-06-22 NOTE — Assessment & Plan Note (Addendum)
#  stage IV breast cancer ER/PR positive HER-2 negative -October 30 CT scan-progression of disease-pleural metastases/bilateral effusion; worsening lytic lesions in the bones.  Currently on weekly DOXIL.  # Proceed with #2 weekly DOXIL today. Labs today reviewed;  acceptable for treatment today.   # Bilateral pleural effusion status post thoracentesis [OCT 2021]-cytology negative. Recent chest x-ray with PCP-negative as per patient. Monitor closely. Patient on O2 nasal cannula.  # Anemia-hemoglobin-nadir 6.7 s/p PRBC transfusion; currently hemoglobin-9.7- STABLE.  Monitor closely high risk of worsening anemia on chemotherapy.  # Chronic kidney disease - stage IV [GFR-24]- STABLE.  Recent evaluation with Dr. Juleen China.  # Bone mets-sclerotic; Right acetabular uptake-s/p radiation; CT scan shows worsening metastases in the vertebrae; currently asymptomatic-STABLE.   # DISPOSITION:  # DOXIL today. NO PRBC.  #  Follow up in 11/29 week MD; labs- cbc/cmp;ca-27-29;;-DOXIL; HOLD tube;- Dr.B

## 2020-06-22 NOTE — Progress Notes (Signed)
Per MD ok to proceed with treatment, will get echo in 1-2 months

## 2020-06-22 NOTE — Progress Notes (Signed)
[place] OFFICE PROGRESS NOTE  Patient Care Team: [name], [name], MD as Medical Oncologist (Medical Oncology) Corey Skains, MD as Consulting Physician (Cardiology)  Cancer Staging No matching staging information was found for the patient.   Oncology History Overview Note  # OCT 2015-STAGE IV LEFT BREAST T2N1 [T=4cm; [name]; [name]; [name]; EBUS- Positive Paratrac/subcarinal LN s/p ? Taxotere [in [place]; [name]; [name];[name]; [name]; suv 3]; [name]; [name];[name];  # ? Bony lesions- PET sep 2016-non-hypermetabolic sclerotic lesions T10; Ant R iliac bone; inferior sternum- not on X-geva  # April 2019- PET scan Progression/pleural based lesions effusion/bone lesions.   # TAXOL   # SEP [id number]- July 2021- ERIBULIN; Progression  # SEP-OCT 2021- GEMCITABINE [interrupted because of hospitalization; progressive disease; discontinued]; #September 2021-left foot osteomyelitis s/p metatarsal amputation [Dr.Fowler/ Dr. Ola Spurr  #October 30th 2021 [hospitalization]-CT scan progressive pleural lesion bilateral pleural effusion/bone lesions; cytology negative status post thoracentesis x2; hemoglobin 6.5 s/p PRBC transfusion Discontinue gemcitabine  # NOV 11th 2021-Doxil weekly.  # Poorly controlled Blood sugars- improved.   # Pancreatitis Hx/PEI- on creon in past / CKD IV [creat ~ 3-4; [name]; [name]; mild left sided weakness]  # Jan 2020-  Lobular lesion on tongue- s/p excision pyogenic granuloma [Dr.McQueen]   # GENETIC TESTING/COUNSELLING: HETEROZYGOUS Cystic Fibrosis Gene [explains hx of recurrent  pancreatitis]  # MOLECULAR TESTING: NA   # PALLIATIVE CARE: 1/22-Discussed/Declined ------------------------------------------------   DIAGNOSIS: [ 2015] BREAST CA; [name]; [name] ;GOALS: Palliative  CURRENT/MOST RECENT THERAPY: DOXIL [C].     Carcinoma of upper-inner quadrant of left breast in female, estrogen receptor positive ([place])  04/29/2019 - 02/24/2020 Chemotherapy   The patient had eriBULin mesylate (HALAVEN) 2 mg in sodium chloride 0.9 % 100 mL chemo infusion, 2 mg, Intravenous,  Once, 12 of 14 cycles Dose modification: 1 mg/m2 (original dose 1 mg/m2, Cycle 1, Reason: Provider Judgment) Administration: 2 mg (06/03/2019), 2 mg (04/29/2019), 2 mg (06/10/2019), 2 mg (07/04/2019), 2 mg (07/11/2019), 2 mg (07/25/2019), 2 mg (08/03/2019), 2 mg (08/19/2019), 2 mg (08/26/2019), 2 mg (09/09/2019), 2 mg (09/16/2019), 2 mg (10/07/2019), 2 mg (10/14/2019), 2 mg (10/28/2019), 2 mg (11/04/2019), 2 mg (11/28/2019), 2 mg (12/09/2019), 2 mg (01/13/2020), 2 mg (01/20/2020), 2 mg (02/02/2020), 2 mg (02/13/2020), 2 mg (02/24/2020)  for chemotherapy treatment.    04/30/2020 - 05/28/2020 Chemotherapy   The patient had gemcitabine (GEMZAR) 1,600 mg in sodium chloride 0.9 % 250 mL chemo infusion, 1,596 mg, Intravenous,  Once, 2 of 5 cycles Administration: 1,600 mg (04/30/2020), 1,600 mg (05/07/2020), 1,600 mg (05/28/2020)  for chemotherapy treatment.    06/15/2020 -  Chemotherapy   The patient had DOXOrubicin HCL LIPOSOMAL (DOXIL) 30 mg in dextrose 5 % 250 mL chemo infusion, 15 mg/m2 = 30 mg, Intravenous,  Once, 2 of 7 cycles Administration: 30 mg (06/15/2020), 30 mg (06/22/2020)  for chemotherapy treatment.     INTERVAL HISTORY:  [name] 64 y.o.  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  In the interim patient was evaluated by PCP-chest x-ray  told to have no worsening pleural effusion.  Patient continues to be on O2 2 L.  Also evaluated by nephrology Dr. Juleen China, noted to have stable renal function. Patient continues to be on diuretic which has helped her swelling in the legs. However lost weight likely secondary to diuretics.  Also evaluated by Dr. Vickki Muff from podiatry.-States her leg infection seems to be improving/healing. No fever no chills. No nausea or vomiting.  Review of Systems  Constitutional: Positive for malaise/fatigue and weight loss. Negative for chills, diaphoresis and fever.  HENT: Negative for nosebleeds and sore throat.   Eyes: Negative for double vision.  Respiratory: Negative for cough, hemoptysis, sputum production, [name], palpitations and orthopnea.  Gastrointestinal: Negative for abdominal pain, blood in stool, constipation, diarrhea, heartburn, melena, nausea and vomiting.  Genitourinary: Negative for dysuria, frequency and urgency.  Musculoskeletal: Positive for back pain and joint pain.  Skin: Negative.  Negative for itching and rash.  Neurological: Positive for tingling. Negative for dizziness, focal weakness, weakness and headaches.  Endo/Heme/Allergies: Does not bruise/bleed easily.  Psychiatric/Behavioral: Negative for depression. The patient is not nervous/anxious and does not have insomnia.     PAST MEDICAL HISTORY :  Past Medical History:  Diagnosis Date  . Anemia   . Anxiety   . Asthma   . Cancer ([place]) 03/10/2018   Per NM PET order. Carcinoma of upper-inner quadrant of left breast in female, estrogen receptor positive .  Marland Kitchen Cancer (HCC)    LUNG  . CHF (congestive heart failure) ([place]) 1997  . CKD (chronic kidney disease)   . Depression   . Diabetes mellitus, type 2 ([place])   . Family history of breast cancer   . Family history of colon cancer   . Family history of ovarian cancer   . Family history of pancreatic cancer   . Family history of prostate cancer   .  Family history of stomach cancer   . GERD (gastroesophageal reflux disease)    history of an ulcer  . Hair loss   . History of left breast cancer 05/29/14  . History of partial hysterectomy 12/31/2016   Per patient.  Has not had a period in years.  Had a partial hysterectomy years ago.  Marland Kitchen Hypertension   . Mitral valve regurgitation   . Neuromuscular disorder (HCC)    neuropathies in hand  . Obesity   . Pancreatitis 1997  . Stroke [facility]) 2010   with mild left arm weakness    PAST SURGICAL HISTORY :   Past Surgical History:  Procedure Laterality Date  . AMPUTATION Left 03/30/2020   Procedure: AMPUTATION 5th RAY;  Surgeon: Samara Deist, DPM;  Location: [facility] ORS;  Service: Podiatry;  Laterality: Left;  . APPLICATION OF WOUND VAC Left 03/30/2020   Procedure: APPLICATION OF WOUND VAC;  Surgeon: Samara Deist, DPM;  Location: [facility] ORS;  Service: Podiatry;  Laterality: Left;  . CATARACT EXTRACTION W/PHACO Right 02/24/2019   Procedure: CATARACT EXTRACTION PHACO AND INTRAOCULAR LENS PLACEMENT ([place]) RIGHT DIABETES;  Surgeon: Marchia Meiers, MD;  Location: [facility] ORS;  Service: Ophthalmology;  Laterality: Right;  [place] 01:13.0 CDE 7.96 Fluid Pack Lot # [id number]  . CATARACT EXTRACTION W/PHACO Left 03/24/2019   Procedure: CATARACT EXTRACTION PHACO AND INTRAOCULAR LENS PLACEMENT ([facility]) - left diabetic;  Surgeon: Marchia Meiers, MD;  Location: [facility] OR[facility];  Service: Ophthalmology;  Laterality: Left;  [place]  01:36 CDE 13.93 Fluid pack lot # [id number]  . CENTRAL  LINE INSERTION-TUNNELED N/A 04/04/2020   Procedure: CENTRAL LINE INSERTION-TUNNELED;  Surgeon: Delana Meyer Dolores Lory, MD;  Location: [place] [facility];  Service: Cardiovascular;  Laterality: N/A;  . CESAREAN SECTION    . CHOLECYSTECTOMY    . DIALYSIS/PERMA CATHETER REMOVAL N/A 05/01/2020   Procedure: DIALYSIS/PERMA CATHETER REMOVAL;  Surgeon: Katha Cabal, MD;  Location: [place] [facility];  Service: Cardiovascular;  Laterality: N/A;  . EXCISION OF  TONGUE LESION N/A 08/17/2018   Procedure: EXCISION OF TONGUE LESION WITH FROZEN SECTIONS;  Surgeon: Beverly Gust, MD;  Location: [facility] ORS;  Service: ENT;  Laterality: N/A;  . EYE SURGERY Right    cataract extraction  . IRRIGATION AND DEBRIDEMENT FOOT Left 03/30/2020   Procedure: IRRIGATION AND DEBRIDEMENT FOOT;  Surgeon: Samara Deist, DPM;  Location: [facility] OR[facility];  Service: Podiatry;  Laterality: Left;  . PARTIAL HYSTERECTOMY  12/31/2016   Per patient, she has not had a period in years since she had a partial hysterectomy.  Marland Kitchen PORTA CATH INSERTION    . TUBAL LIGATION      FAMILY HISTORY :   Family History  Problem Relation Age of Onset  . Ovarian cancer Mother 54  . Diabetes Mother   . Hypertension Mother   . COPD Father   . Hypertension Father   . Colon cancer Father 109  . Diabetes Sister   . Breast cancer Sister 20       bilateral  . Diabetes Brother   . Leukemia Maternal Aunt   . Pancreatic cancer Paternal Aunt 72  . Pancreatic cancer Paternal Uncle   . Colon cancer Paternal Uncle   . Stomach cancer Maternal Grandfather 49  . Throat cancer Paternal Grandmother   . Breast cancer Maternal Aunt 80  . Colon cancer Maternal Aunt   . Bone cancer Maternal Aunt   . Breast cancer Paternal Aunt        dx >50  . Prostate cancer Paternal Uncle   . Pancreatic cancer Paternal Uncle   . Throat cancer Paternal Uncle   . Lung cancer Paternal Uncle   . Stomach cancer Paternal Uncle   . Brain cancer Paternal Aunt   . Cancer Cousin        liver, kidney  . Prostate cancer Cousin        meastatic  . Lung cancer Other     SOCIAL HISTORY:   Social History   Tobacco Use  . Smoking status: Former Smoker    Packs/day: 0.50    Years: 1.00    Pack years: 0.50    Types: Cigarettes  . Smokeless tobacco: Never Used  Vaping Use  . Vaping Use: Never used  Substance Use Topics  . Alcohol use: No    Alcohol/week: 0.0 standard drinks  . Drug use: No    ALLERGIES:  is allergic to  fish-derived products, sulfamethoxazole-trimethoprim, and chlorhexidine.  MEDICATIONS:  Current Outpatient Medications  Medication Sig Dispense Refill  . acetaminophen-codeine (TYLENOL #4) 300-60 MG tablet Take 1 tablet by mouth in the morning and at bedtime.     Marland Kitchen albuterol (PROAIR HFA) 108 (90 BASE) MCG/ACT inhaler Inhale 2 puffs into the lungs every 6 (six) hours as needed for wheezing or shortness of breath.     . ALPRAZolam (XANAX) 0.5 MG tablet Take 0.5 mg by mouth 2 (two) times daily as needed for anxiety or sleep.     Marland Kitchen amLODipine (NORVASC) 10 MG tablet Take 10 mg by mouth daily. Pt takes 77m (1/2 of  52m) pill a day    . aspirin EC 81 MG tablet Take 81 mg by mouth daily.     .Marland Kitchenatenolol (TENORMIN) 50 MG tablet Take 50 mg by mouth 2 (two) times daily.     . bumetanide (BUMEX) 1 MG tablet Take 1 tablet (1 mg total) by mouth 2 (two) times daily. 60 tablet 0  . calcitRIOL (ROCALTROL) 0.25 MCG capsule Take 0.25 mcg by mouth daily.     . Cinnamon 500 MG capsule Take 500 mg by mouth 2 (two) times daily.     . cloNIDine (CATAPRES) 0.2 MG tablet Take 0.2 mg by mouth 2 (two) times daily.     .Marland Kitchendocusate sodium (COLACE) 100 MG capsule Take 1 capsule (100 mg total) by mouth 2 (two) times daily. (Patient taking differently: Take 100 mg by mouth daily. ) 10 capsule 0  . enalapril (VASOTEC) 10 MG tablet Take 20 mg by mouth 2 (two) times a day.     . famotidine (PEPCID) 20 MG tablet Take 20 mg by mouth 2 (two) times daily.     . ferrous sulfate 325 (65 FE) MG tablet Take 325 mg by mouth daily with breakfast.    . FLUoxetine (PROZAC) 20 MG capsule Take 20 mg by mouth 2 (two) times daily.     . Fluticasone Propionate, Inhal, (FLOVENT DISKUS) 100 MCG/BLIST AEPB Inhale 2 puffs into the lungs in the morning and at bedtime.    . gabapentin (NEURONTIN) 100 MG capsule TAKE 1 CAPSULE(100 MG) BY MOUTH AT BEDTIME (Patient taking differently: Take 100 mg by mouth at bedtime. ) 30 capsule 6  . glyBURIDE (DIABETA) 5  MG tablet Take 10 mg by mouth 2 (two) times daily with a meal.     . LEVEMIR FLEXTOUCH 100 UNIT/ML FlexPen Inject 25 Units into the skin daily. 15 mL 11  . NOVOLOG FLEXPEN 100 UNIT/ML FlexPen Inject 5 Units into the skin 3 (three) times daily with meals. (Patient taking differently: Inject 7 Units into the skin 2 (two) times daily with a meal. ) 15 mL 11  . senna (SENOKOT) 8.6 MG TABS tablet Take 1 tablet (8.6 mg total) by mouth 2 (two) times daily as needed for mild constipation or moderate constipation. 120 tablet 0  . simvastatin (ZOCOR) 20 MG tablet Take 20 mg by mouth every evening.      No current facility-administered medications for this visit.   Facility-Administered Medications Ordered in Other Visits  Medication Dose Route Frequency Provider Last Rate Last Admin  . sodium chloride flush (NS) 0.9 % injection 10 mL  10 mL Intravenous PRN BCammie Sickle MD   10 mL at 01/30/16 1054    PHYSICAL EXAMINATION: ECOG PERFORMANCE STATUS: 1 - Symptomatic but completely ambulatory  BP 130/81 (BP Location: Left Arm, Patient Position: Sitting)   Pulse 74   Temp 97.7 F (36.5 C) (Tympanic)   Resp 18   Ht 5' 2"  (1.575 m)   Wt 170 lb 8 oz (77.3 kg)   SpO2 100%   BMI 31.18 kg/m   Filed Weights   06/22/20 0843  Weight: 170 lb 8 oz (77.3 kg)    Physical Exam Constitutional:      Comments: She is alone.  HENT:     Head: Normocephalic and atraumatic.     Mouth/Throat:     Pharynx: No oropharyngeal exudate.  Eyes:     Pupils: Pupils are equal, round, and reactive to light.  Cardiovascular:  Rate and Rhythm: Normal rate and regular rhythm.  Pulmonary:     Effort: No respiratory distress.     Breath sounds: Normal breath sounds. No wheezing.  Abdominal:     General: Bowel sounds are normal. There is no distension.     Palpations: Abdomen is soft. There is no mass.     Tenderness: There is no abdominal tenderness. There is no guarding or rebound.  Musculoskeletal:         General: No tenderness. Normal range of motion.     Cervical back: Normal range of motion and neck supple.  Skin:    General: Skin is warm.     Comments: Left sole-ulceration noted/see picture below.  Neurological:     Mental Status: She is alert and oriented to person, place, and time.  Psychiatric:        Mood and Affect: Affect normal.     LABORATORY DATA:  I have reviewed the data as listed    Component Value Date/Time   NA 137 06/22/2020 0757   NA 130 (L) 06/06/2014 1102   K 4.2 06/22/2020 0757   K 3.9 06/06/2014 1102   CL 96 (L) 06/22/2020 0757   CL 95 (L) 06/06/2014 1102   CO2 32 06/22/2020 0757   CO2 28 06/06/2014 1102   GLUCOSE 97 06/22/2020 0757   GLUCOSE 349 (H) 06/06/2014 1102   BUN 31 (H) 06/22/2020 0757   BUN 17 06/06/2014 1102   CREATININE 2.23 (H) 06/22/2020 0757   CREATININE 1.63 (H) 06/06/2014 1102   CALCIUM 9.1 06/22/2020 0757   CALCIUM 9.2 06/06/2014 1102   PROT 7.5 06/22/2020 0757   PROT 8.2 06/06/2014 1102   ALBUMIN 2.6 (L) 06/22/2020 0757   ALBUMIN 3.3 (L) 06/06/2014 1102   AST 16 06/22/2020 0757   AST 7 (L) 06/06/2014 1102   ALT 9 06/22/2020 0757   ALT 12 (L) 06/06/2014 1102   ALKPHOS 78 06/22/2020 0757   ALKPHOS 74 06/06/2014 1102   BILITOT 0.5 06/22/2020 0757   BILITOT 0.4 06/06/2014 1102   GFRNONAA 24 (L) 06/22/2020 0757   GFRNONAA 35 (L) 06/06/2014 1102   GFRAA 24 (L) 05/07/2020 0807   GFRAA 42 (L) 06/06/2014 1102    No results found for: SPEP, UPEP  Lab Results  Component Value Date   WBC 11.0 (H) 06/22/2020   NEUTROABS 8.2 (H) 06/22/2020   HGB 8.3 (L) 06/22/2020   HCT 25.4 (L) 06/22/2020   MCV 90.7 06/22/2020   PLT 387 06/22/2020      Chemistry      Component Value Date/Time   NA 137 06/22/2020 0757   NA 130 (L) 06/06/2014 1102   K 4.2 06/22/2020 0757   K 3.9 06/06/2014 1102   CL 96 (L) 06/22/2020 0757   CL 95 (L) 06/06/2014 1102   CO2 32 06/22/2020 0757   CO2 28 06/06/2014 1102   BUN 31 (H) 06/22/2020 0757   BUN  17 06/06/2014 1102   CREATININE 2.23 (H) 06/22/2020 0757   CREATININE 1.63 (H) 06/06/2014 1102      Component Value Date/Time   CALCIUM 9.1 06/22/2020 0757   CALCIUM 9.2 06/06/2014 1102   ALKPHOS 78 06/22/2020 0757   ALKPHOS 74 06/06/2014 1102   AST 16 06/22/2020 0757   AST 7 (L) 06/06/2014 1102   ALT 9 06/22/2020 0757   ALT 12 (L) 06/06/2014 1102   BILITOT 0.5 06/22/2020 0757   BILITOT 0.4 06/06/2014 1102  RADIOGRAPHIC STUDIES: I have personally reviewed the radiological images as listed and agreed with the findings in the report. No results found.   ASSESSMENT & PLAN:  Carcinoma of upper-inner quadrant of left breast in female, estrogen receptor positive ([place]) # stage IV breast cancer ER/PR positive HER-2 negative -October 30 CT scan-progression of disease-pleural metastases/bilateral effusion; worsening lytic lesions in the bones.  Currently on weekly DOXIL.  # Proceed with #2 weekly DOXIL today. Labs today reviewed;  acceptable for treatment today.   # Bilateral pleural effusion status post thoracentesis [OCT 2021]-cytology negative. Recent chest x-ray with PCP-negative as per patient. Monitor closely. Patient on O2 nasal cannula.  # Anemia-hemoglobin-nadir 6.7 s/p PRBC transfusion; currently hemoglobin-9.7- STABLE.  Monitor closely high risk of worsening anemia on chemotherapy.  # Chronic kidney disease - stage IV [GFR-24]- STABLE.  Recent evaluation with Dr. Juleen China.  # Bone mets-sclerotic; Right acetabular uptake-s/p radiation; CT scan shows worsening metastases in the vertebrae; currently asymptomatic-STABLE.   # DISPOSITION:  # DOXIL today. NO PRBC.  #  Follow up in 11/29 week MD; labs- cbc/cmp;ca-27-29;;-DOXIL; HOLD tube;- Dr.B     Orders Placed This Encounter  Procedures  . CBC with Differential    Standing Status:   Future    Standing Expiration Date:   06/22/2021  . Comprehensive metabolic panel    Standing Status:   Future    Standing  Expiration Date:   06/22/2021  . Cancer antigen 27.29    Standing Status:   Future    Standing Expiration Date:   06/22/2021  . Hold Tube- Blood Bank    Standing Status:   Future    Standing Expiration Date:   06/22/2021   All questions were answered. The patient knows to call the clinic with any problems, questions or concerns.      Cammie Sickle, MD 06/24/2020 8:28 AM

## 2020-06-25 DIAGNOSIS — N184 Chronic kidney disease, stage 4 (severe): Secondary | ICD-10-CM | POA: Diagnosis not present

## 2020-06-25 DIAGNOSIS — I051 Rheumatic mitral insufficiency: Secondary | ICD-10-CM | POA: Diagnosis not present

## 2020-06-25 DIAGNOSIS — E1122 Type 2 diabetes mellitus with diabetic chronic kidney disease: Secondary | ICD-10-CM | POA: Diagnosis not present

## 2020-06-25 DIAGNOSIS — Z4781 Encounter for orthopedic aftercare following surgical amputation: Secondary | ICD-10-CM | POA: Diagnosis not present

## 2020-06-25 DIAGNOSIS — E1142 Type 2 diabetes mellitus with diabetic polyneuropathy: Secondary | ICD-10-CM | POA: Diagnosis not present

## 2020-06-25 DIAGNOSIS — I5042 Chronic combined systolic (congestive) and diastolic (congestive) heart failure: Secondary | ICD-10-CM | POA: Diagnosis not present

## 2020-06-25 DIAGNOSIS — C50919 Malignant neoplasm of unspecified site of unspecified female breast: Secondary | ICD-10-CM | POA: Diagnosis not present

## 2020-06-25 DIAGNOSIS — J45909 Unspecified asthma, uncomplicated: Secondary | ICD-10-CM | POA: Diagnosis not present

## 2020-06-25 DIAGNOSIS — I130 Hypertensive heart and chronic kidney disease with heart failure and stage 1 through stage 4 chronic kidney disease, or unspecified chronic kidney disease: Secondary | ICD-10-CM | POA: Diagnosis not present

## 2020-06-27 DIAGNOSIS — Z4781 Encounter for orthopedic aftercare following surgical amputation: Secondary | ICD-10-CM | POA: Diagnosis not present

## 2020-06-27 DIAGNOSIS — N184 Chronic kidney disease, stage 4 (severe): Secondary | ICD-10-CM | POA: Diagnosis not present

## 2020-06-27 DIAGNOSIS — I051 Rheumatic mitral insufficiency: Secondary | ICD-10-CM | POA: Diagnosis not present

## 2020-06-27 DIAGNOSIS — C50919 Malignant neoplasm of unspecified site of unspecified female breast: Secondary | ICD-10-CM | POA: Diagnosis not present

## 2020-06-27 DIAGNOSIS — I130 Hypertensive heart and chronic kidney disease with heart failure and stage 1 through stage 4 chronic kidney disease, or unspecified chronic kidney disease: Secondary | ICD-10-CM | POA: Diagnosis not present

## 2020-06-27 DIAGNOSIS — E1122 Type 2 diabetes mellitus with diabetic chronic kidney disease: Secondary | ICD-10-CM | POA: Diagnosis not present

## 2020-06-27 DIAGNOSIS — J45909 Unspecified asthma, uncomplicated: Secondary | ICD-10-CM | POA: Diagnosis not present

## 2020-06-27 DIAGNOSIS — I5042 Chronic combined systolic (congestive) and diastolic (congestive) heart failure: Secondary | ICD-10-CM | POA: Diagnosis not present

## 2020-06-27 DIAGNOSIS — E1142 Type 2 diabetes mellitus with diabetic polyneuropathy: Secondary | ICD-10-CM | POA: Diagnosis not present

## 2020-07-02 ENCOUNTER — Other Ambulatory Visit: Payer: Self-pay | Admitting: *Deleted

## 2020-07-02 ENCOUNTER — Inpatient Hospital Stay: Payer: Medicare HMO

## 2020-07-02 ENCOUNTER — Inpatient Hospital Stay (HOSPITAL_BASED_OUTPATIENT_CLINIC_OR_DEPARTMENT_OTHER): Payer: Medicare HMO | Admitting: Internal Medicine

## 2020-07-02 VITALS — BP 168/91 | HR 82 | Temp 96.8°F | Resp 18

## 2020-07-02 DIAGNOSIS — Z7189 Other specified counseling: Secondary | ICD-10-CM

## 2020-07-02 DIAGNOSIS — Z170 Estrogen receptor positive status [ER+]: Secondary | ICD-10-CM | POA: Diagnosis not present

## 2020-07-02 DIAGNOSIS — Z5111 Encounter for antineoplastic chemotherapy: Secondary | ICD-10-CM | POA: Diagnosis not present

## 2020-07-02 DIAGNOSIS — I509 Heart failure, unspecified: Secondary | ICD-10-CM | POA: Diagnosis not present

## 2020-07-02 DIAGNOSIS — C50212 Malignant neoplasm of upper-inner quadrant of left female breast: Secondary | ICD-10-CM | POA: Diagnosis not present

## 2020-07-02 DIAGNOSIS — R5383 Other fatigue: Secondary | ICD-10-CM | POA: Diagnosis not present

## 2020-07-02 DIAGNOSIS — D72829 Elevated white blood cell count, unspecified: Secondary | ICD-10-CM | POA: Diagnosis not present

## 2020-07-02 DIAGNOSIS — D649 Anemia, unspecified: Secondary | ICD-10-CM

## 2020-07-02 DIAGNOSIS — C7951 Secondary malignant neoplasm of bone: Secondary | ICD-10-CM | POA: Diagnosis not present

## 2020-07-02 DIAGNOSIS — I130 Hypertensive heart and chronic kidney disease with heart failure and stage 1 through stage 4 chronic kidney disease, or unspecified chronic kidney disease: Secondary | ICD-10-CM | POA: Diagnosis not present

## 2020-07-02 DIAGNOSIS — R5381 Other malaise: Secondary | ICD-10-CM | POA: Diagnosis not present

## 2020-07-02 LAB — COMPREHENSIVE METABOLIC PANEL
ALT: 9 U/L (ref 0–44)
AST: 12 U/L — ABNORMAL LOW (ref 15–41)
Albumin: 2.6 g/dL — ABNORMAL LOW (ref 3.5–5.0)
Alkaline Phosphatase: 65 U/L (ref 38–126)
Anion gap: 11 (ref 5–15)
BUN: 46 mg/dL — ABNORMAL HIGH (ref 8–23)
CO2: 31 mmol/L (ref 22–32)
Calcium: 9 mg/dL (ref 8.9–10.3)
Chloride: 94 mmol/L — ABNORMAL LOW (ref 98–111)
Creatinine, Ser: 2.44 mg/dL — ABNORMAL HIGH (ref 0.44–1.00)
GFR, Estimated: 22 mL/min — ABNORMAL LOW (ref >60)
Glucose, Bld: 208 mg/dL — ABNORMAL HIGH (ref 70–99)
Potassium: 4.2 mmol/L (ref 3.5–5.1)
Sodium: 136 mmol/L (ref 135–145)
Total Bilirubin: 0.5 mg/dL (ref 0.3–1.2)
Total Protein: 7.8 g/dL (ref 6.5–8.1)

## 2020-07-02 LAB — CBC WITH DIFFERENTIAL/PLATELET
Abs Immature Granulocytes: 0.07 10*3/uL (ref 0.00–0.07)
Basophils Absolute: 0 10*3/uL (ref 0.0–0.1)
Basophils Relative: 0 %
Eosinophils Absolute: 0.2 10*3/uL (ref 0.0–0.5)
Eosinophils Relative: 2 %
HCT: 23.3 % — ABNORMAL LOW (ref 36.0–46.0)
Hemoglobin: 7.6 g/dL — ABNORMAL LOW (ref 12.0–15.0)
Immature Granulocytes: 1 %
Lymphocytes Relative: 6 %
Lymphs Abs: 0.7 10*3/uL (ref 0.7–4.0)
MCH: 29.7 pg (ref 26.0–34.0)
MCHC: 32.6 g/dL (ref 30.0–36.0)
MCV: 91 fL (ref 80.0–100.0)
Monocytes Absolute: 1.1 10*3/uL — ABNORMAL HIGH (ref 0.1–1.0)
Monocytes Relative: 10 %
Neutro Abs: 9.1 10*3/uL — ABNORMAL HIGH (ref 1.7–7.7)
Neutrophils Relative %: 81 %
Platelets: 286 10*3/uL (ref 150–400)
RBC: 2.56 MIL/uL — ABNORMAL LOW (ref 3.87–5.11)
RDW: 16.1 % — ABNORMAL HIGH (ref 11.5–15.5)
WBC: 11.2 10*3/uL — ABNORMAL HIGH (ref 4.0–10.5)
nRBC: 0 % (ref 0.0–0.2)

## 2020-07-02 LAB — PREPARE RBC (CROSSMATCH)

## 2020-07-02 MED ORDER — ACETAMINOPHEN 325 MG PO TABS
650.0000 mg | ORAL_TABLET | Freq: Once | ORAL | Status: AC
  Administered 2020-07-02: 650 mg via ORAL
  Filled 2020-07-02: qty 2

## 2020-07-02 MED ORDER — DEXTROSE 5 % IV SOLN
Freq: Once | INTRAVENOUS | Status: AC
  Filled 2020-07-02: qty 250

## 2020-07-02 MED ORDER — HEPARIN SOD (PORK) LOCK FLUSH 100 UNIT/ML IV SOLN
INTRAVENOUS | Status: AC
  Filled 2020-07-02: qty 5

## 2020-07-02 MED ORDER — HEPARIN SOD (PORK) LOCK FLUSH 100 UNIT/ML IV SOLN
500.0000 [IU] | Freq: Every day | INTRAVENOUS | Status: DC | PRN
  Filled 2020-07-02: qty 5

## 2020-07-02 MED ORDER — HEPARIN SOD (PORK) LOCK FLUSH 100 UNIT/ML IV SOLN
500.0000 [IU] | Freq: Once | INTRAVENOUS | Status: AC
  Administered 2020-07-02: 500 [IU] via INTRAVENOUS
  Filled 2020-07-02: qty 5

## 2020-07-02 MED ORDER — DOXORUBICIN HCL LIPOSOMAL CHEMO INJECTION 2 MG/ML
15.0000 mg/m2 | Freq: Once | INTRAVENOUS | Status: AC
  Administered 2020-07-02: 30 mg via INTRAVENOUS
  Filled 2020-07-02: qty 15

## 2020-07-02 MED ORDER — DIPHENHYDRAMINE HCL 25 MG PO CAPS
25.0000 mg | ORAL_CAPSULE | Freq: Once | ORAL | Status: AC
  Administered 2020-07-02: 25 mg via ORAL
  Filled 2020-07-02: qty 1

## 2020-07-02 MED ORDER — SODIUM CHLORIDE 0.9% FLUSH
10.0000 mL | Freq: Once | INTRAVENOUS | Status: AC
  Administered 2020-07-02: 10 mL via INTRAVENOUS
  Filled 2020-07-02: qty 10

## 2020-07-02 MED ORDER — SODIUM CHLORIDE 0.9% IV SOLUTION
250.0000 mL | Freq: Once | INTRAVENOUS | Status: AC
  Administered 2020-07-02: 250 mL via INTRAVENOUS
  Filled 2020-07-02: qty 250

## 2020-07-02 MED ORDER — SODIUM CHLORIDE 0.9 % IV SOLN
10.0000 mg | Freq: Once | INTRAVENOUS | Status: AC
  Administered 2020-07-02: 10 mg via INTRAVENOUS
  Filled 2020-07-02: qty 10

## 2020-07-02 NOTE — Progress Notes (Signed)
[place] OFFICE PROGRESS NOTE  Patient Care Team: [name], [name], MD as Medical Oncologist (Medical Oncology) Corey Skains, MD as Consulting Physician (Cardiology)  Cancer Staging No matching staging information was found for the patient.   Oncology History Overview Note  # OCT 2015-STAGE IV LEFT BREAST T2N1 [T=4cm; [name]; [name]; [name]; EBUS- Positive Paratrac/subcarinal LN s/p ? Taxotere [in [place]; [name]; [name];[name]; [name]; suv 3]; [name]; [name];[name];  # ? Bony lesions- PET sep 2016-non-hypermetabolic sclerotic lesions T10; Ant R iliac bone; inferior sternum- not on X-geva  # April 2019- PET scan Progression/pleural based lesions effusion/bone lesions.   # TAXOL   # SEP [id number]- July 2021- ERIBULIN; Progression  # SEP-OCT 2021- GEMCITABINE [interrupted because of hospitalization; progressive disease; discontinued]; #September 2021-left foot osteomyelitis s/p metatarsal amputation [Dr.Fowler/ Dr. Ola Spurr  #October 30th 2021 [hospitalization]-CT scan progressive pleural lesion bilateral pleural effusion/bone lesions; cytology negative status post thoracentesis x2; hemoglobin 6.5 s/p PRBC transfusion Discontinue gemcitabine  # NOV 11th 2021-Doxil weekly.  # Poorly controlled Blood sugars- improved.   # Pancreatitis Hx/PEI- on creon in past / CKD IV [creat ~ 3-4; [name]; [name]; mild left sided weakness]  # Jan 2020-  Lobular lesion on tongue- s/p excision pyogenic granuloma [Dr.McQueen]   # GENETIC TESTING/COUNSELLING: HETEROZYGOUS Cystic Fibrosis Gene [explains hx of recurrent  pancreatitis]  # MOLECULAR TESTING: NA   # PALLIATIVE CARE: 1/22-Discussed/Declined ------------------------------------------------   DIAGNOSIS: [ 2015] BREAST CA; [name]; [name] ;GOALS: Palliative  CURRENT/MOST RECENT THERAPY: DOXIL [C].     Carcinoma of upper-inner quadrant of left breast in female, estrogen receptor positive ([place])  04/29/2019 - 02/24/2020 Chemotherapy   The patient had eriBULin mesylate (HALAVEN) 2 mg in sodium chloride 0.9 % 100 mL chemo infusion, 2 mg, Intravenous,  Once, 12 of 14 cycles Dose modification: 1 mg/m2 (original dose 1 mg/m2, Cycle 1, Reason: Provider Judgment) Administration: 2 mg (06/03/2019), 2 mg (04/29/2019), 2 mg (06/10/2019), 2 mg (07/04/2019), 2 mg (07/11/2019), 2 mg (07/25/2019), 2 mg (08/03/2019), 2 mg (08/19/2019), 2 mg (08/26/2019), 2 mg (09/09/2019), 2 mg (09/16/2019), 2 mg (10/07/2019), 2 mg (10/14/2019), 2 mg (10/28/2019), 2 mg (11/04/2019), 2 mg (11/28/2019), 2 mg (12/09/2019), 2 mg (01/13/2020), 2 mg (01/20/2020), 2 mg (02/02/2020), 2 mg (02/13/2020), 2 mg (02/24/2020)  for chemotherapy treatment.    04/30/2020 - 05/28/2020 Chemotherapy   The patient had gemcitabine (GEMZAR) 1,600 mg in sodium chloride 0.9 % 250 mL chemo infusion, 1,596 mg, Intravenous,  Once, 2 of 5 cycles Administration: 1,600 mg (04/30/2020), 1,600 mg (05/07/2020), 1,600 mg (05/28/2020)  for chemotherapy treatment.    06/15/2020 -  Chemotherapy   The patient had DOXOrubicin HCL LIPOSOMAL (DOXIL) 30 mg in dextrose 5 % 250 mL chemo infusion, 15 mg/m2 = 30 mg, Intravenous,  Once, 3 of 7 cycles Administration: 30 mg (06/15/2020), 30 mg (06/22/2020)  for chemotherapy treatment.     INTERVAL HISTORY:  [name] 64 y.o.  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  In the interim patient has not had any hospitalizations.  No fever no chills.  Continues to be on 2 L  nasal cannula oxygen.  No worsening swelling in the legs.  She continues weight likely second diuretics.  Denies any worsening pain.  She is currently awaiting repeat evaluation with wound care tomorrow.  Complains of fatigue.  Review of Systems  Constitutional: Positive for malaise/fatigue and weight loss. Negative for chills, diaphoresis and fever.  HENT: Negative for nosebleeds and sore throat.   Eyes: Negative for double vision.  Respiratory: Negative for cough, hemoptysis, sputum production, [name], palpitations and orthopnea.  Gastrointestinal: Negative for abdominal pain, blood in stool, constipation, diarrhea, heartburn, melena, nausea and vomiting.  Genitourinary: Negative for dysuria, frequency and urgency.  Musculoskeletal: Positive for back pain and joint pain.  Skin: Negative.  Negative for itching and rash.  Neurological: Positive for tingling. Negative for dizziness, focal weakness, weakness and headaches.  Endo/Heme/Allergies: Does not bruise/bleed easily.  Psychiatric/Behavioral: Negative for depression. The patient is not nervous/anxious and does not have insomnia.     PAST MEDICAL HISTORY :  Past Medical History:  Diagnosis Date  . Anemia   . Anxiety   . Asthma   . Cancer ([place]) 03/10/2018   Per NM PET order. Carcinoma of upper-inner quadrant of left breast in female, estrogen receptor positive .  Marland Kitchen Cancer (HCC)    LUNG  . CHF (congestive heart failure) ([place]) 1997  . CKD (chronic kidney disease)   . Depression   . Diabetes mellitus, type 2 ([place])   . Family history of breast cancer   . Family history of colon cancer   . Family history of ovarian cancer   . Family history of pancreatic cancer   . Family history of prostate cancer   . Family history of stomach cancer   . GERD (gastroesophageal reflux disease)    history of an ulcer  . Hair loss   . History of left breast cancer 05/29/14  .  History of partial hysterectomy 12/31/2016   Per patient.  Has not had a period in years.  Had a partial hysterectomy years ago.  Marland Kitchen Hypertension   . Mitral valve regurgitation   . Neuromuscular disorder (HCC)    neuropathies in hand  . Obesity   . Pancreatitis 1997  . Stroke [facility]) 2010   with mild left arm weakness    PAST SURGICAL HISTORY :   Past Surgical History:  Procedure Laterality Date  . AMPUTATION Left 03/30/2020   Procedure: AMPUTATION 5th RAY;  Surgeon: Samara Deist, DPM;  Location: [facility] ORS;  Service: Podiatry;  Laterality: Left;  . APPLICATION OF WOUND VAC Left 03/30/2020   Procedure: APPLICATION OF WOUND VAC;  Surgeon: Samara Deist, DPM;  Location: [facility] ORS;  Service: Podiatry;  Laterality: Left;  . CATARACT EXTRACTION W/PHACO Right 02/24/2019   Procedure: CATARACT EXTRACTION PHACO AND INTRAOCULAR LENS PLACEMENT ([place]) RIGHT DIABETES;  Surgeon: Marchia Meiers, MD;  Location: [facility] ORS;  Service: Ophthalmology;  Laterality: Right;  [place] 01:13.0 CDE 7.96 Fluid Pack Lot # [id number]  . CATARACT EXTRACTION W/PHACO Left 03/24/2019   Procedure: CATARACT EXTRACTION PHACO AND INTRAOCULAR LENS PLACEMENT ([facility]) - left diabetic;  Surgeon: Marchia Meiers, MD;  Location: [facility] ORS;  Service: Ophthalmology;  Laterality: Left;  [place]  01:36 CDE 13.93 Fluid pack lot # [id number]  . CENTRAL LINE INSERTION-TUNNELED N/A 04/04/2020   Procedure: CENTRAL LINE INSERTION-TUNNELED;  Surgeon: Katha Cabal, MD;  Location: [place] CV LAB;  Service:  Cardiovascular;  Laterality: N/A;  . CESAREAN SECTION    . CHOLECYSTECTOMY    . DIALYSIS/PERMA CATHETER REMOVAL N/A 05/01/2020   Procedure: DIALYSIS/PERMA CATHETER REMOVAL;  Surgeon: Katha Cabal, MD;  Location: [place] CV LAB;  Service: Cardiovascular;  Laterality: N/A;  . EXCISION OF TONGUE LESION N/A 08/17/2018   Procedure: EXCISION OF TONGUE LESION WITH FROZEN SECTIONS;  Surgeon: Beverly Gust, MD;  Location: [facility] ORS;  Service: ENT;   Laterality: N/A;  . EYE SURGERY Right    cataract extraction  . IRRIGATION AND DEBRIDEMENT FOOT Left 03/30/2020   Procedure: IRRIGATION AND DEBRIDEMENT FOOT;  Surgeon: Samara Deist, DPM;  Location: [facility] ORS;  Service: Podiatry;  Laterality: Left;  . PARTIAL HYSTERECTOMY  12/31/2016   Per patient, she has not had a period in years since she had a partial hysterectomy.  Marland Kitchen PORTA CATH INSERTION    . TUBAL LIGATION      FAMILY HISTORY :   Family History  Problem Relation Age of Onset  . Ovarian cancer Mother 23  . Diabetes Mother   . Hypertension Mother   . COPD Father   . Hypertension Father   . Colon cancer Father 69  . Diabetes Sister   . Breast cancer Sister 25       bilateral  . Diabetes Brother   . Leukemia Maternal Aunt   . Pancreatic cancer Paternal Aunt 6  . Pancreatic cancer Paternal Uncle   . Colon cancer Paternal Uncle   . Stomach cancer Maternal Grandfather 29  . Throat cancer Paternal Grandmother   . Breast cancer Maternal Aunt 80  . Colon cancer Maternal Aunt   . Bone cancer Maternal Aunt   . Breast cancer Paternal Aunt        dx >50  . Prostate cancer Paternal Uncle   . Pancreatic cancer Paternal Uncle   . Throat cancer Paternal Uncle   . Lung cancer Paternal Uncle   . Stomach cancer Paternal Uncle   . Brain cancer Paternal Aunt   . Cancer Cousin        liver, kidney  . Prostate cancer Cousin        meastatic  . Lung cancer Other     SOCIAL HISTORY:   Social History   Tobacco Use  . Smoking status: Former Smoker    Packs/day: 0.50    Years: 1.00    Pack years: 0.50    Types: Cigarettes  . Smokeless tobacco: Never Used  Vaping Use  . Vaping Use: Never used  Substance Use Topics  . Alcohol use: No    Alcohol/week: 0.0 standard drinks  . Drug use: No    ALLERGIES:  is allergic to fish-derived products, sulfamethoxazole-trimethoprim, and chlorhexidine.  MEDICATIONS:  Current Outpatient Medications  Medication Sig Dispense Refill  .  acetaminophen-codeine (TYLENOL #4) 300-60 MG tablet Take 1 tablet by mouth in the morning and at bedtime.     Marland Kitchen albuterol (PROAIR HFA) 108 (90 BASE) MCG/ACT inhaler Inhale 2 puffs into the lungs every 6 (six) hours as needed for wheezing or shortness of breath.     . ALPRAZolam (XANAX) 0.5 MG tablet Take 0.5 mg by mouth 2 (two) times daily as needed for anxiety or sleep.     Marland Kitchen amLODipine (NORVASC) 5 MG tablet     . aspirin EC 81 MG tablet Take 81 mg by mouth daily.     Marland Kitchen atenolol (TENORMIN) 50 MG tablet Take 50 mg by mouth 2 (two) times daily.     Marland Kitchen  bumetanide (BUMEX) 1 MG tablet Take 1 tablet (1 mg total) by mouth 2 (two) times daily. 60 tablet 0  . calcitRIOL (ROCALTROL) 0.25 MCG capsule Take 0.25 mcg by mouth daily.     . Cinnamon 500 MG capsule Take 500 mg by mouth 2 (two) times daily.     . cloNIDine (CATAPRES) 0.2 MG tablet Take 0.2 mg by mouth 2 (two) times daily.     Marland Kitchen docusate sodium (COLACE) 100 MG capsule Take 1 capsule (100 mg total) by mouth 2 (two) times daily. (Patient taking differently: Take 100 mg by mouth daily. ) 10 capsule 0  . enalapril (VASOTEC) 10 MG tablet Take 20 mg by mouth 2 (two) times a day.     . famotidine (PEPCID) 20 MG tablet Take 20 mg by mouth 2 (two) times daily.     . ferrous sulfate 325 (65 FE) MG tablet Take 325 mg by mouth daily with breakfast.    . FLUoxetine (PROZAC) 20 MG capsule Take 20 mg by mouth 2 (two) times daily.     . Fluticasone Propionate, Inhal, (FLOVENT DISKUS) 100 MCG/BLIST AEPB Inhale 2 puffs into the lungs in the morning and at bedtime.    . gabapentin (NEURONTIN) 100 MG capsule TAKE 1 CAPSULE(100 MG) BY MOUTH AT BEDTIME (Patient taking differently: Take 100 mg by mouth at bedtime. ) 30 capsule 6  . glyBURIDE (DIABETA) 5 MG tablet Take 10 mg by mouth 2 (two) times daily with a meal.     . LEVEMIR FLEXTOUCH 100 UNIT/ML FlexPen Inject 25 Units into the skin daily. 15 mL 11  . NOVOLOG FLEXPEN 100 UNIT/ML FlexPen Inject 5 Units into the skin 3  (three) times daily with meals. (Patient taking differently: Inject 7 Units into the skin 2 (two) times daily with a meal. ) 15 mL 11  . senna (SENOKOT) 8.6 MG TABS tablet Take 1 tablet (8.6 mg total) by mouth 2 (two) times daily as needed for mild constipation or moderate constipation. 120 tablet 0  . simvastatin (ZOCOR) 20 MG tablet Take 20 mg by mouth every evening.      No current facility-administered medications for this visit.   Facility-Administered Medications Ordered in Other Visits  Medication Dose Route Frequency Provider Last Rate Last Admin  . 0.9 %  sodium chloride infusion (Manually program via Guardrails IV Fluids)  250 mL Intravenous Once Cammie Sickle, MD      . acetaminophen (TYLENOL) tablet 650 mg  650 mg Oral Once Cammie Sickle, MD      . diphenhydrAMINE (BENADRYL) capsule 25 mg  25 mg Oral Once Cammie Sickle, MD      . DOXOrubicin HCL LIPOSOMAL (DOXIL) 30 mg in dextrose 5 % 250 mL chemo infusion  15 mg/m2 (Treatment Plan Recorded) Intravenous Once Charlaine Dalton R, MD      . heparin lock flush 100 unit/mL  500 Units Intravenous Once Charlaine Dalton R, MD      . heparin lock flush 100 unit/mL  500 Units Intracatheter Daily PRN Charlaine Dalton R, MD      . sodium chloride flush (NS) 0.9 % injection 10 mL  10 mL Intravenous PRN Cammie Sickle, MD   10 mL at 01/30/16 1054    PHYSICAL EXAMINATION: ECOG PERFORMANCE STATUS: 1 - Symptomatic but completely ambulatory  BP 133/84 (BP Location: Right Arm, Patient Position: Sitting)   Pulse 76   Temp (!) 96.5 F (35.8 C) (Tympanic)   Resp 16  Wt 166 lb (75.3 kg)   SpO2 100%   BMI 30.36 kg/m   Filed Weights   07/02/20 0835  Weight: 166 lb (75.3 kg)    Physical Exam Constitutional:      Comments: She is alone.  HENT:     Head: Normocephalic and atraumatic.     Mouth/Throat:     Pharynx: No oropharyngeal exudate.  Eyes:     Pupils: Pupils are equal, round, and reactive to  light.  Cardiovascular:     Rate and Rhythm: Normal rate and regular rhythm.  Pulmonary:     [place]: No respiratory distress.     Breath sounds: Normal breath sounds. No wheezing.  Abdominal:     General: Bowel sounds are normal. There is no distension.     Palpations: Abdomen is soft. There is no mass.     Tenderness: There is no abdominal tenderness. There is no guarding or rebound.  Musculoskeletal:        General: No tenderness. Normal range of motion.     Cervical back: Normal range of motion and neck supple.  Skin:    General: Skin is warm.     Comments: Left sole-bandage.  Neurological:     Mental Status: She is alert and oriented to person, place, and time.  Psychiatric:        Mood and Affect: Affect normal.     LABORATORY DATA:  I have reviewed the data as listed    Component Value Date/Time   NA 136 07/02/2020 0759   NA 130 (L) 06/06/2014 1102   K 4.2 07/02/2020 0759   K 3.9 06/06/2014 1102   CL 94 (L) 07/02/2020 0759   CL 95 (L) 06/06/2014 1102   CO2 31 07/02/2020 0759   CO2 28 06/06/2014 1102   GLUCOSE 208 (H) 07/02/2020 0759   GLUCOSE 349 (H) 06/06/2014 1102   BUN 46 (H) 07/02/2020 0759   BUN 17 06/06/2014 1102   CREATININE 2.44 (H) 07/02/2020 0759   CREATININE 1.63 (H) 06/06/2014 1102   CALCIUM 9.0 07/02/2020 0759   CALCIUM 9.2 06/06/2014 1102   PROT 7.8 07/02/2020 0759   PROT 8.2 06/06/2014 1102   ALBUMIN 2.6 (L) 07/02/2020 0759   ALBUMIN 3.3 (L) 06/06/2014 1102   AST 12 (L) 07/02/2020 0759   AST 7 (L) 06/06/2014 1102   ALT 9 07/02/2020 0759   ALT 12 (L) 06/06/2014 1102   ALKPHOS 65 07/02/2020 0759   ALKPHOS 74 06/06/2014 1102   BILITOT 0.5 07/02/2020 0759   BILITOT 0.4 06/06/2014 1102   GFRNONAA 22 (L) 07/02/2020 0759   GFRNONAA 35 (L) 06/06/2014 1102   GFRAA 24 (L) 05/07/2020 0807   GFRAA 42 (L) 06/06/2014 1102    No results found for: SPEP, UPEP  Lab Results  Component Value Date   WBC 11.2 (H) 07/02/2020   NEUTROABS 9.1 (H)  07/02/2020   HGB 7.6 (L) 07/02/2020   HCT 23.3 (L) 07/02/2020   MCV 91.0 07/02/2020   PLT 286 07/02/2020      Chemistry      Component Value Date/Time   NA 136 07/02/2020 0759   NA 130 (L) 06/06/2014 1102   K 4.2 07/02/2020 0759   K 3.9 06/06/2014 1102   CL 94 (L) 07/02/2020 0759   CL 95 (L) 06/06/2014 1102   CO2 31 07/02/2020 0759   CO2 28 06/06/2014 1102   BUN 46 (H) 07/02/2020 0759   BUN 17 06/06/2014 1102   CREATININE 2.44 (H)  07/02/2020 0759   CREATININE 1.63 (H) 06/06/2014 1102      Component Value Date/Time   CALCIUM 9.0 07/02/2020 0759   CALCIUM 9.2 06/06/2014 1102   ALKPHOS 65 07/02/2020 0759   ALKPHOS 74 06/06/2014 1102   AST 12 (L) 07/02/2020 0759   AST 7 (L) 06/06/2014 1102   ALT 9 07/02/2020 0759   ALT 12 (L) 06/06/2014 1102   BILITOT 0.5 07/02/2020 0759   BILITOT 0.4 06/06/2014 1102         RADIOGRAPHIC STUDIES: I have personally reviewed the radiological images as listed and agreed with the findings in the report. No results found.   ASSESSMENT & PLAN:  Carcinoma of upper-inner quadrant of left breast in female, estrogen receptor positive ([place]) # stage IV breast cancer ER/PR positive HER-2 negative -October 30 CT scan-progression of disease-pleural metastases/bilateral effusion; worsening lytic lesions in the bones.  Currently on weekly DOXIL.  # Proceed with #3 weekly DOXIL today. Labs today reviewed;  acceptable for treatment today except for Hb 7.6; see below   # Bilateral pleural effusion status post thoracentesis [OCT 2021]-cytology negative. Monitor closely. Patient on O2 nasal cannula.STABLE.  # Anemia- sec to chemo-CKD- IV- hemoglobin-7.6; plan  PRBC transfusion today.    # Chronic kidney disease - stage IV [GFR-22]- STABLE; Recent evaluation with Dr. Juleen China.  Monitor closely if continued get worse would recommend tapering the dose of the diuretics.  # Bone mets-sclerotic; Right acetabular uptake-s/p radiation; CT scan shows worsening  metastases in the vertebrae; currently asymptomatic-STABLE  # DISPOSITION:  # DOXIL today; 1 unit PRBC  today #  Dec 10th- MD; labs- cbc/cmp;ca-27-29;-DOXIL; HOLD tube # Dec 17th- MD; labs- cbc/cmp;ca-27-29;-DOXIL; HOLD tube;- Dr.B     Orders Placed This Encounter  Procedures  . CBC with Differential    Standing Status:   Future    Standing Expiration Date:   07/02/2021  . Comprehensive metabolic panel    Standing Status:   Future    Standing Expiration Date:   07/02/2021  . Cancer antigen 27.29    Standing Status:   Future    Standing Expiration Date:   07/02/2021  . CBC with Differential    Standing Status:   Future    Standing Expiration Date:   07/02/2021  . Comprehensive metabolic panel    Standing Status:   Future    Standing Expiration Date:   07/02/2021  . Cancer antigen 27.29    Standing Status:   Future    Standing Expiration Date:   07/02/2021  . Hold Tube- Blood Bank    Standing Status:   Future    Standing Expiration Date:   07/02/2021  . Hold Tube- Blood Bank    Standing Status:   Future    Standing Expiration Date:   07/02/2021   All questions were answered. The patient knows to call the clinic with any problems, questions or concerns.      Cammie Sickle, MD 07/02/2020 10:29 AM

## 2020-07-02 NOTE — Assessment & Plan Note (Addendum)
#  stage IV breast cancer ER/PR positive HER-2 negative -October 30 CT scan-progression of disease-pleural metastases/bilateral effusion; worsening lytic lesions in the bones.  Currently on weekly DOXIL.  # Proceed with #3 weekly DOXIL today. Labs today reviewed;  acceptable for treatment today except for Hb 7.6; see below   # Bilateral pleural effusion status post thoracentesis [OCT 2021]-cytology negative. Monitor closely. Patient on O2 nasal cannula.STABLE.  # Anemia- sec to chemo-CKD- IV- hemoglobin-7.6; plan  PRBC transfusion today.    # Chronic kidney disease - stage IV [GFR-22]- STABLE; Recent evaluation with Dr. Juleen China.  Monitor closely if continued get worse would recommend tapering the dose of the diuretics.  # Bone mets-sclerotic; Right acetabular uptake-s/p radiation; CT scan shows worsening metastases in the vertebrae; currently asymptomatic-STABLE  # DISPOSITION:  # DOXIL today; 1 unit PRBC  today #  Dec 10th- MD; labs- cbc/cmp;ca-27-29;-DOXIL; HOLD tube # Dec 17th- MD; labs- cbc/cmp;ca-27-29;-DOXIL; HOLD tube;- Dr.B

## 2020-07-02 NOTE — Progress Notes (Signed)
Patient to receive 1 unit PRBC and chemo per Jonne Ply., RN  Patient tolerated treatment well. Patient and VSS. Discharged home.

## 2020-07-03 ENCOUNTER — Other Ambulatory Visit: Payer: Self-pay

## 2020-07-03 ENCOUNTER — Encounter: Payer: Medicare HMO | Admitting: Physician Assistant

## 2020-07-03 DIAGNOSIS — L97512 Non-pressure chronic ulcer of other part of right foot with fat layer exposed: Secondary | ICD-10-CM | POA: Diagnosis not present

## 2020-07-03 DIAGNOSIS — E11621 Type 2 diabetes mellitus with foot ulcer: Secondary | ICD-10-CM | POA: Diagnosis not present

## 2020-07-03 DIAGNOSIS — I110 Hypertensive heart disease with heart failure: Secondary | ICD-10-CM | POA: Diagnosis not present

## 2020-07-03 DIAGNOSIS — L97411 Non-pressure chronic ulcer of right heel and midfoot limited to breakdown of skin: Secondary | ICD-10-CM | POA: Diagnosis not present

## 2020-07-03 DIAGNOSIS — L97523 Non-pressure chronic ulcer of other part of left foot with necrosis of muscle: Secondary | ICD-10-CM | POA: Diagnosis not present

## 2020-07-03 DIAGNOSIS — L97528 Non-pressure chronic ulcer of other part of left foot with other specified severity: Secondary | ICD-10-CM | POA: Diagnosis not present

## 2020-07-03 DIAGNOSIS — I5042 Chronic combined systolic (congestive) and diastolic (congestive) heart failure: Secondary | ICD-10-CM | POA: Diagnosis not present

## 2020-07-03 LAB — TYPE AND SCREEN
ABO/RH(D): A POS
Antibody Screen: NEGATIVE
Unit division: 0

## 2020-07-03 LAB — BPAM RBC
Blood Product Expiration Date: 202112162359
ISSUE DATE / TIME: 202111291158
Unit Type and Rh: 6200

## 2020-07-03 LAB — CANCER ANTIGEN 27.29: CA 27.29: 329.9 U/mL — ABNORMAL HIGH (ref 0.0–38.6)

## 2020-07-03 NOTE — Progress Notes (Addendum)
NATASA, [name] ([id number]) Visit Report for 07/03/2020 Chief Complaint Document Details Patient Name: [name], [name] Date of Service: 07/03/2020 11:00 AM Medical Record Number: [id number] Patient Account Number: [id number] Date of Birth/Sex: 07/03/1956 (64 y.o. F) Treating RN: Cornell Barman Primary Care Provider: [name] Other Clinician: Referring Provider: [name] Treating Provider/Extender: Skipper Cliche in Treatment: 8 Information Obtained from: Patient Chief Complaint Left foot surgical ulcer Electronic Signature(s) Signed: 07/03/2020 11:23:43 AM By: Worthy Keeler PA-C Entered By: Worthy Keeler on 07/03/2020 11:23:43 Arkin, [name] ([id number]) -------------------------------------------------------------------------------- Debridement Details Patient Name: Gloria Both A. Date of Service: 07/03/2020 11:00 AM Medical Record Number: [id number] Patient Account Number: [id number] Date of Birth/Sex: [birth year] (64 y.o. F) Treating RN: Cornell Barman Primary Care Provider: [name] Other Clinician: Referring Provider: [name] Treating Provider/Extender: Skipper Cliche in Treatment: 8 Debridement Performed for Wound #5 Right,Plantar Calcaneus Assessment: Performed By: Physician Tommie Sams., PA-C Debridement Type: Debridement Severity of Tissue Pre Debridement: Limited to breakdown of skin Level of Consciousness (Pre- Awake and Alert procedure): Pre-procedure Verification/Time Out Yes - 11:30 Taken: Total Area Debrided (L x W): 1 (cm) x 1 (cm) = 1 (cm) Tissue and other material Viable, Non-Viable, Callus, Skin: Dermis , Skin: Epidermis, Biofilm debrided: Level: Skin/Epidermis Debridement Description: Selective/Open Wound Instrument: Curette Bleeding: None Response to Treatment: Procedure was tolerated well Level of Consciousness (Post- Awake and Alert procedure): Post Debridement Measurements of Total Wound Length:  (cm) 0.7 Width: (cm) 0.9 Depth: (cm) 0.1 Volume: (cm) 0.049 Character of Wound/Ulcer Post Debridement: Stable Severity of Tissue Post Debridement: Limited to breakdown of skin Post Procedure Diagnosis Same as Pre-procedure Electronic Signature(s) Signed: 07/03/2020 12:33:07 PM By: Gretta Cool, BSN, RN, CWS, Kim RN, BSN Signed: 07/03/2020 5:00:58 PM By: Worthy Keeler PA-C Entered By: Gretta Cool, BSN, RN, CWS, Kim on 07/03/2020 12:33:06 Mcgough, Roneka AMarland Kitchen ([id number]) -------------------------------------------------------------------------------- Debridement Details Patient Name: Gloria Both A. Date of Service: 07/03/2020 11:00 AM Medical Record Number: [id number] Patient Account Number: [id number] Date of Birth/Sex: 07/09/1956 (64 y.o. F) Treating RN: Cornell Barman Primary Care Provider: [name] Other Clinician: Referring Provider: [name] Treating Provider/Extender: Skipper Cliche in Treatment: 8 Debridement Performed for Wound #4 Left,Lateral Foot Assessment: Performed By: Physician Tommie Sams., PA-C Debridement Type: Debridement Severity of Tissue Pre Debridement: Fat layer exposed Level of Consciousness (Pre- Awake and Alert procedure): Pre-procedure Verification/Time Out Yes - 11:30 Taken: Total Area Debrided (L x W): 6 (cm) x 0.4 (cm) = 2.4 (cm) Tissue and other material Viable, Non-Viable, Slough, Subcutaneous, Tendon, Slough debrided: Level: Skin/Subcutaneous Tissue/Muscle Debridement Description: Excisional Instrument: Curette Bleeding: None Response to Treatment: Procedure was tolerated well Level of Consciousness (Post- Awake and Alert procedure): Post Debridement Measurements of Total Wound Length: (cm) 6 Width: (cm) 1 Depth: (cm) 1.7 Volume: (cm) 8.011 Character of Wound/Ulcer Post Debridement: Stable Severity of Tissue Post Debridement: Other severity specified Post Procedure Diagnosis Same as Pre-procedure Notes necrosis of  tendon Electronic Signature(s) Signed: 07/03/2020 12:36:53 PM By: Gretta Cool, BSN, RN, CWS, Kim RN, BSN Signed: 07/03/2020 5:00:58 PM By: Worthy Keeler PA-C Entered By: Gretta Cool, BSN, RN, CWS, Kim on 07/03/2020 12:36:53 Nyquist, [name] ([id number]) -------------------------------------------------------------------------------- HPI Details Patient Name: Gloria Both A. Date of Service: 07/03/2020 11:00 AM Medical Record Number: [id number] Patient Account Number: [id number] Date of Birth/Sex: [birth year] (64 y.o. F) Treating RN: Cornell Barman Primary Care Provider: [name] Other Clinician: Referring Provider: [name] Treating Provider/Extender: Skipper Cliche in Treatment: 8 History of Present Illness HPI  Description: 12/27/2019 upon evaluation today patient appears to be doing somewhat poorly upon initial inspection here in the clinic. She has unfortunately been having issues with for the past week a blister over her right heel that started on the plantar aspect and has spread to the medial aspect. She does have a history of diabetes mellitus type 2 she also has hypertension along with congestive heart failure. With that being said she was supposed to be having knee surgery on her right knee but this was postponed by the surgeon and she was referred to [place] due to the blister noted. Fortunately there is no signs of active infection at this time. With that being said I am concerned about the fact that this could develop into infection. Obviously we want to prevent such from happening. She does note that she been placed on a antibiotic for urinary tract infection by her primary care provider she has that to pick up. I asked her to call and let us know what that antibiotic was so that we can put that in the chart. For that reason I am not can give her anything empirically at this point. The patient cannot remember any injury that she had to this region in fact she really does not know  how this began at all other than the fact that it "just showed up". 01/05/2020 upon evaluation today patient appears to be doing about the same in regard to her heel ulcer. She has been tolerating the dressing changes without complication. Fortunately there is no signs of active infection at this time. No fevers, chills, nausea, vomiting, or diarrhea. With that being said I do believe the skin is getting need to be removed from the heel where this is somewhat deflated there is still a lot of blood collecting underneath and I think this is not can I do her any good to be perfectly honest. Plus we do not really know exactly what everything looks like underneath as far as the actual wound is concerned. Obviously we need to figure that out. 01/12/2020 upon evaluation today patient's wound actually appears to be doing excellent in fact this is very close to complete resolution. Subsequently I also did send a culture after removing the blood blister last week and the patient did not have any bacteria noted just normal skin flora and no growth otherwise after 2 days. Fortunately there is no signs of active infection at this time systemically or locally. Overall I feel that she is doing excellent. 01/19/2020 upon evaluation today patient appears to be doing excellent at this time in regard to her heel ulcer for the most part. The one issue that we do see is that she is having a new blistered area that amount to clear away some of the skin from today. This seems to be due to her foot slipping in the offloading shoe that she currently is utilizing. Fortunately there is no signs of active infection at this time. No fevers, chills, nausea, vomiting, or diarrhea. 01/26/2020 upon evaluation today patient's wound actually showed signs of excellent improvement. She has made great progress even since last week's visit. I do believe that she continues to tolerate the dressing changes without any complication and I am very  pleased with that as well. In general I think that we will likely continue with the current measures as long as we are continuing to see the results that we are at this point. 02/02/2020 upon evaluation today patient appears to be doing excellent  in regard to her wounds currently. She has been tolerating the dressing changes without complication in fact this appears to be completely healed today based on what I am seeing. Readmission: 03/26/2020 on evaluation today patient presents for readmission concerning an issue that she is actually having with her opposite foot compared to the one that I treated last time she was with this back in July when she healed. Subsequently she tells me that she really does not know how this came up. She does not have any particular injury that she is aware of at this point. With that being said she tells me that the area came up somewhat unexpectedly and has been dark and similar to what she had before. She has had some drainage from it as well. She has been on Keflex initially for urinary tract infection then her doctor actually gave her a second round due to the heel itself in order to try to help with this. Nonetheless she still has erythema and warmth of the foot I am concerned that the Keflex is not the appropriate medication. Previously we used Augmentin but that is very similar to the Keflex to be honest. I am not really sure if that would be the best option for her at this time. Nonetheless she probably needs something oral while we await the results of the culture and then if we need to make any adjustments or changes we can do so at that point. I do need to keep her kidney function in consideration with this. 05/04/2020 upon evaluation today patient presents for reevaluation here in the clinic after having had surgery as performed by Dr. Vickki Muff. She actually goes back to see him this coming week on October 5. She still has sutures in place at the moment currently  he has recommended Betadine to the wound area followed by dry gauze dressing and cleansing with saline. Her surgery was roughly 4 weeks ago. She tells me that she is really not having any significant pain which is great news. Overall I am pleased with where things stand. I do believe that once the sutures are out this may potentially slightly dehisced nonetheless I think that we should be able to hopefully get this to close effectively without much complication. However time will tell we will see what happens once the sutures are indeed removed. 05/11/2020 upon evaluation today patient appears to be doing decently well in regard to her foot ulcer. Fortunately there is no signs of active infection at this time. Actually appears to be healing okay and the sutures have been removed at this point. I do believe talking the alginate into the crease where this is healing will be appropriate to try to keep this area nice and clean and allow it to hopefully continue to heal. Fortunately there is no signs of active infection. 05/29/2020 upon evaluation today patient appears to be doing excellent in regard to her foot ulcer all things considered she does have some necrotic tissue in the central portion of the wound but fortunately this does not seem to be a significant issue at this point. Overall I am extremely pleased with how the patient in general seems to be progressing. 06/12/2020 upon evaluation today patient appears to be doing well in regard to her foot ulcer all things considered. She was in the hospital due to a lung infection although she tells me she is doing better now in the hospital they did change her dressing and even the surgeon's physician assistant did  come by and see her while in the hospital. Overall they are still pleased with where things stand. Nonetheless I still think this can take quite some time to get this wound to completely close. That again was discussed with the patient today  although I do feel like she is making Grumbine, Khilee A. ([id number]) progress. 07/03/2020 on evaluation today patient's right heel unfortunately appears to have reopened and what appears to be a pressure injury/deep tissue injury currently. With that being said I do think also that her left foot is still struggling as far as healing is concerned she did see Dr. Vickki Muff and he according to the patient does not want to proceed with any further surgery to try to close the foot due to everything else she has going on with regard to her cancer and lungs in general. She is also had a lot of issues with lung infections. Nonetheless I completely understand this. For that reason he wants to try to allow this to heal by second intent with the use and help of the wound care center here according to the patient. Obviously we will try to do what we can though she does have quite a deep area of depth to the wound on the proximal aspect in particular. Electronic Signature(s) Signed: 07/03/2020 3:34:25 PM By: Worthy Keeler PA-C Entered By: Worthy Keeler on 07/03/2020 15:34:24 Butz, [name] ([id number]) -------------------------------------------------------------------------------- Physical Exam Details Patient Name: Gloria Both A. Date of Service: 07/03/2020 11:00 AM Medical Record Number: [id number] Patient Account Number: [id number] Date of Birth/Sex: 1956/01/23 (64 y.o. F) Treating RN: Cornell Barman Primary Care Provider: [name] Other Clinician: Referring Provider: [name] Treating Provider/Extender: Skipper Cliche in Treatment: 8 Constitutional Well-nourished and well-hydrated in no acute distress. Respiratory normal breathing without difficulty. Psychiatric this patient is able to make decisions and demonstrates good insight into disease process. Alert and Oriented x 3. pleasant and cooperative. Notes Upon inspection patient's wound on the right heel appears to have  reopened at this time unfortunately I do believe this is a deep tissue injury/pressure injury. She also has an area of blistering along the lateral aspect of her left foot although this is not appearing to be connected to the main wound. The main wound I did have to perform sharp debridement in regard to try to clear away some of the necrotic tissue she tolerated that without complication today. Post debridement wound bed appears to be doing much better. Electronic Signature(s) Signed: 07/03/2020 3:35:44 PM By: Worthy Keeler PA-C Entered By: Worthy Keeler on 07/03/2020 15:35:44 Jarriel, [name] ([id number]) -------------------------------------------------------------------------------- Physician Orders Details Patient Name: Gloria Both A. Date of Service: 07/03/2020 11:00 AM Medical Record Number: [id number] Patient Account Number: [id number] Date of Birth/Sex: Dec 18, 1955 (64 y.o. F) Treating RN: Cornell Barman Primary Care Provider: [name] Other Clinician: Referring Provider: [name] Treating Provider/Extender: Skipper Cliche in Treatment: 8 Verbal / Phone Orders: No Diagnosis Coding ICD-10 Coding Code Description E11.621 Type 2 diabetes mellitus with foot ulcer L97.523 Non-pressure chronic ulcer of other part of left foot with necrosis of muscle I10 Essential (primary) hypertension I50.42 Chronic combined systolic (congestive) and diastolic (congestive) heart failure Wound Cleansing Wound #4 Left,Lateral Foot o Dial antibacterial soap, wash wounds, rinse and pat dry prior to dressing wounds Wound #5 Right,Plantar Calcaneus o Dial antibacterial soap, wash wounds, rinse and pat dry prior to dressing wounds Primary Wound Dressing Wound #4 Left,Lateral Foot o Silver Alginate Wound #5 Right,Plantar Calcaneus o Silver  Alginate Secondary Dressing Wound #4 Left,Lateral Foot o ABD and Kerlix/Conform o Other - ace wrap to hold all in place Wound #5  Right,Plantar Calcaneus o Other - Heel cup secured with conform and tape Dressing Change Frequency Wound #4 Left,Lateral Foot o Change Dressing Monday, Wednesday, Friday Wound #5 Right,Plantar Calcaneus o Change Dressing Monday, Wednesday, Friday Follow-up Appointments Wound #4 Left,Lateral Foot o Return Appointment in 2 weeks. Off-Loading Wound #4 Left,Lateral Foot o Open toe surgical shoe Electronic Signature(s) Signed: 07/03/2020 12:53:16 PM By: Gretta Cool, BSN, RN, CWS, Kim RN, BSN Signed: 07/03/2020 5:00:58 PM By: Worthy Keeler PA-C Entered By: Gretta Cool, BSN, RN, CWS, Kim on 07/03/2020 12:53:16 Umbach, [name] ([id number]) -------------------------------------------------------------------------------- Problem List Details Patient Name: Gloria Both A. Date of Service: 07/03/2020 11:00 AM Medical Record Number: [id number] Patient Account Number: [id number] Date of Birth/Sex: 1956/03/30 (64 y.o. F) Treating RN: Cornell Barman Primary Care Provider: [name] Other Clinician: Referring Provider: [name] Treating Provider/Extender: Skipper Cliche in Treatment: 8 Active Problems ICD-10 Encounter Code Description Active Date MDM Diagnosis E11.621 Type 2 diabetes mellitus with foot ulcer 05/04/2020 No Yes L97.523 Non-pressure chronic ulcer of other part of left foot with necrosis of 05/04/2020 No Yes muscle L97.512 Non-pressure chronic ulcer of other part of right foot with fat layer 07/03/2020 No Yes exposed I10 Essential (primary) hypertension 05/04/2020 No Yes I50.42 Chronic combined systolic (congestive) and diastolic (congestive) heart 05/04/2020 No Yes failure Inactive Problems Resolved Problems Electronic Signature(s) Signed: 07/03/2020 3:38:35 PM By: Worthy Keeler PA-C Previous Signature: 07/03/2020 11:23:37 AM Version By: Worthy Keeler PA-C Entered By: Worthy Keeler on 07/03/2020 15:38:34 Reh, Debar AMarland Kitchen  ([id number]) -------------------------------------------------------------------------------- Progress Note Details Patient Name: Gloria Both A. Date of Service: 07/03/2020 11:00 AM Medical Record Number: [id number] Patient Account Number: [id number] Date of Birth/Sex: July 15, 1956 (64 y.o. F) Treating RN: Cornell Barman Primary Care Provider: [name] Other Clinician: Referring Provider: [name] Treating Provider/Extender: Skipper Cliche in Treatment: 8 Subjective Chief Complaint Information obtained from Patient Left foot surgical ulcer History of Present Illness (HPI) 12/27/2019 upon evaluation today patient appears to be doing somewhat poorly upon initial inspection here in the clinic. She has unfortunately been having issues with for the past week a blister over her right heel that started on the plantar aspect and has spread to the medial aspect. She does have a history of diabetes mellitus type 2 she also has hypertension along with congestive heart failure. With that being said she was supposed to be having knee surgery on her right knee but this was postponed by the surgeon and she was referred to [place] due to the blister noted. Fortunately there is no signs of active infection at this time. With that being said I am concerned about the fact that this could develop into infection. Obviously we want to prevent such from happening. She does note that she been placed on a antibiotic for urinary tract infection by her primary care provider she has that to pick up. I asked her to call and let us know what that antibiotic was so that we can put that in the chart. For that reason I am not can give her anything empirically at this point. The patient cannot remember any injury that she had to this region in fact she really does not know how this began at all other than the fact that it "just showed up". 01/05/2020 upon evaluation today patient appears to be doing about the same in  regard to  her heel ulcer. She has been tolerating the dressing changes without complication. Fortunately there is no signs of active infection at this time. No fevers, chills, nausea, vomiting, or diarrhea. With that being said I do believe the skin is getting need to be removed from the heel where this is somewhat deflated there is still a lot of blood collecting underneath and I think this is not can I do her any good to be perfectly honest. Plus we do not really know exactly what everything looks like underneath as far as the actual wound is concerned. Obviously we need to figure that out. 01/12/2020 upon evaluation today patient's wound actually appears to be doing excellent in fact this is very close to complete resolution. Subsequently I also did send a culture after removing the blood blister last week and the patient did not have any bacteria noted just normal skin flora and no growth otherwise after 2 days. Fortunately there is no signs of active infection at this time systemically or locally. Overall I feel that she is doing excellent. 01/19/2020 upon evaluation today patient appears to be doing excellent at this time in regard to her heel ulcer for the most part. The one issue that we do see is that she is having a new blistered area that amount to clear away some of the skin from today. This seems to be due to her foot slipping in the offloading shoe that she currently is utilizing. Fortunately there is no signs of active infection at this time. No fevers, chills, nausea, vomiting, or diarrhea. 01/26/2020 upon evaluation today patient's wound actually showed signs of excellent improvement. She has made great progress even since last week's visit. I do believe that she continues to tolerate the dressing changes without any complication and I am very pleased with that as well. In general I think that we will likely continue with the current measures as long as we are continuing to see the  results that we are at this point. 02/02/2020 upon evaluation today patient appears to be doing excellent in regard to her wounds currently. She has been tolerating the dressing changes without complication in fact this appears to be completely healed today based on what I am seeing. Readmission: 03/26/2020 on evaluation today patient presents for readmission concerning an issue that she is actually having with her opposite foot compared to the one that I treated last time she was with this back in July when she healed. Subsequently she tells me that she really does not know how this came up. She does not have any particular injury that she is aware of at this point. With that being said she tells me that the area came up somewhat unexpectedly and has been dark and similar to what she had before. She has had some drainage from it as well. She has been on Keflex initially for urinary tract infection then her doctor actually gave her a second round due to the heel itself in order to try to help with this. Nonetheless she still has erythema and warmth of the foot I am concerned that the Keflex is not the appropriate medication. Previously we used Augmentin but that is very similar to the Keflex to be honest. I am not really sure if that would be the best option for her at this time. Nonetheless she probably needs something oral while we await the results of the culture and then if we need to make any adjustments or changes we can do so  at that point. I do need to keep her kidney function in consideration with this. 05/04/2020 upon evaluation today patient presents for reevaluation here in the clinic after having had surgery as performed by Dr. Vickki Muff. She actually goes back to see him this coming week on October 5. She still has sutures in place at the moment currently he has recommended Betadine to the wound area followed by dry gauze dressing and cleansing with saline. Her surgery was roughly 4 weeks ago.  She tells me that she is really not having any significant pain which is great news. Overall I am pleased with where things stand. I do believe that once the sutures are out this may potentially slightly dehisced nonetheless I think that we should be able to hopefully get this to close effectively without much complication. However time will tell we will see what happens once the sutures are indeed removed. 05/11/2020 upon evaluation today patient appears to be doing decently well in regard to her foot ulcer. Fortunately there is no signs of active infection at this time. Actually appears to be healing okay and the sutures have been removed at this point. I do believe talking the alginate into the crease where this is healing will be appropriate to try to keep this area nice and clean and allow it to hopefully continue to heal. Fortunately there is no signs of active infection. 05/29/2020 upon evaluation today patient appears to be doing excellent in regard to her foot ulcer all things considered she does have some necrotic tissue in the central portion of the wound but fortunately this does not seem to be a significant issue at this point. Overall I am extremely pleased with how the patient in general seems to be progressing. Kruckenberg, Margret A. ([id number]) 06/12/2020 upon evaluation today patient appears to be doing well in regard to her foot ulcer all things considered. She was in the hospital due to a lung infection although she tells me she is doing better now in the hospital they did change her dressing and even the surgeon's physician assistant did come by and see her while in the hospital. Overall they are still pleased with where things stand. Nonetheless I still think this can take quite some time to get this wound to completely close. That again was discussed with the patient today although I do feel like she is making progress. 07/03/2020 on evaluation today patient's right heel unfortunately  appears to have reopened and what appears to be a pressure injury/deep tissue injury currently. With that being said I do think also that her left foot is still struggling as far as healing is concerned she did see Dr. Vickki Muff and he according to the patient does not want to proceed with any further surgery to try to close the foot due to everything else she has going on with regard to her cancer and lungs in general. She is also had a lot of issues with lung infections. Nonetheless I completely understand this. For that reason he wants to try to allow this to heal by second intent with the use and help of the wound care center here according to the patient. Obviously we will try to do what we can though she does have quite a deep area of depth to the wound on the proximal aspect in particular. Objective Constitutional Well-nourished and well-hydrated in no acute distress. Vitals Time Taken: 11:17 AM, Height: 62 in, Weight: 196 lbs, BMI: 35.8, Temperature: 97.8 F, Pulse: 84 bpm, Respiratory  Rate: 20 breaths/min, Blood Pressure: 166/89 mmHg, Pulse Oximetry: 90 %. Respiratory normal breathing without difficulty. Psychiatric this patient is able to make decisions and demonstrates good insight into disease process. Alert and Oriented x 3. pleasant and cooperative. General Notes: Upon inspection patient's wound on the right heel appears to have reopened at this time unfortunately I do believe this is a deep tissue injury/pressure injury. She also has an area of blistering along the lateral aspect of her left foot although this is not appearing to be connected to the main wound. The main wound I did have to perform sharp debridement in regard to try to clear away some of the necrotic tissue she tolerated that without complication today. Post debridement wound bed appears to be doing much better. Integumentary (Hair, Skin) Wound #4 status is Open. Original cause of wound was Surgical Injury. The wound  is located on the Left,Lateral Foot. The wound measures 6cm length x 0.4cm width x 1.7cm depth; 1.885cm^2 area and 3.204cm^3 volume. There is Fat Layer (Subcutaneous Tissue) exposed. There is no tunneling or undermining noted. There is a large amount of serous drainage noted. The wound margin is distinct with the outline attached to the wound base. There is medium (34-66%) pink granulation within the wound bed. There is a medium (34-66%) amount of necrotic tissue within the wound bed including Adherent Slough. Wound #5 status is Open. Original cause of wound was Pressure Injury. The wound is located on the Right,Plantar Calcaneus. The wound measures 0.7cm length x 0.9cm width x 0.1cm depth; 0.495cm^2 area and 0.049cm^3 volume. There is a none present amount of drainage noted. The wound margin is flat and intact. There is no granulation within the wound bed. There is a large (67-100%) amount of necrotic tissue within the wound bed including Eschar. 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 ([id number]) Wound #4 Pre-procedure diagnosis of Wound #4 is a Diabetic Wound/Ulcer of the Lower Extremity located on the Left,Lateral Foot .Severity of Tissue Pre Debridement is: Fat layer exposed. There was a Excisional Skin/Subcutaneous Tissue/Muscle Debridement with a total area of 2.4 sq cm performed by Tommie Sams., PA-C. With the following instrument(s): Curette to remove Viable and Non-Viable tissue/material. Material removed includes Tendon, Subcutaneous Tissue, and Slough. No specimens were taken. A time out was conducted at 11:30, prior to the start of the procedure. There  was no bleeding. The procedure was tolerated well. Post Debridement Measurements: 6cm length x 1cm width x 1.7cm depth; 8.011cm^3 volume. Character of Wound/Ulcer Post Debridement is stable. Severity of Tissue Post Debridement is: Other severity specified. Post procedure Diagnosis Wound #4: Same as Pre-Procedure General Notes: necrosis of tendon. Wound #5 Pre-procedure diagnosis of Wound #5 is a Diabetic Wound/Ulcer of the Lower Extremity located on the Right,Plantar Calcaneus .Severity of Tissue Pre Debridement is: Limited to breakdown of skin. There was a Selective/Open Wound Skin/Epidermis Debridement with a total area of 1 sq cm performed by Tommie Sams., PA-C. With the following instrument(s): Curette to remove Viable and Non-Viable tissue/material. Material removed includes Callus, Skin: Dermis, Skin: Epidermis, and Biofilm. No specimens were taken. A time out was conducted at 11:30, prior to the start of the procedure. There was no bleeding. The procedure was  tolerated well. Post Debridement Measurements: 0.7cm length x 0.9cm width x 0.1cm depth; 0.049cm^3 volume. Character of Wound/Ulcer Post Debridement is stable. Severity of Tissue Post Debridement is: Limited to breakdown of skin. Post procedure Diagnosis Wound #5: Same as Pre-Procedure Plan Wound Cleansing: Wound #4 Left,Lateral Foot: Dial antibacterial soap, wash wounds, rinse and pat dry prior to dressing wounds Wound #5 Right,Plantar Calcaneus: Dial antibacterial soap, wash wounds, rinse and pat dry prior to dressing wounds Primary Wound Dressing: Wound #4 Left,Lateral Foot: Silver Alginate Wound #5 Right,Plantar Calcaneus: Silver Alginate Secondary Dressing: Wound #4 Left,Lateral Foot: ABD and Kerlix/Conform Other - ace wrap to hold all in place Wound #5 Right,Plantar Calcaneus: Other - Heel cup secured with conform and tape Dressing Change Frequency: Wound #4 Left,Lateral Foot: Change Dressing Monday, Wednesday,  Friday Wound #5 Right,Plantar Calcaneus: Change Dressing Monday, Wednesday, Friday Follow-up Appointments: Wound #4 Left,Lateral Foot: Return Appointment in 2 weeks. Off-Loading: Wound #4 Left,Lateral Foot: Open toe surgical shoe 1. Would recommend currently going to continue with the wound care measures for the patient is in agreement with plan. Specifically we are going to stick with the silver alginate dressing to all wound locations. 2. I am also can recommend currently that the patient needs to be careful about appropriate offloading I know she is not standing applying pressure but even when she is sitting or lying down she is to watch out for excessive pressure and not repositioning as this can cause other issues currently as well. 3. I am also can recommend at this time that the patient continue to use a heel cup for pressure relief on the right plantar heel. There is a give some pressure relief as well. 4. I am also can recommend that she continue with the postop surgical shoes to try to keep pressure off of both feet obviously she is not putting any weightbearing anyway which is also good news. We will see patient back for reevaluation in 2 weeks here in the clinic. If anything worsens or changes patient will contact our office for additional recommendations. Electronic Signature(s) Signed: 07/03/2020 3:38:48 PM By: Worthy Keeler PA-C Previous Signature: 07/03/2020 3:37:04 PM Version By: Haydee Monica, Berdia AMarland Kitchen ([id number]) Entered By: Worthy Keeler on 07/03/2020 15:38:48 Albaugh, [name] ([id number]) -------------------------------------------------------------------------------- SuperBill Details Patient Name: Gloria Both A. Date of Service: 07/03/2020 Medical Record Number: [id number] Patient Account Number: [id number] Date of Birth/Sex: Mar 01, 1956 (64 y.o. F) Treating RN: Cornell Barman Primary Care Provider: [name] Other Clinician: Referring  Provider: [name] Treating Provider/Extender: Skipper Cliche in Treatment: 8 Diagnosis Coding ICD-10 Codes Code Description E11.621 Type 2 diabetes mellitus with foot ulcer L97.523 Non-pressure chronic ulcer of other part of left foot with necrosis of muscle L97.512 Non-pressure chronic ulcer of other part of right foot with fat layer exposed I10 Essential (primary) hypertension I50.42 Chronic combined systolic (congestive) and diastolic (congestive) heart failure Facility Procedures CPT4 Code: 70350093 Description: 11043 - DEB MUSC/FASCIA 20 SQ CM/< Modifier: Quantity: 1 CPT4 Code: Description: ICD-10 Diagnosis Description L97.523 Non-pressure chronic ulcer of other part of left foot with necrosis of mu Modifier: scle Quantity: CPT4 Code: 81829937 Description: 97597 - DEBRIDE WOUND 1ST 20 SQ CM OR < Modifier: Quantity: 1 CPT4 Code: Description: ICD-10 Diagnosis Description L97.512 Non-pressure chronic ulcer of other part of right foot with fat layer exp Modifier: osed Quantity: Physician Procedures CPT4 Code: 1696789 Description: 11043 - WC PHYS DEBR MUSCLE/FASCIA 20 SQ CM Modifier: Quantity: 1 CPT4 Code: Description: ICD-10 Diagnosis  Description L97.523 Non-pressure chronic ulcer of other part of left foot with necrosis of mus Modifier: cle Quantity: CPT4 Code: 7276184 Description: 85927 - WC PHYS DEBR WO ANESTH 20 SQ CM Modifier: Quantity: 1 CPT4 Code: Description: ICD-10 Diagnosis Description G39.432 Non-pressure chronic ulcer of other part of right foot with fat layer expo Modifier: sed Quantity: Electronic Signature(s) Signed: 07/03/2020 3:39:06 PM By: Worthy Keeler PA-C Entered By: Worthy Keeler on 07/03/2020 15:39:05

## 2020-07-04 DIAGNOSIS — C50919 Malignant neoplasm of unspecified site of unspecified female breast: Secondary | ICD-10-CM | POA: Diagnosis not present

## 2020-07-04 DIAGNOSIS — E1142 Type 2 diabetes mellitus with diabetic polyneuropathy: Secondary | ICD-10-CM | POA: Diagnosis not present

## 2020-07-04 DIAGNOSIS — I130 Hypertensive heart and chronic kidney disease with heart failure and stage 1 through stage 4 chronic kidney disease, or unspecified chronic kidney disease: Secondary | ICD-10-CM | POA: Diagnosis not present

## 2020-07-04 DIAGNOSIS — Z4781 Encounter for orthopedic aftercare following surgical amputation: Secondary | ICD-10-CM | POA: Diagnosis not present

## 2020-07-04 DIAGNOSIS — N184 Chronic kidney disease, stage 4 (severe): Secondary | ICD-10-CM | POA: Diagnosis not present

## 2020-07-04 DIAGNOSIS — J45909 Unspecified asthma, uncomplicated: Secondary | ICD-10-CM | POA: Diagnosis not present

## 2020-07-04 DIAGNOSIS — E1122 Type 2 diabetes mellitus with diabetic chronic kidney disease: Secondary | ICD-10-CM | POA: Diagnosis not present

## 2020-07-04 DIAGNOSIS — I5042 Chronic combined systolic (congestive) and diastolic (congestive) heart failure: Secondary | ICD-10-CM | POA: Diagnosis not present

## 2020-07-04 DIAGNOSIS — I051 Rheumatic mitral insufficiency: Secondary | ICD-10-CM | POA: Diagnosis not present

## 2020-07-06 DIAGNOSIS — C50919 Malignant neoplasm of unspecified site of unspecified female breast: Secondary | ICD-10-CM | POA: Diagnosis not present

## 2020-07-06 DIAGNOSIS — I051 Rheumatic mitral insufficiency: Secondary | ICD-10-CM | POA: Diagnosis not present

## 2020-07-06 DIAGNOSIS — E1142 Type 2 diabetes mellitus with diabetic polyneuropathy: Secondary | ICD-10-CM | POA: Diagnosis not present

## 2020-07-06 DIAGNOSIS — J45909 Unspecified asthma, uncomplicated: Secondary | ICD-10-CM | POA: Diagnosis not present

## 2020-07-06 DIAGNOSIS — I5042 Chronic combined systolic (congestive) and diastolic (congestive) heart failure: Secondary | ICD-10-CM | POA: Diagnosis not present

## 2020-07-06 DIAGNOSIS — Z4781 Encounter for orthopedic aftercare following surgical amputation: Secondary | ICD-10-CM | POA: Diagnosis not present

## 2020-07-06 DIAGNOSIS — I509 Heart failure, unspecified: Secondary | ICD-10-CM | POA: Diagnosis not present

## 2020-07-06 DIAGNOSIS — I130 Hypertensive heart and chronic kidney disease with heart failure and stage 1 through stage 4 chronic kidney disease, or unspecified chronic kidney disease: Secondary | ICD-10-CM | POA: Diagnosis not present

## 2020-07-06 DIAGNOSIS — E1122 Type 2 diabetes mellitus with diabetic chronic kidney disease: Secondary | ICD-10-CM | POA: Diagnosis not present

## 2020-07-06 DIAGNOSIS — N184 Chronic kidney disease, stage 4 (severe): Secondary | ICD-10-CM | POA: Diagnosis not present

## 2020-07-06 NOTE — Progress Notes (Addendum)
LAASIA, ARCOS ([id number]) Visit Report for 07/03/2020 Arrival Information Details Patient Name: [name], [name] Date of Service: 07/03/2020 11:00 AM Medical Record Number: [id number] Patient Account Number: [id number] Date of Birth/Sex: 1956/06/10 (64 y.o. F) Treating RN: Cornell Barman Primary Care Aldora Perman: [name] Other Clinician: Referring Joss Friedel: [name] Treating Naziya Hegwood/Extender: Skipper Cliche in Treatment: 8 Visit Information History Since Last Visit Has Dressing in Place as Prescribed: Yes Patient Arrived: Wheel Chair Pain Present Now: No Arrival Time: 11:14 Accompanied By: husband Transfer Assistance: None Patient Identification Verified: Yes Secondary Verification Process Completed: Yes Patient Requires Transmission-Based Precautions: No Patient Has Alerts: No Electronic Signature(s) Signed: 07/06/2020 4:55:43 PM By: Gretta Cool, BSN, RN, CWS, Kim RN, BSN Entered By: Gretta Cool, BSN, RN, CWS, Kim on 07/03/2020 11:17:38 [name] ([id number]) -------------------------------------------------------------------------------- Encounter Discharge Information Details Patient Name: Gloria Both A. Date of Service: 07/03/2020 11:00 AM Medical Record Number: [id number] Patient Account Number: [id number] Date of Birth/Sex: 1955-08-31 (64 y.o. F) Treating RN: Cornell Barman Primary Care Weronika Birch: [name] Other Clinician: Referring Sakara Lehtinen: [name] Treating Kaylee Wombles/Extender: Skipper Cliche in Treatment: 8 Encounter Discharge Information Items Discharge Condition: Stable Ambulatory Status: Wheelchair Discharge Destination: Home Transportation: Private Auto Accompanied By: fiance Schedule Follow-up Appointment: Yes Clinical Summary of Care: Electronic Signature(s) Signed: 07/06/2020 4:55:43 PM By: Gretta Cool, BSN, RN, CWS, Kim RN, BSN Entered By: Gretta Cool, BSN, RN, CWS, Kim on 07/03/2020 11:58:41 [name]  ([id number]) -------------------------------------------------------------------------------- Lower Extremity Assessment Details Patient Name: Gloria Both A. Date of Service: 07/03/2020 11:00 AM Medical Record Number: [id number] Patient Account Number: [id number] Date of Birth/Sex: 03/27/1956 (64 y.o. F) Treating RN: Cornell Barman Primary Care Locklan Canoy: [name] Other Clinician: Referring Myleen Brailsford: [name] Treating Shyonna Carlin/Extender: Skipper Cliche in Treatment: 8 Vascular Assessment Pulses: Dorsalis Pedis Palpable: [Left:Yes] [Right:Yes] Electronic Signature(s) Signed: 07/06/2020 4:55:43 PM By: Gretta Cool, BSN, RN, CWS, Kim RN, BSN Entered By: Gretta Cool, BSN, RN, CWS, Kim on 07/03/2020 11:33:29 [name] ([id number]) -------------------------------------------------------------------------------- Multi Wound Chart Details Patient Name: Gloria Both A. Date of Service: 07/03/2020 11:00 AM Medical Record Number: [id number] Patient Account Number: [id number] Date of Birth/Sex: Sep 07, 1955 (64 y.o. F) Treating RN: Cornell Barman Primary Care Merida Alcantar: [name] Other Clinician: Referring Zakaiya Lares: [name] Treating Barton Want/Extender: Skipper Cliche in Treatment: 8 Vital Signs Height(in): 62 Pulse(bpm): 82 Weight(lbs): 196 Blood Pressure(mmHg): 166/89 Body Mass Index(BMI): 36 Temperature(F): 97.8 Respiratory Rate(breaths/min): 20 Photos: [N/A:N/A] Wound Location: Left, Lateral Foot Right, Plantar Calcaneus N/A Wounding Event: Surgical Injury Pressure Injury N/A Primary Etiology: Diabetic Wound/Ulcer of the Lower Diabetic Wound/Ulcer of the Lower N/A Extremity Extremity Secondary Etiology: N/A Pressure Ulcer N/A Comorbid History: Cataracts, Asthma, Congestive Cataracts, Asthma, Congestive N/A Heart Failure, Hypertension, Type II Heart Failure, Hypertension, Type II Diabetes, End Stage Renal Disease, Diabetes, End Stage Renal Disease, History of  pressure wounds, History of pressure wounds, Received Chemotherapy Received Chemotherapy Date Acquired: 04/06/2020 06/27/2020 N/A Weeks of Treatment: 8 0 N/A Wound Status: Open Open N/A Measurements L x W x D (cm) 6x0.4x1.7 0.7x0.9x0.1 N/A Area (cm) : 1.885 0.495 N/A Volume (cm) : 3.204 0.049 N/A % Reduction in Area: -336.30% N/A N/A % Reduction in Volume: -7351.20% N/A N/A Classification: Grade 2 Grade 1 N/A Exudate Amount: Large None Present N/A Exudate Type: Serous N/A N/A Exudate Color: amber N/A N/A Wound Margin: Distinct, outline attached Flat and Intact N/A Granulation Amount: Medium (34-66%) None Present (0%) N/A Granulation Quality: Pink N/A N/A Necrotic Amount: Medium (34-66%) Large (67-100%) N/A Necrotic Tissue: Adherent Slough Eschar N/A  Exposed Structures: Fat Layer (Subcutaneous Tissue): Fascia: No N/A Yes Fat Layer (Subcutaneous Tissue): Fascia: No No Tendon: No Tendon: No Muscle: No Muscle: No Joint: No Joint: No Bone: No Bone: No Epithelialization: None None N/A Treatment Notes Electronic Signature(s) Signed: 07/06/2020 4:55:43 PM By: Gretta Cool, BSN, RN, CWS, Kim RN, BSN [name] ([id number]) Entered By: Gretta Cool, BSN, RN, CWS, Kim on 07/03/2020 11:42:47 [name] ([id number]) -------------------------------------------------------------------------------- Multi-Disciplinary Care Plan Details Patient Name: Gloria Both A. Date of Service: 07/03/2020 11:00 AM Medical Record Number: [id number] Patient Account Number: [id number] Date of Birth/Sex: 1956/05/20 (64 y.o. F) Treating RN: Cornell Barman Primary Care Adyline Huberty: [name] Other Clinician: Referring Emma-Lee Oddo: [name] Treating Arcola Freshour/Extender: Skipper Cliche in Treatment: 8 Active Inactive Electronic Signature(s) Signed: 07/24/2020 12:13:55 PM By: Gretta Cool, BSN, RN, CWS, Kim RN, BSN Previous Signature: 07/06/2020 4:55:43 PM Version By: Gretta Cool, BSN, RN, CWS, Kim RN, BSN Entered  By: Gretta Cool, BSN, RN, CWS, Kim on 07/24/2020 12:13:55 [name] ([id number]) -------------------------------------------------------------------------------- Pain Assessment Details Patient Name: Gloria Both A. Date of Service: 07/03/2020 11:00 AM Medical Record Number: [id number] Patient Account Number: [id number] Date of Birth/Sex: 01-05-1956 (64 y.o. F) Treating RN: Cornell Barman Primary Care Trica Usery: [name] Other Clinician: Referring Kia Varnadore: [name] Treating Cruze Zingaro/Extender: Skipper Cliche in Treatment: 8 Active Problems Location of Pain Severity and Description of Pain Patient Has Paino No Site Locations Pain Management and Medication Current Pain Management: Notes Patient denies any wound pain at this time. Electronic Signature(s) Signed: 07/06/2020 4:55:43 PM By: Gretta Cool, BSN, RN, CWS, Kim RN, BSN Entered By: Gretta Cool, BSN, RN, CWS, Kim on 07/03/2020 11:20:51 [name] ([id number]) -------------------------------------------------------------------------------- Patient/Caregiver Education Details Patient Name: Gloria Both A. Date of Service: 07/03/2020 11:00 AM Medical Record Number: [id number] Patient Account Number: [id number] Date of Birth/Gender: October 25, 1955 (64 y.o. F) Treating RN: Cornell Barman Primary Care Physician: [name] Other Clinician: Referring Physician: [name] Treating Physician/Extender: Skipper Cliche in Treatment: 8 Education Assessment Education Provided To: Patient Education Topics Provided Wound Debridement: Handouts: Wound Debridement Methods: Demonstration, Explain/Verbal Responses: State content correctly Wound/Skin Impairment: Handouts: Caring for Your Ulcer Methods: Demonstration, Explain/Verbal Responses: State content correctly Electronic Signature(s) Signed: 07/06/2020 4:55:43 PM By: Gretta Cool, BSN, RN, CWS, Kim RN, BSN Entered By: Gretta Cool, BSN, RN, CWS, Kim on 07/03/2020 12:53:47 Hanneman,  [name] ([id number]) -------------------------------------------------------------------------------- Wound Assessment Details Patient Name: Gloria Both A. Date of Service: 07/03/2020 11:00 AM Medical Record Number: [id number] Patient Account Number: [id number] Date of Birth/Sex: 1955-08-28 (64 y.o. F) Treating RN: Cornell Barman Primary Care Shloimy Michalski: [name] Other Clinician: Referring Caleen Taaffe: [name] Treating Yafet Cline/Extender: Skipper Cliche in Treatment: 8 Wound Status Wound Number: 4 Primary Diabetic Wound/Ulcer of the Lower Extremity Etiology: Wound Location: Left, Lateral Foot Wound Open Wounding Event: Surgical Injury Status: Date Acquired: 04/06/2020 Comorbid Cataracts, Asthma, Congestive Heart Failure, Weeks Of Treatment: 8 History: Hypertension, Type II Diabetes, End Stage Renal Disease, Clustered Wound: No History of pressure wounds, Received Chemotherapy Photos Wound Measurements Length: (cm) 6 Width: (cm) 0.4 Depth: (cm) 1.7 Area: (cm) 1.885 Volume: (cm) 3.204 % Reduction in Area: -336.3% % Reduction in Volume: -7351.2% Epithelialization: None Tunneling: No Undermining: No Wound Description Classification: Grade 2 Wound Margin: Distinct, outline attached Exudate Amount: Large Exudate Type: Serous Exudate Color: amber Foul Odor After Cleansing: No Slough/Fibrino Yes Wound Bed Granulation Amount: Medium (34-66%) Exposed Structure Granulation Quality: Pink Fascia Exposed: No Necrotic Amount: Medium (34-66%) Fat Layer (Subcutaneous Tissue) Exposed: Yes Necrotic Quality: Adherent Slough Tendon Exposed: No  Muscle Exposed: No Joint Exposed: No Bone Exposed: No Electronic Signature(s) Signed: 07/06/2020 4:55:43 PM By: Gretta Cool, BSN, RN, CWS, Kim RN, BSN Entered By: Gretta Cool, BSN, RN, CWS, Kim on 07/03/2020 11:32:46 Popwell, [name] ([id number]) -------------------------------------------------------------------------------- Wound  Assessment Details Patient Name: Gloria Both A. Date of Service: 07/03/2020 11:00 AM Medical Record Number: [id number] Patient Account Number: [id number] Date of Birth/Sex: [birth year] (64 y.o. F) Treating RN: Cornell Barman Primary Care Jacobe Study: [name] Other Clinician: Referring Keyshawn Hellwig: [name] Treating Brei Pociask/Extender: Skipper Cliche in Treatment: 8 Wound Status Wound Number: 5 Primary Diabetic Wound/Ulcer of the Lower Extremity Etiology: Wound Location: Right, Plantar Calcaneus Secondary Pressure Ulcer Wounding Event: Pressure Injury Etiology: Date Acquired: 06/27/2020 Wound Open Weeks Of Treatment: 0 Status: Clustered Wound: No Comorbid Cataracts, Asthma, Congestive Heart Failure, History: Hypertension, Type II Diabetes, End Stage Renal Disease, History of pressure wounds, Received Chemotherapy Photos Wound Measurements Length: (cm) 0.7 Width: (cm) 0.9 Depth: (cm) 0.1 Area: (cm) 0.495 Volume: (cm) 0.049 % Reduction in Area: % Reduction in Volume: Epithelialization: None Wound Description Classification: Grade 1 Wound Margin: Flat and Intact Exudate Amount: None Present Foul Odor After Cleansing: No Slough/Fibrino No Wound Bed Granulation Amount: None Present (0%) Exposed Structure Necrotic Amount: Large (67-100%) Fascia Exposed: No Necrotic Quality: Eschar Fat Layer (Subcutaneous Tissue) Exposed: No Tendon Exposed: No Muscle Exposed: No Joint Exposed: No Bone Exposed: No Electronic Signature(s) Signed: 07/06/2020 4:55:43 PM By: Gretta Cool, BSN, RN, CWS, Kim RN, BSN Entered By: Gretta Cool, BSN, RN, CWS, Kim on 07/03/2020 11:31:44 Hagos, [name] ([id number]) -------------------------------------------------------------------------------- Vitals Details Patient Name: Gloria Both A. Date of Service: 07/03/2020 11:00 AM Medical Record Number: [id number] Patient Account Number: [id number] Date of Birth/Sex: 22-Aug-1955 (64 y.o. F) Treating RN:  Cornell Barman Primary Care Taeden Geller: [name] Other Clinician: Referring Nels Munn: [name] Treating Solenne Manwarren/Extender: Skipper Cliche in Treatment: 8 Vital Signs Time Taken: 11:17 Temperature (F): 97.8 Height (in): 62 Pulse (bpm): 84 Weight (lbs): 196 Respiratory Rate (breaths/min): 20 Body Mass Index (BMI): 35.8 Blood Pressure (mmHg): 166/89 Reference Range: 80 - 120 mg / dl Airway Pulse Oximetry (%): 90 Electronic Signature(s) Signed: 07/06/2020 4:55:43 PM By: Gretta Cool, BSN, RN, CWS, Kim RN, BSN Entered By: Gretta Cool, BSN, RN, CWS, Kim on 07/03/2020 11:18:30

## 2020-07-10 DIAGNOSIS — C50919 Malignant neoplasm of unspecified site of unspecified female breast: Secondary | ICD-10-CM | POA: Diagnosis not present

## 2020-07-10 DIAGNOSIS — E1122 Type 2 diabetes mellitus with diabetic chronic kidney disease: Secondary | ICD-10-CM | POA: Diagnosis not present

## 2020-07-10 DIAGNOSIS — I5042 Chronic combined systolic (congestive) and diastolic (congestive) heart failure: Secondary | ICD-10-CM | POA: Diagnosis not present

## 2020-07-10 DIAGNOSIS — J45909 Unspecified asthma, uncomplicated: Secondary | ICD-10-CM | POA: Diagnosis not present

## 2020-07-10 DIAGNOSIS — E1142 Type 2 diabetes mellitus with diabetic polyneuropathy: Secondary | ICD-10-CM | POA: Diagnosis not present

## 2020-07-10 DIAGNOSIS — N184 Chronic kidney disease, stage 4 (severe): Secondary | ICD-10-CM | POA: Diagnosis not present

## 2020-07-10 DIAGNOSIS — Z4781 Encounter for orthopedic aftercare following surgical amputation: Secondary | ICD-10-CM | POA: Diagnosis not present

## 2020-07-10 DIAGNOSIS — I051 Rheumatic mitral insufficiency: Secondary | ICD-10-CM | POA: Diagnosis not present

## 2020-07-10 DIAGNOSIS — I130 Hypertensive heart and chronic kidney disease with heart failure and stage 1 through stage 4 chronic kidney disease, or unspecified chronic kidney disease: Secondary | ICD-10-CM | POA: Diagnosis not present

## 2020-07-10 DIAGNOSIS — C50212 Malignant neoplasm of upper-inner quadrant of left female breast: Secondary | ICD-10-CM | POA: Diagnosis not present

## 2020-07-12 DIAGNOSIS — E1122 Type 2 diabetes mellitus with diabetic chronic kidney disease: Secondary | ICD-10-CM | POA: Diagnosis not present

## 2020-07-12 DIAGNOSIS — N184 Chronic kidney disease, stage 4 (severe): Secondary | ICD-10-CM | POA: Diagnosis not present

## 2020-07-12 DIAGNOSIS — C50919 Malignant neoplasm of unspecified site of unspecified female breast: Secondary | ICD-10-CM | POA: Diagnosis not present

## 2020-07-12 DIAGNOSIS — Z4781 Encounter for orthopedic aftercare following surgical amputation: Secondary | ICD-10-CM | POA: Diagnosis not present

## 2020-07-12 DIAGNOSIS — E1142 Type 2 diabetes mellitus with diabetic polyneuropathy: Secondary | ICD-10-CM | POA: Diagnosis not present

## 2020-07-12 DIAGNOSIS — I130 Hypertensive heart and chronic kidney disease with heart failure and stage 1 through stage 4 chronic kidney disease, or unspecified chronic kidney disease: Secondary | ICD-10-CM | POA: Diagnosis not present

## 2020-07-12 DIAGNOSIS — I5042 Chronic combined systolic (congestive) and diastolic (congestive) heart failure: Secondary | ICD-10-CM | POA: Diagnosis not present

## 2020-07-12 DIAGNOSIS — I051 Rheumatic mitral insufficiency: Secondary | ICD-10-CM | POA: Diagnosis not present

## 2020-07-12 DIAGNOSIS — J45909 Unspecified asthma, uncomplicated: Secondary | ICD-10-CM | POA: Diagnosis not present

## 2020-07-13 ENCOUNTER — Encounter: Payer: Self-pay | Admitting: Internal Medicine

## 2020-07-13 ENCOUNTER — Inpatient Hospital Stay
Admission: EM | Admit: 2020-07-13 | Discharge: 2020-07-20 | DRG: 597 | Disposition: A | Discharge status: Hospice | Payer: Medicare HMO | Attending: Internal Medicine | Admitting: Internal Medicine

## 2020-07-13 ENCOUNTER — Emergency Department: Payer: Medicare HMO

## 2020-07-13 ENCOUNTER — Inpatient Hospital Stay: Payer: Medicare HMO | Attending: Internal Medicine

## 2020-07-13 ENCOUNTER — Other Ambulatory Visit: Payer: Self-pay

## 2020-07-13 ENCOUNTER — Encounter: Payer: Self-pay | Admitting: Emergency Medicine

## 2020-07-13 ENCOUNTER — Inpatient Hospital Stay: Payer: Medicare HMO

## 2020-07-13 ENCOUNTER — Inpatient Hospital Stay (HOSPITAL_BASED_OUTPATIENT_CLINIC_OR_DEPARTMENT_OTHER): Payer: Medicare HMO | Admitting: Internal Medicine

## 2020-07-13 VITALS — BP 102/65 | HR 71 | Resp 18

## 2020-07-13 DIAGNOSIS — Z66 Do not resuscitate: Secondary | ICD-10-CM | POA: Diagnosis present

## 2020-07-13 DIAGNOSIS — I69354 Hemiplegia and hemiparesis following cerebral infarction affecting left non-dominant side: Secondary | ICD-10-CM

## 2020-07-13 DIAGNOSIS — J9611 Chronic respiratory failure with hypoxia: Secondary | ICD-10-CM | POA: Diagnosis present

## 2020-07-13 DIAGNOSIS — D6481 Anemia due to antineoplastic chemotherapy: Secondary | ICD-10-CM | POA: Diagnosis not present

## 2020-07-13 DIAGNOSIS — A419 Sepsis, unspecified organism: Secondary | ICD-10-CM | POA: Diagnosis present

## 2020-07-13 DIAGNOSIS — J910 Malignant pleural effusion: Secondary | ICD-10-CM | POA: Diagnosis not present

## 2020-07-13 DIAGNOSIS — Z515 Encounter for palliative care: Secondary | ICD-10-CM | POA: Diagnosis not present

## 2020-07-13 DIAGNOSIS — Z20822 Contact with and (suspected) exposure to covid-19: Secondary | ICD-10-CM | POA: Diagnosis present

## 2020-07-13 DIAGNOSIS — C7800 Secondary malignant neoplasm of unspecified lung: Secondary | ICD-10-CM | POA: Diagnosis present

## 2020-07-13 DIAGNOSIS — I5032 Chronic diastolic (congestive) heart failure: Secondary | ICD-10-CM | POA: Diagnosis present

## 2020-07-13 DIAGNOSIS — R918 Other nonspecific abnormal finding of lung field: Secondary | ICD-10-CM | POA: Diagnosis not present

## 2020-07-13 DIAGNOSIS — Z170 Estrogen receptor positive status [ER+]: Secondary | ICD-10-CM | POA: Diagnosis not present

## 2020-07-13 DIAGNOSIS — E1169 Type 2 diabetes mellitus with other specified complication: Secondary | ICD-10-CM | POA: Diagnosis present

## 2020-07-13 DIAGNOSIS — Z8249 Family history of ischemic heart disease and other diseases of the circulatory system: Secondary | ICD-10-CM

## 2020-07-13 DIAGNOSIS — Z801 Family history of malignant neoplasm of trachea, bronchus and lung: Secondary | ICD-10-CM

## 2020-07-13 DIAGNOSIS — C50212 Malignant neoplasm of upper-inner quadrant of left female breast: Secondary | ICD-10-CM

## 2020-07-13 DIAGNOSIS — I10 Essential (primary) hypertension: Secondary | ICD-10-CM | POA: Diagnosis present

## 2020-07-13 DIAGNOSIS — Z89422 Acquired absence of other left toe(s): Secondary | ICD-10-CM

## 2020-07-13 DIAGNOSIS — C782 Secondary malignant neoplasm of pleura: Secondary | ICD-10-CM | POA: Diagnosis present

## 2020-07-13 DIAGNOSIS — T502X5A Adverse effect of carbonic-anhydrase inhibitors, benzothiadiazides and other diuretics, initial encounter: Secondary | ICD-10-CM | POA: Diagnosis present

## 2020-07-13 DIAGNOSIS — Z9049 Acquired absence of other specified parts of digestive tract: Secondary | ICD-10-CM

## 2020-07-13 DIAGNOSIS — I959 Hypotension, unspecified: Secondary | ICD-10-CM

## 2020-07-13 DIAGNOSIS — Z8042 Family history of malignant neoplasm of prostate: Secondary | ICD-10-CM

## 2020-07-13 DIAGNOSIS — C7951 Secondary malignant neoplasm of bone: Secondary | ICD-10-CM | POA: Diagnosis present

## 2020-07-13 DIAGNOSIS — Z6834 Body mass index (BMI) 34.0-34.9, adult: Secondary | ICD-10-CM | POA: Diagnosis not present

## 2020-07-13 DIAGNOSIS — R531 Weakness: Secondary | ICD-10-CM

## 2020-07-13 DIAGNOSIS — I130 Hypertensive heart and chronic kidney disease with heart failure and stage 1 through stage 4 chronic kidney disease, or unspecified chronic kidney disease: Secondary | ICD-10-CM | POA: Diagnosis present

## 2020-07-13 DIAGNOSIS — D631 Anemia in chronic kidney disease: Secondary | ICD-10-CM | POA: Diagnosis not present

## 2020-07-13 DIAGNOSIS — Z8041 Family history of malignant neoplasm of ovary: Secondary | ICD-10-CM

## 2020-07-13 DIAGNOSIS — E785 Hyperlipidemia, unspecified: Secondary | ICD-10-CM | POA: Diagnosis present

## 2020-07-13 DIAGNOSIS — I340 Nonrheumatic mitral (valve) insufficiency: Secondary | ICD-10-CM | POA: Diagnosis present

## 2020-07-13 DIAGNOSIS — N2581 Secondary hyperparathyroidism of renal origin: Secondary | ICD-10-CM | POA: Diagnosis not present

## 2020-07-13 DIAGNOSIS — E871 Hypo-osmolality and hyponatremia: Secondary | ICD-10-CM | POA: Diagnosis present

## 2020-07-13 DIAGNOSIS — Z825 Family history of asthma and other chronic lower respiratory diseases: Secondary | ICD-10-CM

## 2020-07-13 DIAGNOSIS — T451X5A Adverse effect of antineoplastic and immunosuppressive drugs, initial encounter: Secondary | ICD-10-CM | POA: Diagnosis present

## 2020-07-13 DIAGNOSIS — D649 Anemia, unspecified: Secondary | ICD-10-CM

## 2020-07-13 DIAGNOSIS — R809 Proteinuria, unspecified: Secondary | ICD-10-CM | POA: Diagnosis present

## 2020-07-13 DIAGNOSIS — Z803 Family history of malignant neoplasm of breast: Secondary | ICD-10-CM

## 2020-07-13 DIAGNOSIS — J45909 Unspecified asthma, uncomplicated: Secondary | ICD-10-CM | POA: Diagnosis present

## 2020-07-13 DIAGNOSIS — N179 Acute kidney failure, unspecified: Secondary | ICD-10-CM | POA: Diagnosis present

## 2020-07-13 DIAGNOSIS — F32A Depression, unspecified: Secondary | ICD-10-CM | POA: Diagnosis present

## 2020-07-13 DIAGNOSIS — I129 Hypertensive chronic kidney disease with stage 1 through stage 4 chronic kidney disease, or unspecified chronic kidney disease: Secondary | ICD-10-CM | POA: Diagnosis not present

## 2020-07-13 DIAGNOSIS — Z808 Family history of malignant neoplasm of other organs or systems: Secondary | ICD-10-CM

## 2020-07-13 DIAGNOSIS — F419 Anxiety disorder, unspecified: Secondary | ICD-10-CM | POA: Diagnosis present

## 2020-07-13 DIAGNOSIS — Z90711 Acquired absence of uterus with remaining cervical stump: Secondary | ICD-10-CM

## 2020-07-13 DIAGNOSIS — J90 Pleural effusion, not elsewhere classified: Secondary | ICD-10-CM | POA: Diagnosis not present

## 2020-07-13 DIAGNOSIS — J3489 Other specified disorders of nose and nasal sinuses: Secondary | ICD-10-CM | POA: Diagnosis not present

## 2020-07-13 DIAGNOSIS — E860 Dehydration: Secondary | ICD-10-CM | POA: Diagnosis present

## 2020-07-13 DIAGNOSIS — Z888 Allergy status to other drugs, medicaments and biological substances status: Secondary | ICD-10-CM

## 2020-07-13 DIAGNOSIS — R4182 Altered mental status, unspecified: Secondary | ICD-10-CM | POA: Diagnosis not present

## 2020-07-13 DIAGNOSIS — Z91013 Allergy to seafood: Secondary | ICD-10-CM

## 2020-07-13 DIAGNOSIS — Z87891 Personal history of nicotine dependence: Secondary | ICD-10-CM

## 2020-07-13 DIAGNOSIS — N170 Acute kidney failure with tubular necrosis: Secondary | ICD-10-CM | POA: Diagnosis not present

## 2020-07-13 DIAGNOSIS — Z833 Family history of diabetes mellitus: Secondary | ICD-10-CM

## 2020-07-13 DIAGNOSIS — Z794 Long term (current) use of insulin: Secondary | ICD-10-CM

## 2020-07-13 DIAGNOSIS — R7989 Other specified abnormal findings of blood chemistry: Secondary | ICD-10-CM | POA: Diagnosis present

## 2020-07-13 DIAGNOSIS — Z882 Allergy status to sulfonamides status: Secondary | ICD-10-CM

## 2020-07-13 DIAGNOSIS — E669 Obesity, unspecified: Secondary | ICD-10-CM | POA: Diagnosis present

## 2020-07-13 DIAGNOSIS — K219 Gastro-esophageal reflux disease without esophagitis: Secondary | ICD-10-CM | POA: Diagnosis present

## 2020-07-13 DIAGNOSIS — N281 Cyst of kidney, acquired: Secondary | ICD-10-CM | POA: Diagnosis not present

## 2020-07-13 DIAGNOSIS — N184 Chronic kidney disease, stage 4 (severe): Secondary | ICD-10-CM | POA: Diagnosis present

## 2020-07-13 DIAGNOSIS — L8990 Pressure ulcer of unspecified site, unspecified stage: Secondary | ICD-10-CM | POA: Insufficient documentation

## 2020-07-13 DIAGNOSIS — D638 Anemia in other chronic diseases classified elsewhere: Secondary | ICD-10-CM | POA: Diagnosis present

## 2020-07-13 DIAGNOSIS — R059 Cough, unspecified: Secondary | ICD-10-CM

## 2020-07-13 DIAGNOSIS — E11649 Type 2 diabetes mellitus with hypoglycemia without coma: Secondary | ICD-10-CM | POA: Diagnosis not present

## 2020-07-13 DIAGNOSIS — I952 Hypotension due to drugs: Secondary | ICD-10-CM | POA: Diagnosis present

## 2020-07-13 DIAGNOSIS — Z806 Family history of leukemia: Secondary | ICD-10-CM

## 2020-07-13 DIAGNOSIS — Z79899 Other long term (current) drug therapy: Secondary | ICD-10-CM

## 2020-07-13 DIAGNOSIS — E875 Hyperkalemia: Secondary | ICD-10-CM | POA: Diagnosis not present

## 2020-07-13 DIAGNOSIS — C50919 Malignant neoplasm of unspecified site of unspecified female breast: Secondary | ICD-10-CM | POA: Diagnosis not present

## 2020-07-13 DIAGNOSIS — Z7982 Long term (current) use of aspirin: Secondary | ICD-10-CM

## 2020-07-13 DIAGNOSIS — Z800 Family history of malignant neoplasm of digestive organs: Secondary | ICD-10-CM

## 2020-07-13 DIAGNOSIS — Z89432 Acquired absence of left foot: Secondary | ICD-10-CM

## 2020-07-13 DIAGNOSIS — E1122 Type 2 diabetes mellitus with diabetic chronic kidney disease: Secondary | ICD-10-CM | POA: Diagnosis present

## 2020-07-13 DIAGNOSIS — D72829 Elevated white blood cell count, unspecified: Secondary | ICD-10-CM | POA: Diagnosis present

## 2020-07-13 DIAGNOSIS — L89616 Pressure-induced deep tissue damage of right heel: Secondary | ICD-10-CM | POA: Diagnosis present

## 2020-07-13 DIAGNOSIS — C801 Malignant (primary) neoplasm, unspecified: Secondary | ICD-10-CM

## 2020-07-13 LAB — CBC WITH DIFFERENTIAL/PLATELET
Abs Immature Granulocytes: 0.41 10*3/uL — ABNORMAL HIGH (ref 0.00–0.07)
Basophils Absolute: 0.1 10*3/uL (ref 0.0–0.1)
Basophils Relative: 0 %
Eosinophils Absolute: 0 10*3/uL (ref 0.0–0.5)
Eosinophils Relative: 0 %
HCT: 25 % — ABNORMAL LOW (ref 36.0–46.0)
Hemoglobin: 8.6 g/dL — ABNORMAL LOW (ref 12.0–15.0)
Immature Granulocytes: 2 %
Lymphocytes Relative: 2 %
Lymphs Abs: 0.5 10*3/uL — ABNORMAL LOW (ref 0.7–4.0)
MCH: 30.4 pg (ref 26.0–34.0)
MCHC: 34.4 g/dL (ref 30.0–36.0)
MCV: 88.3 fL (ref 80.0–100.0)
Monocytes Absolute: 1.6 10*3/uL — ABNORMAL HIGH (ref 0.1–1.0)
Monocytes Relative: 7 %
Neutro Abs: 19.8 10*3/uL — ABNORMAL HIGH (ref 1.7–7.7)
Neutrophils Relative %: 89 %
Platelets: 358 10*3/uL (ref 150–400)
RBC: 2.83 MIL/uL — ABNORMAL LOW (ref 3.87–5.11)
RDW: 15.6 % — ABNORMAL HIGH (ref 11.5–15.5)
WBC: 22.3 10*3/uL — ABNORMAL HIGH (ref 4.0–10.5)
nRBC: 0 % (ref 0.0–0.2)

## 2020-07-13 LAB — SAMPLE TO BLOOD BANK

## 2020-07-13 LAB — COMPREHENSIVE METABOLIC PANEL
ALT: 12 U/L (ref 0–44)
AST: 14 U/L — ABNORMAL LOW (ref 15–41)
Albumin: 2.2 g/dL — ABNORMAL LOW (ref 3.5–5.0)
Alkaline Phosphatase: 90 U/L (ref 38–126)
Anion gap: 14 (ref 5–15)
BUN: 107 mg/dL — ABNORMAL HIGH (ref 8–23)
CO2: 26 mmol/L (ref 22–32)
Calcium: 9 mg/dL (ref 8.9–10.3)
Chloride: 88 mmol/L — ABNORMAL LOW (ref 98–111)
Creatinine, Ser: 6.58 mg/dL — ABNORMAL HIGH (ref 0.44–1.00)
GFR, Estimated: 7 mL/min — ABNORMAL LOW (ref >60)
Glucose, Bld: 187 mg/dL — ABNORMAL HIGH (ref 70–99)
Potassium: 5.1 mmol/L (ref 3.5–5.1)
Sodium: 128 mmol/L — ABNORMAL LOW (ref 135–145)
Total Bilirubin: 0.8 mg/dL (ref 0.3–1.2)
Total Protein: 7.2 g/dL (ref 6.5–8.1)

## 2020-07-13 LAB — CBG MONITORING, ED
Glucose-Capillary: 137 mg/dL — ABNORMAL HIGH (ref 70–99)
Glucose-Capillary: 152 mg/dL — ABNORMAL HIGH (ref 70–99)
Glucose-Capillary: 156 mg/dL — ABNORMAL HIGH (ref 70–99)
Glucose-Capillary: 36 mg/dL — CL (ref 70–99)
Glucose-Capillary: 43 mg/dL — CL (ref 70–99)
Glucose-Capillary: 75 mg/dL (ref 70–99)

## 2020-07-13 LAB — RESP PANEL BY RT-PCR (FLU A&B, COVID) ARPGX2
Influenza A by PCR: NEGATIVE
Influenza B by PCR: NEGATIVE
SARS Coronavirus 2 by RT PCR: NEGATIVE

## 2020-07-13 LAB — LACTIC ACID, PLASMA: Lactic Acid, Venous: 1.5 mmol/L (ref 0.5–1.9)

## 2020-07-13 MED ORDER — HEPARIN SOD (PORK) LOCK FLUSH 100 UNIT/ML IV SOLN
500.0000 [IU] | Freq: Once | INTRAVENOUS | Status: DC
  Filled 2020-07-13: qty 5

## 2020-07-13 MED ORDER — SODIUM CHLORIDE 0.9 % IV SOLN
Freq: Once | INTRAVENOUS | Status: AC
  Filled 2020-07-13: qty 250

## 2020-07-13 MED ORDER — ONDANSETRON HCL 4 MG PO TABS: 4.0000 mg | ORAL_TABLET | Freq: Four times a day (QID) | ORAL | Status: DC | PRN

## 2020-07-13 MED ORDER — VANCOMYCIN HCL 1750 MG/350ML IV SOLN
1750.0000 mg | Freq: Once | INTRAVENOUS | Status: AC
  Administered 2020-07-13: 1750 mg via INTRAVENOUS
  Filled 2020-07-13: qty 350

## 2020-07-13 MED ORDER — HEPARIN SODIUM (PORCINE) 5000 UNIT/ML IJ SOLN
5000.0000 [IU] | Freq: Three times a day (TID) | INTRAMUSCULAR | Status: DC
  Administered 2020-07-13 – 2020-07-20 (×19): 5000 [IU] via SUBCUTANEOUS
  Filled 2020-07-13 (×20): qty 1

## 2020-07-13 MED ORDER — FAMOTIDINE 20 MG PO TABS: 20.0000 mg | ORAL_TABLET | Freq: Two times a day (BID) | ORAL | Status: DC

## 2020-07-13 MED ORDER — ACETAMINOPHEN-CODEINE #4 300-60 MG PO TABS: 1.0000 | ORAL_TABLET | Freq: Two times a day (BID) | ORAL | Status: DC

## 2020-07-13 MED ORDER — DEXTROSE-NACL 5-0.9 % IV SOLN: INTRAVENOUS | Status: DC

## 2020-07-13 MED ORDER — SODIUM CHLORIDE 0.9 % IV SOLN
2.0000 g | Freq: Once | INTRAVENOUS | Status: AC
  Administered 2020-07-13: 2 g via INTRAVENOUS
  Filled 2020-07-13: qty 2

## 2020-07-13 MED ORDER — DOCUSATE SODIUM 100 MG PO CAPS
100.0000 mg | ORAL_CAPSULE | Freq: Every day | ORAL | Status: DC
  Administered 2020-07-14 – 2020-07-17 (×4): 100 mg via ORAL
  Filled 2020-07-13 (×6): qty 1

## 2020-07-13 MED ORDER — SODIUM CHLORIDE 0.9 % IV SOLN: INTRAVENOUS | Status: DC

## 2020-07-13 MED ORDER — FAMOTIDINE 20 MG PO TABS
20.0000 mg | ORAL_TABLET | Freq: Every day | ORAL | Status: DC
  Administered 2020-07-14 – 2020-07-19 (×6): 20 mg via ORAL
  Filled 2020-07-13 (×7): qty 1

## 2020-07-13 MED ORDER — ASPIRIN EC 81 MG PO TBEC
81.0000 mg | DELAYED_RELEASE_TABLET | Freq: Every day | ORAL | Status: DC
  Administered 2020-07-14 – 2020-07-19 (×6): 81 mg via ORAL
  Filled 2020-07-13 (×7): qty 1

## 2020-07-13 MED ORDER — FLUOXETINE HCL 20 MG PO CAPS
20.0000 mg | ORAL_CAPSULE | Freq: Two times a day (BID) | ORAL | Status: DC
  Administered 2020-07-13 – 2020-07-19 (×12): 20 mg via ORAL
  Filled 2020-07-13 (×15): qty 1

## 2020-07-13 MED ORDER — INSULIN ASPART 100 UNIT/ML ~~LOC~~ SOLN: 0.0000 [IU] | Freq: Three times a day (TID) | SUBCUTANEOUS | Status: DC

## 2020-07-13 MED ORDER — ALPRAZOLAM 0.5 MG PO TABS
0.5000 mg | ORAL_TABLET | Freq: Two times a day (BID) | ORAL | Status: DC | PRN
  Administered 2020-07-17: 05:00:00 0.5 mg via ORAL
  Filled 2020-07-13 (×2): qty 1

## 2020-07-13 MED ORDER — ALBUTEROL SULFATE HFA 108 (90 BASE) MCG/ACT IN AERS
2.0000 | INHALATION_SPRAY | Freq: Four times a day (QID) | RESPIRATORY_TRACT | Status: DC | PRN
  Filled 2020-07-13: qty 6.7

## 2020-07-13 MED ORDER — GABAPENTIN 100 MG PO CAPS
100.0000 mg | ORAL_CAPSULE | Freq: Every day | ORAL | Status: DC
  Administered 2020-07-13 – 2020-07-19 (×7): 100 mg via ORAL
  Filled 2020-07-13 (×7): qty 1

## 2020-07-13 MED ORDER — SIMVASTATIN 20 MG PO TABS
20.0000 mg | ORAL_TABLET | Freq: Every evening | ORAL | Status: DC
  Administered 2020-07-14 – 2020-07-19 (×6): 20 mg via ORAL
  Filled 2020-07-13: qty 2
  Filled 2020-07-13 (×6): qty 1

## 2020-07-13 MED ORDER — SODIUM CHLORIDE 0.9% FLUSH
10.0000 mL | Freq: Once | INTRAVENOUS | Status: AC
  Administered 2020-07-13: 10 mL via INTRAVENOUS
  Filled 2020-07-13: qty 10

## 2020-07-13 MED ORDER — FLUTICASONE PROPIONATE (INHAL) 100 MCG/BLIST IN AEPB: 2.0000 | INHALATION_SPRAY | RESPIRATORY_TRACT | Status: DC

## 2020-07-13 MED ORDER — SENNA 8.6 MG PO TABS: 1.0000 | ORAL_TABLET | Freq: Two times a day (BID) | ORAL | Status: DC | PRN

## 2020-07-13 MED ORDER — ONDANSETRON HCL 4 MG/2ML IJ SOLN: 4.0000 mg | Freq: Four times a day (QID) | INTRAMUSCULAR | Status: DC | PRN

## 2020-07-13 MED ORDER — FERROUS SULFATE 325 (65 FE) MG PO TABS
325.0000 mg | ORAL_TABLET | Freq: Every day | ORAL | Status: DC
  Administered 2020-07-14 – 2020-07-19 (×6): 325 mg via ORAL
  Filled 2020-07-13 (×7): qty 1

## 2020-07-13 MED ORDER — LACTATED RINGERS IV BOLUS
30.0000 mL/kg | Freq: Once | INTRAVENOUS | Status: AC
  Administered 2020-07-13: 1503 mL via INTRAVENOUS

## 2020-07-13 MED ORDER — CALCITRIOL 0.25 MCG PO CAPS
0.2500 ug | ORAL_CAPSULE | Freq: Every day | ORAL | Status: DC
  Administered 2020-07-14: 12:00:00 0.25 ug via ORAL
  Filled 2020-07-13: qty 1

## 2020-07-13 NOTE — Assessment & Plan Note (Addendum)
#  stage IV breast cancer ER/PR positive HER-2 negative -October 30 CT scan-progression of disease-pleural metastases/bilateral effusion; worsening lytic lesions in the bones.  Currently on weekly DOXIL.  # HOLD  #4 weekly DOXIL today. Labs today reviewed;  UNacceptable for treatment today- see below.   #Leukocytosis white count 22 [no steroids/or growth factors]-hypotension 80 systolic/acute renal failure-?  High suspicion for underlying infection.  Recommend IV fluids 1 L.  Discussed with hospitalist service; no beds available for direct admit.  Recommend evaluation in the emergency room.  # Bilateral pleural effusion status post thoracentesis [OCT 2021]-cytology negative; on O2 nasal cannula.  Will need further work-up.  # Anemia- sec to chemo-CKD- IV- hemoglobin-8.6; STABLE.    # acute Renal failyure on Chronic kidney disease/CKD-IV [baseline creatinine 2.5; today creatinine 6.5/potassium 5.1; sodium 128] Underlying infection/diuresis versus others.  Evaluation emergency room/nephrology evaluation.  # Bone mets-sclerotic; Right acetabular uptake-s/p radiation; CT scan shows worsening metastases in the vertebrae; currently asymptomatic-STABLE  #Unfortunately patient's prognosis is grim/given multiple comorbidities poor tolerance to therapy.  Previously evaluated by palliative care; continues to be on full code.  We will revisit-CODE STATUS/goals of care again at this admission.   # DISPOSITION:  # HOLD chemo today; # IVFs- 1 lit over 1 hour.  # keep appt for as planned-Dec 17th- MD; labs- cbc/cmp;ca-27-29;-DOXIL; HOLD tube;- Dr.B   

## 2020-07-13 NOTE — ED Provider Notes (Signed)
[facility] Emergency Department Provider Note  ____________________________________________   Event Date/Time   First MD Initiated Contact with Patient 07/13/20 1123     (approximate)  I have reviewed the triage vital signs and the nursing notes.   HISTORY  Chief Complaint abnormal labs   HPI [name] is a 64 y.o. female with a past medical history of breast cancer currently on outpatient chemotherapy, CKD 4, chronic hypoxic respite failure on 3 L secondary to recurrent pleural effusions, anemia, asthma, CHF, CVA and mitral regurg who presents for further evaluation assessment of referred to the ED from cancer center after prechemo infusion labs were remarkable for evidence of acute kidney failure.  Patient states that she has had cough and congestion over the last week as well as achiness in all her joints.  She denies any chest pain, dental pain, nausea, vomiting, diarrhea, dysuria, blood in stool, blood in her urine, recent falls or injuries or rash.  She does note she has a wound on her right heel and recent amputation of the left fifth toe but denies any new pain drainage or bleeding from these wounds.  Denies any other acute physical symptoms at this time         Past Medical History:  Diagnosis Date  . Anemia   . Anxiety   . Asthma   . Cancer ([place]) 03/10/2018   Per NM PET order. Carcinoma of upper-inner quadrant of left breast in female, estrogen receptor positive .  Marland Kitchen Cancer (HCC)    LUNG  . CHF (congestive heart failure) ([place]) 1997  . CKD (chronic kidney disease)   . Depression   . Diabetes mellitus, type 2 ([place])   . Family history of breast cancer   . Family history of colon cancer   . Family history of ovarian cancer   . Family history of pancreatic cancer   . Family history of prostate cancer   . Family history of stomach cancer   . GERD (gastroesophageal reflux disease)    history of an ulcer  . Hair loss   . History of left  breast cancer 05/29/14  . History of partial hysterectomy 12/31/2016   Per patient.  Has not had a period in years.  Had a partial hysterectomy years ago.  Marland Kitchen Hypertension   . Mitral valve regurgitation   . Neuromuscular disorder (HCC)    neuropathies in hand  . Obesity   . Pancreatitis 1997  . Stroke [facility]) 2010   with mild left arm weakness    Patient Active Problem List   Diagnosis Date Noted  . Hyponatremia 07/13/2020  . Palliative care encounter   . Acute on chronic diastolic CHF (congestive heart failure) ([place]) 06/02/2020  . 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 Leukocytosis 06/02/2020  . Osteomyelitis ([place]) 04/05/2020  . Diabetic foot ulcer ([place]) 04/05/2020  . CKD (chronic kidney disease), stage III ([place]) 04/05/2020  . Sepsis ([place]) 03/29/2020  . Antineoplastic chemotherapy induced anemia 03/02/2020  . Weakness generalized 03/02/2020  . Fall at home 03/02/2020  . Tear of meniscus of knee 11/17/2019  . Lactic acidosis   . AKI (acute kidney injury) ([place]) 09/27/2019  . Metastatic breast cancer ([place]) 09/27/2019  . Hypertension complicating diabetes ([place]) 09/27/2019  . Hyperlipidemia associated with type 2 diabetes mellitus ([place]) 09/27/2019  . Hyperkalemia 09/27/2019  . Taking a statin medication 05/18/2019  . Anemia of chronic disease 04/27/2019  . Benign hypertensive kidney disease with chronic kidney disease 04/27/2019  .  Hyposmolality and/or hyponatremia 04/27/2019  . Proteinuria 04/27/2019  . Secondary hyperparathyroidism of renal origin ([place]) 04/27/2019  . Goals of care, counseling/discussion 10/01/2018  . Major depressive disorder, recurrent, in remission ([place]) 09/13/2018  . Metastasis from malignant tumor of breast ([place]) 09/13/2018  . Genetic testing 07/02/2018  . Family history of breast cancer   . Family history of ovarian cancer   . Family history of pancreatic cancer   . Family history of colon cancer   . Family history of stomach cancer   . Family  history of prostate cancer   . Carcinoma of upper-inner quadrant of left breast in female, estrogen receptor positive ([place]) 05/23/2016  . Type 2 diabetes mellitus with diabetic chronic kidney disease ([place]) 08/27/2015  . Cerebrovascular accident (CVA) ([place]) 08/27/2015  . Benign essential HTN 12/15/2014  . Chronic systolic CHF (congestive heart failure) ([place]) 06/21/2014  . Combined fat and carbohydrate induced hyperlipemia 06/21/2014  . MI (mitral incompetence) 06/21/2014  . Asthma 06/09/2014  . Chronic kidney disease (CKD), stage V ([place]) 06/09/2014  . Anxiety and depression 06/09/2014    Past Surgical History:  Procedure Laterality Date  . AMPUTATION Left 03/30/2020   Procedure: AMPUTATION 5th RAY;  Surgeon: Samara Deist, DPM;  Location: [facility] ORS;  Service: Podiatry;  Laterality: Left;  . APPLICATION OF WOUND VAC Left 03/30/2020   Procedure: APPLICATION OF WOUND VAC;  Surgeon: Samara Deist, DPM;  Location: [facility] ORS;  Service: Podiatry;  Laterality: Left;  . CATARACT EXTRACTION W/PHACO Right 02/24/2019   Procedure: CATARACT EXTRACTION PHACO AND INTRAOCULAR LENS PLACEMENT ([place]) RIGHT DIABETES;  Surgeon: Marchia Meiers, MD;  Location: [facility] ORS;  Service: Ophthalmology;  Laterality: Right;  [place] 01:13.0 CDE 7.96 Fluid Pack Lot # [id number]  . CATARACT EXTRACTION W/PHACO Left 03/24/2019   Procedure: CATARACT EXTRACTION PHACO AND INTRAOCULAR LENS PLACEMENT (I[facility]) - left diabetic;  Surgeon: Marchia Meiers, MD;  Location: [facility] ORS;  Service: Ophthalmology;  Laterality: Left;  [place]  01:36 CDE 13.93 Fluid pack lot # [id number]  . CENTRAL LINE INSERTION-TUNNELED N/A 04/04/2020   Procedure: CENTRAL LINE INSERTION-TUNNELED;  Surgeon: Katha Cabal, MD;  Location: [facility];  Service: Cardiovascular;  Laterality: N/A;  . CESAREAN SECTION    . CHOLECYSTECTOMY    . DIALYSIS/PERMA CATHETER REMOVAL N/A 05/01/2020   Procedure: DIALYSIS/PERMA CATHETER REMOVAL;  Surgeon: Katha Cabal, MD;   Location: [facility];  Service: Cardiovascular;  Laterality: N/A;  . EXCISION OF TONGUE LESION N/A 08/17/2018   Procedure: EXCISION OF TONGUE LESION WITH FROZEN SECTIONS;  Surgeon: Beverly Gust, MD;  Location: [facility] OR[facility];  Service: ENT;  Laterality: N/A;  . EYE SURGERY Right    cataract extraction  . IRRIGATION AND DEBRIDEMENT FOOT Left 03/30/2020   Procedure: IRRIGATION AND DEBRIDEMENT FOOT;  Surgeon: Samara Deist, DPM;  Location: [facility] ORS;  Service: Podiatry;  Laterality: Left;  . PARTIAL HYSTERECTOMY  12/31/2016   Per patient, she has not had a period in years since she had a partial hysterectomy.  Marland Kitchen PORTA CATH INSERTION    . TUBAL LIGATION      Prior to Admission medications   Medication Sig Start Date End Date Taking? Authorizing Provider  acetaminophen-codeine (TYLENOL #4) 300-60 MG tablet Take 1 tablet by mouth in the morning and at bedtime.    [provider]  albuterol (VENTOLIN HFA) 108 (90 Base) MCG/ACT inhaler Inhale 2 puffs into the lungs every 6 (six) hours as needed for wheezing or shortness of breath.  06/20/14  [provider]  ALPRAZolam Duanne Moron) 0.5 MG tablet Take 0.5 mg by mouth 2 (two) times daily as needed for anxiety or sleep.     [provider]  amLODipine (NORVASC) 5 MG tablet  06/14/20   [provider]  aspirin EC 81 MG tablet Take 81 mg by mouth daily.  06/21/14   [provider]  atenolol (TENORMIN) 50 MG tablet Take 50 mg by mouth 2 (two) times daily.  04/07/14   [provider]  B-D ULTRAFINE III SHORT PEN 31G X 8 MM MISC Inject into the skin 4 (four) times daily. 01/22/20   [provider]  bumetanide (BUMEX) 1 MG tablet Take 1 tablet (1 mg total) by mouth 2 (two) times daily. 06/06/20 07/06/20  Sidney Ace, MD  calcitRIOL (ROCALTROL) 0.25 MCG capsule Take 0.25 mcg by mouth daily.  09/21/16   [provider]  Cinnamon 500 MG capsule Take 500 mg by mouth 2 (two) times daily.      [provider]  cloNIDine (CATAPRES) 0.2 MG tablet Take 0.2 mg by mouth 2 (two) times daily.  06/21/14   [provider]  docusate sodium (COLACE) 100 MG capsule Take 1 capsule (100 mg total) by mouth 2 (two) times daily. Patient taking differently: Take 100 mg by mouth daily. 04/06/20   Enzo Bi, MD  enalapril (VASOTEC) 10 MG tablet Take 20 mg by mouth 2 (two) times a day.  01/12/19   [provider]  famotidine (PEPCID) 20 MG tablet Take 20 mg by mouth 2 (two) times daily.  09/14/19   [provider]  ferrous sulfate 325 (65 FE) MG tablet Take 325 mg by mouth daily with breakfast.    [provider]  FLUoxetine (PROZAC) 20 MG capsule Take 20 mg by mouth 2 (two) times daily.  06/21/14   [provider]  Fluticasone Propionate, Inhal, (FLOVENT DISKUS) 100 MCG/BLIST AEPB Inhale 2 puffs into the lungs in the morning and at bedtime.    [provider]  gabapentin (NEURONTIN) 100 MG capsule TAKE 1 CAPSULE(100 MG) BY MOUTH AT BEDTIME Patient taking differently: Take 100 mg by mouth at bedtime. 11/04/19   Cammie Sickle, MD  glyBURIDE (DIABETA) 5 MG tablet Take 10 mg by mouth 2 (two) times daily with a meal.  06/20/14   [provider]  LEVEMIR FLEXTOUCH 100 UNIT/ML FlexPen Inject 25 Units into the skin daily. 04/06/20   Enzo Bi, MD  NOVOLOG FLEXPEN 100 UNIT/ML FlexPen Inject 5 Units into the skin 3 (three) times daily with meals. Patient taking differently: Inject 7 Units into the skin 2 (two) times daily with a meal. 04/06/20   Enzo Bi, MD  senna (SENOKOT) 8.6 MG TABS tablet Take 1 tablet (8.6 mg total) by mouth 2 (two) times daily as needed for mild constipation or moderate constipation. 04/06/20   Enzo Bi, MD  simvastatin (ZOCOR) 20 MG tablet Take 20 mg by mouth every evening.     [provider]    Allergies Fish-derived products, Sulfamethoxazole-trimethoprim, and Chlorhexidine  Family History  Problem  Relation Age of Onset  . Ovarian cancer Mother 75  . Diabetes Mother   . Hypertension Mother   . COPD Father   . Hypertension Father   . Colon cancer Father 6  . Diabetes Sister   . Breast cancer Sister 26       bilateral  . Diabetes Brother   . Leukemia Maternal Aunt   . Pancreatic cancer  Paternal Aunt 33  . Pancreatic cancer Paternal Uncle   . Colon cancer Paternal Uncle   . Stomach cancer Maternal Grandfather 8  . Throat cancer Paternal Grandmother   . Breast cancer Maternal Aunt 80  . Colon cancer Maternal Aunt   . Bone cancer Maternal Aunt   . Breast cancer Paternal Aunt        dx >50  . Prostate cancer Paternal Uncle   . Pancreatic cancer Paternal Uncle   . Throat cancer Paternal Uncle   . Lung cancer Paternal Uncle   . Stomach cancer Paternal Uncle   . Brain cancer Paternal Aunt   . Cancer Cousin        liver, kidney  . Prostate cancer Cousin        meastatic  . Lung cancer Other     Social History Social History   Tobacco Use  . Smoking status: Former Smoker    Packs/day: 0.50    Years: 1.00    Pack years: 0.50    Types: Cigarettes  . Smokeless tobacco: Never Used  Vaping Use  . Vaping Use: Never used  Substance Use Topics  . Alcohol use: No    Alcohol/week: 0.0 standard drinks  . Drug use: No    Review of Systems  Review of Systems  Constitutional: Positive for malaise/fatigue. Negative for chills and fever.  HENT: Positive for congestion. Negative for sore throat.   Eyes: Negative for pain.  Respiratory: Positive for cough. Negative for stridor.   Cardiovascular: Negative for chest pain.  Gastrointestinal: Negative for vomiting.  Genitourinary: Negative for dysuria.  Musculoskeletal: Positive for myalgias.  Skin: Negative for rash.  Neurological: Negative for seizures, loss of consciousness and headaches.  Psychiatric/Behavioral: Negative for suicidal ideas.  All other systems reviewed and are negative.      ____________________________________________   PHYSICAL EXAM:  VITAL SIGNS: ED Triage Vitals  Enc Vitals Group     BP      Pulse      Resp      Temp      Temp src      SpO2      Weight      Height      Head Circumference      Peak Flow      Pain Score      Pain Loc      Pain Edu?      Excl. in [place]?    Vitals:   07/13/20 1315 07/13/20 1330  BP: (!) 98/57 (!) 93/55  Pulse: 69 68  Resp: (!) 28 (!) 23  Temp:    SpO2: 100% 100%   Physical Exam Vitals and nursing note reviewed.  Constitutional:      General: She is not in acute distress.    Appearance: She is well-developed and well-nourished. She is ill-appearing.  HENT:     Head: Normocephalic and atraumatic.     Right Ear: External ear normal.     Left Ear: External ear normal.     Nose: Nose normal.     Mouth/Throat:     Mouth: Mucous membranes are dry.  Eyes:     Conjunctiva/sclera: Conjunctivae normal.  Cardiovascular:     Rate and Rhythm: Normal rate and regular rhythm.     Heart sounds: No murmur heard.   Pulmonary:     Effort: Pulmonary effort is normal. No respiratory distress.     Breath sounds: Decreased air movement present. Examination of the right-lower field  reveals decreased breath sounds. Examination of the left-lower field reveals decreased breath sounds. Decreased breath sounds present.  Abdominal:     Palpations: Abdomen is soft.     Tenderness: There is no abdominal tenderness.  Musculoskeletal:     Cervical back: Neck supple.     Right lower leg: Edema present.     Left lower leg: Edema present.  Skin:    General: Skin is warm and dry.     Capillary Refill: Capillary refill takes more than 3 seconds.  Neurological:     Mental Status: She is alert and oriented to person, place, and time.  Psychiatric:        Mood and Affect: Mood and affect and mood normal.     There is a pressure wound on the base of the right heel.  There is a wound at the site of the left fifth toe  amputation with packing in place that was removed revealing some granulation tissue underneath.  There is some scant purulent drainage. ____________________________________________   LABS (all labs ordered are listed, but only abnormal results are displayed)  Labs Reviewed  RESP PANEL BY RT-PCR (FLU A&B, COVID) ARPGX2  URINE CULTURE  CULTURE, BLOOD (ROUTINE X 2)  CULTURE, BLOOD (ROUTINE X 2)  LACTIC ACID, PLASMA  URINE DRUG SCREEN, QUALITATIVE ([facility] ONLY)  URINALYSIS, COMPLETE (UACMP) WITH MICROSCOPIC  PROTIME-INR  APTT  HEMOGLOBIN A1C   ____________________________________________  EKG  Sinus rhythm with a ventricular rate of 70, normal axis, unremarkable intervals, no clear evidence of acute ischemia. ____________________________________________  RADIOLOGY  ED MD interpretation: Renal ultrasound shows no evidence of hydronephrosis.  Chest x-ray shows bilateral effusions without clear focal consolidation, large edema, pneumothorax or other clear acute thoracic process.  Official radiology report(s): US Renal  Result Date: 07/13/2020 CLINICAL DATA:  64 year old female with acute kidney injury EXAM: RENAL / URINARY TRACT ULTRASOUND COMPLETE COMPARISON:  None. FINDINGS: Right Kidney: Renal measurements: 10.6 x 4.7 x 4.6 cm = volume: 120 mL. The renal parenchyma is increased in echogenicity. There is also increased conspicuity between the corticomedullary interface. Circumscribed anechoic simple cyst with imperceptible walls are present. There are 2 measured at 1.9 x 1.8 x 1.9 cm and 1.7 x 1.3 x 1.4 cm. The larger cyst is present exophytic from the lower pole while the smaller cyst is partially exophytic from the interpolar region. No hydronephrosis or nephrolithiasis. Left Kidney: Renal measurements: 10.6 x 5.1 x 4.1 cm = volume: 117 mL. Increased parenchymal echogenicity. No hydronephrosis or nephrolithiasis. Tiny anechoic simple cyst measures 0.8 x 0.8 x 0.6 cm. Bladder: Appears  normal for degree of bladder distention. Other: None. IMPRESSION: 1. No evidence of hydronephrosis. 2. Echogenic renal parenchyma bilaterally consistent with medical renal disease. 3. Simple renal cysts bilaterally. Electronically Signed   By: Jacqulynn Cadet M.D.   On: 07/13/2020 13:09   DG Chest Port 1 View  Result Date: 07/13/2020 CLINICAL DATA:  Questionable sepsis. EXAM: PORTABLE CHEST 1 VIEW COMPARISON:  06/05/2020 and older exams. FINDINGS: There is bilateral lung base opacity obscuring hemidiaphragms consistent with a combination of pleural effusions and atelectasis and/or infection. Upper lungs remain clear. No pneumothorax. Cardiac silhouette is normal in size.  No mediastinal masses. Right anterior chest wall Port-A-Cath is stable. Skeletal structures are grossly intact. IMPRESSION: 1. Bilateral pleural effusions with associated lung base opacity, similar to the prior chest radiograph. Lung base opacities may be due to atelectasis, pneumonia or a combination. No convincing pulmonary edema. No pneumothorax. Electronically Signed  By: Lajean Manes M.D.   On: 07/13/2020 12:10    ____________________________________________   PROCEDURES  Procedure(s) performed (including Critical Care):  .Critical Care Performed by: Lucrezia Starch, MD Authorized by: Lucrezia Starch, MD   Critical care provider statement:    Critical care time (minutes):  45   Critical care time was exclusive of:  Separately billable procedures and treating other patients   Critical care was necessary to treat or prevent imminent or life-threatening deterioration of the following conditions:  Sepsis   Critical care was time spent personally by me on the following activities:  Discussions with consultants, evaluation of patient's response to treatment, examination of patient, ordering and performing treatments and interventions, ordering and review of laboratory studies, ordering and review of radiographic studies,  pulse oximetry, re-evaluation of patient's condition, obtaining history from patient or surrogate and review of old charts     ____________________________________________   INITIAL IMPRESSION / [place] / ED COURSE      Patient presents with left history exam and transferred to the ED for further evaluation management after routine labs prior to outpatient chemoinfusion today were concerning for acute renal failure.  Patient was also noted have an elevated white blood cell count.  On arrival patient is borderline hypotensive with a BP of 88/62 with otherwise stable vital signs on room air.  Is possible she has an infection in her left fifth digit amputation site as there is a little bit of purulent drainage without significant surrounding erythema induration streaking or other skin changes.  Is also difficult to exclude pneumonia and given patient has cough and congestion discussed will be certainly a source of infection.  Given elevated white blood cell count and hypotension with concern for to possible source of infection patient was given broad-spectrum IV antibiotics and blood cultures were obtained and patient was given 30 cc/kg of IV fluids.  We will also plan to check UA to assess for evidence of urinary tract infection although patient denies any urinary symptoms.  Labs obtained earlier today were reviewed by myself.  CMP with evidence of hyponatremia with an NA of 128 compared to 1 3611 days ago as well as an AKI on CKD with a creatinine of 6.58 compared to 2.4 411 days ago.  LFTs are unremarkable and patient has no evidence of significant acidosis.  CBC notable for WBC count of 22.3 with hemoglobin of 8.6 compared to 7.611 days ago.  Initial lactic acid is 1.5.  Given dry mucous membranes decreased appetite poor p.o. intake of last several days as well as ultrasound showing no evidence of hydronephrosis I suspect patient's acute on chronic kidney failure is likely related to some  dehydration and possible sepsis.  She will be admitted to medicine service for further evaluation management.   ____________________________________________   FINAL CLINICAL IMPRESSION(S) / ED DIAGNOSES  Final diagnoses:  AKI (acute kidney injury) ([place])  Cough  Leukocytosis, unspecified type  S/P amputation of lesser toe, left (HCC)  Chronic respiratory failure with hypoxia (HCC)  Sepsis, due to unspecified organism, unspecified whether acute organ dysfunction present (HCC)  Cancer ([place])    Medications  vancomycin (VANCOREADY) IVPB 1750 mg/350 mL (1,750 mg Intravenous New Bag/Given 07/13/20 1417)  acetaminophen-codeine (TYLENOL #4) 300-60 MG per tablet 1 tablet (has no administration in time range)  aspirin EC tablet 81 mg (has no administration in time range)  simvastatin (ZOCOR) tablet 20 mg (has no administration in time range)  ALPRAZolam (XANAX) tablet 0.5  mg (has no administration in time range)  FLUoxetine (PROZAC) capsule 20 mg (has no administration in time range)  calcitRIOL (ROCALTROL) capsule 0.25 mcg (has no administration in time range)  docusate sodium (COLACE) capsule 100 mg (has no administration in time range)  famotidine (PEPCID) tablet 20 mg (has no administration in time range)  senna (SENOKOT) tablet 8.6 mg (has no administration in time range)  ferrous sulfate tablet 325 mg (has no administration in time range)  gabapentin (NEURONTIN) capsule 100 mg (has no administration in time range)  albuterol (VENTOLIN HFA) 108 (90 Base) MCG/ACT inhaler 2 puff (has no administration in time range)  Fluticasone Propionate (Inhal) AEPB 2 puff (has no administration in time range)  heparin injection 5,000 Units (has no administration in time range)  ondansetron (ZOFRAN) tablet 4 mg (has no administration in time range)    Or  ondansetron (ZOFRAN) injection 4 mg (has no administration in time range)  insulin aspart (novoLOG) injection 0-15 Units (has no administration in  time range)  0.9 %  sodium chloride infusion (has no administration in time range)  lactated ringers bolus 1,503 mL (1,503 mLs Intravenous New Bag/Given 07/13/20 1145)  ceFEPIme (MAXIPIME) 2 g in sodium chloride 0.9 % 100 mL IVPB (0 g Intravenous Stopped 07/13/20 1405)     ED Discharge Orders    None       Note:  This document was prepared using Dragon voice recognition software and may include unintentional dictation errors.   Lucrezia Starch, MD 07/13/20 [phone]

## 2020-07-13 NOTE — ED Notes (Addendum)
MD notified of hypoglycemia. Pt alert, oriented. Eating dinner, will recheck sugar after per MD

## 2020-07-13 NOTE — ED Notes (Signed)
Pt states coming in due to abnormal kidney levels and low WBC. Pt states she is a metastatic breast cancer pt going through chemo. Pt noted to have swelling to both legs. Pt is on oxygen at 2.5LPM. Pt states she is on oxygen at home at 3lpm, but is okay keeping oxygen at 2.5LPM at this time.

## 2020-07-13 NOTE — ED Triage Notes (Signed)
Pt to ED from Cancer center. Pt went for chemo treatment today. Pt Cr. Was 6 and Pts BP was 88 systolic. Pt was given 1 liter of fluid at cancer center. Pt is A & O and in NAD at this time.

## 2020-07-13 NOTE — Progress Notes (Signed)
[place] OFFICE PROGRESS NOTE  Patient Care Team: [name], [name], MD as Medical Oncologist (Medical Oncology) Corey Skains, MD as Consulting Physician (Cardiology)  Cancer Staging No matching staging information was found for the patient.   Oncology History Overview Note  # OCT 2015-STAGE IV LEFT BREAST T2N1 [T=4cm; [name]; [name]; [name]; EBUS- Positive Paratrac/subcarinal LN s/p ? Taxotere [in [place]; [name]; [name];[name]; [name]; suv 3]; [name]; [name];[name];  # ? Bony lesions- PET sep 2016-non-hypermetabolic sclerotic lesions T10; Ant R iliac bone; inferior sternum- not on X-geva  # April 2019- PET scan Progression/pleural based lesions effusion/bone lesions.   # TAXOL   # SEP [id number]- July 2021- ERIBULIN; Progression  # SEP-OCT 2021- GEMCITABINE [interrupted because of hospitalization; progressive disease; discontinued]; #September 2021-left foot osteomyelitis s/p metatarsal amputation [Dr.Fowler/ Dr. Ola Spurr  #October 30th 2021 [hospitalization]-CT scan progressive pleural lesion bilateral pleural effusion/bone lesions; cytology negative status post thoracentesis x2; hemoglobin 6.5 s/p PRBC transfusion Discontinue gemcitabine  # NOV 11th 2021-Doxil weekly.  # Poorly controlled Blood sugars- improved.   # Pancreatitis Hx/PEI- on creon in past / CKD IV [creat ~ 3-4; [name]; [name]; mild left sided weakness]  # Jan 2020-  Lobular lesion on tongue- s/p excision pyogenic granuloma [Dr.McQueen]   # GENETIC TESTING/COUNSELLING: HETEROZYGOUS Cystic Fibrosis Gene [explains hx of recurrent  pancreatitis]  # MOLECULAR TESTING: NA   # PALLIATIVE CARE: 1/22-Discussed/Declined ------------------------------------------------   DIAGNOSIS: [ 2015] BREAST CA; [name]; [name] ;GOALS: Palliative  CURRENT/MOST RECENT THERAPY: DOXIL [C].     Carcinoma of upper-inner quadrant of left breast in female, estrogen receptor positive ([place])  04/29/2019 - 02/24/2020 Chemotherapy   The patient had eriBULin mesylate (HAL[place]) 2 mg in sodium chloride 0.9 % 100 mL chemo infusion, 2 mg, Intravenous,  Once, 12 of 14 cycles Dose modification: 1 mg/m2 (original dose 1 mg/m2, Cycle 1, Reason: Provider Judgment) Administration: 2 mg (06/03/2019), 2 mg (04/29/2019), 2 mg (06/10/2019), 2 mg (07/04/2019), 2 mg (07/11/2019), 2 mg (07/25/2019), 2 mg (08/03/2019), 2 mg (08/19/2019), 2 mg (08/26/2019), 2 mg (09/09/2019), 2 mg (09/16/2019), 2 mg (10/07/2019), 2 mg (10/14/2019), 2 mg (10/28/2019), 2 mg (11/04/2019), 2 mg (11/28/2019), 2 mg (12/09/2019), 2 mg (01/13/2020), 2 mg (01/20/2020), 2 mg (02/02/2020), 2 mg (02/13/2020), 2 mg (02/24/2020)  for chemotherapy treatment.    04/30/2020 - 05/28/2020 Chemotherapy   The patient had gemcitabine (GEMZAR) 1,600 mg in sodium chloride 0.9 % 250 mL chemo infusion, 1,596 mg, Intravenous,  Once, 2 of 5 cycles Administration: 1,600 mg (04/30/2020), 1,600 mg (05/07/2020), 1,600 mg (05/28/2020)  for chemotherapy treatment.    06/15/2020 -  Chemotherapy   The patient had DOXOrubicin HCL LIPOSOMAL (DOXIL) 30 mg in dextrose 5 % 250 mL chemo infusion, 15 mg/m2 = 30 mg, Intravenous,  Once, 3 of 7 cycles Administration: 30 mg (06/15/2020), 30 mg (06/22/2020), 30 mg (07/02/2020)  for chemotherapy treatment.     INTERVAL HISTORY:  [name] 64 y.o.  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  In the interim patient has not  had any hospitalizations. No fevers or chills.    She recently evaluated by podiatry/wound care noted to have a wound on the right leg/sole; she had wound care done.  Today she feels dry in her mouth.  No nausea.  No vomiting.  No worsening pain.  C mild to moderate fatigue.  Review of Systems  Constitutional: Positive for malaise/fatigue and weight loss. Negative for chills, diaphoresis and fever.  HENT: Negative for nosebleeds and sore throat.   Eyes: Negative for double vision.  Respiratory: Negative for cough, hemoptysis, sputum production, [name], palpitations and orthopnea.  Gastrointestinal: Negative for abdominal pain, blood in stool, constipation, diarrhea, heartburn, melena, nausea and vomiting.  Genitourinary: Negative for dysuria, frequency and urgency.  Musculoskeletal: Positive for back pain and joint pain.  Skin: Negative.  Negative for itching and rash.  Neurological: Positive for tingling. Negative for dizziness, focal weakness, weakness and headaches.  Endo/Heme/Allergies: Does not bruise/bleed easily.  Psychiatric/Behavioral: Negative for depression. The patient is not nervous/anxious and does not have insomnia.     PAST MEDICAL HISTORY :  Past Medical History:  Diagnosis Date  . Anemia   . Anxiety   . Asthma   . Cancer ([place]) 03/10/2018   Per NM PET order. Carcinoma of upper-inner quadrant of left breast in female, estrogen receptor positive .  Marland Kitchen Cancer (HCC)    LUNG  . CHF (congestive heart failure) ([place]) 1997  . CKD (chronic kidney disease)   . Depression   . Diabetes mellitus, type 2 ([place])   . Family history of breast cancer   . Family history of colon cancer   . Family history of ovarian cancer   . Family history of pancreatic cancer   . Family history of prostate cancer   . Family history of stomach cancer   . GERD (gastroesophageal reflux disease)    history of an ulcer  . Hair loss   . History of left breast cancer 05/29/14   . History of partial hysterectomy 12/31/2016   Per patient.  Has not had a period in years.  Had a partial hysterectomy years ago.  Marland Kitchen Hypertension   . Mitral valve regurgitation   . Neuromuscular disorder (HCC)    neuropathies in hand  . Obesity   . Pancreatitis 1997  . Stroke [facility]) 2010   with mild left arm weakness    PAST SURGICAL HISTORY :   Past Surgical History:  Procedure Laterality Date  . AMPUTATION Left 03/30/2020   Procedure: AMPUTATION 5th RAY;  Surgeon: Samara Deist, DPM;  Location: [facility] ORS;  Service: Podiatry;  Laterality: Left;  . APPLICATION OF WOUND VAC Left 03/30/2020   Procedure: APPLICATION OF WOUND VAC;  Surgeon: Samara Deist, DPM;  Location: [facility] ORS;  Service: Podiatry;  Laterality: Left;  . CATARACT EXTRACTION W/PHACO Right 02/24/2019   Procedure: CATARACT EXTRACTION PHACO AND INTRAOCULAR LENS PLACEMENT ([place]) RIGHT DIABETES;  Surgeon: Marchia Meiers, MD;  Location: [facility] ORS;  Service: Ophthalmology;  Laterality: Right;  [place] 01:13.0 CDE 7.96 Fluid Pack Lot # [id number]  . CATARACT EXTRACTION W/PHACO Left 03/24/2019   Procedure: CATARACT EXTRACTION PHACO AND INTRAOCULAR LENS PLACEMENT ([facility]) - left diabetic;  Surgeon: Marchia Meiers, MD;  Location: [facility] ORS;  Service: Ophthalmology;  Laterality: Left;  [place]  01:36 CDE 13.93 Fluid pack lot # [id number]  . CENTRAL LINE INSERTION-TUNNELED N/A 04/04/2020   Procedure: CENTRAL LINE INSERTION-TUNNELED;  Surgeon: Katha Cabal, MD;  Location: [facility]  INVASIVE CV LAB;  Service: Cardiovascular;  Laterality: N/A;  . CESAREAN SECTION    . CHOLECYSTECTOMY    . DIALYSIS/PERMA CATHETER REMOVAL N/A 05/01/2020   Procedure: DIALYSIS/PERMA CATHETER REMOVAL;  Surgeon: Katha Cabal, MD;  Location: [place] CV LAB;  Service: Cardiovascular;  Laterality: N/A;  . EXCISION OF TONGUE LESION N/A 08/17/2018   Procedure: EXCISION OF TONGUE LESION WITH FROZEN SECTIONS;  Surgeon: Beverly Gust, MD;  Location: [facility] ORS;  Service: ENT;   Laterality: N/A;  . EYE SURGERY Right    cataract extraction  . IRRIGATION AND DEBRIDEMENT FOOT Left 03/30/2020   Procedure: IRRIGATION AND DEBRIDEMENT FOOT;  Surgeon: Samara Deist, DPM;  Location: [facility] ORS;  Service: Podiatry;  Laterality: Left;  . PARTIAL HYSTERECTOMY  12/31/2016   Per patient, she has not had a period in years since she had a partial hysterectomy.  Marland Kitchen PORTA CATH INSERTION    . TUBAL LIGATION      FAMILY HISTORY :   Family History  Problem Relation Age of Onset  . Ovarian cancer Mother 71  . Diabetes Mother   . Hypertension Mother   . COPD Father   . Hypertension Father   . Colon cancer Father 52  . Diabetes Sister   . Breast cancer Sister 54       bilateral  . Diabetes Brother   . Leukemia Maternal Aunt   . Pancreatic cancer Paternal Aunt 22  . Pancreatic cancer Paternal Uncle   . Colon cancer Paternal Uncle   . Stomach cancer Maternal Grandfather 43  . Throat cancer Paternal Grandmother   . Breast cancer Maternal Aunt 80  . Colon cancer Maternal Aunt   . Bone cancer Maternal Aunt   . Breast cancer Paternal Aunt        dx >50  . Prostate cancer Paternal Uncle   . Pancreatic cancer Paternal Uncle   . Throat cancer Paternal Uncle   . Lung cancer Paternal Uncle   . Stomach cancer Paternal Uncle   . Brain cancer Paternal Aunt   . Cancer Cousin        liver, kidney  . Prostate cancer Cousin        meastatic  . Lung cancer Other     SOCIAL HISTORY:   Social History   Tobacco Use  . Smoking status: Former Smoker    Packs/day: 0.50    Years: 1.00    Pack years: 0.50    Types: Cigarettes  . Smokeless tobacco: Never Used  Vaping Use  . Vaping Use: Never used  Substance Use Topics  . Alcohol use: No    Alcohol/week: 0.0 standard drinks  . Drug use: No    ALLERGIES:  is allergic to fish-derived products, sulfamethoxazole-trimethoprim, and chlorhexidine.  MEDICATIONS:  No current facility-administered medications for this visit.   Current  Outpatient Medications  Medication Sig Dispense Refill  . acetaminophen-codeine (TYLENOL #4) 300-60 MG tablet Take 1 tablet by mouth 2 (two) times daily as needed for moderate pain.    Marland Kitchen albuterol (VENTOLIN HFA) 108 (90 Base) MCG/ACT inhaler Inhale 2 puffs into the lungs every 6 (six) hours as needed for wheezing or shortness of breath.     . ALPRAZolam (XANAX) 0.5 MG tablet Take 0.5 mg by mouth 2 (two) times daily as needed for anxiety or sleep.     Marland Kitchen amLODipine (NORVASC) 5 MG tablet Take 5 mg by mouth daily.    Marland Kitchen aspirin EC 81 MG tablet Take 81 mg by  mouth daily.     Marland Kitchen atenolol (TENORMIN) 100 MG tablet Take 100 mg by mouth 2 (two) times daily.    . calcitRIOL (ROCALTROL) 0.25 MCG capsule Take 0.25 mcg by mouth daily.     . Cinnamon 500 MG capsule Take 500 mg by mouth 2 (two) times daily.     . cloNIDine (CATAPRES) 0.1 MG tablet Take 0.1 mg by mouth 2 (two) times daily.    Marland Kitchen docusate sodium (COLACE) 100 MG capsule Take 1 capsule (100 mg total) by mouth 2 (two) times daily. (Patient taking differently: Take 100 mg by mouth daily.) 10 capsule 0  . enalapril (VASOTEC) 10 MG tablet Take 20 mg by mouth 2 (two) times a day.     . famotidine (PEPCID) 20 MG tablet Take 20 mg by mouth 2 (two) times daily.     . ferrous sulfate 325 (65 FE) MG tablet Take 325 mg by mouth daily with breakfast.    . FLUoxetine (PROZAC) 20 MG capsule Take 20 mg by mouth 2 (two) times daily.     Marland Kitchen gabapentin (NEURONTIN) 100 MG capsule TAKE 1 CAPSULE(100 MG) BY MOUTH AT BEDTIME (Patient taking differently: Take 100 mg by mouth at bedtime.) 30 capsule 6  . glyBURIDE (DIABETA) 5 MG tablet Take 10 mg by mouth 2 (two) times daily with a meal.     . LEVEMIR FLEXTOUCH 100 UNIT/ML FlexPen Inject 25 Units into the skin daily. 15 mL 11  . NOVOLOG FLEXPEN 100 UNIT/ML FlexPen Inject 5 Units into the skin 3 (three) times daily with meals. (Patient taking differently: Inject 7 Units into the skin 2 (two) times daily with a meal.) 15 mL 11   . senna (SENOKOT) 8.6 MG TABS tablet Take 1 tablet (8.6 mg total) by mouth 2 (two) times daily as needed for mild constipation or moderate constipation. 120 tablet 0  . simvastatin (ZOCOR) 20 MG tablet Take 20 mg by mouth every evening.     . bumetanide (BUMEX) 1 MG tablet Take 1 mg by mouth 2 (two) times daily.     Facility-Administered Medications Ordered in Other Visits  Medication Dose Route Frequency Provider Last Rate Last Admin  . 0.9 %  sodium chloride infusion   Intravenous Continuous Agbata, Tochukwu, MD      . acetaminophen-codeine (TYLENOL #4) 300-60 MG per tablet 1 tablet  1 tablet Oral BID Agbata, Tochukwu, MD      . albuterol (VENTOLIN HFA) 108 (90 Base) MCG/ACT inhaler 2 puff  2 puff Inhalation Q6H PRN Agbata, Tochukwu, MD      . ALPRAZolam Duanne Moron) tablet 0.5 mg  0.5 mg Oral BID PRN Agbata, Tochukwu, MD      . aspirin EC tablet 81 mg  81 mg Oral Daily Agbata, Tochukwu, MD      . calcitRIOL (ROCALTROL) capsule 0.25 mcg  0.25 mcg Oral Daily Agbata, Tochukwu, MD      . docusate sodium (COLACE) capsule 100 mg  100 mg Oral Daily Agbata, Tochukwu, MD      . famotidine (PEPCID) tablet 20 mg  20 mg Oral BID Agbata, Tochukwu, MD      . Derrill Memo ON 07/14/2020] ferrous sulfate tablet 325 mg  325 mg Oral Q breakfast Agbata, Tochukwu, MD      . FLUoxetine (PROZAC) capsule 20 mg  20 mg Oral BID Agbata, Tochukwu, MD      . Fluticasone Propionate (Inhal) AEPB 2 puff  2 puff Inhalation Q4H Agbata, Tochukwu, MD      .  gabapentin (NEURONTIN) capsule 100 mg  100 mg Oral QHS Agbata, Tochukwu, MD      . heparin injection 5,000 Units  5,000 Units Subcutaneous Q8H Agbata, Tochukwu, MD      . insulin aspart (novoLOG) injection 0-15 Units  0-15 Units Subcutaneous TID WC Agbata, Tochukwu, MD      . ondansetron (ZOFRAN) tablet 4 mg  4 mg Oral Q6H PRN Agbata, Tochukwu, MD       Or  . ondansetron (ZOFRAN) injection 4 mg  4 mg Intravenous Q6H PRN Agbata, Tochukwu, MD      . senna (SENOKOT) tablet 8.6 mg  1  tablet Oral BID PRN Agbata, Tochukwu, MD      . simvastatin (ZOCOR) tablet 20 mg  20 mg Oral QPM Agbata, Tochukwu, MD      . sodium chloride flush (NS) 0.9 % injection 10 mL  10 mL Intravenous PRN Cammie Sickle, MD   10 mL at 01/30/16 1054    PHYSICAL EXAMINATION: ECOG PERFORMANCE STATUS: 1 - Symptomatic but completely ambulatory  BP (!) 88/62 (BP Location: Right Arm, Patient Position: Sitting)   Pulse 71   Temp (!) 96.9 F (36.1 C) (Tympanic)   Resp 20   Ht 5' 2" (1.575 m)   Wt 163 lb 12.8 oz (74.3 kg)   SpO2 99% Comment: on 3L  BMI 29.96 kg/m   Filed Weights   07/13/20 0855  Weight: 163 lb 12.8 oz (74.3 kg)    Physical Exam Constitutional:      Comments: She is alone.  Patient in wheelchair.  HENT:     Head: Normocephalic and atraumatic.     Mouth/Throat:     Pharynx: No oropharyngeal exudate.  Eyes:     Pupils: Pupils are equal, round, and reactive to light.  Cardiovascular:     Rate and Rhythm: Normal rate and regular rhythm.  Pulmonary:     Effort: No respiratory distress.     Breath sounds: No wheezing.     Comments: Decreased breath sounds right more than left. Abdominal:     General: Bowel sounds are normal. There is no distension.     Palpations: Abdomen is soft. There is no mass.     Tenderness: There is no abdominal tenderness. There is no guarding or rebound.  Musculoskeletal:        General: No tenderness. Normal range of motion.     Cervical back: Normal range of motion and neck supple.  Skin:    General: Skin is warm.     Comments: Bilateral feet bandaged.  Neurological:     Mental Status: She is alert and oriented to person, place, and time.  Psychiatric:        Mood and Affect: Affect normal.     LABORATORY DATA:  I have reviewed the data as listed    Component Value Date/Time   NA 128 (L) 07/13/2020 0825   NA 130 (L) 06/06/2014 1102   K 5.1 07/13/2020 0825   K 3.9 06/06/2014 1102   CL 88 (L) 07/13/2020 0825   CL 95 (L)  06/06/2014 1102   CO2 26 07/13/2020 0825   CO2 28 06/06/2014 1102   GLUCOSE 187 (H) 07/13/2020 0825   GLUCOSE 349 (H) 06/06/2014 1102   BUN 107 (H) 07/13/2020 0825   BUN 17 06/06/2014 1102   CREATININE 6.58 (H) 07/13/2020 0825   CREATININE 1.63 (H) 06/06/2014 1102   CALCIUM 9.0 07/13/2020 0825   CALCIUM 9.2 06/06/2014 1102  PROT 7.2 07/13/2020 0825   PROT 8.2 06/06/2014 1102   ALBUMIN 2.2 (L) 07/13/2020 0825   ALBUMIN 3.3 (L) 06/06/2014 1102   AST 14 (L) 07/13/2020 0825   AST 7 (L) 06/06/2014 1102   ALT 12 07/13/2020 0825   ALT 12 (L) 06/06/2014 1102   ALKPHOS 90 07/13/2020 0825   ALKPHOS 74 06/06/2014 1102   BILITOT 0.8 07/13/2020 0825   BILITOT 0.4 06/06/2014 1102   GFRNONAA 7 (L) 07/13/2020 0825   GFRNONAA 35 (L) 06/06/2014 1102   GFRAA 24 (L) 05/07/2020 0807   GFRAA 42 (L) 06/06/2014 1102    No results found for: SPEP, UPEP  Lab Results  Component Value Date   WBC 22.3 (H) 07/13/2020   NEUTROABS 19.8 (H) 07/13/2020   HGB 8.6 (L) 07/13/2020   HCT 25.0 (L) 07/13/2020   MCV 88.3 07/13/2020   PLT 358 07/13/2020      Chemistry      Component Value Date/Time   NA 128 (L) 07/13/2020 0825   NA 130 (L) 06/06/2014 1102   K 5.1 07/13/2020 0825   K 3.9 06/06/2014 1102   CL 88 (L) 07/13/2020 0825   CL 95 (L) 06/06/2014 1102   CO2 26 07/13/2020 0825   CO2 28 06/06/2014 1102   BUN 107 (H) 07/13/2020 0825   BUN 17 06/06/2014 1102   CREATININE 6.58 (H) 07/13/2020 0825   CREATININE 1.63 (H) 06/06/2014 1102      Component Value Date/Time   CALCIUM 9.0 07/13/2020 0825   CALCIUM 9.2 06/06/2014 1102   ALKPHOS 90 07/13/2020 0825   ALKPHOS 74 06/06/2014 1102   AST 14 (L) 07/13/2020 0825   AST 7 (L) 06/06/2014 1102   ALT 12 07/13/2020 0825   ALT 12 (L) 06/06/2014 1102   BILITOT 0.8 07/13/2020 0825   BILITOT 0.4 06/06/2014 1102         RADIOGRAPHIC STUDIES: I have personally reviewed the radiological images as listed and agreed with the findings in the  report. US Renal  Result Date: 07/13/2020 CLINICAL DATA:  64 year old female with acute kidney injury EXAM: RENAL / URINARY TRACT ULTRASOUND COMPLETE COMPARISON:  None. FINDINGS: Right Kidney: Renal measurements: 10.6 x 4.7 x 4.6 cm = volume: 120 mL. The renal parenchyma is increased in echogenicity. There is also increased conspicuity between the corticomedullary interface. Circumscribed anechoic simple cyst with imperceptible walls are present. There are 2 measured at 1.9 x 1.8 x 1.9 cm and 1.7 x 1.3 x 1.4 cm. The larger cyst is present exophytic from the lower pole while the smaller cyst is partially exophytic from the interpolar region. No hydronephrosis or nephrolithiasis. Left Kidney: Renal measurements: 10.6 x 5.1 x 4.1 cm = volume: 117 mL. Increased parenchymal echogenicity. No hydronephrosis or nephrolithiasis. Tiny anechoic simple cyst measures 0.8 x 0.8 x 0.6 cm. Bladder: Appears normal for degree of bladder distention. Other: None. IMPRESSION: 1. No evidence of hydronephrosis. 2. Echogenic renal parenchyma bilaterally consistent with medical renal disease. 3. Simple renal cysts bilaterally. Electronically Signed   By: Jacqulynn Cadet M.D.   On: 07/13/2020 13:09   DG Chest Port 1 View  Result Date: 07/13/2020 CLINICAL DATA:  Questionable sepsis. EXAM: PORTABLE CHEST 1 VIEW COMPARISON:  06/05/2020 and older exams. FINDINGS: There is bilateral lung base opacity obscuring hemidiaphragms consistent with a combination of pleural effusions and atelectasis and/or infection. Upper lungs remain clear. No pneumothorax. Cardiac silhouette is normal in size.  No mediastinal masses. Right anterior chest wall Port-A-Cath is stable.  Skeletal structures are grossly intact. IMPRESSION: 1. Bilateral pleural effusions with associated lung base opacity, similar to the prior chest radiograph. Lung base opacities may be due to atelectasis, pneumonia or a combination. No convincing pulmonary edema. No pneumothorax.  Electronically Signed   By: Lajean Manes M.D.   On: 07/13/2020 12:10     ASSESSMENT & PLAN:  Carcinoma of upper-inner quadrant of left breast in female, estrogen receptor positive ([place]) # stage IV breast cancer ER/PR positive HER-2 negative -October 30 CT scan-progression of disease-pleural metastases/bilateral effusion; worsening lytic lesions in the bones.  Currently on weekly DOXIL.  # HOLD  #4 weekly DOXIL today. Labs today reviewed;  UNacceptable for treatment today- see below.   #Leukocytosis white count 22 [no steroids/or growth factors]-hypotension 80 systolic/acute renal failure-?  High suspicion for underlying infection.  Recommend IV fluids 1 L.  Discussed with hospitalist service; no beds available for direct admit.  Recommend evaluation in the emergency room.  # Bilateral pleural effusion status post thoracentesis [OCT 2021]-cytology negative; on O2 nasal cannula.  Will need further work-up.  # Anemia- sec to chemo-CKD- IV- hemoglobin-8.6; STABLE.    # acute Renal failyure on Chronic kidney disease/CKD-IV [baseline creatinine 2.5; today creatinine 6.5/potassium 5.1; sodium 128] Underlying infection/diuresis versus others.  Evaluation emergency room/nephrology evaluation.  # Bone mets-sclerotic; Right acetabular uptake-s/p radiation; CT scan shows worsening metastases in the vertebrae; currently asymptomatic-STABLE  #Unfortunately patient's prognosis is grim/given multiple comorbidities poor tolerance to therapy.  Previously evaluated by palliative care; continues to be on full code.  We will revisit-CODE STATUS/goals of care again at this admission.   # DISPOSITION:  # HOLD chemo today; # IVFs- 1 lit over 1 hour.  # keep appt for as planned-Dec 17th- MD; labs- cbc/cmp;ca-27-29;-DOXIL; HOLD tube;- Dr.B     No orders of the defined types were placed in this encounter.  All questions were answered. The patient knows to call the clinic with any problems, questions or  concerns.      Cammie Sickle, MD 07/13/2020 5:27 PM

## 2020-07-13 NOTE — ED Notes (Signed)
Pt noted to have a wound to the left lateral foot, 6cm in length. No discharge noted. Pt states she does see a wound care provider. Wet to dry dressing placed on foot.

## 2020-07-13 NOTE — Progress Notes (Signed)
CODE SEPSIS - PHARMACY COMMUNICATION  **Broad Spectrum Antibiotics should be administered within 1 hour of Sepsis diagnosis**  Time Code Sepsis Called/Page Received: 1200  Antibiotics Ordered: vancomycin and Cefepime  Time of 1st antibiotic administration: [place], PharmD, BCPS Clinical Pharmacist 07/13/2020 12:12 PM

## 2020-07-13 NOTE — Progress Notes (Signed)
Rcvd 1 liter of NS in [facility] infusion. BP improved post fluids. Pt on 3 liters of O2 via [place]. No dyspnea. Pt being sent to ED for acute renal failure. Pt taken to ED in wheelchair by RN, Nira Conn called report to Triage in ED. Port R chest remains accessed and saline locked.

## 2020-07-13 NOTE — Consult Note (Signed)
PHARMACY -  BRIEF ANTIBIOTIC NOTE   Pharmacy has received consult(s) for Cefepime from an ED provider.  The patient's profile has been reviewed for ht/wt/allergies/indication/available labs.    One time order(s) placed for Cefepime 2g IV x 1  Further antibiotics/pharmacy consults should be ordered by admitting physician if indicated.                       Thank you,  Lu Duffel, PharmD, BCPS Clinical Pharmacist 07/13/2020 11:49 AM

## 2020-07-13 NOTE — Consult Note (Signed)
PHARMACY -  BRIEF ANTIBIOTIC NOTE   Pharmacy has received consult(s) for Cefepime and vancomycin from an ED provider.  The patient's profile has been reviewed for ht/wt/allergies/indication/available labs.    One time order(s) placed for Cefepime 2g IV  and vancomycin 1750mg   Further antibiotics/pharmacy consults should be ordered by admitting physician if indicated.                       Thank you,  Pernell Dupre, PharmD, BCPS Clinical Pharmacist 07/13/2020 12:08 PM

## 2020-07-13 NOTE — Progress Notes (Signed)
Patient here for oncology follow-up appointment, expresses concerns of low BP, dry mouth, stiffness and nasal congestion.

## 2020-07-13 NOTE — H&P (Addendum)
History and Physical    [name] GGY:[id number] DOB: 1955/08/19 DOA: 07/13/2020  PCP: [name], MD   Patient coming from: Home  I have personally briefly reviewed patient's old medical records in [place]  Chief Complaint: Weakness  HPI: [name] is a 64 y.o. female with medical history significant for stage IV breast cancer on chemotherapy, stage IV chronic kidney disease, depression, chronic respiratory failure, hypertension, GERD who was sent to the emergency room from the cancer center for evaluation of abnormal labs.  Patient had gone to the cancer center for chemotherapy and labs drawn showed worsening of her renal function from baseline.  At baseline she has a serum creatinine of 2.4 but today on admission her serum creatinine was 6.5.  Patient states that she cut back on her fluid intake because she recently had thoracentesis done in the hospital with drainage of about a liter of fluid from both lungs.  She states that her mouth stays dry but denies having any urinary frequency or nocturia.  She denies feeling dizzy or lightheaded and had a blood pressure in the 80s at the cancer center for which she received 1 L of IV fluid. She has a cough productive of clear phlegm. She denies having any fever or chills, no abdominal pain, no chest pain, no nausea, no vomiting, no diaphoresis or palpitations. Labs show sodium 128, potassium 5.1, chloride 88, bicarb 26, glucose 180 BUN 107, creatinine 6.58 above her baseline of 2.4, calcium 9.7, alkaline phosphatase 90, albumin 2.2, AST 14, ALT 12, total protein 7.2, lactic acid 1.5, white count 22.3, hemoglobin 8.6, hematocrit 25, MCV 88, RDW 15.6, platelet count 358 Chest x-ray shows bilateral pleural effusions with associated lung base opacity, similar to prior chest x-ray. Renal ultrasound shows no evidence of hydronephrosis.  Echogenic renal parenchyma bilaterally consistent with kidney disease. Twelve-lead EKG shows  sinus rhythm   ED Course: Patient is a 64 year old African-American female with a history of metastatic breast cancer who was sent to the emergency room from the cancer center for evaluation of abnormal labs.  Patient had gone for his scheduled palliative chemotherapy but was noted to be hypotensive with systolic blood pressure in the 80s.  She received 1 L of IV fluid in the cancer center.  Labs revealed worsening of her renal function from baseline.  At baseline she has a serum creatinine of 2.4 but today on admission it is 6.5, BUN is also elevated at 107.  She has marked leukocytosis with unclear source of an infection at this time, still awaiting results of urine analysis. Patient received IV vancomycin and cefepime in the emergency room. She will be admitted to the hospital for further evaluation.  Review of Systems: As per HPI otherwise 10 point review of systems negative.    Past Medical History:  Diagnosis Date  . Anemia   . Anxiety   . Asthma   . Cancer ([place]) 03/10/2018   Per [facility] PET order. Carcinoma of upper-inner quadrant of left breast in female, estrogen receptor positive .  Marland Kitchen Cancer (HCC)    LUNG  . CHF (congestive heart failure) ([place]) 1997  . CKD (chronic kidney disease)   . Depression   . Diabetes mellitus, type 2 ([place])   . Family history of breast cancer   . Family history of colon cancer   . Family history of ovarian cancer   . Family history of pancreatic cancer   . Family history of prostate cancer   .  Family history of stomach cancer   . GERD (gastroesophageal reflux disease)    history of an ulcer  . Hair loss   . History of left breast cancer 05/29/14  . History of partial hysterectomy 12/31/2016   Per patient.  Has not had a period in years.  Had a partial hysterectomy years ago.  Marland Kitchen Hypertension   . Mitral valve regurgitation   . Neuromuscular disorder (HCC)    neuropathies in hand  . Obesity   . Pancreatitis 1997  . Stroke [facility]) 2010   with mild  left arm weakness    Past Surgical History:  Procedure Laterality Date  . AMPUTATION Left 03/30/2020   Procedure: AMPUTATION 5th RAY;  Surgeon: Samara Deist, DPM;  Location: [facility] ORS;  Service: Podiatry;  Laterality: Left;  . APPLICATION OF WOUND VAC Left 03/30/2020   Procedure: APPLICATION OF WOUND VAC;  Surgeon: Samara Deist, DPM;  Location: [facility] ORS;  Service: Podiatry;  Laterality: Left;  . CATARACT EXTRACTION W/PHACO Right 02/24/2019   Procedure: CATARACT EXTRACTION PHACO AND INTRAOCULAR LENS PLACEMENT ([place]) RIGHT DIABETES;  Surgeon: Marchia Meiers, MD;  Location: [facility] ORS;  Service: Ophthalmology;  Laterality: Right;  [place] 01:13.0 CDE 7.96 Fluid Pack Lot # [id number]  . CATARACT EXTRACTION W/PHACO Left 03/24/2019   Procedure: CATARACT EXTRACTION PHACO AND INTRAOCULAR LENS PLACEMENT ([facility]) - left diabetic;  Surgeon: Marchia Meiers, MD;  Location: [facility] ORS;  Service: Ophthalmology;  Laterality: Left;  [place]  01:36 CDE 13.93 Fluid pack lot # [id number]  . CENTRAL LINE INSERTION-TUNNELED N/A 04/04/2020   Procedure: CENTRAL LINE INSERTION-TUNNELED;  Surgeon: Katha Cabal, MD;  Location: [place] [facility];  Service: Cardiovascular;  Laterality: N/A;  . CESAREAN SECTION    . CHOLECYSTECTOMY    . DIALYSIS/PERMA CATHETER REMOVAL N/A 05/01/2020   Procedure: DIALYSIS/PERMA CATHETER REMOVAL;  Surgeon: Katha Cabal, MD;  Location: [place] [facility];  Service: Cardiovascular;  Laterality: N/A;  . EXCISION OF TONGUE LESION N/A 08/17/2018   Procedure: EXCISION OF TONGUE LESION WITH FROZEN SECTIONS;  Surgeon: Beverly Gust, MD;  Location: [facility] ORS;  Service: ENT;  Laterality: N/A;  . EYE SURGERY Right    cataract extraction  . IRRIGATION AND DEBRIDEMENT FOOT Left 03/30/2020   Procedure: IRRIGATION AND DEBRIDEMENT FOOT;  Surgeon: Samara Deist, DPM;  Location: [facility] ORS;  Service: Podiatry;  Laterality: Left;  . PARTIAL HYSTERECTOMY  12/31/2016   Per patient, she has not had a period in years  since she had a partial hysterectomy.  Marland Kitchen PORTA CATH INSERTION    . TUBAL LIGATION       reports that she has quit smoking. Her smoking use included cigarettes. She has a 0.50 pack-year smoking history. She has never used smokeless tobacco. She reports that she does not drink alcohol and does not use drugs.  Allergies  Allergen Reactions  . Fish-Derived Products Anaphylaxis  . Sulfamethoxazole-Trimethoprim Other (See Comments)    Caused kidney issues  . Chlorhexidine     Family History  Problem Relation Age of Onset  . Ovarian cancer Mother 56  . Diabetes Mother   . Hypertension Mother   . COPD Father   . Hypertension Father   . Colon cancer Father 75  . Diabetes Sister   . Breast cancer Sister 68       bilateral  . Diabetes Brother   . Leukemia Maternal Aunt   . Pancreatic cancer Paternal Aunt 22  . Pancreatic cancer Paternal Uncle   . Colon cancer Paternal  Uncle   . Stomach cancer Maternal Grandfather 79  . Throat cancer Paternal Grandmother   . Breast cancer Maternal Aunt 80  . Colon cancer Maternal Aunt   . Bone cancer Maternal Aunt   . Breast cancer Paternal Aunt        dx >50  . Prostate cancer Paternal Uncle   . Pancreatic cancer Paternal Uncle   . Throat cancer Paternal Uncle   . Lung cancer Paternal Uncle   . Stomach cancer Paternal Uncle   . Brain cancer Paternal Aunt   . Cancer Cousin        liver, kidney  . Prostate cancer Cousin        meastatic  . Lung cancer Other      Prior to Admission medications   Medication Sig Start Date End Date Taking? Authorizing Provider  acetaminophen-codeine (TYLENOL #4) 300-60 MG tablet Take 1 tablet by mouth in the morning and at bedtime.    [provider]  albuterol (VENTOLIN HFA) 108 (90 Base) MCG/ACT inhaler Inhale 2 puffs into the lungs every 6 (six) hours as needed for wheezing or shortness of breath.  06/20/14   [provider]  ALPRAZolam Duanne Moron) 0.5 MG tablet Take 0.5 mg by mouth 2 (two)  times daily as needed for anxiety or sleep.     [provider]  amLODipine (NORVASC) 5 MG tablet  06/14/20   [provider]  aspirin EC 81 MG tablet Take 81 mg by mouth daily.  06/21/14   [provider]  atenolol (TENORMIN) 50 MG tablet Take 50 mg by mouth 2 (two) times daily.  04/07/14   [provider]  B-D ULTRAFINE III SHORT PEN 31G X 8 MM MISC Inject into the skin 4 (four) times daily. 01/22/20   [provider]  bumetanide (BUMEX) 1 MG tablet Take 1 tablet (1 mg total) by mouth 2 (two) times daily. 06/06/20 07/06/20  Sidney Ace, MD  calcitRIOL (ROCALTROL) 0.25 MCG capsule Take 0.25 mcg by mouth daily.  09/21/16   [provider]  Cinnamon 500 MG capsule Take 500 mg by mouth 2 (two) times daily.     [provider]  cloNIDine (CATAPRES) 0.2 MG tablet Take 0.2 mg by mouth 2 (two) times daily.  06/21/14   [provider]  docusate sodium (COLACE) 100 MG capsule Take 1 capsule (100 mg total) by mouth 2 (two) times daily. Patient taking differently: Take 100 mg by mouth daily. 04/06/20   Enzo Bi, MD  enalapril (VASOTEC) 10 MG tablet Take 20 mg by mouth 2 (two) times a day.  01/12/19   [provider]  famotidine (PEPCID) 20 MG tablet Take 20 mg by mouth 2 (two) times daily.  09/14/19   [provider]  ferrous sulfate 325 (65 FE) MG tablet Take 325 mg by mouth daily with breakfast.    [provider]  FLUoxetine (PROZAC) 20 MG capsule Take 20 mg by mouth 2 (two) times daily.  06/21/14   [provider]  Fluticasone Propionate, Inhal, (FLOVENT DISKUS) 100 MCG/BLIST AEPB Inhale 2 puffs into the lungs in the morning and at bedtime.    [provider]  gabapentin (NEURONTIN) 100 MG capsule TAKE 1 CAPSULE(100 MG) BY MOUTH AT BEDTIME Patient taking differently: Take 100 mg by mouth at bedtime. 11/04/19   Cammie Sickle, MD  glyBURIDE (DIABETA) 5 MG tablet Take 10 mg by mouth 2  (two) times daily with a meal.  06/20/14  [provider]  LEVEMIR FLEXTOUCH 100 UNIT/ML FlexPen Inject 25 Units into the skin daily. 04/06/20   Enzo Bi, MD  NOVOLOG FLEXPEN 100 UNIT/ML FlexPen Inject 5 Units into the skin 3 (three) times daily with meals. Patient taking differently: Inject 7 Units into the skin 2 (two) times daily with a meal. 04/06/20   Enzo Bi, MD  senna (SENOKOT) 8.6 MG TABS tablet Take 1 tablet (8.6 mg total) by mouth 2 (two) times daily as needed for mild constipation or moderate constipation. 04/06/20   Enzo Bi, MD  simvastatin (ZOCOR) 20 MG tablet Take 20 mg by mouth every evening.     [provider]    Physical Exam: Vitals:   07/13/20 1245 07/13/20 1300 07/13/20 1315 07/13/20 1330  BP: 115/69 102/67 (!) 98/57 (!) 93/55  Pulse: 72 70 69 68  Resp: (!) 26 (!) 26 (!) 28 (!) 23  Temp:      TempSrc:      SpO2: 90% 100% 100% 100%     Vitals:   07/13/20 1245 07/13/20 1300 07/13/20 1315 07/13/20 1330  BP: 115/69 102/67 (!) 98/57 (!) 93/55  Pulse: 72 70 69 68  Resp: (!) 26 (!) 26 (!) 28 (!) 23  Temp:      TempSrc:      SpO2: 90% 100% 100% 100%    Constitutional: NAD, alert and oriented x 3.  Chronically ill-appearing, looks older than stated age Eyes: PERRL, lids and conjunctivae pallor ENMT: Mucous membranes are dry.  Neck: normal, supple, no masses, no thyromegaly Respiratory: Rales at the bases, no wheezing, no crackles. Normal respiratory effort. No accessory muscle use.  Cardiovascular: Regular rate and rhythm, no murmurs / rubs / gallops. 3+ extremity edema. 2+ pedal pulses. No carotid bruits.  Abdomen: no tenderness, no masses palpated. No hepatosplenomegaly. Bowel sounds positive.  Central adiposity Musculoskeletal: no clubbing / cyanosis. No joint deformity upper and lower extremities.  Status post amputation of left toe has a nonhealing wound which appears dry without any drainage or evidence of infection Skin: no rashes, lesions,  ulcers.  Neurologic: No gross focal neurologic deficit.  Generalized weakness Psychiatric: Normal mood and affect.   Labs on Admission: I have personally reviewed following labs and imaging studies  CBC: Recent Labs  Lab 07/13/20 0825  WBC 22.3*  NEUTROABS 19.8*  HGB 8.6*  HCT 25.0*  MCV 88.3  PLT 161   Basic Metabolic Panel: Recent Labs  Lab 07/13/20 0825  NA 128*  K 5.1  CL 88*  CO2 26  GLUCOSE 187*  BUN 107*  CREATININE 6.58*  CALCIUM 9.0   GFR: Estimated Creatinine Clearance: 8.3 mL/min (A) (by C-G formula based on SCr of 6.58 mg/dL (H)). Liver Function Tests: Recent Labs  Lab 07/13/20 0825  AST 14*  ALT 12  ALKPHOS 90  BILITOT 0.8  PROT 7.2  ALBUMIN 2.2*   No results for input(s): LIPASE, AMYLASE in the last 168 hours. No results for input(s): AMMONIA in the last 168 hours. Coagulation Profile: No results for input(s): INR, PROTIME in the last 168 hours. Cardiac Enzymes: No results for input(s): CKTOTAL, CKMB, CKMBINDEX, TROPONINI in the last 168 hours. BNP (last 3 results) No results for input(s): PROBNP in the last 8760 hours. HbA1C: No results for input(s): HGBA1C in the last 72 hours. CBG: No results for input(s): GLUCAP in the last 168 hours. Lipid Profile: No results for input(s): CHOL, HDL, LDLCALC, TRIG, CHOLHDL, LDLDIRECT in the last 72 hours. Thyroid Function  Tests: No results for input(s): TSH, T4TOTAL, FREET4, T3FREE, THYROIDAB in the last 72 hours. Anemia Panel: No results for input(s): VITAMINB12, FOLATE, FERRITIN, TIBC, IRON, RETICCTPCT in the last 72 hours. Urine analysis:    Component Value Date/Time   COLORURINE YELLOW (A) 06/02/2020 1800   APPEARANCEUR HAZY (A) 06/02/2020 1800   LABSPEC 1.010 06/02/2020 1800   PHURINE 6.0 06/02/2020 1800   GLUCOSEU 50 (A) 06/02/2020 1800   HGBUR NEGATIVE 06/02/2020 1800   BILIRUBINUR NEGATIVE 06/02/2020 1800   KETONESUR NEGATIVE 06/02/2020 1800   PROTEINUR 100 (A) 06/02/2020 1800    NITRITE NEGATIVE 06/02/2020 1800   LEUKOCYTESUR NEGATIVE 06/02/2020 1800    Radiological Exams on Admission: US Renal  Result Date: 07/13/2020 CLINICAL DATA:  64 year old female with acute kidney injury EXAM: RENAL / URINARY TRACT ULTRASOUND COMPLETE COMPARISON:  None. FINDINGS: Right Kidney: Renal measurements: 10.6 x 4.7 x 4.6 cm = volume: 120 mL. The renal parenchyma is increased in echogenicity. There is also increased conspicuity between the corticomedullary interface. Circumscribed anechoic simple cyst with imperceptible walls are present. There are 2 measured at 1.9 x 1.8 x 1.9 cm and 1.7 x 1.3 x 1.4 cm. The larger cyst is present exophytic from the lower pole while the smaller cyst is partially exophytic from the interpolar region. No hydronephrosis or nephrolithiasis. Left Kidney: Renal measurements: 10.6 x 5.1 x 4.1 cm = volume: 117 mL. Increased parenchymal echogenicity. No hydronephrosis or nephrolithiasis. Tiny anechoic simple cyst measures 0.8 x 0.8 x 0.6 cm. Bladder: Appears normal for degree of bladder distention. Other: None. IMPRESSION: 1. No evidence of hydronephrosis. 2. Echogenic renal parenchyma bilaterally consistent with medical renal disease. 3. Simple renal cysts bilaterally. Electronically Signed   By: Jacqulynn Cadet M.D.   On: 07/13/2020 13:09   DG Chest Port 1 View  Result Date: 07/13/2020 CLINICAL DATA:  Questionable sepsis. EXAM: PORTABLE CHEST 1 VIEW COMPARISON:  06/05/2020 and older exams. FINDINGS: There is bilateral lung base opacity obscuring hemidiaphragms consistent with a combination of pleural effusions and atelectasis and/or infection. Upper lungs remain clear. No pneumothorax. Cardiac silhouette is normal in size.  No mediastinal masses. Right anterior chest wall Port-A-Cath is stable. Skeletal structures are grossly intact. IMPRESSION: 1. Bilateral pleural effusions with associated lung base opacity, similar to the prior chest radiograph. Lung base opacities  may be due to atelectasis, pneumonia or a combination. No convincing pulmonary edema. No pneumothorax. Electronically Signed   By: Lajean Manes M.D.   On: 07/13/2020 12:10    EKG: Independently reviewed.  Sinus rhythm  Assessment/Plan Principal Problem:   AKI (acute kidney injury) ([place]) Active Problems:   Malignant pleural effusion   Benign essential HTN   Anxiety and depression   Type 2 diabetes mellitus with diabetic chronic kidney disease (HCC)   Anemia of chronic disease   Metastatic breast cancer (HCC)   Antineoplastic chemotherapy induced anemia   Weakness generalized   Leukocytosis   Hyponatremia     Acute kidney injury Most likely due to ATN from hypotension due to overdiuresis and reduced oral fluid intake At baseline patient has stage IV chronic kidney disease with a serum creatinine of 2.4 and on admission today it is 6.5 with a normal serum bicarbonate level Hold Bumex and enalapril IV fluid hydration We will request nephrology consult    Metastatic breast cancer Patient with known stage IV breast cancer on palliative chemotherapy CT scan of the chest without contrast shows bilateral malignant pleural effusions, which are only slightly larger than  the comparison PET CT of 03/28/2020, with associated evidence of visceral/parietal pleural metastases. Re demonstration of skeletal metastases, with new/enlarging lytic lesions of the thoracic spine compatible with progression. The left breast mass appears to be enlarging compared to the prior PET. Patient is status post thoracentesis about 4 weeks ago with drainage of about 1 L of pleural fluid Repeat imaging shows recurrent bilateral pleural effusions but patient is asymptomatic at this time    Leukocytosis Concerning for an infectious process Patient has a white count of 22,000 with a left shift Still awaiting results of urine analysis Left foot wound appears clean and dry with no purulence and chest x-ray  does not show any evidence of pneumonia Patient was treated with IV vancomycin and cefepime in the ER, will hold off on further IV antibiotic administration until a source of infection is found Lactic acid is within normal limits    Diabetes mellitus with complications of stage IV chronic kidney disease Maintain consistent carbohydrate diet Sliding scale insulin for glycemic control     Antineoplastic induced anemia Secondary to chemotherapy which patient is receiving for metastatic breast cancer Hemoglobin is stable    Hypertension Hold all antihypertensive medication since patient is hypotensive and requires IV fluid hydration   Depression and anxiety Continue alprazolam and fluoxetine    History of diastolic dysfunction CHF Hold Bumex, atenolol and enalapril    Hyponatremia Probably related to SSRI use Patient is asymptomatic Monitor sodium levels closely    DVT prophylaxis: Heparin Code Status: DO NOT RESUSCITATE Family Communication: Greater than 50% of time was spent discussing plan of care with patient at the bedside.  All questions and concerns have been addressed.  CODE STATUS was discussed and she wishes to be placed on a do not resuscitate status.  She lists her son Randalyn Rhea as her healthcare power of attorney Disposition Plan: Back to previous home environment Consults called: Nephrology/oncology    Collier Bullock MD [facility]     07/13/2020, 3:17 PM

## 2020-07-13 NOTE — ED Notes (Signed)
External catheter placed on pt at this time. Tolerated well.

## 2020-07-13 NOTE — Progress Notes (Signed)
Elink following Code Sepsis. 

## 2020-07-14 ENCOUNTER — Encounter: Payer: Self-pay | Admitting: Internal Medicine

## 2020-07-14 ENCOUNTER — Other Ambulatory Visit: Payer: Self-pay

## 2020-07-14 DIAGNOSIS — L8990 Pressure ulcer of unspecified site, unspecified stage: Secondary | ICD-10-CM | POA: Insufficient documentation

## 2020-07-14 DIAGNOSIS — N179 Acute kidney failure, unspecified: Secondary | ICD-10-CM

## 2020-07-14 LAB — PROTIME-INR
INR: 1.3 — ABNORMAL HIGH (ref 0.8–1.2)
Prothrombin Time: 15.2 seconds (ref 11.4–15.2)

## 2020-07-14 LAB — CBC
HCT: 23 % — ABNORMAL LOW (ref 36.0–46.0)
Hemoglobin: 7.7 g/dL — ABNORMAL LOW (ref 12.0–15.0)
MCH: 30 pg (ref 26.0–34.0)
MCHC: 33.5 g/dL (ref 30.0–36.0)
MCV: 89.5 fL (ref 80.0–100.0)
Platelets: 334 10*3/uL (ref 150–400)
RBC: 2.57 MIL/uL — ABNORMAL LOW (ref 3.87–5.11)
RDW: 15.9 % — ABNORMAL HIGH (ref 11.5–15.5)
WBC: 22 10*3/uL — ABNORMAL HIGH (ref 4.0–10.5)
nRBC: 0 % (ref 0.0–0.2)

## 2020-07-14 LAB — BASIC METABOLIC PANEL
Anion gap: 13 (ref 5–15)
Anion gap: 11 (ref 5–15)
BUN: 106 mg/dL — ABNORMAL HIGH (ref 8–23)
BUN: 102 mg/dL — ABNORMAL HIGH (ref 8–23)
CO2: 25 mmol/L (ref 22–32)
CO2: 25 mmol/L (ref 22–32)
Calcium: 8.4 mg/dL — ABNORMAL LOW (ref 8.9–10.3)
Calcium: 8.2 mg/dL — ABNORMAL LOW (ref 8.9–10.3)
Chloride: 93 mmol/L — ABNORMAL LOW (ref 98–111)
Chloride: 93 mmol/L — ABNORMAL LOW (ref 98–111)
Creatinine, Ser: 6.07 mg/dL — ABNORMAL HIGH (ref 0.44–1.00)
Creatinine, Ser: 6.01 mg/dL — ABNORMAL HIGH (ref 0.44–1.00)
GFR, Estimated: 7 mL/min — ABNORMAL LOW (ref >60)
GFR, Estimated: 7 mL/min — ABNORMAL LOW (ref >60)
Glucose, Bld: 155 mg/dL — ABNORMAL HIGH (ref 70–99)
Glucose, Bld: 180 mg/dL — ABNORMAL HIGH (ref 70–99)
Potassium: 5 mmol/L (ref 3.5–5.1)
Potassium: 5 mmol/L (ref 3.5–5.1)
Sodium: 131 mmol/L — ABNORMAL LOW (ref 135–145)
Sodium: 129 mmol/L — ABNORMAL LOW (ref 135–145)

## 2020-07-14 LAB — URINE DRUG SCREEN, QUALITATIVE (ARMC ONLY)
Amphetamines, Ur Screen: NOT DETECTED
Barbiturates, Ur Screen: NOT DETECTED
Benzodiazepine, Ur Scrn: NOT DETECTED
Cannabinoid 50 Ng, Ur ~~LOC~~: NOT DETECTED
Cocaine Metabolite,Ur ~~LOC~~: NOT DETECTED
MDMA (Ecstasy)Ur Screen: NOT DETECTED
Methadone Scn, Ur: NOT DETECTED
Opiate, Ur Screen: NOT DETECTED
Phencyclidine (PCP) Ur S: NOT DETECTED
Tricyclic, Ur Screen: NOT DETECTED

## 2020-07-14 LAB — URINALYSIS, COMPLETE (UACMP) WITH MICROSCOPIC
Bilirubin Urine: NEGATIVE
Glucose, UA: 50 mg/dL — AB
Hgb urine dipstick: NEGATIVE
Ketones, ur: NEGATIVE mg/dL
Leukocytes,Ua: NEGATIVE
Nitrite: NEGATIVE
Protein, ur: 30 mg/dL — AB
Specific Gravity, Urine: 1.012 (ref 1.005–1.030)
pH: 5 (ref 5.0–8.0)

## 2020-07-14 LAB — CBG MONITORING, ED
Glucose-Capillary: 156 mg/dL — ABNORMAL HIGH (ref 70–99)
Glucose-Capillary: 182 mg/dL — ABNORMAL HIGH (ref 70–99)
Glucose-Capillary: 185 mg/dL — ABNORMAL HIGH (ref 70–99)
Glucose-Capillary: 256 mg/dL — ABNORMAL HIGH (ref 70–99)

## 2020-07-14 LAB — CANCER ANTIGEN 27.29: CA 27.29: 289.8 U/mL — ABNORMAL HIGH (ref 0.0–38.6)

## 2020-07-14 LAB — HEMOGLOBIN A1C
Hgb A1c MFr Bld: 7.7 % — ABNORMAL HIGH (ref 4.8–5.6)
Mean Plasma Glucose: 174.29 mg/dL

## 2020-07-14 LAB — APTT: aPTT: 33 seconds (ref 24–36)

## 2020-07-14 LAB — GLUCOSE, CAPILLARY
Glucose-Capillary: 237 mg/dL — ABNORMAL HIGH (ref 70–99)
Glucose-Capillary: 150 mg/dL — ABNORMAL HIGH (ref 70–99)

## 2020-07-14 MED ORDER — CODEINE SULFATE 30 MG PO TABS
30.0000 mg | ORAL_TABLET | Freq: Two times a day (BID) | ORAL | Status: DC
Start: 2020-07-14 — End: 2020-07-20
  Administered 2020-07-15 – 2020-07-20 (×5): 30 mg via ORAL
  Filled 2020-07-14 (×6): qty 1

## 2020-07-14 MED ORDER — SODIUM CHLORIDE 0.9 % IV SOLN: INTRAVENOUS | Status: DC

## 2020-07-14 MED ORDER — INSULIN DETEMIR 100 UNIT/ML ~~LOC~~ SOLN
25.0000 [IU] | Freq: Every day | SUBCUTANEOUS | Status: DC
  Administered 2020-07-14: 11:00:00 25 [IU] via SUBCUTANEOUS
  Filled 2020-07-14 (×2): qty 0.25

## 2020-07-14 MED ORDER — ACETAMINOPHEN-CODEINE #3 300-30 MG PO TABS
1.0000 | ORAL_TABLET | Freq: Two times a day (BID) | ORAL | Status: DC
  Administered 2020-07-15 – 2020-07-20 (×5): 1 via ORAL
  Filled 2020-07-14 (×6): qty 1

## 2020-07-14 NOTE — ED Notes (Signed)
Hospitalist notified of no urine output with bladder scan of 429 ml.

## 2020-07-14 NOTE — ED Notes (Signed)
RN noted that pt has not urinated. Pt states that she has not had a lot to drink today, but is going to try and urinate. Pt on pure wick.

## 2020-07-14 NOTE — Consult Note (Signed)
[place] CONSULT NOTE  Patient Care Team: [name], [name], MD as Medical Oncologist (Medical Oncology) Corey Skains, MD as Consulting Physician (Cardiology)  CHIEF COMPLAINTS/PURPOSE OF CONSULTATION: Metastatic breast cancer  HISTORY OF PRESENTING ILLNESS:  [name] 64 y.o.  female with multiple medical problems including stage IV breast cancer on chemotherapy, stage IV kidney disease, diabetes poorly controlled, history of peripheral vascular disease/osteomyelitis s/p left fifth toe recent amputation.  Patient was also recently had worsening shortness of breath/pleural effusion s/p thoracentesis.  Patient pleural effusion was thought secondary to be from underlying breast cancer/pleural-based metastasis.  With regards to metastatic stage IV breast cancer ER/PR positive, patient is status post multiple lines of therapies; most recently on weekly Doxil.  On the day of admission patient was seen in the clinic noted to be hypotensive systolic blood pressure of 80; afebrile; leukocytosis of 20,000; elevated creatinine of 6.5 [baseline around 2-2.5].  Patient received IV fluids in the clinic and then transferred to the emergency room for further evaluation treatment.  Since being in the emergency room patient has received IV fluids; started on infectious work-up; also received IV antibiotics.   This morning patient feels "okay".  Obviously feels tired.  Review of Systems  Constitutional: Positive for chills, malaise/fatigue and weight loss. Negative for diaphoresis and fever.  HENT: Negative for nosebleeds and sore throat.   Eyes: Negative for double vision.  Respiratory: Positive for shortness of breath. Negative for cough, hemoptysis, sputum production and wheezing.   Cardiovascular: Negative for chest pain, palpitations, orthopnea and leg swelling.  Gastrointestinal: Positive for nausea. Negative for  abdominal pain, blood in stool, constipation, diarrhea, heartburn, melena and vomiting.  Genitourinary: Negative for dysuria, frequency and urgency.  Musculoskeletal: Positive for back pain and joint pain.  Skin: Negative.  Negative for itching and rash.  Neurological: Positive for dizziness. Negative for tingling, focal weakness, weakness and headaches.  Endo/Heme/Allergies: Does not bruise/bleed easily.  Psychiatric/Behavioral: Negative for depression. The patient is not nervous/anxious and does not have insomnia.      MEDICAL HISTORY:  Past Medical History:  Diagnosis Date  . Anemia   . Anxiety   . Asthma   . Cancer ([place]) 03/10/2018   Per NM PET order. Carcinoma of upper-inner quadrant of left breast in female, estrogen receptor positive .  Marland Kitchen Cancer (HCC)    LUNG  . CHF (congestive heart failure) ([place]) 1997  . CKD (chronic kidney disease)   . Depression   . Diabetes mellitus, type 2 ([place])   . Family history of breast cancer   . Family history of colon cancer   . Family history of ovarian cancer   . Family history of pancreatic cancer   . Family history of prostate cancer   . Family history of stomach cancer   . GERD (gastroesophageal reflux disease)    history of an ulcer  . Hair loss   . History of left breast cancer 05/29/14  . History of partial hysterectomy 12/31/2016   Per patient.  Has not had a period in years.  Had a partial hysterectomy years ago.  Marland Kitchen Hypertension   . Mitral valve regurgitation   . Neuromuscular disorder (HCC)    neuropathies in hand  . Obesity   . Pancreatitis 1997  . Stroke [facility]) 2010   with mild left arm weakness    SURGICAL HISTORY: Past Surgical History:  Procedure Laterality Date  . AMPUTATION Left  03/30/2020   Procedure: AMPUTATION 5th RAY;  Surgeon: Samara Deist, DPM;  Location: [facility] ORS;  Service: Podiatry;  Laterality: Left;  . APPLICATION OF WOUND VAC Left 03/30/2020   Procedure: APPLICATION OF WOUND VAC;  Surgeon: Samara Deist, DPM;  Location: [facility] ORS;  Service: Podiatry;  Laterality: Left;  . CATARACT EXTRACTION W/PHACO Right 02/24/2019   Procedure: CATARACT EXTRACTION PHACO AND INTRAOCULAR LENS PLACEMENT ([place]) RIGHT DIABETES;  Surgeon: Marchia Meiers, MD;  Location: [facility] ORS;  Service: Ophthalmology;  Laterality: Right;  [place] 01:13.0 CDE 7.96 Fluid Pack Lot # [id number]  . CATARACT EXTRACTION W/PHACO Left 03/24/2019   Procedure: CATARACT EXTRACTION PHACO AND INTRAOCULAR LENS PLACEMENT ([facility]) - left diabetic;  Surgeon: Marchia Meiers, MD;  Location: [facility] ORS;  Service: Ophthalmology;  Laterality: Left;  [place]  01:36 CDE 13.93 Fluid pack lot # [id number]  . CENTRAL LINE INSERTION-TUNNELED N/A 04/04/2020   Procedure: CENTRAL LINE INSERTION-TUNNELED;  Surgeon: Katha Cabal, MD;  Location: [place] CV LAB;  Service: Cardiovascular;  Laterality: N/A;  . CESAREAN SECTION    . CHOLECYSTECTOMY    . DIALYSIS/PERMA CATHETER REMOVAL N/A 05/01/2020   Procedure: DIALYSIS/PERMA CATHETER REMOVAL;  Surgeon: Katha Cabal, MD;  Location: [place] CV LAB;  Service: Cardiovascular;  Laterality: N/A;  . EXCISION OF TONGUE LESION N/A 08/17/2018   Procedure: EXCISION OF TONGUE LESION WITH FROZEN SECTIONS;  Surgeon: Beverly Gust, MD;  Location: [facility] ORS;  Service: ENT;  Laterality: N/A;  . EYE SURGERY Right    cataract extraction  . IRRIGATION AND DEBRIDEMENT FOOT Left 03/30/2020   Procedure: IRRIGATION AND DEBRIDEMENT FOOT;  Surgeon: Samara Deist, DPM;  Location: [facility] ORS;  Service: Podiatry;  Laterality: Left;  . PARTIAL HYSTERECTOMY  12/31/2016   Per patient, she has not had a period in years since she had a partial hysterectomy.  Marland Kitchen PORTA CATH INSERTION    . TUBAL LIGATION      SOCIAL HISTORY: Social History   Socioeconomic History  . Marital status: Single    Spouse name: S.O.. .... keith  . Number of children: Not on file  . Years of education: Not on file  . Highest education level: Not on file   Occupational History    Comment: disabled  Tobacco Use  . Smoking status: Former Smoker    Packs/day: 0.50    Years: 1.00    Pack years: 0.50    Types: Cigarettes  . Smokeless tobacco: Never Used  Vaping Use  . Vaping Use: Never used  Substance and Sexual Activity  . Alcohol use: No    Alcohol/week: 0.0 standard drinks  . Drug use: No  . Sexual activity: Not on file    Comment: quit 8 years ago  Other Topics Concern  . Not on file  Social History Narrative  . Not on file   Social Determinants of Health   Financial Resource Strain: Not on file  Food Insecurity: Not on file  Transportation Needs: Not on file  Physical Activity: Not on file  Stress: Not on file  Social Connections: Not on file  Intimate Partner Violence: Not on file    FAMILY HISTORY: Family History  Problem Relation Age of Onset  . Ovarian cancer Mother 67  . Diabetes Mother   . Hypertension Mother   . COPD Father   . Hypertension Father   . Colon cancer Father 38  . Diabetes Sister   . Breast cancer Sister 83       bilateral  .  Diabetes Brother   . Leukemia Maternal Aunt   . Pancreatic cancer Paternal Aunt 30  . Pancreatic cancer Paternal Uncle   . Colon cancer Paternal Uncle   . Stomach cancer Maternal Grandfather 33  . Throat cancer Paternal Grandmother   . Breast cancer Maternal Aunt 80  . Colon cancer Maternal Aunt   . Bone cancer Maternal Aunt   . Breast cancer Paternal Aunt        dx >50  . Prostate cancer Paternal Uncle   . Pancreatic cancer Paternal Uncle   . Throat cancer Paternal Uncle   . Lung cancer Paternal Uncle   . Stomach cancer Paternal Uncle   . Brain cancer Paternal Aunt   . Cancer Cousin        liver, kidney  . Prostate cancer Cousin        meastatic  . Lung cancer Other     ALLERGIES:  is allergic to fish-derived products, sulfamethoxazole-trimethoprim, and chlorhexidine.  MEDICATIONS:  Current Facility-Administered Medications  Medication Dose Route  Frequency Provider Last Rate Last Admin  . 0.9 %  sodium chloride infusion   Intravenous Continuous Gwynne Edinger, MD 100 mL/hr at 07/14/20 1425 New Bag at 07/14/20 1425  . acetaminophen-codeine (TYLENOL #4) 300-60 MG per tablet 1 tablet  1 tablet Oral BID Agbata, Tochukwu, MD      . albuterol (VENTOLIN HFA) 108 (90 Base) MCG/ACT inhaler 2 puff  2 puff Inhalation Q6H PRN Agbata, Tochukwu, MD      . ALPRAZolam Duanne Moron) tablet 0.5 mg  0.5 mg Oral BID PRN Agbata, Tochukwu, MD      . aspirin EC tablet 81 mg  81 mg Oral Daily Agbata, Tochukwu, MD   81 mg at 07/14/20 1052  . docusate sodium (COLACE) capsule 100 mg  100 mg Oral Daily Agbata, Tochukwu, MD   100 mg at 07/14/20 1052  . famotidine (PEPCID) tablet 20 mg  20 mg Oral Daily Agbata, Tochukwu, MD   20 mg at 07/14/20 1052  . ferrous sulfate tablet 325 mg  325 mg Oral Q breakfast Agbata, Tochukwu, MD   325 mg at 07/14/20 1052  . FLUoxetine (PROZAC) capsule 20 mg  20 mg Oral BID Agbata, Tochukwu, MD   20 mg at 07/14/20 1052  . gabapentin (NEURONTIN) capsule 100 mg  100 mg Oral QHS Agbata, Tochukwu, MD   100 mg at 07/13/20 2123  . heparin injection 5,000 Units  5,000 Units Subcutaneous Q8H Agbata, Tochukwu, MD   5,000 Units at 07/14/20 1426  . insulin detemir (LEVEMIR) injection 25 Units  25 Units Subcutaneous Daily Gwynne Edinger, MD   25 Units at 07/14/20 1052  . ondansetron (ZOFRAN) tablet 4 mg  4 mg Oral Q6H PRN Agbata, Tochukwu, MD       Or  . ondansetron (ZOFRAN) injection 4 mg  4 mg Intravenous Q6H PRN Agbata, Tochukwu, MD      . senna (SENOKOT) tablet 8.6 mg  1 tablet Oral BID PRN Agbata, Tochukwu, MD      . simvastatin (ZOCOR) tablet 20 mg  20 mg Oral QPM Agbata, Tochukwu, MD   20 mg at 07/14/20 1611   Facility-Administered Medications Ordered in Other Encounters  Medication Dose Route Frequency Provider Last Rate Last Admin  . sodium chloride flush (NS) 0.9 % injection 10 mL  10 mL Intravenous PRN Cammie Sickle, MD   10 mL  at 01/30/16 1054      .  PHYSICAL EXAMINATION:  Vitals:  07/14/20 1223 07/14/20 1338  BP: 125/70 120/68  Pulse: 74 74  Resp: 20 20  Temp: 98 F (36.7 C) 97.9 F (36.6 C)  SpO2: 100% 98%   Filed Weights   07/14/20 1338  Weight: 179 lb 12.8 oz (81.6 kg)    Physical Exam Constitutional:      Comments: Patient resting in the bed comfortably.  She is alone.  Appears frail.  HENT:     Head: Normocephalic and atraumatic.     Mouth/Throat:     Mouth: Oropharynx is clear and moist.     Pharynx: No oropharyngeal exudate.  Eyes:     Pupils: Pupils are equal, round, and reactive to light.  Cardiovascular:     Rate and Rhythm: Normal rate and regular rhythm.  Pulmonary:     Effort: No respiratory distress.     Breath sounds: No wheezing.     Comments: Decreased breath sounds right more than left. Abdominal:     General: Bowel sounds are normal. There is no distension.     Palpations: Abdomen is soft. There is no mass.     Tenderness: There is no abdominal tenderness. There is no guarding or rebound.  Musculoskeletal:        General: No tenderness or edema. Normal range of motion.     Cervical back: Normal range of motion and neck supple.  Skin:    General: Skin is warm.     Comments: Chronic venous stasis changes noted bilateral lower extremities.  Pressure ulcer noted in the left heel; also ulcers noted on the right lower foot bandaged.  Neurological:     Mental Status: She is alert and oriented to person, place, and time.  Psychiatric:        Mood and Affect: Affect normal.      LABORATORY DATA:  I have reviewed the data as listed Lab Results  Component Value Date   WBC 22.0 (H) 07/14/2020   HGB 7.7 (L) 07/14/2020   HCT 23.0 (L) 07/14/2020   MCV 89.5 07/14/2020   PLT 334 07/14/2020   Recent Labs    04/23/20 0932 04/30/20 0912 05/07/20 0807 05/21/20 0841 06/22/20 0757 07/02/20 0759 07/13/20 0825 07/13/20 2335 07/14/20 0434  NA 136 133* 136   < > 137  136 128* 131* 129*  K 4.6 4.3 4.0   < > 4.2 4.2 5.1 5.0 5.0  CL 104 101 102   < > 96* 94* 88* 93* 93*  CO2 25 25 26    < > 32 31 26 25 25   GLUCOSE 248* 255* 116*   < > 97 208* 187* 155* 180*  BUN 26* 28* 40*   < > 31* 46* 107* 106* 102*  CREATININE 1.88* 2.25* 2.42*   < > 2.23* 2.44* 6.58* 6.07* 6.01*  CALCIUM 8.5* 8.5* 8.9   < > 9.1 9.0 9.0 8.4* 8.2*  GFRNONAA 28* 22* 21*   < > 24* 22* 7* 7* 7*  GFRAA 32* 26* 24*  --   --   --   --   --   --   PROT 7.4 7.0  --    < > 7.5 7.8 7.2  --   --   ALBUMIN 3.2* 3.3*  --    < > 2.6* 2.6* 2.2*  --   --   AST 17 16  --    < > 16 12* 14*  --   --   ALT 12 14  --    < >  9 9 12   --   --   ALKPHOS 65 69  --    < > 78 65 90  --   --   BILITOT 0.5 0.6  --    < > 0.5 0.5 0.8  --   --    < > = values in this interval not displayed.    RADIOGRAPHIC STUDIES: I have personally reviewed the radiological images as listed and agreed with the findings in the report. US Renal  Result Date: 07/13/2020 CLINICAL DATA:  64 year old female with acute kidney injury EXAM: RENAL / URINARY TRACT ULTRASOUND COMPLETE COMPARISON:  None. FINDINGS: Right Kidney: Renal measurements: 10.6 x 4.7 x 4.6 cm = volume: 120 mL. The renal parenchyma is increased in echogenicity. There is also increased conspicuity between the corticomedullary interface. Circumscribed anechoic simple cyst with imperceptible walls are present. There are 2 measured at 1.9 x 1.8 x 1.9 cm and 1.7 x 1.3 x 1.4 cm. The larger cyst is present exophytic from the lower pole while the smaller cyst is partially exophytic from the interpolar region. No hydronephrosis or nephrolithiasis. Left Kidney: Renal measurements: 10.6 x 5.1 x 4.1 cm = volume: 117 mL. Increased parenchymal echogenicity. No hydronephrosis or nephrolithiasis. Tiny anechoic simple cyst measures 0.8 x 0.8 x 0.6 cm. Bladder: Appears normal for degree of bladder distention. Other: None. IMPRESSION: 1. No evidence of hydronephrosis. 2. Echogenic renal  parenchyma bilaterally consistent with medical renal disease. 3. Simple renal cysts bilaterally. Electronically Signed   By: Jacqulynn Cadet M.D.   On: 07/13/2020 13:09   DG Chest Port 1 View  Result Date: 07/13/2020 CLINICAL DATA:  Questionable sepsis. EXAM: PORTABLE CHEST 1 VIEW COMPARISON:  06/05/2020 and older exams. FINDINGS: There is bilateral lung base opacity obscuring hemidiaphragms consistent with a combination of pleural effusions and atelectasis and/or infection. Upper lungs remain clear. No pneumothorax. Cardiac silhouette is normal in size.  No mediastinal masses. Right anterior chest wall Port-A-Cath is stable. Skeletal structures are grossly intact. IMPRESSION: 1. Bilateral pleural effusions with associated lung base opacity, similar to the prior chest radiograph. Lung base opacities may be due to atelectasis, pneumonia or a combination. No convincing pulmonary edema. No pneumothorax. Electronically Signed   By: Lajean Manes M.D.   On: 07/13/2020 12:10    Carcinoma of upper-inner quadrant of left breast in female, estrogen receptor positive ([place]) 64 year old female patient with a history of metastatic/stage IV breast cancer multiple other medical problems including chronic kidney disease stage IV, poorly controlled diabetes; diabetic foot ulcer-is currently admitted to hospital for hypotension/leukocytosis and acute renal failure.  # Stage IV breast cancer ER/PR positive HER-2 negative -October 30 CT scan-progression of disease-pleural metastases/bilateral effusion; worsening lytic lesions in the bones;  Currently on weekly DOXIL #3 so far.  Too early to assess response.  Weekly #4 treatment held because of acute issues see below  #Leukocytosis/hypotension systolic 77O blood pressure/acute on chronic renal failure-prerenal/overdiuresis versus others.  Defer to primary team; nephrology for management.-  # Anemia- sec to chemo-CKD- IV-in general hemoglobin around 8-9.  Today 7.7.   Recommend PRBC transfusion tomorrow if still less than 8.   #DNR/DNI-again reviewed the appropriateness of the CODE STATUS given her multiple medical problems and incurable disease.  I discussed overall poor prognosis especially context of multiple medical problems; declining performance status; recurrent hospitalizations.  Patient reluctant to discuss the big picture of the prognosis at this time.  States "we will take 1 day at a time".  Unfortunately will  have to keep bringing up the discussion repeatedly.  Will consult palliative care on 12/13.   Thank you Dr. Johnna Acosta for allowing me to participate in the care of your pleasant patient. Please do not hesitate to contact me with questions or concerns in the interim.  I tried to reach patient's son Lanny Hurst to discuss the above medical history/hospitalization; unable to reach today.  We will try to reach in the morning tomorrow.   All questions were answered. The patient knows to call the clinic with any problems, questions or concerns.    Cammie Sickle, MD 07/14/2020 7:10 PM

## 2020-07-14 NOTE — Evaluation (Signed)
Occupational Therapy Evaluation Patient Details Name: [name] MRN: [id number] DOB: 1956/06/30 Today's Date: 07/14/2020    History of Present Illness [name] is a 64 y.o. female with medical history significant for stage IV breast cancer on chemotherapy, stage IV chronic kidney disease, depression, chronic respiratory failure, hypertension, GERD who was sent to the emergency room from the cancer center for evaluation of abnormal labs.   Clinical Impression   [name] was seen for OT evaluation this date. Prior to hospital admission, pt was MOD I for mobility and ADLs using w/c. Pt lives c friend available 24/7 in home c ramped entrance. Pt presents to acute OT demonstrating impaired ADL performance and functional mobility 2/2 functional strength/balance deficits and decreased activity tolerance. Pt deferred OOB mobility this date 2/2 concern for foot wounds and elevated stretcher height. MIN A for simulated LBD. MOD I seated UB grooming/dressing tasks. Pt required MIN A for bed mobility and lateral scoot t/f (c feet dangling). Pt would benefit from skilled OT to address noted impairments and functional limitations (see below for any additional details) in order to maximize safety and independence while minimizing falls risk and caregiver burden. Upon hospital discharge, recommend HHOT to maximize pt safety and return to functional independence during meaningful occupations of daily life.     Follow Up Recommendations  Home health OT;Supervision/Assistance - 24 hour    Equipment Recommendations  None recommended by OT    Recommendations for Other Services       Precautions / Restrictions Precautions Precautions: Fall Restrictions Weight Bearing Restrictions: Yes LLE Weight Bearing: Non weight bearing Other Position/Activity Restrictions: Pt reports still NWBing from from prior sx (03/31/20), no formal orders      Mobility Bed Mobility Overal bed mobility: Needs  Assistance Bed Mobility: Supine to Sit;Sit to Supine     Supine to sit: Min assist;HOB elevated Sit to supine: Min assist;HOB elevated        Transfers      General transfer comment: Pt defered 2/2 elevated stretcher height and concern for foot wounds    Balance Overall balance assessment: Needs assistance Sitting-balance support: No upper extremity supported;Feet unsupported Sitting balance-Leahy Scale: Good         ADL either performed or assessed with clinical judgement   ADL Overall ADL's : Needs assistance/impaired    General ADL Comments: Pt deferred OOB mobility this date 2/2 concern for foot wounds and elevated stretcher height. MIN A for simulated LBD. MOD I seated UB grooming/dressing tasks                  Pertinent Vitals/Pain Pain Assessment: No/denies pain     Hand Dominance Right   Extremity/Trunk Assessment Upper Extremity Assessment Upper Extremity Assessment: Generalized weakness   Lower Extremity Assessment Lower Extremity Assessment: Generalized weakness;LLE deficits/detail LLE Deficits / Details: Dressing c/d/i and pt reports maintaining NWBing       Communication Communication Communication: No difficulties   Cognition Arousal/Alertness: Awake/alert Behavior During Therapy: WFL for tasks assessed/performed Overall Cognitive Status: Within Functional Limits for tasks assessed     General Comments       Exercises Exercises: Other exercises Other Exercises Other Exercises: Pt educated re: OT role, DME recs, d/c recs, falls prevention, ECS Other Exercises: LBD, UBD, sup<>sit, lateral scoot t/f, sitting balance/tolerance   Shoulder Instructions      Home Living Family/patient expects to be discharged to:: Private residence Living Arrangements: Spouse/significant other Available Help at Discharge: Friend(s);Available 24 hours/day Type  of Home: House Home Access: [place]: One level     Bathroom  Shower/Tub: Teacher, early years/pre: Standard     Home Equipment: Environmental consultant - 2 wheels;Tub bench;Grab bars - tub/shower;Wheelchair - manual (bed rails)          Prior Functioning/Environment Level of Independence: Independent with assistive device(s)        Comments: Pt reports MOD I for dressing/bathing and w/c transfers        OT Problem List: Decreased strength;Decreased activity tolerance;Impaired balance (sitting and/or standing)      OT Treatment/Interventions: Self-care/ADL training;Therapeutic exercise;Energy conservation;DME and/or AE instruction;Therapeutic activities;Patient/family education;Balance training    OT Goals(Current goals can be found in the care plan section) Acute Rehab OT Goals Patient Stated Goal: to return home OT Goal Formulation: With patient Time For Goal Achievement: 07/28/20 Potential to Achieve Goals: Good ADL Goals Pt Will Perform Grooming: with modified independence;sitting Pt Will Perform Lower Body Dressing: with supervision;sitting/lateral leans Pt Will Transfer to Toilet: with min guard assist;stand pivot transfer;bedside commode (c LRAD PRN)  OT Frequency: Min 1X/week    AM-PAC OT "6 Clicks" Daily Activity     Outcome Measure Help from another person eating meals?: None Help from another person taking care of personal grooming?: None Help from another person toileting, which includes using toliet, bedpan, or urinal?: A Little Help from another person bathing (including washing, rinsing, drying)?: A Little Help from another person to put on and taking off regular upper body clothing?: A Little Help from another person to put on and taking off regular lower body clothing?: A Little 6 Click Score: 20   End of Session Nurse Communication: Mobility status  Activity Tolerance: Patient tolerated treatment well Patient left: in bed;with call bell/phone within reach  OT Visit Diagnosis: Other abnormalities of gait and mobility  (R26.89)                Time: 2549-8264 OT Time Calculation (min): 14 min Charges:  OT General Charges $OT Visit: 1 Visit OT Evaluation $OT Eval Low Complexity: 1 Low OT Treatments $Therapeutic Activity: 8-22 mins  Dessie Coma, M.S. OTR/L  07/14/20, 1:36 PM  ascom [phone]

## 2020-07-14 NOTE — Assessment & Plan Note (Addendum)
64 year old female patient with a history of metastatic/stage IV breast cancer multiple other medical problems including chronic kidney disease stage IV, poorly controlled diabetes; diabetic foot ulcer-is currently admitted to hospital for hypotension/leukocytosis and acute renal failure.  # Stage IV breast cancer ER/PR positive HER-2 negative - October 30 CT scan-progression of disease-pleural metastases/bilateral effusion; worsening lytic lesions in the bones;  Currently on weekly DOXIL #3 so far; clinically worsening discontinue chemotherapy.  #Acute on chronic kidney disease-creatinine today 5.5; stable [ from 6.5 on admission]; patient is  poor candidate for dialysis-no significant improvement in renal function.  Discussed with nephrology.  # Leukocytosis-close infection empiric antibiotics.  #DNR/DNI; discussed with the patient regarding discharging home on hospice.   Patient aware of the plan.  Patient agreement.

## 2020-07-14 NOTE — Progress Notes (Signed)
Attempted to place space boots on pt for heel protection- pt refused/ floated heels on pillows

## 2020-07-14 NOTE — ED Notes (Addendum)
Pt repositioned in bed.

## 2020-07-14 NOTE — Progress Notes (Signed)
PROGRESS NOTE    RAMISA [name]  PPJ:[id number] DOB: 09/22/1955 DOA: 07/13/2020 PCP: [name], MD  Outpatient Specialists: nephrology, oncology    Brief Narrative:   [name] is a 64 y.o. female with medical history significant forstage IV breast cancer on chemotherapy, stage IV chronic kidney disease, depression, chronic respiratory failure, hypertension, GERD who was sent to the emergency room from the cancer center for evaluation of abnormal labs.  Patient had gone to the cancer center for chemotherapy and labs drawn showed worsening of her renal function from baseline.  At baseline she has a serum creatinine of 2.4 but today on admission her serum creatinine was 6.5.  Patient states that she cut back on her fluid intake because she recently had thoracentesis done in the hospital with drainage of about a liter of fluid from both lungs.  She states that her mouth stays dry but denies having any urinary frequency or nocturia.  She denies feeling dizzy or lightheaded and had a blood pressure in the 80s at the cancer center for which she received 1 L of IV fluid. She has a cough productive of clear phlegm. She denies having any fever or chills, no abdominal pain, no chest pain, no nausea, no vomiting, no diaphoresis or palpitations.   Assessment & Plan:   Principal Problem:   AKI (acute kidney injury) ([place]) Active Problems:   Benign essential HTN   Anxiety and depression   Type 2 diabetes mellitus with diabetic chronic kidney disease ([place])   Anemia of chronic disease   Metastatic breast cancer ([facility])   Antineoplastic chemotherapy induced anemia   Weakness generalized   Malignant pleural effusion   Leukocytosis   Hyponatremia   # Acute kidney injury on ckd stage 4 Prerenal, possible ATN, likely 2/2 purposeful decreased PO (pt was hoping to avoid reaccumulation of pleural effusions) and diuretic use. At baseline patient has stage IV chronic kidney disease with a serum  creatinine of 2.4 and on admission it is 6.5 with a normal serum bicarbonate level and upper level of normal K. No symptoms of infection and w/u thus far negative. - continue to hold home bumex and home bp meds  - s/p 1.5 L bolus, now on ns @ 100 - trend kidney function - nephrology consulted  # Metastatic breast cancer # Malignant pleural effusions Patient with known stage IV breast cancer on palliative chemotherapy doxil weekly CT scan of the chest without contrast showsbilateral malignant pleural effusions, which are only slightly larger than the comparison PET CT of 03/28/2020, with associated evidence of visceral/parietal pleural metastases. Re demonstrationofskeletal metastases, with new/enlarging lytic lesions of the thoracic spine compatible with progression. The left breast mass appears to be enlarging compared to the prior PET. Patient is status post thoracentesis about 4 weeks ago with drainage of about 1 L of pleural fluid - oncology to see - holding on repeat thoracentesis as respiratory status appears to be at baseline  # Chronic hypoxic respiratory failure On 4 L at home, stable here. 2/2 metastatic breast cancer and malignant pleural effusions - cont O2  # Leukocytosis # Recent left foot osteomyelitis Here with hypotension (resolved) and AKI likely 2/2 dehydration. Recent admission for left foot osteo s/p partial ray amputation and IV abx. Has been followed by wound, reported to be healing. No signs of worsening infection today. CXR without obvious PNA and patient says breathing is stable. Lactate wnl. No fevers, no dysuria. No abd pain or n/v/f. Received vanc/cefepime 12/10.  -  hold on further abx for now - monitor urine/blood cultures - fluids as above - wound care consult  # Hyponatremia Here sodium corrects to 130, has hx of hyponytremia. Not acute. Malignancy contributes, today's hyponatremia also likely driven but AKI. 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 H 7.7 which is stable, no overt bleeding - monitor - cont home iron  # Hypertension Here hypotensive on arrive, this AM normotensive - holding home bumex, amlodipine, atenolol, clonidine, enalapril   # Depression and anxiety - Continue alprazolam and fluoxetine  # Chronic pain - home gabapentin  # Hx CVA - cont home aspirin, statin  # Constipation - cont home colace, senna  # History of diastolic dysfunction CHF Hold Bumex   DVT prophylaxis: heparin Code Status: DNR Family Communication: none @ bedside  Status is: Inpatient  Remains inpatient appropriate because:Inpatient level of care appropriate due to severity of illness   Dispo: The patient is from: Home              Anticipated d/c is to: TBD, will need pt/ot eval ordered              Anticipated d/c date is: 3-5 days              Patient currently is not medically stable to d/c.        Consultants:  Oncology, nephrology  Procedures: none  Antimicrobials:  Vanc/cefepime 12/10    Subjective: This morning feeling at her baseline. No new cough or sob. No chest pain. No abd pain or v/d. No dysuria or hematuria.   Objective: Vitals:   07/14/20 0400 07/14/20 0530 07/14/20 0630 07/14/20 0800  BP: 112/63 (!) 112/58 101/65 112/65  Pulse: 72 72 72 73  Resp: 13 14 15 20   Temp:      TempSrc:      SpO2: 99% 99% 98% 99%    Intake/Output Summary (Last 24 hours) at 07/14/2020 0913 Last data filed at 07/14/2020 0558 Gross per 24 hour  Intake --  Output 510 ml  Net -510 ml   There were no vitals filed for this visit.  Examination:  General exam: Appears calm and comfortable  Respiratory system: decreased breath sounds at  bases, scattered rhonchi Cardiovascular system: S1 & S2 heard, rrr, soft systolic murmur Gastrointestinal system: Abdomen is nondistended, soft and nontender. No organomegaly or masses felt. Normal bowel sounds heard. Central nervous system: Alert and oriented. No focal neurological deficits. Extremities: moving all 4 equally Skin: 1+ LE edema, ulcer left lateral foot no surrounding erythema Psychiatry: Judgement and insight appear normal. Mood & affect appropriate.   Lines: Right subclavian port  Data Reviewed: I have personally reviewed following labs and imaging studies  CBC: Recent Labs  Lab 07/13/20 0825 07/14/20 0434  WBC 22.3* 22.0*  NEUTROABS 19.8*  --   HGB 8.6* 7.7*  HCT 25.0* 23.0*  MCV 88.3 89.5  PLT 358 163   Basic Metabolic Panel: Recent Labs  Lab 07/13/20 0825 07/13/20 2335 07/14/20 0434  NA 128* 131* 129*  K 5.1 5.0 5.0  CL 88* 93* 93*  CO2 26 25 25   GLUCOSE 187* 155* 180*  BUN 107* 106* 102*  CREATININE 6.58* 6.07* 6.01*  CALCIUM 9.0 8.4* 8.2*  GFR: Estimated Creatinine Clearance: 9 mL/min (A) (by C-G formula based on SCr of 6.01 mg/dL (H)). Liver Function Tests: Recent Labs  Lab 07/13/20 0825  AST 14*  ALT 12  ALKPHOS 90  BILITOT 0.8  PROT 7.2  ALBUMIN 2.2*   No results for input(s): LIPASE, AMYLASE in the last 168 hours. No results for input(s): AMMONIA in the last 168 hours. Coagulation Profile: Recent Labs  Lab 07/14/20 0542  INR 1.3*   Cardiac Enzymes: No results for input(s): CKTOTAL, CKMB, CKMBINDEX, TROPONINI in the last 168 hours. BNP (last 3 results) No results for input(s): PROBNP in the last 8760 hours. HbA1C: Recent Labs    07/13/20 2209  HGBA1C 7.7*   CBG: Recent Labs  Lab 07/13/20 2121 07/13/20 2211 07/13/20 2342 07/14/20 0219 07/14/20 0432  GLUCAP 137* 152* 156* 156* 182*   Lipid Profile: No results for input(s): CHOL, HDL, LDLCALC, TRIG, CHOLHDL, LDLDIRECT in the last 72 hours. Thyroid Function  Tests: No results for input(s): TSH, T4TOTAL, FREET4, T3FREE, THYROIDAB in the last 72 hours. Anemia Panel: No results for input(s): VITAMINB12, FOLATE, FERRITIN, TIBC, IRON, RETICCTPCT in the last 72 hours. Urine analysis:    Component Value Date/Time   COLORURINE YELLOW (A) 07/14/2020 0545   APPEARANCEUR HAZY (A) 07/14/2020 0545   LABSPEC 1.012 07/14/2020 0545   PHURINE 5.0 07/14/2020 0545   GLUCOSEU 50 (A) 07/14/2020 0545   HGBUR NEGATIVE 07/14/2020 0545   [place] 07/14/2020 0545   [place] 07/14/2020 0545   PROTEINUR 30 (A) 07/14/2020 0545   NITRITE NEGATIVE 07/14/2020 0545   LEUKOCYTESUR NEGATIVE 07/14/2020 0545   Sepsis Labs: @LABRCNTIP (procalcitonin:4,lacticidven:4)  ) Recent Results (from the past 240 hour(s))  Resp Panel by RT-PCR (Flu A&B, Covid) Nasopharyngeal Swab     Status: None   Collection Time: 07/13/20 11:56 AM   Specimen: Nasopharyngeal Swab; Nasopharyngeal(NP) swabs in vial transport medium  Result Value Ref Range Status   SARS Coronavirus 2 by RT PCR NEGATIVE NEGATIVE Final    Comment: (NOTE) SARS-CoV-2 target nucleic acids are NOT DETECTED.  The SARS-CoV-2 RNA is generally detectable in upper respiratory specimens during the acute phase of infection. The lowest concentration of SARS-CoV-2 viral copies this assay can detect is 138 copies/mL. A negative result does not preclude SARS-Cov-2 infection and should not be used as the sole basis for treatment or other patient management decisions. A negative result may occur with  improper specimen collection/handling, submission of specimen other than nasopharyngeal swab, presence of viral mutation(s) within the areas targeted by this assay, and inadequate number of viral copies(<138 copies/mL). A negative result must be combined with clinical observations, patient history, and epidemiological information. The expected result is Negative.  Fact Sheet for Patients:   EntrepreneurPulse.com.au  Fact Sheet for Healthcare Providers:  IncredibleEmployment.be  This test is no t yet approved or cleared by the [place] FDA and  has been authorized for detection and/or diagnosis of SARS-CoV-2 by FDA under an Emergency Use Authorization (EUA). This EUA will remain  in effect (meaning this test can be used) for the duration of the COVID-19 declaration under Section 564(b)(1) of the Act, 21 U.S.C.section 360bbb-3(b)(1), unless the authorization is terminated  or revoked sooner.       Influenza A by PCR NEGATIVE NEGATIVE Final   Influenza B by PCR NEGATIVE NEGATIVE Final    Comment: (NOTE) The Xpert Xpress SARS-CoV-2/FLU/RSV plus assay is intended as an aid in the diagnosis of influenza from Nasopharyngeal swab specimens and should not  be used as a sole basis for treatment. Nasal washings and aspirates are unacceptable for Xpert Xpress SARS-CoV-2/FLU/RSV testing.  Fact Sheet for Patients: EntrepreneurPulse.com.au  Fact Sheet for Healthcare Providers: IncredibleEmployment.be  This test is not yet approved or cleared by the [place] FDA and has been authorized for detection and/or diagnosis of SARS-CoV-2 by FDA under an Emergency Use Authorization (EUA). This EUA will remain in effect (meaning this test can be used) for the duration of the COVID-19 declaration under Section 564(b)(1) of the Act, 21 U.S.C. section 360bbb-3(b)(1), unless the authorization is terminated or revoked.  Performed at [facility], [place] [address] [place], [place] [address]   Blood culture (routine x 2)     Status: None (Preliminary result)   Collection Time: 07/13/20 11:56 AM   Specimen: BLOOD  Result Value Ref Range Status   Specimen Description BLOOD PORTA CATH  Final   Special Requests   Final    BOTTLES DRAWN AEROBIC AND ANAEROBIC Blood Culture adequate volume   Culture    Final    NO GROWTH < 24 HOURS Performed at [facility], [address], [place], [place] [address]    Report Status PENDING  Incomplete  Blood culture (routine x 2)     Status: None (Preliminary result)   Collection Time: 07/13/20 11:56 AM   Specimen: BLOOD  Result Value Ref Range Status   Specimen Description BLOOD LEFT ANTECUBITAL  Final   Special Requests   Final    BOTTLES DRAWN AEROBIC AND ANAEROBIC Blood Culture adequate volume   Culture   Final    NO GROWTH < 24 HOURS Performed at [facility], [address] [place], [place] [address]    Report Status PENDING  Incomplete         Radiology Studies: US Renal  Result Date: 07/13/2020 CLINICAL DATA:  64 year old female with acute kidney injury EXAM: RENAL / URINARY TRACT ULTRASOUND COMPLETE COMPARISON:  None. FINDINGS: Right Kidney: Renal measurements: 10.6 x 4.7 x 4.6 cm = volume: 120 mL. The renal parenchyma is increased in echogenicity. There is also increased conspicuity between the corticomedullary interface. Circumscribed anechoic simple cyst with imperceptible walls are present. There are 2 measured at 1.9 x 1.8 x 1.9 cm and 1.7 x 1.3 x 1.4 cm. The larger cyst is present exophytic from the lower pole while the smaller cyst is partially exophytic from the interpolar region. No hydronephrosis or nephrolithiasis. Left Kidney: Renal measurements: 10.6 x 5.1 x 4.1 cm = volume: 117 mL. Increased parenchymal echogenicity. No hydronephrosis or nephrolithiasis. Tiny anechoic simple cyst measures 0.8 x 0.8 x 0.6 cm. Bladder: Appears normal for degree of bladder distention. Other: None. IMPRESSION: 1. No evidence of hydronephrosis. 2. Echogenic renal parenchyma bilaterally consistent with medical renal disease. 3. Simple renal cysts bilaterally. Electronically Signed   By: Jacqulynn Cadet M.D.   On: 07/13/2020 13:09   DG Chest Port 1 View  Result Date: 07/13/2020 CLINICAL DATA:  Questionable sepsis. EXAM:  PORTABLE CHEST 1 VIEW COMPARISON:  06/05/2020 and older exams. FINDINGS: There is bilateral lung base opacity obscuring hemidiaphragms consistent with a combination of pleural effusions and atelectasis and/or infection. Upper lungs remain clear. No pneumothorax. Cardiac silhouette is normal in size.  No mediastinal masses. Right anterior chest wall Port-A-Cath is stable. Skeletal structures are grossly intact. IMPRESSION: 1. Bilateral pleural effusions with associated lung base opacity, similar to the prior chest radiograph. Lung base opacities may be due to atelectasis, pneumonia or a combination.  No convincing pulmonary edema. No pneumothorax. Electronically Signed   By: Lajean Manes M.D.   On: 07/13/2020 12:10        Scheduled Meds: . acetaminophen-codeine  1 tablet Oral BID  . aspirin EC  81 mg Oral Daily  . calcitRIOL  0.25 mcg Oral Daily  . docusate sodium  100 mg Oral Daily  . famotidine  20 mg Oral Daily  . ferrous sulfate  325 mg Oral Q breakfast  . FLUoxetine  20 mg Oral BID  . gabapentin  100 mg Oral QHS  . heparin  5,000 Units Subcutaneous Q8H  . simvastatin  20 mg Oral QPM   Continuous Infusions: . dextrose 5 % and 0.9% NaCl 100 mL/hr at 07/14/20 0538     LOS: 1 day    Time spent: 30 min    Desma Maxim, MD [facility]   If 7PM-7AM, please contact night-coverage [email or web address] Password [email or web address] 07/14/2020, 9:13 AM

## 2020-07-14 NOTE — ED Notes (Signed)
Bladder scan of 429 noted. Pt still has not urinated at this time.

## 2020-07-14 NOTE — Progress Notes (Signed)
[facility]  ROUNDING NOTE   Subjective:   [name] was admitted to [facility] on 07/13/2020 for AKI (acute kidney injury) [facility]) [N17.9]  Patient was last seen in nephrology clinic on 06/21/20 where enalapril and bumetanide were continued.   Patient has not been drinking much due to her prescribed fluid restriction. However patient continued to taking her bumetanide. She states she has not been eating well either. Patient was found to have an elevated creatinine , azotemia and hyponatremia. She was admitted and started on normal saline infusion. Creatinine has not improved and nephrology consulted.   Objective:  Vital signs in last 24 hours:  Temp:  [98 F (36.7 C)] 98 F (36.7 C) (12/11 1223) Pulse Rate:  [67-95] 74 (12/11 1223) Resp:  [13-31] 20 (12/11 1223) BP: (92-130)/(53-74) 125/70 (12/11 1223) SpO2:  [95 %-100 %] 100 % (12/11 1223)  Weight change:  There were no vitals filed for this visit.  Intake/Output: I/O last 3 completed shifts: In: -  Out: 510 [Urine:510]   Intake/Output this shift:  No intake/output data recorded.  Physical Exam: General: NAD,   Head: Normocephalic, atraumatic. Dry oral mucosal membranes  Eyes: Anicteric, PERRL  Neck: Supple, trachea midline  Lungs:  Coarse breath sounds  Heart: Regular rate and rhythm  Abdomen:  Soft, nontender,   Extremities:  trace peripheral edema.  Neurologic: Nonfocal, moving all four extremities  Skin: No lesions  Access: none    Basic Metabolic Panel: Recent Labs  Lab 07/13/20 0825 07/13/20 2335 07/14/20 0434  NA 128* 131* 129*  K 5.1 5.0 5.0  CL 88* 93* 93*  CO2 26 25 25   GLUCOSE 187* 155* 180*  BUN 107* 106* 102*  CREATININE 6.58* 6.07* 6.01*  CALCIUM 9.0 8.4* 8.2*    Liver Function Tests: Recent Labs  Lab 07/13/20 0825  AST 14*  ALT 12  ALKPHOS 90  BILITOT 0.8  PROT 7.2  ALBUMIN 2.2*   No results for input(s): LIPASE, AMYLASE in the last 168 hours. No results for  input(s): AMMONIA in the last 168 hours.  CBC: Recent Labs  Lab 07/13/20 0825 07/14/20 0434  WBC 22.3* 22.0*  NEUTROABS 19.8*  --   HGB 8.6* 7.7*  HCT 25.0* 23.0*  MCV 88.3 89.5  PLT 358 334    Cardiac Enzymes: No results for input(s): CKTOTAL, CKMB, CKMBINDEX, TROPONINI in the last 168 hours.  BNP: Invalid input(s): POCBNP  CBG: Recent Labs  Lab 07/13/20 2342 07/14/20 0219 07/14/20 0432 07/14/20 0542 07/14/20 1231  GLUCAP 156* 156* 182* 185* 256*    Microbiology: Results for orders placed or performed during the hospital encounter of 07/13/20  Resp Panel by RT-PCR (Flu A&B, Covid) Nasopharyngeal Swab     Status: None   Collection Time: 07/13/20 11:56 AM   Specimen: Nasopharyngeal Swab; Nasopharyngeal(NP) swabs in vial transport medium  Result Value Ref Range Status   SARS Coronavirus 2 by RT PCR NEGATIVE NEGATIVE Final    Comment: (NOTE) SARS-CoV-2 target nucleic acids are NOT DETECTED.  The SARS-CoV-2 RNA is generally detectable in upper respiratory specimens during the acute phase of infection. The lowest concentration of SARS-CoV-2 viral copies this assay can detect is 138 copies/mL. A negative result does not preclude SARS-Cov-2 infection and should not be used as the sole basis for treatment or other patient management decisions. A negative result may occur with  improper specimen collection/handling, submission of specimen other than nasopharyngeal swab, presence of viral mutation(s) within the areas targeted by  this assay, and inadequate number of viral copies(<138 copies/mL). A negative result must be combined with clinical observations, patient history, and epidemiological information. The expected result is Negative.  Fact Sheet for Patients:  EntrepreneurPulse.com.au  Fact Sheet for Healthcare Providers:  IncredibleEmployment.be  This test is no t yet approved or cleared by the [place] FDA and  has been  authorized for detection and/or diagnosis of SARS-CoV-2 by FDA under an Emergency Use Authorization (EUA). This EUA will remain  in effect (meaning this test can be used) for the duration of the COVID-19 declaration under Section 564(b)(1) of the Act, 21 U.S.C.section 360bbb-3(b)(1), unless the authorization is terminated  or revoked sooner.       Influenza A by PCR NEGATIVE NEGATIVE Final   Influenza B by PCR NEGATIVE NEGATIVE Final    Comment: (NOTE) The Xpert Xpress SARS-CoV-2/FLU/RSV plus assay is intended as an aid in the diagnosis of influenza from Nasopharyngeal swab specimens and should not be used as a sole basis for treatment. Nasal washings and aspirates are unacceptable for Xpert Xpress SARS-CoV-2/FLU/RSV testing.  Fact Sheet for Patients: EntrepreneurPulse.com.au  Fact Sheet for Healthcare Providers: IncredibleEmployment.be  This test is not yet approved or cleared by the [place] FDA and has been authorized for detection and/or diagnosis of SARS-CoV-2 by FDA under an Emergency Use Authorization (EUA). This EUA will remain in effect (meaning this test can be used) for the duration of the COVID-19 declaration under Section 564(b)(1) of the Act, 21 U.S.C. section 360bbb-3(b)(1), unless the authorization is terminated or revoked.  Performed at [facility], [address] [place], [place] [address]   Blood culture (routine x 2)     Status: None (Preliminary result)   Collection Time: 07/13/20 11:56 AM   Specimen: BLOOD  Result Value Ref Range Status   Specimen Description BLOOD PORTA CATH  Final   Special Requests   Final    BOTTLES DRAWN AEROBIC AND ANAEROBIC Blood Culture adequate volume   Culture   Final    NO GROWTH < 24 HOURS Performed at [facility], [address] [place], [place] [address]    Report Status PENDING  Incomplete  Blood culture (routine x 2)     Status: None (Preliminary  result)   Collection Time: 07/13/20 11:56 AM   Specimen: BLOOD  Result Value Ref Range Status   Specimen Description BLOOD LEFT ANTECUBITAL  Final   Special Requests   Final    BOTTLES DRAWN AEROBIC AND ANAEROBIC Blood Culture adequate volume   Culture   Final    NO GROWTH < 24 HOURS Performed at [facility], [address], [place], [place] [address]    Report Status PENDING  Incomplete    Coagulation Studies: Recent Labs    07/14/20 0542  LABPROT 15.2  INR 1.3*    Urinalysis: Recent Labs    07/14/20 0545  COLORURINE YELLOW*  LABSPEC 1.012  PHURINE 5.0  GLUCOSEU 50*  HGBUR NEGATIVE  BILIRUBINUR NEGATIVE  KETONESUR NEGATIVE  PROTEINUR 30*  NITRITE NEGATIVE  LEUKOCYTESUR NEGATIVE      Imaging: US Renal  Result Date: 07/13/2020 CLINICAL DATA:  64 year old female with acute kidney injury EXAM: RENAL / URINARY TRACT ULTRASOUND COMPLETE COMPARISON:  None. FINDINGS: Right Kidney: Renal measurements: 10.6 x 4.7 x 4.6 cm = volume: 120 mL. The renal parenchyma is increased in echogenicity. There is also increased conspicuity between the corticomedullary interface. Circumscribed anechoic simple cyst with imperceptible walls are present. There are 2  measured at 1.9 x 1.8 x 1.9 cm and 1.7 x 1.3 x 1.4 cm. The larger cyst is present exophytic from the lower pole while the smaller cyst is partially exophytic from the interpolar region. No hydronephrosis or nephrolithiasis. Left Kidney: Renal measurements: 10.6 x 5.1 x 4.1 cm = volume: 117 mL. Increased parenchymal echogenicity. No hydronephrosis or nephrolithiasis. Tiny anechoic simple cyst measures 0.8 x 0.8 x 0.6 cm. Bladder: Appears normal for degree of bladder distention. Other: None. IMPRESSION: 1. No evidence of hydronephrosis. 2. Echogenic renal parenchyma bilaterally consistent with medical renal disease. 3. Simple renal cysts bilaterally. Electronically Signed   By: Jacqulynn Cadet M.D.   On: 07/13/2020 13:09   DG  Chest Port 1 View  Result Date: 07/13/2020 CLINICAL DATA:  Questionable sepsis. EXAM: PORTABLE CHEST 1 VIEW COMPARISON:  06/05/2020 and older exams. FINDINGS: There is bilateral lung base opacity obscuring hemidiaphragms consistent with a combination of pleural effusions and atelectasis and/or infection. Upper lungs remain clear. No pneumothorax. Cardiac silhouette is normal in size.  No mediastinal masses. Right anterior chest wall Port-A-Cath is stable. Skeletal structures are grossly intact. IMPRESSION: 1. Bilateral pleural effusions with associated lung base opacity, similar to the prior chest radiograph. Lung base opacities may be due to atelectasis, pneumonia or a combination. No convincing pulmonary edema. No pneumothorax. Electronically Signed   By: Lajean Manes M.D.   On: 07/13/2020 12:10     Medications:    sodium chloride      acetaminophen-codeine  1 tablet Oral BID   aspirin EC  81 mg Oral Daily   calcitRIOL  0.25 mcg Oral Daily   docusate sodium  100 mg Oral Daily   famotidine  20 mg Oral Daily   ferrous sulfate  325 mg Oral Q breakfast   FLUoxetine  20 mg Oral BID   gabapentin  100 mg Oral QHS   heparin  5,000 Units Subcutaneous Q8H   insulin detemir  25 Units Subcutaneous Daily   simvastatin  20 mg Oral QPM   albuterol, ALPRAZolam, ondansetron **OR** ondansetron (ZOFRAN) IV, senna  Assessment/ Plan:  Ms. RICKETTA COLANTONIO is a 64 y.o. black female with stage IV breast cancer, diabetes mellitus type II insulin dependent, asthma, CVA with left residual weakness, congestive heart failure, depression, hypertension, hyperlipidemia who was admitted to [facility] on 07/13/2020 for Cough [R05.9] Cancer ([place]) [C80.1] Chronic respiratory failure with hypoxia ([place]) [J96.11] AKI (acute kidney injury) ([place]) [N17.9] S/P amputation of lesser toe, left (HCC) [Z89.422] Leukocytosis, unspecified type [D72.829] Sepsis, due to unspecified organism, unspecified whether acute organ  dysfunction present ([place]) [A41.9]  1. Acute Kidney Injury on chronic kidney disease stage IV with proteinuria: baseline creatinine of  2.44, GFR of 22 on 07/02/2020.  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  - Continue IV fluids. Monitor volume status - Holding enalapril and bumetanide.  - No acute indication for dialysis. Discussed dialysis with the patient.   2. Hypertension: home regimen of amlodipine, atenolol, bumetanide, clonidine, enalapril.  Holding all agents at this time.   3. Anemia of chronic kidney disease: hemoglobin has dropped to 7.7. Normocytic. Iron studies were done on 04/06/20.  - Will discuss with oncology before starting EPO.   4. Secondary Hyperparathyroidism: PTH of 44 on 04/04/20.  - Recheck today - Discontinue calcitriol.    LOS: 1 [name] 12/11/20211:35 PM

## 2020-07-15 LAB — GLUCOSE, CAPILLARY
Glucose-Capillary: 55 mg/dL — ABNORMAL LOW (ref 70–99)
Glucose-Capillary: 58 mg/dL — ABNORMAL LOW (ref 70–99)
Glucose-Capillary: 69 mg/dL — ABNORMAL LOW (ref 70–99)
Glucose-Capillary: 81 mg/dL (ref 70–99)
Glucose-Capillary: 125 mg/dL — ABNORMAL HIGH (ref 70–99)
Glucose-Capillary: 155 mg/dL — ABNORMAL HIGH (ref 70–99)
Glucose-Capillary: 130 mg/dL — ABNORMAL HIGH (ref 70–99)

## 2020-07-15 LAB — URINE CULTURE: Culture: NO GROWTH

## 2020-07-15 LAB — CBC
HCT: 25.3 % — ABNORMAL LOW (ref 36.0–46.0)
Hemoglobin: 8.5 g/dL — ABNORMAL LOW (ref 12.0–15.0)
MCH: 30.1 pg (ref 26.0–34.0)
MCHC: 33.6 g/dL (ref 30.0–36.0)
MCV: 89.7 fL (ref 80.0–100.0)
Platelets: 385 10*3/uL (ref 150–400)
RBC: 2.82 MIL/uL — ABNORMAL LOW (ref 3.87–5.11)
RDW: 15.9 % — ABNORMAL HIGH (ref 11.5–15.5)
WBC: 22.2 10*3/uL — ABNORMAL HIGH (ref 4.0–10.5)
nRBC: 0 % (ref 0.0–0.2)

## 2020-07-15 LAB — BASIC METABOLIC PANEL
Anion gap: 12 (ref 5–15)
BUN: 97 mg/dL — ABNORMAL HIGH (ref 8–23)
CO2: 22 mmol/L (ref 22–32)
Calcium: 8.5 mg/dL — ABNORMAL LOW (ref 8.9–10.3)
Chloride: 98 mmol/L (ref 98–111)
Creatinine, Ser: 5.75 mg/dL — ABNORMAL HIGH (ref 0.44–1.00)
GFR, Estimated: 8 mL/min — ABNORMAL LOW (ref >60)
Glucose, Bld: 67 mg/dL — ABNORMAL LOW (ref 70–99)
Potassium: 4.6 mmol/L (ref 3.5–5.1)
Sodium: 132 mmol/L — ABNORMAL LOW (ref 135–145)

## 2020-07-15 MED ORDER — INSULIN ASPART 100 UNIT/ML ~~LOC~~ SOLN
0.0000 [IU] | Freq: Three times a day (TID) | SUBCUTANEOUS | Status: DC
  Administered 2020-07-15: 17:00:00 2 [IU] via SUBCUTANEOUS
  Filled 2020-07-15: qty 1

## 2020-07-15 MED ORDER — INSULIN ASPART 100 UNIT/ML ~~LOC~~ SOLN: 0.0000 [IU] | Freq: Every day | SUBCUTANEOUS | Status: DC

## 2020-07-15 NOTE — Consult Note (Addendum)
[facility] Nurse Consult Note: Patient receiving care at [facility] Remote consult after reviewing chart and [place] with bedside nurse Janett Billow).  Reason for Consult: Left foot wound, right heel wound Wound type: Unstageable right heel wound Open incision left foot from 5th toe amputation with minimal drainage. Pressure Injury POA: Yes Measurements: provided by bedside nurse in flowsheet.  Dressing procedure/placement/frequency: Paint entire right heel with betadine and allow to air dry. Perform once a day. Place both feet in Prevalon boots if patient will allow, or pillows under the legs to keep pressure off of the heels. Place Aquacel Advantage on the left foot incision, secure with Kerlex. Change daily.   Monitor the wound area(s) for worsening of condition such as: Signs/symptoms of infection, increase in size, development of or worsening of odor, development of pain, or increased pain at the affected locations.   Notify the medical team if any of these develop.  Thank you for the consult. [facility] nurse will not follow at this time.   Please re-consult the [facility] team if needed.  Cathlean Marseilles Tamala Julian, MSN, RN, [place], [place]le Pearl, [facility] Wound Treatment Associate Pager [phone]

## 2020-07-15 NOTE — Progress Notes (Signed)
PROGRESS NOTE    [name]  DGL:[id number] DOB: [birth year] DOA: 07/13/2020 PCP: [name], MD  Outpatient Specialists: nephrology, oncology    Brief Narrative:   [name] is a 64 y.o. female with medical history significant forstage IV breast cancer on chemotherapy, stage IV chronic kidney disease, depression, chronic respiratory failure, hypertension, GERD who was sent to the emergency room from the cancer center for evaluation of abnormal labs.  Patient had gone to the cancer center for chemotherapy and labs drawn showed worsening of her renal function from baseline.  At baseline she has a serum creatinine of 2.4 but today on admission her serum creatinine was 6.5.  Patient states that she cut back on her fluid intake because she recently had thoracentesis done in the hospital with drainage of about a liter of fluid from both lungs.  She states that her mouth stays dry but denies having any urinary frequency or nocturia.  She denies feeling dizzy or lightheaded and had a blood pressure in the 80s at the cancer center for which she received 1 L of IV fluid. She has a cough productive of clear phlegm. She denies having any fever or chills, no abdominal pain, no chest pain, no nausea, no vomiting, no diaphoresis or palpitations.   Assessment & Plan:   Principal Problem:   AKI (acute kidney injury) ([place]) Active Problems:   Benign essential HTN   Anxiety and depression   Type 2 diabetes mellitus with diabetic chronic kidney disease ([place])   Anemia of chronic disease   Metastatic breast cancer (HCC)   Antineoplastic chemotherapy induced anemia   Weakness generalized   Malignant pleural effusion   Leukocytosis   Hyponatremia   Pressure injury of skin   # Acute kidney injury on ckd stage 4 Prerenal, possible ATN, likely 2/2 purposeful decreased PO (pt was hoping to avoid reaccumulation of pleural effusions) and diuretic use. At baseline patient has stage IV chronic  kidney disease with a serum creatinine of 2.4 and on admission it is 6.5 with a normal serum bicarbonate level and upper level of normal K. No symptoms of infection and w/u thus far negative. Today Cr mildly improved to 5.75, k wnl. UOP not very good 450 yesterday. Renal u/s 12/10 w/o hydro, kidneys w/ signs medical renal disease. - continue to hold home bumex and home bp meds  - s/p 1.5 L bolus, now on ns @ 100, will continue that for now - trend kidney function - nephrology consulted, recs appreciated  # Metastatic breast cancer # Malignant pleural effusions Patient with known stage IV breast cancer on palliative chemotherapy doxil weekly CT scan of the chest without contrast showsbilateral malignant pleural effusions, which are only slightly larger than the comparison PET CT of 03/28/2020, with associated evidence of visceral/parietal pleural metastases. Re demonstrationofskeletal metastases, with new/enlarging lytic lesions of the thoracic spine compatible with progression. The left breast mass appears to be enlarging compared to the prior PET. Patient is status post thoracentesis about 4 weeks ago with drainage of about 1 L of pleural fluid - oncology following, recs appreciated. Overall prognosis is poor and treatment is palliative, planning on palliative consult Monday - holding on repeat thoracentesis as respiratory status appears to be at baseline  # Chronic hypoxic respiratory failure On 4 L at home, stable here. 2/2 metastatic breast cancer and malignant pleural effusions - cont O2  # Leukocytosis # Recent left foot osteomyelitis Here with hypotension (resolved) and AKI likely 2/2 dehydration. Recent  admission for left foot osteo s/p partial ray amputation and IV abx. Has been followed by wound, reported to be healing. No signs of worsening infection today. CXR without obvious PNA and patient says breathing is stable. Lactate wnl. No fevers, no dysuria. No abd pain or n/v/f.  Received vanc/cefepime 12/10.  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 H 8.5, no bleeding - monitor - cont home iron  # Hypertension Here hypotensive on arrive, now normotensive - holding home bumex, amlodipine, atenolol, clonidine, enalapril   # Depression and anxiety - Continue alprazolam and fluoxetine  # Chronic pain - home gabapentin  # Hx CVA - cont home aspirin, statin  # Constipation - cont home colace, senna  # History of diastolic dysfunction CHF - Hold Bumex  # Stage 2 sacral decubitus ulcer - POA - Wound care   DVT prophylaxis: heparin Code Status: DNR Family Communication: none @ bedside  Status is: Inpatient  Remains inpatient appropriate because:Inpatient level of care appropriate due to severity of illness   Dispo: The patient is from: Home              Anticipated d/c is to: TBD, pt/ot evals ordered              Anticipated d/c date is: 3-5 days              Patient currently is not medically stable to d/c.        Consultants:  Oncology, nephrology  Procedures: none  Antimicrobials:  Vanc/cefepime 12/10    Subjective: This morning feeling at her baseline. No new cough or sob. No chest pain. No abd pain or v/d. No dysuria or hematuria. Tolerating diet.    Objective: Vitals:   07/14/20 2156 07/15/20 0454 07/15/20 0747 07/15/20 1133  BP: 113/66 (!) 141/78 113/68 131/71  Pulse: 71 76 70 70  Resp: 18 18 18 19   Temp: 98.4 F (36.9 C) 97.9 F (36.6 C) 97.8 F (36.6 C) (!) 97.5 F (36.4 C)  TempSrc: Oral Oral Oral  Oral  SpO2: 100% 100% 100% 100%  Weight:  85.2 kg    Height:        Intake/Output Summary (Last 24 hours) at 07/15/2020 1136 Last data filed at 07/15/2020 0932 Gross per 24 hour  Intake 1355.88 ml  Output 525 ml  Net 830.88 ml   Filed Weights   07/14/20 1338 07/15/20 0454  Weight: 81.6 kg 85.2 kg    Examination:  General exam: Appears calm and comfortable  Respiratory system: decreased breath sounds at bases, scattered rhonchi Cardiovascular system: S1 & S2 heard, rrr, soft systolic murmur Gastrointestinal system: Abdomen is nondistended, soft and nontender. No organomegaly or masses felt. Normal bowel sounds heard. Central nervous system: Alert and oriented. No focal neurological deficits. Extremities: moving all 4 equally Skin: 1+ LE edema, ulcer left lateral foot no surrounding erythema. Small stage 2 several sacral decub ulcers Psychiatry: Judgement and insight appear normal. Mood & affect appropriate.   Lines: Right subclavian port  Data Reviewed: I have personally reviewed following labs and imaging studies  CBC: Recent Labs  Lab  07/13/20 0825 07/14/20 0434 07/15/20 0656  WBC 22.3* 22.0* 22.2*  NEUTROABS 19.8*  --   --   HGB 8.6* 7.7* 8.5*  HCT 25.0* 23.0* 25.3*  MCV 88.3 89.5 89.7  PLT 358 334 562   Basic Metabolic Panel: Recent Labs  Lab 07/13/20 0825 07/13/20 2335 07/14/20 0434 07/15/20 0656  NA 128* 131* 129* 132*  K 5.1 5.0 5.0 4.6  CL 88* 93* 93* 98  CO2 26 25 25 22   GLUCOSE 187* 155* 180* 67*  BUN 107* 106* 102* 97*  CREATININE 6.58* 6.07* 6.01* 5.75*  CALCIUM 9.0 8.4* 8.2* 8.5*   GFR: Estimated Creatinine Clearance: 10.1 mL/min (A) (by C-G formula based on SCr of 5.75 mg/dL (H)). Liver Function Tests: Recent Labs  Lab 07/13/20 0825  AST 14*  ALT 12  ALKPHOS 90  BILITOT 0.8  PROT 7.2  ALBUMIN 2.2*   No results for input(s): LIPASE, AMYLASE in the last 168 hours. No results for input(s): AMMONIA in the last 168  hours. Coagulation Profile: Recent Labs  Lab 07/14/20 0542  INR 1.3*   Cardiac Enzymes: No results for input(s): CKTOTAL, CKMB, CKMBINDEX, TROPONINI in the last 168 hours. BNP (last 3 results) No results for input(s): PROBNP in the last 8760 hours. HbA1C: Recent Labs    07/13/20 2209  HGBA1C 7.7*   CBG: Recent Labs  Lab 07/15/20 0746 07/15/20 0811 07/15/20 0835 07/15/20 0913 07/15/20 1131  GLUCAP 55* 58* 69* 81 125*   Lipid Profile: No results for input(s): CHOL, HDL, LDLCALC, TRIG, CHOLHDL, LDLDIRECT in the last 72 hours. Thyroid Function Tests: No results for input(s): TSH, T4TOTAL, FREET4, T3FREE, THYROIDAB in the last 72 hours. Anemia Panel: No results for input(s): VITAMINB12, FOLATE, FERRITIN, TIBC, IRON, RETICCTPCT in the last 72 hours. Urine analysis:    Component Value Date/Time   COLORURINE YELLOW (A) 07/14/2020 0545   APPEARANCEUR HAZY (A) 07/14/2020 0545   LABSPEC 1.012 07/14/2020 0545   PHURINE 5.0 07/14/2020 0545   GLUCOSEU 50 (A) 07/14/2020 0545   HGBUR NEGATIVE 07/14/2020 0545   [place] 07/14/2020 0545   [place] 07/14/2020 0545   PROTEINUR 30 (A) 07/14/2020 0545   NITRITE NEGATIVE 07/14/2020 0545   LEUKOCYTESUR NEGATIVE 07/14/2020 0545   Sepsis Labs: @LABRCNTIP (procalcitonin:4,lacticidven:4)  ) Recent Results (from the past 240 hour(s))  Resp Panel by RT-PCR (Flu A&B, Covid) Nasopharyngeal Swab     Status: None   Collection Time: 07/13/20 11:56 AM   Specimen: Nasopharyngeal Swab; Nasopharyngeal(NP) swabs in vial transport medium  Result Value Ref Range Status   SARS Coronavirus 2 by RT PCR NEGATIVE NEGATIVE Final    Comment: (NOTE) SARS-CoV-2 target nucleic acids are NOT DETECTED.  The SARS-CoV-2 RNA is generally detectable in upper respiratory specimens during the acute phase of infection. The lowest concentration of SARS-CoV-2 viral copies this assay can detect is 138 copies/mL. A negative result does not  preclude SARS-Cov-2 infection and should not be used as the sole basis for treatment or other patient management decisions. A negative result may occur with  improper specimen collection/handling, submission of specimen other than nasopharyngeal swab, presence of viral mutation(s) within the areas targeted by this assay, and inadequate number of viral copies(<138 copies/mL). A negative result must be combined with clinical observations, patient history, and epidemiological information. The expected result is Negative.  Fact Sheet for Patients:  EntrepreneurPulse.com.au  Fact Sheet for Healthcare Providers:  IncredibleEmployment.be  This test is no t yet approved or cleared by the [place] FDA  and  has been authorized for detection and/or diagnosis of SARS-CoV-2 by FDA under an Emergency Use Authorization (EUA). This EUA will remain  in effect (meaning this test can be used) for the duration of the COVID-19 declaration under Section 564(b)(1) of the Act, 21 U.S.C.section 360bbb-3(b)(1), unless the authorization is terminated  or revoked sooner.       Influenza A by PCR NEGATIVE NEGATIVE Final   Influenza B by PCR NEGATIVE NEGATIVE Final    Comment: (NOTE) The Xpert Xpress SARS-CoV-2/FLU/RSV plus assay is intended as an aid in the diagnosis of influenza from Nasopharyngeal swab specimens and should not be used as a sole basis for treatment. Nasal washings and aspirates are unacceptable for Xpert Xpress SARS-CoV-2/FLU/RSV testing.  Fact Sheet for Patients: EntrepreneurPulse.com.au  Fact Sheet for Healthcare Providers: IncredibleEmployment.be  This test is not yet approved or cleared by the [place] FDA and has been authorized for detection and/or diagnosis of SARS-CoV-2 by FDA under an Emergency Use Authorization (EUA). This EUA will remain in effect (meaning this test can be used) for the  duration of the COVID-19 declaration under Section 564(b)(1) of the Act, 21 U.S.C. section 360bbb-3(b)(1), unless the authorization is terminated or revoked.  Performed at [facility], [address] [place], [place] [address]   Blood culture (routine x 2)     Status: None (Preliminary result)   Collection Time: 07/13/20 11:56 AM   Specimen: BLOOD  Result Value Ref Range Status   Specimen Description BLOOD PORTA CATH  Final   Special Requests   Final    BOTTLES DRAWN AEROBIC AND ANAEROBIC Blood Culture adequate volume   Culture   Final    NO GROWTH 2 DAYS Performed at [facility], [address] [place], [place] [address]    Report Status PENDING  Incomplete  Blood culture (routine x 2)     Status: None (Preliminary result)   Collection Time: 07/13/20 11:56 AM   Specimen: BLOOD  Result Value Ref Range Status   Specimen Description BLOOD LEFT ANTECUBITAL  Final   Special Requests   Final    BOTTLES DRAWN AEROBIC AND ANAEROBIC Blood Culture adequate volume   Culture   Final    NO GROWTH 2 DAYS Performed at [facility], [address], [place], [place] [address]    Report Status PENDING  Incomplete  Urine Culture     Status: None   Collection Time: 07/14/20  5:45 AM   Specimen: Urine, Random  Result Value Ref Range Status   Specimen Description   Final    URINE, RANDOM Performed at [facility], [address], [place], [place] [address]    Special Requests   Final    NONE Performed at [facility], [address], [place], [place] [address]    Culture   Final    NO GROWTH Performed at [facility], [place] [address] [place], [place] [address]    Report Status 07/15/2020 FINAL  Final         Radiology Studies: US Renal  Result Date: 07/13/2020 CLINICAL DATA:  64 year old female with acute kidney injury EXAM: RENAL / URINARY TRACT ULTRASOUND COMPLETE COMPARISON:  None. FINDINGS: Right Kidney: Renal  measurements: 10.6 x 4.7 x 4.6 cm = volume: 120 mL. The renal parenchyma is increased in echogenicity. There is also increased conspicuity between the corticomedullary interface. Circumscribed anechoic simple cyst with imperceptible walls are present. There are 2 measured at 1.9 x 1.8  x 1.9 cm and 1.7 x 1.3 x 1.4 cm. The larger cyst is present exophytic from the lower pole while the smaller cyst is partially exophytic from the interpolar region. No hydronephrosis or nephrolithiasis. Left Kidney: Renal measurements: 10.6 x 5.1 x 4.1 cm = volume: 117 mL. Increased parenchymal echogenicity. No hydronephrosis or nephrolithiasis. Tiny anechoic simple cyst measures 0.8 x 0.8 x 0.6 cm. Bladder: Appears normal for degree of bladder distention. Other: None. IMPRESSION: 1. No evidence of hydronephrosis. 2. Echogenic renal parenchyma bilaterally consistent with medical renal disease. 3. Simple renal cysts bilaterally. Electronically Signed   By: Jacqulynn Cadet M.D.   On: 07/13/2020 13:09   DG Chest Port 1 View  Result Date: 07/13/2020 CLINICAL DATA:  Questionable sepsis. EXAM: PORTABLE CHEST 1 VIEW COMPARISON:  06/05/2020 and older exams. FINDINGS: There is bilateral lung base opacity obscuring hemidiaphragms consistent with a combination of pleural effusions and atelectasis and/or infection. Upper lungs remain clear. No pneumothorax. Cardiac silhouette is normal in size.  No mediastinal masses. Right anterior chest wall Port-A-Cath is stable. Skeletal structures are grossly intact. IMPRESSION: 1. Bilateral pleural effusions with associated lung base opacity, similar to the prior chest radiograph. Lung base opacities may be due to atelectasis, pneumonia or a combination. No convincing pulmonary edema. No pneumothorax. Electronically Signed   By: Lajean Manes M.D.   On: 07/13/2020 12:10        Scheduled Meds: . acetaminophen-codeine  1 tablet Oral BID   And  . codeine  30 mg Oral BID  . aspirin EC  81 mg  Oral Daily  . docusate sodium  100 mg Oral Daily  . famotidine  20 mg Oral Daily  . ferrous sulfate  325 mg Oral Q breakfast  . FLUoxetine  20 mg Oral BID  . gabapentin  100 mg Oral QHS  . heparin  5,000 Units Subcutaneous Q8H  . insulin detemir  25 Units Subcutaneous Daily  . simvastatin  20 mg Oral QPM   Continuous Infusions: . sodium chloride 100 mL/hr at 07/15/20 0910     LOS: 2 days    Time spent: 30 min    Desma Maxim, MD [facility]   If 7PM-7AM, please contact night-coverage [email or web address] Password [facility] 07/15/2020, 11:36 AM

## 2020-07-15 NOTE — TOC Initial Note (Signed)
Transition of Care [facility]) - Initial/Assessment Note    Patient Details  Name: [name] MRN: [id number] Date of Birth: April 11, 1956  Transition of Care [facility] CM/SW Contact:    Meriel Flavors, LCSW Phone Number: 07/15/2020, 2:44 PM  Clinical Narrative:                 Readmission prevention screen complete. CSW met with patient. CSW introduced role and explained that discharge planning would be discussed. PCP is Dr. Ginette Pitman. No transportation issues. Pharmacy is [facility] in [place]. No issues obtaining medications. Patient is agreeable to HH/PT and has been active with [facility], Has oxygen at home provided by [facility]        Patient Goals and CMS Choice        Expected Discharge Plan and Services                                                Prior Living Arrangements/Services                       Activities of Daily Living Home Assistive Devices/Equipment: Cytogeneticist (specify type) ADL Screening (condition at time of admission) Patient's cognitive ability adequate to safely complete daily activities?: Yes Is the patient deaf or have difficulty hearing?: No Does the patient have difficulty seeing, even when wearing glasses/contacts?: No Does the patient have difficulty concentrating, remembering, or making decisions?: No Patient able to express need for assistance with ADLs?: Yes Does the patient have difficulty dressing or bathing?: No Independently performs ADLs?: Yes (appropriate for developmental age) Does the patient have difficulty walking or climbing stairs?: Yes Weakness of Legs: None Weakness of Arms/Hands: None  Permission Sought/Granted                  Emotional Assessment              Admission diagnosis:  Cough [R05.9] Cancer ([place]) [C80.1] Chronic respiratory failure with hypoxia ([place]) [J96.11] AKI (acute kidney injury) ([place]) [N17.9] S/P amputation of lesser toe, left (HCC) [Z89.422] Leukocytosis, unspecified  type [D72.829] Sepsis, due to unspecified organism, unspecified whether acute organ dysfunction present [facility]) [A41.9] Patient Active Problem List   Diagnosis Date Noted  . Pressure injury of skin 07/14/2020  . Hyponatremia 07/13/2020  . Palliative care encounter   . Acute on chronic diastolic CHF (congestive heart failure) ([place]) 06/02/2020  . 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 Leukocytosis 06/02/2020  . Osteomyelitis ([place]) 04/05/2020  . Diabetic foot ulcer ([place]) 04/05/2020  . CKD (chronic kidney disease), stage III ([place]) 04/05/2020  . Sepsis ([place]) 03/29/2020  . Antineoplastic chemotherapy induced anemia 03/02/2020  . Weakness generalized 03/02/2020  . Fall at home 03/02/2020  . Tear of meniscus of knee 11/17/2019  . Lactic acidosis   . AKI (acute kidney injury) ([place]) 09/27/2019  . Metastatic breast cancer ([place]) 09/27/2019  . Hypertension complicating diabetes ([place]) 09/27/2019  . Hyperlipidemia associated with type 2 diabetes mellitus ([place]) 09/27/2019  . Hyperkalemia 09/27/2019  . Taking a statin medication 05/18/2019  . Anemia of chronic disease 04/27/2019  . Benign hypertensive kidney disease with chronic kidney disease 04/27/2019  . Hyposmolality and/or hyponatremia 04/27/2019  . Proteinuria 04/27/2019  . Secondary hyperparathyroidism of renal origin ([place]) 04/27/2019  . Goals of care, counseling/discussion 10/01/2018  . Major depressive disorder, recurrent, in remission ([place]) 09/13/2018  .  Metastasis from malignant tumor of breast ([place]) 09/13/2018  . Genetic testing 07/02/2018  . Family history of breast cancer   . Family history of ovarian cancer   . Family history of pancreatic cancer   . Family history of colon cancer   . Family history of stomach cancer   . Family history of prostate cancer   . Carcinoma of upper-inner quadrant of left breast in female, estrogen receptor positive ([place]) 05/23/2016  . Type 2 diabetes mellitus with diabetic chronic kidney disease  ([place]) 08/27/2015  . Cerebrovascular accident (CVA) ([place]) 08/27/2015  . Benign essential HTN 12/15/2014  . Chronic systolic CHF (congestive heart failure) ([place]) 06/21/2014  . Combined fat and carbohydrate induced hyperlipemia 06/21/2014  . MI (mitral incompetence) 06/21/2014  . Asthma 06/09/2014  . Chronic kidney disease (CKD), stage V ([place]) 06/09/2014  . Anxiety and depression 06/09/2014   PCP:  [name], MD Pharmacy:   [facility] [phone] - [address], [place] AT [facility] OF [address] [place] [address] Phone: [phone] Fax: [phone]  [facility] [place], [place] - [address] [place] [address] Phone: [phone] Fax: [phone]  [place] [place] [address] [place] [address] Phone: [phone] Fax: [phone]     Social Determinants of Health ([place]) Interventions    Readmission Risk Interventions Readmission Risk Prevention Plan 07/15/2020 06/04/2020 04/01/2020  Transportation Screening Complete Complete Complete  Medication Review (RN Care Manager) Complete Complete Complete  PCP or Specialist appointment within 3-5 days of discharge Complete Complete Complete  HRI or Home Care Consult Complete Complete Complete  SW Recovery Care/Counseling Consult Complete Complete Complete  Palliative Care Screening Not Applicable Not Applicable Not [place] Not Applicable Not Applicable Not Applicable  Some recent data might be hidden

## 2020-07-15 NOTE — Progress Notes (Signed)
[name]   DOB:[birth year]   [id number]    Subjective: No acute events overnight.  Patient however feels her blood sugars have been running low this morning.  Otherwise denies any headaches.  No fevers.  No nausea no vomiting.  Objective:  Vitals:   07/15/20 1133 07/15/20 1611  BP: 131/71 128/72  Pulse: 70 71  Resp: 19 19  Temp: (!) 97.5 F (36.4 C) 98.2 F (36.8 C)  SpO2: 100% 99%     Intake/Output Summary (Last 24 hours) at 07/15/2020 1748 Last data filed at 07/15/2020 0932 Gross per 24 hour  Intake 1355.88 ml  Output 525 ml  Net 830.88 ml    Physical Exam Constitutional:      Comments: Sick appearing African-American female patient.  No acute distress.  Resting in the bed comfortably.  Patient is frail.  HENT:     Head: Normocephalic and atraumatic.     Mouth/Throat:     Mouth: Oropharynx is clear and moist.     Pharynx: No oropharyngeal exudate.  Eyes:     Pupils: Pupils are equal, round, and reactive to light.  Cardiovascular:     Rate and Rhythm: Normal rate and regular rhythm.  Pulmonary:     Effort: No respiratory distress.     Breath sounds: No wheezing.     Comments: Decreased breath sounds bilaterally right more than left. Abdominal:     General: Bowel sounds are normal. There is no distension.     Palpations: Abdomen is soft. There is no mass.     Tenderness: There is no abdominal tenderness. There is no guarding or rebound.  Musculoskeletal:        General: No tenderness or edema. Normal range of motion.     Cervical back: Normal range of motion and neck supple.  Skin:    General: Skin is warm.     Comments: Bilateral extremities left more than right wounds noted.  Neurological:     Mental Status: She is alert and oriented to person, place, and time.  Psychiatric:        Mood and Affect: Affect normal.      Labs:  Lab Results  Component Value Date   WBC 22.2 (H) 07/15/2020   HGB 8.5 (L) 07/15/2020   HCT 25.3 (L) 07/15/2020   MCV 89.7  07/15/2020   PLT 385 07/15/2020   NEUTROABS 19.8 (H) 07/13/2020    Lab Results  Component Value Date   NA 132 (L) 07/15/2020   K 4.6 07/15/2020   CL 98 07/15/2020   CO2 22 07/15/2020    Studies:  No results found.  Carcinoma of upper-inner quadrant of left breast in female, estrogen receptor positive ([place]) 64 year old female patient with a history of metastatic/stage IV breast cancer multiple other medical problems including chronic kidney disease stage IV, poorly controlled diabetes; diabetic foot ulcer-is currently admitted to hospital for hypotension/leukocytosis and acute renal failure.  # Stage IV breast cancer ER/PR positive HER-2 negative -October 30 CT scan-progression of disease-pleural metastases/bilateral effusion; worsening lytic lesions in the bones;  Currently on weekly DOXIL #3 so far.  Too early to assess response.  Continue to hold chemotherapy given the acute issues.  #Leukocytosis/hypotension systolic 64P blood pressure/acute on chronic renal failure-prerenal/overdiuresis versus others.  Clinically stable.  However continues to have leukocytosis around 20,000.  No clear source of infection.  Defer to primary team.  Acute renal failure-creatinine still around 6; no significant improvement.  Discussed with Dr. Juleen China; patient a  poor candidate for hemodialysis/especially context of her metastatic breast cancer.  # Anemia- sec to chemo-CKD- IV-in general hemoglobin around 8-9.  Today 8.5; STABLE.   #DNR/DNI/patient understands continued poor prognosis.  However reluctant with further discussion regarding prognosis/treatment plan.  Patient states "will take 1 day at a time."  I called the patient son Lanny Hurst and I had a very long discussion with him regarding extremely poor prognosis progressive incurable malignancy/the context of multiple co-morbidities.  He understands that evaluation to a point where the benefits of chemotherapy is outweighed by the potential side effects/and  risk of death from chemotherapy.  He agrees that patient has been trying to avoid discussion with regards to her health for quite some time.  However he verbalizes that patient's health has been declining over quite some time.  Discussed that would recommend palliative care evaluation tomorrow.  Cammie Sickle, MD 07/15/2020  5:48 PM

## 2020-07-15 NOTE — Progress Notes (Signed)
[facility]  ROUNDING NOTE   Subjective:   UOP 400 recorded Patient more awake and alert this morning. She states she is starting to feel better.   Creatinine 5.75 (6.01)  NS at 175mL/hr  Objective:  Vital signs in last 24 hours:  Temp:  [97.5 F (36.4 C)-98.4 F (36.9 C)] 97.5 F (36.4 C) (12/12 1133) Pulse Rate:  [70-76] 70 (12/12 1133) Resp:  [18-20] 19 (12/12 1133) BP: (113-141)/(66-78) 131/71 (12/12 1133) SpO2:  [98 %-100 %] 100 % (12/12 1133) Weight:  [81.6 kg-85.2 kg] 85.2 kg (12/12 0454)  Weight change:  Filed Weights   07/14/20 1338 07/15/20 0454  Weight: 81.6 kg 85.2 kg    Intake/Output: I/O last 3 completed shifts: In: 1355.9 [I.V.:1355.9] Out: 910 [Urine:910]   Intake/Output this shift:  Total I/O In: -  Out: 125 [Urine:125]  Physical Exam: General: NAD, laying in bed  Head: Normocephalic, atraumatic. Dry oral mucosal membranes  Eyes: Anicteric, PERRL  Neck: Supple, trachea midline  Lungs:  Coarse breath sounds  Heart: Regular rate and rhythm  Abdomen:  Soft, nontender,   Extremities:  + peripheral edema.  Neurologic: Nonfocal, moving all four extremities  Skin: No lesions  Access: none    Basic Metabolic Panel: Recent Labs  Lab 07/13/20 0825 07/13/20 2335 07/14/20 0434 07/15/20 0656  NA 128* 131* 129* 132*  K 5.1 5.0 5.0 4.6  CL 88* 93* 93* 98  CO2 26 25 25 22   GLUCOSE 187* 155* 180* 67*  BUN 107* 106* 102* 97*  CREATININE 6.58* 6.07* 6.01* 5.75*  CALCIUM 9.0 8.4* 8.2* 8.5*    Liver Function Tests: Recent Labs  Lab 07/13/20 0825  AST 14*  ALT 12  ALKPHOS 90  BILITOT 0.8  PROT 7.2  ALBUMIN 2.2*   No results for input(s): LIPASE, AMYLASE in the last 168 hours. No results for input(s): AMMONIA in the last 168 hours.  CBC: Recent Labs  Lab 07/13/20 0825 07/14/20 0434 07/15/20 0656  WBC 22.3* 22.0* 22.2*  NEUTROABS 19.8*  --   --   HGB 8.6* 7.7* 8.5*  HCT 25.0* 23.0* 25.3*  MCV 88.3 89.5 89.7  PLT  358 334 385    Cardiac Enzymes: No results for input(s): CKTOTAL, CKMB, CKMBINDEX, TROPONINI in the last 168 hours.  BNP: Invalid input(s): POCBNP  CBG: Recent Labs  Lab 07/15/20 0746 07/15/20 0811 07/15/20 0835 07/15/20 0913 07/15/20 1131  GLUCAP 55* 58* 69* 81 125*    Microbiology: Results for orders placed or performed during the hospital encounter of 07/13/20  Resp Panel by RT-PCR (Flu A&B, Covid) Nasopharyngeal Swab     Status: None   Collection Time: 07/13/20 11:56 AM   Specimen: Nasopharyngeal Swab; Nasopharyngeal(NP) swabs in vial transport medium  Result Value Ref Range Status   SARS Coronavirus 2 by RT PCR NEGATIVE NEGATIVE Final    Comment: (NOTE) SARS-CoV-2 target nucleic acids are NOT DETECTED.  The SARS-CoV-2 RNA is generally detectable in upper respiratory specimens during the acute phase of infection. The lowest concentration of SARS-CoV-2 viral copies this assay can detect is 138 copies/mL. A negative result does not preclude SARS-Cov-2 infection and should not be used as the sole basis for treatment or other patient management decisions. A negative result may occur with  improper specimen collection/handling, submission of specimen other than nasopharyngeal swab, presence of viral mutation(s) within the areas targeted by this assay, and inadequate number of viral copies(<138 copies/mL). A negative result must be combined with clinical observations, patient  history, and epidemiological information. The expected result is Negative.  Fact Sheet for Patients:  EntrepreneurPulse.com.au  Fact Sheet for Healthcare Providers:  IncredibleEmployment.be  This test is no t yet approved or cleared by the [place] FDA and  has been authorized for detection and/or diagnosis of SARS-CoV-2 by FDA under an Emergency Use Authorization (EUA). This EUA will remain  in effect (meaning this test can be used) for the duration of  the COVID-19 declaration under Section 564(b)(1) of the Act, 21 U.S.C.section 360bbb-3(b)(1), unless the authorization is terminated  or revoked sooner.       Influenza A by PCR NEGATIVE NEGATIVE Final   Influenza B by PCR NEGATIVE NEGATIVE Final    Comment: (NOTE) The Xpert Xpress SARS-CoV-2/FLU/RSV plus assay is intended as an aid in the diagnosis of influenza from Nasopharyngeal swab specimens and should not be used as a sole basis for treatment. Nasal washings and aspirates are unacceptable for Xpert Xpress SARS-CoV-2/FLU/RSV testing.  Fact Sheet for Patients: EntrepreneurPulse.com.au  Fact Sheet for Healthcare Providers: IncredibleEmployment.be  This test is not yet approved or cleared by the [place] FDA and has been authorized for detection and/or diagnosis of SARS-CoV-2 by FDA under an Emergency Use Authorization (EUA). This EUA will remain in effect (meaning this test can be used) for the duration of the COVID-19 declaration under Section 564(b)(1) of the Act, 21 U.S.C. section 360bbb-3(b)(1), unless the authorization is terminated or revoked.  Performed at [facility], [address] [place], [place] [address]   Blood culture (routine x 2)     Status: None (Preliminary result)   Collection Time: 07/13/20 11:56 AM   Specimen: BLOOD  Result Value Ref Range Status   Specimen Description BLOOD PORTA CATH  Final   Special Requests   Final    BOTTLES DRAWN AEROBIC AND ANAEROBIC Blood Culture adequate volume   Culture   Final    NO GROWTH 2 DAYS Performed at [facility], [address], [place], [place] [address]    Report Status PENDING  Incomplete  Blood culture (routine x 2)     Status: None (Preliminary result)   Collection Time: 07/13/20 11:56 AM   Specimen: BLOOD  Result Value Ref Range Status   Specimen Description BLOOD LEFT ANTECUBITAL  Final   Special Requests   Final    BOTTLES DRAWN  AEROBIC AND ANAEROBIC Blood Culture adequate volume   Culture   Final    NO GROWTH 2 DAYS Performed at [facility], [address], [place], [place] [address]    Report Status PENDING  Incomplete  Urine Culture     Status: None   Collection Time: 07/14/20  5:45 AM   Specimen: Urine, Random  Result Value Ref Range Status   Specimen Description   Final    URINE, RANDOM Performed at [facility], [address] [place], [place] [address]    Special Requests   Final    NONE Performed at [facility], [address], [place], [place] [address]    Culture   Final    NO GROWTH Performed at [facility], [place] [address] [place],  [address]    Report Status 07/15/2020 FINAL  Final    Coagulation Studies: Recent Labs    07/14/20 0542  LABPROT 15.2  INR 1.3*    Urinalysis: Recent Labs    07/14/20 0545  COLORURINE YELLOW*  LABSPEC 1.012  PHURINE 5.0  GLUCOSEU 50*  HGBUR NEGATIVE  BILIRUBINUR NEGATIVE  KETONESUR NEGATIVE  PROTEINUR 30*  NITRITE NEGATIVE  LEUKOCYTESUR NEGATIVE      Imaging: US Renal  Result Date: 07/13/2020 CLINICAL DATA:  64 year old female with acute kidney injury EXAM: RENAL / URINARY TRACT ULTRASOUND COMPLETE COMPARISON:  None. FINDINGS: Right Kidney: Renal measurements: 10.6 x 4.7 x 4.6 cm = volume: 120 mL. The renal parenchyma is increased in echogenicity. There is also increased conspicuity between the corticomedullary interface. Circumscribed anechoic simple cyst with imperceptible walls are present. There are 2 measured at 1.9 x 1.8 x 1.9 cm and 1.7 x 1.3 x 1.4 cm. The larger cyst is present exophytic from the lower pole while the smaller cyst is partially exophytic from the interpolar region. No hydronephrosis or nephrolithiasis. Left Kidney: Renal measurements: 10.6 x 5.1 x 4.1 cm = volume: 117 mL. Increased parenchymal echogenicity. No hydronephrosis or nephrolithiasis. Tiny anechoic simple cyst measures  0.8 x 0.8 x 0.6 cm. Bladder: Appears normal for degree of bladder distention. Other: None. IMPRESSION: 1. No evidence of hydronephrosis. 2. Echogenic renal parenchyma bilaterally consistent with medical renal disease. 3. Simple renal cysts bilaterally. Electronically Signed   By: Jacqulynn Cadet M.D.   On: 07/13/2020 13:09   DG Chest Port 1 View  Result Date: 07/13/2020 CLINICAL DATA:  Questionable sepsis. EXAM: PORTABLE CHEST 1 VIEW COMPARISON:  06/05/2020 and older exams. FINDINGS: There is bilateral lung base opacity obscuring hemidiaphragms consistent with a combination of pleural effusions and atelectasis and/or infection. Upper lungs remain clear. No pneumothorax. Cardiac silhouette is normal in size.  No mediastinal masses. Right anterior chest wall Port-A-Cath is stable. Skeletal structures are grossly intact. IMPRESSION: 1. Bilateral pleural effusions with associated lung base opacity, similar to the prior chest radiograph. Lung base opacities may be due to atelectasis, pneumonia or a combination. No convincing pulmonary edema. No pneumothorax. Electronically Signed   By: Lajean Manes M.D.   On: 07/13/2020 12:10     Medications:   . sodium chloride 100 mL/hr at 07/15/20 0910   . acetaminophen-codeine  1 tablet Oral BID   And  . codeine  30 mg Oral BID  . aspirin EC  81 mg Oral Daily  . docusate sodium  100 mg Oral Daily  . famotidine  20 mg Oral Daily  . ferrous sulfate  325 mg Oral Q breakfast  . FLUoxetine  20 mg Oral BID  . gabapentin  100 mg Oral QHS  . heparin  5,000 Units Subcutaneous Q8H  . insulin aspart  0-5 Units Subcutaneous QHS  . insulin aspart  0-9 Units Subcutaneous TID WC  . simvastatin  20 mg Oral QPM   albuterol, ALPRAZolam, ondansetron **OR** ondansetron (ZOFRAN) IV, senna  Assessment/ Plan:  [name] is a 64 y.o. black female with stage IV breast cancer, diabetes mellitus type II insulin dependent, asthma, CVA with left residual weakness,  congestive heart failure, depression, hypertension, hyperlipidemia who was admitted to [facility] on 07/13/2020 for Cough [R05.9] Cancer ([place]) [C80.1] Chronic respiratory failure with hypoxia ([place]) [J96.11] AKI (acute kidney injury) ([place]) [N17.9] S/P amputation of lesser toe, left ([facility]) [Z89.422] Leukocytosis, unspecified type [D72.829] Sepsis, due to unspecified organism, unspecified whether acute organ dysfunction present ([place]) [A41.9]  1. Acute Kidney Injury on chronic kidney disease stage IV with proteinuria: baseline creatinine of  2.44, GFR of 22 on 07/02/2020.  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  - Continue  IV fluids. Monitor volume status - Holding enalapril and bumetanide.  - No acute indication for dialysis. Discussed dialysis with the patient.   2. Hypertension: home regimen of amlodipine, atenolol, bumetanide, clonidine, enalapril.  Holding all agents at this time.   3. Anemia of chronic kidney disease: hemoglobin 8.5 Normocytic. Iron studies were done on 04/06/20.  - Will discuss with oncology before starting EPO.   4. Secondary Hyperparathyroidism: PTH of 44 on 04/04/20. New PTH pending. Discontinued calcitriol.    LOS: 2 [name] 12/12/202111:59 AM

## 2020-07-16 DIAGNOSIS — Z515 Encounter for palliative care: Secondary | ICD-10-CM

## 2020-07-16 LAB — GLUCOSE, CAPILLARY
Glucose-Capillary: 72 mg/dL (ref 70–99)
Glucose-Capillary: 54 mg/dL — ABNORMAL LOW (ref 70–99)
Glucose-Capillary: 57 mg/dL — ABNORMAL LOW (ref 70–99)
Glucose-Capillary: 70 mg/dL (ref 70–99)
Glucose-Capillary: 105 mg/dL — ABNORMAL HIGH (ref 70–99)
Glucose-Capillary: 117 mg/dL — ABNORMAL HIGH (ref 70–99)

## 2020-07-16 LAB — CBC
HCT: 22.6 % — ABNORMAL LOW (ref 36.0–46.0)
Hemoglobin: 7.3 g/dL — ABNORMAL LOW (ref 12.0–15.0)
MCH: 29.6 pg (ref 26.0–34.0)
MCHC: 32.3 g/dL (ref 30.0–36.0)
MCV: 91.5 fL (ref 80.0–100.0)
Platelets: 402 10*3/uL — ABNORMAL HIGH (ref 150–400)
RBC: 2.47 MIL/uL — ABNORMAL LOW (ref 3.87–5.11)
RDW: 16.5 % — ABNORMAL HIGH (ref 11.5–15.5)
WBC: 24.3 10*3/uL — ABNORMAL HIGH (ref 4.0–10.5)
nRBC: 0 % (ref 0.0–0.2)

## 2020-07-16 LAB — BASIC METABOLIC PANEL
Anion gap: 12 (ref 5–15)
BUN: 95 mg/dL — ABNORMAL HIGH (ref 8–23)
CO2: 22 mmol/L (ref 22–32)
Calcium: 8.3 mg/dL — ABNORMAL LOW (ref 8.9–10.3)
Chloride: 101 mmol/L (ref 98–111)
Creatinine, Ser: 5.29 mg/dL — ABNORMAL HIGH (ref 0.44–1.00)
GFR, Estimated: 9 mL/min — ABNORMAL LOW (ref >60)
Glucose, Bld: 85 mg/dL (ref 70–99)
Potassium: 4.9 mmol/L (ref 3.5–5.1)
Sodium: 135 mmol/L (ref 135–145)

## 2020-07-16 LAB — HEPATITIS B SURFACE ANTIGEN: Hepatitis B Surface Ag: NONREACTIVE

## 2020-07-16 LAB — HEPATITIS B SURFACE ANTIBODY,QUALITATIVE: Hep B S Ab: NONREACTIVE

## 2020-07-16 LAB — GLUCOSE, RANDOM
Glucose, Bld: 82 mg/dL (ref 70–99)
Glucose, Bld: 116 mg/dL — ABNORMAL HIGH (ref 70–99)

## 2020-07-16 LAB — HEPATITIS C ANTIBODY: HCV Ab: NONREACTIVE

## 2020-07-16 LAB — HEPATITIS B CORE ANTIBODY, IGM: Hep B C IgM: NONREACTIVE

## 2020-07-16 MED ORDER — DEXTROSE-NACL 5-0.45 % IV SOLN: INTRAVENOUS | Status: DC

## 2020-07-16 MED ORDER — SODIUM CHLORIDE 0.9 % IV SOLN
2.0000 g | INTRAVENOUS | Status: DC
  Administered 2020-07-16 – 2020-07-18 (×3): 2 g via INTRAVENOUS
  Filled 2020-07-16 (×2): qty 2
  Filled 2020-07-16: qty 0.1
  Filled 2020-07-16: qty 0.4

## 2020-07-16 NOTE — Progress Notes (Signed)
PT Cancellation Note  Patient Details Name: KEVA DARTY MRN: [id number] DOB: 1955-10-04   Cancelled Treatment:    Reason Eval/Treat Not Completed: Other (comment) Pt received in bed with nurse in room. Pt declines participation 2/2 low blood sugar & vomiting this morning; nurse in room & confirms low blood sugar. Will re-attempt as able & pt more medically stable.  Lavone Nian, PT, DPT 07/16/20, 12:08 PM    Waunita Schooner 07/16/2020, 12:07 PM

## 2020-07-16 NOTE — Consult Note (Signed)
[place]  Telephone:([phone] [phone]   Name: [name] Date: 07/16/2020 MRN: [id number]  DOB: Dec 23, 1955  Patient Care Team: [name], [name], MD as Medical Oncologist (Medical Oncology) Corey Skains, MD as Consulting Physician (Cardiology)    REASON FOR CONSULTATION: [name] is a 64 y.o. female with multiple medical problems including stage IV breast cancer widely metastatic to lungs and bone on multiple previous lines of chemotherapy, stage IV CKD, anemia, history of CVA, and diabetes.  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  Patient was hospitalized again 06/02/2020-06/06/2020 with shortness of breath.  She was found to have acute on chronic anemia.  CT of the chest on 06/02/2020 revealed bilateral malignant pleural effusions and worsening lytic lesions in her thoracic spine consistent with disease progression.  Patient is status post left-sided thoracentesis on 11/1 with 500 mL of fluid removed.    Patient has been receiving outpatient chemotherapy with doxorubicin.  She was admitted to the hospital on 07/13/2020 with acute on chronic renal failure.  Palliative care was consulted up address goals.   SOCIAL HISTORY:     reports that she has quit smoking. Her smoking use included cigarettes. She has a 0.50 pack-year smoking history. She has never used smokeless tobacco. She reports that she does not drink alcohol and does not use drugs.  Patient is unmarried.  She lives at home with her fianc.  She has a son who lives nearby.  She has another son in [place] and a daughter in [place], [place].  Patient held a variety of jobs in her lifetime and mostly worked in [place].  ADVANCE DIRECTIVES:  Does not have  CODE  STATUS: DNR  PAST MEDICAL HISTORY: Past Medical History:  Diagnosis Date  . Anemia   . Anxiety   . Asthma   . Cancer ([place]) 03/10/2018   Per NM PET order. Carcinoma of upper-inner quadrant of left breast in female, estrogen receptor positive .  Marland Kitchen Cancer (HCC)    LUNG  . CHF (congestive heart failure) ([place]) 1997  . CKD (chronic kidney disease)   . Depression   . Diabetes mellitus, type 2 ([place])   . Family history of breast cancer   . Family history of colon cancer   . Family history of ovarian cancer   . Family history of pancreatic cancer   . Family history of prostate cancer   . Family history of stomach cancer   . GERD (gastroesophageal reflux disease)    history of an ulcer  . Hair loss   . History of left breast cancer 05/29/14  . History of partial hysterectomy 12/31/2016   Per patient.  Has not had a period in years.  Had a partial hysterectomy years ago.  Marland Kitchen Hypertension   . Mitral valve regurgitation   . Neuromuscular disorder (HCC)    neuropathies in hand  . Obesity   . Pancreatitis 1997  . Stroke [facility]) 2010   with mild left arm weakness    PAST SURGICAL HISTORY:  Past Surgical History:  Procedure Laterality Date  . AMPUTATION Left 03/30/2020   Procedure: AMPUTATION 5th RAY;  Surgeon: Samara Deist, DPM;  Location: [facility] ORS;  Service: Podiatry;  Laterality: Left;  . APPLICATION OF WOUND VAC Left 03/30/2020   Procedure: APPLICATION OF WOUND  VAC;  Surgeon: Samara Deist, DPM;  Location: [facility] ORS;  Service: Podiatry;  Laterality: Left;  . CATARACT EXTRACTION W/PHACO Right 02/24/2019   Procedure: CATARACT EXTRACTION PHACO AND INTRAOCULAR LENS PLACEMENT ([place]) RIGHT DIABETES;  Surgeon: Marchia Meiers, MD;  Location: [facility] ORS;  Service: Ophthalmology;  Laterality: Right;  [place] 01:13.0 CDE 7.96 Fluid Pack Lot # [id number]  . CATARACT EXTRACTION W/PHACO Left 03/24/2019   Procedure: CATARACT EXTRACTION PHACO AND INTRAOCULAR LENS PLACEMENT ([facility]) - left diabetic;  Surgeon:  Marchia Meiers, MD;  Location: [facility] OR[facility];  Service: Ophthalmology;  Laterality: Left;  [place]  01:36 CDE 13.93 Fluid pack lot # [id number]  . CENTRAL LINE INSERTION-TUNNELED N/A 04/04/2020   Procedure: CENTRAL LINE INSERTION-TUNNELED;  Surgeon: Katha Cabal, MD;  Location: [place] CV [facility];  Service: Cardiovascular;  Laterality: N/A;  . CESAREAN SECTION    . CHOLECYSTECTOMY    . DIALYSIS/PERMA CATHETER REMOVAL N/A 05/01/2020   Procedure: DIALYSIS/PERMA CATHETER REMOVAL;  Surgeon: Katha Cabal, MD;  Location: [place] CV LAB;  Service: Cardiovascular;  Laterality: N/A;  . EXCISION OF TONGUE LESION N/A 08/17/2018   Procedure: EXCISION OF TONGUE LESION WITH FROZEN SECTIONS;  Surgeon: Beverly Gust, MD;  Location: [facility] ORS;  Service: ENT;  Laterality: N/A;  . EYE SURGERY Right    cataract extraction  . IRRIGATION AND DEBRIDEMENT FOOT Left 03/30/2020   Procedure: IRRIGATION AND DEBRIDEMENT FOOT;  Surgeon: Samara Deist, DPM;  Location: [facility] OR[facility];  Service: Podiatry;  Laterality: Left;  . PARTIAL HYSTERECTOMY  12/31/2016   Per patient, she has not had a period in years since she had a partial hysterectomy.  Marland Kitchen PORTA CATH INSERTION    . TUBAL LIGATION      HEMATOLOGY/ONCOLOGY HISTORY:  Oncology History Overview Note  # OCT 2015-STAGE IV LEFT BREAST T2N1 [T=4cm; [name]; [name]; [name]; EBUS- Positive Paratrac/subcarinal LN s/p ? Taxotere [in [place]; [name]; [name];[name]; [name]; suv 3]; [name]; [name];[name];  # ? Bony lesions- PET sep 2016-non-hypermetabolic sclerotic lesions T10; Ant R iliac bone; inferior sternum- not on X-geva  # April 2019- PET scan Progression/pleural based lesions effusion/bone  lesions.   # TAXOL   # SEP [id number]- July 2021- ERIBULIN; Progression  # SEP-OCT 2021- GEMCITABINE [interrupted because of hospitalization; progressive disease; discontinued]; #September 2021-left foot osteomyelitis s/p metatarsal amputation [Dr.Fowler/ Dr. Ola Spurr  #October 30th 2021 [hospitalization]-CT scan progressive pleural lesion bilateral pleural effusion/bone lesions; cytology negative status post thoracentesis x2; hemoglobin 6.5 s/p PRBC transfusion Discontinue gemcitabine  # NOV 11th 2021-Doxil weekly.  # Poorly controlled Blood sugars- improved.   # Pancreatitis Hx/PEI- on creon in past / CKD IV [creat ~ 3-4; [name]; [name]; mild left sided weakness]  # Jan 2020-  Lobular lesion on tongue- s/p excision pyogenic granuloma [Dr.McQueen]   # GENETIC TESTING/COUNSELLING: HETEROZYGOUS Cystic Fibrosis Gene [explains hx of recurrent pancreatitis]  # MOLECULAR TESTING: NA   # PALLIATIVE CARE: 1/22-Discussed/Declined ------------------------------------------------   DIAGNOSIS: [ 2015] BREAST CA; [name]; [name] ;GOALS: Palliative  CURRENT/MOST RECENT THERAPY: DOXIL [C].     Carcinoma of upper-inner quadrant of left breast in female, estrogen receptor positive ([place])  04/29/2019 - 02/24/2020 Chemotherapy   The patient had eriBULin mesylate (HALAVEN) 2 mg  in sodium chloride 0.9 % 100 mL chemo infusion, 2 mg, Intravenous,  Once, 12 of 14 cycles Dose modification: 1 mg/m2 (original dose 1 mg/m2, Cycle 1, Reason: Provider Judgment) Administration: 2 mg (06/03/2019), 2 mg (04/29/2019), 2 mg (06/10/2019), 2 mg (07/04/2019), 2 mg (07/11/2019), 2 mg (07/25/2019), 2 mg (08/03/2019), 2 mg (08/19/2019), 2 mg (08/26/2019), 2 mg (09/09/2019), 2 mg (09/16/2019), 2 mg (10/07/2019), 2 mg (10/14/2019), 2 mg (10/28/2019), 2 mg (11/04/2019), 2 mg (11/28/2019), 2 mg (12/09/2019), 2 mg (01/13/2020), 2 mg (01/20/2020), 2 mg (02/02/2020), 2 mg (02/13/2020), 2 mg (02/24/2020)  for chemotherapy  treatment.    04/30/2020 - 05/28/2020 Chemotherapy   The patient had gemcitabine (GEMZAR) 1,600 mg in sodium chloride 0.9 % 250 mL chemo infusion, 1,596 mg, Intravenous,  Once, 2 of 5 cycles Administration: 1,600 mg (04/30/2020), 1,600 mg (05/07/2020), 1,600 mg (05/28/2020)  for chemotherapy treatment.    06/15/2020 -  Chemotherapy   The patient had DOXOrubicin HCL LIPOSOMAL (DOXIL) 30 mg in dextrose 5 % 250 mL chemo infusion, 15 mg/m2 = 30 mg, Intravenous,  Once, 3 of 7 cycles Administration: 30 mg (06/15/2020), 30 mg (06/22/2020), 30 mg (07/02/2020)  for chemotherapy treatment.      ALLERGIES:  is allergic to fish-derived products, sulfamethoxazole-trimethoprim, and chlorhexidine.  MEDICATIONS:  Current Facility-Administered Medications  Medication Dose Route Frequency Provider Last Rate Last Admin  . acetaminophen-codeine (TYLENOL #3) 300-30 MG per tablet 1 tablet  1 tablet Oral BID Gwynne Edinger, MD   1 tablet at 07/16/20 6803   And  . codeine tablet 30 mg  30 mg Oral BID Gwynne Edinger, MD   30 mg at 07/16/20 0914  . albuterol (VENTOLIN HFA) 108 (90 Base) MCG/ACT inhaler 2 puff  2 puff Inhalation Q6H PRN Agbata, Tochukwu, MD      . ALPRAZolam Duanne Moron) tablet 0.5 mg  0.5 mg Oral BID PRN Agbata, Tochukwu, MD      . aspirin EC tablet 81 mg  81 mg Oral Daily Agbata, Tochukwu, MD   81 mg at 07/16/20 0914  . ceFEPIme (MAXIPIME) 2 g in sodium chloride 0.9 % 100 mL IVPB  2 g Intravenous Q24H Lockie Mola B, RPH 200 mL/hr at 07/16/20 1156 2 g at 07/16/20 1156  . dextrose 5 %-0.45 % sodium chloride infusion   Intravenous Continuous Gwynne Edinger, MD 50 mL/hr at 07/16/20 1349 New Bag at 07/16/20 1349  . docusate sodium (COLACE) capsule 100 mg  100 mg Oral Daily Agbata, Tochukwu, MD   100 mg at 07/16/20 0914  . famotidine (PEPCID) tablet 20 mg  20 mg Oral Daily Agbata, Tochukwu, MD   20 mg at 07/16/20 0913  . ferrous sulfate tablet 325 mg  325 mg Oral Q breakfast Agbata, Tochukwu,  MD   325 mg at 07/16/20 0914  . FLUoxetine (PROZAC) capsule 20 mg  20 mg Oral BID Agbata, Tochukwu, MD   20 mg at 07/16/20 0914  . gabapentin (NEURONTIN) capsule 100 mg  100 mg Oral QHS Agbata, Tochukwu, MD   100 mg at 07/15/20 2129  . heparin injection 5,000 Units  5,000 Units Subcutaneous Q8H Agbata, Tochukwu, MD   5,000 Units at 07/16/20 1350  . ondansetron (ZOFRAN) tablet 4 mg  4 mg Oral Q6H PRN Agbata, Tochukwu, MD       Or  . ondansetron (ZOFRAN) injection 4 mg  4 mg Intravenous Q6H PRN Agbata, Tochukwu, MD      . senna (SENOKOT) tablet 8.6 mg  1 tablet  Oral BID PRN Agbata, Tochukwu, MD      . simvastatin (ZOCOR) tablet 20 mg  20 mg Oral QPM Agbata, Tochukwu, MD   20 mg at 07/15/20 1704   Facility-Administered Medications Ordered in Other Encounters  Medication Dose Route Frequency Provider Last Rate Last Admin  . sodium chloride flush (NS) 0.9 % injection 10 mL  10 mL Intravenous PRN Cammie Sickle, MD   10 mL at 01/30/16 1054    VITAL SIGNS: BP (!) 148/88 (BP Location: Right Arm)   Pulse 85   Temp 97.7 F (36.5 C) (Oral)   Resp 20   Ht _0  (1.575 m)   Wt 181 lb 11.2 oz (82.4 kg)   SpO2 91%   BMI 33.23 kg/m  Filed Weights   07/14/20 1338 07/15/20 0454 07/16/20 0511  Weight: 179 lb 12.8 oz (81.6 kg) 187 lb 14.4 oz (85.2 kg) 181 lb 11.2 oz (82.4 kg)    Estimated body mass index is 33.23 kg/m as calculated from the following:   Height as of this encounter: _1  (1.575 m).   Weight as of this encounter: 181 lb 11.2 oz (82.4 kg).  LABS: CBC:    Component Value Date/Time   WBC 24.3 (H) 07/16/2020 0608   HGB 7.3 (L) 07/16/2020 0608   HGB 12.5 06/06/2014 1102   HCT 22.6 (L) 07/16/2020 0608   HCT 37.6 06/06/2014 1102   PLT 402 (H) 07/16/2020 0608   PLT 396 06/06/2014 1102   MCV 91.5 07/16/2020 0608   MCV 91 06/06/2014 1102   NEUTROABS 19.8 (H) 07/13/2020 0825   NEUTROABS 6.9 (H) 06/06/2014 1102   LYMPHSABS 0.5 (L) 07/13/2020 0825   LYMPHSABS 2.0 06/06/2014  1102   MONOABS 1.6 (H) 07/13/2020 0825   MONOABS 0.7 06/06/2014 1102   EOSABS 0.0 07/13/2020 0825   EOSABS 0.0 06/06/2014 1102   BASOSABS 0.1 07/13/2020 0825   BASOSABS 0.1 06/06/2014 1102   Comprehensive Metabolic Panel:    Component Value Date/Time   NA 135 07/16/2020 0608   NA 130 (L) 06/06/2014 1102   K 4.9 07/16/2020 0608   K 3.9 06/06/2014 1102   CL 101 07/16/2020 0608   CL 95 (L) 06/06/2014 1102   CO2 22 07/16/2020 0608   CO2 28 06/06/2014 1102   BUN 95 (H) 07/16/2020 0608   BUN 17 06/06/2014 1102   CREATININE 5.29 (H) 07/16/2020 0608   CREATININE 1.63 (H) 06/06/2014 1102   GLUCOSE 82 07/16/2020 1250   GLUCOSE 349 (H) 06/06/2014 1102   CALCIUM 8.3 (L) 07/16/2020 0608   CALCIUM 9.2 06/06/2014 1102   AST 14 (L) 07/13/2020 0825   AST 7 (L) 06/06/2014 1102   ALT 12 07/13/2020 0825   ALT 12 (L) 06/06/2014 1102   ALKPHOS 90 07/13/2020 0825   ALKPHOS 74 06/06/2014 1102   BILITOT 0.8 07/13/2020 0825   BILITOT 0.4 06/06/2014 1102   PROT 7.2 07/13/2020 0825   PROT 8.2 06/06/2014 1102   ALBUMIN 2.2 (L) 07/13/2020 0825   ALBUMIN 3.3 (L) 06/06/2014 1102    RADIOGRAPHIC STUDIES: US Renal  Result Date: 07/13/2020 CLINICAL DATA:  64 year old female with acute kidney injury EXAM: RENAL / URINARY TRACT ULTRASOUND COMPLETE COMPARISON:  None. FINDINGS: Right Kidney: Renal measurements: 10.6 x 4.7 x 4.6 cm = volume: 120 mL. The renal parenchyma is increased in echogenicity. There is also increased conspicuity between the corticomedullary interface. Circumscribed anechoic simple cyst with imperceptible walls are present. There are 2 measured at 1.9 x  1.8 x 1.9 cm and 1.7 x 1.3 x 1.4 cm. The larger cyst is present exophytic from the lower pole while the smaller cyst is partially exophytic from the interpolar region. No hydronephrosis or nephrolithiasis. Left Kidney: Renal measurements: 10.6 x 5.1 x 4.1 cm = volume: 117 mL. Increased parenchymal echogenicity. No hydronephrosis or  nephrolithiasis. Tiny anechoic simple cyst measures 0.8 x 0.8 x 0.6 cm. Bladder: Appears normal for degree of bladder distention. Other: None. IMPRESSION: 1. No evidence of hydronephrosis. 2. Echogenic renal parenchyma bilaterally consistent with medical renal disease. 3. Simple renal cysts bilaterally. Electronically Signed   By: Jacqulynn Cadet M.D.   On: 07/13/2020 13:09   DG Chest Port 1 View  Result Date: 07/13/2020 CLINICAL DATA:  Questionable sepsis. EXAM: PORTABLE CHEST 1 VIEW COMPARISON:  06/05/2020 and older exams. FINDINGS: There is bilateral lung base opacity obscuring hemidiaphragms consistent with a combination of pleural effusions and atelectasis and/or infection. Upper lungs remain clear. No pneumothorax. Cardiac silhouette is normal in size.  No mediastinal masses. Right anterior chest wall Port-A-Cath is stable. Skeletal structures are grossly intact. IMPRESSION: 1. Bilateral pleural effusions with associated lung base opacity, similar to the prior chest radiograph. Lung base opacities may be due to atelectasis, pneumonia or a combination. No convincing pulmonary edema. No pneumothorax. Electronically Signed   By: Lajean Manes M.D.   On: 07/13/2020 12:10    PERFORMANCE STATUS (ECOG) : 3 - Symptomatic, >50% confined to bed  Review of Systems Unless otherwise noted, a complete review of systems is negative.  Physical Exam General: NAD Pulmonary: Unlabored Extremities: no edema, no joint deformities Skin: no rashes Neurological: Weakness but otherwise nonfocal  IMPRESSION: I met with patient following her visit with the hospitalist.  Patient tells me that she understands that she is not felt to be a viable candidate for further chemotherapy.  Hospice was recommended and we discussed that in detail.  Patient states that she is familiar with hospice from the care of her mother at end-of-life in [place], [place].  Patient states that she would be in agreement with hospice at time  of discharge.  Patient verbalized consent for me to call her son.  I called her son.  He is interested in meeting with Dr. Rogue Bussing and me tomorrow at noon.  Patient remains with persistently elevated serum creatinine and reduced urine output.  Patient told me that she does not think she would be interested in dialysis.  She says that her primary goal would be to "take it a day at a time" but to focus on comfort at home.  However, I do not think that patient realizes how rapidly she could decline secondary to ESRD and terminal cancer.  PLAN: -Continue current scope of treatment -Family meeting planned tomorrow  Case and plan discussed with Drs. [place], [place], and [employer].   Time Total: 60 minutes  Visit consisted of counseling and education dealing with the complex and emotionally intense issues of symptom management and palliative care in the setting of serious and potentially life-threatening illness.Greater than 50%  of this time was spent counseling and coordinating care related to the above assessment and plan.  Signed by: Altha Harm, PhD, NP-C

## 2020-07-16 NOTE — Progress Notes (Signed)
[facility]  ROUNDING NOTE   Subjective:   Patient resting in bed, appears pleasant and comfortable.She denies worsening SOB, continues to require 4L supplemental O2 via [place].  Objective:  Vital signs in last 24 hours:  Temp:  [97.3 F (36.3 C)-98.2 F (36.8 C)] 97.7 F (36.5 C) (12/13 1102) Pulse Rate:  [70-85] 85 (12/13 1102) Resp:  [17-20] 20 (12/13 1102) BP: (117-148)/(70-88) 148/88 (12/13 1102) SpO2:  [91 %-100 %] 91 % (12/13 1102) Weight:  [82.4 kg] 82.4 kg (12/13 0511)  Weight change: 0.862 kg Filed Weights   07/14/20 1338 07/15/20 0454 07/16/20 0511  Weight: 81.6 kg 85.2 kg 82.4 kg    Intake/Output: I/O last 3 completed shifts: In: 1355.9 [I.V.:1355.9] Out: 825 [Urine:825]   Intake/Output this shift:  No intake/output data recorded.  Physical Exam: General: Resting in bed, in no acute distress  Head: Normocephalic, atraumatic. oral mucosal membranes moist  Eyes: Sclerae and conjunctivae clear  Lungs:  Respirations even, unlabored, lungs with fine crackles  Heart: Regular rate and rhythm  Abdomen:  Soft, nontender, non distended  Extremities:  1+ peripheral edema  Neurologic: Awake,alert, oriented  Skin: No acute lesions or rashes    Basic Metabolic Panel: Recent Labs  Lab 07/13/20 0825 07/13/20 2335 07/14/20 0434 07/15/20 0656 07/16/20 0608 07/16/20 1250  NA 128* 131* 129* 132* 135  --   K 5.1 5.0 5.0 4.6 4.9  --   CL 88* 93* 93* 98 101  --   CO2 26 25 25 22 22   --   GLUCOSE 187* 155* 180* 67* 85 82  BUN 107* 106* 102* 97* 95*  --   CREATININE 6.58* 6.07* 6.01* 5.75* 5.29*  --   CALCIUM 9.0 8.4* 8.2* 8.5* 8.3*  --     Liver Function Tests: Recent Labs  Lab 07/13/20 0825  AST 14*  ALT 12  ALKPHOS 90  BILITOT 0.8  PROT 7.2  ALBUMIN 2.2*   No results for input(s): LIPASE, AMYLASE in the last 168 hours. No results for input(s): AMMONIA in the last 168 hours.  CBC: Recent Labs  Lab 07/13/20 0825 07/14/20 0434  07/15/20 0656 07/16/20 0608  WBC 22.3* 22.0* 22.2* 24.3*  NEUTROABS 19.8*  --   --   --   HGB 8.6* 7.7* 8.5* 7.3*  HCT 25.0* 23.0* 25.3* 22.6*  MCV 88.3 89.5 89.7 91.5  PLT 358 334 385 402*    Cardiac Enzymes: No results for input(s): CKTOTAL, CKMB, CKMBINDEX, TROPONINI in the last 168 hours.  BNP: Invalid input(s): POCBNP  CBG: Recent Labs  Lab 07/15/20 2048 07/16/20 0736 07/16/20 1130 07/16/20 1144 07/16/20 1228  GLUCAP 130* 72 57* 54* 70    Microbiology: Results for orders placed or performed during the hospital encounter of 07/13/20  Resp Panel by RT-PCR (Flu A&B, Covid) Nasopharyngeal Swab     Status: None   Collection Time: 07/13/20 11:56 AM   Specimen: Nasopharyngeal Swab; Nasopharyngeal(NP) swabs in vial transport medium  Result Value Ref Range Status   SARS Coronavirus 2 by RT PCR NEGATIVE NEGATIVE Final    Comment: (NOTE) SARS-CoV-2 target nucleic acids are NOT DETECTED.  The SARS-CoV-2 RNA is generally detectable in upper respiratory specimens during the acute phase of infection. The lowest concentration of SARS-CoV-2 viral copies this assay can detect is 138 copies/mL. A negative result does not preclude SARS-Cov-2 infection and should not be used as the sole basis for treatment or other patient management decisions. A negative result may occur with  improper specimen collection/handling, submission of specimen other than nasopharyngeal swab, presence of viral mutation(s) within the areas targeted by this assay, and inadequate number of viral copies(<138 copies/mL). A negative result must be combined with clinical observations, patient history, and epidemiological information. The expected result is Negative.  Fact Sheet for Patients:  EntrepreneurPulse.com.au  Fact Sheet for Healthcare Providers:  IncredibleEmployment.be  This test is no t yet approved or cleared by the [place] FDA and  has been authorized  for detection and/or diagnosis of SARS-CoV-2 by FDA under an Emergency Use Authorization (EUA). This EUA will remain  in effect (meaning this test can be used) for the duration of the COVID-19 declaration under Section 564(b)(1) of the Act, 21 U.S.C.section 360bbb-3(b)(1), unless the authorization is terminated  or revoked sooner.       Influenza A by PCR NEGATIVE NEGATIVE Final   Influenza B by PCR NEGATIVE NEGATIVE Final    Comment: (NOTE) The Xpert Xpress SARS-CoV-2/FLU/RSV plus assay is intended as an aid in the diagnosis of influenza from Nasopharyngeal swab specimens and should not be used as a sole basis for treatment. Nasal washings and aspirates are unacceptable for Xpert Xpress SARS-CoV-2/FLU/RSV testing.  Fact Sheet for Patients: EntrepreneurPulse.com.au  Fact Sheet for Healthcare Providers: IncredibleEmployment.be  This test is not yet approved or cleared by the [place] FDA and has been authorized for detection and/or diagnosis of SARS-CoV-2 by FDA under an Emergency Use Authorization (EUA). This EUA will remain in effect (meaning this test can be used) for the duration of the COVID-19 declaration under Section 564(b)(1) of the Act, 21 U.S.C. section 360bbb-3(b)(1), unless the authorization is terminated or revoked.  Performed at [facility], [address] [place], [place] [address]   Blood culture (routine x 2)     Status: None (Preliminary result)   Collection Time: 07/13/20 11:56 AM   Specimen: BLOOD  Result Value Ref Range Status   Specimen Description BLOOD PORTA CATH  Final   Special Requests   Final    BOTTLES DRAWN AEROBIC AND ANAEROBIC Blood Culture adequate volume   Culture   Final    NO GROWTH 3 DAYS Performed at [facility], [address] [place], [place] [address]    Report Status PENDING  Incomplete  Blood culture (routine x 2)     Status: None (Preliminary result)   Collection  Time: 07/13/20 11:56 AM   Specimen: BLOOD  Result Value Ref Range Status   Specimen Description BLOOD LEFT ANTECUBITAL  Final   Special Requests   Final    BOTTLES DRAWN AEROBIC AND ANAEROBIC Blood Culture adequate volume   Culture   Final    NO GROWTH 3 DAYS Performed at [facility], [address] [place], [place] [address]    Report Status PENDING  Incomplete  Urine Culture     Status: None   Collection Time: 07/14/20  5:45 AM   Specimen: Urine, Random  Result Value Ref Range Status   Specimen Description   Final    URINE, RANDOM Performed at [facility], [address] [place], [place] [address]    Special Requests   Final    NONE Performed at [facility], [address], [place], [place] [address]    Culture   Final    NO GROWTH Performed at [facility], [place] [address] [place],  [address]    Report Status 07/15/2020 FINAL  Final    Coagulation Studies: Recent  Labs    07/14/20 0542  LABPROT 15.2  INR 1.3*    Urinalysis: Recent Labs    07/14/20 0545  COLORURINE YELLOW*  LABSPEC 1.012  PHURINE 5.0  GLUCOSEU 50*  HGBUR NEGATIVE  BILIRUBINUR NEGATIVE  KETONESUR NEGATIVE  PROTEINUR 30*  NITRITE NEGATIVE  LEUKOCYTESUR NEGATIVE      Imaging: No results found.   Medications:   . ceFEPime (MAXIPIME) IV 2 g (07/16/20 1156)  . dextrose 5 % and 0.45% NaCl 50 mL/hr at 07/16/20 1349   . acetaminophen-codeine  1 tablet Oral BID   And  . codeine  30 mg Oral BID  . aspirin EC  81 mg Oral Daily  . docusate sodium  100 mg Oral Daily  . famotidine  20 mg Oral Daily  . ferrous sulfate  325 mg Oral Q breakfast  . FLUoxetine  20 mg Oral BID  . gabapentin  100 mg Oral QHS  . heparin  5,000 Units Subcutaneous Q8H  . simvastatin  20 mg Oral QPM   albuterol, ALPRAZolam, ondansetron **OR** ondansetron (ZOFRAN) IV, senna  Assessment/ Plan:  [name] is a 64 y.o. black female with stage IV breast cancer,  diabetes mellitus type II insulin dependent, asthma, CVA with left residual weakness, congestive heart failure, depression, hypertension, hyperlipidemia who was admitted to [facility] on 07/13/2020 for Cough [R05.9] Cancer ([place]) [C80.1] Chronic respiratory failure with hypoxia ([place]) [J96.11] AKI (acute kidney injury) ([place]) [N17.9] S/P amputation of lesser toe, left ([facility]) [Z89.422] Leukocytosis, unspecified type [D72.829] Sepsis, due to unspecified organism, unspecified whether acute organ dysfunction present ([place]) [A41.9]  1. Acute Kidney Injury on chronic kidney disease stage IV with proteinuria: baseline creatinine of  2.44, GFR of 22 on 07/02/2020.  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   Lab Results  Component Value Date   CREATININE 5.29 (H) 07/16/2020   CREATININE 5.75 (H) 07/15/2020   CREATININE 6.01 (H) 07/14/2020   Renal function slightly improved Recorded Urine output for the preceding 24 hours is 525 ml No acute indication for dialysis  Enalapril and Bumex on hold Will continue IV fluids   2. Hypertension: BP 148/88 today  Patient's home regimen includes amlodipine, atenolol, bumetanide, clonidine, enalapril.  Currently on hold  3. Anemia of chronic kidney disease Lab Results  Component Value Date   HGB 7.3 (L) 07/16/2020  Will continue monitoring CBCs closely  4. Secondary Hyperparathyroidism Lab Results  Component Value Date   PTH 44 04/04/2020   CALCIUM 8.3 (L) 07/16/2020   PHOS 2.9 04/04/2020  Will continue monitoring bone mineral metabolism parameters   LOS: 3 [name] 12/13/20213:12 PM

## 2020-07-16 NOTE — Progress Notes (Signed)
[name]   DOB:September 18, 1955   [id number]    Subjective: No acute events overnight.  Denies any pain.  Denies any nausea vomiting.  No chest pain.  Objective:  Vitals:   07/16/20 1602 07/16/20 1941  BP: 106/62 105/62  Pulse: 74 76  Resp: 17 17  Temp: 98.5 F (36.9 C) 98 F (36.7 C)  SpO2: 98% 98%     Intake/Output Summary (Last 24 hours) at 07/16/2020 2031 Last data filed at 07/16/2020 0500 Gross per 24 hour  Intake --  Output 300 ml  Net -300 ml    Physical Exam Constitutional:      Comments: Frail-appearing female patient.  Temporal wasting  HENT:     Head: Normocephalic and atraumatic.     Mouth/Throat:     Mouth: Oropharynx is clear and moist.     Pharynx: No oropharyngeal exudate.  Eyes:     Pupils: Pupils are equal, round, and reactive to light.  Cardiovascular:     Rate and Rhythm: Normal rate and regular rhythm.  Pulmonary:     Effort: No respiratory distress.     Breath sounds: No wheezing.     Comments: Decreased breath sounds bilaterally right more than left. Abdominal:     General: Bowel sounds are normal. There is no distension.     Palpations: Abdomen is soft. There is no mass.     Tenderness: There is no abdominal tenderness. There is no guarding or rebound.  Musculoskeletal:        General: No tenderness or edema. Normal range of motion.     Cervical back: Normal range of motion and neck supple.  Skin:    General: Skin is warm.     Comments: Bilateral lower extremities-wounds left more than right  Neurological:     Mental Status: She is alert and oriented to person, place, and time.  Psychiatric:        Mood and Affect: Affect normal.      Labs:  Lab Results  Component Value Date   WBC 24.3 (H) 07/16/2020   HGB 7.3 (L) 07/16/2020   HCT 22.6 (L) 07/16/2020   MCV 91.5 07/16/2020   PLT 402 (H) 07/16/2020   NEUTROABS 19.8 (H) 07/13/2020    Lab Results  Component Value Date   NA 135 07/16/2020   K 4.9 07/16/2020   CL 101  07/16/2020   CO2 22 07/16/2020    Studies:  No results found.  Carcinoma of upper-inner quadrant of left breast in female, estrogen receptor positive ([place]) 64 year old female patient with a history of metastatic/stage IV breast cancer multiple other medical problems including chronic kidney disease stage IV, poorly controlled diabetes; diabetic foot ulcer-is currently admitted to hospital for hypotension/leukocytosis and acute renal failure.  # Stage IV breast cancer ER/PR positive HER-2 negative -October 30 CT scan-progression of disease-pleural metastases/bilateral effusion; worsening lytic lesions in the bones;  Currently on weekly DOXIL #3 so far.  See discussion below  #Leukocytosis/hypotension systolic 50Y blood pressure/acute on chronic renal failure-prerenal/overdiuresis versus others.  Clinically stable however leukocytosis rising 24,000.  Question occult infection/especially given the immunocompromise state.  Consider empiric antibiotics even though infectious work-up so far negative.  # Anemia- sec to chemo-CKD- IV-in general hemoglobin around 7-8.  If tomorrow hemoglobin less than 8 recommend PRBC transfusion  #DNR/DNI/patient understands continued poor prognosis, however reluctant to discuss the prognosis/plan of care moving forward.  Discussed regarding hospice which patient is quite reluctant.  However understands that hospice at her  own home might be the best option moving forward for her current condition.  Patient is extremely high risk of complications from further chemotherapy with modest if at all any benefit from chemotherapy.  S/p evaluation with Josh Borders/palliative care..  Discussed with Josh Borders.  We will plan to have family meeting tomorrow at noon.   Cammie Sickle, MD 07/16/2020  8:31 PM

## 2020-07-16 NOTE — Consult Note (Signed)
Pharmacy Antibiotic Note  [name] is a 64 y.o. female with medical history including stage IV breast cancer on chemotherapy, stage IV CKD, chronic respiratory failure admitted on 07/13/2020 with abnormal labs.  Pharmacy has been consulted for cefepime dosing.  Plan: Cefepime 2 g IV q24h  Continue to monitor renal function and adjust antibiotic dosing as indicated. Scr 6.58 on admission to 5.29 today.   Height: 5\' 2"  (157.5 cm) Weight: 82.4 kg (181 lb 11.2 oz) IBW/kg (Calculated) : 50.1  Temp (24hrs), Avg:97.8 F (36.6 C), Min:97.3 F (36.3 C), Max:98.2 F (36.8 C)  Recent Labs  Lab 07/13/20 0825 07/13/20 1156 07/13/20 2335 07/14/20 0434 07/15/20 0656 07/16/20 0608  WBC 22.3*  --   --  22.0* 22.2* 24.3*  CREATININE 6.58*  --  6.07* 6.01* 5.75* 5.29*  LATICACIDVEN  --  1.5  --   --   --   --     Estimated Creatinine Clearance: 10.8 mL/min (A) (by C-G formula based on SCr of 5.29 mg/dL (H)).    Allergies  Allergen Reactions  . Fish-Derived Products Anaphylaxis  . Sulfamethoxazole-Trimethoprim Other (See Comments)    Caused kidney issues  . Chlorhexidine     Antimicrobials this admission: Vancomcyin 12/10 x 1 Cefepime 12/10 x 1, 12/13 >>   Dose adjustments this admission: N/A  Microbiology results: 12/10 BCx: NGTD 12/11 UCx: No growth   Thank you for allowing pharmacy to be a part of this patient's care.  Benita Gutter 07/16/2020 10:22 AM

## 2020-07-16 NOTE — Care Management Important Message (Signed)
Important Message  Patient Details  Name: [name] MRN: [id number] Date of Birth: 12-05-1955   Medicare Important Message Given:  Yes     Dannette Barbara 07/16/2020, 9:13 AM

## 2020-07-16 NOTE — Progress Notes (Signed)
PROGRESS NOTE    [name]  DZH:[id number] DOB: [birth year] DOA: 07/13/2020 PCP: [name], MD  Outpatient Specialists: nephrology, oncology    Brief Narrative:   [name] is a 64 y.o. female with medical history significant forstage IV breast cancer on chemotherapy, stage IV chronic kidney disease, depression, chronic respiratory failure, hypertension, GERD who was sent to the emergency room from the cancer center for evaluation of abnormal labs.  Patient had gone to the cancer center for chemotherapy and labs drawn showed worsening of her renal function from baseline.  At baseline she has a serum creatinine of 2.4 but today on admission her serum creatinine was 6.5.  Patient states that she cut back on her fluid intake because she recently had thoracentesis done in the hospital with drainage of about a liter of fluid from both lungs.  She states that her mouth stays dry but denies having any urinary frequency or nocturia.  She denies feeling dizzy or lightheaded and had a blood pressure in the 80s at the cancer center for which she received 1 L of IV fluid. She has a cough productive of clear phlegm. She denies having any fever or chills, no abdominal pain, no chest pain, no nausea, no vomiting, no diaphoresis or palpitations.   Assessment & Plan:   Principal Problem:   AKI (acute kidney injury) ([place]) Active Problems:   Benign essential HTN   Anxiety and depression   Type 2 diabetes mellitus with diabetic chronic kidney disease ([place])   Anemia of chronic disease   Metastatic breast cancer (H[facility])   Antineoplastic chemotherapy induced anemia   Weakness generalized   Malignant pleural effusion   Leukocytosis   Hyponatremia   Pressure injury of skin   # Acute kidney injury on ckd stage 4 Prerenal, possible ATN, likely 2/2 purposeful decreased PO (pt was hoping to avoid reaccumulation of pleural effusions) and diuretic use. At baseline patient has stage IV chronic  kidney disease with a serum creatinine of 2.4 and on admission it is 6.5 with a normal serum bicarbonate level and upper level of normal K. No symptoms of infection and w/u thus far negative. Today Cr mildly improved to 5.29, k wnl. UOP yesterday 525. Renal u/s 12/10 w/o hydro, kidneys w/ signs medical renal disease. No indication for dialysis and not recommended given terminal dx. - continue to hold home bumex and home bp meds  - s/p 1.5 L bolus, now on d5 1/2 ns @ 50 - trend kidney function - nephrology consulted, recs appreciated  # Metastatic breast cancer # Malignant pleural effusions Patient with known stage IV breast cancer on palliative chemotherapy doxil weekly CT scan of the chest without contrast showsbilateral malignant pleural effusions, which are only slightly larger than the comparison PET CT of 03/28/2020, with associated evidence of visceral/parietal pleural metastases. Re demonstrationofskeletal metastases, with new/enlarging lytic lesions of the thoracic spine compatible with progression. The left breast mass appears to be enlarging compared to the prior PET. Patient is status post thoracentesis about 4 weeks ago with drainage of about 1 L of pleural fluid. Respiratory status stable. Oncology following, recs appreciated. Overall prognosis is poor - onc palliative consult today - holding on repeat thoracentesis as respiratory status appears to be at baseline  # Chronic hypoxic respiratory failure On 4 L at home, stable here. 2/2 metastatic breast cancer and malignant pleural effusions - cont O2  # Leukocytosis # Recent left foot osteomyelitis Here with hypotension (resolved) and AKI likely 2/2  dehydration. Recent admission for left foot osteo s/p partial ray amputation and IV abx. Has been followed by wound, reported to be healing. No signs of worsening infection today. CXR without obvious PNA and patient says breathing is stable. Lactate wnl. No fevers, no dysuria. No abd  pain or n/v/f. Received vanc/cefepime 12/10.  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 H 7.3, no bleeding - monitor - may need transfusion - cont home iron  # Hypertension Here hypotensive on arrive, now normotensive - holding home bumex, amlodipine, atenolol, clonidine, enalapril   # Depression and anxiety - Continue alprazolam and fluoxetine  # Chronic pain - home gabapentin  # Hx CVA - cont home aspirin, statin  # Constipation - cont home colace, senna  # History of diastolic dysfunction CHF - Hold Bumex  # Stage 2 sacral decubitus ulcer - POA # Left foot ulcer - Wound care consulted   DVT prophylaxis: heparin Code Status: DNR Family Communication: none @ bedside  Status is: Inpatient  Remains inpatient appropriate because:Inpatient level of care appropriate due to severity of illness   Dispo: The patient is from: Home              Anticipated d/c is to: TBD, poss home w/ home hospice              Anticipated d/c date is: 3-5 days              Patient currently is not medically stable to d/c.        Consultants:  Oncology, nephrology  Procedures: none  Antimicrobials:  Vanc/cefepime 12/10  Cefepime 12/13>   Subjective: This morning nausea after eating breakfast, resolved. Breathing at baseline. No abd pain. No fevers.   Objective: Vitals:   07/15/20 1941 07/16/20 0511 07/16/20 0735 07/16/20 1102  BP: 117/73 122/72 124/70 (!) 148/88  Pulse: 70 73 73 85  Resp: 17 17 18 20   Temp: 97.9 F (36.6 C) 98 F (36.7 C) (!) 97.3 F (36.3  C) 97.7 F (36.5 C)  TempSrc: Oral Oral Oral Oral  SpO2: 100% 98% 99% 91%  Weight:  82.4 kg    Height:        Intake/Output Summary (Last 24 hours) at 07/16/2020 1229 Last data filed at 07/16/2020 0500 Gross per 24 hour  Intake --  Output 400 ml  Net -400 ml   Filed Weights   07/14/20 1338 07/15/20 0454 07/16/20 0511  Weight: 81.6 kg 85.2 kg 82.4 kg    Examination:  General exam: Appears calm and comfortable  Respiratory system: decreased breath sounds at bases, scattered rhonchi Cardiovascular system: S1 & S2 heard, rrr, soft systolic murmur Gastrointestinal system: Abdomen is nondistended, soft and nontender. No organomegaly or masses felt. Normal bowel sounds heard. Central nervous system: Alert and oriented. No focal neurological deficits. Extremities: moving all 4 equally Skin: 1+ LE edema, ulcer left lateral foot no surrounding erythema. Small stage 2 several sacral decub ulcers Psychiatry: Judgement and insight appear normal. Mood & affect appropriate.   Lines: Right subclavian port  Data Reviewed: I have personally reviewed following labs and imaging  studies  CBC: Recent Labs  Lab 07/13/20 0825 07/14/20 0434 07/15/20 0656 07/16/20 0608  WBC 22.3* 22.0* 22.2* 24.3*  NEUTROABS 19.8*  --   --   --   HGB 8.6* 7.7* 8.5* 7.3*  HCT 25.0* 23.0* 25.3* 22.6*  MCV 88.3 89.5 89.7 91.5  PLT 358 334 385 626*   Basic Metabolic Panel: Recent Labs  Lab 07/13/20 0825 07/13/20 2335 07/14/20 0434 07/15/20 0656 07/16/20 0608  NA 128* 131* 129* 132* 135  K 5.1 5.0 5.0 4.6 4.9  CL 88* 93* 93* 98 101  CO2 26 25 25 22 22   GLUCOSE 187* 155* 180* 67* 85  BUN 107* 106* 102* 97* 95*  CREATININE 6.58* 6.07* 6.01* 5.75* 5.29*  CALCIUM 9.0 8.4* 8.2* 8.5* 8.3*   GFR: Estimated Creatinine Clearance: 10.8 mL/min (A) (by C-G formula based on SCr of 5.29 mg/dL (H)). Liver Function Tests: Recent Labs  Lab 07/13/20 0825  AST 14*  ALT 12  ALKPHOS 90  BILITOT 0.8  PROT 7.2   ALBUMIN 2.2*   No results for input(s): LIPASE, AMYLASE in the last 168 hours. No results for input(s): AMMONIA in the last 168 hours. Coagulation Profile: Recent Labs  Lab 07/14/20 0542  INR 1.3*   Cardiac Enzymes: No results for input(s): CKTOTAL, CKMB, CKMBINDEX, TROPONINI in the last 168 hours. BNP (last 3 results) No results for input(s): PROBNP in the last 8760 hours. HbA1C: Recent Labs    07/13/20 2209  HGBA1C 7.7*   CBG: Recent Labs  Lab 07/15/20 1612 07/15/20 2048 07/16/20 0736 07/16/20 1130 07/16/20 1144  GLUCAP 155* 130* 72 57* 54*   Lipid Profile: No results for input(s): CHOL, HDL, LDLCALC, TRIG, CHOLHDL, LDLDIRECT in the last 72 hours. Thyroid Function Tests: No results for input(s): TSH, T4TOTAL, FREET4, T3FREE, THYROIDAB in the last 72 hours. Anemia Panel: No results for input(s): VITAMINB12, FOLATE, FERRITIN, TIBC, IRON, RETICCTPCT in the last 72 hours. Urine analysis:    Component Value Date/Time   COLORURINE YELLOW (A) 07/14/2020 0545   APPEARANCEUR HAZY (A) 07/14/2020 0545   LABSPEC 1.012 07/14/2020 0545   PHURINE 5.0 07/14/2020 0545   GLUCOSEU 50 (A) 07/14/2020 0545   HGBUR NEGATIVE 07/14/2020 0545   [place] 07/14/2020 0545   [place] 07/14/2020 0545   PROTEINUR 30 (A) 07/14/2020 0545   NITRITE NEGATIVE 07/14/2020 0545   LEUKOCYTESUR NEGATIVE 07/14/2020 0545   Sepsis Labs: @LABRCNTIP (procalcitonin:4,lacticidven:4)  ) Recent Results (from the past 240 hour(s))  Resp Panel by RT-PCR (Flu A&B, Covid) Nasopharyngeal Swab     Status: None   Collection Time: 07/13/20 11:56 AM   Specimen: Nasopharyngeal Swab; Nasopharyngeal(NP) swabs in vial transport medium  Result Value Ref Range Status   SARS Coronavirus 2 by RT PCR NEGATIVE NEGATIVE Final    Comment: (NOTE) SARS-CoV-2 target nucleic acids are NOT DETECTED.  The SARS-CoV-2 RNA is generally detectable in upper respiratory specimens during the acute phase of  infection. The lowest concentration of SARS-CoV-2 viral copies this assay can detect is 138 copies/mL. A negative result does not preclude SARS-Cov-2 infection and should not be used as the sole basis for treatment or other patient management decisions. A negative result may occur with  improper specimen collection/handling, submission of specimen other than nasopharyngeal swab, presence of viral mutation(s) within the areas targeted by this assay, and inadequate number of viral copies(<138 copies/mL). A negative result must be combined with clinical observations, patient history, and epidemiological information. The expected result is Negative.  Fact Sheet  for Patients:  EntrepreneurPulse.com.au  Fact Sheet for Healthcare Providers:  IncredibleEmployment.be  This test is no t yet approved or cleared by the [place] FDA and  has been authorized for detection and/or diagnosis of SARS-CoV-2 by FDA under an Emergency Use Authorization (EUA). This EUA will remain  in effect (meaning this test can be used) for the duration of the COVID-19 declaration under Section 564(b)(1) of the Act, 21 U.S.C.section 360bbb-3(b)(1), unless the authorization is terminated  or revoked sooner.       Influenza A by PCR NEGATIVE NEGATIVE Final   Influenza B by PCR NEGATIVE NEGATIVE Final    Comment: (NOTE) The Xpert Xpress SARS-CoV-2/FLU/RSV plus assay is intended as an aid in the diagnosis of influenza from Nasopharyngeal swab specimens and should not be used as a sole basis for treatment. Nasal washings and aspirates are unacceptable for Xpert Xpress SARS-CoV-2/FLU/RSV testing.  Fact Sheet for Patients: EntrepreneurPulse.com.au  Fact Sheet for Healthcare Providers: IncredibleEmployment.be  This test is not yet approved or cleared by the [place] FDA and has been authorized for detection and/or diagnosis of SARS-CoV-2  by FDA under an Emergency Use Authorization (EUA). This EUA will remain in effect (meaning this test can be used) for the duration of the COVID-19 declaration under Section 564(b)(1) of the Act, 21 U.S.C. section 360bbb-3(b)(1), unless the authorization is terminated or revoked.  Performed at [facility], [address] [place], [place] [address]   Blood culture (routine x 2)     Status: None (Preliminary result)   Collection Time: 07/13/20 11:56 AM   Specimen: BLOOD  Result Value Ref Range Status   Specimen Description BLOOD PORTA CATH  Final   Special Requests   Final    BOTTLES DRAWN AEROBIC AND ANAEROBIC Blood Culture adequate volume   Culture   Final    NO GROWTH 3 DAYS Performed at [facility], [address], [place], [place] [address]    Report Status PENDING  Incomplete  Blood culture (routine x 2)     Status: None (Preliminary result)   Collection Time: 07/13/20 11:56 AM   Specimen: BLOOD  Result Value Ref Range Status   Specimen Description BLOOD LEFT ANTECUBITAL  Final   Special Requests   Final    BOTTLES DRAWN AEROBIC AND ANAEROBIC Blood Culture adequate volume   Culture   Final    NO GROWTH 3 DAYS Performed at [facility], [address] [place], [place] [address]    Report Status PENDING  Incomplete  Urine Culture     Status: None   Collection Time: 07/14/20  5:45 AM   Specimen: Urine, Random  Result Value Ref Range Status   Specimen Description   Final    URINE, RANDOM Performed at [facility], [address] [place], [place] [address]    Special Requests   Final    NONE Performed at [facility], [address] [place], [place] [address]    Culture   Final    NO GROWTH Performed at [facility], [place] [address] [place], [place] [address]    Report Status 07/15/2020 FINAL  Final         Radiology Studies: No results found.      Scheduled Meds: . acetaminophen-codeine  1  tablet Oral BID   And  . codeine  30 mg Oral BID  . aspirin EC  81 mg Oral Daily  . docusate sodium  100 mg Oral Daily  .  famotidine  20 mg Oral Daily  . ferrous sulfate  325 mg Oral Q breakfast  . FLUoxetine  20 mg Oral BID  . gabapentin  100 mg Oral QHS  . heparin  5,000 Units Subcutaneous Q8H  . insulin aspart  0-5 Units Subcutaneous QHS  . insulin aspart  0-9 Units Subcutaneous TID WC  . simvastatin  20 mg Oral QPM   Continuous Infusions: . ceFEPime (MAXIPIME) IV 2 g (07/16/20 1156)     LOS: 3 days    Time spent: 43 min    Desma Maxim, MD [facility]   If 7PM-7AM, please contact night-coverage [email or web address] Password [facility] 07/16/2020, 12:29 PM

## 2020-07-17 ENCOUNTER — Ambulatory Visit: Payer: Medicare HMO | Admitting: Physician Assistant

## 2020-07-17 LAB — BASIC METABOLIC PANEL
Anion gap: 11 (ref 5–15)
BUN: 93 mg/dL — ABNORMAL HIGH (ref 8–23)
CO2: 22 mmol/L (ref 22–32)
Calcium: 9 mg/dL (ref 8.9–10.3)
Chloride: 103 mmol/L (ref 98–111)
Creatinine, Ser: 5.27 mg/dL — ABNORMAL HIGH (ref 0.44–1.00)
GFR, Estimated: 9 mL/min — ABNORMAL LOW (ref >60)
Glucose, Bld: 135 mg/dL — ABNORMAL HIGH (ref 70–99)
Potassium: 5.5 mmol/L — ABNORMAL HIGH (ref 3.5–5.1)
Sodium: 136 mmol/L (ref 135–145)

## 2020-07-17 LAB — GLUCOSE, CAPILLARY
Glucose-Capillary: 123 mg/dL — ABNORMAL HIGH (ref 70–99)
Glucose-Capillary: 154 mg/dL — ABNORMAL HIGH (ref 70–99)
Glucose-Capillary: 165 mg/dL — ABNORMAL HIGH (ref 70–99)
Glucose-Capillary: 154 mg/dL — ABNORMAL HIGH (ref 70–99)
Glucose-Capillary: 143 mg/dL — ABNORMAL HIGH (ref 70–99)

## 2020-07-17 LAB — CBC
HCT: 24.8 % — ABNORMAL LOW (ref 36.0–46.0)
Hemoglobin: 8 g/dL — ABNORMAL LOW (ref 12.0–15.0)
MCH: 30.1 pg (ref 26.0–34.0)
MCHC: 32.3 g/dL (ref 30.0–36.0)
MCV: 93.2 fL (ref 80.0–100.0)
Platelets: 508 10*3/uL — ABNORMAL HIGH (ref 150–400)
RBC: 2.66 MIL/uL — ABNORMAL LOW (ref 3.87–5.11)
RDW: 16.8 % — ABNORMAL HIGH (ref 11.5–15.5)
WBC: 35 10*3/uL — ABNORMAL HIGH (ref 4.0–10.5)
nRBC: 0 % (ref 0.0–0.2)

## 2020-07-17 LAB — PARATHYROID HORMONE, INTACT (NO CA): PTH: 54 pg/mL (ref 15–65)

## 2020-07-17 LAB — GLUCOSE, RANDOM
Glucose, Bld: 121 mg/dL — ABNORMAL HIGH (ref 70–99)
Glucose, Bld: 174 mg/dL — ABNORMAL HIGH (ref 70–99)

## 2020-07-17 MED ORDER — PATIROMER SORBITEX CALCIUM 8.4 G PO PACK
16.8000 g | PACK | Freq: Every day | ORAL | Status: DC
  Administered 2020-07-17 – 2020-07-19 (×2): 16.8 g via ORAL
  Filled 2020-07-17 (×5): qty 2

## 2020-07-17 NOTE — Progress Notes (Signed)
[facility]  ROUNDING NOTE   Subjective:   Patient sitting on a recliner,appears comfortable.She denies worsening SOB, reports having' chills and feeling cold' this morning, which got resolved. Appetite is fair. No nausea or vomiting. She is requiring 3L supplemental O2 via nasal cannula.  Objective:  Vital signs in last 24 hours:  Temp:  [97.3 F (36.3 C)-98.5 F (36.9 C)] 97.6 F (36.4 C) (12/14 1154) Pulse Rate:  [70-78] 70 (12/14 1154) Resp:  [17-19] 19 (12/14 1154) BP: (86-165)/(56-86) 86/56 (12/14 1154) SpO2:  [88 %-100 %] 100 % (12/14 1154) Weight:  [82.2 kg] 82.2 kg (12/14 0421)  Weight change: -0.181 kg Filed Weights   07/15/20 0454 07/16/20 0511 07/17/20 0421  Weight: 85.2 kg 82.4 kg 82.2 kg    Intake/Output: I/O last 3 completed shifts: In: 84 [P.O.:300; I.V.:699] Out: 300 [Urine:300]   Intake/Output this shift:  Total I/O In: 240 [P.O.:240] Out: 300 [Urine:300]  Physical Exam: General: In no acute distress  Head: Oral mucosal membranes moist  Eyes: Sclerae and conjunctivae clear  Lungs:  lungs diminished at the bases, respirations slightly labored,on 3L O2  Heart: S1S2 , no rubs or gallops  Abdomen:  Soft, nontender, non distended  Extremities:  1+ peripheral edema  Neurologic: Oriented x 3  Skin: No acute lesions or rashes    Basic Metabolic Panel: Recent Labs  Lab 07/13/20 2335 07/14/20 0434 07/15/20 0656 07/16/20 0608 07/16/20 1250 07/16/20 1830 07/17/20 0011 07/17/20 0527 07/17/20 1300  NA 131* 129* 132* 135  --   --   --  136  --   K 5.0 5.0 4.6 4.9  --   --   --  5.5*  --   CL 93* 93* 98 101  --   --   --  103  --   CO2 25 25 22 22   --   --   --  22  --   GLUCOSE 155* 180* 67* 85 82 116* 121* 135* 174*  BUN 106* 102* 97* 95*  --   --   --  93*  --   CREATININE 6.07* 6.01* 5.75* 5.29*  --   --   --  5.27*  --   CALCIUM 8.4* 8.2* 8.5* 8.3*  --   --   --  9.0  --     Liver Function Tests: Recent Labs  Lab  07/13/20 0825  AST 14*  ALT 12  ALKPHOS 90  BILITOT 0.8  PROT 7.2  ALBUMIN 2.2*   No results for input(s): LIPASE, AMYLASE in the last 168 hours. No results for input(s): AMMONIA in the last 168 hours.  CBC: Recent Labs  Lab 07/13/20 0825 07/14/20 0434 07/15/20 0656 07/16/20 0608 07/17/20 0527  WBC 22.3* 22.0* 22.2* 24.3* 35.0*  NEUTROABS 19.8*  --   --   --   --   HGB 8.6* 7.7* 8.5* 7.3* 8.0*  HCT 25.0* 23.0* 25.3* 22.6* 24.8*  MCV 88.3 89.5 89.7 91.5 93.2  PLT 358 334 385 402* 508*    Cardiac Enzymes: No results for input(s): CKTOTAL, CKMB, CKMBINDEX, TROPONINI in the last 168 hours.  BNP: Invalid input(s): POCBNP  CBG: Recent Labs  Lab 07/16/20 1640 07/16/20 2047 07/17/20 0504 07/17/20 0747 07/17/20 1157  GLUCAP 105* 117* 123* 154* 165*    Microbiology: Results for orders placed or performed during the hospital encounter of 07/13/20  Resp Panel by RT-PCR (Flu A&B, Covid) Nasopharyngeal Swab     Status: None   Collection Time:  07/13/20 11:56 AM   Specimen: Nasopharyngeal Swab; Nasopharyngeal(NP) swabs in vial transport medium  Result Value Ref Range Status   SARS Coronavirus 2 by RT PCR NEGATIVE NEGATIVE Final    Comment: (NOTE) SARS-CoV-2 target nucleic acids are NOT DETECTED.  The SARS-CoV-2 RNA is generally detectable in upper respiratory specimens during the acute phase of infection. The lowest concentration of SARS-CoV-2 viral copies this assay can detect is 138 copies/mL. A negative result does not preclude SARS-Cov-2 infection and should not be used as the sole basis for treatment or other patient management decisions. A negative result may occur with  improper specimen collection/handling, submission of specimen other than nasopharyngeal swab, presence of viral mutation(s) within the areas targeted by this assay, and inadequate number of viral copies(<138 copies/mL). A negative result must be combined with clinical observations, patient  history, and epidemiological information. The expected result is Negative.  Fact Sheet for Patients:  EntrepreneurPulse.com.au  Fact Sheet for Healthcare Providers:  IncredibleEmployment.be  This test is no t yet approved or cleared by the [place] FDA and  has been authorized for detection and/or diagnosis of SARS-CoV-2 by FDA under an Emergency Use Authorization (EUA). This EUA will remain  in effect (meaning this test can be used) for the duration of the COVID-19 declaration under Section 564(b)(1) of the Act, 21 U.S.C.section 360bbb-3(b)(1), unless the authorization is terminated  or revoked sooner.       Influenza A by PCR NEGATIVE NEGATIVE Final   Influenza B by PCR NEGATIVE NEGATIVE Final    Comment: (NOTE) The Xpert Xpress SARS-CoV-2/FLU/RSV plus assay is intended as an aid in the diagnosis of influenza from Nasopharyngeal swab specimens and should not be used as a sole basis for treatment. Nasal washings and aspirates are unacceptable for Xpert Xpress SARS-CoV-2/FLU/RSV testing.  Fact Sheet for Patients: EntrepreneurPulse.com.au  Fact Sheet for Healthcare Providers: IncredibleEmployment.be  This test is not yet approved or cleared by the [place] FDA and has been authorized for detection and/or diagnosis of SARS-CoV-2 by FDA under an Emergency Use Authorization (EUA). This EUA will remain in effect (meaning this test can be used) for the duration of the COVID-19 declaration under Section 564(b)(1) of the Act, 21 U.S.C. section 360bbb-3(b)(1), unless the authorization is terminated or revoked.  Performed at [facility], [address] [place], [place] [address]   Blood culture (routine x 2)     Status: None (Preliminary result)   Collection Time: 07/13/20 11:56 AM   Specimen: BLOOD  Result Value Ref Range Status   Specimen Description BLOOD PORTA CATH  Final   Special  Requests   Final    BOTTLES DRAWN AEROBIC AND ANAEROBIC Blood Culture adequate volume   Culture   Final    NO GROWTH 4 DAYS Performed at [facility], [address] [place], [place] [address]    Report Status PENDING  Incomplete  Blood culture (routine x 2)     Status: None (Preliminary result)   Collection Time: 07/13/20 11:56 AM   Specimen: BLOOD  Result Value Ref Range Status   Specimen Description BLOOD LEFT ANTECUBITAL  Final   Special Requests   Final    BOTTLES DRAWN AEROBIC AND ANAEROBIC Blood Culture adequate volume   Culture   Final    NO GROWTH 4 DAYS Performed at [facility], [address], [place], [place] [address]    Report Status PENDING  Incomplete  Urine Culture     Status: None  Collection Time: 07/14/20  5:45 AM   Specimen: Urine, Random  Result Value Ref Range Status   Specimen Description   Final    URINE, RANDOM Performed at [facility], [address] [place], [place] [address]    Special Requests   Final    NONE Performed at [facility], [address] [place], [place] [address]    Culture   Final    NO GROWTH Performed at [facility], [place] [address] [place], [place] [address]    Report Status 07/15/2020 FINAL  Final    Coagulation Studies: No results for input(s): LABPROT, INR in the last 72 hours.  Urinalysis: No results for input(s): COLORURINE, LABSPEC, PHURINE, GLUCOSEU, HGBUR, BILIRUBINUR, KETONESUR, PROTEINUR, UROBILINOGEN, NITRITE, LEUKOCYTESUR in the last 72 hours.  Invalid input(s): APPERANCEUR    Imaging: No results found.   Medications:   . ceFEPime (MAXIPIME) IV 2 g (07/17/20 1237)   . acetaminophen-codeine  1 tablet Oral BID   And  . codeine  30 mg Oral BID  . aspirin EC  81 mg Oral Daily  . docusate sodium  100 mg Oral Daily  . famotidine  20 mg Oral Daily  . ferrous sulfate  325 mg Oral Q breakfast  . FLUoxetine  20 mg Oral BID  . gabapentin  100 mg Oral QHS   . heparin  5,000 Units Subcutaneous Q8H  . patiromer  16.8 g Oral Daily  . simvastatin  20 mg Oral QPM   albuterol, ALPRAZolam, ondansetron **OR** ondansetron (ZOFRAN) IV, senna  Assessment/ Plan:  [name] is a 64 y.o. black female with stage IV breast cancer, diabetes mellitus type II insulin dependent, asthma, CVA with left residual weakness, congestive heart failure, depression, hypertension, hyperlipidemia who was admitted to [facility] on 07/13/2020 for Cough [R05.9] Cancer ([place]) [C80.1] Chronic respiratory failure with hypoxia ([place]) [J96.11] AKI (acute kidney injury) ([place]) [N17.9] S/P amputation of lesser toe, left ([facility]) [Z89.422] Leukocytosis, unspecified type [D72.829] Sepsis, due to unspecified organism, unspecified whether acute organ dysfunction present ([place]) [A41.9]  1. Acute Kidney Injury on chronic kidney disease stage IV with proteinuria: baseline creatinine of  2.44, GFR of 22 on 07/02/2020.  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   Lab Results  Component Value Date   CREATININE 5.27 (H) 07/17/2020   CREATININE 5.29 (H) 07/16/2020   CREATININE 5.75 (H) 07/15/2020  Renal function stays relatively stable  Dr.Kolluru discussed briefly with the patient regarding goals of care Patient not interested in dialysis   2. Hypertension: Blood pressure readings stay within low normal range Will continue monitoring closely Antihypertensive medications on hold  3. Anemia of chronic kidney disease Lab Results  Component Value Date   HGB 8.0 (L) 07/17/2020  Patient is on PO iron supplements  4. Secondary Hyperparathyroidism Lab Results  Component Value Date   PTH 54 07/16/2020   CALCIUM 9.0 07/17/2020   PHOS 2.9 04/04/2020  No acute indication for Phos binders  5.Hyperkalemia  Potassium 5.5  We will start her on [place] daily dose    LOS: 4 [name] 12/14/20213:03 PM

## 2020-07-17 NOTE — Progress Notes (Signed)
OT Cancellation Note  Patient Details Name: [name] MRN: [id number] DOB: 1956-05-27   Cancelled Treatment:    Reason Eval/Treat Not Completed: Other (comment). Pt with elevated K+ of 5.5 which is contraindicated for OT intervention. OT will follow up once pt is appropriate.   Darleen Crocker, MS, OTR/L , CBIS ascom [phone]  07/17/20, 8:41 AM   07/17/2020, 8:40 AM

## 2020-07-17 NOTE — Progress Notes (Signed)
PT Cancellation Note  Patient Details Name: [name] MRN: [id number] DOB: Nov 23, 1955   Cancelled Treatment:    Reason Eval/Treat Not Completed: Medical issues which prohibited therapy. Consult received and chart reviewed. Patient noted with critically elevated K+ (5.5), per PT practice guidelines pt is contraindicated for exertional activity at this time.  Will continue efforts next date pending medical stability and appropriateness.  Lieutenant Diego PT, DPT 8:36 AM,07/17/20

## 2020-07-17 NOTE — Progress Notes (Signed)
[name]   DOB:[birth year]   [id number]    Subjective: No acute events overnight.  Patient continues to feel poorly.  Poor appetite.  No fevers.  Objective:  Vitals:   07/17/20 1524 07/17/20 1951  BP: 103/71 106/64  Pulse: 72 74  Resp: 19 18  Temp: (!) 97.3 F (36.3 C) 97.7 F (36.5 C)  SpO2: 100% 100%     Intake/Output Summary (Last 24 hours) at 07/17/2020 2206 Last data filed at 07/17/2020 1900 Gross per 24 hour  Intake 1380.76 ml  Output 525 ml  Net 855.76 ml    Physical Exam Constitutional:      Comments: [name] African-American female patient.  She is resting in the chair.  Accompanied by her son.  HENT:     Head: Normocephalic and atraumatic.     Mouth/Throat:     Mouth: Oropharynx is clear and moist.     Pharynx: No oropharyngeal exudate.  Eyes:     Pupils: Pupils are equal, round, and reactive to light.  Cardiovascular:     Rate and Rhythm: Normal rate and regular rhythm.  Pulmonary:     Effort: No respiratory distress.     Breath sounds: No wheezing.     Comments: Decreased air entry bilaterally. Abdominal:     General: Bowel sounds are normal. There is no distension.     Palpations: Abdomen is soft. There is no mass.     Tenderness: There is no abdominal tenderness. There is no guarding or rebound.  Musculoskeletal:        General: No tenderness or edema. Normal range of motion.     Cervical back: Normal range of motion and neck supple.     Comments: Bilateral lower extremity edema/  Skin:    General: Skin is warm.  Neurological:     Mental Status: She is alert and oriented to person, place, and time.  Psychiatric:        Mood and Affect: Affect normal.      Labs:  Lab Results  Component Value Date   WBC 35.0 (H) 07/17/2020   HGB 8.0 (L) 07/17/2020   HCT 24.8 (L) 07/17/2020   MCV 93.2 07/17/2020   PLT 508 (H) 07/17/2020   NEUTROABS 19.8 (H) 07/13/2020    Lab Results  Component Value Date   NA 136 07/17/2020   K 5.5 (H)  07/17/2020   CL 103 07/17/2020   CO2 22 07/17/2020    Studies:  No results found.  Carcinoma of upper-inner quadrant of left breast in female, estrogen receptor positive ([place]) 64 year old female patient with a history of metastatic/stage IV breast cancer multiple other medical problems including chronic kidney disease stage IV, poorly controlled diabetes; diabetic foot ulcer-is currently admitted to hospital for hypotension/leukocytosis and acute renal failure.  # Stage IV breast cancer ER/PR positive HER-2 negative - October 30 CT scan-progression of disease-pleural metastases/bilateral effusion; worsening lytic lesions in the bones;  Currently on weekly DOXIL #3 so far.  See below  #Acute on chronic kidney disease-creatinine today 5.5 slightly improved from 6.5 on admission.  Potassium 5.5.  Currently on lokelma.  Discussed with nephrology patient poor candidate for dialysis.  #Leukocytosis/hypotension systolic 28B blood pressure/acute on chronic renal failure-prerenal/overdiuresis versus others.  Clinically stable however leukocytosis rising 34,000.  Question occult infection/especially given the immunocompromise state-patient currently on cefepime.  # Anemia- sec to chemo-CKD- IV-in general hemoglobin around 7-8.  If tomorrow hemoglobin less than 8 recommend PRBC transfusion  #DNR/DNI/patient understands continued poor  prognosis.  Met with patient and her son along with Josh with palliative care.  Discussed that given the current course of illness patient's life expectancy is in order of days to weeks.  Discussed regarding hospice.  Ideally patient would be suited for care at hospice home.  However patient declines hospice home. Discussed that with would recommended for the family wanting to visit patient at this time.  I think is reasonable to continue current scope of care with IV fluids/antibiotics for the next few days to see if she continues to improve.  Ultimately patient will benefit  from hospice at discharge.  Discussed with Dr. Juleen China.  Discussed with Dr.Wouk.  Appreciate input from Dr. Regenia Skeeter.  # 40 minutes face-to-face with the patient discussing the above plan of care; more than 50% of time spent on prognosis/ natural history; counseling and coordination.  Cammie Sickle, MD 07/17/2020  10:06 PM

## 2020-07-17 NOTE — Progress Notes (Addendum)
PROGRESS NOTE    [name]  MWU:[id number] DOB: 1956-01-14 DOA: 07/13/2020 PCP: [name], MD  Outpatient Specialists: nephrology, oncology    Brief Narrative:   [name] is a 65 y.o. female with medical history significant forstage IV breast cancer on chemotherapy, stage IV chronic kidney disease, depression, chronic respiratory failure, hypertension, GERD who was sent to the emergency room from the cancer center for evaluation of abnormal labs.  Patient had gone to the cancer center for chemotherapy and labs drawn showed worsening of her renal function from baseline.  At baseline she has a serum creatinine of 2.4 but today on admission her serum creatinine was 6.5.  Patient states that she cut back on her fluid intake because she recently had thoracentesis done in the hospital with drainage of about a liter of fluid from both lungs.  She states that her mouth stays dry but denies having any urinary frequency or nocturia.  She denies feeling dizzy or lightheaded and had a blood pressure in the 80s at the cancer center for which she received 1 L of IV fluid. She has a cough productive of clear phlegm. She denies having any fever or chills, no abdominal pain, no chest pain, no nausea, no vomiting, no diaphoresis or palpitations.   Assessment & Plan:   Principal Problem:   AKI (acute kidney injury) ([place]) Active Problems:   Benign essential HTN   Anxiety and depression   Type 2 diabetes mellitus with diabetic chronic kidney disease ([place])   Anemia of chronic disease   Metastatic breast cancer ([facility])   Antineoplastic chemotherapy induced anemia   Weakness generalized   Malignant pleural effusion   Leukocytosis   Hyponatremia   Pressure injury of skin   # Acute kidney injury on ckd stage 4 # Hyperkalemia Prerenal, possible ATN, likely 2/2 purposeful decreased PO (pt was hoping to avoid reaccumulation of pleural effusions) and diuretic use. At baseline patient has  stage IV chronic kidney disease with a serum creatinine of 2.4 and on admission it is 6.5 with a normal serum bicarbonate level and upper level of normal K.. No symptoms of infection and w/u thus far negative. Today Cr maintains in the 5s, k is 4.4. UOP yesterday not recorded. Renal u/s 12/10 w/o hydro, kidneys w/ signs medical renal disease. No indication for dialysis and not recommended given terminal dx. - continue to hold home bumex and home bp meds  - s/p 1.5 L bolus, now on d5 1/2 ns @ 50 - trend kidney function - veltassa started for hyperkalemia - nephrology consulted, recs appreciated  # Metastatic breast cancer # Malignant pleural effusions Patient with known stage IV breast cancer on palliative chemotherapy doxil weekly CT scan of the chest without contrast showsbilateral malignant pleural effusions, which are only slightly larger than the comparison PET CT of 03/28/2020, with associated evidence of visceral/parietal pleural metastases. Re demonstrationofskeletal metastases, with new/enlarging lytic lesions of the thoracic spine compatible with progression. The left breast mass appears to be enlarging compared to the prior PET. Patient is status post thoracentesis about 4 weeks ago with drainage of about 1 L of pleural fluid. Respiratory status stable. Oncology following, recs appreciated. Overall prognosis is poor - onc palliative has seen, family meeting today with oncology. Per dr. Jacinto Reap, "we discussed hospice.. but decided to give few more days to see how she does; and in mean time family from out of town come to see her.. as she does not have much time. so,  for now.. just doing what we hav ebeen doing.. " - holding on repeat thoracentesis as respiratory status appears to be at baseline  # Chronic hypoxic respiratory failure On 4 L at home, stable here. 2/2 metastatic breast cancer and malignant pleural effusions - cont O2  # Leukocytosis # Recent left foot osteomyelitis Here with  hypotension (resolved but pressures remain soft) and AKI likely 2/2 dehydration. Recent admission for left foot osteo s/p partial ray amputation and IV abx. Has been followed by wound, reported to be healing. No signs of worsening infection today. CXR without obvious PNA and patient says breathing is stable. Lactate wnl. No fevers, no dysuria. No abd pain or n/v/f. Received vanc/cefepime 12/10.  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 H stable 8 today - monitor - cont home iron  # Hypertension Here hypotensive on arrive, now normotensive - holding home bumex, amlodipine, atenolol, clonidine, enalapril   # Depression and anxiety - Continue alprazolam and fluoxetine  # Chronic pain - home gabapentin  # Hx CVA - cont home aspirin, statin  # Constipation - cont home colace, senna  # History of diastolic dysfunction CHF - Hold Bumex  # Stage 2 sacral decubitus ulcer - POA # Left foot ulcer Foot ulcer doesn't appear infected. - Wound care consulted   DVT prophylaxis: heparin Code Status: DNR Family Communication: none @ bedside  Status is: Inpatient  Remains inpatient appropriate because:Inpatient level of care appropriate due to severity of illness   Dispo: The patient is from: Home              Anticipated d/c is to: TBD, poss home w/ home hospice              Anticipated d/c date is: 3-5 days              Patient currently is not medically stable to d/c.        Consultants:  Oncology, nephrology  Procedures: none  Antimicrobials:  Vanc/cefepime  12/10  Cefepime 12/13>   Subjective: No nausea, little appetite. Denies fevers or cough. Feels fatigued  Objective: Vitals:   07/17/20 0538 07/17/20 0721 07/17/20 0726 07/17/20 1154  BP: 113/67 (!) 89/56 (!) 93/59 (!) 86/56  Pulse:  78 77 70  Resp:  19  19  Temp: 98.1 F (36.7 C) 97.9 F (36.6 C)  97.6 F (36.4 C)  TempSrc: Oral Oral  Oral  SpO2: 96% 98%  100%  Weight:      Height:        Intake/Output Summary (Last 24 hours) at 07/17/2020 1335 Last data filed at 07/17/2020 0900 Gross per 24 hour  Intake 1239.01 ml  Output --  Net 1239.01 ml   Filed Weights   07/15/20 0454 07/16/20 0511 07/17/20 0421  Weight: 85.2 kg 82.4 kg 82.2 kg    Examination:  General exam: Appears calm and comfortable  Respiratory system: decreased breath sounds at bases, scattered rhonchi Cardiovascular system: S1 & S2 heard, rrr, soft systolic murmur Gastrointestinal system: Abdomen is nondistended, soft and nontender. No organomegaly or masses felt. Normal bowel sounds heard. Central nervous  system: Alert and oriented. No focal neurological deficits. Extremities: moving all 4 equally Skin: 1+ LE edema, heels bandaged Psychiatry: Judgement and insight appear normal. Mood & affect appropriate.   Lines: Right subclavian port  Data Reviewed: I have personally reviewed following labs and imaging studies  CBC: Recent Labs  Lab 07/13/20 0825 07/14/20 0434 07/15/20 0656 07/16/20 0608 07/17/20 0527  WBC 22.3* 22.0* 22.2* 24.3* 35.0*  NEUTROABS 19.8*  --   --   --   --   HGB 8.6* 7.7* 8.5* 7.3* 8.0*  HCT 25.0* 23.0* 25.3* 22.6* 24.8*  MCV 88.3 89.5 89.7 91.5 93.2  PLT 358 334 385 402* 627*   Basic Metabolic Panel: Recent Labs  Lab 07/13/20 2335 07/14/20 0434 07/15/20 0656 07/16/20 0608 07/16/20 1250 07/16/20 1830 07/17/20 0011 07/17/20 0527 07/17/20 1300  NA 131* 129* 132* 135  --   --   --  136  --   K 5.0 5.0 4.6 4.9  --   --   --  5.5*  --   CL 93* 93* 98 101  --   --    --  103  --   CO2 25 25 22 22   --   --   --  22  --   GLUCOSE 155* 180* 67* 85 82 116* 121* 135* 174*  BUN 106* 102* 97* 95*  --   --   --  93*  --   CREATININE 6.07* 6.01* 5.75* 5.29*  --   --   --  5.27*  --   CALCIUM 8.4* 8.2* 8.5* 8.3*  --   --   --  9.0  --    GFR: Estimated Creatinine Clearance: 10.8 mL/min (A) (by C-G formula based on SCr of 5.27 mg/dL (H)). Liver Function Tests: Recent Labs  Lab 07/13/20 0825  AST 14*  ALT 12  ALKPHOS 90  BILITOT 0.8  PROT 7.2  ALBUMIN 2.2*   No results for input(s): LIPASE, AMYLASE in the last 168 hours. No results for input(s): AMMONIA in the last 168 hours. Coagulation Profile: Recent Labs  Lab 07/14/20 0542  INR 1.3*   Cardiac Enzymes: No results for input(s): CKTOTAL, CKMB, CKMBINDEX, TROPONINI in the last 168 hours. BNP (last 3 results) No results for input(s): PROBNP in the last 8760 hours. HbA1C: No results for input(s): HGBA1C in the last 72 hours. CBG: Recent Labs  Lab 07/16/20 1640 07/16/20 2047 07/17/20 0504 07/17/20 0747 07/17/20 1157  GLUCAP 105* 117* 123* 154* 165*   Lipid Profile: No results for input(s): CHOL, HDL, LDLCALC, TRIG, CHOLHDL, LDLDIRECT in the last 72 hours. Thyroid Function Tests: No results for input(s): TSH, T4TOTAL, FREET4, T3FREE, THYROIDAB in the last 72 hours. Anemia Panel: No results for input(s): VITAMINB12, FOLATE, FERRITIN, TIBC, IRON, RETICCTPCT in the last 72 hours. Urine analysis:    Component Value Date/Time   COLORURINE YELLOW (A) 07/14/2020 0545   APPEARANCEUR HAZY (A) 07/14/2020 0545   LABSPEC 1.012 07/14/2020 0545   PHURINE 5.0 07/14/2020 0545   GLUCOSEU 50 (A) 07/14/2020 0545   HGBUR NEGATIVE 07/14/2020 0545   [place] 07/14/2020 0545   [place] 07/14/2020 0545   PROTEINUR 30 (A) 07/14/2020 0545   NITRITE NEGATIVE 07/14/2020 0545   LEUKOCYTESUR NEGATIVE 07/14/2020 0545   Sepsis Labs: @LABRCNTIP (procalcitonin:4,lacticidven:4)  ) Recent  Results (from the past 240 hour(s))  Resp Panel by RT-PCR (Flu A&B, Covid) Nasopharyngeal Swab     Status: None   Collection Time: 07/13/20 11:56 AM  Specimen: Nasopharyngeal Swab; Nasopharyngeal(NP) swabs in vial transport medium  Result Value Ref Range Status   SARS Coronavirus 2 by RT PCR NEGATIVE NEGATIVE Final    Comment: (NOTE) SARS-CoV-2 target nucleic acids are NOT DETECTED.  The SARS-CoV-2 RNA is generally detectable in upper respiratory specimens during the acute phase of infection. The lowest concentration of SARS-CoV-2 viral copies this assay can detect is 138 copies/mL. A negative result does not preclude SARS-Cov-2 infection and should not be used as the sole basis for treatment or other patient management decisions. A negative result may occur with  improper specimen collection/handling, submission of specimen other than nasopharyngeal swab, presence of viral mutation(s) within the areas targeted by this assay, and inadequate number of viral copies(<138 copies/mL). A negative result must be combined with clinical observations, patient history, and epidemiological information. The expected result is Negative.  Fact Sheet for Patients:  EntrepreneurPulse.com.au  Fact Sheet for Healthcare Providers:  IncredibleEmployment.be  This test is no t yet approved or cleared by the [place] FDA and  has been authorized for detection and/or diagnosis of SARS-CoV-2 by FDA under an Emergency Use Authorization (EUA). This EUA will remain  in effect (meaning this test can be used) for the duration of the COVID-19 declaration under Section 564(b)(1) of the Act, 21 U.S.C.section 360bbb-3(b)(1), unless the authorization is terminated  or revoked sooner.       Influenza A by PCR NEGATIVE NEGATIVE Final   Influenza B by PCR NEGATIVE NEGATIVE Final    Comment: (NOTE) The Xpert Xpress SARS-CoV-2/FLU/RSV plus assay is intended as an aid in the  diagnosis of influenza from Nasopharyngeal swab specimens and should not be used as a sole basis for treatment. Nasal washings and aspirates are unacceptable for Xpert Xpress SARS-CoV-2/FLU/RSV testing.  Fact Sheet for Patients: EntrepreneurPulse.com.au  Fact Sheet for Healthcare Providers: IncredibleEmployment.be  This test is not yet approved or cleared by the [place] FDA and has been authorized for detection and/or diagnosis of SARS-CoV-2 by FDA under an Emergency Use Authorization (EUA). This EUA will remain in effect (meaning this test can be used) for the duration of the COVID-19 declaration under Section 564(b)(1) of the Act, 21 U.S.C. section 360bbb-3(b)(1), unless the authorization is terminated or revoked.  Performed at [facility], [address] [place], [place] [address]   Blood culture (routine x 2)     Status: None (Preliminary result)   Collection Time: 07/13/20 11:56 AM   Specimen: BLOOD  Result Value Ref Range Status   Specimen Description BLOOD PORTA CATH  Final   Special Requests   Final    BOTTLES DRAWN AEROBIC AND ANAEROBIC Blood Culture adequate volume   Culture   Final    NO GROWTH 4 DAYS Performed at [facility], [address], [place], [place] [address]    Report Status PENDING  Incomplete  Blood culture (routine x 2)     Status: None (Preliminary result)   Collection Time: 07/13/20 11:56 AM   Specimen: BLOOD  Result Value Ref Range Status   Specimen Description BLOOD LEFT ANTECUBITAL  Final   Special Requests   Final    BOTTLES DRAWN AEROBIC AND ANAEROBIC Blood Culture adequate volume   Culture   Final    NO GROWTH 4 DAYS Performed at [facility], [address] [place], [place] [address]    Report Status PENDING  Incomplete  Urine Culture     Status: None   Collection Time: 07/14/20  5:45 AM  Specimen: Urine, Random  Result Value Ref Range Status   Specimen  Description   Final    URINE, RANDOM Performed at [facility], [address], [place], [place] [address]    Special Requests   Final    NONE Performed at [facility], [address], [place], [place] [address]    Culture   Final    NO GROWTH Performed at [facility], [place] [address], [place], [place] [address]    Report Status 07/15/2020 FINAL  Final         Radiology Studies: No results found.      Scheduled Meds: . acetaminophen-codeine  1 tablet Oral BID   And  . codeine  30 mg Oral BID  . aspirin EC  81 mg Oral Daily  . docusate sodium  100 mg Oral Daily  . famotidine  20 mg Oral Daily  . ferrous sulfate  325 mg Oral Q breakfast  . FLUoxetine  20 mg Oral BID  . gabapentin  100 mg Oral QHS  . heparin  5,000 Units Subcutaneous Q8H  . patiromer  16.8 g Oral Daily  . simvastatin  20 mg Oral QPM   Continuous Infusions: . ceFEPime (MAXIPIME) IV 2 g (07/17/20 1237)     LOS: 4 days    Time spent: 32 min    Desma Maxim, MD [facility]   If 7PM-7AM, please contact night-coverage [email or web address] Password [email or web address] 07/17/2020, 1:35 PM

## 2020-07-17 NOTE — Progress Notes (Addendum)
Patient called this nurse and reported feeling really shaky and cold. Patient blood sugar taken resulted in 123. Patient appears short of breath. Vitals show elevated b/p( 165/86)decreased saturations on 4L, (86%) HR remains in the 70s. Resp 30-34bpm. Tachypneic.  Patient given xanax for anxiety. Patient is also reassured and rest and breathing exercises encouraged.   Post xanax b/p 113/67, HR 81, temp 98.1 oral, 96% on 5L. Will wean down and continue to monitor. Patient says that she feels a little better. Repositied in bed. Also have 2 warm blankets placed. Monitoring vitals q 63mins  0556-Patient does not want her dressing changed on her left foot at this time.   0616- Patient's b/p went down to 83/62, Paged NP of patient's status. Made aware, No new orders

## 2020-07-18 DIAGNOSIS — T451X5A Adverse effect of antineoplastic and immunosuppressive drugs, initial encounter: Secondary | ICD-10-CM

## 2020-07-18 DIAGNOSIS — D6481 Anemia due to antineoplastic chemotherapy: Secondary | ICD-10-CM

## 2020-07-18 DIAGNOSIS — C50919 Malignant neoplasm of unspecified site of unspecified female breast: Secondary | ICD-10-CM

## 2020-07-18 DIAGNOSIS — R531 Weakness: Secondary | ICD-10-CM

## 2020-07-18 LAB — CULTURE, BLOOD (ROUTINE X 2)
Culture: NO GROWTH
Culture: NO GROWTH
Special Requests: ADEQUATE
Special Requests: ADEQUATE

## 2020-07-18 LAB — QUANTIFERON-TB GOLD PLUS (RQFGPL)
QuantiFERON Mitogen Value: 0.46 IU/mL
QuantiFERON Nil Value: 0 IU/mL
QuantiFERON TB1 Ag Value: 0 IU/mL
QuantiFERON TB2 Ag Value: 0 IU/mL

## 2020-07-18 LAB — GLUCOSE, CAPILLARY
Glucose-Capillary: 76 mg/dL (ref 70–99)
Glucose-Capillary: 60 mg/dL — ABNORMAL LOW (ref 70–99)
Glucose-Capillary: 64 mg/dL — ABNORMAL LOW (ref 70–99)
Glucose-Capillary: 128 mg/dL — ABNORMAL HIGH (ref 70–99)
Glucose-Capillary: 100 mg/dL — ABNORMAL HIGH (ref 70–99)
Glucose-Capillary: 79 mg/dL (ref 70–99)

## 2020-07-18 LAB — BASIC METABOLIC PANEL
Anion gap: 12 (ref 5–15)
BUN: 98 mg/dL — ABNORMAL HIGH (ref 8–23)
CO2: 22 mmol/L (ref 22–32)
Calcium: 8.6 mg/dL — ABNORMAL LOW (ref 8.9–10.3)
Chloride: 102 mmol/L (ref 98–111)
Creatinine, Ser: 5.4 mg/dL — ABNORMAL HIGH (ref 0.44–1.00)
GFR, Estimated: 8 mL/min — ABNORMAL LOW (ref >60)
Glucose, Bld: 95 mg/dL (ref 70–99)
Potassium: 5.2 mmol/L — ABNORMAL HIGH (ref 3.5–5.1)
Sodium: 136 mmol/L (ref 135–145)

## 2020-07-18 LAB — CBC
HCT: 19.9 % — ABNORMAL LOW (ref 36.0–46.0)
Hemoglobin: 6.5 g/dL — ABNORMAL LOW (ref 12.0–15.0)
MCH: 30 pg (ref 26.0–34.0)
MCHC: 32.7 g/dL (ref 30.0–36.0)
MCV: 91.7 fL (ref 80.0–100.0)
Platelets: 403 10*3/uL — ABNORMAL HIGH (ref 150–400)
RBC: 2.17 MIL/uL — ABNORMAL LOW (ref 3.87–5.11)
RDW: 17.1 % — ABNORMAL HIGH (ref 11.5–15.5)
WBC: 23.5 10*3/uL — ABNORMAL HIGH (ref 4.0–10.5)
nRBC: 0 % (ref 0.0–0.2)

## 2020-07-18 LAB — PREPARE RBC (CROSSMATCH)

## 2020-07-18 LAB — QUANTIFERON-TB GOLD PLUS: QuantiFERON-TB Gold Plus: UNDETERMINED — AB

## 2020-07-18 MED ORDER — ACETAMINOPHEN 325 MG PO TABS
650.0000 mg | ORAL_TABLET | Freq: Once | ORAL | Status: AC
  Administered 2020-07-18: 21:00:00 650 mg via ORAL
  Filled 2020-07-18: qty 2

## 2020-07-18 MED ORDER — HEPARIN SOD (PORK) LOCK FLUSH 100 UNIT/ML IV SOLN
500.0000 [IU] | Freq: Every day | INTRAVENOUS | Status: DC | PRN
  Filled 2020-07-18: qty 5

## 2020-07-18 MED ORDER — DIPHENHYDRAMINE HCL 25 MG PO CAPS
25.0000 mg | ORAL_CAPSULE | Freq: Once | ORAL | Status: AC
  Administered 2020-07-18: 21:00:00 25 mg via ORAL
  Filled 2020-07-18: qty 1

## 2020-07-18 MED ORDER — SODIUM CHLORIDE 0.9% FLUSH: 10.0000 mL | INTRAVENOUS | Status: DC | PRN

## 2020-07-18 MED ORDER — DEXTROSE 50 % IV SOLN
INTRAVENOUS | Status: AC
  Administered 2020-07-18: 13:00:00 50 mL
  Filled 2020-07-18: qty 50

## 2020-07-18 MED ORDER — SODIUM CHLORIDE 0.9% IV SOLUTION
250.0000 mL | Freq: Once | INTRAVENOUS | Status: AC
  Administered 2020-07-18: 13:00:00 250 mL via INTRAVENOUS

## 2020-07-18 MED ORDER — HEPARIN SOD (PORK) LOCK FLUSH 100 UNIT/ML IV SOLN
250.0000 [IU] | INTRAVENOUS | Status: DC | PRN
  Filled 2020-07-18: qty 5

## 2020-07-18 MED ORDER — SODIUM CHLORIDE 0.9% FLUSH: 3.0000 mL | INTRAVENOUS | Status: DC | PRN

## 2020-07-18 NOTE — Hospital Course (Signed)
From note of 12/14 by Dr. Si Raider: "[name] is a 63 y.o. female with medical history significant for stage IV breast cancer on chemotherapy, stage IV chronic kidney disease, depression, chronic respiratory failure, hypertension, GERD who was sent to the emergency room from the cancer center for evaluation of abnormal labs.  Patient had gone to the cancer center for chemotherapy and labs drawn showed worsening of her renal function from baseline.  At baseline she has a serum creatinine of 2.4 but today on admission her serum creatinine was 6.5.  Patient states that she cut back on her fluid intake because she recently had thoracentesis done in the hospital with drainage of about a liter of fluid from both lungs.  She states that her mouth stays dry but denies having any urinary frequency or nocturia.  She denies feeling dizzy or lightheaded and had a blood pressure in the 80s at the cancer center for which she received 1 L of IV fluid. She has a cough productive of clear phlegm. She denies having any fever or chills, no abdominal pain, no chest pain, no nausea, no vomiting, no diaphoresis or palpitations."

## 2020-07-18 NOTE — Progress Notes (Signed)
Patients blood not prepared yet in blood bank follow up with blood bank d/t pt being oncology pt blood takes longer to prepare.

## 2020-07-18 NOTE — Progress Notes (Signed)
OT Cancellation Note  Patient Details Name: [name] MRN: [id number] DOB: Nov 29, 1955   Cancelled Treatment:    Reason Eval/Treat Not Completed: Medical issues which prohibited therapy. Pt with elevated K+ of 5.2 which is contraindicated for OT intervention. OT will follow up once pt is appropriate.   Josiah Lobo, PhD, [place], OTR/L ascom [phone] 07/18/20, 8:44 AM

## 2020-07-18 NOTE — Progress Notes (Signed)
PT Cancellation Note  Patient Details Name: [name] MRN: [id number] DOB: 1956-02-04   Cancelled Treatment:    Reason Eval/Treat Not Completed: Medical issues which prohibited therapy. Consult received and chart reviewed. Patient noted with elevated K+ (5.2) as well as low hemoglobin (6.5), per PT practice guidelines, pt is contraindicated for exertional activity at this time.  Will continue efforts next date pending medical stability and appropriateness.  Lieutenant Diego PT, DPT [phone] PM,07/18/20

## 2020-07-18 NOTE — Progress Notes (Signed)
[facility]  ROUNDING NOTE   Subjective:   Patient appears in no acute distress. She reports 'feeling OK'. She had a family meeting with palliative care yesterday. She expresses her wishes that she does not want any aggressive treatments like dialysis. She wants to 'spend rest of my time peacefully with family', likes to continue current medical management.  Objective:  Vital signs in last 24 hours:  Temp:  [97.3 F (36.3 C)-98.8 F (37.1 C)] 97.5 F (36.4 C) (12/15 1144) Pulse Rate:  [69-74] 69 (12/15 1144) Resp:  [17-19] 17 (12/15 0405) BP: (98-106)/(60-77) 98/77 (12/15 1144) SpO2:  [98 %-100 %] 100 % (12/15 1144) Weight:  [84.9 kg] 84.9 kg (12/15 0405)  Weight change: 2.631 kg Filed Weights   07/16/20 0511 07/17/20 0421 07/18/20 0405  Weight: 82.4 kg 82.2 kg 84.9 kg    Intake/Output: I/O last 3 completed shifts: In: 1688.1 [P.O.:780; I.V.:699; IV Piggyback:209.1] Out: 825 [Urine:825]   Intake/Output this shift:  No intake/output data recorded.  Physical Exam: General: Sitting up in a recliner, in no acute distress  Head: Normocephalic,atraumatic  Eyes: Anicteric  Lungs:  Respirations slightly labored,lungs diminished at the bases  Heart: Regular  Abdomen:  Soft, nontender, non distended  Extremities:  2+ peripheral edema  Neurologic: Awake, alert, oriented  Skin: No acute lesions or rashes    Basic Metabolic Panel: Recent Labs  Lab 07/14/20 0434 07/15/20 0656 07/16/20 0608 07/16/20 1250 07/16/20 1830 07/17/20 0011 07/17/20 0527 07/17/20 1300 07/18/20 0524  NA 129* 132* 135  --   --   --  136  --  136  K 5.0 4.6 4.9  --   --   --  5.5*  --  5.2*  CL 93* 98 101  --   --   --  103  --  102  CO2 25 22 22   --   --   --  22  --  22  GLUCOSE 180* 67* 85   < > 116* 121* 135* 174* 95  BUN 102* 97* 95*  --   --   --  93*  --  98*  CREATININE 6.01* 5.75* 5.29*  --   --   --  5.27*  --  5.40*  CALCIUM 8.2* 8.5* 8.3*  --   --   --  9.0  --  8.6*    < > = values in this interval not displayed.    Liver Function Tests: Recent Labs  Lab 07/13/20 0825  AST 14*  ALT 12  ALKPHOS 90  BILITOT 0.8  PROT 7.2  ALBUMIN 2.2*   No results for input(s): LIPASE, AMYLASE in the last 168 hours. No results for input(s): AMMONIA in the last 168 hours.  CBC: Recent Labs  Lab 07/13/20 0825 07/14/20 0434 07/15/20 0656 07/16/20 0608 07/17/20 0527 07/18/20 0524  WBC 22.3* 22.0* 22.2* 24.3* 35.0* 23.5*  NEUTROABS 19.8*  --   --   --   --   --   HGB 8.6* 7.7* 8.5* 7.3* 8.0* 6.5*  HCT 25.0* 23.0* 25.3* 22.6* 24.8* 19.9*  MCV 88.3 89.5 89.7 91.5 93.2 91.7  PLT 358 334 385 402* 508* 403*    Cardiac Enzymes: No results for input(s): CKTOTAL, CKMB, CKMBINDEX, TROPONINI in the last 168 hours.  BNP: Invalid input(s): POCBNP  CBG: Recent Labs  Lab 07/17/20 1952 07/18/20 0805 07/18/20 1146 07/18/20 1216 07/18/20 1258  GLUCAP 143* 76 64* 60* 128*    Microbiology: Results for orders placed or  performed during the hospital encounter of 07/13/20  Resp Panel by RT-PCR (Flu A&B, Covid) Nasopharyngeal Swab     Status: None   Collection Time: 07/13/20 11:56 AM   Specimen: Nasopharyngeal Swab; Nasopharyngeal(NP) swabs in vial transport medium  Result Value Ref Range Status   SARS Coronavirus 2 by RT PCR NEGATIVE NEGATIVE Final    Comment: (NOTE) SARS-CoV-2 target nucleic acids are NOT DETECTED.  The SARS-CoV-2 RNA is generally detectable in upper respiratory specimens during the acute phase of infection. The lowest concentration of SARS-CoV-2 viral copies this assay can detect is 138 copies/mL. A negative result does not preclude SARS-Cov-2 infection and should not be used as the sole basis for treatment or other patient management decisions. A negative result may occur with  improper specimen collection/handling, submission of specimen other than nasopharyngeal swab, presence of viral mutation(s) within the areas targeted by this  assay, and inadequate number of viral copies(<138 copies/mL). A negative result must be combined with clinical observations, patient history, and epidemiological information. The expected result is Negative.  Fact Sheet for Patients:  EntrepreneurPulse.com.au  Fact Sheet for Healthcare Providers:  IncredibleEmployment.be  This test is no t yet approved or cleared by the [place] FDA and  has been authorized for detection and/or diagnosis of SARS-CoV-2 by FDA under an Emergency Use Authorization (EUA). This EUA will remain  in effect (meaning this test can be used) for the duration of the COVID-19 declaration under Section 564(b)(1) of the Act, 21 U.S.C.section 360bbb-3(b)(1), unless the authorization is terminated  or revoked sooner.       Influenza A by PCR NEGATIVE NEGATIVE Final   Influenza B by PCR NEGATIVE NEGATIVE Final    Comment: (NOTE) The Xpert Xpress SARS-CoV-2/FLU/RSV plus assay is intended as an aid in the diagnosis of influenza from Nasopharyngeal swab specimens and should not be used as a sole basis for treatment. Nasal washings and aspirates are unacceptable for Xpert Xpress SARS-CoV-2/FLU/RSV testing.  Fact Sheet for Patients: EntrepreneurPulse.com.au  Fact Sheet for Healthcare Providers: IncredibleEmployment.be  This test is not yet approved or cleared by the [place] FDA and has been authorized for detection and/or diagnosis of SARS-CoV-2 by FDA under an Emergency Use Authorization (EUA). This EUA will remain in effect (meaning this test can be used) for the duration of the COVID-19 declaration under Section 564(b)(1) of the Act, 21 U.S.C. section 360bbb-3(b)(1), unless the authorization is terminated or revoked.  Performed at [facility], [address] [place], [place] [address]   Blood culture (routine x 2)     Status: None   Collection Time: 07/13/20 11:56  AM   Specimen: BLOOD  Result Value Ref Range Status   Specimen Description BLOOD PORTA CATH  Final   Special Requests   Final    BOTTLES DRAWN AEROBIC AND ANAEROBIC Blood Culture adequate volume   Culture   Final    NO GROWTH 5 DAYS Performed at [facility], [address] [place], [place] [address]    Report Status 07/18/2020 FINAL  Final  Blood culture (routine x 2)     Status: None   Collection Time: 07/13/20 11:56 AM   Specimen: BLOOD  Result Value Ref Range Status   Specimen Description BLOOD LEFT ANTECUBITAL  Final   Special Requests   Final    BOTTLES DRAWN AEROBIC AND ANAEROBIC Blood Culture adequate volume   Culture   Final    NO GROWTH 5 DAYS Performed at [facility], [place]  [address], [place], [place] [address]    Report Status 07/18/2020 FINAL  Final  Urine Culture     Status: None   Collection Time: 07/14/20  5:45 AM   Specimen: Urine, Random  Result Value Ref Range Status   Specimen Description   Final    URINE, RANDOM Performed at [facility], [address] [place], [place] [address]    Special Requests   Final    NONE Performed at [facility], [address] [place], [place] [address]    Culture   Final    NO GROWTH Performed at [facility], [place] [address] [place], [place] [address]    Report Status 07/15/2020 FINAL  Final    Coagulation Studies: No results for input(s): LABPROT, INR in the last 72 hours.  Urinalysis: No results for input(s): COLORURINE, LABSPEC, PHURINE, GLUCOSEU, HGBUR, BILIRUBINUR, KETONESUR, PROTEINUR, UROBILINOGEN, NITRITE, LEUKOCYTESUR in the last 72 hours.  Invalid input(s): APPERANCEUR    Imaging: No results found.   Medications:   . ceFEPime (MAXIPIME) IV 2 g (07/18/20 1248)   . acetaminophen  650 mg Oral Once  . acetaminophen-codeine  1 tablet Oral BID   And  . codeine  30 mg Oral BID  . aspirin EC  81 mg Oral Daily  . diphenhydrAMINE  25 mg Oral Once  .  docusate sodium  100 mg Oral Daily  . famotidine  20 mg Oral Daily  . ferrous sulfate  325 mg Oral Q breakfast  . FLUoxetine  20 mg Oral BID  . gabapentin  100 mg Oral QHS  . heparin  5,000 Units Subcutaneous Q8H  . patiromer  16.8 g Oral Daily  . simvastatin  20 mg Oral QPM   albuterol, ALPRAZolam, heparin lock flush, heparin lock flush, ondansetron **OR** ondansetron (ZOFRAN) IV, senna, sodium chloride flush, sodium chloride flush  Assessment/ Plan:  [name] is a 64 y.o. black female with stage IV breast cancer, diabetes mellitus type II insulin dependent, asthma, CVA with left residual weakness, congestive heart failure, depression, hypertension, hyperlipidemia who was admitted to [facility] on 07/13/2020 for Cough [R05.9] Cancer ([place]) [C80.1] Chronic respiratory failure with hypoxia ([place]) [J96.11] AKI (acute kidney injury) ([place]) [N17.9] S/P amputation of lesser toe, left ([facility]) [Z89.422] Leukocytosis, unspecified type [D72.829] Sepsis, due to unspecified organism, unspecified whether acute organ dysfunction present ([place]) [A41.9]  1. Acute Kidney Injury on chronic kidney disease stage IV with proteinuria: baseline creatinine of  2.44, GFR of 22 on 07/02/2020.  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   Lab Results  Component Value Date   CREATININE 5.40 (H) 07/18/2020   CREATININE 5.27 (H) 07/17/2020   CREATININE 5.29 (H) 07/16/2020   Patient does not want to undergo dialysis treatments  Palliative care involved in the case, family meeting yesterday  2. Hypertension: Blood pressure 102/70 this morning   3. Anemia of chronic kidney disease Lab Results  Component Value Date   HGB 6.5 (L) 07/18/2020  Continue Ferrous Sulfate   4. Secondary Hyperparathyroidism Lab Results  Component Value Date   PTH 54 07/16/2020   CALCIUM 8.6 (L) 07/18/2020    PHOS 2.9 04/04/2020  No acute indication for Phos binders  5.Hyperkalemia  Potassium 5.2 today Continue Patiromer 16.8 PO daily  Nephrology will sign off at this point, please call us with any further questions   LOS: 5 Xela Oregel  Laurabelle Gorczyca 12/15/20213:24 PM

## 2020-07-18 NOTE — Progress Notes (Signed)
PROGRESS NOTE    [name]   GNF:[id number]  DOB: 05-10-1956  PCP: [name], MD    DOA: 07/13/2020 LOS: 5   Brief Narrative   From note of 12/14 by Dr. Si Raider: "[name] is a 64 y.o. female with medical history significant for stage IV breast cancer on chemotherapy, stage IV chronic kidney disease, depression, chronic respiratory failure, hypertension, GERD who was sent to the emergency room from the cancer center for evaluation of abnormal labs.  Patient had gone to the cancer center for chemotherapy and labs drawn showed worsening of her renal function from baseline.  At baseline she has a serum creatinine of 2.4 but today on admission her serum creatinine was 6.5.  Patient states that she cut back on her fluid intake because she recently had thoracentesis done in the hospital with drainage of about a liter of fluid from both lungs.  She states that her mouth stays dry but denies having any urinary frequency or nocturia.  She denies feeling dizzy or lightheaded and had a blood pressure in the 80s at the cancer center for which she received 1 L of IV fluid. She has a cough productive of clear phlegm. She denies having any fever or chills, no abdominal pain, no chest pain, no nausea, no vomiting, no diaphoresis or palpitations."     Assessment & Plan   Principal Problem:   AKI (acute kidney injury) ([place]) Active Problems:   Benign essential HTN   Anxiety and depression   Type 2 diabetes mellitus with diabetic chronic kidney disease (HCC)   Anemia of chronic disease   Metastatic breast cancer (HCC)   Antineoplastic chemotherapy induced anemia   Weakness generalized   Malignant pleural effusion   Leukocytosis   Hyponatremia   Pressure injury of skin   AKI superimposed on CKD stage IV Hyperkalemia AKI likely secondary to prerenal azotemia in the setting of poor p.o. intake and overdiuresis.  CKD secondary to hypertension, diabetes and chemotherapy.  Baseline  creatinine around 2.4, creatinine was 6.5 on admission. Renal ultrasound on 12/10 - for hydronephrosis, kidneys with signs of medical renal disease. --Nephrology is following. --Patient does not wish to undergo dialysis. --Palliative consulted --Monitor renal function electrolytes --Strict I/O's  Metastatic breast cancer with malignant pleural effusions -known stage IV breast cancer on palliative chemotherapy Doxil weekly.  Followed by Dr. Rogue Bussing who is consulted. PET CT of 03/28/2020, with associated evidence of visceral/parietal pleural metastases. Re demonstrationofskeletal metastases, with new/enlarging lytic lesions of the thoracic spine compatible with progression. The left breast mass appears to be enlarging compared to the prior PET. Status post thoracentesis about 4 weeks ago with about 1 L fluid drained off.  Respiratory status stable. Overall poor prognosis. --Oncology and palliative care are consulted Per dr. Jacinto Reap, "we discussed hospice.. but decided to give few more days to see how she does; and in mean time family from out of town come to see her.. as she does not have much time. so, for now.. just doing what we hav ebeen doing.. "  Chronic respiratory failure with hypoxia -Baseline 3 L/min, currently on 4.  Secondary to metastatic breast cancer malignant pleural effusions.  Continue supplemental O2 as needed, wean as tolerated.  Leukocytosis Recent left foot osteomyelitis -recently admitted, status post partial ray amputation and IV antibiotics which have been completed.  Has been followed by wound care and wound reports to be healing well.  No evidence of worsening infection at this point.  Chest  x-ray without any obvious infiltrate and respiratory status is stable unlikely a pneumonia.  No dysuria or GU symptoms, abdominal pain nausea vomiting. --We will continue empiric cefepime for now per oncology --Follow cultures --Wound care  Hyponatremia -most likely was  hypovolemic, improved with IV hydration  Type 2 diabetes /hypoglycemia -hypoglycemia appears resolved, was on D5 infusion previously.  Chronic anemia of renal disease -and possibly due to chemotherapy as well.  Continue home iron supplement.  Monitor CBC.    Hypertension -presented hypotensive and now blood pressures within normal.  Home Bumex, amlodipine, atenolol, clonidine and Lidopril are held.  Depression/anxiety -continue fluoxetine and alprazolam  Chronic pain -due to metastatic disease.  Continue home gabapentin.   History of CVA -continue aspirin and statin   Constipation -continue home Colace and senna  History of diastolic CHF -hold Bumex  Stage III sacral decubitus ulcer -present on admission Pressure Injury 07/14/20 Sacrum Mid Stage 2 -  Partial thickness loss of dermis presenting as a shallow open injury with a red, pink wound bed without slough. (Active)  07/14/20 1405  Location: Sacrum  Location Orientation: Mid  Staging: Stage 2 -  Partial thickness loss of dermis presenting as a shallow open injury with a red, pink wound bed without slough.  Wound Description (Comments):   Present on Admission:      Pressure Injury 07/14/20 Heel Posterior;Right Deep Tissue Pressure Injury - Purple or maroon localized area of discolored intact skin or blood-filled blister due to damage of underlying soft tissue from pressure and/or shear. (Active)  07/14/20 1406  Location: Heel  Location Orientation: Posterior;Right  Staging: Deep Tissue Pressure Injury - Purple or maroon localized area of discolored intact skin or blood-filled blister due to damage of underlying soft tissue from pressure and/or shear.  Wound Description (Comments):   Present on Admission: Yes    Left foot ulcer -wound care consulted, no sign of infection at time of admission.  Monitor.   Patient BMI: Body mass index is 34.22 kg/m.   DVT prophylaxis: heparin injection 5,000 Units Start: 07/13/20  2200   Diet:  Diet Orders (From admission, onward)    Start     Ordered   07/13/20 1447  Diet Carb Modified Fluid consistency: Thin; Room service appropriate? Yes  Diet effective now       Question Answer Comment  Diet-HS Snack? Nothing   Calorie Level Medium 1600-2000   Fluid consistency: Thin   Room service appropriate? Yes      07/13/20 1447            Code Status: DNR    Subjective 07/18/20    Patient seen up in chair with son at bedside today.  She reports overall feeling fair.  Having cold spells on and off.  Denies diarrhea or constipation.  Confirms she uses 3 L/min oxygen at home.  No other acute complaints at this time.   Disposition Plan & Communication   Status is: Inpatient  Remains inpatient appropriate because:IV treatments appropriate due to intensity of illness or inability to take PO   Dispo: The patient is from: Home              Anticipated d/c is to: Home              Anticipated d/c date is: 3 days              Patient currently is not medically stable to d/c.   Family Communication: Son at bedside on rounds  today, 12/15   Consults, Procedures, Significant Events   Consultants:   Oncology  Palliative Care  Nephrology  Procedures:   None  Antimicrobials:  Anti-infectives (From admission, onward)   Start     Dose/Rate Route Frequency Ordered Stop   07/16/20 1200  ceFEPIme (MAXIPIME) 2 g in sodium chloride 0.9 % 100 mL IVPB        2 g 200 mL/hr over 30 Minutes Intravenous Every 24 hours 07/16/20 1022     07/13/20 1215  vancomycin (VANCOREADY) IVPB 1750 mg/350 mL        1,750 mg 175 mL/hr over 120 Minutes Intravenous  Once 07/13/20 1208 07/13/20 1617   07/13/20 1200  ceFEPIme (MAXIPIME) 2 g in sodium chloride 0.9 % 100 mL IVPB        2 g 200 mL/hr over 30 Minutes Intravenous  Once 07/13/20 1148 07/13/20 1405         Objective   Vitals:   07/18/20 0405 07/18/20 0804 07/18/20 1144 07/18/20 1647  BP: 102/60 102/70 98/77  109/71  Pulse: 74 74 69 74  Resp: 17     Temp: 98.6 F (37 C) 98.8 F (37.1 C) (!) 97.5 F (36.4 C) (!) 97.5 F (36.4 C)  TempSrc: Oral Oral Oral Oral  SpO2: 99% 98% 100% 100%  Weight: 84.9 kg     Height:        Intake/Output Summary (Last 24 hours) at 07/18/2020 1707 Last data filed at 07/18/2020 0300 Gross per 24 hour  Intake 240 ml  Output 300 ml  Net -60 ml   Filed Weights   07/16/20 0511 07/17/20 0421 07/18/20 0405  Weight: 82.4 kg 82.2 kg 84.9 kg    Physical Exam:  General exam: awake, alert, no acute distress, chronically ill appearing HEENT: moist mucus membranes, hearing grossly normal  Respiratory system: CTAB, no wheezes, rales or rhonchi, normal respiratory effort. Cardiovascular system: normal S1/S2, RRR, no pedal edema.   Gastrointestinal system: soft, NT, ND, +bowel sounds. Central nervous system: A&O x3. no gross focal neurologic deficits, normal speech Skin: dry, intact, normal temperature Psychiatry: normal mood, congruent affect, judgement and insight appear normal  Labs   Data Reviewed: I have personally reviewed following labs and imaging studies  CBC: Recent Labs  Lab 07/13/20 0825 07/14/20 0434 07/15/20 0656 07/16/20 0608 07/17/20 0527 07/18/20 0524  WBC 22.3* 22.0* 22.2* 24.3* 35.0* 23.5*  NEUTROABS 19.8*  --   --   --   --   --   HGB 8.6* 7.7* 8.5* 7.3* 8.0* 6.5*  HCT 25.0* 23.0* 25.3* 22.6* 24.8* 19.9*  MCV 88.3 89.5 89.7 91.5 93.2 91.7  PLT 358 334 385 402* 508* 810*   Basic Metabolic Panel: Recent Labs  Lab 07/14/20 0434 07/15/20 0656 07/16/20 0608 07/16/20 1250 07/16/20 1830 07/17/20 0011 07/17/20 0527 07/17/20 1300 07/18/20 0524  NA 129* 132* 135  --   --   --  136  --  136  K 5.0 4.6 4.9  --   --   --  5.5*  --  5.2*  CL 93* 98 101  --   --   --  103  --  102  CO2 25 22 22   --   --   --  22  --  22  GLUCOSE 180* 67* 85   < > 116* 121* 135* 174* 95  BUN 102* 97* 95*  --   --   --  93*  --  98*  CREATININE 6.01*  5.75* 5.29*  --   --   --  5.27*  --  5.40*  CALCIUM 8.2* 8.5* 8.3*  --   --   --  9.0  --  8.6*   < > = values in this interval not displayed.   GFR: Estimated Creatinine Clearance: 10.8 mL/min (A) (by C-G formula based on SCr of 5.4 mg/dL (H)). Liver Function Tests: Recent Labs  Lab 07/13/20 0825  AST 14*  ALT 12  ALKPHOS 90  BILITOT 0.8  PROT 7.2  ALBUMIN 2.2*   No results for input(s): LIPASE, AMYLASE in the last 168 hours. No results for input(s): AMMONIA in the last 168 hours. Coagulation Profile: Recent Labs  Lab 07/14/20 0542  INR 1.3*   Cardiac Enzymes: No results for input(s): CKTOTAL, CKMB, CKMBINDEX, TROPONINI in the last 168 hours. BNP (last 3 results) No results for input(s): PROBNP in the last 8760 hours. HbA1C: No results for input(s): HGBA1C in the last 72 hours. CBG: Recent Labs  Lab 07/18/20 0805 07/18/20 1146 07/18/20 1216 07/18/20 1258 07/18/20 1648  GLUCAP 76 64* 60* 128* 100*   Lipid Profile: No results for input(s): CHOL, HDL, LDLCALC, TRIG, CHOLHDL, LDLDIRECT in the last 72 hours. Thyroid Function Tests: No results for input(s): TSH, T4TOTAL, FREET4, T3FREE, THYROIDAB in the last 72 hours. Anemia Panel: No results for input(s): VITAMINB12, FOLATE, FERRITIN, TIBC, IRON, RETICCTPCT in the last 72 hours. Sepsis Labs: Recent Labs  Lab 07/13/20 1156  LATICACIDVEN 1.5    Recent Results (from the past 240 hour(s))  Resp Panel by RT-PCR (Flu A&B, Covid) Nasopharyngeal Swab     Status: None   Collection Time: 07/13/20 11:56 AM   Specimen: Nasopharyngeal Swab; Nasopharyngeal(NP) swabs in vial transport medium  Result Value Ref Range Status   SARS Coronavirus 2 by RT PCR NEGATIVE NEGATIVE Final    Comment: (NOTE) SARS-CoV-2 target nucleic acids are NOT DETECTED.  The SARS-CoV-2 RNA is generally detectable in upper respiratory specimens during the acute phase of infection. The lowest concentration of SARS-CoV-2 viral copies this assay can  detect is 138 copies/mL. A negative result does not preclude SARS-Cov-2 infection and should not be used as the sole basis for treatment or other patient management decisions. A negative result may occur with  improper specimen collection/handling, submission of specimen other than nasopharyngeal swab, presence of viral mutation(s) within the areas targeted by this assay, and inadequate number of viral copies(<138 copies/mL). A negative result must be combined with clinical observations, patient history, and epidemiological information. The expected result is Negative.  Fact Sheet for Patients:  EntrepreneurPulse.com.au  Fact Sheet for Healthcare Providers:  IncredibleEmployment.be  This test is no t yet approved or cleared by the [place] FDA and  has been authorized for detection and/or diagnosis of SARS-CoV-2 by FDA under an Emergency Use Authorization (EUA). This EUA will remain  in effect (meaning this test can be used) for the duration of the COVID-19 declaration under Section 564(b)(1) of the Act, 21 U.S.C.section 360bbb-3(b)(1), unless the authorization is terminated  or revoked sooner.       Influenza A by PCR NEGATIVE NEGATIVE Final   Influenza B by PCR NEGATIVE NEGATIVE Final    Comment: (NOTE) The Xpert Xpress SARS-CoV-2/FLU/RSV plus assay is intended as an aid in the diagnosis of influenza from Nasopharyngeal swab specimens and should not be used as a sole basis for treatment. Nasal washings and aspirates are unacceptable for Xpert Xpress SARS-CoV-2/FLU/RSV testing.  Fact Sheet for Patients: EntrepreneurPulse.com.au  Fact Sheet for Healthcare Providers: IncredibleEmployment.be  This test is not yet approved or cleared by the [place] FDA and has been authorized for detection and/or diagnosis of SARS-CoV-2 by FDA under an Emergency Use Authorization (EUA). This EUA will remain in  effect (meaning this test can be used) for the duration of the COVID-19 declaration under Section 564(b)(1) of the Act, 21 U.S.C. section 360bbb-3(b)(1), unless the authorization is terminated or revoked.  Performed at [facility], [address] [place], [place] [address]   Blood culture (routine x 2)     Status: None   Collection Time: 07/13/20 11:56 AM   Specimen: BLOOD  Result Value Ref Range Status   Specimen Description BLOOD PORTA CATH  Final   Special Requests   Final    BOTTLES DRAWN AEROBIC AND ANAEROBIC Blood Culture adequate volume   Culture   Final    NO GROWTH 5 DAYS Performed at [facility], [address], [place], [place] [address]    Report Status 07/18/2020 FINAL  Final  Blood culture (routine x 2)     Status: None   Collection Time: 07/13/20 11:56 AM   Specimen: BLOOD  Result Value Ref Range Status   Specimen Description BLOOD LEFT ANTECUBITAL  Final   Special Requests   Final    BOTTLES DRAWN AEROBIC AND ANAEROBIC Blood Culture adequate volume   Culture   Final    NO GROWTH 5 DAYS Performed at [facility], [address], [place], [place] [address]    Report Status 07/18/2020 FINAL  Final  Urine Culture     Status: None   Collection Time: 07/14/20  5:45 AM   Specimen: Urine, Random  Result Value Ref Range Status   Specimen Description   Final    URINE, RANDOM Performed at [facility], [address] [place], [place] [address]    Special Requests   Final    NONE Performed at [facility], [address] [place], [place] [address]    Culture   Final    NO GROWTH Performed at [facility], [place] [address] [place], [place] [address]    Report Status 07/15/2020 FINAL  Final      Imaging Studies   No results found.   Medications   Scheduled Meds: . acetaminophen  650 mg Oral Once  . acetaminophen-codeine  1 tablet Oral BID   And  . codeine  30 mg Oral BID  . aspirin EC  81 mg  Oral Daily  . diphenhydrAMINE  25 mg Oral Once  . docusate sodium  100 mg Oral Daily  . famotidine  20 mg Oral Daily  . ferrous sulfate  325 mg Oral Q breakfast  . FLUoxetine  20 mg Oral BID  . gabapentin  100 mg Oral QHS  . heparin  5,000 Units Subcutaneous Q8H  . patiromer  16.8 g Oral Daily  . simvastatin  20 mg Oral QPM   Continuous Infusions: . ceFEPime (MAXIPIME) IV 2 g (07/18/20 1248)       LOS: 5 days    Time spent: 30 minutes    Ezekiel Slocumb, DO [facility]  07/18/2020, 5:07 PM    If 7PM-7AM, please contact night-coverage. How to contact the [facility] Attending or Consulting provider [place] or covering provider during after hours [place], for this patient?    1. Check the care team in [facility] and look for a) attending/consulting [facility] provider  listed and b) the [facility] team listed 2. Log into [email or web address] and use [place] universal password to access. If you do not have the password, please contact the hospital operator. 3. Locate the [facility] provider you are looking for under [facility] and page to a number that you can be directly reached. 4. If you still have difficulty reaching the provider, please page the [facility] (Director on Call) for the Hospitalists listed on amion for assistance.

## 2020-07-18 NOTE — Progress Notes (Signed)
[name]   DOB:1956-01-27   [id number]    Subjective: No acute events overnight.  Had a good night sleep.  Feels weak overall.  Objective:  Vitals:   07/18/20 1144 07/18/20 1647  BP: 98/77 109/71  Pulse: 69 74  Resp:    Temp: (!) 97.5 F (36.4 C) (!) 97.5 F (36.4 C)  SpO2: 100% 100%     Intake/Output Summary (Last 24 hours) at 07/18/2020 1951 Last data filed at 07/18/2020 0300 Gross per 24 hour  Intake --  Output 300 ml  Net -300 ml    Physical Exam Constitutional:      Comments: Patient resting in the chair comfortably.  No acute distress.  Alone.  HENT:     Head: Normocephalic and atraumatic.     Mouth/Throat:     Mouth: Oropharynx is clear and moist.     Pharynx: No oropharyngeal exudate.  Eyes:     Pupils: Pupils are equal, round, and reactive to light.  Cardiovascular:     Rate and Rhythm: Normal rate and regular rhythm.  Pulmonary:     Effort: No respiratory distress.     Breath sounds: No wheezing.     Comments: Decreased breath sounds bilaterally. Abdominal:     General: Bowel sounds are normal. There is no distension.     Palpations: Abdomen is soft. There is no mass.     Tenderness: There is no abdominal tenderness. There is no guarding or rebound.  Musculoskeletal:        General: No tenderness or edema. Normal range of motion.     Cervical back: Normal range of motion and neck supple.     Comments: Bilateral extremity swelling.  Ulcerations noted left more than right.  Bandaged.  Skin:    General: Skin is warm.  Neurological:     Mental Status: She is alert and oriented to person, place, and time.  Psychiatric:        Mood and Affect: Affect normal.      Labs:  Lab Results  Component Value Date   WBC 23.5 (H) 07/18/2020   HGB 6.5 (L) 07/18/2020   HCT 19.9 (L) 07/18/2020   MCV 91.7 07/18/2020   PLT 403 (H) 07/18/2020   NEUTROABS 19.8 (H) 07/13/2020    Lab Results  Component Value Date   NA 136 07/18/2020   K 5.2 (H) 07/18/2020    CL 102 07/18/2020   CO2 22 07/18/2020    Studies:  No results found.  Carcinoma of upper-inner quadrant of left breast in female, estrogen receptor positive ([place]) 63 year old female patient with a history of metastatic/stage IV breast cancer multiple other medical problems including chronic kidney disease stage IV, poorly controlled diabetes; diabetic foot ulcer-is currently admitted to hospital for hypotension/leukocytosis and acute renal failure.  # Stage IV breast cancer ER/PR positive HER-2 negative - October 30 CT scan-progression of disease-pleural metastases/bilateral effusion; worsening lytic lesions in the bones;  Currently on weekly DOXIL #3 so far.  Chemotherapy discontinued-given declining performance status/worsening renal function/other comorbidities.  #Acute on chronic kidney disease-creatinine today 5.5; stable [ from 6.5 on admission]; patient is  poor candidate for dialysis.  # Leukocytosis-close infection empiric antibiotics; leukocytosis improving.  Continue antibiotics.  # Anemia- sec to chemo-CKD- IV-in general hemoglobin around 7-8; hemoglobin 6.7.  Recommend proceeding with 1 unit of PRBC transfusion.  #DNR/DNI.  Continue current scope of care for the next few days; can transition to hospice/at home.   Gloria Sickle, MD  07/18/2020  7:51 PM

## 2020-07-19 ENCOUNTER — Inpatient Hospital Stay: Payer: Medicare HMO

## 2020-07-19 DIAGNOSIS — J910 Malignant pleural effusion: Secondary | ICD-10-CM

## 2020-07-19 LAB — BASIC METABOLIC PANEL
Anion gap: 10 (ref 5–15)
BUN: 99 mg/dL — ABNORMAL HIGH (ref 8–23)
CO2: 22 mmol/L (ref 22–32)
Calcium: 8.8 mg/dL — ABNORMAL LOW (ref 8.9–10.3)
Chloride: 103 mmol/L (ref 98–111)
Creatinine, Ser: 5.4 mg/dL — ABNORMAL HIGH (ref 0.44–1.00)
GFR, Estimated: 8 mL/min — ABNORMAL LOW (ref >60)
Glucose, Bld: 62 mg/dL — ABNORMAL LOW (ref 70–99)
Potassium: 5.2 mmol/L — ABNORMAL HIGH (ref 3.5–5.1)
Sodium: 135 mmol/L (ref 135–145)

## 2020-07-19 LAB — TYPE AND SCREEN
ABO/RH(D): A POS
Antibody Screen: NEGATIVE
Unit division: 0

## 2020-07-19 LAB — GLUCOSE, CAPILLARY
Glucose-Capillary: 55 mg/dL — ABNORMAL LOW (ref 70–99)
Glucose-Capillary: 76 mg/dL (ref 70–99)
Glucose-Capillary: 90 mg/dL (ref 70–99)
Glucose-Capillary: 116 mg/dL — ABNORMAL HIGH (ref 70–99)
Glucose-Capillary: 120 mg/dL — ABNORMAL HIGH (ref 70–99)
Glucose-Capillary: 111 mg/dL — ABNORMAL HIGH (ref 70–99)

## 2020-07-19 LAB — CBC
HCT: 24.6 % — ABNORMAL LOW (ref 36.0–46.0)
Hemoglobin: 7.9 g/dL — ABNORMAL LOW (ref 12.0–15.0)
MCH: 29.2 pg (ref 26.0–34.0)
MCHC: 32.1 g/dL (ref 30.0–36.0)
MCV: 90.8 fL (ref 80.0–100.0)
Platelets: 400 10*3/uL (ref 150–400)
RBC: 2.71 MIL/uL — ABNORMAL LOW (ref 3.87–5.11)
RDW: 17.9 % — ABNORMAL HIGH (ref 11.5–15.5)
WBC: 22.8 10*3/uL — ABNORMAL HIGH (ref 4.0–10.5)
nRBC: 0.1 % (ref 0.0–0.2)

## 2020-07-19 LAB — BPAM RBC
Blood Product Expiration Date: 202112302359
ISSUE DATE / TIME: 202112152051
Unit Type and Rh: 600

## 2020-07-19 MED ORDER — DEXTROSE 5 % IV SOLN: INTRAVENOUS | Status: DC

## 2020-07-19 MED ORDER — SODIUM ZIRCONIUM CYCLOSILICATE 5 G PO PACK: 5.0000 g | PACK | Freq: Once | ORAL | Status: DC

## 2020-07-19 MED ORDER — DEXTROSE 50 % IV SOLN
12.5000 g | INTRAVENOUS | Status: AC
  Administered 2020-07-19: 07:00:00 12.5 g via INTRAVENOUS
  Filled 2020-07-19: qty 50

## 2020-07-19 NOTE — Progress Notes (Signed)
[name] Notified from RN patient with mental status change , delayed speech and word finding problems.  Bedside patient is alert, oriented. Answers somewhat delayed. Able to follow copmmands.  Patient has generalized overal weakness and she feels tired more than her baseline. She had received gabpentin approximately 1 hour ago which is concerning with her renal function that this could be medication related.  Also new left upper extremity edema in last 24 hours per nurse and patient. DDX CVA, brain mets, medication reaction Discussed further with patient possibilities and agreed plan to do CT scan to rule out acute event and monitor neuro changes overnight.  She understands results would not likely change course of treatment plan

## 2020-07-19 NOTE — Progress Notes (Signed)
[place]  [phone] [phone]   Name: [name] Date: 07/19/2020 MRN: [id number]  DOB: 02/18/1956  Patient Care Team: [name], [name], MD as Medical Oncologist (Medical Oncology) Corey Skains, MD as Consulting Physician (Cardiology)    REASON FOR CONSULTATION: [name] is a 64 y.o. female with multiple medical problems including stage IV breast cancer widely metastatic to lungs and bone on multiple previous lines of chemotherapy, stage IV CKD, anemia, history of CVA, and diabetes.Patient was hospitalized 03/29/2020-04/09/2020 with sepsis and osteomyelitis from a diabetic foot wound. 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 Patient was hospitalized again 06/02/2020-06/06/2020 with shortness of breath. She was found to have acute on chronic anemia. CT of the chest on 06/02/2020 revealed bilateral malignant pleural effusions and worsening lytic lesions in her thoracic spine consistent with disease progression. Patient is status post left-sided thoracentesis on 11/1 with 500 mL of fluid removed.   Patient has been receiving outpatient chemotherapy with doxorubicin.  She was admitted to the hospital on 07/13/2020 with acute on chronic renal failure.  Palliative care was consulted up address goals..    CODE STATUS: DNR  PAST MEDICAL HISTORY: Past Medical History:  Diagnosis Date   Anemia    Anxiety    Asthma    Cancer ([place]) 03/10/2018   Per NM PET order. Carcinoma of upper-inner quadrant of left breast in female, estrogen receptor positive .   Cancer ([place])    LUNG   CHF (congestive heart failure) ([place]) 1997   CKD (chronic kidney disease)    Depression    Diabetes mellitus, type 2 ([place])    Family history of breast cancer    Family history of colon cancer    Family history of ovarian cancer     Family history of pancreatic cancer    Family history of prostate cancer    Family history of stomach cancer    GERD (gastroesophageal reflux disease)    history of an ulcer   Hair loss    History of left breast cancer 05/29/14   History of partial hysterectomy 12/31/2016   Per patient.  Has not had a period in years.  Had a partial hysterectomy years ago.   Hypertension    Mitral valve regurgitation    Neuromuscular disorder (HCC)    neuropathies in hand   Obesity    Pancreatitis 1997   Stroke [facility]) 2010   with mild left arm weakness    PAST SURGICAL HISTORY:  Past Surgical History:  Procedure Laterality Date   AMPUTATION Left 03/30/2020   Procedure: AMPUTATION 5th RAY;  Surgeon: Samara Deist, DPM;  Location: [facility] ORS;  Service: Podiatry;  Laterality: Left;   APPLICATION OF WOUND VAC Left 03/30/2020   Procedure: APPLICATION OF WOUND VAC;  Surgeon: Samara Deist, DPM;  Location: [facility] OR[facility];  Service: Podiatry;  Laterality: Left;   CATARACT EXTRACTION W/PHACO Right 02/24/2019   Procedure: CATARACT EXTRACTION PHACO AND INTRAOCULAR LENS PLACEMENT ([place]) RIGHT DIABETES;  Surgeon: Marchia Meiers, MD;  Location: [facility] ORS;  Service: Ophthalmology;  Laterality: Right;  [place] 01:13.0 CDE 7.96 Fluid Pack Lot # [id number]   CATARACT EXTRACTION W/PHACO Left 03/24/2019   Procedure: CATARACT EXTRACTION PHACO AND INTRAOCULAR LENS PLACEMENT ([facility]) - left diabetic;  Surgeon: Marchia Meiers, MD;  Location: [facility] OR[facility];  Service: Ophthalmology;  Laterality: Left;  [place]  01:36 CDE 13.93 Fluid pack lot #  [id number]   CENTRAL LINE INSERTION-TUNNELED N/A 04/04/2020   Procedure: CENTRAL LINE INSERTION-TUNNELED;  Surgeon: Delana Meyer Dolores Lory, MD;  Location: [place] [facility];  Service: Cardiovascular;  Laterality: N/A;   CESAREAN SECTION     CHOLECYSTECTOMY     DIALYSIS/PERMA CATHETER REMOVAL N/A 05/01/2020   Procedure: DIALYSIS/PERMA CATHETER REMOVAL;  Surgeon: Katha Cabal, MD;  Location: [place] [facility];  Service: Cardiovascular;  Laterality: N/A;   EXCISION OF TONGUE LESION N/A 08/17/2018   Procedure: EXCISION OF TONGUE LESION WITH FROZEN SECTIONS;  Surgeon: Beverly Gust, MD;  Location: [facility] OR[facility];  Service: ENT;  Laterality: N/A;   EYE SURGERY Right    cataract extraction   IRRIGATION AND DEBRIDEMENT FOOT Left 03/30/2020   Procedure: IRRIGATION AND DEBRIDEMENT FOOT;  Surgeon: Samara Deist, DPM;  Location: [facility] OR[facility];  Service: Podiatry;  Laterality: Left;   PARTIAL HYSTERECTOMY  12/31/2016   Per patient, she has not had a period in years since she had a partial hysterectomy.   PORTA CATH INSERTION     TUBAL LIGATION      HEMATOLOGY/ONCOLOGY HISTORY:  Oncology History Overview Note  # OCT 2015-STAGE IV LEFT BREAST T2N1 [T=4cm; [name]; [name]; [name]; EBUS- Positive Paratrac/subcarinal LN s/p ? Taxotere [in [place]; [name]; [name];[name]; [name]; suv 3]; [name]; [name];[name];  # ? Bony lesions- PET sep 2016-non-hypermetabolic sclerotic lesions T10; Ant R iliac bone; inferior sternum- not on X-geva  # April 2019- PET scan Progression/pleural based lesions effusion/bone lesions.   # TAXOL   # SEP [id number]- July 2021- ERIBULIN; Progression  # SEP-OCT 2021- GEMCITABINE [interrupted because of hospitalization; progressive disease; discontinued]; #September 2021-left foot osteomyelitis s/p metatarsal amputation [Dr.Fowler/ Dr. Ola Spurr  #October 30th 2021 [hospitalization]-CT scan progressive pleural lesion bilateral pleural effusion/bone lesions; cytology negative status post thoracentesis x2; hemoglobin 6.5 s/p PRBC transfusion Discontinue gemcitabine  # NOV 11th 2021-Doxil weekly.  #  Poorly controlled Blood sugars- improved.   # Pancreatitis Hx/PEI- on creon in past / CKD IV [creat ~ 3-4; [name]; [name]; mild left sided weakness]  # Jan 2020-  Lobular lesion on tongue- s/p excision pyogenic granuloma [Dr.McQueen]   # GENETIC TESTING/COUNSELLING: HETEROZYGOUS Cystic Fibrosis Gene [explains hx of recurrent pancreatitis]  # MOLECULAR TESTING: NA   # PALLIATIVE CARE: 1/22-Discussed/Declined ------------------------------------------------   DIAGNOSIS: [ 2015] BREAST CA; [name]; [name] ;GOALS: Palliative  CURRENT/MOST RECENT THERAPY: DOXIL [C].     Carcinoma of upper-inner quadrant of left breast in female, estrogen receptor positive ([place])  04/29/2019 - 02/24/2020 Chemotherapy   The patient had eriBULin mesylate (HALAVEN) 2 mg in sodium chloride 0.9 % 100 mL chemo infusion, 2 mg, Intravenous,  Once, 12 of 14 cycles Dose modification: 1 mg/m2 (original dose 1 mg/m2, Cycle 1, Reason: Provider Judgment) Administration: 2 mg (06/03/2019), 2 mg (04/29/2019), 2 mg (06/10/2019), 2 mg (07/04/2019), 2 mg (07/11/2019), 2 mg (07/25/2019), 2 mg (08/03/2019), 2 mg (08/19/2019), 2 mg (08/26/2019), 2 mg (09/09/2019), 2 mg (09/16/2019), 2 mg (10/07/2019), 2 mg (10/14/2019), 2 mg (10/28/2019), 2 mg (11/04/2019), 2 mg (11/28/2019), 2 mg (12/09/2019), 2 mg (01/13/2020), 2 mg (01/20/2020), 2 mg (02/02/2020), 2 mg (02/13/2020), 2 mg (02/24/2020)  for chemotherapy treatment.    04/30/2020 -  05/28/2020 Chemotherapy   The patient had gemcitabine (GEMZAR) 1,600 mg in sodium chloride 0.9 % 250 mL chemo infusion, 1,596 mg, Intravenous,  Once, 2 of 5 cycles Administration: 1,600 mg (04/30/2020), 1,600 mg (05/07/2020), 1,600 mg (05/28/2020)  for chemotherapy treatment.    06/15/2020 -  Chemotherapy   The patient had DOXOrubicin HCL LIPOSOMAL (DOXIL) 30 mg in dextrose 5 % 250 mL chemo infusion, 15 mg/m2 = 30 mg, Intravenous,  Once, 3 of 7 cycles Administration: 30 mg (06/15/2020), 30 mg  (06/22/2020), 30 mg (07/02/2020)  for chemotherapy treatment.      ALLERGIES:  is allergic to fish-derived products, sulfamethoxazole-trimethoprim, and chlorhexidine.  MEDICATIONS:  Current Facility-Administered Medications  Medication Dose Route Frequency Provider Last Rate Last Admin   acetaminophen-codeine (TYLENOL #3) 300-30 MG per tablet 1 tablet  1 tablet Oral BID Wouk, Ailene Rud, MD   1 tablet at 07/17/20 2106   And   codeine tablet 30 mg  30 mg Oral BID Gwynne Edinger, MD   30 mg at 07/17/20 2107   albuterol (VENTOLIN HFA) 108 (90 Base) MCG/ACT inhaler 2 puff  2 puff Inhalation Q6H PRN Agbata, Tochukwu, MD       ALPRAZolam Duanne Moron) tablet 0.5 mg  0.5 mg Oral BID PRN Agbata, Tochukwu, MD   0.5 mg at 07/17/20 0525   aspirin EC tablet 81 mg  81 mg Oral Daily Agbata, Tochukwu, MD   81 mg at 07/19/20 1010   dextrose 5 % solution   Intravenous Continuous Ezekiel Slocumb, DO 75 mL/hr at 07/19/20 1202 New Bag at 07/19/20 1202   docusate sodium (COLACE) capsule 100 mg  100 mg Oral Daily Agbata, Tochukwu, MD   100 mg at 07/17/20 0918   famotidine (PEPCID) tablet 20 mg  20 mg Oral Daily Agbata, Tochukwu, MD   20 mg at 07/19/20 1010   ferrous sulfate tablet 325 mg  325 mg Oral Q breakfast Agbata, Tochukwu, MD   325 mg at 07/19/20 1010   FLUoxetine (PROZAC) capsule 20 mg  20 mg Oral BID Agbata, Tochukwu, MD   20 mg at 07/19/20 1010   gabapentin (NEURONTIN) capsule 100 mg  100 mg Oral QHS Agbata, Tochukwu, MD   100 mg at 07/18/20 2220   heparin injection 5,000 Units  5,000 Units Subcutaneous Q8H Agbata, Tochukwu, MD   5,000 Units at 07/19/20 0602   heparin lock flush 100 unit/mL  500 Units Intracatheter Daily PRN Cammie Sickle, MD       heparin lock flush 100 unit/mL  250 Units Intracatheter PRN Cammie Sickle, MD       ondansetron (ZOFRAN) tablet 4 mg  4 mg Oral Q6H PRN Agbata, Tochukwu, MD       Or   ondansetron (ZOFRAN) injection 4 mg  4 mg Intravenous  Q6H PRN Agbata, Tochukwu, MD       patiromer Daryll Drown) packet 16.8 g  16.8 g Oral Daily Wouk, Ailene Rud, MD   16.8 g at 07/19/20 1010   senna (SENOKOT) tablet 8.6 mg  1 tablet Oral BID PRN Agbata, Tochukwu, MD       simvastatin (ZOCOR) tablet 20 mg  20 mg Oral QPM Agbata, Tochukwu, MD   20 mg at 07/18/20 1851   sodium chloride flush (NS) 0.9 % injection 10 mL  10 mL Intracatheter PRN Charlaine Dalton R, MD       sodium chloride flush (NS) 0.9 % injection 3 mL  3 mL Intracatheter PRN Cammie Sickle, MD  Facility-Administered Medications Ordered in Other Encounters  Medication Dose Route Frequency Provider Last Rate Last Admin   sodium chloride flush (NS) 0.9 % injection 10 mL  10 mL Intravenous PRN Cammie Sickle, MD   10 mL at 01/30/16 1054    VITAL SIGNS: BP 101/62 (BP Location: Left Arm)    Pulse 76    Temp 98.1 F (36.7 C)    Resp 16    Ht 5\' 2"  (1.575 m)    Wt 187 lb 3.2 oz (84.9 kg)    SpO2 93%    BMI 34.24 kg/m  Filed Weights   07/17/20 0421 07/18/20 0405 07/19/20 0209  Weight: 181 lb 4.8 oz (82.2 kg) 187 lb 1.6 oz (84.9 kg) 187 lb 3.2 oz (84.9 kg)    Estimated body mass index is 34.24 kg/m as calculated from the following:   Height as of this encounter: 5\' 2"  (1.575 m).   Weight as of this encounter: 187 lb 3.2 oz (84.9 kg).  LABS: CBC:    Component Value Date/Time   WBC 22.8 (H) 07/19/2020 0438   HGB 7.9 (L) 07/19/2020 0438   HGB 12.5 06/06/2014 1102   HCT 24.6 (L) 07/19/2020 0438   HCT 37.6 06/06/2014 1102   PLT 400 07/19/2020 0438   PLT 396 06/06/2014 1102   MCV 90.8 07/19/2020 0438   MCV 91 06/06/2014 1102   NEUTROABS 19.8 (H) 07/13/2020 0825   NEUTROABS 6.9 (H) 06/06/2014 1102   LYMPHSABS 0.5 (L) 07/13/2020 0825   LYMPHSABS 2.0 06/06/2014 1102   MONOABS 1.6 (H) 07/13/2020 0825   MONOABS 0.7 06/06/2014 1102   EOSABS 0.0 07/13/2020 0825   EOSABS 0.0 06/06/2014 1102   BASOSABS 0.1 07/13/2020 0825   BASOSABS 0.1 06/06/2014 1102    Comprehensive Metabolic Panel:    Component Value Date/Time   NA 135 07/19/2020 0438   NA 130 (L) 06/06/2014 1102   K 5.2 (H) 07/19/2020 0438   K 3.9 06/06/2014 1102   CL 103 07/19/2020 0438   CL 95 (L) 06/06/2014 1102   CO2 22 07/19/2020 0438   CO2 28 06/06/2014 1102   BUN 99 (H) 07/19/2020 0438   BUN 17 06/06/2014 1102   CREATININE 5.40 (H) 07/19/2020 0438   CREATININE 1.63 (H) 06/06/2014 1102   GLUCOSE 62 (L) 07/19/2020 0438   GLUCOSE 349 (H) 06/06/2014 1102   CALCIUM 8.8 (L) 07/19/2020 0438   CALCIUM 9.2 06/06/2014 1102   AST 14 (L) 07/13/2020 0825   AST 7 (L) 06/06/2014 1102   ALT 12 07/13/2020 0825   ALT 12 (L) 06/06/2014 1102   ALKPHOS 90 07/13/2020 0825   ALKPHOS 74 06/06/2014 1102   BILITOT 0.8 07/13/2020 0825   BILITOT 0.4 06/06/2014 1102   PROT 7.2 07/13/2020 0825   PROT 8.2 06/06/2014 1102   ALBUMIN 2.2 (L) 07/13/2020 0825   ALBUMIN 3.3 (L) 06/06/2014 1102    RADIOGRAPHIC STUDIES: US Renal  Result Date: 07/13/2020 CLINICAL DATA:  64 year old female with acute kidney injury EXAM: RENAL / URINARY TRACT ULTRASOUND COMPLETE COMPARISON:  None. FINDINGS: Right Kidney: Renal measurements: 10.6 x 4.7 x 4.6 cm = volume: 120 mL. The renal parenchyma is increased in echogenicity. There is also increased conspicuity between the corticomedullary interface. Circumscribed anechoic simple cyst with imperceptible walls are present. There are 2 measured at 1.9 x 1.8 x 1.9 cm and 1.7 x 1.3 x 1.4 cm. The larger cyst is present exophytic from the lower pole while the smaller cyst is partially exophytic  from the interpolar region. No hydronephrosis or nephrolithiasis. Left Kidney: Renal measurements: 10.6 x 5.1 x 4.1 cm = volume: 117 mL. Increased parenchymal echogenicity. No hydronephrosis or nephrolithiasis. Tiny anechoic simple cyst measures 0.8 x 0.8 x 0.6 cm. Bladder: Appears normal for degree of bladder distention. Other: None. IMPRESSION: 1. No evidence of hydronephrosis. 2.  Echogenic renal parenchyma bilaterally consistent with medical renal disease. 3. Simple renal cysts bilaterally. Electronically Signed   By: Jacqulynn Cadet M.D.   On: 07/13/2020 13:09   DG Chest Port 1 View  Result Date: 07/13/2020 CLINICAL DATA:  Questionable sepsis. EXAM: PORTABLE CHEST 1 VIEW COMPARISON:  06/05/2020 and older exams. FINDINGS: There is bilateral lung base opacity obscuring hemidiaphragms consistent with a combination of pleural effusions and atelectasis and/or infection. Upper lungs remain clear. No pneumothorax. Cardiac silhouette is normal in size.  No mediastinal masses. Right anterior chest wall Port-A-Cath is stable. Skeletal structures are grossly intact. IMPRESSION: 1. Bilateral pleural effusions with associated lung base opacity, similar to the prior chest radiograph. Lung base opacities may be due to atelectasis, pneumonia or a combination. No convincing pulmonary edema. No pneumothorax. Electronically Signed   By: Lajean Manes M.D.   On: 07/13/2020 12:10    PERFORMANCE STATUS (ECOG) : 3 - Symptomatic, >50% confined to bed  Review of Systems Unless otherwise noted, a complete review of systems is negative.  Physical Exam General: NAD Pulmonary: Unlabored Extremities: no edema, no joint deformities Skin: no rashes Neurological: Weakness but otherwise nonfocal  IMPRESSION: Follow-up visit.  Patient is accompanied by her significant other.  Patient denies any acute changes or concerns today.  She appears symptomatically stable.  Serum creatinine persistently elevated.  Patient's goals are still aligned with comfort and hospice at home.  Appreciate hospice liaison support with coordinating discharge.  Patient's significant other also verbalized agreement with plan for hospice at home.  Anticipate discharge tomorrow.  PLAN: -Home with hospice when medically stable  Case and plan discussed with Dr. Rogue Bussing  Time Total: 15 minutes  Visit consisted of  counseling and education dealing with the complex and emotionally intense issues of symptom management and palliative care in the setting of serious and potentially life-threatening illness.Greater than 50%  of this time was spent counseling and coordinating care related to the above assessment and plan.  Signed by: Altha Harm, PhD, NP-C

## 2020-07-19 NOTE — Care Management Important Message (Signed)
Important Message  Patient Details  Name: [name] MRN: [id number] Date of Birth: Apr 16, 1956   Medicare Important Message Given:  Yes     Dannette Barbara 07/19/2020, 1:34 PM

## 2020-07-19 NOTE — Progress Notes (Signed)
PROGRESS NOTE    JERILYN GILLASPIE   UXN:[id number]  DOB: 04-01-1956  PCP: [name], MD    DOA: 07/13/2020 LOS: 6   Brief Narrative   From note of 12/14 by Dr. Si Raider: "[name] is a 64 y.o. female with medical history significant for stage IV breast cancer on chemotherapy, stage IV chronic kidney disease, depression, chronic respiratory failure, hypertension, GERD who was sent to the emergency room from the cancer center for evaluation of abnormal labs.  Patient had gone to the cancer center for chemotherapy and labs drawn showed worsening of her renal function from baseline.  At baseline she has a serum creatinine of 2.4 but today on admission her serum creatinine was 6.5.  Patient states that she cut back on her fluid intake because she recently had thoracentesis done in the hospital with drainage of about a liter of fluid from both lungs.  She states that her mouth stays dry but denies having any urinary frequency or nocturia.  She denies feeling dizzy or lightheaded and had a blood pressure in the 80s at the cancer center for which she received 1 L of IV fluid. She has a cough productive of clear phlegm. She denies having any fever or chills, no abdominal pain, no chest pain, no nausea, no vomiting, no diaphoresis or palpitations."     Assessment & Plan   Principal Problem:   AKI (acute kidney injury) ([place]) Active Problems:   Benign essential HTN   Anxiety and depression   Type 2 diabetes mellitus with diabetic chronic kidney disease (HCC)   Anemia of chronic disease   Metastatic breast cancer (HCC)   Antineoplastic chemotherapy induced anemia   Weakness generalized   Malignant pleural effusion   Leukocytosis   Hyponatremia   Pressure injury of skin   AKI superimposed on CKD stage IV Hyperkalemia AKI likely secondary to prerenal azotemia in the setting of poor p.o. intake and overdiuresis.  CKD secondary to hypertension, diabetes and chemotherapy.  Baseline  creatinine around 2.4, creatinine was 6.5 on admission. Renal ultrasound on 12/10 - for hydronephrosis, kidneys with signs of medical renal disease. --Nephrology was consulted --Patient does not wish to undergo dialysis and would be poor candidate --Palliative consulted --No further labs  Metastatic breast cancer with malignant pleural effusions -known stage IV breast cancer on palliative chemotherapy Doxil weekly.  Followed by Dr. Rogue Bussing who is consulted. PET CT of 03/28/2020, with associated evidence of visceral/parietal pleural metastases. Re demonstrationofskeletal metastases, with new/enlarging lytic lesions of the thoracic spine compatible with progression. The left breast mass appears to be enlarging compared to the prior PET. Status post thoracentesis about 4 weeks ago with about 1 L fluid drained off.   Respiratory status stable. Overall poor prognosis. Pt has agreed to home hospice, plan is for d/c tomorrow.   Chronic respiratory failure with hypoxia -Baseline 3 L/min, currently on 4.  Secondary to metastatic breast cancer malignant pleural effusions.  Continue supplemental O2 as needed, wean as tolerated.  Leukocytosis Recent left foot osteomyelitis -recently admitted, status post partial ray amputation and IV antibiotics which have been completed.  Has been followed by wound care and wound reports to be healing well.  No evidence of worsening infection at this point.  Chest x-ray without any obvious infiltrate and respiratory status is stable unlikely a pneumonia.  No dysuria or GU symptoms, abdominal pain nausea vomiting. --Stop Cefepime since hospice d/c tomorrow --Wound care  Hyponatremia -most likely was hypovolemic, improved with IV  hydration  Type 2 diabetes /hypoglycemia -hypoglycemia appears resolved, was on D5 infusion previously.  Chronic anemia of renal disease -and possibly due to chemotherapy as well.  Continue home iron supplement.  Monitor CBC.     Hypertension -presented hypotensive and now blood pressures within normal.  Home Bumex, amlodipine, atenolol, clonidine and Lidopril are held.  Depression/anxiety -continue fluoxetine and alprazolam  Chronic pain -due to metastatic disease.  Continue home gabapentin.   History of CVA -continue aspirin and statin   Constipation -continue home Colace and senna  History of diastolic CHF -hold Bumex  Stage III sacral decubitus ulcer -present on admission Pressure Injury 07/14/20 Sacrum Mid Stage 2 -  Partial thickness loss of dermis presenting as a shallow open injury with a red, pink wound bed without slough. (Active)  07/14/20 1405  Location: Sacrum  Location Orientation: Mid  Staging: Stage 2 -  Partial thickness loss of dermis presenting as a shallow open injury with a red, pink wound bed without slough.  Wound Description (Comments):   Present on Admission:      Pressure Injury 07/14/20 Heel Posterior;Right Deep Tissue Pressure Injury - Purple or maroon localized area of discolored intact skin or blood-filled blister due to damage of underlying soft tissue from pressure and/or shear. (Active)  07/14/20 1406  Location: Heel  Location Orientation: Posterior;Right  Staging: Deep Tissue Pressure Injury - Purple or maroon localized area of discolored intact skin or blood-filled blister due to damage of underlying soft tissue from pressure and/or shear.  Wound Description (Comments):   Present on Admission: Yes    Left foot ulcer -wound care consulted, no sign of infection at time of admission.  Monitor.   Patient BMI: Body mass index is 34.24 kg/m.   DVT prophylaxis: heparin injection 5,000 Units Start: 07/13/20 2200   Diet:  Diet Orders (From admission, onward)    Start     Ordered   07/13/20 1447  Diet Carb Modified Fluid consistency: Thin; Room service appropriate? Yes  Diet effective now       Question Answer Comment  Diet-HS Snack? Nothing   Calorie Level Medium  1600-2000   Fluid consistency: Thin   Room service appropriate? Yes      07/13/20 1447            Code Status: DNR    Subjective 07/19/20    Patient seen up in chair this AM.  She reports feeling overall well.  Denies any pain or discomfort, nausea, SOB or other acute complaints.     Disposition Plan & Communication   Status is: Inpatient  Remains inpatient appropriate because: she is transitioning to hospice care, arrangements underway for home hospice services to start tomorrow.   Dispo: The patient is from: Home              Anticipated d/c is to: Home with hospice              Anticipated d/c date is: 1 day              Patient currently is not medically stable to d/c.   Family Communication: Son at bedside on rounds today, 12/15   Consults, Procedures, Significant Events   Consultants:   Oncology  Palliative Care  Nephrology  Procedures:   None  Antimicrobials:  Anti-infectives (From admission, onward)   Start     Dose/Rate Route Frequency Ordered Stop   07/16/20 1200  ceFEPIme (MAXIPIME) 2 g in sodium chloride 0.9 % 100  mL IVPB  Status:  Discontinued        2 g 200 mL/hr over 30 Minutes Intravenous Every 24 hours 07/16/20 1022 07/19/20 1134   07/13/20 1215  vancomycin (VANCOREADY) IVPB 1750 mg/350 mL        1,750 mg 175 mL/hr over 120 Minutes Intravenous  Once 07/13/20 1208 07/13/20 1617   07/13/20 1200  ceFEPIme (MAXIPIME) 2 g in sodium chloride 0.9 % 100 mL IVPB        2 g 200 mL/hr over 30 Minutes Intravenous  Once 07/13/20 1148 07/13/20 1405         Objective   Vitals:   07/19/20 0422 07/19/20 0840 07/19/20 1234 07/19/20 1654  BP: 98/60 (!) 98/57 101/62 112/76  Pulse: 73 75 76 88  Resp: 19 16 16 16   Temp: 98.5 F (36.9 C) 98.6 F (37 C) 98.1 F (36.7 C) 98.1 F (36.7 C)  TempSrc: Oral   Oral  SpO2: 97% 97% 93% 95%  Weight:      Height:        Intake/Output Summary (Last 24 hours) at 07/19/2020 1855 Last data filed at  07/19/2020 1815 Gross per 24 hour  Intake 452.63 ml  Output 350 ml  Net 102.63 ml   Filed Weights   07/17/20 0421 07/18/20 0405 07/19/20 0209  Weight: 82.2 kg 84.9 kg 84.9 kg    Physical Exam:  General exam: awake, alert, no acute distress, chronically ill appearing Respiratory system: CTAB with diminished bases, normal respiratory effort. Cardiovascular system: normal S1/S2, RRR, no pedal edema.   Central nervous system: A&O x3. no gross focal neurologic deficits, normal speech   Labs   Data Reviewed: I have personally reviewed following labs and imaging studies  CBC: Recent Labs  Lab 07/13/20 0825 07/14/20 0434 07/15/20 0656 07/16/20 0608 07/17/20 0527 07/18/20 0524 07/19/20 0438  WBC 22.3*   < > 22.2* 24.3* 35.0* 23.5* 22.8*  NEUTROABS 19.8*  --   --   --   --   --   --   HGB 8.6*   < > 8.5* 7.3* 8.0* 6.5* 7.9*  HCT 25.0*   < > 25.3* 22.6* 24.8* 19.9* 24.6*  MCV 88.3   < > 89.7 91.5 93.2 91.7 90.8  PLT 358   < > 385 402* 508* 403* 400   < > = values in this interval not displayed.   Basic Metabolic Panel: Recent Labs  Lab 07/15/20 0656 07/16/20 0608 07/16/20 1250 07/17/20 0011 07/17/20 0527 07/17/20 1300 07/18/20 0524 07/19/20 0438  NA 132* 135  --   --  136  --  136 135  K 4.6 4.9  --   --  5.5*  --  5.2* 5.2*  CL 98 101  --   --  103  --  102 103  CO2 22 22  --   --  22  --  22 22  GLUCOSE 67* 85   < > 121* 135* 174* 95 62*  BUN 97* 95*  --   --  93*  --  98* 99*  CREATININE 5.75* 5.29*  --   --  5.27*  --  5.40* 5.40*  CALCIUM 8.5* 8.3*  --   --  9.0  --  8.6* 8.8*   < > = values in this interval not displayed.   GFR: Estimated Creatinine Clearance: 10.8 mL/min (A) (by C-G formula based on SCr of 5.4 mg/dL (H)). Liver Function Tests: Recent Labs  Lab 07/13/20 0825  AST 14*  ALT 12  ALKPHOS 90  BILITOT 0.8  PROT 7.2  ALBUMIN 2.2*   No results for input(s): LIPASE, AMYLASE in the last 168 hours. No results for input(s): AMMONIA in the  last 168 hours. Coagulation Profile: Recent Labs  Lab 07/14/20 0542  INR 1.3*   Cardiac Enzymes: No results for input(s): CKTOTAL, CKMB, CKMBINDEX, TROPONINI in the last 168 hours. BNP (last 3 results) No results for input(s): PROBNP in the last 8760 hours. HbA1C: No results for input(s): HGBA1C in the last 72 hours. CBG: Recent Labs  Lab 07/18/20 2118 07/19/20 0702 07/19/20 0839 07/19/20 1227 07/19/20 1659  GLUCAP 79 55* 76 90 116*   Lipid Profile: No results for input(s): CHOL, HDL, LDLCALC, TRIG, CHOLHDL, LDLDIRECT in the last 72 hours. Thyroid Function Tests: No results for input(s): TSH, T4TOTAL, FREET4, T3FREE, THYROIDAB in the last 72 hours. Anemia Panel: No results for input(s): VITAMINB12, FOLATE, FERRITIN, TIBC, IRON, RETICCTPCT in the last 72 hours. Sepsis Labs: Recent Labs  Lab 07/13/20 1156  LATICACIDVEN 1.5    Recent Results (from the past 240 hour(s))  Resp Panel by RT-PCR (Flu A&B, Covid) Nasopharyngeal Swab     Status: None   Collection Time: 07/13/20 11:56 AM   Specimen: Nasopharyngeal Swab; Nasopharyngeal(NP) swabs in vial transport medium  Result Value Ref Range Status   SARS Coronavirus 2 by RT PCR NEGATIVE NEGATIVE Final    Comment: (NOTE) SARS-CoV-2 target nucleic acids are NOT DETECTED.  The SARS-CoV-2 RNA is generally detectable in upper respiratory specimens during the acute phase of infection. The lowest concentration of SARS-CoV-2 viral copies this assay can detect is 138 copies/mL. A negative result does not preclude SARS-Cov-2 infection and should not be used as the sole basis for treatment or other patient management decisions. A negative result may occur with  improper specimen collection/handling, submission of specimen other than nasopharyngeal swab, presence of viral mutation(s) within the areas targeted by this assay, and inadequate number of viral copies(<138 copies/mL). A negative result must be combined with clinical  observations, patient history, and epidemiological information. The expected result is Negative.  Fact Sheet for Patients:  EntrepreneurPulse.com.au  Fact Sheet for Healthcare Providers:  IncredibleEmployment.be  This test is no t yet approved or cleared by the [place] FDA and  has been authorized for detection and/or diagnosis of SARS-CoV-2 by FDA under an Emergency Use Authorization (EUA). This EUA will remain  in effect (meaning this test can be used) for the duration of the COVID-19 declaration under Section 564(b)(1) of the Act, 21 U.S.C.section 360bbb-3(b)(1), unless the authorization is terminated  or revoked sooner.       Influenza A by PCR NEGATIVE NEGATIVE Final   Influenza B by PCR NEGATIVE NEGATIVE Final    Comment: (NOTE) The Xpert Xpress SARS-CoV-2/FLU/RSV plus assay is intended as an aid in the diagnosis of influenza from Nasopharyngeal swab specimens and should not be used as a sole basis for treatment. Nasal washings and aspirates are unacceptable for Xpert Xpress SARS-CoV-2/FLU/RSV testing.  Fact Sheet for Patients: EntrepreneurPulse.com.au  Fact Sheet for Healthcare Providers: IncredibleEmployment.be  This test is not yet approved or cleared by the [place] FDA and has been authorized for detection and/or diagnosis of SARS-CoV-2 by FDA under an Emergency Use Authorization (EUA). This EUA will remain in effect (meaning this test can be used) for the duration of the COVID-19 declaration under Section 564(b)(1) of the Act, 21 U.S.C. section 360bbb-3(b)(1), unless the authorization is terminated or revoked.  Performed at [facility], [address] [place], [place] [address]   Blood culture (routine x 2)     Status: None   Collection Time: 07/13/20 11:56 AM   Specimen: BLOOD  Result Value Ref Range Status   Specimen Description BLOOD PORTA CATH  Final   Special  Requests   Final    BOTTLES DRAWN AEROBIC AND ANAEROBIC Blood Culture adequate volume   Culture   Final    NO GROWTH 5 DAYS Performed at [facility], [address], [place], [place] [address]    Report Status 07/18/2020 FINAL  Final  Blood culture (routine x 2)     Status: None   Collection Time: 07/13/20 11:56 AM   Specimen: BLOOD  Result Value Ref Range Status   Specimen Description BLOOD LEFT ANTECUBITAL  Final   Special Requests   Final    BOTTLES DRAWN AEROBIC AND ANAEROBIC Blood Culture adequate volume   Culture   Final    NO GROWTH 5 DAYS Performed at [facility], [address] [place], [place] [address]    Report Status 07/18/2020 FINAL  Final  Urine Culture     Status: None   Collection Time: 07/14/20  5:45 AM   Specimen: Urine, Random  Result Value Ref Range Status   Specimen Description   Final    URINE, RANDOM Performed at [facility], [address], [place], [place] [address]    Special Requests   Final    NONE Performed at [facility], [address] [place], [place] [address]    Culture   Final    NO GROWTH Performed at [facility], [place] [address], [place], [place] [address]    Report Status 07/15/2020 FINAL  Final      Imaging Studies   No results found.   Medications   Scheduled Meds:  acetaminophen-codeine  1 tablet Oral BID   And   codeine  30 mg Oral BID   aspirin EC  81 mg Oral Daily   docusate sodium  100 mg Oral Daily   famotidine  20 mg Oral Daily   ferrous sulfate  325 mg Oral Q breakfast   FLUoxetine  20 mg Oral BID   gabapentin  100 mg Oral QHS   heparin  5,000 Units Subcutaneous Q8H   patiromer  16.8 g Oral Daily   simvastatin  20 mg Oral QPM   Continuous Infusions:  dextrose 75 mL/hr at 07/19/20 1202       LOS: 6 days    Time spent: 20 minutes    Ezekiel Slocumb, DO [facility]  07/19/2020, 6:55 PM    If 7PM-7AM, please contact  night-coverage. How to contact the [facility] Attending or Consulting provider [place] or covering provider during after hours [place], for this patient?    1. Check the care team in [facility] and look for a) attending/consulting TR[facility] provider listed and b) the [facility] team listed 2. Log into [email or web address] and use [facility]'s universal password to access. If you do not have the password, please contact the hospital operator. 3. Locate the [facility] provider you are looking for under [facility] and page to a number that you can be directly reached. 4. If you still have difficulty reaching the provider, please page the [facility] (Director on Call) for the Hospitalists listed on amion for assistance.

## 2020-07-19 NOTE — Progress Notes (Signed)
Davonne A Okubo   DOB:1956/06/11   [id number]    Subjective: Patient seems to be mildly confused.  However denies any shortness of breath or chest pain.  Denies any nausea vomiting.  Objective:  Vitals:   07/19/20 0840 07/19/20 1234  BP: (!) 98/57 101/62  Pulse: 75 76  Resp: 16 16  Temp: 98.6 F (37 C) 98.1 F (36.7 C)  SpO2: 97% 93%     Intake/Output Summary (Last 24 hours) at 07/19/2020 1255 Last data filed at 07/19/2020 0045 Gross per 24 hour  Intake 392.63 ml  Output --  Net 392.63 ml    Physical Exam Constitutional:      Comments: Patient sitting in the chair.  No acute distress.  On oxygen.  HENT:     Head: Normocephalic and atraumatic.     Mouth/Throat:     Mouth: Oropharynx is clear and moist.     Pharynx: No oropharyngeal exudate.  Eyes:     Pupils: Pupils are equal, round, and reactive to light.  Cardiovascular:     Rate and Rhythm: Normal rate and regular rhythm.  Pulmonary:     Effort: No respiratory distress.     Breath sounds: No wheezing.     Comments: Decreased air entry bilaterally. Abdominal:     General: Bowel sounds are normal. There is no distension.     Palpations: Abdomen is soft. There is no mass.     Tenderness: There is no abdominal tenderness. There is no guarding or rebound.  Musculoskeletal:        General: No tenderness or edema. Normal range of motion.     Cervical back: Normal range of motion and neck supple.     Comments: Bilateral lower extremity swelling; left lower extremity bandaged.  Skin:    General: Skin is warm.  Neurological:     Mental Status: She is alert.     Comments: Oriented x2-3.  Psychiatric:        Mood and Affect: Affect normal.      Labs:  Lab Results  Component Value Date   WBC 22.8 (H) 07/19/2020   HGB 7.9 (L) 07/19/2020   HCT 24.6 (L) 07/19/2020   MCV 90.8 07/19/2020   PLT 400 07/19/2020   NEUTROABS 19.8 (H) 07/13/2020    Lab Results  Component Value Date   NA 135 07/19/2020   K 5.2 (H)  07/19/2020   CL 103 07/19/2020   CO2 22 07/19/2020    Studies:  No results found.  Carcinoma of upper-inner quadrant of left breast in female, estrogen receptor positive ([place]) 64 year old female patient with a history of metastatic/stage IV breast cancer multiple other medical problems including chronic kidney disease stage IV, poorly controlled diabetes; diabetic foot ulcer-is currently admitted to hospital for hypotension/leukocytosis and acute renal failure.  # Stage IV breast cancer ER/PR positive HER-2 negative - October 30 CT scan-progression of disease-pleural metastases/bilateral effusion; worsening lytic lesions in the bones;  Currently on weekly DOXIL #3 so far; clinically worsening discontinue chemotherapy.  #Acute on chronic kidney disease-creatinine today 5.5; stable [ from 6.5 on admission]; patient is  poor candidate for dialysis.  No significant improvement noted in the renal function;  potassium is 5.2 today-   # Leukocytosis-close infection empiric antibiotics; leukocytosis -improving continue antibiotics.  # Anemia- sec to chemo-CKD- IV-hemoglobin 6.5 s/p PRBC transfusion 7.8 today.  #DNR/DNI.  Discussed with the patient regarding disposition/discharging home with hospice. Discussed with [employer].      Lenetta Quaker R  Rogue Bussing, MD 07/19/2020  12:55 PM

## 2020-07-19 NOTE — Progress Notes (Addendum)
[facility]) Hospital Liaison RN note:  Received request from Dr. Rogue Bussing for hospice services at home after discharge. Chart and patient information under review by [facility] physician. Hospice eligibility has been approved.  Spoke with patient and significant other, [name], in the room to initiate education related to hospice philosophy, services and to answer questions. Patient and [name] verbalized understanding and all questions were answered. Plan is for discharge tomorrow by private vehicle.  DME needs discussed. Patient already has a walker, BSC, showerbench and O2 at 4L with [employer]. No other DME needs at present. Address has been verified and is correct in the chart. Contact is [name] and his number is [phone].  Please send signed and completed DNR home with patient. Please provide prescriptions at discharge as needed to ensure ongoing symptom management.  [facility] information and contact numbers given to patient. Above information shared with Awanya Caesar, TOC.  Please call with any hospice related questions or concerns.  Thank you for the opportunity to participate in this patient's care.  Loney Laurence [facility] Liaison [phone]

## 2020-07-19 NOTE — Progress Notes (Signed)
PT Cancellation Note  Patient Details Name: [name] MRN: [id number] DOB: 01-Dec-1955   Cancelled Treatment:    Reason Eval/Treat Not Completed: Other (comment). Per chart review, pt to discharge home with hospice. PT to sign off.   Lieutenant Diego PT, DPT 2:21 PM,07/19/20

## 2020-07-19 NOTE — Progress Notes (Signed)
OT Cancellation Note  Patient Details Name: VASILISA VORE MRN: [id number] DOB: September 28, 1955   Cancelled Treatment:    Reason Eval/Treat Not Completed: Other (comment).Per chart review, pt to discharge home with hospice. OT to sign off.  Darleen Crocker, MS, OTR/L , CBIS ascom [phone]  07/19/20, 2:45 PM   07/19/2020, 2:45 PM

## 2020-07-20 ENCOUNTER — Inpatient Hospital Stay: Payer: Medicare HMO | Admitting: Internal Medicine

## 2020-07-20 ENCOUNTER — Inpatient Hospital Stay: Payer: Medicare HMO

## 2020-07-20 LAB — URINALYSIS, ROUTINE W REFLEX MICROSCOPIC
Bacteria, UA: NONE SEEN
Bilirubin Urine: NEGATIVE
Glucose, UA: NEGATIVE mg/dL
Hgb urine dipstick: NEGATIVE
Ketones, ur: NEGATIVE mg/dL
Nitrite: NEGATIVE
Protein, ur: 30 mg/dL — AB
Specific Gravity, Urine: 1.013 (ref 1.005–1.030)
pH: 5 (ref 5.0–8.0)

## 2020-07-20 LAB — GLUCOSE, RANDOM: Glucose, Bld: 155 mg/dL — ABNORMAL HIGH (ref 70–99)

## 2020-07-20 LAB — GLUCOSE, CAPILLARY
Glucose-Capillary: 139 mg/dL — ABNORMAL HIGH (ref 70–99)
Glucose-Capillary: 153 mg/dL — ABNORMAL HIGH (ref 70–99)

## 2020-07-20 MED ORDER — ONDANSETRON 8 MG PO TBDP: 8.0000 mg | ORAL_TABLET | Freq: Three times a day (TID) | ORAL | 0 refills | Status: AC | PRN

## 2020-07-20 MED ORDER — MORPHINE SULFATE 20 MG/5ML PO SOLN: 5.0000 mg | ORAL | 0 refills | Status: AC | PRN

## 2020-07-20 MED ORDER — PROCHLORPERAZINE EDISYLATE 10 MG/2ML IJ SOLN
10.0000 mg | Freq: Four times a day (QID) | INTRAMUSCULAR | Status: DC | PRN
  Filled 2020-07-20: qty 2

## 2020-07-20 MED ORDER — LORAZEPAM 2 MG/ML PO CONC: 1.0000 mg | Freq: Four times a day (QID) | ORAL | 0 refills | Status: AC | PRN

## 2020-07-20 NOTE — Plan of Care (Signed)
°  Problem: Education: Goal: Knowledge of General Education information will improve Description: Including pain rating scale, medication(s)/side effects and non-pharmacologic comfort measures Outcome: Adequate for Discharge   Problem: Health Behavior/Discharge Planning: Goal: Ability to manage health-related needs will improve Outcome: Adequate for Discharge   Problem: Clinical Measurements: Goal: Ability to maintain clinical measurements within normal limits will improve Outcome: Adequate for Discharge Goal: Will remain free from infection Outcome: Adequate for Discharge Goal: Diagnostic test results will improve Outcome: Adequate for Discharge Goal: Respiratory complications will improve Outcome: Adequate for Discharge Goal: Cardiovascular complication will be avoided Outcome: Adequate for Discharge   Problem: Activity: Goal: Risk for activity intolerance will decrease Outcome: Adequate for Discharge   Problem: Nutrition: Goal: Adequate nutrition will be maintained Outcome: Adequate for Discharge   Problem: Coping: Goal: Level of anxiety will decrease Outcome: Adequate for Discharge   Problem: Elimination: Goal: Will not experience complications related to bowel motility Outcome: Adequate for Discharge Goal: Will not experience complications related to urinary retention Outcome: Adequate for Discharge   Problem: Pain Managment: Goal: General experience of comfort will improve Outcome: Adequate for Discharge   Problem: Safety: Goal: Ability to remain free from injury will improve Outcome: Adequate for Discharge   Problem: Skin Integrity: Goal: Risk for impaired skin integrity will decrease Outcome: Adequate for Discharge   Problem: Education: Goal: Knowledge of disease and its progression will improve Outcome: Adequate for Discharge Goal: Individualized Educational Video(s) Outcome: Adequate for Discharge   Problem: Fluid Volume: Goal: Compliance with  measures to maintain balanced fluid volume will improve Outcome: Adequate for Discharge   Problem: Health Behavior/Discharge Planning: Goal: Ability to manage health-related needs will improve Outcome: Adequate for Discharge   Problem: Nutritional: Goal: Ability to make healthy dietary choices will improve Outcome: Adequate for Discharge   Problem: Clinical Measurements: Goal: Complications related to the disease process, condition or treatment will be avoided or minimized Outcome: Adequate for Discharge

## 2020-07-20 NOTE — Progress Notes (Signed)
[facility]) Hospital Liaison RN note:  Visited with patient in room. Plan is discharge home today by EMS. No DME needs requested.   Please send signed and completed DNR home with patient. Please provide prescriptions at discharge as needed to ensure ongoing symptom management. I will send the discharge summary to [facility]erral when available.  Thank you for the opportunity to participate in this patient's care.  Zandra Abts, RN [facility] Liaison [phone]

## 2020-07-20 NOTE — Progress Notes (Signed)
Icelyn A Mcleish   DOB:[birth year]   [id number]    Subjective: No acute events overnight.  Denies shortness of breath.  No fevers.  No chills.  Objective:  Vitals:   07/20/20 0908 07/20/20 1130  BP: 132/72 130/77  Pulse: 86 87  Resp: 19 18  Temp: (!) 97.5 F (36.4 C) 97.6 F (36.4 C)  SpO2: 92% 93%     Intake/Output Summary (Last 24 hours) at 07/20/2020 1931 Last data filed at 07/20/2020 1130 Gross per 24 hour  Intake 1102.46 ml  Output 850 ml  Net 252.46 ml    Physical Exam HENT:     Head: Normocephalic and atraumatic.     Mouth/Throat:     Mouth: Oropharynx is clear and moist.     Pharynx: No oropharyngeal exudate.  Eyes:     Pupils: Pupils are equal, round, and reactive to light.  Cardiovascular:     Rate and Rhythm: Normal rate and regular rhythm.  Pulmonary:     Effort: No respiratory distress.     Breath sounds: No wheezing.     Comments: Patient appears to be in mild respiratory distress.  Decreased breath sounds bilaterally. Abdominal:     General: Bowel sounds are normal. There is no distension.     Palpations: Abdomen is soft. There is no mass.     Tenderness: There is no abdominal tenderness. There is no guarding or rebound.  Musculoskeletal:        General: No tenderness or edema. Normal range of motion.     Cervical back: Normal range of motion and neck supple.     Comments: Positive for lower extremity swelling.  Skin:    General: Skin is warm.  Neurological:     Mental Status: She is alert.     Comments: Oriented x2-3.  Psychiatric:        Mood and Affect: Affect normal.     Labs:  Lab Results  Component Value Date   WBC 22.8 (H) 07/19/2020   HGB 7.9 (L) 07/19/2020   HCT 24.6 (L) 07/19/2020   MCV 90.8 07/19/2020   PLT 400 07/19/2020   NEUTROABS 19.8 (H) 07/13/2020    Lab Results  Component Value Date   NA 135 07/19/2020   K 5.2 (H) 07/19/2020   CL 103 07/19/2020   CO2 22 07/19/2020    Studies:  CT HEAD WO CONTRAST  Result  Date: 07/19/2020 CLINICAL DATA:  Mental status change.  Unknown cause. EXAM: CT HEAD WITHOUT CONTRAST TECHNIQUE: Contiguous axial images were obtained from the base of the skull through the vertex without intravenous contrast. COMPARISON:  CT head 03/01/2020 FINDINGS: Brain: Patchy and confluent areas of decreased attenuation are noted throughout the deep and periventricular white matter of the cerebral hemispheres bilaterally, compatible with chronic microvascular ischemic disease. No evidence of large-territorial acute infarction. No parenchymal hemorrhage. No mass lesion. No extra-axial collection. No mass effect or midline shift. No hydrocephalus. Basilar cisterns are patent. Vascular: No hyperdense vessel. Skull: No acute fracture or focal lesion. Sinuses/Orbits: Mild mucosal thickening of left maxillary sinus. Otherwise paranasal sinuses and mastoid air cells are clear. The orbits are unremarkable. Other: None. IMPRESSION: No acute intracranial abnormality. Electronically Signed   By: Iven Finn M.D.   On: 07/19/2020 22:43    Carcinoma of upper-inner quadrant of left breast in female, estrogen receptor positive ([place]) 64 year old female patient with a history of metastatic/stage IV breast cancer multiple other medical problems including chronic kidney disease stage IV, poorly controlled  diabetes; diabetic foot ulcer-is currently admitted to hospital for hypotension/leukocytosis and acute renal failure.  # Stage IV breast cancer ER/PR positive HER-2 negative - October 30 CT scan-progression of disease-pleural metastases/bilateral effusion; worsening lytic lesions in the bones;  Currently on weekly DOXIL #3 so far; clinically worsening discontinue chemotherapy.  #Acute on chronic kidney disease-creatinine today 5.5; stable [ from 6.5 on admission]; patient is  poor candidate for dialysis-no significant improvement in renal function.  Discussed with nephrology.  # Leukocytosis-close infection  empiric antibiotics.  #DNR/DNI; discussed with the patient regarding discharging home on hospice.   Patient aware of the plan.  Patient agreement.  Cammie Sickle, MD 07/20/2020  7:31 PM

## 2020-07-20 NOTE — TOC Transition Note (Signed)
Transition of Care [facility]) - CM/SW Discharge Note   Patient Details  Name: [name] MRN: [id number] Date of Birth: [birth year]  Transition of Care [facility]) CM/SW Contact:  Kerin Salen, RN Phone Number: 07/20/2020, 12:07 PM   Clinical Narrative:  Palliative Care with [place]. No other TOC needs at this time.     Final next level of care: Home w Hospice Care Barriers to Discharge: Barriers Resolved   Patient Goals and CMS Choice Patient states their goals for this hospitalization and ongoing recovery are:: Home with   Choice offered to / list presented to : NA  Discharge Placement                Patient to be transferred to facility by: [facility] [place] [phone] Name of family member notified: Lucita Lora Patient and family notified of of transfer: 07/20/20  Discharge Plan and Services                DME Arranged: [place] C DME Agency: [facility] Date DME Agency Contacted: 07/20/20 Time DME Agency Contacted: [id number] Representative spoke with at DME Agency: [place]: NA [place] Agency: NA        Social Determinants of Health ([place]) Interventions     Readmission Risk Interventions Readmission Risk Prevention Plan 07/15/2020 06/04/2020 04/01/2020  Transportation Screening Complete Complete Complete  Medication Review [employer]) Complete Complete Complete  PCP or Specialist appointment within 3-5 days of discharge Complete Complete Complete  HRI or Home Care Consult Complete Complete Complete  SW Recovery Care/Counseling Consult Complete Complete Complete  Palliative Care Screening Not Applicable Not Applicable Not [place] Not Applicable Not Applicable Not Applicable  Some recent data might be hidden

## 2020-07-20 NOTE — Discharge Summary (Signed)
Physician Discharge Summary  [name] EXH:[id number] DOB: 1955-08-19 DOA: 07/13/2020  PCP: [name], MD  Admit date: 07/13/2020 Discharge date: 07/20/2020  Admitted From: home Disposition:  Home with hospice  Recommendations for Outpatient Follow-up:  1. Follow up with home hospice  Home Health: hospice  Equipment/Devices: none   Discharge Condition: stable CODE STATUS: DNR  Diet recommendation: regular, as tolerated, per comfort   Discharge Diagnoses: Principal Problem:   AKI (acute kidney injury) ([place]) Active Problems:   Benign essential HTN   Anxiety and depression   Type 2 diabetes mellitus with diabetic chronic kidney disease ([place])   Anemia of chronic disease   Metastatic breast cancer ([place])   Antineoplastic chemotherapy induced anemia   Weakness generalized   Malignant pleural effusion   Leukocytosis   Hyponatremia   Pressure injury of skin    Summary of HPI and Hospital Course:  From note of 12/14 by Dr. Si Raider: "[name] is a 64 y.o. female with medical history significant for stage IV breast cancer on chemotherapy, stage IV chronic kidney disease, depression, chronic respiratory failure, hypertension, GERD who was sent to the emergency room from the cancer center for evaluation of abnormal labs.  Patient had gone to the cancer center for chemotherapy and labs drawn showed worsening of her renal function from baseline.  At baseline she has a serum creatinine of 2.4 but today on admission her serum creatinine was 6.5.  Patient states that she cut back on her fluid intake because she recently had thoracentesis done in the hospital with drainage of about a liter of fluid from both lungs.  She states that her mouth stays dry but denies having any urinary frequency or nocturia.  She denies feeling dizzy or lightheaded and had a blood pressure in the 80s at the cancer center for which she received 1 L of IV fluid. She has a cough productive of clear  phlegm. 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  Followed by Dr. Rogue Bussing who is consulted. PET CT of 03/28/2020, with associated evidence of visceral/parietal pleural metastases.Re demonstrationofskeletal metastases, with new/enlarging lytic lesions of the thoracic spine compatible with progression.The left breast mass appears to be enlarging compared to the priorPET. Status post thoracentesis about 4 weeks ago with about 1 L fluid drained off.   Respiratory status stable. Overall poor prognosis. Discharge home today with hospice services   AKI superimposed on CKD stage IV Hyperkalemia AKI likely secondary to prerenal azotemia in the setting of poor p.o. intake and overdiuresis.  CKD secondary to hypertension, diabetes and chemotherapy.  Baseline creatinine around 2.4, creatinine was 6.5 on admission. Renal ultrasound on 12/10 - for hydronephrosis, kidneys with signs of medical renal disease. -Nephrology was consulted Patient does not wish to undergo dialysis and would be poor candidate Palliative consulted, pt agreed to hospice No further labs  Chronic respiratory failure with hypoxia -Baseline 3 L/min, currently on 4.  Secondary to metastatic breast cancer malignant pleural effusions.  Continue supplemental O2 as needed and for comfort.  Leukocytosis Recent left foot osteomyelitis -recently admitted, status post partial ray amputation and IV antibiotics which have been completed.  Has been followed by wound care and wound reports to be healing well.  No evidence of worsening infection at this point.  Chest x-ray without  any obvious infiltrate and respiratory status is stable unlikely a pneumonia.  No dysuria or GU symptoms, abdominal pain nausea  vomiting. --Stop Cefepime since hospice d/c  --Wound care  Hyponatremia -most likely was hypovolemic, improved with IV hydration  Type 2 diabetes / hypoglycemia - required D5w infusion during admission  Chronic anemia of renal disease -and possibly due to chemotherapy as well.  stopped iron supplement.    Hypertension -presented hypotensive and now blood pressures within normal.   Stopped medications given hospice / comfort status.  Depression/anxiety -continue fluoxetine and alprazolam  Chronic pain - due to metastatic disease.  Continue home gabapentin.  Also has codeine, tylenol, PRN morphine  History of CVA -stopped aspirin and statin   Constipation -continue home Colace and senna  History of diastolic CHF -hold Bumex  Stage III sacral decubitus ulcer -present on admission Pressure Injury 07/14/20 Sacrum Mid Stage 2 -  Partial thickness loss of dermis presenting as a shallow open injury with a red, pink wound bed without slough. (Active)  07/14/20 1405  Location: Sacrum  Location Orientation: Mid  Staging: Stage 2 -  Partial thickness loss of dermis presenting as a shallow open injury with a red, pink wound bed without slough.  Wound Description (Comments):   Present on Admission:      Pressure Injury 07/14/20 Heel Posterior;Right Deep Tissue Pressure Injury - Purple or maroon localized area of discolored intact skin or blood-filled blister due to damage of underlying soft tissue from pressure and/or shear. (Active)  07/14/20 1406  Location: Heel  Location Orientation: Posterior;Right  Staging: Deep Tissue Pressure Injury - Purple or maroon localized area of discolored intact skin or blood-filled blister due to damage of underlying soft tissue from pressure and/or shear.  Wound Description (Comments):   Present on Admission: Yes    Left foot ulcer -wound care consulted, no sign of infection at time of admission.  Monitor.     Discharge Instructions    Discharge Instructions    Call MD for:   Complete by: As directed    Uncontrolled pain, anxiety, agitation, nausea/vomiting   Call MD for:  severe uncontrolled pain   Complete by: As directed    Care order/instruction   Complete by: As directed    Transfuse Parameters   Diet - low sodium heart healthy   Complete by: As directed    Increase activity slowly   Complete by: As directed    Informed Consent Details: Physician/Practitioner Attestation; Transcribe to consent form and obtain patient signature   Complete by: As directed    Physician/Practitioner attestation of informed consent for blood and or blood product transfusion: I, the physician/practitioner, attest that I have discussed with the patient the benefits, risks, side effects, alternatives, likelihood of achieving goals and potential problems during recovery for the procedure that I have provided informed consent.   Product(s): All Product(s)   No wound care   Complete by: As directed    Type and screen   Complete by: As directed    [place]      Allergies as of 07/20/2020      Reactions   Fish-derived Products Anaphylaxis   Sulfamethoxazole-trimethoprim Other (See Comments)   Caused kidney issues   Chlorhexidine       Medication List    STOP taking these medications   amLODipine 5 MG tablet Commonly known as: NORVASC   aspirin EC 81 MG tablet   atenolol 100 MG tablet Commonly known as: TENORMIN  bumetanide 1 MG tablet Commonly known as: BUMEX   calcitRIOL 0.25 MCG capsule Commonly known as: ROCALTROL   Cinnamon 500 MG capsule   cloNIDine 0.1 MG tablet Commonly known as: CATAPRES   docusate sodium 100 MG capsule Commonly known as: COLACE   enalapril 10 MG tablet Commonly known as: VASOTEC   ferrous sulfate 325 (65 FE) MG tablet   glyBURIDE 5 MG tablet Commonly known as: DIABETA   Levemir FlexTouch 100 UNIT/ML FlexPen Generic drug: insulin detemir   NovoLOG  FlexPen 100 UNIT/ML FlexPen Generic drug: insulin aspart   simvastatin 20 MG tablet Commonly known as: ZOCOR     TAKE these medications   acetaminophen-codeine 300-60 MG tablet Commonly known as: TYLENOL #4 Take 1 tablet by mouth 2 (two) times daily as needed for moderate pain.   albuterol 108 (90 Base) MCG/ACT inhaler Commonly known as: VENTOLIN HFA Inhale 2 puffs into the lungs every 6 (six) hours as needed for wheezing or shortness of breath.   ALPRAZolam 0.5 MG tablet Commonly known as: XANAX Take 0.5 mg by mouth 2 (two) times daily as needed for anxiety or sleep.   famotidine 20 MG tablet Commonly known as: PEPCID Take 20 mg by mouth 2 (two) times daily.   FLUoxetine 20 MG capsule Commonly known as: PROZAC Take 20 mg by mouth 2 (two) times daily.   gabapentin 100 MG capsule Commonly known as: NEURONTIN TAKE 1 CAPSULE(100 MG) BY MOUTH AT BEDTIME What changed:   how much to take  how to take this  when to take this  additional instructions   LORazepam 2 MG/ML concentrated solution Commonly known as: ATIVAN Take 0.5 mLs (1 mg total) by mouth every 6 (six) hours as needed for anxiety (agitation, sleep).   morphine 20 MG/5ML solution Take 1.3 mLs (5.2 mg total) by mouth every 2 (two) hours as needed for pain.   ondansetron 8 MG disintegrating tablet Commonly known as: Zofran ODT Take 1 tablet (8 mg total) by mouth every 8 (eight) hours as needed for nausea or vomiting.   senna 8.6 MG Tabs tablet Commonly known as: SENOKOT Take 1 tablet (8.6 mg total) by mouth 2 (two) times daily as needed for mild constipation or moderate constipation.       Allergies  Allergen Reactions  . Fish-Derived Products Anaphylaxis  . Sulfamethoxazole-Trimethoprim Other (See Comments)    Caused kidney issues  . Chlorhexidine     Consultations:  Palliative Care  Oncology  Nephrology    Procedures/Studies: CT HEAD WO CONTRAST  Result Date: 07/19/2020 CLINICAL  DATA:  Mental status change.  Unknown cause. EXAM: CT HEAD WITHOUT CONTRAST TECHNIQUE: Contiguous axial images were obtained from the base of the skull through the vertex without intravenous contrast. COMPARISON:  CT head 03/01/2020 FINDINGS: Brain: Patchy and confluent areas of decreased attenuation are noted throughout the deep and periventricular white matter of the cerebral hemispheres bilaterally, compatible with chronic microvascular ischemic disease. No evidence of large-territorial acute infarction. No parenchymal hemorrhage. No mass lesion. No extra-axial collection. No mass effect or midline shift. No hydrocephalus. Basilar cisterns are patent. Vascular: No hyperdense vessel. Skull: No acute fracture or focal lesion. Sinuses/Orbits: Mild mucosal thickening of left maxillary sinus. Otherwise paranasal sinuses and mastoid air cells are clear. The orbits are unremarkable. Other: None. IMPRESSION: No acute intracranial abnormality. Electronically Signed   By: Iven Finn M.D.   On: 07/19/2020 22:43   US Renal  Result Date: 07/13/2020 CLINICAL DATA:  64 year old female with acute  kidney injury EXAM: RENAL / URINARY TRACT ULTRASOUND COMPLETE COMPARISON:  None. FINDINGS: Right Kidney: Renal measurements: 10.6 x 4.7 x 4.6 cm = volume: 120 mL. The renal parenchyma is increased in echogenicity. There is also increased conspicuity between the corticomedullary interface. Circumscribed anechoic simple cyst with imperceptible walls are present. There are 2 measured at 1.9 x 1.8 x 1.9 cm and 1.7 x 1.3 x 1.4 cm. The larger cyst is present exophytic from the lower pole while the smaller cyst is partially exophytic from the interpolar region. No hydronephrosis or nephrolithiasis. Left Kidney: Renal measurements: 10.6 x 5.1 x 4.1 cm = volume: 117 mL. Increased parenchymal echogenicity. No hydronephrosis or nephrolithiasis. Tiny anechoic simple cyst measures 0.8 x 0.8 x 0.6 cm. Bladder: Appears normal for degree of  bladder distention. Other: None. IMPRESSION: 1. No evidence of hydronephrosis. 2. Echogenic renal parenchyma bilaterally consistent with medical renal disease. 3. Simple renal cysts bilaterally. Electronically Signed   By: Jacqulynn Cadet M.D.   On: 07/13/2020 13:09   DG Chest Port 1 View  Result Date: 07/13/2020 CLINICAL DATA:  Questionable sepsis. EXAM: PORTABLE CHEST 1 VIEW COMPARISON:  06/05/2020 and older exams. FINDINGS: There is bilateral lung base opacity obscuring hemidiaphragms consistent with a combination of pleural effusions and atelectasis and/or infection. Upper lungs remain clear. No pneumothorax. Cardiac silhouette is normal in size.  No mediastinal masses. Right anterior chest wall Port-A-Cath is stable. Skeletal structures are grossly intact. IMPRESSION: 1. Bilateral pleural effusions with associated lung base opacity, similar to the prior chest radiograph. Lung base opacities may be due to atelectasis, pneumonia or a combination. No convincing pulmonary edema. No pneumothorax. Electronically Signed   By: Lajean Manes M.D.   On: 07/13/2020 12:10       Subjective: Pt awake in bed this AM.  Had more confusion last night, head CT was obtained and negative for anything acute.  This AM pt reports feeling generally more weak and tired, but no other specific acute complaints.   Discharge Exam: Vitals:   07/20/20 0908 07/20/20 1130  BP: 132/72 130/77  Pulse: 86 87  Resp: 19 18  Temp: (!) 97.5 F (36.4 C) 97.6 F (36.4 C)  SpO2: 92% 93%   Vitals:   07/20/20 0215 07/20/20 0349 07/20/20 0908 07/20/20 1130  BP:  119/75 132/72 130/77  Pulse:  84 86 87  Resp:  19 19 18   Temp:  98 F (36.7 C) (!) 97.5 F (36.4 C) 97.6 F (36.4 C)  TempSrc:  Oral Oral   SpO2:  94% 92% 93%  Weight: 84.7 kg     Height:        General: Pt is alert, awake, not in acute distress Cardiovascular: RRR, S1/S2 +, no rubs, no gallops Respiratory: CTA bilaterally diminished bases, no wheezing, no  rhonchi Abdominal: Soft, NT, ND, bowel sounds + Extremities: no edema, no cyanosis    The results of significant diagnostics from this hospitalization (including imaging, microbiology, ancillary and laboratory) are listed below for reference.     Microbiology: Recent Results (from the past 240 hour(s))  Resp Panel by RT-PCR (Flu A&B, Covid) Nasopharyngeal Swab     Status: None   Collection Time: 07/13/20 11:56 AM   Specimen: Nasopharyngeal Swab; Nasopharyngeal(NP) swabs in vial transport medium  Result Value Ref Range Status   SARS Coronavirus 2 by RT PCR NEGATIVE NEGATIVE Final    Comment: (NOTE) SARS-CoV-2 target nucleic acids are NOT DETECTED.  The SARS-CoV-2 RNA is generally detectable in upper respiratory  specimens during the acute phase of infection. The lowest concentration of SARS-CoV-2 viral copies this assay can detect is 138 copies/mL. A negative result does not preclude SARS-Cov-2 infection and should not be used as the sole basis for treatment or other patient management decisions. A negative result may occur with  improper specimen collection/handling, submission of specimen other than nasopharyngeal swab, presence of viral mutation(s) within the areas targeted by this assay, and inadequate number of viral copies(<138 copies/mL). A negative result must be combined with clinical observations, patient history, and epidemiological information. The expected result is Negative.  Fact Sheet for Patients:  EntrepreneurPulse.com.au  Fact Sheet for Healthcare Providers:  IncredibleEmployment.be  This test is no t yet approved or cleared by the [place] FDA and  has been authorized for detection and/or diagnosis of SARS-CoV-2 by FDA under an Emergency Use Authorization (EUA). This EUA will remain  in effect (meaning this test can be used) for the duration of the COVID-19 declaration under Section 564(b)(1) of the Act,  21 U.S.C.section 360bbb-3(b)(1), unless the authorization is terminated  or revoked sooner.       Influenza A by PCR NEGATIVE NEGATIVE Final   Influenza B by PCR NEGATIVE NEGATIVE Final    Comment: (NOTE) The Xpert Xpress SARS-CoV-2/FLU/RSV plus assay is intended as an aid in the diagnosis of influenza from Nasopharyngeal swab specimens and should not be used as a sole basis for treatment. Nasal washings and aspirates are unacceptable for Xpert Xpress SARS-CoV-2/FLU/RSV testing.  Fact Sheet for Patients: EntrepreneurPulse.com.au  Fact Sheet for Healthcare Providers: IncredibleEmployment.be  This test is not yet approved or cleared by the [place] FDA and has been authorized for detection and/or diagnosis of SARS-CoV-2 by FDA under an Emergency Use Authorization (EUA). This EUA will remain in effect (meaning this test can be used) for the duration of the COVID-19 declaration under Section 564(b)(1) of the Act, 21 U.S.C. section 360bbb-3(b)(1), unless the authorization is terminated or revoked.  Performed at [facility], [address] [place], [place] [address]   Blood culture (routine x 2)     Status: None   Collection Time: 07/13/20 11:56 AM   Specimen: BLOOD  Result Value Ref Range Status   Specimen Description BLOOD PORTA CATH  Final   Special Requests   Final    BOTTLES DRAWN AEROBIC AND ANAEROBIC Blood Culture adequate volume   Culture   Final    NO GROWTH 5 DAYS Performed at [facility], [address] [place], [place] [address]    Report Status 07/18/2020 FINAL  Final  Blood culture (routine x 2)     Status: None   Collection Time: 07/13/20 11:56 AM   Specimen: BLOOD  Result Value Ref Range Status   Specimen Description BLOOD LEFT ANTECUBITAL  Final   Special Requests   Final    BOTTLES DRAWN AEROBIC AND ANAEROBIC Blood Culture adequate volume   Culture   Final    NO GROWTH 5 DAYS Performed at  [facility], [address], [place], [place] [address]    Report Status 07/18/2020 FINAL  Final  Urine Culture     Status: None   Collection Time: 07/14/20  5:45 AM   Specimen: Urine, Random  Result Value Ref Range Status   Specimen Description   Final    URINE, RANDOM Performed at [facility], [address], [place],  [address]    Special Requests   Final    NONE Performed at  [facility], [address], [place], [place] [address]    Culture   Final    NO GROWTH Performed at [facility], [place] [address] [place], [place] [address]    Report Status 07/15/2020 FINAL  Final     Labs: BNP (last 3 results) Recent Labs    09/27/19 1406 06/02/20 1048  BNP 60.0 154.0*   Basic Metabolic Panel: Recent Labs  Lab 07/15/20 0656 07/16/20 0608 07/16/20 1250 07/17/20 0527 07/17/20 1300 07/18/20 0524 07/19/20 0438 07/20/20 0640  NA 132* 135  --  136  --  136 135  --   K 4.6 4.9  --  5.5*  --  5.2* 5.2*  --   CL 98 101  --  103  --  102 103  --   CO2 22 22  --  22  --  22 22  --   GLUCOSE 67* 85   < > 135* 174* 95 62* 155*  BUN 97* 95*  --  93*  --  98* 99*  --   CREATININE 5.75* 5.29*  --  5.27*  --  5.40* 5.40*  --   CALCIUM 8.5* 8.3*  --  9.0  --  8.6* 8.8*  --    < > = values in this interval not displayed.   Liver Function Tests: No results for input(s): AST, ALT, ALKPHOS, BILITOT, PROT, ALBUMIN in the last 168 hours. No results for input(s): LIPASE, AMYLASE in the last 168 hours. No results for input(s): AMMONIA in the last 168 hours. CBC: Recent Labs  Lab 07/15/20 0656 07/16/20 0608 07/17/20 0527 07/18/20 0524 07/19/20 0438  WBC 22.2* 24.3* 35.0* 23.5* 22.8*  HGB 8.5* 7.3* 8.0* 6.5* 7.9*  HCT 25.3* 22.6* 24.8* 19.9* 24.6*  MCV 89.7 91.5 93.2 91.7 90.8  PLT 385 402* 508* 403* 400   Cardiac Enzymes: No results for input(s): CKTOTAL, CKMB, CKMBINDEX, TROPONINI in the last 168 hours. BNP: Invalid input(s):  POCBNP CBG: Recent Labs  Lab 07/19/20 1659 07/19/20 1951 07/19/20 2151 07/20/20 0905 07/20/20 1141  GLUCAP 116* 120* 111* 139* 153*   D-Dimer No results for input(s): DDIMER in the last 72 hours. Hgb A1c No results for input(s): HGBA1C in the last 72 hours. Lipid Profile No results for input(s): CHOL, HDL, LDLCALC, TRIG, CHOLHDL, LDLDIRECT in the last 72 hours. Thyroid function studies No results for input(s): TSH, T4TOTAL, T3FREE, THYROIDAB in the last 72 hours.  Invalid input(s): FREET3 Anemia work up No results for input(s): VITAMINB12, FOLATE, FERRITIN, TIBC, IRON, RETICCTPCT in the last 72 hours. Urinalysis    Component Value Date/Time   COLORURINE YELLOW (A) 07/20/2020 0204   APPEARANCEUR HAZY (A) 07/20/2020 0204   LABSPEC 1.013 07/20/2020 0204   PHURINE 5.0 07/20/2020 0204   GLUCOSEU NEGATIVE 07/20/2020 0204   HGBUR NEGATIVE 07/20/2020 0204   BILIRUBINUR NEGATIVE 07/20/2020 0204   KETONESUR NEGATIVE 07/20/2020 0204   PROTEINUR 30 (A) 07/20/2020 0204   NITRITE NEGATIVE 07/20/2020 0204   LEUKOCYTESUR SMALL (A) 07/20/2020 0204   Sepsis Labs Invalid input(s): PROCALCITONIN,  WBC,  LACTICIDVEN Microbiology Recent Results (from the past 240 hour(s))  Resp Panel by RT-PCR (Flu A&B, Covid) Nasopharyngeal Swab     Status: None   Collection Time: 07/13/20 11:56 AM   Specimen: Nasopharyngeal Swab; Nasopharyngeal(NP) swabs in vial transport medium  Result Value Ref Range Status   SARS Coronavirus 2 by RT PCR NEGATIVE NEGATIVE Final    Comment: (NOTE) SARS-CoV-2 target nucleic acids are NOT  DETECTED.  The SARS-CoV-2 RNA is generally detectable in upper respiratory specimens during the acute phase of infection. The lowest concentration of SARS-CoV-2 viral copies this assay can detect is 138 copies/mL. A negative result does not preclude SARS-Cov-2 infection and should not be used as the sole basis for treatment or other patient management decisions. A negative result  may occur with  improper specimen collection/handling, submission of specimen other than nasopharyngeal swab, presence of viral mutation(s) within the areas targeted by this assay, and inadequate number of viral copies(<138 copies/mL). A negative result must be combined with clinical observations, patient history, and epidemiological information. The expected result is Negative.  Fact Sheet for Patients:  EntrepreneurPulse.com.au  Fact Sheet for Healthcare Providers:  IncredibleEmployment.be  This test is no t yet approved or cleared by the [place] FDA and  has been authorized for detection and/or diagnosis of SARS-CoV-2 by FDA under an Emergency Use Authorization (EUA). This EUA will remain  in effect (meaning this test can be used) for the duration of the COVID-19 declaration under Section 564(b)(1) of the Act, 21 U.S.C.section 360bbb-3(b)(1), unless the authorization is terminated  or revoked sooner.       Influenza A by PCR NEGATIVE NEGATIVE Final   Influenza B by PCR NEGATIVE NEGATIVE Final    Comment: (NOTE) The Xpert Xpress SARS-CoV-2/FLU/RSV plus assay is intended as an aid in the diagnosis of influenza from Nasopharyngeal swab specimens and should not be used as a sole basis for treatment. Nasal washings and aspirates are unacceptable for Xpert Xpress SARS-CoV-2/FLU/RSV testing.  Fact Sheet for Patients: EntrepreneurPulse.com.au  Fact Sheet for Healthcare Providers: IncredibleEmployment.be  This test is not yet approved or cleared by the [place] FDA and has been authorized for detection and/or diagnosis of SARS-CoV-2 by FDA under an Emergency Use Authorization (EUA). This EUA will remain in effect (meaning this test can be used) for the duration of the COVID-19 declaration under Section 564(b)(1) of the Act, 21 U.S.C. section 360bbb-3(b)(1), unless the authorization is terminated  or revoked.  Performed at [facility], [address] [place], [place] [address]   Blood culture (routine x 2)     Status: None   Collection Time: 07/13/20 11:56 AM   Specimen: BLOOD  Result Value Ref Range Status   Specimen Description BLOOD PORTA CATH  Final   Special Requests   Final    BOTTLES DRAWN AEROBIC AND ANAEROBIC Blood Culture adequate volume   Culture   Final    NO GROWTH 5 DAYS Performed at [facility], [address] [place], [place] [address]    Report Status 07/18/2020 FINAL  Final  Blood culture (routine x 2)     Status: None   Collection Time: 07/13/20 11:56 AM   Specimen: BLOOD  Result Value Ref Range Status   Specimen Description BLOOD LEFT ANTECUBITAL  Final   Special Requests   Final    BOTTLES DRAWN AEROBIC AND ANAEROBIC Blood Culture adequate volume   Culture   Final    NO GROWTH 5 DAYS Performed at [facility], [address] [place], [place] [address]    Report Status 07/18/2020 FINAL  Final  Urine Culture     Status: None   Collection Time: 07/14/20  5:45 AM   Specimen: Urine, Random  Result Value Ref Range Status   Specimen Description   Final    URINE, RANDOM Performed at [facility], [address], [place], [place] [address]  Special Requests   Final    NONE Performed at [facility], [address] [place], [place] [address]    Culture   Final    NO GROWTH Performed at [facility], [place] [address], [place], [place] [address]    Report Status 07/15/2020 FINAL  Final     Time coordinating discharge: Over 30 minutes  SIGNED:   Ezekiel Slocumb, DO [facility] 07/20/2020, 1:09 PM   If 7PM-7AM, please contact night-coverage [email or web address]

## 2020-07-24 ENCOUNTER — Encounter: Payer: Self-pay | Admitting: Internal Medicine

## 2020-07-24 DIAGNOSIS — C50212 Malignant neoplasm of upper-inner quadrant of left female breast: Secondary | ICD-10-CM | POA: Diagnosis not present

## 2020-08-04 DEATH — deceased

## 2022-04-27 IMAGING — DX DG FOOT COMPLETE 3+V*L*
3 series · 3 of 3 positions shown · non-contrast
Comparison: None.

CLINICAL DATA: Left foot wound, initial encounter

EXAM:
LEFT FOOT - COMPLETE 3+ VIEW

[foot ap]
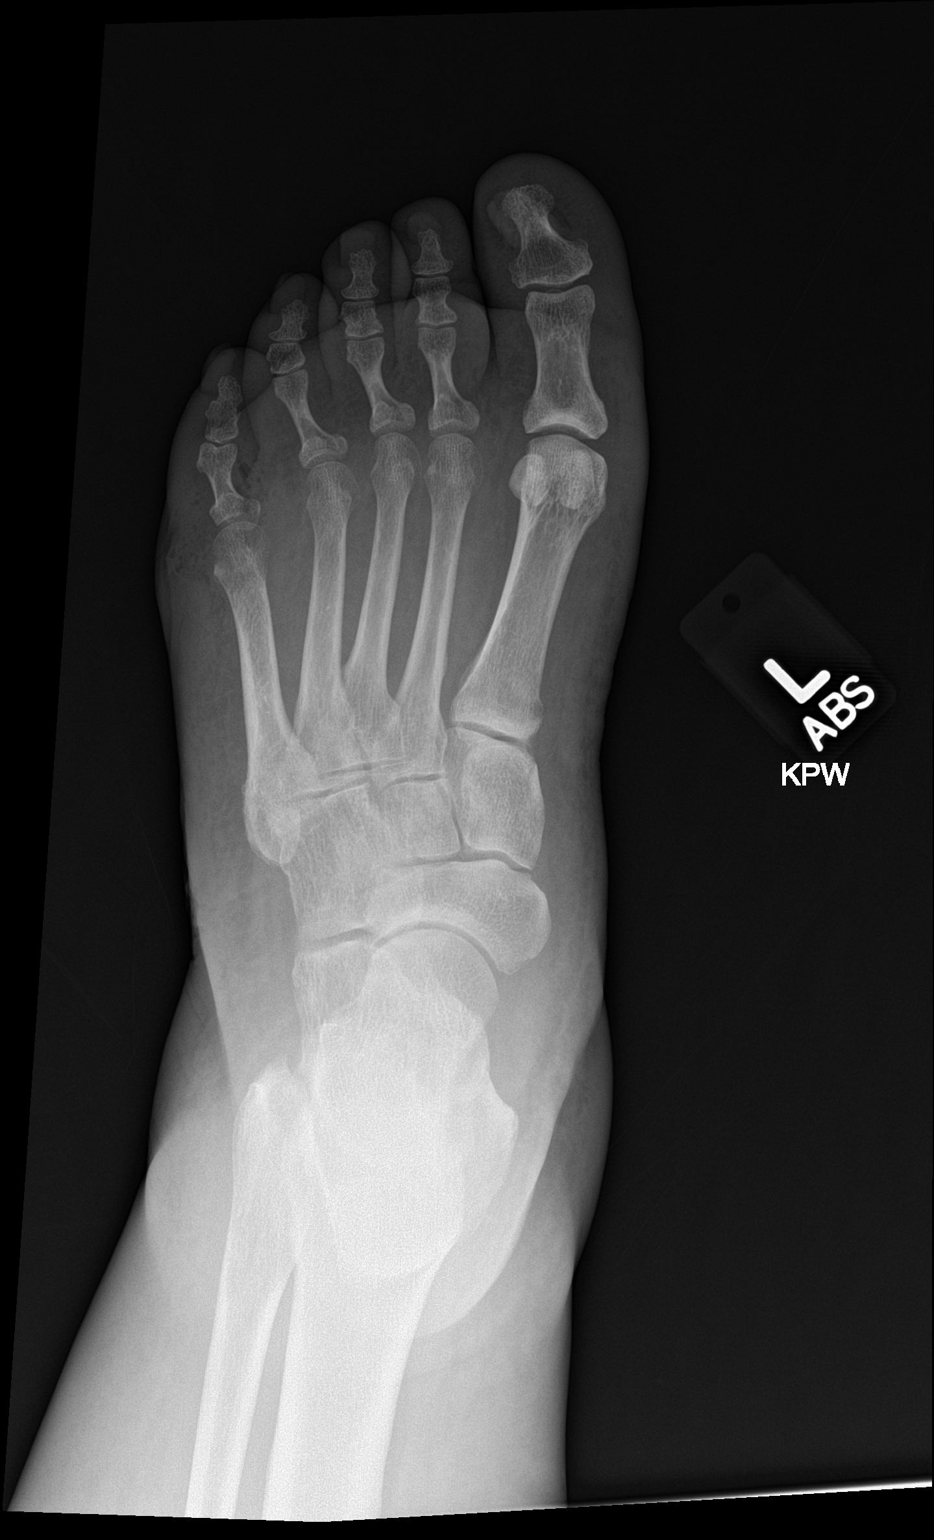

[foot obl]
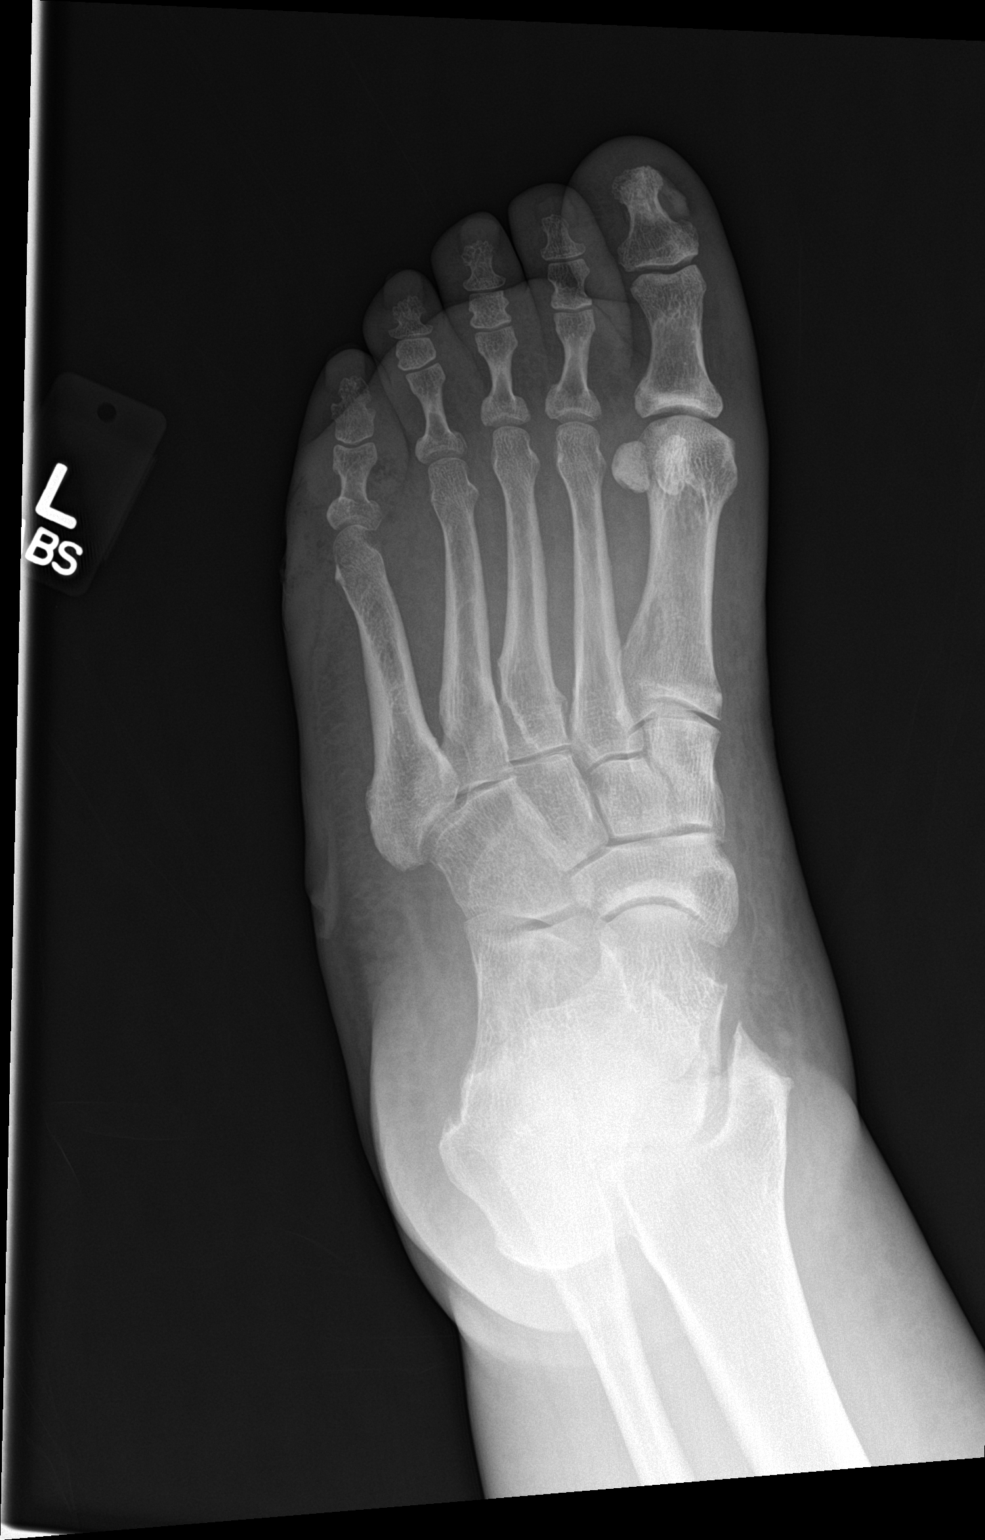

[foot lat]
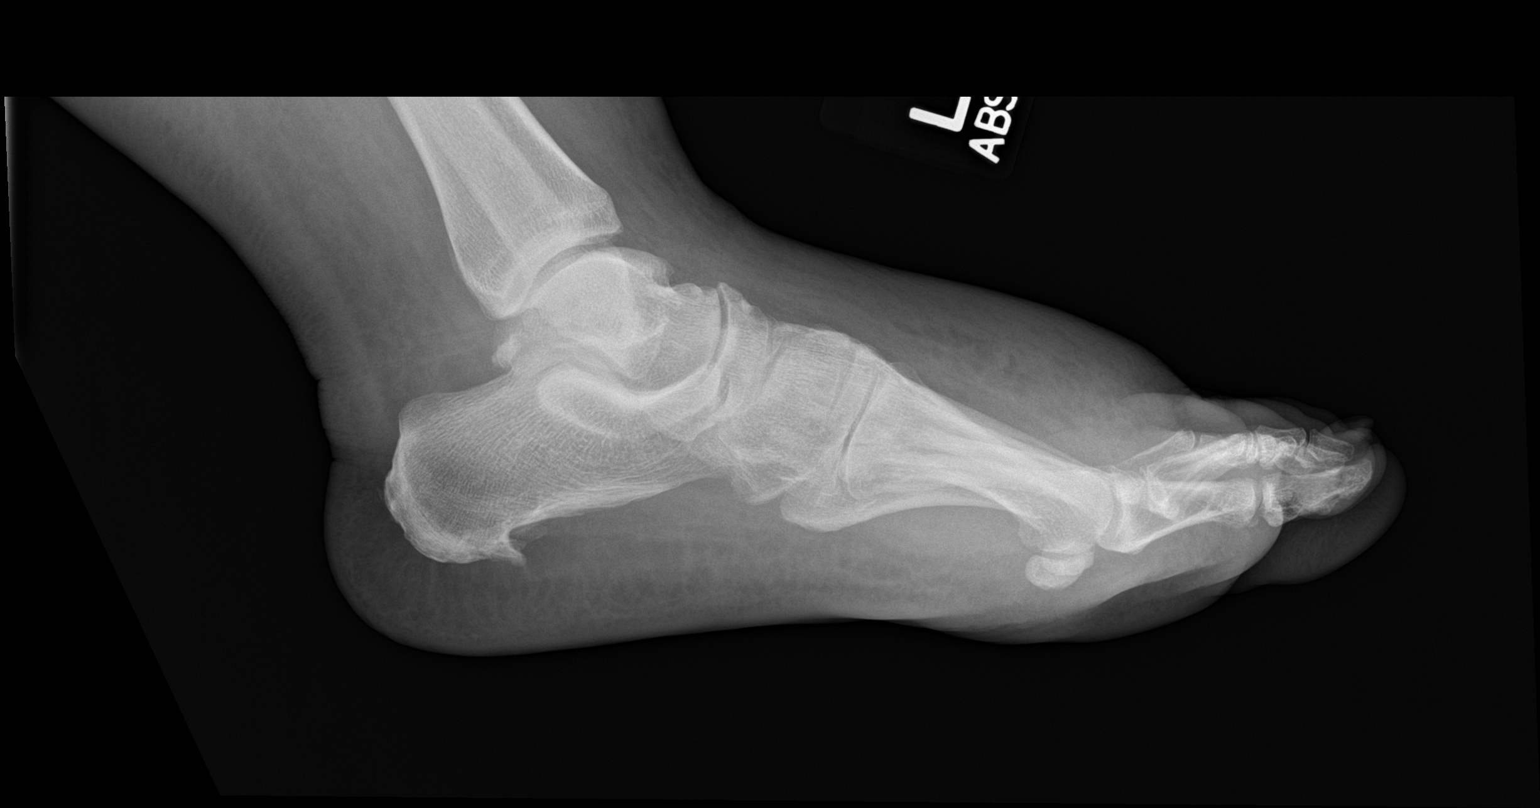

[3 of 3 positions shown; findings below may reference images not displayed]

FINDINGS: Considerable soft tissue swelling is noted in the distal aspect of
the foot. Some subcutaneous air is noted about the fifth MTP joint.
These changes correspond to the given clinical history of localized
infection. No definitive fracture or bony erosive changes are noted.
No other focal abnormality is seen.
IMPRESSION: Soft tissue swelling and subcutaneous air consistent with localized
infection predominately about the fifth MTP joint.

No definitive erosive changes are noted. If clinically indicated MRI
may be helpful for further evaluation.

## 2022-04-27 IMAGING — CR DG CHEST 2V
1 series · 2 of 2 positions shown · non-contrast
Comparison: Chest radiograph September 27, 2019; PET-CT March 28, 2020

CLINICAL DATA: Shortness of breath and dizziness.  Breast carcinoma

EXAM:
CHEST - 2 VIEW

[Series 1: dg chest 2 view · 0.14mm/px · 2 of 2 slices shown]
[im 1/2]
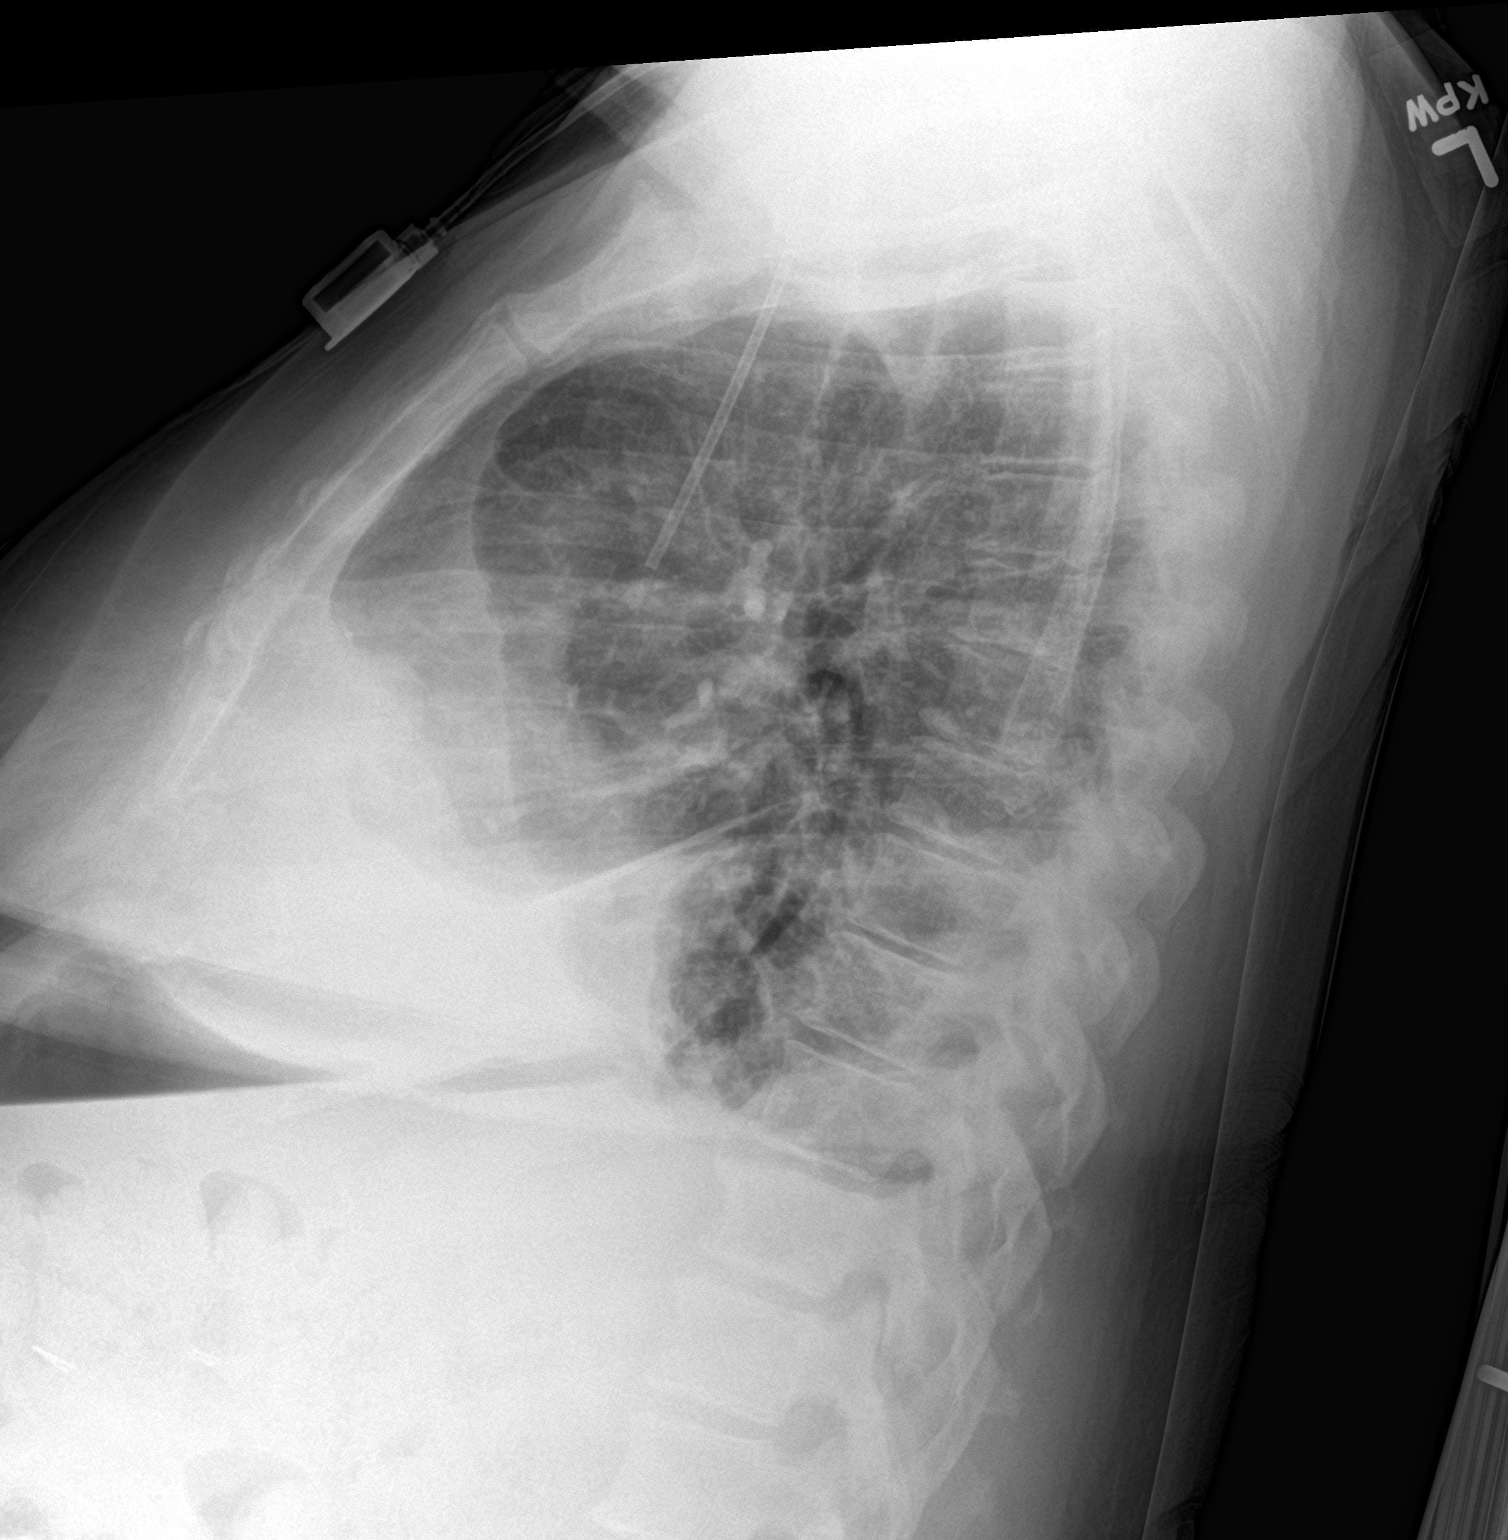
[im 2/2]
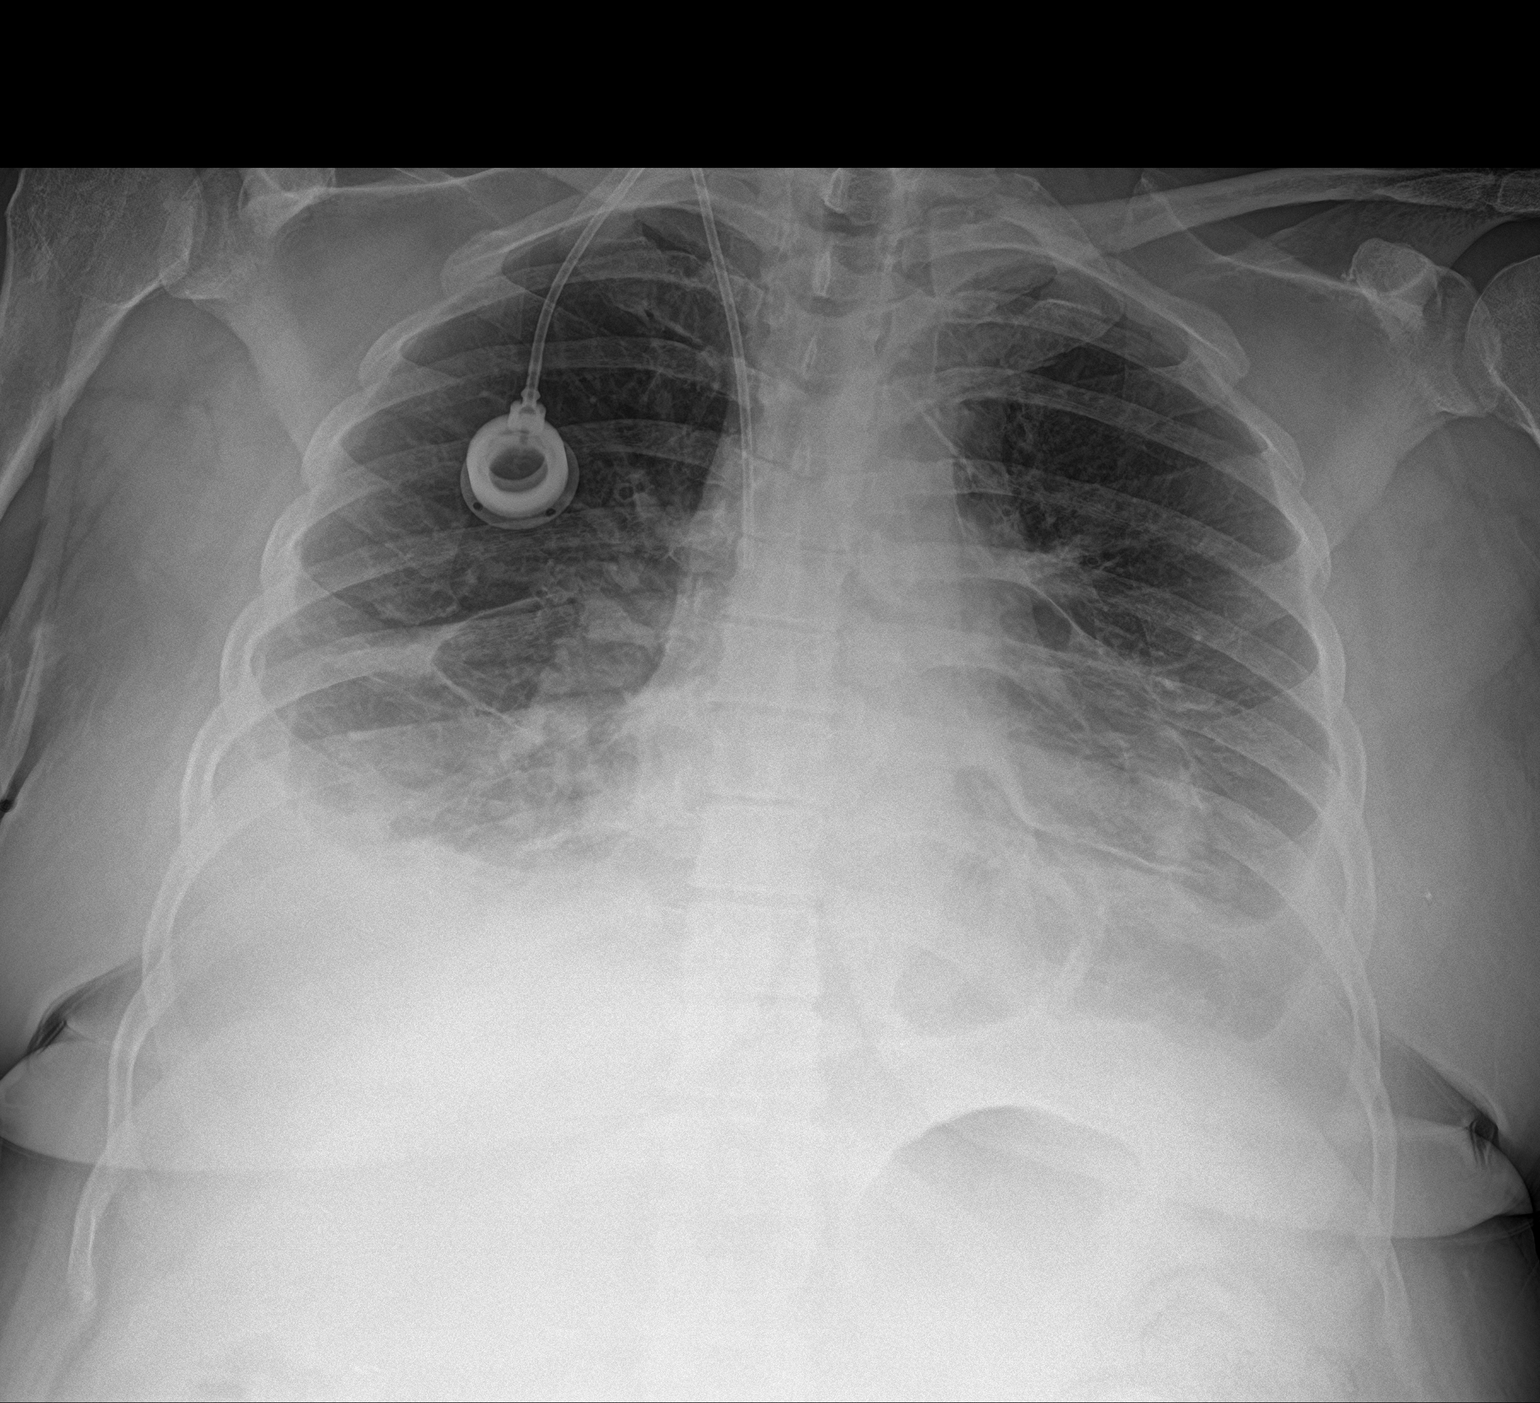

[2 of 2 positions shown; findings below may reference images not displayed]

FINDINGS: There are pleural effusions bilaterally with bibasilar atelectatic
change. The masslike area in the lingula seen on recent PET study is
appreciable by radiography measuring 1.4 x 1.3 cm. Lungs elsewhere
are clear. Heart is upper normal in size with pulmonary vascularity
normal. Port-A-Cath tip is in the superior vena cava. No adenopathy
is evident by radiography.
IMPRESSION: Stable nodular opacity in the lingula measuring 1.4 x 1.3 cm.
Bilateral pleural effusions with bibasilar atelectasis. No new
opacity evident compared to recent PET study.

Heart upper normal in size.  Port-A-Cath tip in superior vena cava.

## 2022-07-04 IMAGING — DX DG CHEST 1V PORT
1 series · 1 of 1 positions shown · non-contrast
Comparison: One-view chest x-ray 08/05/2019

CLINICAL DATA: Status post right-sided thoracentesis.

EXAM:
PORTABLE CHEST 1 VIEW

[chest ap]
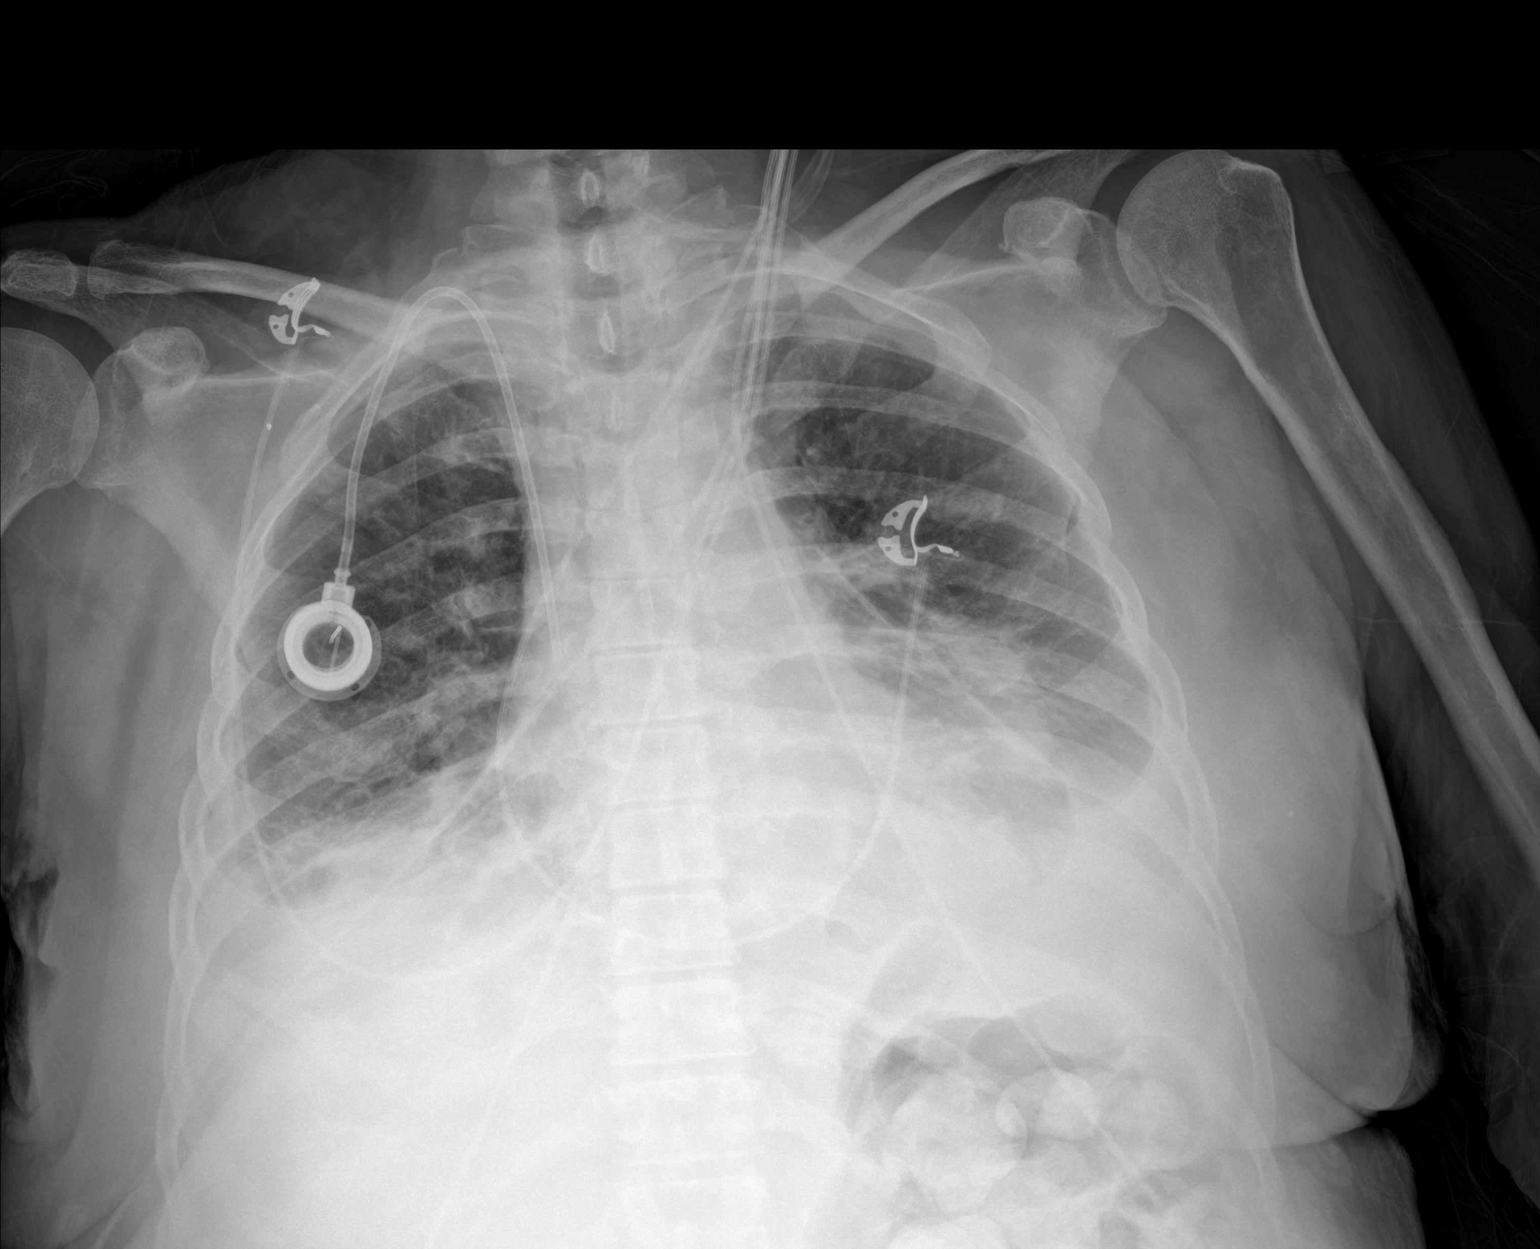

[1 of 1 positions shown; findings below may reference images not displayed]

FINDINGS: Heart is enlarged. Right IJ Port-A-Cath is stable. Right pleural
effusion has decreased. No pneumothorax is present. Left pleural
effusion persists. Bilateral associated airspace disease is present.
Mild edema is present.
IMPRESSION: 1. Decrease in right pleural effusion without pneumothorax.
2. Persistent bilateral pleural effusions and bibasilar airspace
disease.
